# Patient Record
Sex: Male | Born: 1955 | Race: White | Hispanic: No | Marital: Single | Smoking: Former smoker
Health system: Southern US, Community
[De-identification: ages and names within clinical notes are randomized; demographics above are authoritative.]

## PROBLEM LIST (undated history)

## (undated) DIAGNOSIS — C7931 Secondary malignant neoplasm of brain: Secondary | ICD-10-CM

## (undated) DIAGNOSIS — I739 Peripheral vascular disease, unspecified: Secondary | ICD-10-CM

## (undated) DIAGNOSIS — C801 Malignant (primary) neoplasm, unspecified: Secondary | ICD-10-CM

## (undated) DIAGNOSIS — R0600 Dyspnea, unspecified: Secondary | ICD-10-CM

## (undated) DIAGNOSIS — R569 Unspecified convulsions: Secondary | ICD-10-CM

## (undated) DIAGNOSIS — G4730 Sleep apnea, unspecified: Secondary | ICD-10-CM

## (undated) DIAGNOSIS — I10 Essential (primary) hypertension: Secondary | ICD-10-CM

## (undated) DIAGNOSIS — T7840XA Allergy, unspecified, initial encounter: Secondary | ICD-10-CM

## (undated) DIAGNOSIS — Z87442 Personal history of urinary calculi: Secondary | ICD-10-CM

## (undated) DIAGNOSIS — J449 Chronic obstructive pulmonary disease, unspecified: Secondary | ICD-10-CM

## (undated) DIAGNOSIS — R7303 Prediabetes: Secondary | ICD-10-CM

## (undated) DIAGNOSIS — E785 Hyperlipidemia, unspecified: Secondary | ICD-10-CM

## (undated) HISTORY — PX: CERVICAL FUSION: SHX112

## (undated) HISTORY — PX: OTHER SURGICAL HISTORY: SHX169

## (undated) HISTORY — DX: Unspecified convulsions: R56.9

## (undated) HISTORY — PX: CORONARY ANGIOPLASTY: SHX604

## (undated) HISTORY — PX: COLONOSCOPY: SHX174

## (undated) HISTORY — DX: Allergy, unspecified, initial encounter: T78.40XA

---
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
## 2008-02-24 ENCOUNTER — Ambulatory Visit: Payer: Self-pay | Admitting: Internal Medicine

## 2009-05-01 ENCOUNTER — Encounter: Admission: RE | Admit: 2009-05-01 | Discharge: 2009-05-01 | Payer: Self-pay | Admitting: Neurosurgery

## 2009-07-21 HISTORY — PX: BACK SURGERY: SHX140

## 2009-08-07 ENCOUNTER — Inpatient Hospital Stay (HOSPITAL_COMMUNITY): Admission: RE | Admit: 2009-08-07 | Discharge: 2009-08-09 | Payer: Self-pay | Admitting: Neurosurgery

## 2010-08-29 ENCOUNTER — Encounter: Payer: Self-pay | Admitting: Family Medicine

## 2010-08-29 LAB — HM COLONOSCOPY

## 2010-10-06 LAB — URINALYSIS, ROUTINE W REFLEX MICROSCOPIC
Bilirubin Urine: NEGATIVE
Hgb urine dipstick: NEGATIVE
Nitrite: NEGATIVE
Protein, ur: NEGATIVE mg/dL
Urobilinogen, UA: 0.2 mg/dL (ref 0.0–1.0)

## 2010-10-06 LAB — DIFFERENTIAL
Basophils Relative: 1 % (ref 0–1)
Eosinophils Absolute: 0.2 10*3/uL (ref 0.0–0.7)
Lymphs Abs: 2 10*3/uL (ref 0.7–4.0)
Monocytes Relative: 11 % (ref 3–12)
Neutro Abs: 3.3 10*3/uL (ref 1.7–7.7)
Neutrophils Relative %: 54 % (ref 43–77)

## 2010-10-06 LAB — CBC
HCT: 49.5 % (ref 39.0–52.0)
Hemoglobin: 16.6 g/dL (ref 13.0–17.0)
MCHC: 33.5 g/dL (ref 30.0–36.0)
RBC: 5.58 MIL/uL (ref 4.22–5.81)
RDW: 12.9 % (ref 11.5–15.5)
WBC: 6.2 10*3/uL (ref 4.0–10.5)

## 2010-10-06 LAB — PROTIME-INR
INR: 0.92 (ref 0.00–1.49)
Prothrombin Time: 12.3 seconds (ref 11.6–15.2)

## 2010-10-06 LAB — COMPREHENSIVE METABOLIC PANEL
ALT: 29 U/L (ref 0–53)
Alkaline Phosphatase: 85 U/L (ref 39–117)
BUN: 11 mg/dL (ref 6–23)
CO2: 28 mEq/L (ref 19–32)
Calcium: 9.6 mg/dL (ref 8.4–10.5)
Chloride: 104 mEq/L (ref 96–112)
GFR calc non Af Amer: >60 mL/min (ref >60)
Glucose, Bld: 81 mg/dL (ref 70–99)
Sodium: 140 mEq/L (ref 135–145)
Total Bilirubin: 0.5 mg/dL (ref 0.3–1.2)
Total Protein: 7 g/dL (ref 6.0–8.3)

## 2010-10-06 LAB — APTT: aPTT: 31 seconds (ref 24–37)

## 2014-07-03 ENCOUNTER — Ambulatory Visit: Payer: Self-pay | Admitting: Vascular Surgery

## 2014-07-03 LAB — BUN: BUN: 16 mg/dL (ref 7–18)

## 2014-07-03 LAB — CREATININE, SERUM
CREATININE: 1.34 mg/dL — AB (ref 0.60–1.30)
EGFR (Non-African Amer.): 59 — ABNORMAL LOW

## 2014-08-25 DIAGNOSIS — R7303 Prediabetes: Secondary | ICD-10-CM | POA: Insufficient documentation

## 2014-11-11 NOTE — Op Note (Signed)
PATIENT NAME:  Joshua [name], Joshua [name] MR#:  [id number] DATE OF BIRTH:  24-Oct-1955  PREOPERATIVE DIAGNOSES:  1. Peripheral arterial disease with claudication, right lower extremity.  2. Hypertension.   POSTOPERATIVE DIAGNOSES:  1. Peripheral arterial disease with claudication, right lower extremity.  2. Hypertension.   PROCEDURES:  1. Ultrasound guidance for vascular access, left femoral artery.  2. Catheter placement to right popliteal artery from left femoral approach.  3. Aortogram and selective right lower extremity angiogram.  4. Drug-coated angioplasty balloon and conventional angioplasty balloon with 6 mm diameter Lutonix and 7 mm diameter [place] to mid to distal right superficial femoral artery.  5. Self-expanding stent to the right mid to distal superficial femoral artery for greater than 50% residual stenosis and dissection after angioplasty using a 7 mm diameter self-expanding stent.  6. StarClose closure device, left femoral artery.   SURGEON: Algernon Huxley, MD   ANESTHESIA: Local with moderate conscious sedation.   ESTIMATED BLOOD LOSS: Minimal.   INDICATION FOR PROCEDURE: This is a 59 year old gentleman with lifestyle limiting claudication of the right lower extremity. He has an ABI of about 0.5 with a normal left ABI and no significant left lower extremity symptoms. Noninvasive study showed a right SFA occlusion. He desires intervention for his lifestyle limiting symptoms. The risks and benefits were discussed. Informed consent was obtained.   DESCRIPTION OF PROCEDURE: The patient was brought to the vascular suite. Groins were shaved and prepped and a sterile surgical field was created. The left femoral artery was visualized with ultrasound and found to be widely patent. It was then accessed under direct ultrasound guidance without difficulty with a Seldinger needle. The left femoral artery was accessed under direct ultrasound guidance without difficulty with a Seldinger needle and  a permanent image was recorded. A J-wire and 5 French sheath were placed. Pigtail catheter was placed in the aorta at the L1 level and AP aortogram was performed. This showed normal renal arteries. The iliac vessels were not entirely opacified due to spine hardware, but there was brisk flow through the iliac vessels, and previous noninvasive studies had shown no significant stenoses there. I then crossed the aortic bifurcation and advanced to the right femoral head without difficulty and selective right lower extremity angiogram was then performed. This demonstrated a normal common femoral artery, profunda femoris artery. The superficial femoral artery was normal proximally, however, in the mid to distal superficial femoral artery, the artery was occluded with a reconstitution at Hunter's canal distally. There was then 3 vessel runoff distally. I then placed a 6 [place] Ansell sheath over the [employer] wire, and gave the patient 4000 units of intravenous heparin. Using a Kumpe catheter and the Advantage wire, I was able to cross the occlusion without difficulty and confirm intraluminal flow in the popliteal artery below the occlusion. I then replaced the wire. A 6 mm diameter x 15 cm length Lutonix drug-coated angioplasty balloon was inflated across the lesion. When there was a suboptimal result with this, due to the large size of his vessels, I actually upsized to a 7 mm diameter angioplasty balloon. However, at the proximal entry point of the previous occlusion, there was still a dissection with a greater than 50% residual stenosis and flow that did not appear particularly brisk through there. For this reason, I elected to place a stent. A 7 mm diameter x 12 cm length LifeStent was placed in the mid to distal superficial femoral artery and post dilated with a 7 mm balloon with  an excellent angiographic completion result and no significant residual stenosis. At this point, the sheath was pulled back to the  ipsilateral external iliac artery and oblique arteriogram was performed with StarClose closure device deployed in the usual fashion with excellent hemostatic result. The patient tolerated the procedure well and was taken to the recovery room in stable condition.     ____________________________ Algernon Huxley, MD jsd:ap D: 07/03/2014 14:07:58 ET T: 07/03/2014 14:26:04 ET JOB#: [id number]  cc: Algernon Huxley, MD, <Dictator> Algernon Huxley MD ELECTRONICALLY SIGNED 07/20/2014 14:19

## 2015-01-02 ENCOUNTER — Encounter
Admission: AD | Disposition: A | Discharge status: Home / Self Care | Payer: Self-pay | Source: Ambulatory Visit | Attending: Vascular Surgery

## 2015-01-02 ENCOUNTER — Encounter: Payer: Self-pay | Admitting: *Deleted

## 2015-01-02 ENCOUNTER — Observation Stay
Admission: AD | Admit: 2015-01-02 | Discharge: 2015-01-03 | Disposition: A | Discharge status: Home / Self Care | Payer: 59 | Source: Ambulatory Visit | Attending: Vascular Surgery | Admitting: Vascular Surgery

## 2015-01-02 DIAGNOSIS — I998 Other disorder of circulatory system: Secondary | ICD-10-CM | POA: Diagnosis present

## 2015-01-02 DIAGNOSIS — L97419 Non-pressure chronic ulcer of right heel and midfoot with unspecified severity: Secondary | ICD-10-CM | POA: Insufficient documentation

## 2015-01-02 DIAGNOSIS — I70223 Atherosclerosis of native arteries of extremities with rest pain, bilateral legs: Secondary | ICD-10-CM | POA: Diagnosis not present

## 2015-01-02 DIAGNOSIS — I739 Peripheral vascular disease, unspecified: Secondary | ICD-10-CM | POA: Diagnosis present

## 2015-01-02 DIAGNOSIS — T82898A Other specified complication of vascular prosthetic devices, implants and grafts, initial encounter: Secondary | ICD-10-CM | POA: Diagnosis not present

## 2015-01-02 DIAGNOSIS — M79671 Pain in right foot: Secondary | ICD-10-CM | POA: Insufficient documentation

## 2015-01-02 DIAGNOSIS — I10 Essential (primary) hypertension: Secondary | ICD-10-CM | POA: Diagnosis not present

## 2015-01-02 DIAGNOSIS — I743 Embolism and thrombosis of arteries of the lower extremities: Secondary | ICD-10-CM | POA: Insufficient documentation

## 2015-01-02 DIAGNOSIS — X58XXXA Exposure to other specified factors, initial encounter: Secondary | ICD-10-CM | POA: Insufficient documentation

## 2015-01-02 HISTORY — DX: Essential (primary) hypertension: I10

## 2015-01-02 HISTORY — DX: Peripheral vascular disease, unspecified: I73.9

## 2015-01-02 HISTORY — PX: PERIPHERAL VASCULAR CATHETERIZATION: SHX172C

## 2015-01-02 LAB — CREATININE, SERUM
Creatinine, Ser: 1.25 mg/dL — ABNORMAL HIGH (ref 0.61–1.24)
GFR calc non Af Amer: >60 mL/min (ref >60)

## 2015-01-02 LAB — MRSA PCR SCREENING: MRSA BY PCR: NEGATIVE

## 2015-01-02 LAB — GLUCOSE, CAPILLARY: Glucose-Capillary: 88 mg/dL (ref 65–99)

## 2015-01-02 LAB — BUN: BUN: 19 mg/dL (ref 6–20)

## 2015-01-02 SURGERY — LOWER EXTREMITY ANGIOGRAPHY
Anesthesia: Moderate Sedation | Laterality: Right

## 2015-01-02 MED ORDER — MIDAZOLAM HCL 2 MG/2ML IJ SOLN
INTRAMUSCULAR | Status: DC | PRN
Start: 2015-01-02 — End: 2015-01-02
  Administered 2015-01-02: 2 mg via INTRAVENOUS
  Administered 2015-01-02: 1 mg via INTRAVENOUS
  Administered 2015-01-02: 2 mg via INTRAVENOUS
  Administered 2015-01-02 (×2): 1 mg via INTRAVENOUS

## 2015-01-02 MED ORDER — HEPARIN SODIUM (PORCINE) 1000 UNIT/ML IJ SOLN
INTRAMUSCULAR | Status: DC | PRN
  Administered 2015-01-02: 5000 [IU] via INTRAVENOUS

## 2015-01-02 MED ORDER — HEPARIN SODIUM (PORCINE) 1000 UNIT/ML IJ SOLN
INTRAMUSCULAR | Status: AC
  Filled 2015-01-02: qty 1

## 2015-01-02 MED ORDER — DOCUSATE SODIUM 100 MG PO CAPS
100.0000 mg | ORAL_CAPSULE | Freq: Every day | ORAL | Status: DC
  Administered 2015-01-03: 100 mg via ORAL
  Filled 2015-01-02: qty 1

## 2015-01-02 MED ORDER — TIROFIBAN HCL IV 5 MG/100ML
0.0750 ug/kg/min | INTRAVENOUS | Status: DC
  Administered 2015-01-02: 13:00:00 via INTRAVENOUS
  Administered 2015-01-02: 0.075 ug/kg/min via INTRAVENOUS

## 2015-01-02 MED ORDER — POLYETHYLENE GLYCOL 3350 17 G PO PACK: 17.0000 g | PACK | Freq: Every day | ORAL | Status: DC | PRN

## 2015-01-02 MED ORDER — CEFAZOLIN SODIUM 1-5 GM-% IV SOLN
INTRAVENOUS | Status: AC
  Filled 2015-01-02: qty 50

## 2015-01-02 MED ORDER — PANTOPRAZOLE SODIUM 40 MG PO TBEC
40.0000 mg | DELAYED_RELEASE_TABLET | Freq: Every day | ORAL | Status: DC
  Administered 2015-01-02 – 2015-01-03 (×2): 40 mg via ORAL
  Filled 2015-01-02 (×2): qty 1

## 2015-01-02 MED ORDER — PHENOL 1.4 % MT LIQD: 1.0000 | OROMUCOSAL | Status: DC | PRN

## 2015-01-02 MED ORDER — SODIUM CHLORIDE 0.9 % IV SOLN: 500.0000 mL | Freq: Once | INTRAVENOUS | Status: AC | PRN

## 2015-01-02 MED ORDER — HYDROMORPHONE HCL 1 MG/ML IJ SOLN: 1.0000 mg | INTRAMUSCULAR | Status: DC | PRN

## 2015-01-02 MED ORDER — LIDOCAINE HCL (PF) 1 % IJ SOLN
INTRAMUSCULAR | Status: AC
  Filled 2015-01-02: qty 10

## 2015-01-02 MED ORDER — ONDANSETRON HCL 4 MG/2ML IJ SOLN: 4.0000 mg | Freq: Four times a day (QID) | INTRAMUSCULAR | Status: DC | PRN

## 2015-01-02 MED ORDER — HYDROMORPHONE HCL 1 MG/ML IJ SOLN: 0.5000 mg | INTRAMUSCULAR | Status: DC | PRN

## 2015-01-02 MED ORDER — ACETAMINOPHEN 325 MG RE SUPP: 325.0000 mg | RECTAL | Status: DC | PRN

## 2015-01-02 MED ORDER — CLOPIDOGREL BISULFATE 75 MG PO TABS
ORAL_TABLET | ORAL | Status: AC
  Administered 2015-01-02: 75 mg via ORAL
  Filled 2015-01-02: qty 1

## 2015-01-02 MED ORDER — ALUM & MAG HYDROXIDE-SIMETH 200-200-20 MG/5ML PO SUSP: 15.0000 mL | ORAL | Status: DC | PRN

## 2015-01-02 MED ORDER — CLOPIDOGREL BISULFATE 75 MG PO TABS
75.0000 mg | ORAL_TABLET | Freq: Every day | ORAL | Status: DC
  Administered 2015-01-02: 75 mg via ORAL

## 2015-01-02 MED ORDER — TIROFIBAN HCL IV 12.5 MG/250 ML
INTRAVENOUS | Status: AC
  Filled 2015-01-02: qty 250

## 2015-01-02 MED ORDER — LISINOPRIL 10 MG PO TABS
ORAL_TABLET | ORAL | Status: AC
Start: 2015-01-02 — End: 2015-01-02
  Administered 2015-01-02: 10 mg via ORAL
  Filled 2015-01-02: qty 1

## 2015-01-02 MED ORDER — MIDAZOLAM HCL 5 MG/5ML IJ SOLN
INTRAMUSCULAR | Status: AC
  Filled 2015-01-02: qty 5

## 2015-01-02 MED ORDER — LISINOPRIL 10 MG PO TABS
10.0000 mg | ORAL_TABLET | Freq: Every day | ORAL | Status: DC
  Administered 2015-01-02 – 2015-01-03 (×2): 10 mg via ORAL
  Filled 2015-01-02: qty 1

## 2015-01-02 MED ORDER — LIDOCAINE HCL (PF) 1 % IJ SOLN
INTRAMUSCULAR | Status: DC | PRN
  Administered 2015-01-02: 10 mL via SUBCUTANEOUS

## 2015-01-02 MED ORDER — HYDRALAZINE HCL 20 MG/ML IJ SOLN: 5.0000 mg | INTRAMUSCULAR | Status: DC | PRN

## 2015-01-02 MED ORDER — FENTANYL CITRATE (PF) 100 MCG/2ML IJ SOLN
INTRAMUSCULAR | Status: AC
  Filled 2015-01-02: qty 2

## 2015-01-02 MED ORDER — ACETAMINOPHEN 325 MG PO TABS
325.0000 mg | ORAL_TABLET | ORAL | Status: DC | PRN
  Administered 2015-01-02 (×2): 650 mg via ORAL
  Filled 2015-01-02 (×2): qty 2

## 2015-01-02 MED ORDER — GUAIFENESIN-DM 100-10 MG/5ML PO SYRP: 15.0000 mL | ORAL_SOLUTION | ORAL | Status: DC | PRN

## 2015-01-02 MED ORDER — ONDANSETRON HCL 4 MG/2ML IJ SOLN: 4.0000 mg | INTRAMUSCULAR | Status: DC | PRN

## 2015-01-02 MED ORDER — MIDAZOLAM HCL 2 MG/2ML IJ SOLN
INTRAMUSCULAR | Status: AC
  Filled 2015-01-02: qty 2

## 2015-01-02 MED ORDER — MAGNESIUM SULFATE 2 GM/50ML IV SOLN
2.0000 g | Freq: Every day | INTRAVENOUS | Status: DC | PRN
  Filled 2015-01-02: qty 50

## 2015-01-02 MED ORDER — ZOLPIDEM TARTRATE 5 MG PO TABS: 5.0000 mg | ORAL_TABLET | Freq: Every evening | ORAL | Status: DC | PRN

## 2015-01-02 MED ORDER — BISACODYL 5 MG PO TBEC: 5.0000 mg | DELAYED_RELEASE_TABLET | Freq: Every day | ORAL | Status: DC | PRN

## 2015-01-02 MED ORDER — SODIUM CHLORIDE 0.9 % IV SOLN
INTRAVENOUS | Status: DC
  Administered 2015-01-02: 09:00:00 via INTRAVENOUS

## 2015-01-02 MED ORDER — ATROPINE SULFATE 0.1 MG/ML IJ SOLN: 0.5000 mg | INTRAMUSCULAR | Status: DC | PRN

## 2015-01-02 MED ORDER — OXYCODONE HCL 5 MG PO TABS: 5.0000 mg | ORAL_TABLET | ORAL | Status: DC | PRN

## 2015-01-02 MED ORDER — IOHEXOL 350 MG/ML SOLN
INTRAVENOUS | Status: DC | PRN
  Administered 2015-01-02: 100 mL via INTRA_ARTERIAL

## 2015-01-02 MED ORDER — HYDROCHLOROTHIAZIDE 12.5 MG PO CAPS
12.5000 mg | ORAL_CAPSULE | Freq: Every day | ORAL | Status: DC
  Administered 2015-01-02 – 2015-01-03 (×2): 12.5 mg via ORAL
  Filled 2015-01-02 (×2): qty 1

## 2015-01-02 MED ORDER — LABETALOL HCL 5 MG/ML IV SOLN: 10.0000 mg | INTRAVENOUS | Status: DC | PRN

## 2015-01-02 MED ORDER — METOPROLOL TARTRATE 1 MG/ML IV SOLN: 2.0000 mg | INTRAVENOUS | Status: DC | PRN

## 2015-01-02 MED ORDER — POTASSIUM CHLORIDE CRYS ER 20 MEQ PO TBCR: 20.0000 meq | EXTENDED_RELEASE_TABLET | Freq: Every day | ORAL | Status: DC | PRN

## 2015-01-02 MED ORDER — FENTANYL CITRATE (PF) 100 MCG/2ML IJ SOLN
INTRAMUSCULAR | Status: DC | PRN
  Administered 2015-01-02 (×6): 50 ug via INTRAVENOUS

## 2015-01-02 MED ORDER — HEPARIN (PORCINE) IN NACL 2-0.9 UNIT/ML-% IJ SOLN
INTRAMUSCULAR | Status: AC
  Filled 2015-01-02: qty 1000

## 2015-01-02 MED ORDER — CEFAZOLIN SODIUM 1-5 GM-% IV SOLN
1.0000 g | Freq: Once | INTRAVENOUS | Status: AC
  Administered 2015-01-02: 1 g via INTRAVENOUS

## 2015-01-02 SURGICAL SUPPLY — 28 items
BALLN ULTRVRSE 130X300X6 (BALLOONS) ×1
BALLN ULTRVRSE 3X300X150 (BALLOONS) ×2
BALLN ULTRVRSE 6X300X130 (BALLOONS) ×1
BALLN ULTRVRSE 7X200X130 (BALLOONS) ×2
BALLOON LUTONIX DCB 6X60X75 (BALLOONS) ×4 IMPLANT
BALLOON ULTRVRSE 130X300X6 (BALLOONS) ×1 IMPLANT
BALLOON ULTRVRSE 3X300X150 (BALLOONS) ×1 IMPLANT
BALLOON ULTRVRSE 7X200X130 (BALLOONS) ×1 IMPLANT
CATH 5FX125 BOWTIP (CATHETERS) ×2 IMPLANT
CATH CROSS S6 106CM (CATHETERS) ×2 IMPLANT
CATH ROYAL FLUSH PIG 5F 70CM (CATHETERS) ×2 IMPLANT
CATH SKICK ANG 70CM (CATHETERS) ×2 IMPLANT
DEVICE PRESTO INFLATION (MISCELLANEOUS) ×2 IMPLANT
DEVICE SOLENT OMNI 120CM (CATHETERS) ×2 IMPLANT
DEVICE STARCLOSE SE CLOSURE (Vascular Products) ×2 IMPLANT
GLIDEWIRE ANGLED SS 035X260CM (WIRE) ×2 IMPLANT
PACK ANGIOGRAPHY (CUSTOM PROCEDURE TRAY) ×2 IMPLANT
SET INTRO CAPELLA COAXIAL (SET/KITS/TRAYS/PACK) ×2 IMPLANT
SHEATH BRITE TIP 5FRX11 (SHEATH) ×2 IMPLANT
SHEATH RAABE 6FR (SHEATH) ×2 IMPLANT
SHEATH RAABE 7FR (SHEATH) ×2 IMPLANT
STENT VIABAHN 7X25X120 (Permanent Stent) ×2 IMPLANT
SYR MEDRAD MARK V 150ML (SYRINGE) ×2 IMPLANT
TUBING CONTRAST HIGH PRESS 72 (TUBING) ×2 IMPLANT
VALVE HEMO TOUHY BORST Y (VALVE) ×2 IMPLANT
WIRE HI TORQ VERSACORE 300 (WIRE) ×2 IMPLANT
WIRE J 3MM .035X145CM (WIRE) ×2 IMPLANT
WIRE SPARTACORE .014X300CM (WIRE) ×2 IMPLANT

## 2015-01-02 NOTE — Progress Notes (Signed)
Pt alert and oriented, complaint of leg pain relieved with tylenol, pt currently resting quietly and asleep.  NSR/SB, lungs clear on room air.  Tolerating clear liquids, voids in urinal.  VSS, afebrile.  Incision site to left groin dry and intact, PAD remains in place with 40 ml air, PAD to remain in place overnight until tomorrow morning.  Pt has aggrastat drip infusing, to remain infusing until tomorrow morning.  Able to palpate left pedal pulses, doppler right pedal pulses.

## 2015-01-02 NOTE — Plan of Care (Signed)
Problem: Consults Goal: Skin Care Protocol Initiated - if Braden Score 18 or less If consults are not indicated, leave blank or document N/A Outcome: Completed/Met Date Met:  01/02/15 Braden scale 19

## 2015-01-02 NOTE — Progress Notes (Signed)
Night of surgery note  Patient is comfortable in the ICU intravenous infusion of Aggrastat is ongoing without problems. Vital signs are stable. By examination his foot is pink and hyperemic there are weakly palpable dorsalis pedis and posterior tibial pulses noted.  Status post revascularization right lower extremity successful.  Plan will be to complete the infusion of Aggrastat and discharged tomorrow on Plavix and aspirin

## 2015-01-03 DIAGNOSIS — I743 Embolism and thrombosis of arteries of the lower extremities: Secondary | ICD-10-CM | POA: Diagnosis present

## 2015-01-03 DIAGNOSIS — I70223 Atherosclerosis of native arteries of extremities with rest pain, bilateral legs: Secondary | ICD-10-CM | POA: Diagnosis not present

## 2015-01-03 MED ORDER — CLOPIDOGREL BISULFATE 75 MG PO TABS
300.0000 mg | ORAL_TABLET | ORAL | Status: AC
  Administered 2015-01-03: 300 mg via ORAL
  Filled 2015-01-03: qty 4

## 2015-01-03 NOTE — Discharge Summary (Signed)
[facility]    Discharge Summary    Patient ID:  Joshua [name] MRN: [id number] DOB/AGE: 10/17/1955 59 y.o.  Admit date: 01/02/2015 Discharge date: 01/03/2015 Date of Surgery: 01/02/2015 Surgeon: Surgeon(s): Katha Cabal, MD  Admission Diagnosis: RT lower extrem    ASO w claudication and rest pain right leg  Discharge Diagnoses:  RT lower extrem    ASO w claudication and rest pain right leg  Secondary Diagnoses: Past Medical History  Diagnosis Date  . Hypertension   . Peripheral vascular disease     Procedure(s): Right Lower Extremity Angiography with Crosser atherectomy and percutaneous transluminal angioplasty and stent placement right SFA associated with mechanical thromboembolectomy of the distal popliteal tibioperoneal trunk and peroneal and anterior tibial  Discharged Condition: good  HPI:  Patient presents with claudication rest pain of his right lower extremity. On evaluation in the office he was noted to have loss of palpable pulses in his right foot noninvasive studies demonstrated occlusion of the previously placed stent. Given his increasing pain and inability to do his job he presents for revascularization. The risks and benefits were reviewed all questions were answered alternative therapies and treatments were reviewed patient wishes to proceed with angiography and intervention.  Hospital Course:  Joshua [name] is a 59 y.o. male is S/P Right Procedure(s): Right Lower Extremity Angiography with Crosser atherectomy and percutaneous transluminal angioplasty and stent placement right SFA associated with mechanical thromboembolectomy of the distal popliteal tibioperoneal trunk and peroneal and anterior tibial Extubated: POD # 0 Physical exam: Patient now has a pink warm right foot with a 2+ posterior tibial pulse Post-op wounds clean, dry, intact or healing well Pt. Ambulating, voiding and taking PO diet without  difficulty. Pt pain controlled with PO pain meds. Labs as below Complications: During the procedure the patient embolized thrombus from the SFA distally this was treated effectively with mechanical thrombectomy using the AngioJet. He was subsequently maintained on Aggrastat overnight and is discharged home on Plavix and aspirin the day after the procedure  Consults: None     Significant Diagnostic Studies: CBC Lab Results  Component Value Date   WBC 6.2 08/02/2009   HGB 16.6 08/02/2009   HCT 49.5 08/02/2009   MCV 88.8 08/02/2009   PLT 170 08/02/2009    BMET    Component Value Date/Time   NA 140 08/02/2009 1129   K 4.3 08/02/2009 1129   CL 104 08/02/2009 1129   CO2 28 08/02/2009 1129   GLUCOSE 81 08/02/2009 1129   BUN 19 01/02/2015 0908   BUN 16 07/03/2014 1205   CREATININE 1.25* 01/02/2015 0908   CREATININE 1.34* 07/03/2014 1205   CALCIUM 9.6 08/02/2009 1129   GFRNONAA >60 01/02/2015 0908   GFRAA >60 01/02/2015 0908   COAG Lab Results  Component Value Date   INR 0.92 08/02/2009     Disposition:  Discharge to :Home    Medication List    TAKE these medications        clopidogrel 75 MG tablet  Commonly known as:  PLAVIX  Take 75 mg by mouth daily.     hydrochlorothiazide 12.5 MG capsule  Commonly known as:  MICROZIDE  Take 12.5 mg by mouth daily.     lisinopril 10 MG tablet  Commonly known as:  PRINIVIL,ZESTRIL  Take 10 mg by mouth daily.       Verbal and written Discharge instructions given to the patient. Wound care per Discharge AVS     Follow-up  Information    Follow up with Shannia Jacuinde, Dolores Lory, MD In 3 weeks.   Specialties:  Vascular Surgery, Cardiology, Radiology, Vascular Surgery   Contact information:   [address] [place] [address] [phone]       Signed: Katha Cabal, MD  01/03/2015, 10:46 AM

## 2015-01-03 NOTE — Discharge Instructions (Signed)
1. Keep the site clean and dry with a band aide daily until it has healed. 2. Gradually increase your activity as tolerated. 3. No strenuous exercise or lifting over 10 pounds for one week 4. No driving for 24 hours 5. Avoid tub baths, hot tubs or swimming for one week. Showers are okay. 6. A bruise or small lump at the site is common. It should disappear in a few weeks. 7. Some mild discomfort at the site is common during the healing process. 8. If you bleed at the site hold firm pressure and if it does not stop call physicians office or call 911. 9. If you have severe pain, develop fever, numbness in either leg or of it becomes cold or bluish in color, call the physicians office.   Dr. Nino Parsley office number is [phone]

## 2015-01-03 NOTE — Progress Notes (Signed)
Reviewed all discharge instructions with patient and his mother at bedside. Reviewed prescriptions, follow-up appointment, and groin care instructions. Patient and mother verbalized understanding of all instructions and provided him with educational materials. IV removed, catheter intact and dressing applied, dressing to left groin also changed. No active bleeding or hematoma noted to site. Calling for wheelchair to front lobby for discharge home.

## 2015-01-03 NOTE — Plan of Care (Signed)
Problem: Consults Goal: Vascular Cath Int Patient Education (See Patient Education module for education specifics.)  Outcome: Progressing Educated patient on groin care when he goes home. No tub baths, pools, or hot tubs for a few weeks until the site has healed. He verbalized understanding and will review this information with him when he is discharged home.   Problem: Phase I Progression Outcomes Goal: Vascular site scale level 0 - I Vascular Site Scale Level 0: No bruising/bleeding/hematoma Level I (Mild): Bruising/Ecchymosis, minimal bleeding/ooozing, palpable hematoma < 3 cm Level II (Moderate): Bleeding not affecting hemodynamic parameters, pseudoaneurysm, palpable hematoma > 3 cm Level III (Severe) Bleeding which affects hemodynamic parameters or retroperitoneal hemorrhage  Outcome: Progressing Site is clean, dry, and intact with no noted hematoma or bleeding. Will continue to monitor.  Goal: Initial discharge plan identified Outcome: Progressing Discussed plans for discharge and the importance of taking his prescribed medications.

## 2015-01-03 NOTE — H&P (Signed)
[facility] History & Physical Update  The patient was interviewed and re-examined.  The patient's previous History and Physical has been reviewed and is unchanged.  There is no change in the plan of care.  Schnier, Dolores Lory, MD  01/03/2015, 11:27 AM

## 2015-01-05 ENCOUNTER — Encounter: Payer: Self-pay | Admitting: Vascular Surgery

## 2015-06-13 ENCOUNTER — Other Ambulatory Visit: Payer: Self-pay | Admitting: Vascular Surgery

## 2015-06-18 ENCOUNTER — Encounter: Payer: Self-pay | Admitting: *Deleted

## 2015-06-18 ENCOUNTER — Ambulatory Visit
Admission: RE | Admit: 2015-06-18 | Discharge: 2015-06-18 | Disposition: A | Discharge status: Home / Self Care | Payer: 59 | Source: Ambulatory Visit | Attending: Vascular Surgery | Admitting: Vascular Surgery

## 2015-06-18 ENCOUNTER — Encounter
Admission: RE | Disposition: A | Discharge status: Home / Self Care | Payer: Self-pay | Source: Ambulatory Visit | Attending: Vascular Surgery

## 2015-06-18 DIAGNOSIS — I10 Essential (primary) hypertension: Secondary | ICD-10-CM | POA: Insufficient documentation

## 2015-06-18 DIAGNOSIS — Z79899 Other long term (current) drug therapy: Secondary | ICD-10-CM | POA: Diagnosis not present

## 2015-06-18 DIAGNOSIS — Z7982 Long term (current) use of aspirin: Secondary | ICD-10-CM | POA: Diagnosis not present

## 2015-06-18 DIAGNOSIS — I70211 Atherosclerosis of native arteries of extremities with intermittent claudication, right leg: Secondary | ICD-10-CM | POA: Insufficient documentation

## 2015-06-18 DIAGNOSIS — F17200 Nicotine dependence, unspecified, uncomplicated: Secondary | ICD-10-CM | POA: Diagnosis not present

## 2015-06-18 HISTORY — PX: PERIPHERAL VASCULAR CATHETERIZATION: SHX172C

## 2015-06-18 LAB — BUN: BUN: 17 mg/dL (ref 6–20)

## 2015-06-18 LAB — CREATININE, SERUM
Creatinine, Ser: 1.14 mg/dL (ref 0.61–1.24)
GFR calc Af Amer: >60 mL/min (ref >60)

## 2015-06-18 SURGERY — LOWER EXTREMITY ANGIOGRAPHY
Anesthesia: Moderate Sedation | Laterality: Right

## 2015-06-18 MED ORDER — FENTANYL CITRATE (PF) 100 MCG/2ML IJ SOLN
INTRAMUSCULAR | Status: AC
  Filled 2015-06-18: qty 2

## 2015-06-18 MED ORDER — ONDANSETRON 4 MG PO TBDP
ORAL_TABLET | ORAL | Status: AC
  Filled 2015-06-18: qty 1

## 2015-06-18 MED ORDER — SODIUM CHLORIDE 0.9 % IV SOLN
INTRAVENOUS | Status: DC
  Administered 2015-06-18: 12:00:00 via INTRAVENOUS

## 2015-06-18 MED ORDER — LIDOCAINE HCL (PF) 1 % IJ SOLN
INTRAMUSCULAR | Status: AC
  Filled 2015-06-18: qty 30

## 2015-06-18 MED ORDER — FENTANYL CITRATE (PF) 100 MCG/2ML IJ SOLN
INTRAMUSCULAR | Status: DC | PRN
  Administered 2015-06-18: 50 ug via INTRAVENOUS
  Administered 2015-06-18: 25 ug via INTRAVENOUS
  Administered 2015-06-18 (×2): 50 ug via INTRAVENOUS

## 2015-06-18 MED ORDER — LIDOCAINE-EPINEPHRINE (PF) 1 %-1:200000 IJ SOLN
INTRAMUSCULAR | Status: DC | PRN
  Administered 2015-06-18: 10 mL via INTRADERMAL

## 2015-06-18 MED ORDER — MIDAZOLAM HCL 5 MG/5ML IJ SOLN
INTRAMUSCULAR | Status: AC
  Filled 2015-06-18: qty 5

## 2015-06-18 MED ORDER — HYDROCODONE-ACETAMINOPHEN 5-325 MG PO TABS: 1.0000 | ORAL_TABLET | Freq: Four times a day (QID) | ORAL | Status: DC | PRN

## 2015-06-18 MED ORDER — MIDAZOLAM HCL 2 MG/2ML IJ SOLN
INTRAMUSCULAR | Status: DC | PRN
  Administered 2015-06-18: 2 mg via INTRAVENOUS
  Administered 2015-06-18 (×2): 1 mg via INTRAVENOUS
  Administered 2015-06-18: 2 mg via INTRAVENOUS

## 2015-06-18 MED ORDER — ONDANSETRON HCL 4 MG PO TABS
4.0000 mg | ORAL_TABLET | Freq: Once | ORAL | Status: AC
  Administered 2015-06-18: 4 mg via ORAL

## 2015-06-18 MED ORDER — HEPARIN SODIUM (PORCINE) 1000 UNIT/ML IJ SOLN
INTRAMUSCULAR | Status: DC | PRN
  Administered 2015-06-18: 5000 [IU] via INTRAVENOUS

## 2015-06-18 MED ORDER — DEXTROSE 5 % IV SOLN
1.5000 g | INTRAVENOUS | Status: AC
  Administered 2015-06-18: 1.5 g via INTRAVENOUS
  Filled 2015-06-18: qty 1.5

## 2015-06-18 MED ORDER — IOHEXOL 300 MG/ML  SOLN
INTRAMUSCULAR | Status: DC | PRN
  Administered 2015-06-18: 85 mL via INTRA_ARTERIAL

## 2015-06-18 MED ORDER — APIXABAN 2.5 MG PO TABS
2.5000 mg | ORAL_TABLET | Freq: Two times a day (BID) | ORAL | Status: DC
  Administered 2015-06-18: 2.5 mg via ORAL
  Filled 2015-06-18 (×5): qty 1

## 2015-06-18 MED ORDER — SIMVASTATIN 20 MG PO TABS: 20.0000 mg | ORAL_TABLET | Freq: Every day | ORAL | Status: DC

## 2015-06-18 MED ORDER — HEPARIN SODIUM (PORCINE) 1000 UNIT/ML IJ SOLN
INTRAMUSCULAR | Status: AC
  Filled 2015-06-18: qty 1

## 2015-06-18 MED ORDER — HEPARIN (PORCINE) IN NACL 2-0.9 UNIT/ML-% IJ SOLN
INTRAMUSCULAR | Status: AC
  Filled 2015-06-18: qty 1000

## 2015-06-18 SURGICAL SUPPLY — 22 items
BALLN LUTONIX 5X150X130 (BALLOONS) ×3
BALLN ULTRVRSE 130X300X6 (BALLOONS) ×2
BALLN ULTRVRSE 3.5X100X150 (BALLOONS) ×3
BALLN ULTRVRSE 6X300X130 (BALLOONS) ×1
BALLOON LUTONIX 5X150X130 (BALLOONS) ×2 IMPLANT
BALLOON ULTRVRSE 130X300X6 (BALLOONS) ×2 IMPLANT
BALLOON ULTRVRSE 3.5X100X150 (BALLOONS) ×2 IMPLANT
CATH PIG 70CM (CATHETERS) ×3 IMPLANT
CATH VERT 100CM (CATHETERS) ×3 IMPLANT
DEVICE PRESTO INFLATION (MISCELLANEOUS) ×3 IMPLANT
DEVICE SOLENT OMNI 120CM (CATHETERS) ×3 IMPLANT
DEVICE STARCLOSE SE CLOSURE (Vascular Products) ×3 IMPLANT
GLIDEWIRE ADV .035X260CM (WIRE) ×3 IMPLANT
PACK ANGIOGRAPHY (CUSTOM PROCEDURE TRAY) ×3 IMPLANT
SHEATH ANL2 6FRX45 HC (SHEATH) ×3 IMPLANT
SHEATH BRITE TIP 5FRX11 (SHEATH) ×3 IMPLANT
STENT VIABAHN 6X100X120 (Permanent Stent) ×3 IMPLANT
STENT VIABAHN 6X250X120 (Permanent Stent) ×3 IMPLANT
SYR MEDRAD MARK V 150ML (SYRINGE) ×3 IMPLANT
TUBING CONTRAST HIGH PRESS 72 (TUBING) ×3 IMPLANT
WIRE G V18X300CM (WIRE) ×3 IMPLANT
WIRE J 3MM .035X145CM (WIRE) ×3 IMPLANT

## 2015-06-18 NOTE — H&P (Signed)
  [facility] History & Physical Update  The patient was interviewed and re-examined.  The patient's previous History and Physical has been reviewed and is unchanged.  There is no change in the plan of care. We plan to proceed with the scheduled procedure.  Vitoria Conyer, MD  06/18/2015, 12:20 PM

## 2015-06-18 NOTE — Op Note (Signed)
Joshua [name] Percutaneous Study/Intervention Procedural Note   Date of Surgery: 06/18/2015  Surgeon(s):Kanylah Muench   Assistants:none  Pre-operative Diagnosis: PAD with short distance claudication and acute on chronic lower extremity ischemia right lower extremity  Post-operative diagnosis: Same  Procedure(s) Performed: 1. Ultrasound guidance for vascular access left femoral artery 2. Catheter placement into right peroneal artery from left femoral approach 3. Aortogram and selective right lower extremity angiogram  4.  Catheter directed thrombolyzes with 4 mg of TPA delivered with the AngioJet Omni catheter and a power pulse spray fashion to the right SFA and popliteal arteries  5.  Mechanical rheolytic thrombectomy with the AngioJet Omni catheter to the right SFA and popliteal arteries 6. Percutaneous transluminal angioplasty of the right SFA with 6 mm diameter angioplasty balloon in the right popliteal artery with 5 mm diameter by 15 cm length Lutonix drug-coated angioplasty balloon 7. Viabahn covered stent placement 2 to the right SFA and popliteal artery for thrombus and high-grade stenosis after angioplasty using a 6 mm diameter by 25 cm length stent and a 6 mm diameter by 10 cm length stent  8.  Percutaneous transluminal angioplasty of right tibioperoneal trunk and proximal peroneal artery with 3.5 mm diameter by 10 cm length angioplasty balloon 9. StarClose closure device left femoral artery  EBL: 25 cc  Indications: Patient is a 59 year old male with recurrent right leg pain and acute on chronic right lower extremity ischemia. The patient has noninvasive study showing reocclusion of his right SFA and popliteal artery interventions. The patient is brought in for angiography for further evaluation and potential treatment. Risks and benefits are discussed and informed  consent is obtained  Procedure: The patient was identified and appropriate procedural time out was performed. The patient was then placed supine on the table and prepped and draped in the usual sterile fashion. Ultrasound was used to evaluate the left common femoral artery. It was patent . A digital ultrasound image was acquired. A Seldinger needle was used to access the left common femoral artery under direct ultrasound guidance and a permanent image was performed. A 0.035 J wire was advanced without resistance and a 5Fr sheath was placed. Pigtail catheter was placed into the aorta and an AP aortogram was performed. This demonstrated normal renal arteries and normal aorta and iliac segments without significant stenosis. I then crossed the aortic bifurcation and advanced to the right femoral head and then into the proximal superficial femoral artery. Selective right lower extremity angiogram was then performed. This demonstrated occlusion of the SFA about 6-7 cm beyond its origin with reconstitution of the popliteal artery just above the knee from acute thrombosis. The patient was systemically heparinized and a 6 [place] Ansell sheath was then placed over the [employer] wire. I then used a Kumpe catheter and the advantage wire to navigate through the occlusion with no difficulty at all. Intraluminal flow was confirmed in the popliteal artery. I then replaced a 0.035 advantage wire and used the AngioJet Omni catheter to deliver 4 mg of TPA and a power pulse spray fashion to the SFA and popliteal artery. This was allowed to dwell for approximately 15 minutes. Angioplasty of the popliteal artery was performed with a 5 mm diameter by 15 cm length Lutonix drug-coated angioplasty balloon inflated to 10 atm for 1 minute. The SFA was treated with a 6 mm diameter by 30 cm length conventional angioplasty balloon inflated to 8 atm for 1 minute. Significant residual thrombus remained particularly at the proximal  and distal aspects of the previous stents and the areas just above and below the previous stents. Mechanical rheolytic thrombectomy was then performed throughout the SFA and popliteal arteries for approximately 191 cc of effluent returned with the AngioJet Omni catheter. Nonetheless, residual thrombus and stenosis remained. In addition, there is now thrombus at the tibioperoneal trunk. I treated the tibioperoneal trunk thrombus with a 3.5 mm diameter by 10 cm length angioplasty balloon inflated in the proximal peroneal artery and tibioperoneal trunk. This was after exchanging for a 0.018 wire. This resulted in resolution of the thrombus with markedly improved flow and two-vessel runoff distally. The SFA and popliteal artery residual disease was then treated with 2 Viabahn covered stents. The first was a 6 mm diameter by 25 7 m length stent and the second was a 6 mm diameter by 10 cm length stent and this band the SFA from approximately 3-4 cm beyond its origin down to the above-knee popliteal artery just above the knee. These were postdilated with 6 mm balloon with an excellent angiographic completion result and no significant residual stenosis. I elected to terminate the procedure. The sheath was removed and StarClose closure device was deployed in the left femoral artery with excellent hemostatic result. The patient was taken to the recovery room in stable condition having tolerated the procedure well.  Findings:  Aortogram:Normal aorta and iliac arteries and no significant renal artery stenosis. right Lower Extremity: Long SFA and popliteal occlusion with thrombus to the tibial vessels as well. Markedly improved after intervention.   Disposition: Patient was taken to the recovery room in stable condition having tolerated the procedure well.  Complications: None  Joshua [name] 06/18/2015 3:00 PM

## 2015-06-18 NOTE — Discharge Instructions (Signed)

## 2015-06-19 ENCOUNTER — Encounter: Payer: Self-pay | Admitting: Vascular Surgery

## 2015-10-08 ENCOUNTER — Other Ambulatory Visit
Admission: RE | Admit: 2015-10-08 | Discharge: 2015-10-08 | Disposition: A | Discharge status: Home / Self Care | Payer: BLUE CROSS/BLUE SHIELD | Source: Ambulatory Visit | Attending: Vascular Surgery | Admitting: Vascular Surgery

## 2015-10-08 ENCOUNTER — Other Ambulatory Visit: Payer: Self-pay | Admitting: Vascular Surgery

## 2015-10-08 DIAGNOSIS — I739 Peripheral vascular disease, unspecified: Secondary | ICD-10-CM | POA: Insufficient documentation

## 2015-10-08 LAB — CREATININE, SERUM
CREATININE: 1.23 mg/dL (ref 0.61–1.24)
GFR calc non Af Amer: >60 mL/min (ref >60)

## 2015-10-08 LAB — BUN: BUN: 19 mg/dL (ref 6–20)

## 2015-10-09 ENCOUNTER — Encounter: Payer: Self-pay | Admitting: Anesthesiology

## 2015-10-09 ENCOUNTER — Inpatient Hospital Stay
Admission: AD | Admit: 2015-10-09 | Discharge: 2015-10-12 | DRG: 271 | Disposition: A | Discharge status: Home / Self Care | Payer: BLUE CROSS/BLUE SHIELD | Source: Ambulatory Visit | Attending: Vascular Surgery | Admitting: Vascular Surgery

## 2015-10-09 ENCOUNTER — Encounter
Admission: AD | Disposition: A | Discharge status: Home / Self Care | Payer: Self-pay | Source: Ambulatory Visit | Attending: Vascular Surgery

## 2015-10-09 ENCOUNTER — Encounter: Payer: Self-pay | Admitting: *Deleted

## 2015-10-09 DIAGNOSIS — F17200 Nicotine dependence, unspecified, uncomplicated: Secondary | ICD-10-CM | POA: Diagnosis present

## 2015-10-09 DIAGNOSIS — I743 Embolism and thrombosis of arteries of the lower extremities: Secondary | ICD-10-CM | POA: Diagnosis present

## 2015-10-09 DIAGNOSIS — Z7982 Long term (current) use of aspirin: Secondary | ICD-10-CM

## 2015-10-09 DIAGNOSIS — I70221 Atherosclerosis of native arteries of extremities with rest pain, right leg: Principal | ICD-10-CM | POA: Diagnosis present

## 2015-10-09 DIAGNOSIS — I998 Other disorder of circulatory system: Secondary | ICD-10-CM | POA: Diagnosis present

## 2015-10-09 DIAGNOSIS — Z79899 Other long term (current) drug therapy: Secondary | ICD-10-CM | POA: Diagnosis not present

## 2015-10-09 DIAGNOSIS — Z8249 Family history of ischemic heart disease and other diseases of the circulatory system: Secondary | ICD-10-CM

## 2015-10-09 DIAGNOSIS — Z833 Family history of diabetes mellitus: Secondary | ICD-10-CM

## 2015-10-09 DIAGNOSIS — I10 Essential (primary) hypertension: Secondary | ICD-10-CM | POA: Diagnosis present

## 2015-10-09 DIAGNOSIS — Z7901 Long term (current) use of anticoagulants: Secondary | ICD-10-CM | POA: Diagnosis not present

## 2015-10-09 HISTORY — PX: PERIPHERAL VASCULAR CATHETERIZATION: SHX172C

## 2015-10-09 LAB — CBC
HCT: 39.8 % — ABNORMAL LOW (ref 40.0–52.0)
HEMATOCRIT: 39.2 % — AB (ref 40.0–52.0)
HEMOGLOBIN: 13.2 g/dL (ref 13.0–18.0)
Hemoglobin: 13 g/dL (ref 13.0–18.0)
MCH: 28.5 pg (ref 26.0–34.0)
MCH: 28.6 pg (ref 26.0–34.0)
MCHC: 33.1 g/dL (ref 32.0–36.0)
MCHC: 33.1 g/dL (ref 32.0–36.0)
MCV: 85.9 fL (ref 80.0–100.0)
MCV: 86.5 fL (ref 80.0–100.0)
PLATELETS: 144 10*3/uL — AB (ref 150–440)
Platelets: 140 10*3/uL — ABNORMAL LOW (ref 150–440)
RBC: 4.63 MIL/uL (ref 4.40–5.90)
RBC: 4.53 MIL/uL (ref 4.40–5.90)
RDW: 13.4 % (ref 11.5–14.5)
RDW: 13 % (ref 11.5–14.5)
WBC: 6.8 10*3/uL (ref 3.8–10.6)
WBC: 8.1 10*3/uL (ref 3.8–10.6)

## 2015-10-09 LAB — FIBRINOGEN
Fibrinogen: 255 mg/dL (ref 210–470)
Fibrinogen: 229 mg/dL (ref 210–470)

## 2015-10-09 LAB — PROTIME-INR
INR: 1.15
PROTHROMBIN TIME: 14.9 s (ref 11.4–15.0)

## 2015-10-09 LAB — HEPARIN LEVEL (UNFRACTIONATED)

## 2015-10-09 LAB — APTT: aPTT: 40 seconds — ABNORMAL HIGH (ref 24–36)

## 2015-10-09 LAB — MRSA PCR SCREENING: MRSA by PCR: NEGATIVE

## 2015-10-09 SURGERY — ABDOMINAL AORTOGRAM W/LOWER EXTREMITY
Laterality: Right | Wound class: Clean

## 2015-10-09 MED ORDER — ONDANSETRON HCL 4 MG/2ML IJ SOLN: 4.0000 mg | Freq: Four times a day (QID) | INTRAMUSCULAR | Status: DC | PRN

## 2015-10-09 MED ORDER — ALTEPLASE 2 MG IJ SOLR
INTRAMUSCULAR | Status: DC | PRN
  Administered 2015-10-09: 8 mg

## 2015-10-09 MED ORDER — SIMVASTATIN 20 MG PO TABS
20.0000 mg | ORAL_TABLET | Freq: Every day | ORAL | Status: DC
Start: 2015-10-10 — End: 2015-10-12
  Administered 2015-10-10 – 2015-10-11 (×2): 20 mg via ORAL
  Filled 2015-10-09 (×2): qty 1

## 2015-10-09 MED ORDER — MIDAZOLAM HCL 5 MG/ML IJ SOLN
1.0000 mg | Freq: Once | INTRAMUSCULAR | Status: AC
  Administered 2015-10-09: 1 mg via INTRAVENOUS

## 2015-10-09 MED ORDER — SODIUM CHLORIDE 0.9% FLUSH: 3.0000 mL | INTRAVENOUS | Status: DC | PRN

## 2015-10-09 MED ORDER — ALTEPLASE 2 MG IJ SOLR
INTRAMUSCULAR | Status: AC
  Filled 2015-10-09: qty 8

## 2015-10-09 MED ORDER — DEXTROSE 5 % IV SOLN
1.5000 g | INTRAVENOUS | Status: AC
  Administered 2015-10-09: 1.5 g via INTRAVENOUS

## 2015-10-09 MED ORDER — HEPARIN SODIUM (PORCINE) 1000 UNIT/ML IJ SOLN
INTRAMUSCULAR | Status: AC
  Filled 2015-10-09: qty 1

## 2015-10-09 MED ORDER — MORPHINE SULFATE (PF) 2 MG/ML IV SOLN
INTRAVENOUS | Status: AC
  Administered 2015-10-09: 2 mg via INTRAVENOUS
  Filled 2015-10-09: qty 1

## 2015-10-09 MED ORDER — HYDROMORPHONE HCL 1 MG/ML IJ SOLN: 1.0000 mg | Freq: Once | INTRAMUSCULAR | Status: DC

## 2015-10-09 MED ORDER — HEPARIN SODIUM (PORCINE) 1000 UNIT/ML IJ SOLN
INTRAMUSCULAR | Status: DC | PRN
  Administered 2015-10-09: 2000 [IU] via INTRAVENOUS
  Administered 2015-10-09: 5000 [IU] via INTRAVENOUS

## 2015-10-09 MED ORDER — MIDAZOLAM HCL 2 MG/2ML IJ SOLN
INTRAMUSCULAR | Status: AC
  Filled 2015-10-09: qty 2

## 2015-10-09 MED ORDER — CEFUROXIME SODIUM 1.5 G IJ SOLR
INTRAMUSCULAR | Status: AC
  Filled 2015-10-09 (×33): qty 1.5

## 2015-10-09 MED ORDER — HEPARIN (PORCINE) IN NACL 2-0.9 UNIT/ML-% IJ SOLN
INTRAMUSCULAR | Status: AC
  Filled 2015-10-09: qty 1000

## 2015-10-09 MED ORDER — MIDAZOLAM HCL 5 MG/5ML IJ SOLN
INTRAMUSCULAR | Status: AC
  Filled 2015-10-09: qty 5

## 2015-10-09 MED ORDER — FENTANYL CITRATE (PF) 100 MCG/2ML IJ SOLN
INTRAMUSCULAR | Status: AC
  Filled 2015-10-09: qty 2

## 2015-10-09 MED ORDER — FENTANYL CITRATE (PF) 100 MCG/2ML IJ SOLN
INTRAMUSCULAR | Status: AC
Start: 2015-10-09 — End: 2015-10-09
  Filled 2015-10-09: qty 2

## 2015-10-09 MED ORDER — SODIUM CHLORIDE 0.9% FLUSH
10.0000 mL | Freq: Two times a day (BID) | INTRAVENOUS | Status: DC
  Administered 2015-10-09 – 2015-10-10 (×2): 10 mL
  Administered 2015-10-11: 20 mL
  Administered 2015-10-11 – 2015-10-12 (×2): 10 mL

## 2015-10-09 MED ORDER — SODIUM CHLORIDE 0.9 % IV SOLN: 250.0000 mL | INTRAVENOUS | Status: DC | PRN

## 2015-10-09 MED ORDER — SODIUM CHLORIDE 0.9 % IV SOLN
INTRAVENOUS | Status: DC
  Administered 2015-10-10: 08:00:00 via INTRAVENOUS

## 2015-10-09 MED ORDER — MIDAZOLAM HCL 2 MG/2ML IJ SOLN
INTRAMUSCULAR | Status: DC | PRN
  Administered 2015-10-09 (×4): 1 mg via INTRAVENOUS
  Administered 2015-10-09: 2 mg via INTRAVENOUS
  Administered 2015-10-09 (×3): 1 mg via INTRAVENOUS

## 2015-10-09 MED ORDER — SODIUM CHLORIDE 0.9% FLUSH
3.0000 mL | Freq: Two times a day (BID) | INTRAVENOUS | Status: DC
  Administered 2015-10-09 – 2015-10-11 (×3): 3 mL via INTRAVENOUS

## 2015-10-09 MED ORDER — SODIUM CHLORIDE FLUSH 0.9 % IV SOLN
INTRAVENOUS | Status: AC
  Filled 2015-10-09: qty 50

## 2015-10-09 MED ORDER — HEPARIN (PORCINE) IN NACL 100-0.45 UNIT/ML-% IJ SOLN: 1350.0000 [IU]/h | INTRAMUSCULAR | Status: DC

## 2015-10-09 MED ORDER — SODIUM CHLORIDE 0.9 % IV SOLN
5.0000 mg | Freq: Once | INTRAVENOUS | Status: DC
  Filled 2015-10-09: qty 5

## 2015-10-09 MED ORDER — LISINOPRIL 10 MG PO TABS
10.0000 mg | ORAL_TABLET | Freq: Every day | ORAL | Status: DC
  Administered 2015-10-11 – 2015-10-12 (×2): 10 mg via ORAL
  Filled 2015-10-09 (×2): qty 1

## 2015-10-09 MED ORDER — FAMOTIDINE 20 MG PO TABS: 40.0000 mg | ORAL_TABLET | ORAL | Status: DC | PRN

## 2015-10-09 MED ORDER — FENTANYL CITRATE (PF) 100 MCG/2ML IJ SOLN
INTRAMUSCULAR | Status: DC | PRN
  Administered 2015-10-09 (×8): 50 ug via INTRAVENOUS

## 2015-10-09 MED ORDER — METHYLPREDNISOLONE SODIUM SUCC 125 MG IJ SOLR: 125.0000 mg | INTRAMUSCULAR | Status: DC | PRN

## 2015-10-09 MED ORDER — MIDAZOLAM HCL 2 MG/2ML IJ SOLN: 1.0000 mg | INTRAMUSCULAR | Status: DC | PRN

## 2015-10-09 MED ORDER — SODIUM CHLORIDE 0.9 % IV SOLN
5.0000 mg | INTRAVENOUS | Status: DC
  Administered 2015-10-09 – 2015-10-10 (×3): 5 mg via INTRAVENOUS
  Filled 2015-10-09 (×4): qty 5

## 2015-10-09 MED ORDER — HYDROMORPHONE HCL 1 MG/ML IJ SOLN
1.0000 mg | INTRAMUSCULAR | Status: DC | PRN
  Administered 2015-10-09 – 2015-10-10 (×2): 1 mg via INTRAVENOUS
  Filled 2015-10-09 (×2): qty 1

## 2015-10-09 MED ORDER — CEFAZOLIN SODIUM 1-5 GM-% IV SOLN
1.0000 g | INTRAVENOUS | Status: DC
  Filled 2015-10-09: qty 50

## 2015-10-09 MED ORDER — LIDOCAINE HCL (PF) 1 % IJ SOLN
INTRAMUSCULAR | Status: DC | PRN
  Administered 2015-10-09: 5 mL via INTRADERMAL

## 2015-10-09 MED ORDER — MORPHINE SULFATE (PF) 4 MG/ML IV SOLN: 5.0000 mg | INTRAVENOUS | Status: DC | PRN

## 2015-10-09 MED ORDER — ASPIRIN 81 MG PO CHEW
81.0000 mg | CHEWABLE_TABLET | Freq: Every day | ORAL | Status: DC
  Administered 2015-10-11 – 2015-10-12 (×2): 81 mg via ORAL
  Filled 2015-10-09 (×2): qty 1

## 2015-10-09 MED ORDER — SODIUM CHLORIDE 0.9 % IV SOLN
INTRAVENOUS | Status: DC | PRN
  Administered 2015-10-09: 250 mL via INTRAVENOUS

## 2015-10-09 MED ORDER — SODIUM CHLORIDE 0.9 % IV BOLUS (SEPSIS)
1000.0000 mL | Freq: Once | INTRAVENOUS | Status: AC
  Administered 2015-10-09: 1000 mL via INTRAVENOUS

## 2015-10-09 MED ORDER — MIDAZOLAM HCL 2 MG/2ML IJ SOLN
INTRAMUSCULAR | Status: AC
Start: 2015-10-09 — End: 2015-10-10
  Filled 2015-10-09: qty 2

## 2015-10-09 MED ORDER — HYDROCHLOROTHIAZIDE 12.5 MG PO CAPS
12.5000 mg | ORAL_CAPSULE | Freq: Every day | ORAL | Status: DC
  Administered 2015-10-11 – 2015-10-12 (×2): 12.5 mg via ORAL
  Filled 2015-10-09 (×2): qty 1

## 2015-10-09 MED ORDER — LIDOCAINE HCL (PF) 1 % IJ SOLN
INTRAMUSCULAR | Status: AC
  Filled 2015-10-09: qty 30

## 2015-10-09 MED ORDER — HEPARIN (PORCINE) IN NACL 100-0.45 UNIT/ML-% IJ SOLN
400.0000 [IU]/h | INTRAMUSCULAR | Status: DC
  Administered 2015-10-09: 400 [IU]/h via INTRAVENOUS
  Filled 2015-10-09 (×2): qty 250

## 2015-10-09 MED ORDER — SODIUM CHLORIDE 0.9% FLUSH: 10.0000 mL | INTRAVENOUS | Status: DC | PRN

## 2015-10-09 SURGICAL SUPPLY — 26 items
BALLN DORADO 5X200X135 (BALLOONS) ×4
BALLN LUTONIX 6X150X130 (BALLOONS) ×12
BALLOON DORADO 5X200X135 (BALLOONS) ×3 IMPLANT
BALLOON LUTONIX 6X150X130 (BALLOONS) ×9 IMPLANT
CATH CROSSER 14S OTW 146CM (CATHETERS) ×4 IMPLANT
CATH INFUS 135CMX50CM (CATHETERS) ×4 IMPLANT
CATH PIG 70CM (CATHETERS) ×4 IMPLANT
CATH SIDEKICK XL ST 110CM (SHEATH) ×4 IMPLANT
CATH STS 5FR 125CM (CATHETERS) ×4 IMPLANT
DEVICE PRESTO INFLATION (MISCELLANEOUS) ×4 IMPLANT
DEVICE SOLENT OMNI 120CM (CATHETERS) ×4 IMPLANT
DEVICE TORQUE (MISCELLANEOUS) ×4 IMPLANT
GLIDEWIRE ANGLED SS 035X260CM (WIRE) ×4 IMPLANT
KIT FLOWMATE PROCEDURAL (MISCELLANEOUS) ×4 IMPLANT
PACK ANGIOGRAPHY (CUSTOM PROCEDURE TRAY) ×4 IMPLANT
SET INTRO CAPELLA COAXIAL (SET/KITS/TRAYS/PACK) ×4 IMPLANT
SHEATH ANL2 6FRX45 HC (SHEATH) ×4 IMPLANT
SHEATH BRITE TIP 5FRX11 (SHEATH) ×4 IMPLANT
SHEATH FLEXOR ANSEL2 7FRX45 (SHEATH) ×4 IMPLANT
SUT SILK 0 FSL (SUTURE) ×4 IMPLANT
SYR MEDRAD MARK V 150ML (SYRINGE) ×4 IMPLANT
TUBING CONTRAST HIGH PRESS 72 (TUBING) ×4 IMPLANT
VALVE HEMO TOUHY BORST Y (VALVE) ×4 IMPLANT
WIRE G V18X300CM (WIRE) ×4 IMPLANT
WIRE HI TORQ VERSACORE 300 (WIRE) ×4 IMPLANT
WIRE J 3MM .035X145CM (WIRE) ×4 IMPLANT

## 2015-10-09 NOTE — Progress Notes (Signed)
Dr. Delana Meyer updated on Pt's condition. Pt's SBP now in 90's and continues to have moderate bleeding around catheter. Denies any discomfort. New orders to give 1L NS bolus, change dressing, and to call back if Pt's hemoglobin has dropped 2 or more with next scheduled recheck. Will continue to monitor closely.

## 2015-10-09 NOTE — Op Note (Signed)
OPERATIVE NOTE   PROCEDURE: 1. Insertion of triple-lumen central venous catheter right IJ approach.  PRE-OPERATIVE DIAGNOSIS: Atherosclerotic occlusive disease right lower extremity with rest pain; ischemic right lower extremity  POST-OPERATIVE DIAGNOSIS: Same  SURGEON: Katha Cabal M.D.  ANESTHESIA: 1% lidocaine local infiltration  ESTIMATED BLOOD LOSS: Minimal cc  INDICATIONS:   Joshua [name] is a 60 y.o. male who presents with ischemia right lower extremity. He is undergoing thrombolysis and therefore requires appropriate IV access.  DESCRIPTION: After obtaining full informed written consent, the patient was positioned supine. The right neck was prepped and draped in a sterile fashion. Ultrasound was placed in a sterile sleeve. Ultrasound was utilized to identify the right internal jugular vein which is noted to be echolucent and compressible indicating patency. Images recorded for the permanent record. Under real-time visualization a Seldinger needle is inserted into the vein and the guidewires advanced without difficulty. Small counterincision was made at the wire insertion site. Dilators passed over the wire and the triple-lumen catheter is fed without difficulty.  All 3 lm aspirate and flush easily and are packed with heparin saline. Catheter secured to the skin of the right neck with 2-0 silk. A sterile dressing is applied with Biopatch.  COMPLICATIONS: None  CONDITION: Unchanged  Katha Cabal, M.D. [facility] Office:  [phone]   10/09/2015, 2:23 PM

## 2015-10-09 NOTE — Progress Notes (Signed)
Paged Dr Delana Meyer regarding patient fibrogin level and APTT. At this point no additional orders given.

## 2015-10-09 NOTE — Progress Notes (Signed)
ANTICOAGULATION CONSULT NOTE - Initial Consult  Pharmacy Consult for Heparin Indication: maintain sheath patency  No Known Allergies  Patient Measurements: Height: 6' (182.9 cm) Weight: 213 lb (96.616 kg) IBW/kg (Calculated) : 77.6   Vital Signs: Temp: 98.5 F (36.9 C) (03/21 0722) BP: 122/74 mmHg (03/21 1313) Pulse Rate: 58 (03/21 1313)  Labs:  Recent Labs  10/08/15 1118  CREATININE 1.23    Estimated Creatinine Clearance: 79.9 mL/min (by C-G formula based on Cr of 1.23).   Medical History: Past Medical History  Diagnosis Date  . Hypertension   . Peripheral vascular disease (HCC)     Medications:  Scheduled:  . aspirin  81 mg Oral Daily  . hydrochlorothiazide  12.5 mg Oral Daily  . lisinopril  10 mg Oral Daily  . simvastatin  20 mg Oral Daily  . sodium chloride flush  3 mL Intravenous Q12H   Infusions:  . alteplase (TPA-ACTIVASE) *DIALYSIS CATH* 5 mg infusion 5 mg (10/09/15 1148)  . heparin 400 Units/hr (10/09/15 1229)    Assessment: 60 y/o M s/p vascular procedure with heparin running to keep the sheath open.   Goal of Therapy:  DO NOT TITRATE  Monitor platelets by anticoagulation protocol: Yes   Plan:  Will continue to follow platelets and to make sure that heparin drip is discontinued once alteplase d/c.   Ulice Dash D 10/09/2015,1:46 PM

## 2015-10-09 NOTE — Progress Notes (Signed)
Patient alert and oriented. Reporting calf cramping to right leg, MD aware and versed administered per order. Pulses absent to right foot, leg cool, mottled, MD aware. Report called to Hinton Dyer, will transfer shortly.

## 2015-10-09 NOTE — Progress Notes (Signed)
S: Patient states foot is feeling better although very sore calf is no longer cramping  O: Heart rate 60 blood pressure 120/30 O2 sats 96      Right foot is hyperemic excellent Doppler signal in the posterior tibial distribution however the posterior tibials not palpable. There is some bleeding from around the catheter  A:  Thrombolysis secondary to ischemic right foot  P:  Continue thrombolysis as the patient is clearly markedly improved over this morning and thrombolysis is clearly working. We'll try to heal with the left groin ,dressing will be reinforced and this appears manageable

## 2015-10-09 NOTE — Op Note (Signed)
[facility] Percutaneous Study/Intervention Procedural Note   Date of Surgery: 10/09/2015  Surgeon:  Katha Cabal, MD.  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 Introduction catheter into right lower extremity 3rd order catheter placement  2. Contrast injection right lower extremity for distal runoff   3. Crosser atherectomy of the right SFA and popliteal arteries 4.  Percutaneous transluminal angioplasty right superficial femoral artery and popliteal             5.   Infusion of TPA for thrombolysis             6.   Angiojet mechanical thrombectomy with the Omni device              Anesthesia: Conscious sedation was administered under my direct supervision. IV Versed plus fentanyl were utilized. Continuous ECG, pulse oximetry and blood pressure was monitored throughout the entire procedure. Versed and fentanyl were utilized.  Conscious sedation was for a total of 3 hours.  Sheath: 7 [place] Ansell left common femoral  Contrast: 55 cc  Fluoroscopy Time: 15.7 minutes  Indications: Joshua [name] presents with increasing pain in his right lower extremity associated with lifestyle limiting claudication. The risks and benefits are reviewed all questions answered patient agrees to proceed.  Procedure: Joshua [name] is a 60 y.o. y.o. male who was identified and appropriate procedural time out was performed. The patient was then placed supine on the table and prepped and draped in the usual sterile fashion.   Ultrasound was placed in the sterile sleeve and the left groin was evaluated the left common femoral artery was echolucent and pulsatile indicating patency.  Image was recorded for the permanent record and under real-time visualization a  microneedle was inserted into the common femoral artery microwire followed by a micro-sheath.  A J-wire was then advanced through the micro-sheath and a  5 [place] sheath was then inserted over a J-wire. J-wire was then advanced and a 5 French pigtail catheter was positioned to just above the bifurcation and a LAO view of the pelvis was obtained.  Subsequently a pigtail catheter with the stiff angle Glidewire was used to cross the aortic bifurcation the catheter wire were advanced down into the right distal external iliac artery. Oblique view of the femoral bifurcation was then obtained and subsequently the wire was reintroduced and the pigtail catheter negotiated into the SFA representing third order catheter placement. Distal runoff was then performed.  5000 units of heparin was then given and allowed to circulate and a 6 [place] Ansell sheath was advanced up and over the bifurcation and positioned in the femoral artery  The 14 S Crosser catheter was then prepped on the field and a straight micro- catheter was advanced into the cul-de-sac of the SFA under magnified imaging in the LAO projection. Using the Crosser catheter the occlusion was negotiated and hand injection of contrast was used to verify intraluminal placement distally in the popliteal at the level of the femoral condyles.   A versa core wire was then introduced and initially a 5 x 20 Dor[place] balloon was used to serially angioplasty the popliteal and SFA. 3 inflations were required each inflation was to 16 atm for 2 minutes. Follow-up imaging demonstrated no flow within the SFA no different from prior to the intervention. Therefore a 6 x 15 Lutonix balloon was advanced across the distal aspect of the  lesion subsequent to more 6 x 15 Lutonix balloons were used in a serial fashion all inflations were to 14 atm for 2 minutes. Follow-up imaging did not demonstrate any forward flow in the SFA.   TPA was then reconstituted and a Omni AngioJet was used  to lace the entire length of the occlusion with 8 milligrams of TPA. TPA was then allowed to dwell for 30 minutes. Follow-up imaging demonstrated incomplete thrombolysis with minimal improvement and therefore the AngioJet catheter was used to achieve a more thorough thrombectomy. Multiple passes were made after which follow-up imaging was obtained. At this point image did demonstrate forward flow but there is a large burden of thrombus present within the SFA and popliteal as well as thrombus now near the trifurcation.  Based on this finding I elected to initiate continuous TPA infusion overnight. A 50 cm infusion length catheter was then positioned in the tibioperoneal trunk extending back into the common femoral. Pharmacy was contacted and a TPA infusion of 0.5 mg/h has been initiated. The patient will be rechecked tomorrow morning.    Findings: The distal aorta itself has diffuse disease and no hemodynamically significant lesions are noted in the common and external iliac arteries which are widely patent bilaterally.  The right common femoral is widely patent as is the profunda femoris.  The SFA does indeed have an occlusion beginning several millimeters from its origin which extends down to the mid popliteal at the level of the femoral condyles. The previously placed stents are involved within this thrombosis.  The distal popliteal and tibioperoneal trunk appears patent trifurcation is patent however anterior tibial is somewhat small and seems to be disease throughout its entire course. Tibioperoneal trunk and posterior tibial are widely patent and dominant runoff to the foot. Peroneal is small but patent down to the ankle.  Following angioplasty there is no improvement in the forward flow. Following initial TPA and then AngioJet there is now forward flow but there remains a heavy burden of thrombus noted throughout the SFA and distal popliteal. As noted above patient will be undergoing thrombolysis  overnight.   Disposition: Patient was taken to the ICU in stable condition having tolerated the procedure well.  Joshua [name], Joshua [name] 10/09/2015,2:12 PM

## 2015-10-09 NOTE — Progress Notes (Signed)
Dr Delana Meyer evaluated patient at change of shift. Moderate blood drainage. Orders to place foam surgical tape and reinforce.

## 2015-10-10 ENCOUNTER — Encounter: Payer: Self-pay | Admitting: Vascular Surgery

## 2015-10-10 ENCOUNTER — Encounter
Admission: AD | Disposition: A | Discharge status: Home / Self Care | Payer: Self-pay | Source: Ambulatory Visit | Attending: Vascular Surgery

## 2015-10-10 HISTORY — PX: PERIPHERAL VASCULAR CATHETERIZATION: SHX172C

## 2015-10-10 LAB — CBC
HCT: 35.4 % — ABNORMAL LOW (ref 40.0–52.0)
HCT: 35.4 % — ABNORMAL LOW (ref 40.0–52.0)
Hemoglobin: 11.8 g/dL — ABNORMAL LOW (ref 13.0–18.0)
Hemoglobin: 11.9 g/dL — ABNORMAL LOW (ref 13.0–18.0)
MCH: 28.3 pg (ref 26.0–34.0)
MCH: 28.7 pg (ref 26.0–34.0)
MCHC: 33.2 g/dL (ref 32.0–36.0)
MCHC: 33.6 g/dL (ref 32.0–36.0)
MCV: 85.6 fL (ref 80.0–100.0)
MCV: 85.4 fL (ref 80.0–100.0)
PLATELETS: 136 10*3/uL — AB (ref 150–440)
Platelets: 133 10*3/uL — ABNORMAL LOW (ref 150–440)
RBC: 4.14 MIL/uL — AB (ref 4.40–5.90)
RBC: 4.15 MIL/uL — ABNORMAL LOW (ref 4.40–5.90)
RDW: 12.9 % (ref 11.5–14.5)
RDW: 13.4 % (ref 11.5–14.5)
WBC: 7.3 10*3/uL (ref 3.8–10.6)
WBC: 7.9 10*3/uL (ref 3.8–10.6)

## 2015-10-10 LAB — BASIC METABOLIC PANEL
Anion gap: 10 (ref 5–15)
BUN: 25 mg/dL — AB (ref 6–20)
CALCIUM: 8.3 mg/dL — AB (ref 8.9–10.3)
CO2: 21 mmol/L — ABNORMAL LOW (ref 22–32)
Chloride: 102 mmol/L (ref 101–111)
Creatinine, Ser: 1.18 mg/dL (ref 0.61–1.24)
GFR calc Af Amer: >60 mL/min (ref >60)
GFR calc non Af Amer: >60 mL/min (ref >60)
GLUCOSE: 95 mg/dL (ref 65–99)
Potassium: 4.4 mmol/L (ref 3.5–5.1)
Sodium: 133 mmol/L — ABNORMAL LOW (ref 135–145)

## 2015-10-10 LAB — FIBRINOGEN
Fibrinogen: 169 mg/dL — ABNORMAL LOW (ref 210–470)
Fibrinogen: 134 mg/dL — ABNORMAL LOW (ref 210–470)

## 2015-10-10 LAB — HEPARIN LEVEL (UNFRACTIONATED): Heparin Unfractionated: <0.1 IU/mL — ABNORMAL LOW (ref 0.30–0.70)

## 2015-10-10 SURGERY — LOWER EXTREMITY ANGIOGRAPHY
Anesthesia: Moderate Sedation | Laterality: Right

## 2015-10-10 MED ORDER — APIXABAN 5 MG PO TABS
5.0000 mg | ORAL_TABLET | Freq: Two times a day (BID) | ORAL | Status: DC
  Administered 2015-10-11: 5 mg via ORAL
  Filled 2015-10-10: qty 1

## 2015-10-10 MED ORDER — HEPARIN SODIUM (PORCINE) 1000 UNIT/ML IJ SOLN
INTRAMUSCULAR | Status: AC
  Filled 2015-10-10: qty 1

## 2015-10-10 MED ORDER — TIROFIBAN HCL IN NACL 5-0.9 MG/100ML-% IV SOLN
0.1500 ug/kg/min | INTRAVENOUS | Status: DC
  Administered 2015-10-10 – 2015-10-11 (×4): 0.15 ug/kg/min via INTRAVENOUS
  Filled 2015-10-10 (×4): qty 100

## 2015-10-10 MED ORDER — ACETAMINOPHEN 325 MG PO TABS: 650.0000 mg | ORAL_TABLET | ORAL | Status: DC | PRN

## 2015-10-10 MED ORDER — LIDOCAINE-EPINEPHRINE (PF) 1 %-1:200000 IJ SOLN
INTRAMUSCULAR | Status: AC
  Filled 2015-10-10: qty 30

## 2015-10-10 MED ORDER — DEXTROSE 5 % IV SOLN
INTRAVENOUS | Status: AC
  Filled 2015-10-10 (×28): qty 1.5

## 2015-10-10 MED ORDER — MIDAZOLAM HCL 5 MG/5ML IJ SOLN
INTRAMUSCULAR | Status: AC
  Filled 2015-10-10: qty 5

## 2015-10-10 MED ORDER — HEPARIN SODIUM (PORCINE) 1000 UNIT/ML IJ SOLN
INTRAMUSCULAR | Status: DC | PRN
  Administered 2015-10-10: 3000 [IU] via INTRAVENOUS

## 2015-10-10 MED ORDER — FENTANYL CITRATE (PF) 100 MCG/2ML IJ SOLN
INTRAMUSCULAR | Status: DC | PRN
  Administered 2015-10-10 (×3): 50 ug via INTRAVENOUS

## 2015-10-10 MED ORDER — FENTANYL CITRATE (PF) 100 MCG/2ML IJ SOLN
INTRAMUSCULAR | Status: AC
  Filled 2015-10-10: qty 2

## 2015-10-10 MED ORDER — TIROFIBAN HCL IV 12.5 MG/250 ML: INTRAVENOUS | Status: DC | PRN

## 2015-10-10 MED ORDER — TIROFIBAN HCL IV 12.5 MG/250 ML: 0.1500 ug/kg/min | INTRAVENOUS | Status: DC

## 2015-10-10 MED ORDER — IOHEXOL 300 MG/ML  SOLN
INTRAMUSCULAR | Status: DC | PRN
  Administered 2015-10-10: 55 mL via INTRA_ARTERIAL

## 2015-10-10 MED ORDER — SODIUM CHLORIDE 0.9 % IV SOLN
INTRAVENOUS | Status: DC
  Administered 2015-10-10 – 2015-10-11 (×4): via INTRAVENOUS

## 2015-10-10 MED ORDER — HEPARIN (PORCINE) IN NACL 2-0.9 UNIT/ML-% IJ SOLN
INTRAMUSCULAR | Status: AC
  Filled 2015-10-10: qty 1000

## 2015-10-10 MED ORDER — MIDAZOLAM HCL 2 MG/2ML IJ SOLN
INTRAMUSCULAR | Status: DC | PRN
  Administered 2015-10-10: 2 mg via INTRAVENOUS
  Administered 2015-10-10 (×2): 1 mg via INTRAVENOUS

## 2015-10-10 MED ORDER — TIROFIBAN (AGGRASTAT) BOLUS VIA INFUSION
25.0000 ug/kg | Freq: Once | INTRAVENOUS | Status: AC
  Administered 2015-10-10: 2415 ug via INTRAVENOUS
  Filled 2015-10-10: qty 49

## 2015-10-10 SURGICAL SUPPLY — 7 items
BALLN ULTRVRSE 3X300X130 (BALLOONS) ×2
BALLOON ULTRVRSE 3X300X130 (BALLOONS) ×1 IMPLANT
DEVICE PRESTO INFLATION (MISCELLANEOUS) ×2 IMPLANT
DEVICE SOLENT OMNI 120CM (CATHETERS) ×2 IMPLANT
DEVICE STARCLOSE SE CLOSURE (Vascular Products) ×2 IMPLANT
GLIDEWIRE ADV .035X260CM (WIRE) ×2 IMPLANT
PACK ANGIOGRAPHY (CUSTOM PROCEDURE TRAY) ×2 IMPLANT

## 2015-10-10 NOTE — Progress Notes (Signed)
Report given to Bradford, Therapist, sports.  A&Ox4.  Denies pain. NSR per cardiac monitor. No respiratory distress.  Tolerating diet. Urine has mostly cleared and is yellow/pink tinged at this time. Left groin site is free of complications with PAD intact.  +1 palpable right pedal pulse and +2 palpable left pedal pulse.

## 2015-10-10 NOTE — Progress Notes (Signed)
RN notified Dr. Delana Meyer that patient's urine is burgundy and that the fibrinogen this morning at 0530 was 134. Dr. Delana Meyer acknowledged stating he is aware and gave no new orders.

## 2015-10-10 NOTE — H&P (Signed)
  [facility] History & Physical Update  The patient was interviewed and re-examined.  The patient's previous History and Physical has been reviewed and is unchanged.  There is no change in the plan of care. We plan to proceed with the scheduled procedure.  DEW,JASON, MD  10/10/2015, 7:52 AM

## 2015-10-10 NOTE — Progress Notes (Signed)
Patient sitting up in bed. Left groin site free of complications. +1 palpable pulse to right pedis. +2 palpable pulse to left pedis. Patient ordered lunch and waiting for it to arrive.

## 2015-10-10 NOTE — Op Note (Signed)
[facility] Percutaneous Study/Intervention Procedural Note   Date of Surgery: 10/10/2015  Surgeon(s):Angelamarie Avakian   Assistants:none  Pre-operative Diagnosis: PAD with rest pain right lower extremity, status post catheter placement for overnight thrombolysis  Post-operative diagnosis: Same  Procedure(s) Performed: 1. Angiogram of right lower extremity 2. Catheter placement into right posterior tibial artery from left femoral approach 3. Mechanical rheolytic thrombectomy the AngioJet on the catheter to the right common femoral artery, superficial femoral artery, tibioperoneal trunk, and posterior tibial artery for approximately 150 cc of effluent returned 4. Percutaneous transluminal angioplasty of right posterior tibial artery with 3 mm diameter by 30 cm length angioplasty balloon 5. StarClose closure device left femoral artery  EBL: Minimal  Contrast: 55 cc  Fluro Time: 3.6 minutes  Moderate Conscious Sedation Time: approximately 45 minutes using 4 mg of Versed and 150 mcg of Fentanyl  Indications: Patient is a 60 y.o.male with rest pain of the right foot with thrombosis of previous interventions. He had an angiogram yesterday and had a catheter placement for overnight lysis. He is now back for second look angiography and further attempts at revascularization. The patient is brought in for angiography for further evaluation and potential treatment. Risks and benefits are discussed and informed consent is obtained  Procedure: The patient was identified and appropriate procedural time out was performed. The patient was then placed supine on the table and prepped and draped in the usual sterile fashion.Moderate conscious sedation was administered during a face to face encounter with the patient throughout the procedure with my supervision of the RN administering medicines and  monitoring the patient's vital signs, pulse oximetry, telemetry and mental status throughout from the start of the procedure until the patient was taken to the recovery room. The existing lysis catheter was removed and an advantage wire was placed into the posterior tibial artery. Selective right lower extremity angiogram was then performed. This demonstrated thrombus in the common femoral artery and femoral bifurcation proximally with mild thrombosis within the right SFA stent. Stenosis which appeared near occlusive in the mid segment and thrombus at 2 areas in the right posterior tibial artery which was the dominant runoff distally. The flow was still much better than his initial starting point yesterday. The patient was systemically heparinized.. I then used the AngioJet Omni catheter to treat the thrombus within the common femoral artery, superficial femoral artery, and advanced all the way down into the mid to distal posterior tibial artery. Approximately 150 cc of effluent returned. This resulted in minimal residual thrombus in the proximal SFA and profunda femoris artery with brisk flow but now the areas of stenosis within the posterior tibial artery caused very little flow to be seen in the posterior tibial artery. The peroneal artery did have some flow distally in the anterior tibial artery was occluded but reconstituted in the foot. I then took the wire into the foot through the posterior tibial artery and used 2 inflations to 12 atm for 1 minute with a 3 mm diameter by 30 cm length angioplasty balloon to treat the entire posterior tibial artery into the foot. Following these angioplasties, there was brisk flow into the foot although significant spasm was seen in the posterior tibial artery. At this point, I felt we had performed successful revascularization and we will continue him on anticoagulants to keep our intervention open. I elected to terminate the procedure. The sheath was removed and StarClose  closure device was deployed in the left femoral artery with excellent hemostatic result.  The patient was taken to the recovery room in stable condition having tolerated the procedure well.  Findings:   Right Lower Extremity: Thrombus in the common femoral artery and femoral bifurcation proximally with mild thrombosis within the right SFA stent. Stenosis which appeared near occlusive in the mid segment and thrombus at 2 areas in the right posterior tibial artery which was the dominant runoff distally.   Disposition: Patient was taken to the recovery room in stable condition having tolerated the procedure well.  Complications: None  Joshua [name] 10/10/2015 9:01 AM

## 2015-10-10 NOTE — Progress Notes (Signed)
RN paged Dr. Lucky Cowboy and spoke with OR nurse Lattie Haw and Dr. Lucky Cowboy over phone and made MD aware that patient came from special procedures without aggrostat drip infusing and that order had not been released until this RN released it in ICU.  RN asked should it be running?  Dr. Lucky Cowboy stated "yes it should have already been running and did the patient get the bolus?"  This RN not aware if patient got bolus during procedure and will call Jordanna, RN in specials and ask and then call Dr. Lucky Cowboy back.

## 2015-10-10 NOTE — Progress Notes (Addendum)
This RN spoke with OR nurse Lattie Haw and Dr. Lucky Cowboy over phone and made MD aware that this RN spoke with Gwenlyn Found, RN in special procedures and that Aggrostat bolus was not given intra procedure in lab therefore patient has not had any aggrostat because drip was not released nor started prior to coming to ICU from specials.  Dr. Lucky Cowboy gave order to start aggrostat drip and give aggrostat bolus now and to start NS @ 75 cc/H.

## 2015-10-11 ENCOUNTER — Encounter: Payer: Self-pay | Admitting: Vascular Surgery

## 2015-10-11 LAB — CBC
HCT: 27.7 % — ABNORMAL LOW (ref 40.0–52.0)
HEMOGLOBIN: 9.4 g/dL — AB (ref 13.0–18.0)
MCH: 28.8 pg (ref 26.0–34.0)
MCHC: 34 g/dL (ref 32.0–36.0)
MCV: 84.6 fL (ref 80.0–100.0)
Platelets: 113 10*3/uL — ABNORMAL LOW (ref 150–440)
RBC: 3.27 MIL/uL — AB (ref 4.40–5.90)
RDW: 12.8 % (ref 11.5–14.5)
WBC: 6.6 10*3/uL (ref 3.8–10.6)

## 2015-10-11 LAB — BASIC METABOLIC PANEL
Anion gap: 5 (ref 5–15)
BUN: 20 mg/dL (ref 6–20)
CHLORIDE: 99 mmol/L — AB (ref 101–111)
CO2: 26 mmol/L (ref 22–32)
Calcium: 8.1 mg/dL — ABNORMAL LOW (ref 8.9–10.3)
Creatinine, Ser: 0.87 mg/dL (ref 0.61–1.24)
GFR calc Af Amer: >60 mL/min (ref >60)
Glucose, Bld: 167 mg/dL — ABNORMAL HIGH (ref 65–99)
POTASSIUM: 3.9 mmol/L (ref 3.5–5.1)
SODIUM: 130 mmol/L — AB (ref 135–145)

## 2015-10-11 MED ORDER — WARFARIN SODIUM 1 MG PO TABS: 7.5000 mg | ORAL_TABLET | Freq: Once | ORAL | Status: DC

## 2015-10-11 MED ORDER — WARFARIN - PHYSICIAN DOSING INPATIENT: Freq: Every day | Status: DC

## 2015-10-11 MED ORDER — HEPARIN (PORCINE) IN NACL 100-0.45 UNIT/ML-% IJ SOLN
1350.0000 [IU]/h | INTRAMUSCULAR | Status: DC
  Filled 2015-10-11: qty 250

## 2015-10-11 MED ORDER — WARFARIN SODIUM 1 MG PO TABS: 5.0000 mg | ORAL_TABLET | Freq: Every day | ORAL | Status: DC

## 2015-10-11 MED ORDER — WARFARIN SODIUM 1 MG PO TABS: 7.5000 mg | ORAL_TABLET | Freq: Every day | ORAL | Status: DC

## 2015-10-11 MED ORDER — APIXABAN 5 MG PO TABS
5.0000 mg | ORAL_TABLET | Freq: Two times a day (BID) | ORAL | Status: DC
  Administered 2015-10-11 – 2015-10-12 (×2): 5 mg via ORAL
  Filled 2015-10-11 (×2): qty 1

## 2015-10-11 NOTE — Progress Notes (Signed)
ANTICOAGULATION CONSULT NOTE - Initial Consult  Pharmacy Consult for Heparin Drip  No Known Allergies  Patient Measurements: Height: 6' (182.9 cm) Weight: 212 lb 15.4 oz (96.6 kg) IBW/kg (Calculated) : 77.6  Vital Signs: Temp: 98.6 F (37 C) (03/23 0745) Temp Source: Oral (03/23 0745) BP: 112/59 mmHg (03/23 0700) Pulse Rate: 70 (03/23 0700)  Labs:  Recent Labs  10/09/15 1442 10/09/15 1837 10/10/15 0033 10/10/15 0531 10/11/15 0903  HGB 13.2 13.0 11.8* 11.9* 9.4*  HCT 39.8* 39.2* 35.4* 35.4* 27.7*  PLT 140* 144* 133* 136* 113*  APTT 40*  --   --   --   --   LABPROT 14.9  --   --   --   --   INR 1.15  --   --   --   --   HEPARINUNFRC <0.10* <0.10* <0.10* <0.10*  --   CREATININE  --   --   --  1.18 0.87    Estimated Creatinine Clearance: 112.9 mL/min (by C-G formula based on Cr of 0.87).   Medical History: Past Medical History  Diagnosis Date  . Hypertension   . Peripheral vascular disease (HCC)     Medications:  Scheduled:  . aspirin  81 mg Oral Daily  . hydrochlorothiazide  12.5 mg Oral Daily  . lisinopril  10 mg Oral Daily  . simvastatin  20 mg Oral QPC supper  . sodium chloride flush  10-40 mL Intracatheter Q12H  . [START ON 10/13/2015] warfarin  5 mg Oral q1800  . warfarin  7.5 mg Oral ONCE-1800  . [START ON 10/12/2015] warfarin  7.5 mg Oral q1800  . Warfarin - Physician Dosing Inpatient   Does not apply q1800   Infusions:  . sodium chloride 75 mL/hr at 10/11/15 0908  . heparin      Assessment: Pharmacy consulted for heparin drip management s/p angioplasty. Patient initially started on Eliquis 3/23 and received a single am dose. Patient is also being initiated on warfarin, managed by MD.   Goal of Therapy:  Heparin level 0.3-0.7 units/ml Monitor platelets by anticoagulation protocol: Yes  aPTT 66-102s   Plan:  Will initiate patient on heparin drip at 1350 units/hr starting at 2100. Will obtain aPTT & anti-Xa at 0300 on 3/24.    Patient to  start warfarin at 1800. Will obtain INR with am labs.   Recommend bridging for minimum of 5 days and 2 consecutive INRs in range.   Pharmacy will continue to monitor and adjust per consult.    Simpson,Michael L 10/11/2015,12:32 PM

## 2015-10-11 NOTE — Care Management (Signed)
Patient was placed on Eliquis November 2016 and did not have any copay.  Says when his insurance changed in January 2017- my copay was too high and "I ain't paying it."   Pharmacy has recommeding '5mg'$  bid.  Does not recall ever using coupons for eliquis- either 30 day free trial or continued copay assist.  Called prescription to [facility] in [place].  Activated an Eliquis coupon and patient's copay will be 10 dollars a month.  Provided patient with the coupon information.

## 2015-10-11 NOTE — Progress Notes (Signed)
Telephone report called to Ovid, Rn on 1C.

## 2015-10-11 NOTE — Progress Notes (Signed)
[facility]  Daily Progress Note   Subjective  - 1,2 Day Post-Op  Patient states he feels much better, "my toes don't hurt anymore.  Last time my toes still hurt after the procedure"  Objective Filed Vitals:   10/11/15 0800 10/11/15 0900 10/11/15 1240 10/11/15 1617  BP: 118/63 109/58 117/70 127/52  Pulse: 74 78  78  Temp:   98.2 F (36.8 C) 98.7 F (37.1 C)  TempSrc:   Oral Oral  Resp: '11 16 14 16  '$ Height:      Weight:      SpO2: 100% 99% 100% 100%    Intake/Output Summary (Last 24 hours) at 10/11/15 1759 Last data filed at 10/11/15 1752  Gross per 24 hour  Intake 2318.44 ml  Output   2675 ml  Net -356.56 ml    PULM  Normal effort , no use of accessory muscles CV  No JVD, RRR Abd      No distended, nontender VASC  2+ right PT pulse left groin small hematoma right leg with 2+ edema and mild erythema consistent with hyperemic reperfusion  Laboratory CBC    Component Value Date/Time   WBC 6.6 10/11/2015 0903   HGB 9.4* 10/11/2015 0903   HCT 27.7* 10/11/2015 0903   PLT 113* 10/11/2015 0903    BMET    Component Value Date/Time   NA 130* 10/11/2015 0903   K 3.9 10/11/2015 0903   CL 99* 10/11/2015 0903   CO2 26 10/11/2015 0903   GLUCOSE 167* 10/11/2015 0903   BUN 20 10/11/2015 0903   BUN 16 07/03/2014 1205   CREATININE 0.87 10/11/2015 0903   CREATININE 1.34* 07/03/2014 1205   CALCIUM 8.1* 10/11/2015 0903   GFRNONAA >60 10/11/2015 0903   GFRNONAA 59* 07/03/2014 1205   GFRAA >60 10/11/2015 0903   GFRAA >60 07/03/2014 1205    Assessment/Planning: POD #1,2 s/p recanalization of the right SFA with thrombolysis  OK to go to floor  Up as tolerated no restrictions  I was planning on Coumadin long term but we have bee able to get Eliquis for 2 years a $10 a month which he can afford  Given the Eliquis therapy is doable plan for DC tomorrow    Katha Cabal  10/11/2015, 5:59 PM

## 2015-10-12 MED ORDER — HYDROCODONE-ACETAMINOPHEN 5-325 MG PO TABS: 1.0000 | ORAL_TABLET | Freq: Four times a day (QID) | ORAL | Status: DC | PRN

## 2015-10-12 MED ORDER — APIXABAN 5 MG PO TABS: 5.0000 mg | ORAL_TABLET | Freq: Two times a day (BID) | ORAL | Status: DC

## 2015-10-12 NOTE — Discharge Instructions (Signed)
Information on my medicine - ELIQUIS (apixaban)  This medication education was reviewed with me or my healthcare representative as part of my discharge preparation.  The pharmacist that spoke with me during my hospital stay was:  [facility]  Why was Eliquis prescribed for you? Eliquis was prescribed for you to reduce the risk of forming blood clots that can cause a stroke if you have a medical condition called atrial fibrillation (a type of irregular heartbeat) OR to reduce the risk of a blood clots forming after orthopedic surgery.  What do You need to know about Eliquis ? Take your Eliquis TWICE DAILY - one tablet in the morning and one tablet in the evening with or without food.  It would be best to take the doses about the same time each day.  If you have difficulty swallowing the tablet whole please discuss with your pharmacist how to take the medication safely.  Take Eliquis exactly as prescribed by your doctor and DO NOT stop taking Eliquis without talking to the doctor who prescribed the medication.  Stopping may increase your risk of developing a new clot or stroke.  Refill your prescription before you run out.  After discharge, you should have regular check-up appointments with your healthcare provider that is prescribing your Eliquis.  In the future your dose may need to be changed if your kidney function or weight changes by a significant amount or as you get older.  What do you do if you miss a dose? If you miss a dose, take it as soon as you remember on the same day and resume taking twice daily.  Do not take more than one dose of ELIQUIS at the same time.  Important Safety Information A possible side effect of Eliquis is bleeding. You should call your healthcare provider right away if you experience any of the following: ? Bleeding from an injury or your nose that does not stop. ? Unusual colored urine (red or dark brown) or unusual colored stools (red or  black). ? Unusual bruising for unknown reasons. ? A serious fall or if you hit your head (even if there is no bleeding).  Some medicines may interact with Eliquis and might increase your risk of bleeding or clotting while on Eliquis. To help avoid this, consult your healthcare provider or pharmacist prior to using any new prescription or non-prescription medications, including herbals, vitamins, non-steroidal anti-inflammatory drugs (NSAIDs) and supplements.  This website has more information on Eliquis (apixaban): [email or web address].

## 2015-10-12 NOTE — Plan of Care (Signed)
Problem: Safety: Goal: Ability to remain free from injury will improve Outcome: Progressing Pt free from falls, bed alarm set.     Problem: Health Behavior/Discharge Planning: Goal: Ability to manage health-related needs will improve Outcome: Progressing 1st dose of Eliquis given per MD order.   Problem: Pain Managment: Goal: General experience of comfort will improve Outcome: Progressing Pt denies pain.  Problem: Physical Regulation: Goal: Ability to maintain clinical measurements within normal limits will improve Outcome: Progressing Up with assistance.  Goal: Will remain free from infection Outcome: Progressing Pt afebrile.  Problem: Skin Integrity: Goal: Risk for impaired skin integrity will decrease Outcome: Progressing Safe guard still in place, balloon deflated.   Problem: Tissue Perfusion: Goal: Risk factors for ineffective tissue perfusion will decrease Outcome: Progressing See assessment for pulses.  Problem: Fluid Volume: Goal: Ability to maintain a balanced intake and output will improve Outcome: Progressing Pt enc to increase PO intake, IVF per MD order.   Problem: Bowel/Gastric: Goal: Will not experience complications related to bowel motility Outcome: Progressing Pt stated + BM in ICU this morning.

## 2015-10-12 NOTE — Progress Notes (Signed)
MD making rounds. Discharge orders received. IV discontinued. Prescriptions given to patient. Provided with Education Handouts. Discharge paperwork provided, explained, signed and witnessed. No unanswered questions. Discharged via wheelchair by [employer].

## 2015-10-16 NOTE — Discharge Summary (Signed)
[facility]    Discharge Summary    Patient ID:  Joshua [name] MRN: [id number] DOB/AGE: June 09, 1956 60 y.o.  Admit date: 10/09/2015 Discharge date: 10/12/2015 Date of Surgery: 10/09/2015 - 10/10/2015 Surgeon: Katha Cabal, M.D.; Algernon Huxley, M.D.  Admission Diagnosis: Rt Lower Extr ASO with Claudication  Possible Intervention Claudication and rest pain  Discharge Diagnoses:  Rt Lower Extr ASO with Claudication  Possible Intervention Claudication and rest pain  Secondary Diagnoses: Past Medical History  Diagnosis Date  . Hypertension   . Peripheral vascular disease ([place])     Procedure(s): Lower Extremity Angiography Lower Extremity Intervention  Discharged Condition: good  HPI:  Patient was admitted after angiography showed thrombus within the distal arterial system. He was subsequently initiated on thrombolysis overnight. He was returned to special procedures the next day by Dr. Lucky Cowboy where final intervention was performed. Postoperatively he did well without complication was noted to have a palpable posterior tibial pulse. And on postoperative day 3 he was felt fit for discharge.  Hospital Course:  Joshua [name] is a 60 y.o. male is S/P Right Procedure(s): Lower Extremity Angiography Lower Extremity Intervention Extubated: POD # 0 Physical exam: Palpable posterior tibial pulse right foot Post-op wounds clean, dry, intact or healing well Pt. Ambulating, voiding and taking PO diet without difficulty. Pt pain controlled with PO pain meds. Labs as below Complications:none  Consults:     Significant Diagnostic Studies: CBC Lab Results  Component Value Date   WBC 6.6 10/11/2015   HGB 9.4* 10/11/2015   HCT 27.7* 10/11/2015   MCV 84.6 10/11/2015   PLT 113* 10/11/2015    BMET    Component Value Date/Time   NA 130* 10/11/2015 0903   K 3.9 10/11/2015 0903   CL 99* 10/11/2015 0903   CO2 26 10/11/2015 0903   GLUCOSE 167*  10/11/2015 0903   BUN 20 10/11/2015 0903   BUN 16 07/03/2014 1205   CREATININE 0.87 10/11/2015 0903   CREATININE 1.34* 07/03/2014 1205   CALCIUM 8.1* 10/11/2015 0903   GFRNONAA >60 10/11/2015 0903   GFRNONAA 59* 07/03/2014 1205   GFRAA >60 10/11/2015 0903   GFRAA >60 07/03/2014 1205   COAG Lab Results  Component Value Date   INR 1.15 10/09/2015   INR 0.92 08/02/2009     Disposition:  Discharge to :Home Discharge Instructions    Call MD for:  redness, tenderness, or signs of infection (pain, swelling, bleeding, redness, odor or green/yellow discharge around incision site)    Complete by:  As directed      Call MD for:  severe or increased pain, loss or decreased feeling  in affected limb(s)    Complete by:  As directed      Call MD for:  temperature >100.5    Complete by:  As directed      Discharge instructions    Complete by:  As directed   Ok to shower     Driving Restrictions    Complete by:  As directed   No driving for 2 days     Lifting restrictions    Complete by:  As directed   No lifting for 1 week     Resume previous diet    Complete by:  As directed             Medication List    STOP taking these medications        clopidogrel 75 MG tablet  Commonly known as:  PLAVIX      TAKE these medications        apixaban 5 MG Tabs tablet  Commonly known as:  ELIQUIS  Take 1 tablet (5 mg total) by mouth 2 (two) times daily.     aspirin 81 MG tablet  Take 81 mg by mouth daily.     hydrochlorothiazide 12.5 MG capsule  Commonly known as:  MICROZIDE  Take 12.5 mg by mouth daily.     HYDROcodone-acetaminophen 5-325 MG tablet  Commonly known as:  NORCO  Take 1 tablet by mouth every 6 (six) hours as needed for moderate pain.     lisinopril 10 MG tablet  Commonly known as:  PRINIVIL,ZESTRIL  Take 10 mg by mouth daily.     simvastatin 20 MG tablet  Commonly known as:  ZOCOR  Take 20 mg by mouth daily.       Verbal and written Discharge instructions  given to the patient. Wound care per Discharge AVS     Follow-up Information    Follow up with Careli Luzader, Dolores Lory, MD On 10/22/2015.   Specialties:  Vascular Surgery, Cardiology, Radiology, Vascular Surgery   Why:  at 9:30 a.m.  with Riven Mabile with [facility]   Contact information:   [place] [address] [phone]       Signed: Katha Cabal, MD  10/16/2015, 1:19 PM

## 2015-11-21 ENCOUNTER — Other Ambulatory Visit: Payer: Self-pay | Admitting: Vascular Surgery

## 2015-11-29 ENCOUNTER — Other Ambulatory Visit
Admission: RE | Admit: 2015-11-29 | Discharge: 2015-11-29 | Disposition: A | Discharge status: Home / Self Care | Payer: BLUE CROSS/BLUE SHIELD | Source: Ambulatory Visit | Attending: Vascular Surgery | Admitting: Vascular Surgery

## 2015-11-29 DIAGNOSIS — I739 Peripheral vascular disease, unspecified: Secondary | ICD-10-CM | POA: Insufficient documentation

## 2015-11-29 LAB — CREATININE, SERUM
Creatinine, Ser: 1.16 mg/dL (ref 0.61–1.24)
GFR calc non Af Amer: >60 mL/min (ref >60)

## 2015-11-29 LAB — BUN: BUN: 17 mg/dL (ref 6–20)

## 2015-12-03 ENCOUNTER — Encounter
Admission: RE | Disposition: A | Discharge status: Home / Self Care | Payer: Self-pay | Source: Ambulatory Visit | Attending: Vascular Surgery

## 2015-12-03 ENCOUNTER — Inpatient Hospital Stay
Admission: RE | Admit: 2015-12-03 | Discharge: 2015-12-05 | DRG: 272 | Disposition: A | Discharge status: Home / Self Care | Payer: BLUE CROSS/BLUE SHIELD | Source: Ambulatory Visit | Attending: Vascular Surgery | Admitting: Vascular Surgery

## 2015-12-03 DIAGNOSIS — Z833 Family history of diabetes mellitus: Secondary | ICD-10-CM

## 2015-12-03 DIAGNOSIS — T82868A Thrombosis of vascular prosthetic devices, implants and grafts, initial encounter: Principal | ICD-10-CM | POA: Diagnosis present

## 2015-12-03 DIAGNOSIS — Z7982 Long term (current) use of aspirin: Secondary | ICD-10-CM

## 2015-12-03 DIAGNOSIS — I10 Essential (primary) hypertension: Secondary | ICD-10-CM | POA: Diagnosis present

## 2015-12-03 DIAGNOSIS — I998 Other disorder of circulatory system: Secondary | ICD-10-CM | POA: Diagnosis present

## 2015-12-03 DIAGNOSIS — Z8249 Family history of ischemic heart disease and other diseases of the circulatory system: Secondary | ICD-10-CM

## 2015-12-03 DIAGNOSIS — Z79899 Other long term (current) drug therapy: Secondary | ICD-10-CM | POA: Diagnosis not present

## 2015-12-03 DIAGNOSIS — Y838 Other surgical procedures as the cause of abnormal reaction of the patient, or of later complication, without mention of misadventure at the time of the procedure: Secondary | ICD-10-CM | POA: Diagnosis present

## 2015-12-03 DIAGNOSIS — E785 Hyperlipidemia, unspecified: Secondary | ICD-10-CM | POA: Diagnosis present

## 2015-12-03 DIAGNOSIS — I70211 Atherosclerosis of native arteries of extremities with intermittent claudication, right leg: Secondary | ICD-10-CM | POA: Diagnosis present

## 2015-12-03 DIAGNOSIS — Z7901 Long term (current) use of anticoagulants: Secondary | ICD-10-CM | POA: Diagnosis not present

## 2015-12-03 DIAGNOSIS — R58 Hemorrhage, not elsewhere classified: Secondary | ICD-10-CM

## 2015-12-03 HISTORY — PX: PERIPHERAL VASCULAR CATHETERIZATION: SHX172C

## 2015-12-03 LAB — CBC
HCT: 35.2 % — ABNORMAL LOW (ref 40.0–52.0)
HCT: 31.4 % — ABNORMAL LOW (ref 40.0–52.0)
Hemoglobin: 11.6 g/dL — ABNORMAL LOW (ref 13.0–18.0)
Hemoglobin: 10.5 g/dL — ABNORMAL LOW (ref 13.0–18.0)
MCH: 28.5 pg (ref 26.0–34.0)
MCH: 28.4 pg (ref 26.0–34.0)
MCHC: 33.1 g/dL (ref 32.0–36.0)
MCHC: 33.3 g/dL (ref 32.0–36.0)
MCV: 86 fL (ref 80.0–100.0)
MCV: 85.4 fL (ref 80.0–100.0)
Platelets: 144 10*3/uL — ABNORMAL LOW (ref 150–440)
Platelets: 130 10*3/uL — ABNORMAL LOW (ref 150–440)
RBC: 4.09 MIL/uL — ABNORMAL LOW (ref 4.40–5.90)
RBC: 3.68 MIL/uL — ABNORMAL LOW (ref 4.40–5.90)
RDW: 13.2 % (ref 11.5–14.5)
RDW: 13.1 % (ref 11.5–14.5)
WBC: 7.3 10*3/uL (ref 3.8–10.6)
WBC: 5.3 10*3/uL (ref 3.8–10.6)

## 2015-12-03 LAB — GLUCOSE, CAPILLARY
Glucose-Capillary: 89 mg/dL (ref 65–99)
Glucose-Capillary: 111 mg/dL — ABNORMAL HIGH (ref 65–99)

## 2015-12-03 LAB — FIBRINOGEN
FIBRINOGEN: 332 mg/dL (ref 210–470)
Fibrinogen: 194 mg/dL — ABNORMAL LOW (ref 210–470)

## 2015-12-03 LAB — MRSA PCR SCREENING: MRSA by PCR: NEGATIVE

## 2015-12-03 SURGERY — LOWER EXTREMITY ANGIOGRAPHY
Anesthesia: Moderate Sedation | Site: Leg Lower | Laterality: Right

## 2015-12-03 MED ORDER — DEXTROSE 5 % IV SOLN
1.5000 g | INTRAVENOUS | Status: AC
  Administered 2015-12-03: 1.5 g via INTRAVENOUS

## 2015-12-03 MED ORDER — HYDROCODONE-ACETAMINOPHEN 5-325 MG PO TABS
1.0000 | ORAL_TABLET | ORAL | Status: DC | PRN
  Administered 2015-12-03 – 2015-12-04 (×2): 2 via ORAL
  Filled 2015-12-03 (×2): qty 2

## 2015-12-03 MED ORDER — HEPARIN (PORCINE) IN NACL 100-0.45 UNIT/ML-% IJ SOLN
INTRAMUSCULAR | Status: AC
  Filled 2015-12-03: qty 250

## 2015-12-03 MED ORDER — ALTEPLASE 2 MG IJ SOLR
INTRAMUSCULAR | Status: AC
  Filled 2015-12-03: qty 8

## 2015-12-03 MED ORDER — MAGNESIUM HYDROXIDE 400 MG/5ML PO SUSP: 30.0000 mL | Freq: Every day | ORAL | Status: DC | PRN

## 2015-12-03 MED ORDER — METHYLPREDNISOLONE SODIUM SUCC 125 MG IJ SOLR
125.0000 mg | INTRAMUSCULAR | Status: DC | PRN
Start: 2015-12-03 — End: 2015-12-03

## 2015-12-03 MED ORDER — DEXTROSE-NACL 5-0.9 % IV SOLN
INTRAVENOUS | Status: DC
  Administered 2015-12-03 – 2015-12-04 (×3): via INTRAVENOUS

## 2015-12-03 MED ORDER — ONDANSETRON HCL 4 MG/2ML IJ SOLN: 4.0000 mg | Freq: Four times a day (QID) | INTRAMUSCULAR | Status: DC | PRN

## 2015-12-03 MED ORDER — HEPARIN (PORCINE) IN NACL 100-0.45 UNIT/ML-% IJ SOLN
600.0000 [IU]/h | INTRAMUSCULAR | Status: DC
  Filled 2015-12-03: qty 250

## 2015-12-03 MED ORDER — ACETAMINOPHEN 650 MG RE SUPP: 650.0000 mg | Freq: Four times a day (QID) | RECTAL | Status: DC | PRN

## 2015-12-03 MED ORDER — HEPARIN (PORCINE) IN NACL 2-0.9 UNIT/ML-% IJ SOLN
INTRAMUSCULAR | Status: AC
  Filled 2015-12-03: qty 1000

## 2015-12-03 MED ORDER — ALTEPLASE 30 MG/30 ML FOR INTERV. RAD: 1.0000 mg | INTRA_ARTERIAL | Status: DC | PRN

## 2015-12-03 MED ORDER — SODIUM CHLORIDE 0.9 % IV SOLN: 1.0000 mg/h | INTRAVENOUS | Status: DC

## 2015-12-03 MED ORDER — SODIUM CHLORIDE 0.9% FLUSH
3.0000 mL | Freq: Two times a day (BID) | INTRAVENOUS | Status: DC
  Administered 2015-12-04: 3 mL via INTRAVENOUS

## 2015-12-03 MED ORDER — LISINOPRIL 10 MG PO TABS
10.0000 mg | ORAL_TABLET | Freq: Every day | ORAL | Status: DC
  Administered 2015-12-04 – 2015-12-05 (×2): 10 mg via ORAL
  Filled 2015-12-03 (×2): qty 1

## 2015-12-03 MED ORDER — SODIUM CHLORIDE FLUSH 0.9 % IV SOLN
INTRAVENOUS | Status: AC
  Filled 2015-12-03: qty 100

## 2015-12-03 MED ORDER — SODIUM CHLORIDE 0.9 % IV SOLN
1.0000 mg/h | INTRAVENOUS | Status: DC
Start: 2015-12-03 — End: 2015-12-05
  Administered 2015-12-03: 1 mg/h
  Filled 2015-12-03: qty 10

## 2015-12-03 MED ORDER — MORPHINE SULFATE (PF) 4 MG/ML IV SOLN: 2.0000 mg | INTRAVENOUS | Status: DC | PRN

## 2015-12-03 MED ORDER — FAMOTIDINE 20 MG PO TABS: 40.0000 mg | ORAL_TABLET | ORAL | Status: DC | PRN

## 2015-12-03 MED ORDER — ACETAMINOPHEN 325 MG PO TABS: 650.0000 mg | ORAL_TABLET | Freq: Four times a day (QID) | ORAL | Status: DC | PRN

## 2015-12-03 MED ORDER — SODIUM CHLORIDE 0.9 % IV SOLN: 0.5000 mg/h | INTRAVENOUS | Status: DC

## 2015-12-03 MED ORDER — FENTANYL CITRATE (PF) 100 MCG/2ML IJ SOLN
INTRAMUSCULAR | Status: DC | PRN
  Administered 2015-12-03 (×2): 50 ug via INTRAVENOUS

## 2015-12-03 MED ORDER — SODIUM CHLORIDE 0.9 % IV SOLN
0.5000 mg/h | INTRAVENOUS | Status: DC
Start: 2015-12-03 — End: 2015-12-05
  Administered 2015-12-04: 0.5 mg/h
  Filled 2015-12-03 (×2): qty 10

## 2015-12-03 MED ORDER — FENTANYL CITRATE (PF) 100 MCG/2ML IJ SOLN
INTRAMUSCULAR | Status: AC
  Filled 2015-12-03: qty 2

## 2015-12-03 MED ORDER — CHLORHEXIDINE GLUCONATE CLOTH 2 % EX PADS
6.0000 | MEDICATED_PAD | Freq: Once | CUTANEOUS | Status: DC
  Administered 2015-12-03: 6 via TOPICAL

## 2015-12-03 MED ORDER — HEPARIN SODIUM (PORCINE) 1000 UNIT/ML IJ SOLN
INTRAMUSCULAR | Status: DC | PRN
  Administered 2015-12-03: 5000 [IU] via INTRAVENOUS

## 2015-12-03 MED ORDER — SODIUM CHLORIDE 0.9 % IV SOLN
INTRAVENOUS | Status: DC
Start: 2015-12-03 — End: 2015-12-03

## 2015-12-03 MED ORDER — ALTEPLASE 2 MG IJ SOLR
INTRAMUSCULAR | Status: DC | PRN
  Administered 2015-12-03: 8 mg

## 2015-12-03 MED ORDER — SODIUM CHLORIDE 0.9 % IV SOLN
INTRAVENOUS | Status: DC
Start: 2015-12-03 — End: 2015-12-03
  Administered 2015-12-03: 12:00:00 via INTRAVENOUS

## 2015-12-03 MED ORDER — ONDANSETRON HCL 4 MG/2ML IJ SOLN
4.0000 mg | Freq: Four times a day (QID) | INTRAMUSCULAR | Status: DC | PRN
  Administered 2015-12-04: 4 mg via INTRAVENOUS
  Filled 2015-12-03: qty 2

## 2015-12-03 MED ORDER — FLEET ENEMA 7-19 GM/118ML RE ENEM: 1.0000 | ENEMA | Freq: Once | RECTAL | Status: DC | PRN

## 2015-12-03 MED ORDER — MIDAZOLAM HCL 2 MG/2ML IJ SOLN
INTRAMUSCULAR | Status: DC | PRN
  Administered 2015-12-03 (×2): 2 mg via INTRAVENOUS

## 2015-12-03 MED ORDER — SIMVASTATIN 20 MG PO TABS
20.0000 mg | ORAL_TABLET | Freq: Every day | ORAL | Status: DC
  Administered 2015-12-04 – 2015-12-05 (×2): 20 mg via ORAL
  Filled 2015-12-03 (×2): qty 1

## 2015-12-03 MED ORDER — HYDROCHLOROTHIAZIDE 12.5 MG PO CAPS
12.5000 mg | ORAL_CAPSULE | Freq: Every day | ORAL | Status: DC
  Administered 2015-12-04 – 2015-12-05 (×2): 12.5 mg via ORAL
  Filled 2015-12-03 (×2): qty 1

## 2015-12-03 MED ORDER — LIDOCAINE-EPINEPHRINE (PF) 1 %-1:200000 IJ SOLN
INTRAMUSCULAR | Status: DC | PRN
  Administered 2015-12-03: 10 mL via INTRADERMAL

## 2015-12-03 MED ORDER — ONDANSETRON HCL 4 MG PO TABS: 4.0000 mg | ORAL_TABLET | Freq: Four times a day (QID) | ORAL | Status: DC | PRN

## 2015-12-03 MED ORDER — MIDAZOLAM HCL 5 MG/5ML IJ SOLN
INTRAMUSCULAR | Status: AC
  Filled 2015-12-03: qty 5

## 2015-12-03 MED ORDER — ASPIRIN EC 81 MG PO TBEC
81.0000 mg | DELAYED_RELEASE_TABLET | Freq: Every day | ORAL | Status: DC
Start: 2015-12-03 — End: 2015-12-05
  Administered 2015-12-04 – 2015-12-05 (×2): 81 mg via ORAL
  Filled 2015-12-03 (×2): qty 1

## 2015-12-03 MED ORDER — SORBITOL 70 % SOLN
30.0000 mL | Freq: Every day | Status: DC | PRN
  Filled 2015-12-03: qty 30

## 2015-12-03 MED ORDER — DOCUSATE SODIUM 100 MG PO CAPS
100.0000 mg | ORAL_CAPSULE | Freq: Two times a day (BID) | ORAL | Status: DC
  Administered 2015-12-03 – 2015-12-05 (×4): 100 mg via ORAL
  Filled 2015-12-03 (×4): qty 1

## 2015-12-03 MED ORDER — LIDOCAINE-EPINEPHRINE (PF) 1 %-1:200000 IJ SOLN
INTRAMUSCULAR | Status: AC
  Filled 2015-12-03: qty 30

## 2015-12-03 MED ORDER — HYDROCODONE-ACETAMINOPHEN 5-325 MG PO TABS: 1.0000 | ORAL_TABLET | Freq: Four times a day (QID) | ORAL | Status: DC | PRN

## 2015-12-03 MED ORDER — HYDROMORPHONE HCL 1 MG/ML IJ SOLN: 1.0000 mg | Freq: Once | INTRAMUSCULAR | Status: DC

## 2015-12-03 SURGICAL SUPPLY — 12 items
CATH INFUS 135CMX50CM (CATHETERS) ×2 IMPLANT
CATH PIG 70CM (CATHETERS) ×2 IMPLANT
DEVICE PRESTO INFLATION (MISCELLANEOUS) ×2 IMPLANT
DEVICE SOLENT OMNI 120CM (CATHETERS) ×2 IMPLANT
GLIDEWIRE ADV .035X260CM (WIRE) ×2 IMPLANT
KIT CATH CVC 3 LUMEN 7FR 8IN (MISCELLANEOUS) ×2 IMPLANT
PACK ANGIOGRAPHY (CUSTOM PROCEDURE TRAY) ×2 IMPLANT
SHEATH BRITE TIP 5FRX11 (SHEATH) ×2 IMPLANT
SHEATH HIGHFLEX ANSEL 6FRX55 (SHEATH) ×2 IMPLANT
SYR MEDRAD MARK V 150ML (SYRINGE) ×2 IMPLANT
TUBING CONTRAST HIGH PRESS 72 (TUBING) ×2 IMPLANT
WIRE J 3MM .035X145CM (WIRE) ×2 IMPLANT

## 2015-12-03 NOTE — Op Note (Signed)
[facility]   PROCEDURE NOTE  PROCEDURE: 1. Right IJ central venous catheter placement 2. Right IJ cannulation under ultrasound guidance 3. Fluoroscopic guidance for placement of catheter.  PRE-OPERATIVE DIAGNOSIS: ischemic leg  POST-OPERATIVE DIAGNOSIS: same as above  SURGEON: Teresea Donley, MD  ANESTHESIA:  None  ESTIMATED BLOOD LOSS: minimal  FINDING(S): none  SPECIMEN(S):  none  INDICATIONS:   Joshua [name] is a 60 y.o. male who presents with need for venous access.  The patient presents for central venous catheter placement.  The patient is aware the risks of central venous catheter placement include but are not limited to: bleeding, infection, central venous injury, pneumothorax, possible venous stenosis, possible malpositioning in the venous system, and possible infections related to long-term catheter presence. The patient was aware of these risks and agreed to proceed.  DESCRIPTION: After written informed consent was obtained from the patient and/or family, the patient was placed supine in the hospital bed.  The patient was prepped with chloraprep and draped in the standard fashion for a chest or neck central venous catheter placement.  I anesthesized the neck cannulation site with 1% lidocaine then under ultrasound guidance, the right internal jugular  vein was cannulated with the 18 gauge needle.  A J wire was then placed down in the superior vena cava.  After a skin nick and dilatation, the triple lumen central venous catheter was placed over the wire and the wire was removed.  Each port was aspirated and flushed with sterile normal saline.  The catheter was secured in placed with three interrupted stitches of 3-0 Silk tied to the catheter.  The catheter was dressed with sterile dressing.  Fluoroscopy was used to park the catheter tip at the cavoatrial junction.  COMPLICATIONS: none apparent  CONDITION: stable  Kimmi Acocella 12/03/2015, 2:51  PM

## 2015-12-03 NOTE — Op Note (Signed)
[facility] Percutaneous Study/Intervention Procedural Note   Date of Surgery: 12/03/2015  Surgeon(s):Ermin Parisien   Assistants:none  Pre-operative Diagnosis: PAD with rest pain right lower extremity, thrombosis of recent intervention.  Post-operative diagnosis: Same  Procedure(s) Performed: 1. Ultrasound guidance for vascular access left femoral artery 2. Catheter placement into right popliteal artery from left femoral approach 3. Aortogram and selective right lower extremity angiogram 4. Catheter directed thrombolysis with 8 mg of TPA instilled throughout the SFA and above-knee popliteal artery with the AngioJet Omni catheter and a power pulse spray fashion 5. Placement of a 130 cm total length 50 cm working length lytic catheter for continuous infusion of TPA overnight in the right SFA and popliteal artery    EBL: 25 cc  Contrast: 35 cc  Fluoro Time: 2.6 minutes  Moderate Conscious Sedation Time: approximately 40 minutes using 4 mg of Versed and 100 mcg of Fentanyl  Indications: Patient is a 60 y.o.male with occlusion of a recent intervention on the right lower extremity with recurrent rest pain. The patient has noninvasive study showing thrombosis of the right leg intervention. The patient is brought in for angiography for further evaluation and potential treatment. Risks and benefits are discussed and informed consent is obtained  Procedure: The patient was identified and appropriate procedural time out was performed. The patient was then placed supine on the table and prepped and draped in the usual sterile fashion.Moderate conscious sedation was administered during a face to face encounter with the patient throughout the procedure with my supervision of the RN administering medicines and monitoring the patient's vital signs, pulse oximetry, telemetry and mental status  throughout from the start of the procedure until the patient was taken to the recovery room. Ultrasound was used to evaluate the left common femoral artery. It was patent . A digital ultrasound image was acquired. A Seldinger needle was used to access the left common femoral artery under direct ultrasound guidance and a permanent image was performed. A 0.035 J wire was advanced without resistance and a 5Fr sheath was placed. Pigtail catheter was placed into the aorta and an AP aortogram was performed. This demonstrated normal renal arteries and normal aorta and iliac segments without significant stenosis. I then crossed the aortic bifurcation and advanced to the right femoral head. Selective right lower extremity angiogram was then performed. This demonstrated thrombosis of the SFA and popliteal artery just below the SFA origin with reconstitution below the previously placed stents popliteal artery just above the knee. What appeared to be one good vessel runoff distally although the peroneal artery may have also been patent.. The patient was systemically heparinized and a 6 [place] Ansell sheath was then placed over the [employer] wire. The advantage wire went through the occlusion with no resistance whatsoever confirming the relatively fresh thrombus. I elected to instill TPA and a power pulse spray fashion with the AngioJet Omni catheter. 8 mg of TPA were instilled throughout the right superficial femoral artery and above-knee popliteal artery in the power pulse spray fashion. I then elected to place a lytic catheter for continued lysis overnight to help break up the thrombosis. A 130 cm total length 50 cm working length catheter was then parked from the popliteal artery up to the common femoral artery encompassing the entire SFA with the sheath sitting in the common femoral artery on the right at its tip. The sheath and the lytic catheter were then secured in place. I elected to terminate the procedure.  A central  line was placed which will be dictated separately. The patient was taken to the recovery room in stable condition having tolerated the procedure well.  Findings:  Aortogram: normal renal arteries, normal aorta and iliac segments Right Lower Extremity: Thrombosis of the SFA and popliteal artery just below the SFA origin with reconstitution below the previously placed stents popliteal artery just above the knee. What appeared to be one good vessel runoff distally although the peroneal artery may have also been patent.   Disposition: Patient was taken to the recovery room in stable condition having tolerated the procedure well.  Complications: None  Zymere Patlan 12/03/2015 2:43 PM

## 2015-12-03 NOTE — H&P (Signed)
  [facility] History & Physical Update  The patient was interviewed and re-examined.  The patient's previous History and Physical has been reviewed and is unchanged.  There is no change in the plan of care. We plan to proceed with the scheduled procedure.  DEW,JASON, MD  12/03/2015, 1:43 PM

## 2015-12-04 ENCOUNTER — Encounter: Payer: Self-pay | Admitting: Registered Nurse

## 2015-12-04 ENCOUNTER — Encounter
Admission: RE | Disposition: A | Discharge status: Home / Self Care | Payer: Self-pay | Source: Ambulatory Visit | Attending: Vascular Surgery

## 2015-12-04 ENCOUNTER — Inpatient Hospital Stay: Payer: BLUE CROSS/BLUE SHIELD

## 2015-12-04 ENCOUNTER — Encounter: Payer: Self-pay | Admitting: *Deleted

## 2015-12-04 HISTORY — PX: PERIPHERAL VASCULAR CATHETERIZATION: SHX172C

## 2015-12-04 LAB — BASIC METABOLIC PANEL
Anion gap: 4 — ABNORMAL LOW (ref 5–15)
BUN: 15 mg/dL (ref 6–20)
CO2: 29 mmol/L (ref 22–32)
Calcium: 8.7 mg/dL — ABNORMAL LOW (ref 8.9–10.3)
Chloride: 108 mmol/L (ref 101–111)
Creatinine, Ser: 1.06 mg/dL (ref 0.61–1.24)
Glucose, Bld: 102 mg/dL — ABNORMAL HIGH (ref 65–99)
Potassium: 4.1 mmol/L (ref 3.5–5.1)
Sodium: 141 mmol/L (ref 135–145)

## 2015-12-04 LAB — CBC
HCT: 32 % — ABNORMAL LOW (ref 40.0–52.0)
HCT: 32.8 % — ABNORMAL LOW (ref 40.0–52.0)
HEMOGLOBIN: 11 g/dL — AB (ref 13.0–18.0)
Hemoglobin: 10.6 g/dL — ABNORMAL LOW (ref 13.0–18.0)
MCH: 28.3 pg (ref 26.0–34.0)
MCH: 28.7 pg (ref 26.0–34.0)
MCHC: 33.1 g/dL (ref 32.0–36.0)
MCHC: 33.7 g/dL (ref 32.0–36.0)
MCV: 85.6 fL (ref 80.0–100.0)
MCV: 85.1 fL (ref 80.0–100.0)
PLATELETS: 119 10*3/uL — AB (ref 150–440)
Platelets: 116 10*3/uL — ABNORMAL LOW (ref 150–440)
RBC: 3.74 MIL/uL — ABNORMAL LOW (ref 4.40–5.90)
RBC: 3.85 MIL/uL — ABNORMAL LOW (ref 4.40–5.90)
RDW: 13.1 % (ref 11.5–14.5)
RDW: 13 % (ref 11.5–14.5)
WBC: 4.1 10*3/uL (ref 3.8–10.6)
WBC: 5.2 10*3/uL (ref 3.8–10.6)

## 2015-12-04 LAB — GLUCOSE, CAPILLARY
GLUCOSE-CAPILLARY: 98 mg/dL (ref 65–99)
Glucose-Capillary: 76 mg/dL (ref 65–99)
Glucose-Capillary: 103 mg/dL — ABNORMAL HIGH (ref 65–99)

## 2015-12-04 LAB — PROTIME-INR
INR: 1.3
Prothrombin Time: 16.3 seconds — ABNORMAL HIGH (ref 11.4–15.0)

## 2015-12-04 LAB — FIBRINOGEN
Fibrinogen: 160 mg/dL — ABNORMAL LOW (ref 210–470)
Fibrinogen: 136 mg/dL — ABNORMAL LOW (ref 210–470)

## 2015-12-04 SURGERY — LOWER EXTREMITY ANGIOGRAPHY
Anesthesia: Moderate Sedation | Laterality: Right

## 2015-12-04 MED ORDER — SODIUM CHLORIDE 0.9% FLUSH
10.0000 mL | Freq: Two times a day (BID) | INTRAVENOUS | Status: DC
  Administered 2015-12-04 – 2015-12-05 (×3): 30 mL

## 2015-12-04 MED ORDER — SODIUM CHLORIDE 0.9% FLUSH: 10.0000 mL | INTRAVENOUS | Status: DC | PRN

## 2015-12-04 MED ORDER — MIDAZOLAM HCL 2 MG/2ML IJ SOLN
INTRAMUSCULAR | Status: DC | PRN
  Administered 2015-12-04 (×2): 2 mg via INTRAVENOUS

## 2015-12-04 MED ORDER — TIROFIBAN HCL IN NACL 5-0.9 MG/100ML-% IV SOLN
INTRAVENOUS | Status: AC
  Filled 2015-12-04: qty 100

## 2015-12-04 MED ORDER — FENTANYL CITRATE (PF) 100 MCG/2ML IJ SOLN
INTRAMUSCULAR | Status: AC
  Filled 2015-12-04: qty 2

## 2015-12-04 MED ORDER — HEPARIN SODIUM (PORCINE) 1000 UNIT/ML IJ SOLN
INTRAMUSCULAR | Status: DC | PRN
  Administered 2015-12-04: 3000 [IU] via INTRAVENOUS

## 2015-12-04 MED ORDER — WARFARIN - PHYSICIAN DOSING INPATIENT: Freq: Every day | Status: DC

## 2015-12-04 MED ORDER — TIROFIBAN HCL IN NACL 5-0.9 MG/100ML-% IV SOLN
0.1500 ug/kg/min | INTRAVENOUS | Status: DC
  Administered 2015-12-04 – 2015-12-05 (×3): 0.15 ug/kg/min via INTRAVENOUS
  Filled 2015-12-04: qty 100

## 2015-12-04 MED ORDER — FENTANYL CITRATE (PF) 100 MCG/2ML IJ SOLN
INTRAMUSCULAR | Status: DC | PRN
  Administered 2015-12-04 (×2): 50 ug via INTRAVENOUS

## 2015-12-04 MED ORDER — MIDAZOLAM HCL 5 MG/5ML IJ SOLN
INTRAMUSCULAR | Status: AC
  Filled 2015-12-04: qty 5

## 2015-12-04 MED ORDER — TIROFIBAN HCL IV 12.5 MG/250 ML
INTRAVENOUS | Status: AC
  Filled 2015-12-04: qty 250

## 2015-12-04 MED ORDER — LIDOCAINE-EPINEPHRINE (PF) 1 %-1:200000 IJ SOLN
INTRAMUSCULAR | Status: AC
  Filled 2015-12-04: qty 30

## 2015-12-04 MED ORDER — HEPARIN (PORCINE) IN NACL 2-0.9 UNIT/ML-% IJ SOLN
INTRAMUSCULAR | Status: AC
  Filled 2015-12-04: qty 1000

## 2015-12-04 MED ORDER — WARFARIN SODIUM 5 MG PO TABS
5.0000 mg | ORAL_TABLET | Freq: Every day | ORAL | Status: DC
  Administered 2015-12-04: 5 mg via ORAL
  Filled 2015-12-04 (×2): qty 1

## 2015-12-04 MED ORDER — IOPAMIDOL (ISOVUE-300) INJECTION 61%
INTRAVENOUS | Status: DC | PRN
  Administered 2015-12-04: 35 mL via INTRA_ARTERIAL

## 2015-12-04 SURGICAL SUPPLY — 10 items
BALLN LUTONIX DCB 6X100X130 (BALLOONS) ×3
BALLOON LUTONIX DCB 6X100X130 (BALLOONS) ×2 IMPLANT
DEVICE PRESTO INFLATION (MISCELLANEOUS) ×3 IMPLANT
DEVICE SOLENT OMNI 120CM (CATHETERS) ×3 IMPLANT
DEVICE STARCLOSE SE CLOSURE (Vascular Products) ×3 IMPLANT
LIFESTENT 7X60X130 (Permanent Stent) ×3 IMPLANT
PACK ANGIOGRAPHY (CUSTOM PROCEDURE TRAY) ×3 IMPLANT
PREP CHG 10.5 TEAL (MISCELLANEOUS) ×3 IMPLANT
SHEATH PINNACLE ST 6F 45CM (SHEATH) ×3 IMPLANT
WIRE MAGIC TORQUE 260C (WIRE) ×3 IMPLANT

## 2015-12-04 NOTE — Op Note (Signed)
[facility] Percutaneous Study/Intervention Procedural Note   Date of Surgery:  12/04/2015  Surgeon(s):Zaray Gatchel   Assistants:none  Pre-operative Diagnosis: PAD with rest pain RLE, s/p overnight thrombolysis  Post-operative diagnosis: Same  Procedure(s) Performed:  1. Catheter placement into right popliteal artery from left femoral approach 2. Selective right lower extremity angiogram 3. Percutaneous transluminal angioplasty of proximal right SFA with 6 mm x 10 cm Lutonix drug coated angioplasty balloon 4. Mechanical rheolytic thrombectomy of the right SFA and popliteal arteries with the Angiojet Proxy catheter  5.  Lifestent placement to the proximal SFA for residual stenosis after angioplasty using a 7 mm x 6 cm stent. 6. StarClose closure device left femoral artery  EBL: minimal  Contrast: 35 cc  Fluoro Time: 3 minutes  Moderate Conscious Sedation Time: approximately 35 minutes using 4 mg of Versed and 100 mcg of Fentanyl  Indications: Patient is a 60 y.o.male with rest pain of the right lower extremity with recurrent thrombosis of previous interventions. The patient has been on TPA overnight. The patient is brought in for angiography for further evaluation and potential treatment. Risks and benefits are discussed and informed consent is obtained  Procedure: The patient was identified and appropriate procedural time out was performed. The patient was then placed supine on the table and prepped and draped in the usual sterile fashion.Moderate conscious sedation was administered during a face to face encounter with the patient throughout the procedure with my supervision of the RN administering medicines and monitoring the patient's vital signs, pulse oximetry, telemetry and mental status throughout from the start of the procedure until the patient was taken to the  recovery room. A Magic torque wire was placed and the existing lysis catheter and sheath were removed. A new sheath it was more appropriately sized was then placed over the wire and parked in the proximal superficial femoral artery. Selective right lower extremity angiogram was then performed. This demonstrated mild residual thrombus in the SFA and proximal popliteal artery within previously placed stents. There was a high-grade stenosis of the SFA proximal to the previously placed stent about 3-4 cm beyond the origin of the SFA. The patient was systemically heparinized. I then used a AngioJet proxy catheter to treat the SFA and popliteal thrombus with mechanical rheolytic thrombectomy. Approximately 115 cc of effluent was returned. The proximal SFA stenosis was then treated with a 6 mm diameter by 10 cm length Lutonix drug-coated angioplasty balloon. This was inflated to 8 atm for 1 minute. Angiogram following these interventions showed greater than 50% residual stenosis in the proximal SFA and resolution of the remainder of the SFA and popliteal thrombus. I then treated the proximal SFA with a 7 mm diameter by 6 cm length life stent postdilated with a 6 mm balloon which started about 1-2 cm beyond the origin of the SFA and encompass the lesion. The distal end of the stent went just into the previously placed stents. Completion and gram now showed brisk flow with the posterior tibial artery being the dominant runoff to the foot with some flow in the peroneal artery as well and no significant residual stenosis or thrombosis. I elected to terminate the procedure. The sheath was removed and StarClose closure device was deployed in the left femoral artery with excellent hemostatic result. The patient was taken to the recovery room in stable condition having tolerated the procedure well.  Findings:   Right Lower Extremity: mild residual thrombosis in the SFA, high grade stenosis in the  proximal SFA just above the previous stent   Disposition: Patient was taken to the recovery room in stable condition having tolerated the procedure well.  Complications: None  Cedric Denison 12/04/2015 3:29 PM

## 2015-12-04 NOTE — H&P (Signed)
   [facility] History & Physical Update  The patient was interviewed and re-examined.  The patient's previous History and Physical has been reviewed and is unchanged.  There is no change in the plan of care. We plan to proceed with the scheduled procedure.  DEW,JASON, MD  12/04/2015, 1:55 PM

## 2015-12-04 NOTE — Progress Notes (Signed)
Pt NPO for angiogram this AM. Continue on heparin and TPA through left groin sheath. No pain reported this shift. Was SR/SB on the monitor.

## 2015-12-05 ENCOUNTER — Encounter: Payer: Self-pay | Admitting: Vascular Surgery

## 2015-12-05 LAB — BASIC METABOLIC PANEL
Anion gap: 4 — ABNORMAL LOW (ref 5–15)
BUN: 13 mg/dL (ref 6–20)
CHLORIDE: 106 mmol/L (ref 101–111)
CO2: 30 mmol/L (ref 22–32)
CREATININE: 1.1 mg/dL (ref 0.61–1.24)
Calcium: 8.5 mg/dL — ABNORMAL LOW (ref 8.9–10.3)
GFR calc Af Amer: >60 mL/min (ref >60)
GFR calc non Af Amer: >60 mL/min (ref >60)
Glucose, Bld: 108 mg/dL — ABNORMAL HIGH (ref 65–99)
Potassium: 3.8 mmol/L (ref 3.5–5.1)
Sodium: 140 mmol/L (ref 135–145)

## 2015-12-05 LAB — PROTIME-INR
INR: 1.2
PROTHROMBIN TIME: 15.4 s — AB (ref 11.4–15.0)

## 2015-12-05 LAB — CBC
HCT: 31 % — ABNORMAL LOW (ref 40.0–52.0)
HEMOGLOBIN: 10.4 g/dL — AB (ref 13.0–18.0)
MCH: 28.7 pg (ref 26.0–34.0)
MCHC: 33.5 g/dL (ref 32.0–36.0)
MCV: 85.6 fL (ref 80.0–100.0)
Platelets: 108 10*3/uL — ABNORMAL LOW (ref 150–440)
RBC: 3.62 MIL/uL — ABNORMAL LOW (ref 4.40–5.90)
RDW: 13.3 % (ref 11.5–14.5)
WBC: 5.4 10*3/uL (ref 3.8–10.6)

## 2015-12-05 LAB — GLUCOSE, CAPILLARY: Glucose-Capillary: 99 mg/dL (ref 65–99)

## 2015-12-05 MED ORDER — HYDROCODONE-ACETAMINOPHEN 5-325 MG PO TABS: 1.0000 | ORAL_TABLET | ORAL | Status: DC | PRN

## 2015-12-05 MED ORDER — TIROFIBAN HCL IN NACL 5-0.9 MG/100ML-% IV SOLN
INTRAVENOUS | Status: AC
  Filled 2015-12-05: qty 100

## 2015-12-05 MED ORDER — ENOXAPARIN SODIUM 150 MG/ML ~~LOC~~ SOLN: 1.0000 mg/kg | Freq: Two times a day (BID) | SUBCUTANEOUS | Status: DC

## 2015-12-05 MED ORDER — ENOXAPARIN SODIUM 100 MG/ML ~~LOC~~ SOLN
1.0000 mg/kg | Freq: Two times a day (BID) | SUBCUTANEOUS | Status: DC
  Administered 2015-12-05: 100 mg via SUBCUTANEOUS
  Filled 2015-12-05: qty 1

## 2015-12-05 MED ORDER — TIROFIBAN HCL IV 12.5 MG/250 ML
INTRAVENOUS | Status: AC
  Filled 2015-12-05: qty 250

## 2015-12-05 MED ORDER — WARFARIN SODIUM 5 MG PO TABS: 5.0000 mg | ORAL_TABLET | Freq: Every day | ORAL | Status: DC

## 2015-12-05 NOTE — Progress Notes (Signed)
Dr Lucky Cowboy has ordered for the patient to be discharged once lovenox injection teaching has been performed and patient feels comfortable.  Education given with teachback, along with handouts on lovenox injections.  Patient feels competent to perform this on himself, plus his mother whom he lives with is a Marine scientist and can help him if he needs it.

## 2015-12-05 NOTE — Progress Notes (Signed)
[facility]  Daily Progress Note   Subjective  - 1 Day Post-Op  Feels fine Legs not hurting Tolerating diet  Objective Filed Vitals:   12/05/15 0500 12/05/15 0559 12/05/15 0600 12/05/15 0700  BP: 106/67  109/55 97/61  Pulse: 59  55 61  Temp:      TempSrc:      Resp: '9  10 11  '$ Height:      Weight:  98.1 kg (216 lb 4.3 oz)    SpO2: 95%  94% 97%    Intake/Output Summary (Last 24 hours) at 12/05/15 1003 Last data filed at 12/05/15 0815  Gross per 24 hour  Intake 2232.99 ml  Output   2800 ml  Net -567.01 ml    PULM  CTAB CV  RRR VASC  2+ right PT pulse, access site C/D/I  Laboratory CBC    Component Value Date/Time   WBC 5.4 12/05/2015 0450   HGB 10.4* 12/05/2015 0450   HCT 31.0* 12/05/2015 0450   PLT 108* 12/05/2015 0450    BMET    Component Value Date/Time   NA 140 12/05/2015 0450   K 3.8 12/05/2015 0450   CL 106 12/05/2015 0450   CO2 30 12/05/2015 0450   GLUCOSE 108* 12/05/2015 0450   BUN 13 12/05/2015 0450   BUN 16 07/03/2014 1205   CREATININE 1.10 12/05/2015 0450   CREATININE 1.34* 07/03/2014 1205   CALCIUM 8.5* 12/05/2015 0450   GFRNONAA >60 12/05/2015 0450   GFRNONAA 59* 07/03/2014 1205   GFRAA >60 12/05/2015 0450   GFRAA >60 07/03/2014 1205    Assessment/Planning: POD #1/2 s/p RLE revascularization   Doing well  No signs of bleeding  Perfusion intact  Will stop Aggrastat and switch over to Lovenox 100 mg bid to bridge coumadin which was started last night  Can  Likely go home later today if home Lovenox can be arranged.  Follow up in my office Friday for INR check    Joshua [name]  12/05/2015, 10:03 AM

## 2015-12-05 NOTE — Care Management (Signed)
Chart reviewed and met with patient. He uses [facility]. Called Lovenox RX to [facility]. Patient has no copay. He has been instructed by staff and feels comfortable with injection. His mom is a nurse also and is going to help.  

## 2015-12-10 NOTE — Discharge Summary (Signed)
[facility]    Discharge Summary    Patient ID:  Joshua [name] MRN: [id number] DOB/AGE: [birth year] 60 y.o.  Admit date: 12/03/2015 Discharge date: 12/10/2015 Date of Surgery: 12/03/2015 - 12/04/2015 Surgeon: Juliann Mule): Algernon Huxley, MD  Admission Diagnosis: Rt Lower Extr ASO with Rest Pain  ischemic leg  Discharge Diagnoses:  Rt Lower Extr ASO with Rest Pain  ischemic leg  Secondary Diagnoses: Past Medical History  Diagnosis Date  . Hypertension   . Peripheral vascular disease ([place])     Procedure(s): Lower Extremity Angiography Lower Extremity Intervention  Discharged Condition: good  HPI:  Patient brought in for recurrent rest pain in the right leg with failure of previous interventions  Hospital Course:  Joshua [name] is a 60 y.o. male is S/P right Procedure(s): Lower Extremity Angiography Lower Extremity Intervention Extubated: NA Physical exam: 2+ pedal pulses Post-op wounds healing well Pt. Ambulating, voiding and taking PO diet without difficulty. Pt pain controlled with PO pain meds. Labs as below Complications:none  Consults:     Significant Diagnostic Studies: CBC Lab Results  Component Value Date   WBC 5.4 12/05/2015   HGB 10.4* 12/05/2015   HCT 31.0* 12/05/2015   MCV 85.6 12/05/2015   PLT 108* 12/05/2015    BMET    Component Value Date/Time   NA 140 12/05/2015 0450   K 3.8 12/05/2015 0450   CL 106 12/05/2015 0450   CO2 30 12/05/2015 0450   GLUCOSE 108* 12/05/2015 0450   BUN 13 12/05/2015 0450   BUN 16 07/03/2014 1205   CREATININE 1.10 12/05/2015 0450   CREATININE 1.34* 07/03/2014 1205   CALCIUM 8.5* 12/05/2015 0450   GFRNONAA >60 12/05/2015 0450   GFRNONAA 59* 07/03/2014 1205   GFRAA >60 12/05/2015 0450   GFRAA >60 07/03/2014 1205   COAG Lab Results  Component Value Date   INR 1.20 12/05/2015   INR 1.30 12/04/2015   INR 1.15 10/09/2015     Disposition:  Discharge to :home     Medication List    STOP taking these medications        apixaban 5 MG Tabs tablet  Commonly known as:  ELIQUIS      TAKE these medications        aspirin 81 MG tablet  Take 81 mg by mouth daily.     enoxaparin 150 MG/ML injection  Commonly known as:  LOVENOX  Inject 0.65 mLs (100 mg total) into the skin every 12 (twelve) hours.     hydrochlorothiazide 12.5 MG capsule  Commonly known as:  MICROZIDE  Take 12.5 mg by mouth daily.     HYDROcodone-acetaminophen 5-325 MG tablet  Commonly known as:  NORCO/VICODIN  Take 1-2 tablets by mouth every 4 (four) hours as needed for moderate pain.     lisinopril 10 MG tablet  Commonly known as:  PRINIVIL,ZESTRIL  Take 10 mg by mouth daily.     simvastatin 20 MG tablet  Commonly known as:  ZOCOR  Take 20 mg by mouth daily.     warfarin 5 MG tablet  Commonly known as:  COUMADIN  Take 1 tablet (5 mg total) by mouth daily at 6 PM.       Verbal and written Discharge instructions given to the patient. Wound care per Discharge AVS     Follow-up Information    Follow up with Karrine Kluttz, MD In 2 days.   Specialties:  Vascular Surgery, Radiology, Interventional Cardiology   Why:  for  INR check this Friday, 5/19 at 9:00 am   Contact information:   [place] [address] [phone]       Signed: Leotis Pain, MD  12/10/2015, 9:33 AM

## 2016-04-07 DIAGNOSIS — Y999 Unspecified external cause status: Secondary | ICD-10-CM | POA: Diagnosis not present

## 2016-04-07 DIAGNOSIS — S39012A Strain of muscle, fascia and tendon of lower back, initial encounter: Secondary | ICD-10-CM | POA: Diagnosis not present

## 2016-04-07 DIAGNOSIS — Z7901 Long term (current) use of anticoagulants: Secondary | ICD-10-CM | POA: Insufficient documentation

## 2016-04-07 DIAGNOSIS — Z7982 Long term (current) use of aspirin: Secondary | ICD-10-CM | POA: Diagnosis not present

## 2016-04-07 DIAGNOSIS — Y929 Unspecified place or not applicable: Secondary | ICD-10-CM | POA: Insufficient documentation

## 2016-04-07 DIAGNOSIS — I10 Essential (primary) hypertension: Secondary | ICD-10-CM | POA: Diagnosis not present

## 2016-04-07 DIAGNOSIS — Y9389 Activity, other specified: Secondary | ICD-10-CM | POA: Insufficient documentation

## 2016-04-07 DIAGNOSIS — Z87891 Personal history of nicotine dependence: Secondary | ICD-10-CM | POA: Diagnosis not present

## 2016-04-07 DIAGNOSIS — X500XXA Overexertion from strenuous movement or load, initial encounter: Secondary | ICD-10-CM | POA: Insufficient documentation

## 2016-04-07 DIAGNOSIS — S3992XA Unspecified injury of lower back, initial encounter: Secondary | ICD-10-CM | POA: Diagnosis present

## 2016-04-07 DIAGNOSIS — Z79899 Other long term (current) drug therapy: Secondary | ICD-10-CM | POA: Insufficient documentation

## 2016-04-08 ENCOUNTER — Emergency Department
Admission: EM | Admit: 2016-04-08 | Discharge: 2016-04-08 | Disposition: A | Discharge status: Home / Self Care | Payer: BLUE CROSS/BLUE SHIELD | Attending: Emergency Medicine | Admitting: Emergency Medicine

## 2016-04-08 ENCOUNTER — Encounter: Payer: Self-pay | Admitting: Emergency Medicine

## 2016-04-08 DIAGNOSIS — M62830 Muscle spasm of back: Secondary | ICD-10-CM

## 2016-04-08 DIAGNOSIS — S39012A Strain of muscle, fascia and tendon of lower back, initial encounter: Secondary | ICD-10-CM

## 2016-04-08 MED ORDER — DIAZEPAM 2 MG PO TABS
2.0000 mg | ORAL_TABLET | Freq: Once | ORAL | Status: AC
  Administered 2016-04-08: 2 mg via ORAL
  Filled 2016-04-08: qty 1

## 2016-04-08 MED ORDER — IBUPROFEN 800 MG PO TABS: 800.0000 mg | ORAL_TABLET | Freq: Three times a day (TID) | ORAL | 0 refills | Status: DC | PRN

## 2016-04-08 MED ORDER — HYDROCODONE-ACETAMINOPHEN 5-325 MG PO TABS
1.0000 | ORAL_TABLET | Freq: Once | ORAL | Status: AC
  Administered 2016-04-08: 1 via ORAL
  Filled 2016-04-08: qty 1

## 2016-04-08 MED ORDER — DIAZEPAM 2 MG PO TABS: 2.0000 mg | ORAL_TABLET | Freq: Three times a day (TID) | ORAL | 0 refills | Status: DC | PRN

## 2016-04-08 MED ORDER — HYDROCODONE-ACETAMINOPHEN 5-325 MG PO TABS: 1.0000 | ORAL_TABLET | Freq: Four times a day (QID) | ORAL | 0 refills | Status: DC | PRN

## 2016-04-08 MED ORDER — KETOROLAC TROMETHAMINE 30 MG/ML IJ SOLN
60.0000 mg | Freq: Once | INTRAMUSCULAR | Status: AC
  Administered 2016-04-08: 60 mg via INTRAMUSCULAR
  Filled 2016-04-08: qty 2

## 2016-04-08 NOTE — ED Notes (Signed)
Pt states that he injured his back approximately a month ago and thinks that he pulled a muscle in his back.  Pt states that he has a hard time going from a sitting to a standing position.  Pt appears in no acute distress at this time.  Pt has a history of back surgery which is states "I think they fused my L4 and L5".  Pt also states that he has been taking tylenol and left over hydrocodone 5/325 from a previous leg surgery.  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

## 2016-04-08 NOTE — ED Provider Notes (Signed)
[facility] Emergency Department Provider Note   ____________________________________________   First MD Initiated Contact with Patient 04/08/16 [phone]     (approximate)  I have reviewed the triage vital signs and the nursing notes.   HISTORY  Chief Complaint Back Pain    HPI Joshua [name] is a 60 y.o. male who presents to the ED from home via EMS with a chief complaint of low back pain.Patient has a history of back surgery who injured his back approximately one month ago lifting a heavy bucket filled with water. Reports pain was worse last evening. Denies radiation to legs. Denies numbness/tingling, extremity weakness, bowel or bladder incontinence. Denies recent fever, chills, chest pain, shortness of breath, abdominal pain, nausea, vomiting, diarrhea. Denies recent trauma. Nothing makes his pain better. Movement makes his pain worse.   Past Medical History:  Diagnosis Date  . Hypertension   . Peripheral vascular disease [facility])     Patient Active Problem List   Diagnosis Date Noted  . 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 Ischemic leg 01/02/2015    Past Surgical History:  Procedure Laterality Date  . BACK SURGERY    . PERIPHERAL VASCULAR CATHETERIZATION Right 01/02/2015   Procedure: Lower Extremity Angiography;  Surgeon: Katha Cabal, MD;  Location: [place] [facility];  Service: Cardiovascular;  Laterality: Right;  . PERIPHERAL VASCULAR CATHETERIZATION Right 06/18/2015   Procedure: Lower Extremity Angiography;  Surgeon: Algernon Huxley, MD;  Location: [place] [facility];  Service: Cardiovascular;  Laterality: Right;  . PERIPHERAL VASCULAR CATHETERIZATION  06/18/2015   Procedure: Lower Extremity Intervention;  Surgeon: Algernon Huxley, MD;  Location: [place] [facility]  [facility];  Service: Cardiovascular;;  . PERIPHERAL VASCULAR CATHETERIZATION N/A 10/09/2015   Procedure: Abdominal Aortogram w/Lower Extremity;  Surgeon: Katha Cabal, MD;  Location: [place] CV LAB;  Service: Cardiovascular;  Laterality: N/A;  . PERIPHERAL VASCULAR CATHETERIZATION  10/09/2015   Procedure: Lower Extremity Intervention;  Surgeon: Katha Cabal, MD;  Location: [place] CV LAB;  Service: Cardiovascular;;  . PERIPHERAL VASCULAR CATHETERIZATION Right 10/10/2015   Procedure: Lower Extremity Angiography;  Surgeon: Algernon Huxley, MD;  Location: [place] CV LAB;  Service: Cardiovascular;  Laterality: Right;  . PERIPHERAL VASCULAR CATHETERIZATION Right 10/10/2015   Procedure: Lower Extremity Intervention;  Surgeon: Algernon Huxley, MD;  Location: [place] CV LAB;  Service: Cardiovascular;  Laterality: Right;  . PERIPHERAL VASCULAR CATHETERIZATION Right 12/03/2015   Procedure: Lower Extremity Angiography;  Surgeon: Algernon Huxley, MD;  Location: [place] CV LAB;  Service: Cardiovascular;  Laterality: Right;  . PERIPHERAL VASCULAR CATHETERIZATION Right 12/04/2015   Procedure: Lower Extremity Angiography;  Surgeon: Algernon Huxley, MD;  Location: [place] CV LAB;  Service: Cardiovascular;  Laterality: Right;  . PERIPHERAL VASCULAR CATHETERIZATION  12/04/2015   Procedure: Lower Extremity Intervention;  Surgeon: Algernon Huxley, MD;  Location: [place] CV LAB;  Service: Cardiovascular;;  . stent placement in right leg Right     Prior to Admission medications   Medication Sig Start Date End Date Taking? Authorizing Provider  aspirin 81 MG tablet Take 81 mg by mouth daily.    Historical Provider, MD  diazepam (VALIUM) 2 MG tablet Take 1 tablet (2 mg total) by mouth every 8 (eight) hours as needed for muscle spasms. 04/08/16  Paulette Blanch, MD  enoxaparin (LOVENOX) 150 MG/ML injection Inject 0.65 mLs (100 mg total) into the skin every 12 (twelve) hours. 12/05/15   Algernon Huxley, MD    hydrochlorothiazide (MICROZIDE) 12.5 MG capsule Take 12.5 mg by mouth daily.    Historical Provider, MD  HYDROcodone-acetaminophen (NORCO) 5-325 MG tablet Take 1 tablet by mouth every 6 (six) hours as needed for moderate pain. 04/08/16   Paulette Blanch, MD  ibuprofen (ADVIL,MOTRIN) 800 MG tablet Take 1 tablet (800 mg total) by mouth every 8 (eight) hours as needed for moderate pain. 04/08/16   Paulette Blanch, MD  lisinopril (PRINIVIL,ZESTRIL) 10 MG tablet Take 10 mg by mouth daily.    Historical Provider, MD  simvastatin (ZOCOR) 20 MG tablet Take 20 mg by mouth daily.    Historical Provider, MD  warfarin (COUMADIN) 5 MG tablet Take 1 tablet (5 mg total) by mouth daily at 6 PM. 12/05/15   Algernon Huxley, MD    Allergies Review of patient's allergies indicates no known allergies.  No family history on file.  Social History Social History  Substance Use Topics  . Smoking status: Former Smoker    Packs/day: 1.50    Types: Cigarettes    Quit date: 12/20/2014  . Smokeless tobacco: Former Systems developer  . Alcohol use No    Review of Systems  Constitutional: No fever/chills. Eyes: No visual changes. ENT: No sore throat. Cardiovascular: Denies chest pain. Respiratory: Denies shortness of breath. Gastrointestinal: No abdominal pain.  No nausea, no vomiting.  No diarrhea.  No constipation. Genitourinary: Negative for dysuria. Musculoskeletal: Positive for back pain. Skin: Negative for rash. Neurological: Negative for headaches, focal weakness or numbness.  10-point ROS otherwise negative.  ____________________________________________   PHYSICAL EXAM:  VITAL SIGNS: ED Triage Vitals  Enc Vitals Group     BP 04/08/16 0013 100/71     Pulse Rate 04/08/16 0013 68     Resp 04/08/16 0013 20     Temp 04/08/16 0013 98 F (36.7 C)     Temp Source 04/08/16 0013 Oral     SpO2 04/08/16 0013 100 %     Weight 04/08/16 0012 210 lb (95.3 kg)     Height 04/08/16 0012 6' (1.829 m)     Head Circumference --       Peak Flow --      Pain Score 04/08/16 0012 8     Pain Loc --      Pain Edu? --      Excl. in [place]? --     Constitutional: Alert and oriented. Well appearing and in no acute distress. Eyes: Conjunctivae are normal. PERRL. EOMI. Head: Atraumatic. Nose: No congestion/rhinnorhea. Mouth/Throat: Mucous membranes are moist.  Oropharynx non-erythematous. Neck: No stridor.   Cardiovascular: Normal rate, regular rhythm. Grossly normal heart sounds.  Good peripheral circulation. Respiratory: Normal respiratory effort.  No retractions. Lungs CTAB. Gastrointestinal: Soft and nontender. No distention. No abdominal bruits. No CVA tenderness. Musculoskeletal: No midline spinal tenderness. Paraspinal lumbar spasms. Limited range of motion on movement secondary to discomfort. Negative straight leg raise bilaterally. No lower extremity tenderness nor edema.  No joint effusions. Neurologic:  Normal speech and language. No gross focal neurologic deficits are appreciated.  Skin:  Skin is warm, dry and intact. No rash noted. Psychiatric: Mood and affect are normal. Speech and behavior are normal.  ____________________________________________   LABS (all labs ordered are listed, but only abnormal results are displayed)  Labs Reviewed - No data  to display ____________________________________________  EKG  None ____________________________________________  RADIOLOGY  None ____________________________________________   PROCEDURES  Procedure(s) performed: None  Procedures  Critical Care performed: No  ____________________________________________   INITIAL IMPRESSION / ASSESSMENT AND PLAN / ED COURSE  Pertinent labs & imaging results that were available during my care of the patient were reviewed by me and considered in my medical decision making (see chart for details).  61 year old male who presents with lumbar strain with muscle spasms. Will treat with NSAID, analgesia, muscle relaxer and  follow-up with orthopedics. Strict return precautions given. Patient verbalizes understanding and agrees with plan of care.  Clinical Course  Comment By Time  Pain improved. Strict return precautions given. Patient verbalizes understanding and agrees with plan of care. Paulette Blanch, MD 09/19 [phone]     ____________________________________________   FINAL CLINICAL IMPRESSION(S) / ED DIAGNOSES  Final diagnoses:  Low back strain, initial encounter  Muscle spasm of back      NEW MEDICATIONS STARTED DURING THIS VISIT:  New Prescriptions   DIAZEPAM (VALIUM) 2 MG TABLET    Take 1 tablet (2 mg total) by mouth every 8 (eight) hours as needed for muscle spasms.   HYDROCODONE-ACETAMINOPHEN (NORCO) 5-325 MG TABLET    Take 1 tablet by mouth every 6 (six) hours as needed for moderate pain.   IBUPROFEN (ADVIL,MOTRIN) 800 MG TABLET    Take 1 tablet (800 mg total) by mouth every 8 (eight) hours as needed for moderate pain.     Note:  This document was prepared using Dragon voice recognition software and may include unintentional dictation errors.    Paulette Blanch, MD 04/08/16 [phone]

## 2016-04-08 NOTE — Discharge Instructions (Signed)
1. You may take medicines as needed for pain and muscle spasms (Motrin/Norco/Valium). 2. Apply moist heat to affected area several times daily. 3. Return to the ER for worsening symptoms, persistent vomiting, losing control of your bowels or bladder, or other concerns.

## 2016-04-08 NOTE — ED Triage Notes (Signed)
Pt to triage via w/c with no distres noted, brought in by EMS; pt reports lower back pain after lifting heavy tub of water month ago; st hx back surgery

## 2016-04-08 NOTE — ED Notes (Signed)
Pt resting comfortably at this time with lights turned down and warm blanket provided.  Pt reports a decrease in pain.  No other needs at this time.

## 2016-04-25 ENCOUNTER — Other Ambulatory Visit: Payer: Self-pay | Admitting: Neurological Surgery

## 2016-04-25 DIAGNOSIS — M5416 Radiculopathy, lumbar region: Secondary | ICD-10-CM

## 2016-05-08 ENCOUNTER — Ambulatory Visit
Admission: RE | Admit: 2016-05-08 | Discharge: 2016-05-08 | Disposition: A | Discharge status: Home / Self Care | Payer: BLUE CROSS/BLUE SHIELD | Source: Ambulatory Visit | Attending: Neurological Surgery | Admitting: Neurological Surgery

## 2016-05-08 ENCOUNTER — Telehealth (INDEPENDENT_AMBULATORY_CARE_PROVIDER_SITE_OTHER): Payer: Self-pay | Admitting: Vascular Surgery

## 2016-05-08 ENCOUNTER — Ambulatory Visit (INDEPENDENT_AMBULATORY_CARE_PROVIDER_SITE_OTHER): Payer: BLUE CROSS/BLUE SHIELD

## 2016-05-08 ENCOUNTER — Other Ambulatory Visit (INDEPENDENT_AMBULATORY_CARE_PROVIDER_SITE_OTHER): Payer: Self-pay | Admitting: Vascular Surgery

## 2016-05-08 DIAGNOSIS — M5416 Radiculopathy, lumbar region: Secondary | ICD-10-CM

## 2016-05-08 DIAGNOSIS — M4807 Spinal stenosis, lumbosacral region: Secondary | ICD-10-CM | POA: Insufficient documentation

## 2016-05-08 DIAGNOSIS — M48061 Spinal stenosis, lumbar region without neurogenic claudication: Secondary | ICD-10-CM | POA: Insufficient documentation

## 2016-05-08 DIAGNOSIS — I739 Peripheral vascular disease, unspecified: Secondary | ICD-10-CM | POA: Insufficient documentation

## 2016-05-08 DIAGNOSIS — M5116 Intervertebral disc disorders with radiculopathy, lumbar region: Secondary | ICD-10-CM | POA: Insufficient documentation

## 2016-05-08 NOTE — Telephone Encounter (Signed)
Called patient to cancel INR and informed him he would be getting it done at the medical mall at [facility]. We need to get an order sent over.

## 2016-05-12 ENCOUNTER — Other Ambulatory Visit
Admission: RE | Admit: 2016-05-12 | Discharge: 2016-05-12 | Disposition: A | Discharge status: Home / Self Care | Payer: BLUE CROSS/BLUE SHIELD | Source: Ambulatory Visit | Attending: Vascular Surgery | Admitting: Vascular Surgery

## 2016-05-12 DIAGNOSIS — I739 Peripheral vascular disease, unspecified: Secondary | ICD-10-CM

## 2016-05-12 LAB — PROTIME-INR
INR: 1.9
Prothrombin Time: 22.1 seconds — ABNORMAL HIGH (ref 11.4–15.2)

## 2016-05-12 NOTE — Telephone Encounter (Signed)
A message for left for the patient to let him know that an order was in place for him to have an INR at the hospital. He was instructed to call back if he had questions. See previous notes.

## 2016-05-12 NOTE — Telephone Encounter (Signed)
An order is in the system for the patient and he will be called.

## 2016-05-15 ENCOUNTER — Telehealth (INDEPENDENT_AMBULATORY_CARE_PROVIDER_SITE_OTHER): Payer: Self-pay

## 2016-05-15 NOTE — Telephone Encounter (Signed)
Called the patient and let him know that his INR was viewed by Hezzie Bump and she wants him to alternate '7mg'$  and '8mg'$  then be rechecked in two weeks.

## 2016-05-16 ENCOUNTER — Other Ambulatory Visit (INDEPENDENT_AMBULATORY_CARE_PROVIDER_SITE_OTHER): Payer: Self-pay | Admitting: Vascular Surgery

## 2016-05-16 DIAGNOSIS — I739 Peripheral vascular disease, unspecified: Secondary | ICD-10-CM

## 2016-05-22 ENCOUNTER — Other Ambulatory Visit (INDEPENDENT_AMBULATORY_CARE_PROVIDER_SITE_OTHER): Payer: Self-pay | Admitting: Vascular Surgery

## 2016-06-05 ENCOUNTER — Encounter: Payer: Self-pay | Admitting: *Deleted

## 2016-06-10 DIAGNOSIS — M5416 Radiculopathy, lumbar region: Secondary | ICD-10-CM | POA: Insufficient documentation

## 2016-06-17 ENCOUNTER — Encounter: Payer: Self-pay | Admitting: General Surgery

## 2016-06-17 ENCOUNTER — Ambulatory Visit (INDEPENDENT_AMBULATORY_CARE_PROVIDER_SITE_OTHER): Payer: BLUE CROSS/BLUE SHIELD | Admitting: General Surgery

## 2016-06-17 VITALS — BP 122/58 | HR 74 | Resp 14 | Ht 72.0 in | Wt 212.0 lb

## 2016-06-17 DIAGNOSIS — I739 Peripheral vascular disease, unspecified: Secondary | ICD-10-CM

## 2016-06-17 NOTE — Progress Notes (Signed)
Patient ID: Joshua [name], male   DOB: Oct 06, 1955, 60 y.o.   MRN: [id number]  Chief Complaint  Patient presents with  . Other    right leg pain    HPI Joshua [name] is a 60 y.o. male here today for right leg pain. He wants a second opinion regarding his right leg pain/cramping which occurs around the calf area after walking. He states the pain lasts a few minutes and he sits down to relieve pain. Denies pain while laying down. Currently taking coumadin. He states he sees Dr Lucky Cowboy and Dr Delana Meyer for his PAD and has had multiple stents placed in various arteries. I have reviewed the history of present illness with the patient.   HPI  Past Medical History:  Diagnosis Date  . Hypertension   . Peripheral vascular disease [facility])     Past Surgical History:  Procedure Laterality Date  . BACK SURGERY  2011  . COLONOSCOPY  2013 ?  Marland Kitchen PERIPHERAL VASCULAR CATHETERIZATION Right 01/02/2015   Procedure: Lower Extremity Angiography;  Surgeon: Katha Cabal, MD;  Location: [place] [facility];  Service: Cardiovascular;  Laterality: Right;  . PERIPHERAL VASCULAR CATHETERIZATION Right 06/18/2015   Procedure: Lower Extremity Angiography;  Surgeon: Algernon Huxley, MD;  Location: [place] [facility];  Service: Cardiovascular;  Laterality: Right;  . PERIPHERAL VASCULAR CATHETERIZATION  06/18/2015   Procedure: Lower Extremity Intervention;  Surgeon: Algernon Huxley, MD;  Location: [place] [facility];  Service: Cardiovascular;;  . PERIPHERAL VASCULAR CATHETERIZATION N/A 10/09/2015   Procedure: Abdominal Aortogram w/Lower Extremity;  Surgeon: Katha Cabal, MD;  Location: [place] [facility];  Service: Cardiovascular;  Laterality: N/A;  . PERIPHERAL VASCULAR CATHETERIZATION  10/09/2015   Procedure: Lower Extremity Intervention;  Surgeon: Katha Cabal, MD;  Location: [place] [facility];  Service: Cardiovascular;;  . PERIPHERAL VASCULAR CATHETERIZATION Right 10/10/2015   Procedure: Lower  Extremity Angiography;  Surgeon: Algernon Huxley, MD;  Location: [place] [facility];  Service: Cardiovascular;  Laterality: Right;  . PERIPHERAL VASCULAR CATHETERIZATION Right 10/10/2015   Procedure: Lower Extremity Intervention;  Surgeon: Algernon Huxley, MD;  Location: [place] [facility] LAB;  Service: Cardiovascular;  Laterality: Right;  . PERIPHERAL VASCULAR CATHETERIZATION Right 12/03/2015   Procedure: Lower Extremity Angiography;  Surgeon: Algernon Huxley, MD;  Location: [place] [facility] LAB;  Service: Cardiovascular;  Laterality: Right;  . PERIPHERAL VASCULAR CATHETERIZATION Right 12/04/2015   Procedure: Lower Extremity Angiography;  Surgeon: Algernon Huxley, MD;  Location: [place] CV LAB;  Service: Cardiovascular;  Laterality: Right;  . PERIPHERAL VASCULAR CATHETERIZATION  12/04/2015   Procedure: Lower Extremity Intervention;  Surgeon: Algernon Huxley, MD;  Location: [place] CV LAB;  Service: Cardiovascular;;  . stent placement in right leg Right     No family history on file.  Social History Social History  Substance Use Topics  . Smoking status: Former Smoker    Packs/day: 1.50    Types: Cigarettes    Quit date: 12/20/2014  . Smokeless tobacco: Former Systems developer  . Alcohol use No    No Known Allergies  Current Outpatient Prescriptions  Medication Sig Dispense Refill  . aspirin 81 MG tablet Take 81 mg by mouth daily.    . hydrochlorothiazide (MICROZIDE) 12.5 MG capsule Take 12.5 mg by mouth daily.    Marland Kitchen ibuprofen (ADVIL,MOTRIN) 800 MG tablet Take 1 tablet (800 mg total) by mouth every 8 (eight) hours as needed for moderate pain. 15 tablet 0  .  lisinopril (PRINIVIL,ZESTRIL) 10 MG tablet Take 10 mg by mouth daily.    . simvastatin (ZOCOR) 20 MG tablet Take 20 mg by mouth daily.    Marland Kitchen warfarin (COUMADIN) 5 MG tablet Take 1 tablet (5 mg total) by mouth daily at 6 PM. (Patient taking differently: Take 5 mg by mouth daily at 6 PM. 7 mg every other day and then 8 mg every other day) 30 tablet 3   No  current facility-administered medications for this visit.     Review of Systems Review of Systems  Constitutional: Negative.   Respiratory: Negative.   Cardiovascular: Negative.     Blood pressure (!) 122/58, pulse 74, resp. rate 14, height 6' (1.829 m), weight 212 lb (96.2 kg).  Physical Exam Physical Exam  Constitutional: He appears well-developed and well-nourished.  Eyes: Conjunctivae are normal. No scleral icterus.  Neck: Neck supple.  Cardiovascular: Normal rate, regular rhythm and normal heart sounds.   Pulses:      Carotid pulses are 2+ on the right side, and 2+ on the left side.      Femoral pulses are 2+ on the right side with bruit, and 2+ on the left side.      Dorsalis pedis pulses are 0 on the right side, and 2+ on the left side.       Posterior tibial pulses are 0 on the right side, and 2+ on the left side.  Feet are warm, cap refill is brisk. Mild dependent rubor noted in right foot.  Pulmonary/Chest: Effort normal and breath sounds normal.  Lymphadenopathy:    He has no cervical adenopathy.    Data Reviewed Progress notes  Assessment    Peripheral arterial disease, symptomatic     Plan It appears that the patient's arterial occlusive disease is severe. Had a long discussion with pt. Advised patient that new arterial thrombosis or worsening stenosis may be expected with this disease. Encouraged close follow-up with Vascular surgery. Explained that bypass may be the next step if his current stents are failing. Some leg symptoms may be originating from his back, but this is difficult to differentiate at times    This information has been scribed by Gaspar Cola CMA.    Cathlene Gardella G 06/17/2016, 2:20 PM

## 2016-06-17 NOTE — Patient Instructions (Signed)
The patient is aware to call back for any questions or concerns.  

## 2016-06-18 ENCOUNTER — Encounter (INDEPENDENT_AMBULATORY_CARE_PROVIDER_SITE_OTHER): Payer: Self-pay

## 2016-06-18 ENCOUNTER — Ambulatory Visit (INDEPENDENT_AMBULATORY_CARE_PROVIDER_SITE_OTHER): Payer: BLUE CROSS/BLUE SHIELD | Admitting: Vascular Surgery

## 2016-06-18 ENCOUNTER — Other Ambulatory Visit (INDEPENDENT_AMBULATORY_CARE_PROVIDER_SITE_OTHER): Payer: Self-pay | Admitting: Vascular Surgery

## 2016-06-18 ENCOUNTER — Encounter (INDEPENDENT_AMBULATORY_CARE_PROVIDER_SITE_OTHER): Payer: Self-pay | Admitting: Vascular Surgery

## 2016-06-18 VITALS — BP 109/65 | HR 62 | Resp 17 | Ht 72.0 in | Wt 211.0 lb

## 2016-06-18 DIAGNOSIS — E785 Hyperlipidemia, unspecified: Secondary | ICD-10-CM | POA: Insufficient documentation

## 2016-06-18 DIAGNOSIS — I10 Essential (primary) hypertension: Secondary | ICD-10-CM | POA: Diagnosis not present

## 2016-06-18 DIAGNOSIS — I70221 Atherosclerosis of native arteries of extremities with rest pain, right leg: Secondary | ICD-10-CM

## 2016-06-18 NOTE — Progress Notes (Signed)
Subjective:    Patient ID: Joshua [name], male    DOB: 17-May-1956, 60 y.o.   MRN: [id number] Chief Complaint  Patient presents with  . Re-evaluation    Right leg crampping   Patient presents with a month of worsening right lower extremity claudication. Primarily located in the right calf. He does have a history of right lower extremity atherosclerotic / thrombotic disease with multiple endovascular interventions. He presents today with the same "feeling" he has felt in the past when his leg was "clogged". His discomfort is worsening now interfering with his ability to function on a daily basis. 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 He is also starting to experience pain at night. Denies any ulcer formation.     Review of Systems  Constitutional: Negative.   HENT: Negative.   Eyes: Negative.   Respiratory: Negative.   Cardiovascular:       Right Lower Extremity Pain  Gastrointestinal: Negative.   Endocrine: Negative.   Genitourinary: Negative.   Musculoskeletal: Negative.   Skin: Negative.   Allergic/Immunologic: Negative.   Neurological: Negative.   Hematological: Negative.   Psychiatric/Behavioral: Negative.        Objective:   Physical Exam  Constitutional: He is oriented to person, place, and time. He appears well-developed and well-nourished.  HENT:  Head: Normocephalic and atraumatic.  Right Ear: External ear normal.  Eyes: Conjunctivae and EOM are normal. Pupils are equal, round, and reactive to light.  Neck: Normal range of motion.  Cardiovascular: Normal rate, regular rhythm, normal heart sounds and intact distal pulses.   Pulses:      Radial pulses are 2+ on the right side, and 2+ on the left side.       Dorsalis pedis pulses are 2+ on the left side.       Posterior tibial pulses are 2+ on the left side.  Right pedal pulses are very faint  Pulmonary/Chest: Effort normal and breath sounds normal.  Abdominal: Soft. Bowel  sounds are normal.  Musculoskeletal: Normal range of motion. He exhibits no edema.  Neurological: He is alert and oriented to person, place, and time.  Skin: Skin is warm and dry.  Psychiatric: He has a normal mood and affect. His behavior is normal. Judgment and thought content normal.   BP 109/65 (BP Location: Right Arm)   Pulse 62   Resp 17   Ht 6' (1.829 m)   Wt 211 lb (95.7 kg)   BMI 28.62 kg/m   Past Medical History:  Diagnosis Date  . Hypertension   . Peripheral vascular disease [facility])    Social History   Social History  . Marital status: Single    Spouse name: N/A  . Number of children: N/A  . Years of education: N/A   Occupational History  . Not on file.   Social History Main Topics  . Smoking status: Former Smoker    Packs/day: 1.50    Types: Cigarettes    Quit date: 12/20/2014  . Smokeless tobacco: Former Systems developer  . Alcohol use No  . Drug use: No  . Sexual activity: Not on file   Other Topics Concern  . Not on file   Social History Narrative  . No narrative on file   Past Surgical History:  Procedure Laterality Date  . BACK SURGERY  2011  . COLONOSCOPY  2013 ?  Marland Kitchen PERIPHERAL VASCULAR CATHETERIZATION Right 01/02/2015   Procedure: Lower Extremity Angiography;  Surgeon: Dolores Lory  Schnier, MD;  Location: [place] [facility];  Service: Cardiovascular;  Laterality: Right;  . PERIPHERAL VASCULAR CATHETERIZATION Right 06/18/2015   Procedure: Lower Extremity Angiography;  Surgeon: Algernon Huxley, MD;  Location: [place] [facility];  Service: Cardiovascular;  Laterality: Right;  . PERIPHERAL VASCULAR CATHETERIZATION  06/18/2015   Procedure: Lower Extremity Intervention;  Surgeon: Algernon Huxley, MD;  Location: [place] [facility] LAB;  Service: Cardiovascular;;  . PERIPHERAL VASCULAR CATHETERIZATION N/A 10/09/2015   Procedure: Abdominal Aortogram w/Lower Extremity;  Surgeon: Katha Cabal, MD;  Location: [place] [facility];  Service: Cardiovascular;  Laterality: N/A;    . PERIPHERAL VASCULAR CATHETERIZATION  10/09/2015   Procedure: Lower Extremity Intervention;  Surgeon: Katha Cabal, MD;  Location: [place] [facility];  Service: Cardiovascular;;  . PERIPHERAL VASCULAR CATHETERIZATION Right 10/10/2015   Procedure: Lower Extremity Angiography;  Surgeon: Algernon Huxley, MD;  Location: [place] [facility];  Service: Cardiovascular;  Laterality: Right;  . PERIPHERAL VASCULAR CATHETERIZATION Right 10/10/2015   Procedure: Lower Extremity Intervention;  Surgeon: Algernon Huxley, MD;  Location: [place] [facility] LAB;  Service: Cardiovascular;  Laterality: Right;  . PERIPHERAL VASCULAR CATHETERIZATION Right 12/03/2015   Procedure: Lower Extremity Angiography;  Surgeon: Algernon Huxley, MD;  Location: [place] [facility] LAB;  Service: Cardiovascular;  Laterality: Right;  . PERIPHERAL VASCULAR CATHETERIZATION Right 12/04/2015   Procedure: Lower Extremity Angiography;  Surgeon: Algernon Huxley, MD;  Location: [place] [facility] LAB;  Service: Cardiovascular;  Laterality: Right;  . PERIPHERAL VASCULAR CATHETERIZATION  12/04/2015   Procedure: Lower Extremity Intervention;  Surgeon: Algernon Huxley, MD;  Location: [place] [facility];  Service: Cardiovascular;;  . stent placement in right leg Right    No family history on file.  No Known Allergies     Assessment & Plan:  Patient presents with a month of worsening right lower extremity claudication. Primarily located in the right calf. He does have a history of right lower extremity atherosclerotic / thrombotic disease with multiple endovascular interventions. He presents today with the same "feeling" he has felt in the past when his leg was "clogged". His discomfort is worsening now interfering with his ability to function on a daily basis. 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 He is also starting to experience pain at night. Denies any ulcer formation.   1. Atherosclerosis of native artery of right lower  extremity with rest pain ([place]) - Worsening Patient wit lifestyle limiting RLE claudication symptoms.  Faint RLE pedal pulses. Hx of right lower extremity atherosclerotic / thrombotic disease with multiple endovascular interventions. Recommend RLE angiogram with intervention to revascularize the extremity. Procedure, risks and benefits. Explained to patient. All questions answered. Patient wishes to proceed.   2. Hyperlipidemia, unspecified hyperlipidemia type - Stable On statin for medical optimization. Encouraged good control as its slows the progression of atherosclerotic disease  3. Essential hypertension - Stable Encouraged good control as its slows the progression of atherosclerotic disease  Current Outpatient Prescriptions on File Prior to Visit  Medication Sig Dispense Refill  . aspirin 81 MG tablet Take 81 mg by mouth daily.    . hydrochlorothiazide (MICROZIDE) 12.5 MG capsule Take 12.5 mg by mouth daily.    Marland Kitchen ibuprofen (ADVIL,MOTRIN) 800 MG tablet Take 1 tablet (800 mg total) by mouth every 8 (eight) hours as needed for moderate pain. 15 tablet 0  . lisinopril (PRINIVIL,ZESTRIL) 10 MG tablet Take 10 mg by mouth daily.    Marland Kitchen  simvastatin (ZOCOR) 20 MG tablet Take 20 mg by mouth daily.    Marland Kitchen warfarin (COUMADIN) 5 MG tablet Take 1 tablet (5 mg total) by mouth daily at 6 PM. (Patient taking differently: Take 5 mg by mouth daily at 6 PM. 7 mg every other day and then 8 mg every other day) 30 tablet 3   No current facility-administered medications on file prior to visit.     There are no Patient Instructions on file for this visit. No Follow-up on file.   Tyvion Edmondson A Hanaan Gancarz, PA-C

## 2016-06-23 ENCOUNTER — Encounter
Admission: RE | Admit: 2016-06-23 | Discharge: 2016-06-23 | Disposition: A | Discharge status: Home / Self Care | Payer: BLUE CROSS/BLUE SHIELD | Source: Ambulatory Visit | Attending: Vascular Surgery | Admitting: Vascular Surgery

## 2016-06-23 DIAGNOSIS — Z7982 Long term (current) use of aspirin: Secondary | ICD-10-CM | POA: Diagnosis not present

## 2016-06-23 DIAGNOSIS — Z7902 Long term (current) use of antithrombotics/antiplatelets: Secondary | ICD-10-CM | POA: Diagnosis not present

## 2016-06-23 DIAGNOSIS — Z87891 Personal history of nicotine dependence: Secondary | ICD-10-CM | POA: Diagnosis not present

## 2016-06-23 DIAGNOSIS — I10 Essential (primary) hypertension: Secondary | ICD-10-CM | POA: Diagnosis not present

## 2016-06-23 DIAGNOSIS — I70223 Atherosclerosis of native arteries of extremities with rest pain, bilateral legs: Secondary | ICD-10-CM | POA: Diagnosis not present

## 2016-06-23 DIAGNOSIS — E785 Hyperlipidemia, unspecified: Secondary | ICD-10-CM | POA: Diagnosis not present

## 2016-06-23 HISTORY — DX: Prediabetes: R73.03

## 2016-06-23 LAB — CREATININE, SERUM
CREATININE: 1.05 mg/dL (ref 0.61–1.24)
GFR calc Af Amer: >60 mL/min (ref >60)

## 2016-06-23 LAB — BUN: BUN: 12 mg/dL (ref 6–20)

## 2016-06-23 NOTE — Patient Instructions (Signed)
[facility]  Your procedure is scheduled with Dr   Delana Meyer                            On:  Date:  06/25/16     WED              Time: 12:00     On arrival [facility] on first floor of the [facility]. Your arrival time will be approximately 1-1.5 hours prior to you procedure start time. This allows time to check lab results and prep you for the procedure.  If your procedure time change you will be notified by the doctor's office.    _x____Do not eat or drink 8 hours prior to your procedure.    __x___Take all your morning medications with sips of water.  _____Continue Plavix and/or Aspirin.  _____Take half of your bedtime Insulin.  _____Take half of  your morning Insulin.  _____Bring your current medications in their bottle with you.  _____Do not take Metformin 24 hours before procedure or 48 hours after you procedure.  _____Stop Pradaxa, Xarelto, Coumadin, Effient, or Eliquis for 3 days prior to your                      procedure.  Your recovery time will be 1-2 hours (1 hour for a  vein stick, 2 hours for an artery stick )  Please call Dr Lucky Cowboy and Dr Nino Parsley office with any questions or concerns: [phone].  You will need to have someone drive you home and stay with you the night of the procedure.

## 2016-06-25 ENCOUNTER — Encounter
Admission: RE | Disposition: A | Discharge status: Home / Self Care | Payer: Self-pay | Source: Ambulatory Visit | Attending: Vascular Surgery

## 2016-06-25 ENCOUNTER — Ambulatory Visit
Admission: RE | Admit: 2016-06-25 | Discharge: 2016-06-25 | Disposition: A | Discharge status: Home / Self Care | Payer: BLUE CROSS/BLUE SHIELD | Source: Ambulatory Visit | Attending: Vascular Surgery | Admitting: Vascular Surgery

## 2016-06-25 ENCOUNTER — Encounter: Payer: Self-pay | Admitting: *Deleted

## 2016-06-25 DIAGNOSIS — I70223 Atherosclerosis of native arteries of extremities with rest pain, bilateral legs: Secondary | ICD-10-CM | POA: Insufficient documentation

## 2016-06-25 DIAGNOSIS — E785 Hyperlipidemia, unspecified: Secondary | ICD-10-CM | POA: Insufficient documentation

## 2016-06-25 DIAGNOSIS — Z7982 Long term (current) use of aspirin: Secondary | ICD-10-CM | POA: Insufficient documentation

## 2016-06-25 DIAGNOSIS — I10 Essential (primary) hypertension: Secondary | ICD-10-CM | POA: Insufficient documentation

## 2016-06-25 DIAGNOSIS — I70211 Atherosclerosis of native arteries of extremities with intermittent claudication, right leg: Secondary | ICD-10-CM | POA: Diagnosis not present

## 2016-06-25 DIAGNOSIS — Z7902 Long term (current) use of antithrombotics/antiplatelets: Secondary | ICD-10-CM | POA: Insufficient documentation

## 2016-06-25 DIAGNOSIS — Z87891 Personal history of nicotine dependence: Secondary | ICD-10-CM | POA: Insufficient documentation

## 2016-06-25 HISTORY — PX: PERIPHERAL VASCULAR CATHETERIZATION: SHX172C

## 2016-06-25 LAB — PROTIME-INR
INR: 1.11
PROTHROMBIN TIME: 14.3 s (ref 11.4–15.2)

## 2016-06-25 SURGERY — LOWER EXTREMITY ANGIOGRAPHY
Anesthesia: Moderate Sedation | Site: Leg Lower | Laterality: Right

## 2016-06-25 MED ORDER — APIXABAN 5 MG PO TABS
5.0000 mg | ORAL_TABLET | Freq: Two times a day (BID) | ORAL | Status: DC
  Filled 2016-06-25 (×3): qty 1

## 2016-06-25 MED ORDER — OXYCODONE HCL 5 MG PO TABS
5.0000 mg | ORAL_TABLET | ORAL | Status: DC | PRN
  Filled 2016-06-25: qty 2

## 2016-06-25 MED ORDER — ACETAMINOPHEN 325 MG PO TABS: 325.0000 mg | ORAL_TABLET | ORAL | Status: DC | PRN

## 2016-06-25 MED ORDER — ALUM & MAG HYDROXIDE-SIMETH 200-200-20 MG/5ML PO SUSP: 15.0000 mL | ORAL | Status: DC | PRN

## 2016-06-25 MED ORDER — LABETALOL HCL 5 MG/ML IV SOLN: 10.0000 mg | INTRAVENOUS | Status: DC | PRN

## 2016-06-25 MED ORDER — PANTOPRAZOLE SODIUM 40 MG PO TBEC: 40.0000 mg | DELAYED_RELEASE_TABLET | Freq: Every day | ORAL | Status: DC

## 2016-06-25 MED ORDER — HEPARIN SODIUM (PORCINE) 1000 UNIT/ML IJ SOLN
INTRAMUSCULAR | Status: DC | PRN
  Administered 2016-06-25: 2000 [IU] via INTRAVENOUS
  Administered 2016-06-25: 5000 [IU] via INTRAVENOUS

## 2016-06-25 MED ORDER — APIXABAN 5 MG PO TABS
10.0000 mg | ORAL_TABLET | Freq: Once | ORAL | Status: AC
  Administered 2016-06-25: 5 mg via ORAL
  Filled 2016-06-25: qty 2

## 2016-06-25 MED ORDER — METOPROLOL TARTRATE 5 MG/5ML IV SOLN: 5.0000 mg | Freq: Four times a day (QID) | INTRAVENOUS | Status: DC

## 2016-06-25 MED ORDER — HYDRALAZINE HCL 20 MG/ML IJ SOLN: 5.0000 mg | INTRAMUSCULAR | Status: DC | PRN

## 2016-06-25 MED ORDER — DEXTROSE 5 % IV SOLN: 1.5000 g | INTRAVENOUS | Status: DC

## 2016-06-25 MED ORDER — MORPHINE SULFATE (PF) 4 MG/ML IV SOLN: 2.0000 mg | INTRAVENOUS | Status: DC | PRN

## 2016-06-25 MED ORDER — HEPARIN SODIUM (PORCINE) 1000 UNIT/ML IJ SOLN
INTRAMUSCULAR | Status: AC
  Filled 2016-06-25: qty 1

## 2016-06-25 MED ORDER — MIDAZOLAM HCL 5 MG/5ML IJ SOLN
INTRAMUSCULAR | Status: AC
  Filled 2016-06-25: qty 5

## 2016-06-25 MED ORDER — DOCUSATE SODIUM 100 MG PO CAPS: 100.0000 mg | ORAL_CAPSULE | Freq: Every day | ORAL | Status: DC

## 2016-06-25 MED ORDER — ALTEPLASE 2 MG IJ SOLR
INTRAMUSCULAR | Status: AC
Start: 2016-06-25 — End: 2016-06-25
  Filled 2016-06-25: qty 8

## 2016-06-25 MED ORDER — MIDAZOLAM HCL 2 MG/2ML IJ SOLN
INTRAMUSCULAR | Status: AC
  Filled 2016-06-25: qty 2

## 2016-06-25 MED ORDER — HEPARIN (PORCINE) IN NACL 2-0.9 UNIT/ML-% IJ SOLN
INTRAMUSCULAR | Status: AC
  Filled 2016-06-25: qty 1000

## 2016-06-25 MED ORDER — HYDROMORPHONE HCL 1 MG/ML IJ SOLN: 1.0000 mg | Freq: Once | INTRAMUSCULAR | Status: DC

## 2016-06-25 MED ORDER — FENTANYL CITRATE (PF) 100 MCG/2ML IJ SOLN
INTRAMUSCULAR | Status: DC | PRN
  Administered 2016-06-25 (×4): 50 ug via INTRAVENOUS

## 2016-06-25 MED ORDER — LIDOCAINE HCL (PF) 1 % IJ SOLN
INTRAMUSCULAR | Status: AC
  Filled 2016-06-25: qty 30

## 2016-06-25 MED ORDER — FENTANYL CITRATE (PF) 100 MCG/2ML IJ SOLN
INTRAMUSCULAR | Status: AC
  Filled 2016-06-25: qty 2

## 2016-06-25 MED ORDER — FAMOTIDINE 20 MG PO TABS: 40.0000 mg | ORAL_TABLET | ORAL | Status: DC | PRN

## 2016-06-25 MED ORDER — ALTEPLASE 2 MG IJ SOLR
INTRAMUSCULAR | Status: DC | PRN
  Administered 2016-06-25: 8 mg

## 2016-06-25 MED ORDER — METHYLPREDNISOLONE SODIUM SUCC 125 MG IJ SOLR: 125.0000 mg | INTRAMUSCULAR | Status: DC | PRN

## 2016-06-25 MED ORDER — ACETAMINOPHEN 325 MG RE SUPP
325.0000 mg | RECTAL | Status: DC | PRN
  Filled 2016-06-25: qty 2

## 2016-06-25 MED ORDER — ONDANSETRON HCL 4 MG/2ML IJ SOLN: 4.0000 mg | Freq: Four times a day (QID) | INTRAMUSCULAR | Status: DC | PRN

## 2016-06-25 MED ORDER — SODIUM CHLORIDE 0.9 % IV SOLN: INTRAVENOUS | Status: DC

## 2016-06-25 MED ORDER — ONDANSETRON HCL 4 MG/2ML IJ SOLN
4.0000 mg | Freq: Four times a day (QID) | INTRAMUSCULAR | Status: DC | PRN
Start: 2016-06-25 — End: 2016-06-25

## 2016-06-25 MED ORDER — MIDAZOLAM HCL 2 MG/2ML IJ SOLN
INTRAMUSCULAR | Status: DC | PRN
  Administered 2016-06-25 (×2): 1 mg via INTRAVENOUS
  Administered 2016-06-25 (×2): 2 mg via INTRAVENOUS

## 2016-06-25 SURGICAL SUPPLY — 30 items
BALLN DORADO 6X200X135 (BALLOONS) ×2
BALLN LUTONIX DCB 6X60X130 (BALLOONS) ×2
BALLN LUTONIX DCB 7X40X130 (BALLOONS) ×2
BALLN ULTRVRSE 3X40X130C (BALLOONS) ×2
BALLOON DORADO 6X200X135 (BALLOONS) ×1 IMPLANT
BALLOON LUTONIX DCB 6X60X130 (BALLOONS) ×1 IMPLANT
BALLOON LUTONIX DCB 7X40X130 (BALLOONS) ×1 IMPLANT
BALLOON ULTRVRSE 3X40X130C (BALLOONS) ×1 IMPLANT
CATH BEACON 5.038 65CM KMP-01 (CATHETERS) ×2 IMPLANT
CATH PIG 70CM (CATHETERS) ×2 IMPLANT
DEVICE PRESTO INFLATION (MISCELLANEOUS) ×2 IMPLANT
DEVICE SOLENT OMNI 120CM (CATHETERS) ×2 IMPLANT
DEVICE STARCLOSE SE CLOSURE (Vascular Products) ×2 IMPLANT
DEVICE TORQUE (MISCELLANEOUS) ×2 IMPLANT
GLIDECATH ANGLED 4FR 120CM (CATHETERS) ×2 IMPLANT
GLIDEWIRE ANGLED SS 035X260CM (WIRE) ×2 IMPLANT
LIFESTENT 6X40X130 (Permanent Stent) ×2 IMPLANT
PACK ANGIOGRAPHY (CUSTOM PROCEDURE TRAY) ×2 IMPLANT
SET INTRO CAPELLA COAXIAL (SET/KITS/TRAYS/PACK) ×2 IMPLANT
SHEATH ANL2 6FRX45 HC (SHEATH) ×2 IMPLANT
SHEATH BRITE TIP 5FRX11 (SHEATH) ×2 IMPLANT
SHEATH RAABE 7FR (SHEATH) ×2 IMPLANT
SHIELD RADPAD SCOOP 12X17 (MISCELLANEOUS) ×2 IMPLANT
STENT VIABAHN 7X25X120 (Permanent Stent) ×1 IMPLANT
STENT VIABAHN2.5X120X7X (Permanent Stent) ×1 IMPLANT
SYR MEDRAD MARK V 150ML (SYRINGE) ×2 IMPLANT
TUBING CONTRAST HIGH PRESS 72 (TUBING) ×2 IMPLANT
WIRE G V18X300CM (WIRE) ×2 IMPLANT
WIRE HI TORQ VERSACORE 300 (WIRE) ×2 IMPLANT
WIRE J 3MM .035X145CM (WIRE) ×2 IMPLANT

## 2016-06-25 NOTE — Op Note (Signed)
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 Introduction catheter into right lower extremity 3rd order catheter placement  2. Contrast injection right lower extremity for distal runoff  3. Percutaneous transluminal angioplasty and stent placement right superficial femoral and popliteal arteries 4. Percutaneous transluminal angioplasty right peroneal  5. Infusion of 8 mg of TPA right SFA and popliteal             6.  Mechanical thrombectomy right SFA and popliteal using the Omni AngioJet             7.  Star close closure left common femoral arteriotomy  Anesthesia: Conscious sedation was administered under my direct supervision. IV Versed plus fentanyl were utilized. Continuous ECG, pulse oximetry and blood pressure was monitored throughout the entire procedure. Conscious sedation was  for a total of 2 hours 40 minutes.  Sheath: 7 [place] Rabi retrograde left common femoral  Contrast: 135 cc  Fluoroscopy Time: 13.9 minutes  Indications: Joshua [name] presents with increasing pain of the right lower extremity. He is currently working and is no longer able to carry on the activities at work and is concerned that he will lose his job. This suggests the patient is having lifestyle limiting vascular issues. The risks and benefits are reviewed all questions answered patient agrees to proceed.  Procedure:Joshua [name] is a 60 y.o. y.o. male who was identified and appropriate procedural time out was performed. The patient was then placed supine on the table and prepped and draped in the usual sterile fashion.   Ultrasound was placed in the  sterile sleeve and the left groin was evaluated the left common femoral artery was echolucent and pulsatile indicating patency. Image was recorded for the permanent record and under real-time visualization a microneedle was inserted into the common femoral artery microwire followed by a micro-sheath. A J-wire was then advanced through the micro-sheath and a 5 [place] sheath was then inserted over a J-wire. J-wire was then advanced and a 5 French pigtail catheter was positioned at the level of T12.  AP projection of the aorta was then obtained. Pigtail catheter was repositioned to above the bifurcation and a LAO view of the pelvis was obtained. Subsequently a pigtail catheter with the stiff angle Glidewire was used to cross the aortic bifurcation the catheter wire were advanced down into the right distal external iliac artery. Oblique view of the femoral bifurcation was then obtained and subsequently the wire was reintroduced and the pigtail catheter negotiated into the SFA representing third order catheter placement. Distal runoff was then performed.  5000 units of heparin was then given and allowed to circulate and a 6 [place] Ansell sheath was advanced up and over the bifurcation and positioned in the proximal common femoral artery  Straight catheter and stiff angle Glidewire were then negotiated down into the distal popliteal. Catheter was then advanced. Hand injection contrast demonstrated intraluminal placement and the tibial anatomy in detail.  At this point the AngioJet was opened onto the field and prepped 8 mg of TPA was reconstituted in 50 cc and the TPA was then laced from the distal edge of the stent in the popliteal all the way back to the leading edge of the stent in the proximal SFA. The TPA was then allowed to dwell for 40 minutes. Hand  injection contrast after the dwell time continued to show significant obstruction and the Omni catheter was then used to perform mechanical thrombectomy  up-and-down the SFA and popliteal twice. Follow-up imaging now demonstrated flow was quite sluggish throughout.  A 6 x 20 [place] balloon was then used to angioplasty the entire length of the previously stented segment beginning in the mid popliteal and angioplasty back through the origin of the SFA. Inflations were for 1 minute to 20-30 atmospheres. Follow-up imaging now demonstrated improved flow with what appears to be a limiting lesion at the origin of the SFA.  Magnified steep RAO oblique was then obtained and a 7 x 40 Lutonix balloon was used to angioplasty the origin the SFA. Each inflation was for 2 minutes at 12 atm. Follow-up imaging demonstrated poor patency.  I therefore elected to place a stent extending slightly into the common femoral by 1-2 mm. I selected a 7 x 2.5 Viabahn. Therefore, the sheath was upsized to a 7 [place] Rabi over the 035 wire straight catheter was then advanced over the wire down into the tibials and the 035 wire exchanged for V 18. The Viabahn stent was then positioned across the lesion and deployed and postdilated with the 7 mm Lutonix balloon. This was the previously used Lutonix balloon. Stent profile quite nicely. The detector was repositioned and distal runoff was performed which now demonstrated wide patency and the proximal portion and throughout the stented segment until the distal margin where in the mid popliteal there was a persistent flow limiting lesion. This was initially angioplasty with a 6 mm Lutonix balloon however this did not adequately treat this area and therefore a 6 x 40 life stent was deployed across this and postdilated with a 6 mm balloon. This provided an excellent result with near 0 residual stenosis. Distal runoff was then reassessed  The distal tibioperoneal trunk and proximal peroneal demonstrated a near occlusive lesion and this was treated with a 3 mm balloon inflated to 20 atm for 2 minutes. Follow-up imaging demonstrated wide patency with  filling of both peroneal and posterior tibial Distal runoff was then reassessed and noted to be widely patent.   After review of these images the sheath is pulled into the left external iliac oblique of the common femoral is obtained and a Star close device deployed. There no immediate Complications.  Findings: The abdominal aorta is opacified with a bolus injection contrast. Renal arteries are widely patent. The aorta itself has diffuse disease but no hemodynamically significant lesions. The common and external iliac arteries are widely patent bilaterally.  The right common femoral is widely patent as is the profunda femoris.  The SFA does indeed have an occlusion throughout the entire length into the proximal half of the popliteal. This essentially is the entire previously stented segment.  The distal popliteal demonstrates mild disease and the trifurcation is  diseased with occlusion of the anterior tibial and a total occlusion of the distal tibioperoneal trunk and proximal peroneal peroneal.  The posterior tibial is patent from its origin down to the foot.  Once the lesion was crossed TPA was infused allowed to dwell subsequent mechanical thrombectomy was performed and then angioplasty of the entire SFA and popliteal. 2 relatively focal areas of residual high-grade stenosis are noted 1 proximally 1 distally and these are treated with angioplasty and then subsequent stent placement. The lesion and the tibioperoneal trunk and proximal peroneal is treated with a single balloon inflation to 3 mm.    Summary:  Successful recanalization right lower extremity for limb salvage and improved lifestyle   Disposition: Patient was taken to the recovery room in stable condition having tolerated the procedure well.  Joshua [name] 06/25/2016,2:32 PM

## 2016-06-25 NOTE — H&P (Signed)
[facility] History & Physical Update  The patient was interviewed and re-examined.  The patient's previous History and Physical has been reviewed and is unchanged.  There is no change in the plan of care. We plan to proceed with the scheduled procedure.  [name], MD  06/25/2016, 9:25 AM

## 2016-06-25 NOTE — Discharge Instructions (Signed)
Angiogram, Care After °These instructions give you information about caring for yourself after your procedure. Your doctor may also give you more specific instructions. Call your doctor if you have any problems or questions after your procedure. °Follow these instructions at home: °· Take medicines only as told by your doctor. °· Follow your doctor's instructions about: °¨ Care of the area where the tube was inserted. °¨ Bandage (dressing) changes and removal. °· You may shower 24-48 hours after the procedure or as told by your doctor. °· Do not take baths, swim, or use a hot tub until your doctor approves. °· Every day, check the area where the tube was inserted. Watch for: °¨ Redness, swelling, or pain. °¨ Fluid, blood, or pus. °· Do not apply powder or lotion to the site. °· Do not lift anything that is heavier than 10 lb (4.5 kg) for 5 days or as told by your doctor. °· Ask your doctor when you can: °¨ Return to work or school. °¨ Do physical activities or play sports. °¨ Have sex. °· Do not drive or operate heavy machinery for 24 hours or as told by your doctor. °· Have someone with you for the first 24 hours after the procedure. °· Keep all follow-up visits as told by your doctor. This is important. °Contact a health care provider if: °· You have a fever. °· You have chills. °· You have more bleeding from the area where the tube was inserted. Hold pressure on the area. °· You have redness, swelling, or pain in the area where the tube was inserted. °· You have fluid or pus coming from the area. °Get help right away if: °· You have a lot of pain in the area where the tube was inserted. °· The area where the tube was inserted is bleeding, and the bleeding does not stop after 30 minutes of holding steady pressure on the area. °· The area near or just beyond the insertion site becomes pale, cool, tingly, or numb. °This information is not intended to replace advice given to you by your health care provider. Make  sure you discuss any questions you have with your health care provider. °Document Released: 10/03/2008 Document Revised: 12/13/2015 Document Reviewed: 12/08/2012 °Elsevier Interactive Patient Education © 2017 [employer] ° °

## 2016-06-26 ENCOUNTER — Telehealth (INDEPENDENT_AMBULATORY_CARE_PROVIDER_SITE_OTHER): Payer: Self-pay

## 2016-06-26 ENCOUNTER — Other Ambulatory Visit (INDEPENDENT_AMBULATORY_CARE_PROVIDER_SITE_OTHER): Payer: Self-pay | Admitting: Vascular Surgery

## 2016-06-26 DIAGNOSIS — I70201 Unspecified atherosclerosis of native arteries of extremities, right leg: Secondary | ICD-10-CM

## 2016-06-26 DIAGNOSIS — M79604 Pain in right leg: Secondary | ICD-10-CM

## 2016-06-26 NOTE — Telephone Encounter (Signed)
Patient called about having a cold foot(right) with numbness.Patient had an procedure done yesterday,we will be bringing patient in tomorrow for an ultrasound and see JD.

## 2016-06-27 ENCOUNTER — Ambulatory Visit (INDEPENDENT_AMBULATORY_CARE_PROVIDER_SITE_OTHER): Payer: BLUE CROSS/BLUE SHIELD | Admitting: Vascular Surgery

## 2016-06-27 ENCOUNTER — Encounter (INDEPENDENT_AMBULATORY_CARE_PROVIDER_SITE_OTHER): Payer: Self-pay

## 2016-06-27 ENCOUNTER — Ambulatory Visit (INDEPENDENT_AMBULATORY_CARE_PROVIDER_SITE_OTHER): Payer: BLUE CROSS/BLUE SHIELD

## 2016-06-27 ENCOUNTER — Encounter (INDEPENDENT_AMBULATORY_CARE_PROVIDER_SITE_OTHER): Payer: Self-pay | Admitting: Vascular Surgery

## 2016-06-27 ENCOUNTER — Other Ambulatory Visit
Admission: RE | Admit: 2016-06-27 | Discharge: 2016-06-27 | Disposition: A | Discharge status: Home / Self Care | Payer: BLUE CROSS/BLUE SHIELD | Source: Ambulatory Visit | Attending: Vascular Surgery | Admitting: Vascular Surgery

## 2016-06-27 VITALS — BP 109/66 | HR 76 | Resp 16 | Ht 72.0 in

## 2016-06-27 DIAGNOSIS — M79604 Pain in right leg: Secondary | ICD-10-CM

## 2016-06-27 DIAGNOSIS — I70201 Unspecified atherosclerosis of native arteries of extremities, right leg: Secondary | ICD-10-CM

## 2016-06-27 DIAGNOSIS — I70221 Atherosclerosis of native arteries of extremities with rest pain, right leg: Secondary | ICD-10-CM | POA: Diagnosis not present

## 2016-06-27 DIAGNOSIS — I743 Embolism and thrombosis of arteries of the lower extremities: Secondary | ICD-10-CM

## 2016-06-27 DIAGNOSIS — I10 Essential (primary) hypertension: Secondary | ICD-10-CM

## 2016-06-27 DIAGNOSIS — E785 Hyperlipidemia, unspecified: Secondary | ICD-10-CM

## 2016-06-27 NOTE — Patient Instructions (Signed)
Peripheral Vascular Disease Peripheral vascular disease (PVD) is a disease of the blood vessels that are not part of your heart and brain. A simple term for PVD is poor circulation. In most cases, PVD narrows the blood vessels that carry blood from your heart to the rest of your body. This can result in a decreased supply of blood to your arms, legs, and internal organs, like your stomach or kidneys. However, it most often affects a person's lower legs and feet. There are two types of PVD.  Organic PVD. This is the more common type. It is caused by damage to the structure of blood vessels.  Functional PVD. This is caused by conditions that make blood vessels contract and tighten (spasm).  Without treatment, PVD tends to get worse over time. PVD can also lead to acute ischemic limb. This is when an arm or limb suddenly has trouble getting enough blood. This is a medical emergency. What are the causes? Each type of PVD has many different causes. The most common cause of PVD is buildup of a fatty material (plaque) inside of your arteries (atherosclerosis). Small amounts of plaque can break off from the walls of the blood vessels and become lodged in a smaller artery. This blocks blood flow and can cause acute ischemic limb. Other common causes of PVD include:  Blood clots that form inside of blood vessels.  Injuries to blood vessels.  Diseases that cause inflammation of blood vessels or cause blood vessel spasms.  Health behaviors and health history that increase your risk of developing PVD.  What increases the risk? You may have a greater risk of PVD if you:  Have a family history of PVD.  Have certain medical conditions, including: ? High cholesterol. ? Diabetes. ? High blood pressure (hypertension). ? Coronary heart disease. ? Past problems with blood clots. ? Past injury, such as burns or a broken bone. These may have damaged blood vessels in your limbs. ? Buerger disease. This is  caused by inflamed blood vessels in your hands and feet. ? Some forms of arthritis. ? Rare birth defects that affect the arteries in your legs.  Use tobacco.  Do not get enough exercise.  Are obese.  Are age 50 or older.  What are the signs or symptoms? PVD may cause many different symptoms. Your symptoms depend on what part of your body is not getting enough blood. Some common signs and symptoms include:  Cramps in your lower legs. This may be a symptom of poor leg circulation (claudication).  Pain and weakness in your legs while you are physically active that goes away when you rest (intermittent claudication).  Leg pain when at rest.  Leg numbness, tingling, or weakness.  Coldness in a leg or foot, especially when compared with the other leg.  Skin or hair changes. These can include: ? Hair loss. ? Shiny skin. ? Pale or bluish skin. ? Thick toenails.  Inability to get or maintain an erection (erectile dysfunction).  People with PVD are more prone to developing ulcers and sores on their toes, feet, or legs. These may take longer than normal to heal. How is this diagnosed? Your health care provider may diagnose PVD from your signs and symptoms. The health care provider will also do a physical exam. You may have tests to find out what is causing your PVD and determine its severity. Tests may include:  Blood pressure recordings from your arms and legs and measurements of the strength of your pulses (  pulse volume recordings).  Imaging studies using sound waves to take pictures of the blood flow through your blood vessels (Doppler ultrasound).  Injecting a dye into your blood vessels before having imaging studies using: ? X-rays (angiogram or arteriogram). ? Computer-generated X-rays (CT angiogram). ? A powerful electromagnetic field and a computer (magnetic resonance angiogram or MRA).  How is this treated? Treatment for PVD depends on the cause of your condition and the  severity of your symptoms. It also depends on your age. Underlying causes need to be treated and controlled. These include long-lasting (chronic) conditions, such as diabetes, high cholesterol, and high blood pressure. You may need to first try making lifestyle changes and taking medicines. Surgery may be needed if these do not work. Lifestyle changes may include:  Quitting smoking.  Exercising regularly.  Following a low-fat, low-cholesterol diet.  Medicines may include:  Blood thinners to prevent blood clots.  Medicines to improve blood flow.  Medicines to improve your blood cholesterol levels.  Surgical procedures may include:  A procedure that uses an inflated balloon to open a blocked artery and improve blood flow (angioplasty).  A procedure to put in a tube (stent) to keep a blocked artery open (stent implant).  Surgery to reroute blood flow around a blocked artery (peripheral bypass surgery).  Surgery to remove dead tissue from an infected wound on the affected limb.  Amputation. This is surgical removal of the affected limb. This may be necessary in cases of acute ischemic limb that are not improved through medical or surgical treatments.  Follow these instructions at home:  Take medicines only as directed by your health care provider.  Do not use any tobacco products, including cigarettes, chewing tobacco, or electronic cigarettes. If you need help quitting, ask your health care provider.  Lose weight if you are overweight, and maintain a healthy weight as directed by your health care provider.  Eat a diet that is low in fat and cholesterol. If you need help, ask your health care provider.  Exercise regularly. Ask your health care provider to suggest some good activities for you.  Use compression stockings or other mechanical devices as directed by your health care provider.  Take good care of your feet. ? Wear comfortable shoes that fit well. ? Check your feet  often for any cuts or sores. Contact a health care provider if:  You have cramps in your legs while walking.  You have leg pain when you are at rest.  You have coldness in a leg or foot.  Your skin changes.  You have erectile dysfunction.  You have cuts or sores on your feet that are not healing. Get help right away if:  Your arm or leg turns cold and blue.  Your arms or legs become red, warm, swollen, painful, or numb.  You have chest pain or trouble breathing.  You suddenly have weakness in your face, arm, or leg.  You become very confused or lose the ability to speak.  You suddenly have a very bad headache or lose your vision. This information is not intended to replace advice given to you by your health care provider. Make sure you discuss any questions you have with your health care provider. Document Released: 08/14/2004 Document Revised: 12/13/2015 Document Reviewed: 12/15/2013 Elsevier Interactive Patient Education  2017 [employer] Inc.  

## 2016-06-27 NOTE — Assessment & Plan Note (Signed)
blood pressure control important in reducing the progression of atherosclerotic disease. On appropriate oral medications.  

## 2016-06-27 NOTE — Progress Notes (Signed)
MRN : [id number]  Joshua [name] is a 60 y.o. (June 05, 1956) male who presents with chief complaint of  Chief Complaint  Patient presents with  . Follow-up  .  History of Present Illness: Patient returns today in follow up of His peripheral arterial disease. He has had very difficult to maintain right lower extremity perfusion. He had an angiogram with extensive intervention earlier this week, and presents with a 24-hour history of numbness and pain in his right foot. His right foot is somewhat pale but not mottled. He is neurologically intact in terms of normal motor function and gross sensation, but his pinpoint sensation may be reduced a little bit. This is stable from last night. He has started his anticoagulation appropriately. 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 His profunda femoris artery appears to be patent. His runoff appears to be through the tibioperoneal trunk vessels. He does not have ulceration or infection. He has no fever or chills.  Current Outpatient Prescriptions  Medication Sig Dispense Refill  . aspirin 81 MG tablet Take 81 mg by mouth daily.    Marland Kitchen ELIQUIS 5 MG TABS tablet TK 1 T PO BID  6  . hydrochlorothiazide (MICROZIDE) 12.5 MG capsule Take 12.5 mg by mouth daily.    Marland Kitchen lisinopril (PRINIVIL,ZESTRIL) 10 MG tablet Take 10 mg by mouth daily.    . simvastatin (ZOCOR) 20 MG tablet Take 20 mg by mouth daily at 6 PM.      No current facility-administered medications for this visit.     Past Medical History:  Diagnosis Date  . Hypertension   . Peripheral vascular disease ([place])   . Pre-diabetes     Past Surgical History:  Procedure Laterality Date  . BACK SURGERY  2011  . COLONOSCOPY  2013 ?  Marland Kitchen PERIPHERAL VASCULAR CATHETERIZATION Right 01/02/2015   Procedure: Lower Extremity Angiography;  Surgeon: Katha Cabal, MD;  Location: [place] [facility];  Service: Cardiovascular;   Laterality: Right;  . PERIPHERAL VASCULAR CATHETERIZATION Right 06/18/2015   Procedure: Lower Extremity Angiography;  Surgeon: Algernon Huxley, MD;  Location: [place] [facility];  Service: Cardiovascular;  Laterality: Right;  . PERIPHERAL VASCULAR CATHETERIZATION  06/18/2015   Procedure: Lower Extremity Intervention;  Surgeon: Algernon Huxley, MD;  Location: [place] [facility];  Service: Cardiovascular;;  . PERIPHERAL VASCULAR CATHETERIZATION N/A 10/09/2015   Procedure: Abdominal Aortogram w/Lower Extremity;  Surgeon: Katha Cabal, MD;  Location: [place] [facility];  Service: Cardiovascular;  Laterality: N/A;  . PERIPHERAL VASCULAR CATHETERIZATION  10/09/2015   Procedure: Lower Extremity Intervention;  Surgeon: Katha Cabal, MD;  Location: [place] [facility];  Service: Cardiovascular;;  . PERIPHERAL VASCULAR CATHETERIZATION Right 10/10/2015   Procedure: Lower Extremity Angiography;  Surgeon: Algernon Huxley, MD;  Location: [place] [facility];  Service: Cardiovascular;  Laterality: Right;  . PERIPHERAL VASCULAR CATHETERIZATION Right 10/10/2015   Procedure: Lower Extremity Intervention;  Surgeon: Algernon Huxley, MD;  Location: [place] [facility];  Service: Cardiovascular;  Laterality: Right;  . PERIPHERAL VASCULAR CATHETERIZATION Right 12/03/2015   Procedure: Lower Extremity Angiography;  Surgeon: Algernon Huxley, MD;  Location: [place] [facility];  Service: Cardiovascular;  Laterality: Right;  . PERIPHERAL VASCULAR CATHETERIZATION Right 12/04/2015   Procedure: Lower Extremity Angiography;  Surgeon: Algernon Huxley, MD;  Location: [place] [facility];  Service: Cardiovascular;  Laterality: Right;  . PERIPHERAL VASCULAR CATHETERIZATION  12/04/2015  Procedure: Lower Extremity Intervention;  Surgeon: Algernon Huxley, MD;  Location: [place] [facility];  Service: Cardiovascular;;  . stent placement in right leg Right     Social History Social History  Substance Use Topics  . Smoking status: Former Smoker     Packs/day: 1.50    Types: Cigarettes    Quit date: 12/20/2014  . Smokeless tobacco: Former Systems developer  . Alcohol use No  No IV drug use.  Family History No known history of bleeding disorders, clotting disorders, autoimmune diseases, or aneurysms  No Known Allergies   REVIEW OF SYSTEMS (Negative unless checked)  Constitutional: '[]'$ Weight loss  '[]'$ Fever  '[]'$ Chills Cardiac: '[]'$ Chest pain   '[]'$ Chest pressure   '[]'$ Palpitations   '[]'$ Shortness of breath when laying flat   '[]'$ Shortness of breath at rest   '[]'$ Shortness of breath with exertion. Vascular:  '[]'$ Pain in legs with walking   '[]'$ Pain in legs at rest   '[]'$ Pain in legs when laying flat   '[x]'$ Claudication   '[]'$ Pain in feet when walking  '[x]'$ Pain in feet at rest  '[]'$ Pain in feet when laying flat   '[]'$ History of DVT   '[]'$ Phlebitis   '[]'$ Swelling in legs   '[]'$ Varicose veins   '[]'$ Non-healing ulcers Pulmonary:   '[]'$ Uses home oxygen   '[]'$ Productive cough   '[]'$ Hemoptysis   '[]'$ Wheeze  '[]'$ COPD   '[]'$ Asthma Neurologic:  '[]'$ Dizziness  '[]'$ Blackouts   '[]'$ Seizures   '[]'$ History of stroke   '[]'$ History of TIA  '[]'$ Aphasia   '[]'$ Temporary blindness   '[]'$ Dysphagia   '[]'$ Weakness or numbness in arms   '[x]'$ Weakness or numbness in legs Musculoskeletal:  '[]'$ Arthritis   '[]'$ Joint swelling   '[]'$ Joint pain   '[]'$ Low back pain Hematologic:  '[]'$ Easy bruising  '[]'$ Easy bleeding   '[]'$ Hypercoagulable state   '[]'$ Anemic   Gastrointestinal:  '[]'$ Blood in stool   '[]'$ Vomiting blood  '[]'$ Gastroesophageal reflux/heartburn   '[]'$ Abdominal pain Genitourinary:  '[]'$ Chronic kidney disease   '[]'$ Difficult urination  '[]'$ Frequent urination  '[]'$ Burning with urination   '[]'$ Hematuria Skin:  '[]'$ Rashes   '[]'$ Ulcers   '[]'$ Wounds Psychological:  '[]'$ History of anxiety   '[]'$  History of major depression.  Physical Examination  BP 109/66   Pulse 76   Resp 16   Ht 6' (1.829 m)  Gen:  WD/WN, NAD Head: [place]/AT, No temporalis wasting. Ear/Nose/Throat: Hearing grossly intact, nares w/o erythema or drainage, trachea midline Eyes: Conjunctiva clear. Sclera  non-icteric Neck: Supple.  No JVD.  Pulmonary:  Good air movement, no use of accessory muscles.  Cardiac: RRR, normal S1, S2 Vascular:  Vessel Right Left  Radial Palpable Palpable  Ulnar Palpable Palpable  Brachial Palpable Palpable  Carotid Palpable, without bruit Palpable, without bruit  Aorta Not palpable N/A  Femoral Palpable Palpable  Popliteal Not Palpable Palpable  PT Not Palpable 1+ Palpable  DP Not Palpable Palpable   Gastrointestinal: soft, non-tender/non-distended. No guarding/reflex.  Musculoskeletal: M/S 5/5 throughout.  No deformity or atrophy.  Neurologic:   Symmetrical.  Speech is fluent.  Psychiatric: Judgment intact, Mood & affect appropriate for pt's clinical situation. Dermatologic: No rashes or ulcers noted.  No cellulitis or open wounds. Lymph : No Cervical, Axillary, or Inguinal lymphadenopathy.      Labs Recent Results (from the past 2160 hour(s))  Protime-INR     Status: Abnormal   Collection Time: 05/12/16  1:47 PM  Result Value Ref Range   Prothrombin Time 22.1 (H) 11.4 - 15.2 seconds   INR 1.90   BUN     Status: None   Collection Time: 06/23/16  10:44 AM  Result Value Ref Range   BUN 12 6 - 20 mg/dL  Creatinine, serum     Status: None   Collection Time: 06/23/16 10:44 AM  Result Value Ref Range   Creatinine, Ser 1.05 0.61 - 1.24 mg/dL   GFR calc non Af Amer >60 >60 mL/min   GFR calc Af Amer >60 >60 mL/min    Comment: (NOTE) The eGFR has been calculated using the CKD EPI equation. This calculation has not been validated in all clinical situations. eGFR's persistently <60 mL/min signify possible Chronic Kidney Disease.   Protime-INR     Status: None   Collection Time: 06/25/16  8:00 AM  Result Value Ref Range   Prothrombin Time 14.3 11.4 - 15.2 seconds   INR 1.11     Radiology No results found.    Assessment/Plan  Hyperlipidemia lipid control important in reducing the progression of atherosclerotic disease. Continue statin  therapy   Essential hypertension blood pressure control important in reducing the progression of atherosclerotic disease. On appropriate oral medications.   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 This is only days after his previous intervention, and this is a recurrent problem now with 6 previous interventions. This is a very complex and difficult problem. This certainly raises the question of a heparin antibody as he does have heparin coated stents and receives heparin for the procedure. Before we perform any new intervention, I believe a heparin antibody assay should be ordered and we are going to try to get that done today. We will schedule him for an angiogram next week. I told him to expect to stay overnight on continuous thrombolytic therapy. He may ultimately require a bypass surgery but given his high risk of thrombosis, this could certainly worsen the situation. This is definitely a complex situation which has been difficult to improved to this point.  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 This is only days after his previous intervention, and this is a recurrent problem now with 6 previous interventions. This is a very complex and difficult problem. This certainly raises the question of a heparin antibody as he does have heparin coated stents and receives heparin for the procedure. Before we perform any new intervention, I believe a heparin antibody assay should be ordered and we are going to try to get that done today. We will schedule him for an angiogram next week. I told him to expect to stay overnight on continuous thrombolytic therapy. He may ultimately require a bypass surgery but given his high risk of thrombosis, this could certainly worsen the situation. This is definitely a complex  situation which has been difficult to improved to this point.    Leotis Pain, MD  06/27/2016 10:33 AM    This note was created with Dragon medical transcription system.  Any errors from dictation are purely unintentional

## 2016-06-27 NOTE — Assessment & Plan Note (Signed)
lipid control important in reducing the progression of atherosclerotic disease. Continue statin therapy  

## 2016-06-27 NOTE — Assessment & Plan Note (Signed)
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 This is only days after his previous intervention, and this is a recurrent problem now with 6 previous interventions. This is a very complex and difficult problem. This certainly raises the question of a heparin antibody as he does have heparin coated stents and receives heparin for the procedure. Before we perform any new intervention, I believe a heparin antibody assay should be ordered and we are going to try to get that done today. We will schedule him for an angiogram next week. I told him to expect to stay overnight on continuous thrombolytic therapy. He may ultimately require a bypass surgery but given his high risk of thrombosis, this could certainly worsen the situation. This is definitely a complex situation which has been difficult to improved to this point.

## 2016-06-28 LAB — HEPARIN INDUCED PLATELET AB (HIT ANTIBODY): Heparin Induced Plt Ab: 0.184 OD (ref 0.000–0.400)

## 2016-06-30 ENCOUNTER — Inpatient Hospital Stay
Admission: AD | Admit: 2016-06-30 | Discharge: 2016-07-02 | DRG: 272 | Disposition: A | Discharge status: Home / Self Care | Payer: BLUE CROSS/BLUE SHIELD | Source: Ambulatory Visit | Attending: Vascular Surgery | Admitting: Vascular Surgery

## 2016-06-30 ENCOUNTER — Encounter: Payer: Self-pay | Admitting: General Practice

## 2016-06-30 ENCOUNTER — Encounter
Admission: AD | Disposition: A | Discharge status: Home / Self Care | Payer: Self-pay | Source: Ambulatory Visit | Attending: Vascular Surgery

## 2016-06-30 DIAGNOSIS — I998 Other disorder of circulatory system: Secondary | ICD-10-CM | POA: Diagnosis present

## 2016-06-30 DIAGNOSIS — T82868A Thrombosis of vascular prosthetic devices, implants and grafts, initial encounter: Principal | ICD-10-CM | POA: Diagnosis present

## 2016-06-30 DIAGNOSIS — R7303 Prediabetes: Secondary | ICD-10-CM | POA: Diagnosis present

## 2016-06-30 DIAGNOSIS — I70221 Atherosclerosis of native arteries of extremities with rest pain, right leg: Secondary | ICD-10-CM | POA: Diagnosis present

## 2016-06-30 DIAGNOSIS — I70211 Atherosclerosis of native arteries of extremities with intermittent claudication, right leg: Secondary | ICD-10-CM

## 2016-06-30 DIAGNOSIS — I10 Essential (primary) hypertension: Secondary | ICD-10-CM | POA: Diagnosis present

## 2016-06-30 DIAGNOSIS — M79604 Pain in right leg: Secondary | ICD-10-CM | POA: Diagnosis present

## 2016-06-30 HISTORY — PX: PERIPHERAL VASCULAR CATHETERIZATION: SHX172C

## 2016-06-30 LAB — CBC
HCT: 37.9 % — ABNORMAL LOW (ref 40.0–52.0)
HCT: 36.1 % — ABNORMAL LOW (ref 40.0–52.0)
HCT: 34.2 % — ABNORMAL LOW (ref 40.0–52.0)
HEMOGLOBIN: 12.6 g/dL — AB (ref 13.0–18.0)
HEMOGLOBIN: 11.9 g/dL — AB (ref 13.0–18.0)
Hemoglobin: 11.6 g/dL — ABNORMAL LOW (ref 13.0–18.0)
MCH: 28.1 pg (ref 26.0–34.0)
MCH: 28.2 pg (ref 26.0–34.0)
MCH: 28.4 pg (ref 26.0–34.0)
MCHC: 33.3 g/dL (ref 32.0–36.0)
MCHC: 33.1 g/dL (ref 32.0–36.0)
MCHC: 34 g/dL (ref 32.0–36.0)
MCV: 84.5 fL (ref 80.0–100.0)
MCV: 85.2 fL (ref 80.0–100.0)
MCV: 83.7 fL (ref 80.0–100.0)
Platelets: 161 10*3/uL (ref 150–440)
Platelets: 162 10*3/uL (ref 150–440)
Platelets: 147 10*3/uL — ABNORMAL LOW (ref 150–440)
RBC: 4.49 MIL/uL (ref 4.40–5.90)
RBC: 4.23 MIL/uL — ABNORMAL LOW (ref 4.40–5.90)
RBC: 4.08 MIL/uL — ABNORMAL LOW (ref 4.40–5.90)
RDW: 14 % (ref 11.5–14.5)
RDW: 13.7 % (ref 11.5–14.5)
RDW: 13.8 % (ref 11.5–14.5)
WBC: 10.3 10*3/uL (ref 3.8–10.6)
WBC: 7.7 10*3/uL (ref 3.8–10.6)
WBC: 6.2 10*3/uL (ref 3.8–10.6)

## 2016-06-30 LAB — GLUCOSE, CAPILLARY: Glucose-Capillary: 116 mg/dL — ABNORMAL HIGH (ref 65–99)

## 2016-06-30 LAB — FIBRINOGEN
Fibrinogen: 443 mg/dL (ref 210–475)
Fibrinogen: 322 mg/dL (ref 210–475)
Fibrinogen: 218 mg/dL (ref 210–475)

## 2016-06-30 LAB — MRSA PCR SCREENING: MRSA by PCR: NEGATIVE

## 2016-06-30 SURGERY — LOWER EXTREMITY ANGIOGRAPHY
Anesthesia: Moderate Sedation | Site: Leg Lower | Laterality: Right

## 2016-06-30 MED ORDER — ACETAMINOPHEN 325 MG PO TABS: 650.0000 mg | ORAL_TABLET | Freq: Four times a day (QID) | ORAL | Status: DC | PRN

## 2016-06-30 MED ORDER — HEPARIN SODIUM (PORCINE) 1000 UNIT/ML IJ SOLN
INTRAMUSCULAR | Status: DC | PRN
  Administered 2016-06-30: 4000 [IU] via INTRAVENOUS

## 2016-06-30 MED ORDER — LIDOCAINE-EPINEPHRINE 1 %-1:100000 IJ SOLN
INTRAMUSCULAR | Status: AC
  Filled 2016-06-30: qty 1

## 2016-06-30 MED ORDER — IOPAMIDOL (ISOVUE-300) INJECTION 61%
INTRAVENOUS | Status: DC | PRN
  Administered 2016-06-30: 25 mL via INTRAVENOUS

## 2016-06-30 MED ORDER — HEPARIN (PORCINE) IN NACL 100-0.45 UNIT/ML-% IJ SOLN
INTRAMUSCULAR | Status: AC
  Filled 2016-06-30: qty 250

## 2016-06-30 MED ORDER — ASPIRIN EC 81 MG PO TBEC
81.0000 mg | DELAYED_RELEASE_TABLET | Freq: Every day | ORAL | Status: DC
  Administered 2016-06-30 – 2016-07-02 (×2): 81 mg via ORAL
  Filled 2016-06-30 (×2): qty 1

## 2016-06-30 MED ORDER — MIDAZOLAM HCL 2 MG/2ML IJ SOLN
INTRAMUSCULAR | Status: AC
  Filled 2016-06-30: qty 2

## 2016-06-30 MED ORDER — SODIUM CHLORIDE 0.9% FLUSH
3.0000 mL | Freq: Two times a day (BID) | INTRAVENOUS | Status: DC
  Administered 2016-06-30 – 2016-07-02 (×4): 3 mL via INTRAVENOUS

## 2016-06-30 MED ORDER — ALTEPLASE 2 MG IJ SOLR
1.0000 mg/h | INTRAMUSCULAR | Status: AC
  Administered 2016-06-30: 1 mg/h
  Filled 2016-06-30: qty 10

## 2016-06-30 MED ORDER — SODIUM CHLORIDE 0.9% FLUSH
3.0000 mL | Freq: Two times a day (BID) | INTRAVENOUS | Status: DC
  Administered 2016-06-30: 3 mL via INTRAVENOUS

## 2016-06-30 MED ORDER — HEPARIN (PORCINE) IN NACL 2-0.9 UNIT/ML-% IJ SOLN
INTRAMUSCULAR | Status: AC
  Filled 2016-06-30: qty 1000

## 2016-06-30 MED ORDER — DOCUSATE SODIUM 100 MG PO CAPS
100.0000 mg | ORAL_CAPSULE | Freq: Two times a day (BID) | ORAL | Status: DC
  Administered 2016-06-30 – 2016-07-02 (×3): 100 mg via ORAL
  Filled 2016-06-30 (×3): qty 1

## 2016-06-30 MED ORDER — MIDAZOLAM HCL 5 MG/5ML IJ SOLN
INTRAMUSCULAR | Status: AC
  Filled 2016-06-30: qty 5

## 2016-06-30 MED ORDER — SODIUM CHLORIDE 0.9 % IV SOLN: 250.0000 mL | INTRAVENOUS | Status: DC | PRN

## 2016-06-30 MED ORDER — ACETAMINOPHEN 650 MG RE SUPP: 650.0000 mg | Freq: Four times a day (QID) | RECTAL | Status: DC | PRN

## 2016-06-30 MED ORDER — SODIUM CHLORIDE 0.9 % IV SOLN: INTRAVENOUS | Status: DC

## 2016-06-30 MED ORDER — MORPHINE SULFATE (PF) 10 MG/ML IV SOLN
INTRAVENOUS | Status: AC
  Administered 2016-06-30: 5 mg
  Filled 2016-06-30: qty 1

## 2016-06-30 MED ORDER — FENTANYL CITRATE (PF) 100 MCG/2ML IJ SOLN
INTRAMUSCULAR | Status: DC | PRN
  Administered 2016-06-30 (×2): 50 ug via INTRAVENOUS

## 2016-06-30 MED ORDER — ONDANSETRON HCL 4 MG PO TABS
4.0000 mg | ORAL_TABLET | Freq: Four times a day (QID) | ORAL | Status: DC | PRN
  Filled 2016-06-30: qty 1

## 2016-06-30 MED ORDER — LISINOPRIL 20 MG PO TABS
20.0000 mg | ORAL_TABLET | Freq: Every day | ORAL | Status: DC
  Administered 2016-07-02: 20 mg via ORAL
  Filled 2016-06-30: qty 1

## 2016-06-30 MED ORDER — DEXTROSE-NACL 5-0.9 % IV SOLN
INTRAVENOUS | Status: DC
  Administered 2016-06-30 – 2016-07-02 (×4): via INTRAVENOUS

## 2016-06-30 MED ORDER — HEPARIN SODIUM (PORCINE) 1000 UNIT/ML IJ SOLN
INTRAMUSCULAR | Status: AC
  Filled 2016-06-30: qty 1

## 2016-06-30 MED ORDER — SODIUM CHLORIDE 0.9% FLUSH: 3.0000 mL | INTRAVENOUS | Status: DC | PRN

## 2016-06-30 MED ORDER — ONDANSETRON HCL 4 MG/2ML IJ SOLN
4.0000 mg | Freq: Four times a day (QID) | INTRAMUSCULAR | Status: DC | PRN
Start: 2016-06-30 — End: 2016-06-30

## 2016-06-30 MED ORDER — HEPARIN (PORCINE) IN NACL 100-0.45 UNIT/ML-% IJ SOLN
600.0000 [IU]/h | INTRAMUSCULAR | Status: DC
  Administered 2016-06-30: 600 [IU]/h via INTRAVENOUS

## 2016-06-30 MED ORDER — SODIUM CHLORIDE 0.9 % IV SOLN
0.5000 mg/h | INTRAVENOUS | Status: DC
  Administered 2016-06-30 – 2016-07-01 (×2): 0.5 mg/h
  Filled 2016-06-30 (×2): qty 10

## 2016-06-30 MED ORDER — OXYCODONE-ACETAMINOPHEN 5-325 MG PO TABS
1.0000 | ORAL_TABLET | ORAL | Status: DC | PRN
  Administered 2016-06-30: 2 via ORAL
  Filled 2016-06-30: qty 2

## 2016-06-30 MED ORDER — HYDROCHLOROTHIAZIDE 25 MG PO TABS
12.5000 mg | ORAL_TABLET | Freq: Every day | ORAL | Status: DC
  Administered 2016-06-30 – 2016-07-02 (×2): 12.5 mg via ORAL
  Filled 2016-06-30 (×2): qty 1

## 2016-06-30 MED ORDER — FLEET ENEMA 7-19 GM/118ML RE ENEM: 1.0000 | ENEMA | Freq: Once | RECTAL | Status: DC | PRN

## 2016-06-30 MED ORDER — ALTEPLASE 2 MG IJ SOLR
INTRAMUSCULAR | Status: AC
  Filled 2016-06-30: qty 10

## 2016-06-30 MED ORDER — FENTANYL CITRATE (PF) 100 MCG/2ML IJ SOLN
INTRAMUSCULAR | Status: AC
  Filled 2016-06-30: qty 2

## 2016-06-30 MED ORDER — ALTEPLASE 2 MG IJ SOLR
INTRAMUSCULAR | Status: DC | PRN
  Administered 2016-06-30: 10 mg

## 2016-06-30 MED ORDER — MIDAZOLAM HCL 2 MG/2ML IJ SOLN: 1.0000 mg | INTRAMUSCULAR | Status: DC | PRN

## 2016-06-30 MED ORDER — ACETAMINOPHEN 500 MG PO TABS: 1000.0000 mg | ORAL_TABLET | Freq: Four times a day (QID) | ORAL | Status: DC | PRN

## 2016-06-30 MED ORDER — MORPHINE SULFATE (PF) 4 MG/ML IV SOLN: 2.0000 mg | INTRAVENOUS | Status: DC | PRN

## 2016-06-30 MED ORDER — CEFAZOLIN IN D5W 1 GM/50ML IV SOLN
1.0000 g | Freq: Four times a day (QID) | INTRAVENOUS | Status: AC
  Administered 2016-06-30 – 2016-07-01 (×4): 1 g via INTRAVENOUS
  Filled 2016-06-30 (×4): qty 50

## 2016-06-30 MED ORDER — MIDAZOLAM HCL 2 MG/2ML IJ SOLN
INTRAMUSCULAR | Status: DC | PRN
  Administered 2016-06-30 (×2): 2 mg via INTRAVENOUS

## 2016-06-30 MED ORDER — SORBITOL 70 % SOLN
30.0000 mL | Freq: Every day | Status: DC | PRN
  Filled 2016-06-30: qty 30

## 2016-06-30 MED ORDER — MAGNESIUM HYDROXIDE 400 MG/5ML PO SUSP: 30.0000 mL | Freq: Every day | ORAL | Status: DC | PRN

## 2016-06-30 MED ORDER — HYDROCODONE-ACETAMINOPHEN 5-325 MG PO TABS: 1.0000 | ORAL_TABLET | ORAL | Status: DC | PRN

## 2016-06-30 MED ORDER — SIMVASTATIN 20 MG PO TABS
20.0000 mg | ORAL_TABLET | Freq: Every day | ORAL | Status: DC
  Administered 2016-06-30 – 2016-07-01 (×2): 20 mg via ORAL
  Filled 2016-06-30 (×2): qty 1

## 2016-06-30 MED ORDER — MORPHINE SULFATE (PF) 4 MG/ML IV SOLN: 5.0000 mg | INTRAVENOUS | Status: DC | PRN

## 2016-06-30 MED ORDER — ONDANSETRON HCL 4 MG/2ML IJ SOLN: 4.0000 mg | Freq: Four times a day (QID) | INTRAMUSCULAR | Status: DC | PRN

## 2016-06-30 SURGICAL SUPPLY — 7 items
CATH INFUS 135CMX50CM (CATHETERS) ×2 IMPLANT
CATH RIM 65CM (CATHETERS) ×2 IMPLANT
CATH VERT 100CM (CATHETERS) ×2 IMPLANT
GLIDEWIRE ADV .035X260CM (WIRE) ×2 IMPLANT
KIT CATH CVC 3 LUMEN 7FR 8IN (MISCELLANEOUS) ×2 IMPLANT
PACK ANGIOGRAPHY (CUSTOM PROCEDURE TRAY) ×2 IMPLANT
SHEATH ANL2 6FRX45 HC (SHEATH) ×2 IMPLANT

## 2016-06-30 NOTE — Progress Notes (Signed)
Pt remains clinically stable post lower extremity angiogram via left groin approach with lysis catheter placement. tpa infusion as per orders at '1mg'$  iv x 4 hours then 0.'5mg'$  iv thereafter. Heparin inufusion per sheath per orders. No bleeding nor hematoma visible at left groin,family at bedside. Dr Lucky Cowboy out to speak with patient with questions answered, to transfer to ccu overnight with tpa infusion and return for procedure in am,sr per monitor, given morphine recent hour for leg discomfort with pain easing off after. Will cont. To monitor.

## 2016-06-30 NOTE — H&P (Signed)
[facility] History & Physical Update  The patient was interviewed and re-examined.  The patient's previous History and Physical has been reviewed and his right leg has ischemia again.  He is numb and the foot is painful.  He will need another angiogram and we will plan thrombolytic therapy to address his ischemia. We plan to proceed with the scheduled procedure.  Leotis Pain, MD  06/30/2016, 9:26 AM

## 2016-06-30 NOTE — Op Note (Signed)
[facility]   PROCEDURE NOTE  PROCEDURE: 1. Right internal jugular triple lumen central venous catheter placement 2. Right internal jugular cannulation under ultrasound guidance 3. Fluoroscopic guidance for placement of the catheter  PRE-OPERATIVE DIAGNOSIS: Ischemic right lower extremity  POST-OPERATIVE DIAGNOSIS: same as above  SURGEON: Leotis Pain, MD  ANESTHESIA:  None  ESTIMATED BLOOD LOSS: minimal  FINDING(S): none  SPECIMEN(S):  none  INDICATIONS:   Joshua [name] is a 60 y.o. male who presents with need for venous access.  The patient presents for central venous catheter placement at the conclusion of his angiogram with thrombolytic therapy that will be continued overnight requiring multiple blood draws and a high risk of bleeding.  The patient is aware the risks of central venous catheter placement include but are not limited to: bleeding, infection, central venous injury, pneumothorax, possible venous stenosis, possible malpositioning in the venous system, and possible infections related to long-term catheter presence. The patient was aware of these risks and agreed to proceed.  DESCRIPTION: After written informed consent was obtained from the patient and/or family, the patient was placed supine in the hospital bed.  The patient was prepped with chloraprep and draped in the standard fashion for a chest or neck central venous catheter placement.  I anesthesized the neck cannulation site with 1% lidocaine then under ultrasound guidance, the right internal jugular vein was cannulated with the 18 gauge needle and a permanent image was recorded.  A J wire was then placed down in the superior vena cava.  After a skin nick and dilatation, the triple lumen central venous catheter was placed over the wire and the wire was removed. As we were in the special procedures area and just perform his leg angiogram, fluoroscopy was readily available and was used to  parked the catheter at the cavoatrial junction which was at about 18 cm at the skin. Each port was aspirated and flushed with sterile normal saline.  The catheter was secured in placed with three interrupted stitches of 3-0 Silk tied to the catheter.  The catheter was dressed with sterile dressing.    COMPLICATIONS: none apparent  CONDITION: stable  Leotis Pain 06/30/2016, 11:40 AM     This note was created with Dragon Medical transcription system. Any errors in dictation are purely unintentional.

## 2016-06-30 NOTE — Progress Notes (Signed)
Pt remains clinically stable, vitals stable, pt denies complaints at this time. sr per monitor. Report called to Buck Mam RN with plan reviewed.

## 2016-06-30 NOTE — Op Note (Signed)
[facility] Percutaneous Study/Intervention Procedural Note   Date of Surgery: 06/30/2016  Surgeon(s):Chyann Ambrocio   Assistants:none  Pre-operative Diagnosis: PAD with rest pain right lower extremity, early thrombosis of a recent intervention  Post-operative diagnosis: Same  Procedure(s) Performed: 1. Ultrasound guidance for vascular access left femoral artery 2. Catheter placement into right popliteal artery from left femoral approach 3. Selective right lower extremity angiogram 4. Placement of a 120 cm total length 50 cm working length infusion catheter for continued thrombolytic therapy overnight as well as the installation of 10 mg of TPA to the right SFA and popliteal arteries through the catheter today.  EBL: Minimal  Contrast: 25 cc  Fluoro Time: 3.7 minutes  Moderate Conscious Sedation Time: approximately 25 minutes using 4 mg of Versed and 100 mcg of Fentanyl  Indications: Patient is a 60 y.o.male with an early rethrombosis of a right lower extremity intervention recurrent ischemia of the right lower extremity. The patient has noninvasive study showing thrombosis of the SFA and popliteal stents. The patient is brought in for angiography for further evaluation and potential treatment. Risks and benefits are discussed and informed consent is obtained  Procedure: The patient was identified and appropriate procedural time out was performed. The patient was then placed supine on the table and prepped and draped in the usual sterile fashion.Moderate conscious sedation was administered during a face to face encounter with the patient throughout the procedure with my supervision of the RN administering medicines and monitoring the patient's vital signs, pulse oximetry, telemetry and mental status throughout from the start of the procedure until the patient was taken to the recovery room.  Ultrasound was used to evaluate the left common femoral artery. It was patent . A digital ultrasound image was acquired. A Seldinger needle was used to access the left common femoral artery under direct ultrasound guidance and a permanent image was performed. A 0.035 J wire was advanced without resistance and a 5Fr sheath was placed. An aortogram was not performed as one was performed recently and was normal. I then crossed the aortic bifurcation and advanced to the right femoral head with a rim catheter. Selective right lower extremity angiogram was then performed. This demonstrated recurrent occlusion of the SFA at its origin with occlusion of all the previously placed SFA and popliteal stents. Reconstitution of the popliteal artery just below the knee with two-vessel runoff distally. The patient was systemically heparinized and a 6 [place] Ansell sheath was then placed over the [employer] wire. I then used a Kumpe catheter and the advantage wire to navigate through the occlusion with no difficulty. Intraluminal flow was confirmed in the below-knee popliteal artery which confirmed the two-vessel runoff distally. Given the recent intervention and the fresh nature of the thrombosis, I elected to perform catheter directed thrombolytic therapy. I selected a 120 cm total length 50 cm working length infusion catheter. This was placed to encompass the entire SFA and popliteal artery to just above the tibioperoneal trunk. 10 mg of TPA were then instilled through the catheter for catheter directed thrombolytic therapy initially, and the catheter was left in place for continued infusion of TPA overnight. It was secured to the skin with a Prolene suture as was the 6 [place] sheath. I elected to terminate the procedure. A central line was placed which will be dictated separately. The patient was taken to the recovery room in stable condition having tolerated the procedure well.  Findings:    Right Lower Extremity: recurrent occlusion of  the SFA at its origin with occlusion of all the previously placed SFA and popliteal stents. Reconstitution of the popliteal artery just below the knee with two-vessel runoff distally.   Disposition: Patient was taken to the recovery room in stable condition having tolerated the procedure well.  Complications: None  Leotis Pain 06/30/2016 10:57 AM   This note was created with Dragon Medical transcription system. Any errors in dictation are purely unintentional.

## 2016-07-01 ENCOUNTER — Encounter
Admission: AD | Disposition: A | Discharge status: Home / Self Care | Payer: Self-pay | Source: Ambulatory Visit | Attending: Vascular Surgery

## 2016-07-01 ENCOUNTER — Encounter: Payer: Self-pay | Admitting: Vascular Surgery

## 2016-07-01 ENCOUNTER — Ambulatory Visit
Admission: RE | Admit: 2016-07-01 | Payer: BLUE CROSS/BLUE SHIELD | Source: Ambulatory Visit | Admitting: Vascular Surgery

## 2016-07-01 DIAGNOSIS — I70211 Atherosclerosis of native arteries of extremities with intermittent claudication, right leg: Secondary | ICD-10-CM

## 2016-07-01 HISTORY — PX: PERIPHERAL VASCULAR CATHETERIZATION: SHX172C

## 2016-07-01 LAB — BASIC METABOLIC PANEL
ANION GAP: 4 — AB (ref 5–15)
BUN: 15 mg/dL (ref 6–20)
CALCIUM: 8.4 mg/dL — AB (ref 8.9–10.3)
CHLORIDE: 105 mmol/L (ref 101–111)
CO2: 28 mmol/L (ref 22–32)
CREATININE: 0.97 mg/dL (ref 0.61–1.24)
GFR calc non Af Amer: >60 mL/min (ref >60)
Glucose, Bld: 156 mg/dL — ABNORMAL HIGH (ref 65–99)
Potassium: 3.7 mmol/L (ref 3.5–5.1)
Sodium: 137 mmol/L (ref 135–145)

## 2016-07-01 LAB — FIBRINOGEN: Fibrinogen: 167 mg/dL — ABNORMAL LOW (ref 210–475)

## 2016-07-01 LAB — CBC
HEMATOCRIT: 35.5 % — AB (ref 40.0–52.0)
Hemoglobin: 12.1 g/dL — ABNORMAL LOW (ref 13.0–18.0)
MCH: 28.9 pg (ref 26.0–34.0)
MCHC: 34.1 g/dL (ref 32.0–36.0)
MCV: 84.7 fL (ref 80.0–100.0)
PLATELETS: 136 10*3/uL — AB (ref 150–440)
RBC: 4.2 MIL/uL — AB (ref 4.40–5.90)
RDW: 14.1 % (ref 11.5–14.5)
WBC: 5.5 10*3/uL (ref 3.8–10.6)

## 2016-07-01 SURGERY — LOWER EXTREMITY ANGIOGRAPHY
Anesthesia: Moderate Sedation | Site: Leg Lower | Laterality: Right

## 2016-07-01 SURGERY — LOWER EXTREMITY ANGIOGRAPHY
Anesthesia: Moderate Sedation | Laterality: Right

## 2016-07-01 MED ORDER — MIDAZOLAM HCL 2 MG/2ML IJ SOLN
INTRAMUSCULAR | Status: DC | PRN
Start: 2016-07-01 — End: 2016-07-01
  Administered 2016-07-01: 0.5 mg via INTRAVENOUS
  Administered 2016-07-01: 2 mg via INTRAVENOUS
  Administered 2016-07-01 (×2): 0.5 mg via INTRAVENOUS
  Administered 2016-07-01: 1 mg via INTRAVENOUS

## 2016-07-01 MED ORDER — TIROFIBAN HCL IN NACL 5-0.9 MG/100ML-% IV SOLN
0.1500 ug/kg/min | INTRAVENOUS | Status: AC
  Administered 2016-07-01 – 2016-07-02 (×3): 0.15 ug/kg/min via INTRAVENOUS
  Filled 2016-07-01 (×2): qty 100

## 2016-07-01 MED ORDER — TIROFIBAN HCL IV 12.5 MG/250 ML
INTRAVENOUS | Status: AC
  Administered 2016-07-01: 2357.5 ug via INTRAVENOUS
  Filled 2016-07-01: qty 250

## 2016-07-01 MED ORDER — LIDOCAINE-EPINEPHRINE 1 %-1:100000 IJ SOLN
INTRAMUSCULAR | Status: AC
  Filled 2016-07-01: qty 1

## 2016-07-01 MED ORDER — APIXABAN 5 MG PO TABS: 5.0000 mg | ORAL_TABLET | Freq: Two times a day (BID) | ORAL | Status: DC

## 2016-07-01 MED ORDER — TIROFIBAN (AGGRASTAT) BOLUS VIA INFUSION
25.0000 ug/kg | Freq: Once | INTRAVENOUS | Status: AC
  Administered 2016-07-01: 2357.5 ug via INTRAVENOUS

## 2016-07-01 MED ORDER — HEPARIN (PORCINE) IN NACL 2-0.9 UNIT/ML-% IJ SOLN
INTRAMUSCULAR | Status: AC
  Filled 2016-07-01: qty 1000

## 2016-07-01 MED ORDER — FENTANYL CITRATE (PF) 100 MCG/2ML IJ SOLN
INTRAMUSCULAR | Status: AC
  Filled 2016-07-01: qty 2

## 2016-07-01 MED ORDER — LIDOCAINE HCL (PF) 1 % IJ SOLN
INTRAMUSCULAR | Status: AC
  Filled 2016-07-01: qty 30

## 2016-07-01 MED ORDER — APIXABAN 5 MG PO TABS
5.0000 mg | ORAL_TABLET | Freq: Two times a day (BID) | ORAL | Status: DC
  Administered 2016-07-02: 5 mg via ORAL
  Filled 2016-07-01: qty 1

## 2016-07-01 MED ORDER — HEPARIN SODIUM (PORCINE) 1000 UNIT/ML IJ SOLN
INTRAMUSCULAR | Status: DC | PRN
  Administered 2016-07-01: 4000 [IU] via INTRAVENOUS

## 2016-07-01 MED ORDER — IOPAMIDOL (ISOVUE-300) INJECTION 61%
INTRAVENOUS | Status: DC | PRN
  Administered 2016-07-01: 60 mL via INTRA_ARTERIAL

## 2016-07-01 MED ORDER — FENTANYL CITRATE (PF) 100 MCG/2ML IJ SOLN
INTRAMUSCULAR | Status: DC | PRN
  Administered 2016-07-01: 50 ug via INTRAVENOUS
  Administered 2016-07-01: 25 ug via INTRAVENOUS
  Administered 2016-07-01: 50 ug via INTRAVENOUS
  Administered 2016-07-01 (×2): 25 ug via INTRAVENOUS

## 2016-07-01 SURGICAL SUPPLY — 18 items
BALLN ARMADA 7X20X80 (BALLOONS) ×3
BALLN ULTRV 018 7X40X75 (BALLOONS) ×3
BALLOON ARMADA 7X20X80 (BALLOONS) ×2 IMPLANT
BALLOON ULTRV 018 7X40X75 (BALLOONS) ×2 IMPLANT
CATH BERNSTEIN 5FR 130CM (CATHETERS) ×3 IMPLANT
CATH INDIGO 6 ST-TIP 135CM (CATHETERS) ×3 IMPLANT
CATH INDIGO SEP 6 (CATHETERS) ×3 IMPLANT
DEVICE PRESTO INFLATION (MISCELLANEOUS) ×3 IMPLANT
DEVICE STARCLOSE SE CLOSURE (Vascular Products) ×3 IMPLANT
PACK ANGIOGRAPHY (CUSTOM PROCEDURE TRAY) ×3 IMPLANT
PREP CHG 10.5 TEAL (MISCELLANEOUS) ×3 IMPLANT
SHEATH RAABE 7FR (SHEATH) ×3 IMPLANT
STENT VIABAHN 7X25X120 (Permanent Stent) ×1 IMPLANT
STENT VIABAHN2.5X120X7X (Permanent Stent) ×2 IMPLANT
TUBING ASPIRATION INDIGO (MISCELLANEOUS) ×3 IMPLANT
WIRE G V18X300CM (WIRE) ×3 IMPLANT
WIRE HI TORQ VERSACORE 300 (WIRE) ×3 IMPLANT
WIRE J 3MM .035X145CM (WIRE) ×3 IMPLANT

## 2016-07-01 NOTE — Progress Notes (Signed)
Dr Delana Meyer at bedside speaking with pt post procedure before transfer back to CCU . Pt verbalizes understanding of plan of care.

## 2016-07-01 NOTE — Plan of Care (Signed)
BP 124/65   Pulse 68   Temp 98.4 F (36.9 C) (Axillary)   Resp 17   Ht 6' (1.829 m)   Wt 94.3 kg (208 lb)   SpO2 99%   BMI 28.21 kg/m    Intake/Output Summary (Last 24 hours) at 07/01/16 1117 Last data filed at 07/01/16 1100  Gross per 24 hour  Intake          1846.25 ml  Output             2175 ml  Net          -328.75 ml    POC reviewed with patient, cont on q2 turns, vital signs closely monitored and any concerns addressed at this time. Will continue to monitor.  Pt has palpable weak pulses to BLE , skin warm to touch, pt can wiggle toes and feel sensation upon touch    Arrival Method: Bed accompanied by 2 RNs  Mental Orientation: A&O x 4 Telemetry: Pt placed on monitor. Central tele and Elink aware of pt's transfer Assessment: Completed Skin: wnl  IV: flushes easily, no pain, no blood return Pain: no pain  Environmental changes completed to facilitate rest and relaxation.  Safety Measures: Bed alarm obn, 2/4 bed rails up.  Unit Orientation: Pt and family oriented to room, has received patient guide, and taught how to use call bell system.

## 2016-07-01 NOTE — Op Note (Signed)
[facility] Percutaneous Study/Intervention Procedural Note   Date of Surgery: 07/01/2016  Surgeon:  Katha Cabal, MD.  Pre-operative Diagnosis: Recurrent ischemia right lower extremity status post recent intervention   Post-operative diagnosis: Same  Procedure(s) Performed: 1. Introduction catheter into right lower extremity 3rd order catheter placement  2. Follow-up contrast injection right lower extremity for distal runoff status post thrombolysis   3. Percutaneous transluminal angioplasty and stent placement right proximal superficial femoral artery  4. Mechanical thrombectomy with the Penumbra catheter                  5.  Star close closure left common femoral arteriotomy  Anesthesia: Conscious sedation was administered under my direct supervision. IV Versed plus fentanyl were utilized. Continuous ECG, pulse oximetry and blood pressure was monitored throughout the entire procedure.  Conscious sedation was for a total of 70 minutes.  Sheath: 7 French Rabi left common femoral  Contrast: 60 cc  Fluoroscopy Time: 5.7 minutes  Indications: [name] presents with ischemia of the right lower extremity. Last week he underwent recanalization of an occluded superficial femoral artery. Yesterday thrombolytic therapy was initiated and he is now being returned to the lab for follow-up angiography with intervention. The risks and benefits are reviewed all questions answered patient agrees to proceed.  Procedure: RANDOL ZUMSTEIN is a 60 y.o. y.o. male who was identified and appropriate procedural time out was performed. The patient was then placed supine on the table and prepped and draped in the usual sterile fashion.   The existing thrombolytic catheter and sheath are prepped and draped in a sterile fashion. The thrombolytic catheter is then exchanged over a versa core wire and a 135 Kumpe  catheter is advanced down to the distal popliteal. Distal runoff is then performed by hand injection. Proximal imaging is performed by hand injection through the sheath. The first a core wires then reintroduced.   4000 units of heparin was then given and allowed to circulate. The 6 French penumbra catheter is then advanced into the proximal SFA within the previously stented segment. There is a focal area of residual thrombus approximately 15 mm in length. This is then treated with multiple passes of the penumbra. Follow-up imaging demonstrates there is still a significant residual piece of thrombus there.  The 6 French sheath is then exchanged for a 7 [place] rabies and using the KMP catheter the versa core wire is exchanged for the VAT wire. Subsequently a 7 x 2.5 Viabahn stent is advanced across this area and deployed without difficulty. It is postdilated with a 7 mm ultra versed balloon.  Distal runoff was then reassessed. Imaging in the AP as well as the oblique of the proximal SFA demonstrates a fully expanded stent no abnormalities identified. The remaining portion of the SFA popliteal is widely patent trifurcation is patent there is preservation of the two-vessel runoff via the posterior tibial and peroneal. Anterior tibial remains occluded as was noted on previous angiograms.  After review of these images the sheath is pulled into the left external iliac oblique of the common femoral is obtained and a Star close device deployed. There no immediate complications.   Findings:   The right common femoral is widely patent as is the profunda femoris.  The SFA does indeed have a significant stenosis in its proximal portion which is improved but persists after mechanical thrombectomy. The remaining portion of the SFA popliteal is widely patent trifurcation is patent there is preservation  of the two-vessel runoff via the posterior tibial and peroneal. Anterior tibial remains occluded as was noted on  previous angiograms.  Following and plasty and stent placement there is complete resolution of this proximal lesion. Distal runoff is preserved as noted above.   Summary: Successful thrombolysis with recanalization right lower extremity for limb salvage    Disposition: Patient was taken to the recovery room in stable condition having tolerated the procedure well.  Magalene Mclear, Dolores Lory 07/01/2016,9:28 AM

## 2016-07-02 DIAGNOSIS — I70211 Atherosclerosis of native arteries of extremities with intermittent claudication, right leg: Secondary | ICD-10-CM

## 2016-07-02 NOTE — Progress Notes (Signed)
Pt A&Ox4; VSS.  A very small amount of blood at left groin site noticed under PAD at beginning of shift.  Area monitored closely throughout shift and has not appeared to bleed any more.  No hematoma noted no complaints of pain or swelling from patient.  40cc air remains in PAD at this time.  Pt remains on aggrastat drip at this time.  Report to be given to [place], Therapist, sports.

## 2016-07-02 NOTE — Progress Notes (Signed)
Small serous filled blister noted at left groin site when PAD was removed.  New gauze dressing with tegaderm applied.  No bleeding or hematoma noted at left groin site.

## 2016-07-02 NOTE — Discharge Instructions (Signed)
Remove dressing and may shower as of tomorrow.   Dr. Bunnie Domino office will call you with appointment.  You are on a wait list at this time.  If you have not heard from Dr. Bunnie Domino office within 2 weeks please call them.  [phone].

## 2016-07-02 NOTE — Progress Notes (Signed)
Discharge instructions given to and reviewed with patient.  Patient verbalized understanding of all discharge instructions including that Dr. Bunnie Domino office will call patient with an appointment date and time because there is a wait list at this time.  Patient dressing at this time and waiting for his ride home.

## 2016-07-02 NOTE — Progress Notes (Signed)
Dr. Delana Meyer present and gave order to stop IVF.  RN discussed with MD that air was not removed from PAD overnight and that this RN started deflating it just recently.  MD acknowledged and stated that once all air was removed patient could ambulate if no bleeding occured.

## 2016-07-02 NOTE — Discharge Summary (Signed)
[facility]    Discharge Summary    Patient ID:  Joshua [name] MRN: [id number] DOB/AGE: 1955/08/20 60 y.o.  Admit date: 06/30/2016 Discharge date: 07/02/2016 Date of Surgery: 07/01/2016 Surgeon: Surgeon(s): Katha Cabal, MD  Admission Diagnosis: Pt to be admitted Mon 12 11    RT leg angio w TPA infusion     ASO w claudication ischemic leg  Discharge Diagnoses:  Pt to be admitted Mon 12 11    RT leg angio w TPA infusion     ASO w claudication ischemic leg  Secondary Diagnoses: Past Medical History:  Diagnosis Date  . Hypertension   . Peripheral vascular disease ([place])   . Pre-diabetes     Procedure(s): Lower Extremity Angiography Lower Extremity Intervention  Discharged Condition: good  HPI:  Patient is a 60 y.o.male with an early rethrombosis of a right lower extremity intervention recurrent ischemia of the right lower extremity. The patient has noninvasive study showing thrombosis of the SFA and popliteal stents. On 06/30/16, the patient underwent ultrasound guidance for vascular access left femoral artery, catheter placement into right popliteal artery from left femoral approach, selective right lower extremity angiogram, placement of a 120 cm total length 50 cm working length infusion catheter for continued thrombolytic therapy overnight as well as the installation of 10 mg of TPA to the right SFA and popliteal arteries through the catheter today. He tolerated the procedure well and was transferred to the ICU overnight for heparin gtt and Aggrastat infusion. His night of procedure was unremarkable. The next morning the patient was taken back to angiography and underwent introduction catheter into right lower extremity 3rd order catheter placement, follow-up contrast injection right lower extremity for distal runoff status post thrombolysis, percutaneous transluminal angioplasty and stent placement right proximal superficial femoral artery,  mechanical thrombectomy with the Penumbra catheter and star close closure left common femoral arteriotomy. He tolerated this procedure well and was transferred to the ICU for Aggrastat infusion overnight. The next day, Aggrastat was finished and his Eliquis was started. Successful revascularization of the extremity was obtained.   Hospital Course:  Joshua [name] is a 60 y.o. male is S/P Right  Procedure(s): As Above.  Physical exam:  A&Ox3, NAD Neck: Central Line Intact. No bleeding or swelling noted.  CV: RRR Pulm: CTA Bilaterally Abdomen: Soft, Non-tender, Non-Distended Left Groin: No swelling or drainage noted Vascular: Right Lower Extremity - warm, +DP  Post-op wounds clean, dry, intact or healing well  Pt. Ambulating, voiding and taking PO diet without difficulty.  Pt pain controlled with PO pain med's.  Labs as below  Complications:none  Consults:  None  Significant Diagnostic Studies: CBC Lab Results  Component Value Date   WBC 5.5 07/01/2016   HGB 12.1 (L) 07/01/2016   HCT 35.5 (L) 07/01/2016   MCV 84.7 07/01/2016   PLT 136 (L) 07/01/2016   BMET    Component Value Date/Time   NA 137 07/01/2016 0418   K 3.7 07/01/2016 0418   CL 105 07/01/2016 0418   CO2 28 07/01/2016 0418   GLUCOSE 156 (H) 07/01/2016 0418   BUN 15 07/01/2016 0418   BUN 16 07/03/2014 1205   CREATININE 0.97 07/01/2016 0418   CREATININE 1.34 (H) 07/03/2014 1205   CALCIUM 8.4 (L) 07/01/2016 0418   GFRNONAA >60 07/01/2016 0418   GFRNONAA 59 (L) 07/03/2014 1205   GFRAA >60 07/01/2016 0418   GFRAA >60 07/03/2014 1205   COAG Lab Results  Component Value Date  INR 1.11 06/25/2016   INR 1.90 05/12/2016   INR 1.20 12/05/2015   Disposition:  Discharge to :Home    Medication List    TAKE these medications   acetaminophen 500 MG tablet Commonly known as:  TYLENOL Take 1,000 mg by mouth every 6 (six) hours as needed (for pain.).   aspirin EC 81 MG tablet Take 81 mg by mouth  daily.   ELIQUIS 5 MG Tabs tablet Generic drug:  apixaban TAKE 1 TABLET (5 MG) BY MOUTH TWICE DAILY   hydrochlorothiazide 12.5 MG tablet Commonly known as:  HYDRODIURIL Take 12.5 mg by mouth daily.   lisinopril 20 MG tablet Commonly known as:  PRINIVIL,ZESTRIL Take 20 mg by mouth daily.   simvastatin 20 MG tablet Commonly known as:  ZOCOR Take 20 mg by mouth daily at 6 PM.      Verbal and written Discharge instructions given to the patient. Wound care per Discharge AVS Follow-up Information    Leotis Pain, MD Follow up in 1 month(s).   Specialties:  Vascular Surgery, Radiology, Interventional Cardiology Why:  ABI and Right Arterial Duplex Contact information: [place] [address] [phone]          Signed: Sela Hua, PA-C  07/02/2016, 11:41 AM

## 2016-07-02 NOTE — Progress Notes (Signed)
Patient left ICU by wheelchair with volunteer, alert with no distress noted.  Patient discharged home with his Aunt driving.

## 2016-07-23 ENCOUNTER — Other Ambulatory Visit (INDEPENDENT_AMBULATORY_CARE_PROVIDER_SITE_OTHER): Payer: Self-pay | Admitting: Vascular Surgery

## 2016-07-23 DIAGNOSIS — I739 Peripheral vascular disease, unspecified: Secondary | ICD-10-CM

## 2016-07-24 ENCOUNTER — Ambulatory Visit (INDEPENDENT_AMBULATORY_CARE_PROVIDER_SITE_OTHER): Payer: BLUE CROSS/BLUE SHIELD

## 2016-07-24 ENCOUNTER — Other Ambulatory Visit (INDEPENDENT_AMBULATORY_CARE_PROVIDER_SITE_OTHER): Payer: Self-pay | Admitting: Vascular Surgery

## 2016-07-24 ENCOUNTER — Ambulatory Visit (INDEPENDENT_AMBULATORY_CARE_PROVIDER_SITE_OTHER): Payer: BLUE CROSS/BLUE SHIELD | Admitting: Vascular Surgery

## 2016-07-24 ENCOUNTER — Encounter (INDEPENDENT_AMBULATORY_CARE_PROVIDER_SITE_OTHER): Payer: Self-pay

## 2016-07-24 ENCOUNTER — Encounter (INDEPENDENT_AMBULATORY_CARE_PROVIDER_SITE_OTHER): Payer: Self-pay | Admitting: Vascular Surgery

## 2016-07-24 VITALS — BP 115/65 | HR 59 | Resp 16 | Ht 72.0 in | Wt 210.0 lb

## 2016-07-24 DIAGNOSIS — E785 Hyperlipidemia, unspecified: Secondary | ICD-10-CM

## 2016-07-24 DIAGNOSIS — I743 Embolism and thrombosis of arteries of the lower extremities: Secondary | ICD-10-CM

## 2016-07-24 DIAGNOSIS — I10 Essential (primary) hypertension: Secondary | ICD-10-CM | POA: Diagnosis not present

## 2016-07-24 DIAGNOSIS — Z01810 Encounter for preprocedural cardiovascular examination: Secondary | ICD-10-CM

## 2016-07-24 DIAGNOSIS — I739 Peripheral vascular disease, unspecified: Secondary | ICD-10-CM

## 2016-07-31 ENCOUNTER — Encounter
Admission: RE | Admit: 2016-07-31 | Discharge: 2016-07-31 | Disposition: A | Discharge status: Home / Self Care | Payer: BLUE CROSS/BLUE SHIELD | Source: Ambulatory Visit | Attending: Vascular Surgery | Admitting: Vascular Surgery

## 2016-07-31 ENCOUNTER — Other Ambulatory Visit (INDEPENDENT_AMBULATORY_CARE_PROVIDER_SITE_OTHER): Payer: Self-pay | Admitting: Vascular Surgery

## 2016-07-31 HISTORY — DX: Dyspnea, unspecified: R06.00

## 2016-07-31 NOTE — Progress Notes (Signed)
Subjective:    Patient ID: [name], male    DOB: 11-18-1955, 61 y.o.   MRN: [id number] Chief Complaint  Patient presents with  . Re-evaluation    Ultrasound follow up   Patient presents for his first post-procedure follow up. 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 His most recent was on 06/30/16-07/01/16. He continues to experiences RLE claudication. He presents today to review vascular studies. He underwent an ABI which was notable for Right ABI: 0.52 and Left 1.26 (on 11/21/15, Right ABI: 0.44 and Left 1.16). A bilateral lower extremity vein mapping was notable for Right: SSV: Occluded GSV: 0.2-0.7 / Left: SSV: 0.2 and GSV: 0.1-0.6. Denies any fever, nausea or vomiting. Denies any ulcer development to the RLE.    Review of Systems  Constitutional: Negative.   HENT: Negative.   Eyes: Negative.   Respiratory: Negative.   Cardiovascular:       RLE Claudication  Gastrointestinal: Negative.   Endocrine: Negative.   Genitourinary: Negative.   Musculoskeletal: Negative.   Skin: Negative.   Allergic/Immunologic: Negative.   Neurological: Negative.   Hematological: Negative.   Psychiatric/Behavioral: Negative.       Objective:   Physical Exam  Constitutional: He is oriented to person, place, and time. He appears well-developed and well-nourished.  HENT:  Head: Normocephalic and atraumatic.  Right Ear: External ear normal.  Left Ear: External ear normal.  Eyes: Conjunctivae are normal. Pupils are equal, round, and reactive to light.  Neck: Normal range of motion.  Cardiovascular: Normal rate, regular rhythm, normal heart sounds and intact distal pulses.   Pulses:      Radial pulses are 2+ on the right side, and 2+ on the left side.       Dorsalis pedis pulses are 0 on the right side, and 2+ on the left side.       Posterior tibial pulses are 0 on the right side, and 2+ on the left side.    Pulmonary/Chest: Effort normal and breath sounds normal.  Abdominal: Soft. Bowel sounds are normal.  Musculoskeletal: Normal range of motion.  Neurological: He is alert and oriented to person, place, and time.  Skin: Skin is warm and dry.  Psychiatric: He has a normal mood and affect. His behavior is normal. Judgment and thought content normal.   BP 115/65 (BP Location: Right Arm)   Pulse (!) 59   Resp 16   Ht 6' (1.829 m)   Wt 210 lb (95.3 kg)   BMI 28.48 kg/m   Past Medical History:  Diagnosis Date  . Dyspnea   . Hypertension   . Peripheral vascular disease ([place])   . Pre-diabetes     Social History   Social History  . Marital status: Single    Spouse name: N/A  . Number of children: N/A  . Years of education: N/A   Occupational History  . Not on file.   Social History Main Topics  . Smoking status: Former Smoker    Packs/day: 1.50    Types: Cigarettes    Quit date: 12/20/2014  . Smokeless tobacco: Former Systems developer  . Alcohol use No  . Drug use: No  . Sexual activity: Not on file   Other Topics Concern  . Not on file   Social History Narrative  . No narrative on file    Past Surgical History:  Procedure Laterality Date  . BACK SURGERY  2011  . COLONOSCOPY  2013 ?  Marland Kitchen  PERIPHERAL VASCULAR CATHETERIZATION Right 01/02/2015   Procedure: Lower Extremity Angiography;  Surgeon: Katha Cabal, MD;  Location: [facility];  Service: Cardiovascular;  Laterality: Right;  . PERIPHERAL VASCULAR CATHETERIZATION Right 06/18/2015   Procedure: Lower Extremity Angiography;  Surgeon: Algernon Huxley, MD;  Location: [facility];  Service: Cardiovascular;  Laterality: Right;  . PERIPHERAL VASCULAR CATHETERIZATION  06/18/2015   Procedure: Lower Extremity Intervention;  Surgeon: Algernon Huxley, MD;  Location: [facility];  Service: Cardiovascular;;  . PERIPHERAL VASCULAR CATHETERIZATION N/A 10/09/2015   Procedure: Abdominal Aortogram w/Lower Extremity;  Surgeon:  Katha Cabal, MD;  Location: [facility];  Service: Cardiovascular;  Laterality: N/A;  . PERIPHERAL VASCULAR CATHETERIZATION  10/09/2015   Procedure: Lower Extremity Intervention;  Surgeon: Katha Cabal, MD;  Location: [facility];  Service: Cardiovascular;;  . PERIPHERAL VASCULAR CATHETERIZATION Right 10/10/2015   Procedure: Lower Extremity Angiography;  Surgeon: Algernon Huxley, MD;  Location: [facility];  Service: Cardiovascular;  Laterality: Right;  . PERIPHERAL VASCULAR CATHETERIZATION Right 10/10/2015   Procedure: Lower Extremity Intervention;  Surgeon: Algernon Huxley, MD;  Location: [facility];  Service: Cardiovascular;  Laterality: Right;  . PERIPHERAL VASCULAR CATHETERIZATION Right 12/03/2015   Procedure: Lower Extremity Angiography;  Surgeon: Algernon Huxley, MD;  Location: [facility];  Service: Cardiovascular;  Laterality: Right;  . PERIPHERAL VASCULAR CATHETERIZATION Right 12/04/2015   Procedure: Lower Extremity Angiography;  Surgeon: Algernon Huxley, MD;  Location: [facility];  Service: Cardiovascular;  Laterality: Right;  . PERIPHERAL VASCULAR CATHETERIZATION  12/04/2015   Procedure: Lower Extremity Intervention;  Surgeon: Algernon Huxley, MD;  Location: [facility];  Service: Cardiovascular;;  . PERIPHERAL VASCULAR CATHETERIZATION Right 06/30/2016   Procedure: Lower Extremity Angiography;  Surgeon: Algernon Huxley, MD;  Location: [facility];  Service: Cardiovascular;  Laterality: Right;  . PERIPHERAL VASCULAR CATHETERIZATION Right 06/25/2016   Procedure: Lower Extremity Angiography;  Surgeon: Katha Cabal, MD;  Location: [facility];  Service: Cardiovascular;  Laterality: Right;  . PERIPHERAL VASCULAR CATHETERIZATION Right 07/01/2016   Procedure: Lower Extremity Angiography;  Surgeon: Katha Cabal, MD;  Location: [facility];  Service: Cardiovascular;  Laterality: Right;  . PERIPHERAL VASCULAR  CATHETERIZATION  07/01/2016   Procedure: Lower Extremity Intervention;  Surgeon: Katha Cabal, MD;  Location: [facility];  Service: Cardiovascular;;  . stent placement in right leg Right     No family history on file.  No Known Allergies     Assessment & Plan:  Patient presents for his first post-procedure follow up. 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 His most recent was on 06/30/16-07/01/16. He continues to experiences RLE claudication. He presents today to review vascular studies. He underwent an ABI which was notable for Right ABI: 0.52 and Left 1.26 (on 11/21/15, Right ABI: 0.44 and Left 1.16). A bilateral lower extremity vein mapping was notable for Right: SSV: Occluded GSV: 0.2-0.7 / Left: SSV: 0.2 and GSV: 0.1-0.6. Denies any fever, nausea or vomiting. Denies any ulcer development to the RLE.   1. 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 In preparation for a possible bypass will bring patient back for a right lower extremity angiogram to restore blood flow to the patients RLE.  Procedure, risks and benefits explained to the patient.  All questions answered. Patient wishes to proceed.   2. Essential hypertension - Stable Encouraged good control as its slows the progression of atherosclerotic disease  3. Hyperlipidemia, unspecified hyperlipidemia type - Stable Encouraged good control as its slows the progression of atherosclerotic disease  Current Outpatient Prescriptions on File Prior to Visit  Medication Sig Dispense Refill  . acetaminophen (TYLENOL) 500 MG tablet Take 1,000 mg by mouth every 6 (six) hours as needed for mild pain (for pain.).     Marland Kitchen aspirin EC 81 MG tablet Take 81 mg by mouth daily.    Marland Kitchen ELIQUIS 5 MG TABS tablet TAKE 1 TABLET (5 MG) BY MOUTH TWICE DAILY  6  .  lisinopril (PRINIVIL,ZESTRIL) 20 MG tablet Take 20 mg by mouth daily.    . simvastatin (ZOCOR) 20 MG tablet Take 20 mg by mouth daily at 6 PM.      No current facility-administered medications on file prior to visit.     There are no Patient Instructions on file for this visit. No Follow-up on file.   Cedrica Brune A Mable Lashley, PA-C

## 2016-08-01 ENCOUNTER — Encounter
Admission: RE | Disposition: A | Discharge status: Home / Self Care | Payer: Self-pay | Source: Ambulatory Visit | Attending: Vascular Surgery

## 2016-08-01 ENCOUNTER — Ambulatory Visit
Admission: RE | Admit: 2016-08-01 | Discharge: 2016-08-01 | Disposition: A | Discharge status: Home / Self Care | Payer: BLUE CROSS/BLUE SHIELD | Source: Ambulatory Visit | Attending: Vascular Surgery | Admitting: Vascular Surgery

## 2016-08-01 DIAGNOSIS — R7303 Prediabetes: Secondary | ICD-10-CM | POA: Diagnosis not present

## 2016-08-01 DIAGNOSIS — I70228 Atherosclerosis of native arteries of extremities with rest pain, other extremity: Secondary | ICD-10-CM | POA: Diagnosis present

## 2016-08-01 DIAGNOSIS — Z87891 Personal history of nicotine dependence: Secondary | ICD-10-CM | POA: Diagnosis not present

## 2016-08-01 DIAGNOSIS — E785 Hyperlipidemia, unspecified: Secondary | ICD-10-CM | POA: Insufficient documentation

## 2016-08-01 DIAGNOSIS — I10 Essential (primary) hypertension: Secondary | ICD-10-CM | POA: Diagnosis not present

## 2016-08-01 DIAGNOSIS — Z7902 Long term (current) use of antithrombotics/antiplatelets: Secondary | ICD-10-CM | POA: Diagnosis not present

## 2016-08-01 DIAGNOSIS — Z9889 Other specified postprocedural states: Secondary | ICD-10-CM | POA: Insufficient documentation

## 2016-08-01 DIAGNOSIS — Z7982 Long term (current) use of aspirin: Secondary | ICD-10-CM | POA: Diagnosis not present

## 2016-08-01 DIAGNOSIS — I70223 Atherosclerosis of native arteries of extremities with rest pain, bilateral legs: Secondary | ICD-10-CM | POA: Diagnosis not present

## 2016-08-01 HISTORY — PX: PERIPHERAL VASCULAR CATHETERIZATION: SHX172C

## 2016-08-01 SURGERY — LOWER EXTREMITY ANGIOGRAPHY
Anesthesia: Moderate Sedation | Laterality: Right

## 2016-08-01 MED ORDER — SODIUM CHLORIDE 0.9 % IV SOLN: INTRAVENOUS | Status: DC

## 2016-08-01 MED ORDER — LIDOCAINE HCL (PF) 1 % IJ SOLN
INTRAMUSCULAR | Status: AC
  Filled 2016-08-01: qty 30

## 2016-08-01 MED ORDER — FAMOTIDINE 20 MG PO TABS: 40.0000 mg | ORAL_TABLET | ORAL | Status: DC | PRN

## 2016-08-01 MED ORDER — HEPARIN SODIUM (PORCINE) 1000 UNIT/ML IJ SOLN
INTRAMUSCULAR | Status: AC
  Filled 2016-08-01: qty 1

## 2016-08-01 MED ORDER — ONDANSETRON HCL 4 MG/2ML IJ SOLN: 4.0000 mg | Freq: Four times a day (QID) | INTRAMUSCULAR | Status: DC | PRN

## 2016-08-01 MED ORDER — ACETAMINOPHEN 325 MG PO TABS: 325.0000 mg | ORAL_TABLET | ORAL | Status: DC | PRN

## 2016-08-01 MED ORDER — HYDRALAZINE HCL 20 MG/ML IJ SOLN: 5.0000 mg | INTRAMUSCULAR | Status: DC | PRN

## 2016-08-01 MED ORDER — ALUM & MAG HYDROXIDE-SIMETH 200-200-20 MG/5ML PO SUSP: 15.0000 mL | ORAL | Status: DC | PRN

## 2016-08-01 MED ORDER — MIDAZOLAM HCL 5 MG/5ML IJ SOLN
INTRAMUSCULAR | Status: AC
  Filled 2016-08-01: qty 5

## 2016-08-01 MED ORDER — DOCUSATE SODIUM 100 MG PO CAPS: 100.0000 mg | ORAL_CAPSULE | Freq: Every day | ORAL | Status: DC

## 2016-08-01 MED ORDER — MORPHINE SULFATE (PF) 4 MG/ML IV SOLN: 2.0000 mg | INTRAVENOUS | Status: DC | PRN

## 2016-08-01 MED ORDER — PANTOPRAZOLE SODIUM 40 MG PO TBEC: 40.0000 mg | DELAYED_RELEASE_TABLET | Freq: Every day | ORAL | Status: DC

## 2016-08-01 MED ORDER — FENTANYL CITRATE (PF) 100 MCG/2ML IJ SOLN
INTRAMUSCULAR | Status: DC | PRN
Start: 2016-08-01 — End: 2016-08-01
  Administered 2016-08-01: 25 ug via INTRAVENOUS
  Administered 2016-08-01: 100 ug via INTRAVENOUS
  Administered 2016-08-01: 50 ug via INTRAVENOUS
  Administered 2016-08-01: 25 ug via INTRAVENOUS

## 2016-08-01 MED ORDER — LABETALOL HCL 5 MG/ML IV SOLN: 10.0000 mg | INTRAVENOUS | Status: DC | PRN

## 2016-08-01 MED ORDER — METOPROLOL TARTRATE 5 MG/5ML IV SOLN: 5.0000 mg | Freq: Four times a day (QID) | INTRAVENOUS | Status: DC

## 2016-08-01 MED ORDER — HYDROMORPHONE HCL 1 MG/ML IJ SOLN: 1.0000 mg | Freq: Once | INTRAMUSCULAR | Status: DC

## 2016-08-01 MED ORDER — CEFAZOLIN IN D5W 1 GM/50ML IV SOLN
1.0000 g | INTRAVENOUS | Status: AC
  Administered 2016-08-01: 1 g via INTRAVENOUS

## 2016-08-01 MED ORDER — HEPARIN (PORCINE) IN NACL 2-0.9 UNIT/ML-% IJ SOLN
INTRAMUSCULAR | Status: AC
  Filled 2016-08-01: qty 1000

## 2016-08-01 MED ORDER — MIDAZOLAM HCL 2 MG/2ML IJ SOLN
INTRAMUSCULAR | Status: DC | PRN
  Administered 2016-08-01: 2 mg via INTRAVENOUS
  Administered 2016-08-01: 1 mg via INTRAVENOUS

## 2016-08-01 MED ORDER — OXYCODONE HCL 5 MG PO TABS
5.0000 mg | ORAL_TABLET | ORAL | Status: DC | PRN
Start: 2016-08-01 — End: 2016-08-01
  Filled 2016-08-01: qty 2

## 2016-08-01 MED ORDER — IOPAMIDOL (ISOVUE-300) INJECTION 61%
INTRAVENOUS | Status: DC | PRN
  Administered 2016-08-01: 55 mL via INTRA_ARTERIAL

## 2016-08-01 MED ORDER — FENTANYL CITRATE (PF) 100 MCG/2ML IJ SOLN
INTRAMUSCULAR | Status: AC
  Filled 2016-08-01: qty 2

## 2016-08-01 MED ORDER — ACETAMINOPHEN 325 MG RE SUPP
325.0000 mg | RECTAL | Status: DC | PRN
  Filled 2016-08-01: qty 2

## 2016-08-01 MED ORDER — METHYLPREDNISOLONE SODIUM SUCC 125 MG IJ SOLR: 125.0000 mg | INTRAMUSCULAR | Status: DC | PRN

## 2016-08-01 SURGICAL SUPPLY — 12 items
CATH BERNSTEIN 5FR 130CM (CATHETERS) ×2 IMPLANT
CATH PIG 70CM (CATHETERS) ×2 IMPLANT
DEVICE CLOSURE MYNXGRIP 5F (Vascular Products) ×2 IMPLANT
DEVICE TORQUE (MISCELLANEOUS) ×4 IMPLANT
GLIDEWIRE ANGLED SS 035X260CM (WIRE) ×4 IMPLANT
NEEDLE ENTRY 21GA 7CM ECHOTIP (NEEDLE) ×2 IMPLANT
PACK ANGIOGRAPHY (CUSTOM PROCEDURE TRAY) ×2 IMPLANT
SET INTRO CAPELLA COAXIAL (SET/KITS/TRAYS/PACK) ×2 IMPLANT
SHEATH BRITE TIP 5FRX11 (SHEATH) ×2 IMPLANT
SYR MEDRAD MARK V 150ML (SYRINGE) ×2 IMPLANT
TUBING CONTRAST HIGH PRESS 72 (TUBING) ×2 IMPLANT
WIRE J 3MM .035X145CM (WIRE) ×2 IMPLANT

## 2016-08-01 NOTE — Progress Notes (Signed)
Procedure done, vitals remained stable throughout. No bleeding nor hematoma at left groin site. Will transfer to recovery for further monitoring prior to discharge.

## 2016-08-01 NOTE — Discharge Instructions (Signed)

## 2016-08-01 NOTE — Progress Notes (Signed)
Appointment made with [facility] Dr. Clayborn Bigness  08/07/2016 for pre-op clearance.

## 2016-08-01 NOTE — Progress Notes (Signed)
Patient here today with right lower extremity angiogram, vitals stable, alert and oriented. Placed on monitor in vascular lab, will continue to monitor vitals as well as etco2 during and throughout entire procedure.

## 2016-08-01 NOTE — Procedures (Signed)
[facility] History & Physical Update  The patient was interviewed and re-examined.  The patient's previous History and Physical has been reviewed and is unchanged.  There is no change in the plan of care. We plan to proceed with the scheduled procedure.  [name], MD  08/01/2016, 7:31 AM

## 2016-08-01 NOTE — Op Note (Signed)
[facility]  Percutaneous Study/Intervention Procedural Note   Date of Surgery: 08/01/2016,8:59 AM  Surgeon:Schnier, Dolores Lory   Pre-operative Diagnosis: Atherosclerotic occlusive disease bilateral lower extremities with rest pain and lifestyle limiting claudication of the right lower extremity; complication vascular device with thrombosis of SFA stent  Post-operative diagnosis:  Same  Procedure(s) Performed:  1.  Right lower extremity arteriography with distal runoff third order catheter placement  2.  Additional third order catheter placement  3.  Minx closure left common femoral    Anesthesia: Conscious sedation was administered under my direct supervision. IV Versed plus fentanyl were utilized. Continuous ECG, pulse oximetry and blood pressure was monitored throughout the entire procedure.  Conscious sedation was administered for a total of 40 minutes.  Sheath: 5 [place]  Contrast: 55 cc   Fluoroscopy Time: 6.0 minutes  Indications:  Patient has recently undergone several attempts at revascularization of the right leg all are successful initially put thrombosis in and fairly short order. This is in spite of the patient being anticoagulated. Therefore have recommended angiography for planning a bypass. The risks and benefits are reviewed all questions answered patient agrees to proceed with angiography  Procedure:  Jasier Calabretta Blackwellis a 61 y.o. male who was identified and appropriate procedural time out was performed.  The patient was then placed supine on the table and prepped and draped in the usual sterile fashion.  Ultrasound was used to evaluate the left common femoral artery.  It was patent .  A digital ultrasound image was acquired.  Amicropuncture needle was used to access the left common femoral artery under direct ultrasound guidance and a permanent image wassaved for the record.microwire was then advanced under fluoroscopic guidance followed by  micro-sheath.  A 0.035 J wire was advanced without resistance and a 5Fr sheath was placed.    Pigtail catheter was then advanced to the level of L5 and AP projection of the aorta was obtained. Pigtail catheter was then repositioned to above the bifurcation and LAO view of the pelvis was obtained. Stiff angled Glidewire and pigtail catheter was then used across the bifurcation and the catheter was positioned in the distal external iliac artery.  RAO of the right groin was then obtained. The Glidewire was reintroduced and negotiated into the SFA and the catheter was advanced into the mid popliteal. Distal runoff was then performed. This was inadequate distally and therefore the wire was reintroduced and a Kumpe catheter advanced initially the posterior tibial artery was selected wire and catheter were advanced into the posterior tibial and hand injection contrast was used to complete distal imaging all the way to the pedal arch. The Kumpe catheter and wire were then pulled back into the tibioperoneal trunk and the catheter and wire then renegotiated into the peroneal where distal runoff was obtained all the way down to the pedal arch.  After review of the images the catheter was removed over wire and an LAO view of the groin was obtained. Minx device was deployed without difficulty.  Findings:   Aortogram:  Distal aorta is free of hemodynamically significant disease  Right Lower Extremity:  The right common femoral and profunda femoris are widely patent. There are moderate collaterals from the profunda reconstituting the popliteal at the level of the femoral condyles. The previously placed SFA stents are occluded although there is approximately 1-1/2 cm at the origin which is patent and appears to be fully expanded without obstruction also reconstitution of the popliteal at the level of  femoral condyles is actually through the distal most stent. This area of approximately 4 cm of stented popliteal is widely  patent with good flow. The native below-knee popliteal are free of hemodynamically significant disease. Trifurcation is patent although the anterior tibial occludes several centimeters after it's origin. The tibioperoneal trunk is widely patent the peroneal is widely patent throughout its proximal two thirds it occludes down by the ankle and there are no significant collaterals contribute to the foot. The posterior tibial is the dominant runoff to the foot is patent through its entirety although at the level of the ankle there is moderate disease. The plantar vessels fill the pedal arch fills for the first second and third toes but there is nonfilling of the fourth and fifth rays.  Left Lower Extremity:  Left iliac system as well as the common femoral is patent   Disposition: Patient was taken to the recovery room in stable condition having tolerated the procedure well.  Belenda Cruise Schnier 08/01/2016,8:59 AM

## 2016-08-04 ENCOUNTER — Encounter: Payer: Self-pay | Admitting: Vascular Surgery

## 2016-08-06 ENCOUNTER — Encounter (INDEPENDENT_AMBULATORY_CARE_PROVIDER_SITE_OTHER): Payer: BLUE CROSS/BLUE SHIELD

## 2016-08-06 ENCOUNTER — Ambulatory Visit (INDEPENDENT_AMBULATORY_CARE_PROVIDER_SITE_OTHER): Payer: BLUE CROSS/BLUE SHIELD | Admitting: Vascular Surgery

## 2016-08-14 ENCOUNTER — Encounter (INDEPENDENT_AMBULATORY_CARE_PROVIDER_SITE_OTHER): Payer: Self-pay | Admitting: Vascular Surgery

## 2016-08-14 ENCOUNTER — Ambulatory Visit (INDEPENDENT_AMBULATORY_CARE_PROVIDER_SITE_OTHER): Payer: BLUE CROSS/BLUE SHIELD | Admitting: Vascular Surgery

## 2016-08-14 VITALS — BP 123/63 | HR 71 | Resp 16 | Ht 72.0 in | Wt 210.0 lb

## 2016-08-14 DIAGNOSIS — M79604 Pain in right leg: Secondary | ICD-10-CM | POA: Diagnosis not present

## 2016-08-14 DIAGNOSIS — I70221 Atherosclerosis of native arteries of extremities with rest pain, right leg: Secondary | ICD-10-CM

## 2016-08-14 DIAGNOSIS — E782 Mixed hyperlipidemia: Secondary | ICD-10-CM | POA: Diagnosis not present

## 2016-08-14 DIAGNOSIS — I10 Essential (primary) hypertension: Secondary | ICD-10-CM | POA: Diagnosis not present

## 2016-08-14 DIAGNOSIS — M79609 Pain in unspecified limb: Secondary | ICD-10-CM | POA: Insufficient documentation

## 2016-08-14 NOTE — Progress Notes (Signed)
MRN : [id number]  Joshua [name] is a 61 y.o. (06-12-1956) male who presents with chief complaint of  Chief Complaint  Patient presents with  . Routine Post Op    2 week post angio Follow up  .  History of Present Illness: The patient returns to the office for followup and review of the noninvasive studies. There have been no interval changes in lower extremity symptoms. He continues to have rest pain symptoms of the right leg.  He notes he tried to go t work twice but had to leave because he was unable to perform his duties. No new ulcers or wounds have occurred since the last visit.  There have been no significant changes to the patient's overall health care.  The patient denies amaurosis fugax or recent TIA symptoms. There are no recent neurological changes noted. The patient denies history of DVT, PE or superficial thrombophlebitis. The patient denies recent episodes of angina or shortness of breath.   Duplex ultrasound of the saphenous veins bilaterally shows the veins are borderline with diameters of 2.6 mm or greater.  Current Meds  Medication Sig  . acetaminophen (TYLENOL) 500 MG tablet Take 1,000 mg by mouth every 6 (six) hours as needed for mild pain (for pain.).   Marland Kitchen aspirin EC 81 MG tablet Take 81 mg by mouth daily.  Marland Kitchen ELIQUIS 5 MG TABS tablet TAKE 1 TABLET (5 MG) BY MOUTH TWICE DAILY  . hydrochlorothiazide (HYDRODIURIL) 12.5 MG tablet TAKE 1 TABLET(12.5 MG) BY MOUTH EVERY DAY  . lisinopril (PRINIVIL,ZESTRIL) 20 MG tablet Take 20 mg by mouth daily.  . simvastatin (ZOCOR) 20 MG tablet Take 20 mg by mouth daily at 6 PM.   . [DISCONTINUED] apixaban (ELIQUIS) 5 MG TABS tablet Take by mouth.    Past Medical History:  Diagnosis Date  . Dyspnea   . Hypertension   . Peripheral vascular disease ([place])   . Pre-diabetes     Past Surgical History:  Procedure Laterality Date  . BACK SURGERY  2011  . COLONOSCOPY  2013 ?  Marland Kitchen PERIPHERAL VASCULAR CATHETERIZATION Right  01/02/2015   Procedure: Lower Extremity Angiography;  Surgeon: Katha Cabal, MD;  Location: [facility];  Service: Cardiovascular;  Laterality: Right;  . PERIPHERAL VASCULAR CATHETERIZATION Right 06/18/2015   Procedure: Lower Extremity Angiography;  Surgeon: Algernon Huxley, MD;  Location: [facility];  Service: Cardiovascular;  Laterality: Right;  . PERIPHERAL VASCULAR CATHETERIZATION  06/18/2015   Procedure: Lower Extremity Intervention;  Surgeon: Algernon Huxley, MD;  Location: [facility];  Service: Cardiovascular;;  . PERIPHERAL VASCULAR CATHETERIZATION N/A 10/09/2015   Procedure: Abdominal Aortogram w/Lower Extremity;  Surgeon: Katha Cabal, MD;  Location: [facility];  Service: Cardiovascular;  Laterality: N/A;  . PERIPHERAL VASCULAR CATHETERIZATION  10/09/2015   Procedure: Lower Extremity Intervention;  Surgeon: Katha Cabal, MD;  Location: [facility];  Service: Cardiovascular;;  . PERIPHERAL VASCULAR CATHETERIZATION Right 10/10/2015   Procedure: Lower Extremity Angiography;  Surgeon: Algernon Huxley, MD;  Location: [facility];  Service: Cardiovascular;  Laterality: Right;  . PERIPHERAL VASCULAR CATHETERIZATION Right 10/10/2015   Procedure: Lower Extremity Intervention;  Surgeon: Algernon Huxley, MD;  Location: [facility];  Service: Cardiovascular;  Laterality: Right;  . PERIPHERAL VASCULAR CATHETERIZATION Right 12/03/2015   Procedure: Lower Extremity Angiography;  Surgeon: Algernon Huxley, MD;  Location: [facility];  Service: Cardiovascular;  Laterality: Right;  .  PERIPHERAL VASCULAR CATHETERIZATION Right 12/04/2015   Procedure: Lower Extremity Angiography;  Surgeon: Algernon Huxley, MD;  Location: [facility];  Service: Cardiovascular;  Laterality: Right;  . PERIPHERAL VASCULAR CATHETERIZATION  12/04/2015   Procedure: Lower Extremity Intervention;  Surgeon: Algernon Huxley, MD;  Location: [facility];  Service:  Cardiovascular;;  . PERIPHERAL VASCULAR CATHETERIZATION Right 06/30/2016   Procedure: Lower Extremity Angiography;  Surgeon: Algernon Huxley, MD;  Location: [facility];  Service: Cardiovascular;  Laterality: Right;  . PERIPHERAL VASCULAR CATHETERIZATION Right 06/25/2016   Procedure: Lower Extremity Angiography;  Surgeon: Katha Cabal, MD;  Location: [facility];  Service: Cardiovascular;  Laterality: Right;  . PERIPHERAL VASCULAR CATHETERIZATION Right 07/01/2016   Procedure: Lower Extremity Angiography;  Surgeon: Katha Cabal, MD;  Location: [facility];  Service: Cardiovascular;  Laterality: Right;  . PERIPHERAL VASCULAR CATHETERIZATION  07/01/2016   Procedure: Lower Extremity Intervention;  Surgeon: Katha Cabal, MD;  Location: [facility];  Service: Cardiovascular;;  . PERIPHERAL VASCULAR CATHETERIZATION Right 08/01/2016   Procedure: Lower Extremity Angiography;  Surgeon: Katha Cabal, MD;  Location: [facility];  Service: Cardiovascular;  Laterality: Right;  . stent placement in right leg Right     Social History Social History  Substance Use Topics  . Smoking status: Former Smoker    Packs/day: 1.50    Types: Cigarettes    Quit date: 12/20/2014  . Smokeless tobacco: Former Systems developer  . Alcohol use No    Family History No family history of bleeding/clotting disorders, porphyria or autoimmune disease   No Known Allergies   REVIEW OF SYSTEMS (Negative unless checked)  Constitutional: '[]'$ Weight loss  '[]'$ Fever  '[]'$ Chills Cardiac: '[]'$ Chest pain   '[]'$ Chest pressure   '[]'$ Palpitations   '[]'$ Shortness of breath when laying flat   '[]'$ Shortness of breath with exertion. Vascular:  '[]'$ Pain in legs with walking   '[]'$ Pain in legs at rest  '[]'$ History of DVT   '[]'$ Phlebitis   '[]'$ Swelling in legs   '[]'$ Varicose veins   '[]'$ Non-healing ulcers Pulmonary:   '[]'$ Uses home oxygen   '[]'$ Productive cough   '[]'$ Hemoptysis   '[]'$ Wheeze  '[]'$ COPD   '[]'$ Asthma Neurologic:   '[]'$ Dizziness   '[]'$ Seizures   '[]'$ History of stroke   '[]'$ History of TIA  '[]'$ Aphasia   '[]'$ Vissual changes   '[]'$ Weakness or numbness in arm   '[]'$ Weakness or numbness in leg Musculoskeletal:   '[]'$ Joint swelling   '[]'$ Joint pain   '[]'$ Low back pain Hematologic:  '[]'$ Easy bruising  '[]'$ Easy bleeding   '[]'$ Hypercoagulable state   '[]'$ Anemic Gastrointestinal:  '[]'$ Diarrhea   '[]'$ Vomiting  '[]'$ Gastroesophageal reflux/heartburn   '[]'$ Difficulty swallowing. Genitourinary:  '[]'$ Chronic kidney disease   '[]'$ Difficult urination  '[]'$ Frequent urination   '[]'$ Blood in urine Skin:  '[]'$ Rashes   '[]'$ Ulcers  Psychological:  '[]'$ History of anxiety   '[]'$  History of major depression.  Physical Examination  Vitals:   08/14/16 1007  BP: 123/63  Pulse: 71  Resp: 16  Weight: 210 lb (95.3 kg)  Height: 6' (1.829 m)   Body mass index is 28.48 kg/m. Gen: WD/WN, NAD Head: [place]/AT, No temporalis wasting.  Ear/Nose/Throat: Hearing grossly intact, nares w/o erythema or drainage, poor dentition Eyes: PER, EOMI, sclera nonicteric.  Neck: Supple, no masses.  No bruit or JVD.  Pulmonary:  Good air movement, clear to auscultation bilaterally, no use of accessory muscles.  Cardiac: RRR, normal S1, S2, no Murmurs. Vascular:  Right foot with sluggish cap refill and moderate trophic  Vessel Right Left  Radial Palpable Palpable  Ulnar Palpable Palpable  Brachial Palpable Palpable  Carotid Palpable Palpable  Femoral Palpable Palpable  Popliteal Not Palpable Palpable  PT Not Palpable Palpable  DP Not Palpable Palpable   Gastrointestinal: soft, non-distended. No guarding/no peritoneal signs.  Musculoskeletal: M/S 5/5 throughout.  No deformity or atrophy.  Neurologic: CN 2-12 intact. Pain and light touch intact in extremities.  Symmetrical.  Speech is fluent. Motor exam as listed above. Psychiatric: Judgment intact, Mood & affect appropriate for pt's clinical situation. Dermatologic: No rashes or ulcers noted.  No changes consistent with cellulitis. Lymph : No  Cervical lymphadenopathy, no lichenification or skin changes of chronic lymphedema.  CBC Lab Results  Component Value Date   WBC 5.5 07/01/2016   HGB 12.1 (L) 07/01/2016   HCT 35.5 (L) 07/01/2016   MCV 84.7 07/01/2016   PLT 136 (L) 07/01/2016    BMET    Component Value Date/Time   NA 137 07/01/2016 0418   K 3.7 07/01/2016 0418   CL 105 07/01/2016 0418   CO2 28 07/01/2016 0418   GLUCOSE 156 (H) 07/01/2016 0418   BUN 15 07/01/2016 0418   BUN 16 07/03/2014 1205   CREATININE 0.97 07/01/2016 0418   CREATININE 1.34 (H) 07/03/2014 1205   CALCIUM 8.4 (L) 07/01/2016 0418   GFRNONAA >60 07/01/2016 0418   GFRNONAA 59 (L) 07/03/2014 1205   GFRAA >60 07/01/2016 0418   GFRAA >60 07/03/2014 1205   CrCl cannot be calculated (Patient's most recent lab result is older than the maximum 21 days allowed.).  COAG Lab Results  Component Value Date   INR 1.11 06/25/2016   INR 1.90 05/12/2016   INR 1.20 12/05/2015    Radiology No results found.  Assessment/Plan 1. Atherosclerosis of native artery of right lower extremity with rest pain ([place])  Recommend:  The patient has evidence of severe atherosclerotic changes of both lower extremities associated with ulceration and tissue loss of the foot.  This represents a limb threatening ischemia and places the patient at the risk for limb loss.  Angiography has been performed and the situation is not amenable for intervention.  Given this finding open surgical repair is recommended.   Patient should undergo arterial reconstruction of the lower extremity with the hope for limb salvage.  Femoral to below knee popliteal bypass will be done.  The risks and benefits as well as the alternative therapies was discussed in detail with the patient.  All questions were answered.  Patient agrees to proceed with bypass surgery.  The patient will follow up with me in the office after the procedure.   2. Pain of right lower extremity See #1  3. Essential  hypertension Continue antihypertensive medications as already ordered, these medications have been reviewed and there are no changes at this time.  4. Mixed hyperlipidemia Continue statin as ordered and reviewed, no changes at this time   [name], MD  08/14/2016 11:37 AM

## 2016-08-18 ENCOUNTER — Telehealth (INDEPENDENT_AMBULATORY_CARE_PROVIDER_SITE_OTHER): Payer: Self-pay | Admitting: Vascular Surgery

## 2016-08-18 NOTE — Telephone Encounter (Signed)
Pt lvm and stated if Dr Clayborn Bigness had called [place] about his Stress Test and Echo he had done Friday. # [phone]

## 2016-08-20 ENCOUNTER — Encounter (INDEPENDENT_AMBULATORY_CARE_PROVIDER_SITE_OTHER): Payer: Self-pay

## 2016-08-27 ENCOUNTER — Other Ambulatory Visit (INDEPENDENT_AMBULATORY_CARE_PROVIDER_SITE_OTHER): Payer: Self-pay

## 2016-09-02 ENCOUNTER — Encounter
Admission: RE | Admit: 2016-09-02 | Discharge: 2016-09-02 | Disposition: A | Discharge status: Home / Self Care | Payer: BLUE CROSS/BLUE SHIELD | Source: Ambulatory Visit | Attending: Vascular Surgery | Admitting: Vascular Surgery

## 2016-09-02 DIAGNOSIS — I7090 Unspecified atherosclerosis: Secondary | ICD-10-CM | POA: Diagnosis not present

## 2016-09-02 DIAGNOSIS — Z0183 Encounter for blood typing: Secondary | ICD-10-CM | POA: Insufficient documentation

## 2016-09-02 DIAGNOSIS — Z01812 Encounter for preprocedural laboratory examination: Secondary | ICD-10-CM | POA: Diagnosis present

## 2016-09-02 LAB — DIFFERENTIAL
BASOS ABS: 0.1 10*3/uL (ref 0–0.1)
Basophils Relative: 1 %
Eosinophils Absolute: 0.1 10*3/uL (ref 0–0.7)
Eosinophils Relative: 2 %
Lymphocytes Relative: 24 %
Lymphs Abs: 1.5 10*3/uL (ref 1.0–3.6)
Monocytes Absolute: 0.6 10*3/uL (ref 0.2–1.0)
Monocytes Relative: 10 %
NEUTROS ABS: 3.8 10*3/uL (ref 1.4–6.5)
NEUTROS PCT: 63 %

## 2016-09-02 LAB — CBC
HCT: 40.5 % (ref 40.0–52.0)
Hemoglobin: 13.8 g/dL (ref 13.0–18.0)
MCH: 28.7 pg (ref 26.0–34.0)
MCHC: 34 g/dL (ref 32.0–36.0)
MCV: 84.3 fL (ref 80.0–100.0)
PLATELETS: 162 10*3/uL (ref 150–440)
RBC: 4.81 MIL/uL (ref 4.40–5.90)
RDW: 13.7 % (ref 11.5–14.5)
WBC: 6.2 10*3/uL (ref 3.8–10.6)

## 2016-09-02 LAB — SURGICAL PCR SCREEN
MRSA, PCR: NEGATIVE
Staphylococcus aureus: NEGATIVE

## 2016-09-02 LAB — BASIC METABOLIC PANEL
ANION GAP: 7 (ref 5–15)
BUN: 17 mg/dL (ref 6–20)
CO2: 28 mmol/L (ref 22–32)
Calcium: 9.2 mg/dL (ref 8.9–10.3)
Chloride: 103 mmol/L (ref 101–111)
Creatinine, Ser: 1.12 mg/dL (ref 0.61–1.24)
Glucose, Bld: 84 mg/dL (ref 65–99)
POTASSIUM: 4.3 mmol/L (ref 3.5–5.1)
SODIUM: 138 mmol/L (ref 135–145)

## 2016-09-02 NOTE — Patient Instructions (Signed)
  Your procedure is scheduled on: Wednesday Feb. 21 , 2018. Report to Same Day Surgery. To find out your arrival time please call [phone] between 1PM - 3PM on Tuesday Feb. 20, 2018.  Remember: Instructions that are not followed completely may result in serious medical risk, up to and including death, or upon the discretion of your surgeon and anesthesiologist your surgery may need to be rescheduled.    _x___ 1. Do not eat food or drink liquids after midnight. No gum chewing or hard candies.     ____ 2. No Alcohol for 24 hours before or after surgery.   ____ 3. Bring all medications with you on the day of surgery if instructed.    __x__ 4. Notify your doctor if there is any change in your medical condition     (cold, fever, infections).    _____ 5. No smoking 24 hours prior to surgery.     Do not wear jewelry, make-up, hairpins, clips or nail polish.  Do not wear lotions, powders, or perfumes.   Do not shave 48 hours prior to surgery. Men may shave face and neck.  Do not bring valuables to the hospital.    [facility] is not responsible for any belongings or valuables.               Contacts, dentures or bridgework may not be worn into surgery.  Leave your suitcase in the car. After surgery it may be brought to your room.  For patients admitted to the hospital, discharge time is determined by your treatment team.   Patients discharged the day of surgery will not be allowed to drive home.    Please read over the following fact sheets that you were given:   [facility] Preparing for Surgery  _x___ Take these medicines the morning of surgery with A SIP OF WATER:    1. lisinopril (PRINIVIL,ZESTRIL)   ____ Fleet Enema (as directed)   _x___ Use CHG Soap as directed on instruction sheet  ____ Use inhalers on the day of surgery and bring to hospital day of surgery  ____ Stop metformin 2 days prior to surgery    ____ Take 1/2 of usual insulin dose the night before surgery and  none on the morning of surgery.   __x__ Stop Eliquis as instructed by Dr. Delana Meyer.  __x__ Stop Anti-inflammatories such as Advil, Aleve, Ibuprofen, Motrin, Naproxen, Naprosyn, Goodies powders or aspirin products. OK to take Tylenol.   ____ Stop supplements until after surgery.    ____ Bring C-Pap to the hospital.

## 2016-09-10 ENCOUNTER — Encounter: Payer: Self-pay | Admitting: *Deleted

## 2016-09-10 ENCOUNTER — Inpatient Hospital Stay
Admission: RE | Admit: 2016-09-10 | Discharge: 2016-09-14 | DRG: 254 | Disposition: A | Discharge status: Home Health | Payer: BLUE CROSS/BLUE SHIELD | Source: Ambulatory Visit | Attending: Vascular Surgery | Admitting: Vascular Surgery

## 2016-09-10 ENCOUNTER — Encounter
Admission: RE | Disposition: A | Discharge status: Home Health | Payer: Self-pay | Source: Ambulatory Visit | Attending: Vascular Surgery

## 2016-09-10 ENCOUNTER — Inpatient Hospital Stay: Payer: BLUE CROSS/BLUE SHIELD | Admitting: Anesthesiology

## 2016-09-10 DIAGNOSIS — Z87891 Personal history of nicotine dependence: Secondary | ICD-10-CM

## 2016-09-10 DIAGNOSIS — I70229 Atherosclerosis of native arteries of extremities with rest pain, unspecified extremity: Secondary | ICD-10-CM | POA: Diagnosis present

## 2016-09-10 DIAGNOSIS — E782 Mixed hyperlipidemia: Secondary | ICD-10-CM | POA: Diagnosis present

## 2016-09-10 DIAGNOSIS — I70221 Atherosclerosis of native arteries of extremities with rest pain, right leg: Secondary | ICD-10-CM | POA: Diagnosis present

## 2016-09-10 DIAGNOSIS — Z23 Encounter for immunization: Secondary | ICD-10-CM

## 2016-09-10 DIAGNOSIS — I10 Essential (primary) hypertension: Secondary | ICD-10-CM | POA: Diagnosis present

## 2016-09-10 DIAGNOSIS — M79604 Pain in right leg: Secondary | ICD-10-CM | POA: Diagnosis present

## 2016-09-10 HISTORY — PX: CENTRAL LINE INSERTION: CATH118232

## 2016-09-10 HISTORY — PX: FEMORAL-POPLITEAL BYPASS GRAFT: SHX937

## 2016-09-10 LAB — MRSA PCR SCREENING: MRSA by PCR: NEGATIVE

## 2016-09-10 LAB — ABO/RH: ABO/RH(D): A POS

## 2016-09-10 LAB — GLUCOSE, CAPILLARY: GLUCOSE-CAPILLARY: 124 mg/dL — AB (ref 65–99)

## 2016-09-10 SURGERY — BYPASS GRAFT FEMORAL-POPLITEAL ARTERY
Anesthesia: General | Laterality: Right | Wound class: Clean

## 2016-09-10 MED ORDER — ALUM & MAG HYDROXIDE-SIMETH 200-200-20 MG/5ML PO SUSP: 15.0000 mL | ORAL | Status: DC | PRN

## 2016-09-10 MED ORDER — HYDROCHLOROTHIAZIDE 25 MG PO TABS
12.5000 mg | ORAL_TABLET | Freq: Every day | ORAL | Status: DC
  Administered 2016-09-10 – 2016-09-13 (×3): 12.5 mg via ORAL
  Filled 2016-09-10 (×4): qty 1

## 2016-09-10 MED ORDER — PROPOFOL 10 MG/ML IV BOLUS
INTRAVENOUS | Status: AC
  Filled 2016-09-10: qty 20

## 2016-09-10 MED ORDER — SORBITOL 70 % SOLN
30.0000 mL | Freq: Every day | Status: DC | PRN
  Administered 2016-09-12: 30 mL via ORAL
  Filled 2016-09-10 (×2): qty 30

## 2016-09-10 MED ORDER — SODIUM CHLORIDE 0.9 % IV SOLN: 500.0000 mL | Freq: Once | INTRAVENOUS | Status: DC | PRN

## 2016-09-10 MED ORDER — FENTANYL CITRATE (PF) 100 MCG/2ML IJ SOLN
INTRAMUSCULAR | Status: DC | PRN
  Administered 2016-09-10: 100 ug via INTRAVENOUS
  Administered 2016-09-10: 50 ug via INTRAVENOUS
  Administered 2016-09-10: 100 ug via INTRAVENOUS

## 2016-09-10 MED ORDER — NITROGLYCERIN IN D5W 200-5 MCG/ML-% IV SOLN: 5.0000 ug/min | INTRAVENOUS | Status: DC

## 2016-09-10 MED ORDER — ROCURONIUM BROMIDE 50 MG/5ML IV SOLN
INTRAVENOUS | Status: AC
  Filled 2016-09-10: qty 1

## 2016-09-10 MED ORDER — EPHEDRINE SULFATE 50 MG/ML IJ SOLN
INTRAMUSCULAR | Status: AC
  Filled 2016-09-10: qty 1

## 2016-09-10 MED ORDER — DOPAMINE-DEXTROSE 3.2-5 MG/ML-% IV SOLN: 3.0000 ug/kg/min | INTRAVENOUS | Status: DC

## 2016-09-10 MED ORDER — ONDANSETRON HCL 4 MG/2ML IJ SOLN: 4.0000 mg | Freq: Four times a day (QID) | INTRAMUSCULAR | Status: DC | PRN

## 2016-09-10 MED ORDER — MIDAZOLAM HCL 2 MG/2ML IJ SOLN
INTRAMUSCULAR | Status: AC
  Filled 2016-09-10: qty 2

## 2016-09-10 MED ORDER — LABETALOL HCL 5 MG/ML IV SOLN
10.0000 mg | INTRAVENOUS | Status: DC | PRN
  Filled 2016-09-10: qty 4

## 2016-09-10 MED ORDER — ASPIRIN EC 81 MG PO TBEC
81.0000 mg | DELAYED_RELEASE_TABLET | Freq: Every day | ORAL | Status: DC
  Administered 2016-09-11 – 2016-09-13 (×3): 81 mg via ORAL
  Filled 2016-09-10 (×3): qty 1

## 2016-09-10 MED ORDER — METOPROLOL TARTRATE 5 MG/5ML IV SOLN
2.0000 mg | INTRAVENOUS | Status: DC | PRN
Start: 2016-09-10 — End: 2016-09-14
  Filled 2016-09-10: qty 5

## 2016-09-10 MED ORDER — SENNOSIDES-DOCUSATE SODIUM 8.6-50 MG PO TABS: 1.0000 | ORAL_TABLET | Freq: Every evening | ORAL | Status: DC | PRN

## 2016-09-10 MED ORDER — CEFAZOLIN IN D5W 1 GM/50ML IV SOLN
INTRAVENOUS | Status: AC
  Filled 2016-09-10: qty 50

## 2016-09-10 MED ORDER — FAMOTIDINE 20 MG PO TABS
ORAL_TABLET | ORAL | Status: AC
  Administered 2016-09-10: 20 mg via ORAL
  Filled 2016-09-10: qty 1

## 2016-09-10 MED ORDER — SUGAMMADEX SODIUM 200 MG/2ML IV SOLN
INTRAVENOUS | Status: AC
  Filled 2016-09-10: qty 2

## 2016-09-10 MED ORDER — SIMVASTATIN 20 MG PO TABS
20.0000 mg | ORAL_TABLET | Freq: Every day | ORAL | Status: DC
  Administered 2016-09-11 – 2016-09-13 (×3): 20 mg via ORAL
  Filled 2016-09-10 (×3): qty 1

## 2016-09-10 MED ORDER — MAGNESIUM SULFATE 2 GM/50ML IV SOLN
2.0000 g | Freq: Every day | INTRAVENOUS | Status: DC | PRN
  Filled 2016-09-10: qty 50

## 2016-09-10 MED ORDER — HYDRALAZINE HCL 20 MG/ML IJ SOLN: 5.0000 mg | INTRAMUSCULAR | Status: DC | PRN

## 2016-09-10 MED ORDER — SUCCINYLCHOLINE CHLORIDE 20 MG/ML IJ SOLN
INTRAMUSCULAR | Status: AC
  Filled 2016-09-10: qty 1

## 2016-09-10 MED ORDER — SODIUM CHLORIDE 0.9 % IV SOLN
INTRAVENOUS | Status: DC
  Administered 2016-09-11: 11:00:00 via INTRAVENOUS

## 2016-09-10 MED ORDER — ONDANSETRON HCL 4 MG/2ML IJ SOLN
INTRAMUSCULAR | Status: AC
  Filled 2016-09-10: qty 2

## 2016-09-10 MED ORDER — HEPARIN SODIUM (PORCINE) 1000 UNIT/ML IJ SOLN
INTRAMUSCULAR | Status: DC | PRN
  Administered 2016-09-10: 5 mL via INTRAVENOUS

## 2016-09-10 MED ORDER — ACETAMINOPHEN 650 MG RE SUPP: 325.0000 mg | RECTAL | Status: DC | PRN

## 2016-09-10 MED ORDER — MIDAZOLAM HCL 2 MG/2ML IJ SOLN
INTRAMUSCULAR | Status: DC | PRN
  Administered 2016-09-10: 2 mg via INTRAVENOUS

## 2016-09-10 MED ORDER — DOCUSATE SODIUM 100 MG PO CAPS
100.0000 mg | ORAL_CAPSULE | Freq: Every day | ORAL | Status: DC
  Administered 2016-09-11 – 2016-09-13 (×3): 100 mg via ORAL
  Filled 2016-09-10 (×3): qty 1

## 2016-09-10 MED ORDER — FENTANYL CITRATE (PF) 100 MCG/2ML IJ SOLN
25.0000 ug | INTRAMUSCULAR | Status: DC | PRN
  Administered 2016-09-10 (×4): 25 ug via INTRAVENOUS

## 2016-09-10 MED ORDER — GUAIFENESIN-DM 100-10 MG/5ML PO SYRP: 15.0000 mL | ORAL_SOLUTION | ORAL | Status: DC | PRN

## 2016-09-10 MED ORDER — ROCURONIUM BROMIDE 100 MG/10ML IV SOLN
INTRAVENOUS | Status: DC | PRN
  Administered 2016-09-10: 20 mg via INTRAVENOUS
  Administered 2016-09-10: 40 mg via INTRAVENOUS
  Administered 2016-09-10 (×3): 10 mg via INTRAVENOUS

## 2016-09-10 MED ORDER — SODIUM CHLORIDE 0.9 % IV SOLN
INTRAVENOUS | Status: DC
  Administered 2016-09-10 (×2): via INTRAVENOUS

## 2016-09-10 MED ORDER — CEFAZOLIN SODIUM 1 G IJ SOLR
INTRAMUSCULAR | Status: AC
  Filled 2016-09-10: qty 10

## 2016-09-10 MED ORDER — HEPARIN SOD (PORK) LOCK FLUSH 10 UNIT/ML IV SOLN
INTRAVENOUS | Status: DC | PRN
  Administered 2016-09-10: 10 [IU]

## 2016-09-10 MED ORDER — PHENOL 1.4 % MT LIQD
1.0000 | OROMUCOSAL | Status: DC | PRN
  Filled 2016-09-10: qty 177

## 2016-09-10 MED ORDER — ONDANSETRON HCL 4 MG/2ML IJ SOLN: 4.0000 mg | Freq: Once | INTRAMUSCULAR | Status: DC | PRN

## 2016-09-10 MED ORDER — MORPHINE SULFATE (PF) 4 MG/ML IV SOLN: 2.0000 mg | INTRAVENOUS | Status: DC | PRN

## 2016-09-10 MED ORDER — DEXAMETHASONE SODIUM PHOSPHATE 10 MG/ML IJ SOLN
INTRAMUSCULAR | Status: AC
  Filled 2016-09-10: qty 1

## 2016-09-10 MED ORDER — FENTANYL CITRATE (PF) 250 MCG/5ML IJ SOLN
INTRAMUSCULAR | Status: AC
  Filled 2016-09-10: qty 5

## 2016-09-10 MED ORDER — SODIUM CHLORIDE FLUSH 0.9 % IV SOLN
INTRAVENOUS | Status: AC
  Filled 2016-09-10: qty 10

## 2016-09-10 MED ORDER — SUCCINYLCHOLINE CHLORIDE 20 MG/ML IJ SOLN
INTRAMUSCULAR | Status: DC | PRN
  Administered 2016-09-10: 120 mg via INTRAVENOUS

## 2016-09-10 MED ORDER — FAMOTIDINE 20 MG PO TABS
20.0000 mg | ORAL_TABLET | Freq: Once | ORAL | Status: AC
  Administered 2016-09-10: 20 mg via ORAL

## 2016-09-10 MED ORDER — OXYCODONE-ACETAMINOPHEN 5-325 MG PO TABS
1.0000 | ORAL_TABLET | ORAL | Status: DC | PRN
  Administered 2016-09-10: 2 via ORAL
  Filled 2016-09-10: qty 2

## 2016-09-10 MED ORDER — EPHEDRINE SULFATE 50 MG/ML IJ SOLN
INTRAMUSCULAR | Status: DC | PRN
  Administered 2016-09-10: 10 mg via INTRAVENOUS

## 2016-09-10 MED ORDER — EVICEL 5 ML EX KIT
PACK | CUTANEOUS | Status: DC | PRN
Start: 2016-09-10 — End: 2016-09-10
  Administered 2016-09-10 (×2): 5 mL

## 2016-09-10 MED ORDER — POTASSIUM CHLORIDE CRYS ER 20 MEQ PO TBCR: 20.0000 meq | EXTENDED_RELEASE_TABLET | Freq: Every day | ORAL | Status: DC | PRN

## 2016-09-10 MED ORDER — ACETAMINOPHEN 325 MG PO TABS: 325.0000 mg | ORAL_TABLET | ORAL | Status: DC | PRN

## 2016-09-10 MED ORDER — HEPARIN SODIUM (PORCINE) 1000 UNIT/ML IJ SOLN
INTRAMUSCULAR | Status: AC
  Filled 2016-09-10: qty 1

## 2016-09-10 MED ORDER — CEFAZOLIN IN D5W 1 GM/50ML IV SOLN
1.0000 g | Freq: Once | INTRAVENOUS | Status: AC
  Administered 2016-09-10 (×2): 1 g via INTRAVENOUS

## 2016-09-10 MED ORDER — SODIUM CHLORIDE 0.9 % IV SOLN
INTRAVENOUS | Status: DC | PRN
  Administered 2016-09-10: 40 ug/min via INTRAVENOUS

## 2016-09-10 MED ORDER — LIDOCAINE HCL (PF) 2 % IJ SOLN
INTRAMUSCULAR | Status: AC
Start: 2016-09-10 — End: 2016-09-10
  Filled 2016-09-10: qty 2

## 2016-09-10 MED ORDER — CEFAZOLIN IN D5W 1 GM/50ML IV SOLN
1.0000 g | Freq: Three times a day (TID) | INTRAVENOUS | Status: AC
  Administered 2016-09-10 – 2016-09-11 (×3): 1 g via INTRAVENOUS
  Filled 2016-09-10 (×3): qty 50

## 2016-09-10 MED ORDER — APIXABAN 5 MG PO TABS
5.0000 mg | ORAL_TABLET | Freq: Two times a day (BID) | ORAL | Status: DC
  Administered 2016-09-11 – 2016-09-13 (×6): 5 mg via ORAL
  Filled 2016-09-10 (×6): qty 1

## 2016-09-10 MED ORDER — LIDOCAINE HCL (CARDIAC) 20 MG/ML IV SOLN
INTRAVENOUS | Status: DC | PRN
  Administered 2016-09-10: 100 mg via INTRAVENOUS

## 2016-09-10 MED ORDER — DEXAMETHASONE SODIUM PHOSPHATE 10 MG/ML IJ SOLN
INTRAMUSCULAR | Status: DC | PRN
  Administered 2016-09-10: 10 mg via INTRAVENOUS

## 2016-09-10 MED ORDER — FENTANYL CITRATE (PF) 100 MCG/2ML IJ SOLN
INTRAMUSCULAR | Status: AC
  Administered 2016-09-10: 25 ug via INTRAVENOUS
  Filled 2016-09-10: qty 2

## 2016-09-10 MED ORDER — HEPARIN SODIUM (PORCINE) 5000 UNIT/ML IJ SOLN
INTRAMUSCULAR | Status: AC
  Filled 2016-09-10: qty 1

## 2016-09-10 MED ORDER — FAMOTIDINE IN NACL 20-0.9 MG/50ML-% IV SOLN
20.0000 mg | Freq: Two times a day (BID) | INTRAVENOUS | Status: DC
  Administered 2016-09-10 – 2016-09-13 (×7): 20 mg via INTRAVENOUS
  Filled 2016-09-10 (×10): qty 50

## 2016-09-10 MED ORDER — ONDANSETRON HCL 4 MG/2ML IJ SOLN
INTRAMUSCULAR | Status: DC | PRN
  Administered 2016-09-10: 4 mg via INTRAVENOUS

## 2016-09-10 MED ORDER — PROPOFOL 10 MG/ML IV BOLUS
INTRAVENOUS | Status: DC | PRN
  Administered 2016-09-10: 200 mg via INTRAVENOUS

## 2016-09-10 MED ORDER — LISINOPRIL 20 MG PO TABS
20.0000 mg | ORAL_TABLET | Freq: Every day | ORAL | Status: DC
  Administered 2016-09-12 – 2016-09-13 (×2): 20 mg via ORAL
  Filled 2016-09-10 (×3): qty 1

## 2016-09-10 MED ORDER — HEMOSTATIC AGENTS (NO CHARGE) OPTIME
TOPICAL | Status: DC | PRN
  Administered 2016-09-10: 1 via TOPICAL

## 2016-09-10 MED ORDER — SUGAMMADEX SODIUM 200 MG/2ML IV SOLN
INTRAVENOUS | Status: DC | PRN
  Administered 2016-09-10: 200 mg via INTRAVENOUS

## 2016-09-10 MED ORDER — PHENYLEPHRINE HCL 10 MG/ML IJ SOLN
INTRAMUSCULAR | Status: AC
  Filled 2016-09-10: qty 1

## 2016-09-10 SURGICAL SUPPLY — 88 items
APPLIER CLIP 11 MED OPEN (CLIP) ×2
APPLIER CLIP 9.375 SM OPEN (CLIP)
BAG COUNTER SPONGE EZ (MISCELLANEOUS) ×2 IMPLANT
BAG DECANTER FOR FLEXI CONT (MISCELLANEOUS) ×2 IMPLANT
BAG ISOLATATION DRAPE 20X20 ST (DRAPES) ×1 IMPLANT
BLADE SURG 15 STRL LF DISP TIS (BLADE) ×1 IMPLANT
BLADE SURG 15 STRL SS (BLADE) ×1
BLADE SURG SZ11 CARB STEEL (BLADE) ×2 IMPLANT
BOOT SUTURE AID YELLOW STND (SUTURE) ×2 IMPLANT
BRUSH SCRUB 4% CHG (MISCELLANEOUS) ×2 IMPLANT
CANISTER SUCT 1200ML W/VALVE (MISCELLANEOUS) ×2 IMPLANT
CATH TRAY 16F METER LATEX (MISCELLANEOUS) ×2 IMPLANT
CLIP APPLIE 11 MED OPEN (CLIP) ×1 IMPLANT
CLIP APPLIE 9.375 SM OPEN (CLIP) IMPLANT
CLIP SPRNG 6MM S-JAW DBL (CLIP) ×2
DERMABOND ADVANCED (GAUZE/BANDAGES/DRESSINGS) ×2
DERMABOND ADVANCED .7 DNX12 (GAUZE/BANDAGES/DRESSINGS) ×2 IMPLANT
DRAPE INCISE IOBAN 66X45 STRL (DRAPES) ×4 IMPLANT
DRAPE ISOLATE BAG 20X20 STRL (DRAPES) ×1
DRAPE SHEET LG 3/4 BI-LAMINATE (DRAPES) ×2 IMPLANT
DRESSING SURGICEL FIBRLLR 1X2 (HEMOSTASIS) ×2 IMPLANT
DRSG OPSITE POSTOP 4X10 (GAUZE/BANDAGES/DRESSINGS) ×2 IMPLANT
DRSG OPSITE POSTOP 4X14 (GAUZE/BANDAGES/DRESSINGS) ×2 IMPLANT
DRSG OPSITE POSTOP 4X8 (GAUZE/BANDAGES/DRESSINGS) ×2 IMPLANT
DRSG SURGICEL FIBRILLAR 1X2 (HEMOSTASIS) ×4
DRSG TEGADERM 2-3/8X2-3/4 SM (GAUZE/BANDAGES/DRESSINGS) ×2 IMPLANT
DRSG TEGADERM 4X10 (GAUZE/BANDAGES/DRESSINGS) IMPLANT
DRSG TEGADERM 4X4.75 (GAUZE/BANDAGES/DRESSINGS) IMPLANT
DRSG TELFA 3X8 NADH (GAUZE/BANDAGES/DRESSINGS) ×4 IMPLANT
DURAPREP 26ML APPLICATOR (WOUND CARE) ×4 IMPLANT
ELECT CAUTERY BLADE 6.4 (BLADE) ×2 IMPLANT
ELECT REM PT RETURN 9FT ADLT (ELECTROSURGICAL) ×2
ELECTRODE REM PT RTRN 9FT ADLT (ELECTROSURGICAL) ×1 IMPLANT
GEL ULTRASOUND 20GR AQUASONIC (MISCELLANEOUS) IMPLANT
GLOVE BIO SURGEON STRL SZ7 (GLOVE) ×6 IMPLANT
GLOVE INDICATOR 7.5 STRL GRN (GLOVE) ×6 IMPLANT
GLOVE SURG SYN 8.0 (GLOVE) ×4 IMPLANT
GOWN STRL REUS W/ TWL LRG LVL3 (GOWN DISPOSABLE) ×2 IMPLANT
GOWN STRL REUS W/ TWL XL LVL3 (GOWN DISPOSABLE) ×2 IMPLANT
GOWN STRL REUS W/TWL LRG LVL3 (GOWN DISPOSABLE) ×2
GOWN STRL REUS W/TWL XL LVL3 (GOWN DISPOSABLE) ×2
HEAD CUTTING 'VALVULOTOME URSL (MISCELLANEOUS) ×2 IMPLANT
IV NS 500ML (IV SOLUTION) ×1
IV NS 500ML BAXH (IV SOLUTION) ×1 IMPLANT
KIT CATH CVC 3 LUMEN 7FR 8IN (MISCELLANEOUS) ×2 IMPLANT
KIT RM TURNOVER STRD PROC AR (KITS) ×2 IMPLANT
LABEL OR SOLS (LABEL) ×2 IMPLANT
LOOP RED MAXI  1X406MM (MISCELLANEOUS) ×3
LOOP VESSEL MAXI 1X406 RED (MISCELLANEOUS) ×3 IMPLANT
LOOP VESSEL MINI 0.8X406 BLUE (MISCELLANEOUS) ×2 IMPLANT
LOOPS BLUE MINI 0.8X406MM (MISCELLANEOUS) ×2
NEEDLE FILTER BLUNT 18X 1/2SAF (NEEDLE) ×1
NEEDLE FILTER BLUNT 18X1 1/2 (NEEDLE) ×1 IMPLANT
NS IRRIG 1000ML POUR BTL (IV SOLUTION) ×2 IMPLANT
PACK BASIN MAJOR ARMC (MISCELLANEOUS) ×2 IMPLANT
PACK UNIVERSAL (MISCELLANEOUS) ×2 IMPLANT
PAD PREP 24X41 OB/GYN DISP (PERSONAL CARE ITEMS) IMPLANT
PENCIL ELECTRO HAND CTR (MISCELLANEOUS) ×2 IMPLANT
SOLUTION CELL SAVER (CLIP) ×1 IMPLANT
SPONGE LAP 18X18 5 PK (GAUZE/BANDAGES/DRESSINGS) ×2 IMPLANT
SPONGE XRAY 4X4 16PLY STRL (MISCELLANEOUS) IMPLANT
STAPLER SKIN PROX 35W (STAPLE) ×4 IMPLANT
SUT ETHIBOND CT1 BRD #0 30IN (SUTURE) IMPLANT
SUT MNCRL+ 5-0 UNDYED PC-3 (SUTURE) IMPLANT
SUT MONOCRYL 5-0 (SUTURE)
SUT PROLENE 2 0 FS (SUTURE) ×2 IMPLANT
SUT PROLENE 3 0 SH DA (SUTURE) IMPLANT
SUT PROLENE 5 0 RB 1 DA (SUTURE) IMPLANT
SUT PROLENE 6 0 BV (SUTURE) ×22 IMPLANT
SUT PROLENE 7 0 BV 1 (SUTURE) IMPLANT
SUT SILK 2 0 (SUTURE) ×1
SUT SILK 2 0 SH (SUTURE) IMPLANT
SUT SILK 2-0 18XBRD TIE 12 (SUTURE) ×1 IMPLANT
SUT SILK 3 0 (SUTURE) ×3
SUT SILK 3-0 18XBRD TIE 12 (SUTURE) ×3 IMPLANT
SUT SILK 4 0 (SUTURE) ×1
SUT SILK 4-0 18XBRD TIE 12 (SUTURE) ×1 IMPLANT
SUT VIC AB 2-0 CT1 (SUTURE) ×6 IMPLANT
SUT VIC AB 2-0 SH 27 (SUTURE)
SUT VIC AB 2-0 SH 27XBRD (SUTURE) IMPLANT
SUT VIC AB 3-0 SH 27 (SUTURE) ×1
SUT VIC AB 3-0 SH 27X BRD (SUTURE) ×1 IMPLANT
SUT VICRYL+ 3-0 36IN CT-1 (SUTURE) ×2 IMPLANT
SYR 20CC LL (SYRINGE) ×2 IMPLANT
SYR 3ML LL SCALE MARK (SYRINGE) ×2 IMPLANT
TAPE UMBIL 1/8X18 RADIOPA (MISCELLANEOUS) IMPLANT
TOWEL OR 17X26 4PK STRL BLUE (TOWEL DISPOSABLE) ×2 IMPLANT
VALVULOTOME URESIL (MISCELLANEOUS) ×4

## 2016-09-10 NOTE — Transfer of Care (Signed)
Immediate Anesthesia Transfer of Care Note  Patient: Joshua [name]  Procedure(s) Performed: Procedure(s): BYPASS GRAFT FEMORAL-POPLITEAL ARTERY ( BELOW KNEE ) (Right) CENTRAL LINE INSERTION (Right)  Patient Location: PACU  Anesthesia Type:General  Level of Consciousness: awake, alert  and oriented  Airway & Oxygen Therapy: Patient Spontanous Breathing and Patient connected to face mask oxygen  Post-op Assessment: Report given to RN and Post -op Vital signs reviewed and stable  Post vital signs: Reviewed and stable  Last Vitals:  Vitals:   09/10/16 0952 09/10/16 1759  BP: (!) 111/58 128/64  Pulse: (!) 59 93  Resp: 16 12  Temp: 36.8 C 36.8 C    Last Pain:  Vitals:   09/10/16 0952  TempSrc: Oral  PainSc: 5       Patients Stated Pain Goal: 2 (14/97/02 6378)  Complications: No apparent anesthesia complications

## 2016-09-10 NOTE — Op Note (Signed)
OPERATIVE NOTE   PROCEDURE: 1. Insertion of triple-lumen central venous catheter right IJ approach.  PRE-OPERATIVE DIAGNOSIS: atherosclerosis with rest pain right leg  POST-OPERATIVE DIAGNOSIS: same  SURGEON: Katha Cabal M.D.  ANESTHESIA: 1% lidocaine local infiltration  ESTIMATED BLOOD LOSS: Minimal cc  INDICATIONS:   Joshua [name] is a 61 y.o. male who presents with atherosclerotic occlusive disease with rest pain of the right lower extremity. He is status post multiple interventions which have failed. He is therefore undergoing surgical bypass. The risks and benefits of been reviewed all questions were answered alternative therapies have been discussed and he wishes to proceed with surgery for bypass.  DESCRIPTION: After obtaining full informed written consent, the patient was positioned supine. The right neck was prepped and draped in a sterile fashion. Ultrasound was placed in a sterile sleeve. Ultrasound was utilized to identify the right internal jugular vein which is noted to be echolucent and compressible indicating patency. Images recorded for the permanent record. Under real-time visualization a Seldinger needle is inserted into the vein and the guidewires advanced without difficulty. Small counterincision was made at the wire insertion site. Dilators passed over the wire and the triple-lumen catheter is fed without difficulty.  All 3 lm aspirate and flush easily and are packed with heparin saline. Catheter secured to the skin of the right neck with 2-0 silk. A sterile dressing is applied with Biopatch.  COMPLICATIONS: None  CONDITION: Good  Katha Cabal, M.D. [facility]. Office:  [phone]   09/10/2016, 5:49 PM

## 2016-09-10 NOTE — Anesthesia Post-op Follow-up Note (Cosign Needed)
Anesthesia QCDR form completed.        

## 2016-09-10 NOTE — Anesthesia Procedure Notes (Signed)
Procedure Name: Intubation Date/Time: 09/10/2016 2:19 PM Performed by: Nelda Marseille Pre-anesthesia Checklist: Patient identified, Patient being monitored, Timeout performed, Emergency Drugs available and Suction available Patient Re-evaluated:Patient Re-evaluated prior to inductionOxygen Delivery Method: Circle system utilized Preoxygenation: Pre-oxygenation with 100% oxygen Intubation Type: IV induction Ventilation: Mask ventilation without difficulty Laryngoscope Size: Mac and 3 Grade View: Grade I Tube type: Oral Tube size: 7.5 mm Number of attempts: 1 Airway Equipment and Method: Stylet Placement Confirmation: ETT inserted through vocal cords under direct vision,  positive ETCO2 and breath sounds checked- equal and bilateral Secured at: 22 cm Tube secured with: Tape Dental Injury: Teeth and Oropharynx as per pre-operative assessment

## 2016-09-10 NOTE — Op Note (Signed)
[facility]    OPERATIVE NOTE   PROCEDURE: 1. Right common femoral artery to below knee popliteal artery bypass with in situ great saphenous vein 2. Endarterectomy of the common femoral, profunda femoris and superficial femoral arteries 3. Removal of a Viabahn stent right SFA  PRE-OPERATIVE DIAGNOSIS: Atherosclerotic Occlusive Disease with rest pain  POST-OPERATIVE DIAGNOSIS:  Atherosclerotic Occlusive Disease with rest pain  Co-SURGEONS: Katha Cabal, M.D. And Algernon Huxley, M.D.  ASSISTANT(S): Mr Nuala Alpha  ANESTHESIA: General by Endotracheal Intubation  ESTIMATED BLOOD LOSS: 100 cc  FINDING(S): None   SPECIMEN(S):  Plaque from the right common femoral, superficial femoral and profunda femoris arteries in association with a Viabahn stent removed from the superficial femoral artery  INDICATIONS:   Joshua [name] is a 61 y.o. male who presents with rest pain of the right lower extremity. The risk, benefits, and alternative for bypass operations were discussed with the patient.  The patient is aware the risks include but are not limited to: bleeding, infection, myocardial infarction, stroke, limb loss, nerve damage, need for additional procedures in the future, wound complications, and inability to complete the bypass.  The patient is voices understanding of these risks and agreed to proceed.  DESCRIPTION: After informed consent was obtained, the patient was brought back to the operating room and placed in the supine position.  Prior to induction, the patient was given intravenous antibiotics.  After general anesthesia is induced, the patient was prepped and draped in the standard fashion for a femoral to popliteal bypass operation.  Appropriate timeout is called.    With Dr. Lucky Cowboy working on the patient's left side and myself working on the patient's right side attention was turned to the right groin.  A longitudinal incision was made over the right common  femoral artery.  Using blunt dissection and electrocautery, the artery was dissected circumferentially from the inguinal ligament down to the femoral bifurcation.  The superficial femoral artery, profunda femoral artery, and distal external iliac artery are looped with Silastic vessel loops.  Circumflex branches were also dissected and controlled with vessel loops as needed. Attention was then turned to the saphenofemoral junction which was skeletonized ligating tributaries between 30 and 2-0 silk ties. He was then dissected circumferentially for a distance of approximately 10 cm    Medial incision was then made in the calf and the below-knee popliteal artery was identified. It was dissected circumferentially and looped with Silastic vessel loops. The saphenous vein from the level of the femoral condyles down to the inferior aspect of this medial calf incision was then dissected circumferentially ligating and dividing tributaries encountered with 3-0 silk ties and 6-0 Prolene as needed  The patient was given 5000 units of Heparin intravenously, and this is allowed to circulate for proximally 5 minutes.  The external iliac artery, superficial femoral artery, and profunda femoral artery were then clamped along with any circumflex branches.  Arteriotomy is then made in the common femoral artery with an 11 blade and extended with Potts scissors. Arteriotomy is extended into the SFA. The common femoral, superficial femoral and profunda femoris arteries were found to be severely atherosclerotic with a large amount of plaque present.    Endarterectomy was then performed under direct visualization beginning in the common femoral artery and extending down to the profunda femoris. A smooth feathered edge was obtained. Attention was then turned to the SFA where the Viabahn stent was encountered and this was dissected circumferentially and then removed intact and  the existing plaque at the origin of the SFA was then  treated with direct endarterectomy. Again a feathered edge was obtained at this level as well no tacking sutures were necessary.  Satinsky clamp was then placed across the saphenofemoral junction saphenofemoral junction was transected and the venotomy oversewn with 5-0 Prolene. The box minimal most valve of the saphenous vein was ligated under direct visualization the vein was then spatulated slightly and an end vein to side common femoral and SFA artery anastomosis was fashioned using running 6-0 Prolene. Flow was then reestablished first to the profunda femoris and then the SFA and the bypass.  The proximal several centimeters of the saphenous vein distended quite nicely and had an excellent pulse down to the level of the first intact valve.  The distal great saphenous vein was then ligated and divided between 2-0 silk ties. A Urcel valvulotome was then opened onto the field a total of 3 passes were made beginning with a small cutting head and then making 2 passes with the 3 mm cutting head. Pulsatile blood flow was noted following passage of the valvulotome.  The proximal extent of the bypass vein was trimmed to the appropriate shape. The popliteal artery was then clamped proximally and distally and arteriotomy was made with 11 blade and extended with Potts scissors the vein was approximated to the leg and the straightened position this was marked the vein was then transected and spatulated to match the arteriotomy. 6-0 Prolene stay suture was placed in the arteriotomy. End vein to side brachial artery anastomosis was then fashioned using running 6-0 Prolene. Flushing maneuvers were performed and flow was established through the bypass. Soft pulse was noted distally.  Previous we marked tributaries (these were marked using ultrasound prior to beginning the port seizure) were then addressed through small linear incisions. Both branches were readily identified and clamped with surgical  clips.  Interrogation of the vein bypass with the Doppler demonstrated a transition site in the mid thigh. This was located right between the 2 branches which is ligated. Therefore we extended the incision and identified a transition site in the vein. Vein was then clamped proximally and distally and venotomy was made no abnormality was clearly identified and retained valve was not present. However having open the vein we used a small piece from the distal end to patch it using running 6-0 Prolene. Following completion of this patch there was a significantly improved pulse noted in the distal bypass graft. A strong pulse was now noted in the distal popliteal and tibioperoneal trunk.  The wounds were then irrigated with sterile saline.  The wounds were then inspected for hemostasis. Bleeding points were controlled with electrocautery, and suture repair of active bleeding points.  Fibrillar and AdvaSeal were then placed in the bed of the wounds. Once hemostasis was achieved.  The groin incision was then closed in multiple layers using both 2-0 and 3-0 Vicryl in interrupted and running fashion. Skin was closed with a staples. The medial calf and medial thigh wound were then closed layers using  3-0 Vicryl, and staples.   A honeycomb dressing was applied to each incision.   COMPLICATIONS: None  CONDITION: Good  Katha Cabal, M.D. [facility] Office: [phone]  09/10/2016, 5:52 PM

## 2016-09-10 NOTE — H&P (Signed)
[facility] History & Physical Update  The patient was interviewed and re-examined.  The patient's previous History and Physical has been reviewed and is unchanged.  There is no change in the plan of care. We plan to proceed with the scheduled procedure.  [name], MD  09/10/2016, 1:31 PM

## 2016-09-10 NOTE — Anesthesia Preprocedure Evaluation (Signed)
Anesthesia Evaluation  Patient identified by MRN, date of birth, ID band Patient awake    Reviewed: Allergy & Precautions, NPO status   Airway Mallampati: II       Dental  (+) Teeth Intact   Pulmonary shortness of breath, former smoker,    breath sounds clear to auscultation       Cardiovascular Exercise Tolerance: Good hypertension, Pt. on medications  Rhythm:Regular     Neuro/Psych negative neurological ROS  negative psych ROS   GI/Hepatic negative GI ROS, Neg liver ROS,   Endo/Other  negative endocrine ROS  Renal/GU negative Renal ROS     Musculoskeletal negative musculoskeletal ROS (+)   Abdominal Normal abdominal exam  (+)   Peds  Hematology negative hematology ROS (+)   Anesthesia Other Findings   Reproductive/Obstetrics                             Anesthesia Physical Anesthesia Plan  ASA: II  Anesthesia Plan: General   Post-op Pain Management:    Induction: Intravenous  Airway Management Planned: Oral ETT  Additional Equipment:   Intra-op Plan:   Post-operative Plan: Extubation in OR  Informed Consent: I have reviewed the patients History and Physical, chart, labs and discussed the procedure including the risks, benefits and alternatives for the proposed anesthesia with the patient or authorized representative who has indicated his/her understanding and acceptance.     Plan Discussed with: CRNA  Anesthesia Plan Comments:         Anesthesia Quick Evaluation

## 2016-09-10 NOTE — Op Note (Signed)
[facility]   OPERATIVE NOTE     PRE-OPERATIVE DIAGNOSIS: Atherosclerosis with rest pain of the right lower extremity  POST-OPERATIVE DIAGNOSIS: Same as above  PROCEDURE: Right femoral artery to below-the-knee popliteal artery bypass with in-situ saphenous vein graft Right common femoral, profunda femoris, and superficial femoral endarterectomy as well as removal of proximal right SFA stent  CO-SURGEONS: Leotis Pain, MD and [name], MD  ANESTHESIA: general  ESTIMATED BLOOD LOSS: 100 cc  FINDING(S): none  SPECIMEN(S):  Right common femoral, profunda femoris, and superficial femoral artery plaque. Viabahn stent removed from the proximal right superficial femoral artery.  INDICATIONS:   Joshua [name] is a 60 y.o. male who presents with worsening pain in the right lower extremity with severe atherosclerotic disease. He now is progressed to rest pain. He has had multiple previous interventions with no durability.  The patient required surgical bypass for revascularization.  Risks and benefits were discussed including but not limited to bleeding, infection, thrombosis, limb loss, injury to nearby structures, cardiopulmonary complications, and death.  DESCRIPTION: After full informed written consent was obtained, the patient was brought back to the operating room and placed supine upon the operating table.  Prior to induction, the patient was given intravenous antibiotics.  After obtaining adequate anesthesia, the patient was prepped and draped in the standard fashion for a femoral to popliteal bypass operation.  Attention was turned to the right groin.  A longitudinal incision was made over the right common femoral artery.  Using blunt dissection and electrocautery, the artery was dissected out from the inguinal ligament down to the femoral bifurcation.  The superficial femoral artery, profunda femoral artery, and external iliac artery were dissected out  and vessel loops applied.  Circumflex branches were also dissected and controlled with vessel loops.  This common femoral artery was found on exam to be thickened and significant plaque was present.    At this point, attention was turned to the calf.  An longitudinal incision was made one finger-width posterior to the tibia.  Using blunt dissection and electrocautery, a plane was developed through the subcutaneous tissue and fascia down to the popliteal space.  The popliteal vein was dissected out and retracted medially and posteriorly.  The below-the-knee popliteal artery was dissected away from the popliteal vein.  This popliteal artery was found to be a suitable target for the distal bypass anastomosis.      At this point, the patient's right greater saphenous vein was dissected out for harvest for conduit.  Skip incisions was made over the greater saphenous vein from the saphenofemoral junction down to the mid calf to allow adequate vein for bypass.  The vein conduit was found to be adequate for bypass conduit.  Side branches of greater saphenous vein were tied off with 3-0 silk ties. We dissected down beyond the site of planned distal anastomosis to allow it to swing over and perform an anastomosis without tension.  At the end of this process, I felt the conduit to be adequate in quality and size for use.  We then passed the valvulotome to remove all valves and allow for flow of blood through the vein graft. 3 passes were made with the valvulotome.    At this point, the patient was given 5000 units of Heparin intravenously.  After waiting three minutes, the external iliac artery, superficial femoral artery and profunda femoral artery were clamped.  An incision was made in the common femoral artery and extended proximally and distally  with a Engineer, drilling.  There was found to be reasonably thick plaque as well as intimal hyperplasia in the common femoral artery and down into the proximal superficial  femoral artery which is where the arteriotomy was extended to. We also encountered the previous Viabahn stent that was up to the proximal superficial femoral artery origin. An endarterectomy was then performed. The [employer] was used to create a nice plane in the common femoral artery. A nice proximal endpoint was created with gentle traction and the proximal common femoral artery. This was carried down and allowed the 6 mm diameter by 2.5 cm length Viabahn stent in the proximal SFA to be removed in its entirety. The endarterectomy was then carried down to the Stent 2-3 cm into the superficial femoral artery for a nice endpoint that was created with Potts scissors. The endarterectomy was continued down and an eversion endarterectomy was performed on the profunda femoris artery with the elevator. A nice feathered distal endpoint was created 2-3 cm beyond the origin of the profunda femoris artery. Good back bleeding was identified from the profunda femoris artery.  The vein was taken off of the saphenofemoral junction after clamping the junction.  The junction was closed with two layers of 5-0 Prolene suture.  The proximal conduit was cut and bevelled to match the arteriotomy.  The conduit was sewn to the common femoral artery and proximal superficial femoral artery with a running stitch of 5-0 Prolene.  Prior to completing this anastomosis, all vessels were backbled.  No thrombus was noted from any vessels and backbleeding was good.  The anastomosis was completed in the usual fashion.  Attention was then turned to the popliteal exposure.   I reset the exposure of the popliteal space.  I verified the popliteal artery was appropriately marked.  I determine the target segment for the anastomosis.  I applied tension to control the artery proximally and distally with vessel loops.  I made an incision with a 11-blade in the artery and extended it proximally and distally with a Potts scissor. I ligated and  divided the vein distally in the calf.  The distal vein was ligated with a 2-0 silk tie. I pulled the conduit to appropriate tension and length, taking into account straightening out the leg.  I adjusted the length of the conduit sharply.  I spatulated this conduit to meet the dimensions of the arteriotomy.  The conduit was sewn to the popliteal artery with a running stitch of 6-0 Prolene.  Prior to completing this anastomosis, all vessels were backbled. No thrombus was noted from any vessels and backbleeding was good.  The bypass conduit was allowed to bleed in an antegrade fashion with excellent pulsatile flow.  The anastomosis was completed in the usual fashion.  At this point, the previously located vein branches in the mid thigh were identified. Longitudinal incision was created in the mid thigh overlying the saphenous vein. Several vein branches were ligated with 3-0 silk ties or a clip applier. Nonetheless, there appeared to be a transition from a good pulse in the mid to distal thigh to a poor pulse in the distal thigh and a short segment of the vein bypass appeared to be the problem. It was not clear if this was a retained valve or a thickened area of the vein. The vein was clamped proximally and distally and a venotomy was created with an 11 blade and extended with Potts scissors. The vein was somewhat thickened in this location, but no  thrombus was present. There was no vein valve in this location. A small piece of the saphenous vein that had been harvested was used as a patch angioplasty to close the vein in this location to help widen it. This venotomy was closed with a 6-0 Prolene suture in a running fashion. On release of control, there was a brisk pulse was palpable all the way down into the popliteal artery below the bypass. Continuous-wave Doppler was also used to confirm excellent flow.  At this point, all incisions were washed out and fibrillar and Evicel were placed into both incisions.     At this point, bleeding in both incisions were controlled with electrocautery and suture ligature.  The calf incision was closed with interrupted deep sutures to reapproximate the deep muscles and a running stitch of 3-0 Vicryl in the subcutaneous tissue.  The skin was reapproximated with staples. .  Attention was turned to the groin.  The groin was repaired with a double layer of 2-0 Vicryl immediately superficial to the bypass conduit.  The superficial subcutaneous tissue was reapproximated with two layers of 3-0 Vicryl.  The skin was reapproximated with staples.  The skin was cleaned, dried, and a sterile dressing was placed.  The vein harvest incisions were closed with a layer of 3-0 Vicryl in the subcutaneous tissue.  The skin was then reapproximated with staples.  The skin was cleaned, dried, and sterile bandages applied.   The patient was then awakened from anesthesia and taken to the recovery room in stable condition having tolerated the procedure well.     COMPLICATIONS: none  CONDITION: stable   Leotis Pain 09/10/2016 5:44 PM   This note was created with Dragon Medical transcription system. Any errors in dictation are purely unintentional.

## 2016-09-11 ENCOUNTER — Encounter: Payer: Self-pay | Admitting: Vascular Surgery

## 2016-09-11 LAB — CBC
HCT: 36.4 % — ABNORMAL LOW (ref 40.0–52.0)
Hemoglobin: 12.1 g/dL — ABNORMAL LOW (ref 13.0–18.0)
MCH: 28.1 pg (ref 26.0–34.0)
MCHC: 33.1 g/dL (ref 32.0–36.0)
MCV: 84.8 fL (ref 80.0–100.0)
Platelets: 172 10*3/uL (ref 150–440)
RBC: 4.29 MIL/uL — ABNORMAL LOW (ref 4.40–5.90)
RDW: 13.5 % (ref 11.5–14.5)
WBC: 10.7 10*3/uL — ABNORMAL HIGH (ref 3.8–10.6)

## 2016-09-11 LAB — BASIC METABOLIC PANEL
Anion gap: 8 (ref 5–15)
BUN: 22 mg/dL — ABNORMAL HIGH (ref 6–20)
CHLORIDE: 103 mmol/L (ref 101–111)
CO2: 24 mmol/L (ref 22–32)
CREATININE: 1.2 mg/dL (ref 0.61–1.24)
Calcium: 8.2 mg/dL — ABNORMAL LOW (ref 8.9–10.3)
GFR calc Af Amer: >60 mL/min (ref >60)
GFR calc non Af Amer: >60 mL/min (ref >60)
GLUCOSE: 150 mg/dL — AB (ref 65–99)
Potassium: 4.3 mmol/L (ref 3.5–5.1)
SODIUM: 135 mmol/L (ref 135–145)

## 2016-09-11 NOTE — Care Management Note (Signed)
Case Management Note  Patient Details  Name: Joshua [name] MRN: [id number] Date of Birth: 1955/12/18  Subjective/Objective:                   Met with patient as PT was finishing up. He states he lives with his mother. He states he "has multiple walkers and said he has front-wheeled walker". He states his PCP is [facility]. He agrees to home health services but does not know which agency he would like.   Action/Plan:  Home health list provided. RNCM to follow up with patient.  Expected Discharge Date:                  Expected Discharge Plan:     In-House Referral:     Discharge planning Services  CM Consult  Post Acute Care Choice:  Home Health Choice offered to:  Patient  DME Arranged:    DME Agency:     HH Arranged:  PT HH Agency:     Status of Service:  In process, will continue to follow  If discussed at Long Length of Stay Meetings, dates discussed:    Additional Comments:  Marshell Garfinkel, RN 09/11/2016, 10:49 AM

## 2016-09-11 NOTE — Progress Notes (Signed)
Patient has been alert and oriented. No complaints of pain or shortness of breath. Tolerating diet and using urinal. Right leg pulses doppler/ weak/ cool. Honey comb dressings intact with scant old blood drainage. Pt worked with patient and he got up to chair with assistance. Family has been at bedside throughout the day.

## 2016-09-11 NOTE — Progress Notes (Signed)
[facility]  Daily Progress Note   Subjective  - 1 Day Post-Op  Pretty sore but doing ok.  No major events overnight  Objective Vitals:   09/11/16 1000 09/11/16 1100 09/11/16 1200 09/11/16 1300  BP: 109/62 (!) 101/52 (!) 102/56 (!) 102/49  Pulse:  69 69 72  Resp: '16 12 14 12  '$ Temp:      TempSrc:      SpO2:  98% 98% 99%  Weight:      Height:        Intake/Output Summary (Last 24 hours) at 09/11/16 1428 Last data filed at 09/11/16 1409  Gross per 24 hour  Intake             1760 ml  Output             2900 ml  Net            -1140 ml    PULM  CTAB CV  RRR VASC  2+ right PT pulse.  Mild bloody drainage on the bandages.  No swelling yet  Laboratory CBC    Component Value Date/Time   WBC 10.7 (H) 09/11/2016 0036   HGB 12.1 (L) 09/11/2016 0036   HCT 36.4 (L) 09/11/2016 0036   PLT 172 09/11/2016 0036    BMET    Component Value Date/Time   NA 135 09/11/2016 0036   K 4.3 09/11/2016 0036   CL 103 09/11/2016 0036   CO2 24 09/11/2016 0036   GLUCOSE 150 (H) 09/11/2016 0036   BUN 22 (H) 09/11/2016 0036   BUN 16 07/03/2014 1205   CREATININE 1.20 09/11/2016 0036   CREATININE 1.34 (H) 07/03/2014 1205   CALCIUM 8.2 (L) 09/11/2016 0036   GFRNONAA >60 09/11/2016 0036   GFRNONAA 59 (L) 07/03/2014 1205   GFRAA >60 09/11/2016 0036   GFRAA >60 07/03/2014 1205    Assessment/Planning: POD #1 s/p right femoral-popliteal bypass with femoral endarterectomy   OK to go to the floor.    Regular diet  Foley out.    Increase activity.    Likely discharge in one to two more days and would likely benefit from home PT.    Leotis Pain  09/11/2016, 2:28 PM

## 2016-09-11 NOTE — Anesthesia Postprocedure Evaluation (Signed)
Anesthesia Post Note  Patient: Joshua [name]  Procedure(s) Performed: Procedure(s) (LRB): BYPASS GRAFT FEMORAL-POPLITEAL ARTERY ( BELOW KNEE ) (Right) CENTRAL LINE INSERTION (Right)  Patient location during evaluation: ICU Anesthesia Type: General Level of consciousness: awake, awake and alert and oriented Pain management: pain level controlled Vital Signs Assessment: post-procedure vital signs reviewed and stable Respiratory status: spontaneous breathing, nonlabored ventilation and respiratory function stable Cardiovascular status: stable Anesthetic complications: no     Last Vitals:  Vitals:   09/11/16 0600 09/11/16 0700  BP: (!) 96/58 (!) 92/58  Pulse: 74   Resp: 10 11  Temp:      Last Pain:  Vitals:   09/10/16 2258  TempSrc:   PainSc: 3                  Annali Lybrand,  Torryn Hudspeth R

## 2016-09-11 NOTE — Evaluation (Signed)
Physical Therapy Evaluation Patient Details Name: Joshua [name] MRN: [id number] DOB: 12/07/1955 Today's Date: 09/11/2016   History of Present Illness  Pt is a pleasant 61 yo male s/p (09/10/16) vascular surgery procedure for; 1. Right common femoral artery to below knee popliteal artery bypass with in situ great saphenous vein, 2. Endarterectomy of the common femoral, profunda femoris and superficial femoral arteries, 3. Removal of a Viabahn stent right SFA.     Clinical Impression  Pt awake, alert and willing to participate in PT eval, demonstrated good safety awareness throughout session. Pt displayed decrease AROM and strength of the R LE secondary to pain and stiffness. Pt requires increased time for bed mobility and min assist to transfer to standing w/ RW. He ambulated w/ RW under PT supervision and displayed an antalgic gait pattern w/ decreased R knee flexion throughout the gait cycle. He requires use of a RW during stance to improve overall standing balance. Pt currently displays decreased R LE strength/ ROM and increased pain that limit functional mobility. He will benefit from skilled PT to correct deficits. Recommend HHPT following acute hospital stay.      Follow Up Recommendations Home health PT    Equipment Recommendations       Recommendations for Other Services       Precautions / Restrictions Precautions Precautions: Fall Restrictions Weight Bearing Restrictions: No      Mobility  Bed Mobility Overal bed mobility: Modified Independent             General bed mobility comments: increased time required for moving to EOB w/ HOB elevated, increase in pain noticed w/ movement of R LE  Transfers Overall transfer level: Needs assistance Equipment used: Rolling walker (2 wheeled) Transfers: Sit to/from Stand Sit to Stand: Min assist         General transfer comment: forwardly flexed and requires incresed time and min assist to move to upright posture in  standing, leans slightly to L and compensates w/ increased use of L LE and B UE  Ambulation/Gait Ambulation/Gait assistance: Supervision;+2 safety/equipment Ambulation Distance (Feet): 80 Feet Assistive device: Rolling walker (2 wheeled) Gait Pattern/deviations: Step-through pattern;Decreased stance time - right;Trunk flexed;Antalgic   Gait velocity interpretation: Below normal speed for age/gender General Gait Details: R knee remained slightly flexed throughout gait cycle and in stance, some circumduction gait of the L LE in swing phase,   Stairs            Wheelchair Mobility    Modified Rankin (Stroke Patients Only)       Balance Overall balance assessment: Needs assistance Sitting-balance support: Feet supported;No upper extremity supported Sitting balance-Leahy Scale: Good Sitting balance - Comments: good sitting posture at EOB   Standing balance support: Bilateral upper extremity supported Standing balance-Leahy Scale: Fair Standing balance comment: slightly flexed R knee, trunk forwardly flexed, requires use of UE to offload R LE and improve staiblity                              Pertinent Vitals/Pain Pain Assessment: Faces Faces Pain Scale: Hurts a little bit Pain Location: R LE Pain Descriptors / Indicators: Tightness Pain Intervention(s): Monitored during session;Limited activity within patient's tolerance    Home Living Family/patient expects to be discharged to:: Private residence Living Arrangements: Parent Available Help at Discharge: Family Type of Home: House Home Access: Ramped entrance     Home Layout: One level Home Equipment: Environmental consultant - 2  wheels;Cane - single point      Prior Function Level of Independence: Independent with assistive device(s)         Comments: able to ambulate w/ intermittent use of SPC, independent in ADLs and IADLs prior to admission     Hand Dominance        Extremity/Trunk Assessment   Upper  Extremity Assessment Upper Extremity Assessment: Overall WFL for tasks assessed    Lower Extremity Assessment Lower Extremity Assessment: RLE deficits/detail RLE Deficits / Details: decreased gross strengh at least (3/5) and limited gross AROM secondary to pain/tightness RLE: Unable to fully assess due to pain       Communication   Communication: No difficulties  Cognition Arousal/Alertness: Awake/alert Behavior During Therapy: WFL for tasks assessed/performed Overall Cognitive Status: Within Functional Limits for tasks assessed                      General Comments      Exercises General Exercises - Lower Extremity Ankle Circles/Pumps: AROM;Strengthening;Both;10 reps;Supine Heel Slides: Strengthening;Right;10 reps;AROM;Supine   Assessment/Plan    PT Assessment Patient needs continued PT services  PT Problem List Decreased strength;Decreased range of motion;Decreased activity tolerance;Decreased balance;Decreased mobility;Decreased knowledge of use of DME       PT Treatment Interventions DME instruction;Gait training;Functional mobility training;Balance training;Therapeutic exercise;Therapeutic activities;Patient/family education    PT Goals (Current goals can be found in the Care Plan section)  Acute Rehab PT Goals Patient Stated Goal: Return home  PT Goal Formulation: With patient Time For Goal Achievement: 09/25/16 Potential to Achieve Goals: Good    Frequency Min 2X/week   Barriers to discharge        Co-evaluation               End of Session Equipment Utilized During Treatment: Gait belt Activity Tolerance: Patient limited by pain Patient left: in chair;with call bell/phone within reach Nurse Communication: Mobility status PT Visit Diagnosis: Unsteadiness on feet (R26.81);Muscle weakness (generalized) (M62.81)         Time: 9326-7124 PT Time Calculation (min) (ACUTE ONLY): 24 min   Charges:         PT G Codes:         Emelda Kohlbeck Student PT 09/11/2016, 10:44 AM

## 2016-09-11 NOTE — Care Management (Addendum)
Followed up with patient and his mother about home health agencies. He would like to use [facility] for [facility] health services. I have notified Corene Cornea with [facility]. Patient uses [facility] for medications and is already on Eliquis without problems per patient.

## 2016-09-12 LAB — PREPARE RBC (CROSSMATCH)

## 2016-09-12 LAB — TYPE AND SCREEN
ABO/RH(D): A POS
Antibody Screen: NEGATIVE
Unit division: 0
Unit division: 0

## 2016-09-12 LAB — SURGICAL PATHOLOGY

## 2016-09-12 NOTE — Progress Notes (Signed)
[facility]  Daily Progress Note   Subjective  - 2 Day Post-Op  Pretty sore but doing ok.  No major events overnight he has walked 3 times today twice all the way around the nurse's station  Objective Vitals:   09/11/16 1600 09/11/16 1724 09/11/16 2042 09/12/16 0425  BP: 116/63 127/60 (!) 111/45 (!) 114/56  Pulse: 80 81 72 69  Resp: '14 18 20 20  '$ Temp:  97.9 F (36.6 C) 97.6 F (36.4 C) 98.7 F (37.1 C)  TempSrc:  Oral Oral Oral  SpO2: 99% 100% 99% 100%  Weight:  95.3 kg (210 lb)    Height:  6' (1.829 m)      Intake/Output Summary (Last 24 hours) at 09/12/16 1837 Last data filed at 09/12/16 1339  Gross per 24 hour  Intake              480 ml  Output             1650 ml  Net            -1170 ml    PULM  CTAB CV  RRR VASC  trace right PT pulse.  Mild bloody drainage on the bandages.  No swelling yet  Laboratory CBC    Component Value Date/Time   WBC 10.7 (H) 09/11/2016 0036   HGB 12.1 (L) 09/11/2016 0036   HCT 36.4 (L) 09/11/2016 0036   PLT 172 09/11/2016 0036    BMET    Component Value Date/Time   NA 135 09/11/2016 0036   K 4.3 09/11/2016 0036   CL 103 09/11/2016 0036   CO2 24 09/11/2016 0036   GLUCOSE 150 (H) 09/11/2016 0036   BUN 22 (H) 09/11/2016 0036   BUN 16 07/03/2014 1205   CREATININE 1.20 09/11/2016 0036   CREATININE 1.34 (H) 07/03/2014 1205   CALCIUM 8.2 (L) 09/11/2016 0036   GFRNONAA >60 09/11/2016 0036   GFRNONAA 59 (L) 07/03/2014 1205   GFRAA >60 09/11/2016 0036   GFRAA >60 07/03/2014 1205    Assessment/Planning: POD #2 s/p right femoral-popliteal bypass with femoral endarterectomy   Possibly home Sunday or Monday    Regular diet  Increase activity.    Likely discharge in one to two more days and would likely benefit from home PT.    [name]  09/12/2016, 6:37 PM

## 2016-09-12 NOTE — Progress Notes (Signed)
Physical Therapy Treatment Patient Details Name: Joshua [name] MRN: [id number] DOB: 04-Apr-1956 Today's Date: 09/12/2016    History of Present Illness Pt is a pleasant 61 yo male s/p (09/10/16) vascular surgery procedure for; 1. Right common femoral artery to below knee popliteal artery bypass with in situ great saphenous vein, 2. Endarterectomy of the common femoral, profunda femoris and superficial femoral arteries, 3. Removal of a Viabahn stent right SFA.     PT Comments    Pt stated he was feeling better w/ complaints of R LE stiffness. Pt displayed increased activity tolerance and able to ambulate around nursing station (200') w/ RW and PT supervision. He is unsteady on his feet and requires use of RW for improved stability in stance. During ambulation he required frequent cuing to straighten R knee in stance and to improve upright posture. He displays decreased strength and ROM of R LE and states it feels stiff; R LE stiffness and AROM improved w/ therex but is still limited. Pt will continue to benefit from skilled PT to correct strength, ROM and balance deficits to improve overall mobility, recommendation for HHPT remains the same.    Follow Up Recommendations  Home health PT     Equipment Recommendations  Rolling walker with 5" wheels    Recommendations for Other Services       Precautions / Restrictions Precautions Precautions: Fall Restrictions Weight Bearing Restrictions: No    Mobility  Bed Mobility Overal bed mobility: Modified Independent             General bed mobility comments: increased time required for moving to EOB w/ HOB elevated, increase in pain noticed w/ movement of R LE  Transfers Overall transfer level: Needs assistance Equipment used: Rolling walker (2 wheeled) Transfers: Sit to/from Stand Sit to Stand: Min guard         General transfer comment: forwardly flexed and requires incresed time and min assist to move to upright posture  in standing, leans slightly to L and compensates w/ increased use of L LE and B UE, cuing for straightening R knee and standing up tall  Ambulation/Gait Ambulation/Gait assistance: Supervision Ambulation Distance (Feet): 200 Feet Assistive device: Rolling walker (2 wheeled) Gait Pattern/deviations: Step-through pattern;Decreased step length - left;Decreased stance time - right;Antalgic;Trunk flexed   Gait velocity interpretation: Below normal speed for age/gender General Gait Details: cuing to improve upright posture and straighten R knee during stance phase, presents w/ forefoot contact on R LE and decreased gait speed, educated on safe use of RW   Stairs            Wheelchair Mobility    Modified Rankin (Stroke Patients Only)       Balance Overall balance assessment: Needs assistance Sitting-balance support: Feet supported;No upper extremity supported Sitting balance-Leahy Scale: Good Sitting balance - Comments: good sitting posture at EOB   Standing balance support: Bilateral upper extremity supported Standing balance-Leahy Scale: Fair Standing balance comment: slightly flexed R knee, trunk forwardly flexed and cuing for upright posture, requires use of UE to offload R LE and improve staiblity,                     Cognition Arousal/Alertness: Awake/alert Behavior During Therapy: WFL for tasks assessed/performed Overall Cognitive Status: Within Functional Limits for tasks assessed                      Exercises General Exercises - Lower Extremity Ankle Circles/Pumps: AROM;Strengthening;Both;Supine;Other reps (comment) (  12) Quad Sets: AROM;Strengthening;Both;Other reps (comment);Supine (12) Short Arc Quad: AROM;Strengthening;Right;15 reps;Supine Heel Slides: AROM;Strengthening;Right;15 reps;Supine Hip ABduction/ADduction: AROM;Strengthening;Right;15 reps;Supine Straight Leg Raises: AROM;Strengthening;Right;15 reps;Supine Hip Flexion/Marching:  AROM;Strengthening;Both;20 reps;Seated    General Comments        Pertinent Vitals/Pain Faces Pain Scale: Hurts a little bit Pain Location: R LE Pain Descriptors / Indicators: Tightness Pain Intervention(s): Monitored during session;Limited activity within patient's tolerance    Home Living                      Prior Function            PT Goals (current goals can now be found in the care plan section) Acute Rehab PT Goals Patient Stated Goal: Return home  PT Goal Formulation: With patient Time For Goal Achievement: 09/25/16 Potential to Achieve Goals: Good Progress towards PT goals: Progressing toward goals    Frequency    Min 2X/week      PT Plan Current plan remains appropriate    Co-evaluation             End of Session Equipment Utilized During Treatment: Gait belt Activity Tolerance: Patient tolerated treatment well Patient left: in chair;with call bell/phone within reach Nurse Communication: Mobility status PT Visit Diagnosis: Unsteadiness on feet (R26.81);Muscle weakness (generalized) (M62.81)     Time: 6803-2122 PT Time Calculation (min) (ACUTE ONLY): 24 min  Charges:                       G Codes:       [employer] Student PT 09/12/2016, 11:02 AM

## 2016-09-13 MED ORDER — BISACODYL 5 MG PO TBEC: 5.0000 mg | DELAYED_RELEASE_TABLET | Freq: Every day | ORAL | Status: DC | PRN

## 2016-09-13 NOTE — Progress Notes (Signed)
3 Days Post-Op  Subjective: Doing OK. Notes incisional pain and some intermittent toe numbness. Pain well controlled with current regimen. Ambulating.  Objective: Vital signs in last 24 hours: Temp:  [98.3 F (36.8 C)-98.5 F (36.9 C)] 98.3 F (36.8 C) (02/24 0427) Pulse Rate:  [69-75] 69 (02/24 0430) Resp:  [20] 20 (02/24 0427) BP: (96-112)/(48-54) 96/54 (02/24 0430) SpO2:  [97 %-98 %] 97 % (02/24 0430) Last BM Date: 09/09/16  Intake/Output from previous day: 02/23 0701 - 02/24 0700 In: 720 [P.O.:720] Out: 1500 [Urine:1500] Intake/Output this shift: Total I/O In: -  Out: 600 [Urine:600]  General appearance: alert and no distress Extremities: warm, no edema Pulses: 2+ and symmetric Right : trace PT Incision/Wound:intact, scant drainage  Lab Results:   Recent Labs  09/11/16 0036  WBC 10.7*  HGB 12.1*  HCT 36.4*  PLT 172   BMET  Recent Labs  09/11/16 0036  NA 135  K 4.3  CL 103  CO2 24  GLUCOSE 150*  BUN 22*  CREATININE 1.20  CALCIUM 8.2*   PT/INR No results for input(s): LABPROT, INR in the last 72 hours. ABG No results for input(s): PHART, HCO3 in the last 72 hours.  Invalid input(s): PCO2, PO2  Studies/Results: No results found.  Anti-infectives: Anti-infectives    Start     Dose/Rate Route Frequency Ordered Stop   09/10/16 2200  ceFAZolin (ANCEF) IVPB 1 g/50 mL premix     1 g 100 mL/hr over 30 Minutes Intravenous Every 8 hours 09/10/16 1749 09/11/16 1352   09/10/16 0837  ceFAZolin (ANCEF) 1 GM/50ML IVPB    Comments:  register, karen: cabinet override      09/10/16 0837 09/10/16 1441   09/10/16 0030  ceFAZolin (ANCEF) IVPB 1 g/50 mL premix     1 g 100 mL/hr over 30 Minutes Intravenous  Once 09/10/16 0020 09/10/16 1459      Assessment/Plan: s/p Procedure(s): BYPASS GRAFT FEMORAL-POPLITEAL ARTERY ( BELOW KNEE ) (Right) CENTRAL LINE INSERTION (Right) Plan for discharge tomorrow  OOB Ambulate today  LOS: 3 days    Jamesetta So  A 09/13/2016

## 2016-09-14 MED ORDER — INFLUENZA VAC SPLIT QUAD 0.5 ML IM SUSY: 0.5000 mL | PREFILLED_SYRINGE | INTRAMUSCULAR | 0 refills | Status: AC

## 2016-09-14 MED ORDER — INFLUENZA VAC SPLIT QUAD 0.5 ML IM SUSY: 0.5000 mL | PREFILLED_SYRINGE | INTRAMUSCULAR | Status: DC

## 2016-09-14 MED ORDER — INFLUENZA VAC SPLIT QUAD 0.5 ML IM SUSY
0.5000 mL | PREFILLED_SYRINGE | Freq: Once | INTRAMUSCULAR | Status: AC
  Administered 2016-09-14: 09:00:00 0.5 mL via INTRAMUSCULAR
  Filled 2016-09-14: qty 0.5

## 2016-09-14 MED ORDER — BISACODYL 5 MG PO TBEC: 5.0000 mg | DELAYED_RELEASE_TABLET | Freq: Every day | ORAL | 0 refills | Status: DC | PRN

## 2016-09-14 NOTE — Progress Notes (Signed)
MD order received to discharge pt home today; verbally reviewed AVS with pt, no questions voiced at this time; pt's discharge pending arrival of his ride home

## 2016-09-14 NOTE — Care Management Note (Signed)
Case Management Note  Patient Details  Name: Joshua [name] MRN: [id number] Date of Birth: [birth year]  Subjective/Objective:  Referral for HH-PT called to [facility], hospital liaison for [place].                   Action/Plan:   Expected Discharge Date:  09/14/16               Expected Discharge Plan:     In-House Referral:     Discharge planning Services  CM Consult  Post Acute Care Choice:  Home Health Choice offered to:  Patient  DME Arranged:    DME Agency:     HH Arranged:  PT [place]:  [place]  Status of Service:  In process, will continue to follow  If discussed at Long Length of Stay Meetings, dates discussed:    Additional Comments:  Kye Silverstein A, RN 09/14/2016, 1:59 PM

## 2016-09-14 NOTE — Progress Notes (Signed)
Pt's ride present for discharge; pt discharged via wheelchair by nursing to the visitor's entrance 

## 2016-09-14 NOTE — Plan of Care (Signed)
Pt d/ced home.  States he hasn't had pain since the operation.  Dressed upper incision that is weeping slightly serosanguinous fluid past staples.  Instructed him how to clean with NS and dress with gauze.  Removed the R IJ. Also gave him dressing supplies for this.  Stressed that he's not to drive for 4 weeks and call the dr asap if he startes to run a fever. Robyn, charge nurse is going to complete the discharge.

## 2016-09-14 NOTE — Discharge Summary (Signed)
Physician Discharge Summary  Patient ID: SENECA HOBACK MRN: [id number] DOB/AGE: [birth year] 61 y.o.  Admit date: 09/10/2016 Discharge date: 09/14/2016  Admission Diagnoses: Right Lower extremity rest pain  Discharge Diagnoses:  Active Problems:   Atherosclerosis of artery of extremity with rest pain [facility])   Discharged Condition: good  Hospital Course: Patient admitted for elective femoral to below knee popliteal bypass. Patient did well perioperatively. Pain was well controlled and ambulating with walker assistance.  Consults: None  Significant Diagnostic Studies: none  Treatments: IV hydration, anticoagulation: ASA and Eliquis and surgery:   Discharge Exam: Blood pressure (!) 102/47, pulse 68, temperature 99.5 F (37.5 C), temperature source Oral, resp. rate 20, height 6' (1.829 m), weight 95.3 kg (210 lb), SpO2 97 %. General appearance: alert, cooperative and no distress Resp: clear to auscultation bilaterally Cardio: regular rate and rhythm, S1, S2 normal, no murmur, click, rub or gallop Extremities: no edema Pulses: Faint PT pulse, foot warm, good capilaary refill Incision/Wound:Clean, dry, intact  Disposition: 01-Home or Self Care  Discharge Instructions    Call MD for:    Complete by:  As directed    Call MD for:  redness, tenderness, or signs of infection (pain, swelling, bleeding, redness, odor or green/yellow discharge around incision site)    Complete by:  As directed    Call MD for:  severe or increased pain, loss or decreased feeling  in affected limb(s)    Complete by:  As directed    Call MD for:  temperature >100.5    Complete by:  As directed    Change dressing (specify)    Complete by:  As directed    Dressing change: 1 times per day using dry 4 x4. For additional 72 hours. Then leave open to air   Discharge instructions    Complete by:  As directed    Please call/return for increased pain, redness or drainage from incisions. Increased pain or  numbness in leg. Fever or chills   Driving Restrictions    Complete by:  As directed    No driving for 4 weeks   Increase activity slowly    Complete by:  As directed    Walk with assistance use walker or cane as needed   Lifting restrictions    Complete by:  As directed    No heavy lifting or strenuous activity or lifting for 4 weeks   May shower     Complete by:  As directed    Resume previous diet    Complete by:  As directed    Walker     Complete by:  As directed    may wash over wound with mild soap and water    Complete by:  As directed      Allergies as of 09/14/2016   No Known Allergies     Medication List    TAKE these medications   acetaminophen 500 MG tablet Commonly known as:  TYLENOL Take 1,000 mg by mouth every 6 (six) hours as needed for mild pain (for pain).   aspirin EC 81 MG tablet Take 81 mg by mouth daily.   bisacodyl 5 MG EC tablet Commonly known as:  DULCOLAX Take 1 tablet (5 mg total) by mouth daily as needed for moderate constipation.   ELIQUIS 5 MG Tabs tablet Generic drug:  apixaban TAKE 1 TABLET (5 MG) BY MOUTH TWICE DAILY   hydrochlorothiazide 12.5 MG tablet Commonly known as:  HYDRODIURIL TAKE 1 TABLET(12.5 MG) BY MOUTH  EVERY DAY   lisinopril 20 MG tablet Commonly known as:  PRINIVIL,ZESTRIL Take 20 mg by mouth daily.   simvastatin 20 MG tablet Commonly known as:  ZOCOR Take 20 mg by mouth daily at 6 PM.      Follow-up Information    [name], MD Follow up in 2 week(s).   Specialties:  Vascular Surgery, Cardiology, Radiology, Vascular Surgery Contact information: [place] [address] [phone]           Signed: Evaristo Bury 09/14/2016, 6:44 AM

## 2016-09-14 NOTE — Care Management Note (Addendum)
Case Management Note  Patient Details  Name: Joshua [name] MRN: [id number] Date of Birth: 11-30-1955  Subjective/Objective:       Called and left message for Dr Lorenso Courier per no order for HH-PT as recommended by [facility]-PT dept.  Dr Lorenso Courier called back and this writer requested an order and a Medical Necessity for HH-PT for Joshua [name] as recommended by [facility]-PT. Dr Lorenso Courier stated that she would put the order in around 1pm today per she is not currently at [facility]. Called [facility] and spoke with Gerald Stabs to request that chart not be closed in [facility] because [facility] would not allow new orders to be placed a couple of hours after chart is closed.             Action/Plan:   Expected Discharge Date:  09/14/16               Expected Discharge Plan:     In-House Referral:     Discharge planning Services  CM Consult  Post Acute Care Choice:  Home Health Choice offered to:  Patient  DME Arranged:    DME Agency:     HH Arranged:  PT [place]:  [place]  Status of Service:  In process, will continue to follow  If discussed at Long Length of Stay Meetings, dates discussed:    Additional Comments:  Kim Lauver A, RN 09/14/2016, 8:54 AM

## 2016-09-25 ENCOUNTER — Ambulatory Visit (INDEPENDENT_AMBULATORY_CARE_PROVIDER_SITE_OTHER): Payer: BLUE CROSS/BLUE SHIELD | Admitting: Vascular Surgery

## 2016-09-25 ENCOUNTER — Encounter (INDEPENDENT_AMBULATORY_CARE_PROVIDER_SITE_OTHER): Payer: Self-pay | Admitting: Vascular Surgery

## 2016-09-25 VITALS — BP 103/57 | HR 64 | Resp 16 | Ht 72.0 in | Wt 203.0 lb

## 2016-09-25 DIAGNOSIS — M79604 Pain in right leg: Secondary | ICD-10-CM

## 2016-09-25 DIAGNOSIS — E782 Mixed hyperlipidemia: Secondary | ICD-10-CM

## 2016-09-25 DIAGNOSIS — I70229 Atherosclerosis of native arteries of extremities with rest pain, unspecified extremity: Secondary | ICD-10-CM

## 2016-09-25 DIAGNOSIS — I10 Essential (primary) hypertension: Secondary | ICD-10-CM

## 2016-09-25 NOTE — Progress Notes (Signed)
MRN : [id number]  Joshua [name] is a 61 y.o. (1956-02-27) male who presents with chief complaint of  Chief Complaint  Patient presents with  . Re-evaluation    Staple removal  .  History of Present Illness: The patient returns to the office for followup and review status post right fem below knee popliteal in situ bypass. The patient notes improvement in the lower extremity symptoms. No interval shortening of the patient's claudication distance or rest pain symptoms. Previous wounds have now healed.  No new ulcers or wounds have occurred since the last visit.  There have been no significant changes to the patient's overall health care.  The patient denies amaurosis fugax or recent TIA symptoms. There are no recent neurological changes noted. The patient denies history of DVT, PE or superficial thrombophlebitis. The patient denies recent episodes of angina or shortness of breath.    Current Meds  Medication Sig  . acetaminophen (TYLENOL) 500 MG tablet Take 1,000 mg by mouth every 6 (six) hours as needed for mild pain (for pain).   Marland Kitchen aspirin EC 81 MG tablet Take 81 mg by mouth daily.  . bisacodyl (DULCOLAX) 5 MG EC tablet Take 1 tablet (5 mg total) by mouth daily as needed for moderate constipation.  Marland Kitchen ELIQUIS 5 MG TABS tablet TAKE 1 TABLET (5 MG) BY MOUTH TWICE DAILY  . hydrochlorothiazide (HYDRODIURIL) 12.5 MG tablet TAKE 1 TABLET(12.5 MG) BY MOUTH EVERY DAY  . lisinopril (PRINIVIL,ZESTRIL) 20 MG tablet Take 20 mg by mouth daily.  . simvastatin (ZOCOR) 20 MG tablet Take 20 mg by mouth daily at 6 PM.     Past Medical History:  Diagnosis Date  . Dyspnea   . Hypertension   . Peripheral vascular disease ([place])   . Pre-diabetes     Past Surgical History:  Procedure Laterality Date  . BACK SURGERY  2011  . CENTRAL LINE INSERTION Right 09/10/2016   Procedure: CENTRAL LINE INSERTION;  Surgeon: Katha Cabal, MD;  Location: [facility];  Service: Vascular;  Laterality:  Right;  . COLONOSCOPY  2013 ?  . FEMORAL-POPLITEAL BYPASS GRAFT Right 09/10/2016   Procedure: BYPASS GRAFT FEMORAL-POPLITEAL ARTERY ( BELOW KNEE );  Surgeon: Katha Cabal, MD;  Location: [facility];  Service: Vascular;  Laterality: Right;  . PERIPHERAL VASCULAR CATHETERIZATION Right 01/02/2015   Procedure: Lower Extremity Angiography;  Surgeon: Katha Cabal, MD;  Location: [place] [facility];  Service: Cardiovascular;  Laterality: Right;  . PERIPHERAL VASCULAR CATHETERIZATION Right 06/18/2015   Procedure: Lower Extremity Angiography;  Surgeon: Algernon Huxley, MD;  Location: [place] [facility];  Service: Cardiovascular;  Laterality: Right;  . PERIPHERAL VASCULAR CATHETERIZATION  06/18/2015   Procedure: Lower Extremity Intervention;  Surgeon: Algernon Huxley, MD;  Location: [place] [facility];  Service: Cardiovascular;;  . PERIPHERAL VASCULAR CATHETERIZATION N/A 10/09/2015   Procedure: Abdominal Aortogram w/Lower Extremity;  Surgeon: Katha Cabal, MD;  Location: [place] [facility];  Service: Cardiovascular;  Laterality: N/A;  . PERIPHERAL VASCULAR CATHETERIZATION  10/09/2015   Procedure: Lower Extremity Intervention;  Surgeon: Katha Cabal, MD;  Location: [place] [facility];  Service: Cardiovascular;;  . PERIPHERAL VASCULAR CATHETERIZATION Right 10/10/2015   Procedure: Lower Extremity Angiography;  Surgeon: Algernon Huxley, MD;  Location: [place] [facility];  Service: Cardiovascular;  Laterality: Right;  . PERIPHERAL VASCULAR CATHETERIZATION Right 10/10/2015   Procedure: Lower Extremity Intervention;  Surgeon: Algernon Huxley, MD;  Location: [place] [facility];  Service: Cardiovascular;  Laterality: Right;  . PERIPHERAL VASCULAR CATHETERIZATION Right 12/03/2015   Procedure: Lower Extremity Angiography;  Surgeon: Algernon Huxley, MD;  Location: [place] [facility];  Service: Cardiovascular;  Laterality: Right;  . PERIPHERAL VASCULAR CATHETERIZATION Right 12/04/2015   Procedure: Lower  Extremity Angiography;  Surgeon: Algernon Huxley, MD;  Location: [place] [facility];  Service: Cardiovascular;  Laterality: Right;  . PERIPHERAL VASCULAR CATHETERIZATION  12/04/2015   Procedure: Lower Extremity Intervention;  Surgeon: Algernon Huxley, MD;  Location: [place] [facility];  Service: Cardiovascular;;  . PERIPHERAL VASCULAR CATHETERIZATION Right 06/30/2016   Procedure: Lower Extremity Angiography;  Surgeon: Algernon Huxley, MD;  Location: [place] [facility];  Service: Cardiovascular;  Laterality: Right;  . PERIPHERAL VASCULAR CATHETERIZATION Right 06/25/2016   Procedure: Lower Extremity Angiography;  Surgeon: Katha Cabal, MD;  Location: [place] [facility];  Service: Cardiovascular;  Laterality: Right;  . PERIPHERAL VASCULAR CATHETERIZATION Right 07/01/2016   Procedure: Lower Extremity Angiography;  Surgeon: Katha Cabal, MD;  Location: [place] [facility];  Service: Cardiovascular;  Laterality: Right;  . PERIPHERAL VASCULAR CATHETERIZATION  07/01/2016   Procedure: Lower Extremity Intervention;  Surgeon: Katha Cabal, MD;  Location: [place] [facility];  Service: Cardiovascular;;  . PERIPHERAL VASCULAR CATHETERIZATION Right 08/01/2016   Procedure: Lower Extremity Angiography;  Surgeon: Katha Cabal, MD;  Location: [place] [facility];  Service: Cardiovascular;  Laterality: Right;  . stent placement in right leg Right     Social History Social History  Substance Use Topics  . Smoking status: Former Smoker    Packs/day: 1.50    Types: Cigarettes    Quit date: 12/20/2014  . Smokeless tobacco: Former Systems developer  . Alcohol use No    Family History No family history on file. No family history of bleeding/clotting disorders, porphyria or autoimmune disease   No Known Allergies   REVIEW OF SYSTEMS (Negative unless checked)  Constitutional: '[]'$ Weight loss  '[]'$ Fever  '[]'$ Chills Cardiac: '[]'$ Chest pain   '[]'$ Chest pressure   '[]'$ Palpitations   '[]'$ Shortness of breath when laying flat    '[]'$ Shortness of breath with exertion. Vascular:  '[]'$ Pain in legs with walking   '[]'$ Pain in legs at rest  '[]'$ History of DVT   '[]'$ Phlebitis   '[]'$ Swelling in legs   '[]'$ Varicose veins   '[]'$ Non-healing ulcers Pulmonary:   '[]'$ Uses home oxygen   '[]'$ Productive cough   '[]'$ Hemoptysis   '[]'$ Wheeze  '[]'$ COPD   '[]'$ Asthma Neurologic:  '[]'$ Dizziness   '[]'$ Seizures   '[]'$ History of stroke   '[]'$ History of TIA  '[]'$ Aphasia   '[]'$ Vissual changes   '[]'$ Weakness or numbness in arm   '[]'$ Weakness or numbness in leg Musculoskeletal:   '[]'$ Joint swelling   '[]'$ Joint pain   '[]'$ Low back pain Hematologic:  '[]'$ Easy bruising  '[]'$ Easy bleeding   '[]'$ Hypercoagulable state   '[]'$ Anemic Gastrointestinal:  '[]'$ Diarrhea   '[]'$ Vomiting  '[]'$ Gastroesophageal reflux/heartburn   '[]'$ Difficulty swallowing. Genitourinary:  '[]'$ Chronic kidney disease   '[]'$ Difficult urination  '[]'$ Frequent urination   '[]'$ Blood in urine Skin:  '[]'$ Rashes   '[]'$ Ulcers  Psychological:  '[]'$ History of anxiety   '[]'$  History of major depression.  Physical Examination  Vitals:   09/25/16 1441  BP: (!) 103/57  Pulse: 64  Resp: 16  Weight: 203 lb (92.1 kg)  Height: 6' (1.829 m)   Body mass index is 27.53 kg/m. Gen: WD/WN, NAD Head: [place]/AT, No temporalis wasting.  Ear/Nose/Throat: Hearing grossly intact, nares w/o erythema or drainage, poor dentition Eyes: PER, EOMI, sclera nonicteric.  Neck: Supple, no masses.  No bruit or JVD.  Pulmonary:  Good air movement, clear to auscultation bilaterally, no use of accessory muscles.  Cardiac: RRR, normal S1, S2, no Murmurs. Vascular: incisions with staples intact Vessel Right Left  Radial Palpable Palpable  Ulnar Palpable Palpable  Brachial Palpable Palpable  Carotid Palpable Palpable  Femoral Palpable Palpable  Popliteal Not Palpable Not Palpable  PT Not Palpable Not Palpable  DP Not Palpable Not Palpable   Gastrointestinal: soft, non-distended. No guarding/no peritoneal signs.  Musculoskeletal: M/S 5/5 throughout.  No deformity or atrophy.  Neurologic: CN 2-12  intact. Pain and light touch intact in extremities.  Symmetrical.  Speech is fluent. Motor exam as listed above. Psychiatric: Judgment intact, Mood & affect appropriate for pt's clinical situation. Dermatologic: No rashes or ulcers noted.  No changes consistent with cellulitis. Lymph : No Cervical lymphadenopathy, no lichenification or skin changes of chronic lymphedema.  CBC Lab Results  Component Value Date   WBC 10.7 (H) 09/11/2016   HGB 12.1 (L) 09/11/2016   HCT 36.4 (L) 09/11/2016   MCV 84.8 09/11/2016   PLT 172 09/11/2016    BMET    Component Value Date/Time   NA 135 09/11/2016 0036   K 4.3 09/11/2016 0036   CL 103 09/11/2016 0036   CO2 24 09/11/2016 0036   GLUCOSE 150 (H) 09/11/2016 0036   BUN 22 (H) 09/11/2016 0036   BUN 16 07/03/2014 1205   CREATININE 1.20 09/11/2016 0036   CREATININE 1.34 (H) 07/03/2014 1205   CALCIUM 8.2 (L) 09/11/2016 0036   GFRNONAA >60 09/11/2016 0036   GFRNONAA 59 (L) 07/03/2014 1205   GFRAA >60 09/11/2016 0036   GFRAA >60 07/03/2014 1205   Estimated Creatinine Clearance: 73.6 mL/min (by C-G formula based on SCr of 1.2 mg/dL).  COAG Lab Results  Component Value Date   INR 1.11 06/25/2016   INR 1.90 05/12/2016   INR 1.20 12/05/2015    Radiology No results found.  Assessment/Plan 1. Atherosclerosis of artery of extremity with rest pain ([place]) Recommend:  The patient is status post successful bypass on the right.  The patient reports that the claudication symptoms and leg pain is essentially gone.   The patient denies lifestyle limiting changes at this point in time.  No further invasive studies, angiography or surgery at this time The patient should continue walking and begin a more formal exercise program.  The patient should continue antiplatelet therapy and aggressive treatment of the lipid abnormalities  Smoking cessation was again discussed  The patient should continue wearing graduated compression socks 10-15 mmHg strength  to control the mild edema.  Patient should undergo noninvasive studies as ordered. The patient will follow up with me after the studies.   - VAS [place] ABI WITH/WO TBI; Future - VAS [place] LOWER EXTREMITY ARTERIAL DUPLEX; Future  2. Essential hypertension Continue antihypertensive medications as already ordered, these medications have been reviewed and there are no changes at this time.  3. Pain of right lower extremity See #1  4. Mixed hyperlipidemia Continue statin as ordered and reviewed, no changes at this time   [name], MD  09/25/2016 5:21 PM

## 2016-10-12 ENCOUNTER — Other Ambulatory Visit (INDEPENDENT_AMBULATORY_CARE_PROVIDER_SITE_OTHER): Payer: Self-pay | Admitting: Vascular Surgery

## 2016-10-31 ENCOUNTER — Other Ambulatory Visit (INDEPENDENT_AMBULATORY_CARE_PROVIDER_SITE_OTHER): Payer: Self-pay | Admitting: Vascular Surgery

## 2016-11-13 ENCOUNTER — Ambulatory Visit (INDEPENDENT_AMBULATORY_CARE_PROVIDER_SITE_OTHER): Payer: BLUE CROSS/BLUE SHIELD

## 2016-11-13 ENCOUNTER — Encounter (INDEPENDENT_AMBULATORY_CARE_PROVIDER_SITE_OTHER): Payer: Self-pay | Admitting: Vascular Surgery

## 2016-11-13 ENCOUNTER — Ambulatory Visit (INDEPENDENT_AMBULATORY_CARE_PROVIDER_SITE_OTHER): Payer: BLUE CROSS/BLUE SHIELD | Admitting: Vascular Surgery

## 2016-11-13 VITALS — BP 135/68 | HR 72 | Resp 17 | Wt 211.0 lb

## 2016-11-13 DIAGNOSIS — I70229 Atherosclerosis of native arteries of extremities with rest pain, unspecified extremity: Secondary | ICD-10-CM

## 2016-11-13 DIAGNOSIS — M79604 Pain in right leg: Secondary | ICD-10-CM

## 2016-11-13 DIAGNOSIS — I70221 Atherosclerosis of native arteries of extremities with rest pain, right leg: Secondary | ICD-10-CM

## 2016-11-13 DIAGNOSIS — E782 Mixed hyperlipidemia: Secondary | ICD-10-CM

## 2016-11-13 DIAGNOSIS — T829XXA Unspecified complication of cardiac and vascular prosthetic device, implant and graft, initial encounter: Secondary | ICD-10-CM | POA: Insufficient documentation

## 2016-11-13 DIAGNOSIS — I10 Essential (primary) hypertension: Secondary | ICD-10-CM

## 2016-11-13 DIAGNOSIS — T82858A Stenosis of vascular prosthetic devices, implants and grafts, initial encounter: Secondary | ICD-10-CM

## 2016-11-13 NOTE — Progress Notes (Signed)
Patient ID: Joshua [name], male   DOB: 07/04/1956, 61 y.o.   MRN: [id number]  Chief Complaint  Patient presents with  . Follow-up    HPI Joshua [name] is a 61 y.o. male.  The patient returns to the office for followup and review of the noninvasive studies. There have been no interval changes in lower extremity symptoms. No interval shortening of the patient's claudication distance or development of rest pain symptoms. No new ulcers or wounds have occurred since the last visit.  There have been no significant changes to the patient's overall health care.  The patient denies amaurosis fugax or recent TIA symptoms. There are no recent neurological changes noted. The patient denies history of DVT, PE or superficial thrombophlebitis. The patient denies recent episodes of angina or shortness of breath.   ABI Rt=0.42 and Lt=1.11  Duplex ultrasound of the right leg shows the bypass is patent but there is a high grade focal stenosis in the mid graft   Past Medical History:  Diagnosis Date  . Dyspnea   . Hypertension   . Peripheral vascular disease ([place])   . Pre-diabetes     Past Surgical History:  Procedure Laterality Date  . BACK SURGERY  2011  . CENTRAL LINE INSERTION Right 09/10/2016   Procedure: CENTRAL LINE INSERTION;  Surgeon: Katha Cabal, MD;  Location: [facility];  Service: Vascular;  Laterality: Right;  . COLONOSCOPY  2013 ?  . FEMORAL-POPLITEAL BYPASS GRAFT Right 09/10/2016   Procedure: BYPASS GRAFT FEMORAL-POPLITEAL ARTERY ( BELOW KNEE );  Surgeon: Katha Cabal, MD;  Location: [facility];  Service: Vascular;  Laterality: Right;  . PERIPHERAL VASCULAR CATHETERIZATION Right 01/02/2015   Procedure: Lower Extremity Angiography;  Surgeon: Katha Cabal, MD;  Location: [place] [facility];  Service: Cardiovascular;  Laterality: Right;  . PERIPHERAL VASCULAR CATHETERIZATION Right 06/18/2015   Procedure: Lower Extremity Angiography;  Surgeon: Algernon Huxley,  MD;  Location: [place] [facility];  Service: Cardiovascular;  Laterality: Right;  . PERIPHERAL VASCULAR CATHETERIZATION  06/18/2015   Procedure: Lower Extremity Intervention;  Surgeon: Algernon Huxley, MD;  Location: [place] [facility];  Service: Cardiovascular;;  . PERIPHERAL VASCULAR CATHETERIZATION N/A 10/09/2015   Procedure: Abdominal Aortogram w/Lower Extremity;  Surgeon: Katha Cabal, MD;  Location: [place] [facility];  Service: Cardiovascular;  Laterality: N/A;  . PERIPHERAL VASCULAR CATHETERIZATION  10/09/2015   Procedure: Lower Extremity Intervention;  Surgeon: Katha Cabal, MD;  Location: [place] [facility];  Service: Cardiovascular;;  . PERIPHERAL VASCULAR CATHETERIZATION Right 10/10/2015   Procedure: Lower Extremity Angiography;  Surgeon: Algernon Huxley, MD;  Location: [place] [facility];  Service: Cardiovascular;  Laterality: Right;  . PERIPHERAL VASCULAR CATHETERIZATION Right 10/10/2015   Procedure: Lower Extremity Intervention;  Surgeon: Algernon Huxley, MD;  Location: [place] [facility];  Service: Cardiovascular;  Laterality: Right;  . PERIPHERAL VASCULAR CATHETERIZATION Right 12/03/2015   Procedure: Lower Extremity Angiography;  Surgeon: Algernon Huxley, MD;  Location: [place] [facility];  Service: Cardiovascular;  Laterality: Right;  . PERIPHERAL VASCULAR CATHETERIZATION Right 12/04/2015   Procedure: Lower Extremity Angiography;  Surgeon: Algernon Huxley, MD;  Location: [place] [facility];  Service: Cardiovascular;  Laterality: Right;  . PERIPHERAL VASCULAR CATHETERIZATION  12/04/2015   Procedure: Lower Extremity Intervention;  Surgeon: Algernon Huxley, MD;  Location: [place] [facility];  Service: Cardiovascular;;  . PERIPHERAL VASCULAR CATHETERIZATION Right 06/30/2016   Procedure: Lower Extremity Angiography;  Surgeon: Algernon Huxley, MD;  Location: [place] [facility];  Service: Cardiovascular;  Laterality: Right;  . PERIPHERAL VASCULAR CATHETERIZATION Right 06/25/2016   Procedure:  Lower Extremity Angiography;  Surgeon: Katha Cabal, MD;  Location: [place] [facility];  Service: Cardiovascular;  Laterality: Right;  . PERIPHERAL VASCULAR CATHETERIZATION Right 07/01/2016   Procedure: Lower Extremity Angiography;  Surgeon: Katha Cabal, MD;  Location: [place] [facility];  Service: Cardiovascular;  Laterality: Right;  . PERIPHERAL VASCULAR CATHETERIZATION  07/01/2016   Procedure: Lower Extremity Intervention;  Surgeon: Katha Cabal, MD;  Location: [place] [facility];  Service: Cardiovascular;;  . PERIPHERAL VASCULAR CATHETERIZATION Right 08/01/2016   Procedure: Lower Extremity Angiography;  Surgeon: Katha Cabal, MD;  Location: [place] [facility];  Service: Cardiovascular;  Laterality: Right;  . stent placement in right leg Right       No Known Allergies  Current Outpatient Prescriptions  Medication Sig Dispense Refill  . acetaminophen (TYLENOL) 500 MG tablet Take 1,000 mg by mouth every 6 (six) hours as needed for mild pain (for pain).     Marland Kitchen aspirin EC 81 MG tablet Take 81 mg by mouth daily.    . bisacodyl (DULCOLAX) 5 MG EC tablet Take 1 tablet (5 mg total) by mouth daily as needed for moderate constipation. 30 tablet 0  . ELIQUIS 5 MG TABS tablet TAKE 1 TABLET (5 MG) BY MOUTH TWICE DAILY  6  . hydrochlorothiazide (HYDRODIURIL) 12.5 MG tablet TAKE 1 TABLET(12.5 MG) BY MOUTH EVERY DAY    . lisinopril (PRINIVIL,ZESTRIL) 20 MG tablet Take 20 mg by mouth daily.    . simvastatin (ZOCOR) 20 MG tablet TAKE 1 TABLET BY MOUTH DAILY 90 tablet 0   No current facility-administered medications for this visit.         Physical Exam BP 135/68   Pulse 72   Resp 17   Wt 95.7 kg (211 lb)   BMI 28.62 kg/m  Gen:  WD/WN, NAD Skin: incision C/D/I Right pedal pulses not palpable     Assessment/Plan:    Recommend:  The patient has been s/p successful bypass but there is a significant stenosis in the mid portion of the vein and he is at risk  for loss of his bypass.  Given the severity of the patient's lower extremity symptoms the patient should undergo right leg angiography and intervention.  Risk and benefits were reviewed the patient.  Indications for the procedure were reviewed.  All questions were answered, the patient agrees to proceed.   The patient should continue walking and begin a more formal exercise program.  The patient should continue antiplatelet therapy and aggressive treatment of the lipid abnormalities  The patient will follow up with me after the angiogram.    [name] 11/13/2016, 5:10 PM   This note was created with Dragon medical transcription system.  Any errors from dictation are unintentional.

## 2016-11-13 NOTE — Progress Notes (Signed)
fol

## 2016-11-17 ENCOUNTER — Other Ambulatory Visit (INDEPENDENT_AMBULATORY_CARE_PROVIDER_SITE_OTHER): Payer: Self-pay | Admitting: Vascular Surgery

## 2016-11-17 ENCOUNTER — Encounter
Admission: RE | Admit: 2016-11-17 | Discharge: 2016-11-17 | Disposition: A | Discharge status: Home / Self Care | Payer: BLUE CROSS/BLUE SHIELD | Source: Ambulatory Visit | Attending: Vascular Surgery | Admitting: Vascular Surgery

## 2016-11-17 HISTORY — DX: Hyperlipidemia, unspecified: E78.5

## 2016-11-17 LAB — BUN: BUN: 23 mg/dL — ABNORMAL HIGH (ref 6–20)

## 2016-11-17 LAB — CREATININE, SERUM: CREATININE: 1.05 mg/dL (ref 0.61–1.24)

## 2016-11-17 MED ORDER — CEFAZOLIN SODIUM-DEXTROSE 1-4 GM/50ML-% IV SOLN: 1.0000 g | Freq: Once | INTRAVENOUS | Status: DC

## 2016-11-18 ENCOUNTER — Inpatient Hospital Stay
Admission: AD | Admit: 2016-11-18 | Discharge: 2016-11-19 | DRG: 254 | Disposition: A | Discharge status: Home / Self Care | Payer: BLUE CROSS/BLUE SHIELD | Source: Ambulatory Visit | Attending: Vascular Surgery | Admitting: Vascular Surgery

## 2016-11-18 ENCOUNTER — Encounter
Admission: AD | Disposition: A | Discharge status: Home / Self Care | Payer: Self-pay | Source: Ambulatory Visit | Attending: Vascular Surgery

## 2016-11-18 DIAGNOSIS — M79604 Pain in right leg: Secondary | ICD-10-CM | POA: Diagnosis present

## 2016-11-18 DIAGNOSIS — I70219 Atherosclerosis of native arteries of extremities with intermittent claudication, unspecified extremity: Secondary | ICD-10-CM | POA: Diagnosis present

## 2016-11-18 DIAGNOSIS — I70421 Atherosclerosis of autologous vein bypass graft(s) of the extremities with rest pain, right leg: Principal | ICD-10-CM | POA: Diagnosis present

## 2016-11-18 DIAGNOSIS — R7303 Prediabetes: Secondary | ICD-10-CM | POA: Diagnosis present

## 2016-11-18 DIAGNOSIS — I10 Essential (primary) hypertension: Secondary | ICD-10-CM | POA: Diagnosis present

## 2016-11-18 DIAGNOSIS — I70411 Atherosclerosis of autologous vein bypass graft(s) of the extremities with intermittent claudication, right leg: Secondary | ICD-10-CM

## 2016-11-18 HISTORY — PX: LOWER EXTREMITY ANGIOGRAPHY: CATH118251

## 2016-11-18 HISTORY — PX: LOWER EXTREMITY INTERVENTION: CATH118252

## 2016-11-18 SURGERY — LOWER EXTREMITY ANGIOGRAPHY
Anesthesia: Moderate Sedation | Laterality: Right

## 2016-11-18 MED ORDER — MORPHINE SULFATE (PF) 4 MG/ML IV SOLN: 2.0000 mg | INTRAVENOUS | Status: DC | PRN

## 2016-11-18 MED ORDER — HEPARIN SODIUM (PORCINE) 1000 UNIT/ML IJ SOLN
INTRAMUSCULAR | Status: DC | PRN
  Administered 2016-11-18: 5000 [IU] via INTRAVENOUS

## 2016-11-18 MED ORDER — FAMOTIDINE 20 MG PO TABS: 40.0000 mg | ORAL_TABLET | ORAL | Status: DC | PRN

## 2016-11-18 MED ORDER — MIDAZOLAM HCL 5 MG/5ML IJ SOLN
INTRAMUSCULAR | Status: AC
  Filled 2016-11-18: qty 5

## 2016-11-18 MED ORDER — ACETAMINOPHEN 500 MG PO TABS: 1000.0000 mg | ORAL_TABLET | Freq: Four times a day (QID) | ORAL | Status: DC | PRN

## 2016-11-18 MED ORDER — SODIUM CHLORIDE 0.9 % IV SOLN: INTRAVENOUS | Status: DC

## 2016-11-18 MED ORDER — ONDANSETRON HCL 4 MG/2ML IJ SOLN: 4.0000 mg | Freq: Four times a day (QID) | INTRAMUSCULAR | Status: DC | PRN

## 2016-11-18 MED ORDER — PANTOPRAZOLE SODIUM 40 MG PO TBEC
40.0000 mg | DELAYED_RELEASE_TABLET | Freq: Every day | ORAL | Status: DC
  Administered 2016-11-18 – 2016-11-19 (×2): 40 mg via ORAL
  Filled 2016-11-18 (×2): qty 1

## 2016-11-18 MED ORDER — SODIUM CHLORIDE 0.9 % IV SOLN: 500.0000 mL | Freq: Once | INTRAVENOUS | Status: DC | PRN

## 2016-11-18 MED ORDER — MAGNESIUM CITRATE PO SOLN
1.0000 | Freq: Once | ORAL | Status: DC | PRN
  Filled 2016-11-18: qty 296

## 2016-11-18 MED ORDER — SORBITOL 70 % SOLN: 30.0000 mL | Freq: Every day | Status: DC | PRN

## 2016-11-18 MED ORDER — CLOPIDOGREL BISULFATE 75 MG PO TABS
ORAL_TABLET | ORAL | Status: AC
  Administered 2016-11-18: 300 mg via ORAL
  Filled 2016-11-18: qty 4

## 2016-11-18 MED ORDER — CLOPIDOGREL BISULFATE 300 MG PO TABS
300.0000 mg | ORAL_TABLET | Freq: Once | ORAL | Status: AC
Start: 2016-11-18 — End: 2016-11-18
  Administered 2016-11-18: 300 mg via ORAL

## 2016-11-18 MED ORDER — IOPAMIDOL (ISOVUE-300) INJECTION 61%
INTRAVENOUS | Status: DC | PRN
  Administered 2016-11-18: 80 mL via INTRA_ARTERIAL

## 2016-11-18 MED ORDER — HEPARIN (PORCINE) IN NACL 2-0.9 UNIT/ML-% IJ SOLN
INTRAMUSCULAR | Status: AC
  Filled 2016-11-18: qty 1000

## 2016-11-18 MED ORDER — SIMVASTATIN 20 MG PO TABS
20.0000 mg | ORAL_TABLET | Freq: Every day | ORAL | Status: DC
  Administered 2016-11-18: 20 mg via ORAL
  Filled 2016-11-18: qty 1

## 2016-11-18 MED ORDER — HYDROCHLOROTHIAZIDE 12.5 MG PO CAPS
12.5000 mg | ORAL_CAPSULE | Freq: Every day | ORAL | Status: DC
  Administered 2016-11-18 – 2016-11-19 (×2): 12.5 mg via ORAL
  Filled 2016-11-18 (×2): qty 1

## 2016-11-18 MED ORDER — METOPROLOL TARTRATE 5 MG/5ML IV SOLN
5.0000 mg | Freq: Four times a day (QID) | INTRAVENOUS | Status: DC
  Filled 2016-11-18: qty 5

## 2016-11-18 MED ORDER — ALUM & MAG HYDROXIDE-SIMETH 200-200-20 MG/5ML PO SUSP: 15.0000 mL | ORAL | Status: DC | PRN

## 2016-11-18 MED ORDER — LIDOCAINE HCL (PF) 1 % IJ SOLN
INTRAMUSCULAR | Status: AC
  Filled 2016-11-18: qty 10

## 2016-11-18 MED ORDER — TIROFIBAN (AGGRASTAT) BOLUS VIA INFUSION
25.0000 ug/kg | Freq: Once | INTRAVENOUS | Status: AC
  Administered 2016-11-18: 2400 ug via INTRAVENOUS
  Filled 2016-11-18: qty 48

## 2016-11-18 MED ORDER — CEFAZOLIN SODIUM-DEXTROSE 2-4 GM/100ML-% IV SOLN
INTRAVENOUS | Status: AC
  Administered 2016-11-18: 2 g
  Filled 2016-11-18: qty 100

## 2016-11-18 MED ORDER — DOCUSATE SODIUM 100 MG PO CAPS
100.0000 mg | ORAL_CAPSULE | Freq: Every day | ORAL | Status: DC
  Administered 2016-11-19: 100 mg via ORAL
  Filled 2016-11-18: qty 1

## 2016-11-18 MED ORDER — POLYETHYLENE GLYCOL 3350 17 G PO PACK: 17.0000 g | PACK | Freq: Every day | ORAL | Status: DC | PRN

## 2016-11-18 MED ORDER — CLOPIDOGREL BISULFATE 75 MG PO TABS
75.0000 mg | ORAL_TABLET | Freq: Every day | ORAL | Status: DC
  Administered 2016-11-19: 75 mg via ORAL
  Filled 2016-11-18: qty 1

## 2016-11-18 MED ORDER — MIDAZOLAM HCL 2 MG/2ML IJ SOLN
INTRAMUSCULAR | Status: DC | PRN
  Administered 2016-11-18: 1 mg via INTRAVENOUS
  Administered 2016-11-18: 2 mg via INTRAVENOUS
  Administered 2016-11-18: 1 mg via INTRAVENOUS
  Administered 2016-11-18: 0.5 mg via INTRAVENOUS

## 2016-11-18 MED ORDER — FENTANYL CITRATE (PF) 100 MCG/2ML IJ SOLN
INTRAMUSCULAR | Status: DC | PRN
  Administered 2016-11-18 (×2): 50 ug via INTRAVENOUS
  Administered 2016-11-18: 25 ug via INTRAVENOUS
  Administered 2016-11-18: 50 ug via INTRAVENOUS

## 2016-11-18 MED ORDER — FENTANYL CITRATE (PF) 100 MCG/2ML IJ SOLN
INTRAMUSCULAR | Status: AC
  Filled 2016-11-18: qty 2

## 2016-11-18 MED ORDER — HEPARIN SODIUM (PORCINE) 1000 UNIT/ML IJ SOLN
INTRAMUSCULAR | Status: AC
  Filled 2016-11-18: qty 1

## 2016-11-18 MED ORDER — HYDROMORPHONE HCL 1 MG/ML IJ SOLN: 1.0000 mg | Freq: Once | INTRAMUSCULAR | Status: DC | PRN

## 2016-11-18 MED ORDER — ACETAMINOPHEN 325 MG PO TABS: 325.0000 mg | ORAL_TABLET | ORAL | Status: DC | PRN

## 2016-11-18 MED ORDER — CLONIDINE HCL 0.1 MG PO TABS: 0.2000 mg | ORAL_TABLET | ORAL | Status: DC | PRN

## 2016-11-18 MED ORDER — ASPIRIN EC 81 MG PO TBEC
81.0000 mg | DELAYED_RELEASE_TABLET | Freq: Every day | ORAL | Status: DC
  Administered 2016-11-19: 81 mg via ORAL
  Filled 2016-11-18 (×3): qty 1

## 2016-11-18 MED ORDER — TIROFIBAN HCL IN NACL 5-0.9 MG/100ML-% IV SOLN
0.0900 ug/kg/min | INTRAVENOUS | Status: AC
  Filled 2016-11-18 (×3): qty 100

## 2016-11-18 MED ORDER — TIROFIBAN HCL IV 12.5 MG/250 ML
INTRAVENOUS | Status: AC
  Filled 2016-11-18: qty 250

## 2016-11-18 MED ORDER — ACETAMINOPHEN 325 MG RE SUPP
325.0000 mg | RECTAL | Status: DC | PRN
  Filled 2016-11-18: qty 2

## 2016-11-18 MED ORDER — METHYLPREDNISOLONE SODIUM SUCC 125 MG IJ SOLR: 125.0000 mg | INTRAMUSCULAR | Status: DC | PRN

## 2016-11-18 MED ORDER — OXYCODONE HCL 5 MG PO TABS: 5.0000 mg | ORAL_TABLET | ORAL | Status: DC | PRN

## 2016-11-18 MED ORDER — SODIUM CHLORIDE 0.9 % IV SOLN
INTRAVENOUS | Status: DC | PRN
  Administered 2016-11-18: 250 mL via INTRAVENOUS

## 2016-11-18 MED ORDER — SODIUM CHLORIDE 0.9 % IV SOLN
INTRAVENOUS | Status: DC
  Administered 2016-11-18: 07:00:00 via INTRAVENOUS

## 2016-11-18 MED ORDER — LISINOPRIL 10 MG PO TABS
20.0000 mg | ORAL_TABLET | Freq: Every day | ORAL | Status: DC
  Administered 2016-11-19: 20 mg via ORAL
  Filled 2016-11-18: qty 2

## 2016-11-18 SURGICAL SUPPLY — 28 items
BALLN DORADO 4X200X135 (BALLOONS) ×3
BALLN DORADO 5X200X135 (BALLOONS) ×3
BALLN LUTONIX 4X150X130 (BALLOONS) ×9
BALLN ULTRVRSE 3X300X150 (BALLOONS) ×1
BALLN ULTRVRSE 3X300X150 OTW (BALLOONS) ×2
BALLOON DORADO 4X200X135 (BALLOONS) ×2 IMPLANT
BALLOON DORADO 5X200X135 (BALLOONS) ×2 IMPLANT
BALLOON LUTONIX 4X150X130 (BALLOONS) ×6 IMPLANT
BALLOON ULTRVRSE 3X300X150 OTW (BALLOONS) ×2 IMPLANT
CATH CXI SUPP ST 4FR 135CM (MICROCATHETER) ×3 IMPLANT
CATH RIM 65CM (CATHETERS) ×3 IMPLANT
COIL 400 COMPLEX SOFT 8X35CM (Vascular Products) ×3 IMPLANT
COIL 400 COMPLEX STD 12X40CM (Vascular Products) ×3 IMPLANT
COIL 400 COMPLEX STD 8X25CM (Vascular Products) ×3 IMPLANT
DEVICE PRESTO INFLATION (MISCELLANEOUS) ×3 IMPLANT
DEVICE STARCLOSE SE CLOSURE (Vascular Products) ×3 IMPLANT
DEVICE TORQUE (MISCELLANEOUS) ×3 IMPLANT
GLIDECATH F4/38/100ST (CATHETERS) ×3 IMPLANT
GLIDEWIRE ADV .035X260CM (WIRE) ×3 IMPLANT
GLIDEWIRE ANGLED SS 035X260CM (WIRE) ×3 IMPLANT
HANDLE DETACHMENT COIL (MISCELLANEOUS) ×3 IMPLANT
NEEDLE ENTRY 21GA 7CM ECHOTIP (NEEDLE) ×3 IMPLANT
PACK ANGIOGRAPHY (CUSTOM PROCEDURE TRAY) ×3 IMPLANT
SET INTRO CAPELLA COAXIAL (SET/KITS/TRAYS/PACK) ×3 IMPLANT
SHEATH ANL2 6FRX45 HC (SHEATH) ×3 IMPLANT
SHEATH BRITE TIP 5FRX11 (SHEATH) ×3 IMPLANT
WIRE G V18X300CM (WIRE) ×3 IMPLANT
WIRE J 3MM .035X145CM (WIRE) ×3 IMPLANT

## 2016-11-18 NOTE — Progress Notes (Signed)
Left groin dressing saturated with bright red blood on handoff report.  Previous RN notified Dr Lucky Cowboy.  Orders given to hold Aggrastat for 1 hour then reduce rate by one half.  Orders entered and carried out.  Oozing to left groin stopped after pressure held for 5 minutes.

## 2016-11-18 NOTE — H&P (Signed)
[facility] History & Physical Update  The patient was interviewed and re-examined.  The patient's previous History and Physical has been reviewed and is unchanged.  There is no change in the plan of care. We plan to proceed with the scheduled procedure.  [name], MD  11/18/2016, 7:57 AM

## 2016-11-18 NOTE — Op Note (Signed)
[facility] Percutaneous Study/Intervention Procedural Note   Date of Surgery: 11/18/2016  Surgeon:  Katha Cabal, MD.  Pre-operative Diagnosis: Atherosclerotic occlusive disease bilateral lower extremities with lifestyle limiting claudication of the right lower extremity; stenosis of femoral to popliteal in situ vein graft threatening graft patency  Post-operative diagnosis: Atherosclerotic occlusive disease bilateral lower extremities with lifestyle limiting claudication of the right lower extremity; occlusion of femoral to popliteal in situ vein graft with recurrence of his claudication symptoms  Procedure(s) Performed: 1. Introduction catheter into right lower extremity 3rd order catheter placement  2. Contrast injection right lower extremity for distal runoff   3. Percutaneous transluminal angioplasty  right femoral to popliteal bypass in situ vein graft and the mid popliteal artery with a Lutonix drug-eluting balloon 4. Coil embolization with the coils large branch of the in situ bypass graft              5.  Star close closure left common femoral arteriotomy  Anesthesia: Conscious sedation was administered under my direct supervision by the interventional radiology RN. IV Versed plus fentanyl were utilized. Continuous ECG, pulse oximetry and blood pressure was monitored throughout the entire procedure.  Conscious sedation was for a total of 2 hours and 12 minutes.  Sheath: 6 [place] Ansell left common femoral artery  Contrast: 80 cc  Fluoroscopy Time: 19.6 minutes  Indications: Joshua [name] presents with recurrence of his leg pain status post bypass grafting. Approximately 2 months ago he underwent a right femoral to below-knee popliteal bypass graft using in situ saphenous vein. He presented back to the office noting worsening pain with ambulation. Noninvasive studies including ABIs and  proximal ultrasound demonstrated a high-grade stricture in the midportion of the graft area he is therefore undergoing angiography with intervention for salvage of his graft to prevent limb loss in the future. The risks and benefits are reviewed all questions answered patient agrees to proceed.  Procedure: Joshua [name] is a 61 y.o. y.o. male who was identified and appropriate procedural time out was performed. The patient was then placed supine on the table and prepped and draped in the usual sterile fashion.   Ultrasound was placed in the sterile sleeve and the left groin was evaluated the left common femoral artery was echolucent and pulsatile indicating patency.  Image was recorded for the permanent record and under real-time visualization a microneedle was inserted into the common femoral artery microwire followed by a micro-sheath.  A J-wire was then advanced through the micro-sheath and a  5 [place] sheath was then inserted over a J-wire. J-wire was then advanced and a 5 French rim catheter was positioned at the level of the aortic bifurcation. AP projection of the aorta was then obtained. Rim catheter was repositioned and a LAO view of the pelvis was obtained.  Subsequently a rim catheter with the stiff angle Glidewire was used to cross the aortic bifurcation the catheter wire were advanced down into the right distal external iliac artery. Oblique view of the femoral bifurcation was then obtained and subsequently the wire was reintroduced and the pigtail catheter negotiated into the SFA representing third order catheter placement. Distal runoff was then performed.  5000 units of heparin was then given and allowed to circulate and a 6 [place] Ansell sheath was advanced up and over the bifurcation and positioned in the femoral artery  4 French straight glide  catheter and stiff angle Glidewire were then negotiated into the vein bypass which was patent  for its first 20 cm.  At this point a very  large branch takes off filling the deep venous system. Distally there is reconstitution of the below-knee popliteal at the level of the tibial plateau with 2 vessel runoff to the foot via peroneal and posterior tibial.  Using a combination of the angle Glidewire and an advantage wire in conjunction with a CXI catheter the entire length of the occluded vein bypass was crossed and hand injection of contrast was performed in the mid popliteal verifying intraluminal positioning.  A V-18 wire was then reintroduced and a 3 mm x 30 cm all traverse balloon was used to angioplasty the vein bypass and popliteal artery. Inflations were to 14 atmospheres for 2 minutes. Follow-up imaging demonstrated patency with more than 50% residual stenosis. Therefore, a 4 mm x 20 cm Dorado balloon was advanced and beginning in the mid popliteal serial inflations were performed along the course of the vein. Following this there was again marked improvement and I elected to perform angioplasty with the 4 mm Lutonix balloons three 4 mm x 15 cm Lutonix balloons were required antroplasty was to 14 atm for 2 minutes each. Follow-up imaging demonstrated there were still several areas of residual stenosis that was greater than 50% and therefore a 5 mm x 20 cm Dorado balloon was used to angioplasty the length of the vein graft. This included the mid portion of the popliteal over approximately 2 cm of distance. Inflation was to 810 atm for 2 minutes. Follow-up imaging now demonstrated rapid flow of contrast through the vein bypass there is one area distally that appears to have a small amount of thrombus but this is appears to be less than 30% there is rapid flow I elected to place the patient on Aggrastat rather than place a stent in this area as there does not appear to be any flow limitation. Distal runoff was then reassessed and found to be unchanged.  Next, the straight glide catheter was reintroduced and the angle Glidewire was then utilized  to access the large branch that was notable proximally a total of 3 coils were then placed and this branch all 3 were be coils the first to 8 mm in diameter the third was a 12 mm x 40 will be coils. Follow-up imaging demonstrated cessation of flow through this branch.  After review of these images the sheath is pulled into the left external iliac oblique of the common femoral is obtained and a Star close device deployed. There no immediate complications.   Findings: The common and external iliac arteries are widely patent bilaterally.  The right common femoral is widely patent as is the profunda femoris.  The SFA is occluded as previously documented. The vein bypass is patent in its proximal portion for approximately 20 cm but does indeed have a significant stenosis , actually and occlusion of its distal two thirds.  The distal popliteal demonstrates patency with right patency of the tibioperoneal trunk and peroneal and posterior tibial. The anterior tibial is occluded throughout its course. This is unchanged from previous studies.   Once the vein bypass was successfully negotiated angioplasty up to 5 mm in diameter including angioplasty with Lutonix drug-eluting balloons was performed with an excellent result. As noted above a small area of thrombus which appears to be nonflow limiting is noted and I will maintain him on Aggrastat overnight there is less than 30% residual stenosis at long the entire length of the vein graft and less than 5%  residual stenosis with the exception of this one area noted.  Following coil embolization there is successful elimination of this branch from the vein bypass therefore increasing distal perfusion through the vein bypass.   Summary: Successful recanalization right lower extremity for limb salvage    Disposition: Patient was taken to the recovery room in stable condition having tolerated the procedure well.  Schnier, Dolores Lory 11/18/2016,10:37 AM

## 2016-11-18 NOTE — Plan of Care (Signed)
Problem: Cardiovascular: Goal: Vascular access site(s) Level 0-1 will be maintained Outcome: Progressing Star closure device used to LEFT groin/ site clean and dry/ pt informed to let nurse know of any bleeding or pain at site  Problem: Education: Goal: Understanding of CV disease, CV risk reduction, and recovery process will improve Outcome: Progressing Pt informed not to sit up until 1:15pm/ verbalized an understanding

## 2016-11-19 ENCOUNTER — Encounter: Payer: Self-pay | Admitting: Vascular Surgery

## 2016-11-19 LAB — BASIC METABOLIC PANEL
Anion gap: 4 — ABNORMAL LOW (ref 5–15)
BUN: 17 mg/dL (ref 6–20)
CALCIUM: 9 mg/dL (ref 8.9–10.3)
CO2: 31 mmol/L (ref 22–32)
Chloride: 107 mmol/L (ref 101–111)
Creatinine, Ser: 1.05 mg/dL (ref 0.61–1.24)
GFR calc non Af Amer: >60 mL/min (ref >60)
GLUCOSE: 102 mg/dL — AB (ref 65–99)
Potassium: 4.2 mmol/L (ref 3.5–5.1)
Sodium: 142 mmol/L (ref 135–145)

## 2016-11-19 LAB — CBC
HCT: 39.2 % — ABNORMAL LOW (ref 40.0–52.0)
Hemoglobin: 12.8 g/dL — ABNORMAL LOW (ref 13.0–18.0)
MCH: 27.7 pg (ref 26.0–34.0)
MCHC: 32.7 g/dL (ref 32.0–36.0)
MCV: 84.7 fL (ref 80.0–100.0)
PLATELETS: 154 10*3/uL (ref 150–440)
RBC: 4.63 MIL/uL (ref 4.40–5.90)
RDW: 14.1 % (ref 11.5–14.5)
WBC: 5.1 10*3/uL (ref 3.8–10.6)

## 2016-11-19 MED ORDER — APIXABAN 5 MG PO TABS
5.0000 mg | ORAL_TABLET | Freq: Two times a day (BID) | ORAL | Status: DC
  Administered 2016-11-19: 5 mg via ORAL
  Filled 2016-11-19: qty 1

## 2016-11-19 NOTE — Progress Notes (Signed)
Discharge instructions explained to pt / verbalized an understanding/ iv and tele removed/ will transport off unit via wheelchair when ride arrives.  

## 2016-11-19 NOTE — Plan of Care (Signed)
Problem: Tissue Perfusion: Goal: Risk factors for ineffective tissue perfusion will decrease Outcome: Progressing Bilateral lower extremities warm to touch with sensation noted.  Pedal pulses without change from initial shift assessment.  No further bleed noted from left groin site.

## 2016-11-19 NOTE — Plan of Care (Signed)
Problem: Activity: Goal: Ability to return to baseline activity level will improve Outcome: Completed/Met Date Met: 11/19/16 No reports of pain in leg at rest/ pt able to stand independently   Problem: Cardiovascular: Goal: Vascular access site(s) Level 0-1 will be maintained Outcome: Completed/Met Date Met: 11/19/16 No active bleeding noted to site  Problem: Education: Goal: Understanding of CV disease, CV risk reduction, and recovery process will improve Outcome: Completed/Met Date Met: 11/19/16 Pt informed to take medications as prescribed    

## 2016-11-19 NOTE — Progress Notes (Signed)
Patient told Chaplain that he has been in hospital for two weeks. Patient said that blood-thiner is taking is helping him and he hopes to go home soon.   11/19/16 1000  Clinical Encounter Type  Visited With Patient  Visit Type Initial  Referral From Other (Comment)  Spiritual Encounters  Spiritual Needs Prayer

## 2016-11-19 NOTE — Discharge Summary (Signed)
[facility]    Discharge Summary    Patient ID:  Joshua [name] MRN: [id number] DOB/AGE: 01/09/1956 61 y.o.  Admit date: 11/18/2016 Discharge date: 11/19/2016 Date of Surgery: 11/18/2016 Surgeon: Surgeon(s): Katha Cabal, MD  Admission Diagnosis: Atherosclerosis of lower extremity with claudication [facility]) [I70.219]  Discharge Diagnoses:  Atherosclerosis of lower extremity with claudication ([place]) [I70.219]  Secondary Diagnoses: Past Medical History:  Diagnosis Date  . Dyspnea   . Hyperlipidemia   . Hypertension   . Peripheral vascular disease ([place])   . Pre-diabetes     Procedure(s): Lower Extremity Angiography Lower Extremity Intervention  Discharged Condition: good  HPI:  The patient presented yesterday for revascularization of his right lower extremity. He was found to have occlusion of the distal two thirds of his in situ bypass graft. This was re-cannulated successfully. He was maintained on Aggrastat overnight. Today he has a palpable pulse in his graft at the level of the knee. He will be started back on his Eliquis. He is discharged in improved condition  Hospital Course:  Joshua [name] is a 61 y.o. male is S/P Right Procedure(s): Lower Extremity Angiography Lower Extremity Intervention Extubated: POD # 0 Physical exam: Palpable pulse at the level of the knee in the in situ bypass graft Post-op wounds clean, dry, intact or healing well Pt. Ambulating, voiding and taking PO diet without difficulty. Pt pain controlled with PO pain meds. Labs as below Complications:none  Consults:    Significant Diagnostic Studies: CBC Lab Results  Component Value Date   WBC 5.1 11/19/2016   HGB 12.8 (L) 11/19/2016   HCT 39.2 (L) 11/19/2016   MCV 84.7 11/19/2016   PLT 154 11/19/2016    BMET    Component Value Date/Time   NA 142 11/19/2016 0513   K 4.2 11/19/2016 0513   CL 107 11/19/2016 0513   CO2 31 11/19/2016 0513   GLUCOSE 102 (H) 11/19/2016 0513   BUN 17 11/19/2016 0513   BUN 16 07/03/2014 1205   CREATININE 1.05 11/19/2016 0513   CREATININE 1.34 (H) 07/03/2014 1205   CALCIUM 9.0 11/19/2016 0513   GFRNONAA >60 11/19/2016 0513   GFRNONAA 59 (L) 07/03/2014 1205   GFRAA >60 11/19/2016 0513   GFRAA >60 07/03/2014 1205   COAG Lab Results  Component Value Date   INR 1.11 06/25/2016   INR 1.90 05/12/2016   INR 1.20 12/05/2015     Disposition:  Discharge to :Home Discharge Instructions    Call MD for:  redness, tenderness, or signs of infection (pain, swelling, bleeding, redness, odor or green/yellow discharge around incision site)    Complete by:  As directed    Call MD for:  severe or increased pain, loss or decreased feeling  in affected limb(s)    Complete by:  As directed    Call MD for:  temperature >100.5    Complete by:  As directed    Discharge instructions    Complete by:  As directed    OK to shower   Driving Restrictions    Complete by:  As directed    OK for driving   Resume previous diet    Complete by:  As directed      Allergies as of 11/19/2016   No Known Allergies     Medication List    TAKE these medications   acetaminophen 500 MG tablet Commonly known as:  TYLENOL Take 1,000 mg by mouth every 6 (six) hours as needed for  mild pain (for pain).   aspirin EC 81 MG tablet Take 81 mg by mouth daily.   ELIQUIS 5 MG Tabs tablet Generic drug:  apixaban TAKE 1 TABLET (5 MG) BY MOUTH TWICE DAILY   hydrochlorothiazide 12.5 MG tablet Commonly known as:  HYDRODIURIL TAKE 1 TABLET(12.5 MG) BY MOUTH EVERY DAY   lisinopril 20 MG tablet Commonly known as:  PRINIVIL,ZESTRIL Take 20 mg by mouth daily.   simvastatin 20 MG tablet Commonly known as:  ZOCOR TAKE 1 TABLET BY MOUTH DAILY      Verbal and written Discharge instructions given to the patient. Wound care per Discharge AVS Follow-up Information    [name], MD In 1 week.   Specialties:  Vascular  Surgery, Cardiology, Radiology, Vascular Surgery Why:  with right leg arterial ultrasound. Please follow up with Dr. [name] on Thursday May 10th at 4:30pm! jb Contact information: [place] [address] [phone]           Signed: [name], MD  11/19/2016, 5:33 PM

## 2016-11-20 ENCOUNTER — Telehealth (INDEPENDENT_AMBULATORY_CARE_PROVIDER_SITE_OTHER): Payer: Self-pay | Admitting: Vascular Surgery

## 2016-11-20 NOTE — Telephone Encounter (Signed)
LMVM FOR PATIENT TO CALLBACK AND SCHEDULE ASAP RLE ART DUP SEE GS.

## 2016-11-24 ENCOUNTER — Other Ambulatory Visit (INDEPENDENT_AMBULATORY_CARE_PROVIDER_SITE_OTHER): Payer: Self-pay | Admitting: Vascular Surgery

## 2016-11-24 ENCOUNTER — Other Ambulatory Visit (INDEPENDENT_AMBULATORY_CARE_PROVIDER_SITE_OTHER): Payer: BLUE CROSS/BLUE SHIELD

## 2016-11-24 DIAGNOSIS — I70311 Atherosclerosis of unspecified type of bypass graft(s) of the extremities with intermittent claudication, right leg: Secondary | ICD-10-CM

## 2016-11-27 ENCOUNTER — Encounter (INDEPENDENT_AMBULATORY_CARE_PROVIDER_SITE_OTHER): Payer: Self-pay | Admitting: Vascular Surgery

## 2016-11-27 ENCOUNTER — Ambulatory Visit (INDEPENDENT_AMBULATORY_CARE_PROVIDER_SITE_OTHER): Payer: BLUE CROSS/BLUE SHIELD | Admitting: Vascular Surgery

## 2016-11-27 VITALS — BP 117/67 | HR 72 | Resp 16 | Wt 210.0 lb

## 2016-11-27 DIAGNOSIS — M79604 Pain in right leg: Secondary | ICD-10-CM

## 2016-11-27 DIAGNOSIS — I70219 Atherosclerosis of native arteries of extremities with intermittent claudication, unspecified extremity: Secondary | ICD-10-CM

## 2016-11-27 DIAGNOSIS — I743 Embolism and thrombosis of arteries of the lower extremities: Secondary | ICD-10-CM

## 2016-11-27 NOTE — Progress Notes (Signed)
MRN : [id number]  Joshua [name] is a 61 y.o. (May 30, 1956) male who presents with chief complaint of  Chief Complaint  Patient presents with  . Follow-up  .  History of Present Illness: The patient returns to the office for followup and review of the noninvasive studies.  No new ulcers or wounds have occurred since the last visit.  He continues to have short distance claudication and pain in his right leg.  There have been no significant changes to the patient's overall health care.  The patient denies amaurosis fugax or recent TIA symptoms. There are no recent neurological changes noted. The patient denies history of DVT, PE or superficial thrombophlebitis. The patient denies recent episodes of angina or shortness of breath.   Duplex US shows a >50% narrowing in the distal bypass with marked increase in velocities.   Current Meds  Medication Sig  . acetaminophen (TYLENOL) 500 MG tablet Take 1,000 mg by mouth every 6 (six) hours as needed for mild pain (for pain).   Marland Kitchen aspirin EC 81 MG tablet Take 81 mg by mouth daily.  Marland Kitchen ELIQUIS 5 MG TABS tablet TAKE 1 TABLET (5 MG) BY MOUTH TWICE DAILY  . hydrochlorothiazide (HYDRODIURIL) 12.5 MG tablet TAKE 1 TABLET(12.5 MG) BY MOUTH EVERY DAY  . lisinopril (PRINIVIL,ZESTRIL) 20 MG tablet Take 20 mg by mouth daily.  . simvastatin (ZOCOR) 20 MG tablet TAKE 1 TABLET BY MOUTH DAILY    Past Medical History:  Diagnosis Date  . Dyspnea   . Hyperlipidemia   . Hypertension   . Peripheral vascular disease (Joshua [name])   . Pre-diabetes     Past Surgical History:  Procedure Laterality Date  . BACK SURGERY  2011  . CENTRAL LINE INSERTION Right 09/10/2016   Procedure: CENTRAL LINE INSERTION;  Surgeon: Katha Cabal, MD;  Location: [facility];  Service: Vascular;  Laterality: Right;  . COLONOSCOPY  2013 ?  . FEMORAL-POPLITEAL BYPASS GRAFT Right 09/10/2016   Procedure: BYPASS GRAFT FEMORAL-POPLITEAL ARTERY ( BELOW KNEE );  Surgeon: Katha Cabal, MD;  Location: [facility];  Service: Vascular;  Laterality: Right;  . LOWER EXTREMITY ANGIOGRAPHY Right 11/18/2016   Procedure: Lower Extremity Angiography;  Surgeon: Katha Cabal, MD;  Location: [place] [facility];  Service: Cardiovascular;  Laterality: Right;  . LOWER EXTREMITY INTERVENTION  11/18/2016   Procedure: Lower Extremity Intervention;  Surgeon: Katha Cabal, MD;  Location: [place] [facility];  Service: Cardiovascular;;  . PERIPHERAL VASCULAR CATHETERIZATION Right 01/02/2015   Procedure: Lower Extremity Angiography;  Surgeon: Katha Cabal, MD;  Location: [place] [facility];  Service: Cardiovascular;  Laterality: Right;  . PERIPHERAL VASCULAR CATHETERIZATION Right 06/18/2015   Procedure: Lower Extremity Angiography;  Surgeon: Algernon Huxley, MD;  Location: [place] [facility];  Service: Cardiovascular;  Laterality: Right;  . PERIPHERAL VASCULAR CATHETERIZATION  06/18/2015   Procedure: Lower Extremity Intervention;  Surgeon: Algernon Huxley, MD;  Location: [place] [facility];  Service: Cardiovascular;;  . PERIPHERAL VASCULAR CATHETERIZATION N/A 10/09/2015   Procedure: Abdominal Aortogram w/Lower Extremity;  Surgeon: Katha Cabal, MD;  Location: [place] [facility];  Service: Cardiovascular;  Laterality: N/A;  . PERIPHERAL VASCULAR CATHETERIZATION  10/09/2015   Procedure: Lower Extremity Intervention;  Surgeon: Katha Cabal, MD;  Location: [place] [facility];  Service: Cardiovascular;;  . PERIPHERAL VASCULAR CATHETERIZATION Right 10/10/2015   Procedure: Lower Extremity Angiography;  Surgeon: Algernon Huxley, MD;  Location: [place] [facility];  Service: Cardiovascular;  Laterality: Right;  . PERIPHERAL VASCULAR CATHETERIZATION Right 10/10/2015   Procedure: Lower Extremity Intervention;  Surgeon: Algernon Huxley, MD;  Location: [place] [facility];  Service: Cardiovascular;  Laterality: Right;  . PERIPHERAL VASCULAR CATHETERIZATION Right 12/03/2015   Procedure: Lower  Extremity Angiography;  Surgeon: Algernon Huxley, MD;  Location: [place] [facility];  Service: Cardiovascular;  Laterality: Right;  . PERIPHERAL VASCULAR CATHETERIZATION Right 12/04/2015   Procedure: Lower Extremity Angiography;  Surgeon: Algernon Huxley, MD;  Location: [place] [facility];  Service: Cardiovascular;  Laterality: Right;  . PERIPHERAL VASCULAR CATHETERIZATION  12/04/2015   Procedure: Lower Extremity Intervention;  Surgeon: Algernon Huxley, MD;  Location: [place] [facility];  Service: Cardiovascular;;  . PERIPHERAL VASCULAR CATHETERIZATION Right 06/30/2016   Procedure: Lower Extremity Angiography;  Surgeon: Algernon Huxley, MD;  Location: [place] [facility];  Service: Cardiovascular;  Laterality: Right;  . PERIPHERAL VASCULAR CATHETERIZATION Right 06/25/2016   Procedure: Lower Extremity Angiography;  Surgeon: Katha Cabal, MD;  Location: [place] [facility];  Service: Cardiovascular;  Laterality: Right;  . PERIPHERAL VASCULAR CATHETERIZATION Right 07/01/2016   Procedure: Lower Extremity Angiography;  Surgeon: Katha Cabal, MD;  Location: [place] [facility];  Service: Cardiovascular;  Laterality: Right;  . PERIPHERAL VASCULAR CATHETERIZATION  07/01/2016   Procedure: Lower Extremity Intervention;  Surgeon: Katha Cabal, MD;  Location: [place] [facility];  Service: Cardiovascular;;  . PERIPHERAL VASCULAR CATHETERIZATION Right 08/01/2016   Procedure: Lower Extremity Angiography;  Surgeon: Katha Cabal, MD;  Location: [place] [facility];  Service: Cardiovascular;  Laterality: Right;  . stent placement in right leg Right     Social History Social History  Substance Use Topics  . Smoking status: Former Smoker    Packs/day: 1.50    Types: Cigarettes    Quit date: 12/20/2014  . Smokeless tobacco: Former Systems developer  . Alcohol use No    Family History No family history on file.  No Known Allergies   REVIEW OF SYSTEMS (Negative unless checked)  Constitutional: '[]'$ Weight  loss  '[]'$ Fever  '[]'$ Chills Cardiac: '[]'$ Chest pain   '[]'$ Chest pressure   '[]'$ Palpitations   '[]'$ Shortness of breath when laying flat   '[]'$ Shortness of breath with exertion. Vascular:  '[x]'$ Pain in legs with walking   '[x]'$ Pain in legs at rest  '[]'$ History of DVT   '[]'$ Phlebitis   '[]'$ Swelling in legs   '[]'$ Varicose veins   '[]'$ Non-healing ulcers Pulmonary:   '[]'$ Uses home oxygen   '[]'$ Productive cough   '[]'$ Hemoptysis   '[]'$ Wheeze  '[]'$ COPD   '[]'$ Asthma Neurologic:  '[]'$ Dizziness   '[]'$ Seizures   '[]'$ History of stroke   '[]'$ History of TIA  '[]'$ Aphasia   '[]'$ Vissual changes   '[]'$ Weakness or numbness in arm   '[]'$ Weakness or numbness in leg Musculoskeletal:   '[]'$ Joint swelling   '[]'$ Joint pain   '[]'$ Low back pain Hematologic:  '[]'$ Easy bruising  '[]'$ Easy bleeding   '[]'$ Hypercoagulable state   '[]'$ Anemic Gastrointestinal:  '[]'$ Diarrhea   '[]'$ Vomiting  '[]'$ Gastroesophageal reflux/heartburn   '[]'$ Difficulty swallowing. Genitourinary:  '[]'$ Chronic kidney disease   '[]'$ Difficult urination  '[]'$ Frequent urination   '[]'$ Blood in urine Skin:  '[]'$ Rashes   '[]'$ Ulcers  Psychological:  '[]'$ History of anxiety   '[]'$  History of major depression.  Physical Examination  Vitals:   11/27/16 1635  BP: 117/67  Pulse: 72  Resp: 16  Weight: 95.3 kg (210 lb)   Body mass index is 28.48 kg/m. Gen: WD/WN, NAD Head: [place]/AT, No temporalis wasting.  Ear/Nose/Throat: Hearing grossly  intact, nares w/o erythema or drainage Eyes: PER, EOMI, sclera nonicteric.  Neck: Supple, no large masses.   Pulmonary:  Good air movement, no audible wheezing bilaterally, no use of accessory muscles.  Cardiac: RRR, no JVD Vascular:  2+ pulse in the bypass at the knee Vessel Right Left  Radial Palpable Palpable  Ulnar Palpable Palpable  Brachial Palpable Palpable  Carotid Palpable Palpable  Femoral Palpable Palpable  Popliteal Not Palpable Palpable  PT Trace Palpable Palpable  DP Not Palpable Palpable  Gastrointestinal: Non-distended. No guarding/no peritoneal signs.  Musculoskeletal: M/S 5/5 throughout.  No  deformity or atrophy.  Neurologic: CN 2-12 intact. Symmetrical.  Speech is fluent. Motor exam as listed above. Psychiatric: Judgment intact, Mood & affect appropriate for pt's clinical situation. Dermatologic: No rashes or ulcers noted.  No changes consistent with cellulitis. Lymph : No lichenification or skin changes of chronic lymphedema.  CBC Lab Results  Component Value Date   WBC 5.1 11/19/2016   HGB 12.8 (L) 11/19/2016   HCT 39.2 (L) 11/19/2016   MCV 84.7 11/19/2016   PLT 154 11/19/2016    BMET    Component Value Date/Time   NA 142 11/19/2016 0513   K 4.2 11/19/2016 0513   CL 107 11/19/2016 0513   CO2 31 11/19/2016 0513   GLUCOSE 102 (H) 11/19/2016 0513   BUN 17 11/19/2016 0513   BUN 16 07/03/2014 1205   CREATININE 1.05 11/19/2016 0513   CREATININE 1.34 (H) 07/03/2014 1205   CALCIUM 9.0 11/19/2016 0513   GFRNONAA >60 11/19/2016 0513   GFRNONAA 59 (L) 07/03/2014 1205   GFRAA >60 11/19/2016 0513   GFRAA >60 07/03/2014 1205   Estimated Creatinine Clearance: 91.9 mL/min (by C-G formula based on SCr of 1.05 mg/dL).  COAG Lab Results  Component Value Date   INR 1.11 06/25/2016   INR 1.90 05/12/2016   INR 1.20 12/05/2015    Radiology No results found.  Assessment/Plan 1. 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  Noninvasive studies suggest a lesion that threaten the bypass' patency  Given the severity of the patient's lower extremity symptoms the patient should undergo angiography and intervention.  Risk and benefits were reviewed the patient.  Indications for the procedure were reviewed.  All questions were answered, the patient agrees to proceed.   The patient should continue walking and begin a more formal exercise program.  The patient should continue antiplatelet therapy and aggressive treatment of the  lipid abnormalities  The patient will follow up with me after the angiogram.   2. Atherosclerosis of lower extremity with claudication ([place]) See #1  3. Pain of right lower extremity Will proceed with angiography    [name], MD  11/27/2016 8:29 PM

## 2016-11-28 ENCOUNTER — Encounter (INDEPENDENT_AMBULATORY_CARE_PROVIDER_SITE_OTHER): Payer: Self-pay

## 2016-11-30 ENCOUNTER — Other Ambulatory Visit (INDEPENDENT_AMBULATORY_CARE_PROVIDER_SITE_OTHER): Payer: Self-pay | Admitting: Vascular Surgery

## 2016-12-08 ENCOUNTER — Encounter
Admission: RE | Admit: 2016-12-08 | Discharge: 2016-12-08 | Disposition: A | Discharge status: Home / Self Care | Payer: BLUE CROSS/BLUE SHIELD | Source: Ambulatory Visit | Attending: Vascular Surgery | Admitting: Vascular Surgery

## 2016-12-08 DIAGNOSIS — R7303 Prediabetes: Secondary | ICD-10-CM | POA: Diagnosis not present

## 2016-12-08 DIAGNOSIS — Z951 Presence of aortocoronary bypass graft: Secondary | ICD-10-CM | POA: Diagnosis not present

## 2016-12-08 DIAGNOSIS — I70213 Atherosclerosis of native arteries of extremities with intermittent claudication, bilateral legs: Secondary | ICD-10-CM | POA: Diagnosis present

## 2016-12-08 DIAGNOSIS — Z87891 Personal history of nicotine dependence: Secondary | ICD-10-CM | POA: Diagnosis not present

## 2016-12-08 DIAGNOSIS — I10 Essential (primary) hypertension: Secondary | ICD-10-CM | POA: Diagnosis not present

## 2016-12-08 DIAGNOSIS — Z9889 Other specified postprocedural states: Secondary | ICD-10-CM | POA: Diagnosis not present

## 2016-12-08 DIAGNOSIS — E785 Hyperlipidemia, unspecified: Secondary | ICD-10-CM | POA: Diagnosis not present

## 2016-12-08 DIAGNOSIS — I743 Embolism and thrombosis of arteries of the lower extremities: Secondary | ICD-10-CM | POA: Diagnosis not present

## 2016-12-08 DIAGNOSIS — Z955 Presence of coronary angioplasty implant and graft: Secondary | ICD-10-CM | POA: Diagnosis not present

## 2016-12-08 LAB — CREATININE, SERUM
CREATININE: 1.29 mg/dL — AB (ref 0.61–1.24)
GFR calc Af Amer: >60 mL/min (ref >60)
GFR calc non Af Amer: 60 mL/min — ABNORMAL LOW (ref >60)

## 2016-12-08 LAB — BUN: BUN: 22 mg/dL — ABNORMAL HIGH (ref 6–20)

## 2016-12-08 MED ORDER — CEFAZOLIN SODIUM-DEXTROSE 1-4 GM/50ML-% IV SOLN: 1.0000 g | Freq: Once | INTRAVENOUS | Status: DC

## 2016-12-09 ENCOUNTER — Ambulatory Visit
Admission: RE | Admit: 2016-12-09 | Discharge: 2016-12-09 | Disposition: A | Discharge status: Home / Self Care | Payer: BLUE CROSS/BLUE SHIELD | Source: Ambulatory Visit | Attending: Vascular Surgery | Admitting: Vascular Surgery

## 2016-12-09 ENCOUNTER — Encounter
Admission: RE | Disposition: A | Discharge status: Home / Self Care | Payer: Self-pay | Source: Ambulatory Visit | Attending: Vascular Surgery

## 2016-12-09 DIAGNOSIS — I743 Embolism and thrombosis of arteries of the lower extremities: Secondary | ICD-10-CM | POA: Insufficient documentation

## 2016-12-09 DIAGNOSIS — Z87891 Personal history of nicotine dependence: Secondary | ICD-10-CM | POA: Insufficient documentation

## 2016-12-09 DIAGNOSIS — Z955 Presence of coronary angioplasty implant and graft: Secondary | ICD-10-CM | POA: Insufficient documentation

## 2016-12-09 DIAGNOSIS — Z951 Presence of aortocoronary bypass graft: Secondary | ICD-10-CM | POA: Insufficient documentation

## 2016-12-09 DIAGNOSIS — E785 Hyperlipidemia, unspecified: Secondary | ICD-10-CM | POA: Insufficient documentation

## 2016-12-09 DIAGNOSIS — I70211 Atherosclerosis of native arteries of extremities with intermittent claudication, right leg: Secondary | ICD-10-CM | POA: Diagnosis not present

## 2016-12-09 DIAGNOSIS — I70213 Atherosclerosis of native arteries of extremities with intermittent claudication, bilateral legs: Secondary | ICD-10-CM | POA: Diagnosis not present

## 2016-12-09 DIAGNOSIS — Z9889 Other specified postprocedural states: Secondary | ICD-10-CM | POA: Insufficient documentation

## 2016-12-09 DIAGNOSIS — R7303 Prediabetes: Secondary | ICD-10-CM | POA: Insufficient documentation

## 2016-12-09 DIAGNOSIS — I10 Essential (primary) hypertension: Secondary | ICD-10-CM | POA: Insufficient documentation

## 2016-12-09 HISTORY — PX: LOWER EXTREMITY ANGIOGRAPHY: CATH118251

## 2016-12-09 LAB — GLUCOSE, CAPILLARY: Glucose-Capillary: 100 mg/dL — ABNORMAL HIGH (ref 65–99)

## 2016-12-09 SURGERY — LOWER EXTREMITY ANGIOGRAPHY
Anesthesia: Moderate Sedation | Laterality: Right

## 2016-12-09 MED ORDER — ALUM & MAG HYDROXIDE-SIMETH 200-200-20 MG/5ML PO SUSP: 15.0000 mL | ORAL | Status: DC | PRN

## 2016-12-09 MED ORDER — ACETAMINOPHEN 325 MG PO TABS: 325.0000 mg | ORAL_TABLET | ORAL | Status: DC | PRN

## 2016-12-09 MED ORDER — NITROGLYCERIN 1 MG/10 ML FOR IR/CATH LAB
INTRA_ARTERIAL | Status: DC | PRN
  Administered 2016-12-09: 500 ug

## 2016-12-09 MED ORDER — MIDAZOLAM HCL 2 MG/2ML IJ SOLN
INTRAMUSCULAR | Status: DC | PRN
  Administered 2016-12-09: 2 mg via INTRAVENOUS
  Administered 2016-12-09: 1 mg via INTRAVENOUS

## 2016-12-09 MED ORDER — SODIUM CHLORIDE 0.9 % IV SOLN: 500.0000 mL | Freq: Once | INTRAVENOUS | Status: DC | PRN

## 2016-12-09 MED ORDER — CEFAZOLIN SODIUM-DEXTROSE 2-4 GM/100ML-% IV SOLN
2.0000 g | Freq: Once | INTRAVENOUS | Status: AC
  Administered 2016-12-09: 2 g via INTRAVENOUS

## 2016-12-09 MED ORDER — SODIUM CHLORIDE 0.9 % IJ SOLN
INTRAMUSCULAR | Status: AC
  Filled 2016-12-09: qty 50

## 2016-12-09 MED ORDER — LABETALOL HCL 5 MG/ML IV SOLN: 10.0000 mg | INTRAVENOUS | Status: DC | PRN

## 2016-12-09 MED ORDER — ACETAMINOPHEN 325 MG RE SUPP
325.0000 mg | RECTAL | Status: DC | PRN
  Filled 2016-12-09: qty 2

## 2016-12-09 MED ORDER — HEPARIN SODIUM (PORCINE) 1000 UNIT/ML IJ SOLN
INTRAMUSCULAR | Status: AC
  Filled 2016-12-09: qty 1

## 2016-12-09 MED ORDER — HYDRALAZINE HCL 20 MG/ML IJ SOLN: 5.0000 mg | INTRAMUSCULAR | Status: DC | PRN

## 2016-12-09 MED ORDER — ONDANSETRON HCL 4 MG/2ML IJ SOLN: 4.0000 mg | Freq: Four times a day (QID) | INTRAMUSCULAR | Status: DC | PRN

## 2016-12-09 MED ORDER — SODIUM CHLORIDE 0.9 % IV SOLN
INTRAVENOUS | Status: DC
  Administered 2016-12-09: 09:00:00 via INTRAVENOUS

## 2016-12-09 MED ORDER — FAMOTIDINE 20 MG PO TABS: 40.0000 mg | ORAL_TABLET | ORAL | Status: DC | PRN

## 2016-12-09 MED ORDER — NITROGLYCERIN 5 MG/ML IV SOLN
INTRAVENOUS | Status: AC
  Filled 2016-12-09: qty 10

## 2016-12-09 MED ORDER — HEPARIN SODIUM (PORCINE) 1000 UNIT/ML IJ SOLN
INTRAMUSCULAR | Status: DC | PRN
  Administered 2016-12-09: 5000 [IU] via INTRAVENOUS

## 2016-12-09 MED ORDER — PHENOL 1.4 % MT LIQD: 1.0000 | OROMUCOSAL | Status: DC | PRN

## 2016-12-09 MED ORDER — GUAIFENESIN-DM 100-10 MG/5ML PO SYRP: 15.0000 mL | ORAL_SOLUTION | ORAL | Status: DC | PRN

## 2016-12-09 MED ORDER — HYDROMORPHONE HCL 1 MG/ML IJ SOLN: 1.0000 mg | Freq: Once | INTRAMUSCULAR | Status: DC | PRN

## 2016-12-09 MED ORDER — SODIUM CHLORIDE 0.9 % IV SOLN: 1.0000 mL/kg/h | INTRAVENOUS | Status: DC

## 2016-12-09 MED ORDER — HEPARIN (PORCINE) IN NACL 2-0.9 UNIT/ML-% IJ SOLN
INTRAMUSCULAR | Status: AC
  Filled 2016-12-09: qty 1000

## 2016-12-09 MED ORDER — FENTANYL CITRATE (PF) 100 MCG/2ML IJ SOLN
INTRAMUSCULAR | Status: AC
Start: 2016-12-09 — End: 2016-12-09
  Filled 2016-12-09: qty 2

## 2016-12-09 MED ORDER — METHYLPREDNISOLONE SODIUM SUCC 125 MG IJ SOLR: 125.0000 mg | INTRAMUSCULAR | Status: DC | PRN

## 2016-12-09 MED ORDER — SODIUM CHLORIDE 0.9 % IV SOLN
INTRAVENOUS | Status: AC | PRN
  Administered 2016-12-09: 500 mL via INTRAVENOUS

## 2016-12-09 MED ORDER — FENTANYL CITRATE (PF) 100 MCG/2ML IJ SOLN
INTRAMUSCULAR | Status: AC
  Filled 2016-12-09: qty 2

## 2016-12-09 MED ORDER — HYDROMORPHONE HCL 1 MG/ML IJ SOLN: 0.5000 mg | INTRAMUSCULAR | Status: DC | PRN

## 2016-12-09 MED ORDER — METOPROLOL TARTRATE 5 MG/5ML IV SOLN: 2.0000 mg | INTRAVENOUS | Status: DC | PRN

## 2016-12-09 MED ORDER — LIDOCAINE HCL (PF) 1 % IJ SOLN
INTRAMUSCULAR | Status: AC
  Filled 2016-12-09: qty 30

## 2016-12-09 MED ORDER — CEFAZOLIN SODIUM-DEXTROSE 1-4 GM/50ML-% IV SOLN
INTRAVENOUS | Status: AC
  Filled 2016-12-09: qty 100

## 2016-12-09 MED ORDER — MIDAZOLAM HCL 5 MG/5ML IJ SOLN
INTRAMUSCULAR | Status: AC
  Filled 2016-12-09: qty 5

## 2016-12-09 MED ORDER — IOPAMIDOL (ISOVUE-300) INJECTION 61%
INTRAVENOUS | Status: DC | PRN
  Administered 2016-12-09: 40 mL via INTRA_ARTERIAL

## 2016-12-09 MED ORDER — FENTANYL CITRATE (PF) 100 MCG/2ML IJ SOLN
INTRAMUSCULAR | Status: DC | PRN
  Administered 2016-12-09: 50 ug via INTRAVENOUS
  Administered 2016-12-09: 100 ug via INTRAVENOUS

## 2016-12-09 MED ORDER — OXYCODONE HCL 5 MG PO TABS: 5.0000 mg | ORAL_TABLET | ORAL | Status: DC | PRN

## 2016-12-09 SURGICAL SUPPLY — 25 items
BALLN LUTONIX 5X150X130 (BALLOONS) ×2
BALLN LUTONIX DCB 4X60X130 (BALLOONS) ×2
BALLN ULTRVRSE 3X80X150 (BALLOONS) ×1
BALLN ULTRVRSE 3X80X150 OTW (BALLOONS) ×1
BALLOON LUTONIX 5X150X130 (BALLOONS) ×1 IMPLANT
BALLOON LUTONIX DCB 4X60X130 (BALLOONS) ×1 IMPLANT
BALLOON ULTRVRSE 3X80X150 OTW (BALLOONS) ×1 IMPLANT
CATH G STR 4FX120.038 (CATHETERS) ×2 IMPLANT
CATH PIG 70CM (CATHETERS) ×2 IMPLANT
COVER PROBE U/S 5X48 (MISCELLANEOUS) ×2 IMPLANT
DEVICE CLOSURE MYNXGRIP 6/7F (Vascular Products) ×2 IMPLANT
DEVICE PRESTO INFLATION (MISCELLANEOUS) ×2 IMPLANT
DEVICE TORQUE (MISCELLANEOUS) ×2 IMPLANT
GLIDEWIRE ANGLED SS 035X260CM (WIRE) ×2 IMPLANT
PACK ANGIOGRAPHY (CUSTOM PROCEDURE TRAY) ×2 IMPLANT
SET INTRO CAPELLA COAXIAL (SET/KITS/TRAYS/PACK) ×2 IMPLANT
SHEATH ANL2 6FRX45 HC (SHEATH) ×2 IMPLANT
SHEATH BRITE TIP 5FRX11 (SHEATH) ×2 IMPLANT
SHEATH BRITE TIP 6FRX11 (SHEATH) ×2 IMPLANT
SHIELD RADPAD SCOOP 12X17 (MISCELLANEOUS) ×2 IMPLANT
STENT VIABAHN 5X50X120 (Permanent Stent) ×2 IMPLANT
TOWEL OR 17X26 4PK STRL BLUE (TOWEL DISPOSABLE) ×2 IMPLANT
TUBING CONTRAST HIGH PRESS 72 (TUBING) ×2 IMPLANT
WIRE G.018X260 SHT (WIRE) ×2 IMPLANT
WIRE J 3MM .035X145CM (WIRE) ×2 IMPLANT

## 2016-12-09 NOTE — H&P (Signed)
[facility] History & Physical Update  The patient was interviewed and re-examined.  The patient's previous History and Physical has been reviewed and is unchanged.  There is no change in the plan of care. We plan to proceed with the scheduled procedure.  [name], MD  12/09/2016, 9:22 AM

## 2016-12-09 NOTE — Op Note (Signed)
[facility] Percutaneous Study/Intervention Procedural Note   Date of Surgery: 12/09/2016  Surgeon: [name]  Pre-operative Diagnosis: Atherosclerotic occlusive disease bilateral lower extremities with right lower extremity claudication and flow limiting stricture of the distal femoral below-knee popliteal bypass graft  Post-operative diagnosis: Same  Procedure(s) Performed: 1. Introduction catheter into right lower extremity 3rd order catheter placement  2. Contrast injection right lower extremity for distal runoff  3. Percutaneous transluminal angioplasty and stent placement with a Viabahn stent distal right femoral to below-knee popliteal vein bypass 4. Percutaneous transluminal angioplasty right posterior tibial artery  5. Mynx closure left common femoral arteriotomy  Anesthesia: Conscious sedation was administered under my direct supervision by the interventional radiology RN. IV Versed plus fentanyl were utilized. Continuous ECG, pulse oximetry and blood pressure was monitored throughout the entire procedure.  Conscious sedation was for a total of 1 hour 17 minutes.  Sheath: 6 [place] Ansell left common femoral artery  Contrast: 40 cc  Fluoroscopy Time: 6.4 minutes  Indications: Joshua [name] presents with increasing pain of the right lower extremity. Ultrasound demonstrates the distal vein bypasses a flow limiting stenosis and therefore the bypass itself is at risk for thrombosis. This suggests the patient is having limb threatening ischemia. The risks and benefits are reviewed all questions answered patient agrees to proceed.  Procedure:Joshua [name] is a 61 y.o. y.o. male who was identified and appropriate procedural time out was performed. The patient was then placed supine on the table and prepped and draped in the usual sterile fashion.   Ultrasound was placed  in the sterile sleeve and the left groin was evaluated the left common femoral artery was echolucent and pulsatile indicating patency. Image was recorded for the permanent record and under real-time visualization a microneedle was inserted into the common femoral artery followed by the microwire and then the micro-sheath. A J-wire was then advanced through the micro-sheath and a 5 [place] sheath was then inserted over a J-wire. J-wire was then advanced and a 5 French pigtail catheter was positioned at the level of L4.  This placed the pigtail just above the bifurcation and an LAO view of the pelvis was obtained. Subsequently a pigtail catheter with the stiff angle Glidewire was used to cross the aortic bifurcation the catheter wire were advanced down into the right distal external iliac artery. Oblique view of the femoral bifurcation was then obtained and subsequently the wire was reintroduced and the pigtail catheter and stiff angle Glidewire was negotiated into the vein bypass representing third order catheter placement. Distal runoff was then performed.  5000 units of heparin was then given and allowed to circulate and a 6 [place] Ansell sheath was advanced up and over the bifurcation and positioned in the femoral artery  Straight catheter and stiff angle Glidewire were then negotiated down into the distal popliteal. Catheter was then advanced. Hand injection contrast demonstrated the tibial anatomy in detail.  A 5 mm x 50 mm Viabahn stent was then deployed across the lesion in the distal vein bypass. A 4 mm x 60 mm Lutonix balloon was then used to angioplasty the distal anastomosis and distal edge of the stent. A 5 mm x 1 50 mm Lutonix balloon was then used angioplasty the distal bypass across the entire length of the stent.  Each inflation was for 2 minutes at 8 atm. Follow-up imaging demonstrated excellent patency with complete resolution of the lesion in the distal vein bypass.  The detector was then  repositioned and the trifurcation and tibial runoff was reimaged greater than 70% stenosis is noted in posterior tibial. A 3 mm x 8 cm ultra versed balloon was then advanced across the lesion Inflation was to 8 atmospheres for 2 minutes. Follow-up imaging demonstrated patency with excellent result. Distal runoff was then reassessed and noted to be widely patent.   After review of these images the sheath is pulled into the left external iliac oblique of the common femoral is obtained and a Mynx closure device deployed. There no immediate Complications.  Findings: The common and external iliac arteries are widely patent bilaterally.  The right common femoral is widely patent as is the profunda femoris.  The SFA is occluded the vein bypass is patent and does indeed have a significant stenosis distally just below the knee several centimeters proximal to the anastomosis.    Following angioplasty to 3 mm the posterior  tibial now is in-line flow and looks quite nice. Angioplasty and stenting of the distal vein bypass yields an excellent result with less than 5 % residual stenosis.  Summary: Successful recanalization right lower extremity for limb salvage and prevention of graft thrombosis   Disposition: Patient was taken to the recovery room in stable condition having tolerated the procedure well.  Belenda Cruise Schnier 12/09/2016,10:52 AM

## 2016-12-19 ENCOUNTER — Encounter (INDEPENDENT_AMBULATORY_CARE_PROVIDER_SITE_OTHER): Payer: BLUE CROSS/BLUE SHIELD

## 2017-01-02 ENCOUNTER — Other Ambulatory Visit (INDEPENDENT_AMBULATORY_CARE_PROVIDER_SITE_OTHER): Payer: Self-pay | Admitting: Vascular Surgery

## 2017-01-02 DIAGNOSIS — I70311 Atherosclerosis of unspecified type of bypass graft(s) of the extremities with intermittent claudication, right leg: Secondary | ICD-10-CM

## 2017-01-07 NOTE — Progress Notes (Signed)
MRN : [id number]  Joshua [name] is a 61 y.o. (01-Aug-1955) male who presents with chief complaint of No chief complaint on file. Marland Kitchen  History of Present Illness: The patient returns to the office for followup and review status post angiogram with intervention. The patient notes improvement in the lower extremity symptoms. No interval shortening of the patient's claudication distance or rest pain symptoms. Previous wounds have now healed.  No new ulcers or wounds have occurred since the last visit.  There have been no significant changes to the patient's overall health care.  The patient denies amaurosis fugax or recent TIA symptoms. There are no recent neurological changes noted. The patient denies history of DVT, PE or superficial thrombophlebitis. The patient denies recent episodes of angina or shortness of breath.   (previous ABI's Rt=0.42 and Lt=1.11) Duplex US of the right lower extremity arterial system shows widely patent bypass with triphasic signals at the ankle.  Previous stricture is resolved s/p intervention   No outpatient prescriptions have been marked as taking for the 01/08/17 encounter (Appointment) with Delana Meyer, Dolores Lory, MD.    Past Medical History:  Diagnosis Date  . Dyspnea    former smoker. only doe  . Hyperlipidemia   . Hypertension   . Peripheral vascular disease ([place])   . Pre-diabetes     Past Surgical History:  Procedure Laterality Date  . BACK SURGERY  2011  . CENTRAL LINE INSERTION Right 09/10/2016   Procedure: CENTRAL LINE INSERTION;  Surgeon: Katha Cabal, MD;  Location: [facility];  Service: Vascular;  Laterality: Right;  . COLONOSCOPY  2013 ?  . FEMORAL-POPLITEAL BYPASS GRAFT Right 09/10/2016   Procedure: BYPASS GRAFT FEMORAL-POPLITEAL ARTERY ( BELOW KNEE );  Surgeon: Katha Cabal, MD;  Location: [facility];  Service: Vascular;  Laterality: Right;  . LOWER EXTREMITY ANGIOGRAPHY Right 11/18/2016   Procedure: Lower Extremity Angiography;   Surgeon: Katha Cabal, MD;  Location: [facility];  Service: Cardiovascular;  Laterality: Right;  . LOWER EXTREMITY ANGIOGRAPHY Right 12/09/2016   Procedure: Lower Extremity Angiography;  Surgeon: Katha Cabal, MD;  Location: [facility];  Service: Cardiovascular;  Laterality: Right;  . LOWER EXTREMITY INTERVENTION  11/18/2016   Procedure: Lower Extremity Intervention;  Surgeon: Katha Cabal, MD;  Location: [place] [facility];  Service: Cardiovascular;;  . PERIPHERAL VASCULAR CATHETERIZATION Right 01/02/2015   Procedure: Lower Extremity Angiography;  Surgeon: Katha Cabal, MD;  Location: [place];  Service: Cardiovascular;  Laterality: Right;  . PERIPHERAL VASCULAR CATHETERIZATION Right 06/18/2015   Procedure: Lower Extremity Angiography;  Surgeon: Algernon Huxley, MD;  Location: [place] [facility];  Service: Cardiovascular;  Laterality: Right;  . PERIPHERAL VASCULAR CATHETERIZATION  06/18/2015   Procedure: Lower Extremity Intervention;  Surgeon: Algernon Huxley, MD;  Location: [place] [facility];  Service: Cardiovascular;;  . PERIPHERAL VASCULAR CATHETERIZATION N/A 10/09/2015   Procedure: Abdominal Aortogram w/Lower Extremity;  Surgeon: Katha Cabal, MD;  Location: [place] [facility];  Service: Cardiovascular;  Laterality: N/A;  . PERIPHERAL VASCULAR CATHETERIZATION  10/09/2015   Procedure: Lower Extremity Intervention;  Surgeon: Katha Cabal, MD;  Location: [place] [facility];  Service: Cardiovascular;;  . PERIPHERAL VASCULAR CATHETERIZATION Right 10/10/2015   Procedure: Lower Extremity Angiography;  Surgeon: Algernon Huxley, MD;  Location: [place] [facility];  Service: Cardiovascular;  Laterality: Right;  . PERIPHERAL VASCULAR CATHETERIZATION Right 10/10/2015   Procedure: Lower Extremity Intervention;  Surgeon: Corene Cornea  Bunnie Domino, MD;  Location: [place] [facility];  Service: Cardiovascular;  Laterality: Right;  . PERIPHERAL VASCULAR  CATHETERIZATION Right 12/03/2015   Procedure: Lower Extremity Angiography;  Surgeon: Algernon Huxley, MD;  Location: [place] [facility];  Service: Cardiovascular;  Laterality: Right;  . PERIPHERAL VASCULAR CATHETERIZATION Right 12/04/2015   Procedure: Lower Extremity Angiography;  Surgeon: Algernon Huxley, MD;  Location: [place] [facility];  Service: Cardiovascular;  Laterality: Right;  . PERIPHERAL VASCULAR CATHETERIZATION  12/04/2015   Procedure: Lower Extremity Intervention;  Surgeon: Algernon Huxley, MD;  Location: [place] [facility];  Service: Cardiovascular;;  . PERIPHERAL VASCULAR CATHETERIZATION Right 06/30/2016   Procedure: Lower Extremity Angiography;  Surgeon: Algernon Huxley, MD;  Location: [place] [facility];  Service: Cardiovascular;  Laterality: Right;  . PERIPHERAL VASCULAR CATHETERIZATION Right 06/25/2016   Procedure: Lower Extremity Angiography;  Surgeon: Katha Cabal, MD;  Location: [place] [facility];  Service: Cardiovascular;  Laterality: Right;  . PERIPHERAL VASCULAR CATHETERIZATION Right 07/01/2016   Procedure: Lower Extremity Angiography;  Surgeon: Katha Cabal, MD;  Location: [place] [facility];  Service: Cardiovascular;  Laterality: Right;  . PERIPHERAL VASCULAR CATHETERIZATION  07/01/2016   Procedure: Lower Extremity Intervention;  Surgeon: Katha Cabal, MD;  Location: [facility];  Service: Cardiovascular;;  . PERIPHERAL VASCULAR CATHETERIZATION Right 08/01/2016   Procedure: Lower Extremity Angiography;  Surgeon: Katha Cabal, MD;  Location: [place] [facility];  Service: Cardiovascular;  Laterality: Right;  . stent placement in right leg Right     Social History Social History  Substance Use Topics  . Smoking status: Former Smoker    Packs/day: 1.50    Types: Cigarettes    Quit date: 12/20/2014  . Smokeless tobacco: Former Systems developer  . Alcohol use No    Family History No family history on file.  No Known Allergies   REVIEW OF SYSTEMS  (Negative unless checked)  Constitutional: [] Weight loss  [] Fever  [] Chills Cardiac: [] Chest pain   [] Chest pressure   [] Palpitations   [] Shortness of breath when laying flat   [] Shortness of breath with exertion. Vascular:  [x] Pain in legs with walking   [] Pain in legs at rest  [] History of DVT   [] Phlebitis   [x] Swelling in legs   [] Varicose veins   [] Non-healing ulcers Pulmonary:   [] Uses home oxygen   [] Productive cough   [] Hemoptysis   [] Wheeze  [] COPD   [] Asthma Neurologic:  [] Dizziness   [] Seizures   [] History of stroke   [] History of TIA  [] Aphasia   [] Vissual changes   [] Weakness or numbness in arm   [] Weakness or numbness in leg Musculoskeletal:   [] Joint swelling   [x] Joint pain   [] Low back pain Hematologic:  [] Easy bruising  [] Easy bleeding   [] Hypercoagulable state   [] Anemic Gastrointestinal:  [] Diarrhea   [] Vomiting  [] Gastroesophageal reflux/heartburn   [] Difficulty swallowing. Genitourinary:  [] Chronic kidney disease   [] Difficult urination  [] Frequent urination   [] Blood in urine Skin:  [] Rashes   [] Ulcers  Psychological:  [] History of anxiety   []  History of major depression.  Physical Examination  There were no vitals filed for this visit. There is no height or weight on file to calculate BMI. Gen: WD/WN, NAD Head: [place]/AT, No temporalis wasting.  Ear/Nose/Throat: Hearing grossly intact, nares w/o erythema or drainage Eyes: PER, EOMI, sclera nonicteric.  Neck: Supple, no large masses.   Pulmonary:  Good air movement, no audible wheezing bilaterally, no use  of accessory muscles.  Cardiac: RRR, no JVD Vascular: right foot 2+ edema toes pink and warm with brisk cap refill no ulcers Vessel Right Left  Radial Palpable Palpable  Ulnar Palpable Palpable  Brachial Palpable Palpable  Carotid Palpable Palpable  Femoral Palpable Palpable  Popliteal Not Palpable Palpable  PT 2+ Palpable Palpable  DP Not Palpable Palpable  Gastrointestinal: Non-distended. No guarding/no  peritoneal signs.  Musculoskeletal: M/S 5/5 throughout.  No deformity or atrophy.  Neurologic: CN 2-12 intact. Symmetrical.  Speech is fluent. Motor exam as listed above. Psychiatric: Judgment intact, Mood & affect appropriate for pt's clinical situation. Dermatologic: No rashes or ulcers noted.  No changes consistent with cellulitis. Lymph : No lichenification or skin changes of chronic lymphedema.  CBC Lab Results  Component Value Date   WBC 5.1 11/19/2016   HGB 12.8 (L) 11/19/2016   HCT 39.2 (L) 11/19/2016   MCV 84.7 11/19/2016   PLT 154 11/19/2016    BMET    Component Value Date/Time   NA 142 11/19/2016 0513   K 4.2 11/19/2016 0513   CL 107 11/19/2016 0513   CO2 31 11/19/2016 0513   GLUCOSE 102 (H) 11/19/2016 0513   BUN 22 (H) 12/08/2016 1001   BUN 16 07/03/2014 1205   CREATININE 1.29 (H) 12/08/2016 1001   CREATININE 1.34 (H) 07/03/2014 1205   CALCIUM 9.0 11/19/2016 0513   GFRNONAA 60 (L) 12/08/2016 1001   GFRNONAA 59 (L) 07/03/2014 1205   GFRAA >60 12/08/2016 1001   GFRAA >60 07/03/2014 1205   CrCl cannot be calculated (Patient's most recent lab result is older than the maximum 21 days allowed.).  COAG Lab Results  Component Value Date   INR 1.11 06/25/2016   INR 1.90 05/12/2016   INR 1.20 12/05/2015    Radiology No results found.  Assessment/Plan 1. Atherosclerosis of artery of extremity with rest pain ([place])  Recommend:  The patient has evidence of atherosclerosis of the lower extremities with claudication.  The patient does not voice lifestyle limiting changes at this point in time.  Noninvasive studies do not suggest clinically significant change.  No invasive studies, angiography or surgery at this time The patient should continue walking and begin a more formal exercise program.  The patient should continue antiplatelet therapy and aggressive treatment of the lipid abnormalities  No changes in the patient's medications at this time  The patient  should continue wearing graduated compression socks 10-15 mmHg strength to control the mild edema.   2. Essential hypertension Continue antihypertensive medications as already ordered, these medications have been reviewed and there are no changes at this time.   3. Mixed hyperlipidemia Continue statin as ordered and reviewed, no changes at this time   4. Pain of right lower extremity See #1    [name], MD  01/07/2017 9:15 PM

## 2017-01-08 ENCOUNTER — Ambulatory Visit (INDEPENDENT_AMBULATORY_CARE_PROVIDER_SITE_OTHER): Payer: BLUE CROSS/BLUE SHIELD | Admitting: Vascular Surgery

## 2017-01-08 ENCOUNTER — Ambulatory Visit (INDEPENDENT_AMBULATORY_CARE_PROVIDER_SITE_OTHER): Payer: BLUE CROSS/BLUE SHIELD

## 2017-01-08 ENCOUNTER — Encounter (INDEPENDENT_AMBULATORY_CARE_PROVIDER_SITE_OTHER): Payer: Self-pay | Admitting: Vascular Surgery

## 2017-01-08 VITALS — BP 105/60 | HR 76 | Resp 14 | Ht 70.0 in | Wt 214.0 lb

## 2017-01-08 DIAGNOSIS — E782 Mixed hyperlipidemia: Secondary | ICD-10-CM | POA: Diagnosis not present

## 2017-01-08 DIAGNOSIS — I70311 Atherosclerosis of unspecified type of bypass graft(s) of the extremities with intermittent claudication, right leg: Secondary | ICD-10-CM

## 2017-01-08 DIAGNOSIS — M79604 Pain in right leg: Secondary | ICD-10-CM

## 2017-01-08 DIAGNOSIS — I10 Essential (primary) hypertension: Secondary | ICD-10-CM

## 2017-01-08 DIAGNOSIS — I70229 Atherosclerosis of native arteries of extremities with rest pain, unspecified extremity: Secondary | ICD-10-CM | POA: Diagnosis not present

## 2017-01-09 ENCOUNTER — Encounter: Payer: Self-pay | Admitting: Family Medicine

## 2017-01-27 ENCOUNTER — Other Ambulatory Visit (INDEPENDENT_AMBULATORY_CARE_PROVIDER_SITE_OTHER): Payer: Self-pay | Admitting: Vascular Surgery

## 2017-01-30 ENCOUNTER — Other Ambulatory Visit (INDEPENDENT_AMBULATORY_CARE_PROVIDER_SITE_OTHER): Payer: Self-pay | Admitting: Vascular Surgery

## 2017-02-02 ENCOUNTER — Other Ambulatory Visit (INDEPENDENT_AMBULATORY_CARE_PROVIDER_SITE_OTHER): Payer: Self-pay

## 2017-02-02 MED ORDER — CLOPIDOGREL BISULFATE 75 MG PO TABS: 75.0000 mg | ORAL_TABLET | Freq: Every day | ORAL | 6 refills | Status: DC

## 2017-03-17 ENCOUNTER — Encounter: Payer: Self-pay | Admitting: Vascular Surgery

## 2017-03-25 ENCOUNTER — Telehealth (INDEPENDENT_AMBULATORY_CARE_PROVIDER_SITE_OTHER): Payer: Self-pay | Admitting: Vascular Surgery

## 2017-03-25 NOTE — Telephone Encounter (Signed)
Patient states the state sent him to have MRI at [facility] as part of his disability approval process and he wants to know if Dr. Delana Meyer could look at that and get what he needs and he not come in for his ultrasound appt on 9/20. Please call back and advise. I informed patient that it make take a day or two for response as the doctor is in procedures today. He was fine with that.

## 2017-03-26 NOTE — Telephone Encounter (Signed)
Called the patient back to let him know that Dr. Delana Meyer said that he would review hi MRI done at [facility] and let him know if he still needs to keep hi 9/20 appointment.

## 2017-03-30 ENCOUNTER — Other Ambulatory Visit (INDEPENDENT_AMBULATORY_CARE_PROVIDER_SITE_OTHER): Payer: Self-pay | Admitting: Vascular Surgery

## 2017-04-09 ENCOUNTER — Encounter (INDEPENDENT_AMBULATORY_CARE_PROVIDER_SITE_OTHER): Payer: Self-pay | Admitting: Vascular Surgery

## 2017-04-09 ENCOUNTER — Ambulatory Visit (INDEPENDENT_AMBULATORY_CARE_PROVIDER_SITE_OTHER): Payer: Self-pay

## 2017-04-09 ENCOUNTER — Ambulatory Visit (INDEPENDENT_AMBULATORY_CARE_PROVIDER_SITE_OTHER): Payer: Self-pay | Admitting: Vascular Surgery

## 2017-04-09 VITALS — BP 120/67 | HR 78 | Resp 17 | Wt 217.0 lb

## 2017-04-09 DIAGNOSIS — E782 Mixed hyperlipidemia: Secondary | ICD-10-CM

## 2017-04-09 DIAGNOSIS — I70229 Atherosclerosis of native arteries of extremities with rest pain, unspecified extremity: Secondary | ICD-10-CM

## 2017-04-09 DIAGNOSIS — I10 Essential (primary) hypertension: Secondary | ICD-10-CM

## 2017-04-09 DIAGNOSIS — I70219 Atherosclerosis of native arteries of extremities with intermittent claudication, unspecified extremity: Secondary | ICD-10-CM

## 2017-04-11 NOTE — Progress Notes (Signed)
MRN : [id number]  Joshua [name] is a 61 y.o. (11/08/1955) male who presents with chief complaint of  Chief Complaint  Patient presents with  . Follow-up    60mo abi,rle art  .  History of Present Illness: The patient returns to the office for followup and review of the noninvasive studies. There have been no interval changes in lower extremity symptoms. No interval shortening of the patient's claudication distance or development of rest pain symptoms. No new ulcers or wounds have occurred since the last visit.  There have been no significant changes to the patient's overall health care.  The patient denies amaurosis fugax or recent TIA symptoms. There are no recent neurological changes noted. The patient denies history of DVT, PE or superficial thrombophlebitis. The patient denies recent episodes of angina or shortness of breath.   ABI Rt=1.15 and Lt=1.14   Duplex ultrasound of the right bypass graft shows a widely patent bypass  Current Meds  Medication Sig  . acetaminophen (TYLENOL) 650 MG CR tablet Take 650 mg by mouth daily as needed for pain.  Marland Kitchen aspirin EC 81 MG tablet Take 81 mg by mouth daily.  . clopidogrel (PLAVIX) 75 MG tablet Take 1 tablet (75 mg total) by mouth daily.  Marland Kitchen lisinopril (PRINIVIL,ZESTRIL) 20 MG tablet Take 20 mg by mouth daily.  . simvastatin (ZOCOR) 20 MG tablet TAKE 1 TABLET BY MOUTH DAILY    Past Medical History:  Diagnosis Date  . Dyspnea    former smoker. only doe  . Hyperlipidemia   . Hypertension   . Peripheral vascular disease ([place])   . Pre-diabetes     Past Surgical History:  Procedure Laterality Date  . BACK SURGERY  2011  . CENTRAL LINE INSERTION Right 09/10/2016   Procedure: CENTRAL LINE INSERTION;  Surgeon: Katha Cabal, MD;  Location: [facility];  Service: Vascular;  Laterality: Right;  . COLONOSCOPY  2013 ?  . FEMORAL-POPLITEAL BYPASS GRAFT Right 09/10/2016   Procedure: BYPASS GRAFT FEMORAL-POPLITEAL ARTERY ( BELOW KNEE  );  Surgeon: Katha Cabal, MD;  Location: [facility];  Service: Vascular;  Laterality: Right;  . LOWER EXTREMITY ANGIOGRAPHY Right 11/18/2016   Procedure: Lower Extremity Angiography;  Surgeon: Katha Cabal, MD;  Location: [place] [facility];  Service: Cardiovascular;  Laterality: Right;  . LOWER EXTREMITY ANGIOGRAPHY Right 12/09/2016   Procedure: Lower Extremity Angiography;  Surgeon: Katha Cabal, MD;  Location: [place] [facility];  Service: Cardiovascular;  Laterality: Right;  . LOWER EXTREMITY INTERVENTION  11/18/2016   Procedure: Lower Extremity Intervention;  Surgeon: Katha Cabal, MD;  Location: [place] [facility];  Service: Cardiovascular;;  . PERIPHERAL VASCULAR CATHETERIZATION Right 01/02/2015   Procedure: Lower Extremity Angiography;  Surgeon: Katha Cabal, MD;  Location: [place] [facility];  Service: Cardiovascular;  Laterality: Right;  . PERIPHERAL VASCULAR CATHETERIZATION Right 06/18/2015   Procedure: Lower Extremity Angiography;  Surgeon: Algernon Huxley, MD;  Location: [place] [facility];  Service: Cardiovascular;  Laterality: Right;  . PERIPHERAL VASCULAR CATHETERIZATION  06/18/2015   Procedure: Lower Extremity Intervention;  Surgeon: Algernon Huxley, MD;  Location: [place] [facility];  Service: Cardiovascular;;  . PERIPHERAL VASCULAR CATHETERIZATION N/A 10/09/2015   Procedure: Abdominal Aortogram w/Lower Extremity;  Surgeon: Katha Cabal, MD;  Location: [place] [facility];  Service: Cardiovascular;  Laterality: N/A;  . PERIPHERAL VASCULAR CATHETERIZATION  10/09/2015   Procedure: Lower Extremity Intervention;  Surgeon: Katha Cabal, MD;  Location:  [place] [facility];  Service: Cardiovascular;;  . PERIPHERAL VASCULAR CATHETERIZATION Right 10/10/2015   Procedure: Lower Extremity Angiography;  Surgeon: Algernon Huxley, MD;  Location: [place] [facility];  Service: Cardiovascular;  Laterality: Right;  . PERIPHERAL VASCULAR CATHETERIZATION Right 10/10/2015    Procedure: Lower Extremity Intervention;  Surgeon: Algernon Huxley, MD;  Location: [place] [facility];  Service: Cardiovascular;  Laterality: Right;  . PERIPHERAL VASCULAR CATHETERIZATION Right 12/03/2015   Procedure: Lower Extremity Angiography;  Surgeon: Algernon Huxley, MD;  Location: [place] [facility];  Service: Cardiovascular;  Laterality: Right;  . PERIPHERAL VASCULAR CATHETERIZATION Right 12/04/2015   Procedure: Lower Extremity Angiography;  Surgeon: Algernon Huxley, MD;  Location: [place] [facility];  Service: Cardiovascular;  Laterality: Right;  . PERIPHERAL VASCULAR CATHETERIZATION  12/04/2015   Procedure: Lower Extremity Intervention;  Surgeon: Algernon Huxley, MD;  Location: [place] [facility];  Service: Cardiovascular;;  . PERIPHERAL VASCULAR CATHETERIZATION Right 06/30/2016   Procedure: Lower Extremity Angiography;  Surgeon: Algernon Huxley, MD;  Location: [place] [facility];  Service: Cardiovascular;  Laterality: Right;  . PERIPHERAL VASCULAR CATHETERIZATION Right 06/25/2016   Procedure: Lower Extremity Angiography;  Surgeon: Katha Cabal, MD;  Location: [place] [facility];  Service: Cardiovascular;  Laterality: Right;  . PERIPHERAL VASCULAR CATHETERIZATION Right 07/01/2016   Procedure: Lower Extremity Angiography;  Surgeon: Katha Cabal, MD;  Location: [place] [facility];  Service: Cardiovascular;  Laterality: Right;  . PERIPHERAL VASCULAR CATHETERIZATION  07/01/2016   Procedure: Lower Extremity Intervention;  Surgeon: Katha Cabal, MD;  Location: [place] [facility];  Service: Cardiovascular;;  . PERIPHERAL VASCULAR CATHETERIZATION Right 08/01/2016   Procedure: Lower Extremity Angiography;  Surgeon: Katha Cabal, MD;  Location: [place] [facility];  Service: Cardiovascular;  Laterality: Right;  . stent placement in right leg Right     Social History Social History  Substance Use Topics  . Smoking status: Former Smoker    Packs/day: 1.50    Types: Cigarettes     Quit date: 12/20/2014  . Smokeless tobacco: Former Systems developer  . Alcohol use No    Family History No family history on file.  No Known Allergies   REVIEW OF SYSTEMS (Negative unless checked)  Constitutional: [] Weight loss  [] Fever  [] Chills Cardiac: [] Chest pain   [] Chest pressure   [] Palpitations   [] Shortness of breath when laying flat   [] Shortness of breath with exertion. Vascular:  [x] Pain in legs with walking   [x] Pain in legs with standing  [] History of DVT   [] Phlebitis   [] Swelling in legs   [] Varicose veins   [] Non-healing ulcers Pulmonary:   [] Uses home oxygen   [] Productive cough   [] Hemoptysis   [] Wheeze  [] COPD   [] Asthma Neurologic:  [] Dizziness   [] Seizures   [] History of stroke   [] History of TIA  [] Aphasia   [] Vissual changes   [] Weakness or numbness in arm   [] Weakness or numbness in leg Musculoskeletal:   [] Joint swelling   [] Joint pain   [] Low back pain Hematologic:  [] Easy bruising  [] Easy bleeding   [] Hypercoagulable state   [] Anemic Gastrointestinal:  [] Diarrhea   [] Vomiting  [] Gastroesophageal reflux/heartburn   [] Difficulty swallowing. Genitourinary:  [] Chronic kidney disease   [] Difficult urination  [] Frequent urination   [] Blood in urine Skin:  [] Rashes   [] Ulcers  Psychological:  [] History of anxiety   []  History of major depression.  Physical Examination  Vitals:   04/09/17 1448  BP:  120/67  Pulse: 78  Resp: 17  Weight: 98.4 kg (217 lb)   Body mass index is 31.14 kg/m. Gen: WD/WN, NAD Head: [place]/AT, No temporalis wasting.  Ear/Nose/Throat: Hearing grossly intact, nares w/o erythema or drainage Eyes: PER, EOMI, sclera nonicteric.  Neck: Supple, no large masses.   Pulmonary:  Good air movement, no audible wheezing bilaterally, no use of accessory muscles.  Cardiac: RRR, no JVD Vascular:  Vessel Right Left  Radial Palpable Palpable  Ulnar Palpable Palpable  Brachial Palpable Palpable  Carotid Palpable Palpable  Femoral Palpable Palpable  Popliteal  Not Palpable Palpable  PT 1+ Palpable Palpable  DP Not Palpable Palpable  Gastrointestinal: Non-distended. No guarding/no peritoneal signs.  Musculoskeletal: M/S 5/5 throughout.  No deformity or atrophy.  Neurologic: CN 2-12 intact. Symmetrical.  Speech is fluent. Motor exam as listed above. Psychiatric: Judgment intact, Mood & affect appropriate for pt's clinical situation. Dermatologic: No rashes or ulcers noted.  No changes consistent with cellulitis. Lymph : No lichenification or skin changes of chronic lymphedema.  CBC Lab Results  Component Value Date   WBC 5.1 11/19/2016   HGB 12.8 (L) 11/19/2016   HCT 39.2 (L) 11/19/2016   MCV 84.7 11/19/2016   PLT 154 11/19/2016    BMET    Component Value Date/Time   NA 142 11/19/2016 0513   K 4.2 11/19/2016 0513   CL 107 11/19/2016 0513   CO2 31 11/19/2016 0513   GLUCOSE 102 (H) 11/19/2016 0513   BUN 22 (H) 12/08/2016 1001   BUN 16 07/03/2014 1205   CREATININE 1.29 (H) 12/08/2016 1001   CREATININE 1.34 (H) 07/03/2014 1205   CALCIUM 9.0 11/19/2016 0513   GFRNONAA 60 (L) 12/08/2016 1001   GFRNONAA 59 (L) 07/03/2014 1205   GFRAA >60 12/08/2016 1001   GFRAA >60 07/03/2014 1205   CrCl cannot be calculated (Patient's most recent lab result is older than the maximum 21 days allowed.).  COAG Lab Results  Component Value Date   INR 1.11 06/25/2016   INR 1.90 05/12/2016   INR 1.20 12/05/2015    Radiology No results found.  Assessment/Plan 1. Atherosclerosis of lower extremity with claudication ([place]) Recommend:  The patient is status post successful angiogram with intervention.  The patient reports that the claudication symptoms and leg pain is essentially gone.   The patient denies lifestyle limiting changes at this point in time.  No further invasive studies, angiography or surgery at this time The patient should continue walking and begin a more formal exercise program.  The patient should continue antiplatelet therapy and  aggressive treatment of the lipid abnormalities  Smoking cessation was again discussed  The patient should continue wearing graduated compression socks 10-15 mmHg strength to control the mild edema.  Patient should undergo noninvasive studies as ordered. The patient will follow up with me after the studies.   - VAS [place] ABI WITH/WO TBI; Future - VAS [place] LOWER EXTREMITY ARTERIAL DUPLEX; Future  2. Essential hypertension Continue antihypertensive medications as already ordered, these medications have been reviewed and there are no changes at this time.   3. Mixed hyperlipidemia Continue statin as ordered and reviewed, no changes at this time     [name], MD  04/11/2017 4:04 PM

## 2017-06-30 ENCOUNTER — Other Ambulatory Visit (INDEPENDENT_AMBULATORY_CARE_PROVIDER_SITE_OTHER): Payer: Self-pay

## 2017-06-30 MED ORDER — SIMVASTATIN 20 MG PO TABS: 20.0000 mg | ORAL_TABLET | Freq: Every day | ORAL | 0 refills | Status: DC

## 2017-08-27 ENCOUNTER — Other Ambulatory Visit (INDEPENDENT_AMBULATORY_CARE_PROVIDER_SITE_OTHER): Payer: Self-pay | Admitting: Vascular Surgery

## 2017-09-27 ENCOUNTER — Other Ambulatory Visit (INDEPENDENT_AMBULATORY_CARE_PROVIDER_SITE_OTHER): Payer: Self-pay | Admitting: Vascular Surgery

## 2017-10-08 ENCOUNTER — Ambulatory Visit (INDEPENDENT_AMBULATORY_CARE_PROVIDER_SITE_OTHER): Payer: Self-pay

## 2017-10-08 ENCOUNTER — Ambulatory Visit (INDEPENDENT_AMBULATORY_CARE_PROVIDER_SITE_OTHER): Payer: Self-pay | Admitting: Vascular Surgery

## 2017-10-08 ENCOUNTER — Encounter (INDEPENDENT_AMBULATORY_CARE_PROVIDER_SITE_OTHER): Payer: Self-pay | Admitting: Vascular Surgery

## 2017-10-08 VITALS — BP 121/71 | HR 67 | Resp 16 | Wt 217.0 lb

## 2017-10-08 DIAGNOSIS — I70219 Atherosclerosis of native arteries of extremities with intermittent claudication, unspecified extremity: Secondary | ICD-10-CM

## 2017-10-08 DIAGNOSIS — E782 Mixed hyperlipidemia: Secondary | ICD-10-CM

## 2017-10-08 DIAGNOSIS — I10 Essential (primary) hypertension: Secondary | ICD-10-CM

## 2017-10-11 ENCOUNTER — Encounter (INDEPENDENT_AMBULATORY_CARE_PROVIDER_SITE_OTHER): Payer: Self-pay | Admitting: Vascular Surgery

## 2017-10-11 NOTE — Progress Notes (Signed)
MRN : [id number]  Joshua [name] is a 62 y.o. (1956-02-22) male who presents with chief complaint of  Chief Complaint  Patient presents with  . Follow-up    82mo abi,lle arterial  .  History of Present Illness: The patient returns to the office for followup and review of the noninvasive studies. There have been no interval changes in lower extremity symptoms. No interval shortening of the patient's claudication distance or development of rest pain symptoms. No new ulcers or wounds have occurred since the last visit.  There have been no significant changes to the patient's overall health care.  The patient denies amaurosis fugax or recent TIA symptoms. There are no recent neurological changes noted. The patient denies history of DVT, PE or superficial thrombophlebitis. The patient denies recent episodes of angina or shortness of breath.   ABI Rt=0.84 and Lt=1.12   Duplex ultrasound of the bypass remains patent with a stable mid graft stenosis Current Meds  Medication Sig  . acetaminophen (TYLENOL) 650 MG CR tablet Take 650 mg by mouth daily as needed for pain.  Marland Kitchen aspirin EC 81 MG tablet Take 81 mg by mouth daily.  . clopidogrel (PLAVIX) 75 MG tablet TAKE 1 TABLET BY MOUTH DAILY  . hydrochlorothiazide (HYDRODIURIL) 12.5 MG tablet TAKE 1 TABLET(12.5 MG) BY MOUTH EVERY DAY  . lisinopril (PRINIVIL,ZESTRIL) 20 MG tablet Take 20 mg by mouth daily.  . simvastatin (ZOCOR) 20 MG tablet TAKE 1 TABLET BY MOUTH DAILY    Past Medical History:  Diagnosis Date  . Dyspnea    former smoker. only doe  . Hyperlipidemia   . Hypertension   . Peripheral vascular disease ([place])   . Pre-diabetes     Past Surgical History:  Procedure Laterality Date  . BACK SURGERY  2011  . CENTRAL LINE INSERTION Right 09/10/2016   Procedure: CENTRAL LINE INSERTION;  Surgeon: Katha Cabal, MD;  Location: [facility];  Service: Vascular;  Laterality: Right;  . COLONOSCOPY  2013 ?  . FEMORAL-POPLITEAL  BYPASS GRAFT Right 09/10/2016   Procedure: BYPASS GRAFT FEMORAL-POPLITEAL ARTERY ( BELOW KNEE );  Surgeon: Katha Cabal, MD;  Location: [facility];  Service: Vascular;  Laterality: Right;  . LOWER EXTREMITY ANGIOGRAPHY Right 11/18/2016   Procedure: Lower Extremity Angiography;  Surgeon: Katha Cabal, MD;  Location: [place] [facility];  Service: Cardiovascular;  Laterality: Right;  . LOWER EXTREMITY ANGIOGRAPHY Right 12/09/2016   Procedure: Lower Extremity Angiography;  Surgeon: Katha Cabal, MD;  Location: [place] [facility];  Service: Cardiovascular;  Laterality: Right;  . LOWER EXTREMITY INTERVENTION  11/18/2016   Procedure: Lower Extremity Intervention;  Surgeon: Katha Cabal, MD;  Location: [place] [facility];  Service: Cardiovascular;;  . PERIPHERAL VASCULAR CATHETERIZATION Right 01/02/2015   Procedure: Lower Extremity Angiography;  Surgeon: Katha Cabal, MD;  Location: [place] [facility];  Service: Cardiovascular;  Laterality: Right;  . PERIPHERAL VASCULAR CATHETERIZATION Right 06/18/2015   Procedure: Lower Extremity Angiography;  Surgeon: Algernon Huxley, MD;  Location: [place] [facility];  Service: Cardiovascular;  Laterality: Right;  . PERIPHERAL VASCULAR CATHETERIZATION  06/18/2015   Procedure: Lower Extremity Intervention;  Surgeon: Algernon Huxley, MD;  Location: [place] [facility];  Service: Cardiovascular;;  . PERIPHERAL VASCULAR CATHETERIZATION N/A 10/09/2015   Procedure: Abdominal Aortogram w/Lower Extremity;  Surgeon: Katha Cabal, MD;  Location: [place] [facility];  Service: Cardiovascular;  Laterality: N/A;  . PERIPHERAL VASCULAR CATHETERIZATION  10/09/2015  Procedure: Lower Extremity Intervention;  Surgeon: Katha Cabal, MD;  Location: [place] [facility];  Service: Cardiovascular;;  . PERIPHERAL VASCULAR CATHETERIZATION Right 10/10/2015   Procedure: Lower Extremity Angiography;  Surgeon: Algernon Huxley, MD;  Location: [place] [facility];   Service: Cardiovascular;  Laterality: Right;  . PERIPHERAL VASCULAR CATHETERIZATION Right 10/10/2015   Procedure: Lower Extremity Intervention;  Surgeon: Algernon Huxley, MD;  Location: [place] [facility];  Service: Cardiovascular;  Laterality: Right;  . PERIPHERAL VASCULAR CATHETERIZATION Right 12/03/2015   Procedure: Lower Extremity Angiography;  Surgeon: Algernon Huxley, MD;  Location: [place] [facility];  Service: Cardiovascular;  Laterality: Right;  . PERIPHERAL VASCULAR CATHETERIZATION Right 12/04/2015   Procedure: Lower Extremity Angiography;  Surgeon: Algernon Huxley, MD;  Location: [place] [facility];  Service: Cardiovascular;  Laterality: Right;  . PERIPHERAL VASCULAR CATHETERIZATION  12/04/2015   Procedure: Lower Extremity Intervention;  Surgeon: Algernon Huxley, MD;  Location: [place] [facility];  Service: Cardiovascular;;  . PERIPHERAL VASCULAR CATHETERIZATION Right 06/30/2016   Procedure: Lower Extremity Angiography;  Surgeon: Algernon Huxley, MD;  Location: [place] [facility];  Service: Cardiovascular;  Laterality: Right;  . PERIPHERAL VASCULAR CATHETERIZATION Right 06/25/2016   Procedure: Lower Extremity Angiography;  Surgeon: Katha Cabal, MD;  Location: [place] [facility];  Service: Cardiovascular;  Laterality: Right;  . PERIPHERAL VASCULAR CATHETERIZATION Right 07/01/2016   Procedure: Lower Extremity Angiography;  Surgeon: Katha Cabal, MD;  Location: [place] [facility];  Service: Cardiovascular;  Laterality: Right;  . PERIPHERAL VASCULAR CATHETERIZATION  07/01/2016   Procedure: Lower Extremity Intervention;  Surgeon: Katha Cabal, MD;  Location: [place] [facility];  Service: Cardiovascular;;  . PERIPHERAL VASCULAR CATHETERIZATION Right 08/01/2016   Procedure: Lower Extremity Angiography;  Surgeon: Katha Cabal, MD;  Location: [place] [facility];  Service: Cardiovascular;  Laterality: Right;  . stent placement in right leg Right     Social History Social History     Tobacco Use  . Smoking status: Former Smoker    Packs/day: 1.50    Types: Cigarettes    Last attempt to quit: 12/20/2014    Years since quitting: 2.8  . Smokeless tobacco: Former Network engineer Use Topics  . Alcohol use: No  . Drug use: No    Family History History reviewed. No pertinent family history.  No Known Allergies   REVIEW OF SYSTEMS (Negative unless checked)  Constitutional: [] Weight loss  [] Fever  [] Chills Cardiac: [] Chest pain   [] Chest pressure   [] Palpitations   [] Shortness of breath when laying flat   [] Shortness of breath with exertion. Vascular:  [x] Pain in legs with walking   [] Pain in legs at rest  [] History of DVT   [] Phlebitis   [] Swelling in legs   [] Varicose veins   [] Non-healing ulcers Pulmonary:   [] Uses home oxygen   [] Productive cough   [] Hemoptysis   [] Wheeze  [] COPD   [] Asthma Neurologic:  [] Dizziness   [] Seizures   [] History of stroke   [] History of TIA  [] Aphasia   [] Vissual changes   [] Weakness or numbness in arm   [] Weakness or numbness in leg Musculoskeletal:   [] Joint swelling   [] Joint pain   [] Low back pain Hematologic:  [] Easy bruising  [] Easy bleeding   [] Hypercoagulable state   [] Anemic Gastrointestinal:  [] Diarrhea   [] Vomiting  [] Gastroesophageal reflux/heartburn   [] Difficulty swallowing. Genitourinary:  [] Chronic kidney disease   [] Difficult urination  [] Frequent urination   [] Blood  in urine Skin:  [] Rashes   [] Ulcers  Psychological:  [] History of anxiety   []  History of major depression.  Physical Examination  Vitals:   10/08/17 1528  BP: 121/71  Pulse: 67  Resp: 16  Weight: 217 lb (98.4 kg)   Body mass index is 31.14 kg/m. Gen: WD/WN, NAD Head: [place]/AT, No temporalis wasting.  Ear/Nose/Throat: Hearing grossly intact, nares w/o erythema or drainage Eyes: PER, EOMI, sclera nonicteric.  Neck: Supple, no large masses.   Pulmonary:  Good air movement, no audible wheezing bilaterally, no use of accessory muscles.  Cardiac: RRR,  no JVD Vascular:  Vessel Right Left  Radial Palpable Palpable  Ulnar Palpable Palpable  Brachial Palpable Palpable  Carotid Palpable Palpable  Femoral Palpable Palpable  Popliteal Palpable Palpable  PT 1+ Palpable Palpable  DP Not Palpable Palpable  Gastrointestinal: Non-distended. No guarding/no peritoneal signs.  Musculoskeletal: M/S 5/5 throughout.  No deformity or atrophy.  Neurologic: CN 2-12 intact. Symmetrical.  Speech is fluent. Motor exam as listed above. Psychiatric: Judgment intact, Mood & affect appropriate for pt's clinical situation. Dermatologic: No rashes or ulcers noted.  No changes consistent with cellulitis. Lymph : No lichenification or skin changes of chronic lymphedema.  CBC Lab Results  Component Value Date   WBC 5.1 11/19/2016   HGB 12.8 (L) 11/19/2016   HCT 39.2 (L) 11/19/2016   MCV 84.7 11/19/2016   PLT 154 11/19/2016    BMET    Component Value Date/Time   NA 142 11/19/2016 0513   K 4.2 11/19/2016 0513   CL 107 11/19/2016 0513   CO2 31 11/19/2016 0513   GLUCOSE 102 (H) 11/19/2016 0513   BUN 22 (H) 12/08/2016 1001   BUN 16 07/03/2014 1205   CREATININE 1.29 (H) 12/08/2016 1001   CREATININE 1.34 (H) 07/03/2014 1205   CALCIUM 9.0 11/19/2016 0513   GFRNONAA 60 (L) 12/08/2016 1001   GFRNONAA 59 (L) 07/03/2014 1205   GFRAA >60 12/08/2016 1001   GFRAA >60 07/03/2014 1205   CrCl cannot be calculated (Patient's most recent lab result is older than the maximum 21 days allowed.).  COAG Lab Results  Component Value Date   INR 1.11 06/25/2016   INR 1.90 05/12/2016   INR 1.20 12/05/2015    Radiology No results found.    Assessment/Plan 1. Atherosclerosis of lower extremity with claudication ([place])  Recommend:  The patient has evidence of atherosclerosis of the lower extremities with claudication.  The patient does not voice lifestyle limiting changes at this point in time.  Noninvasive studies do not suggest clinically significant  change.  No invasive studies, angiography or surgery at this time The patient should continue walking and begin a more formal exercise program.  The patient should continue antiplatelet therapy and aggressive treatment of the lipid abnormalities  No changes in the patient's medications at this time  The patient should continue wearing graduated compression socks 10-15 mmHg strength to control the mild edema.   - VAS [place] ABI WITH/WO TBI; Future - VAS [place] LOWER EXTREMITY ARTERIAL DUPLEX; Future  2. Essential hypertension Continue antihypertensive medications as already ordered, these medications have been reviewed and there are no changes at this time.   3. Mixed hyperlipidemia Continue statin as ordered and reviewed, no changes at this time     [name], MD  10/11/2017 1:57 PM

## 2018-03-16 ENCOUNTER — Other Ambulatory Visit (INDEPENDENT_AMBULATORY_CARE_PROVIDER_SITE_OTHER): Payer: Self-pay | Admitting: Vascular Surgery

## 2018-04-15 ENCOUNTER — Ambulatory Visit (INDEPENDENT_AMBULATORY_CARE_PROVIDER_SITE_OTHER): Payer: Self-pay

## 2018-04-15 ENCOUNTER — Ambulatory Visit (INDEPENDENT_AMBULATORY_CARE_PROVIDER_SITE_OTHER): Payer: Self-pay | Admitting: Vascular Surgery

## 2018-04-15 ENCOUNTER — Encounter (INDEPENDENT_AMBULATORY_CARE_PROVIDER_SITE_OTHER): Payer: Self-pay | Admitting: Vascular Surgery

## 2018-04-15 VITALS — BP 141/77 | HR 71 | Resp 16 | Ht 72.0 in | Wt 221.0 lb

## 2018-04-15 DIAGNOSIS — I70219 Atherosclerosis of native arteries of extremities with intermittent claudication, unspecified extremity: Secondary | ICD-10-CM

## 2018-04-15 DIAGNOSIS — I10 Essential (primary) hypertension: Secondary | ICD-10-CM

## 2018-04-15 DIAGNOSIS — E782 Mixed hyperlipidemia: Secondary | ICD-10-CM

## 2018-04-15 DIAGNOSIS — Z87891 Personal history of nicotine dependence: Secondary | ICD-10-CM

## 2018-04-15 NOTE — Progress Notes (Signed)
MRN : [id number]  Joshua [name] is a 62 y.o. (1955-11-24) male who presents with chief complaint of  Chief Complaint  Patient presents with  . Follow-up    6 month abi and arterial follow up  .  History of Present Illness:   The patient returns to the office for followup and review of the noninvasive studies. There have been no interval changes in lower extremity symptoms. No interval shortening of the patient's claudication distance or development of rest pain symptoms. No new ulcers or wounds have occurred since the last visit.  There have been no significant changes to the patient's overall health care.  The patient denies amaurosis fugax or recent TIA symptoms. There are no recent neurological changes noted. The patient denies history of DVT, PE or superficial thrombophlebitis. The patient denies recent episodes of angina or shortness of breath.   ABI Rt=0.89 and Lt=1.09  (previous ABI Rt=0.84 and Lt=1.12  ) Duplex ultrasound of the bypass remains patent with a stable mild mid graft stenosis  Current Meds  Medication Sig  . acetaminophen (TYLENOL) 650 MG CR tablet Take 650 mg by mouth daily as needed for pain.  Marland Kitchen aspirin EC 81 MG tablet Take 81 mg by mouth daily.  . clopidogrel (PLAVIX) 75 MG tablet TAKE 1 TABLET BY MOUTH DAILY  . hydrochlorothiazide (HYDRODIURIL) 12.5 MG tablet TAKE 1 TABLET(12.5 MG) BY MOUTH EVERY DAY  . lisinopril (PRINIVIL,ZESTRIL) 20 MG tablet Take 20 mg by mouth daily.  . simvastatin (ZOCOR) 20 MG tablet TAKE 1 TABLET BY MOUTH DAILY    Past Medical History:  Diagnosis Date  . Dyspnea    former smoker. only doe  . Hyperlipidemia   . Hypertension   . Peripheral vascular disease ([place])   . Pre-diabetes     Past Surgical History:  Procedure Laterality Date  . BACK SURGERY  2011  . CENTRAL LINE INSERTION Right 09/10/2016   Procedure: CENTRAL LINE INSERTION;  Surgeon: Katha Cabal, MD;  Location: [facility];  Service: Vascular;   Laterality: Right;  . COLONOSCOPY  2013 ?  . FEMORAL-POPLITEAL BYPASS GRAFT Right 09/10/2016   Procedure: BYPASS GRAFT FEMORAL-POPLITEAL ARTERY ( BELOW KNEE );  Surgeon: Katha Cabal, MD;  Location: [facility];  Service: Vascular;  Laterality: Right;  . LOWER EXTREMITY ANGIOGRAPHY Right 11/18/2016   Procedure: Lower Extremity Angiography;  Surgeon: Katha Cabal, MD;  Location: [place] [facility];  Service: Cardiovascular;  Laterality: Right;  . LOWER EXTREMITY ANGIOGRAPHY Right 12/09/2016   Procedure: Lower Extremity Angiography;  Surgeon: Katha Cabal, MD;  Location: [place] [facility];  Service: Cardiovascular;  Laterality: Right;  . LOWER EXTREMITY INTERVENTION  11/18/2016   Procedure: Lower Extremity Intervention;  Surgeon: Katha Cabal, MD;  Location: [place] [facility];  Service: Cardiovascular;;  . PERIPHERAL VASCULAR CATHETERIZATION Right 01/02/2015   Procedure: Lower Extremity Angiography;  Surgeon: Katha Cabal, MD;  Location: [place] [facility];  Service: Cardiovascular;  Laterality: Right;  . PERIPHERAL VASCULAR CATHETERIZATION Right 06/18/2015   Procedure: Lower Extremity Angiography;  Surgeon: Algernon Huxley, MD;  Location: [place] [facility];  Service: Cardiovascular;  Laterality: Right;  . PERIPHERAL VASCULAR CATHETERIZATION  06/18/2015   Procedure: Lower Extremity Intervention;  Surgeon: Algernon Huxley, MD;  Location: [place] [facility];  Service: Cardiovascular;;  . PERIPHERAL VASCULAR CATHETERIZATION N/A 10/09/2015   Procedure: Abdominal Aortogram w/Lower Extremity;  Surgeon: Katha Cabal, MD;  Location: [place] [facility];  Service: Cardiovascular;  Laterality: N/A;  . PERIPHERAL VASCULAR CATHETERIZATION  10/09/2015   Procedure: Lower Extremity Intervention;  Surgeon: Katha Cabal, MD;  Location: [place] [facility];  Service: Cardiovascular;;  . PERIPHERAL VASCULAR CATHETERIZATION Right 10/10/2015   Procedure: Lower Extremity Angiography;   Surgeon: Algernon Huxley, MD;  Location: [place] [facility];  Service: Cardiovascular;  Laterality: Right;  . PERIPHERAL VASCULAR CATHETERIZATION Right 10/10/2015   Procedure: Lower Extremity Intervention;  Surgeon: Algernon Huxley, MD;  Location: [place] [facility];  Service: Cardiovascular;  Laterality: Right;  . PERIPHERAL VASCULAR CATHETERIZATION Right 12/03/2015   Procedure: Lower Extremity Angiography;  Surgeon: Algernon Huxley, MD;  Location: [place] [facility];  Service: Cardiovascular;  Laterality: Right;  . PERIPHERAL VASCULAR CATHETERIZATION Right 12/04/2015   Procedure: Lower Extremity Angiography;  Surgeon: Algernon Huxley, MD;  Location: [place] [facility];  Service: Cardiovascular;  Laterality: Right;  . PERIPHERAL VASCULAR CATHETERIZATION  12/04/2015   Procedure: Lower Extremity Intervention;  Surgeon: Algernon Huxley, MD;  Location: [place] [facility];  Service: Cardiovascular;;  . PERIPHERAL VASCULAR CATHETERIZATION Right 06/30/2016   Procedure: Lower Extremity Angiography;  Surgeon: Algernon Huxley, MD;  Location: [place] [facility];  Service: Cardiovascular;  Laterality: Right;  . PERIPHERAL VASCULAR CATHETERIZATION Right 06/25/2016   Procedure: Lower Extremity Angiography;  Surgeon: Katha Cabal, MD;  Location: [place] [facility];  Service: Cardiovascular;  Laterality: Right;  . PERIPHERAL VASCULAR CATHETERIZATION Right 07/01/2016   Procedure: Lower Extremity Angiography;  Surgeon: Katha Cabal, MD;  Location: [place] [facility];  Service: Cardiovascular;  Laterality: Right;  . PERIPHERAL VASCULAR CATHETERIZATION  07/01/2016   Procedure: Lower Extremity Intervention;  Surgeon: Katha Cabal, MD;  Location: [place] [facility];  Service: Cardiovascular;;  . PERIPHERAL VASCULAR CATHETERIZATION Right 08/01/2016   Procedure: Lower Extremity Angiography;  Surgeon: Katha Cabal, MD;  Location: [place] [facility];  Service: Cardiovascular;  Laterality: Right;  . stent  placement in right leg Right     Social History Social History   Tobacco Use  . Smoking status: Former Smoker    Packs/day: 1.50    Types: Cigarettes    Last attempt to quit: 12/20/2014    Years since quitting: 3.3  . Smokeless tobacco: Former Network engineer Use Topics  . Alcohol use: No  . Drug use: No    Family History History reviewed. No pertinent family history.  No Known Allergies   REVIEW OF SYSTEMS (Negative unless checked)  Constitutional: [] Weight loss  [] Fever  [] Chills Cardiac: [] Chest pain   [] Chest pressure   [] Palpitations   [] Shortness of breath when laying flat   [] Shortness of breath with exertion. Vascular:  [x] Pain in legs with walking   [] Pain in legs at rest  [] History of DVT   [] Phlebitis   [] Swelling in legs   [] Varicose veins   [] Non-healing ulcers Pulmonary:   [] Uses home oxygen   [] Productive cough   [] Hemoptysis   [] Wheeze  [] COPD   [] Asthma Neurologic:  [] Dizziness   [] Seizures   [] History of stroke   [] History of TIA  [] Aphasia   [] Vissual changes   [] Weakness or numbness in arm   [x] Numbness in leg Musculoskeletal:   [] Joint swelling   [x] Joint pain   [x] Low back pain Hematologic:  [] Easy bruising  [] Easy bleeding   [] Hypercoagulable state   [] Anemic Gastrointestinal:  [] Diarrhea   [] Vomiting  [] Gastroesophageal reflux/heartburn   [] Difficulty swallowing. Genitourinary:  [] Chronic kidney  disease   [] Difficult urination  [] Frequent urination   [] Blood in urine Skin:  [] Rashes   [] Ulcers  Psychological:  [] History of anxiety   []  History of major depression.  Physical Examination  Vitals:   04/15/18 1425  BP: (!) 141/77  Pulse: 71  Resp: 16  Weight: 221 lb (100.2 kg)  Height: 6' (1.829 m)   Body mass index is 29.97 kg/m. Gen: WD/WN, NAD Head: [place]/AT, No temporalis wasting.  Ear/Nose/Throat: Hearing grossly intact, nares w/o erythema or drainage Eyes: PER, EOMI, sclera nonicteric.  Neck: Supple, no large masses.   Pulmonary:  Good air  movement, no audible wheezing bilaterally, no use of accessory muscles.  Cardiac: RRR, no JVD Vascular: 1+ edema right ankle Vessel Right Left  Radial Palpable Palpable  PT Palpable Palpable  DP Not Palpable Palpable  Gastrointestinal: Non-distended. No guarding/no peritoneal signs.  Musculoskeletal: M/S 5/5 throughout.  No deformity or atrophy.  Neurologic: CN 2-12 intact. Symmetrical.  Speech is fluent. Motor exam as listed above. Psychiatric: Judgment intact, Mood & affect appropriate for pt's clinical situation. Dermatologic: No rashes or ulcers noted.  No changes consistent with cellulitis. Lymph : No lichenification or skin changes of chronic lymphedema.  CBC Lab Results  Component Value Date   WBC 5.1 11/19/2016   HGB 12.8 (L) 11/19/2016   HCT 39.2 (L) 11/19/2016   MCV 84.7 11/19/2016   PLT 154 11/19/2016    BMET    Component Value Date/Time   NA 142 11/19/2016 0513   K 4.2 11/19/2016 0513   CL 107 11/19/2016 0513   CO2 31 11/19/2016 0513   GLUCOSE 102 (H) 11/19/2016 0513   BUN 22 (H) 12/08/2016 1001   BUN 16 07/03/2014 1205   CREATININE 1.29 (H) 12/08/2016 1001   CREATININE 1.34 (H) 07/03/2014 1205   CALCIUM 9.0 11/19/2016 0513   GFRNONAA 60 (L) 12/08/2016 1001   GFRNONAA 59 (L) 07/03/2014 1205   GFRAA >60 12/08/2016 1001   GFRAA >60 07/03/2014 1205   CrCl cannot be calculated (Patient's most recent lab result is older than the maximum 21 days allowed.).  COAG Lab Results  Component Value Date   INR 1.11 06/25/2016   INR 1.90 05/12/2016   INR 1.20 12/05/2015    Radiology No results found.   Assessment/Plan 1. Atherosclerosis of lower extremity with claudication ([place])  Recommend:  The patient has evidence of atherosclerosis of the lower extremities with claudication.  The patient does not voice lifestyle limiting changes at this point in time.  Noninvasive studies do not suggest clinically significant change.  No invasive studies, angiography or  surgery at this time The patient should continue walking and begin a more formal exercise program.  The patient should continue antiplatelet therapy and aggressive treatment of the lipid abnormalities  No changes in the patient's medications at this time  The patient should continue wearing graduated compression socks 10-15 mmHg strength to control the mild edema.   - VAS [place] ABI WITH/WO TBI; Future  2. Mixed hyperlipidemia Continue statin as ordered and reviewed, no changes at this time   3. Essential hypertension Continue antihypertensive medications as already ordered, these medications have been reviewed and there are no changes at this time.     [name], MD  04/15/2018 2:49 PM

## 2018-08-02 ENCOUNTER — Telehealth (INDEPENDENT_AMBULATORY_CARE_PROVIDER_SITE_OTHER): Payer: Self-pay | Admitting: Vascular Surgery

## 2018-08-02 ENCOUNTER — Telehealth (INDEPENDENT_AMBULATORY_CARE_PROVIDER_SITE_OTHER): Payer: Self-pay

## 2018-08-02 NOTE — Telephone Encounter (Signed)
Please advise on below  

## 2018-08-02 NOTE — Telephone Encounter (Signed)
See below

## 2018-08-02 NOTE — Telephone Encounter (Signed)
error 

## 2018-08-02 NOTE — Telephone Encounter (Signed)
Have him come in for a DVT study of the left leg.

## 2018-08-03 NOTE — Telephone Encounter (Signed)
I have tried calling the patient to schedule, he has no voice mail.

## 2018-08-03 NOTE — Telephone Encounter (Signed)
I have made multiple call this morning to both numbers in system and I am not able to get in contact with the patient.

## 2018-08-04 ENCOUNTER — Other Ambulatory Visit (INDEPENDENT_AMBULATORY_CARE_PROVIDER_SITE_OTHER): Payer: Self-pay | Admitting: Nurse Practitioner

## 2018-08-04 ENCOUNTER — Other Ambulatory Visit (INDEPENDENT_AMBULATORY_CARE_PROVIDER_SITE_OTHER): Payer: Self-pay | Admitting: Vascular Surgery

## 2018-08-04 DIAGNOSIS — M79605 Pain in left leg: Secondary | ICD-10-CM

## 2018-08-05 ENCOUNTER — Ambulatory Visit (INDEPENDENT_AMBULATORY_CARE_PROVIDER_SITE_OTHER): Payer: Self-pay

## 2018-08-05 ENCOUNTER — Encounter (INDEPENDENT_AMBULATORY_CARE_PROVIDER_SITE_OTHER): Payer: Self-pay | Admitting: Nurse Practitioner

## 2018-08-05 ENCOUNTER — Ambulatory Visit (INDEPENDENT_AMBULATORY_CARE_PROVIDER_SITE_OTHER): Payer: Medicaid Other | Admitting: Nurse Practitioner

## 2018-08-05 VITALS — BP 144/85 | HR 68 | Resp 16 | Ht 72.0 in | Wt 213.4 lb

## 2018-08-05 DIAGNOSIS — I70219 Atherosclerosis of native arteries of extremities with intermittent claudication, unspecified extremity: Secondary | ICD-10-CM

## 2018-08-05 DIAGNOSIS — M79604 Pain in right leg: Secondary | ICD-10-CM

## 2018-08-05 DIAGNOSIS — M79605 Pain in left leg: Secondary | ICD-10-CM

## 2018-08-05 DIAGNOSIS — E782 Mixed hyperlipidemia: Secondary | ICD-10-CM

## 2018-08-05 MED ORDER — CLOPIDOGREL BISULFATE 75 MG PO TABS: 75.0000 mg | ORAL_TABLET | Freq: Every day | ORAL | 3 refills | Status: DC

## 2018-08-09 ENCOUNTER — Encounter (INDEPENDENT_AMBULATORY_CARE_PROVIDER_SITE_OTHER): Payer: Self-pay | Admitting: Nurse Practitioner

## 2018-08-09 NOTE — Progress Notes (Signed)
Subjective:    Patient ID: Joshua [name], male    DOB: Sep 05, 1955, 63 y.o.   MRN: [id number] Chief Complaint  Patient presents with  . Follow-up  . Medication Refill    HPI  Joshua [name] is a 63 y.o. male that presents today due to pain after moving a couch over the weekend.  He states that he feels his leg is swollen and painful after this.  He has had a previous history of DVTs and is concerned that he may have another one at this time.  He denies any chest pain or shortness of breath.  He denies any prolonged periods of immobility.  He denies any trauma to his lower extremities.  He denies any recent episodes of dehydration.  He denies any fever, chills, nausea, vomiting or diarrhea.  He denies any TIA-like symptoms or amaurosis fugax.   He has had numerous peripheral vascular interventions.  He is interventions have been primarily on his right lower extremity. He underwent a left lower extremity DVT study which revealed no evidence of DVT.  Past Medical History:  Diagnosis Date  . Dyspnea    former smoker. only doe  . Hyperlipidemia   . Hypertension   . Peripheral vascular disease ([place])   . Pre-diabetes     Past Surgical History:  Procedure Laterality Date  . BACK SURGERY  2011  . CENTRAL LINE INSERTION Right 09/10/2016   Procedure: CENTRAL LINE INSERTION;  Surgeon: Katha Cabal, MD;  Location: [facility];  Service: Vascular;  Laterality: Right;  . COLONOSCOPY  2013 ?  . FEMORAL-POPLITEAL BYPASS GRAFT Right 09/10/2016   Procedure: BYPASS GRAFT FEMORAL-POPLITEAL ARTERY ( BELOW KNEE );  Surgeon: Katha Cabal, MD;  Location: [facility];  Service: Vascular;  Laterality: Right;  . LOWER EXTREMITY ANGIOGRAPHY Right 11/18/2016   Procedure: Lower Extremity Angiography;  Surgeon: Katha Cabal, MD;  Location: [place] [facility];  Service: Cardiovascular;  Laterality: Right;  . LOWER EXTREMITY ANGIOGRAPHY Right 12/09/2016   Procedure: Lower Extremity  Angiography;  Surgeon: Katha Cabal, MD;  Location: [place] [facility];  Service: Cardiovascular;  Laterality: Right;  . LOWER EXTREMITY INTERVENTION  11/18/2016   Procedure: Lower Extremity Intervention;  Surgeon: Katha Cabal, MD;  Location: [place] [facility];  Service: Cardiovascular;;  . PERIPHERAL VASCULAR CATHETERIZATION Right 01/02/2015   Procedure: Lower Extremity Angiography;  Surgeon: Katha Cabal, MD;  Location: [place] [facility];  Service: Cardiovascular;  Laterality: Right;  . PERIPHERAL VASCULAR CATHETERIZATION Right 06/18/2015   Procedure: Lower Extremity Angiography;  Surgeon: Algernon Huxley, MD;  Location: [place] [facility];  Service: Cardiovascular;  Laterality: Right;  . PERIPHERAL VASCULAR CATHETERIZATION  06/18/2015   Procedure: Lower Extremity Intervention;  Surgeon: Algernon Huxley, MD;  Location: [place] [facility];  Service: Cardiovascular;;  . PERIPHERAL VASCULAR CATHETERIZATION N/A 10/09/2015   Procedure: Abdominal Aortogram w/Lower Extremity;  Surgeon: Katha Cabal, MD;  Location: [place] [facility];  Service: Cardiovascular;  Laterality: N/A;  . PERIPHERAL VASCULAR CATHETERIZATION  10/09/2015   Procedure: Lower Extremity Intervention;  Surgeon: Katha Cabal, MD;  Location: [place] [facility];  Service: Cardiovascular;;  . PERIPHERAL VASCULAR CATHETERIZATION Right 10/10/2015   Procedure: Lower Extremity Angiography;  Surgeon: Algernon Huxley, MD;  Location: [place] [facility];  Service: Cardiovascular;  Laterality: Right;  . PERIPHERAL VASCULAR CATHETERIZATION Right 10/10/2015   Procedure: Lower Extremity Intervention;  Surgeon: Algernon Huxley, MD;  Location: [facility]  IN[facility];  Service: Cardiovascular;  Laterality: Right;  . PERIPHERAL VASCULAR CATHETERIZATION Right 12/03/2015   Procedure: Lower Extremity Angiography;  Surgeon: Algernon Huxley, MD;  Location: [place] [facility];  Service: Cardiovascular;  Laterality: Right;  . PERIPHERAL  VASCULAR CATHETERIZATION Right 12/04/2015   Procedure: Lower Extremity Angiography;  Surgeon: Algernon Huxley, MD;  Location: [place] [facility];  Service: Cardiovascular;  Laterality: Right;  . PERIPHERAL VASCULAR CATHETERIZATION  12/04/2015   Procedure: Lower Extremity Intervention;  Surgeon: Algernon Huxley, MD;  Location: [place] [facility];  Service: Cardiovascular;;  . PERIPHERAL VASCULAR CATHETERIZATION Right 06/30/2016   Procedure: Lower Extremity Angiography;  Surgeon: Algernon Huxley, MD;  Location: [place] [facility];  Service: Cardiovascular;  Laterality: Right;  . PERIPHERAL VASCULAR CATHETERIZATION Right 06/25/2016   Procedure: Lower Extremity Angiography;  Surgeon: Katha Cabal, MD;  Location: [place] [facility];  Service: Cardiovascular;  Laterality: Right;  . PERIPHERAL VASCULAR CATHETERIZATION Right 07/01/2016   Procedure: Lower Extremity Angiography;  Surgeon: Katha Cabal, MD;  Location: [place] [facility];  Service: Cardiovascular;  Laterality: Right;  . PERIPHERAL VASCULAR CATHETERIZATION  07/01/2016   Procedure: Lower Extremity Intervention;  Surgeon: Katha Cabal, MD;  Location: [place] [facility];  Service: Cardiovascular;;  . PERIPHERAL VASCULAR CATHETERIZATION Right 08/01/2016   Procedure: Lower Extremity Angiography;  Surgeon: Katha Cabal, MD;  Location: [place] [facility];  Service: Cardiovascular;  Laterality: Right;  . stent placement in right leg Right     Social History   Socioeconomic History  . Marital status: Single    Spouse name: Not on file  . Number of children: Not on file  . Years of education: Not on file  . Highest education level: Not on file  Occupational History  . Not on file  Social Needs  . Financial resource strain: Not on file  . Food insecurity:    Worry: Not on file    Inability: Not on file  . Transportation needs:    Medical: Not on file    Non-medical: Not on file  Tobacco Use  . Smoking status: Former  Smoker    Packs/day: 1.50    Types: Cigarettes    Last attempt to quit: 12/20/2014    Years since quitting: 3.6  . Smokeless tobacco: Former Network engineer and Sexual Activity  . Alcohol use: No  . Drug use: No  . Sexual activity: Yes  Lifestyle  . Physical activity:    Days per week: Not on file    Minutes per session: Not on file  . Stress: Not on file  Relationships  . Social connections:    Talks on phone: Not on file    Gets together: Not on file    Attends religious service: Not on file    Active member of club or organization: Not on file    Attends meetings of clubs or organizations: Not on file    Relationship status: Not on file  . Intimate partner violence:    Fear of current or ex partner: Not on file    Emotionally abused: Not on file    Physically abused: Not on file    Forced sexual activity: Not on file  Other Topics Concern  . Not on file  Social History Narrative  . Not on file    No family history on file.  No Known Allergies   Review of Systems   Review of Systems: Negative  Unless Checked Constitutional: [] Weight loss  [] Fever  [] Chills Cardiac: [] Chest pain   []  Atrial Fibrillation  [] Palpitations   [] Shortness of breath when laying flat   [] Shortness of breath with exertion. [] Shortness of breath at rest Vascular:  [x] Pain in legs with walking   [x] Pain in legs with standing [] Pain in legs when laying flat   [x] Claudication    [] Pain in feet when laying flat    [x] History of DVT   [] Phlebitis   [] Swelling in legs   [] Varicose veins   [] Non-healing ulcers Pulmonary:   [] Uses home oxygen   [] Productive cough   [] Hemoptysis   [] Wheeze  [] COPD   [] Asthma Neurologic:  [] Dizziness   [] Seizures  [] Blackouts [] History of stroke   [] History of TIA  [] Aphasia   [] Temporary Blindness   [] Weakness or numbness in arm   [] Weakness or numbness in leg Musculoskeletal:   [] Joint swelling   [] Joint pain   [] Low back pain  []  History of Knee Replacement [] Arthritis  [] back Surgeries  []  Spinal Stenosis    Hematologic:  [] Easy bruising  [] Easy bleeding   [] Hypercoagulable state   [] Anemic Gastrointestinal:  [] Diarrhea   [] Vomiting  [] Gastroesophageal reflux/heartburn   [] Difficulty swallowing. [] Abdominal pain Genitourinary:  [] Chronic kidney disease   [] Difficult urination  [] Anuric   [] Blood in urine [] Frequent urination  [] Burning with urination   [] Hematuria Skin:  [] Rashes   [] Ulcers [] Wounds Psychological:  [] History of anxiety   []  History of major depression  []  Memory Difficulties     Objective:   Physical Exam  BP (!) 144/85 (BP Location: Left Arm, Patient Position: Sitting, Cuff Size: Normal)   Pulse 68   Resp 16   Ht 6' (1.829 m)   Wt 213 lb 6.4 oz (96.8 kg)   BMI 28.94 kg/m   Gen: WD/WN, NAD Head: [place]/AT, No temporalis wasting.  Ear/Nose/Throat: Hearing grossly intact, nares w/o erythema or drainage Eyes: PER, EOMI, sclera nonicteric.  Neck: Supple, no masses.  No JVD.  Pulmonary:  Good air movement, no use of accessory muscles.  Cardiac: RRR Vascular:  Vessel Right Left  Radial Palpable Palpable   Gastrointestinal: soft, non-distended. No guarding/no peritoneal signs.  Musculoskeletal: M/S 5/5 throughout.  Using a cane walking with a limp Neurologic: Pain and light touch intact in extremities.  Symmetrical.  Speech is fluent. Motor exam as listed above. Psychiatric: Judgment intact, Mood & affect appropriate for pt's clinical situation. Dermatologic: No Venous rashes. No Ulcers Noted.  No changes consistent with cellulitis. Lymph : No Cervical lymphadenopathy, no lichenification or skin changes of chronic lymphedema.      Assessment & Plan:   1. Atherosclerosis of lower extremity with claudication [facility]) Patient continues to deny claudication-like symptoms at this time.  Patient has requested a refill of his Plavix, with 90-day supply this time.  He will continue on his regularly scheduled follow-up for his peripheral  vascular disease. - clopidogrel (PLAVIX) 75 MG tablet; Take 1 tablet (75 mg total) by mouth daily.  Dispense: 90 tablet; Refill: 3  2. Pain of right lower extremity He is negative for DVT.  This is likely some sort of muscle strain or back strain.  I have recommended that he follow-up with his primary care provider for further work-up and assessment.  In the meantime, I recommend that he used to rest, as well as alternating ice and heat packs on the sore areas.  I also recommended taking ibuprofen every 6-8 hours.  3. Mixed hyperlipidemia Continue statin as  ordered and reviewed, no changes at this time    Current Outpatient Medications on File Prior to Visit  Medication Sig Dispense Refill  . aspirin EC 81 MG tablet Take 81 mg by mouth daily.    . simvastatin (ZOCOR) 20 MG tablet TAKE 1 TABLET BY MOUTH DAILY 90 tablet 3  . acetaminophen (TYLENOL) 650 MG CR tablet Take 650 mg by mouth daily as needed for pain.    Marland Kitchen ELIQUIS 5 MG TABS tablet TAKE 1 TABLET BY MOUTH TWICE DAILY (Patient not taking: Reported on 04/09/2017) 60 tablet 0  . hydrochlorothiazide (HYDRODIURIL) 12.5 MG tablet TAKE 1 TABLET(12.5 MG) BY MOUTH EVERY DAY    . lisinopril (PRINIVIL,ZESTRIL) 20 MG tablet Take 20 mg by mouth daily.     No current facility-administered medications on file prior to visit.     There are no Patient Instructions on file for this visit. No follow-ups on file.   Kris Hartmann, NP  This note was completed with Sales executive.  Any errors are purely unintentional.

## 2018-10-18 ENCOUNTER — Encounter (INDEPENDENT_AMBULATORY_CARE_PROVIDER_SITE_OTHER): Payer: Self-pay

## 2018-10-18 ENCOUNTER — Ambulatory Visit (INDEPENDENT_AMBULATORY_CARE_PROVIDER_SITE_OTHER): Payer: Medicaid Other | Admitting: Vascular Surgery

## 2019-02-23 DIAGNOSIS — H2513 Age-related nuclear cataract, bilateral: Secondary | ICD-10-CM | POA: Diagnosis not present

## 2019-03-09 ENCOUNTER — Other Ambulatory Visit (INDEPENDENT_AMBULATORY_CARE_PROVIDER_SITE_OTHER): Payer: Self-pay | Admitting: Nurse Practitioner

## 2019-03-09 ENCOUNTER — Other Ambulatory Visit (INDEPENDENT_AMBULATORY_CARE_PROVIDER_SITE_OTHER): Payer: Self-pay | Admitting: Vascular Surgery

## 2019-03-09 DIAGNOSIS — I70219 Atherosclerosis of native arteries of extremities with intermittent claudication, unspecified extremity: Secondary | ICD-10-CM

## 2019-03-09 MED ORDER — CLOPIDOGREL BISULFATE 75 MG PO TABS: 75.0000 mg | ORAL_TABLET | Freq: Every day | ORAL | 6 refills | Status: DC

## 2019-04-07 ENCOUNTER — Ambulatory Visit (INDEPENDENT_AMBULATORY_CARE_PROVIDER_SITE_OTHER): Payer: Medicare Other | Admitting: Vascular Surgery

## 2019-04-07 ENCOUNTER — Encounter (INDEPENDENT_AMBULATORY_CARE_PROVIDER_SITE_OTHER): Payer: Self-pay

## 2019-04-07 ENCOUNTER — Ambulatory Visit (INDEPENDENT_AMBULATORY_CARE_PROVIDER_SITE_OTHER): Payer: Medicare Other

## 2019-04-07 ENCOUNTER — Other Ambulatory Visit: Payer: Self-pay

## 2019-04-07 ENCOUNTER — Encounter (INDEPENDENT_AMBULATORY_CARE_PROVIDER_SITE_OTHER): Payer: Self-pay | Admitting: Vascular Surgery

## 2019-04-07 VITALS — BP 144/79 | HR 74 | Resp 12 | Ht 72.0 in | Wt 211.0 lb

## 2019-04-07 DIAGNOSIS — E782 Mixed hyperlipidemia: Secondary | ICD-10-CM | POA: Diagnosis not present

## 2019-04-07 DIAGNOSIS — I10 Essential (primary) hypertension: Secondary | ICD-10-CM

## 2019-04-07 DIAGNOSIS — I70219 Atherosclerosis of native arteries of extremities with intermittent claudication, unspecified extremity: Secondary | ICD-10-CM | POA: Diagnosis not present

## 2019-04-07 DIAGNOSIS — I743 Embolism and thrombosis of arteries of the lower extremities: Secondary | ICD-10-CM

## 2019-04-07 NOTE — Progress Notes (Signed)
MRN : [id number]  Joshua [name] is a 63 y.o. ([birth year]) male who presents with chief complaint of No chief complaint on file. Marland Kitchen  History of Present Illness:   The patient returns to the office for followup and review of the noninvasive studies.   On 09/10/2016 he underwent Right common femoral artery to below knee popliteal artery bypass with in situ great saphenous vein.  On 12/09/2016 a Viabahn stent is placed in the bypass.  There have been no interval changes in lower extremity symptoms. No interval shortening of the patient's claudication distance or development of rest pain symptoms. No new ulcers or wounds have occurred since the last visit.  There have been no significant changes to the patient's overall health care.  The patient denies amaurosis fugax or recent TIA symptoms. There are no recent neurological changes noted. The patient denies history of DVT, PE or superficial thrombophlebitis. The patient denies recent episodes of angina or shortness of breath.   ABI Rt=0.93and Lt=1.07(previous ABI Rt=0.89and Lt=1.09)  Previous duplex ultrasound of thebypass remains patent with a stable mild mid graft stenosis  No outpatient medications have been marked as taking for the 04/07/19 encounter (Appointment) with Delana Meyer, Dolores Lory, MD.    Past Medical History:  Diagnosis Date  . Dyspnea    former smoker. only doe  . Hyperlipidemia   . Hypertension   . Peripheral vascular disease ([place])   . Pre-diabetes     Past Surgical History:  Procedure Laterality Date  . BACK SURGERY  2011  . CENTRAL LINE INSERTION Right 09/10/2016   Procedure: CENTRAL LINE INSERTION;  Surgeon: Katha Cabal, MD;  Location: [facility];  Service: Vascular;  Laterality: Right;  . COLONOSCOPY  2013 ?  . FEMORAL-POPLITEAL BYPASS GRAFT Right 09/10/2016   Procedure: BYPASS GRAFT FEMORAL-POPLITEAL ARTERY ( BELOW KNEE );  Surgeon: Katha Cabal, MD;  Location: [facility];  Service:  Vascular;  Laterality: Right;  . LOWER EXTREMITY ANGIOGRAPHY Right 11/18/2016   Procedure: Lower Extremity Angiography;  Surgeon: Katha Cabal, MD;  Location: [place] [facility];  Service: Cardiovascular;  Laterality: Right;  . LOWER EXTREMITY ANGIOGRAPHY Right 12/09/2016   Procedure: Lower Extremity Angiography;  Surgeon: Katha Cabal, MD;  Location: [place] [facility];  Service: Cardiovascular;  Laterality: Right;  . LOWER EXTREMITY INTERVENTION  11/18/2016   Procedure: Lower Extremity Intervention;  Surgeon: Katha Cabal, MD;  Location: [place] [facility];  Service: Cardiovascular;;  . PERIPHERAL VASCULAR CATHETERIZATION Right 01/02/2015   Procedure: Lower Extremity Angiography;  Surgeon: Katha Cabal, MD;  Location: [place] [facility];  Service: Cardiovascular;  Laterality: Right;  . PERIPHERAL VASCULAR CATHETERIZATION Right 06/18/2015   Procedure: Lower Extremity Angiography;  Surgeon: Algernon Huxley, MD;  Location: [place] [facility];  Service: Cardiovascular;  Laterality: Right;  . PERIPHERAL VASCULAR CATHETERIZATION  06/18/2015   Procedure: Lower Extremity Intervention;  Surgeon: Algernon Huxley, MD;  Location: [place] [facility];  Service: Cardiovascular;;  . PERIPHERAL VASCULAR CATHETERIZATION N/A 10/09/2015   Procedure: Abdominal Aortogram w/Lower Extremity;  Surgeon: Katha Cabal, MD;  Location: [place] [facility];  Service: Cardiovascular;  Laterality: N/A;  . PERIPHERAL VASCULAR CATHETERIZATION  10/09/2015   Procedure: Lower Extremity Intervention;  Surgeon: Katha Cabal, MD;  Location: [place] [facility];  Service: Cardiovascular;;  . PERIPHERAL VASCULAR CATHETERIZATION Right 10/10/2015   Procedure: Lower Extremity Angiography;  Surgeon: Algernon Huxley, MD;  Location: [place] [facility];  Service: Cardiovascular;  Laterality: Right;  . PERIPHERAL VASCULAR CATHETERIZATION Right 10/10/2015   Procedure: Lower Extremity Intervention;  Surgeon: Algernon Huxley,  MD;  Location: [place] [facility];  Service: Cardiovascular;  Laterality: Right;  . PERIPHERAL VASCULAR CATHETERIZATION Right 12/03/2015   Procedure: Lower Extremity Angiography;  Surgeon: Algernon Huxley, MD;  Location: [place] [facility];  Service: Cardiovascular;  Laterality: Right;  . PERIPHERAL VASCULAR CATHETERIZATION Right 12/04/2015   Procedure: Lower Extremity Angiography;  Surgeon: Algernon Huxley, MD;  Location: [place] [facility];  Service: Cardiovascular;  Laterality: Right;  . PERIPHERAL VASCULAR CATHETERIZATION  12/04/2015   Procedure: Lower Extremity Intervention;  Surgeon: Algernon Huxley, MD;  Location: [place] [facility];  Service: Cardiovascular;;  . PERIPHERAL VASCULAR CATHETERIZATION Right 06/30/2016   Procedure: Lower Extremity Angiography;  Surgeon: Algernon Huxley, MD;  Location: [place] [facility];  Service: Cardiovascular;  Laterality: Right;  . PERIPHERAL VASCULAR CATHETERIZATION Right 06/25/2016   Procedure: Lower Extremity Angiography;  Surgeon: Katha Cabal, MD;  Location: [place] [facility];  Service: Cardiovascular;  Laterality: Right;  . PERIPHERAL VASCULAR CATHETERIZATION Right 07/01/2016   Procedure: Lower Extremity Angiography;  Surgeon: Katha Cabal, MD;  Location: [place] [facility];  Service: Cardiovascular;  Laterality: Right;  . PERIPHERAL VASCULAR CATHETERIZATION  07/01/2016   Procedure: Lower Extremity Intervention;  Surgeon: Katha Cabal, MD;  Location: [place] [facility];  Service: Cardiovascular;;  . PERIPHERAL VASCULAR CATHETERIZATION Right 08/01/2016   Procedure: Lower Extremity Angiography;  Surgeon: Katha Cabal, MD;  Location: [name] [facility];  Service: Cardiovascular;  Laterality: Right;  . stent placement in right leg Right     Social History Social History   Tobacco Use  . Smoking status: Former Smoker    Packs/day: 1.50    Types: Cigarettes    Quit date: 12/20/2014    Years since quitting: 4.2  . Smokeless tobacco:  Former Network engineer Use Topics  . Alcohol use: No  . Drug use: No    Family History No family history on file.  No Known Allergies   REVIEW OF SYSTEMS (Negative unless checked)  Constitutional: [] Weight loss  [] Fever  [] Chills Cardiac: [] Chest pain   [] Chest pressure   [] Palpitations   [] Shortness of breath when laying flat   [] Shortness of breath with exertion. Vascular:  [] Pain in legs with walking   [x] Pain in legs at rest  [] History of DVT   [] Phlebitis   [] Swelling in legs   [] Varicose veins   [] Non-healing ulcers Pulmonary:   [] Uses home oxygen   [] Productive cough   [] Hemoptysis   [] Wheeze  [] COPD   [] Asthma Neurologic:  [] Dizziness   [] Seizures   [] History of stroke   [] History of TIA  [] Aphasia   [] Vissual changes   [] Weakness or numbness in arm   [] Weakness or numbness in leg Musculoskeletal:   [] Joint swelling   [x] Joint pain   [x] Low back pain Hematologic:  [] Easy bruising  [] Easy bleeding   [] Hypercoagulable state   [] Anemic Gastrointestinal:  [] Diarrhea   [] Vomiting  [] Gastroesophageal reflux/heartburn   [] Difficulty swallowing. Genitourinary:  [] Chronic kidney disease   [] Difficult urination  [] Frequent urination   [] Blood in urine Skin:  [] Rashes   [] Ulcers  Psychological:  [] History of anxiety   []  History of major depression.  Physical Examination  There were no vitals filed for this visit. There is no height or weight on file to calculate BMI. Gen: WD/WN, NAD walks with  a cane Head: [place]/AT, No temporalis wasting.  Ear/Nose/Throat: Hearing grossly intact, nares w/o erythema or drainage Eyes: PER, EOMI, sclera nonicteric.  Neck: Supple, no large masses.   Pulmonary:  Good air movement, no audible wheezing bilaterally, no use of accessory muscles.  Cardiac: RRR, no JVD Vascular:  Vessel Right Left  PT Palpable Palpable  DP Not Palpable Palpable  Gastrointestinal: Non-distended. No guarding/no peritoneal signs.  Musculoskeletal: M/S 5/5 throughout.  No  deformity or atrophy.  Neurologic: CN 2-12 intact. Symmetrical.  Speech is fluent. Motor exam as listed above. Psychiatric: Judgment intact, Mood & affect appropriate for pt's clinical situation. Dermatologic: No rashes or ulcers noted.  No changes consistent with cellulitis. Lymph : No lichenification or skin changes of chronic lymphedema.  CBC Lab Results  Component Value Date   WBC 5.1 11/19/2016   HGB 12.8 (L) 11/19/2016   HCT 39.2 (L) 11/19/2016   MCV 84.7 11/19/2016   PLT 154 11/19/2016    BMET    Component Value Date/Time   NA 142 11/19/2016 0513   K 4.2 11/19/2016 0513   CL 107 11/19/2016 0513   CO2 31 11/19/2016 0513   GLUCOSE 102 (H) 11/19/2016 0513   BUN 22 (H) 12/08/2016 1001   BUN 16 07/03/2014 1205   CREATININE 1.29 (H) 12/08/2016 1001   CREATININE 1.34 (H) 07/03/2014 1205   CALCIUM 9.0 11/19/2016 0513   GFRNONAA 60 (L) 12/08/2016 1001   GFRNONAA 59 (L) 07/03/2014 1205   GFRAA >60 12/08/2016 1001   GFRAA >60 07/03/2014 1205   CrCl cannot be calculated (Patient's most recent lab result is older than the maximum 21 days allowed.).  COAG Lab Results  Component Value Date   INR 1.11 06/25/2016   INR 1.90 05/12/2016   INR 1.20 12/05/2015    Radiology No results found.    Assessment/Plan 1. Atherosclerosis of lower extremity with claudication ([place])  Recommend:  The patient has evidence of atherosclerosis of the lower extremities with claudication.  The patient does not voice lifestyle limiting changes at this point in time.  Noninvasive studies do not suggest clinically significant change.  No invasive studies, angiography or surgery at this time The patient should continue walking and begin a more formal exercise program.  The patient should continue antiplatelet therapy and aggressive treatment of the lipid abnormalities  No changes in the patient's medications at this time  The patient should continue wearing graduated compression socks 10-15  mmHg strength to control the mild edema.   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  The patient does not voice lifestyle limiting changes at this point in time.  Noninvasive studies do not suggest clinically significant change.  No invasive studies, angiography or surgery at this time The patient should continue walking and begin a more formal exercise program.  The patient should continue antiplatelet therapy and aggressive treatment of the lipid abnormalities  No changes in the patient's medications at this time  The patient should continue wearing graduated compression socks 10-15 mmHg strength to control the mild edema.    3. Essential hypertension Continue antihypertensive medications as already ordered, these medications have been reviewed and there are no changes at this time.   4. Mixed hyperlipidemia Continue statin as ordered and reviewed, no changes at this time  [name], MD  04/07/2019 7:51 AM

## 2019-04-13 ENCOUNTER — Encounter: Payer: Self-pay | Admitting: Family Medicine

## 2019-04-13 ENCOUNTER — Ambulatory Visit
Admission: RE | Admit: 2019-04-13 | Discharge: 2019-04-13 | Disposition: A | Discharge status: Home / Self Care | Payer: Medicare Other | Source: Ambulatory Visit | Attending: Family Medicine | Admitting: Family Medicine

## 2019-04-13 ENCOUNTER — Ambulatory Visit (INDEPENDENT_AMBULATORY_CARE_PROVIDER_SITE_OTHER): Payer: Medicare Other | Admitting: Family Medicine

## 2019-04-13 ENCOUNTER — Other Ambulatory Visit: Payer: Self-pay

## 2019-04-13 VITALS — BP 155/82 | HR 82 | Temp 96.8°F | Resp 16 | Ht 71.5 in | Wt 213.0 lb

## 2019-04-13 DIAGNOSIS — Z9889 Other specified postprocedural states: Secondary | ICD-10-CM | POA: Diagnosis not present

## 2019-04-13 DIAGNOSIS — Z23 Encounter for immunization: Secondary | ICD-10-CM

## 2019-04-13 DIAGNOSIS — R7303 Prediabetes: Secondary | ICD-10-CM

## 2019-04-13 DIAGNOSIS — G8929 Other chronic pain: Secondary | ICD-10-CM | POA: Diagnosis not present

## 2019-04-13 DIAGNOSIS — M545 Low back pain, unspecified: Secondary | ICD-10-CM

## 2019-04-13 DIAGNOSIS — Z1159 Encounter for screening for other viral diseases: Secondary | ICD-10-CM | POA: Diagnosis not present

## 2019-04-13 DIAGNOSIS — E782 Mixed hyperlipidemia: Secondary | ICD-10-CM | POA: Diagnosis not present

## 2019-04-13 DIAGNOSIS — E663 Overweight: Secondary | ICD-10-CM | POA: Insufficient documentation

## 2019-04-13 DIAGNOSIS — I10 Essential (primary) hypertension: Secondary | ICD-10-CM

## 2019-04-13 DIAGNOSIS — M47816 Spondylosis without myelopathy or radiculopathy, lumbar region: Secondary | ICD-10-CM | POA: Diagnosis not present

## 2019-04-13 DIAGNOSIS — Z79899 Other long term (current) drug therapy: Secondary | ICD-10-CM | POA: Diagnosis not present

## 2019-04-13 DIAGNOSIS — I739 Peripheral vascular disease, unspecified: Secondary | ICD-10-CM

## 2019-04-13 DIAGNOSIS — Z114 Encounter for screening for human immunodeficiency virus [HIV]: Secondary | ICD-10-CM

## 2019-04-13 MED ORDER — LISINOPRIL 10 MG PO TABS: 10.0000 mg | ORAL_TABLET | Freq: Every day | ORAL | 1 refills | Status: DC

## 2019-04-13 MED ORDER — SIMVASTATIN 20 MG PO TABS: 20.0000 mg | ORAL_TABLET | Freq: Every day | ORAL | 1 refills | Status: DC

## 2019-04-13 NOTE — Assessment & Plan Note (Addendum)
Followed by vascular surgery Status post femoropopliteal bypass in right leg Continue dual antiplatelet therapy Discussed importance of good blood pressure control and statin therapy

## 2019-04-13 NOTE — Assessment & Plan Note (Signed)
Discussed importance of healthy weight management Discussed diet and exercise  

## 2019-04-13 NOTE — Assessment & Plan Note (Signed)
History of spinal surgery now with back pain Check lumbar spine x-ray to ensure hardware is intact No radicular symptoms or red flag symptoms

## 2019-04-13 NOTE — Assessment & Plan Note (Signed)
Worsening back pain over the last 6 months with history of back surgery Check lumbar x-ray

## 2019-04-13 NOTE — Assessment & Plan Note (Signed)
Discussed importance of lipid control in reducing the progression of atherosclerotic disease Resume statin therapy Check fasting lipid panel and CMP Goal LDL less than 70

## 2019-04-13 NOTE — Assessment & Plan Note (Signed)
Uncontrolled today and recently at vascular surgery office Discussed importance of blood pressure control in reducing the progression of atherosclerotic disease Resume lisinopril at 10 mg daily Recheck metabolic panel Follow-up in 1 month and consider dose titration

## 2019-04-13 NOTE — Patient Instructions (Signed)

## 2019-04-13 NOTE — Assessment & Plan Note (Signed)
Discussed low-carb diet Recheck A1c

## 2019-04-13 NOTE — Progress Notes (Signed)
Patient: Joshua [name], Male    DOB: [birth year], 63 y.o.   MRN: [id number] Visit Date: 04/13/2019  Today's Provider: Lavon Paganini, MD   Chief Complaint  Patient presents with  . New Patient (Initial Visit)   Subjective:    New Patient  Joshua [name] is a 63 y.o. male who presents today for health maintenance and establish patient care, patient was a former patient at [facility]. He feels fairly well, patient reports that he has been more fatigued than usual. He reports he is not actively exercising . He reports he is sleeping fairly well.  History of back surgery in 2011 (L5).  In December of 2019 was moving a couch and felt pull in back.  Left leg became painful.  Dr Ronalee Belts reassured that not PAD.  Intermittent pain after sitting for long periods of time. No numbness/weakness  PAD, s/p Fem-pop bypass in R leg.  Followed regularly by Dr Delana Meyer.  Taking ASA and Plavix  Colonoscopy in 2013 - he thinks it was [facility]  HTN: - Medications: previously on lisinopril 20mg  daily - Compliance: self-discontinued many months ago whne noted home BP 90s/70s (unsure how accurate his cuff is) - Checking BP at home: occasionally - Denies any SOB, CP, vision changes, LE edema, medication SEs, or symptoms of hypotension - Diet: low sodium  HLD - medications: previously on simvastatin - compliance: self d/c'd - medication SEs: none  -----------------------------------------------------------------   Review of Systems  Constitutional: Positive for activity change and fatigue.  Respiratory: Positive for chest tightness and shortness of breath.   Cardiovascular: Positive for leg swelling.  Genitourinary: Positive for difficulty urinating.  Musculoskeletal: Positive for arthralgias and back pain.  Allergic/Immunologic: Positive for environmental allergies.  Neurological: Positive for light-headedness and numbness.  Hematological: Bruises/bleeds  easily.  All other systems reviewed and are negative.   Social History He  reports that he quit smoking about 4 years ago. His smoking use included cigarettes. He has a 57.00 pack-year smoking history. He has quit using smokeless tobacco. He reports previous alcohol use. He reports that he does not use drugs. Social History   Socioeconomic History  . Marital status: Single    Spouse name: Not on file  . Number of children: Not on file  . Years of education: Not on file  . Highest education level: Not on file  Occupational History  . Occupation: unemployed  Social Needs  . Financial resource strain: Not on file  . Food insecurity    Worry: Not on file    Inability: Not on file  . Transportation needs    Medical: Not on file    Non-medical: Not on file  Tobacco Use  . Smoking status: Former Smoker    Packs/day: 1.50    Years: 38.00    Pack years: 57.00    Types: Cigarettes    Quit date: 12/20/2014    Years since quitting: 4.3  . Smokeless tobacco: Former Network engineer and Sexual Activity  . Alcohol use: Not Currently  . Drug use: No  . Sexual activity: Yes  Lifestyle  . Physical activity    Days per week: Not on file    Minutes per session: Not on file  . Stress: Not on file  Relationships  . Social Herbalist on phone: Not on file    Gets together: Not on file    Attends religious service: Not on file  Active member of club or organization: Not on file    Attends meetings of clubs or organizations: Not on file    Relationship status: Not on file  Other Topics Concern  . Not on file  Social History Narrative  . Not on file    Patient Active Problem List   Diagnosis Date Noted  . Atherosclerosis of lower extremity with claudication ([place]) 11/18/2016  . Complication associated with vascular device 11/13/2016  . Pain in limb 08/14/2016  . Hyperlipidemia 06/18/2016  . Hypertension 06/18/2016  . Radiculopathy of lumbar region 06/10/2016  . PVD  (peripheral vascular disease) ([place]) 05/08/2016  . 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 Prediabetes 08/25/2014    Past Surgical History:  Procedure Laterality Date  . BACK SURGERY  2011  . CENTRAL LINE INSERTION Right 09/10/2016   Procedure: CENTRAL LINE INSERTION;  Surgeon: Katha Cabal, MD;  Location: [facility];  Service: Vascular;  Laterality: Right;  . COLONOSCOPY  2013 ?  . FEMORAL-POPLITEAL BYPASS GRAFT Right 09/10/2016   Procedure: BYPASS GRAFT FEMORAL-POPLITEAL ARTERY ( BELOW KNEE );  Surgeon: Katha Cabal, MD;  Location: [facility];  Service: Vascular;  Laterality: Right;  . LOWER EXTREMITY ANGIOGRAPHY Right 11/18/2016   Procedure: Lower Extremity Angiography;  Surgeon: Katha Cabal, MD;  Location: [place] [facility];  Service: Cardiovascular;  Laterality: Right;  . LOWER EXTREMITY ANGIOGRAPHY Right 12/09/2016   Procedure: Lower Extremity Angiography;  Surgeon: Katha Cabal, MD;  Location: [place] [facility];  Service: Cardiovascular;  Laterality: Right;  . LOWER EXTREMITY INTERVENTION  11/18/2016   Procedure: Lower Extremity Intervention;  Surgeon: Katha Cabal, MD;  Location: [place] [facility];  Service: Cardiovascular;;  . PERIPHERAL VASCULAR CATHETERIZATION Right 01/02/2015   Procedure: Lower Extremity Angiography;  Surgeon: Katha Cabal, MD;  Location: [place] [facility];  Service: Cardiovascular;  Laterality: Right;  . PERIPHERAL VASCULAR CATHETERIZATION Right 06/18/2015   Procedure: Lower Extremity Angiography;  Surgeon: Algernon Huxley, MD;  Location: [place] [facility];  Service: Cardiovascular;  Laterality: Right;  . PERIPHERAL VASCULAR CATHETERIZATION  06/18/2015   Procedure: Lower Extremity Intervention;  Surgeon: Algernon Huxley, MD;  Location: [place] [facility];  Service: Cardiovascular;;  . PERIPHERAL VASCULAR CATHETERIZATION N/A 10/09/2015    Procedure: Abdominal Aortogram w/Lower Extremity;  Surgeon: Katha Cabal, MD;  Location: [place] [facility];  Service: Cardiovascular;  Laterality: N/A;  . PERIPHERAL VASCULAR CATHETERIZATION  10/09/2015   Procedure: Lower Extremity Intervention;  Surgeon: Katha Cabal, MD;  Location: [place] [facility];  Service: Cardiovascular;;  . PERIPHERAL VASCULAR CATHETERIZATION Right 10/10/2015   Procedure: Lower Extremity Angiography;  Surgeon: Algernon Huxley, MD;  Location: [place] [facility];  Service: Cardiovascular;  Laterality: Right;  . PERIPHERAL VASCULAR CATHETERIZATION Right 10/10/2015   Procedure: Lower Extremity Intervention;  Surgeon: Algernon Huxley, MD;  Location: [place] [facility];  Service: Cardiovascular;  Laterality: Right;  . PERIPHERAL VASCULAR CATHETERIZATION Right 12/03/2015   Procedure: Lower Extremity Angiography;  Surgeon: Algernon Huxley, MD;  Location: [place] [facility];  Service: Cardiovascular;  Laterality: Right;  . PERIPHERAL VASCULAR CATHETERIZATION Right 12/04/2015   Procedure: Lower Extremity Angiography;  Surgeon: Algernon Huxley, MD;  Location: [place] [facility];  Service: Cardiovascular;  Laterality: Right;  . PERIPHERAL VASCULAR CATHETERIZATION  12/04/2015   Procedure: Lower Extremity Intervention;  Surgeon: Corene Cornea  Bunnie Domino, MD;  Location: [place] [facility];  Service: Cardiovascular;;  . PERIPHERAL VASCULAR CATHETERIZATION Right 06/30/2016   Procedure: Lower Extremity Angiography;  Surgeon: Algernon Huxley, MD;  Location: [place] [facility];  Service: Cardiovascular;  Laterality: Right;  . PERIPHERAL VASCULAR CATHETERIZATION Right 06/25/2016   Procedure: Lower Extremity Angiography;  Surgeon: Katha Cabal, MD;  Location: [place] [facility];  Service: Cardiovascular;  Laterality: Right;  . PERIPHERAL VASCULAR CATHETERIZATION Right 07/01/2016   Procedure: Lower Extremity Angiography;  Surgeon: Katha Cabal, MD;  Location: [place] [facility];  Service:  Cardiovascular;  Laterality: Right;  . PERIPHERAL VASCULAR CATHETERIZATION  07/01/2016   Procedure: Lower Extremity Intervention;  Surgeon: Katha Cabal, MD;  Location: [place] [facility];  Service: Cardiovascular;;  . PERIPHERAL VASCULAR CATHETERIZATION Right 08/01/2016   Procedure: Lower Extremity Angiography;  Surgeon: Katha Cabal, MD;  Location: [place] [facility];  Service: Cardiovascular;  Laterality: Right;  . stent placement in right leg Right     Family History  Family Status  Relation Name Status  . Mother  Alive  . Father  Deceased  . Neg Hx  (Not Specified)   His family history includes Diabetes in his mother; Heart murmur in his mother; Hypertension in his mother.     No Known Allergies  Previous Medications   ACETAMINOPHEN (TYLENOL) 650 MG CR TABLET    Take 650 mg by mouth daily as needed for pain.   ASPIRIN EC 81 MG TABLET    Take 81 mg by mouth daily.   CLOPIDOGREL (PLAVIX) 75 MG TABLET    Take 1 tablet (75 mg total) by mouth daily.   LISINOPRIL (ZESTRIL) 20 MG TABLET    Take 20 mg by mouth daily.   SIMVASTATIN (ZOCOR) 20 MG TABLET    Take 20 mg by mouth daily.    Patient Care Team: Virginia Crews, MD as PCP - General (Family Medicine) [name], MD as Referring Physician (Physician Assistant) Robert Bellow, MD (General Surgery)      Objective:   Vitals: BP (!) 153/82   Pulse 82   Temp (!) 96.8 F (36 C) (Oral)   Resp 16   Ht 5' 11.5" (1.816 m)   Wt 213 lb (96.6 kg)   BMI 29.29 kg/m    Physical Exam Vitals signs reviewed.  Constitutional:      General: He is not in acute distress.    Appearance: Normal appearance. He is well-developed. He is not diaphoretic.  HENT:     Head: Normocephalic and atraumatic.     Right Ear: Tympanic membrane, ear canal and external ear normal.     Left Ear: Tympanic membrane, ear canal and external ear normal.  Eyes:     General: No scleral icterus.    Conjunctiva/sclera:  Conjunctivae normal.     Pupils: Pupils are equal, round, and reactive to light.  Neck:     Musculoskeletal: Neck supple.     Thyroid: No thyromegaly.  Cardiovascular:     Rate and Rhythm: Normal rate and regular rhythm.     Pulses: Normal pulses.     Heart sounds: Normal heart sounds. No murmur.  Pulmonary:     Effort: Pulmonary effort is normal. No respiratory distress.     Breath sounds: Normal breath sounds. No wheezing or rales.  Abdominal:     General: There is no distension.     Palpations: Abdomen is soft.  Tenderness: There is no abdominal tenderness.  Musculoskeletal:        General: No deformity.     Right lower leg: No edema.     Left lower leg: No edema.     Comments: Back: No midline tenderness to palpation.  Negative straight leg raise bilaterally.  Antalgic gait.  Strength and sensation in lower extremities intact  Lymphadenopathy:     Cervical: No cervical adenopathy.  Skin:    General: Skin is warm and dry.     Capillary Refill: Capillary refill takes less than 2 seconds.     Findings: No rash.  Neurological:     Mental Status: He is alert and oriented to person, place, and time. Mental status is at baseline.  Psychiatric:        Mood and Affect: Mood normal.        Behavior: Behavior normal.        Thought Content: Thought content normal.      Depression Screen PHQ 2/9 Scores 04/13/2019  PHQ - 2 Score 0  PHQ- 9 Score 6    Assessment & Plan:     Establish Care  Exercise Activities and Dietary recommendations Goals   None     Immunization History  Administered Date(s) Administered  . Influenza,inj,Quad PF,6+ Mos 09/14/2016    Health Maintenance  Topic Date Due  . Hepatitis C Screening  11-27-1955  . HIV Screening  06/13/1973  . TETANUS/TDAP  06/13/1977  . COLONOSCOPY  06/13/2008  . INFLUENZA VACCINE  02/19/2019     Discussed health benefits of physical activity, and encouraged him to engage in regular exercise appropriate for his  age and condition.    --------------------------------------------------------------------  Problem List Items Addressed This Visit      Cardiovascular and Mediastinum   PVD (peripheral vascular disease) ([place])    Followed by vascular surgery Status post femoropopliteal bypass in right leg Continue dual antiplatelet therapy Discussed importance of good blood pressure control and statin therapy      Relevant Medications   simvastatin (ZOCOR) 20 MG tablet   lisinopril (ZESTRIL) 10 MG tablet   Other Relevant Orders   Lipid panel   Hypertension    Uncontrolled today and recently at vascular surgery office Discussed importance of blood pressure control in reducing the progression of atherosclerotic disease Resume lisinopril at 10 mg daily Recheck metabolic panel Follow-up in 1 month and consider dose titration      Relevant Medications   simvastatin (ZOCOR) 20 MG tablet   lisinopril (ZESTRIL) 10 MG tablet   Other Relevant Orders   Comprehensive metabolic panel     Other   Hyperlipidemia    Discussed importance of lipid control in reducing the progression of atherosclerotic disease Resume statin therapy Check fasting lipid panel and CMP Goal LDL less than 70      Relevant Medications   simvastatin (ZOCOR) 20 MG tablet   lisinopril (ZESTRIL) 10 MG tablet   Other Relevant Orders   Lipid panel   Comprehensive metabolic panel   Prediabetes - Primary    Discussed low-carb diet Recheck A1c      Relevant Orders   Hemoglobin A1c   Chronic left-sided low back pain without sciatica    Worsening back pain over the last 6 months with history of back surgery Check lumbar x-ray      Relevant Orders   DG Lumbar Spine Complete   History of back surgery    History of spinal surgery now with back pain  Check lumbar spine x-ray to ensure hardware is intact No radicular symptoms or red flag symptoms      Relevant Orders   DG Lumbar Spine Complete   Overweight    Discussed  importance of healthy weight management Discussed diet and exercise       Other Visit Diagnoses    Need for influenza vaccination       Relevant Orders   Flu Vaccine QUAD 36+ mos IM (Completed)   Screening for HIV (human immunodeficiency virus)       Relevant Orders   HIV antibody (with reflex)   Need for hepatitis C screening test       Relevant Orders   Hepatitis C Antibody   Long-term use of high-risk medication       Relevant Orders   CBC w/Diff/Platelet       Return in about 4 weeks (around 05/11/2019) for BP f/u.   The entirety of the information documented in the History of Present Illness, Review of Systems and Physical Exam were personally obtained by me. Portions of this information were initially documented by Hebert Soho, CMA and reviewed by me for thoroughness and accuracy.    , Dionne Bucy, MD MPH [facility]

## 2019-04-14 LAB — HEPATITIS C ANTIBODY: Hep C Virus Ab: 0.5 s/co ratio (ref 0.0–0.9)

## 2019-04-14 LAB — HEMOGLOBIN A1C
Est. average glucose Bld gHb Est-mCnc: 117 mg/dL
Hgb A1c MFr Bld: 5.7 % — ABNORMAL HIGH (ref 4.8–5.6)

## 2019-04-14 LAB — COMPREHENSIVE METABOLIC PANEL
ALT: 21 IU/L (ref 0–44)
AST: 17 IU/L (ref 0–40)
Albumin/Globulin Ratio: 1.3 (ref 1.2–2.2)
Albumin: 4.1 g/dL (ref 3.8–4.9)
Alkaline Phosphatase: 117 IU/L (ref 39–117)
BUN/Creatinine Ratio: 13 (ref 10–24)
BUN: 15 mg/dL (ref 8–27)
Bilirubin Total: <0.2 mg/dL (ref 0.0–1.2)
CO2: 26 mmol/L (ref 20–29)
Calcium: 9.8 mg/dL (ref 8.6–10.2)
Chloride: 101 mmol/L (ref 96–106)
Creatinine, Ser: 1.15 mg/dL (ref 0.76–1.27)
GFR calc Af Amer: 80 mL/min/{1.73_m2} (ref >59)
GFR calc non Af Amer: 69 mL/min/{1.73_m2} (ref >59)
Globulin, Total: 3.2 g/dL (ref 1.5–4.5)
Glucose: 123 mg/dL — ABNORMAL HIGH (ref 65–99)
Potassium: 4.4 mmol/L (ref 3.5–5.2)
Sodium: 141 mmol/L (ref 134–144)
Total Protein: 7.3 g/dL (ref 6.0–8.5)

## 2019-04-14 LAB — LIPID PANEL
Chol/HDL Ratio: 3.3 ratio (ref 0.0–5.0)
Cholesterol, Total: 153 mg/dL (ref 100–199)
HDL: 46 mg/dL (ref >39)
LDL Chol Calc (NIH): 94 mg/dL (ref 0–99)
Triglycerides: 67 mg/dL (ref 0–149)
VLDL Cholesterol Cal: 13 mg/dL (ref 5–40)

## 2019-04-14 LAB — HIV ANTIBODY (ROUTINE TESTING W REFLEX): HIV Screen 4th Generation wRfx: NONREACTIVE

## 2019-04-14 LAB — CBC WITH DIFFERENTIAL/PLATELET
Basophils Absolute: 0.1 10*3/uL (ref 0.0–0.2)
Basos: 1 %
EOS (ABSOLUTE): 0.2 10*3/uL (ref 0.0–0.4)
Eos: 2 %
Hematocrit: 39.8 % (ref 37.5–51.0)
Hemoglobin: 13.3 g/dL (ref 13.0–17.7)
Immature Grans (Abs): 0 10*3/uL (ref 0.0–0.1)
Immature Granulocytes: 0 %
Lymphocytes Absolute: 1.3 10*3/uL (ref 0.7–3.1)
Lymphs: 20 %
MCH: 27.2 pg (ref 26.6–33.0)
MCHC: 33.4 g/dL (ref 31.5–35.7)
MCV: 81 fL (ref 79–97)
Monocytes Absolute: 0.7 10*3/uL (ref 0.1–0.9)
Monocytes: 10 %
Neutrophils Absolute: 4.5 10*3/uL (ref 1.4–7.0)
Neutrophils: 67 %
Platelets: 346 10*3/uL (ref 150–450)
RBC: 4.89 x10E6/uL (ref 4.14–5.80)
RDW: 12.6 % (ref 11.6–15.4)
WBC: 6.7 10*3/uL (ref 3.4–10.8)

## 2019-05-07 ENCOUNTER — Other Ambulatory Visit: Payer: Self-pay | Admitting: Family Medicine

## 2019-05-10 ENCOUNTER — Other Ambulatory Visit: Payer: Self-pay | Admitting: Family Medicine

## 2019-05-13 ENCOUNTER — Ambulatory Visit: Payer: Self-pay | Admitting: Family Medicine

## 2019-05-16 ENCOUNTER — Other Ambulatory Visit: Payer: Self-pay

## 2019-05-16 ENCOUNTER — Ambulatory Visit (INDEPENDENT_AMBULATORY_CARE_PROVIDER_SITE_OTHER): Payer: Medicare Other | Admitting: Family Medicine

## 2019-05-16 ENCOUNTER — Encounter: Payer: Self-pay | Admitting: Family Medicine

## 2019-05-16 VITALS — BP 136/75 | HR 77 | Temp 97.3°F | Resp 18 | Ht 71.0 in | Wt 210.0 lb

## 2019-05-16 DIAGNOSIS — I10 Essential (primary) hypertension: Secondary | ICD-10-CM

## 2019-05-16 MED ORDER — SIMVASTATIN 20 MG PO TABS: 20.0000 mg | ORAL_TABLET | Freq: Every day | ORAL | 1 refills | Status: DC

## 2019-05-16 MED ORDER — LISINOPRIL 10 MG PO TABS: 10.0000 mg | ORAL_TABLET | Freq: Every day | ORAL | 1 refills | Status: DC

## 2019-05-16 NOTE — Assessment & Plan Note (Signed)
Well controlled Continue current medications Recheck metabolic panel as recently started Lisinopril F/u in 6 months

## 2019-05-16 NOTE — Patient Instructions (Signed)

## 2019-05-16 NOTE — Progress Notes (Signed)
Subjective:    Patient ID: Joshua [name], male    DOB: 06-19-1956, 63 y.o.   MRN: [id number]  Chief Complaint  Patient presents with  . Hypertension    Hypertension This is a chronic problem. The problem is controlled (with medication. Has been running 120's/70's at home.). There are no compliance problems.    Patient is in today for 1 Month Follow up for HTN.  HTN: - Medications: lisinopril 10mg  daily - resumed 1 month ago - Compliance: good - Checking BP at home: yes, 110s-120/70s - Denies any SOB, CP, vision changes, LE edema, medication SEs, or symptoms of hypotension - Diet: Low-sodium - Exercise: None regularly  Past Medical History:  Diagnosis Date  . Allergy   . Dyspnea    former smoker. only doe  . Hyperlipidemia   . Hypertension   . PAD (peripheral artery disease) ([place])   . Peripheral vascular disease ([place])   . Pre-diabetes     Past Surgical History:  Procedure Laterality Date  . BACK SURGERY  2011  . CENTRAL LINE INSERTION Right 09/10/2016   Procedure: CENTRAL LINE INSERTION;  Surgeon: Katha Cabal, MD;  Location: [facility];  Service: Vascular;  Laterality: Right;  . COLONOSCOPY  2013 ?  . FEMORAL-POPLITEAL BYPASS GRAFT Right 09/10/2016   Procedure: BYPASS GRAFT FEMORAL-POPLITEAL ARTERY ( BELOW KNEE );  Surgeon: Katha Cabal, MD;  Location: [facility];  Service: Vascular;  Laterality: Right;  . LOWER EXTREMITY ANGIOGRAPHY Right 11/18/2016   Procedure: Lower Extremity Angiography;  Surgeon: Katha Cabal, MD;  Location: [place] [facility];  Service: Cardiovascular;  Laterality: Right;  . LOWER EXTREMITY ANGIOGRAPHY Right 12/09/2016   Procedure: Lower Extremity Angiography;  Surgeon: Katha Cabal, MD;  Location: [place] [facility];  Service: Cardiovascular;  Laterality: Right;  . LOWER EXTREMITY INTERVENTION  11/18/2016   Procedure: Lower Extremity Intervention;  Surgeon: Katha Cabal, MD;  Location: [place] [facility];   Service: Cardiovascular;;  . PERIPHERAL VASCULAR CATHETERIZATION Right 01/02/2015   Procedure: Lower Extremity Angiography;  Surgeon: Katha Cabal, MD;  Location: [place] [facility];  Service: Cardiovascular;  Laterality: Right;  . PERIPHERAL VASCULAR CATHETERIZATION Right 06/18/2015   Procedure: Lower Extremity Angiography;  Surgeon: Algernon Huxley, MD;  Location: [place] [facility];  Service: Cardiovascular;  Laterality: Right;  . PERIPHERAL VASCULAR CATHETERIZATION  06/18/2015   Procedure: Lower Extremity Intervention;  Surgeon: Algernon Huxley, MD;  Location: [place] [facility];  Service: Cardiovascular;;  . PERIPHERAL VASCULAR CATHETERIZATION N/A 10/09/2015   Procedure: Abdominal Aortogram w/Lower Extremity;  Surgeon: Katha Cabal, MD;  Location: [place] [facility];  Service: Cardiovascular;  Laterality: N/A;  . PERIPHERAL VASCULAR CATHETERIZATION  10/09/2015   Procedure: Lower Extremity Intervention;  Surgeon: Katha Cabal, MD;  Location: [place] [facility];  Service: Cardiovascular;;  . PERIPHERAL VASCULAR CATHETERIZATION Right 10/10/2015   Procedure: Lower Extremity Angiography;  Surgeon: Algernon Huxley, MD;  Location: [place] [facility];  Service: Cardiovascular;  Laterality: Right;  . PERIPHERAL VASCULAR CATHETERIZATION Right 10/10/2015   Procedure: Lower Extremity Intervention;  Surgeon: Algernon Huxley, MD;  Location: [place] [facility];  Service: Cardiovascular;  Laterality: Right;  . PERIPHERAL VASCULAR CATHETERIZATION Right 12/03/2015   Procedure: Lower Extremity Angiography;  Surgeon: Algernon Huxley, MD;  Location: [place] [facility];  Service: Cardiovascular;  Laterality: Right;  . PERIPHERAL VASCULAR CATHETERIZATION Right 12/04/2015   Procedure: Lower Extremity Angiography;  Surgeon: Corene Cornea  Bunnie Domino, MD;  Location: [place] [facility];  Service: Cardiovascular;  Laterality: Right;  . PERIPHERAL VASCULAR CATHETERIZATION  12/04/2015   Procedure: Lower Extremity Intervention;   Surgeon: Algernon Huxley, MD;  Location: [place] [facility];  Service: Cardiovascular;;  . PERIPHERAL VASCULAR CATHETERIZATION Right 06/30/2016   Procedure: Lower Extremity Angiography;  Surgeon: Algernon Huxley, MD;  Location: [place] [facility];  Service: Cardiovascular;  Laterality: Right;  . PERIPHERAL VASCULAR CATHETERIZATION Right 06/25/2016   Procedure: Lower Extremity Angiography;  Surgeon: Katha Cabal, MD;  Location: [place] [facility];  Service: Cardiovascular;  Laterality: Right;  . PERIPHERAL VASCULAR CATHETERIZATION Right 07/01/2016   Procedure: Lower Extremity Angiography;  Surgeon: Katha Cabal, MD;  Location: [place] [facility];  Service: Cardiovascular;  Laterality: Right;  . PERIPHERAL VASCULAR CATHETERIZATION  07/01/2016   Procedure: Lower Extremity Intervention;  Surgeon: Katha Cabal, MD;  Location: [place] [facility];  Service: Cardiovascular;;  . PERIPHERAL VASCULAR CATHETERIZATION Right 08/01/2016   Procedure: Lower Extremity Angiography;  Surgeon: Katha Cabal, MD;  Location: [place] [facility];  Service: Cardiovascular;  Laterality: Right;  . stent placement in right leg Right     Family History  Problem Relation Age of Onset  . Diabetes Mother   . Hypertension Mother   . Heart murmur Mother   . Colon cancer Neg Hx   . Breast cancer Neg Hx     Social History   Socioeconomic History  . Marital status: Single    Spouse name: Not on file  . Number of children: Not on file  . Years of education: Not on file  . Highest education level: Not on file  Occupational History  . Occupation: unemployed  Social Needs  . Financial resource strain: Not on file  . Food insecurity    Worry: Not on file    Inability: Not on file  . Transportation needs    Medical: Not on file    Non-medical: Not on file  Tobacco Use  . Smoking status: Former Smoker    Packs/day: 1.50    Years: 38.00    Pack years: 57.00    Types: Cigarettes    Quit date:  12/20/2014    Years since quitting: 4.4  . Smokeless tobacco: Former Network engineer and Sexual Activity  . Alcohol use: Not Currently  . Drug use: No  . Sexual activity: Yes  Lifestyle  . Physical activity    Days per week: Not on file    Minutes per session: Not on file  . Stress: Not on file  Relationships  . Social Herbalist on phone: Not on file    Gets together: Not on file    Attends religious service: Not on file    Active member of club or organization: Not on file    Attends meetings of clubs or organizations: Not on file    Relationship status: Not on file  . Intimate partner violence    Fear of current or ex partner: Not on file    Emotionally abused: Not on file    Physically abused: Not on file    Forced sexual activity: Not on file  Other Topics Concern  . Not on file  Social History Narrative  . Not on file    Outpatient Medications Prior to Visit  Medication Sig Dispense Refill  . acetaminophen (TYLENOL) 650 MG CR tablet Take 650 mg by mouth daily as needed  for pain.    Marland Kitchen aspirin EC 81 MG tablet Take 81 mg by mouth daily.    . clopidogrel (PLAVIX) 75 MG tablet Take 1 tablet (75 mg total) by mouth daily. 30 tablet 6  . lisinopril (ZESTRIL) 10 MG tablet TAKE 1 TABLET BY MOUTH ONCE DAILY 30 tablet 1  . simvastatin (ZOCOR) 20 MG tablet TAKE 1 TABLET BY MOUTH ONCE EVERY EVENING 30 tablet 1   No facility-administered medications prior to visit.     No Known Allergies  Review of Systems  Constitutional: Negative.   Respiratory: Negative.   Cardiovascular: Negative.   Gastrointestinal: Negative.   Psychiatric/Behavioral: Negative.        Objective:    Physical Exam  Constitutional: He is oriented to person, place, and time. He appears well-developed and well-nourished. No distress.  HENT:  Head: Normocephalic and atraumatic.  Eyes: Pupils are equal, round, and reactive to light. Conjunctivae are normal. No scleral icterus.  Neck: Neck  supple.  Cardiovascular: Normal rate and regular rhythm.  No murmur heard. Pulmonary/Chest: Effort normal and breath sounds normal. No respiratory distress. He has no wheezes.  Musculoskeletal:        General: No edema.  Lymphadenopathy:    He has no cervical adenopathy.  Neurological: He is alert and oriented to person, place, and time.  Skin: Skin is warm and dry. No rash noted.  Psychiatric: He has a normal mood and affect. His behavior is normal.  Vitals reviewed.   BP 136/75   Pulse 77   Temp (!) 97.3 F (36.3 C) (Temporal)   Resp 18   Ht 5\' 11"  (1.803 m)   Wt 210 lb (95.3 kg)   SpO2 99%   BMI 29.29 kg/m  Wt Readings from Last 3 Encounters:  05/16/19 210 lb (95.3 kg)  04/13/19 213 lb (96.6 kg)  04/07/19 211 lb (95.7 kg)        Assessment & Plan:   Problem List Items Addressed This Visit      Cardiovascular and Mediastinum   Hypertension - Primary    Well controlled Continue current medications Recheck metabolic panel as recently started Lisinopril F/u in 6 months       Relevant Medications   lisinopril (ZESTRIL) 10 MG tablet   simvastatin (ZOCOR) 20 MG tablet   Other Relevant Orders   Renal Function Panel       Return in about 5 months (around 10/14/2019) for [place].   The entirety of the information documented in the History of Present Illness, Review of Systems and Physical Exam were personally obtained by me. Portions of this information were initially documented by [facility], CMA and reviewed by me for thoroughness and accuracy.    , Dionne Bucy, MD MPH [facility]

## 2019-05-17 ENCOUNTER — Telehealth: Payer: Self-pay

## 2019-05-17 LAB — RENAL FUNCTION PANEL
Albumin: 4.2 g/dL (ref 3.8–4.9)
BUN/Creatinine Ratio: 15 (ref 10–24)
BUN: 17 mg/dL (ref 8–27)
CO2: 21 mmol/L (ref 20–29)
Calcium: 9.7 mg/dL (ref 8.6–10.2)
Chloride: 100 mmol/L (ref 96–106)
Creatinine, Ser: 1.11 mg/dL (ref 0.76–1.27)
GFR calc Af Amer: 83 mL/min/{1.73_m2} (ref >59)
GFR calc non Af Amer: 72 mL/min/{1.73_m2} (ref >59)
Glucose: 107 mg/dL — ABNORMAL HIGH (ref 65–99)
Phosphorus: 2.6 mg/dL — ABNORMAL LOW (ref 2.8–4.1)
Potassium: 4.3 mmol/L (ref 3.5–5.2)
Sodium: 137 mmol/L (ref 134–144)

## 2019-05-17 NOTE — Telephone Encounter (Signed)
-----   Message from Virginia Crews, MD sent at 05/17/2019  8:33 AM EDT ----- Normal labs. Phosphorus is incidentally found to be slightly low.  Phosphorus is found in protein containing foods, so can increase those in diet.

## 2019-05-17 NOTE — Telephone Encounter (Signed)
Pt advised.   Thanks,   -Shatha Hooser  

## 2019-07-01 ENCOUNTER — Other Ambulatory Visit (INDEPENDENT_AMBULATORY_CARE_PROVIDER_SITE_OTHER): Payer: Self-pay | Admitting: Nurse Practitioner

## 2019-07-01 DIAGNOSIS — I70219 Atherosclerosis of native arteries of extremities with intermittent claudication, unspecified extremity: Secondary | ICD-10-CM

## 2019-09-27 ENCOUNTER — Other Ambulatory Visit: Payer: Self-pay | Admitting: Family Medicine

## 2019-09-27 MED ORDER — LISINOPRIL 10 MG PO TABS: 10.0000 mg | ORAL_TABLET | Freq: Every day | ORAL | 1 refills | Status: DC

## 2019-09-27 MED ORDER — SIMVASTATIN 20 MG PO TABS: 20.0000 mg | ORAL_TABLET | Freq: Every day | ORAL | 1 refills | Status: DC

## 2019-10-06 ENCOUNTER — Other Ambulatory Visit: Payer: Self-pay

## 2019-10-06 ENCOUNTER — Encounter (INDEPENDENT_AMBULATORY_CARE_PROVIDER_SITE_OTHER): Payer: Self-pay | Admitting: Vascular Surgery

## 2019-10-06 ENCOUNTER — Ambulatory Visit (INDEPENDENT_AMBULATORY_CARE_PROVIDER_SITE_OTHER): Payer: Medicare Other | Admitting: Vascular Surgery

## 2019-10-06 ENCOUNTER — Ambulatory Visit (INDEPENDENT_AMBULATORY_CARE_PROVIDER_SITE_OTHER): Payer: Medicare Other

## 2019-10-06 VITALS — BP 111/67 | HR 67 | Resp 16 | Wt 205.8 lb

## 2019-10-06 DIAGNOSIS — I10 Essential (primary) hypertension: Secondary | ICD-10-CM | POA: Diagnosis not present

## 2019-10-06 DIAGNOSIS — E782 Mixed hyperlipidemia: Secondary | ICD-10-CM | POA: Diagnosis not present

## 2019-10-06 DIAGNOSIS — I70219 Atherosclerosis of native arteries of extremities with intermittent claudication, unspecified extremity: Secondary | ICD-10-CM

## 2019-10-12 ENCOUNTER — Encounter (INDEPENDENT_AMBULATORY_CARE_PROVIDER_SITE_OTHER): Payer: Self-pay | Admitting: Vascular Surgery

## 2019-10-12 NOTE — Progress Notes (Signed)
MRN : [id number]  Joshua [name] is a 64 y.o. (Nov 24, 1955) male who presents with chief complaint of  Chief Complaint  Patient presents with  . Follow-up    ultrasound follow up  .  History of Present Illness:   The patient returns to the office for followup and review of the noninvasive studies. There have been no interval changes in lower extremity symptoms. No interval shortening of the patient's claudication distance or development of rest pain symptoms. No new ulcers or wounds have occurred since the last visit.  There have been no significant changes to the patient's overall health care.  The patient denies amaurosis fugax or recent TIA symptoms. There are no recent neurological changes noted. The patient denies history of DVT, PE or superficial thrombophlebitis. The patient denies recent episodes of angina or shortness of breath.   ABI Rt=1.07and Lt=1.24(previous ABI Rt=0.93and Lt=1.12) Duplex ultrasound of the right leg shows thebypass remains patent with a stable mild mid graft stenosis  Current Meds  Medication Sig  . acetaminophen (TYLENOL) 650 MG CR tablet Take 650 mg by mouth daily as needed for pain.  Marland Kitchen aspirin EC 81 MG tablet Take 81 mg by mouth daily.  . clopidogrel (PLAVIX) 75 MG tablet TAKE 1 TABLET BY MOUTH  DAILY  . lisinopril (ZESTRIL) 10 MG tablet Take 1 tablet (10 mg total) by mouth daily.  . simvastatin (ZOCOR) 20 MG tablet Take 1 tablet (20 mg total) by mouth daily at 6 PM.    Past Medical History:  Diagnosis Date  . Allergy   . Dyspnea    former smoker. only doe  . Hyperlipidemia   . Hypertension   . PAD (peripheral artery disease) ([place])   . Peripheral vascular disease ([place])   . Pre-diabetes     Past Surgical History:  Procedure Laterality Date  . BACK SURGERY  2011  . CENTRAL LINE INSERTION Right 09/10/2016   Procedure: CENTRAL LINE INSERTION;  Surgeon: Katha Cabal, MD;  Location: [facility];  Service: Vascular;   Laterality: Right;  . COLONOSCOPY  2013 ?  . FEMORAL-POPLITEAL BYPASS GRAFT Right 09/10/2016   Procedure: BYPASS GRAFT FEMORAL-POPLITEAL ARTERY ( BELOW KNEE );  Surgeon: Katha Cabal, MD;  Location: [facility];  Service: Vascular;  Laterality: Right;  . LOWER EXTREMITY ANGIOGRAPHY Right 11/18/2016   Procedure: Lower Extremity Angiography;  Surgeon: Katha Cabal, MD;  Location: [place] [facility];  Service: Cardiovascular;  Laterality: Right;  . LOWER EXTREMITY ANGIOGRAPHY Right 12/09/2016   Procedure: Lower Extremity Angiography;  Surgeon: Katha Cabal, MD;  Location: [place] [facility];  Service: Cardiovascular;  Laterality: Right;  . LOWER EXTREMITY INTERVENTION  11/18/2016   Procedure: Lower Extremity Intervention;  Surgeon: Katha Cabal, MD;  Location: [place] [facility];  Service: Cardiovascular;;  . PERIPHERAL VASCULAR CATHETERIZATION Right 01/02/2015   Procedure: Lower Extremity Angiography;  Surgeon: Katha Cabal, MD;  Location: [place] [facility];  Service: Cardiovascular;  Laterality: Right;  . PERIPHERAL VASCULAR CATHETERIZATION Right 06/18/2015   Procedure: Lower Extremity Angiography;  Surgeon: Algernon Huxley, MD;  Location: [place] [facility];  Service: Cardiovascular;  Laterality: Right;  . PERIPHERAL VASCULAR CATHETERIZATION  06/18/2015   Procedure: Lower Extremity Intervention;  Surgeon: Algernon Huxley, MD;  Location: [place] [facility];  Service: Cardiovascular;;  . PERIPHERAL VASCULAR CATHETERIZATION N/A 10/09/2015   Procedure: Abdominal Aortogram w/Lower Extremity;  Surgeon: Katha Cabal, MD;  Location: [place] [facility];  Service: Cardiovascular;  Laterality: N/A;  . PERIPHERAL VASCULAR CATHETERIZATION  10/09/2015   Procedure: Lower Extremity Intervention;  Surgeon: Katha Cabal, MD;  Location: [place] [facility];  Service: Cardiovascular;;  . PERIPHERAL VASCULAR CATHETERIZATION Right 10/10/2015   Procedure: Lower Extremity Angiography;   Surgeon: Algernon Huxley, MD;  Location: [place] [facility];  Service: Cardiovascular;  Laterality: Right;  . PERIPHERAL VASCULAR CATHETERIZATION Right 10/10/2015   Procedure: Lower Extremity Intervention;  Surgeon: Algernon Huxley, MD;  Location: [place] [facility];  Service: Cardiovascular;  Laterality: Right;  . PERIPHERAL VASCULAR CATHETERIZATION Right 12/03/2015   Procedure: Lower Extremity Angiography;  Surgeon: Algernon Huxley, MD;  Location: [place] [facility];  Service: Cardiovascular;  Laterality: Right;  . PERIPHERAL VASCULAR CATHETERIZATION Right 12/04/2015   Procedure: Lower Extremity Angiography;  Surgeon: Algernon Huxley, MD;  Location: [place] [facility];  Service: Cardiovascular;  Laterality: Right;  . PERIPHERAL VASCULAR CATHETERIZATION  12/04/2015   Procedure: Lower Extremity Intervention;  Surgeon: Algernon Huxley, MD;  Location: [place] [facility];  Service: Cardiovascular;;  . PERIPHERAL VASCULAR CATHETERIZATION Right 06/30/2016   Procedure: Lower Extremity Angiography;  Surgeon: Algernon Huxley, MD;  Location: [place] [facility];  Service: Cardiovascular;  Laterality: Right;  . PERIPHERAL VASCULAR CATHETERIZATION Right 06/25/2016   Procedure: Lower Extremity Angiography;  Surgeon: Katha Cabal, MD;  Location: [place] [facility];  Service: Cardiovascular;  Laterality: Right;  . PERIPHERAL VASCULAR CATHETERIZATION Right 07/01/2016   Procedure: Lower Extremity Angiography;  Surgeon: Katha Cabal, MD;  Location: [place] [facility];  Service: Cardiovascular;  Laterality: Right;  . PERIPHERAL VASCULAR CATHETERIZATION  07/01/2016   Procedure: Lower Extremity Intervention;  Surgeon: Katha Cabal, MD;  Location: [place] [facility];  Service: Cardiovascular;;  . PERIPHERAL VASCULAR CATHETERIZATION Right 08/01/2016   Procedure: Lower Extremity Angiography;  Surgeon: Katha Cabal, MD;  Location: [place] [facility];  Service: Cardiovascular;  Laterality: Right;  . stent  placement in right leg Right     Social History Social History   Tobacco Use  . Smoking status: Former Smoker    Packs/day: 1.50    Years: 38.00    Pack years: 57.00    Types: Cigarettes    Quit date: 12/20/2014    Years since quitting: 4.8  . Smokeless tobacco: Former Network engineer Use Topics  . Alcohol use: Not Currently  . Drug use: No    Family History Family History  Problem Relation Age of Onset  . Diabetes Mother   . Hypertension Mother   . Heart murmur Mother   . Colon cancer Neg Hx   . Breast cancer Neg Hx     No Known Allergies   REVIEW OF SYSTEMS (Negative unless checked)  Constitutional: [] Weight loss  [] Fever  [] Chills Cardiac: [] Chest pain   [] Chest pressure   [] Palpitations   [] Shortness of breath when laying flat   [] Shortness of breath with exertion. Vascular:  [x] Pain in legs with walking   [] Pain in legs at rest  [] History of DVT   [] Phlebitis   [] Swelling in legs   [] Varicose veins   [] Non-healing ulcers Pulmonary:   [] Uses home oxygen   [] Productive cough   [] Hemoptysis   [] Wheeze  [] COPD   [] Asthma Neurologic:  [] Dizziness   [] Seizures   [] History of stroke   [] History of TIA  [] Aphasia   [] Vissual changes   [] Weakness or numbness in arm   [x] Weakness or numbness in  leg Musculoskeletal:   [] Joint swelling   [x] Joint pain   [] Low back pain Hematologic:  [] Easy bruising  [] Easy bleeding   [] Hypercoagulable state   [] Anemic Gastrointestinal:  [] Diarrhea   [] Vomiting  [] Gastroesophageal reflux/heartburn   [] Difficulty swallowing. Genitourinary:  [] Chronic kidney disease   [] Difficult urination  [] Frequent urination   [] Blood in urine Skin:  [] Rashes   [] Ulcers  Psychological:  [] History of anxiety   []  History of major depression.  Physical Examination  Vitals:   10/06/19 1442  BP: 111/67  Pulse: 67  Resp: 16  Weight: 205 lb 12.8 oz (93.4 kg)   Body mass index is 28.7 kg/m. Gen: WD/WN, NAD Head: [place]/AT, No temporalis wasting.    Ear/Nose/Throat: Hearing grossly intact, nares w/o erythema or drainage Eyes: PER, EOMI, sclera nonicteric.  Neck: Supple, no large masses.   Pulmonary:  Good air movement, no audible wheezing bilaterally, no use of accessory muscles.  Cardiac: RRR, no JVD Vascular:  Vessel Right Left  Radial Palpable Palpable  PT Trace Palpable Palpable  DP Trace Palpable Palpable  Gastrointestinal: Non-distended. No guarding/no peritoneal signs.  Musculoskeletal: M/S 5/5 throughout.  No deformity or atrophy.  Neurologic: CN 2-12 intact. Symmetrical.  Speech is fluent. Motor exam as listed above. Psychiatric: Judgment intact, Mood & affect appropriate for pt's clinical situation. Dermatologic: No rashes or ulcers noted.  No changes consistent with cellulitis.  CBC Lab Results  Component Value Date   WBC 6.7 04/13/2019   HGB 13.3 04/13/2019   HCT 39.8 04/13/2019   MCV 81 04/13/2019   PLT 346 04/13/2019    BMET    Component Value Date/Time   NA 137 05/16/2019 1159   K 4.3 05/16/2019 1159   CL 100 05/16/2019 1159   CO2 21 05/16/2019 1159   GLUCOSE 107 (H) 05/16/2019 1159   GLUCOSE 102 (H) 11/19/2016 0513   BUN 17 05/16/2019 1159   BUN 16 07/03/2014 1205   CREATININE 1.11 05/16/2019 1159   CREATININE 1.34 (H) 07/03/2014 1205   CALCIUM 9.7 05/16/2019 1159   GFRNONAA 72 05/16/2019 1159   GFRNONAA 59 (L) 07/03/2014 1205   GFRAA 83 05/16/2019 1159   GFRAA >60 07/03/2014 1205   CrCl cannot be calculated (Patient's most recent lab result is older than the maximum 21 days allowed.).  COAG Lab Results  Component Value Date   INR 1.11 06/25/2016   INR 1.90 05/12/2016   INR 1.20 12/05/2015    Radiology VAS [place] ABI WITH/WO TBI  Result Date: 10/10/2019 LOWER EXTREMITY DOPPLER STUDY Indications: Routine f/u. Other Factors: ASO s/p multiple interventions and bypass.  Vascular Interventions: Hx of multiple right lower extremity PTA's/stents.                         09/10/2016 Right femoral to  below the knee popliteal                         artery bypass graft placement. 5/01 Right bypass graft &                         mid popliteal artery PTA & coil embolectomy of large                         branch of bypass graft. 12/09/16 Right distal bypass  graft PTA/stent and right posterior tibial artery PTA. Comparison Study: 04/07/2019 Performing Technologist: Charlane Ferretti RT (R)(VS)  Examination Guidelines: A complete evaluation includes at minimum, Doppler waveform signals and systolic blood pressure reading at the level of bilateral brachial, anterior tibial, and posterior tibial arteries, when vessel segments are accessible. Bilateral testing is considered an integral part of a complete examination. Photoelectric Plethysmograph (PPG) waveforms and toe systolic pressure readings are included as required and additional duplex testing as needed. Limited examinations for reoccurring indications may be performed as noted.  ABI Findings: +---------+------------------+-----+---------+--------+ Right    Rt Pressure (mmHg)IndexWaveform Comment  +---------+------------------+-----+---------+--------+ Brachial 120                                      +---------+------------------+-----+---------+--------+ ATA      92                0.75 biphasic          +---------+------------------+-----+---------+--------+ PTA      131               1.07 triphasic         +---------+------------------+-----+---------+--------+ Great Toe67                0.54 Abnormal          +---------+------------------+-----+---------+--------+ +---------+------------------+-----+---------+-------+ Left     Lt Pressure (mmHg)IndexWaveform Comment +---------+------------------+-----+---------+-------+ Brachial 123                                     +---------+------------------+-----+---------+-------+ ATA      133               1.08 triphasic         +---------+------------------+-----+---------+-------+ PTA      152               1.24 triphasic        +---------+------------------+-----+---------+-------+ Great Toe101               0.82 Normal           +---------+------------------+-----+---------+-------+ +-------+-----------+-----------+------------+------------+ ABI/TBIToday's ABIToday's TBIPrevious ABIPrevious TBI +-------+-----------+-----------+------------+------------+ Right  1.07       .54        .93         .70          +-------+-----------+-----------+------------+------------+ Left   1.24       .82        1.12        1.12         +-------+-----------+-----------+------------+------------+ Bilateral ABIs appear essentially unchanged compared to prior study on 04/07/2019. Summary: Right: Resting right ankle-brachial index is within normal range. No evidence of significant right lower extremity arterial disease. The right toe-brachial index is abnormal. Right TBU appears decreased from the previous exam on 04/07/2019. Left: Resting left ankle-brachial index is within normal range. No evidence of significant left lower extremity arterial disease. The left toe-brachial index is normal. Left TBI appears essentially unchanged from the previous exam on 04/07/2019.  *See table(s) above for measurements and observations.  Electronically signed by [name] MD on 10/10/2019 at 11:54:43 AM.    Final    VAS [place] LOWER EXTREMITY ARTERIAL DUPLEX  Result Date: 10/10/2019 LOWER EXTREMITY ARTERIAL DUPLEX STUDY  Current ABI: Right = 1/07 Left = 1.24 Comparison Study: 04/15/2018 Performing Technologist: Lowella Bandy  Bobbye Charleston RT (R)(VS)  Examination Guidelines: A complete evaluation includes B-mode imaging, spectral Doppler, color Doppler, and power Doppler as needed of all accessible portions of each vessel. Bilateral testing is considered an integral part of a complete examination. Limited examinations for reoccurring indications may be  performed as noted.  +----------+--------+-----+--------+---------+--------+ RIGHT     PSV cm/sRatioStenosisWaveform Comments +----------+--------+-----+--------+---------+--------+ CFA Distal55                   triphasic         +----------+--------+-----+--------+---------+--------+ POP Prox  204                  triphasic         +----------+--------+-----+--------+---------+--------+ POP Distal42                   triphasic         +----------+--------+-----+--------+---------+--------+ ATA Distal14                   triphasic         +----------+--------+-----+--------+---------+--------+ PTA Distal50                   triphasic         +----------+--------+-----+--------+---------+--------+  Right Graft #1: +------------------+--------+--------+--------+--------------+                   PSV cm/sStenosisWaveformComments       +------------------+--------+--------+--------+--------------+ Inflow            34                                     +------------------+--------+--------+--------+--------------+ Prox Anastomosis  55                                     +------------------+--------+--------+--------+--------------+ Proximal Graft    175                                    +------------------+--------+--------+--------+--------------+ Mid Graft         128                                    +------------------+--------+--------+--------+--------------+ Distal Graft      67                                     +------------------+--------+--------+--------+--------------+ Distal Anastomosis149                                    +------------------+--------+--------+--------+--------------+ Outflow                                   Not visualized +------------------+--------+--------+--------+--------------+  Summary: Right: Patent stent with no evidence of stenosis in the Right patent stent with no evidence of  restenosis in the distal fem/pop graft artery. No significant change as compared to the previous exam on 04/15/2018. Patent right fem/pop BPG with mild mid graft elevated velocities as on previous exam. Popliteal artery demonstrates  retrograde flow as on previous exam.  See table(s) above for measurements and observations. Electronically signed by [name] MD on 10/10/2019 at 11:54:46 AM.    Final      Assessment/Plan 1. Atherosclerosis of lower extremity with claudication ([place])  Recommend:  The patient has evidence of atherosclerosis of the lower extremities with claudication.  The patient does not voice lifestyle limiting changes at this point in time.  Noninvasive studies do not suggest clinically significant change.  No invasive studies, angiography or surgery at this time The patient should continue walking and begin a more formal exercise program.  The patient should continue antiplatelet therapy and aggressive treatment of the lipid abnormalities  No changes in the patient's medications at this time  - VAS [place] LOWER EXTREMITY ARTERIAL DUPLEX; Future - VAS [place] ABI WITH/WO TBI; Future  2. Essential hypertension Continue antihypertensive medications as already ordered, these medications have been reviewed and there are no changes at this time.   3. Mixed hyperlipidemia Continue statin as ordered and reviewed, no changes at this time     [name], MD  10/12/2019 10:31 AM

## 2019-10-14 ENCOUNTER — Other Ambulatory Visit: Payer: Self-pay | Admitting: Family Medicine

## 2019-10-14 ENCOUNTER — Ambulatory Visit (INDEPENDENT_AMBULATORY_CARE_PROVIDER_SITE_OTHER): Payer: Medicare Other | Admitting: Family Medicine

## 2019-10-14 ENCOUNTER — Encounter: Payer: Self-pay | Admitting: Family Medicine

## 2019-10-14 ENCOUNTER — Other Ambulatory Visit: Payer: Self-pay

## 2019-10-14 VITALS — BP 132/74 | HR 73 | Temp 96.9°F | Resp 16 | Ht 72.0 in | Wt 208.0 lb

## 2019-10-14 DIAGNOSIS — R7303 Prediabetes: Secondary | ICD-10-CM

## 2019-10-14 DIAGNOSIS — F4321 Adjustment disorder with depressed mood: Secondary | ICD-10-CM | POA: Insufficient documentation

## 2019-10-14 DIAGNOSIS — I10 Essential (primary) hypertension: Secondary | ICD-10-CM

## 2019-10-14 DIAGNOSIS — G629 Polyneuropathy, unspecified: Secondary | ICD-10-CM | POA: Insufficient documentation

## 2019-10-14 DIAGNOSIS — Z0000 Encounter for general adult medical examination without abnormal findings: Secondary | ICD-10-CM

## 2019-10-14 DIAGNOSIS — G588 Other specified mononeuropathies: Secondary | ICD-10-CM

## 2019-10-14 DIAGNOSIS — E663 Overweight: Secondary | ICD-10-CM | POA: Diagnosis not present

## 2019-10-14 DIAGNOSIS — I743 Embolism and thrombosis of arteries of the lower extremities: Secondary | ICD-10-CM | POA: Diagnosis not present

## 2019-10-14 DIAGNOSIS — E782 Mixed hyperlipidemia: Secondary | ICD-10-CM | POA: Diagnosis not present

## 2019-10-14 DIAGNOSIS — I739 Peripheral vascular disease, unspecified: Secondary | ICD-10-CM | POA: Diagnosis not present

## 2019-10-14 MED ORDER — GABAPENTIN 300 MG PO CAPS: 300.0000 mg | ORAL_CAPSULE | Freq: Every day | ORAL | 3 refills | Status: DC

## 2019-10-14 NOTE — Assessment & Plan Note (Signed)
Well controlled Continue current medications Recheck metabolic panel F/u in 6 months  

## 2019-10-14 NOTE — Progress Notes (Signed)
Patient: Joshua [name], Male    DOB: 1956-01-01, 65 y.o.   MRN: [id number] Visit Date: 10/14/2019  Today's Provider: Lavon Paganini, MD   Chief Complaint  Patient presents with  . Annual Exam   Subjective:     Annual wellness visit Joshua [name] is a 64 y.o. male. He feels well. He reports exercising no. He reports he is sleeping fairly well.  -----------------------------------------------------------   Review of Systems  Constitutional: Negative.   HENT: Negative.   Eyes: Negative.   Respiratory: Positive for shortness of breath.   Cardiovascular: Negative.   Gastrointestinal: Negative.   Endocrine: Negative.   Genitourinary: Positive for difficulty urinating.  Musculoskeletal: Positive for back pain.  Skin: Negative.   Allergic/Immunologic: Positive for environmental allergies.  Neurological: Positive for numbness.  Hematological: Bruises/bleeds easily.  Psychiatric/Behavioral: Negative.     Social History   Socioeconomic History  . Marital status: Single    Spouse name: Not on file  . Number of children: Not on file  . Years of education: Not on file  . Highest education level: Not on file  Occupational History  . Occupation: unemployed  Tobacco Use  . Smoking status: Former Smoker    Packs/day: 1.50    Years: 38.00    Pack years: 57.00    Types: Cigarettes    Quit date: 12/20/2014    Years since quitting: 4.8  . Smokeless tobacco: Former Network engineer and Sexual Activity  . Alcohol use: Not Currently  . Drug use: No  . Sexual activity: Yes  Other Topics Concern  . Not on file  Social History Narrative  . Not on file   Social Determinants of Health   Financial Resource Strain:   . Difficulty of Paying Living Expenses:   Food Insecurity:   . Worried About Charity fundraiser in the Last Year:   . Arboriculturist in the Last Year:   Transportation Needs:   . Film/video editor (Medical):   Marland Kitchen Lack of Transportation  (Non-Medical):   Physical Activity:   . Days of Exercise per Week:   . Minutes of Exercise per Session:   Stress:   . Feeling of Stress :   Social Connections:   . Frequency of Communication with Friends and Family:   . Frequency of Social Gatherings with Friends and Family:   . Attends Religious Services:   . Active Member of Clubs or Organizations:   . Attends Archivist Meetings:   Marland Kitchen Marital Status:   Intimate Partner Violence:   . Fear of Current or Ex-Partner:   . Emotionally Abused:   Marland Kitchen Physically Abused:   . Sexually Abused:     Past Medical History:  Diagnosis Date  . Allergy   . Dyspnea    former smoker. only doe  . Hyperlipidemia   . Hypertension   . PAD (peripheral artery disease) ([place])   . Peripheral vascular disease ([place])   . Pre-diabetes      Patient Active Problem List   Diagnosis Date Noted  . Chronic left-sided low back pain without sciatica 04/13/2019  . History of back surgery 04/13/2019  . Overweight 04/13/2019  . Atherosclerosis of lower extremity with claudication ([place]) 11/18/2016  . Complication associated with vascular device 11/13/2016  . Pain in limb 08/14/2016  . Hyperlipidemia 06/18/2016  . Hypertension 06/18/2016  . Radiculopathy of lumbar region 06/10/2016  . PVD (peripheral vascular disease) ([place]) 05/08/2016  . 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 Prediabetes 08/25/2014    Past Surgical History:  Procedure Laterality Date  . BACK SURGERY  2011  . CENTRAL LINE INSERTION Right 09/10/2016   Procedure: CENTRAL LINE INSERTION;  Surgeon: Katha Cabal, MD;  Location: [facility];  Service: Vascular;  Laterality: Right;  . COLONOSCOPY  2013 ?  . FEMORAL-POPLITEAL BYPASS GRAFT Right 09/10/2016   Procedure: BYPASS GRAFT FEMORAL-POPLITEAL ARTERY ( BELOW KNEE );  Surgeon: Katha Cabal, MD;  Location: [facility];  Service:  Vascular;  Laterality: Right;  . LOWER EXTREMITY ANGIOGRAPHY Right 11/18/2016   Procedure: Lower Extremity Angiography;  Surgeon: Katha Cabal, MD;  Location: [place] [facility];  Service: Cardiovascular;  Laterality: Right;  . LOWER EXTREMITY ANGIOGRAPHY Right 12/09/2016   Procedure: Lower Extremity Angiography;  Surgeon: Katha Cabal, MD;  Location: [facility] [facility];  Service: Cardiovascular;  Laterality: Right;  . LOWER EXTREMITY INTERVENTION  11/18/2016   Procedure: Lower Extremity Intervention;  Surgeon: Katha Cabal, MD;  Location: [place] [facility];  Service: Cardiovascular;;  . PERIPHERAL VASCULAR CATHETERIZATION Right 01/02/2015   Procedure: Lower Extremity Angiography;  Surgeon: Katha Cabal, MD;  Location: [place] [facility];  Service: Cardiovascular;  Laterality: Right;  . PERIPHERAL VASCULAR CATHETERIZATION Right 06/18/2015   Procedure: Lower Extremity Angiography;  Surgeon: Algernon Huxley, MD;  Location: [place] [facility];  Service: Cardiovascular;  Laterality: Right;  . PERIPHERAL VASCULAR CATHETERIZATION  06/18/2015   Procedure: Lower Extremity Intervention;  Surgeon: Algernon Huxley, MD;  Location: [place];  Service: Cardiovascular;;  . PERIPHERAL VASCULAR CATHETERIZATION N/A 10/09/2015   Procedure: Abdominal Aortogram w/Lower Extremity;  Surgeon: Katha Cabal, MD;  Location: [facility];  Service: Cardiovascular;  Laterality: N/A;  . PERIPHERAL VASCULAR CATHETERIZATION  10/09/2015   Procedure: Lower Extremity Intervention;  Surgeon: Katha Cabal, MD;  Location: [facility];  Service: Cardiovascular;;  . PERIPHERAL VASCULAR CATHETERIZATION Right 10/10/2015   Procedure: Lower Extremity Angiography;  Surgeon: Algernon Huxley, MD;  Location: [facility];  Service: Cardiovascular;  Laterality: Right;  . PERIPHERAL VASCULAR CATHETERIZATION Right 10/10/2015   Procedure: Lower Extremity Intervention;  Surgeon: Algernon Huxley,  MD;  Location: [facility] [facility];  Service: Cardiovascular;  Laterality: Right;  . PERIPHERAL VASCULAR CATHETERIZATION Right 12/03/2015   Procedure: Lower Extremity Angiography;  Surgeon: Algernon Huxley, MD;  Location: [place] [facility];  Service: Cardiovascular;  Laterality: Right;  . PERIPHERAL VASCULAR CATHETERIZATION Right 12/04/2015   Procedure: Lower Extremity Angiography;  Surgeon: Algernon Huxley, MD;  Location: [facility];  Service: Cardiovascular;  Laterality: Right;  . PERIPHERAL VASCULAR CATHETERIZATION  12/04/2015   Procedure: Lower Extremity Intervention;  Surgeon: Algernon Huxley, MD;  Location: [place];  Service: Cardiovascular;;  . PERIPHERAL VASCULAR CATHETERIZATION Right 06/30/2016   Procedure: Lower Extremity Angiography;  Surgeon: Algernon Huxley, MD;  Location: [place] [facility];  Service: Cardiovascular;  Laterality: Right;  . PERIPHERAL VASCULAR CATHETERIZATION Right 06/25/2016   Procedure: Lower Extremity Angiography;  Surgeon: Katha Cabal, MD;  Location: [facility];  Service: Cardiovascular;  Laterality: Right;  . PERIPHERAL VASCULAR CATHETERIZATION Right 07/01/2016   Procedure: Lower Extremity Angiography;  Surgeon: Katha Cabal, MD;  Location: [place] [facility];  Service: Cardiovascular;  Laterality: Right;  . PERIPHERAL VASCULAR CATHETERIZATION  07/01/2016   Procedure: Lower Extremity Intervention;  Surgeon: Katha Cabal, MD;  Location: [facility] [facility];  Service: Cardiovascular;;  . PERIPHERAL VASCULAR CATHETERIZATION Right 08/01/2016   Procedure: Lower Extremity Angiography;  Surgeon: Katha Cabal, MD;  Location: [facility] [facility];  Service: Cardiovascular;  Laterality: Right;  . stent placement in right leg Right     His family history includes Diabetes in his mother; Heart murmur in his mother; Hypertension in his mother. There is no history of Colon cancer or Breast cancer.   Current Outpatient Medications:    .  acetaminophen (TYLENOL) 650 MG CR tablet, Take 650 mg by mouth daily as needed for pain., Disp: , Rfl:  .  aspirin EC 81 MG tablet, Take 81 mg by mouth daily., Disp: , Rfl:  .  clopidogrel (PLAVIX) 75 MG tablet, TAKE 1 TABLET BY MOUTH  DAILY, Disp: 90 tablet, Rfl: 3 .  lisinopril (ZESTRIL) 10 MG tablet, Take 1 tablet (10 mg total) by mouth daily., Disp: 90 tablet, Rfl: 1 .  simvastatin (ZOCOR) 20 MG tablet, Take 1 tablet (20 mg total) by mouth daily at 6 PM., Disp: 90 tablet, Rfl: 1  Patient Care Team: Virginia Crews, MD as PCP - General (Family Medicine) [name], MD as Referring Physician (Physician Assistant) Robert Bellow, MD (General Surgery)    Objective:    Vitals: BP 132/74 (BP Location: Left Arm, Patient Position: Sitting, Cuff Size: Large)   Pulse 73   Temp (!) 96.9 F (36.1 C) (Temporal)   Resp 16   Ht 6' (1.829 m)   Wt 208 lb (94.3 kg)   BMI 28.21 kg/m   Physical Exam  Activities of Daily Living In your present state of health, do you have any difficulty performing the following activities: 10/14/2019 04/13/2019  Hearing? N N  Vision? N N  Difficulty concentrating or making decisions? Tempie Donning  Walking or climbing stairs? Y Y  Dressing or bathing? N N  Doing errands, shopping? N N  Some recent data might be hidden    Fall Risk Assessment Fall Risk  10/14/2019  Falls in the past year? 1  Number falls in past yr: 0  Injury with Fall? 0  Risk for fall due to : No Fall Risks  Follow up Falls evaluation completed     Depression Screen PHQ 2/9 Scores 10/14/2019 04/13/2019  PHQ - 2 Score 2 0  PHQ- 9 Score 9 6    No flowsheet data found.    Assessment & Plan:     Annual Wellness Visit  Reviewed patient's Family Medical History Reviewed and updated list of patient's medical providers Assessment of cognitive impairment was done Assessed patient's functional ability Established a written schedule for health screening [place] Completed and Reviewed  Exercise Activities and Dietary recommendations Goals   None     Immunization History  Administered Date(s) Administered  . Influenza,inj,Quad PF,6+ Mos 09/14/2016, 04/13/2019    Health Maintenance  Topic Date Due  . TETANUS/TDAP  Never done  . COLONOSCOPY  Never done  . INFLUENZA VACCINE  Completed  . Hepatitis C Screening  Completed  . HIV Screening  Completed     Discussed health benefits of physical activity, and encouraged him to engage in regular exercise appropriate for his age and condition.     Planning for COVID 19 vaccination soon after discussion of safety and efficacy Plan for TDAP and shingrix at next visit - will  not interfere with COVID 19 vaccination ------------------------------------------------------------------------------------------------------------  Problem List Items Addressed This Visit      Cardiovascular and Mediastinum   PVD (peripheral vascular disease) ([place])    Follow-up vascular surgery Status post femoropopliteal bypass and right leg No ongoing claudication symptoms Continue dual antiplatelet therapy Discussed importance of good blood pressure control and statin therapy      Hypertension    Well controlled Continue current medications Recheck metabolic panel F/u in 6 months       Relevant Orders   Basic Metabolic Panel (BMET)     Nervous and Auditory   Peripheral neuropathy    Recurrent problem Ongoing tingling in right foot Has seen neurology previously May be related to his surgeries for his PAD Trial of gabapentin 300 mg nightly Discussed possible side effects Discussed return precautions Follow-up in 6 months or sooner as needed      Relevant Medications   gabapentin (NEURONTIN) 300 MG capsule   Other Relevant Orders   B12     Other   Hyperlipidemia    Discussed importance of lipid control and reducing the progression of atherosclerotic disease Continue statin Goal  LDL less than 70 Repeat FLP and CMP at next visit      Prediabetes    Encourage low carb diet Recheck A1c      Relevant Orders   Hemoglobin A1c   Overweight    Discussed importance of healthy weight management Discussed diet and exercise       Grief    Recent death of his mother in 30-Sep-2019 He denies any depression, and feels as though his PHQ-9 score was elevated just due to normal grieving He feels overall he is coping well Discussed return precautions       Other Visit Diagnoses    Encounter for annual wellness visit (AWV) in Medicare patient    -  Primary   Encounter for annual physical exam       Relevant Orders   Basic Metabolic Panel (BMET)   Hemoglobin A1c       Return in about 6 months (around 04/15/2020) for chronic disease f/u.   The entirety of the information documented in the History of Present Illness, Review of Systems and Physical Exam were personally obtained by me. Portions of this information were initially documented by Lynford Humphrey, CMA and reviewed by me for thoroughness and accuracy.    Eddy Liszewski, Dionne Bucy, MD MPH [facility]

## 2019-10-14 NOTE — Assessment & Plan Note (Signed)
Discussed importance of healthy weight management Discussed diet and exercise  

## 2019-10-14 NOTE — Patient Instructions (Addendum)
The CDC recommends two doses of Shingrix (the shingles vaccine) separated by 2 to 6 months for adults age 62 years and older. I recommend checking with your insurance plan regarding coverage for this vaccine.   Will do tetanus shot at next visit  Preventive Care 15-13 Years Old, Male Preventive care refers to lifestyle choices and visits with your health care provider that can promote health and wellness. This includes:  A yearly physical exam. This is also called an annual well check.  Regular dental and eye exams.  Immunizations.  Screening for certain conditions.  Healthy lifestyle choices, such as eating a healthy diet, getting regular exercise, not using drugs or products that contain nicotine and tobacco, and limiting alcohol use. What can I expect for my preventive care visit? Physical exam Your health care provider will check:  Height and weight. These may be used to calculate body mass index (BMI), which is a measurement that tells if you are at a healthy weight.  Heart rate and blood pressure.  Your skin for abnormal spots. Counseling Your health care provider may ask you questions about:  Alcohol, tobacco, and drug use.  Emotional well-being.  Home and relationship well-being.  Sexual activity.  Eating habits.  Work and work Statistician. What immunizations do I need?  Influenza (flu) vaccine  This is recommended every year. Tetanus, diphtheria, and pertussis (Tdap) vaccine  You may need a Td booster every 10 years. Varicella (chickenpox) vaccine  You may need this vaccine if you have not already been vaccinated. Zoster (shingles) vaccine  You may need this after age 23. Measles, mumps, and rubella (MMR) vaccine  You may need at least one dose of MMR if you were born in 1957 or later. You may also need a second dose. Pneumococcal conjugate (PCV13) vaccine  You may need this if you have certain conditions and were not previously  vaccinated. Pneumococcal polysaccharide (PPSV23) vaccine  You may need one or two doses if you smoke cigarettes or if you have certain conditions. Meningococcal conjugate (MenACWY) vaccine  You may need this if you have certain conditions. Hepatitis A vaccine  You may need this if you have certain conditions or if you travel or work in places where you may be exposed to hepatitis A. Hepatitis B vaccine  You may need this if you have certain conditions or if you travel or work in places where you may be exposed to hepatitis B. Haemophilus influenzae type b (Hib) vaccine  You may need this if you have certain risk factors. Human papillomavirus (HPV) vaccine  If recommended by your health care provider, you may need three doses over 6 months. You may receive vaccines as individual doses or as more than one vaccine together in one shot (combination vaccines). Talk with your health care provider about the risks and benefits of combination vaccines. What tests do I need? Blood tests  Lipid and cholesterol levels. These may be checked every 5 years, or more frequently if you are over 22 years old.  Hepatitis C test.  Hepatitis B test. Screening  Lung cancer screening. You may have this screening every year starting at age 39 if you have a 30-pack-year history of smoking and currently smoke or have quit within the past 15 years.  Prostate cancer screening. Recommendations will vary depending on your family history and other risks.  Colorectal cancer screening. All adults should have this screening starting at age 27 and continuing until age 33. Your health care provider  may recommend screening at age 48 if you are at increased risk. You will have tests every 1-10 years, depending on your results and the type of screening test.  Diabetes screening. This is done by checking your blood sugar (glucose) after you have not eaten for a while (fasting). You may have this done every 1-3  years.  Sexually transmitted disease (STD) testing. Follow these instructions at home: Eating and drinking  Eat a diet that includes fresh fruits and vegetables, whole grains, lean protein, and low-fat dairy products.  Take vitamin and mineral supplements as recommended by your health care provider.  Do not drink alcohol if your health care provider tells you not to drink.  If you drink alcohol: ? Limit how much you have to 0-2 drinks a day. ? Be aware of how much alcohol is in your drink. In the [place] one drink equals one 12 oz bottle of beer (355 mL), one 5 oz glass of wine (148 mL), or one 1 oz glass of hard liquor (44 mL). Lifestyle  Take daily care of your teeth and gums.  Stay active. Exercise for at least 30 minutes on 5 or more days each week.  Do not use any products that contain nicotine or tobacco, such as cigarettes, e-cigarettes, and chewing tobacco. If you need help quitting, ask your health care provider.  If you are sexually active, practice safe sex. Use a condom or other form of protection to prevent STIs (sexually transmitted infections).  Talk with your health care provider about taking a low-dose aspirin every day starting at age 32. What's next?  Go to your health care provider once a year for a well check visit.  Ask your health care provider how often you should have your eyes and teeth checked.  Stay up to date on all vaccines. This information is not intended to replace advice given to you by your health care provider. Make sure you discuss any questions you have with your health care provider. Document Revised: 07/01/2018 Document Reviewed: 07/01/2018 Elsevier Patient Education  2020 [employer].

## 2019-10-14 NOTE — Assessment & Plan Note (Signed)
Encourage low carb diet Recheck A1c

## 2019-10-14 NOTE — Assessment & Plan Note (Signed)
Recent death of his mother in 09/20/19 He denies any depression, and feels as though his PHQ-9 score was elevated just due to normal grieving He feels overall he is coping well Discussed return precautions

## 2019-10-14 NOTE — Assessment & Plan Note (Signed)
Recurrent problem Ongoing tingling in right foot Has seen neurology previously May be related to his surgeries for his PAD Trial of gabapentin 300 mg nightly Discussed possible side effects Discussed return precautions Follow-up in 6 months or sooner as needed

## 2019-10-14 NOTE — Assessment & Plan Note (Signed)
Follow-up vascular surgery Status post femoropopliteal bypass and right leg No ongoing claudication symptoms Continue dual antiplatelet therapy Discussed importance of good blood pressure control and statin therapy

## 2019-10-14 NOTE — Assessment & Plan Note (Signed)
Discussed importance of lipid control and reducing the progression of atherosclerotic disease Continue statin Goal LDL less than 70 Repeat FLP and CMP at next visit

## 2019-10-15 LAB — BASIC METABOLIC PANEL
BUN/Creatinine Ratio: 13 (ref 10–24)
BUN: 15 mg/dL (ref 8–27)
CO2: 24 mmol/L (ref 20–29)
Calcium: 9.4 mg/dL (ref 8.6–10.2)
Chloride: 103 mmol/L (ref 96–106)
Creatinine, Ser: 1.12 mg/dL (ref 0.76–1.27)
GFR calc Af Amer: 82 mL/min/{1.73_m2} (ref >59)
GFR calc non Af Amer: 71 mL/min/{1.73_m2} (ref >59)
Glucose: 87 mg/dL (ref 65–99)
Potassium: 4.3 mmol/L (ref 3.5–5.2)
Sodium: 141 mmol/L (ref 134–144)

## 2019-10-15 LAB — HEMOGLOBIN A1C
Est. average glucose Bld gHb Est-mCnc: 117 mg/dL
Hgb A1c MFr Bld: 5.7 % — ABNORMAL HIGH (ref 4.8–5.6)

## 2019-10-15 LAB — VITAMIN B12: Vitamin B-12: 370 pg/mL (ref 232–1245)

## 2019-10-17 ENCOUNTER — Telehealth: Payer: Self-pay | Admitting: *Deleted

## 2019-10-17 ENCOUNTER — Telehealth: Payer: Self-pay

## 2019-10-17 NOTE — Telephone Encounter (Signed)
Reviewed lab results and physician's note with the patient. No further questions.

## 2019-10-17 NOTE — Telephone Encounter (Signed)
Results given in 2nd telephone encounter on 10/17/19.

## 2019-10-17 NOTE — Telephone Encounter (Signed)
LMTCB 10/17/2019.  PEC please advise pt of lab results.      Thanks,   -Mickel Baas

## 2019-10-17 NOTE — Telephone Encounter (Signed)
-----   Message from Virginia Crews, MD sent at 10/17/2019  8:46 AM EDT ----- Normal/stable labs

## 2019-10-18 ENCOUNTER — Encounter: Payer: Self-pay | Admitting: Family Medicine

## 2019-10-20 ENCOUNTER — Other Ambulatory Visit: Payer: Self-pay

## 2019-10-20 ENCOUNTER — Ambulatory Visit: Payer: Medicare Other | Attending: Internal Medicine

## 2019-10-20 DIAGNOSIS — Z23 Encounter for immunization: Secondary | ICD-10-CM

## 2019-10-20 NOTE — Progress Notes (Signed)
   Covid-19 Vaccination Clinic  Name:  [name]    MRN: [id number] DOB: Nov 20, 1955  10/20/2019  [name] was observed post Covid-19 immunization for 15 minutes without incident. He was provided with Vaccine Information Sheet and instruction to access the V-Safe system.   [name] was instructed to call 911 with any severe reactions post vaccine: Marland Kitchen Difficulty breathing  . Swelling of face and throat  . A fast heartbeat  . A bad rash all over body  . Dizziness and weakness   Immunizations Administered    Name Date Dose VIS Date Route   Pfizer COVID-19 Vaccine 10/20/2019 10:41 AM 0.3 mL 07/01/2019 Intramuscular   Manufacturer: [place]   Lot: [id number]   [place]: [id number]

## 2019-10-31 ENCOUNTER — Ambulatory Visit (INDEPENDENT_AMBULATORY_CARE_PROVIDER_SITE_OTHER): Payer: Medicare Other | Admitting: Family Medicine

## 2019-10-31 ENCOUNTER — Ambulatory Visit: Payer: Self-pay | Admitting: *Deleted

## 2019-10-31 ENCOUNTER — Telehealth (INDEPENDENT_AMBULATORY_CARE_PROVIDER_SITE_OTHER): Payer: Self-pay | Admitting: Vascular Surgery

## 2019-10-31 ENCOUNTER — Encounter: Payer: Self-pay | Admitting: Family Medicine

## 2019-10-31 ENCOUNTER — Other Ambulatory Visit: Payer: Self-pay

## 2019-10-31 VITALS — BP 123/71 | HR 86 | Temp 97.3°F | Wt 209.6 lb

## 2019-10-31 DIAGNOSIS — L03115 Cellulitis of right lower limb: Secondary | ICD-10-CM

## 2019-10-31 MED ORDER — AMOXICILLIN-POT CLAVULANATE 875-125 MG PO TABS: 1.0000 | ORAL_TABLET | Freq: Two times a day (BID) | ORAL | 0 refills | Status: DC

## 2019-10-31 NOTE — Telephone Encounter (Signed)
This is from pt's last note per Dr. Carroll Kinds. Please advise.     The patient has evidence of atherosclerosis of the lower extremities with claudication.  The patient does not voice lifestyle limiting changes at this point in time.   Noninvasive studies do not suggest clinically significant change.   No invasive studies, angiography or surgery at this time The patient should continue walking and begin a more formal exercise program.  The patient should continue antiplatelet therapy and aggressive treatment of the lipid abnormalities   No changes in the patient's medications at this time   - VAS [place] LOWER EXTREMITY ARTERIAL DUPLEX; Future - VAS [place] ABI WITH/WO TBI; Future   2. Essential hypertension Continue antihypertensive medications as already ordered, these medications have been reviewed and there are no changes at this time.

## 2019-10-31 NOTE — Telephone Encounter (Signed)
The patient can come in with ABIs

## 2019-10-31 NOTE — Telephone Encounter (Signed)
How soon do you want the pt to come in with  ABIs?

## 2019-10-31 NOTE — Telephone Encounter (Signed)
Called stating he is having a stinging sensation and cramps of the right leg since thurs/fri. Wanted to come in to be seen. Last seen 10/04/19.

## 2019-10-31 NOTE — Telephone Encounter (Signed)
Patient calls with pain/tightness in the right upper leg at the scar where he had surgery in 2018. Hurts with movement only.This began 3 days ago. Stated he received his 1st Covid vaccine 10/20/19. Experienced cramps in that calf on/off since Friday. No swelling/redness/warmth in the area. Reports mild rt ankle edema on Friday that resolved same day. Denies SOB/CP/rash/fever/weakness. History of DVT. Appointment made for 3:40p today with Dr.Fisher.   Reason for Disposition . History of prior "blood clot" in leg or lungs (i.e., deep vein thrombosis, pulmonary embolism)  Answer Assessment - Initial Assessment Questions 1. ONSET: "When did the pain start?"    April 9th. 2. LOCATION: "Where is the pain located?"      Right upper inner leg 3. PAIN: "How bad is the pain?"    (Scale 1-10; or mild, moderate, severe)   -  MILD (1-3): doesn't interfere with normal activities    -  MODERATE (4-7): interferes with normal activities (e.g., work or school) or awakens from sleep, limping    -  SEVERE (8-10): excruciating pain, unable to do any normal activities, unable to walk      Takes a while to get going on that leg. 4. WORK OR EXERCISE: "Has there been any recent work or exercise that involved this part of the body?"      no 5. CAUSE: "What do you think is causing the leg pain?"     Thinks maybe a blood clot. Takes plavix daily 6. OTHER SYMPTOMS: "Do you have any other symptoms?" (e.g., chest pain, back pain, breathing difficulty, swelling, rash, fever, numbness, weakness)      Only Rt ankle mild edema on Friday that resolved that day. 7. PREGNANCY: "Is there any chance you are pregnant?" "When was your last menstrual period?"     na  Protocols used: LEG PAIN-A-AH

## 2019-10-31 NOTE — Telephone Encounter (Signed)
Please schedule the pt per the NP's request.

## 2019-10-31 NOTE — Telephone Encounter (Signed)
Called patient to schedule appt. He wants to wait until he sees his PCP tomorrow and will go from there.

## 2019-10-31 NOTE — Progress Notes (Signed)
Established patient visit      Patient: Joshua [name]   DOB: 1956/05/28   64 y.o. Male  MRN: [id number] Visit Date: 10/31/2019  Today's healthcare provider: Lelon Huh, MD  Subjective:    Chief Complaint  Patient presents with  . Leg Pain   Leg Pain  Incident onset: Thursday. There was no injury mechanism. The pain is present in the right leg. The quality of the pain is described as burning, cramping and stabbing. The pain has been worsening since onset. He reports no foreign bodies present. He has tried nothing for the symptoms.  Patient has a PMH of PAD.  Pain is along old incision site in lower leg.   Past Medical History:  Diagnosis Date  . Allergy   . Dyspnea    former smoker. only doe  . Hyperlipidemia   . Hypertension   . PAD (peripheral artery disease) ([place])   . Peripheral vascular disease ([place])   . Pre-diabetes    Past Surgical History:  Procedure Laterality Date  . BACK SURGERY  2011  . CENTRAL LINE INSERTION Right 09/10/2016   Procedure: CENTRAL LINE INSERTION;  Surgeon: Katha Cabal, MD;  Location: [facility];  Service: Vascular;  Laterality: Right;  . COLONOSCOPY  2013 ?  . FEMORAL-POPLITEAL BYPASS GRAFT Right 09/10/2016   Procedure: BYPASS GRAFT FEMORAL-POPLITEAL ARTERY ( BELOW KNEE );  Surgeon: Katha Cabal, MD;  Location: [facility];  Service: Vascular;  Laterality: Right;  . LOWER EXTREMITY ANGIOGRAPHY Right 11/18/2016   Procedure: Lower Extremity Angiography;  Surgeon: Katha Cabal, MD;  Location: [place] [facility];  Service: Cardiovascular;  Laterality: Right;  . LOWER EXTREMITY ANGIOGRAPHY Right 12/09/2016   Procedure: Lower Extremity Angiography;  Surgeon: Katha Cabal, MD;  Location: [place] [facility];  Service: Cardiovascular;  Laterality: Right;  . LOWER EXTREMITY INTERVENTION  11/18/2016   Procedure: Lower Extremity Intervention;  Surgeon: Katha Cabal, MD;  Location: [place] [facility];  Service:  Cardiovascular;;  . PERIPHERAL VASCULAR CATHETERIZATION Right 01/02/2015   Procedure: Lower Extremity Angiography;  Surgeon: Katha Cabal, MD;  Location: [place] [facility];  Service: Cardiovascular;  Laterality: Right;  . PERIPHERAL VASCULAR CATHETERIZATION Right 06/18/2015   Procedure: Lower Extremity Angiography;  Surgeon: Algernon Huxley, MD;  Location: [place] [facility];  Service: Cardiovascular;  Laterality: Right;  . PERIPHERAL VASCULAR CATHETERIZATION  06/18/2015   Procedure: Lower Extremity Intervention;  Surgeon: Algernon Huxley, MD;  Location: [place] [facility];  Service: Cardiovascular;;  . PERIPHERAL VASCULAR CATHETERIZATION N/A 10/09/2015   Procedure: Abdominal Aortogram w/Lower Extremity;  Surgeon: Katha Cabal, MD;  Location: [place] [facility];  Service: Cardiovascular;  Laterality: N/A;  . PERIPHERAL VASCULAR CATHETERIZATION  10/09/2015   Procedure: Lower Extremity Intervention;  Surgeon: Katha Cabal, MD;  Location: [place] [facility];  Service: Cardiovascular;;  . PERIPHERAL VASCULAR CATHETERIZATION Right 10/10/2015   Procedure: Lower Extremity Angiography;  Surgeon: Algernon Huxley, MD;  Location: [place] [facility];  Service: Cardiovascular;  Laterality: Right;  . PERIPHERAL VASCULAR CATHETERIZATION Right 10/10/2015   Procedure: Lower Extremity Intervention;  Surgeon: Algernon Huxley, MD;  Location: [place] [facility];  Service: Cardiovascular;  Laterality: Right;  . PERIPHERAL VASCULAR CATHETERIZATION Right 12/03/2015   Procedure: Lower Extremity Angiography;  Surgeon: Algernon Huxley, MD;  Location: [place] [facility];  Service: Cardiovascular;  Laterality: Right;  . PERIPHERAL VASCULAR CATHETERIZATION Right 12/04/2015   Procedure: Lower Extremity  Angiography;  Surgeon: Algernon Huxley, MD;  Location: [place] [facility];  Service: Cardiovascular;  Laterality: Right;  . PERIPHERAL VASCULAR CATHETERIZATION  12/04/2015   Procedure: Lower Extremity Intervention;  Surgeon:  Algernon Huxley, MD;  Location: [place] [facility];  Service: Cardiovascular;;  . PERIPHERAL VASCULAR CATHETERIZATION Right 06/30/2016   Procedure: Lower Extremity Angiography;  Surgeon: Algernon Huxley, MD;  Location: [place] [facility];  Service: Cardiovascular;  Laterality: Right;  . PERIPHERAL VASCULAR CATHETERIZATION Right 06/25/2016   Procedure: Lower Extremity Angiography;  Surgeon: Katha Cabal, MD;  Location: [place] [facility];  Service: Cardiovascular;  Laterality: Right;  . PERIPHERAL VASCULAR CATHETERIZATION Right 07/01/2016   Procedure: Lower Extremity Angiography;  Surgeon: Katha Cabal, MD;  Location: [place] [facility];  Service: Cardiovascular;  Laterality: Right;  . PERIPHERAL VASCULAR CATHETERIZATION  07/01/2016   Procedure: Lower Extremity Intervention;  Surgeon: Katha Cabal, MD;  Location: [place] [facility];  Service: Cardiovascular;;  . PERIPHERAL VASCULAR CATHETERIZATION Right 08/01/2016   Procedure: Lower Extremity Angiography;  Surgeon: Katha Cabal, MD;  Location: [place] [facility];  Service: Cardiovascular;  Laterality: Right;  . stent placement in right leg Right        Medications: Outpatient Medications Prior to Visit  Medication Sig  . acetaminophen (TYLENOL) 650 MG CR tablet Take 650 mg by mouth daily as needed for pain.  Marland Kitchen aspirin EC 81 MG tablet Take 81 mg by mouth daily.  . clopidogrel (PLAVIX) 75 MG tablet TAKE 1 TABLET BY MOUTH  DAILY  . gabapentin (NEURONTIN) 300 MG capsule Take 1 capsule (300 mg total) by mouth at bedtime.  Marland Kitchen lisinopril (ZESTRIL) 10 MG tablet Take 1 tablet (10 mg total) by mouth daily.  . simvastatin (ZOCOR) 20 MG tablet Take 1 tablet (20 mg total) by mouth daily at 6 PM.   No facility-administered medications prior to visit.    Review of Systems  Constitutional: Negative.   Respiratory: Negative.   Cardiovascular: Negative.   Musculoskeletal:       Right leg pain        Objective:    BP 123/71  (BP Location: Right Arm, Patient Position: Sitting, Cuff Size: Normal)   Pulse 86   Temp (!) 97.3 F (36.3 C) (Temporal)   Wt 209 lb 9.6 oz (95.1 kg)   BMI 28.43 kg/m     Physical Exam  General appearance:  Overweight male, cooperative and in no acute distress Head: Normocephalic, without obvious abnormality, atraumatic Respiratory: Respirations even and unlabored, normal respiratory rate Extremities: Long bypass incision right inner upper leg very tender and slightly erythematous with some underlying induration.   No results found for any visits on 10/31/19.    Assessment & Plan:    1. Cellulitis of right lower extremity Along surgical incision site from prior femoral bypass surgery.  - amoxicillin-clavulanate (AUGMENTIN) 875-125 MG tablet; Take 1 tablet by mouth 2 (two) times daily for 7 days.  Dispense: 14 tablet; Refill: 0  Call if symptoms change or if not rapidly improving.  Otherwise will call patient in week to determine if additional antibiotic needed.      Lelon Huh, MD  [facility] [phone] (phone) [phone] (fax)  [place]

## 2019-10-31 NOTE — Telephone Encounter (Signed)
Sometime this week

## 2019-11-01 NOTE — Patient Instructions (Signed)
.   Please review the attached list of medications and notify my office if there are any errors.   . Please bring all of your medications to every appointment so we can make sure that our medication list is the same as yours.   

## 2019-11-06 ENCOUNTER — Telehealth: Payer: Self-pay | Admitting: Family Medicine

## 2019-11-06 DIAGNOSIS — L03115 Cellulitis of right lower limb: Secondary | ICD-10-CM

## 2019-11-06 NOTE — Telephone Encounter (Signed)
Pt was prescribed antibiotic for infection on leg. Please check and make sure he is doing better. If not completely resolved we can send in a weeks refill of antibiotic.

## 2019-11-07 ENCOUNTER — Other Ambulatory Visit (INDEPENDENT_AMBULATORY_CARE_PROVIDER_SITE_OTHER): Payer: Self-pay | Admitting: Nurse Practitioner

## 2019-11-07 ENCOUNTER — Encounter (INDEPENDENT_AMBULATORY_CARE_PROVIDER_SITE_OTHER): Payer: Medicare Other

## 2019-11-07 DIAGNOSIS — R208 Other disturbances of skin sensation: Secondary | ICD-10-CM

## 2019-11-07 DIAGNOSIS — M79604 Pain in right leg: Secondary | ICD-10-CM

## 2019-11-07 MED ORDER — AMOXICILLIN-POT CLAVULANATE 875-125 MG PO TABS: 1.0000 | ORAL_TABLET | Freq: Two times a day (BID) | ORAL | 0 refills | Status: AC

## 2019-11-07 NOTE — Telephone Encounter (Signed)
LMTCB, okay for PEC to advise patient.  

## 2019-11-07 NOTE — Telephone Encounter (Signed)
Patient states that it is still sore.  No redness or drainage just sore.  He feels like it may be a little better if not the same.

## 2019-11-07 NOTE — Telephone Encounter (Signed)
Need to stay on antibiotic an additional week. Call if not better when finished.

## 2019-11-08 ENCOUNTER — Ambulatory Visit (INDEPENDENT_AMBULATORY_CARE_PROVIDER_SITE_OTHER): Payer: Medicare Other

## 2019-11-08 ENCOUNTER — Ambulatory Visit (INDEPENDENT_AMBULATORY_CARE_PROVIDER_SITE_OTHER): Payer: Medicare Other | Admitting: Nurse Practitioner

## 2019-11-08 ENCOUNTER — Encounter (INDEPENDENT_AMBULATORY_CARE_PROVIDER_SITE_OTHER): Payer: Self-pay | Admitting: Nurse Practitioner

## 2019-11-08 ENCOUNTER — Other Ambulatory Visit: Payer: Self-pay

## 2019-11-08 VITALS — BP 132/74 | HR 60 | Ht 72.0 in | Wt 211.0 lb

## 2019-11-08 DIAGNOSIS — I10 Essential (primary) hypertension: Secondary | ICD-10-CM

## 2019-11-08 DIAGNOSIS — M79604 Pain in right leg: Secondary | ICD-10-CM | POA: Diagnosis not present

## 2019-11-08 DIAGNOSIS — R208 Other disturbances of skin sensation: Secondary | ICD-10-CM | POA: Diagnosis not present

## 2019-11-08 DIAGNOSIS — I743 Embolism and thrombosis of arteries of the lower extremities: Secondary | ICD-10-CM | POA: Diagnosis not present

## 2019-11-08 DIAGNOSIS — E782 Mixed hyperlipidemia: Secondary | ICD-10-CM | POA: Diagnosis not present

## 2019-11-08 NOTE — Progress Notes (Signed)
Subjective:    Patient ID: Joshua [name], male    DOB: 1956-02-17, 64 y.o.   MRN: [id number] Chief Complaint  Patient presents with  . Follow-up    U/s Follow up    The patient returns to the office for followup and review of the noninvasive studies.  The patient was recently seen for routine follow-up on 10/06/2019, but there has been a significant deterioration in the lower extremity symptoms.  The patient notes that the symptoms started about the first week in April.  The patient notes interval shortening of their claudication distance and development of mild rest pain symptoms. No new ulcers or wounds have occurred since the last visit.  The patient states that suddenly there was a tightness and cramping feeling in the right medial thigh and suddenly he began to have claudication symptoms that he has had before.  The discomfort is near the incision somewhat.  There have been no significant changes to the patient's overall health care.  The patient denies amaurosis fugax or recent TIA symptoms. There are no recent neurological changes noted. The patient denies history of DVT, PE or superficial thrombophlebitis. The patient denies recent episodes of angina or shortness of breath.   ABI's Rt=0.63 and Lt=1.21 (previous ABI's Rt=1.07 and Lt=1.24) Duplex US of the lower extremity arterial system shows monophasic waveforms in the right tibial arteries with severely dampened toe waveform that is nearly flat.  Patient has triphasic tibial artery waveforms on the left with good toe waveforms.   Review of Systems  Cardiovascular:       Claudication rest pain  Musculoskeletal: Positive for joint swelling.  Skin: Positive for pallor.  All other systems reviewed and are negative.      Objective:   Physical Exam Vitals reviewed.  Cardiovascular:     Rate and Rhythm: Normal rate and regular rhythm.     Pulses:          Dorsalis pedis pulses are detected w/ Doppler on the right side  and 1+ on the left side.       Posterior tibial pulses are 0 on the right side and 1+ on the left side.     Heart sounds: Normal heart sounds.  Musculoskeletal:     Right lower leg: No edema.     Left lower leg: No edema.  Neurological:     Mental Status: He is alert and oriented to person, place, and time.  Psychiatric:        Mood and Affect: Mood normal.        Behavior: Behavior normal.        Thought Content: Thought content normal.        Judgment: Judgment normal.     BP 132/74   Pulse 60   Ht 6' (1.829 m)   Wt 211 lb (95.7 kg)   BMI 28.62 kg/m   Past Medical History:  Diagnosis Date  . Allergy   . Dyspnea    former smoker. only doe  . Peripheral vascular disease ([place])     Social History   Socioeconomic History  . Marital status: Single    Spouse name: Not on file  . Number of children: Not on file  . Years of education: Not on file  . Highest education level: Not on file  Occupational History  . Occupation: unemployed  Tobacco Use  . Smoking status: Former Smoker    Packs/day: 1.50    Years: 38.00    Pack years:  57.00    Types: Cigarettes    Quit date: 12/20/2014    Years since quitting: 4.8  . Smokeless tobacco: Former Network engineer and Sexual Activity  . Alcohol use: Not Currently  . Drug use: No  . Sexual activity: Yes  Other Topics Concern  . Not on file  Social History Narrative  . Not on file   Social Determinants of Health   Financial Resource Strain:   . Difficulty of Paying Living Expenses:   Food Insecurity:   . Worried About Charity fundraiser in the Last Year:   . Arboriculturist in the Last Year:   Transportation Needs:   . Film/video editor (Medical):   Marland Kitchen Lack of Transportation (Non-Medical):   Physical Activity:   . Days of Exercise per Week:   . Minutes of Exercise per Session:   Stress:   . Feeling of Stress :   Social Connections:   . Frequency of Communication with Friends and Family:   . Frequency of Social  Gatherings with Friends and Family:   . Attends Religious Services:   . Active Member of Clubs or Organizations:   . Attends Archivist Meetings:   Marland Kitchen Marital Status:   Intimate Partner Violence:   . Fear of Current or Ex-Partner:   . Emotionally Abused:   Marland Kitchen Physically Abused:   . Sexually Abused:     Past Surgical History:  Procedure Laterality Date  . BACK SURGERY  2011  . CENTRAL LINE INSERTION Right 09/10/2016   Procedure: CENTRAL LINE INSERTION;  Surgeon: Katha Cabal, MD;  Location: [facility];  Service: Vascular;  Laterality: Right;  . COLONOSCOPY  2013 ?  . FEMORAL-POPLITEAL BYPASS GRAFT Right 09/10/2016   Procedure: BYPASS GRAFT FEMORAL-POPLITEAL ARTERY ( BELOW KNEE );  Surgeon: Katha Cabal, MD;  Location: [facility];  Service: Vascular;  Laterality: Right;  . LOWER EXTREMITY ANGIOGRAPHY Right 11/18/2016   Procedure: Lower Extremity Angiography;  Surgeon: Katha Cabal, MD;  Location: [place] [facility];  Service: Cardiovascular;  Laterality: Right;  . LOWER EXTREMITY ANGIOGRAPHY Right 12/09/2016   Procedure: Lower Extremity Angiography;  Surgeon: Katha Cabal, MD;  Location: [place] [facility];  Service: Cardiovascular;  Laterality: Right;  . LOWER EXTREMITY INTERVENTION  11/18/2016   Procedure: Lower Extremity Intervention;  Surgeon: Katha Cabal, MD;  Location: [place] [facility];  Service: Cardiovascular;;  . PERIPHERAL VASCULAR CATHETERIZATION Right 01/02/2015   Procedure: Lower Extremity Angiography;  Surgeon: Katha Cabal, MD;  Location: [place] [facility];  Service: Cardiovascular;  Laterality: Right;  . PERIPHERAL VASCULAR CATHETERIZATION Right 06/18/2015   Procedure: Lower Extremity Angiography;  Surgeon: Algernon Huxley, MD;  Location: [place] [facility];  Service: Cardiovascular;  Laterality: Right;  . PERIPHERAL VASCULAR CATHETERIZATION  06/18/2015   Procedure: Lower Extremity Intervention;  Surgeon: Algernon Huxley, MD;  Location:  [place] [facility];  Service: Cardiovascular;;  . PERIPHERAL VASCULAR CATHETERIZATION N/A 10/09/2015   Procedure: Abdominal Aortogram w/Lower Extremity;  Surgeon: Katha Cabal, MD;  Location: [place] [facility];  Service: Cardiovascular;  Laterality: N/A;  . PERIPHERAL VASCULAR CATHETERIZATION  10/09/2015   Procedure: Lower Extremity Intervention;  Surgeon: Katha Cabal, MD;  Location: [place] [facility];  Service: Cardiovascular;;  . PERIPHERAL VASCULAR CATHETERIZATION Right 10/10/2015   Procedure: Lower Extremity Angiography;  Surgeon: Algernon Huxley, MD;  Location: [place] [facility];  Service: Cardiovascular;  Laterality: Right;  .  PERIPHERAL VASCULAR CATHETERIZATION Right 10/10/2015   Procedure: Lower Extremity Intervention;  Surgeon: Algernon Huxley, MD;  Location: [place] [facility];  Service: Cardiovascular;  Laterality: Right;  . PERIPHERAL VASCULAR CATHETERIZATION Right 12/03/2015   Procedure: Lower Extremity Angiography;  Surgeon: Algernon Huxley, MD;  Location: [place] [facility];  Service: Cardiovascular;  Laterality: Right;  . PERIPHERAL VASCULAR CATHETERIZATION Right 12/04/2015   Procedure: Lower Extremity Angiography;  Surgeon: Algernon Huxley, MD;  Location: [place] [facility];  Service: Cardiovascular;  Laterality: Right;  . PERIPHERAL VASCULAR CATHETERIZATION  12/04/2015   Procedure: Lower Extremity Intervention;  Surgeon: Algernon Huxley, MD;  Location: [place] [facility];  Service: Cardiovascular;;  . PERIPHERAL VASCULAR CATHETERIZATION Right 06/30/2016   Procedure: Lower Extremity Angiography;  Surgeon: Algernon Huxley, MD;  Location: [place] [facility];  Service: Cardiovascular;  Laterality: Right;  . PERIPHERAL VASCULAR CATHETERIZATION Right 06/25/2016   Procedure: Lower Extremity Angiography;  Surgeon: Katha Cabal, MD;  Location: [place] [facility];  Service: Cardiovascular;  Laterality: Right;  . PERIPHERAL VASCULAR CATHETERIZATION Right 07/01/2016   Procedure:  Lower Extremity Angiography;  Surgeon: Katha Cabal, MD;  Location: [place] [facility];  Service: Cardiovascular;  Laterality: Right;  . PERIPHERAL VASCULAR CATHETERIZATION  07/01/2016   Procedure: Lower Extremity Intervention;  Surgeon: Katha Cabal, MD;  Location: [place] [facility];  Service: Cardiovascular;;  . PERIPHERAL VASCULAR CATHETERIZATION Right 08/01/2016   Procedure: Lower Extremity Angiography;  Surgeon: Katha Cabal, MD;  Location: [place] [facility];  Service: Cardiovascular;  Laterality: Right;  . stent placement in right leg Right     Family History  Problem Relation Age of Onset  . Diabetes Mother   . Hypertension Mother   . Heart murmur Mother   . Colon cancer Neg Hx   . 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  This represents a limb threatening ischemia and places the patient at the risk for limb loss.  Patient should undergo angiography of  the right lower extremities with the hope for intervention for limb salvage.  The risks and benefits as well as the alternative therapies was discussed in detail with the patient.  All questions were answered.  Patient agrees to proceed with angiography.  The patient will follow up with me in the office after the procedure.       2. Essential hypertension Continue antihypertensive medications as already ordered, these medications have been reviewed and there are no changes at this time.   3. Mixed hyperlipidemia Continue statin as ordered and reviewed, no changes at this time    Current Outpatient Medications on File Prior to Visit  Medication Sig Dispense Refill  . acetaminophen (TYLENOL) 650 MG CR tablet  Take 650 mg by mouth daily as needed for pain.    Marland Kitchen amoxicillin-clavulanate (AUGMENTIN) 875-125 MG tablet Take 1 tablet by mouth 2 (two) times daily for 7 days. 14 tablet 0  . aspirin EC 81 MG tablet Take 81 mg by mouth daily.    . clopidogrel (PLAVIX) 75 MG tablet TAKE 1 TABLET BY MOUTH  DAILY 90 tablet 3  . gabapentin (NEURONTIN) 300 MG capsule Take  1 capsule (300 mg total) by mouth at bedtime. 90 capsule 3  . lisinopril (ZESTRIL) 10 MG tablet Take 1 tablet (10 mg total) by mouth daily. 90 tablet 1  . simvastatin (ZOCOR) 20 MG tablet Take 1 tablet (20 mg total) by mouth daily at 6 PM. 90 tablet 1   No current facility-administered medications on file prior to visit.    There are no Patient Instructions on file for this visit. No follow-ups on file.   Kris Hartmann, NP

## 2019-11-08 NOTE — Telephone Encounter (Signed)
Pt returned call, advised per note below from provider Fisher, pt expressed understanding.

## 2019-11-08 NOTE — Telephone Encounter (Signed)
Attempted to call pt.  Left vm to return call to office for further recommendations by Dr. Caryn Section.

## 2019-11-09 ENCOUNTER — Telehealth (INDEPENDENT_AMBULATORY_CARE_PROVIDER_SITE_OTHER): Payer: Self-pay

## 2019-11-09 NOTE — Telephone Encounter (Signed)
Spoke with the patient and he is now scheduled with Dr. Delana Meyer for a RLE angio on 11/15/19 with a 11:00 am arrival time to the [facility]. Patient will do covid testing on 11/11/19 between 8-1 pm at the [facility]. Pre-procedure instructions were discussed and will be mailed to the patient.

## 2019-11-11 ENCOUNTER — Other Ambulatory Visit: Payer: Self-pay

## 2019-11-11 ENCOUNTER — Other Ambulatory Visit
Admission: RE | Admit: 2019-11-11 | Discharge: 2019-11-11 | Disposition: A | Discharge status: Home / Self Care | Payer: Medicare Other | Source: Ambulatory Visit | Attending: Vascular Surgery | Admitting: Vascular Surgery

## 2019-11-11 DIAGNOSIS — Z20822 Contact with and (suspected) exposure to covid-19: Secondary | ICD-10-CM | POA: Diagnosis not present

## 2019-11-11 DIAGNOSIS — Z01812 Encounter for preprocedural laboratory examination: Secondary | ICD-10-CM | POA: Insufficient documentation

## 2019-11-11 LAB — SARS CORONAVIRUS 2 (TAT 6-24 HRS): SARS Coronavirus 2: NEGATIVE

## 2019-11-14 ENCOUNTER — Other Ambulatory Visit (INDEPENDENT_AMBULATORY_CARE_PROVIDER_SITE_OTHER): Payer: Self-pay | Admitting: Nurse Practitioner

## 2019-11-14 MED ORDER — CEFAZOLIN SODIUM-DEXTROSE 2-4 GM/100ML-% IV SOLN
2.0000 g | Freq: Once | INTRAVENOUS | Status: AC
  Administered 2019-11-15: 2 g via INTRAVENOUS

## 2019-11-15 ENCOUNTER — Encounter
Admission: RE | Disposition: A | Discharge status: Home / Self Care | Payer: Self-pay | Source: Home / Self Care | Attending: Vascular Surgery

## 2019-11-15 ENCOUNTER — Ambulatory Visit: Payer: Medicare Other

## 2019-11-15 ENCOUNTER — Other Ambulatory Visit: Payer: Self-pay

## 2019-11-15 ENCOUNTER — Inpatient Hospital Stay
Admission: RE | Admit: 2019-11-15 | Discharge: 2019-11-16 | DRG: 271 | Disposition: A | Discharge status: Home / Self Care | Payer: Medicare Other | Attending: Vascular Surgery | Admitting: Vascular Surgery

## 2019-11-15 DIAGNOSIS — L97419 Non-pressure chronic ulcer of right heel and midfoot with unspecified severity: Secondary | ICD-10-CM | POA: Diagnosis not present

## 2019-11-15 DIAGNOSIS — I70291 Other atherosclerosis of native arteries of extremities, right leg: Principal | ICD-10-CM | POA: Diagnosis present

## 2019-11-15 DIAGNOSIS — Z79899 Other long term (current) drug therapy: Secondary | ICD-10-CM

## 2019-11-15 DIAGNOSIS — M79604 Pain in right leg: Secondary | ICD-10-CM | POA: Diagnosis not present

## 2019-11-15 DIAGNOSIS — I998 Other disorder of circulatory system: Secondary | ICD-10-CM | POA: Diagnosis present

## 2019-11-15 DIAGNOSIS — Z7982 Long term (current) use of aspirin: Secondary | ICD-10-CM

## 2019-11-15 DIAGNOSIS — I70221 Atherosclerosis of native arteries of extremities with rest pain, right leg: Secondary | ICD-10-CM | POA: Diagnosis not present

## 2019-11-15 DIAGNOSIS — I70229 Atherosclerosis of native arteries of extremities with rest pain, unspecified extremity: Secondary | ICD-10-CM | POA: Diagnosis not present

## 2019-11-15 HISTORY — PX: LOWER EXTREMITY ANGIOGRAPHY: CATH118251

## 2019-11-15 LAB — BUN: BUN: 18 mg/dL (ref 8–23)

## 2019-11-15 LAB — CREATININE, SERUM
Creatinine, Ser: 1.09 mg/dL (ref 0.61–1.24)
GFR calc Af Amer: >60 mL/min (ref >60)
GFR calc non Af Amer: >60 mL/min (ref >60)

## 2019-11-15 SURGERY — LOWER EXTREMITY ANGIOGRAPHY
Anesthesia: Moderate Sedation | Site: Leg Lower | Laterality: Right

## 2019-11-15 MED ORDER — IODIXANOL 320 MG/ML IV SOLN
INTRAVENOUS | Status: DC | PRN
  Administered 2019-11-15: 85 mL via INTRA_ARTERIAL

## 2019-11-15 MED ORDER — ALTEPLASE 2 MG IJ SOLR
INTRAMUSCULAR | Status: AC
  Filled 2019-11-15: qty 8

## 2019-11-15 MED ORDER — SODIUM CHLORIDE 0.9 % IV SOLN: INTRAVENOUS | Status: DC

## 2019-11-15 MED ORDER — TIROFIBAN (AGGRASTAT) BOLUS VIA INFUSION
INTRAVENOUS | Status: DC | PRN
  Administered 2019-11-15: 2357.5 ug via INTRAVENOUS

## 2019-11-15 MED ORDER — SIMVASTATIN 20 MG PO TABS: 20.0000 mg | ORAL_TABLET | Freq: Every day | ORAL | Status: DC

## 2019-11-15 MED ORDER — MAGNESIUM SULFATE 2 GM/50ML IV SOLN
2.0000 g | Freq: Once | INTRAVENOUS | Status: AC
  Administered 2019-11-15: 2 g via INTRAVENOUS
  Filled 2019-11-15: qty 50

## 2019-11-15 MED ORDER — ACETAMINOPHEN 500 MG PO TABS
ORAL_TABLET | ORAL | Status: AC
  Filled 2019-11-15: qty 2

## 2019-11-15 MED ORDER — HEPARIN SODIUM (PORCINE) 1000 UNIT/ML IJ SOLN
INTRAMUSCULAR | Status: AC
  Filled 2019-11-15: qty 1

## 2019-11-15 MED ORDER — TIROFIBAN (AGGRASTAT) BOLUS VIA INFUSION
25.0000 ug/kg | Freq: Once | INTRAVENOUS | Status: DC
  Filled 2019-11-15: qty 48

## 2019-11-15 MED ORDER — MIDAZOLAM HCL 2 MG/2ML IJ SOLN
INTRAMUSCULAR | Status: DC | PRN
  Administered 2019-11-15: 2 mg via INTRAVENOUS
  Administered 2019-11-15 (×2): 1 mg via INTRAVENOUS
  Administered 2019-11-15: 2 mg via INTRAVENOUS
  Administered 2019-11-15: 1 mg via INTRAVENOUS

## 2019-11-15 MED ORDER — HEPARIN SODIUM (PORCINE) 1000 UNIT/ML IJ SOLN
INTRAMUSCULAR | Status: DC | PRN
  Administered 2019-11-15: 2000 [IU] via INTRAVENOUS
  Administered 2019-11-15: 5000 [IU] via INTRAVENOUS

## 2019-11-15 MED ORDER — MIDAZOLAM HCL 5 MG/5ML IJ SOLN
INTRAMUSCULAR | Status: AC
  Filled 2019-11-15: qty 5

## 2019-11-15 MED ORDER — ONDANSETRON HCL 4 MG/2ML IJ SOLN: 4.0000 mg | Freq: Four times a day (QID) | INTRAMUSCULAR | Status: DC | PRN

## 2019-11-15 MED ORDER — TIROFIBAN HCL IN NACL 5-0.9 MG/100ML-% IV SOLN
0.1500 ug/kg/min | INTRAVENOUS | Status: DC
  Administered 2019-11-15 – 2019-11-16 (×2): 0.15 ug/kg/min via INTRAVENOUS
  Filled 2019-11-15 (×4): qty 100

## 2019-11-15 MED ORDER — LISINOPRIL 10 MG PO TABS
10.0000 mg | ORAL_TABLET | Freq: Every day | ORAL | Status: DC
  Administered 2019-11-16: 10 mg via ORAL
  Filled 2019-11-15: qty 1

## 2019-11-15 MED ORDER — DIPHENHYDRAMINE HCL 50 MG/ML IJ SOLN: 50.0000 mg | Freq: Once | INTRAMUSCULAR | Status: DC | PRN

## 2019-11-15 MED ORDER — ACETAMINOPHEN 500 MG PO TABS
1000.0000 mg | ORAL_TABLET | Freq: Four times a day (QID) | ORAL | Status: DC | PRN
  Administered 2019-11-15 – 2019-11-16 (×2): 1000 mg via ORAL
  Filled 2019-11-15: qty 2

## 2019-11-15 MED ORDER — FAMOTIDINE 20 MG PO TABS: 40.0000 mg | ORAL_TABLET | Freq: Once | ORAL | Status: DC | PRN

## 2019-11-15 MED ORDER — HYDROMORPHONE HCL 1 MG/ML IJ SOLN: 1.0000 mg | Freq: Once | INTRAMUSCULAR | Status: DC | PRN

## 2019-11-15 MED ORDER — GABAPENTIN 300 MG PO CAPS
300.0000 mg | ORAL_CAPSULE | Freq: Every day | ORAL | Status: DC
  Administered 2019-11-15: 300 mg via ORAL
  Filled 2019-11-15: qty 1

## 2019-11-15 MED ORDER — FENTANYL CITRATE (PF) 100 MCG/2ML IJ SOLN
INTRAMUSCULAR | Status: AC
  Filled 2019-11-15: qty 2

## 2019-11-15 MED ORDER — TIROFIBAN HCL IN NACL 5-0.9 MG/100ML-% IV SOLN
INTRAVENOUS | Status: AC | PRN
  Administered 2019-11-15: 0.15 ug/kg/min via INTRAVENOUS

## 2019-11-15 MED ORDER — METHYLPREDNISOLONE SODIUM SUCC 125 MG IJ SOLR: 125.0000 mg | Freq: Once | INTRAMUSCULAR | Status: DC | PRN

## 2019-11-15 MED ORDER — FENTANYL CITRATE (PF) 100 MCG/2ML IJ SOLN
INTRAMUSCULAR | Status: DC | PRN
  Administered 2019-11-15: 25 ug via INTRAVENOUS
  Administered 2019-11-15: 50 ug via INTRAVENOUS
  Administered 2019-11-15: 25 ug via INTRAVENOUS
  Administered 2019-11-15 (×2): 50 ug via INTRAVENOUS

## 2019-11-15 MED ORDER — SODIUM CHLORIDE 0.9 % IV SOLN
INTRAVENOUS | Status: AC | PRN
  Administered 2019-11-15 (×2): 250 mL via INTRAVENOUS

## 2019-11-15 MED ORDER — MIDAZOLAM HCL 2 MG/ML PO SYRP: 8.0000 mg | ORAL_SOLUTION | Freq: Once | ORAL | Status: DC | PRN

## 2019-11-15 MED ORDER — ALTEPLASE 2 MG IJ SOLR
INTRAMUSCULAR | Status: DC | PRN
  Administered 2019-11-15: 8 mg

## 2019-11-15 SURGICAL SUPPLY — 35 items
BALLN DORADO 4X100X135 (BALLOONS) ×2
BALLN LUTONIX  018 4X60X130 (BALLOONS) ×1
BALLN LUTONIX 018 4X60X130 (BALLOONS) ×1
BALLN ULTRVRSE 5X300X150 (BALLOONS) ×2
BALLON DORADO 5X100X135 (BALLOONS) ×2
BALLOON DORADO 4X100X135 (BALLOONS) ×1 IMPLANT
BALLOON DORADO 5X100X135 (BALLOONS) ×1 IMPLANT
BALLOON LUTONIX 018 4X60X130 (BALLOONS) ×1 IMPLANT
BALLOON ULTRVRSE 5X300X150 (BALLOONS) ×1 IMPLANT
CANISTER PENUMBRA ENGINE (MISCELLANEOUS) ×2 IMPLANT
CATH BEACON 5 .035 65 KMP TIP (CATHETERS) ×2 IMPLANT
CATH INFUS 135CMX50CM (CATHETERS) ×2 IMPLANT
CATH LIGHTNING 7 XTORQ 130 (CATHETERS) ×2 IMPLANT
CATH PIG 70CM (CATHETERS) ×2 IMPLANT
CATH ROTAREX 135 6FR (CATHETERS) ×2 IMPLANT
CATH SEEKER .035X135CM (CATHETERS) ×2 IMPLANT
CATH VERT 5FR 125CM (CATHETERS) ×2 IMPLANT
CATH VISTA GUIDE 6FR MPA1 (CATHETERS) ×2 IMPLANT
DEVICE PRESTO INFLATION (MISCELLANEOUS) ×2 IMPLANT
DEVICE SAFEGUARD 24CM (GAUZE/BANDAGES/DRESSINGS) ×2 IMPLANT
DEVICE STARCLOSE SE CLOSURE (Vascular Products) ×2 IMPLANT
DEVICE TORQUE .025-.038 (MISCELLANEOUS) ×2 IMPLANT
GLIDEWIRE ADV .035X260CM (WIRE) ×2 IMPLANT
GLIDEWIRE ANGLED SS 035X260CM (WIRE) ×2 IMPLANT
NEEDLE ENTRY 21GA 7CM ECHOTIP (NEEDLE) ×2 IMPLANT
PACK ANGIOGRAPHY (CUSTOM PROCEDURE TRAY) ×2 IMPLANT
SET INTRO CAPELLA COAXIAL (SET/KITS/TRAYS/PACK) ×4 IMPLANT
SHEATH BRITE TIP 5FRX11 (SHEATH) ×2 IMPLANT
SHEATH FLEXOR ANSEL2 7FRX45 (SHEATH) ×2 IMPLANT
STENT VIABAHN 6X250X120 (Permanent Stent) ×4 IMPLANT
STENT VIABAHN 6X50X120 (Permanent Stent) ×2 IMPLANT
SYR MEDRAD MARK 7 150ML (SYRINGE) ×2 IMPLANT
TUBING CONTRAST HIGH PRESS 72 (TUBING) ×2 IMPLANT
WIRE G V18X300CM (WIRE) ×2 IMPLANT
WIRE J 3MM .035X145CM (WIRE) ×2 IMPLANT

## 2019-11-15 NOTE — Progress Notes (Signed)
Brief Pharmacy Note  Pharmacy consulted for tirofiban for peripheral intervention. Based on CrCl and weight, will order 25 mcg/kg bolus followed by infusion at 0.15 mcg/kg/min x 18 hours. Will check CBC with morning labs.  Dorena Bodo, PharmD

## 2019-11-15 NOTE — H&P (Signed)
[facility] History & Physical Update  The patient was interviewed and re-examined.  The patient's previous History and Physical has been reviewed and is unchanged.  There is no change in the plan of care. We plan to proceed with the scheduled procedure.  [name], MD  11/15/2019, 11:25 AM

## 2019-11-15 NOTE — Progress Notes (Signed)
Patient has small amount of blood noted from cath site. Denies pain or pressure at site or BLE. NP B. Randol Kern notified and is currently bedside to assess patient.

## 2019-11-15 NOTE — Op Note (Signed)
OPERATIVE NOTE   PROCEDURE: 1. Right lower extremity angiography third order catheter placement 2. Ultrasound-guided access left common femoral artery for sheath placement 3. Mechanical thrombectomy of the femoral to popliteal vein bypass with both the Rota Rex device as well as the CAT 7. 4. Infusion of 8 mg of TPA with a 30-minute dwell time. 5. Percutaneous transluminal angioplasty and stent placement the entire length of the right femoral to to below-knee popliteal bypass graft. 6. Percutaneous transluminal angioplasty of the right peroneal artery to 4 mm with a Lutonix drug-eluting balloon  PRE-OPERATIVE DIAGNOSIS: Atherosclerotic occlusive disease bilateral lower extremities with rest pain of the right lower extremity.  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  ANESTHESIA: Conscious sedation was administered under my direct supervision by the interventional radiology RN. IV Versed plus fentanyl were utilized. Continuous ECG, pulse oximetry and blood pressure was monitored throughout the entire procedure.  Conscious sedation was for a total of 180 minutes.  ESTIMATED BLOOD LOSS: Minimal cc  FINDING(S): 1.  Diffuse atherosclerotic changes of the right lower extremity, thrombosis of the entire vein bypass associated with stricture stenosis of the peroneal artery  SPECIMEN(S):  None  INDICATIONS:   DARSH VANDEVOORT is a 64 y.o. y.o. male who presents with signs of sepsis secondary to infection of the right heel ulcer. He has nonpalpable pulses in association with the heel ulcer. The heel ulcer has not been healing in spite of appropriate wound care.  DESCRIPTION: After obtaining full informed written consent, the patient was brought back to the operating room and placed supine upon the operating table.  The patient received IV antibiotics prior to induction.  After obtaining adequate  sedation, the patient was prepped and draped in the standard fashion and appropriate time out is called.    Ultrasound is placed in a sterile sleeve ultrasound as utilized secondary to lack of appropriate landmarks and to avoid vascular injury. Under real-time visualization the left common femoral artery is identified is echolucent and pulsatile indicating patency. Image is recorded for the permanent record. 1% lidocaine is then infiltrated into the soft tissues with ultrasound visualization. Microneedle is then inserted into the anterior wall of the common femoral artery under direct visualization with ultrasound. Microwire followed by micro-sheath.   J-wire followed by 6 French sheath is then inserted without difficulty.  J-wire is then advanced with pigtail catheter up to the level of T12 and AP projection of the aorta is obtained. Pigtail catheter was repositioned to above the bifurcation and oblique view of the pelvis is obtained.  Using a Stiff angled Glide Wire wire and the pigtail catheter the aortic bifurcation was crossed and the catheter is advanced down to the external iliac artery. Oblique view of the femoral bifurcation is then obtained by hand injection.   Diagnostic interpretation: The abdominal aorta is opacified with a bolus injection contrast.  Is widely patent.  The aortic bifurcation bilateral common iliac arteries are widely patent there are no hemodynamically significant lesions noted.  The internal and external iliac arteries are patent bilaterally.  The right common femoral is widely patent with minimal atherosclerotic changes.  Profunda femoris is widely patent with minimal atherosclerotic changes.  SFA is occluded previously placed stents are noted.  The vein bypass is occluded at its origin.  There is reconstitution of the at knee popliteal by profunda collaterals the distal popliteal demonstrates a greater than 60% stenosis.  There is  a greater than 70% stenosis within the  tibioperoneal trunk and peroneal.  The posterior tibial appears to be widely patent from its origin down to the foot.  Based on this information I elected to move forward with treatment.  Patient was given 5000 units of heparin and the 5 French sheath was exchanged for a 7 [place] which was positioned up and over the aortic bifurcation with its tip in the right common femoral.  Using a combination of a advantage wire and the Kumpe catheter I was able to cross the occluded bypass and the Kumpe catheter was negotiated down into the distal popliteal where magnified imaging of the posterior tibial and peroneal were obtained.  A 50 cm length infusion catheter was then advanced over the wire and the obturator wire replaced the advantage wire 8 mg of TPA was then infused and allowed to dwell for approximately 30 minutes.  Following this the [place] Rex device was prepped on the field and 0.014 wire was advanced through the infusion catheter and the [place] Rex device was used to perform mechanical thrombectomy beginning at the origin of the bypass graft and extending distally.  Unfortunately because of the long length of occlusion the [place] Rex device became bound with significant amount of thrombus and I was forced to switch to a penumbra CAT 7 device.  Multiple passes again were made with a CAT 7 from the origin of the bypass down to the distal anastomosis.  This did reestablish flow but clearly there was a greater than 90% stenosis at the level of the distal anastomosis there were several areas within the bypass graft as well that it appeared to be greater than 60% stenotic at multiple levels.  However, thrombectomy did reestablish flow through the bypass graft.  I then proceeded to use a 4 mm and subsequently a 5 mm [place] balloon to treat the distal anastomosis.  This segment did not improve after stand-alone angioplasty and given the multiple other high-grade stenoses within the vein graft itself I elected to  stent the graft essentially from its origin down to the native popliteal outflow.  To accomplish this I deployed 3 6 mm Viabahn stents in sequence.  Beginning distally with a 6 mm x 25 cm Viabahn and then another 6 mm x 25 cm and then a 6 mm x 5 cm Viabahn stent.  They were all postdilated with a 5 mm Ultraverse balloon inflated to 10 to 12 atm.  Follow-up imaging demonstrated patency of the reconstruction with less than 10% residual stenosis now.  The only residual stenosis was noted within the tibioperoneal trunk and peroneal.  To treat this I advanced a 4 mm x 60 mm Lutonix drug-eluting balloon across this lesion inflation was to 4 to 6 atm for 1 minute.  Follow-up imaging demonstrated patency of the tibioperoneal trunk peroneal and posterior tibial down to the ankle with less than 5% residual stenosis.  After review of the images the catheter is removed sheath is pulled back into the left iliac system J-wire is then advanced and socially and LAO projection of the groin is obtained after review of this image a Star Cose device is deployed without difficulty.  INTERPRETATION: The abdominal aorta is opacified with a bolus injection contrast.  Is widely patent.  The aortic bifurcation bilateral common iliac arteries are widely patent there are no hemodynamically significant lesions noted.  The internal and external iliac arteries are patent bilaterally.  The right common femoral is widely patent with  minimal atherosclerotic changes.  Profunda femoris is widely patent with minimal atherosclerotic changes.  SFA is occluded previously placed stents are noted.  The vein bypass is occluded at its origin.  There is reconstitution of the at knee popliteal by profunda collaterals the distal popliteal demonstrates a greater than 60% stenosis.  There is a greater than 70% stenosis within the tibioperoneal trunk and peroneal.  The posterior tibial appears to be widely patent from its origin down to the  foot.  Following thrombectomy and associated shin with TPA there is reestablishment of flow uncovering multiple lesions within the vein bypass and the most severe of which was in the distal anastomosis.  Following angioplasty there was minimal improvement with greater than 60 to 70% residual stenosis and therefore I elected to stent the vein bypass beginning in the native popliteal extending proximally encompassing multiple other high-grade lesions.  Following angioplasty of the Viabahn stents to 5 mm there is now less than 10% residual stenosis with good flow through the bypass.  Following angioplasty of the tibioperoneal trunk peroneal 2 4 mm there is now excellent flow with less than 5% residual stenosis.  There is two-vessel runoff to the foot.  COMPLICATIONS: There were no immediate consultations.  CONDITION: Margaretmary Dys, M.D. [facility] Office: [phone]   11/15/2019,4:43 PM

## 2019-11-16 ENCOUNTER — Encounter: Payer: Self-pay | Admitting: Vascular Surgery

## 2019-11-16 ENCOUNTER — Ambulatory Visit: Payer: Medicare Other

## 2019-11-16 DIAGNOSIS — I70229 Atherosclerosis of native arteries of extremities with rest pain, unspecified extremity: Secondary | ICD-10-CM

## 2019-11-16 LAB — BASIC METABOLIC PANEL
Anion gap: 7 (ref 5–15)
BUN: 16 mg/dL (ref 8–23)
CO2: 27 mmol/L (ref 22–32)
Calcium: 8.5 mg/dL — ABNORMAL LOW (ref 8.9–10.3)
Chloride: 106 mmol/L (ref 98–111)
Creatinine, Ser: 0.86 mg/dL (ref 0.61–1.24)
GFR calc Af Amer: >60 mL/min (ref >60)
GFR calc non Af Amer: >60 mL/min (ref >60)
Glucose, Bld: 113 mg/dL — ABNORMAL HIGH (ref 70–99)
Potassium: 4 mmol/L (ref 3.5–5.1)
Sodium: 140 mmol/L (ref 135–145)

## 2019-11-16 LAB — CBC
HCT: 34.9 % — ABNORMAL LOW (ref 39.0–52.0)
Hemoglobin: 11.3 g/dL — ABNORMAL LOW (ref 13.0–17.0)
MCH: 26.5 pg (ref 26.0–34.0)
MCHC: 32.4 g/dL (ref 30.0–36.0)
MCV: 81.9 fL (ref 80.0–100.0)
Platelets: 217 10*3/uL (ref 150–400)
RBC: 4.26 MIL/uL (ref 4.22–5.81)
RDW: 14.7 % (ref 11.5–15.5)
WBC: 5 10*3/uL (ref 4.0–10.5)
nRBC: 0 % (ref 0.0–0.2)

## 2019-11-16 LAB — MAGNESIUM: Magnesium: 2.7 mg/dL — ABNORMAL HIGH (ref 1.7–2.4)

## 2019-11-16 MED ORDER — APIXABAN 5 MG PO TABS
5.0000 mg | ORAL_TABLET | Freq: Two times a day (BID) | ORAL | Status: DC
  Administered 2019-11-16: 5 mg via ORAL
  Filled 2019-11-16: qty 1

## 2019-11-16 MED ORDER — APIXABAN 5 MG PO TABS: 5.0000 mg | ORAL_TABLET | Freq: Two times a day (BID) | ORAL | 5 refills | Status: DC

## 2019-11-16 MED ORDER — ALUM & MAG HYDROXIDE-SIMETH 200-200-20 MG/5ML PO SUSP
30.0000 mL | ORAL | Status: DC | PRN
  Administered 2019-11-16: 30 mL via ORAL

## 2019-11-16 MED ORDER — ALUM & MAG HYDROXIDE-SIMETH 200-200-20 MG/5ML PO SUSP
15.0000 mL | ORAL | Status: DC | PRN
  Filled 2019-11-16: qty 30

## 2019-11-16 NOTE — TOC Benefit Eligibility Note (Signed)
Transition of Care [facility]) Benefit Eligibility Note    Patient Details  Name: BENGIE KAUCHER MRN: [id number] Date of Birth: Jul 24, 1955   Medication/Dose: Eliquis 85m BID  Covered?: Yes  Prescription Coverage [place] [place] Person/Company/Phone Number:: RJoneen Carawaywith [facility] at [phone] Co-Pay: $142.00 estimated cost for 30 day supply retail, $226.00 estimated cost for 90 day mail order  Prior Approval: No  Deductible: Met($95 deductible met as of time of call.)   HDannette BarbaraPhone Number: [phone] [phone]/28/2021, 11:38 AM

## 2019-11-16 NOTE — Progress Notes (Signed)
All medications administered by Judson Roch May Student Nurse were supervised by this RN Belenda Alviar A Raymone Pembroke

## 2019-11-16 NOTE — Progress Notes (Signed)
Patient alert and oriented, vss, no complaints of pain.  D/C telemetry and PIV.  F/u with Dr. Delana Meyer in one week.  Given vascular discharge instructions.  Patient will be escorted out of hospital via wheelchair by volunteers.

## 2019-11-16 NOTE — TOC Progression Note (Signed)
Transition of Care [facility]) - Progression Note    Patient Details  Name: Joshua [name] MRN: [id number] Date of Birth: 02-20-1956  [facility] CM/SW Wallace Ridge, RN Phone Number: 11/16/2019, 1:49 PM  Clinical Narrative:     Met with patient in room, shared with him information regarding his cost for Eliquis.  He reports that is to expensive and he cannot afford that.  He states he was previously on the $10 program.  I gave patient the information for program.  He states he is going to call and see if he can get re-enrolled.  This information was given to attending and bedside RN.       Expected Discharge Plan and Services                                                 Social Determinants of Health (SDOH) Interventions    Readmission Risk Interventions No flowsheet data found.

## 2019-11-16 NOTE — TOC Benefit Eligibility Note (Signed)
Transition of Care [facility]) Benefit Eligibility Note    Patient Details  Name: Joshua [name] MRN: [id number] Date of Birth: [birth year]   Medication/Dose: Eliquis 66m BID  Covered?: Yes     Prescription Coverage [facility] [place] Person/Company/Phone Number:: RJoneen Carawaywith [facility] at [phone] Co-Pay: $142.00 estimated cost for 30 day supply retail, $226.00 estimated cost for 90 day mail order  Prior Approval: No  Deductible: Met($95 deductible met as of time of call.)       VVictorino Dike RN Phone Number: 11/16/2019, 1:47 PM

## 2019-11-16 NOTE — Discharge Summary (Signed)
[facility]    Discharge Summary  Patient ID:  Joshua [name] MRN: [id number] DOB/AGE: 03-12-1956 64 y.o.  Admit date: 11/15/2019 Discharge date: 11/16/2019 Date of Surgery: 11/15/2019 Surgeon: Surgeon(s): Schnier, Dolores Lory, MD  Admission Diagnosis: Ischemia of right lower extremity [I99.8]  Discharge Diagnoses:  Ischemia of right lower extremity [I99.8]  Secondary Diagnoses: Past Medical History:  Diagnosis Date  . Allergy   . Dyspnea    former smoker. only doe  . Peripheral vascular disease ([place])    Procedure(s): LOWER EXTREMITY ANGIOGRAPHY (Right)  Discharged Condition: Good  HPI / Hospital Course:  The patient is a 64 year old male with known history of atherosclerotic disease to the lower extremity specifically the right lower extremity. The patient was recently seen for routine follow-up on 10/06/2019, but there has been a significant deterioration in the lower extremity symptoms.  The patient notes that the symptoms started about the first week in April.  The patient notes interval shortening of their claudication distance and development of mild rest pain symptoms. No new ulcers or wounds have occurred since the last visit.  The patient states that suddenly there was a tightness and cramping feeling in the right medial thigh and suddenly he began to have claudication symptoms that he has had before. On 11/15/19, the patient underwent:  1. Right lower extremity angiography third order catheter placement 2. Ultrasound-guided access left common femoral artery for sheath placement 3. Mechanical thrombectomy of the femoral to popliteal vein bypass with both the Rota Rex device as well as the CAT 7. 4. Infusion of 8 mg of TPA with a 30-minute dwell time. 5. Percutaneous transluminal angioplasty and stent placement the entire length of the right femoral to to below-knee popliteal bypass graft. 6. Percutaneous transluminal angioplasty of the right  peroneal artery to 4 mm with a Lutonix drug-eluting balloon  The patient tolerated the procedure well and was transferred from the vascular suite to the surgical floor without issue.  The patient was treated with Aggrastat overnight.  Aggrastat stopped and Eliquis started postop day #1.  During the patient's brief stay, his diet was advanced he was urinating his pain was controlled with use of p.o. pain medication he was ambulating at baseline.  Upon discharge, the patient was afebrile with stable vital signs and improved physical exam.  Physical exam: Alert and oriented x3, no acute distress Cardiovascular: Regular rate and rhythm Pulmonary: Clear to auscultation bilaterally Abdomen: Soft, nontender, nondistended positive bowel sounds Left groin:  Access site: Clean dry intact. Right lower extremity  Thigh soft.  Calf soft.  Extremities warm distally in toes.  There is no acute vascular compromise noted at this time.  Motor/sensory is intact.  Labs: As below  Complications: None  Consults: None  Significant Diagnostic Studies: CBC Lab Results  Component Value Date   WBC 5.0 11/16/2019   HGB 11.3 (L) 11/16/2019   HCT 34.9 (L) 11/16/2019   MCV 81.9 11/16/2019   PLT 217 11/16/2019   BMET    Component Value Date/Time   NA 140 11/16/2019 0521   NA 141 10/14/2019 1101   K 4.0 11/16/2019 0521   CL 106 11/16/2019 0521   CO2 27 11/16/2019 0521   GLUCOSE 113 (H) 11/16/2019 0521   BUN 16 11/16/2019 0521   BUN 15 10/14/2019 1101   BUN 16 07/03/2014 1205   CREATININE 0.86 11/16/2019 0521   CREATININE 1.34 (H) 07/03/2014 1205   CALCIUM 8.5 (L) 11/16/2019 0521   GFRNONAA >60  11/16/2019 0521   GFRNONAA 59 (L) 07/03/2014 1205   GFRAA >60 11/16/2019 0521   GFRAA >60 07/03/2014 1205   COAG Lab Results  Component Value Date   INR 1.11 06/25/2016   INR 1.90 05/12/2016   INR 1.20 12/05/2015   Disposition:  Discharge to :Home  Allergies as of 11/16/2019   No Known Allergies      Medication List    STOP taking these medications   aspirin EC 81 MG tablet   clopidogrel 75 MG tablet Commonly known as: PLAVIX     TAKE these medications   acetaminophen 650 MG CR tablet Commonly known as: TYLENOL Take 650 mg by mouth daily as needed (headache/pain).   apixaban 5 MG Tabs tablet Commonly known as: ELIQUIS Take 1 tablet (5 mg total) by mouth 2 (two) times daily.   gabapentin 300 MG capsule Commonly known as: NEURONTIN Take 1 capsule (300 mg total) by mouth at bedtime.   lisinopril 10 MG tablet Commonly known as: ZESTRIL Take 1 tablet (10 mg total) by mouth daily.   simvastatin 20 MG tablet Commonly known as: ZOCOR Take 1 tablet (20 mg total) by mouth daily at 6 PM.      Verbal and written Discharge instructions given to the patient. Wound care per Discharge AVS Follow-up Information    Schnier, Dolores Lory, MD Follow up in 1 month(s).   Specialties: Vascular Surgery, Cardiology, Radiology, Vascular Surgery Why: Can see Schnier or Arna Medici. Will need ABI with visit.  Contact information: [place] [address] [phone]          Signed: Sela Hua, PA-C  11/16/2019, 3:08 PM

## 2019-11-24 ENCOUNTER — Other Ambulatory Visit: Payer: Self-pay | Admitting: Family Medicine

## 2019-11-24 MED ORDER — GABAPENTIN 300 MG PO CAPS: 300.0000 mg | ORAL_CAPSULE | Freq: Every day | ORAL | 1 refills | Status: DC

## 2019-11-24 MED ORDER — SIMVASTATIN 20 MG PO TABS: 20.0000 mg | ORAL_TABLET | Freq: Every day | ORAL | 1 refills | Status: DC

## 2019-11-24 MED ORDER — LISINOPRIL 10 MG PO TABS: 10.0000 mg | ORAL_TABLET | Freq: Every day | ORAL | 1 refills | Status: DC

## 2019-11-24 NOTE — Telephone Encounter (Signed)
[facility] faxed refill request for the following medications:  [facility] [phone] - phone [phone] - fax  lisinopril (ZESTRIL) 10 MG tablet simvastatin (ZOCOR) 20 MG tablet gabapentin (NEURONTIN) 300 MG capsule  Please advise.  Thanks, [employer]

## 2019-11-25 ENCOUNTER — Telehealth (INDEPENDENT_AMBULATORY_CARE_PROVIDER_SITE_OTHER): Payer: Self-pay | Admitting: Vascular Surgery

## 2019-11-25 NOTE — Telephone Encounter (Addendum)
Called wanting to know if it was possible to send a 90day RX of Eliquis sent to his pharmacy on file.

## 2019-11-25 NOTE — Telephone Encounter (Signed)
Rx has been faxed [facility] per patient request [phone]

## 2019-11-28 ENCOUNTER — Telehealth (INDEPENDENT_AMBULATORY_CARE_PROVIDER_SITE_OTHER): Payer: Self-pay | Admitting: Vascular Surgery

## 2019-11-28 NOTE — Telephone Encounter (Signed)
Called stating that he is still having issues with his right leg/calf. I talked to [facility] and we are going to move his appt up for the 27th of this month w/ ABI's. This phone note is for documentation purposes only. Thank you

## 2019-11-28 NOTE — Telephone Encounter (Signed)
Refax medication refill request to [facility]@800 -[phone] due needing to change fax number

## 2019-11-29 ENCOUNTER — Ambulatory Visit: Payer: Medicare Other | Attending: Internal Medicine

## 2019-11-29 ENCOUNTER — Other Ambulatory Visit (INDEPENDENT_AMBULATORY_CARE_PROVIDER_SITE_OTHER): Payer: Self-pay | Admitting: Vascular Surgery

## 2019-11-29 DIAGNOSIS — T82898D Other specified complication of vascular prosthetic devices, implants and grafts, subsequent encounter: Secondary | ICD-10-CM

## 2019-11-29 DIAGNOSIS — Z23 Encounter for immunization: Secondary | ICD-10-CM

## 2019-11-29 DIAGNOSIS — Z95820 Peripheral vascular angioplasty status with implants and grafts: Secondary | ICD-10-CM

## 2019-11-29 NOTE — Progress Notes (Signed)
   Covid-19 Vaccination Clinic  Name:  Joshua [name]    MRN: [id number] DOB: 1956-04-20  11/29/2019  [name] was observed post Covid-19 immunization for 15 minutes without incident. He was provided with Vaccine Information Sheet and instruction to access the V-Safe system.   [name] was instructed to call 911 with any severe reactions post vaccine: Marland Kitchen Difficulty breathing  . Swelling of face and throat  . A fast heartbeat  . A bad rash all over body  . Dizziness and weakness   Immunizations Administered    Name Date Dose VIS Date Route   Pfizer COVID-19 Vaccine 11/29/2019  8:24 AM 0.3 mL 09/14/2018 Intramuscular   Manufacturer: [place]   Lot: [id number]   [place]: [id number]

## 2019-11-30 ENCOUNTER — Ambulatory Visit (INDEPENDENT_AMBULATORY_CARE_PROVIDER_SITE_OTHER): Payer: Medicare Other

## 2019-11-30 ENCOUNTER — Telehealth (INDEPENDENT_AMBULATORY_CARE_PROVIDER_SITE_OTHER): Payer: Self-pay

## 2019-11-30 ENCOUNTER — Other Ambulatory Visit: Payer: Self-pay

## 2019-11-30 ENCOUNTER — Other Ambulatory Visit (INDEPENDENT_AMBULATORY_CARE_PROVIDER_SITE_OTHER): Payer: Self-pay | Admitting: Vascular Surgery

## 2019-11-30 ENCOUNTER — Encounter (INDEPENDENT_AMBULATORY_CARE_PROVIDER_SITE_OTHER): Payer: Self-pay | Admitting: Nurse Practitioner

## 2019-11-30 ENCOUNTER — Ambulatory Visit (INDEPENDENT_AMBULATORY_CARE_PROVIDER_SITE_OTHER): Payer: Medicare Other | Admitting: Nurse Practitioner

## 2019-11-30 ENCOUNTER — Encounter (INDEPENDENT_AMBULATORY_CARE_PROVIDER_SITE_OTHER): Payer: Self-pay

## 2019-11-30 VITALS — BP 139/73 | HR 78 | Resp 16 | Wt 208.6 lb

## 2019-11-30 DIAGNOSIS — Z95820 Peripheral vascular angioplasty status with implants and grafts: Secondary | ICD-10-CM | POA: Diagnosis not present

## 2019-11-30 DIAGNOSIS — I10 Essential (primary) hypertension: Secondary | ICD-10-CM | POA: Diagnosis not present

## 2019-11-30 DIAGNOSIS — T82898D Other specified complication of vascular prosthetic devices, implants and grafts, subsequent encounter: Secondary | ICD-10-CM | POA: Diagnosis not present

## 2019-11-30 DIAGNOSIS — E782 Mixed hyperlipidemia: Secondary | ICD-10-CM | POA: Diagnosis not present

## 2019-11-30 NOTE — Telephone Encounter (Signed)
Patient was seen in our office and scheduled for angio with Dr. Delana Meyer on 12/02/19 with a 11:15 am arrival time to the [facility]. Patent will do covid testing on 12/01/19 between 8-1 pm at the [facility]. Pre-procedure instructions were discussed and given to the patient.

## 2019-12-01 ENCOUNTER — Inpatient Hospital Stay
Admission: AD | Admit: 2019-12-01 | Discharge: 2019-12-05 | DRG: 272 | Disposition: A | Discharge status: Home / Self Care | Payer: Medicare Other | Source: Ambulatory Visit | Attending: Vascular Surgery | Admitting: Vascular Surgery

## 2019-12-01 ENCOUNTER — Encounter (INDEPENDENT_AMBULATORY_CARE_PROVIDER_SITE_OTHER): Payer: Self-pay | Admitting: Nurse Practitioner

## 2019-12-01 ENCOUNTER — Other Ambulatory Visit
Admission: RE | Admit: 2019-12-01 | Discharge: 2019-12-01 | Disposition: A | Discharge status: Home / Self Care | Payer: Medicare Other | Source: Ambulatory Visit | Attending: Vascular Surgery | Admitting: Vascular Surgery

## 2019-12-01 ENCOUNTER — Encounter
Admission: AD | Disposition: A | Discharge status: Home / Self Care | Payer: Self-pay | Source: Ambulatory Visit | Attending: Vascular Surgery

## 2019-12-01 DIAGNOSIS — I998 Other disorder of circulatory system: Secondary | ICD-10-CM | POA: Diagnosis not present

## 2019-12-01 DIAGNOSIS — I10 Essential (primary) hypertension: Secondary | ICD-10-CM | POA: Diagnosis not present

## 2019-12-01 DIAGNOSIS — Z20822 Contact with and (suspected) exposure to covid-19: Secondary | ICD-10-CM | POA: Diagnosis not present

## 2019-12-01 DIAGNOSIS — Y838 Other surgical procedures as the cause of abnormal reaction of the patient, or of later complication, without mention of misadventure at the time of the procedure: Secondary | ICD-10-CM | POA: Diagnosis present

## 2019-12-01 DIAGNOSIS — T82868D Thrombosis of vascular prosthetic devices, implants and grafts, subsequent encounter: Secondary | ICD-10-CM

## 2019-12-01 DIAGNOSIS — Z87891 Personal history of nicotine dependence: Secondary | ICD-10-CM

## 2019-12-01 DIAGNOSIS — I743 Embolism and thrombosis of arteries of the lower extremities: Secondary | ICD-10-CM | POA: Diagnosis present

## 2019-12-01 DIAGNOSIS — I70221 Atherosclerosis of native arteries of extremities with rest pain, right leg: Principal | ICD-10-CM | POA: Diagnosis present

## 2019-12-01 DIAGNOSIS — R609 Edema, unspecified: Secondary | ICD-10-CM | POA: Diagnosis present

## 2019-12-01 DIAGNOSIS — T82868A Thrombosis of vascular prosthetic devices, implants and grafts, initial encounter: Secondary | ICD-10-CM | POA: Diagnosis not present

## 2019-12-01 DIAGNOSIS — E782 Mixed hyperlipidemia: Secondary | ICD-10-CM | POA: Diagnosis not present

## 2019-12-01 HISTORY — PX: LOWER EXTREMITY ANGIOGRAPHY: CATH118251

## 2019-12-01 LAB — CBC
HCT: 36.4 % — ABNORMAL LOW (ref 39.0–52.0)
Hemoglobin: 11.6 g/dL — ABNORMAL LOW (ref 13.0–17.0)
MCH: 26.6 pg (ref 26.0–34.0)
MCHC: 31.9 g/dL (ref 30.0–36.0)
MCV: 83.5 fL (ref 80.0–100.0)
Platelets: 241 10*3/uL (ref 150–400)
RBC: 4.36 MIL/uL (ref 4.22–5.81)
RDW: 14.6 % (ref 11.5–15.5)
WBC: 7.4 10*3/uL (ref 4.0–10.5)
nRBC: 0 % (ref 0.0–0.2)

## 2019-12-01 LAB — HEPARIN LEVEL (UNFRACTIONATED): Heparin Unfractionated: 3.4 IU/mL — ABNORMAL HIGH (ref 0.30–0.70)

## 2019-12-01 LAB — MRSA PCR SCREENING: MRSA by PCR: NEGATIVE

## 2019-12-01 LAB — CREATININE, SERUM
Creatinine, Ser: 1.13 mg/dL (ref 0.61–1.24)
GFR calc Af Amer: >60 mL/min (ref >60)
GFR calc non Af Amer: >60 mL/min (ref >60)

## 2019-12-01 LAB — SARS CORONAVIRUS 2 BY RT PCR (HOSPITAL ORDER, PERFORMED IN ~~LOC~~ HOSPITAL LAB): SARS Coronavirus 2: NEGATIVE

## 2019-12-01 LAB — FIBRINOGEN: Fibrinogen: 499 mg/dL — ABNORMAL HIGH (ref 210–475)

## 2019-12-01 LAB — BUN: BUN: 16 mg/dL (ref 8–23)

## 2019-12-01 LAB — GLUCOSE, CAPILLARY
Glucose-Capillary: 63 mg/dL — ABNORMAL LOW (ref 70–99)
Glucose-Capillary: 76 mg/dL (ref 70–99)

## 2019-12-01 SURGERY — LOWER EXTREMITY ANGIOGRAPHY
Anesthesia: Moderate Sedation | Laterality: Right

## 2019-12-01 MED ORDER — SODIUM CHLORIDE 0.9 % IV SOLN
0.5000 mg/h | INTRAVENOUS | Status: DC
  Administered 2019-12-01: 0.5 mg/h
  Filled 2019-12-01: qty 10

## 2019-12-01 MED ORDER — SODIUM CHLORIDE 0.9 % IV SOLN: 250.0000 mL | INTRAVENOUS | Status: DC | PRN

## 2019-12-01 MED ORDER — HEPARIN SODIUM (PORCINE) 1000 UNIT/ML IJ SOLN
INTRAMUSCULAR | Status: DC | PRN
  Administered 2019-12-01: 3000 [IU] via INTRAVENOUS

## 2019-12-01 MED ORDER — FENTANYL CITRATE (PF) 100 MCG/2ML IJ SOLN
INTRAMUSCULAR | Status: DC | PRN
  Administered 2019-12-01: 50 ug via INTRAVENOUS
  Administered 2019-12-01 (×2): 25 ug via INTRAVENOUS

## 2019-12-01 MED ORDER — KETOROLAC TROMETHAMINE 30 MG/ML IJ SOLN
30.0000 mg | Freq: Four times a day (QID) | INTRAMUSCULAR | Status: AC
  Administered 2019-12-01 – 2019-12-02 (×2): 30 mg via INTRAVENOUS
  Filled 2019-12-01 (×2): qty 1

## 2019-12-01 MED ORDER — HYDROMORPHONE HCL 1 MG/ML IJ SOLN
1.0000 mg | INTRAMUSCULAR | Status: AC
  Administered 2019-12-01: 1 mg via INTRAVENOUS
  Filled 2019-12-01: qty 1

## 2019-12-01 MED ORDER — MIDAZOLAM HCL 2 MG/2ML IJ SOLN
INTRAMUSCULAR | Status: AC
  Filled 2019-12-01: qty 4

## 2019-12-01 MED ORDER — GABAPENTIN 300 MG PO CAPS
300.0000 mg | ORAL_CAPSULE | Freq: Every day | ORAL | Status: DC
  Administered 2019-12-01 – 2019-12-04 (×4): 300 mg via ORAL
  Filled 2019-12-01 (×4): qty 1

## 2019-12-01 MED ORDER — HEPARIN (PORCINE) 25000 UT/250ML-% IV SOLN
INTRAVENOUS | Status: AC
  Administered 2019-12-01: 600 [IU]/h via INTRAVENOUS
  Filled 2019-12-01: qty 250

## 2019-12-01 MED ORDER — MORPHINE SULFATE (PF) 4 MG/ML IV SOLN
INTRAVENOUS | Status: AC
  Filled 2019-12-01: qty 1

## 2019-12-01 MED ORDER — LISINOPRIL 10 MG PO TABS: 10.0000 mg | ORAL_TABLET | Freq: Every day | ORAL | Status: DC

## 2019-12-01 MED ORDER — SODIUM CHLORIDE 0.9% FLUSH: 3.0000 mL | INTRAVENOUS | Status: DC | PRN

## 2019-12-01 MED ORDER — HEPARIN SODIUM (PORCINE) 1000 UNIT/ML IJ SOLN
INTRAMUSCULAR | Status: AC
  Filled 2019-12-01: qty 1

## 2019-12-01 MED ORDER — OXYCODONE HCL 5 MG PO TABS
5.0000 mg | ORAL_TABLET | ORAL | Status: DC | PRN
  Administered 2019-12-02: 5 mg via ORAL
  Administered 2019-12-02 – 2019-12-04 (×3): 10 mg via ORAL
  Filled 2019-12-01: qty 1
  Filled 2019-12-01 (×3): qty 2

## 2019-12-01 MED ORDER — SODIUM CHLORIDE 0.9 % IV SOLN: 250.0000 mL | INTRAVENOUS | Status: DC

## 2019-12-01 MED ORDER — SODIUM CHLORIDE 0.9 % IV SOLN: INTRAVENOUS | Status: DC

## 2019-12-01 MED ORDER — ONDANSETRON HCL 4 MG/2ML IJ SOLN: 4.0000 mg | Freq: Four times a day (QID) | INTRAMUSCULAR | Status: DC | PRN

## 2019-12-01 MED ORDER — MORPHINE SULFATE (PF) 2 MG/ML IV SOLN
2.0000 mg | INTRAVENOUS | Status: DC | PRN
  Administered 2019-12-01: 2 mg via INTRAVENOUS
  Filled 2019-12-01: qty 1

## 2019-12-01 MED ORDER — SODIUM CHLORIDE 0.9 % IV SOLN
1.0000 mg/h | INTRAVENOUS | Status: DC
  Administered 2019-12-01: 1 mg/h
  Filled 2019-12-01: qty 10

## 2019-12-01 MED ORDER — MIDAZOLAM HCL 2 MG/ML PO SYRP: 8.0000 mg | ORAL_SOLUTION | Freq: Once | ORAL | Status: DC | PRN

## 2019-12-01 MED ORDER — SIMVASTATIN 20 MG PO TABS
20.0000 mg | ORAL_TABLET | Freq: Every day | ORAL | Status: DC
  Administered 2019-12-01 – 2019-12-04 (×4): 20 mg via ORAL
  Filled 2019-12-01 (×5): qty 1

## 2019-12-01 MED ORDER — MORPHINE SULFATE (PF) 4 MG/ML IV SOLN
5.0000 mg | INTRAVENOUS | Status: DC | PRN
  Administered 2019-12-01: 5 mg via INTRAVENOUS

## 2019-12-01 MED ORDER — MIDAZOLAM HCL 2 MG/2ML IJ SOLN: 1.0000 mg | INTRAMUSCULAR | Status: DC | PRN

## 2019-12-01 MED ORDER — FENTANYL CITRATE (PF) 100 MCG/2ML IJ SOLN
INTRAMUSCULAR | Status: AC
  Filled 2019-12-01: qty 2

## 2019-12-01 MED ORDER — MIDAZOLAM HCL 2 MG/2ML IJ SOLN
INTRAMUSCULAR | Status: DC | PRN
  Administered 2019-12-01: 1 mg via INTRAVENOUS
  Administered 2019-12-01: 2 mg via INTRAVENOUS
  Administered 2019-12-01: 1 mg via INTRAVENOUS

## 2019-12-01 MED ORDER — DIPHENHYDRAMINE HCL 50 MG/ML IJ SOLN: 50.0000 mg | Freq: Once | INTRAMUSCULAR | Status: DC | PRN

## 2019-12-01 MED ORDER — METHYLPREDNISOLONE SODIUM SUCC 125 MG IJ SOLR: 125.0000 mg | Freq: Once | INTRAMUSCULAR | Status: DC | PRN

## 2019-12-01 MED ORDER — IODIXANOL 320 MG/ML IV SOLN
INTRAVENOUS | Status: DC | PRN
  Administered 2019-12-01: 30 mL via INTRA_ARTERIAL

## 2019-12-01 MED ORDER — CEFAZOLIN SODIUM-DEXTROSE 2-4 GM/100ML-% IV SOLN
2.0000 g | Freq: Once | INTRAVENOUS | Status: AC
  Administered 2019-12-01: 2 g via INTRAVENOUS

## 2019-12-01 MED ORDER — ACETAMINOPHEN 500 MG PO TABS
1000.0000 mg | ORAL_TABLET | Freq: Four times a day (QID) | ORAL | Status: DC
  Administered 2019-12-01: 1000 mg via ORAL
  Filled 2019-12-01: qty 2

## 2019-12-01 MED ORDER — FAMOTIDINE 20 MG PO TABS: 40.0000 mg | ORAL_TABLET | Freq: Once | ORAL | Status: DC | PRN

## 2019-12-01 MED ORDER — LABETALOL HCL 5 MG/ML IV SOLN: 10.0000 mg | INTRAVENOUS | Status: DC | PRN

## 2019-12-01 MED ORDER — CHLORHEXIDINE GLUCONATE CLOTH 2 % EX PADS
6.0000 | MEDICATED_PAD | Freq: Every day | CUTANEOUS | Status: DC
  Administered 2019-12-01: 6 via TOPICAL

## 2019-12-01 MED ORDER — HYDROMORPHONE HCL 1 MG/ML IJ SOLN: 1.0000 mg | Freq: Once | INTRAMUSCULAR | Status: DC | PRN

## 2019-12-01 MED ORDER — ALTEPLASE 2 MG IJ SOLR
INTRAMUSCULAR | Status: DC | PRN
  Administered 2019-12-01: 6 mg

## 2019-12-01 MED ORDER — METOPROLOL TARTRATE 5 MG/5ML IV SOLN: 5.0000 mg | Freq: Four times a day (QID) | INTRAVENOUS | Status: DC | PRN

## 2019-12-01 MED ORDER — HEPARIN (PORCINE) 25000 UT/250ML-% IV SOLN: 650.0000 [IU]/h | INTRAVENOUS | Status: DC

## 2019-12-01 MED ORDER — HYDRALAZINE HCL 20 MG/ML IJ SOLN: 5.0000 mg | INTRAMUSCULAR | Status: DC | PRN

## 2019-12-01 MED ORDER — SODIUM CHLORIDE 0.9% FLUSH
3.0000 mL | Freq: Two times a day (BID) | INTRAVENOUS | Status: DC
  Administered 2019-12-01 – 2019-12-05 (×7): 3 mL via INTRAVENOUS

## 2019-12-01 MED ORDER — METOPROLOL TARTRATE 5 MG/5ML IV SOLN: 2.0000 mg | INTRAVENOUS | Status: DC | PRN

## 2019-12-01 SURGICAL SUPPLY — 20 items
CATH BEACON 5 .035 65 KMP TIP (CATHETERS) ×1 IMPLANT
CATH CXI 4F 90 DAV (CATHETERS) ×1 IMPLANT
CATH CXI SUPP ANG 4FR 135 (CATHETERS) IMPLANT
CATH CXI SUPP ANG 4FR 135CM (CATHETERS) ×2
CATH INFUS 135CMX50CM (CATHETERS) ×1 IMPLANT
CATH PIG 70CM (CATHETERS) ×1 IMPLANT
DEVICE TORQUE .014-.018 (MISCELLANEOUS) IMPLANT
DEVICE TORQUE .025-.038 (MISCELLANEOUS) ×1 IMPLANT
GLIDEWIRE ADV .035X180CM (WIRE) ×1 IMPLANT
GUIDEWIRE ADV .018X180CM (WIRE) ×1 IMPLANT
KIT CV MULTILUMEN 7FR 20 (SET/KITS/TRAYS/PACK) ×2
KIT CV MULTILUMEN 7FR 20 SUB (SET/KITS/TRAYS/PACK) IMPLANT
PACK ANGIOGRAPHY (CUSTOM PROCEDURE TRAY) ×1 IMPLANT
SHEATH BRITE TIP 5FRX11 (SHEATH) ×1 IMPLANT
SHEATH FLEXOR ANSEL2 7FRX45 (SHEATH) ×1 IMPLANT
SYR MEDRAD MARK 7 150ML (SYRINGE) ×1 IMPLANT
TORQUE DEVICE .014-.018 (MISCELLANEOUS) ×2
TUBING CONTRAST HIGH PRESS 72 (TUBING) ×1 IMPLANT
WIRE J 3MM .035X145CM (WIRE) ×1 IMPLANT
WIRE MAGIC TORQUE 260C (WIRE) ×1 IMPLANT

## 2019-12-01 NOTE — H&P (Addendum)
[facility] Admission History & Physical  MRN : [id number]  Joshua [name] is a 64 y.o. (06-15-1956) male who presents with chief complaint of scheduled right lower extremity angiogram.  History of Present Illness:  The patient returns to the office for followup and review of the noninvasive studies. There has been a significant deterioration in the lower extremity symptoms.  The patient notes interval shortening of their claudication distance and development of mild rest pain symptoms. No new ulcers or wounds have occurred since the last visit.  The patient underwent intervention to his right lower extremity on 11/15/2019.  He had extensive intervention including:  PROCEDURE: 1.         Right lower extremity angiography third order catheter placement 2.         Ultrasound-guided access left common femoral artery for sheath placement 3.         Mechanical thrombectomy of the femoral to popliteal vein bypass with both the Rota Rex device as well as the CAT 7. 4.         Infusion of 8 mg of TPA with a 30-minute dwell time. 5.         Percutaneous transluminal angioplasty and stent placement the entire length of the right femoral to to below-knee popliteal bypass graft. 6.         Percutaneous transluminal angioplasty of the right peroneal artery to 4 mm with a Lutonix drug-eluting balloon  The patient states that his leg felt much better following the procedure however approximately 4 to 5 days after until he suddenly had pain again.  The patient also notes at this time his toes ache and throb as well.  He denies any new ulceration formation or discoloration of his toes.  There have been no significant changes to the patient's overall health care.  The patient denies amaurosis fugax or recent TIA symptoms. There are no recent neurological changes noted. The patient denies history of DVT, PE or superficial thrombophlebitis. The patient denies recent episodes of  angina or shortness of breath.   ABI's Rt=0.50 and Lt=1.24 (previous ABI's Rt=0.63 and Lt=1.21) Duplex US of the lower extremity arterial system shows triphasic waveforms in the left tibial arteries.  A limited duplex was done to the right lower extremity and the right femoropopliteal bypass graft with new stents is totally occluded.  There is absent flow in the toes.  Current Facility-Administered Medications  Medication Dose Route Frequency Provider Last Rate Last Admin  . 0.9 %  sodium chloride infusion   Intravenous Continuous Danzell Birky A, PA-C 75 mL/hr at 12/01/19 1650 New Bag at 12/01/19 1650  . 0.9 %  sodium chloride infusion  250 mL Intravenous PRN Xaiden Fleig A, PA-C      . alteplase (LIMB ISCHEMIA) 10 mg in normal saline (0.02 mg/mL) infusion  1 mg/hr Intracatheter Continuous Frank Pilger A, PA-C      . alteplase (LIMB ISCHEMIA) 10 mg in normal saline (0.02 mg/mL) infusion  0.5 mg/hr Intracatheter Continuous Coral Timme A, PA-C      . ceFAZolin (ANCEF) IVPB 2g/100 mL premix  2 g Intravenous Once Ramsha Lonigro A, PA-C      . diphenhydrAMINE (BENADRYL) injection 50 mg  50 mg Intravenous Once PRN Elfriede Bonini A, PA-C      . famotidine (PEPCID) tablet 40 mg  40 mg Oral Once PRN Telecia Larocque A, PA-C      . gabapentin (NEURONTIN) capsule 300 mg  300 mg  Oral QHS Emiley Digiacomo A, PA-C      . heparin ADULT infusion 100 units/mL (25000 units/257mL sodium chloride 0.45%)  600 Units/hr Intravenous Continuous Natarsha Hurwitz A, PA-C      . hydrALAZINE (APRESOLINE) injection 5 mg  5 mg Intravenous Q20 Min PRN Danzell Birky A, PA-C      . labetalol (NORMODYNE) injection 10 mg  10 mg Intravenous Q10 min PRN Kinsey Cowsert A, PA-C      . lisinopril (ZESTRIL) tablet 10 mg  10 mg Oral Daily Alessander Sikorski A, PA-C      . methylPREDNISolone sodium succinate (SOLU-MEDROL) 125 mg/2 mL injection 125 mg  125 mg Intravenous Once PRN  Zaniyah Wernette A, PA-C      . metoprolol tartrate (LOPRESSOR) injection 2-5 mg  2-5 mg Intravenous Q2H PRN Trevian Hayashida A, PA-C      . metoprolol tartrate (LOPRESSOR) injection 5 mg  5 mg Intravenous Q6H Lauria Depoy A, PA-C      . midazolam (VERSED) 2 MG/ML syrup 8 mg  8 mg Oral Once PRN Atavia Poppe A, PA-C      . midazolam (VERSED) injection 1 mg  1 mg Intravenous Q1H PRN Monty Spicher A, PA-C      . morphine 4 MG/ML injection 5 mg  5 mg Intravenous Q1H PRN Maggi Hershkowitz A, PA-C      . ondansetron (ZOFRAN) injection 4 mg  4 mg Intravenous Q6H PRN Esty Ahuja A, PA-C      . ondansetron (ZOFRAN) injection 4 mg  4 mg Intravenous Q6H PRN Stryker Veasey A, PA-C      . simvastatin (ZOCOR) tablet 20 mg  20 mg Oral q1800 Alissandra Geoffroy A, PA-C      . sodium chloride flush (NS) 0.9 % injection 3 mL  3 mL Intravenous Q12H Ahni Bradwell A, PA-C      . sodium chloride flush (NS) 0.9 % injection 3 mL  3 mL Intravenous PRN Shylie Polo, Janalyn Harder, PA-C       Past Medical History:  Diagnosis Date  . Allergy   . Dyspnea    former smoker. only doe  . Peripheral vascular disease [facility])    Past Surgical History:  Procedure Laterality Date  . BACK SURGERY  2011  . CENTRAL LINE INSERTION Right 09/10/2016   Procedure: CENTRAL LINE INSERTION;  Surgeon: Katha Cabal, MD;  Location: [facility] ORS;  Service: Vascular;  Laterality: Right;  . COLONOSCOPY  2013 ?  . FEMORAL-POPLITEAL BYPASS GRAFT Right 09/10/2016   Procedure: BYPASS GRAFT FEMORAL-POPLITEAL ARTERY ( BELOW KNEE );  Surgeon: Katha Cabal, MD;  Location: [facility];  Service: Vascular;  Laterality: Right;  . LOWER EXTREMITY ANGIOGRAPHY Right 11/18/2016   Procedure: Lower Extremity Angiography;  Surgeon: Katha Cabal, MD;  Location: [place] [facility];  Service: Cardiovascular;  Laterality: Right;  . LOWER EXTREMITY ANGIOGRAPHY Right 12/09/2016   Procedure: Lower Extremity Angiography;   Surgeon: Katha Cabal, MD;  Location: [place] [facility];  Service: Cardiovascular;  Laterality: Right;  . LOWER EXTREMITY ANGIOGRAPHY Right 11/15/2019   Procedure: LOWER EXTREMITY ANGIOGRAPHY;  Surgeon: Katha Cabal, MD;  Location: [place] [facility];  Service: Cardiovascular;  Laterality: Right;  . LOWER EXTREMITY INTERVENTION  11/18/2016   Procedure: Lower Extremity Intervention;  Surgeon: Katha Cabal, MD;  Location: [place] [facility];  Service: Cardiovascular;;  . PERIPHERAL VASCULAR CATHETERIZATION Right 01/02/2015   Procedure: Lower Extremity Angiography;  Surgeon: Katha Cabal, MD;  Location: [place] [facility];  Service: Cardiovascular;  Laterality: Right;  . PERIPHERAL VASCULAR CATHETERIZATION Right 06/18/2015   Procedure: Lower Extremity Angiography;  Surgeon: Algernon Huxley, MD;  Location: [place] [facility];  Service: Cardiovascular;  Laterality: Right;  . PERIPHERAL VASCULAR CATHETERIZATION  06/18/2015   Procedure: Lower Extremity Intervention;  Surgeon: Algernon Huxley, MD;  Location: [place] [facility];  Service: Cardiovascular;;  . PERIPHERAL VASCULAR CATHETERIZATION N/A 10/09/2015   Procedure: Abdominal Aortogram w/Lower Extremity;  Surgeon: Katha Cabal, MD;  Location: [place] [facility];  Service: Cardiovascular;  Laterality: N/A;  . PERIPHERAL VASCULAR CATHETERIZATION  10/09/2015   Procedure: Lower Extremity Intervention;  Surgeon: Katha Cabal, MD;  Location: [place] [facility];  Service: Cardiovascular;;  . PERIPHERAL VASCULAR CATHETERIZATION Right 10/10/2015   Procedure: Lower Extremity Angiography;  Surgeon: Algernon Huxley, MD;  Location: [place] [facility];  Service: Cardiovascular;  Laterality: Right;  . PERIPHERAL VASCULAR CATHETERIZATION Right 10/10/2015   Procedure: Lower Extremity Intervention;  Surgeon: Algernon Huxley, MD;  Location: [place] [facility];  Service: Cardiovascular;  Laterality: Right;  . PERIPHERAL VASCULAR  CATHETERIZATION Right 12/03/2015   Procedure: Lower Extremity Angiography;  Surgeon: Algernon Huxley, MD;  Location: [place] [facility];  Service: Cardiovascular;  Laterality: Right;  . PERIPHERAL VASCULAR CATHETERIZATION Right 12/04/2015   Procedure: Lower Extremity Angiography;  Surgeon: Algernon Huxley, MD;  Location: [place] [facility];  Service: Cardiovascular;  Laterality: Right;  . PERIPHERAL VASCULAR CATHETERIZATION  12/04/2015   Procedure: Lower Extremity Intervention;  Surgeon: Algernon Huxley, MD;  Location: [place] [facility];  Service: Cardiovascular;;  . PERIPHERAL VASCULAR CATHETERIZATION Right 06/30/2016   Procedure: Lower Extremity Angiography;  Surgeon: Algernon Huxley, MD;  Location: [place] [facility];  Service: Cardiovascular;  Laterality: Right;  . PERIPHERAL VASCULAR CATHETERIZATION Right 06/25/2016   Procedure: Lower Extremity Angiography;  Surgeon: Katha Cabal, MD;  Location: [place] [facility];  Service: Cardiovascular;  Laterality: Right;  . PERIPHERAL VASCULAR CATHETERIZATION Right 07/01/2016   Procedure: Lower Extremity Angiography;  Surgeon: Katha Cabal, MD;  Location: [place] [facility];  Service: Cardiovascular;  Laterality: Right;  . PERIPHERAL VASCULAR CATHETERIZATION  07/01/2016   Procedure: Lower Extremity Intervention;  Surgeon: Katha Cabal, MD;  Location: [place] [facility];  Service: Cardiovascular;;  . PERIPHERAL VASCULAR CATHETERIZATION Right 08/01/2016   Procedure: Lower Extremity Angiography;  Surgeon: Katha Cabal, MD;  Location: [place] [facility];  Service: Cardiovascular;  Laterality: Right;  . stent placement in right leg Right    Social History   Tobacco Use  . Smoking status: Former Smoker    Packs/day: 1.50    Years: 38.00    Pack years: 57.00    Types: Cigarettes    Quit date: 12/20/2014    Years since quitting: 4.9  . Smokeless tobacco: Former Network engineer Use Topics  . Alcohol use: Not Currently  . Drug use: No    Family History  Problem Relation Age of Onset  . Diabetes Mother   . Hypertension Mother   . Heart murmur Mother   . Colon cancer Neg Hx   . Breast cancer Neg Hx   Denies family history of peripheral artery disease, venous disease or renal disease.  No Known Allergies  REVIEW OF SYSTEMS (Negative unless checked)  Constitutional: [] Weight loss  [] Fever  [] Chills Cardiac: [] Chest pain   [] Chest pressure   [] Palpitations   [] Shortness of  breath when laying flat   [] Shortness of breath at rest   [] Shortness of breath with exertion. Vascular:  [x] Pain in legs with walking   [x] Pain in legs at rest   [x] Pain in legs when laying flat   [x] Claudication   [] Pain in feet when walking  [] Pain in feet at rest  [] Pain in feet when laying flat   [] History of DVT   [] Phlebitis   [] Swelling in legs   [] Varicose veins   [] Non-healing ulcers Pulmonary:   [] Uses home oxygen   [] Productive cough   [] Hemoptysis   [] Wheeze  [] COPD   [] Asthma Neurologic:  [] Dizziness  [] Blackouts   [] Seizures   [] History of stroke   [] History of TIA  [] Aphasia   [] Temporary blindness   [] Dysphagia   [] Weakness or numbness in arms   [] Weakness or numbness in legs Musculoskeletal:  [] Arthritis   [] Joint swelling   [] Joint pain   [] Low back pain Hematologic:  [] Easy bruising  [] Easy bleeding   [] Hypercoagulable state   [] Anemic  [] Hepatitis Gastrointestinal:  [] Blood in stool   [] Vomiting blood  [] Gastroesophageal reflux/heartburn   [] Difficulty swallowing. Genitourinary:  [] Chronic kidney disease   [] Difficult urination  [] Frequent urination  [] Burning with urination   [] Blood in urine Skin:  [] Rashes   [] Ulcers   [] Wounds Psychological:  [] History of anxiety   []  History of major depression.  Physical Examination  Vitals:   12/01/19 1638  BP: (!) 169/83  Pulse: 79  Resp: 20  Temp: 98.4 F (36.9 C)  TempSrc: Oral  SpO2: 98%  Weight: 94.3 kg  Height: 6' (1.829 m)   Body mass index is 28.21 kg/m. Gen: WD/WN,  NAD Head: [place]/AT, No temporalis wasting. Prominent temp pulse not noted. Ear/Nose/Throat: Hearing grossly intact, nares w/o erythema or drainage, oropharynx w/o Erythema/Exudate,  Eyes: Conjunctiva clear, sclera non-icteric Neck: Trachea midline.  No JVD.  Pulmonary:  Good air movement, respirations not labored, no use of accessory muscles.  Cardiac: RRR, normal S1, S2. Vascular:  Vessel Right Left  Radial Palpable Palpable                          PT Non-Palpable Palpable  DP Non-Palpable Palpable   Gastrointestinal: soft, non-tender/non-distended. No guarding/reflex.  Musculoskeletal: M/S 5/5 throughout.  Extremities without ischemic changes.  No deformity or atrophy.  Neurologic: Sensation grossly intact in extremities.  Symmetrical.  Speech is fluent. Motor exam as listed above. Psychiatric: Judgment intact, Mood & affect appropriate for pt's clinical situation. Dermatologic: No rashes or ulcers noted.  No cellulitis or open wounds. Lymph : No Cervical, Axillary, or Inguinal lymphadenopathy.  CBC Lab Results  Component Value Date   WBC 5.0 11/16/2019   HGB 11.3 (L) 11/16/2019   HCT 34.9 (L) 11/16/2019   MCV 81.9 11/16/2019   PLT 217 11/16/2019   BMET    Component Value Date/Time   NA 140 11/16/2019 0521   NA 141 10/14/2019 1101   K 4.0 11/16/2019 0521   CL 106 11/16/2019 0521   CO2 27 11/16/2019 0521   GLUCOSE 113 (H) 11/16/2019 0521   BUN 16 11/16/2019 0521   BUN 15 10/14/2019 1101   BUN 16 07/03/2014 1205   CREATININE 0.86 11/16/2019 0521   CREATININE 1.34 (H) 07/03/2014 1205   CALCIUM 8.5 (L) 11/16/2019 0521   GFRNONAA >60 11/16/2019 0521   GFRNONAA 59 (L) 07/03/2014 1205   GFRAA >60 11/16/2019 0521   GFRAA >60 07/03/2014 1205  Estimated Creatinine Clearance: 107.6 mL/min (by C-G formula based on SCr of 0.86 mg/dL).  COAG Lab Results  Component Value Date   INR 1.11 06/25/2016   INR 1.90 05/12/2016   INR 1.20 12/05/2015   Radiology PERIPHERAL  VASCULAR CATHETERIZATION  Result Date: 11/15/2019 See Op Note  VAS [place] ABI WITH/WO TBI  Result Date: 12/01/2019 LOWER EXTREMITY DOPPLER STUDY Indications: Routine f/u. Other Factors: ASO s/p multiple interventions and bypass.                11/15/2019 Right thrombectomy of Fempop insitu graft with added                stent throughout.  Vascular Interventions: Hx of multiple right lower extremity PTA's/stents.                         09/10/2016 Right femoral to below the knee popliteal                         artery bypass graft placement. 5/01 Right bypass graft &                         mid popliteal artery PTA & coil embolectomy of large                         branch of bypass graft. 12/09/16 Right distal bypass                         graft PTA/stent and right posterior tibial artery PTA. Comparison Study: 11/08/2019 Performing Technologist: Concha Norway RVT  Examination Guidelines: A complete evaluation includes at minimum, Doppler waveform signals and systolic blood pressure reading at the level of bilateral brachial, anterior tibial, and posterior tibial arteries, when vessel segments are accessible. Bilateral testing is considered an integral part of a complete examination. Photoelectric Plethysmograph (PPG) waveforms and toe systolic pressure readings are included as required and additional duplex testing as needed. Limited examinations for reoccurring indications may be performed as noted.  ABI Findings: +---------+------------------+-----+--------+--------+ Right    Rt Pressure (mmHg)IndexWaveformComment  +---------+------------------+-----+--------+--------+ Brachial 138                                     +---------+------------------+-----+--------+--------+ ATA      69                0.50                  +---------+------------------+-----+--------+--------+ PTA      65                0.47                  +---------+------------------+-----+--------+--------+ Great Toe                        Absent           +---------+------------------+-----+--------+--------+ +---------+------------------+-----+--------+-------+ Left     Lt Pressure (mmHg)IndexWaveformComment +---------+------------------+-----+--------+-------+ Brachial 138                                    +---------+------------------+-----+--------+-------+ ATA      171  1.24                 +---------+------------------+-----+--------+-------+ PTA      156               1.13                 +---------+------------------+-----+--------+-------+ Great Toe110               0.80                 +---------+------------------+-----+--------+-------+ +-------+-----------+-----------+------------+------------+ ABI/TBIToday's ABIToday's TBIPrevious ABIPrevious TBI +-------+-----------+-----------+------------+------------+ Right  .50        absent     .63         .35          +-------+-----------+-----------+------------+------------+ Left   1.24       .80        1.21        .95          +-------+-----------+-----------+------------+------------+ Right ABIs appear decreased compared to prior study on 11/08/2019.  Summary: Right: Resting right ankle-brachial index indicates moderate right lower extremity arterial disease. The right toe-brachial index is abnormal. Duplex images added due to findings. The right FemPop BPG with new stents is totally occluded s/p intervention. Left: Resting left ankle-brachial index is within normal range. No evidence of significant left lower extremity arterial disease. The left toe-brachial index is normal.  *See table(s) above for measurements and observations.  Electronically signed by [name] MD on 12/01/2019 at 9:10:45 AM.    Final    VAS [place] ABI WITH/WO TBI  Result Date: 11/10/2019 LOWER EXTREMITY DOPPLER STUDY Indications: Routine f/u. Other Factors: ASO s/p multiple interventions and bypass.  Vascular Interventions: Hx of  multiple right lower extremity PTA's/stents.                         09/10/2016 Right femoral to below the knee popliteal                         artery bypass graft placement. 5/01 Right bypass graft &                         mid popliteal artery PTA & coil embolectomy of large                         branch of bypass graft. 12/09/16 Right distal bypass                         graft PTA/stent and right posterior tibial artery PTA. Comparison Study: 10/06/2018 Performing Technologist: Charlane Ferretti RT (R)(VS)  Examination Guidelines: A complete evaluation includes at minimum, Doppler waveform signals and systolic blood pressure reading at the level of bilateral brachial, anterior tibial, and posterior tibial arteries, when vessel segments are accessible. Bilateral testing is considered an integral part of a complete examination. Photoelectric Plethysmograph (PPG) waveforms and toe systolic pressure readings are included as required and additional duplex testing as needed. Limited examinations for reoccurring indications may be performed as noted.  ABI Findings: +---------+------------------+-----+----------+--------+ Right    Rt Pressure (mmHg)IndexWaveform  Comment  +---------+------------------+-----+----------+--------+ Brachial 143                                       +---------+------------------+-----+----------+--------+  ATA      78                0.54 monophasic         +---------+------------------+-----+----------+--------+ PTA      91                0.63 monophasic         +---------+------------------+-----+----------+--------+ Great Toe50                0.35 Abnormal           +---------+------------------+-----+----------+--------+ +---------+------------------+-----+---------+-------+ Left     Lt Pressure (mmHg)IndexWaveform Comment +---------+------------------+-----+---------+-------+ Brachial 144                                      +---------+------------------+-----+---------+-------+ ATA      174               1.21 triphasic        +---------+------------------+-----+---------+-------+ PTA      164               1.14 triphasic        +---------+------------------+-----+---------+-------+ [place]               0.95 Normal           +---------+------------------+-----+---------+-------+ +-------+-----------+-----------+------------+------------+ ABI/TBIToday's ABIToday's TBIPrevious ABIPrevious TBI +-------+-----------+-----------+------------+------------+ Right  .63        .35        1.07        .54          +-------+-----------+-----------+------------+------------+ Left   1.21       .95        1.24        .82          +-------+-----------+-----------+------------+------------+ Right ABIs appear decreased compared to prior study on 10/06/2019. Left ABIs appear essentially unchanged compared to prior study on 10/06/2019. Bilateral TBI's appear essentially unchanged from the previous exam on 10/06/2019.  Summary: Right: Resting right ankle-brachial index indicates moderate right lower extremity arterial disease. The right toe-brachial index is abnormal. Left: Resting left ankle-brachial index is within normal range. No evidence of significant left lower extremity arterial disease. The left toe-brachial index is normal. *See table(s) above for measurements and observations.  Electronically signed by [name] MD on 11/10/2019 at 9:07:11 AM.    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  Patient has undergone multiple endovascular and open interventions to the right lower extremity in the setting of ischemia.  Patient presents with progressively worsening claudication transitioning to rest pain.  He presents today for scheduled right lower extremity angiogram.  1. Occlusion of right femoropopliteal bypass  graft, subsequent encounter:  The patient has evidence of severe atherosclerotic changes of the right lower extremities with rest pain that is associated with preulcerative changes and impending tissue loss of the foot.  This represents a limb threatening ischemia and places the patient at the risk for limb loss.  Patient should undergo angiography of the lower extremities with the hope for intervention for limb salvage.  The risks and benefits as well as the alternative therapies was discussed in detail with the patient.  All questions were answered.  Patient agrees to proceed with angiography.  2. Status post angioplasty with stent Post angioplasty if the patient stent and bypass graft  have reoccluded.  Patient will proceed with angiogram as outlined above.  Patient will continue with anticoagulation.  3. Mixed hyperlipidemia Continue statin as ordered and reviewed, no changes at this time  4. Essential hypertension Continue antihypertensive medications as already ordered, these medications have been reviewed and there are no changes at this time.  Discussed with Dew / Schnier  Sela Hua, PA-C  12/01/2019 4:52 PM

## 2019-12-01 NOTE — Consult Note (Addendum)
ANTICOAGULATION CONSULT NOTE - Follow Up Consult  Pharmacy Consult for Heparin infusion Indication:  Limb ischemia / VTE treatment  No Known Allergies  Patient Measurements: Height: 6' (182.9 cm) Weight: 94.3 kg (208 lb) IBW/kg (Calculated) : 77.6 Heparin Dosing Weight: 94.3 kg  Vital Signs: Temp: 98.4 F (36.9 C) (05/13 1638) Temp Source: Oral (05/13 1638) BP: 97/46 (05/13 1850) Pulse Rate: 72 (05/13 1850)  Labs: Recent Labs    12/01/19 1645  CREATININE 1.13    Estimated Creatinine Clearance: 81.9 mL/min (by C-G formula based on SCr of 1.13 mg/dL).   Medications:  Apixaban 5 mg BID (last dose not documented in chart)  Assessment: Patient is a 64 y/o M with medical history significant for PVD s/p multiple endovascular and open interventions to the right lower extremity who is admitted for scheduled procedure. Patient with occlusion of recent right femoral to popliteal bypass stents and received catheter directed thrombolysis with 6 mg TPA and further placement of lysis catheter for continuous thrombolytic therapy overnight. Pharmacy has been consulted to help manage heparin infusion.  Unable to obtain baseline labs as patient was in procedure.  Will follow aPTT in view of likely interference of apixaban on heparin level.  Goal of Therapy:  aPTT 50-75 seconds or roughly 1.5 - 2x control Monitor platelets by anticoagulation protocol: Yes   Plan:  -Initiate heparin at 600 units/hr, no bolus -aPTT on 5/14 @ 0100 -Daily CBC per protocol (currently ordered q6h x 4 following procedure)  [place] Resident 12/01/2019,7:12 PM

## 2019-12-01 NOTE — Progress Notes (Signed)
eLink Physician-Brief Progress Note Patient Name: Joshua [name] DOB: Jan 24, 1956 MRN: [id number]   Date of Service  12/01/2019  HPI/Events of Note  65 M PAOD s/p femp-pop bypass, s/p thrombectomy, angioplasty and stent placement (4/27) presetns with acute on chronic limb ischemia with re-occlusion now on continuous thrombolytic therapy with alteplase and heparin  Patient seen comfortable BP 102/61  HR 73  O2 96%  eICU Interventions  Monitor for bleed BP control     Intervention Category Major Interventions: Other: Evaluation Type: New Patient Evaluation  Judd Lien 12/01/2019, 9:02 PM

## 2019-12-01 NOTE — Progress Notes (Signed)
Subjective:    Patient ID: Joshua [name], male    DOB: 08-18-1955, 64 y.o.   MRN: [id number] Chief Complaint  Patient presents with  . Follow-up    [facility] 54month post angio    The patient returns to the office for followup and review of the noninvasive studies. There has been a significant deterioration in the lower extremity symptoms.  The patient notes interval shortening of their claudication distance and development of mild rest pain symptoms. No new ulcers or wounds have occurred since the last visit.  The patient underwent intervention to his right lower extremity on 11/15/2019.  He had extensive intervention including:  PROCEDURE: 1. Right lower extremity angiography third order catheter placement 2. Ultrasound-guided access left common femoral artery for sheath placement 3. Mechanical thrombectomy of the femoral to popliteal vein bypass with both the Rota Rex device as well as the CAT 7. 4. Infusion of 8 mg of TPA with a 30-minute dwell time. 5. Percutaneous transluminal angioplasty and stent placement the entire length of the right femoral to to below-knee popliteal bypass graft. 6. Percutaneous transluminal angioplasty of the right peroneal artery to 4 mm with a Lutonix drug-eluting balloon  The patient states that his leg felt much better following the procedure however approximately 4 to 5 days after until he suddenly had pain again.  The patient also notes at this time his toes ache and throb as well.  He denies any new ulceration formation or discoloration of his toes.  There have been no significant changes to the patient's overall health care.  The patient denies amaurosis fugax or recent TIA symptoms. There are no recent neurological changes noted. The patient denies history of DVT, PE or superficial thrombophlebitis. The patient denies recent episodes of angina or shortness of breath.   ABI's Rt=0.50 and Lt=1.24 (previous ABI's Rt=0.63 and Lt=1.21) Duplex US of the  lower extremity arterial system shows triphasic waveforms in the left tibial arteries.  A limited duplex was done to the right lower extremity and the right femoropopliteal bypass graft with new stents is totally occluded.  There is absent flow in the toes.   Review of Systems  Musculoskeletal:       Throbbing pain in feet  Neurological: Positive for weakness.  All other systems reviewed and are negative.      Objective:   Physical Exam Vitals reviewed.  Constitutional:      Appearance: Normal appearance.  Cardiovascular:     Pulses:          Dorsalis pedis pulses are 0 on the right side and 1+ on the left side.       Posterior tibial pulses are 0 on the right side and 1+ on the left side.  Pulmonary:     Effort: Pulmonary effort is normal.     Breath sounds: Normal breath sounds.  Skin:    Comments: Right toes cool with pallor  Neurological:     Mental Status: He is alert and oriented to person, place, and time.  Psychiatric:        Mood and Affect: Mood normal.        Behavior: Behavior normal.        Thought Content: Thought content normal.        Judgment: Judgment normal.     BP 139/73 (BP Location: Right Arm)   Pulse 78   Resp 16   Wt 208 lb 9.6 oz (94.6 kg)   BMI 28.29 kg/m  Past Medical History:  Diagnosis Date  . Allergy   . Dyspnea    former smoker. only doe  . Peripheral vascular disease ([place])     Social History   Socioeconomic History  . Marital status: Single    Spouse name: Not on file  . Number of children: Not on file  . Years of education: Not on file  . Highest education level: Not on file  Occupational History  . Occupation: unemployed  Tobacco Use  . Smoking status: Former Smoker    Packs/day: 1.50    Years: 38.00    Pack years: 57.00    Types: Cigarettes    Quit date: 12/20/2014    Years since quitting: 4.9  . Smokeless tobacco: Former Network engineer and Sexual Activity  . Alcohol use: Not Currently  . Drug use: No  . Sexual  activity: Yes  Other Topics Concern  . Not on file  Social History Narrative  . Not on file   Social Determinants of Health   Financial Resource Strain:   . Difficulty of Paying Living Expenses:   Food Insecurity:   . Worried About Charity fundraiser in the Last Year:   . Arboriculturist in the Last Year:   Transportation Needs:   . Film/video editor (Medical):   Marland Kitchen Lack of Transportation (Non-Medical):   Physical Activity:   . Days of Exercise per Week:   . Minutes of Exercise per Session:   Stress:   . Feeling of Stress :   Social Connections:   . Frequency of Communication with Friends and Family:   . Frequency of Social Gatherings with Friends and Family:   . Attends Religious Services:   . Active Member of Clubs or Organizations:   . Attends Archivist Meetings:   Marland Kitchen Marital Status:   Intimate Partner Violence:   . Fear of Current or Ex-Partner:   . Emotionally Abused:   Marland Kitchen Physically Abused:   . Sexually Abused:     Past Surgical History:  Procedure Laterality Date  . BACK SURGERY  2011  . CENTRAL LINE INSERTION Right 09/10/2016   Procedure: CENTRAL LINE INSERTION;  Surgeon: Katha Cabal, MD;  Location: [facility];  Service: Vascular;  Laterality: Right;  . COLONOSCOPY  2013 ?  . FEMORAL-POPLITEAL BYPASS GRAFT Right 09/10/2016   Procedure: BYPASS GRAFT FEMORAL-POPLITEAL ARTERY ( BELOW KNEE );  Surgeon: Katha Cabal, MD;  Location: [facility];  Service: Vascular;  Laterality: Right;  . LOWER EXTREMITY ANGIOGRAPHY Right 11/18/2016   Procedure: Lower Extremity Angiography;  Surgeon: Katha Cabal, MD;  Location: [place] [facility];  Service: Cardiovascular;  Laterality: Right;  . LOWER EXTREMITY ANGIOGRAPHY Right 12/09/2016   Procedure: Lower Extremity Angiography;  Surgeon: Katha Cabal, MD;  Location: [place] [facility];  Service: Cardiovascular;  Laterality: Right;  . LOWER EXTREMITY ANGIOGRAPHY Right 11/15/2019   Procedure: LOWER  EXTREMITY ANGIOGRAPHY;  Surgeon: Katha Cabal, MD;  Location: [place] [facility];  Service: Cardiovascular;  Laterality: Right;  . LOWER EXTREMITY INTERVENTION  11/18/2016   Procedure: Lower Extremity Intervention;  Surgeon: Katha Cabal, MD;  Location: [place] [facility];  Service: Cardiovascular;;  . PERIPHERAL VASCULAR CATHETERIZATION Right 01/02/2015   Procedure: Lower Extremity Angiography;  Surgeon: Katha Cabal, MD;  Location: [place] [facility];  Service: Cardiovascular;  Laterality: Right;  . PERIPHERAL VASCULAR CATHETERIZATION Right 06/18/2015   Procedure: Lower Extremity Angiography;  Surgeon: Corene Cornea  Bunnie Domino, MD;  Location: [place] [facility];  Service: Cardiovascular;  Laterality: Right;  . PERIPHERAL VASCULAR CATHETERIZATION  06/18/2015   Procedure: Lower Extremity Intervention;  Surgeon: Algernon Huxley, MD;  Location: [place] [facility];  Service: Cardiovascular;;  . PERIPHERAL VASCULAR CATHETERIZATION N/A 10/09/2015   Procedure: Abdominal Aortogram w/Lower Extremity;  Surgeon: Katha Cabal, MD;  Location: [place] [facility];  Service: Cardiovascular;  Laterality: N/A;  . PERIPHERAL VASCULAR CATHETERIZATION  10/09/2015   Procedure: Lower Extremity Intervention;  Surgeon: Katha Cabal, MD;  Location: [place] [facility];  Service: Cardiovascular;;  . PERIPHERAL VASCULAR CATHETERIZATION Right 10/10/2015   Procedure: Lower Extremity Angiography;  Surgeon: Algernon Huxley, MD;  Location: [place] [facility];  Service: Cardiovascular;  Laterality: Right;  . PERIPHERAL VASCULAR CATHETERIZATION Right 10/10/2015   Procedure: Lower Extremity Intervention;  Surgeon: Algernon Huxley, MD;  Location: [place] [facility];  Service: Cardiovascular;  Laterality: Right;  . PERIPHERAL VASCULAR CATHETERIZATION Right 12/03/2015   Procedure: Lower Extremity Angiography;  Surgeon: Algernon Huxley, MD;  Location: [place] [facility];  Service: Cardiovascular;  Laterality: Right;  .  PERIPHERAL VASCULAR CATHETERIZATION Right 12/04/2015   Procedure: Lower Extremity Angiography;  Surgeon: Algernon Huxley, MD;  Location: [place] [facility];  Service: Cardiovascular;  Laterality: Right;  . PERIPHERAL VASCULAR CATHETERIZATION  12/04/2015   Procedure: Lower Extremity Intervention;  Surgeon: Algernon Huxley, MD;  Location: [place] [facility];  Service: Cardiovascular;;  . PERIPHERAL VASCULAR CATHETERIZATION Right 06/30/2016   Procedure: Lower Extremity Angiography;  Surgeon: Algernon Huxley, MD;  Location: [place] [facility];  Service: Cardiovascular;  Laterality: Right;  . PERIPHERAL VASCULAR CATHETERIZATION Right 06/25/2016   Procedure: Lower Extremity Angiography;  Surgeon: Katha Cabal, MD;  Location: [place] [facility];  Service: Cardiovascular;  Laterality: Right;  . PERIPHERAL VASCULAR CATHETERIZATION Right 07/01/2016   Procedure: Lower Extremity Angiography;  Surgeon: Katha Cabal, MD;  Location: [place] [facility];  Service: Cardiovascular;  Laterality: Right;  . PERIPHERAL VASCULAR CATHETERIZATION  07/01/2016   Procedure: Lower Extremity Intervention;  Surgeon: Katha Cabal, MD;  Location: [place] [facility];  Service: Cardiovascular;;  . PERIPHERAL VASCULAR CATHETERIZATION Right 08/01/2016   Procedure: Lower Extremity Angiography;  Surgeon: Katha Cabal, MD;  Location: [place] [facility];  Service: Cardiovascular;  Laterality: Right;  . stent placement in right leg Right     Family History  Problem Relation Age of Onset  . Diabetes Mother   . Hypertension Mother   . Heart murmur Mother   . Colon cancer Neg Hx   . Breast cancer Neg Hx     No Known Allergies     Assessment & Plan:   1. Occlusion of right femoropopliteal bypass graft, subsequent encounter Recommend:  The patient has evidence of severe atherosclerotic changes of the right lower extremities with rest pain that is associated with preulcerative changes and impending tissue loss of  the foot.  This represents a limb threatening ischemia and places the patient at the risk for limb loss.  Patient should undergo angiography of the lower extremities with the hope for intervention for limb salvage.  The risks and benefits as well as the alternative therapies was discussed in detail with the patient.  All questions were answered.  Patient agrees to proceed with angiography.  The patient will follow up with me in the office after the procedure.    - VAS [place] ABI WITH/WO  TBI  2. Status post angioplasty with stent Post angioplasty if the patient stent and bypass graft have reoccluded.  Patient will proceed with angiogram as outlined above.  Patient will continue with anticoagulation. - VAS [place] ABI WITH/WO TBI  3. Mixed hyperlipidemia Continue statin as ordered and reviewed, no changes at this time   4. Essential hypertension Continue antihypertensive medications as already ordered, these medications have been reviewed and there are no changes at this time.    Current Outpatient Medications on File Prior to Visit  Medication Sig Dispense Refill  . acetaminophen (TYLENOL) 650 MG CR tablet Take 1,300 mg by mouth daily as needed (headache/pain).     Marland Kitchen apixaban (ELIQUIS) 5 MG TABS tablet Take 1 tablet (5 mg total) by mouth 2 (two) times daily. 60 tablet 5  . gabapentin (NEURONTIN) 300 MG capsule Take 1 capsule (300 mg total) by mouth at bedtime. 90 capsule 1  . lisinopril (ZESTRIL) 10 MG tablet Take 1 tablet (10 mg total) by mouth daily. 90 tablet 1  . simvastatin (ZOCOR) 20 MG tablet Take 1 tablet (20 mg total) by mouth daily at 6 PM. 90 tablet 1   No current facility-administered medications on file prior to visit.    There are no Patient Instructions on file for this visit. No follow-ups on file.   Kris Hartmann, NP

## 2019-12-01 NOTE — Progress Notes (Signed)
Patient clinically stable post lysis catheter placement per Dr Lucky Cowboy. No bleeding nor hematoma at left groin site, TPA started to arterial sheath per orders as well as heparin gtt per orders through side port, denies complaints at this time. Vitals stable. Report called to [facility] RN/ccu with plan reviewed, sinus rhythm per monitor.

## 2019-12-01 NOTE — H&P (Signed)
 [facility] History & Physical Update  The patient was interviewed and re-examined.  The patient's previous History and Physical has been reviewed and is unchanged.  There is no change in the plan of care. We plan to proceed with the scheduled procedure.  Leotis Pain, MD  12/01/2019, 4:39 PM

## 2019-12-01 NOTE — Op Note (Signed)
[place] [facility]  Percutaneous Study/Intervention Procedural Note   Date of Surgery: 12/01/2019  Surgeon(s):Symphani Eckstrom    Assistants:none  Pre-operative Diagnosis: PAD with rest pain right lower extremity, acute on chronic ischemia  Post-operative diagnosis:  Same  Procedure(s) Performed:             1.  Ultrasound guidance for vascular access left femoral artery             2.  Catheter placement into right anterior tibial artery from left femoral approach             3.  Aortogram and selective right lower extremity angiogram             4.   6 mg of TPA delivered in a lysis catheter from the origin of the right femoral-popliteal bypass down the proximal anterior tibial artery with placement of the lysis catheter for continuous thrombolytic therapy overnight             5.   Ultrasound guidance for vascular access left femoral vein  6.  Triple-lumen catheter placement left femoral vein         EBL: 10 cc  Contrast: 35 cc  Fluoro Time: 19.3 minutes  Moderate Conscious Sedation Time: approximately 60 minutes using 4 mg of Versed and 100 mcg of Fentanyl              Indications:  Patient is a 64 y.o.male with ischemic rest pain in the right lower extremity with acute on chronic ischemia and occlusion of his recent intervention which was on his right femoral to popliteal bypass. The patient has noninvasive study showing occlusion of the bypass with very poor flow distally. The patient is brought in for angiography for further evaluation and potential treatment.  Due to the limb threatening nature of the situation, angiogram was performed for attempted limb salvage. The patient is aware that if the procedure fails, amputation would be expected.  The patient also understands that even with successful revascularization, amputation may still be required due to the severity of the situation.  Risks and benefits are discussed and informed consent is obtained.   Procedure:   The patient was identified and appropriate procedural time out was performed.  The patient was then placed supine on the table and prepped and draped in the usual sterile fashion. Moderate conscious sedation was administered during a face to face encounter with the patient throughout the procedure with my supervision of the RN administering medicines and monitoring the patient's vital signs, pulse oximetry, telemetry and mental status throughout from the start of the procedure until the patient was taken to the recovery room. Ultrasound was used to evaluate the left common femoral artery.  It was patent .  A digital ultrasound image was acquired.  A Seldinger needle was used to access the left common femoral artery under direct ultrasound guidance and a permanent image was performed.  A 0.035 J wire was advanced without resistance and a 5Fr sheath was placed.  Pigtail catheter was placed into the aorta and an AP aortogram was performed. This demonstrated normal renal arteries and normal aorta and iliac segments without significant stenosis. I then crossed the aortic bifurcation and advanced to the right femoral head. Selective right lower extremity angiogram was then performed. 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  The profunda femoris artery was patent.  There was occlusion of the SFA and the previous surgical bypass as well as the stents in the bypass at its origin.  Distal reconstitution was very poor.  After crossing the occlusion getting a catheter down into the anterior tibial artery, selective imaging showed a heavily diseased anterior tibial artery which was not continuous to the foot. It was felt that it was in the patient's best interest to proceed with intervention after these images to avoid a second procedure and a larger amount of contrast and fluoroscopy based off of the findings from the initial  angiogram. The patient was systemically heparinized and a 7 [place] Ansell sheath was then placed over the [employer] wire. 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  Ultimately, we exchanged for a CXI catheter and a 0.018 advantage wire and was able to get into the bypass graft and advanced distally.  The catheter was advanced into the mid bypass graft where imaging was performed showing complete thrombosis with no flow distally.  Catheter was then advanced over a Magic torque wire down into the tibial vessels.  The anterior tibial artery was cannulated and imaging showed a very diseased anterior tibial artery was not continuous to the foot.  It was clear that the only option for salvage would be a continuous infusion of thrombolytic therapy.  A 130 cm total length 50 cm working length thrombolytic catheter was then placed over the Magic torque wire with the distal tip parked at the origin of the anterior tibial artery in the proximal portion at the origin of the femoral to popliteal bypass.  6 mg of TPA was then instilled through the catheter and a continuous infusion of TPA will be administered overnight.  To ensure durable venous access in this limb threatening situation, a central line was placed.  The left femoral vein was visualized with ultrasound and found to be widely patent.  Was then accessed under direct ultrasound guidance without difficulty with a Seldinger needle and a permanent image was recorded.  A J-wire was placed.  After skin nick and dilatation a triple-lumen catheter was placed in the left femoral vein and the wire was removed.  It was secured with 3 silk sutures at the skin at 20 cm.  All 3 lm withdrew dark red nonpulsatile blood and flushed easily with sterile saline.  The sheath and the thrombolytic catheter were then  sutured into place using a Prolene suture.  The patient was taken to the recovery room in stable condition having tolerated the procedure well.  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  The profunda femoris artery was patent.  There was occlusion of the SFA and the previous surgical bypass as well as the stents in the bypass at its origin.  Distal reconstitution was very poor.  After crossing the occlusion getting a catheter down into the anterior tibial artery, selective imaging showed a heavily diseased anterior tibial artery which was not continuous to the foot.   Disposition: Patient was taken to  the recovery room in stable condition having tolerated the procedure well.  Complications: None  Leotis Pain 12/01/2019 6:56 PM   This note was created with Dragon Medical transcription system. Any errors in dictation are purely unintentional.

## 2019-12-02 ENCOUNTER — Encounter: Payer: Self-pay | Admitting: Cardiology

## 2019-12-02 ENCOUNTER — Ambulatory Visit: Admission: RE | Admit: 2019-12-02 | Payer: Medicare Other | Source: Home / Self Care | Admitting: Vascular Surgery

## 2019-12-02 ENCOUNTER — Encounter: Payer: Self-pay | Admitting: Certified Registered"

## 2019-12-02 ENCOUNTER — Encounter
Admission: AD | Disposition: A | Discharge status: Home / Self Care | Payer: Self-pay | Source: Ambulatory Visit | Attending: Vascular Surgery

## 2019-12-02 DIAGNOSIS — I998 Other disorder of circulatory system: Secondary | ICD-10-CM

## 2019-12-02 DIAGNOSIS — T82868A Thrombosis of vascular prosthetic devices, implants and grafts, initial encounter: Secondary | ICD-10-CM

## 2019-12-02 HISTORY — PX: LOWER EXTREMITY ANGIOGRAPHY: CATH118251

## 2019-12-02 LAB — CBC
HCT: 35.2 % — ABNORMAL LOW (ref 39.0–52.0)
Hemoglobin: 11.3 g/dL — ABNORMAL LOW (ref 13.0–17.0)
MCH: 26.5 pg (ref 26.0–34.0)
MCHC: 32.1 g/dL (ref 30.0–36.0)
MCV: 82.4 fL (ref 80.0–100.0)
Platelets: 215 10*3/uL (ref 150–400)
RBC: 4.27 MIL/uL (ref 4.22–5.81)
RDW: 14.7 % (ref 11.5–15.5)
WBC: 6.4 10*3/uL (ref 4.0–10.5)
nRBC: 0 % (ref 0.0–0.2)

## 2019-12-02 LAB — TYPE AND SCREEN
ABO/RH(D): A POS
Antibody Screen: NEGATIVE

## 2019-12-02 LAB — APTT: aPTT: 47 seconds — ABNORMAL HIGH (ref 24–36)

## 2019-12-02 LAB — FIBRINOGEN: Fibrinogen: 393 mg/dL (ref 210–475)

## 2019-12-02 LAB — HEPARIN LEVEL (UNFRACTIONATED): Heparin Unfractionated: 2.02 IU/mL — ABNORMAL HIGH (ref 0.30–0.70)

## 2019-12-02 SURGERY — LOWER EXTREMITY ANGIOGRAPHY
Anesthesia: Moderate Sedation | Laterality: Right

## 2019-12-02 MED ORDER — APIXABAN 5 MG PO TABS
5.0000 mg | ORAL_TABLET | Freq: Two times a day (BID) | ORAL | Status: DC
  Administered 2019-12-03 – 2019-12-05 (×5): 5 mg via ORAL
  Filled 2019-12-02 (×5): qty 1

## 2019-12-02 MED ORDER — ASPIRIN EC 81 MG PO TBEC: 81.0000 mg | DELAYED_RELEASE_TABLET | Freq: Every day | ORAL | Status: DC

## 2019-12-02 MED ORDER — TIROFIBAN HCL IV 12.5 MG/250 ML
INTRAVENOUS | Status: AC
  Filled 2019-12-02: qty 250

## 2019-12-02 MED ORDER — HEPARIN SODIUM (PORCINE) 1000 UNIT/ML IJ SOLN
INTRAMUSCULAR | Status: AC
  Filled 2019-12-02: qty 1

## 2019-12-02 MED ORDER — APIXABAN 5 MG PO TABS: 5.0000 mg | ORAL_TABLET | Freq: Two times a day (BID) | ORAL | Status: DC

## 2019-12-02 MED ORDER — TIROFIBAN (AGGRASTAT) BOLUS VIA INFUSION
25.0000 ug/kg | Freq: Once | INTRAVENOUS | Status: DC
  Filled 2019-12-02: qty 48

## 2019-12-02 MED ORDER — TIROFIBAN (AGGRASTAT) BOLUS VIA INFUSION
INTRAVENOUS | Status: DC | PRN
  Administered 2019-12-02: 2367.5 ug via INTRAVENOUS

## 2019-12-02 MED ORDER — TIROFIBAN HCL IV 12.5 MG/250 ML
0.1500 ug/kg/min | INTRAVENOUS | Status: AC
  Administered 2019-12-02 (×2): 0.15 ug/kg/min via INTRAVENOUS
  Filled 2019-12-02 (×3): qty 100
  Filled 2019-12-02: qty 250

## 2019-12-02 MED ORDER — MIDAZOLAM HCL 2 MG/2ML IJ SOLN
INTRAMUSCULAR | Status: DC | PRN
  Administered 2019-12-02: 1 mg via INTRAVENOUS
  Administered 2019-12-02: 2 mg via INTRAVENOUS

## 2019-12-02 MED ORDER — NITROGLYCERIN 1 MG/10 ML FOR IR/CATH LAB
INTRA_ARTERIAL | Status: AC
  Filled 2019-12-02: qty 10

## 2019-12-02 MED ORDER — CEFAZOLIN SODIUM-DEXTROSE 2-4 GM/100ML-% IV SOLN
2.0000 g | Freq: Once | INTRAVENOUS | Status: AC
  Administered 2019-12-02: 2 g via INTRAVENOUS

## 2019-12-02 MED ORDER — SODIUM CHLORIDE 0.9 % IV BOLUS
INTRAVENOUS | Status: DC | PRN
  Administered 2019-12-02: 500 mL via INTRAVENOUS

## 2019-12-02 MED ORDER — IODIXANOL 320 MG/ML IV SOLN
INTRAVENOUS | Status: DC | PRN
  Administered 2019-12-02: 65 mL via INTRA_ARTERIAL

## 2019-12-02 MED ORDER — HEPARIN SODIUM (PORCINE) 1000 UNIT/ML IJ SOLN
INTRAMUSCULAR | Status: DC | PRN
  Administered 2019-12-02: 1000 [IU] via INTRAVENOUS
  Administered 2019-12-02: 4000 [IU] via INTRAVENOUS

## 2019-12-02 MED ORDER — SODIUM CHLORIDE 0.9 % IV SOLN
INTRAVENOUS | Status: AC
  Administered 2019-12-02 (×2): 75 mL/h via INTRAVENOUS

## 2019-12-02 MED ORDER — NITROGLYCERIN 1 MG/10 ML FOR IR/CATH LAB
INTRA_ARTERIAL | Status: DC | PRN
  Administered 2019-12-02 (×2): 300 ug

## 2019-12-02 MED ORDER — MIDAZOLAM HCL 5 MG/5ML IJ SOLN
INTRAMUSCULAR | Status: AC
  Filled 2019-12-02: qty 5

## 2019-12-02 MED ORDER — ACETAMINOPHEN 325 MG PO TABS
650.0000 mg | ORAL_TABLET | Freq: Four times a day (QID) | ORAL | Status: DC | PRN
  Administered 2019-12-03 – 2019-12-05 (×2): 650 mg via ORAL
  Filled 2019-12-02 (×2): qty 2

## 2019-12-02 MED ORDER — FENTANYL CITRATE (PF) 100 MCG/2ML IJ SOLN
INTRAMUSCULAR | Status: AC
  Filled 2019-12-02: qty 2

## 2019-12-02 MED ORDER — ASPIRIN EC 81 MG PO TBEC
81.0000 mg | DELAYED_RELEASE_TABLET | Freq: Every day | ORAL | Status: DC
  Administered 2019-12-03 – 2019-12-04 (×2): 81 mg via ORAL
  Filled 2019-12-02 (×2): qty 1

## 2019-12-02 MED ORDER — TIROFIBAN HCL IN NACL 5-0.9 MG/100ML-% IV SOLN
INTRAVENOUS | Status: AC | PRN
  Administered 2019-12-02: 0.15 ug/kg/min via INTRAVENOUS

## 2019-12-02 MED ORDER — FENTANYL CITRATE (PF) 100 MCG/2ML IJ SOLN
INTRAMUSCULAR | Status: DC | PRN
  Administered 2019-12-02 (×2): 50 ug via INTRAVENOUS

## 2019-12-02 SURGICAL SUPPLY — 24 items
BALLN DORADO 6X200X135 (BALLOONS) ×2
BALLN DORADO7X100X80 (BALLOONS) ×2
BALLN ULTRASCORE 014 3X100X150 (BALLOONS) ×2
BALLN ULTRVRSE 3X100X150 (BALLOONS) ×2
BALLOON DORADO 6X200X135 (BALLOONS) ×1 IMPLANT
BALLOON DORADO7X100X80 (BALLOONS) ×1 IMPLANT
BALLOON ULTRSCRE 014 3X100X150 (BALLOONS) ×1 IMPLANT
BALLOON ULTRVRSE 3X100X150 (BALLOONS) ×1 IMPLANT
CANISTER PENUMBRA ENGINE (MISCELLANEOUS) ×2 IMPLANT
CATH INDIGO CAT6 KIT (CATHETERS) ×4 IMPLANT
CATH INDIGO SEP 6 (CATHETERS) ×2 IMPLANT
CATH INDIGO SEP 7 (CATHETERS) ×2 IMPLANT
CATH LIGHTNING 7 XTORQ 130 (CATHETERS) ×2 IMPLANT
CATH VERT 5FR 125CM (CATHETERS) ×2 IMPLANT
DEVICE PRESTO INFLATION (MISCELLANEOUS) ×2 IMPLANT
DEVICE STARCLOSE SE CLOSURE (Vascular Products) ×2 IMPLANT
GLIDEWIRE ADV .035X260CM (WIRE) ×2 IMPLANT
PACK ANGIOGRAPHY (CUSTOM PROCEDURE TRAY) ×2 IMPLANT
SHEATH PINNACLE MP 7F 45CM (SHEATH) ×2 IMPLANT
STENT VIABAHN 6X250X120 (Permanent Stent) ×2 IMPLANT
STENT VIABAHN 7X100X120 (Permanent Stent) ×2 IMPLANT
VALVE HEMO TOUHY BORST Y (ADAPTER) ×2 IMPLANT
WIRE MAGIC TORQUE 315CM (WIRE) ×2 IMPLANT
WIRE RUNTHROUGH .014X300CM (WIRE) ×2 IMPLANT

## 2019-12-02 NOTE — Progress Notes (Signed)
Patient seen in the ICU this evening.  He states his foot is aching somewhat but it is much better than when he entered the hospital.  Examination shows that his pulses indeed are palpable with a 2+ posterior tibial on the right.  His foot is hyperemic and edematous consistent with reperfusion status post ischemia.  Successful limb salvage with recanalization of the right lower extremity.  The patient is currently on Aggrastat he will transition to Eliquis and Plavix together tomorrow.  He can be evaluated tomorrow for discharge either later Saturday or Sunday.  He will follow-up in the office in approximately 2 weeks.

## 2019-12-02 NOTE — Consult Note (Signed)
ANTICOAGULATION CONSULT NOTE - Follow Up Consult  Pharmacy Consult for Heparin infusion Indication:  Limb ischemia / VTE treatment  No Known Allergies  Patient Measurements: Height: 6' (182.9 cm) Weight: 94.7 kg (208 lb 12.4 oz) IBW/kg (Calculated) : 77.6 Heparin Dosing Weight: 94.3 kg  Vital Signs: Temp: 98.3 F (36.8 C) (05/14 0000) Temp Source: Oral (05/14 0000) BP: 97/60 (05/14 0500) Pulse Rate: 68 (05/14 0500)  Labs: Recent Labs    12/01/19 1645 12/01/19 2101 12/02/19 0323 12/02/19 0345  HGB  --  11.6* 11.3*  --   HCT  --  36.4* 35.2*  --   PLT  --  241 215  --   APTT  --   --   --  47*  HEPARINUNFRC  --  3.40*  --   --   CREATININE 1.13  --   --   --    Estimated Creatinine Clearance: 82 mL/min (by C-G formula based on SCr of 1.13 mg/dL).  Medications:  Apixaban 5 mg BID (last dose not documented in chart)  Assessment: Patient is a 64 y/o M with medical history significant for PVD s/p multiple endovascular and open interventions to the right lower extremity who is admitted for scheduled procedure. Patient with occlusion of recent right femoral to popliteal bypass stents and received catheter directed thrombolysis with 6 mg TPA and further placement of lysis catheter for continuous thrombolytic therapy overnight. Pharmacy has been consulted to help manage heparin infusion.  Unable to obtain baseline labs as patient was in procedure.  Will follow aPTT in view of likely interference of apixaban on heparin level.  Goal of Therapy:  aPTT 50-75 seconds or roughly 1.5 - 2x control Monitor platelets by anticoagulation protocol: Yes   Plan:  - [id number] aPTT 47, slightly below desired goal, subtherapeutic.  CBC stable.   -Increase heparin to 650 units/hr and recheck aPTT in 6 hours -Daily CBC per protocol (currently ordered q6h x 4 following procedure)  [place] Crane, Brittan Mapel A  12/02/2019,5:35 AM

## 2019-12-02 NOTE — Progress Notes (Signed)
End of Shift Summary:     Patient returned from his procedure at approximately 1130.  Right foot pulses audible via doppler.  Left foot pulses +2 palpable.  Left femoral PAD intact with 40 cc of air and remained at a level 0 throughout the shift. Patient medicated for pain once with oxycodone and it was effective.  Aggrastat running until 0500 on 12-03-19.  Report given to oncoming shift.

## 2019-12-02 NOTE — Progress Notes (Signed)
Patient transported down to specials at this time. Report called by nightshift RN.

## 2019-12-02 NOTE — Op Note (Signed)
[facility] Percutaneous Study/Intervention Procedural Note   Date of Surgery: 12/02/2019  Surgeon: [name]  Pre-operative Diagnosis: Atherosclerotic occlusive disease bilateral lower extremities with acute ischemia of the right lower extremity; complication vascular device with thrombosis  Post-operative diagnosis: Same  Procedure(s) Performed: 1. Introduction catheter into right lower extremity 3rd order catheter placement with 2 additional third order placements. 2. Contrast injection right lower extremity for distal runoff  3. Percutaneous transluminal angioplasty and stent placement right femoral to below-knee popliteal bypass graft  4. Percutaneous transluminal angioplasty to 3 mm of the right posterior tibial artery  5. Percutaneous transluminal angioplasty to 3 mm of the right peroneal artery.              6.  Mechanical thrombectomy of the right femoral-popliteal bypass graft using a penumbra CAT 7 device.              7.  Mechanical thrombectomy of the right tibioperoneal trunk and posterior tibial artery with the penumbra CAT 6 and the penumbra CAT 7 device.              8.  Star close closure left common femoral arteriotomy  Anesthesia: Conscious sedation was administered under my direct supervision by the interventional radiology RN. IV Versed plus fentanyl were utilized. Continuous ECG, pulse oximetry and blood pressure was monitored throughout the entire procedure.  Conscious sedation was for a total of 150 minutes.  Sheath: 7 [place] destination sheath left common femoral retrograde exchanged for the existing sheath  Contrast: 65 cc  Fluoroscopy Time: 21.1 minutes  Indications: [name] presents with increasing pain of the right lower extremity.  Evaluation demonstrated the patient has acute ischemia and he is thrombosed his femoral-popliteal bypass as well as  his tibial outflow.  Yesterday he was initiated on thrombolysis.  He is being returned to the angios suite this morning for follow-up evaluation and treatment.  This suggests the patient is having limb threatening ischemia. The risks and benefits are reviewed all questions answered patient agrees to proceed.  Procedure:Mearle A Godley is a 64 y.o. y.o. male who was identified and appropriate procedural time out was performed. The patient was then placed supine on the table and prepped and draped in the usual sterile fashion.   The existing lysis catheter is used to perform an angiogram which shows there is still a huge amount of thrombus throughout the femoral-popliteal bypass as well as occlusion of the trifurcation with reconstitution of the peroneal and the posterior tibial and there midlevel.   5000 units of heparin was then given and allowed to circulate and a 7 [place] destination sheath was advanced up and over the bifurcation and positioned in the femoral artery and exchanged for the existing [place] sheath.  A penumbra CAT 7 was then opened on the field and mechanical thrombectomy was performed beginning at the level of the common femoral and extending down to the distal outflow.  Tibioperoneal trunk was treated with a CAT 7 at this time.  Follow-up imaging demonstrated persistent thrombus within the femoral-popliteal bypass but there was now better visualization of the tibioperoneal trunk as well as the peroneal and the posterior tibial.  Tibial peroneal trunk was free of stricture and thrombus the posterior tibial demonstrated multiple greater than 80% stenoses in the first 8 to 10 cm.  The tibioperoneal trunk demonstrated greater than 80% stenosis from its origin extending distally 2 to 3 cm.  Next the wire and catheter were negotiated  first of the posterior tibial and a 3 mm x 10 cm ultra versed balloon was used angioplasty was to 10 atm for 1 minute.  The wire and Kumpe catheter was then  negotiated to the peroneal and then the Kumpe catheter was exchanged for the same 3 mm Ultraverse balloon and again a 10 atm angioplasty was performed.  Follow-up imaging demonstrated greater than 60% stenoses in both arteries however I was concerned this could be spasm and 300 mcg nitroglycerin were given.  There was only modest improvement after the nitroglycerin and I elected to negotiate the wire back into the posterior tibial and a 3 mm x 10 cm ultra score balloon was used to treat the posterior tibial again inflation was to 10 atm for 1 minute.  Follow-up imaging demonstrated less than 15% residual stenosis throughout both the peroneal and the posterior tibial arteries at this time.  The wire was then negotiated into the anterior tibial and a CAT 6 device was used to try to perform thrombectomy of the anterior tibial.  This was not successful.  Follow-up imaging now demonstrated a string sign with occlusion of the peroneal and the wire was again negotiated into the peroneal and the 3 mm balloon Ultraverse balloon was used to treat the peroneal.  Follow-up imaging now demonstrated less than 50% stenoses in both arteries.  Attention was then turned to the proximal bypass graft where greater than 60% residual stenosis was noted at the origin and extending into the graft.  I therefore elected to advance a 7 mm x 100 mm Viabahn stent which was then postdilated to 20 atm with a 7 mm x 100 mm Dor[place] balloon.  Follow-up imaging now demonstrated less than 5% residual stenosis peer as I imaged the graft sequentially moving more distally for more lesions were uncovered of greater than 60%.  A 6 mm x 250 mm Viabahn stent was then deployed beginning just below the 7 mm Viabahn and extending down to the level of Hunter's canal.  This was postdilated with a 6 mm x 200 [place] balloon inflations with 20 atm 2 separate inflations were utilized.  Follow-up imaging now demonstrated less than 5% residual stenosis throughout the  entire femoral-popliteal bypass graft.   Unfortunately a piece of thrombus was now noted at the tibioperoneal trunk and a second piece of thrombus noted in the posterior tibial at the origin.  The wire was then negotiated into the posterior tibial and the CAT 7 reintroduced.  Mechanical thrombectomy was then performed of the TP trunk and the proximal posterior tibial.  Follow-up imaging now demonstrated a less than 10% residual stenosis of the tibioperoneal trunk peroneal and posterior tibial.  Distal runoff was then assessed.  He does have a small amount of thrombus distally at the level of the ankle.  Otherwise he now has two-vessel runoff to the foot.   Findings:  Initial imaging demonstrates the thrombus from the origin of the graft all the way down through the entire graft including the tibioperoneal trunk as well as all 3 tibial vessels.  There is reconstitution of the mid peroneal and posterior tibial.  Following mechanical thrombectomy of the femoral-popliteal bypass graft multiple greater than 60% residual stenoses are noted including the origin in the midportion and areas at Hunter's canal.  These are treated with a 7 mm Viabahn proximally postdilated to 7 mm and 6 mm Viabahn in the mid and distal portions postdilated to 6 mm.  With respect to the tibioperoneal trunk thrombectomy  is successful and there is no hemodynamically significant stenosis noted.  With respect to the peroneal and the posterior tibial both of these are treated with multiple 3 mm balloon inflations successfully with less than 15% residual stenosis.  Thrombectomy was also performed of the tibioperoneal trunk as well as the posterior tibial.  With respect to the anterior tibial attempts at thrombectomy are not successful and this vessel remains occluded in its proximal half.  Summary: Successful recanalization right lower extremity for limb salvage with two-vessel runoff   Disposition: Patient  was taken to the recovery room in stable condition having tolerated the procedure well.  Belenda Cruise Rubylee Zamarripa 12/02/2019,9:42 AM

## 2019-12-03 LAB — BASIC METABOLIC PANEL
Anion gap: 8 (ref 5–15)
BUN: 17 mg/dL (ref 8–23)
CO2: 24 mmol/L (ref 22–32)
Calcium: 8.1 mg/dL — ABNORMAL LOW (ref 8.9–10.3)
Chloride: 105 mmol/L (ref 98–111)
Creatinine, Ser: 0.74 mg/dL (ref 0.61–1.24)
GFR calc Af Amer: >60 mL/min (ref >60)
GFR calc non Af Amer: >60 mL/min (ref >60)
Glucose, Bld: 92 mg/dL (ref 70–99)
Potassium: 4.3 mmol/L (ref 3.5–5.1)
Sodium: 137 mmol/L (ref 135–145)

## 2019-12-03 LAB — CBC
HCT: 30.1 % — ABNORMAL LOW (ref 39.0–52.0)
Hemoglobin: 9.7 g/dL — ABNORMAL LOW (ref 13.0–17.0)
MCH: 27.2 pg (ref 26.0–34.0)
MCHC: 32.2 g/dL (ref 30.0–36.0)
MCV: 84.3 fL (ref 80.0–100.0)
Platelets: 211 10*3/uL (ref 150–400)
RBC: 3.57 MIL/uL — ABNORMAL LOW (ref 4.22–5.81)
RDW: 14.6 % (ref 11.5–15.5)
WBC: 6 10*3/uL (ref 4.0–10.5)
nRBC: 0 % (ref 0.0–0.2)

## 2019-12-03 LAB — MAGNESIUM: Magnesium: 2.2 mg/dL (ref 1.7–2.4)

## 2019-12-03 NOTE — Progress Notes (Signed)
1 Day Post-Op   Subjective/Chief Complaint: Doing Well. Notes some aching in right leg and foot, well controlled with current regimen   Objective: Vital signs in last 24 hours: Temp:  [97.8 F (36.6 C)-98.2 F (36.8 C)] 97.8 F (36.6 C) (05/15 1400) Pulse Rate:  [66-80] 71 (05/15 1700) Resp:  [4-17] 15 (05/15 1700) BP: (96-139)/(52-68) 112/62 (05/15 1700) SpO2:  [93 %-100 %] 100 % (05/15 1700) Last BM Date: (PTA)  Intake/Output from previous day: 05/14 0701 - 05/15 0700 In: 1606.1 [I.V.:1606.1] Out: 1825 [Urine:1825] Intake/Output this shift: Total I/O In: 458.9 [I.V.:458.9] Out: 1275 [Urine:1275]  General appearance: alert and no distress Resp: clear to auscultation bilaterally Cardio: regular rate and rhythm Extremities: LEFT groin- soft, no hematoma. Right- warm, mild edema, +PT  Lab Results:  Recent Labs    12/02/19 0323 12/03/19 0757  WBC 6.4 6.0  HGB 11.3* 9.7*  HCT 35.2* 30.1*  PLT 215 211   BMET Recent Labs    12/01/19 1645 12/03/19 0605  NA  --  137  K  --  4.3  CL  --  105  CO2  --  24  GLUCOSE  --  92  BUN 16 17  CREATININE 1.13 0.74  CALCIUM  --  8.1*   PT/INR No results for input(s): LABPROT, INR in the last 72 hours. ABG No results for input(s): PHART, HCO3 in the last 72 hours.  Invalid input(s): PCO2, PO2  Studies/Results: PERIPHERAL VASCULAR CATHETERIZATION  Result Date: 12/02/2019 See op note  PERIPHERAL VASCULAR CATHETERIZATION  Result Date: 12/01/2019 See op note   Anti-infectives: Anti-infectives (From admission, onward)   Start     Dose/Rate Route Frequency Ordered Stop   12/02/19 0800  ceFAZolin (ANCEF) IVPB 2g/100 mL premix     2 g 200 mL/hr over 30 Minutes Intravenous  Once 12/02/19 0748 12/02/19 0819   12/01/19 1645  ceFAZolin (ANCEF) IVPB 2g/100 mL premix    Note to Pharmacy: To be given in specials   2 g 200 mL/hr over 30 Minutes Intravenous  Once 12/01/19 1635 12/01/19 1947      Assessment/Plan: s/p  Procedure(s): LOWER EXTREMITY ANGIOGRAPHY (Right) Eliquis and Plavix  OOB- ambulate as tolerated. Will Plan for D/C home tomorrow.   LOS: 2 days    Joshua [name] 12/03/2019

## 2019-12-03 NOTE — Progress Notes (Signed)
Patients vitals have remained stable throughout shift. Did complain of pain one time, gave prn tylenol per pts request with relief. PAD was removed per vascular order, Femoral line pulled, NS stopped. Pt up to chair and to toilet without any complications. Pt currently resting comfortably in bed.

## 2019-12-03 NOTE — Plan of Care (Signed)

## 2019-12-04 ENCOUNTER — Encounter: Payer: Self-pay | Admitting: Vascular Surgery

## 2019-12-04 ENCOUNTER — Other Ambulatory Visit: Payer: Self-pay

## 2019-12-04 MED ORDER — CLOPIDOGREL BISULFATE 75 MG PO TABS
75.0000 mg | ORAL_TABLET | Freq: Every day | ORAL | Status: DC
  Administered 2019-12-04 – 2019-12-05 (×2): 75 mg via ORAL
  Filled 2019-12-04 (×2): qty 1

## 2019-12-04 MED ORDER — BISACODYL 5 MG PO TBEC
5.0000 mg | DELAYED_RELEASE_TABLET | Freq: Every day | ORAL | Status: DC | PRN
  Administered 2019-12-04: 5 mg via ORAL
  Filled 2019-12-04: qty 1

## 2019-12-04 MED ORDER — BISACODYL 5 MG PO TBEC
10.0000 mg | DELAYED_RELEASE_TABLET | Freq: Every day | ORAL | Status: DC | PRN
  Administered 2019-12-04: 5 mg via ORAL
  Filled 2019-12-04: qty 2

## 2019-12-04 MED ORDER — SODIUM CHLORIDE 0.9 % IV BOLUS
500.0000 mL | Freq: Once | INTRAVENOUS | Status: AC
  Administered 2019-12-04: 500 mL via INTRAVENOUS

## 2019-12-04 NOTE — Progress Notes (Addendum)
Physical Therapy Evaluation Patient Details Name: Joshua [name] MRN: [id number] DOB: Feb 11, 1956 Today's Date: 12/04/2019   History of Present Illness  Per MD note:s/p Procedure(s): LOWER EXTREMITY ANGIOGRAPHY  Clinical Impression  Patient reports minimal pain RLE and agrees to PT evaluation. He is independent with bed mobility and transfers sit to stand without AD. He ambulates 300 feet with RW with step-through gait pattern. He has good seated and standing static and dynamic balance. He has WFL strength BLE. He would benefit from skilled PT to progress gait to [facility] or no assistive device. He lives alone and has steps and a ramp into home. He will continue to benefit from skilled PT to improve mobility.     Follow Up Recommendations No PT follow up    Equipment Recommendations  None recommended by PT    Recommendations for Other Services       Precautions / Restrictions Restrictions Weight Bearing Restrictions: No      Mobility  Bed Mobility Overal bed mobility: Independent                Transfers Overall transfer level: Independent                  Ambulation/Gait Ambulation/Gait assistance: Modified independent (Device/Increase time) Gait Distance (Feet): 300 Feet Assistive device: Rolling walker (2 wheeled) Gait Pattern/deviations: Step-through pattern        Stairs            Wheelchair Mobility    Modified Rankin (Stroke Patients Only)       Balance Overall balance assessment: Modified Independent                                           Pertinent Vitals/Pain Pain Assessment: 0-10 Pain Score: 1  Pain Location: RLE Pain Descriptors / Indicators: Discomfort    Home Living Family/patient expects to be discharged to:: Private residence Living Arrangements: Alone Available Help at Discharge: Friend(s)   Home Access: Ramped entrance       Home Equipment: Environmental consultant - 2 wheels;Cane - single point;Wheelchair -  manual      Prior Function Level of Independence: Independent               Hand Dominance        Extremity/Trunk Assessment   Upper Extremity Assessment Upper Extremity Assessment: Overall WFL for tasks assessed    Lower Extremity Assessment Lower Extremity Assessment: Overall WFL for tasks assessed       Communication   Communication: No difficulties  Cognition Arousal/Alertness: Awake/alert Behavior During Therapy: WFL for tasks assessed/performed Overall Cognitive Status: Within Functional Limits for tasks assessed                                        General Comments      Exercises     Assessment/Plan    PT Assessment Patient needs continued PT services  PT Problem List Decreased mobility       PT Treatment Interventions Gait training    PT Goals (Current goals can be found in the Care Plan section)  Acute Rehab PT Goals Patient Stated Goal: to walk without AD PT Goal Formulation: With patient Time For Goal Achievement: 12/18/19 Potential to Achieve Goals: Good    Frequency Min 2X/week  Barriers to discharge        Co-evaluation               AM-PAC PT "6 Clicks" Mobility  Outcome Measure Help needed turning from your back to your side while in a flat bed without using bedrails?: None Help needed moving from lying on your back to sitting on the side of a flat bed without using bedrails?: None Help needed moving to and from a bed to a chair (including a wheelchair)?: None Help needed standing up from a chair using your arms (e.g., wheelchair or bedside chair)?: None Help needed to walk in hospital room?: A Little Help needed climbing 3-5 steps with a railing? : A Little 6 Click Score: 22    End of Session Equipment Utilized During Treatment: Gait belt Activity Tolerance: Patient tolerated treatment well Patient left: in bed;with bed alarm set Nurse Communication: Mobility status PT Visit Diagnosis: Difficulty  in walking, not elsewhere classified (R26.2)    Time: 0511-0211 PT Time Calculation (min) (ACUTE ONLY): 20 min   Charges:   PT Evaluation $PT Eval Low Complexity: 1 Low PT Treatments $Gait Training: 8-22 mins          Alanson Puls, PT DPT 12/04/2019, 3:26 PM

## 2019-12-04 NOTE — Progress Notes (Signed)
Patient has not had a BM since 11/30/19. Order received from Dr Esco for dulcolax 

## 2019-12-04 NOTE — Progress Notes (Signed)
2 Days Post-Op   Subjective/Chief Complaint: Notes aching in right leg/foot. Improved from yesterday. He did not ambulate much yesterday secondary to discomfort.   Objective: Vital signs in last 24 hours: Temp:  [97.7 F (36.5 C)-98 F (36.7 C)] 98 F (36.7 C) (05/16 0805) Pulse Rate:  [67-72] 68 (05/16 0805) Resp:  [8-16] 12 (05/16 0805) BP: (110-167)/(52-82) 128/66 (05/16 0805) SpO2:  [95 %-100 %] 98 % (05/16 0805) Weight:  [94.3 kg] 94.3 kg (05/16 0355) Last BM Date: 11/30/19  Intake/Output from previous day: 05/15 0701 - 05/16 0700 In: 458.9 [I.V.:458.9] Out: 2325 [Urine:2325] Intake/Output this shift: Total I/O In: -  Out: 500 [Urine:500]  General appearance: alert and no distress Resp: clear to auscultation bilaterally Cardio: regular rate and rhythm Extremities: RIGHT- warm, calf, thigh-soft, +PT, Left groin- soft at access site.  Lab Results:  Recent Labs    12/02/19 0323 12/03/19 0757  WBC 6.4 6.0  HGB 11.3* 9.7*  HCT 35.2* 30.1*  PLT 215 211   BMET Recent Labs    12/01/19 1645 12/03/19 0605  NA  --  137  K  --  4.3  CL  --  105  CO2  --  24  GLUCOSE  --  92  BUN 16 17  CREATININE 1.13 0.74  CALCIUM  --  8.1*   PT/INR No results for input(s): LABPROT, INR in the last 72 hours. ABG No results for input(s): PHART, HCO3 in the last 72 hours.  Invalid input(s): PCO2, PO2  Studies/Results: No results found.  Anti-infectives: Anti-infectives (From admission, onward)   Start     Dose/Rate Route Frequency Ordered Stop   12/02/19 0800  ceFAZolin (ANCEF) IVPB 2g/100 mL premix     2 g 200 mL/hr over 30 Minutes Intravenous  Once 12/02/19 0748 12/02/19 0819   12/01/19 1645  ceFAZolin (ANCEF) IVPB 2g/100 mL premix    Note to Pharmacy: To be given in specials   2 g 200 mL/hr over 30 Minutes Intravenous  Once 12/01/19 1635 12/01/19 1947      Assessment/Plan: s/p Procedure(s): LOWER EXTREMITY ANGIOGRAPHY (Right)  Eliquis and Plavix Patient  states he lives alone and does not feel strong enough to ambulate at home alone. Will have PT assess patient today.  LOS: 3 days    Evaristo Bury 12/04/2019

## 2019-12-05 ENCOUNTER — Encounter: Payer: Self-pay | Admitting: Cardiology

## 2019-12-05 DIAGNOSIS — I998 Other disorder of circulatory system: Secondary | ICD-10-CM

## 2019-12-05 MED ORDER — ASPIRIN EC 81 MG PO TBEC
81.0000 mg | DELAYED_RELEASE_TABLET | Freq: Every day | ORAL | Status: DC
Start: 2019-12-05 — End: 2020-03-15

## 2019-12-05 NOTE — Discharge Summary (Signed)
[name]    Discharge Summary  Patient ID:  Joshua [name] SURGEON MRN: [id number] DOB/AGE: 1955-08-23 64 y.o.  Admit date: 12/01/2019 Discharge date: 12/05/2019 Date of Surgery: 12/02/2019 Surgeon: Surgeon(s): Schnier, Dolores Lory, MD  Admission Diagnosis: Ischemia of right lower extremity [I99.8]  Discharge Diagnoses:  Ischemia of right lower extremity [I99.8]  Secondary Diagnoses: Past Medical History:  Diagnosis Date  . Allergy   . Dyspnea    former smoker. only doe  . Peripheral vascular disease ([place])    Procedure(s): 12/02/19: 1. Introduction catheter into right lower extremity 3rd order catheter placement with 2 additional third order placements. 2. Contrast injection right lower extremity for distal runoff  3. Percutaneous transluminal angioplasty and stent placement right femoral to below-knee popliteal bypass graft  4. Percutaneous transluminal angioplasty to 3 mm of the right posterior tibial artery  5. Percutaneous transluminal angioplasty to 3 mm of the right peroneal artery.              6.  Mechanical thrombectomy of the right femoral-popliteal bypass graft using a penumbra CAT 7 device.              7.  Mechanical thrombectomy of the right tibioperoneal trunk and posterior tibial artery with the penumbra CAT 6 and the penumbra CAT 7 device.              8.  Star close closure left common femoral arteriotomy  12/01/19: 1. Ultrasound guidance for vascular access left femoral artery 2. Catheter placement into right anterior tibial artery from left femoral approach 3. Aortogram and selective right lower extremity angiogram 4.  6 mg of TPA delivered in a lysis catheter from the origin of the right femoral-popliteal bypass down the proximal anterior tibial artery with placement of the lysis catheter for continuous thrombolytic  therapy overnight 5.  Ultrasound guidance for vascular access left femoral vein             6.  Triple-lumen catheter placement left femoral vein  Discharged Condition: Good  HPI / Hospital Course:  Patient is a 64 y.o.male with ischemic rest pain in the right lower extremity with acute on chronic ischemia and occlusion of his recent intervention which was on his right femoral to popliteal bypass. The patient has noninvasive study showing occlusion of the bypass with very poor flow distally. The patient is brought in for angiography for further evaluation and potential treatment.  Due to the limb threatening nature of the situation, angiogram was performed for attempted limb salvage. The patient is aware that if the procedure fails, amputation would be expected.  The patient also understands that even with successful revascularization, amputation may still be required due to the severity of the situation.  Risks and benefits are discussed and informed consent is obtained.  On 12/01/19, the patient underwent:  1. Ultrasound guidance for vascular access left femoral artery 2. Catheter placement into right anterior tibial artery from left femoral approach 3. Aortogram and selective right lower extremity angiogram 4.  6 mg of TPA delivered in a lysis catheter from the origin of the right femoral-popliteal bypass down the proximal anterior tibial artery with placement of the lysis catheter for continuous thrombolytic therapy overnight 5.  Ultrasound guidance for vascular access left femoral vein             6.  Triple-lumen catheter placement left femoral vein   He tolerated the procedure well was transferred from the angiography suite to the ICU.  Patient was treated with TPA and heparin overnight and taken back to the angiography suite the next morning.  12/02/19 the patient underwent:  1. Introduction catheter  into right lower extremity 3rd order catheter placement with 2 additional third order placements. 2. Contrast injection right lower extremity for distal runoff  3. Percutaneous transluminal angioplasty and stent placement right femoral to below-knee popliteal bypass graft  4. Percutaneous transluminal angioplasty to 3 mm of the right posterior tibial artery  5. Percutaneous transluminal angioplasty to 3 mm of the right peroneal artery.              6.  Mechanical thrombectomy of the right femoral-popliteal bypass graft using a penumbra CAT 7 device.              7.  Mechanical thrombectomy of the right tibioperoneal trunk and posterior tibial artery with the penumbra CAT 6 and the penumbra CAT 7 device.              8.  Star close closure left common femoral arteriotomy  The patient tolerated procedure well was transferred back to the ICU and underwent Aggrastat infusion overnight.  Postop day #3 the patient was transitioned to aspirin and Eliquis.  Patient was seen by physical therapy who deemed it safe for him to be discharged home.  During the patient's inpatient stay, his diet was advanced, his Foley was removed, his pain was controlled to the use of p.o. pain medication and he was ambulating at baseline.  Upon discharge, the patient was afebrile with stable vital signs and improved physical exam.  Physical exam: Alert and oriented x3, NAD Cardiovascular: Regular rate and rhythm Pulmonary: Clear to auscultation bilaterally Abdomen: Soft, nontender, nondistended, (+) bowel sounds Access site: Clean dry intact.  No swelling or ecchymosis noted. Right lower extremity:  Thigh soft.  Calf soft.  Extremities warm distally to toes.  Hard to palpate pedal pulses however good capillary refill.  Motor/sensory is intact.  Labs: As below  Complications: None  Consults: None  Significant Diagnostic Studies: CBC Lab Results  Component Value Date    WBC 6.0 12/03/2019   HGB 9.7 (L) 12/03/2019   HCT 30.1 (L) 12/03/2019   MCV 84.3 12/03/2019   PLT 211 12/03/2019   BMET    Component Value Date/Time   NA 137 12/03/2019 0605   NA 141 10/14/2019 1101   K 4.3 12/03/2019 0605   CL 105 12/03/2019 0605   CO2 24 12/03/2019 0605   GLUCOSE 92 12/03/2019 0605   BUN 17 12/03/2019 0605   BUN 15 10/14/2019 1101   BUN 16 07/03/2014 1205   CREATININE 0.74 12/03/2019 0605   CREATININE 1.34 (H) 07/03/2014 1205   CALCIUM 8.1 (L) 12/03/2019 0605   GFRNONAA >60 12/03/2019 0605   GFRNONAA 59 (L) 07/03/2014 1205   GFRAA >60 12/03/2019 0605   GFRAA >60 07/03/2014 1205   COAG Lab Results  Component Value Date   INR 1.11 06/25/2016   INR 1.90 05/12/2016   INR 1.20 12/05/2015   Disposition:  Discharge to :Home  Allergies as of 12/05/2019   No Known Allergies     Medication List    TAKE these medications   acetaminophen 650 MG CR tablet Commonly known as: TYLENOL Take 1,300 mg by mouth daily as needed (headache/pain).   apixaban 5 MG Tabs tablet Commonly known as: ELIQUIS Take 1 tablet (5 mg total) by mouth 2 (two) times daily.   aspirin EC 81 MG tablet Take 1  tablet (81 mg total) by mouth daily.   gabapentin 300 MG capsule Commonly known as: NEURONTIN Take 1 capsule (300 mg total) by mouth at bedtime.   lisinopril 10 MG tablet Commonly known as: ZESTRIL Take 1 tablet (10 mg total) by mouth daily.   simvastatin 20 MG tablet Commonly known as: ZOCOR Take 1 tablet (20 mg total) by mouth daily at 6 PM.      Verbal and written Discharge instructions given to the patient. Wound care per Discharge AVS Follow-up Information    Schnier, Dolores Lory, MD Follow up in 1 month(s).   Specialties: Vascular Surgery, Cardiology, Radiology, Vascular Surgery Why: Can see Schnier or Arna Medici. Will need ABI with visit.  Contact information: [place] [address] [phone]          Signed: Sela Hua,  PA-C  12/05/2019, 11:50 AM

## 2019-12-05 NOTE — Progress Notes (Signed)
Joshua [name] to be D/C'd home per MD order.  Discussed prescriptions and follow up appointments with the patient. Prescriptions given to patient, medication list explained in detail. Pt verbalized understanding.  Allergies as of 12/05/2019   No Known Allergies      Medication List     TAKE these medications    acetaminophen 650 MG CR tablet Commonly known as: TYLENOL Take 1,300 mg by mouth daily as needed (headache/pain).   apixaban 5 MG Tabs tablet Commonly known as: ELIQUIS Take 1 tablet (5 mg total) by mouth 2 (two) times daily.   aspirin EC 81 MG tablet Take 1 tablet (81 mg total) by mouth daily.   gabapentin 300 MG capsule Commonly known as: NEURONTIN Take 1 capsule (300 mg total) by mouth at bedtime.   lisinopril 10 MG tablet Commonly known as: ZESTRIL Take 1 tablet (10 mg total) by mouth daily.   simvastatin 20 MG tablet Commonly known as: ZOCOR Take 1 tablet (20 mg total) by mouth daily at 6 PM.        Vitals:   12/05/19 0444 12/05/19 1220  BP: 129/71 124/67  Pulse: 74 77  Resp: 20 20  Temp: 98.2 F (36.8 C) 97.7 F (36.5 C)  SpO2: 98% 99%    Skin clean, dry and intact without evidence of skin break down, no evidence of skin tears noted. IV catheter discontinued intact. Site without signs and symptoms of complications. Dressing and pressure applied. Pt denies pain at this time. No complaints noted.  An After Visit Summary was printed and given to the patient. Patient escorted via [place], and D/C home via private auto.  Joshua [name]

## 2019-12-05 NOTE — Care Management Important Message (Signed)
Important Message  Patient Details  Name: STCLAIR SZYMBORSKI MRN: [id number] Date of Birth: 05/02/1956   Medicare Important Message Given:  Yes     [name] 12/05/2019, 11:23 AM

## 2019-12-06 ENCOUNTER — Telehealth (INDEPENDENT_AMBULATORY_CARE_PROVIDER_SITE_OTHER): Payer: Self-pay

## 2019-12-06 ENCOUNTER — Telehealth: Payer: Self-pay | Admitting: Family Medicine

## 2019-12-06 NOTE — Telephone Encounter (Signed)
Pt would like Dr B to look at the last 2 days he was in the hospital and see which meds they gave him. Pt thinks it was plavix and ELIQUIS.  Pt would like to know what meds he was on last 2 days he was there

## 2019-12-06 NOTE — Telephone Encounter (Signed)
Patient was made aware with medical advice but he will like for me to speak with Dr Delana Meyer and ask why he was giving Clopidogrel and Eliquis at the hospital. I informed the patient that I will speak with Dr Delana Meyer when he comes in the office.

## 2019-12-06 NOTE — Telephone Encounter (Signed)
He should take the ASA and eliquis together.  He shouldn't be taking plavix

## 2019-12-07 ENCOUNTER — Encounter (INDEPENDENT_AMBULATORY_CARE_PROVIDER_SITE_OTHER): Payer: Medicare Other

## 2019-12-07 ENCOUNTER — Ambulatory Visit (INDEPENDENT_AMBULATORY_CARE_PROVIDER_SITE_OTHER): Payer: Medicare Other | Admitting: Nurse Practitioner

## 2019-12-07 NOTE — Telephone Encounter (Signed)
From what I can tell, he got Aspirin and eliquis after the procedure, not plavix.  Even if he had gotten plavix, that could have been appropriate with the Eliquis.  Is there  concern that he has?

## 2019-12-08 NOTE — Telephone Encounter (Signed)
Patient advised as below. Patient does not have any other questions or concerns.

## 2019-12-13 NOTE — Telephone Encounter (Signed)
I spoke with Dr Delana Meyer he recommended for the patient to take Clopidogrel and Eliquis together at the hospital and continue everyday. I left a detail message on patient voicemail with medical advice.

## 2019-12-15 ENCOUNTER — Encounter (INDEPENDENT_AMBULATORY_CARE_PROVIDER_SITE_OTHER): Payer: Medicare Other

## 2019-12-15 ENCOUNTER — Ambulatory Visit (INDEPENDENT_AMBULATORY_CARE_PROVIDER_SITE_OTHER): Payer: Medicare Other | Admitting: Vascular Surgery

## 2020-01-03 ENCOUNTER — Other Ambulatory Visit (INDEPENDENT_AMBULATORY_CARE_PROVIDER_SITE_OTHER): Payer: Self-pay | Admitting: Vascular Surgery

## 2020-01-03 DIAGNOSIS — Z95820 Peripheral vascular angioplasty status with implants and grafts: Secondary | ICD-10-CM

## 2020-01-03 DIAGNOSIS — I998 Other disorder of circulatory system: Secondary | ICD-10-CM

## 2020-01-05 ENCOUNTER — Other Ambulatory Visit: Payer: Self-pay

## 2020-01-05 ENCOUNTER — Ambulatory Visit (INDEPENDENT_AMBULATORY_CARE_PROVIDER_SITE_OTHER): Payer: Medicare Other

## 2020-01-05 ENCOUNTER — Ambulatory Visit (INDEPENDENT_AMBULATORY_CARE_PROVIDER_SITE_OTHER): Payer: Medicare Other | Admitting: Vascular Surgery

## 2020-01-05 ENCOUNTER — Encounter (INDEPENDENT_AMBULATORY_CARE_PROVIDER_SITE_OTHER): Payer: Self-pay | Admitting: Vascular Surgery

## 2020-01-05 VITALS — BP 121/66 | HR 76 | Resp 16 | Wt 213.0 lb

## 2020-01-05 DIAGNOSIS — I10 Essential (primary) hypertension: Secondary | ICD-10-CM

## 2020-01-05 DIAGNOSIS — E782 Mixed hyperlipidemia: Secondary | ICD-10-CM | POA: Diagnosis not present

## 2020-01-05 DIAGNOSIS — I998 Other disorder of circulatory system: Secondary | ICD-10-CM

## 2020-01-05 DIAGNOSIS — Z95820 Peripheral vascular angioplasty status with implants and grafts: Secondary | ICD-10-CM

## 2020-01-05 DIAGNOSIS — I70219 Atherosclerosis of native arteries of extremities with intermittent claudication, unspecified extremity: Secondary | ICD-10-CM

## 2020-01-05 NOTE — Progress Notes (Signed)
MRN : [id number]  Joshua [name] is a 65 y.o. (04-06-1956) male who presents with chief complaint of  Chief Complaint  Patient presents with  . Follow-up    [facility] 58month post angio  .  History of Present Illness:   The patient returns to the office for followup and review status post angiogram with intervention on 12/02/2019.   1.  Percutaneous transluminal angioplasty and stent placement right femoral to below-knee popliteal bypass graft with percutaneous transluminal angioplasty to 3 mm of the right posterior tibial artery and percutaneous transluminal angioplasty to 3 mm of the right peroneal artery.  2.  Mechanical thrombectomy of the right femoral-popliteal bypass graft using a penumbra CAT 7 device with mechanical thrombectomy of the right tibioperoneal trunk and posterior tibial artery with the penumbra CAT 6 and the penumbra CAT 7 device.  The patient notes improvement in the lower extremity symptoms. No interval shortening of the patient's claudication distance or rest pain symptoms. Previous wounds have now healed.  No new ulcers or wounds have occurred since the last visit.  There have been no significant changes to the patient's overall health care.  The patient denies amaurosis fugax or recent TIA symptoms. There are no recent neurological changes noted. The patient denies history of DVT, PE or superficial thrombophlebitis. The patient denies recent episodes of angina or shortness of breath.   ABI's Rt=1.12 and Lt=1.08  (previous ABI's Rt=0.50 and Lt=1.24)   Current Meds  Medication Sig  . acetaminophen (TYLENOL) 650 MG CR tablet Take 1,300 mg by mouth daily as needed (headache/pain).   Marland Kitchen apixaban (ELIQUIS) 5 MG TABS tablet Take 1 tablet (5 mg total) by mouth 2 (two) times daily.  . clopidogrel (PLAVIX) 75 MG tablet Take 75 mg by mouth daily.  Marland Kitchen gabapentin (NEURONTIN) 300 MG capsule Take 1 capsule (300 mg total) by mouth at bedtime.  Marland Kitchen lisinopril (ZESTRIL) 10 MG  tablet Take 1 tablet (10 mg total) by mouth daily.  . simvastatin (ZOCOR) 20 MG tablet Take 1 tablet (20 mg total) by mouth daily at 6 PM.    Past Medical History:  Diagnosis Date  . Allergy   . Dyspnea    former smoker. only doe  . Peripheral vascular disease [facility])     Past Surgical History:  Procedure Laterality Date  . BACK SURGERY  2011  . CENTRAL LINE INSERTION Right 09/10/2016   Procedure: CENTRAL LINE INSERTION;  Surgeon: Katha Cabal, MD;  Location: [facility] ORS;  Service: Vascular;  Laterality: Right;  . COLONOSCOPY  2013 ?  . FEMORAL-POPLITEAL BYPASS GRAFT Right 09/10/2016   Procedure: BYPASS GRAFT FEMORAL-POPLITEAL ARTERY ( BELOW KNEE );  Surgeon: Katha Cabal, MD;  Location: [facility] ORS;  Service: Vascular;  Laterality: Right;  . LOWER EXTREMITY ANGIOGRAPHY Right 11/18/2016   Procedure: Lower Extremity Angiography;  Surgeon: Katha Cabal, MD;  Location: [place] [facility];  Service: Cardiovascular;  Laterality: Right;  . LOWER EXTREMITY ANGIOGRAPHY Right 12/09/2016   Procedure: Lower Extremity Angiography;  Surgeon: Katha Cabal, MD;  Location: [place] [facility];  Service: Cardiovascular;  Laterality: Right;  . LOWER EXTREMITY ANGIOGRAPHY Right 11/15/2019   Procedure: LOWER EXTREMITY ANGIOGRAPHY;  Surgeon: Katha Cabal, MD;  Location: [place] [facility];  Service: Cardiovascular;  Laterality: Right;  . LOWER EXTREMITY ANGIOGRAPHY Right 12/01/2019   Procedure: Lower Extremity Angiography;  Surgeon: Algernon Huxley, MD;  Location: [place] [facility];  Service: Cardiovascular;  Laterality: Right;  .  LOWER EXTREMITY ANGIOGRAPHY Right 12/02/2019   Procedure: LOWER EXTREMITY ANGIOGRAPHY;  Surgeon: Katha Cabal, MD;  Location: [place] [facility];  Service: Cardiovascular;  Laterality: Right;  . LOWER EXTREMITY INTERVENTION  11/18/2016   Procedure: Lower Extremity Intervention;  Surgeon: Katha Cabal, MD;  Location: [place] [facility];  Service:  Cardiovascular;;  . PERIPHERAL VASCULAR CATHETERIZATION Right 01/02/2015   Procedure: Lower Extremity Angiography;  Surgeon: Katha Cabal, MD;  Location: [place] [facility];  Service: Cardiovascular;  Laterality: Right;  . PERIPHERAL VASCULAR CATHETERIZATION Right 06/18/2015   Procedure: Lower Extremity Angiography;  Surgeon: Algernon Huxley, MD;  Location: [place] [facility];  Service: Cardiovascular;  Laterality: Right;  . PERIPHERAL VASCULAR CATHETERIZATION  06/18/2015   Procedure: Lower Extremity Intervention;  Surgeon: Algernon Huxley, MD;  Location: [place] [facility];  Service: Cardiovascular;;  . PERIPHERAL VASCULAR CATHETERIZATION N/A 10/09/2015   Procedure: Abdominal Aortogram w/Lower Extremity;  Surgeon: Katha Cabal, MD;  Location: [place] [facility];  Service: Cardiovascular;  Laterality: N/A;  . PERIPHERAL VASCULAR CATHETERIZATION  10/09/2015   Procedure: Lower Extremity Intervention;  Surgeon: Katha Cabal, MD;  Location: [place] [facility];  Service: Cardiovascular;;  . PERIPHERAL VASCULAR CATHETERIZATION Right 10/10/2015   Procedure: Lower Extremity Angiography;  Surgeon: Algernon Huxley, MD;  Location: [place] [facility];  Service: Cardiovascular;  Laterality: Right;  . PERIPHERAL VASCULAR CATHETERIZATION Right 10/10/2015   Procedure: Lower Extremity Intervention;  Surgeon: Algernon Huxley, MD;  Location: [place] [facility];  Service: Cardiovascular;  Laterality: Right;  . PERIPHERAL VASCULAR CATHETERIZATION Right 12/03/2015   Procedure: Lower Extremity Angiography;  Surgeon: Algernon Huxley, MD;  Location: [place] [facility];  Service: Cardiovascular;  Laterality: Right;  . PERIPHERAL VASCULAR CATHETERIZATION Right 12/04/2015   Procedure: Lower Extremity Angiography;  Surgeon: Algernon Huxley, MD;  Location: [place] [facility];  Service: Cardiovascular;  Laterality: Right;  . PERIPHERAL VASCULAR CATHETERIZATION  12/04/2015   Procedure: Lower Extremity Intervention;  Surgeon:  Algernon Huxley, MD;  Location: [place] [facility];  Service: Cardiovascular;;  . PERIPHERAL VASCULAR CATHETERIZATION Right 06/30/2016   Procedure: Lower Extremity Angiography;  Surgeon: Algernon Huxley, MD;  Location: [place] [facility];  Service: Cardiovascular;  Laterality: Right;  . PERIPHERAL VASCULAR CATHETERIZATION Right 06/25/2016   Procedure: Lower Extremity Angiography;  Surgeon: Katha Cabal, MD;  Location: [place] [facility];  Service: Cardiovascular;  Laterality: Right;  . PERIPHERAL VASCULAR CATHETERIZATION Right 07/01/2016   Procedure: Lower Extremity Angiography;  Surgeon: Katha Cabal, MD;  Location: [place] [facility];  Service: Cardiovascular;  Laterality: Right;  . PERIPHERAL VASCULAR CATHETERIZATION  07/01/2016   Procedure: Lower Extremity Intervention;  Surgeon: Katha Cabal, MD;  Location: [place] [facility];  Service: Cardiovascular;;  . PERIPHERAL VASCULAR CATHETERIZATION Right 08/01/2016   Procedure: Lower Extremity Angiography;  Surgeon: Katha Cabal, MD;  Location: [place] [facility];  Service: Cardiovascular;  Laterality: Right;  . stent placement in right leg Right     Social History Social History   Tobacco Use  . Smoking status: Former Smoker    Packs/day: 1.50    Years: 38.00    Pack years: 57.00    Types: Cigarettes    Quit date: 12/20/2014    Years since quitting: 5.0  . Smokeless tobacco: Former Network engineer  . Vaping Use: Never used  Substance Use Topics  . Alcohol use: Not Currently  .  Drug use: No    Family History Family History  Problem Relation Age of Onset  . Diabetes Mother   . Hypertension Mother   . Heart murmur Mother   . Colon cancer Neg Hx   . Breast cancer Neg Hx     No Known Allergies   REVIEW OF SYSTEMS (Negative unless checked)  Constitutional: [] Weight loss  [] Fever  [] Chills Cardiac: [] Chest pain   [] Chest pressure   [] Palpitations   [] Shortness of breath when laying flat   [] Shortness of  breath with exertion. Vascular:  [x] Pain in legs with walking   [] Pain in legs at rest  [] History of DVT   [] Phlebitis   [] Swelling in legs   [] Varicose veins   [] Non-healing ulcers Pulmonary:   [] Uses home oxygen   [] Productive cough   [] Hemoptysis   [] Wheeze  [] COPD   [] Asthma Neurologic:  [] Dizziness   [] Seizures   [] History of stroke   [] History of TIA  [] Aphasia   [] Vissual changes   [] Weakness or numbness in arm   [] Weakness or numbness in leg Musculoskeletal:   [] Joint swelling   [x] Joint pain   [x] Low back pain Hematologic:  [] Easy bruising  [] Easy bleeding   [] Hypercoagulable state   [] Anemic Gastrointestinal:  [] Diarrhea   [] Vomiting  [] Gastroesophageal reflux/heartburn   [] Difficulty swallowing. Genitourinary:  [] Chronic kidney disease   [] Difficult urination  [] Frequent urination   [] Blood in urine Skin:  [] Rashes   [] Ulcers  Psychological:  [] History of anxiety   []  History of major depression.  Physical Examination  Vitals:   01/05/20 1125  BP: 121/66  Pulse: 76  Resp: 16  Weight: 213 lb (96.6 kg)   Body mass index is 28.89 kg/m. Gen: WD/WN, NAD Head: [place]/AT, No temporalis wasting.  Ear/Nose/Throat: Hearing grossly intact, nares w/o erythema or drainage Eyes: PER, EOMI, sclera nonicteric.  Neck: Supple, no large masses.   Pulmonary:  Good air movement, no audible wheezing bilaterally, no use of accessory muscles.  Cardiac: RRR, no JVD Vascular:  Vessel Right Left  Radial Palpable Palpable  PT 2+ Palpable Palpable  DP Not Palpable Palpable  Gastrointestinal: Non-distended. No guarding/no peritoneal signs.  Musculoskeletal: M/S 5/5 throughout.  No deformity or atrophy.  Neurologic: CN 2-12 intact. Symmetrical.  Speech is fluent. Motor exam as listed above. Psychiatric: Judgment intact, Mood & affect appropriate for pt's clinical situation. Dermatologic: No rashes or ulcers noted.  No changes consistent with cellulitis.   CBC Lab Results  Component Value Date    WBC 6.0 12/03/2019   HGB 9.7 (L) 12/03/2019   HCT 30.1 (L) 12/03/2019   MCV 84.3 12/03/2019   PLT 211 12/03/2019    BMET    Component Value Date/Time   NA 137 12/03/2019 0605   NA 141 10/14/2019 1101   K 4.3 12/03/2019 0605   CL 105 12/03/2019 0605   CO2 24 12/03/2019 0605   GLUCOSE 92 12/03/2019 0605   BUN 17 12/03/2019 0605   BUN 15 10/14/2019 1101   BUN 16 07/03/2014 1205   CREATININE 0.74 12/03/2019 0605   CREATININE 1.34 (H) 07/03/2014 1205   CALCIUM 8.1 (L) 12/03/2019 0605   GFRNONAA >60 12/03/2019 0605   GFRNONAA 59 (L) 07/03/2014 1205   GFRAA >60 12/03/2019 0605   GFRAA >60 07/03/2014 1205   CrCl cannot be calculated (Patient's most recent lab result is older than the maximum 21 days allowed.).  COAG Lab Results  Component Value Date   INR 1.11 06/25/2016   INR 1.90  05/12/2016   INR 1.20 12/05/2015    Radiology No results found.   Assessment/Plan 1. Atherosclerosis of lower extremity with claudication ([place]) Recommend:  The patient is status post successful angiogram with intervention.  The patient reports that the claudication symptoms and leg pain is essentially gone.   The patient denies lifestyle limiting changes at this point in time.  No further invasive studies, angiography or surgery at this time The patient should continue walking and begin a more formal exercise program.  The patient should continue antiplatelet therapy and aggressive treatment of the lipid abnormalities  The patient should continue wearing graduated compression socks 10-15 mmHg strength to control the mild edema.  Patient should undergo noninvasive studies as ordered. The patient will follow up with me after the studies.   - VAS [place] LOWER EXTREMITY ARTERIAL DUPLEX; Future - VAS [place] ABI WITH/WO TBI; Future  2. Essential hypertension Continue antihypertensive medications as already ordered, these medications have been reviewed and there are no changes at this time.   3.  Mixed hyperlipidemia Continue statin as ordered and reviewed, no changes at this time    [name], MD  01/05/2020 11:45 AM

## 2020-01-19 DIAGNOSIS — M5441 Lumbago with sciatica, right side: Secondary | ICD-10-CM | POA: Diagnosis not present

## 2020-01-19 DIAGNOSIS — M47812 Spondylosis without myelopathy or radiculopathy, cervical region: Secondary | ICD-10-CM | POA: Diagnosis not present

## 2020-01-19 DIAGNOSIS — M4319 Spondylolisthesis, multiple sites in spine: Secondary | ICD-10-CM | POA: Diagnosis not present

## 2020-01-19 DIAGNOSIS — I700 Atherosclerosis of aorta: Secondary | ICD-10-CM | POA: Diagnosis not present

## 2020-01-19 DIAGNOSIS — M5442 Lumbago with sciatica, left side: Secondary | ICD-10-CM | POA: Diagnosis not present

## 2020-01-19 DIAGNOSIS — R200 Anesthesia of skin: Secondary | ICD-10-CM | POA: Diagnosis not present

## 2020-01-19 DIAGNOSIS — M4326 Fusion of spine, lumbar region: Secondary | ICD-10-CM | POA: Diagnosis not present

## 2020-01-19 DIAGNOSIS — M47816 Spondylosis without myelopathy or radiculopathy, lumbar region: Secondary | ICD-10-CM | POA: Diagnosis not present

## 2020-01-19 DIAGNOSIS — M79602 Pain in left arm: Secondary | ICD-10-CM | POA: Diagnosis not present

## 2020-01-19 DIAGNOSIS — G8929 Other chronic pain: Secondary | ICD-10-CM | POA: Diagnosis not present

## 2020-01-20 ENCOUNTER — Other Ambulatory Visit: Payer: Self-pay | Admitting: Nurse Practitioner

## 2020-01-20 ENCOUNTER — Other Ambulatory Visit (HOSPITAL_COMMUNITY): Payer: Self-pay | Admitting: Nurse Practitioner

## 2020-01-20 DIAGNOSIS — M79602 Pain in left arm: Secondary | ICD-10-CM

## 2020-01-20 DIAGNOSIS — M5442 Lumbago with sciatica, left side: Secondary | ICD-10-CM

## 2020-01-20 DIAGNOSIS — G8929 Other chronic pain: Secondary | ICD-10-CM

## 2020-02-06 ENCOUNTER — Ambulatory Visit
Admission: RE | Admit: 2020-02-06 | Discharge: 2020-02-06 | Disposition: A | Discharge status: Home / Self Care | Payer: Medicare Other | Source: Ambulatory Visit | Attending: Nurse Practitioner | Admitting: Nurse Practitioner

## 2020-02-06 ENCOUNTER — Other Ambulatory Visit: Payer: Self-pay

## 2020-02-06 DIAGNOSIS — G8929 Other chronic pain: Secondary | ICD-10-CM

## 2020-02-06 DIAGNOSIS — M79602 Pain in left arm: Secondary | ICD-10-CM | POA: Diagnosis not present

## 2020-02-06 DIAGNOSIS — M5011 Cervical disc disorder with radiculopathy,  high cervical region: Secondary | ICD-10-CM | POA: Diagnosis not present

## 2020-02-06 DIAGNOSIS — M5442 Lumbago with sciatica, left side: Secondary | ICD-10-CM | POA: Insufficient documentation

## 2020-02-06 DIAGNOSIS — M4802 Spinal stenosis, cervical region: Secondary | ICD-10-CM | POA: Diagnosis not present

## 2020-02-06 DIAGNOSIS — M5126 Other intervertebral disc displacement, lumbar region: Secondary | ICD-10-CM | POA: Diagnosis not present

## 2020-02-06 DIAGNOSIS — M5441 Lumbago with sciatica, right side: Secondary | ICD-10-CM | POA: Diagnosis not present

## 2020-02-06 DIAGNOSIS — G9589 Other specified diseases of spinal cord: Secondary | ICD-10-CM | POA: Diagnosis not present

## 2020-02-06 DIAGNOSIS — M50121 Cervical disc disorder at C4-C5 level with radiculopathy: Secondary | ICD-10-CM | POA: Diagnosis not present

## 2020-02-08 DIAGNOSIS — H903 Sensorineural hearing loss, bilateral: Secondary | ICD-10-CM | POA: Diagnosis not present

## 2020-02-08 DIAGNOSIS — H9313 Tinnitus, bilateral: Secondary | ICD-10-CM | POA: Diagnosis not present

## 2020-02-16 DIAGNOSIS — M21371 Foot drop, right foot: Secondary | ICD-10-CM | POA: Diagnosis not present

## 2020-02-16 DIAGNOSIS — G959 Disease of spinal cord, unspecified: Secondary | ICD-10-CM | POA: Diagnosis not present

## 2020-02-22 ENCOUNTER — Other Ambulatory Visit: Payer: Self-pay | Admitting: Neurosurgery

## 2020-02-24 DIAGNOSIS — M5000 Cervical disc disorder with myelopathy, unspecified cervical region: Secondary | ICD-10-CM | POA: Diagnosis not present

## 2020-03-05 ENCOUNTER — Other Ambulatory Visit: Payer: Self-pay

## 2020-03-05 ENCOUNTER — Encounter
Admission: RE | Admit: 2020-03-05 | Discharge: 2020-03-05 | Disposition: A | Discharge status: Home / Self Care | Payer: Medicare Other | Source: Ambulatory Visit | Attending: Neurosurgery | Admitting: Neurosurgery

## 2020-03-05 DIAGNOSIS — Z20822 Contact with and (suspected) exposure to covid-19: Secondary | ICD-10-CM | POA: Diagnosis not present

## 2020-03-05 DIAGNOSIS — Z01818 Encounter for other preprocedural examination: Secondary | ICD-10-CM | POA: Diagnosis not present

## 2020-03-05 DIAGNOSIS — I10 Essential (primary) hypertension: Secondary | ICD-10-CM | POA: Diagnosis not present

## 2020-03-05 HISTORY — DX: Personal history of urinary calculi: Z87.442

## 2020-03-05 LAB — URINALYSIS, ROUTINE W REFLEX MICROSCOPIC
Bilirubin Urine: NEGATIVE
Glucose, UA: NEGATIVE mg/dL
Hgb urine dipstick: NEGATIVE
Ketones, ur: NEGATIVE mg/dL
Leukocytes,Ua: NEGATIVE
Nitrite: NEGATIVE
Protein, ur: NEGATIVE mg/dL
Specific Gravity, Urine: 1.016 (ref 1.005–1.030)
pH: 6 (ref 5.0–8.0)

## 2020-03-05 LAB — SURGICAL PCR SCREEN
MRSA, PCR: NEGATIVE
Staphylococcus aureus: NEGATIVE

## 2020-03-05 LAB — BASIC METABOLIC PANEL
Anion gap: 8 (ref 5–15)
BUN: 19 mg/dL (ref 8–23)
CO2: 28 mmol/L (ref 22–32)
Calcium: 9.2 mg/dL (ref 8.9–10.3)
Chloride: 104 mmol/L (ref 98–111)
Creatinine, Ser: 1.16 mg/dL (ref 0.61–1.24)
GFR calc Af Amer: >60 mL/min (ref >60)
GFR calc non Af Amer: >60 mL/min (ref >60)
Glucose, Bld: 93 mg/dL (ref 70–99)
Potassium: 4.3 mmol/L (ref 3.5–5.1)
Sodium: 140 mmol/L (ref 135–145)

## 2020-03-05 LAB — CBC
HCT: 39.7 % (ref 39.0–52.0)
Hemoglobin: 12.4 g/dL — ABNORMAL LOW (ref 13.0–17.0)
MCH: 25.7 pg — ABNORMAL LOW (ref 26.0–34.0)
MCHC: 31.2 g/dL (ref 30.0–36.0)
MCV: 82.4 fL (ref 80.0–100.0)
Platelets: 250 10*3/uL (ref 150–400)
RBC: 4.82 MIL/uL (ref 4.22–5.81)
RDW: 14.5 % (ref 11.5–15.5)
WBC: 5.9 10*3/uL (ref 4.0–10.5)
nRBC: 0 % (ref 0.0–0.2)

## 2020-03-05 LAB — PROTIME-INR
INR: 1 (ref 0.8–1.2)
Prothrombin Time: 12.5 seconds (ref 11.4–15.2)

## 2020-03-05 LAB — APTT: aPTT: 36 seconds (ref 24–36)

## 2020-03-05 LAB — TYPE AND SCREEN
ABO/RH(D): A POS
Antibody Screen: NEGATIVE

## 2020-03-05 NOTE — Patient Instructions (Signed)
Your procedure is scheduled on: Wednesday 03/14/20.  Report to DAY SURGERY DEPARTMENT LOCATED ON 2ND FLOOR MEDICAL MALL ENTRANCE. To find out your arrival time please call [phone] between 1PM - 3PM on Tuesday 03/13/20.   Remember: Instructions that are not followed completely may result in serious medical risk, up to and including death, or upon the discretion of your surgeon and anesthesiologist your surgery may need to be rescheduled.     __X__ 1. Do not eat food after midnight the night before your procedure.                 No gum chewing or hard candies. You may drink clear liquids up to 2 hours                 before you are scheduled to arrive for your surgery- DO NOT drink clear                 liquids within 2 hours of the start of your surgery.                 Clear Liquids include:  water, apple juice without pulp, clear carbohydrate                 drink such as Clearfast or Gatorade, Black Coffee or Tea (Do not add                 milk or creamer to coffee or tea).  __X__2.  On the morning of surgery brush your teeth with toothpaste and water, you may rinse your mouth with mouthwash if you wish.  Do not swallow any toothpaste or mouthwash.    __X__ 3.  No Alcohol for 24 hours before or after surgery.  __X__ 4.  Do Not Smoke or use e-cigarettes For 24 Hours Prior to Your Surgery.                 Do not use any chewable tobacco products for at least 6 hours prior to                 surgery.  __X__5.  Notify your doctor if there is any change in your medical condition      (cold, fever, infections).      Do NOT wear jewelry, make-up, hairpins, clips or nail polish. Do NOT wear lotions, powders, or perfumes.  Do NOT shave 48 hours prior to surgery. Men may shave face and neck. Do NOT bring valuables to the hospital.     [facility] is not responsible for any belongings or valuables.   Contacts, dentures/partials or body piercings may not be worn into surgery. Bring  a case for your contacts, glasses or hearing aids, a denture cup will be supplied. Leave your suitcase in the car. After surgery it may be brought to your room.   For patients admitted to the hospital, discharge time is determined by your treatment team.    Patients discharged the day of surgery will not be allowed to drive home.     __X__ Take these medicines the morning of surgery with A SIP OF WATER:     1. acetaminophen (TYLENOL) 650 MG CR tablet if needed       __X__ Use CHG Soap as directed  __X__ Stop Blood Thinners: Eliquis 72 hours prior to your surgery. Plavix 7 days prior to surgery.  __X__ Stop Anti-inflammatories 7 days before surgery such as Advil, Ibuprofen, Motrin, BC or  Goodies Powder, Naprosyn, Naproxen, Aleve, Aspirin, Meloxicam. May take Tylenol if needed for pain or discomfort.   __X__Do not start taking any new herbal supplements or vitamins prior to your procedure.    Wear comfortable clothing (specific to your surgery type) to the hospital.  Plan for stool softeners for home use; pain medications have a tendency to cause constipation. You can also help prevent constipation by eating foods high in fiber such as fruits and vegetables and drinking plenty of fluids as your diet allows.  After surgery, you can prevent lung complications by doing breathing exercises.Take deep breaths and cough every 1-2 hours. Your doctor may order a device called an Incentive Spirometer to help you take deep breaths.  Please call the [place] Department at [phone] if you have any questions about these instructions

## 2020-03-12 ENCOUNTER — Other Ambulatory Visit: Payer: Self-pay

## 2020-03-12 ENCOUNTER — Other Ambulatory Visit
Admission: RE | Admit: 2020-03-12 | Discharge: 2020-03-12 | Disposition: A | Discharge status: Home / Self Care | Payer: Medicare Other | Source: Ambulatory Visit | Attending: Neurosurgery | Admitting: Neurosurgery

## 2020-03-12 DIAGNOSIS — Z01812 Encounter for preprocedural laboratory examination: Secondary | ICD-10-CM | POA: Diagnosis not present

## 2020-03-12 DIAGNOSIS — Z20822 Contact with and (suspected) exposure to covid-19: Secondary | ICD-10-CM | POA: Insufficient documentation

## 2020-03-13 LAB — SARS CORONAVIRUS 2 (TAT 6-24 HRS): SARS Coronavirus 2: NEGATIVE

## 2020-03-14 ENCOUNTER — Ambulatory Visit: Payer: Medicare Other | Admitting: Certified Registered Nurse Anesthetist

## 2020-03-14 ENCOUNTER — Ambulatory Visit: Payer: Medicare Other | Admitting: Urgent Care

## 2020-03-14 ENCOUNTER — Encounter: Payer: Self-pay | Admitting: Neurosurgery

## 2020-03-14 ENCOUNTER — Observation Stay
Admission: RE | Admit: 2020-03-14 | Discharge: 2020-03-15 | Disposition: A | Discharge status: Home / Self Care | Payer: Medicare Other | Attending: Neurosurgery | Admitting: Neurosurgery

## 2020-03-14 ENCOUNTER — Ambulatory Visit: Payer: Medicare Other

## 2020-03-14 ENCOUNTER — Encounter
Admission: RE | Disposition: A | Discharge status: Home / Self Care | Payer: Self-pay | Source: Home / Self Care | Attending: Neurosurgery

## 2020-03-14 ENCOUNTER — Other Ambulatory Visit: Payer: Self-pay

## 2020-03-14 DIAGNOSIS — M50023 Cervical disc disorder at C6-C7 level with myelopathy: Secondary | ICD-10-CM | POA: Diagnosis not present

## 2020-03-14 DIAGNOSIS — Z79899 Other long term (current) drug therapy: Secondary | ICD-10-CM | POA: Diagnosis not present

## 2020-03-14 DIAGNOSIS — M4322 Fusion of spine, cervical region: Secondary | ICD-10-CM | POA: Diagnosis not present

## 2020-03-14 DIAGNOSIS — G959 Disease of spinal cord, unspecified: Principal | ICD-10-CM | POA: Diagnosis present

## 2020-03-14 DIAGNOSIS — Z87891 Personal history of nicotine dependence: Secondary | ICD-10-CM | POA: Diagnosis not present

## 2020-03-14 DIAGNOSIS — M50021 Cervical disc disorder at C4-C5 level with myelopathy: Secondary | ICD-10-CM | POA: Diagnosis not present

## 2020-03-14 DIAGNOSIS — Z981 Arthrodesis status: Secondary | ICD-10-CM | POA: Diagnosis not present

## 2020-03-14 DIAGNOSIS — Z419 Encounter for procedure for purposes other than remedying health state, unspecified: Secondary | ICD-10-CM

## 2020-03-14 DIAGNOSIS — I10 Essential (primary) hypertension: Secondary | ICD-10-CM | POA: Insufficient documentation

## 2020-03-14 HISTORY — PX: ANTERIOR CERVICAL DECOMP/DISCECTOMY FUSION: SHX1161

## 2020-03-14 SURGERY — ANTERIOR CERVICAL DECOMPRESSION/DISCECTOMY FUSION 3 LEVELS
Anesthesia: General

## 2020-03-14 MED ORDER — SODIUM CHLORIDE 0.9 % IR SOLN
Status: DC | PRN
  Administered 2020-03-14: 1000 mL

## 2020-03-14 MED ORDER — SODIUM CHLORIDE 0.9 % IV SOLN
INTRAVENOUS | Status: DC | PRN
  Administered 2020-03-14: .1 ug/kg/min via INTRAVENOUS

## 2020-03-14 MED ORDER — SODIUM CHLORIDE 0.9% FLUSH: 3.0000 mL | INTRAVENOUS | Status: DC | PRN

## 2020-03-14 MED ORDER — ACETAMINOPHEN 500 MG PO TABS
1000.0000 mg | ORAL_TABLET | Freq: Four times a day (QID) | ORAL | Status: AC
  Administered 2020-03-14 – 2020-03-15 (×4): 1000 mg via ORAL
  Filled 2020-03-14 (×4): qty 2

## 2020-03-14 MED ORDER — METHOCARBAMOL 1000 MG/10ML IJ SOLN
500.0000 mg | Freq: Four times a day (QID) | INTRAVENOUS | Status: DC | PRN
  Administered 2020-03-14: 500 mg via INTRAVENOUS
  Filled 2020-03-14 (×2): qty 5

## 2020-03-14 MED ORDER — DEXAMETHASONE SODIUM PHOSPHATE 10 MG/ML IJ SOLN
INTRAMUSCULAR | Status: AC
  Filled 2020-03-14: qty 1

## 2020-03-14 MED ORDER — FENTANYL CITRATE (PF) 100 MCG/2ML IJ SOLN
25.0000 ug | INTRAMUSCULAR | Status: DC | PRN
  Administered 2020-03-14 (×3): 50 ug via INTRAVENOUS

## 2020-03-14 MED ORDER — FAMOTIDINE 20 MG PO TABS: 20.0000 mg | ORAL_TABLET | Freq: Once | ORAL | Status: AC

## 2020-03-14 MED ORDER — ACETAMINOPHEN 10 MG/ML IV SOLN
INTRAVENOUS | Status: DC | PRN
  Administered 2020-03-14: 1000 mg via INTRAVENOUS

## 2020-03-14 MED ORDER — MIDAZOLAM HCL 2 MG/2ML IJ SOLN
INTRAMUSCULAR | Status: AC
  Filled 2020-03-14: qty 2

## 2020-03-14 MED ORDER — LIDOCAINE HCL 4 % MT SOLN
OROMUCOSAL | Status: DC | PRN
  Administered 2020-03-14: 4 mL via TOPICAL

## 2020-03-14 MED ORDER — ORAL CARE MOUTH RINSE: 15.0000 mL | Freq: Once | OROMUCOSAL | Status: AC

## 2020-03-14 MED ORDER — SUCCINYLCHOLINE CHLORIDE 200 MG/10ML IV SOSY
PREFILLED_SYRINGE | INTRAVENOUS | Status: AC
  Filled 2020-03-14: qty 10

## 2020-03-14 MED ORDER — MENTHOL 3 MG MT LOZG
1.0000 | LOZENGE | OROMUCOSAL | Status: DC | PRN
  Filled 2020-03-14: qty 9

## 2020-03-14 MED ORDER — PHENOL 1.4 % MT LIQD
1.0000 | OROMUCOSAL | Status: DC | PRN
  Filled 2020-03-14: qty 177

## 2020-03-14 MED ORDER — POTASSIUM CHLORIDE IN NACL 20-0.9 MEQ/L-% IV SOLN
INTRAVENOUS | Status: DC
  Filled 2020-03-14 (×5): qty 1000

## 2020-03-14 MED ORDER — KETAMINE HCL 50 MG/ML IJ SOLN
INTRAMUSCULAR | Status: DC | PRN
  Administered 2020-03-14: 50 mg via INTRAMUSCULAR

## 2020-03-14 MED ORDER — LIDOCAINE HCL (CARDIAC) PF 100 MG/5ML IV SOSY
PREFILLED_SYRINGE | INTRAVENOUS | Status: DC | PRN
  Administered 2020-03-14: 100 mg via INTRAVENOUS

## 2020-03-14 MED ORDER — SODIUM CHLORIDE 0.9% FLUSH: 3.0000 mL | Freq: Two times a day (BID) | INTRAVENOUS | Status: DC

## 2020-03-14 MED ORDER — PROPOFOL 10 MG/ML IV BOLUS
INTRAVENOUS | Status: AC
  Filled 2020-03-14: qty 20

## 2020-03-14 MED ORDER — FAMOTIDINE 20 MG PO TABS
ORAL_TABLET | ORAL | Status: AC
  Administered 2020-03-14: 20 mg via ORAL
  Filled 2020-03-14: qty 1

## 2020-03-14 MED ORDER — ACETAMINOPHEN 325 MG PO TABS
650.0000 mg | ORAL_TABLET | ORAL | Status: DC | PRN
  Filled 2020-03-14: qty 2

## 2020-03-14 MED ORDER — ACETAMINOPHEN 650 MG RE SUPP
650.0000 mg | RECTAL | Status: DC | PRN
  Filled 2020-03-14: qty 1

## 2020-03-14 MED ORDER — ONDANSETRON HCL 4 MG/2ML IJ SOLN
INTRAMUSCULAR | Status: DC | PRN
  Administered 2020-03-14: 4 mg via INTRAVENOUS

## 2020-03-14 MED ORDER — MIDAZOLAM HCL 2 MG/2ML IJ SOLN
INTRAMUSCULAR | Status: DC | PRN
  Administered 2020-03-14: 2 mg via INTRAVENOUS

## 2020-03-14 MED ORDER — ROCURONIUM BROMIDE 100 MG/10ML IV SOLN
INTRAVENOUS | Status: DC | PRN
  Administered 2020-03-14: 10 mg via INTRAVENOUS

## 2020-03-14 MED ORDER — SODIUM CHLORIDE 0.9 % IV SOLN: 250.0000 mL | INTRAVENOUS | Status: DC

## 2020-03-14 MED ORDER — FENTANYL CITRATE (PF) 100 MCG/2ML IJ SOLN
INTRAMUSCULAR | Status: AC
  Filled 2020-03-14: qty 2

## 2020-03-14 MED ORDER — HYDROMORPHONE HCL 1 MG/ML IJ SOLN: 0.5000 mg | INTRAMUSCULAR | Status: DC | PRN

## 2020-03-14 MED ORDER — SENNOSIDES-DOCUSATE SODIUM 8.6-50 MG PO TABS
1.0000 | ORAL_TABLET | Freq: Every evening | ORAL | Status: DC | PRN
  Filled 2020-03-14: qty 1

## 2020-03-14 MED ORDER — FENTANYL CITRATE (PF) 100 MCG/2ML IJ SOLN
INTRAMUSCULAR | Status: AC
Start: 2020-03-14 — End: 2020-03-15
  Filled 2020-03-14: qty 2

## 2020-03-14 MED ORDER — OXYCODONE HCL 5 MG PO TABS
5.0000 mg | ORAL_TABLET | ORAL | Status: DC | PRN
  Filled 2020-03-14: qty 1

## 2020-03-14 MED ORDER — LIDOCAINE HCL (PF) 2 % IJ SOLN
INTRAMUSCULAR | Status: AC
  Filled 2020-03-14: qty 5

## 2020-03-14 MED ORDER — SUCCINYLCHOLINE CHLORIDE 20 MG/ML IJ SOLN
INTRAMUSCULAR | Status: DC | PRN
  Administered 2020-03-14: 120 mg via INTRAVENOUS

## 2020-03-14 MED ORDER — DEXAMETHASONE SODIUM PHOSPHATE 10 MG/ML IJ SOLN
INTRAMUSCULAR | Status: DC | PRN
  Administered 2020-03-14: 10 mg via INTRAVENOUS

## 2020-03-14 MED ORDER — PROMETHAZINE HCL 25 MG/ML IJ SOLN: 6.2500 mg | INTRAMUSCULAR | Status: DC | PRN

## 2020-03-14 MED ORDER — PROPOFOL 500 MG/50ML IV EMUL
INTRAVENOUS | Status: AC
  Filled 2020-03-14: qty 100

## 2020-03-14 MED ORDER — PROPOFOL 10 MG/ML IV BOLUS
INTRAVENOUS | Status: DC | PRN
  Administered 2020-03-14: 170 mg via INTRAVENOUS

## 2020-03-14 MED ORDER — METHOCARBAMOL 500 MG PO TABS: 500.0000 mg | ORAL_TABLET | Freq: Four times a day (QID) | ORAL | Status: DC | PRN

## 2020-03-14 MED ORDER — CHLORHEXIDINE GLUCONATE 0.12 % MT SOLN: 15.0000 mL | Freq: Once | OROMUCOSAL | Status: AC

## 2020-03-14 MED ORDER — REMIFENTANIL HCL 1 MG IV SOLR
INTRAVENOUS | Status: AC
  Filled 2020-03-14: qty 2000

## 2020-03-14 MED ORDER — CHLORHEXIDINE GLUCONATE 0.12 % MT SOLN
OROMUCOSAL | Status: AC
  Administered 2020-03-14: 15 mL via OROMUCOSAL
  Filled 2020-03-14: qty 15

## 2020-03-14 MED ORDER — FENTANYL CITRATE (PF) 250 MCG/5ML IJ SOLN
INTRAMUSCULAR | Status: DC | PRN
Start: 2020-03-14 — End: 2020-03-14
  Administered 2020-03-14: 50 ug via INTRAVENOUS

## 2020-03-14 MED ORDER — SODIUM CHLORIDE 0.9 % IV SOLN
INTRAVENOUS | Status: DC | PRN
  Administered 2020-03-14: 40 ug/min via INTRAVENOUS

## 2020-03-14 MED ORDER — LISINOPRIL 10 MG PO TABS
10.0000 mg | ORAL_TABLET | Freq: Every day | ORAL | Status: DC
  Administered 2020-03-14 – 2020-03-15 (×2): 10 mg via ORAL
  Filled 2020-03-14 (×2): qty 1

## 2020-03-14 MED ORDER — CEFAZOLIN SODIUM-DEXTROSE 2-4 GM/100ML-% IV SOLN
INTRAVENOUS | Status: AC
  Filled 2020-03-14: qty 100

## 2020-03-14 MED ORDER — BUPIVACAINE-EPINEPHRINE 0.5% -1:200000 IJ SOLN
INTRAMUSCULAR | Status: DC | PRN
  Administered 2020-03-14: 7 mL

## 2020-03-14 MED ORDER — ONDANSETRON HCL 4 MG/2ML IJ SOLN: 4.0000 mg | Freq: Four times a day (QID) | INTRAMUSCULAR | Status: DC | PRN

## 2020-03-14 MED ORDER — LACTATED RINGERS IV SOLN: INTRAVENOUS | Status: DC

## 2020-03-14 MED ORDER — ONDANSETRON HCL 4 MG PO TABS: 4.0000 mg | ORAL_TABLET | Freq: Four times a day (QID) | ORAL | Status: DC | PRN

## 2020-03-14 MED ORDER — ACETAMINOPHEN 10 MG/ML IV SOLN
INTRAVENOUS | Status: AC
  Filled 2020-03-14: qty 100

## 2020-03-14 MED ORDER — ROCURONIUM BROMIDE 10 MG/ML (PF) SYRINGE
PREFILLED_SYRINGE | INTRAVENOUS | Status: AC
  Filled 2020-03-14: qty 10

## 2020-03-14 MED ORDER — SIMVASTATIN 20 MG PO TABS
20.0000 mg | ORAL_TABLET | Freq: Every day | ORAL | Status: DC
  Administered 2020-03-14: 20 mg via ORAL
  Filled 2020-03-14: qty 1

## 2020-03-14 MED ORDER — GABAPENTIN 300 MG PO CAPS
300.0000 mg | ORAL_CAPSULE | Freq: Every day | ORAL | Status: DC
  Administered 2020-03-14: 300 mg via ORAL
  Filled 2020-03-14: qty 1

## 2020-03-14 MED ORDER — PROPOFOL 500 MG/50ML IV EMUL
INTRAVENOUS | Status: AC
  Filled 2020-03-14: qty 50

## 2020-03-14 MED ORDER — CEFAZOLIN SODIUM-DEXTROSE 2-4 GM/100ML-% IV SOLN
2.0000 g | Freq: Once | INTRAVENOUS | Status: AC
  Administered 2020-03-14: 2 g via INTRAVENOUS

## 2020-03-14 MED ORDER — PHENYLEPHRINE HCL (PRESSORS) 10 MG/ML IV SOLN
INTRAVENOUS | Status: DC | PRN
  Administered 2020-03-14 (×3): 100 ug via INTRAVENOUS

## 2020-03-14 MED ORDER — THROMBIN 5000 UNITS EX SOLR
CUTANEOUS | Status: DC | PRN
  Administered 2020-03-14: 5000 [IU] via TOPICAL

## 2020-03-14 MED ORDER — PROPOFOL 500 MG/50ML IV EMUL
INTRAVENOUS | Status: DC | PRN
  Administered 2020-03-14: 150 ug/kg/min via INTRAVENOUS

## 2020-03-14 MED ORDER — ONDANSETRON HCL 4 MG/2ML IJ SOLN
INTRAMUSCULAR | Status: AC
  Filled 2020-03-14: qty 2

## 2020-03-14 SURGICAL SUPPLY — 58 items
BULB RESERV EVAC DRAIN JP 100C (MISCELLANEOUS) IMPLANT
BUR NEURO DRILL SOFT 3.0X3.8M (BURR) ×2 IMPLANT
CANISTER SUCT 1200ML W/VALVE (MISCELLANEOUS) ×4 IMPLANT
CHLORAPREP W/TINT 26 (MISCELLANEOUS) ×2 IMPLANT
COUNTER NEEDLE 20/40 LG (NEEDLE) ×2 IMPLANT
COVER LIGHT HANDLE STERIS (MISCELLANEOUS) ×4 IMPLANT
COVER WAND RF STERILE (DRAPES) ×2 IMPLANT
DERMABOND ADVANCED (GAUZE/BANDAGES/DRESSINGS) ×1
DERMABOND ADVANCED .7 DNX12 (GAUZE/BANDAGES/DRESSINGS) ×1 IMPLANT
DRAIN CHANNEL JP 10F RND 20C F (MISCELLANEOUS) IMPLANT
DRAPE C-ARM 42X72 X-RAY (DRAPES) ×4 IMPLANT
DRAPE LAPAROTOMY 77X122 PED (DRAPES) ×2 IMPLANT
DRAPE MICROSCOPE SPINE 48X150 (DRAPES) ×2 IMPLANT
DRAPE SURG 17X11 SM STRL (DRAPES) ×8 IMPLANT
ELECT CAUTERY BLADE TIP 2.5 (TIP) ×2
ELECTRODE CAUTERY BLDE TIP 2.5 (TIP) ×1 IMPLANT
FEE INTRAOP MONITOR IMPULS NCS (MISCELLANEOUS) ×1 IMPLANT
FRAME EYE SHIELD (PROTECTIVE WEAR) ×2 IMPLANT
GAUZE SPONGE 4X4 12PLY STRL (GAUZE/BANDAGES/DRESSINGS) ×2 IMPLANT
GLOVE SURG SYN 7.0 (GLOVE) ×4 IMPLANT
GLOVE SURG SYN 8.5  E (GLOVE) ×3
GLOVE SURG SYN 8.5 E (GLOVE) ×3 IMPLANT
GOWN SRG XL LVL 3 NONREINFORCE (GOWNS) ×1 IMPLANT
GOWN STRL NON-REIN TWL XL LVL3 (GOWNS) ×1
GOWN STRL REUS W/ TWL LRG LVL3 (GOWN DISPOSABLE) ×1 IMPLANT
GOWN STRL REUS W/ TWL XL LVL3 (GOWN DISPOSABLE) ×1 IMPLANT
GOWN STRL REUS W/TWL LRG LVL3 (GOWN DISPOSABLE) ×1
GOWN STRL REUS W/TWL XL LVL3 (GOWN DISPOSABLE) ×1
GRADUATE 1200CC STRL 31836 (MISCELLANEOUS) ×2 IMPLANT
INTRAOP MONITOR FEE IMPULS NCS (MISCELLANEOUS) ×1
INTRAOP MONITOR FEE IMPULSE (MISCELLANEOUS) ×1
KIT TURNOVER KIT A (KITS) ×2 IMPLANT
MARKER SKIN DUAL TIP RULER LAB (MISCELLANEOUS) ×4 IMPLANT
NEEDLE HYPO 22GX1.5 SAFETY (NEEDLE) ×2 IMPLANT
NS IRRIG 1000ML POUR BTL (IV SOLUTION) ×2 IMPLANT
PACK LAMINECTOMY NEURO (CUSTOM PROCEDURE TRAY) ×2 IMPLANT
PAD ARMBOARD 7.5X6 YLW CONV (MISCELLANEOUS) ×2 IMPLANT
PIN CASPAR 14 (PIN) ×1 IMPLANT
PIN CASPAR 14MM (PIN) ×2
PLATE ANT CERV XTEND 3 LV 51 (Plate) ×2 IMPLANT
PUTTY DBX 1CC (Putty) ×4 IMPLANT
PUTTY DBX 1CC DEPUY (Putty) ×2 IMPLANT
SCREW VAR 4.2 XD SELF DRILL 16 (Screw) ×12 IMPLANT
SCREW XTEND SELF DRILL 4.6X16 (Screw) ×4 IMPLANT
SPACER C HEDRON 12X14 6 7D (Spacer) ×2 IMPLANT
SPACER C HEDRON 12X14 7M 7D (Spacer) ×4 IMPLANT
SPOGE SURGIFLO 8M (HEMOSTASIS) ×1
SPONGE KITTNER 5P (MISCELLANEOUS) ×2 IMPLANT
SPONGE SURGIFLO 8M (HEMOSTASIS) ×1 IMPLANT
STAPLER SKIN PROX 35W (STAPLE) IMPLANT
STRIP CLOSURE SKIN 1/2X4 (GAUZE/BANDAGES/DRESSINGS) IMPLANT
SUT V-LOC 90 ABS DVC 3-0 CL (SUTURE) ×2 IMPLANT
SUT VIC AB 3-0 SH 8-18 (SUTURE) ×2 IMPLANT
SYR 30ML LL (SYRINGE) ×2 IMPLANT
TAPE CLOTH 3X10 WHT NS LF (GAUZE/BANDAGES/DRESSINGS) ×2 IMPLANT
TOWEL OR 17X26 4PK STRL BLUE (TOWEL DISPOSABLE) ×8 IMPLANT
TRAY FOLEY MTR SLVR 16FR STAT (SET/KITS/TRAYS/PACK) IMPLANT
TUBING CONNECTING 10 (TUBING) ×2 IMPLANT

## 2020-03-14 NOTE — H&P (Signed)
I have reviewed and confirmed my history and physical from 02/16/2020 with no additions or changes. Plan for C4-7 ACDF.  Risks and benefits reviewed.  Heart sounds normal no MRG. Chest Clear to Auscultation Bilaterally.

## 2020-03-14 NOTE — Transfer of Care (Signed)
Immediate Anesthesia Transfer of Care Note  Patient: Joshua [name]  Procedure(s) Performed: ANTERIOR CERVICAL DECOMPRESSION/DISCECTOMY FUSION 3 LEVELS C4-7 (N/A )  Patient Location: PACU  Anesthesia Type:General  Level of Consciousness: drowsy  Airway & Oxygen Therapy: Patient Spontanous Breathing and Patient connected to face mask oxygen  Post-op Assessment: Report given to RN and Post -op Vital signs reviewed and stable  Post vital signs: Reviewed and stable  Last Vitals:  Vitals Value Taken Time  BP 108/58 03/14/20 1310  Temp    Pulse 75 03/14/20 1312  Resp 9 03/14/20 1312  SpO2 99 % 03/14/20 1312  Vitals shown include unvalidated device data.  Last Pain:  Vitals:   03/14/20 0845  TempSrc: Temporal  PainSc: 0-No pain         Complications: No complications documented.

## 2020-03-14 NOTE — Anesthesia Procedure Notes (Signed)
Procedure Name: Intubation Date/Time: 03/14/2020 10:07 AM Performed by: Eben Burow, CRNA Pre-anesthesia Checklist: Patient identified, Emergency Drugs available, Suction available and Patient being monitored Patient Re-evaluated:Patient Re-evaluated prior to induction Oxygen Delivery Method: Circle system utilized Preoxygenation: Pre-oxygenation with 100% oxygen Induction Type: IV induction Ventilation: Mask ventilation without difficulty Laryngoscope Size: McGraph and 4 Grade View: Grade I Tube type: Oral Tube size: 7.5 mm Number of attempts: 1 Airway Equipment and Method: Stylet,  Video-laryngoscopy and LTA kit utilized Placement Confirmation: ETT inserted through vocal cords under direct vision,  positive ETCO2 and breath sounds checked- equal and bilateral Secured at: 23 cm Tube secured with: Tape Dental Injury: Teeth and Oropharynx as per pre-operative assessment  Comments: Soft bite blocks placed bilateral sides of mouth between upper and lower teeth

## 2020-03-14 NOTE — Op Note (Signed)
Indications: Mr. Cortopassi is a 64 yo male who presented with cervical myelopathy.  Due to worsening symptoms, surgery was recommended.  Findings: cervical stenosis  Preoperative Diagnosis: Cervical myelopathy G95.9 Postoperative Diagnosis: same   EBL: 50 ml IVF: 500 ml Drains: none Disposition: Extubated and Stable to PACU Complications: none  No foley catheter was placed.   Preoperative Note:   Risks of surgery discussed include: infection, bleeding, stroke, coma, death, paralysis, CSF leak, nerve/spinal cord injury, numbness, tingling, weakness, complex regional pain syndrome, recurrent stenosis and/or disc herniation, vascular injury, development of instability, neck/back pain, need for further surgery, persistent symptoms, development of deformity, and the risks of anesthesia. The patient understood these risks and agreed to proceed.  Operative Note:   Operative Procedure: 1. Anterior Cervical Discectomy C4-5 including bilateral foraminotomies and end plate preparation  2. Anterior Cervical Discectomy C5-6 including bilateral foraminotomies and end plate preparation  3. Anterior Cervical Discectomy C6-7 including bilateral foraminotomies and end plate preparation 4. Anterior Spinal Instrumentation C4 to 7 using Globus Xtend 5. Anterior arthrodesis from C4 to C7 with placement of biomechanical devices at C4-5, C5-6, and C6-7  6. Use of the operative microscope 7. Use of intraoperative flouroscopy  PROCEDURE IN DETAIL: After obtaining informed consent, the patient taken to the operating room, placed in supine position, general anesthesia induced.  The patient had a small shoulder roll placed behind their shoulders.  The patient received preop antibiotics and IV Decadron.  The patient had a neck incision outlined, was prepped and draped in usual sterile fashion. The incision was injected with local anesthetic.   An incision was opened, dissection taken down medial to the carotid  artery and jugular vein, lateral to the trachea and esophagus.  The prevertebral fascia identified and a localizing x-ray demonstrated the correct level.  The longus colli were dissected laterally, and self-retaining retractors placed to open the operative field. The microscope was then brought into the field.  With this complete, distractor pins were placed in the vertebral bodies of C4, C6, and C7. The distractor was placed from C4-6, and the anuli at C4-5 and C5-6 were opened using a bovie.  Curettes and pituitary rongeurs used to remove the majority of disk, then the drill was used to remove the posterior osteophyte and begin the foraminotomies. The nerve hook was used to elevate the posterior longitudinal ligament, which was then removed with Kerrison rongeurs. The microblunt nerve hook could be passed out the foramen bilateral at each level.   Meticulous hemostasis obtained. A biomechanical device (Globus Hedron 7 mm height x 14 mm width by 12 mm depth) was placed at C4/5. A second biomechanical device (Globus Hedron 6 mm height x 14 mm width by 12 mm depth)  was placed at C5/6. Each device had been filled with allograft for aid in arthrodesis.  The caspar distractor was removed and placed at C6-7. The pin at C4 was removed and bone wax used for hemostasis. The C6-7 disc was opened with the bovie. Curettes and pituitary rongeurs used to remove the majority of disk, then the drill was used to remove the posterior osteophyte and begin the foraminotomies. The nerve hook was used to elevate the posterior longitudinal ligament, which was then removed with Kerrison rongeurs. The microblunt nerve hook could be passed out the foramen bilateral at each level.   Meticulous hemostasis obtained.  A biomechanical device (Globus Hedron 7 mm height x 14 mm width by 12 mm depth) was placed at C6/7.  A four segment, three level plate (51 mm Globus Xtend) was chosen.  Two screws placed in the vertebral bodies of all  four segments, respectively making sure the screws were behind the locking mechanism.  Final AP and lateral radiographs were taken.   With everything in good position, the wound was irrigated copiously with bacitracin-containing solution and meticulous hemostasis obtained.  Wound was closed in 2 layers using interrupted inverted 3-0 Vicryl sutures.  The wound was dressed with dermabond, the head of bed at 30 degrees, taken to recovery room in stable condition.  No new postop neurological deficits were identified..  All counts were correct at the end of the case.  I performed the entire procedure with the assistance of Liliane Bade PA as an Pensions consultant.  Meade Maw MD

## 2020-03-14 NOTE — Anesthesia Preprocedure Evaluation (Signed)
Anesthesia Evaluation  Patient identified by MRN, date of birth, ID band Patient awake    Reviewed: Allergy & Precautions, NPO status   History of Anesthesia Complications Negative for: history of anesthetic complications  Airway Mallampati: I       Dental  (+) Teeth Intact, Dental Advidsory Given, Caps Permanent bridge on bottom right:   Pulmonary shortness of breath and with exertion, sleep apnea , neg COPD, neg recent URI, former smoker,    breath sounds clear to auscultation       Cardiovascular Exercise Tolerance: Good hypertension, Pt. on medications (-) angina+ Peripheral Vascular Disease  (-) Past MI and (-) Cardiac Stents (-) dysrhythmias (-) Valvular Problems/Murmurs Rhythm:Regular     Neuro/Psych neg Seizures  Neuromuscular disease negative psych ROS   GI/Hepatic negative GI ROS, Neg liver ROS,   Endo/Other  negative endocrine ROS  Renal/GU Renal disease (kidney stones)     Musculoskeletal negative musculoskeletal ROS (+)   Abdominal Normal abdominal exam  (+)   Peds  Hematology negative hematology ROS (+)   Anesthesia Other Findings Past Medical History: No date: Allergy No date: Dyspnea     Comment:  former smoker. only doe No date: History of kidney stones No date: Peripheral vascular disease (HCC)   Reproductive/Obstetrics                             Anesthesia Physical  Anesthesia Plan  ASA: II  Anesthesia Plan: General   Post-op Pain Management:    Induction: Intravenous  PONV Risk Score and Plan: 2 and Ondansetron, Dexamethasone, Promethazine, Midazolam and Treatment may vary due to age or medical condition  Airway Management Planned: Oral ETT  Additional Equipment:   Intra-op Plan:   Post-operative Plan: Extubation in OR  Informed Consent: I have reviewed the patients History and Physical, chart, labs and discussed the procedure including the risks,  benefits and alternatives for the proposed anesthesia with the patient or authorized representative who has indicated his/her understanding and acceptance.       Plan Discussed with: CRNA  Anesthesia Plan Comments:         Anesthesia Quick Evaluation

## 2020-03-15 ENCOUNTER — Encounter: Payer: Self-pay | Admitting: Neurosurgery

## 2020-03-15 ENCOUNTER — Observation Stay: Payer: Medicare Other

## 2020-03-15 DIAGNOSIS — G959 Disease of spinal cord, unspecified: Secondary | ICD-10-CM | POA: Diagnosis not present

## 2020-03-15 DIAGNOSIS — Z79899 Other long term (current) drug therapy: Secondary | ICD-10-CM | POA: Diagnosis not present

## 2020-03-15 DIAGNOSIS — I10 Essential (primary) hypertension: Secondary | ICD-10-CM | POA: Diagnosis not present

## 2020-03-15 DIAGNOSIS — Z981 Arthrodesis status: Secondary | ICD-10-CM | POA: Diagnosis not present

## 2020-03-15 DIAGNOSIS — Z87891 Personal history of nicotine dependence: Secondary | ICD-10-CM | POA: Diagnosis not present

## 2020-03-15 DIAGNOSIS — M4322 Fusion of spine, cervical region: Secondary | ICD-10-CM | POA: Diagnosis not present

## 2020-03-15 MED ORDER — METHOCARBAMOL 500 MG PO TABS: 500.0000 mg | ORAL_TABLET | Freq: Four times a day (QID) | ORAL | 0 refills | Status: DC | PRN

## 2020-03-15 MED ORDER — OXYCODONE HCL 5 MG PO TABS
5.0000 mg | ORAL_TABLET | ORAL | 0 refills | Status: AC | PRN
Start: 2020-03-15 — End: 2020-03-22

## 2020-03-15 MED ORDER — ASPIRIN EC 81 MG PO TBEC: 81.0000 mg | DELAYED_RELEASE_TABLET | Freq: Every day | ORAL | 11 refills | Status: DC

## 2020-03-15 MED ORDER — SENNOSIDES-DOCUSATE SODIUM 8.6-50 MG PO TABS: 1.0000 | ORAL_TABLET | Freq: Every evening | ORAL | 0 refills | Status: DC | PRN

## 2020-03-15 NOTE — Progress Notes (Signed)
Per attending MD Izora Ribas, no potassium level is needed this AM. Will continue to monitor.

## 2020-03-15 NOTE — Progress Notes (Signed)
    Attending Progress Note  History: Joshua [name] is here for cervical myelopathy.  He is doing well postop.  No new complaints.  Physical Exam: Vitals:   03/14/20 2320 03/15/20 0427  BP: (!) 141/68 (!) 117/54  Pulse: 79 69  Resp: 20 20  Temp: 98.3 F (36.8 C) 97.7 F (36.5 C)  SpO2: 98% 97%    AA Ox3 CNI  Strength:5/5 throughout BUE and BLE  Numbness in L arm improved.  Data:  No results for input(s): NA, K, CL, CO2, BUN, CREATININE, LABGLOM, GLUCOSE, CALCIUM in the last 168 hours. No results for input(s): AST, ALT, ALKPHOS in the last 168 hours.  Invalid input(s): TBILI   No results for input(s): WBC, HGB, HCT, PLT in the last 168 hours. No results for input(s): APTT, INR in the last 168 hours.       Other tests/results: none  Assessment/Plan:  [name] is doing well on POD1 from ACDF C4-7.  - mobilize - pain control - DVT prophylaxis - check xrays today  Meade Maw MD, [facility] Department of Neurosurgery

## 2020-03-15 NOTE — Discharge Summary (Signed)
Physician Discharge Summary  Patient ID: Joshua [name] MRN: [id number] DOB/AGE: 1956-05-05 64 y.o.  Admit date: 03/14/2020 Discharge date: 03/15/2020  Admission Diagnoses: cervical myelopathy  Discharge Diagnoses:  Active Problems:   Cervical myelopathy [facility])   Discharged Condition: good  Hospital Course: Joshua [name] was admitted for management of cervical myelopathy.  Surgery went well and he was instable neurologic condition on POD1.  He ambulated and was felt to be stable for discharge.  Consults: None  Significant Diagnostic Studies: radiology: cervical spine xrays show good placement of plate  Treatments: surgery: C4-7 ACDF  Discharge Exam: Blood pressure (!) 141/70, pulse 67, temperature 97.9 F (36.6 C), temperature source Oral, resp. rate 16, height 6' (1.829 m), weight 97 kg, SpO2 99 %. General appearance: alert and cooperative   He is doing well.  5/5 throughout BUE and BLE  Incision c/d./i with trachea midline  Disposition: Discharge disposition: 01-Home or Self Care       Discharge Instructions    Incentive spirometry RT   Complete by: As directed      Allergies as of 03/15/2020   No Known Allergies     Medication List    STOP taking these medications   apixaban 5 MG Tabs tablet Commonly known as: ELIQUIS   clopidogrel 75 MG tablet Commonly known as: PLAVIX     TAKE these medications   acetaminophen 650 MG CR tablet Commonly known as: TYLENOL Take 1,300 mg by mouth every 8 (eight) hours as needed for pain.   aspirin EC 81 MG tablet Take 1 tablet (81 mg total) by mouth daily. Restart 5 days after surgery What changed: additional instructions   gabapentin 300 MG capsule Commonly known as: NEURONTIN Take 1 capsule (300 mg total) by mouth at bedtime.   lisinopril 10 MG tablet Commonly known as: ZESTRIL Take 1 tablet (10 mg total) by mouth daily.   methocarbamol 500 MG tablet Commonly known as: ROBAXIN Take 1 tablet (500 mg  total) by mouth every 6 (six) hours as needed for muscle spasms.   oxyCODONE 5 MG immediate release tablet Commonly known as: Oxy IR/ROXICODONE Take 1 tablet (5 mg total) by mouth every 3 (three) hours as needed for up to 7 days for moderate pain ((score 4 to 6)).   senna-docusate 8.6-50 MG tablet Commonly known as: Senokot-S Take 1 tablet by mouth at bedtime as needed for mild constipation.   simvastatin 20 MG tablet Commonly known as: ZOCOR Take 1 tablet (20 mg total) by mouth daily at 6 PM.      He can restart his apixiban (eliquis) and clopidogrel (plavix) 14 days after surgery.     Follow-up Information    Joshua Face, NP Follow up in 2 week(s).   Specialty: Neurosurgery Why: already scheduled Contact information: [place] [address] [phone]               Signed: Meade [name] 03/15/2020, 12:10 PM

## 2020-03-15 NOTE — Progress Notes (Signed)
Patient discharged home , picked up via w/c with his girlfriend whom drove to pick him up , IV removed from LFA. Discharge packet and instructions went over with patient

## 2020-03-15 NOTE — Discharge Instructions (Signed)
Your surgeon has performed an operation on your cervical spine (neck) to relieve pressure on the spinal cord and/or nerves. This involved making an incision in the front of your neck and removing one or more of the discs that support your spine. Next, a small piece of bone, a titanium plate, and screws were used to fuse two or more of the vertebrae (bones) together.  The following are instructions to help in your recovery once you have been discharged from the hospital. Even if you feel well, it is important that you follow these activity guidelines. If you do not let your neck heal properly from the surgery, you can increase the chance of return of your symptoms and other complications.  * Do not take anti-inflammatory medications for 3 months after surgery (naproxen [Aleve], ibuprofen [Advil, Motrin], celecoxib [Celebrex], etc.). These medications can prevent your bones from healing properly.  Activity    No bending, lifting, or twisting ("BLT"). Avoid lifting objects heavier than 10 pounds (gallon milk jug).  Where possible, avoid household activities that involve lifting, bending, reaching, pushing, or pulling such as laundry, vacuuming, grocery shopping, and childcare. Try to arrange for help from friends and family for these activities while your back heals.  Increase physical activity slowly as tolerated.  Taking short walks is encouraged, but avoid strenuous exercise. Do not jog, run, bicycle, lift weights, or participate in any other exercises unless specifically allowed by your doctor.  Talk to your doctor before resuming sexual activity.  You should not drive until cleared by your doctor.  Until released by your doctor, you should not return to work or school.  You should rest at home and let your body heal.   You may shower one day after your surgery.  After showering, lightly dab your incision dry. Do not take a tub bath or go swimming until approved by your doctor at your follow-up  appointment.  If your doctor ordered a cervical collar (neck brace) for you, you should wear it whenever you are out of bed. You may remove it when lying down or sleeping, but you should wear it at all other times. Not all neck surgeries require a cervical collar.  If you smoke, we strongly recommend that you quit.  Smoking has been proven to interfere with normal bone healing and will dramatically reduce the success rate of your surgery. Please contact [facility] (800-QUIT-NOW) and use the resources at www.[facility].com for assistance in stopping smoking.  Surgical Incision   If you have a dressing on your incision, you may remove it two days after your surgery. Keep your incision area clean and dry.  If you have staples or stitches on your incision, you should have a follow up scheduled for removal. If you do not have staples or stitches, you will have steri-strips (small pieces of surgical tape) or Dermabond glue. The steri-strips/glue should begin to peel away within about a week (it is fine if the steri-strips fall off before then). If the strips are still in place one week after your surgery, you may gently remove them.  Diet           You may return to your usual diet. However, you may experience discomfort when swallowing in the first month after your surgery. This is normal. You may find that softer foods are more comfortable for you to swallow. Be sure to stay hydrated.  When to Contact us  You may experience pain in your neck and/or pain between your shoulder  blades. This is normal and should improve in the next few weeks with the help of pain medication, muscle relaxers, and rest. Some patients report that a warm compress on the back of the neck or between the shoulder blades helps.  However, should you experience any of the following, contact us immediately: . New numbness or weakness . Pain that is progressively getting worse, and is not relieved by your pain medication, muscle  relaxers, rest, and warm compresses . Bleeding, redness, swelling, pain, or drainage from surgical incision . Chills or flu-like symptoms . Fever greater than 101.0 F (38.3 C) . Inability to eat, drink fluids, or take medications . Problems with bowel or bladder functions . Difficulty breathing or shortness of breath . Warmth, tenderness, or swelling in your calf Contact Information . During office hours (Monday-Friday 9 am to 5 pm), please call your physician at [phone] and ask for Berdine Addison . After hours and weekends, please call the answering service at [phone] . For a life-threatening emergency, call 911

## 2020-03-15 NOTE — Anesthesia Postprocedure Evaluation (Signed)
Anesthesia Post Note  Patient: Joshua [name]  Procedure(s) Performed: ANTERIOR CERVICAL DECOMPRESSION/DISCECTOMY FUSION 3 LEVELS C4-7 (N/A )  Patient location during evaluation: PACU Anesthesia Type: General Level of consciousness: awake and alert Pain management: pain level controlled Vital Signs Assessment: post-procedure vital signs reviewed and stable Respiratory status: spontaneous breathing, nonlabored ventilation, respiratory function stable and patient connected to nasal cannula oxygen Cardiovascular status: blood pressure returned to baseline and stable Postop Assessment: no apparent nausea or vomiting Anesthetic complications: no   No complications documented.   Last Vitals:  Vitals:   03/14/20 2320 03/15/20 0427  BP: (!) 141/68 (!) 117/54  Pulse: 79 69  Resp: 20 20  Temp: 36.8 C 36.5 C  SpO2: 98% 97%    Last Pain:  Vitals:   03/15/20 0427  TempSrc: Oral  PainSc:                  Martha Clan

## 2020-03-15 NOTE — Evaluation (Addendum)
Occupational Therapy Evaluation Patient Details Name: Joshua [name] MRN: [id number] DOB: 1956/07/05 Today's Date: 03/15/2020    History of Present Illness Pt is 64 y/o M with h/o cervical myelopathy now POD1 from ACDF C4-7   Clinical Impression   Pt was seen for OT evaluation this date POD #1 from above named sx. Prior to hospital admission, pt was Indep with ADLs and MOD I with fxl mobility with intermittent use of SPC. Pt lives in [facility] with ramped entrance (installed for his mother who passed earlier this year). OT first educates pt re: Aspen collar wear and donning as well as general movement precautions. Pt with moderate understanding. Pt received in bed very motivated to get moving and hoping to go home soon. OT first assists pt with log roll technique to come to EOB sitting for which pt performs with MOD I. Pt demos G sitting balance and OT educates re: perform cross leg technique in sitting for LB dressing to adhere to precautions (R LE requiring increased time versus L, pt reports h/o "dragging" R LE at baseline). Pt ultimately performs with MOD I. Pt performs fxl mobility with no AD for HH distance with SBA, somewhat odd pattern noted that pt reports is actually improved versus baseline. Ultimately pt requires cuing for adjusting ADL routines to adhere to precautions, but no physical assist. Do not anticipate need for continued OT in acute setting, but recommend f/u in home setting to ensure that pt is safely adhering to precautions with ADLs in the natural environment.     Follow Up Recommendations  Home health OT    Equipment Recommendations  None recommended by OT    Recommendations for Other Services       Precautions / Restrictions Precautions Precautions: Cervical;Fall Restrictions Weight Bearing Restrictions: No Other Position/Activity Restrictions: No bending, lifting >10#, twisting, arching cervical region      Mobility Bed Mobility Overal bed mobility: Modified  Independent             General bed mobility comments: not performed as pt seated in chair upon arrival  Transfers Overall transfer level: Modified independent Equipment used: None             General transfer comment: required no physical assistance and noted to use BUE on armrests to perform    Balance Overall balance assessment: Mild deficits observed, not formally tested                                         ADL either performed or assessed with clinical judgement   ADL Overall ADL's : Modified independent                                       General ADL Comments: increased time for LB ADLs, but overall able to perform w/o assist, but with cues to attend to precautions/restrictions. OT educates re: restrictions and performing LB dsg using cross-leg technique as preferred method, if pt feels he needs to bed at waist to perform, he is advised to keep aspen collar on. Could benefit from f/u with [place] in the natural environment     Vision Patient Visual Report: No change from baseline       Perception     Praxis      Pertinent Vitals/Pain Pain Assessment:  No/denies pain     Hand Dominance Right   Extremity/Trunk Assessment Upper Extremity Assessment Upper Extremity Assessment: Overall WFL for tasks assessed (grossly 4- to 4/5 bilaterally)   Lower Extremity Assessment Lower Extremity Assessment: Overall WFL for tasks assessed (grossly 4/5 bilaterally)       Communication Communication Communication: No difficulties   Cognition Arousal/Alertness: Awake/alert Behavior During Therapy: WFL for tasks assessed/performed Overall Cognitive Status: Within Functional Limits for tasks assessed                                     General Comments  noted equal coordination of UE and LE; pt reports slightly better sensation on L side    Exercises Exercises: Other exercises Other Exercises Other Exercises: OT  facilitates ed re: donning Aspen collar, when to wear and precautions post op'ly. Pt with moderate understanding, could benefit from f/u in the natural environment from [facility] therapy. Other Exercises: pt educated on usage of aspen collar and pt able to don/doff independently   Shoulder Instructions      Home Living Family/patient expects to be discharged to:: Private residence Living Arrangements: Alone Available Help at Discharge: Family;Friend(s);Available PRN/intermittently Type of Home: House Home Access: Ramped entrance     Home Layout: One level     Bathroom Shower/Tub: Tub/shower unit         Home Equipment: Environmental consultant - 2 wheels;Cane - single point;Wheelchair - [employer];Shower seat;Walker - 4 wheels   Additional Comments: reports much of the equipment was his mom's as he was caring for her and she passed in Feb of this year (2021)      Prior Functioning/Environment Level of Independence: Independent with assistive device(s)        Comments: Reports occasional/intermittent use of SPC for mobility. Endorses 1 fall in 2021 resulting in black eye and "road rash" on the R side of his face.        OT Problem List: Impaired balance (sitting and/or standing);Decreased knowledge of precautions      OT Treatment/Interventions: Self-care/ADL training;Therapeutic activities    OT Goals(Current goals can be found in the care plan section) Acute Rehab OT Goals Patient Stated Goal: to go home OT Goal Formulation: All assessment and education complete, DC therapy  OT Frequency:     Barriers to D/C:            Co-evaluation              AM-PAC OT "6 Clicks" Daily Activity     Outcome Measure Help from another person eating meals?: None Help from another person taking care of personal grooming?: None Help from another person toileting, which includes using toliet, bedpan, or urinal?: None Help from another person bathing (including washing, rinsing,  drying)?: A Little Help from another person to put on and taking off regular upper body clothing?: None Help from another person to put on and taking off regular lower body clothing?: A Little 6 Click Score: 22   End of Session Equipment Utilized During Treatment: Gait belt Nurse Communication: Mobility status  Activity Tolerance: Patient tolerated treatment well Patient left: in chair;with call bell/phone within reach  OT Visit Diagnosis: Unsteadiness on feet (R26.81)                Time: 9381-0175 OT Time Calculation (min): 32 min Charges:  OT General Charges $OT Visit: 1 Visit OT Evaluation $OT Eval Low Complexity:  1 Low OT Treatments $Self Care/Home Management : 8-22 mins  Gerrianne Scale, MS, OTR/L ascom [phone] 03/15/20, 1:54 PM

## 2020-03-15 NOTE — TOC Progression Note (Signed)
Transition of Care [facility]) - Progression Note    Patient Details  Name: Joshua [name] MRN: [id number] Date of Birth: 07-10-1956  Transition of Care [facility]) CM/SW [place], RN Phone Number: 03/15/2020, 1:20 PM  Clinical Narrative:   Met with the patient in the room to discuss DC plan and needs He has DME at home and does not need more, he said his family will be helping him, He wants to use [facility] and no one else, he wants to use [facility] the PT, I called Corene Cornea with [facility] and requested them to accept the patient Awaiting a responce    Expected Discharge Plan: [place] Barriers to Discharge: Barriers Resolved  Expected Discharge Plan and Services Expected Discharge Plan: [name]   Discharge Planning Services: CM Consult   Living arrangements for the past 2 months: Single Family Home Expected Discharge Date: 03/15/20               DME Arranged: N/A         HH Arranged: Patient Refused [place] Agency: [place] ([place] Date HH Agency Contacted: 03/15/20 Time [place]: 1320 Representative spoke with at [place]: [place] ([place]) Interventions    Readmission Risk Interventions No flowsheet data found.

## 2020-03-15 NOTE — Evaluation (Signed)
Physical Therapy Evaluation Patient Details Name: Joshua [name] MRN: [id number] DOB: 1955/11/05 Today's Date: 03/15/2020   History of Present Illness  Pt is 64 y/o M with h/o cervical myelopathy now POD1 from ACDF C4-7  Clinical Impression  Pt received in bed and agreeable to PT evaluation. Pt donned aspen collar throughout session. Pt with no reports of sensational changes from L to R and coordination appears equal from L to R (alternating supination/pronation, heel to shin, finger to thumb). Pt performed sit <> stand with mod I with use of BUE on arms of chair but required no external physical assistance. Pt ambulated in hallway with CGA performing speed changes and quick stops with no noted LOB. Pt with gait deficits noted however no overt LOB noted. Pt presents today with decreased strength, balance, ROM, and overall mobility. Recommend skilled PT to address aforementioned deficits during acute stay and follow surgeon's recommendations for follow up PT to optimize return to PLOF and maximize functional mobility.   This entire session was guided, instructed, and directly supervised by Greggory Stallion, DPT.     Follow Up Recommendations Follow surgeon's recommendation for DC plan and follow-up therapies    Equipment Recommendations  None recommended by PT    Recommendations for Other Services       Precautions / Restrictions Precautions Precautions: Cervical;Fall Restrictions Weight Bearing Restrictions: No Other Position/Activity Restrictions: No bending, lifting >10#, twisting, arching cervical region      Mobility  Bed Mobility Overal bed mobility: Modified Independent             General bed mobility comments: not performed as pt seated in chair upon arrival  Transfers Overall transfer level: Modified independent Equipment used: None             General transfer comment: required no physical assistance and noted to use BUE on armrests to  perform  Ambulation/Gait Ambulation/Gait assistance: Min guard Gait Distance (Feet): 400 Feet Assistive device: None Gait Pattern/deviations: Trunk flexed Gait velocity: average   General Gait Details: Pt noted to ambulate with increased sway from side to side, varies from "dragging" R foot to decreased clearance of R foot during swing phase of gait, remains slightly flexed at hips and appears to ambulate with anterior weight shift to BOS. CGA provided due to task requests of changing gait speeds and stopping on commands.  Stairs            Wheelchair Mobility    Modified Rankin (Stroke Patients Only)       Balance Overall balance assessment: Mild deficits observed, not formally tested                                           Pertinent Vitals/Pain Pain Assessment: No/denies pain    Home Living Family/patient expects to be discharged to:: Private residence Living Arrangements: Alone Available Help at Discharge: Family;Friend(s);Available PRN/intermittently Type of Home: House Home Access: Ramped entrance     Home Layout: One level Home Equipment: Walker - 2 wheels;Cane - single point;Wheelchair - [employer];Shower seat;Walker - 4 wheels Additional Comments: reports much of the equipment was his mom's as he was caring for her and she passed in Feb of this year (2021)    Prior Function Level of Independence: Independent with assistive device(s)         Comments: Reports occasional/intermittent use of SPC for mobility. Endorses  1 fall in 2021 resulting in black eye and "road rash" on the R side of his face.     Hand Dominance   Dominant Hand: Right    Extremity/Trunk Assessment   Upper Extremity Assessment Upper Extremity Assessment: Overall WFL for tasks assessed (grossly 4- to 4/5 bilaterally)    Lower Extremity Assessment Lower Extremity Assessment: Overall WFL for tasks assessed (grossly 4/5 bilaterally) RLE Deficits /  Details: slightly decreased ROM and response time in R versus L, pt reports h/o dragging R LE which he indicates appears to actually be improved this date post op'ly    Cervical / Trunk Assessment Cervical / Trunk Assessment: Normal (aspen collar placed for cervical stability, alignment appears to be [facility])  Communication   Communication: No difficulties  Cognition Arousal/Alertness: Awake/alert Behavior During Therapy: WFL for tasks assessed/performed Overall Cognitive Status: Within Functional Limits for tasks assessed                                        General Comments General comments (skin integrity, edema, etc.): noted equal coordination of UE and LE; pt reports slightly better sensation on L side    Exercises Other Exercises Other Exercises: OT facilitates ed re: donning Aspen collar, when to wear and precautions post op'ly. Pt with moderate understanding, could benefit from f/u in the natural environment from [facility] therapy. Other Exercises: pt educated on usage of aspen collar and pt able to don/doff independently   Assessment/Plan    PT Assessment Patient needs continued PT services  PT Problem List Decreased strength;Decreased range of motion;Decreased activity tolerance;Decreased balance;Decreased mobility       PT Treatment Interventions Gait training;Stair training;Functional mobility training;Therapeutic activities;Therapeutic exercise;Balance training;Patient/family education    PT Goals (Current goals can be found in the Care Plan section)  Acute Rehab PT Goals Patient Stated Goal: to go home PT Goal Formulation: With patient Time For Goal Achievement: 03/29/20 Potential to Achieve Goals: Good    Frequency Min 2X/week   Barriers to discharge        Co-evaluation               AM-PAC PT "6 Clicks" Mobility  Outcome Measure Help needed turning from your back to your side while in a flat bed without using bedrails?: A Little Help  needed moving from lying on your back to sitting on the side of a flat bed without using bedrails?: A Little Help needed moving to and from a bed to a chair (including a wheelchair)?: A Little Help needed standing up from a chair using your arms (e.g., wheelchair or bedside chair)?: A Little Help needed to walk in hospital room?: A Little Help needed climbing 3-5 steps with a railing? : A Little 6 Click Score: 18    End of Session Equipment Utilized During Treatment: Gait belt Activity Tolerance: Patient tolerated treatment well Patient left: in chair;with call bell/phone within reach Nurse Communication: Mobility status PT Visit Diagnosis: Other abnormalities of gait and mobility (R26.89);Muscle weakness (generalized) (M62.81);History of falling (Z91.81)    Time: 9629-5284 PT Time Calculation (min) (ACUTE ONLY): 20 min   Charges:              [place], SPT  Darryl Blumenstein 03/15/2020, 1:21 PM

## 2020-03-17 DIAGNOSIS — Z7902 Long term (current) use of antithrombotics/antiplatelets: Secondary | ICD-10-CM | POA: Diagnosis not present

## 2020-03-17 DIAGNOSIS — I10 Essential (primary) hypertension: Secondary | ICD-10-CM | POA: Diagnosis not present

## 2020-03-17 DIAGNOSIS — M4802 Spinal stenosis, cervical region: Secondary | ICD-10-CM | POA: Diagnosis not present

## 2020-03-17 DIAGNOSIS — I739 Peripheral vascular disease, unspecified: Secondary | ICD-10-CM | POA: Diagnosis not present

## 2020-03-17 DIAGNOSIS — Z4789 Encounter for other orthopedic aftercare: Secondary | ICD-10-CM | POA: Diagnosis not present

## 2020-03-17 DIAGNOSIS — Z95820 Peripheral vascular angioplasty status with implants and grafts: Secondary | ICD-10-CM | POA: Diagnosis not present

## 2020-03-17 DIAGNOSIS — Z9181 History of falling: Secondary | ICD-10-CM | POA: Diagnosis not present

## 2020-03-17 DIAGNOSIS — Z981 Arthrodesis status: Secondary | ICD-10-CM | POA: Diagnosis not present

## 2020-03-17 DIAGNOSIS — G959 Disease of spinal cord, unspecified: Secondary | ICD-10-CM | POA: Diagnosis not present

## 2020-03-17 DIAGNOSIS — Z79891 Long term (current) use of opiate analgesic: Secondary | ICD-10-CM | POA: Diagnosis not present

## 2020-03-21 ENCOUNTER — Telehealth: Payer: Self-pay | Admitting: Nurse Practitioner

## 2020-03-21 DIAGNOSIS — Z981 Arthrodesis status: Secondary | ICD-10-CM | POA: Diagnosis not present

## 2020-03-21 DIAGNOSIS — Z4789 Encounter for other orthopedic aftercare: Secondary | ICD-10-CM | POA: Diagnosis not present

## 2020-03-21 DIAGNOSIS — Z9181 History of falling: Secondary | ICD-10-CM | POA: Diagnosis not present

## 2020-03-21 DIAGNOSIS — Z95820 Peripheral vascular angioplasty status with implants and grafts: Secondary | ICD-10-CM | POA: Diagnosis not present

## 2020-03-21 DIAGNOSIS — I10 Essential (primary) hypertension: Secondary | ICD-10-CM | POA: Diagnosis not present

## 2020-03-21 DIAGNOSIS — M4802 Spinal stenosis, cervical region: Secondary | ICD-10-CM | POA: Diagnosis not present

## 2020-03-21 DIAGNOSIS — Z79891 Long term (current) use of opiate analgesic: Secondary | ICD-10-CM | POA: Diagnosis not present

## 2020-03-21 DIAGNOSIS — Z7902 Long term (current) use of antithrombotics/antiplatelets: Secondary | ICD-10-CM | POA: Diagnosis not present

## 2020-03-21 DIAGNOSIS — I739 Peripheral vascular disease, unspecified: Secondary | ICD-10-CM | POA: Diagnosis not present

## 2020-03-21 DIAGNOSIS — G959 Disease of spinal cord, unspecified: Secondary | ICD-10-CM | POA: Diagnosis not present

## 2020-03-21 NOTE — Telephone Encounter (Signed)
Received call from Joshua Haggart. He reports that he thinks he has "a bloodclot".  He reports that he noted a sharp pain behind his R knee earlier today that is reminiscent of his prior occurrences for which he has seen [facility] and Dr. Ronalee Belts. Per chart review, he was noted to have limb threatening ischemia and underwent a RT Leg Angiography for Ischemic RT leg on 12/02/2019.   He is s/p ACDF on 03/14/2020, and has been off his Eliquis and Plavix for one week prior to surgery and was instructed to hold these for a total of two weeks post operatively. He was instructed to hold his ASA for 5 days postoperatively.   He denies any RLE swelling, or redness. Denies any calf pain or SOB. He does say that he is unable to feel a pulse to his lower R leg but is unsure that he had ever been able to.   I attempted to call [facility]'s office, left a message. I then called [facility] and asked for the on call [facility] and [facility] MD to contact Joshua [name].  Given his symptoms, and that he has been off of his [facility] and is s/p spinal surgery, I have advised him to seek evaluation at the ED for his concerns.   He verbalized understanding.

## 2020-03-22 ENCOUNTER — Other Ambulatory Visit: Payer: Self-pay

## 2020-03-22 ENCOUNTER — Other Ambulatory Visit (INDEPENDENT_AMBULATORY_CARE_PROVIDER_SITE_OTHER): Payer: Self-pay | Admitting: Vascular Surgery

## 2020-03-22 ENCOUNTER — Ambulatory Visit (INDEPENDENT_AMBULATORY_CARE_PROVIDER_SITE_OTHER): Payer: Medicare Other | Admitting: Vascular Surgery

## 2020-03-22 ENCOUNTER — Telehealth (INDEPENDENT_AMBULATORY_CARE_PROVIDER_SITE_OTHER): Payer: Self-pay | Admitting: Vascular Surgery

## 2020-03-22 ENCOUNTER — Ambulatory Visit (INDEPENDENT_AMBULATORY_CARE_PROVIDER_SITE_OTHER): Payer: Medicare Other

## 2020-03-22 ENCOUNTER — Other Ambulatory Visit (INDEPENDENT_AMBULATORY_CARE_PROVIDER_SITE_OTHER): Payer: Self-pay | Admitting: Nurse Practitioner

## 2020-03-22 ENCOUNTER — Encounter (INDEPENDENT_AMBULATORY_CARE_PROVIDER_SITE_OTHER): Payer: Self-pay | Admitting: Vascular Surgery

## 2020-03-22 VITALS — BP 131/75 | HR 86 | Resp 16 | Wt 207.0 lb

## 2020-03-22 DIAGNOSIS — E782 Mixed hyperlipidemia: Secondary | ICD-10-CM

## 2020-03-22 DIAGNOSIS — I70219 Atherosclerosis of native arteries of extremities with intermittent claudication, unspecified extremity: Secondary | ICD-10-CM

## 2020-03-22 DIAGNOSIS — I10 Essential (primary) hypertension: Secondary | ICD-10-CM | POA: Diagnosis not present

## 2020-03-22 DIAGNOSIS — I998 Other disorder of circulatory system: Secondary | ICD-10-CM | POA: Diagnosis not present

## 2020-03-22 DIAGNOSIS — Z9889 Other specified postprocedural states: Secondary | ICD-10-CM

## 2020-03-22 NOTE — H&P (View-Only) (Signed)
MRN : [id number]  Joshua GLANDER is a 64 y.o. (1955-12-02) male who presents with chief complaint of  Chief Complaint  Patient presents with  . Follow-up    add on ultrasound follow up  .  History of Present Illness:   The patient returns to the office for followup and review of the noninvasive studies. There has been a significant deterioration in the lower extremity symptoms.  The patient notes interval shortening of their claudication distance and development of mild rest pain symptoms that occurred 36 hours ago. No new ulcers or wounds have occurred since the last visit.  He has been off his anticoagulation since his neck surgery.  There have been no significant changes to the patient's overall health care.  The patient denies amaurosis fugax or recent TIA symptoms. There are no recent neurological changes noted. The patient denies history of DVT, PE or superficial thrombophlebitis. The patient denies recent episodes of angina or shortness of breath.   Duplex US of the lower extremity arterial system shows occlusion of the right fem distal bypass reconstruction  Current Meds  Medication Sig  . acetaminophen (TYLENOL) 650 MG CR tablet Take 1,300 mg by mouth every 8 (eight) hours as needed for pain.   Marland Kitchen aspirin EC 81 MG tablet Take 1 tablet (81 mg total) by mouth daily. Restart 5 days after surgery  . gabapentin (NEURONTIN) 300 MG capsule Take 1 capsule (300 mg total) by mouth at bedtime.  Marland Kitchen lisinopril (ZESTRIL) 10 MG tablet Take 1 tablet (10 mg total) by mouth daily.  . methocarbamol (ROBAXIN) 500 MG tablet Take 1 tablet (500 mg total) by mouth every 6 (six) hours as needed for muscle spasms.  Marland Kitchen oxyCODONE (OXY IR/ROXICODONE) 5 MG immediate release tablet Take 1 tablet (5 mg total) by mouth every 3 (three) hours as needed for up to 7 days for moderate pain ((score 4 to 6)).  Marland Kitchen senna-docusate (SENOKOT-S) 8.6-50 MG tablet Take 1 tablet by mouth at bedtime as needed for mild  constipation.  . simvastatin (ZOCOR) 20 MG tablet Take 1 tablet (20 mg total) by mouth daily at 6 PM.    Past Medical History:  Diagnosis Date  . Allergy   . Dyspnea    former smoker. only doe  . History of kidney stones   . Peripheral vascular disease [facility])     Past Surgical History:  Procedure Laterality Date  . ANTERIOR CERVICAL DECOMP/DISCECTOMY FUSION N/A 03/14/2020   Procedure: ANTERIOR CERVICAL DECOMPRESSION/DISCECTOMY FUSION 3 LEVELS C4-7;  Surgeon: Meade Maw, MD;  Location: [facility] ORS;  Service: Neurosurgery;  Laterality: N/A;  . BACK SURGERY  2011  . CENTRAL LINE INSERTION Right 09/10/2016   Procedure: CENTRAL LINE INSERTION;  Surgeon: Katha Cabal, MD;  Location: [facility] ORS;  Service: Vascular;  Laterality: Right;  . COLONOSCOPY  2013 ?  . FEMORAL-POPLITEAL BYPASS GRAFT Right 09/10/2016   Procedure: BYPASS GRAFT FEMORAL-POPLITEAL ARTERY ( BELOW KNEE );  Surgeon: Katha Cabal, MD;  Location: [facility] ORS;  Service: Vascular;  Laterality: Right;  . LOWER EXTREMITY ANGIOGRAPHY Right 11/18/2016   Procedure: Lower Extremity Angiography;  Surgeon: Katha Cabal, MD;  Location: [place] CV LAB;  Service: Cardiovascular;  Laterality: Right;  . LOWER EXTREMITY ANGIOGRAPHY Right 12/09/2016   Procedure: Lower Extremity Angiography;  Surgeon: Katha Cabal, MD;  Location: [place] [facility];  Service: Cardiovascular;  Laterality: Right;  . LOWER EXTREMITY ANGIOGRAPHY Right 11/15/2019   Procedure: LOWER EXTREMITY ANGIOGRAPHY;  Surgeon:  Shameer Molstad, Dolores Lory, MD;  Location: [place] CV [facility];  Service: Cardiovascular;  Laterality: Right;  . LOWER EXTREMITY ANGIOGRAPHY Right 12/01/2019   Procedure: Lower Extremity Angiography;  Surgeon: Algernon Huxley, MD;  Location: [place] CV LAB;  Service: Cardiovascular;  Laterality: Right;  . LOWER EXTREMITY ANGIOGRAPHY Right 12/02/2019   Procedure: LOWER EXTREMITY ANGIOGRAPHY;  Surgeon: Katha Cabal, MD;  Location: [place] CV LAB;  Service: Cardiovascular;  Laterality: Right;  . LOWER EXTREMITY INTERVENTION  11/18/2016   Procedure: Lower Extremity Intervention;  Surgeon: Katha Cabal, MD;  Location: [place] CV LAB;  Service: Cardiovascular;;  . PERIPHERAL VASCULAR CATHETERIZATION Right 01/02/2015   Procedure: Lower Extremity Angiography;  Surgeon: Katha Cabal, MD;  Location: [place] CV LAB;  Service: Cardiovascular;  Laterality: Right;  . PERIPHERAL VASCULAR CATHETERIZATION Right 06/18/2015   Procedure: Lower Extremity Angiography;  Surgeon: Algernon Huxley, MD;  Location: [place] CV LAB;  Service: Cardiovascular;  Laterality: Right;  . PERIPHERAL VASCULAR CATHETERIZATION  06/18/2015   Procedure: Lower Extremity Intervention;  Surgeon: Algernon Huxley, MD;  Location: [place] CV LAB;  Service: Cardiovascular;;  . PERIPHERAL VASCULAR CATHETERIZATION N/A 10/09/2015   Procedure: Abdominal Aortogram w/Lower Extremity;  Surgeon: Katha Cabal, MD;  Location: [place] CV LAB;  Service: Cardiovascular;  Laterality: N/A;  . PERIPHERAL VASCULAR CATHETERIZATION  10/09/2015   Procedure: Lower Extremity Intervention;  Surgeon: Katha Cabal, MD;  Location: [place] CV LAB;  Service: Cardiovascular;;  . PERIPHERAL VASCULAR CATHETERIZATION Right 10/10/2015   Procedure: Lower Extremity Angiography;  Surgeon: Algernon Huxley, MD;  Location: [place] CV LAB;  Service: Cardiovascular;  Laterality: Right;  . PERIPHERAL VASCULAR CATHETERIZATION Right 10/10/2015   Procedure: Lower Extremity Intervention;  Surgeon: Algernon Huxley, MD;  Location: [place] CV LAB;  Service: Cardiovascular;  Laterality: Right;  . PERIPHERAL VASCULAR CATHETERIZATION Right 12/03/2015   Procedure: Lower Extremity Angiography;  Surgeon: Algernon Huxley, MD;  Location: [place] CV LAB;  Service: Cardiovascular;  Laterality: Right;  . PERIPHERAL VASCULAR CATHETERIZATION Right 12/04/2015   Procedure: Lower Extremity  Angiography;  Surgeon: Algernon Huxley, MD;  Location: [place] CV LAB;  Service: Cardiovascular;  Laterality: Right;  . PERIPHERAL VASCULAR CATHETERIZATION  12/04/2015   Procedure: Lower Extremity Intervention;  Surgeon: Algernon Huxley, MD;  Location: [place] CV LAB;  Service: Cardiovascular;;  . PERIPHERAL VASCULAR CATHETERIZATION Right 06/30/2016   Procedure: Lower Extremity Angiography;  Surgeon: Algernon Huxley, MD;  Location: [place] CV LAB;  Service: Cardiovascular;  Laterality: Right;  . PERIPHERAL VASCULAR CATHETERIZATION Right 06/25/2016   Procedure: Lower Extremity Angiography;  Surgeon: Katha Cabal, MD;  Location: [place] [facility];  Service: Cardiovascular;  Laterality: Right;  . PERIPHERAL VASCULAR CATHETERIZATION Right 07/01/2016   Procedure: Lower Extremity Angiography;  Surgeon: Katha Cabal, MD;  Location: [place] CV LAB;  Service: Cardiovascular;  Laterality: Right;  . PERIPHERAL VASCULAR CATHETERIZATION  07/01/2016   Procedure: Lower Extremity Intervention;  Surgeon: Katha Cabal, MD;  Location: [place] CV LAB;  Service: Cardiovascular;;  . PERIPHERAL VASCULAR CATHETERIZATION Right 08/01/2016   Procedure: Lower Extremity Angiography;  Surgeon: Katha Cabal, MD;  Location: [place] [facility];  Service: Cardiovascular;  Laterality: Right;  . stent placement in right leg Right     Social History Social History   Tobacco Use  . Smoking status: Former Smoker    Packs/day: 1.50    Years: 38.00  Pack years: 57.00    Types: Cigarettes    Quit date: 12/20/2014    Years since quitting: 5.2  . Smokeless tobacco: Former Network engineer  . Vaping Use: Never used  Substance Use Topics  . Alcohol use: Not Currently  . Drug use: No    Family History Family History  Problem Relation Age of Onset  . Diabetes Mother   . Hypertension Mother   . Heart murmur Mother   . Colon cancer Neg Hx   . Breast cancer Neg Hx     No Known  Allergies   REVIEW OF SYSTEMS (Negative unless checked)  Constitutional: [] Weight loss  [] Fever  [] Chills Cardiac: [] Chest pain   [] Chest pressure   [] Palpitations   [] Shortness of breath when laying flat   [] Shortness of breath with exertion. Vascular:  [x] Pain in legs with walking   [x] Pain in legs at rest  [] History of DVT   [] Phlebitis   [] Swelling in legs   [] Varicose veins   [] Non-healing ulcers Pulmonary:   [] Uses home oxygen   [] Productive cough   [] Hemoptysis   [] Wheeze  [] COPD   [] Asthma Neurologic:  [] Dizziness   [] Seizures   [] History of stroke   [] History of TIA  [] Aphasia   [] Vissual changes   [] Weakness or numbness in arm   [] Weakness or numbness in leg Musculoskeletal:   [] Joint swelling   [x] Joint pain   [] Low back pain Hematologic:  [] Easy bruising  [] Easy bleeding   [] Hypercoagulable state   [] Anemic Gastrointestinal:  [] Diarrhea   [] Vomiting  [] Gastroesophageal reflux/heartburn   [] Difficulty swallowing. Genitourinary:  [] Chronic kidney disease   [] Difficult urination  [] Frequent urination   [] Blood in urine Skin:  [] Rashes   [] Ulcers  Psychological:  [] History of anxiety   []  History of major depression.  Physical Examination  Vitals:   03/22/20 1619  BP: 131/75  Pulse: 86  Resp: 16  Weight: 207 lb (93.9 kg)   Body mass index is 28.07 kg/m. Gen: WD/WN, NAD Head: [place]/AT, No temporalis wasting.  Ear/Nose/Throat: Hearing grossly intact, nares w/o erythema or drainage Eyes: PER, EOMI, sclera nonicteric.  Neck: Supple, no large masses.   Pulmonary:  Good air movement, no audible wheezing bilaterally, no use of accessory muscles.  Cardiac: RRR, no JVD Vascular:  Right foot is cool to touch and has sluggish cappillary refill Vessel Right Left  Radial Palpable Palpable  PT Not Palpable Palpable  DP Not Palpable Palpable  Gastrointestinal: Non-distended. No guarding/no peritoneal signs.  Musculoskeletal: M/S 5/5 throughout.  No deformity or atrophy.  Neurologic:  CN 2-12 intact. Symmetrical.  Speech is fluent. Motor exam as listed above. Psychiatric: Judgment intact, Mood & affect appropriate for pt's clinical situation. Dermatologic: No rashes or ulcers noted.  No changes consistent with cellulitis.   CBC Lab Results  Component Value Date   WBC 5.9 03/05/2020   HGB 12.4 (L) 03/05/2020   HCT 39.7 03/05/2020   MCV 82.4 03/05/2020   PLT 250 03/05/2020    BMET    Component Value Date/Time   NA 140 03/05/2020 1044   NA 141 10/14/2019 1101   K 4.3 03/05/2020 1044   CL 104 03/05/2020 1044   CO2 28 03/05/2020 1044   GLUCOSE 93 03/05/2020 1044   BUN 19 03/05/2020 1044   BUN 15 10/14/2019 1101   BUN 16 07/03/2014 1205   CREATININE 1.16 03/05/2020 1044   CREATININE 1.34 (H) 07/03/2014 1205   CALCIUM 9.2 03/05/2020 1044   GFRNONAA >60  03/05/2020 1044   GFRNONAA 59 (L) 07/03/2014 1205   GFRAA >60 03/05/2020 1044   GFRAA >60 07/03/2014 1205   Estimated Creatinine Clearance: 79.5 mL/min (by C-G formula based on SCr of 1.16 mg/dL).  COAG Lab Results  Component Value Date   INR 1.0 03/05/2020   INR 1.11 06/25/2016   INR 1.90 05/12/2016    Radiology DG Cervical Spine 2 or 3 views  Result Date: 03/15/2020 CLINICAL DATA:  Status post fusion EXAM: CERVICAL SPINE - 2-3 VIEW COMPARISON:  Intraoperative images March 14, 2020; cervical MRI February 06, 2020 FINDINGS: Frontal and lateral views were obtained. There is anterior screw and plate fixation from Z6-X0 with disc spacers at C4-5, C5-6, and C6-7. Support hardware is intact. No fracture or spondylolisthesis. There is moderate disc space narrowing at C3-4. Other disc spaces appear unremarkable. Mild prevertebral soft tissue swelling is likely due to recent surgery. Lung apices are clear. IMPRESSION: Anterior fusion from C4-C7 with disc spacers at C4-5, C5-6, and C6-7. Support hardware intact. Moderate disc space narrowing noted at C3-4. Other discs appear unremarkable. No fracture or  spondylolisthesis. Electronically Signed   By: Lowella Grip III M.D.   On: 03/15/2020 08:21   DG Cervical Spine 2 or 3 views  Result Date: 03/14/2020 CLINICAL DATA:  Cervical fusion EXAM: CERVICAL SPINE - 2-3 VIEW; DG C-ARM 1-60 MIN-NO REPORT; DG C-ARM 1-60 MIN COMPARISON:  MRI cervical spine 02/06/2020 FINDINGS: AP and lateral images of the cervical spine were obtained with C-arm. The patient is intubated. ACDF C4 through C7. Anterior plate and screws in good position. Interbody spacers in good position. Normal alignment. IMPRESSION: Satisfactory ACDF C4 through C7. Electronically Signed   By: Franchot Gallo M.D.   On: 03/14/2020 12:49   DG C-Arm 1-60 Min  Result Date: 03/14/2020 CLINICAL DATA:  Cervical fusion EXAM: CERVICAL SPINE - 2-3 VIEW; DG C-ARM 1-60 MIN-NO REPORT; DG C-ARM 1-60 MIN COMPARISON:  MRI cervical spine 02/06/2020 FINDINGS: AP and lateral images of the cervical spine were obtained with C-arm. The patient is intubated. ACDF C4 through C7. Anterior plate and screws in good position. Interbody spacers in good position. Normal alignment. IMPRESSION: Satisfactory ACDF C4 through C7. Electronically Signed   By: Franchot Gallo M.D.   On: 03/14/2020 12:49   DG C-Arm 1-60 Min-No Report  Result Date: 03/14/2020 CLINICAL DATA:  Cervical fusion EXAM: CERVICAL SPINE - 2-3 VIEW; DG C-ARM 1-60 MIN-NO REPORT; DG C-ARM 1-60 MIN COMPARISON:  MRI cervical spine 02/06/2020 FINDINGS: AP and lateral images of the cervical spine were obtained with C-arm. The patient is intubated. ACDF C4 through C7. Anterior plate and screws in good position. Interbody spacers in good position. Normal alignment. IMPRESSION: Satisfactory ACDF C4 through C7. Electronically Signed   By: Franchot Gallo M.D.   On: 03/14/2020 12:49      Assessment/Plan 1. Ischemia of right lower extremity Recommend:  The patient has evidence of severe atherosclerotic changes of both lower extremities with rest pain that is associated  with preulcerative changes and impending tissue loss of the right foot.  This represents a limb threatening ischemia and places the patient at the risk for right limb loss.  Patient should undergo angiography of the right lower extremities with the hope for intervention for limb salvage.  The risks and benefits as well as the alternative therapies was discussed in detail with the patient.  All questions were answered.  Patient agrees to proceed with right leg angiography.  The patient will follow  up with me in the office after the procedure.   2. Essential hypertension Continue antihypertensive medications as already ordered, these medications have been reviewed and there are no changes at this time.   3. Mixed hyperlipidemia Continue statin as ordered and reviewed, no changes at this time   4. History of back surgery I will not be able to use Tpa because of his recent surgery   [name], MD  03/22/2020 5:07 PM

## 2020-03-22 NOTE — Progress Notes (Signed)
MRN : [id number]  Joshua [name] is a 64 y.o. (Jan 08, 1956) male who presents with chief complaint of  Chief Complaint  Patient presents with  . Follow-up    add on ultrasound follow up  .  History of Present Illness:   The patient returns to the office for followup and review of the noninvasive studies. There has been a significant deterioration in the lower extremity symptoms.  The patient notes interval shortening of their claudication distance and development of mild rest pain symptoms that occurred 36 hours ago. No new ulcers or wounds have occurred since the last visit.  He has been off his anticoagulation since his neck surgery.  There have been no significant changes to the patient's overall health care.  The patient denies amaurosis fugax or recent TIA symptoms. There are no recent neurological changes noted. The patient denies history of DVT, PE or superficial thrombophlebitis. The patient denies recent episodes of angina or shortness of breath.   Duplex US of the lower extremity arterial system shows occlusion of the right fem distal bypass reconstruction  Current Meds  Medication Sig  . acetaminophen (TYLENOL) 650 MG CR tablet Take 1,300 mg by mouth every 8 (eight) hours as needed for pain.   Marland Kitchen aspirin EC 81 MG tablet Take 1 tablet (81 mg total) by mouth daily. Restart 5 days after surgery  . gabapentin (NEURONTIN) 300 MG capsule Take 1 capsule (300 mg total) by mouth at bedtime.  Marland Kitchen lisinopril (ZESTRIL) 10 MG tablet Take 1 tablet (10 mg total) by mouth daily.  . methocarbamol (ROBAXIN) 500 MG tablet Take 1 tablet (500 mg total) by mouth every 6 (six) hours as needed for muscle spasms.  Marland Kitchen oxyCODONE (OXY IR/ROXICODONE) 5 MG immediate release tablet Take 1 tablet (5 mg total) by mouth every 3 (three) hours as needed for up to 7 days for moderate pain ((score 4 to 6)).  Marland Kitchen senna-docusate (SENOKOT-S) 8.6-50 MG tablet Take 1 tablet by mouth at bedtime as needed for mild  constipation.  . simvastatin (ZOCOR) 20 MG tablet Take 1 tablet (20 mg total) by mouth daily at 6 PM.    Past Medical History:  Diagnosis Date  . Allergy   . Dyspnea    former smoker. only doe  . History of kidney stones   . Peripheral vascular disease [facility])     Past Surgical History:  Procedure Laterality Date  . ANTERIOR CERVICAL DECOMP/DISCECTOMY FUSION N/A 03/14/2020   Procedure: ANTERIOR CERVICAL DECOMPRESSION/DISCECTOMY FUSION 3 LEVELS C4-7;  Surgeon: Meade Maw, MD;  Location: [facility] ORS;  Service: Neurosurgery;  Laterality: N/A;  . BACK SURGERY  2011  . CENTRAL LINE INSERTION Right 09/10/2016   Procedure: CENTRAL LINE INSERTION;  Surgeon: Katha Cabal, MD;  Location: [facility] ORS;  Service: Vascular;  Laterality: Right;  . COLONOSCOPY  2013 ?  . FEMORAL-POPLITEAL BYPASS GRAFT Right 09/10/2016   Procedure: BYPASS GRAFT FEMORAL-POPLITEAL ARTERY ( BELOW KNEE );  Surgeon: Katha Cabal, MD;  Location: [facility] ORS;  Service: Vascular;  Laterality: Right;  . LOWER EXTREMITY ANGIOGRAPHY Right 11/18/2016   Procedure: Lower Extremity Angiography;  Surgeon: Katha Cabal, MD;  Location: [place] [facility];  Service: Cardiovascular;  Laterality: Right;  . LOWER EXTREMITY ANGIOGRAPHY Right 12/09/2016   Procedure: Lower Extremity Angiography;  Surgeon: Katha Cabal, MD;  Location: [place] [facility];  Service: Cardiovascular;  Laterality: Right;  . LOWER EXTREMITY ANGIOGRAPHY Right 11/15/2019   Procedure: LOWER EXTREMITY ANGIOGRAPHY;  Surgeon:  Jarmon Javid, Dolores Lory, MD;  Location: [place] [facility];  Service: Cardiovascular;  Laterality: Right;  . LOWER EXTREMITY ANGIOGRAPHY Right 12/01/2019   Procedure: Lower Extremity Angiography;  Surgeon: Algernon Huxley, MD;  Location: [place] [facility];  Service: Cardiovascular;  Laterality: Right;  . LOWER EXTREMITY ANGIOGRAPHY Right 12/02/2019   Procedure: LOWER EXTREMITY ANGIOGRAPHY;  Surgeon: Katha Cabal, MD;  Location: [place] [facility];  Service: Cardiovascular;  Laterality: Right;  . LOWER EXTREMITY INTERVENTION  11/18/2016   Procedure: Lower Extremity Intervention;  Surgeon: Katha Cabal, MD;  Location: [place] [facility];  Service: Cardiovascular;;  . PERIPHERAL VASCULAR CATHETERIZATION Right 01/02/2015   Procedure: Lower Extremity Angiography;  Surgeon: Katha Cabal, MD;  Location: [place] [facility];  Service: Cardiovascular;  Laterality: Right;  . PERIPHERAL VASCULAR CATHETERIZATION Right 06/18/2015   Procedure: Lower Extremity Angiography;  Surgeon: Algernon Huxley, MD;  Location: [place] [facility];  Service: Cardiovascular;  Laterality: Right;  . PERIPHERAL VASCULAR CATHETERIZATION  06/18/2015   Procedure: Lower Extremity Intervention;  Surgeon: Algernon Huxley, MD;  Location: [place] [facility];  Service: Cardiovascular;;  . PERIPHERAL VASCULAR CATHETERIZATION N/A 10/09/2015   Procedure: Abdominal Aortogram w/Lower Extremity;  Surgeon: Katha Cabal, MD;  Location: [place] [facility];  Service: Cardiovascular;  Laterality: N/A;  . PERIPHERAL VASCULAR CATHETERIZATION  10/09/2015   Procedure: Lower Extremity Intervention;  Surgeon: Katha Cabal, MD;  Location: [place] [facility];  Service: Cardiovascular;;  . PERIPHERAL VASCULAR CATHETERIZATION Right 10/10/2015   Procedure: Lower Extremity Angiography;  Surgeon: Algernon Huxley, MD;  Location: [place] [facility];  Service: Cardiovascular;  Laterality: Right;  . PERIPHERAL VASCULAR CATHETERIZATION Right 10/10/2015   Procedure: Lower Extremity Intervention;  Surgeon: Algernon Huxley, MD;  Location: [place] [facility];  Service: Cardiovascular;  Laterality: Right;  . PERIPHERAL VASCULAR CATHETERIZATION Right 12/03/2015   Procedure: Lower Extremity Angiography;  Surgeon: Algernon Huxley, MD;  Location: [place] [facility];  Service: Cardiovascular;  Laterality: Right;  . PERIPHERAL VASCULAR CATHETERIZATION Right 12/04/2015   Procedure: Lower Extremity  Angiography;  Surgeon: Algernon Huxley, MD;  Location: [place] [facility];  Service: Cardiovascular;  Laterality: Right;  . PERIPHERAL VASCULAR CATHETERIZATION  12/04/2015   Procedure: Lower Extremity Intervention;  Surgeon: Algernon Huxley, MD;  Location: [place] [facility];  Service: Cardiovascular;;  . PERIPHERAL VASCULAR CATHETERIZATION Right 06/30/2016   Procedure: Lower Extremity Angiography;  Surgeon: Algernon Huxley, MD;  Location: [place] [facility];  Service: Cardiovascular;  Laterality: Right;  . PERIPHERAL VASCULAR CATHETERIZATION Right 06/25/2016   Procedure: Lower Extremity Angiography;  Surgeon: Katha Cabal, MD;  Location: [place] [facility];  Service: Cardiovascular;  Laterality: Right;  . PERIPHERAL VASCULAR CATHETERIZATION Right 07/01/2016   Procedure: Lower Extremity Angiography;  Surgeon: Katha Cabal, MD;  Location: [place] [facility];  Service: Cardiovascular;  Laterality: Right;  . PERIPHERAL VASCULAR CATHETERIZATION  07/01/2016   Procedure: Lower Extremity Intervention;  Surgeon: Katha Cabal, MD;  Location: [place] [facility];  Service: Cardiovascular;;  . PERIPHERAL VASCULAR CATHETERIZATION Right 08/01/2016   Procedure: Lower Extremity Angiography;  Surgeon: Katha Cabal, MD;  Location: [place] [facility];  Service: Cardiovascular;  Laterality: Right;  . stent placement in right leg Right     Social History Social History   Tobacco Use  . Smoking status: Former Smoker    Packs/day: 1.50    Years: 38.00  Pack years: 57.00    Types: Cigarettes    Quit date: 12/20/2014    Years since quitting: 5.2  . Smokeless tobacco: Former Network engineer  . Vaping Use: Never used  Substance Use Topics  . Alcohol use: Not Currently  . Drug use: No    Family History Family History  Problem Relation Age of Onset  . Diabetes Mother   . Hypertension Mother   . Heart murmur Mother   . Colon cancer Neg Hx   . Breast cancer Neg Hx     No Known  Allergies   REVIEW OF SYSTEMS (Negative unless checked)  Constitutional: [] Weight loss  [] Fever  [] Chills Cardiac: [] Chest pain   [] Chest pressure   [] Palpitations   [] Shortness of breath when laying flat   [] Shortness of breath with exertion. Vascular:  [x] Pain in legs with walking   [x] Pain in legs at rest  [] History of DVT   [] Phlebitis   [] Swelling in legs   [] Varicose veins   [] Non-healing ulcers Pulmonary:   [] Uses home oxygen   [] Productive cough   [] Hemoptysis   [] Wheeze  [] COPD   [] Asthma Neurologic:  [] Dizziness   [] Seizures   [] History of stroke   [] History of TIA  [] Aphasia   [] Vissual changes   [] Weakness or numbness in arm   [] Weakness or numbness in leg Musculoskeletal:   [] Joint swelling   [x] Joint pain   [] Low back pain Hematologic:  [] Easy bruising  [] Easy bleeding   [] Hypercoagulable state   [] Anemic Gastrointestinal:  [] Diarrhea   [] Vomiting  [] Gastroesophageal reflux/heartburn   [] Difficulty swallowing. Genitourinary:  [] Chronic kidney disease   [] Difficult urination  [] Frequent urination   [] Blood in urine Skin:  [] Rashes   [] Ulcers  Psychological:  [] History of anxiety   []  History of major depression.  Physical Examination  Vitals:   03/22/20 1619  BP: 131/75  Pulse: 86  Resp: 16  Weight: 207 lb (93.9 kg)   Body mass index is 28.07 kg/m. Gen: WD/WN, NAD Head: [place]/AT, No temporalis wasting.  Ear/Nose/Throat: Hearing grossly intact, nares w/o erythema or drainage Eyes: PER, EOMI, sclera nonicteric.  Neck: Supple, no large masses.   Pulmonary:  Good air movement, no audible wheezing bilaterally, no use of accessory muscles.  Cardiac: RRR, no JVD Vascular:  Right foot is cool to touch and has sluggish cappillary refill Vessel Right Left  Radial Palpable Palpable  PT Not Palpable Palpable  DP Not Palpable Palpable  Gastrointestinal: Non-distended. No guarding/no peritoneal signs.  Musculoskeletal: M/S 5/5 throughout.  No deformity or atrophy.  Neurologic:  CN 2-12 intact. Symmetrical.  Speech is fluent. Motor exam as listed above. Psychiatric: Judgment intact, Mood & affect appropriate for pt's clinical situation. Dermatologic: No rashes or ulcers noted.  No changes consistent with cellulitis.   CBC Lab Results  Component Value Date   WBC 5.9 03/05/2020   HGB 12.4 (L) 03/05/2020   HCT 39.7 03/05/2020   MCV 82.4 03/05/2020   PLT 250 03/05/2020    BMET    Component Value Date/Time   NA 140 03/05/2020 1044   NA 141 10/14/2019 1101   K 4.3 03/05/2020 1044   CL 104 03/05/2020 1044   CO2 28 03/05/2020 1044   GLUCOSE 93 03/05/2020 1044   BUN 19 03/05/2020 1044   BUN 15 10/14/2019 1101   BUN 16 07/03/2014 1205   CREATININE 1.16 03/05/2020 1044   CREATININE 1.34 (H) 07/03/2014 1205   CALCIUM 9.2 03/05/2020 1044   GFRNONAA >60  03/05/2020 1044   GFRNONAA 59 (L) 07/03/2014 1205   GFRAA >60 03/05/2020 1044   GFRAA >60 07/03/2014 1205   Estimated Creatinine Clearance: 79.5 mL/min (by C-G formula based on SCr of 1.16 mg/dL).  COAG Lab Results  Component Value Date   INR 1.0 03/05/2020   INR 1.11 06/25/2016   INR 1.90 05/12/2016    Radiology DG Cervical Spine 2 or 3 views  Result Date: 03/15/2020 CLINICAL DATA:  Status post fusion EXAM: CERVICAL SPINE - 2-3 VIEW COMPARISON:  Intraoperative images March 14, 2020; cervical MRI February 06, 2020 FINDINGS: Frontal and lateral views were obtained. There is anterior screw and plate fixation from M3-N3 with disc spacers at C4-5, C5-6, and C6-7. Support hardware is intact. No fracture or spondylolisthesis. There is moderate disc space narrowing at C3-4. Other disc spaces appear unremarkable. Mild prevertebral soft tissue swelling is likely due to recent surgery. Lung apices are clear. IMPRESSION: Anterior fusion from C4-C7 with disc spacers at C4-5, C5-6, and C6-7. Support hardware intact. Moderate disc space narrowing noted at C3-4. Other discs appear unremarkable. No fracture or  spondylolisthesis. Electronically Signed   By: Lowella Grip III M.D.   On: 03/15/2020 08:21   DG Cervical Spine 2 or 3 views  Result Date: 03/14/2020 CLINICAL DATA:  Cervical fusion EXAM: CERVICAL SPINE - 2-3 VIEW; DG C-ARM 1-60 MIN-NO REPORT; DG C-ARM 1-60 MIN COMPARISON:  MRI cervical spine 02/06/2020 FINDINGS: AP and lateral images of the cervical spine were obtained with C-arm. The patient is intubated. ACDF C4 through C7. Anterior plate and screws in good position. Interbody spacers in good position. Normal alignment. IMPRESSION: Satisfactory ACDF C4 through C7. Electronically Signed   By: Franchot Gallo M.D.   On: 03/14/2020 12:49   DG C-Arm 1-60 Min  Result Date: 03/14/2020 CLINICAL DATA:  Cervical fusion EXAM: CERVICAL SPINE - 2-3 VIEW; DG C-ARM 1-60 MIN-NO REPORT; DG C-ARM 1-60 MIN COMPARISON:  MRI cervical spine 02/06/2020 FINDINGS: AP and lateral images of the cervical spine were obtained with C-arm. The patient is intubated. ACDF C4 through C7. Anterior plate and screws in good position. Interbody spacers in good position. Normal alignment. IMPRESSION: Satisfactory ACDF C4 through C7. Electronically Signed   By: Franchot Gallo M.D.   On: 03/14/2020 12:49   DG C-Arm 1-60 Min-No Report  Result Date: 03/14/2020 CLINICAL DATA:  Cervical fusion EXAM: CERVICAL SPINE - 2-3 VIEW; DG C-ARM 1-60 MIN-NO REPORT; DG C-ARM 1-60 MIN COMPARISON:  MRI cervical spine 02/06/2020 FINDINGS: AP and lateral images of the cervical spine were obtained with C-arm. The patient is intubated. ACDF C4 through C7. Anterior plate and screws in good position. Interbody spacers in good position. Normal alignment. IMPRESSION: Satisfactory ACDF C4 through C7. Electronically Signed   By: Franchot Gallo M.D.   On: 03/14/2020 12:49      Assessment/Plan 1. Ischemia of right lower extremity Recommend:  The patient has evidence of severe atherosclerotic changes of both lower extremities with rest pain that is associated  with preulcerative changes and impending tissue loss of the right foot.  This represents a limb threatening ischemia and places the patient at the risk for right limb loss.  Patient should undergo angiography of the right lower extremities with the hope for intervention for limb salvage.  The risks and benefits as well as the alternative therapies was discussed in detail with the patient.  All questions were answered.  Patient agrees to proceed with right leg angiography.  The patient will follow  up with me in the office after the procedure.   2. Essential hypertension Continue antihypertensive medications as already ordered, these medications have been reviewed and there are no changes at this time.   3. Mixed hyperlipidemia Continue statin as ordered and reviewed, no changes at this time   4. History of back surgery I will not be able to use Tpa because of his recent surgery   [name], MD  03/22/2020 5:07 PM

## 2020-03-22 NOTE — Telephone Encounter (Signed)
Pt left a message on the nurses line  Saying he believes he has a blood clot he was also seen in neurosurgery by Dr. Cristopher Estimable who called and left a voice mail saying the patient thinks he has a blood clot as well . I returned the pts call and he made me aware that Dr. Delana Meyer has already got him scheduled to come in today at 3:00PM to be seen.

## 2020-03-22 NOTE — Telephone Encounter (Signed)
Left a voicemail stating that he believes he has another blood clot and would like to come in to be seen. Patient was last seen with ABI studies (first post op le angio) 2020/01/08 with GS. Please advise.

## 2020-03-23 ENCOUNTER — Encounter
Admission: RE | Disposition: A | Discharge status: Home / Self Care | Payer: Self-pay | Source: Home / Self Care | Attending: Vascular Surgery

## 2020-03-23 ENCOUNTER — Ambulatory Visit: Payer: Medicare Other | Admitting: Certified Registered"

## 2020-03-23 ENCOUNTER — Other Ambulatory Visit
Admission: RE | Admit: 2020-03-23 | Discharge: 2020-03-23 | Disposition: A | Discharge status: Home / Self Care | Payer: Medicare Other | Source: Ambulatory Visit | Attending: Vascular Surgery | Admitting: Vascular Surgery

## 2020-03-23 ENCOUNTER — Encounter: Payer: Self-pay | Admitting: Vascular Surgery

## 2020-03-23 ENCOUNTER — Other Ambulatory Visit (INDEPENDENT_AMBULATORY_CARE_PROVIDER_SITE_OTHER): Payer: Self-pay | Admitting: Vascular Surgery

## 2020-03-23 ENCOUNTER — Other Ambulatory Visit: Payer: Self-pay

## 2020-03-23 ENCOUNTER — Inpatient Hospital Stay
Admission: RE | Admit: 2020-03-23 | Discharge: 2020-03-31 | DRG: 253 | Disposition: A | Discharge status: Home / Self Care | Payer: Medicare Other | Attending: Vascular Surgery | Admitting: Vascular Surgery

## 2020-03-23 DIAGNOSIS — Z981 Arthrodesis status: Secondary | ICD-10-CM | POA: Diagnosis not present

## 2020-03-23 DIAGNOSIS — E782 Mixed hyperlipidemia: Secondary | ICD-10-CM | POA: Diagnosis present

## 2020-03-23 DIAGNOSIS — I998 Other disorder of circulatory system: Secondary | ICD-10-CM | POA: Diagnosis not present

## 2020-03-23 DIAGNOSIS — Z87891 Personal history of nicotine dependence: Secondary | ICD-10-CM | POA: Diagnosis not present

## 2020-03-23 DIAGNOSIS — Z87442 Personal history of urinary calculi: Secondary | ICD-10-CM

## 2020-03-23 DIAGNOSIS — K5900 Constipation, unspecified: Secondary | ICD-10-CM | POA: Diagnosis not present

## 2020-03-23 DIAGNOSIS — Z538 Procedure and treatment not carried out for other reasons: Secondary | ICD-10-CM | POA: Diagnosis not present

## 2020-03-23 DIAGNOSIS — Z79899 Other long term (current) drug therapy: Secondary | ICD-10-CM | POA: Diagnosis not present

## 2020-03-23 DIAGNOSIS — I70201 Unspecified atherosclerosis of native arteries of extremities, right leg: Principal | ICD-10-CM | POA: Diagnosis present

## 2020-03-23 DIAGNOSIS — Z20822 Contact with and (suspected) exposure to covid-19: Secondary | ICD-10-CM | POA: Diagnosis present

## 2020-03-23 DIAGNOSIS — E785 Hyperlipidemia, unspecified: Secondary | ICD-10-CM | POA: Diagnosis not present

## 2020-03-23 DIAGNOSIS — G959 Disease of spinal cord, unspecified: Secondary | ICD-10-CM | POA: Diagnosis not present

## 2020-03-23 DIAGNOSIS — Z7982 Long term (current) use of aspirin: Secondary | ICD-10-CM | POA: Diagnosis not present

## 2020-03-23 DIAGNOSIS — J449 Chronic obstructive pulmonary disease, unspecified: Secondary | ICD-10-CM | POA: Diagnosis not present

## 2020-03-23 DIAGNOSIS — T82868A Thrombosis of vascular prosthetic devices, implants and grafts, initial encounter: Secondary | ICD-10-CM | POA: Diagnosis not present

## 2020-03-23 DIAGNOSIS — I70211 Atherosclerosis of native arteries of extremities with intermittent claudication, right leg: Secondary | ICD-10-CM | POA: Diagnosis not present

## 2020-03-23 DIAGNOSIS — I10 Essential (primary) hypertension: Secondary | ICD-10-CM | POA: Diagnosis not present

## 2020-03-23 HISTORY — PX: FEMORAL-TIBIAL BYPASS GRAFT: SHX938

## 2020-03-23 HISTORY — PX: LOWER EXTREMITY ANGIOGRAPHY: CATH118251

## 2020-03-23 LAB — CBC
HCT: 37.1 % — ABNORMAL LOW (ref 39.0–52.0)
HCT: 36.6 % — ABNORMAL LOW (ref 39.0–52.0)
Hemoglobin: 11.6 g/dL — ABNORMAL LOW (ref 13.0–17.0)
Hemoglobin: 12.2 g/dL — ABNORMAL LOW (ref 13.0–17.0)
MCH: 25.4 pg — ABNORMAL LOW (ref 26.0–34.0)
MCH: 26.5 pg (ref 26.0–34.0)
MCHC: 31.3 g/dL (ref 30.0–36.0)
MCHC: 33.3 g/dL (ref 30.0–36.0)
MCV: 81.2 fL (ref 80.0–100.0)
MCV: 79.6 fL — ABNORMAL LOW (ref 80.0–100.0)
Platelets: 262 10*3/uL (ref 150–400)
Platelets: 233 10*3/uL (ref 150–400)
RBC: 4.57 MIL/uL (ref 4.22–5.81)
RBC: 4.6 MIL/uL (ref 4.22–5.81)
RDW: 14.6 % (ref 11.5–15.5)
RDW: 14.6 % (ref 11.5–15.5)
WBC: 8 10*3/uL (ref 4.0–10.5)
WBC: 12.1 10*3/uL — ABNORMAL HIGH (ref 4.0–10.5)
nRBC: 0 % (ref 0.0–0.2)
nRBC: 0 % (ref 0.0–0.2)

## 2020-03-23 LAB — BASIC METABOLIC PANEL
Anion gap: 11 (ref 5–15)
BUN: 18 mg/dL (ref 8–23)
CO2: 27 mmol/L (ref 22–32)
Calcium: 8.6 mg/dL — ABNORMAL LOW (ref 8.9–10.3)
Chloride: 100 mmol/L (ref 98–111)
Creatinine, Ser: 0.87 mg/dL (ref 0.61–1.24)
GFR calc Af Amer: >60 mL/min (ref >60)
GFR calc non Af Amer: >60 mL/min (ref >60)
Glucose, Bld: 92 mg/dL (ref 70–99)
Potassium: 4.3 mmol/L (ref 3.5–5.1)
Sodium: 138 mmol/L (ref 135–145)

## 2020-03-23 LAB — BUN: BUN: 19 mg/dL (ref 8–23)

## 2020-03-23 LAB — CREATININE, SERUM
Creatinine, Ser: 0.91 mg/dL (ref 0.61–1.24)
GFR calc Af Amer: >60 mL/min (ref >60)
GFR calc non Af Amer: >60 mL/min (ref >60)

## 2020-03-23 LAB — SARS CORONAVIRUS 2 BY RT PCR (HOSPITAL ORDER, PERFORMED IN ~~LOC~~ HOSPITAL LAB): SARS Coronavirus 2: NEGATIVE

## 2020-03-23 SURGERY — CREATION, BYPASS, ARTERIAL, FEMORAL TO TIBIAL, USING GRAFT
Anesthesia: General | Laterality: Right

## 2020-03-23 SURGERY — LOWER EXTREMITY ANGIOGRAPHY
Anesthesia: Moderate Sedation | Laterality: Right

## 2020-03-23 MED ORDER — PROPOFOL 10 MG/ML IV BOLUS
INTRAVENOUS | Status: DC | PRN
  Administered 2020-03-23: 150 mg via INTRAVENOUS

## 2020-03-23 MED ORDER — CHLORHEXIDINE GLUCONATE CLOTH 2 % EX PADS: 6.0000 | MEDICATED_PAD | Freq: Once | CUTANEOUS | Status: AC

## 2020-03-23 MED ORDER — VISTASEAL 10 ML SINGLE DOSE KIT
PACK | CUTANEOUS | Status: DC | PRN
  Administered 2020-03-23: 10 mL via TOPICAL

## 2020-03-23 MED ORDER — GLYCOPYRROLATE 0.2 MG/ML IJ SOLN
INTRAMUSCULAR | Status: DC | PRN
  Administered 2020-03-23 (×2): .2 mg via INTRAVENOUS

## 2020-03-23 MED ORDER — MIDAZOLAM HCL 2 MG/2ML IJ SOLN
INTRAMUSCULAR | Status: DC | PRN
  Administered 2020-03-23 (×2): 2 mg via INTRAVENOUS
  Administered 2020-03-23 (×2): 1 mg via INTRAVENOUS

## 2020-03-23 MED ORDER — ROCURONIUM BROMIDE 100 MG/10ML IV SOLN
INTRAVENOUS | Status: DC | PRN
  Administered 2020-03-23 (×2): 30 mg via INTRAVENOUS

## 2020-03-23 MED ORDER — MIDAZOLAM HCL 2 MG/ML PO SYRP: 8.0000 mg | ORAL_SOLUTION | Freq: Once | ORAL | Status: DC | PRN

## 2020-03-23 MED ORDER — LACTATED RINGERS IV SOLN: INTRAVENOUS | Status: DC | PRN

## 2020-03-23 MED ORDER — OXYCODONE HCL 5 MG/5ML PO SOLN: 5.0000 mg | Freq: Once | ORAL | Status: DC | PRN

## 2020-03-23 MED ORDER — FENTANYL CITRATE (PF) 100 MCG/2ML IJ SOLN
INTRAMUSCULAR | Status: AC
  Filled 2020-03-23: qty 4

## 2020-03-23 MED ORDER — ONDANSETRON HCL 4 MG/2ML IJ SOLN: 4.0000 mg | Freq: Four times a day (QID) | INTRAMUSCULAR | Status: DC | PRN

## 2020-03-23 MED ORDER — METOPROLOL TARTRATE 5 MG/5ML IV SOLN: 2.0000 mg | INTRAVENOUS | Status: DC | PRN

## 2020-03-23 MED ORDER — GUAIFENESIN-DM 100-10 MG/5ML PO SYRP: 15.0000 mL | ORAL_SOLUTION | ORAL | Status: DC | PRN

## 2020-03-23 MED ORDER — PHENOL 1.4 % MT LIQD
1.0000 | OROMUCOSAL | Status: DC | PRN
  Filled 2020-03-23: qty 177

## 2020-03-23 MED ORDER — SORBITOL 70 % SOLN
30.0000 mL | Freq: Every day | Status: DC | PRN
  Filled 2020-03-23: qty 30

## 2020-03-23 MED ORDER — VASOPRESSIN 20 UNIT/ML IV SOLN
INTRAVENOUS | Status: DC | PRN
  Administered 2020-03-23 (×4): 2 [IU] via INTRAVENOUS

## 2020-03-23 MED ORDER — SODIUM CHLORIDE 0.9 % IV SOLN: INTRAVENOUS | Status: AC

## 2020-03-23 MED ORDER — SODIUM CHLORIDE 0.9 % IV SOLN
INTRAVENOUS | Status: DC | PRN
  Administered 2020-03-23: 50 ug/min via INTRAVENOUS

## 2020-03-23 MED ORDER — CEFAZOLIN SODIUM-DEXTROSE 2-3 GM-%(50ML) IV SOLR
INTRAVENOUS | Status: DC | PRN
  Administered 2020-03-23: 2 g via INTRAVENOUS

## 2020-03-23 MED ORDER — CEFAZOLIN SODIUM-DEXTROSE 1-4 GM/50ML-% IV SOLN
1.0000 g | Freq: Three times a day (TID) | INTRAVENOUS | Status: AC
  Administered 2020-03-24: 1 g via INTRAVENOUS
  Filled 2020-03-23 (×2): qty 50

## 2020-03-23 MED ORDER — LABETALOL HCL 5 MG/ML IV SOLN: 10.0000 mg | INTRAVENOUS | Status: DC | PRN

## 2020-03-23 MED ORDER — GABAPENTIN 300 MG PO CAPS
300.0000 mg | ORAL_CAPSULE | Freq: Every day | ORAL | Status: DC
  Administered 2020-03-23 – 2020-03-30 (×8): 300 mg via ORAL
  Filled 2020-03-23 (×7): qty 1

## 2020-03-23 MED ORDER — HYDRALAZINE HCL 20 MG/ML IJ SOLN: 5.0000 mg | INTRAMUSCULAR | Status: DC | PRN

## 2020-03-23 MED ORDER — HEPARIN SODIUM (PORCINE) 1000 UNIT/ML IJ SOLN
INTRAMUSCULAR | Status: DC | PRN
  Administered 2020-03-23: 5000 [IU] via INTRAVENOUS

## 2020-03-23 MED ORDER — METHYLPREDNISOLONE SODIUM SUCC 125 MG IJ SOLR: 125.0000 mg | Freq: Once | INTRAMUSCULAR | Status: DC | PRN

## 2020-03-23 MED ORDER — ALBUMIN HUMAN 5 % IV SOLN
INTRAVENOUS | Status: AC
  Filled 2020-03-23: qty 250

## 2020-03-23 MED ORDER — IODIXANOL 320 MG/ML IV SOLN
INTRAVENOUS | Status: DC | PRN
  Administered 2020-03-23: 40 mL

## 2020-03-23 MED ORDER — FENTANYL CITRATE (PF) 100 MCG/2ML IJ SOLN: 25.0000 ug | INTRAMUSCULAR | Status: DC | PRN

## 2020-03-23 MED ORDER — FAMOTIDINE 20 MG PO TABS: 40.0000 mg | ORAL_TABLET | Freq: Once | ORAL | Status: DC | PRN

## 2020-03-23 MED ORDER — SENNOSIDES-DOCUSATE SODIUM 8.6-50 MG PO TABS: 1.0000 | ORAL_TABLET | Freq: Every evening | ORAL | Status: DC | PRN

## 2020-03-23 MED ORDER — CEFAZOLIN SODIUM-DEXTROSE 2-4 GM/100ML-% IV SOLN: 2.0000 g | INTRAVENOUS | Status: DC

## 2020-03-23 MED ORDER — FENTANYL CITRATE (PF) 100 MCG/2ML IJ SOLN
INTRAMUSCULAR | Status: DC | PRN
Start: 2020-03-23 — End: 2020-03-23
  Administered 2020-03-23 (×2): 50 ug via INTRAVENOUS

## 2020-03-23 MED ORDER — POTASSIUM CHLORIDE CRYS ER 20 MEQ PO TBCR: 20.0000 meq | EXTENDED_RELEASE_TABLET | Freq: Every day | ORAL | Status: DC | PRN

## 2020-03-23 MED ORDER — ACETAMINOPHEN 325 MG RE SUPP
325.0000 mg | RECTAL | Status: DC | PRN
  Filled 2020-03-23: qty 2

## 2020-03-23 MED ORDER — SODIUM CHLORIDE 0.9 % IV SOLN
INTRAVENOUS | Status: DC | PRN
  Administered 2020-03-23: 400 mL via INTRAMUSCULAR

## 2020-03-23 MED ORDER — FENTANYL CITRATE (PF) 100 MCG/2ML IJ SOLN
INTRAMUSCULAR | Status: DC | PRN
Start: 2020-03-23 — End: 2020-03-23
  Administered 2020-03-23: 100 ug via INTRAVENOUS
  Administered 2020-03-23 (×2): 50 ug via INTRAVENOUS

## 2020-03-23 MED ORDER — DEXAMETHASONE SODIUM PHOSPHATE 10 MG/ML IJ SOLN
INTRAMUSCULAR | Status: AC
  Filled 2020-03-23: qty 1

## 2020-03-23 MED ORDER — ASPIRIN EC 81 MG PO TBEC
81.0000 mg | DELAYED_RELEASE_TABLET | Freq: Every day | ORAL | Status: DC
  Administered 2020-03-23 – 2020-03-31 (×8): 81 mg via ORAL
  Filled 2020-03-23 (×8): qty 1

## 2020-03-23 MED ORDER — SODIUM CHLORIDE 0.9 % IV SOLN: 500.0000 mL | Freq: Once | INTRAVENOUS | Status: DC | PRN

## 2020-03-23 MED ORDER — DIPHENHYDRAMINE HCL 50 MG/ML IJ SOLN: 50.0000 mg | Freq: Once | INTRAMUSCULAR | Status: DC | PRN

## 2020-03-23 MED ORDER — VISTASEAL 10 ML SINGLE DOSE KIT
PACK | CUTANEOUS | Status: AC
  Filled 2020-03-23: qty 10

## 2020-03-23 MED ORDER — MAGNESIUM SULFATE 2 GM/50ML IV SOLN: 2.0000 g | Freq: Every day | INTRAVENOUS | Status: DC | PRN

## 2020-03-23 MED ORDER — MIDAZOLAM HCL 2 MG/2ML IJ SOLN
INTRAMUSCULAR | Status: AC
  Filled 2020-03-23: qty 2

## 2020-03-23 MED ORDER — HEPARIN (PORCINE) 25000 UT/250ML-% IV SOLN
500.0000 [IU]/h | INTRAVENOUS | Status: DC
  Administered 2020-03-23: 500 [IU]/h via INTRAVENOUS
  Filled 2020-03-23 (×2): qty 250

## 2020-03-23 MED ORDER — ACETAMINOPHEN 325 MG PO TABS
325.0000 mg | ORAL_TABLET | ORAL | Status: DC | PRN
  Administered 2020-03-24 – 2020-03-30 (×10): 650 mg via ORAL
  Filled 2020-03-23 (×11): qty 2

## 2020-03-23 MED ORDER — HEPARIN SODIUM (PORCINE) 1000 UNIT/ML IJ SOLN
INTRAMUSCULAR | Status: AC
  Filled 2020-03-23: qty 1

## 2020-03-23 MED ORDER — DEXAMETHASONE SODIUM PHOSPHATE 10 MG/ML IJ SOLN
INTRAMUSCULAR | Status: DC | PRN
  Administered 2020-03-23: 5 mg via INTRAVENOUS

## 2020-03-23 MED ORDER — METHOCARBAMOL 500 MG PO TABS
500.0000 mg | ORAL_TABLET | Freq: Four times a day (QID) | ORAL | Status: DC | PRN
  Filled 2020-03-23: qty 1

## 2020-03-23 MED ORDER — SIMVASTATIN 20 MG PO TABS
20.0000 mg | ORAL_TABLET | Freq: Every day | ORAL | Status: DC
  Administered 2020-03-24 – 2020-03-30 (×7): 20 mg via ORAL
  Filled 2020-03-23 (×8): qty 1

## 2020-03-23 MED ORDER — MIDAZOLAM HCL 5 MG/5ML IJ SOLN
INTRAMUSCULAR | Status: AC
  Filled 2020-03-23: qty 5

## 2020-03-23 MED ORDER — PROPOFOL 10 MG/ML IV BOLUS
INTRAVENOUS | Status: AC
  Filled 2020-03-23: qty 20

## 2020-03-23 MED ORDER — ALUM & MAG HYDROXIDE-SIMETH 200-200-20 MG/5ML PO SUSP: 15.0000 mL | ORAL | Status: DC | PRN

## 2020-03-23 MED ORDER — MORPHINE SULFATE (PF) 2 MG/ML IV SOLN
2.0000 mg | INTRAVENOUS | Status: DC | PRN
  Administered 2020-03-26: 2 mg via INTRAVENOUS
  Filled 2020-03-23: qty 1

## 2020-03-23 MED ORDER — SUCCINYLCHOLINE CHLORIDE 20 MG/ML IJ SOLN
INTRAMUSCULAR | Status: DC | PRN
  Administered 2020-03-23: 100 mg via INTRAVENOUS

## 2020-03-23 MED ORDER — MIDAZOLAM HCL 2 MG/2ML IJ SOLN
INTRAMUSCULAR | Status: DC | PRN
  Administered 2020-03-23: 2 mg via INTRAVENOUS

## 2020-03-23 MED ORDER — DOCUSATE SODIUM 100 MG PO CAPS
100.0000 mg | ORAL_CAPSULE | Freq: Every day | ORAL | Status: DC
  Administered 2020-03-25 – 2020-03-31 (×5): 100 mg via ORAL
  Filled 2020-03-23 (×7): qty 1

## 2020-03-23 MED ORDER — SODIUM CHLORIDE 0.9 % IV SOLN: INTRAVENOUS | Status: DC

## 2020-03-23 MED ORDER — HEPARIN SODIUM (PORCINE) 1000 UNIT/ML IJ SOLN
INTRAMUSCULAR | Status: DC | PRN
  Administered 2020-03-23: 5000 [IU] via INTRAVENOUS
  Administered 2020-03-23: 2000 [IU] via INTRAVENOUS

## 2020-03-23 MED ORDER — FENTANYL CITRATE (PF) 100 MCG/2ML IJ SOLN
INTRAMUSCULAR | Status: AC
  Filled 2020-03-23: qty 2

## 2020-03-23 MED ORDER — ENOXAPARIN SODIUM 40 MG/0.4ML ~~LOC~~ SOLN: 40.0000 mg | SUBCUTANEOUS | Status: DC

## 2020-03-23 MED ORDER — ALBUMIN HUMAN 5 % IV SOLN: INTRAVENOUS | Status: DC | PRN

## 2020-03-23 MED ORDER — OXYCODONE HCL 5 MG PO TABS: 5.0000 mg | ORAL_TABLET | Freq: Once | ORAL | Status: DC | PRN

## 2020-03-23 MED ORDER — DOPAMINE-DEXTROSE 3.2-5 MG/ML-% IV SOLN: 3.0000 ug/kg/min | INTRAVENOUS | Status: DC

## 2020-03-23 MED ORDER — HYDROMORPHONE HCL 1 MG/ML IJ SOLN: 1.0000 mg | Freq: Once | INTRAMUSCULAR | Status: DC | PRN

## 2020-03-23 MED ORDER — OXYCODONE-ACETAMINOPHEN 5-325 MG PO TABS
1.0000 | ORAL_TABLET | ORAL | Status: DC | PRN
  Administered 2020-03-24 – 2020-03-27 (×6): 2 via ORAL
  Administered 2020-03-30 (×2): 1 via ORAL
  Filled 2020-03-23 (×2): qty 1
  Filled 2020-03-23 (×6): qty 2

## 2020-03-23 MED ORDER — GABAPENTIN 300 MG PO CAPS
ORAL_CAPSULE | ORAL | Status: AC
  Filled 2020-03-23: qty 1

## 2020-03-23 MED ORDER — FAMOTIDINE IN NACL 20-0.9 MG/50ML-% IV SOLN
20.0000 mg | Freq: Two times a day (BID) | INTRAVENOUS | Status: DC
  Administered 2020-03-23 – 2020-03-27 (×8): 20 mg via INTRAVENOUS
  Filled 2020-03-23 (×8): qty 50

## 2020-03-23 MED ORDER — CEFAZOLIN SODIUM-DEXTROSE 2-4 GM/100ML-% IV SOLN
2.0000 g | Freq: Once | INTRAVENOUS | Status: AC
  Administered 2020-03-23: 2 g via INTRAVENOUS

## 2020-03-23 MED ORDER — LIDOCAINE HCL (CARDIAC) PF 100 MG/5ML IV SOSY
PREFILLED_SYRINGE | INTRAVENOUS | Status: DC | PRN
  Administered 2020-03-23: 80 mg via INTRAVENOUS

## 2020-03-23 MED ORDER — SUGAMMADEX SODIUM 200 MG/2ML IV SOLN
INTRAVENOUS | Status: DC | PRN
  Administered 2020-03-23: 200 mg via INTRAVENOUS

## 2020-03-23 MED ORDER — NITROGLYCERIN IN D5W 200-5 MCG/ML-% IV SOLN: 5.0000 ug/min | INTRAVENOUS | Status: DC

## 2020-03-23 SURGICAL SUPPLY — 90 items
APPLIER CLIP 11 MED OPEN (CLIP)
APPLIER CLIP 9.375 SM OPEN (CLIP)
BAG DECANTER FOR FLEXI CONT (MISCELLANEOUS) ×2 IMPLANT
BAG ISOLATATION DRAPE 20X20 ST (DRAPES) ×1 IMPLANT
BLADE SURG 15 STRL LF DISP TIS (BLADE) ×1 IMPLANT
BLADE SURG 15 STRL SS (BLADE) ×1
BLADE SURG SZ11 CARB STEEL (BLADE) ×2 IMPLANT
BOOT SUTURE AID YELLOW STND (SUTURE) ×2 IMPLANT
BRUSH SCRUB EZ  4% CHG (MISCELLANEOUS) ×1
BRUSH SCRUB EZ 4% CHG (MISCELLANEOUS) ×1 IMPLANT
CANISTER SUCT 1200ML W/VALVE (MISCELLANEOUS) ×2 IMPLANT
CANISTER WOUND CARE 500ML ATS (WOUND CARE) IMPLANT
CAP TUBING WOUND VAC TRAC (MISCELLANEOUS) IMPLANT
CATH EMBOLECTOMY 2X60 (CATHETERS) ×2 IMPLANT
CHLORAPREP W/TINT 26 (MISCELLANEOUS) IMPLANT
CLIP APPLIE 11 MED OPEN (CLIP) IMPLANT
CLIP APPLIE 9.375 SM OPEN (CLIP) IMPLANT
CONNECTOR Y WND VAC (MISCELLANEOUS) IMPLANT
COVER WAND RF STERILE (DRAPES) ×2 IMPLANT
DERMABOND ADVANCED (GAUZE/BANDAGES/DRESSINGS)
DERMABOND ADVANCED .7 DNX12 (GAUZE/BANDAGES/DRESSINGS) IMPLANT
DRAPE 3/4 80X56 (DRAPES) ×2 IMPLANT
DRAPE INCISE IOBAN 66X45 STRL (DRAPES) ×4 IMPLANT
DRAPE ISOLATE BAG 20X20 STRL (DRAPES) ×1
DRESSING SURGICEL FIBRLLR 1X2 (HEMOSTASIS) ×3 IMPLANT
DRSG OPSITE POSTOP 4X10 (GAUZE/BANDAGES/DRESSINGS) ×2 IMPLANT
DRSG OPSITE POSTOP 4X6 (GAUZE/BANDAGES/DRESSINGS) ×2 IMPLANT
DRSG OPSITE POSTOP 4X8 (GAUZE/BANDAGES/DRESSINGS) ×2 IMPLANT
DRSG SURGICEL FIBRILLAR 1X2 (HEMOSTASIS) ×6
DRSG TEGADERM 2-3/8X2-3/4 SM (GAUZE/BANDAGES/DRESSINGS) ×2 IMPLANT
DRSG VAC ATS LRG SENSATRAC (GAUZE/BANDAGES/DRESSINGS) IMPLANT
ELECT CAUTERY BLADE 6.4 (BLADE) ×2 IMPLANT
ELECT REM PT RETURN 9FT ADLT (ELECTROSURGICAL) ×2
ELECTRODE REM PT RTRN 9FT ADLT (ELECTROSURGICAL) ×1 IMPLANT
GAUZE 4X4 16PLY RFD (DISPOSABLE) ×2 IMPLANT
GEL ULTRASOUND 20GR AQUASONIC (MISCELLANEOUS) IMPLANT
GLOVE BIO SURGEON STRL SZ7 (GLOVE) ×2 IMPLANT
GLOVE INDICATOR 7.5 STRL GRN (GLOVE) ×2 IMPLANT
GLOVE SURG SYN 8.0 (GLOVE) ×2 IMPLANT
GOWN STRL REUS W/ TWL LRG LVL3 (GOWN DISPOSABLE) ×2 IMPLANT
GOWN STRL REUS W/ TWL XL LVL3 (GOWN DISPOSABLE) ×2 IMPLANT
GOWN STRL REUS W/TWL LRG LVL3 (GOWN DISPOSABLE) ×2
GOWN STRL REUS W/TWL XL LVL3 (GOWN DISPOSABLE) ×2
GRAFT VASC DISTAFLO 6X90 (Graft) ×2 IMPLANT
HEAD CUTTING 'VALVULOTOME URSL (MISCELLANEOUS) IMPLANT
IV NS 500ML (IV SOLUTION) ×1
IV NS 500ML BAXH (IV SOLUTION) ×1 IMPLANT
KIT TURNOVER KIT A (KITS) ×2 IMPLANT
LABEL OR SOLS (LABEL) ×2 IMPLANT
LOOP RED MAXI  1X406MM (MISCELLANEOUS) ×1
LOOP VESSEL MAXI 1X406 RED (MISCELLANEOUS) ×1 IMPLANT
LOOP VESSEL MINI 0.8X406 BLUE (MISCELLANEOUS) ×1 IMPLANT
LOOPS BLUE MINI 0.8X406MM (MISCELLANEOUS) ×1
NEEDLE FILTER BLUNT 18X 1/2SAF (NEEDLE) ×1
NEEDLE FILTER BLUNT 18X1 1/2 (NEEDLE) ×1 IMPLANT
NS IRRIG 1000ML POUR BTL (IV SOLUTION) ×2 IMPLANT
PACK BASIN MAJOR ARMC (MISCELLANEOUS) ×2 IMPLANT
PACK UNIVERSAL (MISCELLANEOUS) ×2 IMPLANT
PAD PREP 24X41 OB/GYN DISP (PERSONAL CARE ITEMS) ×2 IMPLANT
PENCIL ELECTRO HAND CTR (MISCELLANEOUS) ×2 IMPLANT
SPONGE LAP 18X18 RF (DISPOSABLE) ×2 IMPLANT
STAPLER SKIN PROX 35W (STAPLE) ×2 IMPLANT
SUT ETHIBOND CT1 BRD #0 30IN (SUTURE) IMPLANT
SUT GORETEX 6.0 TT9 (SUTURE) ×2 IMPLANT
SUT GTX CV-7 30 3/8 TAPER (SUTURE) ×2 IMPLANT
SUT MNCRL+ 5-0 UNDYED PC-3 (SUTURE) IMPLANT
SUT MONOCRYL 5-0 (SUTURE)
SUT PROLENE 3 0 SH DA (SUTURE) ×2 IMPLANT
SUT PROLENE 5 0 RB 1 DA (SUTURE) ×2 IMPLANT
SUT PROLENE 6 0 BV (SUTURE) ×8 IMPLANT
SUT PROLENE 7 0 BV 1 (SUTURE) IMPLANT
SUT SILK 2 0 (SUTURE) ×1
SUT SILK 2 0 SH (SUTURE) ×2 IMPLANT
SUT SILK 2-0 18XBRD TIE 12 (SUTURE) ×1 IMPLANT
SUT SILK 3 0 (SUTURE) ×2
SUT SILK 3-0 18XBRD TIE 12 (SUTURE) ×2 IMPLANT
SUT SILK 4 0 (SUTURE) ×1
SUT SILK 4-0 18XBRD TIE 12 (SUTURE) ×1 IMPLANT
SUT VIC AB 2-0 CT1 (SUTURE) ×4 IMPLANT
SUT VIC AB 3-0 SH 27 (SUTURE) ×1
SUT VIC AB 3-0 SH 27X BRD (SUTURE) ×1 IMPLANT
SUT VICRYL+ 3-0 36IN CT-1 (SUTURE) ×4 IMPLANT
SYR 20ML LL LF (SYRINGE) ×2 IMPLANT
SYR 3ML LL SCALE MARK (SYRINGE) ×2 IMPLANT
TAPE UMBIL 1/8X18 RADIOPA (MISCELLANEOUS) IMPLANT
TOWEL OR 17X26 4PK STRL BLUE (TOWEL DISPOSABLE) ×2 IMPLANT
TRAY FOLEY MTR SLVR 16FR STAT (SET/KITS/TRAYS/PACK) ×2 IMPLANT
VALVULOTOME URESIL (MISCELLANEOUS)
WND VAC CAP TUBING TRAC (MISCELLANEOUS)
WND VAC CONN Y (MISCELLANEOUS)

## 2020-03-23 SURGICAL SUPPLY — 25 items
BALLN ULTRVRSE 130X300X6 (BALLOONS) ×1
BALLN ULTRVRSE 6X300X130 (BALLOONS) ×1
BALLOON ULTRVRSE 130X300X6 (BALLOONS) ×1 IMPLANT
CANISTER PENUMBRA ENGINE (MISCELLANEOUS) ×4 IMPLANT
CATH ANGIO 5F PIGTAIL 65CM (CATHETERS) ×2 IMPLANT
CATH BEACON 5 .035 65 KMP TIP (CATHETERS) ×2 IMPLANT
CATH INDIGO SEP 7 (CATHETERS) ×2 IMPLANT
CATH LIGHTNING 7 XTORQ 130 (CATHETERS) ×2 IMPLANT
COVER PROBE U/S 5X48 (MISCELLANEOUS) ×2 IMPLANT
DEVICE PRESTO INFLATION (MISCELLANEOUS) ×2 IMPLANT
DEVICE STARCLOSE SE CLOSURE (Vascular Products) ×2 IMPLANT
DEVICE TORQUE .025-.038 (MISCELLANEOUS) ×2 IMPLANT
GLIDECATH 4FR STR (CATHETERS) ×2 IMPLANT
GLIDEWIRE ADV .035X260CM (WIRE) ×2 IMPLANT
NEEDLE ENTRY 21GA 7CM ECHOTIP (NEEDLE) ×2 IMPLANT
PACK ANGIOGRAPHY (CUSTOM PROCEDURE TRAY) ×2 IMPLANT
SET INTRO CAPELLA COAXIAL (SET/KITS/TRAYS/PACK) ×2 IMPLANT
SHEATH BRITE TIP 5FRX11 (SHEATH) ×2 IMPLANT
SHEATH FLEXOR ANSEL2 7FRX45 (SHEATH) ×2 IMPLANT
SHEATH PINNACLE ST 7F 65CM (SHEATH) ×2 IMPLANT
SYR MEDRAD MARK 7 150ML (SYRINGE) ×2 IMPLANT
TUBING CONTRAST HIGH PRESS 72 (TUBING) ×2 IMPLANT
WIRE G V18X300CM (WIRE) ×2 IMPLANT
WIRE J 3MM .035X145CM (WIRE) ×2 IMPLANT
WIRE ROSEN-J .035X260CM (WIRE) ×2 IMPLANT

## 2020-03-23 NOTE — Progress Notes (Signed)
No longer able to doppler right posterior tibial and dorsalis pedis pulses. Dr. Trula Slade notified. Order received to administer heparin and notify MD if pt c/o pain or numbness to right foot or if right foot becomes mottled.

## 2020-03-23 NOTE — Op Note (Signed)
[facility]    OPERATIVE NOTE   PROCEDURE: 1. right profunda femoral artery to right posterior tibial artery bypass with 6 mm distal flow graft 2. 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   ASSISTANT(S): Ms. Hezzie Bump  ANESTHESIA: general  ESTIMATED BLOOD LOSS: 40 cc  FINDING(S): Thrombus retrieved from within the posterior tibial artery   SPECIMEN(S): Thrombus  INDICATIONS:   Joshua [name] is a 64 y.o. male who presents with occlusion of his previously placed femoral to tibial bypass.  He has undergone multiple interventions in the past.  Today he presented with occlusion and subsequently minimally invasive/interventional techniques were not successful and his foot remained ischemic.  I therefore brought him to the operating room for formal femoral distal bypass. The risk, benefits, and alternative for bypass operations were discussed with the patient.  The patient is aware the risks include but are not limited to: bleeding, infection, myocardial infarction, stroke, limb loss, nerve damage, need for additional procedures in the future, wound complications, and inability to complete the bypass.  The patient is voices understanding of these risks and agreed to proceed.  DESCRIPTION: After informed consent was obtained, the patient was brought back to the operating room and placed in the supine position.  Prior to induction, the patient was given intravenous antibiotics.  After general anesthesia is induced, the patient was prepped and draped in the standard fashion for a femoral to popliteal bypass operation.  Appropriate timeout is called.     Attention was turned to the right groin.  A longitudinal incision was made encompassing the distal half of the previous scar and extending it more distally over femoral artery.  Using blunt dissection and electrocautery, the profunda femoris was identified and the artery was dissected circumferentially looping the main trunk proximally and large branches distally.  Enough of the artery was dissected to allow for a anastomosis with the PTFE graft.   Attention was then turned to the distal medial thigh.  An longitudinal incision was made over Hunter's canal.  Using blunt dissection and electrocautery, a plane was developed through the subcutaneous tissue and fascia down to Hunter's canal.  The above-the-knee popliteal artery was was identified. Femoral condyles were noted to allow for tunneling.   Medial incision was then made in the calf and the posterior tibial artery was identified. It was dissected circumferentially and looped with Silastic vessel loops. A tunneler was then passed and a Bard distal flow graft was then pulled through the tunnel. The distal end was approximated to the tibial artery.  The patient was given 5000 units of Heparin intravenously, and this is allowed to circulate for proximally 5 minutes.  Attention was then turned to the tibial target site.   Silastic Vesseloops were used for control.  Arteriotomy was made with a 11-blade and extended with Potts scissors.  #2 Fogarty was then delivered onto the field.  Fogarty balloon catheter was then passed distally 2 passes were made.  It was then passed proximally a total of 4 passes were made.  All of the thrombus that was retrieved was placed in a specimen cup and passed off the field.  Having successfully reestablished patency of the posterior tibial artery move forward with  creating the distal anastomosis.  The distal end of the conduit was approximated to the arteriotomy and then an end graft to side posterior tibial artery  anastomosis was fashioned using running CV 7 suture.  A gap was left in the medial suture line to allow for flushing once the proximal anastomosis was completed.  The Silastic Vesseloops were then placed under tension palpation of the profunda femoris revealed that the pulse was gone indicating adequate control.  Arteriotomy is then made in the profunda femoral artery with an 11 blade and extended with Potts scissors.   The proximal and of the bypass vein was trimmed to the appropriate shape.  The PTFE graft was sewn to the profunda femoral artery in an end-to-side configuration with a running stitch of Gore CV 6 suture.  Prior to completing the suture line the anastomosis is flushed and subsequently the suture line is completed. Flow was then reestablished to the profunda femoris artery.  The anastomosis is checked for leaks and the distal flow graft is clamped proximally.  Subsequently, Surgicel was placed around the suture line. Attention was then turned to the distal end of the graft so the anastomosis could be completed.  I released the clamp and pulsatile bleeding from the graft was evident.  The distal anastomosis was then irrigated with saline and the CV 7 suture line completed.  Flow was reestablished through the graft first distally in the posterior tibial and then proximally down to the foot.  Palpation of the posterior tibial artery several centimeters below the anastomosis revealed an excellent pulse.  The wounds were then irrigated with sterile saline.  The wounds were then inspected for hemostasis. Bleeding points were controlled with electrocautery, and suture repair of active bleeding points.  Fibrillar and AdvaSeal were then placed in the bed of the wounds. Once hemostasis was achieved.  The groin incision was then closed in multiple layers using both 2-0 and 3-0 Vicryl in interrupted and running fashion. Skin was closed with a staples. The medial calf and medial thigh wound were then  closed layers using  3-0 Vicryl, and staples.   A honeycomb dressing was applied to each incision.   COMPLICATIONS: None  CONDITION: Stable  Katha Cabal, M.D. [facility] Office: [phone]  03/23/2020, 7:53 PM

## 2020-03-23 NOTE — Transfer of Care (Signed)
Immediate Anesthesia Transfer of Care Note  Patient: Joshua [name]  Procedure(s) Performed: BYPASS GRAFT RIGHT FEMORAL- DISTAL TIBIAL ARTERY WITH DISTAL FLOW GRAFT (Right )  Patient Location: PACU  Anesthesia Type:General  Level of Consciousness: awake, alert  and oriented  Airway & Oxygen Therapy: Patient Spontanous Breathing  Post-op Assessment: Report given to RN and Post -op Vital signs reviewed and stable  Post vital signs: Reviewed and stable  Last Vitals:  Vitals Value Taken Time  BP    Temp    Pulse    Resp    SpO2      Last Pain:  Vitals:   03/23/20 1530  TempSrc:   PainSc: 0-No pain         Complications: No complications documented.

## 2020-03-23 NOTE — Op Note (Signed)
Joshua [name] Percutaneous Study/Intervention Procedural Note   Date of Surgery: 03/23/2020  Surgeon:  Katha Cabal, MD.  Pre-operative Diagnosis: Ischemic right leg; thrombosis of previously placed vascular stents  Post-operative diagnosis: Same  Procedure(s) Performed: 1. Introduction catheter into right lower extremity 3rd order catheter placement  2. Contrast injection right lower extremity for distal runoff   3. Percutaneous transluminal angioplasty right femoral-tibial bypass to 6 mm  4. Mechanical thrombectomy with the penumbra CAT 7 device right femoral to tibial bypass             5.  Star close closure left common femoral arteriotomy  Anesthesia: Conscious sedation was administered under my direct supervision by the interventional radiology RN. IV Versed plus fentanyl were utilized. Continuous ECG, pulse oximetry and blood pressure was monitored throughout the entire procedure.  Conscious sedation was for a total of 117 minutes.  Sheath: 6 French destination left common femoral retrograde  Contrast: 40 cc  Fluoroscopy Time: 30.4 minutes  Indications: Joshua [name] presents with ischemia of the right foot secondary to thrombosis of his previous femoral-tibial bypass graft and stents.  This puts him at a high risk for limb loss.  Angiography with hope for intervention and thrombectomy has been recommended.  The risks and benefits are reviewed all questions answered patient agrees to proceed.  Procedure: Joshua [name] is a 64 y.o. y.o. male who was identified and appropriate procedural time out was performed. The patient was then placed supine on the table and prepped and draped in the usual sterile fashion.   Ultrasound was placed in the sterile sleeve and the left groin was evaluated the left common femoral artery was echolucent and pulsatile indicating patency.  Image was recorded  for the permanent record and under real-time visualization a microneedle was inserted into the common femoral artery microwire followed by a micro-sheath.  A J-wire was then advanced through the micro-sheath and a  5 [place] sheath was then inserted over a J-wire. J-wire was then advanced and a 5 French pigtail catheter was positioned at the level of T12. AP projection of the aorta was then obtained. Pigtail catheter was repositioned to above the bifurcation and a LAO view of the pelvis was obtained.  Subsequently a pigtail catheter with the stiff angle Glidewire was used to cross the aortic bifurcation the catheter wire were advanced down into the right distal external iliac artery. Oblique view of the femoral bifurcation was then obtained and subsequently the wire was reintroduced and the pigtail catheter negotiated into the SFA representing third order catheter placement. Distal runoff was then performed.  Diagnostic interpretation: You can go ahead and load  After cannulating the bypass and advancing a Kumpe to the distal outflow hand-injection contrast demonstrated the at the posterior tibial is patent the peroneal is patent in its proximal two thirds but it appears to occlude distally.  This is similar to the distal runoff previously identified.  5000 units of heparin was then given and allowed to circulate and a 6 [place] sheath was advanced up and over the bifurcation and positioned in the femoral artery  Using several different wires and catheters ultimately a Kumpe catheter and a Rosen wire is able to engage the stents and the catheter is advanced distally as noted above hand-injection contrast verified intraluminal positioning.  A V 18 wire was then introduced and the penumbra lightening cath 7 device was used 8-10 passes were made using a lightening.  Ultimately I elected to  advance a 6 mm x 30 mm balloon and angioplasty the bypass.  However I was not able to reestablish flow.  In fact there does  not appear to be any significant improvement.  Therefore I do not believe that this technique will provide a successful result.  Patient is only 9 days from cervical spine surgery and therefore TPA and to be 3A inhibitors are contraindicated.  I did advance the Kumpe catheter down to the distal anastomosis and performed a follow-up shock which demonstrated thrombus in the very proximal posterior tibial extending approximately 3 cm to 4 cm in length.  Below this the posterior tibial is preserved.  At this point I elected to terminate the procedure I will reassess the status of his limb but see the only option as redo redo femoral to posterior tibial bypass grafting.  The patient's already had his veins used for previous bicep past and therefore this will need to be prosthetic.  After review of these images the sheath is pulled into the left external iliac oblique of the common femoral is obtained and a Star close device deployed. There no immediate complications.   Findings:  The abdominal aorta is opacified with a bolus injection contrast.  There are no hemodynamically significant stenoses.  Bilateral common internal and external iliac arteries are widely patent.  The right common femoral is patent as is the profunda femoris.  Postoperative changes are noted.  Previously placed stents in both the native SFA as well as the femoral to tibial bypass graft are identified both sets of stents are thrombosed at their origin.  As noted above thrombectomy was unsuccessful.     Disposition: Patient was taken to the recovery room in stable condition having tolerated the procedure well.  Aydia Maj, Dolores Lory 03/23/2020,1:46 PM

## 2020-03-23 NOTE — Telephone Encounter (Signed)
I spoke with Joshua [name] by phone. I informed her that we were unable to successfully remove the thrombus from his bypass. Given the images that we obtained which demonstrates a widely patent common femoral and profunda femoris as well as a patent posterior tibial I have recommended to both the patient as well as Joshua [name] that we move forward with a redo femoral to posterior tibial bypass graft. We have already used the saphenous vein and therefore a prosthetic graft will be utilized. I have discussed alternatives including a period of observation with anticoagulation however at this time his foot has diminished sensation as well as diminished motor I believe that moving forward with bypass today is the most appropriate course of action. I have answered all Joshua [name] questions as well as all of Joshua [name] questions both are in agreement with moving forward.

## 2020-03-23 NOTE — Anesthesia Procedure Notes (Signed)
Procedure Name: Intubation Date/Time: 03/23/2020 4:52 PM Performed by: Jerrye Noble, CRNA Pre-anesthesia Checklist: Patient identified, Emergency Drugs available, Suction available and Patient being monitored Patient Re-evaluated:Patient Re-evaluated prior to induction Oxygen Delivery Method: Circle system utilized Preoxygenation: Pre-oxygenation with 100% oxygen Induction Type: IV induction and Rapid sequence Ventilation: Mask ventilation without difficulty Laryngoscope Size: McGraph and 4 Grade View: Grade I Tube type: Oral Tube size: 7.5 mm Number of attempts: 1 Airway Equipment and Method: Stylet and Video-laryngoscopy Placement Confirmation: ETT inserted through vocal cords under direct vision,  positive ETCO2 and breath sounds checked- equal and bilateral Secured at: 23 cm Tube secured with: Tape Dental Injury: Teeth and Oropharynx as per pre-operative assessment

## 2020-03-23 NOTE — Anesthesia Preprocedure Evaluation (Addendum)
Anesthesia Evaluation  Patient identified by MRN, date of birth, ID band Patient awake    Reviewed: Allergy & Precautions, H&P , NPO status , Patient's Chart, lab work & pertinent test results  History of Anesthesia Complications Negative for: history of anesthetic complications  Airway Mallampati: III  TM Distance: >3 FB Neck ROM: limited    Dental  (+) Chipped, Poor Dentition   Pulmonary shortness of breath and with exertion, COPD, former smoker,    Pulmonary exam normal        Cardiovascular hypertension, (-) angina+ Peripheral Vascular Disease  (-) Past MI and (-) DOE Normal cardiovascular exam     Neuro/Psych  Neuromuscular disease negative psych ROS   GI/Hepatic negative GI ROS, Neg liver ROS,   Endo/Other  negative endocrine ROS  Renal/GU      Musculoskeletal   Abdominal   Peds  Hematology negative hematology ROS (+)   Anesthesia Other Findings Right Lower Extremity Ischemia  Past Medical History: No date: Allergy No date: Dyspnea     Comment:  former smoker. only doe No date: History of kidney stones No date: Peripheral vascular disease [facility])  Past Surgical History: 03/14/2020: ANTERIOR CERVICAL DECOMP/DISCECTOMY FUSION; N/A     Comment:  Procedure: ANTERIOR CERVICAL DECOMPRESSION/DISCECTOMY               FUSION 3 LEVELS C4-7;  Surgeon: Meade Maw, MD;                Location: [facility] ORS;  Service: Neurosurgery;  Laterality:               N/A; 2011: BACK SURGERY 09/10/2016: CENTRAL LINE INSERTION; Right     Comment:  Procedure: CENTRAL LINE INSERTION;  Surgeon: Katha Cabal, MD;  Location: [facility] ORS;  Service: Vascular;                Laterality: Right; No date: CERVICAL FUSION     Comment:  C4-c7 on Mar 14, 2020 2013 ?: COLONOSCOPY 09/10/2016: FEMORAL-POPLITEAL BYPASS GRAFT; Right     Comment:  Procedure: BYPASS GRAFT FEMORAL-POPLITEAL ARTERY ( BELOW              KNEE );   Surgeon: Katha Cabal, MD;  Location: [facility]              ORS;  Service: Vascular;  Laterality: Right; 11/18/2016: LOWER EXTREMITY ANGIOGRAPHY; Right     Comment:  Procedure: Lower Extremity Angiography;  Surgeon:               Katha Cabal, MD;  Location: [place] [facility];                Service: Cardiovascular;  Laterality: Right; 12/09/2016: LOWER EXTREMITY ANGIOGRAPHY; Right     Comment:  Procedure: Lower Extremity Angiography;  Surgeon:               Katha Cabal, MD;  Location: [place] [facility];               Service: Cardiovascular;  Laterality: Right; 11/15/2019: LOWER EXTREMITY ANGIOGRAPHY; Right     Comment:  Procedure: LOWER EXTREMITY ANGIOGRAPHY;  Surgeon:               Katha Cabal, MD;  Location: [place] [facility];               Service: Cardiovascular;  Laterality:  Right; 12/01/2019: LOWER EXTREMITY ANGIOGRAPHY; Right     Comment:  Procedure: Lower Extremity Angiography;  Surgeon: Algernon Huxley, MD;  Location: [place] [facility];  Service:               Cardiovascular;  Laterality: Right; 12/02/2019: LOWER EXTREMITY ANGIOGRAPHY; Right     Comment:  Procedure: LOWER EXTREMITY ANGIOGRAPHY;  Surgeon:               Katha Cabal, MD;  Location: [place] [facility];               Service: Cardiovascular;  Laterality: Right; 11/18/2016: LOWER EXTREMITY INTERVENTION     Comment:  Procedure: Lower Extremity Intervention;  Surgeon:               Katha Cabal, MD;  Location: [place] [facility];                Service: Cardiovascular;; 01/02/2015: PERIPHERAL VASCULAR CATHETERIZATION; Right     Comment:  Procedure: Lower Extremity Angiography;  Surgeon:               Katha Cabal, MD;  Location: [place] [facility];                Service: Cardiovascular;  Laterality: Right; 06/18/2015: PERIPHERAL VASCULAR CATHETERIZATION; Right     Comment:  Procedure: Lower Extremity Angiography;  Surgeon: Algernon Huxley, MD;   Location: [place] [facility];  Service:               Cardiovascular;  Laterality: Right; 06/18/2015: PERIPHERAL VASCULAR CATHETERIZATION     Comment:  Procedure: Lower Extremity Intervention;  Surgeon: Algernon Huxley, MD;  Location: [place] [facility];  Service:               Cardiovascular;; 10/09/2015: PERIPHERAL VASCULAR CATHETERIZATION; N/A     Comment:  Procedure: Abdominal Aortogram w/Lower Extremity;                Surgeon: Katha Cabal, MD;  Location: [place]               CV LAB;  Service: Cardiovascular;  Laterality: N/A; 10/09/2015: PERIPHERAL VASCULAR CATHETERIZATION     Comment:  Procedure: Lower Extremity Intervention;  Surgeon:               Katha Cabal, MD;  Location: [place] [facility];                Service: Cardiovascular;; 10/10/2015: PERIPHERAL VASCULAR CATHETERIZATION; Right     Comment:  Procedure: Lower Extremity Angiography;  Surgeon: Algernon Huxley, MD;  Location: [place] [facility];  Service:               Cardiovascular;  Laterality: Right; 10/10/2015: PERIPHERAL VASCULAR CATHETERIZATION; Right     Comment:  Procedure: Lower Extremity Intervention;  Surgeon: Algernon Huxley, MD;  Location: [place] [facility];  Service:               Cardiovascular;  Laterality: Right; 12/03/2015: PERIPHERAL  VASCULAR CATHETERIZATION; Right     Comment:  Procedure: Lower Extremity Angiography;  Surgeon: Algernon Huxley, MD;  Location: [place] [facility];  Service:               Cardiovascular;  Laterality: Right; 12/04/2015: PERIPHERAL VASCULAR CATHETERIZATION; Right     Comment:  Procedure: Lower Extremity Angiography;  Surgeon: Algernon Huxley, MD;  Location: [place] [facility];  Service:               Cardiovascular;  Laterality: Right; 12/04/2015: PERIPHERAL VASCULAR CATHETERIZATION     Comment:  Procedure: Lower Extremity Intervention;  Surgeon: Algernon Huxley, MD;  Location: [place] [facility];  Service:               Cardiovascular;; 06/30/2016: PERIPHERAL VASCULAR CATHETERIZATION; Right     Comment:  Procedure: Lower Extremity Angiography;  Surgeon: Algernon Huxley, MD;  Location: [place] [facility];  Service:               Cardiovascular;  Laterality: Right; 06/25/2016: PERIPHERAL VASCULAR CATHETERIZATION; Right     Comment:  Procedure: Lower Extremity Angiography;  Surgeon:               Katha Cabal, MD;  Location: [place] [facility];                Service: Cardiovascular;  Laterality: Right; 07/01/2016: PERIPHERAL VASCULAR CATHETERIZATION; Right     Comment:  Procedure: Lower Extremity Angiography;  Surgeon:               Katha Cabal, MD;  Location: [place] [facility];                Service: Cardiovascular;  Laterality: Right; 07/01/2016: PERIPHERAL VASCULAR CATHETERIZATION     Comment:  Procedure: Lower Extremity Intervention;  Surgeon:               Katha Cabal, MD;  Location: [place] [facility];                Service: Cardiovascular;; 08/01/2016: PERIPHERAL VASCULAR CATHETERIZATION; Right     Comment:  Procedure: Lower Extremity Angiography;  Surgeon:               Katha Cabal, MD;  Location: [place] [facility];                Service: Cardiovascular;  Laterality: Right; No date: stent placement in right leg; Right  BMI    Body Mass Index: 28.07 kg/m      Reproductive/Obstetrics negative OB ROS                            Anesthesia Physical Anesthesia Plan  ASA: V and emergent  Anesthesia Plan: General ETT   Post-op Pain Management:    Induction: Intravenous  PONV Risk Score and Plan: Ondansetron, Dexamethasone, Midazolam and Treatment may vary due to age or medical condition  Airway Management Planned: Oral ETT and Video Laryngoscope Planned  Additional Equipment:   Intra-op Plan:   Post-operative Plan: Extubation in OR  Informed Consent: I have reviewed the  patients History and Physical, chart, labs and discussed the procedure including the risks, benefits and alternatives for the proposed anesthesia with the patient or authorized representative who has indicated his/her understanding and acceptance.     Dental Advisory Given  Plan Discussed with: Anesthesiologist, CRNA and Surgeon  Anesthesia Plan Comments: (Patient consented for risks of anesthesia including but not limited to:  - adverse reactions to medications - damage to eyes, teeth, lips or other oral mucosa - nerve damage due to positioning  - sore throat or hoarseness - Damage to heart, brain, nerves, lungs, other parts of body or loss of life  Patient voiced understanding.)        Anesthesia Quick Evaluation

## 2020-03-23 NOTE — Interval H&P Note (Signed)
History and Physical Interval Note:  03/23/2020 11:18 AM  Joshua [name]  has presented today for surgery, with the diagnosis of RT Lower Extremity Angiography   Ischemic leg   Pt to have Covid test today   BARD Rep.  The various methods of treatment have been discussed with the patient and family. After consideration of risks, benefits and other options for treatment, the patient has consented to  Procedure(s): Lower Extremity Angiography (Right) as a surgical intervention.  The patient's history has been reviewed, patient examined, no change in status, stable for surgery.  I have reviewed the patient's chart and labs.  Questions were answered to the patient's satisfaction.     [name]

## 2020-03-23 NOTE — Anesthesia Postprocedure Evaluation (Signed)
Anesthesia Post Note  Patient: Joshua [name]  Procedure(s) Performed: BYPASS GRAFT RIGHT FEMORAL- DISTAL TIBIAL ARTERY WITH DISTAL FLOW GRAFT (Right )  Patient location during evaluation: PACU Anesthesia Type: General Level of consciousness: awake and alert Pain management: pain level controlled Vital Signs Assessment: post-procedure vital signs reviewed and stable Respiratory status: spontaneous breathing, nonlabored ventilation, respiratory function stable and patient connected to nasal cannula oxygen Cardiovascular status: blood pressure returned to baseline and stable Postop Assessment: no apparent nausea or vomiting Anesthetic complications: no   No complications documented.   Last Vitals:  Vitals:   03/23/20 2112 03/23/20 2127  BP: 139/70 128/66  Pulse: 86 90  Resp: 16 15  Temp:    SpO2: 100% 100%    Last Pain:  Vitals:   03/23/20 2127  TempSrc:   PainSc: 0-No pain                 Precious Haws Wendee Hata

## 2020-03-23 NOTE — Care Management (Signed)
I did speak with Ms. Joshua [name] his guardian by phone and related that his surgery has gone well and that he is in the recovery room and we will continue to keep a close eye on his leg that these distal bypasses are always tenuous but I am hopeful that this will save his leg.

## 2020-03-24 ENCOUNTER — Inpatient Hospital Stay: Payer: Medicare Other | Admitting: Anesthesiology

## 2020-03-24 ENCOUNTER — Encounter
Admission: RE | Disposition: A | Discharge status: Home / Self Care | Payer: Self-pay | Source: Home / Self Care | Attending: Vascular Surgery

## 2020-03-24 ENCOUNTER — Encounter: Payer: Self-pay | Admitting: Vascular Surgery

## 2020-03-24 LAB — BASIC METABOLIC PANEL
Anion gap: 9 (ref 5–15)
BUN: 17 mg/dL (ref 8–23)
CO2: 22 mmol/L (ref 22–32)
Calcium: 7.9 mg/dL — ABNORMAL LOW (ref 8.9–10.3)
Chloride: 106 mmol/L (ref 98–111)
Creatinine, Ser: 0.86 mg/dL (ref 0.61–1.24)
GFR calc Af Amer: >60 mL/min (ref >60)
GFR calc non Af Amer: >60 mL/min (ref >60)
Glucose, Bld: 121 mg/dL — ABNORMAL HIGH (ref 70–99)
Potassium: 4.1 mmol/L (ref 3.5–5.1)
Sodium: 137 mmol/L (ref 135–145)

## 2020-03-24 LAB — PREPARE RBC (CROSSMATCH)

## 2020-03-24 LAB — CBC
HCT: 34.3 % — ABNORMAL LOW (ref 39.0–52.0)
Hemoglobin: 10.9 g/dL — ABNORMAL LOW (ref 13.0–17.0)
MCH: 26 pg (ref 26.0–34.0)
MCHC: 31.8 g/dL (ref 30.0–36.0)
MCV: 81.7 fL (ref 80.0–100.0)
Platelets: 208 10*3/uL (ref 150–400)
RBC: 4.2 MIL/uL — ABNORMAL LOW (ref 4.22–5.81)
RDW: 14.7 % (ref 11.5–15.5)
WBC: 12.3 10*3/uL — ABNORMAL HIGH (ref 4.0–10.5)
nRBC: 0 % (ref 0.0–0.2)

## 2020-03-24 LAB — MAGNESIUM: Magnesium: 2.2 mg/dL (ref 1.7–2.4)

## 2020-03-24 LAB — PHOSPHORUS: Phosphorus: 2.6 mg/dL (ref 2.5–4.6)

## 2020-03-24 SURGERY — THROMBECTOMY OF GREAT VESSEL
Anesthesia: General | Laterality: Right

## 2020-03-24 MED ORDER — EPHEDRINE SULFATE 50 MG/ML IJ SOLN
INTRAMUSCULAR | Status: DC | PRN
  Administered 2020-03-24: 10 mg via INTRAVENOUS
  Administered 2020-03-24: 5 mg via INTRAVENOUS

## 2020-03-24 MED ORDER — HEPARIN SODIUM (PORCINE) 1000 UNIT/ML IJ SOLN
INTRAMUSCULAR | Status: AC
  Filled 2020-03-24: qty 1

## 2020-03-24 MED ORDER — SODIUM CHLORIDE 0.9 % IV SOLN: INTRAVENOUS | Status: DC | PRN

## 2020-03-24 MED ORDER — PHENOL 1.4 % MT LIQD
1.0000 | OROMUCOSAL | Status: DC | PRN
  Filled 2020-03-24: qty 177

## 2020-03-24 MED ORDER — PROPOFOL 500 MG/50ML IV EMUL
INTRAVENOUS | Status: AC
  Filled 2020-03-24: qty 50

## 2020-03-24 MED ORDER — PANTOPRAZOLE SODIUM 40 MG PO TBEC
40.0000 mg | DELAYED_RELEASE_TABLET | Freq: Every day | ORAL | Status: DC
  Administered 2020-03-24 – 2020-03-31 (×8): 40 mg via ORAL
  Filled 2020-03-24 (×8): qty 1

## 2020-03-24 MED ORDER — PROPOFOL 10 MG/ML IV BOLUS
INTRAVENOUS | Status: AC
  Filled 2020-03-24: qty 20

## 2020-03-24 MED ORDER — SODIUM CHLORIDE 0.9 % IV SOLN
INTRAVENOUS | Status: DC | PRN
  Administered 2020-03-24: 50 mL via INTRAMUSCULAR

## 2020-03-24 MED ORDER — ONDANSETRON HCL 4 MG/2ML IJ SOLN
INTRAMUSCULAR | Status: DC | PRN
  Administered 2020-03-24: 4 mg via INTRAVENOUS

## 2020-03-24 MED ORDER — LIDOCAINE HCL (CARDIAC) PF 100 MG/5ML IV SOSY
PREFILLED_SYRINGE | INTRAVENOUS | Status: DC | PRN
  Administered 2020-03-24: 100 mg via INTRAVENOUS

## 2020-03-24 MED ORDER — MAGNESIUM SULFATE 2 GM/50ML IV SOLN: 2.0000 g | Freq: Every day | INTRAVENOUS | Status: DC | PRN

## 2020-03-24 MED ORDER — ALUM & MAG HYDROXIDE-SIMETH 200-200-20 MG/5ML PO SUSP: 15.0000 mL | ORAL | Status: DC | PRN

## 2020-03-24 MED ORDER — DEXMEDETOMIDINE HCL IN NACL 200 MCG/50ML IV SOLN
INTRAVENOUS | Status: DC | PRN
  Administered 2020-03-24: 8 ug via INTRAVENOUS
  Administered 2020-03-24: 4 ug via INTRAVENOUS

## 2020-03-24 MED ORDER — MIDAZOLAM HCL 2 MG/2ML IJ SOLN
INTRAMUSCULAR | Status: DC | PRN
  Administered 2020-03-24: 2 mg via INTRAVENOUS

## 2020-03-24 MED ORDER — POTASSIUM CHLORIDE CRYS ER 20 MEQ PO TBCR: 20.0000 meq | EXTENDED_RELEASE_TABLET | Freq: Every day | ORAL | Status: DC | PRN

## 2020-03-24 MED ORDER — HEPARIN SODIUM (PORCINE) 1000 UNIT/ML IJ SOLN
INTRAMUSCULAR | Status: DC | PRN
  Administered 2020-03-24: 2000 [IU] via INTRAVENOUS
  Administered 2020-03-24: 8000 [IU] via INTRAVENOUS

## 2020-03-24 MED ORDER — ACETAMINOPHEN 325 MG PO TABS
325.0000 mg | ORAL_TABLET | ORAL | Status: DC | PRN
  Administered 2020-03-25 (×2): 650 mg via ORAL
  Filled 2020-03-24: qty 2

## 2020-03-24 MED ORDER — METOPROLOL TARTRATE 5 MG/5ML IV SOLN: 2.0000 mg | INTRAVENOUS | Status: DC | PRN

## 2020-03-24 MED ORDER — LIDOCAINE HCL (PF) 1 % IJ SOLN
INTRAMUSCULAR | Status: AC
  Filled 2020-03-24: qty 30

## 2020-03-24 MED ORDER — SODIUM CHLORIDE 0.9 % IV SOLN: 500.0000 mL | Freq: Once | INTRAVENOUS | Status: DC | PRN

## 2020-03-24 MED ORDER — PHENYLEPHRINE HCL (PRESSORS) 10 MG/ML IV SOLN
INTRAVENOUS | Status: DC | PRN
  Administered 2020-03-24: 200 ug via INTRAVENOUS
  Administered 2020-03-24: 100 ug via INTRAVENOUS

## 2020-03-24 MED ORDER — MIDAZOLAM HCL 2 MG/2ML IJ SOLN
INTRAMUSCULAR | Status: AC
  Filled 2020-03-24: qty 2

## 2020-03-24 MED ORDER — DIPHENHYDRAMINE HCL 50 MG/ML IJ SOLN
INTRAMUSCULAR | Status: AC
  Filled 2020-03-24: qty 1

## 2020-03-24 MED ORDER — GUAIFENESIN-DM 100-10 MG/5ML PO SYRP: 15.0000 mL | ORAL_SOLUTION | ORAL | Status: DC | PRN

## 2020-03-24 MED ORDER — ROCURONIUM BROMIDE 100 MG/10ML IV SOLN
INTRAVENOUS | Status: DC | PRN
  Administered 2020-03-24: 20 mg via INTRAVENOUS
  Administered 2020-03-24: 10 mg via INTRAVENOUS
  Administered 2020-03-24: 30 mg via INTRAVENOUS

## 2020-03-24 MED ORDER — SODIUM CHLORIDE 0.9 % IV SOLN
INTRAVENOUS | Status: DC | PRN
  Administered 2020-03-24: 25 ug/min via INTRAVENOUS

## 2020-03-24 MED ORDER — SUCCINYLCHOLINE CHLORIDE 20 MG/ML IJ SOLN
INTRAMUSCULAR | Status: DC | PRN
  Administered 2020-03-24: 120 mg via INTRAVENOUS

## 2020-03-24 MED ORDER — HYDRALAZINE HCL 20 MG/ML IJ SOLN: 5.0000 mg | INTRAMUSCULAR | Status: DC | PRN

## 2020-03-24 MED ORDER — SUGAMMADEX SODIUM 500 MG/5ML IV SOLN
INTRAVENOUS | Status: DC | PRN
  Administered 2020-03-24: 400 mg via INTRAVENOUS

## 2020-03-24 MED ORDER — GLYCOPYRROLATE 0.2 MG/ML IJ SOLN
INTRAMUSCULAR | Status: DC | PRN
  Administered 2020-03-24: .2 mg via INTRAVENOUS

## 2020-03-24 MED ORDER — CEFAZOLIN SODIUM-DEXTROSE 2-4 GM/100ML-% IV SOLN
2.0000 g | Freq: Three times a day (TID) | INTRAVENOUS | Status: AC
  Administered 2020-03-24 – 2020-03-25 (×2): 2 g via INTRAVENOUS
  Filled 2020-03-24 (×2): qty 100

## 2020-03-24 MED ORDER — HEPARIN SODIUM (PORCINE) 5000 UNIT/ML IJ SOLN
INTRAMUSCULAR | Status: AC
  Filled 2020-03-24: qty 1

## 2020-03-24 MED ORDER — SODIUM CHLORIDE 0.9 % IV SOLN: INTRAVENOUS | Status: DC

## 2020-03-24 MED ORDER — ROCURONIUM BROMIDE 10 MG/ML (PF) SYRINGE
PREFILLED_SYRINGE | INTRAVENOUS | Status: AC
  Filled 2020-03-24: qty 10

## 2020-03-24 MED ORDER — HEPARIN (PORCINE) 25000 UT/250ML-% IV SOLN
1400.0000 [IU]/h | INTRAVENOUS | Status: DC
  Administered 2020-03-24 – 2020-03-27 (×5): 1300 [IU]/h via INTRAVENOUS
  Filled 2020-03-24 (×3): qty 250

## 2020-03-24 MED ORDER — CLOPIDOGREL BISULFATE 75 MG PO TABS
75.0000 mg | ORAL_TABLET | Freq: Every day | ORAL | Status: DC
  Administered 2020-03-25 – 2020-03-31 (×7): 75 mg via ORAL
  Filled 2020-03-24 (×7): qty 1

## 2020-03-24 MED ORDER — DEXAMETHASONE SODIUM PHOSPHATE 10 MG/ML IJ SOLN
INTRAMUSCULAR | Status: DC | PRN
  Administered 2020-03-24: 10 mg via INTRAVENOUS

## 2020-03-24 MED ORDER — ONDANSETRON HCL 4 MG/2ML IJ SOLN: 4.0000 mg | Freq: Four times a day (QID) | INTRAMUSCULAR | Status: DC | PRN

## 2020-03-24 MED ORDER — FENTANYL CITRATE (PF) 100 MCG/2ML IJ SOLN
INTRAMUSCULAR | Status: DC | PRN
Start: 2020-03-24 — End: 2020-03-24
  Administered 2020-03-24 (×3): 50 ug via INTRAVENOUS

## 2020-03-24 MED ORDER — CEFAZOLIN SODIUM-DEXTROSE 1-4 GM/50ML-% IV SOLN
INTRAVENOUS | Status: DC | PRN
  Administered 2020-03-24: 2 g via INTRAVENOUS

## 2020-03-24 MED ORDER — CLOPIDOGREL BISULFATE 75 MG PO TABS
75.0000 mg | ORAL_TABLET | Freq: Once | ORAL | Status: AC
  Administered 2020-03-24: 75 mg via ORAL
  Filled 2020-03-24: qty 1

## 2020-03-24 MED ORDER — ACETAMINOPHEN 650 MG RE SUPP: 325.0000 mg | RECTAL | Status: DC | PRN

## 2020-03-24 MED ORDER — PROPOFOL 10 MG/ML IV BOLUS
INTRAVENOUS | Status: DC | PRN
  Administered 2020-03-24: 160 mg via INTRAVENOUS

## 2020-03-24 MED ORDER — LABETALOL HCL 5 MG/ML IV SOLN: 10.0000 mg | INTRAVENOUS | Status: DC | PRN

## 2020-03-24 MED ORDER — CHLORHEXIDINE GLUCONATE CLOTH 2 % EX PADS
6.0000 | MEDICATED_PAD | Freq: Every day | CUTANEOUS | Status: DC
  Administered 2020-03-24 – 2020-03-31 (×6): 6 via TOPICAL

## 2020-03-24 MED ORDER — DOCUSATE SODIUM 100 MG PO CAPS
100.0000 mg | ORAL_CAPSULE | Freq: Every day | ORAL | Status: DC
  Administered 2020-03-26 – 2020-03-27 (×2): 100 mg via ORAL
  Filled 2020-03-24: qty 1

## 2020-03-24 MED ORDER — ASPIRIN EC 81 MG PO TBEC: 81.0000 mg | DELAYED_RELEASE_TABLET | Freq: Every day | ORAL | Status: DC

## 2020-03-24 SURGICAL SUPPLY — 72 items
APPLIER CLIP 13 LRG OPEN (CLIP)
APPLIER CLIP 9.375 SM OPEN (CLIP)
BAG DECANTER FOR FLEXI CONT (MISCELLANEOUS) ×2 IMPLANT
BAG DECANTER STRL (MISCELLANEOUS) ×2 IMPLANT
BAG ISOLATATION DRAPE 20X20 ST (DRAPES) ×1 IMPLANT
BLADE SURG 15 STRL LF DISP TIS (BLADE) ×1 IMPLANT
BLADE SURG 15 STRL SS (BLADE) ×1
BLADE SURG SZ11 CARB STEEL (BLADE) ×2 IMPLANT
BOOT SUTURE AID YELLOW STND (SUTURE) ×2 IMPLANT
BRUSH SCRUB EZ  4% CHG (MISCELLANEOUS) ×1
BRUSH SCRUB EZ 4% CHG (MISCELLANEOUS) ×1 IMPLANT
CANISTER SUCT 1200ML W/VALVE (MISCELLANEOUS) ×2 IMPLANT
CANISTER SUCT 3000ML PPV (MISCELLANEOUS) ×2 IMPLANT
CATH EMBOLECTOMY 3X80 (CATHETERS) ×2 IMPLANT
CATH FOGERTY 4X80 WAS (CATHETERS) ×2 IMPLANT
CHLORAPREP W/TINT 26 (MISCELLANEOUS) ×2 IMPLANT
CLIP APPLIE 13 LRG OPEN (CLIP) IMPLANT
CLIP APPLIE 9.375 SM OPEN (CLIP) IMPLANT
COVER BACK TABLE 60X90IN (DRAPES) IMPLANT
COVER WAND RF STERILE (DRAPES) ×2 IMPLANT
DERMABOND ADVANCED (GAUZE/BANDAGES/DRESSINGS) ×1
DERMABOND ADVANCED .7 DNX12 (GAUZE/BANDAGES/DRESSINGS) ×1 IMPLANT
DRAPE FEMORAL ANGIO 80X135IN (DRAPES) IMPLANT
DRAPE INCISE IOBAN 66X45 STRL (DRAPES) ×2 IMPLANT
DRAPE ISOLATE BAG 20X20 STRL (DRAPES) ×1
DRSG OPSITE POSTOP 4X10 (GAUZE/BANDAGES/DRESSINGS) ×2 IMPLANT
DRSG OPSITE POSTOP 4X6 (GAUZE/BANDAGES/DRESSINGS) ×2 IMPLANT
DRSG OPSITE POSTOP 4X8 (GAUZE/BANDAGES/DRESSINGS) ×2 IMPLANT
DURAPREP 26ML APPLICATOR (WOUND CARE) ×4 IMPLANT
ELECT REM PT RETURN 9FT ADLT (ELECTROSURGICAL) ×2
ELECTRODE REM PT RTRN 9FT ADLT (ELECTROSURGICAL) ×1 IMPLANT
GAUZE 4X4 16PLY RFD (DISPOSABLE) ×2 IMPLANT
GEL ULTRASOUND 20GR AQUASONIC (MISCELLANEOUS) ×2 IMPLANT
GLOVE BIO SURGEON STRL SZ7 (GLOVE) ×4 IMPLANT
GLOVE BIOGEL PI IND STRL 7.5 (GLOVE) ×1 IMPLANT
GLOVE BIOGEL PI INDICATOR 7.5 (GLOVE) ×1
GLOVE INDICATOR 7.5 STRL GRN (GLOVE) ×2 IMPLANT
GLOVE SURG SYN 7.5  E (GLOVE) ×1
GLOVE SURG SYN 7.5 E (GLOVE) ×1 IMPLANT
GOWN STRL REUS W/ TWL LRG LVL3 (GOWN DISPOSABLE) ×4 IMPLANT
GOWN STRL REUS W/ TWL XL LVL3 (GOWN DISPOSABLE) ×2 IMPLANT
GOWN STRL REUS W/TWL LRG LVL3 (GOWN DISPOSABLE) ×4
GOWN STRL REUS W/TWL XL LVL3 (GOWN DISPOSABLE) ×2
HEMOSTAT ARISTA ABSORB 3G PWDR (HEMOSTASIS) ×2 IMPLANT
IV NS 500ML (IV SOLUTION) ×1
IV NS 500ML BAXH (IV SOLUTION) ×1 IMPLANT
KIT BASIN OR (CUSTOM PROCEDURE TRAY) ×2 IMPLANT
KIT TURNOVER KIT A (KITS) ×2 IMPLANT
LABEL OR SOLS (LABEL) ×2 IMPLANT
LOOP RED MAXI  1X406MM (MISCELLANEOUS) ×1
LOOP VESSEL MAXI 1X406 RED (MISCELLANEOUS) ×1 IMPLANT
LOOP VESSEL MINI 0.8X406 BLUE (MISCELLANEOUS) ×2 IMPLANT
LOOPS BLUE MINI 0.8X406MM (MISCELLANEOUS) ×2
NEEDLE HYPO 25GX1X1/2 BEV (NEEDLE) IMPLANT
NS IRRIG 1000ML POUR BTL (IV SOLUTION) IMPLANT
NS IRRIG 500ML POUR BTL (IV SOLUTION) ×2 IMPLANT
PACK BASIN MAJOR ARMC (MISCELLANEOUS) ×2 IMPLANT
PACK EXTREMITY (MISCELLANEOUS) ×2 IMPLANT
PACK UNIVERSAL (MISCELLANEOUS) ×2 IMPLANT
PAD ARMBOARD 7.5X6 YLW CONV (MISCELLANEOUS) ×4 IMPLANT
SPONGE LAP 18X18 RF (DISPOSABLE) ×2 IMPLANT
STAPLER SKIN PROX 35W (STAPLE) ×2 IMPLANT
SUT PROLENE 6 0 BV (SUTURE) ×2 IMPLANT
SUT VIC AB 2-0 CT1 36 (SUTURE) ×2 IMPLANT
SYR 10ML LL (SYRINGE) ×8 IMPLANT
SYR 20ML LL LF (SYRINGE) ×2 IMPLANT
SYR 30ML LL (SYRINGE) ×2 IMPLANT
SYR 3ML LL SCALE MARK (SYRINGE) ×4 IMPLANT
SYR 5ML LL (SYRINGE) ×2 IMPLANT
SYR CONTROL 10ML LL (SYRINGE) ×2 IMPLANT
TOWEL OR 17X26 4PK STRL BLUE (TOWEL DISPOSABLE) ×8 IMPLANT
TRAY FOLEY MTR SLVR 16FR STAT (SET/KITS/TRAYS/PACK) ×2 IMPLANT

## 2020-03-24 NOTE — Transfer of Care (Signed)
Immediate Anesthesia Transfer of Care Note  Patient: [name]  Procedure(s) Performed: THROMBECTOMY OF GREAT VESSEL (Right )  Patient Location: ICU  Anesthesia Type:General  Level of Consciousness: awake, alert , oriented and patient cooperative  Airway & Oxygen Therapy: Patient Spontanous Breathing and Patient connected to face mask oxygen  Post-op Assessment: Report given to RN, Post -op Vital signs reviewed and stable and Patient moving all extremities X 4  Post vital signs: Reviewed and stable  Last Vitals:  Vitals Value Taken Time  BP    Temp    Pulse 92 03/24/20 1139  Resp 13 03/24/20 1139  SpO2 99 % 03/24/20 1139  Vitals shown include unvalidated device data.  Last Pain:  Vitals:   03/24/20 0800  TempSrc: Oral  PainSc:       Patients Stated Pain Goal: 2 (68/11/57 2620)  Complications: No complications documented.

## 2020-03-24 NOTE — Progress Notes (Signed)
    Subjective  - POD #1, s/p right fem-PT bypass  Lost signal in PT last night but leg was warm.  The color in his leg  changed overnight and it was cool to the touch.  He says his leg feels heavy but does not hurt  Started on heparin last night  Physical Exam:  No PT doppler signal       Assessment/Plan:  POD #1  Occluded BPG.  I discussed going back to the OR for thrombectomy as one last attempt at limb salvage.  He wishes to proceed  Wells Marie Chow 03/24/2020 9:07 AM --  Vitals:   03/24/20 0700 03/24/20 0800  BP: 129/63 116/62  Pulse: 67 64  Resp: (!) 9 (!) 0  Temp:  97.7 F (36.5 C)  SpO2: 97% 97%    Intake/Output Summary (Last 24 hours) at 03/24/2020 0907 Last data filed at 03/24/2020 0800 Gross per 24 hour  Intake 2638.76 ml  Output 1320 ml  Net 1318.76 ml     Laboratory CBC    Component Value Date/Time   WBC 12.1 (H) 03/23/2020 2017   HGB 12.2 (L) 03/23/2020 2017   HGB 13.3 04/13/2019 1002   HCT 36.6 (L) 03/23/2020 2017   HCT 39.8 04/13/2019 1002   PLT 233 03/23/2020 2017   PLT 346 04/13/2019 1002    BMET    Component Value Date/Time   NA 138 03/23/2020 1522   NA 141 10/14/2019 1101   K 4.3 03/23/2020 1522   CL 100 03/23/2020 1522   CO2 27 03/23/2020 1522   GLUCOSE 92 03/23/2020 1522   BUN 18 03/23/2020 1522   BUN 15 10/14/2019 1101   BUN 16 07/03/2014 1205   CREATININE 0.87 03/23/2020 1522   CREATININE 1.34 (H) 07/03/2014 1205   CALCIUM 8.6 (L) 03/23/2020 1522   GFRNONAA >60 03/23/2020 1522   GFRNONAA 59 (L) 07/03/2014 1205   GFRAA >60 03/23/2020 1522   GFRAA >60 07/03/2014 1205    COAG Lab Results  Component Value Date   INR 1.0 03/05/2020   INR 1.11 06/25/2016   INR 1.90 05/12/2016   No results found for: PTT  Antibiotics Anti-infectives (From admission, onward)   Start     Dose/Rate Route Frequency Ordered Stop   03/24/20 0600  ceFAZolin (ANCEF) IVPB 2g/100 mL premix  Status:  Discontinued        2 g 200 mL/hr over 30  Minutes Intravenous On call to O.R. 03/23/20 2042 03/24/20 0018   03/24/20 0100  ceFAZolin (ANCEF) IVPB 1 g/50 mL premix        1 g 100 mL/hr over 30 Minutes Intravenous Every 8 hours 03/23/20 2119 03/24/20 1659   03/23/20 1015  ceFAZolin (ANCEF) IVPB 2g/100 mL premix        2 g 200 mL/hr over 30 Minutes Intravenous  Once 03/23/20 1002 03/23/20 1144       V. Leia Alf, M.D., [facility] Office: [phone] Pager:  [phone]

## 2020-03-24 NOTE — Progress Notes (Signed)
OT Cancellation Note  Patient Details Name: Joshua [name] MRN: [id number] DOB: Jan 31, 1956   Cancelled Treatment:    Reason Eval/Treat Not Completed: Patient not medically ready. OT consult received and chart reviewed.  Per [facility], pt scheduled for return to OR this AM due to continued vascular concerns.  Will re-attempt at later time/date as medically appropriate and available.  Dessie Coma, M.S. OTR/L  03/24/20, 8:54 AM  ascom [phone]

## 2020-03-24 NOTE — Progress Notes (Signed)
PT Cancellation Note  Patient Details Name: Joshua [name] MRN: [id number] DOB: March 17, 1956   Cancelled Treatment:    Reason Eval/Treat Not Completed: Medical issues which prohibited therapy (Consult received and chart reviewed.  Per notes, patient scheduled for return to OR this AM due to continued vascular concerns.  Will re-attempt at later time/date as medically appropriate and available.)   Giulia Hickey H. Owens Shark, PT, DPT, NCS 03/24/20, 8:43 AM [phone]

## 2020-03-24 NOTE — Op Note (Signed)
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  I was able to pass a #3 Fogarty catheter all the way across the ankle and remove fresh thrombus and establish adequate backbleeding as well as excellent inflow from the bypass graft.  There was no obvious reason for bypass graft occlusion.  Indications: The patient has undergone multiple previous surgical and percutaneous revascularization attempts on his right leg.  Most recently, last night he underwent a right femoral to posterior tibial artery bypass graft with distal flow graft, his bypass graft occluded and he had ischemic changes to his leg this morning and so I discussed one final attempt at revascularization of the right leg.  He discussed this with his aunt and wished to proceed.  Procedure:  The patient was identified in the holding area and taken to [place] [facility]  The patient was then placed supine on the table. general anesthesia was administered.  The patient was prepped and draped in the usual sterile fashion.  A time out was called and antibiotics were administered.  The patient's previous below-knee incision was opened by removing the staples and previous suture closure.  The graft was easily dissected free and encircled with a vessel loop as was the posterior tibial artery proximal and distal to the anastomosis.  The patient was fully heparinized.  I used a 11 blade to make a longitudinal graftotomy in the hood of the graft.  Fresh thrombus was encountered.  I 1st passed a #4 Fogarty catheter  proximally which easily went across the proximal anastomosis.  Thrombectomy was performed evacuating acute clot.  There was excellent inflow.  I repeated the thrombectomy and no further thrombus was evacuated.  The graft was flushed with heparin saline and reoccluded.  I then selected a #3 Fogarty and passed this proximally in the posterior tibial artery.  It would only go about 5 cm.  Fresh thrombus was evacuated.  Multiple passes were performed.  I then passed the #3 Fogarty catheter distally down across the ankle and evacuated acute clot and established good backbleeding.  A 2nd negative pass was performed.  The artery was then flushed with heparin saline.  I closed the graftotomy primarily with a running 6-0 Prolene.  Blood flow was then reestablished to the foot.  There was a triphasic posterior tibial Doppler signal.  The wound was then irrigated.  Hemostasis was achieved.  Arista was used to assist with hemostasis.  The tissues were then reapproximated with 2-0 Vicryl and the skin was closed with staples.  There were no immediate complications.   Disposition: To PACU stable.   Theotis Burrow, M.D., [facility] [place] Office: [phone] Pager:  [phone]

## 2020-03-24 NOTE — Progress Notes (Signed)
CHG bath completed. Pt kept NPO.

## 2020-03-24 NOTE — Progress Notes (Signed)
Assessed pt at 0745. Unable to doppler pulses. Foot cold to touch. Skin mottled in appearance. Paged Dr. Trula Slade at [phone]. Called back immediately. Notified Dr. Trula Slade of RLE condition. Plan is to go back to OR.

## 2020-03-24 NOTE — Anesthesia Preprocedure Evaluation (Signed)
Anesthesia Evaluation  Patient identified by MRN, date of birth, ID band Patient awake    Reviewed: Allergy & Precautions, H&P , NPO status , Patient's Chart, lab work & pertinent test results  History of Anesthesia Complications Negative for: history of anesthetic complications  Airway Mallampati: III  TM Distance: >3 FB Neck ROM: limited    Dental  (+) Chipped, Poor Dentition   Pulmonary shortness of breath and with exertion, COPD, former smoker,    Pulmonary exam normal        Cardiovascular hypertension, (-) angina+ Peripheral Vascular Disease  (-) Past MI and (-) DOE Normal cardiovascular exam     Neuro/Psych  Neuromuscular disease negative psych ROS   GI/Hepatic negative GI ROS, Neg liver ROS,   Endo/Other  negative endocrine ROS  Renal/GU      Musculoskeletal   Abdominal   Peds  Hematology negative hematology ROS (+)   Anesthesia Other Findings Right Lower Extremity Ischemia  Past Medical History: No date: Allergy No date: Dyspnea     Comment:  former smoker. only doe No date: History of kidney stones No date: Peripheral vascular disease [facility])  Past Surgical History: 03/14/2020: ANTERIOR CERVICAL DECOMP/DISCECTOMY FUSION; N/A     Comment:  Procedure: ANTERIOR CERVICAL DECOMPRESSION/DISCECTOMY               FUSION 3 LEVELS C4-7;  Surgeon: Meade Maw, MD;                Location: [facility] ORS;  Service: Neurosurgery;  Laterality:               N/A; 2011: BACK SURGERY 09/10/2016: CENTRAL LINE INSERTION; Right     Comment:  Procedure: CENTRAL LINE INSERTION;  Surgeon: Katha Cabal, MD;  Location: [facility] ORS;  Service: Vascular;                Laterality: Right; No date: CERVICAL FUSION     Comment:  C4-c7 on Mar 14, 2020 2013 ?: COLONOSCOPY 09/10/2016: FEMORAL-POPLITEAL BYPASS GRAFT; Right     Comment:  Procedure: BYPASS GRAFT FEMORAL-POPLITEAL ARTERY ( BELOW              KNEE );   Surgeon: Katha Cabal, MD;  Location: [facility]              ORS;  Service: Vascular;  Laterality: Right; 11/18/2016: LOWER EXTREMITY ANGIOGRAPHY; Right     Comment:  Procedure: Lower Extremity Angiography;  Surgeon:               Katha Cabal, MD;  Location: [place] CV LAB;                Service: Cardiovascular;  Laterality: Right; 12/09/2016: LOWER EXTREMITY ANGIOGRAPHY; Right     Comment:  Procedure: Lower Extremity Angiography;  Surgeon:               Katha Cabal, MD;  Location: [place] CV LAB;               Service: Cardiovascular;  Laterality: Right; 11/15/2019: LOWER EXTREMITY ANGIOGRAPHY; Right     Comment:  Procedure: LOWER EXTREMITY ANGIOGRAPHY;  Surgeon:               Katha Cabal, MD;  Location: [place] CV LAB;               Service: Cardiovascular;  Laterality:  Right; 12/01/2019: LOWER EXTREMITY ANGIOGRAPHY; Right     Comment:  Procedure: Lower Extremity Angiography;  Surgeon: Algernon Huxley, MD;  Location: [place] CV LAB;  Service:               Cardiovascular;  Laterality: Right; 12/02/2019: LOWER EXTREMITY ANGIOGRAPHY; Right     Comment:  Procedure: LOWER EXTREMITY ANGIOGRAPHY;  Surgeon:               Katha Cabal, MD;  Location: [place] CV LAB;               Service: Cardiovascular;  Laterality: Right; 11/18/2016: LOWER EXTREMITY INTERVENTION     Comment:  Procedure: Lower Extremity Intervention;  Surgeon:               Katha Cabal, MD;  Location: [place] CV LAB;                Service: Cardiovascular;; 01/02/2015: PERIPHERAL VASCULAR CATHETERIZATION; Right     Comment:  Procedure: Lower Extremity Angiography;  Surgeon:               Katha Cabal, MD;  Location: [place] CV LAB;                Service: Cardiovascular;  Laterality: Right; 06/18/2015: PERIPHERAL VASCULAR CATHETERIZATION; Right     Comment:  Procedure: Lower Extremity Angiography;  Surgeon: Algernon Huxley, MD;   Location: [place] CV LAB;  Service:               Cardiovascular;  Laterality: Right; 06/18/2015: PERIPHERAL VASCULAR CATHETERIZATION     Comment:  Procedure: Lower Extremity Intervention;  Surgeon: Algernon Huxley, MD;  Location: [place] CV LAB;  Service:               Cardiovascular;; 10/09/2015: PERIPHERAL VASCULAR CATHETERIZATION; N/A     Comment:  Procedure: Abdominal Aortogram w/Lower Extremity;                Surgeon: Katha Cabal, MD;  Location: [place]               CV LAB;  Service: Cardiovascular;  Laterality: N/A; 10/09/2015: PERIPHERAL VASCULAR CATHETERIZATION     Comment:  Procedure: Lower Extremity Intervention;  Surgeon:               Katha Cabal, MD;  Location: [place] CV LAB;                Service: Cardiovascular;; 10/10/2015: PERIPHERAL VASCULAR CATHETERIZATION; Right     Comment:  Procedure: Lower Extremity Angiography;  Surgeon: Algernon Huxley, MD;  Location: [place] CV LAB;  Service:               Cardiovascular;  Laterality: Right; 10/10/2015: PERIPHERAL VASCULAR CATHETERIZATION; Right     Comment:  Procedure: Lower Extremity Intervention;  Surgeon: Algernon Huxley, MD;  Location: [place] CV LAB;  Service:               Cardiovascular;  Laterality: Right; 12/03/2015: PERIPHERAL  VASCULAR CATHETERIZATION; Right     Comment:  Procedure: Lower Extremity Angiography;  Surgeon: Algernon Huxley, MD;  Location: [place] CV LAB;  Service:               Cardiovascular;  Laterality: Right; 12/04/2015: PERIPHERAL VASCULAR CATHETERIZATION; Right     Comment:  Procedure: Lower Extremity Angiography;  Surgeon: Algernon Huxley, MD;  Location: [place] CV LAB;  Service:               Cardiovascular;  Laterality: Right; 12/04/2015: PERIPHERAL VASCULAR CATHETERIZATION     Comment:  Procedure: Lower Extremity Intervention;  Surgeon: Algernon Huxley, MD;  Location: [place] CV LAB;  Service:               Cardiovascular;; 06/30/2016: PERIPHERAL VASCULAR CATHETERIZATION; Right     Comment:  Procedure: Lower Extremity Angiography;  Surgeon: Algernon Huxley, MD;  Location: [place] CV LAB;  Service:               Cardiovascular;  Laterality: Right; 06/25/2016: PERIPHERAL VASCULAR CATHETERIZATION; Right     Comment:  Procedure: Lower Extremity Angiography;  Surgeon:               Katha Cabal, MD;  Location: [place] CV LAB;                Service: Cardiovascular;  Laterality: Right; 07/01/2016: PERIPHERAL VASCULAR CATHETERIZATION; Right     Comment:  Procedure: Lower Extremity Angiography;  Surgeon:               Katha Cabal, MD;  Location: [place] CV LAB;                Service: Cardiovascular;  Laterality: Right; 07/01/2016: PERIPHERAL VASCULAR CATHETERIZATION     Comment:  Procedure: Lower Extremity Intervention;  Surgeon:               Katha Cabal, MD;  Location: [place] CV LAB;                Service: Cardiovascular;; 08/01/2016: PERIPHERAL VASCULAR CATHETERIZATION; Right     Comment:  Procedure: Lower Extremity Angiography;  Surgeon:               Katha Cabal, MD;  Location: [place] CV LAB;                Service: Cardiovascular;  Laterality: Right; No date: stent placement in right leg; Right  BMI    Body Mass Index: 28.07 kg/m      Reproductive/Obstetrics negative OB ROS                             Anesthesia Physical  Anesthesia Plan  ASA: IV and emergent  Anesthesia Plan: General ETT   Post-op Pain Management:    Induction: Intravenous  PONV Risk Score and Plan: Ondansetron, Dexamethasone, Midazolam and Treatment may vary due to age or medical condition  Airway Management Planned: Oral ETT and Video Laryngoscope Planned  Additional Equipment:   Intra-op Plan:   Post-operative Plan: Extubation in  OR  Informed Consent: I have reviewed  the patients History and Physical, chart, labs and discussed the procedure including the risks, benefits and alternatives for the proposed anesthesia with the patient or authorized representative who has indicated his/her understanding and acceptance.     Dental Advisory Given  Plan Discussed with: Anesthesiologist, CRNA and Surgeon  Anesthesia Plan Comments: (Patient consented for risks of anesthesia including but not limited to:  - adverse reactions to medications - damage to eyes, teeth, lips or other oral mucosa - nerve damage due to positioning  - sore throat or hoarseness - Damage to heart, brain, nerves, lungs, other parts of body or loss of life  Patient voiced understanding.)        Anesthesia Quick Evaluation

## 2020-03-24 NOTE — Anesthesia Procedure Notes (Addendum)
Procedure Name: Intubation Performed by: Kelton Pillar, CRNA Pre-anesthesia Checklist: Patient identified, Emergency Drugs available, Suction available and Patient being monitored Patient Re-evaluated:Patient Re-evaluated prior to induction Oxygen Delivery Method: Circle system utilized Preoxygenation: Pre-oxygenation with 100% oxygen Induction Type: IV induction Laryngoscope Size: McGraph and 4 Grade View: Grade II Tube type: Oral Tube size: 7.0 mm Number of attempts: 1 Airway Equipment and Method: Stylet and Oral airway Placement Confirmation: ETT inserted through vocal cords under direct vision,  positive ETCO2,  breath sounds checked- equal and bilateral and CO2 detector Secured at: 21 cm Tube secured with: Tape Dental Injury: Teeth and Oropharynx as per pre-operative assessment  Difficulty Due To: Difficult Airway- due to cervical collar and Difficulty was anticipated Future Recommendations: Recommend- induction with short-acting agent, and alternative techniques readily available Comments: Atraumatic, strict neutral position/cervical precautions  maintained throughout -[facility]

## 2020-03-24 NOTE — Progress Notes (Signed)
ANTICOAGULATION CONSULT NOTE - Initial Consult  Pharmacy Consult for Heparin Indication: occluded right femoral posterior tibial bypass graft, s/p thrombectomy  No Known Allergies  Patient Measurements: Height: 6' (182.9 cm) Weight: 93.9 kg (207 lb) IBW/kg (Calculated) : 77.6 HEPARIN DW (KG): 93.9  Vital Signs: Temp: 98.1 F (36.7 C) (09/04 1600) Temp Source: Oral (09/04 1600) BP: 114/59 (09/04 1900) Pulse Rate: 81 (09/04 1900)  Labs: Recent Labs    03/23/20 1022 03/23/20 1522 03/23/20 1522 03/23/20 2017 03/24/20 1232  HGB  --  11.6*   < > 12.2* 10.9*  HCT  --  37.1*  --  36.6* 34.3*  PLT  --  262  --  233 208  CREATININE 0.91 0.87  --   --  0.86   < > = values in this interval not displayed.    Estimated Creatinine Clearance: 107.3 mL/min (by C-G formula based on SCr of 0.86 mg/dL).   Medical History: Past Medical History:  Diagnosis Date  . Allergy   . Dyspnea    former smoker. only doe  . History of kidney stones   . Peripheral vascular disease (HCC)     Medications:  Scheduled:  . aspirin EC  81 mg Oral Daily  . [START ON 03/25/2020] aspirin EC  81 mg Oral Daily  . Chlorhexidine Gluconate Cloth  6 each Topical Daily  . [START ON 03/25/2020] clopidogrel  75 mg Oral Q0600  . diphenhydrAMINE      . docusate sodium  100 mg Oral Daily  . [START ON 03/25/2020] docusate sodium  100 mg Oral Daily  . gabapentin  300 mg Oral QHS  . pantoprazole  40 mg Oral Daily  . simvastatin  20 mg Oral q1800   Assessment: Pt has been on 500 units/hr of heparin with orders to NOT TITRATE.  Now asked to increase Heparin for full anticoagulation and adjust per protocol.  Goal of Therapy:  Heparin level 0.3-0.7 units/ml Monitor platelets by anticoagulation protocol: Yes   Plan:  Increase Heparin to 1300 units/hr (no bolus per MD orders) Check HL ~ 8 hours after heparin increased Monitor for s/sx bleeding complications  Hart Robinsons A 03/24/2020,10:02 PM

## 2020-03-25 LAB — TYPE AND SCREEN
ABO/RH(D): A POS
Antibody Screen: NEGATIVE
Unit division: 0
Unit division: 0

## 2020-03-25 LAB — BPAM RBC
Blood Product Expiration Date: 202110052359
Blood Product Expiration Date: 202110062359
ISSUE DATE / TIME: 202109031729
ISSUE DATE / TIME: 202109031729
Unit Type and Rh: 6200
Unit Type and Rh: 6200

## 2020-03-25 LAB — CBC
HCT: 31.5 % — ABNORMAL LOW (ref 39.0–52.0)
Hemoglobin: 10.1 g/dL — ABNORMAL LOW (ref 13.0–17.0)
MCH: 26.2 pg (ref 26.0–34.0)
MCHC: 32.1 g/dL (ref 30.0–36.0)
MCV: 81.6 fL (ref 80.0–100.0)
Platelets: 216 10*3/uL (ref 150–400)
RBC: 3.86 MIL/uL — ABNORMAL LOW (ref 4.22–5.81)
RDW: 15 % (ref 11.5–15.5)
WBC: 15.2 10*3/uL — ABNORMAL HIGH (ref 4.0–10.5)
nRBC: 0 % (ref 0.0–0.2)

## 2020-03-25 LAB — HEPARIN LEVEL (UNFRACTIONATED)
Heparin Unfractionated: 0.56 IU/mL (ref 0.30–0.70)
Heparin Unfractionated: 0.37 IU/mL (ref 0.30–0.70)

## 2020-03-25 NOTE — Progress Notes (Signed)
Physical therapy got pt up to chair and came and alerted me that pts L Leg anterior sight had new blood on honeycomb dressing. I went and assessed pt he is asymptomatic I am able to feel pulses +2 in his pedal pulses and his Legs are warm to the touch. I messaged MD who advised me to reinforce dressing with an ace wrap. I completed order and will continue to monitor.

## 2020-03-25 NOTE — Consult Note (Signed)
Referring Physician:  No referring provider defined for this encounter.  Primary Physician:  Virginia Crews, MD  Chief Complaint:  Acute limb ischemia, history of ACDF  History of Present Illness: 03/25/2020 Joshua [name] is a 64 y.o. male who presents with the chief complaint of acute limb ischemia with arterial blockage managed by vascular surgery.  He is currently doing much better after revascularization.  He has no leg pain.  He reports improvement in many of his symptoms since his neck surgery.  He currently has no complaints from that surgery.  He is swallowing well.  He has no new neck swelling.   Review of Systems:  A 10 point review of systems is negative, except for the pertinent positives and negatives detailed in the HPI.  Past Medical History: Past Medical History:  Diagnosis Date  . Allergy   . Dyspnea    former smoker. only doe  . History of kidney stones   . Peripheral vascular disease [facility])     Past Surgical History: Past Surgical History:  Procedure Laterality Date  . ANTERIOR CERVICAL DECOMP/DISCECTOMY FUSION N/A 03/14/2020   Procedure: ANTERIOR CERVICAL DECOMPRESSION/DISCECTOMY FUSION 3 LEVELS C4-7;  Surgeon: Meade Maw, MD;  Location: [facility] ORS;  Service: Neurosurgery;  Laterality: N/A;  . BACK SURGERY  2011  . CENTRAL LINE INSERTION Right 09/10/2016   Procedure: CENTRAL LINE INSERTION;  Surgeon: Katha Cabal, MD;  Location: [facility] ORS;  Service: Vascular;  Laterality: Right;  . CERVICAL FUSION     C4-c7 on Mar 14, 2020  . COLONOSCOPY  2013 ?  . FEMORAL-POPLITEAL BYPASS GRAFT Right 09/10/2016   Procedure: BYPASS GRAFT FEMORAL-POPLITEAL ARTERY ( BELOW KNEE );  Surgeon: Katha Cabal, MD;  Location: [facility] ORS;  Service: Vascular;  Laterality: Right;  . FEMORAL-TIBIAL BYPASS GRAFT Right 03/23/2020   Procedure: BYPASS GRAFT RIGHT FEMORAL- DISTAL TIBIAL ARTERY WITH DISTAL FLOW GRAFT;  Surgeon: Katha Cabal, MD;  Location: [facility]  ORS;  Service: Vascular;  Laterality: Right;  . LOWER EXTREMITY ANGIOGRAPHY Right 11/18/2016   Procedure: Lower Extremity Angiography;  Surgeon: Katha Cabal, MD;  Location: [place] [facility];  Service: Cardiovascular;  Laterality: Right;  . LOWER EXTREMITY ANGIOGRAPHY Right 12/09/2016   Procedure: Lower Extremity Angiography;  Surgeon: Katha Cabal, MD;  Location: [place] [facility];  Service: Cardiovascular;  Laterality: Right;  . LOWER EXTREMITY ANGIOGRAPHY Right 11/15/2019   Procedure: LOWER EXTREMITY ANGIOGRAPHY;  Surgeon: Katha Cabal, MD;  Location: [place] [facility];  Service: Cardiovascular;  Laterality: Right;  . LOWER EXTREMITY ANGIOGRAPHY Right 12/01/2019   Procedure: Lower Extremity Angiography;  Surgeon: Algernon Huxley, MD;  Location: [place] [facility];  Service: Cardiovascular;  Laterality: Right;  . LOWER EXTREMITY ANGIOGRAPHY Right 12/02/2019   Procedure: LOWER EXTREMITY ANGIOGRAPHY;  Surgeon: Katha Cabal, MD;  Location: [place] [facility];  Service: Cardiovascular;  Laterality: Right;  . LOWER EXTREMITY INTERVENTION  11/18/2016   Procedure: Lower Extremity Intervention;  Surgeon: Katha Cabal, MD;  Location: [place] [facility];  Service: Cardiovascular;;  . PERIPHERAL VASCULAR CATHETERIZATION Right 01/02/2015   Procedure: Lower Extremity Angiography;  Surgeon: Katha Cabal, MD;  Location: [place] [facility];  Service: Cardiovascular;  Laterality: Right;  . PERIPHERAL VASCULAR CATHETERIZATION Right 06/18/2015   Procedure: Lower Extremity Angiography;  Surgeon: Algernon Huxley, MD;  Location: [place] [facility];  Service: Cardiovascular;  Laterality: Right;  . PERIPHERAL VASCULAR CATHETERIZATION  06/18/2015   Procedure: Lower  Extremity Intervention;  Surgeon: Algernon Huxley, MD;  Location: [place] [facility];  Service: Cardiovascular;;  . PERIPHERAL VASCULAR CATHETERIZATION N/A 10/09/2015   Procedure: Abdominal Aortogram w/Lower Extremity;   Surgeon: Katha Cabal, MD;  Location: [place] [facility];  Service: Cardiovascular;  Laterality: N/A;  . PERIPHERAL VASCULAR CATHETERIZATION  10/09/2015   Procedure: Lower Extremity Intervention;  Surgeon: Katha Cabal, MD;  Location: [place] [facility];  Service: Cardiovascular;;  . PERIPHERAL VASCULAR CATHETERIZATION Right 10/10/2015   Procedure: Lower Extremity Angiography;  Surgeon: Algernon Huxley, MD;  Location: [place] [facility];  Service: Cardiovascular;  Laterality: Right;  . PERIPHERAL VASCULAR CATHETERIZATION Right 10/10/2015   Procedure: Lower Extremity Intervention;  Surgeon: Algernon Huxley, MD;  Location: [place] [facility];  Service: Cardiovascular;  Laterality: Right;  . PERIPHERAL VASCULAR CATHETERIZATION Right 12/03/2015   Procedure: Lower Extremity Angiography;  Surgeon: Algernon Huxley, MD;  Location: [place] [facility];  Service: Cardiovascular;  Laterality: Right;  . PERIPHERAL VASCULAR CATHETERIZATION Right 12/04/2015   Procedure: Lower Extremity Angiography;  Surgeon: Algernon Huxley, MD;  Location: [place] [facility];  Service: Cardiovascular;  Laterality: Right;  . PERIPHERAL VASCULAR CATHETERIZATION  12/04/2015   Procedure: Lower Extremity Intervention;  Surgeon: Algernon Huxley, MD;  Location: [place] [facility];  Service: Cardiovascular;;  . PERIPHERAL VASCULAR CATHETERIZATION Right 06/30/2016   Procedure: Lower Extremity Angiography;  Surgeon: Algernon Huxley, MD;  Location: [place] [facility];  Service: Cardiovascular;  Laterality: Right;  . PERIPHERAL VASCULAR CATHETERIZATION Right 06/25/2016   Procedure: Lower Extremity Angiography;  Surgeon: Katha Cabal, MD;  Location: [place] [facility];  Service: Cardiovascular;  Laterality: Right;  . PERIPHERAL VASCULAR CATHETERIZATION Right 07/01/2016   Procedure: Lower Extremity Angiography;  Surgeon: Katha Cabal, MD;  Location: [place] [facility];  Service: Cardiovascular;  Laterality: Right;  . PERIPHERAL  VASCULAR CATHETERIZATION  07/01/2016   Procedure: Lower Extremity Intervention;  Surgeon: Katha Cabal, MD;  Location: [place] [facility];  Service: Cardiovascular;;  . PERIPHERAL VASCULAR CATHETERIZATION Right 08/01/2016   Procedure: Lower Extremity Angiography;  Surgeon: Katha Cabal, MD;  Location: [place] [facility];  Service: Cardiovascular;  Laterality: Right;  . stent placement in right leg Right     Allergies: Allergies as of 03/22/2020  . (No Known Allergies)    Medications:  Current Facility-Administered Medications:  .  0.9 %  sodium chloride infusion, 500 mL, Intravenous, Once PRN, Schnier, Dolores Lory, MD .  0.9 %  sodium chloride infusion, 500 mL, Intravenous, Once PRN, Serafina Mitchell, MD .  0.9 %  sodium chloride infusion, , Intravenous, Continuous, Trula Slade, Butch Penny, MD, Last Rate: 75 mL/hr at 03/25/20 1200, Rate Verify at 03/25/20 1200 .  acetaminophen (TYLENOL) tablet 325-650 mg, 325-650 mg, Oral, Q4H PRN, 650 mg at 03/24/20 1258 **OR** acetaminophen (TYLENOL) suppository 325-650 mg, 325-650 mg, Rectal, Q4H PRN, Schnier, Dolores Lory, MD .  acetaminophen (TYLENOL) tablet 325-650 mg, 325-650 mg, Oral, Q4H PRN **OR** acetaminophen (TYLENOL) suppository 325-650 mg, 325-650 mg, Rectal, Q4H PRN, Serafina Mitchell, MD .  alum & mag hydroxide-simeth (MAALOX/MYLANTA) 200-200-20 MG/5ML suspension 15-30 mL, 15-30 mL, Oral, Q2H PRN, Schnier, Dolores Lory, MD .  alum & mag hydroxide-simeth (MAALOX/MYLANTA) 200-200-20 MG/5ML suspension 15-30 mL, 15-30 mL, Oral, Q2H PRN, Serafina Mitchell, MD .  aspirin EC tablet 81 mg, 81 mg, Oral, Daily, Schnier, Dolores Lory, MD, 81 mg at 03/25/20 0930 .  Chlorhexidine Gluconate  Cloth 2 % PADS 6 each, 6 each, Topical, Daily, Schnier, Dolores Lory, MD, 6 each at 03/24/20 0900 .  clopidogrel (PLAVIX) tablet 75 mg, 75 mg, Oral, Q0600, Serafina Mitchell, MD .  docusate sodium (COLACE) capsule 100 mg, 100 mg, Oral, Daily, Schnier, Dolores Lory, MD, 100 mg at  03/25/20 0930 .  docusate sodium (COLACE) capsule 100 mg, 100 mg, Oral, Daily, Brabham, Butch Penny, MD .  DOPamine (INTROPIN) 800 mg in dextrose 5 % 250 mL (3.2 mg/mL) infusion, 3-5 mcg/kg/min, Intravenous, Continuous, Schnier, Dolores Lory, MD, Held at 03/24/20 0010 .  famotidine (PEPCID) IVPB 20 mg premix, 20 mg, Intravenous, Q12H, Schnier, Dolores Lory, MD, Stopped at 03/25/20 1002 .  gabapentin (NEURONTIN) capsule 300 mg, 300 mg, Oral, QHS, Schnier, Dolores Lory, MD, 300 mg at 03/24/20 2210 .  guaiFENesin-dextromethorphan (ROBITUSSIN DM) 100-10 MG/5ML syrup 15 mL, 15 mL, Oral, Q4H PRN, Schnier, Dolores Lory, MD .  guaiFENesin-dextromethorphan (ROBITUSSIN DM) 100-10 MG/5ML syrup 15 mL, 15 mL, Oral, Q4H PRN, Serafina Mitchell, MD .  heparin ADULT infusion 100 units/mL (25000 units/241mL sodium chloride 0.45%), 1,300 Units/hr, Intravenous, Continuous, Hall, Scott A, RPH, Last Rate: 13 mL/hr at 03/25/20 1200, 1,300 Units/hr at 03/25/20 1200 .  hydrALAZINE (APRESOLINE) injection 5 mg, 5 mg, Intravenous, Q20 Min PRN, Schnier, Dolores Lory, MD .  hydrALAZINE (APRESOLINE) injection 5 mg, 5 mg, Intravenous, Q20 Min PRN, Serafina Mitchell, MD .  labetalol (NORMODYNE) injection 10 mg, 10 mg, Intravenous, Q10 min PRN, Schnier, Dolores Lory, MD .  labetalol (NORMODYNE) injection 10 mg, 10 mg, Intravenous, Q10 min PRN, Serafina Mitchell, MD .  magnesium sulfate IVPB 2 g 50 mL, 2 g, Intravenous, Daily PRN, Schnier, Dolores Lory, MD .  magnesium sulfate IVPB 2 g 50 mL, 2 g, Intravenous, Daily PRN, Serafina Mitchell, MD .  methocarbamol (ROBAXIN) tablet 500 mg, 500 mg, Oral, Q6H PRN, Schnier, Dolores Lory, MD .  metoprolol tartrate (LOPRESSOR) injection 2-5 mg, 2-5 mg, Intravenous, Q2H PRN, Schnier, Dolores Lory, MD .  metoprolol tartrate (LOPRESSOR) injection 2-5 mg, 2-5 mg, Intravenous, Q2H PRN, Serafina Mitchell, MD .  morphine 2 MG/ML injection 2-5 mg, 2-5 mg, Intravenous, Q1H PRN, Schnier, Dolores Lory, MD .  nitroGLYCERIN 50 mg in dextrose 5  % 250 mL (0.2 mg/mL) infusion, 5-250 mcg/min, Intravenous, Titrated, Schnier, Dolores Lory, MD, Held at 03/24/20 0011 .  ondansetron (ZOFRAN) injection 4 mg, 4 mg, Intravenous, Q6H PRN, Schnier, Dolores Lory, MD .  ondansetron [facility]) injection 4 mg, 4 mg, Intravenous, Q6H PRN, Serafina Mitchell, MD .  oxyCODONE-acetaminophen (PERCOCET/ROXICET) 5-325 MG per tablet 1-2 tablet, 1-2 tablet, Oral, Q4H PRN, Schnier, Dolores Lory, MD, 2 tablet at 03/24/20 0420 .  pantoprazole (PROTONIX) EC tablet 40 mg, 40 mg, Oral, Daily, Serafina Mitchell, MD, 40 mg at 03/25/20 0930 .  phenol (CHLORASEPTIC) mouth spray 1 spray, 1 spray, Mouth/Throat, PRN, Schnier, Dolores Lory, MD .  phenol (CHLORASEPTIC) mouth spray 1 spray, 1 spray, Mouth/Throat, PRN, Serafina Mitchell, MD .  potassium chloride SA (KLOR-CON) CR tablet 20-40 mEq, 20-40 mEq, Oral, Daily PRN, Schnier, Dolores Lory, MD .  potassium chloride SA (KLOR-CON) CR tablet 20-40 mEq, 20-40 mEq, Oral, Daily PRN, Serafina Mitchell, MD .  senna-docusate (Senokot-S) tablet 1 tablet, 1 tablet, Oral, QHS PRN, Schnier, Dolores Lory, MD .  simvastatin (ZOCOR) tablet 20 mg, 20 mg, Oral, q1800, Schnier, Dolores Lory, MD, 20 mg at 03/24/20 1746 .  sorbitol 70 % solution 30 mL, 30 mL,  Oral, Daily PRN, Schnier, Dolores Lory, MD   Social History: Social History   Tobacco Use  . Smoking status: Former Smoker    Packs/day: 1.50    Years: 38.00    Pack years: 57.00    Types: Cigarettes    Quit date: 12/20/2014    Years since quitting: 5.2  . Smokeless tobacco: Former Network engineer  . Vaping Use: Never used  Substance Use Topics  . Alcohol use: Not Currently  . Drug use: No    Family Medical History: Family History  Problem Relation Age of Onset  . Diabetes Mother   . Hypertension Mother   . Heart murmur Mother   . Colon cancer Neg Hx   . Breast cancer Neg Hx     Physical Examination: Vitals:   03/25/20 1100 03/25/20 1200  BP: 111/61 (!) 126/58  Pulse: 75 78  Resp: 12 15   Temp:    SpO2: 97% 99%     General: Patient is well developed, well nourished, calm, collected, and in no apparent distress.  Psychiatric: Patient is non-anxious.  Head:  Pupils equal, round, and reactive to light.  ENT:  Oral mucosa appears well hydrated.  Neck:   Supple.  Full range of motion.  Respiratory: Patient is breathing without any difficulty.  Extremities: No edema.  Vascular: Palpable pulses in dorsal pedal vessels.  Skin:   On exposed skin, there are no abnormal skin lesions.  NEUROLOGICAL:  General: In no acute distress.   Awake, alert, oriented to person, place, and time.  Pupils equal round and reactive to light.  Facial tone is symmetric.  Tongue protrusion is midline.  There is no pronator drift.   Strength: Side Biceps Triceps Deltoid Interossei Grip Wrist Ext. Wrist Flex.  R 5 5 5 5 5 5 5   L 5 5 5 5 5 5 5    Side Iliopsoas Quads Hamstring PF DF EHL  R 5 - - 5 5 5   L 5 5 5 5 5 5    Reflexes are 1+ and symmetric at the biceps, triceps, brachioradialis, patella and achilles.   Bilateral upper and lower extremity sensation is intact to light touch and pin prick.  Gait is untested.   Imaging: No new imaging to review  I have personally reviewed the images and agree with the above interpretation.  Labs: CBC Latest Ref Rng & Units 03/25/2020 03/24/2020 03/23/2020  WBC 4.0 - 10.5 K/uL 15.2(H) 12.3(H) 12.1(H)  Hemoglobin 13.0 - 17.0 g/dL 10.1(L) 10.9(L) 12.2(L)  Hematocrit 39 - 52 % 31.5(L) 34.3(L) 36.6(L)  Platelets 150 - 400 K/uL 216 208 233       Assessment and Plan: Mr. Carlberg is a pleasant 64 y.o. male with cervical myelopathy s/p ACDF.  He is now POD11 and doing well.  OK to remove collar when not ambulating.  Other care per vascular surgery.  I agree with use of whatever anticoagulation the vascular surgeons feel is indicated, given the significant risk to his leg if he were to be off anticoagulation after the identification of a new  thrombus.  I discussed with the patient the risk of a hematoma in his neck, which diminishes over time but is still a significant risk this close to surgery.  He expressed understanding.  Please call if there is any concern of a hematoma in the neck, or any change in neurologic function.  Fabianna Keats K. Izora Ribas MD, [place] [facility] of Neuro[facility]

## 2020-03-25 NOTE — Progress Notes (Signed)
    Subjective  - POD #1 and 2, s/p right fem-PT bypass and take back for thrombecotmy  Was able to get out of bed this am, did have some drainage from distal incision   Physical Exam:  Incisions clean and dry Palpable right PT pulse       Assessment/Plan:  POD #1 and 2  Patient is now on full dose heparin and Plavix.  No obvious reason for bypass occlusion.  Fortunately it remains patent.  I will continue IV heparin until discharge at which toime he will go back on Eliquis.  Encourage ambulation today  Likely transfer to floor tomorrow.  Advance to regular diet  D/c foley in am  Joshua [name] 03/25/2020 9:00 PM --  Vitals:   03/25/20 1700 03/25/20 1900  BP: 139/68 126/69  Pulse: 74 67  Resp: 17 11  Temp:  97.8 F (36.6 C)  SpO2: 95% 99%    Intake/Output Summary (Last 24 hours) at 03/25/2020 2100 Last data filed at 03/25/2020 1900 Gross per 24 hour  Intake 2173.16 ml  Output 2725 ml  Net -551.84 ml     Laboratory CBC    Component Value Date/Time   WBC 15.2 (H) 03/25/2020 0125   HGB 10.1 (L) 03/25/2020 0125   HGB 13.3 04/13/2019 1002   HCT 31.5 (L) 03/25/2020 0125   HCT 39.8 04/13/2019 1002   PLT 216 03/25/2020 0125   PLT 346 04/13/2019 1002    BMET    Component Value Date/Time   NA 137 03/24/2020 1232   NA 141 10/14/2019 1101   K 4.1 03/24/2020 1232   CL 106 03/24/2020 1232   CO2 22 03/24/2020 1232   GLUCOSE 121 (H) 03/24/2020 1232   BUN 17 03/24/2020 1232   BUN 15 10/14/2019 1101   BUN 16 07/03/2014 1205   CREATININE 0.86 03/24/2020 1232   CREATININE 1.34 (H) 07/03/2014 1205   CALCIUM 7.9 (L) 03/24/2020 1232   GFRNONAA >60 03/24/2020 1232   GFRNONAA 59 (L) 07/03/2014 1205   GFRAA >60 03/24/2020 1232   GFRAA >60 07/03/2014 1205    COAG Lab Results  Component Value Date   INR 1.0 03/05/2020   INR 1.11 06/25/2016   INR 1.90 05/12/2016   No results found for: PTT  Antibiotics Anti-infectives (From admission, onward)   Start      Dose/Rate Route Frequency Ordered Stop   03/24/20 1800  ceFAZolin (ANCEF) IVPB 2g/100 mL premix        2 g 200 mL/hr over 30 Minutes Intravenous Every 8 hours 03/24/20 1137 03/25/20 0538   03/24/20 0600  ceFAZolin (ANCEF) IVPB 2g/100 mL premix  Status:  Discontinued        2 g 200 mL/hr over 30 Minutes Intravenous On call to O.R. 03/23/20 2042 03/24/20 0018   03/24/20 0100  ceFAZolin (ANCEF) IVPB 1 g/50 mL premix        1 g 100 mL/hr over 30 Minutes Intravenous Every 8 hours 03/23/20 2119 03/24/20 1659   03/23/20 1015  ceFAZolin (ANCEF) IVPB 2g/100 mL premix        2 g 200 mL/hr over 30 Minutes Intravenous  Once 03/23/20 1002 03/23/20 1144       V. Leia Alf, M.D., [facility] [place] Office: [phone] Pager:  [phone]

## 2020-03-25 NOTE — Progress Notes (Addendum)
ANTICOAGULATION CONSULT NOTE  Pharmacy Consult for Heparin Indication: occluded right femoral posterior tibial bypass graft, s/p thrombectomy  No Known Allergies  Patient Measurements: Height: 6' (182.9 cm) Weight: 93.9 kg (207 lb) IBW/kg (Calculated) : 77.6 HEPARIN DW (KG): 93.9  Vital Signs: Temp: 98.4 F (36.9 C) (09/05 0800) Temp Source: Axillary (09/05 0800) BP: 129/61 (09/05 0700) Pulse Rate: 63 (09/05 0700)  Labs: Recent Labs    03/23/20 1022 03/23/20 1522 03/23/20 1522 03/23/20 2017 03/23/20 2017 03/24/20 1232 03/25/20 0125 03/25/20 0728  HGB  --  11.6*   < > 12.2*   < > 10.9* 10.1*  --   HCT  --  37.1*   < > 36.6*  --  34.3* 31.5*  --   PLT  --  262   < > 233  --  208 216  --   HEPARINUNFRC  --   --   --   --   --   --   --  0.56  CREATININE 0.91 0.87  --   --   --  0.86  --   --    < > = values in this interval not displayed.    Estimated Creatinine Clearance: 107.3 mL/min (by C-G formula based on SCr of 0.86 mg/dL).   Medical History: Past Medical History:  Diagnosis Date  . Allergy   . Dyspnea    former smoker. only doe  . History of kidney stones   . Peripheral vascular disease (HCC)     Medications:  Scheduled:  . aspirin EC  81 mg Oral Daily  . aspirin EC  81 mg Oral Daily  . Chlorhexidine Gluconate Cloth  6 each Topical Daily  . clopidogrel  75 mg Oral Q0600  . docusate sodium  100 mg Oral Daily  . docusate sodium  100 mg Oral Daily  . gabapentin  300 mg Oral QHS  . pantoprazole  40 mg Oral Daily  . simvastatin  20 mg Oral q1800   Assessment: 64 y.o. male presenting with occluded R femoral posterior tibial bypass graft now s/p thrombectomy. Pharmacy has been consulted for IV heparin dosing for full anticoagulation with NO BOLUS.  Today, initial heparin level is in therapeutic range at 0.56 after starting full anticoagulation last night when cleared by vascular surgery (previously on heparin 500 units/hr with no titration). Hgb down  somewhat s/p procedure and platelets WNL. Last SCr stable from previous. No issues with the infusion or overt bleeding noted, confirmed with pt's RN.  Goal of Therapy:  Heparin level 0.3-0.7 units/ml Monitor platelets by anticoagulation protocol: Yes  Plan:  Continue heparin infusion at 1300 units/hr Check confirmatory heparin level in 6 hours Monitor CBC, daily heparin level  Continue to monitor for signs/symptoms of bleeding   Brendolyn Patty, PharmD Clinical Pharmacist  03/25/2020   8:37 AM

## 2020-03-25 NOTE — Progress Notes (Signed)
ANTICOAGULATION CONSULT NOTE  Pharmacy Consult for Heparin Indication: occluded right femoral posterior tibial bypass graft, s/p thrombectomy  No Known Allergies  Patient Measurements: Height: 6' (182.9 cm) Weight: 93.9 kg (207 lb) IBW/kg (Calculated) : 77.6 HEPARIN DW (KG): 93.9  Vital Signs: Temp: 98.4 F (36.9 C) (09/05 0800) Temp Source: Axillary (09/05 0800) BP: 135/61 (09/05 1300) Pulse Rate: 79 (09/05 1400)  Labs: Recent Labs    03/23/20 1022 03/23/20 1522 03/23/20 1522 03/23/20 2017 03/23/20 2017 03/24/20 1232 03/25/20 0125 03/25/20 0728 03/25/20 1353  HGB  --  11.6*   < > 12.2*   < > 10.9* 10.1*  --   --   HCT  --  37.1*   < > 36.6*  --  34.3* 31.5*  --   --   PLT  --  262   < > 233  --  208 216  --   --   HEPARINUNFRC  --   --   --   --   --   --   --  0.56 0.37  CREATININE 0.91 0.87  --   --   --  0.86  --   --   --    < > = values in this interval not displayed.    Estimated Creatinine Clearance: 107.3 mL/min (by C-G formula based on SCr of 0.86 mg/dL).   Medical History: Past Medical History:  Diagnosis Date  . Allergy   . Dyspnea    former smoker. only doe  . History of kidney stones   . Peripheral vascular disease (HCC)     Medications:  Scheduled:  . aspirin EC  81 mg Oral Daily  . Chlorhexidine Gluconate Cloth  6 each Topical Daily  . clopidogrel  75 mg Oral Q0600  . docusate sodium  100 mg Oral Daily  . docusate sodium  100 mg Oral Daily  . gabapentin  300 mg Oral QHS  . pantoprazole  40 mg Oral Daily  . simvastatin  20 mg Oral q1800   Assessment: 64 y.o. male presenting with occluded R femoral posterior tibial bypass graft now s/p thrombectomy. Pharmacy has been consulted for IV heparin dosing for full anticoagulation with NO BOLUS.  Today, initial heparin level is in therapeutic range at 0.56 after starting full anticoagulation last night when cleared by vascular surgery (previously on heparin 500 units/hr with no titration). Hgb  down somewhat s/p procedure and platelets WNL. Last SCr stable from previous. No issues with the infusion or overt bleeding noted, confirmed with pt's RN.  Goal of Therapy:  Heparin level 0.3-0.7 units/ml Monitor platelets by anticoagulation protocol: Yes  Plan:   Anti-Xa level therapeutic: continue heparin infusion at 1300 units/hr  Check next heparin level with am labs 09/06  daily CBC while on heparin  Continue to monitor for signs/symptoms of bleeding  Vallery Sa, PharmD Clinical Pharmacist  03/25/2020   3:04 PM

## 2020-03-25 NOTE — Evaluation (Signed)
Physical Therapy Evaluation Patient Details Name: ERHARD SENSKE MRN: [id number] DOB: 31-Oct-1955 Today's Date: 03/25/2020   History of Present Illness  admitted for acute hospitalization s/p R LE angioplasty, mechanical thrombectomy (9/3), unable to resolve clot; s/p R profunda femoral artery to R tibial artery bypass and thrombectomy (9/3); return to OR for additional thrombectomy to right femoral posterior tibial bypass graft (9/4).  Recent PMH significant for ADCF (8/25), aspen collar to be worn when OOB.  Clinical Impression  Upon evaluation, patient alert and oriented; follows commands and agreeable to OOB activities.  Reports marked improvement in R foot sensory awareness since cervical surgery and R foot pain since vascular surgeries.  Demonstrates fair post-op strength and ROM to R LE upon assessment, limited by post-op pain/"tightness" and edema (R knee ROM approx 10-60 degrees).  Able to complete bed mobility with close sup; sit/stand, basic transfers and gait (5') from bed/chair with RW, cga/min assist.  Demonstrates 3-point, step to gait pattern, TDWB to PWB to R LE throughout (allowed patient to guide, limited by pain/"tightness"); mod WBing bilat UEs to offset R LE as able. Moderate sanguinous drainage noted after bed/chair transfer; RN informed/aware and at bedside to assess.  Additional activity deferred as result.  Will continue to assess/progress in subsequent sessions as medically appropriate. Would benefit from skilled PT to address above deficits and promote optimal return to PLOF.; Recommend transition to HHPT upon discharge from acute hospitalization.     Follow Up Recommendations Home health PT    Equipment Recommendations  Rolling walker with 5" wheels    Recommendations for Other Services       Precautions / Restrictions Precautions Precautions: Cervical;Fall Required Braces or Orthoses: Cervical Brace Cervical Brace:  (when OOB) Restrictions Weight Bearing  Restrictions: No Other Position/Activity Restrictions: No bending, lifting >10#, twisting, arching cervical region      Mobility  Bed Mobility Overal bed mobility: Needs Assistance Bed Mobility: Supine to Sit     Supine to sit: Min assist     General bed mobility comments: heavy use of bilat UEs to assist with truncal elevation  Transfers Overall transfer level: Needs assistance Equipment used: Rolling walker (2 wheeled) Transfers: Sit to/from Stand Sit to Stand: Min assist         General transfer comment: R LE anterior to BOS, using L LE as primary mechanism for lift-off  Ambulation/Gait Ambulation/Gait assistance: Min guard;Min assist Gait Distance (Feet): 5 Feet Assistive device: Rolling walker (2 wheeled)       General Gait Details: 3-point, step to gait pattern, TDWB to PWB to R LE throughout (allowed patient to guide, limited by pain/"tightness"); mod WBing bilat UEs to offset R LE as able. Moderate sanguinous drainage noted after bed/chair transfer; RN informed/aware and at bedside to assess.  Additional activity deferred as result.  Stairs            Wheelchair Mobility    Modified Rankin (Stroke Patients Only)       Balance Overall balance assessment: Needs assistance Sitting-balance support: No upper extremity supported;Feet supported Sitting balance-Leahy Scale: Good     Standing balance support: Bilateral upper extremity supported Standing balance-Leahy Scale: Fair                               Pertinent Vitals/Pain Pain Assessment: No/denies pain    Home Living Family/patient expects to be discharged to:: Private residence Living Arrangements: Alone Available Help at Discharge: Family;Friend(s);Available  PRN/intermittently Type of Home: House Home Access: Ramped entrance;Stairs to enter   Entrance Stairs-Number of Steps: 2 Home Layout: One level Home Equipment: Walker - 2 wheels;Cane - single point;Wheelchair -  [employer];Shower seat;Walker - 4 wheels      Prior Function Level of Independence: Independent with assistive device(s)         Comments: Mod indep with ADLs, household and community mobilization; intermittent use of SPC for mobility.     Hand Dominance        Extremity/Trunk Assessment   Upper Extremity Assessment Upper Extremity Assessment: Overall WFL for tasks assessed    Lower Extremity Assessment Lower Extremity Assessment: Generalized weakness (R LE generally limited in knee ROM due to post-op edema (ROM limited to patient tolerance-approx 10-60 degrees).  Denies sensory deficit bilat LEs)       Communication   Communication: No difficulties  Cognition Arousal/Alertness: Awake/alert Behavior During Therapy: WFL for tasks assessed/performed Overall Cognitive Status: Within Functional Limits for tasks assessed                                        General Comments      Exercises     Assessment/Plan    PT Assessment Patient needs continued PT services  PT Problem List Decreased strength;Decreased range of motion;Decreased activity tolerance;Decreased balance;Decreased mobility;Decreased skin integrity;Pain       PT Treatment Interventions Gait training;Stair training;Functional mobility training;Therapeutic activities;Therapeutic exercise;Balance training;Patient/family education;DME instruction    PT Goals (Current goals can be found in the Care Plan section)  Acute Rehab PT Goals Patient Stated Goal: to go home PT Goal Formulation: With patient Time For Goal Achievement: 04/08/20 Potential to Achieve Goals: Good    Frequency Min 2X/week   Barriers to discharge        Co-evaluation               AM-PAC PT "6 Clicks" Mobility  Outcome Measure Help needed turning from your back to your side while in a flat bed without using bedrails?: A Little Help needed moving from lying on your back to sitting on the side of  a flat bed without using bedrails?: A Little Help needed moving to and from a bed to a chair (including a wheelchair)?: A Little Help needed standing up from a chair using your arms (e.g., wheelchair or bedside chair)?: A Little Help needed to walk in hospital room?: A Little Help needed climbing 3-5 steps with a railing? : A Little 6 Click Score: 18    End of Session   Activity Tolerance: Patient tolerated treatment well Patient left: in chair;with call bell/phone within reach Nurse Communication: Mobility status PT Visit Diagnosis: Difficulty in walking, not elsewhere classified (R26.2);Muscle weakness (generalized) (M62.81);Pain Pain - Right/Left: Right Pain - part of body: Leg    Time: 6659-9357 PT Time Calculation (min) (ACUTE ONLY): 25 min   Charges:   PT Evaluation $PT Eval Moderate Complexity: 1 Mod PT Treatments $Therapeutic Activity: 8-22 mins        Jolaine Fryberger H. Owens Shark, PT, DPT, NCS 03/25/20, 10:20 AM [phone]

## 2020-03-25 NOTE — Anesthesia Postprocedure Evaluation (Signed)
Anesthesia Post Note  Patient: Joshua [name]  Procedure(s) Performed: THROMBECTOMY OF GREAT VESSEL (Right )  Patient location during evaluation: PACU Anesthesia Type: General Level of consciousness: awake and alert and oriented Pain management: pain level controlled Vital Signs Assessment: post-procedure vital signs reviewed and stable Respiratory status: spontaneous breathing Cardiovascular status: blood pressure returned to baseline Anesthetic complications: no   No complications documented.   Last Vitals:  Vitals:   03/25/20 1600 03/25/20 1700  BP: 118/69 139/68  Pulse: 64 74  Resp: 13 17  Temp:    SpO2: 100% 95%    Last Pain:  Vitals:   03/25/20 1330  TempSrc:   PainSc: 8                  [name]

## 2020-03-25 NOTE — Evaluation (Signed)
Occupational Therapy Evaluation Patient Details Name: Joshua [name] MRN: [id number] DOB: May 09, 1956 Today's Date: 03/25/2020    History of Present Illness Pt is a 64 y/o M who was adm for acute hospitalization s/p R LE angioplasty, mechanical thrombectomy (9/3), unable to resolve clot; s/p R profunda femoral artery to R tibial artery bypass and thrombectomy (9/3); return to OR for additional thrombectomy to right femoral posterior tibial bypass graft (9/4).  Recent PMH significant for ADCF (8/25), aspen collar to be worn when OOB.   Clinical Impression   Pt was seen for OT evaluation this date. Prior to hospital admission, pt was MOD I with ADLs/ADL mobility using aspen collar as he recently d/c'ed following ACDF. Pt lives [place] in [facility] with 2 STE. Currently pt demonstrates impairments as described below (See OT problem list) which functionally limit his ability to perform ADL/self-care tasks. Pt currently requires MOD A for LB ADLs in sitting and SETUP to MIN A with UB ADLs as well as MIN A for bed mobility (standing deferred at this time).  Pt would benefit from skilled OT to address noted impairments and functional limitations (see below for any additional details) in order to maximize safety and independence while minimizing falls risk and caregiver burden. Upon hospital discharge, recommend HHOT to maximize pt safety and return to functional independence during meaningful occupations of daily life.     Follow Up Recommendations  Home health OT    Equipment Recommendations  Tub/shower seat;Other (comment) (2WW, AE for LB ADLs.)    Recommendations for Other Services       Precautions / Restrictions Precautions Precautions: Cervical;Fall Required Braces or Orthoses: Cervical Brace Restrictions Weight Bearing Restrictions: No Other Position/Activity Restrictions: No bending, lifting >10#, twisting, arching cervical region      Mobility Bed Mobility Overal bed mobility: Needs  Assistance Bed Mobility: Supine to Sit;Sit to Supine     Supine to sit: Min assist;HOB elevated Sit to supine: Min guard   General bed mobility comments: use of UEs/bed rail  Transfers                 General transfer comment: NT as pt with some increased dressing saturation after mobilizing earlier this date and RN requests that session is light    Balance Overall balance assessment: Needs assistance Sitting-balance support: No upper extremity supported;Feet supported Sitting balance-Leahy Scale: Good         Standing balance comment: NT                           ADL either performed or assessed with clinical judgement   ADL Overall ADL's : Needs assistance/impaired                                       General ADL Comments: Setup to MIN A For seated UB ADLs, MOD A for seated LB ADLs.     Vision Patient Visual Report: No change from baseline       Perception     Praxis      Pertinent Vitals/Pain Pain Assessment: Faces Faces Pain Scale: Hurts little more Pain Location: R LE Pain Descriptors / Indicators: Tender Pain Intervention(s): Monitored during session;Premedicated before session (RN gave tylenol)     Hand Dominance Right   Extremity/Trunk Assessment Upper Extremity Assessment Upper Extremity Assessment: Overall WFL for tasks assessed   Lower  Extremity Assessment Lower Extremity Assessment: Defer to PT evaluation;RLE deficits/detail RLE Deficits / Details: noted decreased ROM during functional task assessment such as attmepting LBDsg       Communication Communication Communication: No difficulties   Cognition Arousal/Alertness: Awake/alert Behavior During Therapy: WFL for tasks assessed/performed Overall Cognitive Status: Within Functional Limits for tasks assessed                                     General Comments       Exercises Other Exercises Other Exercises: OT facilitates pt  participation in seated ADLs with pt noting decreased ROM tolerance in R LE, requiring MOD A. Introduction of potential need for AE use for LB ADLs to increase Indep/comfort with LB ADLs. Pt requires continued ed.   Shoulder Instructions      Home Living Family/patient expects to be discharged to:: Private residence Living Arrangements: Alone Available Help at Discharge: Family;Friend(s);Available PRN/intermittently Type of Home: House Home Access: Ramped entrance;Stairs to enter Entrance Stairs-Number of Steps: 2   Home Layout: One level     Bathroom Shower/Tub: Tub/shower unit     Bathroom Accessibility: Yes   Home Equipment: Environmental consultant - 2 wheels;Cane - single point;Wheelchair - [employer];Shower seat;Walker - 4 wheels   Additional Comments: reports much of the equipment was his mom's as he was caring for her and she passed in Feb of this year (2021)      Prior Functioning/Environment Level of Independence: Independent with assistive device(s)        Comments: Mod indep with ADLs, household and community mobilization; intermittent use of SPC for mobility.        OT Problem List: Decreased activity tolerance;Impaired balance (sitting and/or standing);Decreased knowledge of use of DME or AE;Decreased knowledge of precautions;Pain      OT Treatment/Interventions: Therapeutic activities;Self-care/ADL training;DME and/or AE instruction;Balance training;Therapeutic exercise;Patient/family education    OT Goals(Current goals can be found in the care plan section) Acute Rehab OT Goals Patient Stated Goal: to go home OT Goal Formulation: With patient Time For Goal Achievement: 04/08/20 Potential to Achieve Goals: Good ADL Goals Pt Will Perform Lower Body Dressing: with supervision;with adaptive equipment;sit to/from stand (with adherance to back precautions) Pt Will Transfer to Toilet: with supervision;ambulating (BSC over standard commode PRN with LRAD for amb to  restroom) Pt Will Perform Toileting - Clothing Manipulation and hygiene: with supervision;sit to/from stand  OT Frequency: Min 2X/week   Barriers to D/C:            Co-evaluation              AM-PAC OT "6 Clicks" Daily Activity     Outcome Measure Help from another person eating meals?: None Help from another person taking care of personal grooming?: A Little Help from another person toileting, which includes using toliet, bedpan, or urinal?: A Little Help from another person bathing (including washing, rinsing, drying)?: A Lot Help from another person to put on and taking off regular upper body clothing?: A Little Help from another person to put on and taking off regular lower body clothing?: A Lot 6 Click Score: 17   End of Session    Activity Tolerance: Patient tolerated treatment well Patient left: in bed;with call bell/phone within reach  OT Visit Diagnosis: Unsteadiness on feet (R26.81);Muscle weakness (generalized) (M62.81)                Time: 6378-5885  OT Time Calculation (min): 38 min Charges:  OT General Charges $OT Visit: 1 Visit OT Evaluation $OT Eval Moderate Complexity: 1 Mod OT Treatments $Self Care/Home Management : 8-22 mins $Therapeutic Activity: 8-22 mins  Gerrianne Scale, MS, OTR/L ascom [phone] 03/25/20, 4:42 PM

## 2020-03-26 ENCOUNTER — Other Ambulatory Visit: Payer: Self-pay | Admitting: Family Medicine

## 2020-03-26 LAB — CBC
HCT: 31 % — ABNORMAL LOW (ref 39.0–52.0)
Hemoglobin: 10.1 g/dL — ABNORMAL LOW (ref 13.0–17.0)
MCH: 26.4 pg (ref 26.0–34.0)
MCHC: 32.6 g/dL (ref 30.0–36.0)
MCV: 80.9 fL (ref 80.0–100.0)
Platelets: 195 10*3/uL (ref 150–400)
RBC: 3.83 MIL/uL — ABNORMAL LOW (ref 4.22–5.81)
RDW: 15.3 % (ref 11.5–15.5)
WBC: 10 10*3/uL (ref 4.0–10.5)
nRBC: 0 % (ref 0.0–0.2)

## 2020-03-26 LAB — HEPARIN LEVEL (UNFRACTIONATED): Heparin Unfractionated: 0.62 IU/mL (ref 0.30–0.70)

## 2020-03-26 NOTE — Progress Notes (Signed)
Pt Alert and oriented throughout shift. Removed foley per order, small amount of urine output. NSR, BP WNL, RA, Right leg +3 edema, MD made aware. Pulses felt throughout shift. No bleeding found, still on heparin gtt. Have given prn pain med as needed. Will continue to monitor.

## 2020-03-26 NOTE — Progress Notes (Signed)
ANTICOAGULATION CONSULT NOTE  Pharmacy Consult for Heparin Indication: occluded right femoral posterior tibial bypass graft, s/p thrombectomy  No Known Allergies  Patient Measurements: Height: 6' (182.9 cm) Weight: 93.9 kg (207 lb) IBW/kg (Calculated) : 77.6 HEPARIN DW (KG): 93.9  Vital Signs: Temp: 98.2 F (36.8 C) (09/06 0000) Temp Source: Oral (09/06 0000) BP: 111/53 (09/06 0200) Pulse Rate: 64 (09/06 0200)  Labs: Recent Labs    03/23/20 1022 03/23/20 1522 03/23/20 2017 03/24/20 1232 03/24/20 1232 03/25/20 0125 03/25/20 0728 03/25/20 1353 03/26/20 0415  HGB  --  11.6*   < > 10.9*   < > 10.1*  --   --  10.1*  HCT  --  37.1*   < > 34.3*  --  31.5*  --   --  31.0*  PLT  --  262   < > 208  --  216  --   --  195  HEPARINUNFRC  --   --   --   --   --   --  0.56 0.37 0.62  CREATININE 0.91 0.87  --  0.86  --   --   --   --   --    < > = values in this interval not displayed.    Estimated Creatinine Clearance: 107.3 mL/min (by C-G formula based on SCr of 0.86 mg/dL).   Medical History: Past Medical History:  Diagnosis Date  . Allergy   . Dyspnea    former smoker. only doe  . History of kidney stones   . Peripheral vascular disease (HCC)     Medications:  Scheduled:  . aspirin EC  81 mg Oral Daily  . Chlorhexidine Gluconate Cloth  6 each Topical Daily  . clopidogrel  75 mg Oral Q0600  . docusate sodium  100 mg Oral Daily  . docusate sodium  100 mg Oral Daily  . gabapentin  300 mg Oral QHS  . pantoprazole  40 mg Oral Daily  . simvastatin  20 mg Oral q1800   Assessment: 64 y.o. male presenting with occluded R femoral posterior tibial bypass graft now s/p thrombectomy. Pharmacy has been consulted for IV heparin dosing for full anticoagulation with NO BOLUS.  Today, initial heparin level is in therapeutic range at 0.56 after starting full anticoagulation last night when cleared by vascular surgery (previously on heparin 500 units/hr with no titration). Hgb down  somewhat s/p procedure and platelets WNL. Last SCr stable from previous. No issues with the infusion or overt bleeding noted, confirmed with pt's RN.  Goal of Therapy:  Heparin level 0.3-0.7 units/ml Monitor platelets by anticoagulation protocol: Yes  Plan:   Anti-Xa level therapeutic: CBC stable, continue heparin infusion at 1300 units/hr  Check next heparin level with am labs 09/07  daily CBC while on heparin  Continue to monitor for signs/symptoms of bleeding  Hart Robinsons, PharmD Clinical Pharmacist  03/26/2020   6:08 AM

## 2020-03-26 NOTE — Progress Notes (Signed)
    Subjective  - POD #2,3  Due to patient safety concerns, this visit was conducted virtually with the assistance of his nurse  He was bale to get out of bed today and walk  Foley was removed   Physical Exam:  Palpable right PT Pulse Mild edema Incisions without drainage       Assessment/Plan:  POD #2,3  Change to step down status with transfer to floor likely later today  PT/OT  Continue IV heparin and palvix  Joshua [name] 03/26/2020 5:54 PM --  Vitals:   03/26/20 1600 03/26/20 1700  BP: 136/78 125/68  Pulse: 77 77  Resp: 13 13  Temp: 98.4 F (36.9 C)   SpO2: 99% 100%    Intake/Output Summary (Last 24 hours) at 03/26/2020 1754 Last data filed at 03/26/2020 1200 Gross per 24 hour  Intake 1837.75 ml  Output 1275 ml  Net 562.75 ml     Laboratory CBC    Component Value Date/Time   WBC 10.0 03/26/2020 0415   HGB 10.1 (L) 03/26/2020 0415   HGB 13.3 04/13/2019 1002   HCT 31.0 (L) 03/26/2020 0415   HCT 39.8 04/13/2019 1002   PLT 195 03/26/2020 0415   PLT 346 04/13/2019 1002    BMET    Component Value Date/Time   NA 137 03/24/2020 1232   NA 141 10/14/2019 1101   K 4.1 03/24/2020 1232   CL 106 03/24/2020 1232   CO2 22 03/24/2020 1232   GLUCOSE 121 (H) 03/24/2020 1232   BUN 17 03/24/2020 1232   BUN 15 10/14/2019 1101   BUN 16 07/03/2014 1205   CREATININE 0.86 03/24/2020 1232   CREATININE 1.34 (H) 07/03/2014 1205   CALCIUM 7.9 (L) 03/24/2020 1232   GFRNONAA >60 03/24/2020 1232   GFRNONAA 59 (L) 07/03/2014 1205   GFRAA >60 03/24/2020 1232   GFRAA >60 07/03/2014 1205    COAG Lab Results  Component Value Date   INR 1.0 03/05/2020   INR 1.11 06/25/2016   INR 1.90 05/12/2016   No results found for: PTT  Antibiotics Anti-infectives (From admission, onward)   Start     Dose/Rate Route Frequency Ordered Stop   03/24/20 1800  ceFAZolin (ANCEF) IVPB 2g/100 mL premix        2 g 200 mL/hr over 30 Minutes Intravenous Every 8 hours 03/24/20  1137 03/25/20 0538   03/24/20 0600  ceFAZolin (ANCEF) IVPB 2g/100 mL premix  Status:  Discontinued        2 g 200 mL/hr over 30 Minutes Intravenous On call to O.R. 03/23/20 2042 03/24/20 0018   03/24/20 0100  ceFAZolin (ANCEF) IVPB 1 g/50 mL premix        1 g 100 mL/hr over 30 Minutes Intravenous Every 8 hours 03/23/20 2119 03/24/20 1659   03/23/20 1015  ceFAZolin (ANCEF) IVPB 2g/100 mL premix        2 g 200 mL/hr over 30 Minutes Intravenous  Once 03/23/20 1002 03/23/20 1144       V. Leia Alf, M.D., [facility] [place] Office: [phone] Pager:  [phone]

## 2020-03-27 ENCOUNTER — Encounter: Payer: Self-pay | Admitting: Vascular Surgery

## 2020-03-27 DIAGNOSIS — I70211 Atherosclerosis of native arteries of extremities with intermittent claudication, right leg: Secondary | ICD-10-CM

## 2020-03-27 LAB — CBC
HCT: 32.5 % — ABNORMAL LOW (ref 39.0–52.0)
Hemoglobin: 10.5 g/dL — ABNORMAL LOW (ref 13.0–17.0)
MCH: 26.2 pg (ref 26.0–34.0)
MCHC: 32.3 g/dL (ref 30.0–36.0)
MCV: 81 fL (ref 80.0–100.0)
Platelets: 222 10*3/uL (ref 150–400)
RBC: 4.01 MIL/uL — ABNORMAL LOW (ref 4.22–5.81)
RDW: 15.3 % (ref 11.5–15.5)
WBC: 12 10*3/uL — ABNORMAL HIGH (ref 4.0–10.5)
nRBC: 0 % (ref 0.0–0.2)

## 2020-03-27 LAB — HEPARIN LEVEL (UNFRACTIONATED): Heparin Unfractionated: 0.31 IU/mL (ref 0.30–0.70)

## 2020-03-27 NOTE — Care Management Important Message (Signed)
Important Message  Patient Details  Name: Joshua [name] MRN: [id number] Date of Birth: 09-05-1955   Medicare Important Message Given:  Yes     [name] 03/27/2020, 11:58 AM

## 2020-03-27 NOTE — Progress Notes (Signed)
Physical Therapy Treatment Patient Details Name: Joshua [name] MRN: [id number] DOB: [birth year] Today's Date: 03/27/2020    History of Present Illness Pt is a 64 y/o M who was adm for acute hospitalization s/p R LE angioplasty, mechanical thrombectomy (9/3), unable to resolve clot; s/p R profunda femoral artery to R tibial artery bypass and thrombectomy (9/3); return to OR for additional thrombectomy to right femoral posterior tibial bypass graft (9/4).  Recent PMH significant for ADCF (8/25), aspen collar to be worn when OOB.    PT Comments    Pt was long sitting in bed with two pillows under R knee to elevated + RN in room. He agrees to PT session with encouragement. " I was up with OT already." He was very limited by pain. Performed log roll to R to short sit. Cervical collar placed while pt was sitting EOB. He stood two times EOB. Min assist on first bout and CGA on second. Took ~ 3-4 steps(hops) to [facility] 2/2 to inability to tolerate wt bearing on RLE. Reports 10/10 pain in standing. Pt does not want/unwilling to go to rehab at [place]. Recommend HHPT to follow once pt is medically stable. Will continue current POC and progress as able per pt tolerance. He was in bed with call bell in reach, pillows to promote extension, and bed alarm set.   Follow Up Recommendations  Home health PT     Equipment Recommendations  Rolling walker with 5" wheels;Other (comment) (pt has W/C and ramp for home entry)    Recommendations for Other Services       Precautions / Restrictions Precautions Precautions: Cervical;Fall Required Braces or Orthoses: Cervical Brace Restrictions Weight Bearing Restrictions: No Other Position/Activity Restrictions: No bending, lifting >10#, twisting, arching cervical region    Mobility  Bed Mobility Overal bed mobility: Needs Assistance Bed Mobility: Supine to Sit;Sit to Supine     Supine to sit: Min assist;HOB elevated Sit to supine: Min assist   General bed  mobility comments: use of UEs/bed rail  Transfers Overall transfer level: Needs assistance Equipment used: None Transfers: Sit to/from Stand Sit to Stand: Mod assist         General transfer comment: mod lifting assistance to stand from edge of bed  Ambulation/Gait Ambulation/Gait assistance: Min guard;Min assist Gait Distance (Feet): 3 Feet Assistive device: Rolling walker (2 wheeled) Gait Pattern/deviations: Step-to pattern;Trunk flexed     General Gait Details: Pt required extensive vcs for improved wt bearing on RLE. pt only able to tolerate ~ TTWB/PWB. very minimal wt placed on RLE throughout standing. Pt reports 10/10 pain in standing.       Balance Overall balance assessment: Needs assistance Sitting-balance support: No upper extremity supported;Feet supported Sitting balance-Leahy Scale: Good     Standing balance support: Bilateral upper extremity supported Standing balance-Leahy Scale: Fair Standing balance comment: heavy reliance on RW for UE support to maintain standing balance.         Cognition Arousal/Alertness: Awake/alert Behavior During Therapy: WFL for tasks assessed/performed Overall Cognitive Status: Within Functional Limits for tasks assessed           General Comments: Pt is A and O x 4. needs encouragement to participate             Pertinent Vitals/Pain Pain Assessment: Faces Pain Score: 10-Worst pain ever (with wt bearing) Faces Pain Scale: Hurts even more Pain Location: R LE Pain Descriptors / Indicators: Discomfort;Guarding Pain Intervention(s): Limited activity within patient's tolerance;Monitored during session;Premedicated before session;Repositioned  PT Goals (current goals can now be found in the care plan section) Acute Rehab PT Goals Patient Stated Goal: to go home Progress towards PT goals: Progressing toward goals    Frequency    Min 2X/week      PT Plan Current plan remains appropriate        AM-PAC PT "6 Clicks" Mobility   Outcome Measure  Help needed turning from your back to your side while in a flat bed without using bedrails?: A Little Help needed moving from lying on your back to sitting on the side of a flat bed without using bedrails?: A Little Help needed moving to and from a bed to a chair (including a wheelchair)?: A Little Help needed standing up from a chair using your arms (e.g., wheelchair or bedside chair)?: A Little Help needed to walk in hospital room?: A Little Help needed climbing 3-5 steps with a railing? : A Lot 6 Click Score: 17    End of Session Equipment Utilized During Treatment: Gait belt Activity Tolerance: Patient limited by pain Patient left: in bed;with call bell/phone within reach;with bed alarm set (pt unwilling to sit in recliner) Nurse Communication: Mobility status PT Visit Diagnosis: Difficulty in walking, not elsewhere classified (R26.2);Muscle weakness (generalized) (M62.81);Pain Pain - Right/Left: Right Pain - part of body: Leg     Time: 3419-6222 PT Time Calculation (min) (ACUTE ONLY): 18 min  Charges:  $Therapeutic Activity: 8-22 mins                     Julaine Fusi PTA 03/27/20, 12:54 PM

## 2020-03-27 NOTE — Progress Notes (Signed)
ANTICOAGULATION CONSULT NOTE  Pharmacy Consult for Heparin Indication: occluded right femoral posterior tibial bypass graft, s/p thrombectomy  No Known Allergies  Patient Measurements: Height: 6' (182.9 cm) Weight: 93.9 kg (207 lb) IBW/kg (Calculated) : 77.6 HEPARIN DW (KG): 93.9  Vital Signs: Temp: 97.6 F (36.4 C) (09/07 0317) Temp Source: Oral (09/07 0317) BP: 127/65 (09/07 0317) Pulse Rate: 81 (09/07 0317)  Labs: Recent Labs    03/24/20 1232 03/24/20 1232 03/25/20 0125 03/25/20 0125 03/25/20 0728 03/25/20 1353 03/26/20 0415 03/27/20 0450  HGB 10.9*   < > 10.1*   < >  --   --  10.1* 10.5*  HCT 34.3*   < > 31.5*  --   --   --  31.0* 32.5*  PLT 208   < > 216  --   --   --  195 222  HEPARINUNFRC  --   --   --   --    < > 0.37 0.62 0.31  CREATININE 0.86  --   --   --   --   --   --   --    < > = values in this interval not displayed.    Estimated Creatinine Clearance: 107.3 mL/min (by C-G formula based on SCr of 0.86 mg/dL).   Medical History: Past Medical History:  Diagnosis Date  . Allergy   . Dyspnea    former smoker. only doe  . History of kidney stones   . Peripheral vascular disease (HCC)     Medications:  Scheduled:  . aspirin EC  81 mg Oral Daily  . Chlorhexidine Gluconate Cloth  6 each Topical Daily  . clopidogrel  75 mg Oral Q0600  . docusate sodium  100 mg Oral Daily  . docusate sodium  100 mg Oral Daily  . gabapentin  300 mg Oral QHS  . pantoprazole  40 mg Oral Daily  . simvastatin  20 mg Oral q1800   Assessment: 64 y.o. male presenting with occluded R femoral posterior tibial bypass graft now s/p thrombectomy. Pharmacy has been consulted for IV heparin dosing for full anticoagulation with NO BOLUS.  Today, initial heparin level is in therapeutic range at 0.56 after starting full anticoagulation last night when cleared by vascular surgery (previously on heparin 500 units/hr with no titration). Hgb down somewhat s/p procedure and platelets  WNL. Last SCr stable from previous. No issues with the infusion or overt bleeding noted, confirmed with pt's RN.  Goal of Therapy:  Heparin level 0.3-0.7 units/ml Monitor platelets by anticoagulation protocol: Yes  Plan:  09/07 @ 0500 HL 0.31 trending down but therapeutic. Will continue current rate and will recheck HL w/ am labs, CBC low stable will continue to monitor.  Tobie Lords, PharmD, BCPS Clinical Pharmacist  03/27/2020   6:23 AM

## 2020-03-27 NOTE — Progress Notes (Signed)
Occupational Therapy Treatment Patient Details Name: Joshua [name] MRN: [id number] DOB: 03-31-1956 Today's Date: 03/27/2020    History of present illness Pt is a 64 y/o M who was adm for acute hospitalization s/p R LE angioplasty, mechanical thrombectomy (9/3), unable to resolve clot; s/p R profunda femoral artery to R tibial artery bypass and thrombectomy (9/3); return to OR for additional thrombectomy to right femoral posterior tibial bypass graft (9/4).  Recent PMH significant for ADCF (8/25), aspen collar to be worn when OOB.   OT comments  Upon entering the room, pt supine in bed and reports, "I'm not getting out of bed". Pt is agreeable to education regarding use of AE for LB dressing. OT educating and demonstrating use of LH reacher and sock aide. Pt declines to return demonstrations but does report he has this AE at home from his mother. Pt is then agreeable to sit EOB with min assist for R LE and increased time. Pt sitting for several minutes as he reports feeling dizzy. Pt then standing with mod lifting assistance and taking 2 side steps towards R with pt putting less than 10% of weight through R LE. Pt sitting and then feeling very nauseated. Sit >supine with assist to get R LE into bed and repositioned. Pt in bed with all needs within reach. Pt refused to sit/transfer to recliner chair. Pt making progress towards OT goals but needing increased time and encouragement to participate. OT continues to recommend HHOT at discharge.  Follow Up Recommendations  Home health OT    Equipment Recommendations  Tub/shower seat       Precautions / Restrictions Precautions Precautions: Cervical;Fall Required Braces or Orthoses: Cervical Brace Restrictions Weight Bearing Restrictions: No Other Position/Activity Restrictions: No bending, lifting >10#, twisting, arching cervical region       Mobility Bed Mobility Overal bed mobility: Needs Assistance Bed Mobility: Supine to Sit;Sit to  Supine     Supine to sit: Min assist;HOB elevated Sit to supine: Min assist   General bed mobility comments: use of UEs/bed rail  Transfers Overall transfer level: Needs assistance Equipment used: None Transfers: Sit to/from Stand Sit to Stand: Mod assist         General transfer comment: mod lifting assistance to stand from edge of bed    Balance Overall balance assessment: Needs assistance Sitting-balance support: No upper extremity supported;Feet supported Sitting balance-Leahy Scale: Good     Standing balance support: Bilateral upper extremity supported Standing balance-Leahy Scale: Poor Standing balance comment: reliance on rail and assist from therapist        ADL either performed or assessed with clinical judgement        Vision Patient Visual Report: No change from baseline            Cognition Arousal/Alertness: Awake/alert Behavior During Therapy: WFL for tasks assessed/performed Overall Cognitive Status: Within Functional Limits for tasks assessed     General Comments: Pt is A and O x 4. needs encouragement to participate                   Pertinent Vitals/ Pain       Pain Assessment: Faces Pain Score: 10-Worst pain ever (with wt bearing) Faces Pain Scale: Hurts even more Pain Location: R LE Pain Descriptors / Indicators: Discomfort;Guarding Pain Intervention(s): Limited activity within patient's tolerance;Monitored during session;Premedicated before session;Repositioned         Frequency  Min 2X/week        Progress Toward Goals  OT  Goals(current goals can now be found in the care plan section)  Progress towards OT goals: Progressing toward goals  Acute Rehab OT Goals Patient Stated Goal: to go home OT Goal Formulation: With patient Time For Goal Achievement: 04/08/20 Potential to Achieve Goals: Good  Plan Discharge plan remains appropriate       AM-PAC OT "6 Clicks" Daily Activity     Outcome Measure   Help from  another person eating meals?: None Help from another person taking care of personal grooming?: A Little Help from another person toileting, which includes using toliet, bedpan, or urinal?: A Little Help from another person bathing (including washing, rinsing, drying)?: A Lot Help from another person to put on and taking off regular upper body clothing?: A Little Help from another person to put on and taking off regular lower body clothing?: A Lot 6 Click Score: 17    End of Session    OT Visit Diagnosis: Unsteadiness on feet (R26.81);Muscle weakness (generalized) (M62.81)   Activity Tolerance Patient tolerated treatment well   Patient Left in bed;with call bell/phone within reach   Nurse Communication Mobility status;Precautions        Time: 1014-1100 OT Time Calculation (min): 46 min  Charges: OT General Charges $OT Visit: 1 Visit OT Treatments $Self Care/Home Management : 8-22 mins $Therapeutic Activity: 23-37 mins  Darleen Crocker, MS, OTR/L , CBIS ascom [phone]  03/27/20, 12:53 PM

## 2020-03-27 NOTE — Progress Notes (Signed)
[facility] Daily Progress Note   Subjective: 03/24/20: Thrombectomy of right femoral to posterior tibial artery bypass graft  03/23/20: 1. right profunda femoral artery to right posterior tibial artery bypass with 6 mm distal flow graft 2. Thromboembolectomy right posterior tibial artery with a #2 Fogarty  03/23/20: 1. Introduction catheter into right lower extremity 3rd order catheter placement  2. Contrast injection right lower extremity for distal runoff   3. Percutaneous transluminal angioplasty right femoral-tibial bypass to 6 mm  4. Mechanical thrombectomy with the penumbra CAT 7 device right femoral to tibial bypass             5.  Star close closure left common femoral arteriotomy  Patient without complaint this afternoon.  No issues overnight.  Objective: Vitals:   03/26/20 2336 03/27/20 0317 03/27/20 0806 03/27/20 1022  BP: (!) 156/68 127/65 112/62 (!) 152/73  Pulse: 90 81 86 87  Resp: 18 17 16 16   Temp: 97.8 F (36.6 C) 97.6 F (36.4 C) 98.4 F (36.9 C) 98.1 F (36.7 C)  TempSrc: Oral Oral Oral Oral  SpO2: 100% 100% 100% 100%  Weight:      Height:        Intake/Output Summary (Last 24 hours) at 03/27/2020 1332 Last data filed at 03/27/2020 0600 Gross per 24 hour  Intake 1871.01 ml  Output 600 ml  Net 1271.01 ml   Physical Exam: A&Ox3, NAD CV: RRR Pulmonary: CTA Bilaterally Abdomen: Soft, Nontender, Nondistended Vascular:  Right Lower Extremity: Thigh soft.  Calf soft.  Extremities moderately minutes.  Warm distally to toes.  Motor / sensory intact.  Incisions: OR dressings removed.  Staples clean dry and intact.   Laboratory: CBC    Component Value Date/Time   WBC 12.0 (H) 03/27/2020 0450   HGB 10.5 (L) 03/27/2020 0450   HGB 13.3 04/13/2019 1002   HCT 32.5 (L) 03/27/2020 0450   HCT 39.8 04/13/2019 1002   PLT 222 03/27/2020 0450   PLT 346 04/13/2019 1002   BMET    Component  Value Date/Time   NA 137 03/24/2020 1232   NA 141 10/14/2019 1101   K 4.1 03/24/2020 1232   CL 106 03/24/2020 1232   CO2 22 03/24/2020 1232   GLUCOSE 121 (H) 03/24/2020 1232   BUN 17 03/24/2020 1232   BUN 15 10/14/2019 1101   BUN 16 07/03/2014 1205   CREATININE 0.86 03/24/2020 1232   CREATININE 1.34 (H) 07/03/2014 1205   CALCIUM 7.9 (L) 03/24/2020 1232   GFRNONAA >60 03/24/2020 5093   GFRNONAA 59 (L) 07/03/2014 1205   GFRAA >60 03/24/2020 1232   GFRAA >60 07/03/2014 1205   Assessment/Planning: The patient is a 64 year old male multiple medical issues including 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  Staples are clean dry and intact however the leg is edematous will paint with Betadine and apply gauze dressing 3 times a day  2) currently on heparin.  We will continue for another 24 hours then transition to aspirin and Eliquis.  3) ambulation, physical therapy and occupational Therapy  4) leukocytosis - CBC in a.m.  Seen and examined with Dr. Eber Hong Jerrik Housholder PA-C 03/27/2020 1:32 PM

## 2020-03-28 LAB — HEPARIN LEVEL (UNFRACTIONATED)
Heparin Unfractionated: 0.25 IU/mL — ABNORMAL LOW (ref 0.30–0.70)
Heparin Unfractionated: 0.31 IU/mL (ref 0.30–0.70)

## 2020-03-28 LAB — BASIC METABOLIC PANEL
Anion gap: 4 — ABNORMAL LOW (ref 5–15)
BUN: 11 mg/dL (ref 8–23)
CO2: 28 mmol/L (ref 22–32)
Calcium: 8.2 mg/dL — ABNORMAL LOW (ref 8.9–10.3)
Chloride: 101 mmol/L (ref 98–111)
Creatinine, Ser: 0.76 mg/dL (ref 0.61–1.24)
GFR calc Af Amer: >60 mL/min (ref >60)
GFR calc non Af Amer: >60 mL/min (ref >60)
Glucose, Bld: 116 mg/dL — ABNORMAL HIGH (ref 70–99)
Potassium: 4.1 mmol/L (ref 3.5–5.1)
Sodium: 133 mmol/L — ABNORMAL LOW (ref 135–145)

## 2020-03-28 LAB — CBC
HCT: 31.5 % — ABNORMAL LOW (ref 39.0–52.0)
Hemoglobin: 10.7 g/dL — ABNORMAL LOW (ref 13.0–17.0)
MCH: 26.4 pg (ref 26.0–34.0)
MCHC: 34 g/dL (ref 30.0–36.0)
MCV: 77.8 fL — ABNORMAL LOW (ref 80.0–100.0)
Platelets: 240 10*3/uL (ref 150–400)
RBC: 4.05 MIL/uL — ABNORMAL LOW (ref 4.22–5.81)
RDW: 15.3 % (ref 11.5–15.5)
WBC: 11.8 10*3/uL — ABNORMAL HIGH (ref 4.0–10.5)
nRBC: 0 % (ref 0.0–0.2)

## 2020-03-28 LAB — SURGICAL PATHOLOGY

## 2020-03-28 LAB — MAGNESIUM: Magnesium: 2.2 mg/dL (ref 1.7–2.4)

## 2020-03-28 MED ORDER — BISACODYL 5 MG PO TBEC
5.0000 mg | DELAYED_RELEASE_TABLET | Freq: Every day | ORAL | Status: DC | PRN
  Administered 2020-03-28: 5 mg via ORAL
  Filled 2020-03-28: qty 1

## 2020-03-28 MED ORDER — POLYETHYLENE GLYCOL 3350 17 G PO PACK
17.0000 g | PACK | Freq: Every day | ORAL | Status: DC | PRN
  Filled 2020-03-28: qty 1

## 2020-03-28 MED ORDER — APIXABAN 5 MG PO TABS
5.0000 mg | ORAL_TABLET | Freq: Two times a day (BID) | ORAL | Status: DC
  Administered 2020-03-28 – 2020-03-31 (×7): 5 mg via ORAL
  Filled 2020-03-28 (×7): qty 1

## 2020-03-28 MED ORDER — SODIUM CHLORIDE 1 G PO TABS
2.0000 g | ORAL_TABLET | Freq: Once | ORAL | Status: AC
  Administered 2020-03-28: 2 g via ORAL
  Filled 2020-03-28: qty 2

## 2020-03-28 NOTE — Progress Notes (Signed)
[facility] Daily Progress Note   Subjective: 03/24/20: Thrombectomy of right femoral to posterior tibial artery bypass graft  03/23/20: 1. rightprofundafemoral artery to right posteriortibial artery bypass with 6 mm distal flow graft 2. Thromboembolectomy right posterior tibial artery with a #2 Fogarty  03/23/20: 1. Introduction catheter intorightlower extremity 3rd order catheter placement  2.Contrast injectionrightlower extremity for distal runoff  3. Percutaneous transluminal angioplastyright femoral-tibial bypass to 6 mm 4. Mechanical thrombectomy with the penumbra CAT 7 device right femoral to tibial bypass 5.Starclose closure leftcommon femoral arteriotomy  Patient without new complaint. Still some right lower extremity discomfort. No issues overnight.   Objective: Vitals:   03/27/20 1022 03/27/20 2042 03/28/20 0335 03/28/20 1147  BP: (!) 152/73 (!) 121/58 130/64 126/67  Pulse: 87 87 88 81  Resp: 16 16 16 16   Temp: 98.1 F (36.7 C) 98.2 F (36.8 C) 97.9 F (36.6 C) 97.7 F (36.5 C)  TempSrc: Oral Oral Oral Oral  SpO2: 100% 100% 100% 98%  Weight:      Height:        Intake/Output Summary (Last 24 hours) at 03/28/2020 1235 Last data filed at 03/28/2020 1100 Gross per 24 hour  Intake 1386.03 ml  Output 2900 ml  Net -1513.97 ml   Physical Exam: A&Ox3, NAD CV: RRR Pulmonary: CTA Bilaterally Abdomen: Soft, Nontender, Nondistended Vascular:             Right Lower Extremity: Thigh soft.  Calf soft.  Extremities moderately minutes.  Warm distally to toes.  Motor / sensory intact.             Incisions: Staples clean dry and intact.   Laboratory: CBC    Component Value Date/Time   WBC 11.8 (H) 03/28/2020 0421   HGB 10.7 (L) 03/28/2020 0421   HGB 13.3 04/13/2019 1002   HCT 31.5 (L) 03/28/2020 0421   HCT 39.8 04/13/2019 1002   PLT 240 03/28/2020 0421   PLT 346  04/13/2019 1002   BMET    Component Value Date/Time   NA 133 (L) 03/28/2020 0421   NA 141 10/14/2019 1101   K 4.1 03/28/2020 0421   CL 101 03/28/2020 0421   CO2 28 03/28/2020 0421   GLUCOSE 116 (H) 03/28/2020 0421   BUN 11 03/28/2020 0421   BUN 15 10/14/2019 1101   BUN 16 07/03/2014 1205   CREATININE 0.76 03/28/2020 0421   CREATININE 1.34 (H) 07/03/2014 1205   CALCIUM 8.2 (L) 03/28/2020 0421   GFRNONAA >60 03/28/2020 0421   GFRNONAA 59 (L) 07/03/2014 1205   GFRAA >60 03/28/2020 0421   GFRAA >60 07/03/2014 1205   Assessment/Planning: The patient is a 64 year old male multiple medical issues including 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  Staples are clean dry and intact however the leg is edematous will paint with Betadine and apply gauze dressing 3 times a day  2) Transitioned from heparin gtt to Eliquis, ASA and Plavix.  3) Ambulation, physical therapy and occupational Therapy  4) Leukocytosis - CBC in a.m. WBC stable, encouraged OOB / IS   5) Constipation - will order bowel regiment  Discussed with Dr. Eber Hong Harla Mensch PA-C 03/28/2020 12:35 PM

## 2020-03-28 NOTE — Progress Notes (Signed)
Bell for Heparin Indication: occluded right femoral posterior tibial bypass graft, s/p thrombectomy  No Known Allergies  Patient Measurements: Height: 6' (182.9 cm) Weight: 93.9 kg (207 lb) IBW/kg (Calculated) : 77.6 HEPARIN DW (KG): 93.9  Vital Signs: Temp: 97.9 F (36.6 C) (09/08 0335) Temp Source: Oral (09/08 0335) BP: 130/64 (09/08 0335) Pulse Rate: 88 (09/08 0335)  Labs: Recent Labs    03/26/20 0415 03/26/20 0415 03/27/20 0450 03/28/20 0421  HGB 10.1*   < > 10.5* 10.7*  HCT 31.0*  --  32.5* 31.5*  PLT 195  --  222 240  HEPARINUNFRC 0.62  --  0.31 0.25*  CREATININE  --   --   --  0.76   < > = values in this interval not displayed.    Estimated Creatinine Clearance: 115.3 mL/min (by C-G formula based on SCr of 0.76 mg/dL).   Medical History: Past Medical History:  Diagnosis Date  . Allergy   . Dyspnea    former smoker. only doe  . History of kidney stones   . Peripheral vascular disease (HCC)     Medications:  Scheduled:  . aspirin EC  81 mg Oral Daily  . Chlorhexidine Gluconate Cloth  6 each Topical Daily  . clopidogrel  75 mg Oral Q0600  . docusate sodium  100 mg Oral Daily  . gabapentin  300 mg Oral QHS  . pantoprazole  40 mg Oral Daily  . simvastatin  20 mg Oral q1800   Assessment: 64 y.o. male presenting with occluded R femoral posterior tibial bypass graft now s/p thrombectomy. Pharmacy has been consulted for IV heparin dosing for full anticoagulation with NO BOLUS.  Today, initial heparin level is in therapeutic range at 0.56 after starting full anticoagulation last night when cleared by vascular surgery (previously on heparin 500 units/hr with no titration). Hgb down somewhat s/p procedure and platelets WNL. Last SCr stable from previous. No issues with the infusion or overt bleeding noted, confirmed with pt's RN.  Goal of Therapy:  Heparin level 0.3-0.7 units/ml Monitor platelets by anticoagulation  protocol: Yes  Plan:  09/08 @ 0500 HL 0.25 subtherapeutic. Will increase rate to 1400 units/hr and will recheck HL at 1100 and continue to monitor.  Tobie Lords, PharmD, BCPS Clinical Pharmacist  03/28/2020   5:22 AM

## 2020-03-28 NOTE — Progress Notes (Signed)
Physical Therapy Treatment Patient Details Name: Joshua [name] MRN: [id number] DOB: 1955-11-12 Today's Date: 03/28/2020    History of Present Illness Pt is a 64 y/o M who was adm for acute hospitalization s/p R LE angioplasty, mechanical thrombectomy (9/3), unable to resolve clot; s/p R profunda femoral artery to R tibial artery bypass and thrombectomy (9/3); return to OR for additional thrombectomy to right femoral posterior tibial bypass graft (9/4).  Recent PMH significant for ADCF (8/25), aspen collar to be worn when OOB.    PT Comments    Pt was long sitting in bed upon arriving. He agrees to PT session and is cooperative. He requested to attempt to have BM. Unsuccessful but did pass gas. Continues to be limited by pain however was able to ambulate in room with RW with very limited wt bearing on RLE. No LOB or unsteadiness throughout session. Recommend DC to home when medically stable. Acute PT will continue to follow per POC. He was in recliner with call bell in reach, chair alarm set, and RN aware of pt's abilities, post session.     Follow Up Recommendations  Home health PT     Equipment Recommendations  Rolling walker with 5" wheels    Recommendations for Other Services       Precautions / Restrictions Precautions Precautions: Cervical;Fall Required Braces or Orthoses: Cervical Brace (when ambulating) Restrictions Weight Bearing Restrictions: No    Mobility  Bed Mobility Overal bed mobility: Needs Assistance Bed Mobility: Supine to Sit;Sit to Supine     Supine to sit: Supervision;HOB elevated Sit to supine:  (pt was sitting in recliner post session)   General bed mobility comments: Pt required increased time and vcs for improved technique. did perform without physical assistance.  Transfers Overall transfer level: Needs assistance Equipment used: 4-wheeled walker Transfers: Sit to/from Stand Sit to Stand: Min guard         General transfer comment: CGA  for safety to STS from EOB, BSC and recliner. pt attempted to have BM but was unsuccessful. Vcs for handplacement and improved safety. pt has poor safety awareness however did not required lifting assistance to stand. CGA for safety  Ambulation/Gait Ambulation/Gait assistance: Min guard Gait Distance (Feet): 25 Feet Assistive device: Rolling walker (2 wheeled) Gait Pattern/deviations: Step-to pattern;Trunk flexed Gait velocity: decreased    General Gait Details: pt was able to tolerate minimal wt on RLE today. pain is improved but continues to limit. He was able to ambulated around room 25 ft without LOB or unsteadiness. will continue to promote increased wt bearing on RLE as pt able to tolerate          Cognition Arousal/Alertness: Awake/alert Behavior During Therapy: WFL for tasks assessed/performed Overall Cognitive Status: Within Functional Limits for tasks assessed            General Comments: Pt is A and O and agreeable to PT session              Pertinent Vitals/Pain Pain Assessment: 0-10 Pain Score: 8  Faces Pain Scale: Hurts whole lot Pain Location: R LE Pain Descriptors / Indicators: Discomfort;Guarding Pain Intervention(s): Limited activity within patient's tolerance;Monitored during session;Premedicated before session;Repositioned           PT Goals (current goals can now be found in the care plan section) Acute Rehab PT Goals Patient Stated Goal: to go home Progress towards PT goals: Progressing toward goals    Frequency    Min 2X/week  PT Plan Current plan remains appropriate    Co-evaluation              AM-PAC PT "6 Clicks" Mobility   Outcome Measure  Help needed turning from your back to your side while in a flat bed without using bedrails?: A Little Help needed moving from lying on your back to sitting on the side of a flat bed without using bedrails?: A Little Help needed moving to and from a bed to a chair (including a  wheelchair)?: A Little Help needed standing up from a chair using your arms (e.g., wheelchair or bedside chair)?: A Little Help needed to walk in hospital room?: A Little Help needed climbing 3-5 steps with a railing? : A Little 6 Click Score: 18    End of Session Equipment Utilized During Treatment: Gait belt Activity Tolerance: Patient limited by pain Patient left: in chair;with call bell/phone within reach;with chair alarm set Nurse Communication: Mobility status PT Visit Diagnosis: Difficulty in walking, not elsewhere classified (R26.2);Muscle weakness (generalized) (M62.81);Pain Pain - Right/Left: Right Pain - part of body: Leg     Time: 9622-2979 PT Time Calculation (min) (ACUTE ONLY): 28 min  Charges:  $Gait Training: 8-22 mins $Therapeutic Activity: 8-22 mins                     Julaine Fusi PTA 03/28/20, 4:52 PM

## 2020-03-29 LAB — BASIC METABOLIC PANEL
Anion gap: 8 (ref 5–15)
BUN: 12 mg/dL (ref 8–23)
CO2: 28 mmol/L (ref 22–32)
Calcium: 8.4 mg/dL — ABNORMAL LOW (ref 8.9–10.3)
Chloride: 103 mmol/L (ref 98–111)
Creatinine, Ser: 0.85 mg/dL (ref 0.61–1.24)
GFR calc Af Amer: >60 mL/min (ref >60)
GFR calc non Af Amer: >60 mL/min (ref >60)
Glucose, Bld: 101 mg/dL — ABNORMAL HIGH (ref 70–99)
Potassium: 4 mmol/L (ref 3.5–5.1)
Sodium: 139 mmol/L (ref 135–145)

## 2020-03-29 LAB — HEPARIN INDUCED PLATELET AB (HIT ANTIBODY): Heparin Induced Plt Ab: 0.099 OD (ref 0.000–0.400)

## 2020-03-29 LAB — CBC
HCT: 29 % — ABNORMAL LOW (ref 39.0–52.0)
Hemoglobin: 9.7 g/dL — ABNORMAL LOW (ref 13.0–17.0)
MCH: 26.9 pg (ref 26.0–34.0)
MCHC: 33.4 g/dL (ref 30.0–36.0)
MCV: 80.3 fL (ref 80.0–100.0)
Platelets: 243 10*3/uL (ref 150–400)
RBC: 3.61 MIL/uL — ABNORMAL LOW (ref 4.22–5.81)
RDW: 15.6 % — ABNORMAL HIGH (ref 11.5–15.5)
WBC: 8.9 10*3/uL (ref 4.0–10.5)
nRBC: 0 % (ref 0.0–0.2)

## 2020-03-29 LAB — MAGNESIUM: Magnesium: 2.3 mg/dL (ref 1.7–2.4)

## 2020-03-29 NOTE — Progress Notes (Addendum)
Physical Therapy Treatment Patient Details Name: Joshua [name] MRN: [id number] DOB: 1956-07-06 Today's Date: 03/29/2020    History of Present Illness Pt is a 64 y/o M who was adm for acute hospitalization s/p R LE angioplasty, mechanical thrombectomy (9/3), unable to resolve clot; s/p R profunda femoral artery to R tibial artery bypass and thrombectomy (9/3); return to OR for additional thrombectomy to right femoral posterior tibial bypass graft (9/4).  Recent PMH significant for ADCF (8/25), aspen collar to be worn when OOB.    PT Comments    OOB with no assist and stood with min guard.  Is able to complete 3 laps in room with supervision and no LOB today despite multiple turns.  Limited by general fatigue but overall progressing well.  Stated he has all equipment needed at home and voices no further mobility concerns for discharge home.  He does wear cervical collar during mobility. He has a ramp to access his home.   Follow Up Recommendations  Home health PT     Equipment Recommendations  Rolling walker with 5" wheels    Recommendations for Other Services       Precautions / Restrictions Precautions Precautions: Cervical;Fall Required Braces or Orthoses: Cervical Brace Restrictions Weight Bearing Restrictions: Yes Other Position/Activity Restrictions: No bending, lifting >10#, twisting, arching cervical region    Mobility  Bed Mobility Overal bed mobility: Modified Independent                Transfers Overall transfer level: Needs assistance Equipment used: Rolling walker (2 wheeled) Transfers: Sit to/from Stand Sit to Stand: Supervision            Ambulation/Gait Ambulation/Gait assistance: Min guard Gait Distance (Feet): 60 Feet Assistive device: Rolling walker (2 wheeled) Gait Pattern/deviations: Step-to pattern;Trunk flexed Gait velocity: decreased    General Gait Details: uses RW to offload RLE with gait   Stairs              Wheelchair Mobility    Modified Rankin (Stroke Patients Only)       Balance Overall balance assessment: Needs assistance Sitting-balance support: No upper extremity supported;Feet supported Sitting balance-Leahy Scale: Good     Standing balance support: Bilateral upper extremity supported Standing balance-Leahy Scale: Fair Standing balance comment: reliant on RW for support                            Cognition Arousal/Alertness: Awake/alert Behavior During Therapy: WFL for tasks assessed/performed Overall Cognitive Status: Within Functional Limits for tasks assessed                                 General Comments: Pt is A and O and agreeable to PT session       Exercises      General Comments        Pertinent Vitals/Pain Pain Assessment: Faces Faces Pain Scale: Hurts a little bit Pain Location: R LE Pain Descriptors / Indicators: Discomfort;Guarding Pain Intervention(s): Monitored during session;Limited activity within patient's tolerance    Home Living                      Prior Function            PT Goals (current goals can now be found in the care plan section) Progress towards PT goals: Progressing toward goals    Frequency  Min 2X/week      PT Plan Current plan remains appropriate    Co-evaluation              AM-PAC PT "6 Clicks" Mobility   Outcome Measure  Help needed turning from your back to your side while in a flat bed without using bedrails?: None Help needed moving from lying on your back to sitting on the side of a flat bed without using bedrails?: None Help needed moving to and from a bed to a chair (including a wheelchair)?: A Little Help needed standing up from a chair using your arms (e.g., wheelchair or bedside chair)?: A Little Help needed to walk in hospital room?: A Little Help needed climbing 3-5 steps with a railing? : A Little 6 Click Score: 20    End of Session  Equipment Utilized During Treatment: Gait belt Activity Tolerance: Patient limited by pain Patient left: in chair;with call bell/phone within reach;with chair alarm set Nurse Communication: Mobility status PT Visit Diagnosis: Difficulty in walking, not elsewhere classified (R26.2);Muscle weakness (generalized) (M62.81);Pain Pain - Right/Left: Right Pain - part of body: Leg     Time: 1221-1231 PT Time Calculation (min) (ACUTE ONLY): 10 min  Charges:  $Gait Training: 8-22 mins                    Chesley Noon, PTA 03/29/20, 12:41 PM

## 2020-03-29 NOTE — Progress Notes (Signed)
 Vein & Vascular Surgery Daily Progress Note  Subjective: 03/24/20: Thrombectomy of right femoral to posterior tibial artery bypass graft  03/23/20: 1. rightprofundafemoral artery to right posteriortibial artery bypass with 6 mm distal flow graft 2. Thromboembolectomy right posterior tibial artery with a #2 Fogarty  03/23/20: 1. Introduction catheter intorightlower extremity 3rd order catheter placement  2.Contrast injectionrightlower extremity for distal runoff  3. Percutaneous transluminal angioplastyright femoral-tibial bypass to 6 mm 4. Mechanical thrombectomy with the penumbra CAT 7 device right femoral to tibial bypass 5.Starclose closure leftcommon femoral arteriotomy  Patient without new complaint. Still some right lower extremity discomfort. No issues overnight. Continues to work with PT.  Objective: Vitals:   03/28/20 0335 03/28/20 1147 03/28/20 2019 03/29/20 0524  BP: 130/64 126/67 (!) 123/57 122/60  Pulse: 88 81 85 78  Resp: 16 16 20 16   Temp: 97.9 F (36.6 C) 97.7 F (36.5 C) 98 F (36.7 C) 98 F (36.7 C)  TempSrc: Oral Oral Oral Oral  SpO2: 100% 98% 99% 97%  Weight:      Height:        Intake/Output Summary (Last 24 hours) at 03/29/2020 1138 Last data filed at 03/29/2020 1029 Gross per 24 hour  Intake 840 ml  Output 2100 ml  Net -1260 ml   Physical Exam: A&Ox3, NAD CV: RRR Pulmonary: CTA Bilaterally Abdomen: Soft, Nontender, Nondistended Vascular: Right Lower Extremity:Thigh soft. Calf soft. Extremities moderately minutes. Warm distally to toes. Motor / sensoryintact. Incisions:Staples clean dry and intact.   Laboratory: CBC    Component Value Date/Time   WBC 8.9 03/29/2020 0444   HGB 9.7 (L) 03/29/2020 0444   HGB 13.3 04/13/2019 1002   HCT 29.0 (L) 03/29/2020 0444   HCT 39.8 04/13/2019 1002   PLT 243 03/29/2020 0444   PLT 346  04/13/2019 1002   BMET    Component Value Date/Time   NA 139 03/29/2020 0444   NA 141 10/14/2019 1101   K 4.0 03/29/2020 0444   CL 103 03/29/2020 0444   CO2 28 03/29/2020 0444   GLUCOSE 101 (H) 03/29/2020 0444   BUN 12 03/29/2020 0444   BUN 15 10/14/2019 1101   BUN 16 07/03/2014 1205   CREATININE 0.85 03/29/2020 0444   CREATININE 1.34 (H) 07/03/2014 1205   CALCIUM 8.4 (L) 03/29/2020 0444   GFRNONAA >60 03/29/2020 0444   GFRNONAA 59 (L) 07/03/2014 1205   GFRAA >60 03/29/2020 0444   GFRAA >60 07/03/2014 1205   Assessment/Planning: The patient is a 64 year old male multiple medical issues including 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  3)Ambulation, physical therapy andoccupational Therapy  4)Leukocytosis-Improved  Discussed with Dr. Eber Hong Raegyn Renda PA-C 03/29/2020 11:38 AM

## 2020-03-29 NOTE — Progress Notes (Signed)
Occupational Therapy Treatment Patient Details Name: Joshua [name] MRN: [id number] DOB: 12-Jul-1956 Today's Date: 03/29/2020    History of present illness Pt is a 64 y/o M who was adm for acute hospitalization s/p R LE angioplasty, mechanical thrombectomy (9/3), unable to resolve clot; s/p R profunda femoral artery to R tibial artery bypass and thrombectomy (9/3); return to OR for additional thrombectomy to right femoral posterior tibial bypass graft (9/4).  Recent PMH significant for ADCF (8/25), aspen collar to be worn when OOB.   OT comments  Pt making significant progress this session. Supervision for bed mobility and donning aspen collar EOB independently. Pt standing with supervision and ambulating with min guard progressing to supervision to sink and standing for 6 minutes for grooming tasks at supervision level. Pt returning to sit on EOB for rest break. OT discussed home set up in regards to energy conservation and safety as well as use of AD for ambulating in kitchen at home. OT discussed use of counters and kitchen island to safely move items around in kitchen when preparing food for safety with kitchen mobility. Pt verbalized understanding and reports feeling prepared for discharge home. OT continues to recommend HHOT at discharge to address functional deficits and safety.  Follow Up Recommendations  Home health OT    Equipment Recommendations  Tub/shower seat       Precautions / Restrictions Precautions Precautions: Cervical;Fall Required Braces or Orthoses: Cervical Brace Restrictions Weight Bearing Restrictions: Yes Other Position/Activity Restrictions: No bending, lifting >10#, twisting, arching cervical region       Mobility Bed Mobility Overal bed mobility: Needs Assistance Bed Mobility: Supine to Sit;Sit to Supine     Supine to sit: Supervision;HOB elevated Sit to supine: Supervision   General bed mobility comments: increased time and min cuing for  technique  Transfers Overall transfer level: Needs assistance Equipment used: Rolling walker (2 wheeled) Transfers: Sit to/from Stand Sit to Stand: Supervision              Balance Overall balance assessment: Needs assistance Sitting-balance support: No upper extremity supported;Feet supported Sitting balance-Leahy Scale: Good     Standing balance support: Bilateral upper extremity supported Standing balance-Leahy Scale: Fair Standing balance comment: reliant on RW for support          ADL either performed or assessed with clinical judgement   ADL Overall ADL's : Needs assistance/impaired     Grooming: Wash/dry hands;Wash/dry face;Oral care;Standing         General ADL Comments: min guard for functional ambulation within the room and close supervision for standing balance at sink for grooming tasks               Cognition Arousal/Alertness: Awake/alert Behavior During Therapy: WFL for tasks assessed/performed Overall Cognitive Status: Within Functional Limits for tasks assessed         General Comments: Pt is A and O and agreeable to PT session                    Pertinent Vitals/ Pain       Pain Assessment: Faces Faces Pain Scale: Hurts a little bit Pain Location: R LE Pain Descriptors / Indicators: Discomfort;Guarding Pain Intervention(s): Monitored during session;Repositioned         Frequency  Min 2X/week        Progress Toward Goals  OT Goals(current goals can now be found in the care plan section)  Progress towards OT goals: Progressing toward goals  Acute Rehab OT  Goals Patient Stated Goal: to go home OT Goal Formulation: With patient Time For Goal Achievement: 04/08/20 Potential to Achieve Goals: Good  Plan Discharge plan remains appropriate       AM-PAC OT "6 Clicks" Daily Activity     Outcome Measure   Help from another person eating meals?: None Help from another person taking care of personal grooming?: A  Little Help from another person toileting, which includes using toliet, bedpan, or urinal?: A Little Help from another person bathing (including washing, rinsing, drying)?: A Little Help from another person to put on and taking off regular upper body clothing?: A Little Help from another person to put on and taking off regular lower body clothing?: A Little 6 Click Score: 19    End of Session    OT Visit Diagnosis: Unsteadiness on feet (R26.81);Muscle weakness (generalized) (M62.81)   Activity Tolerance Patient tolerated treatment well   Patient Left in bed;with call bell/phone within reach   Nurse Communication          Time: 3014-9969 OT Time Calculation (min): 44 min  Charges: OT General Charges $OT Visit: 1 Visit OT Treatments $Self Care/Home Management : 38-52 mins  Darleen Crocker, MS, OTR/L , CBIS ascom [phone]  03/29/20, 4:16 PM

## 2020-03-30 LAB — BASIC METABOLIC PANEL
Anion gap: 10 (ref 5–15)
BUN: 12 mg/dL (ref 8–23)
CO2: 28 mmol/L (ref 22–32)
Calcium: 8.6 mg/dL — ABNORMAL LOW (ref 8.9–10.3)
Chloride: 102 mmol/L (ref 98–111)
Creatinine, Ser: 0.76 mg/dL (ref 0.61–1.24)
GFR calc Af Amer: >60 mL/min (ref >60)
GFR calc non Af Amer: >60 mL/min (ref >60)
Glucose, Bld: 99 mg/dL (ref 70–99)
Potassium: 4.1 mmol/L (ref 3.5–5.1)
Sodium: 140 mmol/L (ref 135–145)

## 2020-03-30 LAB — CBC
HCT: 28.2 % — ABNORMAL LOW (ref 39.0–52.0)
Hemoglobin: 9.8 g/dL — ABNORMAL LOW (ref 13.0–17.0)
MCH: 26.9 pg (ref 26.0–34.0)
MCHC: 34.8 g/dL (ref 30.0–36.0)
MCV: 77.5 fL — ABNORMAL LOW (ref 80.0–100.0)
Platelets: 295 10*3/uL (ref 150–400)
RBC: 3.64 MIL/uL — ABNORMAL LOW (ref 4.22–5.81)
RDW: 15.9 % — ABNORMAL HIGH (ref 11.5–15.5)
WBC: 9.3 10*3/uL (ref 4.0–10.5)
nRBC: 0 % (ref 0.0–0.2)

## 2020-03-30 LAB — MAGNESIUM: Magnesium: 2.4 mg/dL (ref 1.7–2.4)

## 2020-03-30 NOTE — Progress Notes (Signed)
Occupational Therapy Treatment Patient Details Name: Joshua [name] MRN: [id number] DOB: Nov 02, 1955 Today's Date: 03/30/2020    History of present illness Pt is a 64 y/o M who was adm for acute hospitalization s/p R LE angioplasty, mechanical thrombectomy (9/3), unable to resolve clot; s/p R profunda femoral artery to R tibial artery bypass and thrombectomy (9/3); return to OR for additional thrombectomy to right femoral posterior tibial bypass graft (9/4).  Recent PMH significant for ADCF (8/25), aspen collar to be worn when OOB.   OT comments  Pt seen for OT tx this am. Pt pleasant and agreeable. Pt performed bed mobility with supervision for safety. Applied Aspen cervical collar independently. Close supervision for STS transfers and ambulating to sink with RW. Pt tolerated standing at sink >10 min for standing grooming tasks and UB bathing. Cues for minimizing bending over sink to spit after brushing teeth by using 2 cups. Pt demo'd understanding with improved posture during tasks. Pt instructed in conservation strategies during session to support return home while minimizing risk of over exertion and maximizing safety/independence. Pt instructed in RLE positioning to improve resting knee extension and minimize "tightness" with knee flexion at rest. Pt verbalized understanding and reported improved comfort with 2 pillows placed under RLE to float heels (not placed under knee). Pt continues to benefit from skilled OT services; continue to recommend [facility].   Follow Up Recommendations  Home health OT    Equipment Recommendations  Tub/shower seat    Recommendations for Other Services      Precautions / Restrictions Precautions Precautions: Cervical;Fall Required Braces or Orthoses: Cervical Brace Cervical Brace:  (when OOB) Restrictions Weight Bearing Restrictions: No Other Position/Activity Restrictions: No bending, lifting >10#, twisting, arching cervical region       Mobility  Bed Mobility Overal bed mobility: Needs Assistance Bed Mobility: Supine to Sit;Sit to Supine     Supine to sit: Supervision;HOB elevated Sit to supine: Supervision      Transfers Overall transfer level: Needs assistance Equipment used: Rolling walker (2 wheeled) Transfers: Sit to/from Stand Sit to Stand: Supervision              Balance Overall balance assessment: Needs assistance Sitting-balance support: No upper extremity supported;Feet supported Sitting balance-Leahy Scale: Good     Standing balance support: Bilateral upper extremity supported Standing balance-Leahy Scale: Fair Standing balance comment: reliant on RW for support                           ADL either performed or assessed with clinical judgement   ADL Overall ADL's : Needs assistance/impaired     Grooming: Wash/dry hands;Wash/dry face;Oral care;Standing;Supervision/safety   Upper Body Bathing: Standing;Supervision/ safety                                   Vision Patient Visual Report: No change from baseline     Perception     Praxis      Cognition Arousal/Alertness: Awake/alert Behavior During Therapy: WFL for tasks assessed/performed Overall Cognitive Status: Within Functional Limits for tasks assessed                                          Exercises Other Exercises Other Exercises: Pt indep with aspen collar mgt Other Exercises: Pt performed standing  grooming tasks and UB washing with supervision for safety at sink; denied increased RLE pain and fatigue   Shoulder Instructions       General Comments      Pertinent Vitals/ Pain       Pain Assessment: 0-10 Pain Score: 4  Pain Location: R LE Pain Descriptors / Indicators: Discomfort;Guarding Pain Intervention(s): Limited activity within patient's tolerance;Monitored during session;Repositioned  Home Living                                          Prior  Functioning/Environment              Frequency  Min 2X/week        Progress Toward Goals  OT Goals(current goals can now be found in the care plan section)  Progress towards OT goals: Progressing toward goals  Acute Rehab OT Goals Patient Stated Goal: to go home OT Goal Formulation: With patient Time For Goal Achievement: 04/08/20 Potential to Achieve Goals: Good  Plan Discharge plan remains appropriate;Frequency remains appropriate    Co-evaluation                 AM-PAC OT "6 Clicks" Daily Activity     Outcome Measure   Help from another person eating meals?: None Help from another person taking care of personal grooming?: A Little Help from another person toileting, which includes using toliet, bedpan, or urinal?: A Little Help from another person bathing (including washing, rinsing, drying)?: A Little Help from another person to put on and taking off regular upper body clothing?: None Help from another person to put on and taking off regular lower body clothing?: A Little 6 Click Score: 20    End of Session Equipment Utilized During Treatment: Gait belt  OT Visit Diagnosis: Unsteadiness on feet (R26.81);Muscle weakness (generalized) (M62.81)   Activity Tolerance Patient tolerated treatment well   Patient Left in bed;with call bell/phone within reach;with bed alarm set;Other (comment) (RLE elevated)   Nurse Communication          Time: 1638-4536 OT Time Calculation (min): 24 min  Charges: OT General Charges $OT Visit: 1 Visit OT Treatments $Self Care/Home Management : 23-37 mins  Jeni Salles, MPH, MS, OTR/L ascom [phone] 03/30/20, 11:48 AM

## 2020-03-30 NOTE — Progress Notes (Signed)
[facility] Daily Progress Note   Subjective: 03/24/20: Thrombectomy of right femoral to posterior tibial artery bypass graft  03/23/20: 1. rightprofundafemoral artery to right posteriortibial artery bypass with 6 mm distal flow graft 2. Thromboembolectomy right posterior tibial artery with a #2 Fogarty  03/23/20: 1. Introduction catheter intorightlower extremity 3rd order catheter placement  2.Contrast injectionrightlower extremity for distal runoff  3. Percutaneous transluminal angioplastyright femoral-tibial bypass to 6 mm 4. Mechanical thrombectomy with the penumbra CAT 7 device right femoral to tibial bypass 5.Starclose closure leftcommon femoral arteriotomy  Patient without complaint, some continued right lower extremity discomfort.   Objective: Vitals:   03/29/20 1202 03/29/20 2006 03/30/20 0508 03/30/20 1145  BP: 140/66 (!) 142/63 127/65 134/67  Pulse: 95 90 76 82  Resp:  16 16   Temp: (!) 97.5 F (36.4 C) 98.4 F (36.9 C) 98.3 F (36.8 C) 97.8 F (36.6 C)  TempSrc: Oral Oral Oral Oral  SpO2: 97% 98% 98% 98%  Weight:      Height:        Intake/Output Summary (Last 24 hours) at 03/30/2020 1323 Last data filed at 03/30/2020 2951 Gross per 24 hour  Intake 720 ml  Output 2050 ml  Net -1330 ml   Physical Exam: A&Ox3, NAD CV: RRR Pulmonary: CTA Bilaterally Abdomen: Soft, Nontender, Nondistended Vascular: Right Lower Extremity:Thigh soft. Calf soft. Extremities moderately minutes. Warm distally to toes. Motor / sensoryintact. Incisions:Staples clean dry and intact.   Laboratory: CBC    Component Value Date/Time   WBC 9.3 03/30/2020 0536   HGB 9.8 (L) 03/30/2020 0536   HGB 13.3 04/13/2019 1002   HCT 28.2 (L) 03/30/2020 0536   HCT 39.8 04/13/2019 1002   PLT 295 03/30/2020 0536   PLT 346 04/13/2019 1002   BMET    Component Value  Date/Time   NA 140 03/30/2020 0536   NA 141 10/14/2019 1101   K 4.1 03/30/2020 0536   CL 102 03/30/2020 0536   CO2 28 03/30/2020 0536   GLUCOSE 99 03/30/2020 0536   BUN 12 03/30/2020 0536   BUN 15 10/14/2019 1101   BUN 16 07/03/2014 1205   CREATININE 0.76 03/30/2020 0536   CREATININE 1.34 (H) 07/03/2014 1205   CALCIUM 8.6 (L) 03/30/2020 0536   GFRNONAA >60 03/30/2020 0536   GFRNONAA 59 (L) 07/03/2014 1205   GFRAA >60 03/30/2020 0536   GFRAA >60 07/03/2014 1205   Assessment/Planning: The patient is a 64 year old male multiple medical issues including 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  3)Ambulation, physical therapy andoccupational Therapy  4)Plan for discharge home tomorrow. Will need home PT, OT, nursing. Face to face submitted today.  Discussedwith Dr. Eber Hong Aleathia Purdy PA-C 03/30/2020 1:23 PM

## 2020-03-30 NOTE — Discharge Instructions (Signed)
You may shower. Keep incisions clean and dry.  Clean incision with soap and water. Gently pat dry. Bedatine to incisions once a day. Cover with guaze. No heavy lifting greater than 10 pounds for at least three weeks

## 2020-03-30 NOTE — TOC Initial Note (Signed)
Transition of Care [facility]) - Initial/Assessment Note    Patient Details  Name: Joshua [name] MRN: [id number] Date of Birth: 1956-03-19  [facility] CM/SW Contact:    Beverly Sessions, RN Phone Number: 03/30/2020, 3:24 PM  Clinical Narrative:                 Patient from home alone States that his 65 lives locally and will be transporting to appointments at discharge  Patient states he has RW, cane,WC, BSC, shower seat, and rollator at home.  Patient open with [facility] for PT.  Will need RN and PT at discharge.  Corene Cornea with [facility] aware  Expected Discharge Plan: [place] Barriers to Discharge: Continued Medical Work up   Patient Goals and CMS Choice        Expected Discharge Plan and Services Expected Discharge Plan: [place]   Discharge Planning Services: CM Consult   Living arrangements for the past 2 months: Single Family Home                           HH Arranged: RN, PT [facility] Agency: [facility] ([facility]) Date HH Agency Contacted: 03/30/20   Representative spoke with at [facility]: Corene Cornea  Prior Living Arrangements/Services Living arrangements for the past 2 months: [place] Lives with:: Self   Do you feel safe going back to the place where you live?: Yes          Current home services: DME, Home PT    Activities of Daily Living Home Assistive Devices/Equipment: Cane (specify quad or straight) ADL Screening (condition at time of admission) Patient's cognitive ability adequate to safely complete daily activities?: Yes Is the patient deaf or have difficulty hearing?: No Does the patient have difficulty seeing, even when wearing glasses/contacts?: No Does the patient have difficulty concentrating, remembering, or making decisions?: No Patient able to express need for assistance with ADLs?: Yes Does the patient have difficulty dressing or bathing?:  No Independently performs ADLs?: Yes (appropriate for developmental age) Does the patient have difficulty walking or climbing stairs?: No Weakness of Legs: None Weakness of Arms/Hands: None  Permission Sought/Granted                  Emotional Assessment       Orientation: : Oriented to Place, Oriented to Self, Oriented to  Time, Oriented to Situation      Admission diagnosis:  Ischemia of extremity [I99.8] Patient Active Problem List   Diagnosis Date Noted  . Ischemia of extremity 03/23/2020  . Cervical myelopathy ([place]) 03/14/2020  . Ischemia of right lower extremity 11/15/2019  . Grief 10/14/2019  . Peripheral neuropathy 10/14/2019  . Chronic left-sided low back pain without sciatica 04/13/2019  . History of back surgery 04/13/2019  . Overweight 04/13/2019  . Atherosclerosis of lower extremity with claudication ([place]) 11/18/2016  . Complication associated with vascular device 11/13/2016  . Pain in limb 08/14/2016  . Hyperlipidemia 06/18/2016  . Hypertension 06/18/2016  . Radiculopathy of lumbar region 06/10/2016  . PVD (peripheral vascular disease) ([place]) 05/08/2016  . 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 Prediabetes 08/25/2014   PCP:  Virginia Crews, MD Pharmacy:   [facility] [address] [place]  [place] Phone: [phone] Fax: [phone]  [facility], [place], [place] [address] [place], [place] [address] Phone: [phone] Fax: [phone]     Social Determinants of Health (SDOH) Interventions    Readmission Risk Interventions No flowsheet data found.

## 2020-03-30 NOTE — Care Management Important Message (Signed)
Important Message  Patient Details  Name: Joshua [name] MRN: [id number] Date of Birth: [birth year]   Medicare Important Message Given:  Yes     [name] 03/30/2020, 1:41 PM

## 2020-03-30 NOTE — Progress Notes (Signed)
POSTOPERATIVE F/U NOTE  REFERRING PHYSICIAN:  Ricard Dillon, [place],  [place] [id number]  Procedure:  ACDF C4-7 Date of procedure: 03/14/2020 Diagnosis: cervical myelopathy  HISTORY OF PRESENT ILLNESS: [name] is approximately 2 weeks status post C4-7 ACDF. Overall, he is doing quite well in regard to his cervical spine surgery. He endorses that his left shoulder pain has improved and prior to this current hospitalization for RLE ischemia, he had been able to move his R leg with increased ease. He also endorses decreased pain in the toes of his feet bilaterally.  He is pleased with his neck surgery.   He is currently admitted for treatment of his known RLE vascular concerns and therefore I am conducting his postoperative visit in the hospital.    General: Patient is well-developed, well-nourished, and in no apparent distress  Neurological:  Awake, alert, oriented to person, place, and time.  Pupils equal round and reactive to light.  Facial tone is symmetric. No pronator drift.   Strength: Side Biceps Triceps Deltoid Interossei Grip  R 5 5 5 5 5   L 5 5 5 5 5    Sensory: intact and symmetric to light touch in upper extremities  Skin: Incision site is healing well. No bleeding, erythema, edema, drainage, warmth, tenderness, fluctuance. No evidence of hematoma noted.   IMAGING: No imaging reviewed Assessment / Plan: [name] is doing well approx 2 weeks s/p ACDF C4-7. He will return to clinic for a follow up with Dr Izora Ribas.  I have advised the patient to lift up to 10 pounds until 6 weeks after surgery, then increase up to 25 pounds until 12 weeks after surgery.  After 12 weeks post-op, the patient advised to increase activity as tolerated.  Advised to contact the office if any questions or concerns arise.  This note was partially dictated using voice recognition software, so please excuse any errors that were not  corrected.  Lonell Face, NP

## 2020-03-31 MED ORDER — CLOPIDOGREL BISULFATE 75 MG PO TABS: 75.0000 mg | ORAL_TABLET | Freq: Every day | ORAL | 1 refills | Status: DC

## 2020-03-31 MED ORDER — APIXABAN 5 MG PO TABS
5.0000 mg | ORAL_TABLET | Freq: Two times a day (BID) | ORAL | 1 refills | Status: DC
Start: 2020-03-31 — End: 2020-06-28

## 2020-03-31 NOTE — Discharge Summary (Signed)
Physician Discharge Summary  Patient ID: Joshua [name] MRN: [id number] DOB/AGE: 62-Aug-1957 62 y.o.  Admit date: 03/23/2020 Discharge date: 03/31/2020  Admission Diagnoses:  Discharge Diagnoses:  Active Problems:   Ischemia of extremity   Discharged Condition: good  Hospital Course: 03/24/20: Thrombectomy of right femoral to posterior tibial artery bypass graft  03/23/20: 1. rightprofundafemoral artery to right posteriortibial artery bypass with 6 mm distal flow graft 2. Thromboembolectomy right posterior tibial artery with a #2 Fogarty  03/23/20: 1. Introduction catheter intorightlower extremity 3rd order catheter placement  2.Contrast injectionrightlower extremity for distal runoff  3. Percutaneous transluminal angioplastyright femoral-tibial bypass to 6 mm 4. Mechanical thrombectomy with the penumbra CAT 7 device right femoral to tibial bypass 5.Starclose closure leftcommon femoral arteriotomy  Consults: None  Significant Diagnostic Studies:   Treatments: surgery: 03/24/20: Thrombectomy of right femoral to posterior tibial artery bypass graft  03/23/20: 3. rightprofundafemoral artery to right posteriortibial artery bypass with 6 mm distal flow graft 4. Thromboembolectomy right posterior tibial artery with a #2 Fogarty  03/23/20: 1. Introduction catheter intorightlower extremity 3rd order catheter placement  2.Contrast injectionrightlower extremity for distal runoff  3. Percutaneous transluminal angioplastyright femoral-tibial bypass to 6 mm 4. Mechanical thrombectomy with the penumbra CAT 7 device right femoral to tibial bypass 5.Starclose closure leftcommon femoral arteriotomy  Discharge Exam: Blood pressure 128/61, pulse 76, temperature 98.7 F (37.1 C), temperature source Oral, resp. rate 17, height 6' (1.829 m),  weight 93.9 kg, SpO2 98 %. General appearance: alert and no distress Resp: clear to auscultation bilaterally Cardio: regular rate and rhythm Extremities: RIGHT- warm, minimal edema, incisions/staples- C/D/I, soft, warm to toes  Disposition: Discharge disposition: 01-Home or Self Care       Discharge Instructions    Call MD for:  redness, tenderness, or signs of infection (pain, swelling, bleeding, redness, odor or green/yellow discharge around incision site)   Complete by: As directed    Call MD for:  severe or increased pain, loss or decreased feeling  in affected limb(s)   Complete by: As directed    Call MD for:  temperature >100.5   Complete by: As directed    Discharge instructions   Complete by: As directed    Please call/return for increased pain in leg, redness, drainage from incisions.   Driving Restrictions   Complete by: As directed    No driving until follow up   Lifting restrictions   Complete by: As directed    No lifting for 4 weeks   No dressing needed   Complete by: As directed    Replace only if drainage present   Resume previous diet   Complete by: As directed        Follow-up Information    Schnier, Dolores Lory, MD Follow up in 2 week(s).   Specialties: Vascular Surgery, Cardiology, Radiology, Vascular Surgery Contact information: [place] [address] [phone]               Signed: Evaristo Bury 03/31/2020, 10:51 AM

## 2020-04-01 DIAGNOSIS — Z7902 Long term (current) use of antithrombotics/antiplatelets: Secondary | ICD-10-CM | POA: Diagnosis not present

## 2020-04-01 DIAGNOSIS — Z4789 Encounter for other orthopedic aftercare: Secondary | ICD-10-CM | POA: Diagnosis not present

## 2020-04-01 DIAGNOSIS — Z79891 Long term (current) use of opiate analgesic: Secondary | ICD-10-CM | POA: Diagnosis not present

## 2020-04-01 DIAGNOSIS — Z981 Arthrodesis status: Secondary | ICD-10-CM | POA: Diagnosis not present

## 2020-04-01 DIAGNOSIS — G959 Disease of spinal cord, unspecified: Secondary | ICD-10-CM | POA: Diagnosis not present

## 2020-04-01 DIAGNOSIS — I10 Essential (primary) hypertension: Secondary | ICD-10-CM | POA: Diagnosis not present

## 2020-04-01 DIAGNOSIS — Z9181 History of falling: Secondary | ICD-10-CM | POA: Diagnosis not present

## 2020-04-01 DIAGNOSIS — Z95820 Peripheral vascular angioplasty status with implants and grafts: Secondary | ICD-10-CM | POA: Diagnosis not present

## 2020-04-01 DIAGNOSIS — M4802 Spinal stenosis, cervical region: Secondary | ICD-10-CM | POA: Diagnosis not present

## 2020-04-01 DIAGNOSIS — I739 Peripheral vascular disease, unspecified: Secondary | ICD-10-CM | POA: Diagnosis not present

## 2020-04-03 DIAGNOSIS — I739 Peripheral vascular disease, unspecified: Secondary | ICD-10-CM | POA: Diagnosis not present

## 2020-04-03 DIAGNOSIS — Z79891 Long term (current) use of opiate analgesic: Secondary | ICD-10-CM | POA: Diagnosis not present

## 2020-04-03 DIAGNOSIS — Z981 Arthrodesis status: Secondary | ICD-10-CM | POA: Diagnosis not present

## 2020-04-03 DIAGNOSIS — I10 Essential (primary) hypertension: Secondary | ICD-10-CM | POA: Diagnosis not present

## 2020-04-03 DIAGNOSIS — Z95820 Peripheral vascular angioplasty status with implants and grafts: Secondary | ICD-10-CM | POA: Diagnosis not present

## 2020-04-03 DIAGNOSIS — Z7902 Long term (current) use of antithrombotics/antiplatelets: Secondary | ICD-10-CM | POA: Diagnosis not present

## 2020-04-03 DIAGNOSIS — G959 Disease of spinal cord, unspecified: Secondary | ICD-10-CM | POA: Diagnosis not present

## 2020-04-03 DIAGNOSIS — Z4789 Encounter for other orthopedic aftercare: Secondary | ICD-10-CM | POA: Diagnosis not present

## 2020-04-03 DIAGNOSIS — M4802 Spinal stenosis, cervical region: Secondary | ICD-10-CM | POA: Diagnosis not present

## 2020-04-03 DIAGNOSIS — Z9181 History of falling: Secondary | ICD-10-CM | POA: Diagnosis not present

## 2020-04-04 DIAGNOSIS — Z981 Arthrodesis status: Secondary | ICD-10-CM | POA: Diagnosis not present

## 2020-04-04 DIAGNOSIS — Z9181 History of falling: Secondary | ICD-10-CM | POA: Diagnosis not present

## 2020-04-04 DIAGNOSIS — Z7902 Long term (current) use of antithrombotics/antiplatelets: Secondary | ICD-10-CM | POA: Diagnosis not present

## 2020-04-04 DIAGNOSIS — G959 Disease of spinal cord, unspecified: Secondary | ICD-10-CM | POA: Diagnosis not present

## 2020-04-04 DIAGNOSIS — I739 Peripheral vascular disease, unspecified: Secondary | ICD-10-CM | POA: Diagnosis not present

## 2020-04-04 DIAGNOSIS — Z79891 Long term (current) use of opiate analgesic: Secondary | ICD-10-CM | POA: Diagnosis not present

## 2020-04-04 DIAGNOSIS — Z95820 Peripheral vascular angioplasty status with implants and grafts: Secondary | ICD-10-CM | POA: Diagnosis not present

## 2020-04-04 DIAGNOSIS — M4802 Spinal stenosis, cervical region: Secondary | ICD-10-CM | POA: Diagnosis not present

## 2020-04-04 DIAGNOSIS — Z4789 Encounter for other orthopedic aftercare: Secondary | ICD-10-CM | POA: Diagnosis not present

## 2020-04-04 DIAGNOSIS — I10 Essential (primary) hypertension: Secondary | ICD-10-CM | POA: Diagnosis not present

## 2020-04-05 ENCOUNTER — Telehealth: Payer: Self-pay | Admitting: Family Medicine

## 2020-04-05 NOTE — Telephone Encounter (Signed)
OK for verbals 

## 2020-04-05 NOTE — Telephone Encounter (Signed)
Home Health Verbal Orders - Caller/Agency: [facility] Number: [phone] Requesting OT/PT/Skilled Nursing/Social Work/Speech Therapy: Skilled nursing at home  Frequency:  1w1 2w1 1w5  for wound care and assessment with 2 PRNs

## 2020-04-06 NOTE — Telephone Encounter (Signed)
Christine advised.   Thanks,   -Mickel Baas

## 2020-04-09 ENCOUNTER — Other Ambulatory Visit (INDEPENDENT_AMBULATORY_CARE_PROVIDER_SITE_OTHER): Payer: Self-pay | Admitting: Nurse Practitioner

## 2020-04-09 ENCOUNTER — Encounter (INDEPENDENT_AMBULATORY_CARE_PROVIDER_SITE_OTHER): Payer: Medicare Other

## 2020-04-09 ENCOUNTER — Telehealth (INDEPENDENT_AMBULATORY_CARE_PROVIDER_SITE_OTHER): Payer: Self-pay | Admitting: Vascular Surgery

## 2020-04-09 ENCOUNTER — Ambulatory Visit (INDEPENDENT_AMBULATORY_CARE_PROVIDER_SITE_OTHER): Payer: Medicare Other | Admitting: Vascular Surgery

## 2020-04-09 DIAGNOSIS — Z95820 Peripheral vascular angioplasty status with implants and grafts: Secondary | ICD-10-CM | POA: Diagnosis not present

## 2020-04-09 DIAGNOSIS — Z7902 Long term (current) use of antithrombotics/antiplatelets: Secondary | ICD-10-CM | POA: Diagnosis not present

## 2020-04-09 DIAGNOSIS — G959 Disease of spinal cord, unspecified: Secondary | ICD-10-CM | POA: Diagnosis not present

## 2020-04-09 DIAGNOSIS — M4802 Spinal stenosis, cervical region: Secondary | ICD-10-CM | POA: Diagnosis not present

## 2020-04-09 DIAGNOSIS — I10 Essential (primary) hypertension: Secondary | ICD-10-CM | POA: Diagnosis not present

## 2020-04-09 DIAGNOSIS — I739 Peripheral vascular disease, unspecified: Secondary | ICD-10-CM | POA: Diagnosis not present

## 2020-04-09 DIAGNOSIS — Z4789 Encounter for other orthopedic aftercare: Secondary | ICD-10-CM | POA: Diagnosis not present

## 2020-04-09 DIAGNOSIS — Z79891 Long term (current) use of opiate analgesic: Secondary | ICD-10-CM | POA: Diagnosis not present

## 2020-04-09 DIAGNOSIS — Z9181 History of falling: Secondary | ICD-10-CM | POA: Diagnosis not present

## 2020-04-09 DIAGNOSIS — Z981 Arthrodesis status: Secondary | ICD-10-CM | POA: Diagnosis not present

## 2020-04-09 MED ORDER — OXYCODONE HCL 5 MG PO TABS
5.0000 mg | ORAL_TABLET | Freq: Four times a day (QID) | ORAL | 0 refills | Status: DC | PRN
Start: 2020-04-09 — End: 2020-09-14

## 2020-04-09 NOTE — Telephone Encounter (Signed)
Called stating that he needs a refill of oxycodone and if you could send it to the Pharmacy at [facility] on [address].

## 2020-04-09 NOTE — Telephone Encounter (Signed)
sent 

## 2020-04-11 DIAGNOSIS — I10 Essential (primary) hypertension: Secondary | ICD-10-CM | POA: Diagnosis not present

## 2020-04-11 DIAGNOSIS — Z4789 Encounter for other orthopedic aftercare: Secondary | ICD-10-CM | POA: Diagnosis not present

## 2020-04-11 DIAGNOSIS — I739 Peripheral vascular disease, unspecified: Secondary | ICD-10-CM | POA: Diagnosis not present

## 2020-04-11 DIAGNOSIS — G959 Disease of spinal cord, unspecified: Secondary | ICD-10-CM | POA: Diagnosis not present

## 2020-04-11 DIAGNOSIS — Z7902 Long term (current) use of antithrombotics/antiplatelets: Secondary | ICD-10-CM | POA: Diagnosis not present

## 2020-04-11 DIAGNOSIS — Z9181 History of falling: Secondary | ICD-10-CM | POA: Diagnosis not present

## 2020-04-11 DIAGNOSIS — M4802 Spinal stenosis, cervical region: Secondary | ICD-10-CM | POA: Diagnosis not present

## 2020-04-11 DIAGNOSIS — Z981 Arthrodesis status: Secondary | ICD-10-CM | POA: Diagnosis not present

## 2020-04-11 DIAGNOSIS — Z95820 Peripheral vascular angioplasty status with implants and grafts: Secondary | ICD-10-CM | POA: Diagnosis not present

## 2020-04-11 DIAGNOSIS — Z79891 Long term (current) use of opiate analgesic: Secondary | ICD-10-CM | POA: Diagnosis not present

## 2020-04-12 ENCOUNTER — Other Ambulatory Visit (INDEPENDENT_AMBULATORY_CARE_PROVIDER_SITE_OTHER): Payer: Self-pay | Admitting: Vascular Surgery

## 2020-04-12 ENCOUNTER — Other Ambulatory Visit: Payer: Self-pay | Admitting: Family Medicine

## 2020-04-12 DIAGNOSIS — I739 Peripheral vascular disease, unspecified: Secondary | ICD-10-CM | POA: Diagnosis not present

## 2020-04-12 DIAGNOSIS — I998 Other disorder of circulatory system: Secondary | ICD-10-CM

## 2020-04-12 DIAGNOSIS — Z9889 Other specified postprocedural states: Secondary | ICD-10-CM

## 2020-04-12 DIAGNOSIS — I10 Essential (primary) hypertension: Secondary | ICD-10-CM | POA: Diagnosis not present

## 2020-04-12 DIAGNOSIS — Z7902 Long term (current) use of antithrombotics/antiplatelets: Secondary | ICD-10-CM | POA: Diagnosis not present

## 2020-04-12 DIAGNOSIS — G959 Disease of spinal cord, unspecified: Secondary | ICD-10-CM | POA: Diagnosis not present

## 2020-04-12 DIAGNOSIS — Z95820 Peripheral vascular angioplasty status with implants and grafts: Secondary | ICD-10-CM | POA: Diagnosis not present

## 2020-04-12 DIAGNOSIS — Z4789 Encounter for other orthopedic aftercare: Secondary | ICD-10-CM | POA: Diagnosis not present

## 2020-04-12 DIAGNOSIS — Z79891 Long term (current) use of opiate analgesic: Secondary | ICD-10-CM | POA: Diagnosis not present

## 2020-04-12 DIAGNOSIS — M4802 Spinal stenosis, cervical region: Secondary | ICD-10-CM | POA: Diagnosis not present

## 2020-04-12 DIAGNOSIS — Z981 Arthrodesis status: Secondary | ICD-10-CM | POA: Diagnosis not present

## 2020-04-12 DIAGNOSIS — Z9181 History of falling: Secondary | ICD-10-CM | POA: Diagnosis not present

## 2020-04-12 NOTE — Telephone Encounter (Signed)
   Notes to clinic Pharmacy note states that the pt stated he no longer takes this, please assess

## 2020-04-16 ENCOUNTER — Other Ambulatory Visit: Payer: Self-pay

## 2020-04-16 ENCOUNTER — Ambulatory Visit (INDEPENDENT_AMBULATORY_CARE_PROVIDER_SITE_OTHER): Payer: Medicare Other

## 2020-04-16 ENCOUNTER — Ambulatory Visit (INDEPENDENT_AMBULATORY_CARE_PROVIDER_SITE_OTHER): Payer: Medicare Other | Admitting: Family Medicine

## 2020-04-16 ENCOUNTER — Encounter (INDEPENDENT_AMBULATORY_CARE_PROVIDER_SITE_OTHER): Payer: Self-pay

## 2020-04-16 ENCOUNTER — Ambulatory Visit (INDEPENDENT_AMBULATORY_CARE_PROVIDER_SITE_OTHER): Payer: Medicare Other | Admitting: Vascular Surgery

## 2020-04-16 ENCOUNTER — Encounter: Payer: Self-pay | Admitting: Family Medicine

## 2020-04-16 VITALS — BP 124/79 | HR 92 | Temp 97.8°F | Wt 202.0 lb

## 2020-04-16 DIAGNOSIS — I10 Essential (primary) hypertension: Secondary | ICD-10-CM

## 2020-04-16 DIAGNOSIS — I998 Other disorder of circulatory system: Secondary | ICD-10-CM

## 2020-04-16 DIAGNOSIS — Z9889 Other specified postprocedural states: Secondary | ICD-10-CM | POA: Diagnosis not present

## 2020-04-16 DIAGNOSIS — Z23 Encounter for immunization: Secondary | ICD-10-CM

## 2020-04-16 DIAGNOSIS — D649 Anemia, unspecified: Secondary | ICD-10-CM | POA: Diagnosis not present

## 2020-04-16 DIAGNOSIS — I739 Peripheral vascular disease, unspecified: Secondary | ICD-10-CM | POA: Diagnosis not present

## 2020-04-16 DIAGNOSIS — R7303 Prediabetes: Secondary | ICD-10-CM

## 2020-04-16 DIAGNOSIS — E782 Mixed hyperlipidemia: Secondary | ICD-10-CM | POA: Diagnosis not present

## 2020-04-16 DIAGNOSIS — E663 Overweight: Secondary | ICD-10-CM

## 2020-04-16 DIAGNOSIS — I70219 Atherosclerosis of native arteries of extremities with intermittent claudication, unspecified extremity: Secondary | ICD-10-CM | POA: Diagnosis not present

## 2020-04-16 NOTE — Assessment & Plan Note (Signed)
Previously well controlled Continue statin Repeat FLP and CMP Goal LDL < 70 given PVD

## 2020-04-16 NOTE — Assessment & Plan Note (Signed)
Encourage low carb diet Recheck A1c

## 2020-04-16 NOTE — Assessment & Plan Note (Signed)
Recent R fem-tib bypass Following with vascular surgery Needs good risk factor management Continue statin

## 2020-04-16 NOTE — Assessment & Plan Note (Signed)
S/p R fem-tib bypass Followed by [facility] Continue statin and antihypertensives

## 2020-04-16 NOTE — Assessment & Plan Note (Signed)
Well controlled Continue current medications Recheck metabolic panel F/u in 6 months  

## 2020-04-16 NOTE — Assessment & Plan Note (Signed)
Discussed importance of healthy weight management Discussed diet and exercise  

## 2020-04-16 NOTE — Progress Notes (Signed)
Established patient visit   Patient: Joshua [name]   DOB: April 18, 1956   64 y.o. Male  MRN: [id number] Visit Date: 04/16/2020  Today's healthcare provider: Lavon Paganini, MD   Chief Complaint  Patient presents with  . Hyperlipidemia  . Hypertension   Subjective    HPI  Hypertension, follow-up  BP Readings from Last 3 Encounters:  04/16/20 124/79  03/31/20 121/63  03/22/20 131/75   Wt Readings from Last 3 Encounters:  04/16/20 202 lb (91.6 kg)  03/23/20 207 lb (93.9 kg)  03/22/20 207 lb (93.9 kg)     He was last seen for hypertension 6 months ago.   He reports excellent compliance with treatment. He is not having side effects.  He is following a Regular diet. He is exercising. He does not smoke.  Use of agents associated with hypertension: none.   Outside blood pressures are not being check. Symptoms: No chest pain No chest pressure  No palpitations No syncope  No dyspnea No orthopnea  No paroxysmal nocturnal dyspnea No lower extremity edema   Pertinent labs: Lab Results  Component Value Date   CHOL 153 04/13/2019   HDL 46 04/13/2019   LDLCALC 94 04/13/2019   TRIG 67 04/13/2019   CHOLHDL 3.3 04/13/2019   Lab Results  Component Value Date   NA 140 03/30/2020   K 4.1 03/30/2020   CREATININE 0.76 03/30/2020   GFRNONAA >60 03/30/2020   GFRAA >60 03/30/2020   GLUCOSE 99 03/30/2020     The 10-year ASCVD risk score Mikey Bussing DC Jr., et al., 2013) is: 8.7%   --------------------------------------------------------------------------------------------------- Lipid/Cholesterol, Follow-up  Last lipid panel Other pertinent labs  Lab Results  Component Value Date   CHOL 153 04/13/2019   HDL 46 04/13/2019   LDLCALC 94 04/13/2019   TRIG 67 04/13/2019   CHOLHDL 3.3 04/13/2019   Lab Results  Component Value Date   ALT 21 04/13/2019   AST 17 04/13/2019   PLT 295 03/30/2020     He was last seen for this 6 months ago.  Management since that  visit includes no changes.  He reports excellent compliance with treatment. He is not having side effects.   Symptoms: No chest pain No chest pressure/discomfort  No dyspnea No lower extremity edema  No numbness or tingling of extremity No orthopnea  No palpitations No paroxysmal nocturnal dyspnea  No speech difficulty No syncope    The 10-year ASCVD risk score Mikey Bussing DC Jr., et al., 2013) is: 8.7%  ---------------------------------------------------------------------------------------------------  Patient Active Problem List   Diagnosis Date Noted  . Ischemia of extremity 03/23/2020  . Cervical myelopathy ([place]) 03/14/2020  . Ischemia of right lower extremity 11/15/2019  . Grief 10/14/2019  . Peripheral neuropathy 10/14/2019  . Chronic left-sided low back pain without sciatica 04/13/2019  . History of back surgery 04/13/2019  . Overweight 04/13/2019  . Atherosclerosis of lower extremity with claudication ([place]) 11/18/2016  . Complication associated with vascular device 11/13/2016  . Hyperlipidemia 06/18/2016  . Hypertension 06/18/2016  . Radiculopathy of lumbar region 06/10/2016  . PVD (peripheral vascular disease) ([place]) 05/08/2016  . 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 Prediabetes 08/25/2014   Past Medical History:  Diagnosis Date  . Allergy   . Dyspnea    former smoker. only doe  . History of kidney stones   . Peripheral vascular disease [facility]    Social History  Tobacco Use  . Smoking status: Former Smoker    Packs/day: 1.50    Years: 38.00    Pack years: 57.00    Types: Cigarettes    Quit date: 12/20/2014    Years since quitting: 5.3  . Smokeless tobacco: Former Network engineer  . Vaping Use: Never used  Substance Use Topics  . Alcohol use: Not Currently  . Drug use: No   No Known Allergies   Medications: Outpatient Medications Prior to Visit    Medication Sig  . acetaminophen (TYLENOL) 650 MG CR tablet Take 1,300 mg by mouth every 8 (eight) hours as needed for pain.   Marland Kitchen apixaban (ELIQUIS) 5 MG TABS tablet Take 1 tablet (5 mg total) by mouth 2 (two) times daily.  Marland Kitchen aspirin EC 81 MG tablet Take 1 tablet (81 mg total) by mouth daily. Restart 5 days after surgery  . clopidogrel (PLAVIX) 75 MG tablet Take 1 tablet (75 mg total) by mouth daily at 6 (six) AM.  . gabapentin (NEURONTIN) 300 MG capsule TAKE 1 CAPSULE BY MOUTH AT  BEDTIME  . lisinopril (ZESTRIL) 10 MG tablet TAKE 1 TABLET BY MOUTH  DAILY  . methocarbamol (ROBAXIN) 500 MG tablet Take 1 tablet (500 mg total) by mouth every 6 (six) hours as needed for muscle spasms.  Marland Kitchen oxyCODONE (OXY IR/ROXICODONE) 5 MG immediate release tablet Take 1 tablet (5 mg total) by mouth every 6 (six) hours as needed for severe pain.  Marland Kitchen senna-docusate (SENOKOT-S) 8.6-50 MG tablet Take 1 tablet by mouth at bedtime as needed for mild constipation.  . simvastatin (ZOCOR) 20 MG tablet TAKE 1 TABLET BY MOUTH  DAILY AT 6 PM   No facility-administered medications prior to visit.    Review of Systems  Constitutional: Negative.   Respiratory: Negative.   Cardiovascular: Negative.   Gastrointestinal: Positive for constipation. Negative for abdominal distention, abdominal pain, anal bleeding, blood in stool, diarrhea, nausea, rectal pain and vomiting.  Neurological: Negative for dizziness, light-headedness and headaches.      Objective    BP 124/79 (BP Location: Left Arm, Patient Position: Sitting, Cuff Size: Large)   Pulse 92   Temp 97.8 F (36.6 C) (Oral)   Wt 202 lb (91.6 kg)   BMI 27.40 kg/m    Physical Exam Vitals reviewed.  Constitutional:      General: He is not in acute distress.    Appearance: Normal appearance. He is not diaphoretic.     Comments: C collar in place, walking with walker  HENT:     Head: Normocephalic and atraumatic.  Eyes:     General: No scleral icterus.     Conjunctiva/sclera: Conjunctivae normal.  Cardiovascular:     Rate and Rhythm: Normal rate and regular rhythm.     Pulses: Normal pulses.     Heart sounds: Normal heart sounds. No murmur heard.   Pulmonary:     Effort: Pulmonary effort is normal. No respiratory distress.     Breath sounds: Normal breath sounds. No wheezing or rhonchi.  Abdominal:     General: There is no distension.     Palpations: Abdomen is soft.     Tenderness: There is no abdominal tenderness.  Musculoskeletal:     Cervical back: Neck supple.     Left lower leg: No edema.     Comments: RLE dressings intact. Incisions c/d/i  Lymphadenopathy:     Cervical: No cervical adenopathy.  Skin:    General: Skin is warm and dry.  Capillary Refill: Capillary refill takes less than 2 seconds.     Findings: No rash.  Neurological:     Mental Status: He is alert and oriented to person, place, and time.     Cranial Nerves: No cranial nerve deficit.  Psychiatric:        Mood and Affect: Mood normal.        Behavior: Behavior normal.       No results found for any visits on 04/16/20.  Assessment & Plan     Problem List Items Addressed This Visit      Cardiovascular and Mediastinum   PVD (peripheral vascular disease) ([place])    S/p R fem-tib bypass Followed by [facility] Continue statin and antihypertensives      Hypertension - Primary    Well controlled Continue current medications Recheck metabolic panel F/u in 6 months       Atherosclerosis of lower extremity with claudication ([place])    Recent R fem-tib bypass Following with vascular surgery Needs good risk factor management Continue statin        Other   Hyperlipidemia    Previously well controlled Continue statin Repeat FLP and CMP Goal LDL < 70 given PVD      Relevant Orders   Hepatic function panel   Lipid panel   Prediabetes    Encourage low carb diet Recheck A1c      Relevant Orders   Hemoglobin A1c   Overweight    Discussed  importance of healthy weight management Discussed diet and exercise        Other Visit Diagnoses    Need for Tdap vaccination       Relevant Orders   Tdap vaccine greater than or equal to 7yo IM   Need for influenza vaccination       Relevant Orders   Flu Vaccine QUAD 6+ mos PF IM (Fluarix Quad PF)   Anemia, unspecified type       Relevant Orders   CBC w/Diff/Platelet       Patient informed that Medicare will likely not cover TDAP and he wishes to proceed anyways.  Return in about 6 months (around 10/14/2020) for CPE, AWV.      I, Lavon Paganini, MD, have reviewed all documentation for this visit. The documentation on 04/16/20 for the exam, diagnosis, procedures, and orders are all accurate and complete.   Lamoyne Palencia, Dionne Bucy, MD, MPH [facility]

## 2020-04-17 DIAGNOSIS — Z23 Encounter for immunization: Secondary | ICD-10-CM | POA: Diagnosis not present

## 2020-04-17 DIAGNOSIS — I739 Peripheral vascular disease, unspecified: Secondary | ICD-10-CM | POA: Diagnosis not present

## 2020-04-17 DIAGNOSIS — I10 Essential (primary) hypertension: Secondary | ICD-10-CM | POA: Diagnosis not present

## 2020-04-17 LAB — HEPATIC FUNCTION PANEL
ALT: 19 IU/L (ref 0–44)
AST: 17 IU/L (ref 0–40)
Albumin: 4.3 g/dL (ref 3.8–4.8)
Alkaline Phosphatase: 123 IU/L — ABNORMAL HIGH (ref 44–121)
Bilirubin Total: 0.4 mg/dL (ref 0.0–1.2)
Bilirubin, Direct: 0.1 mg/dL (ref 0.00–0.40)
Total Protein: 7 g/dL (ref 6.0–8.5)

## 2020-04-17 LAB — CBC WITH DIFFERENTIAL/PLATELET
Basophils Absolute: 0.1 10*3/uL (ref 0.0–0.2)
Basos: 1 %
EOS (ABSOLUTE): 0.3 10*3/uL (ref 0.0–0.4)
Eos: 5 %
Hematocrit: 34.2 % — ABNORMAL LOW (ref 37.5–51.0)
Hemoglobin: 10.6 g/dL — ABNORMAL LOW (ref 13.0–17.7)
Immature Grans (Abs): 0 10*3/uL (ref 0.0–0.1)
Immature Granulocytes: 0 %
Lymphocytes Absolute: 1.5 10*3/uL (ref 0.7–3.1)
Lymphs: 26 %
MCH: 25.3 pg — ABNORMAL LOW (ref 26.6–33.0)
MCHC: 31 g/dL — ABNORMAL LOW (ref 31.5–35.7)
MCV: 82 fL (ref 79–97)
Monocytes Absolute: 0.6 10*3/uL (ref 0.1–0.9)
Monocytes: 11 %
Neutrophils Absolute: 3.2 10*3/uL (ref 1.4–7.0)
Neutrophils: 57 %
Platelets: 317 10*3/uL (ref 150–450)
RBC: 4.19 x10E6/uL (ref 4.14–5.80)
RDW: 15.5 % — ABNORMAL HIGH (ref 11.6–15.4)
WBC: 5.6 10*3/uL (ref 3.4–10.8)

## 2020-04-17 LAB — LIPID PANEL
Chol/HDL Ratio: 3.4 ratio (ref 0.0–5.0)
Cholesterol, Total: 155 mg/dL (ref 100–199)
HDL: 46 mg/dL (ref >39)
LDL Chol Calc (NIH): 89 mg/dL (ref 0–99)
Triglycerides: 110 mg/dL (ref 0–149)
VLDL Cholesterol Cal: 20 mg/dL (ref 5–40)

## 2020-04-17 LAB — HEMOGLOBIN A1C
Est. average glucose Bld gHb Est-mCnc: 111 mg/dL
Hgb A1c MFr Bld: 5.5 % (ref 4.8–5.6)

## 2020-04-18 ENCOUNTER — Telehealth (INDEPENDENT_AMBULATORY_CARE_PROVIDER_SITE_OTHER): Payer: Self-pay

## 2020-04-18 DIAGNOSIS — Z981 Arthrodesis status: Secondary | ICD-10-CM | POA: Diagnosis not present

## 2020-04-18 DIAGNOSIS — Z4789 Encounter for other orthopedic aftercare: Secondary | ICD-10-CM | POA: Diagnosis not present

## 2020-04-18 DIAGNOSIS — M4802 Spinal stenosis, cervical region: Secondary | ICD-10-CM | POA: Diagnosis not present

## 2020-04-18 DIAGNOSIS — I739 Peripheral vascular disease, unspecified: Secondary | ICD-10-CM | POA: Diagnosis not present

## 2020-04-18 DIAGNOSIS — Z7902 Long term (current) use of antithrombotics/antiplatelets: Secondary | ICD-10-CM | POA: Diagnosis not present

## 2020-04-18 DIAGNOSIS — Z95820 Peripheral vascular angioplasty status with implants and grafts: Secondary | ICD-10-CM | POA: Diagnosis not present

## 2020-04-18 DIAGNOSIS — G959 Disease of spinal cord, unspecified: Secondary | ICD-10-CM | POA: Diagnosis not present

## 2020-04-18 DIAGNOSIS — Z79891 Long term (current) use of opiate analgesic: Secondary | ICD-10-CM | POA: Diagnosis not present

## 2020-04-18 DIAGNOSIS — Z9181 History of falling: Secondary | ICD-10-CM | POA: Diagnosis not present

## 2020-04-18 DIAGNOSIS — I10 Essential (primary) hypertension: Secondary | ICD-10-CM | POA: Diagnosis not present

## 2020-04-18 NOTE — Telephone Encounter (Signed)
The pt called and left a message on the nurses line wanting to know if at his appt on 9/30 is Dr. Delana Meyer going to remove the staples out of his leg the appt note references this but does not say that this is what the appt is for. Please advise.

## 2020-04-18 NOTE — Telephone Encounter (Signed)
Possibly depending on the level of wound healing, wich Dr. Delana Meyer will determine at the time of visit

## 2020-04-19 ENCOUNTER — Other Ambulatory Visit: Payer: Self-pay

## 2020-04-19 ENCOUNTER — Encounter (INDEPENDENT_AMBULATORY_CARE_PROVIDER_SITE_OTHER): Payer: Self-pay | Admitting: Vascular Surgery

## 2020-04-19 ENCOUNTER — Telehealth: Payer: Self-pay

## 2020-04-19 ENCOUNTER — Ambulatory Visit (INDEPENDENT_AMBULATORY_CARE_PROVIDER_SITE_OTHER): Payer: Medicare Other | Admitting: Vascular Surgery

## 2020-04-19 VITALS — BP 153/72 | HR 87 | Ht 72.0 in | Wt 203.0 lb

## 2020-04-19 DIAGNOSIS — M4322 Fusion of spine, cervical region: Secondary | ICD-10-CM | POA: Diagnosis not present

## 2020-04-19 DIAGNOSIS — I998 Other disorder of circulatory system: Secondary | ICD-10-CM

## 2020-04-19 DIAGNOSIS — Z981 Arthrodesis status: Secondary | ICD-10-CM | POA: Diagnosis not present

## 2020-04-19 MED ORDER — SIMVASTATIN 40 MG PO TABS: 40.0000 mg | ORAL_TABLET | Freq: Every day | ORAL | 1 refills | Status: DC

## 2020-04-19 NOTE — Telephone Encounter (Signed)
I called the pt ans made him aware of the NP instructions.

## 2020-04-19 NOTE — Progress Notes (Signed)
Patient ID: Joshua [name], male   DOB: 1956-02-18, 64 y.o.   MRN: [id number]  Chief Complaint  Patient presents with  . Follow-up    want to know if he can have staples removed    HPI Joshua [name] is a 64 y.o. male.    Patient reports his leg still hurts but it is getting better and his foot no longer feels the intense pain.  He denies fever chills.  He notes a small amount of drainage from the upper part of the calf incision.   Past Medical History:  Diagnosis Date  . Allergy   . Dyspnea    former smoker. only doe  . History of kidney stones   . Peripheral vascular disease [facility])     Past Surgical History:  Procedure Laterality Date  . ANTERIOR CERVICAL DECOMP/DISCECTOMY FUSION N/A 03/14/2020   Procedure: ANTERIOR CERVICAL DECOMPRESSION/DISCECTOMY FUSION 3 LEVELS C4-7;  Surgeon: Meade Maw, MD;  Location: [facility];  Service: Neurosurgery;  Laterality: N/A;  . BACK SURGERY  2011  . CENTRAL LINE INSERTION Right 09/10/2016   Procedure: CENTRAL LINE INSERTION;  Surgeon: Katha Cabal, MD;  Location: [facility];  Service: Vascular;  Laterality: Right;  . CERVICAL FUSION     C4-c7 on Mar 14, 2020  . COLONOSCOPY  2013 ?  . FEMORAL-POPLITEAL BYPASS GRAFT Right 09/10/2016   Procedure: BYPASS GRAFT FEMORAL-POPLITEAL ARTERY ( BELOW KNEE );  Surgeon: Katha Cabal, MD;  Location: [facility];  Service: Vascular;  Laterality: Right;  . FEMORAL-TIBIAL BYPASS GRAFT Right 03/23/2020   Procedure: BYPASS GRAFT RIGHT FEMORAL- DISTAL TIBIAL ARTERY WITH DISTAL FLOW GRAFT;  Surgeon: Katha Cabal, MD;  Location: [facility];  Service: Vascular;  Laterality: Right;  . LOWER EXTREMITY ANGIOGRAPHY Right 11/18/2016   Procedure: Lower Extremity Angiography;  Surgeon: Katha Cabal, MD;  Location: [place] [facility];  Service: Cardiovascular;  Laterality: Right;  . LOWER EXTREMITY ANGIOGRAPHY Right 12/09/2016   Procedure: Lower Extremity Angiography;  Surgeon: Katha Cabal, MD;  Location: [place] [facility];  Service: Cardiovascular;  Laterality: Right;  . LOWER EXTREMITY ANGIOGRAPHY Right 11/15/2019   Procedure: LOWER EXTREMITY ANGIOGRAPHY;  Surgeon: Katha Cabal, MD;  Location: [place] [facility];  Service: Cardiovascular;  Laterality: Right;  . LOWER EXTREMITY ANGIOGRAPHY Right 12/01/2019   Procedure: Lower Extremity Angiography;  Surgeon: Algernon Huxley, MD;  Location: [place] [facility];  Service: Cardiovascular;  Laterality: Right;  . LOWER EXTREMITY ANGIOGRAPHY Right 12/02/2019   Procedure: LOWER EXTREMITY ANGIOGRAPHY;  Surgeon: Katha Cabal, MD;  Location: [place] [facility];  Service: Cardiovascular;  Laterality: Right;  . LOWER EXTREMITY ANGIOGRAPHY Right 03/23/2020   Procedure: Lower Extremity Angiography;  Surgeon: Katha Cabal, MD;  Location: [place] [facility];  Service: Cardiovascular;  Laterality: Right;  . LOWER EXTREMITY INTERVENTION  11/18/2016   Procedure: Lower Extremity Intervention;  Surgeon: Katha Cabal, MD;  Location: [place] [facility];  Service: Cardiovascular;;  . PERIPHERAL VASCULAR CATHETERIZATION Right 01/02/2015   Procedure: Lower Extremity Angiography;  Surgeon: Katha Cabal, MD;  Location: [place] [facility];  Service: Cardiovascular;  Laterality: Right;  . PERIPHERAL VASCULAR CATHETERIZATION Right 06/18/2015   Procedure: Lower Extremity Angiography;  Surgeon: Algernon Huxley, MD;  Location: [place] [facility];  Service: Cardiovascular;  Laterality: Right;  . PERIPHERAL VASCULAR CATHETERIZATION  06/18/2015   Procedure: Lower Extremity Intervention;  Surgeon: Algernon Huxley, MD;  Location: [place]  [facility];  Service: Cardiovascular;;  . PERIPHERAL VASCULAR CATHETERIZATION N/A 10/09/2015   Procedure: Abdominal Aortogram w/Lower Extremity;  Surgeon: Katha Cabal, MD;  Location: [place] [facility];  Service: Cardiovascular;  Laterality: N/A;  . PERIPHERAL VASCULAR CATHETERIZATION   10/09/2015   Procedure: Lower Extremity Intervention;  Surgeon: Katha Cabal, MD;  Location: [place] [facility];  Service: Cardiovascular;;  . PERIPHERAL VASCULAR CATHETERIZATION Right 10/10/2015   Procedure: Lower Extremity Angiography;  Surgeon: Algernon Huxley, MD;  Location: [place] [facility];  Service: Cardiovascular;  Laterality: Right;  . PERIPHERAL VASCULAR CATHETERIZATION Right 10/10/2015   Procedure: Lower Extremity Intervention;  Surgeon: Algernon Huxley, MD;  Location: [place] [facility];  Service: Cardiovascular;  Laterality: Right;  . PERIPHERAL VASCULAR CATHETERIZATION Right 12/03/2015   Procedure: Lower Extremity Angiography;  Surgeon: Algernon Huxley, MD;  Location: [place] [facility];  Service: Cardiovascular;  Laterality: Right;  . PERIPHERAL VASCULAR CATHETERIZATION Right 12/04/2015   Procedure: Lower Extremity Angiography;  Surgeon: Algernon Huxley, MD;  Location: [place] [facility];  Service: Cardiovascular;  Laterality: Right;  . PERIPHERAL VASCULAR CATHETERIZATION  12/04/2015   Procedure: Lower Extremity Intervention;  Surgeon: Algernon Huxley, MD;  Location: [place] [facility];  Service: Cardiovascular;;  . PERIPHERAL VASCULAR CATHETERIZATION Right 06/30/2016   Procedure: Lower Extremity Angiography;  Surgeon: Algernon Huxley, MD;  Location: [place] [facility];  Service: Cardiovascular;  Laterality: Right;  . PERIPHERAL VASCULAR CATHETERIZATION Right 06/25/2016   Procedure: Lower Extremity Angiography;  Surgeon: Katha Cabal, MD;  Location: [place] [facility];  Service: Cardiovascular;  Laterality: Right;  . PERIPHERAL VASCULAR CATHETERIZATION Right 07/01/2016   Procedure: Lower Extremity Angiography;  Surgeon: Katha Cabal, MD;  Location: [place] [facility];  Service: Cardiovascular;  Laterality: Right;  . PERIPHERAL VASCULAR CATHETERIZATION  07/01/2016   Procedure: Lower Extremity Intervention;  Surgeon: Katha Cabal, MD;  Location: [place] [facility];   Service: Cardiovascular;;  . PERIPHERAL VASCULAR CATHETERIZATION Right 08/01/2016   Procedure: Lower Extremity Angiography;  Surgeon: Katha Cabal, MD;  Location: [place] [facility];  Service: Cardiovascular;  Laterality: Right;  . stent placement in right leg Right       No Known Allergies  Current Outpatient Medications  Medication Sig Dispense Refill  . acetaminophen (TYLENOL) 650 MG CR tablet Take 1,300 mg by mouth every 8 (eight) hours as needed for pain.     Marland Kitchen apixaban (ELIQUIS) 5 MG TABS tablet Take 1 tablet (5 mg total) by mouth 2 (two) times daily. 60 tablet 1  . aspirin EC 81 MG tablet Take 1 tablet (81 mg total) by mouth daily. Restart 5 days after surgery 30 tablet 11  . clopidogrel (PLAVIX) 75 MG tablet Take 1 tablet (75 mg total) by mouth daily at 6 (six) AM. 60 tablet 1  . gabapentin (NEURONTIN) 300 MG capsule TAKE 1 CAPSULE BY MOUTH AT  BEDTIME 90 capsule 1  . lisinopril (ZESTRIL) 10 MG tablet TAKE 1 TABLET BY MOUTH  DAILY 90 tablet 1  . methocarbamol (ROBAXIN) 500 MG tablet Take 1 tablet (500 mg total) by mouth every 6 (six) hours as needed for muscle spasms. 90 tablet 0  . oxyCODONE (OXY IR/ROXICODONE) 5 MG immediate release tablet Take 1 tablet (5 mg total) by mouth every 6 (six) hours as needed for severe pain. 40 tablet 0  . senna-docusate (SENOKOT-S) 8.6-50 MG tablet Take 1 tablet by mouth at bedtime as needed for  mild constipation. 20 tablet 0  . simvastatin (ZOCOR) 20 MG tablet TAKE 1 TABLET BY MOUTH  DAILY AT 6 PM 90 tablet 1   No current facility-administered medications for this visit.        Physical Exam BP (!) 153/72   Pulse 87   Ht 6' (1.829 m)   Wt 203 lb (92.1 kg)   BMI 27.53 kg/m  Gen:  WD/WN, NAD Skin: incisions in the groin and the distal thigh C/D/I; the medial calf incision demonstrates a small area proximally that appears to have some epidermal lysis.  There is minimal drainage.  There is no surrounding erythema edema.  There is no  purulence.  The posterior tibial pulse at the ankle is 2+ and easily palpable.  There is 2-3+ edema of the right lower extremity below the knee     Assessment/Plan: 1. Ischemia of right lower extremity Patient is doing well he maintains a palpable pulse.  His incisions appear to be well-healed in the groin and the medial thigh.  Distally this appears to be superficial and there is no evidence of infection.  He will place bacitracin ointment on this raw area 1-2 times per day and keep a dry gauze covering.  I have removed all his staples today.  He will follow-up in the office in approximately 1 month with a duplex - VAS [place] LOWER EXTREMITY ARTERIAL DUPLEX; Future      [name] 04/19/2020, 10:12 AM   This note was created with Dragon medical transcription system.  Any errors from dictation are unintentional.

## 2020-04-19 NOTE — Telephone Encounter (Signed)
Pt advised. He agreed to increase Simvastatin to  40mg  a day.  RX sent to [facility].  Pt wanted to know if he could increase gabapentin some to help with leg pain.  Currently he is taking 300mg  at bedtime.   Thanks,   -Mickel Baas

## 2020-04-19 NOTE — Telephone Encounter (Signed)
-----   Message from Virginia Crews, MD sent at 04/19/2020  9:31 AM EDT ----- Normal liver function, A1c.  Anemia is improving from surgery.  Cholesterol remains elevated.  Given vascular disease, need LDL <70. If he is taking simvastatin with good compliance, would recommend increasing dose to 40mg  daily.  Can send new Rx if patient agrees. Recheck lipids again in 3-6 months at next visit.

## 2020-04-20 ENCOUNTER — Encounter (INDEPENDENT_AMBULATORY_CARE_PROVIDER_SITE_OTHER): Payer: Self-pay | Admitting: Vascular Surgery

## 2020-04-20 DIAGNOSIS — Z9181 History of falling: Secondary | ICD-10-CM | POA: Diagnosis not present

## 2020-04-20 DIAGNOSIS — Z79891 Long term (current) use of opiate analgesic: Secondary | ICD-10-CM | POA: Diagnosis not present

## 2020-04-20 DIAGNOSIS — Z95820 Peripheral vascular angioplasty status with implants and grafts: Secondary | ICD-10-CM | POA: Diagnosis not present

## 2020-04-20 DIAGNOSIS — Z4789 Encounter for other orthopedic aftercare: Secondary | ICD-10-CM | POA: Diagnosis not present

## 2020-04-20 DIAGNOSIS — M4802 Spinal stenosis, cervical region: Secondary | ICD-10-CM | POA: Diagnosis not present

## 2020-04-20 DIAGNOSIS — I10 Essential (primary) hypertension: Secondary | ICD-10-CM | POA: Diagnosis not present

## 2020-04-20 DIAGNOSIS — G959 Disease of spinal cord, unspecified: Secondary | ICD-10-CM | POA: Diagnosis not present

## 2020-04-20 DIAGNOSIS — I739 Peripheral vascular disease, unspecified: Secondary | ICD-10-CM | POA: Diagnosis not present

## 2020-04-20 DIAGNOSIS — Z7902 Long term (current) use of antithrombotics/antiplatelets: Secondary | ICD-10-CM | POA: Diagnosis not present

## 2020-04-20 DIAGNOSIS — Z981 Arthrodesis status: Secondary | ICD-10-CM | POA: Diagnosis not present

## 2020-04-20 MED ORDER — GABAPENTIN 300 MG PO CAPS: 300.0000 mg | ORAL_CAPSULE | Freq: Three times a day (TID) | ORAL | 1 refills | Status: DC

## 2020-04-20 NOTE — Telephone Encounter (Signed)
Pt advised.  RX sent to [facility].  Thanks,   -Mickel Baas

## 2020-04-20 NOTE — Telephone Encounter (Signed)
Increase gabapentin to 300mg  TID. Ok to send new Rx

## 2020-04-23 DIAGNOSIS — I10 Essential (primary) hypertension: Secondary | ICD-10-CM | POA: Diagnosis not present

## 2020-04-23 DIAGNOSIS — Z79891 Long term (current) use of opiate analgesic: Secondary | ICD-10-CM | POA: Diagnosis not present

## 2020-04-23 DIAGNOSIS — Z4789 Encounter for other orthopedic aftercare: Secondary | ICD-10-CM | POA: Diagnosis not present

## 2020-04-23 DIAGNOSIS — Z9181 History of falling: Secondary | ICD-10-CM | POA: Diagnosis not present

## 2020-04-23 DIAGNOSIS — G959 Disease of spinal cord, unspecified: Secondary | ICD-10-CM | POA: Diagnosis not present

## 2020-04-23 DIAGNOSIS — M4802 Spinal stenosis, cervical region: Secondary | ICD-10-CM | POA: Diagnosis not present

## 2020-04-23 DIAGNOSIS — I739 Peripheral vascular disease, unspecified: Secondary | ICD-10-CM | POA: Diagnosis not present

## 2020-04-23 DIAGNOSIS — Z7902 Long term (current) use of antithrombotics/antiplatelets: Secondary | ICD-10-CM | POA: Diagnosis not present

## 2020-04-23 DIAGNOSIS — Z95820 Peripheral vascular angioplasty status with implants and grafts: Secondary | ICD-10-CM | POA: Diagnosis not present

## 2020-04-23 DIAGNOSIS — Z981 Arthrodesis status: Secondary | ICD-10-CM | POA: Diagnosis not present

## 2020-04-24 ENCOUNTER — Telehealth: Payer: Self-pay | Admitting: Family Medicine

## 2020-04-24 NOTE — Telephone Encounter (Signed)
Per initial encounter "Patient states right leg (bypass surgery leg) pain is not improving and gabapentin (NEURONTIN) 300 MG capsule and oxyCODONE (OXY IR/ROXICODONE) 5 MG immediate release tablet  is not working. Patient requesting an increase or alternate but would like a follow up call today." Contacted pt regarding his symptoms; he states his pain is 8-9 out of 10; the gabapentin and oxycodone have not worked; he took gabapentin before bed on 04/23/20; his last dose of oxycodone was 3-4 more days ago; he is out of the oxycodone, but hut he does not like to take it because of constipation; the pt describes his pain at shooting pain in his right leg; his pain is due to a bypass;  the pt would like to know if tramadol will work; he would like to speak to his PCP but he can not drive, and he has no other transport; the pt declined a virtual visit; the pt can be contacted at [phone]; he is seen by Dr Brita Romp, [facility]; will route to office for final disposition.

## 2020-04-24 NOTE — Telephone Encounter (Signed)
Please review. Thanks!  

## 2020-04-24 NOTE — Telephone Encounter (Signed)
Patient states right leg (bypass surgery leg) pain is not improving and gabapentin (NEURONTIN) 300 MG capsule and oxyCODONE (OXY IR/ROXICODONE) 5 MG immediate release tablet  is not working. Patient requesting an increase or alternate but would like a follow up call today.  [facility] [place], [address] [place] [id number]

## 2020-04-25 MED ORDER — HYDROCODONE-ACETAMINOPHEN 7.5-325 MG PO TABS: 1.0000 | ORAL_TABLET | Freq: Four times a day (QID) | ORAL | 0 refills | Status: AC | PRN

## 2020-04-25 NOTE — Telephone Encounter (Signed)
Patient advised and verbalized understanding 

## 2020-04-25 NOTE — Telephone Encounter (Signed)
Tramadol is much weaker than oxycodone. Recommend trying th vicodin, have sent prescription to [facility]. He should only take occasionally.

## 2020-04-26 DIAGNOSIS — Z7902 Long term (current) use of antithrombotics/antiplatelets: Secondary | ICD-10-CM | POA: Diagnosis not present

## 2020-04-26 DIAGNOSIS — Z4789 Encounter for other orthopedic aftercare: Secondary | ICD-10-CM | POA: Diagnosis not present

## 2020-04-26 DIAGNOSIS — M4802 Spinal stenosis, cervical region: Secondary | ICD-10-CM | POA: Diagnosis not present

## 2020-04-26 DIAGNOSIS — Z9181 History of falling: Secondary | ICD-10-CM | POA: Diagnosis not present

## 2020-04-26 DIAGNOSIS — Z981 Arthrodesis status: Secondary | ICD-10-CM | POA: Diagnosis not present

## 2020-04-26 DIAGNOSIS — I10 Essential (primary) hypertension: Secondary | ICD-10-CM | POA: Diagnosis not present

## 2020-04-26 DIAGNOSIS — I739 Peripheral vascular disease, unspecified: Secondary | ICD-10-CM | POA: Diagnosis not present

## 2020-04-26 DIAGNOSIS — G959 Disease of spinal cord, unspecified: Secondary | ICD-10-CM | POA: Diagnosis not present

## 2020-04-26 DIAGNOSIS — Z95820 Peripheral vascular angioplasty status with implants and grafts: Secondary | ICD-10-CM | POA: Diagnosis not present

## 2020-04-26 DIAGNOSIS — Z79891 Long term (current) use of opiate analgesic: Secondary | ICD-10-CM | POA: Diagnosis not present

## 2020-05-02 DIAGNOSIS — M4802 Spinal stenosis, cervical region: Secondary | ICD-10-CM | POA: Diagnosis not present

## 2020-05-02 DIAGNOSIS — I10 Essential (primary) hypertension: Secondary | ICD-10-CM | POA: Diagnosis not present

## 2020-05-02 DIAGNOSIS — Z9181 History of falling: Secondary | ICD-10-CM | POA: Diagnosis not present

## 2020-05-02 DIAGNOSIS — Z95820 Peripheral vascular angioplasty status with implants and grafts: Secondary | ICD-10-CM | POA: Diagnosis not present

## 2020-05-02 DIAGNOSIS — G959 Disease of spinal cord, unspecified: Secondary | ICD-10-CM | POA: Diagnosis not present

## 2020-05-02 DIAGNOSIS — Z4789 Encounter for other orthopedic aftercare: Secondary | ICD-10-CM | POA: Diagnosis not present

## 2020-05-02 DIAGNOSIS — Z7902 Long term (current) use of antithrombotics/antiplatelets: Secondary | ICD-10-CM | POA: Diagnosis not present

## 2020-05-02 DIAGNOSIS — Z79891 Long term (current) use of opiate analgesic: Secondary | ICD-10-CM | POA: Diagnosis not present

## 2020-05-02 DIAGNOSIS — Z981 Arthrodesis status: Secondary | ICD-10-CM | POA: Diagnosis not present

## 2020-05-02 DIAGNOSIS — I739 Peripheral vascular disease, unspecified: Secondary | ICD-10-CM | POA: Diagnosis not present

## 2020-05-07 DIAGNOSIS — Z95820 Peripheral vascular angioplasty status with implants and grafts: Secondary | ICD-10-CM | POA: Diagnosis not present

## 2020-05-07 DIAGNOSIS — M4802 Spinal stenosis, cervical region: Secondary | ICD-10-CM | POA: Diagnosis not present

## 2020-05-07 DIAGNOSIS — Z9181 History of falling: Secondary | ICD-10-CM | POA: Diagnosis not present

## 2020-05-07 DIAGNOSIS — G959 Disease of spinal cord, unspecified: Secondary | ICD-10-CM | POA: Diagnosis not present

## 2020-05-07 DIAGNOSIS — Z79891 Long term (current) use of opiate analgesic: Secondary | ICD-10-CM | POA: Diagnosis not present

## 2020-05-07 DIAGNOSIS — Z7902 Long term (current) use of antithrombotics/antiplatelets: Secondary | ICD-10-CM | POA: Diagnosis not present

## 2020-05-07 DIAGNOSIS — I10 Essential (primary) hypertension: Secondary | ICD-10-CM | POA: Diagnosis not present

## 2020-05-07 DIAGNOSIS — Z4789 Encounter for other orthopedic aftercare: Secondary | ICD-10-CM | POA: Diagnosis not present

## 2020-05-07 DIAGNOSIS — Z981 Arthrodesis status: Secondary | ICD-10-CM | POA: Diagnosis not present

## 2020-05-07 DIAGNOSIS — I739 Peripheral vascular disease, unspecified: Secondary | ICD-10-CM | POA: Diagnosis not present

## 2020-05-10 DIAGNOSIS — I739 Peripheral vascular disease, unspecified: Secondary | ICD-10-CM | POA: Diagnosis not present

## 2020-05-10 DIAGNOSIS — G959 Disease of spinal cord, unspecified: Secondary | ICD-10-CM | POA: Diagnosis not present

## 2020-05-10 DIAGNOSIS — Z7902 Long term (current) use of antithrombotics/antiplatelets: Secondary | ICD-10-CM | POA: Diagnosis not present

## 2020-05-10 DIAGNOSIS — Z4789 Encounter for other orthopedic aftercare: Secondary | ICD-10-CM | POA: Diagnosis not present

## 2020-05-10 DIAGNOSIS — Z9181 History of falling: Secondary | ICD-10-CM | POA: Diagnosis not present

## 2020-05-10 DIAGNOSIS — I10 Essential (primary) hypertension: Secondary | ICD-10-CM | POA: Diagnosis not present

## 2020-05-10 DIAGNOSIS — Z981 Arthrodesis status: Secondary | ICD-10-CM | POA: Diagnosis not present

## 2020-05-10 DIAGNOSIS — Z79891 Long term (current) use of opiate analgesic: Secondary | ICD-10-CM | POA: Diagnosis not present

## 2020-05-10 DIAGNOSIS — M4802 Spinal stenosis, cervical region: Secondary | ICD-10-CM | POA: Diagnosis not present

## 2020-05-10 DIAGNOSIS — Z95820 Peripheral vascular angioplasty status with implants and grafts: Secondary | ICD-10-CM | POA: Diagnosis not present

## 2020-05-11 ENCOUNTER — Telehealth: Payer: Self-pay

## 2020-05-11 NOTE — Telephone Encounter (Signed)
Copied from [place] [id number]. Topic: General - Inquiry >> May 11, 2020 11:46 AM Joshua [name] wrote: Reason for CRM: Patient stated that he has been taking 2 caps tid instead of 1 cap tid. He said that [place] at [facility] said she has patients on doses higher than the one he is on. So he decided on his own to up his dose to 2 capsules 3 times a day and it has been helping. He just started that yesterday and he slept the best he has in a long time. He said that he doesn't want to take hydrocodone. he just wants the pain to go away. So he thought increasing his dose would be better.He can be reached at [phone]. Please advise

## 2020-05-14 ENCOUNTER — Encounter (INDEPENDENT_AMBULATORY_CARE_PROVIDER_SITE_OTHER): Payer: Self-pay | Admitting: Vascular Surgery

## 2020-05-14 ENCOUNTER — Other Ambulatory Visit: Payer: Self-pay

## 2020-05-14 ENCOUNTER — Ambulatory Visit (INDEPENDENT_AMBULATORY_CARE_PROVIDER_SITE_OTHER): Payer: Medicare Other | Admitting: Vascular Surgery

## 2020-05-14 VITALS — BP 145/74 | HR 76 | Ht 72.0 in | Wt 206.0 lb

## 2020-05-14 DIAGNOSIS — I70219 Atherosclerosis of native arteries of extremities with intermittent claudication, unspecified extremity: Secondary | ICD-10-CM

## 2020-05-14 NOTE — Telephone Encounter (Signed)
PT calling to F/UP / yes gabapentin,  He states he up the doze him self / please advise

## 2020-05-14 NOTE — Telephone Encounter (Signed)
Dose of what? Gabapentin?

## 2020-05-14 NOTE — Progress Notes (Signed)
Patient ID: Joshua [name], male   DOB: 1955-08-31, 64 y.o.   MRN: [id number]  No chief complaint on file.   HPI Joshua [name] is a 64 y.o. male.    The patient is s/p a revision of his right leg bypass on 03/23/2020 with a redo right profunda femorus to anterior tibial bypass.  He required thrombectomy on 03/24/2020 but no abnormality was uncovered.  He has done well since that secondary issue.  Patient reports his leg still hurts but it is getting better and his foot no longer feels the intense pain.  He feels like it is more neuropathic.  He denies fever chills.  He notes a small amount of drainage from the upper part of the calf incision.   Past Medical History:  Diagnosis Date  . Allergy   . Dyspnea    former smoker. only doe  . History of kidney stones   . Peripheral vascular disease [facility])     Past Surgical History:  Procedure Laterality Date  . ANTERIOR CERVICAL DECOMP/DISCECTOMY FUSION N/A 03/14/2020   Procedure: ANTERIOR CERVICAL DECOMPRESSION/DISCECTOMY FUSION 3 LEVELS C4-7;  Surgeon: Meade Maw, MD;  Location: [facility];  Service: Neurosurgery;  Laterality: N/A;  . BACK SURGERY  2011  . CENTRAL LINE INSERTION Right 09/10/2016   Procedure: CENTRAL LINE INSERTION;  Surgeon: Katha Cabal, MD;  Location: [facility];  Service: Vascular;  Laterality: Right;  . CERVICAL FUSION     C4-c7 on Mar 14, 2020  . COLONOSCOPY  2013 ?  . FEMORAL-POPLITEAL BYPASS GRAFT Right 09/10/2016   Procedure: BYPASS GRAFT FEMORAL-POPLITEAL ARTERY ( BELOW KNEE );  Surgeon: Katha Cabal, MD;  Location: [facility];  Service: Vascular;  Laterality: Right;  . FEMORAL-TIBIAL BYPASS GRAFT Right 03/23/2020   Procedure: BYPASS GRAFT RIGHT FEMORAL- DISTAL TIBIAL ARTERY WITH DISTAL FLOW GRAFT;  Surgeon: Katha Cabal, MD;  Location: [facility];  Service: Vascular;  Laterality: Right;  . LOWER EXTREMITY ANGIOGRAPHY Right 11/18/2016   Procedure: Lower Extremity Angiography;  Surgeon:  Katha Cabal, MD;  Location: [place] [facility];  Service: Cardiovascular;  Laterality: Right;  . LOWER EXTREMITY ANGIOGRAPHY Right 12/09/2016   Procedure: Lower Extremity Angiography;  Surgeon: Katha Cabal, MD;  Location: [place] [facility];  Service: Cardiovascular;  Laterality: Right;  . LOWER EXTREMITY ANGIOGRAPHY Right 11/15/2019   Procedure: LOWER EXTREMITY ANGIOGRAPHY;  Surgeon: Katha Cabal, MD;  Location: [place] [facility];  Service: Cardiovascular;  Laterality: Right;  . LOWER EXTREMITY ANGIOGRAPHY Right 12/01/2019   Procedure: Lower Extremity Angiography;  Surgeon: Algernon Huxley, MD;  Location: [place] [facility];  Service: Cardiovascular;  Laterality: Right;  . LOWER EXTREMITY ANGIOGRAPHY Right 12/02/2019   Procedure: LOWER EXTREMITY ANGIOGRAPHY;  Surgeon: Katha Cabal, MD;  Location: [place] [facility];  Service: Cardiovascular;  Laterality: Right;  . LOWER EXTREMITY ANGIOGRAPHY Right 03/23/2020   Procedure: Lower Extremity Angiography;  Surgeon: Katha Cabal, MD;  Location: [place] [facility];  Service: Cardiovascular;  Laterality: Right;  . LOWER EXTREMITY INTERVENTION  11/18/2016   Procedure: Lower Extremity Intervention;  Surgeon: Katha Cabal, MD;  Location: [place] [facility];  Service: Cardiovascular;;  . PERIPHERAL VASCULAR CATHETERIZATION Right 01/02/2015   Procedure: Lower Extremity Angiography;  Surgeon: Katha Cabal, MD;  Location: [place] [facility];  Service: Cardiovascular;  Laterality: Right;  . PERIPHERAL VASCULAR CATHETERIZATION Right 06/18/2015   Procedure: Lower Extremity Angiography;  Surgeon: Algernon Huxley, MD;  Location: [place] [facility];  Service: Cardiovascular;  Laterality: Right;  . PERIPHERAL VASCULAR CATHETERIZATION  06/18/2015   Procedure: Lower Extremity Intervention;  Surgeon: Algernon Huxley, MD;  Location: [place] [facility];  Service: Cardiovascular;;  . PERIPHERAL VASCULAR CATHETERIZATION N/A 10/09/2015     Procedure: Abdominal Aortogram w/Lower Extremity;  Surgeon: Katha Cabal, MD;  Location: [place] [facility];  Service: Cardiovascular;  Laterality: N/A;  . PERIPHERAL VASCULAR CATHETERIZATION  10/09/2015   Procedure: Lower Extremity Intervention;  Surgeon: Katha Cabal, MD;  Location: [place] [facility];  Service: Cardiovascular;;  . PERIPHERAL VASCULAR CATHETERIZATION Right 10/10/2015   Procedure: Lower Extremity Angiography;  Surgeon: Algernon Huxley, MD;  Location: [place] [facility];  Service: Cardiovascular;  Laterality: Right;  . PERIPHERAL VASCULAR CATHETERIZATION Right 10/10/2015   Procedure: Lower Extremity Intervention;  Surgeon: Algernon Huxley, MD;  Location: [place] [facility];  Service: Cardiovascular;  Laterality: Right;  . PERIPHERAL VASCULAR CATHETERIZATION Right 12/03/2015   Procedure: Lower Extremity Angiography;  Surgeon: Algernon Huxley, MD;  Location: [place] [facility];  Service: Cardiovascular;  Laterality: Right;  . PERIPHERAL VASCULAR CATHETERIZATION Right 12/04/2015   Procedure: Lower Extremity Angiography;  Surgeon: Algernon Huxley, MD;  Location: [place] [facility];  Service: Cardiovascular;  Laterality: Right;  . PERIPHERAL VASCULAR CATHETERIZATION  12/04/2015   Procedure: Lower Extremity Intervention;  Surgeon: Algernon Huxley, MD;  Location: [place] [facility];  Service: Cardiovascular;;  . PERIPHERAL VASCULAR CATHETERIZATION Right 06/30/2016   Procedure: Lower Extremity Angiography;  Surgeon: Algernon Huxley, MD;  Location: [place] [facility];  Service: Cardiovascular;  Laterality: Right;  . PERIPHERAL VASCULAR CATHETERIZATION Right 06/25/2016   Procedure: Lower Extremity Angiography;  Surgeon: Katha Cabal, MD;  Location: [place] [facility];  Service: Cardiovascular;  Laterality: Right;  . PERIPHERAL VASCULAR CATHETERIZATION Right 07/01/2016   Procedure: Lower Extremity Angiography;  Surgeon: Katha Cabal, MD;  Location: [place] [facility];  Service:  Cardiovascular;  Laterality: Right;  . PERIPHERAL VASCULAR CATHETERIZATION  07/01/2016   Procedure: Lower Extremity Intervention;  Surgeon: Katha Cabal, MD;  Location: [place] [facility];  Service: Cardiovascular;;  . PERIPHERAL VASCULAR CATHETERIZATION Right 08/01/2016   Procedure: Lower Extremity Angiography;  Surgeon: Katha Cabal, MD;  Location: [place] [facility];  Service: Cardiovascular;  Laterality: Right;  . stent placement in right leg Right       No Known Allergies  Current Outpatient Medications  Medication Sig Dispense Refill  . acetaminophen (TYLENOL) 650 MG CR tablet Take 1,300 mg by mouth every 8 (eight) hours as needed for pain.     Marland Kitchen apixaban (ELIQUIS) 5 MG TABS tablet Take 1 tablet (5 mg total) by mouth 2 (two) times daily. 60 tablet 1  . aspirin EC 81 MG tablet Take 1 tablet (81 mg total) by mouth daily. Restart 5 days after surgery 30 tablet 11  . clopidogrel (PLAVIX) 75 MG tablet Take 1 tablet (75 mg total) by mouth daily at 6 (six) AM. 60 tablet 1  . gabapentin (NEURONTIN) 300 MG capsule Take 1 capsule (300 mg total) by mouth 3 (three) times daily. 270 capsule 1  . lisinopril (ZESTRIL) 10 MG tablet TAKE 1 TABLET BY MOUTH  DAILY 90 tablet 1  . methocarbamol (ROBAXIN) 500 MG tablet Take 1 tablet (500 mg total) by mouth every 6 (six) hours as needed for muscle spasms. 90 tablet 0  . oxyCODONE (OXY IR/ROXICODONE) 5 MG immediate  release tablet Take 1 tablet (5 mg total) by mouth every 6 (six) hours as needed for severe pain. 40 tablet 0  . senna-docusate (SENOKOT-S) 8.6-50 MG tablet Take 1 tablet by mouth at bedtime as needed for mild constipation. 20 tablet 0  . simvastatin (ZOCOR) 40 MG tablet Take 1 tablet (40 mg total) by mouth at bedtime. 90 tablet 1   No current facility-administered medications for this visit.        Physical Exam There were no vitals taken for this visit. Gen:  WD/WN, NAD Skin: incision C/D/I; 2+ palpable right PT  pulse     Assessment/Plan:  1. Atherosclerosis of lower extremity with claudication [facility]) Patient is doing well  Bypass is patent  Will follow his graft with ultrasound in 2 months.   - VAS [place] ABI WITH/WO TBI; Future - VAS [place] LOWER EXTREMITY ARTERIAL DUPLEX; Future      [name] 05/14/2020, 12:21 PM   This note was created with Dragon medical transcription system.  Any errors from dictation are unintentional.

## 2020-05-14 NOTE — Telephone Encounter (Signed)
Ok Gabapentin 600mg  TID is a good dose.  We can send a new Rx reflecting this dose change.  Also prefer to control pain with this if possible and avoid narcotics when able.

## 2020-05-15 MED ORDER — GABAPENTIN 300 MG PO CAPS
600.0000 mg | ORAL_CAPSULE | Freq: Three times a day (TID) | ORAL | 1 refills | Status: DC
Start: 2020-05-15 — End: 2021-09-12

## 2020-05-15 NOTE — Addendum Note (Signed)
Addended by: Ashley Royalty E on: 05/15/2020 09:28 AM   Modules accepted: Orders

## 2020-05-15 NOTE — Telephone Encounter (Signed)
Pt advised.  New Prescription was sent to [facility].  Thanks,   -Mickel Baas

## 2020-05-18 DIAGNOSIS — G629 Polyneuropathy, unspecified: Secondary | ICD-10-CM | POA: Diagnosis not present

## 2020-06-08 DIAGNOSIS — Z981 Arthrodesis status: Secondary | ICD-10-CM | POA: Diagnosis not present

## 2020-06-08 DIAGNOSIS — M4322 Fusion of spine, cervical region: Secondary | ICD-10-CM | POA: Diagnosis not present

## 2020-06-08 DIAGNOSIS — M47812 Spondylosis without myelopathy or radiculopathy, cervical region: Secondary | ICD-10-CM | POA: Diagnosis not present

## 2020-06-25 ENCOUNTER — Other Ambulatory Visit (INDEPENDENT_AMBULATORY_CARE_PROVIDER_SITE_OTHER): Payer: Self-pay | Admitting: Nurse Practitioner

## 2020-07-05 ENCOUNTER — Ambulatory Visit (INDEPENDENT_AMBULATORY_CARE_PROVIDER_SITE_OTHER): Payer: Medicare Other | Admitting: Vascular Surgery

## 2020-07-05 ENCOUNTER — Other Ambulatory Visit: Payer: Self-pay

## 2020-07-05 ENCOUNTER — Ambulatory Visit (INDEPENDENT_AMBULATORY_CARE_PROVIDER_SITE_OTHER): Payer: Medicare Other

## 2020-07-05 ENCOUNTER — Encounter (INDEPENDENT_AMBULATORY_CARE_PROVIDER_SITE_OTHER): Payer: Self-pay | Admitting: Vascular Surgery

## 2020-07-05 VITALS — BP 144/74 | HR 82 | Resp 16 | Wt 205.8 lb

## 2020-07-05 DIAGNOSIS — I70219 Atherosclerosis of native arteries of extremities with intermittent claudication, unspecified extremity: Secondary | ICD-10-CM

## 2020-07-05 DIAGNOSIS — I10 Essential (primary) hypertension: Secondary | ICD-10-CM

## 2020-07-05 DIAGNOSIS — E782 Mixed hyperlipidemia: Secondary | ICD-10-CM

## 2020-07-05 NOTE — Progress Notes (Signed)
MRN : [id number]  Joshua [name] is a 64 y.o. (12-Jan-1956) male who presents with chief complaint of No chief complaint on file. Marland Kitchen  History of Present Illness:   The patient returns to the office for followup and review of the noninvasive studies. There have been no interval changes in lower extremity symptoms. No interval shortening of the patient's claudication distance or development of rest pain symptoms. No new ulcers or wounds have occurred since the last visit.  The patient is s/p a revision of his right leg bypass on 03/23/2020 with a redo right profunda femorus to anterior tibial bypass.  He required thrombectomy on 03/24/2020 but no abnormality was uncovered.  He has done well since that secondary issue.  There have been no significant changes to the patient's overall health care.  The patient denies amaurosis fugax or recent TIA symptoms. There are no recent neurological changes noted. The patient denies history of DVT, PE or superficial thrombophlebitis. The patient denies recent episodes of angina or shortness of breath.   ABI Rt=1.03 and Lt=1.18  (previous ABI's Rt=1.19 and Lt=1.26) Duplex ultrasound of the right leg shows the bypass is patent with uniform flow  No outpatient medications have been marked as taking for the 07/05/20 encounter (Appointment) with Delana Meyer, Dolores Lory, MD.    Past Medical History:  Diagnosis Date  . Allergy   . Dyspnea    former smoker. only doe  . History of kidney stones   . Peripheral vascular disease [facility])     Past Surgical History:  Procedure Laterality Date  . ANTERIOR CERVICAL DECOMP/DISCECTOMY FUSION N/A 03/14/2020   Procedure: ANTERIOR CERVICAL DECOMPRESSION/DISCECTOMY FUSION 3 LEVELS C4-7;  Surgeon: Meade Maw, MD;  Location: [facility] ORS;  Service: Neurosurgery;  Laterality: N/A;  . BACK SURGERY  2011  . CENTRAL LINE INSERTION Right 09/10/2016   Procedure: CENTRAL LINE INSERTION;  Surgeon: Katha Cabal, MD;   Location: [facility];  Service: Vascular;  Laterality: Right;  . CERVICAL FUSION     C4-c7 on Mar 14, 2020  . COLONOSCOPY  2013 ?  . FEMORAL-POPLITEAL BYPASS GRAFT Right 09/10/2016   Procedure: BYPASS GRAFT FEMORAL-POPLITEAL ARTERY ( BELOW KNEE );  Surgeon: Katha Cabal, MD;  Location: [facility];  Service: Vascular;  Laterality: Right;  . FEMORAL-TIBIAL BYPASS GRAFT Right 03/23/2020   Procedure: BYPASS GRAFT RIGHT FEMORAL- DISTAL TIBIAL ARTERY WITH DISTAL FLOW GRAFT;  Surgeon: Katha Cabal, MD;  Location: [facility];  Service: Vascular;  Laterality: Right;  . LOWER EXTREMITY ANGIOGRAPHY Right 11/18/2016   Procedure: Lower Extremity Angiography;  Surgeon: Katha Cabal, MD;  Location: [place] [facility];  Service: Cardiovascular;  Laterality: Right;  . LOWER EXTREMITY ANGIOGRAPHY Right 12/09/2016   Procedure: Lower Extremity Angiography;  Surgeon: Katha Cabal, MD;  Location: [place] [facility];  Service: Cardiovascular;  Laterality: Right;  . LOWER EXTREMITY ANGIOGRAPHY Right 11/15/2019   Procedure: LOWER EXTREMITY ANGIOGRAPHY;  Surgeon: Katha Cabal, MD;  Location: [place] [facility];  Service: Cardiovascular;  Laterality: Right;  . LOWER EXTREMITY ANGIOGRAPHY Right 12/01/2019   Procedure: Lower Extremity Angiography;  Surgeon: Algernon Huxley, MD;  Location: [place] [facility];  Service: Cardiovascular;  Laterality: Right;  . LOWER EXTREMITY ANGIOGRAPHY Right 12/02/2019   Procedure: LOWER EXTREMITY ANGIOGRAPHY;  Surgeon: Katha Cabal, MD;  Location: [place] [facility];  Service: Cardiovascular;  Laterality: Right;  . LOWER EXTREMITY ANGIOGRAPHY Right 03/23/2020   Procedure: Lower Extremity Angiography;  Surgeon: Delana Meyer,  Dolores Lory, MD;  Location: [place] [facility];  Service: Cardiovascular;  Laterality: Right;  . LOWER EXTREMITY INTERVENTION  11/18/2016   Procedure: Lower Extremity Intervention;  Surgeon: Katha Cabal, MD;  Location: [place] [facility];   Service: Cardiovascular;;  . PERIPHERAL VASCULAR CATHETERIZATION Right 01/02/2015   Procedure: Lower Extremity Angiography;  Surgeon: Katha Cabal, MD;  Location: [place] [facility];  Service: Cardiovascular;  Laterality: Right;  . PERIPHERAL VASCULAR CATHETERIZATION Right 06/18/2015   Procedure: Lower Extremity Angiography;  Surgeon: Algernon Huxley, MD;  Location: [place] [facility];  Service: Cardiovascular;  Laterality: Right;  . PERIPHERAL VASCULAR CATHETERIZATION  06/18/2015   Procedure: Lower Extremity Intervention;  Surgeon: Algernon Huxley, MD;  Location: [place] [facility];  Service: Cardiovascular;;  . PERIPHERAL VASCULAR CATHETERIZATION N/A 10/09/2015   Procedure: Abdominal Aortogram w/Lower Extremity;  Surgeon: Katha Cabal, MD;  Location: [place] [facility];  Service: Cardiovascular;  Laterality: N/A;  . PERIPHERAL VASCULAR CATHETERIZATION  10/09/2015   Procedure: Lower Extremity Intervention;  Surgeon: Katha Cabal, MD;  Location: [place] [facility];  Service: Cardiovascular;;  . PERIPHERAL VASCULAR CATHETERIZATION Right 10/10/2015   Procedure: Lower Extremity Angiography;  Surgeon: Algernon Huxley, MD;  Location: [place] [facility];  Service: Cardiovascular;  Laterality: Right;  . PERIPHERAL VASCULAR CATHETERIZATION Right 10/10/2015   Procedure: Lower Extremity Intervention;  Surgeon: Algernon Huxley, MD;  Location: [place] [facility];  Service: Cardiovascular;  Laterality: Right;  . PERIPHERAL VASCULAR CATHETERIZATION Right 12/03/2015   Procedure: Lower Extremity Angiography;  Surgeon: Algernon Huxley, MD;  Location: [place] [facility];  Service: Cardiovascular;  Laterality: Right;  . PERIPHERAL VASCULAR CATHETERIZATION Right 12/04/2015   Procedure: Lower Extremity Angiography;  Surgeon: Algernon Huxley, MD;  Location: [place] [facility];  Service: Cardiovascular;  Laterality: Right;  . PERIPHERAL VASCULAR CATHETERIZATION  12/04/2015   Procedure: Lower Extremity Intervention;   Surgeon: Algernon Huxley, MD;  Location: [place] [facility];  Service: Cardiovascular;;  . PERIPHERAL VASCULAR CATHETERIZATION Right 06/30/2016   Procedure: Lower Extremity Angiography;  Surgeon: Algernon Huxley, MD;  Location: [place] [facility];  Service: Cardiovascular;  Laterality: Right;  . PERIPHERAL VASCULAR CATHETERIZATION Right 06/25/2016   Procedure: Lower Extremity Angiography;  Surgeon: Katha Cabal, MD;  Location: [place] [facility];  Service: Cardiovascular;  Laterality: Right;  . PERIPHERAL VASCULAR CATHETERIZATION Right 07/01/2016   Procedure: Lower Extremity Angiography;  Surgeon: Katha Cabal, MD;  Location: [place] [facility];  Service: Cardiovascular;  Laterality: Right;  . PERIPHERAL VASCULAR CATHETERIZATION  07/01/2016   Procedure: Lower Extremity Intervention;  Surgeon: Katha Cabal, MD;  Location: [place] [facility];  Service: Cardiovascular;;  . PERIPHERAL VASCULAR CATHETERIZATION Right 08/01/2016   Procedure: Lower Extremity Angiography;  Surgeon: Katha Cabal, MD;  Location: [place] [facility];  Service: Cardiovascular;  Laterality: Right;  . stent placement in right leg Right     Social History Social History   Tobacco Use  . Smoking status: Former Smoker    Packs/day: 1.50    Years: 38.00    Pack years: 57.00    Types: Cigarettes    Quit date: 12/20/2014    Years since quitting: 5.5  . Smokeless tobacco: Former Network engineer  . Vaping Use: Never used  Substance Use Topics  . Alcohol use: Not Currently  . Drug use: No    Family History Family History  Problem Relation Age  of Onset  . Diabetes Mother   . Hypertension Mother   . Heart murmur Mother   . Colon cancer Neg Hx   . Breast cancer Neg Hx     No Known Allergies   REVIEW OF SYSTEMS (Negative unless checked)  Constitutional: [] Weight loss  [] Fever  [] Chills Cardiac: [] Chest pain   [] Chest pressure   [] Palpitations   [] Shortness of breath when laying flat    [] Shortness of breath with exertion. Vascular:  [x] Pain in legs with walking   [] Pain in legs at rest  [] History of DVT   [] Phlebitis   [] Swelling in legs   [] Varicose veins   [] Non-healing ulcers Pulmonary:   [] Uses home oxygen   [] Productive cough   [] Hemoptysis   [] Wheeze  [] COPD   [] Asthma Neurologic:  [] Dizziness   [] Seizures   [] History of stroke   [] History of TIA  [] Aphasia   [] Vissual changes   [] Weakness or numbness in arm   [] Weakness or numbness in leg Musculoskeletal:   [] Joint swelling   [] Joint pain   [] Low back pain Hematologic:  [] Easy bruising  [] Easy bleeding   [] Hypercoagulable state   [] Anemic Gastrointestinal:  [] Diarrhea   [] Vomiting  [] Gastroesophageal reflux/heartburn   [] Difficulty swallowing. Genitourinary:  [] Chronic kidney disease   [] Difficult urination  [] Frequent urination   [] Blood in urine Skin:  [] Rashes   [] Ulcers  Psychological:  [] History of anxiety   []  History of major depression.  Physical Examination  There were no vitals filed for this visit. There is no height or weight on file to calculate BMI. Gen: WD/WN, NAD Head: [place]/AT, No temporalis wasting.  Ear/Nose/Throat: Hearing grossly intact, nares w/o erythema or drainage Eyes: PER, EOMI, sclera nonicteric.  Neck: Supple, no large masses.   Pulmonary:  Good air movement, no audible wheezing bilaterally, no use of accessory muscles.  Cardiac: RRR, no JVD Vascular: incisions CD&I Vessel Right Left  Radial Palpable Palpable  PT Palpable Palpable  DP Not Palpable Palpable  Gastrointestinal: Non-distended. No guarding/no peritoneal signs.  Musculoskeletal: M/S 5/5 throughout.  No deformity or atrophy.  Neurologic: CN 2-12 intact. Symmetrical.  Speech is fluent. Motor exam as listed above. Psychiatric: Judgment intact, Mood & affect appropriate for pt's clinical situation. Dermatologic: No rashes or ulcers noted.  No changes consistent with cellulitis. Lymph : No lichenification or skin changes of  chronic lymphedema.  CBC Lab Results  Component Value Date   WBC 5.6 04/16/2020   HGB 10.6 (L) 04/16/2020   HCT 34.2 (L) 04/16/2020   MCV 82 04/16/2020   PLT 317 04/16/2020    BMET    Component Value Date/Time   NA 140 03/30/2020 0536   NA 141 10/14/2019 1101   K 4.1 03/30/2020 0536   CL 102 03/30/2020 0536   CO2 28 03/30/2020 0536   GLUCOSE 99 03/30/2020 0536   BUN 12 03/30/2020 0536   BUN 15 10/14/2019 1101   BUN 16 07/03/2014 1205   CREATININE 0.76 03/30/2020 0536   CREATININE 1.34 (H) 07/03/2014 1205   CALCIUM 8.6 (L) 03/30/2020 0536   GFRNONAA >60 03/30/2020 0536   GFRNONAA 59 (L) 07/03/2014 1205   GFRAA >60 03/30/2020 0536   GFRAA >60 07/03/2014 1205   CrCl cannot be calculated (Patient's most recent lab result is older than the maximum 21 days allowed.).  COAG Lab Results  Component Value Date   INR 1.0 03/05/2020   INR 1.11 06/25/2016   INR 1.90 05/12/2016    Radiology No results found.  Assessment/Plan 1. Atherosclerosis of lower extremity with claudication ([place]) Recommend:  The patient is status post successful right leg bypass.  The patient reports that the claudication symptoms and leg pain is essentially gone.   The patient denies lifestyle limiting changes at this point in time.  No further invasive studies, angiography or surgery at this time The patient should continue walking and begin a more formal exercise program.  The patient should continue antiplatelet therapy and aggressive treatment of the lipid abnormalities  Smoking cessation was again discussed  The patient should continue wearing graduated compression socks 10-15 mmHg strength to control the mild edema.  Patient should undergo noninvasive studies as ordered. The patient will follow up with me after the studies.   - VAS [place] LOWER EXTREMITY ARTERIAL DUPLEX; Future - VAS [place] ABI WITH/WO TBI; Future  2. Primary hypertension Continue antihypertensive medications as already  ordered, these medications have been reviewed and there are no changes at this time.   3. Mixed hyperlipidemia Continue statin as ordered and reviewed, no changes at this time   [name], MD  07/05/2020 1:05 PM

## 2020-07-07 ENCOUNTER — Encounter (INDEPENDENT_AMBULATORY_CARE_PROVIDER_SITE_OTHER): Payer: Self-pay | Admitting: Vascular Surgery

## 2020-08-01 ENCOUNTER — Telehealth (INDEPENDENT_AMBULATORY_CARE_PROVIDER_SITE_OTHER): Payer: Self-pay

## 2020-08-01 NOTE — Telephone Encounter (Signed)
He should contact his insurance to see if xarelto will be cheaper.  Unfortunately, with this type of blood thinner all of the easily available options tend to be somewhat pricier.  If his insurance does not cover Xarelto, we can try Coumadin, however Coumadin does require frequent blood draws(weekly) as well as dietary restrictions.

## 2020-08-01 NOTE — Telephone Encounter (Signed)
The pt called and left a message on the nurses  VM saying that he would like his eliquis change to a cheaper option. Please advise.

## 2020-08-02 NOTE — Telephone Encounter (Signed)
I called the client and made him aware of the Np's instructions he would like to know can he be place back on  Plavix and a 81 Mg Asprin like previously before.

## 2020-08-02 NOTE — Telephone Encounter (Signed)
Being on plavix and aspirin is probably not in his best interest.  Joshua [name] has clotted his access off on multiple occasions and the best medications to prevent that would be eliquis or xarelto.  Coumadin also works, but again, that medication can be a bit more of headache with the lab draws and dietary restrictions.  If we place him back on plavix and aspirin, he needs to understand that that there is a good possibility that his bypass will clot again (I can't say when or if it will happen).  And while we'll do everything we can to get that bypass open, there is always the possibility that we will not be able to get it open and he could lose his leg.  With that information, if Joshua [name] chooses to return to plavix and aspirin, we will put him on a full aspirin instead of a baby aspirin.

## 2020-08-02 NOTE — Telephone Encounter (Signed)
I called the pt and gave him the information for the NP he says that he will continue with the eliquis.

## 2020-08-25 ENCOUNTER — Other Ambulatory Visit: Payer: Self-pay

## 2020-08-25 ENCOUNTER — Emergency Department: Payer: Medicare Other

## 2020-08-25 ENCOUNTER — Emergency Department
Admission: EM | Admit: 2020-08-25 | Discharge: 2020-08-25 | Disposition: A | Discharge status: Home / Self Care | Payer: Medicare Other | Attending: Emergency Medicine | Admitting: Emergency Medicine

## 2020-08-25 DIAGNOSIS — Z87891 Personal history of nicotine dependence: Secondary | ICD-10-CM | POA: Insufficient documentation

## 2020-08-25 DIAGNOSIS — Z7901 Long term (current) use of anticoagulants: Secondary | ICD-10-CM | POA: Insufficient documentation

## 2020-08-25 DIAGNOSIS — Z79899 Other long term (current) drug therapy: Secondary | ICD-10-CM | POA: Diagnosis not present

## 2020-08-25 DIAGNOSIS — Z20822 Contact with and (suspected) exposure to covid-19: Secondary | ICD-10-CM | POA: Insufficient documentation

## 2020-08-25 DIAGNOSIS — R918 Other nonspecific abnormal finding of lung field: Secondary | ICD-10-CM | POA: Diagnosis not present

## 2020-08-25 DIAGNOSIS — J40 Bronchitis, not specified as acute or chronic: Secondary | ICD-10-CM | POA: Diagnosis not present

## 2020-08-25 DIAGNOSIS — I10 Essential (primary) hypertension: Secondary | ICD-10-CM | POA: Insufficient documentation

## 2020-08-25 DIAGNOSIS — R042 Hemoptysis: Secondary | ICD-10-CM

## 2020-08-25 DIAGNOSIS — R059 Cough, unspecified: Secondary | ICD-10-CM | POA: Diagnosis not present

## 2020-08-25 LAB — COMPREHENSIVE METABOLIC PANEL
ALT: 18 U/L (ref 0–44)
AST: 24 U/L (ref 15–41)
Albumin: 4 g/dL (ref 3.5–5.0)
Alkaline Phosphatase: 99 U/L (ref 38–126)
Anion gap: 9 (ref 5–15)
BUN: 17 mg/dL (ref 8–23)
CO2: 25 mmol/L (ref 22–32)
Calcium: 9.2 mg/dL (ref 8.9–10.3)
Chloride: 105 mmol/L (ref 98–111)
Creatinine, Ser: 0.97 mg/dL (ref 0.61–1.24)
GFR, Estimated: >60 mL/min (ref >60)
Glucose, Bld: 117 mg/dL — ABNORMAL HIGH (ref 70–99)
Potassium: 3.8 mmol/L (ref 3.5–5.1)
Sodium: 139 mmol/L (ref 135–145)
Total Bilirubin: 0.6 mg/dL (ref 0.3–1.2)
Total Protein: 7.7 g/dL (ref 6.5–8.1)

## 2020-08-25 LAB — CBC WITH DIFFERENTIAL/PLATELET
Abs Immature Granulocytes: 0.02 10*3/uL (ref 0.00–0.07)
Basophils Absolute: 0.1 10*3/uL (ref 0.0–0.1)
Basophils Relative: 1 %
Eosinophils Absolute: 0.2 10*3/uL (ref 0.0–0.5)
Eosinophils Relative: 3 %
HCT: 39.7 % (ref 39.0–52.0)
Hemoglobin: 12.8 g/dL — ABNORMAL LOW (ref 13.0–17.0)
Immature Granulocytes: 0 %
Lymphocytes Relative: 19 %
Lymphs Abs: 1.3 10*3/uL (ref 0.7–4.0)
MCH: 25.3 pg — ABNORMAL LOW (ref 26.0–34.0)
MCHC: 32.2 g/dL (ref 30.0–36.0)
MCV: 78.6 fL — ABNORMAL LOW (ref 80.0–100.0)
Monocytes Absolute: 0.7 10*3/uL (ref 0.1–1.0)
Monocytes Relative: 10 %
Neutro Abs: 4.8 10*3/uL (ref 1.7–7.7)
Neutrophils Relative %: 67 %
Platelets: 252 10*3/uL (ref 150–400)
RBC: 5.05 MIL/uL (ref 4.22–5.81)
RDW: 15.4 % (ref 11.5–15.5)
WBC: 7.2 10*3/uL (ref 4.0–10.5)
nRBC: 0 % (ref 0.0–0.2)

## 2020-08-25 LAB — PROTIME-INR
INR: 1.2 (ref 0.8–1.2)
Prothrombin Time: 15.1 seconds (ref 11.4–15.2)

## 2020-08-25 LAB — SARS CORONAVIRUS 2 BY RT PCR (HOSPITAL ORDER, PERFORMED IN ~~LOC~~ HOSPITAL LAB): SARS Coronavirus 2: NEGATIVE

## 2020-08-25 LAB — TROPONIN I (HIGH SENSITIVITY)
Troponin I (High Sensitivity): 4 ng/L (ref <18)
Troponin I (High Sensitivity): 7 ng/L (ref <18)

## 2020-08-25 LAB — BRAIN NATRIURETIC PEPTIDE: B Natriuretic Peptide: 38.4 pg/mL (ref 0.0–100.0)

## 2020-08-25 MED ORDER — PREDNISONE 50 MG PO TABS: 50.0000 mg | ORAL_TABLET | Freq: Every day | ORAL | 0 refills | Status: DC

## 2020-08-25 MED ORDER — PSEUDOEPH-BROMPHEN-DM 30-2-10 MG/5ML PO SYRP: 10.0000 mL | ORAL_SOLUTION | Freq: Four times a day (QID) | ORAL | 0 refills | Status: DC | PRN

## 2020-08-25 MED ORDER — IOHEXOL 350 MG/ML SOLN
75.0000 mL | Freq: Once | INTRAVENOUS | Status: AC | PRN
  Administered 2020-08-25: 75 mL via INTRAVENOUS

## 2020-08-25 MED ORDER — ALBUTEROL SULFATE HFA 108 (90 BASE) MCG/ACT IN AERS: 2.0000 | INHALATION_SPRAY | RESPIRATORY_TRACT | 0 refills | Status: DC | PRN

## 2020-08-25 NOTE — ED Notes (Signed)
Pt with xray 

## 2020-08-25 NOTE — ED Notes (Addendum)
Pt c/o intermittant cough x1 week. Pt reports he "feels like there's something caught" in his lungs to cough up, and that he started coughing up blood today. Pt is AOX4, NAD noted. Pt speaking full sentences w/ out difficulty, airway is patent. Bloody sputum noted. Pt reports hx of smoking, denies being around anyone sick recently. Pt denies pain.

## 2020-08-25 NOTE — ED Provider Notes (Signed)
[facility] Emergency Department Provider Note  ____________________________________________  Time seen: Approximately 2:55 PM  I have reviewed the triage vital signs and the nursing notes.   HISTORY  Chief Complaint Cough (Coughing up blood) and Hemoptysis    HPI Joshua [name] is a 65 y.o. male who presents the emergency department for complaint of hemoptysis.  Patient states that he has been coughing and feeling like there is "something stuck in my chest" while coughing for the past 5 days.  Today patient started coughing and saw a little bit of blood on a paper towel and he presents for evaluation.  While here patient states that he coughed up "a large clot".  Patient does have a blood stain on the front of his shirt from this episode.  Patient denies any fevers or chills, nasal congestion, sore throat.  No chronic cough.  No history of COPD or emphysema but patient states that he does have a long history of smoking but did quit 6 to 7 years ago.  No intentional weight loss.  No peripheral edema.  No history of CHF.  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  He does take 2 blood thinners due to this history.  History of significant peripheral vascular disease, neuropathy.  No history of hemoptysis.  Patient denies any shortness of breath or difficulty breathing.  No chest pain.        Past Medical History:  Diagnosis Date  . Allergy   . Dyspnea    former smoker. only doe  . History of kidney stones   . Hypertension   . Peripheral vascular disease [facility])     Patient Active Problem List   Diagnosis Date Noted  . Ischemia of extremity 03/23/2020  . Cervical myelopathy ([place]) 03/14/2020  . Ischemia of right lower extremity 11/15/2019  . Grief 10/14/2019  . Peripheral neuropathy 10/14/2019  . Chronic left-sided low back pain without sciatica 04/13/2019  . History of back  surgery 04/13/2019  . Overweight 04/13/2019  . 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 Complication associated with vascular device 11/13/2016  . Hyperlipidemia 06/18/2016  . Hypertension 06/18/2016  . Radiculopathy of lumbar region 06/10/2016  . PVD (peripheral vascular disease) ([place]) 05/08/2016  . 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 Prediabetes 08/25/2014    Past Surgical History:  Procedure Laterality Date  . ANTERIOR CERVICAL DECOMP/DISCECTOMY FUSION N/A 03/14/2020   Procedure: ANTERIOR CERVICAL DECOMPRESSION/DISCECTOMY FUSION 3 LEVELS C4-7;  Surgeon: Meade Maw, MD;  Location: [facility] ORS;  Service: Neurosurgery;  Laterality: N/A;  . BACK SURGERY  2011  . CENTRAL LINE INSERTION Right 09/10/2016   Procedure: CENTRAL LINE INSERTION;  Surgeon: Katha Cabal, MD;  Location: [facility] ORS;  Service: Vascular;  Laterality: Right;  . CERVICAL FUSION     C4-c7 on Mar 14, 2020  . COLONOSCOPY  2013 ?  . FEMORAL-POPLITEAL BYPASS GRAFT Right 09/10/2016   Procedure: BYPASS GRAFT FEMORAL-POPLITEAL ARTERY ( BELOW KNEE );  Surgeon: Katha Cabal, MD;  Location: [facility] ORS;  Service: Vascular;  Laterality: Right;  . FEMORAL-TIBIAL BYPASS GRAFT Right 03/23/2020   Procedure: BYPASS GRAFT RIGHT FEMORAL- DISTAL TIBIAL ARTERY WITH DISTAL FLOW GRAFT;  Surgeon: Katha Cabal, MD;  Location: [facility] ORS;  Service: Vascular;  Laterality: Right;  . LOWER EXTREMITY ANGIOGRAPHY Right  11/18/2016   Procedure: Lower Extremity Angiography;  Surgeon: Katha Cabal, MD;  Location: [place] [facility];  Service: Cardiovascular;  Laterality: Right;  . LOWER EXTREMITY ANGIOGRAPHY Right 12/09/2016   Procedure: Lower Extremity Angiography;  Surgeon: Katha Cabal, MD;  Location: [place] [facility];  Service: Cardiovascular;  Laterality: Right;  . LOWER EXTREMITY  ANGIOGRAPHY Right 11/15/2019   Procedure: LOWER EXTREMITY ANGIOGRAPHY;  Surgeon: Katha Cabal, MD;  Location: [place] [facility];  Service: Cardiovascular;  Laterality: Right;  . LOWER EXTREMITY ANGIOGRAPHY Right 12/01/2019   Procedure: Lower Extremity Angiography;  Surgeon: Algernon Huxley, MD;  Location: [place] [facility];  Service: Cardiovascular;  Laterality: Right;  . LOWER EXTREMITY ANGIOGRAPHY Right 12/02/2019   Procedure: LOWER EXTREMITY ANGIOGRAPHY;  Surgeon: Katha Cabal, MD;  Location: [place] [facility];  Service: Cardiovascular;  Laterality: Right;  . LOWER EXTREMITY ANGIOGRAPHY Right 03/23/2020   Procedure: Lower Extremity Angiography;  Surgeon: Katha Cabal, MD;  Location: [place] [facility];  Service: Cardiovascular;  Laterality: Right;  . LOWER EXTREMITY INTERVENTION  11/18/2016   Procedure: Lower Extremity Intervention;  Surgeon: Katha Cabal, MD;  Location: [place] [facility];  Service: Cardiovascular;;  . PERIPHERAL VASCULAR CATHETERIZATION Right 01/02/2015   Procedure: Lower Extremity Angiography;  Surgeon: Katha Cabal, MD;  Location: [place] [facility];  Service: Cardiovascular;  Laterality: Right;  . PERIPHERAL VASCULAR CATHETERIZATION Right 06/18/2015   Procedure: Lower Extremity Angiography;  Surgeon: Algernon Huxley, MD;  Location: [place] [facility];  Service: Cardiovascular;  Laterality: Right;  . PERIPHERAL VASCULAR CATHETERIZATION  06/18/2015   Procedure: Lower Extremity Intervention;  Surgeon: Algernon Huxley, MD;  Location: [place] [facility];  Service: Cardiovascular;;  . PERIPHERAL VASCULAR CATHETERIZATION N/A 10/09/2015   Procedure: Abdominal Aortogram w/Lower Extremity;  Surgeon: Katha Cabal, MD;  Location: [place] [facility];  Service: Cardiovascular;  Laterality: N/A;  . PERIPHERAL VASCULAR CATHETERIZATION  10/09/2015   Procedure: Lower Extremity Intervention;  Surgeon: Katha Cabal, MD;  Location: [place] [facility];   Service: Cardiovascular;;  . PERIPHERAL VASCULAR CATHETERIZATION Right 10/10/2015   Procedure: Lower Extremity Angiography;  Surgeon: Algernon Huxley, MD;  Location: [place] [facility];  Service: Cardiovascular;  Laterality: Right;  . PERIPHERAL VASCULAR CATHETERIZATION Right 10/10/2015   Procedure: Lower Extremity Intervention;  Surgeon: Algernon Huxley, MD;  Location: [place] [facility];  Service: Cardiovascular;  Laterality: Right;  . PERIPHERAL VASCULAR CATHETERIZATION Right 12/03/2015   Procedure: Lower Extremity Angiography;  Surgeon: Algernon Huxley, MD;  Location: [place] [facility];  Service: Cardiovascular;  Laterality: Right;  . PERIPHERAL VASCULAR CATHETERIZATION Right 12/04/2015   Procedure: Lower Extremity Angiography;  Surgeon: Algernon Huxley, MD;  Location: [place] [facility];  Service: Cardiovascular;  Laterality: Right;  . PERIPHERAL VASCULAR CATHETERIZATION  12/04/2015   Procedure: Lower Extremity Intervention;  Surgeon: Algernon Huxley, MD;  Location: [place] [facility];  Service: Cardiovascular;;  . PERIPHERAL VASCULAR CATHETERIZATION Right 06/30/2016   Procedure: Lower Extremity Angiography;  Surgeon: Algernon Huxley, MD;  Location: [place] [facility];  Service: Cardiovascular;  Laterality: Right;  . PERIPHERAL VASCULAR CATHETERIZATION Right 06/25/2016   Procedure: Lower Extremity Angiography;  Surgeon: Katha Cabal, MD;  Location: [place] [facility];  Service: Cardiovascular;  Laterality: Right;  . PERIPHERAL VASCULAR CATHETERIZATION Right 07/01/2016   Procedure: Lower Extremity Angiography;  Surgeon: Katha Cabal, MD;  Location: [place] [facility];  Service: Cardiovascular;  Laterality: Right;  . PERIPHERAL VASCULAR CATHETERIZATION  07/01/2016   Procedure: Lower Extremity Intervention;  Surgeon: Katha Cabal, MD;  Location: [place] [facility];  Service: Cardiovascular;;  . PERIPHERAL VASCULAR CATHETERIZATION Right 08/01/2016   Procedure: Lower Extremity Angiography;   Surgeon: Katha Cabal, MD;  Location: [place] [facility];  Service: Cardiovascular;  Laterality: Right;  . stent placement in right leg Right     Prior to Admission medications   Medication Sig Start Date End Date Taking? Authorizing Provider  albuterol (VENTOLIN HFA) 108 (90 Base) MCG/ACT inhaler Inhale 2 puffs into the lungs every 4 (four) hours as needed for wheezing or shortness of breath. 08/25/20  Yes Mykael Batz, Charline Bills, PA-C  brompheniramine-pseudoephedrine-DM 30-2-10 MG/5ML syrup Take 10 mLs by mouth 4 (four) times daily as needed. 08/25/20  Yes Jiselle Sheu, Charline Bills, PA-C  predniSONE (DELTASONE) 50 MG tablet Take 1 tablet (50 mg total) by mouth daily with breakfast. 08/25/20  Yes Sharif Rendell, Charline Bills, PA-C  acetaminophen (TYLENOL) 650 MG CR tablet Take 1,300 mg by mouth every 8 (eight) hours as needed for pain.     [provider]  aspirin EC 81 MG tablet Take 1 tablet (81 mg total) by mouth daily. Restart 5 days after surgery Patient not taking: Reported on 07/05/2020 03/15/20   Meade Maw, MD  clopidogrel (PLAVIX) 75 MG tablet Take 1 tablet (75 mg total) by mouth daily at 6 (six) AM. 04/01/20   Esco, Mariann Barter, MD  ELIQUIS 5 MG TABS tablet TAKE 1 TABLET BY MOUTH  TWICE DAILY 06/28/20   Schnier, Dolores Lory, MD  gabapentin (NEURONTIN) 300 MG capsule Take 2 capsules (600 mg total) by mouth 3 (three) times daily. Patient not taking: Reported on 07/05/2020 05/15/20   Virginia Crews, MD  lisinopril (ZESTRIL) 10 MG tablet TAKE 1 TABLET BY MOUTH  DAILY 03/27/20   Bacigalupo, Dionne Bucy, MD  methocarbamol (ROBAXIN) 500 MG tablet Take 1 tablet (500 mg total) by mouth every 6 (six) hours as needed for muscle spasms. 03/15/20   Meade Maw, MD  oxyCODONE (OXY IR/ROXICODONE) 5 MG immediate release tablet Take 1 tablet (5 mg total) by mouth every 6 (six) hours as needed for severe pain. 04/09/20   Kris Hartmann, NP  senna-docusate (SENOKOT-S) 8.6-50 MG tablet Take 1 tablet  by mouth at bedtime as needed for mild constipation. Patient not taking: Reported on 07/05/2020 03/15/20   Meade Maw, MD  simvastatin (ZOCOR) 40 MG tablet Take 1 tablet (40 mg total) by mouth at bedtime. 04/19/20   Virginia Crews, MD    Allergies Patient has no known allergies.  Family History  Problem Relation Age of Onset  . Diabetes Mother   . Hypertension Mother   . Heart murmur Mother   . Colon cancer Neg Hx   . Breast cancer Neg Hx     Social History Social History   Tobacco Use  . Smoking status: Former Smoker    Packs/day: 1.50    Years: 38.00    Pack years: 57.00    Types: Cigarettes    Quit date: 12/20/2014    Years since quitting: 5.6  . Smokeless tobacco: Former Network engineer  . Vaping Use: Never used  Substance Use Topics  . Alcohol use: Not Currently  . Drug use: No     Review of Systems  Constitutional: No fever/chills Eyes: No visual changes. No discharge ENT: No upper respiratory complaints. Cardiovascular: no chest  pain. Respiratory: Positive cough x5 days, now with hemoptysis. No SOB. Gastrointestinal: No abdominal pain.  No nausea, no vomiting.  No diarrhea.  No constipation. Musculoskeletal: Negative for musculoskeletal pain. Skin: Negative for rash, abrasions, lacerations, ecchymosis. Neurological: Negative for headaches, focal weakness or numbness.  10 System ROS otherwise negative.  ____________________________________________   PHYSICAL EXAM:  VITAL SIGNS: ED Triage Vitals [08/25/20 1413]  Enc Vitals Group     BP (!) 153/87     Pulse Rate (!) 114     Resp 18     Temp (!) 97.5 F (36.4 C)     Temp Source Oral     SpO2 97 %     Weight 208 lb (94.3 kg)     Height 6' (1.829 m)     Head Circumference      Peak Flow      Pain Score 0     Pain Loc      Pain Edu?      Excl. in [place]?      Constitutional: Alert and oriented. Well appearing and in no acute distress. Eyes: Conjunctivae are normal. PERRL.  EOMI. Head: Atraumatic. ENT:      Ears:       Nose: No congestion/rhinnorhea.      Mouth/Throat: Mucous membranes are moist.  No oropharyngeal erythema or edema.  Uvula is midline. Neck: No stridor.  Neck is supple full range of motion Hematological/Lymphatic/Immunilogical: No cervical lymphadenopathy. Cardiovascular: Normal rate, regular rhythm. Normal S1 and S2.  Good peripheral circulation. Respiratory: Patient did have a coughing spell while in the room evaluating the patient.  Patient had trace hemoptysis during this coughing spell.  Normal respiratory effort without tachypnea or retractions. Lungs CTAB with no wheezing, rales or rhonchi.Kermit Balo air entry to the bases with no decreased or absent breath sounds. Gastrointestinal: Bowel sounds 4 quadrants. Soft and nontender to palpation. No guarding or rigidity. No palpable masses. No distention. No CVA tenderness. Musculoskeletal: Full range of motion to all extremities. No gross deformities appreciated. Neurologic:  Normal speech and language. No gross focal neurologic deficits are appreciated.  Skin:  Skin is warm, dry and intact. No rash noted. Psychiatric: Mood and affect are normal. Speech and behavior are normal. Patient exhibits appropriate insight and judgement.   ____________________________________________   LABS (all labs ordered are listed, but only abnormal results are displayed)  Labs Reviewed  COMPREHENSIVE METABOLIC PANEL - Abnormal; Notable for the following components:      Result Value   Glucose, Bld 117 (*)    All other components within normal limits  CBC WITH DIFFERENTIAL/PLATELET - Abnormal; Notable for the following components:   Hemoglobin 12.8 (*)    MCV 78.6 (*)    MCH 25.3 (*)    All other components within normal limits  SARS CORONAVIRUS 2 BY RT PCR (HOSPITAL ORDER, [place] [facility])  PROTIME-INR  BRAIN NATRIURETIC PEPTIDE  TROPONIN I (HIGH SENSITIVITY)  TROPONIN I (HIGH  SENSITIVITY)   ____________________________________________  EKG   ____________________________________________  RADIOLOGY I personally viewed and evaluated these images as part of my medical decision making, as well as reviewing the written report by the radiologist.  ED Provider Interpretation: Lung mass identified on chest x-ray concerning for lung cancer.  Otherwise no PE.  Some changes concerning for inflammation consistent with bronchitis also identified on CT scan  DG Chest 2 View  Result Date: 08/25/2020 CLINICAL DATA:  Cough EXAM: CHEST - 2 VIEW COMPARISON:  August 02, 2009 FINDINGS: The cardiomediastinal silhouette is unchanged in contour. No pleural effusion. No pneumothorax. No acute pleuroparenchymal abnormality. Visualized abdomen is unremarkable. Multilevel degenerative changes of the thoracic spine. Status post ACDF IMPRESSION: No acute cardiopulmonary abnormality. Electronically Signed   By: Valentino Saxon MD   On: 08/25/2020 15:32   CT Angio Chest PE W and/or Wo Contrast  Result Date: 08/25/2020 CLINICAL DATA:  Cough EXAM: CT ANGIOGRAPHY CHEST WITH CONTRAST TECHNIQUE: Multidetector CT imaging of the chest was performed using the standard protocol during bolus administration of intravenous contrast. Multiplanar CT image reconstructions and MIPs were obtained to evaluate the vascular anatomy. CONTRAST:  48mL OMNIPAQUE IOHEXOL 350 MG/ML SOLN COMPARISON:  None. FINDINGS: Cardiovascular: Satisfactory opacification of the pulmonary arteries to the segmental level. No evidence of pulmonary embolism. Normal heart size. No pericardial effusion. Mild LEFT coronary artery atherosclerotic calcifications. Aorta is normal in course and caliber. Mediastinum/Nodes: There is mediastinal adenopathy. RIGHT paratracheal lymph node measures 19 mm in the short axis (series 5, image 44). Additional ill-defined RIGHT paratracheal lymph node measures 16 mm in the short axis (series 5, image 65).  Lungs/Pleura: There is ill-defined soft tissue adjacent to the RIGHT upper lobe bronchus. There is narrowing of the RIGHT upper lobe bronchus secondary to encompass and by the soft tissue. Tissue measures approximately 19 x 26 mm (series 7, image 41). There is mild downstream bronchial wall thickening as well as scattered areas of ground-glass opacities in a centrilobular distribution. There are additional scattered centrilobular ground-glass opacities within the RIGHT lower lobe. Mild paraseptal emphysema. No pleural effusion. Upper Abdomen: No acute abnormality. Musculoskeletal: No acute osseous abnormality. No aggressive osseous lesion identified. Review of the MIP images confirms the above findings. IMPRESSION: 1. Ill-defined soft tissue adjacent to the RIGHT upper lobe bronchus resulting in narrowing of the RIGHT upper lobe bronchus. There is also RIGHT paratracheal mediastinal adenopathy. Findings are concerning for primary bronchogenic malignancy. Recommend further evaluation with tissue sampling. Further staging could be performed with dedicated PET/CT. 2. Scattered centrilobular ground-glass opacities within the RIGHT lower lobe and to a lesser extent within the RIGHT lower lobe, likely reflecting an infectious or inflammatory process. 3. No evidence of pulmonary embolism. These results were called by telephone at the time of interpretation on 08/25/2020 at 6:25 pm to provider [facility] , who verbally acknowledged these results. Electronically Signed   By: Valentino Saxon MD   On: 08/25/2020 18:27    ____________________________________________    PROCEDURES  Procedure(s) performed:    Procedures    Medications  iohexol (OMNIPAQUE) 350 MG/ML injection 75 mL (75 mLs Intravenous Contrast Given 08/25/20 1757)     ____________________________________________   INITIAL IMPRESSION / ASSESSMENT AND PLAN / ED COURSE  Pertinent labs & imaging results that were available during my  care of the patient were reviewed by me and considered in my medical decision making (see chart for details).  Review of the [place] CSRS was performed in accordance of the [place] prior to dispensing any controlled drugs.           Patient's diagnosis is consistent with lung mass, bronchitis, hemoptysis.  Patient presented to the emergency department with 5 days of nonproductive cough.  Patient started to have hemoptysis today and presented to the emergency department for evaluation.  Mild amount of hemoptysis is identified.  Differential included TB, bronchitis, pneumonia, lung cancer, flash pulmonary edema.  Initial work-up was reassuring but given patient's significant history of clots even though he is on 2  blood thinners I felt a CT scan was warranted.  Thankfully no PE was identified.  However there was a peribronchitic mass concerning for lung cancer identified with some regional lymphadenopathy.  Patient will be referred to oncology for further investigation into this finding.  Patient did have some findings concerning for bronchitis and will be placed on steroid, cough medication and albuterol for this complaint.  Strict return precautions discussed with the patient.  He should follow-up with oncology as soon as possible. Patient is given ED precautions to return to the ED for any worsening or new symptoms.     ____________________________________________  FINAL CLINICAL IMPRESSION(S) / ED DIAGNOSES  Final diagnoses:  Mass of upper lobe of right lung  Bronchitis  Hemoptysis      NEW MEDICATIONS STARTED DURING THIS VISIT:  ED Discharge Orders         Ordered    predniSONE (DELTASONE) 50 MG tablet  Daily with breakfast        08/25/20 1848    brompheniramine-pseudoephedrine-DM 30-2-10 MG/5ML syrup  4 times daily PRN        08/25/20 1848    albuterol (VENTOLIN HFA) 108 (90 Base) MCG/ACT inhaler  Every 4 hours PRN        08/25/20 1848              This chart was dictated  using voice recognition software/Dragon. Despite best efforts to proofread, errors can occur which can change the meaning. Any change was purely unintentional.    Darletta Moll, PA-C 08/25/20 2021    Lucrezia Starch, MD 08/26/20 [phone]

## 2020-08-25 NOTE — ED Triage Notes (Signed)
Pt comes pov with cough. Has been coughing for a few days then started coughing up some pink tinged sputum.

## 2020-08-25 NOTE — ED Notes (Signed)
Pt given ice chips

## 2020-08-30 ENCOUNTER — Encounter: Payer: Self-pay | Admitting: *Deleted

## 2020-08-30 ENCOUNTER — Inpatient Hospital Stay: Payer: Medicare Other

## 2020-08-30 ENCOUNTER — Encounter: Payer: Self-pay | Admitting: Oncology

## 2020-08-30 ENCOUNTER — Inpatient Hospital Stay: Payer: Medicare Other | Attending: Oncology | Admitting: Oncology

## 2020-08-30 VITALS — BP 145/69 | HR 64 | Temp 96.7°F | Resp 18 | Wt 205.1 lb

## 2020-08-30 DIAGNOSIS — D509 Iron deficiency anemia, unspecified: Secondary | ICD-10-CM | POA: Diagnosis not present

## 2020-08-30 DIAGNOSIS — I739 Peripheral vascular disease, unspecified: Secondary | ICD-10-CM | POA: Diagnosis not present

## 2020-08-30 DIAGNOSIS — R918 Other nonspecific abnormal finding of lung field: Secondary | ICD-10-CM | POA: Diagnosis not present

## 2020-08-30 DIAGNOSIS — Z7901 Long term (current) use of anticoagulants: Secondary | ICD-10-CM | POA: Diagnosis not present

## 2020-08-30 DIAGNOSIS — R590 Localized enlarged lymph nodes: Secondary | ICD-10-CM

## 2020-08-30 DIAGNOSIS — I743 Embolism and thrombosis of arteries of the lower extremities: Secondary | ICD-10-CM

## 2020-08-30 DIAGNOSIS — Z7189 Other specified counseling: Secondary | ICD-10-CM | POA: Insufficient documentation

## 2020-08-30 LAB — IRON AND TIBC
Iron: 48 ug/dL (ref 45–182)
Saturation Ratios: 12 % — ABNORMAL LOW (ref 17.9–39.5)
TIBC: 391 ug/dL (ref 250–450)
UIBC: 343 ug/dL

## 2020-08-30 LAB — FERRITIN: Ferritin: 25 ng/mL (ref 24–336)

## 2020-08-30 NOTE — Progress Notes (Signed)
Hematology/Oncology Consult note [facility] Telephone:([phone] [phone]   Patient Care Team: Virginia Crews, MD as PCP - General (Family Medicine) [name], MD as Referring Physician (Physician Assistant) Bary Castilla, [name], MD (General Surgery) Telford Nab, RN as Oncology Nurse Navigator  REFERRING PROVIDER: Lavonia Drafts, MD  CHIEF COMPLAINTS/REASON FOR VISIT:  Evaluation of lung mass  HISTORY OF PRESENTING ILLNESS:   Joshua [name] is a  65 y.o.  male with PMH listed below was seen in consultation at the request of  Lavonia Drafts, MD  for evaluation of lung mass  Patient presented to emergency room on 08/25/2020 for evaluation of hemoptysis, coughing.  He coughed up small amount of blood and a large clot.  No fever, chills, chest pain, abdominal pain.  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  Patient is on Plavix and Eliquis.  Since his ER visit, he has not coughed up more blood.  08/25/2020 CT angio chest PE with and without contrast showed ill-defined soft tissue adjacent to the right upper lobe bronchus resulting in narrowing of right upper lobe bronchus.  Also right paratracheal mediastinal adenopathy.  Scattered centrilobular groundglass opacities within the right lower lobe and to a lesser extent within the right lower lobe likely reflecting an infectious or inflammatory process.  Patient lives by himself.  He has a brother who lives close by.  Patient's mother passed away recently.  57-pack-year smoking history.  He quitted smoking in 2016.  Review of Systems  Constitutional: Positive for fatigue. Negative for appetite change, chills, fever and unexpected weight change.  HENT:   Negative for hearing loss and voice change.   Eyes: Negative for eye problems and icterus.  Respiratory:  Positive for cough and hemoptysis. Negative for chest tightness and shortness of breath.   Cardiovascular: Negative for chest pain and leg swelling.  Gastrointestinal: Negative for abdominal distention and abdominal pain.  Endocrine: Negative for hot flashes.  Genitourinary: Negative for difficulty urinating, dysuria and frequency.   Musculoskeletal: Negative for arthralgias.  Skin: Negative for itching and rash.  Neurological: Negative for light-headedness and numbness.  Hematological: Negative for adenopathy. Does not bruise/bleed easily.  Psychiatric/Behavioral: Negative for confusion.    MEDICAL HISTORY:  Past Medical History:  Diagnosis Date  . Allergy   . Dyspnea    former smoker. only doe  . History of kidney stones   . Hypertension   . Peripheral vascular disease ([place])     SURGICAL HISTORY: Past Surgical History:  Procedure Laterality Date  . ANTERIOR CERVICAL DECOMP/DISCECTOMY FUSION N/A 03/14/2020   Procedure: ANTERIOR CERVICAL DECOMPRESSION/DISCECTOMY FUSION 3 LEVELS C4-7;  Surgeon: Meade Maw, MD;  Location: [facility];  Service: Neurosurgery;  Laterality: N/A;  . BACK SURGERY  2011  . CENTRAL LINE INSERTION Right 09/10/2016   Procedure: CENTRAL LINE INSERTION;  Surgeon: Katha Cabal, MD;  Location: [facility];  Service: Vascular;  Laterality: Right;  . CERVICAL FUSION     C4-c7 on Mar 14, 2020  . COLONOSCOPY  2013 ?  . FEMORAL-POPLITEAL BYPASS GRAFT Right 09/10/2016   Procedure: BYPASS GRAFT FEMORAL-POPLITEAL ARTERY ( BELOW KNEE );  Surgeon: Katha Cabal, MD;  Location: [facility];  Service: Vascular;  Laterality: Right;  . FEMORAL-TIBIAL BYPASS GRAFT Right 03/23/2020   Procedure: BYPASS GRAFT RIGHT FEMORAL- DISTAL TIBIAL ARTERY WITH DISTAL FLOW GRAFT;  Surgeon: Katha Cabal, MD;  Location:  [facility];  Service: Vascular;  Laterality: Right;  . LOWER EXTREMITY ANGIOGRAPHY Right 11/18/2016   Procedure: Lower Extremity Angiography;  Surgeon: Katha Cabal, MD;  Location: [place] [facility];  Service: Cardiovascular;  Laterality: Right;  . LOWER EXTREMITY ANGIOGRAPHY Right 12/09/2016   Procedure: Lower Extremity Angiography;  Surgeon: Katha Cabal, MD;  Location: [place] [facility];  Service: Cardiovascular;  Laterality: Right;  . LOWER EXTREMITY ANGIOGRAPHY Right 11/15/2019   Procedure: LOWER EXTREMITY ANGIOGRAPHY;  Surgeon: Katha Cabal, MD;  Location: [place] [facility];  Service: Cardiovascular;  Laterality: Right;  . LOWER EXTREMITY ANGIOGRAPHY Right 12/01/2019   Procedure: Lower Extremity Angiography;  Surgeon: Algernon Huxley, MD;  Location: [place] [facility];  Service: Cardiovascular;  Laterality: Right;  . LOWER EXTREMITY ANGIOGRAPHY Right 12/02/2019   Procedure: LOWER EXTREMITY ANGIOGRAPHY;  Surgeon: Katha Cabal, MD;  Location: [place] [facility];  Service: Cardiovascular;  Laterality: Right;  . LOWER EXTREMITY ANGIOGRAPHY Right 03/23/2020   Procedure: Lower Extremity Angiography;  Surgeon: Katha Cabal, MD;  Location: [place] [facility];  Service: Cardiovascular;  Laterality: Right;  . LOWER EXTREMITY INTERVENTION  11/18/2016   Procedure: Lower Extremity Intervention;  Surgeon: Katha Cabal, MD;  Location: [place] [facility];  Service: Cardiovascular;;  . PERIPHERAL VASCULAR CATHETERIZATION Right 01/02/2015   Procedure: Lower Extremity Angiography;  Surgeon: Katha Cabal, MD;  Location: [place] [facility];  Service: Cardiovascular;  Laterality: Right;  . PERIPHERAL VASCULAR CATHETERIZATION Right 06/18/2015   Procedure: Lower Extremity Angiography;  Surgeon: Algernon Huxley, MD;  Location: [place] [facility];  Service: Cardiovascular;  Laterality: Right;  . PERIPHERAL VASCULAR CATHETERIZATION  06/18/2015   Procedure: Lower Extremity Intervention;  Surgeon: Algernon Huxley, MD;  Location: [place] [facility];  Service: Cardiovascular;;  . PERIPHERAL VASCULAR CATHETERIZATION N/A 10/09/2015    Procedure: Abdominal Aortogram w/Lower Extremity;  Surgeon: Katha Cabal, MD;  Location: [place] [facility];  Service: Cardiovascular;  Laterality: N/A;  . PERIPHERAL VASCULAR CATHETERIZATION  10/09/2015   Procedure: Lower Extremity Intervention;  Surgeon: Katha Cabal, MD;  Location: [place] [facility];  Service: Cardiovascular;;  . PERIPHERAL VASCULAR CATHETERIZATION Right 10/10/2015   Procedure: Lower Extremity Angiography;  Surgeon: Algernon Huxley, MD;  Location: [place] [facility];  Service: Cardiovascular;  Laterality: Right;  . PERIPHERAL VASCULAR CATHETERIZATION Right 10/10/2015   Procedure: Lower Extremity Intervention;  Surgeon: Algernon Huxley, MD;  Location: [place] [facility];  Service: Cardiovascular;  Laterality: Right;  . PERIPHERAL VASCULAR CATHETERIZATION Right 12/03/2015   Procedure: Lower Extremity Angiography;  Surgeon: Algernon Huxley, MD;  Location: [place] [facility];  Service: Cardiovascular;  Laterality: Right;  . PERIPHERAL VASCULAR CATHETERIZATION Right 12/04/2015   Procedure: Lower Extremity Angiography;  Surgeon: Algernon Huxley, MD;  Location: [place] [facility];  Service: Cardiovascular;  Laterality: Right;  . PERIPHERAL VASCULAR CATHETERIZATION  12/04/2015   Procedure: Lower Extremity Intervention;  Surgeon: Algernon Huxley, MD;  Location: [place] [facility];  Service: Cardiovascular;;  . PERIPHERAL VASCULAR CATHETERIZATION Right 06/30/2016   Procedure: Lower Extremity Angiography;  Surgeon: Algernon Huxley, MD;  Location: [place] [facility];  Service: Cardiovascular;  Laterality: Right;  . PERIPHERAL VASCULAR CATHETERIZATION Right 06/25/2016   Procedure: Lower Extremity Angiography;  Surgeon: Katha Cabal, MD;  Location: [place] [facility];  Service: Cardiovascular;  Laterality: Right;  . PERIPHERAL VASCULAR CATHETERIZATION Right 07/01/2016   Procedure: Lower Extremity  Angiography;  Surgeon: Katha Cabal, MD;  Location: [place] [facility];  Service:  Cardiovascular;  Laterality: Right;  . PERIPHERAL VASCULAR CATHETERIZATION  07/01/2016   Procedure: Lower Extremity Intervention;  Surgeon: Katha Cabal, MD;  Location: [place] [facility];  Service: Cardiovascular;;  . PERIPHERAL VASCULAR CATHETERIZATION Right 08/01/2016   Procedure: Lower Extremity Angiography;  Surgeon: Katha Cabal, MD;  Location: [place] [facility];  Service: Cardiovascular;  Laterality: Right;  . stent placement in right leg Right     SOCIAL HISTORY: Social History   Socioeconomic History  . Marital status: Single    Spouse name: Not on file  . Number of children: Not on file  . Years of education: Not on file  . Highest education level: Not on file  Occupational History  . Occupation: unemployed  Tobacco Use  . Smoking status: Former Smoker    Packs/day: 1.50    Years: 38.00    Pack years: 57.00    Types: Cigarettes    Quit date: 12/20/2014    Years since quitting: 5.6  . Smokeless tobacco: Former Network engineer  . Vaping Use: Never used  Substance and Sexual Activity  . Alcohol use: Not Currently  . Drug use: No  . Sexual activity: Yes  Other Topics Concern  . Not on file  Social History Narrative  . Not on file   Social Determinants of Health   Financial Resource Strain: Not on file  Food Insecurity: Not on file  Transportation Needs: Not on file  Physical Activity: Not on file  Stress: Not on file  Social Connections: Not on file  Intimate Partner Violence: Not on file    FAMILY HISTORY: Family History  Problem Relation Age of Onset  . Diabetes Mother   . Hypertension Mother   . Heart murmur Mother   . Leukemia Mother   . Throat cancer Maternal Grandmother   . Colon cancer Neg Hx   . Breast cancer Neg Hx     ALLERGIES:  has No Known Allergies.  MEDICATIONS:  Current Outpatient Medications  Medication Sig Dispense Refill  . acetaminophen (TYLENOL) 650 MG CR tablet Take 1,300 mg by mouth every 8 (eight) hours as  needed for pain.     . brompheniramine-pseudoephedrine-DM 30-2-10 MG/5ML syrup Take 10 mLs by mouth 4 (four) times daily as needed. 200 mL 0  . clopidogrel (PLAVIX) 75 MG tablet Take 1 tablet (75 mg total) by mouth daily at 6 (six) AM. 60 tablet 1  . ELIQUIS 5 MG TABS tablet TAKE 1 TABLET BY MOUTH  TWICE DAILY 180 tablet 3  . gabapentin (NEURONTIN) 300 MG capsule Take 2 capsules (600 mg total) by mouth 3 (three) times daily. 540 capsule 1  . lisinopril (ZESTRIL) 10 MG tablet TAKE 1 TABLET BY MOUTH  DAILY 90 tablet 1  . oxyCODONE (OXY IR/ROXICODONE) 5 MG immediate release tablet Take 1 tablet (5 mg total) by mouth every 6 (six) hours as needed for severe pain. 40 tablet 0  . simvastatin (ZOCOR) 40 MG tablet Take 1 tablet (40 mg total) by mouth at bedtime. 90 tablet 1   No current facility-administered medications for this visit.     PHYSICAL EXAMINATION: ECOG PERFORMANCE STATUS: 1 - Symptomatic but completely ambulatory Vitals:   08/30/20 0944  BP: (!) 145/69  Pulse: 64  Resp: 18  Temp: (!) 96.7 F (35.9 C)   Filed Weights   08/30/20 0944  Weight: 205 lb 1.6 oz (  93 kg)    Physical Exam Constitutional:      General: He is not in acute distress.    Comments: Walks with a cane.   HENT:     Head: Normocephalic and atraumatic.  Eyes:     General: No scleral icterus. Cardiovascular:     Rate and Rhythm: Normal rate and regular rhythm.     Heart sounds: Normal heart sounds.  Pulmonary:     Effort: Pulmonary effort is normal. No respiratory distress.     Breath sounds: No wheezing.  Abdominal:     General: Bowel sounds are normal. There is no distension.     Palpations: Abdomen is soft.  Musculoskeletal:        General: No deformity. Normal range of motion.     Cervical back: Normal range of motion and neck supple.  Skin:    General: Skin is warm and dry.     Findings: No erythema or rash.  Neurological:     Mental Status: He is alert and oriented to person, place, and  time. Mental status is at baseline.     Cranial Nerves: No cranial nerve deficit.     Coordination: Coordination normal.  Psychiatric:        Mood and Affect: Mood normal.     LABORATORY DATA:  I have reviewed the data as listed Lab Results  Component Value Date   WBC 7.2 08/25/2020   HGB 12.8 (L) 08/25/2020   HCT 39.7 08/25/2020   MCV 78.6 (L) 08/25/2020   PLT 252 08/25/2020   Recent Labs    03/28/20 0421 03/29/20 0444 03/30/20 0536 04/16/20 1525 08/25/20 1512  NA 133* 139 140  --  139  K 4.1 4.0 4.1  --  3.8  CL 101 103 102  --  105  CO2 28 28 28   --  25  GLUCOSE 116* 101* 99  --  117*  BUN 11 12 12   --  17  CREATININE 0.76 0.85 0.76  --  0.97  CALCIUM 8.2* 8.4* 8.6*  --  9.2  GFRNONAA >60 >60 >60  --  >60  GFRAA >60 >60 >60  --   --   PROT  --   --   --  7.0 7.7  ALBUMIN  --   --   --  4.3 4.0  AST  --   --   --  17 24  ALT  --   --   --  19 18  ALKPHOS  --   --   --  123* 99  BILITOT  --   --   --  0.4 0.6  BILIDIR  --   --   --  0.10  --    Iron/TIBC/Ferritin/ %Sat    Component Value Date/Time   IRON 48 08/30/2020 1052   TIBC 391 08/30/2020 1052   FERRITIN 25 08/30/2020 1052   IRONPCTSAT 12 (L) 08/30/2020 1052      RADIOGRAPHIC STUDIES: I have personally reviewed the radiological images as listed and agreed with the findings in the report. DG Chest 2 View  Result Date: 08/25/2020 CLINICAL DATA:  Cough EXAM: CHEST - 2 VIEW COMPARISON:  August 02, 2009 FINDINGS: The cardiomediastinal silhouette is unchanged in contour. No pleural effusion. No pneumothorax. No acute pleuroparenchymal abnormality. Visualized abdomen is unremarkable. Multilevel degenerative changes of the thoracic spine. Status post ACDF IMPRESSION: No acute cardiopulmonary abnormality. Electronically Signed   By: Valentino Saxon MD   On: 08/25/2020 15:32  CT Angio Chest PE W and/or Wo Contrast  Result Date: 08/25/2020 CLINICAL DATA:  Cough EXAM: CT ANGIOGRAPHY CHEST WITH CONTRAST  TECHNIQUE: Multidetector CT imaging of the chest was performed using the standard protocol during bolus administration of intravenous contrast. Multiplanar CT image reconstructions and MIPs were obtained to evaluate the vascular anatomy. CONTRAST:  6mL OMNIPAQUE IOHEXOL 350 MG/ML SOLN COMPARISON:  None. FINDINGS: Cardiovascular: Satisfactory opacification of the pulmonary arteries to the segmental level. No evidence of pulmonary embolism. Normal heart size. No pericardial effusion. Mild LEFT coronary artery atherosclerotic calcifications. Aorta is normal in course and caliber. Mediastinum/Nodes: There is mediastinal adenopathy. RIGHT paratracheal lymph node measures 19 mm in the short axis (series 5, image 44). Additional ill-defined RIGHT paratracheal lymph node measures 16 mm in the short axis (series 5, image 65). Lungs/Pleura: There is ill-defined soft tissue adjacent to the RIGHT upper lobe bronchus. There is narrowing of the RIGHT upper lobe bronchus secondary to encompass and by the soft tissue. Tissue measures approximately 19 x 26 mm (series 7, image 41). There is mild downstream bronchial wall thickening as well as scattered areas of ground-glass opacities in a centrilobular distribution. There are additional scattered centrilobular ground-glass opacities within the RIGHT lower lobe. Mild paraseptal emphysema. No pleural effusion. Upper Abdomen: No acute abnormality. Musculoskeletal: No acute osseous abnormality. No aggressive osseous lesion identified. Review of the MIP images confirms the above findings. IMPRESSION: 1. Ill-defined soft tissue adjacent to the RIGHT upper lobe bronchus resulting in narrowing of the RIGHT upper lobe bronchus. There is also RIGHT paratracheal mediastinal adenopathy. Findings are concerning for primary bronchogenic malignancy. Recommend further evaluation with tissue sampling. Further staging could be performed with dedicated PET/CT. 2. Scattered centrilobular ground-glass  opacities within the RIGHT lower lobe and to a lesser extent within the RIGHT lower lobe, likely reflecting an infectious or inflammatory process. 3. No evidence of pulmonary embolism. These results were called by telephone at the time of interpretation on 08/25/2020 at 6:25 pm to provider [facility] , who verbally acknowledged these results. Electronically Signed   By: Valentino Saxon MD   On: 08/25/2020 18:27      ASSESSMENT & PLAN:  1. 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 Iron deficiency anemia, unspecified iron deficiency anemia type    #Images were independently reviewed by me and discussed with patient.  Per patient's request, I showed patient CT scan images and explained to him in details.  The findings are highly suspicious for primary lung neoplasm. I recommend PET scan for further evaluation. Refer to pulmonology for evaluation of tissue diagnosis via bronchoscopy.  #Chronic anticoagulation with Eliquis and Plavix due to peripheral artery disease/embolism. #Anemia, microcytic.  Recent blood work done at emergency room was reviewed.  Microcytic anemia 78.6. Check iron panel today.  Orders Placed This Encounter  Procedures  . NM PET Image Initial (PI) Skull Base To Thigh    Standing Status:   Future    Standing Expiration Date:   08/30/2021    Order Specific Question:   If indicated for the ordered procedure, I authorize the administration of a radiopharmaceutical per Radiology protocol    Answer:   Yes    Order Specific Question:   Preferred imaging location?    Answer:   [facility]    Order Specific Question:   Radiology Contrast Protocol - do NOT remove file path    Answer:   [email or web address]  . Iron and  TIBC  . Ferritin  . Ambulatory referral to Pulmonology    Referral Priority:   Routine    Referral Type:   Consultation    Referral Reason:   Specialty Services Required     Referred to Provider:   Tyler Pita, MD    Requested Specialty:   Pulmonary Disease    Number of Visits Requested:   1    All questions were answered. The patient knows to call the clinic with any problems questions or concerns.   Lavonia Drafts, MD    Return of visit: To be determined Thank you for this kind referral and the opportunity to participate in the care of this patient. A copy of today's note is routed to referring provider    [name], MD, PhD Hematology Oncology [facility] Pager- [phone] 08/30/2020

## 2020-08-30 NOTE — Progress Notes (Signed)
  Oncology Nurse Navigator Documentation  Navigator Location: [facility]Med [facility]c (08/30/20 1100) Referral Date to RadOnc/MedOnc: 08/27/20 (08/30/20 1100) )Navigator Encounter Type: Initial MedOnc (08/30/20 1100)   Abnormal Finding Date: 08/25/20 (08/30/20 1100)                   Treatment Phase: Abnormal Scans (08/30/20 1100) Barriers/Navigation Needs: Coordination of Care (08/30/20 1100)   Interventions: Coordination of Care;Referrals (08/30/20 1100) Referrals: Pulmonary (08/30/20 1100) Coordination of Care: Appts;Broncoscopy;Radiology (08/30/20 1100)        Acuity: Level 2-Minimal Needs (1-2 Barriers Identified) (08/30/20 1100)  met with patient during initial visit with Dr. Tasia Catchings. Pt did not have any questions at this time. Reviewed upcoming appts. Informed pt that once PET scan has been approved through insurance then will work on getting it moved sooner than 3/1. Contact info given and instructed to call with any questions or needs. Pt verbalized understanding.       Time Spent with Patient: 45 (08/30/20 1100)

## 2020-08-30 NOTE — Progress Notes (Signed)
Patient here to establish care  

## 2020-09-04 ENCOUNTER — Telehealth: Payer: Self-pay | Admitting: *Deleted

## 2020-09-04 NOTE — Telephone Encounter (Signed)
Pt called to request results from lab work drawn during last visit and if he needed any iron infusions. Per Dr. Tasia Catchings, iron is mildly low and pt may start oral ferrous sulfate supplement OTC 325mg  BID with meals. Pt advised of MD recommendations and to take colace daily to prevent constipation from iron supplement. Informed pt that if unable to tolerate oral iron then MD will further discuss iron infusions. Pt verbalized understanding. Nothing further needed at this time.

## 2020-09-05 ENCOUNTER — Ambulatory Visit: Payer: Medicare Other | Admitting: Pulmonary Disease

## 2020-09-05 ENCOUNTER — Encounter: Payer: Self-pay | Admitting: Pulmonary Disease

## 2020-09-05 ENCOUNTER — Other Ambulatory Visit: Payer: Self-pay

## 2020-09-05 ENCOUNTER — Telehealth: Payer: Self-pay | Admitting: Pulmonary Disease

## 2020-09-05 VITALS — BP 136/78 | HR 99 | Temp 98.2°F | Ht 72.0 in | Wt 199.2 lb

## 2020-09-05 DIAGNOSIS — R590 Localized enlarged lymph nodes: Secondary | ICD-10-CM

## 2020-09-05 DIAGNOSIS — R918 Other nonspecific abnormal finding of lung field: Secondary | ICD-10-CM

## 2020-09-05 DIAGNOSIS — R042 Hemoptysis: Secondary | ICD-10-CM

## 2020-09-05 DIAGNOSIS — J449 Chronic obstructive pulmonary disease, unspecified: Secondary | ICD-10-CM | POA: Diagnosis not present

## 2020-09-05 DIAGNOSIS — Z7901 Long term (current) use of anticoagulants: Secondary | ICD-10-CM | POA: Diagnosis not present

## 2020-09-05 MED ORDER — BREZTRI AEROSPHERE 160-9-4.8 MCG/ACT IN AERO: 2.0000 | INHALATION_SPRAY | Freq: Two times a day (BID) | RESPIRATORY_TRACT | 0 refills | Status: DC

## 2020-09-05 NOTE — H&P (View-Only) (Signed)
Subjective:    Patient ID: Joshua [name], male    DOB: 11/03/1955, 65 y.o.   MRN: [id number]  HPI Patient is a 66 year old male former smoker (quit 2016) who presents for evaluation of a right lung mass and right hilar adenopathy.  He is kindly referred by Dr. Tasia Catchings.  His primary care provider is Lavon Paganini, MD.  The patient presented to the emergency room on 25 August 2020 with a complaint of hemoptysis.  He had been feeling for 5 to 6 days prior to that visit that he had something "stuck in the chest "that he could not cough out.  He then coughed up a large clot of blood which prompted his visit to the emergency room.  He is on both Plavix and Eliquis for peripheral vascular stents.  He has not had any fevers, chills or sweats.  No purulent sputum.  No weight loss or anorexia.  He has not had any dyspnea, orthopnea or PND.  He does note that when he is supine his symptoms of congestion worsen and has the urge to cough.  He has not had any chest pain but does have the aforementioned chest fullness.  A CT scan of the chest was performed which showed a peribronchitic mass on the right as well as right hilar adenopathy.  He was then referred to oncology for further evaluation oncology has referred to [place] for diagnostic procedure.  Patient voices no other complaint today.  His hemoptysis has subsided.  He was treated with steroids and antibiotics.  He still notes occasional wheezing particularly when supine.   Review of Systems A 10 point review of systems was performed and it is as noted above otherwise negative.  Past Medical History:  Diagnosis Date  . Allergy   . Dyspnea    former smoker. only doe  . History of kidney stones   . Hypertension   . Peripheral vascular disease [facility])    Past Surgical History:  Procedure Laterality Date  . ANTERIOR CERVICAL DECOMP/DISCECTOMY FUSION N/A 03/14/2020   Procedure: ANTERIOR CERVICAL DECOMPRESSION/DISCECTOMY FUSION 3 LEVELS C4-7;  Surgeon:  Meade Maw, MD;  Location: [facility] ORS;  Service: Neurosurgery;  Laterality: N/A;  . BACK SURGERY  2011  . CENTRAL LINE INSERTION Right 09/10/2016   Procedure: CENTRAL LINE INSERTION;  Surgeon: Katha Cabal, MD;  Location: [facility] ORS;  Service: Vascular;  Laterality: Right;  . CERVICAL FUSION     C4-c7 on Mar 14, 2020  . COLONOSCOPY  2013 ?  . FEMORAL-POPLITEAL BYPASS GRAFT Right 09/10/2016   Procedure: BYPASS GRAFT FEMORAL-POPLITEAL ARTERY ( BELOW KNEE );  Surgeon: Katha Cabal, MD;  Location: [facility] ORS;  Service: Vascular;  Laterality: Right;  . FEMORAL-TIBIAL BYPASS GRAFT Right 03/23/2020   Procedure: BYPASS GRAFT RIGHT FEMORAL- DISTAL TIBIAL ARTERY WITH DISTAL FLOW GRAFT;  Surgeon: Katha Cabal, MD;  Location: [facility] ORS;  Service: Vascular;  Laterality: Right;  . LOWER EXTREMITY ANGIOGRAPHY Right 11/18/2016   Procedure: Lower Extremity Angiography;  Surgeon: Katha Cabal, MD;  Location: [place] [facility];  Service: Cardiovascular;  Laterality: Right;  . LOWER EXTREMITY ANGIOGRAPHY Right 12/09/2016   Procedure: Lower Extremity Angiography;  Surgeon: Katha Cabal, MD;  Location: [place] [facility];  Service: Cardiovascular;  Laterality: Right;  . LOWER EXTREMITY ANGIOGRAPHY Right 11/15/2019   Procedure: LOWER EXTREMITY ANGIOGRAPHY;  Surgeon: Katha Cabal, MD;  Location: [place] [facility];  Service: Cardiovascular;  Laterality: Right;  . LOWER EXTREMITY ANGIOGRAPHY Right  12/01/2019   Procedure: Lower Extremity Angiography;  Surgeon: Algernon Huxley, MD;  Location: [place] [facility];  Service: Cardiovascular;  Laterality: Right;  . LOWER EXTREMITY ANGIOGRAPHY Right 12/02/2019   Procedure: LOWER EXTREMITY ANGIOGRAPHY;  Surgeon: Katha Cabal, MD;  Location: [place] [facility];  Service: Cardiovascular;  Laterality: Right;  . LOWER EXTREMITY ANGIOGRAPHY Right 03/23/2020   Procedure: Lower Extremity Angiography;  Surgeon: Katha Cabal, MD;  Location:  [place] [facility];  Service: Cardiovascular;  Laterality: Right;  . LOWER EXTREMITY INTERVENTION  11/18/2016   Procedure: Lower Extremity Intervention;  Surgeon: Katha Cabal, MD;  Location: [place] [facility];  Service: Cardiovascular;;  . PERIPHERAL VASCULAR CATHETERIZATION Right 01/02/2015   Procedure: Lower Extremity Angiography;  Surgeon: Katha Cabal, MD;  Location: [place] [facility];  Service: Cardiovascular;  Laterality: Right;  . PERIPHERAL VASCULAR CATHETERIZATION Right 06/18/2015   Procedure: Lower Extremity Angiography;  Surgeon: Algernon Huxley, MD;  Location: [place] [facility];  Service: Cardiovascular;  Laterality: Right;  . PERIPHERAL VASCULAR CATHETERIZATION  06/18/2015   Procedure: Lower Extremity Intervention;  Surgeon: Algernon Huxley, MD;  Location: [place] [facility];  Service: Cardiovascular;;  . PERIPHERAL VASCULAR CATHETERIZATION N/A 10/09/2015   Procedure: Abdominal Aortogram w/Lower Extremity;  Surgeon: Katha Cabal, MD;  Location: [place] [facility];  Service: Cardiovascular;  Laterality: N/A;  . PERIPHERAL VASCULAR CATHETERIZATION  10/09/2015   Procedure: Lower Extremity Intervention;  Surgeon: Katha Cabal, MD;  Location: [place] [facility];  Service: Cardiovascular;;  . PERIPHERAL VASCULAR CATHETERIZATION Right 10/10/2015   Procedure: Lower Extremity Angiography;  Surgeon: Algernon Huxley, MD;  Location: [place] [facility];  Service: Cardiovascular;  Laterality: Right;  . PERIPHERAL VASCULAR CATHETERIZATION Right 10/10/2015   Procedure: Lower Extremity Intervention;  Surgeon: Algernon Huxley, MD;  Location: [place] [facility];  Service: Cardiovascular;  Laterality: Right;  . PERIPHERAL VASCULAR CATHETERIZATION Right 12/03/2015   Procedure: Lower Extremity Angiography;  Surgeon: Algernon Huxley, MD;  Location: [place] [facility];  Service: Cardiovascular;  Laterality: Right;  . PERIPHERAL VASCULAR CATHETERIZATION Right 12/04/2015   Procedure: Lower  Extremity Angiography;  Surgeon: Algernon Huxley, MD;  Location: [place] [facility];  Service: Cardiovascular;  Laterality: Right;  . PERIPHERAL VASCULAR CATHETERIZATION  12/04/2015   Procedure: Lower Extremity Intervention;  Surgeon: Algernon Huxley, MD;  Location: [place] [facility];  Service: Cardiovascular;;  . PERIPHERAL VASCULAR CATHETERIZATION Right 06/30/2016   Procedure: Lower Extremity Angiography;  Surgeon: Algernon Huxley, MD;  Location: [place] [facility];  Service: Cardiovascular;  Laterality: Right;  . PERIPHERAL VASCULAR CATHETERIZATION Right 06/25/2016   Procedure: Lower Extremity Angiography;  Surgeon: Katha Cabal, MD;  Location: [place] [facility];  Service: Cardiovascular;  Laterality: Right;  . PERIPHERAL VASCULAR CATHETERIZATION Right 07/01/2016   Procedure: Lower Extremity Angiography;  Surgeon: Katha Cabal, MD;  Location: [place] [facility];  Service: Cardiovascular;  Laterality: Right;  . PERIPHERAL VASCULAR CATHETERIZATION  07/01/2016   Procedure: Lower Extremity Intervention;  Surgeon: Katha Cabal, MD;  Location: [place] [facility];  Service: Cardiovascular;;  . PERIPHERAL VASCULAR CATHETERIZATION Right 08/01/2016   Procedure: Lower Extremity Angiography;  Surgeon: Katha Cabal, MD;  Location: [place] [facility];  Service: Cardiovascular;  Laterality: Right;  . stent placement in right leg Right    Patient Active Problem List   Diagnosis Date Noted  . Goals of care, counseling/discussion 08/30/2020  .  Iron deficiency anemia 08/30/2020  . Lung mass 08/30/2020  . Ischemia of extremity 03/23/2020  . Cervical myelopathy ([place]) 03/14/2020  . Ischemia of right lower extremity 11/15/2019  . Grief 10/14/2019  . Peripheral neuropathy 10/14/2019  . Chronic left-sided low back pain without sciatica 04/13/2019  . History of back surgery 04/13/2019  . Overweight 04/13/2019  . Atherosclerosis of lower extremity with claudication ([place]) 11/18/2016  .  Complication associated with vascular device 11/13/2016  . Hyperlipidemia 06/18/2016  . Hypertension 06/18/2016  . Radiculopathy of lumbar region 06/10/2016  . PVD (peripheral vascular disease) ([place]) 05/08/2016  . 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 Prediabetes 08/25/2014   Family History  Problem Relation Age of Onset  . Diabetes Mother   . Hypertension Mother   . Heart murmur Mother   . Leukemia Mother   . Throat cancer Maternal Grandmother   . Colon cancer Neg Hx   . Breast cancer Neg Hx    Social History   Tobacco Use  . Smoking status: Former Smoker    Packs/day: 2.00    Years: 38.00    Pack years: 76.00    Types: Cigarettes    Quit date: 12/20/2014    Years since quitting: 5.7  . Smokeless tobacco: Former Network engineer Use Topics  . Alcohol use: Not Currently   No Known Allergies   Current Meds  Medication Sig  . acetaminophen (TYLENOL) 650 MG CR tablet Take 1,300 mg by mouth every 8 (eight) hours as needed for pain.   . Budeson-Glycopyrrol-Formoterol (BREZTRI AEROSPHERE) 160-9-4.8 MCG/ACT AERO Inhale 2 puffs into the lungs in the morning and at bedtime.  . clopidogrel (PLAVIX) 75 MG tablet Take 1 tablet (75 mg total) by mouth daily at 6 (six) AM.  . ELIQUIS 5 MG TABS tablet TAKE 1 TABLET BY MOUTH  TWICE DAILY  . lisinopril (ZESTRIL) 10 MG tablet TAKE 1 TABLET BY MOUTH  DAILY  . simvastatin (ZOCOR) 40 MG tablet Take 1 tablet (40 mg total) by mouth at bedtime.   Immunization History  Administered Date(s) Administered  . Influenza,inj,Quad PF,6+ Mos 09/14/2016, 04/13/2019, 04/16/2020  . PFIZER(Purple Top)SARS-COV-2 Vaccination 10/20/2019, 11/29/2019  . Tdap 04/16/2020       Objective:   Physical Exam BP 136/78 (BP Location: Left Arm, Cuff Size: Normal)   Pulse 99   Temp 98.2 F (36.8 C) (Temporal)   Ht 6' (1.829 m)   Wt 199 lb 3.2 oz (90.4 kg)   SpO2  98%   BMI 27.02 kg/m  GENERAL: Well-developed well-nourished gentleman, no acute distress.  Walks with assistance of a cane. HEAD: Normocephalic, atraumatic.  EYES: Pupils equal, round, reactive to light.  No scleral icterus.  MOUTH: Nose/mouth/throat not examined due to masking requirements for COVID 19. NECK: Supple.  Limited ROM due to prior spinal fusion.  No thyromegaly. Trachea midline. No JVD.  No adenopathy. PULMONARY: Good air entry bilaterally.  Fixed monophonic wheeze on the right midlung.  Mild scattered end expiratory wheezes. CARDIOVASCULAR: S1 and S2. Regular rate and rhythm.  No rubs, murmurs or gallops heard. ABDOMEN: Benign. MUSCULOSKELETAL: No joint deformity, no clubbing, no edema.  NEUROLOGIC: No overt focal deficit, walks with assistance of a cane, speech is fluent. SKIN: Intact,warm,dry. PSYCH: Mood and behavior normal.    Assessment & Plan:     ICD-10-CM   1. Lung mass  R91.8    Carcinoma until proven  otherwise Appears to arise from the right mainstem bronchus Will need bronchoscopy with EBUS  2. Mediastinal adenopathy  R59.0    Will need sampling with endobronchial ultrasound  3. Hemoptysis  R04.2    Plan for cryotherapy to prevent further hemoptysis Likely arising from right lung mass  4. COPD suggested by initial evaluation ([place])  J44.9    Trial of Breztri 2 puffs twice a day Will need PFTs in the future  5. On continuous oral anticoagulation  Z79.01    For peripheral vascular disease Okay per Dr. Delana Meyer to hold for procedure Hold Plavix 5 days before procedure Hold Eliquis 3 days before procedure   Ordered: Bronchoscopy with endobronchial ultrasound and spray cryotherapy and possible balloon dilation for diagnosis and management of right lung mass, mediastinal adenopathy and hemoptysis.  Procedure time and date 25 August 2020, 12:15 PM.  Meds ordered this encounter  Medications  . Budeson-Glycopyrrol-Formoterol (BREZTRI AEROSPHERE) 160-9-4.8  MCG/ACT AERO    Sig: Inhale 2 puffs into the lungs in the morning and at bedtime.    Dispense:  10.7 g    Refill:  0    Order Specific Question:   Lot Number?    Answer:   [id number]    Order Specific Question:   Expiration Date?    Answer:   02/18/2022    Order Specific Question:   Manufacturer?    Answer:   [employer] [[id number]]    Order Specific Question:   Quantity    Answer:   1   Discussion:  Patient has a right lung mass with right hilar adenopathy.  He has had an episode of hemoptysis.  There is a fixed monophonic wheeze on the right midlung field indicating that the patient may need balloon dilation due to early stenosis of the right mainstem bronchus.  Proposed procedure as above.  Benefits, limitations and potential complications of the procedure were discussed with the patient/family  including, but not limited to bleeding, hemoptysis, respiratory failure requiring intubation and/or prolongued mechanical ventilation, infection, pneumothorax (collapse of lung) requiring chest tube placement, stroke from nitrogen embolism or even death.  Patient agrees to proceed.  We will see the patient in follow-up in 3 to 4 weeks after the procedure.  Renold Don, MD [facility]   *This note was dictated using voice recognition software/Dragon.  Despite best efforts to proofread, errors can occur which can change the meaning.  Any change was purely unintentional.

## 2020-09-05 NOTE — Telephone Encounter (Addendum)
Patient has been scheduled for bronch with EBUS on 09/14/2020 at 12:00 BB:JXFF mass CPT:[id number]  Rodena Piety, please see bronch info

## 2020-09-05 NOTE — Patient Instructions (Signed)
You are scheduled for a procedure on 14 September 2020 this will be to diagnose the findings your lung.  This will include a bronchoscopy (a scope to look into your lungs).  You will be under general anesthesia for this.   To stop your Plavix 5 days before the procedure and Eliquis 3 days prior to the procedure.   We are going to give you a trial of Breztri 2 puffs twice a day.  Rinse your mouth well after you use it.   We will see you in follow-up and 3 to 4 weeks time after the procedure call sooner should any new problems arise.

## 2020-09-05 NOTE — Progress Notes (Signed)
Subjective:    Patient ID: Joshua [name], male    DOB: 02-23-1956, 65 y.o.   MRN: [id number]  HPI Patient is a 65 year old male former smoker (quit 2016) who presents for evaluation of a right lung mass and right hilar adenopathy.  He is kindly referred by Dr. Tasia Catchings.  His primary care provider is Lavon Paganini, MD.  The patient presented to the emergency room on 25 August 2020 with a complaint of hemoptysis.  He had been feeling for 5 to 6 days prior to that visit that he had something "stuck in the chest "that he could not cough out.  He then coughed up a large clot of blood which prompted his visit to the emergency room.  He is on both Plavix and Eliquis for peripheral vascular stents.  He has not had any fevers, chills or sweats.  No purulent sputum.  No weight loss or anorexia.  He has not had any dyspnea, orthopnea or PND.  He does note that when he is supine his symptoms of congestion worsen and has the urge to cough.  He has not had any chest pain but does have the aforementioned chest fullness.  A CT scan of the chest was performed which showed a peribronchitic mass on the right as well as right hilar adenopathy.  He was then referred to oncology for further evaluation oncology has referred to [place] for diagnostic procedure.  Patient voices no other complaint today.  His hemoptysis has subsided.  He was treated with steroids and antibiotics.  He still notes occasional wheezing particularly when supine.   Review of Systems A 10 point review of systems was performed and it is as noted above otherwise negative.  Past Medical History:  Diagnosis Date  . Allergy   . Dyspnea    former smoker. only doe  . History of kidney stones   . Hypertension   . Peripheral vascular disease [facility])    Past Surgical History:  Procedure Laterality Date  . ANTERIOR CERVICAL DECOMP/DISCECTOMY FUSION N/A 03/14/2020   Procedure: ANTERIOR CERVICAL DECOMPRESSION/DISCECTOMY FUSION 3 LEVELS C4-7;  Surgeon:  Meade Maw, MD;  Location: [facility] ORS;  Service: Neurosurgery;  Laterality: N/A;  . BACK SURGERY  2011  . CENTRAL LINE INSERTION Right 09/10/2016   Procedure: CENTRAL LINE INSERTION;  Surgeon: Katha Cabal, MD;  Location: [facility] ORS;  Service: Vascular;  Laterality: Right;  . CERVICAL FUSION     C4-c7 on Mar 14, 2020  . COLONOSCOPY  2013 ?  . FEMORAL-POPLITEAL BYPASS GRAFT Right 09/10/2016   Procedure: BYPASS GRAFT FEMORAL-POPLITEAL ARTERY ( BELOW KNEE );  Surgeon: Katha Cabal, MD;  Location: [facility] ORS;  Service: Vascular;  Laterality: Right;  . FEMORAL-TIBIAL BYPASS GRAFT Right 03/23/2020   Procedure: BYPASS GRAFT RIGHT FEMORAL- DISTAL TIBIAL ARTERY WITH DISTAL FLOW GRAFT;  Surgeon: Katha Cabal, MD;  Location: [facility] ORS;  Service: Vascular;  Laterality: Right;  . LOWER EXTREMITY ANGIOGRAPHY Right 11/18/2016   Procedure: Lower Extremity Angiography;  Surgeon: Katha Cabal, MD;  Location: [place] [facility];  Service: Cardiovascular;  Laterality: Right;  . LOWER EXTREMITY ANGIOGRAPHY Right 12/09/2016   Procedure: Lower Extremity Angiography;  Surgeon: Katha Cabal, MD;  Location: [place] [facility];  Service: Cardiovascular;  Laterality: Right;  . LOWER EXTREMITY ANGIOGRAPHY Right 11/15/2019   Procedure: LOWER EXTREMITY ANGIOGRAPHY;  Surgeon: Katha Cabal, MD;  Location: [place] [facility];  Service: Cardiovascular;  Laterality: Right;  . LOWER EXTREMITY ANGIOGRAPHY Right  12/01/2019   Procedure: Lower Extremity Angiography;  Surgeon: Algernon Huxley, MD;  Location: [place] [facility];  Service: Cardiovascular;  Laterality: Right;  . LOWER EXTREMITY ANGIOGRAPHY Right 12/02/2019   Procedure: LOWER EXTREMITY ANGIOGRAPHY;  Surgeon: Katha Cabal, MD;  Location: [place] [facility];  Service: Cardiovascular;  Laterality: Right;  . LOWER EXTREMITY ANGIOGRAPHY Right 03/23/2020   Procedure: Lower Extremity Angiography;  Surgeon: Katha Cabal, MD;  Location:  [place] [facility];  Service: Cardiovascular;  Laterality: Right;  . LOWER EXTREMITY INTERVENTION  11/18/2016   Procedure: Lower Extremity Intervention;  Surgeon: Katha Cabal, MD;  Location: [place] [facility];  Service: Cardiovascular;;  . PERIPHERAL VASCULAR CATHETERIZATION Right 01/02/2015   Procedure: Lower Extremity Angiography;  Surgeon: Katha Cabal, MD;  Location: [place] [facility];  Service: Cardiovascular;  Laterality: Right;  . PERIPHERAL VASCULAR CATHETERIZATION Right 06/18/2015   Procedure: Lower Extremity Angiography;  Surgeon: Algernon Huxley, MD;  Location: [place] [facility];  Service: Cardiovascular;  Laterality: Right;  . PERIPHERAL VASCULAR CATHETERIZATION  06/18/2015   Procedure: Lower Extremity Intervention;  Surgeon: Algernon Huxley, MD;  Location: [place] [facility];  Service: Cardiovascular;;  . PERIPHERAL VASCULAR CATHETERIZATION N/A 10/09/2015   Procedure: Abdominal Aortogram w/Lower Extremity;  Surgeon: Katha Cabal, MD;  Location: [place] [facility];  Service: Cardiovascular;  Laterality: N/A;  . PERIPHERAL VASCULAR CATHETERIZATION  10/09/2015   Procedure: Lower Extremity Intervention;  Surgeon: Katha Cabal, MD;  Location: [place] [facility];  Service: Cardiovascular;;  . PERIPHERAL VASCULAR CATHETERIZATION Right 10/10/2015   Procedure: Lower Extremity Angiography;  Surgeon: Algernon Huxley, MD;  Location: [place] [facility];  Service: Cardiovascular;  Laterality: Right;  . PERIPHERAL VASCULAR CATHETERIZATION Right 10/10/2015   Procedure: Lower Extremity Intervention;  Surgeon: Algernon Huxley, MD;  Location: [place] [facility];  Service: Cardiovascular;  Laterality: Right;  . PERIPHERAL VASCULAR CATHETERIZATION Right 12/03/2015   Procedure: Lower Extremity Angiography;  Surgeon: Algernon Huxley, MD;  Location: [place] [facility];  Service: Cardiovascular;  Laterality: Right;  . PERIPHERAL VASCULAR CATHETERIZATION Right 12/04/2015   Procedure: Lower  Extremity Angiography;  Surgeon: Algernon Huxley, MD;  Location: [place] [facility];  Service: Cardiovascular;  Laterality: Right;  . PERIPHERAL VASCULAR CATHETERIZATION  12/04/2015   Procedure: Lower Extremity Intervention;  Surgeon: Algernon Huxley, MD;  Location: [place] [facility];  Service: Cardiovascular;;  . PERIPHERAL VASCULAR CATHETERIZATION Right 06/30/2016   Procedure: Lower Extremity Angiography;  Surgeon: Algernon Huxley, MD;  Location: [place] [facility];  Service: Cardiovascular;  Laterality: Right;  . PERIPHERAL VASCULAR CATHETERIZATION Right 06/25/2016   Procedure: Lower Extremity Angiography;  Surgeon: Katha Cabal, MD;  Location: [place] [facility];  Service: Cardiovascular;  Laterality: Right;  . PERIPHERAL VASCULAR CATHETERIZATION Right 07/01/2016   Procedure: Lower Extremity Angiography;  Surgeon: Katha Cabal, MD;  Location: [place] [facility];  Service: Cardiovascular;  Laterality: Right;  . PERIPHERAL VASCULAR CATHETERIZATION  07/01/2016   Procedure: Lower Extremity Intervention;  Surgeon: Katha Cabal, MD;  Location: [place] [facility];  Service: Cardiovascular;;  . PERIPHERAL VASCULAR CATHETERIZATION Right 08/01/2016   Procedure: Lower Extremity Angiography;  Surgeon: Katha Cabal, MD;  Location: [place] [facility];  Service: Cardiovascular;  Laterality: Right;  . stent placement in right leg Right    Patient Active Problem List   Diagnosis Date Noted  . Goals of care, counseling/discussion 08/30/2020  .  Iron deficiency anemia 08/30/2020  . Lung mass 08/30/2020  . Ischemia of extremity 03/23/2020  . Cervical myelopathy ([place]) 03/14/2020  . Ischemia of right lower extremity 11/15/2019  . Grief 10/14/2019  . Peripheral neuropathy 10/14/2019  . Chronic left-sided low back pain without sciatica 04/13/2019  . History of back surgery 04/13/2019  . Overweight 04/13/2019  . Atherosclerosis of lower extremity with claudication ([place]) 11/18/2016  .  Complication associated with vascular device 11/13/2016  . Hyperlipidemia 06/18/2016  . Hypertension 06/18/2016  . Radiculopathy of lumbar region 06/10/2016  . PVD (peripheral vascular disease) ([place]) 05/08/2016  . 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 Prediabetes 08/25/2014   Family History  Problem Relation Age of Onset  . Diabetes Mother   . Hypertension Mother   . Heart murmur Mother   . Leukemia Mother   . Throat cancer Maternal Grandmother   . Colon cancer Neg Hx   . Breast cancer Neg Hx    Social History   Tobacco Use  . Smoking status: Former Smoker    Packs/day: 2.00    Years: 38.00    Pack years: 76.00    Types: Cigarettes    Quit date: 12/20/2014    Years since quitting: 5.7  . Smokeless tobacco: Former Network engineer Use Topics  . Alcohol use: Not Currently   No Known Allergies   Current Meds  Medication Sig  . acetaminophen (TYLENOL) 650 MG CR tablet Take 1,300 mg by mouth every 8 (eight) hours as needed for pain.   . Budeson-Glycopyrrol-Formoterol (BREZTRI AEROSPHERE) 160-9-4.8 MCG/ACT AERO Inhale 2 puffs into the lungs in the morning and at bedtime.  . clopidogrel (PLAVIX) 75 MG tablet Take 1 tablet (75 mg total) by mouth daily at 6 (six) AM.  . ELIQUIS 5 MG TABS tablet TAKE 1 TABLET BY MOUTH  TWICE DAILY  . lisinopril (ZESTRIL) 10 MG tablet TAKE 1 TABLET BY MOUTH  DAILY  . simvastatin (ZOCOR) 40 MG tablet Take 1 tablet (40 mg total) by mouth at bedtime.   Immunization History  Administered Date(s) Administered  . Influenza,inj,Quad PF,6+ Mos 09/14/2016, 04/13/2019, 04/16/2020  . PFIZER(Purple Top)SARS-COV-2 Vaccination 10/20/2019, 11/29/2019  . Tdap 04/16/2020       Objective:   Physical Exam BP 136/78 (BP Location: Left Arm, Cuff Size: Normal)   Pulse 99   Temp 98.2 F (36.8 C) (Temporal)   Ht 6' (1.829 m)   Wt 199 lb 3.2 oz (90.4 kg)   SpO2  98%   BMI 27.02 kg/m  GENERAL: Well-developed well-nourished gentleman, no acute distress.  Walks with assistance of a cane. HEAD: Normocephalic, atraumatic.  EYES: Pupils equal, round, reactive to light.  No scleral icterus.  MOUTH: Nose/mouth/throat not examined due to masking requirements for COVID 19. NECK: Supple.  Limited ROM due to prior spinal fusion.  No thyromegaly. Trachea midline. No JVD.  No adenopathy. PULMONARY: Good air entry bilaterally.  Fixed monophonic wheeze on the right midlung.  Mild scattered end expiratory wheezes. CARDIOVASCULAR: S1 and S2. Regular rate and rhythm.  No rubs, murmurs or gallops heard. ABDOMEN: Benign. MUSCULOSKELETAL: No joint deformity, no clubbing, no edema.  NEUROLOGIC: No overt focal deficit, walks with assistance of a cane, speech is fluent. SKIN: Intact,warm,dry. PSYCH: Mood and behavior normal.    Assessment & Plan:     ICD-10-CM   1. Lung mass  R91.8    Carcinoma until proven  otherwise Appears to arise from the right mainstem bronchus Will need bronchoscopy with EBUS  2. Mediastinal adenopathy  R59.0    Will need sampling with endobronchial ultrasound  3. Hemoptysis  R04.2    Plan for cryotherapy to prevent further hemoptysis Likely arising from right lung mass  4. COPD suggested by initial evaluation ([place])  J44.9    Trial of Breztri 2 puffs twice a day Will need PFTs in the future  5. On continuous oral anticoagulation  Z79.01    For peripheral vascular disease Okay per Dr. Delana Meyer to hold for procedure Hold Plavix 5 days before procedure Hold Eliquis 3 days before procedure   Ordered: Bronchoscopy with endobronchial ultrasound and spray cryotherapy and possible balloon dilation for diagnosis and management of right lung mass, mediastinal adenopathy and hemoptysis.  Procedure time and date 25 August 2020, 12:15 PM.  Meds ordered this encounter  Medications  . Budeson-Glycopyrrol-Formoterol (BREZTRI AEROSPHERE) 160-9-4.8  MCG/ACT AERO    Sig: Inhale 2 puffs into the lungs in the morning and at bedtime.    Dispense:  10.7 g    Refill:  0    Order Specific Question:   Lot Number?    Answer:   [id number]    Order Specific Question:   Expiration Date?    Answer:   02/18/2022    Order Specific Question:   Manufacturer?    Answer:   [employer] [[id number]]    Order Specific Question:   Quantity    Answer:   1   Discussion:  Patient has a right lung mass with right hilar adenopathy.  He has had an episode of hemoptysis.  There is a fixed monophonic wheeze on the right midlung field indicating that the patient may need balloon dilation due to early stenosis of the right mainstem bronchus.  Proposed procedure as above.  Benefits, limitations and potential complications of the procedure were discussed with the patient/family  including, but not limited to bleeding, hemoptysis, respiratory failure requiring intubation and/or prolongued mechanical ventilation, infection, pneumothorax (collapse of lung) requiring chest tube placement, stroke from nitrogen embolism or even death.  Patient agrees to proceed.  We will see the patient in follow-up in 3 to 4 weeks after the procedure.  Renold Don, MD [facility]   *This note was dictated using voice recognition software/Dragon.  Despite best efforts to proofread, errors can occur which can change the meaning.  Any change was purely unintentional.

## 2020-09-06 NOTE — Telephone Encounter (Signed)
Patient is aware of below dates/times. He voiced his understanding and had no further questions.  Nothing further needed.

## 2020-09-06 NOTE — Telephone Encounter (Signed)
I spoke with Tracie with [facility] Medicare and for the codes [phone], [phone], [phone], [phone] Prior Josem Kaufmann is Not required Refer # [id number]

## 2020-09-06 NOTE — Telephone Encounter (Signed)
Patient is returning phone call. Patient phone number is [phone].

## 2020-09-06 NOTE — Telephone Encounter (Signed)
Phone pre admit 09/11/20 between 1-5 and covid test 09/12/2020.  Lm for patient.

## 2020-09-07 ENCOUNTER — Encounter: Payer: Self-pay | Admitting: *Deleted

## 2020-09-10 ENCOUNTER — Other Ambulatory Visit: Payer: Self-pay | Admitting: Family Medicine

## 2020-09-11 ENCOUNTER — Other Ambulatory Visit: Payer: Self-pay

## 2020-09-11 ENCOUNTER — Other Ambulatory Visit
Admission: RE | Admit: 2020-09-11 | Discharge: 2020-09-11 | Disposition: A | Discharge status: Home / Self Care | Payer: Medicare Other | Source: Ambulatory Visit | Attending: Pulmonary Disease | Admitting: Pulmonary Disease

## 2020-09-11 DIAGNOSIS — Z01812 Encounter for preprocedural laboratory examination: Secondary | ICD-10-CM | POA: Insufficient documentation

## 2020-09-11 DIAGNOSIS — Z20822 Contact with and (suspected) exposure to covid-19: Secondary | ICD-10-CM | POA: Insufficient documentation

## 2020-09-11 HISTORY — DX: Sleep apnea, unspecified: G47.30

## 2020-09-11 NOTE — Patient Instructions (Signed)
Your procedure is scheduled on: Friday September 14, 2020. Report to Day Surgery inside [facility] 2nd floor (stop by Admissions Desk first before getting on Elevator). To find out your arrival time please call [phone] between 1PM - 3PM on Thursday September 13, 2020.  Remember: Instructions that are not followed completely may result in serious medical risk,  up to and including death, or upon the discretion of your surgeon and anesthesiologist your  surgery may need to be rescheduled.     _X__ 1. Do not eat food after midnight the night before your procedure.                 No chewing gum or hard candies. You may drink clear liquids up to 2 hours                 before you are scheduled to arrive for your surgery- DO not drink clear                 liquids within 2 hours of the start of your surgery.                 Clear Liquids include:  water, apple juice without pulp, clear Gatorade, G2 or                  Gatorade Zero (avoid Red/Purple/Blue), Black Coffee or Tea (Do not add                 anything to coffee or tea).  __X__2.  On the morning of surgery brush your teeth with toothpaste and water, you                may rinse your mouth with mouthwash if you wish.  Do not swallow any toothpaste of mouthwash.     _X__ 3.  No Alcohol for 24 hours before or after surgery.   _X__ 4.  Do Not Smoke or use e-cigarettes For 24 Hours Prior to Your Surgery.                 Do not use any chewable tobacco products for at least 6 hours prior to                 Surgery.  _X__  5.  Do not use any recreational drugs (marijuana, cocaine, heroin, ecstasy, MDMA or other)                For at least one week prior to your surgery.  Combination of these drugs with anesthesia                May have life threatening results.  __X__ 6.  Notify your doctor if there is any change in your medical condition      (cold, fever, infections).     Do not wear jewelry, make-up,  hairpins, clips or nail polish. Do not wear lotions, powders, or perfumes. You may wear deodorant. Do not shave 48 hours prior to surgery. Men may shave face and neck. Do not bring valuables to the hospital.    [facility] is not responsible for any belongings or valuables.  Contacts, dentures or bridgework may not be worn into surgery. Leave your suitcase in the car. After surgery it may be brought to your room. For patients admitted to the hospital, discharge time is determined by your treatment team.   Patients discharged the day of surgery will not be allowed to drive  home.   Make arrangements for someone to be with you for the first 24 hours of your Same Day Discharge.   __X__ Take these medicines the morning of surgery with A SIP OF WATER:    1. None   ____ Fleet Enema (as directed)   ____ Use CHG Soap (or wipes) as directed  ____ Use Benzoyl Peroxide Gel as instructed  ____ Use inhalers on the day of surgery  ____ Stop metformin 2 days prior to surgery    ____ Take 1/2 of usual insulin dose the night before surgery. No insulin the morning          of surgery.   __X__ Stop clopidogrel (PLAVIX) 75 MG and ELIQUIS 5 MG as instructed by your Doctor.   __X__ Stop Anti-inflammatories such as Ibuprofen, Aleve, Advil, naproxen and or BC powders.   __X__ Stop supplements until after surgery.    __X__ Do not start any herbal supplements before your procedure.   If you have any questions regarding your pre-procedure instructions,  Please call Pre-admit Testing at [phone].

## 2020-09-12 ENCOUNTER — Other Ambulatory Visit
Admission: RE | Admit: 2020-09-12 | Discharge: 2020-09-12 | Disposition: A | Discharge status: Home / Self Care | Payer: Medicare Other | Source: Ambulatory Visit | Attending: Pulmonary Disease | Admitting: Pulmonary Disease

## 2020-09-12 DIAGNOSIS — Z20822 Contact with and (suspected) exposure to covid-19: Secondary | ICD-10-CM | POA: Diagnosis not present

## 2020-09-12 DIAGNOSIS — Z01812 Encounter for preprocedural laboratory examination: Secondary | ICD-10-CM | POA: Diagnosis not present

## 2020-09-12 LAB — SARS CORONAVIRUS 2 (TAT 6-24 HRS): SARS Coronavirus 2: NEGATIVE

## 2020-09-14 ENCOUNTER — Ambulatory Visit
Admission: RE | Admit: 2020-09-14 | Discharge: 2020-09-14 | Disposition: A | Discharge status: Home / Self Care | Payer: Medicare Other | Attending: Pulmonary Disease | Admitting: Pulmonary Disease

## 2020-09-14 ENCOUNTER — Ambulatory Visit: Payer: Medicare Other | Admitting: Urgent Care

## 2020-09-14 ENCOUNTER — Encounter
Admission: RE | Disposition: A | Discharge status: Home / Self Care | Payer: Self-pay | Source: Home / Self Care | Attending: Pulmonary Disease

## 2020-09-14 ENCOUNTER — Ambulatory Visit: Payer: Medicare Other

## 2020-09-14 ENCOUNTER — Other Ambulatory Visit: Payer: Self-pay

## 2020-09-14 ENCOUNTER — Encounter: Payer: Self-pay | Admitting: Pulmonary Disease

## 2020-09-14 DIAGNOSIS — Z806 Family history of leukemia: Secondary | ICD-10-CM | POA: Diagnosis not present

## 2020-09-14 DIAGNOSIS — R7303 Prediabetes: Secondary | ICD-10-CM | POA: Insufficient documentation

## 2020-09-14 DIAGNOSIS — I10 Essential (primary) hypertension: Secondary | ICD-10-CM | POA: Diagnosis not present

## 2020-09-14 DIAGNOSIS — R590 Localized enlarged lymph nodes: Secondary | ICD-10-CM | POA: Diagnosis not present

## 2020-09-14 DIAGNOSIS — Z79899 Other long term (current) drug therapy: Secondary | ICD-10-CM | POA: Diagnosis not present

## 2020-09-14 DIAGNOSIS — Z7902 Long term (current) use of antithrombotics/antiplatelets: Secondary | ICD-10-CM | POA: Diagnosis not present

## 2020-09-14 DIAGNOSIS — E785 Hyperlipidemia, unspecified: Secondary | ICD-10-CM | POA: Diagnosis not present

## 2020-09-14 DIAGNOSIS — R042 Hemoptysis: Secondary | ICD-10-CM | POA: Insufficient documentation

## 2020-09-14 DIAGNOSIS — I70219 Atherosclerosis of native arteries of extremities with intermittent claudication, unspecified extremity: Secondary | ICD-10-CM | POA: Diagnosis not present

## 2020-09-14 DIAGNOSIS — Z87891 Personal history of nicotine dependence: Secondary | ICD-10-CM | POA: Diagnosis not present

## 2020-09-14 DIAGNOSIS — G629 Polyneuropathy, unspecified: Secondary | ICD-10-CM | POA: Diagnosis not present

## 2020-09-14 DIAGNOSIS — J9809 Other diseases of bronchus, not elsewhere classified: Secondary | ICD-10-CM

## 2020-09-14 DIAGNOSIS — Z7689 Persons encountering health services in other specified circumstances: Secondary | ICD-10-CM | POA: Diagnosis not present

## 2020-09-14 DIAGNOSIS — Z86718 Personal history of other venous thrombosis and embolism: Secondary | ICD-10-CM | POA: Diagnosis not present

## 2020-09-14 DIAGNOSIS — D509 Iron deficiency anemia, unspecified: Secondary | ICD-10-CM | POA: Diagnosis not present

## 2020-09-14 DIAGNOSIS — C3412 Malignant neoplasm of upper lobe, left bronchus or lung: Secondary | ICD-10-CM | POA: Insufficient documentation

## 2020-09-14 DIAGNOSIS — Z9889 Other specified postprocedural states: Secondary | ICD-10-CM

## 2020-09-14 DIAGNOSIS — R918 Other nonspecific abnormal finding of lung field: Secondary | ICD-10-CM | POA: Diagnosis not present

## 2020-09-14 DIAGNOSIS — Z800 Family history of malignant neoplasm of digestive organs: Secondary | ICD-10-CM | POA: Insufficient documentation

## 2020-09-14 DIAGNOSIS — Z8249 Family history of ischemic heart disease and other diseases of the circulatory system: Secondary | ICD-10-CM | POA: Insufficient documentation

## 2020-09-14 DIAGNOSIS — C3491 Malignant neoplasm of unspecified part of right bronchus or lung: Secondary | ICD-10-CM | POA: Diagnosis not present

## 2020-09-14 DIAGNOSIS — C3411 Malignant neoplasm of upper lobe, right bronchus or lung: Secondary | ICD-10-CM | POA: Diagnosis not present

## 2020-09-14 DIAGNOSIS — Z7901 Long term (current) use of anticoagulants: Secondary | ICD-10-CM | POA: Insufficient documentation

## 2020-09-14 DIAGNOSIS — Z833 Family history of diabetes mellitus: Secondary | ICD-10-CM | POA: Insufficient documentation

## 2020-09-14 DIAGNOSIS — I739 Peripheral vascular disease, unspecified: Secondary | ICD-10-CM | POA: Diagnosis not present

## 2020-09-14 HISTORY — PX: VIDEO BRONCHOSCOPY WITH ENDOBRONCHIAL ULTRASOUND: SHX6177

## 2020-09-14 HISTORY — PX: VIDEO BRONCHOSCOPY: SHX5072

## 2020-09-14 SURGERY — BRONCHOSCOPY, WITH FLUOROSCOPY
Anesthesia: General

## 2020-09-14 MED ORDER — PHENYLEPHRINE HCL (PRESSORS) 10 MG/ML IV SOLN
INTRAVENOUS | Status: DC | PRN
  Administered 2020-09-14: 100 ug via INTRAVENOUS
  Administered 2020-09-14 (×2): 150 ug via INTRAVENOUS

## 2020-09-14 MED ORDER — DEXMEDETOMIDINE (PRECEDEX) IN NS 20 MCG/5ML (4 MCG/ML) IV SYRINGE
PREFILLED_SYRINGE | INTRAVENOUS | Status: AC
  Filled 2020-09-14: qty 5

## 2020-09-14 MED ORDER — DEXAMETHASONE SODIUM PHOSPHATE 10 MG/ML IJ SOLN
INTRAMUSCULAR | Status: DC | PRN
  Administered 2020-09-14: 8 mg via INTRAVENOUS

## 2020-09-14 MED ORDER — LIDOCAINE HCL (PF) 2 % IJ SOLN
INTRAMUSCULAR | Status: AC
  Filled 2020-09-14: qty 5

## 2020-09-14 MED ORDER — SODIUM CHLORIDE 0.9 % IV SOLN: Freq: Once | INTRAVENOUS | Status: AC

## 2020-09-14 MED ORDER — EPHEDRINE 5 MG/ML INJ
INTRAVENOUS | Status: AC
  Filled 2020-09-14: qty 10

## 2020-09-14 MED ORDER — MIDAZOLAM HCL 2 MG/2ML IJ SOLN
INTRAMUSCULAR | Status: AC
  Filled 2020-09-14: qty 2

## 2020-09-14 MED ORDER — ONDANSETRON HCL 4 MG/2ML IJ SOLN: 4.0000 mg | Freq: Once | INTRAMUSCULAR | Status: DC | PRN

## 2020-09-14 MED ORDER — ROCURONIUM BROMIDE 100 MG/10ML IV SOLN
INTRAVENOUS | Status: DC | PRN
  Administered 2020-09-14: 60 mg via INTRAVENOUS
  Administered 2020-09-14 (×2): 10 mg via INTRAVENOUS

## 2020-09-14 MED ORDER — MIDAZOLAM HCL 2 MG/2ML IJ SOLN
INTRAMUSCULAR | Status: DC | PRN
  Administered 2020-09-14: 2 mg via INTRAVENOUS

## 2020-09-14 MED ORDER — FAMOTIDINE 20 MG PO TABS
ORAL_TABLET | ORAL | Status: AC
  Filled 2020-09-14: qty 1

## 2020-09-14 MED ORDER — FENTANYL CITRATE (PF) 100 MCG/2ML IJ SOLN: 25.0000 ug | INTRAMUSCULAR | Status: DC | PRN

## 2020-09-14 MED ORDER — ONDANSETRON HCL 4 MG/2ML IJ SOLN
INTRAMUSCULAR | Status: AC
  Filled 2020-09-14: qty 2

## 2020-09-14 MED ORDER — ONDANSETRON HCL 4 MG/2ML IJ SOLN
INTRAMUSCULAR | Status: DC | PRN
  Administered 2020-09-14: 4 mg via INTRAVENOUS

## 2020-09-14 MED ORDER — CHLORHEXIDINE GLUCONATE 0.12 % MT SOLN
OROMUCOSAL | Status: AC
  Filled 2020-09-14: qty 15

## 2020-09-14 MED ORDER — CHLORHEXIDINE GLUCONATE 0.12 % MT SOLN
15.0000 mL | Freq: Once | OROMUCOSAL | Status: AC
  Administered 2020-09-14: 15 mL via OROMUCOSAL

## 2020-09-14 MED ORDER — LACTATED RINGERS IV SOLN: INTRAVENOUS | Status: DC

## 2020-09-14 MED ORDER — SUGAMMADEX SODIUM 200 MG/2ML IV SOLN
INTRAVENOUS | Status: DC | PRN
  Administered 2020-09-14: 200 mg via INTRAVENOUS

## 2020-09-14 MED ORDER — FENTANYL CITRATE (PF) 100 MCG/2ML IJ SOLN
INTRAMUSCULAR | Status: AC
  Filled 2020-09-14: qty 2

## 2020-09-14 MED ORDER — ROCURONIUM BROMIDE 10 MG/ML (PF) SYRINGE
PREFILLED_SYRINGE | INTRAVENOUS | Status: AC
  Filled 2020-09-14: qty 10

## 2020-09-14 MED ORDER — EPHEDRINE SULFATE 50 MG/ML IJ SOLN
INTRAMUSCULAR | Status: DC | PRN
  Administered 2020-09-14: 10 mg via INTRAVENOUS
  Administered 2020-09-14 (×3): 5 mg via INTRAVENOUS

## 2020-09-14 MED ORDER — PROPOFOL 10 MG/ML IV BOLUS
INTRAVENOUS | Status: DC | PRN
  Administered 2020-09-14: 180 mg via INTRAVENOUS

## 2020-09-14 MED ORDER — PROPOFOL 10 MG/ML IV BOLUS
INTRAVENOUS | Status: AC
  Filled 2020-09-14: qty 20

## 2020-09-14 MED ORDER — FAMOTIDINE 20 MG PO TABS
20.0000 mg | ORAL_TABLET | Freq: Once | ORAL | Status: AC
  Administered 2020-09-14: 20 mg via ORAL

## 2020-09-14 MED ORDER — DEXMEDETOMIDINE HCL 200 MCG/2ML IV SOLN
INTRAVENOUS | Status: DC | PRN
  Administered 2020-09-14: 8 ug via INTRAVENOUS
  Administered 2020-09-14: 12 ug via INTRAVENOUS

## 2020-09-14 MED ORDER — KETAMINE HCL 50 MG/5ML IJ SOSY
PREFILLED_SYRINGE | INTRAMUSCULAR | Status: AC
  Filled 2020-09-14: qty 5

## 2020-09-14 MED ORDER — DEXAMETHASONE SODIUM PHOSPHATE 10 MG/ML IJ SOLN
INTRAMUSCULAR | Status: AC
  Filled 2020-09-14: qty 1

## 2020-09-14 MED ORDER — FENTANYL CITRATE (PF) 100 MCG/2ML IJ SOLN
INTRAMUSCULAR | Status: DC | PRN
  Administered 2020-09-14: 50 ug via INTRAVENOUS
  Administered 2020-09-14 (×2): 25 ug via INTRAVENOUS

## 2020-09-14 MED ORDER — LIDOCAINE HCL (CARDIAC) PF 100 MG/5ML IV SOSY
PREFILLED_SYRINGE | INTRAVENOUS | Status: DC | PRN
  Administered 2020-09-14: 20 mg via INTRAVENOUS
  Administered 2020-09-14: 80 mg via INTRAVENOUS

## 2020-09-14 MED ORDER — ORAL CARE MOUTH RINSE: 15.0000 mL | Freq: Once | OROMUCOSAL | Status: AC

## 2020-09-14 NOTE — Discharge Instructions (Signed)
AMBULATORY SURGERY  DISCHARGE INSTRUCTIONS   1) The drugs that you were given will stay in your system until tomorrow so for the next 24 hours you should not:  A) Drive an automobile B) Make any legal decisions C) Drink any alcoholic beverage   2) You may resume regular meals tomorrow.  Today it is better to start with liquids and gradually work up to solid foods.  You may eat anything you prefer, but it is better to start with liquids, then soup and crackers, and gradually work up to solid foods.   3) Please notify your doctor immediately if you have any unusual bleeding, trouble breathing, redness and pain at the surgery site, drainage, fever, or pain not relieved by medication. 4)   5) Your post-operative visit with Dr.                                     is: Date:                        Time:    Please call to schedule your post-operative visit.  6) Additional Instructions: You may start Eliquis and Plavix in the morning 09/15/2020.

## 2020-09-14 NOTE — Anesthesia Procedure Notes (Signed)
Procedure Name: Intubation Date/Time: 09/14/2020 12:24 PM Performed by: Lia Foyer, CRNA Pre-anesthesia Checklist: Patient identified, Emergency Drugs available, Suction available and Patient being monitored Patient Re-evaluated:Patient Re-evaluated prior to induction Oxygen Delivery Method: Circle system utilized Preoxygenation: Pre-oxygenation with 100% oxygen Induction Type: IV induction Ventilation: Mask ventilation without difficulty Laryngoscope Size: McGraph and 4 Grade View: Grade I Tube type: Oral Tube size: 8.5 mm Number of attempts: 1 Airway Equipment and Method: Stylet and Video-laryngoscopy Placement Confirmation: ETT inserted through vocal cords under direct vision,  positive ETCO2 and breath sounds checked- equal and bilateral Secured at: 22 cm Tube secured with: Tape Dental Injury: Teeth and Oropharynx as per pre-operative assessment

## 2020-09-14 NOTE — Op Note (Signed)
PROCEDURES:   BRONCHOSCOPY   TUMOR DESTRUCTION WITH SPRAY CRYOTHERAPY  BALLOON DILATION OF BRONCHUS  ENDOBRONCHIAL ULTRASOUND WITH TBNA  PROCEDURE DATE: 09/14/2020  TIME:  NAME:  Joshua [name] ROUTE  DOB:05/23/1956  MRN: [id number] LOC:  ARPO/None    HOSP DAY: _0 @ CODE STATUS:  FULL  Indications/Preliminary Diagnosis:   Consent: (Place X beside choice/s below)  The benefits, risks and possible complications of the procedure were        explained to:  _X__ patient  ___ patient's family  ___ other:___________  who verbalized understanding and gave:  ___ verbal  ___ written  _X__ verbal and written  ___ telephone  ___ other:________ consent.      Unable to obtain consent; procedure performed on emergent basis.     Other:    Benefits, limitations and potential complications of the procedure were discussed with the patient/family  including, but not limited to bleeding, hemoptysis, respiratory failure requiring intubation and/or prolongued mechanical ventilation, infection, pneumothorax (collapse of lung) requiring chest tube placement, stroke from air/nitrogen embolism or even death.  Patient agreed to proceed.   Anesthesia type: General endotracheal  Surgeon: Renold Don, MD Assistant/Scrub: Frederich Cha, RRT Circulator: Annia Belt, RRT Anesthesiologist/CRNA: Vashti Hey, MD/Michael Delice Bison, CRNA ROSE available?:  Yes, [facility] Vendor representative: Three Jones, [employer]/CSA    PROCEDURE DETAILS:  The patient was taken to Procedure Room 2 (Bronchoscopy Suite) in the OR area.  Appropriate timeout was performed and correct patient, name,ID and laterality confirmed.  The patient was inducted under general anesthesia by the anesthesiology team.  Patient was intubated with an 8.5 ET tube without difficulty.  A Portex adapter was placed on the endotracheal tube.  The Olympus video therapeutic bronchoscope was then advanced through the existing Portex adapter  once the patient was under adequate general anesthesia.  At this point anatomic survey with thorough airway exam was performed.  The visible trachea was normal, carina was sharp.  Examination of the left tracheobronchial tree was done first.  The left upper lobe, lingula and left lower lobe bronchi were all patent and without any endobronchial lesions.  Bronchoscope was then brought to the right mainstem bronchus and the origin of the right upper lobe bronchus there was a mass which appeared to be quite vascular.  The RIGHT upper lobe bronchus itself was narrowed significantly with only a pinpoint size orifice remaining.  The remainder of the airway was examined and no endobronchial lesions were noted.  At this point attention was given to the right upper lobe process.  The patient underwent spray cryotherapy/ablation with the TruFreeze (Steris/CSA) system.  Three cycles of 5-second liquid nitrogen freeze were performed.  During the cycles balloon was deflated, chest rise monitored, ETT disconnected and breath hold performed to prevent nitrogen gas embolism.  There was a 45-second pause between cycles.  Once the cycles were performed, brushings x3 were performed on the RIGHT upper lobe lesion.  On the first brush pass ROSE identified lesional cells.  Once 3 brush passes were performed the brush was cut into CytoLyt.  At this point the lesion on the RIGHT upper lobe was then biopsied with ConMed Precisor forceps.The area was blanched with 3 mL of 1-10,000 solution of epinephrine prior to biopsy to minimize bleeding.  Bleeding overall was minimal.  The area was then treated again with 2 more cycles of 5-second liquid nitrogen freeze with theTruFreeze device.  The same precautions for passive venting were performed as above.  After this second freeze cycle  the RIGHT upper lobe bronchus was dilated to 8 mm utilizing a [employer] 02-26-09 CRE Pulmonary Balloon dilator.  Three dilations were performed each held for  30 seconds at 3 atm of pressure.  After balloon dilation the right upper lobe bronchus left patent.  Once this portion of the procedure had been completed the therapeutic bronchoscope was removed and exchanged for an Olympus endobronchial ultrasound scope (EBUS).  The EBUS scope was advanced through the existing Portex adapter.  The pretracheal and paratracheal areas were examined.  There was a large lymph node noted on the RIGHT precarinal space.  The hilar adenopathy could not be well visualized.  Attention was then placed on the RIGHT precarinal node accessibility to this note was difficult due to the tracheal rings.  Only 2 passes were able to be performed with little yield of tissue.  At this point it was elected to terminate the procedure since diagnosis could be made with the available tissue.  The patient then received 10 mL of 1% lidocaine via bronchial lavage.  Hemostasis was noted to be excellent.  The scope was then removed and the patient was allowed to emerge from general anesthesia and was extubated in the procedure room.  He was taken to the PACU in satisfactory condition.  No immediate complications were noted.  Patient tolerated the procedure well.    Insertion Route (Place X beside choice below)   Nasal   Oral  X Endotracheal Tube   Tracheostomy   Medication Amt Dose  Medication Amt Dose  Lidocaine 1% 10 mL  Epinephrine 1:10,000 sol 6 mL  Xylocaine 4%  mL   Other     TECHNICAL PROCEDURES: (Place X beside choice below)   Procedures  Description    None     Electrocautery   X Spray cryotherapy   see report, RUL  X  Balloon Dilatation  CRE 02-26-09, RUL    Bronchography     Stent Placement     Therapeutic Aspiration     Laser/Argon Plasma    Brachytherapy Catheter Placement    Foreign Body Removal      SPECIMENS (Sites): (Place X beside choice below)  Specimens Description   No Specimens Obtained     Washings    Lavage   X Biopsies  x8, RUL  X TBNA  x2, R precarinal  node  X Brushings  x3, RUL   Sputum    FINDINGS:   RUL lesion, stenotic airway   RUL bronchus stenosis 95%   RUL mass pre cryo therapy   RUL mass post cryotherapy and prebiopsy   RUL mass post biopsy, RUL bronchus post dilation, more patent   Patent RUL bronchus post cryotherapy and balloon dilation   ESTIMATED BLOOD LOSS: Less than 3 mL  COMPLICATIONS/RESOLUTION: None.  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   Renold Don, MD [facility]   *This note was dictated using voice recognition software/Dragon.  Despite best efforts to proofread, errors can occur which can change the meaning.  Any change was purely unintentional.

## 2020-09-14 NOTE — Transfer of Care (Signed)
Immediate Anesthesia Transfer of Care Note  Patient: [name]  Procedure(s) Performed: VIDEO BRONCHOSCOPY WITH FLUORO (N/A ) VIDEO BRONCHOSCOPY WITH ENDOBRONCHIAL ULTRASOUND (N/A )  Patient Location: PACU  Anesthesia Type:General  Level of Consciousness: drowsy  Airway & Oxygen Therapy: Patient Spontanous Breathing and Patient connected to face mask oxygen  Post-op Assessment: Report given to RN and Post -op Vital signs reviewed and stable  Post vital signs: Reviewed and stable  Last Vitals:  Vitals Value Taken Time  BP 146/62 09/14/20 1342  Temp    Pulse 93 09/14/20 1344  Resp 23 09/14/20 1344  SpO2 100 % 09/14/20 1344  Vitals shown include unvalidated device data.  Last Pain:  Vitals:   09/14/20 1119  TempSrc: Temporal  PainSc: 0-No pain         Complications: No complications documented.

## 2020-09-14 NOTE — Anesthesia Preprocedure Evaluation (Signed)
Anesthesia Evaluation  Patient identified by MRN, date of birth, ID band Patient awake    Reviewed: Allergy & Precautions, H&P , NPO status , Patient's Chart, lab work & pertinent test results, reviewed documented beta blocker date and time   Airway Mallampati: II  TM Distance: >3 FB Neck ROM: full    Dental  (+) Teeth Intact   Pulmonary shortness of breath, sleep apnea , former smoker,    Pulmonary exam normal        Cardiovascular Exercise Tolerance: Good hypertension, On Medications + Peripheral Vascular Disease  Normal cardiovascular exam Rhythm:regular Rate:Normal     Neuro/Psych  Neuromuscular disease negative psych ROS   GI/Hepatic negative GI ROS, Neg liver ROS,   Endo/Other  negative endocrine ROS  Renal/GU negative Renal ROS  negative genitourinary   Musculoskeletal   Abdominal   Peds  Hematology  (+) Blood dyscrasia, anemia ,   Anesthesia Other Findings Past Medical History: No date: Allergy No date: Dyspnea     Comment:  former smoker. only doe No date: History of kidney stones No date: Hypertension No date: Peripheral vascular disease (HCC) No date: Pre-diabetes No date: Sleep apnea Past Surgical History: 03/14/2020: ANTERIOR CERVICAL DECOMP/DISCECTOMY FUSION; N/A     Comment:  Procedure: ANTERIOR CERVICAL DECOMPRESSION/DISCECTOMY               FUSION 3 LEVELS C4-7;  Surgeon: Meade Maw, MD;                Location: [facility] ORS;  Service: Neurosurgery;  Laterality:               N/A; 2011: BACK SURGERY 09/10/2016: CENTRAL LINE INSERTION; Right     Comment:  Procedure: CENTRAL LINE INSERTION;  Surgeon: Katha Cabal, MD;  Location: [facility] ORS;  Service: Vascular;                Laterality: Right; No date: CERVICAL FUSION     Comment:  C4-c7 on Mar 14, 2020 2013 ?: COLONOSCOPY No date: CORONARY ANGIOPLASTY 09/10/2016: FEMORAL-POPLITEAL BYPASS GRAFT; Right     Comment:   Procedure: BYPASS GRAFT FEMORAL-POPLITEAL ARTERY ( BELOW              KNEE );  Surgeon: Katha Cabal, MD;  Location: [facility]              ORS;  Service: Vascular;  Laterality: Right; 03/23/2020: FEMORAL-TIBIAL BYPASS GRAFT; Right     Comment:  Procedure: BYPASS GRAFT RIGHT FEMORAL- DISTAL TIBIAL               ARTERY WITH DISTAL FLOW GRAFT;  Surgeon: Katha Cabal, MD;  Location: [facility] ORS;  Service: Vascular;                Laterality: Right; 11/18/2016: LOWER EXTREMITY ANGIOGRAPHY; Right     Comment:  Procedure: Lower Extremity Angiography;  Surgeon:               Katha Cabal, MD;  Location: [place] [facility];                Service: Cardiovascular;  Laterality: Right; 12/09/2016: LOWER EXTREMITY ANGIOGRAPHY; Right     Comment:  Procedure: Lower Extremity Angiography;  Surgeon:  Schnier, Dolores Lory, MD;  Location: [place] [facility];               Service: Cardiovascular;  Laterality: Right; 11/15/2019: LOWER EXTREMITY ANGIOGRAPHY; Right     Comment:  Procedure: LOWER EXTREMITY ANGIOGRAPHY;  Surgeon:               Katha Cabal, MD;  Location: [place] [facility];               Service: Cardiovascular;  Laterality: Right; 12/01/2019: LOWER EXTREMITY ANGIOGRAPHY; Right     Comment:  Procedure: Lower Extremity Angiography;  Surgeon: Algernon Huxley, MD;  Location: [place] [facility];  Service:               Cardiovascular;  Laterality: Right; 12/02/2019: LOWER EXTREMITY ANGIOGRAPHY; Right     Comment:  Procedure: LOWER EXTREMITY ANGIOGRAPHY;  Surgeon:               Katha Cabal, MD;  Location: [place] [facility];               Service: Cardiovascular;  Laterality: Right; 03/23/2020: LOWER EXTREMITY ANGIOGRAPHY; Right     Comment:  Procedure: Lower Extremity Angiography;  Surgeon:               Katha Cabal, MD;  Location: [place] [facility];               Service: Cardiovascular;  Laterality: Right; 11/18/2016: LOWER  EXTREMITY INTERVENTION     Comment:  Procedure: Lower Extremity Intervention;  Surgeon:               Katha Cabal, MD;  Location: [place] [facility];                Service: Cardiovascular;; 01/02/2015: PERIPHERAL VASCULAR CATHETERIZATION; Right     Comment:  Procedure: Lower Extremity Angiography;  Surgeon:               Katha Cabal, MD;  Location: [place] [facility];                Service: Cardiovascular;  Laterality: Right; 06/18/2015: PERIPHERAL VASCULAR CATHETERIZATION; Right     Comment:  Procedure: Lower Extremity Angiography;  Surgeon: Algernon Huxley, MD;  Location: [place] [facility];  Service:               Cardiovascular;  Laterality: Right; 06/18/2015: PERIPHERAL VASCULAR CATHETERIZATION     Comment:  Procedure: Lower Extremity Intervention;  Surgeon: Algernon Huxley, MD;  Location: [place] [facility];  Service:               Cardiovascular;; 10/09/2015: PERIPHERAL VASCULAR CATHETERIZATION; N/A     Comment:  Procedure: Abdominal Aortogram w/Lower Extremity;                Surgeon: Katha Cabal, MD;  Location: [place]               CV LAB;  Service: Cardiovascular;  Laterality: N/A; 10/09/2015: PERIPHERAL VASCULAR CATHETERIZATION     Comment:  Procedure: Lower Extremity Intervention;  Surgeon:               Dolores Lory  Schnier, MD;  Location: [place] [facility];                Service: Cardiovascular;; 10/10/2015: PERIPHERAL VASCULAR CATHETERIZATION; Right     Comment:  Procedure: Lower Extremity Angiography;  Surgeon: Algernon Huxley, MD;  Location: [place] [facility];  Service:               Cardiovascular;  Laterality: Right; 10/10/2015: PERIPHERAL VASCULAR CATHETERIZATION; Right     Comment:  Procedure: Lower Extremity Intervention;  Surgeon: Algernon Huxley, MD;  Location: [place] [facility];  Service:               Cardiovascular;  Laterality: Right; 12/03/2015: PERIPHERAL VASCULAR CATHETERIZATION;  Right     Comment:  Procedure: Lower Extremity Angiography;  Surgeon: Algernon Huxley, MD;  Location: [place] [facility];  Service:               Cardiovascular;  Laterality: Right; 12/04/2015: PERIPHERAL VASCULAR CATHETERIZATION; Right     Comment:  Procedure: Lower Extremity Angiography;  Surgeon: Algernon Huxley, MD;  Location: [place] [facility];  Service:               Cardiovascular;  Laterality: Right; 12/04/2015: PERIPHERAL VASCULAR CATHETERIZATION     Comment:  Procedure: Lower Extremity Intervention;  Surgeon: Algernon Huxley, MD;  Location: [place] [facility];  Service:               Cardiovascular;; 06/30/2016: PERIPHERAL VASCULAR CATHETERIZATION; Right     Comment:  Procedure: Lower Extremity Angiography;  Surgeon: Algernon Huxley, MD;  Location: [place] [facility];  Service:               Cardiovascular;  Laterality: Right; 06/25/2016: PERIPHERAL VASCULAR CATHETERIZATION; Right     Comment:  Procedure: Lower Extremity Angiography;  Surgeon:               Katha Cabal, MD;  Location: [place] [facility];                Service: Cardiovascular;  Laterality: Right; 07/01/2016: PERIPHERAL VASCULAR CATHETERIZATION; Right     Comment:  Procedure: Lower Extremity Angiography;  Surgeon:               Katha Cabal, MD;  Location: [place] [facility];                Service: Cardiovascular;  Laterality: Right; 07/01/2016: PERIPHERAL VASCULAR CATHETERIZATION     Comment:  Procedure: Lower Extremity Intervention;  Surgeon:               Katha Cabal, MD;  Location: [place] [facility];                Service: Cardiovascular;; 08/01/2016: PERIPHERAL VASCULAR CATHETERIZATION; Right     Comment:  Procedure: Lower Extremity Angiography;  Surgeon:               Katha Cabal, MD;  Location: [place] [facility];                Service: Cardiovascular;  Laterality: Right; No date: stent placement in right leg; Right BMI     Body Mass Index: 26.99 kg/m     Reproductive/Obstetrics negative OB ROS                             Anesthesia Physical Anesthesia Plan  ASA: III  Anesthesia Plan: General ETT   Post-op Pain Management:    Induction:   PONV Risk Score and Plan: 3  Airway Management Planned:   Additional Equipment:   Intra-op Plan:   Post-operative Plan:   Informed Consent: I have reviewed the patients History and Physical, chart, labs and discussed the procedure including the risks, benefits and alternatives for the proposed anesthesia with the patient or authorized representative who has indicated his/her understanding and acceptance.     Dental Advisory Given  Plan Discussed with: CRNA  Anesthesia Plan Comments:         Anesthesia Quick Evaluation

## 2020-09-14 NOTE — Interval H&P Note (Signed)
History and Physical Interval Note:  09/14/2020 11:52 AM  Joshua [name]  has presented today for surgery, with the diagnosis of LUNG MASS.  The various methods of treatment have been discussed with the patient and family. After consideration of risks, benefits and other options for treatment, the patient has consented to  Procedure(s): VIDEO BRONCHOSCOPY WITH FLUORO (N/A) VIDEO BRONCHOSCOPY WITH ENDOBRONCHIAL ULTRASOUND (N/A) as a surgical intervention.  The patient's history has been reviewed, patient examined, no change in status, stable for surgery.  I have reviewed the patient's chart and labs.  Questions were answered to the patient's satisfaction.     Renold Don, MD [facility]

## 2020-09-17 ENCOUNTER — Encounter: Payer: Self-pay | Admitting: Pulmonary Disease

## 2020-09-17 LAB — CYTOLOGY - NON PAP

## 2020-09-17 LAB — SURGICAL PATHOLOGY

## 2020-09-18 ENCOUNTER — Other Ambulatory Visit: Payer: Self-pay

## 2020-09-18 ENCOUNTER — Encounter
Admission: RE | Admit: 2020-09-18 | Discharge: 2020-09-18 | Disposition: A | Discharge status: Home / Self Care | Payer: Medicare Other | Source: Ambulatory Visit | Attending: Oncology | Admitting: Oncology

## 2020-09-18 DIAGNOSIS — R599 Enlarged lymph nodes, unspecified: Secondary | ICD-10-CM | POA: Insufficient documentation

## 2020-09-18 DIAGNOSIS — R918 Other nonspecific abnormal finding of lung field: Secondary | ICD-10-CM

## 2020-09-18 DIAGNOSIS — I2510 Atherosclerotic heart disease of native coronary artery without angina pectoris: Secondary | ICD-10-CM | POA: Diagnosis not present

## 2020-09-18 DIAGNOSIS — C3411 Malignant neoplasm of upper lobe, right bronchus or lung: Secondary | ICD-10-CM | POA: Diagnosis not present

## 2020-09-18 DIAGNOSIS — I700 Atherosclerosis of aorta: Secondary | ICD-10-CM | POA: Insufficient documentation

## 2020-09-18 LAB — GLUCOSE, CAPILLARY: Glucose-Capillary: 92 mg/dL (ref 70–99)

## 2020-09-18 MED ORDER — FLUDEOXYGLUCOSE F - 18 (FDG) INJECTION
10.3000 | Freq: Once | INTRAVENOUS | Status: AC | PRN
  Administered 2020-09-18: 10.57 via INTRAVENOUS

## 2020-09-19 NOTE — Anesthesia Postprocedure Evaluation (Signed)
Anesthesia Post Note  Patient: Joshua [name]  Procedure(s) Performed: VIDEO BRONCHOSCOPY WITH FLUORO (N/A ) VIDEO BRONCHOSCOPY WITH ENDOBRONCHIAL ULTRASOUND (N/A )  Patient location during evaluation: PACU Anesthesia Type: General Level of consciousness: awake and alert Pain management: pain level controlled Vital Signs Assessment: post-procedure vital signs reviewed and stable Respiratory status: spontaneous breathing, nonlabored ventilation, respiratory function stable and patient connected to nasal cannula oxygen Cardiovascular status: blood pressure returned to baseline and stable Postop Assessment: no apparent nausea or vomiting Anesthetic complications: no   No complications documented.   Last Vitals:  Vitals:   09/14/20 1416 09/14/20 1429  BP: (!) 144/75 (!) 146/60  Pulse: 79 76  Resp: 16 18  Temp: (!) 36.2 C (!) 36.1 C  SpO2: 93% 96%    Last Pain:  Vitals:   09/17/20 0845  TempSrc:   PainSc: [place] Nhia Heaphy

## 2020-09-20 ENCOUNTER — Inpatient Hospital Stay: Payer: Medicare Other | Attending: Oncology | Admitting: Oncology

## 2020-09-20 ENCOUNTER — Encounter: Payer: Self-pay | Admitting: *Deleted

## 2020-09-20 ENCOUNTER — Encounter: Payer: Self-pay | Admitting: Oncology

## 2020-09-20 VITALS — BP 132/85 | HR 87 | Temp 98.3°F | Resp 18 | Wt 199.4 lb

## 2020-09-20 DIAGNOSIS — Z7189 Other specified counseling: Secondary | ICD-10-CM | POA: Diagnosis not present

## 2020-09-20 DIAGNOSIS — Z7901 Long term (current) use of anticoagulants: Secondary | ICD-10-CM | POA: Diagnosis not present

## 2020-09-20 DIAGNOSIS — Z5111 Encounter for antineoplastic chemotherapy: Secondary | ICD-10-CM | POA: Diagnosis not present

## 2020-09-20 DIAGNOSIS — Z86718 Personal history of other venous thrombosis and embolism: Secondary | ICD-10-CM | POA: Diagnosis not present

## 2020-09-20 DIAGNOSIS — I739 Peripheral vascular disease, unspecified: Secondary | ICD-10-CM | POA: Diagnosis not present

## 2020-09-20 DIAGNOSIS — C3411 Malignant neoplasm of upper lobe, right bronchus or lung: Secondary | ICD-10-CM | POA: Insufficient documentation

## 2020-09-20 DIAGNOSIS — I743 Embolism and thrombosis of arteries of the lower extremities: Secondary | ICD-10-CM

## 2020-09-20 DIAGNOSIS — C3491 Malignant neoplasm of unspecified part of right bronchus or lung: Secondary | ICD-10-CM | POA: Diagnosis not present

## 2020-09-20 NOTE — Research (Signed)
Trial: [facility]  Patient Joshua [name] was identified by Jeral Fruit, RN, research nurse, as a potential candidate for the above listed study.  This Clinical Research Nurse met with Joshua [name], [id number], on 09/20/20 in a manner and location that ensures patient privacy to discuss participation in the above listed research study.  Patient is Unaccompanied.  A copy of the informed consent document and separate HIPAA Authorization was provided to the patient.  Patient reads, speaks, and understands [place].    Patient was provided with the business card of this Nurse and encouraged to contact the research team with any questions.  Patient was provided the option of taking informed consent documents home to review and was encouraged to review at their convenience with their support network, including other care providers. Patient is comfortable with making a decision regarding study participation today.  As outlined in the informed consent form, this Nurse and Joshua [name] discussed the purpose of the research study, the investigational nature of the study, study procedures and requirements for study participation, potential risks and benefits of study participation, as well as alternatives to participation. This study is not blinded or double-blinded. The patient understands participation is voluntary and they may withdraw from study participation at any time.  This study does not involve randomization.  This study does not involve an investigational drug or device. This study does not involve a placebo. Patient understands enrollment is pending full eligibility review.   Confidentiality and how the patient's information will be used as part of study participation were discussed.  Patient was informed there is reimbursement provided for their time and effort spent on trial participation.  The patient is encouraged to discuss research study participation  with their insurance provider to determine what costs they may incur as part of study participation, including research related injury.    All questions were answered to patient's satisfaction.  The informed consent and separate HIPAA Authorization was reviewed page by page.  The patient's mental and emotional status is appropriate to provide informed consent, and the patient verbalizes an understanding of study participation.  Patient has agreed to participate in the above listed research study and has voluntarily signed the informed consent version and separate HIPAA Authorization, version 5.0 on 09/20/20 at 1200 PM.  The patient was provided with a copy of the signed informed consent form and separate HIPAA Authorization for their reference.  No study specific procedures were obtained prior to the signing of the informed consent document.  Approximately 30 minutes were spent with the patient reviewing the informed consent documents.  After obtaining informed consent patient, voluntarily signed the optional Release of Information form for use throughout trial participation. The patient understands that he will have his research labs drawn at his next scheduled lab appointment here in the cancer center prior to any treatment being given. Jeral Fruit, RN 09/20/20 12:06 PM

## 2020-09-20 NOTE — H&P (View-Only) (Signed)
START ON PATHWAY REGIMEN - Non-Small Cell Lung     Administer weekly:     Paclitaxel      Carboplatin   **Always confirm dose/schedule in your pharmacy ordering system**  Patient Characteristics: Preoperative or Nonsurgical Candidate (Clinical Staging), Stage III - Nonsurgical Candidate (Nonsquamous and Squamous), PS = 0, 1 Therapeutic Status: Preoperative or Nonsurgical Candidate (Clinical Staging) AJCC T Category: cT1b AJCC N Category: cN2 AJCC M Category: cM0 AJCC 8 Stage Grouping: IIIA ECOG Performance Status: 1 Intent of Therapy: Curative Intent, Discussed with Patient

## 2020-09-20 NOTE — Progress Notes (Signed)
Hematology/Oncology Consult note [facility] Telephone:([phone] [phone]   Patient Care Team: Virginia Crews, MD as PCP - General (Family Medicine) [name], MD as Referring Physician (Physician Assistant) Bary Castilla, [name], MD (General Surgery) Telford Nab, RN as Oncology Nurse Navigator  REFERRING PROVIDER: Virginia Crews, MD  CHIEF COMPLAINTS/REASON FOR VISIT:  Follow up for lung cancer  HISTORY OF PRESENTING ILLNESS:   Joshua [name] is a  65 y.o.  male with PMH listed below was seen in consultation at the request of  Virginia Crews, MD  for evaluation of lung mass  Patient presented to emergency room on 08/25/2020 for evaluation of hemoptysis, coughing.  He coughed up small amount of blood and a large clot.  No fever, chills, chest pain, abdominal pain.  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  Patient is on Plavix and Eliquis.  Since his ER visit, he has not coughed up more blood.  08/25/2020 CT angio chest PE with and without contrast showed ill-defined soft tissue adjacent to the right upper lobe bronchus resulting in narrowing of right upper lobe bronchus.  Also right paratracheal mediastinal adenopathy.  Scattered centrilobular groundglass opacities within the right lower lobe and to a lesser extent within the right lower lobe likely reflecting an infectious or inflammatory process.  Patient lives by himself.  He has a brother who lives close by.  Patient's mother passed away recently.  57-pack-year smoking history.  He quitted smoking in 2016.  INTERVAL HISTORY Joshua [name] is a 65 y.o. male who has above history reviewed by me today presents for follow up visit for Stage III lung squamous cell carcinoma. Problems and complaints are listed below: During the interval  patient underwent biopsy via bronchoscopy. 09/14/2020, right upper lobe lung biopsy and bronchoscopy brushing are both positive for squamous cell carcinoma.Right precarinal space lymph node insufficient lymph node sampling for staging.  09/18/2020, PET scan showed right upper lobe nodule maximal SUV 11.5.  Hypermetabolic cluster right paratracheal adenopathy with maximum SUV up to 12.  No findings of metastatic disease to the neck/abdomen/pelvis or skeleton. Today patient presents to discuss results. No new complaints  Review of Systems  Constitutional: Positive for fatigue. Negative for appetite change, chills, fever and unexpected weight change.  HENT:   Negative for hearing loss and voice change.        Hoarseness   Eyes: Negative for eye problems and icterus.  Respiratory: Positive for cough and shortness of breath. Negative for chest tightness and hemoptysis.   Cardiovascular: Negative for chest pain and leg swelling.  Gastrointestinal: Negative for abdominal distention and abdominal pain.  Endocrine: Negative for hot flashes.  Genitourinary: Negative for difficulty urinating, dysuria and frequency.   Musculoskeletal: Negative for arthralgias.  Skin: Negative for itching and rash.  Neurological: Negative for light-headedness and numbness.  Hematological: Negative for adenopathy. Does not bruise/bleed easily.  Psychiatric/Behavioral: Negative for confusion.    MEDICAL HISTORY:  Past Medical History:  Diagnosis Date  . Allergy   . Dyspnea    former smoker. only doe  . History of kidney stones   . Hypertension   . Peripheral vascular disease ([place])   . Pre-diabetes   . Sleep apnea     SURGICAL HISTORY: Past Surgical History:  Procedure Laterality Date  . ANTERIOR CERVICAL DECOMP/DISCECTOMY FUSION N/A 03/14/2020   Procedure: ANTERIOR CERVICAL DECOMPRESSION/DISCECTOMY FUSION  3 LEVELS C4-7;  Surgeon: Meade Maw, MD;  Location: [facility];  Service: Neurosurgery;  Laterality:  N/A;  . BACK SURGERY  2011  . CENTRAL LINE INSERTION Right 09/10/2016   Procedure: CENTRAL LINE INSERTION;  Surgeon: Katha Cabal, MD;  Location: [facility];  Service: Vascular;  Laterality: Right;  . CERVICAL FUSION     C4-c7 on Mar 14, 2020  . COLONOSCOPY  2013 ?  . CORONARY ANGIOPLASTY    . FEMORAL-POPLITEAL BYPASS GRAFT Right 09/10/2016   Procedure: BYPASS GRAFT FEMORAL-POPLITEAL ARTERY ( BELOW KNEE );  Surgeon: Katha Cabal, MD;  Location: [facility];  Service: Vascular;  Laterality: Right;  . FEMORAL-TIBIAL BYPASS GRAFT Right 03/23/2020   Procedure: BYPASS GRAFT RIGHT FEMORAL- DISTAL TIBIAL ARTERY WITH DISTAL FLOW GRAFT;  Surgeon: Katha Cabal, MD;  Location: [facility];  Service: Vascular;  Laterality: Right;  . LOWER EXTREMITY ANGIOGRAPHY Right 11/18/2016   Procedure: Lower Extremity Angiography;  Surgeon: Katha Cabal, MD;  Location: [place] [facility];  Service: Cardiovascular;  Laterality: Right;  . LOWER EXTREMITY ANGIOGRAPHY Right 12/09/2016   Procedure: Lower Extremity Angiography;  Surgeon: Katha Cabal, MD;  Location: [place] [facility];  Service: Cardiovascular;  Laterality: Right;  . LOWER EXTREMITY ANGIOGRAPHY Right 11/15/2019   Procedure: LOWER EXTREMITY ANGIOGRAPHY;  Surgeon: Katha Cabal, MD;  Location: [place] [facility];  Service: Cardiovascular;  Laterality: Right;  . LOWER EXTREMITY ANGIOGRAPHY Right 12/01/2019   Procedure: Lower Extremity Angiography;  Surgeon: Algernon Huxley, MD;  Location: [place] [facility];  Service: Cardiovascular;  Laterality: Right;  . LOWER EXTREMITY ANGIOGRAPHY Right 12/02/2019   Procedure: LOWER EXTREMITY ANGIOGRAPHY;  Surgeon: Katha Cabal, MD;  Location: [place] [facility];  Service: Cardiovascular;  Laterality: Right;  . LOWER EXTREMITY ANGIOGRAPHY Right 03/23/2020   Procedure: Lower Extremity Angiography;  Surgeon: Katha Cabal, MD;  Location: [place] [facility];  Service: Cardiovascular;   Laterality: Right;  . LOWER EXTREMITY INTERVENTION  11/18/2016   Procedure: Lower Extremity Intervention;  Surgeon: Katha Cabal, MD;  Location: [place] [facility];  Service: Cardiovascular;;  . PERIPHERAL VASCULAR CATHETERIZATION Right 01/02/2015   Procedure: Lower Extremity Angiography;  Surgeon: Katha Cabal, MD;  Location: [place] [facility];  Service: Cardiovascular;  Laterality: Right;  . PERIPHERAL VASCULAR CATHETERIZATION Right 06/18/2015   Procedure: Lower Extremity Angiography;  Surgeon: Algernon Huxley, MD;  Location: [place] [facility];  Service: Cardiovascular;  Laterality: Right;  . PERIPHERAL VASCULAR CATHETERIZATION  06/18/2015   Procedure: Lower Extremity Intervention;  Surgeon: Algernon Huxley, MD;  Location: [place] [facility];  Service: Cardiovascular;;  . PERIPHERAL VASCULAR CATHETERIZATION N/A 10/09/2015   Procedure: Abdominal Aortogram w/Lower Extremity;  Surgeon: Katha Cabal, MD;  Location: [place] [facility];  Service: Cardiovascular;  Laterality: N/A;  . PERIPHERAL VASCULAR CATHETERIZATION  10/09/2015   Procedure: Lower Extremity Intervention;  Surgeon: Katha Cabal, MD;  Location: [place] [facility];  Service: Cardiovascular;;  . PERIPHERAL VASCULAR CATHETERIZATION Right 10/10/2015   Procedure: Lower Extremity Angiography;  Surgeon: Algernon Huxley, MD;  Location: [place] [facility];  Service: Cardiovascular;  Laterality: Right;  . PERIPHERAL VASCULAR CATHETERIZATION Right 10/10/2015   Procedure: Lower Extremity Intervention;  Surgeon: Algernon Huxley, MD;  Location: [place] [facility];  Service: Cardiovascular;  Laterality: Right;  . PERIPHERAL VASCULAR CATHETERIZATION Right 12/03/2015   Procedure: Lower Extremity Angiography;  Surgeon: Algernon Huxley, MD;  Location: [place]  [facility];  Service: Cardiovascular;  Laterality: Right;  . PERIPHERAL VASCULAR CATHETERIZATION Right 12/04/2015   Procedure: Lower Extremity Angiography;  Surgeon: Algernon Huxley, MD;   Location: [place] [facility];  Service: Cardiovascular;  Laterality: Right;  . PERIPHERAL VASCULAR CATHETERIZATION  12/04/2015   Procedure: Lower Extremity Intervention;  Surgeon: Algernon Huxley, MD;  Location: [place] [facility];  Service: Cardiovascular;;  . PERIPHERAL VASCULAR CATHETERIZATION Right 06/30/2016   Procedure: Lower Extremity Angiography;  Surgeon: Algernon Huxley, MD;  Location: [place] [facility];  Service: Cardiovascular;  Laterality: Right;  . PERIPHERAL VASCULAR CATHETERIZATION Right 06/25/2016   Procedure: Lower Extremity Angiography;  Surgeon: Katha Cabal, MD;  Location: [place] [facility];  Service: Cardiovascular;  Laterality: Right;  . PERIPHERAL VASCULAR CATHETERIZATION Right 07/01/2016   Procedure: Lower Extremity Angiography;  Surgeon: Katha Cabal, MD;  Location: [place] [facility];  Service: Cardiovascular;  Laterality: Right;  . PERIPHERAL VASCULAR CATHETERIZATION  07/01/2016   Procedure: Lower Extremity Intervention;  Surgeon: Katha Cabal, MD;  Location: [place] [facility];  Service: Cardiovascular;;  . PERIPHERAL VASCULAR CATHETERIZATION Right 08/01/2016   Procedure: Lower Extremity Angiography;  Surgeon: Katha Cabal, MD;  Location: [place] [facility];  Service: Cardiovascular;  Laterality: Right;  . stent placement in right leg Right   . VIDEO BRONCHOSCOPY N/A 09/14/2020   Procedure: VIDEO BRONCHOSCOPY WITH FLUORO;  Surgeon: Tyler Pita, MD;  Location: [facility];  Service: Cardiopulmonary;  Laterality: N/A;  . VIDEO BRONCHOSCOPY WITH ENDOBRONCHIAL ULTRASOUND N/A 09/14/2020   Procedure: VIDEO BRONCHOSCOPY WITH ENDOBRONCHIAL ULTRASOUND;  Surgeon: Tyler Pita, MD;  Location: [facility];  Service: Cardiopulmonary;  Laterality: N/A;    SOCIAL HISTORY: Social History   Socioeconomic History  . Marital status: Single    Spouse name: Not on file  . Number of children: Not on file  . Years of education: Not on file  . Highest  education level: Not on file  Occupational History  . Occupation: unemployed  Tobacco Use  . Smoking status: Former Smoker    Packs/day: 2.00    Years: 38.00    Pack years: 76.00    Types: Cigarettes    Quit date: 12/20/2014    Years since quitting: 5.7  . Smokeless tobacco: Former Network engineer  . Vaping Use: Never used  Substance and Sexual Activity  . Alcohol use: Not Currently  . Drug use: No  . Sexual activity: Yes  Other Topics Concern  . Not on file  Social History Narrative  . Not on file   Social Determinants of Health   Financial Resource Strain: Not on file  Food Insecurity: Not on file  Transportation Needs: Not on file  Physical Activity: Not on file  Stress: Not on file  Social Connections: Not on file  Intimate Partner Violence: Not on file    FAMILY HISTORY: Family History  Problem Relation Age of Onset  . Diabetes Mother   . Hypertension Mother   . Heart murmur Mother   . Leukemia Mother   . Throat cancer Maternal Grandmother   . Colon cancer Neg Hx   . Breast cancer Neg Hx     ALLERGIES:  is allergic to tape.  MEDICATIONS:  Current Outpatient Medications  Medication Sig Dispense Refill  . acetaminophen (TYLENOL) 325 MG tablet Take 650 mg by mouth every 6 (six) hours as needed for moderate pain.    . Budeson-Glycopyrrol-Formoterol (BREZTRI AEROSPHERE) 160-9-4.8 MCG/ACT AERO  Inhale 2 puffs into the lungs in the morning and at bedtime. 10.7 g 0  . clopidogrel (PLAVIX) 75 MG tablet Take 1 tablet (75 mg total) by mouth daily at 6 (six) AM. 60 tablet 1  . ELIQUIS 5 MG TABS tablet TAKE 1 TABLET BY MOUTH  TWICE DAILY 180 tablet 3  . gabapentin (NEURONTIN) 300 MG capsule Take 2 capsules (600 mg total) by mouth 3 (three) times daily. (Patient taking differently: Take 900 mg by mouth at bedtime as needed (pain).) 540 capsule 1  . lisinopril (ZESTRIL) 10 MG tablet TAKE 1 TABLET BY MOUTH  DAILY 90 tablet 0  . Multiple Vitamin (MULTIVITAMIN WITH MINERALS)  TABS tablet Take 2 tablets by mouth daily.    . simvastatin (ZOCOR) 40 MG tablet TAKE 1 TABLET BY MOUTH AT  BEDTIME 90 tablet 0   No current facility-administered medications for this visit.     PHYSICAL EXAMINATION: ECOG PERFORMANCE STATUS: 1 - Symptomatic but completely ambulatory Vitals:   09/20/20 1108  BP: 132/85  Pulse: 87  Resp: 18  Temp: 98.3 F (36.8 C)  SpO2: 100%   Filed Weights   09/20/20 1108  Weight: 199 lb 6.4 oz (90.4 kg)    Physical Exam Constitutional:      General: He is not in acute distress.    Comments: Walks with a cane.   HENT:     Head: Normocephalic and atraumatic.  Eyes:     General: No scleral icterus. Cardiovascular:     Rate and Rhythm: Normal rate and regular rhythm.     Heart sounds: Normal heart sounds.  Pulmonary:     Effort: Pulmonary effort is normal. No respiratory distress.     Breath sounds: No wheezing.  Abdominal:     General: Bowel sounds are normal. There is no distension.     Palpations: Abdomen is soft.  Musculoskeletal:        General: No deformity. Normal range of motion.     Cervical back: Normal range of motion and neck supple.  Skin:    General: Skin is warm and dry.     Findings: No erythema or rash.  Neurological:     Mental Status: He is alert and oriented to person, place, and time. Mental status is at baseline.     Cranial Nerves: No cranial nerve deficit.     Coordination: Coordination normal.  Psychiatric:        Mood and Affect: Mood normal.     LABORATORY DATA:  I have reviewed the data as listed Lab Results  Component Value Date   WBC 7.2 08/25/2020   HGB 12.8 (L) 08/25/2020   HCT 39.7 08/25/2020   MCV 78.6 (L) 08/25/2020   PLT 252 08/25/2020   Recent Labs    03/28/20 0421 03/29/20 0444 03/30/20 0536 04/16/20 1525 08/25/20 1512  NA 133* 139 140  --  139  K 4.1 4.0 4.1  --  3.8  CL 101 103 102  --  105  CO2 28 28 28   --  25  GLUCOSE 116* 101* 99  --  117*  BUN 11 12 12   --  17   CREATININE 0.76 0.85 0.76  --  0.97  CALCIUM 8.2* 8.4* 8.6*  --  9.2  GFRNONAA >60 >60 >60  --  >60  GFRAA >60 >60 >60  --   --   PROT  --   --   --  7.0 7.7  ALBUMIN  --   --   --  4.3 4.0  AST  --   --   --  17 24  ALT  --   --   --  19 18  ALKPHOS  --   --   --  123* 99  BILITOT  --   --   --  0.4 0.6  BILIDIR  --   --   --  0.10  --    Iron/TIBC/Ferritin/ %Sat    Component Value Date/Time   IRON 48 08/30/2020 1052   TIBC 391 08/30/2020 1052   FERRITIN 25 08/30/2020 1052   IRONPCTSAT 12 (L) 08/30/2020 1052      RADIOGRAPHIC STUDIES: I have personally reviewed the radiological images as listed and agreed with the findings in the report. DG Chest 2 View  Result Date: 08/25/2020 CLINICAL DATA:  Cough EXAM: CHEST - 2 VIEW COMPARISON:  August 02, 2009 FINDINGS: The cardiomediastinal silhouette is unchanged in contour. No pleural effusion. No pneumothorax. No acute pleuroparenchymal abnormality. Visualized abdomen is unremarkable. Multilevel degenerative changes of the thoracic spine. Status post ACDF IMPRESSION: No acute cardiopulmonary abnormality. Electronically Signed   By: Valentino Saxon MD   On: 08/25/2020 15:32   CT Angio Chest PE W and/or Wo Contrast  Result Date: 08/25/2020 CLINICAL DATA:  Cough EXAM: CT ANGIOGRAPHY CHEST WITH CONTRAST TECHNIQUE: Multidetector CT imaging of the chest was performed using the standard protocol during bolus administration of intravenous contrast. Multiplanar CT image reconstructions and MIPs were obtained to evaluate the vascular anatomy. CONTRAST:  4mL OMNIPAQUE IOHEXOL 350 MG/ML SOLN COMPARISON:  None. FINDINGS: Cardiovascular: Satisfactory opacification of the pulmonary arteries to the segmental level. No evidence of pulmonary embolism. Normal heart size. No pericardial effusion. Mild LEFT coronary artery atherosclerotic calcifications. Aorta is normal in course and caliber. Mediastinum/Nodes: There is mediastinal adenopathy. RIGHT  paratracheal lymph node measures 19 mm in the short axis (series 5, image 44). Additional ill-defined RIGHT paratracheal lymph node measures 16 mm in the short axis (series 5, image 65). Lungs/Pleura: There is ill-defined soft tissue adjacent to the RIGHT upper lobe bronchus. There is narrowing of the RIGHT upper lobe bronchus secondary to encompass and by the soft tissue. Tissue measures approximately 19 x 26 mm (series 7, image 41). There is mild downstream bronchial wall thickening as well as scattered areas of ground-glass opacities in a centrilobular distribution. There are additional scattered centrilobular ground-glass opacities within the RIGHT lower lobe. Mild paraseptal emphysema. No pleural effusion. Upper Abdomen: No acute abnormality. Musculoskeletal: No acute osseous abnormality. No aggressive osseous lesion identified. Review of the MIP images confirms the above findings. IMPRESSION: 1. Ill-defined soft tissue adjacent to the RIGHT upper lobe bronchus resulting in narrowing of the RIGHT upper lobe bronchus. There is also RIGHT paratracheal mediastinal adenopathy. Findings are concerning for primary bronchogenic malignancy. Recommend further evaluation with tissue sampling. Further staging could be performed with dedicated PET/CT. 2. Scattered centrilobular ground-glass opacities within the RIGHT lower lobe and to a lesser extent within the RIGHT lower lobe, likely reflecting an infectious or inflammatory process. 3. No evidence of pulmonary embolism. These results were called by telephone at the time of interpretation on 08/25/2020 at 6:25 pm to provider [facility] , who verbally acknowledged these results. Electronically Signed   By: Valentino Saxon MD   On: 08/25/2020 18:27   NM PET Image Initial (PI) Skull Base To Thigh  Result Date: 09/19/2020 CLINICAL DATA:  Initial treatment strategy for squamous cell carcinoma of the right upper lobe. EXAM: NUCLEAR MEDICINE PET SKULL  BASE TO THIGH  TECHNIQUE: 10.6 mCi F-18 FDG was injected intravenously. Full-ring PET imaging was performed from the skull base to thigh after the radiotracer. CT data was obtained and used for attenuation correction and anatomic localization. Fasting blood glucose: Ninety-two mg/dl COMPARISON:  CT chest 08/25/2020 FINDINGS: Mediastinal blood pool activity: SUV max 2.6 Liver activity: SUV max 3.7 NECK: Glottic activity is likely physiologic. No specific worrisome activity identified. Incidental CT findings: none CHEST: The central right upper lobe mass along the hilum has a similar morphology to the 08/25/2020 exam, and a maximum SUV of 11.5, compatible with malignancy. Hypermetabolic clustered right paratracheal lymph nodes are present measuring up to 1.9 cm in short axis (formerly the same) with maximum SUV 12.0. The small subcarinal lymph nodes are not appreciably hypermetabolic. Incidental CT findings: Mild left anterior descending coronary artery atherosclerosis. Minimal paraseptal emphysema along the right lung apex. ABDOMEN/PELVIS: No significant abnormal hypermetabolic activity in this region. Incidental CT findings: Aortoiliac atherosclerotic vascular disease. Vascular stents/grafts in the right upper thigh. SKELETON: No significant abnormal hypermetabolic activity in this region. Incidental CT findings: Postoperative findings in the lower lumbar spine. Cervical plate and screw fixator. IMPRESSION: 1. Hypermetabolic central right upper lobe nodule, maximum SUV 11.5. Hypermetabolic cluster right paratracheal adenopathy with maximum SUV up to 12.0. No findings of metastatic disease to the neck, abdomen/pelvis, or skeleton. 2. Other imaging findings of potential clinical significance: Aortic Atherosclerosis (ICD10-I70.0). Coronary atherosclerosis. Electronically Signed   By: Van Clines M.D.   On: 09/19/2020 12:05   DG Chest Port 1 View  Result Date: 09/14/2020 CLINICAL DATA:  Status post biopsy EXAM: PORTABLE  CHEST 1 VIEW COMPARISON:  Chest radiograph and chest CT August 26, 2019 FINDINGS: No pneumothorax. No appreciable edema or airspace opacity. The apparent mass narrowing the right upper lobe bronchus is not well appreciated by radiography. Heart size and pulmonary vascularity appear within normal limits. No adenopathy evident. There is postoperative change in the lower cervical region. IMPRESSION: No pneumothorax. No edema or airspace opacity. Lesions seen on recent CT narrowing the right upper lobe bronchus is not well appreciated by radiography. Heart size within normal limits. Electronically Signed   By: Lowella Grip III M.D.   On: 09/14/2020 15:35      ASSESSMENT & PLAN:  1. Squamous cell carcinoma of right lung ([place])   2. Goals of care, counseling/discussion   3. 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 Obtain MRI brain to complete staging.  The diagnosis of stage III non small cell lung cancer and care plan were discussed with patient in detail.  NCCN guidelines were reviewed and shared with patient.    I recommend concurrent chemoradiation with weekly carboplatin [AUC 2] and Taxol 45mg /m2 followed by systemic immunotherapy with durvalumab. Chemotherapy education was provided.  We had discussed the composition of chemotherapy regimen, length of chemo cycle, duration of treatment and the time to assess response to treatment.  I explained to the patient the risks and benefits of chemotherapy with carboplatin and Taxol  including all but not limited to hair loss, mouth sore, nausea, vomiting, low blood counts,  bleeding, infusion reactions  and risk of life threatening infection and even death, secondary malignancy etc.  Risk of neuropathy is associated with Taxol. .  # I discussed the mechanism of action and rationale of using immunotherapy Durvalumab as maintenance.   Discussed the potential side effects of immunotherapy including but not limited to pneumonitis, diarrhea; skin rash; respiratory failure, neurotoxicity, elevated LFTs/endocrine abnormalities etc.Supportive care measures are necessary for patient well-being and will be provided as necessary. Patient voices understanding and willing to proceed chemotherapy and immunotherapy.  Referred to establish care with radiation oncology. #  Chemotherapy education; refer to vascular surgeon for port placement. Antiemetics-Zofran and Compazine; EMLA cream sent to pharmacy  Send NGS #Chronic anticoagulation with Eliquis and Plavix due to peripheral artery disease/embolism.  Orders Placed This Encounter  Procedures  . MR Brain W Wo Contrast    Standing Status:   Future    Standing Expiration Date:   09/20/2021    Order Specific Question:   If indicated for the ordered procedure, I authorize the administration of contrast media per Radiology protocol    Answer:   Yes    Order Specific Question:   What is the patient's sedation requirement?    Answer:   No Sedation    Order Specific Question:   Does the patient have a pacemaker or implanted devices?    Answer:   No    Order Specific Question:   Use SRS Protocol?    Answer:   Yes    Order Specific Question:   Preferred imaging location?    Answer:   [facility] (table limit - 550lbs)  . Ambulatory referral to Radiation Oncology    Referral Priority:   Routine    Referral Type:   Consultation    Referral Reason:   Specialty Services Required    Requested Specialty:   Radiation Oncology    Number of Visits Requested:   1    All questions were answered. The patient knows to call the clinic with  any problems questions or concerns.  cc Brita Romp Dionne Bucy, MD    Return of visit: To be determined  [name], MD, PhD Hematology Oncology [facility] Pager- [phone] 09/20/2020

## 2020-09-20 NOTE — Progress Notes (Signed)
START ON PATHWAY REGIMEN - Non-Small Cell Lung     Administer weekly:     Paclitaxel      Carboplatin   **Always confirm dose/schedule in your pharmacy ordering system**  Patient Characteristics: Preoperative or Nonsurgical Candidate (Clinical Staging), Stage III - Nonsurgical Candidate (Nonsquamous and Squamous), PS = 0, 1 Therapeutic Status: Preoperative or Nonsurgical Candidate (Clinical Staging) AJCC T Category: cT1b AJCC N Category: cN2 AJCC M Category: cM0 AJCC 8 Stage Grouping: IIIA ECOG Performance Status: 1 Intent of Therapy: Curative Intent, Discussed with Patient

## 2020-09-20 NOTE — Progress Notes (Signed)
Patient here for follow up. No new concerns voiced.  °

## 2020-09-21 NOTE — Progress Notes (Signed)
  Oncology Nurse Navigator Documentation  Navigator Location: [facility] (09/21/20 0900)   )Navigator Encounter Type: Follow-up Appt (09/21/20 0900)     Confirmed Diagnosis Date: 09/17/20 (09/20/20 0920)               Patient Visit Type: MedOnc (09/21/20 0900) Treatment Phase: Pre-Tx/Tx Discussion (09/21/20 0900) Barriers/Navigation Needs: Coordination of Care;Education (09/21/20 0900) Education: Newly Diagnosed Cancer Education;Understanding Cancer/ Treatment Options (09/21/20 0900) Interventions: Coordination of Care (09/21/20 0900) Referrals: Radiation Oncology (09/21/20 0900) Coordination of Care: Appts;Pathology;Chemo;Radiology (09/21/20 0900)     met with patient during follow up visit with Dr. Tasia Catchings to discuss recent pathology results and treatment options. All questions answered during visit. Pt given resources regarding diagnosis and supportive services available. Reviewed upcoming appts. Nothing further needed at this time. Instructed to call with any further questions or needs. Pt verbalized understanding.             Time Spent with Patient: 60 (09/21/20 0900)

## 2020-09-24 NOTE — Patient Instructions (Signed)
Paclitaxel injection What is this medicine? PACLITAXEL (PAK li TAX el) is a chemotherapy drug. It targets fast dividing cells, like cancer cells, and causes these cells to die. This medicine is used to treat ovarian cancer, breast cancer, lung cancer, Kaposi's sarcoma, and other cancers. This medicine may be used for other purposes; ask your health care provider or pharmacist if you have questions. COMMON BRAND NAME(S): Onxol, Taxol What should I tell my health care provider before I take this medicine? They need to know if you have any of these conditions:  history of irregular heartbeat  liver disease  low blood counts, like low white cell, platelet, or red cell counts  lung or breathing disease, like asthma  tingling of the fingers or toes, or other nerve disorder  an unusual or allergic reaction to paclitaxel, alcohol, polyoxyethylated castor oil, other chemotherapy, other medicines, foods, dyes, or preservatives  pregnant or trying to get pregnant  breast-feeding How should I use this medicine? This drug is given as an infusion into a vein. It is administered in a hospital or clinic by a specially trained health care professional. Talk to your pediatrician regarding the use of this medicine in children. Special care may be needed. Overdosage: If you think you have taken too much of this medicine contact a poison control center or emergency room at once. NOTE: This medicine is only for you. Do not share this medicine with others. What if I miss a dose? It is important not to miss your dose. Call your doctor or health care professional if you are unable to keep an appointment. What may interact with this medicine? Do not take this medicine with any of the following medications:  live virus vaccines This medicine may also interact with the following medications:  antiviral medicines for hepatitis, HIV or AIDS  certain antibiotics like erythromycin and clarithromycin  certain  medicines for fungal infections like ketoconazole and itraconazole  certain medicines for seizures like carbamazepine, phenobarbital, phenytoin  gemfibrozil  nefazodone  rifampin  St. John's wort This list may not describe all possible interactions. Give your health care provider a list of all the medicines, herbs, non-prescription drugs, or dietary supplements you use. Also tell them if you smoke, drink alcohol, or use illegal drugs. Some items may interact with your medicine. What should I watch for while using this medicine? Your condition will be monitored carefully while you are receiving this medicine. You will need important blood work done while you are taking this medicine. This medicine can cause serious allergic reactions. To reduce your risk you will need to take other medicine(s) before treatment with this medicine. If you experience allergic reactions like skin rash, itching or hives, swelling of the face, lips, or tongue, tell your doctor or health care professional right away. In some cases, you may be given additional medicines to help with side effects. Follow all directions for their use. This drug may make you feel generally unwell. This is not uncommon, as chemotherapy can affect healthy cells as well as cancer cells. Report any side effects. Continue your course of treatment even though you feel ill unless your doctor tells you to stop. Call your doctor or health care professional for advice if you get a fever, chills or sore throat, or other symptoms of a cold or flu. Do not treat yourself. This drug decreases your body's ability to fight infections. Try to avoid being around people who are sick. This medicine may increase your risk to bruise  or bleed. Call your doctor or health care professional if you notice any unusual bleeding. Be careful brushing and flossing your teeth or using a toothpick because you may get an infection or bleed more easily. If you have any dental  work done, tell your dentist you are receiving this medicine. Avoid taking products that contain aspirin, acetaminophen, ibuprofen, naproxen, or ketoprofen unless instructed by your doctor. These medicines may hide a fever. Do not become pregnant while taking this medicine. Women should inform their doctor if they wish to become pregnant or think they might be pregnant. There is a potential for serious side effects to an unborn child. Talk to your health care professional or pharmacist for more information. Do not breast-feed an infant while taking this medicine. Men are advised not to father a child while receiving this medicine. This product may contain alcohol. Ask your pharmacist or healthcare provider if this medicine contains alcohol. Be sure to tell all healthcare providers you are taking this medicine. Certain medicines, like metronidazole and disulfiram, can cause an unpleasant reaction when taken with alcohol. The reaction includes flushing, headache, nausea, vomiting, sweating, and increased thirst. The reaction can last from 30 minutes to several hours. What side effects may I notice from receiving this medicine? Side effects that you should report to your doctor or health care professional as soon as possible:  allergic reactions like skin rash, itching or hives, swelling of the face, lips, or tongue  breathing problems  changes in vision  fast, irregular heartbeat  high or low blood pressure  mouth sores  pain, tingling, numbness in the hands or feet  signs of decreased platelets or bleeding - bruising, pinpoint red spots on the skin, black, tarry stools, blood in the urine  signs of decreased red blood cells - unusually weak or tired, feeling faint or lightheaded, falls  signs of infection - fever or chills, cough, sore throat, pain or difficulty passing urine  signs and symptoms of liver injury like dark yellow or brown urine; general ill feeling or flu-like symptoms;  light-colored stools; loss of appetite; nausea; right upper belly pain; unusually weak or tired; yellowing of the eyes or skin  swelling of the ankles, feet, hands  unusually slow heartbeat Side effects that usually do not require medical attention (report to your doctor or health care professional if they continue or are bothersome):  diarrhea  hair loss  loss of appetite  muscle or joint pain  nausea, vomiting  pain, redness, or irritation at site where injected  tiredness This list may not describe all possible side effects. Call your doctor for medical advice about side effects. You may report side effects to FDA at 1-800-FDA-1088. Where should I keep my medicine? This drug is given in a hospital or clinic and will not be stored at home. NOTE: This sheet is a summary. It may not cover all possible information. If you have questions about this medicine, talk to your doctor, pharmacist, or health care provider.  2021 [employer] Standard (2019-06-08 13:37:23) Carboplatin injection What is this medicine? CARBOPLATIN (KAR boe pla tin) is a chemotherapy drug. It targets fast dividing cells, like cancer cells, and causes these cells to die. This medicine is used to treat ovarian cancer and many other cancers. This medicine may be used for other purposes; ask your health care provider or pharmacist if you have questions. COMMON BRAND NAME(S): Paraplatin What should I tell my health care provider before I take this medicine? They need to  know if you have any of these conditions:  blood disorders  hearing problems  kidney disease  recent or ongoing radiation therapy  an unusual or allergic reaction to carboplatin, cisplatin, other chemotherapy, other medicines, foods, dyes, or preservatives  pregnant or trying to get pregnant  breast-feeding How should I use this medicine? This drug is usually given as an infusion into a vein. It is administered in a hospital or clinic by  a specially trained health care professional. Talk to your pediatrician regarding the use of this medicine in children. Special care may be needed. Overdosage: If you think you have taken too much of this medicine contact a poison control center or emergency room at once. NOTE: This medicine is only for you. Do not share this medicine with others. What if I miss a dose? It is important not to miss a dose. Call your doctor or health care professional if you are unable to keep an appointment. What may interact with this medicine?  medicines for seizures  medicines to increase blood counts like filgrastim, pegfilgrastim, sargramostim  some antibiotics like amikacin, gentamicin, neomycin, streptomycin, tobramycin  vaccines Talk to your doctor or health care professional before taking any of these medicines:  acetaminophen  aspirin  ibuprofen  ketoprofen  naproxen This list may not describe all possible interactions. Give your health care provider a list of all the medicines, herbs, non-prescription drugs, or dietary supplements you use. Also tell them if you smoke, drink alcohol, or use illegal drugs. Some items may interact with your medicine. What should I watch for while using this medicine? Your condition will be monitored carefully while you are receiving this medicine. You will need important blood work done while you are taking this medicine. This drug may make you feel generally unwell. This is not uncommon, as chemotherapy can affect healthy cells as well as cancer cells. Report any side effects. Continue your course of treatment even though you feel ill unless your doctor tells you to stop. In some cases, you may be given additional medicines to help with side effects. Follow all directions for their use. Call your doctor or health care professional for advice if you get a fever, chills or sore throat, or other symptoms of a cold or flu. Do not treat yourself. This drug decreases  your body's ability to fight infections. Try to avoid being around people who are sick. This medicine may increase your risk to bruise or bleed. Call your doctor or health care professional if you notice any unusual bleeding. Be careful brushing and flossing your teeth or using a toothpick because you may get an infection or bleed more easily. If you have any dental work done, tell your dentist you are receiving this medicine. Avoid taking products that contain aspirin, acetaminophen, ibuprofen, naproxen, or ketoprofen unless instructed by your doctor. These medicines may hide a fever. Do not become pregnant while taking this medicine. Women should inform their doctor if they wish to become pregnant or think they might be pregnant. There is a potential for serious side effects to an unborn child. Talk to your health care professional or pharmacist for more information. Do not breast-feed an infant while taking this medicine. What side effects may I notice from receiving this medicine? Side effects that you should report to your doctor or health care professional as soon as possible:  allergic reactions like skin rash, itching or hives, swelling of the face, lips, or tongue  signs of infection - fever or  chills, cough, sore throat, pain or difficulty passing urine  signs of decreased platelets or bleeding - bruising, pinpoint red spots on the skin, black, tarry stools, nosebleeds  signs of decreased red blood cells - unusually weak or tired, fainting spells, lightheadedness  breathing problems  changes in hearing  changes in vision  chest pain  high blood pressure  low blood counts - This drug may decrease the number of white blood cells, red blood cells and platelets. You may be at increased risk for infections and bleeding.  nausea and vomiting  pain, swelling, redness or irritation at the injection site  pain, tingling, numbness in the hands or feet  problems with balance,  talking, walking  trouble passing urine or change in the amount of urine Side effects that usually do not require medical attention (report to your doctor or health care professional if they continue or are bothersome):  hair loss  loss of appetite  metallic taste in the mouth or changes in taste This list may not describe all possible side effects. Call your doctor for medical advice about side effects. You may report side effects to FDA at 1-800-FDA-1088. Where should I keep my medicine? This drug is given in a hospital or clinic and will not be stored at home. NOTE: This sheet is a summary. It may not cover all possible information. If you have questions about this medicine, talk to your doctor, pharmacist, or health care provider.  2021 [employer]/Gold Standard (2007-10-12 14:38:05) Durvalumab injection What is this medicine? DURVALUMAB (dur VAL ue mab) is a monoclonal antibody. It is used to treat lung cancer. This medicine may be used for other purposes; ask your health care provider or pharmacist if you have questions. COMMON BRAND NAME(S): IMFINZI What should I tell my health care provider before I take this medicine? They need to know if you have any of these conditions:  autoimmune diseases like Crohn's disease, ulcerative colitis, or lupus  have had or planning to have an allogeneic stem cell transplant (uses someone else's stem cells)  history of organ transplant  history of radiation to the chest  nervous system problems like myasthenia gravis or Guillain-Barre syndrome  an unusual or allergic reaction to durvalumab, other medicines, foods, dyes, or preservatives  pregnant or trying to get pregnant  breast-feeding How should I use this medicine? This medicine is for infusion into a vein. It is given by a health care professional in a hospital or clinic setting. A special MedGuide will be given to you before each treatment. Be sure to read this information carefully  each time. Talk to your pediatrician regarding the use of this medicine in children. Special care may be needed. Overdosage: If you think you have taken too much of this medicine contact a poison control center or emergency room at once. NOTE: This medicine is only for you. Do not share this medicine with others. What if I miss a dose? It is important not to miss your dose. Call your doctor or health care professional if you are unable to keep an appointment. What may interact with this medicine? Interactions have not been studied. This list may not describe all possible interactions. Give your health care provider a list of all the medicines, herbs, non-prescription drugs, or dietary supplements you use. Also tell them if you smoke, drink alcohol, or use illegal drugs. Some items may interact with your medicine. What should I watch for while using this medicine? This drug may make you feel  generally unwell. Continue your course of treatment even though you feel ill unless your doctor tells you to stop. You may need blood work done while you are taking this medicine. Do not become pregnant while taking this medicine or for 3 months after stopping it. Women should inform their doctor if they wish to become pregnant or think they might be pregnant. There is a potential for serious side effects to an unborn child. Talk to your health care professional or pharmacist for more information. Do not breast-feed an infant while taking this medicine or for 3 months after stopping it. What side effects may I notice from receiving this medicine? Side effects that you should report to your doctor or health care professional as soon as possible:  allergic reactions like skin rash, itching or hives, swelling of the face, lips, or tongue  black, tarry stools  bloody or watery diarrhea  breathing problems  change in emotions or moods  change in sex drive  changes in vision  chest pain or chest  tightness  chills  confusion  cough  facial flushing  fever  headache  signs and symptoms of high blood sugar such as dizziness; dry mouth; dry skin; fruity breath; nausea; stomach pain; increased hunger or thirst; increased urination  signs and symptoms of liver injury like dark yellow or brown urine; general ill feeling or flu-like symptoms; light-colored stools; loss of appetite; nausea; right upper belly pain; unusually weak or tired; yellowing of the eyes or skin  stomach pain  trouble passing urine or change in the amount of urine  weight gain or weight loss Side effects that usually do not require medical attention (report these to your doctor or health care professional if they continue or are bothersome):  bone pain  constipation  loss of appetite  muscle pain  nausea  swelling of the ankles, feet, hands  tiredness This list may not describe all possible side effects. Call your doctor for medical advice about side effects. You may report side effects to FDA at 1-800-FDA-1088. Where should I keep my medicine? This drug is given in a hospital or clinic and will not be stored at home. NOTE: This sheet is a summary. It may not cover all possible information. If you have questions about this medicine, talk to your doctor, pharmacist, or health care provider.  2021 [employer] Standard (2019-09-15 13:01:29)

## 2020-09-25 ENCOUNTER — Inpatient Hospital Stay: Payer: Medicare Other

## 2020-09-26 ENCOUNTER — Other Ambulatory Visit: Payer: Self-pay

## 2020-09-26 ENCOUNTER — Encounter: Payer: Self-pay | Admitting: *Deleted

## 2020-09-26 ENCOUNTER — Ambulatory Visit
Admission: RE | Admit: 2020-09-26 | Discharge: 2020-09-26 | Disposition: A | Discharge status: Home / Self Care | Payer: Medicare Other | Source: Ambulatory Visit | Attending: Radiation Oncology | Admitting: Radiation Oncology

## 2020-09-26 ENCOUNTER — Encounter: Payer: Self-pay | Admitting: Radiation Oncology

## 2020-09-26 VITALS — BP 135/83 | HR 79 | Temp 97.5°F | Wt 202.0 lb

## 2020-09-26 DIAGNOSIS — Z79899 Other long term (current) drug therapy: Secondary | ICD-10-CM | POA: Diagnosis not present

## 2020-09-26 DIAGNOSIS — G4730 Sleep apnea, unspecified: Secondary | ICD-10-CM | POA: Diagnosis not present

## 2020-09-26 DIAGNOSIS — Z808 Family history of malignant neoplasm of other organs or systems: Secondary | ICD-10-CM | POA: Diagnosis not present

## 2020-09-26 DIAGNOSIS — Z7901 Long term (current) use of anticoagulants: Secondary | ICD-10-CM | POA: Diagnosis not present

## 2020-09-26 DIAGNOSIS — I10 Essential (primary) hypertension: Secondary | ICD-10-CM | POA: Diagnosis not present

## 2020-09-26 DIAGNOSIS — Z87442 Personal history of urinary calculi: Secondary | ICD-10-CM | POA: Insufficient documentation

## 2020-09-26 DIAGNOSIS — I739 Peripheral vascular disease, unspecified: Secondary | ICD-10-CM | POA: Insufficient documentation

## 2020-09-26 DIAGNOSIS — R059 Cough, unspecified: Secondary | ICD-10-CM | POA: Diagnosis not present

## 2020-09-26 DIAGNOSIS — C3411 Malignant neoplasm of upper lobe, right bronchus or lung: Secondary | ICD-10-CM | POA: Insufficient documentation

## 2020-09-26 DIAGNOSIS — C3491 Malignant neoplasm of unspecified part of right bronchus or lung: Secondary | ICD-10-CM

## 2020-09-26 DIAGNOSIS — Z87891 Personal history of nicotine dependence: Secondary | ICD-10-CM | POA: Diagnosis not present

## 2020-09-26 NOTE — Consult Note (Signed)
NEW PATIENT EVALUATION  Name: Joshua [name]  MRN: [id number]  Date:   09/26/2020     DOB: November 17, 1955   This 65 y.o. 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  REFERRING PHYSICIAN: Virginia Crews, MD  CHIEF COMPLAINT: No chief complaint on file.   DIAGNOSIS: The encounter diagnosis was Squamous cell carcinoma of right lung ([place]).   PREVIOUS INVESTIGATIONS:  PET/CT and CT scans reviewed Pathology report reviewed Clinical notes reviewed  HPI: Patient is a 65 year old male who presented in February 5 to the emergency room with hemoptysis.  Patient is on Plavix and Eliquis for peripheral artery disease lower extremity claudication.  CT scan at that time demonstrated an ill-defined soft tissue mass adjacent to the right upper lobe bronchus resulting in narrowing of the right upper lobe bronchus.  There is also paratracheal mediastinal adenopathy.  PET/CT scan can confirmed hypermetabolic central right upper lobe nodule as well as right paratracheal adenopathy.  No evidence of metastatic disease in abdomen pelvis or skeleton was identified.  Bronchoscopy was performed and positive for squamous cell carcinoma.  He has been seen by medical oncology with recommendation for concurrent chemoradiation.  He is seen today for radiation oncology opinion.  He is doing well still has some trace hemoptysis and cough.  He otherwise is without complaints.  PLANNED TREATMENT REGIMEN: Concurrent chemoradiation  PAST MEDICAL HISTORY:  has a past medical history of Allergy, Dyspnea, History of kidney stones, Hypertension, Peripheral vascular disease ([place]), Pre-diabetes, and Sleep apnea.    PAST SURGICAL HISTORY:  Past Surgical History:  Procedure Laterality Date  . ANTERIOR CERVICAL DECOMP/DISCECTOMY FUSION N/A 03/14/2020   Procedure: ANTERIOR CERVICAL DECOMPRESSION/DISCECTOMY FUSION 3 LEVELS C4-7;  Surgeon: Meade Maw,  MD;  Location: [facility];  Service: Neurosurgery;  Laterality: N/A;  . BACK SURGERY  2011  . CENTRAL LINE INSERTION Right 09/10/2016   Procedure: CENTRAL LINE INSERTION;  Surgeon: Katha Cabal, MD;  Location: [facility];  Service: Vascular;  Laterality: Right;  . CERVICAL FUSION     C4-c7 on Mar 14, 2020  . COLONOSCOPY  2013 ?  . CORONARY ANGIOPLASTY    . FEMORAL-POPLITEAL BYPASS GRAFT Right 09/10/2016   Procedure: BYPASS GRAFT FEMORAL-POPLITEAL ARTERY ( BELOW KNEE );  Surgeon: Katha Cabal, MD;  Location: [facility];  Service: Vascular;  Laterality: Right;  . FEMORAL-TIBIAL BYPASS GRAFT Right 03/23/2020   Procedure: BYPASS GRAFT RIGHT FEMORAL- DISTAL TIBIAL ARTERY WITH DISTAL FLOW GRAFT;  Surgeon: Katha Cabal, MD;  Location: [facility];  Service: Vascular;  Laterality: Right;  . LOWER EXTREMITY ANGIOGRAPHY Right 11/18/2016   Procedure: Lower Extremity Angiography;  Surgeon: Katha Cabal, MD;  Location: [place] [facility];  Service: Cardiovascular;  Laterality: Right;  . LOWER EXTREMITY ANGIOGRAPHY Right 12/09/2016   Procedure: Lower Extremity Angiography;  Surgeon: Katha Cabal, MD;  Location: [place] [facility];  Service: Cardiovascular;  Laterality: Right;  . LOWER EXTREMITY ANGIOGRAPHY Right 11/15/2019   Procedure: LOWER EXTREMITY ANGIOGRAPHY;  Surgeon: Katha Cabal, MD;  Location: [place] [facility];  Service: Cardiovascular;  Laterality: Right;  . LOWER EXTREMITY ANGIOGRAPHY Right 12/01/2019   Procedure: Lower Extremity Angiography;  Surgeon: Algernon Huxley, MD;  Location: [place] [facility];  Service: Cardiovascular;  Laterality: Right;  . LOWER EXTREMITY ANGIOGRAPHY Right 12/02/2019   Procedure: LOWER EXTREMITY ANGIOGRAPHY;  Surgeon: Katha Cabal, MD;  Location: [place] [facility];  Service: Cardiovascular;  Laterality: Right;  . LOWER EXTREMITY ANGIOGRAPHY Right 03/23/2020   Procedure: Lower Extremity Angiography;  Surgeon: Katha Cabal, MD;   Location: [place] [facility];  Service: Cardiovascular;  Laterality: Right;  . LOWER EXTREMITY INTERVENTION  11/18/2016   Procedure: Lower Extremity Intervention;  Surgeon: Katha Cabal, MD;  Location: [place] [facility];  Service: Cardiovascular;;  . PERIPHERAL VASCULAR CATHETERIZATION Right 01/02/2015   Procedure: Lower Extremity Angiography;  Surgeon: Katha Cabal, MD;  Location: [place] [facility];  Service: Cardiovascular;  Laterality: Right;  . PERIPHERAL VASCULAR CATHETERIZATION Right 06/18/2015   Procedure: Lower Extremity Angiography;  Surgeon: Algernon Huxley, MD;  Location: [place] [facility];  Service: Cardiovascular;  Laterality: Right;  . PERIPHERAL VASCULAR CATHETERIZATION  06/18/2015   Procedure: Lower Extremity Intervention;  Surgeon: Algernon Huxley, MD;  Location: [place] [facility];  Service: Cardiovascular;;  . PERIPHERAL VASCULAR CATHETERIZATION N/A 10/09/2015   Procedure: Abdominal Aortogram w/Lower Extremity;  Surgeon: Katha Cabal, MD;  Location: [place] [facility];  Service: Cardiovascular;  Laterality: N/A;  . PERIPHERAL VASCULAR CATHETERIZATION  10/09/2015   Procedure: Lower Extremity Intervention;  Surgeon: Katha Cabal, MD;  Location: [place] [facility];  Service: Cardiovascular;;  . PERIPHERAL VASCULAR CATHETERIZATION Right 10/10/2015   Procedure: Lower Extremity Angiography;  Surgeon: Algernon Huxley, MD;  Location: [place] [facility];  Service: Cardiovascular;  Laterality: Right;  . PERIPHERAL VASCULAR CATHETERIZATION Right 10/10/2015   Procedure: Lower Extremity Intervention;  Surgeon: Algernon Huxley, MD;  Location: [place] [facility];  Service: Cardiovascular;  Laterality: Right;  . PERIPHERAL VASCULAR CATHETERIZATION Right 12/03/2015   Procedure: Lower Extremity Angiography;  Surgeon: Algernon Huxley, MD;  Location: [place] [facility];  Service: Cardiovascular;  Laterality: Right;  . PERIPHERAL VASCULAR CATHETERIZATION Right 12/04/2015   Procedure:  Lower Extremity Angiography;  Surgeon: Algernon Huxley, MD;  Location: [place] [facility];  Service: Cardiovascular;  Laterality: Right;  . PERIPHERAL VASCULAR CATHETERIZATION  12/04/2015   Procedure: Lower Extremity Intervention;  Surgeon: Algernon Huxley, MD;  Location: [place] [facility];  Service: Cardiovascular;;  . PERIPHERAL VASCULAR CATHETERIZATION Right 06/30/2016   Procedure: Lower Extremity Angiography;  Surgeon: Algernon Huxley, MD;  Location: [place] [facility];  Service: Cardiovascular;  Laterality: Right;  . PERIPHERAL VASCULAR CATHETERIZATION Right 06/25/2016   Procedure: Lower Extremity Angiography;  Surgeon: Katha Cabal, MD;  Location: [place] [facility];  Service: Cardiovascular;  Laterality: Right;  . PERIPHERAL VASCULAR CATHETERIZATION Right 07/01/2016   Procedure: Lower Extremity Angiography;  Surgeon: Katha Cabal, MD;  Location: [place] [facility];  Service: Cardiovascular;  Laterality: Right;  . PERIPHERAL VASCULAR CATHETERIZATION  07/01/2016   Procedure: Lower Extremity Intervention;  Surgeon: Katha Cabal, MD;  Location: [place] [facility];  Service: Cardiovascular;;  . PERIPHERAL VASCULAR CATHETERIZATION Right 08/01/2016   Procedure: Lower Extremity Angiography;  Surgeon: Katha Cabal, MD;  Location: [place] [facility];  Service: Cardiovascular;  Laterality: Right;  . stent placement in right leg Right   . VIDEO BRONCHOSCOPY N/A 09/14/2020   Procedure: VIDEO BRONCHOSCOPY WITH FLUORO;  Surgeon: Tyler Pita, MD;  Location: [facility];  Service: Cardiopulmonary;  Laterality: N/A;  . VIDEO BRONCHOSCOPY WITH ENDOBRONCHIAL ULTRASOUND N/A 09/14/2020   Procedure: VIDEO BRONCHOSCOPY WITH ENDOBRONCHIAL ULTRASOUND;  Surgeon: Tyler Pita, MD;  Location: [facility];  Service: Cardiopulmonary;  Laterality: N/A;    FAMILY HISTORY: family history includes Diabetes in his mother;  Heart murmur in his mother; Hypertension in his mother; Leukemia in his mother;  Throat cancer in his maternal grandmother.  SOCIAL HISTORY:  reports that he quit smoking about 5 years ago. His smoking use included cigarettes. He has a 76.00 pack-year smoking history. He has quit using smokeless tobacco. He reports previous alcohol use. He reports that he does not use drugs.  ALLERGIES: Tape  MEDICATIONS:  Current Outpatient Medications  Medication Sig Dispense Refill  . acetaminophen (TYLENOL) 325 MG tablet Take 650 mg by mouth every 6 (six) hours as needed for moderate pain.    Marland Kitchen clopidogrel (PLAVIX) 75 MG tablet Take 1 tablet (75 mg total) by mouth daily at 6 (six) AM. 60 tablet 1  . ELIQUIS 5 MG TABS tablet TAKE 1 TABLET BY MOUTH  TWICE DAILY 180 tablet 3  . gabapentin (NEURONTIN) 300 MG capsule Take 2 capsules (600 mg total) by mouth 3 (three) times daily. (Patient taking differently: Take 900 mg by mouth at bedtime as needed (pain).) 540 capsule 1  . lisinopril (ZESTRIL) 10 MG tablet TAKE 1 TABLET BY MOUTH  DAILY 90 tablet 0  . Multiple Vitamin (MULTIVITAMIN WITH MINERALS) TABS tablet Take 2 tablets by mouth daily.    . simvastatin (ZOCOR) 40 MG tablet TAKE 1 TABLET BY MOUTH AT  BEDTIME 90 tablet 0  . Budeson-Glycopyrrol-Formoterol (BREZTRI AEROSPHERE) 160-9-4.8 MCG/ACT AERO Inhale 2 puffs into the lungs in the morning and at bedtime. (Patient not taking: Reported on 09/26/2020) 10.7 g 0   No current facility-administered medications for this encounter.    ECOG PERFORMANCE STATUS:  1 - Symptomatic but completely ambulatory  REVIEW OF SYSTEMS: Patient does have a history of claudication vascular disease status post tonsillectomy Patient denies any weight loss, fatigue, weakness, fever, chills or night sweats. Patient denies any loss of vision, blurred vision. Patient denies any ringing  of the ears or hearing loss. No irregular heartbeat. Patient denies heart murmur or history of fainting. Patient denies any chest pain or pain radiating to her upper extremities.  Patient denies any shortness of breath, difficulty breathing at night, cough or hemoptysis. Patient denies any swelling in the lower legs. Patient denies any nausea vomiting, vomiting of blood, or coffee ground material in the vomitus. Patient denies any stomach pain. Patient states has had normal bowel movements no significant constipation or diarrhea. Patient denies any dysuria, hematuria or significant nocturia. Patient denies any problems walking, swelling in the joints or loss of balance. Patient denies any skin changes, loss of hair or loss of weight. Patient denies any excessive worrying or anxiety or significant depression. Patient denies any problems with insomnia. Patient denies excessive thirst, polyuria, polydipsia. Patient denies any swollen glands, patient denies easy bruising or easy bleeding. Patient denies any recent infections, allergies or URI. Patient "s visual fields have not changed significantly in recent time.   PHYSICAL EXAM: BP 135/83   Pulse 79   Temp (!) 97.5 F (36.4 C) (Tympanic)   Wt 202 lb (91.6 kg)   SpO2 100% Comment: room air  BMI 27.40 kg/m  Well-developed well-nourished patient in NAD. HEENT reveals PERLA, EOMI, discs not visualized.  Oral cavity is clear. No oral mucosal lesions are identified. Neck is clear without evidence of cervical or supraclavicular adenopathy. Lungs are clear to A&P. Cardiac examination is essentially unremarkable with regular rate and rhythm without murmur rub or thrill. Abdomen is benign with no organomegaly or masses noted. Motor sensory and DTR levels are equal and symmetric in  the upper and lower extremities. Cranial nerves II through XII are grossly intact. Proprioception is intact. No peripheral adenopathy or edema is identified. No motor or sensory levels are noted. Crude visual fields are within normal range.  LABORATORY DATA: Pathology report reviewed    RADIOLOGY RESULTS: CT scans and PET CT scan reviewed compatible with  above-stated findings   IMPRESSION: Stage IIIa squamous cell carcinoma the right lung in 65 year old male for concurrent chemoradiation  PLAN: At this time elect to go ahead with IMRT radiation therapy to his right lung and mediastinal nodes that are involved by PET/CT criteria.  Would plan on delivering 70 Gray over 7 weeks with concurrent chemotherapy.  Risks and benefits of treatment including possible worsening of his cough possible radiation esophagitis fatigue alteration of blood count skin reaction all were reviewed in detail with the patient.  He seems to comprehend our treatment plan well.  I have personally set up and ordered CT simulation for early next week and will coordinate his chemotherapy with medical oncology.  There will be extra effort by both professional staff as well as technical staff to coordinate and manage concurrent chemoradiation and ensuing side effects during his treatments. Patient comprehends my recommendations well.  I would like to take this opportunity to thank you for allowing me to participate in the care of your patient.Noreene Filbert, MD

## 2020-09-26 NOTE — Progress Notes (Signed)
  Oncology Nurse Navigator Documentation  Navigator Location: [facility] (09/26/20 1500)   )Navigator Encounter Type: Appt/Treatment Plan Review (09/26/20 1500)                     Patient Visit Type: RadOnc (09/26/20 1500)   Barriers/Navigation Needs: No Barriers At This Time (09/26/20 1500)   Interventions: None Required (09/26/20 1500)      Appt review. Will follow up with patient at CT sim appt on 3/14.

## 2020-09-27 ENCOUNTER — Telehealth (INDEPENDENT_AMBULATORY_CARE_PROVIDER_SITE_OTHER): Payer: Self-pay

## 2020-09-27 NOTE — Telephone Encounter (Signed)
Spoke with the patient and he is scheduled with Dr. Delana Meyer for a port insertion on 10/05/20 with a 11:30 am arrival time to the [facility]. Covid testing on 10/03/20 between 8-2 pm at the [facility]. Pre-procedure instructions were discussed and will be mailed.

## 2020-10-01 ENCOUNTER — Other Ambulatory Visit: Payer: Self-pay | Admitting: Oncology

## 2020-10-01 ENCOUNTER — Other Ambulatory Visit: Payer: Medicare Other

## 2020-10-01 ENCOUNTER — Encounter: Payer: Self-pay | Admitting: *Deleted

## 2020-10-01 ENCOUNTER — Ambulatory Visit
Admission: RE | Admit: 2020-10-01 | Discharge: 2020-10-01 | Disposition: A | Discharge status: Home / Self Care | Payer: Medicare Other | Source: Ambulatory Visit | Attending: Radiation Oncology | Admitting: Radiation Oncology

## 2020-10-01 DIAGNOSIS — C3491 Malignant neoplasm of unspecified part of right bronchus or lung: Secondary | ICD-10-CM

## 2020-10-01 DIAGNOSIS — Z87891 Personal history of nicotine dependence: Secondary | ICD-10-CM | POA: Diagnosis not present

## 2020-10-01 DIAGNOSIS — Z510 Encounter for antineoplastic radiation therapy: Secondary | ICD-10-CM | POA: Diagnosis not present

## 2020-10-01 DIAGNOSIS — C3411 Malignant neoplasm of upper lobe, right bronchus or lung: Secondary | ICD-10-CM | POA: Diagnosis not present

## 2020-10-01 MED ORDER — PROCHLORPERAZINE MALEATE 10 MG PO TABS: 10.0000 mg | ORAL_TABLET | Freq: Four times a day (QID) | ORAL | 1 refills | Status: DC | PRN

## 2020-10-01 MED ORDER — ONDANSETRON HCL 8 MG PO TABS: 8.0000 mg | ORAL_TABLET | Freq: Two times a day (BID) | ORAL | 1 refills | Status: DC | PRN

## 2020-10-01 MED ORDER — LIDOCAINE-PRILOCAINE 2.5-2.5 % EX CREA: TOPICAL_CREAM | CUTANEOUS | 3 refills | Status: DC

## 2020-10-01 NOTE — Progress Notes (Signed)
  Oncology Nurse Navigator Documentation  Navigator Location: [facility] (10/01/20 1000)   )Navigator Encounter Type: Other: (10/01/20 1000)                     Patient Visit Type: AQLRJP (10/01/20 1000) Treatment Phase: CT SIM (10/01/20 1000) Barriers/Navigation Needs: Coordination of Care (10/01/20 1000)   Interventions: Coordination of Care (10/01/20 1000)   Coordination of Care: Appts;Chemo (10/01/20 1000)         met with patient during CT simulation to review upcoming appts. Pt given print out of upcoming appts. All questions answered during visit. Instructed pt to call with any further questions or needs. Pt verbalized understanding.         Time Spent with Patient: 30 (10/01/20 1000)

## 2020-10-03 ENCOUNTER — Other Ambulatory Visit
Admission: RE | Admit: 2020-10-03 | Discharge: 2020-10-03 | Disposition: A | Discharge status: Home / Self Care | Payer: Medicare Other | Source: Ambulatory Visit | Attending: Vascular Surgery | Admitting: Vascular Surgery

## 2020-10-03 ENCOUNTER — Ambulatory Visit
Admission: RE | Admit: 2020-10-03 | Discharge: 2020-10-03 | Disposition: A | Discharge status: Home / Self Care | Payer: Medicare Other | Source: Ambulatory Visit | Attending: Oncology | Admitting: Oncology

## 2020-10-03 ENCOUNTER — Other Ambulatory Visit: Payer: Self-pay

## 2020-10-03 DIAGNOSIS — C3491 Malignant neoplasm of unspecified part of right bronchus or lung: Secondary | ICD-10-CM | POA: Diagnosis not present

## 2020-10-03 DIAGNOSIS — C342 Malignant neoplasm of middle lobe, bronchus or lung: Secondary | ICD-10-CM | POA: Diagnosis not present

## 2020-10-03 DIAGNOSIS — Z20822 Contact with and (suspected) exposure to covid-19: Secondary | ICD-10-CM | POA: Insufficient documentation

## 2020-10-03 DIAGNOSIS — H748X2 Other specified disorders of left middle ear and mastoid: Secondary | ICD-10-CM | POA: Diagnosis not present

## 2020-10-03 LAB — SARS CORONAVIRUS 2 (TAT 6-24 HRS): SARS Coronavirus 2: NEGATIVE

## 2020-10-03 MED ORDER — GADOBUTROL 1 MMOL/ML IV SOLN
9.0000 mL | Freq: Once | INTRAVENOUS | Status: AC | PRN
  Administered 2020-10-03: 9 mL via INTRAVENOUS

## 2020-10-04 ENCOUNTER — Telehealth: Payer: Self-pay | Admitting: *Deleted

## 2020-10-04 ENCOUNTER — Other Ambulatory Visit (INDEPENDENT_AMBULATORY_CARE_PROVIDER_SITE_OTHER): Payer: Self-pay | Admitting: Nurse Practitioner

## 2020-10-04 DIAGNOSIS — Z87891 Personal history of nicotine dependence: Secondary | ICD-10-CM | POA: Diagnosis not present

## 2020-10-04 DIAGNOSIS — Z510 Encounter for antineoplastic radiation therapy: Secondary | ICD-10-CM | POA: Diagnosis not present

## 2020-10-04 DIAGNOSIS — C3411 Malignant neoplasm of upper lobe, right bronchus or lung: Secondary | ICD-10-CM | POA: Diagnosis not present

## 2020-10-04 NOTE — Telephone Encounter (Signed)
Pt saw MRI results on Mychart. Please advise.   IMPRESSION: 1. No evidence of intracranial metastatic disease. 2. Scattered foci of T2 hyperintensity within the white matter of the cerebral hemispheres, nonspecific, most likely related to chronic microangiopathic changes. 3. Well-defined round lesion within the left parotid tail measuring approximately 2.3 x 1.8 x 1.8 cm without evidence of contrast enhancement. 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

## 2020-10-05 ENCOUNTER — Encounter
Admission: RE | Disposition: A | Discharge status: Home / Self Care | Payer: Self-pay | Source: Ambulatory Visit | Attending: Vascular Surgery

## 2020-10-05 ENCOUNTER — Other Ambulatory Visit: Payer: Medicare Other

## 2020-10-05 ENCOUNTER — Ambulatory Visit: Payer: Medicare Other | Admitting: Oncology

## 2020-10-05 ENCOUNTER — Ambulatory Visit
Admission: RE | Admit: 2020-10-05 | Discharge: 2020-10-05 | Disposition: A | Discharge status: Home / Self Care | Payer: Medicare Other | Source: Ambulatory Visit | Attending: Vascular Surgery | Admitting: Vascular Surgery

## 2020-10-05 ENCOUNTER — Other Ambulatory Visit: Payer: Self-pay

## 2020-10-05 ENCOUNTER — Encounter: Payer: Self-pay | Admitting: Vascular Surgery

## 2020-10-05 DIAGNOSIS — C3490 Malignant neoplasm of unspecified part of unspecified bronchus or lung: Secondary | ICD-10-CM | POA: Diagnosis not present

## 2020-10-05 HISTORY — PX: PORTA CATH INSERTION: CATH118285

## 2020-10-05 SURGERY — PORTA CATH INSERTION
Anesthesia: Moderate Sedation

## 2020-10-05 MED ORDER — FENTANYL CITRATE (PF) 100 MCG/2ML IJ SOLN
INTRAMUSCULAR | Status: DC | PRN
  Administered 2020-10-05: 50 ug via INTRAVENOUS
  Administered 2020-10-05: 25 ug via INTRAVENOUS

## 2020-10-05 MED ORDER — CHLORHEXIDINE GLUCONATE CLOTH 2 % EX PADS
6.0000 | MEDICATED_PAD | Freq: Every day | CUTANEOUS | Status: DC
  Administered 2020-10-05: 6 via TOPICAL

## 2020-10-05 MED ORDER — SODIUM CHLORIDE 0.9 % IV SOLN: INTRAVENOUS | Status: DC

## 2020-10-05 MED ORDER — FENTANYL CITRATE (PF) 100 MCG/2ML IJ SOLN
INTRAMUSCULAR | Status: AC
  Administered 2020-10-05: 50 ug
  Filled 2020-10-05: qty 2

## 2020-10-05 MED ORDER — HYDROMORPHONE HCL 1 MG/ML IJ SOLN: 1.0000 mg | Freq: Once | INTRAMUSCULAR | Status: DC | PRN

## 2020-10-05 MED ORDER — METHYLPREDNISOLONE SODIUM SUCC 125 MG IJ SOLR: 125.0000 mg | Freq: Once | INTRAMUSCULAR | Status: DC | PRN

## 2020-10-05 MED ORDER — MIDAZOLAM HCL 5 MG/5ML IJ SOLN
INTRAMUSCULAR | Status: AC
  Administered 2020-10-05: 2 mg
  Filled 2020-10-05: qty 5

## 2020-10-05 MED ORDER — CEFAZOLIN SODIUM-DEXTROSE 2-4 GM/100ML-% IV SOLN: 2.0000 g | Freq: Once | INTRAVENOUS | Status: DC

## 2020-10-05 MED ORDER — MIDAZOLAM HCL 2 MG/ML PO SYRP: 8.0000 mg | ORAL_SOLUTION | Freq: Once | ORAL | Status: DC | PRN

## 2020-10-05 MED ORDER — MIDAZOLAM HCL 2 MG/2ML IJ SOLN
INTRAMUSCULAR | Status: DC | PRN
  Administered 2020-10-05 (×2): 1 mg via INTRAVENOUS

## 2020-10-05 MED ORDER — FAMOTIDINE 20 MG PO TABS: 40.0000 mg | ORAL_TABLET | Freq: Once | ORAL | Status: DC | PRN

## 2020-10-05 MED ORDER — CEFAZOLIN SODIUM-DEXTROSE 2-4 GM/100ML-% IV SOLN
INTRAVENOUS | Status: AC
  Administered 2020-10-05: 2 g
  Filled 2020-10-05: qty 100

## 2020-10-05 MED ORDER — ONDANSETRON HCL 4 MG/2ML IJ SOLN: 4.0000 mg | Freq: Four times a day (QID) | INTRAMUSCULAR | Status: DC | PRN

## 2020-10-05 MED ORDER — SODIUM CHLORIDE 0.9 % IV SOLN
Freq: Once | INTRAVENOUS | Status: DC
  Filled 2020-10-05: qty 2

## 2020-10-05 MED ORDER — DIPHENHYDRAMINE HCL 50 MG/ML IJ SOLN: 50.0000 mg | Freq: Once | INTRAMUSCULAR | Status: DC | PRN

## 2020-10-05 MED ORDER — FENTANYL CITRATE (PF) 100 MCG/2ML IJ SOLN
INTRAMUSCULAR | Status: AC
  Filled 2020-10-05: qty 2

## 2020-10-05 SURGICAL SUPPLY — 14 items
CANNULA 5F STIFF (CANNULA) ×2 IMPLANT
DERMABOND ADVANCED (GAUZE/BANDAGES/DRESSINGS) ×2
DERMABOND ADVANCED .7 DNX12 (GAUZE/BANDAGES/DRESSINGS) ×2 IMPLANT
DRAPE INCISE 23X17 IOBAN STRL (DRAPES) ×1
DRAPE INCISE IOBAN 23X17 STRL (DRAPES) ×1 IMPLANT
KIT PORT POWER 8FR ISP CVUE (Port) ×2 IMPLANT
PACK ANGIOGRAPHY (CUSTOM PROCEDURE TRAY) ×2 IMPLANT
SPONGE XRAY 4X4 16PLY STRL (MISCELLANEOUS) ×2 IMPLANT
SUT MNCRL 4-0 (SUTURE) ×1
SUT MNCRL 4-0 27XMFL (SUTURE) ×1
SUT VIC AB 3-0 CT1 27 (SUTURE) ×1
SUT VIC AB 3-0 CT1 TAPERPNT 27 (SUTURE) ×1 IMPLANT
SUTURE MNCRL 4-0 27XMF (SUTURE) ×1 IMPLANT
TOWEL OR 17X26 4PK STRL BLUE (TOWEL DISPOSABLE) ×2 IMPLANT

## 2020-10-05 NOTE — Op Note (Signed)
OPERATIVE NOTE   PROCEDURE: 1. Placement of a right IJ Infuse-a-Port  PRE-OPERATIVE DIAGNOSIS: Lung carcinoma  POST-OPERATIVE DIAGNOSIS: Same  SURGEON: Katha Cabal M.D.  ANESTHESIA: Conscious sedation was administered under my direct supervision by the interventional radiology RN. IV Versed plus fentanyl were utilized. Continuous ECG, pulse oximetry and blood pressure was monitored throughout the entire procedure. Conscious sedation was for a total of 31 minutes.  ESTIMATED BLOOD LOSS: Minimal   FINDING(S): 1.  Patent vein  SPECIMEN(S): None  INDICATIONS:   Joshua [name] is a 65 y.o. male who presents with lung carcinoma.  He now requires chemotherapy and therefore appropriate parenteral access.  The risks and benefits for port placement of been reviewed all questions answered patient agrees to proceed.  DESCRIPTION: After obtaining full informed written consent, the patient was brought back to the special procedure suite and placed in the supine position. The patient's right neck and chest wall are prepped and draped in sterile fashion. Appropriate timeout was called.  Ultrasound is placed in a sterile sleeve, ultrasound is utilized to avoid vascular injury as well as secondary to lack of appropriate landmarks. The right IJ vein is identified. It is echolucent and homogeneous as well as easily compressible indicating patency. An image is recorded for the permanent record.  Access to the vein with a micropuncture needle is done under direct ultrasound visualization.  1% lidocaine is infiltrated into the soft tissue at the base of the neck as well as on the chest wall.  Under direct ultrasound visualization a micro-needle is inserted into the vein followed by the micro-wire. Micro-sheath was then advanced and a J wire is inserted without difficulty under fluoroscopic guidance. A small counterincision was created at the wire insertion site. A transverse incision is created 2  fingerbreadths below the scapula and a pocket is fashioned using both blunt and sharp dissection. The pocket is tested for appropriate size with the hub of the Infuse-a-Port. The tunneling device is then used to pull the intravascular portion of the catheter from the pocket to the neck counterincision.  Dilator and peel-away sheath were then inserted over the wire and the wire is removed. Catheter is then advanced into the venous system without difficulty. Peel-away sheath was then removed.  Catheter is then positioned under fluoroscopic guidance at the atrial caval junction. It is then transected connected to the hub and the hope is slipped into the subcutaneous pocket on the chest wall. The hub was then accessed percutaneously and aspirates easily and flushes well and is flushed with 30 cc of heparinized saline. The pocket incision is then closed in layers using interrupted 3-0 Vicryl for the subcutaneous tissues and 4-0 Monocryl subcuticular for skin closure. Dermabond is applied. The neck counterincision was closed with 4-0 Monocryl subcuticular and Dermabond as well.  The patient tolerated the procedure well and there were no immediate complications.  COMPLICATIONS: None  CONDITION: Unchanged  Katha Cabal M.D. [facility] Office: [phone]   10/05/2020, 6:28 PM

## 2020-10-05 NOTE — Telephone Encounter (Signed)
Per Dr. Tasia Catchings, pt needs referral to ENT for possible biopsy of the left parotid gland. Referral faxed to [facility]. Left message with pt regarding MD recommendations. Instructed to call back with any further questions or needs.

## 2020-10-05 NOTE — Interval H&P Note (Signed)
History and Physical Interval Note:  10/05/2020 12:55 PM  Joshua [name]  has presented today for surgery, with the diagnosis of Porta Cath Placement   Lung Ca Covid March 16.  The various methods of treatment have been discussed with the patient and family. After consideration of risks, benefits and other options for treatment, the patient has consented to  Procedure(s): PORTA CATH INSERTION (N/A) as a surgical intervention.  The patient's history has been reviewed, patient examined, no change in status, stable for surgery.  I have reviewed the patient's chart and labs.  Questions were answered to the patient's satisfaction.     [name]

## 2020-10-08 ENCOUNTER — Ambulatory Visit (INDEPENDENT_AMBULATORY_CARE_PROVIDER_SITE_OTHER): Payer: Medicare Other

## 2020-10-08 ENCOUNTER — Other Ambulatory Visit: Payer: Self-pay

## 2020-10-08 ENCOUNTER — Encounter: Payer: Self-pay | Admitting: Vascular Surgery

## 2020-10-08 ENCOUNTER — Other Ambulatory Visit (INDEPENDENT_AMBULATORY_CARE_PROVIDER_SITE_OTHER): Payer: Self-pay | Admitting: Vascular Surgery

## 2020-10-08 ENCOUNTER — Ambulatory Visit (INDEPENDENT_AMBULATORY_CARE_PROVIDER_SITE_OTHER): Payer: Medicare Other | Admitting: Vascular Surgery

## 2020-10-08 VITALS — BP 124/72 | HR 98 | Ht 66.0 in | Wt 198.0 lb

## 2020-10-08 DIAGNOSIS — E782 Mixed hyperlipidemia: Secondary | ICD-10-CM

## 2020-10-08 DIAGNOSIS — I739 Peripheral vascular disease, unspecified: Secondary | ICD-10-CM

## 2020-10-08 DIAGNOSIS — C3491 Malignant neoplasm of unspecified part of right bronchus or lung: Secondary | ICD-10-CM

## 2020-10-08 DIAGNOSIS — I70219 Atherosclerosis of native arteries of extremities with intermittent claudication, unspecified extremity: Secondary | ICD-10-CM

## 2020-10-09 ENCOUNTER — Inpatient Hospital Stay: Payer: Medicare Other

## 2020-10-09 ENCOUNTER — Ambulatory Visit: Admission: RE | Admit: 2020-10-09 | Payer: Medicare Other | Source: Ambulatory Visit

## 2020-10-09 DIAGNOSIS — Z86718 Personal history of other venous thrombosis and embolism: Secondary | ICD-10-CM | POA: Diagnosis not present

## 2020-10-09 DIAGNOSIS — Z5111 Encounter for antineoplastic chemotherapy: Secondary | ICD-10-CM | POA: Diagnosis not present

## 2020-10-09 DIAGNOSIS — C3491 Malignant neoplasm of unspecified part of right bronchus or lung: Secondary | ICD-10-CM

## 2020-10-09 DIAGNOSIS — Z7901 Long term (current) use of anticoagulants: Secondary | ICD-10-CM | POA: Diagnosis not present

## 2020-10-09 DIAGNOSIS — I739 Peripheral vascular disease, unspecified: Secondary | ICD-10-CM | POA: Diagnosis not present

## 2020-10-09 DIAGNOSIS — C3411 Malignant neoplasm of upper lobe, right bronchus or lung: Secondary | ICD-10-CM | POA: Diagnosis not present

## 2020-10-09 LAB — CBC WITH DIFFERENTIAL/PLATELET
Abs Immature Granulocytes: 0.01 10*3/uL (ref 0.00–0.07)
Basophils Absolute: 0.1 10*3/uL (ref 0.0–0.1)
Basophils Relative: 1 %
Eosinophils Absolute: 0.2 10*3/uL (ref 0.0–0.5)
Eosinophils Relative: 3 %
HCT: 37.4 % — ABNORMAL LOW (ref 39.0–52.0)
Hemoglobin: 12 g/dL — ABNORMAL LOW (ref 13.0–17.0)
Immature Granulocytes: 0 %
Lymphocytes Relative: 26 %
Lymphs Abs: 1.6 10*3/uL (ref 0.7–4.0)
MCH: 26.1 pg (ref 26.0–34.0)
MCHC: 32.1 g/dL (ref 30.0–36.0)
MCV: 81.5 fL (ref 80.0–100.0)
Monocytes Absolute: 0.6 10*3/uL (ref 0.1–1.0)
Monocytes Relative: 10 %
Neutro Abs: 3.6 10*3/uL (ref 1.7–7.7)
Neutrophils Relative %: 60 %
Platelets: 287 10*3/uL (ref 150–400)
RBC: 4.59 MIL/uL (ref 4.22–5.81)
RDW: 16.1 % — ABNORMAL HIGH (ref 11.5–15.5)
WBC: 6.1 10*3/uL (ref 4.0–10.5)
nRBC: 0 % (ref 0.0–0.2)

## 2020-10-09 LAB — COMPREHENSIVE METABOLIC PANEL
ALT: 16 U/L (ref 0–44)
AST: 21 U/L (ref 15–41)
Albumin: 3.9 g/dL (ref 3.5–5.0)
Alkaline Phosphatase: 95 U/L (ref 38–126)
Anion gap: 10 (ref 5–15)
BUN: 13 mg/dL (ref 8–23)
CO2: 25 mmol/L (ref 22–32)
Calcium: 9 mg/dL (ref 8.9–10.3)
Chloride: 103 mmol/L (ref 98–111)
Creatinine, Ser: 0.87 mg/dL (ref 0.61–1.24)
GFR, Estimated: >60 mL/min (ref >60)
Glucose, Bld: 109 mg/dL — ABNORMAL HIGH (ref 70–99)
Potassium: 3.7 mmol/L (ref 3.5–5.1)
Sodium: 138 mmol/L (ref 135–145)
Total Bilirubin: 0.5 mg/dL (ref 0.3–1.2)
Total Protein: 7.6 g/dL (ref 6.5–8.1)

## 2020-10-10 ENCOUNTER — Inpatient Hospital Stay: Payer: Medicare Other

## 2020-10-10 ENCOUNTER — Encounter: Payer: Self-pay | Admitting: Oncology

## 2020-10-10 ENCOUNTER — Inpatient Hospital Stay (HOSPITAL_BASED_OUTPATIENT_CLINIC_OR_DEPARTMENT_OTHER): Payer: Medicare Other | Admitting: Oncology

## 2020-10-10 ENCOUNTER — Ambulatory Visit
Admission: RE | Admit: 2020-10-10 | Discharge: 2020-10-10 | Disposition: A | Discharge status: Home / Self Care | Payer: Medicare Other | Source: Ambulatory Visit | Attending: Radiation Oncology | Admitting: Radiation Oncology

## 2020-10-10 ENCOUNTER — Other Ambulatory Visit: Payer: Medicare Other

## 2020-10-10 ENCOUNTER — Encounter: Payer: Self-pay | Admitting: *Deleted

## 2020-10-10 VITALS — BP 123/69 | HR 86 | Temp 98.9°F | Resp 18 | Wt 198.9 lb

## 2020-10-10 VITALS — BP 120/69 | HR 70 | Temp 96.1°F | Resp 20

## 2020-10-10 DIAGNOSIS — Z510 Encounter for antineoplastic radiation therapy: Secondary | ICD-10-CM | POA: Diagnosis not present

## 2020-10-10 DIAGNOSIS — Z5111 Encounter for antineoplastic chemotherapy: Secondary | ICD-10-CM

## 2020-10-10 DIAGNOSIS — C3491 Malignant neoplasm of unspecified part of right bronchus or lung: Secondary | ICD-10-CM | POA: Diagnosis not present

## 2020-10-10 DIAGNOSIS — I739 Peripheral vascular disease, unspecified: Secondary | ICD-10-CM | POA: Diagnosis not present

## 2020-10-10 DIAGNOSIS — Z7901 Long term (current) use of anticoagulants: Secondary | ICD-10-CM | POA: Diagnosis not present

## 2020-10-10 DIAGNOSIS — Z87891 Personal history of nicotine dependence: Secondary | ICD-10-CM | POA: Diagnosis not present

## 2020-10-10 DIAGNOSIS — Z7189 Other specified counseling: Secondary | ICD-10-CM

## 2020-10-10 DIAGNOSIS — C3411 Malignant neoplasm of upper lobe, right bronchus or lung: Secondary | ICD-10-CM | POA: Diagnosis not present

## 2020-10-10 DIAGNOSIS — Z86718 Personal history of other venous thrombosis and embolism: Secondary | ICD-10-CM | POA: Diagnosis not present

## 2020-10-10 MED ORDER — SODIUM CHLORIDE 0.9 % IV SOLN
45.0000 mg/m2 | Freq: Once | INTRAVENOUS | Status: AC
  Administered 2020-10-10: 96 mg via INTRAVENOUS
  Filled 2020-10-10: qty 16

## 2020-10-10 MED ORDER — HEPARIN SOD (PORK) LOCK FLUSH 100 UNIT/ML IV SOLN
INTRAVENOUS | Status: AC
  Filled 2020-10-10: qty 5

## 2020-10-10 MED ORDER — SODIUM CHLORIDE 0.9 % IV SOLN
275.2000 mg | Freq: Once | INTRAVENOUS | Status: AC
  Administered 2020-10-10: 280 mg via INTRAVENOUS
  Filled 2020-10-10: qty 28

## 2020-10-10 MED ORDER — FAMOTIDINE 20 MG IN NS 100 ML IVPB
20.0000 mg | Freq: Once | INTRAVENOUS | Status: AC
  Administered 2020-10-10: 20 mg via INTRAVENOUS
  Filled 2020-10-10: qty 20

## 2020-10-10 MED ORDER — PALONOSETRON HCL INJECTION 0.25 MG/5ML
0.2500 mg | Freq: Once | INTRAVENOUS | Status: AC
  Administered 2020-10-10: 0.25 mg via INTRAVENOUS
  Filled 2020-10-10: qty 5

## 2020-10-10 MED ORDER — DIPHENHYDRAMINE HCL 50 MG/ML IJ SOLN
50.0000 mg | Freq: Once | INTRAMUSCULAR | Status: AC
  Administered 2020-10-10: 50 mg via INTRAVENOUS
  Filled 2020-10-10: qty 1

## 2020-10-10 MED ORDER — SODIUM CHLORIDE 0.9% FLUSH
10.0000 mL | INTRAVENOUS | Status: DC | PRN
  Administered 2020-10-10: 10 mL
  Filled 2020-10-10: qty 10

## 2020-10-10 MED ORDER — HEPARIN SOD (PORK) LOCK FLUSH 100 UNIT/ML IV SOLN
500.0000 [IU] | Freq: Once | INTRAVENOUS | Status: AC | PRN
  Administered 2020-10-10: 500 [IU]
  Filled 2020-10-10: qty 5

## 2020-10-10 MED ORDER — SODIUM CHLORIDE 0.9 % IV SOLN
20.0000 mg | Freq: Once | INTRAVENOUS | Status: AC
  Administered 2020-10-10: 20 mg via INTRAVENOUS
  Filled 2020-10-10: qty 20

## 2020-10-10 MED ORDER — SODIUM CHLORIDE 0.9 % IV SOLN
Freq: Once | INTRAVENOUS | Status: AC
  Filled 2020-10-10: qty 250

## 2020-10-10 NOTE — Progress Notes (Signed)
  Oncology Nurse Navigator Documentation  Navigator Location: [facility] (10/10/20 1000)   )Navigator Encounter Type: Follow-up Appt;Treatment (10/10/20 1000)                     Patient Visit Type: MedOnc;RadOnc (10/10/20 1000) Treatment Phase: First Chemo Tx;First Radiation Tx (10/10/20 1000) Barriers/Navigation Needs: No Barriers At This Time (10/10/20 1000)   Interventions: None Required (10/10/20 1000)           met with patient prior to start chemotherapy today. All questions answered during visit. Pt did not have any further needs during visit. Instructed pt to call with any further questions or needs.           Time Spent with Patient: 30 (10/10/20 1000)

## 2020-10-10 NOTE — Progress Notes (Signed)
Hematology/Oncology Consult note [facility] [phone] [phone]   Patient Care Team: Virginia Crews, MD as PCP - General (Family Medicine) [name], MD as Referring Physician (Physician Assistant) Bary Castilla, [name], MD (General Surgery) Telford Nab, RN as Oncology Nurse Navigator  REFERRING PROVIDER: Virginia Crews, MD  CHIEF COMPLAINTS/REASON FOR VISIT:  Follow up for lung cancer  HISTORY OF PRESENTING ILLNESS:   Joshua [name] is a  65 y.o.  male with PMH listed below was seen in consultation at the request of  Virginia Crews, MD  for evaluation of lung mass  Patient presented to emergency room on 08/25/2020 for evaluation of hemoptysis, coughing.  He coughed up small amount of blood and a large clot.  No fever, chills, chest pain, abdominal pain.  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  Patient is on Plavix and Eliquis.  Since his ER visit, he has not coughed up more blood.  08/25/2020 CT angio chest PE with and without contrast showed ill-defined soft tissue adjacent to the right upper lobe bronchus resulting in narrowing of right upper lobe bronchus.  Also right paratracheal mediastinal adenopathy.  Scattered centrilobular groundglass opacities within the right lower lobe and to a lesser extent within the right lower lobe likely reflecting an infectious or inflammatory process.  Patient lives by himself.  He has a brother who lives close by.  Patient's mother passed away recently.  57-pack-year smoking history.  He quitted smoking in 2016.  # 09/14/2020, right upper lobe lung biopsy and bronchoscopy brushing are both positive for squamous cell carcinoma.Right precarinal space lymph node insufficient lymph node sampling for staging. 09/18/2020, PET scan showed right upper lobe  nodule maximal SUV 11.5.  Hypermetabolic cluster right paratracheal adenopathy with maximum SUV up to 12.  No findings of metastatic disease to the neck/abdomen/pelvis or skeleton. Today patient presents to discuss results. No new complaints  INTERVAL HISTORY Joshua [name] is a 65 y.o. male who has above history reviewed by me today presents for follow up visit for Stage III lung squamous cell carcinoma. Problems and complaints are listed below: Patient has had Mediport placed.  No new complaints.  Breathing status at baseline. 10/03/2000, MRI brain with and without contrast showed no evidence of intracranial metastatic disease. Well-defined round lesion within the left parotid tail measuring approximately 2.3 x 1.8 x 1.8 cm  Review of Systems  Constitutional: Positive for fatigue. Negative for appetite change, chills, fever and unexpected weight change.  HENT:   Negative for hearing loss and voice change.        Hoarseness   Eyes: Negative for eye problems and icterus.  Respiratory: Positive for cough and shortness of breath. Negative for chest tightness and hemoptysis.   Cardiovascular: Negative for chest pain and leg swelling.  Gastrointestinal: Negative for abdominal distention and abdominal pain.  Endocrine: Negative for hot flashes.  Genitourinary: Negative for difficulty urinating, dysuria and frequency.   Musculoskeletal: Negative for arthralgias.  Skin: Negative for itching and rash.  Neurological: Negative for light-headedness and numbness.  Hematological: Negative for adenopathy. Does not bruise/bleed easily.  Psychiatric/Behavioral: Negative for confusion.    MEDICAL HISTORY:  Past Medical History:  Diagnosis Date  . Allergy   . Dyspnea    former smoker. only doe  . History of kidney stones   . Hypertension   . Peripheral vascular disease ([place])   .  Pre-diabetes   . Sleep apnea     SURGICAL HISTORY: Past Surgical History:  Procedure Laterality Date  .  ANTERIOR CERVICAL DECOMP/DISCECTOMY FUSION N/A 03/14/2020   Procedure: ANTERIOR CERVICAL DECOMPRESSION/DISCECTOMY FUSION 3 LEVELS C4-7;  Surgeon: Meade Maw, MD;  Location: [facility];  Service: Neurosurgery;  Laterality: N/A;  . BACK SURGERY  2011  . CENTRAL LINE INSERTION Right 09/10/2016   Procedure: CENTRAL LINE INSERTION;  Surgeon: Katha Cabal, MD;  Location: [facility];  Service: Vascular;  Laterality: Right;  . CERVICAL FUSION     C4-c7 on Mar 14, 2020  . COLONOSCOPY  2013 ?  . CORONARY ANGIOPLASTY    . FEMORAL-POPLITEAL BYPASS GRAFT Right 09/10/2016   Procedure: BYPASS GRAFT FEMORAL-POPLITEAL ARTERY ( BELOW KNEE );  Surgeon: Katha Cabal, MD;  Location: [facility];  Service: Vascular;  Laterality: Right;  . FEMORAL-TIBIAL BYPASS GRAFT Right 03/23/2020   Procedure: BYPASS GRAFT RIGHT FEMORAL- DISTAL TIBIAL ARTERY WITH DISTAL FLOW GRAFT;  Surgeon: Katha Cabal, MD;  Location: [facility];  Service: Vascular;  Laterality: Right;  . LOWER EXTREMITY ANGIOGRAPHY Right 11/18/2016   Procedure: Lower Extremity Angiography;  Surgeon: Katha Cabal, MD;  Location: [place] [facility];  Service: Cardiovascular;  Laterality: Right;  . LOWER EXTREMITY ANGIOGRAPHY Right 12/09/2016   Procedure: Lower Extremity Angiography;  Surgeon: Katha Cabal, MD;  Location: [place] [facility];  Service: Cardiovascular;  Laterality: Right;  . LOWER EXTREMITY ANGIOGRAPHY Right 11/15/2019   Procedure: LOWER EXTREMITY ANGIOGRAPHY;  Surgeon: Katha Cabal, MD;  Location: [place] [facility];  Service: Cardiovascular;  Laterality: Right;  . LOWER EXTREMITY ANGIOGRAPHY Right 12/01/2019   Procedure: Lower Extremity Angiography;  Surgeon: Algernon Huxley, MD;  Location: [place] [facility];  Service: Cardiovascular;  Laterality: Right;  . LOWER EXTREMITY ANGIOGRAPHY Right 12/02/2019   Procedure: LOWER EXTREMITY ANGIOGRAPHY;  Surgeon: Katha Cabal, MD;  Location: [place] [facility];  Service:  Cardiovascular;  Laterality: Right;  . LOWER EXTREMITY ANGIOGRAPHY Right 03/23/2020   Procedure: Lower Extremity Angiography;  Surgeon: Katha Cabal, MD;  Location: [place] [facility];  Service: Cardiovascular;  Laterality: Right;  . LOWER EXTREMITY INTERVENTION  11/18/2016   Procedure: Lower Extremity Intervention;  Surgeon: Katha Cabal, MD;  Location: [place] [facility];  Service: Cardiovascular;;  . PERIPHERAL VASCULAR CATHETERIZATION Right 01/02/2015   Procedure: Lower Extremity Angiography;  Surgeon: Katha Cabal, MD;  Location: [place] [facility];  Service: Cardiovascular;  Laterality: Right;  . PERIPHERAL VASCULAR CATHETERIZATION Right 06/18/2015   Procedure: Lower Extremity Angiography;  Surgeon: Algernon Huxley, MD;  Location: [place] [facility];  Service: Cardiovascular;  Laterality: Right;  . PERIPHERAL VASCULAR CATHETERIZATION  06/18/2015   Procedure: Lower Extremity Intervention;  Surgeon: Algernon Huxley, MD;  Location: [place] [facility];  Service: Cardiovascular;;  . PERIPHERAL VASCULAR CATHETERIZATION N/A 10/09/2015   Procedure: Abdominal Aortogram w/Lower Extremity;  Surgeon: Katha Cabal, MD;  Location: [place] [facility];  Service: Cardiovascular;  Laterality: N/A;  . PERIPHERAL VASCULAR CATHETERIZATION  10/09/2015   Procedure: Lower Extremity Intervention;  Surgeon: Katha Cabal, MD;  Location: [place] [facility];  Service: Cardiovascular;;  . PERIPHERAL VASCULAR CATHETERIZATION Right 10/10/2015   Procedure: Lower Extremity Angiography;  Surgeon: Algernon Huxley, MD;  Location: [place] [facility];  Service: Cardiovascular;  Laterality: Right;  . PERIPHERAL VASCULAR CATHETERIZATION Right 10/10/2015   Procedure: Lower Extremity Intervention;  Surgeon: Algernon Huxley, MD;  Location: [place] [facility];  Service: Cardiovascular;  Laterality: Right;  . PERIPHERAL VASCULAR CATHETERIZATION Right 12/03/2015   Procedure: Lower Extremity Angiography;  Surgeon: Algernon Huxley, MD;  Location: [place] [facility];  Service: Cardiovascular;  Laterality: Right;  . PERIPHERAL VASCULAR CATHETERIZATION Right 12/04/2015   Procedure: Lower Extremity Angiography;  Surgeon: Algernon Huxley, MD;  Location: [place] [facility];  Service: Cardiovascular;  Laterality: Right;  . PERIPHERAL VASCULAR CATHETERIZATION  12/04/2015   Procedure: Lower Extremity Intervention;  Surgeon: Algernon Huxley, MD;  Location: [place] [facility];  Service: Cardiovascular;;  . PERIPHERAL VASCULAR CATHETERIZATION Right 06/30/2016   Procedure: Lower Extremity Angiography;  Surgeon: Algernon Huxley, MD;  Location: [place] [facility];  Service: Cardiovascular;  Laterality: Right;  . PERIPHERAL VASCULAR CATHETERIZATION Right 06/25/2016   Procedure: Lower Extremity Angiography;  Surgeon: Katha Cabal, MD;  Location: [place] [facility];  Service: Cardiovascular;  Laterality: Right;  . PERIPHERAL VASCULAR CATHETERIZATION Right 07/01/2016   Procedure: Lower Extremity Angiography;  Surgeon: Katha Cabal, MD;  Location: [place] [facility];  Service: Cardiovascular;  Laterality: Right;  . PERIPHERAL VASCULAR CATHETERIZATION  07/01/2016   Procedure: Lower Extremity Intervention;  Surgeon: Katha Cabal, MD;  Location: [place] [facility];  Service: Cardiovascular;;  . PERIPHERAL VASCULAR CATHETERIZATION Right 08/01/2016   Procedure: Lower Extremity Angiography;  Surgeon: Katha Cabal, MD;  Location: [place] [facility];  Service: Cardiovascular;  Laterality: Right;  . PORTA CATH INSERTION N/A 10/05/2020   Procedure: PORTA CATH INSERTION;  Surgeon: Katha Cabal, MD;  Location: [place] [facility];  Service: Cardiovascular;  Laterality: N/A;  . stent placement in right leg Right   . VIDEO BRONCHOSCOPY N/A 09/14/2020   Procedure: VIDEO BRONCHOSCOPY WITH FLUORO;  Surgeon: Tyler Pita, MD;  Location: [facility];  Service: Cardiopulmonary;  Laterality: N/A;  . VIDEO BRONCHOSCOPY WITH  ENDOBRONCHIAL ULTRASOUND N/A 09/14/2020   Procedure: VIDEO BRONCHOSCOPY WITH ENDOBRONCHIAL ULTRASOUND;  Surgeon: Tyler Pita, MD;  Location: [facility];  Service: Cardiopulmonary;  Laterality: N/A;    SOCIAL HISTORY: Social History   Socioeconomic History  . Marital status: Single    Spouse name: Not on file  . Number of children: Not on file  . Years of education: Not on file  . Highest education level: Not on file  Occupational History  . Occupation: unemployed  Tobacco Use  . Smoking status: Former Smoker    Packs/day: 2.00    Years: 38.00    Pack years: 76.00    Types: Cigarettes    Quit date: 12/20/2014    Years since quitting: 5.8  . Smokeless tobacco: Former Network engineer  . Vaping Use: Never used  Substance and Sexual Activity  . Alcohol use: Not Currently  . Drug use: No  . Sexual activity: Yes  Other Topics Concern  . Not on file  Social History Narrative  . Not on file   Social Determinants of Health   Financial Resource Strain: Not on file  Food Insecurity: Not on file  Transportation Needs: Not on file  Physical Activity: Not on file  Stress: Not on file  Social Connections: Not on file  Intimate Partner Violence: Not on file    FAMILY HISTORY: Family History  Problem Relation Age of Onset  . Diabetes Mother   . Hypertension Mother   . Heart murmur Mother   . Leukemia Mother   . Throat cancer Maternal Grandmother   .  Colon cancer Neg Hx   . Breast cancer Neg Hx     ALLERGIES:  is allergic to tape.  MEDICATIONS:  Current Outpatient Medications  Medication Sig Dispense Refill  . acetaminophen (TYLENOL) 325 MG tablet Take 650 mg by mouth every 6 (six) hours as needed for moderate pain.    Marland Kitchen clopidogrel (PLAVIX) 75 MG tablet Take 1 tablet (75 mg total) by mouth daily at 6 (six) AM. 60 tablet 1  . ELIQUIS 5 MG TABS tablet TAKE 1 TABLET BY MOUTH  TWICE DAILY 180 tablet 3  . gabapentin (NEURONTIN) 300 MG capsule Take 2 capsules (600 mg  total) by mouth 3 (three) times daily. (Patient taking differently: Take 900 mg by mouth at bedtime as needed (pain).) 540 capsule 1  . lidocaine-prilocaine (EMLA) cream Apply to affected area once 30 g 3  . lisinopril (ZESTRIL) 10 MG tablet TAKE 1 TABLET BY MOUTH  DAILY 90 tablet 0  . ondansetron (ZOFRAN) 8 MG tablet Take 1 tablet (8 mg total) by mouth 2 (two) times daily as needed for refractory nausea / vomiting. Start on day 3 after chemo. 30 tablet 1  . prochlorperazine (COMPAZINE) 10 MG tablet Take 1 tablet (10 mg total) by mouth every 6 (six) hours as needed (Nausea or vomiting). 30 tablet 1  . simvastatin (ZOCOR) 40 MG tablet TAKE 1 TABLET BY MOUTH AT  BEDTIME 90 tablet 0   No current facility-administered medications for this visit.   Facility-Administered Medications Ordered in Other Visits  Medication Dose Route Frequency Provider Last Rate Last Admin  . heparin lock flush 100 unit/mL  500 Units Intracatheter Once PRN [name], MD      . sodium chloride flush (NS) 0.9 % injection 10 mL  10 mL Intracatheter PRN [name], MD   10 mL at 10/10/20 0951     PHYSICAL EXAMINATION: ECOG PERFORMANCE STATUS: 1 - Symptomatic but completely ambulatory Vitals:   10/10/20 0902  BP: 123/69  Pulse: 86  Resp: 18  Temp: 98.9 F (37.2 C)  SpO2: 100%   Filed Weights   10/10/20 0902  Weight: 198 lb 14.4 oz (90.2 kg)    Physical Exam Constitutional:      General: He is not in acute distress.    Comments: Walks with a cane.   HENT:     Head: Normocephalic and atraumatic.  Eyes:     General: No scleral icterus. Cardiovascular:     Rate and Rhythm: Normal rate and regular rhythm.     Heart sounds: Normal heart sounds.  Pulmonary:     Effort: Pulmonary effort is normal. No respiratory distress.     Breath sounds: No wheezing.  Abdominal:     General: Bowel sounds are normal. There is no distension.     Palpations: Abdomen is soft.  Musculoskeletal:        General: No deformity.  Normal range of motion.     Cervical back: Normal range of motion and neck supple.  Skin:    General: Skin is warm and dry.     Findings: No erythema or rash.  Neurological:     Mental Status: He is alert and oriented to person, place, and time. Mental status is at baseline.     Cranial Nerves: No cranial nerve deficit.     Coordination: Coordination normal.  Psychiatric:        Mood and Affect: Mood normal.     LABORATORY DATA:  I have reviewed the data as  listed Lab Results  Component Value Date   WBC 6.1 10/09/2020   HGB 12.0 (L) 10/09/2020   HCT 37.4 (L) 10/09/2020   MCV 81.5 10/09/2020   PLT 287 10/09/2020   Recent Labs    03/28/20 0421 03/29/20 0444 03/30/20 0536 04/16/20 1525 08/25/20 1512 10/09/20 1552  NA 133* 139 140  --  139 138  K 4.1 4.0 4.1  --  3.8 3.7  CL 101 103 102  --  105 103  CO2 28 28 28   --  25 25  GLUCOSE 116* 101* 99  --  117* 109*  BUN 11 12 12   --  17 13  CREATININE 0.76 0.85 0.76  --  0.97 0.87  CALCIUM 8.2* 8.4* 8.6*  --  9.2 9.0  GFRNONAA >60 >60 >60  --  >60 >60  GFRAA >60 >60 >60  --   --   --   PROT  --   --   --  7.0 7.7 7.6  ALBUMIN  --   --   --  4.3 4.0 3.9  AST  --   --   --  17 24 21   ALT  --   --   --  19 18 16   ALKPHOS  --   --   --  123* 99 95  BILITOT  --   --   --  0.4 0.6 0.5  BILIDIR  --   --   --  0.10  --   --    Iron/TIBC/Ferritin/ %Sat    Component Value Date/Time   IRON 48 08/30/2020 1052   TIBC 391 08/30/2020 1052   FERRITIN 25 08/30/2020 1052   IRONPCTSAT 12 (L) 08/30/2020 1052      RADIOGRAPHIC STUDIES: I have personally reviewed the radiological images as listed and agreed with the findings in the report. MR Brain W Wo Contrast  Result Date: 10/03/2020 CLINICAL DATA:  Squamous cell carcinoma of right lung. EXAM: MRI HEAD WITHOUT AND WITH CONTRAST TECHNIQUE: Multiplanar, multiecho pulse sequences of the brain and surrounding structures were obtained without and with intravenous contrast. CONTRAST:   20mL GADAVIST GADOBUTROL 1 MMOL/ML IV SOLN COMPARISON:  None. FINDINGS: Brain: No acute infarction, hemorrhage, hydrocephalus, extra-axial collection or mass lesion. Scattered foci of T2 hyperintensity are seen within the white matter of the cerebral hemispheres nonspecific, most likely related to chronic microangiopathic changes. No focus of abnormal contrast enhancement. Vascular: Normal flow voids. Skull and upper cervical spine: Normal marrow signal. Sinuses/Orbits: Negative. Other: Minimal left mastoid effusion. Well-defined round lesion within the left parotid tail measuring approximately 2.3 x 1.8 x 1.8 cm without evidence of contrast enhancement (series 2, image 29; series 14, image 126; series 15, image 28). IMPRESSION: 1. No evidence of intracranial metastatic disease. 2. Scattered foci of T2 hyperintensity within the white matter of the cerebral hemispheres, nonspecific, most likely related to chronic microangiopathic changes. 3. Well-defined round lesion within the left parotid tail measuring approximately 2.3 x 1.8 x 1.8 cm without evidence of contrast enhancement. 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 Electronically Signed   By: Pedro Earls M.D.   On: 10/03/2020 17:02   PERIPHERAL VASCULAR CATHETERIZATION  Result Date: 10/05/2020 See Op Note  NM PET Image Initial (PI) Skull Base To Thigh  Result Date: 09/19/2020 CLINICAL DATA:  Initial treatment strategy for squamous cell carcinoma of the right upper lobe. EXAM: NUCLEAR MEDICINE PET SKULL BASE TO THIGH TECHNIQUE: 10.6 mCi F-18 FDG was injected  intravenously. Full-ring PET imaging was performed from the skull base to thigh after the radiotracer. CT data was obtained and used for attenuation correction and anatomic localization. Fasting blood glucose: Ninety-two mg/dl COMPARISON:  CT chest 08/25/2020 FINDINGS: Mediastinal blood pool activity: SUV max 2.6 Liver activity: SUV max 3.7 NECK: Glottic activity  is likely physiologic. No specific worrisome activity identified. Incidental CT findings: none CHEST: The central right upper lobe mass along the hilum has a similar morphology to the 08/25/2020 exam, and a maximum SUV of 11.5, compatible with malignancy. Hypermetabolic clustered right paratracheal lymph nodes are present measuring up to 1.9 cm in short axis (formerly the same) with maximum SUV 12.0. The small subcarinal lymph nodes are not appreciably hypermetabolic. Incidental CT findings: Mild left anterior descending coronary artery atherosclerosis. Minimal paraseptal emphysema along the right lung apex. ABDOMEN/PELVIS: No significant abnormal hypermetabolic activity in this region. Incidental CT findings: Aortoiliac atherosclerotic vascular disease. Vascular stents/grafts in the right upper thigh. SKELETON: No significant abnormal hypermetabolic activity in this region. Incidental CT findings: Postoperative findings in the lower lumbar spine. Cervical plate and screw fixator. IMPRESSION: 1. Hypermetabolic central right upper lobe nodule, maximum SUV 11.5. Hypermetabolic cluster right paratracheal adenopathy with maximum SUV up to 12.0. No findings of metastatic disease to the neck, abdomen/pelvis, or skeleton. 2. Other imaging findings of potential clinical significance: Aortic Atherosclerosis (ICD10-I70.0). Coronary atherosclerosis. Electronically Signed   By: Van Clines M.D.   On: 09/19/2020 12:05   DG Chest Port 1 View  Result Date: 09/14/2020 CLINICAL DATA:  Status post biopsy EXAM: PORTABLE CHEST 1 VIEW COMPARISON:  Chest radiograph and chest CT August 26, 2019 FINDINGS: No pneumothorax. No appreciable edema or airspace opacity. The apparent mass narrowing the right upper lobe bronchus is not well appreciated by radiography. Heart size and pulmonary vascularity appear within normal limits. No adenopathy evident. There is postoperative change in the lower cervical region. IMPRESSION: No  pneumothorax. No edema or airspace opacity. Lesions seen on recent CT narrowing the right upper lobe bronchus is not well appreciated by radiography. Heart size within normal limits. Electronically Signed   By: Lowella Grip III M.D.   On: 09/14/2020 15:35   VAS [place] LOWER EXTREMITY ARTERIAL DUPLEX  Result Date: 10/08/2020 LOWER EXTREMITY ARTERIAL DUPLEX STUDY  Current ABI: Rt 1.05,Lt 1.19 Comparison Study: 07/05/2020 Performing Technologist: Almira Coaster RVS  Examination Guidelines: A complete evaluation includes B-mode imaging, spectral Doppler, color Doppler, and power Doppler as needed of all accessible portions of each vessel. Bilateral testing is considered an integral part of a complete examination. Limited examinations for reoccurring indications may be performed as noted.  +-----------+--------+-----+--------+--------+--------+ RIGHT      PSV cm/sRatioStenosisWaveformComments +-----------+--------+-----+--------+--------+--------+ PTA Distal 97                                    +-----------+--------+-----+--------+--------+--------+ PERO Distal15                                    +-----------+--------+-----+--------+--------+--------+  Right Graft #1: +------------------+--------+--------+--------+------------+                   PSV cm/sStenosisWaveformComments     +------------------+--------+--------+--------+------------+ Inflow            59              biphasicCFA          +------------------+--------+--------+--------+------------+  Prox Anastomosis  68              biphasicGroin area   +------------------+--------+--------+--------+------------+ Proximal Graft    26              biphasicProx Thigh   +------------------+--------+--------+--------+------------+ Mid Graft         36              biphasicMid Thigh    +------------------+--------+--------+--------+------------+ Distal Graft      40              biphasicDistal thigh  +------------------+--------+--------+--------+------------+ Distal Anastomosis36              biphasicProx calf    +------------------+--------+--------+--------+------------+ Outflow           112             biphasic             +------------------+--------+--------+--------+------------+ Right Leg BPG appears to be patent throughout.  Summary: Right: The Right Leg BPG appears to be patent throughout. Reversed flow seen in the Right ATA.  See table(s) above for measurements and observations. Electronically signed by [name] MD on 10/08/2020 at 5:14:49 PM.    Final       ASSESSMENT & PLAN:  1. Squamous cell carcinoma of right lung ([place])   2. Goals of care, counseling/discussion   3. Encounter for antineoplastic chemotherapy   Cancer Staging Squamous cell carcinoma of right lung [facility]) Staging form: Lung, AJCC 8th Edition - Clinical stage from 09/20/2020: Stage IIIA (cT1b, cN2, cM0) - Signed by [name], MD on 09/20/2020   #Squamous cell carcinoma of right lung, stage IIIA, Labs are reviewed and discussed with patient.  Proceed with cycle 1 carboplatin and Taxol.  He has started concurrent radiation. Send NGS  #Parotid tail mass, refer to ENT for evaluation. #Chronic anticoagulation with Eliquis and Plavix due to peripheral artery disease/embolism.  All questions were answered. The patient knows to call the clinic with any problems questions or concerns.  cc Brita Romp Dionne Bucy, MD    Return of visit: 1 week  [name], MD, PhD Hematology Oncology [facility] Pager- [phone] 10/10/2020

## 2020-10-10 NOTE — Progress Notes (Signed)
Patient here for follow up. No new concerns voiced.  °

## 2020-10-11 ENCOUNTER — Encounter (INDEPENDENT_AMBULATORY_CARE_PROVIDER_SITE_OTHER): Payer: Self-pay | Admitting: Vascular Surgery

## 2020-10-11 ENCOUNTER — Telehealth: Payer: Self-pay

## 2020-10-11 ENCOUNTER — Ambulatory Visit
Admission: RE | Admit: 2020-10-11 | Discharge: 2020-10-11 | Disposition: A | Discharge status: Home / Self Care | Payer: Medicare Other | Source: Ambulatory Visit | Attending: Radiation Oncology | Admitting: Radiation Oncology

## 2020-10-11 DIAGNOSIS — Z87891 Personal history of nicotine dependence: Secondary | ICD-10-CM | POA: Diagnosis not present

## 2020-10-11 DIAGNOSIS — Z510 Encounter for antineoplastic radiation therapy: Secondary | ICD-10-CM | POA: Diagnosis not present

## 2020-10-11 DIAGNOSIS — C3411 Malignant neoplasm of upper lobe, right bronchus or lung: Secondary | ICD-10-CM | POA: Diagnosis not present

## 2020-10-11 NOTE — Telephone Encounter (Signed)
Telephone call to patient for follow up after receiving first infusion.   No answer but left message stating we were calling to check on them.  Encouraged patient to call for any questions or concerns.   

## 2020-10-11 NOTE — Progress Notes (Signed)
MRN : [id number]  Joshua [name] is a 65 y.o. (Dec 16, 1955) male who presents with chief complaint of  Chief Complaint  Patient presents with  . Follow-up    3 Mo U/S   .  History of Present Illness:  The patient returns to the office for followup and review of the noninvasive studies. There have been no interval changes in lower extremity symptoms. No interval shortening of the patient's claudication distance or development of rest pain symptoms. No new ulcers or wounds have occurred since the last visit.  The patient is s/p a revision of his right leg bypass on 03/23/2020 with a redo right profunda femorus to anterior tibial bypass. He required thrombectomy on 03/24/2020 but no abnormality was uncovered. He has done well since that secondary issue.  There have been no significant changes to the patient's overall health care.  The patient denies amaurosis fugax or recent TIA symptoms. There are no recent neurological changes noted. The patient denies history of DVT, PE or superficial thrombophlebitis. The patient denies recent episodes of angina or shortness of breath.   ABI Rt=1.05 and Lt=1.19  (previous ABI's Rt=1.03 and Lt=1.18) Duplex ultrasound of the right leg shows the bypass is patent with uniform flow   Current Meds  Medication Sig  . acetaminophen (TYLENOL) 325 MG tablet Take 650 mg by mouth every 6 (six) hours as needed for moderate pain.  Marland Kitchen clopidogrel (PLAVIX) 75 MG tablet Take 1 tablet (75 mg total) by mouth daily at 6 (six) AM.  . ELIQUIS 5 MG TABS tablet TAKE 1 TABLET BY MOUTH  TWICE DAILY  . gabapentin (NEURONTIN) 300 MG capsule Take 2 capsules (600 mg total) by mouth 3 (three) times daily. (Patient taking differently: Take 900 mg by mouth at bedtime as needed (pain).)  . lidocaine-prilocaine (EMLA) cream Apply to affected area once  . lisinopril (ZESTRIL) 10 MG tablet TAKE 1 TABLET BY MOUTH  DAILY  . ondansetron (ZOFRAN) 8 MG tablet Take 1 tablet (8 mg  total) by mouth 2 (two) times daily as needed for refractory nausea / vomiting. Start on day 3 after chemo.  . prochlorperazine (COMPAZINE) 10 MG tablet Take 1 tablet (10 mg total) by mouth every 6 (six) hours as needed (Nausea or vomiting).  . simvastatin (ZOCOR) 40 MG tablet TAKE 1 TABLET BY MOUTH AT  BEDTIME    Past Medical History:  Diagnosis Date  . Allergy   . Dyspnea    former smoker. only doe  . History of kidney stones   . Hypertension   . Peripheral vascular disease ([place])   . Pre-diabetes   . Sleep apnea     Past Surgical History:  Procedure Laterality Date  . ANTERIOR CERVICAL DECOMP/DISCECTOMY FUSION N/A 03/14/2020   Procedure: ANTERIOR CERVICAL DECOMPRESSION/DISCECTOMY FUSION 3 LEVELS C4-7;  Surgeon: Meade Maw, MD;  Location: [facility];  Service: Neurosurgery;  Laterality: N/A;  . BACK SURGERY  2011  . CENTRAL LINE INSERTION Right 09/10/2016   Procedure: CENTRAL LINE INSERTION;  Surgeon: Katha Cabal, MD;  Location: [facility];  Service: Vascular;  Laterality: Right;  . CERVICAL FUSION     C4-c7 on Mar 14, 2020  . COLONOSCOPY  2013 ?  . CORONARY ANGIOPLASTY    . FEMORAL-POPLITEAL BYPASS GRAFT Right 09/10/2016   Procedure: BYPASS GRAFT FEMORAL-POPLITEAL ARTERY ( BELOW KNEE );  Surgeon: Katha Cabal, MD;  Location: [facility];  Service: Vascular;  Laterality: Right;  . FEMORAL-TIBIAL BYPASS GRAFT Right 03/23/2020  Procedure: BYPASS GRAFT RIGHT FEMORAL- DISTAL TIBIAL ARTERY WITH DISTAL FLOW GRAFT;  Surgeon: Katha Cabal, MD;  Location: [facility];  Service: Vascular;  Laterality: Right;  . LOWER EXTREMITY ANGIOGRAPHY Right 11/18/2016   Procedure: Lower Extremity Angiography;  Surgeon: Katha Cabal, MD;  Location: [place] [facility];  Service: Cardiovascular;  Laterality: Right;  . LOWER EXTREMITY ANGIOGRAPHY Right 12/09/2016   Procedure: Lower Extremity Angiography;  Surgeon: Katha Cabal, MD;  Location: [place] [facility];  Service:  Cardiovascular;  Laterality: Right;  . LOWER EXTREMITY ANGIOGRAPHY Right 11/15/2019   Procedure: LOWER EXTREMITY ANGIOGRAPHY;  Surgeon: Katha Cabal, MD;  Location: [place] [facility];  Service: Cardiovascular;  Laterality: Right;  . LOWER EXTREMITY ANGIOGRAPHY Right 12/01/2019   Procedure: Lower Extremity Angiography;  Surgeon: Algernon Huxley, MD;  Location: [place] [facility];  Service: Cardiovascular;  Laterality: Right;  . LOWER EXTREMITY ANGIOGRAPHY Right 12/02/2019   Procedure: LOWER EXTREMITY ANGIOGRAPHY;  Surgeon: Katha Cabal, MD;  Location: [place] [facility];  Service: Cardiovascular;  Laterality: Right;  . LOWER EXTREMITY ANGIOGRAPHY Right 03/23/2020   Procedure: Lower Extremity Angiography;  Surgeon: Katha Cabal, MD;  Location: [place] [facility];  Service: Cardiovascular;  Laterality: Right;  . LOWER EXTREMITY INTERVENTION  11/18/2016   Procedure: Lower Extremity Intervention;  Surgeon: Katha Cabal, MD;  Location: [place] [facility];  Service: Cardiovascular;;  . PERIPHERAL VASCULAR CATHETERIZATION Right 01/02/2015   Procedure: Lower Extremity Angiography;  Surgeon: Katha Cabal, MD;  Location: [place] [facility];  Service: Cardiovascular;  Laterality: Right;  . PERIPHERAL VASCULAR CATHETERIZATION Right 06/18/2015   Procedure: Lower Extremity Angiography;  Surgeon: Algernon Huxley, MD;  Location: [place] [facility];  Service: Cardiovascular;  Laterality: Right;  . PERIPHERAL VASCULAR CATHETERIZATION  06/18/2015   Procedure: Lower Extremity Intervention;  Surgeon: Algernon Huxley, MD;  Location: [place] [facility];  Service: Cardiovascular;;  . PERIPHERAL VASCULAR CATHETERIZATION N/A 10/09/2015   Procedure: Abdominal Aortogram w/Lower Extremity;  Surgeon: Katha Cabal, MD;  Location: [place] [facility];  Service: Cardiovascular;  Laterality: N/A;  . PERIPHERAL VASCULAR CATHETERIZATION  10/09/2015   Procedure: Lower Extremity Intervention;  Surgeon:  Katha Cabal, MD;  Location: [place] [facility];  Service: Cardiovascular;;  . PERIPHERAL VASCULAR CATHETERIZATION Right 10/10/2015   Procedure: Lower Extremity Angiography;  Surgeon: Algernon Huxley, MD;  Location: [place] [facility];  Service: Cardiovascular;  Laterality: Right;  . PERIPHERAL VASCULAR CATHETERIZATION Right 10/10/2015   Procedure: Lower Extremity Intervention;  Surgeon: Algernon Huxley, MD;  Location: [place] [facility];  Service: Cardiovascular;  Laterality: Right;  . PERIPHERAL VASCULAR CATHETERIZATION Right 12/03/2015   Procedure: Lower Extremity Angiography;  Surgeon: Algernon Huxley, MD;  Location: [place] [facility];  Service: Cardiovascular;  Laterality: Right;  . PERIPHERAL VASCULAR CATHETERIZATION Right 12/04/2015   Procedure: Lower Extremity Angiography;  Surgeon: Algernon Huxley, MD;  Location: [place] [facility];  Service: Cardiovascular;  Laterality: Right;  . PERIPHERAL VASCULAR CATHETERIZATION  12/04/2015   Procedure: Lower Extremity Intervention;  Surgeon: Algernon Huxley, MD;  Location: [place] [facility];  Service: Cardiovascular;;  . PERIPHERAL VASCULAR CATHETERIZATION Right 06/30/2016   Procedure: Lower Extremity Angiography;  Surgeon: Algernon Huxley, MD;  Location: [place] [facility];  Service: Cardiovascular;  Laterality: Right;  . PERIPHERAL VASCULAR CATHETERIZATION Right 06/25/2016   Procedure: Lower Extremity Angiography;  Surgeon: Katha Cabal, MD;  Location: [place]  [facility];  Service: Cardiovascular;  Laterality: Right;  . PERIPHERAL VASCULAR CATHETERIZATION Right 07/01/2016   Procedure: Lower Extremity Angiography;  Surgeon: Katha Cabal, MD;  Location: [place] [facility];  Service: Cardiovascular;  Laterality: Right;  . PERIPHERAL VASCULAR CATHETERIZATION  07/01/2016   Procedure: Lower Extremity Intervention;  Surgeon: Katha Cabal, MD;  Location: [place] [facility];  Service: Cardiovascular;;  . PERIPHERAL VASCULAR CATHETERIZATION Right  08/01/2016   Procedure: Lower Extremity Angiography;  Surgeon: Katha Cabal, MD;  Location: [place] [facility];  Service: Cardiovascular;  Laterality: Right;  . PORTA CATH INSERTION N/A 10/05/2020   Procedure: PORTA CATH INSERTION;  Surgeon: Katha Cabal, MD;  Location: [place] [facility];  Service: Cardiovascular;  Laterality: N/A;  . stent placement in right leg Right   . VIDEO BRONCHOSCOPY N/A 09/14/2020   Procedure: VIDEO BRONCHOSCOPY WITH FLUORO;  Surgeon: Tyler Pita, MD;  Location: [facility];  Service: Cardiopulmonary;  Laterality: N/A;  . VIDEO BRONCHOSCOPY WITH ENDOBRONCHIAL ULTRASOUND N/A 09/14/2020   Procedure: VIDEO BRONCHOSCOPY WITH ENDOBRONCHIAL ULTRASOUND;  Surgeon: Tyler Pita, MD;  Location: [facility];  Service: Cardiopulmonary;  Laterality: N/A;    Social History Social History   Tobacco Use  . Smoking status: Former Smoker    Packs/day: 2.00    Years: 38.00    Pack years: 76.00    Types: Cigarettes    Quit date: 12/20/2014    Years since quitting: 5.8  . Smokeless tobacco: Former Network engineer  . Vaping Use: Never used  Substance Use Topics  . Alcohol use: Not Currently  . Drug use: No    Family History Family History  Problem Relation Age of Onset  . Diabetes Mother   . Hypertension Mother   . Heart murmur Mother   . Leukemia Mother   . Throat cancer Maternal Grandmother   . Colon cancer Neg Hx   . Breast cancer Neg Hx     Allergies  Allergen Reactions  . Tape     Honeycomb Dressing tape- burns his skin      REVIEW OF SYSTEMS (Negative unless checked)  Constitutional: [] Weight loss  [] Fever  [] Chills Cardiac: [] Chest pain   [] Chest pressure   [] Palpitations   [] Shortness of breath when laying flat   [] Shortness of breath with exertion. Vascular:  [x] Pain in legs with walking   [] Pain in legs at rest  [] History of DVT   [] Phlebitis   [] Swelling in legs   [] Varicose veins   [] Non-healing ulcers Pulmonary:   [] Uses home  oxygen   [] Productive cough   [] Hemoptysis   [] Wheeze  [] COPD   [] Asthma Neurologic:  [] Dizziness   [] Seizures   [] History of stroke   [] History of TIA  [] Aphasia   [] Vissual changes   [] Weakness or numbness in arm   [] Weakness or numbness in leg Musculoskeletal:   [] Joint swelling   [] Joint pain   [] Low back pain Hematologic:  [] Easy bruising  [] Easy bleeding   [] Hypercoagulable state   [] Anemic Gastrointestinal:  [] Diarrhea   [] Vomiting  [] Gastroesophageal reflux/heartburn   [] Difficulty swallowing. Genitourinary:  [] Chronic kidney disease   [] Difficult urination  [] Frequent urination   [] Blood in urine Skin:  [] Rashes   [] Ulcers  Psychological:  [] History of anxiety   []  History of major depression.  Physical Examination  Vitals:   10/08/20 1420  BP: 124/72  Pulse: 98  Weight: 198 lb (89.8 kg)  Height: 5\' 6"  (1.676 m)   Body  mass index is 31.96 kg/m. Gen: WD/WN, NAD Head: [place]/AT, No temporalis wasting.  Ear/Nose/Throat: Hearing grossly intact, nares w/o erythema or drainage Eyes: PER, EOMI, sclera nonicteric.  Neck: Supple, no large masses.   Pulmonary:  Good air movement, no audible wheezing bilaterally, no use of accessory muscles.  Cardiac: RRR, no JVD Vascular:  Vessel Right Left  Radial Palpable Palpable  PT Palpable Palpable  DP Not Palpable Palpable  Gastrointestinal: Non-distended. No guarding/no peritoneal signs.  Musculoskeletal: M/S 5/5 throughout.  No deformity or atrophy.  Neurologic: CN 2-12 intact. Symmetrical.  Speech is fluent. Motor exam as listed above. Psychiatric: Judgment intact, Mood & affect appropriate for pt's clinical situation. Dermatologic: No rashes or ulcers noted.  No changes consistent with cellulitis.   CBC Lab Results  Component Value Date   WBC 6.1 10/09/2020   HGB 12.0 (L) 10/09/2020   HCT 37.4 (L) 10/09/2020   MCV 81.5 10/09/2020   PLT 287 10/09/2020    BMET    Component Value Date/Time   NA 138 10/09/2020 1552   NA 141  10/14/2019 1101   K 3.7 10/09/2020 1552   CL 103 10/09/2020 1552   CO2 25 10/09/2020 1552   GLUCOSE 109 (H) 10/09/2020 1552   BUN 13 10/09/2020 1552   BUN 15 10/14/2019 1101   BUN 16 07/03/2014 1205   CREATININE 0.87 10/09/2020 1552   CREATININE 1.34 (H) 07/03/2014 1205   CALCIUM 9.0 10/09/2020 1552   GFRNONAA >60 10/09/2020 1552   GFRNONAA 59 (L) 07/03/2014 1205   GFRAA >60 03/30/2020 0536   GFRAA >60 07/03/2014 1205   Estimated Creatinine Clearance: 92.4 mL/min (by C-G formula based on SCr of 0.87 mg/dL).  COAG Lab Results  Component Value Date   INR 1.2 08/25/2020   INR 1.0 03/05/2020   INR 1.11 06/25/2016    Radiology MR Brain W Wo Contrast  Result Date: 10/03/2020 CLINICAL DATA:  Squamous cell carcinoma of right lung. EXAM: MRI HEAD WITHOUT AND WITH CONTRAST TECHNIQUE: Multiplanar, multiecho pulse sequences of the brain and surrounding structures were obtained without and with intravenous contrast. CONTRAST:  58mL GADAVIST GADOBUTROL 1 MMOL/ML IV SOLN COMPARISON:  None. FINDINGS: Brain: No acute infarction, hemorrhage, hydrocephalus, extra-axial collection or mass lesion. Scattered foci of T2 hyperintensity are seen within the white matter of the cerebral hemispheres nonspecific, most likely related to chronic microangiopathic changes. No focus of abnormal contrast enhancement. Vascular: Normal flow voids. Skull and upper cervical spine: Normal marrow signal. Sinuses/Orbits: Negative. Other: Minimal left mastoid effusion. Well-defined round lesion within the left parotid tail measuring approximately 2.3 x 1.8 x 1.8 cm without evidence of contrast enhancement (series 2, image 29; series 14, image 126; series 15, image 28). IMPRESSION: 1. No evidence of intracranial metastatic disease. 2. Scattered foci of T2 hyperintensity within the white matter of the cerebral hemispheres, nonspecific, most likely related to chronic microangiopathic changes. 3. Well-defined round lesion within the  left parotid tail measuring approximately 2.3 x 1.8 x 1.8 cm without evidence of contrast enhancement. 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 Electronically Signed   By: Pedro Earls M.D.   On: 10/03/2020 17:02   PERIPHERAL VASCULAR CATHETERIZATION  Result Date: 10/05/2020 See Op Note  NM PET Image Initial (PI) Skull Base To Thigh  Result Date: 09/19/2020 CLINICAL DATA:  Initial treatment strategy for squamous cell carcinoma of the right upper lobe. EXAM: NUCLEAR MEDICINE PET SKULL BASE TO THIGH TECHNIQUE: 10.6 mCi F-18 FDG was injected intravenously.  Full-ring PET imaging was performed from the skull base to thigh after the radiotracer. CT data was obtained and used for attenuation correction and anatomic localization. Fasting blood glucose: Ninety-two mg/dl COMPARISON:  CT chest 08/25/2020 FINDINGS: Mediastinal blood pool activity: SUV max 2.6 Liver activity: SUV max 3.7 NECK: Glottic activity is likely physiologic. No specific worrisome activity identified. Incidental CT findings: none CHEST: The central right upper lobe mass along the hilum has a similar morphology to the 08/25/2020 exam, and a maximum SUV of 11.5, compatible with malignancy. Hypermetabolic clustered right paratracheal lymph nodes are present measuring up to 1.9 cm in short axis (formerly the same) with maximum SUV 12.0. The small subcarinal lymph nodes are not appreciably hypermetabolic. Incidental CT findings: Mild left anterior descending coronary artery atherosclerosis. Minimal paraseptal emphysema along the right lung apex. ABDOMEN/PELVIS: No significant abnormal hypermetabolic activity in this region. Incidental CT findings: Aortoiliac atherosclerotic vascular disease. Vascular stents/grafts in the right upper thigh. SKELETON: No significant abnormal hypermetabolic activity in this region. Incidental CT findings: Postoperative findings in the lower lumbar spine. Cervical plate and screw  fixator. IMPRESSION: 1. Hypermetabolic central right upper lobe nodule, maximum SUV 11.5. Hypermetabolic cluster right paratracheal adenopathy with maximum SUV up to 12.0. No findings of metastatic disease to the neck, abdomen/pelvis, or skeleton. 2. Other imaging findings of potential clinical significance: Aortic Atherosclerosis (ICD10-I70.0). Coronary atherosclerosis. Electronically Signed   By: Van Clines M.D.   On: 09/19/2020 12:05   DG Chest Port 1 View  Result Date: 09/14/2020 CLINICAL DATA:  Status post biopsy EXAM: PORTABLE CHEST 1 VIEW COMPARISON:  Chest radiograph and chest CT August 26, 2019 FINDINGS: No pneumothorax. No appreciable edema or airspace opacity. The apparent mass narrowing the right upper lobe bronchus is not well appreciated by radiography. Heart size and pulmonary vascularity appear within normal limits. No adenopathy evident. There is postoperative change in the lower cervical region. IMPRESSION: No pneumothorax. No edema or airspace opacity. Lesions seen on recent CT narrowing the right upper lobe bronchus is not well appreciated by radiography. Heart size within normal limits. Electronically Signed   By: Lowella Grip III M.D.   On: 09/14/2020 15:35   VAS [place] ABI WITH/WO TBI  Result Date: 10/10/2020 LOWER EXTREMITY DOPPLER STUDY Indications: Routine f/u. Other Factors: ASO s/p multiple interventions and bypass.                11/15/2019 Right thrombectomy of Fempop insitu graft with added                stent throughout.                 12/01/2019: Aortogram and Selctive Right Lower Extremity                Angiogram. 6 mg of TPA delivered in a lysis catheterfrom the                Origin of the Right Femoral -Popliteal bypass down the proximal                ATA with placement of the lysis.                 12/02/2019: PTA of the Right Posterior Tibial Artery. PTA of the                Right Peroneal Artery. Mechanical Thrombectomy of the Right                Femoral  -  Popliteal Bypass graft. Mechanical Thrombectomy of the                Right Tibioperoneal Trunk and Posterior Tibial Artery.  Vascular Interventions: Hx of multiple right lower extremity PTA's/stents.                         09/10/2016 Right femoral to below the knee popliteal                         artery bypass graft placement. 5/01 Right bypass graft &                         mid popliteal artery PTA & coil embolectomy of large                         branch of bypass graft. 12/09/16 Right distal bypass                         graft PTA/stent and right posterior tibial artery PTA.                         03/23/2020 Rt PTA thrombectomy. Rt Profunda fem artery to                         PTA bypass. Comparison Study: 07/05/2020 Performing Technologist: Almira Coaster RVS  Examination Guidelines: A complete evaluation includes at minimum, Doppler waveform signals and systolic blood pressure reading at the level of bilateral brachial, anterior tibial, and posterior tibial arteries, when vessel segments are accessible. Bilateral testing is considered an integral part of a complete examination. Photoelectric Plethysmograph (PPG) waveforms and toe systolic pressure readings are included as required and additional duplex testing as needed. Limited examinations for reoccurring indications may be performed as noted.  ABI Findings: +---------+------------------+-----+--------+--------+ Right    Rt Pressure (mmHg)IndexWaveformComment  +---------+------------------+-----+--------+--------+ Brachial 136                                     +---------+------------------+-----+--------+--------+ ATA      136               1.00 biphasic         +---------+------------------+-----+--------+--------+ PTA      143               1.05 biphasic         +---------+------------------+-----+--------+--------+ Great Toe99                0.73 Normal           +---------+------------------+-----+--------+--------+  +---------+------------------+-----+---------+-------+ Left     Lt Pressure (mmHg)IndexWaveform Comment +---------+------------------+-----+---------+-------+ Brachial 123                                     +---------+------------------+-----+---------+-------+ ATA      161               1.18 triphasic        +---------+------------------+-----+---------+-------+ PTA      162               1.19 triphasic        +---------+------------------+-----+---------+-------+  Great Toe144               1.06 Normal           +---------+------------------+-----+---------+-------+ +-------+-----------+-----------+------------+------------+ ABI/TBIToday's ABIToday's TBIPrevious ABIPrevious TBI +-------+-----------+-----------+------------+------------+ Right  1.05       .73        1.03        .87          +-------+-----------+-----------+------------+------------+ Left   1.19       1.06       1.18        .91          +-------+-----------+-----------+------------+------------+  Bilateral ABIs appear essentially unchanged compared to prior study on 07/05/2020. Right TBIs appear decreased compared to prior study on 07/05/2020.  Summary: Right: Resting right ankle-brachial index is within normal range. No evidence of significant right lower extremity arterial disease. The right toe-brachial index is normal. Left: Resting left ankle-brachial index is within normal range. No evidence of significant left lower extremity arterial disease. The left toe-brachial index is normal.  *See table(s) above for measurements and observations.  Electronically signed by Leotis Pain MD on 10/10/2020 at 3:00:50 PM.    Final    VAS [place] LOWER EXTREMITY ARTERIAL DUPLEX  Result Date: 10/08/2020 LOWER EXTREMITY ARTERIAL DUPLEX STUDY  Current ABI: Rt 1.05,Lt 1.19 Comparison Study: 07/05/2020 Performing Technologist: Almira Coaster RVS  Examination Guidelines: A complete evaluation includes B-mode imaging,  spectral Doppler, color Doppler, and power Doppler as needed of all accessible portions of each vessel. Bilateral testing is considered an integral part of a complete examination. Limited examinations for reoccurring indications may be performed as noted.  +-----------+--------+-----+--------+--------+--------+ RIGHT      PSV cm/sRatioStenosisWaveformComments +-----------+--------+-----+--------+--------+--------+ PTA Distal 97                                    +-----------+--------+-----+--------+--------+--------+ PERO Distal15                                    +-----------+--------+-----+--------+--------+--------+  Right Graft #1: +------------------+--------+--------+--------+------------+                   PSV cm/sStenosisWaveformComments     +------------------+--------+--------+--------+------------+ Inflow            59              biphasicCFA          +------------------+--------+--------+--------+------------+ Prox Anastomosis  68              biphasicGroin area   +------------------+--------+--------+--------+------------+ Proximal Graft    26              biphasicProx Thigh   +------------------+--------+--------+--------+------------+ Mid Graft         36              biphasicMid Thigh    +------------------+--------+--------+--------+------------+ Distal Graft      40              biphasicDistal thigh +------------------+--------+--------+--------+------------+ Distal Anastomosis36              biphasicProx calf    +------------------+--------+--------+--------+------------+ Outflow           112             biphasic             +------------------+--------+--------+--------+------------+ Right Leg BPG  appears to be patent throughout.  Summary: Right: The Right Leg BPG appears to be patent throughout. Reversed flow seen in the Right ATA.  See table(s) above for measurements and observations. Electronically signed by [name] MD on 10/08/2020 at 5:14:49 PM.    Final      Assessment/Plan 1. Atherosclerosis of lower extremity with claudication ([place]) Recommend:  The patient is status post successful right leg bypass.  The patient reports that the claudication symptoms and leg pain is essentially gone.   The patient denies lifestyle limiting changes at this point in time.  No further invasive studies, angiography or surgery at this time The patient should continue walking and begin a more formal exercise program.  The patient should continue antiplatelet therapy and aggressive treatment of the lipid abnormalities  Smoking cessation was again discussed  The patient should continue wearing graduated compression socks 10-15 mmHg strength to control the mild edema.  Patient should undergo noninvasive studies as ordered. The patient will follow up with me after the studies.  - VAS [place] ABI WITH/WO TBI; Future  2. Mixed hyperlipidemia Continue statin as ordered and reviewed, no changes at this time   3. Squamous cell carcinoma of right lung [facility]) [place] has been placed     [name], MD  10/11/2020 3:40 PM

## 2020-10-12 ENCOUNTER — Encounter: Payer: Self-pay | Admitting: Oncology

## 2020-10-12 ENCOUNTER — Ambulatory Visit
Admission: RE | Admit: 2020-10-12 | Discharge: 2020-10-12 | Disposition: A | Discharge status: Home / Self Care | Payer: Medicare Other | Source: Ambulatory Visit | Attending: Radiation Oncology | Admitting: Radiation Oncology

## 2020-10-12 DIAGNOSIS — Z510 Encounter for antineoplastic radiation therapy: Secondary | ICD-10-CM | POA: Diagnosis not present

## 2020-10-12 DIAGNOSIS — Z87891 Personal history of nicotine dependence: Secondary | ICD-10-CM | POA: Diagnosis not present

## 2020-10-12 DIAGNOSIS — C3411 Malignant neoplasm of upper lobe, right bronchus or lung: Secondary | ICD-10-CM | POA: Diagnosis not present

## 2020-10-15 ENCOUNTER — Ambulatory Visit
Admission: RE | Admit: 2020-10-15 | Discharge: 2020-10-15 | Disposition: A | Discharge status: Home / Self Care | Payer: Medicare Other | Source: Ambulatory Visit | Attending: Radiation Oncology | Admitting: Radiation Oncology

## 2020-10-15 DIAGNOSIS — Z510 Encounter for antineoplastic radiation therapy: Secondary | ICD-10-CM | POA: Diagnosis not present

## 2020-10-15 DIAGNOSIS — C3411 Malignant neoplasm of upper lobe, right bronchus or lung: Secondary | ICD-10-CM | POA: Diagnosis not present

## 2020-10-16 ENCOUNTER — Encounter: Payer: Self-pay | Admitting: Pulmonary Disease

## 2020-10-16 ENCOUNTER — Other Ambulatory Visit: Payer: Self-pay

## 2020-10-16 ENCOUNTER — Ambulatory Visit
Admission: RE | Admit: 2020-10-16 | Discharge: 2020-10-16 | Disposition: A | Discharge status: Home / Self Care | Payer: Medicare Other | Source: Ambulatory Visit | Attending: Radiation Oncology | Admitting: Radiation Oncology

## 2020-10-16 ENCOUNTER — Ambulatory Visit: Payer: Medicare Other | Admitting: Pulmonary Disease

## 2020-10-16 VITALS — BP 110/58 | HR 91 | Temp 97.3°F | Ht 70.0 in | Wt 198.6 lb

## 2020-10-16 DIAGNOSIS — C3491 Malignant neoplasm of unspecified part of right bronchus or lung: Secondary | ICD-10-CM | POA: Diagnosis not present

## 2020-10-16 DIAGNOSIS — Z87891 Personal history of nicotine dependence: Secondary | ICD-10-CM | POA: Diagnosis not present

## 2020-10-16 DIAGNOSIS — J449 Chronic obstructive pulmonary disease, unspecified: Secondary | ICD-10-CM | POA: Diagnosis not present

## 2020-10-16 DIAGNOSIS — Z510 Encounter for antineoplastic radiation therapy: Secondary | ICD-10-CM | POA: Diagnosis not present

## 2020-10-16 DIAGNOSIS — C3411 Malignant neoplasm of upper lobe, right bronchus or lung: Secondary | ICD-10-CM | POA: Diagnosis not present

## 2020-10-16 MED ORDER — BREZTRI AEROSPHERE 160-9-4.8 MCG/ACT IN AERO: 160.0000 ug | INHALATION_SPRAY | Freq: Two times a day (BID) | RESPIRATORY_TRACT | 0 refills | Status: DC

## 2020-10-16 MED ORDER — BREZTRI AEROSPHERE 160-9-4.8 MCG/ACT IN AERO: 2.0000 | INHALATION_SPRAY | Freq: Two times a day (BID) | RESPIRATORY_TRACT | 11 refills | Status: DC

## 2020-10-16 NOTE — Progress Notes (Signed)
Subjective:    Patient ID: Joshua [name], male    DOB: 05-08-1956, 65 y.o.   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  Is a scheduled visit.  He bronchoscopy on 14 September 2020.  For the details of the procedure please refer to the op note from that date.  The patient has been feeling less dyspneic since the procedure was performed.  He did undergo balloon dilation of the right upper lobe.  He has started treatment for his lung cancer he is followed by Dr. Tasia Catchings and Dr. Baruch Gouty for this.  Patient also has COPD he was started on Breztri 2 puffs twice a day.  He would like to continue the medication.  He has run out of the initial samples and would like some more samples and have a prescription sent in to his pharmacy.  He does not endorse any other complaint today.   Review of Systems A 10 point review of systems was performed and it is as noted above otherwise negative.  Patient Active Problem List   Diagnosis Date Noted  . Squamous cell carcinoma of right lung ([place]) 09/20/2020  . Goals of care, counseling/discussion 08/30/2020  . Iron deficiency anemia 08/30/2020  . Lung mass 08/30/2020  . Ischemia of extremity 03/23/2020  . Cervical myelopathy ([place]) 03/14/2020  . Ischemia of right lower extremity 11/15/2019  . Peripheral neuropathy 10/14/2019  . Chronic left-sided low back pain without sciatica 04/13/2019  . History of back surgery 04/13/2019  . Overweight 04/13/2019  . 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 Complication associated with vascular device 11/13/2016  . Hyperlipidemia 06/18/2016  . Hypertension 06/18/2016  . Radiculopathy of lumbar region 06/10/2016  . PVD (peripheral vascular disease) ([place]) 05/08/2016  . 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 Prediabetes 08/25/2014   Allergies  Allergen Reactions  . Tape     Honeycomb Dressing tape- burns his skin    Current Meds  Medication Sig  . acetaminophen (TYLENOL) 325 MG tablet Take 650 mg by mouth every 6 (six) hours as needed for moderate pain.  . Budeson-Glycopyrrol-Formoterol (BREZTRI AEROSPHERE) 160-9-4.8 MCG/ACT AERO Inhale 160 mcg into the lungs in the morning and at bedtime.  . clopidogrel (PLAVIX) 75 MG tablet Take 1 tablet (75 mg total) by mouth daily at 6 (six) AM.  . ELIQUIS 5 MG TABS tablet TAKE 1 TABLET BY MOUTH  TWICE DAILY  . gabapentin (NEURONTIN) 300 MG capsule Take 2 capsules (600 mg total) by mouth 3 (three) times daily. (Patient taking differently: Take 900 mg by mouth at bedtime as needed (pain).)  . lidocaine-prilocaine (EMLA) cream Apply to affected area once  . lisinopril (ZESTRIL) 10 MG tablet TAKE 1 TABLET BY MOUTH  DAILY  . prochlorperazine (COMPAZINE) 10 MG tablet Take 1 tablet (10 mg total) by mouth every 6 (six) hours as needed (Nausea or vomiting).  . simvastatin (ZOCOR) 40 MG tablet TAKE 1 TABLET BY MOUTH AT  BEDTIME   Immunization History  Administered Date(s) Administered  . Influenza,inj,Quad PF,6+ Mos 09/14/2016, 04/13/2019, 04/16/2020  . PFIZER(Purple Top)SARS-COV-2 Vaccination 10/20/2019, 11/29/2019  . Tdap 04/16/2020       Objective:   Physical Exam BP (!) 110/58 (BP Location: Left Arm, Patient Position:  Sitting, Cuff Size: Normal)   Pulse 91   Temp (!) 97.3 F (36.3 C) (Temporal)   Ht 5\' 10"  (1.778 m)   Wt 198 lb 9.6 oz (90.1 kg)   SpO2 98%   BMI 28.50 kg/m  GENERAL: Chronically ill-appearing well-developed well-nourished gentleman, no acute distress. Walks with assistance of a cane. HEAD: Normocephalic, atraumatic.  EYES: Pupils equal, round, reactive to light.  No scleral icterus.  MOUTH: Nose/mouth/throat not examined due to masking requirements for COVID  19. NECK: Supple.  Limited ROM due to prior spinal fusion.  No thyromegaly. Trachea midline. No JVD.  No adenopathy. PULMONARY: Good air entry bilaterally.  Few rhonchi, no wheezes. CARDIOVASCULAR: S1 and S2. Regular rate and rhythm.  No rubs, murmurs or gallops heard. ABDOMEN: Benign. MUSCULOSKELETAL: No joint deformity, no clubbing, no edema.  NEUROLOGIC: No overt focal deficit, walks with assistance of a cane, speech is fluent. SKIN: Intact,warm,dry. PSYCH: Mood and behavior normal.       Assessment & Plan:     ICD-10-CM   1. COPD suggested by initial evaluation ([place])  J44.9 Budeson-Glycopyrrol-Formoterol (BREZTRI AEROSPHERE) 160-9-4.8 MCG/ACT AERO   Continue Breztri 2 puffs twice a day Patient notes improvement on the medication  2. Squamous cell carcinoma of right lung ([facility])  C34.91    Continue therapy as per Dr. Tasia Catchings and Dr. Baruch Gouty   Meds ordered this encounter  Medications  . Budeson-Glycopyrrol-Formoterol (BREZTRI AEROSPHERE) 160-9-4.8 MCG/ACT AERO    Sig: Inhale 160 mcg into the lungs in the morning and at bedtime.    Dispense:  5.9 g    Refill:  0    Order Specific Question:   Lot Number?    Answer:   [id number]    Order Specific Question:   Expiration Date?    Answer:   03/20/2022    Order Specific Question:   Quantity    Answer:   2  . Budeson-Glycopyrrol-Formoterol (BREZTRI AEROSPHERE) 160-9-4.8 MCG/ACT AERO    Sig: Inhale 2 puffs into the lungs in the morning and at bedtime.    Dispense:  10.7 g    Refill:  11   Discussion:  Patient is doing well after bronchoscopy for diagnosis of right upper lobe squamous cell carcinoma of the lung with associated mediastinal adenopathy continue management as per oncology.  Patient is currently undergoing radiation therapy and chemotherapy.  He notes improvement on COPD symptoms with Judithann Sauger, continue the same.  We will see the patient in follow-up in 2 months time, he is to contact us prior to that time should any new  difficulties arise.  Renold Don, MD [facility]   *This note was dictated using voice recognition software/Dragon.  Despite best efforts to proofread, errors can occur which can change the meaning.  Any change was purely unintentional.

## 2020-10-16 NOTE — Patient Instructions (Signed)
We have given you samples of Breztri.  A prescription will be sent to your pharmacy that will be available in about 3 weeks.  Let us know if you have trouble getting it.  We will see you in follow-up in 2 months time, call sooner should any new problems arise.

## 2020-10-17 ENCOUNTER — Inpatient Hospital Stay: Payer: Medicare Other

## 2020-10-17 ENCOUNTER — Ambulatory Visit
Admission: RE | Admit: 2020-10-17 | Discharge: 2020-10-17 | Disposition: A | Discharge status: Home / Self Care | Payer: Medicare Other | Source: Ambulatory Visit | Attending: Radiation Oncology | Admitting: Radiation Oncology

## 2020-10-17 ENCOUNTER — Encounter: Payer: Self-pay | Admitting: Oncology

## 2020-10-17 ENCOUNTER — Encounter: Payer: Self-pay | Admitting: *Deleted

## 2020-10-17 ENCOUNTER — Inpatient Hospital Stay (HOSPITAL_BASED_OUTPATIENT_CLINIC_OR_DEPARTMENT_OTHER): Payer: Medicare Other | Admitting: Oncology

## 2020-10-17 VITALS — BP 126/74 | HR 89 | Temp 97.7°F | Resp 18 | Wt 201.5 lb

## 2020-10-17 DIAGNOSIS — C3491 Malignant neoplasm of unspecified part of right bronchus or lung: Secondary | ICD-10-CM | POA: Diagnosis not present

## 2020-10-17 DIAGNOSIS — T451X5A Adverse effect of antineoplastic and immunosuppressive drugs, initial encounter: Secondary | ICD-10-CM

## 2020-10-17 DIAGNOSIS — C3411 Malignant neoplasm of upper lobe, right bronchus or lung: Secondary | ICD-10-CM | POA: Diagnosis not present

## 2020-10-17 DIAGNOSIS — Z5111 Encounter for antineoplastic chemotherapy: Secondary | ICD-10-CM | POA: Diagnosis not present

## 2020-10-17 DIAGNOSIS — D6481 Anemia due to antineoplastic chemotherapy: Secondary | ICD-10-CM | POA: Diagnosis not present

## 2020-10-17 DIAGNOSIS — Z87891 Personal history of nicotine dependence: Secondary | ICD-10-CM | POA: Diagnosis not present

## 2020-10-17 DIAGNOSIS — I739 Peripheral vascular disease, unspecified: Secondary | ICD-10-CM | POA: Diagnosis not present

## 2020-10-17 DIAGNOSIS — Z86718 Personal history of other venous thrombosis and embolism: Secondary | ICD-10-CM | POA: Diagnosis not present

## 2020-10-17 DIAGNOSIS — Z7901 Long term (current) use of anticoagulants: Secondary | ICD-10-CM | POA: Diagnosis not present

## 2020-10-17 DIAGNOSIS — Z510 Encounter for antineoplastic radiation therapy: Secondary | ICD-10-CM | POA: Diagnosis not present

## 2020-10-17 DIAGNOSIS — I743 Embolism and thrombosis of arteries of the lower extremities: Secondary | ICD-10-CM

## 2020-10-17 LAB — CBC WITH DIFFERENTIAL/PLATELET
Abs Immature Granulocytes: 0.02 10*3/uL (ref 0.00–0.07)
Basophils Absolute: 0.1 10*3/uL (ref 0.0–0.1)
Basophils Relative: 1 %
Eosinophils Absolute: 0.2 10*3/uL (ref 0.0–0.5)
Eosinophils Relative: 4 %
HCT: 36.2 % — ABNORMAL LOW (ref 39.0–52.0)
Hemoglobin: 11.6 g/dL — ABNORMAL LOW (ref 13.0–17.0)
Immature Granulocytes: 0 %
Lymphocytes Relative: 18 %
Lymphs Abs: 0.9 10*3/uL (ref 0.7–4.0)
MCH: 26.1 pg (ref 26.0–34.0)
MCHC: 32 g/dL (ref 30.0–36.0)
MCV: 81.5 fL (ref 80.0–100.0)
Monocytes Absolute: 0.3 10*3/uL (ref 0.1–1.0)
Monocytes Relative: 5 %
Neutro Abs: 3.7 10*3/uL (ref 1.7–7.7)
Neutrophils Relative %: 72 %
Platelets: 258 10*3/uL (ref 150–400)
RBC: 4.44 MIL/uL (ref 4.22–5.81)
RDW: 15.7 % — ABNORMAL HIGH (ref 11.5–15.5)
WBC: 5.2 10*3/uL (ref 4.0–10.5)
nRBC: 0 % (ref 0.0–0.2)

## 2020-10-17 LAB — COMPREHENSIVE METABOLIC PANEL
ALT: 20 U/L (ref 0–44)
AST: 24 U/L (ref 15–41)
Albumin: 3.7 g/dL (ref 3.5–5.0)
Alkaline Phosphatase: 79 U/L (ref 38–126)
Anion gap: 9 (ref 5–15)
BUN: 16 mg/dL (ref 8–23)
CO2: 26 mmol/L (ref 22–32)
Calcium: 8.8 mg/dL — ABNORMAL LOW (ref 8.9–10.3)
Chloride: 103 mmol/L (ref 98–111)
Creatinine, Ser: 0.93 mg/dL (ref 0.61–1.24)
GFR, Estimated: >60 mL/min (ref >60)
Glucose, Bld: 137 mg/dL — ABNORMAL HIGH (ref 70–99)
Potassium: 4 mmol/L (ref 3.5–5.1)
Sodium: 138 mmol/L (ref 135–145)
Total Bilirubin: 0.5 mg/dL (ref 0.3–1.2)
Total Protein: 7.3 g/dL (ref 6.5–8.1)

## 2020-10-17 MED ORDER — PALONOSETRON HCL INJECTION 0.25 MG/5ML
0.2500 mg | Freq: Once | INTRAVENOUS | Status: AC
  Administered 2020-10-17: 0.25 mg via INTRAVENOUS
  Filled 2020-10-17: qty 5

## 2020-10-17 MED ORDER — DIPHENHYDRAMINE HCL 50 MG/ML IJ SOLN
25.0000 mg | Freq: Once | INTRAMUSCULAR | Status: AC
  Administered 2020-10-17: 25 mg via INTRAVENOUS
  Filled 2020-10-17: qty 1

## 2020-10-17 MED ORDER — SODIUM CHLORIDE 0.9 % IV SOLN
Freq: Once | INTRAVENOUS | Status: AC
  Filled 2020-10-17: qty 250

## 2020-10-17 MED ORDER — SODIUM CHLORIDE 0.9 % IV SOLN
45.0000 mg/m2 | Freq: Once | INTRAVENOUS | Status: AC
  Administered 2020-10-17: 96 mg via INTRAVENOUS
  Filled 2020-10-17: qty 16

## 2020-10-17 MED ORDER — HEPARIN SOD (PORK) LOCK FLUSH 100 UNIT/ML IV SOLN
500.0000 [IU] | Freq: Once | INTRAVENOUS | Status: DC | PRN
  Filled 2020-10-17: qty 5

## 2020-10-17 MED ORDER — SODIUM CHLORIDE 0.9% FLUSH
10.0000 mL | INTRAVENOUS | Status: DC | PRN
  Administered 2020-10-17: 10 mL via INTRAVENOUS
  Filled 2020-10-17: qty 10

## 2020-10-17 MED ORDER — HEPARIN SOD (PORK) LOCK FLUSH 100 UNIT/ML IV SOLN
500.0000 [IU] | Freq: Once | INTRAVENOUS | Status: AC
  Administered 2020-10-17: 500 [IU] via INTRAVENOUS
  Filled 2020-10-17: qty 5

## 2020-10-17 MED ORDER — SODIUM CHLORIDE 0.9 % IV SOLN
20.0000 mg | Freq: Once | INTRAVENOUS | Status: AC
  Administered 2020-10-17: 20 mg via INTRAVENOUS
  Filled 2020-10-17: qty 20

## 2020-10-17 MED ORDER — FAMOTIDINE 20 MG IN NS 100 ML IVPB
20.0000 mg | Freq: Once | INTRAVENOUS | Status: AC
  Administered 2020-10-17: 20 mg via INTRAVENOUS
  Filled 2020-10-17: qty 20

## 2020-10-17 MED ORDER — SODIUM CHLORIDE 0.9 % IV SOLN
260.6000 mg | Freq: Once | INTRAVENOUS | Status: AC
  Administered 2020-10-17: 260 mg via INTRAVENOUS
  Filled 2020-10-17: qty 26

## 2020-10-17 MED ORDER — HEPARIN SOD (PORK) LOCK FLUSH 100 UNIT/ML IV SOLN
INTRAVENOUS | Status: AC
  Filled 2020-10-17: qty 5

## 2020-10-17 NOTE — Progress Notes (Signed)
Hematology/Oncology Consult note [facility] Telephone:([phone] [phone]   Patient Care Team: Virginia Crews, MD as PCP - General (Family Medicine) [name], MD as Referring Physician (Physician Assistant) Bary Castilla, [name], MD (General Surgery) Telford Nab, RN as Oncology Nurse Navigator  REFERRING PROVIDER: Virginia Crews, MD  CHIEF COMPLAINTS/REASON FOR VISIT:  Follow up for lung cancer  HISTORY OF PRESENTING ILLNESS:   Joshua [name] is a  65 y.o.  male with PMH listed below was seen in consultation at the request of  Virginia Crews, MD  for evaluation of lung mass  Patient presented to emergency room on 08/25/2020 for evaluation of hemoptysis, coughing.  He coughed up small amount of blood and a large clot.  No fever, chills, chest pain, abdominal pain.  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  Patient is on Plavix and Eliquis.  Since his ER visit, he has not coughed up more blood.  08/25/2020 CT angio chest PE with and without contrast showed ill-defined soft tissue adjacent to the right upper lobe bronchus resulting in narrowing of right upper lobe bronchus.  Also right paratracheal mediastinal adenopathy.  Scattered centrilobular groundglass opacities within the right lower lobe and to a lesser extent within the right lower lobe likely reflecting an infectious or inflammatory process.  Patient lives by himself.  He has a brother who lives close by.  Patient's mother passed away recently.  57-pack-year smoking history.  He quitted smoking in 2016.  # 09/14/2020, right upper lobe lung biopsy and bronchoscopy brushing are both positive for squamous cell carcinoma.Right precarinal space lymph node insufficient lymph node sampling for staging. 09/18/2020, PET scan showed right upper lobe  nodule maximal SUV 11.5.  Hypermetabolic cluster right paratracheal adenopathy with maximum SUV up to 12.  No findings of metastatic disease to the neck/abdomen/pelvis or skeleton. Today patient presents to discuss results. No new complaints  # 10/03/2000, MRI brain with and without contrast showed no evidence of intracranial metastatic disease. 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   INTERVAL HISTORY Joshua [name] is a 65 y.o. male who has above history reviewed by me today presents for follow up visit for Stage III lung squamous cell carcinoma. Problems and complaints are listed below: He tolerates chemotherapy last week. No nausea vomiting diarrhea.    Review of Systems  Constitutional: Positive for fatigue. Negative for appetite change, chills, fever and unexpected weight change.  HENT:   Negative for hearing loss and voice change.   Eyes: Negative for eye problems and icterus.  Respiratory: Positive for cough. Negative for chest tightness, hemoptysis and shortness of breath.   Cardiovascular: Negative for chest pain and leg swelling.  Gastrointestinal: Negative for abdominal distention and abdominal pain.  Endocrine: Negative for hot flashes.  Genitourinary: Negative for difficulty urinating, dysuria and frequency.   Musculoskeletal: Negative for arthralgias.  Skin: Negative for itching and rash.  Neurological: Negative for light-headedness and numbness.  Hematological: Negative for adenopathy. Does not bruise/bleed easily.  Psychiatric/Behavioral: Negative for confusion.    MEDICAL HISTORY:  Past Medical History:  Diagnosis Date  . Allergy   . Dyspnea    former smoker. only doe  . History of kidney stones   . Hypertension   . Peripheral vascular disease ([place])   .  Pre-diabetes   . Sleep apnea     SURGICAL HISTORY: Past Surgical History:  Procedure Laterality Date   . ANTERIOR CERVICAL DECOMP/DISCECTOMY FUSION N/A 03/14/2020   Procedure: ANTERIOR CERVICAL DECOMPRESSION/DISCECTOMY FUSION 3 LEVELS C4-7;  Surgeon: Meade Maw, MD;  Location: [facility];  Service: Neurosurgery;  Laterality: N/A;  . BACK SURGERY  2011  . CENTRAL LINE INSERTION Right 09/10/2016   Procedure: CENTRAL LINE INSERTION;  Surgeon: Katha Cabal, MD;  Location: [facility];  Service: Vascular;  Laterality: Right;  . CERVICAL FUSION     C4-c7 on Mar 14, 2020  . COLONOSCOPY  2013 ?  . CORONARY ANGIOPLASTY    . FEMORAL-POPLITEAL BYPASS GRAFT Right 09/10/2016   Procedure: BYPASS GRAFT FEMORAL-POPLITEAL ARTERY ( BELOW KNEE );  Surgeon: Katha Cabal, MD;  Location: [facility];  Service: Vascular;  Laterality: Right;  . FEMORAL-TIBIAL BYPASS GRAFT Right 03/23/2020   Procedure: BYPASS GRAFT RIGHT FEMORAL- DISTAL TIBIAL ARTERY WITH DISTAL FLOW GRAFT;  Surgeon: Katha Cabal, MD;  Location: [facility];  Service: Vascular;  Laterality: Right;  . LOWER EXTREMITY ANGIOGRAPHY Right 11/18/2016   Procedure: Lower Extremity Angiography;  Surgeon: Katha Cabal, MD;  Location: [place] [facility];  Service: Cardiovascular;  Laterality: Right;  . LOWER EXTREMITY ANGIOGRAPHY Right 12/09/2016   Procedure: Lower Extremity Angiography;  Surgeon: Katha Cabal, MD;  Location: [place] [facility];  Service: Cardiovascular;  Laterality: Right;  . LOWER EXTREMITY ANGIOGRAPHY Right 11/15/2019   Procedure: LOWER EXTREMITY ANGIOGRAPHY;  Surgeon: Katha Cabal, MD;  Location: [place] [facility];  Service: Cardiovascular;  Laterality: Right;  . LOWER EXTREMITY ANGIOGRAPHY Right 12/01/2019   Procedure: Lower Extremity Angiography;  Surgeon: Algernon Huxley, MD;  Location: [place] [facility];  Service: Cardiovascular;  Laterality: Right;  . LOWER EXTREMITY ANGIOGRAPHY Right 12/02/2019   Procedure: LOWER EXTREMITY ANGIOGRAPHY;  Surgeon: Katha Cabal, MD;  Location: [place] [facility];   Service: Cardiovascular;  Laterality: Right;  . LOWER EXTREMITY ANGIOGRAPHY Right 03/23/2020   Procedure: Lower Extremity Angiography;  Surgeon: Katha Cabal, MD;  Location: [place] [facility];  Service: Cardiovascular;  Laterality: Right;  . LOWER EXTREMITY INTERVENTION  11/18/2016   Procedure: Lower Extremity Intervention;  Surgeon: Katha Cabal, MD;  Location: [place] [facility];  Service: Cardiovascular;;  . PERIPHERAL VASCULAR CATHETERIZATION Right 01/02/2015   Procedure: Lower Extremity Angiography;  Surgeon: Katha Cabal, MD;  Location: [place] [facility];  Service: Cardiovascular;  Laterality: Right;  . PERIPHERAL VASCULAR CATHETERIZATION Right 06/18/2015   Procedure: Lower Extremity Angiography;  Surgeon: Algernon Huxley, MD;  Location: [place] [facility];  Service: Cardiovascular;  Laterality: Right;  . PERIPHERAL VASCULAR CATHETERIZATION  06/18/2015   Procedure: Lower Extremity Intervention;  Surgeon: Algernon Huxley, MD;  Location: [place] [facility];  Service: Cardiovascular;;  . PERIPHERAL VASCULAR CATHETERIZATION N/A 10/09/2015   Procedure: Abdominal Aortogram w/Lower Extremity;  Surgeon: Katha Cabal, MD;  Location: [place] [facility];  Service: Cardiovascular;  Laterality: N/A;  . PERIPHERAL VASCULAR CATHETERIZATION  10/09/2015   Procedure: Lower Extremity Intervention;  Surgeon: Katha Cabal, MD;  Location: [place] [facility];  Service: Cardiovascular;;  . PERIPHERAL VASCULAR CATHETERIZATION Right 10/10/2015   Procedure: Lower Extremity Angiography;  Surgeon: Algernon Huxley, MD;  Location: [place] [facility];  Service: Cardiovascular;  Laterality: Right;  . PERIPHERAL VASCULAR CATHETERIZATION Right 10/10/2015   Procedure: Lower Extremity Intervention;  Surgeon: Algernon Huxley, MD;  Location: [place] [facility];  Service: Cardiovascular;  Laterality: Right;  . PERIPHERAL VASCULAR CATHETERIZATION Right 12/03/2015   Procedure: Lower Extremity Angiography;   Surgeon: Algernon Huxley, MD;  Location: [place] [facility];  Service: Cardiovascular;  Laterality: Right;  . PERIPHERAL VASCULAR CATHETERIZATION Right 12/04/2015   Procedure: Lower Extremity Angiography;  Surgeon: Algernon Huxley, MD;  Location: [place] [facility];  Service: Cardiovascular;  Laterality: Right;  . PERIPHERAL VASCULAR CATHETERIZATION  12/04/2015   Procedure: Lower Extremity Intervention;  Surgeon: Algernon Huxley, MD;  Location: [place] [facility];  Service: Cardiovascular;;  . PERIPHERAL VASCULAR CATHETERIZATION Right 06/30/2016   Procedure: Lower Extremity Angiography;  Surgeon: Algernon Huxley, MD;  Location: [place] [facility];  Service: Cardiovascular;  Laterality: Right;  . PERIPHERAL VASCULAR CATHETERIZATION Right 06/25/2016   Procedure: Lower Extremity Angiography;  Surgeon: Katha Cabal, MD;  Location: [place] [facility];  Service: Cardiovascular;  Laterality: Right;  . PERIPHERAL VASCULAR CATHETERIZATION Right 07/01/2016   Procedure: Lower Extremity Angiography;  Surgeon: Katha Cabal, MD;  Location: [place] [facility];  Service: Cardiovascular;  Laterality: Right;  . PERIPHERAL VASCULAR CATHETERIZATION  07/01/2016   Procedure: Lower Extremity Intervention;  Surgeon: Katha Cabal, MD;  Location: [place] [facility];  Service: Cardiovascular;;  . PERIPHERAL VASCULAR CATHETERIZATION Right 08/01/2016   Procedure: Lower Extremity Angiography;  Surgeon: Katha Cabal, MD;  Location: [place] [facility];  Service: Cardiovascular;  Laterality: Right;  . PORTA CATH INSERTION N/A 10/05/2020   Procedure: PORTA CATH INSERTION;  Surgeon: Katha Cabal, MD;  Location: [place] [facility];  Service: Cardiovascular;  Laterality: N/A;  . stent placement in right leg Right   . VIDEO BRONCHOSCOPY N/A 09/14/2020   Procedure: VIDEO BRONCHOSCOPY WITH FLUORO;  Surgeon: Tyler Pita, MD;  Location: [facility];  Service: Cardiopulmonary;  Laterality: N/A;  . VIDEO  BRONCHOSCOPY WITH ENDOBRONCHIAL ULTRASOUND N/A 09/14/2020   Procedure: VIDEO BRONCHOSCOPY WITH ENDOBRONCHIAL ULTRASOUND;  Surgeon: Tyler Pita, MD;  Location: [facility];  Service: Cardiopulmonary;  Laterality: N/A;    SOCIAL HISTORY: Social History   Socioeconomic History  . Marital status: Single    Spouse name: Not on file  . Number of children: Not on file  . Years of education: Not on file  . Highest education level: Not on file  Occupational History  . Occupation: unemployed  Tobacco Use  . Smoking status: Former Smoker    Packs/day: 2.00    Years: 38.00    Pack years: 76.00    Types: Cigarettes    Quit date: 12/20/2014    Years since quitting: 5.8  . Smokeless tobacco: Former Systems developer  . Tobacco comment: quit smoking in 2017 most smoked 2   Vaping Use  . Vaping Use: Never used  Substance and Sexual Activity  . Alcohol use: Not Currently  . Drug use: No  . Sexual activity: Yes  Other Topics Concern  . Not on file  Social History Narrative  . Not on file   Social Determinants of Health   Financial Resource Strain: Not on file  Food Insecurity: Not on file  Transportation Needs: Not on file  Physical Activity: Not on file  Stress: Not on file  Social Connections: Not on file  Intimate Partner Violence: Not on file    FAMILY HISTORY: Family History  Problem Relation Age of Onset  . Diabetes Mother   . Hypertension Mother   . Heart murmur Mother   .  Leukemia Mother   . Throat cancer Maternal Grandmother   . Colon cancer Neg Hx   . Breast cancer Neg Hx     ALLERGIES:  is allergic to tape.  MEDICATIONS:  Current Outpatient Medications  Medication Sig Dispense Refill  . acetaminophen (TYLENOL) 325 MG tablet Take 650 mg by mouth every 6 (six) hours as needed for moderate pain.    . Budeson-Glycopyrrol-Formoterol (BREZTRI AEROSPHERE) 160-9-4.8 MCG/ACT AERO Inhale 160 mcg into the lungs in the morning and at bedtime. 5.9 g 0  . [START ON 11/06/2020]  Budeson-Glycopyrrol-Formoterol (BREZTRI AEROSPHERE) 160-9-4.8 MCG/ACT AERO Inhale 2 puffs into the lungs in the morning and at bedtime. 10.7 g 11  . clopidogrel (PLAVIX) 75 MG tablet Take 1 tablet (75 mg total) by mouth daily at 6 (six) AM. 60 tablet 1  . ELIQUIS 5 MG TABS tablet TAKE 1 TABLET BY MOUTH  TWICE DAILY 180 tablet 3  . gabapentin (NEURONTIN) 300 MG capsule Take 2 capsules (600 mg total) by mouth 3 (three) times daily. (Patient taking differently: Take 900 mg by mouth at bedtime as needed (pain).) 540 capsule 1  . lidocaine-prilocaine (EMLA) cream Apply to affected area once 30 g 3  . lisinopril (ZESTRIL) 10 MG tablet TAKE 1 TABLET BY MOUTH  DAILY 90 tablet 0  . ondansetron (ZOFRAN) 8 MG tablet Take 1 tablet (8 mg total) by mouth 2 (two) times daily as needed for refractory nausea / vomiting. Start on day 3 after chemo. (Patient not taking: Reported on 10/16/2020) 30 tablet 1  . prochlorperazine (COMPAZINE) 10 MG tablet Take 1 tablet (10 mg total) by mouth every 6 (six) hours as needed (Nausea or vomiting). 30 tablet 1  . simvastatin (ZOCOR) 40 MG tablet TAKE 1 TABLET BY MOUTH AT  BEDTIME 90 tablet 0   No current facility-administered medications for this visit.   Facility-Administered Medications Ordered in Other Visits  Medication Dose Route Frequency Provider Last Rate Last Admin  . heparin lock flush 100 unit/mL  500 Units Intravenous Once [name], MD      . sodium chloride flush (NS) 0.9 % injection 10 mL  10 mL Intravenous PRN [name], MD   10 mL at 10/17/20 0828     PHYSICAL EXAMINATION: ECOG PERFORMANCE STATUS: 1 - Symptomatic but completely ambulatory Vitals:   10/17/20 0849  BP: 126/74  Pulse: 89  Resp: 18  Temp: 97.7 F (36.5 C)   Filed Weights   10/17/20 0849  Weight: 201 lb 8 oz (91.4 kg)    Physical Exam Constitutional:      General: He is not in acute distress.    Comments: Walks with a cane.   HENT:     Head: Normocephalic and atraumatic.  Eyes:      General: No scleral icterus. Cardiovascular:     Rate and Rhythm: Normal rate and regular rhythm.     Heart sounds: Normal heart sounds.  Pulmonary:     Effort: Pulmonary effort is normal. No respiratory distress.     Breath sounds: No wheezing.  Abdominal:     General: Bowel sounds are normal. There is no distension.     Palpations: Abdomen is soft.  Musculoskeletal:        General: No deformity. Normal range of motion.     Cervical back: Normal range of motion and neck supple.  Skin:    General: Skin is warm and dry.     Findings: No erythema or rash.  Neurological:  Mental Status: He is alert and oriented to person, place, and time. Mental status is at baseline.     Cranial Nerves: No cranial nerve deficit.     Coordination: Coordination normal.  Psychiatric:        Mood and Affect: Mood normal.     LABORATORY DATA:  I have reviewed the data as listed Lab Results  Component Value Date   WBC 6.1 10/09/2020   HGB 12.0 (L) 10/09/2020   HCT 37.4 (L) 10/09/2020   MCV 81.5 10/09/2020   PLT 287 10/09/2020   Recent Labs    03/28/20 0421 03/29/20 0444 03/30/20 0536 04/16/20 1525 08/25/20 1512 10/09/20 1552  NA 133* 139 140  --  139 138  K 4.1 4.0 4.1  --  3.8 3.7  CL 101 103 102  --  105 103  CO2 $Re'28 28 28  'NyV$ --  25 25  GLUCOSE 116* 101* 99  --  117* 109*  BUN $Re'11 12 12  'kGJ$ --  17 13  CREATININE 0.76 0.85 0.76  --  0.97 0.87  CALCIUM 8.2* 8.4* 8.6*  --  9.2 9.0  GFRNONAA >60 >60 >60  --  >60 >60  GFRAA >60 >60 >60  --   --   --   PROT  --   --   --  7.0 7.7 7.6  ALBUMIN  --   --   --  4.3 4.0 3.9  AST  --   --   --  $R'17 24 21  'sf$ ALT  --   --   --  $R'19 18 16  'QB$ ALKPHOS  --   --   --  123* 99 95  BILITOT  --   --   --  0.4 0.6 0.5  BILIDIR  --   --   --  0.10  --   --    Iron/TIBC/Ferritin/ %Sat    Component Value Date/Time   IRON 48 08/30/2020 1052   TIBC 391 08/30/2020 1052   FERRITIN 25 08/30/2020 1052   IRONPCTSAT 12 (L) 08/30/2020 1052      RADIOGRAPHIC  STUDIES: I have personally reviewed the radiological images as listed and agreed with the findings in the report. MR Brain W Wo Contrast  Result Date: 10/03/2020 CLINICAL DATA:  Squamous cell carcinoma of right lung. EXAM: MRI HEAD WITHOUT AND WITH CONTRAST TECHNIQUE: Multiplanar, multiecho pulse sequences of the brain and surrounding structures were obtained without and with intravenous contrast. CONTRAST:  37mL GADAVIST GADOBUTROL 1 MMOL/ML IV SOLN COMPARISON:  None. FINDINGS: Brain: No acute infarction, hemorrhage, hydrocephalus, extra-axial collection or mass lesion. Scattered foci of T2 hyperintensity are seen within the white matter of the cerebral hemispheres nonspecific, most likely related to chronic microangiopathic changes. No focus of abnormal contrast enhancement. Vascular: Normal flow voids. Skull and upper cervical spine: Normal marrow signal. Sinuses/Orbits: Negative. Other: Minimal left mastoid effusion. Well-defined round lesion within the left parotid tail measuring approximately 2.3 x 1.8 x 1.8 cm without evidence of contrast enhancement (series 2, image 29; series 14, image 126; series 15, image 28). IMPRESSION: 1. No evidence of intracranial metastatic disease. 2. Scattered foci of T2 hyperintensity within the white matter of the cerebral hemispheres, nonspecific, most likely related to chronic microangiopathic changes. 3. Well-defined round lesion within the left parotid tail measuring approximately 2.3 x 1.8 x 1.8 cm without evidence of contrast enhancement. 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 Electronically Signed   By: Sandre Kitty.D.  On: 10/03/2020 17:02   PERIPHERAL VASCULAR CATHETERIZATION  Result Date: 10/05/2020 See Op Note  NM PET Image Initial (PI) Skull Base To Thigh  Result Date: 09/19/2020 CLINICAL DATA:  Initial treatment strategy for squamous cell carcinoma of the right upper lobe. EXAM: NUCLEAR MEDICINE PET SKULL BASE  TO THIGH TECHNIQUE: 10.6 mCi F-18 FDG was injected intravenously. Full-ring PET imaging was performed from the skull base to thigh after the radiotracer. CT data was obtained and used for attenuation correction and anatomic localization. Fasting blood glucose: Ninety-two mg/dl COMPARISON:  CT chest 08/25/2020 FINDINGS: Mediastinal blood pool activity: SUV max 2.6 Liver activity: SUV max 3.7 NECK: Glottic activity is likely physiologic. No specific worrisome activity identified. Incidental CT findings: none CHEST: The central right upper lobe mass along the hilum has a similar morphology to the 08/25/2020 exam, and a maximum SUV of 11.5, compatible with malignancy. Hypermetabolic clustered right paratracheal lymph nodes are present measuring up to 1.9 cm in short axis (formerly the same) with maximum SUV 12.0. The small subcarinal lymph nodes are not appreciably hypermetabolic. Incidental CT findings: Mild left anterior descending coronary artery atherosclerosis. Minimal paraseptal emphysema along the right lung apex. ABDOMEN/PELVIS: No significant abnormal hypermetabolic activity in this region. Incidental CT findings: Aortoiliac atherosclerotic vascular disease. Vascular stents/grafts in the right upper thigh. SKELETON: No significant abnormal hypermetabolic activity in this region. Incidental CT findings: Postoperative findings in the lower lumbar spine. Cervical plate and screw fixator. IMPRESSION: 1. Hypermetabolic central right upper lobe nodule, maximum SUV 11.5. Hypermetabolic cluster right paratracheal adenopathy with maximum SUV up to 12.0. No findings of metastatic disease to the neck, abdomen/pelvis, or skeleton. 2. Other imaging findings of potential clinical significance: Aortic Atherosclerosis (ICD10-I70.0). Coronary atherosclerosis. Electronically Signed   By: Van Clines M.D.   On: 09/19/2020 12:05   VAS [place] ABI WITH/WO TBI  Result Date: 10/10/2020 LOWER EXTREMITY DOPPLER STUDY Indications:  Routine f/u. Other Factors: ASO s/p multiple interventions and bypass.                11/15/2019 Right thrombectomy of Fempop insitu graft with added                stent throughout.                 12/01/2019: Aortogram and Selctive Right Lower Extremity                Angiogram. 6 mg of TPA delivered in a lysis catheterfrom the                Origin of the Right Femoral -Popliteal bypass down the proximal                ATA with placement of the lysis.                 12/02/2019: PTA of the Right Posterior Tibial Artery. PTA of the                Right Peroneal Artery. Mechanical Thrombectomy of the Right                Femoral -Popliteal Bypass graft. Mechanical Thrombectomy of the                Right Tibioperoneal Trunk and Posterior Tibial Artery.  Vascular Interventions: Hx of multiple right lower extremity PTA's/stents.  09/10/2016 Right femoral to below the knee popliteal                         artery bypass graft placement. 5/01 Right bypass graft &                         mid popliteal artery PTA & coil embolectomy of large                         branch of bypass graft. 12/09/16 Right distal bypass                         graft PTA/stent and right posterior tibial artery PTA.                         03/23/2020 Rt PTA thrombectomy. Rt Profunda fem artery to                         PTA bypass. Comparison Study: 07/05/2020 Performing Technologist: Almira Coaster RVS  Examination Guidelines: A complete evaluation includes at minimum, Doppler waveform signals and systolic blood pressure reading at the level of bilateral brachial, anterior tibial, and posterior tibial arteries, when vessel segments are accessible. Bilateral testing is considered an integral part of a complete examination. Photoelectric Plethysmograph (PPG) waveforms and toe systolic pressure readings are included as required and additional duplex testing as needed. Limited examinations for reoccurring indications may be  performed as noted.  ABI Findings: +---------+------------------+-----+--------+--------+ Right    Rt Pressure (mmHg)IndexWaveformComment  +---------+------------------+-----+--------+--------+ Brachial 136                                     +---------+------------------+-----+--------+--------+ ATA      136               1.00 biphasic         +---------+------------------+-----+--------+--------+ PTA      143               1.05 biphasic         +---------+------------------+-----+--------+--------+ Great Toe99                0.73 Normal           +---------+------------------+-----+--------+--------+ +---------+------------------+-----+---------+-------+ Left     Lt Pressure (mmHg)IndexWaveform Comment +---------+------------------+-----+---------+-------+ Brachial 123                                     +---------+------------------+-----+---------+-------+ ATA      161               1.18 triphasic        +---------+------------------+-----+---------+-------+ PTA      162               1.19 triphasic        +---------+------------------+-----+---------+-------+ Great Toe144               1.06 Normal           +---------+------------------+-----+---------+-------+ +-------+-----------+-----------+------------+------------+ ABI/TBIToday's ABIToday's TBIPrevious ABIPrevious TBI +-------+-----------+-----------+------------+------------+ Right  1.05       .73        1.03        .87          +-------+-----------+-----------+------------+------------+  Left   1.19       1.06       1.18        .91          +-------+-----------+-----------+------------+------------+  Bilateral ABIs appear essentially unchanged compared to prior study on 07/05/2020. Right TBIs appear decreased compared to prior study on 07/05/2020.  Summary: Right: Resting right ankle-brachial index is within normal range. No evidence of significant right lower extremity arterial  disease. The right toe-brachial index is normal. Left: Resting left ankle-brachial index is within normal range. No evidence of significant left lower extremity arterial disease. The left toe-brachial index is normal.  *See table(s) above for measurements and observations.  Electronically signed by Leotis Pain MD on 10/10/2020 at 3:00:50 PM.    Final    VAS [place] LOWER EXTREMITY ARTERIAL DUPLEX  Result Date: 10/08/2020 LOWER EXTREMITY ARTERIAL DUPLEX STUDY  Current ABI: Rt 1.05,Lt 1.19 Comparison Study: 07/05/2020 Performing Technologist: Almira Coaster RVS  Examination Guidelines: A complete evaluation includes B-mode imaging, spectral Doppler, color Doppler, and power Doppler as needed of all accessible portions of each vessel. Bilateral testing is considered an integral part of a complete examination. Limited examinations for reoccurring indications may be performed as noted.  +-----------+--------+-----+--------+--------+--------+ RIGHT      PSV cm/sRatioStenosisWaveformComments +-----------+--------+-----+--------+--------+--------+ PTA Distal 97                                    +-----------+--------+-----+--------+--------+--------+ PERO Distal15                                    +-----------+--------+-----+--------+--------+--------+  Right Graft #1: +------------------+--------+--------+--------+------------+                   PSV cm/sStenosisWaveformComments     +------------------+--------+--------+--------+------------+ Inflow            59              biphasicCFA          +------------------+--------+--------+--------+------------+ Prox Anastomosis  68              biphasicGroin area   +------------------+--------+--------+--------+------------+ Proximal Graft    26              biphasicProx Thigh   +------------------+--------+--------+--------+------------+ Mid Graft         36              biphasicMid Thigh     +------------------+--------+--------+--------+------------+ Distal Graft      40              biphasicDistal thigh +------------------+--------+--------+--------+------------+ Distal Anastomosis36              biphasicProx calf    +------------------+--------+--------+--------+------------+ Outflow           112             biphasic             +------------------+--------+--------+--------+------------+ Right Leg BPG appears to be patent throughout.  Summary: Right: The Right Leg BPG appears to be patent throughout. Reversed flow seen in the Right ATA.  See table(s) above for measurements and observations. Electronically signed by [name] MD on 10/08/2020 at 5:14:49 PM.    Final       ASSESSMENT & PLAN:  1. Squamous cell carcinoma of right lung ([facility])   2. Encounter for antineoplastic  chemotherapy   3. 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 Antineoplastic chemotherapy induced anemia   Cancer Staging Squamous cell carcinoma of right lung [facility]) Staging form: Lung, AJCC 8th Edition - Clinical stage from 09/20/2020: Stage IIIA (cT1b, cN2, cM0) - Signed by [name], MD on 09/20/2020   #Squamous cell carcinoma of right lung, stage IIIA, Labs are reviewed and discussed with patient.  Proceed with cycle 1 carboplatin and Taxol.  He has started concurrent radiation. - NGS showed PD-L1 TPS 95%, LRP1B S936fs, PIK3CA E545K, RB1 c1422-2A>T, RIT1 T83_ A84del, STK11 P242fs, TPS53 R248W, TMB 19.54mut/mb, MS stable.  Labs are reviewed and discussed with patient. Proceed with weekly carboplatin and Taxol.   # Anemia, likely due to chemotherapy.  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 #Chronic anticoagulation with Eliquis and Plavix due to peripheral artery disease/embolism.  All questions were answered. The patient knows to call the clinic with any problems questions or concerns.  cc Brita Romp Dionne Bucy, MD    Return of visit: 1 week  [name], MD,  PhD Hematology Oncology [facility] Pager- [phone] 10/17/2020

## 2020-10-17 NOTE — Progress Notes (Signed)
  Oncology Nurse Navigator Documentation  Navigator Location: [facility]Med [facility]c (10/17/20 0900)   )Navigator Encounter Type: Follow-up Appt (10/17/20 0900)                     Patient Visit Type: MedOnc (10/17/20 0900) Treatment Phase: Treatment (10/17/20 0900) Barriers/Navigation Needs: No Barriers At This Time;No Questions;No Needs (10/17/20 0900)   Interventions: None Required (10/17/20 0900)         met with patient during follow up visit with Dr. Tasia Catchings. Pt tolerated first treatment well. Does not have any questions or needs today. Pt informed to call with any further questions or needs. Pt verbalized understanding.              Time Spent with Patient: 30 (10/17/20 0900)

## 2020-10-17 NOTE — Progress Notes (Signed)
Patient has occasional constipation and has added Senna 1 QD prn.

## 2020-10-17 NOTE — Progress Notes (Signed)
Per MD decrease benadryl dose to 25mg 

## 2020-10-18 ENCOUNTER — Ambulatory Visit
Admission: RE | Admit: 2020-10-18 | Discharge: 2020-10-18 | Disposition: A | Discharge status: Home / Self Care | Payer: Medicare Other | Source: Ambulatory Visit | Attending: Radiation Oncology | Admitting: Radiation Oncology

## 2020-10-18 DIAGNOSIS — Z510 Encounter for antineoplastic radiation therapy: Secondary | ICD-10-CM | POA: Diagnosis not present

## 2020-10-18 DIAGNOSIS — C3411 Malignant neoplasm of upper lobe, right bronchus or lung: Secondary | ICD-10-CM | POA: Diagnosis not present

## 2020-10-18 DIAGNOSIS — Z87891 Personal history of nicotine dependence: Secondary | ICD-10-CM | POA: Diagnosis not present

## 2020-10-19 ENCOUNTER — Ambulatory Visit
Admission: RE | Admit: 2020-10-19 | Discharge: 2020-10-19 | Disposition: A | Discharge status: Home / Self Care | Payer: Medicare Other | Source: Ambulatory Visit | Attending: Radiation Oncology | Admitting: Radiation Oncology

## 2020-10-19 DIAGNOSIS — Z510 Encounter for antineoplastic radiation therapy: Secondary | ICD-10-CM | POA: Diagnosis present

## 2020-10-19 DIAGNOSIS — C3411 Malignant neoplasm of upper lobe, right bronchus or lung: Secondary | ICD-10-CM | POA: Insufficient documentation

## 2020-10-22 ENCOUNTER — Ambulatory Visit
Admission: RE | Admit: 2020-10-22 | Discharge: 2020-10-22 | Disposition: A | Discharge status: Home / Self Care | Payer: Medicare Other | Source: Ambulatory Visit | Attending: Radiation Oncology | Admitting: Radiation Oncology

## 2020-10-22 DIAGNOSIS — Z510 Encounter for antineoplastic radiation therapy: Secondary | ICD-10-CM | POA: Diagnosis not present

## 2020-10-23 ENCOUNTER — Ambulatory Visit
Admission: RE | Admit: 2020-10-23 | Discharge: 2020-10-23 | Disposition: A | Discharge status: Home / Self Care | Payer: Medicare Other | Source: Ambulatory Visit | Attending: Radiation Oncology | Admitting: Radiation Oncology

## 2020-10-23 DIAGNOSIS — Z510 Encounter for antineoplastic radiation therapy: Secondary | ICD-10-CM | POA: Diagnosis not present

## 2020-10-24 ENCOUNTER — Inpatient Hospital Stay: Payer: Medicare Other

## 2020-10-24 ENCOUNTER — Inpatient Hospital Stay (HOSPITAL_BASED_OUTPATIENT_CLINIC_OR_DEPARTMENT_OTHER): Payer: Medicare Other | Admitting: Oncology

## 2020-10-24 ENCOUNTER — Inpatient Hospital Stay: Payer: Medicare Other | Attending: Oncology

## 2020-10-24 ENCOUNTER — Encounter: Payer: Self-pay | Admitting: Oncology

## 2020-10-24 ENCOUNTER — Ambulatory Visit
Admission: RE | Admit: 2020-10-24 | Discharge: 2020-10-24 | Disposition: A | Discharge status: Home / Self Care | Payer: Medicare Other | Source: Ambulatory Visit | Attending: Radiation Oncology | Admitting: Radiation Oncology

## 2020-10-24 ENCOUNTER — Other Ambulatory Visit: Payer: Self-pay

## 2020-10-24 VITALS — BP 118/76 | HR 90 | Temp 98.5°F | Resp 20 | Wt 198.4 lb

## 2020-10-24 DIAGNOSIS — T451X5A Adverse effect of antineoplastic and immunosuppressive drugs, initial encounter: Secondary | ICD-10-CM

## 2020-10-24 DIAGNOSIS — D696 Thrombocytopenia, unspecified: Secondary | ICD-10-CM | POA: Insufficient documentation

## 2020-10-24 DIAGNOSIS — Z5111 Encounter for antineoplastic chemotherapy: Secondary | ICD-10-CM | POA: Diagnosis not present

## 2020-10-24 DIAGNOSIS — C3411 Malignant neoplasm of upper lobe, right bronchus or lung: Secondary | ICD-10-CM | POA: Diagnosis present

## 2020-10-24 DIAGNOSIS — I959 Hypotension, unspecified: Secondary | ICD-10-CM | POA: Insufficient documentation

## 2020-10-24 DIAGNOSIS — C3491 Malignant neoplasm of unspecified part of right bronchus or lung: Secondary | ICD-10-CM

## 2020-10-24 DIAGNOSIS — D701 Agranulocytosis secondary to cancer chemotherapy: Secondary | ICD-10-CM | POA: Insufficient documentation

## 2020-10-24 DIAGNOSIS — Z7901 Long term (current) use of anticoagulants: Secondary | ICD-10-CM | POA: Diagnosis not present

## 2020-10-24 DIAGNOSIS — K208 Other esophagitis without bleeding: Secondary | ICD-10-CM | POA: Diagnosis not present

## 2020-10-24 DIAGNOSIS — Z510 Encounter for antineoplastic radiation therapy: Secondary | ICD-10-CM | POA: Diagnosis not present

## 2020-10-24 DIAGNOSIS — D6481 Anemia due to antineoplastic chemotherapy: Secondary | ICD-10-CM | POA: Diagnosis not present

## 2020-10-24 DIAGNOSIS — I743 Embolism and thrombosis of arteries of the lower extremities: Secondary | ICD-10-CM | POA: Insufficient documentation

## 2020-10-24 DIAGNOSIS — I739 Peripheral vascular disease, unspecified: Secondary | ICD-10-CM | POA: Insufficient documentation

## 2020-10-24 DIAGNOSIS — Z452 Encounter for adjustment and management of vascular access device: Secondary | ICD-10-CM | POA: Insufficient documentation

## 2020-10-24 LAB — CBC WITH DIFFERENTIAL/PLATELET
Abs Immature Granulocytes: 0.01 10*3/uL (ref 0.00–0.07)
Basophils Absolute: 0.1 10*3/uL (ref 0.0–0.1)
Basophils Relative: 2 %
Eosinophils Absolute: 0.1 10*3/uL (ref 0.0–0.5)
Eosinophils Relative: 3 %
HCT: 35.1 % — ABNORMAL LOW (ref 39.0–52.0)
Hemoglobin: 11.5 g/dL — ABNORMAL LOW (ref 13.0–17.0)
Immature Granulocytes: 0 %
Lymphocytes Relative: 19 %
Lymphs Abs: 0.6 10*3/uL — ABNORMAL LOW (ref 0.7–4.0)
MCH: 26.9 pg (ref 26.0–34.0)
MCHC: 32.8 g/dL (ref 30.0–36.0)
MCV: 82 fL (ref 80.0–100.0)
Monocytes Absolute: 0.3 10*3/uL (ref 0.1–1.0)
Monocytes Relative: 11 %
Neutro Abs: 2.1 10*3/uL (ref 1.7–7.7)
Neutrophils Relative %: 65 %
Platelets: 200 10*3/uL (ref 150–400)
RBC: 4.28 MIL/uL (ref 4.22–5.81)
RDW: 15.6 % — ABNORMAL HIGH (ref 11.5–15.5)
WBC: 3.2 10*3/uL — ABNORMAL LOW (ref 4.0–10.5)
nRBC: 0 % (ref 0.0–0.2)

## 2020-10-24 LAB — COMPREHENSIVE METABOLIC PANEL
ALT: 26 U/L (ref 0–44)
AST: 24 U/L (ref 15–41)
Albumin: 3.8 g/dL (ref 3.5–5.0)
Alkaline Phosphatase: 74 U/L (ref 38–126)
Anion gap: 9 (ref 5–15)
BUN: 21 mg/dL (ref 8–23)
CO2: 24 mmol/L (ref 22–32)
Calcium: 8.9 mg/dL (ref 8.9–10.3)
Chloride: 105 mmol/L (ref 98–111)
Creatinine, Ser: 1.03 mg/dL (ref 0.61–1.24)
GFR, Estimated: >60 mL/min (ref >60)
Glucose, Bld: 96 mg/dL (ref 70–99)
Potassium: 4 mmol/L (ref 3.5–5.1)
Sodium: 138 mmol/L (ref 135–145)
Total Bilirubin: 0.6 mg/dL (ref 0.3–1.2)
Total Protein: 7.2 g/dL (ref 6.5–8.1)

## 2020-10-24 MED ORDER — HEPARIN SOD (PORK) LOCK FLUSH 100 UNIT/ML IV SOLN
INTRAVENOUS | Status: AC
  Filled 2020-10-24: qty 5

## 2020-10-24 MED ORDER — PALONOSETRON HCL INJECTION 0.25 MG/5ML
0.2500 mg | Freq: Once | INTRAVENOUS | Status: AC
  Administered 2020-10-24: 0.25 mg via INTRAVENOUS
  Filled 2020-10-24: qty 5

## 2020-10-24 MED ORDER — SODIUM CHLORIDE 0.9 % IV SOLN
45.0000 mg/m2 | Freq: Once | INTRAVENOUS | Status: AC
  Administered 2020-10-24: 96 mg via INTRAVENOUS
  Filled 2020-10-24: qty 16

## 2020-10-24 MED ORDER — SODIUM CHLORIDE 0.9 % IV SOLN
Freq: Once | INTRAVENOUS | Status: AC
  Filled 2020-10-24: qty 250

## 2020-10-24 MED ORDER — SODIUM CHLORIDE 0.9 % IV SOLN
20.0000 mg | Freq: Once | INTRAVENOUS | Status: AC
  Administered 2020-10-24: 20 mg via INTRAVENOUS
  Filled 2020-10-24: qty 20

## 2020-10-24 MED ORDER — SODIUM CHLORIDE 0.9% FLUSH
10.0000 mL | INTRAVENOUS | Status: DC | PRN
  Filled 2020-10-24: qty 10

## 2020-10-24 MED ORDER — HEPARIN SOD (PORK) LOCK FLUSH 100 UNIT/ML IV SOLN
500.0000 [IU] | Freq: Once | INTRAVENOUS | Status: AC | PRN
  Administered 2020-10-24: 500 [IU]
  Filled 2020-10-24: qty 5

## 2020-10-24 MED ORDER — FAMOTIDINE 20 MG IN NS 100 ML IVPB
20.0000 mg | Freq: Once | INTRAVENOUS | Status: AC
  Administered 2020-10-24: 20 mg via INTRAVENOUS
  Filled 2020-10-24: qty 20

## 2020-10-24 MED ORDER — DIPHENHYDRAMINE HCL 50 MG/ML IJ SOLN
25.0000 mg | Freq: Once | INTRAMUSCULAR | Status: AC
  Administered 2020-10-24: 25 mg via INTRAVENOUS
  Filled 2020-10-24: qty 1

## 2020-10-24 MED ORDER — SODIUM CHLORIDE 0.9 % IV SOLN
240.0000 mg | Freq: Once | INTRAVENOUS | Status: AC
  Administered 2020-10-24: 240 mg via INTRAVENOUS
  Filled 2020-10-24: qty 24

## 2020-10-24 NOTE — Progress Notes (Signed)
Hematology/Oncology Consult note [facility] Telephone:([phone] [phone]   Patient Care Team: Virginia Crews, MD as PCP - General (Family Medicine) [name], MD as Referring Physician (Physician Assistant) Bary Castilla, [name], MD (General Surgery) Telford Nab, RN as Oncology Nurse Navigator  REFERRING PROVIDER: Virginia Crews, MD  CHIEF COMPLAINTS/REASON FOR VISIT:  Follow up for lung cancer  HISTORY OF PRESENTING ILLNESS:   Joshua [name] is a  65 y.o.  male with PMH listed below was seen in consultation at the request of  Virginia Crews, MD  for evaluation of lung mass  Patient presented to emergency room on 08/25/2020 for evaluation of hemoptysis, coughing.  He coughed up small amount of blood and a large clot.  No fever, chills, chest pain, abdominal pain.  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  Patient is on Plavix and Eliquis.  Since his ER visit, he has not coughed up more blood.  08/25/2020 CT angio chest PE with and without contrast showed ill-defined soft tissue adjacent to the right upper lobe bronchus resulting in narrowing of right upper lobe bronchus.  Also right paratracheal mediastinal adenopathy.  Scattered centrilobular groundglass opacities within the right lower lobe and to a lesser extent within the right lower lobe likely reflecting an infectious or inflammatory process.  Patient lives by himself.  He has a brother who lives close by.  Patient's mother passed away recently.  57-pack-year smoking history.  He quitted smoking in 2016.  # 09/14/2020, right upper lobe lung biopsy and bronchoscopy brushing are both positive for squamous cell carcinoma.Right precarinal space lymph node insufficient lymph node sampling for staging. 09/18/2020, PET scan showed right upper lobe  nodule maximal SUV 11.5.  Hypermetabolic cluster right paratracheal adenopathy with maximum SUV up to 12.  No findings of metastatic disease to the neck/abdomen/pelvis or skeleton. Today patient presents to discuss results. No new complaints  # 10/03/2000, MRI brain with and without contrast showed no evidence of intracranial metastatic disease. 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   INTERVAL HISTORY Joshua [name] is a 65 y.o. male who has above history reviewed by me today presents for follow up visit for Stage III lung squamous cell carcinoma. Problems and complaints are listed below: He tolerates chemotherapy last week.  Denies any nausea vomiting diarrhea, no shortness of breath or cough. Occasionally he has heartburn.  Review of Systems  Constitutional: Positive for fatigue. Negative for appetite change, chills, fever and unexpected weight change.  HENT:   Negative for hearing loss and voice change.   Eyes: Negative for eye problems and icterus.  Respiratory: Negative for chest tightness, cough, hemoptysis and shortness of breath.   Cardiovascular: Negative for chest pain and leg swelling.  Gastrointestinal: Negative for abdominal distention and abdominal pain.  Endocrine: Negative for hot flashes.  Genitourinary: Negative for difficulty urinating, dysuria and frequency.   Musculoskeletal: Negative for arthralgias.  Skin: Negative for itching and rash.  Neurological: Negative for light-headedness and numbness.  Hematological: Negative for adenopathy. Does not bruise/bleed easily.  Psychiatric/Behavioral: Negative for confusion.    MEDICAL HISTORY:  Past Medical History:  Diagnosis Date  . Allergy   . Dyspnea    former smoker. only doe  . History of kidney stones   . Hypertension   .  Peripheral vascular disease ([place])   . Pre-diabetes   . Sleep apnea     SURGICAL  HISTORY: Past Surgical History:  Procedure Laterality Date  . ANTERIOR CERVICAL DECOMP/DISCECTOMY FUSION N/A 03/14/2020   Procedure: ANTERIOR CERVICAL DECOMPRESSION/DISCECTOMY FUSION 3 LEVELS C4-7;  Surgeon: Meade Maw, MD;  Location: [facility];  Service: Neurosurgery;  Laterality: N/A;  . BACK SURGERY  2011  . CENTRAL LINE INSERTION Right 09/10/2016   Procedure: CENTRAL LINE INSERTION;  Surgeon: Katha Cabal, MD;  Location: [facility];  Service: Vascular;  Laterality: Right;  . CERVICAL FUSION     C4-c7 on Mar 14, 2020  . COLONOSCOPY  2013 ?  . CORONARY ANGIOPLASTY    . FEMORAL-POPLITEAL BYPASS GRAFT Right 09/10/2016   Procedure: BYPASS GRAFT FEMORAL-POPLITEAL ARTERY ( BELOW KNEE );  Surgeon: Katha Cabal, MD;  Location: [facility];  Service: Vascular;  Laterality: Right;  . FEMORAL-TIBIAL BYPASS GRAFT Right 03/23/2020   Procedure: BYPASS GRAFT RIGHT FEMORAL- DISTAL TIBIAL ARTERY WITH DISTAL FLOW GRAFT;  Surgeon: Katha Cabal, MD;  Location: [facility];  Service: Vascular;  Laterality: Right;  . LOWER EXTREMITY ANGIOGRAPHY Right 11/18/2016   Procedure: Lower Extremity Angiography;  Surgeon: Katha Cabal, MD;  Location: [place] [facility];  Service: Cardiovascular;  Laterality: Right;  . LOWER EXTREMITY ANGIOGRAPHY Right 12/09/2016   Procedure: Lower Extremity Angiography;  Surgeon: Katha Cabal, MD;  Location: [place] [facility];  Service: Cardiovascular;  Laterality: Right;  . LOWER EXTREMITY ANGIOGRAPHY Right 11/15/2019   Procedure: LOWER EXTREMITY ANGIOGRAPHY;  Surgeon: Katha Cabal, MD;  Location: [place] [facility];  Service: Cardiovascular;  Laterality: Right;  . LOWER EXTREMITY ANGIOGRAPHY Right 12/01/2019   Procedure: Lower Extremity Angiography;  Surgeon: Algernon Huxley, MD;  Location: [place] [facility];  Service: Cardiovascular;  Laterality: Right;  . LOWER EXTREMITY ANGIOGRAPHY Right 12/02/2019   Procedure: LOWER EXTREMITY ANGIOGRAPHY;  Surgeon:  Katha Cabal, MD;  Location: [place] [facility];  Service: Cardiovascular;  Laterality: Right;  . LOWER EXTREMITY ANGIOGRAPHY Right 03/23/2020   Procedure: Lower Extremity Angiography;  Surgeon: Katha Cabal, MD;  Location: [place] [facility];  Service: Cardiovascular;  Laterality: Right;  . LOWER EXTREMITY INTERVENTION  11/18/2016   Procedure: Lower Extremity Intervention;  Surgeon: Katha Cabal, MD;  Location: [place] [facility];  Service: Cardiovascular;;  . PERIPHERAL VASCULAR CATHETERIZATION Right 01/02/2015   Procedure: Lower Extremity Angiography;  Surgeon: Katha Cabal, MD;  Location: [place] [facility];  Service: Cardiovascular;  Laterality: Right;  . PERIPHERAL VASCULAR CATHETERIZATION Right 06/18/2015   Procedure: Lower Extremity Angiography;  Surgeon: Algernon Huxley, MD;  Location: [place] [facility];  Service: Cardiovascular;  Laterality: Right;  . PERIPHERAL VASCULAR CATHETERIZATION  06/18/2015   Procedure: Lower Extremity Intervention;  Surgeon: Algernon Huxley, MD;  Location: [place] [facility];  Service: Cardiovascular;;  . PERIPHERAL VASCULAR CATHETERIZATION N/A 10/09/2015   Procedure: Abdominal Aortogram w/Lower Extremity;  Surgeon: Katha Cabal, MD;  Location: [place] [facility];  Service: Cardiovascular;  Laterality: N/A;  . PERIPHERAL VASCULAR CATHETERIZATION  10/09/2015   Procedure: Lower Extremity Intervention;  Surgeon: Katha Cabal, MD;  Location: [place] [facility];  Service: Cardiovascular;;  . PERIPHERAL VASCULAR CATHETERIZATION Right 10/10/2015   Procedure: Lower Extremity Angiography;  Surgeon: Algernon Huxley, MD;  Location: [place] [facility];  Service: Cardiovascular;  Laterality: Right;  . PERIPHERAL VASCULAR CATHETERIZATION Right 10/10/2015   Procedure: Lower Extremity Intervention;  Surgeon: Algernon Huxley, MD;  Location: [place] [facility];  Service: Cardiovascular;  Laterality: Right;  . PERIPHERAL VASCULAR CATHETERIZATION Right  12/03/2015   Procedure: Lower Extremity Angiography;  Surgeon: Algernon Huxley, MD;  Location: [place] [facility];  Service: Cardiovascular;  Laterality: Right;  . PERIPHERAL VASCULAR CATHETERIZATION Right 12/04/2015   Procedure: Lower Extremity Angiography;  Surgeon: Algernon Huxley, MD;  Location: [place] [facility];  Service: Cardiovascular;  Laterality: Right;  . PERIPHERAL VASCULAR CATHETERIZATION  12/04/2015   Procedure: Lower Extremity Intervention;  Surgeon: Algernon Huxley, MD;  Location: [place] [facility];  Service: Cardiovascular;;  . PERIPHERAL VASCULAR CATHETERIZATION Right 06/30/2016   Procedure: Lower Extremity Angiography;  Surgeon: Algernon Huxley, MD;  Location: [place] [facility];  Service: Cardiovascular;  Laterality: Right;  . PERIPHERAL VASCULAR CATHETERIZATION Right 06/25/2016   Procedure: Lower Extremity Angiography;  Surgeon: Katha Cabal, MD;  Location: [place] [facility];  Service: Cardiovascular;  Laterality: Right;  . PERIPHERAL VASCULAR CATHETERIZATION Right 07/01/2016   Procedure: Lower Extremity Angiography;  Surgeon: Katha Cabal, MD;  Location: [place] [facility];  Service: Cardiovascular;  Laterality: Right;  . PERIPHERAL VASCULAR CATHETERIZATION  07/01/2016   Procedure: Lower Extremity Intervention;  Surgeon: Katha Cabal, MD;  Location: [place] [facility];  Service: Cardiovascular;;  . PERIPHERAL VASCULAR CATHETERIZATION Right 08/01/2016   Procedure: Lower Extremity Angiography;  Surgeon: Katha Cabal, MD;  Location: [place] [facility];  Service: Cardiovascular;  Laterality: Right;  . PORTA CATH INSERTION N/A 10/05/2020   Procedure: PORTA CATH INSERTION;  Surgeon: Katha Cabal, MD;  Location: [place] [facility];  Service: Cardiovascular;  Laterality: N/A;  . stent placement in right leg Right   . VIDEO BRONCHOSCOPY N/A 09/14/2020   Procedure: VIDEO BRONCHOSCOPY WITH FLUORO;  Surgeon: Tyler Pita, MD;  Location: [facility];  Service:  Cardiopulmonary;  Laterality: N/A;  . VIDEO BRONCHOSCOPY WITH ENDOBRONCHIAL ULTRASOUND N/A 09/14/2020   Procedure: VIDEO BRONCHOSCOPY WITH ENDOBRONCHIAL ULTRASOUND;  Surgeon: Tyler Pita, MD;  Location: [facility];  Service: Cardiopulmonary;  Laterality: N/A;    SOCIAL HISTORY: Social History   Socioeconomic History  . Marital status: Single    Spouse name: Not on file  . Number of children: Not on file  . Years of education: Not on file  . Highest education level: Not on file  Occupational History  . Occupation: unemployed  Tobacco Use  . Smoking status: Former Smoker    Packs/day: 2.00    Years: 38.00    Pack years: 76.00    Types: Cigarettes    Quit date: 12/20/2014    Years since quitting: 5.8  . Smokeless tobacco: Former Systems developer  . Tobacco comment: quit smoking in 2017 most smoked 2   Vaping Use  . Vaping Use: Never used  Substance and Sexual Activity  . Alcohol use: Not Currently  . Drug use: No  . Sexual activity: Yes  Other Topics Concern  . Not on file  Social History Narrative  . Not on file   Social Determinants of Health   Financial Resource Strain: Not on file  Food Insecurity: Not on file  Transportation Needs: Not on file  Physical Activity: Not on file  Stress: Not on file  Social Connections: Not on file  Intimate Partner Violence: Not on file    FAMILY HISTORY: Family History  Problem Relation Age of Onset  . Diabetes Mother   . Hypertension Mother   .  Heart murmur Mother   . Leukemia Mother   . Throat cancer Maternal Grandmother   . Colon cancer Neg Hx   . Breast cancer Neg Hx     ALLERGIES:  is allergic to tape.  MEDICATIONS:  Current Outpatient Medications  Medication Sig Dispense Refill  . acetaminophen (TYLENOL) 325 MG tablet Take 650 mg by mouth every 6 (six) hours as needed for moderate pain.    . Budeson-Glycopyrrol-Formoterol (BREZTRI AEROSPHERE) 160-9-4.8 MCG/ACT AERO Inhale 160 mcg into the lungs in the morning and at  bedtime. 5.9 g 0  . clopidogrel (PLAVIX) 75 MG tablet Take 1 tablet (75 mg total) by mouth daily at 6 (six) AM. 60 tablet 1  . ELIQUIS 5 MG TABS tablet TAKE 1 TABLET BY MOUTH  TWICE DAILY 180 tablet 3  . gabapentin (NEURONTIN) 300 MG capsule Take 2 capsules (600 mg total) by mouth 3 (three) times daily. (Patient taking differently: Take 900 mg by mouth at bedtime as needed (pain).) 540 capsule 1  . lidocaine-prilocaine (EMLA) cream Apply to affected area once 30 g 3  . lisinopril (ZESTRIL) 10 MG tablet TAKE 1 TABLET BY MOUTH  DAILY 90 tablet 0  . sennosides-docusate sodium (SENOKOT-S) 8.6-50 MG tablet Take 1 tablet by mouth daily as needed for constipation.    . simvastatin (ZOCOR) 40 MG tablet TAKE 1 TABLET BY MOUTH AT  BEDTIME 90 tablet 0  . ondansetron (ZOFRAN) 8 MG tablet Take 1 tablet (8 mg total) by mouth 2 (two) times daily as needed for refractory nausea / vomiting. Start on day 3 after chemo. (Patient not taking: No sig reported) 30 tablet 1  . prochlorperazine (COMPAZINE) 10 MG tablet Take 1 tablet (10 mg total) by mouth every 6 (six) hours as needed (Nausea or vomiting). (Patient not taking: No sig reported) 30 tablet 1   No current facility-administered medications for this visit.     PHYSICAL EXAMINATION: ECOG PERFORMANCE STATUS: 1 - Symptomatic but completely ambulatory Vitals:   10/24/20 0951  BP: 118/76  Pulse: 90  Resp: 20  Temp: 98.5 F (36.9 C)  SpO2: 100%   Filed Weights   10/24/20 0951  Weight: 198 lb 6.4 oz (90 kg)    Physical Exam Constitutional:      General: He is not in acute distress.    Comments: Walks with a cane.   HENT:     Head: Normocephalic and atraumatic.  Eyes:     General: No scleral icterus. Cardiovascular:     Rate and Rhythm: Normal rate and regular rhythm.     Heart sounds: Normal heart sounds.  Pulmonary:     Effort: Pulmonary effort is normal. No respiratory distress.     Breath sounds: No wheezing.  Abdominal:     General:  Bowel sounds are normal. There is no distension.     Palpations: Abdomen is soft.  Musculoskeletal:        General: No deformity. Normal range of motion.     Cervical back: Normal range of motion and neck supple.  Skin:    General: Skin is warm and dry.     Findings: No erythema or rash.  Neurological:     Mental Status: He is alert and oriented to person, place, and time. Mental status is at baseline.     Cranial Nerves: No cranial nerve deficit.     Coordination: Coordination normal.  Psychiatric:        Mood and Affect: Mood normal.  LABORATORY DATA:  I have reviewed the data as listed Lab Results  Component Value Date   WBC 3.2 (L) 10/24/2020   HGB 11.5 (L) 10/24/2020   HCT 35.1 (L) 10/24/2020   MCV 82.0 10/24/2020   PLT 200 10/24/2020   Recent Labs    03/28/20 0421 03/29/20 0444 03/30/20 0536 04/16/20 1525 08/25/20 1512 10/09/20 1552 10/17/20 0828 10/24/20 0844  NA 133* 139 140  --    < > 138 138 138  K 4.1 4.0 4.1  --    < > 3.7 4.0 4.0  CL 101 103 102  --    < > 103 103 105  CO2 $Re'28 28 28  'PSa$ --    < > $R'25 26 24  'Tj$ GLUCOSE 116* 101* 99  --    < > 109* 137* 96  BUN $Re'11 12 12  'qnG$ --    < > $R'13 16 21  'oL$ CREATININE 0.76 0.85 0.76  --    < > 0.87 0.93 1.03  CALCIUM 8.2* 8.4* 8.6*  --    < > 9.0 8.8* 8.9  GFRNONAA >60 >60 >60  --    < > >60 >60 >60  GFRAA >60 >60 >60  --   --   --   --   --   PROT  --   --   --  7.0   < > 7.6 7.3 7.2  ALBUMIN  --   --   --  4.3   < > 3.9 3.7 3.8  AST  --   --   --  17   < > $R'21 24 24  'lH$ ALT  --   --   --  19   < > $R'16 20 26  'kF$ ALKPHOS  --   --   --  123*   < > 95 79 74  BILITOT  --   --   --  0.4   < > 0.5 0.5 0.6  BILIDIR  --   --   --  0.10  --   --   --   --    < > = values in this interval not displayed.   Iron/TIBC/Ferritin/ %Sat    Component Value Date/Time   IRON 48 08/30/2020 1052   TIBC 391 08/30/2020 1052   FERRITIN 25 08/30/2020 1052   IRONPCTSAT 12 (L) 08/30/2020 1052      RADIOGRAPHIC STUDIES: I have personally reviewed  the radiological images as listed and agreed with the findings in the report. MR Brain W Wo Contrast  Result Date: 10/03/2020 CLINICAL DATA:  Squamous cell carcinoma of right lung. EXAM: MRI HEAD WITHOUT AND WITH CONTRAST TECHNIQUE: Multiplanar, multiecho pulse sequences of the brain and surrounding structures were obtained without and with intravenous contrast. CONTRAST:  74mL GADAVIST GADOBUTROL 1 MMOL/ML IV SOLN COMPARISON:  None. FINDINGS: Brain: No acute infarction, hemorrhage, hydrocephalus, extra-axial collection or mass lesion. Scattered foci of T2 hyperintensity are seen within the white matter of the cerebral hemispheres nonspecific, most likely related to chronic microangiopathic changes. No focus of abnormal contrast enhancement. Vascular: Normal flow voids. Skull and upper cervical spine: Normal marrow signal. Sinuses/Orbits: Negative. Other: Minimal left mastoid effusion. Well-defined round lesion within the left parotid tail measuring approximately 2.3 x 1.8 x 1.8 cm without evidence of contrast enhancement (series 2, image 29; series 14, image 126; series 15, image 28). IMPRESSION: 1. No evidence of intracranial metastatic disease. 2. Scattered foci of T2 hyperintensity within the white matter of the cerebral  hemispheres, nonspecific, most likely related to chronic microangiopathic changes. 3. Well-defined round lesion within the left parotid tail measuring approximately 2.3 x 1.8 x 1.8 cm without evidence of contrast enhancement. 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 Electronically Signed   By: Pedro Earls M.D.   On: 10/03/2020 17:02   PERIPHERAL VASCULAR CATHETERIZATION  Result Date: 10/05/2020 See Op Note  VAS [place] ABI WITH/WO TBI  Result Date: 10/10/2020 LOWER EXTREMITY DOPPLER STUDY Indications: Routine f/u. Other Factors: ASO s/p multiple interventions and bypass.                11/15/2019 Right thrombectomy of Fempop insitu graft with added                 stent throughout.                 12/01/2019: Aortogram and Selctive Right Lower Extremity                Angiogram. 6 mg of TPA delivered in a lysis catheterfrom the                Origin of the Right Femoral -Popliteal bypass down the proximal                ATA with placement of the lysis.                 12/02/2019: PTA of the Right Posterior Tibial Artery. PTA of the                Right Peroneal Artery. Mechanical Thrombectomy of the Right                Femoral -Popliteal Bypass graft. Mechanical Thrombectomy of the                Right Tibioperoneal Trunk and Posterior Tibial Artery.  Vascular Interventions: Hx of multiple right lower extremity PTA's/stents.                         09/10/2016 Right femoral to below the knee popliteal                         artery bypass graft placement. 5/01 Right bypass graft &                         mid popliteal artery PTA & coil embolectomy of large                         branch of bypass graft. 12/09/16 Right distal bypass                         graft PTA/stent and right posterior tibial artery PTA.                         03/23/2020 Rt PTA thrombectomy. Rt Profunda fem artery to                         PTA bypass. Comparison Study: 07/05/2020 Performing Technologist: Almira Coaster RVS  Examination Guidelines: A complete evaluation includes at minimum, Doppler waveform signals and systolic blood pressure reading at the level of bilateral brachial, anterior tibial, and posterior tibial arteries, when vessel segments are accessible. Bilateral testing  is considered an integral part of a complete examination. Photoelectric Plethysmograph (PPG) waveforms and toe systolic pressure readings are included as required and additional duplex testing as needed. Limited examinations for reoccurring indications may be performed as noted.  ABI Findings: +---------+------------------+-----+--------+--------+ Right    Rt Pressure (mmHg)IndexWaveformComment   +---------+------------------+-----+--------+--------+ Brachial 136                                     +---------+------------------+-----+--------+--------+ ATA      136               1.00 biphasic         +---------+------------------+-----+--------+--------+ PTA      143               1.05 biphasic         +---------+------------------+-----+--------+--------+ Great Toe99                0.73 Normal           +---------+------------------+-----+--------+--------+ +---------+------------------+-----+---------+-------+ Left     Lt Pressure (mmHg)IndexWaveform Comment +---------+------------------+-----+---------+-------+ Brachial 123                                     +---------+------------------+-----+---------+-------+ ATA      161               1.18 triphasic        +---------+------------------+-----+---------+-------+ PTA      162               1.19 triphasic        +---------+------------------+-----+---------+-------+ Great Toe144               1.06 Normal           +---------+------------------+-----+---------+-------+ +-------+-----------+-----------+------------+------------+ ABI/TBIToday's ABIToday's TBIPrevious ABIPrevious TBI +-------+-----------+-----------+------------+------------+ Right  1.05       .73        1.03        .87          +-------+-----------+-----------+------------+------------+ Left   1.19       1.06       1.18        .91          +-------+-----------+-----------+------------+------------+  Bilateral ABIs appear essentially unchanged compared to prior study on 07/05/2020. Right TBIs appear decreased compared to prior study on 07/05/2020.  Summary: Right: Resting right ankle-brachial index is within normal range. No evidence of significant right lower extremity arterial disease. The right toe-brachial index is normal. Left: Resting left ankle-brachial index is within normal range. No evidence of significant left  lower extremity arterial disease. The left toe-brachial index is normal.  *See table(s) above for measurements and observations.  Electronically signed by Leotis Pain MD on 10/10/2020 at 3:00:50 PM.    Final    VAS [place] LOWER EXTREMITY ARTERIAL DUPLEX  Result Date: 10/08/2020 LOWER EXTREMITY ARTERIAL DUPLEX STUDY  Current ABI: Rt 1.05,Lt 1.19 Comparison Study: 07/05/2020 Performing Technologist: Almira Coaster RVS  Examination Guidelines: A complete evaluation includes B-mode imaging, spectral Doppler, color Doppler, and power Doppler as needed of all accessible portions of each vessel. Bilateral testing is considered an integral part of a complete examination. Limited examinations for reoccurring indications may be performed as noted.  +-----------+--------+-----+--------+--------+--------+ RIGHT      PSV cm/sRatioStenosisWaveformComments +-----------+--------+-----+--------+--------+--------+ PTA Distal 97                                    +-----------+--------+-----+--------+--------+--------+  PERO Distal15                                    +-----------+--------+-----+--------+--------+--------+  Right Graft #1: +------------------+--------+--------+--------+------------+                   PSV cm/sStenosisWaveformComments     +------------------+--------+--------+--------+------------+ Inflow            59              biphasicCFA          +------------------+--------+--------+--------+------------+ Prox Anastomosis  68              biphasicGroin area   +------------------+--------+--------+--------+------------+ Proximal Graft    26              biphasicProx Thigh   +------------------+--------+--------+--------+------------+ Mid Graft         36              biphasicMid Thigh    +------------------+--------+--------+--------+------------+ Distal Graft      40              biphasicDistal thigh +------------------+--------+--------+--------+------------+  Distal Anastomosis36              biphasicProx calf    +------------------+--------+--------+--------+------------+ Outflow           112             biphasic             +------------------+--------+--------+--------+------------+ Right Leg BPG appears to be patent throughout.  Summary: Right: The Right Leg BPG appears to be patent throughout. Reversed flow seen in the Right ATA.  See table(s) above for measurements and observations. Electronically signed by [name] MD on 10/08/2020 at 5:14:49 PM.    Final       ASSESSMENT & PLAN:  1. Squamous cell carcinoma of right lung ([place])   2. Encounter for antineoplastic chemotherapy   3. Antineoplastic chemotherapy induced anemia   4. 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  Proceed with cycle 1 carboplatin and Taxol.  He has started concurrent radiation. - NGS showed PD-L1 TPS 95%, LRP1B S924fs, PIK3CA E545K, RB1 c1422-2A>T, RIT1 T83_ A84del, STK11 P273fs, TPS53 R248W, TMB 19.15mut/mb, MS stable.  Labs reviewed and discussed with patient.  Proceed with weekly carboplatin and Taxol today . We discussed about the future plan of CT scan after he finished the concurrent chemotherapy and radiation followed by 1 year of immunotherapy.  # Anemia, likely due to chemotherapy.  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 #Chronic anticoagulation with Eliquis and Plavix due to peripheral artery disease/embolism.  All questions were answered. The patient knows to call the clinic with any problems questions or concerns.  cc Brita Romp Dionne Bucy, MD    Return of visit: 1 week  [name], MD, PhD Hematology Oncology [facility] Pager- [phone] 10/24/2020

## 2020-10-25 ENCOUNTER — Ambulatory Visit
Admission: RE | Admit: 2020-10-25 | Discharge: 2020-10-25 | Disposition: A | Discharge status: Home / Self Care | Payer: Medicare Other | Source: Ambulatory Visit | Attending: Radiation Oncology | Admitting: Radiation Oncology

## 2020-10-25 DIAGNOSIS — Z510 Encounter for antineoplastic radiation therapy: Secondary | ICD-10-CM | POA: Diagnosis not present

## 2020-10-26 ENCOUNTER — Ambulatory Visit
Admission: RE | Admit: 2020-10-26 | Discharge: 2020-10-26 | Disposition: A | Discharge status: Home / Self Care | Payer: Medicare Other | Source: Ambulatory Visit | Attending: Radiation Oncology | Admitting: Radiation Oncology

## 2020-10-26 DIAGNOSIS — Z510 Encounter for antineoplastic radiation therapy: Secondary | ICD-10-CM | POA: Diagnosis not present

## 2020-10-29 ENCOUNTER — Ambulatory Visit
Admission: RE | Admit: 2020-10-29 | Discharge: 2020-10-29 | Disposition: A | Discharge status: Home / Self Care | Payer: Medicare Other | Source: Ambulatory Visit | Attending: Radiation Oncology | Admitting: Radiation Oncology

## 2020-10-29 ENCOUNTER — Other Ambulatory Visit: Payer: Self-pay | Admitting: Unknown Physician Specialty

## 2020-10-29 DIAGNOSIS — K118 Other diseases of salivary glands: Secondary | ICD-10-CM

## 2020-10-29 DIAGNOSIS — Z510 Encounter for antineoplastic radiation therapy: Secondary | ICD-10-CM | POA: Diagnosis not present

## 2020-10-30 ENCOUNTER — Ambulatory Visit
Admission: RE | Admit: 2020-10-30 | Discharge: 2020-10-30 | Disposition: A | Discharge status: Home / Self Care | Payer: Medicare Other | Source: Ambulatory Visit | Attending: Radiation Oncology | Admitting: Radiation Oncology

## 2020-10-30 DIAGNOSIS — Z510 Encounter for antineoplastic radiation therapy: Secondary | ICD-10-CM | POA: Diagnosis not present

## 2020-10-30 NOTE — Progress Notes (Incomplete)
Spoke with pt. Via telephone. Asked pt. To Stop Plavix 11/01/20 and to stop Eliquis 11/04/20. Pt. States "It's in My chart, so I pretty much got it. I've had so many of these tests, I think I got it down now." Asked pt. To write down dates to stop meds, and pt. Said "I got it."  Reminded pt. To report to medical mall entrance, and that he will go straight to Ultrasound for procedure. Pt. To be NPO after midnight on Tues. 4/19. Pt. Verbalized understanding of instructions. Offered [facility] phone # if pt. Had further questions re: meds. Or instructions: pt. Refused.

## 2020-10-31 ENCOUNTER — Encounter: Payer: Self-pay | Admitting: Oncology

## 2020-10-31 ENCOUNTER — Inpatient Hospital Stay: Payer: Medicare Other

## 2020-10-31 ENCOUNTER — Inpatient Hospital Stay (HOSPITAL_BASED_OUTPATIENT_CLINIC_OR_DEPARTMENT_OTHER): Payer: Medicare Other | Admitting: Oncology

## 2020-10-31 ENCOUNTER — Other Ambulatory Visit: Payer: Self-pay

## 2020-10-31 ENCOUNTER — Ambulatory Visit
Admission: RE | Admit: 2020-10-31 | Discharge: 2020-10-31 | Disposition: A | Discharge status: Home / Self Care | Payer: Medicare Other | Source: Ambulatory Visit | Attending: Radiation Oncology | Admitting: Radiation Oncology

## 2020-10-31 VITALS — BP 97/65 | HR 91 | Temp 97.7°F | Resp 18 | Wt 196.0 lb

## 2020-10-31 DIAGNOSIS — I743 Embolism and thrombosis of arteries of the lower extremities: Secondary | ICD-10-CM

## 2020-10-31 DIAGNOSIS — D6481 Anemia due to antineoplastic chemotherapy: Secondary | ICD-10-CM | POA: Diagnosis not present

## 2020-10-31 DIAGNOSIS — T451X5A Adverse effect of antineoplastic and immunosuppressive drugs, initial encounter: Secondary | ICD-10-CM

## 2020-10-31 DIAGNOSIS — D701 Agranulocytosis secondary to cancer chemotherapy: Secondary | ICD-10-CM

## 2020-10-31 DIAGNOSIS — E861 Hypovolemia: Secondary | ICD-10-CM

## 2020-10-31 DIAGNOSIS — C3491 Malignant neoplasm of unspecified part of right bronchus or lung: Secondary | ICD-10-CM | POA: Diagnosis not present

## 2020-10-31 DIAGNOSIS — Z5111 Encounter for antineoplastic chemotherapy: Secondary | ICD-10-CM | POA: Diagnosis not present

## 2020-10-31 DIAGNOSIS — T66XXXA Radiation sickness, unspecified, initial encounter: Secondary | ICD-10-CM

## 2020-10-31 DIAGNOSIS — K208 Other esophagitis without bleeding: Secondary | ICD-10-CM

## 2020-10-31 DIAGNOSIS — Z510 Encounter for antineoplastic radiation therapy: Secondary | ICD-10-CM | POA: Diagnosis not present

## 2020-10-31 DIAGNOSIS — I9589 Other hypotension: Secondary | ICD-10-CM

## 2020-10-31 LAB — COMPREHENSIVE METABOLIC PANEL
ALT: 25 U/L (ref 0–44)
AST: 24 U/L (ref 15–41)
Albumin: 4.3 g/dL (ref 3.5–5.0)
Alkaline Phosphatase: 72 U/L (ref 38–126)
Anion gap: 9 (ref 5–15)
BUN: 16 mg/dL (ref 8–23)
CO2: 26 mmol/L (ref 22–32)
Calcium: 8.8 mg/dL — ABNORMAL LOW (ref 8.9–10.3)
Chloride: 103 mmol/L (ref 98–111)
Creatinine, Ser: 1.07 mg/dL (ref 0.61–1.24)
GFR, Estimated: >60 mL/min (ref >60)
Glucose, Bld: 108 mg/dL — ABNORMAL HIGH (ref 70–99)
Potassium: 3.7 mmol/L (ref 3.5–5.1)
Sodium: 138 mmol/L (ref 135–145)
Total Bilirubin: 0.5 mg/dL (ref 0.3–1.2)
Total Protein: 7.3 g/dL (ref 6.5–8.1)

## 2020-10-31 LAB — CBC WITH DIFFERENTIAL/PLATELET
Abs Immature Granulocytes: 0.01 10*3/uL (ref 0.00–0.07)
Basophils Absolute: 0 10*3/uL (ref 0.0–0.1)
Basophils Relative: 2 %
Eosinophils Absolute: 0 10*3/uL (ref 0.0–0.5)
Eosinophils Relative: 1 %
HCT: 34.4 % — ABNORMAL LOW (ref 39.0–52.0)
Hemoglobin: 11.5 g/dL — ABNORMAL LOW (ref 13.0–17.0)
Immature Granulocytes: 0 %
Lymphocytes Relative: 19 %
Lymphs Abs: 0.5 10*3/uL — ABNORMAL LOW (ref 0.7–4.0)
MCH: 27 pg (ref 26.0–34.0)
MCHC: 33.4 g/dL (ref 30.0–36.0)
MCV: 80.8 fL (ref 80.0–100.0)
Monocytes Absolute: 0.2 10*3/uL (ref 0.1–1.0)
Monocytes Relative: 9 %
Neutro Abs: 1.6 10*3/uL — ABNORMAL LOW (ref 1.7–7.7)
Neutrophils Relative %: 69 %
Platelets: 165 10*3/uL (ref 150–400)
RBC: 4.26 MIL/uL (ref 4.22–5.81)
RDW: 15.4 % (ref 11.5–15.5)
WBC: 2.4 10*3/uL — ABNORMAL LOW (ref 4.0–10.5)
nRBC: 0 % (ref 0.0–0.2)

## 2020-10-31 MED ORDER — SODIUM CHLORIDE 0.9% FLUSH
10.0000 mL | Freq: Once | INTRAVENOUS | Status: AC
Start: 2020-10-31 — End: 2020-10-31
  Administered 2020-10-31: 10 mL via INTRAVENOUS
  Filled 2020-10-31: qty 10

## 2020-10-31 MED ORDER — HEPARIN SOD (PORK) LOCK FLUSH 100 UNIT/ML IV SOLN
500.0000 [IU] | Freq: Once | INTRAVENOUS | Status: AC | PRN
  Administered 2020-10-31: 500 [IU]
  Filled 2020-10-31: qty 5

## 2020-10-31 MED ORDER — HEPARIN SOD (PORK) LOCK FLUSH 100 UNIT/ML IV SOLN
INTRAVENOUS | Status: AC
  Filled 2020-10-31: qty 5

## 2020-10-31 MED ORDER — SODIUM CHLORIDE 0.9 % IV SOLN
240.0000 mg | Freq: Once | INTRAVENOUS | Status: AC
  Administered 2020-10-31: 240 mg via INTRAVENOUS
  Filled 2020-10-31: qty 24

## 2020-10-31 MED ORDER — DIPHENHYDRAMINE HCL 50 MG/ML IJ SOLN
25.0000 mg | Freq: Once | INTRAMUSCULAR | Status: AC
  Administered 2020-10-31: 25 mg via INTRAVENOUS
  Filled 2020-10-31: qty 1

## 2020-10-31 MED ORDER — PALONOSETRON HCL INJECTION 0.25 MG/5ML
0.2500 mg | Freq: Once | INTRAVENOUS | Status: AC
  Administered 2020-10-31: 0.25 mg via INTRAVENOUS
  Filled 2020-10-31: qty 5

## 2020-10-31 MED ORDER — SODIUM CHLORIDE 0.9 % IV SOLN
Freq: Once | INTRAVENOUS | Status: AC
Start: 2020-10-31 — End: 2020-10-31
  Filled 2020-10-31: qty 250

## 2020-10-31 MED ORDER — SODIUM CHLORIDE 0.9 % IV SOLN
45.0000 mg/m2 | Freq: Once | INTRAVENOUS | Status: AC
  Administered 2020-10-31: 96 mg via INTRAVENOUS
  Filled 2020-10-31: qty 16

## 2020-10-31 MED ORDER — SODIUM CHLORIDE 0.9 % IV SOLN
Freq: Once | INTRAVENOUS | Status: AC
  Filled 2020-10-31: qty 250

## 2020-10-31 MED ORDER — SUCRALFATE 1 G PO TABS: 1.0000 g | ORAL_TABLET | Freq: Three times a day (TID) | ORAL | 0 refills | Status: DC

## 2020-10-31 MED ORDER — FAMOTIDINE 20 MG IN NS 100 ML IVPB
20.0000 mg | Freq: Once | INTRAVENOUS | Status: AC
Start: 2020-10-31 — End: 2020-10-31
  Administered 2020-10-31: 20 mg via INTRAVENOUS
  Filled 2020-10-31: qty 20
  Filled 2020-10-31: qty 100

## 2020-10-31 MED ORDER — SODIUM CHLORIDE 0.9 % IV SOLN
20.0000 mg | Freq: Once | INTRAVENOUS | Status: AC
  Administered 2020-10-31: 20 mg via INTRAVENOUS
  Filled 2020-10-31: qty 20

## 2020-10-31 NOTE — Progress Notes (Signed)
Patient reports that his chronic neuropathy does seem to be worsening.  Has occasional trouble swallowing solids and/or liquids.  Abdominal cramping with bowel movements and he vomited a few days ago during bowel movement.

## 2020-10-31 NOTE — Progress Notes (Signed)
Hematology/Oncology Consult note [facility] Telephone:([phone] [phone]   Patient Care Team: Virginia Crews, MD as PCP - General (Family Medicine) [name], MD as Referring Physician (Physician Assistant) Bary Castilla, [name], MD (General Surgery) Telford Nab, RN as Oncology Nurse Navigator  REFERRING PROVIDER: Virginia Crews, MD  CHIEF COMPLAINTS/REASON FOR VISIT:  Follow up for lung cancer  HISTORY OF PRESENTING ILLNESS:   Joshua [name] is a  65 y.o.  male with PMH listed below was seen in consultation at the request of  Virginia Crews, MD  for evaluation of lung mass  Patient presented to emergency room on 08/25/2020 for evaluation of hemoptysis, coughing.  He coughed up small amount of blood and a large clot.  No fever, chills, chest pain, abdominal pain.  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  Patient is on Plavix and Eliquis.  Since his ER visit, he has not coughed up more blood.  08/25/2020 CT angio chest PE with and without contrast showed ill-defined soft tissue adjacent to the right upper lobe bronchus resulting in narrowing of right upper lobe bronchus.  Also right paratracheal mediastinal adenopathy.  Scattered centrilobular groundglass opacities within the right lower lobe and to a lesser extent within the right lower lobe likely reflecting an infectious or inflammatory process.  Patient lives by himself.  He has a brother who lives close by.  Patient's mother passed away recently.  57-pack-year smoking history.  He quitted smoking in 2016.  # 09/14/2020, right upper lobe lung biopsy and bronchoscopy brushing are both positive for squamous cell carcinoma.Right precarinal space lymph node insufficient lymph node sampling for staging. 09/18/2020, PET scan showed right upper lobe  nodule maximal SUV 11.5.  Hypermetabolic cluster right paratracheal adenopathy with maximum SUV up to 12.  No findings of metastatic disease to the neck/abdomen/pelvis or skeleton. Today patient presents to discuss results. No new complaints  # 10/03/2000, MRI brain with and without contrast showed no evidence of intracranial metastatic disease. 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   INTERVAL HISTORY Joshua [name] is a 65 y.o. male who has above history reviewed by me today presents for follow up visit for Stage III lung squamous cell carcinoma. Problems and complaints are listed below: Patient tolerates chemotherapy.  Blood pressure is borderline 97/65.  He took his blood pressure medication the morning. Admits not drinking too much of fluid. Mild dysphagia heartburn symptoms. He had one episode of abdominal cramping nausea during interval.  Currently no nausea vomiting diarrhea   Review of Systems  Constitutional: Positive for fatigue. Negative for appetite change, chills, fever and unexpected weight change.  HENT:   Negative for hearing loss and voice change.   Eyes: Negative for eye problems and icterus.  Respiratory: Negative for chest tightness, cough, hemoptysis and shortness of breath.   Cardiovascular: Negative for chest pain and leg swelling.  Gastrointestinal: Negative for abdominal distention and abdominal pain.  Endocrine: Negative for hot flashes.  Genitourinary: Negative for difficulty urinating, dysuria and frequency.   Musculoskeletal: Negative for arthralgias.  Skin: Negative for itching and rash.  Neurological: Negative for light-headedness and numbness.  Hematological: Negative for adenopathy. Does not bruise/bleed easily.  Psychiatric/Behavioral: Negative for confusion.    MEDICAL HISTORY:  Past Medical History:  Diagnosis Date  .  Allergy   . Dyspnea     former smoker. only doe  . History of kidney stones   . Hypertension   . Peripheral vascular disease ([place])   . Pre-diabetes   . Sleep apnea     SURGICAL HISTORY: Past Surgical History:  Procedure Laterality Date  . ANTERIOR CERVICAL DECOMP/DISCECTOMY FUSION N/A 03/14/2020   Procedure: ANTERIOR CERVICAL DECOMPRESSION/DISCECTOMY FUSION 3 LEVELS C4-7;  Surgeon: Meade Maw, MD;  Location: [facility];  Service: Neurosurgery;  Laterality: N/A;  . BACK SURGERY  2011  . CENTRAL LINE INSERTION Right 09/10/2016   Procedure: CENTRAL LINE INSERTION;  Surgeon: Katha Cabal, MD;  Location: [facility];  Service: Vascular;  Laterality: Right;  . CERVICAL FUSION     C4-c7 on Mar 14, 2020  . COLONOSCOPY  2013 ?  . CORONARY ANGIOPLASTY    . FEMORAL-POPLITEAL BYPASS GRAFT Right 09/10/2016   Procedure: BYPASS GRAFT FEMORAL-POPLITEAL ARTERY ( BELOW KNEE );  Surgeon: Katha Cabal, MD;  Location: [facility];  Service: Vascular;  Laterality: Right;  . FEMORAL-TIBIAL BYPASS GRAFT Right 03/23/2020   Procedure: BYPASS GRAFT RIGHT FEMORAL- DISTAL TIBIAL ARTERY WITH DISTAL FLOW GRAFT;  Surgeon: Katha Cabal, MD;  Location: [facility];  Service: Vascular;  Laterality: Right;  . LOWER EXTREMITY ANGIOGRAPHY Right 11/18/2016   Procedure: Lower Extremity Angiography;  Surgeon: Katha Cabal, MD;  Location: [place] [facility];  Service: Cardiovascular;  Laterality: Right;  . LOWER EXTREMITY ANGIOGRAPHY Right 12/09/2016   Procedure: Lower Extremity Angiography;  Surgeon: Katha Cabal, MD;  Location: [place] [facility];  Service: Cardiovascular;  Laterality: Right;  . LOWER EXTREMITY ANGIOGRAPHY Right 11/15/2019   Procedure: LOWER EXTREMITY ANGIOGRAPHY;  Surgeon: Katha Cabal, MD;  Location: [place] [facility];  Service: Cardiovascular;  Laterality: Right;  . LOWER EXTREMITY ANGIOGRAPHY Right 12/01/2019   Procedure: Lower Extremity Angiography;  Surgeon: Algernon Huxley, MD;  Location: [place] [facility];  Service: Cardiovascular;  Laterality: Right;  . LOWER EXTREMITY ANGIOGRAPHY Right 12/02/2019   Procedure: LOWER EXTREMITY ANGIOGRAPHY;  Surgeon: Katha Cabal, MD;  Location: [place] [facility];  Service: Cardiovascular;  Laterality: Right;  . LOWER EXTREMITY ANGIOGRAPHY Right 03/23/2020   Procedure: Lower Extremity Angiography;  Surgeon: Katha Cabal, MD;  Location: [place] [facility];  Service: Cardiovascular;  Laterality: Right;  . LOWER EXTREMITY INTERVENTION  11/18/2016   Procedure: Lower Extremity Intervention;  Surgeon: Katha Cabal, MD;  Location: [place] [facility];  Service: Cardiovascular;;  . PERIPHERAL VASCULAR CATHETERIZATION Right 01/02/2015   Procedure: Lower Extremity Angiography;  Surgeon: Katha Cabal, MD;  Location: [place] [facility];  Service: Cardiovascular;  Laterality: Right;  . PERIPHERAL VASCULAR CATHETERIZATION Right 06/18/2015   Procedure: Lower Extremity Angiography;  Surgeon: Algernon Huxley, MD;  Location: [place] [facility];  Service: Cardiovascular;  Laterality: Right;  . PERIPHERAL VASCULAR CATHETERIZATION  06/18/2015   Procedure: Lower Extremity Intervention;  Surgeon: Algernon Huxley, MD;  Location: [place] [facility];  Service: Cardiovascular;;  . PERIPHERAL VASCULAR CATHETERIZATION N/A 10/09/2015   Procedure: Abdominal Aortogram w/Lower Extremity;  Surgeon: Katha Cabal, MD;  Location: [place] [facility];  Service: Cardiovascular;  Laterality: N/A;  . PERIPHERAL VASCULAR CATHETERIZATION  10/09/2015   Procedure: Lower Extremity Intervention;  Surgeon: Katha Cabal, MD;  Location: [place] [facility];  Service: Cardiovascular;;  . PERIPHERAL VASCULAR CATHETERIZATION Right 10/10/2015   Procedure: Lower Extremity Angiography;  Surgeon: Algernon Huxley, MD;  Location: [place] [facility];  Service: Cardiovascular;  Laterality: Right;  . PERIPHERAL VASCULAR CATHETERIZATION Right 10/10/2015   Procedure: Lower Extremity  Intervention;  Surgeon: Algernon Huxley, MD;  Location: [place] [facility];  Service: Cardiovascular;  Laterality: Right;  . PERIPHERAL VASCULAR CATHETERIZATION Right 12/03/2015   Procedure: Lower Extremity Angiography;  Surgeon: Algernon Huxley, MD;  Location: [place] [facility];  Service: Cardiovascular;  Laterality: Right;  . PERIPHERAL VASCULAR CATHETERIZATION Right 12/04/2015   Procedure: Lower Extremity Angiography;  Surgeon: Algernon Huxley, MD;  Location: [place] [facility];  Service: Cardiovascular;  Laterality: Right;  . PERIPHERAL VASCULAR CATHETERIZATION  12/04/2015   Procedure: Lower Extremity Intervention;  Surgeon: Algernon Huxley, MD;  Location: [place] [facility];  Service: Cardiovascular;;  . PERIPHERAL VASCULAR CATHETERIZATION Right 06/30/2016   Procedure: Lower Extremity Angiography;  Surgeon: Algernon Huxley, MD;  Location: [place] [facility];  Service: Cardiovascular;  Laterality: Right;  . PERIPHERAL VASCULAR CATHETERIZATION Right 06/25/2016   Procedure: Lower Extremity Angiography;  Surgeon: Katha Cabal, MD;  Location: [place] [facility];  Service: Cardiovascular;  Laterality: Right;  . PERIPHERAL VASCULAR CATHETERIZATION Right 07/01/2016   Procedure: Lower Extremity Angiography;  Surgeon: Katha Cabal, MD;  Location: [place] [facility];  Service: Cardiovascular;  Laterality: Right;  . PERIPHERAL VASCULAR CATHETERIZATION  07/01/2016   Procedure: Lower Extremity Intervention;  Surgeon: Katha Cabal, MD;  Location: [place] [facility];  Service: Cardiovascular;;  . PERIPHERAL VASCULAR CATHETERIZATION Right 08/01/2016   Procedure: Lower Extremity Angiography;  Surgeon: Katha Cabal, MD;  Location: [place] [facility];  Service: Cardiovascular;  Laterality: Right;  . PORTA CATH INSERTION N/A 10/05/2020   Procedure: PORTA CATH INSERTION;  Surgeon: Katha Cabal, MD;  Location: [place] [facility];  Service: Cardiovascular;  Laterality: N/A;  . stent placement in  right leg Right   . VIDEO BRONCHOSCOPY N/A 09/14/2020   Procedure: VIDEO BRONCHOSCOPY WITH FLUORO;  Surgeon: Tyler Pita, MD;  Location: [facility];  Service: Cardiopulmonary;  Laterality: N/A;  . VIDEO BRONCHOSCOPY WITH ENDOBRONCHIAL ULTRASOUND N/A 09/14/2020   Procedure: VIDEO BRONCHOSCOPY WITH ENDOBRONCHIAL ULTRASOUND;  Surgeon: Tyler Pita, MD;  Location: [facility];  Service: Cardiopulmonary;  Laterality: N/A;    SOCIAL HISTORY: Social History   Socioeconomic History  . Marital status: Single    Spouse name: Not on file  . Number of children: Not on file  . Years of education: Not on file  . Highest education level: Not on file  Occupational History  . Occupation: unemployed  Tobacco Use  . Smoking status: Former Smoker    Packs/day: 2.00    Years: 38.00    Pack years: 76.00    Types: Cigarettes    Quit date: 12/20/2014    Years since quitting: 5.8  . Smokeless tobacco: Former Systems developer  . Tobacco comment: quit smoking in 2017 most smoked 2   Vaping Use  . Vaping Use: Never used  Substance and Sexual Activity  . Alcohol use: Not Currently  . Drug use: No  . Sexual activity: Yes  Other Topics Concern  . Not on file  Social History Narrative  . Not on file   Social Determinants of Health   Financial Resource Strain: Not on file  Food Insecurity: Not on file  Transportation Needs: Not on file  Physical Activity: Not on file  Stress: Not on file  Social Connections: Not on file  Intimate Partner Violence: Not  on file    FAMILY HISTORY: Family History  Problem Relation Age of Onset  . Diabetes Mother   . Hypertension Mother   . Heart murmur Mother   . Leukemia Mother   . Throat cancer Maternal Grandmother   . Colon cancer Neg Hx   . Breast cancer Neg Hx     ALLERGIES:  is allergic to tape.  MEDICATIONS:  Current Outpatient Medications  Medication Sig Dispense Refill  . acetaminophen (TYLENOL) 325 MG tablet Take 650 mg by mouth every 6 (six) hours as  needed for moderate pain.    . Budeson-Glycopyrrol-Formoterol (BREZTRI AEROSPHERE) 160-9-4.8 MCG/ACT AERO Inhale 160 mcg into the lungs in the morning and at bedtime. 5.9 g 0  . clopidogrel (PLAVIX) 75 MG tablet Take 1 tablet (75 mg total) by mouth daily at 6 (six) AM. 60 tablet 1  . ELIQUIS 5 MG TABS tablet TAKE 1 TABLET BY MOUTH  TWICE DAILY 180 tablet 3  . gabapentin (NEURONTIN) 300 MG capsule Take 2 capsules (600 mg total) by mouth 3 (three) times daily. (Patient taking differently: Take 900 mg by mouth at bedtime as needed (pain).) 540 capsule 1  . lidocaine-prilocaine (EMLA) cream Apply to affected area once 30 g 3  . lisinopril (ZESTRIL) 10 MG tablet TAKE 1 TABLET BY MOUTH  DAILY 90 tablet 0  . prochlorperazine (COMPAZINE) 10 MG tablet Take 1 tablet (10 mg total) by mouth every 6 (six) hours as needed (Nausea or vomiting). 30 tablet 1  . sennosides-docusate sodium (SENOKOT-S) 8.6-50 MG tablet Take 1 tablet by mouth daily as needed for constipation.    . simvastatin (ZOCOR) 40 MG tablet TAKE 1 TABLET BY MOUTH AT  BEDTIME 90 tablet 0  . sucralfate (CARAFATE) 1 g tablet Take 1 tablet (1 g total) by mouth 3 (three) times daily. 90 tablet 0  . ondansetron (ZOFRAN) 8 MG tablet Take 1 tablet (8 mg total) by mouth 2 (two) times daily as needed for refractory nausea / vomiting. Start on day 3 after chemo. (Patient not taking: No sig reported) 30 tablet 1   No current facility-administered medications for this visit.     PHYSICAL EXAMINATION: ECOG PERFORMANCE STATUS: 1 - Symptomatic but completely ambulatory Vitals:   10/31/20 0916  BP: 97/65  Pulse: 91  Resp: 18  Temp: 97.7 F (36.5 C)  SpO2: 100%   Filed Weights   10/31/20 0916  Weight: 196 lb (88.9 kg)    Physical Exam Constitutional:      General: He is not in acute distress.    Comments: Walks with a cane.   HENT:     Head: Normocephalic and atraumatic.  Eyes:     General: No scleral icterus. Cardiovascular:     Rate and  Rhythm: Normal rate and regular rhythm.     Heart sounds: Normal heart sounds.  Pulmonary:     Effort: Pulmonary effort is normal. No respiratory distress.     Breath sounds: No wheezing.  Abdominal:     General: Bowel sounds are normal. There is no distension.     Palpations: Abdomen is soft.  Musculoskeletal:        General: No deformity. Normal range of motion.     Cervical back: Normal range of motion and neck supple.  Skin:    General: Skin is warm and dry.     Findings: No erythema or rash.  Neurological:     Mental Status: He is alert and oriented to person, place, and  time. Mental status is at baseline.     Cranial Nerves: No cranial nerve deficit.     Coordination: Coordination normal.  Psychiatric:        Mood and Affect: Mood normal.     LABORATORY DATA:  I have reviewed the data as listed Lab Results  Component Value Date   WBC 2.4 (L) 10/31/2020   HGB 11.5 (L) 10/31/2020   HCT 34.4 (L) 10/31/2020   MCV 80.8 10/31/2020   PLT 165 10/31/2020   Recent Labs    03/28/20 0421 03/29/20 0444 03/30/20 0536 04/16/20 1525 08/25/20 1512 10/17/20 0828 10/24/20 0844 10/31/20 0906  NA 133* 139 140  --    < > 138 138 138  K 4.1 4.0 4.1  --    < > 4.0 4.0 3.7  CL 101 103 102  --    < > 103 105 103  CO2 $Re'28 28 28  'rKK$ --    < > $R'26 24 26  'Rr$ GLUCOSE 116* 101* 99  --    < > 137* 96 108*  BUN $Re'11 12 12  'HOA$ --    < > $R'16 21 16  'Bz$ CREATININE 0.76 0.85 0.76  --    < > 0.93 1.03 1.07  CALCIUM 8.2* 8.4* 8.6*  --    < > 8.8* 8.9 8.8*  GFRNONAA >60 >60 >60  --    < > >60 >60 >60  GFRAA >60 >60 >60  --   --   --   --   --   PROT  --   --   --  7.0   < > 7.3 7.2 7.3  ALBUMIN  --   --   --  4.3   < > 3.7 3.8 4.3  AST  --   --   --  17   < > $R'24 24 24  'Os$ ALT  --   --   --  19   < > $R'20 26 25  'PS$ ALKPHOS  --   --   --  123*   < > 79 74 72  BILITOT  --   --   --  0.4   < > 0.5 0.6 0.5  BILIDIR  --   --   --  0.10  --   --   --   --    < > = values in this interval not displayed.    Iron/TIBC/Ferritin/ %Sat    Component Value Date/Time   IRON 48 08/30/2020 1052   TIBC 391 08/30/2020 1052   FERRITIN 25 08/30/2020 1052   IRONPCTSAT 12 (L) 08/30/2020 1052      RADIOGRAPHIC STUDIES: I have personally reviewed the radiological images as listed and agreed with the findings in the report. MR Brain W Wo Contrast  Result Date: 10/03/2020 CLINICAL DATA:  Squamous cell carcinoma of right lung. EXAM: MRI HEAD WITHOUT AND WITH CONTRAST TECHNIQUE: Multiplanar, multiecho pulse sequences of the brain and surrounding structures were obtained without and with intravenous contrast. CONTRAST:  69mL GADAVIST GADOBUTROL 1 MMOL/ML IV SOLN COMPARISON:  None. FINDINGS: Brain: No acute infarction, hemorrhage, hydrocephalus, extra-axial collection or mass lesion. Scattered foci of T2 hyperintensity are seen within the white matter of the cerebral hemispheres nonspecific, most likely related to chronic microangiopathic changes. No focus of abnormal contrast enhancement. Vascular: Normal flow voids. Skull and upper cervical spine: Normal marrow signal. Sinuses/Orbits: Negative. Other: Minimal left mastoid effusion. Well-defined round lesion within the left parotid tail measuring approximately 2.3 x 1.8  x 1.8 cm without evidence of contrast enhancement (series 2, image 29; series 14, image 126; series 15, image 28). IMPRESSION: 1. No evidence of intracranial metastatic disease. 2. Scattered foci of T2 hyperintensity within the white matter of the cerebral hemispheres, nonspecific, most likely related to chronic microangiopathic changes. 3. Well-defined round lesion within the left parotid tail measuring approximately 2.3 x 1.8 x 1.8 cm without evidence of contrast enhancement. 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 Electronically Signed   By: Pedro Earls M.D.   On: 10/03/2020 17:02   PERIPHERAL VASCULAR CATHETERIZATION  Result Date: 10/05/2020 See Op  Note  VAS [place] ABI WITH/WO TBI  Result Date: 10/10/2020 LOWER EXTREMITY DOPPLER STUDY Indications: Routine f/u. Other Factors: ASO s/p multiple interventions and bypass.                11/15/2019 Right thrombectomy of Fempop insitu graft with added                stent throughout.                 12/01/2019: Aortogram and Selctive Right Lower Extremity                Angiogram. 6 mg of TPA delivered in a lysis catheterfrom the                Origin of the Right Femoral -Popliteal bypass down the proximal                ATA with placement of the lysis.                 12/02/2019: PTA of the Right Posterior Tibial Artery. PTA of the                Right Peroneal Artery. Mechanical Thrombectomy of the Right                Femoral -Popliteal Bypass graft. Mechanical Thrombectomy of the                Right Tibioperoneal Trunk and Posterior Tibial Artery.  Vascular Interventions: Hx of multiple right lower extremity PTA's/stents.                         09/10/2016 Right femoral to below the knee popliteal                         artery bypass graft placement. 5/01 Right bypass graft &                         mid popliteal artery PTA & coil embolectomy of large                         branch of bypass graft. 12/09/16 Right distal bypass                         graft PTA/stent and right posterior tibial artery PTA.                         03/23/2020 Rt PTA thrombectomy. Rt Profunda fem artery to                         PTA bypass. Comparison Study: 07/05/2020  Performing Technologist: Almira Coaster RVS  Examination Guidelines: A complete evaluation includes at minimum, Doppler waveform signals and systolic blood pressure reading at the level of bilateral brachial, anterior tibial, and posterior tibial arteries, when vessel segments are accessible. Bilateral testing is considered an integral part of a complete examination. Photoelectric Plethysmograph (PPG) waveforms and toe systolic pressure readings are included as required  and additional duplex testing as needed. Limited examinations for reoccurring indications may be performed as noted.  ABI Findings: +---------+------------------+-----+--------+--------+ Right    Rt Pressure (mmHg)IndexWaveformComment  +---------+------------------+-----+--------+--------+ Brachial 136                                     +---------+------------------+-----+--------+--------+ ATA      136               1.00 biphasic         +---------+------------------+-----+--------+--------+ PTA      143               1.05 biphasic         +---------+------------------+-----+--------+--------+ Great Toe99                0.73 Normal           +---------+------------------+-----+--------+--------+ +---------+------------------+-----+---------+-------+ Left     Lt Pressure (mmHg)IndexWaveform Comment +---------+------------------+-----+---------+-------+ Brachial 123                                     +---------+------------------+-----+---------+-------+ ATA      161               1.18 triphasic        +---------+------------------+-----+---------+-------+ PTA      162               1.19 triphasic        +---------+------------------+-----+---------+-------+ Great Toe144               1.06 Normal           +---------+------------------+-----+---------+-------+ +-------+-----------+-----------+------------+------------+ ABI/TBIToday's ABIToday's TBIPrevious ABIPrevious TBI +-------+-----------+-----------+------------+------------+ Right  1.05       .73        1.03        .87          +-------+-----------+-----------+------------+------------+ Left   1.19       1.06       1.18        .91          +-------+-----------+-----------+------------+------------+  Bilateral ABIs appear essentially unchanged compared to prior study on 07/05/2020. Right TBIs appear decreased compared to prior study on 07/05/2020.  Summary: Right: Resting right  ankle-brachial index is within normal range. No evidence of significant right lower extremity arterial disease. The right toe-brachial index is normal. Left: Resting left ankle-brachial index is within normal range. No evidence of significant left lower extremity arterial disease. The left toe-brachial index is normal.  *See table(s) above for measurements and observations.  Electronically signed by Leotis Pain MD on 10/10/2020 at 3:00:50 PM.    Final    VAS [place] LOWER EXTREMITY ARTERIAL DUPLEX  Result Date: 10/08/2020 LOWER EXTREMITY ARTERIAL DUPLEX STUDY  Current ABI: Rt 1.05,Lt 1.19 Comparison Study: 07/05/2020 Performing Technologist: Almira Coaster RVS  Examination Guidelines: A complete evaluation includes B-mode imaging, spectral Doppler, color Doppler, and power Doppler as needed of all accessible portions of each vessel.  Bilateral testing is considered an integral part of a complete examination. Limited examinations for reoccurring indications may be performed as noted.  +-----------+--------+-----+--------+--------+--------+ RIGHT      PSV cm/sRatioStenosisWaveformComments +-----------+--------+-----+--------+--------+--------+ PTA Distal 97                                    +-----------+--------+-----+--------+--------+--------+ PERO Distal15                                    +-----------+--------+-----+--------+--------+--------+  Right Graft #1: +------------------+--------+--------+--------+------------+                   PSV cm/sStenosisWaveformComments     +------------------+--------+--------+--------+------------+ Inflow            59              biphasicCFA          +------------------+--------+--------+--------+------------+ Prox Anastomosis  68              biphasicGroin area   +------------------+--------+--------+--------+------------+ Proximal Graft    26              biphasicProx Thigh    +------------------+--------+--------+--------+------------+ Mid Graft         36              biphasicMid Thigh    +------------------+--------+--------+--------+------------+ Distal Graft      40              biphasicDistal thigh +------------------+--------+--------+--------+------------+ Distal Anastomosis36              biphasicProx calf    +------------------+--------+--------+--------+------------+ Outflow           112             biphasic             +------------------+--------+--------+--------+------------+ Right Leg BPG appears to be patent throughout.  Summary: Right: The Right Leg BPG appears to be patent throughout. Reversed flow seen in the Right ATA.  See table(s) above for measurements and observations. Electronically signed by [name] MD on 10/08/2020 at 5:14:49 PM.    Final       ASSESSMENT & PLAN:  1. Encounter for antineoplastic chemotherapy   2. Squamous cell carcinoma of right lung ([place])   3. Antineoplastic chemotherapy induced anemia   4. 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 Hypotension due to hypovolemia   6. Chemotherapy-induced neutropenia (HCC)   7. Radiation esophagitis   Cancer Staging Squamous cell carcinoma of right lung ([place]) Staging form: Lung, AJCC 8th Edition - Clinical stage from 09/20/2020: Stage IIIA (cT1b, cN2, cM0) - Signed by [name], MD on 09/20/2020   #Squamous cell carcinoma of right lung, stage IIIA, Labs are reviewed and discussed with patient.  Proceed with cycle 1 carboplatin and Taxol.  He has started concurrent radiation. - NGS showed PD-L1 TPS 95%, LRP1B S970fs, PIK3CA E545K, RB1 c1422-2A>T, RIT1 T83_ A84del, STK11 P261fs, TPS53 R248W, TMB 19.24mut/mb, MS stable.  Discussed with patient.  Proceed with weekly carboplatin Taxol today. We discussed about the future plan of CT scan after he finished the concurrent chemotherapy and radiation followed by 1 year of immunotherapy. Esophagitis  due to radiation recommend Carafate for dysphagia/heartburn symptoms. #Borderline hypotension.  Advised patient to measure blood pressure at home before taking blood pressure medication. Likely due to  decreased p.o. intake.  Patient will receive 1 L of normal saline for hydration.  Encourage oral hydration #Chemotherapy-induced neutropenia, ANC 1.6.  Monitor. # Anemia, likely due to chemotherapy.  Hemoglobin is 11.5. 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 #Chronic anticoagulation with Eliquis and Plavix due to peripheral artery disease/embolism.  All questions were answered. The patient knows to call the clinic with any problems questions or concerns.  cc Brita Romp Dionne Bucy, MD    Return of visit: 1 week  [name], MD, PhD Hematology Oncology [facility] Pager- [phone] 10/31/2020

## 2020-11-01 ENCOUNTER — Ambulatory Visit
Admission: RE | Admit: 2020-11-01 | Discharge: 2020-11-01 | Disposition: A | Discharge status: Home / Self Care | Payer: Medicare Other | Source: Ambulatory Visit | Attending: Radiation Oncology | Admitting: Radiation Oncology

## 2020-11-01 DIAGNOSIS — Z510 Encounter for antineoplastic radiation therapy: Secondary | ICD-10-CM | POA: Diagnosis not present

## 2020-11-02 ENCOUNTER — Ambulatory Visit
Admission: RE | Admit: 2020-11-02 | Discharge: 2020-11-02 | Disposition: A | Discharge status: Home / Self Care | Payer: Medicare Other | Source: Ambulatory Visit | Attending: Radiation Oncology | Admitting: Radiation Oncology

## 2020-11-02 DIAGNOSIS — Z510 Encounter for antineoplastic radiation therapy: Secondary | ICD-10-CM | POA: Diagnosis not present

## 2020-11-05 ENCOUNTER — Ambulatory Visit
Admission: RE | Admit: 2020-11-05 | Discharge: 2020-11-05 | Disposition: A | Discharge status: Home / Self Care | Payer: Medicare Other | Source: Ambulatory Visit | Attending: Radiation Oncology | Admitting: Radiation Oncology

## 2020-11-05 DIAGNOSIS — Z510 Encounter for antineoplastic radiation therapy: Secondary | ICD-10-CM | POA: Diagnosis not present

## 2020-11-06 ENCOUNTER — Ambulatory Visit
Admission: RE | Admit: 2020-11-06 | Discharge: 2020-11-06 | Disposition: A | Discharge status: Home / Self Care | Payer: Medicare Other | Source: Ambulatory Visit | Attending: Radiation Oncology | Admitting: Radiation Oncology

## 2020-11-06 ENCOUNTER — Other Ambulatory Visit: Payer: Self-pay | Admitting: Radiology

## 2020-11-06 DIAGNOSIS — Z510 Encounter for antineoplastic radiation therapy: Secondary | ICD-10-CM | POA: Diagnosis not present

## 2020-11-07 ENCOUNTER — Ambulatory Visit
Admission: RE | Admit: 2020-11-07 | Discharge: 2020-11-07 | Disposition: A | Discharge status: Home / Self Care | Payer: Medicare Other | Source: Ambulatory Visit | Attending: Radiation Oncology | Admitting: Radiation Oncology

## 2020-11-07 ENCOUNTER — Inpatient Hospital Stay: Payer: Medicare Other

## 2020-11-07 ENCOUNTER — Ambulatory Visit
Admission: RE | Admit: 2020-11-07 | Discharge: 2020-11-07 | Disposition: A | Discharge status: Home / Self Care | Payer: Medicare Other | Source: Ambulatory Visit | Attending: Unknown Physician Specialty | Admitting: Unknown Physician Specialty

## 2020-11-07 ENCOUNTER — Encounter: Payer: Self-pay | Admitting: Oncology

## 2020-11-07 ENCOUNTER — Other Ambulatory Visit: Payer: Self-pay

## 2020-11-07 ENCOUNTER — Inpatient Hospital Stay (HOSPITAL_BASED_OUTPATIENT_CLINIC_OR_DEPARTMENT_OTHER): Payer: Medicare Other | Admitting: Oncology

## 2020-11-07 VITALS — BP 109/72 | HR 96 | Temp 97.4°F | Resp 18 | Wt 197.9 lb

## 2020-11-07 DIAGNOSIS — T66XXXA Radiation sickness, unspecified, initial encounter: Secondary | ICD-10-CM

## 2020-11-07 DIAGNOSIS — Z5111 Encounter for antineoplastic chemotherapy: Secondary | ICD-10-CM

## 2020-11-07 DIAGNOSIS — D701 Agranulocytosis secondary to cancer chemotherapy: Secondary | ICD-10-CM

## 2020-11-07 DIAGNOSIS — D6481 Anemia due to antineoplastic chemotherapy: Secondary | ICD-10-CM

## 2020-11-07 DIAGNOSIS — C3491 Malignant neoplasm of unspecified part of right bronchus or lung: Secondary | ICD-10-CM | POA: Diagnosis not present

## 2020-11-07 DIAGNOSIS — Z510 Encounter for antineoplastic radiation therapy: Secondary | ICD-10-CM | POA: Diagnosis not present

## 2020-11-07 DIAGNOSIS — K118 Other diseases of salivary glands: Secondary | ICD-10-CM | POA: Diagnosis present

## 2020-11-07 DIAGNOSIS — K208 Other esophagitis without bleeding: Secondary | ICD-10-CM

## 2020-11-07 DIAGNOSIS — T451X5A Adverse effect of antineoplastic and immunosuppressive drugs, initial encounter: Secondary | ICD-10-CM

## 2020-11-07 DIAGNOSIS — D696 Thrombocytopenia, unspecified: Secondary | ICD-10-CM

## 2020-11-07 DIAGNOSIS — I743 Embolism and thrombosis of arteries of the lower extremities: Secondary | ICD-10-CM

## 2020-11-07 LAB — CBC WITH DIFFERENTIAL/PLATELET
Abs Immature Granulocytes: 0.01 10*3/uL (ref 0.00–0.07)
Basophils Absolute: 0 10*3/uL (ref 0.0–0.1)
Basophils Relative: 1 %
Eosinophils Absolute: 0 10*3/uL (ref 0.0–0.5)
Eosinophils Relative: 1 %
HCT: 32.3 % — ABNORMAL LOW (ref 39.0–52.0)
Hemoglobin: 10.8 g/dL — ABNORMAL LOW (ref 13.0–17.0)
Immature Granulocytes: 1 %
Lymphocytes Relative: 23 %
Lymphs Abs: 0.5 10*3/uL — ABNORMAL LOW (ref 0.7–4.0)
MCH: 27.5 pg (ref 26.0–34.0)
MCHC: 33.4 g/dL (ref 30.0–36.0)
MCV: 82.2 fL (ref 80.0–100.0)
Monocytes Absolute: 0.1 10*3/uL (ref 0.1–1.0)
Monocytes Relative: 6 %
Neutro Abs: 1.5 10*3/uL — ABNORMAL LOW (ref 1.7–7.7)
Neutrophils Relative %: 68 %
Platelets: 63 10*3/uL — ABNORMAL LOW (ref 150–400)
RBC: 3.93 MIL/uL — ABNORMAL LOW (ref 4.22–5.81)
RDW: 15.2 % (ref 11.5–15.5)
Smear Review: NORMAL
WBC: 2.1 10*3/uL — ABNORMAL LOW (ref 4.0–10.5)
nRBC: 0 % (ref 0.0–0.2)

## 2020-11-07 LAB — COMPREHENSIVE METABOLIC PANEL
ALT: 23 U/L (ref 0–44)
AST: 20 U/L (ref 15–41)
Albumin: 3.8 g/dL (ref 3.5–5.0)
Alkaline Phosphatase: 73 U/L (ref 38–126)
Anion gap: 8 (ref 5–15)
BUN: 14 mg/dL (ref 8–23)
CO2: 26 mmol/L (ref 22–32)
Calcium: 8.9 mg/dL (ref 8.9–10.3)
Chloride: 104 mmol/L (ref 98–111)
Creatinine, Ser: 0.9 mg/dL (ref 0.61–1.24)
GFR, Estimated: >60 mL/min (ref >60)
Glucose, Bld: 105 mg/dL — ABNORMAL HIGH (ref 70–99)
Potassium: 4.2 mmol/L (ref 3.5–5.1)
Sodium: 138 mmol/L (ref 135–145)
Total Bilirubin: 0.9 mg/dL (ref 0.3–1.2)
Total Protein: 7 g/dL (ref 6.5–8.1)

## 2020-11-07 MED ORDER — SODIUM CHLORIDE 0.9% FLUSH
10.0000 mL | Freq: Once | INTRAVENOUS | Status: DC
  Filled 2020-11-07: qty 10

## 2020-11-07 MED ORDER — HEPARIN SOD (PORK) LOCK FLUSH 100 UNIT/ML IV SOLN
500.0000 [IU] | Freq: Once | INTRAVENOUS | Status: AC
  Administered 2020-11-07: 500 [IU] via INTRAVENOUS
  Filled 2020-11-07: qty 5

## 2020-11-07 NOTE — Progress Notes (Signed)
Hematology/Oncology Consult note [facility] Telephone:([phone] [phone]   Patient Care Team: Virginia Crews, MD as PCP - General (Family Medicine) [name], MD as Referring Physician (Physician Assistant) Bary Castilla, [name], MD (General Surgery) Telford Nab, RN as Oncology Nurse Navigator  REFERRING PROVIDER: Virginia Crews, MD  CHIEF COMPLAINTS/REASON FOR VISIT:  Follow up for lung cancer  HISTORY OF PRESENTING ILLNESS:   Joshua [name] is a  65 y.o.  male with PMH listed below was seen in consultation at the request of  Virginia Crews, MD  for evaluation of lung mass  Patient presented to emergency room on 08/25/2020 for evaluation of hemoptysis, coughing.  He coughed up small amount of blood and a large clot.  No fever, chills, chest pain, abdominal pain.  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  Patient is on Plavix and Eliquis.  Since his ER visit, he has not coughed up more blood.  08/25/2020 CT angio chest PE with and without contrast showed ill-defined soft tissue adjacent to the right upper lobe bronchus resulting in narrowing of right upper lobe bronchus.  Also right paratracheal mediastinal adenopathy.  Scattered centrilobular groundglass opacities within the right lower lobe and to a lesser extent within the right lower lobe likely reflecting an infectious or inflammatory process.  Patient lives by himself.  He has a brother who lives close by.  Patient's mother passed away recently.  57-pack-year smoking history.  He quitted smoking in 2016.  # 09/14/2020, right upper lobe lung biopsy and bronchoscopy brushing are both positive for squamous cell carcinoma.Right precarinal space lymph node insufficient lymph node sampling for staging. 09/18/2020, PET scan showed right upper lobe  nodule maximal SUV 11.5.  Hypermetabolic cluster right paratracheal adenopathy with maximum SUV up to 12.  No findings of metastatic disease to the neck/abdomen/pelvis or skeleton. Today patient presents to discuss results. No new complaints  # 10/03/2000, MRI brain with and without contrast showed no evidence of intracranial metastatic disease. 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   INTERVAL HISTORY Joshua [name] is a 65 y.o. male who has above history reviewed by me today presents for follow up visit for Stage III lung squamous cell carcinoma. Problems and complaints are listed below: Patient is currently off Eliquis and Plavix for biopsy by Dr. Tami Ribas for parotid tail mass.  Patient takes anticoagulation for peripheral vascular disease. Denies any active bleeding.  Overall he tolerates treatment.  No new complaints .  Denies any nausea vomiting diarrhea    Review of Systems  Constitutional: Positive for fatigue. Negative for appetite change, chills, fever and unexpected weight change.  HENT:   Negative for hearing loss and voice change.   Eyes: Negative for eye problems and icterus.  Respiratory: Negative for chest tightness, cough, hemoptysis and shortness of breath.   Cardiovascular: Negative for chest pain and leg swelling.  Gastrointestinal: Negative for abdominal distention and abdominal pain.  Endocrine: Negative for hot flashes.  Genitourinary: Negative for difficulty urinating, dysuria and frequency.   Musculoskeletal: Negative for arthralgias.  Skin: Negative for itching and rash.  Neurological: Negative for light-headedness and numbness.  Hematological: Negative for adenopathy. Does not bruise/bleed easily.  Psychiatric/Behavioral: Negative for confusion.    MEDICAL HISTORY:  Past Medical History:  Diagnosis Date  .  Allergy   . Dyspnea    former smoker. only  doe  . History of kidney stones   . Hypertension   . Peripheral vascular disease ([place])   . Pre-diabetes   . Sleep apnea     SURGICAL HISTORY: Past Surgical History:  Procedure Laterality Date  . ANTERIOR CERVICAL DECOMP/DISCECTOMY FUSION N/A 03/14/2020   Procedure: ANTERIOR CERVICAL DECOMPRESSION/DISCECTOMY FUSION 3 LEVELS C4-7;  Surgeon: Meade Maw, MD;  Location: [facility];  Service: Neurosurgery;  Laterality: N/A;  . BACK SURGERY  2011  . CENTRAL LINE INSERTION Right 09/10/2016   Procedure: CENTRAL LINE INSERTION;  Surgeon: Katha Cabal, MD;  Location: [facility];  Service: Vascular;  Laterality: Right;  . CERVICAL FUSION     C4-c7 on Mar 14, 2020  . COLONOSCOPY  2013 ?  . CORONARY ANGIOPLASTY    . FEMORAL-POPLITEAL BYPASS GRAFT Right 09/10/2016   Procedure: BYPASS GRAFT FEMORAL-POPLITEAL ARTERY ( BELOW KNEE );  Surgeon: Katha Cabal, MD;  Location: [facility];  Service: Vascular;  Laterality: Right;  . FEMORAL-TIBIAL BYPASS GRAFT Right 03/23/2020   Procedure: BYPASS GRAFT RIGHT FEMORAL- DISTAL TIBIAL ARTERY WITH DISTAL FLOW GRAFT;  Surgeon: Katha Cabal, MD;  Location: [facility];  Service: Vascular;  Laterality: Right;  . LOWER EXTREMITY ANGIOGRAPHY Right 11/18/2016   Procedure: Lower Extremity Angiography;  Surgeon: Katha Cabal, MD;  Location: [place] [facility];  Service: Cardiovascular;  Laterality: Right;  . LOWER EXTREMITY ANGIOGRAPHY Right 12/09/2016   Procedure: Lower Extremity Angiography;  Surgeon: Katha Cabal, MD;  Location: [place] [facility];  Service: Cardiovascular;  Laterality: Right;  . LOWER EXTREMITY ANGIOGRAPHY Right 11/15/2019   Procedure: LOWER EXTREMITY ANGIOGRAPHY;  Surgeon: Katha Cabal, MD;  Location: [place] [facility];  Service: Cardiovascular;  Laterality: Right;  . LOWER EXTREMITY ANGIOGRAPHY Right 12/01/2019   Procedure: Lower Extremity Angiography;  Surgeon: Algernon Huxley, MD;  Location: [place] [facility];   Service: Cardiovascular;  Laterality: Right;  . LOWER EXTREMITY ANGIOGRAPHY Right 12/02/2019   Procedure: LOWER EXTREMITY ANGIOGRAPHY;  Surgeon: Katha Cabal, MD;  Location: [place] [facility];  Service: Cardiovascular;  Laterality: Right;  . LOWER EXTREMITY ANGIOGRAPHY Right 03/23/2020   Procedure: Lower Extremity Angiography;  Surgeon: Katha Cabal, MD;  Location: [place] [facility];  Service: Cardiovascular;  Laterality: Right;  . LOWER EXTREMITY INTERVENTION  11/18/2016   Procedure: Lower Extremity Intervention;  Surgeon: Katha Cabal, MD;  Location: [place] [facility];  Service: Cardiovascular;;  . PERIPHERAL VASCULAR CATHETERIZATION Right 01/02/2015   Procedure: Lower Extremity Angiography;  Surgeon: Katha Cabal, MD;  Location: [place] [facility];  Service: Cardiovascular;  Laterality: Right;  . PERIPHERAL VASCULAR CATHETERIZATION Right 06/18/2015   Procedure: Lower Extremity Angiography;  Surgeon: Algernon Huxley, MD;  Location: [place] [facility];  Service: Cardiovascular;  Laterality: Right;  . PERIPHERAL VASCULAR CATHETERIZATION  06/18/2015   Procedure: Lower Extremity Intervention;  Surgeon: Algernon Huxley, MD;  Location: [place] [facility];  Service: Cardiovascular;;  . PERIPHERAL VASCULAR CATHETERIZATION N/A 10/09/2015   Procedure: Abdominal Aortogram w/Lower Extremity;  Surgeon: Katha Cabal, MD;  Location: [place] [facility];  Service: Cardiovascular;  Laterality: N/A;  . PERIPHERAL VASCULAR CATHETERIZATION  10/09/2015   Procedure: Lower Extremity Intervention;  Surgeon: Katha Cabal, MD;  Location: [place] [facility];  Service: Cardiovascular;;  . PERIPHERAL VASCULAR CATHETERIZATION Right 10/10/2015   Procedure: Lower Extremity Angiography;  Surgeon: Algernon Huxley, MD;  Location: [place] [facility];  Service: Cardiovascular;  Laterality: Right;  . PERIPHERAL VASCULAR CATHETERIZATION Right 10/10/2015   Procedure: Lower Extremity Intervention;   Surgeon: Algernon Huxley, MD;  Location: [place] [facility];  Service: Cardiovascular;  Laterality: Right;  . PERIPHERAL VASCULAR CATHETERIZATION Right 12/03/2015   Procedure: Lower Extremity Angiography;  Surgeon: Algernon Huxley, MD;  Location: [place] [facility];  Service: Cardiovascular;  Laterality: Right;  . PERIPHERAL VASCULAR CATHETERIZATION Right 12/04/2015   Procedure: Lower Extremity Angiography;  Surgeon: Algernon Huxley, MD;  Location: [place] [facility];  Service: Cardiovascular;  Laterality: Right;  . PERIPHERAL VASCULAR CATHETERIZATION  12/04/2015   Procedure: Lower Extremity Intervention;  Surgeon: Algernon Huxley, MD;  Location: [place] [facility];  Service: Cardiovascular;;  . PERIPHERAL VASCULAR CATHETERIZATION Right 06/30/2016   Procedure: Lower Extremity Angiography;  Surgeon: Algernon Huxley, MD;  Location: [place] [facility];  Service: Cardiovascular;  Laterality: Right;  . PERIPHERAL VASCULAR CATHETERIZATION Right 06/25/2016   Procedure: Lower Extremity Angiography;  Surgeon: Katha Cabal, MD;  Location: [place] [facility];  Service: Cardiovascular;  Laterality: Right;  . PERIPHERAL VASCULAR CATHETERIZATION Right 07/01/2016   Procedure: Lower Extremity Angiography;  Surgeon: Katha Cabal, MD;  Location: [place] [facility];  Service: Cardiovascular;  Laterality: Right;  . PERIPHERAL VASCULAR CATHETERIZATION  07/01/2016   Procedure: Lower Extremity Intervention;  Surgeon: Katha Cabal, MD;  Location: [place] [facility];  Service: Cardiovascular;;  . PERIPHERAL VASCULAR CATHETERIZATION Right 08/01/2016   Procedure: Lower Extremity Angiography;  Surgeon: Katha Cabal, MD;  Location: [place] [facility];  Service: Cardiovascular;  Laterality: Right;  . PORTA CATH INSERTION N/A 10/05/2020   Procedure: PORTA CATH INSERTION;  Surgeon: Katha Cabal, MD;  Location: [place] [facility];  Service: Cardiovascular;  Laterality: N/A;  . stent placement in right leg  Right   . VIDEO BRONCHOSCOPY N/A 09/14/2020   Procedure: VIDEO BRONCHOSCOPY WITH FLUORO;  Surgeon: Tyler Pita, MD;  Location: [facility];  Service: Cardiopulmonary;  Laterality: N/A;  . VIDEO BRONCHOSCOPY WITH ENDOBRONCHIAL ULTRASOUND N/A 09/14/2020   Procedure: VIDEO BRONCHOSCOPY WITH ENDOBRONCHIAL ULTRASOUND;  Surgeon: Tyler Pita, MD;  Location: [facility];  Service: Cardiopulmonary;  Laterality: N/A;    SOCIAL HISTORY: Social History   Socioeconomic History  . Marital status: Single    Spouse name: Not on file  . Number of children: Not on file  . Years of education: Not on file  . Highest education level: Not on file  Occupational History  . Occupation: unemployed  Tobacco Use  . Smoking status: Former Smoker    Packs/day: 2.00    Years: 38.00    Pack years: 76.00    Types: Cigarettes    Quit date: 12/20/2014    Years since quitting: 5.8  . Smokeless tobacco: Former Systems developer  . Tobacco comment: quit smoking in 2017 most smoked 2   Vaping Use  . Vaping Use: Never used  Substance and Sexual Activity  . Alcohol use: Not Currently  . Drug use: No  . Sexual activity: Yes  Other Topics Concern  . Not on file  Social History Narrative  . Not on file   Social Determinants of Health   Financial Resource Strain: Not on file  Food Insecurity: Not on file  Transportation Needs: Not on file  Physical Activity: Not on file  Stress: Not on file  Social Connections: Not on file  Intimate Partner Violence: Not  on file    FAMILY HISTORY: Family History  Problem Relation Age of Onset  . Diabetes Mother   . Hypertension Mother   . Heart murmur Mother   . Leukemia Mother   . Throat cancer Maternal Grandmother   . Colon cancer Neg Hx   . Breast cancer Neg Hx     ALLERGIES:  is allergic to tape.  MEDICATIONS:  Current Outpatient Medications  Medication Sig Dispense Refill  . acetaminophen (TYLENOL) 325 MG tablet Take 650 mg by mouth every 6 (six) hours as needed  for moderate pain.    . Budeson-Glycopyrrol-Formoterol (BREZTRI AEROSPHERE) 160-9-4.8 MCG/ACT AERO Inhale 160 mcg into the lungs in the morning and at bedtime. 5.9 g 0  . clopidogrel (PLAVIX) 75 MG tablet Take 1 tablet (75 mg total) by mouth daily at 6 (six) AM. 60 tablet 1  . ELIQUIS 5 MG TABS tablet TAKE 1 TABLET BY MOUTH  TWICE DAILY 180 tablet 3  . gabapentin (NEURONTIN) 300 MG capsule Take 2 capsules (600 mg total) by mouth 3 (three) times daily. (Patient taking differently: Take 900 mg by mouth at bedtime as needed (pain).) 540 capsule 1  . lidocaine-prilocaine (EMLA) cream Apply to affected area once 30 g 3  . lisinopril (ZESTRIL) 10 MG tablet TAKE 1 TABLET BY MOUTH  DAILY 90 tablet 0  . ondansetron (ZOFRAN) 8 MG tablet Take 1 tablet (8 mg total) by mouth 2 (two) times daily as needed for refractory nausea / vomiting. Start on day 3 after chemo. (Patient not taking: No sig reported) 30 tablet 1  . prochlorperazine (COMPAZINE) 10 MG tablet Take 1 tablet (10 mg total) by mouth every 6 (six) hours as needed (Nausea or vomiting). 30 tablet 1  . sennosides-docusate sodium (SENOKOT-S) 8.6-50 MG tablet Take 1 tablet by mouth daily as needed for constipation.    . simvastatin (ZOCOR) 40 MG tablet TAKE 1 TABLET BY MOUTH AT  BEDTIME 90 tablet 0  . sucralfate (CARAFATE) 1 g tablet Take 1 tablet (1 g total) by mouth 3 (three) times daily. 90 tablet 0   No current facility-administered medications for this visit.     PHYSICAL EXAMINATION: ECOG PERFORMANCE STATUS: 1 - Symptomatic but completely ambulatory Vitals:   11/07/20 0841  BP: 109/72  Pulse: 96  Resp: 18  Temp: (!) 97.4 F (36.3 C)  SpO2: 100%   Filed Weights   11/07/20 0841  Weight: 197 lb 14.4 oz (89.8 kg)    Physical Exam Constitutional:      General: He is not in acute distress.    Comments: Walks with a cane.   HENT:     Head: Normocephalic and atraumatic.  Eyes:     General: No scleral icterus. Cardiovascular:     Rate  and Rhythm: Normal rate and regular rhythm.     Heart sounds: Normal heart sounds.  Pulmonary:     Effort: Pulmonary effort is normal. No respiratory distress.     Breath sounds: No wheezing.  Abdominal:     General: Bowel sounds are normal. There is no distension.     Palpations: Abdomen is soft.  Musculoskeletal:        General: No deformity. Normal range of motion.     Cervical back: Normal range of motion and neck supple.  Skin:    General: Skin is warm and dry.     Findings: No erythema or rash.  Neurological:     Mental Status: He is alert and oriented to  person, place, and time. Mental status is at baseline.     Cranial Nerves: No cranial nerve deficit.     Coordination: Coordination normal.  Psychiatric:        Mood and Affect: Mood normal.     LABORATORY DATA:  I have reviewed the data as listed Lab Results  Component Value Date   WBC 2.4 (L) 10/31/2020   HGB 11.5 (L) 10/31/2020   HCT 34.4 (L) 10/31/2020   MCV 80.8 10/31/2020   PLT 165 10/31/2020   Recent Labs    03/28/20 0421 03/29/20 0444 03/30/20 0536 04/16/20 1525 08/25/20 1512 10/17/20 0828 10/24/20 0844 10/31/20 0906  NA 133* 139 140  --    < > 138 138 138  K 4.1 4.0 4.1  --    < > 4.0 4.0 3.7  CL 101 103 102  --    < > 103 105 103  CO2 $Re'28 28 28  'sby$ --    < > $R'26 24 26  'dV$ GLUCOSE 116* 101* 99  --    < > 137* 96 108*  BUN $Re'11 12 12  'BlZ$ --    < > $R'16 21 16  'tg$ CREATININE 0.76 0.85 0.76  --    < > 0.93 1.03 1.07  CALCIUM 8.2* 8.4* 8.6*  --    < > 8.8* 8.9 8.8*  GFRNONAA >60 >60 >60  --    < > >60 >60 >60  GFRAA >60 >60 >60  --   --   --   --   --   PROT  --   --   --  7.0   < > 7.3 7.2 7.3  ALBUMIN  --   --   --  4.3   < > 3.7 3.8 4.3  AST  --   --   --  17   < > $R'24 24 24  'jE$ ALT  --   --   --  19   < > $R'20 26 25  'YN$ ALKPHOS  --   --   --  123*   < > 79 74 72  BILITOT  --   --   --  0.4   < > 0.5 0.6 0.5  BILIDIR  --   --   --  0.10  --   --   --   --    < > = values in this interval not displayed.    Iron/TIBC/Ferritin/ %Sat    Component Value Date/Time   IRON 48 08/30/2020 1052   TIBC 391 08/30/2020 1052   FERRITIN 25 08/30/2020 1052   IRONPCTSAT 12 (L) 08/30/2020 1052      RADIOGRAPHIC STUDIES: I have personally reviewed the radiological images as listed and agreed with the findings in the report. VAS [place] ABI WITH/WO TBI  Result Date: 10/10/2020 LOWER EXTREMITY DOPPLER STUDY Indications: Routine f/u. Other Factors: ASO s/p multiple interventions and bypass.                11/15/2019 Right thrombectomy of Fempop insitu graft with added                stent throughout.                 12/01/2019: Aortogram and Selctive Right Lower Extremity                Angiogram. 6 mg of TPA delivered in a lysis catheterfrom the  Origin of the Right Femoral -Popliteal bypass down the proximal                ATA with placement of the lysis.                 12/02/2019: PTA of the Right Posterior Tibial Artery. PTA of the                Right Peroneal Artery. Mechanical Thrombectomy of the Right                Femoral -Popliteal Bypass graft. Mechanical Thrombectomy of the                Right Tibioperoneal Trunk and Posterior Tibial Artery.  Vascular Interventions: Hx of multiple right lower extremity PTA's/stents.                         09/10/2016 Right femoral to below the knee popliteal                         artery bypass graft placement. 5/01 Right bypass graft &                         mid popliteal artery PTA & coil embolectomy of large                         branch of bypass graft. 12/09/16 Right distal bypass                         graft PTA/stent and right posterior tibial artery PTA.                         03/23/2020 Rt PTA thrombectomy. Rt Profunda fem artery to                         PTA bypass. Comparison Study: 07/05/2020 Performing Technologist: Almira Coaster RVS  Examination Guidelines: A complete evaluation includes at minimum, Doppler waveform signals and systolic blood  pressure reading at the level of bilateral brachial, anterior tibial, and posterior tibial arteries, when vessel segments are accessible. Bilateral testing is considered an integral part of a complete examination. Photoelectric Plethysmograph (PPG) waveforms and toe systolic pressure readings are included as required and additional duplex testing as needed. Limited examinations for reoccurring indications may be performed as noted.  ABI Findings: +---------+------------------+-----+--------+--------+ Right    Rt Pressure (mmHg)IndexWaveformComment  +---------+------------------+-----+--------+--------+ Brachial 136                                     +---------+------------------+-----+--------+--------+ ATA      136               1.00 biphasic         +---------+------------------+-----+--------+--------+ PTA      143               1.05 biphasic         +---------+------------------+-----+--------+--------+ Great Toe99                0.73 Normal           +---------+------------------+-----+--------+--------+ +---------+------------------+-----+---------+-------+ Left     Lt Pressure (mmHg)IndexWaveform Comment +---------+------------------+-----+---------+-------+ Brachial 123                                     +---------+------------------+-----+---------+-------+  ATA      161               1.18 triphasic        +---------+------------------+-----+---------+-------+ PTA      162               1.19 triphasic        +---------+------------------+-----+---------+-------+ Great Toe144               1.06 Normal           +---------+------------------+-----+---------+-------+ +-------+-----------+-----------+------------+------------+ ABI/TBIToday's ABIToday's TBIPrevious ABIPrevious TBI +-------+-----------+-----------+------------+------------+ Right  1.05       .73        1.03        .87           +-------+-----------+-----------+------------+------------+ Left   1.19       1.06       1.18        .91          +-------+-----------+-----------+------------+------------+  Bilateral ABIs appear essentially unchanged compared to prior study on 07/05/2020. Right TBIs appear decreased compared to prior study on 07/05/2020.  Summary: Right: Resting right ankle-brachial index is within normal range. No evidence of significant right lower extremity arterial disease. The right toe-brachial index is normal. Left: Resting left ankle-brachial index is within normal range. No evidence of significant left lower extremity arterial disease. The left toe-brachial index is normal.  *See table(s) above for measurements and observations.  Electronically signed by Leotis Pain MD on 10/10/2020 at 3:00:50 PM.    Final    VAS [place] LOWER EXTREMITY ARTERIAL DUPLEX  Result Date: 10/08/2020 LOWER EXTREMITY ARTERIAL DUPLEX STUDY  Current ABI: Rt 1.05,Lt 1.19 Comparison Study: 07/05/2020 Performing Technologist: Almira Coaster RVS  Examination Guidelines: A complete evaluation includes B-mode imaging, spectral Doppler, color Doppler, and power Doppler as needed of all accessible portions of each vessel. Bilateral testing is considered an integral part of a complete examination. Limited examinations for reoccurring indications may be performed as noted.  +-----------+--------+-----+--------+--------+--------+ RIGHT      PSV cm/sRatioStenosisWaveformComments +-----------+--------+-----+--------+--------+--------+ PTA Distal 97                                    +-----------+--------+-----+--------+--------+--------+ PERO Distal15                                    +-----------+--------+-----+--------+--------+--------+  Right Graft #1: +------------------+--------+--------+--------+------------+                   PSV cm/sStenosisWaveformComments     +------------------+--------+--------+--------+------------+  Inflow            59              biphasicCFA          +------------------+--------+--------+--------+------------+ Prox Anastomosis  68              biphasicGroin area   +------------------+--------+--------+--------+------------+ Proximal Graft    26              biphasicProx Thigh   +------------------+--------+--------+--------+------------+ Mid Graft         36              biphasicMid Thigh    +------------------+--------+--------+--------+------------+ Distal Graft      40              biphasicDistal thigh +------------------+--------+--------+--------+------------+  Distal Anastomosis36              biphasicProx calf    +------------------+--------+--------+--------+------------+ Outflow           112             biphasic             +------------------+--------+--------+--------+------------+ Right Leg BPG appears to be patent throughout.  Summary: Right: The Right Leg BPG appears to be patent throughout. Reversed flow seen in the Right ATA.  See table(s) above for measurements and observations. Electronically signed by [name] MD on 10/08/2020 at 5:14:49 PM.    Final       ASSESSMENT & PLAN:  1. Squamous cell carcinoma of right lung ([place])   2. Encounter for antineoplastic chemotherapy   3. Antineoplastic chemotherapy induced anemia   4. Chemotherapy-induced neutropenia ([facility])   5. Radiation esophagitis   6. 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 Thrombocytopenia ([place])   Cancer Staging Squamous cell carcinoma of right lung [facility]) Staging form: Lung, AJCC 8th Edition - Clinical stage from 09/20/2020: Stage IIIA (cT1b, cN2, cM0) - Signed by [name], MD on 09/20/2020   #Squamous cell carcinoma of right lung, stage IIIA, Labs are reviewed and discussed with patient.  Proceed with cycle 1 carboplatin and Taxol.  He has started concurrent radiation. - NGS showed PD-L1 TPS 95%, LRP1B S929fs, PIK3CA E545K, RB1 c1422-2A>T,  RIT1 T83_ A84del, STK11 P240fs, TPS53 R248W, TMB 19.29mut/mb, MS stable.  Labs reviewed and discussed with patient.  Hold chemotherapy due to thrombocytopenia.  #Parotid mass, he has been off Eliquis and Plavix for biopsy today.  CBC result will be faxed to Dr. Ileene Hutchinson office. #Thrombocytopenia, I discussed with Dr. Delana Meyer and we agree upon holding Eliquis for now and resume Plavix after he finishes biopsy.  He will get weekly blood work monitored.    #Hypertension advised patient to measure blood pressure at home before taking blood pressure medication. BP has improved.  Encourage oral hydration. #Chemotherapy-induced neutropenia, ANC 1.5.  Monitor. # Anemia, likely due to chemotherapy and radiation.  Hemoglobin is 10.8,   All questions were answered. The patient knows to call the clinic with any problems questions or concerns.  cc Brita Romp Dionne Bucy, MD    Return of visit: 1 week  [name], MD, PhD Hematology Oncology [facility] Pager- [phone] 11/07/2020

## 2020-11-07 NOTE — Progress Notes (Signed)
Patient here for follow up. No new concerns voiced.  °

## 2020-11-07 NOTE — Procedures (Signed)
Interventional Radiology Procedure Note  Procedure: US Guided Biopsy of left parotid mass  Complications: None  Estimated Blood Loss: < 10 mL  Findings: 18 G core biopsy of left parotid mass performed under US guidance.  Four core samples obtained and sent to Pathology.  Venetia Night. Kathlene Cote, M.D Pager:  [phone]

## 2020-11-08 ENCOUNTER — Ambulatory Visit
Admission: RE | Admit: 2020-11-08 | Discharge: 2020-11-08 | Disposition: A | Discharge status: Home / Self Care | Payer: Medicare Other | Source: Ambulatory Visit | Attending: Radiation Oncology | Admitting: Radiation Oncology

## 2020-11-08 DIAGNOSIS — Z510 Encounter for antineoplastic radiation therapy: Secondary | ICD-10-CM | POA: Diagnosis not present

## 2020-11-08 LAB — SURGICAL PATHOLOGY

## 2020-11-09 ENCOUNTER — Ambulatory Visit
Admission: RE | Admit: 2020-11-09 | Discharge: 2020-11-09 | Disposition: A | Discharge status: Home / Self Care | Payer: Medicare Other | Source: Ambulatory Visit | Attending: Radiation Oncology | Admitting: Radiation Oncology

## 2020-11-09 DIAGNOSIS — Z510 Encounter for antineoplastic radiation therapy: Secondary | ICD-10-CM | POA: Diagnosis not present

## 2020-11-12 ENCOUNTER — Ambulatory Visit
Admission: RE | Admit: 2020-11-12 | Discharge: 2020-11-12 | Disposition: A | Discharge status: Home / Self Care | Payer: Medicare Other | Source: Ambulatory Visit | Attending: Radiation Oncology | Admitting: Radiation Oncology

## 2020-11-12 DIAGNOSIS — Z510 Encounter for antineoplastic radiation therapy: Secondary | ICD-10-CM | POA: Diagnosis not present

## 2020-11-13 ENCOUNTER — Ambulatory Visit
Admission: RE | Admit: 2020-11-13 | Discharge: 2020-11-13 | Disposition: A | Discharge status: Home / Self Care | Payer: Medicare Other | Source: Ambulatory Visit | Attending: Radiation Oncology | Admitting: Radiation Oncology

## 2020-11-13 DIAGNOSIS — Z510 Encounter for antineoplastic radiation therapy: Secondary | ICD-10-CM | POA: Diagnosis not present

## 2020-11-14 ENCOUNTER — Inpatient Hospital Stay: Payer: Medicare Other

## 2020-11-14 ENCOUNTER — Ambulatory Visit
Admission: RE | Admit: 2020-11-14 | Discharge: 2020-11-14 | Disposition: A | Discharge status: Home / Self Care | Payer: Medicare Other | Source: Ambulatory Visit | Attending: Radiation Oncology | Admitting: Radiation Oncology

## 2020-11-14 ENCOUNTER — Encounter: Payer: Self-pay | Admitting: Oncology

## 2020-11-14 ENCOUNTER — Other Ambulatory Visit: Payer: Self-pay

## 2020-11-14 ENCOUNTER — Inpatient Hospital Stay (HOSPITAL_BASED_OUTPATIENT_CLINIC_OR_DEPARTMENT_OTHER): Payer: Medicare Other | Admitting: Oncology

## 2020-11-14 VITALS — BP 111/74 | HR 93 | Temp 97.0°F | Resp 18 | Wt 200.9 lb

## 2020-11-14 DIAGNOSIS — D6481 Anemia due to antineoplastic chemotherapy: Secondary | ICD-10-CM | POA: Diagnosis not present

## 2020-11-14 DIAGNOSIS — T451X5A Adverse effect of antineoplastic and immunosuppressive drugs, initial encounter: Secondary | ICD-10-CM

## 2020-11-14 DIAGNOSIS — T66XXXA Radiation sickness, unspecified, initial encounter: Secondary | ICD-10-CM

## 2020-11-14 DIAGNOSIS — Z510 Encounter for antineoplastic radiation therapy: Secondary | ICD-10-CM | POA: Diagnosis not present

## 2020-11-14 DIAGNOSIS — C3491 Malignant neoplasm of unspecified part of right bronchus or lung: Secondary | ICD-10-CM | POA: Diagnosis not present

## 2020-11-14 DIAGNOSIS — K208 Other esophagitis without bleeding: Secondary | ICD-10-CM

## 2020-11-14 DIAGNOSIS — D701 Agranulocytosis secondary to cancer chemotherapy: Secondary | ICD-10-CM | POA: Diagnosis not present

## 2020-11-14 DIAGNOSIS — Z5111 Encounter for antineoplastic chemotherapy: Secondary | ICD-10-CM | POA: Diagnosis not present

## 2020-11-14 DIAGNOSIS — Z95828 Presence of other vascular implants and grafts: Secondary | ICD-10-CM

## 2020-11-14 DIAGNOSIS — I743 Embolism and thrombosis of arteries of the lower extremities: Secondary | ICD-10-CM

## 2020-11-14 LAB — CBC WITH DIFFERENTIAL/PLATELET
Abs Immature Granulocytes: 0 10*3/uL (ref 0.00–0.07)
Basophils Absolute: 0 10*3/uL (ref 0.0–0.1)
Basophils Relative: 1 %
Eosinophils Absolute: 0 10*3/uL (ref 0.0–0.5)
Eosinophils Relative: 3 %
HCT: 31.2 % — ABNORMAL LOW (ref 39.0–52.0)
Hemoglobin: 10.4 g/dL — ABNORMAL LOW (ref 13.0–17.0)
Immature Granulocytes: 0 %
Lymphocytes Relative: 40 %
Lymphs Abs: 0.4 10*3/uL — ABNORMAL LOW (ref 0.7–4.0)
MCH: 27.7 pg (ref 26.0–34.0)
MCHC: 33.3 g/dL (ref 30.0–36.0)
MCV: 83.2 fL (ref 80.0–100.0)
Monocytes Absolute: 0.1 10*3/uL (ref 0.1–1.0)
Monocytes Relative: 13 %
Neutro Abs: 0.5 10*3/uL — ABNORMAL LOW (ref 1.7–7.7)
Neutrophils Relative %: 43 %
Platelets: 86 10*3/uL — ABNORMAL LOW (ref 150–400)
RBC: 3.75 MIL/uL — ABNORMAL LOW (ref 4.22–5.81)
RDW: 15.9 % — ABNORMAL HIGH (ref 11.5–15.5)
Smear Review: NORMAL
WBC: 1.1 10*3/uL — CL (ref 4.0–10.5)
nRBC: 0 % (ref 0.0–0.2)

## 2020-11-14 LAB — COMPREHENSIVE METABOLIC PANEL
ALT: 23 U/L (ref 0–44)
AST: 25 U/L (ref 15–41)
Albumin: 3.5 g/dL (ref 3.5–5.0)
Alkaline Phosphatase: 69 U/L (ref 38–126)
Anion gap: 11 (ref 5–15)
BUN: 19 mg/dL (ref 8–23)
CO2: 23 mmol/L (ref 22–32)
Calcium: 8.8 mg/dL — ABNORMAL LOW (ref 8.9–10.3)
Chloride: 104 mmol/L (ref 98–111)
Creatinine, Ser: 1 mg/dL (ref 0.61–1.24)
GFR, Estimated: >60 mL/min (ref >60)
Glucose, Bld: 132 mg/dL — ABNORMAL HIGH (ref 70–99)
Potassium: 3.8 mmol/L (ref 3.5–5.1)
Sodium: 138 mmol/L (ref 135–145)
Total Bilirubin: 0.5 mg/dL (ref 0.3–1.2)
Total Protein: 6.7 g/dL (ref 6.5–8.1)

## 2020-11-14 MED ORDER — SODIUM CHLORIDE 0.9% FLUSH
10.0000 mL | Freq: Once | INTRAVENOUS | Status: AC
Start: 2020-11-14 — End: 2020-11-14
  Administered 2020-11-14: 10 mL via INTRAVENOUS
  Filled 2020-11-14: qty 10

## 2020-11-14 MED ORDER — HEPARIN SOD (PORK) LOCK FLUSH 100 UNIT/ML IV SOLN
500.0000 [IU] | Freq: Once | INTRAVENOUS | Status: AC
  Administered 2020-11-14: 500 [IU] via INTRAVENOUS
  Filled 2020-11-14: qty 5

## 2020-11-14 MED ORDER — CHLORHEXIDINE GLUCONATE 0.12 % MT SOLN: 15.0000 mL | Freq: Two times a day (BID) | OROMUCOSAL | 1 refills | Status: DC

## 2020-11-14 NOTE — Progress Notes (Signed)
Patient denies new problems/concerns today.   °

## 2020-11-14 NOTE — Progress Notes (Signed)
Hematology/Oncology Consult note [facility] [phone] [phone]   Patient Care Team: Virginia Crews, MD as PCP - General (Family Medicine) [name], MD as Referring Physician (Physician Assistant) Bary Castilla, [name], MD (General Surgery) Telford Nab, RN as Oncology Nurse Navigator  REFERRING PROVIDER: Virginia Crews, MD  CHIEF COMPLAINTS/REASON FOR VISIT:  Follow up for lung cancer  HISTORY OF PRESENTING ILLNESS:   Joshua [name] is a  65 y.o.  male with PMH listed below was seen in consultation at the request of  Virginia Crews, MD  for evaluation of lung mass  Patient presented to emergency room on 08/25/2020 for evaluation of hemoptysis, coughing.  He coughed up small amount of blood and a large clot.  No fever, chills, chest pain, abdominal pain.  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  Patient is on Plavix and Eliquis.  Since his ER visit, he has not coughed up more blood.  08/25/2020 CT angio chest PE with and without contrast showed ill-defined soft tissue adjacent to the right upper lobe bronchus resulting in narrowing of right upper lobe bronchus.  Also right paratracheal mediastinal adenopathy.  Scattered centrilobular groundglass opacities within the right lower lobe and to a lesser extent within the right lower lobe likely reflecting an infectious or inflammatory process.  Patient lives by himself.  He has a brother who lives close by.  Patient's mother passed away recently.  57-pack-year smoking history.  He quitted smoking in 2016.  # 09/14/2020, right upper lobe lung biopsy and bronchoscopy brushing are both positive for squamous cell carcinoma.Right precarinal space lymph node insufficient lymph node sampling for staging. 09/18/2020, PET scan showed right upper lobe  nodule maximal SUV 11.5.  Hypermetabolic cluster right paratracheal adenopathy with maximum SUV up to 12.  No findings of metastatic disease to the neck/abdomen/pelvis or skeleton. Today patient presents to discuss results. No new complaints  # 10/03/2000, MRI brain with and without contrast showed no evidence of intracranial metastatic disease. 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   INTERVAL HISTORY Joshua [name] is a 65 y.o. male who has above history reviewed by me today presents for follow up visit for Stage III lung squamous cell carcinoma. Problems and complaints are listed below: Eliquis is held due to thrombocytopenia..  Patient is on Plavix for peripheral vascular disease..  Patient denies any fever, chills, nausea vomiting.  No significant dysphagia more than his baseline level. No bleeding events.   Review of Systems  Constitutional: Positive for fatigue. Negative for appetite change, chills, fever and unexpected weight change.  HENT:   Negative for hearing loss and voice change.   Eyes: Negative for eye problems and icterus.  Respiratory: Negative for chest tightness, cough, hemoptysis and shortness of breath.   Cardiovascular: Negative for chest pain and leg swelling.  Gastrointestinal: Negative for abdominal distention and abdominal pain.  Endocrine: Negative for hot flashes.  Genitourinary: Negative for difficulty urinating, dysuria and frequency.   Musculoskeletal: Negative for arthralgias.  Skin: Negative for itching and rash.  Neurological: Negative for light-headedness and numbness.  Hematological: Negative for adenopathy. Does not bruise/bleed easily.  Psychiatric/Behavioral: Negative for confusion.    MEDICAL HISTORY:  Past Medical History:  Diagnosis Date  . Allergy   . Dyspnea    former  smoker. only doe  . History of kidney stones   . Hypertension   .  Peripheral vascular disease ([place])   . Pre-diabetes   . Sleep apnea     SURGICAL HISTORY: Past Surgical History:  Procedure Laterality Date  . ANTERIOR CERVICAL DECOMP/DISCECTOMY FUSION N/A 03/14/2020   Procedure: ANTERIOR CERVICAL DECOMPRESSION/DISCECTOMY FUSION 3 LEVELS C4-7;  Surgeon: Meade Maw, MD;  Location: [facility];  Service: Neurosurgery;  Laterality: N/A;  . BACK SURGERY  2011  . CENTRAL LINE INSERTION Right 09/10/2016   Procedure: CENTRAL LINE INSERTION;  Surgeon: Katha Cabal, MD;  Location: [facility];  Service: Vascular;  Laterality: Right;  . CERVICAL FUSION     C4-c7 on Mar 14, 2020  . COLONOSCOPY  2013 ?  . CORONARY ANGIOPLASTY    . FEMORAL-POPLITEAL BYPASS GRAFT Right 09/10/2016   Procedure: BYPASS GRAFT FEMORAL-POPLITEAL ARTERY ( BELOW KNEE );  Surgeon: Katha Cabal, MD;  Location: [facility];  Service: Vascular;  Laterality: Right;  . FEMORAL-TIBIAL BYPASS GRAFT Right 03/23/2020   Procedure: BYPASS GRAFT RIGHT FEMORAL- DISTAL TIBIAL ARTERY WITH DISTAL FLOW GRAFT;  Surgeon: Katha Cabal, MD;  Location: [facility];  Service: Vascular;  Laterality: Right;  . LOWER EXTREMITY ANGIOGRAPHY Right 11/18/2016   Procedure: Lower Extremity Angiography;  Surgeon: Katha Cabal, MD;  Location: [place] [facility];  Service: Cardiovascular;  Laterality: Right;  . LOWER EXTREMITY ANGIOGRAPHY Right 12/09/2016   Procedure: Lower Extremity Angiography;  Surgeon: Katha Cabal, MD;  Location: [place] [facility];  Service: Cardiovascular;  Laterality: Right;  . LOWER EXTREMITY ANGIOGRAPHY Right 11/15/2019   Procedure: LOWER EXTREMITY ANGIOGRAPHY;  Surgeon: Katha Cabal, MD;  Location: [place] [facility];  Service: Cardiovascular;  Laterality: Right;  . LOWER EXTREMITY ANGIOGRAPHY Right 12/01/2019   Procedure: Lower Extremity Angiography;  Surgeon: Algernon Huxley, MD;  Location: [place] [facility];  Service: Cardiovascular;  Laterality: Right;  . LOWER  EXTREMITY ANGIOGRAPHY Right 12/02/2019   Procedure: LOWER EXTREMITY ANGIOGRAPHY;  Surgeon: Katha Cabal, MD;  Location: [place] [facility];  Service: Cardiovascular;  Laterality: Right;  . LOWER EXTREMITY ANGIOGRAPHY Right 03/23/2020   Procedure: Lower Extremity Angiography;  Surgeon: Katha Cabal, MD;  Location: [place] [facility];  Service: Cardiovascular;  Laterality: Right;  . LOWER EXTREMITY INTERVENTION  11/18/2016   Procedure: Lower Extremity Intervention;  Surgeon: Katha Cabal, MD;  Location: [place] [facility];  Service: Cardiovascular;;  . PERIPHERAL VASCULAR CATHETERIZATION Right 01/02/2015   Procedure: Lower Extremity Angiography;  Surgeon: Katha Cabal, MD;  Location: [place] [facility];  Service: Cardiovascular;  Laterality: Right;  . PERIPHERAL VASCULAR CATHETERIZATION Right 06/18/2015   Procedure: Lower Extremity Angiography;  Surgeon: Algernon Huxley, MD;  Location: [place] [facility];  Service: Cardiovascular;  Laterality: Right;  . PERIPHERAL VASCULAR CATHETERIZATION  06/18/2015   Procedure: Lower Extremity Intervention;  Surgeon: Algernon Huxley, MD;  Location: [place] [facility];  Service: Cardiovascular;;  . PERIPHERAL VASCULAR CATHETERIZATION N/A 10/09/2015   Procedure: Abdominal Aortogram w/Lower Extremity;  Surgeon: Katha Cabal, MD;  Location: [place] [facility];  Service: Cardiovascular;  Laterality: N/A;  . PERIPHERAL VASCULAR CATHETERIZATION  10/09/2015   Procedure: Lower Extremity Intervention;  Surgeon: Katha Cabal, MD;  Location: [place] [facility];  Service: Cardiovascular;;  . PERIPHERAL VASCULAR CATHETERIZATION Right 10/10/2015   Procedure: Lower Extremity Angiography;  Surgeon: Algernon Huxley, MD;  Location: [place] [facility];  Service: Cardiovascular;  Laterality: Right;  . PERIPHERAL VASCULAR CATHETERIZATION Right 10/10/2015   Procedure: Lower Extremity Intervention;  Surgeon: Algernon Huxley, MD;  Location: [place] [facility];   Service: Cardiovascular;  Laterality: Right;  . PERIPHERAL VASCULAR CATHETERIZATION Right 12/03/2015   Procedure: Lower Extremity Angiography;  Surgeon: Algernon Huxley, MD;  Location: [place] [facility];  Service: Cardiovascular;  Laterality: Right;  . PERIPHERAL VASCULAR CATHETERIZATION Right 12/04/2015   Procedure: Lower Extremity Angiography;  Surgeon: Algernon Huxley, MD;  Location: [place] [facility];  Service: Cardiovascular;  Laterality: Right;  . PERIPHERAL VASCULAR CATHETERIZATION  12/04/2015   Procedure: Lower Extremity Intervention;  Surgeon: Algernon Huxley, MD;  Location: [place] [facility];  Service: Cardiovascular;;  . PERIPHERAL VASCULAR CATHETERIZATION Right 06/30/2016   Procedure: Lower Extremity Angiography;  Surgeon: Algernon Huxley, MD;  Location: [place] [facility];  Service: Cardiovascular;  Laterality: Right;  . PERIPHERAL VASCULAR CATHETERIZATION Right 06/25/2016   Procedure: Lower Extremity Angiography;  Surgeon: Katha Cabal, MD;  Location: [place] [facility];  Service: Cardiovascular;  Laterality: Right;  . PERIPHERAL VASCULAR CATHETERIZATION Right 07/01/2016   Procedure: Lower Extremity Angiography;  Surgeon: Katha Cabal, MD;  Location: [place] [facility];  Service: Cardiovascular;  Laterality: Right;  . PERIPHERAL VASCULAR CATHETERIZATION  07/01/2016   Procedure: Lower Extremity Intervention;  Surgeon: Katha Cabal, MD;  Location: [place] [facility];  Service: Cardiovascular;;  . PERIPHERAL VASCULAR CATHETERIZATION Right 08/01/2016   Procedure: Lower Extremity Angiography;  Surgeon: Katha Cabal, MD;  Location: [place] [facility];  Service: Cardiovascular;  Laterality: Right;  . PORTA CATH INSERTION N/A 10/05/2020   Procedure: PORTA CATH INSERTION;  Surgeon: Katha Cabal, MD;  Location: [place] [facility];  Service: Cardiovascular;  Laterality: N/A;  . stent placement in right leg Right   . VIDEO BRONCHOSCOPY N/A 09/14/2020   Procedure: VIDEO  BRONCHOSCOPY WITH FLUORO;  Surgeon: Tyler Pita, MD;  Location: [facility];  Service: Cardiopulmonary;  Laterality: N/A;  . VIDEO BRONCHOSCOPY WITH ENDOBRONCHIAL ULTRASOUND N/A 09/14/2020   Procedure: VIDEO BRONCHOSCOPY WITH ENDOBRONCHIAL ULTRASOUND;  Surgeon: Tyler Pita, MD;  Location: [facility];  Service: Cardiopulmonary;  Laterality: N/A;    SOCIAL HISTORY: Social History   Socioeconomic History  . Marital status: Single    Spouse name: Not on file  . Number of children: Not on file  . Years of education: Not on file  . Highest education level: Not on file  Occupational History  . Occupation: unemployed  Tobacco Use  . Smoking status: Former Smoker    Packs/day: 2.00    Years: 38.00    Pack years: 76.00    Types: Cigarettes    Quit date: 12/20/2014    Years since quitting: 5.9  . Smokeless tobacco: Former Systems developer  . Tobacco comment: quit smoking in 2017 most smoked 2   Vaping Use  . Vaping Use: Never used  Substance and Sexual Activity  . Alcohol use: Not Currently  . Drug use: No  . Sexual activity: Yes  Other Topics Concern  . Not on file  Social History Narrative  . Not on file   Social Determinants of Health   Financial Resource Strain: Not on file  Food Insecurity: Not on file  Transportation Needs: Not on file  Physical Activity: Not on file  Stress: Not on file  Social Connections: Not on file  Intimate Partner Violence: Not on file    FAMILY HISTORY: Family History  Problem Relation Age of Onset  . Diabetes Mother   . Hypertension Mother   . Heart murmur Mother   . Leukemia Mother   . Throat cancer Maternal Grandmother   . Colon cancer Neg Hx   . Breast cancer Neg Hx     ALLERGIES:  is allergic to tape.  MEDICATIONS:  Current Outpatient Medications  Medication Sig Dispense Refill  . acetaminophen (TYLENOL) 325 MG tablet Take 650 mg by mouth every 6 (six) hours as needed for moderate pain.    . Budeson-Glycopyrrol-Formoterol (BREZTRI  AEROSPHERE) 160-9-4.8 MCG/ACT AERO Inhale 160 mcg into the lungs in the morning and at bedtime. 5.9 g 0  . chlorhexidine (PERIDEX) 0.12 % solution Use as directed 15 mLs in the mouth or throat 2 (two) times daily. 473 mL 1  . clopidogrel (PLAVIX) 75 MG tablet Take 1 tablet (75 mg total) by mouth daily at 6 (six) AM. 60 tablet 1  . ELIQUIS 5 MG TABS tablet TAKE 1 TABLET BY MOUTH  TWICE DAILY 180 tablet 3  . gabapentin (NEURONTIN) 300 MG capsule Take 2 capsules (600 mg total) by mouth 3 (three) times daily. (Patient taking differently: Take 900 mg by mouth at bedtime as needed (pain).) 540 capsule 1  . lidocaine-prilocaine (EMLA) cream Apply to affected area once 30 g 3  . lisinopril (ZESTRIL) 10 MG tablet TAKE 1 TABLET BY MOUTH  DAILY 90 tablet 0  . sennosides-docusate sodium (SENOKOT-S) 8.6-50 MG tablet Take 1 tablet by mouth daily as needed for constipation.    . simvastatin (ZOCOR) 40 MG tablet TAKE 1 TABLET BY MOUTH AT  BEDTIME 90 tablet 0  . sucralfate (CARAFATE) 1 g tablet Take 1 tablet (1 g total) by mouth 3 (three) times daily. 90 tablet 0  . ondansetron (ZOFRAN) 8 MG tablet Take 1 tablet (8 mg total) by mouth 2 (two) times daily as needed for refractory nausea / vomiting. Start on day 3 after chemo. (Patient not taking: No sig reported) 30 tablet 1  . prochlorperazine (COMPAZINE) 10 MG tablet Take 1 tablet (10 mg total) by mouth every 6 (six) hours as needed (Nausea or vomiting). (Patient not taking: No sig reported) 30 tablet 1   No current facility-administered medications for this visit.     PHYSICAL EXAMINATION: ECOG PERFORMANCE STATUS: 1 - Symptomatic but completely ambulatory Vitals:   11/14/20 0830  BP: 111/74  Pulse: 93  Resp: 18  Temp: (!) 97 F (36.1 C)   Filed Weights   11/14/20 0830  Weight: 200 lb 14.4 oz (91.1 kg)    Physical Exam Constitutional:      General: He is not in acute distress.    Comments: Walks with a cane.   HENT:     Head: Normocephalic and  atraumatic.  Eyes:     General: No scleral icterus. Cardiovascular:     Rate and Rhythm: Normal rate and regular rhythm.     Heart sounds: Normal heart sounds.  Pulmonary:     Effort: Pulmonary effort is normal. No respiratory distress.     Breath sounds: No wheezing.  Abdominal:     General: Bowel sounds are normal. There is no distension.     Palpations: Abdomen is soft.  Musculoskeletal:        General: No deformity. Normal range of motion.     Cervical back: Normal range of motion and neck supple.  Skin:    General: Skin is warm and dry.     Findings: No  erythema or rash.  Neurological:     Mental Status: He is alert and oriented to person, place, and time. Mental status is at baseline.     Cranial Nerves: No cranial nerve deficit.     Coordination: Coordination normal.  Psychiatric:        Mood and Affect: Mood normal.     LABORATORY DATA:  I have reviewed the data as listed Lab Results  Component Value Date   WBC 1.1 (LL) 11/14/2020   HGB 10.4 (L) 11/14/2020   HCT 31.2 (L) 11/14/2020   MCV 83.2 11/14/2020   PLT 86 (L) 11/14/2020   Recent Labs    03/28/20 0421 03/29/20 0444 03/30/20 0536 04/16/20 1525 08/25/20 1512 10/31/20 0906 11/07/20 0808 11/14/20 0805  NA 133* 139 140  --    < > 138 138 138  K 4.1 4.0 4.1  --    < > 3.7 4.2 3.8  CL 101 103 102  --    < > 103 104 104  CO2 _0 --    < > _1 GLUCOSE 116* 101* 99  --    < > 108* 105* 132*  BUN _2 --    < > _3 CREATININE 0.76 0.85 0.76  --    < > 1.07 0.90 1.00  CALCIUM 8.2* 8.4* 8.6*  --    < > 8.8* 8.9 8.8*  GFRNONAA >60 >60 >60  --    < > >60 >60 >60  GFRAA >60 >60 >60  --   --   --   --   --   PROT  --   --   --  7.0   < > 7.3 7.0 6.7  ALBUMIN  --   --   --  4.3   < > 4.3 3.8 3.5  AST  --   --   --  17   < > _4 ALT  --   --   --  19   < > _5 ALKPHOS  --   --   --  123*   < > 72 73 69  BILITOT  --   --   --  0.4   < > 0.5 0.9 0.5  BILIDIR  --   --   --   0.10  --   --   --   --    < > = values in this interval not displayed.   Iron/TIBC/Ferritin/ %Sat    Component Value Date/Time   IRON 48 08/30/2020 1052   TIBC 391 08/30/2020 1052   FERRITIN 25 08/30/2020 1052   IRONPCTSAT 12 (L) 08/30/2020 1052      RADIOGRAPHIC STUDIES: I have personally reviewed the radiological images as listed and agreed with the findings in the report. [place] CORE BIOPSY (SALIVARY GLAND/PAROTID GLAND)  Result Date: 11/07/2020 CLINICAL DATA:  Left parotid gland mass. EXAM: ULTRASOUND GUIDED CORE BIOPSY OF LEFT PAROTID MASS MEDICATIONS: None PROCEDURE: The procedure, risks, benefits, and alternatives were explained to the patient. Questions regarding the procedure were encouraged and answered. The patient understands and consents to the procedure. A time out was performed prior to initiating the procedure. Ultrasound was performed to localize a left parotid mass. The left face was prepped with chlorhexidine in a sterile fashion, and a sterile drape was applied covering the operative field. A sterile gown and sterile gloves were used for the procedure.  Local anesthesia was provided with 1% Lidocaine. Under ultrasound guidance, an 18 gauge core biopsy device was utilized in obtaining 4 separate core biopsy samples of a left parotid mass. Core biopsy samples were submitted in formalin. COMPLICATIONS: None. FINDINGS: Smoothly marginated, oval shaped lesion of the tail of the left parotid gland measures approximately 2.5 x 1.4 x 1.7 cm by ultrasound. Solid tissue was obtained with core biopsy. IMPRESSION: Ultrasound-guided core biopsy performed of a left parotid gland mass measuring up to 2.5 cm in maximum diameter by ultrasound. Electronically Signed   By: Aletta Edouard M.D.   On: 11/07/2020 15:22      ASSESSMENT & PLAN:  1. Squamous cell carcinoma of right lung ([place])   2. Encounter for antineoplastic chemotherapy   3. Antineoplastic chemotherapy induced anemia   4.  Chemotherapy-induced neutropenia (HCC)   5. Radiation esophagitis   6. 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  Proceed with cycle 1 carboplatin and Taxol.  He has started concurrent radiation. - NGS showed PD-L1 TPS 95%, LRP1B S970f, PIK3CA E545K, RB1 c1422-2A>T, RIT1 T83_ A84del, STK11 P2826f TPS53 R248W, TMB 19.29m87mmb, MS stable.  Lab was reviewed and discussed with patient.  Hold chemotherapy due to neutropenia and thrombocytopenia.  #Parotid mass, status post biopsy.  Management per Dr. McQTami Ribashrombocytopenia, I discussed with Dr. SchDelana MeyerHold Eliquis and continue Plavix at this point.  We will resume him on Eliquis once his counts recovers.  #Chemotherapy/radiation induced neutropenia, hold chemo.  Defer to Dr. ChrBaruch Goutyr decision of continuation of radiation. ANC is 0.5.  Recommend patient to start using Peridex mouth rinse twice daily #Anemia, likely due to chemotherapy and radiation.  Hemoglobin is 10.4.   All questions were answered. The patient knows to call the clinic with any problems questions or concerns.  cc BacBrita RompgDionne BucyD    Return of visit: 1 week  ZhoEarlie ServerD, PhD Hematology Oncology [facility]- [id number]

## 2020-11-15 ENCOUNTER — Ambulatory Visit
Admission: RE | Admit: 2020-11-15 | Discharge: 2020-11-15 | Disposition: A | Discharge status: Home / Self Care | Payer: Medicare Other | Source: Ambulatory Visit | Attending: Radiation Oncology | Admitting: Radiation Oncology

## 2020-11-15 ENCOUNTER — Encounter: Payer: Medicare Other | Admitting: Family Medicine

## 2020-11-15 DIAGNOSIS — Z510 Encounter for antineoplastic radiation therapy: Secondary | ICD-10-CM | POA: Diagnosis not present

## 2020-11-16 ENCOUNTER — Ambulatory Visit
Admission: RE | Admit: 2020-11-16 | Discharge: 2020-11-16 | Disposition: A | Discharge status: Home / Self Care | Payer: Medicare Other | Source: Ambulatory Visit | Attending: Radiation Oncology | Admitting: Radiation Oncology

## 2020-11-16 DIAGNOSIS — Z510 Encounter for antineoplastic radiation therapy: Secondary | ICD-10-CM | POA: Diagnosis not present

## 2020-11-19 ENCOUNTER — Ambulatory Visit
Admission: RE | Admit: 2020-11-19 | Discharge: 2020-11-19 | Disposition: A | Discharge status: Home / Self Care | Payer: Medicare Other | Source: Ambulatory Visit | Attending: Radiation Oncology | Admitting: Radiation Oncology

## 2020-11-19 DIAGNOSIS — Z510 Encounter for antineoplastic radiation therapy: Secondary | ICD-10-CM | POA: Insufficient documentation

## 2020-11-19 DIAGNOSIS — C3411 Malignant neoplasm of upper lobe, right bronchus or lung: Secondary | ICD-10-CM | POA: Diagnosis present

## 2020-11-20 ENCOUNTER — Ambulatory Visit
Admission: RE | Admit: 2020-11-20 | Discharge: 2020-11-20 | Disposition: A | Discharge status: Home / Self Care | Payer: Medicare Other | Source: Ambulatory Visit | Attending: Radiation Oncology | Admitting: Radiation Oncology

## 2020-11-20 DIAGNOSIS — Z510 Encounter for antineoplastic radiation therapy: Secondary | ICD-10-CM | POA: Diagnosis not present

## 2020-11-21 ENCOUNTER — Inpatient Hospital Stay: Payer: Medicare Other

## 2020-11-21 ENCOUNTER — Ambulatory Visit
Admission: RE | Admit: 2020-11-21 | Discharge: 2020-11-21 | Disposition: A | Discharge status: Home / Self Care | Payer: Medicare Other | Source: Ambulatory Visit | Attending: Radiation Oncology | Admitting: Radiation Oncology

## 2020-11-21 ENCOUNTER — Inpatient Hospital Stay (HOSPITAL_BASED_OUTPATIENT_CLINIC_OR_DEPARTMENT_OTHER): Payer: Medicare Other | Admitting: Oncology

## 2020-11-21 ENCOUNTER — Inpatient Hospital Stay: Payer: Medicare Other | Attending: Oncology

## 2020-11-21 ENCOUNTER — Encounter: Payer: Self-pay | Admitting: Oncology

## 2020-11-21 VITALS — BP 130/74 | HR 92 | Temp 97.8°F | Resp 18 | Wt 197.1 lb

## 2020-11-21 DIAGNOSIS — I739 Peripheral vascular disease, unspecified: Secondary | ICD-10-CM | POA: Insufficient documentation

## 2020-11-21 DIAGNOSIS — E782 Mixed hyperlipidemia: Secondary | ICD-10-CM

## 2020-11-21 DIAGNOSIS — Z7901 Long term (current) use of anticoagulants: Secondary | ICD-10-CM | POA: Diagnosis not present

## 2020-11-21 DIAGNOSIS — Z7902 Long term (current) use of antithrombotics/antiplatelets: Secondary | ICD-10-CM | POA: Insufficient documentation

## 2020-11-21 DIAGNOSIS — D709 Neutropenia, unspecified: Secondary | ICD-10-CM | POA: Diagnosis not present

## 2020-11-21 DIAGNOSIS — R042 Hemoptysis: Secondary | ICD-10-CM | POA: Diagnosis not present

## 2020-11-21 DIAGNOSIS — Z86718 Personal history of other venous thrombosis and embolism: Secondary | ICD-10-CM | POA: Insufficient documentation

## 2020-11-21 DIAGNOSIS — R059 Cough, unspecified: Secondary | ICD-10-CM | POA: Diagnosis not present

## 2020-11-21 DIAGNOSIS — R590 Localized enlarged lymph nodes: Secondary | ICD-10-CM | POA: Insufficient documentation

## 2020-11-21 DIAGNOSIS — R7303 Prediabetes: Secondary | ICD-10-CM

## 2020-11-21 DIAGNOSIS — Z452 Encounter for adjustment and management of vascular access device: Secondary | ICD-10-CM | POA: Diagnosis not present

## 2020-11-21 DIAGNOSIS — D649 Anemia, unspecified: Secondary | ICD-10-CM | POA: Insufficient documentation

## 2020-11-21 DIAGNOSIS — Z510 Encounter for antineoplastic radiation therapy: Secondary | ICD-10-CM | POA: Diagnosis not present

## 2020-11-21 DIAGNOSIS — C3411 Malignant neoplasm of upper lobe, right bronchus or lung: Secondary | ICD-10-CM | POA: Diagnosis present

## 2020-11-21 DIAGNOSIS — C3491 Malignant neoplasm of unspecified part of right bronchus or lung: Secondary | ICD-10-CM | POA: Diagnosis not present

## 2020-11-21 DIAGNOSIS — Z87891 Personal history of nicotine dependence: Secondary | ICD-10-CM | POA: Diagnosis not present

## 2020-11-21 DIAGNOSIS — Z0000 Encounter for general adult medical examination without abnormal findings: Secondary | ICD-10-CM

## 2020-11-21 LAB — COMPREHENSIVE METABOLIC PANEL
ALT: 30 U/L (ref 0–44)
AST: 29 U/L (ref 15–41)
Albumin: 3.9 g/dL (ref 3.5–5.0)
Alkaline Phosphatase: 82 U/L (ref 38–126)
Anion gap: 10 (ref 5–15)
BUN: 13 mg/dL (ref 8–23)
CO2: 25 mmol/L (ref 22–32)
Calcium: 9.2 mg/dL (ref 8.9–10.3)
Chloride: 104 mmol/L (ref 98–111)
Creatinine, Ser: 0.88 mg/dL (ref 0.61–1.24)
GFR, Estimated: >60 mL/min (ref >60)
Glucose, Bld: 124 mg/dL — ABNORMAL HIGH (ref 70–99)
Potassium: 3.8 mmol/L (ref 3.5–5.1)
Sodium: 139 mmol/L (ref 135–145)
Total Bilirubin: 0.4 mg/dL (ref 0.3–1.2)
Total Protein: 7.5 g/dL (ref 6.5–8.1)

## 2020-11-21 LAB — URINALYSIS, COMPLETE (UACMP) WITH MICROSCOPIC
Bacteria, UA: NONE SEEN
Bilirubin Urine: NEGATIVE
Glucose, UA: NEGATIVE mg/dL
Hgb urine dipstick: NEGATIVE
Ketones, ur: NEGATIVE mg/dL
Leukocytes,Ua: NEGATIVE
Nitrite: NEGATIVE
Protein, ur: NEGATIVE mg/dL
Specific Gravity, Urine: 1.014 (ref 1.005–1.030)
Squamous Epithelial / HPF: NONE SEEN (ref 0–5)
pH: 5 (ref 5.0–8.0)

## 2020-11-21 LAB — CBC WITH DIFFERENTIAL/PLATELET
Abs Immature Granulocytes: 0 10*3/uL (ref 0.00–0.07)
Basophils Absolute: 0 10*3/uL (ref 0.0–0.1)
Basophils Relative: 1 %
Eosinophils Absolute: 0 10*3/uL (ref 0.0–0.5)
Eosinophils Relative: 2 %
HCT: 32.9 % — ABNORMAL LOW (ref 39.0–52.0)
Hemoglobin: 11 g/dL — ABNORMAL LOW (ref 13.0–17.0)
Immature Granulocytes: 0 %
Lymphocytes Relative: 35 %
Lymphs Abs: 0.6 10*3/uL — ABNORMAL LOW (ref 0.7–4.0)
MCH: 28.1 pg (ref 26.0–34.0)
MCHC: 33.4 g/dL (ref 30.0–36.0)
MCV: 83.9 fL (ref 80.0–100.0)
Monocytes Absolute: 0.2 10*3/uL (ref 0.1–1.0)
Monocytes Relative: 15 %
Neutro Abs: 0.8 10*3/uL — ABNORMAL LOW (ref 1.7–7.7)
Neutrophils Relative %: 47 %
Platelets: 247 10*3/uL (ref 150–400)
RBC: 3.92 MIL/uL — ABNORMAL LOW (ref 4.22–5.81)
RDW: 17.7 % — ABNORMAL HIGH (ref 11.5–15.5)
WBC: 1.7 10*3/uL — ABNORMAL LOW (ref 4.0–10.5)
nRBC: 0 % (ref 0.0–0.2)

## 2020-11-21 LAB — LIPID PANEL
Cholesterol: 130 mg/dL (ref 0–200)
HDL: 45 mg/dL (ref >40)
LDL Cholesterol: 64 mg/dL (ref 0–99)
Total CHOL/HDL Ratio: 2.9 RATIO
Triglycerides: 106 mg/dL (ref <150)
VLDL: 21 mg/dL (ref 0–40)

## 2020-11-21 LAB — HEMOGLOBIN A1C
Hgb A1c MFr Bld: 5.8 % — ABNORMAL HIGH (ref 4.8–5.6)
Mean Plasma Glucose: 119.76 mg/dL

## 2020-11-21 LAB — PSA: Prostatic Specific Antigen: 2.1 ng/mL (ref 0.00–4.00)

## 2020-11-21 MED ORDER — HEPARIN SOD (PORK) LOCK FLUSH 100 UNIT/ML IV SOLN
500.0000 [IU] | Freq: Once | INTRAVENOUS | Status: AC
  Administered 2020-11-21: 500 [IU] via INTRAVENOUS
  Filled 2020-11-21: qty 5

## 2020-11-21 MED ORDER — SODIUM CHLORIDE 0.9% FLUSH
10.0000 mL | Freq: Once | INTRAVENOUS | Status: AC
  Administered 2020-11-21: 10 mL via INTRAVENOUS
  Filled 2020-11-21: qty 10

## 2020-11-21 NOTE — Progress Notes (Signed)
DISCONTINUE ON PATHWAY REGIMEN - Non-Small Cell Lung     Administer weekly:     Paclitaxel      Carboplatin   **Always confirm dose/schedule in your pharmacy ordering system**  REASON: Continuation Of Treatment PRIOR TREATMENT: ZSW109: Carboplatin AUC=2 + Paclitaxel 45 mg/m2 Weekly During Radiation TREATMENT RESPONSE: Stable Disease (SD)  START ON PATHWAY REGIMEN - Non-Small Cell Lung     A cycle is every 14 days:     Durvalumab   **Always confirm dose/schedule in your pharmacy ordering system**  Patient Characteristics: Preoperative or Nonsurgical Candidate (Clinical Staging), Stage III - Nonsurgical Candidate (Nonsquamous and Squamous), PS = 0, 1 Therapeutic Status: Preoperative or Nonsurgical Candidate (Clinical Staging) AJCC T Category: cT1b AJCC N Category: cN2 AJCC M Category: cM0 AJCC 8 Stage Grouping: IIIA ECOG Performance Status: 1 Intent of Therapy: Curative Intent, Discussed with Patient

## 2020-11-21 NOTE — Progress Notes (Signed)
Pt here for follow up. No new concerns voiced.   

## 2020-11-21 NOTE — Progress Notes (Signed)
Hematology/Oncology Consult note [facility] [phone] [phone]   Patient Care Team: [name], MD as PCP - General (Physician Assistant) [name], MD as Referring Physician (Physician Assistant) Bary Castilla, [name], MD (General Surgery) Telford Nab, RN as Oncology Nurse Navigator  REFERRING PROVIDER: Virginia Crews, MD  CHIEF COMPLAINTS/REASON FOR VISIT:  Follow up for lung cancer  HISTORY OF PRESENTING ILLNESS:   Joshua [name] is a  65 y.o.  male with PMH listed below was seen in consultation at the request of  Virginia Crews, MD  for evaluation of lung mass  Patient presented to emergency room on 08/25/2020 for evaluation of hemoptysis, coughing.  He coughed up small amount of blood and a large clot.  No fever, chills, chest pain, abdominal pain.  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  Patient is on Plavix and Eliquis.  Since his ER visit, he has not coughed up more blood.  08/25/2020 CT angio chest PE with and without contrast showed ill-defined soft tissue adjacent to the right upper lobe bronchus resulting in narrowing of right upper lobe bronchus.  Also right paratracheal mediastinal adenopathy.  Scattered centrilobular groundglass opacities within the right lower lobe and to a lesser extent within the right lower lobe likely reflecting an infectious or inflammatory process. # 09/14/2020, right upper lobe lung biopsy and bronchoscopy brushing are both positive for squamous cell carcinoma.Right precarinal space lymph node insufficient lymph node sampling for staging. 09/18/2020, PET scan showed right upper lobe nodule maximal SUV 11.5.  Hypermetabolic cluster right paratracheal adenopathy with maximum SUV up to 12.  No findings of metastatic disease to the  neck/abdomen/pelvis or skeleton.  # 10/03/2000, MRI brain with and without contrast showed no evidence of intracranial metastatic disease. 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  # - NGS showed PD-L1 TPS 95%, LRP1B S94fs, PIK3CA E545K, RB1 c1422-2A>T, RIT1 T83_ A84del, STK11 P245fs, TPS53 R248W, TMB 19.44mut/mb, MS stable.   #10/10/2020 started concurrent chemoradiation.  Patient received 4 cycles of carboplatin AUC of 2 and Taxol 45 mg per metered square Additional chemotherapy was held due to thrombocytopenia, neutropenia INTERVAL HISTORY Joshua [name] is a 65 y.o. male who has above history reviewed by me today presents for follow up visit for Stage III lung squamous cell carcinoma. Problems and complaints are listed below: Platelet count was low at last visit.  Patient was instructed to discontinue Eliquis.  Discussed with vascular surgeon who agrees with the plan. Patient reports that he is concerned about recurrent thrombosis so he continued both Eliquis and Plavix.  Easy bruising.  No acute bleeding. Reports feeling well. "  Rattling in chest" have improved. Denies any fever, chills, nausea vomiting Review of Systems  Constitutional: Positive for fatigue. Negative for appetite change, chills, fever and unexpected weight change.  HENT:   Negative for hearing loss and voice change.   Eyes: Negative for eye problems and icterus.  Respiratory: Negative for chest tightness, cough, hemoptysis and shortness of breath.   Cardiovascular: Negative for chest pain and leg swelling.  Gastrointestinal: Negative for abdominal distention and abdominal pain.  Endocrine: Negative for hot flashes.  Genitourinary: Negative for difficulty urinating, dysuria and frequency.   Musculoskeletal: Negative for arthralgias.  Skin: Negative for itching and rash.  Neurological: Negative for  light-headedness and  numbness.  Hematological: Negative for adenopathy. Does not bruise/bleed easily.  Psychiatric/Behavioral: Negative for confusion.    MEDICAL HISTORY:  Past Medical History:  Diagnosis Date  . Allergy   . Dyspnea    former smoker. only doe  . History of kidney stones   . Hypertension   . Peripheral vascular disease ([place])   . Pre-diabetes   . Sleep apnea     SURGICAL HISTORY: Past Surgical History:  Procedure Laterality Date  . ANTERIOR CERVICAL DECOMP/DISCECTOMY FUSION N/A 03/14/2020   Procedure: ANTERIOR CERVICAL DECOMPRESSION/DISCECTOMY FUSION 3 LEVELS C4-7;  Surgeon: Meade Maw, MD;  Location: [facility];  Service: Neurosurgery;  Laterality: N/A;  . BACK SURGERY  2011  . CENTRAL LINE INSERTION Right 09/10/2016   Procedure: CENTRAL LINE INSERTION;  Surgeon: Katha Cabal, MD;  Location: [facility];  Service: Vascular;  Laterality: Right;  . CERVICAL FUSION     C4-c7 on Mar 14, 2020  . COLONOSCOPY  2013 ?  . CORONARY ANGIOPLASTY    . FEMORAL-POPLITEAL BYPASS GRAFT Right 09/10/2016   Procedure: BYPASS GRAFT FEMORAL-POPLITEAL ARTERY ( BELOW KNEE );  Surgeon: Katha Cabal, MD;  Location: [facility];  Service: Vascular;  Laterality: Right;  . FEMORAL-TIBIAL BYPASS GRAFT Right 03/23/2020   Procedure: BYPASS GRAFT RIGHT FEMORAL- DISTAL TIBIAL ARTERY WITH DISTAL FLOW GRAFT;  Surgeon: Katha Cabal, MD;  Location: [facility];  Service: Vascular;  Laterality: Right;  . LOWER EXTREMITY ANGIOGRAPHY Right 11/18/2016   Procedure: Lower Extremity Angiography;  Surgeon: Katha Cabal, MD;  Location: [place] [facility];  Service: Cardiovascular;  Laterality: Right;  . LOWER EXTREMITY ANGIOGRAPHY Right 12/09/2016   Procedure: Lower Extremity Angiography;  Surgeon: Katha Cabal, MD;  Location: [place] [facility];  Service: Cardiovascular;  Laterality: Right;  . LOWER EXTREMITY ANGIOGRAPHY Right 11/15/2019   Procedure: LOWER EXTREMITY ANGIOGRAPHY;  Surgeon: Katha Cabal, MD;  Location: [place] [facility];  Service: Cardiovascular;  Laterality: Right;  . LOWER EXTREMITY ANGIOGRAPHY Right 12/01/2019   Procedure: Lower Extremity Angiography;  Surgeon: Algernon Huxley, MD;  Location: [place] [facility];  Service: Cardiovascular;  Laterality: Right;  . LOWER EXTREMITY ANGIOGRAPHY Right 12/02/2019   Procedure: LOWER EXTREMITY ANGIOGRAPHY;  Surgeon: Katha Cabal, MD;  Location: [place] [facility];  Service: Cardiovascular;  Laterality: Right;  . LOWER EXTREMITY ANGIOGRAPHY Right 03/23/2020   Procedure: Lower Extremity Angiography;  Surgeon: Katha Cabal, MD;  Location: [place] [facility];  Service: Cardiovascular;  Laterality: Right;  . LOWER EXTREMITY INTERVENTION  11/18/2016   Procedure: Lower Extremity Intervention;  Surgeon: Katha Cabal, MD;  Location: [place] [facility];  Service: Cardiovascular;;  . PERIPHERAL VASCULAR CATHETERIZATION Right 01/02/2015   Procedure: Lower Extremity Angiography;  Surgeon: Katha Cabal, MD;  Location: [place] [facility];  Service: Cardiovascular;  Laterality: Right;  . PERIPHERAL VASCULAR CATHETERIZATION Right 06/18/2015   Procedure: Lower Extremity Angiography;  Surgeon: Algernon Huxley, MD;  Location: [place] [facility];  Service: Cardiovascular;  Laterality: Right;  . PERIPHERAL VASCULAR CATHETERIZATION  06/18/2015   Procedure: Lower Extremity Intervention;  Surgeon: Algernon Huxley, MD;  Location: [place] [facility];  Service: Cardiovascular;;  . PERIPHERAL VASCULAR CATHETERIZATION N/A 10/09/2015   Procedure: Abdominal Aortogram w/Lower Extremity;  Surgeon: Katha Cabal, MD;  Location: [place] [facility];  Service: Cardiovascular;  Laterality: N/A;  . PERIPHERAL VASCULAR CATHETERIZATION  10/09/2015   Procedure: Lower Extremity Intervention;  Surgeon: Belenda Cruise  Eloise Levels, MD;  Location: [place] [facility];  Service: Cardiovascular;;  . PERIPHERAL VASCULAR CATHETERIZATION Right 10/10/2015   Procedure:  Lower Extremity Angiography;  Surgeon: Algernon Huxley, MD;  Location: [place] [facility];  Service: Cardiovascular;  Laterality: Right;  . PERIPHERAL VASCULAR CATHETERIZATION Right 10/10/2015   Procedure: Lower Extremity Intervention;  Surgeon: Algernon Huxley, MD;  Location: [place] [facility];  Service: Cardiovascular;  Laterality: Right;  . PERIPHERAL VASCULAR CATHETERIZATION Right 12/03/2015   Procedure: Lower Extremity Angiography;  Surgeon: Algernon Huxley, MD;  Location: [place] [facility];  Service: Cardiovascular;  Laterality: Right;  . PERIPHERAL VASCULAR CATHETERIZATION Right 12/04/2015   Procedure: Lower Extremity Angiography;  Surgeon: Algernon Huxley, MD;  Location: [place] [facility];  Service: Cardiovascular;  Laterality: Right;  . PERIPHERAL VASCULAR CATHETERIZATION  12/04/2015   Procedure: Lower Extremity Intervention;  Surgeon: Algernon Huxley, MD;  Location: [place] [facility];  Service: Cardiovascular;;  . PERIPHERAL VASCULAR CATHETERIZATION Right 06/30/2016   Procedure: Lower Extremity Angiography;  Surgeon: Algernon Huxley, MD;  Location: [place] [facility];  Service: Cardiovascular;  Laterality: Right;  . PERIPHERAL VASCULAR CATHETERIZATION Right 06/25/2016   Procedure: Lower Extremity Angiography;  Surgeon: Katha Cabal, MD;  Location: [place] [facility];  Service: Cardiovascular;  Laterality: Right;  . PERIPHERAL VASCULAR CATHETERIZATION Right 07/01/2016   Procedure: Lower Extremity Angiography;  Surgeon: Katha Cabal, MD;  Location: [place] [facility];  Service: Cardiovascular;  Laterality: Right;  . PERIPHERAL VASCULAR CATHETERIZATION  07/01/2016   Procedure: Lower Extremity Intervention;  Surgeon: Katha Cabal, MD;  Location: [place] [facility];  Service: Cardiovascular;;  . PERIPHERAL VASCULAR CATHETERIZATION Right 08/01/2016   Procedure: Lower Extremity Angiography;  Surgeon: Katha Cabal, MD;  Location: [place] [facility];  Service: Cardiovascular;   Laterality: Right;  . PORTA CATH INSERTION N/A 10/05/2020   Procedure: PORTA CATH INSERTION;  Surgeon: Katha Cabal, MD;  Location: [place] [facility];  Service: Cardiovascular;  Laterality: N/A;  . stent placement in right leg Right   . VIDEO BRONCHOSCOPY N/A 09/14/2020   Procedure: VIDEO BRONCHOSCOPY WITH FLUORO;  Surgeon: Tyler Pita, MD;  Location: [facility];  Service: Cardiopulmonary;  Laterality: N/A;  . VIDEO BRONCHOSCOPY WITH ENDOBRONCHIAL ULTRASOUND N/A 09/14/2020   Procedure: VIDEO BRONCHOSCOPY WITH ENDOBRONCHIAL ULTRASOUND;  Surgeon: Tyler Pita, MD;  Location: [facility];  Service: Cardiopulmonary;  Laterality: N/A;    SOCIAL HISTORY: Social History   Socioeconomic History  . Marital status: Single    Spouse name: Not on file  . Number of children: Not on file  . Years of education: Not on file  . Highest education level: Not on file  Occupational History  . Occupation: unemployed  Tobacco Use  . Smoking status: Former Smoker    Packs/day: 2.00    Years: 38.00    Pack years: 76.00    Types: Cigarettes    Quit date: 12/20/2014    Years since quitting: 5.9  . Smokeless tobacco: Former Systems developer  . Tobacco comment: quit smoking in 2017 most smoked 2   Vaping Use  . Vaping Use: Never used  Substance and Sexual Activity  . Alcohol use: Not Currently  . Drug use: No  . Sexual activity: Yes  Other Topics Concern  . Not on file  Social History Narrative  . Not on file   Social Determinants of Health   Financial Resource Strain: Not on file  Food  Insecurity: Not on file  Transportation Needs: Not on file  Physical Activity: Not on file  Stress: Not on file  Social Connections: Not on file  Intimate Partner Violence: Not on file    FAMILY HISTORY: Family History  Problem Relation Age of Onset  . Diabetes Mother   . Hypertension Mother   . Heart murmur Mother   . Leukemia Mother   . Throat cancer Maternal Grandmother   . Colon cancer Neg Hx   .  Breast cancer Neg Hx     ALLERGIES:  is allergic to tape.  MEDICATIONS:  Current Outpatient Medications  Medication Sig Dispense Refill  . acetaminophen (TYLENOL) 325 MG tablet Take 650 mg by mouth every 6 (six) hours as needed for moderate pain.    . Budeson-Glycopyrrol-Formoterol (BREZTRI AEROSPHERE) 160-9-4.8 MCG/ACT AERO Inhale 160 mcg into the lungs in the morning and at bedtime. 5.9 g 0  . chlorhexidine (PERIDEX) 0.12 % solution Use as directed 15 mLs in the mouth or throat 2 (two) times daily. 473 mL 1  . clopidogrel (PLAVIX) 75 MG tablet Take 1 tablet (75 mg total) by mouth daily at 6 (six) AM. 60 tablet 1  . ELIQUIS 5 MG TABS tablet TAKE 1 TABLET BY MOUTH  TWICE DAILY 180 tablet 3  . gabapentin (NEURONTIN) 300 MG capsule Take 2 capsules (600 mg total) by mouth 3 (three) times daily. (Patient taking differently: Take 900 mg by mouth at bedtime as needed (pain).) 540 capsule 1  . lidocaine-prilocaine (EMLA) cream Apply to affected area once 30 g 3  . lisinopril (ZESTRIL) 10 MG tablet TAKE 1 TABLET BY MOUTH  DAILY 90 tablet 0  . ondansetron (ZOFRAN) 8 MG tablet Take 1 tablet (8 mg total) by mouth 2 (two) times daily as needed for refractory nausea / vomiting. Start on day 3 after chemo. 30 tablet 1  . prochlorperazine (COMPAZINE) 10 MG tablet Take 1 tablet (10 mg total) by mouth every 6 (six) hours as needed (Nausea or vomiting). 30 tablet 1  . sennosides-docusate sodium (SENOKOT-S) 8.6-50 MG tablet Take 1 tablet by mouth daily as needed for constipation.    . simvastatin (ZOCOR) 40 MG tablet TAKE 1 TABLET BY MOUTH AT  BEDTIME 90 tablet 0  . sucralfate (CARAFATE) 1 g tablet Take 1 tablet (1 g total) by mouth 3 (three) times daily. 90 tablet 0   No current facility-administered medications for this visit.     PHYSICAL EXAMINATION: ECOG PERFORMANCE STATUS: 1 - Symptomatic but completely ambulatory Vitals:   11/21/20 1101  BP: 130/74  Pulse: 92  Resp: 18  Temp: 97.8 F (36.6 C)   SpO2: 100%   Filed Weights   11/21/20 1101  Weight: 197 lb 1.6 oz (89.4 kg)    Physical Exam Constitutional:      General: He is not in acute distress.    Comments: Walks with a cane.   HENT:     Head: Normocephalic and atraumatic.  Eyes:     General: No scleral icterus. Cardiovascular:     Rate and Rhythm: Normal rate and regular rhythm.     Heart sounds: Normal heart sounds.  Pulmonary:     Effort: Pulmonary effort is normal. No respiratory distress.     Breath sounds: No wheezing.  Abdominal:     General: Bowel sounds are normal. There is no distension.     Palpations: Abdomen is soft.  Musculoskeletal:        General: No deformity. Normal range  of motion.     Cervical back: Normal range of motion and neck supple.  Skin:    General: Skin is warm and dry.     Findings: No erythema or rash.  Neurological:     Mental Status: He is alert and oriented to person, place, and time. Mental status is at baseline.     Cranial Nerves: No cranial nerve deficit.     Coordination: Coordination normal.  Psychiatric:        Mood and Affect: Mood normal.     LABORATORY DATA:  I have reviewed the data as listed Lab Results  Component Value Date   WBC 1.7 (L) 11/21/2020   HGB 11.0 (L) 11/21/2020   HCT 32.9 (L) 11/21/2020   MCV 83.9 11/21/2020   PLT 247 11/21/2020   Recent Labs    03/28/20 0421 03/29/20 0444 03/30/20 0536 04/16/20 1525 08/25/20 1512 11/07/20 0808 11/14/20 0805 11/21/20 1032  NA 133* 139 140  --    < > 138 138 139  K 4.1 4.0 4.1  --    < > 4.2 3.8 3.8  CL 101 103 102  --    < > 104 104 104  CO2 $Re'28 28 28  'pkj$ --    < > $R'26 23 25  'HO$ GLUCOSE 116* 101* 99  --    < > 105* 132* 124*  BUN $Re'11 12 12  'rGF$ --    < > $R'14 19 13  'Zf$ CREATININE 0.76 0.85 0.76  --    < > 0.90 1.00 0.88  CALCIUM 8.2* 8.4* 8.6*  --    < > 8.9 8.8* 9.2  GFRNONAA >60 >60 >60  --    < > >60 >60 >60  GFRAA >60 >60 >60  --   --   --   --   --   PROT  --   --   --  7.0   < > 7.0 6.7 7.5  ALBUMIN  --    --   --  4.3   < > 3.8 3.5 3.9  AST  --   --   --  17   < > $R'20 25 29  'Vq$ ALT  --   --   --  19   < > $R'23 23 30  'MR$ ALKPHOS  --   --   --  123*   < > 73 69 82  BILITOT  --   --   --  0.4   < > 0.9 0.5 0.4  BILIDIR  --   --   --  0.10  --   --   --   --    < > = values in this interval not displayed.   Iron/TIBC/Ferritin/ %Sat    Component Value Date/Time   IRON 48 08/30/2020 1052   TIBC 391 08/30/2020 1052   FERRITIN 25 08/30/2020 1052   IRONPCTSAT 12 (L) 08/30/2020 1052      RADIOGRAPHIC STUDIES: I have personally reviewed the radiological images as listed and agreed with the findings in the report. [place] CORE BIOPSY (SALIVARY GLAND/PAROTID GLAND)  Result Date: 11/07/2020 CLINICAL DATA:  Left parotid gland mass. EXAM: ULTRASOUND GUIDED CORE BIOPSY OF LEFT PAROTID MASS MEDICATIONS: None PROCEDURE: The procedure, risks, benefits, and alternatives were explained to the patient. Questions regarding the procedure were encouraged and answered. The patient understands and consents to the procedure. A time out was performed prior to initiating the procedure. Ultrasound was performed to localize a left parotid mass. The  left face was prepped with chlorhexidine in a sterile fashion, and a sterile drape was applied covering the operative field. A sterile gown and sterile gloves were used for the procedure. Local anesthesia was provided with 1% Lidocaine. Under ultrasound guidance, an 18 gauge core biopsy device was utilized in obtaining 4 separate core biopsy samples of a left parotid mass. Core biopsy samples were submitted in formalin. COMPLICATIONS: None. FINDINGS: Smoothly marginated, oval shaped lesion of the tail of the left parotid gland measures approximately 2.5 x 1.4 x 1.7 cm by ultrasound. Solid tissue was obtained with core biopsy. IMPRESSION: Ultrasound-guided core biopsy performed of a left parotid gland mass measuring up to 2.5 cm in maximum diameter by ultrasound. Electronically Signed   By: Aletta Edouard M.D.   On: 11/07/2020 15:22      ASSESSMENT & PLAN:  1. Squamous cell carcinoma of right lung ([place])   2. Annual physical exam   3. Prediabetes   4. Mixed hyperlipidemia   Cancer Staging Squamous cell carcinoma of right lung [facility]) Staging form: Lung, AJCC 8th Edition - Clinical stage from 09/20/2020: Stage IIIA (cT1b, cN2, cM0) - Signed by [name], MD on 09/20/2020   #Squamous cell carcinoma of right lung, stage IIIA, Labs reviewed and discussed with patient. Hold chemotherapy due to persistent neutropenia  Patient has 4 more radiation treatments.  We will forego additional chemotherapy at this point.  #Neutropenia, ANC 0.8.  Hold treatment. 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  Management per Dr. Tami Ribas #Thrombocytopenia, resolved.  Continue both Eliquis and Plavix. #Anemia, likely due to chemotherapy and radiation. Stable hemoglobin. Monitor.   Plan to obtain CT chest with contrast in 4 weeks after his last dose of radiation for evaluation of disease status. Discussed with patient about next phase of treatment with durvalumab maintenance every 2 weeks for up to 1 year.  He agrees. All questions were answered. The patient knows to call the clinic with any problems questions or concerns.  cc Virginia Crews, MD    Return of visit: 4-5 weeks to start developing maintenance. [name], MD, PhD Hematology Oncology [facility] Pager- [phone] 11/21/2020

## 2020-11-22 ENCOUNTER — Ambulatory Visit
Admission: RE | Admit: 2020-11-22 | Discharge: 2020-11-22 | Disposition: A | Discharge status: Home / Self Care | Payer: Medicare Other | Source: Ambulatory Visit | Attending: Radiation Oncology | Admitting: Radiation Oncology

## 2020-11-22 DIAGNOSIS — Z510 Encounter for antineoplastic radiation therapy: Secondary | ICD-10-CM | POA: Diagnosis not present

## 2020-11-23 ENCOUNTER — Ambulatory Visit
Admission: RE | Admit: 2020-11-23 | Discharge: 2020-11-23 | Disposition: A | Discharge status: Home / Self Care | Payer: Medicare Other | Source: Ambulatory Visit | Attending: Radiation Oncology | Admitting: Radiation Oncology

## 2020-11-23 DIAGNOSIS — Z510 Encounter for antineoplastic radiation therapy: Secondary | ICD-10-CM | POA: Diagnosis not present

## 2020-11-26 ENCOUNTER — Ambulatory Visit
Admission: RE | Admit: 2020-11-26 | Discharge: 2020-11-26 | Disposition: A | Discharge status: Home / Self Care | Payer: Medicare Other | Source: Ambulatory Visit | Attending: Radiation Oncology | Admitting: Radiation Oncology

## 2020-11-26 DIAGNOSIS — Z510 Encounter for antineoplastic radiation therapy: Secondary | ICD-10-CM | POA: Diagnosis not present

## 2020-11-27 ENCOUNTER — Ambulatory Visit
Admission: RE | Admit: 2020-11-27 | Discharge: 2020-11-27 | Disposition: A | Discharge status: Home / Self Care | Payer: Medicare Other | Source: Ambulatory Visit | Attending: Radiation Oncology | Admitting: Radiation Oncology

## 2020-11-27 DIAGNOSIS — Z510 Encounter for antineoplastic radiation therapy: Secondary | ICD-10-CM | POA: Diagnosis not present

## 2020-12-06 ENCOUNTER — Other Ambulatory Visit: Payer: Self-pay | Admitting: Family Medicine

## 2020-12-06 ENCOUNTER — Encounter: Payer: Self-pay | Admitting: *Deleted

## 2020-12-19 ENCOUNTER — Other Ambulatory Visit: Payer: Self-pay | Admitting: Family Medicine

## 2020-12-25 ENCOUNTER — Ambulatory Visit: Payer: Medicare Other | Admitting: Pulmonary Disease

## 2020-12-25 ENCOUNTER — Other Ambulatory Visit: Payer: Self-pay

## 2020-12-25 ENCOUNTER — Ambulatory Visit
Admission: RE | Admit: 2020-12-25 | Discharge: 2020-12-25 | Disposition: A | Discharge status: Home / Self Care | Payer: Medicare Other | Source: Ambulatory Visit | Attending: Oncology | Admitting: Oncology

## 2020-12-25 ENCOUNTER — Encounter: Payer: Self-pay | Admitting: Pulmonary Disease

## 2020-12-25 VITALS — BP 122/68 | HR 91 | Temp 97.5°F | Ht 70.0 in | Wt 199.2 lb

## 2020-12-25 DIAGNOSIS — C3491 Malignant neoplasm of unspecified part of right bronchus or lung: Secondary | ICD-10-CM

## 2020-12-25 DIAGNOSIS — J449 Chronic obstructive pulmonary disease, unspecified: Secondary | ICD-10-CM | POA: Diagnosis not present

## 2020-12-25 LAB — POCT I-STAT CREATININE: Creatinine, Ser: 1 mg/dL (ref 0.61–1.24)

## 2020-12-25 MED ORDER — IOHEXOL 300 MG/ML  SOLN
75.0000 mL | Freq: Once | INTRAMUSCULAR | Status: AC | PRN
  Administered 2020-12-25: 75 mL via INTRAVENOUS

## 2020-12-25 NOTE — Patient Instructions (Signed)
All in all you are doing very well.  I will review your CT scan of the chest once it is uploaded.  We will see him in follow-up in 6 months time.  Call sooner should any new problems arise.

## 2020-12-25 NOTE — Progress Notes (Signed)
Subjective:    Patient ID: Joshua [name], male    DOB: 08-06-1955, 65 y.o.   MRN: [id number] Chief Complaint  Patient presents with   Follow-up    Sob with exertion and non prod cough.     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  This is a scheduled visit. The patient has been feeling less dyspneic since bronchoscopy procedure was performed.  He did undergo balloon dilation of the right upper lobe.  He has started treatment for his lung cancer he is followed by Dr. Tasia Catchings and Dr. Baruch Gouty for this.  On a prior visit we had given him a trial of Breztri which he found beneficial however, he has now stopped this medication, he cannot explain why.  Overall he feels that his breathing is pretty much at baseline currently.  He has dyspnea class II.  He does not endorse any fevers, chills or sweats.  No cough or sputum production, no hemoptysis. He does not endorse any other complaint today.  Overall he feels well and looks well.   Review of Systems A 10 point review of systems was performed and it is as noted above otherwise negative.  Patient Active Problem List   Diagnosis Date Noted   Antineoplastic chemotherapy induced anemia 10/17/2020   Squamous cell carcinoma of right lung ([facility]) 09/20/2020   Goals of care, counseling/discussion 08/30/2020   Iron deficiency anemia 08/30/2020   Lung mass 08/30/2020   Ischemia of extremity 03/23/2020   Cervical myelopathy ([place]) 03/14/2020   Ischemia of right lower extremity 11/15/2019   Peripheral neuropathy 10/14/2019   Chronic left-sided low back pain without sciatica 04/13/2019   History of back surgery 04/13/2019   Overweight 04/13/2019   Atherosclerosis of lower extremity with claudication ([place]) 35/36/1443   Complication associated with vascular device  11/13/2016   Hyperlipidemia 06/18/2016   Hypertension 06/18/2016   Radiculopathy of lumbar region 06/10/2016   PVD (peripheral vascular disease) ([place]) 05/08/2016   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[place]) 01/02/2015   Prediabetes 08/25/2014   Social History   Tobacco Use   Smoking status: Former Smoker    Packs/day: 2.00    Years: 38.00    Pack years: 76.00    Types: Cigarettes    Quit date: 12/20/2014    Years since quitting: 6.0   Smokeless tobacco: Former Systems developer   Tobacco comment: quit smoking in 2017 most smoked 2   Substance Use Topics   Alcohol use: Not Currently   Allergies  Allergen Reactions   Tape     Honey[place] Dressing tape- burns his skin    Current Meds  Medication Sig   acetaminophen (TYLENOL) 325 MG tablet Take 650 mg by mouth every 6 (six) hours as needed for moderate pain.   clopidogrel (PLAVIX) 75 MG tablet Take 1 tablet (75 mg total) by mouth daily at 6 (six) AM.   ELIQUIS 5 MG TABS tablet TAKE 1 TABLET BY MOUTH  TWICE DAILY   gabapentin (NEURONTIN) 300 MG capsule Take 2 capsules (600 mg total) by mouth 3 (three) times daily. (Patient taking differently: Take 900 mg by mouth at bedtime as needed (pain).)   lidocaine-prilocaine (EMLA) cream Apply to affected area once  lisinopril (ZESTRIL) 10 MG tablet TAKE 1 TABLET BY MOUTH  DAILY   simvastatin (ZOCOR) 40 MG tablet TAKE 1 TABLET BY MOUTH AT  BEDTIME   Immunization History  Administered Date(s) Administered   Influenza,inj,Quad PF,6+ Mos 09/14/2016, 04/13/2019, 04/16/2020   PFIZER(Purple Top)SARS-COV-2 Vaccination 10/20/2019, 11/29/2019   Tdap 04/16/2020       Objective:   Physical Exam BP 122/68 (BP Location: Left Arm, Cuff Size: Normal)   Pulse 91   Temp (!) 97.5 F (36.4 C) (Temporal)   Ht 5\' 10"  (1.778 m)   Wt 199 lb 3.2 oz (90.4 kg)   SpO2 98%   BMI 28.58 kg/m  GENERAL: Chronically ill-appearing  well-developed well-nourished gentleman, no acute distress. Walks with assistance of a cane. HEAD: Normocephalic, atraumatic. EYES: Pupils equal, round, reactive to light.  No scleral icterus. MOUTH: Nose/mouth/throat not examined due to masking requirements for COVID 19. NECK: Supple.  Limited ROM due to prior spinal fusion.  No thyromegaly. Trachea midline. No JVD.  No adenopathy. PULMONARY: Good air entry bilaterally.  Few rhonchi, no wheezes. CARDIOVASCULAR: S1 and S2. Regular rate and rhythm.  No rubs, murmurs or gallops heard. ABDOMEN: Benign. MUSCULOSKELETAL: No joint deformity, no clubbing, no edema. NEUROLOGIC: No overt focal deficit, walks with assistance of a cane, speech is fluent. SKIN: Intact,warm,dry. PSYCH: Mood and behavior normal.      Assessment & Plan:     ICD-10-CM   1. COPD, severity to be determined ([facility])  J44.9    Discontinued Breztri on own States doing well Would like to defer PFTs for now Still undergoing cancer therapy    2. Squamous cell carcinoma of right lung ([facility])  C34.91    Concomitant chemo XRT per [place] To have CT chest today Will review CT when ready     Patient currently feels like he is doing well.  He discontinued Breztri on his own and states that he feels his breathing is okay.  He would like to defer PFTs for now as he still undergoing therapy for his cancer.  He is to have CT chest today.  We will review that when available.  Otherwise we will see him in follow-up in 6 months time he is to contact us prior to that time should any new difficulties arise.  Renold Don, MD Advanced Bronchoscopy P[facility]  [facility]    *This note was dictated using voice recognition software/Dragon.  Despite best efforts to proofread, errors can occur which can change the meaning.  Any change was purely unintentional.

## 2020-12-31 ENCOUNTER — Inpatient Hospital Stay (HOSPITAL_BASED_OUTPATIENT_CLINIC_OR_DEPARTMENT_OTHER): Payer: Medicare Other | Admitting: Oncology

## 2020-12-31 ENCOUNTER — Encounter: Payer: Self-pay | Admitting: Oncology

## 2020-12-31 ENCOUNTER — Inpatient Hospital Stay: Payer: Medicare Other | Attending: Oncology

## 2020-12-31 ENCOUNTER — Inpatient Hospital Stay: Payer: Medicare Other

## 2020-12-31 ENCOUNTER — Other Ambulatory Visit: Payer: Self-pay

## 2020-12-31 VITALS — BP 124/73 | HR 97 | Temp 98.7°F | Resp 18 | Wt 200.9 lb

## 2020-12-31 DIAGNOSIS — Z5112 Encounter for antineoplastic immunotherapy: Secondary | ICD-10-CM | POA: Diagnosis present

## 2020-12-31 DIAGNOSIS — C3411 Malignant neoplasm of upper lobe, right bronchus or lung: Secondary | ICD-10-CM | POA: Insufficient documentation

## 2020-12-31 DIAGNOSIS — Z95828 Presence of other vascular implants and grafts: Secondary | ICD-10-CM

## 2020-12-31 DIAGNOSIS — Z87891 Personal history of nicotine dependence: Secondary | ICD-10-CM | POA: Insufficient documentation

## 2020-12-31 DIAGNOSIS — Z7901 Long term (current) use of anticoagulants: Secondary | ICD-10-CM | POA: Insufficient documentation

## 2020-12-31 DIAGNOSIS — Z20822 Contact with and (suspected) exposure to covid-19: Secondary | ICD-10-CM | POA: Diagnosis not present

## 2020-12-31 DIAGNOSIS — Z452 Encounter for adjustment and management of vascular access device: Secondary | ICD-10-CM | POA: Insufficient documentation

## 2020-12-31 DIAGNOSIS — D6481 Anemia due to antineoplastic chemotherapy: Secondary | ICD-10-CM | POA: Insufficient documentation

## 2020-12-31 DIAGNOSIS — C3491 Malignant neoplasm of unspecified part of right bronchus or lung: Secondary | ICD-10-CM

## 2020-12-31 DIAGNOSIS — R000 Tachycardia, unspecified: Secondary | ICD-10-CM | POA: Insufficient documentation

## 2020-12-31 DIAGNOSIS — Z79899 Other long term (current) drug therapy: Secondary | ICD-10-CM | POA: Diagnosis not present

## 2020-12-31 DIAGNOSIS — J439 Emphysema, unspecified: Secondary | ICD-10-CM | POA: Insufficient documentation

## 2020-12-31 DIAGNOSIS — R079 Chest pain, unspecified: Secondary | ICD-10-CM | POA: Insufficient documentation

## 2020-12-31 DIAGNOSIS — T451X5A Adverse effect of antineoplastic and immunosuppressive drugs, initial encounter: Secondary | ICD-10-CM | POA: Diagnosis not present

## 2020-12-31 DIAGNOSIS — Z7902 Long term (current) use of antithrombotics/antiplatelets: Secondary | ICD-10-CM | POA: Insufficient documentation

## 2020-12-31 LAB — CBC WITH DIFFERENTIAL/PLATELET
Abs Immature Granulocytes: 0.01 10*3/uL (ref 0.00–0.07)
Basophils Absolute: 0.1 10*3/uL (ref 0.0–0.1)
Basophils Relative: 2 %
Eosinophils Absolute: 0.4 10*3/uL (ref 0.0–0.5)
Eosinophils Relative: 8 %
HCT: 35.6 % — ABNORMAL LOW (ref 39.0–52.0)
Hemoglobin: 11.5 g/dL — ABNORMAL LOW (ref 13.0–17.0)
Immature Granulocytes: 0 %
Lymphocytes Relative: 17 %
Lymphs Abs: 0.9 10*3/uL (ref 0.7–4.0)
MCH: 28.3 pg (ref 26.0–34.0)
MCHC: 32.3 g/dL (ref 30.0–36.0)
MCV: 87.7 fL (ref 80.0–100.0)
Monocytes Absolute: 0.7 10*3/uL (ref 0.1–1.0)
Monocytes Relative: 12 %
Neutro Abs: 3.4 10*3/uL (ref 1.7–7.7)
Neutrophils Relative %: 61 %
Platelets: 165 10*3/uL (ref 150–400)
RBC: 4.06 MIL/uL — ABNORMAL LOW (ref 4.22–5.81)
RDW: 18 % — ABNORMAL HIGH (ref 11.5–15.5)
WBC: 5.5 10*3/uL (ref 4.0–10.5)
nRBC: 0 % (ref 0.0–0.2)

## 2020-12-31 LAB — COMPREHENSIVE METABOLIC PANEL
ALT: 20 U/L (ref 0–44)
AST: 25 U/L (ref 15–41)
Albumin: 4 g/dL (ref 3.5–5.0)
Alkaline Phosphatase: 75 U/L (ref 38–126)
Anion gap: 10 (ref 5–15)
BUN: 18 mg/dL (ref 8–23)
CO2: 26 mmol/L (ref 22–32)
Calcium: 9 mg/dL (ref 8.9–10.3)
Chloride: 102 mmol/L (ref 98–111)
Creatinine, Ser: 1.05 mg/dL (ref 0.61–1.24)
GFR, Estimated: >60 mL/min (ref >60)
Glucose, Bld: 150 mg/dL — ABNORMAL HIGH (ref 70–99)
Potassium: 3.6 mmol/L (ref 3.5–5.1)
Sodium: 138 mmol/L (ref 135–145)
Total Bilirubin: 0.4 mg/dL (ref 0.3–1.2)
Total Protein: 7.1 g/dL (ref 6.5–8.1)

## 2020-12-31 LAB — TSH: TSH: 0.348 u[IU]/mL — ABNORMAL LOW (ref 0.350–4.500)

## 2020-12-31 MED ORDER — HEPARIN SOD (PORK) LOCK FLUSH 100 UNIT/ML IV SOLN
500.0000 [IU] | Freq: Once | INTRAVENOUS | Status: AC
  Administered 2020-12-31: 500 [IU] via INTRAVENOUS
  Filled 2020-12-31: qty 5

## 2020-12-31 MED ORDER — SODIUM CHLORIDE 0.9 % IV SOLN
10.0000 mg/kg | Freq: Once | INTRAVENOUS | Status: AC
  Administered 2020-12-31: 860 mg via INTRAVENOUS
  Filled 2020-12-31: qty 10

## 2020-12-31 MED ORDER — SODIUM CHLORIDE 0.9% FLUSH
10.0000 mL | Freq: Once | INTRAVENOUS | Status: AC
  Administered 2020-12-31: 10 mL via INTRAVENOUS
  Filled 2020-12-31: qty 10

## 2020-12-31 MED ORDER — HEPARIN SOD (PORK) LOCK FLUSH 100 UNIT/ML IV SOLN
INTRAVENOUS | Status: AC
  Filled 2020-12-31: qty 5

## 2020-12-31 MED ORDER — SODIUM CHLORIDE 0.9 % IV SOLN
Freq: Once | INTRAVENOUS | Status: AC
  Filled 2020-12-31: qty 250

## 2020-12-31 NOTE — Patient Instructions (Signed)
[facility]  Discharge Instructions: Thank you for choosing [facility] to provide your oncology and hematology care.  If you have a lab appointment with the [facility], please go directly to the [facility] and check in at the registration area.  Wear comfortable clothing and clothing appropriate for easy access to any Portacath or PICC line.   We strive to give you quality time with your provider. You may need to reschedule your appointment if you arrive late (15 or more minutes).  Arriving late affects you and other patients whose appointments are after yours.  Also, if you miss three or more appointments without notifying the office, you may be dismissed from the clinic at the provider's discretion.      For prescription refill requests, have your pharmacy contact our office and allow 72 hours for refills to be completed.    Today you received the following chemotherapy and/or immunotherapy agents Imfinzi       To help prevent nausea and vomiting after your treatment, we encourage you to take your nausea medication as directed.  BELOW ARE SYMPTOMS THAT SHOULD BE REPORTED IMMEDIATELY: *FEVER GREATER THAN 100.4 F (38 C) OR HIGHER *CHILLS OR SWEATING *NAUSEA AND VOMITING THAT IS NOT CONTROLLED WITH YOUR NAUSEA MEDICATION *UNUSUAL SHORTNESS OF BREATH *UNUSUAL BRUISING OR BLEEDING *URINARY PROBLEMS (pain or burning when urinating, or frequent urination) *BOWEL PROBLEMS (unusual diarrhea, constipation, pain near the anus) TENDERNESS IN MOUTH AND THROAT WITH OR WITHOUT PRESENCE OF ULCERS (sore throat, sores in mouth, or a toothache) UNUSUAL RASH, SWELLING OR PAIN  UNUSUAL VAGINAL DISCHARGE OR ITCHING   Items with * indicate a potential emergency and should be followed up as soon as possible or go to the Emergency Department if any problems should occur.  Please show the CHEMOTHERAPY ALERT CARD or IMMUNOTHERAPY ALERT CARD at check-in to  the Emergency Department and triage nurse.  Should you have questions after your visit or need to cancel or reschedule your appointment, please contact [facility]  [phone] and follow the prompts.  Office hours are 8:00 a.m. to 4:30 p.m. Monday - Friday. Please note that voicemails left after 4:00 p.m. may not be returned until the following business day.  We are closed weekends and major holidays. You have access to a nurse at all times for urgent questions. Please call the main number to the clinic [phone] and follow the prompts.  For any non-urgent questions, you may also contact your provider using MyChart. We now offer e-Visits for anyone 70 and older to request care online for non-urgent symptoms. For details visit [email or web address].   Also download the MyChart app! Go to the app store, search "MyChart", open the app, select [facility], and log in with your MyChart username and password.  Due to Covid, a mask is required upon entering the hospital/clinic. If you do not have a mask, one will be given to you upon arrival. For doctor visits, patients may have 1 support person aged 71 or older with them. For treatment visits, patients cannot have anyone with them due to current Covid guidelines and our immunocompromised population.

## 2020-12-31 NOTE — Progress Notes (Signed)
Patient here for follow up and new treatment of Durvalumab. No new concerns voiced.

## 2020-12-31 NOTE — Progress Notes (Signed)
Hematology/Oncology  note [facility] [phone] [phone]   Patient Care Team: [name], MD as PCP - General (Physician Assistant) [name], MD as Referring Physician (Physician Assistant) Bary Castilla, [name], MD (General Surgery) Telford Nab, RN as Oncology Nurse Navigator  REFERRING PROVIDER: [name], MD  CHIEF COMPLAINTS/REASON FOR VISIT:  Follow up for lung cancer  HISTORY OF PRESENTING ILLNESS:   Joshua [name] is a  65 y.o.  male with PMH listed below was seen in consultation at the request of  [name], MD  for evaluation of lung mass  Patient presented to emergency room on 08/25/2020 for evaluation of hemoptysis, coughing.  He coughed up small amount of blood and a large clot.  No fever, chills, chest pain, abdominal pain.  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  Patient is on Plavix and Eliquis.  Since his ER visit, he has not coughed up more blood.  08/25/2020 CT angio chest PE with and without contrast showed ill-defined soft tissue adjacent to the right upper lobe bronchus resulting in narrowing of right upper lobe bronchus.  Also right paratracheal mediastinal adenopathy.  Scattered centrilobular groundglass opacities within the right lower lobe and to a lesser extent within the right lower lobe likely reflecting an infectious or inflammatory process. # 09/14/2020, right upper lobe lung biopsy and bronchoscopy brushing are both positive for squamous cell carcinoma.Right precarinal space lymph node insufficient lymph node sampling for staging. 09/18/2020, PET scan showed right upper lobe nodule maximal SUV 11.5.  Hypermetabolic cluster right paratracheal adenopathy with maximum SUV up to 12.  No findings of metastatic disease to the neck/abdomen/pelvis or  skeleton.  # 10/03/2000, MRI brain with and without contrast showed no evidence of intracranial metastatic disease. 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  # - NGS showed PD-L1 TPS 95%, LRP1B S98f, PIK3CA E545K, RB1 c1422-2A>T, RIT1 T83_ A84del, STK11 P2872f TPS53 R248W, TMB 19.12m14mmb, MS stable.   #10/10/2020 started concurrent chemoradiation.  Patient received 4 cycles of carboplatin AUC of 2 and Taxol 45 mg per metered square  Additional chemotherapy was held due to thrombocytopenia, neutropenia Patient finished radiation on 11/27/2020 INTERVAL HISTORY Joshua [name] a 64 7o. male who has above history reviewed by me today presents for follow up visit for Stage III lung squamous cell carcinoma. Patient is status post concurrent chemoradiation.  Patient has had CT scan done during interval.  Present to discuss results and assessment prior to immunotherapy maintenance.  He has no new complaints. Review of Systems  Constitutional:  Negative for appetite change, chills, fatigue, fever and unexpected weight change.  HENT:   Negative for hearing loss and voice change.   Eyes:  Negative for eye problems and icterus.  Respiratory:  Negative for chest tightness, cough, hemoptysis and shortness of breath.   Cardiovascular:  Negative for chest pain and leg swelling.  Gastrointestinal:  Negative for abdominal distention and abdominal pain.  Endocrine: Negative for hot flashes.  Genitourinary:  Negative for difficulty urinating, dysuria and frequency.   Musculoskeletal:  Negative for arthralgias.  Skin:  Negative for itching and rash.  Neurological:  Negative for light-headedness and numbness.  Hematological:  Negative for adenopathy. Does not bruise/bleed easily.  Psychiatric/Behavioral:  Negative for confusion.    MEDICAL HISTORY:  Past Medical History:  Diagnosis Date   Allergy     Dyspnea    former smoker. only doe   History of kidney stones    Hypertension    Peripheral vascular disease ([place])    Pre-diabetes    Sleep apnea     SURGICAL HISTORY: Past Surgical History:  Procedure Laterality Date   ANTERIOR CERVICAL DECOMP/DISCECTOMY FUSION N/A 03/14/2020   Procedure: ANTERIOR CERVICAL DECOMPRESSION/DISCECTOMY FUSION 3 LEVELS C4-7;  Surgeon: Meade Maw, MD;  Location: [facility];  Service: Neurosurgery;  Laterality: N/A;   BACK SURGERY  2011   CENTRAL LINE INSERTION Right 09/10/2016   Procedure: CENTRAL LINE INSERTION;  Surgeon: Katha Cabal, MD;  Location: [facility];  Service: Vascular;  Laterality: Right;   CERVICAL FUSION     C4-c7 on Mar 14, 2020   COLONOSCOPY  2013 ?   CORONARY ANGIOPLASTY     FEMORAL-POPLITEAL BYPASS GRAFT Right 09/10/2016   Procedure: BYPASS GRAFT FEMORAL-POPLITEAL ARTERY ( BELOW KNEE );  Surgeon: Katha Cabal, MD;  Location: [facility];  Service: Vascular;  Laterality: Right;   FEMORAL-TIBIAL BYPASS GRAFT Right 03/23/2020   Procedure: BYPASS GRAFT RIGHT FEMORAL- DISTAL TIBIAL ARTERY WITH DISTAL FLOW GRAFT;  Surgeon: Katha Cabal, MD;  Location: [facility];  Service: Vascular;  Laterality: Right;   LOWER EXTREMITY ANGIOGRAPHY Right 11/18/2016   Procedure: Lower Extremity Angiography;  Surgeon: Katha Cabal, MD;  Location: [place] [facility];  Service: Cardiovascular;  Laterality: Right;   LOWER EXTREMITY ANGIOGRAPHY Right 12/09/2016   Procedure: Lower Extremity Angiography;  Surgeon: Katha Cabal, MD;  Location: [place] [facility];  Service: Cardiovascular;  Laterality: Right;   LOWER EXTREMITY ANGIOGRAPHY Right 11/15/2019   Procedure: LOWER EXTREMITY ANGIOGRAPHY;  Surgeon: Katha Cabal, MD;  Location: [place] [facility];  Service: Cardiovascular;  Laterality: Right;   LOWER EXTREMITY ANGIOGRAPHY Right 12/01/2019   Procedure: Lower Extremity Angiography;  Surgeon: Algernon Huxley, MD;  Location: [place]  [facility];  Service: Cardiovascular;  Laterality: Right;   LOWER EXTREMITY ANGIOGRAPHY Right 12/02/2019   Procedure: LOWER EXTREMITY ANGIOGRAPHY;  Surgeon: Katha Cabal, MD;  Location: [place] [facility];  Service: Cardiovascular;  Laterality: Right;   LOWER EXTREMITY ANGIOGRAPHY Right 03/23/2020   Procedure: Lower Extremity Angiography;  Surgeon: Katha Cabal, MD;  Location: [place] [facility];  Service: Cardiovascular;  Laterality: Right;   LOWER EXTREMITY INTERVENTION  11/18/2016   Procedure: Lower Extremity Intervention;  Surgeon: Katha Cabal, MD;  Location: [place] [facility];  Service: Cardiovascular;;   PERIPHERAL VASCULAR CATHETERIZATION Right 01/02/2015   Procedure: Lower Extremity Angiography;  Surgeon: Katha Cabal, MD;  Location: [place] [facility];  Service: Cardiovascular;  Laterality: Right;   PERIPHERAL VASCULAR CATHETERIZATION Right 06/18/2015   Procedure: Lower Extremity Angiography;  Surgeon: Algernon Huxley, MD;  Location: [place] [facility];  Service: Cardiovascular;  Laterality: Right;   PERIPHERAL VASCULAR CATHETERIZATION  06/18/2015   Procedure: Lower Extremity Intervention;  Surgeon: Algernon Huxley, MD;  Location: [place] [facility];  Service: Cardiovascular;;   PERIPHERAL VASCULAR CATHETERIZATION N/A 10/09/2015   Procedure: Abdominal Aortogram w/Lower Extremity;  Surgeon: Katha Cabal, MD;  Location: [place] [facility];  Service: Cardiovascular;  Laterality: N/A;   PERIPHERAL VASCULAR CATHETERIZATION  10/09/2015   Procedure: Lower Extremity Intervention;  Surgeon: Katha Cabal, MD;  Location: [place] [facility];  Service: Cardiovascular;;   PERIPHERAL VASCULAR CATHETERIZATION Right 10/10/2015   Procedure: Lower  Extremity Angiography;  Surgeon: Algernon Huxley, MD;  Location: [place] [facility];  Service: Cardiovascular;  Laterality: Right;   PERIPHERAL VASCULAR CATHETERIZATION Right 10/10/2015   Procedure: Lower Extremity Intervention;   Surgeon: Algernon Huxley, MD;  Location: [place] [facility];  Service: Cardiovascular;  Laterality: Right;   PERIPHERAL VASCULAR CATHETERIZATION Right 12/03/2015   Procedure: Lower Extremity Angiography;  Surgeon: Algernon Huxley, MD;  Location: [place] [facility];  Service: Cardiovascular;  Laterality: Right;   PERIPHERAL VASCULAR CATHETERIZATION Right 12/04/2015   Procedure: Lower Extremity Angiography;  Surgeon: Algernon Huxley, MD;  Location: [place] [facility];  Service: Cardiovascular;  Laterality: Right;   PERIPHERAL VASCULAR CATHETERIZATION  12/04/2015   Procedure: Lower Extremity Intervention;  Surgeon: Algernon Huxley, MD;  Location: [place] [facility];  Service: Cardiovascular;;   PERIPHERAL VASCULAR CATHETERIZATION Right 06/30/2016   Procedure: Lower Extremity Angiography;  Surgeon: Algernon Huxley, MD;  Location: [place] [facility];  Service: Cardiovascular;  Laterality: Right;   PERIPHERAL VASCULAR CATHETERIZATION Right 06/25/2016   Procedure: Lower Extremity Angiography;  Surgeon: Katha Cabal, MD;  Location: [place] [facility];  Service: Cardiovascular;  Laterality: Right;   PERIPHERAL VASCULAR CATHETERIZATION Right 07/01/2016   Procedure: Lower Extremity Angiography;  Surgeon: Katha Cabal, MD;  Location: [place] [facility];  Service: Cardiovascular;  Laterality: Right;   PERIPHERAL VASCULAR CATHETERIZATION  07/01/2016   Procedure: Lower Extremity Intervention;  Surgeon: Katha Cabal, MD;  Location: [place] [facility];  Service: Cardiovascular;;   PERIPHERAL VASCULAR CATHETERIZATION Right 08/01/2016   Procedure: Lower Extremity Angiography;  Surgeon: Katha Cabal, MD;  Location: [place] [facility];  Service: Cardiovascular;  Laterality: Right;   PORTA CATH INSERTION N/A 10/05/2020   Procedure: PORTA CATH INSERTION;  Surgeon: Katha Cabal, MD;  Location: [place] [facility];  Service: Cardiovascular;  Laterality: N/A;   stent placement in right leg Right     VIDEO BRONCHOSCOPY N/A 09/14/2020   Procedure: VIDEO BRONCHOSCOPY WITH FLUORO;  Surgeon: Tyler Pita, MD;  Location: [facility];  Service: Cardiopulmonary;  Laterality: N/A;   VIDEO BRONCHOSCOPY WITH ENDOBRONCHIAL ULTRASOUND N/A 09/14/2020   Procedure: VIDEO BRONCHOSCOPY WITH ENDOBRONCHIAL ULTRASOUND;  Surgeon: Tyler Pita, MD;  Location: [facility];  Service: Cardiopulmonary;  Laterality: N/A;    SOCIAL HISTORY: Social History   Socioeconomic History   Marital status: Single    Spouse name: Not on file   Number of children: Not on file   Years of education: Not on file   Highest education level: Not on file  Occupational History   Occupation: unemployed  Tobacco Use   Smoking status: Former    Packs/day: 2.00    Years: 38.00    Pack years: 76.00    Types: Cigarettes    Quit date: 12/20/2014    Years since quitting: 6.0   Smokeless tobacco: Former   Tobacco comments:    quit smoking in 2017 most smoked 2   Vaping Use   Vaping Use: Never used  Substance and Sexual Activity   Alcohol use: Not Currently   Drug use: No   Sexual activity: Yes  Other Topics Concern   Not on file  Social History Narrative   Not on file   Social Determinants of Health   Financial Resource Strain: Not on file  Food Insecurity: Not on file  Transportation Needs: Not on file  Physical Activity: Not on file  Stress: Not on file  Social Connections: Not on file  Intimate Partner Violence: Not on file    FAMILY HISTORY: Family History  Problem Relation Age of Onset   Diabetes Mother    Hypertension Mother    Heart murmur Mother    Leukemia Mother    Throat cancer Maternal Grandmother    Colon cancer Neg Hx    Breast cancer Neg Hx     ALLERGIES:  is allergic to tape.  MEDICATIONS:  Current Outpatient Medications  Medication Sig Dispense Refill   acetaminophen (TYLENOL) 325 MG tablet Take 650 mg by mouth every 6 (six) hours as needed for moderate pain.     clopidogrel  (PLAVIX) 75 MG tablet Take 1 tablet (75 mg total) by mouth daily at 6 (six) AM. 60 tablet 1   ELIQUIS 5 MG TABS tablet TAKE 1 TABLET BY MOUTH  TWICE DAILY 180 tablet 3   gabapentin (NEURONTIN) 300 MG capsule Take 2 capsules (600 mg total) by mouth 3 (three) times daily. (Patient taking differently: Take 900 mg by mouth at bedtime as needed (pain).) 540 capsule 1   lidocaine-prilocaine (EMLA) cream Apply to affected area once 30 g 3   lisinopril (ZESTRIL) 10 MG tablet TAKE 1 TABLET BY MOUTH  DAILY 90 tablet 0   Multiple Vitamin (MULTIVITAMIN) tablet Take 1 tablet by mouth daily.     simvastatin (ZOCOR) 40 MG tablet TAKE 1 TABLET BY MOUTH AT  BEDTIME 90 tablet 0   No current facility-administered medications for this visit.     PHYSICAL EXAMINATION: ECOG PERFORMANCE STATUS: 1 - Symptomatic but completely ambulatory Vitals:   12/31/20 0844  BP: 124/73  Pulse: 97  Resp: 18  Temp: 98.7 F (37.1 C)  SpO2: 98%   Filed Weights   12/31/20 0844  Weight: 200 lb 14.4 oz (91.1 kg)    Physical Exam Constitutional:      General: He is not in acute distress.    Comments: Walks with a cane.   HENT:     Head: Normocephalic and atraumatic.  Eyes:     General: No scleral icterus. Cardiovascular:     Rate and Rhythm: Normal rate and regular rhythm.     Heart sounds: Normal heart sounds.  Pulmonary:     Effort: Pulmonary effort is normal. No respiratory distress.     Breath sounds: No wheezing.  Abdominal:     General: Bowel sounds are normal. There is no distension.     Palpations: Abdomen is soft.  Musculoskeletal:        General: No deformity. Normal range of motion.     Cervical back: Normal range of motion and neck supple.  Skin:    General: Skin is warm and dry.     Findings: No erythema or rash.  Neurological:     Mental Status: He is alert and oriented to person, place, and time. Mental status is at baseline.     Cranial Nerves: No cranial nerve deficit.     Coordination:  Coordination normal.  Psychiatric:        Mood and Affect: Mood normal.    LABORATORY DATA:  I have reviewed the data as listed Lab Results  Component Value Date   WBC 5.5 12/31/2020   HGB 11.5 (L) 12/31/2020   HCT 35.6 (L) 12/31/2020   MCV 87.7 12/31/2020   PLT 165 12/31/2020   Recent Labs    03/28/20 0421 03/29/20 0444 03/30/20 0536 04/16/20 1525 08/25/20 1512 11/14/20 0805 11/21/20 1032 12/25/20 1240 12/31/20  0817  NA 133* 139 140  --    < > 138 139  --  138  K 4.1 4.0 4.1  --    < > 3.8 3.8  --  3.6  CL 101 103 102  --    < > 104 104  --  102  CO2 _0 --    < > 23 25  --  26  GLUCOSE 116* 101* 99  --    < > 132* 124*  --  150*  BUN _1 --    < > 19 13  --  18  CREATININE 0.76 0.85 0.76  --    < > 1.00 0.88 1.00 1.05  CALCIUM 8.2* 8.4* 8.6*  --    < > 8.8* 9.2  --  9.0  GFRNONAA >60 >60 >60  --    < > >60 >60  --  >60  GFRAA >60 >60 >60  --   --   --   --   --   --   PROT  --   --   --  7.0   < > 6.7 7.5  --  7.1  ALBUMIN  --   --   --  4.3   < > 3.5 3.9  --  4.0  AST  --   --   --  17   < > 25 29  --  25  ALT  --   --   --  19   < > 23 30  --  20  ALKPHOS  --   --   --  123*   < > 69 82  --  75  BILITOT  --   --   --  0.4   < > 0.5 0.4  --  0.4  BILIDIR  --   --   --  0.10  --   --   --   --   --    < > = values in this interval not displayed.    Iron/TIBC/Ferritin/ %Sat    Component Value Date/Time   IRON 48 08/30/2020 1052   TIBC 391 08/30/2020 1052   FERRITIN 25 08/30/2020 1052   IRONPCTSAT 12 (L) 08/30/2020 1052      RADIOGRAPHIC STUDIES: I have personally reviewed the radiological images as listed and agreed with the findings in the report. CT Chest W Contrast  Result Date: 12/26/2020 CLINICAL DATA:  Restaging lung cancer. EXAM: CT CHEST WITH CONTRAST TECHNIQUE: Multidetector CT imaging of the chest was performed during intravenous contrast administration. CONTRAST:  26m OMNIPAQUE IOHEXOL 300 MG/ML  SOLN COMPARISON:  PET-CT 09/18/2020  FINDINGS: Cardiovascular: The heart size appears within normal limits. No pericardial effusion. Coronary artery calcifications noted. Mediastinum/Nodes: Normal appearance of the thyroid gland. The trachea appears patent and is midline. Normal appearance of the esophagus. High right paratracheal lymph node measures 1 cm, image 38/2. Previously 1.6 cm. Low right paratracheal lymph node measures 1 cm, image 66/2. Unchanged from previous exam. Lungs/Pleura: No pleural effusion. No airspace consolidation, atelectasis, or pneumothorax. Paraseptal emphysema. Right hilar mass/adenopathy measures 2.1 x 1.3 cm, image 72/2. Previously 2.3 x 2.5 cm. No new pulmonary nodule or mass. Upper Abdomen: No acute abnormality. Musculoskeletal: No chest wall abnormality. No acute or significant osseous findings. IMPRESSION: 1. Interval response to therapy. The right hilar mass/adenopathy has decreased in size in the interval. 2. Stable to decreased size of right paratracheal lymph node. 3. Coronary  artery calcifications. 4. Emphysema and aortic atherosclerosis. Aortic Atherosclerosis (ICD10-I70.0) and Emphysema (ICD10-J43.9). Electronically Signed   By: Kerby Moors M.D.   On: 12/26/2020 09:21       ASSESSMENT & PLAN:  1. Squamous cell carcinoma of right lung ([place])   2. Encounter for antineoplastic immunotherapy   3. Antineoplastic chemotherapy induced anemia   Cancer Staging Squamous cell carcinoma of right lung (HCC) Staging form: Lung, AJCC 8th Edition - Clinical stage from 09/20/2020: Stage IIIA (cT1b, cN2, cM0) - Signed by [name], MD on 09/20/2020   #Squamous cell carcinoma of right lung, stage IIIA, Post concurrent chemotherapy radiation CT scan shows treatment response. Recommend 1 year maintenance of Durvalumab every 2 weeks. Rationale and potential side effects of immunotherapy maintenance were discussed with patient.  He agrees with the plan. Labs reviewed and discussed with patient.  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  Management per Dr. Tami Ribas #Thrombocytopenia, resolved.  Continue both Eliquis and Plavix. #Anemia, likely due to chemotherapy and radiation.  hemoglobin has improved.  All questions were answered. The patient knows to call the clinic with any problems questions or concerns.  cc [name], MD    Return of visit: 2 weeks [name], MD, PhD Hematology Oncology [facility] at [facility] Pager- [phone] 12/31/2020

## 2021-01-01 LAB — T4: T4, Total: 15.1 ug/dL — ABNORMAL HIGH (ref 4.5–12.0)

## 2021-01-04 ENCOUNTER — Ambulatory Visit: Payer: Medicare Other | Admitting: Radiation Oncology

## 2021-01-10 ENCOUNTER — Ambulatory Visit
Admission: RE | Admit: 2021-01-10 | Discharge: 2021-01-10 | Disposition: A | Discharge status: Home / Self Care | Payer: Medicare Other | Source: Ambulatory Visit | Attending: Radiation Oncology | Admitting: Radiation Oncology

## 2021-01-10 ENCOUNTER — Encounter: Payer: Self-pay | Admitting: Radiation Oncology

## 2021-01-10 VITALS — BP 139/75 | HR 108 | Temp 98.4°F | Resp 20 | Wt 195.0 lb

## 2021-01-10 DIAGNOSIS — C3411 Malignant neoplasm of upper lobe, right bronchus or lung: Secondary | ICD-10-CM | POA: Insufficient documentation

## 2021-01-10 DIAGNOSIS — Z87891 Personal history of nicotine dependence: Secondary | ICD-10-CM | POA: Insufficient documentation

## 2021-01-10 DIAGNOSIS — Z9221 Personal history of antineoplastic chemotherapy: Secondary | ICD-10-CM | POA: Insufficient documentation

## 2021-01-10 DIAGNOSIS — R000 Tachycardia, unspecified: Secondary | ICD-10-CM | POA: Diagnosis not present

## 2021-01-10 DIAGNOSIS — Z923 Personal history of irradiation: Secondary | ICD-10-CM | POA: Insufficient documentation

## 2021-01-10 DIAGNOSIS — C3491 Malignant neoplasm of unspecified part of right bronchus or lung: Secondary | ICD-10-CM

## 2021-01-10 NOTE — Progress Notes (Signed)
Radiation Oncology Follow up Note  Name: Joshua [name]   Date:   01/10/2021 MRN:  [id number] DOB: 04/20/1956    This 65 y.o. male presents to the clinic today for 1 month follow-up status post concurrent chemoradiation therapy for stage IIIa squamous cell carcinoma of the right lung.  REFERRING PROVIDER: [name], MD  HPI: Patient is a 65 year old male now at 1 month having completed concurrent chemoradiation for stage IIIa squamous cell carcinoma of the right lung.Marland Kitchen  He is currently under immunotherapy with Durvalumab every 2 weeks.  Has been tolerating that well although he is complaining this week of some tachycardia over 100 bpm.  I have asked him to alert Dr. Collie Siad team of that for possible work-up.  He specifically denies cough hemoptysis or chest tightness.  He is feeling quite fatigued.  He had a recent CT scan of his chest showing interval response to therapy with a right hilar mass and adenopathy decreased in size.  Paratracheal lymph nodes are also decreased in size.  COMPLICATIONS OF TREATMENT: none  FOLLOW UP COMPLIANCE: keeps appointments   PHYSICAL EXAM:  BP 139/75 (BP Location: Left Arm, Patient Position: Sitting)   Pulse (!) 108   Temp 98.4 F (36.9 C) (Tympanic)   Resp 20   Wt 195 lb (88.5 kg)   BMI 27.98 kg/m  Well-developed well-nourished patient in NAD. HEENT reveals PERLA, EOMI, discs not visualized.  Oral cavity is clear. No oral mucosal lesions are identified. Neck is clear without evidence of cervical or supraclavicular adenopathy. Lungs are clear to A&P. Cardiac examination is essentially unremarkable with regular rate and rhythm without murmur rub or thrill. Abdomen is benign with no organomegaly or masses noted. Motor sensory and DTR levels are equal and symmetric in the upper and lower extremities. Cranial nerves II through XII are grossly intact. Proprioception is intact. No peripheral adenopathy or edema is identified. No motor or sensory  levels are noted. Crude visual fields are within normal range.  RADIOLOGY RESULTS: CT scan reviewed compatible with above-stated findings  PLAN: Present time patient is doing well currently on immunotherapy under medical oncology's direction.  I have asked him to alert medical oncology about his tachycardia.  I have otherwise asked to see him out in 3 to 4 months for follow-up.  Patient knows to call with any concerns.  I would like to take this opportunity to thank you for allowing me to participate in the care of your patient.Noreene Filbert, MD

## 2021-01-11 ENCOUNTER — Telehealth: Payer: Self-pay

## 2021-01-11 NOTE — Telephone Encounter (Signed)
Called patient and he is feeling better today.  His resting HR on home checks staying around 90.  He thinks he was feeling nauseated while here yesterday due to eating a piece of pizza in a rush prior to appt.  Advised him to stay hydrated with plenty of rest this weekend and Dr. Tasia Catchings will f/u with him at his appt on Monday.

## 2021-01-11 NOTE — Telephone Encounter (Signed)
-----   Message from Vanice Sarah, [place] sent at 01/11/2021  2:43 PM EDT ----- Regarding: FW: nausea/tachycardia We do not have [facility] today and Sonia Baller is leaving early ----- Message ----- From: [name], MD Sent: 01/11/2021   2:06 PM EDT To: Evelina Dun, RN, Vanice Sarah, CMA, # Subject: RE: nausea/tachycardia                         Please offer him symptom management with NP today.   ----- Message ----- From: Daiva Huge, RN Sent: 01/10/2021  11:20 AM EDT To: Evelina Dun, RN, Vanice Sarah, CMA, # Subject: nausea/tachycardia                             Mr. Lazarz was here for a follow up today.  His heartrate was 108, I rechecked it before he left and it jumped up to 120.  He said it has been high at home as well.  He is also not feeling great and has been nauseated.   He was last seen on 6/13 when he received durvalumab.  He states he has been drinking well and doesn't feel that he is dehydrated.  I just wanted to make you aware.  I told him you would call if he needed to come in sooner then Monday or if you have any recommendations.     Thanks,   [facility]

## 2021-01-14 ENCOUNTER — Encounter: Payer: Self-pay | Admitting: Emergency Medicine

## 2021-01-14 ENCOUNTER — Other Ambulatory Visit: Payer: Medicare Other

## 2021-01-14 ENCOUNTER — Emergency Department: Payer: Medicare Other

## 2021-01-14 ENCOUNTER — Inpatient Hospital Stay (HOSPITAL_BASED_OUTPATIENT_CLINIC_OR_DEPARTMENT_OTHER): Payer: Medicare Other | Admitting: Oncology

## 2021-01-14 ENCOUNTER — Other Ambulatory Visit: Payer: Self-pay

## 2021-01-14 ENCOUNTER — Emergency Department
Admission: EM | Admit: 2021-01-14 | Discharge: 2021-01-14 | Disposition: A | Discharge status: Home / Self Care | Payer: Medicare Other | Source: Home / Self Care | Attending: Emergency Medicine | Admitting: Emergency Medicine

## 2021-01-14 ENCOUNTER — Encounter: Payer: Self-pay | Admitting: Oncology

## 2021-01-14 ENCOUNTER — Inpatient Hospital Stay: Payer: Medicare Other

## 2021-01-14 VITALS — BP 114/68 | HR 117 | Temp 96.2°F | Resp 18 | Wt 192.7 lb

## 2021-01-14 DIAGNOSIS — R7989 Other specified abnormal findings of blood chemistry: Secondary | ICD-10-CM

## 2021-01-14 DIAGNOSIS — Z5112 Encounter for antineoplastic immunotherapy: Secondary | ICD-10-CM

## 2021-01-14 DIAGNOSIS — Z7902 Long term (current) use of antithrombotics/antiplatelets: Secondary | ICD-10-CM | POA: Insufficient documentation

## 2021-01-14 DIAGNOSIS — R079 Chest pain, unspecified: Secondary | ICD-10-CM

## 2021-01-14 DIAGNOSIS — Z95828 Presence of other vascular implants and grafts: Secondary | ICD-10-CM | POA: Diagnosis not present

## 2021-01-14 DIAGNOSIS — Z87891 Personal history of nicotine dependence: Secondary | ICD-10-CM | POA: Insufficient documentation

## 2021-01-14 DIAGNOSIS — E860 Dehydration: Secondary | ICD-10-CM

## 2021-01-14 DIAGNOSIS — Z85118 Personal history of other malignant neoplasm of bronchus and lung: Secondary | ICD-10-CM | POA: Insufficient documentation

## 2021-01-14 DIAGNOSIS — C3491 Malignant neoplasm of unspecified part of right bronchus or lung: Secondary | ICD-10-CM

## 2021-01-14 DIAGNOSIS — R000 Tachycardia, unspecified: Secondary | ICD-10-CM

## 2021-01-14 DIAGNOSIS — Z79899 Other long term (current) drug therapy: Secondary | ICD-10-CM | POA: Insufficient documentation

## 2021-01-14 DIAGNOSIS — R0789 Other chest pain: Secondary | ICD-10-CM | POA: Insufficient documentation

## 2021-01-14 DIAGNOSIS — C801 Malignant (primary) neoplasm, unspecified: Secondary | ICD-10-CM | POA: Insufficient documentation

## 2021-01-14 DIAGNOSIS — I10 Essential (primary) hypertension: Secondary | ICD-10-CM | POA: Insufficient documentation

## 2021-01-14 DIAGNOSIS — Z955 Presence of coronary angioplasty implant and graft: Secondary | ICD-10-CM | POA: Insufficient documentation

## 2021-01-14 DIAGNOSIS — Z7901 Long term (current) use of anticoagulants: Secondary | ICD-10-CM | POA: Insufficient documentation

## 2021-01-14 DIAGNOSIS — Z20822 Contact with and (suspected) exposure to covid-19: Secondary | ICD-10-CM | POA: Insufficient documentation

## 2021-01-14 HISTORY — DX: Malignant (primary) neoplasm, unspecified: C80.1

## 2021-01-14 LAB — CBC WITH DIFFERENTIAL/PLATELET
Abs Immature Granulocytes: 0.01 10*3/uL (ref 0.00–0.07)
Basophils Absolute: 0.1 10*3/uL (ref 0.0–0.1)
Basophils Relative: 1 %
Eosinophils Absolute: 0.2 10*3/uL (ref 0.0–0.5)
Eosinophils Relative: 5 %
HCT: 36.1 % — ABNORMAL LOW (ref 39.0–52.0)
Hemoglobin: 11.8 g/dL — ABNORMAL LOW (ref 13.0–17.0)
Immature Granulocytes: 0 %
Lymphocytes Relative: 14 %
Lymphs Abs: 0.7 10*3/uL (ref 0.7–4.0)
MCH: 28.4 pg (ref 26.0–34.0)
MCHC: 32.7 g/dL (ref 30.0–36.0)
MCV: 86.8 fL (ref 80.0–100.0)
Monocytes Absolute: 0.7 10*3/uL (ref 0.1–1.0)
Monocytes Relative: 14 %
Neutro Abs: 3.4 10*3/uL (ref 1.7–7.7)
Neutrophils Relative %: 66 %
Platelets: 193 10*3/uL (ref 150–400)
RBC: 4.16 MIL/uL — ABNORMAL LOW (ref 4.22–5.81)
RDW: 15.4 % (ref 11.5–15.5)
WBC: 5.1 10*3/uL (ref 4.0–10.5)
nRBC: 0 % (ref 0.0–0.2)

## 2021-01-14 LAB — COMPREHENSIVE METABOLIC PANEL
ALT: 40 U/L (ref 0–44)
AST: 36 U/L (ref 15–41)
Albumin: 3.9 g/dL (ref 3.5–5.0)
Alkaline Phosphatase: 92 U/L (ref 38–126)
Anion gap: 11 (ref 5–15)
BUN: 20 mg/dL (ref 8–23)
CO2: 26 mmol/L (ref 22–32)
Calcium: 9.3 mg/dL (ref 8.9–10.3)
Chloride: 98 mmol/L (ref 98–111)
Creatinine, Ser: 1.04 mg/dL (ref 0.61–1.24)
GFR, Estimated: >60 mL/min (ref >60)
Glucose, Bld: 157 mg/dL — ABNORMAL HIGH (ref 70–99)
Potassium: 4 mmol/L (ref 3.5–5.1)
Sodium: 135 mmol/L (ref 135–145)
Total Bilirubin: 0.7 mg/dL (ref 0.3–1.2)
Total Protein: 7.2 g/dL (ref 6.5–8.1)

## 2021-01-14 LAB — RESP PANEL BY RT-PCR (FLU A&B, COVID) ARPGX2
Influenza A by PCR: NEGATIVE
Influenza B by PCR: NEGATIVE
SARS Coronavirus 2 by RT PCR: NEGATIVE

## 2021-01-14 MED ORDER — SODIUM CHLORIDE 0.9% FLUSH
10.0000 mL | Freq: Once | INTRAVENOUS | Status: AC
Start: 2021-01-14 — End: 2021-01-14
  Administered 2021-01-14: 10 mL via INTRAVENOUS
  Filled 2021-01-14: qty 10

## 2021-01-14 MED ORDER — LACTATED RINGERS IV BOLUS
1000.0000 mL | Freq: Once | INTRAVENOUS | Status: AC
  Administered 2021-01-14: 1000 mL via INTRAVENOUS

## 2021-01-14 MED ORDER — HEPARIN SOD (PORK) LOCK FLUSH 100 UNIT/ML IV SOLN
500.0000 [IU] | Freq: Once | INTRAVENOUS | Status: AC
  Administered 2021-01-14: 500 [IU] via INTRAVENOUS
  Filled 2021-01-14: qty 5

## 2021-01-14 MED ORDER — HEPARIN SOD (PORK) LOCK FLUSH 100 UNIT/ML IV SOLN
INTRAVENOUS | Status: AC
  Administered 2021-01-14: 500 [IU]
  Filled 2021-01-14: qty 5

## 2021-01-14 NOTE — ED Notes (Signed)
See triage note, pt to ED from cancer center, states he was there for treatment but they wouldn't see him d/t high HR. Pt denies CP, shob. Pt in NAD, alert and oriented. RR even and unlabored.

## 2021-01-14 NOTE — ED Triage Notes (Addendum)
Arrives from [facility] for evaluation of chest pain. Patient denies chest pain, stating that he was sent here for a high heart rate.  AAOx3.  Skin warm and dry. NAD  Patient had blood drawn in [place] this morning.  Results available.

## 2021-01-14 NOTE — ED Notes (Signed)
Discharge instructions reviewed with pt. Pt calm , collective, denied pain or sob  

## 2021-01-14 NOTE — ED Provider Notes (Signed)
[facility] Emergency Department Provider Note  ____________________________________________  Time seen: Approximately 1:47 PM  I have reviewed the triage vital signs and the nursing notes.   HISTORY  Chief Complaint Tachycardia    HPI Joshua [name] is a 65 y.o. male with a history of lung cancer status post radiation therapy, on chemotherapy plan per oncology who is brought to the ED due to chest pain.  Patient reports that he has been having intermittent episodes of pain over the past few days that are sharp, stabbing, lasts for just a second at a time and then disappear.  Nonradiating, no aggravating or alleviating factors.  No associated shortness of breath vomiting or diaphoresis.  Not exertional, not pleuritic.  Has been compliant with his medications, eating and drinking okay but does feel like he is not getting enough fluids especially with hot weather lately.    Past Medical History:  Diagnosis Date   Allergy    Cancer ([place])    lung   Dyspnea    former smoker. 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   Goals of care, counseling/discussion 08/30/2020   Iron deficiency anemia 08/30/2020   Lung mass 08/30/2020   Ischemia of extremity 03/23/2020   Cervical myelopathy ([place]) 03/14/2020   Ischemia of right lower extremity 11/15/2019   Peripheral neuropathy 10/14/2019   Chronic left-sided low back pain without sciatica 04/13/2019   History of back surgery 04/13/2019   Overweight 04/13/2019   Atherosclerosis of lower extremity with claudication ([place]) 26/33/3545   Complication associated with vascular device 11/13/2016   Hyperlipidemia 06/18/2016   Hypertension  06/18/2016   Radiculopathy of lumbar region 06/10/2016   PVD (peripheral vascular disease) ([place]) 05/08/2016   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   CORONARY ANGIOPLASTY     FEMORAL-POPLITEAL BYPASS GRAFT Right 09/10/2016   Procedure: BYPASS GRAFT FEMORAL-POPLITEAL ARTERY ( BELOW KNEE );  Surgeon: Katha Cabal, MD;  Location: [facility] ORS;  Service: Vascular;  Laterality: Right;   FEMORAL-TIBIAL BYPASS GRAFT Right 03/23/2020   Procedure: BYPASS GRAFT RIGHT FEMORAL- DISTAL TIBIAL ARTERY WITH DISTAL FLOW GRAFT;  Surgeon: Katha Cabal, MD;  Location: [facility];  Service: Vascular;  Laterality: Right;   LOWER EXTREMITY ANGIOGRAPHY Right 11/18/2016   Procedure: Lower Extremity Angiography;  Surgeon: Katha Cabal, MD;  Location: [place] [facility];  Service: Cardiovascular;  Laterality: Right;   LOWER EXTREMITY ANGIOGRAPHY Right 12/09/2016   Procedure: Lower Extremity Angiography;  Surgeon: [name]  G, MD;  Location: [place] [facility];  Service: Cardiovascular;  Laterality: Right;   LOWER EXTREMITY ANGIOGRAPHY Right 11/15/2019   Procedure: LOWER EXTREMITY ANGIOGRAPHY;  Surgeon: Katha Cabal, MD;  Location: [place] [facility];  Service: Cardiovascular;  Laterality: Right;   LOWER EXTREMITY ANGIOGRAPHY Right  12/01/2019   Procedure: Lower Extremity Angiography;  Surgeon: Algernon Huxley, MD;  Location: [place] [facility];  Service: Cardiovascular;  Laterality: Right;   LOWER EXTREMITY ANGIOGRAPHY Right 12/02/2019   Procedure: LOWER EXTREMITY ANGIOGRAPHY;  Surgeon: Katha Cabal, MD;  Location: [place] [facility];  Service: Cardiovascular;  Laterality: Right;   LOWER EXTREMITY ANGIOGRAPHY Right 03/23/2020   Procedure: Lower Extremity Angiography;  Surgeon: Katha Cabal, MD;  Location: [place] [facility];  Service: Cardiovascular;  Laterality: Right;   LOWER EXTREMITY INTERVENTION  11/18/2016   Procedure: Lower Extremity Intervention;  Surgeon: Katha Cabal, MD;  Location: [place] [facility];  Service: Cardiovascular;;   PERIPHERAL VASCULAR CATHETERIZATION Right 01/02/2015   Procedure: Lower Extremity Angiography;  Surgeon: Katha Cabal, MD;  Location: [place] [facility];  Service: Cardiovascular;  Laterality: Right;   PERIPHERAL VASCULAR CATHETERIZATION Right 06/18/2015   Procedure: Lower Extremity Angiography;  Surgeon: Algernon Huxley, MD;  Location: [place] [facility];  Service: Cardiovascular;  Laterality: Right;   PERIPHERAL VASCULAR CATHETERIZATION  06/18/2015   Procedure: Lower Extremity Intervention;  Surgeon: Algernon Huxley, MD;  Location: [place] [facility];  Service: Cardiovascular;;   PERIPHERAL VASCULAR CATHETERIZATION N/A 10/09/2015   Procedure: Abdominal Aortogram w/Lower Extremity;  Surgeon: Katha Cabal, MD;  Location: [place] [facility];  Service: Cardiovascular;  Laterality: N/A;   PERIPHERAL VASCULAR CATHETERIZATION  10/09/2015   Procedure: Lower Extremity Intervention;  Surgeon: Katha Cabal, MD;  Location: [place] [facility];  Service: Cardiovascular;;   PERIPHERAL VASCULAR CATHETERIZATION Right 10/10/2015   Procedure: Lower Extremity Angiography;  Surgeon: Algernon Huxley, MD;  Location: [place] [facility];  Service: Cardiovascular;  Laterality: Right;    PERIPHERAL VASCULAR CATHETERIZATION Right 10/10/2015   Procedure: Lower Extremity Intervention;  Surgeon: Algernon Huxley, MD;  Location: [place] [facility];  Service: Cardiovascular;  Laterality: Right;   PERIPHERAL VASCULAR CATHETERIZATION Right 12/03/2015   Procedure: Lower Extremity Angiography;  Surgeon: Algernon Huxley, MD;  Location: [place] [facility];  Service: Cardiovascular;  Laterality: Right;   PERIPHERAL VASCULAR CATHETERIZATION Right 12/04/2015   Procedure: Lower Extremity Angiography;  Surgeon: Algernon Huxley, MD;  Location: [place] [facility];  Service: Cardiovascular;  Laterality: Right;   PERIPHERAL VASCULAR CATHETERIZATION  12/04/2015   Procedure: Lower Extremity Intervention;  Surgeon: Algernon Huxley, MD;  Location: [place] [facility];  Service: Cardiovascular;;   PERIPHERAL VASCULAR CATHETERIZATION Right 06/30/2016   Procedure: Lower Extremity Angiography;  Surgeon: Algernon Huxley, MD;  Location: [place] [facility];  Service: Cardiovascular;  Laterality: Right;   PERIPHERAL VASCULAR CATHETERIZATION Right 06/25/2016   Procedure: Lower Extremity Angiography;  Surgeon: Katha Cabal, MD;  Location: [place] [facility];  Service: Cardiovascular;  Laterality: Right;   PERIPHERAL VASCULAR CATHETERIZATION Right 07/01/2016   Procedure: Lower Extremity Angiography;  Surgeon: Katha Cabal, MD;  Location: [place] [facility];  Service: Cardiovascular;  Laterality: Right;   PERIPHERAL VASCULAR CATHETERIZATION  07/01/2016   Procedure: Lower Extremity Intervention;  Surgeon: Katha Cabal, MD;  Location: [place] [facility];  Service: Cardiovascular;;   PERIPHERAL VASCULAR CATHETERIZATION Right 08/01/2016  Procedure: Lower Extremity Angiography;  Surgeon: Katha Cabal, MD;  Location: [place] [facility];  Service: Cardiovascular;  Laterality: Right;   PORTA CATH INSERTION N/A 10/05/2020   Procedure: PORTA CATH INSERTION;  Surgeon: Katha Cabal, MD;  Location: [place] [facility];  Service: Cardiovascular;  Laterality: N/A;   stent placement in right leg Right    VIDEO BRONCHOSCOPY N/A 09/14/2020   Procedure: VIDEO BRONCHOSCOPY WITH FLUORO;  Surgeon: Tyler Pita, MD;  Location: [facility];  Service: Cardiopulmonary;  Laterality: N/A;   VIDEO BRONCHOSCOPY WITH ENDOBRONCHIAL ULTRASOUND N/A 09/14/2020   Procedure: VIDEO BRONCHOSCOPY WITH ENDOBRONCHIAL ULTRASOUND;  Surgeon: Tyler Pita, MD;  Location: [facility];  Service: Cardiopulmonary;  Laterality: N/A;     Prior to Admission medications   Medication Sig Start Date End Date Taking? Authorizing Provider  acetaminophen (TYLENOL) 325 MG tablet Take 650 mg by mouth every 6 (six) hours as needed for moderate pain.    [provider]  clopidogrel (PLAVIX) 75 MG tablet Take 1 tablet (75 mg total) by mouth daily at 6 (six) AM. 04/01/20   Esco, Mariann Barter, MD  ELIQUIS 5 MG TABS tablet TAKE 1 TABLET BY MOUTH  TWICE DAILY 06/28/20   Schnier, Dolores Lory, MD  gabapentin (NEURONTIN) 300 MG capsule Take 2 capsules (600 mg total) by mouth 3 (three) times daily. Patient taking differently: Take 900 mg by mouth at bedtime as needed (pain). 05/15/20   Virginia Crews, MD  lidocaine-prilocaine (EMLA) cream Apply to affected area once 10/01/20   [name], MD  lisinopril (ZESTRIL) 10 MG tablet TAKE 1 TABLET BY MOUTH  DAILY 09/10/20   Virginia Crews, MD  Multiple Vitamin (MULTIVITAMIN) tablet Take 1 tablet by mouth daily.    [provider]  simvastatin (ZOCOR) 40 MG tablet TAKE 1 TABLET BY MOUTH AT  BEDTIME 09/10/20   Bacigalupo, Dionne Bucy, MD     Allergies Tape   Family History  Problem Relation Age of Onset   Diabetes Mother    Hypertension Mother    Heart murmur Mother    Leukemia Mother    Throat cancer Maternal Grandmother    Colon cancer Neg Hx    Breast cancer Neg Hx     Social History Social History   Tobacco Use   Smoking status: Former    Packs/day: 2.00    Years:  38.00    Pack years: 76.00    Types: Cigarettes    Quit date: 12/20/2014    Years since quitting: 6.0   Smokeless tobacco: Former   Tobacco comments:    quit smoking in 2017 most smoked 2   Vaping Use   Vaping Use: Never used  Substance Use Topics   Alcohol use: Not Currently   Drug use: No    Review of Systems  Constitutional:   No fever or chills.  ENT:   No sore throat. No rhinorrhea. Cardiovascular:   Positive atypical chest pain as above without syncope. Respiratory:   No dyspnea or cough. Gastrointestinal:   Negative for abdominal pain, vomiting and diarrhea.  Musculoskeletal:   Negative for focal pain or swelling All other systems reviewed and are negative except as documented above in ROS and HPI.  ____________________________________________   PHYSICAL EXAM:  VITAL SIGNS: ED Triage Vitals  Enc Vitals Group     BP 01/14/21 0954 129/81     Pulse Rate 01/14/21 0954 (!) 123     Resp 01/14/21 0954 16  Temp 01/14/21 0954 98 F (36.7 C)     Temp Source 01/14/21 0954 Oral     SpO2 01/14/21 0954 96 %     Weight 01/14/21 0955 192 lb 10.9 oz (87.4 kg)     Height 01/14/21 0955 5\' 10"  (1.778 m)     Head Circumference --      Peak Flow --      Pain Score 01/14/21 0954 0     Pain Loc --      Pain Edu? --      Excl. in [place]? --     Vital signs reviewed, nursing assessments reviewed.   Constitutional:   Alert and oriented. Non-toxic appearance. Eyes:   Conjunctivae are normal. EOMI. PERRL. ENT      Head:   Normocephalic and atraumatic.      Nose:   Normal      Mouth/Throat:   Dry mucous membranes.      Neck:   No meningismus. Full ROM. Hematological/Lymphatic/Immunilogical:   No cervical lymphadenopathy. Cardiovascular:   Tachycardia heart rate 105. Symmetric bilateral radial and DP pulses.  No murmurs. Cap refill less than 2 seconds. Respiratory:   Normal respiratory effort without tachypnea/retractions. Breath sounds are clear and equal bilaterally. No  wheezes/rales/rhonchi. Gastrointestinal:   Soft and nontender. Non distended. There is no CVA tenderness.  No rebound, rigidity, or guarding. Genitourinary:   deferred Musculoskeletal:   Normal range of motion in all extremities. No joint effusions.  No lower extremity tenderness.  No edema. Neurologic:   Normal speech and language.  Motor grossly intact. No acute focal neurologic deficits are appreciated.  Skin:    Skin is warm, dry and intact. No rash noted.  No petechiae, purpura, or bullae.  ____________________________________________    LABS (pertinent positives/negatives) (all labs ordered are listed, but only abnormal results are displayed) Labs Reviewed  RESP PANEL BY RT-PCR (FLU A&B, COVID) [id number]   ____________________________________________   EKG  Interpreted by me Sinus tachycardia rate 118.  Normal axis and intervals.  Normal QRS ST segments and T waves.  Poor R wave progression.  No evidence of right heart strain.  ____________________________________________    RADIOLOGY  DG Chest 2 View  Result Date: 01/14/2021 CLINICAL DATA:  Tachycardia.  History of lung cancer. EXAM: CHEST - 2 VIEW COMPARISON:  09/14/2020 and CT of the chest on 12/25/2020 FINDINGS: Patient has RIGHT-sided PowerPort, tip to the superior vena cava. There is no pneumothorax. Cardiac mediastinal silhouette is normal. There is minimal focal patchy opacity in the RIGHT UPPER lobe, consistent with atelectasis or early infiltrate. No frank consolidations. No evidence for pulmonary edema. No pleural effusions. Remote cervical spine fusion. IMPRESSION: 1. RIGHT-sided power port. 2. RIGHT UPPER lobe infiltrate or atelectasis. Electronically Signed   By: Nolon Nations M.D.   On: 01/14/2021 11:16    ____________________________________________   PROCEDURES Procedures  ____________________________________________  DIFFERENTIAL DIAGNOSIS   Dehydration, pneumonia, COVID/flu  CLINICAL IMPRESSION  / ASSESSMENT AND PLAN / ED COURSE  Medications ordered in the ED: Medications  lactated ringers bolus 1,000 mL (0 mLs Intravenous Stopped 01/14/21 1249)  heparin lock flush 100 UNIT/ML injection (500 Units  Given 01/14/21 1330)    Pertinent labs & imaging results that were available during my care of the patient were reviewed by me and considered in my medical decision making (see chart for details).  NOHLAN BURDIN was evaluated in Emergency Department on 01/14/2021 for the symptoms described in the history of present illness. He was  evaluated in the context of the global COVID-19 pandemic, which necessitated consideration that the patient might be at risk for infection with the SARS-CoV-2 virus that causes COVID-19. Institutional protocols and algorithms that pertain to the evaluation of patients at risk for COVID-19 are in a state of rapid change based on information released by regulatory bodies including the CDC and federal and state organizations. These policies and algorithms were followed during the patient's care in the ED.   Patient presents with vague atypical chest pain. Considering the patient's symptoms, medical history, and physical examination today, I have low suspicion for ACS, PE, TAD, pneumothorax, carditis, mediastinitis, pneumonia, CHF, or sepsis.  Feeling much better after IV fluids, heart rate normalized.  COVID and flu negative, chest x-ray unremarkable.  Stable for discharge.      ____________________________________________   FINAL CLINICAL IMPRESSION(S) / ED DIAGNOSES    Final diagnoses:  Tachycardia  Dehydration     ED Discharge Orders     None       Portions of this note were generated with dragon dictation software. Dictation errors may occur despite best attempts at proofreading.   Carrie Mew, MD 01/14/21 1352

## 2021-01-14 NOTE — Progress Notes (Signed)
Patient states his HR is a little high today. Patient has no concerns at the moment.

## 2021-01-14 NOTE — Progress Notes (Addendum)
Hematology/Oncology  note [facility] Telephone:([phone] [phone]   Patient Care Team: [name], MD as PCP - General (Physician Assistant) [name], MD as Referring Physician (Physician Assistant) Bary Castilla, [name], MD (General Surgery) Telford Nab, RN as Oncology Nurse Navigator  REFERRING PROVIDER: [name], MD  CHIEF COMPLAINTS/REASON FOR VISIT:  Follow up for lung cancer  HISTORY OF PRESENTING ILLNESS:   Joshua [name] is a  65 y.o.  male with PMH listed below was seen in consultation at the request of  [name], MD  for evaluation of lung mass  Patient presented to emergency room on 08/25/2020 for evaluation of hemoptysis, coughing.  He coughed up small amount of blood and a large clot.  No fever, chills, chest pain, abdominal pain.  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  Patient is on Plavix and Eliquis.  Since his ER visit, he has not coughed up more blood.  08/25/2020 CT angio chest PE with and without contrast showed ill-defined soft tissue adjacent to the right upper lobe bronchus resulting in narrowing of right upper lobe bronchus.  Also right paratracheal mediastinal adenopathy.  Scattered centrilobular groundglass opacities within the right lower lobe and to a lesser extent within the right lower lobe likely reflecting an infectious or inflammatory process. # 09/14/2020, right upper lobe lung biopsy and bronchoscopy brushing are both positive for squamous cell carcinoma.Right precarinal space lymph node insufficient lymph node sampling for staging. 09/18/2020, PET scan showed right upper lobe nodule maximal SUV 11.5.  Hypermetabolic cluster right paratracheal adenopathy with maximum SUV up to 12.  No findings of metastatic disease to the neck/abdomen/pelvis or  skeleton.  # 10/03/2000, MRI brain with and without contrast showed no evidence of intracranial metastatic disease. 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  # - NGS showed PD-L1 TPS 95%, LRP1B S930fs, PIK3CA E545K, RB1 c1422-2A>T, RIT1 T83_ A84del, STK11 P272fs, TPS53 R248W, TMB 19.21mut/mb, MS stable.   #10/10/2020 started concurrent chemoradiation.  Patient received 4 cycles of carboplatin AUC of 2 and Taxol 45 mg per metered square  Additional chemotherapy was held due to thrombocytopenia, neutropenia Patient finished radiation on 11/27/2020 INTERVAL HISTORY Joshua [name] is a 65 y.o. male who has above history reviewed by me today presents for follow up visit for Stage III lung squamous cell carcinoma. Patient reports not feeling well since last week.  Intermittent chest pain, chronic shortness of breath not worse.  he has also been tachycardic with HR 117 today.  Intermittent nausea, no vomiting.     Review of Systems  Constitutional:  Negative for appetite change, chills, fatigue, fever and unexpected weight change.  HENT:   Negative for hearing loss and voice change.   Eyes:  Negative for eye problems and icterus.  Respiratory:  Negative for chest tightness, cough, hemoptysis and shortness of breath.   Cardiovascular:  Negative for chest pain and leg swelling.  Gastrointestinal:  Negative for abdominal distention and abdominal pain.  Endocrine: Negative for hot flashes.  Genitourinary:  Negative for difficulty urinating, dysuria and frequency.   Musculoskeletal:  Negative for arthralgias.  Skin:  Negative for itching and rash.  Neurological:  Negative for light-headedness and numbness.  Hematological:  Negative for adenopathy. Does not bruise/bleed easily.  Psychiatric/Behavioral:  Negative for confusion.  MEDICAL HISTORY:  Past Medical History:  Diagnosis Date   Allergy     Dyspnea    former smoker. only doe   History of kidney stones    Hypertension    Peripheral vascular disease ([place])    Pre-diabetes    Sleep apnea     SURGICAL HISTORY: Past Surgical History:  Procedure Laterality Date   ANTERIOR CERVICAL DECOMP/DISCECTOMY FUSION N/A 03/14/2020   Procedure: ANTERIOR CERVICAL DECOMPRESSION/DISCECTOMY FUSION 3 LEVELS C4-7;  Surgeon: Meade Maw, MD;  Location: [facility];  Service: Neurosurgery;  Laterality: N/A;   BACK SURGERY  2011   CENTRAL LINE INSERTION Right 09/10/2016   Procedure: CENTRAL LINE INSERTION;  Surgeon: Katha Cabal, MD;  Location: [facility];  Service: Vascular;  Laterality: Right;   CERVICAL FUSION     C4-c7 on Mar 14, 2020   COLONOSCOPY  2013 ?   CORONARY ANGIOPLASTY     FEMORAL-POPLITEAL BYPASS GRAFT Right 09/10/2016   Procedure: BYPASS GRAFT FEMORAL-POPLITEAL ARTERY ( BELOW KNEE );  Surgeon: Katha Cabal, MD;  Location: [facility];  Service: Vascular;  Laterality: Right;   FEMORAL-TIBIAL BYPASS GRAFT Right 03/23/2020   Procedure: BYPASS GRAFT RIGHT FEMORAL- DISTAL TIBIAL ARTERY WITH DISTAL FLOW GRAFT;  Surgeon: Katha Cabal, MD;  Location: [facility];  Service: Vascular;  Laterality: Right;   LOWER EXTREMITY ANGIOGRAPHY Right 11/18/2016   Procedure: Lower Extremity Angiography;  Surgeon: Katha Cabal, MD;  Location: [place] [facility];  Service: Cardiovascular;  Laterality: Right;   LOWER EXTREMITY ANGIOGRAPHY Right 12/09/2016   Procedure: Lower Extremity Angiography;  Surgeon: Katha Cabal, MD;  Location: [place] [facility];  Service: Cardiovascular;  Laterality: Right;   LOWER EXTREMITY ANGIOGRAPHY Right 11/15/2019   Procedure: LOWER EXTREMITY ANGIOGRAPHY;  Surgeon: Katha Cabal, MD;  Location: [place] [facility];  Service: Cardiovascular;  Laterality: Right;   LOWER EXTREMITY ANGIOGRAPHY Right 12/01/2019   Procedure: Lower Extremity Angiography;  Surgeon: Algernon Huxley, MD;  Location: [place]  [facility];  Service: Cardiovascular;  Laterality: Right;   LOWER EXTREMITY ANGIOGRAPHY Right 12/02/2019   Procedure: LOWER EXTREMITY ANGIOGRAPHY;  Surgeon: Katha Cabal, MD;  Location: [place] [facility];  Service: Cardiovascular;  Laterality: Right;   LOWER EXTREMITY ANGIOGRAPHY Right 03/23/2020   Procedure: Lower Extremity Angiography;  Surgeon: Katha Cabal, MD;  Location: [place] [facility];  Service: Cardiovascular;  Laterality: Right;   LOWER EXTREMITY INTERVENTION  11/18/2016   Procedure: Lower Extremity Intervention;  Surgeon: Katha Cabal, MD;  Location: [place] [facility];  Service: Cardiovascular;;   PERIPHERAL VASCULAR CATHETERIZATION Right 01/02/2015   Procedure: Lower Extremity Angiography;  Surgeon: Katha Cabal, MD;  Location: [place] [facility];  Service: Cardiovascular;  Laterality: Right;   PERIPHERAL VASCULAR CATHETERIZATION Right 06/18/2015   Procedure: Lower Extremity Angiography;  Surgeon: Algernon Huxley, MD;  Location: [place] [facility];  Service: Cardiovascular;  Laterality: Right;   PERIPHERAL VASCULAR CATHETERIZATION  06/18/2015   Procedure: Lower Extremity Intervention;  Surgeon: Algernon Huxley, MD;  Location: [place] [facility];  Service: Cardiovascular;;   PERIPHERAL VASCULAR CATHETERIZATION N/A 10/09/2015   Procedure: Abdominal Aortogram w/Lower Extremity;  Surgeon: Katha Cabal, MD;  Location: [place] [facility];  Service: Cardiovascular;  Laterality: N/A;   PERIPHERAL VASCULAR CATHETERIZATION  10/09/2015   Procedure: Lower Extremity Intervention;  Surgeon: Katha Cabal, MD;  Location: [place] [facility];  Service: Cardiovascular;;   PERIPHERAL VASCULAR CATHETERIZATION Right 10/10/2015  Procedure: Lower Extremity Angiography;  Surgeon: Algernon Huxley, MD;  Location: [place] [facility];  Service: Cardiovascular;  Laterality: Right;   PERIPHERAL VASCULAR CATHETERIZATION Right 10/10/2015   Procedure: Lower Extremity Intervention;   Surgeon: Algernon Huxley, MD;  Location: [place] [facility];  Service: Cardiovascular;  Laterality: Right;   PERIPHERAL VASCULAR CATHETERIZATION Right 12/03/2015   Procedure: Lower Extremity Angiography;  Surgeon: Algernon Huxley, MD;  Location: [place] [facility];  Service: Cardiovascular;  Laterality: Right;   PERIPHERAL VASCULAR CATHETERIZATION Right 12/04/2015   Procedure: Lower Extremity Angiography;  Surgeon: Algernon Huxley, MD;  Location: [place] [facility];  Service: Cardiovascular;  Laterality: Right;   PERIPHERAL VASCULAR CATHETERIZATION  12/04/2015   Procedure: Lower Extremity Intervention;  Surgeon: Algernon Huxley, MD;  Location: [place] [facility];  Service: Cardiovascular;;   PERIPHERAL VASCULAR CATHETERIZATION Right 06/30/2016   Procedure: Lower Extremity Angiography;  Surgeon: Algernon Huxley, MD;  Location: [place] [facility];  Service: Cardiovascular;  Laterality: Right;   PERIPHERAL VASCULAR CATHETERIZATION Right 06/25/2016   Procedure: Lower Extremity Angiography;  Surgeon: Katha Cabal, MD;  Location: [place] [facility];  Service: Cardiovascular;  Laterality: Right;   PERIPHERAL VASCULAR CATHETERIZATION Right 07/01/2016   Procedure: Lower Extremity Angiography;  Surgeon: Katha Cabal, MD;  Location: [place] [facility];  Service: Cardiovascular;  Laterality: Right;   PERIPHERAL VASCULAR CATHETERIZATION  07/01/2016   Procedure: Lower Extremity Intervention;  Surgeon: Katha Cabal, MD;  Location: [place] [facility];  Service: Cardiovascular;;   PERIPHERAL VASCULAR CATHETERIZATION Right 08/01/2016   Procedure: Lower Extremity Angiography;  Surgeon: Katha Cabal, MD;  Location: [place] [facility];  Service: Cardiovascular;  Laterality: Right;   PORTA CATH INSERTION N/A 10/05/2020   Procedure: PORTA CATH INSERTION;  Surgeon: Katha Cabal, MD;  Location: [place] [facility];  Service: Cardiovascular;  Laterality: N/A;   stent placement in right leg Right     VIDEO BRONCHOSCOPY N/A 09/14/2020   Procedure: VIDEO BRONCHOSCOPY WITH FLUORO;  Surgeon: Tyler Pita, MD;  Location: [facility];  Service: Cardiopulmonary;  Laterality: N/A;   VIDEO BRONCHOSCOPY WITH ENDOBRONCHIAL ULTRASOUND N/A 09/14/2020   Procedure: VIDEO BRONCHOSCOPY WITH ENDOBRONCHIAL ULTRASOUND;  Surgeon: Tyler Pita, MD;  Location: [facility];  Service: Cardiopulmonary;  Laterality: N/A;    SOCIAL HISTORY: Social History   Socioeconomic History   Marital status: Single    Spouse name: Not on file   Number of children: Not on file   Years of education: Not on file   Highest education level: Not on file  Occupational History   Occupation: unemployed  Tobacco Use   Smoking status: Former    Packs/day: 2.00    Years: 38.00    Pack years: 76.00    Types: Cigarettes    Quit date: 12/20/2014    Years since quitting: 6.0   Smokeless tobacco: Former   Tobacco comments:    quit smoking in 2017 most smoked 2   Vaping Use   Vaping Use: Never used  Substance and Sexual Activity   Alcohol use: Not Currently   Drug use: No   Sexual activity: Yes  Other Topics Concern   Not on file  Social History Narrative   Not on file   Social Determinants of Health   Financial Resource Strain: Not on file  Food Insecurity: Not on file  Transportation Needs: Not on file  Physical Activity: Not on file  Stress: Not on  file  Social Connections: Not on file  Intimate Partner Violence: Not on file    FAMILY HISTORY: Family History  Problem Relation Age of Onset   Diabetes Mother    Hypertension Mother    Heart murmur Mother    Leukemia Mother    Throat cancer Maternal Grandmother    Colon cancer Neg Hx    Breast cancer Neg Hx     ALLERGIES:  is allergic to tape.  MEDICATIONS:  Current Outpatient Medications  Medication Sig Dispense Refill   acetaminophen (TYLENOL) 325 MG tablet Take 650 mg by mouth every 6 (six) hours as needed for moderate pain.     clopidogrel  (PLAVIX) 75 MG tablet Take 1 tablet (75 mg total) by mouth daily at 6 (six) AM. 60 tablet 1   ELIQUIS 5 MG TABS tablet TAKE 1 TABLET BY MOUTH  TWICE DAILY 180 tablet 3   gabapentin (NEURONTIN) 300 MG capsule Take 2 capsules (600 mg total) by mouth 3 (three) times daily. (Patient taking differently: Take 900 mg by mouth at bedtime as needed (pain).) 540 capsule 1   lidocaine-prilocaine (EMLA) cream Apply to affected area once 30 g 3   lisinopril (ZESTRIL) 10 MG tablet TAKE 1 TABLET BY MOUTH  DAILY 90 tablet 0   Multiple Vitamin (MULTIVITAMIN) tablet Take 1 tablet by mouth daily.     simvastatin (ZOCOR) 40 MG tablet TAKE 1 TABLET BY MOUTH AT  BEDTIME 90 tablet 0   No current facility-administered medications for this visit.     PHYSICAL EXAMINATION: ECOG PERFORMANCE STATUS: 1 - Symptomatic but completely ambulatory Vitals:   01/14/21 0905  BP: 114/68  Pulse: (!) 117  Resp: 18  Temp: (!) 96.2 F (35.7 C)  SpO2: 96%   Filed Weights   01/14/21 0905  Weight: 192 lb 11.2 oz (87.4 kg)    Physical Exam Constitutional:      General: He is not in acute distress.    Comments: Walks with a cane.   HENT:     Head: Normocephalic and atraumatic.  Eyes:     General: No scleral icterus. Cardiovascular:     Rate and Rhythm: Regular rhythm. Tachycardia present.     Heart sounds: Normal heart sounds.  Pulmonary:     Effort: Pulmonary effort is normal. No respiratory distress.     Breath sounds: No wheezing.  Abdominal:     General: Bowel sounds are normal. There is no distension.     Palpations: Abdomen is soft.  Musculoskeletal:        General: No deformity. Normal range of motion.     Cervical back: Normal range of motion and neck supple.  Skin:    General: Skin is warm and dry.     Findings: No erythema or rash.  Neurological:     Mental Status: He is alert and oriented to person, place, and time. Mental status is at baseline.     Cranial Nerves: No cranial nerve deficit.      Coordination: Coordination normal.  Psychiatric:        Mood and Affect: Mood normal.    LABORATORY DATA:  I have reviewed the data as listed Lab Results  Component Value Date   WBC 5.1 01/14/2021   HGB 11.8 (L) 01/14/2021   HCT 36.1 (L) 01/14/2021   MCV 86.8 01/14/2021   PLT 193 01/14/2021   Recent Labs    03/28/20 0421 03/29/20 0444 03/30/20 0536 04/16/20 1525 08/25/20 1512 11/21/20 1032 12/25/20 1240  12/31/20 0817 01/14/21 0851  NA 133* 139 140  --    < > 139  --  138 135  K 4.1 4.0 4.1  --    < > 3.8  --  3.6 4.0  CL 101 103 102  --    < > 104  --  102 98  CO2 $Re'28 28 28  'UQe$ --    < > 25  --  26 26  GLUCOSE 116* 101* 99  --    < > 124*  --  150* 157*  BUN $Re'11 12 12  'ROr$ --    < > 13  --  18 20  CREATININE 0.76 0.85 0.76  --    < > 0.88 1.00 1.05 1.04  CALCIUM 8.2* 8.4* 8.6*  --    < > 9.2  --  9.0 9.3  GFRNONAA >60 >60 >60  --    < > >60  --  >60 >60  GFRAA >60 >60 >60  --   --   --   --   --   --   PROT  --   --   --  7.0   < > 7.5  --  7.1 7.2  ALBUMIN  --   --   --  4.3   < > 3.9  --  4.0 3.9  AST  --   --   --  17   < > 29  --  25 36  ALT  --   --   --  19   < > 30  --  20 40  ALKPHOS  --   --   --  123*   < > 82  --  75 92  BILITOT  --   --   --  0.4   < > 0.4  --  0.4 0.7  BILIDIR  --   --   --  0.10  --   --   --   --   --    < > = values in this interval not displayed.    Iron/TIBC/Ferritin/ %Sat    Component Value Date/Time   IRON 48 08/30/2020 1052   TIBC 391 08/30/2020 1052   FERRITIN 25 08/30/2020 1052   IRONPCTSAT 12 (L) 08/30/2020 1052      RADIOGRAPHIC STUDIES: I have personally reviewed the radiological images as listed and agreed with the findings in the report. CT Chest W Contrast  Result Date: 12/26/2020 CLINICAL DATA:  Restaging lung cancer. EXAM: CT CHEST WITH CONTRAST TECHNIQUE: Multidetector CT imaging of the chest was performed during intravenous contrast administration. CONTRAST:  2mL OMNIPAQUE IOHEXOL 300 MG/ML  SOLN COMPARISON:  PET-CT  09/18/2020 FINDINGS: Cardiovascular: The heart size appears within normal limits. No pericardial effusion. Coronary artery calcifications noted. Mediastinum/Nodes: Normal appearance of the thyroid gland. The trachea appears patent and is midline. Normal appearance of the esophagus. High right paratracheal lymph node measures 1 cm, image 38/2. Previously 1.6 cm. Low right paratracheal lymph node measures 1 cm, image 66/2. Unchanged from previous exam. Lungs/Pleura: No pleural effusion. No airspace consolidation, atelectasis, or pneumothorax. Paraseptal emphysema. Right hilar mass/adenopathy measures 2.1 x 1.3 cm, image 72/2. Previously 2.3 x 2.5 cm. No new pulmonary nodule or mass. Upper Abdomen: No acute abnormality. Musculoskeletal: No chest wall abnormality. No acute or significant osseous findings. IMPRESSION: 1. Interval response to therapy. The right hilar mass/adenopathy has decreased in size in the interval. 2. Stable to decreased size of right paratracheal lymph  node. 3. Coronary artery calcifications. 4. Emphysema and aortic atherosclerosis. Aortic Atherosclerosis (ICD10-I70.0) and Emphysema (ICD10-J43.9). Electronically Signed   By: Kerby Moors M.D.   On: 12/26/2020 09:21       ASSESSMENT & PLAN:  1. Squamous cell carcinoma of right lung ([place])   2. Port-A-Cath in place   3. Encounter for antineoplastic immunotherapy   4. Chest pain, unspecified type   5. Elevated serum free T4 level   Cancer Staging Squamous cell carcinoma of right lung [facility]) Staging form: Lung, AJCC 8th Edition - Clinical stage from 09/20/2020: Stage IIIA (cT1b, cN2, cM0) - Signed by [name], MD on 09/20/2020   #Squamous cell carcinoma of right lung, stage IIIA, Post concurrent chemotherapy radiation CT scan shows treatment response. Labs reviewed and discussed. He can proceed with immunotherapy after his acute symptoms being worked up and resolves  #Tachycardia, nausea, intermittent chest pain.  He is a former  smoker, history of peripheral vascular disease. Recommend patient to go to emergency room for work-up of chest pain and tachycardia.  Patient is on Eliquis and Plavix.  I called ER and talked to triage nurse If no acute cardiovascular condition, he can receive immunotherapy later this week  #Elevated T4 and suppressed TSH prior to immunotherapy.  Likely hyperthyroidism.  repeat with the next visit.  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  Management per D. #Anemia, likely due to chemotherapy and radiation.  hemoglobin has improved.  All questions were answered. The patient knows to call the clinic with any problems questions or concerns.  cc [name], MD    Return of visit: 2 weeks [name], MD, PhD Hematology Oncology [facility] Pager- [phone] 01/14/2021

## 2021-01-14 NOTE — ED Notes (Signed)
Dr. Stafford at bedside.  

## 2021-01-16 ENCOUNTER — Other Ambulatory Visit (INDEPENDENT_AMBULATORY_CARE_PROVIDER_SITE_OTHER): Payer: Self-pay | Admitting: Vascular Surgery

## 2021-01-16 DIAGNOSIS — I739 Peripheral vascular disease, unspecified: Secondary | ICD-10-CM

## 2021-01-17 ENCOUNTER — Ambulatory Visit (INDEPENDENT_AMBULATORY_CARE_PROVIDER_SITE_OTHER): Payer: Medicare Other

## 2021-01-17 ENCOUNTER — Ambulatory Visit (INDEPENDENT_AMBULATORY_CARE_PROVIDER_SITE_OTHER): Payer: Medicare Other | Admitting: Nurse Practitioner

## 2021-01-17 ENCOUNTER — Encounter: Payer: Self-pay | Admitting: Oncology

## 2021-01-17 ENCOUNTER — Other Ambulatory Visit: Payer: Self-pay

## 2021-01-17 ENCOUNTER — Encounter (INDEPENDENT_AMBULATORY_CARE_PROVIDER_SITE_OTHER): Payer: Self-pay | Admitting: Nurse Practitioner

## 2021-01-17 VITALS — BP 112/66 | HR 114 | Resp 16 | Wt 193.0 lb

## 2021-01-17 DIAGNOSIS — E782 Mixed hyperlipidemia: Secondary | ICD-10-CM | POA: Diagnosis not present

## 2021-01-17 DIAGNOSIS — I70219 Atherosclerosis of native arteries of extremities with intermittent claudication, unspecified extremity: Secondary | ICD-10-CM | POA: Diagnosis not present

## 2021-01-17 DIAGNOSIS — I739 Peripheral vascular disease, unspecified: Secondary | ICD-10-CM

## 2021-01-17 DIAGNOSIS — I10 Essential (primary) hypertension: Secondary | ICD-10-CM

## 2021-01-17 NOTE — Addendum Note (Signed)
Addended by: [name] on: 01/17/2021 09:41 PM   Modules accepted: Orders

## 2021-01-24 ENCOUNTER — Inpatient Hospital Stay: Payer: Medicare Other | Admitting: Oncology

## 2021-01-24 ENCOUNTER — Inpatient Hospital Stay: Payer: Medicare Other | Attending: Oncology

## 2021-01-24 ENCOUNTER — Other Ambulatory Visit: Payer: Self-pay

## 2021-01-24 ENCOUNTER — Encounter: Payer: Self-pay | Admitting: Oncology

## 2021-01-24 ENCOUNTER — Inpatient Hospital Stay: Payer: Medicare Other

## 2021-01-24 VITALS — BP 106/67 | HR 90 | Temp 98.1°F | Resp 18 | Wt 196.7 lb

## 2021-01-24 DIAGNOSIS — Z79899 Other long term (current) drug therapy: Secondary | ICD-10-CM | POA: Insufficient documentation

## 2021-01-24 DIAGNOSIS — E861 Hypovolemia: Secondary | ICD-10-CM | POA: Insufficient documentation

## 2021-01-24 DIAGNOSIS — Z5112 Encounter for antineoplastic immunotherapy: Secondary | ICD-10-CM | POA: Insufficient documentation

## 2021-01-24 DIAGNOSIS — I9589 Other hypotension: Secondary | ICD-10-CM | POA: Diagnosis not present

## 2021-01-24 DIAGNOSIS — R000 Tachycardia, unspecified: Secondary | ICD-10-CM | POA: Insufficient documentation

## 2021-01-24 DIAGNOSIS — R0789 Other chest pain: Secondary | ICD-10-CM | POA: Diagnosis not present

## 2021-01-24 DIAGNOSIS — C3491 Malignant neoplasm of unspecified part of right bronchus or lung: Secondary | ICD-10-CM

## 2021-01-24 DIAGNOSIS — Z95828 Presence of other vascular implants and grafts: Secondary | ICD-10-CM

## 2021-01-24 DIAGNOSIS — R7989 Other specified abnormal findings of blood chemistry: Secondary | ICD-10-CM

## 2021-01-24 DIAGNOSIS — C3411 Malignant neoplasm of upper lobe, right bronchus or lung: Secondary | ICD-10-CM | POA: Diagnosis present

## 2021-01-24 DIAGNOSIS — R110 Nausea: Secondary | ICD-10-CM | POA: Insufficient documentation

## 2021-01-24 LAB — COMPREHENSIVE METABOLIC PANEL
ALT: 35 U/L (ref 0–44)
AST: 26 U/L (ref 15–41)
Albumin: 3.9 g/dL (ref 3.5–5.0)
Alkaline Phosphatase: 90 U/L (ref 38–126)
Anion gap: 8 (ref 5–15)
BUN: 11 mg/dL (ref 8–23)
CO2: 27 mmol/L (ref 22–32)
Calcium: 9.2 mg/dL (ref 8.9–10.3)
Chloride: 104 mmol/L (ref 98–111)
Creatinine, Ser: 0.95 mg/dL (ref 0.61–1.24)
GFR, Estimated: >60 mL/min (ref >60)
Glucose, Bld: 120 mg/dL — ABNORMAL HIGH (ref 70–99)
Potassium: 3.6 mmol/L (ref 3.5–5.1)
Sodium: 139 mmol/L (ref 135–145)
Total Bilirubin: 0.5 mg/dL (ref 0.3–1.2)
Total Protein: 7.3 g/dL (ref 6.5–8.1)

## 2021-01-24 LAB — CBC WITH DIFFERENTIAL/PLATELET
Abs Immature Granulocytes: 0.02 10*3/uL (ref 0.00–0.07)
Basophils Absolute: 0.1 10*3/uL (ref 0.0–0.1)
Basophils Relative: 1 %
Eosinophils Absolute: 0.3 10*3/uL (ref 0.0–0.5)
Eosinophils Relative: 4 %
HCT: 37 % — ABNORMAL LOW (ref 39.0–52.0)
Hemoglobin: 12.3 g/dL — ABNORMAL LOW (ref 13.0–17.0)
Immature Granulocytes: 0 %
Lymphocytes Relative: 17 %
Lymphs Abs: 1 10*3/uL (ref 0.7–4.0)
MCH: 28.1 pg (ref 26.0–34.0)
MCHC: 33.2 g/dL (ref 30.0–36.0)
MCV: 84.7 fL (ref 80.0–100.0)
Monocytes Absolute: 0.5 10*3/uL (ref 0.1–1.0)
Monocytes Relative: 9 %
Neutro Abs: 3.9 10*3/uL (ref 1.7–7.7)
Neutrophils Relative %: 69 %
Platelets: 229 10*3/uL (ref 150–400)
RBC: 4.37 MIL/uL (ref 4.22–5.81)
RDW: 14.4 % (ref 11.5–15.5)
WBC: 5.7 10*3/uL (ref 4.0–10.5)
nRBC: 0 % (ref 0.0–0.2)

## 2021-01-24 LAB — TSH: TSH: 3.618 u[IU]/mL (ref 0.350–4.500)

## 2021-01-24 MED ORDER — SODIUM CHLORIDE 0.9 % IV SOLN
Freq: Once | INTRAVENOUS | Status: AC
  Filled 2021-01-24: qty 250

## 2021-01-24 MED ORDER — HEPARIN SOD (PORK) LOCK FLUSH 100 UNIT/ML IV SOLN
500.0000 [IU] | Freq: Once | INTRAVENOUS | Status: AC | PRN
  Administered 2021-01-24: 500 [IU]
  Filled 2021-01-24: qty 5

## 2021-01-24 MED ORDER — SODIUM CHLORIDE 0.9 % IV SOLN
10.0000 mg/kg | Freq: Once | INTRAVENOUS | Status: AC
  Administered 2021-01-24: 860 mg via INTRAVENOUS
  Filled 2021-01-24: qty 10

## 2021-01-24 NOTE — Patient Instructions (Signed)
[facility] [address] [facility] [place]  Discharge Instructions: Thank you for choosing [facility] to provide your oncology and hematology care.  If you have a lab appointment with the Cancer Center, please go directly to the Cancer Center and check in at the registration area.  Wear comfortable clothing and clothing appropriate for easy access to any Portacath or PICC line.   We strive to give you quality time with your provider. You may need to reschedule your appointment if you arrive late (15 or more minutes).  Arriving late affects you and other patients whose appointments are after yours.  Also, if you miss three or more appointments without notifying the office, you may be dismissed from the clinic at the provider's discretion.      For prescription refill requests, have your pharmacy contact our office and allow 72 hours for refills to be completed.    Today you received the following chemotherapy and/or immunotherapy agents       To help prevent nausea and vomiting after your treatment, we encourage you to take your nausea medication as directed.  BELOW ARE SYMPTOMS THAT SHOULD BE REPORTED IMMEDIATELY: *FEVER GREATER THAN 100.4 F (38 C) OR HIGHER *CHILLS OR SWEATING *NAUSEA AND VOMITING THAT IS NOT CONTROLLED WITH YOUR NAUSEA MEDICATION *UNUSUAL SHORTNESS OF BREATH *UNUSUAL BRUISING OR BLEEDING *URINARY PROBLEMS (pain or burning when urinating, or frequent urination) *BOWEL PROBLEMS (unusual diarrhea, constipation, pain near the anus) TENDERNESS IN MOUTH AND THROAT WITH OR WITHOUT PRESENCE OF ULCERS (sore throat, sores in mouth, or a toothache) UNUSUAL RASH, SWELLING OR PAIN  UNUSUAL VAGINAL DISCHARGE OR ITCHING   Items with * indicate a potential emergency and should be followed up as soon as possible or go to the Emergency Department if any problems should occur.  Please show the CHEMOTHERAPY ALERT CARD or IMMUNOTHERAPY ALERT CARD at check-in to the Emergency  Department and triage nurse.  Should you have questions after your visit or need to cancel or reschedule your appointment, please contact [facility] [place]  [phone] and follow the prompts.  Office hours are 8:00 a.m. to 4:30 p.m. Monday - Friday. Please note that voicemails left after 4:00 p.m. may not be returned until the following business day.  We are closed weekends and major holidays. You have access to a nurse at all times for urgent questions. Please call the main number to the clinic [phone] and follow the prompts.  For any non-urgent questions, you may also contact your provider using MyChart. We now offer e-Visits for anyone 18 and older to request care online for non-urgent symptoms. For details visit [email or web address].   Also download the MyChart app! Go to the app store, search "MyChart", open the app, select [facility], and log in with your MyChart username and password.  Due to Covid, a mask is required upon entering the hospital/clinic. If you do not have a mask, one will be given to you upon arrival. For doctor visits, patients may have 1 support person aged 18 or older with them. For treatment visits, patients cannot have anyone with them due to current Covid guidelines and our immunocompromised population.  

## 2021-01-24 NOTE — Progress Notes (Addendum)
Hematology/Oncology  note [facility] Telephone:([phone] [phone]   Patient Care Team: Patrice Paradise, MD as PCP - General (Physician Assistant) Patrice Paradise, MD as Referring Physician (Physician Assistant) Lemar Livings, Merrily Pew, MD (General Surgery) Glory Buff, RN as Oncology Nurse Navigator  REFERRING PROVIDER: Patrice Paradise, MD  CHIEF COMPLAINTS/REASON FOR VISIT:  Follow up for lung cancer  HISTORY OF PRESENTING ILLNESS:   Joshua [name] is a  65 y.o.  male with PMH listed below was seen in consultation at the request of  Patrice Paradise, MD  for evaluation of lung mass  Patient presented to emergency room on 08/25/2020 for evaluation of hemoptysis, coughing.  He coughed up small amount of blood and a large clot.  No fever, chills, chest pain, abdominal pain.  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  Patient is on Plavix and Eliquis.  Since his ER visit, he has not coughed up more blood.  08/25/2020 CT angio chest PE with and without contrast showed ill-defined soft tissue adjacent to the right upper lobe bronchus resulting in narrowing of right upper lobe bronchus.  Also right paratracheal mediastinal adenopathy.  Scattered centrilobular groundglass opacities within the right lower lobe and to a lesser extent within the right lower lobe likely reflecting an infectious or inflammatory process. # 09/14/2020, right upper lobe lung biopsy and bronchoscopy brushing are both positive for squamous cell carcinoma.Right precarinal space lymph node insufficient lymph node sampling for staging. 09/18/2020, PET scan showed right upper lobe nodule maximal SUV 11.5.  Hypermetabolic cluster right paratracheal adenopathy with maximum SUV up to 12.  No findings of metastatic disease to the neck/abdomen/pelvis or  skeleton.  # 10/03/2000, MRI brain with and without contrast showed no evidence of intracranial metastatic disease. 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  # - NGS showed PD-L1 TPS 95%, LRP1B S948fs, PIK3CA E545K, RB1 c1422-2A>T, RIT1 T83_ A84del, STK11 P247fs, TPS53 R248W, TMB 19.70mut/mb, MS stable.   #10/10/2020 started concurrent chemoradiation.  Patient received 4 cycles of carboplatin AUC of 2 and Taxol 45 mg per metered square  Additional chemotherapy was held due to thrombocytopenia, neutropenia Patient finished radiation on 11/27/2020 INTERVAL HISTORY Joshua [name] is a 65 y.o. male who has above history reviewed by me today presents for follow up visit for Stage III lung squamous cell carcinoma.  He reports feeling well. Denies sob, nausea or chest pain.  He has appt with cardiology    Review of Systems  Constitutional:  Negative for appetite change, chills, fatigue, fever and unexpected weight change.  HENT:   Negative for hearing loss and voice change.   Eyes:  Negative for eye problems and icterus.  Respiratory:  Negative for chest tightness, cough, hemoptysis and shortness of breath.   Cardiovascular:  Negative for chest pain and leg swelling.  Gastrointestinal:  Negative for abdominal distention and abdominal pain.  Endocrine: Negative for hot flashes.  Genitourinary:  Negative for difficulty urinating, dysuria and frequency.   Musculoskeletal:  Negative for arthralgias.  Skin:  Negative for itching and rash.  Neurological:  Negative for light-headedness and numbness.  Hematological:  Negative for adenopathy. Does not bruise/bleed easily.  Psychiatric/Behavioral:  Negative for confusion.    MEDICAL HISTORY:  Past Medical History:  Diagnosis Date   Allergy  Cancer ([place])    lung   Dyspnea    former smoker. only doe   History of kidney stones    Hypertension     Peripheral vascular disease ([place])    Pre-diabetes    Sleep apnea     SURGICAL HISTORY: Past Surgical History:  Procedure Laterality Date   ANTERIOR CERVICAL DECOMP/DISCECTOMY FUSION N/A 03/14/2020   Procedure: ANTERIOR CERVICAL DECOMPRESSION/DISCECTOMY FUSION 3 LEVELS C4-7;  Surgeon: Meade Maw, MD;  Location: [facility];  Service: Neurosurgery;  Laterality: N/A;   BACK SURGERY  2011   CENTRAL LINE INSERTION Right 09/10/2016   Procedure: CENTRAL LINE INSERTION;  Surgeon: Katha Cabal, MD;  Location: [facility];  Service: Vascular;  Laterality: Right;   CERVICAL FUSION     C4-c7 on Mar 14, 2020   COLONOSCOPY  2013 ?   CORONARY ANGIOPLASTY     FEMORAL-POPLITEAL BYPASS GRAFT Right 09/10/2016   Procedure: BYPASS GRAFT FEMORAL-POPLITEAL ARTERY ( BELOW KNEE );  Surgeon: Katha Cabal, MD;  Location: [facility];  Service: Vascular;  Laterality: Right;   FEMORAL-TIBIAL BYPASS GRAFT Right 03/23/2020   Procedure: BYPASS GRAFT RIGHT FEMORAL- DISTAL TIBIAL ARTERY WITH DISTAL FLOW GRAFT;  Surgeon: Katha Cabal, MD;  Location: [facility];  Service: Vascular;  Laterality: Right;   LOWER EXTREMITY ANGIOGRAPHY Right 11/18/2016   Procedure: Lower Extremity Angiography;  Surgeon: Katha Cabal, MD;  Location: [place] [facility];  Service: Cardiovascular;  Laterality: Right;   LOWER EXTREMITY ANGIOGRAPHY Right 12/09/2016   Procedure: Lower Extremity Angiography;  Surgeon: Katha Cabal, MD;  Location: [place] [facility];  Service: Cardiovascular;  Laterality: Right;   LOWER EXTREMITY ANGIOGRAPHY Right 11/15/2019   Procedure: LOWER EXTREMITY ANGIOGRAPHY;  Surgeon: Katha Cabal, MD;  Location: [place] [facility];  Service: Cardiovascular;  Laterality: Right;   LOWER EXTREMITY ANGIOGRAPHY Right 12/01/2019   Procedure: Lower Extremity Angiography;  Surgeon: Algernon Huxley, MD;  Location: [place] [facility];  Service: Cardiovascular;  Laterality: Right;   LOWER EXTREMITY ANGIOGRAPHY  Right 12/02/2019   Procedure: LOWER EXTREMITY ANGIOGRAPHY;  Surgeon: Katha Cabal, MD;  Location: [place] [facility];  Service: Cardiovascular;  Laterality: Right;   LOWER EXTREMITY ANGIOGRAPHY Right 03/23/2020   Procedure: Lower Extremity Angiography;  Surgeon: Katha Cabal, MD;  Location: [place] [facility];  Service: Cardiovascular;  Laterality: Right;   LOWER EXTREMITY INTERVENTION  11/18/2016   Procedure: Lower Extremity Intervention;  Surgeon: Katha Cabal, MD;  Location: [place] [facility];  Service: Cardiovascular;;   PERIPHERAL VASCULAR CATHETERIZATION Right 01/02/2015   Procedure: Lower Extremity Angiography;  Surgeon: Katha Cabal, MD;  Location: [place] [facility];  Service: Cardiovascular;  Laterality: Right;   PERIPHERAL VASCULAR CATHETERIZATION Right 06/18/2015   Procedure: Lower Extremity Angiography;  Surgeon: Algernon Huxley, MD;  Location: [place] [facility];  Service: Cardiovascular;  Laterality: Right;   PERIPHERAL VASCULAR CATHETERIZATION  06/18/2015   Procedure: Lower Extremity Intervention;  Surgeon: Algernon Huxley, MD;  Location: [place] [facility];  Service: Cardiovascular;;   PERIPHERAL VASCULAR CATHETERIZATION N/A 10/09/2015   Procedure: Abdominal Aortogram w/Lower Extremity;  Surgeon: Katha Cabal, MD;  Location: [place] [facility];  Service: Cardiovascular;  Laterality: N/A;   PERIPHERAL VASCULAR CATHETERIZATION  10/09/2015   Procedure: Lower Extremity Intervention;  Surgeon: Katha Cabal, MD;  Location: [place] [facility];  Service: Cardiovascular;;   PERIPHERAL VASCULAR CATHETERIZATION Right 10/10/2015   Procedure: Lower Extremity Angiography;  Surgeon:  Algernon Huxley, MD;  Location: [place] [facility];  Service: Cardiovascular;  Laterality: Right;   PERIPHERAL VASCULAR CATHETERIZATION Right 10/10/2015   Procedure: Lower Extremity Intervention;  Surgeon: Algernon Huxley, MD;  Location: [place] [facility];  Service: Cardiovascular;   Laterality: Right;   PERIPHERAL VASCULAR CATHETERIZATION Right 12/03/2015   Procedure: Lower Extremity Angiography;  Surgeon: Algernon Huxley, MD;  Location: [place] [facility];  Service: Cardiovascular;  Laterality: Right;   PERIPHERAL VASCULAR CATHETERIZATION Right 12/04/2015   Procedure: Lower Extremity Angiography;  Surgeon: Algernon Huxley, MD;  Location: [place] [facility];  Service: Cardiovascular;  Laterality: Right;   PERIPHERAL VASCULAR CATHETERIZATION  12/04/2015   Procedure: Lower Extremity Intervention;  Surgeon: Algernon Huxley, MD;  Location: [place] [facility];  Service: Cardiovascular;;   PERIPHERAL VASCULAR CATHETERIZATION Right 06/30/2016   Procedure: Lower Extremity Angiography;  Surgeon: Algernon Huxley, MD;  Location: [place] [facility];  Service: Cardiovascular;  Laterality: Right;   PERIPHERAL VASCULAR CATHETERIZATION Right 06/25/2016   Procedure: Lower Extremity Angiography;  Surgeon: Katha Cabal, MD;  Location: [place] [facility];  Service: Cardiovascular;  Laterality: Right;   PERIPHERAL VASCULAR CATHETERIZATION Right 07/01/2016   Procedure: Lower Extremity Angiography;  Surgeon: Katha Cabal, MD;  Location: [place] [facility];  Service: Cardiovascular;  Laterality: Right;   PERIPHERAL VASCULAR CATHETERIZATION  07/01/2016   Procedure: Lower Extremity Intervention;  Surgeon: Katha Cabal, MD;  Location: [place] [facility];  Service: Cardiovascular;;   PERIPHERAL VASCULAR CATHETERIZATION Right 08/01/2016   Procedure: Lower Extremity Angiography;  Surgeon: Katha Cabal, MD;  Location: [place] [facility];  Service: Cardiovascular;  Laterality: Right;   PORTA CATH INSERTION N/A 10/05/2020   Procedure: PORTA CATH INSERTION;  Surgeon: Katha Cabal, MD;  Location: [place] [facility];  Service: Cardiovascular;  Laterality: N/A;   stent placement in right leg Right    VIDEO BRONCHOSCOPY N/A 09/14/2020   Procedure: VIDEO BRONCHOSCOPY WITH FLUORO;  Surgeon:  Tyler Pita, MD;  Location: [facility];  Service: Cardiopulmonary;  Laterality: N/A;   VIDEO BRONCHOSCOPY WITH ENDOBRONCHIAL ULTRASOUND N/A 09/14/2020   Procedure: VIDEO BRONCHOSCOPY WITH ENDOBRONCHIAL ULTRASOUND;  Surgeon: Tyler Pita, MD;  Location: [facility];  Service: Cardiopulmonary;  Laterality: N/A;    SOCIAL HISTORY: Social History   Socioeconomic History   Marital status: Single    Spouse name: Not on file   Number of children: Not on file   Years of education: Not on file   Highest education level: Not on file  Occupational History   Occupation: unemployed  Tobacco Use   Smoking status: Former    Packs/day: 2.00    Years: 38.00    Pack years: 76.00    Types: Cigarettes    Quit date: 12/20/2014    Years since quitting: 6.1   Smokeless tobacco: Former   Tobacco comments:    quit smoking in 2017 most smoked 2   Vaping Use   Vaping Use: Never used  Substance and Sexual Activity   Alcohol use: Not Currently   Drug use: No   Sexual activity: Yes  Other Topics Concern   Not on file  Social History Narrative   Not on file   Social Determinants of Health   Financial Resource Strain: Not on file  Food Insecurity: Not on file  Transportation Needs: Not on file  Physical Activity: Not on file  Stress: Not on file  Social Connections: Not on  file  Intimate Partner Violence: Not on file    FAMILY HISTORY: Family History  Problem Relation Age of Onset   Diabetes Mother    Hypertension Mother    Heart murmur Mother    Leukemia Mother    Throat cancer Maternal Grandmother    Colon cancer Neg Hx    Breast cancer Neg Hx     ALLERGIES:  is allergic to tape.  MEDICATIONS:  Current Outpatient Medications  Medication Sig Dispense Refill   acetaminophen (TYLENOL) 325 MG tablet Take 650 mg by mouth every 6 (six) hours as needed for moderate pain.     clopidogrel (PLAVIX) 75 MG tablet Take 1 tablet (75 mg total) by mouth daily at 6 (six) AM. 60 tablet 1    ELIQUIS 5 MG TABS tablet TAKE 1 TABLET BY MOUTH  TWICE DAILY 180 tablet 3   gabapentin (NEURONTIN) 300 MG capsule Take 2 capsules (600 mg total) by mouth 3 (three) times daily. (Patient taking differently: Take 900 mg by mouth at bedtime as needed (pain).) 540 capsule 1   lidocaine-prilocaine (EMLA) cream Apply to affected area once 30 g 3   lisinopril (ZESTRIL) 10 MG tablet TAKE 1 TABLET BY MOUTH  DAILY 90 tablet 0   Multiple Vitamin (MULTIVITAMIN) tablet Take 1 tablet by mouth daily.     simvastatin (ZOCOR) 40 MG tablet TAKE 1 TABLET BY MOUTH AT  BEDTIME 90 tablet 0   No current facility-administered medications for this visit.     PHYSICAL EXAMINATION: ECOG PERFORMANCE STATUS: 1 - Symptomatic but completely ambulatory Vitals:   01/24/21 1323  BP: 106/67  Pulse: 90  Resp: 18  Temp: 98.1 F (36.7 C)  SpO2: 99%   Filed Weights   01/24/21 1323  Weight: 196 lb 10.4 oz (89.2 kg)    Physical Exam Constitutional:      General: He is not in acute distress.    Comments: Walks with a cane.   HENT:     Head: Normocephalic and atraumatic.  Eyes:     General: No scleral icterus. Cardiovascular:     Rate and Rhythm: Normal rate and regular rhythm.     Heart sounds: Normal heart sounds.  Pulmonary:     Effort: Pulmonary effort is normal. No respiratory distress.     Breath sounds: No wheezing.  Abdominal:     General: Bowel sounds are normal. There is no distension.     Palpations: Abdomen is soft.  Musculoskeletal:        General: No deformity. Normal range of motion.     Cervical back: Normal range of motion and neck supple.  Skin:    General: Skin is warm and dry.     Findings: No erythema or rash.  Neurological:     Mental Status: He is alert and oriented to person, place, and time. Mental status is at baseline.     Cranial Nerves: No cranial nerve deficit.     Coordination: Coordination normal.  Psychiatric:        Mood and Affect: Mood normal.    LABORATORY DATA:   I have reviewed the data as listed Lab Results  Component Value Date   WBC 5.7 01/24/2021   HGB 12.3 (L) 01/24/2021   HCT 37.0 (L) 01/24/2021   MCV 84.7 01/24/2021   PLT 229 01/24/2021   Recent Labs    03/28/20 0421 03/29/20 0444 03/30/20 0536 04/16/20 1525 08/25/20 1512 12/31/20 0817 01/14/21 0851 01/24/21 1300  NA 133* 139 140  --    < >  138 135 139  K 4.1 4.0 4.1  --    < > 3.6 4.0 3.6  CL 101 103 102  --    < > 102 98 104  CO2 $Re'28 28 28  'zTh$ --    < > $R'26 26 27  'mB$ GLUCOSE 116* 101* 99  --    < > 150* 157* 120*  BUN $Re'11 12 12  'uox$ --    < > $R'18 20 11  'aP$ CREATININE 0.76 0.85 0.76  --    < > 1.05 1.04 0.95  CALCIUM 8.2* 8.4* 8.6*  --    < > 9.0 9.3 9.2  GFRNONAA >60 >60 >60  --    < > >60 >60 >60  GFRAA >60 >60 >60  --   --   --   --   --   PROT  --   --   --  7.0   < > 7.1 7.2 7.3  ALBUMIN  --   --   --  4.3   < > 4.0 3.9 3.9  AST  --   --   --  17   < > 25 36 26  ALT  --   --   --  19   < > 20 40 35  ALKPHOS  --   --   --  123*   < > 75 92 90  BILITOT  --   --   --  0.4   < > 0.4 0.7 0.5  BILIDIR  --   --   --  0.10  --   --   --   --    < > = values in this interval not displayed.    Iron/TIBC/Ferritin/ %Sat    Component Value Date/Time   IRON 48 08/30/2020 1052   TIBC 391 08/30/2020 1052   FERRITIN 25 08/30/2020 1052   IRONPCTSAT 12 (L) 08/30/2020 1052      RADIOGRAPHIC STUDIES: I have personally reviewed the radiological images as listed and agreed with the findings in the report. DG Chest 2 View  Result Date: 01/14/2021 CLINICAL DATA:  Tachycardia.  History of lung cancer. EXAM: CHEST - 2 VIEW COMPARISON:  09/14/2020 and CT of the chest on 12/25/2020 FINDINGS: Patient has RIGHT-sided PowerPort, tip to the superior vena cava. There is no pneumothorax. Cardiac mediastinal silhouette is normal. There is minimal focal patchy opacity in the RIGHT UPPER lobe, consistent with atelectasis or early infiltrate. No frank consolidations. No evidence for pulmonary edema. No pleural  effusions. Remote cervical spine fusion. IMPRESSION: 1. RIGHT-sided power port. 2. RIGHT UPPER lobe infiltrate or atelectasis. Electronically Signed   By: Nolon Nations M.D.   On: 01/14/2021 11:16       ASSESSMENT & PLAN:  1. Squamous cell carcinoma of right lung ([place])   2. Encounter for antineoplastic immunotherapy   3. Port-A-Cath in place   4. Elevated serum free T4 level   Cancer Staging Squamous cell carcinoma of right lung [facility]) Staging form: Lung, AJCC 8th Edition - Clinical stage from 09/20/2020: Stage IIIA (cT1b, cN2, cM0) - Signed by [name], MD on 09/20/2020   #Squamous cell carcinoma of right lung, stage IIIA, Post concurrent chemotherapy radiation CT scan shows treatment response. Labs are reviewed and discussed with patient. Proceed with Durvalumab treatment today.   #Tachycardia, nausea, intermittent chest pain.  He is a former smoker, history of peripheral vascular disease. Symptoms have resolved.  He is going to establish care with cardiology  #Elevated  T4 and suppressed TSH prior to immunotherapy.  Repeat thyroid function is pending.   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  Management per ENT  All questions were answered. The patient knows to call the clinic with any problems questions or concerns.  cc [name], MD    Return of visit: 2 weeks [name], MD, PhD Hematology Oncology [facility] Pager- [phone] 01/24/2021

## 2021-01-24 NOTE — Progress Notes (Signed)
Oncology follow up. New onset of tachycardia. Has appt with Dr Clayborn Bigness ,his heart doctor, on July 14th. No other concerns today.

## 2021-01-25 LAB — T4: T4, Total: 5.2 ug/dL (ref 4.5–12.0)

## 2021-01-30 ENCOUNTER — Encounter (INDEPENDENT_AMBULATORY_CARE_PROVIDER_SITE_OTHER): Payer: Self-pay | Admitting: Nurse Practitioner

## 2021-01-30 NOTE — Progress Notes (Signed)
Subjective:    Patient ID: Joshua [name], male    DOB: 07/08/1956, 65 y.o.   MRN: [id number] Chief Complaint  Patient presents with   Follow-up    3 month ultrasound follow up    The patient returns to the office for followup and review of the noninvasive studies. There have been no interval changes in lower extremity symptoms. No interval shortening of the patient's claudication distance or development of rest pain symptoms. No new ulcers or wounds have occurred since the last visit.  There have been no significant changes to the patient's overall health care.  The patient denies amaurosis fugax or recent TIA symptoms. There are no recent neurological changes noted. The patient denies history of DVT, PE or superficial thrombophlebitis. The patient denies recent episodes of angina or shortness of breath.   ABI Rt=1.09 and Lt=1.10  (previous ABI's Rt=1.05 and Lt=1.19) Duplex ultrasound of the bilateral tibial arteries show triphasic tibial artery waveforms with good toe waveforms bilaterally   Review of Systems  Musculoskeletal:  Positive for gait problem.  Neurological:  Positive for numbness.  All other systems reviewed and are negative.     Objective:   Physical Exam Vitals reviewed.  HENT:     Head: Normocephalic.  Cardiovascular:     Rate and Rhythm: Normal rate.     Pulses: Normal pulses.  Pulmonary:     Effort: Pulmonary effort is normal.  Neurological:     Mental Status: He is alert and oriented to person, place, and time.     Gait: Gait abnormal.  Psychiatric:        Mood and Affect: Mood normal.        Behavior: Behavior normal.        Thought Content: Thought content normal.        Judgment: Judgment normal.    BP 112/66 (BP Location: Right Arm)   Pulse (!) 114   Resp 16   Wt 193 lb (87.5 kg)   BMI 27.69 kg/m   Past Medical History:  Diagnosis Date   Allergy    Cancer ([place])    lung   Dyspnea    former smoker. only doe   History of kidney  stones    Hypertension    Peripheral vascular disease ([place])    Pre-diabetes    Sleep apnea     Social History   Socioeconomic History   Marital status: Single    Spouse name: Not on file   Number of children: Not on file   Years of education: Not on file   Highest education level: Not on file  Occupational History   Occupation: unemployed  Tobacco Use   Smoking status: Former    Packs/day: 2.00    Years: 38.00    Pack years: 76.00    Types: Cigarettes    Quit date: 12/20/2014    Years since quitting: 6.1   Smokeless tobacco: Former   Tobacco comments:    quit smoking in 2017 most smoked 2   Vaping Use   Vaping Use: Never used  Substance and Sexual Activity   Alcohol use: Not Currently   Drug use: No   Sexual activity: Yes  Other Topics Concern   Not on file  Social History Narrative   Not on file   Social Determinants of Health   Financial Resource Strain: Not on file  Food Insecurity: Not on file  Transportation Needs: Not on file  Physical Activity: Not on file  Stress: Not on file  Social Connections: Not on file  Intimate Partner Violence: Not on file    Past Surgical History:  Procedure Laterality Date   ANTERIOR CERVICAL DECOMP/DISCECTOMY FUSION N/A 03/14/2020   Procedure: ANTERIOR CERVICAL DECOMPRESSION/DISCECTOMY FUSION 3 LEVELS C4-7;  Surgeon: Meade Maw, MD;  Location: [facility];  Service: Neurosurgery;  Laterality: N/A;   BACK SURGERY  2011   CENTRAL LINE INSERTION Right 09/10/2016   Procedure: CENTRAL LINE INSERTION;  Surgeon: Katha Cabal, MD;  Location: [facility];  Service: Vascular;  Laterality: Right;   CERVICAL FUSION     C4-c7 on Mar 14, 2020   COLONOSCOPY  2013 ?   CORONARY ANGIOPLASTY     FEMORAL-POPLITEAL BYPASS GRAFT Right 09/10/2016   Procedure: BYPASS GRAFT FEMORAL-POPLITEAL ARTERY ( BELOW KNEE );  Surgeon: Katha Cabal, MD;  Location: [facility];  Service: Vascular;  Laterality: Right;   FEMORAL-TIBIAL BYPASS GRAFT  Right 03/23/2020   Procedure: BYPASS GRAFT RIGHT FEMORAL- DISTAL TIBIAL ARTERY WITH DISTAL FLOW GRAFT;  Surgeon: Katha Cabal, MD;  Location: [facility];  Service: Vascular;  Laterality: Right;   LOWER EXTREMITY ANGIOGRAPHY Right 11/18/2016   Procedure: Lower Extremity Angiography;  Surgeon: Katha Cabal, MD;  Location: [place] [facility];  Service: Cardiovascular;  Laterality: Right;   LOWER EXTREMITY ANGIOGRAPHY Right 12/09/2016   Procedure: Lower Extremity Angiography;  Surgeon: Katha Cabal, MD;  Location: [place] [facility];  Service: Cardiovascular;  Laterality: Right;   LOWER EXTREMITY ANGIOGRAPHY Right 11/15/2019   Procedure: LOWER EXTREMITY ANGIOGRAPHY;  Surgeon: Katha Cabal, MD;  Location: [place] [facility];  Service: Cardiovascular;  Laterality: Right;   LOWER EXTREMITY ANGIOGRAPHY Right 12/01/2019   Procedure: Lower Extremity Angiography;  Surgeon: Algernon Huxley, MD;  Location: [place] [facility];  Service: Cardiovascular;  Laterality: Right;   LOWER EXTREMITY ANGIOGRAPHY Right 12/02/2019   Procedure: LOWER EXTREMITY ANGIOGRAPHY;  Surgeon: Katha Cabal, MD;  Location: [place] [facility];  Service: Cardiovascular;  Laterality: Right;   LOWER EXTREMITY ANGIOGRAPHY Right 03/23/2020   Procedure: Lower Extremity Angiography;  Surgeon: Katha Cabal, MD;  Location: [place] [facility];  Service: Cardiovascular;  Laterality: Right;   LOWER EXTREMITY INTERVENTION  11/18/2016   Procedure: Lower Extremity Intervention;  Surgeon: Katha Cabal, MD;  Location: [place] [facility];  Service: Cardiovascular;;   PERIPHERAL VASCULAR CATHETERIZATION Right 01/02/2015   Procedure: Lower Extremity Angiography;  Surgeon: Katha Cabal, MD;  Location: [place] [facility];  Service: Cardiovascular;  Laterality: Right;   PERIPHERAL VASCULAR CATHETERIZATION Right 06/18/2015   Procedure: Lower Extremity Angiography;  Surgeon: Algernon Huxley, MD;  Location: [place] [facility];  Service: Cardiovascular;  Laterality: Right;   PERIPHERAL VASCULAR CATHETERIZATION  06/18/2015   Procedure: Lower Extremity Intervention;  Surgeon: Algernon Huxley, MD;  Location: [place] [facility];  Service: Cardiovascular;;   PERIPHERAL VASCULAR CATHETERIZATION N/A 10/09/2015   Procedure: Abdominal Aortogram w/Lower Extremity;  Surgeon: Katha Cabal, MD;  Location: [place] [facility];  Service: Cardiovascular;  Laterality: N/A;   PERIPHERAL VASCULAR CATHETERIZATION  10/09/2015   Procedure: Lower Extremity Intervention;  Surgeon: Katha Cabal, MD;  Location: [place] [facility];  Service: Cardiovascular;;   PERIPHERAL VASCULAR CATHETERIZATION Right 10/10/2015   Procedure: Lower Extremity Angiography;  Surgeon: Algernon Huxley, MD;  Location: [place] [facility];  Service: Cardiovascular;  Laterality: Right;   PERIPHERAL VASCULAR CATHETERIZATION Right 10/10/2015   Procedure: Lower Extremity  Intervention;  Surgeon: Algernon Huxley, MD;  Location: [place] [facility];  Service: Cardiovascular;  Laterality: Right;   PERIPHERAL VASCULAR CATHETERIZATION Right 12/03/2015   Procedure: Lower Extremity Angiography;  Surgeon: Algernon Huxley, MD;  Location: [place] [facility];  Service: Cardiovascular;  Laterality: Right;   PERIPHERAL VASCULAR CATHETERIZATION Right 12/04/2015   Procedure: Lower Extremity Angiography;  Surgeon: Algernon Huxley, MD;  Location: [place] [facility];  Service: Cardiovascular;  Laterality: Right;   PERIPHERAL VASCULAR CATHETERIZATION  12/04/2015   Procedure: Lower Extremity Intervention;  Surgeon: Algernon Huxley, MD;  Location: [place] [facility];  Service: Cardiovascular;;   PERIPHERAL VASCULAR CATHETERIZATION Right 06/30/2016   Procedure: Lower Extremity Angiography;  Surgeon: Algernon Huxley, MD;  Location: [place] [facility];  Service: Cardiovascular;  Laterality: Right;   PERIPHERAL VASCULAR CATHETERIZATION Right 06/25/2016   Procedure: Lower Extremity Angiography;  Surgeon:  Katha Cabal, MD;  Location: [place] [facility];  Service: Cardiovascular;  Laterality: Right;   PERIPHERAL VASCULAR CATHETERIZATION Right 07/01/2016   Procedure: Lower Extremity Angiography;  Surgeon: Katha Cabal, MD;  Location: [place] [facility];  Service: Cardiovascular;  Laterality: Right;   PERIPHERAL VASCULAR CATHETERIZATION  07/01/2016   Procedure: Lower Extremity Intervention;  Surgeon: Katha Cabal, MD;  Location: [place] [facility];  Service: Cardiovascular;;   PERIPHERAL VASCULAR CATHETERIZATION Right 08/01/2016   Procedure: Lower Extremity Angiography;  Surgeon: Katha Cabal, MD;  Location: [place] [facility];  Service: Cardiovascular;  Laterality: Right;   PORTA CATH INSERTION N/A 10/05/2020   Procedure: PORTA CATH INSERTION;  Surgeon: Katha Cabal, MD;  Location: [place] [facility];  Service: Cardiovascular;  Laterality: N/A;   stent placement in right leg Right    VIDEO BRONCHOSCOPY N/A 09/14/2020   Procedure: VIDEO BRONCHOSCOPY WITH FLUORO;  Surgeon: Tyler Pita, MD;  Location: [facility];  Service: Cardiopulmonary;  Laterality: N/A;   VIDEO BRONCHOSCOPY WITH ENDOBRONCHIAL ULTRASOUND N/A 09/14/2020   Procedure: VIDEO BRONCHOSCOPY WITH ENDOBRONCHIAL ULTRASOUND;  Surgeon: Tyler Pita, MD;  Location: [facility];  Service: Cardiopulmonary;  Laterality: N/A;    Family History  Problem Relation Age of Onset   Diabetes Mother    Hypertension Mother    Heart murmur Mother    Leukemia Mother    Throat cancer Maternal Grandmother    Colon cancer Neg Hx    Breast cancer Neg Hx     Allergies  Allergen Reactions   Tape     [place] Dressing tape- burns his skin     CBC Latest Ref Rng & Units 01/24/2021 01/14/2021 12/31/2020  WBC 4.0 - 10.5 K/uL 5.7 5.1 5.5  Hemoglobin 13.0 - 17.0 g/dL 12.3(L) 11.8(L) 11.5(L)  Hematocrit 39.0 - 52.0 % 37.0(L) 36.1(L) 35.6(L)  Platelets 150 - 400 K/uL 229 193 165      CMP     Component Value  Date/Time   NA 139 01/24/2021 1300   NA 141 10/14/2019 1101   K 3.6 01/24/2021 1300   CL 104 01/24/2021 1300   CO2 27 01/24/2021 1300   GLUCOSE 120 (H) 01/24/2021 1300   BUN 11 01/24/2021 1300   BUN 15 10/14/2019 1101   BUN 16 07/03/2014 1205   CREATININE 0.95 01/24/2021 1300   CREATININE 1.34 (H) 07/03/2014 1205   CALCIUM 9.2 01/24/2021 1300   PROT 7.3 01/24/2021 1300   PROT 7.0 04/16/2020 1525   ALBUMIN 3.9 01/24/2021 1300   ALBUMIN 4.3 04/16/2020 1525  AST 26 01/24/2021 1300   ALT 35 01/24/2021 1300   ALKPHOS 90 01/24/2021 1300   BILITOT 0.5 01/24/2021 1300   BILITOT 0.4 04/16/2020 1525   GFRNONAA >60 01/24/2021 1300   GFRNONAA 59 (L) 07/03/2014 1205   GFRAA >60 03/30/2020 0536   GFRAA >60 07/03/2014 1205     ABI WITH/WO TBI  Result Date: 01/28/2021  LOWER EXTREMITY DOPPLER STUDY Patient Name:  EUGEAN ARNOTT  Date of Exam:   01/17/2021 Medical Rec #: [id number]            Accession #:    [id number] Date of Birth: June 19, 1956           Patient Gender: M Patient Age:   64Y Exam Location:  [facility] Procedure:      VAS [place] ABI WITH/WO TBI Referring Phys: [id number] [place] --------------------------------------------------------------------------------  Indications: Routine f/u. Other Factors: ASO s/p multiple interventions and bypass.                11/15/2019 Right thrombectomy of Fempop insitu graft with added                stent throughout.                 12/01/2019: Aortogram and Selctive Right Lower Extremity                Angiogram. 6 mg of TPA delivered in a lysis catheterfrom the                Origin of the Right Femoral -Popliteal bypass down the proximal                ATA with placement of the lysis.                 12/02/2019: PTA of the Right Posterior Tibial Artery. PTA of the                Right Peroneal Artery. Mechanical Thrombectomy of the Right                Femoral -Popliteal Bypass graft. Mechanical Thrombectomy of the                Right  Tibioperoneal Trunk and Posterior Tibial Artery.  Vascular Interventions: Hx of multiple right lower extremity PTA's/stents.                         09/10/2016 Right femoral to below the knee popliteal                         artery bypass graft placement. 5/01 Right bypass graft &                         mid popliteal artery PTA & coil embolectomy of large                         branch of bypass graft. 12/09/16 Right distal bypass                         graft PTA/stent and right posterior tibial artery PTA.                         03/23/2020 Rt PTA thrombectomy. Rt Profunda fem artery to  PTA bypass. Comparison Study: 10/08/2020 Performing Technologist: Charlane Ferretti RT (R)(VS)  Examination Guidelines: A complete evaluation includes at minimum, Doppler waveform signals and systolic blood pressure reading at the level of bilateral brachial, anterior tibial, and posterior tibial arteries, when vessel segments are accessible. Bilateral testing is considered an integral part of a complete examination. Photoelectric Plethysmograph (PPG) waveforms and toe systolic pressure readings are included as required and additional duplex testing as needed. Limited examinations for reoccurring indications may be performed as noted.  ABI Findings: +---------+------------------+-----+---------+--------+ Right    Rt Pressure (mmHg)IndexWaveform Comment  +---------+------------------+-----+---------+--------+ Brachial 141                                      +---------+------------------+-----+---------+--------+ ATA      145               1.03 triphasic         +---------+------------------+-----+---------+--------+ PTA      153               1.09 triphasic         +---------+------------------+-----+---------+--------+ Great Toe100               0.71 Dampened          +---------+------------------+-----+---------+--------+ +---------+------------------+-----+---------+-------+ Left      Lt Pressure (mmHg)IndexWaveform Comment +---------+------------------+-----+---------+-------+ Brachial 135                                     +---------+------------------+-----+---------+-------+ ATA      155               1.10 triphasic        +---------+------------------+-----+---------+-------+ PTA      143               1.01 triphasic        +---------+------------------+-----+---------+-------+ Great Toe98                0.70 Normal           +---------+------------------+-----+---------+-------+ +-------+-----------+-----------+------------+------------+ ABI/TBIToday's ABIToday's TBIPrevious ABIPrevious TBI +-------+-----------+-----------+------------+------------+ Right  1.09       .71        1.05        .73          +-------+-----------+-----------+------------+------------+ Left   1.10       .70        1.19        1.06         +-------+-----------+-----------+------------+------------+ Bilateral ABIs appear essentially unchanged compared to prior study on 10/08/2020. Bilateral TBIs appear essentially unchanged compared to prior study on 10/08/2020.  Summary: Right: Resting right ankle-brachial index is within normal range. No evidence of significant right lower extremity arterial disease. The right toe-brachial index is normal. Left: Resting left ankle-brachial index is within normal range. No evidence of significant left lower extremity arterial disease. The left toe-brachial index is normal. *See table(s) above for measurements and observations.  Electronically signed by [name] MD on 01/28/2021 at 1:12:33 PM.    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  The patient does not voice lifestyle limiting changes at this point in time.  Noninvasive studies  do not suggest clinically  significant change.  No invasive studies, angiography or surgery at this time The patient should continue walking and begin a more formal exercise program.  The patient should continue antiplatelet therapy and aggressive treatment of the lipid abnormalities  No changes in the patient's medications at this time  The patient should continue wearing graduated compression socks 10-15 mmHg strength to control the mild edema.  Patient will follow-up in 6 months.  2. Mixed hyperlipidemia Continue statin as ordered and reviewed, no changes at this time   3. Essential hypertension Continue antihypertensive medications as already ordered, these medications have been reviewed and there are no changes at this time.    Current Outpatient Medications on File Prior to Visit  Medication Sig Dispense Refill   acetaminophen (TYLENOL) 325 MG tablet Take 650 mg by mouth every 6 (six) hours as needed for moderate pain.     clopidogrel (PLAVIX) 75 MG tablet Take 1 tablet (75 mg total) by mouth daily at 6 (six) AM. 60 tablet 1   ELIQUIS 5 MG TABS tablet TAKE 1 TABLET BY MOUTH  TWICE DAILY 180 tablet 3   gabapentin (NEURONTIN) 300 MG capsule Take 2 capsules (600 mg total) by mouth 3 (three) times daily. (Patient taking differently: Take 900 mg by mouth at bedtime as needed (pain).) 540 capsule 1   lidocaine-prilocaine (EMLA) cream Apply to affected area once 30 g 3   lisinopril (ZESTRIL) 10 MG tablet TAKE 1 TABLET BY MOUTH  DAILY 90 tablet 0   Multiple Vitamin (MULTIVITAMIN) tablet Take 1 tablet by mouth daily.     simvastatin (ZOCOR) 40 MG tablet TAKE 1 TABLET BY MOUTH AT  BEDTIME 90 tablet 0   No current facility-administered medications on file prior to visit.    There are no Patient Instructions on file for this visit. No follow-ups on file.   Kris Hartmann, NP

## 2021-02-04 ENCOUNTER — Ambulatory Visit: Payer: Medicare Other

## 2021-02-07 ENCOUNTER — Encounter: Payer: Self-pay | Admitting: Oncology

## 2021-02-07 ENCOUNTER — Other Ambulatory Visit: Payer: Self-pay

## 2021-02-07 ENCOUNTER — Inpatient Hospital Stay: Payer: Medicare Other

## 2021-02-07 ENCOUNTER — Inpatient Hospital Stay (HOSPITAL_BASED_OUTPATIENT_CLINIC_OR_DEPARTMENT_OTHER): Payer: Medicare Other | Admitting: Oncology

## 2021-02-07 VITALS — BP 99/62 | HR 76

## 2021-02-07 VITALS — BP 101/59 | HR 72 | Temp 97.0°F | Resp 18 | Wt 200.8 lb

## 2021-02-07 DIAGNOSIS — I9589 Other hypotension: Secondary | ICD-10-CM

## 2021-02-07 DIAGNOSIS — C3491 Malignant neoplasm of unspecified part of right bronchus or lung: Secondary | ICD-10-CM | POA: Diagnosis not present

## 2021-02-07 DIAGNOSIS — Z5112 Encounter for antineoplastic immunotherapy: Secondary | ICD-10-CM

## 2021-02-07 DIAGNOSIS — E861 Hypovolemia: Secondary | ICD-10-CM | POA: Diagnosis not present

## 2021-02-07 LAB — COMPREHENSIVE METABOLIC PANEL
ALT: 24 U/L (ref 0–44)
AST: 40 U/L (ref 15–41)
Albumin: 3.9 g/dL (ref 3.5–5.0)
Alkaline Phosphatase: 75 U/L (ref 38–126)
Anion gap: 8 (ref 5–15)
BUN: 18 mg/dL (ref 8–23)
CO2: 26 mmol/L (ref 22–32)
Calcium: 9.2 mg/dL (ref 8.9–10.3)
Chloride: 105 mmol/L (ref 98–111)
Creatinine, Ser: 1.23 mg/dL (ref 0.61–1.24)
GFR, Estimated: >60 mL/min (ref >60)
Glucose, Bld: 131 mg/dL — ABNORMAL HIGH (ref 70–99)
Potassium: 3.8 mmol/L (ref 3.5–5.1)
Sodium: 139 mmol/L (ref 135–145)
Total Bilirubin: 0.6 mg/dL (ref 0.3–1.2)
Total Protein: 6.8 g/dL (ref 6.5–8.1)

## 2021-02-07 LAB — CBC WITH DIFFERENTIAL/PLATELET
Abs Immature Granulocytes: 0 10*3/uL (ref 0.00–0.07)
Basophils Absolute: 0.1 10*3/uL (ref 0.0–0.1)
Basophils Relative: 1 %
Eosinophils Absolute: 0.3 10*3/uL (ref 0.0–0.5)
Eosinophils Relative: 7 %
HCT: 36.4 % — ABNORMAL LOW (ref 39.0–52.0)
Hemoglobin: 12.2 g/dL — ABNORMAL LOW (ref 13.0–17.0)
Immature Granulocytes: 0 %
Lymphocytes Relative: 18 %
Lymphs Abs: 0.9 10*3/uL (ref 0.7–4.0)
MCH: 29 pg (ref 26.0–34.0)
MCHC: 33.5 g/dL (ref 30.0–36.0)
MCV: 86.5 fL (ref 80.0–100.0)
Monocytes Absolute: 0.4 10*3/uL (ref 0.1–1.0)
Monocytes Relative: 8 %
Neutro Abs: 3.1 10*3/uL (ref 1.7–7.7)
Neutrophils Relative %: 66 %
Platelets: 176 10*3/uL (ref 150–400)
RBC: 4.21 MIL/uL — ABNORMAL LOW (ref 4.22–5.81)
RDW: 14.7 % (ref 11.5–15.5)
WBC: 4.7 10*3/uL (ref 4.0–10.5)
nRBC: 0 % (ref 0.0–0.2)

## 2021-02-07 LAB — TSH: TSH: 228 u[IU]/mL — ABNORMAL HIGH (ref 0.350–4.500)

## 2021-02-07 LAB — MAGNESIUM: Magnesium: 2.1 mg/dL (ref 1.7–2.4)

## 2021-02-07 MED ORDER — SODIUM CHLORIDE 0.9% FLUSH
10.0000 mL | INTRAVENOUS | Status: DC | PRN
  Administered 2021-02-07: 10 mL
  Filled 2021-02-07: qty 10

## 2021-02-07 MED ORDER — HEPARIN SOD (PORK) LOCK FLUSH 100 UNIT/ML IV SOLN
500.0000 [IU] | Freq: Once | INTRAVENOUS | Status: AC | PRN
  Administered 2021-02-07: 500 [IU]
  Filled 2021-02-07: qty 5

## 2021-02-07 MED ORDER — SODIUM CHLORIDE 0.9 % IV SOLN
Freq: Once | INTRAVENOUS | Status: AC
  Filled 2021-02-07: qty 250

## 2021-02-07 MED ORDER — SODIUM CHLORIDE 0.9 % IV SOLN
10.0000 mg/kg | Freq: Once | INTRAVENOUS | Status: AC
  Administered 2021-02-07: 860 mg via INTRAVENOUS
  Filled 2021-02-07: qty 10

## 2021-02-07 NOTE — Patient Instructions (Signed)
[facility] [place] [facility]  Discharge Instructions: Thank you for choosing [facility] to provide your oncology and hematology care.  If you have a lab appointment with the Cancer Center, please go directly to the Cancer Center and check in at the registration area.  Wear comfortable clothing and clothing appropriate for easy access to any Portacath or PICC line.   We strive to give you quality time with your provider. You may need to reschedule your appointment if you arrive late (15 or more minutes).  Arriving late affects you and other patients whose appointments are after yours.  Also, if you miss three or more appointments without notifying the office, you may be dismissed from the clinic at the provider's discretion.      For prescription refill requests, have your pharmacy contact our office and allow 72 hours for refills to be completed.    Today you received the following chemotherapy and/or immunotherapy agents       To help prevent nausea and vomiting after your treatment, we encourage you to take your nausea medication as directed.  BELOW ARE SYMPTOMS THAT SHOULD BE REPORTED IMMEDIATELY: *FEVER GREATER THAN 100.4 F (38 C) OR HIGHER *CHILLS OR SWEATING *NAUSEA AND VOMITING THAT IS NOT CONTROLLED WITH YOUR NAUSEA MEDICATION *UNUSUAL SHORTNESS OF BREATH *UNUSUAL BRUISING OR BLEEDING *URINARY PROBLEMS (pain or burning when urinating, or frequent urination) *BOWEL PROBLEMS (unusual diarrhea, constipation, pain near the anus) TENDERNESS IN MOUTH AND THROAT WITH OR WITHOUT PRESENCE OF ULCERS (sore throat, sores in mouth, or a toothache) UNUSUAL RASH, SWELLING OR PAIN  UNUSUAL VAGINAL DISCHARGE OR ITCHING   Items with * indicate a potential emergency and should be followed up as soon as possible or go to the Emergency Department if any problems should occur.  Please show the CHEMOTHERAPY ALERT CARD or IMMUNOTHERAPY ALERT CARD at check-in to the Emergency  Department and triage nurse.  Should you have questions after your visit or need to cancel or reschedule your appointment, please contact [facility] [place] [facility]  [phone] and follow the prompts.  Office hours are 8:00 a.m. to 4:30 p.m. Monday - Friday. Please note that voicemails left after 4:00 p.m. may not be returned until the following business day.  We are closed weekends and major holidays. You have access to a nurse at all times for urgent questions. Please call the main number to the clinic [phone] and follow the prompts.  For any non-urgent questions, you may also contact your provider using MyChart. We now offer e-Visits for anyone 18 and older to request care online for non-urgent symptoms. For details visit [email or web address].   Also download the MyChart app! Go to the app store, search "MyChart", open the app, select [facility], and log in with your MyChart username and password.  Due to Covid, a mask is required upon entering the hospital/clinic. If you do not have a mask, one will be given to you upon arrival. For doctor visits, patients may have 1 support person aged 18 or older with them. For treatment visits, patients cannot have anyone with them due to current Covid guidelines and our immunocompromised population.  

## 2021-02-07 NOTE — Progress Notes (Signed)
Patient here for follow up. No new concerns voiced.  °

## 2021-02-07 NOTE — Progress Notes (Signed)
Hematology/Oncology  note [facility] [phone] [phone]   Patient Care Team: [name], MD as PCP - General (Physician Assistant) [name], MD as Referring Physician (Physician Assistant) Bary Castilla, [name], MD (General Surgery) Telford Nab, RN as Oncology Nurse Navigator  REFERRING PROVIDER: [name], MD  CHIEF COMPLAINTS/REASON FOR VISIT:  Follow up for lung cancer  HISTORY OF PRESENTING ILLNESS:   Joshua [name] is a  65 y.o.  male with PMH listed below was seen in consultation at the request of  [name], MD  for evaluation of lung mass  Patient presented to emergency room on 08/25/2020 for evaluation of hemoptysis, coughing.  He coughed up small amount of blood and a large clot.  No fever, chills, chest pain, abdominal pain.  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  Patient is on Plavix and Eliquis.  Since his ER visit, he has not coughed up more blood.  08/25/2020 CT angio chest PE with and without contrast showed ill-defined soft tissue adjacent to the right upper lobe bronchus resulting in narrowing of right upper lobe bronchus.  Also right paratracheal mediastinal adenopathy.  Scattered centrilobular groundglass opacities within the right lower lobe and to a lesser extent within the right lower lobe likely reflecting an infectious or inflammatory process. # 09/14/2020, right upper lobe lung biopsy and bronchoscopy brushing are both positive for squamous cell carcinoma.Right precarinal space lymph node insufficient lymph node sampling for staging. 09/18/2020, PET scan showed right upper lobe nodule maximal SUV 11.5.  Hypermetabolic cluster right paratracheal adenopathy with maximum SUV up to 12.  No findings of metastatic disease to the neck/abdomen/pelvis or  skeleton.  # 10/03/2000, MRI brain with and without contrast showed no evidence of intracranial metastatic disease. 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  # - NGS showed PD-L1 TPS 95%, LRP1B S956f, PIK3CA E545K, RB1 c1422-2A>T, RIT1 T83_ A84del, STK11 P2823f TPS53 R248W, TMB 19.80m29mmb, MS stable.   #10/10/2020 started concurrent chemoradiation.  Patient received 4 cycles of carboplatin AUC of 2 and Taxol 45 mg per metered square  Additional chemotherapy was held due to thrombocytopenia, neutropenia Patient finished radiation on 11/27/2020 INTERVAL HISTORY Joshua [name] a 64 48o. male who has above history reviewed by me today presents for follow up visit for Stage III lung squamous cell carcinoma. Patient reports feeling well.  No nausea vomiting diarrhea.  No shortness of breath more than his baseline level.    Review of Systems  Constitutional:  Negative for appetite change, chills, fatigue, fever and unexpected weight change.  HENT:   Negative for hearing loss and voice change.   Eyes:  Negative for eye problems and icterus.  Respiratory:  Negative for chest tightness, cough, hemoptysis and shortness of breath.   Cardiovascular:  Negative for chest pain and leg swelling.  Gastrointestinal:  Negative for abdominal distention and abdominal pain.  Endocrine: Negative for hot flashes.  Genitourinary:  Negative for difficulty urinating, dysuria and frequency.   Musculoskeletal:  Negative for arthralgias.  Skin:  Negative for itching and rash.  Neurological:  Negative for light-headedness and numbness.  Hematological:  Negative for adenopathy. Does not bruise/bleed easily.  Psychiatric/Behavioral:  Negative for confusion.    MEDICAL HISTORY:  Past Medical History:  Diagnosis Date   Allergy  Cancer ([place])    lung   Dyspnea    former smoker. only doe   History of kidney  stones    Hypertension    Peripheral vascular disease ([place])    Pre-diabetes    Sleep apnea     SURGICAL HISTORY: Past Surgical History:  Procedure Laterality Date   ANTERIOR CERVICAL DECOMP/DISCECTOMY FUSION N/A 03/14/2020   Procedure: ANTERIOR CERVICAL DECOMPRESSION/DISCECTOMY FUSION 3 LEVELS C4-7;  Surgeon: Meade Maw, MD;  Location: [facility] OR[facility];  Service: Neurosurgery;  Laterality: N/A;   BACK SURGERY  2011   CENTRAL LINE INSERTION Right 09/10/2016   Procedure: CENTRAL LINE INSERTION;  Surgeon: Katha Cabal, MD;  Location: [facility];  Service: Vascular;  Laterality: Right;   CERVICAL FUSION     C4-c7 on Mar 14, 2020   COLONOSCOPY  2013 ?   CORONARY ANGIOPLASTY     FEMORAL-POPLITEAL BYPASS GRAFT Right 09/10/2016   Procedure: BYPASS GRAFT FEMORAL-POPLITEAL ARTERY ( BELOW KNEE );  Surgeon: Katha Cabal, MD;  Location: [facility];  Service: Vascular;  Laterality: Right;   FEMORAL-TIBIAL BYPASS GRAFT Right 03/23/2020   Procedure: BYPASS GRAFT RIGHT FEMORAL- DISTAL TIBIAL ARTERY WITH DISTAL FLOW GRAFT;  Surgeon: Katha Cabal, MD;  Location: [facility];  Service: Vascular;  Laterality: Right;   LOWER EXTREMITY ANGIOGRAPHY Right 11/18/2016   Procedure: Lower Extremity Angiography;  Surgeon: Katha Cabal, MD;  Location: [place] [facility];  Service: Cardiovascular;  Laterality: Right;   LOWER EXTREMITY ANGIOGRAPHY Right 12/09/2016   Procedure: Lower Extremity Angiography;  Surgeon: Katha Cabal, MD;  Location: [place] [facility];  Service: Cardiovascular;  Laterality: Right;   LOWER EXTREMITY ANGIOGRAPHY Right 11/15/2019   Procedure: LOWER EXTREMITY ANGIOGRAPHY;  Surgeon: Katha Cabal, MD;  Location: [place] [facility];  Service: Cardiovascular;  Laterality: Right;   LOWER EXTREMITY ANGIOGRAPHY Right 12/01/2019   Procedure: Lower Extremity Angiography;  Surgeon: Algernon Huxley, MD;  Location: [place] [facility];  Service: Cardiovascular;  Laterality: Right;    LOWER EXTREMITY ANGIOGRAPHY Right 12/02/2019   Procedure: LOWER EXTREMITY ANGIOGRAPHY;  Surgeon: Katha Cabal, MD;  Location: [place] [facility];  Service: Cardiovascular;  Laterality: Right;   LOWER EXTREMITY ANGIOGRAPHY Right 03/23/2020   Procedure: Lower Extremity Angiography;  Surgeon: Katha Cabal, MD;  Location: [place] [facility];  Service: Cardiovascular;  Laterality: Right;   LOWER EXTREMITY INTERVENTION  11/18/2016   Procedure: Lower Extremity Intervention;  Surgeon: Katha Cabal, MD;  Location: [place] [facility];  Service: Cardiovascular;;   PERIPHERAL VASCULAR CATHETERIZATION Right 01/02/2015   Procedure: Lower Extremity Angiography;  Surgeon: Katha Cabal, MD;  Location: [place] [facility];  Service: Cardiovascular;  Laterality: Right;   PERIPHERAL VASCULAR CATHETERIZATION Right 06/18/2015   Procedure: Lower Extremity Angiography;  Surgeon: Algernon Huxley, MD;  Location: [place] [facility];  Service: Cardiovascular;  Laterality: Right;   PERIPHERAL VASCULAR CATHETERIZATION  06/18/2015   Procedure: Lower Extremity Intervention;  Surgeon: Algernon Huxley, MD;  Location: [place] [facility];  Service: Cardiovascular;;   PERIPHERAL VASCULAR CATHETERIZATION N/A 10/09/2015   Procedure: Abdominal Aortogram w/Lower Extremity;  Surgeon: Katha Cabal, MD;  Location: [place] [facility];  Service: Cardiovascular;  Laterality: N/A;   PERIPHERAL VASCULAR CATHETERIZATION  10/09/2015   Procedure: Lower Extremity Intervention;  Surgeon: Katha Cabal, MD;  Location: [place] [facility];  Service: Cardiovascular;;   PERIPHERAL VASCULAR CATHETERIZATION Right 10/10/2015   Procedure: Lower Extremity Angiography;  Surgeon:  Algernon Huxley, MD;  Location: [place] [facility];  Service: Cardiovascular;  Laterality: Right;   PERIPHERAL VASCULAR CATHETERIZATION Right 10/10/2015   Procedure: Lower Extremity Intervention;  Surgeon: Algernon Huxley, MD;  Location: [place] [facility];   Service: Cardiovascular;  Laterality: Right;   PERIPHERAL VASCULAR CATHETERIZATION Right 12/03/2015   Procedure: Lower Extremity Angiography;  Surgeon: Algernon Huxley, MD;  Location: [place] [facility];  Service: Cardiovascular;  Laterality: Right;   PERIPHERAL VASCULAR CATHETERIZATION Right 12/04/2015   Procedure: Lower Extremity Angiography;  Surgeon: Algernon Huxley, MD;  Location: [place] [facility];  Service: Cardiovascular;  Laterality: Right;   PERIPHERAL VASCULAR CATHETERIZATION  12/04/2015   Procedure: Lower Extremity Intervention;  Surgeon: Algernon Huxley, MD;  Location: [place] [facility];  Service: Cardiovascular;;   PERIPHERAL VASCULAR CATHETERIZATION Right 06/30/2016   Procedure: Lower Extremity Angiography;  Surgeon: Algernon Huxley, MD;  Location: [place] [facility];  Service: Cardiovascular;  Laterality: Right;   PERIPHERAL VASCULAR CATHETERIZATION Right 06/25/2016   Procedure: Lower Extremity Angiography;  Surgeon: Katha Cabal, MD;  Location: [place] [facility];  Service: Cardiovascular;  Laterality: Right;   PERIPHERAL VASCULAR CATHETERIZATION Right 07/01/2016   Procedure: Lower Extremity Angiography;  Surgeon: Katha Cabal, MD;  Location: [place] [facility];  Service: Cardiovascular;  Laterality: Right;   PERIPHERAL VASCULAR CATHETERIZATION  07/01/2016   Procedure: Lower Extremity Intervention;  Surgeon: Katha Cabal, MD;  Location: [place] [facility];  Service: Cardiovascular;;   PERIPHERAL VASCULAR CATHETERIZATION Right 08/01/2016   Procedure: Lower Extremity Angiography;  Surgeon: Katha Cabal, MD;  Location: [place] [facility];  Service: Cardiovascular;  Laterality: Right;   PORTA CATH INSERTION N/A 10/05/2020   Procedure: PORTA CATH INSERTION;  Surgeon: Katha Cabal, MD;  Location: [place] [facility];  Service: Cardiovascular;  Laterality: N/A;   stent placement in right leg Right    VIDEO BRONCHOSCOPY N/A 09/14/2020   Procedure: VIDEO  BRONCHOSCOPY WITH FLUORO;  Surgeon: Tyler Pita, MD;  Location: [facility];  Service: Cardiopulmonary;  Laterality: N/A;   VIDEO BRONCHOSCOPY WITH ENDOBRONCHIAL ULTRASOUND N/A 09/14/2020   Procedure: VIDEO BRONCHOSCOPY WITH ENDOBRONCHIAL ULTRASOUND;  Surgeon: Tyler Pita, MD;  Location: [facility];  Service: Cardiopulmonary;  Laterality: N/A;    SOCIAL HISTORY: Social History   Socioeconomic History   Marital status: Single    Spouse name: Not on file   Number of children: Not on file   Years of education: Not on file   Highest education level: Not on file  Occupational History   Occupation: unemployed  Tobacco Use   Smoking status: Former    Packs/day: 2.00    Years: 38.00    Pack years: 76.00    Types: Cigarettes    Quit date: 12/20/2014    Years since quitting: 6.1   Smokeless tobacco: Former   Tobacco comments:    quit smoking in 2017 most smoked 2   Vaping Use   Vaping Use: Never used  Substance and Sexual Activity   Alcohol use: Not Currently   Drug use: No   Sexual activity: Yes  Other Topics Concern   Not on file  Social History Narrative   Not on file   Social Determinants of Health   Financial Resource Strain: Not on file  Food Insecurity: Not on file  Transportation Needs: Not on file  Physical Activity: Not on file  Stress: Not on file  Social Connections: Not on  file  Intimate Partner Violence: Not on file    FAMILY HISTORY: Family History  Problem Relation Age of Onset   Diabetes Mother    Hypertension Mother    Heart murmur Mother    Leukemia Mother    Throat cancer Maternal Grandmother    Colon cancer Neg Hx    Breast cancer Neg Hx     ALLERGIES:  is allergic to tape.  MEDICATIONS:  Current Outpatient Medications  Medication Sig Dispense Refill   acetaminophen (TYLENOL) 325 MG tablet Take 650 mg by mouth every 6 (six) hours as needed for moderate pain.     clopidogrel (PLAVIX) 75 MG tablet Take 1 tablet (75 mg total) by mouth  daily at 6 (six) AM. 60 tablet 1   ELIQUIS 5 MG TABS tablet TAKE 1 TABLET BY MOUTH  TWICE DAILY 180 tablet 3   gabapentin (NEURONTIN) 300 MG capsule Take 2 capsules (600 mg total) by mouth 3 (three) times daily. (Patient taking differently: Take 900 mg by mouth at bedtime as needed (pain).) 540 capsule 1   lidocaine-prilocaine (EMLA) cream Apply to affected area once 30 g 3   lisinopril (ZESTRIL) 10 MG tablet TAKE 1 TABLET BY MOUTH  DAILY 90 tablet 0   Multiple Vitamin (MULTIVITAMIN) tablet Take 1 tablet by mouth daily.     simvastatin (ZOCOR) 40 MG tablet TAKE 1 TABLET BY MOUTH AT  BEDTIME 90 tablet 0   No current facility-administered medications for this visit.     PHYSICAL EXAMINATION: ECOG PERFORMANCE STATUS: 1 - Symptomatic but completely ambulatory Vitals:   02/07/21 0851  BP: (!) 101/59  Pulse: 72  Resp: 18  Temp: (!) 97 F (36.1 C)  SpO2: 100%   Filed Weights   02/07/21 0851  Weight: 200 lb 13.4 oz (91.1 kg)    Physical Exam Constitutional:      General: He is not in acute distress.    Comments: Walks with a cane.   HENT:     Head: Normocephalic and atraumatic.  Eyes:     General: No scleral icterus. Cardiovascular:     Rate and Rhythm: Normal rate and regular rhythm.     Heart sounds: Normal heart sounds.  Pulmonary:     Effort: Pulmonary effort is normal. No respiratory distress.     Breath sounds: No wheezing.  Abdominal:     General: Bowel sounds are normal. There is no distension.     Palpations: Abdomen is soft.  Musculoskeletal:        General: No deformity. Normal range of motion.     Cervical back: Normal range of motion and neck supple.  Skin:    General: Skin is warm and dry.     Findings: No erythema or rash.  Neurological:     Mental Status: He is alert and oriented to person, place, and time. Mental status is at baseline.     Cranial Nerves: No cranial nerve deficit.     Coordination: Coordination normal.  Psychiatric:        Mood and  Affect: Mood normal.    LABORATORY DATA:  I have reviewed the data as listed Lab Results  Component Value Date   WBC 4.7 02/07/2021   HGB 12.2 (L) 02/07/2021   HCT 36.4 (L) 02/07/2021   MCV 86.5 02/07/2021   PLT 176 02/07/2021   Recent Labs    03/28/20 0421 03/29/20 0444 03/30/20 0536 04/16/20 1525 08/25/20 1512 12/31/20 0817 01/14/21 0851 01/24/21 1300  NA 133*  139 140  --    < > 138 135 139  K 4.1 4.0 4.1  --    < > 3.6 4.0 3.6  CL 101 103 102  --    < > 102 98 104  CO2 _0 --    < > _1 GLUCOSE 116* 101* 99  --    < > 150* 157* 120*  BUN _2 --    < > _3 CREATININE 0.76 0.85 0.76  --    < > 1.05 1.04 0.95  CALCIUM 8.2* 8.4* 8.6*  --    < > 9.0 9.3 9.2  GFRNONAA >60 >60 >60  --    < > >60 >60 >60  GFRAA >60 >60 >60  --   --   --   --   --   PROT  --   --   --  7.0   < > 7.1 7.2 7.3  ALBUMIN  --   --   --  4.3   < > 4.0 3.9 3.9  AST  --   --   --  17   < > 25 36 26  ALT  --   --   --  19   < > 20 40 35  ALKPHOS  --   --   --  123*   < > 75 92 90  BILITOT  --   --   --  0.4   < > 0.4 0.7 0.5  BILIDIR  --   --   --  0.10  --   --   --   --    < > = values in this interval not displayed.    Iron/TIBC/Ferritin/ %Sat    Component Value Date/Time   IRON 48 08/30/2020 1052   TIBC 391 08/30/2020 1052   FERRITIN 25 08/30/2020 1052   IRONPCTSAT 12 (L) 08/30/2020 1052      RADIOGRAPHIC STUDIES: I have personally reviewed the radiological images as listed and agreed with the findings in the report. DG Chest 2 View  Result Date: 01/14/2021 CLINICAL DATA:  Tachycardia.  History of lung cancer. EXAM: CHEST - 2 VIEW COMPARISON:  09/14/2020 and CT of the chest on 12/25/2020 FINDINGS: Patient has RIGHT-sided PowerPort, tip to the superior vena cava. There is no pneumothorax. Cardiac mediastinal silhouette is normal. There is minimal focal patchy opacity in the RIGHT UPPER lobe, consistent with atelectasis or early infiltrate. No frank consolidations.  No evidence for pulmonary edema. No pleural effusions. Remote cervical spine fusion. IMPRESSION: 1. RIGHT-sided power port. 2. RIGHT UPPER lobe infiltrate or atelectasis. Electronically Signed   By: Nolon Nations M.D.   On: 01/14/2021 11:16   ABI WITH/WO TBI  Result Date: 01/28/2021  LOWER EXTREMITY DOPPLER STUDY Patient Name:  Joshua [name]  Date of Exam:   01/17/2021 Medical Rec #: [id number]            Accession #:    [id number] Date of Birth: 07-Jan-1956           Patient Gender: M Patient Age:   64Y Exam Location:  [facility] Procedure:      VAS [place] ABI WITH/WO TBI Referring Phys: [id number] [place] --------------------------------------------------------------------------------  Indications: Routine f/u. Other Factors: ASO s/p multiple interventions and bypass.                11/15/2019 Right thrombectomy of Fempop insitu  graft with added                stent throughout.                 12/01/2019: Aortogram and Selctive Right Lower Extremity                Angiogram. 6 mg of TPA delivered in a lysis catheterfrom the                Origin of the Right Femoral -Popliteal bypass down the proximal                ATA with placement of the lysis.                 12/02/2019: PTA of the Right Posterior Tibial Artery. PTA of the                Right Peroneal Artery. Mechanical Thrombectomy of the Right                Femoral -Popliteal Bypass graft. Mechanical Thrombectomy of the                Right Tibioperoneal Trunk and Posterior Tibial Artery.  Vascular Interventions: Hx of multiple right lower extremity PTA's/stents.                         09/10/2016 Right femoral to below the knee popliteal                         artery bypass graft placement. 5/01 Right bypass graft &                         mid popliteal artery PTA & coil embolectomy of large                         branch of bypass graft. 12/09/16 Right distal bypass                         graft PTA/stent and right posterior tibial  artery PTA.                         03/23/2020 Rt PTA thrombectomy. Rt Profunda fem artery to                         PTA bypass. Comparison Study: 10/08/2020 Performing Technologist: Charlane Ferretti RT (R)(VS)  Examination Guidelines: A complete evaluation includes at minimum, Doppler waveform signals and systolic blood pressure reading at the level of bilateral brachial, anterior tibial, and posterior tibial arteries, when vessel segments are accessible. Bilateral testing is considered an integral part of a complete examination. Photoelectric Plethysmograph (PPG) waveforms and toe systolic pressure readings are included as required and additional duplex testing as needed. Limited examinations for reoccurring indications may be performed as noted.  ABI Findings: +---------+------------------+-----+---------+--------+ Right    Rt Pressure (mmHg)IndexWaveform Comment  +---------+------------------+-----+---------+--------+ Brachial 141                                      +---------+------------------+-----+---------+--------+ ATA      145  1.03 triphasic         +---------+------------------+-----+---------+--------+ PTA      153               1.09 triphasic         +---------+------------------+-----+---------+--------+ Great Toe100               0.71 Dampened          +---------+------------------+-----+---------+--------+ +---------+------------------+-----+---------+-------+ Left     Lt Pressure (mmHg)IndexWaveform Comment +---------+------------------+-----+---------+-------+ Brachial 135                                     +---------+------------------+-----+---------+-------+ ATA      155               1.10 triphasic        +---------+------------------+-----+---------+-------+ PTA      143               1.01 triphasic        +---------+------------------+-----+---------+-------+ Great Toe98                0.70 Normal            +---------+------------------+-----+---------+-------+ +-------+-----------+-----------+------------+------------+ ABI/TBIToday's ABIToday's TBIPrevious ABIPrevious TBI +-------+-----------+-----------+------------+------------+ Right  1.09       .71        1.05        .73          +-------+-----------+-----------+------------+------------+ Left   1.10       .70        1.19        1.06         +-------+-----------+-----------+------------+------------+ Bilateral ABIs appear essentially unchanged compared to prior study on 10/08/2020. Bilateral TBIs appear essentially unchanged compared to prior study on 10/08/2020.  Summary: Right: Resting right ankle-brachial index is within normal range. No evidence of significant right lower extremity arterial disease. The right toe-brachial index is normal. Left: Resting left ankle-brachial index is within normal range. No evidence of significant left lower extremity arterial disease. The left toe-brachial index is normal. *See table(s) above for measurements and observations.  Electronically signed by [name] MD on 01/28/2021 at 1:12:33 PM.    Final        ASSESSMENT & PLAN:  1. Squamous cell carcinoma of right lung ([place])   2. Encounter for antineoplastic immunotherapy   3. Hypotension due to hypovolemia   Cancer Staging Squamous cell carcinoma of right lung [facility]) Staging form: Lung, AJCC 8th Edition - Clinical stage from 09/20/2020: Stage IIIA (cT1b, cN2, cM0) - Signed by [name], MD on 09/20/2020   #Squamous cell carcinoma of right lung, stage IIIA, Labs are reviewed and discussed with patient Procedure was Durvalumab treatment today.   #Hypertension, patient is blood pressure has been borderline recently.  BP 101/59 today. Recommend patient to increase oral hydration.  Measure blood pressure prior to taking lisinopril.  If SBP is less than 110, he may hold lisinopril.    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  Management  per ENT  All questions were answered. The patient knows to call the clinic with any problems questions or concerns.  cc [name], MD   Return of visit: 2 weeks  [name], MD, PhD Hematology Oncology [facility] Pager- [phone] 02/07/2021

## 2021-02-08 LAB — T4: T4, Total: 1.8 ug/dL — ABNORMAL LOW (ref 4.5–12.0)

## 2021-02-11 ENCOUNTER — Other Ambulatory Visit: Payer: Self-pay

## 2021-02-11 DIAGNOSIS — C3491 Malignant neoplasm of unspecified part of right bronchus or lung: Secondary | ICD-10-CM

## 2021-02-21 ENCOUNTER — Other Ambulatory Visit: Payer: Self-pay

## 2021-02-21 ENCOUNTER — Inpatient Hospital Stay: Payer: Medicare Other

## 2021-02-21 ENCOUNTER — Inpatient Hospital Stay: Payer: Medicare Other | Attending: Oncology | Admitting: Oncology

## 2021-02-21 ENCOUNTER — Encounter: Payer: Self-pay | Admitting: Oncology

## 2021-02-21 VITALS — BP 134/73 | HR 76

## 2021-02-21 VITALS — BP 112/73 | HR 84 | Temp 98.1°F | Resp 18 | Wt 204.5 lb

## 2021-02-21 DIAGNOSIS — C3411 Malignant neoplasm of upper lobe, right bronchus or lung: Secondary | ICD-10-CM | POA: Insufficient documentation

## 2021-02-21 DIAGNOSIS — C3491 Malignant neoplasm of unspecified part of right bronchus or lung: Secondary | ICD-10-CM

## 2021-02-21 DIAGNOSIS — Z5112 Encounter for antineoplastic immunotherapy: Secondary | ICD-10-CM | POA: Insufficient documentation

## 2021-02-21 DIAGNOSIS — I10 Essential (primary) hypertension: Secondary | ICD-10-CM | POA: Insufficient documentation

## 2021-02-21 DIAGNOSIS — E032 Hypothyroidism due to medicaments and other exogenous substances: Secondary | ICD-10-CM | POA: Diagnosis not present

## 2021-02-21 DIAGNOSIS — I9589 Other hypotension: Secondary | ICD-10-CM

## 2021-02-21 DIAGNOSIS — E039 Hypothyroidism, unspecified: Secondary | ICD-10-CM | POA: Diagnosis not present

## 2021-02-21 DIAGNOSIS — E861 Hypovolemia: Secondary | ICD-10-CM | POA: Diagnosis not present

## 2021-02-21 LAB — COMPREHENSIVE METABOLIC PANEL
ALT: 42 U/L (ref 0–44)
AST: 78 U/L — ABNORMAL HIGH (ref 15–41)
Albumin: 3.9 g/dL (ref 3.5–5.0)
Alkaline Phosphatase: 67 U/L (ref 38–126)
Anion gap: 7 (ref 5–15)
BUN: 17 mg/dL (ref 8–23)
CO2: 28 mmol/L (ref 22–32)
Calcium: 9.4 mg/dL (ref 8.9–10.3)
Chloride: 104 mmol/L (ref 98–111)
Creatinine, Ser: 1.35 mg/dL — ABNORMAL HIGH (ref 0.61–1.24)
GFR, Estimated: 59 mL/min — ABNORMAL LOW (ref >60)
Glucose, Bld: 132 mg/dL — ABNORMAL HIGH (ref 70–99)
Potassium: 3.8 mmol/L (ref 3.5–5.1)
Sodium: 139 mmol/L (ref 135–145)
Total Bilirubin: 0.5 mg/dL (ref 0.3–1.2)
Total Protein: 7.1 g/dL (ref 6.5–8.1)

## 2021-02-21 LAB — CBC WITH DIFFERENTIAL/PLATELET
Abs Immature Granulocytes: 0.01 10*3/uL (ref 0.00–0.07)
Basophils Absolute: 0.1 10*3/uL (ref 0.0–0.1)
Basophils Relative: 1 %
Eosinophils Absolute: 0.3 10*3/uL (ref 0.0–0.5)
Eosinophils Relative: 4 %
HCT: 38.9 % — ABNORMAL LOW (ref 39.0–52.0)
Hemoglobin: 12.8 g/dL — ABNORMAL LOW (ref 13.0–17.0)
Immature Granulocytes: 0 %
Lymphocytes Relative: 15 %
Lymphs Abs: 0.8 10*3/uL (ref 0.7–4.0)
MCH: 28.7 pg (ref 26.0–34.0)
MCHC: 32.9 g/dL (ref 30.0–36.0)
MCV: 87.2 fL (ref 80.0–100.0)
Monocytes Absolute: 0.4 10*3/uL (ref 0.1–1.0)
Monocytes Relative: 7 %
Neutro Abs: 4.1 10*3/uL (ref 1.7–7.7)
Neutrophils Relative %: 73 %
Platelets: 186 10*3/uL (ref 150–400)
RBC: 4.46 MIL/uL (ref 4.22–5.81)
RDW: 15 % (ref 11.5–15.5)
WBC: 5.6 10*3/uL (ref 4.0–10.5)
nRBC: 0 % (ref 0.0–0.2)

## 2021-02-21 LAB — TSH: TSH: 261 u[IU]/mL — ABNORMAL HIGH (ref 0.350–4.500)

## 2021-02-21 LAB — T4, FREE: Free T4: <0.25 ng/dL — ABNORMAL LOW (ref 0.61–1.12)

## 2021-02-21 MED ORDER — SODIUM CHLORIDE 0.9 % IV SOLN
10.0000 mg/kg | Freq: Once | INTRAVENOUS | Status: AC
  Administered 2021-02-21: 860 mg via INTRAVENOUS
  Filled 2021-02-21: qty 10

## 2021-02-21 MED ORDER — SODIUM CHLORIDE 0.9 % IV SOLN
Freq: Once | INTRAVENOUS | Status: AC
  Filled 2021-02-21: qty 250

## 2021-02-21 MED ORDER — LEVOTHYROXINE SODIUM 25 MCG PO TABS: 25.0000 ug | ORAL_TABLET | Freq: Every day | ORAL | 0 refills | Status: DC

## 2021-02-21 MED ORDER — SODIUM CHLORIDE 0.9 % IV SOLN
Freq: Once | INTRAVENOUS | Status: DC | PRN
  Filled 2021-02-21: qty 250

## 2021-02-21 MED ORDER — HEPARIN SOD (PORK) LOCK FLUSH 100 UNIT/ML IV SOLN
500.0000 [IU] | Freq: Once | INTRAVENOUS | Status: AC
  Administered 2021-02-21: 500 [IU] via INTRAVENOUS
  Filled 2021-02-21: qty 5

## 2021-02-21 NOTE — Patient Instructions (Signed)
[facility]  Discharge Instructions: Thank you for choosing [facility] to provide your oncology and hematology care.  If you have a lab appointment with the Cancer Center, please go directly to the Cancer Center and check in at the registration area.  Wear comfortable clothing and clothing appropriate for easy access to any Portacath or PICC line.   We strive to give you quality time with your provider. You may need to reschedule your appointment if you arrive late (15 or more minutes).  Arriving late affects you and other patients whose appointments are after yours.  Also, if you miss three or more appointments without notifying the office, you may be dismissed from the clinic at the provider's discretion.      For prescription refill requests, have your pharmacy contact our office and allow 72 hours for refills to be completed.    Today you received the following chemotherapy and/or immunotherapy agents       To help prevent nausea and vomiting after your treatment, we encourage you to take your nausea medication as directed.  BELOW ARE SYMPTOMS THAT SHOULD BE REPORTED IMMEDIATELY: *FEVER GREATER THAN 100.4 F (38 C) OR HIGHER *CHILLS OR SWEATING *NAUSEA AND VOMITING THAT IS NOT CONTROLLED WITH YOUR NAUSEA MEDICATION *UNUSUAL SHORTNESS OF BREATH *UNUSUAL BRUISING OR BLEEDING *URINARY PROBLEMS (pain or burning when urinating, or frequent urination) *BOWEL PROBLEMS (unusual diarrhea, constipation, pain near the anus) TENDERNESS IN MOUTH AND THROAT WITH OR WITHOUT PRESENCE OF ULCERS (sore throat, sores in mouth, or a toothache) UNUSUAL RASH, SWELLING OR PAIN  UNUSUAL VAGINAL DISCHARGE OR ITCHING   Items with * indicate a potential emergency and should be followed up as soon as possible or go to the Emergency Department if any problems should occur.  Please show the CHEMOTHERAPY ALERT CARD or IMMUNOTHERAPY ALERT CARD at check-in to the Emergency  Department and triage nurse.  Should you have questions after your visit or need to cancel or reschedule your appointment, please contact [facility]  [phone] and follow the prompts.  Office hours are 8:00 a.m. to 4:30 p.m. Monday - Friday. Please note that voicemails left after 4:00 p.m. may not be returned until the following business day.  We are closed weekends and major holidays. You have access to a nurse at all times for urgent questions. Please call the main number to the clinic [phone] and follow the prompts.  For any non-urgent questions, you may also contact your provider using MyChart. We now offer e-Visits for anyone 18 and older to request care online for non-urgent symptoms. For details visit [email or web address].   Also download the MyChart app! Go to the app store, search "MyChart", open the app, select [place], and log in with your MyChart username and password.  Due to Covid, a mask is required upon entering the hospital/clinic. If you do not have a mask, one will be given to you upon arrival. For doctor visits, patients may have 1 support person aged 18 or older with them. For treatment visits, patients cannot have anyone with them due to current Covid guidelines and our immunocompromised population.  

## 2021-02-21 NOTE — Progress Notes (Signed)
Hematology/Oncology  note [facility] [phone] [phone]   Patient Care Team: [name], MD as PCP - General (Physician Assistant) [name], MD as Referring Physician (Physician Assistant) Bary Castilla, [name], MD (General Surgery) Telford Nab, RN as Oncology Nurse Navigator  REFERRING PROVIDER: [name], MD  CHIEF COMPLAINTS/REASON FOR VISIT:  Follow up for lung cancer  HISTORY OF PRESENTING ILLNESS:   Joshua [name] is a  65 y.o.  male with PMH listed below was seen in consultation at the request of  [name], MD  for evaluation of lung mass  Patient presented to emergency room on 08/25/2020 for evaluation of hemoptysis, coughing.  He coughed up small amount of blood and a large clot.  No fever, chills, chest pain, abdominal pain.  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  Patient is on Plavix and Eliquis.  Since his ER visit, he has not coughed up more blood.  08/25/2020 CT angio chest PE with and without contrast showed ill-defined soft tissue adjacent to the right upper lobe bronchus resulting in narrowing of right upper lobe bronchus.  Also right paratracheal mediastinal adenopathy.  Scattered centrilobular groundglass opacities within the right lower lobe and to a lesser extent within the right lower lobe likely reflecting an infectious or inflammatory process. # 09/14/2020, right upper lobe lung biopsy and bronchoscopy brushing are both positive for squamous cell carcinoma.Right precarinal space lymph node insufficient lymph node sampling for staging. 09/18/2020, PET scan showed right upper lobe nodule maximal SUV 11.5.  Hypermetabolic cluster right paratracheal adenopathy with maximum SUV up to 12.  No findings of metastatic disease to the neck/abdomen/pelvis or  skeleton.  # 10/03/2000, MRI brain with and without contrast showed no evidence of intracranial metastatic disease. 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  # - NGS showed PD-L1 TPS 95%, LRP1B S939fs, PIK3CA E545K, RB1 c1422-2A>T, RIT1 T83_ A84del, STK11 P242fs, TPS53 R248W, TMB 19.62mut/mb, MS stable.   #10/10/2020 started concurrent chemoradiation.  Patient received 4 cycles of carboplatin AUC of 2 and Taxol 45 mg per metered square  Additional chemotherapy was held due to thrombocytopenia, neutropenia Patient finished radiation on 11/27/2020 INTERVAL HISTORY Joshua [name] is a 65 y.o. male who has above history reviewed by me today presents for follow up visit for Stage III lung squamous cell carcinoma. Patient reports feeling well.  Patient denies nausea vomiting diarrhea fever or chills he needs to have dental procedure done and asks if okay.   Review of Systems  Constitutional:  Negative for appetite change, chills, fatigue, fever and unexpected weight change.  HENT:   Negative for hearing loss and voice change.   Eyes:  Negative for eye problems and icterus.  Respiratory:  Negative for chest tightness, cough, hemoptysis and shortness of breath.   Cardiovascular:  Negative for chest pain and leg swelling.  Gastrointestinal:  Negative for abdominal distention and abdominal pain.  Endocrine: Negative for hot flashes.  Genitourinary:  Negative for difficulty urinating, dysuria and frequency.   Musculoskeletal:  Negative for arthralgias.  Skin:  Negative for itching and rash.  Neurological:  Negative for light-headedness and numbness.  Hematological:  Negative for adenopathy. Does not bruise/bleed easily.  Psychiatric/Behavioral:  Negative for confusion.    MEDICAL HISTORY:  Past Medical History:  Diagnosis  Date   Allergy    Cancer (HCC)    lung   Dyspnea    former smoker. only  doe   History of kidney stones    Hypertension    Peripheral vascular disease (HCC)    Pre-diabetes    Sleep apnea     SURGICAL HISTORY: Past Surgical History:  Procedure Laterality Date   ANTERIOR CERVICAL DECOMP/DISCECTOMY FUSION N/A 03/14/2020   Procedure: ANTERIOR CERVICAL DECOMPRESSION/DISCECTOMY FUSION 3 LEVELS C4-7;  Surgeon: Venetia Night, MD;  Location: [facility];  Service: Neurosurgery;  Laterality: N/A;   BACK SURGERY  2011   CENTRAL LINE INSERTION Right 09/10/2016   Procedure: CENTRAL LINE INSERTION;  Surgeon: Renford Dills, MD;  Location: [facility];  Service: Vascular;  Laterality: Right;   CERVICAL FUSION     C4-c7 on Mar 14, 2020   COLONOSCOPY  2013 ?   CORONARY ANGIOPLASTY     FEMORAL-POPLITEAL BYPASS GRAFT Right 09/10/2016   Procedure: BYPASS GRAFT FEMORAL-POPLITEAL ARTERY ( BELOW KNEE );  Surgeon: Renford Dills, MD;  Location: [facility];  Service: Vascular;  Laterality: Right;   FEMORAL-TIBIAL BYPASS GRAFT Right 03/23/2020   Procedure: BYPASS GRAFT RIGHT FEMORAL- DISTAL TIBIAL ARTERY WITH DISTAL FLOW GRAFT;  Surgeon: Renford Dills, MD;  Location: [facility];  Service: Vascular;  Laterality: Right;   LOWER EXTREMITY ANGIOGRAPHY Right 11/18/2016   Procedure: Lower Extremity Angiography;  Surgeon: Renford Dills, MD;  Location: [facility];  Service: Cardiovascular;  Laterality: Right;   LOWER EXTREMITY ANGIOGRAPHY Right 12/09/2016   Procedure: Lower Extremity Angiography;  Surgeon: Renford Dills, MD;  Location: [facility];  Service: Cardiovascular;  Laterality: Right;   LOWER EXTREMITY ANGIOGRAPHY Right 11/15/2019   Procedure: LOWER EXTREMITY ANGIOGRAPHY;  Surgeon: Renford Dills, MD;  Location: [facility];  Service: Cardiovascular;  Laterality: Right;   LOWER EXTREMITY ANGIOGRAPHY Right 12/01/2019   Procedure: Lower Extremity Angiography;  Surgeon: Annice Needy, MD;  Location: [facility];  Service: Cardiovascular;   Laterality: Right;   LOWER EXTREMITY ANGIOGRAPHY Right 12/02/2019   Procedure: LOWER EXTREMITY ANGIOGRAPHY;  Surgeon: Renford Dills, MD;  Location: [facility];  Service: Cardiovascular;  Laterality: Right;   LOWER EXTREMITY ANGIOGRAPHY Right 03/23/2020   Procedure: Lower Extremity Angiography;  Surgeon: Renford Dills, MD;  Location: [facility];  Service: Cardiovascular;  Laterality: Right;   LOWER EXTREMITY INTERVENTION  11/18/2016   Procedure: Lower Extremity Intervention;  Surgeon: Renford Dills, MD;  Location: [facility];  Service: Cardiovascular;;   PERIPHERAL VASCULAR CATHETERIZATION Right 01/02/2015   Procedure: Lower Extremity Angiography;  Surgeon: Renford Dills, MD;  Location: [facility];  Service: Cardiovascular;  Laterality: Right;   PERIPHERAL VASCULAR CATHETERIZATION Right 06/18/2015   Procedure: Lower Extremity Angiography;  Surgeon: Annice Needy, MD;  Location: [facility];  Service: Cardiovascular;  Laterality: Right;   PERIPHERAL VASCULAR CATHETERIZATION  06/18/2015   Procedure: Lower Extremity Intervention;  Surgeon: Annice Needy, MD;  Location: [facility];  Service: Cardiovascular;;   PERIPHERAL VASCULAR CATHETERIZATION N/A 10/09/2015   Procedure: Abdominal Aortogram w/Lower Extremity;  Surgeon: Renford Dills, MD;  Location: [facility];  Service: Cardiovascular;  Laterality: N/A;   PERIPHERAL VASCULAR CATHETERIZATION  10/09/2015   Procedure: Lower Extremity Intervention;  Surgeon: Renford Dills, MD;  Location: [facility];  Service: Cardiovascular;;   PERIPHERAL VASCULAR CATHETERIZATION Right 10/10/2015  Procedure: Lower Extremity Angiography;  Surgeon: Algernon Huxley, MD;  Location: [place] [facility];  Service: Cardiovascular;  Laterality: Right;   PERIPHERAL VASCULAR CATHETERIZATION Right 10/10/2015   Procedure: Lower Extremity Intervention;  Surgeon: Algernon Huxley, MD;  Location: [place] [facility];  Service: Cardiovascular;  Laterality: Right;   PERIPHERAL VASCULAR CATHETERIZATION Right 12/03/2015   Procedure: Lower Extremity Angiography;  Surgeon: Algernon Huxley, MD;  Location: [place] [facility];  Service: Cardiovascular;  Laterality: Right;   PERIPHERAL VASCULAR CATHETERIZATION Right 12/04/2015   Procedure: Lower Extremity Angiography;  Surgeon: Algernon Huxley, MD;  Location: [place] [facility];  Service: Cardiovascular;  Laterality: Right;   PERIPHERAL VASCULAR CATHETERIZATION  12/04/2015   Procedure: Lower Extremity Intervention;  Surgeon: Algernon Huxley, MD;  Location: [place] [facility];  Service: Cardiovascular;;   PERIPHERAL VASCULAR CATHETERIZATION Right 06/30/2016   Procedure: Lower Extremity Angiography;  Surgeon: Algernon Huxley, MD;  Location: [place] [facility];  Service: Cardiovascular;  Laterality: Right;   PERIPHERAL VASCULAR CATHETERIZATION Right 06/25/2016   Procedure: Lower Extremity Angiography;  Surgeon: Katha Cabal, MD;  Location: [place] [facility];  Service: Cardiovascular;  Laterality: Right;   PERIPHERAL VASCULAR CATHETERIZATION Right 07/01/2016   Procedure: Lower Extremity Angiography;  Surgeon: Katha Cabal, MD;  Location: [place] [facility];  Service: Cardiovascular;  Laterality: Right;   PERIPHERAL VASCULAR CATHETERIZATION  07/01/2016   Procedure: Lower Extremity Intervention;  Surgeon: Katha Cabal, MD;  Location: [place] [facility];  Service: Cardiovascular;;   PERIPHERAL VASCULAR CATHETERIZATION Right 08/01/2016   Procedure: Lower Extremity Angiography;  Surgeon: Katha Cabal, MD;  Location: [place] [facility];  Service: Cardiovascular;  Laterality: Right;   PORTA CATH INSERTION N/A 10/05/2020   Procedure: PORTA CATH INSERTION;  Surgeon: Katha Cabal, MD;  Location: [place] [facility];  Service: Cardiovascular;  Laterality: N/A;   stent placement in right leg Right    VIDEO BRONCHOSCOPY N/A 09/14/2020   Procedure:  VIDEO BRONCHOSCOPY WITH FLUORO;  Surgeon: Tyler Pita, MD;  Location: [facility];  Service: Cardiopulmonary;  Laterality: N/A;   VIDEO BRONCHOSCOPY WITH ENDOBRONCHIAL ULTRASOUND N/A 09/14/2020   Procedure: VIDEO BRONCHOSCOPY WITH ENDOBRONCHIAL ULTRASOUND;  Surgeon: Tyler Pita, MD;  Location: [facility];  Service: Cardiopulmonary;  Laterality: N/A;    SOCIAL HISTORY: Social History   Socioeconomic History   Marital status: Single    Spouse name: Not on file   Number of children: Not on file   Years of education: Not on file   Highest education level: Not on file  Occupational History   Occupation: unemployed  Tobacco Use   Smoking status: Former    Packs/day: 2.00    Years: 38.00    Pack years: 76.00    Types: Cigarettes    Quit date: 12/20/2014    Years since quitting: 6.1   Smokeless tobacco: Former   Tobacco comments:    quit smoking in 2017 most smoked 2   Vaping Use   Vaping Use: Never used  Substance and Sexual Activity   Alcohol use: Not Currently   Drug use: No   Sexual activity: Yes  Other Topics Concern   Not on file  Social History Narrative   Not on file   Social Determinants of Health   Financial Resource Strain: Not on file  Food Insecurity: Not on file  Transportation Needs: Not on file  Physical Activity: Not on file  Stress: Not on  file  Social Connections: Not on file  Intimate Partner Violence: Not on file    FAMILY HISTORY: Family History  Problem Relation Age of Onset   Diabetes Mother    Hypertension Mother    Heart murmur Mother    Leukemia Mother    Throat cancer Maternal Grandmother    Colon cancer Neg Hx    Breast cancer Neg Hx     ALLERGIES:  is allergic to tape.  MEDICATIONS:  Current Outpatient Medications  Medication Sig Dispense Refill   acetaminophen (TYLENOL) 325 MG tablet Take 650 mg by mouth every 6 (six) hours as needed for moderate pain.     clopidogrel (PLAVIX) 75 MG tablet Take 1 tablet (75 mg total) by  mouth daily at 6 (six) AM. 60 tablet 1   ELIQUIS 5 MG TABS tablet TAKE 1 TABLET BY MOUTH  TWICE DAILY 180 tablet 3   gabapentin (NEURONTIN) 300 MG capsule Take 2 capsules (600 mg total) by mouth 3 (three) times daily. (Patient taking differently: Take 900 mg by mouth at bedtime as needed (pain).) 540 capsule 1   lidocaine-prilocaine (EMLA) cream Apply to affected area once 30 g 3   Multiple Vitamin (MULTIVITAMIN) tablet Take 1 tablet by mouth daily.     simvastatin (ZOCOR) 40 MG tablet TAKE 1 TABLET BY MOUTH AT  BEDTIME 90 tablet 0   lisinopril (ZESTRIL) 10 MG tablet TAKE 1 TABLET BY MOUTH  DAILY (Patient not taking: Reported on 02/21/2021) 90 tablet 0   No current facility-administered medications for this visit.   Facility-Administered Medications Ordered in Other Visits  Medication Dose Route Frequency Provider Last Rate Last Admin   0.9 %  sodium chloride infusion   Intravenous Once PRN [name], MD   Stopped at 02/21/21 1047     PHYSICAL EXAMINATION: ECOG PERFORMANCE STATUS: 1 - Symptomatic but completely ambulatory Vitals:   02/21/21 0928  BP: 112/73  Pulse: 84  Resp: 18  Temp: 98.1 F (36.7 C)   Filed Weights   02/21/21 0928  Weight: 204 lb 7.6 oz (92.8 kg)    Physical Exam Constitutional:      General: He is not in acute distress.    Comments: Walks with a cane.   HENT:     Head: Normocephalic and atraumatic.  Eyes:     General: No scleral icterus. Cardiovascular:     Rate and Rhythm: Normal rate and regular rhythm.     Heart sounds: Normal heart sounds.  Pulmonary:     Effort: Pulmonary effort is normal. No respiratory distress.     Breath sounds: No wheezing.  Abdominal:     General: Bowel sounds are normal. There is no distension.     Palpations: Abdomen is soft.  Musculoskeletal:        General: No deformity. Normal range of motion.     Cervical back: Normal range of motion and neck supple.  Skin:    General: Skin is warm and dry.     Findings: No  erythema or rash.  Neurological:     Mental Status: He is alert and oriented to person, place, and time. Mental status is at baseline.     Cranial Nerves: No cranial nerve deficit.     Coordination: Coordination normal.  Psychiatric:        Mood and Affect: Mood normal.    LABORATORY DATA:  I have reviewed the data as listed Lab Results  Component Value Date   WBC 5.6 02/21/2021  HGB 12.8 (L) 02/21/2021   HCT 38.9 (L) 02/21/2021   MCV 87.2 02/21/2021   PLT 186 02/21/2021   Recent Labs    03/28/20 0421 03/29/20 0444 03/30/20 0536 04/16/20 1525 08/25/20 1512 01/24/21 1300 02/07/21 0836 02/21/21 0848  NA 133* 139 140  --    < > 139 139 139  K 4.1 4.0 4.1  --    < > 3.6 3.8 3.8  CL 101 103 102  --    < > 104 105 104  CO2 $Re'28 28 28  'qZo$ --    < > $R'27 26 28  'Sx$ GLUCOSE 116* 101* 99  --    < > 120* 131* 132*  BUN $Re'11 12 12  'qJM$ --    < > $R'11 18 17  'PG$ CREATININE 0.76 0.85 0.76  --    < > 0.95 1.23 1.35*  CALCIUM 8.2* 8.4* 8.6*  --    < > 9.2 9.2 9.4  GFRNONAA >60 >60 >60  --    < > >60 >60 59*  GFRAA >60 >60 >60  --   --   --   --   --   PROT  --   --   --  7.0   < > 7.3 6.8 7.1  ALBUMIN  --   --   --  4.3   < > 3.9 3.9 3.9  AST  --   --   --  17   < > 26 40 78*  ALT  --   --   --  19   < > 35 24 42  ALKPHOS  --   --   --  123*   < > 90 75 67  BILITOT  --   --   --  0.4   < > 0.5 0.6 0.5  BILIDIR  --   --   --  0.10  --   --   --   --    < > = values in this interval not displayed.    Iron/TIBC/Ferritin/ %Sat    Component Value Date/Time   IRON 48 08/30/2020 1052   TIBC 391 08/30/2020 1052   FERRITIN 25 08/30/2020 1052   IRONPCTSAT 12 (L) 08/30/2020 1052      RADIOGRAPHIC STUDIES: I have personally reviewed the radiological images as listed and agreed with the findings in the report. DG Chest 2 View  Result Date: 01/14/2021 CLINICAL DATA:  Tachycardia.  History of lung cancer. EXAM: CHEST - 2 VIEW COMPARISON:  09/14/2020 and CT of the chest on 12/25/2020 FINDINGS: Patient has  RIGHT-sided PowerPort, tip to the superior vena cava. There is no pneumothorax. Cardiac mediastinal silhouette is normal. There is minimal focal patchy opacity in the RIGHT UPPER lobe, consistent with atelectasis or early infiltrate. No frank consolidations. No evidence for pulmonary edema. No pleural effusions. Remote cervical spine fusion. IMPRESSION: 1. RIGHT-sided power port. 2. RIGHT UPPER lobe infiltrate or atelectasis. Electronically Signed   By: Nolon Nations M.D.   On: 01/14/2021 11:16   CT Chest W Contrast  Result Date: 12/26/2020 CLINICAL DATA:  Restaging lung cancer. EXAM: CT CHEST WITH CONTRAST TECHNIQUE: Multidetector CT imaging of the chest was performed during intravenous contrast administration. CONTRAST:  8mL OMNIPAQUE IOHEXOL 300 MG/ML  SOLN COMPARISON:  PET-CT 09/18/2020 FINDINGS: Cardiovascular: The heart size appears within normal limits. No pericardial effusion. Coronary artery calcifications noted. Mediastinum/Nodes: Normal appearance of the thyroid gland. The trachea appears patent and is midline. Normal appearance of the esophagus.  High right paratracheal lymph node measures 1 cm, image 38/2. Previously 1.6 cm. Low right paratracheal lymph node measures 1 cm, image 66/2. Unchanged from previous exam. Lungs/Pleura: No pleural effusion. No airspace consolidation, atelectasis, or pneumothorax. Paraseptal emphysema. Right hilar mass/adenopathy measures 2.1 x 1.3 cm, image 72/2. Previously 2.3 x 2.5 cm. No new pulmonary nodule or mass. Upper Abdomen: No acute abnormality. Musculoskeletal: No chest wall abnormality. No acute or significant osseous findings. IMPRESSION: 1. Interval response to therapy. The right hilar mass/adenopathy has decreased in size in the interval. 2. Stable to decreased size of right paratracheal lymph node. 3. Coronary artery calcifications. 4. Emphysema and aortic atherosclerosis. Aortic Atherosclerosis (ICD10-I70.0) and Emphysema (ICD10-J43.9). Electronically  Signed   By: Kerby Moors M.D.   On: 12/26/2020 09:21   ABI WITH/WO TBI  Result Date: 01/28/2021  LOWER EXTREMITY DOPPLER STUDY Patient Name:  JARAY BOLIVER  Date of Exam:   01/17/2021 Medical Rec #: [id number]            Accession #:    [id number] Date of Birth: 13-May-1956           Patient Gender: M Patient Age:   64Y Exam Location:  [facility] Procedure:      VAS [place] ABI WITH/WO TBI Referring Phys: [id number] Maguayo --------------------------------------------------------------------------------  Indications: Routine f/u. Other Factors: ASO s/p multiple interventions and bypass.                11/15/2019 Right thrombectomy of Fempop insitu graft with added                stent throughout.                 12/01/2019: Aortogram and Selctive Right Lower Extremity                Angiogram. 6 mg of TPA delivered in a lysis catheterfrom the                Origin of the Right Femoral -Popliteal bypass down the proximal                ATA with placement of the lysis.                 12/02/2019: PTA of the Right Posterior Tibial Artery. PTA of the                Right Peroneal Artery. Mechanical Thrombectomy of the Right                Femoral -Popliteal Bypass graft. Mechanical Thrombectomy of the                Right Tibioperoneal Trunk and Posterior Tibial Artery.  Vascular Interventions: Hx of multiple right lower extremity PTA's/stents.                         09/10/2016 Right femoral to below the knee popliteal                         artery bypass graft placement. 5/01 Right bypass graft &                         mid popliteal artery PTA & coil embolectomy of large  branch of bypass graft. 12/09/16 Right distal bypass                         graft PTA/stent and right posterior tibial artery PTA.                         03/23/2020 Rt PTA thrombectomy. Rt Profunda fem artery to                         PTA bypass. Comparison Study: 10/08/2020 Performing Technologist: Charlane Ferretti RT (R)(VS)  Examination Guidelines: A complete evaluation includes at minimum, Doppler waveform signals and systolic blood pressure reading at the level of bilateral brachial, anterior tibial, and posterior tibial arteries, when vessel segments are accessible. Bilateral testing is considered an integral part of a complete examination. Photoelectric Plethysmograph (PPG) waveforms and toe systolic pressure readings are included as required and additional duplex testing as needed. Limited examinations for reoccurring indications may be performed as noted.  ABI Findings: +---------+------------------+-----+---------+--------+ Right    Rt Pressure (mmHg)IndexWaveform Comment  +---------+------------------+-----+---------+--------+ Brachial 141                                      +---------+------------------+-----+---------+--------+ ATA      145               1.03 triphasic         +---------+------------------+-----+---------+--------+ PTA      153               1.09 triphasic         +---------+------------------+-----+---------+--------+ Great Toe100               0.71 Dampened          +---------+------------------+-----+---------+--------+ +---------+------------------+-----+---------+-------+ Left     Lt Pressure (mmHg)IndexWaveform Comment +---------+------------------+-----+---------+-------+ Brachial 135                                     +---------+------------------+-----+---------+-------+ ATA      155               1.10 triphasic        +---------+------------------+-----+---------+-------+ PTA      143               1.01 triphasic        +---------+------------------+-----+---------+-------+ Great Toe98                0.70 Normal           +---------+------------------+-----+---------+-------+ +-------+-----------+-----------+------------+------------+ ABI/TBIToday's ABIToday's TBIPrevious ABIPrevious TBI  +-------+-----------+-----------+------------+------------+ Right  1.09       .71        1.05        .73          +-------+-----------+-----------+------------+------------+ Left   1.10       .70        1.19        1.06         +-------+-----------+-----------+------------+------------+ Bilateral ABIs appear essentially unchanged compared to prior study on 10/08/2020. Bilateral TBIs appear essentially unchanged compared to prior study on 10/08/2020.  Summary: Right: Resting right ankle-brachial index is within normal range. No evidence of significant right lower extremity arterial disease. The right toe-brachial index is normal. Left:  Resting left ankle-brachial index is within normal range. No evidence of significant left lower extremity arterial disease. The left toe-brachial index is normal. *See table(s) above for measurements and observations.  Electronically signed by [name] MD on 01/28/2021 at 1:12:33 PM.    Final        ASSESSMENT & PLAN:  1. Squamous cell carcinoma of right lung ([place])   2. Encounter for antineoplastic immunotherapy   3. Hypotension due to hypovolemia   Cancer Staging Squamous cell carcinoma of right lung [facility]) Staging form: Lung, AJCC 8th Edition - Clinical stage from 09/20/2020: Stage IIIA (cT1b, cN2, cM0) - Signed by [name], MD on 09/20/2020   #Squamous cell carcinoma of right lung, stage IIIA, Labs are reviewed and discussed with patient Procedure.  Maintenance durvalumab today.   #Hypertension, blood pressure has improved, within normal limits.  Currently he is doing well while being off his BP meds.  Recommend patient continue monitor and if systolic blood pressure progressively increase, he may resume lisinopril. 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  Management per ENT  #Hypothyroidism, TSH has significantly increased, T4 is reduced.  This is consistent with immunotherapy induced hypothyroidism.  Will recommend patient to start  low-dose Synthroid and follow-up on thyroid medication.  All questions were answered. The patient knows to call the clinic with any problems questions or concerns.  cc [name], MD   Return of visit: 2 weeks  [name], MD, PhD Hematology Oncology [facility] at [facility] Pager- [phone] 02/21/2021

## 2021-02-22 ENCOUNTER — Telehealth: Payer: Self-pay

## 2021-02-22 NOTE — Telephone Encounter (Signed)
Mychart message sent to pt:  Mr.Kiener,  Your lab work shows low thyroid function level. This is likely due to the cancer treatment.  I recommend you to start thyroid medication. Levothyroxine78mcg once daily. Rx was sent to your pharmacy.    Dr.Yu

## 2021-02-28 ENCOUNTER — Other Ambulatory Visit (INDEPENDENT_AMBULATORY_CARE_PROVIDER_SITE_OTHER): Payer: Self-pay | Admitting: Nurse Practitioner

## 2021-03-07 ENCOUNTER — Inpatient Hospital Stay: Payer: Medicare Other

## 2021-03-07 ENCOUNTER — Inpatient Hospital Stay (HOSPITAL_BASED_OUTPATIENT_CLINIC_OR_DEPARTMENT_OTHER): Payer: Medicare Other | Admitting: Oncology

## 2021-03-07 ENCOUNTER — Other Ambulatory Visit: Payer: Self-pay

## 2021-03-07 ENCOUNTER — Encounter: Payer: Self-pay | Admitting: Oncology

## 2021-03-07 VITALS — BP 144/86 | HR 77 | Temp 97.3°F | Resp 18 | Wt 206.0 lb

## 2021-03-07 VITALS — BP 146/82 | HR 80 | Temp 97.5°F

## 2021-03-07 DIAGNOSIS — C3491 Malignant neoplasm of unspecified part of right bronchus or lung: Secondary | ICD-10-CM | POA: Diagnosis not present

## 2021-03-07 DIAGNOSIS — Z5112 Encounter for antineoplastic immunotherapy: Secondary | ICD-10-CM

## 2021-03-07 DIAGNOSIS — E032 Hypothyroidism due to medicaments and other exogenous substances: Secondary | ICD-10-CM

## 2021-03-07 LAB — COMPREHENSIVE METABOLIC PANEL
ALT: 19 U/L (ref 0–44)
AST: 54 U/L — ABNORMAL HIGH (ref 15–41)
Albumin: 4.2 g/dL (ref 3.5–5.0)
Alkaline Phosphatase: 72 U/L (ref 38–126)
Anion gap: 9 (ref 5–15)
BUN: 18 mg/dL (ref 8–23)
CO2: 25 mmol/L (ref 22–32)
Calcium: 9.1 mg/dL (ref 8.9–10.3)
Chloride: 102 mmol/L (ref 98–111)
Creatinine, Ser: 1.24 mg/dL (ref 0.61–1.24)
GFR, Estimated: >60 mL/min (ref >60)
Glucose, Bld: 151 mg/dL — ABNORMAL HIGH (ref 70–99)
Potassium: 3.5 mmol/L (ref 3.5–5.1)
Sodium: 136 mmol/L (ref 135–145)
Total Bilirubin: 0.5 mg/dL (ref 0.3–1.2)
Total Protein: 7.6 g/dL (ref 6.5–8.1)

## 2021-03-07 LAB — CBC WITH DIFFERENTIAL/PLATELET
Abs Immature Granulocytes: 0.01 10*3/uL (ref 0.00–0.07)
Basophils Absolute: 0.1 10*3/uL (ref 0.0–0.1)
Basophils Relative: 1 %
Eosinophils Absolute: 0.1 10*3/uL (ref 0.0–0.5)
Eosinophils Relative: 2 %
HCT: 37.1 % — ABNORMAL LOW (ref 39.0–52.0)
Hemoglobin: 12.3 g/dL — ABNORMAL LOW (ref 13.0–17.0)
Immature Granulocytes: 0 %
Lymphocytes Relative: 15 %
Lymphs Abs: 0.8 10*3/uL (ref 0.7–4.0)
MCH: 29.1 pg (ref 26.0–34.0)
MCHC: 33.2 g/dL (ref 30.0–36.0)
MCV: 87.7 fL (ref 80.0–100.0)
Monocytes Absolute: 0.6 10*3/uL (ref 0.1–1.0)
Monocytes Relative: 11 %
Neutro Abs: 3.9 10*3/uL (ref 1.7–7.7)
Neutrophils Relative %: 71 %
Platelets: 162 10*3/uL (ref 150–400)
RBC: 4.23 MIL/uL (ref 4.22–5.81)
RDW: 14.6 % (ref 11.5–15.5)
WBC: 5.5 10*3/uL (ref 4.0–10.5)
nRBC: 0 % (ref 0.0–0.2)

## 2021-03-07 LAB — TSH: TSH: 232.679 u[IU]/mL — ABNORMAL HIGH (ref 0.350–4.500)

## 2021-03-07 MED ORDER — HEPARIN SOD (PORK) LOCK FLUSH 100 UNIT/ML IV SOLN
500.0000 [IU] | Freq: Once | INTRAVENOUS | Status: AC
Start: 2021-03-07 — End: 2021-03-07
  Administered 2021-03-07: 500 [IU] via INTRAVENOUS
  Filled 2021-03-07: qty 5

## 2021-03-07 MED ORDER — SODIUM CHLORIDE 0.9 % IV SOLN
Freq: Once | INTRAVENOUS | Status: AC
  Filled 2021-03-07: qty 250

## 2021-03-07 MED ORDER — SODIUM CHLORIDE 0.9% FLUSH
10.0000 mL | INTRAVENOUS | Status: DC | PRN
  Administered 2021-03-07: 10 mL via INTRAVENOUS
  Filled 2021-03-07: qty 10

## 2021-03-07 MED ORDER — SODIUM CHLORIDE 0.9 % IV SOLN
10.0000 mg/kg | Freq: Once | INTRAVENOUS | Status: AC
  Administered 2021-03-07: 860 mg via INTRAVENOUS
  Filled 2021-03-07: qty 10

## 2021-03-07 NOTE — Patient Instructions (Signed)
[facility]  Discharge Instructions: Thank you for choosing [facility] to provide your oncology and hematology care.  If you have a lab appointment with the [facility], please go directly to the [facility] and check in at the registration area.  Wear comfortable clothing and clothing appropriate for easy access to any Portacath or PICC line.   We strive to give you quality time with your provider. You may need to reschedule your appointment if you arrive late (15 or more minutes).  Arriving late affects you and other patients whose appointments are after yours.  Also, if you miss three or more appointments without notifying the office, you may be dismissed from the clinic at the provider's discretion.      For prescription refill requests, have your pharmacy contact our office and allow 72 hours for refills to be completed.    Today you received the following chemotherapy and/or immunotherapy agents DurvalumabDurvalumab injection What is this medication? DURVALUMAB (dur VAL ue mab) is a monoclonal antibody. It is used to treat lungcancer. This medicine may be used for other purposes; ask your health care provider orpharmacist if you have questions. COMMON BRAND NAME(S): IMFINZI What should I tell my care team before I take this medication? They need to know if you have any of these conditions: autoimmune diseases like Crohn's disease, ulcerative colitis, or lupus have had or planning to have an allogeneic stem cell transplant (uses someone else's stem cells) history of organ transplant history of radiation to the chest nervous system problems like myasthenia gravis or Guillain-Barre syndrome an unusual or allergic reaction to durvalumab, other medicines, foods, dyes, or preservatives pregnant or trying to get pregnant breast-feeding How should I use this medication? This medicine is for infusion into a vein. It is given by a health  careprofessional in a hospital or clinic setting. A special MedGuide will be given to you before each treatment. Be sure to readthis information carefully each time. Talk to your pediatrician regarding the use of this medicine in children.Special care may be needed. Overdosage: If you think you have taken too much of this medicine contact apoison control center or emergency room at once. NOTE: This medicine is only for you. Do not share this medicine with others. What if I miss a dose? It is important not to miss your dose. Call your doctor or health careprofessional if you are unable to keep an appointment. What may interact with this medication? Interactions have not been studied. This list may not describe all possible interactions. Give your health care provider a list of all the medicines, herbs, non-prescription drugs, or dietary supplements you use. Also tell them if you smoke, drink alcohol, or use illegaldrugs. Some items may interact with your medicine. What should I watch for while using this medication? This drug may make you feel generally unwell. Continue your course of treatmenteven though you feel ill unless your doctor tells you to stop. You may need blood work done while you are taking this medicine. Do not become pregnant while taking this medicine or for 3 months after stopping it. Women should inform their doctor if they wish to become pregnant or think they might be pregnant. There is a potential for serious side effects to an unborn child. Talk to your health care professional or pharmacist for more information. Do not breast-feed an infant while taking this medicine orfor 3 months after stopping it. What side effects may I notice from receiving this  medication? Side effects that you should report to your doctor or health care professionalas soon as possible: allergic reactions like skin rash, itching or hives, swelling of the face, lips, or tongue black, tarry stools bloody  or watery diarrhea breathing problems change in emotions or moods change in sex drive changes in vision chest pain or chest tightness chills confusion cough facial flushing fever headache signs and symptoms of high blood sugar such as dizziness; dry mouth; dry skin; fruity breath; nausea; stomach pain; increased hunger or thirst; increased urination signs and symptoms of liver injury like dark yellow or brown urine; general ill feeling or flu-like symptoms; light-colored stools; loss of appetite; nausea; right upper belly pain; unusually weak or tired; yellowing of the eyes or skin stomach pain trouble passing urine or change in the amount of urine weight gain or weight loss Side effects that usually do not require medical attention (report these toyour doctor or health care professional if they continue or are bothersome): bone pain constipation loss of appetite muscle pain nausea swelling of the ankles, feet, hands tiredness This list may not describe all possible side effects. Call your doctor for medical advice about side effects. You may report side effects to FDA at1-800-FDA-1088. Where should I keep my medication? This drug is given in a hospital or clinic and will not be stored at home. NOTE: This sheet is a summary. It may not cover all possible information. If you have questions about this medicine, talk to your doctor, pharmacist, orhealth care provider.  2022 Elsevier/Gold Standard (2019-09-15 13:01:29)       To help prevent nausea and vomiting after your treatment, we encourage you to take your nausea medication as directed.  BELOW ARE SYMPTOMS THAT SHOULD BE REPORTED IMMEDIATELY: *FEVER GREATER THAN 100.4 F (38 C) OR HIGHER *CHILLS OR SWEATING *NAUSEA AND VOMITING THAT IS NOT CONTROLLED WITH YOUR NAUSEA MEDICATION *UNUSUAL SHORTNESS OF BREATH *UNUSUAL BRUISING OR BLEEDING *URINARY PROBLEMS (pain or burning when urinating, or frequent urination) *BOWEL PROBLEMS  (unusual diarrhea, constipation, pain near the anus) TENDERNESS IN MOUTH AND THROAT WITH OR WITHOUT PRESENCE OF ULCERS (sore throat, sores in mouth, or a toothache) UNUSUAL RASH, SWELLING OR PAIN  UNUSUAL VAGINAL DISCHARGE OR ITCHING   Items with * indicate a potential emergency and should be followed up as soon as possible or go to the Emergency Department if any problems should occur.  Please show the CHEMOTHERAPY ALERT CARD or IMMUNOTHERAPY ALERT CARD at check-in to the Emergency Department and triage nurse.  Should you have questions after your visit or need to cancel or reschedule your appointment, please contact [facility]  [phone] and follow the prompts.  Office hours are 8:00 a.m. to 4:30 p.m. Monday - Friday. Please note that voicemails left after 4:00 p.m. may not be returned until the following business day.  We are closed weekends and major holidays. You have access to a nurse at all times for urgent questions. Please call the main number to the clinic [phone] and follow the prompts.  For any non-urgent questions, you may also contact your provider using MyChart. We now offer e-Visits for anyone 54 and older to request care online for non-urgent symptoms. For details visit [email or web address].   Also download the MyChart app! Go to the app store, search "MyChart", open the app, select [facility], and log in with your MyChart username and password.  Due to Covid, a mask is required upon entering the hospital/clinic. If you do not  have a mask, one will be given to you upon arrival. For doctor visits, patients may have 1 support person aged 65 or older with them. For treatment visits, patients cannot have anyone with them due to current Covid guidelines and our immunocompromised population.

## 2021-03-07 NOTE — Progress Notes (Signed)
Pt states since he started levothyroxine on 8/4 he has been having constant headaches, not sure if it is related to the med.

## 2021-03-07 NOTE — Progress Notes (Signed)
Hematology/Oncology Progress note  Patient Care Team: [name], MD as PCP - General (Physician Assistant) [name], MD as Referring Physician (Physician Assistant) Bary Castilla, [name], MD (General Surgery) Telford Nab, RN as Oncology Nurse Navigator  REFERRING PROVIDER: [name], MD  CHIEF COMPLAINTS/REASON FOR VISIT:  Follow up for lung cancer  HISTORY OF PRESENTING ILLNESS:   Joshua [name] is a  65 y.o.  male with PMH listed below was seen in consultation at the request of  [name], MD  for evaluation of lung mass  Patient presented to emergency room on 08/25/2020 for evaluation of hemoptysis, coughing.  He coughed up small amount of blood and a large clot.  No fever, chills, chest pain, abdominal pain.  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  Patient is on Plavix and Eliquis.  Since his ER visit, he has not coughed up more blood.  08/25/2020 CT angio chest PE with and without contrast showed ill-defined soft tissue adjacent to the right upper lobe bronchus resulting in narrowing of right upper lobe bronchus.  Also right paratracheal mediastinal adenopathy.  Scattered centrilobular groundglass opacities within the right lower lobe and to a lesser extent within the right lower lobe likely reflecting an infectious or inflammatory process. # 09/14/2020, right upper lobe lung biopsy and bronchoscopy brushing are both positive for squamous cell carcinoma.Right precarinal space lymph node insufficient lymph node sampling for staging. 09/18/2020, PET scan showed right upper lobe nodule maximal SUV 11.5.  Hypermetabolic cluster right paratracheal adenopathy with maximum SUV up to 12.  No findings of metastatic disease to the neck/abdomen/pelvis or skeleton.  # 10/03/2000, MRI brain with and without contrast showed no  evidence of intracranial metastatic disease. 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  # - NGS showed PD-L1 TPS 95%, LRP1B S933f, PIK3CA E545K, RB1 c1422-2A>T, RIT1 T83_ A84del, STK11 P2867f TPS53 R248W, TMB 19.50m87mmb, MS stable.   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  Management per ENT   #10/10/2020 started concurrent chemoradiation.  Patient received 4 cycles of carboplatin AUC of 2 and Taxol 45 mg per metered square  Additional chemotherapy was held due to thrombocytopenia, neutropenia Patient finished radiation on 11/27/2020 INTERVAL HISTORY Joshua [name] a 64 9o. male who has above history reviewed by me today presents for follow up visit for Stage III lung squamous cell carcinoma. Patient reports feeling well.  He had some allergy symptoms.  No sore throat, fever. Denies any nausea vomiting, diarrhea, skin rash  Review of Systems  Constitutional:  Negative for appetite change, chills, fatigue, fever and unexpected weight change.  HENT:   Negative for hearing loss and voice change.   Eyes:  Negative for eye problems and icterus.  Respiratory:  Negative for chest tightness, cough, hemoptysis and shortness of breath.   Cardiovascular:  Negative for chest pain and leg swelling.  Gastrointestinal:  Negative for abdominal distention and abdominal pain.  Endocrine: Negative for hot flashes.  Genitourinary:  Negative for difficulty urinating, dysuria and frequency.   Musculoskeletal:  Negative for arthralgias.  Skin:  Negative for itching and rash.  Neurological:  Negative for light-headedness and numbness.  Hematological:  Negative for adenopathy. Does not bruise/bleed easily.  Psychiatric/Behavioral:  Negative for confusion.    MEDICAL HISTORY:  Past Medical History:  Diagnosis Date   Allergy    Cancer ([place])    lung   Dyspnea    former smoker. only doe    History of kidney stones    Hypertension    Peripheral vascular disease ([place])    Pre-diabetes    Sleep apnea     SURGICAL HISTORY: Past Surgical History:  Procedure Laterality Date   ANTERIOR CERVICAL DECOMP/DISCECTOMY FUSION N/A 03/14/2020   Procedure: ANTERIOR CERVICAL DECOMPRESSION/DISCECTOMY FUSION 3 LEVELS C4-7;  Surgeon: Meade Maw, MD;  Location: [facility];  Service: Neurosurgery;  Laterality: N/A;   BACK SURGERY  2011   CENTRAL LINE INSERTION Right 09/10/2016   Procedure: CENTRAL LINE INSERTION;  Surgeon: Katha Cabal, MD;  Location: [facility];  Service: Vascular;  Laterality: Right;   CERVICAL FUSION     C4-c7 on Mar 14, 2020   COLONOSCOPY  2013 ?   CORONARY ANGIOPLASTY     FEMORAL-POPLITEAL BYPASS GRAFT Right 09/10/2016   Procedure: BYPASS GRAFT FEMORAL-POPLITEAL ARTERY ( BELOW KNEE );  Surgeon: Katha Cabal, MD;  Location: [facility];  Service: Vascular;  Laterality: Right;   FEMORAL-TIBIAL BYPASS GRAFT Right 03/23/2020   Procedure: BYPASS GRAFT RIGHT FEMORAL- DISTAL TIBIAL ARTERY WITH DISTAL FLOW GRAFT;  Surgeon: Katha Cabal, MD;  Location: [facility];  Service: Vascular;  Laterality: Right;   LOWER EXTREMITY ANGIOGRAPHY Right 11/18/2016   Procedure: Lower Extremity Angiography;  Surgeon: Katha Cabal, MD;  Location: [place] [facility];  Service: Cardiovascular;  Laterality: Right;   LOWER EXTREMITY ANGIOGRAPHY Right 12/09/2016   Procedure: Lower Extremity Angiography;  Surgeon: Katha Cabal, MD;  Location: [place] [facility];  Service: Cardiovascular;  Laterality: Right;   LOWER EXTREMITY ANGIOGRAPHY Right 11/15/2019   Procedure: LOWER EXTREMITY ANGIOGRAPHY;  Surgeon: Katha Cabal, MD;  Location: [place] [facility];  Service: Cardiovascular;  Laterality: Right;   LOWER EXTREMITY ANGIOGRAPHY Right 12/01/2019   Procedure: Lower Extremity Angiography;  Surgeon: Algernon Huxley, MD;  Location: [place] [facility];  Service: Cardiovascular;   Laterality: Right;   LOWER EXTREMITY ANGIOGRAPHY Right 12/02/2019   Procedure: LOWER EXTREMITY ANGIOGRAPHY;  Surgeon: Katha Cabal, MD;  Location: [place] [facility];  Service: Cardiovascular;  Laterality: Right;   LOWER EXTREMITY ANGIOGRAPHY Right 03/23/2020   Procedure: Lower Extremity Angiography;  Surgeon: Katha Cabal, MD;  Location: [place] [facility];  Service: Cardiovascular;  Laterality: Right;   LOWER EXTREMITY INTERVENTION  11/18/2016   Procedure: Lower Extremity Intervention;  Surgeon: Katha Cabal, MD;  Location: [place] [facility];  Service: Cardiovascular;;   PERIPHERAL VASCULAR CATHETERIZATION Right 01/02/2015   Procedure: Lower Extremity Angiography;  Surgeon: Katha Cabal, MD;  Location: [place] [facility];  Service: Cardiovascular;  Laterality: Right;   PERIPHERAL VASCULAR CATHETERIZATION Right 06/18/2015   Procedure: Lower Extremity Angiography;  Surgeon: Algernon Huxley, MD;  Location: [place] [facility];  Service: Cardiovascular;  Laterality: Right;   PERIPHERAL VASCULAR CATHETERIZATION  06/18/2015   Procedure: Lower Extremity Intervention;  Surgeon: Algernon Huxley, MD;  Location: [place] [facility];  Service: Cardiovascular;;   PERIPHERAL VASCULAR CATHETERIZATION N/A 10/09/2015   Procedure: Abdominal Aortogram w/Lower Extremity;  Surgeon: Katha Cabal, MD;  Location: [place] [facility];  Service: Cardiovascular;  Laterality: N/A;   PERIPHERAL VASCULAR CATHETERIZATION  10/09/2015   Procedure: Lower Extremity Intervention;  Surgeon: Katha Cabal, MD;  Location: [place] [facility];  Service: Cardiovascular;;   PERIPHERAL VASCULAR CATHETERIZATION Right 10/10/2015  Procedure: Lower Extremity Angiography;  Surgeon: Algernon Huxley, MD;  Location: [place] [facility];  Service: Cardiovascular;  Laterality: Right;   PERIPHERAL VASCULAR CATHETERIZATION Right 10/10/2015   Procedure: Lower Extremity Intervention;  Surgeon: Algernon Huxley, MD;  Location: [place] [facility];  Service: Cardiovascular;  Laterality: Right;   PERIPHERAL VASCULAR CATHETERIZATION Right 12/03/2015   Procedure: Lower Extremity Angiography;  Surgeon: Algernon Huxley, MD;  Location: [place] [facility];  Service: Cardiovascular;  Laterality: Right;   PERIPHERAL VASCULAR CATHETERIZATION Right 12/04/2015   Procedure: Lower Extremity Angiography;  Surgeon: Algernon Huxley, MD;  Location: [place] [facility];  Service: Cardiovascular;  Laterality: Right;   PERIPHERAL VASCULAR CATHETERIZATION  12/04/2015   Procedure: Lower Extremity Intervention;  Surgeon: Algernon Huxley, MD;  Location: [place] [facility];  Service: Cardiovascular;;   PERIPHERAL VASCULAR CATHETERIZATION Right 06/30/2016   Procedure: Lower Extremity Angiography;  Surgeon: Algernon Huxley, MD;  Location: [place] [facility];  Service: Cardiovascular;  Laterality: Right;   PERIPHERAL VASCULAR CATHETERIZATION Right 06/25/2016   Procedure: Lower Extremity Angiography;  Surgeon: Katha Cabal, MD;  Location: [place] [facility];  Service: Cardiovascular;  Laterality: Right;   PERIPHERAL VASCULAR CATHETERIZATION Right 07/01/2016   Procedure: Lower Extremity Angiography;  Surgeon: Katha Cabal, MD;  Location: [place] [facility];  Service: Cardiovascular;  Laterality: Right;   PERIPHERAL VASCULAR CATHETERIZATION  07/01/2016   Procedure: Lower Extremity Intervention;  Surgeon: Katha Cabal, MD;  Location: [place] [facility];  Service: Cardiovascular;;   PERIPHERAL VASCULAR CATHETERIZATION Right 08/01/2016   Procedure: Lower Extremity Angiography;  Surgeon: Katha Cabal, MD;  Location: [place] [facility];  Service: Cardiovascular;  Laterality: Right;   PORTA CATH INSERTION N/A 10/05/2020   Procedure: PORTA CATH INSERTION;  Surgeon: Katha Cabal, MD;  Location: [place] [facility];  Service: Cardiovascular;  Laterality: N/A;   stent placement in right leg Right    VIDEO BRONCHOSCOPY N/A 09/14/2020   Procedure:  VIDEO BRONCHOSCOPY WITH FLUORO;  Surgeon: Tyler Pita, MD;  Location: [facility];  Service: Cardiopulmonary;  Laterality: N/A;   VIDEO BRONCHOSCOPY WITH ENDOBRONCHIAL ULTRASOUND N/A 09/14/2020   Procedure: VIDEO BRONCHOSCOPY WITH ENDOBRONCHIAL ULTRASOUND;  Surgeon: Tyler Pita, MD;  Location: [facility];  Service: Cardiopulmonary;  Laterality: N/A;    SOCIAL HISTORY: Social History   Socioeconomic History   Marital status: Single    Spouse name: Not on file   Number of children: Not on file   Years of education: Not on file   Highest education level: Not on file  Occupational History   Occupation: unemployed  Tobacco Use   Smoking status: Former    Packs/day: 2.00    Years: 38.00    Pack years: 76.00    Types: Cigarettes    Quit date: 12/20/2014    Years since quitting: 6.2   Smokeless tobacco: Former   Tobacco comments:    quit smoking in 2017 most smoked 2   Vaping Use   Vaping Use: Never used  Substance and Sexual Activity   Alcohol use: Not Currently   Drug use: No   Sexual activity: Yes  Other Topics Concern   Not on file  Social History Narrative   Not on file   Social Determinants of Health   Financial Resource Strain: Not on file  Food Insecurity: Not on file  Transportation Needs: Not on file  Physical Activity: Not on file  Stress: Not on  file  Social Connections: Not on file  Intimate Partner Violence: Not on file    FAMILY HISTORY: Family History  Problem Relation Age of Onset   Diabetes Mother    Hypertension Mother    Heart murmur Mother    Leukemia Mother    Throat cancer Maternal Grandmother    Colon cancer Neg Hx    Breast cancer Neg Hx     ALLERGIES:  is allergic to tape.  MEDICATIONS:  Current Outpatient Medications  Medication Sig Dispense Refill   acetaminophen (TYLENOL) 325 MG tablet Take 650 mg by mouth every 6 (six) hours as needed for moderate pain.     clopidogrel (PLAVIX) 75 MG tablet TAKE 1 TABLET BY MOUTH  DAILY 90  tablet 3   ELIQUIS 5 MG TABS tablet TAKE 1 TABLET BY MOUTH  TWICE DAILY 180 tablet 3   gabapentin (NEURONTIN) 300 MG capsule Take 2 capsules (600 mg total) by mouth 3 (three) times daily. (Patient taking differently: Take 900 mg by mouth at bedtime as needed (pain).) 540 capsule 1   levothyroxine (SYNTHROID) 25 MCG tablet Take 1 tablet (25 mcg total) by mouth daily before breakfast. 30 tablet 0   lidocaine-prilocaine (EMLA) cream Apply to affected area once 30 g 3   simvastatin (ZOCOR) 40 MG tablet Take 1 tablet by mouth at bedtime.     lisinopril (ZESTRIL) 10 MG tablet TAKE 1 TABLET BY MOUTH  DAILY (Patient not taking: No sig reported) 90 tablet 0   Multiple Vitamin (MULTIVITAMIN) tablet Take 1 tablet by mouth daily. (Patient not taking: Reported on 03/07/2021)     No current facility-administered medications for this visit.     PHYSICAL EXAMINATION: ECOG PERFORMANCE STATUS: 1 - Symptomatic but completely ambulatory Vitals:   03/07/21 0856  BP: (!) 144/86  Pulse: 77  Resp: 18  Temp: (!) 97.3 F (36.3 C)  SpO2: 100%   Filed Weights   03/07/21 0856  Weight: 206 lb 0.7 oz (93.5 kg)    Physical Exam Constitutional:      General: He is not in acute distress.    Comments: Walks with a cane.   HENT:     Head: Normocephalic and atraumatic.  Eyes:     General: No scleral icterus. Cardiovascular:     Rate and Rhythm: Normal rate and regular rhythm.     Heart sounds: Normal heart sounds.  Pulmonary:     Effort: Pulmonary effort is normal. No respiratory distress.     Breath sounds: No wheezing.  Abdominal:     General: Bowel sounds are normal. There is no distension.     Palpations: Abdomen is soft.  Musculoskeletal:        General: No deformity. Normal range of motion.     Cervical back: Normal range of motion and neck supple.  Skin:    General: Skin is warm and dry.     Findings: No erythema or rash.  Neurological:     Mental Status: He is alert and oriented to person,  place, and time. Mental status is at baseline.     Cranial Nerves: No cranial nerve deficit.     Coordination: Coordination normal.  Psychiatric:        Mood and Affect: Mood normal.    LABORATORY DATA:  I have reviewed the data as listed Lab Results  Component Value Date   WBC 5.5 03/07/2021   HGB 12.3 (L) 03/07/2021   HCT 37.1 (L) 03/07/2021   MCV 87.7 03/07/2021  PLT 162 03/07/2021   Recent Labs    03/28/20 0421 03/29/20 0444 03/30/20 0536 04/16/20 1525 08/25/20 1512 02/07/21 0836 02/21/21 0848 03/07/21 0842  NA 133* 139 140  --    < > 139 139 136  K 4.1 4.0 4.1  --    < > 3.8 3.8 3.5  CL 101 103 102  --    < > 105 104 102  CO2 _0 --    < > _1 GLUCOSE 116* 101* 99  --    < > 131* 132* 151*  BUN _2 --    < > _3 CREATININE 0.76 0.85 0.76  --    < > 1.23 1.35* 1.24  CALCIUM 8.2* 8.4* 8.6*  --    < > 9.2 9.4 9.1  GFRNONAA >60 >60 >60  --    < > >60 59* >60  GFRAA >60 >60 >60  --   --   --   --   --   PROT  --   --   --  7.0   < > 6.8 7.1 7.6  ALBUMIN  --   --   --  4.3   < > 3.9 3.9 4.2  AST  --   --   --  17   < > 40 78* 54*  ALT  --   --   --  19   < > 24 42 19  ALKPHOS  --   --   --  123*   < > 75 67 72  BILITOT  --   --   --  0.4   < > 0.6 0.5 0.5  BILIDIR  --   --   --  0.10  --   --   --   --    < > = values in this interval not displayed.    Iron/TIBC/Ferritin/ %Sat    Component Value Date/Time   IRON 48 08/30/2020 1052   TIBC 391 08/30/2020 1052   FERRITIN 25 08/30/2020 1052   IRONPCTSAT 12 (L) 08/30/2020 1052      RADIOGRAPHIC STUDIES: I have personally reviewed the radiological images as listed and agreed with the findings in the report. DG Chest 2 View  Result Date: 01/14/2021 CLINICAL DATA:  Tachycardia.  History of lung cancer. EXAM: CHEST - 2 VIEW COMPARISON:  09/14/2020 and CT of the chest on 12/25/2020 FINDINGS: Patient has RIGHT-sided PowerPort, tip to the superior vena cava. There is no pneumothorax. Cardiac  mediastinal silhouette is normal. There is minimal focal patchy opacity in the RIGHT UPPER lobe, consistent with atelectasis or early infiltrate. No frank consolidations. No evidence for pulmonary edema. No pleural effusions. Remote cervical spine fusion. IMPRESSION: 1. RIGHT-sided power port. 2. RIGHT UPPER lobe infiltrate or atelectasis. Electronically Signed   By: Nolon Nations M.D.   On: 01/14/2021 11:16   CT Chest W Contrast  Result Date: 12/26/2020 CLINICAL DATA:  Restaging lung cancer. EXAM: CT CHEST WITH CONTRAST TECHNIQUE: Multidetector CT imaging of the chest was performed during intravenous contrast administration. CONTRAST:  43m OMNIPAQUE IOHEXOL 300 MG/ML  SOLN COMPARISON:  PET-CT 09/18/2020 FINDINGS: Cardiovascular: The heart size appears within normal limits. No pericardial effusion. Coronary artery calcifications noted. Mediastinum/Nodes: Normal appearance of the thyroid gland. The trachea appears patent and is midline. Normal appearance of the esophagus. High right paratracheal lymph node measures 1 cm, image 38/2. Previously 1.6 cm. Low right paratracheal lymph  node measures 1 cm, image 66/2. Unchanged from previous exam. Lungs/Pleura: No pleural effusion. No airspace consolidation, atelectasis, or pneumothorax. Paraseptal emphysema. Right hilar mass/adenopathy measures 2.1 x 1.3 cm, image 72/2. Previously 2.3 x 2.5 cm. No new pulmonary nodule or mass. Upper Abdomen: No acute abnormality. Musculoskeletal: No chest wall abnormality. No acute or significant osseous findings. IMPRESSION: 1. Interval response to therapy. The right hilar mass/adenopathy has decreased in size in the interval. 2. Stable to decreased size of right paratracheal lymph node. 3. Coronary artery calcifications. 4. Emphysema and aortic atherosclerosis. Aortic Atherosclerosis (ICD10-I70.0) and Emphysema (ICD10-J43.9). Electronically Signed   By: Kerby Moors M.D.   On: 12/26/2020 09:21   ABI WITH/WO TBI  Result Date:  01/28/2021  LOWER EXTREMITY DOPPLER STUDY Patient Name:  KEEYON PRIVITERA  Date of Exam:   01/17/2021 Medical Rec #: [id number]            Accession #:    [id number] Date of Birth: 1955/07/27           Patient Gender: M Patient Age:   64Y Exam Location:  [facility] Procedure:      VAS [place] ABI WITH/WO TBI Referring Phys: [id number] [place] --------------------------------------------------------------------------------  Indications: Routine f/u. Other Factors: ASO s/p multiple interventions and bypass.                11/15/2019 Right thrombectomy of Fempop insitu graft with added                stent throughout.                 12/01/2019: Aortogram and Selctive Right Lower Extremity                Angiogram. 6 mg of TPA delivered in a lysis catheterfrom the                Origin of the Right Femoral -Popliteal bypass down the proximal                ATA with placement of the lysis.                 12/02/2019: PTA of the Right Posterior Tibial Artery. PTA of the                Right Peroneal Artery. Mechanical Thrombectomy of the Right                Femoral -Popliteal Bypass graft. Mechanical Thrombectomy of the                Right Tibioperoneal Trunk and Posterior Tibial Artery.  Vascular Interventions: Hx of multiple right lower extremity PTA's/stents.                         09/10/2016 Right femoral to below the knee popliteal                         artery bypass graft placement. 5/01 Right bypass graft &                         mid popliteal artery PTA & coil embolectomy of large                         branch of bypass graft. 12/09/16 Right distal bypass  graft PTA/stent and right posterior tibial artery PTA.                         03/23/2020 Rt PTA thrombectomy. Rt Profunda fem artery to                         PTA bypass. Comparison Study: 10/08/2020 Performing Technologist: Charlane Ferretti RT (R)(VS)  Examination Guidelines: A complete evaluation includes at minimum,  Doppler waveform signals and systolic blood pressure reading at the level of bilateral brachial, anterior tibial, and posterior tibial arteries, when vessel segments are accessible. Bilateral testing is considered an integral part of a complete examination. Photoelectric Plethysmograph (PPG) waveforms and toe systolic pressure readings are included as required and additional duplex testing as needed. Limited examinations for reoccurring indications may be performed as noted.  ABI Findings: +---------+------------------+-----+---------+--------+ Right    Rt Pressure (mmHg)IndexWaveform Comment  +---------+------------------+-----+---------+--------+ Brachial 141                                      +---------+------------------+-----+---------+--------+ ATA      145               1.03 triphasic         +---------+------------------+-----+---------+--------+ PTA      153               1.09 triphasic         +---------+------------------+-----+---------+--------+ Great Toe100               0.71 Dampened          +---------+------------------+-----+---------+--------+ +---------+------------------+-----+---------+-------+ Left     Lt Pressure (mmHg)IndexWaveform Comment +---------+------------------+-----+---------+-------+ Brachial 135                                     +---------+------------------+-----+---------+-------+ ATA      155               1.10 triphasic        +---------+------------------+-----+---------+-------+ PTA      143               1.01 triphasic        +---------+------------------+-----+---------+-------+ Great Toe98                0.70 Normal           +---------+------------------+-----+---------+-------+ +-------+-----------+-----------+------------+------------+ ABI/TBIToday's ABIToday's TBIPrevious ABIPrevious TBI +-------+-----------+-----------+------------+------------+ Right  1.09       .71        1.05        .73           +-------+-----------+-----------+------------+------------+ Left   1.10       .70        1.19        1.06         +-------+-----------+-----------+------------+------------+ Bilateral ABIs appear essentially unchanged compared to prior study on 10/08/2020. Bilateral TBIs appear essentially unchanged compared to prior study on 10/08/2020.  Summary: Right: Resting right ankle-brachial index is within normal range. No evidence of significant right lower extremity arterial disease. The right toe-brachial index is normal. Left: Resting left ankle-brachial index is within normal range. No evidence of significant left lower extremity arterial disease. The left toe-brachial index is normal. *See table(s) above for measurements and observations.  Electronically  signed by [name] MD on 01/28/2021 at 1:12:33 PM.    Final        ASSESSMENT & PLAN:  1. Squamous cell carcinoma of right lung ([place])   2. Encounter for antineoplastic immunotherapy   3. Hypothyroidism due to medication   Cancer Staging Squamous cell carcinoma of right lung [facility]) Staging form: Lung, AJCC 8th Edition - Clinical stage from 09/20/2020: Stage IIIA (cT1b, cN2, cM0) - Signed by [name], MD on 09/20/2020   #Squamous cell carcinoma of right lung, stage IIIA, Labs reviewed and discussed with patient.  Proceed with maintenance durvalumab today.  #Hypertension, blood pressure is trending higher.  Recommend patient to monitor BP at home and if systolic blood pressure progressively increase, he may resume lisinopril.   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  Patient has been started on 25 MCG Synthroid. TSH slightly decreased to 232.  T4 is pending. Recommend patient to increase Synthroid to 50 MCG daily.   All questions were answered. The patient knows to call the clinic with any problems questions or concerns.  cc [name], MD   Return of visit: 2 weeks  [name],  MD, PhD Hematology Oncology [facility] Pager- [phone] 03/07/2021

## 2021-03-08 LAB — T4: T4, Total: 2.3 ug/dL — ABNORMAL LOW (ref 4.5–12.0)

## 2021-03-21 ENCOUNTER — Encounter: Payer: Self-pay | Admitting: Oncology

## 2021-03-21 ENCOUNTER — Inpatient Hospital Stay: Payer: Medicare Other

## 2021-03-21 ENCOUNTER — Other Ambulatory Visit: Payer: Self-pay

## 2021-03-21 ENCOUNTER — Inpatient Hospital Stay (HOSPITAL_BASED_OUTPATIENT_CLINIC_OR_DEPARTMENT_OTHER): Payer: Medicare Other | Admitting: Oncology

## 2021-03-21 ENCOUNTER — Inpatient Hospital Stay: Payer: Medicare Other | Attending: Oncology

## 2021-03-21 VITALS — BP 130/80 | HR 71

## 2021-03-21 VITALS — BP 153/93 | HR 79 | Temp 96.3°F | Resp 18 | Wt 202.3 lb

## 2021-03-21 DIAGNOSIS — E032 Hypothyroidism due to medicaments and other exogenous substances: Secondary | ICD-10-CM | POA: Diagnosis not present

## 2021-03-21 DIAGNOSIS — Z79899 Other long term (current) drug therapy: Secondary | ICD-10-CM | POA: Diagnosis not present

## 2021-03-21 DIAGNOSIS — C3491 Malignant neoplasm of unspecified part of right bronchus or lung: Secondary | ICD-10-CM | POA: Diagnosis not present

## 2021-03-21 DIAGNOSIS — Z5112 Encounter for antineoplastic immunotherapy: Secondary | ICD-10-CM | POA: Insufficient documentation

## 2021-03-21 DIAGNOSIS — C3411 Malignant neoplasm of upper lobe, right bronchus or lung: Secondary | ICD-10-CM | POA: Diagnosis present

## 2021-03-21 DIAGNOSIS — Z452 Encounter for adjustment and management of vascular access device: Secondary | ICD-10-CM | POA: Diagnosis not present

## 2021-03-21 DIAGNOSIS — I10 Essential (primary) hypertension: Secondary | ICD-10-CM | POA: Diagnosis not present

## 2021-03-21 LAB — CBC WITH DIFFERENTIAL/PLATELET
Abs Immature Granulocytes: 0.01 10*3/uL (ref 0.00–0.07)
Basophils Absolute: 0.1 10*3/uL (ref 0.0–0.1)
Basophils Relative: 1 %
Eosinophils Absolute: 0.2 10*3/uL (ref 0.0–0.5)
Eosinophils Relative: 2 %
HCT: 39.5 % (ref 39.0–52.0)
Hemoglobin: 13 g/dL (ref 13.0–17.0)
Immature Granulocytes: 0 %
Lymphocytes Relative: 15 %
Lymphs Abs: 1 10*3/uL (ref 0.7–4.0)
MCH: 28.5 pg (ref 26.0–34.0)
MCHC: 32.9 g/dL (ref 30.0–36.0)
MCV: 86.6 fL (ref 80.0–100.0)
Monocytes Absolute: 0.6 10*3/uL (ref 0.1–1.0)
Monocytes Relative: 8 %
Neutro Abs: 5 10*3/uL (ref 1.7–7.7)
Neutrophils Relative %: 74 %
Platelets: 206 10*3/uL (ref 150–400)
RBC: 4.56 MIL/uL (ref 4.22–5.81)
RDW: 14.3 % (ref 11.5–15.5)
WBC: 6.8 10*3/uL (ref 4.0–10.5)
nRBC: 0 % (ref 0.0–0.2)

## 2021-03-21 LAB — COMPREHENSIVE METABOLIC PANEL
ALT: 14 U/L (ref 0–44)
AST: 26 U/L (ref 15–41)
Albumin: 4.3 g/dL (ref 3.5–5.0)
Alkaline Phosphatase: 78 U/L (ref 38–126)
Anion gap: 10 (ref 5–15)
BUN: 16 mg/dL (ref 8–23)
CO2: 27 mmol/L (ref 22–32)
Calcium: 9.1 mg/dL (ref 8.9–10.3)
Chloride: 101 mmol/L (ref 98–111)
Creatinine, Ser: 1.14 mg/dL (ref 0.61–1.24)
GFR, Estimated: >60 mL/min (ref >60)
Glucose, Bld: 139 mg/dL — ABNORMAL HIGH (ref 70–99)
Potassium: 3.6 mmol/L (ref 3.5–5.1)
Sodium: 138 mmol/L (ref 135–145)
Total Bilirubin: 0.5 mg/dL (ref 0.3–1.2)
Total Protein: 7.8 g/dL (ref 6.5–8.1)

## 2021-03-21 MED ORDER — SODIUM CHLORIDE 0.9 % IV SOLN
Freq: Once | INTRAVENOUS | Status: AC
  Filled 2021-03-21: qty 250

## 2021-03-21 MED ORDER — HEPARIN SOD (PORK) LOCK FLUSH 100 UNIT/ML IV SOLN
500.0000 [IU] | Freq: Once | INTRAVENOUS | Status: AC | PRN
  Administered 2021-03-21: 500 [IU]
  Filled 2021-03-21: qty 5

## 2021-03-21 MED ORDER — SODIUM CHLORIDE 0.9 % IV SOLN
10.0000 mg/kg | Freq: Once | INTRAVENOUS | Status: AC
  Administered 2021-03-21: 860 mg via INTRAVENOUS
  Filled 2021-03-21: qty 17.2

## 2021-03-21 MED ORDER — LEVOTHYROXINE SODIUM 50 MCG PO TABS: 50.0000 ug | ORAL_TABLET | Freq: Every day | ORAL | 0 refills | Status: DC

## 2021-03-21 NOTE — Patient Instructions (Signed)
[facility]  Discharge Instructions: Thank you for choosing [facility] to provide your oncology and hematology care.  If you have a lab appointment with the Cancer Center, please go directly to the Cancer Center and check in at the registration area.  Wear comfortable clothing and clothing appropriate for easy access to any Portacath or PICC line.   We strive to give you quality time with your provider. You may need to reschedule your appointment if you arrive late (15 or more minutes).  Arriving late affects you and other patients whose appointments are after yours.  Also, if you miss three or more appointments without notifying the office, you may be dismissed from the clinic at the provider's discretion.      For prescription refill requests, have your pharmacy contact our office and allow 72 hours for refills to be completed.    Today you received the following chemotherapy and/or immunotherapy agents       To help prevent nausea and vomiting after your treatment, we encourage you to take your nausea medication as directed.  BELOW ARE SYMPTOMS THAT SHOULD BE REPORTED IMMEDIATELY: *FEVER GREATER THAN 100.4 F (38 C) OR HIGHER *CHILLS OR SWEATING *NAUSEA AND VOMITING THAT IS NOT CONTROLLED WITH YOUR NAUSEA MEDICATION *UNUSUAL SHORTNESS OF BREATH *UNUSUAL BRUISING OR BLEEDING *URINARY PROBLEMS (pain or burning when urinating, or frequent urination) *BOWEL PROBLEMS (unusual diarrhea, constipation, pain near the anus) TENDERNESS IN MOUTH AND THROAT WITH OR WITHOUT PRESENCE OF ULCERS (sore throat, sores in mouth, or a toothache) UNUSUAL RASH, SWELLING OR PAIN  UNUSUAL VAGINAL DISCHARGE OR ITCHING   Items with * indicate a potential emergency and should be followed up as soon as possible or go to the Emergency Department if any problems should occur.  Please show the CHEMOTHERAPY ALERT CARD or IMMUNOTHERAPY ALERT CARD at check-in to the Emergency  Department and triage nurse.  Should you have questions after your visit or need to cancel or reschedule your appointment, please contact [facility]  [phone] and follow the prompts.  Office hours are 8:00 a.m. to 4:30 p.m. Monday - Friday. Please note that voicemails left after 4:00 p.m. may not be returned until the following business day.  We are closed weekends and major holidays. You have access to a nurse at all times for urgent questions. Please call the main number to the clinic [phone] and follow the prompts.  For any non-urgent questions, you may also contact your provider using MyChart. We now offer e-Visits for anyone 18 and older to request care online for non-urgent symptoms. For details visit [email or web address].   Also download the MyChart app! Go to the app store, search "MyChart", open the app, select , and log in with your MyChart username and password.  Due to Covid, a mask is required upon entering the hospital/clinic. If you do not have a mask, one will be given to you upon arrival. For doctor visits, patients may have 1 support person aged 18 or older with them. For treatment visits, patients cannot have anyone with them due to current Covid guidelines and our immunocompromised population.  

## 2021-03-21 NOTE — Progress Notes (Signed)
Pt here for follow up. No new concerns voiced.   

## 2021-03-21 NOTE — Progress Notes (Signed)
Hematology/Oncology Progress note  Patient Care Team: [name], MD as PCP - General (Physician Assistant) [name], MD as Referring Physician (Physician Assistant) Bary Castilla, [name], MD (General Surgery) Telford Nab, RN as Oncology Nurse Navigator  REFERRING PROVIDER: [name], MD  CHIEF COMPLAINTS/REASON FOR VISIT:  Follow up for lung cancer  HISTORY OF PRESENTING ILLNESS:   Joshua [name] is a  65 y.o.  male with PMH listed below was seen in consultation at the request of  [name], MD  for evaluation of lung mass  Patient presented to emergency room on 08/25/2020 for evaluation of hemoptysis, coughing.  He coughed up small amount of blood and a large clot.  No fever, chills, chest pain, abdominal pain.  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  Patient is on Plavix and Eliquis.  Since his ER visit, he has not coughed up more blood.  08/25/2020 CT angio chest PE with and without contrast showed ill-defined soft tissue adjacent to the right upper lobe bronchus resulting in narrowing of right upper lobe bronchus.  Also right paratracheal mediastinal adenopathy.  Scattered centrilobular groundglass opacities within the right lower lobe and to a lesser extent within the right lower lobe likely reflecting an infectious or inflammatory process. # 09/14/2020, right upper lobe lung biopsy and bronchoscopy brushing are both positive for squamous cell carcinoma.Right precarinal space lymph node insufficient lymph node sampling for staging. 09/18/2020, PET scan showed right upper lobe nodule maximal SUV 11.5.  Hypermetabolic cluster right paratracheal adenopathy with maximum SUV up to 12.  No findings of metastatic disease to the neck/abdomen/pelvis or skeleton.  # 10/03/2000, MRI brain with and without contrast showed no  evidence of intracranial metastatic disease. 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  # - NGS showed PD-L1 TPS 95%, LRP1B S929fs, PIK3CA E545K, RB1 c1422-2A>T, RIT1 T83_ A84del, STK11 P265fs, TPS53 R248W, TMB 19.15mut/mb, MS stable.   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  Management per ENT   #10/10/2020 started concurrent chemoradiation.  Patient received 4 cycles of carboplatin AUC of 2 and Taxol 45 mg per metered square  Additional chemotherapy was held due to thrombocytopenia, neutropenia Patient finished radiation on 11/27/2020 INTERVAL HISTORY Joshua [name] is a 65 y.o. male who has above history reviewed by me today presents for follow up visit for Stage III lung squamous cell carcinoma. Patient reports feeling well.  Slight fatigue. Patient is taking Synthroid 25 MCG daily.  Tolerates well No nausea vomiting diarrhea skin rash. 03/11/2021, patient was seen by cardiology [facility] for palpitation evaluation.  Patient was recommended to start metoprolol and the patient tried a few days and felt dizzy.  He is off the metoprolol now.  Review of Systems  Constitutional:  Negative for appetite change, chills, fatigue, fever and unexpected weight change.  HENT:   Negative for hearing loss and voice change.   Eyes:  Negative for eye problems and icterus.  Respiratory:  Negative for chest tightness, cough, hemoptysis and shortness of breath.   Cardiovascular:  Negative for chest pain and leg swelling.  Gastrointestinal:  Negative for abdominal distention and abdominal pain.  Endocrine: Negative for hot flashes.  Genitourinary:  Negative for difficulty urinating, dysuria and frequency.   Musculoskeletal:  Negative for arthralgias.  Skin:  Negative for itching  and rash.  Neurological:  Negative for light-headedness and numbness.  Hematological:  Negative for adenopathy. Does  not bruise/bleed easily.  Psychiatric/Behavioral:  Negative for confusion.    MEDICAL HISTORY:  Past Medical History:  Diagnosis Date   Allergy    Cancer ([place])    lung   Dyspnea    former smoker. only doe   History of kidney stones    Hypertension    Peripheral vascular disease ([place])    Pre-diabetes    Sleep apnea     SURGICAL HISTORY: Past Surgical History:  Procedure Laterality Date   ANTERIOR CERVICAL DECOMP/DISCECTOMY FUSION N/A 03/14/2020   Procedure: ANTERIOR CERVICAL DECOMPRESSION/DISCECTOMY FUSION 3 LEVELS C4-7;  Surgeon: Meade Maw, MD;  Location: [facility];  Service: Neurosurgery;  Laterality: N/A;   BACK SURGERY  2011   CENTRAL LINE INSERTION Right 09/10/2016   Procedure: CENTRAL LINE INSERTION;  Surgeon: Katha Cabal, MD;  Location: [facility];  Service: Vascular;  Laterality: Right;   CERVICAL FUSION     C4-c7 on Mar 14, 2020   COLONOSCOPY  2013 ?   CORONARY ANGIOPLASTY     FEMORAL-POPLITEAL BYPASS GRAFT Right 09/10/2016   Procedure: BYPASS GRAFT FEMORAL-POPLITEAL ARTERY ( BELOW KNEE );  Surgeon: Katha Cabal, MD;  Location: [facility];  Service: Vascular;  Laterality: Right;   FEMORAL-TIBIAL BYPASS GRAFT Right 03/23/2020   Procedure: BYPASS GRAFT RIGHT FEMORAL- DISTAL TIBIAL ARTERY WITH DISTAL FLOW GRAFT;  Surgeon: Katha Cabal, MD;  Location: [facility];  Service: Vascular;  Laterality: Right;   LOWER EXTREMITY ANGIOGRAPHY Right 11/18/2016   Procedure: Lower Extremity Angiography;  Surgeon: Katha Cabal, MD;  Location: [place] [facility];  Service: Cardiovascular;  Laterality: Right;   LOWER EXTREMITY ANGIOGRAPHY Right 12/09/2016   Procedure: Lower Extremity Angiography;  Surgeon: Katha Cabal, MD;  Location: [place] [facility];  Service: Cardiovascular;  Laterality: Right;   LOWER EXTREMITY ANGIOGRAPHY Right 11/15/2019   Procedure: LOWER EXTREMITY ANGIOGRAPHY;  Surgeon: Katha Cabal, MD;  Location: [place] [facility];  Service:  Cardiovascular;  Laterality: Right;   LOWER EXTREMITY ANGIOGRAPHY Right 12/01/2019   Procedure: Lower Extremity Angiography;  Surgeon: Algernon Huxley, MD;  Location: [place] [facility];  Service: Cardiovascular;  Laterality: Right;   LOWER EXTREMITY ANGIOGRAPHY Right 12/02/2019   Procedure: LOWER EXTREMITY ANGIOGRAPHY;  Surgeon: Katha Cabal, MD;  Location: [place] [facility];  Service: Cardiovascular;  Laterality: Right;   LOWER EXTREMITY ANGIOGRAPHY Right 03/23/2020   Procedure: Lower Extremity Angiography;  Surgeon: Katha Cabal, MD;  Location: [place] [facility];  Service: Cardiovascular;  Laterality: Right;   LOWER EXTREMITY INTERVENTION  11/18/2016   Procedure: Lower Extremity Intervention;  Surgeon: Katha Cabal, MD;  Location: [place] [facility];  Service: Cardiovascular;;   PERIPHERAL VASCULAR CATHETERIZATION Right 01/02/2015   Procedure: Lower Extremity Angiography;  Surgeon: Katha Cabal, MD;  Location: [place] [facility];  Service: Cardiovascular;  Laterality: Right;   PERIPHERAL VASCULAR CATHETERIZATION Right 06/18/2015   Procedure: Lower Extremity Angiography;  Surgeon: Algernon Huxley, MD;  Location: [place] [facility];  Service: Cardiovascular;  Laterality: Right;   PERIPHERAL VASCULAR CATHETERIZATION  06/18/2015   Procedure: Lower Extremity Intervention;  Surgeon: Algernon Huxley, MD;  Location: [place] [facility];  Service: Cardiovascular;;   PERIPHERAL VASCULAR CATHETERIZATION N/A 10/09/2015   Procedure: Abdominal Aortogram w/Lower Extremity;  Surgeon: Katha Cabal, MD;  Location: [place] [facility];  Service: Cardiovascular;  Laterality:  N/A;   PERIPHERAL VASCULAR CATHETERIZATION  10/09/2015   Procedure: Lower Extremity Intervention;  Surgeon: Katha Cabal, MD;  Location: [place] [facility];  Service: Cardiovascular;;   PERIPHERAL VASCULAR CATHETERIZATION Right 10/10/2015   Procedure: Lower Extremity Angiography;  Surgeon: Algernon Huxley, MD;   Location: [place] [facility];  Service: Cardiovascular;  Laterality: Right;   PERIPHERAL VASCULAR CATHETERIZATION Right 10/10/2015   Procedure: Lower Extremity Intervention;  Surgeon: Algernon Huxley, MD;  Location: [place] [facility];  Service: Cardiovascular;  Laterality: Right;   PERIPHERAL VASCULAR CATHETERIZATION Right 12/03/2015   Procedure: Lower Extremity Angiography;  Surgeon: Algernon Huxley, MD;  Location: [place] [facility];  Service: Cardiovascular;  Laterality: Right;   PERIPHERAL VASCULAR CATHETERIZATION Right 12/04/2015   Procedure: Lower Extremity Angiography;  Surgeon: Algernon Huxley, MD;  Location: [place] [facility];  Service: Cardiovascular;  Laterality: Right;   PERIPHERAL VASCULAR CATHETERIZATION  12/04/2015   Procedure: Lower Extremity Intervention;  Surgeon: Algernon Huxley, MD;  Location: [place] [facility];  Service: Cardiovascular;;   PERIPHERAL VASCULAR CATHETERIZATION Right 06/30/2016   Procedure: Lower Extremity Angiography;  Surgeon: Algernon Huxley, MD;  Location: [place] [facility];  Service: Cardiovascular;  Laterality: Right;   PERIPHERAL VASCULAR CATHETERIZATION Right 06/25/2016   Procedure: Lower Extremity Angiography;  Surgeon: Katha Cabal, MD;  Location: [place] [facility];  Service: Cardiovascular;  Laterality: Right;   PERIPHERAL VASCULAR CATHETERIZATION Right 07/01/2016   Procedure: Lower Extremity Angiography;  Surgeon: Katha Cabal, MD;  Location: [place] [facility];  Service: Cardiovascular;  Laterality: Right;   PERIPHERAL VASCULAR CATHETERIZATION  07/01/2016   Procedure: Lower Extremity Intervention;  Surgeon: Katha Cabal, MD;  Location: [place] [facility];  Service: Cardiovascular;;   PERIPHERAL VASCULAR CATHETERIZATION Right 08/01/2016   Procedure: Lower Extremity Angiography;  Surgeon: Katha Cabal, MD;  Location: [place] [facility];  Service: Cardiovascular;  Laterality: Right;   PORTA CATH INSERTION N/A 10/05/2020   Procedure:  PORTA CATH INSERTION;  Surgeon: Katha Cabal, MD;  Location: [place] [facility];  Service: Cardiovascular;  Laterality: N/A;   stent placement in right leg Right    VIDEO BRONCHOSCOPY N/A 09/14/2020   Procedure: VIDEO BRONCHOSCOPY WITH FLUORO;  Surgeon: Tyler Pita, MD;  Location: [facility];  Service: Cardiopulmonary;  Laterality: N/A;   VIDEO BRONCHOSCOPY WITH ENDOBRONCHIAL ULTRASOUND N/A 09/14/2020   Procedure: VIDEO BRONCHOSCOPY WITH ENDOBRONCHIAL ULTRASOUND;  Surgeon: Tyler Pita, MD;  Location: [facility];  Service: Cardiopulmonary;  Laterality: N/A;    SOCIAL HISTORY: Social History   Socioeconomic History   Marital status: Single    Spouse name: Not on file   Number of children: Not on file   Years of education: Not on file   Highest education level: Not on file  Occupational History   Occupation: unemployed  Tobacco Use   Smoking status: Former    Packs/day: 2.00    Years: 38.00    Pack years: 76.00    Types: Cigarettes    Quit date: 12/20/2014    Years since quitting: 6.2   Smokeless tobacco: Former   Tobacco comments:    quit smoking in 2017 most smoked 2   Vaping Use   Vaping Use: Never used  Substance and Sexual Activity   Alcohol use: Not Currently   Drug use: No   Sexual activity: Yes  Other Topics Concern   Not on file  Social History Narrative   Not  on file   Social Determinants of Health   Financial Resource Strain: Not on file  Food Insecurity: Not on file  Transportation Needs: Not on file  Physical Activity: Not on file  Stress: Not on file  Social Connections: Not on file  Intimate Partner Violence: Not on file    FAMILY HISTORY: Family History  Problem Relation Age of Onset   Diabetes Mother    Hypertension Mother    Heart murmur Mother    Leukemia Mother    Throat cancer Maternal Grandmother    Colon cancer Neg Hx    Breast cancer Neg Hx     ALLERGIES:  is allergic to tape.  MEDICATIONS:  Current Outpatient  Medications  Medication Sig Dispense Refill   acetaminophen (TYLENOL) 325 MG tablet Take 650 mg by mouth every 6 (six) hours as needed for moderate pain.     clopidogrel (PLAVIX) 75 MG tablet TAKE 1 TABLET BY MOUTH  DAILY 90 tablet 3   ELIQUIS 5 MG TABS tablet TAKE 1 TABLET BY MOUTH  TWICE DAILY 180 tablet 3   gabapentin (NEURONTIN) 300 MG capsule Take 2 capsules (600 mg total) by mouth 3 (three) times daily. (Patient taking differently: Take 900 mg by mouth at bedtime as needed (pain).) 540 capsule 1   levothyroxine (SYNTHROID) 25 MCG tablet Take 1 tablet (25 mcg total) by mouth daily before breakfast. 30 tablet 0   lidocaine-prilocaine (EMLA) cream Apply to affected area once 30 g 3   simvastatin (ZOCOR) 40 MG tablet Take 1 tablet by mouth at bedtime.     No current facility-administered medications for this visit.     PHYSICAL EXAMINATION: ECOG PERFORMANCE STATUS: 1 - Symptomatic but completely ambulatory Vitals:   03/21/21 1008  BP: (!) 153/93  Pulse: 79  Resp: 18  Temp: (!) 96.3 F (35.7 C)  SpO2: 100%   Filed Weights   03/21/21 1008  Weight: 202 lb 4.4 oz (91.7 kg)    Physical Exam Constitutional:      General: He is not in acute distress.    Comments: Walks with a cane.   HENT:     Head: Normocephalic and atraumatic.  Eyes:     General: No scleral icterus. Cardiovascular:     Rate and Rhythm: Normal rate and regular rhythm.     Heart sounds: Normal heart sounds.  Pulmonary:     Effort: Pulmonary effort is normal. No respiratory distress.     Breath sounds: No wheezing.  Abdominal:     General: Bowel sounds are normal. There is no distension.     Palpations: Abdomen is soft.  Musculoskeletal:        General: No deformity. Normal range of motion.     Cervical back: Normal range of motion and neck supple.  Skin:    General: Skin is warm and dry.     Findings: No erythema or rash.  Neurological:     Mental Status: He is alert and oriented to person, place, and  time. Mental status is at baseline.     Cranial Nerves: No cranial nerve deficit.     Coordination: Coordination normal.  Psychiatric:        Mood and Affect: Mood normal.    LABORATORY DATA:  I have reviewed the data as listed Lab Results  Component Value Date   WBC 6.8 03/21/2021   HGB 13.0 03/21/2021   HCT 39.5 03/21/2021   MCV 86.6 03/21/2021   PLT 206 03/21/2021  Recent Labs    03/28/20 0421 03/29/20 0444 03/30/20 0536 04/16/20 1525 08/25/20 1512 02/21/21 0848 03/07/21 0842 03/21/21 0955  NA 133* 139 140  --    < > 139 136 138  K 4.1 4.0 4.1  --    < > 3.8 3.5 3.6  CL 101 103 102  --    < > 104 102 101  CO2 $Re'28 28 28  'IPN$ --    < > $R'28 25 27  'nf$ GLUCOSE 116* 101* 99  --    < > 132* 151* 139*  BUN $Re'11 12 12  'YnL$ --    < > $R'17 18 16  'Qa$ CREATININE 0.76 0.85 0.76  --    < > 1.35* 1.24 1.14  CALCIUM 8.2* 8.4* 8.6*  --    < > 9.4 9.1 9.1  GFRNONAA >60 >60 >60  --    < > 59* >60 >60  GFRAA >60 >60 >60  --   --   --   --   --   PROT  --   --   --  7.0   < > 7.1 7.6 7.8  ALBUMIN  --   --   --  4.3   < > 3.9 4.2 4.3  AST  --   --   --  17   < > 78* 54* 26  ALT  --   --   --  19   < > 42 19 14  ALKPHOS  --   --   --  123*   < > 67 72 78  BILITOT  --   --   --  0.4   < > 0.5 0.5 0.5  BILIDIR  --   --   --  0.10  --   --   --   --    < > = values in this interval not displayed.    Iron/TIBC/Ferritin/ %Sat    Component Value Date/Time   IRON 48 08/30/2020 1052   TIBC 391 08/30/2020 1052   FERRITIN 25 08/30/2020 1052   IRONPCTSAT 12 (L) 08/30/2020 1052      RADIOGRAPHIC STUDIES: I have personally reviewed the radiological images as listed and agreed with the findings in the report. DG Chest 2 View  Result Date: 01/14/2021 CLINICAL DATA:  Tachycardia.  History of lung cancer. EXAM: CHEST - 2 VIEW COMPARISON:  09/14/2020 and CT of the chest on 12/25/2020 FINDINGS: Patient has RIGHT-sided PowerPort, tip to the superior vena cava. There is no pneumothorax. Cardiac mediastinal  silhouette is normal. There is minimal focal patchy opacity in the RIGHT UPPER lobe, consistent with atelectasis or early infiltrate. No frank consolidations. No evidence for pulmonary edema. No pleural effusions. Remote cervical spine fusion. IMPRESSION: 1. RIGHT-sided power port. 2. RIGHT UPPER lobe infiltrate or atelectasis. Electronically Signed   By: Nolon Nations M.D.   On: 01/14/2021 11:16   CT Chest W Contrast  Result Date: 12/26/2020 CLINICAL DATA:  Restaging lung cancer. EXAM: CT CHEST WITH CONTRAST TECHNIQUE: Multidetector CT imaging of the chest was performed during intravenous contrast administration. CONTRAST:  83mL OMNIPAQUE IOHEXOL 300 MG/ML  SOLN COMPARISON:  PET-CT 09/18/2020 FINDINGS: Cardiovascular: The heart size appears within normal limits. No pericardial effusion. Coronary artery calcifications noted. Mediastinum/Nodes: Normal appearance of the thyroid gland. The trachea appears patent and is midline. Normal appearance of the esophagus. High right paratracheal lymph node measures 1 cm, image 38/2. Previously 1.6 cm. Low right paratracheal lymph node measures 1 cm, image  66/2. Unchanged from previous exam. Lungs/Pleura: No pleural effusion. No airspace consolidation, atelectasis, or pneumothorax. Paraseptal emphysema. Right hilar mass/adenopathy measures 2.1 x 1.3 cm, image 72/2. Previously 2.3 x 2.5 cm. No new pulmonary nodule or mass. Upper Abdomen: No acute abnormality. Musculoskeletal: No chest wall abnormality. No acute or significant osseous findings. IMPRESSION: 1. Interval response to therapy. The right hilar mass/adenopathy has decreased in size in the interval. 2. Stable to decreased size of right paratracheal lymph node. 3. Coronary artery calcifications. 4. Emphysema and aortic atherosclerosis. Aortic Atherosclerosis (ICD10-I70.0) and Emphysema (ICD10-J43.9). Electronically Signed   By: Kerby Moors M.D.   On: 12/26/2020 09:21   ABI WITH/WO TBI  Result Date: 01/28/2021   LOWER EXTREMITY DOPPLER STUDY Patient Name:  NICHOLAI WILLETTE  Date of Exam:   01/17/2021 Medical Rec #: [id number]            Accession #:    [id number] Date of Birth: 1955-07-30           Patient Gender: M Patient Age:   64Y Exam Location:  [facility] Procedure:      VAS [place] ABI WITH/WO TBI Referring Phys: [id number] [place] --------------------------------------------------------------------------------  Indications: Routine f/u. Other Factors: ASO s/p multiple interventions and bypass.                11/15/2019 Right thrombectomy of Fempop insitu graft with added                stent throughout.                 12/01/2019: Aortogram and Selctive Right Lower Extremity                Angiogram. 6 mg of TPA delivered in a lysis catheterfrom the                Origin of the Right Femoral -Popliteal bypass down the proximal                ATA with placement of the lysis.                 12/02/2019: PTA of the Right Posterior Tibial Artery. PTA of the                Right Peroneal Artery. Mechanical Thrombectomy of the Right                Femoral -Popliteal Bypass graft. Mechanical Thrombectomy of the                Right Tibioperoneal Trunk and Posterior Tibial Artery.  Vascular Interventions: Hx of multiple right lower extremity PTA's/stents.                         09/10/2016 Right femoral to below the knee popliteal                         artery bypass graft placement. 5/01 Right bypass graft &                         mid popliteal artery PTA & coil embolectomy of large                         branch of bypass graft. 12/09/16 Right distal bypass  graft PTA/stent and right posterior tibial artery PTA.                         03/23/2020 Rt PTA thrombectomy. Rt Profunda fem artery to                         PTA bypass. Comparison Study: 10/08/2020 Performing Technologist: Charlane Ferretti RT (R)(VS)  Examination Guidelines: A complete evaluation includes at minimum, Doppler waveform  signals and systolic blood pressure reading at the level of bilateral brachial, anterior tibial, and posterior tibial arteries, when vessel segments are accessible. Bilateral testing is considered an integral part of a complete examination. Photoelectric Plethysmograph (PPG) waveforms and toe systolic pressure readings are included as required and additional duplex testing as needed. Limited examinations for reoccurring indications may be performed as noted.  ABI Findings: +---------+------------------+-----+---------+--------+ Right    Rt Pressure (mmHg)IndexWaveform Comment  +---------+------------------+-----+---------+--------+ Brachial 141                                      +---------+------------------+-----+---------+--------+ ATA      145               1.03 triphasic         +---------+------------------+-----+---------+--------+ PTA      153               1.09 triphasic         +---------+------------------+-----+---------+--------+ Great Toe100               0.71 Dampened          +---------+------------------+-----+---------+--------+ +---------+------------------+-----+---------+-------+ Left     Lt Pressure (mmHg)IndexWaveform Comment +---------+------------------+-----+---------+-------+ Brachial 135                                     +---------+------------------+-----+---------+-------+ ATA      155               1.10 triphasic        +---------+------------------+-----+---------+-------+ PTA      143               1.01 triphasic        +---------+------------------+-----+---------+-------+ Great Toe98                0.70 Normal           +---------+------------------+-----+---------+-------+ +-------+-----------+-----------+------------+------------+ ABI/TBIToday's ABIToday's TBIPrevious ABIPrevious TBI +-------+-----------+-----------+------------+------------+ Right  1.09       .71        1.05        .73           +-------+-----------+-----------+------------+------------+ Left   1.10       .70        1.19        1.06         +-------+-----------+-----------+------------+------------+ Bilateral ABIs appear essentially unchanged compared to prior study on 10/08/2020. Bilateral TBIs appear essentially unchanged compared to prior study on 10/08/2020.  Summary: Right: Resting right ankle-brachial index is within normal range. No evidence of significant right lower extremity arterial disease. The right toe-brachial index is normal. Left: Resting left ankle-brachial index is within normal range. No evidence of significant left lower extremity arterial disease. The left toe-brachial index is normal. *See table(s) above for measurements and observations.  Electronically  signed by [name] MD on 01/28/2021 at 1:12:33 PM.    Final        ASSESSMENT & PLAN:  1. Squamous cell carcinoma of right lung ([place])   2. Encounter for antineoplastic immunotherapy   3. Hypothyroidism due to medication   Cancer Staging Squamous cell carcinoma of right lung [facility]) Staging form: Lung, AJCC 8th Edition - Clinical stage from 09/20/2020: Stage IIIA (cT1b, cN2, cM0) - Signed by [name], MD on 09/20/2020   #Squamous cell carcinoma of right lung, stage IIIA, Labs reviewed and discussed with patient. Proceed with Durvalumab today. Obtain CT chest without contrast for surveillance.  #Hypertension, Continue lisinopril.  Metoprolol was added for palpitation and the patient cannot tolerate.  Advised patient to call cardiologist office and update his symptoms.  I will defer management to cardiology.  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  Recommend patient to increase Synthroid to 50 MCG daily.   All questions were answered. The patient knows to call the clinic with any problems questions or concerns.  cc [name], MD   Return of visit: 2 weeks lab MD Durvalumab.  [name],  MD, PhD Hematology Oncology [place] at [facility]  03/21/2021

## 2021-03-28 ENCOUNTER — Other Ambulatory Visit: Payer: Medicare Other

## 2021-03-28 ENCOUNTER — Other Ambulatory Visit: Payer: Self-pay

## 2021-03-28 ENCOUNTER — Ambulatory Visit: Payer: Medicare Other | Admitting: Oncology

## 2021-03-28 ENCOUNTER — Ambulatory Visit: Payer: Medicare Other

## 2021-03-28 ENCOUNTER — Ambulatory Visit
Admission: RE | Admit: 2021-03-28 | Discharge: 2021-03-28 | Disposition: A | Discharge status: Home / Self Care | Payer: Medicare Other | Source: Ambulatory Visit | Attending: Oncology | Admitting: Oncology

## 2021-03-28 DIAGNOSIS — C3491 Malignant neoplasm of unspecified part of right bronchus or lung: Secondary | ICD-10-CM | POA: Insufficient documentation

## 2021-04-04 ENCOUNTER — Inpatient Hospital Stay (HOSPITAL_BASED_OUTPATIENT_CLINIC_OR_DEPARTMENT_OTHER): Payer: Medicare Other | Admitting: Oncology

## 2021-04-04 ENCOUNTER — Ambulatory Visit: Payer: Medicare Other

## 2021-04-04 ENCOUNTER — Telehealth: Payer: Self-pay

## 2021-04-04 ENCOUNTER — Inpatient Hospital Stay: Payer: Medicare Other

## 2021-04-04 ENCOUNTER — Encounter: Payer: Self-pay | Admitting: Oncology

## 2021-04-04 ENCOUNTER — Encounter: Payer: Self-pay | Admitting: Pulmonary Disease

## 2021-04-04 ENCOUNTER — Ambulatory Visit (INDEPENDENT_AMBULATORY_CARE_PROVIDER_SITE_OTHER): Payer: Medicare Other | Admitting: Pulmonary Disease

## 2021-04-04 ENCOUNTER — Other Ambulatory Visit: Payer: Self-pay

## 2021-04-04 VITALS — BP 147/84 | HR 73 | Temp 97.1°F | Resp 16 | Wt 202.0 lb

## 2021-04-04 VITALS — BP 150/88 | HR 100 | Temp 97.3°F | Ht 71.0 in | Wt 202.2 lb

## 2021-04-04 DIAGNOSIS — E032 Hypothyroidism due to medicaments and other exogenous substances: Secondary | ICD-10-CM

## 2021-04-04 DIAGNOSIS — Z87891 Personal history of nicotine dependence: Secondary | ICD-10-CM

## 2021-04-04 DIAGNOSIS — J449 Chronic obstructive pulmonary disease, unspecified: Secondary | ICD-10-CM | POA: Diagnosis not present

## 2021-04-04 DIAGNOSIS — J8489 Other specified interstitial pulmonary diseases: Secondary | ICD-10-CM | POA: Diagnosis not present

## 2021-04-04 DIAGNOSIS — Z5112 Encounter for antineoplastic immunotherapy: Secondary | ICD-10-CM | POA: Diagnosis not present

## 2021-04-04 DIAGNOSIS — J700 Acute pulmonary manifestations due to radiation: Secondary | ICD-10-CM

## 2021-04-04 DIAGNOSIS — C3491 Malignant neoplasm of unspecified part of right bronchus or lung: Secondary | ICD-10-CM

## 2021-04-04 HISTORY — DX: Hypothyroidism due to medicaments and other exogenous substances: E03.2

## 2021-04-04 LAB — CBC WITH DIFFERENTIAL/PLATELET
Abs Immature Granulocytes: 0.02 10*3/uL (ref 0.00–0.07)
Basophils Absolute: 0.1 10*3/uL (ref 0.0–0.1)
Basophils Relative: 1 %
Eosinophils Absolute: 0.2 10*3/uL (ref 0.0–0.5)
Eosinophils Relative: 3 %
HCT: 34.5 % — ABNORMAL LOW (ref 39.0–52.0)
Hemoglobin: 11.6 g/dL — ABNORMAL LOW (ref 13.0–17.0)
Immature Granulocytes: 0 %
Lymphocytes Relative: 14 %
Lymphs Abs: 0.8 10*3/uL (ref 0.7–4.0)
MCH: 28.9 pg (ref 26.0–34.0)
MCHC: 33.6 g/dL (ref 30.0–36.0)
MCV: 86 fL (ref 80.0–100.0)
Monocytes Absolute: 0.5 10*3/uL (ref 0.1–1.0)
Monocytes Relative: 9 %
Neutro Abs: 3.8 10*3/uL (ref 1.7–7.7)
Neutrophils Relative %: 73 %
Platelets: 168 10*3/uL (ref 150–400)
RBC: 4.01 MIL/uL — ABNORMAL LOW (ref 4.22–5.81)
RDW: 14.3 % (ref 11.5–15.5)
WBC: 5.3 10*3/uL (ref 4.0–10.5)
nRBC: 0 % (ref 0.0–0.2)

## 2021-04-04 LAB — COMPREHENSIVE METABOLIC PANEL
ALT: 14 U/L (ref 0–44)
AST: 24 U/L (ref 15–41)
Albumin: 3.9 g/dL (ref 3.5–5.0)
Alkaline Phosphatase: 70 U/L (ref 38–126)
Anion gap: 6 (ref 5–15)
BUN: 11 mg/dL (ref 8–23)
CO2: 28 mmol/L (ref 22–32)
Calcium: 8.8 mg/dL — ABNORMAL LOW (ref 8.9–10.3)
Chloride: 104 mmol/L (ref 98–111)
Creatinine, Ser: 1.22 mg/dL (ref 0.61–1.24)
GFR, Estimated: >60 mL/min (ref >60)
Glucose, Bld: 134 mg/dL — ABNORMAL HIGH (ref 70–99)
Potassium: 3.4 mmol/L — ABNORMAL LOW (ref 3.5–5.1)
Sodium: 138 mmol/L (ref 135–145)
Total Bilirubin: 0.4 mg/dL (ref 0.3–1.2)
Total Protein: 7.4 g/dL (ref 6.5–8.1)

## 2021-04-04 MED ORDER — HEPARIN SOD (PORK) LOCK FLUSH 100 UNIT/ML IV SOLN
500.0000 [IU] | Freq: Once | INTRAVENOUS | Status: AC
  Administered 2021-04-04: 500 [IU] via INTRAVENOUS
  Filled 2021-04-04: qty 5

## 2021-04-04 MED ORDER — BREZTRI AEROSPHERE 160-9-4.8 MCG/ACT IN AERO: 2.0000 | INHALATION_SPRAY | Freq: Two times a day (BID) | RESPIRATORY_TRACT | 0 refills | Status: DC

## 2021-04-04 NOTE — Progress Notes (Signed)
Patient here today for follow up and treatment. No complaints at this time.

## 2021-04-04 NOTE — Progress Notes (Signed)
Subjective:    Patient ID: Joshua [name], male    DOB: Aug 26, 1955, 65 y.o.   MRN: [id number] Chief Complaint  Patient presents with   Follow-up    Pt states SOB with exertion     HPI Patient is a 65 year old former smoker who presents for evaluation of abnormal CT chest after radiation therapy.  This is a work in visit.  Recall that we follow the patient for COPD severity yet to be determined.  His work-up has been delayed due to diagnosis of squamous cell carcinoma of the right upper lobe for which he underwent bronchoscopy with tumor debridement, biopsy and balloon dilation on 14 August 2020.  He has been followed up by medical and radiation oncology.  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  He received 4 cycles of carboplatin and Taxol and additional chemotherapy was held due to thrombocytopenia and neutropenia.  He received concurrent radiation starting on 23rd March which he completed on 10 May.Total radiation dose (Gy): 70.  Following that he was started on durvalumab as immunotherapy.  He had a chest CT on 26 December 2020 showed interval response to therapy with right hilar mass/adenopathy decreased in size and also decrease in size of the right paratracheal lymph node.  On 29 March 2021 he underwent follow-up CT scan of the chest showed right perihilar consolidation with traction bronchiectasis representing postradiation change however there were some peribronchial nodular groundglass opacities and consolidation seen primarily in the right upper and lower lobes in the expected radiation field likely representing radiation pneumonitis.  The patient has been having some dyspnea on exertion however this has been preceding the findings on the CT.  He had previously been treated empirically with Breztri 2 puffs twice a day however he tells me today that he has not been using this medication even though he found it helpful.  He  states that he has difficulties timing the inhalation and the delivery of the medication.  He had not expressed this problem before.  Does not have any fevers, chills or sweats.  No hemoptysis.  No cough or purulent sputum production.  I reviewed the chest CT with the patient in detail.  She does not voice any other complaint.  Review of Systems A 63 point review of systems was performed and it is as noted above otherwise negative.  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[place]) 54/65/0354   Complication associated with vascular device 11/13/2016   Hyperlipidemia 06/18/2016   Hypertension 06/18/2016   Radiculopathy of lumbar region 06/10/2016   PVD (peripheral vascular disease) ([place]) 05/08/2016   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   clopidogrel (PLAVIX) 75 MG tablet TAKE 1 TABLET BY MOUTH  DAILY   ELIQUIS 5 MG TABS tablet TAKE 1 TABLET BY MOUTH  TWICE DAILY   gabapentin (NEURONTIN) 300 MG capsule Take 2 capsules (600 mg total) by mouth 3 (three) times daily. (Patient taking differently: Take 900 mg by mouth at bedtime as needed (pain).)   levothyroxine (SYNTHROID) 50 MCG tablet Take 1 tablet (50 mcg total) by mouth daily before breakfast.   lidocaine-prilocaine (EMLA) cream Apply to affected area once   simvastatin (ZOCOR) 40 MG tablet Take 1 tablet by mouth at bedtime.   Immunization History  Administered Date(s) Administered   Influenza,inj,Quad PF,6+ Mos 09/14/2016, 04/13/2019, 04/16/2020   PFIZER(Purple Top)SARS-COV-2 Vaccination 10/20/2019, 11/29/2019   Tdap 04/16/2020       Objective:   Physical Exam BP (!) 150/88 (BP Location: Left Arm, Patient Position: Sitting, Cuff Size: Normal)   Pulse 100   Temp (!) 97.3 F (36.3 C) (Temporal)   Ht $R'5\' 11"'xS$  (1.803 m)   Wt 202 lb 3.2 oz (91.7 kg)   SpO2 100%   BMI 28.20 kg/m   GENERAL: Chronically ill-appearing well-developed well-nourished gentleman, no acute distress. Walks with assistance of a cane. HEAD: Normocephalic, atraumatic.  EYES: Pupils equal, round, reactive to light.  No scleral icterus.  MOUTH: Nose/mouth/throat not examined due to masking requirements for COVID 19. NECK: Supple.  Limited ROM due to prior spinal fusion.  No thyromegaly. Trachea midline. No JVD.  No adenopathy. PULMONARY: Good air entry bilaterally.  Few rhonchi, no wheezes. CARDIOVASCULAR: S1 and S2. Regular rate and rhythm.  No rubs, murmurs or gallops heard. ABDOMEN: Benign. MUSCULOSKELETAL: No joint deformity, no clubbing, no edema.  NEUROLOGIC: No overt focal deficit, walks with assistance of a cane, speech is fluent. SKIN: Intact,warm,dry. PSYCH: Mood and  behavior normal.  Ambulatory oximetry performed today: Patient maintained oxygen saturations at 98-100 %.  He described dyspnea of moderate degree.  Heart rate Baseline 80 heart rate after 750 feet ambulated =113.   Representative images from CT scan performed 28 March 2021, independently reviewed:        Assessment & Plan:     ICD-10-CM   1. 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 Squamous cell carcinoma of right lung ([facility])  C34.91 CT CHEST WO CONTRAST   Status post radiation therapy/chemotherapy Has been on immunotherapy    3. COPD, severity to be determined ([place])  J44.9 Budeson-Glycopyrrol-Formoterol (BREZTRI AEROSPHERE) 160-9-4.8 MCG/ACT AERO   PFTs Resume Breztri 2 puffs twice a day Spacer for [employer]    4. Former smoker  Z87.891    No evidence of relapse     Orders Placed This Encounter  Procedures   CT  CHEST WO CONTRAST    Standing Status:   Future    Standing Expiration Date:   04/04/2022    Scheduling Instructions:     4-6weeks    Order Specific Question:   Preferred imaging location?    Answer:   [facility]   Discussion: The patient has radiation pneumonitis however there is very mild case likely grade 1.  He does have some issues with dyspnea on exertion however this preceded the changes noted on CT.  We had not been able to order PFTs previously due to the patient's schedule for chemoradiation for his lung cancer.  We will order PFTs now that his chemotherapy and radiation have been completed.  He had noted improvement on his dyspnea symptoms with use of Breztri.  Because of his difficulty with administering the medication due to poor coordination with inspiration and delivery of the medication he will was provided with a spacer for the medication.  He was taught the proper use of the  medication.  This has inhaled corticosteroid and it and should help with his radiation pneumonitis symptoms.  I suspect that this will be self-limiting.  Would recheck CT scan of the chest in 4 to 6 weeks.  I agree with holding immunotherapy at present however I do not believe that the changes are related to immunotherapy pulmonary toxicity given the pattern on chest CT which follows the radiation field.  We will see the patient in follow-up after his CT scan is performed in 4 to 6 weeks.  He is to contact us prior to that time should he develop any worsening symptoms of dyspnea, chest pain, fevers, cough or sputum production.  Renold Don, MD Advanced Bronchoscopy PCC[facility]    *This note was dictated using voice recognition software/Dragon.  Despite best efforts to proofread, errors can occur which can change the meaning.  Any change was purely unintentional.

## 2021-04-04 NOTE — Telephone Encounter (Signed)
9/29 appointments canceled.  Patient scheduled for 10/27 lab/md/infusion.  Appointment with Dr. Baruch Gouty moved up from 11 to 57.  Patient notified via voicemail and appointment reminder.

## 2021-04-04 NOTE — Telephone Encounter (Signed)
Please schedule and notify pt of appt. Thanks

## 2021-04-04 NOTE — Telephone Encounter (Signed)
-----   Message from [name], MD sent at 04/04/2021 12:24 PM EDT ----- He was seen by pulmonology. He has CT scheduled by them on 10/20  His follow up appt will be lab md +/- duralumab on 10/27 in [place]

## 2021-04-04 NOTE — Progress Notes (Signed)
Hematology/Oncology Progress note  Patient Care Team: [name], MD as PCP - General (Physician Assistant) [name], MD as Referring Physician (Physician Assistant) Bary Castilla, [name], MD (General Surgery) Telford Nab, RN as Oncology Nurse Navigator  REFERRING PROVIDER: [name], MD  CHIEF COMPLAINTS/REASON FOR VISIT:  Follow up for lung cancer  HISTORY OF PRESENTING ILLNESS:   Joshua [name] is a  65 y.o.  male with PMH listed below was seen in consultation at the request of  [name], MD  for evaluation of lung mass  Patient presented to emergency room on 08/25/2020 for evaluation of hemoptysis, coughing.  He coughed up small amount of blood and a large clot.  No fever, chills, chest pain, abdominal pain.  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  Patient is on Plavix and Eliquis.  Since his ER visit, he has not coughed up more blood.  08/25/2020 CT angio chest PE with and without contrast showed ill-defined soft tissue adjacent to the right upper lobe bronchus resulting in narrowing of right upper lobe bronchus.  Also right paratracheal mediastinal adenopathy.  Scattered centrilobular groundglass opacities within the right lower lobe and to a lesser extent within the right lower lobe likely reflecting an infectious or inflammatory process. # 09/14/2020, right upper lobe lung biopsy and bronchoscopy brushing are both positive for squamous cell carcinoma.Right precarinal space lymph node insufficient lymph node sampling for staging. 09/18/2020, PET scan showed right upper lobe nodule maximal SUV 11.5.  Hypermetabolic cluster right paratracheal adenopathy with maximum SUV up to 12.  No findings of metastatic disease to the neck/abdomen/pelvis or skeleton.  # 10/03/2000, MRI brain with and without contrast showed no  evidence of intracranial metastatic disease. 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  # - NGS showed PD-L1 TPS 95%, LRP1B S986fs, PIK3CA E545K, RB1 c1422-2A>T, RIT1 T83_ A84del, STK11 P219fs, TPS53 R248W, TMB 19.63mut/mb, MS stable.   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  Management per ENT   #10/10/2020 started concurrent chemoradiation.  Patient received 4 cycles of carboplatin AUC of 2 and Taxol 45 mg per metered square  Additional chemotherapy was held due to thrombocytopenia, neutropenia Patient finished radiation on 11/27/2020  # 03/11/2021, patient was seen by cardiology [facility] for palpitation evaluation.  Patient was recommended to start metoprolol and the patient tried a few days and felt dizzy.  He is off the metoprolol now.  #Hypertension, Continue lisinopril.  Metoprolol was added for palpitation and the patient cannot tolerate.  Advised patient to call cardiologist office and update his symptoms.  I will defer management to cardiology. INTERVAL HISTORY VIRGINIO [name] is a 65 y.o. male who has above history reviewed by me today presents for follow up visit for Stage III lung squamous cell carcinoma. Patient reports feeling well.  Slight fatigue. Patient is taking Synthroid 50 MCG daily.  Tolerates well No nausea vomiting diarrhea skin rash. Patient has chronic dyspnea with exertion.  He feels not significant changed compared to his baseline.  He currently does not use inhalers previously prescribed by Dr. Patsey Berthold.  Review of Systems  Constitutional:  Negative for appetite change, chills, fatigue, fever and unexpected weight change.  HENT:   Negative for hearing loss and voice change.   Eyes:  Negative for eye problems and icterus.  Respiratory:  Positive for shortness of breath. Negative for chest tightness, cough and hemoptysis.   Cardiovascular:  Negative for  chest pain and leg swelling.  Gastrointestinal:  Negative for abdominal distention and abdominal pain.  Endocrine: Negative for hot flashes.  Genitourinary:  Negative for difficulty urinating, dysuria and frequency.   Musculoskeletal:  Negative for arthralgias.  Skin:  Negative for itching and rash.  Neurological:  Negative for light-headedness and numbness.  Hematological:  Negative for adenopathy. Does not bruise/bleed easily.  Psychiatric/Behavioral:  Negative for confusion.    MEDICAL HISTORY:  Past Medical History:  Diagnosis Date   Allergy    Cancer ([place])    lung   Dyspnea    former smoker. only doe   History of kidney stones    Hypertension    Peripheral vascular disease ([place])    Pre-diabetes    Sleep apnea     SURGICAL HISTORY: Past Surgical History:  Procedure Laterality Date   ANTERIOR CERVICAL DECOMP/DISCECTOMY FUSION N/A 03/14/2020   Procedure: ANTERIOR CERVICAL DECOMPRESSION/DISCECTOMY FUSION 3 LEVELS C4-7;  Surgeon: Meade Maw, MD;  Location: [facility];  Service: Neurosurgery;  Laterality: N/A;   BACK SURGERY  2011   CENTRAL LINE INSERTION Right 09/10/2016   Procedure: CENTRAL LINE INSERTION;  Surgeon: Katha Cabal, MD;  Location: [facility];  Service: Vascular;  Laterality: Right;   CERVICAL FUSION     C4-c7 on Mar 14, 2020   COLONOSCOPY  2013 ?   CORONARY ANGIOPLASTY     FEMORAL-POPLITEAL BYPASS GRAFT Right 09/10/2016   Procedure: BYPASS GRAFT FEMORAL-POPLITEAL ARTERY ( BELOW KNEE );  Surgeon: Katha Cabal, MD;  Location: [facility];  Service: Vascular;  Laterality: Right;   FEMORAL-TIBIAL BYPASS GRAFT Right 03/23/2020   Procedure: BYPASS GRAFT RIGHT FEMORAL- DISTAL TIBIAL ARTERY WITH DISTAL FLOW GRAFT;  Surgeon: Katha Cabal, MD;  Location: [facility];  Service: Vascular;  Laterality: Right;   LOWER EXTREMITY ANGIOGRAPHY Right 11/18/2016   Procedure: Lower Extremity Angiography;  Surgeon: Katha Cabal, MD;  Location: [place] [facility];   Service: Cardiovascular;  Laterality: Right;   LOWER EXTREMITY ANGIOGRAPHY Right 12/09/2016   Procedure: Lower Extremity Angiography;  Surgeon: Katha Cabal, MD;  Location: [place] [facility];  Service: Cardiovascular;  Laterality: Right;   LOWER EXTREMITY ANGIOGRAPHY Right 11/15/2019   Procedure: LOWER EXTREMITY ANGIOGRAPHY;  Surgeon: Katha Cabal, MD;  Location: [place] [facility];  Service: Cardiovascular;  Laterality: Right;   LOWER EXTREMITY ANGIOGRAPHY Right 12/01/2019   Procedure: Lower Extremity Angiography;  Surgeon: Algernon Huxley, MD;  Location: [place] [facility];  Service: Cardiovascular;  Laterality: Right;   LOWER EXTREMITY ANGIOGRAPHY Right 12/02/2019   Procedure: LOWER EXTREMITY ANGIOGRAPHY;  Surgeon: Katha Cabal, MD;  Location: [place] [facility];  Service: Cardiovascular;  Laterality: Right;   LOWER EXTREMITY ANGIOGRAPHY Right 03/23/2020   Procedure: Lower Extremity Angiography;  Surgeon: Katha Cabal, MD;  Location: [place] [facility];  Service: Cardiovascular;  Laterality: Right;   LOWER EXTREMITY INTERVENTION  11/18/2016   Procedure: Lower Extremity Intervention;  Surgeon: Katha Cabal, MD;  Location: [place] [facility];  Service: Cardiovascular;;   PERIPHERAL VASCULAR CATHETERIZATION Right 01/02/2015   Procedure: Lower Extremity Angiography;  Surgeon: Katha Cabal, MD;  Location: [place] [facility];  Service: Cardiovascular;  Laterality: Right;   PERIPHERAL VASCULAR CATHETERIZATION Right 06/18/2015   Procedure: Lower Extremity Angiography;  Surgeon: Algernon Huxley, MD;  Location: [place] [facility];  Service: Cardiovascular;  Laterality: Right;   PERIPHERAL VASCULAR CATHETERIZATION  06/18/2015   Procedure: Lower Extremity Intervention;  Surgeon: Annice Needy, MD;  Location: [facility];  Service: Cardiovascular;;   PERIPHERAL VASCULAR CATHETERIZATION N/A 10/09/2015   Procedure: Abdominal Aortogram w/Lower Extremity;  Surgeon:  Renford Dills, MD;  Location: [facility];  Service: Cardiovascular;  Laterality: N/A;   PERIPHERAL VASCULAR CATHETERIZATION  10/09/2015   Procedure: Lower Extremity Intervention;  Surgeon: Renford Dills, MD;  Location: [facility];  Service: Cardiovascular;;   PERIPHERAL VASCULAR CATHETERIZATION Right 10/10/2015   Procedure: Lower Extremity Angiography;  Surgeon: Annice Needy, MD;  Location: [facility];  Service: Cardiovascular;  Laterality: Right;   PERIPHERAL VASCULAR CATHETERIZATION Right 10/10/2015   Procedure: Lower Extremity Intervention;  Surgeon: Annice Needy, MD;  Location: [facility];  Service: Cardiovascular;  Laterality: Right;   PERIPHERAL VASCULAR CATHETERIZATION Right 12/03/2015   Procedure: Lower Extremity Angiography;  Surgeon: Annice Needy, MD;  Location: [facility];  Service: Cardiovascular;  Laterality: Right;   PERIPHERAL VASCULAR CATHETERIZATION Right 12/04/2015   Procedure: Lower Extremity Angiography;  Surgeon: Annice Needy, MD;  Location: [facility];  Service: Cardiovascular;  Laterality: Right;   PERIPHERAL VASCULAR CATHETERIZATION  12/04/2015   Procedure: Lower Extremity Intervention;  Surgeon: Annice Needy, MD;  Location: [facility];  Service: Cardiovascular;;   PERIPHERAL VASCULAR CATHETERIZATION Right 06/30/2016   Procedure: Lower Extremity Angiography;  Surgeon: Annice Needy, MD;  Location: [facility];  Service: Cardiovascular;  Laterality: Right;   PERIPHERAL VASCULAR CATHETERIZATION Right 06/25/2016   Procedure: Lower Extremity Angiography;  Surgeon: Renford Dills, MD;  Location: [facility];  Service: Cardiovascular;  Laterality: Right;   PERIPHERAL VASCULAR CATHETERIZATION Right 07/01/2016   Procedure: Lower Extremity Angiography;  Surgeon: Renford Dills, MD;  Location: [facility];  Service: Cardiovascular;  Laterality: Right;   PERIPHERAL VASCULAR CATHETERIZATION   07/01/2016   Procedure: Lower Extremity Intervention;  Surgeon: Renford Dills, MD;  Location: [facility];  Service: Cardiovascular;;   PERIPHERAL VASCULAR CATHETERIZATION Right 08/01/2016   Procedure: Lower Extremity Angiography;  Surgeon: Renford Dills, MD;  Location: [facility];  Service: Cardiovascular;  Laterality: Right;   PORTA CATH INSERTION N/A 10/05/2020   Procedure: PORTA CATH INSERTION;  Surgeon: Renford Dills, MD;  Location: [facility];  Service: Cardiovascular;  Laterality: N/A;   stent placement in right leg Right    VIDEO BRONCHOSCOPY N/A 09/14/2020   Procedure: VIDEO BRONCHOSCOPY WITH FLUORO;  Surgeon: Salena Saner, MD;  Location: [facility];  Service: Cardiopulmonary;  Laterality: N/A;   VIDEO BRONCHOSCOPY WITH ENDOBRONCHIAL ULTRASOUND N/A 09/14/2020   Procedure: VIDEO BRONCHOSCOPY WITH ENDOBRONCHIAL ULTRASOUND;  Surgeon: Salena Saner, MD;  Location: [facility];  Service: Cardiopulmonary;  Laterality: N/A;    SOCIAL HISTORY: Social History   Socioeconomic History   Marital status: Single    Spouse name: Not on file   Number of children: Not on file   Years of education: Not on file   Highest education level: Not on file  Occupational History   Occupation: unemployed  Tobacco Use   Smoking status: Former    Packs/day: 2.00    Years: 38.00    Pack years: 76.00    Types: Cigarettes    Quit date: 12/20/2014    Years since quitting: 6.2  Smokeless tobacco: Former   Tobacco comments:    quit smoking in 2017 most smoked 2   Vaping Use   Vaping Use: Never used  Substance and Sexual Activity   Alcohol use: Not Currently   Drug use: No   Sexual activity: Yes  Other Topics Concern   Not on file  Social History Narrative   Not on file   Social Determinants of Health   Financial Resource Strain: Not on file  Food Insecurity: Not on file  Transportation Needs: Not on file  Physical Activity: Not on file  Stress: Not  on file  Social Connections: Not on file  Intimate Partner Violence: Not on file    FAMILY HISTORY: Family History  Problem Relation Age of Onset   Diabetes Mother    Hypertension Mother    Heart murmur Mother    Leukemia Mother    Throat cancer Maternal Grandmother    Colon cancer Neg Hx    Breast cancer Neg Hx     ALLERGIES:  is allergic to tape.  MEDICATIONS:  Current Outpatient Medications  Medication Sig Dispense Refill   acetaminophen (TYLENOL) 325 MG tablet Take 650 mg by mouth every 6 (six) hours as needed for moderate pain.     clopidogrel (PLAVIX) 75 MG tablet TAKE 1 TABLET BY MOUTH  DAILY 90 tablet 3   ELIQUIS 5 MG TABS tablet TAKE 1 TABLET BY MOUTH  TWICE DAILY 180 tablet 3   gabapentin (NEURONTIN) 300 MG capsule Take 2 capsules (600 mg total) by mouth 3 (three) times daily. (Patient taking differently: Take 900 mg by mouth at bedtime as needed (pain).) 540 capsule 1   levothyroxine (SYNTHROID) 50 MCG tablet Take 1 tablet (50 mcg total) by mouth daily before breakfast. 30 tablet 0   lidocaine-prilocaine (EMLA) cream Apply to affected area once 30 g 3   simvastatin (ZOCOR) 40 MG tablet Take 1 tablet by mouth at bedtime.     Budeson-Glycopyrrol-Formoterol (BREZTRI AEROSPHERE) 160-9-4.8 MCG/ACT AERO Inhale 2 puffs into the lungs in the morning and at bedtime. 10.7 g 0   No current facility-administered medications for this visit.     PHYSICAL EXAMINATION: ECOG PERFORMANCE STATUS: 1 - Symptomatic but completely ambulatory Vitals:   04/04/21 0842  BP: (!) 147/84  Pulse: 73  Resp: 16  Temp: (!) 97.1 F (36.2 C)  SpO2: 99%   Filed Weights   04/04/21 0853  Weight: 202 lb (91.6 kg)    Physical Exam Constitutional:      General: He is not in acute distress.    Comments: Walks with a cane.   HENT:     Head: Normocephalic and atraumatic.  Eyes:     General: No scleral icterus. Cardiovascular:     Rate and Rhythm: Normal rate and regular rhythm.     Heart  sounds: Normal heart sounds.  Pulmonary:     Effort: Pulmonary effort is normal. No respiratory distress.     Breath sounds: No wheezing.  Abdominal:     General: Bowel sounds are normal. There is no distension.     Palpations: Abdomen is soft.  Musculoskeletal:        General: No deformity. Normal range of motion.     Cervical back: Normal range of motion and neck supple.  Skin:    General: Skin is warm and dry.     Findings: No erythema or rash.  Neurological:     Mental Status: He is alert and oriented to person,  place, and time. Mental status is at baseline.     Cranial Nerves: No cranial nerve deficit.     Coordination: Coordination normal.  Psychiatric:        Mood and Affect: Mood normal.    LABORATORY DATA:  I have reviewed the data as listed Lab Results  Component Value Date   WBC 5.3 04/04/2021   HGB 11.6 (L) 04/04/2021   HCT 34.5 (L) 04/04/2021   MCV 86.0 04/04/2021   PLT 168 04/04/2021   Recent Labs    04/16/20 1525 08/25/20 1512 03/07/21 0842 03/21/21 0955 04/04/21 0824  NA  --    < > 136 138 138  K  --    < > 3.5 3.6 3.4*  CL  --    < > 102 101 104  CO2  --    < > $R'25 27 28  'QY$ GLUCOSE  --    < > 151* 139* 134*  BUN  --    < > $R'18 16 11  'tu$ CREATININE  --    < > 1.24 1.14 1.22  CALCIUM  --    < > 9.1 9.1 8.8*  GFRNONAA  --    < > >60 >60 >60  PROT 7.0   < > 7.6 7.8 7.4  ALBUMIN 4.3   < > 4.2 4.3 3.9  AST 17   < > 54* 26 24  ALT 19   < > $R'19 14 14  'Kv$ ALKPHOS 123*   < > 72 78 70  BILITOT 0.4   < > 0.5 0.5 0.4  BILIDIR 0.10  --   --   --   --    < > = values in this interval not displayed.    Iron/TIBC/Ferritin/ %Sat    Component Value Date/Time   IRON 48 08/30/2020 1052   TIBC 391 08/30/2020 1052   FERRITIN 25 08/30/2020 1052   IRONPCTSAT 12 (L) 08/30/2020 1052      RADIOGRAPHIC STUDIES: I have personally reviewed the radiological images as listed and agreed with the findings in the report. DG Chest 2 View  Result Date: 01/14/2021 CLINICAL DATA:   Tachycardia.  History of lung cancer. EXAM: CHEST - 2 VIEW COMPARISON:  09/14/2020 and CT of the chest on 12/25/2020 FINDINGS: Patient has RIGHT-sided PowerPort, tip to the superior vena cava. There is no pneumothorax. Cardiac mediastinal silhouette is normal. There is minimal focal patchy opacity in the RIGHT UPPER lobe, consistent with atelectasis or early infiltrate. No frank consolidations. No evidence for pulmonary edema. No pleural effusions. Remote cervical spine fusion. IMPRESSION: 1. RIGHT-sided power port. 2. RIGHT UPPER lobe infiltrate or atelectasis. Electronically Signed   By: Nolon Nations M.D.   On: 01/14/2021 11:16   CT Chest Wo Contrast  Result Date: 03/29/2021 CLINICAL DATA:  Lung cancer follow-up EXAM: CT CHEST WITHOUT CONTRAST TECHNIQUE: Multidetector CT imaging of the chest was performed following the standard protocol without IV contrast. COMPARISON:  Chest CT dated July 27, 2020 FINDINGS: Cardiovascular: Normal heart size. Trace pericardial fluid. Calcifications of the RCA and LAD. Right chest wall port with tip position in the upper right atrium minimal calcified plaque of the thoracic aorta. Mediastinum/Nodes: Unchanged size of mediastinal lymph nodes. Stable right upper paratracheal lymph node measuring 8 mm on series 2, image 39 stable right lower paratracheal lymph node measuring 1.0 cm on image 68. Similar soft tissue stranding of the right mediastinum and hilum, likely postradiation change. Esophagus and thyroid are unremarkable. Lungs/Pleura: Central airways are  patent. New linear right perihilar consolidation opacity with associated traction bronchiectasis and surrounding ground-glass.Additional peribronchovascular and nodular ground-glass opacities with areas of consolidation are seen primarily in the right upper and lower lobes which are more distant from the expected radiation field. Reference ground-glass opacity of the right lower lobe measuring up to 1.5 cm on series 3,  image 98. Upper Abdomen: No acute abnormality. Musculoskeletal: No chest wall mass or suspicious bone lesions identified. IMPRESSION: New linear right perihilar consolidation with associated traction bronchiectasis, likely postradiation change. Additional new peribronchovascular and nodular ground-glass opacities with areas of consolidation are seen primarily in the right upper and lower lobes which are more distant from the expected radiation field, findings are possibly due to radiation induced organizing pneumonia or superimposed drug toxicity. Recommend follow-up chest CT in 1-2 months to assess evolution. Unchanged size of mediastinal lymph nodes. Aortic Atherosclerosis (ICD10-I70.0) and Emphysema (ICD10-J43.9). Electronically Signed   By: Yetta Glassman M.D.   On: 03/29/2021 11:39   ABI WITH/WO TBI  Result Date: 01/28/2021  LOWER EXTREMITY DOPPLER STUDY Patient Name:  MARQUAVIOUS NAZAR  Date of Exam:   01/17/2021 Medical Rec #: [id number]            Accession #:    [id number] Date of Birth: 1955-10-17           Patient Gender: M Patient Age:   64Y Exam Location:  [facility] Procedure:      VAS [place] ABI WITH/WO TBI Referring Phys: [id number] [place] --------------------------------------------------------------------------------  Indications: Routine f/u. Other Factors: ASO s/p multiple interventions and bypass.                11/15/2019 Right thrombectomy of Fempop insitu graft with added                stent throughout.                 12/01/2019: Aortogram and Selctive Right Lower Extremity                Angiogram. 6 mg of TPA delivered in a lysis catheterfrom the                Origin of the Right Femoral -Popliteal bypass down the proximal                ATA with placement of the lysis.                 12/02/2019: PTA of the Right Posterior Tibial Artery. PTA of the                Right Peroneal Artery. Mechanical Thrombectomy of the Right                Femoral -Popliteal Bypass graft.  Mechanical Thrombectomy of the                Right Tibioperoneal Trunk and Posterior Tibial Artery.  Vascular Interventions: Hx of multiple right lower extremity PTA's/stents.                         09/10/2016 Right femoral to below the knee popliteal                         artery bypass graft placement. 5/01 Right bypass graft &  mid popliteal artery PTA & coil embolectomy of large                         branch of bypass graft. 12/09/16 Right distal bypass                         graft PTA/stent and right posterior tibial artery PTA.                         03/23/2020 Rt PTA thrombectomy. Rt Profunda fem artery to                         PTA bypass. Comparison Study: 10/08/2020 Performing Technologist: Charlane Ferretti RT (R)(VS)  Examination Guidelines: A complete evaluation includes at minimum, Doppler waveform signals and systolic blood pressure reading at the level of bilateral brachial, anterior tibial, and posterior tibial arteries, when vessel segments are accessible. Bilateral testing is considered an integral part of a complete examination. Photoelectric Plethysmograph (PPG) waveforms and toe systolic pressure readings are included as required and additional duplex testing as needed. Limited examinations for reoccurring indications may be performed as noted.  ABI Findings: +---------+------------------+-----+---------+--------+ Right    Rt Pressure (mmHg)IndexWaveform Comment  +---------+------------------+-----+---------+--------+ Brachial 141                                      +---------+------------------+-----+---------+--------+ ATA      145               1.03 triphasic         +---------+------------------+-----+---------+--------+ PTA      153               1.09 triphasic         +---------+------------------+-----+---------+--------+ Great Toe100               0.71 Dampened          +---------+------------------+-----+---------+--------+  +---------+------------------+-----+---------+-------+ Left     Lt Pressure (mmHg)IndexWaveform Comment +---------+------------------+-----+---------+-------+ Brachial 135                                     +---------+------------------+-----+---------+-------+ ATA      155               1.10 triphasic        +---------+------------------+-----+---------+-------+ PTA      143               1.01 triphasic        +---------+------------------+-----+---------+-------+ Great Toe98                0.70 Normal           +---------+------------------+-----+---------+-------+ +-------+-----------+-----------+------------+------------+ ABI/TBIToday's ABIToday's TBIPrevious ABIPrevious TBI +-------+-----------+-----------+------------+------------+ Right  1.09       .71        1.05        .73          +-------+-----------+-----------+------------+------------+ Left   1.10       .70        1.19        1.06         +-------+-----------+-----------+------------+------------+ Bilateral ABIs appear essentially unchanged compared to prior study on 10/08/2020. Bilateral TBIs appear essentially unchanged compared  to prior study on 10/08/2020.  Summary: Right: Resting right ankle-brachial index is within normal range. No evidence of significant right lower extremity arterial disease. The right toe-brachial index is normal. Left: Resting left ankle-brachial index is within normal range. No evidence of significant left lower extremity arterial disease. The left toe-brachial index is normal. *See table(s) above for measurements and observations.  Electronically signed by [name] MD on 01/28/2021 at 1:12:33 PM.    Final        ASSESSMENT & PLAN:  1. Squamous cell carcinoma of right lung ([place])   2. Encounter for antineoplastic immunotherapy   3. Hypothyroidism due to medication   Cancer Staging Squamous cell carcinoma of right lung [facility]) Staging form: Lung, AJCC 8th Edition -  Clinical stage from 09/20/2020: Stage IIIA (cT1b, cN2, cM0) - Signed by [name], MD on 09/20/2020   #Squamous cell carcinoma of right lung, stage IIIA, Currently on Durvalumab maintenance 03/28/2021, CT without contrast showed new linear right perihilar consolidation with associated traction bronchiectasis.  Likely postradiation changes.  Additional new peribronchial vascular/nodular groundglass opacities with areas of consolidation or seen primarily in the right upper and lower lobes, more distant from expected radiation fields.  Findings are possibly due to radiation-induced organizing pneumonia.  Or superimposed drug toxicity.  Recommend follow-up CT chest in 1 to 2 months for assessment of evaluation. I will hold immunotherapy. Discussed with Dr. Patsey Berthold who will see patient today. Communicated with Dr. Patsey Berthold after patient's visit.  Patient will get PFTs.  Recommend steroid inhaler given his mild symptoms.  We will repeat CT scan in 4 -6 weeks and if improving, will resume immunotherapy.   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  Recommend patient to i continue Synthroid to 50 MCG daily.   All questions were answered. The patient knows to call the clinic with any problems questions or concerns.  cc [name], MD   Return of visit: Lab MD possible durvalumab 05/16/2021   [name], MD, PhD Hematology Oncology [place] at [facility]  04/04/2021

## 2021-04-04 NOTE — Patient Instructions (Addendum)
You did well on your walking test today.  We are going to get breathing tests.  We are going to repeat a CT of the chest in 4 to 6 weeks.  We have provided you with a spacer to use your [place] with.  You need to use your Breztri 2 puffs twice a day make sure you rinse your mouth well after you use it.  The Judithann Sauger will help with the inflammation in your lungs.  We will see you in follow-up after CT scan of the chest is performed in 4 to 6 weeks time.

## 2021-04-18 ENCOUNTER — Ambulatory Visit: Payer: Medicare Other | Admitting: Oncology

## 2021-04-18 ENCOUNTER — Other Ambulatory Visit: Payer: Medicare Other

## 2021-04-18 ENCOUNTER — Ambulatory Visit: Payer: Medicare Other

## 2021-04-23 ENCOUNTER — Other Ambulatory Visit: Payer: Self-pay | Admitting: Oncology

## 2021-04-24 ENCOUNTER — Encounter: Payer: Self-pay | Admitting: Oncology

## 2021-04-24 ENCOUNTER — Other Ambulatory Visit
Admission: RE | Admit: 2021-04-24 | Discharge: 2021-04-24 | Disposition: A | Discharge status: Home / Self Care | Payer: Medicare Other | Source: Ambulatory Visit | Attending: Internal Medicine | Admitting: Internal Medicine

## 2021-04-24 DIAGNOSIS — I70321 Atherosclerosis of unspecified type of bypass graft(s) of the extremities with rest pain, right leg: Secondary | ICD-10-CM | POA: Diagnosis not present

## 2021-04-24 DIAGNOSIS — I739 Peripheral vascular disease, unspecified: Secondary | ICD-10-CM | POA: Insufficient documentation

## 2021-04-24 DIAGNOSIS — I10 Essential (primary) hypertension: Secondary | ICD-10-CM | POA: Diagnosis not present

## 2021-04-24 DIAGNOSIS — Z79899 Other long term (current) drug therapy: Secondary | ICD-10-CM | POA: Insufficient documentation

## 2021-04-24 DIAGNOSIS — Z01818 Encounter for other preprocedural examination: Secondary | ICD-10-CM | POA: Diagnosis present

## 2021-04-24 DIAGNOSIS — R0602 Shortness of breath: Secondary | ICD-10-CM | POA: Diagnosis not present

## 2021-04-24 DIAGNOSIS — Z0000 Encounter for general adult medical examination without abnormal findings: Secondary | ICD-10-CM | POA: Insufficient documentation

## 2021-04-24 LAB — BRAIN NATRIURETIC PEPTIDE: B Natriuretic Peptide: 52.7 pg/mL (ref 0.0–100.0)

## 2021-05-01 ENCOUNTER — Other Ambulatory Visit: Payer: Self-pay | Admitting: Oncology

## 2021-05-09 ENCOUNTER — Ambulatory Visit
Admission: RE | Admit: 2021-05-09 | Discharge: 2021-05-09 | Disposition: A | Discharge status: Home / Self Care | Payer: Medicare Other | Source: Ambulatory Visit | Attending: Pulmonary Disease | Admitting: Pulmonary Disease

## 2021-05-09 ENCOUNTER — Other Ambulatory Visit: Payer: Self-pay

## 2021-05-09 DIAGNOSIS — C3491 Malignant neoplasm of unspecified part of right bronchus or lung: Secondary | ICD-10-CM | POA: Insufficient documentation

## 2021-05-14 ENCOUNTER — Encounter: Payer: Self-pay | Admitting: Pulmonary Disease

## 2021-05-16 ENCOUNTER — Other Ambulatory Visit: Payer: Self-pay

## 2021-05-16 ENCOUNTER — Encounter
Admission: RE | Disposition: A | Discharge status: Home / Self Care | Payer: Self-pay | Source: Home / Self Care | Attending: Internal Medicine

## 2021-05-16 ENCOUNTER — Ambulatory Visit: Payer: Medicare Other

## 2021-05-16 ENCOUNTER — Ambulatory Visit
Admission: RE | Admit: 2021-05-16 | Discharge: 2021-05-16 | Disposition: A | Discharge status: Home / Self Care | Payer: Medicare Other | Attending: Internal Medicine | Admitting: Internal Medicine

## 2021-05-16 ENCOUNTER — Ambulatory Visit: Payer: Medicare Other | Admitting: Radiation Oncology

## 2021-05-16 ENCOUNTER — Other Ambulatory Visit: Payer: Medicare Other

## 2021-05-16 ENCOUNTER — Encounter: Payer: Self-pay | Admitting: Internal Medicine

## 2021-05-16 ENCOUNTER — Ambulatory Visit: Payer: Medicare Other | Admitting: Oncology

## 2021-05-16 DIAGNOSIS — G4730 Sleep apnea, unspecified: Secondary | ICD-10-CM | POA: Insufficient documentation

## 2021-05-16 DIAGNOSIS — Z87891 Personal history of nicotine dependence: Secondary | ICD-10-CM | POA: Diagnosis not present

## 2021-05-16 DIAGNOSIS — I10 Essential (primary) hypertension: Secondary | ICD-10-CM | POA: Diagnosis not present

## 2021-05-16 DIAGNOSIS — Z79890 Hormone replacement therapy (postmenopausal): Secondary | ICD-10-CM | POA: Insufficient documentation

## 2021-05-16 DIAGNOSIS — I200 Unstable angina: Secondary | ICD-10-CM | POA: Insufficient documentation

## 2021-05-16 DIAGNOSIS — Z7902 Long term (current) use of antithrombotics/antiplatelets: Secondary | ICD-10-CM | POA: Insufficient documentation

## 2021-05-16 DIAGNOSIS — I739 Peripheral vascular disease, unspecified: Secondary | ICD-10-CM | POA: Diagnosis not present

## 2021-05-16 DIAGNOSIS — Z79899 Other long term (current) drug therapy: Secondary | ICD-10-CM | POA: Insufficient documentation

## 2021-05-16 DIAGNOSIS — Z7901 Long term (current) use of anticoagulants: Secondary | ICD-10-CM | POA: Insufficient documentation

## 2021-05-16 DIAGNOSIS — C3490 Malignant neoplasm of unspecified part of unspecified bronchus or lung: Secondary | ICD-10-CM | POA: Diagnosis not present

## 2021-05-16 HISTORY — PX: LEFT HEART CATH AND CORONARY ANGIOGRAPHY: CATH118249

## 2021-05-16 SURGERY — LEFT HEART CATH AND CORONARY ANGIOGRAPHY
Anesthesia: Moderate Sedation | Laterality: Left

## 2021-05-16 MED ORDER — SODIUM CHLORIDE 0.9 % IV SOLN: 250.0000 mL | INTRAVENOUS | Status: DC | PRN

## 2021-05-16 MED ORDER — VERAPAMIL HCL 2.5 MG/ML IV SOLN
INTRAVENOUS | Status: AC
  Filled 2021-05-16: qty 2

## 2021-05-16 MED ORDER — SODIUM CHLORIDE 0.9% FLUSH: 3.0000 mL | INTRAVENOUS | Status: DC | PRN

## 2021-05-16 MED ORDER — SODIUM CHLORIDE 0.9 % IV SOLN: INTRAVENOUS | Status: DC

## 2021-05-16 MED ORDER — FENTANYL CITRATE (PF) 100 MCG/2ML IJ SOLN
INTRAMUSCULAR | Status: AC
  Filled 2021-05-16: qty 2

## 2021-05-16 MED ORDER — SODIUM CHLORIDE 0.9% FLUSH: 3.0000 mL | Freq: Two times a day (BID) | INTRAVENOUS | Status: DC

## 2021-05-16 MED ORDER — ASPIRIN 81 MG PO CHEW: 81.0000 mg | CHEWABLE_TABLET | ORAL | Status: DC

## 2021-05-16 MED ORDER — LIDOCAINE HCL (PF) 1 % IJ SOLN
INTRAMUSCULAR | Status: DC | PRN
  Administered 2021-05-16: 2 mL

## 2021-05-16 MED ORDER — VERAPAMIL HCL 2.5 MG/ML IV SOLN
INTRAVENOUS | Status: DC | PRN
  Administered 2021-05-16: 2.5 mg via INTRA_ARTERIAL

## 2021-05-16 MED ORDER — MIDAZOLAM HCL 2 MG/2ML IJ SOLN
INTRAMUSCULAR | Status: DC | PRN
  Administered 2021-05-16: 1 mg via INTRAVENOUS

## 2021-05-16 MED ORDER — SODIUM CHLORIDE 0.9 % WEIGHT BASED INFUSION
3.0000 mL/kg/h | INTRAVENOUS | Status: AC
  Administered 2021-05-16: 3 mL/kg/h via INTRAVENOUS

## 2021-05-16 MED ORDER — MIDAZOLAM HCL 2 MG/2ML IJ SOLN
INTRAMUSCULAR | Status: AC
  Filled 2021-05-16: qty 2

## 2021-05-16 MED ORDER — HEPARIN (PORCINE) IN NACL 1000-0.9 UT/500ML-% IV SOLN
INTRAVENOUS | Status: DC | PRN
  Administered 2021-05-16: 1000 mL

## 2021-05-16 MED ORDER — IOHEXOL 350 MG/ML SOLN
INTRAVENOUS | Status: DC | PRN
  Administered 2021-05-16: 38 mL via INTRA_ARTERIAL

## 2021-05-16 MED ORDER — SODIUM CHLORIDE 0.9 % WEIGHT BASED INFUSION: 1.0000 mL/kg/h | INTRAVENOUS | Status: DC

## 2021-05-16 MED ORDER — ONDANSETRON HCL 4 MG/2ML IJ SOLN: 4.0000 mg | Freq: Four times a day (QID) | INTRAMUSCULAR | Status: DC | PRN

## 2021-05-16 MED ORDER — ACETAMINOPHEN 325 MG PO TABS: 650.0000 mg | ORAL_TABLET | ORAL | Status: DC | PRN

## 2021-05-16 MED ORDER — FENTANYL CITRATE (PF) 100 MCG/2ML IJ SOLN
INTRAMUSCULAR | Status: DC | PRN
  Administered 2021-05-16: 25 ug via INTRAVENOUS

## 2021-05-16 MED ORDER — LIDOCAINE HCL 1 % IJ SOLN
INTRAMUSCULAR | Status: AC
  Filled 2021-05-16: qty 20

## 2021-05-16 MED ORDER — HEPARIN SOD (PORK) LOCK FLUSH 100 UNIT/ML IV SOLN
INTRAVENOUS | Status: AC
  Filled 2021-05-16: qty 5

## 2021-05-16 MED ORDER — HEPARIN SODIUM (PORCINE) 1000 UNIT/ML IJ SOLN
INTRAMUSCULAR | Status: AC
  Filled 2021-05-16: qty 1

## 2021-05-16 MED ORDER — HEPARIN (PORCINE) IN NACL 1000-0.9 UT/500ML-% IV SOLN
INTRAVENOUS | Status: AC
  Filled 2021-05-16: qty 1000

## 2021-05-16 MED ORDER — HEPARIN SODIUM (PORCINE) 1000 UNIT/ML IJ SOLN
INTRAMUSCULAR | Status: DC | PRN
  Administered 2021-05-16: 4500 [IU] via INTRAVENOUS

## 2021-05-16 SURGICAL SUPPLY — 9 items
CATH INFINITI 5 FR JL3.5 (CATHETERS) ×2 IMPLANT
CATH INFINITI JR4 5F (CATHETERS) ×2 IMPLANT
DEVICE RAD TR BAND REGULAR (VASCULAR PRODUCTS) ×2 IMPLANT
DRAPE BRACHIAL (DRAPES) ×2 IMPLANT
GLIDESHEATH SLEND SS 6F .021 (SHEATH) ×2 IMPLANT
PACK CARDIAC CATH (CUSTOM PROCEDURE TRAY) ×2 IMPLANT
PROTECTION STATION PRESSURIZED (MISCELLANEOUS) ×2
STATION PROTECTION PRESSURIZED (MISCELLANEOUS) ×1 IMPLANT
WIRE HI TORQ VERSACORE J 260CM (WIRE) ×2 IMPLANT

## 2021-05-16 NOTE — H&P (Signed)
[facility] Cardiology History and Physical  Patient ID: Joshua [name] MRN: [id number] DOB/AGE: 63-Sep-1957 63 y.o. Admit date: 05/16/2021  Primary Care Physician: [name], MD Primary Cardiologist [facility]  HPI:  Joshua [name] is a 65 year old male with history of hypertension, PAD (right femoropopliteal bypass several other prior interventions), hypertension, smoking, lung cancer currently undergoing immunotherapy who was evaluated by Dr. Clayborn Bigness for evaluation of palpitations, and intermittent chest pain.  He had an echocardiogram which was essentially normal, and a nuclear medicine stress test which showed no ischemia.  He has ongoing symptoms and is thus referred for cardiac cath.   Past Medical History:  Diagnosis Date   Allergy    Cancer ([place])    lung   Dyspnea    former smoker. only doe   History of kidney stones    Hypertension    Peripheral vascular disease ([place])    Pre-diabetes    Sleep apnea     Past Surgical History:  Procedure Laterality Date   ANTERIOR CERVICAL DECOMP/DISCECTOMY FUSION N/A 03/14/2020   Procedure: ANTERIOR CERVICAL DECOMPRESSION/DISCECTOMY FUSION 3 LEVELS C4-7;  Surgeon: Meade Maw, MD;  Location: [facility] ORS;  Service: Neurosurgery;  Laterality: N/A;   BACK SURGERY  2011   CENTRAL LINE INSERTION Right 09/10/2016   Procedure: CENTRAL LINE INSERTION;  Surgeon: Katha Cabal, MD;  Location: [facility];  Service: Vascular;  Laterality: Right;   CERVICAL FUSION     C4-c7 on Mar 14, 2020   COLONOSCOPY  2013 ?   CORONARY ANGIOPLASTY     FEMORAL-POPLITEAL BYPASS GRAFT Right 09/10/2016   Procedure: BYPASS GRAFT FEMORAL-POPLITEAL ARTERY ( BELOW KNEE );  Surgeon: Katha Cabal, MD;  Location: [facility];  Service: Vascular;  Laterality: Right;   FEMORAL-TIBIAL BYPASS GRAFT Right 03/23/2020   Procedure: BYPASS GRAFT RIGHT FEMORAL- DISTAL TIBIAL ARTERY WITH DISTAL FLOW GRAFT;  Surgeon: Katha Cabal, MD;  Location: [facility];   Service: Vascular;  Laterality: Right;   LOWER EXTREMITY ANGIOGRAPHY Right 11/18/2016   Procedure: Lower Extremity Angiography;  Surgeon: Katha Cabal, MD;  Location: [place] [facility];  Service: Cardiovascular;  Laterality: Right;   LOWER EXTREMITY ANGIOGRAPHY Right 12/09/2016   Procedure: Lower Extremity Angiography;  Surgeon: Katha Cabal, MD;  Location: [place] [facility];  Service: Cardiovascular;  Laterality: Right;   LOWER EXTREMITY ANGIOGRAPHY Right 11/15/2019   Procedure: LOWER EXTREMITY ANGIOGRAPHY;  Surgeon: Katha Cabal, MD;  Location: [place] [facility];  Service: Cardiovascular;  Laterality: Right;   LOWER EXTREMITY ANGIOGRAPHY Right 12/01/2019   Procedure: Lower Extremity Angiography;  Surgeon: Algernon Huxley, MD;  Location: [place] [facility];  Service: Cardiovascular;  Laterality: Right;   LOWER EXTREMITY ANGIOGRAPHY Right 12/02/2019   Procedure: LOWER EXTREMITY ANGIOGRAPHY;  Surgeon: Katha Cabal, MD;  Location: [place] [facility];  Service: Cardiovascular;  Laterality: Right;   LOWER EXTREMITY ANGIOGRAPHY Right 03/23/2020   Procedure: Lower Extremity Angiography;  Surgeon: Katha Cabal, MD;  Location: [place] [facility];  Service: Cardiovascular;  Laterality: Right;   LOWER EXTREMITY INTERVENTION  11/18/2016   Procedure: Lower Extremity Intervention;  Surgeon: Katha Cabal, MD;  Location: [place] [facility];  Service: Cardiovascular;;   PERIPHERAL VASCULAR CATHETERIZATION Right 01/02/2015   Procedure: Lower Extremity Angiography;  Surgeon: Katha Cabal, MD;  Location: [place] [facility];  Service: Cardiovascular;  Laterality: Right;   PERIPHERAL VASCULAR CATHETERIZATION Right 06/18/2015   Procedure: Lower Extremity Angiography;  Surgeon: Algernon Huxley,  MD;  Location: [place] [facility];  Service: Cardiovascular;  Laterality: Right;   PERIPHERAL VASCULAR CATHETERIZATION  06/18/2015   Procedure: Lower Extremity Intervention;  Surgeon:  Algernon Huxley, MD;  Location: [place] [facility];  Service: Cardiovascular;;   PERIPHERAL VASCULAR CATHETERIZATION N/A 10/09/2015   Procedure: Abdominal Aortogram w/Lower Extremity;  Surgeon: Katha Cabal, MD;  Location: [place] [facility];  Service: Cardiovascular;  Laterality: N/A;   PERIPHERAL VASCULAR CATHETERIZATION  10/09/2015   Procedure: Lower Extremity Intervention;  Surgeon: Katha Cabal, MD;  Location: [place] [facility];  Service: Cardiovascular;;   PERIPHERAL VASCULAR CATHETERIZATION Right 10/10/2015   Procedure: Lower Extremity Angiography;  Surgeon: Algernon Huxley, MD;  Location: [place] [facility];  Service: Cardiovascular;  Laterality: Right;   PERIPHERAL VASCULAR CATHETERIZATION Right 10/10/2015   Procedure: Lower Extremity Intervention;  Surgeon: Algernon Huxley, MD;  Location: [place] [facility];  Service: Cardiovascular;  Laterality: Right;   PERIPHERAL VASCULAR CATHETERIZATION Right 12/03/2015   Procedure: Lower Extremity Angiography;  Surgeon: Algernon Huxley, MD;  Location: [place] [facility];  Service: Cardiovascular;  Laterality: Right;   PERIPHERAL VASCULAR CATHETERIZATION Right 12/04/2015   Procedure: Lower Extremity Angiography;  Surgeon: Algernon Huxley, MD;  Location: [place] [facility];  Service: Cardiovascular;  Laterality: Right;   PERIPHERAL VASCULAR CATHETERIZATION  12/04/2015   Procedure: Lower Extremity Intervention;  Surgeon: Algernon Huxley, MD;  Location: [place] [facility];  Service: Cardiovascular;;   PERIPHERAL VASCULAR CATHETERIZATION Right 06/30/2016   Procedure: Lower Extremity Angiography;  Surgeon: Algernon Huxley, MD;  Location: [place] [facility];  Service: Cardiovascular;  Laterality: Right;   PERIPHERAL VASCULAR CATHETERIZATION Right 06/25/2016   Procedure: Lower Extremity Angiography;  Surgeon: Katha Cabal, MD;  Location: [place] [facility];  Service: Cardiovascular;  Laterality: Right;   PERIPHERAL VASCULAR CATHETERIZATION Right 07/01/2016    Procedure: Lower Extremity Angiography;  Surgeon: Katha Cabal, MD;  Location: [place] [facility];  Service: Cardiovascular;  Laterality: Right;   PERIPHERAL VASCULAR CATHETERIZATION  07/01/2016   Procedure: Lower Extremity Intervention;  Surgeon: Katha Cabal, MD;  Location: [place] [facility];  Service: Cardiovascular;;   PERIPHERAL VASCULAR CATHETERIZATION Right 08/01/2016   Procedure: Lower Extremity Angiography;  Surgeon: Katha Cabal, MD;  Location: [place] [facility];  Service: Cardiovascular;  Laterality: Right;   PORTA CATH INSERTION N/A 10/05/2020   Procedure: PORTA CATH INSERTION;  Surgeon: Katha Cabal, MD;  Location: [place] [facility];  Service: Cardiovascular;  Laterality: N/A;   stent placement in right leg Right    VIDEO BRONCHOSCOPY N/A 09/14/2020   Procedure: VIDEO BRONCHOSCOPY WITH FLUORO;  Surgeon: Tyler Pita, MD;  Location: [facility];  Service: Cardiopulmonary;  Laterality: N/A;   VIDEO BRONCHOSCOPY WITH ENDOBRONCHIAL ULTRASOUND N/A 09/14/2020   Procedure: VIDEO BRONCHOSCOPY WITH ENDOBRONCHIAL ULTRASOUND;  Surgeon: Tyler Pita, MD;  Location: [facility];  Service: Cardiopulmonary;  Laterality: N/A;    Medications Prior to Admission  Medication Sig Dispense Refill Last Dose   acetaminophen (TYLENOL) 325 MG tablet Take 650 mg by mouth every 6 (six) hours as needed for moderate pain.      clopidogrel (PLAVIX) 75 MG tablet TAKE 1 TABLET BY MOUTH  DAILY 90 tablet 3 05/15/2021   ELIQUIS 5 MG TABS tablet TAKE 1 TABLET BY MOUTH  TWICE DAILY 180 tablet 3 05/12/2021   gabapentin (NEURONTIN) 300 MG capsule Take 2 capsules (600 mg total) by mouth 3 (  three) times daily. (Patient taking differently: Take 300-900 mg by mouth at bedtime as needed (pain).) 540 capsule 1 Past Week   levothyroxine (SYNTHROID) 50 MCG tablet TAKE 1 TABLET BY MOUTH ONCE DAILY BEFORE BREAKFAST 30 tablet 0 05/15/2021   lidocaine-prilocaine (EMLA) cream Apply to affected area  once (Patient taking differently: Apply 1 application topically daily as needed (port access).) 30 g 3    neomycin-polymyxin-hydrocortisone (CORTISPORIN) 3.5-10000-1 ophthalmic suspension Place 1 drop into both eyes daily as needed (dry eyes).   05/16/2021   simvastatin (ZOCOR) 40 MG tablet Take 40 mg by mouth at bedtime.   05/15/2021   Budeson-Glycopyrrol-Formoterol (BREZTRI AEROSPHERE) 160-9-4.8 MCG/ACT AERO Inhale 2 puffs into the lungs in the morning and at bedtime. (Patient not taking: Reported on 05/09/2021) 10.7 g 0 Not Taking   Social History   Socioeconomic History   Marital status: Single    Spouse name: Not on file   Number of children: Not on file   Years of education: Not on file   Highest education level: Not on file  Occupational History   Occupation: unemployed  Tobacco Use   Smoking status: Former    Packs/day: 2.00    Years: 38.00    Pack years: 76.00    Types: Cigarettes    Quit date: 12/20/2014    Years since quitting: 6.4   Smokeless tobacco: Former   Tobacco comments:    quit smoking in 2017 most smoked 2   Vaping Use   Vaping Use: Never used  Substance and Sexual Activity   Alcohol use: Not Currently   Drug use: No   Sexual activity: Yes  Other Topics Concern   Not on file  Social History Narrative   Not on file   Social Determinants of Health   Financial Resource Strain: Not on file  Food Insecurity: Not on file  Transportation Needs: Not on file  Physical Activity: Not on file  Stress: Not on file  Social Connections: Not on file  Intimate Partner Violence: Not on file    Family History  Problem Relation Age of Onset   Diabetes Mother    Hypertension Mother    Heart murmur Mother    Leukemia Mother    Throat cancer Maternal Grandmother    Colon cancer Neg Hx    Breast cancer Neg Hx       Review of systems complete and found to be negative unless listed above    Physical Exam:  General: Well developed, well nourished, in no acute  distress HEENT:  Normocephalic and atramatic Neck:  No JVD.  Lungs: Clear bilaterally to auscultation and percussion. Heart: HRRR . Normal S1 and S2 without gallops or murmurs.  Abdomen: Bowel sounds are positive, abdomen soft and non-tender  Msk:  Back normal, normal gait. Normal strength and tone for age. Extremities: No clubbing, cyanosis or edema.   Neuro: Alert and oriented X 3. Psych:  Good affect, responds appropriately   Labs:   Lab Results  Component Value Date   WBC 5.3 04/04/2021   HGB 11.6 (L) 04/04/2021   HCT 34.5 (L) 04/04/2021   MCV 86.0 04/04/2021   PLT 168 04/04/2021   No results for input(s): NA, K, CL, CO2, BUN, CREATININE, CALCIUM, PROT, BILITOT, ALKPHOS, ALT, AST, GLUCOSE in the last 168 hours.  Invalid input(s): LABALBU No results found for: CKTOTAL, CKMB, CKMBINDEX, TROPONINI  Lab Results  Component Value Date   CHOL 130 11/21/2020   CHOL 155 04/16/2020   CHOL  153 04/13/2019   Lab Results  Component Value Date   HDL 45 11/21/2020   HDL 46 04/16/2020   HDL 46 04/13/2019   Lab Results  Component Value Date   LDLCALC 64 11/21/2020   LDLCALC 89 04/16/2020   LDLCALC 94 04/13/2019   Lab Results  Component Value Date   TRIG 106 11/21/2020   TRIG 110 04/16/2020   TRIG 67 04/13/2019   Lab Results  Component Value Date   CHOLHDL 2.9 11/21/2020   CHOLHDL 3.4 04/16/2020   CHOLHDL 3.3 04/13/2019   No results found for: LDLDIRECT    Radiology: CT CHEST WO CONTRAST  Result Date: 05/10/2021 CLINICAL DATA:  Non-small cell lung cancer for routine follow-up. 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 EXAM: CT CHEST WITHOUT CONTRAST TECHNIQUE: Multidetector CT imaging of the chest was performed following the standard protocol without IV contrast. COMPARISON:  CT chest 03/28/2021 and 12/25/2020.  PET-CT 09/18/2020. FINDINGS: Cardiovascular: Right IJ Port-A-Cath extends to the superior cavoatrial junction. Mild  coronary artery atherosclerosis. No significant vascular findings on noncontrast imaging. The heart size is normal. There is no pericardial effusion. Mediastinum/Nodes: There are no enlarged mediastinal, hilar or axillary lymph nodes.Stable 7 mm right paratracheal node on image 61/2. There is new thickening of the posterior wall of the right upper lobe bronchus, measuring up to 1.2 cm on image 62/2. Right hilar store shin and peribronchial opacities are otherwise similar, attributed to prior radiation therapy. The thyroid gland, trachea and esophagus demonstrate no significant findings. Lungs/Pleura: There is no pleural effusion or pneumothorax. Evolving radiation changes in the right perihilar region with some interval contraction of the overall process. Patchy ground-glass opacities more peripherally in the right lung have generally improved in the interval, with the exception of a single right upper lobe component measuring 10 mm on image 45/3 which appears new. There are no new or enlarging solid pulmonary nodules. The left lung appears clear. Upper abdomen: The visualized upper abdomen appears stable without significant findings. Musculoskeletal/Chest wall: There is no chest wall mass or suspicious osseous finding. Status post lower cervical fusion. IMPRESSION: 1. Evolving radiation changes in the right perihilar region, likely accounting for interval increased thickening of the posterior wall of the right upper lobe bronchus. 2. The more peripheral right lung ground-glass opacities seen on the most recent study have generally improved with exception of one peripherally in the right upper lobe which appears new. Again, these are likely inflammatory, potentially drug reaction. Continued follow-up recommended. 3. No findings highly suspicious for local recurrence or metastatic disease. Electronically Signed   By: Richardean Sale M.D.   On: 05/10/2021 21:52    EKG: NSR, incomplete right bundle branch  block.  ASSESSMENT AND PLAN:  65 year old male with prior significant vascular history, hypertension, smoking, lung cancer who was been experiencing palpitations and chest discomfort.  He has had a normal nuclear medicine stress test and normal echocardiogram but has ongoing symptoms and is thus referred for cardiac catheterization for further evaluation.  The risks and benefits were discussed at length with the patient he is agreeable to proceed.  Signed: Andrez Grime MD 05/16/2021, 1:16 PM

## 2021-05-16 NOTE — Discharge Instructions (Signed)
Radial Site Care Refer to this sheet in the next few weeks. These instructions provide you with information about caring for yourself after your procedure. Your health care provider may also give you more specific instructions. Your treatment has been planned according to current medical practices, but problems sometimes occur. Call your health care provider if you have any problems or questions after your procedure. What can I expect after the procedure? After your procedure, it is typical to have the following: Bruising at the radial site that usually fades within 1-2 weeks. Blood collecting in the tissue (hematoma) that may be painful to the touch. It should usually decrease in size and tenderness within 1-2 weeks.  Follow these instructions at home: Take medicines only as directed by your health care provider. If you are on a medication called Metformin please do not take for 48 hours after your procedure. Over the next 48hrs please increase your fluid intake of water and non caffeine beverages to flush the contrast dye out of your system.  You may shower 24 hours after the procedure  Leave your bandage on and gently wash the site with plain soap and water. Pat the area dry with a clean towel. Do not rub the site, because this may cause bleeding.  Remove your dressing 48hrs after your procedure and leave open to air.  Do not submerge your site in water for 7 days. This includes swimming and washing dishes.  Check your insertion site every day for redness, swelling, or drainage. Do not apply powder or lotion to the site. Do not flex or bend the affected arm for 24 hours or as directed by your health care provider. Do not push or pull heavy objects with the affected arm for 24 hours or as directed by your health care provider. Do not lift over 10 lb (4.5 kg) for 5 days after your procedure or as directed by your health care provider. Ask your health care provider when it is okay to: Return to  work or school. Resume usual physical activities or sports. Resume sexual activity. Do not drive home if you are discharged the same day as the procedure. Have someone else drive you. You may drive 48 hours after the procedure Do not operate machinery or power tools for 24 hours after the procedure. If your procedure was done as an outpatient procedure, which means that you went home the same day as your procedure, a responsible adult should be with you for the first 24 hours after you arrive home. Keep all follow-up visits as directed by your health care provider. This is important. Contact a health care provider if: You have a fever. You have chills. You have increased bleeding from the radial site. Hold pressure on the site. Get help right away if: You have unusual pain at the radial site. You have redness, warmth, or swelling at the radial site. You have drainage (other than a small amount of blood on the dressing) from the radial site. The radial site is bleeding, and the bleeding does not stop after 15 minutes of holding steady pressure on the site. Your arm or hand becomes pale, cool, tingly, or numb. This information is not intended to replace advice given to you by your health care provider. Make sure you discuss any questions you have with your health care provider. Document Released: 08/09/2010 Document Revised: 12/13/2015 Document Reviewed: 01/23/2014 Elsevier Interactive Patient Education  2018 [employer].

## 2021-05-17 ENCOUNTER — Encounter: Payer: Self-pay | Admitting: Cardiology

## 2021-05-22 ENCOUNTER — Other Ambulatory Visit: Payer: Self-pay

## 2021-05-22 ENCOUNTER — Ambulatory Visit (INDEPENDENT_AMBULATORY_CARE_PROVIDER_SITE_OTHER): Payer: Medicare Other | Admitting: Pulmonary Disease

## 2021-05-22 ENCOUNTER — Encounter: Payer: Self-pay | Admitting: Pulmonary Disease

## 2021-05-22 VITALS — BP 126/60 | HR 87 | Temp 97.5°F | Ht 71.0 in | Wt 201.2 lb

## 2021-05-22 DIAGNOSIS — J700 Acute pulmonary manifestations due to radiation: Secondary | ICD-10-CM

## 2021-05-22 DIAGNOSIS — C3491 Malignant neoplasm of unspecified part of right bronchus or lung: Secondary | ICD-10-CM | POA: Diagnosis not present

## 2021-05-22 DIAGNOSIS — J449 Chronic obstructive pulmonary disease, unspecified: Secondary | ICD-10-CM

## 2021-05-22 DIAGNOSIS — Z87891 Personal history of nicotine dependence: Secondary | ICD-10-CM

## 2021-05-22 MED ORDER — TRELEGY ELLIPTA 100-62.5-25 MCG/ACT IN AEPB: 1.0000 | INHALATION_SPRAY | Freq: Every day | RESPIRATORY_TRACT | 0 refills | Status: DC

## 2021-05-22 NOTE — Patient Instructions (Addendum)
We are stopping the Breztri and giving you a trial of TRELEGY ELLIPTA, this is 1 puff daily.  You have enough samples for approximately 28 days.  Let us know if you are doing well on this medication and we will send the prescription to your pharmacy.  DO NOT TAKE THE BREZTRI WHILE ON TRELEGY.  We are going to get breathing tests.  We will see you in follow-up in 2 months time.  Call sooner should any new problems arise.

## 2021-05-22 NOTE — Progress Notes (Signed)
Subjective:    Patient ID: Joshua [name], male    DOB: 24-Apr-1956, 65 y.o.   MRN: [id number] Chief Complaint  Patient presents with   Follow-up    Sob with exertion and occ dry cough    HPI Patient is a 65 year old former smoker who presents for follow-up of abnormal CT chest after radiation therapy.  His initial visit for this issue was on 04 April 2021.  Recall that we follow the patient for COPD severity yet to be determined.  His work-up has been delayed due to diagnosis of squamous cell carcinoma of the right upper lobe for which he underwent bronchoscopy with tumor debridement, biopsy and balloon dilation on 14 August 2020.  He has been followed up by medical and radiation oncology.  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  He received 4 cycles of carboplatin and Taxol and additional chemotherapy was held due to thrombocytopenia and neutropenia.  He received concurrent radiation starting on 23rd March which he completed on 10 May.Total radiation dose (Gy): 70.  Following that he was started on durvalumab as immunotherapy.  He had a chest CT on 26 December 2020 showed interval response to therapy with right hilar mass/adenopathy decreased in size and also decrease in size of the right paratracheal lymph node.  On 29 March 2021 he underwent follow-up CT scan of the chest showed right perihilar consolidation with traction bronchiectasis representing postradiation change however there were some peribronchial nodular groundglass opacities and consolidation seen primarily in the right upper and lower lobes in the expected radiation field likely representing radiation pneumonitis.  Findings were noted to be on the radiation field.  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  Patient subsequently had follow-up CT on 09 May 2021 that has shown resolution of these findings he has the expected scarring post radiation therapy.  Patient has  been cleared to resume ICI.  He presents today for follow-up and for discussion of those findings.  I have reassured him that he can resume durvalumab.  With regards to his COPD, we will need PFTs.  This was discussed with patient.  He is not tolerating Breztri well due to not tolerating the taste and has throat irritation.  It is also difficult for him to use the inhal.  He does not endorse any other symptomatology.  No fevers, chills or sweats.   Review of Systems A 10 point review of systems was performed and it is as noted above otherwise negative.  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   Encounter for antineoplastic immunotherapy 04/04/2021   Cancer ([place]) 01/14/2021   Antineoplastic chemotherapy induced anemia 10/17/2020   Squamous cell carcinoma of right lung ([place]) 09/20/2020   Goals of care, counseling/discussion 08/30/2020   Iron deficiency anemia 08/30/2020   Lung mass 08/30/2020   Ischemia of extremity 03/23/2020   Cervical myelopathy ([place]) 03/14/2020   Ischemia of right lower extremity 11/15/2019   Peripheral neuropathy 10/14/2019   Chronic left-sided low back pain without sciatica 04/13/2019   History of back surgery 04/13/2019   Overweight 04/13/2019   Atherosclerosis of lower extremity with claudication ([place]) 24/46/2863   Complication associated with vascular device 11/13/2016   Hyperlipidemia 06/18/2016   Hypertension 06/18/2016   Radiculopathy of lumbar region 06/10/2016   PVD (peripheral vascular disease) ([place]) 05/08/2016   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/03/2015   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   Budeson-Glycopyrrol-Formoterol (BREZTRI AEROSPHERE) 160-9-4.8 MCG/ACT AERO Inhale 2 puffs into the lungs in the morning and at bedtime.   clopidogrel (PLAVIX) 75 MG tablet TAKE 1 TABLET BY MOUTH  DAILY   ELIQUIS 5 MG TABS tablet TAKE 1 TABLET BY MOUTH  TWICE DAILY   gabapentin (NEURONTIN) 300 MG capsule Take 2 capsules (600 mg total) by mouth 3 (three) times daily. (Patient taking differently: Take 300-900 mg by mouth at bedtime as needed (pain).)   levothyroxine (SYNTHROID) 50 MCG tablet TAKE 1 TABLET BY MOUTH ONCE DAILY BEFORE BREAKFAST   lidocaine-prilocaine (EMLA) cream Apply to affected area once (Patient taking differently: Apply 1 application topically daily as needed (port access).)   neomycin-polymyxin-hydrocortisone (CORTISPORIN) 3.5-10000-1 ophthalmic suspension Place 1 drop into both eyes daily as needed (dry eyes).   simvastatin (ZOCOR) 40 MG tablet Take 40 mg by mouth at bedtime.   Immunization History  Administered Date(s) Administered   Influenza,inj,Quad PF,6+ Mos 09/14/2016, 04/13/2019, 04/16/2020   PFIZER(Purple Top)SARS-COV-2 Vaccination 10/20/2019, 11/29/2019   Tdap 04/16/2020       Objective:   Physical Exam BP 126/60 (BP Location: Left Arm, Cuff Size: Normal)   Pulse 87   Temp (!) 97.5 F (36.4 C) (Temporal)   Ht $R'5\' 11"'Go$  (1.803 m)   Wt 201 lb 3.2 oz (91.3 kg)   SpO2 100%   BMI 28.06 kg/m  GENERAL: Chronically ill-appearing well-developed well-nourished gentleman, no acute distress. Walks with assistance of a cane. HEAD: Normocephalic, atraumatic.  EYES: Pupils equal, round,  reactive to light.  No scleral icterus.  MOUTH: Nose/mouth/throat not examined due to masking requirements for COVID 19. NECK: Supple.  Limited ROM due to prior spinal fusion.  No thyromegaly. Trachea midline. No JVD.  No adenopathy. PULMONARY: Good air entry bilaterally.  Few rhonchi, no wheezes. CARDIOVASCULAR: S1 and S2. Regular rate and rhythm.  No rubs, murmurs or gallops heard. ABDOMEN: Benign. MUSCULOSKELETAL: No joint deformity, no clubbing, no edema.  NEUROLOGIC: No overt focal deficit, walks with assistance of a cane, speech is fluent. SKIN: Intact,warm,dry. PSYCH: Mood and behavior normal.   Representative images of CT scans of the chest performed.  Image #1 (on the left) from 28 March 2021 CT showing groundglass attenuation on the prior radiation field.  Image #2 09 May 2021 chest CT showing resolution of this attenuation indicating improvement on radiation pneumonitis changes.        Assessment & Plan:     ICD-10-CM   1. COPD, severity to be determined [facility])  J44.9 Pulmonary Function Test [facility] Only   Will obtain PFTs Not tolerating Breztri well Trial of Trelegy    2. 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 Squamous cell carcinoma of right lung [facility])  C34.91    This issue adds complexity to his management Being managed by oncology    4. Former smoker  Z87.891    No evidence of relapse     Orders Placed This Encounter  Procedures   Pulmonary Function Test [facility] Only    Standing Status:   Future    Standing Expiration Date:   05/22/2022    Scheduling Instructions:     Next available.    Order Specific Question:   Full PFT: includes the following: basic spirometry, spirometry pre & post bronchodilator, diffusion capacity (DLCO), lung volumes    Answer:   Full PFT   Meds ordered this encounter  Medications   Fluticasone-Umeclidin-Vilant  (TRELEGY ELLIPTA) 100-62.5-25 MCG/ACT AEPB    Sig: Inhale 1 puff into the lungs daily.    Dispense:  60 each    Refill:  0    Order Specific Question:   Lot Number?    Answer:   [id number]    Order Specific Question:   Expiration Date?    Answer:   09/19/2022    Order Specific Question:   Manufacturer?    Answer:   [employer] [[id number]]    Order Specific Question:   Quantity    Answer:   2   We are giving the patient a trial of Trelegy Ellipta.  He was instructed to discontinue Breztri while on Trelegy.  He is to let us know how this medication does for him.  We will see the patient in for follow-up in 2 months time, he is to call sooner should any new difficulties arise.   Renold Don, MD Advanced Bronchoscopy PCCM [facility]    *This note was dictated using voice recognition software/Dragon.  Despite best efforts to proofread, errors can occur which can change the meaning.  Any change was purely unintentional.

## 2021-05-24 ENCOUNTER — Telehealth: Payer: Self-pay | Admitting: Pulmonary Disease

## 2021-05-24 MED ORDER — TRELEGY ELLIPTA 100-62.5-25 MCG/ACT IN AEPB: 1.0000 | INHALATION_SPRAY | Freq: Every day | RESPIRATORY_TRACT | 0 refills | Status: DC

## 2021-05-24 NOTE — Telephone Encounter (Signed)
Spoke to patient, who is requesting 90 day of trelegy to [facility]. Rx sent to preferred pharmacy.  Nothing further needed.

## 2021-05-24 NOTE — Telephone Encounter (Signed)
Joshua [name] had called the patient about scheduling his PFT. They have spoke and Covid is scheduled on 06/19/21 and PFT scheduled on 06/20/21 @ 2:00pm

## 2021-05-24 NOTE — Telephone Encounter (Signed)
Pt is calling to speak with [facility] about the inhaler Trilogy. Tier 3 drug under his insurance plan. Rhonda J Cobb

## 2021-05-30 ENCOUNTER — Other Ambulatory Visit: Payer: Self-pay

## 2021-05-30 ENCOUNTER — Other Ambulatory Visit: Payer: Self-pay | Admitting: *Deleted

## 2021-05-30 ENCOUNTER — Inpatient Hospital Stay: Payer: Medicare Other

## 2021-05-30 ENCOUNTER — Inpatient Hospital Stay: Payer: Medicare Other | Attending: Oncology

## 2021-05-30 ENCOUNTER — Encounter: Payer: Self-pay | Admitting: Oncology

## 2021-05-30 ENCOUNTER — Ambulatory Visit: Admission: RE | Admit: 2021-05-30 | Payer: Medicare Other | Source: Ambulatory Visit | Admitting: Radiation Oncology

## 2021-05-30 ENCOUNTER — Inpatient Hospital Stay (HOSPITAL_BASED_OUTPATIENT_CLINIC_OR_DEPARTMENT_OTHER): Payer: Medicare Other | Admitting: Oncology

## 2021-05-30 VITALS — BP 142/91 | HR 93 | Temp 97.8°F | Resp 20 | Wt 203.5 lb

## 2021-05-30 DIAGNOSIS — Z79890 Hormone replacement therapy (postmenopausal): Secondary | ICD-10-CM | POA: Insufficient documentation

## 2021-05-30 DIAGNOSIS — C3431 Malignant neoplasm of lower lobe, right bronchus or lung: Secondary | ICD-10-CM | POA: Diagnosis present

## 2021-05-30 DIAGNOSIS — C3491 Malignant neoplasm of unspecified part of right bronchus or lung: Secondary | ICD-10-CM

## 2021-05-30 DIAGNOSIS — Z95828 Presence of other vascular implants and grafts: Secondary | ICD-10-CM

## 2021-05-30 DIAGNOSIS — E032 Hypothyroidism due to medicaments and other exogenous substances: Secondary | ICD-10-CM | POA: Insufficient documentation

## 2021-05-30 DIAGNOSIS — Z5112 Encounter for antineoplastic immunotherapy: Secondary | ICD-10-CM | POA: Insufficient documentation

## 2021-05-30 LAB — CBC WITH DIFFERENTIAL/PLATELET
Abs Immature Granulocytes: 0.02 10*3/uL (ref 0.00–0.07)
Basophils Absolute: 0.1 10*3/uL (ref 0.0–0.1)
Basophils Relative: 1 %
Eosinophils Absolute: 0.1 10*3/uL (ref 0.0–0.5)
Eosinophils Relative: 2 %
HCT: 38.2 % — ABNORMAL LOW (ref 39.0–52.0)
Hemoglobin: 12.5 g/dL — ABNORMAL LOW (ref 13.0–17.0)
Immature Granulocytes: 0 %
Lymphocytes Relative: 16 %
Lymphs Abs: 1 10*3/uL (ref 0.7–4.0)
MCH: 28.9 pg (ref 26.0–34.0)
MCHC: 32.7 g/dL (ref 30.0–36.0)
MCV: 88.4 fL (ref 80.0–100.0)
Monocytes Absolute: 0.5 10*3/uL (ref 0.1–1.0)
Monocytes Relative: 8 %
Neutro Abs: 4.6 10*3/uL (ref 1.7–7.7)
Neutrophils Relative %: 73 %
Platelets: 177 10*3/uL (ref 150–400)
RBC: 4.32 MIL/uL (ref 4.22–5.81)
RDW: 15.1 % (ref 11.5–15.5)
WBC: 6.3 10*3/uL (ref 4.0–10.5)
nRBC: 0 % (ref 0.0–0.2)

## 2021-05-30 LAB — COMPREHENSIVE METABOLIC PANEL
ALT: 16 U/L (ref 0–44)
AST: 23 U/L (ref 15–41)
Albumin: 4.4 g/dL (ref 3.5–5.0)
Alkaline Phosphatase: 75 U/L (ref 38–126)
Anion gap: 9 (ref 5–15)
BUN: 19 mg/dL (ref 8–23)
CO2: 26 mmol/L (ref 22–32)
Calcium: 8.8 mg/dL — ABNORMAL LOW (ref 8.9–10.3)
Chloride: 104 mmol/L (ref 98–111)
Creatinine, Ser: 1.25 mg/dL — ABNORMAL HIGH (ref 0.61–1.24)
GFR, Estimated: >60 mL/min (ref >60)
Glucose, Bld: 127 mg/dL — ABNORMAL HIGH (ref 70–99)
Potassium: 3.7 mmol/L (ref 3.5–5.1)
Sodium: 139 mmol/L (ref 135–145)
Total Bilirubin: 0.4 mg/dL (ref 0.3–1.2)
Total Protein: 7.6 g/dL (ref 6.5–8.1)

## 2021-05-30 LAB — TSH: TSH: 190 u[IU]/mL — ABNORMAL HIGH (ref 0.350–4.500)

## 2021-05-30 MED ORDER — HEPARIN SOD (PORK) LOCK FLUSH 100 UNIT/ML IV SOLN
500.0000 [IU] | Freq: Once | INTRAVENOUS | Status: DC
  Filled 2021-05-30: qty 5

## 2021-05-30 MED ORDER — SODIUM CHLORIDE 0.9 % IV SOLN
Freq: Once | INTRAVENOUS | Status: AC
  Filled 2021-05-30: qty 250

## 2021-05-30 MED ORDER — SODIUM CHLORIDE 0.9% FLUSH
10.0000 mL | INTRAVENOUS | Status: DC | PRN
  Administered 2021-05-30: 10 mL via INTRAVENOUS
  Filled 2021-05-30: qty 10

## 2021-05-30 MED ORDER — LEVOTHYROXINE SODIUM 75 MCG PO TABS: 75.0000 ug | ORAL_TABLET | Freq: Every day | ORAL | 0 refills | Status: DC

## 2021-05-30 MED ORDER — SODIUM CHLORIDE 0.9 % IV SOLN
10.0000 mg/kg | Freq: Once | INTRAVENOUS | Status: AC
  Administered 2021-05-30: 860 mg via INTRAVENOUS
  Filled 2021-05-30: qty 7.2

## 2021-05-30 MED ORDER — HEPARIN SOD (PORK) LOCK FLUSH 100 UNIT/ML IV SOLN
500.0000 [IU] | Freq: Once | INTRAVENOUS | Status: AC | PRN
  Administered 2021-05-30: 500 [IU]
  Filled 2021-05-30: qty 5

## 2021-05-30 NOTE — Progress Notes (Signed)
Hematology/Oncology Progress note  Patient Care Team: [name], MD as PCP - General (Physician Assistant) [name], MD as Referring Physician (Physician Assistant) Bary Castilla, [name], MD (General Surgery) Telford Nab, RN as Oncology Nurse Navigator  REFERRING PROVIDER: [name], MD  CHIEF COMPLAINTS/REASON FOR VISIT:  Follow up for lung cancer  HISTORY OF PRESENTING ILLNESS:   Joshua [name] is a  65 y.o.  male with PMH listed below was seen in consultation at the request of  [name], MD  for evaluation of lung mass  Patient presented to emergency room on 08/25/2020 for evaluation of hemoptysis, coughing.  He coughed up small amount of blood and a large clot.  No fever, chills, chest pain, abdominal pain.  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  Patient is on Plavix and Eliquis.  Since his ER visit, he has not coughed up more blood.  08/25/2020 CT angio chest PE with and without contrast showed ill-defined soft tissue adjacent to the right upper lobe bronchus resulting in narrowing of right upper lobe bronchus.  Also right paratracheal mediastinal adenopathy.  Scattered centrilobular groundglass opacities within the right lower lobe and to a lesser extent within the right lower lobe likely reflecting an infectious or inflammatory process. # 09/14/2020, right upper lobe lung biopsy and bronchoscopy brushing are both positive for squamous cell carcinoma.Right precarinal space lymph node insufficient lymph node sampling for staging. 09/18/2020, PET scan showed right upper lobe nodule maximal SUV 11.5.  Hypermetabolic cluster right paratracheal adenopathy with maximum SUV up to 12.  No findings of metastatic disease to the neck/abdomen/pelvis or skeleton.  # 10/03/2000, MRI brain with and without contrast showed no  evidence of intracranial metastatic disease. 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  # - NGS showed PD-L1 TPS 95%, LRP1B S915fs, PIK3CA E545K, RB1 c1422-2A>T, RIT1 T83_ A84del, STK11 P224fs, TPS53 R248W, TMB 19.59mut/mb, MS stable.   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  Management per ENT   #10/10/2020 started concurrent chemoradiation.  Patient received 4 cycles of carboplatin AUC of 2 and Taxol 45 mg per metered square  Additional chemotherapy was held due to thrombocytopenia, neutropenia Patient finished radiation on 11/27/2020  #12/31/2020 -03/21/2021, Durvalumab maintenance  03/28/2021, CT without contrast showed new linear right perihilar consolidation with associated traction bronchiectasis.  Likely postradiation changes.  Additional new peribronchial vascular/nodular groundglass opacities with areas of consolidation or seen primarily in the right upper and lower lobes, more distant from expected radiation fields.  Findings are possibly due to radiation-induced organizing pneumonia.  Or superimposed drug toxicity.  Recommend follow-up CT chest in 1 to 2 months for assessment of evaluation.  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  Patient was seen by pulmonology Dr. Patsey Berthold. #   INTERVAL HISTORY Joshua [name] is a 65 y.o. male who has above history reviewed by me today presents for follow up visit for Stage III lung squamous cell carcinoma. 05/09/2021, repeat CT chest without contrast showed evolving radiation changes in the right perihilar region likely accounting for interval increase to thickening of the posterior wall of the right upper lobe bronchus.  The more peripheral right lung groundglass opacities seen on most recent study have generally improved with exception of one peripherally right upper lobe which appears new.  These  are likely  inflammatory.  No findings highly suspicious for local recurrence.  Patient reports no new complaints.  He feels well today.  He follows up with pulmonology Recently has started on Trelegy Ellipta.  Review of Systems  Constitutional:  Negative for appetite change, chills, fatigue, fever and unexpected weight change.  HENT:   Negative for hearing loss and voice change.   Eyes:  Negative for eye problems and icterus.  Respiratory:  Positive for shortness of breath. Negative for chest tightness, cough and hemoptysis.   Cardiovascular:  Negative for chest pain and leg swelling.  Gastrointestinal:  Negative for abdominal distention and abdominal pain.  Endocrine: Negative for hot flashes.  Genitourinary:  Negative for difficulty urinating, dysuria and frequency.   Musculoskeletal:  Negative for arthralgias.  Skin:  Negative for itching and rash.  Neurological:  Negative for light-headedness and numbness.  Hematological:  Negative for adenopathy. Does not bruise/bleed easily.  Psychiatric/Behavioral:  Negative for confusion.    MEDICAL HISTORY:  Past Medical History:  Diagnosis Date   Allergy    Cancer ([place])    lung   Dyspnea    former smoker. only doe   History of kidney stones    Hypertension    Peripheral vascular disease ([place])    Pre-diabetes    Sleep apnea     SURGICAL HISTORY: Past Surgical History:  Procedure Laterality Date   ANTERIOR CERVICAL DECOMP/DISCECTOMY FUSION N/A 03/14/2020   Procedure: ANTERIOR CERVICAL DECOMPRESSION/DISCECTOMY FUSION 3 LEVELS C4-7;  Surgeon: Meade Maw, MD;  Location: [facility];  Service: Neurosurgery;  Laterality: N/A;   BACK SURGERY  2011   CENTRAL LINE INSERTION Right 09/10/2016   Procedure: CENTRAL LINE INSERTION;  Surgeon: Katha Cabal, MD;  Location: [facility];  Service: Vascular;  Laterality: Right;   CERVICAL FUSION     C4-c7 on Mar 14, 2020   COLONOSCOPY  2013 ?   CORONARY ANGIOPLASTY     FEMORAL-POPLITEAL BYPASS GRAFT  Right 09/10/2016   Procedure: BYPASS GRAFT FEMORAL-POPLITEAL ARTERY ( BELOW KNEE );  Surgeon: Katha Cabal, MD;  Location: [facility];  Service: Vascular;  Laterality: Right;   FEMORAL-TIBIAL BYPASS GRAFT Right 03/23/2020   Procedure: BYPASS GRAFT RIGHT FEMORAL- DISTAL TIBIAL ARTERY WITH DISTAL FLOW GRAFT;  Surgeon: Katha Cabal, MD;  Location: [facility];  Service: Vascular;  Laterality: Right;   LEFT HEART CATH AND CORONARY ANGIOGRAPHY Left 05/16/2021   Procedure: LEFT HEART CATH AND CORONARY ANGIOGRAPHY;  Surgeon: Andrez Grime, MD;  Location: [place] [facility];  Service: Cardiovascular;  Laterality: Left;   LOWER EXTREMITY ANGIOGRAPHY Right 11/18/2016   Procedure: Lower Extremity Angiography;  Surgeon: Katha Cabal, MD;  Location: [place] [facility];  Service: Cardiovascular;  Laterality: Right;   LOWER EXTREMITY ANGIOGRAPHY Right 12/09/2016   Procedure: Lower Extremity Angiography;  Surgeon: Katha Cabal, MD;  Location: [place] [facility];  Service: Cardiovascular;  Laterality: Right;   LOWER EXTREMITY ANGIOGRAPHY Right 11/15/2019   Procedure: LOWER EXTREMITY ANGIOGRAPHY;  Surgeon: Katha Cabal, MD;  Location: [place] [facility];  Service: Cardiovascular;  Laterality: Right;   LOWER EXTREMITY ANGIOGRAPHY Right 12/01/2019   Procedure: Lower Extremity Angiography;  Surgeon: Algernon Huxley, MD;  Location: [place] [facility];  Service: Cardiovascular;  Laterality: Right;   LOWER EXTREMITY ANGIOGRAPHY Right 12/02/2019   Procedure: LOWER EXTREMITY ANGIOGRAPHY;  Surgeon: Katha Cabal, MD;  Location: [place] [facility];  Service: Cardiovascular;  Laterality: Right;   LOWER EXTREMITY ANGIOGRAPHY  Right 03/23/2020   Procedure: Lower Extremity Angiography;  Surgeon: Katha Cabal, MD;  Location: [place] [facility];  Service: Cardiovascular;  Laterality: Right;   LOWER EXTREMITY INTERVENTION  11/18/2016   Procedure: Lower Extremity Intervention;  Surgeon: Katha Cabal, MD;  Location: [place] [facility];  Service: Cardiovascular;;   PERIPHERAL VASCULAR CATHETERIZATION Right 01/02/2015   Procedure: Lower Extremity Angiography;  Surgeon: Katha Cabal, MD;  Location: [place] [facility];  Service: Cardiovascular;  Laterality: Right;   PERIPHERAL VASCULAR CATHETERIZATION Right 06/18/2015   Procedure: Lower Extremity Angiography;  Surgeon: Algernon Huxley, MD;  Location: [place] [facility];  Service: Cardiovascular;  Laterality: Right;   PERIPHERAL VASCULAR CATHETERIZATION  06/18/2015   Procedure: Lower Extremity Intervention;  Surgeon: Algernon Huxley, MD;  Location: [place] [facility];  Service: Cardiovascular;;   PERIPHERAL VASCULAR CATHETERIZATION N/A 10/09/2015   Procedure: Abdominal Aortogram w/Lower Extremity;  Surgeon: Katha Cabal, MD;  Location: [place] [facility];  Service: Cardiovascular;  Laterality: N/A;   PERIPHERAL VASCULAR CATHETERIZATION  10/09/2015   Procedure: Lower Extremity Intervention;  Surgeon: Katha Cabal, MD;  Location: [place] [facility];  Service: Cardiovascular;;   PERIPHERAL VASCULAR CATHETERIZATION Right 10/10/2015   Procedure: Lower Extremity Angiography;  Surgeon: Algernon Huxley, MD;  Location: [place] [facility];  Service: Cardiovascular;  Laterality: Right;   PERIPHERAL VASCULAR CATHETERIZATION Right 10/10/2015   Procedure: Lower Extremity Intervention;  Surgeon: Algernon Huxley, MD;  Location: [place] [facility];  Service: Cardiovascular;  Laterality: Right;   PERIPHERAL VASCULAR CATHETERIZATION Right 12/03/2015   Procedure: Lower Extremity Angiography;  Surgeon: Algernon Huxley, MD;  Location: [place] [facility];  Service: Cardiovascular;  Laterality: Right;   PERIPHERAL VASCULAR CATHETERIZATION Right 12/04/2015   Procedure: Lower Extremity Angiography;  Surgeon: Algernon Huxley, MD;  Location: [place] [facility];  Service: Cardiovascular;  Laterality: Right;   PERIPHERAL VASCULAR CATHETERIZATION  12/04/2015    Procedure: Lower Extremity Intervention;  Surgeon: Algernon Huxley, MD;  Location: [place] [facility];  Service: Cardiovascular;;   PERIPHERAL VASCULAR CATHETERIZATION Right 06/30/2016   Procedure: Lower Extremity Angiography;  Surgeon: Algernon Huxley, MD;  Location: [place] [facility];  Service: Cardiovascular;  Laterality: Right;   PERIPHERAL VASCULAR CATHETERIZATION Right 06/25/2016   Procedure: Lower Extremity Angiography;  Surgeon: Katha Cabal, MD;  Location: [place] [facility];  Service: Cardiovascular;  Laterality: Right;   PERIPHERAL VASCULAR CATHETERIZATION Right 07/01/2016   Procedure: Lower Extremity Angiography;  Surgeon: Katha Cabal, MD;  Location: [place] [facility];  Service: Cardiovascular;  Laterality: Right;   PERIPHERAL VASCULAR CATHETERIZATION  07/01/2016   Procedure: Lower Extremity Intervention;  Surgeon: Katha Cabal, MD;  Location: [place] [facility];  Service: Cardiovascular;;   PERIPHERAL VASCULAR CATHETERIZATION Right 08/01/2016   Procedure: Lower Extremity Angiography;  Surgeon: Katha Cabal, MD;  Location: [place] [facility];  Service: Cardiovascular;  Laterality: Right;   PORTA CATH INSERTION N/A 10/05/2020   Procedure: PORTA CATH INSERTION;  Surgeon: Katha Cabal, MD;  Location: [place] [facility];  Service: Cardiovascular;  Laterality: N/A;   stent placement in right leg Right    VIDEO BRONCHOSCOPY N/A 09/14/2020   Procedure: VIDEO BRONCHOSCOPY WITH FLUORO;  Surgeon: Tyler Pita, MD;  Location: [facility];  Service: Cardiopulmonary;  Laterality: N/A;   VIDEO BRONCHOSCOPY WITH ENDOBRONCHIAL ULTRASOUND N/A 09/14/2020   Procedure: VIDEO BRONCHOSCOPY WITH ENDOBRONCHIAL ULTRASOUND;  Surgeon: Vernard Gambles  L, MD;  Location: [facility];  Service: Cardiopulmonary;  Laterality: N/A;    SOCIAL HISTORY: Social History   Socioeconomic History   Marital status: Single    Spouse name: Not on file   Number of children: Not on file    Years of education: Not on file   Highest education level: Not on file  Occupational History   Occupation: unemployed  Tobacco Use   Smoking status: Former    Packs/day: 2.00    Years: 38.00    Pack years: 76.00    Types: Cigarettes    Quit date: 12/20/2014    Years since quitting: 6.4   Smokeless tobacco: Former   Tobacco comments:    quit smoking in 2017 most smoked 2   Vaping Use   Vaping Use: Never used  Substance and Sexual Activity   Alcohol use: Not Currently   Drug use: No   Sexual activity: Yes  Other Topics Concern   Not on file  Social History Narrative   Not on file   Social Determinants of Health   Financial Resource Strain: Not on file  Food Insecurity: Not on file  Transportation Needs: Not on file  Physical Activity: Not on file  Stress: Not on file  Social Connections: Not on file  Intimate Partner Violence: Not on file    FAMILY HISTORY: Family History  Problem Relation Age of Onset   Diabetes Mother    Hypertension Mother    Heart murmur Mother    Leukemia Mother    Throat cancer Maternal Grandmother    Colon cancer Neg Hx    Breast cancer Neg Hx     ALLERGIES:  is allergic to tape.  MEDICATIONS:  Current Outpatient Medications  Medication Sig Dispense Refill   acetaminophen (TYLENOL) 325 MG tablet Take 650 mg by mouth every 6 (six) hours as needed for moderate pain.     clopidogrel (PLAVIX) 75 MG tablet TAKE 1 TABLET BY MOUTH  DAILY 90 tablet 3   ELIQUIS 5 MG TABS tablet TAKE 1 TABLET BY MOUTH  TWICE DAILY 180 tablet 3   Fluticasone-Umeclidin-Vilant (TRELEGY ELLIPTA) 100-62.5-25 MCG/ACT AEPB Inhale 1 puff into the lungs daily. 180 each 0   gabapentin (NEURONTIN) 300 MG capsule Take 2 capsules (600 mg total) by mouth 3 (three) times daily. (Patient taking differently: Take 300-900 mg by mouth at bedtime as needed (pain).) 540 capsule 1   levothyroxine (SYNTHROID) 50 MCG tablet TAKE 1 TABLET BY MOUTH ONCE DAILY BEFORE BREAKFAST 30 tablet 0    lidocaine-prilocaine (EMLA) cream Apply to affected area once (Patient taking differently: Apply 1 application topically daily as needed (port access).) 30 g 3   neomycin-polymyxin-hydrocortisone (CORTISPORIN) 3.5-10000-1 ophthalmic suspension Place 1 drop into both eyes daily as needed (dry eyes).     simvastatin (ZOCOR) 40 MG tablet Take 40 mg by mouth at bedtime.     No current facility-administered medications for this visit.     PHYSICAL EXAMINATION: ECOG PERFORMANCE STATUS: 1 - Symptomatic but completely ambulatory Vitals:   05/30/21 0924  BP: (!) 142/91  Pulse: 93  Resp: 20  Temp: 97.8 F (36.6 C)  SpO2: 100%   Filed Weights   05/30/21 0924  Weight: 203 lb 8 oz (92.3 kg)    Physical Exam Constitutional:      General: He is not in acute distress.    Comments: Walks with a cane.   HENT:     Head: Normocephalic and atraumatic.  Eyes:  General: No scleral icterus. Cardiovascular:     Rate and Rhythm: Normal rate and regular rhythm.     Heart sounds: Normal heart sounds.  Pulmonary:     Effort: Pulmonary effort is normal. No respiratory distress.     Breath sounds: No wheezing.  Abdominal:     General: Bowel sounds are normal. There is no distension.     Palpations: Abdomen is soft.  Musculoskeletal:        General: No deformity. Normal range of motion.     Cervical back: Normal range of motion and neck supple.  Skin:    General: Skin is warm and dry.     Findings: No erythema or rash.  Neurological:     Mental Status: He is alert and oriented to person, place, and time. Mental status is at baseline.     Cranial Nerves: No cranial nerve deficit.     Coordination: Coordination normal.  Psychiatric:        Mood and Affect: Mood normal.    LABORATORY DATA:  I have reviewed the data as listed Lab Results  Component Value Date   WBC 6.3 05/30/2021   HGB 12.5 (L) 05/30/2021   HCT 38.2 (L) 05/30/2021   MCV 88.4 05/30/2021   PLT 177 05/30/2021   Recent  Labs    03/21/21 0955 04/04/21 0824 05/30/21 0914  NA 138 138 139  K 3.6 3.4* 3.7  CL 101 104 104  CO2 $Re'27 28 26  'QDz$ GLUCOSE 139* 134* 127*  BUN $Re'16 11 19  'Fjj$ CREATININE 1.14 1.22 1.25*  CALCIUM 9.1 8.8* 8.8*  GFRNONAA >60 >60 >60  PROT 7.8 7.4 7.6  ALBUMIN 4.3 3.9 4.4  AST $Re'26 24 23  'Dlw$ ALT $R'14 14 16  'RA$ ALKPHOS 78 70 75  BILITOT 0.5 0.4 0.4    Iron/TIBC/Ferritin/ %Sat    Component Value Date/Time   IRON 48 08/30/2020 1052   TIBC 391 08/30/2020 1052   FERRITIN 25 08/30/2020 1052   IRONPCTSAT 12 (L) 08/30/2020 1052      RADIOGRAPHIC STUDIES: I have personally reviewed the radiological images as listed and agreed with the findings in the report. CT CHEST WO CONTRAST  Result Date: 05/10/2021 CLINICAL DATA:  Non-small cell lung cancer for routine follow-up. 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 EXAM: CT CHEST WITHOUT CONTRAST TECHNIQUE: Multidetector CT imaging of the chest was performed following the standard protocol without IV contrast. COMPARISON:  CT chest 03/28/2021 and 12/25/2020.  PET-CT 09/18/2020. FINDINGS: Cardiovascular: Right IJ Port-A-Cath extends to the superior cavoatrial junction. Mild coronary artery atherosclerosis. No significant vascular findings on noncontrast imaging. The heart size is normal. There is no pericardial effusion. Mediastinum/Nodes: There are no enlarged mediastinal, hilar or axillary lymph nodes.Stable 7 mm right paratracheal node on image 61/2. There is new thickening of the posterior wall of the right upper lobe bronchus, measuring up to 1.2 cm on image 62/2. Right hilar store shin and peribronchial opacities are otherwise similar, attributed to prior radiation therapy. The thyroid gland, trachea and esophagus demonstrate no significant findings. Lungs/Pleura: There is no pleural effusion or pneumothorax. Evolving radiation changes in the right perihilar region with some interval contraction of the overall  process. Patchy ground-glass opacities more peripherally in the right lung have generally improved in the interval, with the exception of a single right upper lobe component measuring 10 mm on image 45/3 which appears new. There are no new or enlarging solid pulmonary nodules. The left lung appears  clear. Upper abdomen: The visualized upper abdomen appears stable without significant findings. Musculoskeletal/Chest wall: There is no chest wall mass or suspicious osseous finding. Status post lower cervical fusion. IMPRESSION: 1. Evolving radiation changes in the right perihilar region, likely accounting for interval increased thickening of the posterior wall of the right upper lobe bronchus. 2. The more peripheral right lung ground-glass opacities seen on the most recent study have generally improved with exception of one peripherally in the right upper lobe which appears new. Again, these are likely inflammatory, potentially drug reaction. Continued follow-up recommended. 3. No findings highly suspicious for local recurrence or metastatic disease. Electronically Signed   By: Richardean Sale M.D.   On: 05/10/2021 21:52   CT Chest Wo Contrast  Result Date: 03/29/2021 CLINICAL DATA:  Lung cancer follow-up EXAM: CT CHEST WITHOUT CONTRAST TECHNIQUE: Multidetector CT imaging of the chest was performed following the standard protocol without IV contrast. COMPARISON:  Chest CT dated July 27, 2020 FINDINGS: Cardiovascular: Normal heart size. Trace pericardial fluid. Calcifications of the RCA and LAD. Right chest wall port with tip position in the upper right atrium minimal calcified plaque of the thoracic aorta. Mediastinum/Nodes: Unchanged size of mediastinal lymph nodes. Stable right upper paratracheal lymph node measuring 8 mm on series 2, image 39 stable right lower paratracheal lymph node measuring 1.0 cm on image 68. Similar soft tissue stranding of the right mediastinum and hilum, likely postradiation change.  Esophagus and thyroid are unremarkable. Lungs/Pleura: Central airways are patent. New linear right perihilar consolidation opacity with associated traction bronchiectasis and surrounding ground-glass.Additional peribronchovascular and nodular ground-glass opacities with areas of consolidation are seen primarily in the right upper and lower lobes which are more distant from the expected radiation field. Reference ground-glass opacity of the right lower lobe measuring up to 1.5 cm on series 3, image 98. Upper Abdomen: No acute abnormality. Musculoskeletal: No chest wall mass or suspicious bone lesions identified. IMPRESSION: New linear right perihilar consolidation with associated traction bronchiectasis, likely postradiation change. Additional new peribronchovascular and nodular ground-glass opacities with areas of consolidation are seen primarily in the right upper and lower lobes which are more distant from the expected radiation field, findings are possibly due to radiation induced organizing pneumonia or superimposed drug toxicity. Recommend follow-up chest CT in 1-2 months to assess evolution. Unchanged size of mediastinal lymph nodes. Aortic Atherosclerosis (ICD10-I70.0) and Emphysema (ICD10-J43.9). Electronically Signed   By: Yetta Glassman M.D.   On: 03/29/2021 11:39   CARDIAC CATHETERIZATION  Result Date: 05/16/2021   The left ventricular systolic function is normal.   The left ventricular ejection fraction is 55-65% by visual estimate.   There is no aortic valve stenosis.   No angiographically apparent coronary artery disease Conclusion: No angiographically apparent coronary artery disease Normal LVEDP Normal LV systolic function Recommendations: Resume eliquis tomorrow morning Continue plavix for PAD indication Ongoing aggressive secondary prevention Follow up with Dr. Clayborn Bigness in 2 weeks       ASSESSMENT & PLAN:  1. Squamous cell carcinoma of right lung ([place])   2. Encounter for  antineoplastic immunotherapy   3. Hypothyroidism due to medication   4. Port-A-Cath in place   Cancer Staging Squamous cell carcinoma of right lung [facility]) Staging form: Lung, AJCC 8th Edition - Clinical stage from 09/20/2020: Stage IIIA (cT1b, cN2, cM0) - Signed by [name], MD on 09/20/2020   #Squamous cell carcinoma of right lung, stage IIIA, Recent CT scan was independently reviewed by me and discussed with patient. Per my  discussion with pulmonology Dr. 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  Recent CT showed improvement. Labs reviewed and discussed with patient.  Resume Durvalumab treatment today.   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   He is on Synthroid 50 MCG daily.  TSH showed persistently increased level at 190, improved since 86-month ago. I will increase his Synthroid to 27mcg                                                         All questions were answered. The patient knows to call the clinic with any problems questions or concerns.  cc [name], MD   Return of visit: Lab MD durvalumab in 2 weeks-  [name], MD, PhD Hematology Oncology [place] [facility]  05/30/2021

## 2021-05-30 NOTE — Patient Instructions (Signed)
[facility]  Discharge Instructions: Thank you for choosing [facility] to provide your oncology and hematology care.  If you have a lab appointment with the Cancer Center, please go directly to the Cancer Center and check in at the registration area.  Wear comfortable clothing and clothing appropriate for easy access to any Portacath or PICC line.   We strive to give you quality time with your provider. You may need to reschedule your appointment if you arrive late (15 or more minutes).  Arriving late affects you and other patients whose appointments are after yours.  Also, if you miss three or more appointments without notifying the office, you may be dismissed from the clinic at the provider's discretion.      For prescription refill requests, have your pharmacy contact our office and allow 72 hours for refills to be completed.      To help prevent nausea and vomiting after your treatment, we encourage you to take your nausea medication as directed.  BELOW ARE SYMPTOMS THAT SHOULD BE REPORTED IMMEDIATELY: *FEVER GREATER THAN 100.4 F (38 C) OR HIGHER *CHILLS OR SWEATING *NAUSEA AND VOMITING THAT IS NOT CONTROLLED WITH YOUR NAUSEA MEDICATION *UNUSUAL SHORTNESS OF BREATH *UNUSUAL BRUISING OR BLEEDING *URINARY PROBLEMS (pain or burning when urinating, or frequent urination) *BOWEL PROBLEMS (unusual diarrhea, constipation, pain near the anus) TENDERNESS IN MOUTH AND THROAT WITH OR WITHOUT PRESENCE OF ULCERS (sore throat, sores in mouth, or a toothache) UNUSUAL RASH, SWELLING OR PAIN  UNUSUAL VAGINAL DISCHARGE OR ITCHING   Items with * indicate a potential emergency and should be followed up as soon as possible or go to the Emergency Department if any problems should occur.  Please show the CHEMOTHERAPY ALERT CARD or IMMUNOTHERAPY ALERT CARD at check-in to the Emergency Department and triage nurse.  Should you have questions after your  visit or need to cancel or reschedule your appointment, please contact [facility]  [phone] and follow the prompts.  Office hours are 8:00 a.m. to 4:30 p.m. Monday - Friday. Please note that voicemails left after 4:00 p.m. may not be returned until the following business day.  We are closed weekends and major holidays. You have access to a nurse at all times for urgent questions. Please call the main number to the clinic [phone] and follow the prompts.  For any non-urgent questions, you may also contact your provider using MyChart. We now offer e-Visits for anyone 18 and older to request care online for non-urgent symptoms. For details visit [email or web address].   Also download the MyChart app! Go to the app store, search "MyChart", open the app, select [facility], and log in with your MyChart username and password.  Due to Covid, a mask is required upon entering the hospital/clinic. If you do not have a mask, one will be given to you upon arrival. For doctor visits, patients may have 1 support person aged 18 or older with them. For treatment visits, patients cannot have anyone with them due to current Covid guidelines and our immunocompromised population.  

## 2021-05-31 ENCOUNTER — Telehealth: Payer: Self-pay

## 2021-05-31 LAB — T4: T4, Total: 4.8 ug/dL (ref 4.5–12.0)

## 2021-05-31 NOTE — Telephone Encounter (Signed)
-----   Message from [name], MD sent at 05/30/2021  8:01 PM EST ----- Please let patient know that thyroid function is too low.  Recommend patient to stop Synthroid 50 MCG and start 75 MCG.  A new prescription has been sent to [facility].

## 2021-05-31 NOTE — Telephone Encounter (Signed)
Pt informed and verbalized understanding

## 2021-06-14 ENCOUNTER — Telehealth: Payer: Self-pay

## 2021-06-14 NOTE — Telephone Encounter (Signed)
Called and LVM in regards to upcoming appt on 11/30 for covid testing. Will send to triage.

## 2021-06-14 NOTE — Telephone Encounter (Signed)
Patient is aware of date/time of covid test.   

## 2021-06-19 ENCOUNTER — Other Ambulatory Visit: Payer: Self-pay

## 2021-06-19 ENCOUNTER — Other Ambulatory Visit
Admission: RE | Admit: 2021-06-19 | Discharge: 2021-06-19 | Disposition: A | Discharge status: Home / Self Care | Payer: Medicare Other | Source: Ambulatory Visit | Attending: Pulmonary Disease | Admitting: Pulmonary Disease

## 2021-06-19 DIAGNOSIS — U071 COVID-19: Secondary | ICD-10-CM | POA: Diagnosis not present

## 2021-06-19 DIAGNOSIS — Z01812 Encounter for preprocedural laboratory examination: Secondary | ICD-10-CM | POA: Insufficient documentation

## 2021-06-19 DIAGNOSIS — Z20822 Contact with and (suspected) exposure to covid-19: Secondary | ICD-10-CM

## 2021-06-19 DIAGNOSIS — Z1152 Encounter for screening for COVID-19: Secondary | ICD-10-CM

## 2021-06-20 ENCOUNTER — Encounter: Payer: Self-pay | Admitting: Oncology

## 2021-06-20 ENCOUNTER — Inpatient Hospital Stay: Payer: Medicare Other | Attending: Oncology

## 2021-06-20 ENCOUNTER — Inpatient Hospital Stay: Payer: Medicare Other

## 2021-06-20 ENCOUNTER — Ambulatory Visit: Payer: Medicare Other

## 2021-06-20 ENCOUNTER — Inpatient Hospital Stay (HOSPITAL_BASED_OUTPATIENT_CLINIC_OR_DEPARTMENT_OTHER): Payer: Medicare Other | Admitting: Oncology

## 2021-06-20 ENCOUNTER — Other Ambulatory Visit: Payer: Self-pay | Admitting: Unknown Physician Specialty

## 2021-06-20 ENCOUNTER — Telehealth: Payer: Self-pay

## 2021-06-20 VITALS — BP 131/80 | HR 87 | Temp 97.8°F | Wt 206.0 lb

## 2021-06-20 DIAGNOSIS — R1310 Dysphagia, unspecified: Secondary | ICD-10-CM | POA: Diagnosis not present

## 2021-06-20 DIAGNOSIS — Z5112 Encounter for antineoplastic immunotherapy: Secondary | ICD-10-CM | POA: Insufficient documentation

## 2021-06-20 DIAGNOSIS — Z95828 Presence of other vascular implants and grafts: Secondary | ICD-10-CM

## 2021-06-20 DIAGNOSIS — E032 Hypothyroidism due to medicaments and other exogenous substances: Secondary | ICD-10-CM | POA: Diagnosis not present

## 2021-06-20 DIAGNOSIS — U071 COVID-19: Secondary | ICD-10-CM | POA: Diagnosis not present

## 2021-06-20 DIAGNOSIS — R221 Localized swelling, mass and lump, neck: Secondary | ICD-10-CM

## 2021-06-20 DIAGNOSIS — C3491 Malignant neoplasm of unspecified part of right bronchus or lung: Secondary | ICD-10-CM | POA: Diagnosis not present

## 2021-06-20 DIAGNOSIS — C3411 Malignant neoplasm of upper lobe, right bronchus or lung: Secondary | ICD-10-CM | POA: Insufficient documentation

## 2021-06-20 LAB — CBC WITH DIFFERENTIAL/PLATELET
Abs Immature Granulocytes: 0.01 10*3/uL (ref 0.00–0.07)
Basophils Absolute: 0 10*3/uL (ref 0.0–0.1)
Basophils Relative: 1 %
Eosinophils Absolute: 0.2 10*3/uL (ref 0.0–0.5)
Eosinophils Relative: 5 %
HCT: 37.3 % — ABNORMAL LOW (ref 39.0–52.0)
Hemoglobin: 12.3 g/dL — ABNORMAL LOW (ref 13.0–17.0)
Immature Granulocytes: 0 %
Lymphocytes Relative: 18 %
Lymphs Abs: 0.7 10*3/uL (ref 0.7–4.0)
MCH: 29.6 pg (ref 26.0–34.0)
MCHC: 33 g/dL (ref 30.0–36.0)
MCV: 89.9 fL (ref 80.0–100.0)
Monocytes Absolute: 0.5 10*3/uL (ref 0.1–1.0)
Monocytes Relative: 11 %
Neutro Abs: 2.8 10*3/uL (ref 1.7–7.7)
Neutrophils Relative %: 65 %
Platelets: 128 10*3/uL — ABNORMAL LOW (ref 150–400)
RBC: 4.15 MIL/uL — ABNORMAL LOW (ref 4.22–5.81)
RDW: 14.6 % (ref 11.5–15.5)
WBC: 4.2 10*3/uL (ref 4.0–10.5)
nRBC: 0 % (ref 0.0–0.2)

## 2021-06-20 LAB — COMPREHENSIVE METABOLIC PANEL
ALT: 19 U/L (ref 0–44)
AST: 30 U/L (ref 15–41)
Albumin: 4.1 g/dL (ref 3.5–5.0)
Alkaline Phosphatase: 67 U/L (ref 38–126)
Anion gap: 10 (ref 5–15)
BUN: 13 mg/dL (ref 8–23)
CO2: 27 mmol/L (ref 22–32)
Calcium: 8.6 mg/dL — ABNORMAL LOW (ref 8.9–10.3)
Chloride: 103 mmol/L (ref 98–111)
Creatinine, Ser: 1.1 mg/dL (ref 0.61–1.24)
GFR, Estimated: >60 mL/min (ref >60)
Glucose, Bld: 155 mg/dL — ABNORMAL HIGH (ref 70–99)
Potassium: 3.2 mmol/L — ABNORMAL LOW (ref 3.5–5.1)
Sodium: 140 mmol/L (ref 135–145)
Total Bilirubin: 0.4 mg/dL (ref 0.3–1.2)
Total Protein: 7.4 g/dL (ref 6.5–8.1)

## 2021-06-20 LAB — SARS CORONAVIRUS 2 (TAT 6-24 HRS): SARS Coronavirus 2: POSITIVE — AB

## 2021-06-20 MED ORDER — SODIUM CHLORIDE 0.9% FLUSH
10.0000 mL | INTRAVENOUS | Status: DC | PRN
  Administered 2021-06-20: 10 mL via INTRAVENOUS
  Filled 2021-06-20: qty 10

## 2021-06-20 MED ORDER — POTASSIUM CHLORIDE CRYS ER 20 MEQ PO TBCR: 20.0000 meq | EXTENDED_RELEASE_TABLET | Freq: Every day | ORAL | 0 refills | Status: DC

## 2021-06-20 MED ORDER — HEPARIN SOD (PORK) LOCK FLUSH 100 UNIT/ML IV SOLN
500.0000 [IU] | Freq: Once | INTRAVENOUS | Status: AC
  Administered 2021-06-20: 500 [IU] via INTRAVENOUS
  Filled 2021-06-20: qty 5

## 2021-06-20 NOTE — Progress Notes (Signed)
Hematology/Oncology Progress note  Patient Care Team: [name], MD as PCP - General (Physician Assistant) [name], MD as Referring Physician (Physician Assistant) Bary Castilla, [name], MD (General Surgery) Telford Nab, RN as Oncology Nurse Navigator  REFERRING PROVIDER: [name], MD  CHIEF COMPLAINTS/REASON FOR VISIT:  Follow up for lung cancer  HISTORY OF PRESENTING ILLNESS:   Joshua [name] is a  65 y.o.  male with PMH listed below was seen in consultation at the request of  [name], MD  for evaluation of lung mass  Patient presented to emergency room on 08/25/2020 for evaluation of hemoptysis, coughing.  He coughed up small amount of blood and a large clot.  No fever, chills, chest pain, abdominal pain.  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  Patient is on Plavix and Eliquis.  Since his ER visit, he has not coughed up more blood.  08/25/2020 CT angio chest PE with and without contrast showed ill-defined soft tissue adjacent to the right upper lobe bronchus resulting in narrowing of right upper lobe bronchus.  Also right paratracheal mediastinal adenopathy.  Scattered centrilobular groundglass opacities within the right lower lobe and to a lesser extent within the right lower lobe likely reflecting an infectious or inflammatory process. # 09/14/2020, right upper lobe lung biopsy and bronchoscopy brushing are both positive for squamous cell carcinoma.Right precarinal space lymph node insufficient lymph node sampling for staging. 09/18/2020, PET scan showed right upper lobe nodule maximal SUV 11.5.  Hypermetabolic cluster right paratracheal adenopathy with maximum SUV up to 12.  No findings of metastatic disease to the neck/abdomen/pelvis or skeleton.  # 10/03/2000, MRI brain with and without contrast showed no  evidence of intracranial metastatic disease. 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  # - NGS showed PD-L1 TPS 95%, LRP1B S959fs, PIK3CA E545K, RB1 c1422-2A>T, RIT1 T83_ A84del, STK11 P252fs, TPS53 R248W, TMB 19.60mut/mb, MS stable.   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  Management per ENT   #10/10/2020 started concurrent chemoradiation.  Patient received 4 cycles of carboplatin AUC of 2 and Taxol 45 mg per metered square  Additional chemotherapy was held due to thrombocytopenia, neutropenia Patient finished radiation on 11/27/2020  #12/31/2020 -03/21/2021, Durvalumab maintenance  03/28/2021, CT without contrast showed new linear right perihilar consolidation with associated traction bronchiectasis.  Likely postradiation changes.  Additional new peribronchial vascular/nodular groundglass opacities with areas of consolidation or seen primarily in the right upper and lower lobes, more distant from expected radiation fields.  Findings are possibly due to radiation-induced organizing pneumonia.  Or superimposed drug toxicity.  Recommend follow-up CT chest in 1 to 2 months for assessment of evaluation.  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  Patient was seen by pulmonology Dr. Patsey Berthold. Per my discussion with pulmonology Dr. 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  Recent CT showed improvement.  05/09/2021, repeat CT chest without contrast showed evolving radiation changes in the right perihilar region likely accounting for interval increase to thickening of the posterior wall of the right upper lobe bronchus.  The more peripheral right lung groundglass opacities seen on most recent study have generally improved with exception of one peripherally right upper lobe which appears new.  These are likely inflammatory.  No findings highly suspicious  for local recurrence.  05/30/2021 resumed on Durvalumab.    INTERVAL HISTORY Joshua [name] is a 65 y.o. male who has above history reviewed by me today presents for follow up visit for Stage III lung squamous cell carcinoma. He reports feeling well today. Tickle in his throat.  He mentioned that he has had swallowing difficulty " food stuck sensation".since his cervical spine surgery last year. No bloody or black bowel movement.  He has not had colonoscopy in recent years. Last colonoscopy 2012? Otherwise he feels well. Denies fever, chills, cough, SOB more than his baseline.    Review of Systems  Constitutional:  Negative for appetite change, chills, fatigue, fever and unexpected weight change.  HENT:   Negative for hearing loss and voice change.        Tickle in his throat.   Eyes:  Negative for eye problems and icterus.  Respiratory:  Positive for shortness of breath. Negative for chest tightness, cough and hemoptysis.   Cardiovascular:  Negative for chest pain and leg swelling.  Gastrointestinal:  Negative for abdominal distention and abdominal pain.       Swallowing difficulty, food stuck sensation.   Endocrine: Negative for hot flashes.  Genitourinary:  Negative for difficulty urinating, dysuria and frequency.   Musculoskeletal:  Negative for arthralgias.  Skin:  Negative for itching and rash.  Neurological:  Negative for light-headedness and numbness.  Hematological:  Negative for adenopathy. Does not bruise/bleed easily.  Psychiatric/Behavioral:  Negative for confusion.    MEDICAL HISTORY:  Past Medical History:  Diagnosis Date   Allergy    Cancer ([place])    lung   Dyspnea    former smoker. only doe   History of kidney stones    Hypertension    Peripheral vascular disease ([place])    Pre-diabetes    Sleep apnea     SURGICAL HISTORY: Past Surgical History:  Procedure Laterality Date   ANTERIOR CERVICAL DECOMP/DISCECTOMY FUSION N/A 03/14/2020   Procedure:  ANTERIOR CERVICAL DECOMPRESSION/DISCECTOMY FUSION 3 LEVELS C4-7;  Surgeon: Meade Maw, MD;  Location: [facility];  Service: Neurosurgery;  Laterality: N/A;   BACK SURGERY  2011   CENTRAL LINE INSERTION Right 09/10/2016   Procedure: CENTRAL LINE INSERTION;  Surgeon: Katha Cabal, MD;  Location: [facility];  Service: Vascular;  Laterality: Right;   CERVICAL FUSION     C4-c7 on Mar 14, 2020   COLONOSCOPY  2013 ?   CORONARY ANGIOPLASTY     FEMORAL-POPLITEAL BYPASS GRAFT Right 09/10/2016   Procedure: BYPASS GRAFT FEMORAL-POPLITEAL ARTERY ( BELOW KNEE );  Surgeon: Katha Cabal, MD;  Location: [facility];  Service: Vascular;  Laterality: Right;   FEMORAL-TIBIAL BYPASS GRAFT Right 03/23/2020   Procedure: BYPASS GRAFT RIGHT FEMORAL- DISTAL TIBIAL ARTERY WITH DISTAL FLOW GRAFT;  Surgeon: Katha Cabal, MD;  Location: [facility];  Service: Vascular;  Laterality: Right;   LEFT HEART CATH AND CORONARY ANGIOGRAPHY Left 05/16/2021   Procedure: LEFT HEART CATH AND CORONARY ANGIOGRAPHY;  Surgeon: Andrez Grime, MD;  Location: [place] [facility];  Service: Cardiovascular;  Laterality: Left;   LOWER EXTREMITY ANGIOGRAPHY Right 11/18/2016   Procedure: Lower Extremity Angiography;  Surgeon: Katha Cabal, MD;  Location: [place] [facility];  Service: Cardiovascular;  Laterality: Right;   LOWER EXTREMITY ANGIOGRAPHY Right 12/09/2016   Procedure: Lower Extremity Angiography;  Surgeon: Katha Cabal, MD;  Location: [place] [facility];  Service: Cardiovascular;  Laterality: Right;   LOWER EXTREMITY ANGIOGRAPHY Right 11/15/2019   Procedure: LOWER EXTREMITY  ANGIOGRAPHY;  Surgeon: Katha Cabal, MD;  Location: [place] [facility];  Service: Cardiovascular;  Laterality: Right;   LOWER EXTREMITY ANGIOGRAPHY Right 12/01/2019   Procedure: Lower Extremity Angiography;  Surgeon: Algernon Huxley, MD;  Location: [place] [facility];  Service: Cardiovascular;  Laterality: Right;   LOWER EXTREMITY  ANGIOGRAPHY Right 12/02/2019   Procedure: LOWER EXTREMITY ANGIOGRAPHY;  Surgeon: Katha Cabal, MD;  Location: [place] [facility];  Service: Cardiovascular;  Laterality: Right;   LOWER EXTREMITY ANGIOGRAPHY Right 03/23/2020   Procedure: Lower Extremity Angiography;  Surgeon: Katha Cabal, MD;  Location: [place] [facility];  Service: Cardiovascular;  Laterality: Right;   LOWER EXTREMITY INTERVENTION  11/18/2016   Procedure: Lower Extremity Intervention;  Surgeon: Katha Cabal, MD;  Location: [place] [facility];  Service: Cardiovascular;;   PERIPHERAL VASCULAR CATHETERIZATION Right 01/02/2015   Procedure: Lower Extremity Angiography;  Surgeon: Katha Cabal, MD;  Location: [place] [facility];  Service: Cardiovascular;  Laterality: Right;   PERIPHERAL VASCULAR CATHETERIZATION Right 06/18/2015   Procedure: Lower Extremity Angiography;  Surgeon: Algernon Huxley, MD;  Location: [place] [facility];  Service: Cardiovascular;  Laterality: Right;   PERIPHERAL VASCULAR CATHETERIZATION  06/18/2015   Procedure: Lower Extremity Intervention;  Surgeon: Algernon Huxley, MD;  Location: [place] [facility];  Service: Cardiovascular;;   PERIPHERAL VASCULAR CATHETERIZATION N/A 10/09/2015   Procedure: Abdominal Aortogram w/Lower Extremity;  Surgeon: Katha Cabal, MD;  Location: [place] [facility];  Service: Cardiovascular;  Laterality: N/A;   PERIPHERAL VASCULAR CATHETERIZATION  10/09/2015   Procedure: Lower Extremity Intervention;  Surgeon: Katha Cabal, MD;  Location: [place] [facility];  Service: Cardiovascular;;   PERIPHERAL VASCULAR CATHETERIZATION Right 10/10/2015   Procedure: Lower Extremity Angiography;  Surgeon: Algernon Huxley, MD;  Location: [place] [facility];  Service: Cardiovascular;  Laterality: Right;   PERIPHERAL VASCULAR CATHETERIZATION Right 10/10/2015   Procedure: Lower Extremity Intervention;  Surgeon: Algernon Huxley, MD;  Location: [place] [facility];  Service:  Cardiovascular;  Laterality: Right;   PERIPHERAL VASCULAR CATHETERIZATION Right 12/03/2015   Procedure: Lower Extremity Angiography;  Surgeon: Algernon Huxley, MD;  Location: [place] [facility];  Service: Cardiovascular;  Laterality: Right;   PERIPHERAL VASCULAR CATHETERIZATION Right 12/04/2015   Procedure: Lower Extremity Angiography;  Surgeon: Algernon Huxley, MD;  Location: [place] [facility];  Service: Cardiovascular;  Laterality: Right;   PERIPHERAL VASCULAR CATHETERIZATION  12/04/2015   Procedure: Lower Extremity Intervention;  Surgeon: Algernon Huxley, MD;  Location: [place] [facility];  Service: Cardiovascular;;   PERIPHERAL VASCULAR CATHETERIZATION Right 06/30/2016   Procedure: Lower Extremity Angiography;  Surgeon: Algernon Huxley, MD;  Location: [place] [facility];  Service: Cardiovascular;  Laterality: Right;   PERIPHERAL VASCULAR CATHETERIZATION Right 06/25/2016   Procedure: Lower Extremity Angiography;  Surgeon: Katha Cabal, MD;  Location: [place] [facility];  Service: Cardiovascular;  Laterality: Right;   PERIPHERAL VASCULAR CATHETERIZATION Right 07/01/2016   Procedure: Lower Extremity Angiography;  Surgeon: Katha Cabal, MD;  Location: [place] [facility];  Service: Cardiovascular;  Laterality: Right;   PERIPHERAL VASCULAR CATHETERIZATION  07/01/2016   Procedure: Lower Extremity Intervention;  Surgeon: Katha Cabal, MD;  Location: [place] [facility];  Service: Cardiovascular;;   PERIPHERAL VASCULAR CATHETERIZATION Right 08/01/2016   Procedure: Lower Extremity Angiography;  Surgeon: Katha Cabal, MD;  Location: [place] [facility];  Service: Cardiovascular;  Laterality: Right;   PORTA CATH  INSERTION N/A 10/05/2020   Procedure: PORTA CATH INSERTION;  Surgeon: Katha Cabal, MD;  Location: [place] [facility];  Service: Cardiovascular;  Laterality: N/A;   stent placement in right leg Right    VIDEO BRONCHOSCOPY N/A 09/14/2020   Procedure: VIDEO BRONCHOSCOPY WITH  FLUORO;  Surgeon: Tyler Pita, MD;  Location: [facility];  Service: Cardiopulmonary;  Laterality: N/A;   VIDEO BRONCHOSCOPY WITH ENDOBRONCHIAL ULTRASOUND N/A 09/14/2020   Procedure: VIDEO BRONCHOSCOPY WITH ENDOBRONCHIAL ULTRASOUND;  Surgeon: Tyler Pita, MD;  Location: [facility];  Service: Cardiopulmonary;  Laterality: N/A;    SOCIAL HISTORY: Social History   Socioeconomic History   Marital status: Single    Spouse name: Not on file   Number of children: Not on file   Years of education: Not on file   Highest education level: Not on file  Occupational History   Occupation: unemployed  Tobacco Use   Smoking status: Former    Packs/day: 2.00    Years: 38.00    Pack years: 76.00    Types: Cigarettes    Quit date: 12/20/2014    Years since quitting: 6.5   Smokeless tobacco: Former   Tobacco comments:    quit smoking in 2017 most smoked 2   Vaping Use   Vaping Use: Never used  Substance and Sexual Activity   Alcohol use: Not Currently   Drug use: No   Sexual activity: Yes  Other Topics Concern   Not on file  Social History Narrative   Not on file   Social Determinants of Health   Financial Resource Strain: Not on file  Food Insecurity: Not on file  Transportation Needs: Not on file  Physical Activity: Not on file  Stress: Not on file  Social Connections: Not on file  Intimate Partner Violence: Not on file    FAMILY HISTORY: Family History  Problem Relation Age of Onset   Diabetes Mother    Hypertension Mother    Heart murmur Mother    Leukemia Mother    Throat cancer Maternal Grandmother    Colon cancer Neg Hx    Breast cancer Neg Hx     ALLERGIES:  is allergic to tape.  MEDICATIONS:  Current Outpatient Medications  Medication Sig Dispense Refill   acetaminophen (TYLENOL) 325 MG tablet Take 650 mg by mouth every 6 (six) hours as needed for moderate pain.     clopidogrel (PLAVIX) 75 MG tablet TAKE 1 TABLET BY MOUTH  DAILY 90 tablet 3   ELIQUIS 5  MG TABS tablet TAKE 1 TABLET BY MOUTH  TWICE DAILY 180 tablet 3   Fluticasone-Umeclidin-Vilant (TRELEGY ELLIPTA) 100-62.5-25 MCG/ACT AEPB Inhale 1 puff into the lungs daily. 180 each 0   gabapentin (NEURONTIN) 300 MG capsule Take 2 capsules (600 mg total) by mouth 3 (three) times daily. (Patient taking differently: Take 300-900 mg by mouth at bedtime as needed (pain).) 540 capsule 1   levothyroxine (SYNTHROID) 75 MCG tablet Take 1 tablet (75 mcg total) by mouth daily before breakfast. 30 tablet 0   lidocaine-prilocaine (EMLA) cream Apply to affected area once (Patient taking differently: Apply 1 application topically daily as needed (port access).) 30 g 3   neomycin-polymyxin-hydrocortisone (CORTISPORIN) 3.5-10000-1 ophthalmic suspension Place 1 drop into both eyes daily as needed (dry eyes).     potassium chloride SA (KLOR-CON M) 20 MEQ tablet Take 1 tablet (20 mEq total) by mouth daily. 30 tablet 0   simvastatin (ZOCOR) 40 MG tablet Take 40 mg by  mouth at bedtime.     No current facility-administered medications for this visit.   Facility-Administered Medications Ordered in Other Visits  Medication Dose Route Frequency Provider Last Rate Last Admin   sodium chloride flush (NS) 0.9 % injection 10 mL  10 mL Intravenous PRN [name], MD   10 mL at 06/20/21 1006     PHYSICAL EXAMINATION: ECOG PERFORMANCE STATUS: 1 - Symptomatic but completely ambulatory Vitals:   06/20/21 0908  BP: 131/80  Pulse: 87  Temp: 97.8 F (36.6 C)   Filed Weights   06/20/21 0908  Weight: 206 lb (93.4 kg)    Physical Exam Constitutional:      General: He is not in acute distress.    Comments: Walks with a cane.   HENT:     Head: Normocephalic and atraumatic.  Eyes:     General: No scleral icterus. Cardiovascular:     Rate and Rhythm: Normal rate and regular rhythm.     Heart sounds: Normal heart sounds.  Pulmonary:     Effort: Pulmonary effort is normal. No respiratory distress.     Breath sounds: No  wheezing.  Abdominal:     General: Bowel sounds are normal. There is no distension.     Palpations: Abdomen is soft.  Musculoskeletal:        General: No deformity. Normal range of motion.     Cervical back: Normal range of motion and neck supple.  Skin:    General: Skin is warm and dry.     Findings: No erythema or rash.  Neurological:     Mental Status: He is alert and oriented to person, place, and time. Mental status is at baseline.     Cranial Nerves: No cranial nerve deficit.     Coordination: Coordination normal.  Psychiatric:        Mood and Affect: Mood normal.    LABORATORY DATA:  I have reviewed the data as listed Lab Results  Component Value Date   WBC 4.2 06/20/2021   HGB 12.3 (L) 06/20/2021   HCT 37.3 (L) 06/20/2021   MCV 89.9 06/20/2021   PLT 128 (L) 06/20/2021   Recent Labs    04/04/21 0824 05/30/21 0914 06/20/21 0833  NA 138 139 140  K 3.4* 3.7 3.2*  CL 104 104 103  CO2 $Re'28 26 27  'Fms$ GLUCOSE 134* 127* 155*  BUN $Re'11 19 13  'BMd$ CREATININE 1.22 1.25* 1.10  CALCIUM 8.8* 8.8* 8.6*  GFRNONAA >60 >60 >60  PROT 7.4 7.6 7.4  ALBUMIN 3.9 4.4 4.1  AST $Re'24 23 30  'kaR$ ALT $R'14 16 19  'rG$ ALKPHOS 70 75 67  BILITOT 0.4 0.4 0.4    Iron/TIBC/Ferritin/ %Sat    Component Value Date/Time   IRON 48 08/30/2020 1052   TIBC 391 08/30/2020 1052   FERRITIN 25 08/30/2020 1052   IRONPCTSAT 12 (L) 08/30/2020 1052      RADIOGRAPHIC STUDIES: I have personally reviewed the radiological images as listed and agreed with the findings in the report. CT CHEST WO CONTRAST  Result Date: 05/10/2021 CLINICAL DATA:  Non-small cell lung cancer for routine follow-up. 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 EXAM: CT CHEST WITHOUT CONTRAST TECHNIQUE: Multidetector CT imaging of the chest was performed following the standard protocol without IV contrast. COMPARISON:  CT chest 03/28/2021 and 12/25/2020.  PET-CT 09/18/2020. FINDINGS: Cardiovascular: Right  IJ Port-A-Cath extends to the superior cavoatrial junction. Mild coronary artery atherosclerosis. No significant vascular findings on noncontrast  imaging. The heart size is normal. There is no pericardial effusion. Mediastinum/Nodes: There are no enlarged mediastinal, hilar or axillary lymph nodes.Stable 7 mm right paratracheal node on image 61/2. There is new thickening of the posterior wall of the right upper lobe bronchus, measuring up to 1.2 cm on image 62/2. Right hilar store shin and peribronchial opacities are otherwise similar, attributed to prior radiation therapy. The thyroid gland, trachea and esophagus demonstrate no significant findings. Lungs/Pleura: There is no pleural effusion or pneumothorax. Evolving radiation changes in the right perihilar region with some interval contraction of the overall process. Patchy ground-glass opacities more peripherally in the right lung have generally improved in the interval, with the exception of a single right upper lobe component measuring 10 mm on image 45/3 which appears new. There are no new or enlarging solid pulmonary nodules. The left lung appears clear. Upper abdomen: The visualized upper abdomen appears stable without significant findings. Musculoskeletal/Chest wall: There is no chest wall mass or suspicious osseous finding. Status post lower cervical fusion. IMPRESSION: 1. Evolving radiation changes in the right perihilar region, likely accounting for interval increased thickening of the posterior wall of the right upper lobe bronchus. 2. The more peripheral right lung ground-glass opacities seen on the most recent study have generally improved with exception of one peripherally in the right upper lobe which appears new. Again, these are likely inflammatory, potentially drug reaction. Continued follow-up recommended. 3. No findings highly suspicious for local recurrence or metastatic disease. Electronically Signed   By: Richardean Sale M.D.   On:  05/10/2021 21:52   CT Chest Wo Contrast  Result Date: 03/29/2021 CLINICAL DATA:  Lung cancer follow-up EXAM: CT CHEST WITHOUT CONTRAST TECHNIQUE: Multidetector CT imaging of the chest was performed following the standard protocol without IV contrast. COMPARISON:  Chest CT dated July 27, 2020 FINDINGS: Cardiovascular: Normal heart size. Trace pericardial fluid. Calcifications of the RCA and LAD. Right chest wall port with tip position in the upper right atrium minimal calcified plaque of the thoracic aorta. Mediastinum/Nodes: Unchanged size of mediastinal lymph nodes. Stable right upper paratracheal lymph node measuring 8 mm on series 2, image 39 stable right lower paratracheal lymph node measuring 1.0 cm on image 68. Similar soft tissue stranding of the right mediastinum and hilum, likely postradiation change. Esophagus and thyroid are unremarkable. Lungs/Pleura: Central airways are patent. New linear right perihilar consolidation opacity with associated traction bronchiectasis and surrounding ground-glass.Additional peribronchovascular and nodular ground-glass opacities with areas of consolidation are seen primarily in the right upper and lower lobes which are more distant from the expected radiation field. Reference ground-glass opacity of the right lower lobe measuring up to 1.5 cm on series 3, image 98. Upper Abdomen: No acute abnormality. Musculoskeletal: No chest wall mass or suspicious bone lesions identified. IMPRESSION: New linear right perihilar consolidation with associated traction bronchiectasis, likely postradiation change. Additional new peribronchovascular and nodular ground-glass opacities with areas of consolidation are seen primarily in the right upper and lower lobes which are more distant from the expected radiation field, findings are possibly due to radiation induced organizing pneumonia or superimposed drug toxicity. Recommend follow-up chest CT in 1-2 months to assess evolution.  Unchanged size of mediastinal lymph nodes. Aortic Atherosclerosis (ICD10-I70.0) and Emphysema (ICD10-J43.9). Electronically Signed   By: Yetta Glassman M.D.   On: 03/29/2021 11:39   CARDIAC CATHETERIZATION  Result Date: 05/16/2021   The left ventricular systolic function is normal.   The left ventricular ejection fraction is 55-65% by visual estimate.  There is no aortic valve stenosis.   No angiographically apparent coronary artery disease Conclusion: No angiographically apparent coronary artery disease Normal LVEDP Normal LV systolic function Recommendations: Resume eliquis tomorrow morning Continue plavix for PAD indication Ongoing aggressive secondary prevention Follow up with Dr. Clayborn Bigness in 2 weeks       ASSESSMENT & PLAN:  1. Squamous cell carcinoma of right lung ([place])   2. Encounter for antineoplastic immunotherapy   3. Hypothyroidism due to medication   4. Dysphagia, unspecified type   5. COVID-19    Cancer Staging  Squamous cell carcinoma of right lung [facility]) Staging form: Lung, AJCC 8th Edition - Clinical stage from 09/20/2020: Stage IIIA (cT1b, cN2, cM0) - Signed by [name], MD on 09/20/2020   #Squamous cell carcinoma of right lung, stage IIIA, Labs are reviewed and discussed with patient. He is covid 19 positive.  Hold off treatment today.    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   Continue Synthroid 39mcg.   # COVID 19 positive. He has mild symptoms. Recommend Paxlovid. Rationale and side effects were discussed. He declined.  I ask him to call office if symptoms get worse.   # Dysphagia, and due for colonoscopy. Discussed about GI work up.  He agrees. Will refer him to GI.                                                          All questions were answered. The patient knows to call the clinic with any problems questions or concerns.  cc [name], MD   Return of visit: Lab MD durvalumab in 2 weeks-  [name], MD, PhD  06/20/2021

## 2021-06-20 NOTE — Telephone Encounter (Signed)
LVM in regards to Covid test, patient is currently positive for Covid and would like to know if he is symptomatic.    PFT will need to be cancelled. Sending to [place] as an [place].

## 2021-06-20 NOTE — Telephone Encounter (Signed)
Called and spoke to patient about Covid positive results. Patient states he is not experiencing any symptoms currently. Explained that he will need to quarantine for 5 days. Patient had a clear understanding. Nothing further needed.

## 2021-06-20 NOTE — Progress Notes (Signed)
Patient tested positive for Covid on 06/19/2021. Dr. Tasia Catchings made aware and per Dr. Tasia Catchings, hold treatment today. Patient made aware and educated on s/s as to when to seek treatment. Patient advised to contact the clinic should he need anything. Patient confirms understanding and denies any further questions or concerns.

## 2021-07-01 ENCOUNTER — Other Ambulatory Visit: Payer: Self-pay | Admitting: Oncology

## 2021-07-02 ENCOUNTER — Encounter: Payer: Self-pay | Admitting: Oncology

## 2021-07-02 MED ORDER — LEVOTHYROXINE SODIUM 75 MCG PO TABS: 75.0000 ug | ORAL_TABLET | Freq: Every day | ORAL | 0 refills | Status: DC

## 2021-07-04 ENCOUNTER — Encounter: Payer: Self-pay | Admitting: Oncology

## 2021-07-04 ENCOUNTER — Other Ambulatory Visit: Payer: Self-pay

## 2021-07-04 ENCOUNTER — Inpatient Hospital Stay: Payer: Medicare Other

## 2021-07-04 ENCOUNTER — Inpatient Hospital Stay (HOSPITAL_BASED_OUTPATIENT_CLINIC_OR_DEPARTMENT_OTHER): Payer: Medicare Other | Admitting: Oncology

## 2021-07-04 VITALS — BP 130/83 | HR 90 | Temp 97.9°F | Resp 18 | Wt 202.2 lb

## 2021-07-04 DIAGNOSIS — E032 Hypothyroidism due to medicaments and other exogenous substances: Secondary | ICD-10-CM | POA: Diagnosis not present

## 2021-07-04 DIAGNOSIS — R1310 Dysphagia, unspecified: Secondary | ICD-10-CM

## 2021-07-04 DIAGNOSIS — C3491 Malignant neoplasm of unspecified part of right bronchus or lung: Secondary | ICD-10-CM

## 2021-07-04 DIAGNOSIS — Z5112 Encounter for antineoplastic immunotherapy: Secondary | ICD-10-CM | POA: Diagnosis not present

## 2021-07-04 LAB — CBC WITH DIFFERENTIAL/PLATELET
Abs Immature Granulocytes: 0.03 10*3/uL (ref 0.00–0.07)
Basophils Absolute: 0.1 10*3/uL (ref 0.0–0.1)
Basophils Relative: 1 %
Eosinophils Absolute: 0.1 10*3/uL (ref 0.0–0.5)
Eosinophils Relative: 2 %
HCT: 39.5 % (ref 39.0–52.0)
Hemoglobin: 12.7 g/dL — ABNORMAL LOW (ref 13.0–17.0)
Immature Granulocytes: 0 %
Lymphocytes Relative: 15 %
Lymphs Abs: 1 10*3/uL (ref 0.7–4.0)
MCH: 28.5 pg (ref 26.0–34.0)
MCHC: 32.2 g/dL (ref 30.0–36.0)
MCV: 88.6 fL (ref 80.0–100.0)
Monocytes Absolute: 0.7 10*3/uL (ref 0.1–1.0)
Monocytes Relative: 10 %
Neutro Abs: 4.9 10*3/uL (ref 1.7–7.7)
Neutrophils Relative %: 72 %
Platelets: 233 10*3/uL (ref 150–400)
RBC: 4.46 MIL/uL (ref 4.22–5.81)
RDW: 13.8 % (ref 11.5–15.5)
WBC: 6.9 10*3/uL (ref 4.0–10.5)
nRBC: 0 % (ref 0.0–0.2)

## 2021-07-04 LAB — COMPREHENSIVE METABOLIC PANEL
ALT: 14 U/L (ref 0–44)
AST: 21 U/L (ref 15–41)
Albumin: 4.1 g/dL (ref 3.5–5.0)
Alkaline Phosphatase: 72 U/L (ref 38–126)
Anion gap: 10 (ref 5–15)
BUN: 25 mg/dL — ABNORMAL HIGH (ref 8–23)
CO2: 25 mmol/L (ref 22–32)
Calcium: 8.9 mg/dL (ref 8.9–10.3)
Chloride: 104 mmol/L (ref 98–111)
Creatinine, Ser: 1.45 mg/dL — ABNORMAL HIGH (ref 0.61–1.24)
GFR, Estimated: 54 mL/min — ABNORMAL LOW (ref >60)
Glucose, Bld: 131 mg/dL — ABNORMAL HIGH (ref 70–99)
Potassium: 3.9 mmol/L (ref 3.5–5.1)
Sodium: 139 mmol/L (ref 135–145)
Total Bilirubin: 0.4 mg/dL (ref 0.3–1.2)
Total Protein: 7.6 g/dL (ref 6.5–8.1)

## 2021-07-04 MED ORDER — HEPARIN SOD (PORK) LOCK FLUSH 100 UNIT/ML IV SOLN
500.0000 [IU] | Freq: Once | INTRAVENOUS | Status: AC
  Filled 2021-07-04: qty 5

## 2021-07-04 MED ORDER — SODIUM CHLORIDE 0.9 % IV SOLN
Freq: Once | INTRAVENOUS | Status: AC
  Filled 2021-07-04: qty 250

## 2021-07-04 MED ORDER — SODIUM CHLORIDE 0.9 % IV SOLN
10.0000 mg/kg | Freq: Once | INTRAVENOUS | Status: AC
  Administered 2021-07-04: 860 mg via INTRAVENOUS
  Filled 2021-07-04: qty 10

## 2021-07-04 MED ORDER — SODIUM CHLORIDE 0.9% FLUSH
10.0000 mL | INTRAVENOUS | Status: DC | PRN
  Filled 2021-07-04: qty 10

## 2021-07-04 MED ORDER — SODIUM CHLORIDE 0.9 % IV SOLN
INTRAVENOUS | Status: DC
  Filled 2021-07-04 (×2): qty 250

## 2021-07-04 MED ORDER — HEPARIN SOD (PORK) LOCK FLUSH 100 UNIT/ML IV SOLN
500.0000 [IU] | Freq: Once | INTRAVENOUS | Status: AC | PRN
  Administered 2021-07-04: 500 [IU]
  Filled 2021-07-04: qty 5

## 2021-07-04 MED ORDER — SODIUM CHLORIDE 0.9% FLUSH
10.0000 mL | Freq: Once | INTRAVENOUS | Status: AC
  Administered 2021-07-04: 10 mL via INTRAVENOUS
  Filled 2021-07-04: qty 10

## 2021-07-04 NOTE — Progress Notes (Signed)
Hematology/Oncology Progress note  Patient Care Team: [name], MD as PCP - General (Physician Assistant) [name], MD as Referring Physician (Physician Assistant) Bary Castilla, [name], MD (General Surgery) Telford Nab, RN as Oncology Nurse Navigator  REFERRING PROVIDER: [name], MD  CHIEF COMPLAINTS/REASON FOR VISIT:  Follow up for lung cancer  HISTORY OF PRESENTING ILLNESS:   Joshua [name] is a  65 y.o.  male with PMH listed below was seen in consultation at the request of  [name], MD  for evaluation of lung mass  Patient presented to emergency room on 08/25/2020 for evaluation of hemoptysis, coughing.  He coughed up small amount of blood and a large clot.  No fever, chills, chest pain, abdominal pain.  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  Patient is on Plavix and Eliquis.  Since his ER visit, he has not coughed up more blood.  08/25/2020 CT angio chest PE with and without contrast showed ill-defined soft tissue adjacent to the right upper lobe bronchus resulting in narrowing of right upper lobe bronchus.  Also right paratracheal mediastinal adenopathy.  Scattered centrilobular groundglass opacities within the right lower lobe and to a lesser extent within the right lower lobe likely reflecting an infectious or inflammatory process. # 09/14/2020, right upper lobe lung biopsy and bronchoscopy brushing are both positive for squamous cell carcinoma.Right precarinal space lymph node insufficient lymph node sampling for staging. 09/18/2020, PET scan showed right upper lobe nodule maximal SUV 11.5.  Hypermetabolic cluster right paratracheal adenopathy with maximum SUV up to 12.  No findings of metastatic disease to the neck/abdomen/pelvis or skeleton.  # 10/03/2000, MRI brain with and without contrast showed no  evidence of intracranial metastatic disease. 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  # - NGS showed PD-L1 TPS 95%, LRP1B S953f, PIK3CA E545K, RB1 c1422-2A>T, RIT1 T83_ A84del, STK11 P2872f TPS53 R248W, TMB 19.27m22mmb, MS stable.   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  Management per ENT   #10/10/2020 started concurrent chemoradiation.  Patient received 4 cycles of carboplatin AUC of 2 and Taxol 45 mg per metered square  Additional chemotherapy was held due to thrombocytopenia, neutropenia Patient finished radiation on 11/27/2020  #12/31/2020 -03/21/2021, Durvalumab maintenance  03/28/2021, CT without contrast showed new linear right perihilar consolidation with associated traction bronchiectasis.  Likely postradiation changes.  Additional new peribronchial vascular/nodular groundglass opacities with areas of consolidation or seen primarily in the right upper and lower lobes, more distant from expected radiation fields.  Findings are possibly due to radiation-induced organizing pneumonia.  Or superimposed drug toxicity.  Recommend follow-up CT chest in 1 to 2 months for assessment of evaluation.  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  Patient was seen by pulmonology Dr. GonPatsey Bertholder my discussion with pulmonology Dr. 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  Recent CT showed improvement.  05/09/2021, repeat CT chest without contrast showed evolving radiation changes in the right perihilar region likely accounting for interval increase to thickening of the posterior wall of the right upper lobe bronchus.  The more peripheral right lung groundglass opacities seen on most recent study have generally improved with exception of one peripherally right upper lobe which appears new.  These are likely inflammatory.  No findings highly suspicious  for local  05/30/2021 resumed on Durvalumab.    INTERVAL HISTORY Joshua [name] is a 65 y.o. male who has above history reviewed by me today presents for follow up visit for Stage III lung squamous cell carcinoma. Patient reports feeling well.  he denies any symptoms when he had COVID-19  Review of Systems  Constitutional:  Negative for appetite change, chills, fatigue, fever and unexpected weight change.  HENT:   Negative for hearing loss and voice change.   Eyes:  Negative for eye problems and icterus.  Respiratory:  Negative for chest tightness, cough, hemoptysis and shortness of breath.   Cardiovascular:  Negative for chest pain and leg swelling.  Gastrointestinal:  Negative for abdominal distention and abdominal pain.       Swallowing difficulty, food stuck sensation.   Endocrine: Negative for hot flashes.  Genitourinary:  Negative for difficulty urinating, dysuria and frequency.   Musculoskeletal:  Negative for arthralgias.  Skin:  Negative for itching and rash.  Neurological:  Negative for light-headedness and numbness.  Hematological:  Negative for adenopathy. Does not bruise/bleed easily.  Psychiatric/Behavioral:  Negative for confusion.    MEDICAL HISTORY:  Past Medical History:  Diagnosis Date   Allergy    Cancer ([place])    lung   Dyspnea    former smoker. only doe   History of kidney stones    Hypertension    Peripheral vascular disease ([place])    Pre-diabetes    Sleep apnea     SURGICAL HISTORY: Past Surgical History:  Procedure Laterality Date   ANTERIOR CERVICAL DECOMP/DISCECTOMY FUSION N/A 03/14/2020   Procedure: ANTERIOR CERVICAL DECOMPRESSION/DISCECTOMY FUSION 3 LEVELS C4-7;  Surgeon: Meade Maw, MD;  Location: [facility];  Service: Neurosurgery;  Laterality: N/A;   BACK SURGERY  2011   CENTRAL LINE INSERTION Right 09/10/2016   Procedure: CENTRAL LINE INSERTION;  Surgeon: Katha Cabal, MD;  Location: [facility];  Service: Vascular;   Laterality: Right;   CERVICAL FUSION     C4-c7 on Mar 14, 2020   COLONOSCOPY  2013 ?   CORONARY ANGIOPLASTY     FEMORAL-POPLITEAL BYPASS GRAFT Right 09/10/2016   Procedure: BYPASS GRAFT FEMORAL-POPLITEAL ARTERY ( BELOW KNEE );  Surgeon: Katha Cabal, MD;  Location: [facility];  Service: Vascular;  Laterality: Right;   FEMORAL-TIBIAL BYPASS GRAFT Right 03/23/2020   Procedure: BYPASS GRAFT RIGHT FEMORAL- DISTAL TIBIAL ARTERY WITH DISTAL FLOW GRAFT;  Surgeon: Katha Cabal, MD;  Location: [facility];  Service: Vascular;  Laterality: Right;   LEFT HEART CATH AND CORONARY ANGIOGRAPHY Left 05/16/2021   Procedure: LEFT HEART CATH AND CORONARY ANGIOGRAPHY;  Surgeon: Andrez Grime, MD;  Location: [place] [facility];  Service: Cardiovascular;  Laterality: Left;   LOWER EXTREMITY ANGIOGRAPHY Right 11/18/2016   Procedure: Lower Extremity Angiography;  Surgeon: Katha Cabal, MD;  Location: [place] [facility];  Service: Cardiovascular;  Laterality: Right;   LOWER EXTREMITY ANGIOGRAPHY Right 12/09/2016   Procedure: Lower Extremity Angiography;  Surgeon: Katha Cabal, MD;  Location: [place] [facility];  Service: Cardiovascular;  Laterality: Right;   LOWER EXTREMITY ANGIOGRAPHY Right 11/15/2019   Procedure: LOWER EXTREMITY ANGIOGRAPHY;  Surgeon: Katha Cabal, MD;  Location: [place] [facility];  Service: Cardiovascular;  Laterality: Right;   LOWER EXTREMITY ANGIOGRAPHY Right 12/01/2019   Procedure: Lower Extremity Angiography;  Surgeon: Algernon Huxley, MD;  Location: [place] [facility];  Service: Cardiovascular;  Laterality: Right;   LOWER EXTREMITY ANGIOGRAPHY Right 12/02/2019   Procedure: LOWER  EXTREMITY ANGIOGRAPHY;  Surgeon: Katha Cabal, MD;  Location: [place] [facility];  Service: Cardiovascular;  Laterality: Right;   LOWER EXTREMITY ANGIOGRAPHY Right 03/23/2020   Procedure: Lower Extremity Angiography;  Surgeon: Katha Cabal, MD;  Location: [place] [facility];   Service: Cardiovascular;  Laterality: Right;   LOWER EXTREMITY INTERVENTION  11/18/2016   Procedure: Lower Extremity Intervention;  Surgeon: Katha Cabal, MD;  Location: [place] [facility];  Service: Cardiovascular;;   PERIPHERAL VASCULAR CATHETERIZATION Right 01/02/2015   Procedure: Lower Extremity Angiography;  Surgeon: Katha Cabal, MD;  Location: [place] [facility];  Service: Cardiovascular;  Laterality: Right;   PERIPHERAL VASCULAR CATHETERIZATION Right 06/18/2015   Procedure: Lower Extremity Angiography;  Surgeon: Algernon Huxley, MD;  Location: [place] [facility];  Service: Cardiovascular;  Laterality: Right;   PERIPHERAL VASCULAR CATHETERIZATION  06/18/2015   Procedure: Lower Extremity Intervention;  Surgeon: Algernon Huxley, MD;  Location: [place] [facility];  Service: Cardiovascular;;   PERIPHERAL VASCULAR CATHETERIZATION N/A 10/09/2015   Procedure: Abdominal Aortogram w/Lower Extremity;  Surgeon: Katha Cabal, MD;  Location: [place] [facility];  Service: Cardiovascular;  Laterality: N/A;   PERIPHERAL VASCULAR CATHETERIZATION  10/09/2015   Procedure: Lower Extremity Intervention;  Surgeon: Katha Cabal, MD;  Location: [place] [facility];  Service: Cardiovascular;;   PERIPHERAL VASCULAR CATHETERIZATION Right 10/10/2015   Procedure: Lower Extremity Angiography;  Surgeon: Algernon Huxley, MD;  Location: [place] [facility];  Service: Cardiovascular;  Laterality: Right;   PERIPHERAL VASCULAR CATHETERIZATION Right 10/10/2015   Procedure: Lower Extremity Intervention;  Surgeon: Algernon Huxley, MD;  Location: [place] [facility];  Service: Cardiovascular;  Laterality: Right;   PERIPHERAL VASCULAR CATHETERIZATION Right 12/03/2015   Procedure: Lower Extremity Angiography;  Surgeon: Algernon Huxley, MD;  Location: [place] [facility];  Service: Cardiovascular;  Laterality: Right;   PERIPHERAL VASCULAR CATHETERIZATION Right 12/04/2015   Procedure: Lower Extremity Angiography;  Surgeon:  Algernon Huxley, MD;  Location: [place] [facility];  Service: Cardiovascular;  Laterality: Right;   PERIPHERAL VASCULAR CATHETERIZATION  12/04/2015   Procedure: Lower Extremity Intervention;  Surgeon: Algernon Huxley, MD;  Location: [place] [facility];  Service: Cardiovascular;;   PERIPHERAL VASCULAR CATHETERIZATION Right 06/30/2016   Procedure: Lower Extremity Angiography;  Surgeon: Algernon Huxley, MD;  Location: [place] [facility];  Service: Cardiovascular;  Laterality: Right;   PERIPHERAL VASCULAR CATHETERIZATION Right 06/25/2016   Procedure: Lower Extremity Angiography;  Surgeon: Katha Cabal, MD;  Location: [place] [facility];  Service: Cardiovascular;  Laterality: Right;   PERIPHERAL VASCULAR CATHETERIZATION Right 07/01/2016   Procedure: Lower Extremity Angiography;  Surgeon: Katha Cabal, MD;  Location: [place] [facility];  Service: Cardiovascular;  Laterality: Right;   PERIPHERAL VASCULAR CATHETERIZATION  07/01/2016   Procedure: Lower Extremity Intervention;  Surgeon: Katha Cabal, MD;  Location: [place] [facility];  Service: Cardiovascular;;   PERIPHERAL VASCULAR CATHETERIZATION Right 08/01/2016   Procedure: Lower Extremity Angiography;  Surgeon: Katha Cabal, MD;  Location: [place] [facility];  Service: Cardiovascular;  Laterality: Right;   PORTA CATH INSERTION N/A 10/05/2020   Procedure: PORTA CATH INSERTION;  Surgeon: Katha Cabal, MD;  Location: [place] [facility];  Service: Cardiovascular;  Laterality: N/A;   stent placement in right leg Right    VIDEO BRONCHOSCOPY N/A 09/14/2020   Procedure: VIDEO BRONCHOSCOPY WITH FLUORO;  Surgeon: Tyler Pita, MD;  Location: [facility];  Service:  Cardiopulmonary;  Laterality: N/A;   VIDEO BRONCHOSCOPY WITH ENDOBRONCHIAL ULTRASOUND N/A 09/14/2020   Procedure: VIDEO BRONCHOSCOPY WITH ENDOBRONCHIAL ULTRASOUND;  Surgeon: Salena Saner, MD;  Location: [facility];  Service: Cardiopulmonary;  Laterality: N/A;    SOCIAL  HISTORY: Social History   Socioeconomic History   Marital status: Single    Spouse name: Not on file   Number of children: Not on file   Years of education: Not on file   Highest education level: Not on file  Occupational History   Occupation: unemployed  Tobacco Use   Smoking status: Former    Packs/day: 2.00    Years: 38.00    Pack years: 76.00    Types: Cigarettes    Quit date: 12/20/2014    Years since quitting: 6.5   Smokeless tobacco: Former   Tobacco comments:    quit smoking in 2017 most smoked 2   Vaping Use   Vaping Use: Never used  Substance and Sexual Activity   Alcohol use: Not Currently   Drug use: No   Sexual activity: Yes  Other Topics Concern   Not on file  Social History Narrative   Not on file   Social Determinants of Health   Financial Resource Strain: Not on file  Food Insecurity: Not on file  Transportation Needs: Not on file  Physical Activity: Not on file  Stress: Not on file  Social Connections: Not on file  Intimate Partner Violence: Not on file    FAMILY HISTORY: Family History  Problem Relation Age of Onset   Diabetes Mother    Hypertension Mother    Heart murmur Mother    Leukemia Mother    Throat cancer Maternal Grandmother    Colon cancer Neg Hx    Breast cancer Neg Hx     ALLERGIES:  is allergic to tape.  MEDICATIONS:  Current Outpatient Medications  Medication Sig Dispense Refill   acetaminophen (TYLENOL) 325 MG tablet Take 650 mg by mouth every 6 (six) hours as needed for moderate pain.     clopidogrel (PLAVIX) 75 MG tablet TAKE 1 TABLET BY MOUTH  DAILY 90 tablet 3   ELIQUIS 5 MG TABS tablet TAKE 1 TABLET BY MOUTH  TWICE DAILY 180 tablet 3   gabapentin (NEURONTIN) 300 MG capsule Take 2 capsules (600 mg total) by mouth 3 (three) times daily. (Patient taking differently: Take 300-900 mg by mouth at bedtime as needed (pain).) 540 capsule 1   levothyroxine (SYNTHROID) 75 MCG tablet Take 1 tablet (75 mcg total) by mouth daily  before breakfast. 30 tablet 0   lidocaine-prilocaine (EMLA) cream Apply to affected area once (Patient taking differently: Apply 1 application topically daily as needed (port access).) 30 g 3   potassium chloride SA (KLOR-CON M) 20 MEQ tablet Take 1 tablet (20 mEq total) by mouth daily. 30 tablet 0   simvastatin (ZOCOR) 40 MG tablet Take 40 mg by mouth at bedtime.     No current facility-administered medications for this visit.     PHYSICAL EXAMINATION: ECOG PERFORMANCE STATUS: 1 - Symptomatic but completely ambulatory Vitals:   07/04/21 0855  BP: 130/83  Pulse: 90  Resp: 18  Temp: 97.9 F (36.6 C)  SpO2: 98%   Filed Weights   07/04/21 0855  Weight: 202 lb 3.2 oz (91.7 kg)    Physical Exam Constitutional:      General: He is not in acute distress.    Comments: Walks with a cane.   HENT:  Head: Normocephalic and atraumatic.  Eyes:     General: No scleral icterus. Cardiovascular:     Rate and Rhythm: Normal rate and regular rhythm.     Heart sounds: Normal heart sounds.  Pulmonary:     Effort: Pulmonary effort is normal. No respiratory distress.     Breath sounds: No wheezing.  Abdominal:     General: Bowel sounds are normal. There is no distension.     Palpations: Abdomen is soft.  Musculoskeletal:        General: No deformity. Normal range of motion.     Cervical back: Normal range of motion and neck supple.  Skin:    General: Skin is warm and dry.     Findings: No erythema or rash.  Neurological:     Mental Status: He is alert and oriented to person, place, and time. Mental status is at baseline.     Cranial Nerves: No cranial nerve deficit.     Coordination: Coordination normal.  Psychiatric:        Mood and Affect: Mood normal.    LABORATORY DATA:  I have reviewed the data as listed Lab Results  Component Value Date   WBC 6.9 07/04/2021   HGB 12.7 (L) 07/04/2021   HCT 39.5 07/04/2021   MCV 88.6 07/04/2021   PLT 233 07/04/2021   Recent Labs     05/30/21 0914 06/20/21 0833 07/04/21 0838  NA 139 140 139  K 3.7 3.2* 3.9  CL 104 103 104  CO2 $Re'26 27 25  'vIM$ GLUCOSE 127* 155* 131*  BUN 19 13 25*  CREATININE 1.25* 1.10 1.45*  CALCIUM 8.8* 8.6* 8.9  GFRNONAA >60 >60 54*  PROT 7.6 7.4 7.6  ALBUMIN 4.4 4.1 4.1  AST $Re'23 30 21  'VCW$ ALT $R'16 19 14  'kc$ ALKPHOS 75 67 72  BILITOT 0.4 0.4 0.4    Iron/TIBC/Ferritin/ %Sat    Component Value Date/Time   IRON 48 08/30/2020 1052   TIBC 391 08/30/2020 1052   FERRITIN 25 08/30/2020 1052   IRONPCTSAT 12 (L) 08/30/2020 1052      RADIOGRAPHIC STUDIES: I have personally reviewed the radiological images as listed and agreed with the findings in the report. CT CHEST WO CONTRAST  Result Date: 05/10/2021 CLINICAL DATA:  Non-small cell lung cancer for routine follow-up. 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 EXAM: CT CHEST WITHOUT CONTRAST TECHNIQUE: Multidetector CT imaging of the chest was performed following the standard protocol without IV contrast. COMPARISON:  CT chest 03/28/2021 and 12/25/2020.  PET-CT 09/18/2020. FINDINGS: Cardiovascular: Right IJ Port-A-Cath extends to the superior cavoatrial junction. Mild coronary artery atherosclerosis. No significant vascular findings on noncontrast imaging. The heart size is normal. There is no pericardial effusion. Mediastinum/Nodes: There are no enlarged mediastinal, hilar or axillary lymph nodes.Stable 7 mm right paratracheal node on image 61/2. There is new thickening of the posterior wall of the right upper lobe bronchus, measuring up to 1.2 cm on image 62/2. Right hilar store shin and peribronchial opacities are otherwise similar, attributed to prior radiation therapy. The thyroid gland, trachea and esophagus demonstrate no significant findings. Lungs/Pleura: There is no pleural effusion or pneumothorax. Evolving radiation changes in the right perihilar region with some interval contraction of the overall process.  Patchy ground-glass opacities more peripherally in the right lung have generally improved in the interval, with the exception of a single right upper lobe component measuring 10 mm on image 45/3 which appears new. There are no new  or enlarging solid pulmonary nodules. The left lung appears clear. Upper abdomen: The visualized upper abdomen appears stable without significant findings. Musculoskeletal/Chest wall: There is no chest wall mass or suspicious osseous finding. Status post lower cervical fusion. IMPRESSION: 1. Evolving radiation changes in the right perihilar region, likely accounting for interval increased thickening of the posterior wall of the right upper lobe bronchus. 2. The more peripheral right lung ground-glass opacities seen on the most recent study have generally improved with exception of one peripherally in the right upper lobe which appears new. Again, these are likely inflammatory, potentially drug reaction. Continued follow-up recommended. 3. No findings highly suspicious for local recurrence or metastatic disease. Electronically Signed   By: Richardean Sale M.D.   On: 05/10/2021 21:52   CARDIAC CATHETERIZATION  Result Date: 05/16/2021   The left ventricular systolic function is normal.   The left ventricular ejection fraction is 55-65% by visual estimate.   There is no aortic valve stenosis.   No angiographically apparent coronary artery disease Conclusion: No angiographically apparent coronary artery disease Normal LVEDP Normal LV systolic function Recommendations: Resume eliquis tomorrow morning Continue plavix for PAD indication Ongoing aggressive secondary prevention Follow up with Dr. Clayborn Bigness in 2 weeks       ASSESSMENT & PLAN:  1. Squamous cell carcinoma of right lung ([place])   2. Encounter for antineoplastic immunotherapy   3. Hypothyroidism due to medication   4. Dysphagia, unspecified type    Cancer Staging  Squamous cell carcinoma of right lung [facility]) Staging form:  Lung, AJCC 8th Edition - Clinical stage from 09/20/2020: Stage IIIA (cT1b, cN2, cM0) - Signed by [name], MD on 09/20/2020   #Squamous cell carcinoma of right lung, stage IIIA, Labs are reviewed and discussed with patient. Proceed with Durvalumab today.   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   Continue Synthroid 19mcg.   # Dysphagia, and due for colonoscopy. Discussed about GI work up.  He agrees. Will refer him to GI.                                                          All questions were answered. The patient knows to call the clinic with any problems questions or concerns.  cc [name], MD   Return of visit: Lab MD durvalumab in 2 weeks-  [name], MD, PhD  07/04/2021

## 2021-07-04 NOTE — Patient Instructions (Signed)
[facility] CTR AT [place]  Discharge Instructions: Thank you for choosing [facility] to provide your oncology and hematology care.  If you have a lab appointment with the [facility], please go directly to the [facility] and check in at the registration area.  Wear comfortable clothing and clothing appropriate for easy access to any Portacath or PICC line.   We strive to give you quality time with your provider. You may need to reschedule your appointment if you arrive late (15 or more minutes).  Arriving late affects you and other patients whose appointments are after yours.  Also, if you miss three or more appointments without notifying the office, you may be dismissed from the clinic at the providers discretion.      For prescription refill requests, have your pharmacy contact our office and allow 72 hours for refills to be completed.    Today you received the following chemotherapy and/or immunotherapy agents Durvalumuab      To help prevent nausea and vomiting after your treatment, we encourage you to take your nausea medication as directed.  BELOW ARE SYMPTOMS THAT SHOULD BE REPORTED IMMEDIATELY: *FEVER GREATER THAN 100.4 F (38 C) OR HIGHER *CHILLS OR SWEATING *NAUSEA AND VOMITING THAT IS NOT CONTROLLED WITH YOUR NAUSEA MEDICATION *UNUSUAL SHORTNESS OF BREATH *UNUSUAL BRUISING OR BLEEDING *URINARY PROBLEMS (pain or burning when urinating, or frequent urination) *BOWEL PROBLEMS (unusual diarrhea, constipation, pain near the anus) TENDERNESS IN MOUTH AND THROAT WITH OR WITHOUT PRESENCE OF ULCERS (sore throat, sores in mouth, or a toothache) UNUSUAL RASH, SWELLING OR PAIN  UNUSUAL VAGINAL DISCHARGE OR ITCHING   Items with * indicate a potential emergency and should be followed up as soon as possible or go to the Emergency Department if any problems should occur.  Please show the CHEMOTHERAPY ALERT CARD or IMMUNOTHERAPY ALERT CARD at check-in to  the Emergency Department and triage nurse.  Should you have questions after your visit or need to cancel or reschedule your appointment, please contact [facility] CANCER [place] AT [place]  [phone] and follow the prompts.  Office hours are 8:00 a.m. to 4:30 p.m. Monday - Friday. Please note that voicemails left after 4:00 p.m. may not be returned until the following business day.  We are closed weekends and major holidays. You have access to a nurse at all times for urgent questions. Please call the main number to the clinic [phone] and follow the prompts.  For any non-urgent questions, you may also contact your provider using MyChart. We now offer e-Visits for anyone 2 and older to request care online for non-urgent symptoms. For details visit [email or web address].   Also download the MyChart app! Go to the app store, search "MyChart", open the app, select [facility], and log in with your MyChart username and password.  Due to Covid, a mask is required upon entering the hospital/clinic. If you do not have a mask, one will be given to you upon arrival. For doctor visits, patients may have 1 support person aged 65 or older with them. For treatment visits, patients cannot have anyone with them due to current Covid guidelines and our immunocompromised population.

## 2021-07-04 NOTE — Progress Notes (Signed)
Pt here for follow up. No new concerns voiced.   

## 2021-07-09 ENCOUNTER — Other Ambulatory Visit: Payer: Self-pay

## 2021-07-09 ENCOUNTER — Ambulatory Visit
Admission: RE | Admit: 2021-07-09 | Discharge: 2021-07-09 | Disposition: A | Discharge status: Home / Self Care | Payer: Medicare Other | Source: Ambulatory Visit | Attending: Unknown Physician Specialty | Admitting: Unknown Physician Specialty

## 2021-07-09 ENCOUNTER — Encounter: Payer: Self-pay | Admitting: Oncology

## 2021-07-09 DIAGNOSIS — R221 Localized swelling, mass and lump, neck: Secondary | ICD-10-CM

## 2021-07-09 MED ORDER — IOPAMIDOL (ISOVUE-300) INJECTION 61%
75.0000 mL | Freq: Once | INTRAVENOUS | Status: AC | PRN
  Administered 2021-07-09: 11:00:00 75 mL via INTRAVENOUS

## 2021-07-10 ENCOUNTER — Telehealth: Payer: Self-pay

## 2021-07-10 NOTE — Telephone Encounter (Signed)
Patient is aware of date/time of covid test. He voiced his understanding.  Nothing further needed.

## 2021-07-17 ENCOUNTER — Other Ambulatory Visit: Payer: Self-pay

## 2021-07-17 ENCOUNTER — Other Ambulatory Visit (INDEPENDENT_AMBULATORY_CARE_PROVIDER_SITE_OTHER): Payer: Self-pay | Admitting: Vascular Surgery

## 2021-07-17 ENCOUNTER — Other Ambulatory Visit
Admission: RE | Admit: 2021-07-17 | Discharge: 2021-07-17 | Disposition: A | Discharge status: Home / Self Care | Payer: Medicare Other | Source: Ambulatory Visit | Attending: Pulmonary Disease | Admitting: Pulmonary Disease

## 2021-07-17 DIAGNOSIS — Z01812 Encounter for preprocedural laboratory examination: Secondary | ICD-10-CM | POA: Insufficient documentation

## 2021-07-17 DIAGNOSIS — Z20822 Contact with and (suspected) exposure to covid-19: Secondary | ICD-10-CM

## 2021-07-17 DIAGNOSIS — U071 COVID-19: Secondary | ICD-10-CM | POA: Diagnosis not present

## 2021-07-17 DIAGNOSIS — I739 Peripheral vascular disease, unspecified: Secondary | ICD-10-CM

## 2021-07-17 LAB — SARS CORONAVIRUS 2 (TAT 6-24 HRS): SARS Coronavirus 2: POSITIVE — AB

## 2021-07-18 ENCOUNTER — Telehealth: Payer: Self-pay | Admitting: *Deleted

## 2021-07-18 ENCOUNTER — Encounter (INDEPENDENT_AMBULATORY_CARE_PROVIDER_SITE_OTHER): Payer: Self-pay

## 2021-07-18 ENCOUNTER — Ambulatory Visit: Payer: Medicare Other | Attending: Pulmonary Disease

## 2021-07-18 ENCOUNTER — Encounter (INDEPENDENT_AMBULATORY_CARE_PROVIDER_SITE_OTHER): Payer: Medicare Other

## 2021-07-18 ENCOUNTER — Ambulatory Visit: Payer: Medicare Other

## 2021-07-18 ENCOUNTER — Ambulatory Visit (INDEPENDENT_AMBULATORY_CARE_PROVIDER_SITE_OTHER): Payer: Medicare Other | Admitting: Vascular Surgery

## 2021-07-18 ENCOUNTER — Telehealth: Payer: Self-pay | Admitting: Oncology

## 2021-07-18 ENCOUNTER — Other Ambulatory Visit: Payer: Self-pay | Admitting: *Deleted

## 2021-07-18 DIAGNOSIS — J449 Chronic obstructive pulmonary disease, unspecified: Secondary | ICD-10-CM | POA: Insufficient documentation

## 2021-07-18 DIAGNOSIS — R0600 Dyspnea, unspecified: Secondary | ICD-10-CM | POA: Insufficient documentation

## 2021-07-18 DIAGNOSIS — R059 Cough, unspecified: Secondary | ICD-10-CM | POA: Insufficient documentation

## 2021-07-18 DIAGNOSIS — Z87891 Personal history of nicotine dependence: Secondary | ICD-10-CM | POA: Diagnosis not present

## 2021-07-18 MED ORDER — POTASSIUM CHLORIDE CRYS ER 20 MEQ PO TBCR: 20.0000 meq | EXTENDED_RELEASE_TABLET | Freq: Every day | ORAL | 1 refills | Status: DC

## 2021-07-18 MED ORDER — ALBUTEROL SULFATE (2.5 MG/3ML) 0.083% IN NEBU
2.5000 mg | INHALATION_SOLUTION | Freq: Once | RESPIRATORY_TRACT | Status: AC
  Administered 2021-07-18: 14:00:00 2.5 mg via RESPIRATORY_TRACT
  Filled 2021-07-18: qty 3

## 2021-07-18 NOTE — Telephone Encounter (Signed)
RN called and left message for patient regarding when his covid symptoms started and to please call cancer center to possibly make a virtual appointment for covid screening and treatment if patient had started having symptoms in 5 days or less.

## 2021-07-18 NOTE — Telephone Encounter (Signed)
Pt called in to report that he tested pos. For Covid. Needs to reschedule appt for 12-30. Call back at [phone]

## 2021-07-18 NOTE — Telephone Encounter (Signed)
RN spoke with patient who stated he had covid the end of November.  He states he was and is asymptomatic and has rescheduled cancer center appointments to January.

## 2021-07-18 NOTE — Telephone Encounter (Signed)
Component Ref Range & Units 2 wk ago  (07/04/21) 4 wk ago  (06/20/21) 1 mo ago  (05/30/21) 3 mo ago  (04/04/21) 3 mo ago  (03/21/21) 4 mo ago  (03/07/21) 4 mo ago  (02/21/21)  Potassium 3.5 - 5.1 mmol/L 3.9  3.2 Low   3.7  3.4 Low   3.6  3.5  3.8

## 2021-07-19 ENCOUNTER — Inpatient Hospital Stay: Payer: Medicare Other | Admitting: Oncology

## 2021-07-19 ENCOUNTER — Inpatient Hospital Stay: Payer: Medicare Other

## 2021-07-23 ENCOUNTER — Encounter: Payer: Self-pay | Admitting: Pulmonary Disease

## 2021-07-23 ENCOUNTER — Other Ambulatory Visit: Payer: Self-pay

## 2021-07-23 ENCOUNTER — Ambulatory Visit (INDEPENDENT_AMBULATORY_CARE_PROVIDER_SITE_OTHER): Payer: Medicare Other | Admitting: Pulmonary Disease

## 2021-07-23 VITALS — BP 140/86 | HR 84 | Temp 97.0°F | Ht 71.0 in | Wt 199.6 lb

## 2021-07-23 DIAGNOSIS — J449 Chronic obstructive pulmonary disease, unspecified: Secondary | ICD-10-CM | POA: Diagnosis not present

## 2021-07-23 DIAGNOSIS — C3491 Malignant neoplasm of unspecified part of right bronchus or lung: Secondary | ICD-10-CM | POA: Diagnosis not present

## 2021-07-23 DIAGNOSIS — J700 Acute pulmonary manifestations due to radiation: Secondary | ICD-10-CM | POA: Diagnosis not present

## 2021-07-23 MED ORDER — STIOLTO RESPIMAT 2.5-2.5 MCG/ACT IN AERS: 2.5000 ug | INHALATION_SPRAY | Freq: Two times a day (BID) | RESPIRATORY_TRACT | 0 refills | Status: DC

## 2021-07-23 NOTE — Progress Notes (Signed)
Subjective:    Patient ID: Joshua [name], male    DOB: 1955/09/25, 66 y.o.   MRN: [id number] Chief Complaint  Patient presents with   Follow-up    COPD-   HPI Patient is a 66 year old former smoker who presents for follow-up on the issue of shortness of breath with exertion.  This is a scheduled visit.  He has a 76-pack-year history of smoking.  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  He then received concomitant chemo radiation for his stage IIIa disease.  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  We had seen him in September due to concern that he would be having durvalumab lung toxicity but the pattern was more consistent with radiation pneumonitis.  This resolved without issue.  The patient was able to resume durvalumab in November without any side effect as of yet.  He continues to complain of shortness of breath on exertion but this has been a chronic issue for him.  He had PFTs finally performed on 29 December that showed a mixed pattern of restriction/obstruction the obstruction likely underestimated due to restriction.  Restriction is mild.  He does have significant small airways component.  Diffusion capacity was only mildly reduced.  He has been tried on triple therapy inhalers previously but has stopped using them after 2 to 3 days due to irritating side effects despite rinsing his mouth.  I suspect that this is due to the ICS component.  DATA 07/18/2021 PFTs: FEV1 2.80 L or 76% predicted, FVC 3.61 L or 74% predicted, there is a small airways component.  No significant bronchodilator response.  Lung volumes mildly reduced.  Diffusion capacity 71% by Kco.   Review of Systems A 10 point review of systems was performed and it is as noted above otherwise negative.  Past Medical History:  Diagnosis Date   Allergy     Cancer ([place])    lung   Dyspnea    former smoker. only doe   History of kidney stones    Hypertension    Peripheral vascular disease ([place])    Pre-diabetes    Sleep apnea    Social History   Tobacco Use   Smoking status: Former    Packs/day: 2.00    Years: 38.00    Pack years: 76.00    Types: Cigarettes    Quit date: 12/20/2014    Years since quitting: 6.5   Smokeless tobacco: Former   Tobacco comments:    quit smoking in 2017 most smoked 2   Substance Use Topics   Alcohol use: Not Currently   Current Meds  Medication Sig   acetaminophen (TYLENOL) 325 MG tablet Take 650 mg by mouth every 6 (six) hours as needed for moderate pain.   clopidogrel (PLAVIX) 75 MG tablet TAKE 1 TABLET BY MOUTH  DAILY   ELIQUIS 5 MG TABS tablet TAKE 1 TABLET BY MOUTH  TWICE DAILY   gabapentin (NEURONTIN) 300 MG capsule Take 2 capsules (600 mg total) by mouth 3 (three) times daily. (Patient taking differently: Take 300-900 mg by mouth at bedtime as needed (pain).)   levothyroxine (SYNTHROID) 75 MCG tablet Take 1 tablet (75 mcg total) by mouth daily before breakfast.   lidocaine-prilocaine (EMLA) cream Apply to affected area once (Patient taking differently: Apply 1 application topically daily as needed (port access).)   potassium  chloride SA (KLOR-CON M) 20 MEQ tablet Take 1 tablet (20 mEq total) by mouth daily.   simvastatin (ZOCOR) 40 MG tablet Take 40 mg by mouth at bedtime.   Immunization History  Administered Date(s) Administered   Influenza,inj,Quad PF,6+ Mos 09/14/2016, 04/13/2019, 04/16/2020   Influenza-Unspecified 09/14/2016, 04/13/2019, 04/16/2020   PFIZER Comirnaty(Gray Top)Covid-19 Tri-Sucrose Vaccine 10/20/2019, 11/29/2019   PFIZER(Purple Top)SARS-COV-2 Vaccination 10/20/2019, 11/29/2019   Tdap 04/16/2020      Objective:   Physical Exam BP 140/86 (BP Location: Left Arm, Patient Position: Sitting, Cuff Size: Normal)    Pulse 84    Temp (!) 97 F (36.1 C) (Oral)    Ht _0  (1.803  m)    Wt 199 lb 9.6 oz (90.5 kg)    SpO2 100%    BMI 27.84 kg/m  GENERAL: Chronically ill-appearing well-developed well-nourished gentleman, no acute distress. Walks with assistance of a cane. HEAD: Normocephalic, atraumatic.  EYES: Pupils equal, round, reactive to light.  No scleral icterus.  MOUTH: Nose/mouth/throat not examined due to masking requirements for COVID 19. NECK: Supple.  Limited ROM due to prior spinal fusion.  No thyromegaly. Trachea midline. No JVD.  No adenopathy. PULMONARY: Good air entry bilaterally.  Few rhonchi, no wheezes. CARDIOVASCULAR: S1 and S2. Regular rate and rhythm.  No rubs, murmurs or gallops heard. ABDOMEN: Benign. MUSCULOSKELETAL: No joint deformity, no clubbing, no edema.  NEUROLOGIC: No overt focal deficit, walks with assistance of a cane, speech is fluent. SKIN: Intact,warm,dry. PSYCH: Mood and behavior normal.     Assessment & Plan:     ICD-10-CM   1. COPD with chronic bronchitis and emphysema (HCC)  J44.9    Trial of Stiolto 2 inhalations once a day Patient taught proper use of the inhaler and was provided samples    2. Squamous cell carcinoma of right lung (HCC)  C34.91    This issue adds complexity to his management Stage IIIa, PD-L1 positive On durvalumab maintenance    3. Radiation pneumonitis (HCC) - Grade I-II  J70.0    Resolved, no evidence of sequela     Meds ordered this encounter  Medications   Tiotropium Bromide-Olodaterol (STIOLTO RESPIMAT) 2.5-2.5 MCG/ACT AERS    Sig: Inhale 2.5 mcg into the lungs 2 (two) times daily.    Dispense:  4 g    Refill:  0    Order Specific Question:   Lot Number?    Answer:   [id number]    Order Specific Question:   Expiration Date?    Answer:   01/17/2021    Order Specific Question:   Quantity    Answer:   3   We are giving the patient a trial of Stiolto to see if this will help with his issues with shortness of breath.  He has not tolerated triple therapy due to irritating side effects so will  limit to LABA/LAMA combination.  We will see him in follow-up in 6 to 8 weeks time he is to call sooner should any new problems arise.  He is to let us know how the [name] is working for him so we can call a prescription for him to his preferred pharmacy.  Renold Don, MD Advanced Bronchoscopy PCCM [facility]    *This note was dictated using voice recognition software/Dragon.  Despite best efforts to proofread, errors can occur which can change the meaning. Any transcriptional errors that result from this process are unintentional and may not be fully corrected at the time of dictation.

## 2021-07-23 NOTE — Patient Instructions (Signed)
We are giving you a trial of Stiolto which is an inhaler that you use 2 puffs once a day.  Rinse your mouth well after you use it.  You should however not have the irritating effects from other inhalers as it does not have the component that causes irritation.   We will see him in follow-up in 6 to 8 weeks time call sooner should any new problems arise.  Let us know how you do with the Stiolto so we can call it into your pharmacy.  You should have enough samples to last you for at least 1 month.

## 2021-07-24 ENCOUNTER — Encounter: Payer: Self-pay | Admitting: Pulmonary Disease

## 2021-07-29 ENCOUNTER — Other Ambulatory Visit: Payer: Medicare Other

## 2021-07-30 ENCOUNTER — Other Ambulatory Visit: Payer: Self-pay | Admitting: Unknown Physician Specialty

## 2021-07-30 ENCOUNTER — Other Ambulatory Visit: Payer: Self-pay | Admitting: Oncology

## 2021-07-30 DIAGNOSIS — R221 Localized swelling, mass and lump, neck: Secondary | ICD-10-CM

## 2021-07-30 NOTE — Telephone Encounter (Signed)
T4 Order: [id number] Status: Final result    Visible to patient: Yes (seen)    Next appt: 08/02/2021 at 08:15 AM in Oncology ([facility])    Dx: Squamous cell carcinoma of right lung...    0 Result Notes Component Ref Range & Units 2 mo ago 4 mo ago 5 mo ago 6 mo ago 7 mo ago  T4, Total 4.5 - 12.0 ug/dL 4.8  2.3 Low  CM  1.8 Low  CM  5.2 CM  15.1 High  CM   Comment: (NOTE)  Performed At: [facility]  [address] [place], [place] [id number]  Rush Farmer MD CZ:[id number]   Resulting Agency  [place] [facility] [place] [facility] [place] [facility] [place] [facility]         Specimen Collected: 05/30/21 09:14 Last Resulted: 05/31/21 14:36      Lab Flowsheet    Order Details    View Encounter    Lab and Collection Details    Routing    Result History    View Encounter Conversation      CM=Additional comments      Result Care Coordination   Patient Communication   Add Comments   Seen Back to Top       Other Results from 05/30/2021   Contains abnormal data TSH Order: [id number] Status: Final result    Visible to patient: Yes (seen)    Next appt: 08/02/2021 at 08:15 AM in Oncology ([facility])    Dx: Squamous cell carcinoma of right lung...    1 Result Note   1 Follow-up Encounter Component Ref Range & Units 2 mo ago  (05/30/21) 4 mo ago  (03/07/21) 5 mo ago  (02/21/21) 5 mo ago  (02/07/21) 6 mo ago  (01/24/21) 7 mo ago  (12/31/20)  TSH 0.350 - 4.500 uIU/mL 190.000 High   232.679 High  CM  261.000 High  CM  228.000 High  CM  3.618 CM  0.348 Low  CM   Comment: Performed by a 3rd Generation assay with a functional sensitivity of <=0.01 uIU/mL.  Performed at [facility], [address] [place], [place] [address]   Resulting Agency  [facility]         Specimen Collected: 05/30/21 09:14 Last Resulted: 05/30/21 11:04

## 2021-08-02 ENCOUNTER — Other Ambulatory Visit: Payer: Self-pay

## 2021-08-02 ENCOUNTER — Inpatient Hospital Stay: Payer: Medicare Other

## 2021-08-02 ENCOUNTER — Inpatient Hospital Stay: Payer: Medicare Other | Admitting: Oncology

## 2021-08-02 ENCOUNTER — Inpatient Hospital Stay: Payer: Medicare Other | Attending: Oncology

## 2021-08-02 ENCOUNTER — Encounter: Payer: Self-pay | Admitting: Oncology

## 2021-08-02 VITALS — BP 148/81 | HR 83 | Temp 97.2°F | Wt 202.3 lb

## 2021-08-02 DIAGNOSIS — C3491 Malignant neoplasm of unspecified part of right bronchus or lung: Secondary | ICD-10-CM | POA: Diagnosis not present

## 2021-08-02 DIAGNOSIS — C3411 Malignant neoplasm of upper lobe, right bronchus or lung: Secondary | ICD-10-CM | POA: Insufficient documentation

## 2021-08-02 DIAGNOSIS — E032 Hypothyroidism due to medicaments and other exogenous substances: Secondary | ICD-10-CM | POA: Diagnosis not present

## 2021-08-02 DIAGNOSIS — Z5112 Encounter for antineoplastic immunotherapy: Secondary | ICD-10-CM | POA: Diagnosis not present

## 2021-08-02 LAB — COMPREHENSIVE METABOLIC PANEL
ALT: 19 U/L (ref 0–44)
AST: 24 U/L (ref 15–41)
Albumin: 4.2 g/dL (ref 3.5–5.0)
Alkaline Phosphatase: 65 U/L (ref 38–126)
Anion gap: 6 (ref 5–15)
BUN: 24 mg/dL — ABNORMAL HIGH (ref 8–23)
CO2: 26 mmol/L (ref 22–32)
Calcium: 8.5 mg/dL — ABNORMAL LOW (ref 8.9–10.3)
Chloride: 106 mmol/L (ref 98–111)
Creatinine, Ser: 1.22 mg/dL (ref 0.61–1.24)
GFR, Estimated: >60 mL/min (ref >60)
Glucose, Bld: 117 mg/dL — ABNORMAL HIGH (ref 70–99)
Potassium: 3.8 mmol/L (ref 3.5–5.1)
Sodium: 138 mmol/L (ref 135–145)
Total Bilirubin: 0.3 mg/dL (ref 0.3–1.2)
Total Protein: 7 g/dL (ref 6.5–8.1)

## 2021-08-02 LAB — TSH: TSH: 84 u[IU]/mL — ABNORMAL HIGH (ref 0.350–4.500)

## 2021-08-02 LAB — CBC WITH DIFFERENTIAL/PLATELET
Abs Immature Granulocytes: 0.01 10*3/uL (ref 0.00–0.07)
Basophils Absolute: 0.1 10*3/uL (ref 0.0–0.1)
Basophils Relative: 1 %
Eosinophils Absolute: 0.2 10*3/uL (ref 0.0–0.5)
Eosinophils Relative: 3 %
HCT: 38 % — ABNORMAL LOW (ref 39.0–52.0)
Hemoglobin: 12.4 g/dL — ABNORMAL LOW (ref 13.0–17.0)
Immature Granulocytes: 0 %
Lymphocytes Relative: 18 %
Lymphs Abs: 0.9 10*3/uL (ref 0.7–4.0)
MCH: 29 pg (ref 26.0–34.0)
MCHC: 32.6 g/dL (ref 30.0–36.0)
MCV: 88.8 fL (ref 80.0–100.0)
Monocytes Absolute: 0.6 10*3/uL (ref 0.1–1.0)
Monocytes Relative: 12 %
Neutro Abs: 3.5 10*3/uL (ref 1.7–7.7)
Neutrophils Relative %: 66 %
Platelets: 171 10*3/uL (ref 150–400)
RBC: 4.28 MIL/uL (ref 4.22–5.81)
RDW: 14.1 % (ref 11.5–15.5)
WBC: 5.2 10*3/uL (ref 4.0–10.5)
nRBC: 0 % (ref 0.0–0.2)

## 2021-08-02 MED ORDER — SODIUM CHLORIDE 0.9 % IV SOLN
10.0000 mg/kg | Freq: Once | INTRAVENOUS | Status: AC
  Administered 2021-08-02: 860 mg via INTRAVENOUS
  Filled 2021-08-02: qty 10

## 2021-08-02 MED ORDER — SODIUM CHLORIDE 0.9 % IV SOLN
Freq: Once | INTRAVENOUS | Status: AC
  Filled 2021-08-02: qty 250

## 2021-08-02 MED ORDER — HEPARIN SOD (PORK) LOCK FLUSH 100 UNIT/ML IV SOLN
500.0000 [IU] | Freq: Once | INTRAVENOUS | Status: AC | PRN
  Administered 2021-08-02: 500 [IU]
  Filled 2021-08-02: qty 5

## 2021-08-02 NOTE — Progress Notes (Signed)
Hematology/Oncology Progress note  Patient Care Team: [name], MD as PCP - General (Physician Assistant) [name], MD as Referring Physician (Physician Assistant) Bary Castilla, [name], MD (General Surgery) Telford Nab, RN as Oncology Nurse Navigator  REFERRING PROVIDER: [name], MD  CHIEF COMPLAINTS/REASON FOR VISIT:  Follow up for lung cancer  HISTORY OF PRESENTING ILLNESS:   Joshua [name] is a  66 y.o.  male with PMH listed below was seen in consultation at the request of  [name], MD  for evaluation of lung mass  Patient presented to emergency room on 08/25/2020 for evaluation of hemoptysis, coughing.  He coughed up small amount of blood and a large clot.  No fever, chills, chest pain, abdominal pain.  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  Patient is on Plavix and Eliquis.  Since his ER visit, he has not coughed up more blood.  08/25/2020 CT angio chest PE with and without contrast showed ill-defined soft tissue adjacent to the right upper lobe bronchus resulting in narrowing of right upper lobe bronchus.  Also right paratracheal mediastinal adenopathy.  Scattered centrilobular groundglass opacities within the right lower lobe and to a lesser extent within the right lower lobe likely reflecting an infectious or inflammatory process. # 09/14/2020, right upper lobe lung biopsy and bronchoscopy brushing are both positive for squamous cell carcinoma.Right precarinal space lymph node insufficient lymph node sampling for staging. 09/18/2020, PET scan showed right upper lobe nodule maximal SUV 11.5.  Hypermetabolic cluster right paratracheal adenopathy with maximum SUV up to 12.  No findings of metastatic disease to the neck/abdomen/pelvis or skeleton.  # 10/03/2000, MRI brain with and without contrast showed no  evidence of intracranial metastatic disease. 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  # - NGS showed PD-L1 TPS 95%, LRP1B S953f, PIK3CA E545K, RB1 c1422-2A>T, RIT1 T83_ A84del, STK11 P2872f TPS53 R248W, TMB 19.27m22mmb, MS stable.   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  Management per ENT   #10/10/2020 started concurrent chemoradiation.  Patient received 4 cycles of carboplatin AUC of 2 and Taxol 45 mg per metered square  Additional chemotherapy was held due to thrombocytopenia, neutropenia Patient finished radiation on 11/27/2020  #12/31/2020 -03/21/2021, Durvalumab maintenance  03/28/2021, CT without contrast showed new linear right perihilar consolidation with associated traction bronchiectasis.  Likely postradiation changes.  Additional new peribronchial vascular/nodular groundglass opacities with areas of consolidation or seen primarily in the right upper and lower lobes, more distant from expected radiation fields.  Findings are possibly due to radiation-induced organizing pneumonia.  Or superimposed drug toxicity.  Recommend follow-up CT chest in 1 to 2 months for assessment of evaluation.  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  Patient was seen by pulmonology Dr. GonPatsey Bertholder my discussion with pulmonology Dr. 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  Recent CT showed improvement.  05/09/2021, repeat CT chest without contrast showed evolving radiation changes in the right perihilar region likely accounting for interval increase to thickening of the posterior wall of the right upper lobe bronchus.  The more peripheral right lung groundglass opacities seen on most recent study have generally improved with exception of one peripherally right upper lobe which appears new.  These are likely inflammatory.  No findings highly suspicious  for local  recurrence.  05/30/2021 resumed on Durvalumab.    INTERVAL HISTORY Joshua [name] is a 66 y.o. male who has above history reviewed by me today presents for follow up visit for Stage III lung squamous cell carcinoma. Patient reports feeling well.  Denies any nasal congestion, fever, chills, nausea vomiting diarrhea. Patient was seen by Dr. Patsey Berthold recently and is recommended to switch to  [facility] as he is not able to tolerate LABA/LAMA combination.  Review of Systems  Constitutional:  Negative for appetite change, chills, fatigue, fever and unexpected weight change.  HENT:   Negative for hearing loss and voice change.   Eyes:  Negative for eye problems and icterus.  Respiratory:  Negative for chest tightness, cough, hemoptysis and shortness of breath.   Cardiovascular:  Negative for chest pain and leg swelling.  Gastrointestinal:  Negative for abdominal distention and abdominal pain.  Endocrine: Negative for hot flashes.  Genitourinary:  Negative for difficulty urinating, dysuria and frequency.   Musculoskeletal:  Negative for arthralgias.  Skin:  Negative for itching and rash.  Neurological:  Negative for light-headedness and numbness.  Hematological:  Negative for adenopathy. Does not bruise/bleed easily.  Psychiatric/Behavioral:  Negative for confusion.    MEDICAL HISTORY:  Past Medical History:  Diagnosis Date   Allergy    Cancer ([place])    lung   Dyspnea    former smoker. only doe   History of kidney stones    Hypertension    Peripheral vascular disease ([place])    Pre-diabetes    Sleep apnea     SURGICAL HISTORY: Past Surgical History:  Procedure Laterality Date   ANTERIOR CERVICAL DECOMP/DISCECTOMY FUSION N/A 03/14/2020   Procedure: ANTERIOR CERVICAL DECOMPRESSION/DISCECTOMY FUSION 3 LEVELS C4-7;  Surgeon: Meade Maw, MD;  Location: [facility];  Service: Neurosurgery;  Laterality: N/A;   BACK SURGERY  2011   CENTRAL LINE INSERTION Right 09/10/2016    Procedure: CENTRAL LINE INSERTION;  Surgeon: Katha Cabal, MD;  Location: [facility];  Service: Vascular;  Laterality: Right;   CERVICAL FUSION     C4-c7 on Mar 14, 2020   COLONOSCOPY  2013 ?   CORONARY ANGIOPLASTY     FEMORAL-POPLITEAL BYPASS GRAFT Right 09/10/2016   Procedure: BYPASS GRAFT FEMORAL-POPLITEAL ARTERY ( BELOW KNEE );  Surgeon: Katha Cabal, MD;  Location: [facility];  Service: Vascular;  Laterality: Right;   FEMORAL-TIBIAL BYPASS GRAFT Right 03/23/2020   Procedure: BYPASS GRAFT RIGHT FEMORAL- DISTAL TIBIAL ARTERY WITH DISTAL FLOW GRAFT;  Surgeon: Katha Cabal, MD;  Location: [facility];  Service: Vascular;  Laterality: Right;   LEFT HEART CATH AND CORONARY ANGIOGRAPHY Left 05/16/2021   Procedure: LEFT HEART CATH AND CORONARY ANGIOGRAPHY;  Surgeon: Andrez Grime, MD;  Location: [place] [facility];  Service: Cardiovascular;  Laterality: Left;   LOWER EXTREMITY ANGIOGRAPHY Right 11/18/2016   Procedure: Lower Extremity Angiography;  Surgeon: Katha Cabal, MD;  Location: [place] [facility];  Service: Cardiovascular;  Laterality: Right;   LOWER EXTREMITY ANGIOGRAPHY Right 12/09/2016   Procedure: Lower Extremity Angiography;  Surgeon: Katha Cabal, MD;  Location: [place] [facility];  Service: Cardiovascular;  Laterality: Right;   LOWER EXTREMITY ANGIOGRAPHY Right 11/15/2019   Procedure: LOWER EXTREMITY ANGIOGRAPHY;  Surgeon: Katha Cabal, MD;  Location: [place] [facility];  Service: Cardiovascular;  Laterality: Right;   LOWER EXTREMITY ANGIOGRAPHY Right 12/01/2019   Procedure: Lower Extremity Angiography;  Surgeon: Algernon Huxley, MD;  Location: [place] [facility];  Service:  Cardiovascular;  Laterality: Right;   LOWER EXTREMITY ANGIOGRAPHY Right 12/02/2019   Procedure: LOWER EXTREMITY ANGIOGRAPHY;  Surgeon: Katha Cabal, MD;  Location: [place] [facility];  Service: Cardiovascular;  Laterality: Right;   LOWER EXTREMITY ANGIOGRAPHY Right 03/23/2020    Procedure: Lower Extremity Angiography;  Surgeon: Katha Cabal, MD;  Location: [place] [facility];  Service: Cardiovascular;  Laterality: Right;   LOWER EXTREMITY INTERVENTION  11/18/2016   Procedure: Lower Extremity Intervention;  Surgeon: Katha Cabal, MD;  Location: [place] [facility];  Service: Cardiovascular;;   PERIPHERAL VASCULAR CATHETERIZATION Right 01/02/2015   Procedure: Lower Extremity Angiography;  Surgeon: Katha Cabal, MD;  Location: [place] [facility];  Service: Cardiovascular;  Laterality: Right;   PERIPHERAL VASCULAR CATHETERIZATION Right 06/18/2015   Procedure: Lower Extremity Angiography;  Surgeon: Algernon Huxley, MD;  Location: [place] [facility];  Service: Cardiovascular;  Laterality: Right;   PERIPHERAL VASCULAR CATHETERIZATION  06/18/2015   Procedure: Lower Extremity Intervention;  Surgeon: Algernon Huxley, MD;  Location: [place] [facility];  Service: Cardiovascular;;   PERIPHERAL VASCULAR CATHETERIZATION N/A 10/09/2015   Procedure: Abdominal Aortogram w/Lower Extremity;  Surgeon: Katha Cabal, MD;  Location: [place] [facility];  Service: Cardiovascular;  Laterality: N/A;   PERIPHERAL VASCULAR CATHETERIZATION  10/09/2015   Procedure: Lower Extremity Intervention;  Surgeon: Katha Cabal, MD;  Location: [place] [facility];  Service: Cardiovascular;;   PERIPHERAL VASCULAR CATHETERIZATION Right 10/10/2015   Procedure: Lower Extremity Angiography;  Surgeon: Algernon Huxley, MD;  Location: [place] [facility];  Service: Cardiovascular;  Laterality: Right;   PERIPHERAL VASCULAR CATHETERIZATION Right 10/10/2015   Procedure: Lower Extremity Intervention;  Surgeon: Algernon Huxley, MD;  Location: [place] [facility];  Service: Cardiovascular;  Laterality: Right;   PERIPHERAL VASCULAR CATHETERIZATION Right 12/03/2015   Procedure: Lower Extremity Angiography;  Surgeon: Algernon Huxley, MD;  Location: [place] [facility];  Service: Cardiovascular;  Laterality: Right;    PERIPHERAL VASCULAR CATHETERIZATION Right 12/04/2015   Procedure: Lower Extremity Angiography;  Surgeon: Algernon Huxley, MD;  Location: [place] [facility];  Service: Cardiovascular;  Laterality: Right;   PERIPHERAL VASCULAR CATHETERIZATION  12/04/2015   Procedure: Lower Extremity Intervention;  Surgeon: Algernon Huxley, MD;  Location: [place] [facility];  Service: Cardiovascular;;   PERIPHERAL VASCULAR CATHETERIZATION Right 06/30/2016   Procedure: Lower Extremity Angiography;  Surgeon: Algernon Huxley, MD;  Location: [place] [facility];  Service: Cardiovascular;  Laterality: Right;   PERIPHERAL VASCULAR CATHETERIZATION Right 06/25/2016   Procedure: Lower Extremity Angiography;  Surgeon: Katha Cabal, MD;  Location: [place] [facility];  Service: Cardiovascular;  Laterality: Right;   PERIPHERAL VASCULAR CATHETERIZATION Right 07/01/2016   Procedure: Lower Extremity Angiography;  Surgeon: Katha Cabal, MD;  Location: [place] [facility];  Service: Cardiovascular;  Laterality: Right;   PERIPHERAL VASCULAR CATHETERIZATION  07/01/2016   Procedure: Lower Extremity Intervention;  Surgeon: Katha Cabal, MD;  Location: [place] [facility];  Service: Cardiovascular;;   PERIPHERAL VASCULAR CATHETERIZATION Right 08/01/2016   Procedure: Lower Extremity Angiography;  Surgeon: Katha Cabal, MD;  Location: [place] [facility];  Service: Cardiovascular;  Laterality: Right;   PORTA CATH INSERTION N/A 10/05/2020   Procedure: PORTA CATH INSERTION;  Surgeon: Katha Cabal, MD;  Location: [place] [facility];  Service: Cardiovascular;  Laterality: N/A;   stent placement in right leg Right    VIDEO BRONCHOSCOPY N/A 09/14/2020   Procedure: VIDEO  BRONCHOSCOPY WITH FLUORO;  Surgeon: Tyler Pita, MD;  Location: [facility];  Service: Cardiopulmonary;  Laterality: N/A;   VIDEO BRONCHOSCOPY WITH ENDOBRONCHIAL ULTRASOUND N/A 09/14/2020   Procedure: VIDEO BRONCHOSCOPY WITH ENDOBRONCHIAL ULTRASOUND;   Surgeon: Tyler Pita, MD;  Location: [facility];  Service: Cardiopulmonary;  Laterality: N/A;    SOCIAL HISTORY: Social History   Socioeconomic History   Marital status: Single    Spouse name: Not on file   Number of children: Not on file   Years of education: Not on file   Highest education level: Not on file  Occupational History   Occupation: unemployed  Tobacco Use   Smoking status: Former    Packs/day: 2.00    Years: 38.00    Pack years: 76.00    Types: Cigarettes    Quit date: 12/20/2014    Years since quitting: 6.6   Smokeless tobacco: Former   Tobacco comments:    quit smoking in 2017 most smoked 2   Vaping Use   Vaping Use: Never used  Substance and Sexual Activity   Alcohol use: Not Currently   Drug use: No   Sexual activity: Yes  Other Topics Concern   Not on file  Social History Narrative   Not on file   Social Determinants of Health   Financial Resource Strain: Not on file  Food Insecurity: Not on file  Transportation Needs: Not on file  Physical Activity: Not on file  Stress: Not on file  Social Connections: Not on file  Intimate Partner Violence: Not on file    FAMILY HISTORY: Family History  Problem Relation Age of Onset   Diabetes Mother    Hypertension Mother    Heart murmur Mother    Leukemia Mother    Throat cancer Maternal Grandmother    Colon cancer Neg Hx    Breast cancer Neg Hx     ALLERGIES:  is allergic to tape.  MEDICATIONS:  Current Outpatient Medications  Medication Sig Dispense Refill   acetaminophen (TYLENOL) 325 MG tablet Take 650 mg by mouth every 6 (six) hours as needed for moderate pain.     clopidogrel (PLAVIX) 75 MG tablet TAKE 1 TABLET BY MOUTH  DAILY 90 tablet 3   ELIQUIS 5 MG TABS tablet TAKE 1 TABLET BY MOUTH  TWICE DAILY 180 tablet 3   gabapentin (NEURONTIN) 300 MG capsule Take 2 capsules (600 mg total) by mouth 3 (three) times daily. (Patient taking differently: Take 300-900 mg by mouth at bedtime as  needed (pain).) 540 capsule 1   levothyroxine (SYNTHROID) 75 MCG tablet TAKE 1 TABLET BY MOUTH ONCE DAILY BEFORE BREAKFAST 30 tablet 0   lidocaine-prilocaine (EMLA) cream Apply to affected area once (Patient taking differently: Apply 1 application topically daily as needed (port access).) 30 g 3   potassium chloride SA (KLOR-CON M) 20 MEQ tablet Take 1 tablet (20 mEq total) by mouth daily. 30 tablet 1   simvastatin (ZOCOR) 40 MG tablet Take 40 mg by mouth at bedtime.     Tiotropium Bromide-Olodaterol (STIOLTO RESPIMAT) 2.5-2.5 MCG/ACT AERS Inhale 2.5 mcg into the lungs 2 (two) times daily. 4 g 0   No current facility-administered medications for this visit.     PHYSICAL EXAMINATION: ECOG PERFORMANCE STATUS: 1 - Symptomatic but completely ambulatory Vitals:   08/02/21 0839  BP: (!) 148/81  Pulse: 83  Temp: (!) 97.2 F (36.2 C)   Filed Weights   08/02/21 0839  Weight: 202 lb 4.8 oz (91.8 kg)  Physical Exam Constitutional:      General: He is not in acute distress.    Comments: Walks with a cane.   HENT:     Head: Normocephalic and atraumatic.  Eyes:     General: No scleral icterus. Cardiovascular:     Rate and Rhythm: Normal rate and regular rhythm.     Heart sounds: Normal heart sounds.  Pulmonary:     Effort: Pulmonary effort is normal. No respiratory distress.     Breath sounds: No wheezing.  Abdominal:     General: Bowel sounds are normal. There is no distension.     Palpations: Abdomen is soft.  Musculoskeletal:        General: No deformity. Normal range of motion.     Cervical back: Normal range of motion and neck supple.  Skin:    General: Skin is warm and dry.     Findings: No erythema or rash.  Neurological:     Mental Status: He is alert and oriented to person, place, and time. Mental status is at baseline.     Cranial Nerves: No cranial nerve deficit.     Coordination: Coordination normal.  Psychiatric:        Mood and Affect: Mood normal.     LABORATORY DATA:  I have reviewed the data as listed Lab Results  Component Value Date   WBC 5.2 08/02/2021   HGB 12.4 (L) 08/02/2021   HCT 38.0 (L) 08/02/2021   MCV 88.8 08/02/2021   PLT 171 08/02/2021   Recent Labs    06/20/21 0833 07/04/21 0838 08/02/21 0818  NA 140 139 138  K 3.2* 3.9 3.8  CL 103 104 106  CO2 _0 GLUCOSE 155* 131* 117*  BUN 13 25* 24*  CREATININE 1.10 1.45* 1.22  CALCIUM 8.6* 8.9 8.5*  GFRNONAA >60 54* >60  PROT 7.4 7.6 7.0  ALBUMIN 4.1 4.1 4.2  AST _1 ALT _2 ALKPHOS 67 72 65  BILITOT 0.4 0.4 0.3    Iron/TIBC/Ferritin/ %Sat    Component Value Date/Time   IRON 48 08/30/2020 1052   TIBC 391 08/30/2020 1052   FERRITIN 25 08/30/2020 1052   IRONPCTSAT 12 (L) 08/30/2020 1052      RADIOGRAPHIC STUDIES: I have personally reviewed the radiological images as listed and agreed with the findings in the report. CT SOFT TISSUE NECK W CONTRAST  Result Date: 07/10/2021 CLINICAL DATA:  66 year old male with history of non-small cell right upper lobe lung cancer. Right lung radiation. Left-side palpable mass. EXAM: CT NECK WITH CONTRAST TECHNIQUE: Multidetector CT imaging of the neck was performed using the standard protocol following the bolus administration of intravenous contrast. CONTRAST:  55m ISOVUE-300 IOPAMIDOL (ISOVUE-300) INJECTION 61% COMPARISON:  Cervical spine MRI 02/06/2020.  Chest CT 05/09/2021. FINDINGS: Pharynx and larynx: Negative larynx and pharynx. Parapharyngeal and retropharyngeal spaces are normal. Salivary glands: Negative sublingual space. Right side submandibular and parotid glands are within normal limits. Cystic mass with thin but irregular rim of soft tissue (series 6, image 39 and series 5, image 83) measures up to 29 mm diameter and is situated between the left submandibular and inferior parotid glands in the left neck on axial series 3, image 41. The lesion is inseparable from both glands, possibly with a  "claw sign" with the submandibular gland on sagittal image 81. But the underlying left side salivary gland parenchyma elsewhere is normal. No regional inflammation. This area is not clearly visible on  the 2019 cervical spine MRI. And there is no other neck cross-sectional imaging. Thyroid: Diminutive, negative. Lymph nodes: No solid cervical lymphadenopathy superimposed on the left side cystic mass detailed above. Bilateral cervical nodes are diminutive. Vascular: Right IJ Port-A-Cath is partially visible. Major vascular structures in the neck and at the skull base are patent. Left vertebral artery is dominant and the right is diminutive at the skull base. Limited intracranial: Negative. Visualized orbits: Not included. Mastoids and visualized paranasal sinuses: Visible sinuses are well aerated. There is mild right maxillary alveolar recess mucosal thickening. Visible tympanic cavities and mastoids are clear. Skeleton: Left mandible appears intact and normal near the cystic mass. Previous cervical ACDF C4 through C7 with evidence of solid arthrodesis. Other cervical spine degeneration. No acute or suspicious osseous lesion identified. Upper chest: Stable compared to the October chest CT. No superior mediastinal lymphadenopathy. IMPRESSION: 1. Left neck cystic mass with soft tissue rim is 2.9 cm and situated directly between the left submandibular and inferior parotid glands, inseparable from both. Indirect evidence the lesion may have originated from the [facility]. No regional inflammation or lymphadenopathy. Background gland parenchyma, and the right side salivary glands appear normal. 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 2. Right jugular Port-A-Cath in place. Stable visible post treatment appearance of the upper chest. Previous cervical ACDF. Electronically Signed   By: Genevie Ann M.D.   On: 07/10/2021 08:48   CT CHEST WO CONTRAST  Result Date: 05/10/2021 CLINICAL DATA:   Non-small cell lung cancer for routine follow-up. 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 EXAM: CT CHEST WITHOUT CONTRAST TECHNIQUE: Multidetector CT imaging of the chest was performed following the standard protocol without IV contrast. COMPARISON:  CT chest 03/28/2021 and 12/25/2020.  PET-CT 09/18/2020. FINDINGS: Cardiovascular: Right IJ Port-A-Cath extends to the superior cavoatrial junction. Mild coronary artery atherosclerosis. No significant vascular findings on noncontrast imaging. The heart size is normal. There is no pericardial effusion. Mediastinum/Nodes: There are no enlarged mediastinal, hilar or axillary lymph nodes.Stable 7 mm right paratracheal node on image 61/2. There is new thickening of the posterior wall of the right upper lobe bronchus, measuring up to 1.2 cm on image 62/2. Right hilar store shin and peribronchial opacities are otherwise similar, attributed to prior radiation therapy. The thyroid gland, trachea and esophagus demonstrate no significant findings. Lungs/Pleura: There is no pleural effusion or pneumothorax. Evolving radiation changes in the right perihilar region with some interval contraction of the overall process. Patchy ground-glass opacities more peripherally in the right lung have generally improved in the interval, with the exception of a single right upper lobe component measuring 10 mm on image 45/3 which appears new. There are no new or enlarging solid pulmonary nodules. The left lung appears clear. Upper abdomen: The visualized upper abdomen appears stable without significant findings. Musculoskeletal/Chest wall: There is no chest wall mass or suspicious osseous finding. Status post lower cervical fusion. IMPRESSION: 1. Evolving radiation changes in the right perihilar region, likely accounting for interval increased thickening of the posterior wall of the right upper lobe bronchus. 2. The more peripheral right lung  ground-glass opacities seen on the most recent study have generally improved with exception of one peripherally in the right upper lobe which appears new. Again, these are likely inflammatory, potentially drug reaction. Continued follow-up recommended. 3. No findings highly suspicious for local recurrence or metastatic disease. Electronically Signed   By: Richardean Sale M.D.   On: 05/10/2021 21:52  CARDIAC CATHETERIZATION  Result Date: 05/16/2021   The left ventricular systolic function is normal.   The left ventricular ejection fraction is 55-65% by visual estimate.   There is no aortic valve stenosis.   No angiographically apparent coronary artery disease Conclusion: No angiographically apparent coronary artery disease Normal LVEDP Normal LV systolic function Recommendations: Resume eliquis tomorrow morning Continue plavix for PAD indication Ongoing aggressive secondary prevention Follow up with Dr. Clayborn Bigness in 2 weeks   Pulmonary Function Test [facility] Only  Result Date: 07/20/2021 Spirometry Data Is Acceptable and Reproducible CONSIDER MILD Moderate Obstructive Airways Disease without  Significant Broncho-Dilator Response Also CONSIDER Moderate Restrictive Lung disease Consider outpatient Pulmonary Consultation if needed Clinical Correlation Advised       ASSESSMENT & PLAN:  1. Squamous cell carcinoma of right lung ([place])   2. Encounter for antineoplastic immunotherapy   3. Hypothyroidism due to medication    Cancer Staging  Squamous cell carcinoma of right lung [facility]) Staging form: Lung, AJCC 8th Edition - Clinical stage from 09/20/2020: Stage IIIA (cT1b, cN2, cM0) - Signed by [name], MD on 09/20/2020   #Squamous cell carcinoma of right lung, stage IIIA, Labs reviewed and discussed with patient.  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   Continue Synthroid 12mg.   # Dysphagia, and due for colonoscopy. Discussed about GI  work up.  Patient has been referred to gastroenterology.                                                         All questions were answered. The patient knows to call the clinic with any problems questions or concerns.  cc MMarinda Elk MD   Return of visit: Lab MD durvalumab in 2 weeks-  [name] MD, PhD  08/02/2021

## 2021-08-03 LAB — T4: T4, Total: 7 ug/dL (ref 4.5–12.0)

## 2021-08-07 ENCOUNTER — Ambulatory Visit
Admission: RE | Admit: 2021-08-07 | Discharge: 2021-08-07 | Disposition: A | Discharge status: Home / Self Care | Payer: Medicare Other | Source: Ambulatory Visit | Attending: Unknown Physician Specialty | Admitting: Unknown Physician Specialty

## 2021-08-07 DIAGNOSIS — R221 Localized swelling, mass and lump, neck: Secondary | ICD-10-CM

## 2021-08-07 DIAGNOSIS — D37030 Neoplasm of uncertain behavior of the parotid salivary glands: Secondary | ICD-10-CM | POA: Diagnosis not present

## 2021-08-07 NOTE — Procedures (Signed)
Pre Procedure Dx: Enlarging left sided parotid nodule Post Procedural Dx: Same  Technically successful US guided biopsy of indeterminate left sided parotid nodule.  EBL: None No immediate complications.   Ronny Bacon, MD Pager #: [phone]

## 2021-08-09 LAB — SURGICAL PATHOLOGY

## 2021-08-10 ENCOUNTER — Other Ambulatory Visit (INDEPENDENT_AMBULATORY_CARE_PROVIDER_SITE_OTHER): Payer: Self-pay | Admitting: Vascular Surgery

## 2021-08-16 ENCOUNTER — Other Ambulatory Visit: Payer: Self-pay

## 2021-08-16 ENCOUNTER — Encounter: Payer: Self-pay | Admitting: Oncology

## 2021-08-16 ENCOUNTER — Inpatient Hospital Stay: Payer: Medicare Other

## 2021-08-16 ENCOUNTER — Inpatient Hospital Stay: Payer: Medicare Other | Admitting: Oncology

## 2021-08-16 VITALS — BP 148/82 | HR 89 | Temp 97.3°F | Wt 203.0 lb

## 2021-08-16 DIAGNOSIS — Z5112 Encounter for antineoplastic immunotherapy: Secondary | ICD-10-CM | POA: Diagnosis not present

## 2021-08-16 DIAGNOSIS — C3491 Malignant neoplasm of unspecified part of right bronchus or lung: Secondary | ICD-10-CM

## 2021-08-16 DIAGNOSIS — K118 Other diseases of salivary glands: Secondary | ICD-10-CM

## 2021-08-16 DIAGNOSIS — E032 Hypothyroidism due to medicaments and other exogenous substances: Secondary | ICD-10-CM

## 2021-08-16 LAB — CBC WITH DIFFERENTIAL/PLATELET
Abs Immature Granulocytes: 0.01 10*3/uL (ref 0.00–0.07)
Basophils Absolute: 0.1 10*3/uL (ref 0.0–0.1)
Basophils Relative: 1 %
Eosinophils Absolute: 0.2 10*3/uL (ref 0.0–0.5)
Eosinophils Relative: 3 %
HCT: 39.8 % (ref 39.0–52.0)
Hemoglobin: 13 g/dL (ref 13.0–17.0)
Immature Granulocytes: 0 %
Lymphocytes Relative: 20 %
Lymphs Abs: 1.2 10*3/uL (ref 0.7–4.0)
MCH: 29 pg (ref 26.0–34.0)
MCHC: 32.7 g/dL (ref 30.0–36.0)
MCV: 88.6 fL (ref 80.0–100.0)
Monocytes Absolute: 0.7 10*3/uL (ref 0.1–1.0)
Monocytes Relative: 11 %
Neutro Abs: 4.1 10*3/uL (ref 1.7–7.7)
Neutrophils Relative %: 65 %
Platelets: 181 10*3/uL (ref 150–400)
RBC: 4.49 MIL/uL (ref 4.22–5.81)
RDW: 13.6 % (ref 11.5–15.5)
WBC: 6.3 10*3/uL (ref 4.0–10.5)
nRBC: 0 % (ref 0.0–0.2)

## 2021-08-16 LAB — COMPREHENSIVE METABOLIC PANEL
ALT: 18 U/L (ref 0–44)
AST: 22 U/L (ref 15–41)
Albumin: 4.1 g/dL (ref 3.5–5.0)
Alkaline Phosphatase: 73 U/L (ref 38–126)
Anion gap: 9 (ref 5–15)
BUN: 22 mg/dL (ref 8–23)
CO2: 26 mmol/L (ref 22–32)
Calcium: 9.2 mg/dL (ref 8.9–10.3)
Chloride: 107 mmol/L (ref 98–111)
Creatinine, Ser: 1.36 mg/dL — ABNORMAL HIGH (ref 0.61–1.24)
GFR, Estimated: 58 mL/min — ABNORMAL LOW (ref >60)
Glucose, Bld: 116 mg/dL — ABNORMAL HIGH (ref 70–99)
Potassium: 4 mmol/L (ref 3.5–5.1)
Sodium: 142 mmol/L (ref 135–145)
Total Bilirubin: 0.5 mg/dL (ref 0.3–1.2)
Total Protein: 7.4 g/dL (ref 6.5–8.1)

## 2021-08-16 MED ORDER — SODIUM CHLORIDE 0.9% FLUSH
10.0000 mL | INTRAVENOUS | Status: DC | PRN
  Administered 2021-08-16: 10 mL via INTRAVENOUS
  Filled 2021-08-16: qty 10

## 2021-08-16 MED ORDER — SODIUM CHLORIDE 0.9 % IV SOLN
Freq: Once | INTRAVENOUS | Status: AC
  Filled 2021-08-16: qty 250

## 2021-08-16 MED ORDER — HEPARIN SOD (PORK) LOCK FLUSH 100 UNIT/ML IV SOLN
INTRAVENOUS | Status: AC
  Administered 2021-08-16: 500 [IU]
  Filled 2021-08-16: qty 5

## 2021-08-16 MED ORDER — HEPARIN SOD (PORK) LOCK FLUSH 100 UNIT/ML IV SOLN
500.0000 [IU] | Freq: Once | INTRAVENOUS | Status: DC
  Filled 2021-08-16: qty 5

## 2021-08-16 MED ORDER — SODIUM CHLORIDE 0.9 % IV SOLN
10.0000 mg/kg | Freq: Once | INTRAVENOUS | Status: AC
  Administered 2021-08-16: 860 mg via INTRAVENOUS
  Filled 2021-08-16: qty 10

## 2021-08-16 MED ORDER — HEPARIN SOD (PORK) LOCK FLUSH 100 UNIT/ML IV SOLN
500.0000 [IU] | Freq: Once | INTRAVENOUS | Status: AC | PRN
  Filled 2021-08-16: qty 5

## 2021-08-16 NOTE — Patient Instructions (Signed)
[facility] AT [place]  Discharge Instructions: Thank you for choosing [facility] to provide your oncology and hematology care.  If you have a lab appointment with the [facility], please go directly to the [facility] and check in at the registration area.  Wear comfortable clothing and clothing appropriate for easy access to any Portacath or PICC line.   We strive to give you quality time with your provider. You may need to reschedule your appointment if you arrive late (15 or more minutes).  Arriving late affects you and other patients whose appointments are after yours.  Also, if you miss three or more appointments without notifying the office, you may be dismissed from the clinic at the providers discretion.      For prescription refill requests, have your pharmacy contact our office and allow 72 hours for refills to be completed.    Today you received the following chemotherapy and/or immunotherapy agents Durvalumab      To help prevent nausea and vomiting after your treatment, we encourage you to take your nausea medication as directed.  BELOW ARE SYMPTOMS THAT SHOULD BE REPORTED IMMEDIATELY: *FEVER GREATER THAN 100.4 F (38 C) OR HIGHER *CHILLS OR SWEATING *NAUSEA AND VOMITING THAT IS NOT CONTROLLED WITH YOUR NAUSEA MEDICATION *UNUSUAL SHORTNESS OF BREATH *UNUSUAL BRUISING OR BLEEDING *URINARY PROBLEMS (pain or burning when urinating, or frequent urination) *BOWEL PROBLEMS (unusual diarrhea, constipation, pain near the anus) TENDERNESS IN MOUTH AND THROAT WITH OR WITHOUT PRESENCE OF ULCERS (sore throat, sores in mouth, or a toothache) UNUSUAL RASH, SWELLING OR PAIN  UNUSUAL VAGINAL DISCHARGE OR ITCHING   Items with * indicate a potential emergency and should be followed up as soon as possible or go to the Emergency Department if any problems should occur.  Please show the CHEMOTHERAPY ALERT CARD or IMMUNOTHERAPY ALERT CARD at check-in to  the Emergency Department and triage nurse.  Should you have questions after your visit or need to cancel or reschedule your appointment, please contact [facility] [place] [facility] [place]  [phone] and follow the prompts.  Office hours are 8:00 a.m. to 4:30 p.m. Monday - Friday. Please note that voicemails left after 4:00 p.m. may not be returned until the following business day.  We are closed weekends and major holidays. You have access to a nurse at all times for urgent questions. Please call the main number to the clinic [phone] and follow the prompts.  For any non-urgent questions, you may also contact your provider using MyChart. We now offer e-Visits for anyone 73 and older to request care online for non-urgent symptoms. For details visit [email or web address].   Also download the MyChart app! Go to the app store, search "MyChart", open the app, select , and log in with your MyChart username and password.  Due to Covid, a mask is required upon entering the hospital/clinic. If you do not have a mask, one will be given to you upon arrival. For doctor visits, patients may have 1 support person aged 35 or older with them. For treatment visits, patients cannot have anyone with them due to current Covid guidelines and our immunocompromised population.

## 2021-08-16 NOTE — Progress Notes (Signed)
Hematology/Oncology Progress note  Patient Care Team: [name], MD as PCP - General (Physician Assistant) [name], MD as Referring Physician (Physician Assistant) Bary Castilla, [name], MD (General Surgery) Telford Nab, RN as Oncology Nurse Navigator  REFERRING PROVIDER: [name], MD  CHIEF COMPLAINTS/REASON FOR VISIT:  Follow up for lung cancer  HISTORY OF PRESENTING ILLNESS:   Joshua [name] is a  66 y.o.  male with PMH listed below was seen in consultation at the request of  [name], MD  for evaluation of lung mass  Patient presented to emergency room on 08/25/2020 for evaluation of hemoptysis, coughing.  He coughed up small amount of blood and a large clot.  No fever, chills, chest pain, abdominal pain.  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  Patient is on Plavix and Eliquis.  Since his ER visit, he has not coughed up more blood.  08/25/2020 CT angio chest PE with and without contrast showed ill-defined soft tissue adjacent to the right upper lobe bronchus resulting in narrowing of right upper lobe bronchus.  Also right paratracheal mediastinal adenopathy.  Scattered centrilobular groundglass opacities within the right lower lobe and to a lesser extent within the right lower lobe likely reflecting an infectious or inflammatory process. # 09/14/2020, right upper lobe lung biopsy and bronchoscopy brushing are both positive for squamous cell carcinoma.Right precarinal space lymph node insufficient lymph node sampling for staging. 09/18/2020, PET scan showed right upper lobe nodule maximal SUV 11.5.  Hypermetabolic cluster right paratracheal adenopathy with maximum SUV up to 12.  No findings of metastatic disease to the neck/abdomen/pelvis or skeleton.  # 10/03/2000, MRI brain with and without contrast showed no  evidence of intracranial metastatic disease. 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  # - NGS showed PD-L1 TPS 95%, LRP1B S953f, PIK3CA E545K, RB1 c1422-2A>T, RIT1 T83_ A84del, STK11 P2872f TPS53 R248W, TMB 19.27m22mmb, MS stable.   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  Management per ENT   #10/10/2020 started concurrent chemoradiation.  Patient received 4 cycles of carboplatin AUC of 2 and Taxol 45 mg per metered square  Additional chemotherapy was held due to thrombocytopenia, neutropenia Patient finished radiation on 11/27/2020  #12/31/2020 -03/21/2021, Durvalumab maintenance  03/28/2021, CT without contrast showed new linear right perihilar consolidation with associated traction bronchiectasis.  Likely postradiation changes.  Additional new peribronchial vascular/nodular groundglass opacities with areas of consolidation or seen primarily in the right upper and lower lobes, more distant from expected radiation fields.  Findings are possibly due to radiation-induced organizing pneumonia.  Or superimposed drug toxicity.  Recommend follow-up CT chest in 1 to 2 months for assessment of evaluation.  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  Patient was seen by pulmonology Dr. GonPatsey Bertholder my discussion with pulmonology Dr. 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  Recent CT showed improvement.  05/09/2021, repeat CT chest without contrast showed evolving radiation changes in the right perihilar region likely accounting for interval increase to thickening of the posterior wall of the right upper lobe bronchus.  The more peripheral right lung groundglass opacities seen on most recent study have generally improved with exception of one peripherally right upper lobe which appears new.  These are likely inflammatory.  No findings highly suspicious  for local  05/30/2021 resumed on Durvalumab.  Dr. Patsey Berthold switched inhaler to  [facility] as he is not able to tolerate LABA/LAMA combination.  INTERVAL HISTORY Joshua [name] is a 66 y.o. male who has above history reviewed by me today presents for follow up visit for Stage III lung squamous cell carcinoma. Patient reports feeling well.  Denies any nausea vomiting diarrhea   Review of Systems  Constitutional:  Negative for appetite change, chills, fatigue, fever and unexpected weight change.  HENT:   Negative for hearing loss and voice change.   Eyes:  Negative for eye problems and icterus.  Respiratory:  Negative for chest tightness, cough, hemoptysis and shortness of breath.   Cardiovascular:  Negative for chest pain and leg swelling.  Gastrointestinal:  Negative for abdominal distention and abdominal pain.  Endocrine: Negative for hot flashes.  Genitourinary:  Negative for difficulty urinating, dysuria and frequency.   Musculoskeletal:  Negative for arthralgias.  Skin:  Negative for itching and rash.  Neurological:  Negative for light-headedness and numbness.  Hematological:  Negative for adenopathy. Does not bruise/bleed easily.  Psychiatric/Behavioral:  Negative for confusion.    MEDICAL HISTORY:  Past Medical History:  Diagnosis Date   Allergy    Cancer ([place])    lung   Dyspnea    former smoker. only doe   History of kidney stones    Hypertension    Peripheral vascular disease ([place])    Pre-diabetes    Sleep apnea     SURGICAL HISTORY: Past Surgical History:  Procedure Laterality Date   ANTERIOR CERVICAL DECOMP/DISCECTOMY FUSION N/A 03/14/2020   Procedure: ANTERIOR CERVICAL DECOMPRESSION/DISCECTOMY FUSION 3 LEVELS C4-7;  Surgeon: Meade Maw, MD;  Location: [facility];  Service: Neurosurgery;  Laterality: N/A;   BACK SURGERY  2011   CENTRAL LINE INSERTION Right 09/10/2016   Procedure: CENTRAL LINE INSERTION;  Surgeon: Katha Cabal, MD;   Location: [facility];  Service: Vascular;  Laterality: Right;   CERVICAL FUSION     C4-c7 on Mar 14, 2020   COLONOSCOPY  2013 ?   CORONARY ANGIOPLASTY     FEMORAL-POPLITEAL BYPASS GRAFT Right 09/10/2016   Procedure: BYPASS GRAFT FEMORAL-POPLITEAL ARTERY ( BELOW KNEE );  Surgeon: Katha Cabal, MD;  Location: [facility];  Service: Vascular;  Laterality: Right;   FEMORAL-TIBIAL BYPASS GRAFT Right 03/23/2020   Procedure: BYPASS GRAFT RIGHT FEMORAL- DISTAL TIBIAL ARTERY WITH DISTAL FLOW GRAFT;  Surgeon: Katha Cabal, MD;  Location: [facility];  Service: Vascular;  Laterality: Right;   LEFT HEART CATH AND CORONARY ANGIOGRAPHY Left 05/16/2021   Procedure: LEFT HEART CATH AND CORONARY ANGIOGRAPHY;  Surgeon: Andrez Grime, MD;  Location: [place] [facility];  Service: Cardiovascular;  Laterality: Left;   LOWER EXTREMITY ANGIOGRAPHY Right 11/18/2016   Procedure: Lower Extremity Angiography;  Surgeon: Katha Cabal, MD;  Location: [place] [facility];  Service: Cardiovascular;  Laterality: Right;   LOWER EXTREMITY ANGIOGRAPHY Right 12/09/2016   Procedure: Lower Extremity Angiography;  Surgeon: Katha Cabal, MD;  Location: [place] [facility];  Service: Cardiovascular;  Laterality: Right;   LOWER EXTREMITY ANGIOGRAPHY Right 11/15/2019   Procedure: LOWER EXTREMITY ANGIOGRAPHY;  Surgeon: Katha Cabal, MD;  Location: [place] [facility];  Service: Cardiovascular;  Laterality: Right;   LOWER EXTREMITY ANGIOGRAPHY Right 12/01/2019   Procedure: Lower Extremity Angiography;  Surgeon: Algernon Huxley, MD;  Location: [place] [facility];  Service: Cardiovascular;  Laterality: Right;   LOWER EXTREMITY ANGIOGRAPHY Right 12/02/2019   Procedure:  LOWER EXTREMITY ANGIOGRAPHY;  Surgeon: Renford Dills, MD;  Location: [facility];  Service: Cardiovascular;  Laterality: Right;   LOWER EXTREMITY ANGIOGRAPHY Right 03/23/2020   Procedure: Lower Extremity Angiography;  Surgeon: Renford Dills, MD;  Location: [facility];  Service: Cardiovascular;  Laterality: Right;   LOWER EXTREMITY INTERVENTION  11/18/2016   Procedure: Lower Extremity Intervention;  Surgeon: Renford Dills, MD;  Location: [facility];  Service: Cardiovascular;;   PERIPHERAL VASCULAR CATHETERIZATION Right 01/02/2015   Procedure: Lower Extremity Angiography;  Surgeon: Renford Dills, MD;  Location: [facility];  Service: Cardiovascular;  Laterality: Right;   PERIPHERAL VASCULAR CATHETERIZATION Right 06/18/2015   Procedure: Lower Extremity Angiography;  Surgeon: Annice Needy, MD;  Location: [facility];  Service: Cardiovascular;  Laterality: Right;   PERIPHERAL VASCULAR CATHETERIZATION  06/18/2015   Procedure: Lower Extremity Intervention;  Surgeon: Annice Needy, MD;  Location: [facility];  Service: Cardiovascular;;   PERIPHERAL VASCULAR CATHETERIZATION N/A 10/09/2015   Procedure: Abdominal Aortogram w/Lower Extremity;  Surgeon: Renford Dills, MD;  Location: [facility];  Service: Cardiovascular;  Laterality: N/A;   PERIPHERAL VASCULAR CATHETERIZATION  10/09/2015   Procedure: Lower Extremity Intervention;  Surgeon: Renford Dills, MD;  Location: [facility];  Service: Cardiovascular;;   PERIPHERAL VASCULAR CATHETERIZATION Right 10/10/2015   Procedure: Lower Extremity Angiography;  Surgeon: Annice Needy, MD;  Location: [facility];  Service: Cardiovascular;  Laterality: Right;   PERIPHERAL VASCULAR CATHETERIZATION Right 10/10/2015   Procedure: Lower Extremity Intervention;  Surgeon: Annice Needy, MD;  Location: [facility];  Service: Cardiovascular;  Laterality: Right;   PERIPHERAL VASCULAR CATHETERIZATION Right 12/03/2015   Procedure: Lower Extremity Angiography;  Surgeon: Annice Needy, MD;  Location: [facility];  Service: Cardiovascular;  Laterality: Right;   PERIPHERAL VASCULAR CATHETERIZATION Right 12/04/2015   Procedure:  Lower Extremity Angiography;  Surgeon: Annice Needy, MD;  Location: [facility];  Service: Cardiovascular;  Laterality: Right;   PERIPHERAL VASCULAR CATHETERIZATION  12/04/2015   Procedure: Lower Extremity Intervention;  Surgeon: Annice Needy, MD;  Location: [facility];  Service: Cardiovascular;;   PERIPHERAL VASCULAR CATHETERIZATION Right 06/30/2016   Procedure: Lower Extremity Angiography;  Surgeon: Annice Needy, MD;  Location: [facility];  Service: Cardiovascular;  Laterality: Right;   PERIPHERAL VASCULAR CATHETERIZATION Right 06/25/2016   Procedure: Lower Extremity Angiography;  Surgeon: Renford Dills, MD;  Location: [facility];  Service: Cardiovascular;  Laterality: Right;   PERIPHERAL VASCULAR CATHETERIZATION Right 07/01/2016   Procedure: Lower Extremity Angiography;  Surgeon: Renford Dills, MD;  Location: [facility];  Service: Cardiovascular;  Laterality: Right;   PERIPHERAL VASCULAR CATHETERIZATION  07/01/2016   Procedure: Lower Extremity Intervention;  Surgeon: Renford Dills, MD;  Location: [facility];  Service: Cardiovascular;;   PERIPHERAL VASCULAR CATHETERIZATION Right 08/01/2016   Procedure: Lower Extremity Angiography;  Surgeon: Renford Dills, MD;  Location: [facility];  Service: Cardiovascular;  Laterality: Right;   PORTA CATH INSERTION N/A 10/05/2020   Procedure: PORTA CATH INSERTION;  Surgeon: Renford Dills, MD;  Location: [facility];  Service: Cardiovascular;  Laterality: N/A;   stent placement in right leg Right    VIDEO BRONCHOSCOPY N/A 09/14/2020   Procedure: VIDEO BRONCHOSCOPY WITH FLUORO;  Surgeon: Salena Saner, MD;  Location: [facility];  Service: Cardiopulmonary;  Laterality: N/A;   VIDEO BRONCHOSCOPY WITH ENDOBRONCHIAL ULTRASOUND N/A 09/14/2020   Procedure: VIDEO BRONCHOSCOPY WITH ENDOBRONCHIAL ULTRASOUND;  Surgeon: Tyler Pita, MD;  Location: [facility];  Service:  Cardiopulmonary;  Laterality: N/A;    SOCIAL HISTORY: Social History   Socioeconomic History   Marital status: Single    Spouse name: Not on file   Number of children: Not on file   Years of education: Not on file   Highest education level: Not on file  Occupational History   Occupation: unemployed  Tobacco Use   Smoking status: Former    Packs/day: 2.00    Years: 38.00    Pack years: 76.00    Types: Cigarettes    Quit date: 12/20/2014    Years since quitting: 6.6   Smokeless tobacco: Former   Tobacco comments:    quit smoking in 2017 most smoked 2   Vaping Use   Vaping Use: Never used  Substance and Sexual Activity   Alcohol use: Not Currently   Drug use: No   Sexual activity: Yes  Other Topics Concern   Not on file  Social History Narrative   Not on file   Social Determinants of Health   Financial Resource Strain: Not on file  Food Insecurity: Not on file  Transportation Needs: Not on file  Physical Activity: Not on file  Stress: Not on file  Social Connections: Not on file  Intimate Partner Violence: Not on file    FAMILY HISTORY: Family History  Problem Relation Age of Onset   Diabetes Mother    Hypertension Mother    Heart murmur Mother    Leukemia Mother    Throat cancer Maternal Grandmother    Colon cancer Neg Hx    Breast cancer Neg Hx     ALLERGIES:  is allergic to tape.  MEDICATIONS:  Current Outpatient Medications  Medication Sig Dispense Refill   acetaminophen (TYLENOL) 325 MG tablet Take 650 mg by mouth every 6 (six) hours as needed for moderate pain.     clopidogrel (PLAVIX) 75 MG tablet TAKE 1 TABLET BY MOUTH  DAILY 90 tablet 3   ELIQUIS 5 MG TABS tablet TAKE 1 TABLET BY MOUTH  TWICE DAILY 120 tablet 5   gabapentin (NEURONTIN) 300 MG capsule Take 2 capsules (600 mg total) by mouth 3 (three) times daily. (Patient taking differently: Take 300-900 mg by mouth at bedtime as needed (pain).) 540 capsule 1   levothyroxine (SYNTHROID) 75 MCG  tablet TAKE 1 TABLET BY MOUTH ONCE DAILY BEFORE BREAKFAST 30 tablet 0   lidocaine-prilocaine (EMLA) cream Apply to affected area once (Patient taking differently: Apply 1 application topically daily as needed (port access).) 30 g 3   potassium chloride SA (KLOR-CON M) 20 MEQ tablet Take 1 tablet (20 mEq total) by mouth daily. 30 tablet 1   simvastatin (ZOCOR) 40 MG tablet Take 40 mg by mouth at bedtime.     Tiotropium Bromide-Olodaterol (STIOLTO RESPIMAT) 2.5-2.5 MCG/ACT AERS Inhale 2.5 mcg into the lungs 2 (two) times daily. 4 g 0   No current facility-administered medications for this visit.   Facility-Administered Medications Ordered in Other Visits  Medication Dose Route Frequency Provider Last Rate Last Admin   heparin lock flush 100 unit/mL  500 Units Intravenous Once [name], MD       sodium chloride flush (NS) 0.9 % injection 10 mL  10 mL Intravenous PRN [name], MD  PHYSICAL EXAMINATION: ECOG PERFORMANCE STATUS: 1 - Symptomatic but completely ambulatory Vitals:   08/16/21 0835  BP: (!) 148/82  Pulse: 89  Temp: (!) 97.3 F (36.3 C)   Filed Weights   08/16/21 0835  Weight: 203 lb (92.1 kg)    Physical Exam Constitutional:      General: He is not in acute distress. HENT:     Head: Normocephalic and atraumatic.  Eyes:     General: No scleral icterus. Cardiovascular:     Rate and Rhythm: Normal rate and regular rhythm.     Heart sounds: Normal heart sounds.  Pulmonary:     Effort: Pulmonary effort is normal. No respiratory distress.     Breath sounds: No wheezing.  Abdominal:     General: Bowel sounds are normal. There is no distension.     Palpations: Abdomen is soft.  Musculoskeletal:        General: No deformity. Normal range of motion.     Cervical back: Normal range of motion and neck supple.  Skin:    General: Skin is warm and dry.     Findings: No erythema or rash.  Neurological:     Mental Status: He is alert and oriented to person, place, and  time. Mental status is at baseline.     Cranial Nerves: No cranial nerve deficit.     Coordination: Coordination normal.  Psychiatric:        Mood and Affect: Mood normal.    LABORATORY DATA:  I have reviewed the data as listed Lab Results  Component Value Date   WBC 5.2 08/02/2021   HGB 12.4 (L) 08/02/2021   HCT 38.0 (L) 08/02/2021   MCV 88.8 08/02/2021   PLT 171 08/02/2021   Recent Labs    06/20/21 0833 07/04/21 0838 08/02/21 0818  NA 140 139 138  K 3.2* 3.9 3.8  CL 103 104 106  CO2 $Re'27 25 26  'VUN$ GLUCOSE 155* 131* 117*  BUN 13 25* 24*  CREATININE 1.10 1.45* 1.22  CALCIUM 8.6* 8.9 8.5*  GFRNONAA >60 54* >60  PROT 7.4 7.6 7.0  ALBUMIN 4.1 4.1 4.2  AST $Re'30 21 24  'pWn$ ALT $R'19 14 19  'rx$ ALKPHOS 67 72 65  BILITOT 0.4 0.4 0.3    Iron/TIBC/Ferritin/ %Sat    Component Value Date/Time   IRON 48 08/30/2020 1052   TIBC 391 08/30/2020 1052   FERRITIN 25 08/30/2020 1052   IRONPCTSAT 12 (L) 08/30/2020 1052      RADIOGRAPHIC STUDIES: I have personally reviewed the radiological images as listed and agreed with the findings in the report. CT SOFT TISSUE NECK W CONTRAST  Result Date: 07/10/2021 CLINICAL DATA:  66 year old male with history of non-small cell right upper lobe lung cancer. Right lung radiation. Left-side palpable mass. EXAM: CT NECK WITH CONTRAST TECHNIQUE: Multidetector CT imaging of the neck was performed using the standard protocol following the bolus administration of intravenous contrast. CONTRAST:  8mL ISOVUE-300 IOPAMIDOL (ISOVUE-300) INJECTION 61% COMPARISON:  Cervical spine MRI 02/06/2020.  Chest CT 05/09/2021. FINDINGS: Pharynx and larynx: Negative larynx and pharynx. Parapharyngeal and retropharyngeal spaces are normal. Salivary glands: Negative sublingual space. Right side submandibular and parotid glands are within normal limits. Cystic mass with thin but irregular rim of soft tissue (series 6, image 39 and series 5, image 83) measures up to 29 mm diameter and is  situated between the left submandibular and inferior parotid glands in the left neck on axial series 3, image 41. The lesion is inseparable  from both glands, possibly with a "claw sign" with the submandibular gland on sagittal image 81. But the underlying left side salivary gland parenchyma elsewhere is normal. No regional inflammation. This area is not clearly visible on the 2019 cervical spine MRI. And there is no other neck cross-sectional imaging. Thyroid: Diminutive, negative. Lymph nodes: No solid cervical lymphadenopathy superimposed on the left side cystic mass detailed above. Bilateral cervical nodes are diminutive. Vascular: Right IJ Port-A-Cath is partially visible. Major vascular structures in the neck and at the skull base are patent. Left vertebral artery is dominant and the right is diminutive at the skull base. Limited intracranial: Negative. Visualized orbits: Not included. Mastoids and visualized paranasal sinuses: Visible sinuses are well aerated. There is mild right maxillary alveolar recess mucosal thickening. Visible tympanic cavities and mastoids are clear. Skeleton: Left mandible appears intact and normal near the cystic mass. Previous cervical ACDF C4 through C7 with evidence of solid arthrodesis. Other cervical spine degeneration. No acute or suspicious osseous lesion identified. Upper chest: Stable compared to the October chest CT. No superior mediastinal lymphadenopathy. IMPRESSION: 1. Left neck cystic mass with soft tissue rim is 2.9 cm and situated directly between the left submandibular and inferior parotid glands, inseparable from both. Indirect evidence the lesion may have originated from the [facility]. No regional inflammation or lymphadenopathy. Background gland parenchyma, and the right side salivary glands appear normal. 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 2. Right jugular Port-A-Cath in place. Stable visible post treatment appearance of the  upper chest. Previous cervical ACDF. Electronically Signed   By: Genevie Ann M.D.   On: 07/10/2021 08:48   Pulmonary Function Test [facility] Only  Result Date: 07/20/2021 Spirometry Data Is Acceptable and Reproducible CONSIDER MILD Moderate Obstructive Airways Disease without  Significant Broncho-Dilator Response Also CONSIDER Moderate Restrictive Lung disease Consider outpatient Pulmonary Consultation if needed Clinical Correlation Advised   [place] CORE BIOPSY (SOFT TISSUE)  Result Date: 08/07/2021 INDICATION: Enlarging left-sided parotid nodule. History of previous ultrasound-guided core needle biopsy on 11/07/2020. Please perform repeat ultrasound-guided biopsy for tissue diagnostic purposes. EXAM: ULTRASOUND-GUIDED LEFT PAROTID NODULE BIOPSY COMPARISON:  Neck CT-07/09/2021 Ultrasound-guided left parotid nodule biopsy-11/07/2020 MEDICATIONS: None ANESTHESIA/SEDATION: None COMPLICATIONS: None immediate. TECHNIQUE: Informed written consent was obtained from the patient after a discussion of the risks, benefits and alternatives to treatment. Questions regarding the procedure were encouraged and answered. Initial ultrasound scanning demonstrated unchanged size and appearance of the approximately 2.8 x 1.7 x 2.2 cm nodule adjacent to the anterior inferior aspect the left parotid gland (images 2 through 11), similar to neck CT performed 07/09/2021, image 42, series 3. An ultrasound image was saved for documentation purposes. The procedure was planned. A timeout was performed prior to the initiation of the procedure. The operative was prepped and draped in the usual sterile fashion, and a sterile drape was applied covering the operative field. A timeout was performed prior to the initiation of the procedure. Local anesthesia was provided with 1% lidocaine with epinephrine. Under direct ultrasound guidance, an 18 gauge core needle device was utilized to obtain to obtain 6 core needle biopsies of the indeterminate left  parotid gland nodule. The samples were placed in saline and submitted to pathology. The needle was removed and hemostasis was achieved with manual compression. Post procedure scan was negative for significant hematoma. A dressing was applied. The patient tolerated the procedure well without immediate postprocedural complication. IMPRESSION: Technically successful ultrasound guided biopsy of indeterminate left parotid gland nodule Electronically  Signed   By: Sandi Mariscal M.D.   On: 08/07/2021 16:40       ASSESSMENT & PLAN:  1. Squamous cell carcinoma of right lung ([place])   2. Encounter for antineoplastic immunotherapy   3. Hypothyroidism due to medication   4. Parotid nodule    Cancer Staging  Squamous cell carcinoma of right lung [facility]) Staging form: Lung, AJCC 8th Edition - Clinical stage from 09/20/2020: Stage IIIA (cT1b, cN2, cM0) - Signed by [name], MD on 09/20/2020   #Squamous cell carcinoma of right lung, stage IIIA, Labs are reviewed and discussed with patient. Procedure was Durvalumab. 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   Continue Synthroid 52mcg.   #Left-sided parotid nodule, patient follows up with ENT.  Nodule was biopsied and pathology was not diagnostic, acellular debris's with amylase crystalloids.                                                         All questions were answered. The patient knows to call the clinic with any problems questions or concerns.  cc [name], MD   Return of visit: Lab MD durvalumab in 2 weeks-  [name], MD, PhD  08/16/2021

## 2021-08-22 ENCOUNTER — Other Ambulatory Visit: Payer: Self-pay

## 2021-08-22 ENCOUNTER — Ambulatory Visit
Admission: RE | Admit: 2021-08-22 | Discharge: 2021-08-22 | Disposition: A | Discharge status: Home / Self Care | Payer: Medicare Other | Source: Ambulatory Visit | Attending: Oncology | Admitting: Oncology

## 2021-08-22 DIAGNOSIS — C3491 Malignant neoplasm of unspecified part of right bronchus or lung: Secondary | ICD-10-CM

## 2021-08-30 ENCOUNTER — Inpatient Hospital Stay: Payer: Medicare Other | Attending: Oncology

## 2021-08-30 ENCOUNTER — Encounter: Payer: Self-pay | Admitting: Oncology

## 2021-08-30 ENCOUNTER — Other Ambulatory Visit: Payer: Self-pay | Admitting: Oncology

## 2021-08-30 ENCOUNTER — Other Ambulatory Visit: Payer: Self-pay

## 2021-08-30 ENCOUNTER — Inpatient Hospital Stay: Payer: Medicare Other | Admitting: Oncology

## 2021-08-30 ENCOUNTER — Inpatient Hospital Stay: Payer: Medicare Other

## 2021-08-30 VITALS — BP 127/85 | HR 86 | Temp 97.5°F | Resp 16 | Ht 71.0 in | Wt 206.0 lb

## 2021-08-30 DIAGNOSIS — Z5112 Encounter for antineoplastic immunotherapy: Secondary | ICD-10-CM | POA: Diagnosis not present

## 2021-08-30 DIAGNOSIS — C3491 Malignant neoplasm of unspecified part of right bronchus or lung: Secondary | ICD-10-CM

## 2021-08-30 DIAGNOSIS — E032 Hypothyroidism due to medicaments and other exogenous substances: Secondary | ICD-10-CM | POA: Insufficient documentation

## 2021-08-30 DIAGNOSIS — C3411 Malignant neoplasm of upper lobe, right bronchus or lung: Secondary | ICD-10-CM | POA: Diagnosis present

## 2021-08-30 LAB — CBC WITH DIFFERENTIAL/PLATELET
Abs Immature Granulocytes: 0.01 10*3/uL (ref 0.00–0.07)
Basophils Absolute: 0.1 10*3/uL (ref 0.0–0.1)
Basophils Relative: 2 %
Eosinophils Absolute: 0.2 10*3/uL (ref 0.0–0.5)
Eosinophils Relative: 3 %
HCT: 38.7 % — ABNORMAL LOW (ref 39.0–52.0)
Hemoglobin: 12.6 g/dL — ABNORMAL LOW (ref 13.0–17.0)
Immature Granulocytes: 0 %
Lymphocytes Relative: 19 %
Lymphs Abs: 1 10*3/uL (ref 0.7–4.0)
MCH: 28.8 pg (ref 26.0–34.0)
MCHC: 32.6 g/dL (ref 30.0–36.0)
MCV: 88.4 fL (ref 80.0–100.0)
Monocytes Absolute: 0.6 10*3/uL (ref 0.1–1.0)
Monocytes Relative: 12 %
Neutro Abs: 3.3 10*3/uL (ref 1.7–7.7)
Neutrophils Relative %: 64 %
Platelets: 176 10*3/uL (ref 150–400)
RBC: 4.38 MIL/uL (ref 4.22–5.81)
RDW: 13.5 % (ref 11.5–15.5)
WBC: 5.2 10*3/uL (ref 4.0–10.5)
nRBC: 0 % (ref 0.0–0.2)

## 2021-08-30 LAB — COMPREHENSIVE METABOLIC PANEL
ALT: 14 U/L (ref 0–44)
AST: 19 U/L (ref 15–41)
Albumin: 4 g/dL (ref 3.5–5.0)
Alkaline Phosphatase: 68 U/L (ref 38–126)
Anion gap: 9 (ref 5–15)
BUN: 21 mg/dL (ref 8–23)
CO2: 25 mmol/L (ref 22–32)
Calcium: 9.2 mg/dL (ref 8.9–10.3)
Chloride: 106 mmol/L (ref 98–111)
Creatinine, Ser: 1.15 mg/dL (ref 0.61–1.24)
GFR, Estimated: >60 mL/min (ref >60)
Glucose, Bld: 124 mg/dL — ABNORMAL HIGH (ref 70–99)
Potassium: 3.7 mmol/L (ref 3.5–5.1)
Sodium: 140 mmol/L (ref 135–145)
Total Bilirubin: 0.3 mg/dL (ref 0.3–1.2)
Total Protein: 7.1 g/dL (ref 6.5–8.1)

## 2021-08-30 LAB — TSH: TSH: 84 u[IU]/mL — ABNORMAL HIGH (ref 0.350–4.500)

## 2021-08-30 MED ORDER — SODIUM CHLORIDE 0.9 % IV SOLN
10.0000 mg/kg | Freq: Once | INTRAVENOUS | Status: AC
  Administered 2021-08-30: 860 mg via INTRAVENOUS
  Filled 2021-08-30: qty 10

## 2021-08-30 MED ORDER — HEPARIN SOD (PORK) LOCK FLUSH 100 UNIT/ML IV SOLN
500.0000 [IU] | Freq: Once | INTRAVENOUS | Status: AC | PRN
  Administered 2021-08-30: 500 [IU]
  Filled 2021-08-30: qty 5

## 2021-08-30 MED ORDER — SODIUM CHLORIDE 0.9 % IV SOLN
Freq: Once | INTRAVENOUS | Status: AC
  Filled 2021-08-30: qty 250

## 2021-08-30 NOTE — Progress Notes (Signed)
Hematology/Oncology Progress note  Patient Care Team: [name], MD as PCP - General (Physician Assistant) [name], MD as Referring Physician (Physician Assistant) Bary Castilla, [name], MD (General Surgery) Telford Nab, RN as Oncology Nurse Navigator  REFERRING PROVIDER: [name], MD  CHIEF COMPLAINTS/REASON FOR VISIT:  Follow up for lung cancer  HISTORY OF PRESENTING ILLNESS:   Joshua [name] is a  66 y.o.  male with PMH listed below was seen in consultation at the request of  [name], MD  for evaluation of lung mass  Patient presented to emergency room on 08/25/2020 for evaluation of hemoptysis, coughing.  He coughed up small amount of blood and a large clot.  No fever, chills, chest pain, abdominal pain.  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  Patient is on Plavix and Eliquis.  Since his ER visit, he has not coughed up more blood.  08/25/2020 CT angio chest PE with and without contrast showed ill-defined soft tissue adjacent to the right upper lobe bronchus resulting in narrowing of right upper lobe bronchus.  Also right paratracheal mediastinal adenopathy.  Scattered centrilobular groundglass opacities within the right lower lobe and to a lesser extent within the right lower lobe likely reflecting an infectious or inflammatory process. # 09/14/2020, right upper lobe lung biopsy and bronchoscopy brushing are both positive for squamous cell carcinoma.Right precarinal space lymph node insufficient lymph node sampling for staging. 09/18/2020, PET scan showed right upper lobe nodule maximal SUV 11.5.  Hypermetabolic cluster right paratracheal adenopathy with maximum SUV up to 12.  No findings of metastatic disease to the neck/abdomen/pelvis or skeleton.  # 10/03/2000, MRI brain with and without contrast showed no  evidence of intracranial metastatic disease. 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  # - NGS showed PD-L1 TPS 95%, LRP1B S953f, PIK3CA E545K, RB1 c1422-2A>T, RIT1 T83_ A84del, STK11 P2872f TPS53 R248W, TMB 19.27m22mmb, MS stable.   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  Management per ENT   #10/10/2020 started concurrent chemoradiation.  Patient received 4 cycles of carboplatin AUC of 2 and Taxol 45 mg per metered square  Additional chemotherapy was held due to thrombocytopenia, neutropenia Patient finished radiation on 11/27/2020  #12/31/2020 -03/21/2021, Durvalumab maintenance  03/28/2021, CT without contrast showed new linear right perihilar consolidation with associated traction bronchiectasis.  Likely postradiation changes.  Additional new peribronchial vascular/nodular groundglass opacities with areas of consolidation or seen primarily in the right upper and lower lobes, more distant from expected radiation fields.  Findings are possibly due to radiation-induced organizing pneumonia.  Or superimposed drug toxicity.  Recommend follow-up CT chest in 1 to 2 months for assessment of evaluation.  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  Patient was seen by pulmonology Dr. GonPatsey Bertholder my discussion with pulmonology Dr. 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  Recent CT showed improvement.  05/09/2021, repeat CT chest without contrast showed evolving radiation changes in the right perihilar region likely accounting for interval increase to thickening of the posterior wall of the right upper lobe bronchus.  The more peripheral right lung groundglass opacities seen on most recent study have generally improved with exception of one peripherally right upper lobe which appears new.  These are likely inflammatory.  No findings highly suspicious  for local  05/30/2021 resumed on Durvalumab.  Dr. Patsey Berthold switched inhaler to  [facility] as he is not able to tolerate LABA/LAMA combination.  #Left-sided parotid nodule, patient follows up with ENT.  Nodule was biopsied and pathology was not diagnostic, acellular debris's with amylase crystalloids.#Left-sided parotid nodule, patient follows up with ENT.  Nodule was biopsied and pathology was not diagnostic, acellular debris's with amylase crystalloids.  INTERVAL HISTORY Joshua [name] is a 66 y.o. male who has above history reviewed by me today presents for follow up visit for Stage III lung squamous cell carcinoma. Patient reports feeling well.   Patient has no new complaints.  Denies cough, shortness of breath, diarrhea.  Review of Systems  Constitutional:  Negative for appetite change, chills, fatigue, fever and unexpected weight change.  HENT:   Negative for hearing loss and voice change.   Eyes:  Negative for eye problems and icterus.  Respiratory:  Negative for chest tightness, cough, hemoptysis and shortness of breath.   Cardiovascular:  Negative for chest pain and leg swelling.  Gastrointestinal:  Negative for abdominal distention and abdominal pain.  Endocrine: Negative for hot flashes.  Genitourinary:  Negative for difficulty urinating, dysuria and frequency.   Musculoskeletal:  Negative for arthralgias.  Skin:  Negative for itching and rash.  Neurological:  Negative for light-headedness and numbness.  Hematological:  Negative for adenopathy. Does not bruise/bleed easily.  Psychiatric/Behavioral:  Negative for confusion.    MEDICAL HISTORY:  Past Medical History:  Diagnosis Date   Allergy    Cancer ([place])    lung   Dyspnea    former smoker. only doe   History of kidney stones    Hypertension    Peripheral vascular disease ([place])    Pre-diabetes    Sleep apnea     SURGICAL HISTORY: Past Surgical History:  Procedure Laterality Date   ANTERIOR  CERVICAL DECOMP/DISCECTOMY FUSION N/A 03/14/2020   Procedure: ANTERIOR CERVICAL DECOMPRESSION/DISCECTOMY FUSION 3 LEVELS C4-7;  Surgeon: Meade Maw, MD;  Location: [facility];  Service: Neurosurgery;  Laterality: N/A;   BACK SURGERY  2011   CENTRAL LINE INSERTION Right 09/10/2016   Procedure: CENTRAL LINE INSERTION;  Surgeon: Katha Cabal, MD;  Location: [facility];  Service: Vascular;  Laterality: Right;   CERVICAL FUSION     C4-c7 on Mar 14, 2020   COLONOSCOPY  2013 ?   CORONARY ANGIOPLASTY     FEMORAL-POPLITEAL BYPASS GRAFT Right 09/10/2016   Procedure: BYPASS GRAFT FEMORAL-POPLITEAL ARTERY ( BELOW KNEE );  Surgeon: Katha Cabal, MD;  Location: [facility];  Service: Vascular;  Laterality: Right;   FEMORAL-TIBIAL BYPASS GRAFT Right 03/23/2020   Procedure: BYPASS GRAFT RIGHT FEMORAL- DISTAL TIBIAL ARTERY WITH DISTAL FLOW GRAFT;  Surgeon: Katha Cabal, MD;  Location: [facility];  Service: Vascular;  Laterality: Right;   LEFT HEART CATH AND CORONARY ANGIOGRAPHY Left 05/16/2021   Procedure: LEFT HEART CATH AND CORONARY ANGIOGRAPHY;  Surgeon: Andrez Grime, MD;  Location: [place] [facility];  Service: Cardiovascular;  Laterality: Left;   LOWER EXTREMITY ANGIOGRAPHY Right 11/18/2016   Procedure: Lower Extremity Angiography;  Surgeon: Katha Cabal, MD;  Location: [place] [facility];  Service: Cardiovascular;  Laterality: Right;   LOWER EXTREMITY ANGIOGRAPHY Right 12/09/2016   Procedure: Lower Extremity Angiography;  Surgeon: Katha Cabal, MD;  Location: [place] [facility];  Service: Cardiovascular;  Laterality: Right;   LOWER EXTREMITY ANGIOGRAPHY Right 11/15/2019   Procedure: LOWER EXTREMITY ANGIOGRAPHY;  Surgeon: Katha Cabal, MD;  Location:  [place] [facility];  Service: Cardiovascular;  Laterality: Right;   LOWER EXTREMITY ANGIOGRAPHY Right 12/01/2019   Procedure: Lower Extremity Angiography;  Surgeon: Algernon Huxley, MD;  Location: [place] [facility];   Service: Cardiovascular;  Laterality: Right;   LOWER EXTREMITY ANGIOGRAPHY Right 12/02/2019   Procedure: LOWER EXTREMITY ANGIOGRAPHY;  Surgeon: Katha Cabal, MD;  Location: [place] [facility];  Service: Cardiovascular;  Laterality: Right;   LOWER EXTREMITY ANGIOGRAPHY Right 03/23/2020   Procedure: Lower Extremity Angiography;  Surgeon: Katha Cabal, MD;  Location: [place] [facility];  Service: Cardiovascular;  Laterality: Right;   LOWER EXTREMITY INTERVENTION  11/18/2016   Procedure: Lower Extremity Intervention;  Surgeon: Katha Cabal, MD;  Location: [place] [facility];  Service: Cardiovascular;;   PERIPHERAL VASCULAR CATHETERIZATION Right 01/02/2015   Procedure: Lower Extremity Angiography;  Surgeon: Katha Cabal, MD;  Location: [place] [facility];  Service: Cardiovascular;  Laterality: Right;   PERIPHERAL VASCULAR CATHETERIZATION Right 06/18/2015   Procedure: Lower Extremity Angiography;  Surgeon: Algernon Huxley, MD;  Location: [place] [facility];  Service: Cardiovascular;  Laterality: Right;   PERIPHERAL VASCULAR CATHETERIZATION  06/18/2015   Procedure: Lower Extremity Intervention;  Surgeon: Algernon Huxley, MD;  Location: [place] [facility];  Service: Cardiovascular;;   PERIPHERAL VASCULAR CATHETERIZATION N/A 10/09/2015   Procedure: Abdominal Aortogram w/Lower Extremity;  Surgeon: Katha Cabal, MD;  Location: [place] [facility];  Service: Cardiovascular;  Laterality: N/A;   PERIPHERAL VASCULAR CATHETERIZATION  10/09/2015   Procedure: Lower Extremity Intervention;  Surgeon: Katha Cabal, MD;  Location: [place] [facility];  Service: Cardiovascular;;   PERIPHERAL VASCULAR CATHETERIZATION Right 10/10/2015   Procedure: Lower Extremity Angiography;  Surgeon: Algernon Huxley, MD;  Location: [place] [facility];  Service: Cardiovascular;  Laterality: Right;   PERIPHERAL VASCULAR CATHETERIZATION Right 10/10/2015   Procedure: Lower Extremity Intervention;  Surgeon: Algernon Huxley, MD;  Location: [place] [facility];  Service: Cardiovascular;  Laterality: Right;   PERIPHERAL VASCULAR CATHETERIZATION Right 12/03/2015   Procedure: Lower Extremity Angiography;  Surgeon: Algernon Huxley, MD;  Location: [place] [facility];  Service: Cardiovascular;  Laterality: Right;   PERIPHERAL VASCULAR CATHETERIZATION Right 12/04/2015   Procedure: Lower Extremity Angiography;  Surgeon: Algernon Huxley, MD;  Location: [place] [facility];  Service: Cardiovascular;  Laterality: Right;   PERIPHERAL VASCULAR CATHETERIZATION  12/04/2015   Procedure: Lower Extremity Intervention;  Surgeon: Algernon Huxley, MD;  Location: [place] [facility];  Service: Cardiovascular;;   PERIPHERAL VASCULAR CATHETERIZATION Right 06/30/2016   Procedure: Lower Extremity Angiography;  Surgeon: Algernon Huxley, MD;  Location: [place] [facility];  Service: Cardiovascular;  Laterality: Right;   PERIPHERAL VASCULAR CATHETERIZATION Right 06/25/2016   Procedure: Lower Extremity Angiography;  Surgeon: Katha Cabal, MD;  Location: [place] [facility];  Service: Cardiovascular;  Laterality: Right;   PERIPHERAL VASCULAR CATHETERIZATION Right 07/01/2016   Procedure: Lower Extremity Angiography;  Surgeon: Katha Cabal, MD;  Location: [place] [facility];  Service: Cardiovascular;  Laterality: Right;   PERIPHERAL VASCULAR CATHETERIZATION  07/01/2016   Procedure: Lower Extremity Intervention;  Surgeon: Katha Cabal, MD;  Location: [place] [facility];  Service: Cardiovascular;;   PERIPHERAL VASCULAR CATHETERIZATION Right 08/01/2016   Procedure: Lower Extremity Angiography;  Surgeon: Katha Cabal, MD;  Location: [place] [facility];  Service: Cardiovascular;  Laterality: Right;   PORTA CATH INSERTION N/A 10/05/2020   Procedure: PORTA CATH INSERTION;  Surgeon: Katha Cabal, MD;  Location: [place] [facility];  Service: Cardiovascular;  Laterality: N/A;   stent placement in right leg Right    VIDEO BRONCHOSCOPY  N/A 09/14/2020   Procedure: VIDEO BRONCHOSCOPY WITH FLUORO;  Surgeon: Tyler Pita, MD;  Location: [facility];  Service: Cardiopulmonary;  Laterality: N/A;   VIDEO BRONCHOSCOPY WITH ENDOBRONCHIAL ULTRASOUND N/A 09/14/2020   Procedure: VIDEO BRONCHOSCOPY WITH ENDOBRONCHIAL ULTRASOUND;  Surgeon: Tyler Pita, MD;  Location: [facility];  Service: Cardiopulmonary;  Laterality: N/A;    SOCIAL HISTORY: Social History   Socioeconomic History   Marital status: Single    Spouse name: Not on file   Number of children: Not on file   Years of education: Not on file   Highest education level: Not on file  Occupational History   Occupation: unemployed  Tobacco Use   Smoking status: Former    Packs/day: 2.00    Years: 38.00    Pack years: 76.00    Types: Cigarettes    Quit date: 12/20/2014    Years since quitting: 6.6   Smokeless tobacco: Former   Tobacco comments:    quit smoking in 2017 most smoked 2   Vaping Use   Vaping Use: Never used  Substance and Sexual Activity   Alcohol use: Not Currently   Drug use: No   Sexual activity: Yes  Other Topics Concern   Not on file  Social History Narrative   Not on file   Social Determinants of Health   Financial Resource Strain: Not on file  Food Insecurity: Not on file  Transportation Needs: Not on file  Physical Activity: Not on file  Stress: Not on file  Social Connections: Not on file  Intimate Partner Violence: Not on file    FAMILY HISTORY: Family History  Problem Relation Age of Onset   Diabetes Mother    Hypertension Mother    Heart murmur Mother    Leukemia Mother    Throat cancer Maternal Grandmother    Colon cancer Neg Hx    Breast cancer Neg Hx     ALLERGIES:  is allergic to tape.  MEDICATIONS:  Current Outpatient Medications  Medication Sig Dispense Refill   clopidogrel (PLAVIX) 75 MG tablet TAKE 1 TABLET BY MOUTH  DAILY 90 tablet 3   ELIQUIS 5 MG TABS tablet TAKE 1 TABLET BY MOUTH  TWICE DAILY 120 tablet  5   levothyroxine (SYNTHROID) 75 MCG tablet TAKE 1 TABLET BY MOUTH ONCE DAILY BEFORE BREAKFAST 30 tablet 0   lidocaine-prilocaine (EMLA) cream Apply to affected area once (Patient taking differently: Apply 1 application topically daily as needed (port access).) 30 g 3   potassium chloride SA (KLOR-CON M) 20 MEQ tablet Take 1 tablet (20 mEq total) by mouth daily. 30 tablet 1   simvastatin (ZOCOR) 40 MG tablet Take 40 mg by mouth at bedtime.     Tiotropium Bromide-Olodaterol (STIOLTO RESPIMAT) 2.5-2.5 MCG/ACT AERS Inhale 2.5 mcg into the lungs 2 (two) times daily. 4 g 0   acetaminophen (TYLENOL) 325 MG tablet Take 650 mg by mouth every 6 (six) hours as needed for moderate pain. (Patient not taking: Reported on 08/30/2021)     gabapentin (NEURONTIN) 300 MG capsule Take 2 capsules (600 mg total) by mouth 3 (three) times daily. (Patient not taking: Reported on 08/30/2021) 540 capsule 1   No current facility-administered medications for this visit.     PHYSICAL EXAMINATION: ECOG PERFORMANCE STATUS: 1 - Symptomatic but completely ambulatory  Vitals:   08/30/21 0800  BP: 127/85  Pulse: 86  Resp: 16  Temp: (!) 97.5 F (36.4 C)  SpO2: 100%   Filed Weights   08/30/21 0800 08/30/21 0845  Weight: 206 lb (93.4 kg) 206 lb (93.4 kg)    Physical Exam Constitutional:      General: He is not in acute distress. HENT:     Head: Normocephalic and atraumatic.  Eyes:     General: No scleral icterus. Cardiovascular:     Rate and Rhythm: Normal rate and regular rhythm.     Heart sounds: Normal heart sounds.  Pulmonary:     Effort: Pulmonary effort is normal. No respiratory distress.     Breath sounds: No wheezing.  Abdominal:     General: There is no distension.     Palpations: Abdomen is soft.  Musculoskeletal:        General: No deformity. Normal range of motion.     Cervical back: Normal range of motion and neck supple.  Skin:    General: Skin is warm and dry.     Findings: No erythema or  rash.  Neurological:     Mental Status: He is alert and oriented to person, place, and time. Mental status is at baseline.     Cranial Nerves: No cranial nerve deficit.     Coordination: Coordination normal.  Psychiatric:        Mood and Affect: Mood normal.    LABORATORY DATA:  I have reviewed the data as listed Lab Results  Component Value Date   WBC 5.2 08/30/2021   HGB 12.6 (L) 08/30/2021   HCT 38.7 (L) 08/30/2021   MCV 88.4 08/30/2021   PLT 176 08/30/2021   Recent Labs    08/02/21 0818 08/16/21 0820 08/30/21 0816  NA 138 142 140  K 3.8 4.0 3.7  CL 106 107 106  CO2 $Re'26 26 25  'Izr$ GLUCOSE 117* 116* 124*  BUN 24* 22 21  CREATININE 1.22 1.36* 1.15  CALCIUM 8.5* 9.2 9.2  GFRNONAA >60 58* >60  PROT 7.0 7.4 7.1  ALBUMIN 4.2 4.1 4.0  AST $Re'24 22 19  'iiJ$ ALT $R'19 18 14  'Lo$ ALKPHOS 65 73 68  BILITOT 0.3 0.5 0.3    Iron/TIBC/Ferritin/ %Sat    Component Value Date/Time   IRON 48 08/30/2020 1052   TIBC 391 08/30/2020 1052   FERRITIN 25 08/30/2020 1052   IRONPCTSAT 12 (L) 08/30/2020 1052      RADIOGRAPHIC STUDIES: I have personally reviewed the radiological images as listed and agreed with the findings in the report. CT SOFT TISSUE NECK W CONTRAST  Result Date: 07/10/2021 CLINICAL DATA:  66 year old male with history of non-small cell right upper lobe lung cancer. Right lung radiation. Left-side palpable mass. EXAM: CT NECK WITH CONTRAST TECHNIQUE: Multidetector CT imaging of the neck was performed using the standard protocol following the bolus administration of intravenous contrast. CONTRAST:  76mL ISOVUE-300 IOPAMIDOL (ISOVUE-300) INJECTION 61% COMPARISON:  Cervical spine MRI 02/06/2020.  Chest CT 05/09/2021. FINDINGS: Pharynx and larynx: Negative larynx and pharynx. Parapharyngeal and retropharyngeal spaces are normal. Salivary glands: Negative sublingual space. Right side submandibular and parotid glands are within normal limits. Cystic mass with thin but irregular rim of soft  tissue (series 6, image 39 and series 5, image 83) measures up to 29 mm diameter and is situated between the left submandibular and inferior parotid glands in the left neck on axial series 3, image 41. The lesion is inseparable from both glands, possibly  with a "claw sign" with the submandibular gland on sagittal image 81. But the underlying left side salivary gland parenchyma elsewhere is normal. No regional inflammation. This area is not clearly visible on the 2019 cervical spine MRI. And there is no other neck cross-sectional imaging. Thyroid: Diminutive, negative. Lymph nodes: No solid cervical lymphadenopathy superimposed on the left side cystic mass detailed above. Bilateral cervical nodes are diminutive. Vascular: Right IJ Port-A-Cath is partially visible. Major vascular structures in the neck and at the skull base are patent. Left vertebral artery is dominant and the right is diminutive at the skull base. Limited intracranial: Negative. Visualized orbits: Not included. Mastoids and visualized paranasal sinuses: Visible sinuses are well aerated. There is mild right maxillary alveolar recess mucosal thickening. Visible tympanic cavities and mastoids are clear. Skeleton: Left mandible appears intact and normal near the cystic mass. Previous cervical ACDF C4 through C7 with evidence of solid arthrodesis. Other cervical spine degeneration. No acute or suspicious osseous lesion identified. Upper chest: Stable compared to the October chest CT. No superior mediastinal lymphadenopathy. IMPRESSION: 1. Left neck cystic mass with soft tissue rim is 2.9 cm and situated directly between the left submandibular and inferior parotid glands, inseparable from both. Indirect evidence the lesion may have originated from the [facility]. No regional inflammation or lymphadenopathy. Background gland parenchyma, and the right side salivary glands appear normal. 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  2. Right jugular Port-A-Cath in place. Stable visible post treatment appearance of the upper chest. Previous cervical ACDF. Electronically Signed   By: Genevie Ann M.D.   On: 07/10/2021 08:48   CT CHEST WO CONTRAST  Result Date: 08/24/2021 CLINICAL DATA:  Lung cancer.  Squamous cell carcinoma lung. EXAM: CT CHEST WITHOUT CONTRAST TECHNIQUE: Multidetector CT imaging of the chest was performed following the standard protocol without IV contrast. RADIATION DOSE REDUCTION: This exam was performed according to the departmental dose-optimization program which includes automated exposure control, adjustment of the mA and/or kV according to patient size and/or use of iterative reconstruction technique. COMPARISON:  CT 10 2022 FINDINGS: Cardiovascular: Port in the anterior chest wall with tip in distal SVC. Coronary artery calcification and aortic atherosclerotic calcification. Mediastinum/Nodes: No axillary or supraclavicular adenopathy. No mediastinal or hilar adenopathy. No pericardial fluid. Esophagus normal. Lungs/Pleura: RIGHT perihilar consolidation with air bronchograms in a pattern typical of post radiation change. No new nodularity or interval growth. Ground-glass nodule in the RIGHT upper lobe measuring 10 mm (image 50/3) is new from prior. More superior ground-glass nodule identified on comparison exam is no longer measurable. Small ground-glass nodule in the RIGHT upper lobe just anterior to the fissure (image 61/3) is also new from prior. Upper Abdomen: Limited view of the liver, kidneys, pancreas are unremarkable. Normal adrenal glands. Musculoskeletal: No aggressive osseous lesion. IMPRESSION: 1. Stable post radiation change about the RIGHT hilum. 2. Two new ground-glass nodules in the RIGHT upper lobe. Resolution of previous described more superior ground-glass nodule in the same vicinity. Waxing and waning ground-glass nodules typically indicates a benign infectious or inflammatory process. Recommend  continued surveillance of these nodules. Electronically Signed   By: Suzy Bouchard M.D.   On: 08/24/2021 09:13   Pulmonary Function Test [facility] Only  Result Date: 07/20/2021 Spirometry Data Is Acceptable and Reproducible CONSIDER MILD Moderate Obstructive Airways Disease without  Significant Broncho-Dilator Response Also CONSIDER Moderate Restrictive Lung disease Consider outpatient Pulmonary Consultation if needed Clinical Correlation Advised   [place] CORE BIOPSY (SOFT TISSUE)  Result Date: 08/07/2021 INDICATION: Enlarging left-sided parotid nodule. History of previous ultrasound-guided core needle biopsy on 11/07/2020. Please perform repeat ultrasound-guided biopsy for tissue diagnostic purposes. EXAM: ULTRASOUND-GUIDED LEFT PAROTID NODULE BIOPSY COMPARISON:  Neck CT-07/09/2021 Ultrasound-guided left parotid nodule biopsy-11/07/2020 MEDICATIONS: None ANESTHESIA/SEDATION: None COMPLICATIONS: None immediate. TECHNIQUE: Informed written consent was obtained from the patient after a discussion of the risks, benefits and alternatives to treatment. Questions regarding the procedure were encouraged and answered. Initial ultrasound scanning demonstrated unchanged size and appearance of the approximately 2.8 x 1.7 x 2.2 cm nodule adjacent to the anterior inferior aspect the left parotid gland (images 2 through 11), similar to neck CT performed 07/09/2021, image 42, series 3. An ultrasound image was saved for documentation purposes. The procedure was planned. A timeout was performed prior to the initiation of the procedure. The operative was prepped and draped in the usual sterile fashion, and a sterile drape was applied covering the operative field. A timeout was performed prior to the initiation of the procedure. Local anesthesia was provided with 1% lidocaine with epinephrine. Under direct ultrasound guidance, an 18 gauge core needle device was utilized to obtain to obtain 6 core needle biopsies of the  indeterminate left parotid gland nodule. The samples were placed in saline and submitted to pathology. The needle was removed and hemostasis was achieved with manual compression. Post procedure scan was negative for significant hematoma. A dressing was applied. The patient tolerated the procedure well without immediate postprocedural complication. IMPRESSION: Technically successful ultrasound guided biopsy of indeterminate left parotid gland nodule Electronically Signed   By: Sandi Mariscal M.D.   On: 08/07/2021 16:40       ASSESSMENT & PLAN:  1. Squamous cell carcinoma of right lung ([place])   2. 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 08/24/2021, CT chest without contrast showed stable postradiation change about the right hilum.  2 new groundglass nodules in the right upper lobe.  Resolution of previous described mostly.  Groundglass nodules in the same density.  Waxing and waning groundglass nodules typically indicates a benign infectious or inflammatory process.   We will repeat CT in 3 months.  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   Continue Synthroid 46mcg.  TSH remains elevated at 84.  Free T4 is pending.                                                           All questions were answered. The patient knows to call the clinic with any problems questions or concerns.  cc [name], MD   Return of visit: Lab MD durvalumab in 2 weeks-  [name], MD, PhD  08/30/2021

## 2021-08-30 NOTE — Telephone Encounter (Signed)
°  Component Ref Range & Units 08:16 2 wk ago 4 wk ago 1 mo ago 2 mo ago 3 mo ago 4 mo ago  Potassium 3.5 - 5.1 mmol/L 3.7  4.0  3.8  3.9  3.2 Low   3.7  3.4 Low                Component Ref Range & Units 08:16 (08/30/21) 4 wk ago (08/02/21) 3 mo ago (05/30/21) 5 mo ago (03/07/21) 6 mo ago (02/21/21) 6 mo ago (02/07/21) 7 mo ago (01/24/21)  TSH 0.350 - 4.500 uIU/mL 84.000 High   84.000 High  CM  190.000 High  CM  232.679 High  CM  261.000 High  CM  228.000 High  CM  3.618 CM     T4 Order: [id number] Status: In process    Visible to patient: No (not released)    Next appt: 09/04/2021 at 10:30 AM in Pulmonology Vernard Gambles, MD)    Dx: Squamous cell carcinoma of right lung...    0 Result Notes     Specimen Collected: 08/30/21 08:16 Last Resulted: 08/30/21 08:27

## 2021-08-31 LAB — T4: T4, Total: 6.3 ug/dL (ref 4.5–12.0)

## 2021-09-04 ENCOUNTER — Encounter: Payer: Self-pay | Admitting: Oncology

## 2021-09-04 ENCOUNTER — Other Ambulatory Visit: Payer: Self-pay

## 2021-09-04 ENCOUNTER — Encounter: Payer: Self-pay | Admitting: Pulmonary Disease

## 2021-09-04 ENCOUNTER — Ambulatory Visit: Payer: Medicare Other | Admitting: Pulmonary Disease

## 2021-09-04 VITALS — BP 122/80 | HR 90 | Temp 97.8°F | Ht 71.0 in | Wt 204.6 lb

## 2021-09-04 DIAGNOSIS — J449 Chronic obstructive pulmonary disease, unspecified: Secondary | ICD-10-CM

## 2021-09-04 DIAGNOSIS — R0609 Other forms of dyspnea: Secondary | ICD-10-CM

## 2021-09-04 DIAGNOSIS — J439 Emphysema, unspecified: Secondary | ICD-10-CM

## 2021-09-04 DIAGNOSIS — C3491 Malignant neoplasm of unspecified part of right bronchus or lung: Secondary | ICD-10-CM

## 2021-09-04 MED ORDER — STIOLTO RESPIMAT 2.5-2.5 MCG/ACT IN AERS: 2.0000 | INHALATION_SPRAY | Freq: Every day | RESPIRATORY_TRACT | 0 refills | Status: DC

## 2021-09-04 NOTE — Progress Notes (Signed)
Subjective:    Patient ID: Joshua [name], male    DOB: Nov 04, 1955, 66 y.o.   MRN: [id number] Patient Care Team: [name], MD as PCP - General (Physician Assistant) [name], MD as Referring Physician (Physician Assistant) Bary Castilla, [name], MD (General Surgery) Telford Nab, RN as Oncology Nurse Navigator [name], MD as Consulting Physician (Oncology)  Chief Complaint  Patient presents with   Follow-up   HPI Patient is a 66 year old former smoker who presents for follow-up on the issue of shortness of breath with exertion.  This is a scheduled visit.  He has a 76-pack-year history of smoking.  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  He then received concomitant chemo radiation for his stage IIIa disease.  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  He complains of dyspnea on exertion but this has been a chronic issue for him.  He had PFTs finally performed on 29 December 222 that showed a mixed pattern of restriction/obstruction the obstruction likely underestimated due to restriction.  Restriction is mild.  He does have significant small airways component.  Diffusion capacity was only mildly reduced.  He has been tried on triple therapy inhalers previously but has stopped using them after 2 to 3 days due to irritating side effects despite rinsing his mouth.  I suspect that this is due to the ICS component.  He has careful follow-up with medical oncology.   DATA 07/18/2021 PFTs: FEV1 2.80 L or 76% predicted, FVC 3.61 L or 74% predicted, there is a small airways component.  No significant bronchodilator response.  Lung volumes mildly reduced.  Diffusion capacity 71% by K[facility].    Review of Systems A 10 point review of systems was performed and it is as noted above otherwise negative.  Patient  Active Problem List   Diagnosis Date Noted   Parotid nodule 08/16/2021   Dysphagia 06/20/2021   COVID 06/20/2021   Organizing pneumonia ([place]) 04/04/2021   Hypothyroidism due to medication 04/04/2021   Encounter for antineoplastic immunotherapy 04/04/2021   Cancer ([place]) 01/14/2021   Antineoplastic chemotherapy induced anemia 10/17/2020   Squamous cell carcinoma of right lung ([place]) 09/20/2020   Goals of care, counseling/discussion 08/30/2020   Iron deficiency anemia 08/30/2020   Lung mass 08/30/2020   Ischemia of extremity 03/23/2020   Cervical myelopathy ([place]) 03/14/2020   Ischemia of right lower extremity 11/15/2019   Peripheral neuropathy 10/14/2019   Chronic left-sided low back pain without sciatica 04/13/2019   History of back surgery 04/13/2019   Overweight 04/13/2019   Atherosclerosis of lower extremity with claudication ([place]) 40/98/1191   Complication associated with vascular device 11/13/2016   Hyperlipidemia 06/18/2016   Hypertension 06/18/2016   Radiculopathy of lumbar region 06/10/2016   PVD (peripheral vascular disease) ([place]) 05/08/2016   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/03/2015   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[place]) 01/02/2015   Prediabetes 08/25/2014   Social History   Tobacco Use   Smoking status: Former    Packs/day: 2.00    Years: 38.00    Pack years: 76.00    Types: Cigarettes    Quit date: 12/20/2014    Years since quitting: 6.7   Smokeless tobacco: Former   Tobacco comments:    quit smoking in 2017 most smoked 2   Substance Use Topics  Alcohol use: Not Currently   Allergies  Allergen Reactions   Tape     Honeycomb Dressing tape- burns his skin    Current Meds  Medication Sig   clopidogrel (PLAVIX) 75 MG tablet TAKE 1 TABLET BY MOUTH  DAILY   ELIQUIS 5 MG TABS tablet TAKE 1 TABLET BY MOUTH  TWICE DAILY   levothyroxine (SYNTHROID) 75 MCG tablet TAKE 1 TABLET BY MOUTH ONCE DAILY BEFORE BREAKFAST    potassium chloride SA (KLOR-CON M) 20 MEQ tablet Take 1 tablet (20 mEq total) by mouth daily.   simvastatin (ZOCOR) 40 MG tablet Take 40 mg by mouth at bedtime.   Tiotropium Bromide-Olodaterol (STIOLTO RESPIMAT) 2.5-2.5 MCG/ACT AERS Inhale 2 puffs into the lungs daily.   Immunization History  Administered Date(s) Administered   Influenza,inj,Quad PF,6+ Mos 09/14/2016, 04/13/2019, 04/16/2020   Influenza-Unspecified 09/14/2016, 04/13/2019, 04/16/2020   PFIZER Comirnaty(Gray Top)Covid-19 Tri-Sucrose Vaccine 10/20/2019, 11/29/2019   PFIZER(Purple Top)SARS-COV-2 Vaccination 10/20/2019, 11/29/2019   Tdap 04/16/2020       Objective:   Physical Exam BP 122/80 (BP Location: Left Arm, Patient Position: Sitting, Cuff Size: Normal)   Pulse 90   Temp 97.8 F (36.6 C) (Oral)   Ht 5\' 11"  (1.803 m)   Wt 204 lb 9.6 oz (92.8 kg)   SpO2 99%   BMI 28.54 kg/m  GENERAL: Chronically ill-appearing well-developed well-nourished gentleman, no acute distress.  Fully ambulatory without device assistance today.   HEAD: Normocephalic, atraumatic.  EYES: Pupils equal, round, reactive to light.  No scleral icterus.  MOUTH: Nose/mouth/throat not examined due to masking requirements for COVID 19. NECK: Supple.  Limited ROM due to prior spinal fusion.  No thyromegaly. Trachea midline. No JVD.  No adenopathy. PULMONARY: Good air entry bilaterally.  Few rhonchi, no wheezes. CARDIOVASCULAR: S1 and S2. Regular rate and rhythm.  No rubs, murmurs or gallops heard. ABDOMEN: Benign. MUSCULOSKELETAL: No joint deformity, no clubbing, no edema.  NEUROLOGIC: No overt focal deficit, walks with assistance of a cane, speech is fluent. SKIN: Intact,warm,dry. PSYCH: Taciturn, behavior normal.     Assessment & Plan:     ICD-10-CM   1. COPD with chronic bronchitis and emphysema (HCC)  J44.9    Trial of Stiolto 2 inhalations daily Samples provided for the patient    2. Squamous cell carcinoma of right lung (HCC)  C34.91     Currently on Durvalumab therapy Follow-up with oncology This issue adds complexity to his management    3. Dyspnea on exertion  R06.09    Chronic issue for the patient Unchanged     Meds ordered this encounter  Medications   Tiotropium Bromide-Olodaterol (STIOLTO RESPIMAT) 2.5-2.5 MCG/ACT AERS    Sig: Inhale 2 puffs into the lungs daily.    Dispense:  4 g    Refill:  0    Order Specific Question:   Lot Number?    Answer:   [id number]    Order Specific Question:   Expiration Date?    Answer:   10/19/2023    Order Specific Question:   Quantity    Answer:   2    We will see the patient in follow-up in 3 months time he is to contact us prior to that time should any new difficulties arise.  Renold Don, MD Advanced Bronchoscopy P[facility]    *This note was dictated using voice recognition software/Dragon.  Despite best efforts to proofread, errors can occur which can change the meaning. Any transcriptional errors that result from this  process are unintentional and may not be fully corrected at the time of dictation.

## 2021-09-04 NOTE — Patient Instructions (Signed)
Continue using your Stiolto 2 puffs daily.  Provided you with more samples today.   We will see you in follow-up 3 months time call sooner should any new problems arise.

## 2021-09-06 ENCOUNTER — Encounter: Payer: Self-pay | Admitting: Gastroenterology

## 2021-09-08 ENCOUNTER — Encounter: Payer: Self-pay | Admitting: Gastroenterology

## 2021-09-08 NOTE — H&P (Signed)
Pre-Procedure H&P   Patient ID: Joshua [name] is a 66 y.o. male.  Gastroenterology Provider: [name], DO  PCP: [name], MD  Date: 09/09/2021  HPI [name] is a 66 y.o. male who presents today for Esophagogastroduodenoscopy and Colonoscopy for Dysphagia, screening colonoscopy. Patient with SCC R Lung CA status post systemic chemotherapy and radiation and currently on immunotherapy followed by oncology. Patient complains of dysphagia to solids and pills that started after his C-spine surgery in September 2021.  He notes regurgitation and globus sensation even despite not eating.  He denies any history of food impaction, odynophagia or reflux symptoms.  Notes suprasternal sticking especially with foods that are not well chewed.  Notes most symptoms with breads in the situations. Last colonoscopy in 2012 noting sigmoid diverticulosis.  Currently bowel movements are normal without melena hematochezia diarrhea or constipation.  Underwent PET scan without enhancement noted in the colon.  Plavix and Eliquis held since Thursday for this procedure Heart catheterization without obstruction in October 2022  Past Medical History:  Diagnosis Date   Allergy    Cancer [facility])    lung   Dyspnea    former smoker. only doe   History of kidney stones    Hypertension    Peripheral vascular disease ([place])    Pre-diabetes    Sleep apnea     Past Surgical History:  Procedure Laterality Date   ANTERIOR CERVICAL DECOMP/DISCECTOMY FUSION N/A 03/14/2020   Procedure: ANTERIOR CERVICAL DECOMPRESSION/DISCECTOMY FUSION 3 LEVELS C4-7;  Surgeon: Meade Maw, MD;  Location: [facility];  Service: Neurosurgery;  Laterality: N/A;   BACK SURGERY  2011   CENTRAL LINE INSERTION Right 09/10/2016   Procedure: CENTRAL LINE INSERTION;  Surgeon: Katha Cabal, MD;  Location: [facility];  Service: Vascular;  Laterality: Right;   CERVICAL FUSION     C4-c7 on Mar 14, 2020    COLONOSCOPY  2013 ?   CORONARY ANGIOPLASTY     FEMORAL-POPLITEAL BYPASS GRAFT Right 09/10/2016   Procedure: BYPASS GRAFT FEMORAL-POPLITEAL ARTERY ( BELOW KNEE );  Surgeon: Katha Cabal, MD;  Location: [facility];  Service: Vascular;  Laterality: Right;   FEMORAL-TIBIAL BYPASS GRAFT Right 03/23/2020   Procedure: BYPASS GRAFT RIGHT FEMORAL- DISTAL TIBIAL ARTERY WITH DISTAL FLOW GRAFT;  Surgeon: Katha Cabal, MD;  Location: [facility];  Service: Vascular;  Laterality: Right;   LEFT HEART CATH AND CORONARY ANGIOGRAPHY Left 05/16/2021   Procedure: LEFT HEART CATH AND CORONARY ANGIOGRAPHY;  Surgeon: Andrez Grime, MD;  Location: [place] [facility];  Service: Cardiovascular;  Laterality: Left;   LOWER EXTREMITY ANGIOGRAPHY Right 11/18/2016   Procedure: Lower Extremity Angiography;  Surgeon: Katha Cabal, MD;  Location: [place] [facility];  Service: Cardiovascular;  Laterality: Right;   LOWER EXTREMITY ANGIOGRAPHY Right 12/09/2016   Procedure: Lower Extremity Angiography;  Surgeon: Katha Cabal, MD;  Location: [place] [facility];  Service: Cardiovascular;  Laterality: Right;   LOWER EXTREMITY ANGIOGRAPHY Right 11/15/2019   Procedure: LOWER EXTREMITY ANGIOGRAPHY;  Surgeon: Katha Cabal, MD;  Location: [place] [facility];  Service: Cardiovascular;  Laterality: Right;   LOWER EXTREMITY ANGIOGRAPHY Right 12/01/2019   Procedure: Lower Extremity Angiography;  Surgeon: Algernon Huxley, MD;  Location: [place] [facility];  Service: Cardiovascular;  Laterality: Right;   LOWER EXTREMITY ANGIOGRAPHY Right 12/02/2019   Procedure: LOWER EXTREMITY ANGIOGRAPHY;  Surgeon: Katha Cabal, MD;  Location: [place] [facility];  Service: Cardiovascular;  Laterality: Right;   LOWER EXTREMITY ANGIOGRAPHY Right 03/23/2020   Procedure: Lower Extremity Angiography;  Surgeon: Katha Cabal, MD;  Location: [place] [facility];  Service: Cardiovascular;  Laterality: Right;   LOWER EXTREMITY  INTERVENTION  11/18/2016   Procedure: Lower Extremity Intervention;  Surgeon: Katha Cabal, MD;  Location: [place] [facility];  Service: Cardiovascular;;   PERIPHERAL VASCULAR CATHETERIZATION Right 01/02/2015   Procedure: Lower Extremity Angiography;  Surgeon: Katha Cabal, MD;  Location: [place] [facility];  Service: Cardiovascular;  Laterality: Right;   PERIPHERAL VASCULAR CATHETERIZATION Right 06/18/2015   Procedure: Lower Extremity Angiography;  Surgeon: Algernon Huxley, MD;  Location: [place] [facility];  Service: Cardiovascular;  Laterality: Right;   PERIPHERAL VASCULAR CATHETERIZATION  06/18/2015   Procedure: Lower Extremity Intervention;  Surgeon: Algernon Huxley, MD;  Location: [place] [facility];  Service: Cardiovascular;;   PERIPHERAL VASCULAR CATHETERIZATION N/A 10/09/2015   Procedure: Abdominal Aortogram w/Lower Extremity;  Surgeon: Katha Cabal, MD;  Location: [place] [facility];  Service: Cardiovascular;  Laterality: N/A;   PERIPHERAL VASCULAR CATHETERIZATION  10/09/2015   Procedure: Lower Extremity Intervention;  Surgeon: Katha Cabal, MD;  Location: [place] [facility];  Service: Cardiovascular;;   PERIPHERAL VASCULAR CATHETERIZATION Right 10/10/2015   Procedure: Lower Extremity Angiography;  Surgeon: Algernon Huxley, MD;  Location: [place] [facility];  Service: Cardiovascular;  Laterality: Right;   PERIPHERAL VASCULAR CATHETERIZATION Right 10/10/2015   Procedure: Lower Extremity Intervention;  Surgeon: Algernon Huxley, MD;  Location: [place] [facility];  Service: Cardiovascular;  Laterality: Right;   PERIPHERAL VASCULAR CATHETERIZATION Right 12/03/2015   Procedure: Lower Extremity Angiography;  Surgeon: Algernon Huxley, MD;  Location: [place] [facility];  Service: Cardiovascular;  Laterality: Right;   PERIPHERAL VASCULAR CATHETERIZATION Right 12/04/2015   Procedure: Lower Extremity Angiography;  Surgeon: Algernon Huxley, MD;  Location: [place] [facility];  Service:  Cardiovascular;  Laterality: Right;   PERIPHERAL VASCULAR CATHETERIZATION  12/04/2015   Procedure: Lower Extremity Intervention;  Surgeon: Algernon Huxley, MD;  Location: [place] [facility];  Service: Cardiovascular;;   PERIPHERAL VASCULAR CATHETERIZATION Right 06/30/2016   Procedure: Lower Extremity Angiography;  Surgeon: Algernon Huxley, MD;  Location: [place] [facility];  Service: Cardiovascular;  Laterality: Right;   PERIPHERAL VASCULAR CATHETERIZATION Right 06/25/2016   Procedure: Lower Extremity Angiography;  Surgeon: Katha Cabal, MD;  Location: [place] [facility];  Service: Cardiovascular;  Laterality: Right;   PERIPHERAL VASCULAR CATHETERIZATION Right 07/01/2016   Procedure: Lower Extremity Angiography;  Surgeon: Katha Cabal, MD;  Location: [place] [facility];  Service: Cardiovascular;  Laterality: Right;   PERIPHERAL VASCULAR CATHETERIZATION  07/01/2016   Procedure: Lower Extremity Intervention;  Surgeon: Katha Cabal, MD;  Location: [place] [facility];  Service: Cardiovascular;;   PERIPHERAL VASCULAR CATHETERIZATION Right 08/01/2016   Procedure: Lower Extremity Angiography;  Surgeon: Katha Cabal, MD;  Location: [place] [facility];  Service: Cardiovascular;  Laterality: Right;   PORTA CATH INSERTION N/A 10/05/2020   Procedure: PORTA CATH INSERTION;  Surgeon: Katha Cabal, MD;  Location: [place] [facility];  Service: Cardiovascular;  Laterality: N/A;   stent placement in right leg Right    VIDEO BRONCHOSCOPY N/A 09/14/2020   Procedure: VIDEO BRONCHOSCOPY WITH FLUORO;  Surgeon: Tyler Pita, MD;  Location: [facility];  Service: Cardiopulmonary;  Laterality: N/A;   VIDEO BRONCHOSCOPY WITH ENDOBRONCHIAL ULTRASOUND N/A 09/14/2020   Procedure: VIDEO BRONCHOSCOPY  WITH ENDOBRONCHIAL ULTRASOUND;  Surgeon: Tyler Pita, MD;  Location: [facility];  Service: Cardiopulmonary;  Laterality: N/A;    Family History No h/o GI disease or malignancy  Review of  Systems  Constitutional:  Negative for activity change, appetite change, chills, diaphoresis, fatigue, fever and unexpected weight change.  HENT:  Positive for trouble swallowing. Negative for voice change.   Respiratory:  Negative for shortness of breath and wheezing.   Cardiovascular:  Negative for chest pain, palpitations and leg swelling.  Gastrointestinal:  Negative for abdominal distention, abdominal pain, anal bleeding, blood in stool, constipation, diarrhea, nausea and vomiting.  Musculoskeletal:  Negative for arthralgias and myalgias.  Skin:  Negative for color change and pallor.  Neurological:  Negative for dizziness, syncope and weakness.  Psychiatric/Behavioral:  Negative for confusion. The patient is not nervous/anxious.   All other systems reviewed and are negative.   Medications No current facility-administered medications on file prior to encounter.   Current Outpatient Medications on File Prior to Encounter  Medication Sig Dispense Refill   acetaminophen (TYLENOL) 325 MG tablet Take 650 mg by mouth every 6 (six) hours as needed for moderate pain. (Patient not taking: Reported on 08/30/2021)     clopidogrel (PLAVIX) 75 MG tablet TAKE 1 TABLET BY MOUTH  DAILY 90 tablet 3   gabapentin (NEURONTIN) 300 MG capsule Take 2 capsules (600 mg total) by mouth 3 (three) times daily. (Patient not taking: Reported on 08/30/2021) 540 capsule 1   lidocaine-prilocaine (EMLA) cream Apply to affected area once (Patient taking differently: Apply 1 application topically daily as needed (port access).) 30 g 3   simvastatin (ZOCOR) 40 MG tablet Take 40 mg by mouth at bedtime.      Pertinent medications related to GI and procedure were reviewed by me with the patient prior to the procedure   Current Facility-Administered Medications:    0.9 %  sodium chloride infusion, , Intravenous, Continuous, [name], DO, Last Rate: 20 mL/hr at 09/09/21 0802, New Bag at 09/09/21 0802       Allergies  Allergen Reactions   Tape     Honeycomb Dressing tape- burns his skin    Allergies were reviewed by me prior to the procedure  Objective    Vitals:   09/09/21 0740  BP: 139/87  Pulse: (!) 108  Resp: 20  Temp: (!) 97.2 F (36.2 C)  TempSrc: Temporal  SpO2: 100%  Weight: 90.7 kg  Height: 5\' 11"  (1.803 m)     Physical Exam Vitals and nursing note reviewed.  Constitutional:      General: He is not in acute distress.    Appearance: Normal appearance. He is not ill-appearing, toxic-appearing or diaphoretic.  HENT:     Head: Normocephalic and atraumatic.     Nose: Nose normal.     Mouth/Throat:     Mouth: Mucous membranes are moist.     Pharynx: Oropharynx is clear.     Comments: Chipped upper tooth Eyes:     General: No scleral icterus.    Extraocular Movements: Extraocular movements intact.  Cardiovascular:     Rate and Rhythm: Regular rhythm. Tachycardia present.     Heart sounds: Normal heart sounds. No murmur heard.   No friction rub. No gallop.  Pulmonary:     Effort: Pulmonary effort is normal. No respiratory distress.     Breath sounds: Normal breath sounds. No wheezing, rhonchi or rales.  Abdominal:     General: Bowel sounds are normal. There is  no distension.     Palpations: Abdomen is soft.     Tenderness: There is no abdominal tenderness. There is no guarding or rebound.  Musculoskeletal:     Cervical back: Neck supple.     Right lower leg: No edema.     Left lower leg: No edema.  Skin:    General: Skin is warm and dry.     Coloration: Skin is not jaundiced or pale.  Neurological:     General: No focal deficit present.     Mental Status: He is alert and oriented to person, place, and time. Mental status is at baseline.  Psychiatric:        Mood and Affect: Mood normal.        Behavior: Behavior normal.        Thought Content: Thought content normal.        Judgment: Judgment normal.     Assessment:  [name] is a  67 y.o. male  who presents today for Esophagogastroduodenoscopy and Colonoscopy for Dysphagia, screening colonoscopy.  Plan:  Esophagogastroduodenoscopy and Colonoscopy with possible intervention today  Esophagogastroduodenoscopy and Colonoscopy with possible biopsy, control of bleeding, polypectomy, and interventions as necessary has been discussed with the patient/patient representative. Informed consent was obtained from the patient/patient representative after explaining the indication, nature, and risks of the procedure including but not limited to death, bleeding, perforation, missed neoplasm/lesions, cardiorespiratory compromise, and reaction to medications. Opportunity for questions was given and appropriate answers were provided. Patient/patient representative has verbalized understanding is amenable to undergoing the procedure.   [name], DO  [facility] Gastroenter[facility]  Portions of the record may have been created with voice recognition software. Occasional wrong-word or 'sound-a-like' substitutions may have occurred due to the inherent limitations of voice recognition software.  Read the chart carefully and recognize, using context, where substitutions may have occurred.

## 2021-09-09 ENCOUNTER — Ambulatory Visit: Payer: Medicare Other | Admitting: Registered Nurse

## 2021-09-09 ENCOUNTER — Encounter: Payer: Self-pay | Admitting: Gastroenterology

## 2021-09-09 ENCOUNTER — Ambulatory Visit
Admission: RE | Admit: 2021-09-09 | Discharge: 2021-09-09 | Disposition: A | Discharge status: Home / Self Care | Payer: Medicare Other | Attending: Gastroenterology | Admitting: Gastroenterology

## 2021-09-09 ENCOUNTER — Encounter
Admission: RE | Disposition: A | Discharge status: Home / Self Care | Payer: Self-pay | Source: Home / Self Care | Attending: Gastroenterology

## 2021-09-09 DIAGNOSIS — K5730 Diverticulosis of large intestine without perforation or abscess without bleeding: Secondary | ICD-10-CM | POA: Insufficient documentation

## 2021-09-09 DIAGNOSIS — K319 Disease of stomach and duodenum, unspecified: Secondary | ICD-10-CM | POA: Diagnosis not present

## 2021-09-09 DIAGNOSIS — D120 Benign neoplasm of cecum: Secondary | ICD-10-CM | POA: Insufficient documentation

## 2021-09-09 DIAGNOSIS — K2289 Other specified disease of esophagus: Secondary | ICD-10-CM | POA: Diagnosis not present

## 2021-09-09 DIAGNOSIS — C3491 Malignant neoplasm of unspecified part of right bronchus or lung: Secondary | ICD-10-CM | POA: Diagnosis not present

## 2021-09-09 DIAGNOSIS — I10 Essential (primary) hypertension: Secondary | ICD-10-CM | POA: Diagnosis not present

## 2021-09-09 DIAGNOSIS — I739 Peripheral vascular disease, unspecified: Secondary | ICD-10-CM | POA: Insufficient documentation

## 2021-09-09 DIAGNOSIS — Z1211 Encounter for screening for malignant neoplasm of colon: Secondary | ICD-10-CM | POA: Insufficient documentation

## 2021-09-09 DIAGNOSIS — K2980 Duodenitis without bleeding: Secondary | ICD-10-CM | POA: Insufficient documentation

## 2021-09-09 DIAGNOSIS — D123 Benign neoplasm of transverse colon: Secondary | ICD-10-CM | POA: Diagnosis not present

## 2021-09-09 DIAGNOSIS — Z9221 Personal history of antineoplastic chemotherapy: Secondary | ICD-10-CM | POA: Insufficient documentation

## 2021-09-09 DIAGNOSIS — K644 Residual hemorrhoidal skin tags: Secondary | ICD-10-CM | POA: Insufficient documentation

## 2021-09-09 DIAGNOSIS — K635 Polyp of colon: Secondary | ICD-10-CM | POA: Diagnosis not present

## 2021-09-09 DIAGNOSIS — R1310 Dysphagia, unspecified: Secondary | ICD-10-CM | POA: Diagnosis not present

## 2021-09-09 DIAGNOSIS — G4730 Sleep apnea, unspecified: Secondary | ICD-10-CM | POA: Diagnosis not present

## 2021-09-09 HISTORY — PX: COLONOSCOPY: SHX5424

## 2021-09-09 HISTORY — PX: ESOPHAGOGASTRODUODENOSCOPY: SHX5428

## 2021-09-09 SURGERY — COLONOSCOPY
Anesthesia: General

## 2021-09-09 MED ORDER — PROPOFOL 500 MG/50ML IV EMUL
INTRAVENOUS | Status: AC
  Filled 2021-09-09: qty 50

## 2021-09-09 MED ORDER — PROPOFOL 500 MG/50ML IV EMUL
INTRAVENOUS | Status: AC
  Filled 2021-09-09: qty 250

## 2021-09-09 MED ORDER — PHENYLEPHRINE 40 MCG/ML (10ML) SYRINGE FOR IV PUSH (FOR BLOOD PRESSURE SUPPORT)
PREFILLED_SYRINGE | INTRAVENOUS | Status: AC
  Filled 2021-09-09: qty 10

## 2021-09-09 MED ORDER — SODIUM CHLORIDE 0.9 % IV SOLN: INTRAVENOUS | Status: DC

## 2021-09-09 MED ORDER — PROPOFOL 10 MG/ML IV BOLUS
INTRAVENOUS | Status: DC | PRN
Start: 2021-09-09 — End: 2021-09-09
  Administered 2021-09-09: 80 mg via INTRAVENOUS

## 2021-09-09 MED ORDER — EPHEDRINE SULFATE (PRESSORS) 50 MG/ML IJ SOLN
INTRAMUSCULAR | Status: DC | PRN
  Administered 2021-09-09: 10 mg via INTRAVENOUS
  Administered 2021-09-09: 5 mg via INTRAVENOUS
  Administered 2021-09-09: 10 mg via INTRAVENOUS
  Administered 2021-09-09: 15 mg via INTRAVENOUS

## 2021-09-09 MED ORDER — DEXMEDETOMIDINE HCL 200 MCG/2ML IV SOLN
INTRAVENOUS | Status: DC | PRN
Start: 2021-09-09 — End: 2021-09-09
  Administered 2021-09-09: 20 ug via INTRAVENOUS

## 2021-09-09 MED ORDER — EPHEDRINE 5 MG/ML INJ
INTRAVENOUS | Status: AC
  Filled 2021-09-09: qty 5

## 2021-09-09 MED ORDER — PHENYLEPHRINE HCL (PRESSORS) 10 MG/ML IV SOLN
INTRAVENOUS | Status: DC | PRN
  Administered 2021-09-09: 240 ug via INTRAVENOUS
  Administered 2021-09-09: 160 ug via INTRAVENOUS
  Administered 2021-09-09 (×3): 80 ug via INTRAVENOUS
  Administered 2021-09-09 (×2): 160 ug via INTRAVENOUS

## 2021-09-09 MED ORDER — LIDOCAINE HCL (CARDIAC) PF 100 MG/5ML IV SOSY
PREFILLED_SYRINGE | INTRAVENOUS | Status: DC | PRN
  Administered 2021-09-09: 100 mg via INTRAVENOUS

## 2021-09-09 MED ORDER — PROPOFOL 500 MG/50ML IV EMUL
INTRAVENOUS | Status: DC | PRN
  Administered 2021-09-09: 150 ug/kg/min via INTRAVENOUS

## 2021-09-09 NOTE — Op Note (Signed)
[facility] Gastroenterology Patient Name: Joshua [name] Procedure Date: 09/09/2021 8:14 AM MRN: [id number] Account #: [id number] Date of Birth: [birth year] Admit Type: Outpatient Age: 66 Room: [facility] ENDO ROOM 2 Gender: Male Note Status: Finalized Instrument Name: Upper Endoscope [id number] Procedure:             Upper GI endoscopy Indications:           Dysphagia Providers:             [name] DO, DO Referring MD:          Precious Bard, MD (Referring MD) Medicines:             Monitored Anesthesia Care Complications:         No immediate complications. Estimated blood loss:                         Minimal. Procedure:             Pre-Anesthesia Assessment:                        - Prior to the procedure, a History and Physical was                         performed, and patient medications and allergies were                         reviewed. The patient is competent. The risks and                         benefits of the procedure and the sedation options and                         risks were discussed with the patient. All questions                         were answered and informed consent was obtained.                         Patient identification and proposed procedure were                         verified by the physician, the nurse, the anesthetist                         and the technician in the endoscopy suite. Mental                         Status Examination: alert and oriented. Airway                         Examination: normal oropharyngeal airway and neck                         mobility. Respiratory Examination: clear to                         auscultation. CV Examination: RRR, no murmurs, no S3  or S4. Prophylactic Antibiotics: The patient does not                         require prophylactic antibiotics. Prior                         Anticoagulants: The patient has taken Eliquis                          (apixaban), last dose was 4 days prior to procedure.                         ASA Grade Assessment: III - A patient with severe                         systemic disease. After reviewing the risks and                         benefits, the patient was deemed in satisfactory                         condition to undergo the procedure. The anesthesia                         plan was to use monitored anesthesia care (MAC).                         Immediately prior to administration of medications,                         the patient was re-assessed for adequacy to receive                         sedatives. The heart rate, respiratory rate, oxygen                         saturations, blood pressure, adequacy of pulmonary                         ventilation, and response to care were monitored                         throughout the procedure. The physical status of the                         patient was re-assessed after the procedure.                        After obtaining informed consent, the endoscope was                         passed under direct vision. Throughout the procedure,                         the patient's blood pressure, pulse, and oxygen                         saturations were monitored continuously. The Endoscope  was introduced through the mouth, and advanced to the                         second part of duodenum. The upper GI endoscopy was                         accomplished without difficulty. The patient tolerated                         the procedure well. Findings:      Localized mild inflammation characterized by erythema was found in the       duodenal bulb. Estimated blood loss: none.      The exam of the duodenum was otherwise normal.      The entire examined stomach was normal. Biopsies were taken with a cold       forceps for Helicobacter pylori testing. Estimated blood loss was       minimal.      The Z-line was irregular. Biopsies were  taken with a cold forceps for       histology. C0M1 Estimated blood loss was minimal.      Esophagogastric landmarks were identified: the gastroesophageal junction       was found at 40 cm from the incisors.      The examined esophagus was normal. The scope was withdrawn. Dilation was       performed with a Maloney dilator with no resistance at 52 Fr. The       dilation site was examined following endoscope reinsertion and showed no       change. Estimated blood loss: none.      The exam of the esophagus was otherwise normal. Impression:            - Duodenitis.                        - Normal stomach. Biopsied.                        - Z-line irregular. Biopsied.                        - Esophagogastric landmarks identified.                        - Normal esophagus. Dilated. Recommendation:        - Discharge patient to home.                        - Soft diet today.                        - Resume Eliquis (apixaban) in 2 days and Plavix                         (clopidogrel) in 2 days at prior doses. Refer to                         referring physician for further adjustment of therapy.                        - Continue present medications.                        -  Await pathology results.                        - Return to GI clinic as previously scheduled.                        - The findings and recommendations were discussed with                         the patient. Procedure Code(s):     --- Professional ---                        [phone], Esophagogastroduodenoscopy, flexible,                         transoral; with biopsy, single or multiple                        43450, Dilation of esophagus, by unguided sound or                         bougie, single or multiple passes Diagnosis Code(s):     --- Professional ---                        K29.80, Duodenitis without bleeding                        K22.8, Other specified diseases of esophagus                        R13.10,  Dysphagia, unspecified CPT copyright 2019 American Medical Association. All rights reserved. The codes documented in this report are preliminary and upon coder review may  be revised to meet current compliance requirements. Attending Participation:      I personally performed the entire procedure. Volney American, DO [name] DO, DO 09/09/2021 8:40:42 AM This report has been signed electronically. Number of Addenda: 0 Note Initiated On: 09/09/2021 8:14 AM Estimated Blood Loss:  Estimated blood loss was minimal.      [facility]

## 2021-09-09 NOTE — Interval H&P Note (Signed)
History and Physical Interval Note: Preprocedure H&P from 09/09/21  was reviewed and there was no interval change after seeing and examining the patient.  Written consent was obtained from the patient after discussion of risks, benefits, and alternatives. Patient has consented to proceed with Esophagogastroduodenoscopy and Colonoscopy with possible intervention   09/09/2021 8:20 AM  Joshua [name]  has presented today for surgery, with the diagnosis of Dysphagia, unspecified type (R13.10) Screen for colon cancer (Z12.11).  The various methods of treatment have been discussed with the patient and family. After consideration of risks, benefits and other options for treatment, the patient has consented to  Procedure(s): COLONOSCOPY (N/A) ESOPHAGOGASTRODUODENOSCOPY (EGD) (N/A) as a surgical intervention.  The patient's history has been reviewed, patient examined, no change in status, stable for surgery.  I have reviewed the patient's chart and labs.  Questions were answered to the patient's satisfaction.     [name]

## 2021-09-09 NOTE — Anesthesia Preprocedure Evaluation (Signed)
Anesthesia Evaluation  Patient identified by MRN, date of birth, ID band Patient awake    Reviewed: Allergy & Precautions, NPO status   History of Anesthesia Complications Negative for: history of anesthetic complications  Airway Mallampati: I       Dental  (+) Teeth Intact, Dental Advidsory Given, Caps Permanent bridge on bottom right:   Pulmonary shortness of breath and with exertion, sleep apnea , neg COPD, neg recent URI, former smoker,    breath sounds clear to auscultation       Cardiovascular Exercise Tolerance: Good hypertension, Pt. on medications (-) angina+ Peripheral Vascular Disease  (-) Past MI and (-) Cardiac Stents (-) dysrhythmias (-) Valvular Problems/Murmurs Rhythm:Regular     Neuro/Psych neg Seizures  Neuromuscular disease negative psych ROS   GI/Hepatic negative GI ROS, Neg liver ROS,   Endo/Other  neg diabetesHypothyroidism   Renal/GU Renal disease (kidney stones)     Musculoskeletal negative musculoskeletal ROS (+)   Abdominal Normal abdominal exam  (+)   Peds  Hematology negative hematology ROS (+)   Anesthesia Other Findings Past Medical History: No date: Allergy No date: Dyspnea     Comment:  former smoker. only doe No date: History of kidney stones No date: Peripheral vascular disease (HCC)   Reproductive/Obstetrics                             Anesthesia Physical  Anesthesia Plan  ASA: II  Anesthesia Plan: General   Post-op Pain Management:    Induction: Intravenous  PONV Risk Score and Plan: 2 and Propofol infusion and TIVA  Airway Management Planned: Natural Airway and Nasal Cannula  Additional Equipment:   Intra-op Plan:   Post-operative Plan:   Informed Consent: I have reviewed the patients History and Physical, chart, labs and discussed the procedure including the risks, benefits and alternatives for the proposed anesthesia with the  patient or authorized representative who has indicated his/her understanding and acceptance.       Plan Discussed with: CRNA  Anesthesia Plan Comments:         Anesthesia Quick Evaluation

## 2021-09-09 NOTE — Op Note (Signed)
[facility] Gastroenterology Patient Name: Joshua [name] Procedure Date: 09/09/2021 8:13 AM MRN: [id number] Account #: [id number] Date of Birth: 1956-02-27 Admit Type: Outpatient Age: 66 Room: [facility] ENDO ROOM 2 Gender: Male Note Status: Finalized Instrument Name: Colonoscope [id number] Procedure:             Colonoscopy Indications:           Screening for colorectal malignant neoplasm Providers:             [name] DO, DO Referring MD:          Precious Bard, MD (Referring MD) Medicines:             Monitored Anesthesia Care Complications:         No immediate complications. Estimated blood loss:                         Minimal. Procedure:             Pre-Anesthesia Assessment:                        - Prior to the procedure, a History and Physical was                         performed, and patient medications and allergies were                         reviewed. The patient is competent. The risks and                         benefits of the procedure and the sedation options and                         risks were discussed with the patient. All questions                         were answered and informed consent was obtained.                         Patient identification and proposed procedure were                         verified by the physician, the nurse, the anesthetist                         and the technician in the endoscopy suite. Mental                         Status Examination: alert and oriented. Airway                         Examination: normal oropharyngeal airway and neck                         mobility. Respiratory Examination: clear to                         auscultation. CV Examination: RRR, no murmurs, no S3  or S4. Prophylactic Antibiotics: The patient does not                         require prophylactic antibiotics. Prior                         Anticoagulants: The patient has taken Eliquis                          (apixaban), last dose was 4 days prior to procedure.                         ASA Grade Assessment: III - A patient with severe                         systemic disease. After reviewing the risks and                         benefits, the patient was deemed in satisfactory                         condition to undergo the procedure. The anesthesia                         plan was to use monitored anesthesia care (MAC).                         Immediately prior to administration of medications,                         the patient was re-assessed for adequacy to receive                         sedatives. The heart rate, respiratory rate, oxygen                         saturations, blood pressure, adequacy of pulmonary                         ventilation, and response to care were monitored                         throughout the procedure. The physical status of the                         patient was re-assessed after the procedure.                        After obtaining informed consent, the colonoscope was                         passed under direct vision. Throughout the procedure,                         the patient's blood pressure, pulse, and oxygen                         saturations were monitored continuously. The  Colonoscope was introduced through the anus and                         advanced to the the cecum, identified by appendiceal                         orifice and ileocecal valve. The colonoscopy was                         performed without difficulty. The patient tolerated                         the procedure well. The quality of the bowel                         preparation was evaluated using the BBPS [facility] Bowel                         Preparation Scale) with scores of: Right Colon = 3,                         Transverse Colon = 3 and Left Colon = 3 (entire mucosa                         seen well with no residual staining,  small fragments                         of stool or opaque liquid). The total BBPS score                         equals 9. The ileocecal valve, appendiceal orifice,                         and rectum were photographed. Findings:      Hemorrhoids were found on perianal exam.      The digital rectal exam was normal. Pertinent negatives include normal       sphincter tone.      Many small-mouthed diverticula were found in the left colon. Estimated       blood loss: none.      A 4 to 5 mm polyp was found in the cecum. The polyp was sessile. The       polyp was removed with a cold snare. Resection and retrieval were       complete. Estimated blood loss was minimal. To prevent bleeding after       the polypectomy, one hemostatic clip was successfully placed (MR       conditional). There was no bleeding during, or at the end, of the       procedure. Estimated blood loss: none.      Two sessile polyps were found in the sigmoid colon and transverse colon.       The polyps were 1 to 2 mm in size. These polyps were removed with a cold       biopsy forceps. Resection and retrieval were complete. Estimated blood       loss was minimal.      Non-bleeding external and internal hemorrhoids were found during       retroflexion. Estimated blood loss:  none.      The exam was otherwise without abnormality on direct and retroflexion       views. Impression:            - Hemorrhoids found on perianal exam.                        - Diverticulosis in the left colon.                        - One 4 to 5 mm polyp in the cecum, removed with a                         cold snare. Resected and retrieved. Clip (MR                         conditional) was placed.                        - Two 1 to 2 mm polyps in the sigmoid colon and in the                         transverse colon, removed with a cold biopsy forceps.                         Resected and retrieved.                        - Non-bleeding external and  internal hemorrhoids.                        - The examination was otherwise normal on direct and                         retroflexion views. Recommendation:        - Discharge patient to home.                        - Soft diet today.                        - No aspirin, ibuprofen, naproxen, or other                         non-steroidal anti-inflammatory drugs for 5 days after                         polyp removal.                        - Resume Eliquis (apixaban) in 2 days and Plavix                         (clopidogrel) in 2 days at prior doses. Refer to                         referring physician for further adjustment of therapy.                        - Await pathology results.                        -  Continue present medications.                        - Repeat colonoscopy for surveillance based on                         pathology results.                        - Return to GI clinic as previously scheduled.                        - The findings and recommendations were discussed with                         the patient. Procedure Code(s):     --- Professional ---                        [phone], Colonoscopy, flexible; with removal of                         tumor(s), polyp(s), or other lesion(s) by snare                         technique                        45380, 53, Colonoscopy, flexible; with biopsy, single                         or multiple Diagnosis Code(s):     --- Professional ---                        K63.5, Polyp of colon                        Z12.11, Encounter for screening for malignant neoplasm                         of colon                        K64.8, Other hemorrhoids                        K57.30, Diverticulosis of large intestine without                         perforation or abscess without bleeding CPT copyright 2019 American Medical Association. All rights reserved. The codes documented in this report are preliminary and upon coder review may  be  revised to meet current compliance requirements. Attending Participation:      I personally performed the entire procedure. Volney American, DO [name] DO, DO 09/09/2021 9:15:38 AM This report has been signed electronically. Number of Addenda: 0 Note Initiated On: 09/09/2021 8:13 AM Scope Withdrawal Time: 0 hours 19 minutes 16 seconds  Total Procedure Duration: 0 hours 27 minutes 9 seconds  Estimated Blood Loss:  Estimated blood loss was minimal.      [facility]

## 2021-09-09 NOTE — Transfer of Care (Signed)
Immediate Anesthesia Transfer of Care Note  Patient: Joshua [name]  Procedure(s) Performed: COLONOSCOPY ESOPHAGOGASTRODUODENOSCOPY (EGD)  Patient Location: Endoscopy Unit  Anesthesia Type:General  Level of Consciousness: drowsy  Airway & Oxygen Therapy: Patient Spontanous Breathing  Post-op Assessment: Report given to RN and Post -op Vital signs reviewed and stable  Post vital signs: Reviewed and stable  Last Vitals:  Vitals Value Taken Time  BP 83/46 09/09/21 0916  Temp    Pulse 70 09/09/21 0918  Resp 13 09/09/21 0918  SpO2 99 % 09/09/21 0918  Vitals shown include unvalidated device data.  Last Pain:  Vitals:   09/09/21 0740  TempSrc: Temporal  PainSc: 0-No pain         Complications: No notable events documented.  BP treated in PACU, ephedrine an dphenylephrine per chart.

## 2021-09-09 NOTE — Anesthesia Postprocedure Evaluation (Signed)
Anesthesia Post Note  Patient: Joshua [name]  Procedure(s) Performed: COLONOSCOPY ESOPHAGOGASTRODUODENOSCOPY (EGD)  Patient location during evaluation: Endoscopy Anesthesia Type: General Level of consciousness: awake and alert Pain management: pain level controlled Vital Signs Assessment: post-procedure vital signs reviewed and stable Respiratory status: spontaneous breathing, nonlabored ventilation, respiratory function stable and patient connected to nasal cannula oxygen Cardiovascular status: blood pressure returned to baseline and stable Postop Assessment: no apparent nausea or vomiting Anesthetic complications: no   No notable events documented.   Last Vitals:  Vitals:   09/09/21 0930 09/09/21 0940  BP: (!) 100/54 121/64  Pulse: 98 88  Resp: 16 17  Temp:    SpO2: 99% 99%    Last Pain:  Vitals:   09/09/21 0740  TempSrc: Temporal  PainSc: 0-No pain                 Martha Clan

## 2021-09-10 LAB — SURGICAL PATHOLOGY

## 2021-09-12 ENCOUNTER — Other Ambulatory Visit: Payer: Self-pay

## 2021-09-12 ENCOUNTER — Ambulatory Visit (INDEPENDENT_AMBULATORY_CARE_PROVIDER_SITE_OTHER): Payer: Medicare Other | Admitting: Vascular Surgery

## 2021-09-12 VITALS — BP 156/87 | HR 76 | Ht 71.0 in | Wt 206.0 lb

## 2021-09-12 DIAGNOSIS — E782 Mixed hyperlipidemia: Secondary | ICD-10-CM

## 2021-09-12 DIAGNOSIS — G8929 Other chronic pain: Secondary | ICD-10-CM

## 2021-09-12 DIAGNOSIS — I10 Essential (primary) hypertension: Secondary | ICD-10-CM | POA: Diagnosis not present

## 2021-09-12 DIAGNOSIS — I70219 Atherosclerosis of native arteries of extremities with intermittent claudication, unspecified extremity: Secondary | ICD-10-CM | POA: Diagnosis not present

## 2021-09-12 DIAGNOSIS — M545 Low back pain, unspecified: Secondary | ICD-10-CM | POA: Diagnosis not present

## 2021-09-13 ENCOUNTER — Encounter: Payer: Self-pay | Admitting: Oncology

## 2021-09-13 ENCOUNTER — Inpatient Hospital Stay: Payer: Medicare Other | Admitting: Oncology

## 2021-09-13 ENCOUNTER — Inpatient Hospital Stay: Payer: Medicare Other

## 2021-09-13 ENCOUNTER — Other Ambulatory Visit: Payer: Self-pay

## 2021-09-13 VITALS — BP 161/88 | HR 76 | Temp 97.3°F | Resp 16 | Wt 208.9 lb

## 2021-09-13 DIAGNOSIS — C3491 Malignant neoplasm of unspecified part of right bronchus or lung: Secondary | ICD-10-CM

## 2021-09-13 DIAGNOSIS — Z5112 Encounter for antineoplastic immunotherapy: Secondary | ICD-10-CM

## 2021-09-13 DIAGNOSIS — E032 Hypothyroidism due to medicaments and other exogenous substances: Secondary | ICD-10-CM | POA: Diagnosis not present

## 2021-09-13 LAB — CBC WITH DIFFERENTIAL/PLATELET
Abs Immature Granulocytes: 0 10*3/uL (ref 0.00–0.07)
Basophils Absolute: 0.1 10*3/uL (ref 0.0–0.1)
Basophils Relative: 1 %
Eosinophils Absolute: 0.2 10*3/uL (ref 0.0–0.5)
Eosinophils Relative: 4 %
HCT: 37.5 % — ABNORMAL LOW (ref 39.0–52.0)
Hemoglobin: 12.3 g/dL — ABNORMAL LOW (ref 13.0–17.0)
Immature Granulocytes: 0 %
Lymphocytes Relative: 19 %
Lymphs Abs: 0.9 10*3/uL (ref 0.7–4.0)
MCH: 29.1 pg (ref 26.0–34.0)
MCHC: 32.8 g/dL (ref 30.0–36.0)
MCV: 88.7 fL (ref 80.0–100.0)
Monocytes Absolute: 0.6 10*3/uL (ref 0.1–1.0)
Monocytes Relative: 12 %
Neutro Abs: 3.1 10*3/uL (ref 1.7–7.7)
Neutrophils Relative %: 64 %
Platelets: 157 10*3/uL (ref 150–400)
RBC: 4.23 MIL/uL (ref 4.22–5.81)
RDW: 14 % (ref 11.5–15.5)
WBC: 4.9 10*3/uL (ref 4.0–10.5)
nRBC: 0 % (ref 0.0–0.2)

## 2021-09-13 LAB — TSH: TSH: 171 u[IU]/mL — ABNORMAL HIGH (ref 0.350–4.500)

## 2021-09-13 LAB — COMPREHENSIVE METABOLIC PANEL
ALT: 18 U/L (ref 0–44)
AST: 28 U/L (ref 15–41)
Albumin: 4 g/dL (ref 3.5–5.0)
Alkaline Phosphatase: 65 U/L (ref 38–126)
Anion gap: 9 (ref 5–15)
BUN: 11 mg/dL (ref 8–23)
CO2: 25 mmol/L (ref 22–32)
Calcium: 8.8 mg/dL — ABNORMAL LOW (ref 8.9–10.3)
Chloride: 106 mmol/L (ref 98–111)
Creatinine, Ser: 1.11 mg/dL (ref 0.61–1.24)
GFR, Estimated: >60 mL/min (ref >60)
Glucose, Bld: 108 mg/dL — ABNORMAL HIGH (ref 70–99)
Potassium: 3.6 mmol/L (ref 3.5–5.1)
Sodium: 140 mmol/L (ref 135–145)
Total Bilirubin: 0.3 mg/dL (ref 0.3–1.2)
Total Protein: 7.1 g/dL (ref 6.5–8.1)

## 2021-09-13 LAB — T4, FREE: Free T4: 0.56 ng/dL — ABNORMAL LOW (ref 0.61–1.12)

## 2021-09-13 MED ORDER — SODIUM CHLORIDE 0.9 % IV SOLN
Freq: Once | INTRAVENOUS | Status: AC
  Filled 2021-09-13: qty 250

## 2021-09-13 MED ORDER — SODIUM CHLORIDE 0.9 % IV SOLN
10.0000 mg/kg | Freq: Once | INTRAVENOUS | Status: AC
  Administered 2021-09-13: 860 mg via INTRAVENOUS
  Filled 2021-09-13: qty 10

## 2021-09-13 MED ORDER — LEVOTHYROXINE SODIUM 88 MCG PO TABS: 88.0000 ug | ORAL_TABLET | Freq: Every day | ORAL | 1 refills | Status: DC

## 2021-09-13 MED ORDER — HEPARIN SOD (PORK) LOCK FLUSH 100 UNIT/ML IV SOLN
500.0000 [IU] | Freq: Once | INTRAVENOUS | Status: AC | PRN
  Administered 2021-09-13: 500 [IU]
  Filled 2021-09-13: qty 5

## 2021-09-13 NOTE — Progress Notes (Signed)
Pt in for follow up, denies any concerns today. 

## 2021-09-13 NOTE — Patient Instructions (Signed)
[facility] CANCER CTR AT [place]  Discharge Instructions: Thank you for choosing [facility] to provide your oncology and hematology care.  If you have a lab appointment with the [facility], please go directly to the [place] and check in at the registration area.  Wear comfortable clothing and clothing appropriate for easy access to any Portacath or PICC line.   We strive to give you quality time with your provider. You may need to reschedule your appointment if you arrive late (15 or more minutes).  Arriving late affects you and other patients whose appointments are after yours.  Also, if you miss three or more appointments without notifying the office, you may be dismissed from the clinic at the providers discretion.      For prescription refill requests, have your pharmacy contact our office and allow 72 hours for refills to be completed.    Today you received the following chemotherapy and/or immunotherapy agents Imfiniz   To help prevent nausea and vomiting after your treatment, we encourage you to take your nausea medication as directed.  BELOW ARE SYMPTOMS THAT SHOULD BE REPORTED IMMEDIATELY: *FEVER GREATER THAN 100.4 F (38 C) OR HIGHER *CHILLS OR SWEATING *NAUSEA AND VOMITING THAT IS NOT CONTROLLED WITH YOUR NAUSEA MEDICATION *UNUSUAL SHORTNESS OF BREATH *UNUSUAL BRUISING OR BLEEDING *URINARY PROBLEMS (pain or burning when urinating, or frequent urination) *BOWEL PROBLEMS (unusual diarrhea, constipation, pain near the anus) TENDERNESS IN MOUTH AND THROAT WITH OR WITHOUT PRESENCE OF ULCERS (sore throat, sores in mouth, or a toothache) UNUSUAL RASH, SWELLING OR PAIN  UNUSUAL VAGINAL DISCHARGE OR ITCHING   Items with * indicate a potential emergency and should be followed up as soon as possible or go to the Emergency Department if any problems should occur.  Please show the CHEMOTHERAPY ALERT CARD or IMMUNOTHERAPY ALERT CARD at check-in to the  Emergency Department and triage nurse.  Should you have questions after your visit or need to cancel or reschedule your appointment, please contact [facility] CANCER [place] AT [place]  [phone] and follow the prompts.  Office hours are 8:00 a.m. to 4:30 p.m. Monday - Friday. Please note that voicemails left after 4:00 p.m. may not be returned until the following business day.  We are closed weekends and major holidays. You have access to a nurse at all times for urgent questions. Please call the main number to the clinic [phone] and follow the prompts.  For any non-urgent questions, you may also contact your provider using MyChart. We now offer e-Visits for anyone 38 and older to request care online for non-urgent symptoms. For details visit [email or web address].   Also download the MyChart app! Go to the app store, search "MyChart", open the app, select [facility], and log in with your MyChart username and password.  Due to Covid, a mask is required upon entering the hospital/clinic. If you do not have a mask, one will be given to you upon arrival. For doctor visits, patients may have 1 support person aged 5 or older with them. For treatment visits, patients cannot have anyone with them due to current Covid guidelines and our immunocompromised population.

## 2021-09-13 NOTE — Progress Notes (Signed)
Hematology/Oncology Progress note [phone] [phone]     Patient Care Team: [name], MD as PCP - General (Physician Assistant) [name], MD as Referring Physician (Physician Assistant) Bary Castilla, [name], MD (General Surgery) Telford Nab, RN as Oncology Nurse Navigator  REFERRING PROVIDER: [name], MD  CHIEF COMPLAINTS/REASON FOR VISIT:  Follow up for lung cancer  HISTORY OF PRESENTING ILLNESS:   Joshua [name] is a  66 y.o.  male with PMH listed below was seen in consultation at the request of  [name], MD  for evaluation of lung mass  Patient presented to emergency room on 08/25/2020 for evaluation of hemoptysis, coughing.  He coughed up small amount of blood and a large clot.  No fever, chills, chest pain, abdominal pain.  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  Patient is on Plavix and Eliquis.  Since his ER visit, he has not coughed up more blood.  08/25/2020 CT angio chest PE with and without contrast showed ill-defined soft tissue adjacent to the right upper lobe bronchus resulting in narrowing of right upper lobe bronchus.  Also right paratracheal mediastinal adenopathy.  Scattered centrilobular groundglass opacities within the right lower lobe and to a lesser extent within the right lower lobe likely reflecting an infectious or inflammatory process. # 09/14/2020, right upper lobe lung biopsy and bronchoscopy brushing are both positive for squamous cell carcinoma.Right precarinal space lymph node insufficient lymph node sampling for staging. 09/18/2020, PET scan showed right upper lobe nodule maximal SUV 11.5.  Hypermetabolic cluster right paratracheal adenopathy with maximum SUV up to 12.  No findings of metastatic disease to the neck/abdomen/pelvis or skeleton.  #  10/03/2000, MRI brain with and without contrast showed no evidence of intracranial metastatic disease. 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  # - NGS showed PD-L1 TPS 95%, LRP1B S987fs, PIK3CA E545K, RB1 c1422-2A>T, RIT1 T83_ A84del, STK11 P238fs, TPS53 R248W, TMB 19.47mut/mb, MS stable.   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  Management per ENT   #10/10/2020 started concurrent chemoradiation.  Patient received 4 cycles of carboplatin AUC of 2 and Taxol 45 mg per metered square  Additional chemotherapy was held due to thrombocytopenia, neutropenia Patient finished radiation on 11/27/2020  #12/31/2020 -03/21/2021, Durvalumab maintenance  03/28/2021, CT without contrast showed new linear right perihilar consolidation with associated traction bronchiectasis.  Likely postradiation changes.  Additional new peribronchial vascular/nodular groundglass opacities with areas of consolidation or seen primarily in the right upper and lower lobes, more distant from expected radiation fields.  Findings are possibly due to radiation-induced organizing pneumonia.  Or superimposed drug toxicity.  Recommend follow-up CT chest in 1 to 2 months for assessment of evaluation.  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  Patient was seen by pulmonology Dr. Patsey Berthold. Per my discussion with pulmonology Dr. 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  Recent CT showed improvement.  05/09/2021, repeat CT chest without contrast showed evolving radiation changes in the right perihilar region likely accounting for interval increase to thickening of the posterior wall of the right upper lobe bronchus.  The more peripheral right lung groundglass opacities seen on most recent study have generally improved with exception of one peripherally right upper lobe which appears new.  These  are likely inflammatory.  No findings highly suspicious for local recurrence.  05/30/2021 resumed on Durvalumab.  Dr. Patsey Berthold switched inhaler to  [facility] as he is not able to tolerate LABA/LAMA combination.  #Left-sided parotid nodule, patient follows up with ENT.  Nodule was biopsied and pathology was not diagnostic, acellular debris's with amylase crystalloids.#Left-sided parotid nodule, patient follows up with ENT.  Nodule was biopsied and pathology was not diagnostic, acellular debris's with amylase crystalloids.  INTERVAL HISTORY Joshua [name] is a 66 y.o. male who has above history reviewed by me today presents for follow up visit for Stage III lung squamous cell carcinoma. Patient reports feeling well.   Patient has no new complaints.  Denies cough, diarrhea. Mild shortness of breath with exertion.  Review of Systems  Constitutional:  Negative for appetite change, chills, fatigue, fever and unexpected weight change.  HENT:   Negative for hearing loss and voice change.   Eyes:  Negative for eye problems and icterus.  Respiratory:  Negative for chest tightness, cough, hemoptysis and shortness of breath.   Cardiovascular:  Negative for chest pain and leg swelling.  Gastrointestinal:  Negative for abdominal distention and abdominal pain.  Endocrine: Negative for hot flashes.  Genitourinary:  Negative for difficulty urinating, dysuria and frequency.   Musculoskeletal:  Negative for arthralgias.  Skin:  Negative for itching and rash.  Neurological:  Negative for light-headedness and numbness.  Hematological:  Negative for adenopathy. Does not bruise/bleed easily.  Psychiatric/Behavioral:  Negative for confusion.    MEDICAL HISTORY:  Past Medical History:  Diagnosis Date   Allergy    Cancer ([place])    lung   Dyspnea    former smoker. only doe   History of kidney stones    Hypertension    Peripheral vascular disease ([place])    Pre-diabetes    Sleep apnea     SURGICAL  HISTORY: Past Surgical History:  Procedure Laterality Date   ANTERIOR CERVICAL DECOMP/DISCECTOMY FUSION N/A 03/14/2020   Procedure: ANTERIOR CERVICAL DECOMPRESSION/DISCECTOMY FUSION 3 LEVELS C4-7;  Surgeon: Meade Maw, MD;  Location: [facility] ORS;  Service: Neurosurgery;  Laterality: N/A;   BACK SURGERY  2011   CENTRAL LINE INSERTION Right 09/10/2016   Procedure: CENTRAL LINE INSERTION;  Surgeon: Katha Cabal, MD;  Location: [facility] ORS;  Service: Vascular;  Laterality: Right;   CERVICAL FUSION     C4-c7 on Mar 14, 2020   COLONOSCOPY  2013 ?   COLONOSCOPY N/A 09/09/2021   Procedure: COLONOSCOPY;  Surgeon: [name], DO;  Location: [facility] END[facility]COPY;  Service: Gastroenterology;  Laterality: N/A;   CORONARY ANGIOPLASTY     ESOPHAGOGASTRODUODENOSCOPY N/A 09/09/2021   Procedure: ESOPHAGOGASTRODUODENOSCOPY (EGD);  Surgeon: [name], DO;  Location: [facility] ENDOSCOPY;  Service: Gastroenterology;  Laterality: N/A;   FEMORAL-POPLITEAL BYPASS GRAFT Right 09/10/2016   Procedure: BYPASS GRAFT FEMORAL-POPLITEAL ARTERY ( BELOW KNEE );  Surgeon: Katha Cabal, MD;  Location: [facility] ORS;  Service: Vascular;  Laterality: Right;   FEMORAL-TIBIAL BYPASS GRAFT Right 03/23/2020   Procedure: BYPASS GRAFT RIGHT FEMORAL- DISTAL TIBIAL ARTERY WITH DISTAL FLOW GRAFT;  Surgeon: Katha Cabal, MD;  Location: [facility] ORS;  Service: Vascular;  Laterality: Right;   LEFT HEART CATH AND CORONARY ANGIOGRAPHY Left 05/16/2021   Procedure: LEFT HEART CATH AND CORONARY ANGIOGRAPHY;  Surgeon: Andrez Grime, MD;  Location: [place] [facility] LAB;  Service: Cardiovascular;  Laterality: Left;   LOWER EXTREMITY ANGIOGRAPHY Right 11/18/2016   Procedure: Lower Extremity Angiography;  Surgeon: Katha Cabal, MD;  Location:  [place] [facility];  Service: Cardiovascular;  Laterality: Right;   LOWER EXTREMITY ANGIOGRAPHY Right 12/09/2016   Procedure: Lower Extremity Angiography;  Surgeon: Katha Cabal,  MD;  Location: [place] [facility];  Service: Cardiovascular;  Laterality: Right;   LOWER EXTREMITY ANGIOGRAPHY Right 11/15/2019   Procedure: LOWER EXTREMITY ANGIOGRAPHY;  Surgeon: Katha Cabal, MD;  Location: [place] [facility];  Service: Cardiovascular;  Laterality: Right;   LOWER EXTREMITY ANGIOGRAPHY Right 12/01/2019   Procedure: Lower Extremity Angiography;  Surgeon: Algernon Huxley, MD;  Location: [place] [facility];  Service: Cardiovascular;  Laterality: Right;   LOWER EXTREMITY ANGIOGRAPHY Right 12/02/2019   Procedure: LOWER EXTREMITY ANGIOGRAPHY;  Surgeon: Katha Cabal, MD;  Location: [place] [facility];  Service: Cardiovascular;  Laterality: Right;   LOWER EXTREMITY ANGIOGRAPHY Right 03/23/2020   Procedure: Lower Extremity Angiography;  Surgeon: Katha Cabal, MD;  Location: [place] [facility];  Service: Cardiovascular;  Laterality: Right;   LOWER EXTREMITY INTERVENTION  11/18/2016   Procedure: Lower Extremity Intervention;  Surgeon: Katha Cabal, MD;  Location: [place] [facility];  Service: Cardiovascular;;   PERIPHERAL VASCULAR CATHETERIZATION Right 01/02/2015   Procedure: Lower Extremity Angiography;  Surgeon: Katha Cabal, MD;  Location: [place] [facility];  Service: Cardiovascular;  Laterality: Right;   PERIPHERAL VASCULAR CATHETERIZATION Right 06/18/2015   Procedure: Lower Extremity Angiography;  Surgeon: Algernon Huxley, MD;  Location: [place] [facility];  Service: Cardiovascular;  Laterality: Right;   PERIPHERAL VASCULAR CATHETERIZATION  06/18/2015   Procedure: Lower Extremity Intervention;  Surgeon: Algernon Huxley, MD;  Location: [place] [facility];  Service: Cardiovascular;;   PERIPHERAL VASCULAR CATHETERIZATION N/A 10/09/2015   Procedure: Abdominal Aortogram w/Lower Extremity;  Surgeon: Katha Cabal, MD;  Location: [place] [facility];  Service: Cardiovascular;  Laterality: N/A;   PERIPHERAL VASCULAR CATHETERIZATION  10/09/2015   Procedure: Lower  Extremity Intervention;  Surgeon: Katha Cabal, MD;  Location: [place] [facility];  Service: Cardiovascular;;   PERIPHERAL VASCULAR CATHETERIZATION Right 10/10/2015   Procedure: Lower Extremity Angiography;  Surgeon: Algernon Huxley, MD;  Location: [place] [facility];  Service: Cardiovascular;  Laterality: Right;   PERIPHERAL VASCULAR CATHETERIZATION Right 10/10/2015   Procedure: Lower Extremity Intervention;  Surgeon: Algernon Huxley, MD;  Location: [place] [facility];  Service: Cardiovascular;  Laterality: Right;   PERIPHERAL VASCULAR CATHETERIZATION Right 12/03/2015   Procedure: Lower Extremity Angiography;  Surgeon: Algernon Huxley, MD;  Location: [place] [facility];  Service: Cardiovascular;  Laterality: Right;   PERIPHERAL VASCULAR CATHETERIZATION Right 12/04/2015   Procedure: Lower Extremity Angiography;  Surgeon: Algernon Huxley, MD;  Location: [place] [facility];  Service: Cardiovascular;  Laterality: Right;   PERIPHERAL VASCULAR CATHETERIZATION  12/04/2015   Procedure: Lower Extremity Intervention;  Surgeon: Algernon Huxley, MD;  Location: [place] [facility];  Service: Cardiovascular;;   PERIPHERAL VASCULAR CATHETERIZATION Right 06/30/2016   Procedure: Lower Extremity Angiography;  Surgeon: Algernon Huxley, MD;  Location: [place] [facility];  Service: Cardiovascular;  Laterality: Right;   PERIPHERAL VASCULAR CATHETERIZATION Right 06/25/2016   Procedure: Lower Extremity Angiography;  Surgeon: Katha Cabal, MD;  Location: [place] [facility];  Service: Cardiovascular;  Laterality: Right;   PERIPHERAL VASCULAR CATHETERIZATION Right 07/01/2016   Procedure: Lower Extremity Angiography;  Surgeon: Katha Cabal, MD;  Location: [place] [facility];  Service: Cardiovascular;  Laterality: Right;   PERIPHERAL VASCULAR CATHETERIZATION  07/01/2016   Procedure:  Lower Extremity Intervention;  Surgeon: Katha Cabal, MD;  Location: [place] [facility];  Service: Cardiovascular;;   PERIPHERAL  VASCULAR CATHETERIZATION Right 08/01/2016   Procedure: Lower Extremity Angiography;  Surgeon: Katha Cabal, MD;  Location: [place] [facility];  Service: Cardiovascular;  Laterality: Right;   PORTA CATH INSERTION N/A 10/05/2020   Procedure: PORTA CATH INSERTION;  Surgeon: Katha Cabal, MD;  Location: [place] [facility];  Service: Cardiovascular;  Laterality: N/A;   stent placement in right leg Right    VIDEO BRONCHOSCOPY N/A 09/14/2020   Procedure: VIDEO BRONCHOSCOPY WITH FLUORO;  Surgeon: Tyler Pita, MD;  Location: [facility] ORS;  Service: Cardiopulmonary;  Laterality: N/A;   VIDEO BRONCHOSCOPY WITH ENDOBRONCHIAL ULTRASOUND N/A 09/14/2020   Procedure: VIDEO BRONCHOSCOPY WITH ENDOBRONCHIAL ULTRASOUND;  Surgeon: Tyler Pita, MD;  Location: [facility];  Service: Cardiopulmonary;  Laterality: N/A;    SOCIAL HISTORY: Social History   Socioeconomic History   Marital status: Single    Spouse name: Not on file   Number of children: Not on file   Years of education: Not on file   Highest education level: Not on file  Occupational History   Occupation: unemployed  Tobacco Use   Smoking status: Former    Packs/day: 2.00    Years: 38.00    Pack years: 76.00    Types: Cigarettes    Quit date: 12/20/2014    Years since quitting: 6.7   Smokeless tobacco: Never   Tobacco comments:    quit smoking in 2017 most smoked 2   Vaping Use   Vaping Use: Former  Substance and Sexual Activity   Alcohol use: Not Currently   Drug use: No   Sexual activity: Yes  Other Topics Concern   Not on file  Social History Narrative   Not on file   Social Determinants of Health   Financial Resource Strain: Not on file  Food Insecurity: Not on file  Transportation Needs: Not on file  Physical Activity: Not on file  Stress: Not on file  Social Connections: Not on file  Intimate Partner Violence: Not on file    FAMILY HISTORY: Family History  Problem Relation Age of Onset   Diabetes  Mother    Hypertension Mother    Heart murmur Mother    Leukemia Mother    Throat cancer Maternal Grandmother    Colon cancer Neg Hx    Breast cancer Neg Hx     ALLERGIES:  is allergic to tape.  MEDICATIONS:  Current Outpatient Medications  Medication Sig Dispense Refill   acetaminophen (TYLENOL) 325 MG tablet Take 650 mg by mouth every 6 (six) hours as needed for moderate pain.     clopidogrel (PLAVIX) 75 MG tablet TAKE 1 TABLET BY MOUTH  DAILY 90 tablet 3   ELIQUIS 5 MG TABS tablet TAKE 1 TABLET BY MOUTH  TWICE DAILY 120 tablet 5   levothyroxine (SYNTHROID) 88 MCG tablet Take 1 tablet (88 mcg total) by mouth daily before breakfast. 30 tablet 1   lidocaine-prilocaine (EMLA) cream Apply to affected area once (Patient taking differently: Apply 1 application topically daily as needed (port access).) 30 g 3   potassium chloride SA (KLOR-CON M) 20 MEQ tablet Take 1 tablet (20 mEq total) by mouth daily. 30 tablet 1   simvastatin (ZOCOR) 40 MG tablet Take 40 mg by mouth at bedtime.     Tiotropium Bromide-Olodaterol (STIOLTO RESPIMAT) 2.5-2.5 MCG/ACT AERS Inhale 2 puffs into the lungs daily. (  Patient not taking: Reported on 09/13/2021) 4 g 0   No current facility-administered medications for this visit.     PHYSICAL EXAMINATION: ECOG PERFORMANCE STATUS: 1 - Symptomatic but completely ambulatory Vitals:   09/13/21 0915  BP: (!) 161/88  Pulse: 76  Resp: 16  Temp: (!) 97.3 F (36.3 C)  SpO2: 99%   Filed Weights   09/13/21 0915  Weight: 208 lb 14.4 oz (94.8 kg)    Physical Exam Constitutional:      General: He is not in acute distress. HENT:     Head: Normocephalic and atraumatic.  Eyes:     General: No scleral icterus. Cardiovascular:     Rate and Rhythm: Normal rate and regular rhythm.     Heart sounds: Normal heart sounds.  Pulmonary:     Effort: Pulmonary effort is normal. No respiratory distress.     Breath sounds: No wheezing.  Abdominal:     General: There is no  distension.     Palpations: Abdomen is soft.  Musculoskeletal:        General: No deformity. Normal range of motion.     Cervical back: Normal range of motion and neck supple.  Skin:    General: Skin is warm and dry.     Findings: No erythema or rash.  Neurological:     Mental Status: He is alert and oriented to person, place, and time. Mental status is at baseline.     Cranial Nerves: No cranial nerve deficit.     Coordination: Coordination normal.  Psychiatric:        Mood and Affect: Mood normal.    LABORATORY DATA:  I have reviewed the data as listed Lab Results  Component Value Date   WBC 4.9 09/13/2021   HGB 12.3 (L) 09/13/2021   HCT 37.5 (L) 09/13/2021   MCV 88.7 09/13/2021   PLT 157 09/13/2021   Recent Labs    08/16/21 0820 08/30/21 0816 09/13/21 0847  NA 142 140 140  K 4.0 3.7 3.6  CL 107 106 106  CO2 $Re'26 25 25  'dLV$ GLUCOSE 116* 124* 108*  BUN $Re'22 21 11  'zlH$ CREATININE 1.36* 1.15 1.11  CALCIUM 9.2 9.2 8.8*  GFRNONAA 58* >60 >60  PROT 7.4 7.1 7.1  ALBUMIN 4.1 4.0 4.0  AST $Re'22 19 28  'oHG$ ALT $R'18 14 18  'gD$ ALKPHOS 73 68 65  BILITOT 0.5 0.3 0.3    Iron/TIBC/Ferritin/ %Sat    Component Value Date/Time   IRON 48 08/30/2020 1052   TIBC 391 08/30/2020 1052   FERRITIN 25 08/30/2020 1052   IRONPCTSAT 12 (L) 08/30/2020 1052      RADIOGRAPHIC STUDIES: I have personally reviewed the radiological images as listed and agreed with the findings in the report. CT SOFT TISSUE NECK W CONTRAST  Result Date: 07/10/2021 CLINICAL DATA:  66 year old male with history of non-small cell right upper lobe lung cancer. Right lung radiation. Left-side palpable mass. EXAM: CT NECK WITH CONTRAST TECHNIQUE: Multidetector CT imaging of the neck was performed using the standard protocol following the bolus administration of intravenous contrast. CONTRAST:  47mL ISOVUE-300 IOPAMIDOL (ISOVUE-300) INJECTION 61% COMPARISON:  Cervical spine MRI 02/06/2020.  Chest CT 05/09/2021. FINDINGS: Pharynx and  larynx: Negative larynx and pharynx. Parapharyngeal and retropharyngeal spaces are normal. Salivary glands: Negative sublingual space. Right side submandibular and parotid glands are within normal limits. Cystic mass with thin but irregular rim of soft tissue (series 6, image 39 and series 5, image 83) measures up to 29 mm  diameter and is situated between the left submandibular and inferior parotid glands in the left neck on axial series 3, image 41. The lesion is inseparable from both glands, possibly with a "claw sign" with the submandibular gland on sagittal image 81. But the underlying left side salivary gland parenchyma elsewhere is normal. No regional inflammation. This area is not clearly visible on the 2019 cervical spine MRI. And there is no other neck cross-sectional imaging. Thyroid: Diminutive, negative. Lymph nodes: No solid cervical lymphadenopathy superimposed on the left side cystic mass detailed above. Bilateral cervical nodes are diminutive. Vascular: Right IJ Port-A-Cath is partially visible. Major vascular structures in the neck and at the skull base are patent. Left vertebral artery is dominant and the right is diminutive at the skull base. Limited intracranial: Negative. Visualized orbits: Not included. Mastoids and visualized paranasal sinuses: Visible sinuses are well aerated. There is mild right maxillary alveolar recess mucosal thickening. Visible tympanic cavities and mastoids are clear. Skeleton: Left mandible appears intact and normal near the cystic mass. Previous cervical ACDF C4 through C7 with evidence of solid arthrodesis. Other cervical spine degeneration. No acute or suspicious osseous lesion identified. Upper chest: Stable compared to the October chest CT. No superior mediastinal lymphadenopathy. IMPRESSION: 1. Left neck cystic mass with soft tissue rim is 2.9 cm and situated directly between the left submandibular and inferior parotid glands, inseparable from both. Indirect  evidence the lesion may have originated from the [facility]. No regional inflammation or lymphadenopathy. Background gland parenchyma, and the right side salivary glands appear normal. 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 2. Right jugular Port-A-Cath in place. Stable visible post treatment appearance of the upper chest. Previous cervical ACDF. Electronically Signed   By: Genevie Ann M.D.   On: 07/10/2021 08:48   CT CHEST WO CONTRAST  Result Date: 08/24/2021 CLINICAL DATA:  Lung cancer.  Squamous cell carcinoma lung. EXAM: CT CHEST WITHOUT CONTRAST TECHNIQUE: Multidetector CT imaging of the chest was performed following the standard protocol without IV contrast. RADIATION DOSE REDUCTION: This exam was performed according to the departmental dose-optimization program which includes automated exposure control, adjustment of the mA and/or kV according to patient size and/or use of iterative reconstruction technique. COMPARISON:  CT 10 2022 FINDINGS: Cardiovascular: Port in the anterior chest wall with tip in distal SVC. Coronary artery calcification and aortic atherosclerotic calcification. Mediastinum/Nodes: No axillary or supraclavicular adenopathy. No mediastinal or hilar adenopathy. No pericardial fluid. Esophagus normal. Lungs/Pleura: RIGHT perihilar consolidation with air bronchograms in a pattern typical of post radiation change. No new nodularity or interval growth. Ground-glass nodule in the RIGHT upper lobe measuring 10 mm (image 50/3) is new from prior. More superior ground-glass nodule identified on comparison exam is no longer measurable. Small ground-glass nodule in the RIGHT upper lobe just anterior to the fissure (image 61/3) is also new from prior. Upper Abdomen: Limited view of the liver, kidneys, pancreas are unremarkable. Normal adrenal glands. Musculoskeletal: No aggressive osseous lesion. IMPRESSION: 1. Stable post radiation change about the RIGHT hilum. 2. Two  new ground-glass nodules in the RIGHT upper lobe. Resolution of previous described more superior ground-glass nodule in the same vicinity. Waxing and waning ground-glass nodules typically indicates a benign infectious or inflammatory process. Recommend continued surveillance of these nodules. Electronically Signed   By: Suzy Bouchard M.D.   On: 08/24/2021 09:13   Pulmonary Function Test [facility] Only  Result Date: 07/20/2021 Spirometry Data Is Acceptable and Reproducible CONSIDER MILD Moderate Obstructive  Airways Disease without  Significant Broncho-Dilator Response Also CONSIDER Moderate Restrictive Lung disease Consider outpatient Pulmonary Consultation if needed Clinical Correlation Advised   [place] CORE BIOPSY (SOFT TISSUE)  Result Date: 08/07/2021 INDICATION: Enlarging left-sided parotid nodule. History of previous ultrasound-guided core needle biopsy on 11/07/2020. Please perform repeat ultrasound-guided biopsy for tissue diagnostic purposes. EXAM: ULTRASOUND-GUIDED LEFT PAROTID NODULE BIOPSY COMPARISON:  Neck CT-07/09/2021 Ultrasound-guided left parotid nodule biopsy-11/07/2020 MEDICATIONS: None ANESTHESIA/SEDATION: None COMPLICATIONS: None immediate. TECHNIQUE: Informed written consent was obtained from the patient after a discussion of the risks, benefits and alternatives to treatment. Questions regarding the procedure were encouraged and answered. Initial ultrasound scanning demonstrated unchanged size and appearance of the approximately 2.8 x 1.7 x 2.2 cm nodule adjacent to the anterior inferior aspect the left parotid gland (images 2 through 11), similar to neck CT performed 07/09/2021, image 42, series 3. An ultrasound image was saved for documentation purposes. The procedure was planned. A timeout was performed prior to the initiation of the procedure. The operative was prepped and draped in the usual sterile fashion, and a sterile drape was applied covering the operative field. A timeout was  performed prior to the initiation of the procedure. Local anesthesia was provided with 1% lidocaine with epinephrine. Under direct ultrasound guidance, an 18 gauge core needle device was utilized to obtain to obtain 6 core needle biopsies of the indeterminate left parotid gland nodule. The samples were placed in saline and submitted to pathology. The needle was removed and hemostasis was achieved with manual compression. Post procedure scan was negative for significant hematoma. A dressing was applied. The patient tolerated the procedure well without immediate postprocedural complication. IMPRESSION: Technically successful ultrasound guided biopsy of indeterminate left parotid gland nodule Electronically Signed   By: Sandi Mariscal M.D.   On: 08/07/2021 16:40       ASSESSMENT & PLAN:  1. Squamous cell carcinoma of right lung ([place])   2. 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 08/24/2021, CT chest without contrast showed stable postradiation change about the right hilum.  2 new groundglass nodules in the right upper lobe.  Resolution of previous described mostly.  Groundglass nodules in the same density.  Waxing and waning groundglass nodules typically indicates a benign infectious or inflammatory process.   We will repeat CT in 3 months.  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   Continue Synthroid 43mcg.  TSH remains elevated at 84.  Free T4 is pending.                                                           All questions were answered. The patient knows to call the clinic with any problems  questions or concerns.  cc [name], MD   Return of visit: Lab MD durvalumab in 2 weeks-  [name], MD, PhD  09/13/2021

## 2021-09-13 NOTE — Progress Notes (Signed)
Hematology/Oncology Progress note [phone] [phone]     Patient Care Team: [name], MD as PCP - General (Physician Assistant) [name], MD as Referring Physician (Physician Assistant) Bary Castilla, [name], MD (General Surgery) Telford Nab, RN as Oncology Nurse Navigator  REFERRING PROVIDER: [name], MD  CHIEF COMPLAINTS/REASON FOR VISIT:  Follow up for lung cancer  HISTORY OF PRESENTING ILLNESS:   Joshua [name] is a  66 y.o.  male with PMH listed below was seen in consultation at the request of  [name], MD  for evaluation of lung mass  Patient presented to emergency room on 08/25/2020 for evaluation of hemoptysis, coughing.  He coughed up small amount of blood and a large clot.  No fever, chills, chest pain, abdominal pain.  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  Patient is on Plavix and Eliquis.  Since his ER visit, he has not coughed up more blood.  08/25/2020 CT angio chest PE with and without contrast showed ill-defined soft tissue adjacent to the right upper lobe bronchus resulting in narrowing of right upper lobe bronchus.  Also right paratracheal mediastinal adenopathy.  Scattered centrilobular groundglass opacities within the right lower lobe and to a lesser extent within the right lower lobe likely reflecting an infectious or inflammatory process. # 09/14/2020, right upper lobe lung biopsy and bronchoscopy brushing are both positive for squamous cell carcinoma.Right precarinal space lymph node insufficient lymph node sampling for staging. 09/18/2020, PET scan showed right upper lobe nodule maximal SUV 11.5.  Hypermetabolic cluster right paratracheal adenopathy with maximum SUV up to 12.  No findings of metastatic disease to the neck/abdomen/pelvis or skeleton.  #  10/03/2000, MRI brain with and without contrast showed no evidence of intracranial metastatic disease. 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  # - NGS showed PD-L1 TPS 95%, LRP1B S929fs, PIK3CA E545K, RB1 c1422-2A>T, RIT1 T83_ A84del, STK11 P250fs, TPS53 R248W, TMB 19.69mut/mb, MS stable.   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  Management per ENT   #10/10/2020 started concurrent chemoradiation.  Patient received 4 cycles of carboplatin AUC of 2 and Taxol 45 mg per metered square  Additional chemotherapy was held due to thrombocytopenia, neutropenia Patient finished radiation on 11/27/2020  #12/31/2020 -03/21/2021, Durvalumab maintenance  03/28/2021, CT without contrast showed new linear right perihilar consolidation with associated traction bronchiectasis.  Likely postradiation changes.  Additional new peribronchial vascular/nodular groundglass opacities with areas of consolidation or seen primarily in the right upper and lower lobes, more distant from expected radiation fields.  Findings are possibly due to radiation-induced organizing pneumonia.  Or superimposed drug toxicity.  Recommend follow-up CT chest in 1 to 2 months for assessment of evaluation.  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  Patient was seen by pulmonology Dr. Patsey Berthold. Per my discussion with pulmonology Dr. 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  Recent CT showed improvement.  05/09/2021, repeat CT chest without contrast showed evolving radiation changes in the right perihilar region likely accounting for interval increase to thickening of the posterior wall of the right upper lobe bronchus.  The more peripheral right lung groundglass opacities seen on most recent study have generally improved with exception of one peripherally right upper lobe which appears new.  These  are likely inflammatory.  No findings highly suspicious for local recurrence.  05/30/2021 resumed on Durvalumab.  Dr. Patsey Berthold switched inhaler to  [facility] as he is not able to tolerate LABA/LAMA combination.  #Left-sided parotid nodule, patient follows up with ENT.  Nodule was biopsied and pathology was not diagnostic, acellular debris's with amylase crystalloids.#Left-sided parotid nodule, patient follows up with ENT.  Nodule was biopsied and pathology was not diagnostic, acellular debris's with amylase crystalloids.  08/24/2021, CT chest without contrast showed stable postradiation change about the right hilum.  2 new groundglass nodules in the right upper lobe.  Resolution of previous described mostly.  Groundglass nodules in the same density.  Waxing and waning groundglass nodules typically indicates a benign infectious or inflammatory process.    INTERVAL HISTORY Joshua [name] is a 66 y.o. male who has above history reviewed by me today presents for follow up visit for Stage III lung squamous cell carcinoma. Patient reports feeling well.   Patient has no new complaints.  Denies cough,  diarrhea. Mild shortness of breath with exertion. Review of Systems  Constitutional:  Negative for appetite change, chills, fatigue, fever and unexpected weight change.  HENT:   Negative for hearing loss and voice change.   Eyes:  Negative for eye problems and icterus.  Respiratory:  Negative for chest tightness, cough, hemoptysis and shortness of breath.   Cardiovascular:  Negative for chest pain and leg swelling.  Gastrointestinal:  Negative for abdominal distention and abdominal pain.  Endocrine: Negative for hot flashes.  Genitourinary:  Negative for difficulty urinating, dysuria and frequency.   Musculoskeletal:  Negative for arthralgias.  Skin:  Negative for itching and rash.  Neurological:  Negative for light-headedness and numbness.  Hematological:  Negative for adenopathy. Does not  bruise/bleed easily.  Psychiatric/Behavioral:  Negative for confusion.    MEDICAL HISTORY:  Past Medical History:  Diagnosis Date   Allergy    Cancer ([place])    lung   Dyspnea    former smoker. only doe   History of kidney stones    Hypertension    Peripheral vascular disease ([place])    Pre-diabetes    Sleep apnea     SURGICAL HISTORY: Past Surgical History:  Procedure Laterality Date   ANTERIOR CERVICAL DECOMP/DISCECTOMY FUSION N/A 03/14/2020   Procedure: ANTERIOR CERVICAL DECOMPRESSION/DISCECTOMY FUSION 3 LEVELS C4-7;  Surgeon: Meade Maw, MD;  Location: [facility] ORS;  Service: Neurosurgery;  Laterality: N/A;   BACK SURGERY  2011   CENTRAL LINE INSERTION Right 09/10/2016   Procedure: CENTRAL LINE INSERTION;  Surgeon: Katha Cabal, MD;  Location: [facility] ORS;  Service: Vascular;  Laterality: Right;   CERVICAL FUSION     C4-c7 on Mar 14, 2020   COLONOSCOPY  2013 ?   COLONOSCOPY N/A 09/09/2021   Procedure: COLONOSCOPY;  Surgeon: [name], DO;  Location: [name] ENDOSCOPY;  Service: Gastroenterology;  Laterality: N/A;   CORONARY ANGIOPLASTY     ESOPHAGOGASTRODUODENOSCOPY N/A 09/09/2021   Procedure: ESOPHAGOGASTRODUODENOSCOPY (EGD);  Surgeon: [name], DO;  Location: [facility] ENDOSCOPY;  Service: Gastroenterology;  Laterality: N/A;   FEMORAL-POPLITEAL BYPASS GRAFT Right 09/10/2016   Procedure: BYPASS GRAFT FEMORAL-POPLITEAL ARTERY ( BELOW KNEE );  Surgeon: Katha Cabal, MD;  Location: [facility] ORS;  Service: Vascular;  Laterality: Right;   FEMORAL-TIBIAL BYPASS GRAFT Right 03/23/2020   Procedure: BYPASS GRAFT RIGHT FEMORAL- DISTAL TIBIAL ARTERY WITH DISTAL FLOW GRAFT;  Surgeon: Katha Cabal, MD;  Location: [facility] ORS;  Service: Vascular;  Laterality: Right;   LEFT HEART CATH  AND CORONARY ANGIOGRAPHY Left 05/16/2021   Procedure: LEFT HEART CATH AND CORONARY ANGIOGRAPHY;  Surgeon: Andrez Grime, MD;  Location: [place] [facility];  Service:  Cardiovascular;  Laterality: Left;   LOWER EXTREMITY ANGIOGRAPHY Right 11/18/2016   Procedure: Lower Extremity Angiography;  Surgeon: Katha Cabal, MD;  Location: [place] [facility];  Service: Cardiovascular;  Laterality: Right;   LOWER EXTREMITY ANGIOGRAPHY Right 12/09/2016   Procedure: Lower Extremity Angiography;  Surgeon: Katha Cabal, MD;  Location: [place] [facility];  Service: Cardiovascular;  Laterality: Right;   LOWER EXTREMITY ANGIOGRAPHY Right 11/15/2019   Procedure: LOWER EXTREMITY ANGIOGRAPHY;  Surgeon: Katha Cabal, MD;  Location: [place] [facility];  Service: Cardiovascular;  Laterality: Right;   LOWER EXTREMITY ANGIOGRAPHY Right 12/01/2019   Procedure: Lower Extremity Angiography;  Surgeon: Algernon Huxley, MD;  Location: [place] [facility];  Service: Cardiovascular;  Laterality: Right;   LOWER EXTREMITY ANGIOGRAPHY Right 12/02/2019   Procedure: LOWER EXTREMITY ANGIOGRAPHY;  Surgeon: Katha Cabal, MD;  Location: [place] [facility];  Service: Cardiovascular;  Laterality: Right;   LOWER EXTREMITY ANGIOGRAPHY Right 03/23/2020   Procedure: Lower Extremity Angiography;  Surgeon: Katha Cabal, MD;  Location: [place] [facility];  Service: Cardiovascular;  Laterality: Right;   LOWER EXTREMITY INTERVENTION  11/18/2016   Procedure: Lower Extremity Intervention;  Surgeon: Katha Cabal, MD;  Location: [place] [facility];  Service: Cardiovascular;;   PERIPHERAL VASCULAR CATHETERIZATION Right 01/02/2015   Procedure: Lower Extremity Angiography;  Surgeon: Katha Cabal, MD;  Location: [place] [facility];  Service: Cardiovascular;  Laterality: Right;   PERIPHERAL VASCULAR CATHETERIZATION Right 06/18/2015   Procedure: Lower Extremity Angiography;  Surgeon: Algernon Huxley, MD;  Location: [place] [facility];  Service: Cardiovascular;  Laterality: Right;   PERIPHERAL VASCULAR CATHETERIZATION  06/18/2015   Procedure: Lower Extremity Intervention;  Surgeon: Algernon Huxley, MD;  Location: [place] [facility];  Service: Cardiovascular;;   PERIPHERAL VASCULAR CATHETERIZATION N/A 10/09/2015   Procedure: Abdominal Aortogram w/Lower Extremity;  Surgeon: Katha Cabal, MD;  Location: [place] [facility];  Service: Cardiovascular;  Laterality: N/A;   PERIPHERAL VASCULAR CATHETERIZATION  10/09/2015   Procedure: Lower Extremity Intervention;  Surgeon: Katha Cabal, MD;  Location: [place] [facility];  Service: Cardiovascular;;   PERIPHERAL VASCULAR CATHETERIZATION Right 10/10/2015   Procedure: Lower Extremity Angiography;  Surgeon: Algernon Huxley, MD;  Location: [place] [facility];  Service: Cardiovascular;  Laterality: Right;   PERIPHERAL VASCULAR CATHETERIZATION Right 10/10/2015   Procedure: Lower Extremity Intervention;  Surgeon: Algernon Huxley, MD;  Location: [place] [facility];  Service: Cardiovascular;  Laterality: Right;   PERIPHERAL VASCULAR CATHETERIZATION Right 12/03/2015   Procedure: Lower Extremity Angiography;  Surgeon: Algernon Huxley, MD;  Location: [place] [facility];  Service: Cardiovascular;  Laterality: Right;   PERIPHERAL VASCULAR CATHETERIZATION Right 12/04/2015   Procedure: Lower Extremity Angiography;  Surgeon: Algernon Huxley, MD;  Location: [place] [facility];  Service: Cardiovascular;  Laterality: Right;   PERIPHERAL VASCULAR CATHETERIZATION  12/04/2015   Procedure: Lower Extremity Intervention;  Surgeon: Algernon Huxley, MD;  Location: [place] [facility];  Service: Cardiovascular;;   PERIPHERAL VASCULAR CATHETERIZATION Right 06/30/2016   Procedure: Lower Extremity Angiography;  Surgeon: Algernon Huxley, MD;  Location: [place] [facility];  Service: Cardiovascular;  Laterality: Right;   PERIPHERAL VASCULAR CATHETERIZATION Right 06/25/2016   Procedure: Lower Extremity Angiography;  Surgeon: Katha Cabal, MD;  Location: [place] [facility];  Service: Cardiovascular;  Laterality: Right;   PERIPHERAL VASCULAR CATHETERIZATION Right 07/01/2016    Procedure: Lower Extremity Angiography;  Surgeon: Katha Cabal, MD;  Location: [place] [facility];  Service: Cardiovascular;  Laterality: Right;   PERIPHERAL VASCULAR CATHETERIZATION  07/01/2016   Procedure: Lower Extremity Intervention;  Surgeon: Katha Cabal, MD;  Location: [place] [facility];  Service: Cardiovascular;;   PERIPHERAL VASCULAR CATHETERIZATION Right 08/01/2016   Procedure: Lower Extremity Angiography;  Surgeon: Katha Cabal, MD;  Location: [place] [facility];  Service: Cardiovascular;  Laterality: Right;   PORTA CATH INSERTION N/A 10/05/2020   Procedure: PORTA CATH INSERTION;  Surgeon: Katha Cabal, MD;  Location: [place] [facility];  Service: Cardiovascular;  Laterality: N/A;   stent placement in right leg Right    VIDEO BRONCHOSCOPY N/A 09/14/2020   Procedure: VIDEO BRONCHOSCOPY WITH FLUORO;  Surgeon: Tyler Pita, MD;  Location: [facility]S;  Service: Cardiopulmonary;  Laterality: N/A;   VIDEO BRONCHOSCOPY WITH ENDOBRONCHIAL ULTRASOUND N/A 09/14/2020   Procedure: VIDEO BRONCHOSCOPY WITH ENDOBRONCHIAL ULTRASOUND;  Surgeon: Tyler Pita, MD;  Location: [facility];  Service: Cardiopulmonary;  Laterality: N/A;    SOCIAL HISTORY: Social History   Socioeconomic History   Marital status: Single    Spouse name: Not on file   Number of children: Not on file   Years of education: Not on file   Highest education level: Not on file  Occupational History   Occupation: unemployed  Tobacco Use   Smoking status: Former    Packs/day: 2.00    Years: 38.00    Pack years: 76.00    Types: Cigarettes    Quit date: 12/20/2014    Years since quitting: 6.7   Smokeless tobacco: Never   Tobacco comments:    quit smoking in 2017 most smoked 2   Vaping Use   Vaping Use: Former  Substance and Sexual Activity   Alcohol use: Not Currently   Drug use: No   Sexual activity: Yes  Other Topics Concern   Not on file  Social History Narrative   Not on file    Social Determinants of Health   Financial Resource Strain: Not on file  Food Insecurity: Not on file  Transportation Needs: Not on file  Physical Activity: Not on file  Stress: Not on file  Social Connections: Not on file  Intimate Partner Violence: Not on file    FAMILY HISTORY: Family History  Problem Relation Age of Onset   Diabetes Mother    Hypertension Mother    Heart murmur Mother    Leukemia Mother    Throat cancer Maternal Grandmother    Colon cancer Neg Hx    Breast cancer Neg Hx     ALLERGIES:  is allergic to tape.  MEDICATIONS:  Current Outpatient Medications  Medication Sig Dispense Refill   acetaminophen (TYLENOL) 325 MG tablet Take 650 mg by mouth every 6 (six) hours as needed for moderate pain.     clopidogrel (PLAVIX) 75 MG tablet TAKE 1 TABLET BY MOUTH  DAILY 90 tablet 3   ELIQUIS 5 MG TABS tablet TAKE 1 TABLET BY MOUTH  TWICE DAILY 120 tablet 5   levothyroxine (SYNTHROID) 75 MCG tablet TAKE 1 TABLET BY MOUTH ONCE DAILY BEFORE BREAKFAST 30 tablet 1   lidocaine-prilocaine (EMLA) cream Apply to affected area once (Patient taking differently: Apply 1 application topically daily as needed (port access).) 30 g 3   potassium chloride  SA (KLOR-CON M) 20 MEQ tablet Take 1 tablet (20 mEq total) by mouth daily. 30 tablet 1   simvastatin (ZOCOR) 40 MG tablet Take 40 mg by mouth at bedtime.     Tiotropium Bromide-Olodaterol (STIOLTO RESPIMAT) 2.5-2.5 MCG/ACT AERS Inhale 2 puffs into the lungs daily. (Patient not taking: Reported on 09/13/2021) 4 g 0   No current facility-administered medications for this visit.     PHYSICAL EXAMINATION: ECOG PERFORMANCE STATUS: 1 - Symptomatic but completely ambulatory Vitals:   09/13/21 0915  BP: (!) 161/88  Pulse: 76  Resp: 16  Temp: (!) 97.3 F (36.3 C)  SpO2: 99%   Filed Weights   09/13/21 0915  Weight: 208 lb 14.4 oz (94.8 kg)    Physical Exam Constitutional:      General: He is not in acute distress.     Comments: Patient ambulates with a cane  HENT:     Head: Normocephalic and atraumatic.  Eyes:     General: No scleral icterus. Cardiovascular:     Rate and Rhythm: Normal rate and regular rhythm.     Heart sounds: Normal heart sounds.  Pulmonary:     Effort: Pulmonary effort is normal. No respiratory distress.     Breath sounds: No wheezing.  Abdominal:     General: There is no distension.     Palpations: Abdomen is soft.  Musculoskeletal:        General: No deformity. Normal range of motion.     Cervical back: Normal range of motion and neck supple.  Skin:    General: Skin is warm and dry.     Findings: No erythema or rash.  Neurological:     Mental Status: He is alert and oriented to person, place, and time. Mental status is at baseline.     Cranial Nerves: No cranial nerve deficit.     Coordination: Coordination normal.  Psychiatric:        Mood and Affect: Mood normal.    LABORATORY DATA:  I have reviewed the data as listed Lab Results  Component Value Date   WBC 4.9 09/13/2021   HGB 12.3 (L) 09/13/2021   HCT 37.5 (L) 09/13/2021   MCV 88.7 09/13/2021   PLT 157 09/13/2021   Recent Labs    08/16/21 0820 08/30/21 0816 09/13/21 0847  NA 142 140 140  K 4.0 3.7 3.6  CL 107 106 106  CO2 $Re'26 25 25  'dLX$ GLUCOSE 116* 124* 108*  BUN $Re'22 21 11  'geJ$ CREATININE 1.36* 1.15 1.11  CALCIUM 9.2 9.2 8.8*  GFRNONAA 58* >60 >60  PROT 7.4 7.1 7.1  ALBUMIN 4.1 4.0 4.0  AST $Re'22 19 28  'AFK$ ALT $R'18 14 18  'Db$ ALKPHOS 73 68 65  BILITOT 0.5 0.3 0.3    Iron/TIBC/Ferritin/ %Sat    Component Value Date/Time   IRON 48 08/30/2020 1052   TIBC 391 08/30/2020 1052   FERRITIN 25 08/30/2020 1052   IRONPCTSAT 12 (L) 08/30/2020 1052      RADIOGRAPHIC STUDIES: I have personally reviewed the radiological images as listed and agreed with the findings in the report. CT SOFT TISSUE NECK W CONTRAST  Result Date: 07/10/2021 CLINICAL DATA:  66 year old male with history of non-small cell right upper lobe  lung cancer. Right lung radiation. Left-side palpable mass. EXAM: CT NECK WITH CONTRAST TECHNIQUE: Multidetector CT imaging of the neck was performed using the standard protocol following the bolus administration of intravenous contrast. CONTRAST:  4mL ISOVUE-300 IOPAMIDOL (ISOVUE-300) INJECTION 61% COMPARISON:  Cervical  spine MRI 02/06/2020.  Chest CT 05/09/2021. FINDINGS: Pharynx and larynx: Negative larynx and pharynx. Parapharyngeal and retropharyngeal spaces are normal. Salivary glands: Negative sublingual space. Right side submandibular and parotid glands are within normal limits. Cystic mass with thin but irregular rim of soft tissue (series 6, image 39 and series 5, image 83) measures up to 29 mm diameter and is situated between the left submandibular and inferior parotid glands in the left neck on axial series 3, image 41. The lesion is inseparable from both glands, possibly with a "claw sign" with the submandibular gland on sagittal image 81. But the underlying left side salivary gland parenchyma elsewhere is normal. No regional inflammation. This area is not clearly visible on the 2019 cervical spine MRI. And there is no other neck cross-sectional imaging. Thyroid: Diminutive, negative. Lymph nodes: No solid cervical lymphadenopathy superimposed on the left side cystic mass detailed above. Bilateral cervical nodes are diminutive. Vascular: Right IJ Port-A-Cath is partially visible. Major vascular structures in the neck and at the skull base are patent. Left vertebral artery is dominant and the right is diminutive at the skull base. Limited intracranial: Negative. Visualized orbits: Not included. Mastoids and visualized paranasal sinuses: Visible sinuses are well aerated. There is mild right maxillary alveolar recess mucosal thickening. Visible tympanic cavities and mastoids are clear. Skeleton: Left mandible appears intact and normal near the cystic mass. Previous cervical ACDF C4 through C7 with  evidence of solid arthrodesis. Other cervical spine degeneration. No acute or suspicious osseous lesion identified. Upper chest: Stable compared to the October chest CT. No superior mediastinal lymphadenopathy. IMPRESSION: 1. Left neck cystic mass with soft tissue rim is 2.9 cm and situated directly between the left submandibular and inferior parotid glands, inseparable from both. Indirect evidence the lesion may have originated from the [facility]. No regional inflammation or lymphadenopathy. Background gland parenchyma, and the right side salivary glands appear normal. 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 2. Right jugular Port-A-Cath in place. Stable visible post treatment appearance of the upper chest. Previous cervical ACDF. Electronically Signed   By: Genevie Ann M.D.   On: 07/10/2021 08:48   CT CHEST WO CONTRAST  Result Date: 08/24/2021 CLINICAL DATA:  Lung cancer.  Squamous cell carcinoma lung. EXAM: CT CHEST WITHOUT CONTRAST TECHNIQUE: Multidetector CT imaging of the chest was performed following the standard protocol without IV contrast. RADIATION DOSE REDUCTION: This exam was performed according to the departmental dose-optimization program which includes automated exposure control, adjustment of the mA and/or kV according to patient size and/or use of iterative reconstruction technique. COMPARISON:  CT 10 2022 FINDINGS: Cardiovascular: Port in the anterior chest wall with tip in distal SVC. Coronary artery calcification and aortic atherosclerotic calcification. Mediastinum/Nodes: No axillary or supraclavicular adenopathy. No mediastinal or hilar adenopathy. No pericardial fluid. Esophagus normal. Lungs/Pleura: RIGHT perihilar consolidation with air bronchograms in a pattern typical of post radiation change. No new nodularity or interval growth. Ground-glass nodule in the RIGHT upper lobe measuring 10 mm (image 50/3) is new from prior. More superior ground-glass nodule  identified on comparison exam is no longer measurable. Small ground-glass nodule in the RIGHT upper lobe just anterior to the fissure (image 61/3) is also new from prior. Upper Abdomen: Limited view of the liver, kidneys, pancreas are unremarkable. Normal adrenal glands. Musculoskeletal: No aggressive osseous lesion. IMPRESSION: 1. Stable post radiation change about the RIGHT hilum. 2. Two new ground-glass nodules in the RIGHT upper lobe. Resolution of previous described more superior  ground-glass nodule in the same vicinity. Waxing and waning ground-glass nodules typically indicates a benign infectious or inflammatory process. Recommend continued surveillance of these nodules. Electronically Signed   By: Suzy Bouchard M.D.   On: 08/24/2021 09:13   Pulmonary Function Test [facility] Only  Result Date: 07/20/2021 Spirometry Data Is Acceptable and Reproducible CONSIDER MILD Moderate Obstructive Airways Disease without  Significant Broncho-Dilator Response Also CONSIDER Moderate Restrictive Lung disease Consider outpatient Pulmonary Consultation if needed Clinical Correlation Advised   [place] CORE BIOPSY (SOFT TISSUE)  Result Date: 08/07/2021 INDICATION: Enlarging left-sided parotid nodule. History of previous ultrasound-guided core needle biopsy on 11/07/2020. Please perform repeat ultrasound-guided biopsy for tissue diagnostic purposes. EXAM: ULTRASOUND-GUIDED LEFT PAROTID NODULE BIOPSY COMPARISON:  Neck CT-07/09/2021 Ultrasound-guided left parotid nodule biopsy-11/07/2020 MEDICATIONS: None ANESTHESIA/SEDATION: None COMPLICATIONS: None immediate. TECHNIQUE: Informed written consent was obtained from the patient after a discussion of the risks, benefits and alternatives to treatment. Questions regarding the procedure were encouraged and answered. Initial ultrasound scanning demonstrated unchanged size and appearance of the approximately 2.8 x 1.7 x 2.2 cm nodule adjacent to the anterior inferior aspect the left  parotid gland (images 2 through 11), similar to neck CT performed 07/09/2021, image 42, series 3. An ultrasound image was saved for documentation purposes. The procedure was planned. A timeout was performed prior to the initiation of the procedure. The operative was prepped and draped in the usual sterile fashion, and a sterile drape was applied covering the operative field. A timeout was performed prior to the initiation of the procedure. Local anesthesia was provided with 1% lidocaine with epinephrine. Under direct ultrasound guidance, an 18 gauge core needle device was utilized to obtain to obtain 6 core needle biopsies of the indeterminate left parotid gland nodule. The samples were placed in saline and submitted to pathology. The needle was removed and hemostasis was achieved with manual compression. Post procedure scan was negative for significant hematoma. A dressing was applied. The patient tolerated the procedure well without immediate postprocedural complication. IMPRESSION: Technically successful ultrasound guided biopsy of indeterminate left parotid gland nodule Electronically Signed   By: Sandi Mariscal M.D.   On: 08/07/2021 16:40       ASSESSMENT & PLAN:  1. Squamous cell carcinoma of right lung ([place])   2. Encounter for antineoplastic immunotherapy   3. Hypothyroidism due to medication    Cancer Staging  Squamous cell carcinoma of right lung [facility]) Staging form: Lung, AJCC 8th Edition - Clinical stage from 09/20/2020: Stage IIIA (cT1b, cN2, cM0) - Signed by [name], MD on 09/20/2020   #Squamous cell carcinoma of right lung, stage IIIA, Labs reviewed and discussed with patient.  Proceed with Durvalumab.  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  Free T4 is decreased and TSH is further increased.  Recommend patient to increase levothyroxine to 88 mcg daily.                                                           All questions were answered. The patient knows to call the clinic with any problems questions or concerns.  cc [name], MD   Return of visit: Lab MD durvalumab in 2 weeks-  [name], MD, PhD  09/13/2021

## 2021-09-15 ENCOUNTER — Encounter (INDEPENDENT_AMBULATORY_CARE_PROVIDER_SITE_OTHER): Payer: Self-pay | Admitting: Vascular Surgery

## 2021-09-15 NOTE — Progress Notes (Signed)
MRN : [id number]  Joshua [name] is a 66 y.o. (1955/08/19) male who presents with chief complaint of check legs.  History of Present Illness:   The patient returns to the office for followup and review of the noninvasive studies. There have been no interval changes in lower extremity symptoms. He states his foot pain is gone.  No interval shortening of the patient's claudication distance or development of rest pain symptoms. No new ulcers or wounds have occurred since the last visit.   The patient is s/p a revision of his right leg bypass on 03/23/2020 with a redo right profunda femorus to anterior tibial bypass.  He required thrombectomy on 03/24/2020 but no abnormality was uncovered.  He has done well since that secondary issue.   There have been no significant changes to the patient's overall health care.   The patient denies amaurosis fugax or recent TIA symptoms. There are no recent neurological changes noted. The patient denies history of DVT, PE or superficial thrombophlebitis. The patient denies recent episodes of angina or shortness of breath.    Previous ABI Rt=1.05 and Lt=1.19  (previous ABI's Rt=1.03 and Lt=1.18)  Current Meds  Medication Sig   acetaminophen (TYLENOL) 325 MG tablet Take 650 mg by mouth every 6 (six) hours as needed for moderate pain.   clopidogrel (PLAVIX) 75 MG tablet TAKE 1 TABLET BY MOUTH  DAILY   ELIQUIS 5 MG TABS tablet TAKE 1 TABLET BY MOUTH  TWICE DAILY   lidocaine-prilocaine (EMLA) cream Apply to affected area once (Patient taking differently: Apply 1 application topically daily as needed (port access).)   potassium chloride SA (KLOR-CON M) 20 MEQ tablet Take 1 tablet (20 mEq total) by mouth daily.   simvastatin (ZOCOR) 40 MG tablet Take 40 mg by mouth at bedtime.   Tiotropium Bromide-Olodaterol (STIOLTO RESPIMAT) 2.5-2.5 MCG/ACT AERS Inhale 2 puffs into the lungs daily. (Patient not taking: Reported on 09/13/2021)   [DISCONTINUED] levothyroxine  (SYNTHROID) 75 MCG tablet TAKE 1 TABLET BY MOUTH ONCE DAILY BEFORE BREAKFAST    Past Medical History:  Diagnosis Date   Allergy    Cancer ([place])    lung   Dyspnea    former smoker. only doe   History of kidney stones    Hypertension    Peripheral vascular disease ([place])    Pre-diabetes    Sleep apnea     Past Surgical History:  Procedure Laterality Date   ANTERIOR CERVICAL DECOMP/DISCECTOMY FUSION N/A 03/14/2020   Procedure: ANTERIOR CERVICAL DECOMPRESSION/DISCECTOMY FUSION 3 LEVELS C4-7;  Surgeon: Meade Maw, MD;  Location: [facility] ORS;  Service: Neurosurgery;  Laterality: N/A;   BACK SURGERY  2011   CENTRAL LINE INSERTION Right 09/10/2016   Procedure: CENTRAL LINE INSERTION;  Surgeon: Katha Cabal, MD;  Location: [facility] ORS;  Service: Vascular;  Laterality: Right;   CERVICAL FUSION     C4-c7 on Mar 14, 2020   COLONOSCOPY  2013 ?   COLONOSCOPY N/A 09/09/2021   Procedure: COLONOSCOPY;  Surgeon: [name], DO;  Location: [facility] ENDOSCOPY;  Service: Gastroenterology;  Laterality: N/A;   CORONARY ANGIOPLASTY     ESOPHAGOGASTRODUODENOSCOPY N/A 09/09/2021   Procedure: ESOPHAGOGASTRODUODENOSCOPY (EGD);  Surgeon: [name], DO;  Location: [facility] ENDOSCOPY;  Service: Gastroenterology;  Laterality: N/A;   FEMORAL-POPLITEAL BYPASS GRAFT Right 09/10/2016   Procedure: BYPASS GRAFT FEMORAL-POPLITEAL ARTERY ( BELOW KNEE );  Surgeon: Katha Cabal, MD;  Location: [facility] ORS;  Service: Vascular;  Laterality: Right;   FEMORAL-TIBIAL BYPASS  GRAFT Right 03/23/2020   Procedure: BYPASS GRAFT RIGHT FEMORAL- DISTAL TIBIAL ARTERY WITH DISTAL FLOW GRAFT;  Surgeon: Katha Cabal, MD;  Location: [facility] ORS;  Service: Vascular;  Laterality: Right;   LEFT HEART CATH AND CORONARY ANGIOGRAPHY Left 05/16/2021   Procedure: LEFT HEART CATH AND CORONARY ANGIOGRAPHY;  Surgeon: Andrez Grime, MD;  Location: [place] CV LAB;  Service: Cardiovascular;  Laterality: Left;   LOWER  EXTREMITY ANGIOGRAPHY Right 11/18/2016   Procedure: Lower Extremity Angiography;  Surgeon: Katha Cabal, MD;  Location: [place] [facility] LAB;  Service: Cardiovascular;  Laterality: Right;   LOWER EXTREMITY ANGIOGRAPHY Right 12/09/2016   Procedure: Lower Extremity Angiography;  Surgeon: Katha Cabal, MD;  Location: [place] [facility] LAB;  Service: Cardiovascular;  Laterality: Right;   LOWER EXTREMITY ANGIOGRAPHY Right 11/15/2019   Procedure: LOWER EXTREMITY ANGIOGRAPHY;  Surgeon: Katha Cabal, MD;  Location: [place] [facility] LAB;  Service: Cardiovascular;  Laterality: Right;   LOWER EXTREMITY ANGIOGRAPHY Right 12/01/2019   Procedure: Lower Extremity Angiography;  Surgeon: Algernon Huxley, MD;  Location: [place] CV LAB;  Service: Cardiovascular;  Laterality: Right;   LOWER EXTREMITY ANGIOGRAPHY Right 12/02/2019   Procedure: LOWER EXTREMITY ANGIOGRAPHY;  Surgeon: Katha Cabal, MD;  Location: [place] [facility] LAB;  Service: Cardiovascular;  Laterality: Right;   LOWER EXTREMITY ANGIOGRAPHY Right 03/23/2020   Procedure: Lower Extremity Angiography;  Surgeon: Katha Cabal, MD;  Location: [place] [facility] LAB;  Service: Cardiovascular;  Laterality: Right;   LOWER EXTREMITY INTERVENTION  11/18/2016   Procedure: Lower Extremity Intervention;  Surgeon: Katha Cabal, MD;  Location: [place] [facility] LAB;  Service: Cardiovascular;;   PERIPHERAL VASCULAR CATHETERIZATION Right 01/02/2015   Procedure: Lower Extremity Angiography;  Surgeon: Katha Cabal, MD;  Location: [place] [facility] LAB;  Service: Cardiovascular;  Laterality: Right;   PERIPHERAL VASCULAR CATHETERIZATION Right 06/18/2015   Procedure: Lower Extremity Angiography;  Surgeon: Algernon Huxley, MD;  Location: [place] [facility] LAB;  Service: Cardiovascular;  Laterality: Right;   PERIPHERAL VASCULAR CATHETERIZATION  06/18/2015   Procedure: Lower Extremity Intervention;  Surgeon: Algernon Huxley, MD;  Location: [place] [facility] LAB;   Service: Cardiovascular;;   PERIPHERAL VASCULAR CATHETERIZATION N/A 10/09/2015   Procedure: Abdominal Aortogram w/Lower Extremity;  Surgeon: Katha Cabal, MD;  Location: [place] [facility];  Service: Cardiovascular;  Laterality: N/A;   PERIPHERAL VASCULAR CATHETERIZATION  10/09/2015   Procedure: Lower Extremity Intervention;  Surgeon: Katha Cabal, MD;  Location: [place] [facility] LAB;  Service: Cardiovascular;;   PERIPHERAL VASCULAR CATHETERIZATION Right 10/10/2015   Procedure: Lower Extremity Angiography;  Surgeon: Algernon Huxley, MD;  Location: [place] [facility] LAB;  Service: Cardiovascular;  Laterality: Right;   PERIPHERAL VASCULAR CATHETERIZATION Right 10/10/2015   Procedure: Lower Extremity Intervention;  Surgeon: Algernon Huxley, MD;  Location: [place] [facility] LAB;  Service: Cardiovascular;  Laterality: Right;   PERIPHERAL VASCULAR CATHETERIZATION Right 12/03/2015   Procedure: Lower Extremity Angiography;  Surgeon: Algernon Huxley, MD;  Location: [place] [facility] LAB;  Service: Cardiovascular;  Laterality: Right;   PERIPHERAL VASCULAR CATHETERIZATION Right 12/04/2015   Procedure: Lower Extremity Angiography;  Surgeon: Algernon Huxley, MD;  Location: [place] [facility] LAB;  Service: Cardiovascular;  Laterality: Right;   PERIPHERAL VASCULAR CATHETERIZATION  12/04/2015   Procedure: Lower Extremity Intervention;  Surgeon: Algernon Huxley, MD;  Location: [place] [facility] LAB;  Service: Cardiovascular;;   PERIPHERAL VASCULAR CATHETERIZATION Right 06/30/2016   Procedure: Lower Extremity  Angiography;  Surgeon: Algernon Huxley, MD;  Location: [place] [facility];  Service: Cardiovascular;  Laterality: Right;   PERIPHERAL VASCULAR CATHETERIZATION Right 06/25/2016   Procedure: Lower Extremity Angiography;  Surgeon: Katha Cabal, MD;  Location: [place] [facility];  Service: Cardiovascular;  Laterality: Right;   PERIPHERAL VASCULAR CATHETERIZATION Right 07/01/2016   Procedure: Lower Extremity Angiography;   Surgeon: Katha Cabal, MD;  Location: [place] [facility];  Service: Cardiovascular;  Laterality: Right;   PERIPHERAL VASCULAR CATHETERIZATION  07/01/2016   Procedure: Lower Extremity Intervention;  Surgeon: Katha Cabal, MD;  Location: [place] [facility] LAB;  Service: Cardiovascular;;   PERIPHERAL VASCULAR CATHETERIZATION Right 08/01/2016   Procedure: Lower Extremity Angiography;  Surgeon: Katha Cabal, MD;  Location: [place] [facility] LAB;  Service: Cardiovascular;  Laterality: Right;   PORTA CATH INSERTION N/A 10/05/2020   Procedure: PORTA CATH INSERTION;  Surgeon: Katha Cabal, MD;  Location: [place] [facility] LAB;  Service: Cardiovascular;  Laterality: N/A;   stent placement in right leg Right    VIDEO BRONCHOSCOPY N/A 09/14/2020   Procedure: VIDEO BRONCHOSCOPY WITH FLUORO;  Surgeon: Tyler Pita, MD;  Location: [facility] ORS;  Service: Cardiopulmonary;  Laterality: N/A;   VIDEO BRONCHOSCOPY WITH ENDOBRONCHIAL ULTRASOUND N/A 09/14/2020   Procedure: VIDEO BRONCHOSCOPY WITH ENDOBRONCHIAL ULTRASOUND;  Surgeon: Tyler Pita, MD;  Location: [facility] ORS;  Service: Cardiopulmonary;  Laterality: N/A;    Social History Social History   Tobacco Use   Smoking status: Former    Packs/day: 2.00    Years: 38.00    Pack years: 76.00    Types: Cigarettes    Quit date: 12/20/2014    Years since quitting: 6.7   Smokeless tobacco: Never   Tobacco comments:    quit smoking in 2017 most smoked 2   Vaping Use   Vaping Use: Former  Substance Use Topics   Alcohol use: Not Currently   Drug use: No    Family History Family History  Problem Relation Age of Onset   Diabetes Mother    Hypertension Mother    Heart murmur Mother    Leukemia Mother    Throat cancer Maternal Grandmother    Colon cancer Neg Hx    Breast cancer Neg Hx     Allergies  Allergen Reactions   Tape     [place] Dressing tape- burns his skin      REVIEW OF SYSTEMS (Negative unless  checked)  Constitutional: [] Weight loss  [] Fever  [] Chills Cardiac: [] Chest pain   [] Chest pressure   [] Palpitations   [] Shortness of breath when laying flat   [] Shortness of breath with exertion. Vascular:  [] Pain in legs with walking   [] Pain in legs at rest  [] History of DVT   [] Phlebitis   [] Swelling in legs   [] Varicose veins   [] Non-healing ulcers Pulmonary:   [] Uses home oxygen   [] Productive cough   [] Hemoptysis   [] Wheeze  [] COPD   [] Asthma Neurologic:  [] Dizziness   [] Seizures   [] History of stroke   [] History of TIA  [] Aphasia   [] Vissual changes   [] Weakness or numbness in arm   [] Weakness or numbness in leg Musculoskeletal:   [] Joint swelling   [] Joint pain   [] Low back pain Hematologic:  [] Easy bruising  [] Easy bleeding   [] Hypercoagulable state   [] Anemic Gastrointestinal:  [] Diarrhea   [] Vomiting  [] Gastroesophageal reflux/heartburn   [] Difficulty swallowing. Genitourinary:  [] Chronic kidney disease   [] Difficult urination  [] Frequent urination   []   Blood in urine Skin:  [] Rashes   [] Ulcers  Psychological:  [] History of anxiety   []  History of major depression.  Physical Examination  Vitals:   09/12/21 0901  BP: (!) 156/87  Pulse: 76  Weight: 206 lb (93.4 kg)  Height: 5\' 11"  (1.803 m)   Body mass index is 28.73 kg/m. Gen: WD/WN, NAD Head: [place]/AT, No temporalis wasting.  Ear/Nose/Throat: Hearing grossly intact, nares w/o erythema or drainage Eyes: PER, EOMI, sclera nonicteric.  Neck: Supple, no masses.  No bruit or JVD.  Pulmonary:  Good air movement, no audible wheezing, no use of accessory muscles.  Cardiac: RRR, normal S1, S2, no Murmurs. Vascular:   Vessel Right Left  Radial Palpable Palpable  PT Not Palpable Palpable  DP Not Palpable Palpable  Gastrointestinal: soft, non-distended. No guarding/no peritoneal signs.  Musculoskeletal: M/S 5/5 throughout.  No visible deformity.  Neurologic: CN 2-12 intact. Pain and light touch intact in extremities.  Symmetrical.   Speech is fluent. Motor exam as listed above. Psychiatric: Judgment intact, Mood & affect appropriate for pt's clinical situation. Dermatologic: No rashes or ulcers noted.  No changes consistent with cellulitis.   CBC Lab Results  Component Value Date   WBC 4.9 09/13/2021   HGB 12.3 (L) 09/13/2021   HCT 37.5 (L) 09/13/2021   MCV 88.7 09/13/2021   PLT 157 09/13/2021    BMET    Component Value Date/Time   NA 140 09/13/2021 0847   NA 141 10/14/2019 1101   K 3.6 09/13/2021 0847   CL 106 09/13/2021 0847   CO2 25 09/13/2021 0847   GLUCOSE 108 (H) 09/13/2021 0847   BUN 11 09/13/2021 0847   BUN 15 10/14/2019 1101   BUN 16 07/03/2014 1205   CREATININE 1.11 09/13/2021 0847   CREATININE 1.34 (H) 07/03/2014 1205   CALCIUM 8.8 (L) 09/13/2021 0847   GFRNONAA >60 09/13/2021 0847   GFRNONAA 59 (L) 07/03/2014 1205   GFRAA >60 03/30/2020 0536   GFRAA >60 07/03/2014 1205   Estimated Creatinine Clearance: 79.5 mL/min (by C-G formula based on SCr of 1.11 mg/dL).  COAG Lab Results  Component Value Date   INR 1.2 08/25/2020   INR 1.0 03/05/2020   INR 1.11 06/25/2016    Radiology CT CHEST WO CONTRAST  Result Date: 08/24/2021 CLINICAL DATA:  Lung cancer.  Squamous cell carcinoma lung. EXAM: CT CHEST WITHOUT CONTRAST TECHNIQUE: Multidetector CT imaging of the chest was performed following the standard protocol without IV contrast. RADIATION DOSE REDUCTION: This exam was performed according to the departmental dose-optimization program which includes automated exposure control, adjustment of the mA and/or kV according to patient size and/or use of iterative reconstruction technique. COMPARISON:  CT 10 2022 FINDINGS: Cardiovascular: Port in the anterior chest wall with tip in distal SVC. Coronary artery calcification and aortic atherosclerotic calcification. Mediastinum/Nodes: No axillary or supraclavicular adenopathy. No mediastinal or hilar adenopathy. No pericardial fluid. Esophagus normal.  Lungs/Pleura: RIGHT perihilar consolidation with air bronchograms in a pattern typical of post radiation change. No new nodularity or interval growth. Ground-glass nodule in the RIGHT upper lobe measuring 10 mm (image 50/3) is new from prior. More superior ground-glass nodule identified on comparison exam is no longer measurable. Small ground-glass nodule in the RIGHT upper lobe just anterior to the fissure (image 61/3) is also new from prior. Upper Abdomen: Limited view of the liver, kidneys, pancreas are unremarkable. Normal adrenal glands. Musculoskeletal: No aggressive osseous lesion. IMPRESSION: 1. Stable post radiation change about the RIGHT hilum. 2.  Two new ground-glass nodules in the RIGHT upper lobe. Resolution of previous described more superior ground-glass nodule in the same vicinity. Waxing and waning ground-glass nodules typically indicates a benign infectious or inflammatory process. Recommend continued surveillance of these nodules. Electronically Signed   By: Suzy Bouchard M.D.   On: 08/24/2021 09:13     Assessment/Plan 1. Atherosclerosis of lower extremity with claudication ([place]) Recommend:   The patient has evidence of atherosclerosis of the lower extremities with claudication.  The patient does not voice lifestyle limiting changes at this point in time.   Noninvasive studies do not suggest clinically significant change.   No invasive studies, angiography or surgery at this time The patient should continue walking and begin a more formal exercise program.  The patient should continue antiplatelet therapy and aggressive treatment of the lipid abnormalities   No changes in the patient's medications at this time   The patient should continue wearing graduated compression socks 10-15 mmHg strength to control the mild edema.  Patient will follow-up in 6 months. - VAS [place] ABI WITH/WO TBI; Future  2. Primary hypertension Continue antihypertensive medications as already ordered,  these medications have been reviewed and there are no changes at this time.   3. Chronic left-sided low back pain without sciatica Continue NSAID medications as already ordered, these medications have been reviewed and there are no changes at this time.  Continued activity and therapy was stressed.   4. Mixed hyperlipidemia Continue statin as ordered and reviewed, no changes at this time     [name], MD  09/15/2021 1:33 PM

## 2021-09-16 ENCOUNTER — Telehealth: Payer: Self-pay

## 2021-09-16 NOTE — Telephone Encounter (Signed)
-----   Message from [name], MD sent at 09/13/2021  4:38 PM EST ----- Please let him know that his thyroid level is still low and I increase his levothyroxin dose to 72mcg.  Rx sent to [place]. This dose may further needs to be adjusted that's why we sent rx to local pharmacy instead of mail delivery.

## 2021-09-16 NOTE — Telephone Encounter (Signed)
Pt informed and verbalized understanding

## 2021-09-18 ENCOUNTER — Telehealth (INDEPENDENT_AMBULATORY_CARE_PROVIDER_SITE_OTHER): Payer: Self-pay | Admitting: Vascular Surgery

## 2021-09-18 NOTE — Telephone Encounter (Signed)
Bring him in with RLE Art duplex tomorrow if possible

## 2021-09-18 NOTE — Telephone Encounter (Signed)
Right calf is cramping up and his big middle and ring toes are turned in. He thinks he has another blood clot and it hurts when he walks on it.  He wants to make an appointment ?

## 2021-09-19 NOTE — Telephone Encounter (Signed)
He is scheduled for tomorrow 3.3.23 at 7:30 ?

## 2021-09-20 ENCOUNTER — Encounter (INDEPENDENT_AMBULATORY_CARE_PROVIDER_SITE_OTHER): Payer: Self-pay | Admitting: Nurse Practitioner

## 2021-09-20 ENCOUNTER — Other Ambulatory Visit: Payer: Self-pay

## 2021-09-20 ENCOUNTER — Telehealth (INDEPENDENT_AMBULATORY_CARE_PROVIDER_SITE_OTHER): Payer: Self-pay

## 2021-09-20 ENCOUNTER — Other Ambulatory Visit (INDEPENDENT_AMBULATORY_CARE_PROVIDER_SITE_OTHER): Payer: Self-pay | Admitting: Vascular Surgery

## 2021-09-20 ENCOUNTER — Ambulatory Visit (INDEPENDENT_AMBULATORY_CARE_PROVIDER_SITE_OTHER): Payer: Medicare Other | Admitting: Nurse Practitioner

## 2021-09-20 ENCOUNTER — Ambulatory Visit (INDEPENDENT_AMBULATORY_CARE_PROVIDER_SITE_OTHER): Payer: Medicare Other

## 2021-09-20 VITALS — BP 152/82 | HR 81 | Resp 16 | Wt 207.0 lb

## 2021-09-20 DIAGNOSIS — I70321 Atherosclerosis of unspecified type of bypass graft(s) of the extremities with rest pain, right leg: Secondary | ICD-10-CM

## 2021-09-20 DIAGNOSIS — E782 Mixed hyperlipidemia: Secondary | ICD-10-CM | POA: Diagnosis not present

## 2021-09-20 DIAGNOSIS — I10 Essential (primary) hypertension: Secondary | ICD-10-CM | POA: Diagnosis not present

## 2021-09-20 DIAGNOSIS — I70221 Atherosclerosis of native arteries of extremities with rest pain, right leg: Secondary | ICD-10-CM

## 2021-09-20 NOTE — Progress Notes (Signed)
Subjective:    Patient ID: Joshua [name], male    DOB: 03-28-1956, 66 y.o.   MRN: [id number] Chief Complaint  Patient presents with   Follow-up    Ultrasound follow up    Joshua [name] is a 66 year old male that contacted our office with significant pain in his right calf and cramping in his toes.  He also notes that he has severe claudication-like symptoms.  The patient has had history of multiple endovascular interventions.  He also has femoro tibial bypass graft in his right lower extremity.  He denies any open wounds or ulcerations.  He denies any discoloration of the lower extremity.  The patient notes that he underwent a colonoscopy on 09/09/2021 and had to hold his anticoagulation for several days prior to.  Before that he had been compliant with his medication.  He is currently on Plavix and aspirin.  The patient is also undergoing treatment for his stage III squamous cell lung cancer.  Today noninvasive studies show that his right Pham/anterior tibial artery bypass graft is occluded from the region.   Review of Systems  Cardiovascular:        Claudication and rest pain  All other systems reviewed and are negative.     Objective:   Physical Exam Vitals reviewed.  HENT:     Head: Normocephalic.  Cardiovascular:     Rate and Rhythm: Normal rate.     Pulses:          Dorsalis pedis pulses are 0 on the right side.       Posterior tibial pulses are 0 on the right side.  Pulmonary:     Effort: Pulmonary effort is normal.  Neurological:     Mental Status: He is alert and oriented to person, place, and time.     Motor: Weakness present.     Gait: Gait abnormal.  Psychiatric:        Mood and Affect: Mood normal.        Behavior: Behavior normal.        Thought Content: Thought content normal.        Judgment: Judgment normal.    BP (!) 152/82 (BP Location: Left Arm)    Pulse 81    Resp 16    Wt 207 lb (93.9 kg)    BMI 28.87 kg/m   Past Medical History:   Diagnosis Date   Allergy    Cancer ([place])    lung   Dyspnea    former smoker. only doe   History of kidney stones    Hypertension    Peripheral vascular disease ([place])    Pre-diabetes    Sleep apnea     Social History   Socioeconomic History   Marital status: Single    Spouse name: Not on file   Number of children: Not on file   Years of education: Not on file   Highest education level: Not on file  Occupational History   Occupation: unemployed  Tobacco Use   Smoking status: Former    Packs/day: 2.00    Years: 38.00    Pack years: 76.00    Types: Cigarettes    Quit date: 12/20/2014    Years since quitting: 6.7   Smokeless tobacco: Never   Tobacco comments:    quit smoking in 2017 most smoked 2   Vaping Use   Vaping Use: Former  Substance and Sexual Activity   Alcohol use: Not Currently   Drug use: No  Sexual activity: Yes  Other Topics Concern   Not on file  Social History Narrative   Not on file   Social Determinants of Health   Financial Resource Strain: Not on file  Food Insecurity: Not on file  Transportation Needs: Not on file  Physical Activity: Not on file  Stress: Not on file  Social Connections: Not on file  Intimate Partner Violence: Not on file    Past Surgical History:  Procedure Laterality Date   ANTERIOR CERVICAL DECOMP/DISCECTOMY FUSION N/A 03/14/2020   Procedure: ANTERIOR CERVICAL DECOMPRESSION/DISCECTOMY FUSION 3 LEVELS C4-7;  Surgeon: Meade Maw, MD;  Location: [facility] ORS;  Service: Neurosurgery;  Laterality: N/A;   BACK SURGERY  2011   CENTRAL LINE INSERTION Right 09/10/2016   Procedure: CENTRAL LINE INSERTION;  Surgeon: Katha Cabal, MD;  Location: [facility];  Service: Vascular;  Laterality: Right;   CERVICAL FUSION     C4-c7 on Mar 14, 2020   COLONOSCOPY  2013 ?   COLONOSCOPY N/A 09/09/2021   Procedure: COLONOSCOPY;  Surgeon: [name], DO;  Location: [facility] ENDOSCOPY;  Service: Gastroenterology;  Laterality:  N/A;   CORONARY ANGIOPLASTY     ESOPHAGOGASTRODUODENOSCOPY N/A 09/09/2021   Procedure: ESOPHAGOGASTRODUODENOSCOPY (EGD);  Surgeon: [name], DO;  Location[facility]COPY;  Service: Gastroenterology;  Laterality: N/A;   FEMORAL-POPLITEAL BYPASS GRAFT Right 09/10/2016   Procedure: BYPASS GRAFT FEMORAL-POPLITEAL ARTERY ( BELOW KNEE );  Surgeon: Katha Cabal, MD;  Location: [facility];  Service: Vascular;  Laterality: Right;   FEMORAL-TIBIAL BYPASS GRAFT Right 03/23/2020   Procedure: BYPASS GRAFT RIGHT FEMORAL- DISTAL TIBIAL ARTERY WITH DISTAL FLOW GRAFT;  Surgeon: Katha Cabal, MD;  Location: [facility]S;  Service: Vascular;  Laterality: Right;   LEFT HEART CATH AND CORONARY ANGIOGRAPHY Left 05/16/2021   Procedure: LEFT HEART CATH AND CORONARY ANGIOGRAPHY;  Surgeon: Andrez Grime, MD;  Location: [place] [facility];  Service: Cardiovascular;  Laterality: Left;   LOWER EXTREMITY ANGIOGRAPHY Right 11/18/2016   Procedure: Lower Extremity Angiography;  Surgeon: Katha Cabal, MD;  Location: [place] [facility];  Service: Cardiovascular;  Laterality: Right;   LOWER EXTREMITY ANGIOGRAPHY Right 12/09/2016   Procedure: Lower Extremity Angiography;  Surgeon: Katha Cabal, MD;  Location: [place] [facility];  Service: Cardiovascular;  Laterality: Right;   LOWER EXTREMITY ANGIOGRAPHY Right 11/15/2019   Procedure: LOWER EXTREMITY ANGIOGRAPHY;  Surgeon: Katha Cabal, MD;  Location: [place] [facility];  Service: Cardiovascular;  Laterality: Right;   LOWER EXTREMITY ANGIOGRAPHY Right 12/01/2019   Procedure: Lower Extremity Angiography;  Surgeon: Algernon Huxley, MD;  Location: [place] [facility];  Service: Cardiovascular;  Laterality: Right;   LOWER EXTREMITY ANGIOGRAPHY Right 12/02/2019   Procedure: LOWER EXTREMITY ANGIOGRAPHY;  Surgeon: Katha Cabal, MD;  Location: [place] [facility];  Service: Cardiovascular;  Laterality: Right;   LOWER EXTREMITY ANGIOGRAPHY Right  03/23/2020   Procedure: Lower Extremity Angiography;  Surgeon: Katha Cabal, MD;  Location: [place] [facility];  Service: Cardiovascular;  Laterality: Right;   LOWER EXTREMITY INTERVENTION  11/18/2016   Procedure: Lower Extremity Intervention;  Surgeon: Katha Cabal, MD;  Location: [place] [facility];  Service: Cardiovascular;;   PERIPHERAL VASCULAR CATHETERIZATION Right 01/02/2015   Procedure: Lower Extremity Angiography;  Surgeon: Katha Cabal, MD;  Location: [place] [facility];  Service: Cardiovascular;  Laterality: Right;   PERIPHERAL VASCULAR CATHETERIZATION Right 06/18/2015   Procedure: Lower Extremity Angiography;  Surgeon: Algernon Huxley, MD;  Location: [place] [facility];  Service: Cardiovascular;  Laterality: Right;   PERIPHERAL VASCULAR CATHETERIZATION  06/18/2015   Procedure: Lower Extremity Intervention;  Surgeon: Algernon Huxley, MD;  Location: [place] [facility];  Service: Cardiovascular;;   PERIPHERAL VASCULAR CATHETERIZATION N/A 10/09/2015   Procedure: Abdominal Aortogram w/Lower Extremity;  Surgeon: Katha Cabal, MD;  Location: [place] [facility];  Service: Cardiovascular;  Laterality: N/A;   PERIPHERAL VASCULAR CATHETERIZATION  10/09/2015   Procedure: Lower Extremity Intervention;  Surgeon: Katha Cabal, MD;  Location: [place] [facility];  Service: Cardiovascular;;   PERIPHERAL VASCULAR CATHETERIZATION Right 10/10/2015   Procedure: Lower Extremity Angiography;  Surgeon: Algernon Huxley, MD;  Location: [place] [facility];  Service: Cardiovascular;  Laterality: Right;   PERIPHERAL VASCULAR CATHETERIZATION Right 10/10/2015   Procedure: Lower Extremity Intervention;  Surgeon: Algernon Huxley, MD;  Location: [place] [facility];  Service: Cardiovascular;  Laterality: Right;   PERIPHERAL VASCULAR CATHETERIZATION Right 12/03/2015   Procedure: Lower Extremity Angiography;  Surgeon: Algernon Huxley, MD;  Location: [place] [facility];  Service: Cardiovascular;  Laterality:  Right;   PERIPHERAL VASCULAR CATHETERIZATION Right 12/04/2015   Procedure: Lower Extremity Angiography;  Surgeon: Algernon Huxley, MD;  Location: [place] [facility];  Service: Cardiovascular;  Laterality: Right;   PERIPHERAL VASCULAR CATHETERIZATION  12/04/2015   Procedure: Lower Extremity Intervention;  Surgeon: Algernon Huxley, MD;  Location: [place] [facility];  Service: Cardiovascular;;   PERIPHERAL VASCULAR CATHETERIZATION Right 06/30/2016   Procedure: Lower Extremity Angiography;  Surgeon: Algernon Huxley, MD;  Location: [place] [facility];  Service: Cardiovascular;  Laterality: Right;   PERIPHERAL VASCULAR CATHETERIZATION Right 06/25/2016   Procedure: Lower Extremity Angiography;  Surgeon: Katha Cabal, MD;  Location: [place] [facility];  Service: Cardiovascular;  Laterality: Right;   PERIPHERAL VASCULAR CATHETERIZATION Right 07/01/2016   Procedure: Lower Extremity Angiography;  Surgeon: Katha Cabal, MD;  Location: [place] [facility];  Service: Cardiovascular;  Laterality: Right;   PERIPHERAL VASCULAR CATHETERIZATION  07/01/2016   Procedure: Lower Extremity Intervention;  Surgeon: Katha Cabal, MD;  Location: [place] [facility];  Service: Cardiovascular;;   PERIPHERAL VASCULAR CATHETERIZATION Right 08/01/2016   Procedure: Lower Extremity Angiography;  Surgeon: Katha Cabal, MD;  Location: [place] [facility];  Service: Cardiovascular;  Laterality: Right;   PORTA CATH INSERTION N/A 10/05/2020   Procedure: PORTA CATH INSERTION;  Surgeon: Katha Cabal, MD;  Location: [place] [facility];  Service: Cardiovascular;  Laterality: N/A;   stent placement in right leg Right    VIDEO BRONCHOSCOPY N/A 09/14/2020   Procedure: VIDEO BRONCHOSCOPY WITH FLUORO;  Surgeon: Tyler Pita, MD;  Location: [facility];  Service: Cardiopulmonary;  Laterality: N/A;   VIDEO BRONCHOSCOPY WITH ENDOBRONCHIAL ULTRASOUND N/A 09/14/2020   Procedure: VIDEO BRONCHOSCOPY WITH ENDOBRONCHIAL  ULTRASOUND;  Surgeon: Tyler Pita, MD;  Location: [facility];  Service: Cardiopulmonary;  Laterality: N/A;    Family History  Problem Relation Age of Onset   Diabetes Mother    Hypertension Mother    Heart murmur Mother    Leukemia Mother    Throat cancer Maternal Grandmother    Colon cancer Neg Hx    Breast cancer Neg Hx     Allergies  Allergen Reactions   Tape     [place] Dressing tape- burns his skin     CBC Latest Ref Rng & Units 09/13/2021 08/30/2021 08/16/2021  WBC 4.0 - 10.5 K/uL 4.9  5.2 6.3  Hemoglobin 13.0 - 17.0 g/dL 12.3(L) 12.6(L) 13.0  Hematocrit 39.0 - 52.0 % 37.5(L) 38.7(L) 39.8  Platelets 150 - 400 K/uL 157 176 181      CMP     Component Value Date/Time   NA 140 09/13/2021 0847   NA 141 10/14/2019 1101   K 3.6 09/13/2021 0847   CL 106 09/13/2021 0847   CO2 25 09/13/2021 0847   GLUCOSE 108 (H) 09/13/2021 0847   BUN 11 09/13/2021 0847   BUN 15 10/14/2019 1101   BUN 16 07/03/2014 1205   CREATININE 1.11 09/13/2021 0847   CREATININE 1.34 (H) 07/03/2014 1205   CALCIUM 8.8 (L) 09/13/2021 0847   PROT 7.1 09/13/2021 0847   PROT 7.0 04/16/2020 1525   ALBUMIN 4.0 09/13/2021 0847   ALBUMIN 4.3 04/16/2020 1525   AST 28 09/13/2021 0847   ALT 18 09/13/2021 0847   ALKPHOS 65 09/13/2021 0847   BILITOT 0.3 09/13/2021 0847   BILITOT 0.4 04/16/2020 1525   GFRNONAA >60 09/13/2021 0847   GFRNONAA 59 (L) 07/03/2014 1205   GFRAA >60 03/30/2020 0536   GFRAA >60 07/03/2014 1205     No results found.     Assessment & Plan:   1. Atherosclerosis of native artery of right lower extremity with rest pain (HCC) Recommend:  The patient has evidence of severe atherosclerotic changes of the right lower extremities with rest pain that is associated with preulcerative changes and impending tissue loss of the foot.  This represents a limb threatening ischemia and places the patient at the risk for limb loss.  Patient should undergo angiography of the right lower  extremities with the hope for intervention for limb salvage.  The risks and benefits as well as the alternative therapies was discussed in detail with the patient.  All questions were answered.  Patient agrees to proceed with angiography.  The patient will follow up with me in the office after the procedure.       2. Primary hypertension Continue antihypertensive medications as already ordered, these medications have been reviewed and there are no changes at this time.   3. Mixed hyperlipidemia Continue statin as ordered and reviewed, no changes at this time     Current Outpatient Medications on File Prior to Visit  Medication Sig Dispense Refill   acetaminophen (TYLENOL) 325 MG tablet Take 650 mg by mouth every 6 (six) hours as needed for moderate pain.     clopidogrel (PLAVIX) 75 MG tablet TAKE 1 TABLET BY MOUTH  DAILY 90 tablet 3   ELIQUIS 5 MG TABS tablet TAKE 1 TABLET BY MOUTH  TWICE DAILY 120 tablet 5   levothyroxine (SYNTHROID) 88 MCG tablet Take 1 tablet (88 mcg total) by mouth daily before breakfast. 30 tablet 1   lidocaine-prilocaine (EMLA) cream Apply to affected area once (Patient taking differently: Apply 1 application topically daily as needed (port access).) 30 g 3   potassium chloride SA (KLOR-CON M) 20 MEQ tablet Take 1 tablet (20 mEq total) by mouth daily. 30 tablet 1   simvastatin (ZOCOR) 40 MG tablet Take 40 mg by mouth at bedtime.     Tiotropium Bromide-Olodaterol (STIOLTO RESPIMAT) 2.5-2.5 MCG/ACT AERS Inhale 2 puffs into the lungs daily. (Patient not taking: Reported on 09/13/2021) 4 g 0   No current facility-administered medications on file prior to visit.    There are no Patient Instructions on file for this visit. No follow-ups on file.   Kris Hartmann, NP

## 2021-09-20 NOTE — H&P (View-Only) (Signed)
Subjective:    Patient ID: Joshua [name], male    DOB: 1956/05/26, 66 y.o.   MRN: [id number] Chief Complaint  Patient presents with   Follow-up    Ultrasound follow up    Pius Byrom is a 66 year old male that contacted our office with significant pain in his right calf and cramping in his toes.  He also notes that he has severe claudication-like symptoms.  The patient has had history of multiple endovascular interventions.  He also has femoro tibial bypass graft in his right lower extremity.  He denies any open wounds or ulcerations.  He denies any discoloration of the lower extremity.  The patient notes that he underwent a colonoscopy on 09/09/2021 and had to hold his anticoagulation for several days prior to.  Before that he had been compliant with his medication.  He is currently on Plavix and aspirin.  The patient is also undergoing treatment for his stage III squamous cell lung cancer.  Today noninvasive studies show that his right Pham/anterior tibial artery bypass graft is occluded from the region.   Review of Systems  Cardiovascular:        Claudication and rest pain  All other systems reviewed and are negative.     Objective:   Physical Exam Vitals reviewed.  HENT:     Head: Normocephalic.  Cardiovascular:     Rate and Rhythm: Normal rate.     Pulses:          Dorsalis pedis pulses are 0 on the right side.       Posterior tibial pulses are 0 on the right side.  Pulmonary:     Effort: Pulmonary effort is normal.  Neurological:     Mental Status: He is alert and oriented to person, place, and time.     Motor: Weakness present.     Gait: Gait abnormal.  Psychiatric:        Mood and Affect: Mood normal.        Behavior: Behavior normal.        Thought Content: Thought content normal.        Judgment: Judgment normal.    BP (!) 152/82 (BP Location: Left Arm)    Pulse 81    Resp 16    Wt 207 lb (93.9 kg)    BMI 28.87 kg/m   Past Medical History:   Diagnosis Date   Allergy    Cancer ([place])    lung   Dyspnea    former smoker. only doe   History of kidney stones    Hypertension    Peripheral vascular disease ([place])    Pre-diabetes    Sleep apnea     Social History   Socioeconomic History   Marital status: Single    Spouse name: Not on file   Number of children: Not on file   Years of education: Not on file   Highest education level: Not on file  Occupational History   Occupation: unemployed  Tobacco Use   Smoking status: Former    Packs/day: 2.00    Years: 38.00    Pack years: 76.00    Types: Cigarettes    Quit date: 12/20/2014    Years since quitting: 6.7   Smokeless tobacco: Never   Tobacco comments:    quit smoking in 2017 most smoked 2   Vaping Use   Vaping Use: Former  Substance and Sexual Activity   Alcohol use: Not Currently   Drug use: No  Sexual activity: Yes  Other Topics Concern   Not on file  Social History Narrative   Not on file   Social Determinants of Health   Financial Resource Strain: Not on file  Food Insecurity: Not on file  Transportation Needs: Not on file  Physical Activity: Not on file  Stress: Not on file  Social Connections: Not on file  Intimate Partner Violence: Not on file    Past Surgical History:  Procedure Laterality Date   ANTERIOR CERVICAL DECOMP/DISCECTOMY FUSION N/A 03/14/2020   Procedure: ANTERIOR CERVICAL DECOMPRESSION/DISCECTOMY FUSION 3 LEVELS C4-7;  Surgeon: Meade Maw, MD;  Location: [facility] ORS;  Service: Neurosurgery;  Laterality: N/A;   BACK SURGERY  2011   CENTRAL LINE INSERTION Right 09/10/2016   Procedure: CENTRAL LINE INSERTION;  Surgeon: Katha Cabal, MD;  Location: [facility];  Service: Vascular;  Laterality: Right;   CERVICAL FUSION     C4-c7 on Mar 14, 2020   COLONOSCOPY  2013 ?   COLONOSCOPY N/A 09/09/2021   Procedure: COLONOSCOPY;  Surgeon: [name], DO;  Location: [facility] ENDOSCOPY;  Service: Gastroenterology;  Laterality:  N/A;   CORONARY ANGIOPLASTY     ESOPHAGOGASTRODUODENOSCOPY N/A 09/09/2021   Procedure: ESOPHAGOGASTRODUODENOSCOPY (EGD);  Surgeon: [name], DO;  Location: [facility] ENDOSCOPY;  Service: Gastroenterology;  Laterality: N/A;   FEMORAL-POPLITEAL BYPASS GRAFT Right 09/10/2016   Procedure: BYPASS GRAFT FEMORAL-POPLITEAL ARTERY ( BELOW KNEE );  Surgeon: Katha Cabal, MD;  Location: [facility];  Service: Vascular;  Laterality: Right;   FEMORAL-TIBIAL BYPASS GRAFT Right 03/23/2020   Procedure: BYPASS GRAFT RIGHT FEMORAL- DISTAL TIBIAL ARTERY WITH DISTAL FLOW GRAFT;  Surgeon: Katha Cabal, MD;  Location: [facility];  Service: Vascular;  Laterality: Right;   LEFT HEART CATH AND CORONARY ANGIOGRAPHY Left 05/16/2021   Procedure: LEFT HEART CATH AND CORONARY ANGIOGRAPHY;  Surgeon: Andrez Grime, MD;  Location: [place] [facility];  Service: Cardiovascular;  Laterality: Left;   LOWER EXTREMITY ANGIOGRAPHY Right 11/18/2016   Procedure: Lower Extremity Angiography;  Surgeon: Katha Cabal, MD;  Location: [place] [facility];  Service: Cardiovascular;  Laterality: Right;   LOWER EXTREMITY ANGIOGRAPHY Right 12/09/2016   Procedure: Lower Extremity Angiography;  Surgeon: Katha Cabal, MD;  Location: [place] [facility];  Service: Cardiovascular;  Laterality: Right;   LOWER EXTREMITY ANGIOGRAPHY Right 11/15/2019   Procedure: LOWER EXTREMITY ANGIOGRAPHY;  Surgeon: Katha Cabal, MD;  Location: [place] [facility];  Service: Cardiovascular;  Laterality: Right;   LOWER EXTREMITY ANGIOGRAPHY Right 12/01/2019   Procedure: Lower Extremity Angiography;  Surgeon: Algernon Huxley, MD;  Location: [place] [facility];  Service: Cardiovascular;  Laterality: Right;   LOWER EXTREMITY ANGIOGRAPHY Right 12/02/2019   Procedure: LOWER EXTREMITY ANGIOGRAPHY;  Surgeon: Katha Cabal, MD;  Location: [place] [facility];  Service: Cardiovascular;  Laterality: Right;   LOWER EXTREMITY ANGIOGRAPHY Right  03/23/2020   Procedure: Lower Extremity Angiography;  Surgeon: Katha Cabal, MD;  Location: [place] [facility];  Service: Cardiovascular;  Laterality: Right;   LOWER EXTREMITY INTERVENTION  11/18/2016   Procedure: Lower Extremity Intervention;  Surgeon: Katha Cabal, MD;  Location: [place] [facility];  Service: Cardiovascular;;   PERIPHERAL VASCULAR CATHETERIZATION Right 01/02/2015   Procedure: Lower Extremity Angiography;  Surgeon: Katha Cabal, MD;  Location: [place] [facility];  Service: Cardiovascular;  Laterality: Right;   PERIPHERAL VASCULAR CATHETERIZATION Right 06/18/2015   Procedure: Lower Extremity Angiography;  Surgeon: Algernon Huxley, MD;  Location: [place] [facility];  Service: Cardiovascular;  Laterality: Right;   PERIPHERAL VASCULAR CATHETERIZATION  06/18/2015   Procedure: Lower Extremity Intervention;  Surgeon: Algernon Huxley, MD;  Location: [place] [facility];  Service: Cardiovascular;;   PERIPHERAL VASCULAR CATHETERIZATION N/A 10/09/2015   Procedure: Abdominal Aortogram w/Lower Extremity;  Surgeon: Katha Cabal, MD;  Location: [place] [facility];  Service: Cardiovascular;  Laterality: N/A;   PERIPHERAL VASCULAR CATHETERIZATION  10/09/2015   Procedure: Lower Extremity Intervention;  Surgeon: Katha Cabal, MD;  Location: [place] [facility];  Service: Cardiovascular;;   PERIPHERAL VASCULAR CATHETERIZATION Right 10/10/2015   Procedure: Lower Extremity Angiography;  Surgeon: Algernon Huxley, MD;  Location: [place] [facility];  Service: Cardiovascular;  Laterality: Right;   PERIPHERAL VASCULAR CATHETERIZATION Right 10/10/2015   Procedure: Lower Extremity Intervention;  Surgeon: Algernon Huxley, MD;  Location: [place] [facility];  Service: Cardiovascular;  Laterality: Right;   PERIPHERAL VASCULAR CATHETERIZATION Right 12/03/2015   Procedure: Lower Extremity Angiography;  Surgeon: Algernon Huxley, MD;  Location: [place] [facility];  Service: Cardiovascular;  Laterality:  Right;   PERIPHERAL VASCULAR CATHETERIZATION Right 12/04/2015   Procedure: Lower Extremity Angiography;  Surgeon: Algernon Huxley, MD;  Location: [place] [facility];  Service: Cardiovascular;  Laterality: Right;   PERIPHERAL VASCULAR CATHETERIZATION  12/04/2015   Procedure: Lower Extremity Intervention;  Surgeon: Algernon Huxley, MD;  Location: [place] [facility];  Service: Cardiovascular;;   PERIPHERAL VASCULAR CATHETERIZATION Right 06/30/2016   Procedure: Lower Extremity Angiography;  Surgeon: Algernon Huxley, MD;  Location: [place] [facility];  Service: Cardiovascular;  Laterality: Right;   PERIPHERAL VASCULAR CATHETERIZATION Right 06/25/2016   Procedure: Lower Extremity Angiography;  Surgeon: Katha Cabal, MD;  Location: [place] [facility];  Service: Cardiovascular;  Laterality: Right;   PERIPHERAL VASCULAR CATHETERIZATION Right 07/01/2016   Procedure: Lower Extremity Angiography;  Surgeon: Katha Cabal, MD;  Location: [place] [facility];  Service: Cardiovascular;  Laterality: Right;   PERIPHERAL VASCULAR CATHETERIZATION  07/01/2016   Procedure: Lower Extremity Intervention;  Surgeon: Katha Cabal, MD;  Location: [place] [facility];  Service: Cardiovascular;;   PERIPHERAL VASCULAR CATHETERIZATION Right 08/01/2016   Procedure: Lower Extremity Angiography;  Surgeon: Katha Cabal, MD;  Location: [place] [facility];  Service: Cardiovascular;  Laterality: Right;   PORTA CATH INSERTION N/A 10/05/2020   Procedure: PORTA CATH INSERTION;  Surgeon: Katha Cabal, MD;  Location: [place] [facility];  Service: Cardiovascular;  Laterality: N/A;   stent placement in right leg Right    VIDEO BRONCHOSCOPY N/A 09/14/2020   Procedure: VIDEO BRONCHOSCOPY WITH FLUORO;  Surgeon: Tyler Pita, MD;  Location: [facility];  Service: Cardiopulmonary;  Laterality: N/A;   VIDEO BRONCHOSCOPY WITH ENDOBRONCHIAL ULTRASOUND N/A 09/14/2020   Procedure: VIDEO BRONCHOSCOPY WITH ENDOBRONCHIAL  ULTRASOUND;  Surgeon: Tyler Pita, MD;  Location: [facility];  Service: Cardiopulmonary;  Laterality: N/A;    Family History  Problem Relation Age of Onset   Diabetes Mother    Hypertension Mother    Heart murmur Mother    Leukemia Mother    Throat cancer Maternal Grandmother    Colon cancer Neg Hx    Breast cancer Neg Hx     Allergies  Allergen Reactions   Tape     [place] Dressing tape- burns his skin     CBC Latest Ref Rng & Units 09/13/2021 08/30/2021 08/16/2021  WBC 4.0 - 10.5 K/uL 4.9  5.2 6.3  Hemoglobin 13.0 - 17.0 g/dL 12.3(L) 12.6(L) 13.0  Hematocrit 39.0 - 52.0 % 37.5(L) 38.7(L) 39.8  Platelets 150 - 400 K/uL 157 176 181      CMP     Component Value Date/Time   NA 140 09/13/2021 0847   NA 141 10/14/2019 1101   K 3.6 09/13/2021 0847   CL 106 09/13/2021 0847   CO2 25 09/13/2021 0847   GLUCOSE 108 (H) 09/13/2021 0847   BUN 11 09/13/2021 0847   BUN 15 10/14/2019 1101   BUN 16 07/03/2014 1205   CREATININE 1.11 09/13/2021 0847   CREATININE 1.34 (H) 07/03/2014 1205   CALCIUM 8.8 (L) 09/13/2021 0847   PROT 7.1 09/13/2021 0847   PROT 7.0 04/16/2020 1525   ALBUMIN 4.0 09/13/2021 0847   ALBUMIN 4.3 04/16/2020 1525   AST 28 09/13/2021 0847   ALT 18 09/13/2021 0847   ALKPHOS 65 09/13/2021 0847   BILITOT 0.3 09/13/2021 0847   BILITOT 0.4 04/16/2020 1525   GFRNONAA >60 09/13/2021 0847   GFRNONAA 59 (L) 07/03/2014 1205   GFRAA >60 03/30/2020 0536   GFRAA >60 07/03/2014 1205     No results found.     Assessment & Plan:   1. Atherosclerosis of native artery of right lower extremity with rest pain (HCC) Recommend:  The patient has evidence of severe atherosclerotic changes of the right lower extremities with rest pain that is associated with preulcerative changes and impending tissue loss of the foot.  This represents a limb threatening ischemia and places the patient at the risk for limb loss.  Patient should undergo angiography of the right lower  extremities with the hope for intervention for limb salvage.  The risks and benefits as well as the alternative therapies was discussed in detail with the patient.  All questions were answered.  Patient agrees to proceed with angiography.  The patient will follow up with me in the office after the procedure.       2. Primary hypertension Continue antihypertensive medications as already ordered, these medications have been reviewed and there are no changes at this time.   3. Mixed hyperlipidemia Continue statin as ordered and reviewed, no changes at this time     Current Outpatient Medications on File Prior to Visit  Medication Sig Dispense Refill   acetaminophen (TYLENOL) 325 MG tablet Take 650 mg by mouth every 6 (six) hours as needed for moderate pain.     clopidogrel (PLAVIX) 75 MG tablet TAKE 1 TABLET BY MOUTH  DAILY 90 tablet 3   ELIQUIS 5 MG TABS tablet TAKE 1 TABLET BY MOUTH  TWICE DAILY 120 tablet 5   levothyroxine (SYNTHROID) 88 MCG tablet Take 1 tablet (88 mcg total) by mouth daily before breakfast. 30 tablet 1   lidocaine-prilocaine (EMLA) cream Apply to affected area once (Patient taking differently: Apply 1 application topically daily as needed (port access).) 30 g 3   potassium chloride SA (KLOR-CON M) 20 MEQ tablet Take 1 tablet (20 mEq total) by mouth daily. 30 tablet 1   simvastatin (ZOCOR) 40 MG tablet Take 40 mg by mouth at bedtime.     Tiotropium Bromide-Olodaterol (STIOLTO RESPIMAT) 2.5-2.5 MCG/ACT AERS Inhale 2 puffs into the lungs daily. (Patient not taking: Reported on 09/13/2021) 4 g 0   No current facility-administered medications on file prior to visit.    There are no Patient Instructions on file for this visit. No follow-ups on file.   Kris Hartmann, NP

## 2021-09-20 NOTE — Telephone Encounter (Signed)
Spoke with the patient and he is scheduled with Dr. Delana Meyer for a RLE angio on 09/25/21 with a 11:00 am arrival time to the [facility]. Pre-procedure instructions were discussed and will be mailed. ?

## 2021-09-23 ENCOUNTER — Other Ambulatory Visit: Payer: Self-pay | Admitting: Oncology

## 2021-09-24 ENCOUNTER — Other Ambulatory Visit: Payer: Self-pay | Admitting: Neurosurgery

## 2021-09-24 ENCOUNTER — Encounter: Payer: Self-pay | Admitting: Oncology

## 2021-09-24 DIAGNOSIS — M542 Cervicalgia: Secondary | ICD-10-CM

## 2021-09-24 DIAGNOSIS — G959 Disease of spinal cord, unspecified: Secondary | ICD-10-CM

## 2021-09-24 DIAGNOSIS — Z981 Arthrodesis status: Secondary | ICD-10-CM

## 2021-09-24 NOTE — Telephone Encounter (Signed)
?  Component Ref Range & Units 11 d ago ?(09/13/21) 3 wk ago ?(08/30/21) 1 mo ago ?(08/16/21) 1 mo ago ?(08/02/21) 2 mo ago ?(07/04/21) 3 mo ago ?(06/20/21) 3 mo ago ?(05/30/21)  ?Potassium 3.5 - 5.1 mmol/L 3.6  3.7  4.0  3.8  3.9  3.2 Low   3.7   ?  ? ?

## 2021-09-25 ENCOUNTER — Other Ambulatory Visit (INDEPENDENT_AMBULATORY_CARE_PROVIDER_SITE_OTHER): Payer: Self-pay | Admitting: Nurse Practitioner

## 2021-09-25 ENCOUNTER — Other Ambulatory Visit: Payer: Self-pay

## 2021-09-25 ENCOUNTER — Inpatient Hospital Stay
Admission: AD | Admit: 2021-09-25 | Discharge: 2021-09-27 | DRG: 253 | Disposition: A | Discharge status: Home / Self Care | Payer: Medicare Other | Attending: Vascular Surgery | Admitting: Vascular Surgery

## 2021-09-25 ENCOUNTER — Encounter: Payer: Self-pay | Admitting: Vascular Surgery

## 2021-09-25 ENCOUNTER — Encounter
Admission: AD | Disposition: A | Discharge status: Home / Self Care | Payer: Self-pay | Source: Home / Self Care | Attending: Vascular Surgery

## 2021-09-25 DIAGNOSIS — Z20822 Contact with and (suspected) exposure to covid-19: Secondary | ICD-10-CM | POA: Diagnosis present

## 2021-09-25 DIAGNOSIS — E782 Mixed hyperlipidemia: Secondary | ICD-10-CM | POA: Diagnosis present

## 2021-09-25 DIAGNOSIS — Z981 Arthrodesis status: Secondary | ICD-10-CM

## 2021-09-25 DIAGNOSIS — Z7901 Long term (current) use of anticoagulants: Secondary | ICD-10-CM | POA: Diagnosis not present

## 2021-09-25 DIAGNOSIS — Z833 Family history of diabetes mellitus: Secondary | ICD-10-CM

## 2021-09-25 DIAGNOSIS — Z7902 Long term (current) use of antithrombotics/antiplatelets: Secondary | ICD-10-CM | POA: Diagnosis not present

## 2021-09-25 DIAGNOSIS — Z87891 Personal history of nicotine dependence: Secondary | ICD-10-CM

## 2021-09-25 DIAGNOSIS — Y832 Surgical operation with anastomosis, bypass or graft as the cause of abnormal reaction of the patient, or of later complication, without mention of misadventure at the time of the procedure: Secondary | ICD-10-CM | POA: Diagnosis present

## 2021-09-25 DIAGNOSIS — G4733 Obstructive sleep apnea (adult) (pediatric): Secondary | ICD-10-CM | POA: Diagnosis present

## 2021-09-25 DIAGNOSIS — T82868A Thrombosis of vascular prosthetic devices, implants and grafts, initial encounter: Principal | ICD-10-CM | POA: Diagnosis present

## 2021-09-25 DIAGNOSIS — I10 Essential (primary) hypertension: Secondary | ICD-10-CM | POA: Diagnosis present

## 2021-09-25 DIAGNOSIS — J449 Chronic obstructive pulmonary disease, unspecified: Secondary | ICD-10-CM | POA: Diagnosis present

## 2021-09-25 DIAGNOSIS — I70229 Atherosclerosis of native arteries of extremities with rest pain, unspecified extremity: Secondary | ICD-10-CM

## 2021-09-25 DIAGNOSIS — Z79899 Other long term (current) drug therapy: Secondary | ICD-10-CM | POA: Diagnosis not present

## 2021-09-25 DIAGNOSIS — I70221 Atherosclerosis of native arteries of extremities with rest pain, right leg: Secondary | ICD-10-CM

## 2021-09-25 DIAGNOSIS — R7303 Prediabetes: Secondary | ICD-10-CM | POA: Diagnosis present

## 2021-09-25 DIAGNOSIS — Z8249 Family history of ischemic heart disease and other diseases of the circulatory system: Secondary | ICD-10-CM

## 2021-09-25 DIAGNOSIS — Z9221 Personal history of antineoplastic chemotherapy: Secondary | ICD-10-CM | POA: Diagnosis not present

## 2021-09-25 DIAGNOSIS — Z91048 Other nonmedicinal substance allergy status: Secondary | ICD-10-CM | POA: Diagnosis not present

## 2021-09-25 DIAGNOSIS — T82818A Embolism of vascular prosthetic devices, implants and grafts, initial encounter: Secondary | ICD-10-CM | POA: Diagnosis present

## 2021-09-25 DIAGNOSIS — Z79890 Hormone replacement therapy (postmenopausal): Secondary | ICD-10-CM

## 2021-09-25 DIAGNOSIS — E039 Hypothyroidism, unspecified: Secondary | ICD-10-CM | POA: Diagnosis present

## 2021-09-25 DIAGNOSIS — Z923 Personal history of irradiation: Secondary | ICD-10-CM

## 2021-09-25 DIAGNOSIS — C3491 Malignant neoplasm of unspecified part of right bronchus or lung: Secondary | ICD-10-CM | POA: Diagnosis present

## 2021-09-25 DIAGNOSIS — I998 Other disorder of circulatory system: Secondary | ICD-10-CM

## 2021-09-25 HISTORY — PX: LOWER EXTREMITY ANGIOGRAPHY: CATH118251

## 2021-09-25 LAB — MRSA NEXT GEN BY PCR, NASAL: MRSA by PCR Next Gen: NOT DETECTED

## 2021-09-25 LAB — HEPARIN LEVEL (UNFRACTIONATED)
Heparin Unfractionated: 0.37 IU/mL (ref 0.30–0.70)
Heparin Unfractionated: 0.62 IU/mL (ref 0.30–0.70)

## 2021-09-25 LAB — CBC
HCT: 34.9 % — ABNORMAL LOW (ref 39.0–52.0)
Hemoglobin: 11.3 g/dL — ABNORMAL LOW (ref 13.0–17.0)
MCH: 28.3 pg (ref 26.0–34.0)
MCHC: 32.4 g/dL (ref 30.0–36.0)
MCV: 87.3 fL (ref 80.0–100.0)
Platelets: 147 10*3/uL — ABNORMAL LOW (ref 150–400)
RBC: 4 MIL/uL — ABNORMAL LOW (ref 4.22–5.81)
RDW: 13.8 % (ref 11.5–15.5)
WBC: 4.6 10*3/uL (ref 4.0–10.5)
nRBC: 0 % (ref 0.0–0.2)

## 2021-09-25 LAB — FIBRINOGEN
Fibrinogen: 193 mg/dL — ABNORMAL LOW (ref 210–475)
Fibrinogen: 303 mg/dL (ref 210–475)

## 2021-09-25 LAB — GLUCOSE, CAPILLARY: Glucose-Capillary: 74 mg/dL (ref 70–99)

## 2021-09-25 LAB — CREATININE, SERUM
Creatinine, Ser: 1.23 mg/dL (ref 0.61–1.24)
GFR, Estimated: >60 mL/min (ref >60)

## 2021-09-25 LAB — BUN: BUN: 21 mg/dL (ref 8–23)

## 2021-09-25 SURGERY — LOWER EXTREMITY ANGIOGRAPHY
Anesthesia: Moderate Sedation | Site: Leg Lower | Laterality: Right

## 2021-09-25 MED ORDER — FENTANYL CITRATE PF 50 MCG/ML IJ SOSY
PREFILLED_SYRINGE | INTRAMUSCULAR | Status: AC
  Filled 2021-09-25: qty 1

## 2021-09-25 MED ORDER — SODIUM CHLORIDE 0.9 % IV SOLN: 250.0000 mL | INTRAVENOUS | Status: DC | PRN

## 2021-09-25 MED ORDER — LEVOTHYROXINE SODIUM 88 MCG PO TABS
88.0000 ug | ORAL_TABLET | Freq: Every day | ORAL | Status: DC
  Filled 2021-09-25 (×2): qty 1

## 2021-09-25 MED ORDER — MIDAZOLAM HCL 2 MG/ML PO SYRP: 8.0000 mg | ORAL_SOLUTION | Freq: Once | ORAL | Status: DC | PRN

## 2021-09-25 MED ORDER — HEPARIN (PORCINE) 25000 UT/250ML-% IV SOLN: 400.0000 [IU]/h | INTRAVENOUS | Status: DC

## 2021-09-25 MED ORDER — ALTEPLASE 1 MG/ML SYRINGE FOR VASCULAR PROCEDURE
INTRAMUSCULAR | Status: DC | PRN
Start: 2021-09-25 — End: 2021-09-25
  Administered 2021-09-25: 6 mg via INTRA_ARTERIAL

## 2021-09-25 MED ORDER — METHYLPREDNISOLONE SODIUM SUCC 125 MG IJ SOLR
125.0000 mg | Freq: Once | INTRAMUSCULAR | Status: DC | PRN
Start: 2021-09-25 — End: 2021-09-25

## 2021-09-25 MED ORDER — CEFAZOLIN SODIUM-DEXTROSE 2-4 GM/100ML-% IV SOLN
2.0000 g | Freq: Once | INTRAVENOUS | Status: AC
  Administered 2021-09-25: 2 g via INTRAVENOUS

## 2021-09-25 MED ORDER — UMECLIDINIUM BROMIDE 62.5 MCG/ACT IN AEPB
1.0000 | INHALATION_SPRAY | Freq: Every day | RESPIRATORY_TRACT | Status: DC
  Filled 2021-09-25: qty 7

## 2021-09-25 MED ORDER — HYDRALAZINE HCL 20 MG/ML IJ SOLN: 5.0000 mg | INTRAMUSCULAR | Status: DC | PRN

## 2021-09-25 MED ORDER — ARFORMOTEROL TARTRATE 15 MCG/2ML IN NEBU
15.0000 ug | INHALATION_SOLUTION | Freq: Two times a day (BID) | RESPIRATORY_TRACT | Status: DC
  Administered 2021-09-26 – 2021-09-27 (×2): 15 ug via RESPIRATORY_TRACT
  Filled 2021-09-25 (×5): qty 2

## 2021-09-25 MED ORDER — FENTANYL CITRATE (PF) 100 MCG/2ML IJ SOLN
INTRAMUSCULAR | Status: DC | PRN
  Administered 2021-09-25 (×2): 50 ug via INTRAVENOUS

## 2021-09-25 MED ORDER — SODIUM CHLORIDE 0.9 % IV SOLN: INTRAVENOUS | Status: DC

## 2021-09-25 MED ORDER — ONDANSETRON HCL 4 MG/2ML IJ SOLN: 4.0000 mg | Freq: Four times a day (QID) | INTRAMUSCULAR | Status: DC | PRN

## 2021-09-25 MED ORDER — CHLORHEXIDINE GLUCONATE CLOTH 2 % EX PADS
6.0000 | MEDICATED_PAD | Freq: Every day | CUTANEOUS | Status: DC
  Administered 2021-09-25 – 2021-09-27 (×3): 6 via TOPICAL

## 2021-09-25 MED ORDER — MIDAZOLAM HCL 5 MG/5ML IJ SOLN
INTRAMUSCULAR | Status: AC
  Filled 2021-09-25: qty 5

## 2021-09-25 MED ORDER — DIPHENHYDRAMINE HCL 50 MG/ML IJ SOLN: 50.0000 mg | Freq: Once | INTRAMUSCULAR | Status: DC | PRN

## 2021-09-25 MED ORDER — HEPARIN SODIUM (PORCINE) 1000 UNIT/ML IJ SOLN
INTRAMUSCULAR | Status: AC
  Filled 2021-09-25: qty 10

## 2021-09-25 MED ORDER — ALTEPLASE 2 MG IJ SOLR
0.5000 mg/h | INTRAMUSCULAR | Status: DC
  Administered 2021-09-25: 0.5 mg/h
  Filled 2021-09-25 (×2): qty 10

## 2021-09-25 MED ORDER — ALTEPLASE 2 MG IJ SOLR
INTRAMUSCULAR | Status: AC
  Filled 2021-09-25: qty 6

## 2021-09-25 MED ORDER — SODIUM CHLORIDE 0.9 % IV SOLN
1.0000 mg/h | INTRAVENOUS | Status: AC
  Administered 2021-09-25: 1 mg/h
  Filled 2021-09-25: qty 10

## 2021-09-25 MED ORDER — SODIUM CHLORIDE 0.9% FLUSH: 3.0000 mL | INTRAVENOUS | Status: DC | PRN

## 2021-09-25 MED ORDER — MORPHINE SULFATE (PF) 4 MG/ML IV SOLN: 5.0000 mg | INTRAVENOUS | Status: DC | PRN

## 2021-09-25 MED ORDER — MIDAZOLAM HCL 2 MG/2ML IJ SOLN: 1.0000 mg | INTRAMUSCULAR | Status: DC | PRN

## 2021-09-25 MED ORDER — LABETALOL HCL 5 MG/ML IV SOLN: 10.0000 mg | INTRAVENOUS | Status: DC | PRN

## 2021-09-25 MED ORDER — METOPROLOL TARTRATE 5 MG/5ML IV SOLN
5.0000 mg | Freq: Four times a day (QID) | INTRAVENOUS | Status: DC
  Administered 2021-09-25 – 2021-09-26 (×3): 5 mg via INTRAVENOUS
  Filled 2021-09-25 (×3): qty 5

## 2021-09-25 MED ORDER — SODIUM CHLORIDE 0.9% FLUSH
3.0000 mL | Freq: Two times a day (BID) | INTRAVENOUS | Status: DC
  Administered 2021-09-26: 22:00:00 3 mL via INTRAVENOUS

## 2021-09-25 MED ORDER — HEPARIN (PORCINE) 25000 UT/250ML-% IV SOLN
INTRAVENOUS | Status: AC
  Administered 2021-09-25: 400 [IU]/h via INTRA_ARTERIAL
  Filled 2021-09-25: qty 250

## 2021-09-25 MED ORDER — MIDAZOLAM HCL 2 MG/2ML IJ SOLN
INTRAMUSCULAR | Status: DC | PRN
Start: 2021-09-25 — End: 2021-09-25
  Administered 2021-09-25: 2 mg via INTRAVENOUS
  Administered 2021-09-25: 1 mg via INTRAVENOUS

## 2021-09-25 MED ORDER — HYDROMORPHONE HCL 1 MG/ML IJ SOLN: 1.0000 mg | Freq: Once | INTRAMUSCULAR | Status: DC | PRN

## 2021-09-25 MED ORDER — FAMOTIDINE 20 MG PO TABS: 40.0000 mg | ORAL_TABLET | Freq: Once | ORAL | Status: DC | PRN

## 2021-09-25 MED ORDER — HEPARIN SODIUM (PORCINE) 1000 UNIT/ML IJ SOLN
INTRAMUSCULAR | Status: DC | PRN
Start: 2021-09-25 — End: 2021-09-25
  Administered 2021-09-25: 5000 [IU] via INTRAVENOUS

## 2021-09-25 MED ORDER — HEPARIN (PORCINE) 25000 UT/250ML-% IV SOLN
400.0000 [IU]/h | INTRAVENOUS | Status: DC
Start: 2021-09-25 — End: 2021-09-25

## 2021-09-25 MED ORDER — FENTANYL CITRATE PF 50 MCG/ML IJ SOSY
PREFILLED_SYRINGE | INTRAMUSCULAR | Status: AC
Start: 2021-09-25 — End: 2021-09-26
  Filled 2021-09-25: qty 2

## 2021-09-25 SURGICAL SUPPLY — 25 items
BALLN ULTRASCORE 5X40X130 (BALLOONS) ×2
BALLN ULTRSCOR 014 2.5X200X150 (BALLOONS) ×2
BALLOON ULTRASCORE 5X40X130 (BALLOONS) IMPLANT
BALLOON ULTRSC 014 2.5X200X150 (BALLOONS) IMPLANT
CANNULA 5F STIFF (CANNULA) ×1 IMPLANT
CATH ANGIO 5F PIGTAIL 65CM (CATHETERS) ×1 IMPLANT
CATH INFUS 135CMX50CM (CATHETERS) ×1 IMPLANT
CATH NAVICROSS ANGLED 135CM (MICROCATHETER) ×1 IMPLANT
CATH VERT 5X100 (CATHETERS) ×1 IMPLANT
COVER PROBE U/S 5X48 (MISCELLANEOUS) ×1 IMPLANT
GLIDEWIRE ADV .035X260CM (WIRE) ×1 IMPLANT
KIT CV MULTILUMEN 7FR 20 (SET/KITS/TRAYS/PACK) ×2
KIT CV MULTILUMEN 7FR 20 SUB (SET/KITS/TRAYS/PACK) IMPLANT
KIT ENCORE 26 ADVANTAGE (KITS) ×1 IMPLANT
NDL ENTRY 21GA 7CM ECHOTIP (NEEDLE) IMPLANT
NEEDLE ENTRY 21GA 7CM ECHOTIP (NEEDLE) ×2 IMPLANT
PACK ANGIOGRAPHY (CUSTOM PROCEDURE TRAY) ×2 IMPLANT
SET INTRO CAPELLA COAXIAL (SET/KITS/TRAYS/PACK) ×1 IMPLANT
SHEATH BRITE TIP 5FRX11 (SHEATH) ×1 IMPLANT
SHEATH PINNACLE ST 6F 45CM (SHEATH) ×1 IMPLANT
SUT SILK 0 FSL (SUTURE) ×1 IMPLANT
SYR MEDRAD MARK 7 150ML (SYRINGE) ×1 IMPLANT
TUBING CONTRAST HIGH PRESS 72 (TUBING) ×1 IMPLANT
WIRE GUIDERIGHT .035X150 (WIRE) ×1 IMPLANT
WIRE RUNTHROUGH .014X300CM (WIRE) ×1 IMPLANT

## 2021-09-25 NOTE — Consult Note (Signed)
NAME:  Joshua [name], MRN:  [id number], DOB:  April 21, 1956, LOS: 0 ADMISSION DATE:  09/25/2021, CONSULTATION DATE:  09/25/21 REFERRING MD:  Dr Delana Meyer, CHIEF COMPLAINT:  Atherosclerosis of right lower extremity with rest pain   Brief Pt Description / Synopsis:  66 y.o. male with Atherosclerosis of the RLE with rest pain who underwent elective RLE angiogram on 3/8.  Requires continuous tPA infusion overnight with plan for repeat Angiogram on 3/9.  History of Present Illness:  66 year old male with significant past medical history of PVD, squamous cell carcinoma of the right lung, mediastinal adenopathy with hemoptysis, hypothyroidism, prediabetes, OSA, and hypertension who presented to the hospital for elective procedure.  Patient is followed by vascular Dr. Delana Meyer for severe atherosclerotic changes of the right lower extremity with rest pain that is associated with preulcerative changes.  Due to limb threatening ischemia patient was scheduled to undergo angiogram of the right lower extremity with the hope for limb salvage.  Patient underwent angiogram of the right lower extremity on 3/8 and was transferred to the ICU.  PCCM consulted to assist with monitoring for complications post op in the ICU. Na+/ K+:  Glucose: 74 BUN/Cr.:  21/1.23   WBC/ TMAX: 4.6/ afebrile Hgb/Hct: 11.3/34.9 Plts: 147 COVID PCR: Pending  Pertinent  Medical History  Peripheral vascular disease Hypertension Lung cancer Obstructive sleep apnea Prediabetes  Consults:  PCCM  Procedures:  3/8: Lower Extremity Angiography (Right) as a surgical intervention  Significant Diagnostic Tests:  None  Micro Data:  3/8: SARS-CoV-2 PCR>  3/8: Influenza PCR>   Antimicrobials:  Cefazolin 3/8 x 2 doses  OBJECTIVE  Blood pressure 135/83, pulse 71, temperature 97.6 F (36.4 C), temperature source Oral, resp. rate 16, height 5\' 11"  (1.803 m), weight 91.3 kg, SpO2 99 %.        Intake/Output Summary (Last 24 hours)  at 09/25/2021 2004 Last data filed at 09/25/2021 1600 Gross per 24 hour  Intake --  Output 350 ml  Net -350 ml   Filed Weights   09/25/21 1755  Weight: 91.3 kg   Physical Examination  GENERAL: 66 year-old critically ill patient lying in the bed with no acute distress.  EYES: Pupils equal, round, reactive to light and accommodation. No scleral icterus. Extraocular muscles intact.  HEENT: Head atraumatic, normocephalic. Oropharynx and nasopharynx clear.  NECK:  Supple, no jugular venous distention. No thyroid enlargement, no tenderness.  LUNGS: Normal breath sounds bilaterally, no wheezing, rales,rhonchi or crepitation. No use of accessory muscles of respiration.  CARDIOVASCULAR: S1, S2 normal. No murmurs, rubs, or gallops.  ABDOMEN: Soft, nontender, nondistended. Bowel sounds present. No organomegaly or mass.  EXTREMITIES: No pedal edema, cyanosis, or clubbing.  Left groin site without hematoma or significant erythema. NEUROLOGIC: Cranial nerves II through XII are intact.  Muscle strength 5/5 in all extremities. Sensation decreased in the lower extremities. Gait not checked.  PSYCHIATRIC: The patient is alert and oriented x 3.  SKIN: No obvious rash, lesion, or ulcer.   Labs/imaging that I havepersonally reviewed  (right click and "Reselect all SmartList Selections" daily)     Labs   CBC: Recent Labs  Lab 09/25/21 1840  WBC 4.6  HGB 11.3*  HCT 34.9*  MCV 87.3  PLT 147*    Basic Metabolic Panel: Recent Labs  Lab 09/25/21 1134  BUN 21  CREATININE 1.23   GFR: Estimated Creatinine Clearance: 71 mL/min (by C-G formula based on SCr of 1.23 mg/dL). Recent Labs  Lab 09/25/21 1840  WBC 4.6  Liver Function Tests: No results for input(s): AST, ALT, ALKPHOS, BILITOT, PROT, ALBUMIN in the last 168 hours. No results for input(s): LIPASE, AMYLASE in the last 168 hours. No results for input(s): AMMONIA in the last 168 hours.  ABG No results found for: PHART, PCO2ART, PO2ART,  HCO3, TCO2, ACIDBASEDEF, O2SAT   Coagulation Profile: No results for input(s): INR, PROTIME in the last 168 hours.  Cardiac Enzymes: No results for input(s): CKTOTAL, CKMB, CKMBINDEX, TROPONINI in the last 168 hours.  HbA1C: Hgb A1c MFr Bld  Date/Time Value Ref Range Status  11/21/2020 11:50 AM 5.8 (H) 4.8 - 5.6 % Final    Comment:    (NOTE) Pre diabetes:          5.7%-6.4%  Diabetes:              >6.4%  Glycemic control for   <7.0% adults with diabetes   04/16/2020 03:25 PM 5.5 4.8 - 5.6 % Final    Comment:             Prediabetes: 5.7 - 6.4          Diabetes: >6.4          Glycemic control for adults with diabetes: <7.0     CBG: Recent Labs  Lab 09/25/21 1751  GLUCAP 74    Past Medical History  He,  has a past medical history of Allergy, Cancer ([place]), Dyspnea, History of kidney stones, Hypertension, Peripheral vascular disease ([place]), Pre-diabetes, and Sleep apnea.   Surgical History    Past Surgical History:  Procedure Laterality Date   ANTERIOR CERVICAL DECOMP/DISCECTOMY FUSION N/A 03/14/2020   Procedure: ANTERIOR CERVICAL DECOMPRESSION/DISCECTOMY FUSION 3 LEVELS C4-7;  Surgeon: Meade Maw, MD;  Location: [facility] ORS;  Service: Neurosurgery;  Laterality: N/A;   BACK SURGERY  2011   CENTRAL LINE INSERTION Right 09/10/2016   Procedure: CENTRAL LINE INSERTION;  Surgeon: Katha Cabal, MD;  Location: [facility];  Service: Vascular;  Laterality: Right;   CERVICAL FUSION     C4-c7 on Mar 14, 2020   COLONOSCOPY  2013 ?   COLONOSCOPY N/A 09/09/2021   Procedure: COLONOSCOPY;  Surgeon: [name], DO;  Location: [facility];  Service: Gastroenterology;  Laterality: N/A;   CORONARY ANGIOPLASTY     ESOPHAGOGASTRODUODENOSCOPY N/A 09/09/2021   Procedure: ESOPHAGOGASTRODUODENOSCOPY (EGD);  Surgeon: [name], DO;  Location: [facility] ENDOSCOPY;  Service: Gastroenterology;  Laterality: N/A;   FEMORAL-POPLITEAL BYPASS GRAFT Right 09/10/2016    Procedure: BYPASS GRAFT FEMORAL-POPLITEAL ARTERY ( BELOW KNEE );  Surgeon: Katha Cabal, MD;  Location: [facility];  Service: Vascular;  Laterality: Right;   FEMORAL-TIBIAL BYPASS GRAFT Right 03/23/2020   Procedure: BYPASS GRAFT RIGHT FEMORAL- DISTAL TIBIAL ARTERY WITH DISTAL FLOW GRAFT;  Surgeon: Katha Cabal, MD;  Location: [facility];  Service: Vascular;  Laterality: Right;   LEFT HEART CATH AND CORONARY ANGIOGRAPHY Left 05/16/2021   Procedure: LEFT HEART CATH AND CORONARY ANGIOGRAPHY;  Surgeon: Andrez Grime, MD;  Location: [place] [facility];  Service: Cardiovascular;  Laterality: Left;   LOWER EXTREMITY ANGIOGRAPHY Right 11/18/2016   Procedure: Lower Extremity Angiography;  Surgeon: Katha Cabal, MD;  Location: [place] [facility];  Service: Cardiovascular;  Laterality: Right;   LOWER EXTREMITY ANGIOGRAPHY Right 12/09/2016   Procedure: Lower Extremity Angiography;  Surgeon: Katha Cabal, MD;  Location: [place] [facility];  Service: Cardiovascular;  Laterality: Right;   LOWER EXTREMITY ANGIOGRAPHY Right 11/15/2019   Procedure: LOWER EXTREMITY ANGIOGRAPHY;  Surgeon: Katha Cabal, MD;  Location: [place] [facility];  Service: Cardiovascular;  Laterality: Right;   LOWER EXTREMITY ANGIOGRAPHY Right 12/01/2019   Procedure: Lower Extremity Angiography;  Surgeon: Algernon Huxley, MD;  Location: [place] [facility];  Service: Cardiovascular;  Laterality: Right;   LOWER EXTREMITY ANGIOGRAPHY Right 12/02/2019   Procedure: LOWER EXTREMITY ANGIOGRAPHY;  Surgeon: Katha Cabal, MD;  Location: [place] [facility];  Service: Cardiovascular;  Laterality: Right;   LOWER EXTREMITY ANGIOGRAPHY Right 03/23/2020   Procedure: Lower Extremity Angiography;  Surgeon: Katha Cabal, MD;  Location: [place] [facility];  Service: Cardiovascular;  Laterality: Right;   LOWER EXTREMITY INTERVENTION  11/18/2016   Procedure: Lower Extremity Intervention;  Surgeon: Katha Cabal, MD;   Location: [place] [facility];  Service: Cardiovascular;;   PERIPHERAL VASCULAR CATHETERIZATION Right 01/02/2015   Procedure: Lower Extremity Angiography;  Surgeon: Katha Cabal, MD;  Location: [place] [facility];  Service: Cardiovascular;  Laterality: Right;   PERIPHERAL VASCULAR CATHETERIZATION Right 06/18/2015   Procedure: Lower Extremity Angiography;  Surgeon: Algernon Huxley, MD;  Location: [place] [facility];  Service: Cardiovascular;  Laterality: Right;   PERIPHERAL VASCULAR CATHETERIZATION  06/18/2015   Procedure: Lower Extremity Intervention;  Surgeon: Algernon Huxley, MD;  Location: [place] [facility];  Service: Cardiovascular;;   PERIPHERAL VASCULAR CATHETERIZATION N/A 10/09/2015   Procedure: Abdominal Aortogram w/Lower Extremity;  Surgeon: Katha Cabal, MD;  Location: [place] [facility];  Service: Cardiovascular;  Laterality: N/A;   PERIPHERAL VASCULAR CATHETERIZATION  10/09/2015   Procedure: Lower Extremity Intervention;  Surgeon: Katha Cabal, MD;  Location: [place] [facility];  Service: Cardiovascular;;   PERIPHERAL VASCULAR CATHETERIZATION Right 10/10/2015   Procedure: Lower Extremity Angiography;  Surgeon: Algernon Huxley, MD;  Location: [place] [facility];  Service: Cardiovascular;  Laterality: Right;   PERIPHERAL VASCULAR CATHETERIZATION Right 10/10/2015   Procedure: Lower Extremity Intervention;  Surgeon: Algernon Huxley, MD;  Location: [place] [facility];  Service: Cardiovascular;  Laterality: Right;   PERIPHERAL VASCULAR CATHETERIZATION Right 12/03/2015   Procedure: Lower Extremity Angiography;  Surgeon: Algernon Huxley, MD;  Location: [place] [facility];  Service: Cardiovascular;  Laterality: Right;   PERIPHERAL VASCULAR CATHETERIZATION Right 12/04/2015   Procedure: Lower Extremity Angiography;  Surgeon: Algernon Huxley, MD;  Location: [place] [facility];  Service: Cardiovascular;  Laterality: Right;   PERIPHERAL VASCULAR CATHETERIZATION  12/04/2015   Procedure: Lower  Extremity Intervention;  Surgeon: Algernon Huxley, MD;  Location: [place] [facility];  Service: Cardiovascular;;   PERIPHERAL VASCULAR CATHETERIZATION Right 06/30/2016   Procedure: Lower Extremity Angiography;  Surgeon: Algernon Huxley, MD;  Location: [place] [facility];  Service: Cardiovascular;  Laterality: Right;   PERIPHERAL VASCULAR CATHETERIZATION Right 06/25/2016   Procedure: Lower Extremity Angiography;  Surgeon: Katha Cabal, MD;  Location: [place] [facility];  Service: Cardiovascular;  Laterality: Right;   PERIPHERAL VASCULAR CATHETERIZATION Right 07/01/2016   Procedure: Lower Extremity Angiography;  Surgeon: Katha Cabal, MD;  Location: [place] [facility];  Service: Cardiovascular;  Laterality: Right;   PERIPHERAL VASCULAR CATHETERIZATION  07/01/2016   Procedure: Lower Extremity Intervention;  Surgeon: Katha Cabal, MD;  Location: [place] [facility];  Service: Cardiovascular;;   PERIPHERAL VASCULAR CATHETERIZATION Right 08/01/2016   Procedure: Lower Extremity Angiography;  Surgeon: Katha Cabal, MD;  Location: [place] [facility];  Service: Cardiovascular;  Laterality: Right;   PORTA CATH INSERTION N/A  10/05/2020   Procedure: PORTA CATH INSERTION;  Surgeon: Katha Cabal, MD;  Location: [place] [facility];  Service: Cardiovascular;  Laterality: N/A;   stent placement in right leg Right    VIDEO BRONCHOSCOPY N/A 09/14/2020   Procedure: VIDEO BRONCHOSCOPY WITH FLUORO;  Surgeon: Tyler Pita, MD;  Location: [facility];  Service: Cardiopulmonary;  Laterality: N/A;   VIDEO BRONCHOSCOPY WITH ENDOBRONCHIAL ULTRASOUND N/A 09/14/2020   Procedure: VIDEO BRONCHOSCOPY WITH ENDOBRONCHIAL ULTRASOUND;  Surgeon: Tyler Pita, MD;  Location: [facility];  Service: Cardiopulmonary;  Laterality: N/A;     Social History   reports that he quit smoking about 6 years ago. His smoking use included cigarettes. He has a 76.00 pack-year smoking history. He has never used  smokeless tobacco. He reports that he does not currently use alcohol. He reports that he does not use drugs.   Family History   His family history includes Diabetes in his mother; Heart murmur in his mother; Hypertension in his mother; Leukemia in his mother; Throat cancer in his maternal grandmother. There is no history of Colon cancer or Breast cancer.   Allergies Allergies  Allergen Reactions   Tape     Honeycomb Dressing tape- burns his skin      Home Medications  Prior to Admission medications   Medication Sig Start Date End Date Taking? Authorizing Provider  acetaminophen (TYLENOL) 325 MG tablet Take 650 mg by mouth every 6 (six) hours as needed for moderate pain.   Yes [provider]  clopidogrel (PLAVIX) 75 MG tablet TAKE 1 TABLET BY MOUTH  DAILY 03/01/21  Yes Schnier, Dolores Lory, MD  ELIQUIS 5 MG TABS tablet TAKE 1 TABLET BY MOUTH  TWICE DAILY 08/12/21  Yes Kris Hartmann, NP  KLOR-CON M20 20 MEQ tablet Take 1 tablet by mouth once daily 09/24/21  Yes [name], MD  levothyroxine (SYNTHROID) 88 MCG tablet Take 1 tablet (88 mcg total) by mouth daily before breakfast. 09/13/21  Yes [name], MD  lidocaine-prilocaine (EMLA) cream Apply to affected area once Patient taking differently: Apply 1 application topically daily as needed (port access). 10/01/20  Yes [name], MD  simvastatin (ZOCOR) 40 MG tablet Take 40 mg by mouth at bedtime. 02/28/21  Yes [provider]  Tiotropium Bromide-Olodaterol (STIOLTO RESPIMAT) 2.5-2.5 MCG/ACT AERS Inhale 2 puffs into the lungs daily. 09/04/21  Yes Tyler Pita, MD  Scheduled Meds:  arformoterol  15 mcg Nebulization BID   And   umeclidinium bromide  1 puff Inhalation Daily   [START ON 09/26/2021] Chlorhexidine Gluconate Cloth  6 each Topical Q0600   fentaNYL       heparin sodium (porcine)       [START ON 09/26/2021] levothyroxine  88 mcg Oral QAC breakfast   metoprolol tartrate  5 mg Intravenous Q6H   midazolam       sodium  chloride flush  3 mL Intravenous Q12H   Continuous Infusions:  sodium chloride     alteplase (LIMB ISCHEMIA) 10 mg in normal saline (0.02 mg/mL) infusion 0.5 mg/hr (09/25/21 2011)   heparin     PRN Meds:.sodium chloride, hydrALAZINE, HYDROmorphone (DILAUDID) injection, labetalol, midazolam, morphine injection, ondansetron (ZOFRAN) IV, ondansetron (ZOFRAN) IV, sodium chloride flush  Active Hospital Problem list     Assessment & Plan:  RLE Atherosclerosis  with rest pain S/p elective RLE angiogram on 3/8. 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 -As needed labetalol and hydralazine  Pre-Diabetes  -CBGs -Sliding scale insulin -Follow ICU hyper/hypoglycemia protocol  Hypothyroidism -Continue Synthroid  Squamous cell carcinoma of right lung, stage IIIA WN:IOEV, OSA, former smoker, Radiation pneumonititis -Supplemental O2 as needed to maintain O2 saturations 88 to 92% -s/p radiation and chemo per oncology -Follows with Dr. Tasia Catchings -continue bronchodilators, follows with Dr. Patsey Berthold pulmonologist   Best practice:  Diet:  NPO Pain/Anxiety/Delirium protocol (if indicated): No VAP protocol (if indicated): Not indicated DVT prophylaxis: Systemic AC GI prophylaxis: N/A Glucose control:  SSI Yes Central venous access:  N/A Arterial line:  N/A Foley:  N/A Mobility:  bed rest  PT consulted: N/A Last date of multidisciplinary goals of care discussion [09/25/2020] Code Status:  full code Disposition: ICU   = Goals of Care = Code Status Order: FULL  Primary Emergency Contact: Cates,Joise, Home Phone: [phone] Wishes to pursue full aggressive treatment and  intervention options, including CPR and intubation, but goals of care will be addressed on going with family if that should become necessary.  Critical care time: 45 minutes     Rufina Falco, DNP, CCRN, FNP-C, AGACNP-BC Acute Care Nurse Practitioner  [facility] Pager: [phone] [place] [facility]

## 2021-09-25 NOTE — Interval H&P Note (Signed)
History and Physical Interval Note: ? ?09/25/2021 ?1:06 PM ? ?[name]  has presented today for surgery, with the diagnosis of RLE Angio  BARD   ASO w rest pain.  The various methods of treatment have been discussed with the patient and family. After consideration of risks, benefits and other options for treatment, the patient has consented to  Procedure(s): ?Lower Extremity Angiography (Right) as a surgical intervention.  The patient's history has been reviewed, patient examined, no change in status, stable for surgery.  I have reviewed the patient's chart and labs.  Questions were answered to the patient's satisfaction.   ? ? ?Joshua [name] ? ? ?

## 2021-09-25 NOTE — Op Note (Signed)
Joshua [name] ? Percutaneous Study/Intervention Procedural Note ? ? ?Date of Surgery: 09/25/2021,3:54 PM ? ?Surgeon:Deshay Kirstein, Dolores Lory  ? 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 ?Post-operative diagnosis:  Same ? ?Procedure(s) Performed: ? 1.  Abdominal aortogram ? 2.  Right lower extremity angiography third order catheter placement ? 3.  Percutaneous transluminal angioplasty of the proximal peroneal and tibioperoneal trunk to 2.5 mm with an ultra score balloon ? 4.  Percutaneous transluminal angioplasty of the femoral artery and the proximal anastomosis of the bypass graft to 4 mm with an ultra score balloon ? 5.  Administration of 6 mg of tPA throughout the entire bypass and in the peroneal artery ? 6.  Initiation of continuous tPA infusion right lower extremity for limb salvage ? 7.  Insertion of a triple-lumen catheter left common femoral vein with ultrasound and fluoroscopic guidance ? ?Anesthesia: Conscious sedation was administered by the interventional radiology RN under my direct supervision. IV Versed plus fentanyl were utilized. Continuous ECG, pulse oximetry and blood pressure was monitored throughout the entire procedure. Conscious sedation was administered for a total of 68 minutes. ? ?Sheath: 6 French 45 cm destination sheath left common femoral retrograde ? ?Contrast: 50 cc  ? ?Fluoroscopy Time: 8.1 minutes ? ?Indications: The patient presented to the office with increasing complaints of pain in the right lower extremity.  This was acute in onset shortly after he had stopped his anticoagulation in preparation for colonoscopy.  Noninvasive studies as well as physical examination confirmed he had a once again thrombosed his vascular reconstruction and was having ischemic rest pain.  Risk and benefits  for intervention for limb salvage were reviewed all questions answered patient has agreed to proceed. ? ?Procedure:  Joshua Fina Blackwellis a 66 y.o. male who was identified and appropriate procedural time out was performed.  The patient was then placed supine on the table and prepped and draped in the usual sterile fashion.  Ultrasound was used to evaluate the left common femoral artery.  It was echolucent and pulsatile indicating it is patent .  An ultrasound image was acquired for the permanent record.  A micropuncture needle was used to access the left common femoral artery under direct ultrasound guidance.  The microwire was then advanced under fluoroscopic guidance without difficulty followed by the micro-sheath.  A 0.035 J wire was advanced without resistance and a 5Fr sheath was placed.   ? ?Pigtail catheter was then advanced over the wire and positioned at the level of T12.  AP projection of the abdominal aorta was obtained.  Pigtail catheter was then repositioned to above the bifurcation and LAO projection of the aortic bifurcation and iliac arteries was obtained.  Advantage wire and the pigtail catheter were used to cross the aortic bifurcation and the pigtail catheter advanced down into the distal right external iliac artery.  A steep RAO projection of the femoral bifurcation was then performed by hand-injection.  This confirmed occlusion of the bypass graft there is also significant disease noted in the distal profunda femoris. ? ?The advantage wire was then negotiated into the distal profunda femoris and the 5 [place] sheath exchanged for 6 [place] destination sheath which was positioned with its tip in the proximal common femoral. ? ?5000 units of heparin was given and allowed to circulate for several minutes. ? ?Using a 100 [place] catheter and the advantage wire  I was able to select the bypass graft and negotiate the catheter wire down into the distal bypass.  The Kumpe catheter was not long enough and  it was exchanged for a [place] cross catheter.  Using the combination of the advantage wire and the [place] cross catheter I was able to negotiate across the distal bypass and into the tibioperoneal trunk.  Hand-injection contrast demonstrated diffuse disease with multilevel occlusion of the peroneal as well as the posterior tibial both of which were widely patent on the last angiogram.  Also appears to be a high-grade stenosis at the distal end of the bypass.  I was able to negotiate the [facility] cross into the mid peroneal and exchanged the advantage wire for a 0.014 run-through wire. ? ?A 2.5 mm x 100 mm ultra score balloon was then advanced over the run-through wire and positioned with its tip in the mid peroneal.  Angioplasty was to 10 atm for 1 minute.  Follow-up imaging demonstrated patency of the peroneal.  Balloon was removed and a 5 mm x 40 mm ultra score balloon was advanced across the proximal anastomosis of the bypass.  Inflation was to 10 atm for approximately 1 minute.  Follow-up imaging now demonstrated that there was flow. ? ?A infusion catheter with a 50 cm infusion length was then advanced over the run-through wire initially was positioned into the peroneal where 5 mg of tPA was administered it was then pulled back so that the proximal marker was just above the anastomosis of the bypass graft in the common femoral.  An additional 5 milligrams of tPA was given.  The infusion catheter was then tapped.  It was secured as was the sheath with 0 silk.  The sheath was then flushed with heparinized saline.  Once pharmacy is prepared we will infuse tPA through the catheter and 400 units of heparin through the sheath sideport. ? ?Attention was then turned to placing a central line.  Ultrasound is in a sterile sleeve is used to evaluate the femoral vein on the left.  1% lidocaine is infiltrated soft tissue the femoral vein is identified is echolucent and compressible indicating patency.  Images recorded for the  permanent record.  A microneedle was then inserted directly into the vein under ultrasound guidance.  Microwire was then advanced followed by micro sheath.   The microwire location is then confirmed under fluoroscopy to ensure it is in the venous system.  J-wire followed by the dilator and then the triple-lumen catheter.  Initially there is poor blood flow from the brown port and therefore the J-wire is advanced under fluoroscopic guidance the catheter was repositioned slightly and then all 3 lm aspirated easily and are flushed with heparinized saline.  Catheter secured to the thigh with silk suture. ? ?Sterile dressings were applied to secure both the arterial sheath as well as the triple-lumen catheter. ? ?Findings:  ? Aortogram and right lower extremity: The abdominal aorta is opacified with bolus injection contrast.  It is widely patent.  Aortic bifurcation bilateral common internal and external iliac arteries are all widely patent. ? ?The right common femoral demonstrates changes consistent with previous endarterectomy.  The profunda femoris is patent in its origin and proximally however as you follow the artery distally see either eccentric plaque or thrombus.  There is also high-grade stenosis in the main trunk in the mid thigh level.  The SFA is occluded previously placed stents are noted they are occluded.  The femoral to tibioperoneal trunk bypass  is occluded it also has had numerous stents placed marking its location.  Upon crossing the bypass the catheter located in the tibioperoneal trunk demonstrates multiple occlusions within the peroneal and posterior tibial that were not present on his most recent study.  Distally toward the ankle the peroneal is clearly patent from its midportion down and will hopefully serve as the target for distal runoff. ?  ? ? ?Disposition: Patient was taken to the recovery room in stable condition having tolerated the procedure well. ? ?[name] ?09/25/2021,3:54 PM  ?

## 2021-09-25 NOTE — Progress Notes (Signed)
ANTICOAGULATION CONSULT NOTE ? ?Pharmacy Consult for IV heparin ?Indication: DVT ? ?Allergies  ?Allergen Reactions  ? Tape   ?  Honeycomb Dressing tape- burns his skin ?  ? ? ?Patient Measurements: ?  ?Heparin Dosing Weight: 93.9 kg (~98.8 kg) ? ?Vital Signs: ?Temp: 97.8 ?F (36.6 ?C) (03/08 1133) ?Temp Source: Oral (03/08 1133) ?BP: 149/77 (03/08 1730) ?Pulse Rate: 75 (03/08 1730) ? ?Labs: ?Recent Labs  ?  09/25/21 ?1134  ?CREATININE 1.23  ? ? ?Estimated Creatinine Clearance: 71.9 mL/min (by C-G formula based on SCr of 1.23 mg/dL). ? ? ?Medical History: ?Past Medical History:  ?Diagnosis Date  ? Allergy   ? Cancer [facility])   ? lung  ? Dyspnea   ? former smoker. only doe  ? History of kidney stones   ? Hypertension   ? Peripheral vascular disease ([place])   ? Pre-diabetes   ? Sleep apnea   ? ? ?Medications:  ?Medications Prior to Admission  ?Medication Sig Dispense Refill Last Dose  ? acetaminophen (TYLENOL) 325 MG tablet Take 650 mg by mouth every 6 (six) hours as needed for moderate pain.   Past Month  ? clopidogrel (PLAVIX) 75 MG tablet TAKE 1 TABLET BY MOUTH  DAILY 90 tablet 3 09/25/2021  ? ELIQUIS 5 MG TABS tablet TAKE 1 TABLET BY MOUTH  TWICE DAILY 120 tablet 5 09/22/2021  ? KLOR-CON M20 20 MEQ tablet Take 1 tablet by mouth once daily 60 tablet 0 09/25/2021  ? levothyroxine (SYNTHROID) 88 MCG tablet Take 1 tablet (88 mcg total) by mouth daily before breakfast. 30 tablet 1 09/25/2021  ? lidocaine-prilocaine (EMLA) cream Apply to affected area once (Patient taking differently: Apply 1 application topically daily as needed (port access).) 30 g 3   ? simvastatin (ZOCOR) 40 MG tablet Take 40 mg by mouth at bedtime.   09/24/2021  ? Tiotropium Bromide-Olodaterol (STIOLTO RESPIMAT) 2.5-2.5 MCG/ACT AERS Inhale 2 puffs into the lungs daily. 4 g 0 09/25/2021  ? ?Scheduled:  ? arformoterol  15 mcg Nebulization BID  ? And  ? umeclidinium bromide  1 puff Inhalation Daily  ? fentaNYL      ? heparin sodium (porcine)      ? [START ON 09/26/2021]  levothyroxine  88 mcg Oral QAC breakfast  ? metoprolol tartrate  5 mg Intravenous Q6H  ? midazolam      ? sodium chloride flush  3 mL Intravenous Q12H  ? ?Infusions:  ? sodium chloride 75 mL/hr at 09/25/21 1139  ? sodium chloride    ? alteplase (LIMB ISCHEMIA) 10 mg in normal saline (0.02 mg/mL) infusion 1 mg/hr (09/25/21 1605)  ? Followed by  ? alteplase (LIMB ISCHEMIA) 10 mg in normal saline (0.02 mg/mL) infusion    ? heparin 400 Units/hr (09/25/21 1555)  ? heparin    ? ?PRN: sodium chloride, alteplase, diphenhydrAMINE, famotidine, fentaNYL, heparin sodium (porcine), hydrALAZINE, HYDROmorphone (DILAUDID) injection, labetalol, methylPREDNISolone (SOLU-MEDROL) injection, midazolam, midazolam, midazolam, morphine injection, ondansetron (ZOFRAN) IV, ondansetron (ZOFRAN) IV, sodium chloride flush ?Anti-infectives (From admission, onward)  ? ? Start     Dose/Rate Route Frequency Ordered Stop  ? 09/25/21 0030  ceFAZolin (ANCEF) IVPB 2g/100 mL premix       ? 2 g ?200 mL/hr over 30 Minutes Intravenous  Once 09/25/21 0018 09/25/21 1456  ? ?  ? ? ?Assessment: ?65YOM presenting with pain in right calf and cramping in toes. Undergoing aortogram of right lower extremity. ? ?Goal of Therapy ?Heparin level 0.2-0.5 units/ml  ?Monitor platelets by  anticoagulation protocol: Yes ?  ?Plan:  ?Continue heparin 400 units/hr ?6 hour heparin level and fibrinogen while on heparin and alteplase. Pharmacy to make adjustments PRN to target heparin level goal. ?Daily CBC ? ? ?Wynelle Cleveland, PharmD ?Pharmacy Resident  ?09/25/2021 ?5:51 PM ? ?

## 2021-09-26 ENCOUNTER — Inpatient Hospital Stay: Payer: Medicare Other

## 2021-09-26 ENCOUNTER — Encounter: Payer: Self-pay | Admitting: Vascular Surgery

## 2021-09-26 ENCOUNTER — Encounter
Admission: AD | Disposition: A | Discharge status: Home / Self Care | Payer: Self-pay | Source: Home / Self Care | Attending: Vascular Surgery

## 2021-09-26 DIAGNOSIS — I70221 Atherosclerosis of native arteries of extremities with rest pain, right leg: Secondary | ICD-10-CM

## 2021-09-26 HISTORY — PX: LOWER EXTREMITY ANGIOGRAPHY: CATH118251

## 2021-09-26 LAB — CBC
HCT: 34.6 % — ABNORMAL LOW (ref 39.0–52.0)
HCT: 35.4 % — ABNORMAL LOW (ref 39.0–52.0)
Hemoglobin: 11.2 g/dL — ABNORMAL LOW (ref 13.0–17.0)
Hemoglobin: 11.4 g/dL — ABNORMAL LOW (ref 13.0–17.0)
MCH: 28.2 pg (ref 26.0–34.0)
MCH: 28.6 pg (ref 26.0–34.0)
MCHC: 32.2 g/dL (ref 30.0–36.0)
MCHC: 32.4 g/dL (ref 30.0–36.0)
MCV: 87.6 fL (ref 80.0–100.0)
MCV: 88.5 fL (ref 80.0–100.0)
Platelets: 133 10*3/uL — ABNORMAL LOW (ref 150–400)
Platelets: 134 10*3/uL — ABNORMAL LOW (ref 150–400)
RBC: 3.91 MIL/uL — ABNORMAL LOW (ref 4.22–5.81)
RBC: 4.04 MIL/uL — ABNORMAL LOW (ref 4.22–5.81)
RDW: 13.7 % (ref 11.5–15.5)
RDW: 13.7 % (ref 11.5–15.5)
WBC: 5.5 10*3/uL (ref 4.0–10.5)
WBC: 5.5 10*3/uL (ref 4.0–10.5)
nRBC: 0 % (ref 0.0–0.2)
nRBC: 0 % (ref 0.0–0.2)

## 2021-09-26 LAB — BASIC METABOLIC PANEL
Anion gap: 10 (ref 5–15)
BUN: 15 mg/dL (ref 8–23)
CO2: 27 mmol/L (ref 22–32)
Calcium: 8.9 mg/dL (ref 8.9–10.3)
Chloride: 104 mmol/L (ref 98–111)
Creatinine, Ser: 1.07 mg/dL (ref 0.61–1.24)
GFR, Estimated: >60 mL/min (ref >60)
Glucose, Bld: 105 mg/dL — ABNORMAL HIGH (ref 70–99)
Potassium: 4 mmol/L (ref 3.5–5.1)
Sodium: 141 mmol/L (ref 135–145)

## 2021-09-26 LAB — PROTIME-INR
INR: 1.2 (ref 0.8–1.2)
Prothrombin Time: 15.4 seconds — ABNORMAL HIGH (ref 11.4–15.2)

## 2021-09-26 LAB — CBC WITH DIFFERENTIAL/PLATELET
Abs Immature Granulocytes: 0.01 10*3/uL (ref 0.00–0.07)
Basophils Absolute: 0 10*3/uL (ref 0.0–0.1)
Basophils Relative: 1 %
Eosinophils Absolute: 0.1 10*3/uL (ref 0.0–0.5)
Eosinophils Relative: 2 %
HCT: 37.1 % — ABNORMAL LOW (ref 39.0–52.0)
Hemoglobin: 12.2 g/dL — ABNORMAL LOW (ref 13.0–17.0)
Immature Granulocytes: 0 %
Lymphocytes Relative: 9 %
Lymphs Abs: 0.6 10*3/uL — ABNORMAL LOW (ref 0.7–4.0)
MCH: 28.6 pg (ref 26.0–34.0)
MCHC: 32.9 g/dL (ref 30.0–36.0)
MCV: 87.1 fL (ref 80.0–100.0)
Monocytes Absolute: 0.6 10*3/uL (ref 0.1–1.0)
Monocytes Relative: 9 %
Neutro Abs: 5.5 10*3/uL (ref 1.7–7.7)
Neutrophils Relative %: 79 %
Platelets: 141 10*3/uL — ABNORMAL LOW (ref 150–400)
RBC: 4.26 MIL/uL (ref 4.22–5.81)
RDW: 13.7 % (ref 11.5–15.5)
WBC: 6.8 10*3/uL (ref 4.0–10.5)
nRBC: 0 % (ref 0.0–0.2)

## 2021-09-26 LAB — HEPARIN LEVEL (UNFRACTIONATED): Heparin Unfractionated: 0.36 IU/mL (ref 0.30–0.70)

## 2021-09-26 LAB — GLUCOSE, CAPILLARY: Glucose-Capillary: 131 mg/dL — ABNORMAL HIGH (ref 70–99)

## 2021-09-26 LAB — APTT: aPTT: 38 seconds — ABNORMAL HIGH (ref 24–36)

## 2021-09-26 LAB — FIBRINOGEN: Fibrinogen: 126 mg/dL — ABNORMAL LOW (ref 210–475)

## 2021-09-26 SURGERY — LOWER EXTREMITY ANGIOGRAPHY
Anesthesia: Moderate Sedation | Laterality: Right

## 2021-09-26 MED ORDER — DIPHENHYDRAMINE HCL 50 MG/ML IJ SOLN: 50.0000 mg | Freq: Once | INTRAMUSCULAR | Status: DC | PRN

## 2021-09-26 MED ORDER — TIROFIBAN (AGGRASTAT) BOLUS VIA INFUSION: 25.0000 ug/kg | Freq: Once | INTRAVENOUS | Status: AC

## 2021-09-26 MED ORDER — HEPARIN SODIUM (PORCINE) 1000 UNIT/ML IJ SOLN
INTRAMUSCULAR | Status: DC | PRN
  Administered 2021-09-26: 3000 [IU] via INTRAVENOUS

## 2021-09-26 MED ORDER — IODIXANOL 320 MG/ML IV SOLN
INTRAVENOUS | Status: DC | PRN
  Administered 2021-09-26: 09:00:00 40 mL

## 2021-09-26 MED ORDER — HYDROMORPHONE HCL 1 MG/ML IJ SOLN
1.0000 mg | Freq: Once | INTRAMUSCULAR | Status: DC | PRN
Start: 2021-09-26 — End: 2021-09-27

## 2021-09-26 MED ORDER — MIDAZOLAM HCL 2 MG/2ML IJ SOLN
INTRAMUSCULAR | Status: AC
  Filled 2021-09-26: qty 4

## 2021-09-26 MED ORDER — TIROFIBAN HCL IN NACL 5-0.9 MG/100ML-% IV SOLN
0.0750 ug/kg/min | INTRAVENOUS | Status: AC
  Administered 2021-09-26: 10:00:00 0.15 ug/kg/min via INTRAVENOUS
  Filled 2021-09-26 (×2): qty 100

## 2021-09-26 MED ORDER — TIROFIBAN HCL IV 12.5 MG/250 ML
INTRAVENOUS | Status: AC
  Administered 2021-09-26: 10:00:00 2282.5 ug via INTRAVENOUS
  Filled 2021-09-26: qty 250

## 2021-09-26 MED ORDER — IOHEXOL 300 MG/ML  SOLN
100.0000 mL | Freq: Once | INTRAMUSCULAR | Status: AC | PRN
  Administered 2021-09-26: 15:00:00 100 mL via INTRAVENOUS

## 2021-09-26 MED ORDER — FENTANYL CITRATE (PF) 100 MCG/2ML IJ SOLN
INTRAMUSCULAR | Status: AC
  Filled 2021-09-26: qty 2

## 2021-09-26 MED ORDER — MIDAZOLAM HCL 2 MG/2ML IJ SOLN
INTRAMUSCULAR | Status: DC | PRN
  Administered 2021-09-26: 2 mg via INTRAVENOUS

## 2021-09-26 MED ORDER — ONDANSETRON HCL 4 MG/2ML IJ SOLN: 4.0000 mg | Freq: Four times a day (QID) | INTRAMUSCULAR | Status: DC | PRN

## 2021-09-26 MED ORDER — HEPARIN SODIUM (PORCINE) 1000 UNIT/ML IJ SOLN
INTRAMUSCULAR | Status: AC
  Filled 2021-09-26: qty 10

## 2021-09-26 MED ORDER — CEFAZOLIN SODIUM-DEXTROSE 2-4 GM/100ML-% IV SOLN
INTRAVENOUS | Status: AC
  Administered 2021-09-26: 09:00:00 2 g via INTRAVENOUS
  Filled 2021-09-26: qty 100

## 2021-09-26 MED ORDER — CEFAZOLIN SODIUM-DEXTROSE 2-4 GM/100ML-% IV SOLN: 2.0000 g | Freq: Once | INTRAVENOUS | Status: DC

## 2021-09-26 MED ORDER — FAMOTIDINE 20 MG PO TABS: 40.0000 mg | ORAL_TABLET | Freq: Once | ORAL | Status: DC | PRN

## 2021-09-26 MED ORDER — SODIUM CHLORIDE 0.9 % IV SOLN: INTRAVENOUS | Status: DC

## 2021-09-26 MED ORDER — MIDAZOLAM HCL 2 MG/ML PO SYRP: 8.0000 mg | ORAL_SOLUTION | Freq: Once | ORAL | Status: DC | PRN

## 2021-09-26 MED ORDER — FENTANYL CITRATE (PF) 100 MCG/2ML IJ SOLN
INTRAMUSCULAR | Status: DC | PRN
  Administered 2021-09-26: 50 ug via INTRAVENOUS

## 2021-09-26 MED ORDER — METHYLPREDNISOLONE SODIUM SUCC 125 MG IJ SOLR: 125.0000 mg | Freq: Once | INTRAMUSCULAR | Status: DC | PRN

## 2021-09-26 SURGICAL SUPPLY — 11 items
BALLN LUTONIX 018 4X80X130 (BALLOONS) ×2
BALLN ULTRVRSE 2.5X300X150 (BALLOONS) ×2
BALLOON LUTONIX 018 4X80X130 (BALLOONS) IMPLANT
BALLOON ULTRVRSE 2.5X300X150 (BALLOONS) IMPLANT
CATH NAVICROSS ANGLED 135CM (MICROCATHETER) ×1 IMPLANT
DEVICE SAFEGUARD 24CM (GAUZE/BANDAGES/DRESSINGS) ×1 IMPLANT
DEVICE STARCLOSE SE CLOSURE (Vascular Products) ×1 IMPLANT
KIT ENCORE 26 ADVANTAGE (KITS) ×1 IMPLANT
PACK ANGIOGRAPHY (CUSTOM PROCEDURE TRAY) ×2 IMPLANT
WIRE G V18X300CM (WIRE) ×1 IMPLANT
WIRE GUIDERIGHT .035X150 (WIRE) ×1 IMPLANT

## 2021-09-26 NOTE — Op Note (Signed)
[facility] ? Percutaneous Study/Intervention Procedural Note ? ? ?Date of Surgery: 09/26/2021 ? ?Surgeon(s):Joshua [name]   ? ?Assistants:none ? ?Pre-operative Diagnosis: PAD with rest [name], status post overnight thrombolytic therapy ? ?Post-operative diagnosis:  Same ? ?Procedure(s) Performed: ?            1.  Right lower extremity angiogram ?            2.  Percutaneous transluminal angioplasty of the right posterior tibial artery with 2.5 mm diameter by 30 cm length angioplasty balloon ?            3.  Percutaneous transluminal angioplasty of the distal bypass anastomosis and distal bypass graft analogous to the popliteal artery with 4 mm diameter by 8 cm length Lutonix drug-coated angioplasty balloon ?            4. StarClose closure device left femoral artery ? ?EBL: 5 cc ? ?Contrast: 40 cc ? ?Fluoro Time: 2.7 minutes ? ?Moderate Conscious Sedation Time: approximately 23 minutes using 2 mg of Versed and 50 mcg of Fentanyl ?             ?Indications:  Patient is a 66 y.o.male with severe peripheral arterial disease who has been getting overnight thrombolytic therapy and is brought back for second look angiogram. Due to the limb threatening nature of the situation, angiogram was performed for attempted limb salvage. The patient is aware that if the procedure fails, amputation would be expected.  The patient also understands that even with successful revascularization, amputation may still be required due to the severity of the situation.  Risks and benefits are discussed and informed consent is obtained.  ? ?Procedure:  The patient was identified and appropriate procedural time out was performed.  The patient was then placed supine on the table and prepped and draped in the usual sterile fashion. Moderate conscious sedation was administered during a face to face encounter with the patient throughout the procedure with my supervision of the RN administering medicines and monitoring the  patient's vital signs, pulse oximetry, telemetry and mental status throughout from the start of the procedure until the patient was taken to the recovery room.  The existing thrombolytic catheter was removed over a V18 wire which was parked in the distal posterior tibial artery.  Selective right lower extremity angiogram was then performed. The common femoral artery, profunda femoris artery, and the proximal portion of the bypass graft are all widely patent.  The bypass graft is patent down to a hyperplastic stenosis in the distal portion of the bypass graft and at the distal anastomosis creating greater than 60% stenosis.  The peroneal artery was then the dominant runoff with occlusion of the proximal and mid segments of the posterior tibial arteries.  The posterior tibial artery then reconstitutes in the mid segments and is continuous to the foot.  I then proceeded with treatment.  The posterior tibial artery was treated with a 2.5 mm diameter by 30 cm length angioplasty balloon inflated to 8 atm for 1 minute.  The distal bypass anastomosis and distal portion of the bypass graft analogous to the popliteal artery were treated with a 4 mm diameter by 8 cm length Lutonix drug-coated angioplasty balloon inflated to 6 atm for 1 minute.  Completion imaging showed less than 20% stenosis in the distal bypass anastomosis and distal graft.  The peroneal artery remained widely patent.  There was marked spasm in the posterior tibial artery so was difficult to discern  the degree of stenosis, but it was now continuous to the foot.  I elected to terminate the procedure. The sheath was removed and StarClose closure device was deployed in the left femoral artery with excellent hemostatic result. The patient was taken to the recovery room in stable condition having tolerated the procedure well. ? ?Findings:   ?           ?            Right Lower Extremity: The common femoral artery, profunda femoris artery, and the proximal portion  of the bypass graft are all widely patent.  The bypass graft is patent down to a hyperplastic stenosis in the distal portion of the bypass graft and at the distal anastomosis creating greater than 60% stenosis.  The peroneal artery was then the dominant runoff with occlusion of the proximal and mid segments of the posterior tibial arteries.  The posterior tibial artery then reconstitutes in the mid segments and is continuous to the foot. ? ? ?Disposition: Patient was taken to the recovery room in stable condition having tolerated the procedure well. ? ?Complications: None ? ?Joshua [name] ?09/26/2021 ?9:09 AM ? ? ?This note was created with Dragon Medical transcription system. Any errors in dictation are purely unintentional.  ?

## 2021-09-26 NOTE — Progress Notes (Signed)
NAME:  Joshua [name], MRN:  [id number], DOB:  Dec 11, 1955, LOS: 1 ADMISSION DATE:  09/25/2021, CONSULTATION DATE:  09/25/21 REFERRING MD:  Dr Delana Meyer, CHIEF COMPLAINT:  Atherosclerosis of right lower extremity with rest pain   Brief Pt Description / Synopsis:  66 y.o. male with Atherosclerosis of the RLE with rest pain who underwent elective RLE angiogram on 3/8.  Requires continuous tPA infusion overnight with plan for repeat Angiogram on 3/9.  History of Present Illness:  66 year old male with significant past medical history of PVD, squamous cell carcinoma of the right lung, mediastinal adenopathy with hemoptysis, hypothyroidism, prediabetes, OSA, and hypertension who presented to the hospital for elective procedure.  -09/26/21- patient doing well, denies pain, shares he is irritated due to bells/whistles going off for now reason hindering his sleep. Eating well vital signs are stable  Pertinent  Medical History  Peripheral vascular disease Hypertension Lung cancer Obstructive sleep apnea Prediabetes  Consults:  PCCM  Procedures:  3/8: Lower Extremity Angiography (Right) as a surgical intervention  Significant Diagnostic Tests:  None  Micro Data:  3/8: SARS-CoV-2 PCR>  3/8: Influenza PCR>   Antimicrobials:  Cefazolin 3/8 x 2 doses  OBJECTIVE  Blood pressure 109/64, pulse 76, temperature 97.6 F (36.4 C), temperature source Oral, resp. rate 12, height 5\' 11"  (1.803 m), weight 91.3 kg, SpO2 97 %.        Intake/Output Summary (Last 24 hours) at 09/26/2021 1708 Last data filed at 09/26/2021 1030 Gross per 24 hour  Intake 272.58 ml  Output 2800 ml  Net -2527.42 ml    Filed Weights   09/25/21 1755 09/26/21 0709  Weight: 91.3 kg 91.3 kg   Physical Examination  GENERAL: 66 year-old critically ill patient lying in the bed with no acute distress.  EYES: Pupils equal, round, reactive to light and accommodation. No scleral icterus. Extraocular muscles intact.  HEENT: Head  atraumatic, normocephalic. Oropharynx and nasopharynx clear.  NECK:  Supple, no jugular venous distention. No thyroid enlargement, no tenderness.  LUNGS: Normal breath sounds bilaterally, no wheezing, rales,rhonchi or crepitation. No use of accessory muscles of respiration.  CARDIOVASCULAR: S1, S2 normal. No murmurs, rubs, or gallops.  ABDOMEN: Soft, nontender, nondistended. Bowel sounds present. No organomegaly or mass.  EXTREMITIES: No pedal edema, cyanosis, or clubbing.  Left groin site without hematoma or significant erythema. NEUROLOGIC: Cranial nerves II through XII are intact.  Muscle strength 5/5 in all extremities. Sensation decreased in the lower extremities. Gait not checked.  PSYCHIATRIC: The patient is alert and oriented x 3.  SKIN: No obvious rash, lesion, or ulcer.   Labs/imaging that I havepersonally reviewed  (right click and "Reselect all SmartList Selections" daily)     Labs   CBC: Recent Labs  Lab 09/25/21 1840 09/25/21 2333 09/26/21 0600  WBC 4.6 5.5 5.5  HGB 11.3* 11.4* 11.2*  HCT 34.9* 35.4* 34.6*  MCV 87.3 87.6 88.5  PLT 147* 133* 134*     Basic Metabolic Panel: Recent Labs  Lab 09/25/21 1134 09/26/21 0600  NA  --  141  K  --  4.0  CL  --  104  CO2  --  27  GLUCOSE  --  105*  BUN 21 15  CREATININE 1.23 1.07  CALCIUM  --  8.9    GFR: Estimated Creatinine Clearance: 81.7 mL/min (by C-G formula based on SCr of 1.07 mg/dL). Recent Labs  Lab 09/25/21 1840 09/25/21 2333 09/26/21 0600  WBC 4.6 5.5 5.5     Liver Function  Tests: No results for input(s): AST, ALT, ALKPHOS, BILITOT, PROT, ALBUMIN in the last 168 hours. No results for input(s): LIPASE, AMYLASE in the last 168 hours. No results for input(s): AMMONIA in the last 168 hours.  ABG No results found for: PHART, PCO2ART, PO2ART, HCO3, TCO2, ACIDBASEDEF, O2SAT   Coagulation Profile: No results for input(s): INR, PROTIME in the last 168 hours.  Cardiac Enzymes: No results for  input(s): CKTOTAL, CKMB, CKMBINDEX, TROPONINI in the last 168 hours.  HbA1C: Hgb A1c MFr Bld  Date/Time Value Ref Range Status  11/21/2020 11:50 AM 5.8 (H) 4.8 - 5.6 % Final    Comment:    (NOTE) Pre diabetes:          5.7%-6.4%  Diabetes:              >6.4%  Glycemic control for   <7.0% adults with diabetes   04/16/2020 03:25 PM 5.5 4.8 - 5.6 % Final    Comment:             Prediabetes: 5.7 - 6.4          Diabetes: >6.4          Glycemic control for adults with diabetes: <7.0     CBG: Recent Labs  Lab 09/25/21 1751  GLUCAP 74     Past Medical History  He,  has a past medical history of Allergy, Cancer ([place]), Dyspnea, History of kidney stones, Hypertension, Peripheral vascular disease ([place]), Pre-diabetes, and Sleep apnea.   Surgical History    Past Surgical History:  Procedure Laterality Date   ANTERIOR CERVICAL DECOMP/DISCECTOMY FUSION N/A 03/14/2020   Procedure: ANTERIOR CERVICAL DECOMPRESSION/DISCECTOMY FUSION 3 LEVELS C4-7;  Surgeon: Meade Maw, MD;  Location: [facility] ORS;  Service: Neurosurgery;  Laterality: N/A;   BACK SURGERY  2011   CENTRAL LINE INSERTION Right 09/10/2016   Procedure: CENTRAL LINE INSERTION;  Surgeon: Katha Cabal, MD;  Location: [facility] ORS;  Service: Vascular;  Laterality: Right;   CERVICAL FUSION     C4-c7 on Mar 14, 2020   COLONOSCOPY  2013 ?   COLONOSCOPY N/A 09/09/2021   Procedure: COLONOSCOPY;  Surgeon: [name], DO;  Location: [facility] ENDOSCOPY;  Service: Gastroenterology;  Laterality: N/A;   CORONARY ANGIOPLASTY     ESOPHAGOGASTRODUODENOSCOPY N/A 09/09/2021   Procedure: ESOPHAGOGASTRODUODENOSCOPY (EGD);  Surgeon: [name], DO;  Location: [facility] ENDOSCOPY;  Service: Gastroenterology;  Laterality: N/A;   FEMORAL-POPLITEAL BYPASS GRAFT Right 09/10/2016   Procedure: BYPASS GRAFT FEMORAL-POPLITEAL ARTERY ( BELOW KNEE );  Surgeon: Katha Cabal, MD;  Location: [facility] ORS;  Service: Vascular;  Laterality: Right;    FEMORAL-TIBIAL BYPASS GRAFT Right 03/23/2020   Procedure: BYPASS GRAFT RIGHT FEMORAL- DISTAL TIBIAL ARTERY WITH DISTAL FLOW GRAFT;  Surgeon: Katha Cabal, MD;  Location: [facility] ORS;  Service: Vascular;  Laterality: Right;   LEFT HEART CATH AND CORONARY ANGIOGRAPHY Left 05/16/2021   Procedure: LEFT HEART CATH AND CORONARY ANGIOGRAPHY;  Surgeon: Andrez Grime, MD;  Location: [place] CV LAB;  Service: Cardiovascular;  Laterality: Left;   LOWER EXTREMITY ANGIOGRAPHY Right 11/18/2016   Procedure: Lower Extremity Angiography;  Surgeon: Katha Cabal, MD;  Location: [place] CV LAB;  Service: Cardiovascular;  Laterality: Right;   LOWER EXTREMITY ANGIOGRAPHY Right 12/09/2016   Procedure: Lower Extremity Angiography;  Surgeon: Katha Cabal, MD;  Location: [place] [facility] LAB;  Service: Cardiovascular;  Laterality: Right;   LOWER EXTREMITY ANGIOGRAPHY Right 11/15/2019   Procedure: LOWER EXTREMITY ANGIOGRAPHY;  Surgeon:  Schnier, Dolores Lory, MD;  Location: [place] CV LAB;  Service: Cardiovascular;  Laterality: Right;   LOWER EXTREMITY ANGIOGRAPHY Right 12/01/2019   Procedure: Lower Extremity Angiography;  Surgeon: Algernon Huxley, MD;  Location: [place] [facility] LAB;  Service: Cardiovascular;  Laterality: Right;   LOWER EXTREMITY ANGIOGRAPHY Right 12/02/2019   Procedure: LOWER EXTREMITY ANGIOGRAPHY;  Surgeon: Katha Cabal, MD;  Location: [place] CV LAB;  Service: Cardiovascular;  Laterality: Right;   LOWER EXTREMITY ANGIOGRAPHY Right 03/23/2020   Procedure: Lower Extremity Angiography;  Surgeon: Katha Cabal, MD;  Location: [place] [facility] LAB;  Service: Cardiovascular;  Laterality: Right;   LOWER EXTREMITY ANGIOGRAPHY Right 09/25/2021   Procedure: Lower Extremity Angiography;  Surgeon: Katha Cabal, MD;  Location: [place] [facility] LAB;  Service: Cardiovascular;  Laterality: Right;   LOWER EXTREMITY ANGIOGRAPHY Right 09/26/2021   Procedure: Lower Extremity  Angiography;  Surgeon: Algernon Huxley, MD;  Location: [place] [facility] LAB;  Service: Cardiovascular;  Laterality: Right;   LOWER EXTREMITY INTERVENTION  11/18/2016   Procedure: Lower Extremity Intervention;  Surgeon: Katha Cabal, MD;  Location: [place] [facility] LAB;  Service: Cardiovascular;;   PERIPHERAL VASCULAR CATHETERIZATION Right 01/02/2015   Procedure: Lower Extremity Angiography;  Surgeon: Katha Cabal, MD;  Location: [place] [facility] LAB;  Service: Cardiovascular;  Laterality: Right;   PERIPHERAL VASCULAR CATHETERIZATION Right 06/18/2015   Procedure: Lower Extremity Angiography;  Surgeon: Algernon Huxley, MD;  Location: [place] [facility] LAB;  Service: Cardiovascular;  Laterality: Right;   PERIPHERAL VASCULAR CATHETERIZATION  06/18/2015   Procedure: Lower Extremity Intervention;  Surgeon: Algernon Huxley, MD;  Location: [place] [facility];  Service: Cardiovascular;;   PERIPHERAL VASCULAR CATHETERIZATION N/A 10/09/2015   Procedure: Abdominal Aortogram w/Lower Extremity;  Surgeon: Katha Cabal, MD;  Location: [place] [facility] LAB;  Service: Cardiovascular;  Laterality: N/A;   PERIPHERAL VASCULAR CATHETERIZATION  10/09/2015   Procedure: Lower Extremity Intervention;  Surgeon: Katha Cabal, MD;  Location: [place] [facility] LAB;  Service: Cardiovascular;;   PERIPHERAL VASCULAR CATHETERIZATION Right 10/10/2015   Procedure: Lower Extremity Angiography;  Surgeon: Algernon Huxley, MD;  Location: [place] [facility] LAB;  Service: Cardiovascular;  Laterality: Right;   PERIPHERAL VASCULAR CATHETERIZATION Right 10/10/2015   Procedure: Lower Extremity Intervention;  Surgeon: Algernon Huxley, MD;  Location: [place] [facility] LAB;  Service: Cardiovascular;  Laterality: Right;   PERIPHERAL VASCULAR CATHETERIZATION Right 12/03/2015   Procedure: Lower Extremity Angiography;  Surgeon: Algernon Huxley, MD;  Location: [place] [facility] LAB;  Service: Cardiovascular;  Laterality: Right;   PERIPHERAL VASCULAR  CATHETERIZATION Right 12/04/2015   Procedure: Lower Extremity Angiography;  Surgeon: Algernon Huxley, MD;  Location: [place] [facility] LAB;  Service: Cardiovascular;  Laterality: Right;   PERIPHERAL VASCULAR CATHETERIZATION  12/04/2015   Procedure: Lower Extremity Intervention;  Surgeon: Algernon Huxley, MD;  Location: [place] [facility] LAB;  Service: Cardiovascular;;   PERIPHERAL VASCULAR CATHETERIZATION Right 06/30/2016   Procedure: Lower Extremity Angiography;  Surgeon: Algernon Huxley, MD;  Location: [place] [facility] LAB;  Service: Cardiovascular;  Laterality: Right;   PERIPHERAL VASCULAR CATHETERIZATION Right 06/25/2016   Procedure: Lower Extremity Angiography;  Surgeon: Katha Cabal, MD;  Location: [place] [facility] LAB;  Service: Cardiovascular;  Laterality: Right;   PERIPHERAL VASCULAR CATHETERIZATION Right 07/01/2016   Procedure: Lower Extremity Angiography;  Surgeon: Katha Cabal, MD;  Location: [place] [facility] LAB;  Service: Cardiovascular;  Laterality: Right;   PERIPHERAL VASCULAR  CATHETERIZATION  07/01/2016   Procedure: Lower Extremity Intervention;  Surgeon: Katha Cabal, MD;  Location: [place] [facility] LAB;  Service: Cardiovascular;;   PERIPHERAL VASCULAR CATHETERIZATION Right 08/01/2016   Procedure: Lower Extremity Angiography;  Surgeon: Katha Cabal, MD;  Location: [place] CV LAB;  Service: Cardiovascular;  Laterality: Right;   PORTA CATH INSERTION N/A 10/05/2020   Procedure: PORTA CATH INSERTION;  Surgeon: Katha Cabal, MD;  Location: [place] [facility] LAB;  Service: Cardiovascular;  Laterality: N/A;   stent placement in right leg Right    VIDEO BRONCHOSCOPY N/A 09/14/2020   Procedure: VIDEO BRONCHOSCOPY WITH FLUORO;  Surgeon: Tyler Pita, MD;  Location: [facility] ORS;  Service: Cardiopulmonary;  Laterality: N/A;   VIDEO BRONCHOSCOPY WITH ENDOBRONCHIAL ULTRASOUND N/A 09/14/2020   Procedure: VIDEO BRONCHOSCOPY WITH ENDOBRONCHIAL ULTRASOUND;  Surgeon: Tyler Pita, MD;  Location: [facility] ORS;  Service: Cardiopulmonary;  Laterality: N/A;     Social History   reports that he quit smoking about 6 years ago. His smoking use included cigarettes. He has a 76.00 pack-year smoking history. He has never used smokeless tobacco. He reports that he does not currently use alcohol. He reports that he does not use drugs.   Family History   His family history includes Diabetes in his mother; Heart murmur in his mother; Hypertension in his mother; Leukemia in his mother; Throat cancer in his maternal grandmother. There is no history of Colon cancer or Breast cancer.   Allergies Allergies  Allergen Reactions   Tape     Honeycomb Dressing tape- burns his skin      Home Medications  Prior to Admission medications   Medication Sig Start Date End Date Taking? Authorizing Provider  acetaminophen (TYLENOL) 325 MG tablet Take 650 mg by mouth every 6 (six) hours as needed for moderate pain.   Yes [provider]  clopidogrel (PLAVIX) 75 MG tablet TAKE 1 TABLET BY MOUTH  DAILY 03/01/21  Yes Schnier, Dolores Lory, MD  ELIQUIS 5 MG TABS tablet TAKE 1 TABLET BY MOUTH  TWICE DAILY 08/12/21  Yes Kris Hartmann, NP  KLOR-CON M20 20 MEQ tablet Take 1 tablet by mouth once daily 09/24/21  Yes [name], MD  levothyroxine (SYNTHROID) 88 MCG tablet Take 1 tablet (88 mcg total) by mouth daily before breakfast. 09/13/21  Yes [name], MD  lidocaine-prilocaine (EMLA) cream Apply to affected area once Patient taking differently: Apply 1 application topically daily as needed (port access). 10/01/20  Yes [name], MD  simvastatin (ZOCOR) 40 MG tablet Take 40 mg by mouth at bedtime. 02/28/21  Yes [provider]  Tiotropium Bromide-Olodaterol (STIOLTO RESPIMAT) 2.5-2.5 MCG/ACT AERS Inhale 2 puffs into the lungs daily. 09/04/21  Yes Tyler Pita, MD  Scheduled Meds:  arformoterol  15 mcg Nebulization BID   And   umeclidinium bromide  1 puff Inhalation Daily   Chlorhexidine  Gluconate Cloth  6 each Topical Q0600   fentaNYL       heparin sodium (porcine)       levothyroxine  88 mcg Oral QAC breakfast   metoprolol tartrate  5 mg Intravenous Q6H   midazolam       sodium chloride flush  3 mL Intravenous Q12H   Continuous Infusions:  sodium chloride     tirofiban Stopped (09/26/21 1143)   PRN Meds:.sodium chloride, hydrALAZINE, HYDROmorphone (DILAUDID) injection, HYDROmorphone (DILAUDID) injection, labetalol, midazolam, morphine injection, ondansetron (ZOFRAN) IV, ondansetron (ZOFRAN) IV, ondansetron (ZOFRAN) IV, sodium chloride flush  Active Hospital Problem list     Assessment & Plan:  RLE Atherosclerosis  with rest pain S/p elective RLE angiogram on 3/8. 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 -As needed labetalol and hydralazine  Pre-Diabetes  -CBGs -Sliding scale insulin -Follow ICU hyper/hypoglycemia protocol  Hypothyroidism -Continue Synthroid  Squamous cell carcinoma of right lung, stage IIIA CN:OBSJ, OSA, former smoker, Radiation pneumonititis -Supplemental O2 as needed to maintain O2 saturations 88 to 92% -s/p radiation and chemo per oncology -Follows with Dr. Tasia Catchings -continue bronchodilators, follows with Dr. Patsey Berthold pulmonologist   Best practice:  Diet:  NPO Pain/Anxiety/Delirium protocol (if indicated): No VAP protocol (if indicated): Not indicated DVT prophylaxis: Systemic AC GI prophylaxis: N/A Glucose control:  SSI Yes Central venous access:  N/A Arterial line:  N/A Foley:  N/A Mobility:  bed rest  PT consulted: N/A Last date of multidisciplinary goals of care discussion [09/25/2020] Code Status:  full  code Disposition: ICU   = Goals of Care = Code Status Order: FULL  Primary Emergency Contact: Cates,Joise, Home Phone: [phone] Wishes to pursue full aggressive treatment and intervention options, including CPR and intubation, but goals of care will be addressed on going with family if that should become necessary.  Critical care provider statement:   Total critical care time: 33 minutes   Performed by: Lanney Gins MD   Critical care time was exclusive of separately billable procedures and treating other patients.   Critical care was necessary to treat or prevent imminent or life-threatening deterioration.   Critical care was time spent personally by me on the following activities: development of treatment plan with patient and/or surrogate as well as nursing, discussions with consultants, evaluation of patient's response to treatment, examination of patient, obtaining history from patient or surrogate, ordering and performing treatments and interventions, ordering and review of laboratory studies, ordering and review of radiographic studies, pulse oximetry and re-evaluation of patient's condition.    Ottie Glazier, M.D.  Pulmonary & Critical Care Medicine

## 2021-09-26 NOTE — Progress Notes (Addendum)
Received patient from specials recovery shortly after 10 am. Patients left femoral site assessed upon admission to unit, inflated PAD in place with 40 cc of air. No active bleeding or hematoma noted. Patient remains flat.  ?1115: Site remains intact, no new bleeding, PAD fully inflated 40 cc air. Informed patient he could sit up in bed now.  ?7510: New bleeding noted at left femoral site, PAD in place but dressing not intact, PAD removed to assess for hematoma, began holding pressure and alerted charge RN. Patient bleeding from left CVC as well as left femoral site from angiogram access/previous sheath placement.  ?1140: Dr. Lucky Cowboy paged due to continued bleeding from femoral site, Aggrastat stopped.  ?1315: This RN sent secure chat to Dr. Lucky Cowboy with concerns regarding continued active bleeding. Previous dressing marked for bleeding. Patient oozing blood from femoral site as well as CVC site at this time.  ?1345: Dr. Lucky Cowboy paged due to patients worsened active bleeding. Continuing to hold pressure. Vasc team came to bedside to assess.  ?1430: New inflated PAD with 40 cc of air applied at bedside by Vasc team, band aid applied to old suture site that has old drainage.  ?2585: Patient taken for STAT abd/pelvis CT per order. ?1500: New dressing placed of guaze and foam tape. No new bleeding at this time.  ?

## 2021-09-26 NOTE — Progress Notes (Addendum)
ANTICOAGULATION CONSULT NOTE ? ?Pharmacy Consult for IV heparin ?Indication: DVT ? ?Allergies  ?Allergen Reactions  ? Tape   ?  Honeycomb Dressing tape- burns his skin ?  ? ? ?Patient Measurements: ?Height: 5\' 11"  (180.3 cm) ?Weight: 91.3 kg (201 lb 4.5 oz) ?IBW/kg (Calculated) : 75.3 ?Heparin Dosing Weight: 93.9 kg (~98.8 kg) ? ?Vital Signs: ?Temp: 97.7 ?F (36.5 ?C) (03/09 2119) ?Temp Source: Oral (03/09 0709) ?BP: 136/80 (03/09 0709) ?Pulse Rate: 74 (03/09 0709) ? ?Labs: ?Recent Labs  ?  09/25/21 ?1134 09/25/21 ?1840 09/25/21 ?1840 09/25/21 ?2333 09/26/21 ?0600  ?HGB  --  11.3*   < > 11.4* 11.2*  ?HCT  --  34.9*  --  35.4* 34.6*  ?PLT  --  147*  --  133* 134*  ?HEPARINUNFRC  --  0.62  --  0.37 0.36  ?CREATININE 1.23  --   --   --  1.07  ? < > = values in this interval not displayed.  ? ? ? ?Estimated Creatinine Clearance: 81.7 mL/min (by C-G formula based on SCr of 1.07 mg/dL). ? ? ?Medical History: ?Past Medical History:  ?Diagnosis Date  ? Allergy   ? Cancer [facility])   ? lung  ? Dyspnea   ? former smoker. only doe  ? History of kidney stones   ? Hypertension   ? Peripheral vascular disease ([place])   ? Pre-diabetes   ? Sleep apnea   ? ? ?Medications:  ?Medications Prior to Admission  ?Medication Sig Dispense Refill Last Dose  ? acetaminophen (TYLENOL) 325 MG tablet Take 650 mg by mouth every 6 (six) hours as needed for moderate pain.   Past Month  ? clopidogrel (PLAVIX) 75 MG tablet TAKE 1 TABLET BY MOUTH  DAILY 90 tablet 3 09/25/2021  ? ELIQUIS 5 MG TABS tablet TAKE 1 TABLET BY MOUTH  TWICE DAILY 120 tablet 5 09/22/2021  ? KLOR-CON M20 20 MEQ tablet Take 1 tablet by mouth once daily 60 tablet 0 09/25/2021  ? levothyroxine (SYNTHROID) 88 MCG tablet Take 1 tablet (88 mcg total) by mouth daily before breakfast. 30 tablet 1 09/25/2021  ? lidocaine-prilocaine (EMLA) cream Apply to affected area once (Patient taking differently: Apply 1 application topically daily as needed (port access).) 30 g 3   ? simvastatin (ZOCOR) 40 MG  tablet Take 40 mg by mouth at bedtime.   09/24/2021  ? Tiotropium Bromide-Olodaterol (STIOLTO RESPIMAT) 2.5-2.5 MCG/ACT AERS Inhale 2 puffs into the lungs daily. 4 g 0 09/25/2021  ? ?Scheduled:  ? [MAR Hold] arformoterol  15 mcg Nebulization BID  ? And  ? [MAR Hold] umeclidinium bromide  1 puff Inhalation Daily  ? [MAR Hold] Chlorhexidine Gluconate Cloth  6 each Topical Q0600  ? [MAR Hold] levothyroxine  88 mcg Oral QAC breakfast  ? [MAR Hold] metoprolol tartrate  5 mg Intravenous Q6H  ? [MAR Hold] sodium chloride flush  3 mL Intravenous Q12H  ? ?Infusions:  ? [MAR Hold] sodium chloride    ? sodium chloride    ? alteplase (LIMB ISCHEMIA) 10 mg in normal saline (0.02 mg/mL) infusion 0.5 mg/hr (09/25/21 2011)  ? ceFAZolin    ?  ceFAZolin (ANCEF) IV    ? heparin    ? ?PRN: [MAR Hold] sodium chloride, diphenhydrAMINE, famotidine, [MAR Hold] hydrALAZINE, [MAR Hold]  HYDROmorphone (DILAUDID) injection, HYDROmorphone (DILAUDID) injection, [MAR Hold] labetalol, methylPREDNISolone (SOLU-MEDROL) injection, midazolam, [MAR Hold] midazolam, [MAR Hold]  morphine injection, [MAR Hold] ondansetron (ZOFRAN) IV, [MAR Hold] ondansetron (ZOFRAN) IV, ondansetron (ZOFRAN)  IV, [MAR Hold] sodium chloride flush ?Anti-infectives (From admission, onward)  ? ? Start     Dose/Rate Route Frequency Ordered Stop  ? 09/26/21 0815  ceFAZolin (ANCEF) IVPB 2g/100 mL premix       ? 2 g ?200 mL/hr over 30 Minutes Intravenous  Once 09/26/21 0810    ? 09/26/21 0729  ceFAZolin (ANCEF) 2-4 GM/100ML-% IVPB       ?Note to Pharmacy: Corlis Hove H: cabinet override  ?    09/26/21 0729 09/26/21 1944  ? 09/25/21 0030  ceFAZolin (ANCEF) IVPB 2g/100 mL premix       ? 2 g ?200 mL/hr over 30 Minutes Intravenous  Once 09/25/21 0018 09/25/21 1456  ? ?  ? ? ?Assessment: ?65YOM w/ h/o PVD, pre-DM, Sq cell carcinoma (Rt lung), RAD pneumonitis h/o hemoptsis, COPD (past smoker) & HTN presenting with pain in right calf and cramping in toes c/f limb-threatening ischemia  (many prior interventions). 3/8 Undergoing aortogram of right lower extremity.  ? ?Meds: Last dose of Eliquis on 3/5 per med rec. Pt last dose of Plavix on 3/8. ? ?Date Time  HL/fibrinogen Rate/Comment ?3/08 1840 0.62 / ---  On 400 un/hr ?** Pharmacy consult added ?3/08 2333 0.37 / 193  Therapeutic; 400 un/hr ?3/09 0600 0.36 / 126  Therapeutic; 400 un/hr   ? ?Baseline Labs: ?aPTT - unk ?INR - unk ?Hgb - 11.4>11.2 ?Plts - 193>126 ? ?Goal of Therapy ?Heparin level 0.2-0.5 units/ml  ?Monitor platelets by anticoagulation protocol: Yes ?  ?Plan:  ?In targeted range w/ concomitant tPA for limb ischemia, Continue heparin infusion at 400 units/hr ?6 hour heparin level and fibrinogen while on heparin and alteplase. Next levels at 12(noon) 3/09 ?Pharmacy to make adjustments PRN to target heparin level goal. ?Daily CBC ? ?Lorna Dibble, PharmD, BCCP ?Clinical Pharmacist ?09/26/2021 8:24 AM ? ? ?

## 2021-09-26 NOTE — Progress Notes (Signed)
?  Transition of Care (TOC) Screening Note ? ? ?Patient Details  ?Name: Joshua [name] ?Date of Birth: [birth year] ? ? ?Transition of Care (TOC) CM/SW Contact:    ?Shelbie Hutching, RN ?Phone Number: ?09/26/2021, 3:21 PM ? ? ? ?Transition of Care Department [facility]) has reviewed patient and no TOC needs have been identified at this time. We will continue to monitor patient advancement through interdisciplinary progression rounds. If new patient transition needs arise, please place a TOC consult. ?  ?

## 2021-09-27 ENCOUNTER — Inpatient Hospital Stay: Payer: Medicare Other

## 2021-09-27 ENCOUNTER — Inpatient Hospital Stay: Payer: Medicare Other | Admitting: Oncology

## 2021-09-27 DIAGNOSIS — I70229 Atherosclerosis of native arteries of extremities with rest pain, unspecified extremity: Secondary | ICD-10-CM

## 2021-09-27 LAB — BASIC METABOLIC PANEL
Anion gap: 6 (ref 5–15)
BUN: 13 mg/dL (ref 8–23)
CO2: 28 mmol/L (ref 22–32)
Calcium: 8.6 mg/dL — ABNORMAL LOW (ref 8.9–10.3)
Chloride: 105 mmol/L (ref 98–111)
Creatinine, Ser: 1.04 mg/dL (ref 0.61–1.24)
GFR, Estimated: >60 mL/min (ref >60)
Glucose, Bld: 113 mg/dL — ABNORMAL HIGH (ref 70–99)
Potassium: 3.8 mmol/L (ref 3.5–5.1)
Sodium: 139 mmol/L (ref 135–145)

## 2021-09-27 LAB — CBC WITH DIFFERENTIAL/PLATELET
Abs Immature Granulocytes: 0.01 10*3/uL (ref 0.00–0.07)
Basophils Absolute: 0 10*3/uL (ref 0.0–0.1)
Basophils Relative: 1 %
Eosinophils Absolute: 0.1 10*3/uL (ref 0.0–0.5)
Eosinophils Relative: 3 %
HCT: 32.2 % — ABNORMAL LOW (ref 39.0–52.0)
Hemoglobin: 10.4 g/dL — ABNORMAL LOW (ref 13.0–17.0)
Immature Granulocytes: 0 %
Lymphocytes Relative: 14 %
Lymphs Abs: 0.8 10*3/uL (ref 0.7–4.0)
MCH: 28.6 pg (ref 26.0–34.0)
MCHC: 32.3 g/dL (ref 30.0–36.0)
MCV: 88.5 fL (ref 80.0–100.0)
Monocytes Absolute: 0.7 10*3/uL (ref 0.1–1.0)
Monocytes Relative: 13 %
Neutro Abs: 3.6 10*3/uL (ref 1.7–7.7)
Neutrophils Relative %: 69 %
Platelets: 120 10*3/uL — ABNORMAL LOW (ref 150–400)
RBC: 3.64 MIL/uL — ABNORMAL LOW (ref 4.22–5.81)
RDW: 13.6 % (ref 11.5–15.5)
WBC: 5.2 10*3/uL (ref 4.0–10.5)
nRBC: 0 % (ref 0.0–0.2)

## 2021-09-27 LAB — PHOSPHORUS: Phosphorus: 4.1 mg/dL (ref 2.5–4.6)

## 2021-09-27 LAB — MAGNESIUM: Magnesium: 2.2 mg/dL (ref 1.7–2.4)

## 2021-09-27 MED ORDER — HYDROCODONE-ACETAMINOPHEN 5-325 MG PO TABS: 1.0000 | ORAL_TABLET | Freq: Four times a day (QID) | ORAL | 0 refills | Status: DC | PRN

## 2021-09-27 NOTE — Discharge Summary (Signed)
?[facility]    ?Discharge Summary ? ? ? ?Patient ID:  ?Joshua [name] ?MRN: [id number] ?DOB/AGE: 1956/01/07 66 y.o. ? ?Admit date: 09/25/2021 ?Discharge date: 09/27/2021 ?Date of Surgery: 09/26/2021 ?Surgeon: Surgeon(s): ?Algernon Huxley, MD ? ?Admission Diagnosis: ?Ischemic leg [I99.8] ? ?Discharge Diagnoses:  ?Ischemic leg [I99.8] ? ?Secondary Diagnoses: ?Past Medical History:  ?Diagnosis Date  ? Allergy   ? Cancer [facility])   ? lung  ? Dyspnea   ? former smoker. only doe  ? History of kidney stones   ? Hypertension   ? Peripheral vascular disease ([place])   ? Pre-diabetes   ? Sleep apnea   ? ? ?Procedure(s): ?Lower Extremity Angiography ? ?Discharged Condition: good ? ?HPI:  ?Joshua [name] is a 66 year old man who presented to the hospital on 09/25/2021 for right lower extremity angiogram due to PAD with rest pain.  The patient required overnight thrombolytic therapy and was subsequently revascularized on 09/26/2021.  The patient has significant post procedure bruising with concern for retroperitoneal hematoma.  Recent CT scan shows that this is negative.  Overall patient is doing well and ready for discharge. ? ?Hospital Course:  ?[name] is a 66 y.o. male is S/P Right lower extremity angiogram ?Procedure(s): ?Lower Extremity Angiography ? ?Physical exam: Groin soft but with bruising ?Post-op wounds clean, dry, intact or healing well ?Pt. Ambulating, voiding and taking PO diet without difficulty. ?Pt pain controlled with PO pain meds. ?Labs as below ?Complications:none ? ?Consults:  ?Treatment Team:  ?Pccm, Ander Gaster, MD ? ?Significant Diagnostic Studies: ?CBC ?Lab Results  ?Component Value Date  ? WBC 5.2 09/27/2021  ? HGB 10.4 (L) 09/27/2021  ? HCT 32.2 (L) 09/27/2021  ? MCV 88.5 09/27/2021  ? PLT 120 (L) 09/27/2021  ? ? ?BMET ?   ?Component Value Date/Time  ? NA 139 09/27/2021 0404  ? NA 141 10/14/2019 1101  ? K 3.8 09/27/2021 0404  ? CL 105 09/27/2021 0404  ? CO2 28 09/27/2021  0404  ? GLUCOSE 113 (H) 09/27/2021 0404  ? BUN 13 09/27/2021 0404  ? BUN 15 10/14/2019 1101  ? BUN 16 07/03/2014 1205  ? CREATININE 1.04 09/27/2021 0404  ? CREATININE 1.34 (H) 07/03/2014 1205  ? CALCIUM 8.6 (L) 09/27/2021 0404  ? GFRNONAA >60 09/27/2021 0404  ? GFRNONAA 59 (L) 07/03/2014 1205  ? GFRAA >60 03/30/2020 0536  ? GFRAA >60 07/03/2014 1205  ? ?COAG ?Lab Results  ?Component Value Date  ? INR 1.2 09/26/2021  ? INR 1.2 08/25/2020  ? INR 1.0 03/05/2020  ? ? ? ?Disposition:  ?Discharge to :Home ? ?Allergies as of 09/27/2021   ? ?   Reactions  ? Tape   ? Honeycomb Dressing tape- burns his skin  ? ?  ? ?  ?Medication List  ?  ? ?TAKE these medications   ? ?acetaminophen 325 MG tablet ?Commonly known as: TYLENOL ?Take 650 mg by mouth every 6 (six) hours as needed for moderate pain. ?  ?clopidogrel 75 MG tablet ?Commonly known as: PLAVIX ?TAKE 1 TABLET BY MOUTH  DAILY ?  ?Eliquis 5 MG Tabs tablet ?Generic drug: apixaban ?TAKE 1 TABLET BY MOUTH  TWICE DAILY ?  ?HYDROcodone-acetaminophen 5-325 MG tablet ?Commonly known as: NORCO/VICODIN ?Take 1 tablet by mouth every 6 (six) hours as needed for moderate pain. ?  ?Klor-Con M20 20 MEQ tablet ?Generic drug: potassium chloride SA ?Take 1 tablet by mouth once daily ?  ?levothyroxine 88 MCG tablet ?Commonly known as: Synthroid ?Take  1 tablet (88 mcg total) by mouth daily before breakfast. ?  ?lidocaine-prilocaine cream ?Commonly known as: EMLA ?Apply to affected area once ?What changed:  ?how much to take ?how to take this ?when to take this ?reasons to take this ?additional instructions ?  ?simvastatin 40 MG tablet ?Commonly known as: ZOCOR ?Take 40 mg by mouth at bedtime. ?  ?Stiolto Respimat 2.5-2.5 MCG/ACT Aers ?Generic drug: Tiotropium Bromide-Olodaterol ?Inhale 2 puffs into the lungs daily. ?  ? ?  ? ?Verbal and written Discharge instructions given to the patient. Wound care per Discharge AVS ? Follow-up Information   ? ? Kris Hartmann, NP Follow up in 3 week(s).    ?Specialty: Vascular Surgery ?Why: ABI in 3 weeks with GS/FB ?Contact information: ?[address] ?[place] [address] ?[phone] ? ? ?  ?  ? ?  ?  ? ?  ? ? ?Signed: ?Kris Hartmann, NP ? ?09/27/2021, 10:01 AM ? ? ?  ? ?

## 2021-09-27 NOTE — Progress Notes (Signed)
NAME:  Joshua [name], MRN:  [id number], DOB:  03/10/1956, LOS: 2 ADMISSION DATE:  09/25/2021, CONSULTATION DATE:  09/25/21 REFERRING MD:  Dr Delana Meyer, CHIEF COMPLAINT:  Atherosclerosis of right lower extremity with rest pain   Brief Pt Description / Synopsis:  66 y.o. male with Atherosclerosis of the RLE with rest pain who underwent elective RLE angiogram on 3/8.  Requires continuous tPA infusion overnight with plan for repeat Angiogram on 3/9.  History of Present Illness:  65 year old male with significant past medical history of PVD, squamous cell carcinoma of the right lung, mediastinal adenopathy with hemoptysis, hypothyroidism, prediabetes, OSA, and hypertension who presented to the hospital for elective procedure.  -09/26/21- patient doing well, denies pain, shares he is irritated due to bells/whistles going off for now reason hindering his sleep. Eating well vital signs are stable 09/27/21- patient being optimized for downgrade from MICU  Pertinent  Medical History  Peripheral vascular disease Hypertension Lung cancer Obstructive sleep apnea Prediabetes  Consults:  PCCM  Procedures:  3/8: Lower Extremity Angiography (Right) as a surgical intervention  Significant Diagnostic Tests:  None  Micro Data:  3/8: SARS-CoV-2 PCR>  3/8: Influenza PCR>   Antimicrobials:  Cefazolin 3/8 x 2 doses  OBJECTIVE  Blood pressure 107/72, pulse 81, temperature 98.6 F (37 C), temperature source Oral, resp. rate 16, height 5\' 11"  (1.803 m), weight 91.3 kg, SpO2 97 %.        Intake/Output Summary (Last 24 hours) at 09/27/2021 1035 Last data filed at 09/27/2021 1020 Gross per 24 hour  Intake 240 ml  Output 1275 ml  Net -1035 ml    Filed Weights   09/25/21 1755 09/26/21 0709  Weight: 91.3 kg 91.3 kg   Physical Examination  GENERAL: 66 year-old critically ill patient lying in the bed with no acute distress.  EYES: Pupils equal, round, reactive to light and accommodation. No  scleral icterus. Extraocular muscles intact.  HEENT: Head atraumatic, normocephalic. Oropharynx and nasopharynx clear.  NECK:  Supple, no jugular venous distention. No thyroid enlargement, no tenderness.  LUNGS: Normal breath sounds bilaterally, no wheezing, rales,rhonchi or crepitation. No use of accessory muscles of respiration.  CARDIOVASCULAR: S1, S2 normal. No murmurs, rubs, or gallops.  ABDOMEN: Soft, nontender, nondistended. Bowel sounds present. No organomegaly or mass.  EXTREMITIES: No pedal edema, cyanosis, or clubbing.  Left groin site without hematoma or significant erythema. NEUROLOGIC: Cranial nerves II through XII are intact.  Muscle strength 5/5 in all extremities. Sensation decreased in the lower extremities. Gait not checked.  PSYCHIATRIC: The patient is alert and oriented x 3.  SKIN: No obvious rash, lesion, or ulcer.   Labs/imaging that I havepersonally reviewed  (right click and "Reselect all SmartList Selections" daily)     Labs   CBC: Recent Labs  Lab 09/25/21 1840 09/25/21 2333 09/26/21 0600 09/26/21 1740 09/27/21 0404  WBC 4.6 5.5 5.5 6.8 5.2  NEUTROABS  --   --   --  5.5 3.6  HGB 11.3* 11.4* 11.2* 12.2* 10.4*  HCT 34.9* 35.4* 34.6* 37.1* 32.2*  MCV 87.3 87.6 88.5 87.1 88.5  PLT 147* 133* 134* 141* 120*     Basic Metabolic Panel: Recent Labs  Lab 09/25/21 1134 09/26/21 0600 09/27/21 0404  NA  --  141 139  K  --  4.0 3.8  CL  --  104 105  CO2  --  27 28  GLUCOSE  --  105* 113*  BUN 21 15 13   CREATININE 1.23 1.07 1.04  CALCIUM  --  8.9 8.6*  MG  --   --  2.2  PHOS  --   --  4.1    GFR: Estimated Creatinine Clearance: 84 mL/min (by C-G formula based on SCr of 1.04 mg/dL). Recent Labs  Lab 09/25/21 2333 09/26/21 0600 09/26/21 1740 09/27/21 0404  WBC 5.5 5.5 6.8 5.2     Liver Function Tests: No results for input(s): AST, ALT, ALKPHOS, BILITOT, PROT, ALBUMIN in the last 168 hours. No results for input(s): LIPASE, AMYLASE in the last  168 hours. No results for input(s): AMMONIA in the last 168 hours.  ABG No results found for: PHART, PCO2ART, PO2ART, HCO3, TCO2, ACIDBASEDEF, O2SAT   Coagulation Profile: Recent Labs  Lab 09/26/21 1740  INR 1.2    Cardiac Enzymes: No results for input(s): CKTOTAL, CKMB, CKMBINDEX, TROPONINI in the last 168 hours.  HbA1C: Hgb A1c MFr Bld  Date/Time Value Ref Range Status  11/21/2020 11:50 AM 5.8 (H) 4.8 - 5.6 % Final    Comment:    (NOTE) Pre diabetes:          5.7%-6.4%  Diabetes:              >6.4%  Glycemic control for   <7.0% adults with diabetes   04/16/2020 03:25 PM 5.5 4.8 - 5.6 % Final    Comment:             Prediabetes: 5.7 - 6.4          Diabetes: >6.4          Glycemic control for adults with diabetes: <7.0     CBG: Recent Labs  Lab 09/25/21 1751 09/26/21 1940  GLUCAP 20 131*     Past Medical History  He,  has a past medical history of Allergy, Cancer ([place]), Dyspnea, History of kidney stones, Hypertension, Peripheral vascular disease ([place]), Pre-diabetes, and Sleep apnea.   Surgical History    Past Surgical History:  Procedure Laterality Date   ANTERIOR CERVICAL DECOMP/DISCECTOMY FUSION N/A 03/14/2020   Procedure: ANTERIOR CERVICAL DECOMPRESSION/DISCECTOMY FUSION 3 LEVELS C4-7;  Surgeon: Meade Maw, MD;  Location: [facility] ORS;  Service: Neurosurgery;  Laterality: N/A;   BACK SURGERY  2011   CENTRAL LINE INSERTION Right 09/10/2016   Procedure: CENTRAL LINE INSERTION;  Surgeon: Katha Cabal, MD;  Location: [facility];  Service: Vascular;  Laterality: Right;   CERVICAL FUSION     C4-c7 on Mar 14, 2020   COLONOSCOPY  2013 ?   COLONOSCOPY N/A 09/09/2021   Procedure: COLONOSCOPY;  Surgeon: [name], DO;  Location: [facility];  Service: Gastroenterology;  Laterality: N/A;   CORONARY ANGIOPLASTY     ESOPHAGOGASTRODUODENOSCOPY N/A 09/09/2021   Procedure: ESOPHAGOGASTRODUODENOSCOPY (EGD);  Surgeon: [name], DO;   Location: [facility];  Service: Gastroenterology;  Laterality: N/A;   FEMORAL-POPLITEAL BYPASS GRAFT Right 09/10/2016   Procedure: BYPASS GRAFT FEMORAL-POPLITEAL ARTERY ( BELOW KNEE );  Surgeon: Katha Cabal, MD;  Location: [facility];  Service: Vascular;  Laterality: Right;   FEMORAL-TIBIAL BYPASS GRAFT Right 03/23/2020   Procedure: BYPASS GRAFT RIGHT FEMORAL- DISTAL TIBIAL ARTERY WITH DISTAL FLOW GRAFT;  Surgeon: Katha Cabal, MD;  Location: [facility];  Service: Vascular;  Laterality: Right;   LEFT HEART CATH AND CORONARY ANGIOGRAPHY Left 05/16/2021   Procedure: LEFT HEART CATH AND CORONARY ANGIOGRAPHY;  Surgeon: Andrez Grime, MD;  Location: [place] [facility];  Service: Cardiovascular;  Laterality: Left;   LOWER EXTREMITY ANGIOGRAPHY Right 11/18/2016  Procedure: Lower Extremity Angiography;  Surgeon: Katha Cabal, MD;  Location: [place] [facility];  Service: Cardiovascular;  Laterality: Right;   LOWER EXTREMITY ANGIOGRAPHY Right 12/09/2016   Procedure: Lower Extremity Angiography;  Surgeon: Katha Cabal, MD;  Location: [place] [facility];  Service: Cardiovascular;  Laterality: Right;   LOWER EXTREMITY ANGIOGRAPHY Right 11/15/2019   Procedure: LOWER EXTREMITY ANGIOGRAPHY;  Surgeon: Katha Cabal, MD;  Location: [place] [facility];  Service: Cardiovascular;  Laterality: Right;   LOWER EXTREMITY ANGIOGRAPHY Right 12/01/2019   Procedure: Lower Extremity Angiography;  Surgeon: Algernon Huxley, MD;  Location: [place] [facility];  Service: Cardiovascular;  Laterality: Right;   LOWER EXTREMITY ANGIOGRAPHY Right 12/02/2019   Procedure: LOWER EXTREMITY ANGIOGRAPHY;  Surgeon: Katha Cabal, MD;  Location: [place] [facility];  Service: Cardiovascular;  Laterality: Right;   LOWER EXTREMITY ANGIOGRAPHY Right 03/23/2020   Procedure: Lower Extremity Angiography;  Surgeon: Katha Cabal, MD;  Location: [employer] [facility];  Service: Cardiovascular;  Laterality:  Right;   LOWER EXTREMITY ANGIOGRAPHY Right 09/25/2021   Procedure: Lower Extremity Angiography;  Surgeon: Katha Cabal, MD;  Location: [place] [facility];  Service: Cardiovascular;  Laterality: Right;   LOWER EXTREMITY ANGIOGRAPHY Right 09/26/2021   Procedure: Lower Extremity Angiography;  Surgeon: [place]non Huxley, MD;  Location: [place] [facility];  Service: Cardiovascular;  Laterality: Right;   LOWER EXTREMITY INTERVENTION  11/18/2016   Procedure: Lower Extremity Intervention;  Surgeon: Katha Cabal, MD;  Location: [place] [facility];  Service: Cardiovascular;;   PERIPHERAL VASCULAR CATHETERIZATION Right 01/02/2015   Procedure: Lower Extremity Angiography;  Surgeon: Katha Cabal, MD;  Location: [place] [facility];  Service: Cardiovascular;  Laterality: Right;   PERIPHERAL VASCULAR CATHETERIZATION Right 06/18/2015   Procedure: Lower Extremity Angiography;  Surgeon: Algernon Huxley, MD;  Location: [place] [facility];  Service: Cardiovascular;  Laterality: Right;   PERIPHERAL VASCULAR CATHETERIZATION  06/18/2015   Procedure: Lower Extremity Intervention;  Surgeon: Algernon Huxley, MD;  Location: [place] [facility];  Service: Cardiovascular;;   PERIPHERAL VASCULAR CATHETERIZATION N/A 10/09/2015   Procedure: Abdominal Aortogram w/Lower Extremity;  Surgeon: Katha Cabal, MD;  Location: [place] [facility];  Service: Cardiovascular;  Laterality: N/A;   PERIPHERAL VASCULAR CATHETERIZATION  10/09/2015   Procedure: Lower Extremity Intervention;  Surgeon: Katha Cabal, MD;  Location: [place] [facility];  Service: Cardiovascular;;   PERIPHERAL VASCULAR CATHETERIZATION Right 10/10/2015   Procedure: Lower Extremity Angiography;  Surgeon: Algernon Huxley, MD;  Location: [place] [facility];  Service: Cardiovascular;  Laterality: Right;   PERIPHERAL VASCULAR CATHETERIZATION Right 10/10/2015   Procedure: Lower Extremity Intervention;  Surgeon: Algernon Huxley, MD;  Location: [place] [facility];   Service: Cardiovascular;  Laterality: Right;   PERIPHERAL VASCULAR CATHETERIZATION Right 12/03/2015   Procedure: Lower Extremity Angiography;  Surgeon: Algernon Huxley, MD;  Location: [place] [facility];  Service: Cardiovascular;  Laterality: Right;   PERIPHERAL VASCULAR CATHETERIZATION Right 12/04/2015   Procedure: Lower Extremity Angiography;  Surgeon: Algernon Huxley, MD;  Location: [place] [facility];  Service: Cardiovascular;  Laterality: Right;   PERIPHERAL VASCULAR CATHETERIZATION  12/04/2015   Procedure: Lower Extremity Intervention;  Surgeon: Algernon Huxley, MD;  Location: [place] [facility];  Service: Cardiovascular;;   PERIPHERAL VASCULAR CATHETERIZATION Right 06/30/2016   Procedure: Lower Extremity Angiography;  Surgeon: Algernon Huxley, MD;  Location: [place] [facility];  Service: Cardiovascular;  Laterality: Right;   PERIPHERAL VASCULAR CATHETERIZATION Right 06/25/2016   Procedure: Lower Extremity Angiography;  Surgeon: Katha Cabal, MD;  Location: [place] [facility];  Service: Cardiovascular;  Laterality: Right;   PERIPHERAL VASCULAR CATHETERIZATION Right 07/01/2016   Procedure: Lower Extremity Angiography;  Surgeon: Katha Cabal, MD;  Location: [place] [facility];  Service: Cardiovascular;  Laterality: Right;   PERIPHERAL VASCULAR CATHETERIZATION  07/01/2016   Procedure: Lower Extremity Intervention;  Surgeon: Katha Cabal, MD;  Location: [place] [facility];  Service: Cardiovascular;;   PERIPHERAL VASCULAR CATHETERIZATION Right 08/01/2016   Procedure: Lower Extremity Angiography;  Surgeon: Katha Cabal, MD;  Location: [place] [facility];  Service: Cardiovascular;  Laterality: Right;   PORTA CATH INSERTION N/A 10/05/2020   Procedure: PORTA CATH INSERTION;  Surgeon: Katha Cabal, MD;  Location: [place] [facility];  Service: Cardiovascular;  Laterality: N/A;   stent placement in right leg Right    VIDEO BRONCHOSCOPY N/A 09/14/2020   Procedure: VIDEO  BRONCHOSCOPY WITH FLUORO;  Surgeon: Tyler Pita, MD;  Location: [facility];  Service: Cardiopulmonary;  Laterality: N/A;   VIDEO BRONCHOSCOPY WITH ENDOBRONCHIAL ULTRASOUND N/A 09/14/2020   Procedure: VIDEO BRONCHOSCOPY WITH ENDOBRONCHIAL ULTRASOUND;  Surgeon: Tyler Pita, MD;  Location: [facility];  Service: Cardiopulmonary;  Laterality: N/A;     Social History   reports that he quit smoking about 6 years ago. His smoking use included cigarettes. He has a 76.00 pack-year smoking history. He has never used smokeless tobacco. He reports that he does not currently use alcohol. He reports that he does not use drugs.   Family History   His family history includes Diabetes in his mother; Heart murmur in his mother; Hypertension in his mother; Leukemia in his mother; Throat cancer in his maternal grandmother. There is no history of Colon cancer or Breast cancer.   Allergies Allergies  Allergen Reactions   Tape     Honeycomb Dressing tape- burns his skin      Home Medications  Prior to Admission medications   Medication Sig Start Date End Date Taking? Authorizing Provider  acetaminophen (TYLENOL) 325 MG tablet Take 650 mg by mouth every 6 (six) hours as needed for moderate pain.   Yes [provider]  clopidogrel (PLAVIX) 75 MG tablet TAKE 1 TABLET BY MOUTH  DAILY 03/01/21  Yes Schnier, Dolores Lory, MD  ELIQUIS 5 MG TABS tablet TAKE 1 TABLET BY MOUTH  TWICE DAILY 08/12/21  Yes Kris Hartmann, NP  KLOR-CON M20 20 MEQ tablet Take 1 tablet by mouth once daily 09/24/21  Yes [name], MD  levothyroxine (SYNTHROID) 88 MCG tablet Take 1 tablet (88 mcg total) by mouth daily before breakfast. 09/13/21  Yes [name], MD  lidocaine-prilocaine (EMLA) cream Apply to affected area once Patient taking differently: Apply 1 application topically daily as needed (port access). 10/01/20  Yes [name], MD  simvastatin (ZOCOR) 40 MG tablet Take 40 mg by mouth at bedtime. 02/28/21  Yes [provider]  Tiotropium Bromide-Olodaterol (STIOLTO RESPIMAT) 2.5-2.5 MCG/ACT AERS Inhale 2 puffs into the lungs daily. 09/04/21  Yes Tyler Pita, MD  Scheduled Meds:  arformoterol  15 mcg Nebulization BID   And   umeclidinium bromide  1 puff Inhalation Daily   Chlorhexidine Gluconate Cloth  6 each Topical Q0600   levothyroxine  88 mcg Oral QAC breakfast   metoprolol tartrate  5 mg Intravenous Q6H   sodium chloride flush  3 mL  Intravenous Q12H   Continuous Infusions:  sodium chloride     PRN Meds:.sodium chloride, hydrALAZINE, HYDROmorphone (DILAUDID) injection, HYDROmorphone (DILAUDID) injection, labetalol, midazolam, morphine injection, ondansetron (ZOFRAN) IV, ondansetron (ZOFRAN) IV, ondansetron (ZOFRAN) IV, sodium chloride flush  Active Hospital Problem list     Assessment & Plan:  RLE Atherosclerosis  with rest pain S/p elective RLE angiogram on 3/8. 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 -As needed labetalol and hydralazine  Pre-Diabetes  -CBGs -Sliding scale insulin -Follow ICU hyper/hypoglycemia protocol  Hypothyroidism -Continue Synthroid  Squamous cell carcinoma of right lung, stage IIIA JK:DTOI, OSA, former smoker, Radiation pneumonititis -Supplemental O2 as needed to maintain O2 saturations 88 to 92% -s/p radiation and chemo per oncology -Follows with Dr. Tasia Catchings -continue bronchodilators, follows with Dr. Patsey Berthold pulmonologist   Best practice:  Diet:  NPO Pain/Anxiety/Delirium protocol (if indicated): No VAP protocol (if indicated): Not indicated DVT prophylaxis: Systemic AC GI prophylaxis: N/A Glucose control:  SSI Yes Central venous  access:  N/A Arterial line:  N/A Foley:  N/A Mobility:  bed rest  PT consulted: N/A Last date of multidisciplinary goals of care discussion [09/25/2020] Code Status:  full code Disposition: ICU   = Goals of Care = Code Status Order: FULL  Primary Emergency Contact: Cates,Joise, Home Phone: [phone] Wishes to pursue full aggressive treatment and intervention options, including CPR and intubation, but goals of care will be addressed on going with family if that should become necessary.    Ottie Glazier, M.D.  Pulmonary & Critical Care Medicine

## 2021-10-02 ENCOUNTER — Ambulatory Visit
Admission: RE | Admit: 2021-10-02 | Discharge: 2021-10-02 | Disposition: A | Discharge status: Home / Self Care | Payer: Medicare Other | Source: Ambulatory Visit | Attending: Neurosurgery | Admitting: Neurosurgery

## 2021-10-02 ENCOUNTER — Other Ambulatory Visit: Payer: Self-pay

## 2021-10-02 DIAGNOSIS — G959 Disease of spinal cord, unspecified: Secondary | ICD-10-CM | POA: Insufficient documentation

## 2021-10-02 DIAGNOSIS — M542 Cervicalgia: Secondary | ICD-10-CM | POA: Insufficient documentation

## 2021-10-02 DIAGNOSIS — Z981 Arthrodesis status: Secondary | ICD-10-CM | POA: Insufficient documentation

## 2021-10-07 ENCOUNTER — Inpatient Hospital Stay
Admission: EM | Admit: 2021-10-07 | Discharge: 2021-10-09 | DRG: 054 | Disposition: A | Discharge status: Home Health | Payer: Medicare Other | Attending: Internal Medicine | Admitting: Internal Medicine

## 2021-10-07 ENCOUNTER — Emergency Department: Payer: Medicare Other

## 2021-10-07 ENCOUNTER — Other Ambulatory Visit: Payer: Self-pay

## 2021-10-07 DIAGNOSIS — Z833 Family history of diabetes mellitus: Secondary | ICD-10-CM

## 2021-10-07 DIAGNOSIS — G936 Cerebral edema: Secondary | ICD-10-CM | POA: Diagnosis present

## 2021-10-07 DIAGNOSIS — Z7902 Long term (current) use of antithrombotics/antiplatelets: Secondary | ICD-10-CM

## 2021-10-07 DIAGNOSIS — I739 Peripheral vascular disease, unspecified: Secondary | ICD-10-CM | POA: Diagnosis present

## 2021-10-07 DIAGNOSIS — Z9109 Other allergy status, other than to drugs and biological substances: Secondary | ICD-10-CM

## 2021-10-07 DIAGNOSIS — Z9861 Coronary angioplasty status: Secondary | ICD-10-CM

## 2021-10-07 DIAGNOSIS — R471 Dysarthria and anarthria: Secondary | ICD-10-CM | POA: Diagnosis present

## 2021-10-07 DIAGNOSIS — C3491 Malignant neoplasm of unspecified part of right bronchus or lung: Secondary | ICD-10-CM | POA: Diagnosis present

## 2021-10-07 DIAGNOSIS — R739 Hyperglycemia, unspecified: Secondary | ICD-10-CM | POA: Diagnosis not present

## 2021-10-07 DIAGNOSIS — Z86718 Personal history of other venous thrombosis and embolism: Secondary | ICD-10-CM

## 2021-10-07 DIAGNOSIS — Z87891 Personal history of nicotine dependence: Secondary | ICD-10-CM

## 2021-10-07 DIAGNOSIS — E663 Overweight: Secondary | ICD-10-CM | POA: Diagnosis present

## 2021-10-07 DIAGNOSIS — R1310 Dysphagia, unspecified: Secondary | ICD-10-CM | POA: Diagnosis present

## 2021-10-07 DIAGNOSIS — E032 Hypothyroidism due to medicaments and other exogenous substances: Secondary | ICD-10-CM | POA: Diagnosis present

## 2021-10-07 DIAGNOSIS — R519 Headache, unspecified: Secondary | ICD-10-CM | POA: Diagnosis present

## 2021-10-07 DIAGNOSIS — E039 Hypothyroidism, unspecified: Secondary | ICD-10-CM | POA: Diagnosis present

## 2021-10-07 DIAGNOSIS — M542 Cervicalgia: Secondary | ICD-10-CM | POA: Diagnosis not present

## 2021-10-07 DIAGNOSIS — Z20822 Contact with and (suspected) exposure to covid-19: Secondary | ICD-10-CM | POA: Diagnosis present

## 2021-10-07 DIAGNOSIS — R29898 Other symptoms and signs involving the musculoskeletal system: Secondary | ICD-10-CM | POA: Diagnosis present

## 2021-10-07 DIAGNOSIS — R4701 Aphasia: Secondary | ICD-10-CM | POA: Diagnosis present

## 2021-10-07 DIAGNOSIS — G4730 Sleep apnea, unspecified: Secondary | ICD-10-CM | POA: Diagnosis present

## 2021-10-07 DIAGNOSIS — T380X5A Adverse effect of glucocorticoids and synthetic analogues, initial encounter: Secondary | ICD-10-CM | POA: Diagnosis not present

## 2021-10-07 DIAGNOSIS — I10 Essential (primary) hypertension: Secondary | ICD-10-CM | POA: Diagnosis present

## 2021-10-07 DIAGNOSIS — C7931 Secondary malignant neoplasm of brain: Principal | ICD-10-CM | POA: Diagnosis present

## 2021-10-07 DIAGNOSIS — Z6827 Body mass index (BMI) 27.0-27.9, adult: Secondary | ICD-10-CM

## 2021-10-07 DIAGNOSIS — Z8249 Family history of ischemic heart disease and other diseases of the circulatory system: Secondary | ICD-10-CM

## 2021-10-07 DIAGNOSIS — R531 Weakness: Secondary | ICD-10-CM | POA: Diagnosis present

## 2021-10-07 DIAGNOSIS — D509 Iron deficiency anemia, unspecified: Secondary | ICD-10-CM | POA: Diagnosis present

## 2021-10-07 DIAGNOSIS — E785 Hyperlipidemia, unspecified: Secondary | ICD-10-CM | POA: Diagnosis present

## 2021-10-07 DIAGNOSIS — Z79899 Other long term (current) drug therapy: Secondary | ICD-10-CM

## 2021-10-07 DIAGNOSIS — R7309 Other abnormal glucose: Secondary | ICD-10-CM | POA: Diagnosis present

## 2021-10-07 DIAGNOSIS — Z79890 Hormone replacement therapy (postmenopausal): Secondary | ICD-10-CM

## 2021-10-07 LAB — COMPREHENSIVE METABOLIC PANEL
ALT: 13 U/L (ref 0–44)
AST: 25 U/L (ref 15–41)
Albumin: 4.4 g/dL (ref 3.5–5.0)
Alkaline Phosphatase: 66 U/L (ref 38–126)
Anion gap: 8 (ref 5–15)
BUN: 16 mg/dL (ref 8–23)
CO2: 27 mmol/L (ref 22–32)
Calcium: 9.1 mg/dL (ref 8.9–10.3)
Chloride: 105 mmol/L (ref 98–111)
Creatinine, Ser: 1.17 mg/dL (ref 0.61–1.24)
GFR, Estimated: >60 mL/min (ref >60)
Glucose, Bld: 108 mg/dL — ABNORMAL HIGH (ref 70–99)
Potassium: 3.8 mmol/L (ref 3.5–5.1)
Sodium: 140 mmol/L (ref 135–145)
Total Bilirubin: 0.6 mg/dL (ref 0.3–1.2)
Total Protein: 7.8 g/dL (ref 6.5–8.1)

## 2021-10-07 LAB — CBC WITH DIFFERENTIAL/PLATELET
Abs Immature Granulocytes: 0.01 10*3/uL (ref 0.00–0.07)
Basophils Absolute: 0.1 10*3/uL (ref 0.0–0.1)
Basophils Relative: 1 %
Eosinophils Absolute: 0.2 10*3/uL (ref 0.0–0.5)
Eosinophils Relative: 4 %
HCT: 37.3 % — ABNORMAL LOW (ref 39.0–52.0)
Hemoglobin: 11.8 g/dL — ABNORMAL LOW (ref 13.0–17.0)
Immature Granulocytes: 0 %
Lymphocytes Relative: 18 %
Lymphs Abs: 0.9 10*3/uL (ref 0.7–4.0)
MCH: 28.5 pg (ref 26.0–34.0)
MCHC: 31.6 g/dL (ref 30.0–36.0)
MCV: 90.1 fL (ref 80.0–100.0)
Monocytes Absolute: 0.5 10*3/uL (ref 0.1–1.0)
Monocytes Relative: 9 %
Neutro Abs: 3.4 10*3/uL (ref 1.7–7.7)
Neutrophils Relative %: 68 %
Platelets: 190 10*3/uL (ref 150–400)
RBC: 4.14 MIL/uL — ABNORMAL LOW (ref 4.22–5.81)
RDW: 14.5 % (ref 11.5–15.5)
WBC: 5 10*3/uL (ref 4.0–10.5)
nRBC: 0 % (ref 0.0–0.2)

## 2021-10-07 LAB — RESP PANEL BY RT-PCR (FLU A&B, COVID) ARPGX2
Influenza A by PCR: NEGATIVE
Influenza B by PCR: NEGATIVE
SARS Coronavirus 2 by RT PCR: NEGATIVE

## 2021-10-07 MED ORDER — GADOBUTROL 1 MMOL/ML IV SOLN
9.0000 mL | Freq: Once | INTRAVENOUS | Status: AC | PRN
  Administered 2021-10-07: 9 mL via INTRAVENOUS

## 2021-10-07 MED ORDER — ACETAMINOPHEN 650 MG RE SUPP: 650.0000 mg | Freq: Four times a day (QID) | RECTAL | Status: DC | PRN

## 2021-10-07 MED ORDER — ACETAMINOPHEN 325 MG PO TABS: 650.0000 mg | ORAL_TABLET | Freq: Four times a day (QID) | ORAL | Status: DC | PRN

## 2021-10-07 MED ORDER — DEXAMETHASONE SODIUM PHOSPHATE 10 MG/ML IJ SOLN
10.0000 mg | Freq: Once | INTRAMUSCULAR | Status: AC
  Administered 2021-10-07: 10 mg via INTRAVENOUS
  Filled 2021-10-07: qty 1

## 2021-10-07 MED ORDER — HYDRALAZINE HCL 20 MG/ML IJ SOLN: 10.0000 mg | Freq: Four times a day (QID) | INTRAMUSCULAR | Status: DC | PRN

## 2021-10-07 MED ORDER — APIXABAN 5 MG PO TABS
5.0000 mg | ORAL_TABLET | Freq: Two times a day (BID) | ORAL | Status: DC
Start: 2021-10-08 — End: 2021-10-09
  Administered 2021-10-08 – 2021-10-09 (×3): 5 mg via ORAL
  Filled 2021-10-07 (×3): qty 1

## 2021-10-07 MED ORDER — HYDROCODONE-ACETAMINOPHEN 5-325 MG PO TABS: 1.0000 | ORAL_TABLET | Freq: Four times a day (QID) | ORAL | Status: DC | PRN

## 2021-10-07 MED ORDER — CLOPIDOGREL BISULFATE 75 MG PO TABS
75.0000 mg | ORAL_TABLET | Freq: Every day | ORAL | Status: DC
  Administered 2021-10-08 – 2021-10-09 (×2): 75 mg via ORAL
  Filled 2021-10-07 (×2): qty 1

## 2021-10-07 MED ORDER — LEVOTHYROXINE SODIUM 88 MCG PO TABS
88.0000 ug | ORAL_TABLET | Freq: Every day | ORAL | Status: DC
  Administered 2021-10-08 – 2021-10-09 (×2): 88 ug via ORAL
  Filled 2021-10-07 (×2): qty 1

## 2021-10-07 MED ORDER — DEXAMETHASONE SODIUM PHOSPHATE 4 MG/ML IJ SOLN
4.0000 mg | Freq: Four times a day (QID) | INTRAMUSCULAR | Status: DC
  Administered 2021-10-08 – 2021-10-09 (×6): 4 mg via INTRAVENOUS
  Filled 2021-10-07 (×6): qty 1

## 2021-10-07 MED ORDER — SIMVASTATIN 20 MG PO TABS
40.0000 mg | ORAL_TABLET | Freq: Every day | ORAL | Status: DC
  Administered 2021-10-08 (×2): 40 mg via ORAL
  Filled 2021-10-07: qty 2
  Filled 2021-10-07: qty 4

## 2021-10-07 MED ORDER — SODIUM CHLORIDE 0.9 % IV SOLN: INTRAVENOUS | Status: AC

## 2021-10-07 NOTE — Assessment & Plan Note (Deleted)
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 ?Per edmd case d/w neurosurgery and to start with decadron 10 mg and then  4 mg q6. ?

## 2021-10-07 NOTE — Assessment & Plan Note (Addendum)
Secondary to brain mets.  Mild improvement on Decadron.  PT and OT recommending home health, which she declined. ?

## 2021-10-07 NOTE — Assessment & Plan Note (Deleted)
Blood pressure (!) 141/79, pulse 74, temperature 97.7 ?F (36.5 ?C), temperature source Oral, resp. rate 13, height 5\' 11"  (1.803 m), weight 90.7 kg, SpO2 98 %. ?cont prn hydralazine.  ?

## 2021-10-07 NOTE — ED Triage Notes (Signed)
Patient to ER via POV with complaints of neck pain and difficulty communicating. Reports symptoms started after he had a colonoscopy in February. Patient with some left sided deficits, states he had issues grabbing items but then has difficulty picking said items up.  ? ?Patient states he had an MRI of his neck and head 2 days ago that showed a mass of on his brain. States he think this might be the course of his pain and headaches  ? ?Grip strength equal bilaterally.  ?

## 2021-10-07 NOTE — ED Provider Notes (Signed)
? ?[facility] ?Provider Note ? ? ? Event Date/Time  ? First MD Initiated Contact with Patient 10/07/21 1729   ?  (approximate) ? ? ?History  ? ?Torticollis ? ? ?HPI ? ?Joshua [name] is a 66 y.o. male   with past medical history of prior cervical myelopathy s/p C4-C7 ACDF peripheral vascular disease, history of right-sided lung cancer status postchemotherapy and radiation who presents with aphasia/dysarthria.  Patient notes that since he had a colonoscopy in February he has had progressive difficulty using his right arm as well as difficulty with speech.  Occasionally has difficulty swallowing as well, although this has been going on for longer.  Denies visual change or symptoms in his lower extremity.  Is currently undergoing chemotherapy for lung cancer. ? ?  ? ?Past Medical History:  ?Diagnosis Date  ? Allergy   ? Cancer [facility])   ? lung  ? Dyspnea   ? former smoker. only doe  ? History of kidney stones   ? Hypertension   ? Peripheral vascular disease ([place])   ? Pre-diabetes   ? Sleep apnea   ? ? ?Patient Active Problem List  ? Diagnosis Date Noted  ? Headache 10/07/2021  ? Right hand weakness 10/07/2021  ? Parotid nodule 08/16/2021  ? Dysphagia 06/20/2021  ? COVID 06/20/2021  ? Organizing pneumonia ([place]) 04/04/2021  ? Hypothyroidism due to medication 04/04/2021  ? Encounter for antineoplastic immunotherapy 04/04/2021  ? Cancer ([place]) 01/14/2021  ? Antineoplastic chemotherapy induced anemia 10/17/2020  ? 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 Goals of care, counseling/discussion 08/30/2020  ? Iron deficiency anemia 08/30/2020  ? Lung mass 08/30/2020  ? Ischemia of extremity 03/23/2020  ? Cervical myelopathy ([place]) 03/14/2020  ? Ischemia of right lower extremity 11/15/2019  ? Peripheral neuropathy 10/14/2019  ? Chronic left-sided low back pain without sciatica 04/13/2019  ? History of back surgery 04/13/2019  ? Overweight 04/13/2019  ? Atherosclerosis of lower extremity  with claudication ([place]) 11/18/2016  ? Complication associated with vascular device 11/13/2016  ? Hyperlipidemia 06/18/2016  ? Hypertension 06/18/2016  ? Radiculopathy of lumbar region 06/10/2016  ? PVD (peripheral vascular disease) ([place]) 05/08/2016  ? 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[place]) 01/02/2015  ? Prediabetes 08/25/2014  ? ? ? ?Physical Exam  ?Triage Vital Signs: ?ED Triage Vitals  ?Enc Vitals Group  ?   BP 10/07/21 1706 121/77  ?   Pulse Rate 10/07/21 1706 90  ?   Resp 10/07/21 1706 15  ?   Temp 10/07/21 1706 97.7 ?F (36.5 ?C)  ?   Temp Source 10/07/21 1706 Oral  ?   SpO2 10/07/21 1706 100 %  ?   Weight 10/07/21 1706 200 lb (90.7 kg)  ?   Height 10/07/21 1706 $RemoveBefor'5\' 11"'FygxMOTYeFWf$  (1.803 m)  ?   Head Circumference --   ?   Peak Flow --   ?   Pain Score 10/07/21 1717 0  ?   Pain Loc --   ?   Pain Edu? --   ?   Excl. in [place]? --   ? ? ?Most recent vital signs: ?Vitals:  ? 10/07/21 2030 10/07/21 2230  ?BP: 129/84 (!) 141/79  ?Pulse: 67 74  ?Resp: 12 13  ?Temp:    ?SpO2: 100% 98%  ? ? ? ?General: Awake, no distress.  ?CV:  Good peripheral perfusion.  ?Resp:  Normal effort.  ?  Abd:  No distention.  ?Neuro:             Awake, Alert, Oriented x 3  ?Other:  Aox3, dysarthric ?PERRL, EOMI, face symmetric,  ?Tongue deviates toward the R  ?R palatal rise asymmetric  ?R sided pronator drift  ?Sensation grossly intact in the BL upper and lower extremities  ?Finger-nose-finger intact BL ? ? ? ?ED Results / Procedures / Treatments  ?Labs ?(all labs ordered are listed, but only abnormal results are displayed) ?Labs Reviewed  ?CBC WITH DIFFERENTIAL/PLATELET - Abnormal; Notable for the following components:  ?    Result Value  ? RBC 4.14 (*)   ? Hemoglobin 11.8 (*)   ? HCT 37.3 (*)   ? All other components within normal limits  ?COMPREHENSIVE METABOLIC PANEL - Abnormal; Notable for the following components:  ? Glucose, Bld 108 (*)   ? All other components  within normal limits  ?RESP PANEL BY RT-PCR (FLU A&B, COVID) ARPGX2  ?CBC  ?COMPREHENSIVE METABOLIC PANEL  ?HIV ANTIBODY (ROUTINE TESTING W REFLEX)  ? ? ? ?EKG ? ?EKG interpretation performed by myself: NSR, nml axis, nml intervals, no acute ischemic changes ? ? ? ?RADIOLOGY ? ?Reviewed the patient's MRI which shows a metastatic lesion with a large amount of vasogenic edema in the left frontal lobe ? ?PROCEDURES: ? ?Critical Care performed: Yes, see critical care procedure note(s) ? ?.1-3 Lead EKG Interpretation ?Performed by: Rada Hay, MD ?Authorized by: Rada Hay, MD  ? ?  Interpretation: normal   ?  ECG rate assessment: normal   ?  Ectopy: none   ?  Conduction: normal   ?.Critical Care ?Performed by: Rada Hay, MD ?Authorized by: Rada Hay, MD  ? ?Critical care provider statement:  ?  Critical care time (minutes):  30 ?  Critical care was time spent personally by me on the following activities:  Development of treatment plan with patient or surrogate, discussions with consultants, evaluation of patient's response to treatment, examination of patient, ordering and review of laboratory studies, ordering and review of radiographic studies, ordering and performing treatments and interventions, pulse oximetry, re-evaluation of patient's condition and review of old charts ? ?The patient is on the cardiac monitor to evaluate for evidence of arrhythmia and/or significant heart rate changes. ? ? ?MEDICATIONS ORDERED IN ED: ?Medications  ?HYDROcodone-acetaminophen (NORCO/VICODIN) 5-325 MG per tablet 1 tablet (has no administration in time range)  ?simvastatin (ZOCOR) tablet 40 mg (has no administration in time range)  ?levothyroxine (SYNTHROID) tablet 88 mcg (has no administration in time range)  ?clopidogrel (PLAVIX) tablet 75 mg (has no administration in time range)  ?apixaban (ELIQUIS) tablet 5 mg (has no administration in time range)  ?0.9 %  sodium chloride infusion (has no  administration in time range)  ?acetaminophen (TYLENOL) tablet 650 mg (has no administration in time range)  ?  Or  ?acetaminophen (TYLENOL) suppository 650 mg (has no administration in time range)  ?hydrALAZINE (APRESOLINE) injection 10 mg (has no administration in time range)  ?dexamethasone (DECADRON) injection 4 mg (has no administration in time range)  ?gadobutrol (GADAVIST) 1 MMOL/ML injection 9 mL (9 mLs Intravenous Contrast Given 10/07/21 2136)  ?dexamethasone (DECADRON) injection 10 mg (10 mg Intravenous Given 10/07/21 2223)  ? ? ? ?IMPRESSION / MDM / ASSESSMENT AND PLAN / ED COURSE  ?I reviewed the triage vital signs and the nursing notes. ?             ?               ? ?  Differential diagnosis includes, but is not limited to, CVA, brain met ? ?Patient is a 66 year old male past medical history of lung cancer and prior cervical myelopathy status post repair who presents with progressive right-sided arm weakness and dysarthria since having a colonoscopy back in February.  On exam patient is obviously dysarthric he has a symmetric palatal rise with tongue deviation to the right and pronator drift on the right.  I am certainly concerned given his history of cancer for potential brain mets versus CVA.  Did have a cervical MRI recently given concern for recurrent cervical myelopathy with his arm symptoms which showed some potential concerning lesion in the cerebellum.  Right brain with and without contrast today shows 2 likely metastatic lesions the one in the left frontal lobe causing a significant amount of edema with midline shift.  Discussed with Dr. Cari Caraway recommends Decadron and admission for further work-up.  Discussed the findings with the patient as well.  The patient's labs including CMP and CMP which are otherwise within normal limits. ? ?  ? ? ?FINAL CLINICAL IMPRESSION(S) / ED DIAGNOSES  ? ?Final diagnoses:  ?Metastatic cancer to brain [facility])  ? ? ? ?Rx / DC Orders  ? ?ED Discharge Orders   ? ?  None  ? ?  ? ? ? ?Note:  This document was prepared using Dragon voice recognition software and may include unintentional dictation errors. ?  ?Rada Hay, MD ?10/07/21 2333 ? ?

## 2021-10-07 NOTE — ED Notes (Signed)
No complaints of pain. Lunch box and beverage provided to patient. Dr. Starleen Blue at bedside updating patient.  ?

## 2021-10-07 NOTE — Assessment & Plan Note (Addendum)
As per vascular surgery to try to continue anticoagulation.  Patient on Eliquis and Plavix.  Patient is at high risk for occlusive event of anticoagulation were to be stopped further surgery.  Fortunately, able to to continue on his Eliquis and Plavix and not have to stop for stereotactic radiosurgery. ?

## 2021-10-07 NOTE — H&P (Addendum)
?History and Physical  ? ? ?Patient: Joshua [name] XVQ:[id number] DOB: 02-05-1956 ?DOA: 10/07/2021 ?DOS: the patient was seen and examined on 10/07/2021 ?PCP: [name], MD  ?Patient coming from: Home ? ?Chief Complaint:  ?Chief Complaint  ?Patient presents with  ? Torticollis  ? ?HPI: Joshua [name] is a 66 y.o. male with medical history significant of lung cancer, history of tobacco abuse, PAD and critical limb ischemia, hypertension, sleep apnea presenting with neck pain. ?Pt is alert and awake and oriented and come to [place] with c/o headache on left side and right upper extremity weakness x all since feb 20th.  ?The rue has been constant but headache has been intermittent.  ?Pt says he has cancer in his lung and sees dr Tasia Catchings.  ?Pt no longer smokes since 2016.  ?Pt is aware of scan results and it most likely being mets from lung.  ?Pt is recently s/p t/t for critical limb ischemia and DAPT with plan for ABI in 3 weeks with GS/FB per vascular note.  ? ? ?Review of Systems:  ?Review of Systems  ?Constitutional: Negative.   ?HENT: Negative.    ?Eyes: Negative.   ?Respiratory: Negative.    ?Cardiovascular: Negative.   ?Gastrointestinal: Negative.   ?Genitourinary: Negative.   ?Musculoskeletal: Negative.   ?Skin: Negative.   ?Neurological:  Positive for sensory change, focal weakness and headaches.  ?All other systems reviewed and are negative. ? ?Past Medical History:  ?Diagnosis Date  ? Allergy   ? Cancer [facility])   ? lung  ? Dyspnea   ? former smoker. only doe  ? History of kidney stones   ? Hypertension   ? Peripheral vascular disease ([place])   ? Pre-diabetes   ? Sleep apnea   ? ?Past Surgical History:  ?Procedure Laterality Date  ? ANTERIOR CERVICAL DECOMP/DISCECTOMY FUSION N/A 03/14/2020  ? Procedure: ANTERIOR CERVICAL DECOMPRESSION/DISCECTOMY FUSION 3 LEVELS C4-7;  Surgeon: Meade Maw, MD;  Location: [facility] ORS;  Service: Neurosurgery;  Laterality: N/A;  ? BACK SURGERY  2011  ? CENTRAL LINE  INSERTION Right 09/10/2016  ? Procedure: CENTRAL LINE INSERTION;  Surgeon: Delana Meyer Dolores Lory, MD;  Location: [facility] ORS;  Service: Vascular;  Laterality: Right;  ? CERVICAL FUSION    ? C4-c7 on Mar 14, 2020  ? COLONOSCOPY  2013 ?  ? COLONOSCOPY N/A 09/09/2021  ? Procedure: COLONOSCOPY;  Surgeon: [name], DO;  Location: [facility] ENDOSCOPY;  Service: Gastroenterology;  Laterality: N/A;  ? CORONARY ANGIOPLASTY    ? ESOPHAGOGASTRODUODENOSCOPY N/A 09/09/2021  ? Procedure: ESOPHAGOGASTRODUODENOSCOPY (EGD);  Surgeon: [name], DO;  Location: [facility] ENDOSCOPY;  Service: Gastroenterology;  Laterality: N/A;  ? FEMORAL-POPLITEAL BYPASS GRAFT Right 09/10/2016  ? Procedure: BYPASS GRAFT FEMORAL-POPLITEAL ARTERY ( BELOW KNEE );  Surgeon: Katha Cabal, MD;  Location: [facility] ORS;  Service: Vascular;  Laterality: Right;  ? FEMORAL-TIBIAL BYPASS GRAFT Right 03/23/2020  ? Procedure: BYPASS GRAFT RIGHT FEMORAL- DISTAL TIBIAL ARTERY WITH DISTAL FLOW GRAFT;  Surgeon: Katha Cabal, MD;  Location: [facility] ORS;  Service: Vascular;  Laterality: Right;  ? LEFT HEART CATH AND CORONARY ANGIOGRAPHY Left 05/16/2021  ? Procedure: LEFT HEART CATH AND CORONARY ANGIOGRAPHY;  Surgeon: Andrez Grime, MD;  Location: [place] CV LAB;  Service: Cardiovascular;  Laterality: Left;  ? LOWER EXTREMITY ANGIOGRAPHY Right 11/18/2016  ? Procedure: Lower Extremity Angiography;  Surgeon: Katha Cabal, MD;  Location: [place] CV LAB;  Service: Cardiovascular;  Laterality: Right;  ? LOWER EXTREMITY ANGIOGRAPHY Right  12/09/2016  ? Procedure: Lower Extremity Angiography;  Surgeon: Katha Cabal, MD;  Location: [place] CV LAB;  Service: Cardiovascular;  Laterality: Right;  ? LOWER EXTREMITY ANGIOGRAPHY Right 11/15/2019  ? Procedure: LOWER EXTREMITY ANGIOGRAPHY;  Surgeon: Katha Cabal, MD;  Location: [place] CV LAB;  Service: Cardiovascular;  Laterality: Right;  ? LOWER EXTREMITY ANGIOGRAPHY Right 12/01/2019  ?  Procedure: Lower Extremity Angiography;  Surgeon: Algernon Huxley, MD;  Location: [place] CV LAB;  Service: Cardiovascular;  Laterality: Right;  ? LOWER EXTREMITY ANGIOGRAPHY Right 12/02/2019  ? Procedure: LOWER EXTREMITY ANGIOGRAPHY;  Surgeon: Katha Cabal, MD;  Location: [place] CV LAB;  Service: Cardiovascular;  Laterality: Right;  ? LOWER EXTREMITY ANGIOGRAPHY Right 03/23/2020  ? Procedure: Lower Extremity Angiography;  Surgeon: Katha Cabal, MD;  Location: [place] CV LAB;  Service: Cardiovascular;  Laterality: Right;  ? LOWER EXTREMITY ANGIOGRAPHY Right 09/25/2021  ? Procedure: Lower Extremity Angiography;  Surgeon: Katha Cabal, MD;  Location: [place] CV LAB;  Service: Cardiovascular;  Laterality: Right;  ? LOWER EXTREMITY ANGIOGRAPHY Right 09/26/2021  ? Procedure: Lower Extremity Angiography;  Surgeon: Algernon Huxley, MD;  Location: [place] CV LAB;  Service: Cardiovascular;  Laterality: Right;  ? LOWER EXTREMITY INTERVENTION  11/18/2016  ? Procedure: Lower Extremity Intervention;  Surgeon: Katha Cabal, MD;  Location: [place] CV LAB;  Service: Cardiovascular;;  ? PERIPHERAL VASCULAR CATHETERIZATION Right 01/02/2015  ? Procedure: Lower Extremity Angiography;  Surgeon: Katha Cabal, MD;  Location: [place] CV LAB;  Service: Cardiovascular;  Laterality: Right;  ? PERIPHERAL VASCULAR CATHETERIZATION Right 06/18/2015  ? Procedure: Lower Extremity Angiography;  Surgeon: Algernon Huxley, MD;  Location: [place] CV LAB;  Service: Cardiovascular;  Laterality: Right;  ? PERIPHERAL VASCULAR CATHETERIZATION  06/18/2015  ? Procedure: Lower Extremity Intervention;  Surgeon: Algernon Huxley, MD;  Location: [place] CV LAB;  Service: Cardiovascular;;  ? PERIPHERAL VASCULAR CATHETERIZATION N/A 10/09/2015  ? Procedure: Abdominal Aortogram w/Lower Extremity;  Surgeon: Katha Cabal, MD;  Location: [place] CV LAB;  Service: Cardiovascular;  Laterality: N/A;  ?  PERIPHERAL VASCULAR CATHETERIZATION  10/09/2015  ? Procedure: Lower Extremity Intervention;  Surgeon: Katha Cabal, MD;  Location: [place] CV LAB;  Service: Cardiovascular;;  ? PERIPHERAL VASCULAR CATHETERIZATION Right 10/10/2015  ? Procedure: Lower Extremity Angiography;  Surgeon: Algernon Huxley, MD;  Location: [place] CV LAB;  Service: Cardiovascular;  Laterality: Right;  ? PERIPHERAL VASCULAR CATHETERIZATION Right 10/10/2015  ? Procedure: Lower Extremity Intervention;  Surgeon: Algernon Huxley, MD;  Location: [place] CV LAB;  Service: Cardiovascular;  Laterality: Right;  ? PERIPHERAL VASCULAR CATHETERIZATION Right 12/03/2015  ? Procedure: Lower Extremity Angiography;  Surgeon: Algernon Huxley, MD;  Location: [place] CV LAB;  Service: Cardiovascular;  Laterality: Right;  ? PERIPHERAL VASCULAR CATHETERIZATION Right 12/04/2015  ? Procedure: Lower Extremity Angiography;  Surgeon: Algernon Huxley, MD;  Location: [place] CV LAB;  Service: Cardiovascular;  Laterality: Right;  ? PERIPHERAL VASCULAR CATHETERIZATION  12/04/2015  ? Procedure: Lower Extremity Intervention;  Surgeon: Algernon Huxley, MD;  Location: [place] CV LAB;  Service: Cardiovascular;;  ? PERIPHERAL VASCULAR CATHETERIZATION Right 06/30/2016  ? Procedure: Lower Extremity Angiography;  Surgeon: Algernon Huxley, MD;  Location: [place] CV LAB;  Service: Cardiovascular;  Laterality: Right;  ? PERIPHERAL VASCULAR CATHETERIZATION Right 06/25/2016  ? Procedure: Lower Extremity Angiography;  Surgeon: Katha Cabal, MD;  Location: [place] CV LAB;  Service: Cardiovascular;  Laterality: Right;  ? PERIPHERAL VASCULAR CATHETERIZATION Right 07/01/2016  ? Procedure: Lower Extremity Angiography;  Surgeon: Katha Cabal, MD;  Location: [place] CV LAB;  Service: Cardiovascular;  Laterality: Right;  ? PERIPHERAL VASCULAR CATHETERIZATION  07/01/2016  ? Procedure: Lower Extremity Intervention;  Surgeon: Katha Cabal, MD;  Location:  [place] CV LAB;  Service: Cardiovascular;;  ? PERIPHERAL VASCULAR CATHETERIZATION Right 08/01/2016  ? Procedure: Lower Extremity Angiography;  Surgeon: Katha Cabal, MD;  Location: [place] CV LAB

## 2021-10-07 NOTE — ED Notes (Signed)
RN attempting IV access without success ?

## 2021-10-08 DIAGNOSIS — E785 Hyperlipidemia, unspecified: Secondary | ICD-10-CM | POA: Diagnosis present

## 2021-10-08 DIAGNOSIS — R29898 Other symptoms and signs involving the musculoskeletal system: Secondary | ICD-10-CM | POA: Diagnosis not present

## 2021-10-08 DIAGNOSIS — C3491 Malignant neoplasm of unspecified part of right bronchus or lung: Secondary | ICD-10-CM

## 2021-10-08 DIAGNOSIS — I739 Peripheral vascular disease, unspecified: Secondary | ICD-10-CM | POA: Diagnosis present

## 2021-10-08 DIAGNOSIS — M542 Cervicalgia: Secondary | ICD-10-CM | POA: Diagnosis present

## 2021-10-08 DIAGNOSIS — Z9861 Coronary angioplasty status: Secondary | ICD-10-CM | POA: Diagnosis not present

## 2021-10-08 DIAGNOSIS — D509 Iron deficiency anemia, unspecified: Secondary | ICD-10-CM | POA: Diagnosis present

## 2021-10-08 DIAGNOSIS — Z86718 Personal history of other venous thrombosis and embolism: Secondary | ICD-10-CM | POA: Diagnosis not present

## 2021-10-08 DIAGNOSIS — Z9109 Other allergy status, other than to drugs and biological substances: Secondary | ICD-10-CM | POA: Diagnosis not present

## 2021-10-08 DIAGNOSIS — Z20822 Contact with and (suspected) exposure to covid-19: Secondary | ICD-10-CM | POA: Diagnosis present

## 2021-10-08 DIAGNOSIS — T380X5A Adverse effect of glucocorticoids and synthetic analogues, initial encounter: Secondary | ICD-10-CM | POA: Diagnosis not present

## 2021-10-08 DIAGNOSIS — Z8249 Family history of ischemic heart disease and other diseases of the circulatory system: Secondary | ICD-10-CM | POA: Diagnosis not present

## 2021-10-08 DIAGNOSIS — C7931 Secondary malignant neoplasm of brain: Secondary | ICD-10-CM | POA: Diagnosis present

## 2021-10-08 DIAGNOSIS — R739 Hyperglycemia, unspecified: Secondary | ICD-10-CM | POA: Diagnosis not present

## 2021-10-08 DIAGNOSIS — Z87891 Personal history of nicotine dependence: Secondary | ICD-10-CM | POA: Diagnosis not present

## 2021-10-08 DIAGNOSIS — R7309 Other abnormal glucose: Secondary | ICD-10-CM | POA: Diagnosis present

## 2021-10-08 DIAGNOSIS — R471 Dysarthria and anarthria: Secondary | ICD-10-CM | POA: Diagnosis present

## 2021-10-08 DIAGNOSIS — E032 Hypothyroidism due to medicaments and other exogenous substances: Secondary | ICD-10-CM

## 2021-10-08 DIAGNOSIS — Z6827 Body mass index (BMI) 27.0-27.9, adult: Secondary | ICD-10-CM | POA: Diagnosis not present

## 2021-10-08 DIAGNOSIS — Z833 Family history of diabetes mellitus: Secondary | ICD-10-CM | POA: Diagnosis not present

## 2021-10-08 DIAGNOSIS — R4701 Aphasia: Secondary | ICD-10-CM | POA: Diagnosis present

## 2021-10-08 DIAGNOSIS — I10 Essential (primary) hypertension: Secondary | ICD-10-CM | POA: Diagnosis present

## 2021-10-08 DIAGNOSIS — E039 Hypothyroidism, unspecified: Secondary | ICD-10-CM | POA: Diagnosis present

## 2021-10-08 DIAGNOSIS — G936 Cerebral edema: Secondary | ICD-10-CM | POA: Diagnosis present

## 2021-10-08 DIAGNOSIS — R531 Weakness: Secondary | ICD-10-CM | POA: Diagnosis present

## 2021-10-08 DIAGNOSIS — G4730 Sleep apnea, unspecified: Secondary | ICD-10-CM | POA: Diagnosis present

## 2021-10-08 DIAGNOSIS — E782 Mixed hyperlipidemia: Secondary | ICD-10-CM

## 2021-10-08 DIAGNOSIS — R1310 Dysphagia, unspecified: Secondary | ICD-10-CM | POA: Diagnosis present

## 2021-10-08 DIAGNOSIS — E663 Overweight: Secondary | ICD-10-CM | POA: Diagnosis present

## 2021-10-08 LAB — CBC
HCT: 34.3 % — ABNORMAL LOW (ref 39.0–52.0)
Hemoglobin: 11.1 g/dL — ABNORMAL LOW (ref 13.0–17.0)
MCH: 29 pg (ref 26.0–34.0)
MCHC: 32.4 g/dL (ref 30.0–36.0)
MCV: 89.6 fL (ref 80.0–100.0)
Platelets: 183 10*3/uL (ref 150–400)
RBC: 3.83 MIL/uL — ABNORMAL LOW (ref 4.22–5.81)
RDW: 14.5 % (ref 11.5–15.5)
WBC: 7.4 10*3/uL (ref 4.0–10.5)
nRBC: 0 % (ref 0.0–0.2)

## 2021-10-08 LAB — COMPREHENSIVE METABOLIC PANEL
ALT: 15 U/L (ref 0–44)
AST: 28 U/L (ref 15–41)
Albumin: 3.9 g/dL (ref 3.5–5.0)
Alkaline Phosphatase: 62 U/L (ref 38–126)
Anion gap: 7 (ref 5–15)
BUN: 15 mg/dL (ref 8–23)
CO2: 26 mmol/L (ref 22–32)
Calcium: 9.1 mg/dL (ref 8.9–10.3)
Chloride: 105 mmol/L (ref 98–111)
Creatinine, Ser: 1.13 mg/dL (ref 0.61–1.24)
GFR, Estimated: >60 mL/min (ref >60)
Glucose, Bld: 200 mg/dL — ABNORMAL HIGH (ref 70–99)
Potassium: 3.7 mmol/L (ref 3.5–5.1)
Sodium: 138 mmol/L (ref 135–145)
Total Bilirubin: 0.7 mg/dL (ref 0.3–1.2)
Total Protein: 7.2 g/dL (ref 6.5–8.1)

## 2021-10-08 LAB — HIV ANTIBODY (ROUTINE TESTING W REFLEX): HIV Screen 4th Generation wRfx: NONREACTIVE

## 2021-10-08 LAB — GLUCOSE, CAPILLARY
Glucose-Capillary: 198 mg/dL — ABNORMAL HIGH (ref 70–99)
Glucose-Capillary: 171 mg/dL — ABNORMAL HIGH (ref 70–99)

## 2021-10-08 MED ORDER — INSULIN ASPART 100 UNIT/ML IJ SOLN
0.0000 [IU] | Freq: Three times a day (TID) | INTRAMUSCULAR | Status: DC
  Administered 2021-10-08: 1 [IU] via SUBCUTANEOUS
  Administered 2021-10-09: 2 [IU] via SUBCUTANEOUS
  Filled 2021-10-08 (×2): qty 1

## 2021-10-08 MED ORDER — INSULIN ASPART 100 UNIT/ML IJ SOLN: 0.0000 [IU] | Freq: Every day | INTRAMUSCULAR | Status: DC

## 2021-10-08 MED ORDER — LEVETIRACETAM 500 MG PO TABS
500.0000 mg | ORAL_TABLET | Freq: Two times a day (BID) | ORAL | Status: DC
  Administered 2021-10-08 – 2021-10-09 (×4): 500 mg via ORAL
  Filled 2021-10-08 (×4): qty 1
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
## 2021-10-08 NOTE — Assessment & Plan Note (Signed)
Ferritin back in 2022 low at 25 going along with iron deficiency anemia.  Last hemoglobin 11.1 ?

## 2021-10-08 NOTE — ED Notes (Signed)
Informed RN bed assigned 

## 2021-10-08 NOTE — Assessment & Plan Note (Addendum)
Patient started on IV Decadron.  MRI of the brain shows 2 contrast-enhancing lesions most consistent with metastatic disease the largest lesion in left frontal lobe measuring up to 1.3 cm with a large amount of surrounding edema and rightward midline shift.  See below.  Radiation oncology felt patient would be a good candidate for stereotactic focal radiation.  Needs thin slice MRI and simulation prior to starting.  This can be done as outpatient.  Patient will follow-up with radiation oncology.  Discharged on empiric Keppra and p.o. Decadron.  Patient seen by physical and Occupational Therapy who recommended home health which patient declined ?

## 2021-10-08 NOTE — Consult Note (Addendum)
? ?Referring Physician:  ?No referring provider defined for this encounter. ? ?Primary Physician:  ?[name], MD ? ?Chief Complaint:  abnormal speech, headache ? ?History of Present Illness: ?10/08/2021 ?Joshua [name] is a 66 y.o. male who presents with the chief complaint of headache on the L side as well as weakness.  He has had some RUE weakness for the past month.  His headaches have been intermittent but are out of character for him.  He noted some difficulty with speech, in particular getting out his words.  He knows what he wants to say (and write), but has had difficulty executing speech. ? ?He has no other complaints at this time.  Steroids have helped his speech. ? ?The symptoms are causing a significant impact on the patient's life.  ? ?Review of Systems:  ?A 10 point review of systems is negative, except for the pertinent positives and negatives detailed in the HPI. ? ?Past Medical History: ?Past Medical History:  ?Diagnosis Date  ? Allergy   ? Cancer [facility])   ? lung  ? Dyspnea   ? former smoker. only doe  ? History of kidney stones   ? Hypertension   ? Peripheral vascular disease ([place])   ? Pre-diabetes   ? Sleep apnea   ? ? ?Past Surgical History: ?Past Surgical History:  ?Procedure Laterality Date  ? ANTERIOR CERVICAL DECOMP/DISCECTOMY FUSION N/A 03/14/2020  ? Procedure: ANTERIOR CERVICAL DECOMPRESSION/DISCECTOMY FUSION 3 LEVELS C4-7;  Surgeon: Meade Maw, MD;  Location: [facility] ORS;  Service: Neurosurgery;  Laterality: N/A;  ? BACK SURGERY  2011  ? CENTRAL LINE INSERTION Right 09/10/2016  ? Procedure: CENTRAL LINE INSERTION;  Surgeon: Delana Meyer Dolores Lory, MD;  Location: [facility] ORS;  Service: Vascular;  Laterality: Right;  ? CERVICAL FUSION    ? C4-c7 on Mar 14, 2020  ? COLONOSCOPY  2013 ?  ? COLONOSCOPY N/A 09/09/2021  ? Procedure: COLONOSCOPY;  Surgeon: [name], DO;  Location: [facility] ENDOSCOPY;  Service: Gastroenterology;  Laterality: N/A;  ? CORONARY ANGIOPLASTY    ?  ESOPHAGOGASTRODUODENOSCOPY N/A 09/09/2021  ? Procedure: ESOPHAGOGASTRODUODENOSCOPY (EGD);  Surgeon: [name], DO;  Location: [facility] ENDOSCOPY;  Service: Gastroenterology;  Laterality: N/A;  ? FEMORAL-POPLITEAL BYPASS GRAFT Right 09/10/2016  ? Procedure: BYPASS GRAFT FEMORAL-POPLITEAL ARTERY ( BELOW KNEE );  Surgeon: Katha Cabal, MD;  Location: [facility] ORS;  Service: Vascular;  Laterality: Right;  ? FEMORAL-TIBIAL BYPASS GRAFT Right 03/23/2020  ? Procedure: BYPASS GRAFT RIGHT FEMORAL- DISTAL TIBIAL ARTERY WITH DISTAL FLOW GRAFT;  Surgeon: Katha Cabal, MD;  Location: [facility] ORS;  Service: Vascular;  Laterality: Right;  ? LEFT HEART CATH AND CORONARY ANGIOGRAPHY Left 05/16/2021  ? Procedure: LEFT HEART CATH AND CORONARY ANGIOGRAPHY;  Surgeon: Andrez Grime, MD;  Location: [place] [facility];  Service: Cardiovascular;  Laterality: Left;  ? LOWER EXTREMITY ANGIOGRAPHY Right 11/18/2016  ? Procedure: Lower Extremity Angiography;  Surgeon: Katha Cabal, MD;  Location: [place] [facility];  Service: Cardiovascular;  Laterality: Right;  ? LOWER EXTREMITY ANGIOGRAPHY Right 12/09/2016  ? Procedure: Lower Extremity Angiography;  Surgeon: Katha Cabal, MD;  Location: [place] [facility];  Service: Cardiovascular;  Laterality: Right;  ? LOWER EXTREMITY ANGIOGRAPHY Right 11/15/2019  ? Procedure: LOWER EXTREMITY ANGIOGRAPHY;  Surgeon: Katha Cabal, MD;  Location: [place] [facility];  Service: Cardiovascular;  Laterality: Right;  ? LOWER EXTREMITY ANGIOGRAPHY Right 12/01/2019  ? Procedure: Lower Extremity Angiography;  Surgeon: Algernon Huxley, MD;  Location:  [place] [facility];  Service: Cardiovascular;  Laterality: Right;  ? LOWER EXTREMITY ANGIOGRAPHY Right 12/02/2019  ? Procedure: LOWER EXTREMITY ANGIOGRAPHY;  Surgeon: Katha Cabal, MD;  Location: [place] [facility];  Service: Cardiovascular;  Laterality: Right;  ? LOWER EXTREMITY ANGIOGRAPHY Right 03/23/2020  ? Procedure: Lower  Extremity Angiography;  Surgeon: Katha Cabal, MD;  Location: [place] [facility];  Service: Cardiovascular;  Laterality: Right;  ? LOWER EXTREMITY ANGIOGRAPHY Right 09/25/2021  ? Procedure: Lower Extremity Angiography;  Surgeon: Katha Cabal, MD;  Location: [place] [facility];  Service: Cardiovascular;  Laterality: Right;  ? LOWER EXTREMITY ANGIOGRAPHY Right 09/26/2021  ? Procedure: Lower Extremity Angiography;  Surgeon: Algernon Huxley, MD;  Location: [place] [facility];  Service: Cardiovascular;  Laterality: Right;  ? LOWER EXTREMITY INTERVENTION  11/18/2016  ? Procedure: Lower Extremity Intervention;  Surgeon: Katha Cabal, MD;  Location: [place] [facility];  Service: Cardiovascular;;  ? PERIPHERAL VASCULAR CATHETERIZATION Right 01/02/2015  ? Procedure: Lower Extremity Angiography;  Surgeon: Katha Cabal, MD;  Location: [place] [facility];  Service: Cardiovascular;  Laterality: Right;  ? PERIPHERAL VASCULAR CATHETERIZATION Right 06/18/2015  ? Procedure: Lower Extremity Angiography;  Surgeon: Algernon Huxley, MD;  Location: [place] [facility];  Service: Cardiovascular;  Laterality: Right;  ? PERIPHERAL VASCULAR CATHETERIZATION  06/18/2015  ? Procedure: Lower Extremity Intervention;  Surgeon: Algernon Huxley, MD;  Location: [place] [facility];  Service: Cardiovascular;;  ? PERIPHERAL VASCULAR CATHETERIZATION N/A 10/09/2015  ? Procedure: Abdominal Aortogram w/Lower Extremity;  Surgeon: Katha Cabal, MD;  Location: [place] [facility];  Service: Cardiovascular;  Laterality: N/A;  ? PERIPHERAL VASCULAR CATHETERIZATION  10/09/2015  ? Procedure: Lower Extremity Intervention;  Surgeon: Katha Cabal, MD;  Location: [place] [facility];  Service: Cardiovascular;;  ? PERIPHERAL VASCULAR CATHETERIZATION Right 10/10/2015  ? Procedure: Lower Extremity Angiography;  Surgeon: Algernon Huxley, MD;  Location: [place] [facility];  Service: Cardiovascular;  Laterality: Right;  ? PERIPHERAL VASCULAR  CATHETERIZATION Right 10/10/2015  ? Procedure: Lower Extremity Intervention;  Surgeon: Algernon Huxley, MD;  Location: [place] [facility];  Service: Cardiovascular;  Laterality: Right;  ? PERIPHERAL VASCULAR CATHETERIZATION Right 12/03/2015  ? Procedure: Lower Extremity Angiography;  Surgeon: Algernon Huxley, MD;  Location: [place] [facility];  Service: Cardiovascular;  Laterality: Right;  ? PERIPHERAL VASCULAR CATHETERIZATION Right 12/04/2015  ? Procedure: Lower Extremity Angiography;  Surgeon: Algernon Huxley, MD;  Location: [place] [facility];  Service: Cardiovascular;  Laterality: Right;  ? PERIPHERAL VASCULAR CATHETERIZATION  12/04/2015  ? Procedure: Lower Extremity Intervention;  Surgeon: Algernon Huxley, MD;  Location: [place] [facility];  Service: Cardiovascular;;  ? PERIPHERAL VASCULAR CATHETERIZATION Right 06/30/2016  ? Procedure: Lower Extremity Angiography;  Surgeon: Algernon Huxley, MD;  Location: [place] [facility];  Service: Cardiovascular;  Laterality: Right;  ? PERIPHERAL VASCULAR CATHETERIZATION Right 06/25/2016  ? Procedure: Lower Extremity Angiography;  Surgeon: Katha Cabal, MD;  Location: [place] [facility];  Service: Cardiovascular;  Laterality: Right;  ? PERIPHERAL VASCULAR CATHETERIZATION Right 07/01/2016  ? Procedure: Lower Extremity Angiography;  Surgeon: Katha Cabal, MD;  Location: [place] [facility];  Service: Cardiovascular;  Laterality: Right;  ? PERIPHERAL VASCULAR CATHETERIZATION  07/01/2016  ? Procedure: Lower Extremity Intervention;  Surgeon: Katha Cabal, MD;  Location: [place] [facility];  Service: Cardiovascular;;  ? PERIPHERAL VASCULAR CATHETERIZATION Right 08/01/2016  ? Procedure: Lower Extremity Angiography;  Surgeon: Katha Cabal, MD;  Location: [place] [facility];  Service: Cardiovascular;  Laterality: Right;  ? PORTA CATH INSERTION N/A 10/05/2020  ? Procedure: PORTA CATH INSERTION;  Surgeon: Katha Cabal, MD;  Location: [place] [facility];  Service:  Cardiovascular;  Laterality: N/A;  ? stent placement in right leg Right   ? VIDEO BRONCHOSCOPY N/A 09/14/2020  ? Procedure: VIDEO BRONCHOSCOPY WITH FLUORO;  Surgeon: Tyler Pita, MD;  Location: [facility] ORS;  Service: Ernst Spell

## 2021-10-08 NOTE — Evaluation (Signed)
Physical Therapy Evaluation Patient Details Name: Joshua [name] MRN: [id number] DOB: Jul 31, 1955 Today's Date: 10/08/2021  History of Present Illness  Pt is 66 y/o male with c/o headache and weakness. MRI with results of two contrast enhancing lesions, most consistent with metastatic  disease. The largest lesion is in the left frontal lobe and measures  up to 1.3 cm with a large amount of surrounding edema and 9 mm  rightward midline shift.  Clinical Impression  Pt pleasant and eager to work with PT (and prove that he can manage and be safe) and though he did show some limited safety and deficit awareness he was able to circumambulate the nurses' station (~100 ft w/o AD) with good, if somewhat misplaced, confidence.  He did show consistent issues with R UE coordination having difficulty donning mask on R ear, unknowingly getting R thumb repeatedly caught in the telemetry wires, struggling with donning holding menu/sock/etc.  Pt acknowledges that he has had progressive issues with R UE (eg. driving, food prep) and this PT did discuss this with family who assures me they can give some consistent assist.  Did discuss the reality that he may be needing more assist moving forward and both pt and family did recognize that this could be the case and needs to be considered.  Pt confident that he can go home, but aware that PT has some concerns about his safety and deficit awareness.     Recommendations for follow up therapy are one component of a multi-disciplinary discharge planning process, led by the attending physician.  Recommendations may be updated based on patient status, additional functional criteria and insurance authorization.  Follow Up Recommendations Home health PT    Assistance Recommended at Discharge Intermittent Supervision/Assistance (spoke with family who will provide at least weekly check-ins and help with errands/driving)  Patient can return home with the following  Assist for  transportation;Assistance with cooking/housework    Equipment Recommendations None recommended by PT  Recommendations for Other Services       Functional Status Assessment Patient has had a recent decline in their functional status and demonstrates the ability to make significant improvements in function in a reasonable and predictable amount of time.     Precautions / Restrictions Precautions Precautions: Fall Restrictions Weight Bearing Restrictions: No      Mobility  Bed Mobility Overal bed mobility: Needs Assistance Bed Mobility: Supine to Sit, Sit to Supine     Supine to sit: Min guard Sit to supine: Min guard   General bed mobility comments: Pt able to to use gross motor power moves to to get/from supine    Transfers Overall transfer level: Needs assistance Equipment used: Rolling walker (2 wheels) Transfers: Sit to/from Stand Sit to Stand: Min assist           General transfer comment: Pt was able to sit to stand multiple times t/o session with some minimal low grade unsteadiness but no LOBs or real overt safety concerns    Ambulation/Gait Ambulation/Gait assistance: Supervision Gait Distance (Feet): 200 Feet Assistive device: Rolling walker (2 wheels)         General Gait Details: Pt was able to ambulate ~75 ft with walker, 25 ft with single hand rail and ~100 ft with no UE assist (R UE pulling IV but not WBing).  Pt with R limp (that he reports as baseline) that does not cause overt safety issues.  Stairs            Psychologist, prison and probation services  Modified Rankin (Stroke Patients Only)       Balance Overall balance assessment: Needs assistance Sitting-balance support: Feet supported Sitting balance-Leahy Scale: Fair Sitting balance - Comments: posterior lean while seated attempting to don B socks, did successfully pull them on slowly, but w/o assist   Standing balance support: During functional activity Standing balance-Leahy Scale:  Fair Standing balance comment: Pt with a limp and minimal unsteadiness but did not have overt LOBs with despite some impulsivity                             Pertinent Vitals/Pain Pain Assessment Pain Assessment: No/denies pain    Home Living Family/patient expects to be discharged to:: Private residence Living Arrangements: Alone Available Help at Discharge: Family;Friend(s);Available PRN/intermittently Type of Home: House Home Access: Ramped entrance       Home Layout: One level Home Equipment: Agricultural consultant (2 wheels);Rollator (4 wheels);Cane - single point;Shower seat;BSC/3in1;Wheelchair - manual Additional Comments: Pt reports most of equipment belonged to his mother who has passed.    Prior Function Prior Level of Function : Independent/Modified Independent;Driving             Mobility Comments: Pt reports he does not use any AD at baseline. In the last week he is using RW or SPC for mobility. Does endores 1-2 falls in the last week. ADLs Comments: Pt reports independence with self care tasks and IADLs at baseline, however progressive R UE weakness/coordination issues have caused issues with meal prep, driving, etc     Hand Dominance   Dominant Hand: Right    Extremity/Trunk Assessment   Upper Extremity Assessment Upper Extremity Assessment: RUE deficits/detail RUE Deficits / Details: decreased coordination,dexterity, and speed with movement. Full AROM with strength grossly 3+/5.    Lower Extremity Assessment Lower Extremity Assessment: Overall WFL for tasks assessed       Communication   Communication: No difficulties  Cognition Arousal/Alertness: Awake/alert Behavior During Therapy: Impulsive Overall Cognitive Status: No family/caregiver present to determine baseline cognitive functioning                                 General Comments: Pt with limited insight to deficits and safety awareness. Pt is agreeable and cooperative.  He follows 1 step commands.        General Comments General comments (skin integrity, edema, etc.): Pt pleasant, motivated, consistently showed minimal impulsivity with most functional tasks    Exercises     Assessment/Plan    PT Assessment Patient needs continued PT services  PT Problem List Decreased strength;Decreased balance;Decreased mobility;Decreased coordination;Decreased cognition;Decreased knowledge of use of DME;Decreased safety awareness       PT Treatment Interventions DME instruction;Gait training;Functional mobility training;Therapeutic activities;Therapeutic exercise;Balance training;Neuromuscular re-education;Cognitive remediation;Patient/family education    PT Goals (Current goals can be found in the Care Plan section)  Acute Rehab PT Goals Patient Stated Goal: go home PT Goal Formulation: With patient Time For Goal Achievement: 10/22/21 Potential to Achieve Goals: Good    Frequency Min 2X/week     Co-evaluation               AM-PAC PT "6 Clicks" Mobility  Outcome Measure Help needed turning from your back to your side while in a flat bed without using bedrails?: None Help needed moving from lying on your back to sitting on the side of a flat bed without using  bedrails?: None Help needed moving to and from a bed to a chair (including a wheelchair)?: A Little Help needed standing up from a chair using your arms (e.g., wheelchair or bedside chair)?: A Little Help needed to walk in hospital room?: A Little Help needed climbing 3-5 steps with a railing? : A Lot 6 Click Score: 19    End of Session Equipment Utilized During Treatment: Gait belt Activity Tolerance: Patient tolerated treatment well Patient left: with bed alarm set;with call bell/phone within reach;with nursing/sitter in room   PT Visit Diagnosis: Unsteadiness on feet (R26.81);Muscle weakness (generalized) (M62.81);Other symptoms and signs involving the nervous system (R29.898)     Time: 4098-1191 PT Time Calculation (min) (ACUTE ONLY): 26 min   Charges:   PT Evaluation $PT Eval Low Complexity: 1 Low PT Treatments $Gait Training: 8-22 mins        Malachi Pro, DPT 10/08/2021, 3:51 PM

## 2021-10-08 NOTE — Assessment & Plan Note (Addendum)
With metastatic brain lesions now has stage IV lung cancer.  Oncology discussed with neurosurgery and radiation oncology with plan for stereotactic radiosurgery as patient's best option.  We will also need outpatient PET scan. ?

## 2021-10-08 NOTE — Assessment & Plan Note (Signed)
Continue levothyroxine 

## 2021-10-08 NOTE — Care Management (Signed)
Patient lives at home alone  ? ?Joshua [name] Daughter     [phone]  ? ?

## 2021-10-08 NOTE — H&P (Signed)
Addendum to A/P ?Pt was seen by vascular for critics ?Limb ischemia and the fore continued in DAPT ?Marland Kitchen ?

## 2021-10-08 NOTE — ED Notes (Signed)
Neuro MD at bedside with pt at this time.  ?

## 2021-10-08 NOTE — Assessment & Plan Note (Addendum)
Secondary to high-dose steroids.  A1c at 5.5.  On sliding scale. ?

## 2021-10-08 NOTE — Progress Notes (Signed)
Admission profile updated. ?

## 2021-10-08 NOTE — Assessment & Plan Note (Signed)
Continue Zocor 

## 2021-10-08 NOTE — Progress Notes (Addendum)
?Progress Note ? ? ?Patient: Joshua [name] ZJQ:[id number] DOB: 1955/11/23 DOA: 10/07/2021     0 ?DOS: the patient was seen and examined on 10/08/2021 ?  ?Brief hospital course: ?66 year old man with past medical history of lung cancer, hypertension, peripheral vascular disease and sleep apnea.  He presents to the hospital with slurred speech, right arm incoordination and unsteady gait.  He was found to have brain metastases on MRI of the brain with edema.  He was started on IV Decadron. ? ?Assessment and Plan: ?* Brain metastases ([place]) ?Patient started on IV Decadron.  MRI of the brain shows 2 contrast-enhancing lesions most consistent with metastatic disease the largest lesion in left frontal lobe measuring up to 1.3 cm with a large amount of surrounding edema and rightward midline shift.  Case discussed with neurosurgery and oncology.  Neurosurgery and radiation oncology will set up a treatment plan.  Empiric Keppra. ? 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  Dr. Tasia Catchings to see in consultation. ? ?Right hand weakness ?Secondary to brain mets.  PT and OT consultations. ? ?PVD (peripheral vascular disease) ([place]) ?As per vascular surgery to try to continue anticoagulation.  Patient on Eliquis and Plavix. ? ?Iron deficiency anemia ?Ferritin back in 2022 low at 25 going along with iron deficiency anemia.  Last hemoglobin 11.1 ? ?Elevated glucose ?Likely secondary to high-dose steroids.  We will put on sliding scale for right now and check a hemoglobin A1c. ? ?Hypothyroidism, unspecified ?Continue levothyroxine ? ?Hyperlipidemia, unspecified ?Continue Zocor ? ? ? ? ?  ? ?Subjective: Patient coming in with right hand incoordination and dropping things and slurred speech.  Also having some difficulty walking.  Found to have brain metastases on MRI of the brain.  History of lung cancer. ? ?Physical Exam: ?Vitals:  ? 10/08/21 0630 10/08/21 0700 10/08/21 0906  10/08/21 1138  ?BP: 103/67 105/65 124/70 126/75  ?Pulse: 83 81 84 87  ?Resp: 14 15 18 17   ?Temp:   98 ?F (36.7 ?C) 97.7 ?F (36.5 ?C)  ?TempSrc:   Oral   ?SpO2: 97% 98% 100% 100%  ?Weight:      ?Height:      ? ?Physical Exam ?HENT:  ?   Head: Normocephalic.  ?   Mouth/Throat:  ?   Pharynx: No oropharyngeal exudate.  ?Eyes:  ?   General: Lids are normal.  ?   Conjunctiva/sclera: Conjunctivae normal.  ?Cardiovascular:  ?   Rate and Rhythm: Normal rate and regular rhythm.  ?   Heart sounds: Normal heart sounds, S1 normal and S2 normal.  ?Pulmonary:  ?   Breath sounds: No decreased breath sounds, wheezing, rhonchi or rales.  ?Abdominal:  ?   Palpations: Abdomen is soft.  ?   Tenderness: There is no abdominal tenderness.  ?Musculoskeletal:  ?   Right lower leg: No swelling.  ?   Left lower leg: No swelling.  ?Skin: ?   General: Skin is warm.  ?   Findings: No rash.  ?Neurological:  ?   Mental Status: He is alert.  ?   Comments: Intermittent slurred speech.  Unable to do rapid finger movements right hand.  Arm power is 5 out of 5.  ?  ?Data Reviewed: ?Laboratory and radiological data reviewed ? ?Family Communication: Spoke with patient's aunt on the phone ? ?Disposition: ?Status is: Inpatient ?Remains inpatient appropriate because: Need to set up a treatment plan for brain metastases.  On high-dose  steroids. ? ?Planned Discharge Destination: Rehab.  The patient lives home alone. ? ? ?Author: ?Loletha Grayer, MD ?10/08/2021 2:07 PM ? ?For on call review [email or web address].  ?

## 2021-10-08 NOTE — Consult Note (Signed)
? ?Hematology/Oncology Consult note ?[phone] [phone] ? ?  ? ? ?Patient Care Team: ?[name], MD as PCP - General (Physician Assistant) ?[name], MD as Referring Physician (Physician Assistant) ?Robert Bellow, MD (General Surgery) ?Telford Nab, RN as Sales executive  ? ?Name of the patient: Joshua [name]  ?[id number]  ?1955-08-02  ? ?Date of visit: 10/08/21 ?REASON FOR COSULTATION:  ?Metastatic brain lesion ?History of presenting illness-  ?66 y.o. male with PMH listed at below who presents to ER for evaluation of right upper extremity weakness, and difficulty in speech. ?Patient reports that his symptoms started shortly after his recent colonoscopy. ?Patient has severe peripheral artery disease followed by Dr. Delana Meyer.  He was on chronic anticoagulation with Eliquis and Plavix.  Anticoagulation was held temporarily for colonoscopy on 09/09/2021.  After colonoscopy, he developed right lower extremity rest pain, concerning of limb threatening ischemia.  09/25/2021, right lower extremity angiogram showed thrombosis of previous bypass grafts, atherosclerotic occlusive disease, with continuous thrombolytic therapy overnight.  09/26/2021, repeat angiogram and revascularization. ?Patient also reports that he started to have headache after colonoscopy, progressive difficulty using her right arm.  Progressively worsening of speech. ?10/07/2021, MRI brain with and without contrast showed 2 contrast-enhancing lesions, most consistent with metastatic disease.  Largest lesion is in the left frontal lobe and measures up to 1.3 cm with a large amount of surrounding edema and 9 mm rightward midline shift.  The other enhancing lesion was 4 mm in the right occipital lobe with a small amount of surrounding edema.  No acute or chronic hemorrhage. ? 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 ?Recent CT chest without contrast on 08/24/2021 showed waxing and waning groundglass nodules, favor benign infectious/inflammatory process.  Stable postradiation changes of the right hilum. ?09/26/2021, patient had a CT abdomen pelvis with contrast done during his hospitalization due to occluded atherosclerotic disease status post vascular procedure to rule out retroperitoneal hemorrhage.  There was no retroperitoneal hematoma.  There is no obvious radiographic evidence of disease recurrence. ? ?Patient was admitted and was started on dexamethasone 10 mg loading dose followed by 4 mg every 6 hours and was evaluated by neurosurgeon Dr. Cari Caraway. ?Patient was seen at bedside today.  He reports that his speech has improved since admission. ? ?Review of Systems  ?Constitutional:  Negative for appetite change, chills, fatigue, fever and unexpected weight change.  ?HENT:   Negative for hearing loss and voice change.   ?Eyes:  Negative for eye problems and icterus.  ?Respiratory:  Negative for chest tightness, cough and shortness of breath.   ?Cardiovascular:  Negative for chest pain and leg swelling.  ?Gastrointestinal:  Negative for abdominal distention and abdominal pain.  ?Endocrine: Negative for hot flashes.  ?Genitourinary:  Negative for difficulty urinating, dysuria and frequency.   ?Musculoskeletal:  Negative for arthralgias.  ?Skin:  Negative for itching and rash.  ?Neurological:  Positive for headaches and speech difficulty. Negative for light-headedness and numbness.  ?     Right upper extremity weakness  ?Hematological:  Negative for adenopathy. Does not bruise/bleed easily.  ?Psychiatric/Behavioral:  Negative for confusion.   ? ?Allergies  ?Allergen Reactions  ? Tape   ?  Honeycomb Dressing tape- burns his skin ?  ? ? ?Patient Active Problem List  ? Diagnosis Date Noted  ? Brain metastases ([place]) 10/08/2021  ? Hypothyroidism, unspecified 10/08/2021   ?  Elevated glucose 10/08/2021  ? Right hand weakness 10/07/2021  ? Parotid nodule 08/16/2021  ? Dysphagia 06/20/2021  ? COVID 06/20/2021  ? Organizing pneumonia ([place]) 04/04/2021  ? Encounter for antineoplastic immunotherapy 04/04/2021  ? Cancer ([place]) 01/14/2021  ? Antineoplastic chemotherapy induced anemia 10/17/2020  ? 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 Goals of care, counseling/discussion 08/30/2020  ? Iron deficiency anemia 08/30/2020  ? Lung mass 08/30/2020  ? Ischemia of extremity 03/23/2020  ? Cervical myelopathy ([place]) 03/14/2020  ? Ischemia of right lower extremity 11/15/2019  ? Peripheral neuropathy 10/14/2019  ? Chronic left-sided low back pain without sciatica 04/13/2019  ? History of back surgery 04/13/2019  ? Overweight 04/13/2019  ? 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 Complication associated with vascular device 11/13/2016  ? Hyperlipidemia, unspecified 06/18/2016  ? Radiculopathy of lumbar region 06/10/2016  ? PVD (peripheral vascular disease) ([place]) 05/08/2016  ? 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 Prediabetes 08/25/2014  ? ? ? ?Past Medical History:  ?Diagnosis Date  ? Allergy   ? Cancer [facility])   ? lung  ? Dyspnea   ? former smoker. only doe  ? History of kidney stones   ? Hypertension   ? Peripheral vascular disease ([place])   ? Pre-diabetes   ? Sleep apnea   ? ? ? ?Past Surgical History:  ?Procedure Laterality Date  ? ANTERIOR CERVICAL DECOMP/DISCECTOMY FUSION N/A 03/14/2020  ? Procedure: ANTERIOR CERVICAL DECOMPRESSION/DISCECTOMY FUSION 3 LEVELS C4-7;  Surgeon: Meade Maw, MD;  Location: [facility] ORS;  Service: Neurosurgery;  Laterality: N/A;  ? BACK SURGERY  2011  ? CENTRAL LINE INSERTION Right 09/10/2016  ? Procedure: CENTRAL LINE INSERTION;  Surgeon: Delana Meyer Dolores Lory, MD;  Location: [facility] ORS;  Service: Vascular;  Laterality: Right;  ?  CERVICAL FUSION    ? C4-c7 on Mar 14, 2020  ? COLONOSCOPY  2013 ?  ? COLONOSCOPY N/A 09/09/2021  ? Procedure: COLONOSCOPY;  Surgeon: [name], DO;  [facility] ENDOSCOPY;  Service: Gastroenterology;  Laterality: N/A;  ? CORONARY ANGIOPLASTY    ? ESOPHAGOGASTRODUODENOSCOPY N/A 09/09/2021  ? Procedure: ESOPHAGOGASTRODUODENOSCOPY (EGD);  Surgeon: [name], DO;  Location: [facility] ENDOSCOPY;  Service: Gastroenterology;  Laterality: N/A;  ? FEMORAL-POPLITEAL BYPASS GRAFT Right 09/10/2016  ? Procedure: BYPASS GRAFT FEMORAL-POPLITEAL ARTERY ( BELOW KNEE );  Surgeon: Katha Cabal, MD;  Location: [facility] ORS;  Service: Vascular;  Laterality: Right;  ? FEMORAL-TIBIAL BYPASS GRAFT Right 03/23/2020  ? Procedure: BYPASS GRAFT RIGHT FEMORAL- DISTAL TIBIAL ARTERY WITH DISTAL FLOW GRAFT;  Surgeon: Katha Cabal, MD;  Location: [facility] ORS;  Service: Vascular;  Laterality: Right;  ? LEFT HEART CATH AND CORONARY ANGIOGRAPHY Left 05/16/2021  ? Procedure: LEFT HEART CATH AND CORONARY ANGIOGRAPHY;  Surgeon: Andrez Grime, MD;  Location: [place] CV LAB;  Service: Cardiovascular;  Laterality: Left;  ? LOWER EXTREMITY ANGIOGRAPHY Right 11/18/2016  ? Procedure: Lower Extremity Angiography;  Surgeon: Katha Cabal, MD;  Location: [place] CV LAB;  Service: Cardiovascular;  Laterality: Right;  ? LOWER EXTREMITY ANGIOGRAPHY Right 12/09/2016  ? Procedure: Lower Extremity Angiography;  Surgeon: Katha Cabal, MD;  Location: [place] CV LAB;  Service: Cardiovascular;  Laterality: Right;  ? LOWER EXTREMITY ANGIOGRAPHY Right 11/15/2019  ? Procedure: LOWER EXTREMITY ANGIOGRAPHY;  Surgeon: Katha Cabal, MD;  Location: [place] CV LAB;  Service: Cardiovascular;  Laterality: Right;  ? LOWER EXTREMITY ANGIOGRAPHY Right 12/01/2019  ? Procedure: Lower Extremity Angiography;  Surgeon: Algernon Huxley, MD;  Location: [place] CV LAB;  Service: Cardiovascular;  Laterality: Right;  ? LOWER  EXTREMITY ANGIOGRAPHY Right 12/02/2019  ? Procedure: LOWER EXTREMITY ANGIOGRAPHY;  Surgeon: Katha Cabal, MD;  Location: [place] [facility];  Service: Cardiovascular;  Laterality: Right;  ? LOWER EXTR

## 2021-10-08 NOTE — Hospital Course (Addendum)
66 year old man with past medical history of lung cancer, hypertension, peripheral vascular disease and sleep apnea.  He presents to the hospital with slurred speech, right arm incoordination and unsteady gait.  He was found to have brain metastases on MRI of the brain with edema.  He was started on IV Decadron which improved his symptoms.  Patient seen by oncology, neurosurgery and radiation oncology.  After extensive discussion, it was felt that patient's best option would be stereotactic surgery which would allow him to continue on Eliquis without having to stop, which in the past has caused significant vascular occlusion issues.  With this plan, patient able to be discharged as preparation for stereotactic surgery could be done as outpatient which included MRI and simulation. ?

## 2021-10-09 ENCOUNTER — Other Ambulatory Visit: Payer: Self-pay | Admitting: *Deleted

## 2021-10-09 ENCOUNTER — Ambulatory Visit: Payer: Medicare Other | Admitting: Radiation Oncology

## 2021-10-09 ENCOUNTER — Encounter: Payer: Self-pay | Admitting: Internal Medicine

## 2021-10-09 DIAGNOSIS — E663 Overweight: Secondary | ICD-10-CM

## 2021-10-09 DIAGNOSIS — C7931 Secondary malignant neoplasm of brain: Secondary | ICD-10-CM

## 2021-10-09 DIAGNOSIS — C3491 Malignant neoplasm of unspecified part of right bronchus or lung: Secondary | ICD-10-CM

## 2021-10-09 LAB — GLUCOSE, CAPILLARY
Glucose-Capillary: 132 mg/dL — ABNORMAL HIGH (ref 70–99)
Glucose-Capillary: 204 mg/dL — ABNORMAL HIGH (ref 70–99)
Glucose-Capillary: 130 mg/dL — ABNORMAL HIGH (ref 70–99)

## 2021-10-09 LAB — HEMOGLOBIN A1C
Hgb A1c MFr Bld: 5.5 % (ref 4.8–5.6)
Mean Plasma Glucose: 111 mg/dL

## 2021-10-09 MED ORDER — DEXAMETHASONE 4 MG PO TABS: 4.0000 mg | ORAL_TABLET | Freq: Four times a day (QID) | ORAL | 1 refills | Status: DC

## 2021-10-09 MED ORDER — LEVETIRACETAM 500 MG PO TABS: 500.0000 mg | ORAL_TABLET | Freq: Two times a day (BID) | ORAL | 3 refills | Status: DC

## 2021-10-09 NOTE — Progress Notes (Signed)
Pt being discharged home, discharge instructions reviewed with pt, states understanding, pt with no complaints, pt awaiting aunt for transport home  ?

## 2021-10-09 NOTE — Progress Notes (Signed)
Radiation Oncology ?Follow up Note ? ?Name: Joshua [name]   ?Date:   10/07/2021 ?MRN:  [id number] ?DOB: [birth year]  ? ? ?This 66 y.o. 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 ?REFERRING PROVIDER: No ref. provider found ? 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  Patient presented with right-sided weakness was found on MRI scan to have 2 contrast-enhancing lesions most consistent with metastatic disease.  The larger lesion was in the left frontal lobe measuring 1.3 cm and a 9 mm lesion with some slight midline shift.  Patient is also was having some aphasia.  Patient has severe peripheral artery disease is currently on Eliquis and Plavix.  He recently had a colonoscopy for which his anticoagulation therapy was halted and he developed a right lower extremity thrombosis and a previous bypass graft site.  Patient's been seen by neurosurgery and surgery has been declined based on his necessity for anticoagulation therapy.  He is now referred for [facility] treatment to radiation oncology.  He is doing fairly well now that he is on Decadron his speech has improved and some of his right-sided upper extremity weakness has improved. ? ?COMPLICATIONS OF TREATMENT: none ? ?FOLLOW UP COMPLIANCE: keeps appointments  ? ?PHYSICAL EXAM:  ?BP (!) 116/55 (BP Location: Right Arm)   Pulse 78   Temp 97.7 ?F (36.5 ?C)   Resp 16   Ht 5\' 11"  (1.803 m)   Wt 200 lb (90.7 kg)   SpO2 100%   BMI 27.89 kg/m?  ?Crude visual fields are within normal range.  Some slight decreased strength in his right upper extremity.  No other focal neurologic deficits are appreciated.  Proprioception is intact.  Well-developed  well-nourished patient in NAD. HEENT reveals PERLA, EOMI, discs not visualized.  Oral cavity is clear. No oral mucosal lesions are identified. Neck is clear without evidence of cervical or supraclavicular adenopathy. Lungs are clear to A&P. Cardiac examination is essentially unremarkable with regular rate and rhythm without murmur rub or thrill. Abdomen is benign with no organomegaly or masses noted. Motor sensory and DTR levels are equal and symmetric in the upper and lower extremities. Cranial nerves II through XII are grossly intact. Proprioception is intact. No peripheral adenopathy or edema is identified. No motor or sensory levels are noted. Crude visual fields are within normal range. ? ?RADIOLOGY RESULTS: MRI scans reviewed compatible with above-stated findings ? ?PLAN: At this time elected ahead with SRS to both lesions simultaneously.  I have ordered a thin cut MRI scan for treatment planning purposes.  Risks and benefits of treatment including fatigue alteration of blood counts skin reaction and possible hair loss all reviewed with the patient.  We will set him up for simulation after his MRI is complete.  Patient comprehends my recommendations well. ? ?I would like to take this opportunity to thank you for allowing me to participate in the care of your patient.. ?  ? Noreene Filbert, MD ? ?

## 2021-10-09 NOTE — TOC Initial Note (Addendum)
Transition of Care (TOC) - Initial/Assessment Note  ? ? ?Patient Details  ?Name: Joshua [name] ?MRN: [id number] ?Date of Birth: [birth year] ? ?Transition of Care (TOC) CM/SW Contact:    ?Pete Pelt, RN ?Phone Number: ?10/09/2021, 3:46 PM ? ?Clinical Narrative:    Patient lives alone, states he has friends and family to assist him.  He states he feels safe and comfortable with returning home alone.  He has assistance with transportation to appointments and has no concerns about obtaining or taking medications.   ? ?Patient states he accepts home health and has all needed DME.  Patient has [facility].  No home health agencies have committed as of this time due to not being in network.  RNCM will await responses to obtain PT OT home health for patient. ? ?Patient has no other concerns for TOC at this time, but asks that we follow in the event of added needs.  TOC to follow.             ?  Addendum 7672:  patient has declined home health services ? ?Expected Discharge Plan: [place] ?Barriers to Discharge: Continued Medical Work up ? ? ?Patient Goals and CMS Choice ?  ?  ?  ? ?Expected Discharge Plan and Services ?Expected Discharge Plan: [place] ?  ?  ?  ?  ?                ?  ?  ?  ?  ?  ?HH Arranged: PT, OT ?  ?  ?  ?Representative spoke with at [place]: [facility] pending ? ?Prior Living Arrangements/Services ?  ?Lives with:: Self ?Patient language and need for interpreter reviewed:: Yes (no interpreter required) ?Do you feel safe going back to the place where you live?: Yes      ?  ?Care giver support system in place?: Yes (comment) ?Current home services:  (Patient states he has all needed DME) ?Criminal Activity/Legal Involvement Pertinent to Current Situation/Hospitalization: No - Comment as needed ? ?Activities of Daily Living ?Home Assistive Devices/Equipment: Cane (specify quad or straight) ?ADL Screening (condition at time of admission) ?Patient's cognitive  ability adequate to safely complete daily activities?: Yes ?Is the patient deaf or have difficulty hearing?: No ?Does the patient have difficulty seeing, even when wearing glasses/contacts?: No ?Does the patient have difficulty concentrating, remembering, or making decisions?: No ?Patient able to express need for assistance with ADLs?: Yes ?Does the patient have difficulty dressing or bathing?: No ?Independently performs ADLs?: Yes (appropriate for developmental age) ?Does the patient have difficulty walking or climbing stairs?: Yes ?Weakness of Legs: Right ?Weakness of Arms/Hands: Left ? ?Permission Sought/Granted ?Permission sought to share information with : Case Manager ?Permission granted to share information with : Yes, Verbal Permission Granted ?   ? Permission granted to share info w AGENCY: Prospective home health agencies ?   ?   ? ?Emotional Assessment ?Appearance:: Appears stated age ?Attitude/Demeanor/Rapport: Gracious, Engaged ?Affect (typically observed): Appropriate, Pleasant ?Orientation: : Oriented to Self, Oriented to Place, Oriented to  Time, Oriented to Situation ?Alcohol / Substance Use: Not Applicable ?Psych Involvement: No (comment) ? ?Admission diagnosis:  Brain metastases ([place]) [C79.31] ?Metastatic cancer to brain [facility]) [C79.31] ?Headache [R51.9] ?Patient Active Problem List  ? Diagnosis Date Noted  ? Brain metastases ([place]) 10/08/2021  ? Hypothyroidism, unspecified 10/08/2021  ? Elevated glucose 10/08/2021  ? Right hand weakness 10/07/2021  ? Parotid nodule 08/16/2021  ? Dysphagia  06/20/2021  ? COVID 06/20/2021  ? Organizing pneumonia ([place]) 04/04/2021  ? Encounter for antineoplastic immunotherapy 04/04/2021  ? Cancer ([place]) 01/14/2021  ? Antineoplastic chemotherapy induced anemia 10/17/2020  ? Squamous cell carcinoma of right lung ([place]) 09/20/2020  ? Goals of care, counseling/discussion 08/30/2020  ? Iron deficiency anemia 08/30/2020  ? Lung mass 08/30/2020  ? Ischemia of extremity  03/23/2020  ? Cervical myelopathy ([place]) 03/14/2020  ? Ischemia of right lower extremity 11/15/2019  ? Peripheral neuropathy 10/14/2019  ? Chronic left-sided low back pain without sciatica 04/13/2019  ? History of back surgery 04/13/2019  ? Overweight 04/13/2019  ? Atherosclerosis of lower extremity with claudication ([place]) 11/18/2016  ? Complication associated with vascular device 11/13/2016  ? Hyperlipidemia, unspecified 06/18/2016  ? Radiculopathy of lumbar region 06/10/2016  ? PVD (peripheral vascular disease) ([place]) 05/08/2016  ? 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 Prediabetes 08/25/2014  ? ?PCP:  [name], MD ?Pharmacy:   ?[facility] [address] ?[place] [address] ?Phone: [phone] Fax: [phone] ? ? ? ? ?Social Determinants of Health (SDOH) Interventions ?  ? ?Readmission Risk Interventions ? ?  10/09/2021  ?  3:42 PM  ?Readmission Risk Prevention Plan  ?Transportation Screening Complete  ?PCP or Specialist Appt within 5-7 Days Complete  ?Home Care Screening Complete  ?Medication Review (RN CM) Complete  ? ? ? ?

## 2021-10-09 NOTE — Progress Notes (Signed)
Physical Therapy Treatment ?Patient Details ?Name: Joshua [name] ?MRN: [id number] ?DOB: March 07, 1956 ?Today's Date: 10/09/2021 ? ? ?History of Present Illness Pt is 66 y/o male with c/o headache and weakness. MRI with results of two contrast enhancing lesions, most consistent with metastatic  disease. The largest lesion is in the left frontal lobe and measures  up to 1.3 cm with a large amount of surrounding edema and 9 mm  rightward midline shift. ? ?  ?PT Comments  ? ? Pt was eager to work with PT and ultimately did well.  He was able to ambulate multiple loops around the nurses' station with [facility] and did not have any overt LOBs.  He did well with light HHA balance challenge exercises but did generally need at least light finger tip assist (R SLS required heavy UE use).  Pt continues to have significant R hand coordination issues, discussed exercises and strategies to work with this at home.  Good effort and better awareness of limitations this date.    ?Recommendations for follow up therapy are one component of a multi-disciplinary discharge planning process, led by the attending physician.  Recommendations may be updated based on patient status, additional functional criteria and insurance authorization. ? ?Follow Up Recommendations ? Home health PT ?  ?  ?Assistance Recommended at Discharge Intermittent Supervision/Assistance  ?Patient can return home with the following Assist for transportation;Assistance with cooking/housework ?  ?Equipment Recommendations ? None recommended by PT  ?  ?Recommendations for Other Services   ? ? ?  ?Precautions / Restrictions Precautions ?Precautions: Fall ?Restrictions ?Weight Bearing Restrictions: No  ?  ? ?Mobility ? Bed Mobility ?Overal bed mobility: Modified Independent ?Bed Mobility: Supine to Sit ?  ?  ?Supine to sit: Modified independent (Device/Increase time) ?  ?  ?General bed mobility comments: easily gets supine to sit w/o assist, good confidence ?   ? ?Transfers ?Overall transfer level: Modified independent ?Equipment used: Straight cane ?Transfers: Sit to/from Stand ?Sit to Stand: Supervision ?  ?  ?  ?  ?  ?General transfer comment: rises to standing multiple times t/o session ?  ? ?Ambulation/Gait ?Ambulation/Gait assistance: Supervision ?Gait Distance (Feet): 350 Feet ?Assistive device: Straight cane ?  ?  ?  ?  ?General Gait Details: holding cane in L hand, pt with baseline low grade  (R>L) limping pattern that does not cause LOBs or overt unsteadiness.  His HR and O2 in the mid 90s most of the time w/o c/o excessive fatigue.  Pt was able to maintain slower (still very functional) and more deliberate cadence with cuing today ? ? ?Stairs ?  ?  ?  ?  ?  ? ? ?Wheelchair Mobility ?  ? ?Modified Rankin (Stroke Patients Only) ?  ? ? ?  ?Balance Overall balance assessment: Needs assistance ?  ?Sitting balance-Leahy Scale: Good ?  ?  ?Standing balance support: During functional activity ?Standing balance-Leahy Scale: Fair ?Standing balance comment: less impulsivity and increased respect of his need to be more deliberate ambulation ?  ?  ?  ?  ?  ?  ?  ?  ?  ?  ?  ?  ? ?  ?Cognition Arousal/Alertness: Awake/alert ?Behavior During Therapy: Impulsive (less so than previous session) ?Overall Cognitive Status: Within Functional Limits for tasks assessed ?  ?  ?  ?  ?  ?  ?  ?  ?  ?  ?  ?  ?  ?  ?  ?  ?  ?  ?  ? ?  ?  Exercises Other Exercises ?Other Exercises: standing balance exercises with graded UE support.  Multiple reps of heel raises, SLS, EO/EC shld width/NBOS with and without external perturbations, heel-toe and retro ambulation. ? ?  ?General Comments   ?  ?  ? ?Pertinent Vitals/Pain Pain Assessment ?Pain Assessment: No/denies pain  ? ? ?Home Living   ?  ?  ?  ?  ?  ?  ?  ?  ?  ?   ?  ?Prior Function    ?  ?  ?   ? ?PT Goals (current goals can now be found in the care plan section) Progress towards PT goals: Progressing toward goals ? ?  ?Frequency ? ? ? Min  2X/week ? ? ? ?  ?PT Plan Current plan remains appropriate  ? ? ?Co-evaluation   ?  ?  ?  ?  ? ?  ?AM-PAC PT "6 Clicks" Mobility   ?Outcome Measure ? Help needed turning from your back to your side while in a flat bed without using bedrails?: None ?Help needed moving from lying on your back to sitting on the side of a flat bed without using bedrails?: None ?Help needed moving to and from a bed to a chair (including a wheelchair)?: A Little ?Help needed standing up from a chair using your arms (e.g., wheelchair or bedside chair)?: A Little ?Help needed to walk in hospital room?: A Little ?Help needed climbing 3-5 steps with a railing? : A Lot ?6 Click Score: 19 ? ?  ?End of Session Equipment Utilized During Treatment: Gait belt ?Activity Tolerance: Patient tolerated treatment well ?Patient left: with chair alarm set;with call bell/phone within reach ?Nurse Communication: Mobility status ?PT Visit Diagnosis: Unsteadiness on feet (R26.81);Muscle weakness (generalized) (M62.81);Other symptoms and signs involving the nervous system (R29.898) ?  ? ? ?Time: 7628-3151 ?PT Time Calculation (min) (ACUTE ONLY): 29 min ? ?Charges:  $Gait Training: 8-22 mins ?$Therapeutic Exercise: 8-22 mins          ?          ? ?Kreg Shropshire, DPT ?10/09/2021, 1:59 PM ? ?

## 2021-10-09 NOTE — Discharge Summary (Signed)
?Physician Discharge Summary ?  ?Patient: Joshua [name] MRN: [id number] DOB: 1955/11/27  ?Admit date:     10/07/2021  ?Discharge date: 10/09/21  ?Discharge Physician: Annita Brod  ? ?PCP: [name], MD  ? ?Recommendations at discharge:  ? ?Patient declined home health PT and OT ?Patient will have outpatient MRI of the brain thin slice ?Patient will have simulation prior to stereotactic radiosurgery.  This will be arranged by radiation oncology as outpatient ?Patient will need PET scan.  Appointment set up through radiology following discharge. ? ?Discharge Diagnoses: ?Principal Problem: ?  Brain metastases ([place]) ?Active Problems: ?  Squamous cell carcinoma of right lung ([place]) ?  Right hand weakness ?  PVD (peripheral vascular disease) ([place]) ?  Iron deficiency anemia ?  Elevated glucose ?  Hyperlipidemia, unspecified ?  Hypothyroidism, unspecified ?  Overweight ? ?Resolved Problems: ?  Hypertension ?  Headache ? ?Hospital Course: ?66 year old man with past medical history of lung cancer, hypertension, peripheral vascular disease and sleep apnea.  He presents to the hospital with slurred speech, right arm incoordination and unsteady gait.  He was found to have brain metastases on MRI of the brain with edema.  He was started on IV Decadron which improved his symptoms.  Patient seen by oncology, neurosurgery and radiation oncology.  After extensive discussion, it was felt that patient's best option would be stereotactic surgery which would allow him to continue on Eliquis without having to stop, which in the past has caused significant vascular occlusion issues.  With this plan, patient able to be discharged as preparation for stereotactic surgery could be done as outpatient which included MRI and simulation. ? ?Assessment and Plan: ?* Brain metastases ([place]) ?Patient started on IV Decadron.  MRI of the brain shows 2 contrast-enhancing lesions most consistent with metastatic disease the largest  lesion in left frontal lobe measuring up to 1.3 cm with a large amount of surrounding edema and rightward midline shift.  See below.  Radiation oncology felt patient would be a good candidate for stereotactic focal radiation.  Needs thin slice MRI and simulation prior to starting.  This can be done as outpatient.  Patient will follow-up with radiation oncology.  Discharged on empiric Keppra and p.o. Decadron.  Patient seen by physical and Occupational Therapy who recommended home health which patient declined ? ?Squamous cell carcinoma of right lung ([place]) ?With metastatic brain lesions now has stage IV lung cancer.  Oncology discussed with neurosurgery and radiation oncology with plan for stereotactic radiosurgery as patient's best option.  We will also need outpatient PET scan. ? ?Right hand weakness ?Secondary to brain mets.  Mild improvement on Decadron.  PT and OT recommending home health, which she declined. ? ?PVD (peripheral vascular disease) ([place]) ?As per vascular surgery to try to continue anticoagulation.  Patient on Eliquis and Plavix.  Patient is at high risk for occlusive event of anticoagulation were to be stopped further surgery.  Fortunately, able to to continue on his Eliquis and Plavix and not have to stop for stereotactic radiosurgery. ? ?Iron deficiency anemia ?Ferritin back in 2022 low at 25 going along with iron deficiency anemia.  Last hemoglobin 11.1 ? ?Elevated glucose ?Secondary to high-dose steroids.  A1c at 5.5.  On sliding scale. ? ?Hyperlipidemia, unspecified ?Continue Zocor ? ?Hypothyroidism, unspecified ?Continue levothyroxine ? ?Overweight ?Meets criteria with BMI greater than 25 ? ? ? ? ?  ? ? ?Consultants: Medical oncology, neurosurgery, radiation oncology ?Procedures performed: None ?Disposition: Home ?Diet recommendation:  ?  Discharge Diet Orders (From admission, onward)  ? ?  Start     Ordered  ? 10/09/21 0000  Diet - low sodium heart healthy       ? 10/09/21 1559  ? ?  ?  ? ?   ? ?Cardiac diet ?DISCHARGE MEDICATION: ?Allergies as of 10/09/2021   ? ?   Reactions  ? Tape   ? [place] Dressing tape- burns his skin  ? ?  ? ?  ?Medication List  ?  ? ?TAKE these medications   ? ?acetaminophen 325 MG tablet ?Commonly known as: TYLENOL ?Take 650 mg by mouth every 6 (six) hours as needed for moderate pain. ?  ?clopidogrel 75 MG tablet ?Commonly known as: PLAVIX ?TAKE 1 TABLET BY MOUTH  DAILY ?  ?dexamethasone 4 MG tablet ?Commonly known as: DECADRON ?Take 1 tablet (4 mg total) by mouth 4 (four) times daily. ?  ?Eliquis 5 MG Tabs tablet ?Generic drug: apixaban ?TAKE 1 TABLET BY MOUTH  TWICE DAILY ?  ?HYDROcodone-acetaminophen 5-325 MG tablet ?Commonly known as: NORCO/VICODIN ?Take 1 tablet by mouth every 6 (six) hours as needed for moderate pain. ?  ?Klor-Con M20 20 MEQ tablet ?Generic drug: potassium chloride SA ?Take 1 tablet by mouth once daily ?  ?levETIRAcetam 500 MG tablet ?Commonly known as: KEPPRA ?Take 1 tablet (500 mg total) by mouth 2 (two) times daily. ?  ?levothyroxine 88 MCG tablet ?Commonly known as: Synthroid ?Take 1 tablet (88 mcg total) by mouth daily before breakfast. ?  ?lidocaine-prilocaine cream ?Commonly known as: EMLA ?Apply to affected area once ?What changed:  ?how much to take ?how to take this ?when to take this ?reasons to take this ?additional instructions ?  ?simvastatin 40 MG tablet ?Commonly known as: ZOCOR ?Take 40 mg by mouth at bedtime. ?  ? ?  ? ? Follow-up Information   ? ? Noreene Filbert, MD Follow up.   ?Specialty: Radiation Oncology ?Why: The office will call you to set up a simulation.  Call them on Monday, 3/27, if you have not heard from them. ?Contact information: ?[address]   ?[place] [address] ?[phone] ? ? ?  ?  ? ? [facility] Follow up.   ?Why: They will call you to set up appointment for your MRI. ?Contact information: ?[address] ?[place] ? ?  ?  ? ?  ?  ? ?  ? ?Discharge  Exam: ?Filed Weights  ? 10/07/21 1706  ?Weight: 90.7 kg  ? ?General: Alert and oriented x3, no acute distress ?Cardiovascular: Regular rate and rhythm, S1-S2 ?Lungs: Clear to auscultation bilaterally ? ?Condition at discharge: good ? ?The results of significant diagnostics from this hospitalization (including imaging, microbiology, ancillary and laboratory) are listed below for reference.  ? ?Imaging Studies: ?MR Brain W and Wo Contrast ? ?Result Date: 10/07/2021 ?CLINICAL DATA:  Acute neurologic deficit EXAM: MRI HEAD WITHOUT AND WITH CONTRAST TECHNIQUE: Multiplanar, multiecho pulse sequences of the brain and surrounding structures were obtained without and with intravenous contrast. CONTRAST:  16mL GADAVIST GADOBUTROL 1 MMOL/ML IV SOLN COMPARISON:  Brain MRI 10/03/2020 FINDINGS: Brain: No acute infarct. There is a contrast-enhancing mass in the left frontal lobe that measures 1.3 x 1.2 cm. There is a large amount of surrounding edema that causes rightward midline shift of 9 mm. There is a 4 mm contrast enhancing lesion in the right occipital lobe with a small amount of surrounding edema. No acute or  chronic hemorrhage. Vascular: Major flow voids are preserved. Skull and upper cervical spine: Normal calvarium and skull base. Visualized upper cervical spine and soft tissues are normal. Sinuses/Orbits:No paranasal sinus fluid levels or advanced mucosal thickening. No mastoid or middle ear effusion. Normal orbits. IMPRESSION: Two contrast enhancing lesions, most consistent with metastatic disease. The largest lesion is in the left frontal lobe and measures up to 1.3 cm with a large amount of surrounding edema and 9 mm rightward midline shift. Electronically Signed   By: Ulyses Jarred M.D.   On: 10/07/2021 22:06  ? ?MR CERVICAL SPINE WO CONTRAST ? ?Result Date: 10/02/2021 ?CLINICAL DATA:  Neck pain and soreness. Headache. Weakness in the right hand. EXAM: MRI CERVICAL SPINE WITHOUT CONTRAST TECHNIQUE: Multiplanar,  multisequence MR imaging of the cervical spine was performed. No intravenous contrast was administered. COMPARISON:  Prior cervical MRI 02/06/2020. CT neck 07/09/2021. MRI brain 10/03/2020. FINDINGS: Alignment: 1 mm

## 2021-10-09 NOTE — Progress Notes (Signed)
Occupational Therapy Treatment ?Patient Details ?Name: Joshua [name] ?MRN: [id number] ?DOB: 1955/10/16 ?Today's Date: 10/09/2021 ? ? ?History of present illness Pt is 66 y/o male with c/o headache and weakness. MRI with results of two contrast enhancing lesions, most consistent with metastatic  disease. The largest lesion is in the left frontal lobe and measures  up to 1.3 cm with a large amount of surrounding edema and 9 mm  rightward midline shift. ?  ?OT comments ? Upon entering the room, pt supine in bed and agreeable to OT intervention. Pt is pleasant and cooperative throughout. He shows less impulsiveness and better insight to deficits this session. Pt performs bed mobility without assistance. He stands and transfers to standard chair with supervision and dons B socks with increased time and figure four position. Pt ambulates 200' with RW and supervision for safety around RN station with no LOB . Pt returns to room to commode with supervision for transfer. He requests therapist to leave for BM. NT notified and pt instructed to pull call bell when ready. Pt continues to benefit from OT intervention with new recommendation for [facility] as pt will have family assist at discharge.  ? ?Recommendations for follow up therapy are one component of a multi-disciplinary discharge planning process, led by the attending physician.  Recommendations may be updated based on patient status, additional functional criteria and insurance authorization. ?   ?Follow Up Recommendations ? Home health OT  ?  ?Assistance Recommended at Discharge Intermittent Supervision/Assistance  ?Patient can return home with the following ? Assistance with cooking/housework;Help with stairs or ramp for entrance;Assist for transportation;Direct supervision/assist for medications management;Direct supervision/assist for financial management ?  ?Equipment Recommendations ? None recommended by OT  ?  ?   ?Precautions / Restrictions  Precautions ?Precautions: Fall ?Restrictions ?Weight Bearing Restrictions: No  ? ? ?  ? ?Mobility Bed Mobility ?Overal bed mobility: Modified Independent ?Bed Mobility: Supine to Sit ?  ?  ?Supine to sit: Modified independent (Device/Increase time) ?  ?  ?  ?  ? ?Transfers ?Overall transfer level: Needs assistance ?Equipment used: Rolling walker (2 wheels) ?Transfers: Sit to/from Stand ?Sit to Stand: Supervision ?  ?  ?  ?  ?  ?  ?  ?  ?Balance Overall balance assessment: Needs assistance ?Sitting-balance support: Feet supported ?Sitting balance-Leahy Scale: Good ?  ?  ?Standing balance support: During functional activity ?Standing balance-Leahy Scale: Fair ?  ?  ?  ?  ?  ?  ?  ?  ?  ?  ?  ?  ?   ? ?ADL either performed or assessed with clinical judgement  ? ?ADL Overall ADL's : Needs assistance/impaired ?  ?  ?  ?  ?  ?  ?  ?  ?  ?  ?Lower Body Dressing: Supervision/safety;Set up;Sit to/from stand ?  ?Toilet Transfer: Supervision/safety;Rolling walker (2 wheels) ?  ?  ?  ?  ?  ?  ?  ?  ? ?Extremity/Trunk Assessment Upper Extremity Assessment ?Upper Extremity Assessment: RUE deficits/detail ?RUE Deficits / Details: decreased coordination,dexterity, and speed with movement. Full AROM with strength grossly 3+/5. ?  ?  ?  ?  ?  ? ?Vision Patient Visual Report: No change from baseline ?  ?  ?   ?   ? ?Cognition Arousal/Alertness: Awake/alert ?Behavior During Therapy: [facility] for tasks assessed/performed ?Overall Cognitive Status: Within Functional Limits for tasks assessed ?  ?  ?  ?  ?  ?  ?  ?  ?  ?  ?  ?  ?  ?  ?  ?  ?  General Comments: Pt showing greater insight to deficits and safety awareness. ?  ?  ?   ?   ?   ?   ? ? ?Pertinent Vitals/ Pain       Pain Assessment ?Pain Assessment: No/denies pain ? ?   ?   ? ?Frequency ? Min 3X/week  ? ? ? ? ?  ?Progress Toward Goals ? ?OT Goals(current goals can now be found in the care plan section) ? Progress towards OT goals: Progressing toward goals ? ?Acute Rehab OT  Goals ?Patient Stated Goal: to go home ?OT Goal Formulation: With patient ?Time For Goal Achievement: 10/22/21 ?Potential to Achieve Goals: Fair  ?Plan Discharge plan needs to be updated;Frequency remains appropriate   ? ?   ?AM-PAC OT "6 Clicks" Daily Activity     ?Outcome Measure ? ? Help from another person eating meals?: None ?Help from another person taking care of personal grooming?: None ?Help from another person toileting, which includes using toliet, bedpan, or urinal?: A Little ?Help from another person bathing (including washing, rinsing, drying)?: A Little ?Help from another person to put on and taking off regular upper body clothing?: None ?Help from another person to put on and taking off regular lower body clothing?: A Little ?6 Click Score: 21 ? ?  ?End of Session Equipment Utilized During Treatment: Rolling walker (2 wheels) ? ?OT Visit Diagnosis: Unsteadiness on feet (R26.81);Repeated falls (R29.6);Muscle weakness (generalized) (M62.81) ?  ?Activity Tolerance Patient tolerated treatment well ?  ?Patient Left with call bell/phone within reach;Other (comment) (on toilet in bathroom) ?  ?Nurse Communication Mobility status;Other (comment) (pt on commode) ?  ? ?   ? ?Time: 1610-9604 ?OT Time Calculation (min): 23 min ? ?Charges: OT General Charges ?$OT Visit: 1 Visit ?OT Treatments ?$Self Care/Home Management : 8-22 mins ?$Therapeutic Activity: 8-22 mins ? ?Darleen Crocker, [place], OTR/L , CBIS ?ascom [phone]  ?10/09/21, 2:04 PM  ?

## 2021-10-09 NOTE — Assessment & Plan Note (Signed)
Meets criteria with BMI greater than 25 

## 2021-10-10 ENCOUNTER — Telehealth: Payer: Self-pay

## 2021-10-10 DIAGNOSIS — C3491 Malignant neoplasm of unspecified part of right bronchus or lung: Secondary | ICD-10-CM

## 2021-10-10 NOTE — Telephone Encounter (Signed)
-----   Message from [name], MD sent at 10/10/2021  8:19 AM EDT ----- ?Hi Team,  ?He has brain mets. Please schedule him to have PET restaging ASAP. Please set him up to see Dr.Chrystal.  ? He has a follow up appt with me on 3/24. Please cancel and reschedule him to see me a few days after PET is done. Thanks.  ? ?Joshua [name] ? ? ?

## 2021-10-10 NOTE — Telephone Encounter (Addendum)
Please schedule PET- ASAP and MD only a few days after PET. Please cancel appt on 3/24 and inform pt of updated appts. Thanks  ?

## 2021-10-10 NOTE — Telephone Encounter (Signed)
Pt appt with MD made taken care of. Pt aware ?

## 2021-10-11 ENCOUNTER — Inpatient Hospital Stay: Payer: Medicare Other

## 2021-10-11 ENCOUNTER — Ambulatory Visit: Payer: Medicare Other | Admitting: Oncology

## 2021-10-11 ENCOUNTER — Telehealth: Payer: Self-pay | Admitting: Oncology

## 2021-10-11 ENCOUNTER — Inpatient Hospital Stay: Payer: Medicare Other | Admitting: Oncology

## 2021-10-11 ENCOUNTER — Other Ambulatory Visit: Payer: Medicare Other

## 2021-10-11 ENCOUNTER — Ambulatory Visit: Payer: Medicare Other

## 2021-10-11 NOTE — Telephone Encounter (Signed)
Called patient for follow up. He is compliant with keppra and steroid.  ?He says " he was aggravated this morning and he is sorry about that". He is doing well currently.  ?He knows his appt date and time with me next week. PET is schedule and he is aware of the appt.  ? ?

## 2021-10-11 NOTE — Telephone Encounter (Signed)
Patient showed up to the [facility] today expecting to have an appointment with Dr. Tasia Catchings. On 3/23 there were changes made to his schedule including the scheduling of an ASAP PET scan and moving his previously scheduled MD appointment on 3/24 to 3/31. Patient was made aware of these changes by RN and scheduling team.Per MD--PET scan results are needed prior to his return. Patient was visibly upset and stated that "no one told me" about changes to his appointments. He also stated that if "PET scan had been done 4 months ago, I wouldn't be dying" and that he was "tired of this crap".Patient was walking toward the exit at this point in which I accompanied him. Suggested that he please have a seat so we can continue the conversation and see if we might include the clinical team. Patient refused. Also suggested I call a family member or support member. Patient continued to exit into the parking lot.  ?

## 2021-10-16 ENCOUNTER — Other Ambulatory Visit: Payer: Self-pay

## 2021-10-16 ENCOUNTER — Ambulatory Visit (HOSPITAL_COMMUNITY)
Admission: RE | Admit: 2021-10-16 | Discharge: 2021-10-16 | Disposition: A | Discharge status: Home / Self Care | Payer: Medicare Other | Source: Ambulatory Visit | Attending: Oncology | Admitting: Oncology

## 2021-10-16 DIAGNOSIS — C3491 Malignant neoplasm of unspecified part of right bronchus or lung: Secondary | ICD-10-CM | POA: Diagnosis present

## 2021-10-16 LAB — GLUCOSE, CAPILLARY: Glucose-Capillary: 127 mg/dL — ABNORMAL HIGH (ref 70–99)

## 2021-10-16 MED ORDER — FLUDEOXYGLUCOSE F - 18 (FDG) INJECTION
9.9400 | Freq: Once | INTRAVENOUS | Status: AC
  Administered 2021-10-16: 9.94 via INTRAVENOUS

## 2021-10-18 ENCOUNTER — Encounter: Payer: Self-pay | Admitting: Oncology

## 2021-10-18 ENCOUNTER — Inpatient Hospital Stay: Payer: Medicare Other | Attending: Oncology | Admitting: Oncology

## 2021-10-18 VITALS — BP 162/87 | HR 72 | Temp 97.0°F | Ht 71.0 in | Wt 197.0 lb

## 2021-10-18 DIAGNOSIS — E032 Hypothyroidism due to medicaments and other exogenous substances: Secondary | ICD-10-CM | POA: Insufficient documentation

## 2021-10-18 DIAGNOSIS — C3491 Malignant neoplasm of unspecified part of right bronchus or lung: Secondary | ICD-10-CM | POA: Diagnosis not present

## 2021-10-18 DIAGNOSIS — C7931 Secondary malignant neoplasm of brain: Secondary | ICD-10-CM | POA: Insufficient documentation

## 2021-10-18 DIAGNOSIS — Z7189 Other specified counseling: Secondary | ICD-10-CM | POA: Diagnosis not present

## 2021-10-18 DIAGNOSIS — Z79899 Other long term (current) drug therapy: Secondary | ICD-10-CM | POA: Insufficient documentation

## 2021-10-18 DIAGNOSIS — Z7952 Long term (current) use of systemic steroids: Secondary | ICD-10-CM | POA: Diagnosis not present

## 2021-10-18 DIAGNOSIS — Z79890 Hormone replacement therapy (postmenopausal): Secondary | ICD-10-CM | POA: Insufficient documentation

## 2021-10-18 DIAGNOSIS — Z923 Personal history of irradiation: Secondary | ICD-10-CM | POA: Insufficient documentation

## 2021-10-18 DIAGNOSIS — C3411 Malignant neoplasm of upper lobe, right bronchus or lung: Secondary | ICD-10-CM | POA: Insufficient documentation

## 2021-10-18 DIAGNOSIS — Z9221 Personal history of antineoplastic chemotherapy: Secondary | ICD-10-CM | POA: Diagnosis not present

## 2021-10-18 DIAGNOSIS — Z87891 Personal history of nicotine dependence: Secondary | ICD-10-CM | POA: Diagnosis not present

## 2021-10-18 DIAGNOSIS — Z7901 Long term (current) use of anticoagulants: Secondary | ICD-10-CM | POA: Diagnosis not present

## 2021-10-18 MED ORDER — FAMOTIDINE 20 MG PO TABS: 20.0000 mg | ORAL_TABLET | Freq: Every day | ORAL | 0 refills | Status: DC

## 2021-10-18 NOTE — Progress Notes (Signed)
?Hematology/Oncology Progress note ?Telephone:([phone] [phone] ?  ? ? ?Patient Care Team: ?Patrice Paradise, MD as PCP - General (Physician Assistant) ?Patrice Paradise, MD as Referring Physician (Physician Assistant) ?Earline Mayotte, MD (General Surgery) ?Glory Buff, RN as Agricultural consultant ? ?REFERRING PROVIDER: ?Patrice Paradise, MD  ?CHIEF COMPLAINTS/REASON FOR VISIT:  ?Follow up for lung cancer ? ?HISTORY OF PRESENTING ILLNESS:  ? ?Joshua [name] is a  66 y.o.  male with PMH listed below was seen in consultation at the request of  Patrice Paradise, MD  for evaluation of lung mass ? ?Patient presented to emergency room on 08/25/2020 for evaluation of hemoptysis, coughing.  He coughed up small amount of blood and a large clot.  No fever, chills, chest pain, abdominal pain.  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  Patient is on Plavix and Eliquis.  Since his ER visit, he has not coughed up more blood.  ?08/25/2020 CT angio chest PE with and without contrast showed ill-defined soft tissue adjacent to the right upper lobe bronchus resulting in narrowing of right upper lobe bronchus.  Also right paratracheal mediastinal adenopathy.  Scattered centrilobular groundglass opacities within the right lower lobe and to a lesser extent within the right lower lobe likely reflecting an infectious or inflammatory process. ?# 09/14/2020, right upper lobe lung biopsy and bronchoscopy brushing are both positive for squamous cell carcinoma.Right precarinal space lymph node insufficient lymph node sampling for staging. ?09/18/2020, PET scan showed right upper lobe nodule maximal SUV 11.5.  Hypermetabolic cluster right paratracheal adenopathy with maximum SUV up to 12.  No findings of metastatic disease to the neck/abdomen/pelvis or skeleton. ? ?#  10/03/2000, MRI brain with and without contrast showed no evidence of intracranial metastatic disease. ?Well-defined round lesion within the left parotid tail measuring ?approximately 2.3 x 1.8 x 1.8 cm ?# Mediport placed by vascular surgeon.  ?# - NGS showed PD-L1 TPS 95%, LRP1B S961fs, PIK3CA E545K, RB1 c1422-2A>T, RIT1 T83_ A84del, STK11 P252fs, TPS53 R248W, TMB 19.25mut/mb, MS stable.  ? 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  Management per ENT  ? ?#10/10/2020 started concurrent chemoradiation.  Patient received 4 cycles of carboplatin AUC of 2 and Taxol 45 mg per metered square  Additional chemotherapy was held due to thrombocytopenia, neutropenia ?Patient finished radiation on 11/27/2020 ? ?#12/31/2020 -03/21/2021, Durvalumab maintenance ? ?03/28/2021, CT without contrast showed new linear right perihilar consolidation with associated traction bronchiectasis.  Likely postradiation changes.  Additional new peribronchial vascular/nodular groundglass opacities with areas of consolidation or seen primarily in the right upper and lower lobes, more distant from expected radiation fields.  Findings are possibly due to radiation-induced organizing pneumonia.  Or superimposed drug toxicity.  Recommend follow-up CT chest in 1 to 2 months for assessment of evaluation. ? 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  Patient was seen by pulmonology Dr. Jayme Cloud. ?Per my discussion with pulmonology Dr. 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  Recent CT showed improvement. ? ?05/09/2021, repeat CT chest without contrast showed evolving radiation changes in the right perihilar region likely accounting for interval increase to thickening of the posterior wall of the right upper lobe bronchus.  The more peripheral right lung groundglass opacities seen on most recent study have generally improved with exception of one peripherally right upper lobe which appears new.  These  are likely inflammatory.  No findings highly suspicious for local recurrence. ? ?05/30/2021 resumed on Durvalumab.  ?Dr. Patsey Berthold switched inhaler to  [facility] as he is not able to tolerate LABA/LAMA combination. ? ?#Left-sided parotid nodule, patient follows up with ENT.  Nodule was biopsied and pathology was not diagnostic, acellular debris's with amylase crystalloids.#Left-sided parotid nodule, patient follows up with ENT.  Nodule was biopsied and pathology was not diagnostic, acellular debris's with amylase crystalloids. ? ?INTERVAL HISTORY ?Joshua [name] is a 66 y.o. male who has above history reviewed by me today presents for follow up visit for Stage III lung squamous cell carcinoma. ? ?Patient has severe peripheral artery disease followed by Dr. Delana Meyer.  He was on chronic anticoagulation with Eliquis and Plavix.  Anticoagulation was held temporarily for colonoscopy on 09/09/2021.  After colonoscopy, he developed right lower extremity rest pain, concerning of limb threatening ischemia.   ?09/25/2021, right lower extremity angiogram showed thrombosis of previous bypass grafts, atherosclerotic occlusive disease, with continuous thrombolytic therapy overnight.  09/26/2021, repeat angiogram and revascularization. ?Patient also reports that he started to have headache after colonoscopy, progressive difficulty using her right arm.  Progressively worsening of speech. ?10/07/2021, MRI brain with and without contrast showed 2 contrast-enhancing lesions, most consistent with metastatic disease.  Largest lesion is in the left frontal lobe and measures up to 1.3 cm with a large amount of surrounding edema and 9 mm rightward midline shift.  The other enhancing lesion was 4 mm in the right occipital lobe with a small amount of surrounding edema.  No acute or chronic hemorrhage. ? ?Patient was started on Dexamethasone.  He was seen by neurosurgery and radiation oncology.  Recommendation is to proceed with SRS.  Patient was  discharged with dexamethasone. ? ?3 days ago, patient reported a fall after losing balance..  He has had bruising of left ulna side of forearm, and an area of the left flank.  Denies hitting his head.  He has had a PET scan done and present to discuss results and future plan.  He has had SRS scheduled next week. ?Right upper extremity weakness/decreased coordination has improved.  He currently takes dexamethasone 4 mg 4 times a day. ? ?Review of Systems  ?Constitutional:  Positive for fatigue. Negative for appetite change, chills, fever and unexpected weight change.  ?HENT:   Negative for hearing loss and voice change.   ?Eyes:  Negative for eye problems and icterus.  ?Respiratory:  Negative for chest tightness, cough and shortness of breath.   ?Cardiovascular:  Negative for chest pain and leg swelling.  ?Gastrointestinal:  Negative for abdominal distention and abdominal pain.  ?Endocrine: Negative for hot flashes.  ?Genitourinary:  Negative for difficulty urinating, dysuria and frequency.   ?Musculoskeletal:  Negative for arthralgias.  ?Skin:  Negative for itching and rash.  ?Neurological:  Negative for light-headedness and numbness.  ?     Right upper extremity strength has improved.  ?Hematological:  Negative for adenopathy. Does not bruise/bleed easily.  ?Psychiatric/Behavioral:  Negative for confusion.   ?  ? ?MEDICAL HISTORY:  ?Past Medical History:  ?Diagnosis Date  ? Allergy   ? Cancer [facility])   ? lung  ? Dyspnea   ? former smoker. only doe  ? History of kidney stones   ? Hypertension   ? Peripheral vascular disease ([place])   ? Pre-diabetes   ? Sleep apnea   ? ? ?SURGICAL HISTORY: ?Past Surgical History:  ?Procedure Laterality Date  ? ANTERIOR CERVICAL DECOMP/DISCECTOMY FUSION N/A 03/14/2020  ? Procedure:  ANTERIOR CERVICAL DECOMPRESSION/DISCECTOMY FUSION 3 LEVELS C4-7;  Surgeon: Meade Maw, MD;  Location: [facility] ORS;  Service: Neurosurgery;  Laterality: N/A;  ? BACK SURGERY  2011  ? CENTRAL LINE INSERTION  Right 09/10/2016  ? Procedure: CENTRAL LINE INSERTION;  Surgeon: Delana Meyer Dolores Lory, MD;  Location: [facility] ORS;  Service: Vascular;  Laterality: Right;  ? CERVICAL FUSION    ? C4-c7 on Mar 14, 2020  ? COLONOSCOPY

## 2021-10-22 ENCOUNTER — Ambulatory Visit
Admission: RE | Admit: 2021-10-22 | Discharge: 2021-10-22 | Disposition: A | Discharge status: Home / Self Care | Payer: Medicare Other | Source: Ambulatory Visit | Attending: Radiation Oncology | Admitting: Radiation Oncology

## 2021-10-22 DIAGNOSIS — C7931 Secondary malignant neoplasm of brain: Secondary | ICD-10-CM | POA: Insufficient documentation

## 2021-10-22 MED ORDER — GADOBUTROL 1 MMOL/ML IV SOLN
10.0000 mL | Freq: Once | INTRAVENOUS | Status: AC | PRN
  Administered 2021-10-22: 10 mL via INTRAVENOUS

## 2021-10-23 ENCOUNTER — Ambulatory Visit (INDEPENDENT_AMBULATORY_CARE_PROVIDER_SITE_OTHER): Payer: Medicare Other

## 2021-10-23 ENCOUNTER — Encounter (INDEPENDENT_AMBULATORY_CARE_PROVIDER_SITE_OTHER): Payer: Self-pay | Admitting: Nurse Practitioner

## 2021-10-23 ENCOUNTER — Ambulatory Visit
Admission: RE | Admit: 2021-10-23 | Discharge: 2021-10-23 | Disposition: A | Discharge status: Home / Self Care | Payer: Medicare Other | Source: Ambulatory Visit | Attending: Radiation Oncology | Admitting: Radiation Oncology

## 2021-10-23 ENCOUNTER — Ambulatory Visit (INDEPENDENT_AMBULATORY_CARE_PROVIDER_SITE_OTHER): Payer: Medicare Other | Admitting: Nurse Practitioner

## 2021-10-23 VITALS — BP 158/80 | HR 80 | Resp 16 | Wt 196.0 lb

## 2021-10-23 DIAGNOSIS — I70219 Atherosclerosis of native arteries of extremities with intermittent claudication, unspecified extremity: Secondary | ICD-10-CM

## 2021-10-23 DIAGNOSIS — C7931 Secondary malignant neoplasm of brain: Secondary | ICD-10-CM | POA: Diagnosis present

## 2021-10-23 DIAGNOSIS — E782 Mixed hyperlipidemia: Secondary | ICD-10-CM | POA: Diagnosis not present

## 2021-10-23 DIAGNOSIS — I10 Essential (primary) hypertension: Secondary | ICD-10-CM

## 2021-10-23 DIAGNOSIS — C3411 Malignant neoplasm of upper lobe, right bronchus or lung: Secondary | ICD-10-CM | POA: Insufficient documentation

## 2021-10-23 DIAGNOSIS — I70221 Atherosclerosis of native arteries of extremities with rest pain, right leg: Secondary | ICD-10-CM | POA: Diagnosis not present

## 2021-10-23 DIAGNOSIS — Z510 Encounter for antineoplastic radiation therapy: Secondary | ICD-10-CM | POA: Diagnosis not present

## 2021-10-24 DIAGNOSIS — Z510 Encounter for antineoplastic radiation therapy: Secondary | ICD-10-CM | POA: Diagnosis not present

## 2021-10-29 ENCOUNTER — Ambulatory Visit
Admission: RE | Admit: 2021-10-29 | Discharge: 2021-10-29 | Disposition: A | Discharge status: Home / Self Care | Payer: Medicare Other | Source: Ambulatory Visit | Attending: Radiation Oncology | Admitting: Radiation Oncology

## 2021-10-29 DIAGNOSIS — Z510 Encounter for antineoplastic radiation therapy: Secondary | ICD-10-CM | POA: Diagnosis not present

## 2021-11-01 ENCOUNTER — Inpatient Hospital Stay (HOSPITAL_BASED_OUTPATIENT_CLINIC_OR_DEPARTMENT_OTHER): Payer: Medicare Other | Admitting: Oncology

## 2021-11-01 ENCOUNTER — Inpatient Hospital Stay: Payer: Medicare Other | Attending: Oncology

## 2021-11-01 ENCOUNTER — Encounter: Payer: Self-pay | Admitting: Oncology

## 2021-11-01 ENCOUNTER — Inpatient Hospital Stay: Payer: Medicare Other

## 2021-11-01 VITALS — BP 148/89 | HR 73 | Temp 97.2°F | Resp 16 | Wt 196.8 lb

## 2021-11-01 DIAGNOSIS — Z79890 Hormone replacement therapy (postmenopausal): Secondary | ICD-10-CM | POA: Insufficient documentation

## 2021-11-01 DIAGNOSIS — Z5112 Encounter for antineoplastic immunotherapy: Secondary | ICD-10-CM | POA: Diagnosis present

## 2021-11-01 DIAGNOSIS — I739 Peripheral vascular disease, unspecified: Secondary | ICD-10-CM | POA: Insufficient documentation

## 2021-11-01 DIAGNOSIS — C7931 Secondary malignant neoplasm of brain: Secondary | ICD-10-CM | POA: Insufficient documentation

## 2021-11-01 DIAGNOSIS — C3491 Malignant neoplasm of unspecified part of right bronchus or lung: Secondary | ICD-10-CM

## 2021-11-01 DIAGNOSIS — I743 Embolism and thrombosis of arteries of the lower extremities: Secondary | ICD-10-CM | POA: Insufficient documentation

## 2021-11-01 DIAGNOSIS — Z7901 Long term (current) use of anticoagulants: Secondary | ICD-10-CM | POA: Insufficient documentation

## 2021-11-01 DIAGNOSIS — E032 Hypothyroidism due to medicaments and other exogenous substances: Secondary | ICD-10-CM

## 2021-11-01 DIAGNOSIS — Z923 Personal history of irradiation: Secondary | ICD-10-CM | POA: Insufficient documentation

## 2021-11-01 DIAGNOSIS — C3411 Malignant neoplasm of upper lobe, right bronchus or lung: Secondary | ICD-10-CM | POA: Insufficient documentation

## 2021-11-01 LAB — COMPREHENSIVE METABOLIC PANEL
ALT: 29 U/L (ref 0–44)
AST: 21 U/L (ref 15–41)
Albumin: 3.6 g/dL (ref 3.5–5.0)
Alkaline Phosphatase: 64 U/L (ref 38–126)
Anion gap: 6 (ref 5–15)
BUN: 27 mg/dL — ABNORMAL HIGH (ref 8–23)
CO2: 26 mmol/L (ref 22–32)
Calcium: 8.4 mg/dL — ABNORMAL LOW (ref 8.9–10.3)
Chloride: 104 mmol/L (ref 98–111)
Creatinine, Ser: 1.01 mg/dL (ref 0.61–1.24)
GFR, Estimated: >60 mL/min (ref >60)
Glucose, Bld: 106 mg/dL — ABNORMAL HIGH (ref 70–99)
Potassium: 3.8 mmol/L (ref 3.5–5.1)
Sodium: 136 mmol/L (ref 135–145)
Total Bilirubin: 0.7 mg/dL (ref 0.3–1.2)
Total Protein: 6.3 g/dL — ABNORMAL LOW (ref 6.5–8.1)

## 2021-11-01 LAB — CBC WITH DIFFERENTIAL/PLATELET
Abs Immature Granulocytes: 0.03 10*3/uL (ref 0.00–0.07)
Basophils Absolute: 0 10*3/uL (ref 0.0–0.1)
Basophils Relative: 0 %
Eosinophils Absolute: 0 10*3/uL (ref 0.0–0.5)
Eosinophils Relative: 0 %
HCT: 35.7 % — ABNORMAL LOW (ref 39.0–52.0)
Hemoglobin: 11.7 g/dL — ABNORMAL LOW (ref 13.0–17.0)
Immature Granulocytes: 1 %
Lymphocytes Relative: 12 %
Lymphs Abs: 0.7 10*3/uL (ref 0.7–4.0)
MCH: 29.7 pg (ref 26.0–34.0)
MCHC: 32.8 g/dL (ref 30.0–36.0)
MCV: 90.6 fL (ref 80.0–100.0)
Monocytes Absolute: 0.6 10*3/uL (ref 0.1–1.0)
Monocytes Relative: 9 %
Neutro Abs: 4.9 10*3/uL (ref 1.7–7.7)
Neutrophils Relative %: 78 %
Platelets: 111 10*3/uL — ABNORMAL LOW (ref 150–400)
RBC: 3.94 MIL/uL — ABNORMAL LOW (ref 4.22–5.81)
RDW: 15.3 % (ref 11.5–15.5)
WBC: 6.3 10*3/uL (ref 4.0–10.5)
nRBC: 0 % (ref 0.0–0.2)

## 2021-11-01 LAB — TSH: TSH: 17.996 u[IU]/mL — ABNORMAL HIGH (ref 0.350–4.500)

## 2021-11-01 LAB — T4, FREE: Free T4: 0.65 ng/dL (ref 0.61–1.12)

## 2021-11-01 MED ORDER — HEPARIN SOD (PORK) LOCK FLUSH 100 UNIT/ML IV SOLN
500.0000 [IU] | Freq: Once | INTRAVENOUS | Status: AC | PRN
  Administered 2021-11-01: 500 [IU]
  Filled 2021-11-01: qty 5

## 2021-11-01 MED ORDER — SODIUM CHLORIDE 0.9 % IV SOLN
Freq: Once | INTRAVENOUS | Status: AC
  Filled 2021-11-01: qty 250

## 2021-11-01 MED ORDER — SODIUM CHLORIDE 0.9 % IV SOLN
10.0000 mg/kg | Freq: Once | INTRAVENOUS | Status: AC
  Administered 2021-11-01: 860 mg via INTRAVENOUS
  Filled 2021-11-01: qty 10

## 2021-11-01 NOTE — Progress Notes (Signed)
Pt has no new concerns today. ?

## 2021-11-01 NOTE — Progress Notes (Signed)
?Hematology/Oncology Progress note ?Telephone:(336) [phone] [phone] ?  ? ? ?Patient Care Team: ?[name], MD as PCP - General (Physician Assistant) ?[name], MD as Referring Physician (Physician Assistant) ?Robert Bellow, MD (General Surgery) ?Telford Nab, RN as Sales executive ? ?REFERRING PROVIDER: ?[name], MD  ?CHIEF COMPLAINTS/REASON FOR VISIT:  ?Follow up for lung cancer ? ?HISTORY OF PRESENTING ILLNESS:  ? ?Joshua [name] is a  66 y.o.  male with PMH listed below was seen in consultation at the request of  [name], MD  for evaluation of lung mass ? ?Patient presented to emergency room on 08/25/2020 for evaluation of hemoptysis, coughing.  He coughed up small amount of blood and a large clot.  No fever, chills, chest pain, abdominal pain.  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  Patient is on Plavix and Eliquis.  Since his ER visit, he has not coughed up more blood.  ?08/25/2020 CT angio chest PE with and without contrast showed ill-defined soft tissue adjacent to the right upper lobe bronchus resulting in narrowing of right upper lobe bronchus.  Also right paratracheal mediastinal adenopathy.  Scattered centrilobular groundglass opacities within the right lower lobe and to a lesser extent within the right lower lobe likely reflecting an infectious or inflammatory process. ?# 09/14/2020, right upper lobe lung biopsy and bronchoscopy brushing are both positive for squamous cell carcinoma.Right precarinal space lymph node insufficient lymph node sampling for staging. ?09/18/2020, PET scan showed right upper lobe nodule maximal SUV 11.5.  Hypermetabolic cluster right paratracheal adenopathy with maximum SUV up to 12.  No findings of metastatic disease to the neck/abdomen/pelvis or skeleton. ? ?#  10/03/2000, MRI brain with and without contrast showed no evidence of intracranial metastatic disease. ?Well-defined round lesion within the left parotid tail measuring ?approximately 2.3 x 1.8 x 1.8 cm ?# Mediport placed by vascular surgeon.  ?# - NGS showed PD-L1 TPS 95%, LRP1B S963fs, PIK3CA E545K, RB1 c1422-2A>T, RIT1 T83_ A84del, STK11 P275fs, TPS53 R248W, TMB 19.56mut/mb, MS stable.  ? 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  Management per ENT  ? ?#10/10/2020 started concurrent chemoradiation.  Patient received 4 cycles of carboplatin AUC of 2 and Taxol 45 mg per metered square  Additional chemotherapy was held due to thrombocytopenia, neutropenia ?Patient finished radiation on 11/27/2020 ? ?#12/31/2020 -03/21/2021, Durvalumab maintenance ? ?03/28/2021, CT without contrast showed new linear right perihilar consolidation with associated traction bronchiectasis.  Likely postradiation changes.  Additional new peribronchial vascular/nodular groundglass opacities with areas of consolidation or seen primarily in the right upper and lower lobes, more distant from expected radiation fields.  Findings are possibly due to radiation-induced organizing pneumonia.  Or superimposed drug toxicity.  Recommend follow-up CT chest in 1 to 2 months for assessment of evaluation. ? 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  Patient was seen by pulmonology Dr. Patsey Berthold. ?Per my discussion with pulmonology Dr. 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  Recent CT showed improvement. ? ?05/09/2021, repeat CT chest without contrast showed evolving radiation changes in the right perihilar region likely accounting for interval increase to thickening of the posterior wall of the right upper lobe bronchus.  The more peripheral right lung groundglass opacities seen on most recent study have generally improved with exception of one peripherally right upper lobe which appears new.  These  are likely inflammatory.  No findings highly suspicious for local recurrence. ? ?05/30/2021 resumed on Durvalumab.  ?Dr. Patsey Berthold switched inhaler to  [facility] as he is not able to tolerate LABA/LAMA combination. ? ?#Left-sided parotid nodule, patient follows up with ENT.  Nodule was biopsied and pathology was not diagnostic, acellular debris's with amylase crystalloids.#Left-sided parotid nodule, patient follows up with ENT.  Nodule was biopsied and pathology was not diagnostic, acellular debris's with amylase crystalloids. ? ?Patient has severe peripheral artery disease followed by Dr. Delana Meyer.  He was on chronic anticoagulation with Eliquis and Plavix.  Anticoagulation was held temporarily for colonoscopy on 09/09/2021.  After colonoscopy, he developed right lower extremity rest pain, concerning of limb threatening ischemia.   ?09/25/2021, right lower extremity angiogram showed thrombosis of previous bypass grafts, atherosclerotic occlusive disease, with continuous thrombolytic therapy overnight.  09/26/2021, repeat angiogram and revascularization. ?Patient also reports that he started to have headache after colonoscopy, progressive difficulty using her right arm.  Progressively worsening of speech. ?10/07/2021, MRI brain with and without contrast showed 2 contrast-enhancing lesions, most consistent with metastatic disease.  Largest lesion is in the left frontal lobe and measures up to 1.3 cm with a large amount of surrounding edema and 9 mm rightward midline shift.  The other enhancing lesion was 4 mm in the right occipital lobe with a small amount of surrounding edema.  No acute or chronic hemorrhage. ? ?Patient was started on Dexamethasone.  He was seen by neurosurgery and radiation oncology.  Recommendation is to proceed with SRS.  Patient was discharged with dexamethasone. ? ?10/16/2021 PET scan showed postradiation changes in the right supra hilar lung without evidence of local recurrence.  No evidence of lung  cancer recurrence or metastatic disease.  Single focus of intense metabolic activity associated with posterior left 11th rib likely secondary to recent fall/posttraumatic metabolic changes.  Frontal  uptake in the left frontal lobe corresponding to the known metastatic lesion in the brain. ?PET scan results were reviewed and discussed with patient. No peripheral metastatic disease.  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  Patient understands that his condition is not curable.  Treatment is with palliative intent. ? ? ?INTERVAL HISTORY ?Joshua [name] is a 66 y.o. male who has above history reviewed by me today presents for follow up visit for Stage III lung squamous cell carcinoma. ?Finished SRS.  ?Dexamethasone has been tapered down to $Remov'8mg'nCYeAB$  daily for this week. He reports feeling great. Right upper extremity weakness has resolved. Strength is back to his baseline.  ? ? ?Review of Systems  ?Constitutional:  Positive for fatigue. Negative for appetite change, chills, fever and unexpected weight change.  ?HENT:   Negative for hearing loss and voice change.   ?Eyes:  Negative for eye problems and icterus.  ?Respiratory:  Negative for chest tightness, cough and shortness of breath.   ?Cardiovascular:  Negative for chest pain and leg swelling.  ?Gastrointestinal:  Negative for abdominal distention and abdominal pain.  ?Endocrine: Negative for hot flashes.  ?Genitourinary:  Negative for difficulty urinating, dysuria and frequency.   ?Musculoskeletal:  Negative for arthralgias.  ?Skin:  Negative for itching and rash.  ?Neurological:  Negative for light-headedness and numbness.  ?     Right upper extremity strength has improved.  ?Hematological:  Negative for adenopathy. Does not bruise/bleed easily.  ?Psychiatric/Behavioral:  Negative for confusion.   ?  ? ?MEDICAL HISTORY:  ?Past Medical History:  ?Diagnosis Date  ? Allergy   ? Cancer [facility])   ?  lung  ? Dyspnea   ? former smoker. only doe  ? History  of kidney stones   ? Hypertension   ? Peripheral vascular disease ([place])   ? Pre-diabetes   ? Sleep apnea   ? ? ?SURGICAL HISTORY: ?Past Surgical History:  ?Procedure Laterality Date  ? ANTERIOR CERVICAL DECOMP/DIS

## 2021-11-03 ENCOUNTER — Encounter (INDEPENDENT_AMBULATORY_CARE_PROVIDER_SITE_OTHER): Payer: Self-pay | Admitting: Nurse Practitioner

## 2021-11-03 NOTE — Progress Notes (Signed)
? ?Subjective:  ? ? Patient ID: Joshua [name], male    DOB: [birth year], 66 y.o.   MRN: [id number] ?Chief Complaint  ?Patient presents with  ? Follow-up  ?  [facility] 3 weeks follow up  ? ? ?The patient returns to the office for followup and review status post angiogram with intervention on 09/26/2021.  ? ?Procedure: ?Procedure(s) Performed: ?            1.  Right lower extremity angiogram ?            2.  Percutaneous transluminal angioplasty of the right posterior tibial artery with 2.5 mm diameter by 30 cm length angioplasty balloon ?            3.  Percutaneous transluminal angioplasty of the distal bypass anastomosis and distal bypass graft analogous to the popliteal artery with 4 mm diameter by 8 cm length Lutonix drug-coated angioplasty balloon ?            4. StarClose closure device left femoral artery ? ?The patient notes improvement in the lower extremity symptoms. No interval shortening of the patient's claudication distance or rest pain symptoms. No new ulcers or wounds have occurred since the last visit. ? ?There have been no significant changes to the patient's overall health care. ? ?No documented history of amaurosis fugax or recent TIA symptoms. There are no recent neurological changes noted. ?No documented history of DVT, PE or superficial thrombophlebitis. ?The patient denies recent episodes of angina or shortness of breath.  ? ?ABI's Rt=1.06 and Lt=1.33  (previous ABI's Rt=1.09 and Lt=1.10) ?Duplex US of the bilateral tibial arteries lower extremity arterial system shows triphasic waveforms with normal toe waveforms. ? ? ?Review of Systems  ?Skin:  Positive for wound.  ?All other systems reviewed and are negative. ? ?   ?Objective:  ? Physical Exam ?Vitals reviewed.  ?HENT:  ?   Head: Normocephalic.  ?Cardiovascular:  ?   Rate and Rhythm: Normal rate.  ?   Pulses:     ?     Dorsalis pedis pulses are detected w/ Doppler on the right side and detected w/ Doppler on the left side.  ?     Posterior  tibial pulses are detected w/ Doppler on the right side and detected w/ Doppler on the left side.  ?Pulmonary:  ?   Effort: Pulmonary effort is normal.  ?Skin: ?   General: Skin is warm and dry.  ?Neurological:  ?   Mental Status: He is alert and oriented to person, place, and time.  ?   Gait: Gait abnormal.  ?Psychiatric:     ?   Mood and Affect: Mood normal.     ?   Behavior: Behavior normal.     ?   Thought Content: Thought content normal.     ?   Judgment: Judgment normal.  ? ? ?BP (!) 158/80 (BP Location: Right Arm)   Pulse 80   Resp 16   Wt 196 lb (88.9 kg)   BMI 27.34 kg/m?  ? ?Past Medical History:  ?Diagnosis Date  ? Allergy   ? Cancer [facility])   ? lung  ? Dyspnea   ? former smoker. only doe  ? History of kidney stones   ? Hypertension   ? Peripheral vascular disease ([place])   ? Pre-diabetes   ? Sleep apnea   ? ? ?Social History  ? ?Socioeconomic History  ? Marital status: Single  ?  Spouse name: Not on  file  ? Number of children: Not on file  ? Years of education: Not on file  ? Highest education level: Not on file  ?Occupational History  ? Occupation: unemployed  ?Tobacco Use  ? Smoking status: Former  ?  Packs/day: 2.00  ?  Years: 38.00  ?  Pack years: 76.00  ?  Types: Cigarettes  ?  Quit date: 12/20/2014  ?  Years since quitting: 6.8  ? Smokeless tobacco: Never  ? Tobacco comments:  ?  quit smoking in 2017 most smoked 2   ?Vaping Use  ? Vaping Use: Former  ?Substance and Sexual Activity  ? Alcohol use: Not Currently  ? Drug use: No  ? Sexual activity: Yes  ?Other Topics Concern  ? Not on file  ?Social History Narrative  ? Not on file  ? ?Social Determinants of Health  ? ?Financial Resource Strain: Not on file  ?Food Insecurity: Not on file  ?Transportation Needs: Not on file  ?Physical Activity: Not on file  ?Stress: Not on file  ?Social Connections: Not on file  ?Intimate Partner Violence: Not on file  ? ? ?Past Surgical History:  ?Procedure Laterality Date  ? ANTERIOR CERVICAL DECOMP/DISCECTOMY FUSION  N/A 03/14/2020  ? Procedure: ANTERIOR CERVICAL DECOMPRESSION/DISCECTOMY FUSION 3 LEVELS C4-7;  Surgeon: Meade Maw, MD;  Location: [facility] ORS;  Service: Neurosurgery;  Laterality: N/A;  ? BACK SURGERY  2011  ? CENTRAL LINE INSERTION Right 09/10/2016  ? Procedure: CENTRAL LINE INSERTION;  Surgeon: Delana Meyer Dolores Lory, MD;  Location: [facility] ORS;  Service: Vascular;  Laterality: Right;  ? CERVICAL FUSION    ? C4-c7 on Mar 14, 2020  ? COLONOSCOPY  2013 ?  ? COLONOSCOPY N/A 09/09/2021  ? Procedure: COLONOSCOPY;  Surgeon: [name], DO;  Location[facility] END[facility]COPY;  Service: Gastroenterology;  Laterality: N/A;  ? CORONARY ANGIOPLASTY    ? ESOPHAGOGASTRODUODENOSCOPY N/A 09/09/2021  ? Procedure: ESOPHAGOGASTRODUODENOSCOPY (EGD);  Surgeon: [name], DO;  Location: [facility] ENDOSCOPY;  Service: Gastroenterology;  Laterality: N/A;  ? FEMORAL-POPLITEAL BYPASS GRAFT Right 09/10/2016  ? Procedure: BYPASS GRAFT FEMORAL-POPLITEAL ARTERY ( BELOW KNEE );  Surgeon: Katha Cabal, MD;  Location: [facility] ORS;  Service: Vascular;  Laterality: Right;  ? FEMORAL-TIBIAL BYPASS GRAFT Right 03/23/2020  ? Procedure: BYPASS GRAFT RIGHT FEMORAL- DISTAL TIBIAL ARTERY WITH DISTAL FLOW GRAFT;  Surgeon: Katha Cabal, MD;  Location: [facility] ORS;  Service: Vascular;  Laterality: Right;  ? LEFT HEART CATH AND CORONARY ANGIOGRAPHY Left 05/16/2021  ? Procedure: LEFT HEART CATH AND CORONARY ANGIOGRAPHY;  Surgeon: Andrez Grime, MD;  Location: [place] CV LAB;  Service: Cardiovascular;  Laterality: Left;  ? LOWER EXTREMITY ANGIOGRAPHY Right 11/18/2016  ? Procedure: Lower Extremity Angiography;  Surgeon: Katha Cabal, MD;  Location: [place] CV LAB;  Service: Cardiovascular;  Laterality: Right;  ? LOWER EXTREMITY ANGIOGRAPHY Right 12/09/2016  ? Procedure: Lower Extremity Angiography;  Surgeon: Katha Cabal, MD;  Location: [place] CV LAB;  Service: Cardiovascular;  Laterality: Right;  ? LOWER EXTREMITY  ANGIOGRAPHY Right 11/15/2019  ? Procedure: LOWER EXTREMITY ANGIOGRAPHY;  Surgeon: Katha Cabal, MD;  Location: [place] CV LAB;  Service: Cardiovascular;  Laterality: Right;  ? LOWER EXTREMITY ANGIOGRAPHY Right 12/01/2019  ? Procedure: Lower Extremity Angiography;  Surgeon: Algernon Huxley, MD;  Location: [place] CV LAB;  Service: Cardiovascular;  Laterality: Right;  ? LOWER EXTREMITY ANGIOGRAPHY Right 12/02/2019  ? Procedure: LOWER EXTREMITY ANGIOGRAPHY;  Surgeon: Katha Cabal, MD;  Location:  [place] CV LAB;  Service: Cardiovascular;  Laterality: Right;  ? LOWER EXTREMITY ANGIOGRAPHY Right 03/23/2020  ? Procedure: Lower Extremity Angiography;  Surgeon: Katha Cabal, MD;  Location: [place] CV LAB;  Service: Cardiovascular;  Laterality: Right;  ? LOWER EXTREMITY ANGIOGRAPHY Right 09/25/2021  ? Procedure: Lower Extremity Angiography;  Surgeon: Katha Cabal, MD;  Location: [place] CV LAB;  Service: Cardiovascular;  Laterality: Right;  ? LOWER EXTREMITY ANGIOGRAPHY Right 09/26/2021  ? Procedure: Lower Extremity Angiography;  Surgeon: Algernon Huxley, MD;  Location: [place] CV LAB;  Service: Cardiovascular;  Laterality: Right;  ? LOWER EXTREMITY INTERVENTION  11/18/2016  ? Procedure: Lower Extremity Intervention;  Surgeon: Katha Cabal, MD;  Location: [place] CV LAB;  Service: Cardiovascular;;  ? PERIPHERAL VASCULAR CATHETERIZATION Right 01/02/2015  ? Procedure: Lower Extremity Angiography;  Surgeon: Katha Cabal, MD;  Location: [place] CV LAB;  Service: Cardiovascular;  Laterality: Right;  ? PERIPHERAL VASCULAR CATHETERIZATION Right 06/18/2015  ? Procedure: Lower Extremity Angiography;  Surgeon: Algernon Huxley, MD;  Location: [place] CV LAB;  Service: Cardiovascular;  Laterality: Right;  ? PERIPHERAL VASCULAR CATHETERIZATION  06/18/2015  ? Procedure: Lower Extremity Intervention;  Surgeon: Algernon Huxley, MD;  Location: [place] CV LAB;  Service:  Cardiovascular;;  ? PERIPHERAL VASCULAR CATHETERIZATION N/A 10/09/2015  ? Procedure: Abdominal Aortogram w/Lower Extremity;  Surgeon: Katha Cabal, MD;  Location: [place] CV LAB;  Service: Cardiovascular;  Late

## 2021-11-15 ENCOUNTER — Inpatient Hospital Stay (HOSPITAL_BASED_OUTPATIENT_CLINIC_OR_DEPARTMENT_OTHER): Payer: Medicare Other | Admitting: Oncology

## 2021-11-15 ENCOUNTER — Encounter: Payer: Self-pay | Admitting: Oncology

## 2021-11-15 ENCOUNTER — Inpatient Hospital Stay: Payer: Medicare Other

## 2021-11-15 VITALS — BP 139/89 | HR 95 | Temp 97.3°F | Ht 71.0 in | Wt 200.0 lb

## 2021-11-15 DIAGNOSIS — Z5112 Encounter for antineoplastic immunotherapy: Secondary | ICD-10-CM | POA: Diagnosis not present

## 2021-11-15 DIAGNOSIS — E032 Hypothyroidism due to medicaments and other exogenous substances: Secondary | ICD-10-CM

## 2021-11-15 DIAGNOSIS — C7931 Secondary malignant neoplasm of brain: Secondary | ICD-10-CM | POA: Diagnosis not present

## 2021-11-15 DIAGNOSIS — C3491 Malignant neoplasm of unspecified part of right bronchus or lung: Secondary | ICD-10-CM

## 2021-11-15 DIAGNOSIS — I743 Embolism and thrombosis of arteries of the lower extremities: Secondary | ICD-10-CM

## 2021-11-15 LAB — CBC WITH DIFFERENTIAL/PLATELET
Abs Immature Granulocytes: 0.03 10*3/uL (ref 0.00–0.07)
Basophils Absolute: 0 10*3/uL (ref 0.0–0.1)
Basophils Relative: 0 %
Eosinophils Absolute: 0 10*3/uL (ref 0.0–0.5)
Eosinophils Relative: 1 %
HCT: 36 % — ABNORMAL LOW (ref 39.0–52.0)
Hemoglobin: 11.7 g/dL — ABNORMAL LOW (ref 13.0–17.0)
Immature Granulocytes: 1 %
Lymphocytes Relative: 18 %
Lymphs Abs: 1 10*3/uL (ref 0.7–4.0)
MCH: 29.7 pg (ref 26.0–34.0)
MCHC: 32.5 g/dL (ref 30.0–36.0)
MCV: 91.4 fL (ref 80.0–100.0)
Monocytes Absolute: 0.5 10*3/uL (ref 0.1–1.0)
Monocytes Relative: 9 %
Neutro Abs: 4.1 10*3/uL (ref 1.7–7.7)
Neutrophils Relative %: 71 %
Platelets: 144 10*3/uL — ABNORMAL LOW (ref 150–400)
RBC: 3.94 MIL/uL — ABNORMAL LOW (ref 4.22–5.81)
RDW: 15.4 % (ref 11.5–15.5)
WBC: 5.7 10*3/uL (ref 4.0–10.5)
nRBC: 0 % (ref 0.0–0.2)

## 2021-11-15 LAB — COMPREHENSIVE METABOLIC PANEL
ALT: 27 U/L (ref 0–44)
AST: 18 U/L (ref 15–41)
Albumin: 3.4 g/dL — ABNORMAL LOW (ref 3.5–5.0)
Alkaline Phosphatase: 54 U/L (ref 38–126)
Anion gap: 5 (ref 5–15)
BUN: 25 mg/dL — ABNORMAL HIGH (ref 8–23)
CO2: 27 mmol/L (ref 22–32)
Calcium: 8.5 mg/dL — ABNORMAL LOW (ref 8.9–10.3)
Chloride: 105 mmol/L (ref 98–111)
Creatinine, Ser: 0.92 mg/dL (ref 0.61–1.24)
GFR, Estimated: >60 mL/min (ref >60)
Glucose, Bld: 110 mg/dL — ABNORMAL HIGH (ref 70–99)
Potassium: 3.8 mmol/L (ref 3.5–5.1)
Sodium: 137 mmol/L (ref 135–145)
Total Bilirubin: 0.6 mg/dL (ref 0.3–1.2)
Total Protein: 6.6 g/dL (ref 6.5–8.1)

## 2021-11-15 MED ORDER — HEPARIN SOD (PORK) LOCK FLUSH 100 UNIT/ML IV SOLN
500.0000 [IU] | Freq: Once | INTRAVENOUS | Status: AC | PRN
  Filled 2021-11-15: qty 5

## 2021-11-15 MED ORDER — LEVOTHYROXINE SODIUM 88 MCG PO TABS: 88.0000 ug | ORAL_TABLET | Freq: Every day | ORAL | 2 refills | Status: DC

## 2021-11-15 MED ORDER — SODIUM CHLORIDE 0.9 % IV SOLN
Freq: Once | INTRAVENOUS | Status: AC
  Filled 2021-11-15: qty 250

## 2021-11-15 MED ORDER — HEPARIN SOD (PORK) LOCK FLUSH 100 UNIT/ML IV SOLN
INTRAVENOUS | Status: AC
  Administered 2021-11-15: 500 [IU]
  Filled 2021-11-15: qty 5

## 2021-11-15 MED ORDER — SODIUM CHLORIDE 0.9 % IV SOLN
10.0000 mg/kg | Freq: Once | INTRAVENOUS | Status: AC
  Administered 2021-11-15: 860 mg via INTRAVENOUS
  Filled 2021-11-15: qty 7.2

## 2021-11-15 NOTE — Patient Instructions (Signed)
[facility] CANCER CTR AT [place]  Discharge Instructions: ?Thank you for choosing [facility] to provide your oncology and hematology care.  ?If you have a lab appointment with the [facility], please go directly to the [facility] and check in at the registration area. ? ?Wear comfortable clothing and clothing appropriate for easy access to any Portacath or PICC line.  ? ?We strive to give you quality time with your provider. You may need to reschedule your appointment if you arrive late (15 or more minutes).  Arriving late affects you and other patients whose appointments are after yours.  Also, if you miss three or more appointments without notifying the office, you may be dismissed from the clinic at the provider?s discretion.    ?  ?For prescription refill requests, have your pharmacy contact our office and allow 72 hours for refills to be completed.   ? ?Today you received the following chemotherapy and/or immunotherapy agents Imfinzi    ?  ?To help prevent nausea and vomiting after your treatment, we encourage you to take your nausea medication as directed. ? ?BELOW ARE SYMPTOMS THAT SHOULD BE REPORTED IMMEDIATELY: ?*FEVER GREATER THAN 100.4 F (38 ?C) OR HIGHER ?*CHILLS OR SWEATING ?*NAUSEA AND VOMITING THAT IS NOT CONTROLLED WITH YOUR NAUSEA MEDICATION ?*UNUSUAL SHORTNESS OF BREATH ?*UNUSUAL BRUISING OR BLEEDING ?*URINARY PROBLEMS (pain or burning when urinating, or frequent urination) ?*BOWEL PROBLEMS (unusual diarrhea, constipation, pain near the anus) ?TENDERNESS IN MOUTH AND THROAT WITH OR WITHOUT PRESENCE OF ULCERS (sore throat, sores in mouth, or a toothache) ?UNUSUAL RASH, SWELLING OR PAIN  ?UNUSUAL VAGINAL DISCHARGE OR ITCHING  ? ?Items with * indicate a potential emergency and should be followed up as soon as possible or go to the Emergency Department if any problems should occur. ? ?Please show the CHEMOTHERAPY ALERT CARD or IMMUNOTHERAPY ALERT CARD at check-in to the  Emergency Department and triage nurse. ? ?Should you have questions after your visit or need to cancel or reschedule your appointment, please contact [facility] CANCER [place] AT [place]  [phone] and follow the prompts.  Office hours are 8:00 a.m. to 4:30 p.m. Monday - Friday. Please note that voicemails left after 4:00 p.m. may not be returned until the following business day.  We are closed weekends and major holidays. You have access to a nurse at all times for urgent questions. Please call the main number to the clinic [phone] and follow the prompts. ? ?For any non-urgent questions, you may also contact your provider using MyChart. We now offer e-Visits for anyone 20 and older to request care online for non-urgent symptoms. For details visit [email or web address]. ?  ?Also download the MyChart app! Go to the app store, search "MyChart", open the app, select [facility], and log in with your MyChart username and password. ? ?Due to Covid, a mask is required upon entering the hospital/clinic. If you do not have a mask, one will be given to you upon arrival. For doctor visits, patients may have 1 support person aged 64 or older with them. For treatment visits, patients cannot have anyone with them due to current Covid guidelines and our immunocompromised population.  ?

## 2021-11-15 NOTE — Progress Notes (Signed)
Patient approved one-time $1000 [place] and qualifications to assist with personal expenses while undergoing treatment. ?  ?Patient provided [place] agreement.  Email and letter sent to patient with information on expenses and provided patient my contact information for any additional questions.  ?

## 2021-11-15 NOTE — Progress Notes (Signed)
?Hematology/Oncology Progress note ?[phone] [phone] ?  ? ? ?Patient Care Team: ?Joshua Elk, MD as PCP - General (Physician Assistant) ?Joshua Elk, MD as Referring Physician (Physician Assistant) ?Joshua Bellow, MD (General Surgery) ?Joshua Nab, RN as Sales executive ? ?REFERRING PROVIDER: ?Joshua Elk, MD  ?CHIEF COMPLAINTS/REASON FOR VISIT:  ?Follow up for lung cancer ? ?HISTORY OF PRESENTING ILLNESS:  ? ?Joshua [name] is a  66 y.o.  male with PMH listed below was seen in consultation at the request of  Joshua Elk, MD  for evaluation of lung mass ? ?Patient presented to emergency room on 08/25/2020 for evaluation of hemoptysis, coughing.  He coughed up small amount of blood and a large clot.  No fever, chills, chest pain, abdominal pain.  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  Patient is on Plavix and Eliquis.  Since his ER visit, he has not coughed up more blood.  ?08/25/2020 CT angio chest PE with and without contrast showed ill-defined soft tissue adjacent to the right upper lobe bronchus resulting in narrowing of right upper lobe bronchus.  Also right paratracheal mediastinal adenopathy.  Scattered centrilobular groundglass opacities within the right lower lobe and to a lesser extent within the right lower lobe likely reflecting an infectious or inflammatory process. ?# 09/14/2020, right upper lobe lung biopsy and bronchoscopy brushing are both positive for squamous cell carcinoma.Right precarinal space lymph node insufficient lymph node sampling for staging. ?09/18/2020, PET scan showed right upper lobe nodule maximal SUV 11.5.  Hypermetabolic cluster right paratracheal adenopathy with maximum SUV up to 12.  No findings of metastatic disease to the neck/abdomen/pelvis or skeleton. ? ?#  10/03/2000, MRI brain with and without contrast showed no evidence of intracranial metastatic disease. ?Well-defined round lesion within the left parotid tail measuring ?approximately 2.3 x 1.8 x 1.8 cm ?# Mediport placed by vascular surgeon.  ?# - NGS showed PD-L1 TPS 95%, LRP1B S964fs, PIK3CA E545K, RB1 c1422-2A>T, RIT1 T83_ A84del, STK11 P273fs, TPS53 R248W, TMB 19.69mut/mb, MS stable.  ? 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  Management per ENT  ? ?#10/10/2020 started concurrent chemoradiation.  Patient received 4 cycles of carboplatin AUC of 2 and Taxol 45 mg per metered square  Additional chemotherapy was held due to thrombocytopenia, neutropenia ?Patient finished radiation on 11/27/2020 ? ?#12/31/2020 -03/21/2021, Durvalumab maintenance ? ?03/28/2021, CT without contrast showed new linear right perihilar consolidation with associated traction bronchiectasis.  Likely postradiation changes.  Additional new peribronchial vascular/nodular groundglass opacities with areas of consolidation or seen primarily in the right upper and lower lobes, more distant from expected radiation fields.  Findings are possibly due to radiation-induced organizing pneumonia.  Or superimposed drug toxicity.  Recommend follow-up CT chest in 1 to 2 months for assessment of evaluation. ? 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  Patient was seen by pulmonology Joshua [name]. 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  Recent CT showed improvement. ? ?05/09/2021, repeat CT chest without contrast showed evolving radiation changes in the right perihilar region likely accounting for interval increase to thickening of the posterior wall of the right upper lobe bronchus.  The more peripheral right lung groundglass opacities seen on most recent study have generally improved with exception of one peripherally right upper lobe which appears new.  These  are likely inflammatory.  No findings highly suspicious for local recurrence. ? ?05/30/2021 resumed on Durvalumab.  ?Joshua [name] switched inhaler to  [facility] as he is not able to tolerate LABA/LAMA combination. ? ?#Left-sided parotid nodule, patient follows up with ENT.  Nodule was biopsied and pathology was not diagnostic, acellular debris's with amylase crystalloids.#Left-sided parotid nodule, patient follows up with ENT.  Nodule was biopsied and pathology was not diagnostic, acellular debris's with amylase crystalloids. ? ?Patient has severe peripheral artery disease followed by Joshua [name].  He was on chronic anticoagulation with Eliquis and Plavix.  Anticoagulation was held temporarily for colonoscopy on 09/09/2021.  After colonoscopy, he developed right lower extremity rest pain, concerning of limb threatening ischemia.   ?09/25/2021, right lower extremity angiogram showed thrombosis of previous bypass grafts, atherosclerotic occlusive disease, with continuous thrombolytic therapy overnight.  09/26/2021, repeat angiogram and revascularization. ?Patient also reports that he started to have headache after colonoscopy, progressive difficulty using her right arm.  Progressively worsening of speech. ?10/07/2021, MRI brain with and without contrast showed 2 contrast-enhancing lesions, most consistent with metastatic disease.  Largest lesion is in the left frontal lobe and measures up to 1.3 cm with a large amount of surrounding edema and 9 mm rightward midline shift.  The other enhancing lesion was 4 mm in the right occipital lobe with a small amount of surrounding edema.  No acute or chronic hemorrhage. ? ?Patient was started on Dexamethasone.  He was seen by neurosurgery and radiation oncology.  Recommendation is to proceed with SRS.  Patient was discharged with dexamethasone. ? ?10/16/2021 PET scan showed postradiation changes in the right supra hilar lung without evidence of local recurrence.  No evidence of lung  cancer recurrence or metastatic disease.  Single focus of intense metabolic activity associated with posterior left 11th rib likely secondary to recent fall/posttraumatic metabolic changes.  Frontal  uptake in the left frontal lobe corresponding to the known metastatic lesion in the brain. ?PET scan results were reviewed and discussed with patient. No peripheral metastatic disease.  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  Patient understands that his condition is not curable.  Treatment is with palliative intent. ? ? ?INTERVAL HISTORY ?Joshua [name] is a 66 y.o. male who has above history reviewed by me today presents for follow up visit for Stage III lung squamous cell carcinoma. ?Finished SRS.  ?Patient finished a tapering course of dexamethasone. ?He reports feeling well.  Right-sided muscle strength has resumed to close to baseline. ? ? ?Review of Systems  ?Constitutional:  Positive for fatigue. Negative for appetite change, chills, fever and unexpected weight change.  ?HENT:   Negative for hearing loss and voice change.   ?Eyes:  Negative for eye problems and icterus.  ?Respiratory:  Negative for chest tightness, cough and shortness of breath.   ?Cardiovascular:  Negative for chest pain and leg swelling.  ?Gastrointestinal:  Negative for abdominal distention and abdominal pain.  ?Endocrine: Negative for hot flashes.  ?Genitourinary:  Negative for difficulty urinating, dysuria and frequency.   ?Musculoskeletal:  Negative for arthralgias.  ?Skin:  Negative for itching and rash.  ?Neurological:  Negative for light-headedness and numbness.  ?     Right upper extremity strength has improved.  ?Hematological:  Negative for adenopathy. Does not bruise/bleed easily.  ?Psychiatric/Behavioral:  Negative for confusion.   ?  ? ?MEDICAL HISTORY:  ?Past Medical History:  ?Diagnosis Date  ? Allergy   ? Cancer [facility])   ? lung  ? Dyspnea   ?  former smoker. only doe  ? History of kidney stones   ?  Hypertension   ? Peripheral vascular disease ([place])   ? Pre-diabetes   ? Sleep apnea   ? ? ?SURGICAL HISTORY: ?Past Surgical History:  ?Procedure Laterality Date  ? ANTERIOR CERVICAL DECOMP/DISCECTOMY FUSION N/A 03/14/2020

## 2021-11-17 ENCOUNTER — Other Ambulatory Visit (INDEPENDENT_AMBULATORY_CARE_PROVIDER_SITE_OTHER): Payer: Self-pay | Admitting: Vascular Surgery

## 2021-11-17 ENCOUNTER — Other Ambulatory Visit: Payer: Self-pay | Admitting: Oncology

## 2021-11-27 NOTE — Telephone Encounter (Signed)
Comprehensive Metabolic Panel (CMP) ?Order: [id number] ? Ref Range & Units 9 d ago  ?Potassium 3.6 - 5.1 mmol/L 4.5   ? ?

## 2021-11-29 ENCOUNTER — Encounter: Payer: Self-pay | Admitting: Oncology

## 2021-11-29 ENCOUNTER — Inpatient Hospital Stay: Payer: Medicare Other | Attending: Oncology

## 2021-11-29 ENCOUNTER — Inpatient Hospital Stay: Payer: Medicare Other

## 2021-11-29 ENCOUNTER — Inpatient Hospital Stay (HOSPITAL_BASED_OUTPATIENT_CLINIC_OR_DEPARTMENT_OTHER): Payer: Medicare Other | Admitting: Oncology

## 2021-11-29 VITALS — BP 126/82 | HR 88 | Temp 97.5°F | Ht 71.0 in | Wt 198.0 lb

## 2021-11-29 DIAGNOSIS — Z7901 Long term (current) use of anticoagulants: Secondary | ICD-10-CM | POA: Insufficient documentation

## 2021-11-29 DIAGNOSIS — C3491 Malignant neoplasm of unspecified part of right bronchus or lung: Secondary | ICD-10-CM | POA: Insufficient documentation

## 2021-11-29 DIAGNOSIS — C7931 Secondary malignant neoplasm of brain: Secondary | ICD-10-CM | POA: Insufficient documentation

## 2021-11-29 DIAGNOSIS — I739 Peripheral vascular disease, unspecified: Secondary | ICD-10-CM | POA: Insufficient documentation

## 2021-11-29 DIAGNOSIS — Z79899 Other long term (current) drug therapy: Secondary | ICD-10-CM | POA: Diagnosis not present

## 2021-11-29 DIAGNOSIS — I743 Embolism and thrombosis of arteries of the lower extremities: Secondary | ICD-10-CM | POA: Diagnosis not present

## 2021-11-29 DIAGNOSIS — Z5112 Encounter for antineoplastic immunotherapy: Secondary | ICD-10-CM

## 2021-11-29 DIAGNOSIS — E032 Hypothyroidism due to medicaments and other exogenous substances: Secondary | ICD-10-CM

## 2021-11-29 LAB — COMPREHENSIVE METABOLIC PANEL
ALT: 25 U/L (ref 0–44)
AST: 26 U/L (ref 15–41)
Albumin: 3.7 g/dL (ref 3.5–5.0)
Alkaline Phosphatase: 72 U/L (ref 38–126)
Anion gap: 11 (ref 5–15)
BUN: 25 mg/dL — ABNORMAL HIGH (ref 8–23)
CO2: 24 mmol/L (ref 22–32)
Calcium: 8.6 mg/dL — ABNORMAL LOW (ref 8.9–10.3)
Chloride: 109 mmol/L (ref 98–111)
Creatinine, Ser: 1.43 mg/dL — ABNORMAL HIGH (ref 0.61–1.24)
GFR, Estimated: 55 mL/min — ABNORMAL LOW (ref >60)
Glucose, Bld: 130 mg/dL — ABNORMAL HIGH (ref 70–99)
Potassium: 3.9 mmol/L (ref 3.5–5.1)
Sodium: 144 mmol/L (ref 135–145)
Total Bilirubin: 0.6 mg/dL (ref 0.3–1.2)
Total Protein: 7.1 g/dL (ref 6.5–8.1)

## 2021-11-29 LAB — CBC WITH DIFFERENTIAL/PLATELET
Abs Immature Granulocytes: 0.03 10*3/uL (ref 0.00–0.07)
Basophils Absolute: 0 10*3/uL (ref 0.0–0.1)
Basophils Relative: 1 %
Eosinophils Absolute: 0.1 10*3/uL (ref 0.0–0.5)
Eosinophils Relative: 1 %
HCT: 36.3 % — ABNORMAL LOW (ref 39.0–52.0)
Hemoglobin: 11.7 g/dL — ABNORMAL LOW (ref 13.0–17.0)
Immature Granulocytes: 1 %
Lymphocytes Relative: 20 %
Lymphs Abs: 0.9 10*3/uL (ref 0.7–4.0)
MCH: 29.2 pg (ref 26.0–34.0)
MCHC: 32.2 g/dL (ref 30.0–36.0)
MCV: 90.5 fL (ref 80.0–100.0)
Monocytes Absolute: 0.5 10*3/uL (ref 0.1–1.0)
Monocytes Relative: 12 %
Neutro Abs: 2.8 10*3/uL (ref 1.7–7.7)
Neutrophils Relative %: 65 %
Platelets: 196 10*3/uL (ref 150–400)
RBC: 4.01 MIL/uL — ABNORMAL LOW (ref 4.22–5.81)
RDW: 14.8 % (ref 11.5–15.5)
WBC: 4.3 10*3/uL (ref 4.0–10.5)
nRBC: 0.5 % — ABNORMAL HIGH (ref 0.0–0.2)

## 2021-11-29 MED ORDER — SODIUM CHLORIDE 0.9 % IV SOLN
Freq: Once | INTRAVENOUS | Status: AC
  Filled 2021-11-29: qty 250

## 2021-11-29 MED ORDER — HEPARIN SOD (PORK) LOCK FLUSH 100 UNIT/ML IV SOLN
500.0000 [IU] | Freq: Once | INTRAVENOUS | Status: AC | PRN
  Administered 2021-11-29: 500 [IU]
  Filled 2021-11-29: qty 5

## 2021-11-29 MED ORDER — SODIUM CHLORIDE 0.9 % IV SOLN
10.0000 mg/kg | Freq: Once | INTRAVENOUS | Status: AC
  Administered 2021-11-29: 860 mg via INTRAVENOUS
  Filled 2021-11-29: qty 10

## 2021-11-29 NOTE — Progress Notes (Signed)
?Hematology/Oncology Progress note ?[phone] [phone] ?  ? ? ? ?Patient Care Team: ?[name], MD as PCP - General (Physician Assistant) ?[name], MD as Referring Physician (Physician Assistant) ?Robert Bellow, MD (General Surgery) ?Telford Nab, RN as Sales executive ? ?REFERRING PROVIDER: ?[name], MD  ?CHIEF COMPLAINTS/REASON FOR VISIT:  ?Follow up for lung cancer ? ?HISTORY OF PRESENTING ILLNESS:  ? ?Joshua [name] is a  66 y.o.  male with PMH listed below was seen in consultation at the request of  [name], MD  for evaluation of lung mass ? ?Patient presented to emergency room on 08/25/2020 for evaluation of hemoptysis, coughing.  He coughed up small amount of blood and a large clot.  No fever, chills, chest pain, abdominal pain.  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  Patient is on Plavix and Eliquis.  Since his ER visit, he has not coughed up more blood.  ?08/25/2020 CT angio chest PE with and without contrast showed ill-defined soft tissue adjacent to the right upper lobe bronchus resulting in narrowing of right upper lobe bronchus.  Also right paratracheal mediastinal adenopathy.  Scattered centrilobular groundglass opacities within the right lower lobe and to a lesser extent within the right lower lobe likely reflecting an infectious or inflammatory process. ?# 09/14/2020, right upper lobe lung biopsy and bronchoscopy brushing are both positive for squamous cell carcinoma.Right precarinal space lymph node insufficient lymph node sampling for staging. ?09/18/2020, PET scan showed right upper lobe nodule maximal SUV 11.5.  Hypermetabolic cluster right paratracheal adenopathy with maximum SUV up to 12.  No findings of metastatic disease to the neck/abdomen/pelvis or skeleton. ? ?#  10/03/2000, MRI brain with and without contrast showed no evidence of intracranial metastatic disease. ?Well-defined round lesion within the left parotid tail measuring ?approximately 2.3 x 1.8 x 1.8 cm ?# Mediport placed by vascular surgeon.  ?# - NGS showed PD-L1 TPS 95%, LRP1B S974fs, PIK3CA E545K, RB1 c1422-2A>T, RIT1 T83_ A84del, STK11 P252fs, TPS53 R248W, TMB 19.13mut/mb, MS stable.  ? 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  Management per ENT  ? ?#10/10/2020 started concurrent chemoradiation.  Patient received 4 cycles of carboplatin AUC of 2 and Taxol 45 mg per metered square  Additional chemotherapy was held due to thrombocytopenia, neutropenia ?Patient finished radiation on 11/27/2020 ? ?#12/31/2020 -03/21/2021, Durvalumab maintenance ? ?03/28/2021, CT without contrast showed new linear right perihilar consolidation with associated traction bronchiectasis.  Likely postradiation changes.  Additional new peribronchial vascular/nodular groundglass opacities with areas of consolidation or seen primarily in the right upper and lower lobes, more distant from expected radiation fields.  Findings are possibly due to radiation-induced organizing pneumonia.  Or superimposed drug toxicity.  Recommend follow-up CT chest in 1 to 2 months for assessment of evaluation. ? 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  Patient was seen by pulmonology Dr. Patsey Berthold. ?Per my discussion with pulmonology Dr. 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  Recent CT showed improvement. ? ?05/09/2021, repeat CT chest without contrast showed evolving radiation changes in the right perihilar region likely accounting for interval increase to thickening of the posterior wall of the right upper lobe bronchus.  The more peripheral right lung groundglass opacities seen on most recent study have generally improved with exception of one peripherally right upper lobe which appears new.  These  are likely inflammatory.  No findings highly suspicious for local recurrence. ? ?05/30/2021 resumed on Durvalumab.  ?Dr. Patsey Berthold switched inhaler to  [facility] as he is not able to tolerate LABA/LAMA combination. ? ?#Left-sided parotid nodule, patient follows up with ENT.  Nodule was biopsied and pathology was not diagnostic, acellular debris's with amylase crystalloids.#Left-sided parotid nodule, patient follows up with ENT.  Nodule was biopsied and pathology was not diagnostic, acellular debris's with amylase crystalloids. ? ?Patient has severe peripheral artery disease followed by Dr. Delana Meyer.  He was on chronic anticoagulation with Eliquis and Plavix.  Anticoagulation was held temporarily for colonoscopy on 09/09/2021.  After colonoscopy, he developed right lower extremity rest pain, concerning of limb threatening ischemia.   ?09/25/2021, right lower extremity angiogram showed thrombosis of previous bypass grafts, atherosclerotic occlusive disease, with continuous thrombolytic therapy overnight.  09/26/2021, repeat angiogram and revascularization. ?Patient also reports that he started to have headache after colonoscopy, progressive difficulty using her right arm.  Progressively worsening of speech. ?10/07/2021, MRI brain with and without contrast showed 2 contrast-enhancing lesions, most consistent with metastatic disease.  Largest lesion is in the left frontal lobe and measures up to 1.3 cm with a large amount of surrounding edema and 9 mm rightward midline shift.  The other enhancing lesion was 4 mm in the right occipital lobe with a small amount of surrounding edema.  No acute or chronic hemorrhage. ? ?Patient was started on Dexamethasone.  He was seen by neurosurgery and radiation oncology.  Recommendation is to proceed with SRS.  Patient was discharged with dexamethasone. ? ?10/16/2021 PET scan showed postradiation changes in the right supra hilar lung without evidence of local recurrence.  No evidence of lung  cancer recurrence or metastatic disease.  Single focus of intense metabolic activity associated with posterior left 11th rib likely secondary to recent fall/posttraumatic metabolic changes.  Frontal  uptake in the left frontal lobe corresponding to the known metastatic lesion in the brain. ?PET scan results were reviewed and discussed with patient. No peripheral metastatic disease.  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  Patient understands that his condition is not curable.  Treatment is with palliative intent. ? ? ?INTERVAL HISTORY ?JEARLD HEMP is a 66 y.o. male who has above history reviewed by me today presents for follow up visit for Stage III lung squamous cell carcinoma. ?Finished SRS.  ?Occasionally he has dizziness when he changes position from lying down to sitting up.  Symptom resolves spontaneously.  Occasionally headache.  He reports having good muscle strength in all his extremities.  Appetite is fair. ?Fatigue, chronic. ? ? ?Review of Systems  ?Constitutional:  Positive for fatigue. Negative for appetite change, chills, fever and unexpected weight change.  ?HENT:   Negative for hearing loss and voice change.   ?Eyes:  Negative for eye problems and icterus.  ?Respiratory:  Negative for chest tightness, cough and shortness of breath.   ?Cardiovascular:  Negative for chest pain and leg swelling.  ?Gastrointestinal:  Negative for abdominal distention and abdominal pain.  ?Endocrine: Negative for hot flashes.  ?Genitourinary:  Negative for difficulty urinating, dysuria and frequency.   ?Musculoskeletal:  Negative for arthralgias.  ?Skin:  Negative for itching and rash.  ?Neurological:  Negative for light-headedness and numbness.  ?     Right upper extremity strength has improved.  ?Hematological:  Negative for adenopathy. Does not bruise/bleed easily.  ?Psychiatric/Behavioral:  Negative for confusion.   ?  ? ?MEDICAL HISTORY:  ?Past Medical History:  ?Diagnosis Date  ?  Allergy    ? Cancer [facility])   ? lung  ? Dyspnea   ? former smoker. only doe  ? History of kidney stones   ? Hypertension   ? Peripheral vascular disease ([place])   ? Pre-diabetes   ? Sleep apnea   ? ? ?SURGICAL HISTORY: ?Past

## 2021-11-29 NOTE — Patient Instructions (Signed)
[facility] CANCER CTR AT [place]  Discharge Instructions: ?Thank you for choosing [facility] to provide your oncology and hematology care.  ?If you have a lab appointment with the [facility], please go directly to the [facility] and check in at the registration area. ? ?Wear comfortable clothing and clothing appropriate for easy access to any Portacath or PICC line.  ? ?We strive to give you quality time with your provider. You may need to reschedule your appointment if you arrive late (15 or more minutes).  Arriving late affects you and other patients whose appointments are after yours.  Also, if you miss three or more appointments without notifying the office, you may be dismissed from the clinic at the provider?s discretion.    ?  ?For prescription refill requests, have your pharmacy contact our office and allow 72 hours for refills to be completed.   ? ?Today you received the following chemotherapy and/or immunotherapy agents: Durvalumab    ?  ?To help prevent nausea and vomiting after your treatment, we encourage you to take your nausea medication as directed. ? ?BELOW ARE SYMPTOMS THAT SHOULD BE REPORTED IMMEDIATELY: ?*FEVER GREATER THAN 100.4 F (38 ?C) OR HIGHER ?*CHILLS OR SWEATING ?*NAUSEA AND VOMITING THAT IS NOT CONTROLLED WITH YOUR NAUSEA MEDICATION ?*UNUSUAL SHORTNESS OF BREATH ?*UNUSUAL BRUISING OR BLEEDING ?*URINARY PROBLEMS (pain or burning when urinating, or frequent urination) ?*BOWEL PROBLEMS (unusual diarrhea, constipation, pain near the anus) ?TENDERNESS IN MOUTH AND THROAT WITH OR WITHOUT PRESENCE OF ULCERS (sore throat, sores in mouth, or a toothache) ?UNUSUAL RASH, SWELLING OR PAIN  ?UNUSUAL VAGINAL DISCHARGE OR ITCHING  ? ?Items with * indicate a potential emergency and should be followed up as soon as possible or go to the Emergency Department if any problems should occur. ? ?Please show the CHEMOTHERAPY ALERT CARD or IMMUNOTHERAPY ALERT CARD at check-in to  the Emergency Department and triage nurse. ? ?Should you have questions after your visit or need to cancel or reschedule your appointment, please contact [facility] CANCER [place] AT [place]  [phone] and follow the prompts.  Office hours are 8:00 a.m. to 4:30 p.m. Monday - Friday. Please note that voicemails left after 4:00 p.m. may not be returned until the following business day.  We are closed weekends and major holidays. You have access to a nurse at all times for urgent questions. Please call the main number to the clinic [phone] and follow the prompts. ? ?For any non-urgent questions, you may also contact your provider using MyChart. We now offer e-Visits for anyone 74 and older to request care online for non-urgent symptoms. For details visit [email or web address]. ?  ?Also download the MyChart app! Go to the app store, search "MyChart", open the app, select [facility], and log in with your MyChart username and password. ? ?Due to Covid, a mask is required upon entering the hospital/clinic. If you do not have a mask, one will be given to you upon arrival. For doctor visits, patients may have 1 support person aged 56 or older with them. For treatment visits, patients cannot have anyone with them due to current Covid guidelines and our immunocompromised population.  ?

## 2021-12-02 ENCOUNTER — Encounter: Payer: Self-pay | Admitting: Radiation Oncology

## 2021-12-02 ENCOUNTER — Ambulatory Visit
Admission: RE | Admit: 2021-12-02 | Discharge: 2021-12-02 | Disposition: A | Discharge status: Home / Self Care | Payer: Medicare Other | Source: Ambulatory Visit | Attending: Radiation Oncology | Admitting: Radiation Oncology

## 2021-12-02 VITALS — BP 142/87 | HR 90 | Temp 98.0°F | Resp 18 | Ht 71.0 in | Wt 203.9 lb

## 2021-12-02 DIAGNOSIS — R519 Headache, unspecified: Secondary | ICD-10-CM | POA: Diagnosis not present

## 2021-12-02 DIAGNOSIS — C3411 Malignant neoplasm of upper lobe, right bronchus or lung: Secondary | ICD-10-CM | POA: Insufficient documentation

## 2021-12-02 DIAGNOSIS — Z923 Personal history of irradiation: Secondary | ICD-10-CM | POA: Diagnosis not present

## 2021-12-02 DIAGNOSIS — C7931 Secondary malignant neoplasm of brain: Secondary | ICD-10-CM | POA: Diagnosis present

## 2021-12-02 DIAGNOSIS — Z87891 Personal history of nicotine dependence: Secondary | ICD-10-CM | POA: Insufficient documentation

## 2021-12-02 DIAGNOSIS — C3491 Malignant neoplasm of unspecified part of right bronchus or lung: Secondary | ICD-10-CM

## 2021-12-02 NOTE — Progress Notes (Signed)
Radiation Oncology ?Follow up Note ? ?Name: Joshua [name]   ?Date:   12/02/2021 ?MRN:  [id number] ?DOB: June 08, 1956  ? ? ?This 66 y.o. male presents to the clinic today for 1 month follow-up status post SRS for solitary brain metastasis and patient with known stage IIIa squamous cell carcinoma the right lung. ? ?REFERRING PROVIDER: [name], MD ? ?HPI: Patient is a 66 year old male now at 1 month having completed SRS for solitary brain metastasis in a patient with known stage IIIa squamous cell carcinoma the right lung.  Seen today in routine follow-up from a neurologic standpoint he is stable he is without complaint.  Does occasionally have a headache he is off steroids.  He is currently under immunotherapy durvalumab which she is tolerating well.  He has a follow-up MRI scan ordered in July. ?COMPLICATIONS OF TREATMENT: none ? ?FOLLOW UP COMPLIANCE: keeps appointments  ? ?PHYSICAL EXAM:  ?BP (!) 142/87   Pulse 90   Temp 98 ?F (36.7 ?C)   Resp 18   Ht 5\' 11"  (1.803 m)   Wt 203 lb 14.4 oz (92.5 kg)   BMI 28.44 kg/m?  ?No change in neurologic status crude visual fields are within normal range motor or sensory in detail levels are equal and symmetric in upper lower extremities.  Proprioception is intact.  Well-developed well-nourished patient in NAD. HEENT reveals PERLA, EOMI, discs not visualized.  Oral cavity is clear. No oral mucosal lesions are identified. Neck is clear without evidence of cervical or supraclavicular adenopathy. Lungs are clear to A&P. Cardiac examination is essentially unremarkable with regular rate and rhythm without murmur rub or thrill. Abdomen is benign with no organomegaly or masses noted. Motor sensory and DTR levels are equal and symmetric in the upper and lower extremities. Cranial nerves II through XII are grossly intact. Proprioception is intact. No peripheral adenopathy or edema is identified. No motor or sensory levels are noted. Crude visual fields are within  normal range. ? ?RADIOLOGY RESULTS: No current films for review ? ?PLAN: Present time patient is tolerated his SRS well with no significant side effects or complaints.  I will see him back in about 4 months for follow-up.  He continues immunotherapy with medical oncology.  Patient is to call with any concerns.  Depending on his MRI results may offer further therapy. ? ?I would like to take this opportunity to thank you for allowing me to participate in the care of your patient.. ?  ? Noreene Filbert, MD ? ?

## 2021-12-03 ENCOUNTER — Encounter: Payer: Self-pay | Admitting: Oncology

## 2021-12-04 ENCOUNTER — Ambulatory Visit (INDEPENDENT_AMBULATORY_CARE_PROVIDER_SITE_OTHER): Payer: Medicare Other | Admitting: Pulmonary Disease

## 2021-12-04 ENCOUNTER — Encounter: Payer: Self-pay | Admitting: Pulmonary Disease

## 2021-12-04 VITALS — BP 130/70 | HR 71 | Temp 97.6°F | Ht 71.0 in | Wt 205.8 lb

## 2021-12-04 DIAGNOSIS — C3491 Malignant neoplasm of unspecified part of right bronchus or lung: Secondary | ICD-10-CM | POA: Diagnosis not present

## 2021-12-04 DIAGNOSIS — C3490 Malignant neoplasm of unspecified part of unspecified bronchus or lung: Secondary | ICD-10-CM

## 2021-12-04 DIAGNOSIS — Z87891 Personal history of nicotine dependence: Secondary | ICD-10-CM

## 2021-12-04 DIAGNOSIS — C7931 Secondary malignant neoplasm of brain: Secondary | ICD-10-CM

## 2021-12-04 DIAGNOSIS — J449 Chronic obstructive pulmonary disease, unspecified: Secondary | ICD-10-CM

## 2021-12-04 MED ORDER — STIOLTO RESPIMAT 2.5-2.5 MCG/ACT IN AERS: 2.0000 | INHALATION_SPRAY | Freq: Every day | RESPIRATORY_TRACT | 0 refills | Status: DC

## 2021-12-04 NOTE — Progress Notes (Signed)
? ?Subjective:  ? ? Patient ID: Joshua [name], male    DOB: 10-Feb-1956, 66 y.o.   MRN: [id number] ?Patient Care Team: ?[name], MD as PCP - General (Physician Assistant) ?[name], MD as Referring Physician (Physician Assistant) ?Robert Bellow, MD (General Surgery) ?Telford Nab, RN as Sales executive ? ?Chief Complaint  ?Patient presents with  ? Follow-up  ?  SOB with exertion.   ? ? ?HPI ?Patient is a 66 year old former smoker (42 PY) with a history of COPD, shortness of breath on exertion and squamous cell carcinoma of the right upper lobe, stage IIIa disease.  The patient received concomitant chemoradiation followed by durvalumab maintenance as he is PD-L1 positive.  At his last visit on 04 September 2021 he was encouraged to continue [place] which was helping him somewhat with his shortness of breath.  He is intolerant of inhaled corticosteroids due to untoward side effects of hoarseness.  However since his last visit he has had quite a significant turn of events.  On the eighth of March he was noted to have ischemia of the right lower extremity doses of previous bypass grafts and interventions.  He required thrombectomy and angioplasty and tPA infusion.  He was discharged on 10 March after that intervention.  Readmitted to the hospital on 20 March due to weakness on his right side and neck pain.  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  He has received radiation therapy for this.  He presents today for follow-up on shortness of breath issues.  He is not taking Stiolto.  He seems arousable, forgetful.  He does not endorse any other symptomatology.  Appears frustrated. ? ?Has not had any fevers, chills or sweats since his last visit.  Did follow-up with oncology on the 12th and the 15th.  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  He is still receiving durvalumab and has completed radiation therapy to  the brain.  He is on Eliquis and Plavix for his severe peripheral artery disease status post thrombosis of the right lower extremity. ? ? ?DATA ?07/18/2021 PFTs: FEV1 2.80 L or 76% predicted, FVC 3.61 L or 74% predicted, there is a small airways component.  No significant bronchodilator response.  Lung volumes mildly reduced.  Diffusion capacity 71% by [place]. ? ?Review of Systems ?A 10 point review of systems was performed and it is as noted above otherwise negative. ? ?Patient Active Problem List  ? Diagnosis Date Noted  ? Brain metastasis 10/08/2021  ? Hypothyroidism, unspecified 10/08/2021  ? Elevated glucose 10/08/2021  ? Right hand weakness 10/07/2021  ? Parotid nodule 08/16/2021  ? Dysphagia 06/20/2021  ? COVID 06/20/2021  ? Organizing pneumonia ([place]) 04/04/2021  ? Hypothyroidism due to medication 04/04/2021  ? Encounter for antineoplastic immunotherapy 04/04/2021  ? Cancer ([place]) 01/14/2021  ? Antineoplastic chemotherapy induced anemia 10/17/2020  ? Squamous cell carcinoma of right lung ([place]) 09/20/2020  ? Goals of care, counseling/discussion 08/30/2020  ? Iron deficiency anemia 08/30/2020  ? Lung mass 08/30/2020  ? Ischemia of extremity 03/23/2020  ? Cervical myelopathy ([place]) 03/14/2020  ? Ischemia of right lower extremity 11/15/2019  ? Peripheral neuropathy 10/14/2019  ? Chronic left-sided low back pain without sciatica 04/13/2019  ? History of back surgery 04/13/2019  ? Overweight 04/13/2019  ? Atherosclerosis of lower extremity with claudication ([place]) 11/18/2016  ? Complication associated with vascular device 11/13/2016  ? Hyperlipidemia, unspecified 06/18/2016  ?  Radiculopathy of lumbar region 06/10/2016  ? PVD (peripheral vascular disease) ([place]) 05/08/2016  ? 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 Prediabetes 08/25/2014  ? ?Social History  ? ?Tobacco Use  ? Smoking status: Former  ?  Packs/day: 2.00  ?   Years: 38.00  ?  Pack years: 76.00  ?  Types: Cigarettes  ?  Quit date: 12/20/2014  ?  Years since quitting: 6.9  ? Smokeless tobacco: Never  ? Tobacco comments:  ?  quit smoking in 2017 most smoked 2   ?Substance Use Topics  ? Alcohol use: Not Currently  ? ?Allergies  ?Allergen Reactions  ? Tape   ?  [place] Dressing tape- burns his skin ?  ? ?Current Meds  ?Medication Sig  ? acetaminophen (TYLENOL) 325 MG tablet Take 650 mg by mouth every 6 (six) hours as needed for moderate pain.  ? clopidogrel (PLAVIX) 75 MG tablet TAKE 1 TABLET BY MOUTH  DAILY  ? ELIQUIS 5 MG TABS tablet TAKE 1 TABLET BY MOUTH  TWICE DAILY  ? famotidine (PEPCID) 20 MG tablet Take 1 tablet (20 mg total) by mouth daily. Take 1 tablet at bedtime the night before chemo and 1 tablet on the night of chemotherapy.  ? levETIRAcetam (KEPPRA) 500 MG tablet Take 1 tablet (500 mg total) by mouth 2 (two) times daily.  ? levothyroxine (SYNTHROID) 88 MCG tablet Take 1 tablet (88 mcg total) by mouth daily before breakfast.  ? simvastatin (ZOCOR) 40 MG tablet Take 40 mg by mouth at bedtime.  ?    ? ?Immunization History  ?Administered Date(s) Administered  ? Influenza,inj,Quad PF,6+ Mos 09/14/2016, 04/13/2019, 04/16/2020  ? Influenza-Unspecified 09/14/2016, 04/13/2019, 04/16/2020  ? PFIZER Comirnaty(Gray Top)Covid-19 Tri-Sucrose Vaccine 10/20/2019, 11/29/2019  ? PFIZER(Purple Top)SARS-COV-2 Vaccination 10/20/2019, 11/29/2019  ? Tdap 04/16/2020  ? ?   ?Objective:  ? Physical Exam ?BP 130/70 (BP Location: Left Arm, Cuff Size: Normal)   Pulse 71   Temp 97.6 ?F (36.4 ?C) (Temporal)   Ht $R'5\' 11"'Yd$  (1.803 m)   Wt 205 lb 12.8 oz (93.4 kg)   SpO2 96%   BMI 28.70 kg/m?  ?GENERAL: Chronically ill-appearing well-developed well-nourished gentleman, mildly tachypneic.  Ambulatory with assistance of cane.   ?HEAD: Normocephalic, atraumatic.  ?EYES: Pupils equal, round, reactive to light.  No scleral icterus.  ?MOUTH: Oral mucosa moist, no thrush. ?NECK: Supple.  Limited  ROM due to prior spinal fusion.  No thyromegaly. Trachea midline. No JVD.  No adenopathy. ?PULMONARY: Good air entry bilaterally.  Few rhonchi, few end expiratory wheezes. ?CARDIOVASCULAR: S1 and S2. Regular rate and rhythm.  No rubs, murmurs or gallops heard. ?ABDOMEN: Benign. ?MUSCULOSKELETAL: No joint deformity, no clubbing, no edema.  ?NEUROLOGIC: Mild psychomotor retardation noted, mild unsteady gait. ?SKIN: Intact,warm,dry. ?PSYCH: Taciturn, irascible. ? ?   ?Assessment & Plan:  ? ?  ICD-10-CM   ?1. COPD with chronic bronchitis and emphysema ([place])  J44.9   ? Resume Stiolto 2 inhalations ?Shortness of breath likely due to this ?Follow-up 3 months  ?  ?2. Primary malignant neoplasm of lung with metastasis to brain [facility])  C34.90   ? C79.31   ? Received radiation to the brain ?Continues follow-up with radiation and medical oncology ?  ?  ?3. Squamous cell carcinoma of right lung ([facility])  C34.91   ? Stage IV given metastatic disease to the brain ?Continues on durvalumab therapy  ?  ?  4. Former smoker  Z87.891   ? No evidence of relapse  ?  ? ?Meds ordered this encounter  ?Medications  ? Tiotropium Bromide-Olodaterol (STIOLTO RESPIMAT) 2.5-2.5 MCG/ACT AERS  ?  Sig: Inhale 2 puffs into the lungs daily.  ?  Dispense:  4 g  ?  Refill:  0  ?  Order Specific Question:   Lot Number?  ?  Answer:   [id number] E  ?  Order Specific Question:   Expiration Date?  ?  Answer:   12/20/2023  ?  Order Specific Question:   Quantity  ?  Answer:   2  ? ?Patient's issues with shortness of breath have been on longstanding.  He was noted to have some mild wheezing today and had not been using his Stiolto.  We provided him with some more Stiolto samples.  Unfortunately his squamous cell carcinoma of the lung has progressed despite all therapies.  COPD is currently the lesser of his issues.  Continue to follow along with oncology.  We will see him in follow-up in 3 months time call sooner should any new problems arise. ? ? ?C. Derrill Kay,  MD ?Advanced Bronchoscopy ?PCCM [facility] ? ? ? ?*This note was dictated using voice recognition software/Dragon.  Despite best efforts to proofread, errors can occur which can change the meaning. Any

## 2021-12-04 NOTE — Patient Instructions (Signed)
We are providing you with a few more samples of the Stiolto. ? ?Follow-up in 3 months time call sooner should any new problems arise. ?

## 2021-12-06 ENCOUNTER — Telehealth (INDEPENDENT_AMBULATORY_CARE_PROVIDER_SITE_OTHER): Payer: Self-pay

## 2021-12-06 ENCOUNTER — Telehealth: Payer: Self-pay | Admitting: *Deleted

## 2021-12-06 NOTE — Telephone Encounter (Signed)
Patient called the office stating that he having  right inner thigh aching,tightness,stiffness,swelling at ankle,and numbness inner knee.Patient was requesting to be seen sooner and we have someone check availability with schedule . I spoke with Arna Medici NP and she advise for the patient to come in for right le arterial see herself or Dr Delana Meyer.patient was advise if symptoms change he should go to the ED to evaluated

## 2021-12-06 NOTE — Telephone Encounter (Signed)
Patient called complaining of headaches that are becoming more frequent though are short in duration, He said Dr Tasia Catchings mentioned doing an MRI in June, but he feels he needs one sooner than that. Please advise

## 2021-12-09 ENCOUNTER — Other Ambulatory Visit: Payer: Self-pay

## 2021-12-09 ENCOUNTER — Other Ambulatory Visit (INDEPENDENT_AMBULATORY_CARE_PROVIDER_SITE_OTHER): Payer: Self-pay | Admitting: Nurse Practitioner

## 2021-12-09 DIAGNOSIS — I739 Peripheral vascular disease, unspecified: Secondary | ICD-10-CM

## 2021-12-09 DIAGNOSIS — C3491 Malignant neoplasm of unspecified part of right bronchus or lung: Secondary | ICD-10-CM

## 2021-12-09 DIAGNOSIS — M79604 Pain in right leg: Secondary | ICD-10-CM

## 2021-12-09 DIAGNOSIS — C7931 Secondary malignant neoplasm of brain: Secondary | ICD-10-CM

## 2021-12-09 NOTE — Telephone Encounter (Signed)
Dr. Tasia Catchings would like to obtain a STAT MRI brain.   Please schedule STAT brain MRI and inform pt of appt details.

## 2021-12-10 ENCOUNTER — Telehealth: Payer: Self-pay | Admitting: *Deleted

## 2021-12-10 ENCOUNTER — Ambulatory Visit
Admission: RE | Admit: 2021-12-10 | Discharge: 2021-12-10 | Disposition: A | Discharge status: Home / Self Care | Payer: Medicare Other | Source: Ambulatory Visit | Attending: Oncology | Admitting: Oncology

## 2021-12-10 ENCOUNTER — Encounter: Payer: Self-pay | Admitting: Oncology

## 2021-12-10 DIAGNOSIS — C7931 Secondary malignant neoplasm of brain: Secondary | ICD-10-CM | POA: Diagnosis present

## 2021-12-10 MED ORDER — GADOBUTROL 1 MMOL/ML IV SOLN
9.0000 mL | Freq: Once | INTRAVENOUS | Status: AC | PRN
  Administered 2021-12-10: 9 mL via INTRAVENOUS

## 2021-12-10 NOTE — Telephone Encounter (Signed)
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 Moderate surrounding edema has also decreased.   3-4 mm metastasis within the right occipital lobe, not significantly changed in size. There is now a small focus of central non enhancement within this lesion. Surrounding edema has slightly increased, possibly treatment-related. Attention recommended on imaging follow-up.   3 mm enhancing metastasis within the high left parietal lobe. In retrospect, this was likely present as a subtle punctate focus of enhancement on the prior brain MRI of 10/22/2021.   Unchanged punctate focus of enhancement within the left cerebellar hemisphere, which is favored vascular. Attention recommended on follow-up.   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     Electronically Signed   By: Kellie Simmering D.O.   On: 12/10/2021 14:51

## 2021-12-11 ENCOUNTER — Encounter: Payer: Self-pay | Admitting: Oncology

## 2021-12-11 ENCOUNTER — Other Ambulatory Visit: Payer: Self-pay | Admitting: Oncology

## 2021-12-11 ENCOUNTER — Telehealth: Payer: Self-pay

## 2021-12-11 ENCOUNTER — Ambulatory Visit (INDEPENDENT_AMBULATORY_CARE_PROVIDER_SITE_OTHER): Payer: Medicare Other | Admitting: Nurse Practitioner

## 2021-12-11 ENCOUNTER — Ambulatory Visit (INDEPENDENT_AMBULATORY_CARE_PROVIDER_SITE_OTHER): Payer: Medicare Other

## 2021-12-11 ENCOUNTER — Encounter (INDEPENDENT_AMBULATORY_CARE_PROVIDER_SITE_OTHER): Payer: Self-pay | Admitting: Nurse Practitioner

## 2021-12-11 VITALS — BP 124/68 | HR 77 | Resp 16 | Wt 203.0 lb

## 2021-12-11 DIAGNOSIS — Z9889 Other specified postprocedural states: Secondary | ICD-10-CM

## 2021-12-11 DIAGNOSIS — I739 Peripheral vascular disease, unspecified: Secondary | ICD-10-CM

## 2021-12-11 DIAGNOSIS — I10 Essential (primary) hypertension: Secondary | ICD-10-CM

## 2021-12-11 DIAGNOSIS — M79604 Pain in right leg: Secondary | ICD-10-CM | POA: Diagnosis not present

## 2021-12-11 DIAGNOSIS — E782 Mixed hyperlipidemia: Secondary | ICD-10-CM

## 2021-12-11 MED ORDER — DEXAMETHASONE 4 MG PO TABS: 4.0000 mg | ORAL_TABLET | Freq: Every day | ORAL | 0 refills | Status: DC

## 2021-12-11 MED ORDER — DEXAMETHASONE 4 MG PO TABS: 4.0000 mg | ORAL_TABLET | Freq: Two times a day (BID) | ORAL | 0 refills | Status: DC

## 2021-12-11 MED ORDER — BACLOFEN 10 MG PO TABS: 10.0000 mg | ORAL_TABLET | Freq: Three times a day (TID) | ORAL | 0 refills | Status: DC

## 2021-12-11 NOTE — Telephone Encounter (Signed)
Called to inform patient of MRI results and Dr. Collie Siad recommendation to start Dexamethone 4mg  daily. Patient did not answer but left detailed message and advised I would also send a MyChart message as well.

## 2021-12-11 NOTE — Telephone Encounter (Signed)
[facility] on [address] to clarfy prescription instructions. Pharmacy did not answer, left detailed message informing of Dexamethosone 4mg  tablet once daily. Advised to call back with any questions

## 2021-12-11 NOTE — Telephone Encounter (Signed)
-----   Message from [name], MD sent at 12/10/2021  7:02 PM EDT ----- I called him this evening unable to reach him.  I left him a voicemail advising patient to restart on dexamethasone 4 mg daily.  MRI results overall stable with an area of increased swelling.  Please give him a call in a.m.  Please ask if he has any dexamethasone supply.

## 2021-12-12 ENCOUNTER — Other Ambulatory Visit: Payer: Self-pay

## 2021-12-12 DIAGNOSIS — C3491 Malignant neoplasm of unspecified part of right bronchus or lung: Secondary | ICD-10-CM

## 2021-12-13 ENCOUNTER — Inpatient Hospital Stay (HOSPITAL_BASED_OUTPATIENT_CLINIC_OR_DEPARTMENT_OTHER): Payer: Medicare Other | Admitting: Oncology

## 2021-12-13 ENCOUNTER — Inpatient Hospital Stay: Payer: Medicare Other

## 2021-12-13 ENCOUNTER — Other Ambulatory Visit: Payer: Self-pay

## 2021-12-13 ENCOUNTER — Encounter: Payer: Self-pay | Admitting: Oncology

## 2021-12-13 VITALS — BP 136/70 | HR 65 | Temp 97.3°F | Resp 18 | Wt 207.9 lb

## 2021-12-13 DIAGNOSIS — C3491 Malignant neoplasm of unspecified part of right bronchus or lung: Secondary | ICD-10-CM

## 2021-12-13 DIAGNOSIS — Z5112 Encounter for antineoplastic immunotherapy: Secondary | ICD-10-CM

## 2021-12-13 DIAGNOSIS — I743 Embolism and thrombosis of arteries of the lower extremities: Secondary | ICD-10-CM

## 2021-12-13 DIAGNOSIS — E032 Hypothyroidism due to medicaments and other exogenous substances: Secondary | ICD-10-CM

## 2021-12-13 DIAGNOSIS — C7931 Secondary malignant neoplasm of brain: Secondary | ICD-10-CM | POA: Diagnosis not present

## 2021-12-13 LAB — COMPREHENSIVE METABOLIC PANEL
ALT: 18 U/L (ref 0–44)
AST: 23 U/L (ref 15–41)
Albumin: 3.6 g/dL (ref 3.5–5.0)
Alkaline Phosphatase: 71 U/L (ref 38–126)
Anion gap: 8 (ref 5–15)
BUN: 15 mg/dL (ref 8–23)
CO2: 27 mmol/L (ref 22–32)
Calcium: 8.7 mg/dL — ABNORMAL LOW (ref 8.9–10.3)
Chloride: 109 mmol/L (ref 98–111)
Creatinine, Ser: 0.91 mg/dL (ref 0.61–1.24)
GFR, Estimated: >60 mL/min (ref >60)
Glucose, Bld: 104 mg/dL — ABNORMAL HIGH (ref 70–99)
Potassium: 3.6 mmol/L (ref 3.5–5.1)
Sodium: 144 mmol/L (ref 135–145)
Total Bilirubin: 0.5 mg/dL (ref 0.3–1.2)
Total Protein: 6.6 g/dL (ref 6.5–8.1)

## 2021-12-13 LAB — CBC WITH DIFFERENTIAL/PLATELET
Abs Immature Granulocytes: 0.05 10*3/uL (ref 0.00–0.07)
Basophils Absolute: 0 10*3/uL (ref 0.0–0.1)
Basophils Relative: 0 %
Eosinophils Absolute: 0 10*3/uL (ref 0.0–0.5)
Eosinophils Relative: 0 %
HCT: 33.4 % — ABNORMAL LOW (ref 39.0–52.0)
Hemoglobin: 10.6 g/dL — ABNORMAL LOW (ref 13.0–17.0)
Immature Granulocytes: 1 %
Lymphocytes Relative: 13 %
Lymphs Abs: 1.1 10*3/uL (ref 0.7–4.0)
MCH: 28.6 pg (ref 26.0–34.0)
MCHC: 31.7 g/dL (ref 30.0–36.0)
MCV: 90.3 fL (ref 80.0–100.0)
Monocytes Absolute: 0.9 10*3/uL (ref 0.1–1.0)
Monocytes Relative: 10 %
Neutro Abs: 6.5 10*3/uL (ref 1.7–7.7)
Neutrophils Relative %: 76 %
Platelets: 172 10*3/uL (ref 150–400)
RBC: 3.7 MIL/uL — ABNORMAL LOW (ref 4.22–5.81)
RDW: 15 % (ref 11.5–15.5)
WBC: 8.5 10*3/uL (ref 4.0–10.5)
nRBC: 0 % (ref 0.0–0.2)

## 2021-12-13 LAB — TSH: TSH: 11.973 u[IU]/mL — ABNORMAL HIGH (ref 0.350–4.500)

## 2021-12-13 LAB — T4, FREE: Free T4: 0.93 ng/dL (ref 0.61–1.12)

## 2021-12-13 MED ORDER — HEPARIN SOD (PORK) LOCK FLUSH 100 UNIT/ML IV SOLN
500.0000 [IU] | Freq: Once | INTRAVENOUS | Status: AC | PRN
  Administered 2021-12-13: 500 [IU]
  Filled 2021-12-13: qty 5

## 2021-12-13 MED ORDER — SODIUM CHLORIDE 0.9 % IV SOLN
Freq: Once | INTRAVENOUS | Status: AC
  Filled 2021-12-13: qty 250

## 2021-12-13 MED ORDER — SODIUM CHLORIDE 0.9 % IV SOLN
10.0000 mg/kg | Freq: Once | INTRAVENOUS | Status: AC
  Administered 2021-12-13: 860 mg via INTRAVENOUS
  Filled 2021-12-13: qty 10

## 2021-12-13 NOTE — Progress Notes (Signed)
Pt here for follow up. No new concerns voiced.   

## 2021-12-13 NOTE — Patient Instructions (Signed)
[facility] [place]  Discharge Instructions: Thank you for choosing [facility] to provide your oncology and hematology care.  If you have a lab appointment with the [facility], please go directly to the [facility] and check in at the registration area.  Wear comfortable clothing and clothing appropriate for easy access to any Portacath or PICC line.   We strive to give you quality time with your provider. You may need to reschedule your appointment if you arrive late (15 or more minutes).  Arriving late affects you and other patients whose appointments are after yours.  Also, if you miss three or more appointments without notifying the office, you may be dismissed from the clinic at the provider's discretion.      For prescription refill requests, have your pharmacy contact our office and allow 72 hours for refills to be completed.    Today you received the following chemotherapy and/or immunotherapy agents: durvalumab  To help prevent nausea and vomiting after your treatment, we encourage you to take your nausea medication as directed.  BELOW ARE SYMPTOMS THAT SHOULD BE REPORTED IMMEDIATELY: *FEVER GREATER THAN 100.4 F (38 C) OR HIGHER *CHILLS OR SWEATING *NAUSEA AND VOMITING THAT IS NOT CONTROLLED WITH YOUR NAUSEA MEDICATION *UNUSUAL SHORTNESS OF BREATH *UNUSUAL BRUISING OR BLEEDING *URINARY PROBLEMS (pain or burning when urinating, or frequent urination) *BOWEL PROBLEMS (unusual diarrhea, constipation, pain near the anus) TENDERNESS IN MOUTH AND THROAT WITH OR WITHOUT PRESENCE OF ULCERS (sore throat, sores in mouth, or a toothache) UNUSUAL RASH, SWELLING OR PAIN  UNUSUAL VAGINAL DISCHARGE OR ITCHING   Items with * indicate a potential emergency and should be followed up as soon as possible or go to the Emergency Department if any problems should occur.  Please show the CHEMOTHERAPY ALERT CARD or IMMUNOTHERAPY ALERT CARD at check-in to the  Emergency Department and triage nurse.  Should you have questions after your visit or need to cancel or reschedule your appointment, please contact [facility] [place] [facility] [place]  [phone] and follow the prompts.  Office hours are 8:00 a.m. to 4:30 p.m. Monday - Friday. Please note that voicemails left after 4:00 p.m. may not be returned until the following business day.  We are closed weekends and major holidays. You have access to a nurse at all times for urgent questions. Please call the main number to the clinic [phone] and follow the prompts.  For any non-urgent questions, you may also contact your provider using MyChart. We now offer e-Visits for anyone 62 and older to request care online for non-urgent symptoms. For details visit [email or web address].   Also download the MyChart app! Go to the app store, search "MyChart", open the app, select [facility], and log in with your MyChart username and password.  Due to Covid, a mask is required upon entering the hospital/clinic. If you do not have a mask, one will be given to you upon arrival. For doctor visits, patients may have 1 support person aged 45 or older with them. For treatment visits, patients cannot have anyone with them due to current Covid guidelines and our immunocompromised population.

## 2021-12-13 NOTE — Progress Notes (Signed)
Hematology/Oncology Progress note [phone] [phone]      Patient Care Team: [name], MD as PCP - General (Physician Assistant) [name], MD as Referring Physician (Physician Assistant) Bary Castilla, [name], MD (General Surgery) Telford Nab, RN as Oncology Nurse Navigator  REFERRING PROVIDER: [name], MD  CHIEF COMPLAINTS/REASON FOR VISIT:  Follow up for lung cancer  HISTORY OF PRESENTING ILLNESS:   Joshua [name] is a  66 y.o.  male with PMH listed below was seen in consultation at the request of  [name], MD  for evaluation of lung mass  Patient presented to emergency room on 08/25/2020 for evaluation of hemoptysis, coughing.  He coughed up small amount of blood and a large clot.  No fever, chills, chest pain, abdominal pain.  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  Patient is on Plavix and Eliquis.  Since his ER visit, he has not coughed up more blood.  08/25/2020 CT angio chest PE with and without contrast showed ill-defined soft tissue adjacent to the right upper lobe bronchus resulting in narrowing of right upper lobe bronchus.  Also right paratracheal mediastinal adenopathy.  Scattered centrilobular groundglass opacities within the right lower lobe and to a lesser extent within the right lower lobe likely reflecting an infectious or inflammatory process. # 09/14/2020, right upper lobe lung biopsy and bronchoscopy brushing are both positive for squamous cell carcinoma.Right precarinal space lymph node insufficient lymph node sampling for staging. 09/18/2020, PET scan showed right upper lobe nodule maximal SUV 11.5.  Hypermetabolic cluster right paratracheal adenopathy with maximum SUV up to 12.  No findings of metastatic disease to the neck/abdomen/pelvis or skeleton.  #  10/03/2000, MRI brain with and without contrast showed no evidence of intracranial metastatic disease. 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  # - NGS showed PD-L1 TPS 95%, LRP1B S922fs, PIK3CA E545K, RB1 c1422-2A>T, RIT1 T83_ A84del, STK11 P245fs, TPS53 R248W, TMB 19.56mut/mb, MS stable.   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  Management per ENT   #10/10/2020 started concurrent chemoradiation.  Patient received 4 cycles of carboplatin AUC of 2 and Taxol 45 mg per metered square  Additional chemotherapy was held due to thrombocytopenia, neutropenia Patient finished radiation on 11/27/2020  #12/31/2020 -03/21/2021, Durvalumab maintenance  03/28/2021, CT without contrast showed new linear right perihilar consolidation with associated traction bronchiectasis.  Likely postradiation changes.  Additional new peribronchial vascular/nodular groundglass opacities with areas of consolidation or seen primarily in the right upper and lower lobes, more distant from expected radiation fields.  Findings are possibly due to radiation-induced organizing pneumonia.  Or superimposed drug toxicity.  Recommend follow-up CT chest in 1 to 2 months for assessment of evaluation.  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  Patient was seen by pulmonology Dr. Patsey Berthold. Per my discussion with pulmonology Dr. 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  Recent CT showed improvement.  05/09/2021, repeat CT chest without contrast showed evolving radiation changes in the right perihilar region likely accounting for interval increase to thickening of the posterior wall of the right upper lobe bronchus.  The more peripheral right lung groundglass opacities seen on most recent study have generally improved with exception of one peripherally right upper lobe which appears new.  These  are likely inflammatory.  No findings highly suspicious for local recurrence.  05/30/2021 resumed on Durvalumab.  Dr. Patsey Berthold switched inhaler to  [facility] as he is not able to tolerate LABA/LAMA combination.  #Left-sided parotid nodule, patient follows up with ENT.  Nodule was biopsied and pathology was not diagnostic, acellular debris's with amylase crystalloids.#Left-sided parotid nodule, patient follows up with ENT.  Nodule was biopsied and pathology was not diagnostic, acellular debris's with amylase crystalloids.  Patient has severe peripheral artery disease followed by Dr. Delana Meyer.  He was on chronic anticoagulation with Eliquis and Plavix.  Anticoagulation was held temporarily for colonoscopy on 09/09/2021.  After colonoscopy, he developed right lower extremity rest pain, concerning of limb threatening ischemia.   09/25/2021, right lower extremity angiogram showed thrombosis of previous bypass grafts, atherosclerotic occlusive disease, with continuous thrombolytic therapy overnight.  09/26/2021, repeat angiogram and revascularization. Patient also reports that he started to have headache after colonoscopy, progressive difficulty using her right arm.  Progressively worsening of speech. 10/07/2021, MRI brain with and without contrast showed 2 contrast-enhancing lesions, most consistent with metastatic disease.  Largest lesion is in the left frontal lobe and measures up to 1.3 cm with a large amount of surrounding edema and 9 mm rightward midline shift.  The other enhancing lesion was 4 mm in the right occipital lobe with a small amount of surrounding edema.  No acute or chronic hemorrhage.  Patient was started on Dexamethasone.  He was seen by neurosurgery and radiation oncology.  Recommendation is to proceed with SRS.  Patient was discharged with dexamethasone.  10/16/2021 PET scan showed postradiation changes in the right supra hilar lung without evidence of local recurrence.  No evidence of lung  cancer recurrence or metastatic disease.  Single focus of intense metabolic activity associated with posterior left 11th rib likely secondary to recent fall/posttraumatic metabolic changes.  Frontal  uptake in the left frontal lobe corresponding to the known metastatic lesion in the brain. PET scan results were reviewed and discussed with patient. No peripheral metastatic disease.  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  Patient understands that his condition is not curable.  Treatment is with palliative intent.   INTERVAL HISTORY Joshua [name] is a 66 y.o. male who has above history reviewed by me today presents for follow up visit for Stage III lung squamous cell carcinoma. During the interval, patient called and report worsening of headache. 12/10/2021, MRI brain with and without contrast was obtained. Since the prior brain MRI of 10/22/2021, there has been a continued interval decrease in size of a left frontal lobe metastasis, now measuring 10 mm. Moderate surrounding edema has also decreased.  3-4 mm metastasis within the right occipital lobe, not significantly changed in size. There is now a small focus of central non enhancement within this lesion. Surrounding edema has slightly increased, possibly treatment-related. Attention recommended on imaging follow-up. 3 mm enhancing metastasis within the high left parietal lobe. In retrospect, this was likely present as a subtle punctate focus of enhancement on the prior brain MRI of 10/22/2021.   Unchanged punctate focus of enhancement within the left cerebellar hemisphere, which is favored vascular. Attention recommended on follow-up. 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  Restarted patient on dexamethasone 4 mg daily.  Today patient reports that headache is intermittent, comes and goes.  No focal weakness, change in vision, dizziness.   Review of Systems  Constitutional:   Positive for fatigue. Negative for appetite change, chills, fever and  unexpected weight change.  HENT:   Negative for hearing loss and voice change.   Eyes:  Negative for eye problems and icterus.  Respiratory:  Negative for chest tightness, cough and shortness of breath.   Cardiovascular:  Negative for chest pain and leg swelling.  Gastrointestinal:  Negative for abdominal distention and abdominal pain.  Endocrine: Negative for hot flashes.  Genitourinary:  Negative for difficulty urinating, dysuria and frequency.   Musculoskeletal:  Negative for arthralgias.  Skin:  Negative for itching and rash.  Neurological:  Negative for light-headedness and numbness.       Right upper extremity strength has improved.  Hematological:  Negative for adenopathy. Does not bruise/bleed easily.  Psychiatric/Behavioral:  Negative for confusion.      MEDICAL HISTORY:  Past Medical History:  Diagnosis Date   Allergy    Cancer ([place])    lung   Dyspnea    former smoker. only doe   History of kidney stones    Hypertension    Peripheral vascular disease ([place])    Pre-diabetes    Sleep apnea     SURGICAL HISTORY: Past Surgical History:  Procedure Laterality Date   ANTERIOR CERVICAL DECOMP/DISCECTOMY FUSION N/A 03/14/2020   Procedure: ANTERIOR CERVICAL DECOMPRESSION/DISCECTOMY FUSION 3 LEVELS C4-7;  Surgeon: Meade Maw, MD;  Location: [facility] ORS;  Service: Neurosurgery;  Laterality: N/A;   BACK SURGERY  2011   CENTRAL LINE INSERTION Right 09/10/2016   Procedure: CENTRAL LINE INSERTION;  Surgeon: Katha Cabal, MD;  Location: [facility] ORS;  Service: Vascular;  Laterality: Right;   CERVICAL FUSION     C4-c7 on Mar 14, 2020   COLONOSCOPY  2013 ?   COLONOSCOPY N/A 09/09/2021   Procedure: COLONOSCOPY;  Surgeon: [name], DO;  Location: [facility] ENDOSCOPY;  Service: Gastroenterology;  Laterality: N/A;   CORONARY ANGIOPLASTY     ESOPHAGOGASTRODUODENOSCOPY N/A 09/09/2021   Procedure:  ESOPHAGOGASTRODUODENOSCOPY (EGD);  Surgeon: [name], DO;  Location: [facility] ENDOSCOPY;  Service: Gastroenterology;  Laterality: N/A;   FEMORAL-POPLITEAL BYPASS GRAFT Right 09/10/2016   Procedure: BYPASS GRAFT FEMORAL-POPLITEAL ARTERY ( BELOW KNEE );  Surgeon: Katha Cabal, MD;  Location: [facility] ORS;  Service: Vascular;  Laterality: Right;   FEMORAL-TIBIAL BYPASS GRAFT Right 03/23/2020   Procedure: BYPASS GRAFT RIGHT FEMORAL- DISTAL TIBIAL ARTERY WITH DISTAL FLOW GRAFT;  Surgeon: Katha Cabal, MD;  Location: [facility] ORS;  Service: Vascular;  Laterality: Right;   LEFT HEART CATH AND CORONARY ANGIOGRAPHY Left 05/16/2021   Procedure: LEFT HEART CATH AND CORONARY ANGIOGRAPHY;  Surgeon: Andrez Grime, MD;  Location: [place] [facility];  Service: Cardiovascular;  Laterality: Left;   LOWER EXTREMITY ANGIOGRAPHY Right 11/18/2016   Procedure: Lower Extremity Angiography;  Surgeon: Katha Cabal, MD;  Location: [place] [facility];  Service: Cardiovascular;  Laterality: Right;   LOWER EXTREMITY ANGIOGRAPHY Right 12/09/2016   Procedure: Lower Extremity Angiography;  Surgeon: Katha Cabal, MD;  Location: [place] [facility];  Service: Cardiovascular;  Laterality: Right;   LOWER EXTREMITY ANGIOGRAPHY Right 11/15/2019   Procedure: LOWER EXTREMITY ANGIOGRAPHY;  Surgeon: Katha Cabal, MD;  Location: [place] [facility];  Service: Cardiovascular;  Laterality: Right;   LOWER EXTREMITY ANGIOGRAPHY Right 12/01/2019   Procedure: Lower Extremity Angiography;  Surgeon: Algernon Huxley, MD;  Location: [place] [facility];  Service: Cardiovascular;  Laterality: Right;   LOWER EXTREMITY ANGIOGRAPHY Right 12/02/2019   Procedure: LOWER EXTREMITY ANGIOGRAPHY;  Surgeon: Katha Cabal, MD;  Location: [place] IN[facility] CV  LAB;  Service: Cardiovascular;  Laterality: Right;   LOWER EXTREMITY ANGIOGRAPHY Right 03/23/2020   Procedure: Lower Extremity Angiography;  Surgeon: Katha Cabal, MD;   Location: [place] [facility];  Service: Cardiovascular;  Laterality: Right;   LOWER EXTREMITY ANGIOGRAPHY Right 09/25/2021   Procedure: Lower Extremity Angiography;  Surgeon: Katha Cabal, MD;  Location: [place] [facility];  Service: Cardiovascular;  Laterality: Right;   LOWER EXTREMITY ANGIOGRAPHY Right 09/26/2021   Procedure: Lower Extremity Angiography;  Surgeon: Algernon Huxley, MD;  Location: [place] [facility];  Service: Cardiovascular;  Laterality: Right;   LOWER EXTREMITY INTERVENTION  11/18/2016   Procedure: Lower Extremity Intervention;  Surgeon: Katha Cabal, MD;  Location: [place] [facility];  Service: Cardiovascular;;   PERIPHERAL VASCULAR CATHETERIZATION Right 01/02/2015   Procedure: Lower Extremity Angiography;  Surgeon: Katha Cabal, MD;  Location: [place] [facility];  Service: Cardiovascular;  Laterality: Right;   PERIPHERAL VASCULAR CATHETERIZATION Right 06/18/2015   Procedure: Lower Extremity Angiography;  Surgeon: Algernon Huxley, MD;  Location: [place] [facility];  Service: Cardiovascular;  Laterality: Right;   PERIPHERAL VASCULAR CATHETERIZATION  06/18/2015   Procedure: Lower Extremity Intervention;  Surgeon: Algernon Huxley, MD;  Location: [place] [facility];  Service: Cardiovascular;;   PERIPHERAL VASCULAR CATHETERIZATION N/A 10/09/2015   Procedure: Abdominal Aortogram w/Lower Extremity;  Surgeon: Katha Cabal, MD;  Location: [place] [facility];  Service: Cardiovascular;  Laterality: N/A;   PERIPHERAL VASCULAR CATHETERIZATION  10/09/2015   Procedure: Lower Extremity Intervention;  Surgeon: Katha Cabal, MD;  Location: [place] [facility];  Service: Cardiovascular;;   PERIPHERAL VASCULAR CATHETERIZATION Right 10/10/2015   Procedure: Lower Extremity Angiography;  Surgeon: Algernon Huxley, MD;  Location: [place] [facility];  Service: Cardiovascular;  Laterality: Right;   PERIPHERAL VASCULAR CATHETERIZATION Right 10/10/2015   Procedure: Lower Extremity  Intervention;  Surgeon: Algernon Huxley, MD;  Location: [place] [facility];  Service: Cardiovascular;  Laterality: Right;   PERIPHERAL VASCULAR CATHETERIZATION Right 12/03/2015   Procedure: Lower Extremity Angiography;  Surgeon: Algernon Huxley, MD;  Location: [place] [facility];  Service: Cardiovascular;  Laterality: Right;   PERIPHERAL VASCULAR CATHETERIZATION Right 12/04/2015   Procedure: Lower Extremity Angiography;  Surgeon: Algernon Huxley, MD;  Location: [place] [facility];  Service: Cardiovascular;  Laterality: Right;   PERIPHERAL VASCULAR CATHETERIZATION  12/04/2015   Procedure: Lower Extremity Intervention;  Surgeon: Algernon Huxley, MD;  Location: [place] [facility];  Service: Cardiovascular;;   PERIPHERAL VASCULAR CATHETERIZATION Right 06/30/2016   Procedure: Lower Extremity Angiography;  Surgeon: Algernon Huxley, MD;  Location: [place] [facility];  Service: Cardiovascular;  Laterality: Right;   PERIPHERAL VASCULAR CATHETERIZATION Right 06/25/2016   Procedure: Lower Extremity Angiography;  Surgeon: Katha Cabal, MD;  Location: [place] [facility];  Service: Cardiovascular;  Laterality: Right;   PERIPHERAL VASCULAR CATHETERIZATION Right 07/01/2016   Procedure: Lower Extremity Angiography;  Surgeon: Katha Cabal, MD;  Location: [place] [facility];  Service: Cardiovascular;  Laterality: Right;   PERIPHERAL VASCULAR CATHETERIZATION  07/01/2016   Procedure: Lower Extremity Intervention;  Surgeon: Katha Cabal, MD;  Location: [place] [facility];  Service: Cardiovascular;;   PERIPHERAL VASCULAR CATHETERIZATION Right 08/01/2016   Procedure: Lower Extremity Angiography;  Surgeon: Katha Cabal, MD;  Location: [place] [facility];  Service: Cardiovascular;  Laterality: Right;   PORTA CATH INSERTION N/A 10/05/2020   Procedure: PORTA CATH INSERTION;  Surgeon: Delana Meyer,  Latina Craver, MD;  Location: [facility];  Service: Cardiovascular;  Laterality: N/A;   stent placement in right leg  Right    VIDEO BRONCHOSCOPY N/A 09/14/2020   Procedure: VIDEO BRONCHOSCOPY WITH FLUORO;  Surgeon: Salena Saner, MD;  Location: [facility]S;  Service: Cardiopulmonary;  Laterality: N/A;   VIDEO BRONCHOSCOPY WITH ENDOBRONCHIAL ULTRASOUND N/A 09/14/2020   Procedure: VIDEO BRONCHOSCOPY WITH ENDOBRONCHIAL ULTRASOUND;  Surgeon: Salena Saner, MD;  Location: [facility] ORS;  Service: Cardiopulmonary;  Laterality: N/A;    SOCIAL HISTORY: Social History   Socioeconomic History   Marital status: Single    Spouse name: Not on file   Number of children: Not on file   Years of education: Not on file   Highest education level: Not on file  Occupational History   Occupation: unemployed  Tobacco Use   Smoking status: Former    Packs/day: 2.00    Years: 38.00    Pack years: 76.00    Types: Cigarettes    Quit date: 12/20/2014    Years since quitting: 6.9   Smokeless tobacco: Never   Tobacco comments:    quit smoking in 2017 most smoked 2   Vaping Use   Vaping Use: Former  Substance and Sexual Activity   Alcohol use: Not Currently   Drug use: No   Sexual activity: Yes  Other Topics Concern   Not on file  Social History Narrative   Not on file   Social Determinants of Health   Financial Resource Strain: Not on file  Food Insecurity: Not on file  Transportation Needs: Not on file  Physical Activity: Not on file  Stress: Not on file  Social Connections: Not on file  Intimate Partner Violence: Not on file    FAMILY HISTORY: Family History  Problem Relation Age of Onset   Diabetes Mother    Hypertension Mother    Heart murmur Mother    Leukemia Mother    Throat cancer Maternal Grandmother    Colon cancer Neg Hx    Breast cancer Neg Hx     ALLERGIES:  has no active allergies.  MEDICATIONS:  Current Outpatient Medications  Medication Sig Dispense Refill   acetaminophen (TYLENOL) 325 MG tablet Take 650 mg by mouth every 6 (six) hours as needed for moderate pain.     baclofen  (LIORESAL) 10 MG tablet Take 1 tablet (10 mg total) by mouth 3 (three) times daily. 30 each 0   clopidogrel (PLAVIX) 75 MG tablet TAKE 1 TABLET BY MOUTH  DAILY 100 tablet 2   dexamethasone (DECADRON) 4 MG tablet Take 1 tablet (4 mg total) by mouth daily. 14 tablet 0   ELIQUIS 5 MG TABS tablet TAKE 1 TABLET BY MOUTH  TWICE DAILY 120 tablet 5   famotidine (PEPCID) 20 MG tablet Take 1 tablet (20 mg total) by mouth daily. Take 1 tablet at bedtime the night before chemo and 1 tablet on the night of chemotherapy. 90 tablet 0   levETIRAcetam (KEPPRA) 500 MG tablet Take 1 tablet (500 mg total) by mouth 2 (two) times daily. 60 tablet 3   levothyroxine (SYNTHROID) 88 MCG tablet Take 1 tablet (88 mcg total) by mouth daily before breakfast. 30 tablet 2   simvastatin (ZOCOR) 40 MG tablet Take 40 mg by mouth at bedtime.     Tiotropium Bromide-Olodaterol (STIOLTO RESPIMAT) 2.5-2.5 MCG/ACT AERS Inhale 2 puffs into the lungs daily. 4 g 0   No current facility-administered medications for this visit.  PHYSICAL EXAMINATION: ECOG PERFORMANCE STATUS: 1 - Symptomatic but completely ambulatory Vitals:   12/13/21 0837  BP: 136/70  Pulse: 65  Resp: 18  Temp: (!) 97.3 F (36.3 C)   Filed Weights   12/13/21 0837  Weight: 207 lb 14.4 oz (94.3 kg)    Physical Exam Constitutional:      General: He is not in acute distress.    Appearance: He is not diaphoretic.     Comments: Patient walks with a cane  HENT:     Head: Normocephalic and atraumatic.     Nose: Nose normal.     Mouth/Throat:     Pharynx: No oropharyngeal exudate.  Eyes:     General: No scleral icterus.    Pupils: Pupils are equal, round, and reactive to light.  Cardiovascular:     Rate and Rhythm: Normal rate and regular rhythm.     Heart sounds: No murmur heard. Pulmonary:     Effort: Pulmonary effort is normal. No respiratory distress.     Breath sounds: No rales.  Chest:     Chest wall: No tenderness.  Abdominal:     General:  There is no distension.     Palpations: Abdomen is soft.     Tenderness: There is no abdominal tenderness.  Musculoskeletal:        General: Normal range of motion.     Cervical back: Normal range of motion and neck supple.  Skin:    General: Skin is warm and dry.     Findings: No erythema.  Neurological:     Mental Status: He is alert and oriented to person, place, and time.     Cranial Nerves: No cranial nerve deficit.     Motor: No abnormal muscle tone.     Coordination: Coordination normal.  Psychiatric:        Mood and Affect: Affect normal.     LABORATORY DATA:  I have reviewed the data as listed Lab Results  Component Value Date   WBC 8.5 12/13/2021   HGB 10.6 (L) 12/13/2021   HCT 33.4 (L) 12/13/2021   MCV 90.3 12/13/2021   PLT 172 12/13/2021   Recent Labs    11/15/21 0857 11/29/21 0809 12/13/21 0818  NA 137 144 144  K 3.8 3.9 3.6  CL 105 109 109  CO2 $Re'27 24 27  'kjm$ GLUCOSE 110* 130* 104*  BUN 25* 25* 15  CREATININE 0.92 1.43* 0.91  CALCIUM 8.5* 8.6* 8.7*  GFRNONAA >60 55* >60  PROT 6.6 7.1 6.6  ALBUMIN 3.4* 3.7 3.6  AST $Re'18 26 23  'VLp$ ALT $R'27 25 18  'zB$ ALKPHOS 54 72 71  BILITOT 0.6 0.6 0.5    Iron/TIBC/Ferritin/ %Sat    Component Value Date/Time   IRON 48 08/30/2020 1052   TIBC 391 08/30/2020 1052   FERRITIN 25 08/30/2020 1052   IRONPCTSAT 12 (L) 08/30/2020 1052      RADIOGRAPHIC STUDIES: I have personally reviewed the radiological images as listed and agreed with the findings in the report. MR Brain W Wo Contrast  Result Date: 12/10/2021 CLINICAL DATA:  Provided history: Metastasis to brain. History of brain Mets, headaches. EXAM: MRI HEAD WITHOUT AND WITH CONTRAST TECHNIQUE: Multiplanar, multiecho pulse sequences of the brain and surrounding structures were obtained without and with intravenous contrast. CONTRAST:  67mL GADAVIST GADOBUTROL 1 MMOL/ML IV SOLN COMPARISON:  Prior brain MRI examinations 10/22/2021 and earlier. FINDINGS: Brain: No age advanced or  lobar predominant parenchymal atrophy. Continued interval decrease in size of  an enhancing metastasis the subcortical white matter of the posterolateral left frontal lobe, now measuring 10 mm (previously measuring 12 mm) (series 18, image 107). Moderate surrounding vasogenic edema has also decreased. A 3-4 mm enhancing lesion within the right occipital lobe has not significantly changed in size. There is a central focus of non enhancement within this lesion, which is new from the prior MRI of 10/22/2021. Mild surrounding edema has slightly increased. 3 mm enhancing metastasis within the high left parietal lobe (series 18, image 146). In retrospect, this was likely present as a subtle punctate focus of enhancement on the prior brain MRI of 10/22/2021. Unchanged punctate enhancing focus within the left cerebellar hemisphere, favored to reflect vascular enhancement (series 18, image 35). Background mild-to-moderate chronic small vessel ischemic changes within the cerebral white matter, stable. Redemonstrated tiny T2 hyperintense focus within the right cerebellar hemisphere, which may reflect a chronic lacunar infarct (series 10, image 7). There is no acute infarct No chronic intracranial blood products. No extra-axial fluid collection. No midline shift. Vascular: Maintained flow voids within the proximal large arterial vessels. Skull and upper cervical spine: No focal suspicious marrow lesion. Sinuses/Orbits: No mass or acute finding within the imaged orbits. Mild mucosal thickening within the bilateral ethmoid and maxillary sinuses. Superimposed small mucous retention cyst within the right maxillary sinus. Other: Trace fluid within the left mastoid air cells. Known left parotid mass, incompletely imaged. Impression #3 will be called to the ordering clinician or representative by the Radiologist Assistant, and communication documented in the PACS or [employer]. IMPRESSION: Since the prior brain MRI of 10/22/2021,  there has been a continued interval decrease in size of a left frontal lobe metastasis, now measuring 10 mm. Moderate surrounding edema has also decreased. 3-4 mm metastasis within the right occipital lobe, not significantly changed in size. There is now a small focus of central non enhancement within this lesion. Surrounding edema has slightly increased, possibly treatment-related. Attention recommended on imaging follow-up. 3 mm enhancing metastasis within the high left parietal lobe. In retrospect, this was likely present as a subtle punctate focus of enhancement on the prior brain MRI of 10/22/2021. Unchanged punctate focus of enhancement within the left cerebellar hemisphere, which is favored vascular. Attention recommended on follow-up. 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 Electronically Signed   By: Kellie Simmering D.O.   On: 12/10/2021 14:51   MR Brain W Wo Contrast  Result Date: 10/22/2021 CLINICAL DATA:  Lung cancer. Assess response to treatment. Metastatic disease to brain. EXAM: MRI HEAD WITHOUT AND WITH CONTRAST TECHNIQUE: Multiplanar, multiecho pulse sequences of the brain and surrounding structures were obtained without and with intravenous contrast. CONTRAST:  23mL GADAVIST GADOBUTROL 1 MMOL/ML IV SOLN COMPARISON:  MRI head 10/07/2021 FINDINGS: Brain: Largest lesion left posterior frontal subcortical white matter has improved now measuring 12 mm in diameter, previously up to 17 mm. Decreased edema which remains significant. Decreased mass-effect. No midline shift. 3 mm enhancing lesion in the right occipital lobe has improved in size with decreased edema. No new lesions.  No third lesion Chronic microvascular ischemic changes in the white matter. Negative for acute infarct. Negative for hemorrhage. Ventricle size normal. Vascular: Normal arterial flow voids at the skull  base. Skull and upper cervical spine: No focal skeletal lesion identified. Sinuses/Orbits: Negative Other: None IMPRESSION: Interval improvement in 2 enhancing lesions consistent metastatic disease. Decreased edema with both lesions. Decreased mass-effect on  the left with resolution of midline shift seen previously. No new lesions. Electronically Signed   By: Franchot Gallo M.D.   On: 10/22/2021 16:41   MR Brain W and Wo Contrast  Result Date: 10/07/2021 CLINICAL DATA:  Acute neurologic deficit EXAM: MRI HEAD WITHOUT AND WITH CONTRAST TECHNIQUE: Multiplanar, multiecho pulse sequences of the brain and surrounding structures were obtained without and with intravenous contrast. CONTRAST:  37mL GADAVIST GADOBUTROL 1 MMOL/ML IV SOLN COMPARISON:  Brain MRI 10/03/2020 FINDINGS: Brain: No acute infarct. There is a contrast-enhancing mass in the left frontal lobe that measures 1.3 x 1.2 cm. There is a large amount of surrounding edema that causes rightward midline shift of 9 mm. There is a 4 mm contrast enhancing lesion in the right occipital lobe with a small amount of surrounding edema. No acute or chronic hemorrhage. Vascular: Major flow voids are preserved. Skull and upper cervical spine: Normal calvarium and skull base. Visualized upper cervical spine and soft tissues are normal. Sinuses/Orbits:No paranasal sinus fluid levels or advanced mucosal thickening. No mastoid or middle ear effusion. Normal orbits. IMPRESSION: Two contrast enhancing lesions, most consistent with metastatic disease. The largest lesion is in the left frontal lobe and measures up to 1.3 cm with a large amount of surrounding edema and 9 mm rightward midline shift. Electronically Signed   By: Ulyses Jarred M.D.   On: 10/07/2021 22:06   MR CERVICAL SPINE WO CONTRAST  Result Date: 10/02/2021 CLINICAL DATA:  Neck pain and soreness. Headache. Weakness in the right hand. EXAM: MRI CERVICAL SPINE WITHOUT CONTRAST TECHNIQUE: Multiplanar,  multisequence MR imaging of the cervical spine was performed. No intravenous contrast was administered. COMPARISON:  Prior cervical MRI 02/06/2020. CT neck 07/09/2021. MRI brain 10/03/2020. FINDINGS: Alignment: 1 mm degenerative anterolisthesis C7-T1. Vertebrae: Interval ACDF from C4 through C7. Apparent solid union throughout the region based on the MR findings. 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 Posterior Fossa, vertebral arteries, paraspinal tissues: On the sagittal images, within the left inferior cerebellum, 1 could question the presence of a mass lesion. However, there does not appear to be any brain edema and this may simply be a cerebellar folia imaged in plane. However, the patient has a history of lung cancer in therefore a view this with mild suspicion. Nothing was visible here on the CT scan December 2022 or the prior MRI. Particularly if the patient has lung cancer as any potential for recurrence or metastasis, I would suggest MRI of the brain with without contrast. My hope is that this is a spurious finding. Disc levels: The foramen magnum is widely patent. There is ordinary mild osteoarthritis of the C1-2 articulation but no encroachment upon the neural structures. C2-3: Normal interspace. C3-4: Spondylosis with endplate osteophytes and bulging of the disc. Narrowing of the ventral subarachnoid space but no compression of cord. Bilateral bony foraminal stenosis. Findings appear similar to the study of 2021. C4 through C7: Previous ACDF with solid union. Myelomalacia of the cord at C5 as noted above. The canal is narrow, most narrow at the C4-5 level where the AP diameter of the canal is only 6 mm, but there does not appear to be frank ongoing compression of the cord in there should not be any motion in this segment of the spine. C7-T1: Mild bulging of the disc. Bilateral facet degeneration and  hypertrophy allowing 1 mm of anterolisthesis. No canal  stenosis. Mild bilateral foraminal narrowing, not grossly compressive. IMPRESSION: Since the study of 2021, there has been ACDF from C4 through C7 with apparent solid union. There is chronic myelomalacia of the cord at the C5 level with cord volume loss and abnormal T2 signal. The canal is narrow at the C4-5 level, AP diameter only 6 mm, but there does not appear to be ongoing frank compression of the cord and there should not be any spinal motion in this segment. Degenerative spondylosis at C3-4 with endplate osteophytes and bulging of the disc. Narrowing of the ventral subarachnoid space but no compression of the cord. Bilateral foraminal stenosis that could affect either C4 nerve. C7-T1 disc bulge and facet osteoarthritis with 1 mm of anterolisthesis. Mild bilateral foraminal narrowing, not visibly compressive. Mildly suspicious appearance of the left cerebellum, which could possibly indicate the presence of a cerebellar mass. However, I do not see any edema. This patient has a history of lung cancer. If there is felt to be any significant clinical possibility of there being active lung cancer in this patient, I would suggest a brain MRI with and without contrast. I hope this is a spurious finding related to in plane imaging of a cerebellar folia, which I think is the most likely explanation. These results will be called to the ordering clinician or representative by the Radiologist Assistant, and communication documented in the PACS or [employer]. Electronically Signed   By: Nelson Chimes M.D.   On: 10/02/2021 17:18   CT ABDOMEN PELVIS W CONTRAST  Result Date: 09/26/2021 CLINICAL DATA:  Abdominal pain after right lower extremity thrombolysis and angioplasty with left femoral artery access. EXAM: CT ABDOMEN AND PELVIS WITH CONTRAST TECHNIQUE: Multidetector CT imaging of the abdomen and pelvis was performed using the standard protocol following bolus  administration of intravenous contrast. RADIATION DOSE REDUCTION: This exam was performed according to the departmental dose-optimization program which includes automated exposure control, adjustment of the mA and/or kV according to patient size and/or use of iterative reconstruction technique. CONTRAST:  111mL OMNIPAQUE IOHEXOL 300 MG/ML  SOLN COMPARISON:  PET-CT dated September 18, 2020. CT abdomen and pelvis dated Dec 04, 2015. FINDINGS: Lower chest: No acute abnormality. Hepatobiliary: No focal liver abnormality is seen. Small amount of excreted contrast within the gallbladder. No gallstones, gallbladder wall thickening, or biliary dilatation. Pancreas: Unremarkable. No pancreatic ductal dilatation or surrounding inflammatory changes. Spleen: Normal in size without focal abnormality. Adrenals/Urinary Tract: Adrenal glands are unremarkable. Kidneys are normal, without renal calculi, focal lesion, or hydronephrosis. Excreted contrast within both collecting systems, distal ureters, and bladder. Thick-walled bladder with Foley catheter in place. Stomach/Bowel: Stomach is within normal limits. Appendix appears normal. No evidence of bowel wall thickening, distention, or inflammatory changes. Moderate sigmoid colonic diverticulosis. Vascular/Lymphatic: Aortic atherosclerosis. Partially visualized bypass graft in the medial right upper thigh appears patent. Previously stented native right SFA is occluded proximally, as is the visualized portion of the bypass graft arising from the right profunda artery. Small volume hemorrhage in the left groin superficial to the common femoral artery without focal hematoma. Left femoral central venous catheter with tip in the distal common iliac vein. No enlarged abdominal or pelvic lymph nodes. Reproductive: Prostate is unremarkable. Other: No retroperitoneal hematoma.  No ascites or pneumoperitoneum. Musculoskeletal: No acute or significant osseous findings. IMPRESSION: 1. Small volume  hemorrhage in the left groin superficial to the common femoral artery without focal hematoma. No retroperitoneal hematoma. 2. Partially visualized bypass graft in the medial right upper thigh  appears patent. Previously stented native right SFA is occluded proximally, as is the visualized portion of the bypass graft arising from the right profunda artery. 3. Aortic Atherosclerosis (ICD10-I70.0). Electronically Signed   By: Titus Dubin M.D.   On: 09/26/2021 14:54   PERIPHERAL VASCULAR CATHETERIZATION  Result Date: 09/26/2021 See surgical note for result.  PERIPHERAL VASCULAR CATHETERIZATION  Result Date: 09/25/2021 See surgical note for result.  NM PET Image Restag (PS) Skull Base To Thigh  Result Date: 10/17/2021 CLINICAL DATA:  Subsequent treatment strategy for lung carcinoma. Squamous cell carcinoma. EXAM: NUCLEAR MEDICINE PET SKULL BASE TO THIGH TECHNIQUE: 9.9 mCi F-18 FDG was injected intravenously. Full-ring PET imaging was performed from the skull base to thigh after the radiotracer. CT data was obtained and used for attenuation correction and anatomic localization. Fasting blood glucose: 127 mg/dl COMPARISON:  Brain MRI 10/07/2021 FINDINGS: Mediastinal blood pool activity: SUV max 2.63 Liver activity: SUV max NA NECK: Large region photopenia in the LEFT cerebral hemisphere corresponds to edema on comparison MRI. Centrally within this field photopenia there is a focus of hypermetabolic activity SUV max equal 8.7 (image 2). This corresponds to enhancing lesion on comparison MRI No hypermetabolic nodes in the neck. Incidental CT findings: none CHEST: No hypermetabolic mediastinal or hilar nodes. No suspicious pulmonary nodules on the CT scan. Suprahilar peribronchial consolidation mild bronchiectasis. No significant metabolic activity. Findings consistent with post radiation change. Incidental CT findings: Port in the anterior chest wall with tip in distal SVC. ABDOMEN/PELVIS: No abnormal  hypermetabolic activity within the liver, pancreas, adrenal glands, or spleen. No hypermetabolic lymph nodes in the abdomen or pelvis. Incidental CT findings: none SKELETON: Focal metabolic activity associated with the posterior LEFT eleventh rib with SUV max equal 5.5 (image 124). No corresponding lesion on the CT portion exam. Incidental CT findings: Posterior lumbar fusion. IMPRESSION: 1. Post radiation change in RIGHT suprahilar lung without evidence local recurrence. 2. No evidence of lung cancer recurrence or metastasis lung or mediastinum. 3. Single focus of intense metabolic activity associated with the posterior LEFT eleventh rib. No evidence of trauma on the CT portion exam; however, patient reports fall 2 days prior on the LEFT side. Favor benign posttraumatic metabolic activity. 4. Focal uptake in the LEFT frontal lobe corresponds with metastatic lesion identified on comparison MRI. Electronically Signed   By: Suzy Bouchard M.D.   On: 10/17/2021 09:58   VAS [place] ABI WITH/WO TBI  Result Date: 10/23/2021  LOWER EXTREMITY DOPPLER STUDY Patient Name:  CARL BUTNER  Date of Exam:   10/23/2021 Medical Rec #: [id number]            Accession #:    [id number] Date of Birth: October 26, 1955           Patient Gender: M Patient Age:   75 years Exam Location:  [facility] Procedure:      VAS [place] ABI WITH/WO TBI Referring Phys: [name] --------------------------------------------------------------------------------  Indications: Peripheral artery disease, and Routine f/u. Other Factors: ASO s/p multiple interventions and bypass.                11/15/2019 Right thrombectomy of Fempop insitu graft with added                stent throughout.                 12/01/2019: Aortogram and Selctive Right Lower Extremity  Angiogram. 6 mg of TPA delivered in a lysis catheterfrom the                Origin of the Right Femoral -Popliteal bypass down the proximal                ATA with placement  of the lysis.                 12/02/2019: PTA of the Right Posterior Tibial Artery. PTA of the                Right Peroneal Artery. Mechanical Thrombectomy of the Right                Femoral -Popliteal Bypass graft. Mechanical Thrombectomy of the                Right Tibioperoneal Trunk and Posterior Tibial Artery.                 09/25/2021: Right Lower Extremity Angiography third order                catheter placement. PTA of the proximal peroneal and                tibioperoneal trunk to 2.5 mm with an ultra score balloon. PTA of                the Femoral Artery and the proximal anstomosis of the bypass                graft to 4 mm with an ultra score balloon. Administration of 5 mg                of tpa throughout the entire bypass and in the peroneal Artery.                Initiation of continuous tpa infusion Right lower Extremity for                limb salvage. Insertion of a triple lumen catheter Left CFV with                [place] and fluoroscopic guidance.                 09/26/2021: Right LE Angiogram. PTA of the Right Posterior Tibial                Artery with 2.5 mm diameter by 30 cm length angioplasty balloon.                PTA of the distal bypass anastomosis and distal bypass graft                analogous to the Popliteal artery with 4 mm diameter by 8 cm                length Lutonix drug coated angioplasty balloon.  Vascular Interventions: Hx of multiple right lower extremity PTA's/stents.                         09/10/2016 Right femoral to below the knee popliteal                         artery bypass graft placement. 5/01 Right bypass graft &                         mid popliteal  artery PTA & coil embolectomy of large                         branch of bypass graft. 12/09/16 Right distal bypass                         graft PTA/stent and right posterior tibial artery PTA.                         03/23/2020 Rt PTA thrombectomy. Rt Profunda fem artery to                         PTA bypass.  Comparison Study: 01/17/2021 Performing Technologist: Almira Coaster RVS  Examination Guidelines: A complete evaluation includes at minimum, Doppler waveform signals and systolic blood pressure reading at the level of bilateral brachial, anterior tibial, and posterior tibial arteries, when vessel segments are accessible. Bilateral testing is considered an integral part of a complete examination. Photoelectric Plethysmograph (PPG) waveforms and toe systolic pressure readings are included as required and additional duplex testing as needed. Limited examinations for reoccurring indications may be performed as noted.  ABI Findings: +---------+------------------+-----+---------+--------+ Right    Rt Pressure (mmHg)IndexWaveform Comment  +---------+------------------+-----+---------+--------+ Brachial 161                                      +---------+------------------+-----+---------+--------+ ATA      158               0.98 triphasic         +---------+------------------+-----+---------+--------+ PTA      170               1.06 triphasic         +---------+------------------+-----+---------+--------+ Great Toe156               0.97 Normal            +---------+------------------+-----+---------+--------+ +---------+------------------+-----+---------+-------+ Left     Lt Pressure (mmHg)IndexWaveform Comment +---------+------------------+-----+---------+-------+ Brachial 161                                     +---------+------------------+-----+---------+-------+ ATA      201               1.25 triphasic        +---------+------------------+-----+---------+-------+ PTA      214               1.33 triphasic        +---------+------------------+-----+---------+-------+ Great Toe202               1.25 Normal           +---------+------------------+-----+---------+-------+ +-------+-----------+-----------+------------+------------+ ABI/TBIToday's ABIToday's  TBIPrevious ABIPrevious TBI +-------+-----------+-----------+------------+------------+ Right  1.06       .97        1.09        .71          +-------+-----------+-----------+------------+------------+ Left   1.33       1.25       1.10        .70          +-------+-----------+-----------+------------+------------+ Bilateral ABIs appear essentially unchanged compared to prior study on 01/17/2021. Bilateral TBIs appear increased compared to prior  study on 01/17/2021.  Summary: Right: Resting right ankle-brachial index is within normal range. No evidence of significant right lower extremity arterial disease. The right toe-brachial index is normal. Left: Resting left ankle-brachial index is within normal range. No evidence of significant left lower extremity arterial disease. The left toe-brachial index is normal.  *See table(s) above for measurements and observations.  Electronically signed by Levora Dredge MD on 10/23/2021 at 11:48:53 AM.    Final    VAS [place] LOWER EXTREMITY ARTERIAL DUPLEX  Result Date: 09/26/2021 LOWER EXTREMITY ARTERIAL DUPLEX STUDY Patient Name:  Joshua [name]  Date of Exam:   09/20/2021 Medical Rec #: [id number]            Accession #:    [id number] Date of Birth: 1955-11-04           Patient Gender: M Patient Age:   26 years Exam Location:  [facility] Procedure:      VAS [place] LOWER EXTREMITY ARTERIAL DUPLEX Referring Phys: Levora Dredge --------------------------------------------------------------------------------  Indications: Peripheral artery disease, and severe right calf/foot cramping. Other Factors: ASO s/p multiple interventions and bypass.                11/15/2019 Right thrombectomy of Fempop insitu graft with added                stent throughout.                 12/01/2019: Aortogram and Selctive Right Lower Extremity                Angiogram. 6 mg of TPA delivered in a lysis catheterfrom the                Origin of the Right Femoral -Popliteal  bypass down the proximal                ATA with placement of the lysis.                 12/02/2019: PTA of the Right Posterior Tibial Artery. PTA of the                Right Peroneal Artery. Mechanical Thrombectomy of the Right                Femoral -Popliteal Bypass graft. Mechanical Thrombectomy of the                Right Tibioperoneal Trunk and Posterior Tibial Artery.  Vascular Interventions: Hx of multiple right lower extremity PTA's/stents.                         09/10/2016 Right femoral to below the knee popliteal                         artery bypass graft placement. 5/01 Right bypass graft &                         mid popliteal artery PTA & coil embolectomy of large                         branch of bypass graft. 12/09/16 Right distal bypass                         graft PTA/stent and right posterior tibial artery PTA.  03/23/2020 Rt PTA thrombectomy. Rt Profunda fem artery to                         PTA bypass. Current ABI:            not needed Performing Technologist: Concha [place] RVT  Examination Guidelines: A complete evaluation includes B-mode imaging, spectral Doppler, color Doppler, and power Doppler as needed of all accessible portions of each vessel. Bilateral testing is considered an integral part of a complete examination. Limited examinations for reoccurring indications may be performed as noted.  +----------+--------+-----+--------+----------+--------+ RIGHT     PSV cm/sRatioStenosisWaveform  Comments +----------+--------+-----+--------+----------+--------+ CIA Mid   38                   monophasic         +----------+--------+-----+--------+----------+--------+ ATA Mid                occluded                   +----------+--------+-----+--------+----------+--------+ PTA Distal14                   monophasic         +----------+--------+-----+--------+----------+--------+   Summary: Right: Right Fem/ATA BPG is occluded from origin.  See  table(s) above for measurements and observations. Electronically signed by [name] MD on 09/26/2021 at 4:36:58 PM.    Final        ASSESSMENT & PLAN:  1. Squamous cell carcinoma of right lung ([place])   2. Metastasis to brain ([place])   3. Encounter for antineoplastic immunotherapy   4. 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  Follow-up with radiation oncology. Labs reviewed and discussed with patient.  Proceed with Durvalumab today.  CNS metastasis, treated with radiation.   Repeat MRI was reviewed and discussed with patient.  Slight increase of edema right occipital lobe.  Restarted on dexamethasone 4 mg daily.  Recommend patient to continue $RemoveBef'4mg'xDZmFbVvrE$  daily for 1 week, then decrease to $RemoveBef'2mg'bXmBAqHehh$  daily.  I would want to send him a new prescription of 1 mg tablets.  Patient reports that he has leftover 4 mg supply from previous prescriptions and he prefers to take half tablets when he needs to taper down to 2 mg daily. Discussed with radiation oncology Dr. Baruch Gouty about the MRI images.  3 mm enhancing metastasis overall appears stable.  Size is too small to be treated.  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   Continue Synthroid 88 mcg daily.  TSH trending down.  #Severe peripheral artery disease/thrombosis currently on eliquis/Plavix.                                                      All questions were answered. The patient knows to call the clinic with any problems questions or concerns.  cc [name], MD   Return of visit: Lab MD durvalumab in 2  weeks-  [name], MD, PhD  12/13/2021

## 2021-12-13 NOTE — Progress Notes (Signed)
Patient tolerated Durvalumab infusion well, no questions/concerns voiced. Patient stable at discharge. Refused AVS .

## 2021-12-17 ENCOUNTER — Encounter (INDEPENDENT_AMBULATORY_CARE_PROVIDER_SITE_OTHER): Payer: Self-pay | Admitting: Nurse Practitioner

## 2021-12-17 NOTE — Progress Notes (Signed)
Subjective:    Patient ID: Joshua [name], male    DOB: 1955-07-26, 66 y.o.   MRN: [id number] Chief Complaint  Patient presents with   Follow-up    Ultrasound follow up    Joshua [name] is a 66 year old male who presents today due to pain in his right lower extremity.  The patient notes that he began to work out again and this pain happened post workout.  The patient has a notable history of recurrent vascular occlusion.  Patient notes that he has been adherent with his anticoagulation.  He denies significant claudication-like symptoms or rest pain as was consistent with his previous set of symptoms whenever he would have occlusive issues.  Today noninvasive studies show a patent right bypass graft with no evidence of restenosis or thrombus.   Review of Systems  Musculoskeletal:  Positive for myalgias.  All other systems reviewed and are negative.     Objective:   Physical Exam Vitals reviewed.  HENT:     Head: Normocephalic.  Cardiovascular:     Rate and Rhythm: Normal rate.     Pulses:          Dorsalis pedis pulses are detected w/ Doppler on the right side.       Posterior tibial pulses are detected w/ Doppler on the right side.  Pulmonary:     Effort: Pulmonary effort is normal.  Skin:    General: Skin is warm and dry.  Neurological:     Mental Status: He is alert and oriented to person, place, and time.     Gait: Gait abnormal.  Psychiatric:        Mood and Affect: Mood normal.        Behavior: Behavior normal.        Thought Content: Thought content normal.        Judgment: Judgment normal.    BP 124/68 (BP Location: Left Arm)   Pulse 77   Resp 16   Wt 203 lb (92.1 kg)   BMI 28.31 kg/m   Past Medical History:  Diagnosis Date   Allergy    Cancer ([place])    lung   Dyspnea    former smoker. only doe   History of kidney stones    Hypertension    Peripheral vascular disease ([place])    Pre-diabetes    Sleep apnea     Social History    Socioeconomic History   Marital status: Single    Spouse name: Not on file   Number of children: Not on file   Years of education: Not on file   Highest education level: Not on file  Occupational History   Occupation: unemployed  Tobacco Use   Smoking status: Former    Packs/day: 2.00    Years: 38.00    Pack years: 76.00    Types: Cigarettes    Quit date: 12/20/2014    Years since quitting: 6.9   Smokeless tobacco: Never   Tobacco comments:    quit smoking in 2017 most smoked 2   Vaping Use   Vaping Use: Former  Substance and Sexual Activity   Alcohol use: Not Currently   Drug use: No   Sexual activity: Yes  Other Topics Concern   Not on file  Social History Narrative   Not on file   Social Determinants of Health   Financial Resource Strain: Not on file  Food Insecurity: Not on file  Transportation Needs: Not on file  Physical Activity: Not  on file  Stress: Not on file  Social Connections: Not on file  Intimate Partner Violence: Not on file    Past Surgical History:  Procedure Laterality Date   ANTERIOR CERVICAL DECOMP/DISCECTOMY FUSION N/A 03/14/2020   Procedure: ANTERIOR CERVICAL DECOMPRESSION/DISCECTOMY FUSION 3 LEVELS C4-7;  Surgeon: Meade Maw, MD;  Location: [facility] ORS;  Service: Neurosurgery;  Laterality: N/A;   BACK SURGERY  2011   CENTRAL LINE INSERTION Right 09/10/2016   Procedure: CENTRAL LINE INSERTION;  Surgeon: Katha Cabal, MD;  Location: [facility] ORS;  Service: Vascular;  Laterality: Right;   CERVICAL FUSION     C4-c7 on Mar 14, 2020   COLONOSCOPY  2013 ?   COLONOSCOPY N/A 09/09/2021   Procedure: COLONOSCOPY;  Surgeon: [name], DO;  Location: [facility] ENDOSCOPY;  Service: Gastroenterology;  Laterality: N/A;   CORONARY ANGIOPLASTY     ESOPHAGOGASTRODUODENOSCOPY N/A 09/09/2021   Procedure: ESOPHAGOGASTRODUODENOSCOPY (EGD);  Surgeon: [name], DO;  Location: [facility] ENDOSCOPY;  Service: Gastroenterology;  Laterality: N/A;    FEMORAL-POPLITEAL BYPASS GRAFT Right 09/10/2016   Procedure: BYPASS GRAFT FEMORAL-POPLITEAL ARTERY ( BELOW KNEE );  Surgeon: Katha Cabal, MD;  Location: [facility] ORS;  Service: Vascular;  Laterality: Right;   FEMORAL-TIBIAL BYPASS GRAFT Right 03/23/2020   Procedure: BYPASS GRAFT RIGHT FEMORAL- DISTAL TIBIAL ARTERY WITH DISTAL FLOW GRAFT;  Surgeon: Katha Cabal, MD;  Location: [facility] ORS;  Service: Vascular;  Laterality: Right;   LEFT HEART CATH AND CORONARY ANGIOGRAPHY Left 05/16/2021   Procedure: LEFT HEART CATH AND CORONARY ANGIOGRAPHY;  Surgeon: Andrez Grime, MD;  Location: [place] CV LAB;  Service: Cardiovascular;  Laterality: Left;   LOWER EXTREMITY ANGIOGRAPHY Right 11/18/2016   Procedure: Lower Extremity Angiography;  Surgeon: Katha Cabal, MD;  Location: [place] [facility] LAB;  Service: Cardiovascular;  Laterality: Right;   LOWER EXTREMITY ANGIOGRAPHY Right 12/09/2016   Procedure: Lower Extremity Angiography;  Surgeon: Katha Cabal, MD;  Location: [place] CV LAB;  Service: Cardiovascular;  Laterality: Right;   LOWER EXTREMITY ANGIOGRAPHY Right 11/15/2019   Procedure: LOWER EXTREMITY ANGIOGRAPHY;  Surgeon: Katha Cabal, MD;  Location: [place] CV LAB;  Service: Cardiovascular;  Laterality: Right;   LOWER EXTREMITY ANGIOGRAPHY Right 12/01/2019   Procedure: Lower Extremity Angiography;  Surgeon: Algernon Huxley, MD;  Location: [place] CV LAB;  Service: Cardiovascular;  Laterality: Right;   LOWER EXTREMITY ANGIOGRAPHY Right 12/02/2019   Procedure: LOWER EXTREMITY ANGIOGRAPHY;  Surgeon: Katha Cabal, MD;  Location: [place] CV LAB;  Service: Cardiovascular;  Laterality: Right;   LOWER EXTREMITY ANGIOGRAPHY Right 03/23/2020   Procedure: Lower Extremity Angiography;  Surgeon: Katha Cabal, MD;  Location: [place] CV LAB;  Service: Cardiovascular;  Laterality: Right;   LOWER EXTREMITY ANGIOGRAPHY Right 09/25/2021   Procedure: Lower  Extremity Angiography;  Surgeon: Katha Cabal, MD;  Location: [place] [facility] LAB;  Service: Cardiovascular;  Laterality: Right;   LOWER EXTREMITY ANGIOGRAPHY Right 09/26/2021   Procedure: Lower Extremity Angiography;  Surgeon: Algernon Huxley, MD;  Location: [place] [facility] LAB;  Service: Cardiovascular;  Laterality: Right;   LOWER EXTREMITY INTERVENTION  11/18/2016   Procedure: Lower Extremity Intervention;  Surgeon: Katha Cabal, MD;  Location: [place] [facility] LAB;  Service: Cardiovascular;;   PERIPHERAL VASCULAR CATHETERIZATION Right 01/02/2015   Procedure: Lower Extremity Angiography;  Surgeon: Katha Cabal, MD;  Location: [place] [facility];  Service: Cardiovascular;  Laterality: Right;   PERIPHERAL VASCULAR CATHETERIZATION Right 06/18/2015  Procedure: Lower Extremity Angiography;  Surgeon: Algernon Huxley, MD;  Location: [place] [facility] LAB;  Service: Cardiovascular;  Laterality: Right;   PERIPHERAL VASCULAR CATHETERIZATION  06/18/2015   Procedure: Lower Extremity Intervention;  Surgeon: Algernon Huxley, MD;  Location: [place] [facility] LAB;  Service: Cardiovascular;;   PERIPHERAL VASCULAR CATHETERIZATION N/A 10/09/2015   Procedure: Abdominal Aortogram w/Lower Extremity;  Surgeon: Katha Cabal, MD;  Location: [place] [facility] LAB;  Service: Cardiovascular;  Laterality: N/A;   PERIPHERAL VASCULAR CATHETERIZATION  10/09/2015   Procedure: Lower Extremity Intervention;  Surgeon: Katha Cabal, MD;  Location: [place] CV LAB;  Service: Cardiovascular;;   PERIPHERAL VASCULAR CATHETERIZATION Right 10/10/2015   Procedure: Lower Extremity Angiography;  Surgeon: Algernon Huxley, MD;  Location: [place] [facility] LAB;  Service: Cardiovascular;  Laterality: Right;   PERIPHERAL VASCULAR CATHETERIZATION Right 10/10/2015   Procedure: Lower Extremity Intervention;  Surgeon: Algernon Huxley, MD;  Location: [place] CV LAB;  Service: Cardiovascular;  Laterality: Right;   PERIPHERAL VASCULAR  CATHETERIZATION Right 12/03/2015   Procedure: Lower Extremity Angiography;  Surgeon: Algernon Huxley, MD;  Location: [place] CV LAB;  Service: Cardiovascular;  Laterality: Right;   PERIPHERAL VASCULAR CATHETERIZATION Right 12/04/2015   Procedure: Lower Extremity Angiography;  Surgeon: Algernon Huxley, MD;  Location: [place] CV LAB;  Service: Cardiovascular;  Laterality: Right;   PERIPHERAL VASCULAR CATHETERIZATION  12/04/2015   Procedure: Lower Extremity Intervention;  Surgeon: Algernon Huxley, MD;  Location: [place] [facility] LAB;  Service: Cardiovascular;;   PERIPHERAL VASCULAR CATHETERIZATION Right 06/30/2016   Procedure: Lower Extremity Angiography;  Surgeon: Algernon Huxley, MD;  Location: [place] CV LAB;  Service: Cardiovascular;  Laterality: Right;   PERIPHERAL VASCULAR CATHETERIZATION Right 06/25/2016   Procedure: Lower Extremity Angiography;  Surgeon: Katha Cabal, MD;  Location: [place] CV LAB;  Service: Cardiovascular;  Laterality: Right;   PERIPHERAL VASCULAR CATHETERIZATION Right 07/01/2016   Procedure: Lower Extremity Angiography;  Surgeon: Katha Cabal, MD;  Location: [place] CV LAB;  Service: Cardiovascular;  Laterality: Right;   PERIPHERAL VASCULAR CATHETERIZATION  07/01/2016   Procedure: Lower Extremity Intervention;  Surgeon: Katha Cabal, MD;  Location: [place] CV LAB;  Service: Cardiovascular;;   PERIPHERAL VASCULAR CATHETERIZATION Right 08/01/2016   Procedure: Lower Extremity Angiography;  Surgeon: Katha Cabal, MD;  Location: [place] CV LAB;  Service: Cardiovascular;  Laterality: Right;   PORTA CATH INSERTION N/A 10/05/2020   Procedure: PORTA CATH INSERTION;  Surgeon: Katha Cabal, MD;  Location: [place] CV LAB;  Service: Cardiovascular;  Laterality: N/A;   stent placement in right leg Right    VIDEO BRONCHOSCOPY N/A 09/14/2020   Procedure: VIDEO BRONCHOSCOPY WITH FLUORO;  Surgeon: Tyler Pita, MD;  Location: [facility]  ORS;  Service: Cardiopulmonary;  Laterality: N/A;   VIDEO BRONCHOSCOPY WITH ENDOBRONCHIAL ULTRASOUND N/A 09/14/2020   Procedure: VIDEO BRONCHOSCOPY WITH ENDOBRONCHIAL ULTRASOUND;  Surgeon: Tyler Pita, MD;  Location: [facility] ORS;  Service: Cardiopulmonary;  Laterality: N/A;    Family History  Problem Relation Age of Onset   Diabetes Mother    Hypertension Mother    Heart murmur Mother    Leukemia Mother    Throat cancer Maternal Grandmother    Colon cancer Neg Hx    Breast cancer Neg Hx     No Active Allergies     Latest Ref Rng & Units 12/13/2021    8:18 AM 11/29/2021    8:09 AM 11/15/2021  8:57 AM  CBC  WBC 4.0 - 10.5 K/uL 8.5   4.3   5.7    Hemoglobin 13.0 - 17.0 g/dL 10.6   11.7   11.7    Hematocrit 39.0 - 52.0 % 33.4   36.3   36.0    Platelets 150 - 400 K/uL 172   196   144        CMP     Component Value Date/Time   NA 144 12/13/2021 0818   NA 141 10/14/2019 1101   K 3.6 12/13/2021 0818   CL 109 12/13/2021 0818   CO2 27 12/13/2021 0818   GLUCOSE 104 (H) 12/13/2021 0818   BUN 15 12/13/2021 0818   BUN 15 10/14/2019 1101   BUN 16 07/03/2014 1205   CREATININE 0.91 12/13/2021 0818   CREATININE 1.34 (H) 07/03/2014 1205   CALCIUM 8.7 (L) 12/13/2021 0818   PROT 6.6 12/13/2021 0818   PROT 7.0 04/16/2020 1525   ALBUMIN 3.6 12/13/2021 0818   ALBUMIN 4.3 04/16/2020 1525   AST 23 12/13/2021 0818   ALT 18 12/13/2021 0818   ALKPHOS 71 12/13/2021 0818   BILITOT 0.5 12/13/2021 0818   BILITOT 0.4 04/16/2020 1525   GFRNONAA >60 12/13/2021 0818   GFRNONAA 59 (L) 07/03/2014 1205   GFRAA >60 03/30/2020 0536   GFRAA >60 07/03/2014 1205     VAS [place] ABI WITH/WO TBI  Result Date: 10/23/2021  LOWER EXTREMITY DOPPLER STUDY Patient Name:  Joshua [name]  Date of Exam:   10/23/2021 Medical Rec #: [id number]            Accession #:    [id number] Date of Birth: [birth year]           Patient Gender: M Patient Age:   89 years Exam Location:  [facility] Procedure:       VAS [place] ABI WITH/WO TBI Referring Phys: [name] --------------------------------------------------------------------------------  Indications: Peripheral artery disease, and Routine f/u. Other Factors: ASO s/p multiple interventions and bypass.                11/15/2019 Right thrombectomy of Fempop insitu graft with added                stent throughout.                 12/01/2019: Aortogram and Selctive Right Lower Extremity                Angiogram. 6 mg of TPA delivered in a lysis catheterfrom the                Origin of the Right Femoral -Popliteal bypass down the proximal                ATA with placement of the lysis.                 12/02/2019: PTA of the Right Posterior Tibial Artery. PTA of the                Right Peroneal Artery. Mechanical Thrombectomy of the Right                Femoral -Popliteal Bypass graft. Mechanical Thrombectomy of the                Right Tibioperoneal Trunk and Posterior Tibial Artery.                 09/25/2021: Right Lower Extremity Angiography third order  catheter placement. PTA of the proximal peroneal and                tibioperoneal trunk to 2.5 mm with an ultra score balloon. PTA of                the Femoral Artery and the proximal anstomosis of the bypass                graft to 4 mm with an ultra score balloon. Administration of 5 mg                of tpa throughout the entire bypass and in the peroneal Artery.                Initiation of continuous tpa infusion Right lower Extremity for                limb salvage. Insertion of a triple lumen catheter Left CFV with                [place] and fluoroscopic guidance.                 09/26/2021: Right LE Angiogram. PTA of the Right Posterior Tibial                Artery with 2.5 mm diameter by 30 cm length angioplasty balloon.                PTA of the distal bypass anastomosis and distal bypass graft                analogous to the Popliteal artery with 4 mm diameter by 8 cm                length  Lutonix drug coated angioplasty balloon.  Vascular Interventions: Hx of multiple right lower extremity PTA's/stents.                         09/10/2016 Right femoral to below the knee popliteal                         artery bypass graft placement. 5/01 Right bypass graft &                         mid popliteal artery PTA & coil embolectomy of large                         branch of bypass graft. 12/09/16 Right distal bypass                         graft PTA/stent and right posterior tibial artery PTA.                         03/23/2020 Rt PTA thrombectomy. Rt Profunda fem artery to                         PTA bypass. Comparison Study: 01/17/2021 Performing Technologist: Almira Coaster RVS  Examination Guidelines: A complete evaluation includes at minimum, Doppler waveform signals and systolic blood pressure reading at the level of bilateral brachial, anterior tibial, and posterior tibial arteries, when vessel segments are accessible. Bilateral testing is considered an integral part of a complete examination. Photoelectric Plethysmograph (PPG) waveforms and toe systolic  pressure readings are included as required and additional duplex testing as needed. Limited examinations for reoccurring indications may be performed as noted.  ABI Findings: +---------+------------------+-----+---------+--------+ Right    Rt Pressure (mmHg)IndexWaveform Comment  +---------+------------------+-----+---------+--------+ Brachial 161                                      +---------+------------------+-----+---------+--------+ ATA      158               0.98 triphasic         +---------+------------------+-----+---------+--------+ PTA      170               1.06 triphasic         +---------+------------------+-----+---------+--------+ Great Toe156               0.97 Normal            +---------+------------------+-----+---------+--------+ +---------+------------------+-----+---------+-------+ Left     Lt  Pressure (mmHg)IndexWaveform Comment +---------+------------------+-----+---------+-------+ Brachial 161                                     +---------+------------------+-----+---------+-------+ ATA      201               1.25 triphasic        +---------+------------------+-----+---------+-------+ PTA      214               1.33 triphasic        +---------+------------------+-----+---------+-------+ Great Toe202               1.25 Normal           +---------+------------------+-----+---------+-------+ +-------+-----------+-----------+------------+------------+ ABI/TBIToday's ABIToday's TBIPrevious ABIPrevious TBI +-------+-----------+-----------+------------+------------+ Right  1.06       .97        1.09        .71          +-------+-----------+-----------+------------+------------+ Left   1.33       1.25       1.10        .70          +-------+-----------+-----------+------------+------------+ Bilateral ABIs appear essentially unchanged compared to prior study on 01/17/2021. Bilateral TBIs appear increased compared to prior study on 01/17/2021.  Summary: Right: Resting right ankle-brachial index is within normal range. No evidence of significant right lower extremity arterial disease. The right toe-brachial index is normal. Left: Resting left ankle-brachial index is within normal range. No evidence of significant left lower extremity arterial disease. The left toe-brachial index is normal.  *See table(s) above for measurements and observations.  Electronically signed by [name] MD on 10/23/2021 at 11:48:53 AM.    Final        Assessment & Plan:   1. Peripheral arterial disease with history of revascularization ([place]) Noninvasive studies show no evidence of significant occlusion.  Patient will follow-up as previously scheduled for follow-up of his peripheral arterial disease. - VAS [place] LOWER EXTREMITY ARTERIAL DUPLEX  2. Primary hypertension Continue  antihypertensive medications as already ordered, these medications have been reviewed and there are no changes at this time.   3. Mixed hyperlipidemia Continue statin as ordered and reviewed, no changes at this time   4. Right leg pain The patient notes that his leg pain began after especially strenuous workout.  I suspect this may be  some muscle strain.  Noninvasive studies as indicated above do not indicate significant stenosis.  We will attempt to give the patient some muscle relaxer to see if this helps his pain.  He is advised to follow-up with PCP if it continues.    Current Outpatient Medications on File Prior to Visit  Medication Sig Dispense Refill   acetaminophen (TYLENOL) 325 MG tablet Take 650 mg by mouth every 6 (six) hours as needed for moderate pain.     clopidogrel (PLAVIX) 75 MG tablet TAKE 1 TABLET BY MOUTH  DAILY 100 tablet 2   ELIQUIS 5 MG TABS tablet TAKE 1 TABLET BY MOUTH  TWICE DAILY 120 tablet 5   famotidine (PEPCID) 20 MG tablet Take 1 tablet (20 mg total) by mouth daily. Take 1 tablet at bedtime the night before chemo and 1 tablet on the night of chemotherapy. 90 tablet 0   levETIRAcetam (KEPPRA) 500 MG tablet Take 1 tablet (500 mg total) by mouth 2 (two) times daily. 60 tablet 3   levothyroxine (SYNTHROID) 88 MCG tablet Take 1 tablet (88 mcg total) by mouth daily before breakfast. 30 tablet 2   simvastatin (ZOCOR) 40 MG tablet Take 40 mg by mouth at bedtime.     Tiotropium Bromide-Olodaterol (STIOLTO RESPIMAT) 2.5-2.5 MCG/ACT AERS Inhale 2 puffs into the lungs daily. 4 g 0   No current facility-administered medications on file prior to visit.    There are no Patient Instructions on file for this visit. No follow-ups on file.   Kris Hartmann, NP

## 2021-12-18 ENCOUNTER — Telehealth: Payer: Self-pay | Admitting: *Deleted

## 2021-12-18 NOTE — Telephone Encounter (Signed)
Patient called asking if Dr Tasia Catchings has heard from Dr Baruch Gouty regarding his xray

## 2021-12-20 NOTE — Telephone Encounter (Signed)
Hi Ladies, do you happen to know anything about an Xray?

## 2021-12-24 ENCOUNTER — Encounter: Payer: Self-pay | Admitting: Oncology

## 2021-12-26 ENCOUNTER — Other Ambulatory Visit: Payer: Self-pay | Admitting: *Deleted

## 2021-12-26 MED ORDER — FAMOTIDINE 20 MG PO TABS: 20.0000 mg | ORAL_TABLET | Freq: Every day | ORAL | 2 refills | Status: DC

## 2021-12-27 ENCOUNTER — Encounter: Payer: Self-pay | Admitting: Oncology

## 2021-12-27 ENCOUNTER — Inpatient Hospital Stay: Payer: Medicare Other | Attending: Oncology

## 2021-12-27 ENCOUNTER — Inpatient Hospital Stay: Payer: Medicare Other

## 2021-12-27 ENCOUNTER — Inpatient Hospital Stay (HOSPITAL_BASED_OUTPATIENT_CLINIC_OR_DEPARTMENT_OTHER): Payer: Medicare Other | Admitting: Oncology

## 2021-12-27 VITALS — BP 157/83 | HR 67 | Temp 96.5°F | Resp 20 | Wt 206.1 lb

## 2021-12-27 DIAGNOSIS — Z7902 Long term (current) use of antithrombotics/antiplatelets: Secondary | ICD-10-CM | POA: Insufficient documentation

## 2021-12-27 DIAGNOSIS — Z5112 Encounter for antineoplastic immunotherapy: Secondary | ICD-10-CM | POA: Insufficient documentation

## 2021-12-27 DIAGNOSIS — I739 Peripheral vascular disease, unspecified: Secondary | ICD-10-CM | POA: Insufficient documentation

## 2021-12-27 DIAGNOSIS — C3491 Malignant neoplasm of unspecified part of right bronchus or lung: Secondary | ICD-10-CM

## 2021-12-27 DIAGNOSIS — C3411 Malignant neoplasm of upper lobe, right bronchus or lung: Secondary | ICD-10-CM | POA: Diagnosis present

## 2021-12-27 DIAGNOSIS — C7931 Secondary malignant neoplasm of brain: Secondary | ICD-10-CM | POA: Diagnosis present

## 2021-12-27 DIAGNOSIS — K118 Other diseases of salivary glands: Secondary | ICD-10-CM

## 2021-12-27 DIAGNOSIS — Z79890 Hormone replacement therapy (postmenopausal): Secondary | ICD-10-CM | POA: Insufficient documentation

## 2021-12-27 DIAGNOSIS — Z7901 Long term (current) use of anticoagulants: Secondary | ICD-10-CM | POA: Insufficient documentation

## 2021-12-27 DIAGNOSIS — E032 Hypothyroidism due to medicaments and other exogenous substances: Secondary | ICD-10-CM | POA: Diagnosis not present

## 2021-12-27 DIAGNOSIS — J449 Chronic obstructive pulmonary disease, unspecified: Secondary | ICD-10-CM | POA: Diagnosis not present

## 2021-12-27 LAB — CBC WITH DIFFERENTIAL/PLATELET
Abs Immature Granulocytes: 0.01 10*3/uL (ref 0.00–0.07)
Basophils Absolute: 0.1 10*3/uL (ref 0.0–0.1)
Basophils Relative: 1 %
Eosinophils Absolute: 0.1 10*3/uL (ref 0.0–0.5)
Eosinophils Relative: 1 %
HCT: 35.9 % — ABNORMAL LOW (ref 39.0–52.0)
Hemoglobin: 11.6 g/dL — ABNORMAL LOW (ref 13.0–17.0)
Immature Granulocytes: 0 %
Lymphocytes Relative: 15 %
Lymphs Abs: 1.1 10*3/uL (ref 0.7–4.0)
MCH: 29.5 pg (ref 26.0–34.0)
MCHC: 32.3 g/dL (ref 30.0–36.0)
MCV: 91.3 fL (ref 80.0–100.0)
Monocytes Absolute: 0.9 10*3/uL (ref 0.1–1.0)
Monocytes Relative: 12 %
Neutro Abs: 5 10*3/uL (ref 1.7–7.7)
Neutrophils Relative %: 71 %
Platelets: 183 10*3/uL (ref 150–400)
RBC: 3.93 MIL/uL — ABNORMAL LOW (ref 4.22–5.81)
RDW: 14.7 % (ref 11.5–15.5)
WBC: 7.1 10*3/uL (ref 4.0–10.5)
nRBC: 0 % (ref 0.0–0.2)

## 2021-12-27 LAB — COMPREHENSIVE METABOLIC PANEL
ALT: 19 U/L (ref 0–44)
AST: 19 U/L (ref 15–41)
Albumin: 3.7 g/dL (ref 3.5–5.0)
Alkaline Phosphatase: 52 U/L (ref 38–126)
Anion gap: 8 (ref 5–15)
BUN: 34 mg/dL — ABNORMAL HIGH (ref 8–23)
CO2: 25 mmol/L (ref 22–32)
Calcium: 8.4 mg/dL — ABNORMAL LOW (ref 8.9–10.3)
Chloride: 107 mmol/L (ref 98–111)
Creatinine, Ser: 0.97 mg/dL (ref 0.61–1.24)
GFR, Estimated: >60 mL/min (ref >60)
Glucose, Bld: 107 mg/dL — ABNORMAL HIGH (ref 70–99)
Potassium: 4 mmol/L (ref 3.5–5.1)
Sodium: 140 mmol/L (ref 135–145)
Total Bilirubin: 0.5 mg/dL (ref 0.3–1.2)
Total Protein: 6.8 g/dL (ref 6.5–8.1)

## 2021-12-27 MED ORDER — OYSTER SHELL CALCIUM/D3 500-5 MG-MCG PO TABS: 2.0000 | ORAL_TABLET | Freq: Every day | ORAL | 1 refills | Status: DC

## 2021-12-27 MED ORDER — SODIUM CHLORIDE 0.9 % IV SOLN
10.0000 mg/kg | Freq: Once | INTRAVENOUS | Status: AC
  Administered 2021-12-27: 860 mg via INTRAVENOUS
  Filled 2021-12-27: qty 10

## 2021-12-27 MED ORDER — SODIUM CHLORIDE 0.9 % IV SOLN
2.0000 g | Freq: Once | INTRAVENOUS | Status: DC
  Filled 2021-12-27: qty 20

## 2021-12-27 MED ORDER — HEPARIN SOD (PORK) LOCK FLUSH 100 UNIT/ML IV SOLN
500.0000 [IU] | Freq: Once | INTRAVENOUS | Status: AC | PRN
  Administered 2021-12-27: 500 [IU]
  Filled 2021-12-27: qty 5

## 2021-12-27 MED ORDER — SODIUM CHLORIDE 0.9 % IV SOLN
Freq: Once | INTRAVENOUS | Status: AC
  Filled 2021-12-27: qty 250

## 2021-12-27 NOTE — Patient Instructions (Signed)
[facility] CANCER CTR AT [place]  Discharge Instructions: Thank you for choosing [facility] to provide your oncology and hematology care.  If you have a lab appointment with the [facility], please go directly to the [facility] and check in at the registration area.  Wear comfortable clothing and clothing appropriate for easy access to any Portacath or PICC line.   We strive to give you quality time with your provider. You may need to reschedule your appointment if you arrive late (15 or more minutes).  Arriving late affects you and other patients whose appointments are after yours.  Also, if you miss three or more appointments without notifying the office, you may be dismissed from the clinic at the provider's discretion.      For prescription refill requests, have your pharmacy contact our office and allow 72 hours for refills to be completed.    Today you received the following chemotherapy and/or immunotherapy agents Imfinzi.      To help prevent nausea and vomiting after your treatment, we encourage you to take your nausea medication as directed.  BELOW ARE SYMPTOMS THAT SHOULD BE REPORTED IMMEDIATELY: *FEVER GREATER THAN 100.4 F (38 C) OR HIGHER *CHILLS OR SWEATING *NAUSEA AND VOMITING THAT IS NOT CONTROLLED WITH YOUR NAUSEA MEDICATION *UNUSUAL SHORTNESS OF BREATH *UNUSUAL BRUISING OR BLEEDING *URINARY PROBLEMS (pain or burning when urinating, or frequent urination) *BOWEL PROBLEMS (unusual diarrhea, constipation, pain near the anus) TENDERNESS IN MOUTH AND THROAT WITH OR WITHOUT PRESENCE OF ULCERS (sore throat, sores in mouth, or a toothache) UNUSUAL RASH, SWELLING OR PAIN  UNUSUAL VAGINAL DISCHARGE OR ITCHING   Items with * indicate a potential emergency and should be followed up as soon as possible or go to the Emergency Department if any problems should occur.  Please show the CHEMOTHERAPY ALERT CARD or IMMUNOTHERAPY ALERT CARD at check-in to  the Emergency Department and triage nurse.  Should you have questions after your visit or need to cancel or reschedule your appointment, please contact [facility] CANCER [place] AT [place]  [phone] and follow the prompts.  Office hours are 8:00 a.m. to 4:30 p.m. Monday - Friday. Please note that voicemails left after 4:00 p.m. may not be returned until the following business day.  We are closed weekends and major holidays. You have access to a nurse at all times for urgent questions. Please call the main number to the clinic [phone] and follow the prompts.  For any non-urgent questions, you may also contact your provider using MyChart. We now offer e-Visits for anyone 64 and older to request care online for non-urgent symptoms. For details visit [email or web address].   Also download the MyChart app! Go to the app store, search "MyChart", open the app, select [facility], and log in with your MyChart username and password.  Due to Covid, a mask is required upon entering the hospital/clinic. If you do not have a mask, one will be given to you upon arrival. For doctor visits, patients may have 1 support person aged 64 or older with them. For treatment visits, patients cannot have anyone with them due to current Covid guidelines and our immunocompromised population.

## 2021-12-28 ENCOUNTER — Encounter: Payer: Self-pay | Admitting: Oncology

## 2021-12-28 DIAGNOSIS — J449 Chronic obstructive pulmonary disease, unspecified: Secondary | ICD-10-CM | POA: Insufficient documentation

## 2021-12-28 NOTE — Assessment & Plan Note (Deleted)
Continue Synthroid 88 mcg daily

## 2021-12-28 NOTE — Assessment & Plan Note (Signed)
Continue Eliquis and Plavix.

## 2021-12-28 NOTE — Assessment & Plan Note (Signed)
Recommend patient to take Calcium 1200mg  daily.

## 2021-12-28 NOTE — Assessment & Plan Note (Signed)
Labs reviewed and discussed with patient.  Proceed with Durvalumab

## 2021-12-28 NOTE — Progress Notes (Signed)
Hematology/Oncology Progress note [phone] [phone]      Patient Care Team: [name], MD as PCP - General (Physician Assistant) [name], MD as Referring Physician (Physician Assistant) Bary Castilla, [name], MD (General Surgery) Telford Nab, RN as Oncology Nurse Navigator  REFERRING PROVIDER: [name], MD  CHIEF COMPLAINTS/REASON FOR VISIT:  Follow up for lung cancer  HISTORY OF PRESENTING ILLNESS:  Joshua [name] is a 66 y.o. male who has above history reviewed by me today presents for follow up visit for Stage IV lung squamous cell carcinoma. Oncology History  Squamous cell carcinoma of right lung ([place])  09/18/2020 Imaging   PET scan showed right upper lobe nodule maximal SUV 11.5.  Hypermetabolic cluster right paratracheal adenopathy with maximum SUV up to 12.  No findings of metastatic disease to the neck/abdomen/pelvis or skeleton.   09/20/2020 Initial Diagnosis   Squamous cell carcinoma of right lung (HCC) NGS showed PD-L1 TPS 95%, LRP1B S950fs, PIK3CA E545K, RB1 c1422-2A>T, RIT1 T83_ A84del, STK11 P218fs, TPS53 R248W, TMB 19.17mut/mb, MS stable.    09/20/2020 Cancer Staging   Staging form: Lung, AJCC 8th Edition - Clinical stage from 09/20/2020: Stage IV (cT1b, cN2, cM1) - Signed by [name], MD on 12/13/2021   10/03/2020 Imaging   MRI brain with and without contrast showed no evidence of intracranial metastatic disease. Well-defined round lesion within the left parotid tail measuring approximately 2.3 x 1.8 x 1.8 cm   10/09/2020 - 11/27/2020 Radiation Therapy   concurrent chemoradiation   10/10/2020 - 10/31/2020 Chemotherapy   10/10/2020 concurrent chemoradiation.  carboplatin AUC of 2 and Taxol 45 mg/m2 x 4.  Additional chemotherapy was held due to thrombocytopenia, neutropenia       12/31/2020 -  Chemotherapy   Maintenance  Durvalumab q14d      03/28/2021 Imaging   CT without contrast showed new linear  right perihilar consolidation with associated traction bronchiectasis.  Likely postradiation changes.  Additional new peribronchial vascular/nodular groundglass opacities with areas of consolidation or seen primarily in the right upper and lower lobes, more distant from expected radiation fields. 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  Per my discussion with pulmonology Dr. 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     05/09/2021 Imaging   Repeat CT chest without contrast showed evolving radiation changes in the right perihilar region likely accounting for interval increase to thickening of the posterior wall of the right upper lobe bronchus.  The more peripheral right lung groundglass opacities seen on most recent study have generally improved with exception of one peripherally right upper lobe which appears new.  These are likely inflammatory.  No findings highly suspicious for local recurrence.   05/30/2021 -  Chemotherapy   Patient is on Treatment Plan :  LUNG Durvalumab q14d     10/07/2021 Imaging   MRI brain with and without contrast showed 2 contrast-enhancing lesions, most consistent with metastatic disease.  Largest lesion is in the left frontal lobe and measures up to 1.3 cm with a large amount of surrounding edema and 9 mm rightward midline shift.  The other enhancing lesion was 4 mm in the right occipital lobe with a small amount of surrounding edema.  No acute or chronic hemorrhage.   10/07/2021 Progression   Headache after colonoscopy, progressive difficulty using her right arm.  Progressively worsening of speech. Stage IV lung cancer with brain  metastasis.  started on Dexamethasone.  He was seen by neurosurgery and radiation oncology.  Recommendation is to proceed with SRS.  Patient was discharged with dexamethasone   10/16/2021 Imaging   PET scan  showed postradiation changes in the right supra hilar lung without evidence of local recurrence.  No evidence of lung cancer recurrence or metastatic disease.  Single focus of intense metabolic activity associated with posterior left 11th rib likely secondary to recent fall/posttraumatic metabolic changes.  Frontal  uptake in the left frontal lobe corresponding to the known metastatic lesion in the brain   10/29/2021 -  Radiation Therapy   Brain Radiation [facility]   12/10/2021 Imaging   Brain MRI: Continued interval decrease in size of a left frontal lobe metastasis, now measuring 10 mm. Moderate surrounding edema has also decreased.  3-4 mm metastasis within the right occipital lobe, not significantly changed in size. There is now a small focus of central non enhancement within this lesion. Increased edema. 3 mm enhancing metastasis within the high left parietal lobe. In retrospect, this was likely present as a subtle punctate focus of enhancement on the prior brain MRI of 10/22/2021.        Review of Systems  Constitutional:  Positive for fatigue. Negative for appetite change, chills, fever and unexpected weight change.  HENT:   Negative for hearing loss and voice change.   Eyes:  Negative for eye problems and icterus.  Respiratory:  Negative for chest tightness, cough and shortness of breath.   Cardiovascular:  Negative for chest pain and leg swelling.  Gastrointestinal:  Negative for abdominal distention and abdominal pain.  Endocrine: Negative for hot flashes.  Genitourinary:  Negative for difficulty urinating, dysuria and frequency.   Musculoskeletal:  Negative for arthralgias.  Skin:  Negative for itching and rash.  Neurological:  Negative for light-headedness and numbness.  Hematological:  Negative for adenopathy. Does not bruise/bleed easily.  Psychiatric/Behavioral:  Negative for confusion.       MEDICAL HISTORY:  Past Medical History:  Diagnosis Date   Allergy    Cancer ([place])     lung   Dyspnea    former smoker. only doe   History of kidney stones    Hypertension    Peripheral vascular disease ([place])    Pre-diabetes    Sleep apnea     SURGICAL HISTORY: Past Surgical History:  Procedure Laterality Date   ANTERIOR CERVICAL DECOMP/DISCECTOMY FUSION N/A 03/14/2020   Procedure: ANTERIOR CERVICAL DECOMPRESSION/DISCECTOMY FUSION 3 LEVELS C4-7;  Surgeon: Meade Maw, MD;  Location: [facility] ORS;  Service: Neurosurgery;  Laterality: N/A;   BACK SURGERY  2011   CENTRAL LINE INSERTION Right 09/10/2016   Procedure: CENTRAL LINE INSERTION;  Surgeon: Katha Cabal, MD;  Location: [facility] ORS;  Service: Vascular;  Laterality: Right;   CERVICAL FUSION     C4-c7 on Mar 14, 2020   COLONOSCOPY  2013 ?   COLONOSCOPY N/A 09/09/2021   Procedure: COLONOSCOPY;  Surgeon: [name], DO;  Location: [facility] ENDOSCOPY;  Service: Gastroenterology;  Laterality: N/A;   CORONARY ANGIOPLASTY     ESOPHAGOGASTRODUODENOSCOPY N/A 09/09/2021   Procedure: ESOPHAGOGASTRODUODENOSCOPY (EGD);  Surgeon: [name], DO;  Location: [facility] ENDOSCOPY;  Service: Gastroenterology;  Laterality: N/A;   FEMORAL-POPLITEAL BYPASS GRAFT Right 09/10/2016   Procedure: BYPASS GRAFT FEMORAL-POPLITEAL ARTERY ( BELOW KNEE );  Surgeon: Katha Cabal, MD;  Location: [facility] ORS;  Service: Vascular;  Laterality: Right;   FEMORAL-TIBIAL BYPASS GRAFT Right 03/23/2020   Procedure: BYPASS GRAFT  RIGHT FEMORAL- DISTAL TIBIAL ARTERY WITH DISTAL FLOW GRAFT;  Surgeon: Katha Cabal, MD;  Location: [facility] ORS;  Service: Vascular;  Laterality: Right;   LEFT HEART CATH AND CORONARY ANGIOGRAPHY Left 05/16/2021   Procedure: LEFT HEART CATH AND CORONARY ANGIOGRAPHY;  Surgeon: Andrez Grime, MD;  Location: [place] [facility];  Service: Cardiovascular;  Laterality: Left;   LOWER EXTREMITY ANGIOGRAPHY Right 11/18/2016   Procedure: Lower Extremity Angiography;  Surgeon: Katha Cabal, MD;  Location: [place] [facility];  Service: Cardiovascular;  Laterality: Right;   LOWER EXTREMITY ANGIOGRAPHY Right 12/09/2016   Procedure: Lower Extremity Angiography;  Surgeon: Katha Cabal, MD;  Location: [place] [facility];  Service: Cardiovascular;  Laterality: Right;   LOWER EXTREMITY ANGIOGRAPHY Right 11/15/2019   Procedure: LOWER EXTREMITY ANGIOGRAPHY;  Surgeon: Katha Cabal, MD;  Location: [place] [facility];  Service: Cardiovascular;  Laterality: Right;   LOWER EXTREMITY ANGIOGRAPHY Right 12/01/2019   Procedure: Lower Extremity Angiography;  Surgeon: Algernon Huxley, MD;  Location: [place] [facility];  Service: Cardiovascular;  Laterality: Right;   LOWER EXTREMITY ANGIOGRAPHY Right 12/02/2019   Procedure: LOWER EXTREMITY ANGIOGRAPHY;  Surgeon: Katha Cabal, MD;  Location: [place] [facility];  Service: Cardiovascular;  Laterality: Right;   LOWER EXTREMITY ANGIOGRAPHY Right 03/23/2020   Procedure: Lower Extremity Angiography;  Surgeon: Katha Cabal, MD;  Location: [place] [facility];  Service: Cardiovascular;  Laterality: Right;   LOWER EXTREMITY ANGIOGRAPHY Right 09/25/2021   Procedure: Lower Extremity Angiography;  Surgeon: Katha Cabal, MD;  Location: [place] [facility];  Service: Cardiovascular;  Laterality: Right;   LOWER EXTREMITY ANGIOGRAPHY Right 09/26/2021   Procedure: Lower Extremity Angiography;  Surgeon: Algernon Huxley, MD;  Location: [place] [facility];  Service: Cardiovascular;  Laterality: Right;   LOWER EXTREMITY INTERVENTION  11/18/2016   Procedure: Lower Extremity Intervention;  Surgeon: Katha Cabal, MD;  Location: [place] [facility];  Service: Cardiovascular;;   PERIPHERAL VASCULAR CATHETERIZATION Right 01/02/2015   Procedure: Lower Extremity Angiography;  Surgeon: Katha Cabal, MD;  Location: [place] [facility];  Service: Cardiovascular;  Laterality: Right;   PERIPHERAL VASCULAR CATHETERIZATION Right 06/18/2015   Procedure: Lower Extremity  Angiography;  Surgeon: Algernon Huxley, MD;  Location: [place] [facility];  Service: Cardiovascular;  Laterality: Right;   PERIPHERAL VASCULAR CATHETERIZATION  06/18/2015   Procedure: Lower Extremity Intervention;  Surgeon: Algernon Huxley, MD;  Location: [place] [facility];  Service: Cardiovascular;;   PERIPHERAL VASCULAR CATHETERIZATION N/A 10/09/2015   Procedure: Abdominal Aortogram w/Lower Extremity;  Surgeon: Katha Cabal, MD;  Location: [place] [facility];  Service: Cardiovascular;  Laterality: N/A;   PERIPHERAL VASCULAR CATHETERIZATION  10/09/2015   Procedure: Lower Extremity Intervention;  Surgeon: Katha Cabal, MD;  Location: [place] [facility];  Service: Cardiovascular;;   PERIPHERAL VASCULAR CATHETERIZATION Right 10/10/2015   Procedure: Lower Extremity Angiography;  Surgeon: Algernon Huxley, MD;  Location: [place] [facility];  Service: Cardiovascular;  Laterality: Right;   PERIPHERAL VASCULAR CATHETERIZATION Right 10/10/2015   Procedure: Lower Extremity Intervention;  Surgeon: Algernon Huxley, MD;  Location: [place] [facility];  Service: Cardiovascular;  Laterality: Right;   PERIPHERAL VASCULAR CATHETERIZATION Right 12/03/2015   Procedure: Lower Extremity Angiography;  Surgeon: Algernon Huxley, MD;  Location: [place] [facility];  Service: Cardiovascular;  Laterality: Right;   PERIPHERAL VASCULAR CATHETERIZATION Right 12/04/2015   Procedure: Lower Extremity Angiography;  Surgeon: Erskine Squibb  Lucky Cowboy, MD;  Location: [place] [facility];  Service: Cardiovascular;  Laterality: Right;   PERIPHERAL VASCULAR CATHETERIZATION  12/04/2015   Procedure: Lower Extremity Intervention;  Surgeon: Algernon Huxley, MD;  Location: [place] [facility];  Service: Cardiovascular;;   PERIPHERAL VASCULAR CATHETERIZATION Right 06/30/2016   Procedure: Lower Extremity Angiography;  Surgeon: Algernon Huxley, MD;  Location: [place] [facility];  Service: Cardiovascular;  Laterality: Right;   PERIPHERAL VASCULAR CATHETERIZATION  Right 06/25/2016   Procedure: Lower Extremity Angiography;  Surgeon: Katha Cabal, MD;  Location: [place] [facility];  Service: Cardiovascular;  Laterality: Right;   PERIPHERAL VASCULAR CATHETERIZATION Right 07/01/2016   Procedure: Lower Extremity Angiography;  Surgeon: Katha Cabal, MD;  Location: [place] [facility];  Service: Cardiovascular;  Laterality: Right;   PERIPHERAL VASCULAR CATHETERIZATION  07/01/2016   Procedure: Lower Extremity Intervention;  Surgeon: Katha Cabal, MD;  Location: [place] [facility];  Service: Cardiovascular;;   PERIPHERAL VASCULAR CATHETERIZATION Right 08/01/2016   Procedure: Lower Extremity Angiography;  Surgeon: Katha Cabal, MD;  Location: [place] [facility];  Service: Cardiovascular;  Laterality: Right;   PORTA CATH INSERTION N/A 10/05/2020   Procedure: PORTA CATH INSERTION;  Surgeon: Katha Cabal, MD;  Location: [place] [facility];  Service: Cardiovascular;  Laterality: N/A;   stent placement in right leg Right    VIDEO BRONCHOSCOPY N/A 09/14/2020   Procedure: VIDEO BRONCHOSCOPY WITH FLUORO;  Surgeon: Tyler Pita, MD;  Location: [facility]S;  Service: Cardiopulmonary;  Laterality: N/A;   VIDEO BRONCHOSCOPY WITH ENDOBRONCHIAL ULTRASOUND N/A 09/14/2020   Procedure: VIDEO BRONCHOSCOPY WITH ENDOBRONCHIAL ULTRASOUND;  Surgeon: Tyler Pita, MD;  Location: [facility]S;  Service: Cardiopulmonary;  Laterality: N/A;    SOCIAL HISTORY: Social History   Socioeconomic History   Marital status: Single    Spouse name: Not on file   Number of children: Not on file   Years of education: Not on file   Highest education level: Not on file  Occupational History   Occupation: unemployed  Tobacco Use   Smoking status: Former    Packs/day: 2.00    Years: 38.00    Total pack years: 76.00    Types: Cigarettes    Quit date: 12/20/2014    Years since quitting: 7.0   Smokeless tobacco: Never   Tobacco comments:    quit smoking in 2017  most smoked 2   Vaping Use   Vaping Use: Former  Substance and Sexual Activity   Alcohol use: Not Currently   Drug use: No   Sexual activity: Yes  Other Topics Concern   Not on file  Social History Narrative   Not on file   Social Determinants of Health   Financial Resource Strain: Not on file  Food Insecurity: Not on file  Transportation Needs: Not on file  Physical Activity: Not on file  Stress: Not on file  Social Connections: Not on file  Intimate Partner Violence: Not on file    FAMILY HISTORY: Family History  Problem Relation Age of Onset   Diabetes Mother    Hypertension Mother    Heart murmur Mother    Leukemia Mother    Throat cancer Maternal Grandmother    Colon cancer Neg Hx    Breast cancer Neg Hx     ALLERGIES:  has no active allergies.  MEDICATIONS:  Current Outpatient Medications  Medication Sig Dispense Refill   acetaminophen (TYLENOL) 325 MG tablet Take 650 mg by mouth  every 6 (six) hours as needed for moderate pain.     calcium-vitamin D (OSCAL WITH D) 500-5 MG-MCG tablet Take 2 tablets by mouth daily. 60 tablet 1   clopidogrel (PLAVIX) 75 MG tablet TAKE 1 TABLET BY MOUTH  DAILY 100 tablet 2   ELIQUIS 5 MG TABS tablet TAKE 1 TABLET BY MOUTH  TWICE DAILY 120 tablet 5   famotidine (PEPCID) 20 MG tablet Take 1 tablet (20 mg total) by mouth daily. Take 1 tablet at bedtime the night before chemo and 1 tablet on the night of chemotherapy. 90 tablet 2   levETIRAcetam (KEPPRA) 500 MG tablet Take 1 tablet (500 mg total) by mouth 2 (two) times daily. 60 tablet 3   levothyroxine (SYNTHROID) 88 MCG tablet Take 1 tablet (88 mcg total) by mouth daily before breakfast. 30 tablet 2   simvastatin (ZOCOR) 40 MG tablet Take 40 mg by mouth at bedtime.     Tiotropium Bromide-Olodaterol (STIOLTO RESPIMAT) 2.5-2.5 MCG/ACT AERS Inhale 2 puffs into the lungs daily. 4 g 0   baclofen (LIORESAL) 10 MG tablet Take 1 tablet (10 mg total) by mouth 3 (three) times daily. 30 each 0    dexamethasone (DECADRON) 4 MG tablet Take 1 tablet (4 mg total) by mouth daily. (Patient not taking: Reported on 12/27/2021) 14 tablet 0   No current facility-administered medications for this visit.     PHYSICAL EXAMINATION: ECOG PERFORMANCE STATUS: 1 - Symptomatic but completely ambulatory Vitals:   12/27/21 0838  BP: (!) 157/83  Pulse: 67  Resp: 20  Temp: (!) 96.5 F (35.8 C)  SpO2: 100%   Filed Weights   12/27/21 0838  Weight: 206 lb 1.6 oz (93.5 kg)    Physical Exam Constitutional:      General: He is not in acute distress.    Appearance: He is not diaphoretic.     Comments: Patient walks with a cane  HENT:     Head: Normocephalic and atraumatic.     Nose: Nose normal.     Mouth/Throat:     Pharynx: No oropharyngeal exudate.  Eyes:     General: No scleral icterus.    Pupils: Pupils are equal, round, and reactive to light.  Cardiovascular:     Rate and Rhythm: Normal rate and regular rhythm.     Heart sounds: No murmur heard. Pulmonary:     Effort: Pulmonary effort is normal. No respiratory distress.     Breath sounds: No rales.  Chest:     Chest wall: No tenderness.  Abdominal:     General: There is no distension.     Palpations: Abdomen is soft.     Tenderness: There is no abdominal tenderness.  Musculoskeletal:        General: Normal range of motion.     Cervical back: Normal range of motion and neck supple.  Skin:    General: Skin is warm and dry.     Findings: No erythema.  Neurological:     Mental Status: He is alert and oriented to person, place, and time.     Cranial Nerves: No cranial nerve deficit.     Motor: No abnormal muscle tone.     Coordination: Coordination normal.  Psychiatric:        Mood and Affect: Affect normal.      LABORATORY DATA:  I have reviewed the data as listed Lab Results  Component Value Date   WBC 7.1 12/27/2021   HGB 11.6 (L) 12/27/2021  HCT 35.9 (L) 12/27/2021   MCV 91.3 12/27/2021   PLT 183 12/27/2021    Recent Labs    11/29/21 0809 12/13/21 0818 12/27/21 0825  NA 144 144 140  K 3.9 3.6 4.0  CL 109 109 107  CO2 $Re'24 27 25  'aev$ GLUCOSE 130* 104* 107*  BUN 25* 15 34*  CREATININE 1.43* 0.91 0.97  CALCIUM 8.6* 8.7* 8.4*  GFRNONAA 55* >60 >60  PROT 7.1 6.6 6.8  ALBUMIN 3.7 3.6 3.7  AST $Re'26 23 19  'ScG$ ALT $R'25 18 19  'KD$ ALKPHOS 72 71 52  BILITOT 0.6 0.5 0.5    Iron/TIBC/Ferritin/ %Sat    Component Value Date/Time   IRON 48 08/30/2020 1052   TIBC 391 08/30/2020 1052   FERRITIN 25 08/30/2020 1052   IRONPCTSAT 12 (L) 08/30/2020 1052      RADIOGRAPHIC STUDIES: I have personally reviewed the radiological images as listed and agreed with the findings in the report. VAS [place] LOWER EXTREMITY ARTERIAL DUPLEX  Result Date: 12/18/2021 LOWER EXTREMITY ARTERIAL DUPLEX STUDY Patient Name:  Joshua [name]  Date of Exam:   12/11/2021 Medical Rec #: [id number]            Accession #:    [id number] Date of Birth: 1956-04-11           Patient Gender: M Patient Age:   34 years Exam Location:  [facility] Procedure:      VAS [place] LOWER EXTREMITY ARTERIAL DUPLEX Referring Phys: Eulogio Ditch --------------------------------------------------------------------------------  Indications: Peripheral artery disease, and pain post workout. Other Factors: ASO s/p multiple interventions and bypass.                11/15/2019 Right thrombectomy of Fempop insitu graft with added                stent throughout.                 12/01/2019: Aortogram and Selctive Right Lower Extremity                Angiogram. 6 mg of TPA delivered in a lysis catheterfrom the                Origin of the Right Femoral -Popliteal bypass down the proximal                ATA with placement of the lysis.                 12/02/2019: PTA of the Right Posterior Tibial Artery. PTA of the                Right Peroneal Artery. Mechanical Thrombectomy of the Right                Femoral -Popliteal Bypass graft. Mechanical Thrombectomy of the                 Right Tibioperoneal Trunk and Posterior Tibial Artery.                 09/25/2021: Right Lower Extremity Angiography third order                catheter placement. PTA of the proximal peroneal and                tibioperoneal trunk to 2.5 mm with an ultra score balloon. PTA of                the Femoral Artery and  the proximal anstomosis of the bypass                graft to 4 mm with an ultra score balloon. Administration of 5 mg                of tpa throughout the entire bypass and in the peroneal Artery.                Initiation of continuous tpa infusion Right lower Extremity for                limb salvage. Insertion of a triple lumen catheter Left CFV with                [place] and fluoroscopic guidance.                 09/26/2021: Right LE Angiogram. PTA of the Right Posterior Tibial                Artery with 2.5 mm diameter by 30 cm length angioplasty balloon.                PTA of the distal bypass anastomosis and distal bypass graft                analogous to the Popliteal artery with 4 mm diameter by 8 cm                length Lutonix drug coated angioplasty balloon.  Vascular Interventions: Hx of multiple right lower extremity PTA's/stents.                         09/10/2016 Right femoral to below the knee popliteal                         artery bypass graft placement. 5/01 Right bypass graft &                         mid popliteal artery PTA & coil embolectomy of large                         branch of bypass graft. 12/09/16 Right distal bypass                         graft PTA/stent and right posterior tibial artery PTA.                         03/23/2020 Rt PTA thrombectomy. Rt Profunda fem artery to                         PTA bypass. Current ABI:            n/a Performing Technologist: Concha [place] RVT  Examination Guidelines: A complete evaluation includes B-mode imaging, spectral Doppler, color Doppler, and power Doppler as needed of all accessible portions of each vessel. Bilateral testing is  considered an integral part of a complete examination. Limited examinations for reoccurring indications may be performed as noted.  +-----------+--------+-----+--------+----------+--------+ RIGHT      PSV cm/sRatioStenosisWaveform  Comments +-----------+--------+-----+--------+----------+--------+ CFA Mid    57                                      +-----------+--------+-----+--------+----------+--------+  DFA        40                                      +-----------+--------+-----+--------+----------+--------+ ATA Distal 105                  monophasic         +-----------+--------+-----+--------+----------+--------+ PTA Distal 11                   biphasic           +-----------+--------+-----+--------+----------+--------+ PERO Distal11                   monophasic         +-----------+--------+-----+--------+----------+--------+  Right Graft #1: fempta +------------------+--------+--------+--------+--------+                   PSV cm/sStenosisWaveformComments +------------------+--------+--------+--------+--------+ Inflow            57                               +------------------+--------+--------+--------+--------+ Prox Anastomosis  29                               +------------------+--------+--------+--------+--------+ Proximal Graft    37                               +------------------+--------+--------+--------+--------+ Mid Graft         30                               +------------------+--------+--------+--------+--------+ Distal Graft      27                               +------------------+--------+--------+--------+--------+ Distal Anastomosis37                               +------------------+--------+--------+--------+--------+ Outflow           50                               +------------------+--------+--------+--------+--------+   Summary: Right: Patent BPG with no evidence of stenosis.  See  table(s) above for measurements and observations. Electronically signed by Leotis Pain MD on 12/18/2021 at 9:15:55 AM.    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 EXAM: MRI HEAD WITHOUT AND WITH CONTRAST TECHNIQUE: Multiplanar, multiecho pulse sequences of the brain and surrounding structures were obtained without and with intravenous contrast. CONTRAST:  46mL GADAVIST GADOBUTROL 1 MMOL/ML IV SOLN COMPARISON:  Prior brain MRI examinations 10/22/2021 and earlier. FINDINGS: Brain: No age advanced or lobar predominant parenchymal atrophy. Continued interval decrease in size of an enhancing metastasis the subcortical white matter of the posterolateral left frontal lobe, now measuring 10 mm (previously measuring 12 mm) (series 18, image 107). Moderate surrounding vasogenic edema has also decreased. A 3-4 mm enhancing lesion within the right occipital  lobe has not significantly changed in size. There is a central focus of non enhancement within this lesion, which is new from the prior MRI of 10/22/2021. Mild surrounding edema has slightly increased. 3 mm enhancing metastasis within the high left parietal lobe (series 18, image 146). In retrospect, this was likely present as a subtle punctate focus of enhancement on the prior brain MRI of 10/22/2021. Unchanged punctate enhancing focus within the left cerebellar hemisphere, favored to reflect vascular enhancement (series 18, image 35). Background mild-to-moderate chronic small vessel ischemic changes within the cerebral white matter, stable. Redemonstrated tiny T2 hyperintense focus within the right cerebellar hemisphere, which may reflect a chronic lacunar infarct (series 10, image 7). There is no acute infarct No chronic intracranial blood products. No extra-axial fluid collection. No midline shift. Vascular: Maintained flow voids within the proximal large arterial  vessels. Skull and upper cervical spine: No focal suspicious marrow lesion. Sinuses/Orbits: No mass or acute finding within the imaged orbits. Mild mucosal thickening within the bilateral ethmoid and maxillary sinuses. Superimposed small mucous retention cyst within the right maxillary sinus. Other: Trace fluid within the left mastoid air cells. Known left parotid mass, incompletely imaged. Impression #3 will be called to the ordering clinician or representative by the Radiologist Assistant, and communication documented in the PACS or [employer]. IMPRESSION: Since the prior brain MRI of 10/22/2021, there has been a continued interval decrease in size of a left frontal lobe metastasis, now measuring 10 mm. Moderate surrounding edema has also decreased. 3-4 mm metastasis within the right occipital lobe, not significantly changed in size. There is now a small focus of central non enhancement within this lesion. Surrounding edema has slightly increased, possibly treatment-related. Attention recommended on imaging follow-up. 3 mm enhancing metastasis within the high left parietal lobe. In retrospect, this was likely present as a subtle punctate focus of enhancement on the prior brain MRI of 10/22/2021. Unchanged punctate focus of enhancement within the left cerebellar hemisphere, which is favored vascular. Attention recommended on follow-up. 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 Electronically Signed   By: Kellie Simmering D.O.   On: 12/10/2021 14:51   VAS [place] ABI WITH/WO TBI  Result Date: 10/23/2021  LOWER EXTREMITY DOPPLER STUDY Patient Name:  Joshua [name]  Date of Exam:   10/23/2021 Medical Rec #: [id number]            Accession #:    [id number] Date of Birth: 1956/06/11           Patient Gender: M Patient Age:   18 years Exam Location:  [facility] Procedure:      VAS  [place] ABI WITH/WO TBI Referring Phys: [name] --------------------------------------------------------------------------------  Indications: Peripheral artery disease, and Routine f/u. Other Factors: ASO s/p multiple interventions and bypass.                11/15/2019 Right thrombectomy of Fempop insitu graft with added                stent throughout.                 12/01/2019: Aortogram and Selctive Right Lower Extremity                Angiogram. 6 mg of TPA delivered in a lysis catheterfrom the                Origin of  the Right Femoral -Popliteal bypass down the proximal                ATA with placement of the lysis.                 12/02/2019: PTA of the Right Posterior Tibial Artery. PTA of the                Right Peroneal Artery. Mechanical Thrombectomy of the Right                Femoral -Popliteal Bypass graft. Mechanical Thrombectomy of the                Right Tibioperoneal Trunk and Posterior Tibial Artery.                 09/25/2021: Right Lower Extremity Angiography third order                catheter placement. PTA of the proximal peroneal and                tibioperoneal trunk to 2.5 mm with an ultra score balloon. PTA of                the Femoral Artery and the proximal anstomosis of the bypass                graft to 4 mm with an ultra score balloon. Administration of 5 mg                of tpa throughout the entire bypass and in the peroneal Artery.                Initiation of continuous tpa infusion Right lower Extremity for                limb salvage. Insertion of a triple lumen catheter Left CFV with                [place] and fluoroscopic guidance.                 09/26/2021: Right LE Angiogram. PTA of the Right Posterior Tibial                Artery with 2.5 mm diameter by 30 cm length angioplasty balloon.                PTA of the distal bypass anastomosis and distal bypass graft                analogous to the Popliteal artery with 4 mm diameter by 8 cm                length Lutonix  drug coated angioplasty balloon.  Vascular Interventions: Hx of multiple right lower extremity PTA's/stents.                         09/10/2016 Right femoral to below the knee popliteal                         artery bypass graft placement. 5/01 Right bypass graft &                         mid popliteal artery PTA & coil embolectomy of large  branch of bypass graft. 12/09/16 Right distal bypass                         graft PTA/stent and right posterior tibial artery PTA.                         03/23/2020 Rt PTA thrombectomy. Rt Profunda fem artery to                         PTA bypass. Comparison Study: 01/17/2021 Performing Technologist: Almira Coaster RVS  Examination Guidelines: A complete evaluation includes at minimum, Doppler waveform signals and systolic blood pressure reading at the level of bilateral brachial, anterior tibial, and posterior tibial arteries, when vessel segments are accessible. Bilateral testing is considered an integral part of a complete examination. Photoelectric Plethysmograph (PPG) waveforms and toe systolic pressure readings are included as required and additional duplex testing as needed. Limited examinations for reoccurring indications may be performed as noted.  ABI Findings: +---------+------------------+-----+---------+--------+ Right    Rt Pressure (mmHg)IndexWaveform Comment  +---------+------------------+-----+---------+--------+ Brachial 161                                      +---------+------------------+-----+---------+--------+ ATA      158               0.98 triphasic         +---------+------------------+-----+---------+--------+ PTA      170               1.06 triphasic         +---------+------------------+-----+---------+--------+ Great Toe156               0.97 Normal            +---------+------------------+-----+---------+--------+ +---------+------------------+-----+---------+-------+ Left     Lt Pressure  (mmHg)IndexWaveform Comment +---------+------------------+-----+---------+-------+ Brachial 161                                     +---------+------------------+-----+---------+-------+ ATA      201               1.25 triphasic        +---------+------------------+-----+---------+-------+ PTA      214               1.33 triphasic        +---------+------------------+-----+---------+-------+ Great Toe202               1.25 Normal           +---------+------------------+-----+---------+-------+ +-------+-----------+-----------+------------+------------+ ABI/TBIToday's ABIToday's TBIPrevious ABIPrevious TBI +-------+-----------+-----------+------------+------------+ Right  1.06       .97        1.09        .71          +-------+-----------+-----------+------------+------------+ Left   1.33       1.25       1.10        .70          +-------+-----------+-----------+------------+------------+ Bilateral ABIs appear essentially unchanged compared to prior study on 01/17/2021. Bilateral TBIs appear increased compared to prior study on 01/17/2021.  Summary: Right: Resting right ankle-brachial index is within normal range. No evidence of significant right lower extremity arterial disease. The right toe-brachial index is normal. Left: Resting left  ankle-brachial index is within normal range. No evidence of significant left lower extremity arterial disease. The left toe-brachial index is normal.  *See table(s) above for measurements and observations.  Electronically signed by Levora Dredge MD on 10/23/2021 at 11:48:53 AM.    Final    MR Brain W Wo Contrast  Result Date: 10/22/2021 CLINICAL DATA:  Lung cancer. Assess response to treatment. Metastatic disease to brain. EXAM: MRI HEAD WITHOUT AND WITH CONTRAST TECHNIQUE: Multiplanar, multiecho pulse sequences of the brain and surrounding structures were obtained without and with intravenous contrast. CONTRAST:  42mL GADAVIST  GADOBUTROL 1 MMOL/ML IV SOLN COMPARISON:  MRI head 10/07/2021 FINDINGS: Brain: Largest lesion left posterior frontal subcortical white matter has improved now measuring 12 mm in diameter, previously up to 17 mm. Decreased edema which remains significant. Decreased mass-effect. No midline shift. 3 mm enhancing lesion in the right occipital lobe has improved in size with decreased edema. No new lesions.  No third lesion Chronic microvascular ischemic changes in the white matter. Negative for acute infarct. Negative for hemorrhage. Ventricle size normal. Vascular: Normal arterial flow voids at the skull base. Skull and upper cervical spine: No focal skeletal lesion identified. Sinuses/Orbits: Negative Other: None IMPRESSION: Interval improvement in 2 enhancing lesions consistent metastatic disease. Decreased edema with both lesions. Decreased mass-effect on the left with resolution of midline shift seen previously. No new lesions. Electronically Signed   By: Marlan Palau M.D.   On: 10/22/2021 16:41   NM PET Image Restag (PS) Skull Base To Thigh  Result Date: 10/17/2021 CLINICAL DATA:  Subsequent treatment strategy for lung carcinoma. Squamous cell carcinoma. EXAM: NUCLEAR MEDICINE PET SKULL BASE TO THIGH TECHNIQUE: 9.9 mCi F-18 FDG was injected intravenously. Full-ring PET imaging was performed from the skull base to thigh after the radiotracer. CT data was obtained and used for attenuation correction and anatomic localization. Fasting blood glucose: 127 mg/dl COMPARISON:  Brain MRI 10/07/2021 FINDINGS: Mediastinal blood pool activity: SUV max 2.63 Liver activity: SUV max NA NECK: Large region photopenia in the LEFT cerebral hemisphere corresponds to edema on comparison MRI. Centrally within this field photopenia there is a focus of hypermetabolic activity SUV max equal 8.7 (image 2). This corresponds to enhancing lesion on comparison MRI No hypermetabolic nodes in the neck. Incidental CT findings: none CHEST: No  hypermetabolic mediastinal or hilar nodes. No suspicious pulmonary nodules on the CT scan. Suprahilar peribronchial consolidation mild bronchiectasis. No significant metabolic activity. Findings consistent with post radiation change. Incidental CT findings: Port in the anterior chest wall with tip in distal SVC. ABDOMEN/PELVIS: No abnormal hypermetabolic activity within the liver, pancreas, adrenal glands, or spleen. No hypermetabolic lymph nodes in the abdomen or pelvis. Incidental CT findings: none SKELETON: Focal metabolic activity associated with the posterior LEFT eleventh rib with SUV max equal 5.5 (image 124). No corresponding lesion on the CT portion exam. Incidental CT findings: Posterior lumbar fusion. IMPRESSION: 1. Post radiation change in RIGHT suprahilar lung without evidence local recurrence. 2. No evidence of lung cancer recurrence or metastasis lung or mediastinum. 3. Single focus of intense metabolic activity associated with the posterior LEFT eleventh rib. No evidence of trauma on the CT portion exam; however, patient reports fall 2 days prior on the LEFT side. Favor benign posttraumatic metabolic activity. 4. Focal uptake in the LEFT frontal lobe corresponds with metastatic lesion identified on comparison MRI. Electronically Signed   By: Genevive Bi M.D.   On: 10/17/2021 09:58   MR Brain W and Wo Contrast  Result Date: 10/07/2021 CLINICAL DATA:  Acute neurologic deficit EXAM: MRI HEAD WITHOUT AND WITH CONTRAST TECHNIQUE: Multiplanar, multiecho pulse sequences of the brain and surrounding structures were obtained without and with intravenous contrast. CONTRAST:  4mL GADAVIST GADOBUTROL 1 MMOL/ML IV SOLN COMPARISON:  Brain MRI 10/03/2020 FINDINGS: Brain: No acute infarct. There is a contrast-enhancing mass in the left frontal lobe that measures 1.3 x 1.2 cm. There is a large amount of surrounding edema that causes rightward midline shift of 9 mm. There is a 4 mm contrast enhancing lesion  in the right occipital lobe with a small amount of surrounding edema. No acute or chronic hemorrhage. Vascular: Major flow voids are preserved. Skull and upper cervical spine: Normal calvarium and skull base. Visualized upper cervical spine and soft tissues are normal. Sinuses/Orbits:No paranasal sinus fluid levels or advanced mucosal thickening. No mastoid or middle ear effusion. Normal orbits. IMPRESSION: Two contrast enhancing lesions, most consistent with metastatic disease. The largest lesion is in the left frontal lobe and measures up to 1.3 cm with a large amount of surrounding edema and 9 mm rightward midline shift. Electronically Signed   By: Ulyses Jarred M.D.   On: 10/07/2021 22:06   MR CERVICAL SPINE WO CONTRAST  Result Date: 10/02/2021 CLINICAL DATA:  Neck pain and soreness. Headache. Weakness in the right hand. EXAM: MRI CERVICAL SPINE WITHOUT CONTRAST TECHNIQUE: Multiplanar, multisequence MR imaging of the cervical spine was performed. No intravenous contrast was administered. COMPARISON:  Prior cervical MRI 02/06/2020. CT neck 07/09/2021. MRI brain 10/03/2020. FINDINGS: Alignment: 1 mm degenerative anterolisthesis C7-T1. Vertebrae: Interval ACDF from C4 through C7. Apparent solid union throughout the region based on the MR findings. 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 Posterior Fossa, vertebral arteries, paraspinal tissues: On the sagittal images, within the left inferior cerebellum, 1 could question the presence of a mass lesion. However, there does not appear to be any brain edema and this may simply be a cerebellar folia imaged in plane. However, the patient has a history of lung cancer in therefore a view this with mild suspicion. Nothing was visible here on the CT scan December 2022 or the prior MRI. Particularly if the patient has lung cancer as any potential for recurrence or metastasis, I  would suggest MRI of the brain with without contrast. My hope is that this is a spurious finding. Disc levels: The foramen magnum is widely patent. There is ordinary mild osteoarthritis of the C1-2 articulation but no encroachment upon the neural structures. C2-3: Normal interspace. C3-4: Spondylosis with endplate osteophytes and bulging of the disc. Narrowing of the ventral subarachnoid space but no compression of cord. Bilateral bony foraminal stenosis. Findings appear similar to the study of 2021. C4 through C7: Previous ACDF with solid union. Myelomalacia of the cord at C5 as noted above. The canal is narrow, most narrow at the C4-5 level where the AP diameter of the canal is only 6 mm, but there does not appear to be frank ongoing compression of the cord in there should not be any motion in this segment of the spine. C7-T1: Mild bulging of the disc. Bilateral facet degeneration and hypertrophy allowing 1 mm of anterolisthesis. No canal stenosis. Mild bilateral foraminal narrowing, not grossly compressive. IMPRESSION: Since the study of 2021, there has been ACDF from C4 through C7 with apparent solid union. 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 The canal is narrow at the C4-5 level, AP diameter only 6 mm, but there does not appear to be ongoing frank compression of the cord and there should not be any spinal motion in this segment. Degenerative spondylosis at C3-4 with endplate osteophytes and bulging of the disc. Narrowing of the ventral subarachnoid space but no compression of the cord. Bilateral foraminal stenosis that could affect either C4 nerve. C7-T1 disc bulge and facet osteoarthritis with 1 mm of anterolisthesis. Mild bilateral foraminal narrowing, not visibly compressive. Mildly suspicious appearance of the left cerebellum, which could possibly indicate the presence of a cerebellar mass. However, I do not see any edema. This patient has  a history of lung cancer. If there is felt to be any significant clinical possibility of there being active lung cancer in this patient, I would suggest a brain MRI with and without contrast. I hope this is a spurious finding related to in plane imaging of a cerebellar folia, which I think is the most likely explanation. These results will be called to the ordering clinician or representative by the Radiologist Assistant, and communication documented in the PACS or [employer]. Electronically Signed   By: Nelson Chimes M.D.   On: 10/02/2021 17:18       ASSESSMENT & PLAN:   Cancer Staging  Squamous cell carcinoma of right lung [facility]) Staging form: Lung, AJCC 8th Edition - Clinical stage from 09/20/2020: Stage IV (cT1b, cN2, cM1) - Signed by [name], MD on 12/13/2021  COPD (chronic obstructive pulmonary disease) ([place]) Follows up with pulmonology Dr.Gonzalez. He is on Stiolto  Squamous cell carcinoma of right lung ([place]) Labs reviewed and discussed with patient.  Proceed with Durvalumab  PVD (peripheral vascular disease) (HCC) Continue Eliquis and Plavix.   Malignant neoplasm metastatic to brain [facility]) S/p SRS. Discussed with radiation oncology Dr. Baruch Gouty about the MRI images.  3 mm enhancing metastasis overall appears stable.  Size is too small to be treated. Further taper Dexamethasone to $RemoveBeforeDE'2mg'QqSWRiyJzcOkwak$  daily for 1 week, followed by $RemoveBef'2mg'pRyIGkYxAh$  every other day for 1 week then stop.   Hypothyroidism due to medication Continue Synthroid 88 mcg daily  Hypocalcemia Recommend patient to take Calcium $RemoveBefo'1200mg'QnNxnoxjgwG$  daily.                                                      All questions were answered. The patient knows to call the clinic with any problems questions or concerns.  cc [name], MD   Return of visit: Lab MD durvalumab in 2 weeks-  [name], MD, PhD  12/28/2021

## 2021-12-28 NOTE — Assessment & Plan Note (Signed)
Continue Synthroid 88 mcg daily

## 2021-12-28 NOTE — Assessment & Plan Note (Signed)
Follows up with pulmonology Dr.Gonzalez. He is on [employer]

## 2021-12-28 NOTE — Assessment & Plan Note (Addendum)
S/p SRS. Discussed with radiation oncology Dr. Baruch Gouty about the MRI images.  3 mm enhancing metastasis overall appears stable.  Size is too small to be treated. Further taper Dexamethasone to 2mg  daily for 1 week, followed by 2mg  every other day for 1 week then stop.

## 2022-01-01 ENCOUNTER — Other Ambulatory Visit: Payer: Self-pay | Admitting: *Deleted

## 2022-01-02 ENCOUNTER — Encounter: Payer: Self-pay | Admitting: Oncology

## 2022-01-02 MED ORDER — LEVETIRACETAM 500 MG PO TABS: 500.0000 mg | ORAL_TABLET | Freq: Two times a day (BID) | ORAL | 3 refills | Status: DC

## 2022-01-10 ENCOUNTER — Inpatient Hospital Stay (HOSPITAL_BASED_OUTPATIENT_CLINIC_OR_DEPARTMENT_OTHER): Payer: Medicare Other | Admitting: Oncology

## 2022-01-10 ENCOUNTER — Encounter: Payer: Self-pay | Admitting: Oncology

## 2022-01-10 ENCOUNTER — Other Ambulatory Visit: Payer: Medicare Other

## 2022-01-10 ENCOUNTER — Inpatient Hospital Stay: Payer: Medicare Other

## 2022-01-10 DIAGNOSIS — C7931 Secondary malignant neoplasm of brain: Secondary | ICD-10-CM

## 2022-01-10 DIAGNOSIS — C3491 Malignant neoplasm of unspecified part of right bronchus or lung: Secondary | ICD-10-CM | POA: Diagnosis not present

## 2022-01-10 DIAGNOSIS — E032 Hypothyroidism due to medicaments and other exogenous substances: Secondary | ICD-10-CM | POA: Diagnosis not present

## 2022-01-10 DIAGNOSIS — I739 Peripheral vascular disease, unspecified: Secondary | ICD-10-CM

## 2022-01-10 DIAGNOSIS — Z5112 Encounter for antineoplastic immunotherapy: Secondary | ICD-10-CM | POA: Diagnosis not present

## 2022-01-10 LAB — CBC WITH DIFFERENTIAL/PLATELET
Abs Immature Granulocytes: 0.02 10*3/uL (ref 0.00–0.07)
Basophils Absolute: 0 10*3/uL (ref 0.0–0.1)
Basophils Relative: 1 %
Eosinophils Absolute: 0.2 10*3/uL (ref 0.0–0.5)
Eosinophils Relative: 5 %
HCT: 36.9 % — ABNORMAL LOW (ref 39.0–52.0)
Hemoglobin: 11.9 g/dL — ABNORMAL LOW (ref 13.0–17.0)
Immature Granulocytes: 1 %
Lymphocytes Relative: 12 %
Lymphs Abs: 0.5 10*3/uL — ABNORMAL LOW (ref 0.7–4.0)
MCH: 28.6 pg (ref 26.0–34.0)
MCHC: 32.2 g/dL (ref 30.0–36.0)
MCV: 88.7 fL (ref 80.0–100.0)
Monocytes Absolute: 0.4 10*3/uL (ref 0.1–1.0)
Monocytes Relative: 10 %
Neutro Abs: 2.9 10*3/uL (ref 1.7–7.7)
Neutrophils Relative %: 71 %
Platelets: 157 10*3/uL (ref 150–400)
RBC: 4.16 MIL/uL — ABNORMAL LOW (ref 4.22–5.81)
RDW: 13.9 % (ref 11.5–15.5)
WBC: 4 10*3/uL (ref 4.0–10.5)
nRBC: 0 % (ref 0.0–0.2)

## 2022-01-10 LAB — COMPREHENSIVE METABOLIC PANEL
ALT: 25 U/L (ref 0–44)
AST: 25 U/L (ref 15–41)
Albumin: 3.5 g/dL (ref 3.5–5.0)
Alkaline Phosphatase: 53 U/L (ref 38–126)
Anion gap: 8 (ref 5–15)
BUN: 20 mg/dL (ref 8–23)
CO2: 25 mmol/L (ref 22–32)
Calcium: 8.6 mg/dL — ABNORMAL LOW (ref 8.9–10.3)
Chloride: 103 mmol/L (ref 98–111)
Creatinine, Ser: 1.12 mg/dL (ref 0.61–1.24)
GFR, Estimated: >60 mL/min (ref >60)
Glucose, Bld: 114 mg/dL — ABNORMAL HIGH (ref 70–99)
Potassium: 3.7 mmol/L (ref 3.5–5.1)
Sodium: 136 mmol/L (ref 135–145)
Total Bilirubin: 0.5 mg/dL (ref 0.3–1.2)
Total Protein: 7.2 g/dL (ref 6.5–8.1)

## 2022-01-10 MED ORDER — SODIUM CHLORIDE 0.9 % IV SOLN
Freq: Once | INTRAVENOUS | Status: AC
  Filled 2022-01-10: qty 250

## 2022-01-10 MED ORDER — SODIUM CHLORIDE 0.9 % IV SOLN
10.0000 mg/kg | Freq: Once | INTRAVENOUS | Status: AC
  Administered 2022-01-10: 860 mg via INTRAVENOUS
  Filled 2022-01-10: qty 10

## 2022-01-10 MED ORDER — HEPARIN SOD (PORK) LOCK FLUSH 100 UNIT/ML IV SOLN
500.0000 [IU] | Freq: Once | INTRAVENOUS | Status: AC | PRN
  Administered 2022-01-10: 500 [IU]
  Filled 2022-01-10: qty 5

## 2022-01-11 ENCOUNTER — Encounter: Payer: Self-pay | Admitting: Oncology

## 2022-01-17 ENCOUNTER — Ambulatory Visit
Admission: RE | Admit: 2022-01-17 | Discharge: 2022-01-17 | Disposition: A | Discharge status: Home / Self Care | Payer: Medicare Other | Source: Ambulatory Visit | Attending: Oncology | Admitting: Oncology

## 2022-01-17 DIAGNOSIS — C3491 Malignant neoplasm of unspecified part of right bronchus or lung: Secondary | ICD-10-CM | POA: Diagnosis present

## 2022-01-17 MED ORDER — IOHEXOL 300 MG/ML  SOLN
100.0000 mL | Freq: Once | INTRAMUSCULAR | Status: AC | PRN
  Administered 2022-01-17: 100 mL via INTRAVENOUS

## 2022-01-17 MED ORDER — HEPARIN SOD (PORK) LOCK FLUSH 100 UNIT/ML IV SOLN
INTRAVENOUS | Status: AC
  Filled 2022-01-17: qty 5

## 2022-01-17 MED ORDER — HEPARIN SOD (PORK) LOCK FLUSH 100 UNIT/ML IV SOLN
500.0000 [IU] | Freq: Once | INTRAVENOUS | Status: AC
Start: 2022-01-17 — End: 2022-01-17
  Administered 2022-01-17: 500 [IU] via INTRAVENOUS
  Filled 2022-01-17: qty 5

## 2022-01-22 ENCOUNTER — Encounter (INDEPENDENT_AMBULATORY_CARE_PROVIDER_SITE_OTHER): Payer: Medicare Other

## 2022-01-22 ENCOUNTER — Ambulatory Visit (INDEPENDENT_AMBULATORY_CARE_PROVIDER_SITE_OTHER): Payer: Medicare Other | Admitting: Nurse Practitioner

## 2022-01-24 ENCOUNTER — Telehealth: Payer: Self-pay

## 2022-01-24 ENCOUNTER — Inpatient Hospital Stay: Payer: Medicare Other

## 2022-01-24 ENCOUNTER — Inpatient Hospital Stay (HOSPITAL_BASED_OUTPATIENT_CLINIC_OR_DEPARTMENT_OTHER): Payer: Medicare Other | Admitting: Oncology

## 2022-01-24 ENCOUNTER — Inpatient Hospital Stay: Payer: Medicare Other | Attending: Oncology

## 2022-01-24 ENCOUNTER — Encounter: Payer: Self-pay | Admitting: Oncology

## 2022-01-24 DIAGNOSIS — C3411 Malignant neoplasm of upper lobe, right bronchus or lung: Secondary | ICD-10-CM | POA: Insufficient documentation

## 2022-01-24 DIAGNOSIS — E032 Hypothyroidism due to medicaments and other exogenous substances: Secondary | ICD-10-CM

## 2022-01-24 DIAGNOSIS — C7931 Secondary malignant neoplasm of brain: Secondary | ICD-10-CM

## 2022-01-24 DIAGNOSIS — Z79890 Hormone replacement therapy (postmenopausal): Secondary | ICD-10-CM | POA: Insufficient documentation

## 2022-01-24 DIAGNOSIS — Z95828 Presence of other vascular implants and grafts: Secondary | ICD-10-CM

## 2022-01-24 DIAGNOSIS — J189 Pneumonia, unspecified organism: Secondary | ICD-10-CM

## 2022-01-24 DIAGNOSIS — I739 Peripheral vascular disease, unspecified: Secondary | ICD-10-CM

## 2022-01-24 DIAGNOSIS — C3491 Malignant neoplasm of unspecified part of right bronchus or lung: Secondary | ICD-10-CM | POA: Diagnosis not present

## 2022-01-24 DIAGNOSIS — K219 Gastro-esophageal reflux disease without esophagitis: Secondary | ICD-10-CM | POA: Diagnosis not present

## 2022-01-24 DIAGNOSIS — J440 Chronic obstructive pulmonary disease with acute lower respiratory infection: Secondary | ICD-10-CM | POA: Diagnosis not present

## 2022-01-24 DIAGNOSIS — Z7901 Long term (current) use of anticoagulants: Secondary | ICD-10-CM | POA: Diagnosis not present

## 2022-01-24 DIAGNOSIS — Z452 Encounter for adjustment and management of vascular access device: Secondary | ICD-10-CM | POA: Insufficient documentation

## 2022-01-24 LAB — CBC WITH DIFFERENTIAL/PLATELET
Abs Immature Granulocytes: 0.02 10*3/uL (ref 0.00–0.07)
Basophils Absolute: 0.1 10*3/uL (ref 0.0–0.1)
Basophils Relative: 2 %
Eosinophils Absolute: 0.1 10*3/uL (ref 0.0–0.5)
Eosinophils Relative: 3 %
HCT: 37.3 % — ABNORMAL LOW (ref 39.0–52.0)
Hemoglobin: 12 g/dL — ABNORMAL LOW (ref 13.0–17.0)
Immature Granulocytes: 1 %
Lymphocytes Relative: 13 %
Lymphs Abs: 0.6 10*3/uL — ABNORMAL LOW (ref 0.7–4.0)
MCH: 29 pg (ref 26.0–34.0)
MCHC: 32.2 g/dL (ref 30.0–36.0)
MCV: 90.1 fL (ref 80.0–100.0)
Monocytes Absolute: 0.6 10*3/uL (ref 0.1–1.0)
Monocytes Relative: 13 %
Neutro Abs: 3.1 10*3/uL (ref 1.7–7.7)
Neutrophils Relative %: 68 %
Platelets: 285 10*3/uL (ref 150–400)
RBC: 4.14 MIL/uL — ABNORMAL LOW (ref 4.22–5.81)
RDW: 14.5 % (ref 11.5–15.5)
WBC: 4.4 10*3/uL (ref 4.0–10.5)
nRBC: 0 % (ref 0.0–0.2)

## 2022-01-24 LAB — TSH: TSH: 70.454 u[IU]/mL — ABNORMAL HIGH (ref 0.350–4.500)

## 2022-01-24 LAB — COMPREHENSIVE METABOLIC PANEL
ALT: 17 U/L (ref 0–44)
AST: 23 U/L (ref 15–41)
Albumin: 3.7 g/dL (ref 3.5–5.0)
Alkaline Phosphatase: 62 U/L (ref 38–126)
Anion gap: 10 (ref 5–15)
BUN: 22 mg/dL (ref 8–23)
CO2: 25 mmol/L (ref 22–32)
Calcium: 9.2 mg/dL (ref 8.9–10.3)
Chloride: 105 mmol/L (ref 98–111)
Creatinine, Ser: 1.2 mg/dL (ref 0.61–1.24)
GFR, Estimated: >60 mL/min (ref >60)
Glucose, Bld: 168 mg/dL — ABNORMAL HIGH (ref 70–99)
Potassium: 3.7 mmol/L (ref 3.5–5.1)
Sodium: 140 mmol/L (ref 135–145)
Total Bilirubin: 0.6 mg/dL (ref 0.3–1.2)
Total Protein: 7.1 g/dL (ref 6.5–8.1)

## 2022-01-24 LAB — T4, FREE: Free T4: 0.92 ng/dL (ref 0.61–1.12)

## 2022-01-24 MED ORDER — HEPARIN SOD (PORK) LOCK FLUSH 100 UNIT/ML IV SOLN
500.0000 [IU] | Freq: Once | INTRAVENOUS | Status: AC
  Administered 2022-01-24: 500 [IU] via INTRAVENOUS
  Filled 2022-01-24: qty 5

## 2022-01-24 MED ORDER — SODIUM CHLORIDE 0.9% FLUSH
10.0000 mL | Freq: Once | INTRAVENOUS | Status: AC
  Administered 2022-01-24: 10 mL via INTRAVENOUS
  Filled 2022-01-24: qty 10

## 2022-01-24 NOTE — Telephone Encounter (Signed)
Patient called stating he thought Dr.Yu said something about starting a steroid and he wasn't sure about if he was supposed one start it or not.   Call back # [phone]

## 2022-01-24 NOTE — Telephone Encounter (Signed)
Please let patient know Dr. Tasia Catchings is waiting to hear from Dr. Patsey Berthold to discuss. We will notify patient once we know.

## 2022-01-24 NOTE — Telephone Encounter (Signed)
Left VM explaining details.

## 2022-01-25 ENCOUNTER — Encounter: Payer: Self-pay | Admitting: Oncology

## 2022-01-25 DIAGNOSIS — J984 Other disorders of lung: Secondary | ICD-10-CM | POA: Insufficient documentation

## 2022-01-25 DIAGNOSIS — J189 Pneumonia, unspecified organism: Secondary | ICD-10-CM | POA: Insufficient documentation

## 2022-01-25 DIAGNOSIS — K219 Gastro-esophageal reflux disease without esophagitis: Secondary | ICD-10-CM | POA: Insufficient documentation

## 2022-01-25 MED ORDER — OMEPRAZOLE 40 MG PO CPDR: 40.0000 mg | DELAYED_RELEASE_CAPSULE | Freq: Every day | ORAL | 1 refills | Status: DC

## 2022-01-25 MED ORDER — PREDNISONE 20 MG PO TABS: 60.0000 mg | ORAL_TABLET | Freq: Every day | ORAL | 0 refills | Status: DC

## 2022-01-25 MED ORDER — AZITHROMYCIN 250 MG PO TABS: ORAL_TABLET | ORAL | 0 refills | Status: DC

## 2022-01-25 NOTE — Assessment & Plan Note (Signed)
I discussed with pulmonology Dr. Patsey Berthold. Differential diagnosis includes immunotherapy related, atypical infection, aspiration related. We agree on recommending patient to be started on steroids and azithromycin. Patient have a follow-up appointment with Dr. Patsey Berthold in August 2023.

## 2022-01-25 NOTE — Assessment & Plan Note (Addendum)
Continue Synthroid 88 mcg daily TSH fluctuates. Normal free T4. Repeat panel at next visit.

## 2022-01-25 NOTE — Addendum Note (Signed)
Addended by: [name] on: 01/25/2022 03:15 PM   Modules accepted: Orders

## 2022-01-25 NOTE — Assessment & Plan Note (Signed)
Recommend patient to take Calcium 1200mg  daily.

## 2022-01-25 NOTE — Addendum Note (Signed)
Addended by: [name] on: 01/25/2022 11:57 AM   Modules accepted: Orders

## 2022-01-25 NOTE — Assessment & Plan Note (Signed)
Continue Eliquis and Plavix.

## 2022-01-25 NOTE — Assessment & Plan Note (Addendum)
Labs reviewed and discussed with patient.  Reviewed and discussed with patient Hold off Durvalumab due to concern of pneumonitis or atypical infection

## 2022-01-25 NOTE — Assessment & Plan Note (Signed)
Follows up with pulmonology Dr.Gonzalez. He is on [employer]

## 2022-01-25 NOTE — Assessment & Plan Note (Signed)
February 2023 EGD showed duodenitis. Previously on Pepcid. Aspiration pneumonitis is on differential Recommend patient to switch to omeprazole

## 2022-01-25 NOTE — Assessment & Plan Note (Signed)
S/p SRS. Discussed with radiation oncology Dr. Baruch Gouty about the MRI images.   3 mm enhancing metastasis overall appears stable.  Size is too small to be treated. Short term follow up

## 2022-01-25 NOTE — Progress Notes (Addendum)
Hematology/Oncology Progress note [phone] [phone]      Patient Care Team: [name], MD as PCP - General (Physician Assistant) [name], MD as Referring Physician (Physician Assistant) Bary Castilla, [name], MD (General Surgery) Telford Nab, RN as Oncology Nurse Navigator   ASSESSMENT & PLAN:   Squamous cell carcinoma of right lung [facility]) Labs reviewed and discussed with patient.  Reviewed and discussed with patient Hold off Durvalumab due to concern of pneumonitis or atypical infection    Hypothyroidism due to medication Continue Synthroid 88 mcg daily TSH fluctuates. Normal free T4. Repeat panel at next visit.   PVD (peripheral vascular disease) (HCC) Continue Eliquis and Plavix.   GERD (gastroesophageal reflux disease) February 2023 EGD showed duodenitis. Previously on Pepcid. Aspiration pneumonitis is on differential Recommend patient to switch to omeprazole   Hypocalcemia Recommend patient to take Calcium 1289m daily.   COPD (chronic obstructive pulmonary disease) ([place] Follows up with pulmonology Dr.Gonzalez. He is on Stiolto  Malignant neoplasm metastatic to brain ([facility] S/p SRS. Discussed with radiation oncology Dr. CBaruch Goutyabout the MRI images.   3 mm enhancing metastasis overall appears stable.  Size is too small to be treated. Short term follow up  Pneumonitis I discussed with pulmonology Dr. GPatsey Berthold Differential diagnosis includes immunotherapy related, atypical infection, aspiration related. We agree on recommending patient to be started on steroids and azithromycin. Patient have a follow-up appointment with Dr. GPatsey Bertholdin August 2023.  No orders of the defined types were placed in this encounter.  Follow up in 2-3 weeks, lab MD. CXR prior to visit.  All questions were answered. The patient knows to call the clinic with any problems, questions or concerns.  Joshua Server MD, PhD [facility]  Hematology Oncology 01/24/2022      CHIEF COMPLAINTS/REASON FOR VISIT:  Follow up for lung cancer  HISTORY OF PRESENTING ILLNESS:  Joshua ORTNERis a 66y.o. male who has above history reviewed by me today presents for follow up visit for Stage IV lung squamous cell carcinoma. Oncology History  Squamous [place] right lung [place]  09/18/2020 Imaging   PET scan showed right upper lobe nodule maximal SUV 11.5.  Hypermetabolic cluster right paratracheal adenopathy with maximum SUV up to 12.  No findings of metastatic disease to the neck/abdomen/pelvis or skeleton.   09/20/2020 Initial Diagnosis   Squamous cell carcinoma of right lung (HCC) NGS showed PD-L1 TPS 95%, LRP1B S9939f PIK3CA E545K, RB1 c1422-2A>T, RIT1 T83_ A84del, STK11 [id number] R248W, TMB 19.5mu24mb, MS stable.    09/20/2020 Cancer Staging   Staging form: Lung, AJCC 8th Edition - Clinical stage from 09/20/2020: Stage IV (cT1b, cN2, cM1) - Signed by Yu, Earlie [name] on 12/13/2021   10/03/2020 Imaging   MRI brain with and without contrast showed no evidence of intracranial metastatic disease. Well-defined round lesion within the left parotid tail measuring approximately 2.3 x 1.8 x 1.8 cm   10/09/2020 - 11/27/2020 Radiation Therapy   concurrent chemoradiation   10/10/2020 - 10/31/2020 Chemotherapy   10/10/2020 concurrent chemoradiation.  carboplatin AUC of 2 and Taxol 45 mg/m2 x 4.  Additional chemotherapy was held due to thrombocytopenia, neutropenia       12/31/2020 -  Chemotherapy   Maintenance  Durvalumab q14d      03/28/2021 Imaging   CT without contrast showed new linear right perihilar consolidation with associated traction bronchiectasis.  Likely postradiation changes.  Additional new peribronchial vascular/nodular groundglass opacities with areas of consolidation or seen primarily  in the right upper and lower lobes, more distant from expected radiation fields. Findings are possibly due to radiation-induced  organizing pneumonia   03/28/2021 Adverse Reaction   Durvalumab was held for possible  pneumonitis.  Per my discussion with pulmonology Dr. 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     05/09/2021 Imaging   Repeat CT chest without contrast showed evolving radiation changes in the right perihilar region likely accounting for interval increase to thickening of the posterior wall of the right upper lobe bronchus.  The more peripheral right lung groundglass opacities seen on most recent study have generally improved with exception of one peripherally right upper lobe which appears new.  These are likely inflammatory.  No findings highly suspicious for local recurrence.   05/30/2021 -  Chemotherapy   Patient is on Treatment Plan :  LUNG Durvalumab q14d     10/07/2021 Imaging   MRI brain with and without contrast showed 2 contrast-enhancing lesions, most consistent with metastatic disease.  Largest lesion is in the left frontal lobe and measures up to 1.3 cm with a large amount of surrounding edema and 9 mm rightward midline shift.  The other enhancing lesion was 4 mm in the right occipital lobe with a small amount of surrounding edema.  No acute or chronic hemorrhage.   10/07/2021 Progression   Headache after colonoscopy, progressive difficulty using her right arm.  Progressively worsening of speech. Stage IV lung cancer with brain metastasis.  started on Dexamethasone.  He was seen by neurosurgery and radiation oncology.  Recommendation is to proceed with SRS.  Patient was discharged with dexamethasone   10/16/2021 Imaging   PET scan showed postradiation changes in the right supra hilar lung without evidence of local recurrence.  No evidence of lung cancer recurrence or metastatic disease.  Single focus of intense metabolic activity associated with posterior left 11th rib likely secondary to recent fall/posttraumatic metabolic changes.  Frontal  uptake in the left frontal lobe  corresponding to the known metastatic lesion in the brain   10/29/2021 -  Radiation Therapy   Brain Radiation [facility]   12/10/2021 Imaging   Brain MRI: Continued interval decrease in size of a left frontal lobe metastasis, now measuring 10 mm. Moderate surrounding edema has also decreased.  3-4 mm metastasis within the right occipital lobe, not significantly changed in size. There is now a small focus of central non enhancement within this lesion. Increased edema. 3 mm enhancing metastasis within the high left parietal lobe. In retrospect, this was likely present as a subtle punctate focus of enhancement on the prior brain MRI of 10/22/2021.   01/18/2022 Imaging   CT chest abdomen pelvis w contrast 1. Stable post treatment changes in the right upper lobe medially with dense radiation fibrosis but no findings to suggest local recurrent tumor. 2. Significant progression of ill-defined ground-glass opacity, interstitial thickening and airspace nodularity in the right upper lobe and upper aspect of the right lower lobe. Findings could reflect an inflammatory or atypical infectious process, drug induced pneumonitis or interstitial spread of tumor.3. No mediastinal or hilar mass or adenopathy. 4. No findings for abdominal/pelvic metastatic disease or osseous metastatic disease.    Today he feels well. No new complaints. Chronic SOB, slightly worse. No cough. Intermittent headache, not worse. No focal deficit.     Review of Systems  Constitutional:  Positive for fatigue. Negative for appetite change, chills, fever and unexpected weight change.  HENT:   Negative for hearing loss and  voice change.   Eyes:  Negative for eye problems and icterus.  Respiratory:  Negative for chest tightness, cough and shortness of breath.   Cardiovascular:  Negative for chest pain and leg swelling.  Gastrointestinal:  Negative for abdominal distention and abdominal pain.  Endocrine: Negative for hot flashes.   Genitourinary:  Negative for difficulty urinating, dysuria and frequency.   Musculoskeletal:  Negative for arthralgias.  Skin:  Negative for itching and rash.  Neurological:  Positive for headaches. Negative for light-headedness and numbness.  Hematological:  Negative for adenopathy. Does not bruise/bleed easily.  Psychiatric/Behavioral:  Negative for confusion.       MEDICAL HISTORY:  Past Medical History:  Diagnosis Date   Allergy    Cancer ([place])    lung   Dyspnea    former smoker. only doe   History of kidney stones    Hypertension    Peripheral vascular disease ([place])    Pre-diabetes    Sleep apnea     SURGICAL HISTORY: Past Surgical History:  Procedure Laterality Date   ANTERIOR CERVICAL DECOMP/DISCECTOMY FUSION N/A 03/14/2020   Procedure: ANTERIOR CERVICAL DECOMPRESSION/DISCECTOMY FUSION 3 LEVELS C4-7;  Surgeon: Meade Maw, MD;  Location: [facility] ORS;  Service: Neurosurgery;  Laterality: N/A;   BACK SURGERY  2011   CENTRAL LINE INSERTION Right 09/10/2016   Procedure: CENTRAL LINE INSERTION;  Surgeon: Katha Cabal, MD;  Location: [facility] ORS;  Service: Vascular;  Laterality: Right;   CERVICAL FUSION     C4-c7 on Mar 14, 2020   COLONOSCOPY  2013 ?   COLONOSCOPY N/A 09/09/2021   Procedure: COLONOSCOPY;  Surgeon: [name], DO;  Location: [facility] ENDOSCOPY;  Service: Gastroenterology;  Laterality: N/A;   CORONARY ANGIOPLASTY     ESOPHAGOGASTRODUODENOSCOPY N/A 09/09/2021   Procedure: ESOPHAGOGASTRODUODENOSCOPY (EGD);  Surgeon: [name], DO;  Location: [facility] ENDOSCOPY;  Service: Gastroenterology;  Laterality: N/A;   FEMORAL-POPLITEAL BYPASS GRAFT Right 09/10/2016   Procedure: BYPASS GRAFT FEMORAL-POPLITEAL ARTERY ( BELOW KNEE );  Surgeon: Katha Cabal, MD;  Location: [facility] ORS;  Service: Vascular;  Laterality: Right;   FEMORAL-TIBIAL BYPASS GRAFT Right 03/23/2020   Procedure: BYPASS GRAFT RIGHT FEMORAL- DISTAL TIBIAL ARTERY WITH DISTAL FLOW  GRAFT;  Surgeon: Katha Cabal, MD;  Location: [facility] ORS;  Service: Vascular;  Laterality: Right;   LEFT HEART CATH AND CORONARY ANGIOGRAPHY Left 05/16/2021   Procedure: LEFT HEART CATH AND CORONARY ANGIOGRAPHY;  Surgeon: Andrez Grime, MD;  Location: [place] CV LAB;  Service: Cardiovascular;  Laterality: Left;   LOWER EXTREMITY ANGIOGRAPHY Right 11/18/2016   Procedure: Lower Extremity Angiography;  Surgeon: Katha Cabal, MD;  Location: [place] CV LAB;  Service: Cardiovascular;  Laterality: Right;   LOWER EXTREMITY ANGIOGRAPHY Right 12/09/2016   Procedure: Lower Extremity Angiography;  Surgeon: Katha Cabal, MD;  Location: [place] [facility] LAB;  Service: Cardiovascular;  Laterality: Right;   LOWER EXTREMITY ANGIOGRAPHY Right 11/15/2019   Procedure: LOWER EXTREMITY ANGIOGRAPHY;  Surgeon: Katha Cabal, MD;  Location: [place] CV LAB;  Service: Cardiovascular;  Laterality: Right;   LOWER EXTREMITY ANGIOGRAPHY Right 12/01/2019   Procedure: Lower Extremity Angiography;  Surgeon: Algernon Huxley, MD;  Location: [place] CV LAB;  Service: Cardiovascular;  Laterality: Right;   LOWER EXTREMITY ANGIOGRAPHY Right 12/02/2019   Procedure: LOWER EXTREMITY ANGIOGRAPHY;  Surgeon: Katha Cabal, MD;  Location: [place] [facility] LAB;  Service: Cardiovascular;  Laterality: Right;   LOWER EXTREMITY ANGIOGRAPHY Right 03/23/2020   Procedure: Lower Extremity  Angiography;  Surgeon: Katha Cabal, MD;  Location: [place] CV LAB;  Service: Cardiovascular;  Laterality: Right;   LOWER EXTREMITY ANGIOGRAPHY Right 09/25/2021   Procedure: Lower Extremity Angiography;  Surgeon: Katha Cabal, MD;  Location: [place] CV LAB;  Service: Cardiovascular;  Laterality: Right;   LOWER EXTREMITY ANGIOGRAPHY Right 09/26/2021   Procedure: Lower Extremity Angiography;  Surgeon: Algernon Huxley, MD;  Location: [place] [facility] LAB;  Service: Cardiovascular;  Laterality: Right;   LOWER  EXTREMITY INTERVENTION  11/18/2016   Procedure: Lower Extremity Intervention;  Surgeon: Katha Cabal, MD;  Location: [place] [facility] LAB;  Service: Cardiovascular;;   PERIPHERAL VASCULAR CATHETERIZATION Right 01/02/2015   Procedure: Lower Extremity Angiography;  Surgeon: Katha Cabal, MD;  Location: [place] [facility] LAB;  Service: Cardiovascular;  Laterality: Right;   PERIPHERAL VASCULAR CATHETERIZATION Right 06/18/2015   Procedure: Lower Extremity Angiography;  Surgeon: Algernon Huxley, MD;  Location: [place] [facility] LAB;  Service: Cardiovascular;  Laterality: Right;   PERIPHERAL VASCULAR CATHETERIZATION  06/18/2015   Procedure: Lower Extremity Intervention;  Surgeon: Algernon Huxley, MD;  Location: [place] [facility] LAB;  Service: Cardiovascular;;   PERIPHERAL VASCULAR CATHETERIZATION N/A 10/09/2015   Procedure: Abdominal Aortogram w/Lower Extremity;  Surgeon: Katha Cabal, MD;  Location: [place] [facility] LAB;  Service: Cardiovascular;  Laterality: N/A;   PERIPHERAL VASCULAR CATHETERIZATION  10/09/2015   Procedure: Lower Extremity Intervention;  Surgeon: Katha Cabal, MD;  Location: [place] [facility] LAB;  Service: Cardiovascular;;   PERIPHERAL VASCULAR CATHETERIZATION Right 10/10/2015   Procedure: Lower Extremity Angiography;  Surgeon: Algernon Huxley, MD;  Location: [place] [facility] LAB;  Service: Cardiovascular;  Laterality: Right;   PERIPHERAL VASCULAR CATHETERIZATION Right 10/10/2015   Procedure: Lower Extremity Intervention;  Surgeon: Algernon Huxley, MD;  Location: [place] [facility] LAB;  Service: Cardiovascular;  Laterality: Right;   PERIPHERAL VASCULAR CATHETERIZATION Right 12/03/2015   Procedure: Lower Extremity Angiography;  Surgeon: Algernon Huxley, MD;  Location: [place] [facility];  Service: Cardiovascular;  Laterality: Right;   PERIPHERAL VASCULAR CATHETERIZATION Right 12/04/2015   Procedure: Lower Extremity Angiography;  Surgeon: Algernon Huxley, MD;  Location: [place] [facility] LAB;   Service: Cardiovascular;  Laterality: Right;   PERIPHERAL VASCULAR CATHETERIZATION  12/04/2015   Procedure: Lower Extremity Intervention;  Surgeon: Algernon Huxley, MD;  Location: [place] [facility] LAB;  Service: Cardiovascular;;   PERIPHERAL VASCULAR CATHETERIZATION Right 06/30/2016   Procedure: Lower Extremity Angiography;  Surgeon: Algernon Huxley, MD;  Location: [place] [facility] LAB;  Service: Cardiovascular;  Laterality: Right;   PERIPHERAL VASCULAR CATHETERIZATION Right 06/25/2016   Procedure: Lower Extremity Angiography;  Surgeon: Katha Cabal, MD;  Location: [place] [facility] LAB;  Service: Cardiovascular;  Laterality: Right;   PERIPHERAL VASCULAR CATHETERIZATION Right 07/01/2016   Procedure: Lower Extremity Angiography;  Surgeon: Katha Cabal, MD;  Location: [place] [facility];  Service: Cardiovascular;  Laterality: Right;   PERIPHERAL VASCULAR CATHETERIZATION  07/01/2016   Procedure: Lower Extremity Intervention;  Surgeon: Katha Cabal, MD;  Location: [place] [facility] LAB;  Service: Cardiovascular;;   PERIPHERAL VASCULAR CATHETERIZATION Right 08/01/2016   Procedure: Lower Extremity Angiography;  Surgeon: Katha Cabal, MD;  Location: [place] [facility] LAB;  Service: Cardiovascular;  Laterality: Right;   PORTA CATH INSERTION N/A 10/05/2020   Procedure: PORTA CATH INSERTION;  Surgeon: Katha Cabal, MD;  Location: [place] [facility];  Service: Cardiovascular;  Laterality: N/A;   stent placement  in right leg Right    VIDEO BRONCHOSCOPY N/A 09/14/2020   Procedure: VIDEO BRONCHOSCOPY WITH FLUORO;  Surgeon: Tyler Pita, MD;  Location: [facility] ORS;  Service: Cardiopulmonary;  Laterality: N/A;   VIDEO BRONCHOSCOPY WITH ENDOBRONCHIAL ULTRASOUND N/A 09/14/2020   Procedure: VIDEO BRONCHOSCOPY WITH ENDOBRONCHIAL ULTRASOUND;  Surgeon: Tyler Pita, MD;  Location: [facility] ORS;  Service: Cardiopulmonary;  Laterality: N/A;    SOCIAL HISTORY: Social History   Socioeconomic History    Marital status: Single    Spouse name: Not on file   Number of children: Not on file   Years of education: Not on file   Highest education level: Not on file  Occupational History   Occupation: unemployed  Tobacco Use   Smoking status: Former    Packs/day: 2.00    Years: 38.00    Total pack years: 76.00    Types: Cigarettes    Quit date: 12/20/2014    Years since quitting: 7.1   Smokeless tobacco: Never   Tobacco comments:    quit smoking in 2017 most smoked 2   Vaping Use   Vaping Use: Former  Substance and Sexual Activity   Alcohol use: Not Currently   Drug use: No   Sexual activity: Yes  Other Topics Concern   Not on file  Social History Narrative   Not on file   Social Determinants of Health   Financial Resource Strain: Not on file  Food Insecurity: Not on file  Transportation Needs: Not on file  Physical Activity: Not on file  Stress: Not on file  Social Connections: Not on file  Intimate Partner Violence: Not on file    FAMILY HISTORY: Family History  Problem Relation Age of Onset   Diabetes Mother    Hypertension Mother    Heart murmur Mother    Leukemia Mother    Throat cancer Maternal Grandmother    Colon cancer Neg Hx    Breast cancer Neg Hx     ALLERGIES:  has no active allergies.  MEDICATIONS:  Current Outpatient Medications  Medication Sig Dispense Refill   acetaminophen (TYLENOL) 325 MG tablet Take 650 mg by mouth every 6 (six) hours as needed for moderate pain.     calcium-vitamin D (OSCAL WITH D) 500-5 MG-MCG tablet Take 2 tablets by mouth daily. 60 tablet 1   clopidogrel (PLAVIX) 75 MG tablet TAKE 1 TABLET BY MOUTH  DAILY 100 tablet 2   ELIQUIS 5 MG TABS tablet TAKE 1 TABLET BY MOUTH  TWICE DAILY 120 tablet 5   famotidine (PEPCID) 20 MG tablet Take 1 tablet (20 mg total) by mouth daily. Take 1 tablet at bedtime the night before chemo and 1 tablet on the night of chemotherapy. 90 tablet 2   levETIRAcetam (KEPPRA) 500 MG tablet Take 1  tablet (500 mg total) by mouth 2 (two) times daily. 180 tablet 3   levothyroxine (SYNTHROID) 88 MCG tablet Take 1 tablet (88 mcg total) by mouth daily before breakfast. 30 tablet 2   simvastatin (ZOCOR) 40 MG tablet Take 40 mg by mouth at bedtime.     Tiotropium Bromide-Olodaterol (STIOLTO RESPIMAT) 2.5-2.5 MCG/ACT AERS Inhale 2 puffs into the lungs daily. 4 g 0   No current facility-administered medications for this visit.     PHYSICAL EXAMINATION: ECOG PERFORMANCE STATUS: 1 - Symptomatic but completely ambulatory Vitals:   01/24/22 0915  BP: 135/83  Pulse: (!) 103  Temp: 98.1 F (36.7 C)   Filed Weights   01/24/22 0915  Weight: 204 lb (92.5 kg)    Physical Exam Constitutional:      General: He is not in acute distress.    Appearance: He is not diaphoretic.     Comments: Patient walks with a cane  HENT:     Head: Normocephalic and atraumatic.     Nose: Nose normal.     Mouth/Throat:     Pharynx: No oropharyngeal exudate.  Eyes:     General: No scleral icterus.    Pupils: Pupils are equal, round, and reactive to light.  Cardiovascular:     Rate and Rhythm: Normal rate and regular rhythm.     Heart sounds: No murmur heard. Pulmonary:     Effort: Pulmonary effort is normal. No respiratory distress.     Breath sounds: No rales.  Chest:     Chest wall: No tenderness.  Abdominal:     General: There is no distension.     Palpations: Abdomen is soft.     Tenderness: There is no abdominal tenderness.  Musculoskeletal:        General: Normal range of motion.     Cervical back: Normal range of motion and neck supple.  Skin:    General: Skin is warm and dry.     Findings: No erythema.  Neurological:     Mental Status: He is alert and oriented to person, place, and time.     Cranial Nerves: No cranial nerve deficit.     Motor: No abnormal muscle tone.     Coordination: Coordination normal.  Psychiatric:        Mood and Affect: Affect normal.      LABORATORY DATA:   I have reviewed the data as listed Lab Results  Component Value Date   WBC 4.4 01/24/2022   HGB 12.0 (L) 01/24/2022   HCT 37.3 (L) 01/24/2022   MCV 90.1 01/24/2022   PLT 285 01/24/2022   Recent Labs    12/27/21 0825 01/10/22 0840 01/24/22 0902  NA 140 136 140  K 4.0 3.7 3.7  CL 107 103 105  CO2 _0 GLUCOSE 107* 114* 168*  BUN 34* 20 22  CREATININE 0.97 1.12 1.20  CALCIUM 8.4* 8.6* 9.2  GFRNONAA >60 >60 >60  PROT 6.8 7.2 7.1  ALBUMIN 3.7 3.5 3.7  AST _1 ALT _2 ALKPHOS 52 53 62  BILITOT 0.5 0.5 0.6    Iron/TIBC/Ferritin/ %Sat    Component Value Date/Time   IRON 48 08/30/2020 1052   TIBC 391 08/30/2020 1052   FERRITIN 25 08/30/2020 1052   IRONPCTSAT 12 (L) 08/30/2020 1052      RADIOGRAPHIC STUDIES: I have personally reviewed the radiological images as listed and agreed with the findings in the report. CT CHEST ABDOMEN PELVIS W CONTRAST  Result Date: 01/18/2022 CLINICAL DATA:  Restaging metastatic lung cancer. EXAM: CT CHEST, ABDOMEN, AND PELVIS WITH CONTRAST TECHNIQUE: Multidetector CT imaging of the chest, abdomen and pelvis was performed following the standard protocol during bolus administration of intravenous contrast. RADIATION DOSE REDUCTION: This exam was performed according to the departmental dose-optimization program which includes automated exposure control, adjustment of the mA and/or kV according to patient size and/or use of iterative reconstruction technique. CONTRAST:  169m OMNIPAQUE IOHEXOL 300 MG/ML  SOLN COMPARISON:  PET-CT 10/16/2021 and prior chest CT 08/22/2021 FINDINGS: CT CHEST FINDINGS Cardiovascular: The heart is normal in size. No pericardial effusion. The aorta is normal in caliber. No dissection. The branch vessels  are patent. Stable right IJ power port. Scattered coronary artery calcifications are stable. Mediastinum/Nodes: No mediastinal or hilar mass or lymphadenopathy. The esophagus is grossly normal. Lungs/Pleura:  Stable post treatment changes in the right upper lobe medially with probable radiation fibrosis. I do not see any progressive soft tissue thickening to suggest recurrent tumor. However, there is significant progression of ill-defined ground-glass opacity, interstitial thickening and airspace nodularity in the right upper lobe and also in the upper aspect of the right lower lobe. Findings could reflect an inflammatory or infectious process but interstitial spread of tumor is also possible. Drug induced pneumonitis in combination with radiation pneumonitis would be another possibility. The left lung remains relatively clear. No pleural effusions or pleural lesions. Musculoskeletal: No chest wall mass, supraclavicular or axillary adenopathy. The bony thorax is intact. CT ABDOMEN PELVIS FINDINGS Hepatobiliary: No hepatic lesions to suggest metastatic disease. No intrahepatic biliary dilatation. The gallbladder is unremarkable. No common bile duct dilatation. Pancreas: No mass, inflammation or ductal dilatation. Spleen: Normal size. No focal lesions. Adrenals/Urinary Tract: Adrenal glands and kidneys are unremarkable. The bladder is normal. Stomach/Bowel: The stomach, duodenum, small bowel and colon are unremarkable. No acute inflammatory process, mass lesion or obstructive finding. The terminal ileum and appendix are normal. There is a metallic clip in the cecum possibly related to a prior colonoscopy and biopsy. Stable significant age advanced sigmoid colon diverticulosis. Vascular/Lymphatic: Scattered atherosclerotic calcifications involving the aorta and iliac arteries. Right femoral artery stents are noted. No mesenteric or retroperitoneal mass or adenopathy. Reproductive: The prostate gland and seminal vesicles are unremarkable. Other: No pelvic mass or adenopathy. No free pelvic fluid collections. No inguinal mass or adenopathy. No abdominal wall hernia or subcutaneous lesions. Musculoskeletal: No significant bony  findings. No lytic or sclerotic bone lesions to suggest metastatic disease. Stable lumbar fusion hardware at L5-S1. IMPRESSION: 1. Stable post treatment changes in the right upper lobe medially with dense radiation fibrosis but no findings to suggest local recurrent tumor. 2. Significant progression of ill-defined ground-glass opacity, interstitial thickening and airspace nodularity in the right upper lobe and upper aspect of the right lower lobe. Findings could reflect an inflammatory or atypical infectious process, drug induced pneumonitis or interstitial spread of tumor. 3. No mediastinal or hilar mass or adenopathy. 4. No findings for abdominal/pelvic metastatic disease or osseous metastatic disease. * Tracking Code: BO * Aortic Atherosclerosis (ICD10-I70.0). Electronically Signed   By: Marijo Sanes M.D.   On: 01/18/2022 14:56   VAS [place] LOWER EXTREMITY ARTERIAL DUPLEX  Result Date: 12/18/2021 LOWER EXTREMITY ARTERIAL DUPLEX STUDY Patient Name:  COLYN MIRON  Date of Exam:   12/11/2021 Medical Rec #: [id number]            Accession #:    [id number] Date of Birth: [birth year]           Patient Gender: M Patient Age:   40 years Exam Location:  [facility] Procedure:      VAS [place] LOWER EXTREMITY ARTERIAL DUPLEX Referring Phys: Eulogio Ditch --------------------------------------------------------------------------------  Indications: Peripheral artery disease, and pain post workout. Other Factors: ASO s/p multiple interventions and bypass.                11/15/2019 Right thrombectomy of Fempop insitu graft with added                stent throughout.                 12/01/2019: Aortogram and Selctive Right Lower  Extremity                Angiogram. 6 mg of TPA delivered in a lysis catheterfrom the                Origin of the Right Femoral -Popliteal bypass down the proximal                ATA with placement of the lysis.                 12/02/2019: PTA of the Right Posterior Tibial Artery. PTA of  the                Right Peroneal Artery. Mechanical Thrombectomy of the Right                Femoral -Popliteal Bypass graft. Mechanical Thrombectomy of the                Right Tibioperoneal Trunk and Posterior Tibial Artery.                 09/25/2021: Right Lower Extremity Angiography third order                catheter placement. PTA of the proximal peroneal and                tibioperoneal trunk to 2.5 mm with an ultra score balloon. PTA of                the Femoral Artery and the proximal anstomosis of the bypass                graft to 4 mm with an ultra score balloon. Administration of 5 mg                of tpa throughout the entire bypass and in the peroneal Artery.                Initiation of continuous tpa infusion Right lower Extremity for                limb salvage. Insertion of a triple lumen catheter Left CFV with                [place] and fluoroscopic guidance.                 09/26/2021: Right LE Angiogram. PTA of the Right Posterior Tibial                Artery with 2.5 mm diameter by 30 cm length angioplasty balloon.                PTA of the distal bypass anastomosis and distal bypass graft                analogous to the Popliteal artery with 4 mm diameter by 8 cm                length Lutonix drug coated angioplasty balloon.  Vascular Interventions: Hx of multiple right lower extremity PTA's/stents.                         09/10/2016 Right femoral to below the knee popliteal                         artery bypass graft placement. 5/01 Right bypass graft &  mid popliteal artery PTA & coil embolectomy of large                         branch of bypass graft. 12/09/16 Right distal bypass                         graft PTA/stent and right posterior tibial artery PTA.                         03/23/2020 Rt PTA thrombectomy. Rt Profunda fem artery to                         PTA bypass. Current ABI:            n/a Performing Technologist: Concha Norway RVT  Examination Guidelines: A  complete evaluation includes B-mode imaging, spectral Doppler, color Doppler, and power Doppler as needed of all accessible portions of each vessel. Bilateral testing is considered an integral part of a complete examination. Limited examinations for reoccurring indications may be performed as noted.  +-----------+--------+-----+--------+----------+--------+ RIGHT      PSV cm/sRatioStenosisWaveform  Comments +-----------+--------+-----+--------+----------+--------+ CFA Mid    57                                      +-----------+--------+-----+--------+----------+--------+ DFA        40                                      +-----------+--------+-----+--------+----------+--------+ ATA Distal 105                  monophasic         +-----------+--------+-----+--------+----------+--------+ PTA Distal 11                   biphasic           +-----------+--------+-----+--------+----------+--------+ PERO Distal11                   monophasic         +-----------+--------+-----+--------+----------+--------+  Right Graft #1: fempta +------------------+--------+--------+--------+--------+                   PSV cm/sStenosisWaveformComments +------------------+--------+--------+--------+--------+ Inflow            57                               +------------------+--------+--------+--------+--------+ Prox Anastomosis  29                               +------------------+--------+--------+--------+--------+ Proximal Graft    37                               +------------------+--------+--------+--------+--------+ Mid Graft         30                               +------------------+--------+--------+--------+--------+ Distal Graft      27                               +------------------+--------+--------+--------+--------+  Distal Anastomosis37                               +------------------+--------+--------+--------+--------+ Outflow            50                               +------------------+--------+--------+--------+--------+   Summary: Right: Patent BPG with no evidence of stenosis.  See table(s) above for measurements and observations. Electronically signed by Leotis Pain MD on 12/18/2021 at 9:15:55 AM.    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 EXAM: MRI HEAD WITHOUT AND WITH CONTRAST TECHNIQUE: Multiplanar, multiecho pulse sequences of the brain and surrounding structures were obtained without and with intravenous contrast. CONTRAST:  49m GADAVIST GADOBUTROL 1 MMOL/ML IV SOLN COMPARISON:  Prior brain MRI examinations 10/22/2021 and earlier. FINDINGS: Brain: No age advanced or lobar predominant parenchymal atrophy. Continued interval decrease in size of an enhancing metastasis the subcortical white matter of the posterolateral left frontal lobe, now measuring 10 mm (previously measuring 12 mm) (series 18, image 107). Moderate surrounding vasogenic edema has also decreased. A 3-4 mm enhancing lesion within the right occipital lobe has not significantly changed in size. There is a central focus of non enhancement within this lesion, which is new from the prior MRI of 10/22/2021. Mild surrounding edema has slightly increased. 3 mm enhancing metastasis within the high left parietal lobe (series 18, image 146). In retrospect, this was likely present as a subtle punctate focus of enhancement on the prior brain MRI of 10/22/2021. Unchanged punctate enhancing focus within the left cerebellar hemisphere, favored to reflect vascular enhancement (series 18, image 35). Background mild-to-moderate chronic small vessel ischemic changes within the cerebral white matter, stable. Redemonstrated tiny T2 hyperintense focus within the right cerebellar hemisphere, which may reflect a chronic lacunar infarct (series 10, image 7). There is no acute  infarct No chronic intracranial blood products. No extra-axial fluid collection. No midline shift. Vascular: Maintained flow voids within the proximal large arterial vessels. Skull and upper cervical spine: No focal suspicious marrow lesion. Sinuses/Orbits: No mass or acute finding within the imaged orbits. Mild mucosal thickening within the bilateral ethmoid and maxillary sinuses. Superimposed small mucous retention cyst within the right maxillary sinus. Other: Trace fluid within the left mastoid air cells. Known left parotid mass, incompletely imaged. Impression #3 will be called to the ordering clinician or representative by the Radiologist Assistant, and communication documented in the PACS or [employer] IMPRESSION: Since the prior brain MRI of 10/22/2021, there has been a continued interval decrease in size of a left frontal lobe metastasis, now measuring 10 mm. Moderate surrounding edema has also decreased. 3-4 mm metastasis within the right occipital lobe, not significantly changed in size. There is now a small focus of central non enhancement within this lesion. Surrounding edema has slightly increased, possibly treatment-related. Attention recommended on imaging follow-up. 3 mm enhancing metastasis within the high left parietal lobe. In retrospect, this was likely present as a subtle punctate focus of enhancement on the prior brain MRI of 10/22/2021. Unchanged punctate focus of enhancement within the left cerebellar hemisphere, which is favored vascular. Attention recommended on follow-up. 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 Differential considerations are unchanged - primary parotid neoplasm  versus metastasis. Electronically Signed   By: Kellie Simmering D.O.   On: 12/10/2021 14:51

## 2022-01-27 ENCOUNTER — Telehealth: Payer: Self-pay

## 2022-01-27 NOTE — Telephone Encounter (Signed)
-----   Message from [name], MD sent at 01/25/2022  3:08 PM EDT ----- Sorry I forget to mention that I also recommend he do additional labs. Please add lab encounter, asap.  Labs are ordered. Thanks.   ----- Message ----- From: [name], MD Sent: 01/25/2022  11:56 AM EDT To: Evelina Dun, RN; Stephens November; #  Team, I sent him a my chart message.please follow up with him to see if he gets the message, any questions, and also please schedule him for a follow up lab MD visit during week of 7/24.

## 2022-01-27 NOTE — Telephone Encounter (Signed)
Pt has read/seen Mychart message.   Please call pt to set up lab encounter asap and schedule him for Lab/MD during the week of 7/24.

## 2022-01-28 ENCOUNTER — Inpatient Hospital Stay: Payer: Medicare Other

## 2022-01-28 DIAGNOSIS — J189 Pneumonia, unspecified organism: Secondary | ICD-10-CM

## 2022-01-28 DIAGNOSIS — C3411 Malignant neoplasm of upper lobe, right bronchus or lung: Secondary | ICD-10-CM | POA: Diagnosis not present

## 2022-01-28 DIAGNOSIS — C3491 Malignant neoplasm of unspecified part of right bronchus or lung: Secondary | ICD-10-CM

## 2022-01-28 LAB — COMPREHENSIVE METABOLIC PANEL
ALT: 18 U/L (ref 0–44)
AST: 23 U/L (ref 15–41)
Albumin: 3.8 g/dL (ref 3.5–5.0)
Alkaline Phosphatase: 64 U/L (ref 38–126)
Anion gap: 9 (ref 5–15)
BUN: 21 mg/dL (ref 8–23)
CO2: 26 mmol/L (ref 22–32)
Calcium: 9.1 mg/dL (ref 8.9–10.3)
Chloride: 106 mmol/L (ref 98–111)
Creatinine, Ser: 1.16 mg/dL (ref 0.61–1.24)
GFR, Estimated: >60 mL/min (ref >60)
Glucose, Bld: 142 mg/dL — ABNORMAL HIGH (ref 70–99)
Potassium: 4 mmol/L (ref 3.5–5.1)
Sodium: 141 mmol/L (ref 135–145)
Total Bilirubin: 0.5 mg/dL (ref 0.3–1.2)
Total Protein: 7.1 g/dL (ref 6.5–8.1)

## 2022-01-28 LAB — CBC WITH DIFFERENTIAL/PLATELET
Abs Immature Granulocytes: 0.09 10*3/uL — ABNORMAL HIGH (ref 0.00–0.07)
Basophils Absolute: 0.1 10*3/uL (ref 0.0–0.1)
Basophils Relative: 0 %
Eosinophils Absolute: 0 10*3/uL (ref 0.0–0.5)
Eosinophils Relative: 0 %
HCT: 36.9 % — ABNORMAL LOW (ref 39.0–52.0)
Hemoglobin: 11.8 g/dL — ABNORMAL LOW (ref 13.0–17.0)
Immature Granulocytes: 1 %
Lymphocytes Relative: 8 %
Lymphs Abs: 1 10*3/uL (ref 0.7–4.0)
MCH: 28.6 pg (ref 26.0–34.0)
MCHC: 32 g/dL (ref 30.0–36.0)
MCV: 89.6 fL (ref 80.0–100.0)
Monocytes Absolute: 1 10*3/uL (ref 0.1–1.0)
Monocytes Relative: 7 %
Neutro Abs: 11.1 10*3/uL — ABNORMAL HIGH (ref 1.7–7.7)
Neutrophils Relative %: 84 %
Platelets: 274 10*3/uL (ref 150–400)
RBC: 4.12 MIL/uL — ABNORMAL LOW (ref 4.22–5.81)
RDW: 14.6 % (ref 11.5–15.5)
WBC: 13.2 10*3/uL — ABNORMAL HIGH (ref 4.0–10.5)
nRBC: 0 % (ref 0.0–0.2)

## 2022-01-28 LAB — C-REACTIVE PROTEIN: CRP: 0.8 mg/dL (ref <1.0)

## 2022-01-28 LAB — PROCALCITONIN: Procalcitonin: <0.1 ng/mL

## 2022-01-28 LAB — SEDIMENTATION RATE: Sed Rate: 36 mm/hr — ABNORMAL HIGH (ref 0–16)

## 2022-01-30 ENCOUNTER — Encounter: Payer: Self-pay | Admitting: Oncology

## 2022-02-10 ENCOUNTER — Other Ambulatory Visit: Payer: Self-pay

## 2022-02-10 ENCOUNTER — Ambulatory Visit
Admission: RE | Admit: 2022-02-10 | Discharge: 2022-02-10 | Disposition: A | Discharge status: Home / Self Care | Payer: Medicare Other | Attending: Oncology | Admitting: Oncology

## 2022-02-10 ENCOUNTER — Ambulatory Visit
Admission: RE | Admit: 2022-02-10 | Discharge: 2022-02-10 | Disposition: A | Discharge status: Home / Self Care | Payer: Medicare Other | Source: Ambulatory Visit | Attending: Oncology | Admitting: Oncology

## 2022-02-10 DIAGNOSIS — C3491 Malignant neoplasm of unspecified part of right bronchus or lung: Secondary | ICD-10-CM

## 2022-02-10 DIAGNOSIS — J189 Pneumonia, unspecified organism: Secondary | ICD-10-CM

## 2022-02-12 ENCOUNTER — Inpatient Hospital Stay: Payer: Medicare Other

## 2022-02-12 ENCOUNTER — Inpatient Hospital Stay (HOSPITAL_BASED_OUTPATIENT_CLINIC_OR_DEPARTMENT_OTHER): Payer: Medicare Other | Admitting: Oncology

## 2022-02-12 ENCOUNTER — Encounter: Payer: Self-pay | Admitting: Oncology

## 2022-02-12 VITALS — BP 148/77 | HR 86 | Temp 97.5°F | Resp 18 | Wt 209.8 lb

## 2022-02-12 DIAGNOSIS — C3491 Malignant neoplasm of unspecified part of right bronchus or lung: Secondary | ICD-10-CM | POA: Diagnosis not present

## 2022-02-12 DIAGNOSIS — E032 Hypothyroidism due to medicaments and other exogenous substances: Secondary | ICD-10-CM

## 2022-02-12 DIAGNOSIS — C7931 Secondary malignant neoplasm of brain: Secondary | ICD-10-CM

## 2022-02-12 DIAGNOSIS — I739 Peripheral vascular disease, unspecified: Secondary | ICD-10-CM

## 2022-02-12 DIAGNOSIS — C3411 Malignant neoplasm of upper lobe, right bronchus or lung: Secondary | ICD-10-CM | POA: Diagnosis not present

## 2022-02-12 LAB — TSH: TSH: 7.325 u[IU]/mL — ABNORMAL HIGH (ref 0.350–4.500)

## 2022-02-12 LAB — CBC WITH DIFFERENTIAL/PLATELET
Abs Immature Granulocytes: 0.11 10*3/uL — ABNORMAL HIGH (ref 0.00–0.07)
Basophils Absolute: 0 10*3/uL (ref 0.0–0.1)
Basophils Relative: 0 %
Eosinophils Absolute: 0 10*3/uL (ref 0.0–0.5)
Eosinophils Relative: 0 %
HCT: 40.5 % (ref 39.0–52.0)
Hemoglobin: 13.1 g/dL (ref 13.0–17.0)
Immature Granulocytes: 1 %
Lymphocytes Relative: 7 %
Lymphs Abs: 0.8 10*3/uL (ref 0.7–4.0)
MCH: 29 pg (ref 26.0–34.0)
MCHC: 32.3 g/dL (ref 30.0–36.0)
MCV: 89.6 fL (ref 80.0–100.0)
Monocytes Absolute: 0.7 10*3/uL (ref 0.1–1.0)
Monocytes Relative: 5 %
Neutro Abs: 11.3 10*3/uL — ABNORMAL HIGH (ref 1.7–7.7)
Neutrophils Relative %: 87 %
Platelets: 203 10*3/uL (ref 150–400)
RBC: 4.52 MIL/uL (ref 4.22–5.81)
RDW: 14.6 % (ref 11.5–15.5)
WBC: 12.9 10*3/uL — ABNORMAL HIGH (ref 4.0–10.5)
nRBC: 0 % (ref 0.0–0.2)

## 2022-02-12 LAB — COMPREHENSIVE METABOLIC PANEL
ALT: 34 U/L (ref 0–44)
AST: 26 U/L (ref 15–41)
Albumin: 4 g/dL (ref 3.5–5.0)
Alkaline Phosphatase: 53 U/L (ref 38–126)
Anion gap: 7 (ref 5–15)
BUN: 28 mg/dL — ABNORMAL HIGH (ref 8–23)
CO2: 28 mmol/L (ref 22–32)
Calcium: 9.4 mg/dL (ref 8.9–10.3)
Chloride: 103 mmol/L (ref 98–111)
Creatinine, Ser: 1.04 mg/dL (ref 0.61–1.24)
GFR, Estimated: >60 mL/min (ref >60)
Glucose, Bld: 118 mg/dL — ABNORMAL HIGH (ref 70–99)
Potassium: 4.2 mmol/L (ref 3.5–5.1)
Sodium: 138 mmol/L (ref 135–145)
Total Bilirubin: 0.7 mg/dL (ref 0.3–1.2)
Total Protein: 7.4 g/dL (ref 6.5–8.1)

## 2022-02-12 LAB — T4, FREE: Free T4: 0.69 ng/dL (ref 0.61–1.12)

## 2022-02-12 MED ORDER — PREDNISONE 20 MG PO TABS: 60.0000 mg | ORAL_TABLET | ORAL | 0 refills | Status: DC

## 2022-02-12 NOTE — Assessment & Plan Note (Signed)
Continue Eliquis and Plavix.

## 2022-02-12 NOTE — Assessment & Plan Note (Addendum)
Labs reviewed and discussed with patient.  Reviewed and discussed with patient Hold off Durvalumab due to concern of pneumonitis or atypical infection Continue prednisone, taper down to 40 mg daily for 2 weeks followed by 20 mg daily for 1 week.   We will further titrate prednisone dose at the next visit.

## 2022-02-12 NOTE — Assessment & Plan Note (Signed)
Recommend patient to take Calcium 1200mg  daily.

## 2022-02-12 NOTE — Progress Notes (Signed)
Hematology/Oncology Progress note [phone] [phone]      Patient Care Team: [name], MD as PCP - General (Physician Assistant) [name], MD as Referring Physician (Physician Assistant) Bary Castilla, [name], MD (General Surgery) Telford Nab, RN as Oncology Nurse Navigator [name], MD as Consulting Physician (Oncology)   ASSESSMENT & PLAN:   Squamous cell carcinoma of right lung [facility]) Labs reviewed and discussed with patient.  Reviewed and discussed with patient Hold off Durvalumab due to concern of pneumonitis or atypical infection Continue prednisone, taper down to 40 mg daily for 2 weeks followed by 20 mg daily for 1 week.   We will further titrate prednisone dose at the next visit.    Malignant neoplasm metastatic to brain [facility]) Status post radiation.  Repeat MRI   Hypothyroidism due to medication Continue Synthroid 88 mcg daily   Hypocalcemia Recommend patient to take Calcium 1211m daily.   PVD (peripheral vascular disease) (HCC) Continue Eliquis and Plavix.   Orders Placed This Encounter  Procedures   MR Brain W Wo Contrast    Standing Status:   Future    Standing Expiration Date:   02/12/2023    Order Specific Question:   If indicated for the ordered procedure, I authorize the administration of contrast media per Radiology protocol    Answer:   Yes    Order Specific Question:   What is the patient's sedation requirement?    Answer:   No Sedation    Order Specific Question:   Does the patient have a pacemaker or implanted devices?    Answer:   No    Order Specific Question:   Use SRS Protocol?    Answer:   No    Order Specific Question:   [facility]    [facility]   CBC with Differential/Platelet    Standing Status:   Future    Standing Expiration Date:   02/13/2023   Comprehensive metabolic panel    Standing Status:   Future    Standing  Expiration Date:   02/12/2023   TSH    Standing Status:   Future    Standing Expiration Date:   02/13/2023   T4, free    Standing Status:   Future    Standing Expiration Date:   02/13/2023    Follow up in 3 weeks, lab MD.    All questions were answered. The patient knows to call the clinic with any problems, questions or concerns.  [name] MD, PhD [facility] 02/12/2022      CHIEF COMPLAINTS/REASON FOR VISIT:  Follow up for lung cancer  HISTORY OF PRESENTING ILLNESS:  Joshua STAPLESis a 66y.o. male who has above history reviewed by me today presents for follow up visit for Stage IV lung squamous cell carcinoma. Oncology History  [place]  09/18/2020 Imaging   PET scan showed right upper lobe nodule maximal SUV 11.5.  Hypermetabolic cluster right paratracheal adenopathy with maximum SUV up to 12.  No findings of metastatic disease to the neck/abdomen/pelvis or skeleton.   09/20/2020 Initial Diagnosis   Squamous cell carcinoma of right lung (HCC) NGS showed PD-L1 TPS 95%, LRP1B S9966f PIK3CA E545K, RB1 c1422-2A>T, RIT1 T83_ A84del, STK11 [id number] R248W, TMB 19.5mu70mb, MS stable.    09/20/2020 Cancer Staging   Staging form: Lung, AJCC 8th Edition - Clinical stage from 09/20/2020: Stage IV (cT1b,  cN2, cM1) - Signed by [name], MD on 12/13/2021   10/03/2020 Imaging   MRI brain with and without contrast showed no evidence of intracranial metastatic disease. Well-defined round lesion within the left parotid tail measuring approximately 2.3 x 1.8 x 1.8 cm   10/09/2020 - 11/27/2020 Radiation Therapy   concurrent chemoradiation   10/10/2020 - 10/31/2020 Chemotherapy   10/10/2020 concurrent chemoradiation.  carboplatin AUC of 2 and Taxol 45 mg/m2 x 4.  Additional chemotherapy was held due to thrombocytopenia, neutropenia       12/31/2020 -  Chemotherapy   Maintenance  Durvalumab q14d      03/28/2021 Imaging   CT without  contrast showed new linear right perihilar consolidation with associated traction bronchiectasis.  Likely postradiation changes.  Additional new peribronchial vascular/nodular groundglass opacities with areas of consolidation or seen primarily in the right upper and lower lobes, more distant from expected radiation fields. 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  Per my discussion with pulmonology Dr. 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     05/09/2021 Imaging   Repeat CT chest without contrast showed evolving radiation changes in the right perihilar region likely accounting for interval increase to thickening of the posterior wall of the right upper lobe bronchus.  The more peripheral right lung groundglass opacities seen on most recent study have generally improved with exception of one peripherally right upper lobe which appears new.  These are likely inflammatory.  No findings highly suspicious for local recurrence.   05/30/2021 -  Chemotherapy   Patient is on Treatment Plan :  LUNG Durvalumab q14d     10/07/2021 Imaging   MRI brain with and without contrast showed 2 contrast-enhancing lesions, most consistent with metastatic disease.  Largest lesion is in the left frontal lobe and measures up to 1.3 cm with a large amount of surrounding edema and 9 mm rightward midline shift.  The other enhancing lesion was 4 mm in the right occipital lobe with a small amount of surrounding edema.  No acute or chronic hemorrhage.   10/07/2021 Progression   Headache after colonoscopy, progressive difficulty using her right arm.  Progressively worsening of speech. Stage IV lung cancer with brain metastasis.  started on Dexamethasone.  He was seen by neurosurgery and radiation oncology.  Recommendation is to proceed with SRS.  Patient was discharged with dexamethasone    10/16/2021 Imaging   PET scan showed postradiation changes in the right supra hilar lung without evidence of local recurrence.  No evidence of lung cancer recurrence or metastatic disease.  Single focus of intense metabolic activity associated with posterior left 11th rib likely secondary to recent fall/posttraumatic metabolic changes.  Frontal  uptake in the left frontal lobe corresponding to the known metastatic lesion in the brain   10/29/2021 -  Radiation Therapy   Brain Radiation [facility]   12/10/2021 Imaging   Brain MRI: Continued interval decrease in size of a left frontal lobe metastasis, now measuring 10 mm. Moderate surrounding edema has also decreased.  3-4 mm metastasis within the right occipital lobe, not significantly changed in size. There is now a small focus of central non enhancement within this lesion. Increased edema. 3 mm enhancing metastasis within the high left parietal lobe. In retrospect, this was likely present as a subtle punctate focus of enhancement on the prior brain MRI of 10/22/2021.   01/18/2022 Imaging   CT  chest abdomen pelvis w contrast 1. Stable post treatment changes in the right upper lobe medially with dense radiation fibrosis but no findings to suggest local recurrent tumor. 2. Significant progression of ill-defined ground-glass opacity, interstitial thickening and airspace nodularity in the right upper lobe and upper aspect of the right lower lobe. Findings could reflect an inflammatory or atypical infectious process, drug induced pneumonitis or interstitial spread of tumor.3. No mediastinal or hilar mass or adenopathy. 4. No findings for abdominal/pelvic metastatic disease or osseous metastatic disease.    Today he feels well. No new complaints. Chronic SOB, slightly worse. No cough. Intermittent headache, denies any focal deficits. Patient is on prednisone 60 mg daily.  Review of Systems  Constitutional:  Positive for fatigue. Negative for appetite change,  chills, fever and unexpected weight change.  HENT:   Negative for hearing loss and voice change.   Eyes:  Negative for eye problems and icterus.  Respiratory:  Negative for chest tightness, cough and shortness of breath.   Cardiovascular:  Negative for chest pain and leg swelling.  Gastrointestinal:  Negative for abdominal distention and abdominal pain.  Endocrine: Negative for hot flashes.  Genitourinary:  Negative for difficulty urinating, dysuria and frequency.   Musculoskeletal:  Negative for arthralgias.  Skin:  Negative for itching and rash.  Neurological:  Positive for headaches. Negative for light-headedness and numbness.  Hematological:  Negative for adenopathy. Does not bruise/bleed easily.  Psychiatric/Behavioral:  Negative for confusion.       MEDICAL HISTORY:  Past Medical History:  Diagnosis Date   Allergy    Cancer ([place])    lung   Dyspnea    former smoker. only doe   History of kidney stones    Hypertension    Peripheral vascular disease ([place])    Pre-diabetes    Sleep apnea     SURGICAL HISTORY: Past Surgical History:  Procedure Laterality Date   ANTERIOR CERVICAL DECOMP/DISCECTOMY FUSION N/A 03/14/2020   Procedure: ANTERIOR CERVICAL DECOMPRESSION/DISCECTOMY FUSION 3 LEVELS C4-7;  Surgeon: Meade Maw, MD;  Location: [facility] ORS;  Service: Neurosurgery;  Laterality: N/A;   BACK SURGERY  2011   CENTRAL LINE INSERTION Right 09/10/2016   Procedure: CENTRAL LINE INSERTION;  Surgeon: Katha Cabal, MD;  Location: [facility] ORS;  Service: Vascular;  Laterality: Right;   CERVICAL FUSION     C4-c7 on Mar 14, 2020   COLONOSCOPY  2013 ?   COLONOSCOPY N/A 09/09/2021   Procedure: COLONOSCOPY;  Surgeon: [name], DO;  Location: [facility] ENDOSCOPY;  Service: Gastroenterology;  Laterality: N/A;   CORONARY ANGIOPLASTY     ESOPHAGOGASTRODUODENOSCOPY N/A 09/09/2021   Procedure: ESOPHAGOGASTRODUODENOSCOPY (EGD);  Surgeon: [name], DO;  Location: [facility]  ENDOSCOPY;  Service: Gastroenterology;  Laterality: N/A;   FEMORAL-POPLITEAL BYPASS GRAFT Right 09/10/2016   Procedure: BYPASS GRAFT FEMORAL-POPLITEAL ARTERY ( BELOW KNEE );  Surgeon: Katha Cabal, MD;  Location: [facility] ORS;  Service: Vascular;  Laterality: Right;   FEMORAL-TIBIAL BYPASS GRAFT Right 03/23/2020   Procedure: BYPASS GRAFT RIGHT FEMORAL- DISTAL TIBIAL ARTERY WITH DISTAL FLOW GRAFT;  Surgeon: Katha Cabal, MD;  Location: [facility] ORS;  Service: Vascular;  Laterality: Right;   LEFT HEART CATH AND CORONARY ANGIOGRAPHY Left 05/16/2021   Procedure: LEFT HEART CATH AND CORONARY ANGIOGRAPHY;  Surgeon: Andrez Grime, MD;  Location: [place] CV LAB;  Service: Cardiovascular;  Laterality: Left;   LOWER EXTREMITY ANGIOGRAPHY Right 11/18/2016   Procedure: Lower Extremity Angiography;  Surgeon: Katha Cabal, MD;  Location:  [place] CV LAB;  Service: Cardiovascular;  Laterality: Right;   LOWER EXTREMITY ANGIOGRAPHY Right 12/09/2016   Procedure: Lower Extremity Angiography;  Surgeon: Katha Cabal, MD;  Location: [place] CV LAB;  Service: Cardiovascular;  Laterality: Right;   LOWER EXTREMITY ANGIOGRAPHY Right 11/15/2019   Procedure: LOWER EXTREMITY ANGIOGRAPHY;  Surgeon: Katha Cabal, MD;  Location: [place] CV LAB;  Service: Cardiovascular;  Laterality: Right;   LOWER EXTREMITY ANGIOGRAPHY Right 12/01/2019   Procedure: Lower Extremity Angiography;  Surgeon: Algernon Huxley, MD;  Location: [place] CV LAB;  Service: Cardiovascular;  Laterality: Right;   LOWER EXTREMITY ANGIOGRAPHY Right 12/02/2019   Procedure: LOWER EXTREMITY ANGIOGRAPHY;  Surgeon: Katha Cabal, MD;  Location: [place] [facility];  Service: Cardiovascular;  Laterality: Right;   LOWER EXTREMITY ANGIOGRAPHY Right 03/23/2020   Procedure: Lower Extremity Angiography;  Surgeon: Katha Cabal, MD;  Location: [place] CV LAB;  Service: Cardiovascular;  Laterality: Right;   LOWER  EXTREMITY ANGIOGRAPHY Right 09/25/2021   Procedure: Lower Extremity Angiography;  Surgeon: Katha Cabal, MD;  Location: [place] CV LAB;  Service: Cardiovascular;  Laterality: Right;   LOWER EXTREMITY ANGIOGRAPHY Right 09/26/2021   Procedure: Lower Extremity Angiography;  Surgeon: Algernon Huxley, MD;  Location: [place] CV LAB;  Service: Cardiovascular;  Laterality: Right;   LOWER EXTREMITY INTERVENTION  11/18/2016   Procedure: Lower Extremity Intervention;  Surgeon: Katha Cabal, MD;  Location: [place] CV LAB;  Service: Cardiovascular;;   PERIPHERAL VASCULAR CATHETERIZATION Right 01/02/2015   Procedure: Lower Extremity Angiography;  Surgeon: Katha Cabal, MD;  Location: [place] CV LAB;  Service: Cardiovascular;  Laterality: Right;   PERIPHERAL VASCULAR CATHETERIZATION Right 06/18/2015   Procedure: Lower Extremity Angiography;  Surgeon: Algernon Huxley, MD;  Location: [place] CV LAB;  Service: Cardiovascular;  Laterality: Right;   PERIPHERAL VASCULAR CATHETERIZATION  06/18/2015   Procedure: Lower Extremity Intervention;  Surgeon: Algernon Huxley, MD;  Location: [place] CV LAB;  Service: Cardiovascular;;   PERIPHERAL VASCULAR CATHETERIZATION N/A 10/09/2015   Procedure: Abdominal Aortogram w/Lower Extremity;  Surgeon: Katha Cabal, MD;  Location: [place] CV LAB;  Service: Cardiovascular;  Laterality: N/A;   PERIPHERAL VASCULAR CATHETERIZATION  10/09/2015   Procedure: Lower Extremity Intervention;  Surgeon: Katha Cabal, MD;  Location: [place] CV LAB;  Service: Cardiovascular;;   PERIPHERAL VASCULAR CATHETERIZATION Right 10/10/2015   Procedure: Lower Extremity Angiography;  Surgeon: Algernon Huxley, MD;  Location: [place] CV LAB;  Service: Cardiovascular;  Laterality: Right;   PERIPHERAL VASCULAR CATHETERIZATION Right 10/10/2015   Procedure: Lower Extremity Intervention;  Surgeon: Algernon Huxley, MD;  Location: [place] CV LAB;  Service:  Cardiovascular;  Laterality: Right;   PERIPHERAL VASCULAR CATHETERIZATION Right 12/03/2015   Procedure: Lower Extremity Angiography;  Surgeon: Algernon Huxley, MD;  Location: [place] [facility] LAB;  Service: Cardiovascular;  Laterality: Right;   PERIPHERAL VASCULAR CATHETERIZATION Right 12/04/2015   Procedure: Lower Extremity Angiography;  Surgeon: Algernon Huxley, MD;  Location: [place] CV LAB;  Service: Cardiovascular;  Laterality: Right;   PERIPHERAL VASCULAR CATHETERIZATION  12/04/2015   Procedure: Lower Extremity Intervention;  Surgeon: Algernon Huxley, MD;  Location: [place] [facility] LAB;  Service: Cardiovascular;;   PERIPHERAL VASCULAR CATHETERIZATION Right 06/30/2016   Procedure: Lower Extremity Angiography;  Surgeon: Algernon Huxley, MD;  Location: [place] [facility] LAB;  Service: Cardiovascular;  Laterality: Right;   PERIPHERAL VASCULAR CATHETERIZATION Right 06/25/2016   Procedure:  Lower Extremity Angiography;  Surgeon: Katha Cabal, MD;  Location: [place] [facility] LAB;  Service: Cardiovascular;  Laterality: Right;   PERIPHERAL VASCULAR CATHETERIZATION Right 07/01/2016   Procedure: Lower Extremity Angiography;  Surgeon: Katha Cabal, MD;  Location: [place] CV LAB;  Service: Cardiovascular;  Laterality: Right;   PERIPHERAL VASCULAR CATHETERIZATION  07/01/2016   Procedure: Lower Extremity Intervention;  Surgeon: Katha Cabal, MD;  Location: [place] CV LAB;  Service: Cardiovascular;;   PERIPHERAL VASCULAR CATHETERIZATION Right 08/01/2016   Procedure: Lower Extremity Angiography;  Surgeon: Katha Cabal, MD;  Location: [place] [facility] LAB;  Service: Cardiovascular;  Laterality: Right;   PORTA CATH INSERTION N/A 10/05/2020   Procedure: PORTA CATH INSERTION;  Surgeon: Katha Cabal, MD;  Location: [place] CV LAB;  Service: Cardiovascular;  Laterality: N/A;   stent placement in right leg Right    VIDEO BRONCHOSCOPY N/A 09/14/2020   Procedure: VIDEO BRONCHOSCOPY WITH  FLUORO;  Surgeon: Tyler Pita, MD;  Location: [facility] ORS;  Service: Cardiopulmonary;  Laterality: N/A;   VIDEO BRONCHOSCOPY WITH ENDOBRONCHIAL ULTRASOUND N/A 09/14/2020   Procedure: VIDEO BRONCHOSCOPY WITH ENDOBRONCHIAL ULTRASOUND;  Surgeon: Tyler Pita, MD;  Location: [facility] ORS;  Service: Cardiopulmonary;  Laterality: N/A;    SOCIAL HISTORY: Social History   Socioeconomic History   Marital status: Single    Spouse name: Not on file   Number of children: Not on file   Years of education: Not on file   Highest education level: Not on file  Occupational History   Occupation: unemployed  Tobacco Use   Smoking status: Former    Packs/day: 2.00    Years: 38.00    Total pack years: 76.00    Types: Cigarettes    Quit date: 12/20/2014    Years since quitting: 7.1   Smokeless tobacco: Never   Tobacco comments:    quit smoking in 2017 most smoked 2   Vaping Use   Vaping Use: Former  Substance and Sexual Activity   Alcohol use: Not Currently   Drug use: No   Sexual activity: Yes  Other Topics Concern   Not on file  Social History Narrative   Not on file   Social Determinants of Health   Financial Resource Strain: Not on file  Food Insecurity: Not on file  Transportation Needs: Not on file  Physical Activity: Not on file  Stress: Not on file  Social Connections: Not on file  Intimate Partner Violence: Not on file    FAMILY HISTORY: Family History  Problem Relation Age of Onset   Diabetes Mother    Hypertension Mother    Heart murmur Mother    Leukemia Mother    Throat cancer Maternal Grandmother    Colon cancer Neg Hx    Breast cancer Neg Hx     ALLERGIES:  has no active allergies.  MEDICATIONS:  Current Outpatient Medications  Medication Sig Dispense Refill   acetaminophen (TYLENOL) 325 MG tablet Take 650 mg by mouth every 6 (six) hours as needed for moderate pain.     clopidogrel (PLAVIX) 75 MG tablet TAKE 1 TABLET BY MOUTH  DAILY 100 tablet 2    ELIQUIS 5 MG TABS tablet TAKE 1 TABLET BY MOUTH  TWICE DAILY 120 tablet 5   levETIRAcetam (KEPPRA) 500 MG tablet Take 1 tablet (500 mg total) by mouth 2 (two) times daily. 180 tablet 3   levothyroxine (SYNTHROID) 88 MCG tablet Take 1 tablet (88 mcg total) by mouth  daily before breakfast. 30 tablet 2   omeprazole (PRILOSEC) 40 MG capsule Take 1 capsule (40 mg total) by mouth daily. 90 capsule 1   simvastatin (ZOCOR) 40 MG tablet Take 40 mg by mouth at bedtime.     Tiotropium Bromide-Olodaterol (STIOLTO RESPIMAT) 2.5-2.5 MCG/ACT AERS Inhale 2 puffs into the lungs daily. 4 g 0   predniSONE (DELTASONE) 20 MG tablet Take 3 tablets (60 mg total) by mouth See admin instructions. Take 2 tablets daily for 2 weeks, followed by 1 tablet daily for 1 week. 90 tablet 0   No current facility-administered medications for this visit.     PHYSICAL EXAMINATION: ECOG PERFORMANCE STATUS: 1 - Symptomatic but completely ambulatory Vitals:   02/12/22 1013  BP: (!) 148/77  Pulse: 86  Resp: 18  Temp: (!) 97.5 F (36.4 C)  SpO2: 98%   Filed Weights   02/12/22 1013  Weight: 209 lb 12.8 oz (95.2 kg)    Physical Exam Constitutional:      General: He is not in acute distress.    Appearance: He is not diaphoretic.     Comments: Patient walks with a cane  HENT:     Head: Normocephalic and atraumatic.     Nose: Nose normal.     Mouth/Throat:     Pharynx: No oropharyngeal exudate.  Eyes:     General: No scleral icterus.    Pupils: Pupils are equal, round, and reactive to light.  Cardiovascular:     Rate and Rhythm: Normal rate and regular rhythm.     Heart sounds: No murmur heard. Pulmonary:     Effort: Pulmonary effort is normal. No respiratory distress.     Breath sounds: No rales.  Chest:     Chest wall: No tenderness.  Abdominal:     General: There is no distension.     Palpations: Abdomen is soft.     Tenderness: There is no abdominal tenderness.  Musculoskeletal:        General: Normal range  of motion.     Cervical back: Normal range of motion and neck supple.  Skin:    General: Skin is warm and dry.     Findings: No erythema.  Neurological:     Mental Status: He is alert and oriented to person, place, and time.     Cranial Nerves: No cranial nerve deficit.     Motor: No abnormal muscle tone.     Coordination: Coordination normal.  Psychiatric:        Mood and Affect: Affect normal.      LABORATORY DATA:  I have reviewed the data as listed    Latest Ref Rng & Units 02/12/2022    9:59 AM 01/28/2022   11:02 AM 01/24/2022    9:02 AM  CBC  WBC 4.0 - 10.5 K/uL 12.9  13.2  4.4   Hemoglobin 13.0 - 17.0 g/dL 13.1  11.8  12.0   Hematocrit 39.0 - 52.0 % 40.5  36.9  37.3   Platelets 150 - 400 K/uL 203  274  285       Latest Ref Rng & Units 02/12/2022    9:59 AM 01/28/2022   11:02 AM 01/24/2022    9:02 AM  CMP  Glucose 70 - 99 mg/dL 118  142  168   BUN 8 - 23 mg/dL _0 Creatinine 0.61 - 1.24 mg/dL 1.04  1.16  1.20   Sodium 135 - 145 mmol/L 138  141  140   Potassium 3.5 - 5.1 mmol/L 4.2  4.0  3.7   Chloride 98 - 111 mmol/L 103  106  105   CO2 22 - 32 mmol/L _0 Calcium 8.9 - 10.3 mg/dL 9.4  9.1  9.2   Total Protein 6.5 - 8.1 g/dL 7.4  7.1  7.1   Total Bilirubin 0.3 - 1.2 mg/dL 0.7  0.5  0.6   Alkaline Phos 38 - 126 U/L 53  64  62   AST 15 - 41 U/L _1 ALT 0 - 44 U/L 34  18  17      RADIOGRAPHIC STUDIES: I have personally reviewed the radiological images as listed and agreed with the findings in the report. DG Chest 2 View  Result Date: 02/11/2022 CLINICAL DATA:  History of pneumonitis.  Follow-up. EXAM: CHEST - 2 VIEW COMPARISON:  CT C AP January 17, 2022; chest radiograph January 14, 2021 FINDINGS: Right anterior chest wall Port-A-Cath is present with tip projecting over the superior vena cava. Redemonstrated right suprahilar masslike opacity. Scattered heterogeneous opacities right mid and lower lung. No pleural effusion or pneumothorax. Thoracic  spine degenerative changes. IMPRESSION: Redemonstrated right mid and lower lung airspace opacities which may represent infectious process in the appropriate clinical setting. Alternative etiologies such as interstitial spread of tumor not excluded. Consider short-term follow-up chest CT. Electronically Signed   By: Lovey Newcomer M.D.   On: 02/11/2022 11:00   CT CHEST ABDOMEN PELVIS W CONTRAST  Result Date: 01/18/2022 CLINICAL DATA:  Restaging metastatic lung cancer. EXAM: CT CHEST, ABDOMEN, AND PELVIS WITH CONTRAST TECHNIQUE: Multidetector CT imaging of the chest, abdomen and pelvis was performed following the standard protocol during bolus administration of intravenous contrast. RADIATION DOSE REDUCTION: This exam was performed according to the departmental dose-optimization program which includes automated exposure control, adjustment of the mA and/or kV according to patient size and/or use of iterative reconstruction technique. CONTRAST:  146m OMNIPAQUE IOHEXOL 300 MG/ML  SOLN COMPARISON:  PET-CT 10/16/2021 and prior chest CT 08/22/2021 FINDINGS: CT CHEST FINDINGS Cardiovascular: The heart is normal in size. No pericardial effusion. The aorta is normal in caliber. No dissection. The branch vessels are patent. Stable right IJ power port. Scattered coronary artery calcifications are stable. Mediastinum/Nodes: No mediastinal or hilar mass or lymphadenopathy. The esophagus is grossly normal. 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 I do not see any progressive soft tissue thickening to suggest recurrent tumor. However, there is significant progression of ill-defined ground-glass opacity, interstitial thickening and airspace nodularity in the right upper lobe and also in the upper aspect of the right lower lobe. Findings could reflect an inflammatory or infectious process but interstitial spread of tumor is also possible. Drug induced pneumonitis  in combination with radiation pneumonitis would be another possibility. The left lung remains relatively clear. No pleural effusions or pleural lesions. Musculoskeletal: No chest wall mass, supraclavicular or axillary adenopathy. The bony thorax is intact. CT ABDOMEN PELVIS FINDINGS Hepatobiliary: No hepatic lesions to suggest metastatic disease. No intrahepatic biliary dilatation. The gallbladder is unremarkable. No common bile duct dilatation. Pancreas: No mass, inflammation or ductal dilatation. Spleen: Normal size. No focal lesions. Adrenals/Urinary Tract: Adrenal glands and kidneys are unremarkable. The bladder is normal. Stomach/Bowel: The stomach, duodenum, small bowel and colon are unremarkable. No acute inflammatory process, mass lesion or obstructive finding. The terminal ileum and appendix are normal. There is  a metallic clip in the cecum possibly related to a prior colonoscopy and biopsy. Stable significant age advanced sigmoid colon diverticulosis. Vascular/Lymphatic: Scattered atherosclerotic calcifications involving the aorta and iliac arteries. Right femoral artery stents are noted. No mesenteric or retroperitoneal mass or adenopathy. Reproductive: The prostate gland and seminal vesicles are unremarkable. Other: No pelvic mass or adenopathy. No free pelvic fluid collections. No inguinal mass or adenopathy. No abdominal wall hernia or subcutaneous lesions. Musculoskeletal: No significant bony findings. No lytic or sclerotic bone lesions to suggest metastatic disease. Stable lumbar fusion hardware at L5-S1. IMPRESSION: 1. Stable post treatment changes in the right upper lobe medially with dense radiation fibrosis but no findings to suggest local recurrent tumor. 2. Significant progression of ill-defined ground-glass opacity, interstitial thickening and airspace nodularity in the right upper lobe and upper aspect of the right lower lobe. Findings could reflect an inflammatory or atypical infectious  process, drug induced pneumonitis or interstitial spread of tumor. 3. No mediastinal or hilar mass or adenopathy. 4. No findings for abdominal/pelvic metastatic disease or osseous metastatic disease. * Tracking Code: BO * Aortic Atherosclerosis (ICD10-I70.0). Electronically Signed   By: Marijo Sanes M.D.   On: 01/18/2022 14:56   VAS [place] LOWER EXTREMITY ARTERIAL DUPLEX  Result Date: 12/18/2021 LOWER EXTREMITY ARTERIAL DUPLEX STUDY Patient Name:  SEDALE JENIFER  Date of Exam:   12/11/2021 Medical Rec #: [id number]            Accession #:    [id number] Date of Birth: 10/14/1955           Patient Gender: M Patient Age:   66 years Exam Location:  [facility] Procedure:      VAS [place] LOWER EXTREMITY ARTERIAL DUPLEX Referring Phys: Eulogio Ditch --------------------------------------------------------------------------------  Indications: Peripheral artery disease, and pain post workout. Other Factors: ASO s/p multiple interventions and bypass.                11/15/2019 Right thrombectomy of Fempop insitu graft with added                stent throughout.                 12/01/2019: Aortogram and Selctive Right Lower Extremity                Angiogram. 6 mg of TPA delivered in a lysis catheterfrom the                Origin of the Right Femoral -Popliteal bypass down the proximal                ATA with placement of the lysis.                 12/02/2019: PTA of the Right Posterior Tibial Artery. PTA of the                Right Peroneal Artery. Mechanical Thrombectomy of the Right                Femoral -Popliteal Bypass graft. Mechanical Thrombectomy of the                Right Tibioperoneal Trunk and Posterior Tibial Artery.                 09/25/2021: Right Lower Extremity Angiography third order                catheter placement. PTA of the proximal peroneal and  tibioperoneal trunk to 2.5 mm with an ultra score balloon. PTA of                the Femoral Artery and the proximal anstomosis of  the bypass                graft to 4 mm with an ultra score balloon. Administration of 5 mg                of tpa throughout the entire bypass and in the peroneal Artery.                Initiation of continuous tpa infusion Right lower Extremity for                limb salvage. Insertion of a triple lumen catheter Left CFV with                [place] and fluoroscopic guidance.                 09/26/2021: Right LE Angiogram. PTA of the Right Posterior Tibial                Artery with 2.5 mm diameter by 30 cm length angioplasty balloon.                PTA of the distal bypass anastomosis and distal bypass graft                analogous to the Popliteal artery with 4 mm diameter by 8 cm                length Lutonix drug coated angioplasty balloon.  Vascular Interventions: Hx of multiple right lower extremity PTA's/stents.                         09/10/2016 Right femoral to below the knee popliteal                         artery bypass graft placement. 5/01 Right bypass graft &                         mid popliteal artery PTA & coil embolectomy of large                         branch of bypass graft. 12/09/16 Right distal bypass                         graft PTA/stent and right posterior tibial artery PTA.                         03/23/2020 Rt PTA thrombectomy. Rt Profunda fem artery to                         PTA bypass. Current ABI:            n/a Performing Technologist: Concha Norway RVT  Examination Guidelines: A complete evaluation includes B-mode imaging, spectral Doppler, color Doppler, and power Doppler as needed of all accessible portions of each vessel. Bilateral testing is considered an integral part of a complete examination. Limited examinations for reoccurring indications may be performed as noted.  +-----------+--------+-----+--------+----------+--------+ RIGHT      PSV cm/sRatioStenosisWaveform  Comments +-----------+--------+-----+--------+----------+--------+ CFA Mid  57                                       +-----------+--------+-----+--------+----------+--------+ DFA        40                                      +-----------+--------+-----+--------+----------+--------+ ATA Distal 105                  monophasic         +-----------+--------+-----+--------+----------+--------+ PTA Distal 11                   biphasic           +-----------+--------+-----+--------+----------+--------+ PERO Distal11                   monophasic         +-----------+--------+-----+--------+----------+--------+  Right Graft #1: fempta +------------------+--------+--------+--------+--------+                   PSV cm/sStenosisWaveformComments +------------------+--------+--------+--------+--------+ Inflow            57                               +------------------+--------+--------+--------+--------+ Prox Anastomosis  29                               +------------------+--------+--------+--------+--------+ Proximal Graft    37                               +------------------+--------+--------+--------+--------+ Mid Graft         30                               +------------------+--------+--------+--------+--------+ Distal Graft      27                               +------------------+--------+--------+--------+--------+ Distal Anastomosis37                               +------------------+--------+--------+--------+--------+ Outflow           50                               +------------------+--------+--------+--------+--------+   Summary: Right: Patent BPG with no evidence of stenosis.  See table(s) above for measurements and observations. Electronically signed by Leotis Pain MD on 12/18/2021 at 9:15:55 AM.    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 EXAM: MRI HEAD WITHOUT AND WITH CONTRAST TECHNIQUE: Multiplanar, multiecho pulse  sequences of the brain and surrounding structures were obtained without and with intravenous contrast. CONTRAST:  58m GADAVIST GADOBUTROL 1 MMOL/ML IV SOLN COMPARISON:  Prior brain MRI examinations 10/22/2021 and earlier. FINDINGS: Brain: No age advanced or lobar predominant parenchymal atrophy. Continued interval decrease in size of an  enhancing metastasis the subcortical white matter of the posterolateral left frontal lobe, now measuring 10 mm (previously measuring 12 mm) (series 18, image 107). Moderate surrounding vasogenic edema has also decreased. A 3-4 mm enhancing lesion within the right occipital lobe has not significantly changed in size. There is a central focus of non enhancement within this lesion, which is new from the prior MRI of 10/22/2021. Mild surrounding edema has slightly increased. 3 mm enhancing metastasis within the high left parietal lobe (series 18, image 146). In retrospect, this was likely present as a subtle punctate focus of enhancement on the prior brain MRI of 10/22/2021. Unchanged punctate enhancing focus within the left cerebellar hemisphere, favored to reflect vascular enhancement (series 18, image 35). Background mild-to-moderate chronic small vessel ischemic changes within the cerebral white matter, stable. Redemonstrated tiny T2 hyperintense focus within the right cerebellar hemisphere, which may reflect a chronic lacunar infarct (series 10, image 7). There is no acute infarct No chronic intracranial blood products. No extra-axial fluid collection. No midline shift. Vascular: Maintained flow voids within the proximal large arterial vessels. Skull and upper cervical spine: No focal suspicious marrow lesion. Sinuses/Orbits: No mass or acute finding within the imaged orbits. Mild mucosal thickening within the bilateral ethmoid and maxillary sinuses. Superimposed small mucous retention cyst within the right maxillary sinus. Other: Trace fluid within the left mastoid air cells. Known  left parotid mass, incompletely imaged. Impression #3 will be called to the ordering clinician or representative by the Radiologist Assistant, and communication documented in the PACS or [employer]. IMPRESSION: Since the prior brain MRI of 10/22/2021, there has been a continued interval decrease in size of a left frontal lobe metastasis, now measuring 10 mm. Moderate surrounding edema has also decreased. 3-4 mm metastasis within the right occipital lobe, not significantly changed in size. There is now a small focus of central non enhancement within this lesion. Surrounding edema has slightly increased, possibly treatment-related. Attention recommended on imaging follow-up. 3 mm enhancing metastasis within the high left parietal lobe. In retrospect, this was likely present as a subtle punctate focus of enhancement on the prior brain MRI of 10/22/2021. Unchanged punctate focus of enhancement within the left cerebellar hemisphere, which is favored vascular. Attention recommended on follow-up. 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 Electronically Signed   By: Kellie Simmering D.O.   On: 12/10/2021 14:51

## 2022-02-12 NOTE — Assessment & Plan Note (Signed)
Status post radiation.  Repeat MRI

## 2022-02-12 NOTE — Progress Notes (Signed)
Pt here for follow. Pt reports starts getting occasional dizzy spells and occasional hot flashes

## 2022-02-12 NOTE — Assessment & Plan Note (Signed)
Continue Synthroid 88 mcg daily

## 2022-02-13 ENCOUNTER — Telehealth: Payer: Self-pay | Admitting: Pulmonary Disease

## 2022-02-13 ENCOUNTER — Other Ambulatory Visit: Payer: Self-pay

## 2022-02-14 NOTE — Telephone Encounter (Signed)
Called and spoke with the pt  He states he is unable to afford the Stiolto  He is asking about possible substitute that comes in generic  Advised pt I will send this msg to pharm team to check on this  Please advise, thanks

## 2022-02-18 ENCOUNTER — Other Ambulatory Visit (HOSPITAL_COMMUNITY): Payer: Self-pay

## 2022-02-18 ENCOUNTER — Encounter: Payer: Self-pay | Admitting: Oncology

## 2022-02-18 MED ORDER — STIOLTO RESPIMAT 2.5-2.5 MCG/ACT IN AERS: 2.0000 | INHALATION_SPRAY | Freq: Every day | RESPIRATORY_TRACT | 3 refills | Status: DC

## 2022-02-18 MED ORDER — STIOLTO RESPIMAT 2.5-2.5 MCG/ACT IN AERS: 2.0000 | INHALATION_SPRAY | Freq: Every day | RESPIRATORY_TRACT | 11 refills | Status: DC

## 2022-02-18 NOTE — Telephone Encounter (Signed)
Spoke to patient and relayed below message. He voiced his understanding.  He stated that 47 dollars is affordable. Rx sent to preferred pharmacy per patient request. Nothing further needed.

## 2022-02-18 NOTE — Addendum Note (Signed)
Addended by: Claudette Head A on: 02/18/2022 01:47 PM   Modules accepted: Orders

## 2022-02-19 ENCOUNTER — Ambulatory Visit
Admission: RE | Admit: 2022-02-19 | Discharge: 2022-02-19 | Disposition: A | Discharge status: Home / Self Care | Payer: Medicare Other | Source: Ambulatory Visit | Attending: Oncology | Admitting: Oncology

## 2022-02-19 DIAGNOSIS — C3491 Malignant neoplasm of unspecified part of right bronchus or lung: Secondary | ICD-10-CM | POA: Insufficient documentation

## 2022-02-19 MED ORDER — GADOBUTROL 1 MMOL/ML IV SOLN
9.0000 mL | Freq: Once | INTRAVENOUS | Status: AC | PRN
  Administered 2022-02-19: 9 mL via INTRAVENOUS

## 2022-02-19 NOTE — Telephone Encounter (Signed)
Noted  

## 2022-03-05 ENCOUNTER — Inpatient Hospital Stay (HOSPITAL_BASED_OUTPATIENT_CLINIC_OR_DEPARTMENT_OTHER): Payer: Medicare Other | Admitting: Oncology

## 2022-03-05 ENCOUNTER — Encounter: Payer: Self-pay | Admitting: Oncology

## 2022-03-05 ENCOUNTER — Inpatient Hospital Stay: Payer: Medicare Other | Attending: Oncology

## 2022-03-05 VITALS — BP 130/81 | HR 108 | Temp 99.1°F | Wt 215.0 lb

## 2022-03-05 DIAGNOSIS — I739 Peripheral vascular disease, unspecified: Secondary | ICD-10-CM

## 2022-03-05 DIAGNOSIS — C3411 Malignant neoplasm of upper lobe, right bronchus or lung: Secondary | ICD-10-CM | POA: Diagnosis present

## 2022-03-05 DIAGNOSIS — C7931 Secondary malignant neoplasm of brain: Secondary | ICD-10-CM | POA: Insufficient documentation

## 2022-03-05 DIAGNOSIS — C3491 Malignant neoplasm of unspecified part of right bronchus or lung: Secondary | ICD-10-CM | POA: Diagnosis not present

## 2022-03-05 DIAGNOSIS — Z79899 Other long term (current) drug therapy: Secondary | ICD-10-CM | POA: Insufficient documentation

## 2022-03-05 LAB — CBC WITH DIFFERENTIAL/PLATELET
Abs Immature Granulocytes: 0.02 10*3/uL (ref 0.00–0.07)
Basophils Absolute: 0 10*3/uL (ref 0.0–0.1)
Basophils Relative: 0 %
Eosinophils Absolute: 0.1 10*3/uL (ref 0.0–0.5)
Eosinophils Relative: 1 %
HCT: 40.1 % (ref 39.0–52.0)
Hemoglobin: 12.5 g/dL — ABNORMAL LOW (ref 13.0–17.0)
Immature Granulocytes: 0 %
Lymphocytes Relative: 9 %
Lymphs Abs: 0.5 10*3/uL — ABNORMAL LOW (ref 0.7–4.0)
MCH: 27.9 pg (ref 26.0–34.0)
MCHC: 31.2 g/dL (ref 30.0–36.0)
MCV: 89.5 fL (ref 80.0–100.0)
Monocytes Absolute: 0.5 10*3/uL (ref 0.1–1.0)
Monocytes Relative: 9 %
Neutro Abs: 4.2 10*3/uL (ref 1.7–7.7)
Neutrophils Relative %: 81 %
Platelets: 140 10*3/uL — ABNORMAL LOW (ref 150–400)
RBC: 4.48 MIL/uL (ref 4.22–5.81)
RDW: 14.4 % (ref 11.5–15.5)
WBC: 5.3 10*3/uL (ref 4.0–10.5)
nRBC: 0 % (ref 0.0–0.2)

## 2022-03-05 LAB — T4, FREE: Free T4: 0.74 ng/dL (ref 0.61–1.12)

## 2022-03-05 LAB — COMPREHENSIVE METABOLIC PANEL
ALT: 43 U/L (ref 0–44)
AST: 30 U/L (ref 15–41)
Albumin: 3.8 g/dL (ref 3.5–5.0)
Alkaline Phosphatase: 47 U/L (ref 38–126)
Anion gap: 7 (ref 5–15)
BUN: 20 mg/dL (ref 8–23)
CO2: 30 mmol/L (ref 22–32)
Calcium: 9.2 mg/dL (ref 8.9–10.3)
Chloride: 105 mmol/L (ref 98–111)
Creatinine, Ser: 1.1 mg/dL (ref 0.61–1.24)
GFR, Estimated: >60 mL/min (ref >60)
Glucose, Bld: 76 mg/dL (ref 70–99)
Potassium: 4.1 mmol/L (ref 3.5–5.1)
Sodium: 142 mmol/L (ref 135–145)
Total Bilirubin: 0.6 mg/dL (ref 0.3–1.2)
Total Protein: 7 g/dL (ref 6.5–8.1)

## 2022-03-05 LAB — TSH: TSH: 102 u[IU]/mL — ABNORMAL HIGH (ref 0.350–4.500)

## 2022-03-06 ENCOUNTER — Ambulatory Visit (INDEPENDENT_AMBULATORY_CARE_PROVIDER_SITE_OTHER): Payer: Medicare Other | Admitting: Pulmonary Disease

## 2022-03-06 ENCOUNTER — Encounter: Payer: Self-pay | Admitting: Pulmonary Disease

## 2022-03-06 ENCOUNTER — Encounter: Payer: Self-pay | Admitting: Oncology

## 2022-03-06 VITALS — BP 142/72 | HR 100 | Temp 98.1°F | Ht 71.0 in | Wt 213.0 lb

## 2022-03-06 DIAGNOSIS — C3490 Malignant neoplasm of unspecified part of unspecified bronchus or lung: Secondary | ICD-10-CM | POA: Diagnosis not present

## 2022-03-06 DIAGNOSIS — J449 Chronic obstructive pulmonary disease, unspecified: Secondary | ICD-10-CM

## 2022-03-06 DIAGNOSIS — J189 Pneumonia, unspecified organism: Secondary | ICD-10-CM

## 2022-03-06 DIAGNOSIS — C7931 Secondary malignant neoplasm of brain: Secondary | ICD-10-CM

## 2022-03-06 NOTE — Assessment & Plan Note (Signed)
Status post radiation.  Repeat MRI showed decreased brain lesion size and edema.

## 2022-03-06 NOTE — Assessment & Plan Note (Signed)
Continue Eliquis and Plavix.

## 2022-03-06 NOTE — Patient Instructions (Signed)
Continue your inhaler (Stiolto)  See you in follow-up in 2 to 3 months time call sooner should any new problems arise.

## 2022-03-06 NOTE — Assessment & Plan Note (Signed)
Labs reviewed and discussed with patient.  Reviewed and discussed with patient Hold off Durvalumab due to concern of pneumonitis or atypical infection He has self stopped 20mg  prednisone for a few days, recommend him to take 10mg  daily for 3 days and stop.  Repeat CT in September 2023

## 2022-03-06 NOTE — Progress Notes (Signed)
Hematology/Oncology Progress note [phone] [phone]      Patient Care Team: [name], MD as PCP - General (Physician Assistant) [name], MD as Referring Physician (Physician Assistant) Bary Castilla, [name], MD (General Surgery) Telford Nab, RN as Oncology Nurse Navigator [name], MD as Consulting Physician (Oncology)   ASSESSMENT & PLAN:   Cancer Staging  Squamous cell carcinoma of right lung [facility]) Staging form: Lung, AJCC 8th Edition - Clinical stage from 09/20/2020: Stage IV (cT1b, cN2, cM1) - Signed by [name], MD on 12/13/2021   Squamous cell carcinoma of right lung [facility]) Labs reviewed and discussed with patient.  Reviewed and discussed with patient Hold off Durvalumab due to concern of pneumonitis or atypical infection He has self stopped $RemoveBefo'20mg'wvUnqoBXwye$  prednisone for a few days, recommend him to take $Remov'10mg'rZfnGY$  daily for 3 days and stop.  Repeat CT in September 2023    PVD (peripheral vascular disease) (HCC) Continue Eliquis and Plavix.   Malignant neoplasm metastatic to brain [facility]) Status post radiation.  Repeat MRI showed decreased brain lesion size and edema.    Orders Placed This Encounter  Procedures   CT Chest W Contrast    Standing Status:   Future    Standing Expiration Date:   03/05/2023    Order Specific Question:   If indicated for the ordered procedure, I authorize the administration of contrast media per Radiology protocol    Answer:   Yes    Order Specific Question:   Preferred imaging location?    Answer:   [facility]   CBC with Differential/Platelet    Standing Status:   Future    Standing Expiration Date:   03/06/2023   Comprehensive metabolic panel    Standing Status:   Future    Standing Expiration Date:   03/05/2023   TSH    Standing Status:   Future    Standing Expiration Date:   03/06/2023   T4, free    Standing Status:   Future    Standing Expiration Date:   03/06/2023    Follow up after CT     All questions were answered. The patient knows to call the clinic with any problems, questions or concerns.  [name], MD, PhD [facility] Hematology Oncology 03/05/2022      CHIEF COMPLAINTS/REASON FOR VISIT:  Follow up for lung cancer  HISTORY OF PRESENTING ILLNESS:  Joshua [name] is a 66 y.o. male who has above history reviewed by me today presents for follow up visit for Stage IV lung squamous cell carcinoma. Oncology History  Squamous cell carcinoma of right lung ([place])  09/18/2020 Imaging   PET scan showed right upper lobe nodule maximal SUV 11.5.  Hypermetabolic cluster right paratracheal adenopathy with maximum SUV up to 12.  No findings of metastatic disease to the neck/abdomen/pelvis or skeleton.   09/20/2020 Initial Diagnosis   Squamous cell carcinoma of right lung (HCC) NGS showed PD-L1 TPS 95%, LRP1B S97fs, PIK3CA E545K, RB1 c1422-2A>T, RIT1 T83_ A84del, STK11 P230fs, TPS53 R248W, TMB 19.62mut/mb, MS stable.    09/20/2020 Cancer Staging   Staging form: Lung, AJCC 8th Edition - Clinical stage from 09/20/2020: Stage IV (cT1b, cN2, cM1) - Signed by [name], MD on 12/13/2021   10/03/2020 Imaging   MRI brain with and without contrast showed no evidence of intracranial metastatic disease. Well-defined round lesion within the left parotid tail measuring approximately 2.3 x 1.8 x 1.8 cm   10/09/2020 - 11/27/2020 Radiation Therapy  concurrent chemoradiation   10/10/2020 - 10/31/2020 Chemotherapy   10/10/2020 concurrent chemoradiation.  carboplatin AUC of 2 and Taxol 45 mg/m2 x 4.  Additional chemotherapy was held due to thrombocytopenia, neutropenia       12/31/2020 - 03/21/2022 Chemotherapy   Maintenance  Durvalumab q14d      03/28/2021 Imaging   CT without contrast showed new linear right perihilar consolidation with associated traction bronchiectasis.  Likely postradiation changes.  Additional new peribronchial vascular/nodular groundglass opacities with areas of  consolidation or seen primarily in the right upper and lower lobes, more distant from expected radiation fields. 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  Per my discussion with pulmonology Dr. 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     05/09/2021 Imaging   Repeat CT chest without contrast showed evolving radiation changes in the right perihilar region likely accounting for interval increase to thickening of the posterior wall of the right upper lobe bronchus.  The more peripheral right lung groundglass opacities seen on most recent study have generally improved with exception of one peripherally right upper lobe which appears new.  These are likely inflammatory.  No findings highly suspicious for local recurrence.   05/30/2021 - 01/10/2022 Chemotherapy   Durvalumab q14d     10/07/2021 Imaging   MRI brain with and without contrast showed 2 contrast-enhancing lesions, most consistent with metastatic disease.  Largest lesion is in the left frontal lobe and measures up to 1.3 cm with a large amount of surrounding edema and 9 mm rightward midline shift.  The other enhancing lesion was 4 mm in the right occipital lobe with a small amount of surrounding edema.  No acute or chronic hemorrhage.   10/07/2021 Progression   Headache after colonoscopy, progressive difficulty using her right arm.  Progressively worsening of speech. Stage IV lung cancer with brain metastasis.  started on Dexamethasone.  He was seen by neurosurgery and radiation oncology.  Recommendation is to proceed with SRS.  Patient was discharged with dexamethasone   10/16/2021 Imaging   PET scan showed postradiation changes in the right supra hilar lung without evidence of local recurrence.  No evidence of lung cancer recurrence or metastatic disease.  Single focus of intense metabolic activity associated with  posterior left 11th rib likely secondary to recent fall/posttraumatic metabolic changes.  Frontal  uptake in the left frontal lobe corresponding to the known metastatic lesion in the brain   10/29/2021 -  Radiation Therapy   Brain Radiation [facility]   12/10/2021 Imaging   Brain MRI: Continued interval decrease in size of a left frontal lobe metastasis, now measuring 10 mm. Moderate surrounding edema has also decreased.  3-4 mm metastasis within the right occipital lobe, not significantly changed in size. There is now a small focus of central non enhancement within this lesion. Increased edema. 3 mm enhancing metastasis within the high left parietal lobe. In retrospect, this was likely present as a subtle punctate focus of enhancement on the prior brain MRI of 10/22/2021.   01/18/2022 Imaging   CT chest abdomen pelvis w contrast 1. Stable post treatment changes in the right upper lobe medially with dense radiation fibrosis but no findings to suggest local recurrent tumor. 2. Significant progression of ill-defined ground-glass opacity, interstitial thickening and airspace nodularity in the right upper lobe and upper aspect of the right lower lobe. Findings could reflect an inflammatory or atypical infectious process,  drug induced pneumonitis or interstitial spread of tumor.3. No mediastinal or hilar mass or adenopathy. 4. No findings for abdominal/pelvic metastatic disease or osseous metastatic disease.   02/19/2022 Imaging   MRI brain w wo contrast showed Three metastatic deposits in the brain appear smaller. Decreased edema in the left parietal lobe. No new lesions     Today he feels well. No new complaints. Chronic SOB, stable. No cough. Intermittent headache, is better. denies any focal deficits. He has self stopped prednisone at dosage of $RemoveB'20mg'xJGtgUwk$  for a few days, feels well. He has gained weight.   Review of Systems  Constitutional:  Positive for fatigue. Negative for appetite change, chills, fever and  unexpected weight change.  HENT:   Negative for hearing loss and voice change.   Eyes:  Negative for eye problems and icterus.  Respiratory:  Negative for chest tightness, cough and shortness of breath.   Cardiovascular:  Negative for chest pain and leg swelling.  Gastrointestinal:  Negative for abdominal distention and abdominal pain.  Endocrine: Negative for hot flashes.  Genitourinary:  Negative for difficulty urinating, dysuria and frequency.   Musculoskeletal:  Negative for arthralgias.  Skin:  Negative for itching and rash.  Neurological:  Positive for headaches. Negative for light-headedness and numbness.  Hematological:  Negative for adenopathy. Does not bruise/bleed easily.  Psychiatric/Behavioral:  Negative for confusion.       MEDICAL HISTORY:  Past Medical History:  Diagnosis Date   Allergy    Cancer ([place])    lung   Dyspnea    former smoker. only doe   History of kidney stones    Hypertension    Peripheral vascular disease ([place])    Pre-diabetes    Sleep apnea     SURGICAL HISTORY: Past Surgical History:  Procedure Laterality Date   ANTERIOR CERVICAL DECOMP/DISCECTOMY FUSION N/A 03/14/2020   Procedure: ANTERIOR CERVICAL DECOMPRESSION/DISCECTOMY FUSION 3 LEVELS C4-7;  Surgeon: Meade Maw, MD;  Location: [facility] ORS;  Service: Neurosurgery;  Laterality: N/A;   BACK SURGERY  2011   CENTRAL LINE INSERTION Right 09/10/2016   Procedure: CENTRAL LINE INSERTION;  Surgeon: Katha Cabal, MD;  Location: [facility] ORS;  Service: Vascular;  Laterality: Right;   CERVICAL FUSION     C4-c7 on Mar 14, 2020   COLONOSCOPY  2013 ?   COLONOSCOPY N/A 09/09/2021   Procedure: COLONOSCOPY;  Surgeon: [name], DO;  Location: [facility] ENDOSCOPY;  Service: Gastroenterology;  Laterality: N/A;   CORONARY ANGIOPLASTY     ESOPHAGOGASTRODUODENOSCOPY N/A 09/09/2021   Procedure: ESOPHAGOGASTRODUODENOSCOPY (EGD);  Surgeon: [name], DO;  Location: [facility] END[facility]COPY;   Service: Gastroenterology;  Laterality: N/A;   FEMORAL-POPLITEAL BYPASS GRAFT Right 09/10/2016   Procedure: BYPASS GRAFT FEMORAL-POPLITEAL ARTERY ( BELOW KNEE );  Surgeon: Katha Cabal, MD;  Location: [facility] ORS;  Service: Vascular;  Laterality: Right;   FEMORAL-TIBIAL BYPASS GRAFT Right 03/23/2020   Procedure: BYPASS GRAFT RIGHT FEMORAL- DISTAL TIBIAL ARTERY WITH DISTAL FLOW GRAFT;  Surgeon: Katha Cabal, MD;  Location: [facility] ORS;  Service: Vascular;  Laterality: Right;   LEFT HEART CATH AND CORONARY ANGIOGRAPHY Left 05/16/2021   Procedure: LEFT HEART CATH AND CORONARY ANGIOGRAPHY;  Surgeon: Andrez Grime, MD;  Location: [place] CV LAB;  Service: Cardiovascular;  Laterality: Left;   LOWER EXTREMITY ANGIOGRAPHY Right 11/18/2016   Procedure: Lower Extremity Angiography;  Surgeon: Katha Cabal, MD;  Location: [place] CV LAB;  Service: Cardiovascular;  Laterality: Right;   LOWER EXTREMITY ANGIOGRAPHY Right 12/09/2016  Procedure: Lower Extremity Angiography;  Surgeon: Katha Cabal, MD;  Location: [place] CV LAB;  Service: Cardiovascular;  Laterality: Right;   LOWER EXTREMITY ANGIOGRAPHY Right 11/15/2019   Procedure: LOWER EXTREMITY ANGIOGRAPHY;  Surgeon: Katha Cabal, MD;  Location: [place] CV LAB;  Service: Cardiovascular;  Laterality: Right;   LOWER EXTREMITY ANGIOGRAPHY Right 12/01/2019   Procedure: Lower Extremity Angiography;  Surgeon: Algernon Huxley, MD;  Location: [place] CV LAB;  Service: Cardiovascular;  Laterality: Right;   LOWER EXTREMITY ANGIOGRAPHY Right 12/02/2019   Procedure: LOWER EXTREMITY ANGIOGRAPHY;  Surgeon: Katha Cabal, MD;  Location: [place] CV LAB;  Service: Cardiovascular;  Laterality: Right;   LOWER EXTREMITY ANGIOGRAPHY Right 03/23/2020   Procedure: Lower Extremity Angiography;  Surgeon: Katha Cabal, MD;  Location: [place] CV LAB;  Service: Cardiovascular;  Laterality: Right;   LOWER EXTREMITY  ANGIOGRAPHY Right 09/25/2021   Procedure: Lower Extremity Angiography;  Surgeon: Katha Cabal, MD;  Location: [place] [facility] LAB;  Service: Cardiovascular;  Laterality: Right;   LOWER EXTREMITY ANGIOGRAPHY Right 09/26/2021   Procedure: Lower Extremity Angiography;  Surgeon: Algernon Huxley, MD;  Location: [place] CV LAB;  Service: Cardiovascular;  Laterality: Right;   LOWER EXTREMITY INTERVENTION  11/18/2016   Procedure: Lower Extremity Intervention;  Surgeon: Katha Cabal, MD;  Location: [place] CV LAB;  Service: Cardiovascular;;   PERIPHERAL VASCULAR CATHETERIZATION Right 01/02/2015   Procedure: Lower Extremity Angiography;  Surgeon: Katha Cabal, MD;  Location: [place] CV LAB;  Service: Cardiovascular;  Laterality: Right;   PERIPHERAL VASCULAR CATHETERIZATION Right 06/18/2015   Procedure: Lower Extremity Angiography;  Surgeon: Algernon Huxley, MD;  Location: [place] CV LAB;  Service: Cardiovascular;  Laterality: Right;   PERIPHERAL VASCULAR CATHETERIZATION  06/18/2015   Procedure: Lower Extremity Intervention;  Surgeon: Algernon Huxley, MD;  Location: [place] [facility] LAB;  Service: Cardiovascular;;   PERIPHERAL VASCULAR CATHETERIZATION N/A 10/09/2015   Procedure: Abdominal Aortogram w/Lower Extremity;  Surgeon: Katha Cabal, MD;  Location: [place] CV LAB;  Service: Cardiovascular;  Laterality: N/A;   PERIPHERAL VASCULAR CATHETERIZATION  10/09/2015   Procedure: Lower Extremity Intervention;  Surgeon: Katha Cabal, MD;  Location: [place] CV LAB;  Service: Cardiovascular;;   PERIPHERAL VASCULAR CATHETERIZATION Right 10/10/2015   Procedure: Lower Extremity Angiography;  Surgeon: Algernon Huxley, MD;  Location: [place] CV LAB;  Service: Cardiovascular;  Laterality: Right;   PERIPHERAL VASCULAR CATHETERIZATION Right 10/10/2015   Procedure: Lower Extremity Intervention;  Surgeon: Algernon Huxley, MD;  Location: [place] CV LAB;  Service: Cardiovascular;   Laterality: Right;   PERIPHERAL VASCULAR CATHETERIZATION Right 12/03/2015   Procedure: Lower Extremity Angiography;  Surgeon: Algernon Huxley, MD;  Location: [place] CV LAB;  Service: Cardiovascular;  Laterality: Right;   PERIPHERAL VASCULAR CATHETERIZATION Right 12/04/2015   Procedure: Lower Extremity Angiography;  Surgeon: Algernon Huxley, MD;  Location: [place] [facility] LAB;  Service: Cardiovascular;  Laterality: Right;   PERIPHERAL VASCULAR CATHETERIZATION  12/04/2015   Procedure: Lower Extremity Intervention;  Surgeon: Algernon Huxley, MD;  Location: [place] [facility];  Service: Cardiovascular;;   PERIPHERAL VASCULAR CATHETERIZATION Right 06/30/2016   Procedure: Lower Extremity Angiography;  Surgeon: Algernon Huxley, MD;  Location: [place] [facility];  Service: Cardiovascular;  Laterality: Right;   PERIPHERAL VASCULAR CATHETERIZATION Right 06/25/2016   Procedure: Lower Extremity Angiography;  Surgeon: Katha Cabal, MD;  Location: [place] [facility] LAB;  Service: Cardiovascular;  Laterality: Right;   PERIPHERAL VASCULAR CATHETERIZATION Right 07/01/2016   Procedure: Lower Extremity Angiography;  Surgeon: Katha Cabal, MD;  Location: [place] [facility] LAB;  Service: Cardiovascular;  Laterality: Right;   PERIPHERAL VASCULAR CATHETERIZATION  07/01/2016   Procedure: Lower Extremity Intervention;  Surgeon: Katha Cabal, MD;  Location: [place] [facility] LAB;  Service: Cardiovascular;;   PERIPHERAL VASCULAR CATHETERIZATION Right 08/01/2016   Procedure: Lower Extremity Angiography;  Surgeon: Katha Cabal, MD;  Location: [place] [facility] LAB;  Service: Cardiovascular;  Laterality: Right;   PORTA CATH INSERTION N/A 10/05/2020   Procedure: PORTA CATH INSERTION;  Surgeon: Katha Cabal, MD;  Location: [place] [facility] LAB;  Service: Cardiovascular;  Laterality: N/A;   stent placement in right leg Right    VIDEO BRONCHOSCOPY N/A 09/14/2020   Procedure: VIDEO BRONCHOSCOPY WITH FLUORO;  Surgeon:  Tyler Pita, MD;  Location: [facility] ORS;  Service: Cardiopulmonary;  Laterality: N/A;   VIDEO BRONCHOSCOPY WITH ENDOBRONCHIAL ULTRASOUND N/A 09/14/2020   Procedure: VIDEO BRONCHOSCOPY WITH ENDOBRONCHIAL ULTRASOUND;  Surgeon: Tyler Pita, MD;  Location: [facility] ORS;  Service: Cardiopulmonary;  Laterality: N/A;    SOCIAL HISTORY: Social History   Socioeconomic History   Marital status: Single    Spouse name: Not on file   Number of children: Not on file   Years of education: Not on file   Highest education level: Not on file  Occupational History   Occupation: unemployed  Tobacco Use   Smoking status: Former    Packs/day: 2.00    Years: 38.00    Total pack years: 76.00    Types: Cigarettes    Quit date: 12/20/2014    Years since quitting: 7.2   Smokeless tobacco: Never   Tobacco comments:    quit smoking in 2017 most smoked 2   Vaping Use   Vaping Use: Former  Substance and Sexual Activity   Alcohol use: Not Currently   Drug use: No   Sexual activity: Yes  Other Topics Concern   Not on file  Social History Narrative   Not on file   Social Determinants of Health   Financial Resource Strain: Not on file  Food Insecurity: Not on file  Transportation Needs: Not on file  Physical Activity: Not on file  Stress: Not on file  Social Connections: Not on file  Intimate Partner Violence: Not on file    FAMILY HISTORY: Family History  Problem Relation Age of Onset   Diabetes Mother    Hypertension Mother    Heart murmur Mother    Leukemia Mother    Throat cancer Maternal Grandmother    Colon cancer Neg Hx    Breast cancer Neg Hx     ALLERGIES:  has no active allergies.  MEDICATIONS:  Current Outpatient Medications  Medication Sig Dispense Refill   acetaminophen (TYLENOL) 325 MG tablet Take 650 mg by mouth every 6 (six) hours as needed for moderate pain.     clopidogrel (PLAVIX) 75 MG tablet TAKE 1 TABLET BY MOUTH  DAILY 100 tablet 2   ELIQUIS 5 MG TABS  tablet TAKE 1 TABLET BY MOUTH  TWICE DAILY 120 tablet 5   levETIRAcetam (KEPPRA) 500 MG tablet Take 1 tablet (500 mg total) by mouth 2 (two) times daily. 180 tablet 3   levothyroxine (SYNTHROID) 88 MCG tablet Take 1 tablet (88 mcg total) by mouth daily before breakfast. 30 tablet 2   omeprazole (PRILOSEC) 40 MG capsule Take 1 capsule (40 mg total)  by mouth daily. 90 capsule 1   predniSONE (DELTASONE) 20 MG tablet Take 3 tablets (60 mg total) by mouth See admin instructions. Take 2 tablets daily for 2 weeks, followed by 1 tablet daily for 1 week. 90 tablet 0   simvastatin (ZOCOR) 40 MG tablet Take 40 mg by mouth at bedtime.     Tiotropium Bromide-Olodaterol (STIOLTO RESPIMAT) 2.5-2.5 MCG/ACT AERS Inhale 2 puffs into the lungs daily. 12 g 3   No current facility-administered medications for this visit.     PHYSICAL EXAMINATION: ECOG PERFORMANCE STATUS: 1 - Symptomatic but completely ambulatory Vitals:   03/05/22 1431  BP: 130/81  Pulse: (!) 108  Temp: 99.1 F (37.3 C)   Filed Weights   03/05/22 1431  Weight: 215 lb (97.5 kg)    Physical Exam Constitutional:      General: He is not in acute distress.    Appearance: He is not diaphoretic.     Comments: Patient walks with a cane  HENT:     Head: Normocephalic and atraumatic.     Nose: Nose Joshua.     Mouth/Throat:     Pharynx: No oropharyngeal exudate.  Eyes:     General: No scleral icterus.    Pupils: Pupils are equal, round, and reactive to light.  Cardiovascular:     Rate and Rhythm: Joshua rate and regular rhythm.     Heart sounds: No murmur heard. Pulmonary:     Effort: Pulmonary effort is Joshua. No respiratory distress.     Breath sounds: No rales.  Chest:     Chest wall: No tenderness.  Abdominal:     General: There is no distension.     Palpations: Abdomen is soft.     Tenderness: There is no abdominal tenderness.  Musculoskeletal:        General: Joshua range of motion.     Cervical back: Joshua range of  motion and neck supple.  Skin:    General: Skin is warm and dry.     Findings: No erythema.  Neurological:     Mental Status: He is alert and oriented to person, place, and time.     Cranial Nerves: No cranial nerve deficit.     Motor: No abnormal muscle tone.     Coordination: Coordination Joshua.  Psychiatric:        Mood and Affect: Affect Joshua.      LABORATORY DATA:  I have reviewed the data as listed    Latest Ref Rng & Units 03/05/2022    2:12 PM 02/12/2022    9:59 AM 01/28/2022   11:02 AM  CBC  WBC 4.0 - 10.5 K/uL 5.3  12.9  13.2   Hemoglobin 13.0 - 17.0 g/dL 12.5  13.1  11.8   Hematocrit 39.0 - 52.0 % 40.1  40.5  36.9   Platelets 150 - 400 K/uL 140  203  274       Latest Ref Rng & Units 03/05/2022    2:12 PM 02/12/2022    9:59 AM 01/28/2022   11:02 AM  CMP  Glucose 70 - 99 mg/dL 76  118  142   BUN 8 - 23 mg/dL $Remove'20  28  21   'rdLBCrp$ Creatinine 0.61 - 1.24 mg/dL 1.10  1.04  1.16   Sodium 135 - 145 mmol/L 142  138  141   Potassium 3.5 - 5.1 mmol/L 4.1  4.2  4.0   Chloride 98 - 111 mmol/L 105  103  106  CO2 22 - 32 mmol/L $RemoveB'30  28  26   'gnROcgpz$ Calcium 8.9 - 10.3 mg/dL 9.2  9.4  9.1   Total Protein 6.5 - 8.1 g/dL 7.0  7.4  7.1   Total Bilirubin 0.3 - 1.2 mg/dL 0.6  0.7  0.5   Alkaline Phos 38 - 126 U/L 47  53  64   AST 15 - 41 U/L $Remo'30  26  23   'ovaRK$ ALT 0 - 44 U/L 43  34  18      RADIOGRAPHIC STUDIES: I have personally reviewed the radiological images as listed and agreed with the findings in the report. MR Brain W Wo Contrast  Result Date: 02/19/2022 CLINICAL DATA:  Lung cancer with metastatic disease. Assess response to treatment. EXAM: MRI HEAD WITHOUT AND WITH CONTRAST TECHNIQUE: Multiplanar, multiecho pulse sequences of the brain and surrounding structures were obtained without and with intravenous contrast. CONTRAST:  7mL GADAVIST GADOBUTROL 1 MMOL/ML IV SOLN COMPARISON:  MRI head 12/10/2021 FINDINGS: Brain: 6 mm enhancing lesion left frontal parietal lobe has improved. Mild amount  of surrounding edema has improved. Subtle enhancing lesion in the high left parietal lobe, 18/157 appears smaller and shows minimal enhancement. Enhancing lesion right occipital lobe measures approximately 3 mm and is smaller. No new lesions identified. No acute infarct. Patchy white matter hyperintensities bilaterally consistent with chronic microvascular ischemia. Negative for hemorrhage or hydrocephalus. Vascular: Joshua arterial flow voids at the skull base. Skull and upper cervical spine: Negative Sinuses/Orbits: Mild mucosal edema paranasal sinuses. 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 Decreased edema in the left parietal lobe. No new lesions. Electronically Signed   By: Franchot Gallo M.D.   On: 02/19/2022 18:09   DG Chest 2 View  Result Date: 02/11/2022 CLINICAL DATA:  History of pneumonitis.  Follow-up. EXAM: CHEST - 2 VIEW COMPARISON:  CT C AP January 17, 2022; chest radiograph January 14, 2021 FINDINGS: Right anterior chest wall Port-A-Cath is present with tip projecting over the superior vena cava. Redemonstrated right suprahilar masslike opacity. Scattered heterogeneous opacities right mid and lower lung. No pleural effusion or pneumothorax. Thoracic spine degenerative changes. IMPRESSION: Redemonstrated right mid and lower lung airspace opacities which may represent infectious process in the appropriate clinical setting. Alternative etiologies such as interstitial spread of tumor not excluded. Consider short-term follow-up chest CT. Electronically Signed   By: Lovey Newcomer M.D.   On: 02/11/2022 11:00   CT CHEST ABDOMEN PELVIS W CONTRAST  Result Date: 01/18/2022 CLINICAL DATA:  Restaging metastatic lung cancer. EXAM: CT CHEST, ABDOMEN, AND PELVIS WITH CONTRAST TECHNIQUE: Multidetector CT imaging of the chest, abdomen and pelvis was performed following the standard protocol during bolus administration of intravenous contrast. RADIATION DOSE  REDUCTION: This exam was performed according to the departmental dose-optimization program which includes automated exposure control, adjustment of the mA and/or kV according to patient size and/or use of iterative reconstruction technique. CONTRAST:  147mL OMNIPAQUE IOHEXOL 300 MG/ML  SOLN COMPARISON:  PET-CT 10/16/2021 and prior chest CT 08/22/2021 FINDINGS: CT CHEST FINDINGS Cardiovascular: The heart is Joshua in size. No pericardial effusion. The aorta is Joshua in caliber. No dissection. The branch vessels are patent. Stable right IJ power port. Scattered coronary artery calcifications are stable. Mediastinum/Nodes: No mediastinal or hilar mass or lymphadenopathy. The esophagus is grossly Joshua. 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 I do not see any progressive soft tissue thickening to suggest recurrent tumor.  However, there is significant progression of ill-defined ground-glass opacity, interstitial thickening and airspace nodularity in the right upper lobe and also in the upper aspect of the right lower lobe. Findings could reflect an inflammatory or infectious process but interstitial spread of tumor is also possible. Drug induced pneumonitis in combination with radiation pneumonitis would be another possibility. The left lung remains relatively clear. No pleural effusions or pleural lesions. Musculoskeletal: No chest wall mass, supraclavicular or axillary adenopathy. The bony thorax is intact. CT ABDOMEN PELVIS FINDINGS Hepatobiliary: No hepatic lesions to suggest metastatic disease. No intrahepatic biliary dilatation. The gallbladder is unremarkable. No common bile duct dilatation. Pancreas: No mass, inflammation or ductal dilatation. Spleen: Joshua size. No focal lesions. Adrenals/Urinary Tract: Adrenal glands and kidneys are unremarkable. The bladder is Joshua. Stomach/Bowel: The stomach, duodenum, small bowel and colon are  unremarkable. No acute inflammatory process, mass lesion or obstructive finding. The terminal ileum and appendix are Joshua. There is a metallic clip in the cecum possibly related to a prior colonoscopy and biopsy. Stable significant age advanced sigmoid colon diverticulosis. Vascular/Lymphatic: Scattered atherosclerotic calcifications involving the aorta and iliac arteries. Right femoral artery stents are noted. No mesenteric or retroperitoneal mass or adenopathy. Reproductive: The prostate gland and seminal vesicles are unremarkable. Other: No pelvic mass or adenopathy. No free pelvic fluid collections. No inguinal mass or adenopathy. No abdominal wall hernia or subcutaneous lesions. Musculoskeletal: No significant bony findings. No lytic or sclerotic bone lesions to suggest metastatic disease. Stable lumbar fusion hardware at L5-S1. IMPRESSION: 1. Stable post treatment changes in the right upper lobe medially with dense radiation fibrosis but no findings to suggest local recurrent tumor. 2. Significant progression of ill-defined ground-glass opacity, interstitial thickening and airspace nodularity in the right upper lobe and upper aspect of the right lower lobe. Findings could reflect an inflammatory or atypical infectious process, drug induced pneumonitis or interstitial spread of tumor. 3. No mediastinal or hilar mass or adenopathy. 4. No findings for abdominal/pelvic metastatic disease or osseous metastatic disease. * Tracking Code: BO * Aortic Atherosclerosis (ICD10-I70.0). Electronically Signed   By: Rudie Meyer M.D.   On: 01/18/2022 14:56   VAS [place] LOWER EXTREMITY ARTERIAL DUPLEX  Result Date: 12/18/2021 LOWER EXTREMITY ARTERIAL DUPLEX STUDY Patient Name:  Joshua [name]  Date of Exam:   12/11/2021 Medical Rec #: [id number]            Accession #:    [id number] Date of Birth: 22-Mar-1956           Patient Gender: M Patient Age:   53 years Exam Location:  [facility] Procedure:      VAS  [place] LOWER EXTREMITY ARTERIAL DUPLEX Referring Phys: Sheppard Plumber --------------------------------------------------------------------------------  Indications: Peripheral artery disease, and pain post workout. Other Factors: ASO s/p multiple interventions and bypass.                11/15/2019 Right thrombectomy of Fempop insitu graft with added                stent throughout.                 12/01/2019: Aortogram and Selctive Right Lower Extremity                Angiogram. 6 mg of TPA delivered in a lysis catheterfrom the                Origin of the Right Femoral -Popliteal bypass down the proximal  ATA with placement of the lysis.                 12/02/2019: PTA of the Right Posterior Tibial Artery. PTA of the                Right Peroneal Artery. Mechanical Thrombectomy of the Right                Femoral -Popliteal Bypass graft. Mechanical Thrombectomy of the                Right Tibioperoneal Trunk and Posterior Tibial Artery.                 09/25/2021: Right Lower Extremity Angiography third order                catheter placement. PTA of the proximal peroneal and                tibioperoneal trunk to 2.5 mm with an ultra score balloon. PTA of                the Femoral Artery and the proximal anstomosis of the bypass                graft to 4 mm with an ultra score balloon. Administration of 5 mg                of tpa throughout the entire bypass and in the peroneal Artery.                Initiation of continuous tpa infusion Right lower Extremity for                limb salvage. Insertion of a triple lumen catheter Left CFV with                [place] and fluoroscopic guidance.                 09/26/2021: Right LE Angiogram. PTA of the Right Posterior Tibial                Artery with 2.5 mm diameter by 30 cm length angioplasty balloon.                PTA of the distal bypass anastomosis and distal bypass graft                analogous to the Popliteal artery with 4 mm diameter by 8 cm                 length Lutonix drug coated angioplasty balloon.  Vascular Interventions: Hx of multiple right lower extremity PTA's/stents.                         09/10/2016 Right femoral to below the knee popliteal                         artery bypass graft placement. 5/01 Right bypass graft &                         mid popliteal artery PTA & coil embolectomy of large                         branch of bypass graft. 12/09/16 Right distal bypass  graft PTA/stent and right posterior tibial artery PTA.                         03/23/2020 Rt PTA thrombectomy. Rt Profunda fem artery to                         PTA bypass. Current ABI:            n/a Performing Technologist: Concha Norway RVT  Examination Guidelines: A complete evaluation includes B-mode imaging, spectral Doppler, color Doppler, and power Doppler as needed of all accessible portions of each vessel. Bilateral testing is considered an integral part of a complete examination. Limited examinations for reoccurring indications may be performed as noted.  +-----------+--------+-----+--------+----------+--------+ RIGHT      PSV cm/sRatioStenosisWaveform  Comments +-----------+--------+-----+--------+----------+--------+ CFA Mid    57                                      +-----------+--------+-----+--------+----------+--------+ DFA        40                                      +-----------+--------+-----+--------+----------+--------+ ATA Distal 105                  monophasic         +-----------+--------+-----+--------+----------+--------+ PTA Distal 11                   biphasic           +-----------+--------+-----+--------+----------+--------+ PERO Distal11                   monophasic         +-----------+--------+-----+--------+----------+--------+  Right Graft #1: fempta +------------------+--------+--------+--------+--------+                   PSV cm/sStenosisWaveformComments  +------------------+--------+--------+--------+--------+ Inflow            57                               +------------------+--------+--------+--------+--------+ Prox Anastomosis  29                               +------------------+--------+--------+--------+--------+ Proximal Graft    37                               +------------------+--------+--------+--------+--------+ Mid Graft         30                               +------------------+--------+--------+--------+--------+ Distal Graft      27                               +------------------+--------+--------+--------+--------+ Distal Anastomosis37                               +------------------+--------+--------+--------+--------+ Outflow           50                               +------------------+--------+--------+--------+--------+  Summary: Right: Patent BPG with no evidence of stenosis.  See table(s) above for measurements and observations. Electronically signed by Leotis Pain MD on 12/18/2021 at 9:15:55 AM.    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 EXAM: MRI HEAD WITHOUT AND WITH CONTRAST TECHNIQUE: Multiplanar, multiecho pulse sequences of the brain and surrounding structures were obtained without and with intravenous contrast. CONTRAST:  57mL GADAVIST GADOBUTROL 1 MMOL/ML IV SOLN COMPARISON:  Prior brain MRI examinations 10/22/2021 and earlier. FINDINGS: Brain: No age advanced or lobar predominant parenchymal atrophy. Continued interval decrease in size of an enhancing metastasis the subcortical white matter of the posterolateral left frontal lobe, now measuring 10 mm (previously measuring 12 mm) (series 18, image 107). Moderate surrounding vasogenic edema has also decreased. A 3-4 mm enhancing lesion within the right occipital lobe has not significantly changed in size. There is a central  focus of non enhancement within this lesion, which is new from the prior MRI of 10/22/2021. Mild surrounding edema has slightly increased. 3 mm enhancing metastasis within the high left parietal lobe (series 18, image 146). In retrospect, this was likely present as a subtle punctate focus of enhancement on the prior brain MRI of 10/22/2021. Unchanged punctate enhancing focus within the left cerebellar hemisphere, favored to reflect vascular enhancement (series 18, image 35). Background mild-to-moderate chronic small vessel ischemic changes within the cerebral white matter, stable. Redemonstrated tiny T2 hyperintense focus within the right cerebellar hemisphere, which may reflect a chronic lacunar infarct (series 10, image 7). There is no acute infarct No chronic intracranial blood products. No extra-axial fluid collection. No midline shift. Vascular: Maintained flow voids within the proximal large arterial vessels. Skull and upper cervical spine: No focal suspicious marrow lesion. Sinuses/Orbits: No mass or acute finding within the imaged orbits. Mild mucosal thickening within the bilateral ethmoid and maxillary sinuses. Superimposed small mucous retention cyst within the right maxillary sinus. Other: Trace fluid within the left mastoid air cells. Known left parotid mass, incompletely imaged. Impression #3 will be called to the ordering clinician or representative by the Radiologist Assistant, and communication documented in the PACS or [employer]. IMPRESSION: Since the prior brain MRI of 10/22/2021, there has been a continued interval decrease in size of a left frontal lobe metastasis, now measuring 10 mm. Moderate surrounding edema has also decreased. 3-4 mm metastasis within the right occipital lobe, not significantly changed in size. There is now a small focus of central non enhancement within this lesion. Surrounding edema has slightly increased, possibly treatment-related. Attention recommended on  imaging follow-up. 3 mm enhancing metastasis within the high left parietal lobe. In retrospect, this was likely present as a subtle punctate focus of enhancement on the prior brain MRI of 10/22/2021. Unchanged punctate focus of enhancement within the left cerebellar hemisphere, which is favored vascular. Attention recommended on follow-up. 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 Electronically Signed   By: Kellie Simmering D.O.   On: 12/10/2021 14:51

## 2022-03-06 NOTE — Progress Notes (Signed)
Subjective:    Patient ID: Joshua [name], male    DOB: 1956/05/11, 66 y.o.   MRN: [id number] Patient Care Team: [name], MD as PCP - General (Physician Assistant) [name], MD as Referring Physician (Physician Assistant) Bary Castilla, [name], MD (General Surgery) Telford Nab, RN as Oncology Nurse Navigator [name], MD as Consulting Physician (Oncology)  Chief Complaint  Patient presents with   Follow-up    SOB with exertion and dry cough.    HPI Patient is a 66 year old former smoker (37 PY) with a history of COPD, shortness of breath on exertion and squamous cell carcinoma of the right upper lobe, stage IIIa disease.  The patient received concomitant chemoradiation followed by durvalumab maintenance as he is PD-L1 positive.  At his last visit on 04 Dec 2021 he was instructed to resume Stiolto which was helping him somewhat with his shortness of breath but he had discontinued due to cost.  He is intolerant of inhaled corticosteroids due to untoward side effects of hoarseness.  Recall that on 25 September 2021 he was noted to have ischemia of the right lower extremity at the site of previous bypass grafts and interventions.  He required thrombectomy and angioplasty and tPA infusion.  He was discharged on 10 March after that intervention.  Readmitted to the hospital on 20 March due to weakness on his right side and neck pain.  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 He has received radiation therapy for this.  Since then, he has not had any other new issue.   He presents today for follow-up on shortness of breath issues.  He is taking Stiolto.  Previously he was noted to be irritable and forgetful after radiation to the brain however he seems better today.  He does not endorse any other symptomatology.  He has had no cough or sputum production.  No lower extremity edema or calf tenderness since his episode as noted  above.  Has not had any fevers, chills or sweats since his last visit.  Continues to follow-up with oncology, saw Dr. Tasia Catchings yesterday.  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  He was receiving Durvalumab but there is a question of potential pneumonitis on CT chest and this is being held.  He is on Eliquis and Plavix for his severe peripheral artery disease status post thrombosis of the right lower extremity.  I have reviewed independently the CT performed 17 January 2022 showing potential pneumonitis in the right upper lobe and upper aspect of the right lower lobe.  He is to have follow-up CT in 3 months, Durvalumab being held.  DATA 07/18/2021 PFTs: FEV1 2.80 L or 76% predicted, FVC 3.61 L or 74% predicted, there is a small airways component.  No significant bronchodilator response.  Lung volumes mildly reduced.  Diffusion capacity 71% by Kco.  Review of Systems A 10 point review of systems was performed and it is as noted above otherwise negative.  Patient Active Problem List   Diagnosis Date Noted   GERD (gastroesophageal reflux disease) 01/25/2022   Pneumonitis 01/25/2022   COPD (chronic obstructive pulmonary disease) ([place]) 12/28/2021   Hypocalcemia 12/28/2021   Malignant neoplasm metastatic to brain ([place]) 10/08/2021   Hypothyroidism, unspecified 10/08/2021   Parotid nodule 08/16/2021   Dysphagia 06/20/2021   COVID 06/20/2021   Organizing pneumonia ([place]) 04/04/2021   Hypothyroidism due to medication 04/04/2021   Encounter for antineoplastic immunotherapy  04/04/2021   Antineoplastic chemotherapy induced anemia 10/17/2020   Squamous cell carcinoma of right lung ([place]) 09/20/2020   Goals of care, counseling/discussion 08/30/2020   Iron deficiency anemia 08/30/2020   Ischemia of extremity 03/23/2020   Cervical myelopathy ([place]) 03/14/2020   Ischemia of right lower extremity 11/15/2019   Peripheral neuropathy 10/14/2019   Chronic left-sided low back pain without sciatica  04/13/2019   History of back surgery 04/13/2019   Atherosclerosis of lower extremity with claudication ([place]) [id number]   Complication associated with vascular device 11/13/2016   Hyperlipidemia, unspecified 06/18/2016   Radiculopathy of lumbar region 06/10/2016   PVD (peripheral vascular disease) ([place]) 05/08/2016   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   clopidogrel (PLAVIX) 75 MG tablet TAKE 1 TABLET BY MOUTH  DAILY   ELIQUIS 5 MG TABS tablet TAKE 1 TABLET BY MOUTH  TWICE DAILY   levETIRAcetam (KEPPRA) 500 MG tablet Take 1 tablet (500 mg total) by mouth 2 (two) times daily.   levothyroxine (SYNTHROID) 88 MCG tablet Take 1 tablet (88 mcg total) by mouth daily before breakfast.   omeprazole (PRILOSEC) 40 MG capsule Take 1 capsule (40 mg total) by mouth daily.   predniSONE (DELTASONE) 20 MG tablet Take 3 tablets (60 mg total) by mouth See admin instructions. Take 2 tablets daily for 2 weeks, followed by 1 tablet daily for 1 week.   simvastatin (ZOCOR) 40 MG tablet Take 40 mg by mouth at bedtime.   Tiotropium Bromide-Olodaterol (STIOLTO RESPIMAT) 2.5-2.5 MCG/ACT AERS Inhale 2 puffs into the lungs daily.   Immunization History  Administered Date(s) Administered   Influenza,inj,Quad PF,6+ Mos 09/14/2016, 04/13/2019, 04/16/2020   Influenza-Unspecified 09/14/2016, 04/13/2019, 04/16/2020   PFIZER Comirnaty(Gray Top)Covid-19 Tri-Sucrose  Vaccine 10/20/2019, 11/29/2019   PFIZER(Purple Top)SARS-COV-2 Vaccination 10/20/2019, 11/29/2019   Tdap 04/16/2020       Objective:   Physical Exam BP (!) 142/72 (BP Location: Left Arm, Cuff Size: Normal)   Pulse 100   Temp 98.1 F (36.7 C) (Temporal)   Ht '5\' 11"'$  (1.803 m)   Wt 213 lb (96.6 kg)   SpO2 96%   BMI 29.71 kg/m  GENERAL: Chronically ill-appearing well-developed well-nourished gentleman, no conversational dyspnea.  Ambulatory with assistance of cane.   HEAD: Normocephalic, atraumatic.  EYES: Pupils equal, round, reactive to light.  No scleral icterus.  MOUTH: Oral mucosa moist, no thrush. NECK: Supple.  Limited ROM due to prior spinal fusion.  No thyromegaly. Trachea midline. No JVD.  No adenopathy. PULMONARY: Good air entry bilaterally.  Coarse, otherwise no adventitious sounds. CARDIOVASCULAR: S1 and S2. Regular rate and rhythm.  No rubs, murmurs or gallops heard. ABDOMEN: Benign. MUSCULOSKELETAL: No joint deformity, no clubbing, no edema.  NEUROLOGIC: Mild psychomotor retardation noted, mild unsteady gait. SKIN: Intact,warm,dry. PSYCH: Taciturn.     Assessment & Plan:  ICD-10-CM   1. COPD with chronic bronchitis and emphysema (HCC)  J44.9    Stiolto 2 puffs twice a day Patient declines albuterol    2. Primary malignant neoplasm of lung with metastasis to brain [facility])  C34.90    C79.31    Followed by oncology This issue adds complexity to his management    3. Pneumonitis  J18.9    Related to Durvalumab Medication being held Patient relatively asymptomatic     Will see the patient follow-up in 2 to 3 months time he is to call sooner should any new problems arise.  Renold Don, MD Advanced Bronchoscopy PCCM [facility]   *This note was dictated using voice recognition software/Dragon.  Despite best efforts to proofread, errors can occur which can change the meaning. Any transcriptional errors that result from this process are  unintentional and may not be fully corrected at the time of dictation.

## 2022-03-07 ENCOUNTER — Other Ambulatory Visit (INDEPENDENT_AMBULATORY_CARE_PROVIDER_SITE_OTHER): Payer: Self-pay | Admitting: Vascular Surgery

## 2022-03-07 DIAGNOSIS — Z9889 Other specified postprocedural states: Secondary | ICD-10-CM

## 2022-03-09 NOTE — Progress Notes (Unsigned)
MRN : [id number]  Joshua [name] is a 66 y.o. ([birth year]) male who presents with chief complaint of check circulation.  History of Present Illness:   The patient returns to the office for followup and review status post angiogram with intervention on 09/25/2021 and 09/26/2021.   Procedure:  Percutaneous transluminal angioplasty of the proximal peroneal and tibioperoneal trunk to 2.5 mm with an ultra score balloon 2.    Percutaneous transluminal angioplasty of the femoral artery and the proximal anastomosis of the bypass graft to 4 mm with an ultra score balloon 3.    Administration of 6 mg of tPA throughout the entire bypass and in the peroneal artery 4.     Initiation of continuous tPA infusion right lower extremity for limb salvage   Percutaneous transluminal angioplasty of the right posterior tibial artery with 2.5 mm diameter by 30 cm length angioplasty balloon 2.    Percutaneous transluminal angioplasty of the distal bypass anastomosis and distal bypass graft analogous to the popliteal artery with 4 mm diameter by 8 cm length Lutonix drug-coated angioplasty balloon  The patient notes improvement in the lower extremity symptoms. No interval shortening of the patient's claudication distance or rest pain symptoms. No new ulcers or wounds have occurred since the last visit.  There have been no significant changes to the patient's overall health care.  No documented history of amaurosis fugax or recent TIA symptoms. There are no recent neurological changes noted. No documented history of DVT, PE or superficial thrombophlebitis. The patient denies recent episodes of angina or shortness of breath.   ABI's Rt=*** and Lt=***  (previous ABI's Rt=*** and Lt=***) Duplex US of the *** lower extremity arterial system shows ***   No outpatient medications have been marked as taking for the 03/10/22 encounter (Appointment) with Delana Meyer, Dolores Lory, MD.    Past Medical  History:  Diagnosis Date   Allergy    Cancer [facility])    lung   Dyspnea    former smoker. only doe   History of kidney stones    Hypertension    Peripheral vascular disease ([place])    Pre-diabetes    Sleep apnea     Past Surgical History:  Procedure Laterality Date   ANTERIOR CERVICAL DECOMP/DISCECTOMY FUSION N/A 03/14/2020   Procedure: ANTERIOR CERVICAL DECOMPRESSION/DISCECTOMY FUSION 3 LEVELS C4-7;  Surgeon: Meade Maw, MD;  Location: [facility] ORS;  Service: Neurosurgery;  Laterality: N/A;   BACK SURGERY  2011   CENTRAL LINE INSERTION Right 09/10/2016   Procedure: CENTRAL LINE INSERTION;  Surgeon: Katha Cabal, MD;  Location: [facility] ORS;  Service: Vascular;  Laterality: Right;   CERVICAL FUSION     C4-c7 on Mar 14, 2020   COLONOSCOPY  2013 ?   COLONOSCOPY N/A 09/09/2021   Procedure: COLONOSCOPY;  Surgeon: [name], DO;  Location: [facility] ENDOSCOPY;  Service: Gastroenterology;  Laterality: N/A;   CORONARY ANGIOPLASTY     ESOPHAGOGASTRODUODENOSCOPY N/A 09/09/2021   Procedure: ESOPHAGOGASTRODUODENOSCOPY (EGD);  Surgeon: [name], DO;  Location: [facility] ENDOSCOPY;  Service: Gastroenterology;  Laterality: N/A;   FEMORAL-POPLITEAL BYPASS GRAFT Right 09/10/2016   Procedure: BYPASS GRAFT FEMORAL-POPLITEAL ARTERY ( BELOW KNEE );  Surgeon: Katha Cabal, MD;  Location: [facility] ORS;  Service: Vascular;  Laterality: Right;   FEMORAL-TIBIAL BYPASS GRAFT Right 03/23/2020   Procedure: BYPASS GRAFT RIGHT FEMORAL- DISTAL TIBIAL ARTERY WITH DISTAL FLOW GRAFT;  Surgeon: Katha Cabal,  MD;  Location: [facility] ORS;  Service: Vascular;  Laterality: Right;   LEFT HEART CATH AND CORONARY ANGIOGRAPHY Left 05/16/2021   Procedure: LEFT HEART CATH AND CORONARY ANGIOGRAPHY;  Surgeon: Andrez Grime, MD;  Location: [place] CV LAB;  Service: Cardiovascular;  Laterality: Left;   LOWER EXTREMITY ANGIOGRAPHY Right 11/18/2016   Procedure: Lower Extremity Angiography;  Surgeon: Katha Cabal, MD;  Location: [place] [facility] LAB;  Service: Cardiovascular;  Laterality: Right;   LOWER EXTREMITY ANGIOGRAPHY Right 12/09/2016   Procedure: Lower Extremity Angiography;  Surgeon: Katha Cabal, MD;  Location: [place] [facility] LAB;  Service: Cardiovascular;  Laterality: Right;   LOWER EXTREMITY ANGIOGRAPHY Right 11/15/2019   Procedure: LOWER EXTREMITY ANGIOGRAPHY;  Surgeon: Katha Cabal, MD;  Location: [place] [facility] LAB;  Service: Cardiovascular;  Laterality: Right;   LOWER EXTREMITY ANGIOGRAPHY Right 12/01/2019   Procedure: Lower Extremity Angiography;  Surgeon: Algernon Huxley, MD;  Location: [place] [facility] LAB;  Service: Cardiovascular;  Laterality: Right;   LOWER EXTREMITY ANGIOGRAPHY Right 12/02/2019   Procedure: LOWER EXTREMITY ANGIOGRAPHY;  Surgeon: Katha Cabal, MD;  Location: [place] [facility] LAB;  Service: Cardiovascular;  Laterality: Right;   LOWER EXTREMITY ANGIOGRAPHY Right 03/23/2020   Procedure: Lower Extremity Angiography;  Surgeon: Katha Cabal, MD;  Location: [place] [facility] LAB;  Service: Cardiovascular;  Laterality: Right;   LOWER EXTREMITY ANGIOGRAPHY Right 09/25/2021   Procedure: Lower Extremity Angiography;  Surgeon: Katha Cabal, MD;  Location: [place] [facility] LAB;  Service: Cardiovascular;  Laterality: Right;   LOWER EXTREMITY ANGIOGRAPHY Right 09/26/2021   Procedure: Lower Extremity Angiography;  Surgeon: Algernon Huxley, MD;  Location: [place] [facility] LAB;  Service: Cardiovascular;  Laterality: Right;   LOWER EXTREMITY INTERVENTION  11/18/2016   Procedure: Lower Extremity Intervention;  Surgeon: Katha Cabal, MD;  Location: [place] [facility] LAB;  Service: Cardiovascular;;   PERIPHERAL VASCULAR CATHETERIZATION Right 01/02/2015   Procedure: Lower Extremity Angiography;  Surgeon: Katha Cabal, MD;  Location: [place] [facility] LAB;  Service: Cardiovascular;  Laterality: Right;   PERIPHERAL VASCULAR CATHETERIZATION Right 06/18/2015    Procedure: Lower Extremity Angiography;  Surgeon: Algernon Huxley, MD;  Location: [place] [facility];  Service: Cardiovascular;  Laterality: Right;   PERIPHERAL VASCULAR CATHETERIZATION  06/18/2015   Procedure: Lower Extremity Intervention;  Surgeon: Algernon Huxley, MD;  Location: [place] [facility];  Service: Cardiovascular;;   PERIPHERAL VASCULAR CATHETERIZATION N/A 10/09/2015   Procedure: Abdominal Aortogram w/Lower Extremity;  Surgeon: Katha Cabal, MD;  Location: [place] [facility] LAB;  Service: Cardiovascular;  Laterality: N/A;   PERIPHERAL VASCULAR CATHETERIZATION  10/09/2015   Procedure: Lower Extremity Intervention;  Surgeon: Katha Cabal, MD;  Location: [place] [facility] LAB;  Service: Cardiovascular;;   PERIPHERAL VASCULAR CATHETERIZATION Right 10/10/2015   Procedure: Lower Extremity Angiography;  Surgeon: Algernon Huxley, MD;  Location: [place] [facility];  Service: Cardiovascular;  Laterality: Right;   PERIPHERAL VASCULAR CATHETERIZATION Right 10/10/2015   Procedure: Lower Extremity Intervention;  Surgeon: Algernon Huxley, MD;  Location: [place] [facility];  Service: Cardiovascular;  Laterality: Right;   PERIPHERAL VASCULAR CATHETERIZATION Right 12/03/2015   Procedure: Lower Extremity Angiography;  Surgeon: Algernon Huxley, MD;  Location: [place] [facility] LAB;  Service: Cardiovascular;  Laterality: Right;   PERIPHERAL VASCULAR CATHETERIZATION Right 12/04/2015   Procedure: Lower Extremity Angiography;  Surgeon: Algernon Huxley, MD;  Location: [place] [facility];  Service: Cardiovascular;  Laterality: Right;  PERIPHERAL VASCULAR CATHETERIZATION  12/04/2015   Procedure: Lower Extremity Intervention;  Surgeon: Algernon Huxley, MD;  Location: [place] [facility];  Service: Cardiovascular;;   PERIPHERAL VASCULAR CATHETERIZATION Right 06/30/2016   Procedure: Lower Extremity Angiography;  Surgeon: Algernon Huxley, MD;  Location: [place] [facility];  Service: Cardiovascular;  Laterality: Right;   PERIPHERAL  VASCULAR CATHETERIZATION Right 06/25/2016   Procedure: Lower Extremity Angiography;  Surgeon: Katha Cabal, MD;  Location: [place] [facility] LAB;  Service: Cardiovascular;  Laterality: Right;   PERIPHERAL VASCULAR CATHETERIZATION Right 07/01/2016   Procedure: Lower Extremity Angiography;  Surgeon: Katha Cabal, MD;  Location: [place] [facility];  Service: Cardiovascular;  Laterality: Right;   PERIPHERAL VASCULAR CATHETERIZATION  07/01/2016   Procedure: Lower Extremity Intervention;  Surgeon: Katha Cabal, MD;  Location: [place] [facility];  Service: Cardiovascular;;   PERIPHERAL VASCULAR CATHETERIZATION Right 08/01/2016   Procedure: Lower Extremity Angiography;  Surgeon: Katha Cabal, MD;  Location: [place] [facility];  Service: Cardiovascular;  Laterality: Right;   PORTA CATH INSERTION N/A 10/05/2020   Procedure: PORTA CATH INSERTION;  Surgeon: Katha Cabal, MD;  Location: [place] [facility];  Service: Cardiovascular;  Laterality: N/A;   stent placement in right leg Right    VIDEO BRONCHOSCOPY N/A 09/14/2020   Procedure: VIDEO BRONCHOSCOPY WITH FLUORO;  Surgeon: Tyler Pita, MD;  Location: [facility] ORS;  Service: Cardiopulmonary;  Laterality: N/A;   VIDEO BRONCHOSCOPY WITH ENDOBRONCHIAL ULTRASOUND N/A 09/14/2020   Procedure: VIDEO BRONCHOSCOPY WITH ENDOBRONCHIAL ULTRASOUND;  Surgeon: Tyler Pita, MD;  Location: [facility] ORS;  Service: Cardiopulmonary;  Laterality: N/A;    Social History Social History   Tobacco Use   Smoking status: Former    Packs/day: 2.00    Years: 38.00    Total pack years: 76.00    Types: Cigarettes    Quit date: 12/20/2014    Years since quitting: 7.2   Smokeless tobacco: Never   Tobacco comments:    quit smoking in 2017 most smoked 2   Vaping Use   Vaping Use: Former  Substance Use Topics   Alcohol use: Not Currently   Drug use: No    Family History Family History  Problem Relation Age of Onset   Diabetes Mother     Hypertension Mother    Heart murmur Mother    Leukemia Mother    Throat cancer Maternal Grandmother    Colon cancer Neg Hx    Breast cancer Neg Hx     No Active Allergies   REVIEW OF SYSTEMS (Negative unless checked)  Constitutional: [] Weight loss  [] Fever  [] Chills Cardiac: [] Chest pain   [] Chest pressure   [] Palpitations   [] Shortness of breath when laying flat   [] Shortness of breath with exertion. Vascular:  [x] Pain in legs with walking   [] Pain in legs at rest  [] History of DVT   [] Phlebitis   [] Swelling in legs   [] Varicose veins   [] Non-healing ulcers Pulmonary:   [] Uses home oxygen   [] Productive cough   [] Hemoptysis   [] Wheeze  [] COPD   [] Asthma Neurologic:  [] Dizziness   [] Seizures   [] History of stroke   [] History of TIA  [] Aphasia   [] Vissual changes   [] Weakness or numbness in arm   [] Weakness or numbness in leg Musculoskeletal:   [] Joint swelling   [] Joint pain   [] Low back pain Hematologic:  [] Easy bruising  [] Easy bleeding   [] Hypercoagulable state   [] Anemic Gastrointestinal:  [] Diarrhea   []   Vomiting  [] Gastroesophageal reflux/heartburn   [] Difficulty swallowing. Genitourinary:  [] Chronic kidney disease   [] Difficult urination  [] Frequent urination   [] Blood in urine Skin:  [] Rashes   [] Ulcers  Psychological:  [] History of anxiety   []  History of major depression.  Physical Examination  There were no vitals filed for this visit. There is no height or weight on file to calculate BMI. Gen: WD/WN, NAD Head: [place]/AT, No temporalis wasting.  Ear/Nose/Throat: Hearing grossly intact, nares w/o erythema or drainage Eyes: PER, EOMI, sclera nonicteric.  Neck: Supple, no masses.  No bruit or JVD.  Pulmonary:  Good air movement, no audible wheezing, no use of accessory muscles.  Cardiac: RRR, normal S1, S2, no Murmurs. Vascular:  mild trophic changes, no open wounds Vessel Right Left  Radial Palpable Palpable  PT Not Palpable Not Palpable  DP Not Palpable Not Palpable   Gastrointestinal: soft, non-distended. No guarding/no peritoneal signs.  Musculoskeletal: M/S 5/5 throughout.  No visible deformity.  Neurologic: CN 2-12 intact. Pain and light touch intact in extremities.  Symmetrical.  Speech is fluent. Motor exam as listed above. Psychiatric: Judgment intact, Mood & affect appropriate for pt's clinical situation. Dermatologic: No rashes or ulcers noted.  No changes consistent with cellulitis.   CBC Lab Results  Component Value Date   WBC 5.3 03/05/2022   HGB 12.5 (L) 03/05/2022   HCT 40.1 03/05/2022   MCV 89.5 03/05/2022   PLT 140 (L) 03/05/2022    BMET    Component Value Date/Time   NA 142 03/05/2022 1412   NA 141 10/14/2019 1101   K 4.1 03/05/2022 1412   CL 105 03/05/2022 1412   CO2 30 03/05/2022 1412   GLUCOSE 76 03/05/2022 1412   BUN 20 03/05/2022 1412   BUN 15 10/14/2019 1101   BUN 16 07/03/2014 1205   CREATININE 1.10 03/05/2022 1412   CREATININE 1.34 (H) 07/03/2014 1205   CALCIUM 9.2 03/05/2022 1412   GFRNONAA >60 03/05/2022 1412   GFRNONAA 59 (L) 07/03/2014 1205   GFRAA >60 03/30/2020 0536   GFRAA >60 07/03/2014 1205   Estimated Creatinine Clearance: 81.5 mL/min (by C-G formula based on SCr of 1.1 mg/dL).  COAG Lab Results  Component Value Date   INR 1.2 09/26/2021   INR 1.2 08/25/2020   INR 1.0 03/05/2020    Radiology MR Brain W Wo Contrast  Result Date: 02/19/2022 CLINICAL DATA:  Lung cancer with metastatic disease. Assess response to treatment. EXAM: MRI HEAD WITHOUT AND WITH CONTRAST TECHNIQUE: Multiplanar, multiecho pulse sequences of the brain and surrounding structures were obtained without and with intravenous contrast. CONTRAST:  66mL GADAVIST GADOBUTROL 1 MMOL/ML IV SOLN COMPARISON:  MRI head 12/10/2021 FINDINGS: Brain: 6 mm enhancing lesion left frontal parietal lobe has improved. Mild amount of surrounding edema has improved. Subtle enhancing lesion in the high left parietal lobe, 18/157 appears smaller and  shows minimal enhancement. Enhancing lesion right occipital lobe measures approximately 3 mm and is smaller. No new lesions identified. No acute infarct. Patchy white matter hyperintensities bilaterally consistent with chronic microvascular ischemia. Negative for hemorrhage or hydrocephalus. Vascular: Normal arterial flow voids at the skull base. Skull and upper cervical spine: Negative Sinuses/Orbits: Mild mucosal edema paranasal sinuses. 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 Decreased edema in the left parietal lobe. No new lesions. Electronically Signed   By: Franchot Gallo M.D.   On: 02/19/2022 18:09   DG Chest 2 View  Result Date: 02/11/2022 CLINICAL DATA:  History of pneumonitis.  Follow-up. EXAM: CHEST - 2 VIEW COMPARISON:  CT C AP January 17, 2022; chest radiograph January 14, 2021 FINDINGS: Right anterior chest wall Port-A-Cath is present with tip projecting over the superior vena cava. Redemonstrated right suprahilar masslike opacity. Scattered heterogeneous opacities right mid and lower lung. No pleural effusion or pneumothorax. Thoracic spine degenerative changes. IMPRESSION: Redemonstrated right mid and lower lung airspace opacities which may represent infectious process in the appropriate clinical setting. Alternative etiologies such as interstitial spread of tumor not excluded. Consider short-term follow-up chest CT. Electronically Signed   By: Lovey Newcomer M.D.   On: 02/11/2022 11:00     Assessment/Plan There are no diagnoses linked to this encounter.   [name], MD  03/09/2022 4:04 PM

## 2022-03-10 ENCOUNTER — Ambulatory Visit (INDEPENDENT_AMBULATORY_CARE_PROVIDER_SITE_OTHER): Payer: Medicare Other

## 2022-03-10 ENCOUNTER — Encounter (INDEPENDENT_AMBULATORY_CARE_PROVIDER_SITE_OTHER): Payer: Self-pay | Admitting: Vascular Surgery

## 2022-03-10 ENCOUNTER — Ambulatory Visit (INDEPENDENT_AMBULATORY_CARE_PROVIDER_SITE_OTHER): Payer: Medicare Other | Admitting: Vascular Surgery

## 2022-03-10 VITALS — BP 151/78 | HR 85 | Resp 16 | Wt 213.8 lb

## 2022-03-10 DIAGNOSIS — Z9889 Other specified postprocedural states: Secondary | ICD-10-CM | POA: Diagnosis not present

## 2022-03-10 DIAGNOSIS — I739 Peripheral vascular disease, unspecified: Secondary | ICD-10-CM

## 2022-03-10 DIAGNOSIS — I70219 Atherosclerosis of native arteries of extremities with intermittent claudication, unspecified extremity: Secondary | ICD-10-CM | POA: Diagnosis not present

## 2022-03-10 DIAGNOSIS — J449 Chronic obstructive pulmonary disease, unspecified: Secondary | ICD-10-CM | POA: Diagnosis not present

## 2022-03-10 DIAGNOSIS — E782 Mixed hyperlipidemia: Secondary | ICD-10-CM | POA: Diagnosis not present

## 2022-03-10 DIAGNOSIS — K219 Gastro-esophageal reflux disease without esophagitis: Secondary | ICD-10-CM | POA: Diagnosis not present

## 2022-03-11 ENCOUNTER — Encounter (INDEPENDENT_AMBULATORY_CARE_PROVIDER_SITE_OTHER): Payer: Self-pay | Admitting: Vascular Surgery

## 2022-03-18 ENCOUNTER — Telehealth: Payer: Self-pay | Admitting: Pulmonary Disease

## 2022-03-18 NOTE — Telephone Encounter (Signed)
Spoke to patient. He stated that Stiolto is not affordable with a 149 dollar co pay. Per 02/13/2022 phone note, co pay was 47 dollars.   Pharmacy team, can you guys investigate this? Thanks

## 2022-03-19 ENCOUNTER — Other Ambulatory Visit (HOSPITAL_COMMUNITY): Payer: Self-pay

## 2022-03-19 MED ORDER — BEVESPI AEROSPHERE 9-4.8 MCG/ACT IN AERO: 2.0000 | INHALATION_SPRAY | Freq: Two times a day (BID) | RESPIRATORY_TRACT | 1 refills | Status: DC

## 2022-03-19 NOTE — Telephone Encounter (Signed)
Spoke to patient and relayed below message and voiced his understanding.  He is questioning if he can switch to [place]?  Dr. Patsey Berthold, please advise. Thanks

## 2022-03-19 NOTE — Telephone Encounter (Signed)
Bevespi is reasonable.  2 puffs twice a day.  We can also place for medication assistance forms.

## 2022-03-19 NOTE — Telephone Encounter (Signed)
Spoke to patient and relayed below message/recommendations. He voiced his understanding. He agrees with Charolotte Eke sent to preferred pharmacy.  Patient assistance forms placed up front for pickup. Nothing further needed.

## 2022-03-19 NOTE — Telephone Encounter (Signed)
Lm for patient.  

## 2022-03-21 ENCOUNTER — Other Ambulatory Visit: Payer: Self-pay

## 2022-03-24 ENCOUNTER — Other Ambulatory Visit: Payer: Self-pay | Admitting: Oncology

## 2022-03-25 ENCOUNTER — Encounter: Payer: Self-pay | Admitting: Oncology

## 2022-03-25 NOTE — Telephone Encounter (Signed)
TSH Order: [id number] Status: Final result    Visible to patient: Yes (seen)    Next appt: 04/04/2022 at 10:00 AM in Radiology ([facility])    Dx: Squamous cell carcinoma of right lung...    0 Result Notes           Component Ref Range & Units 2 wk ago (03/05/22) 1 mo ago (02/12/22) 2 mo ago (01/24/22) 3 mo ago (12/13/21) 4 mo ago (11/01/21) 6 mo ago (09/13/21) 6 mo ago (08/30/21)  TSH 0.350 - 4.500 uIU/mL 102.000 High   7.325 High  CM  70.454 High  CM  11.973 High  CM  17.996 High  CM  171.000 High  CM  84.000 High  CM   Comment: Performed by a 3rd Generation assay with a functional sensitivity of <=0.01 uIU/mL.  Performed at [facility], [address] [place], [place] [address]   Resulting Agency  [facility] CLIN LAB [place] [facility] [place] [facility] [place] [facility] [place] [facility] [place] [facility] CLIN LAB         Specimen Collected: 03/05/22 14:12 Last Resulted: 03/05/22 15:42      Lab Flowsheet    Order Details    View Encounter    Lab and Collection Details    Routing    Result History    View All Conversations on this Encounter      CM=Additional comments      Result Care Coordination   Patient Communication   Add Comments   Seen Back to Top       Other Results from 03/05/2022  T4, free Order: [id number] Status: Final result    Visible to patient: Yes (seen)    Next appt: 04/04/2022 at 10:00 AM in Radiology ([facility]-CT)    Dx: Squamous cell carcinoma of right lung...    0 Result Notes           Component Ref Range & Units 2 wk ago 1 mo ago 2 mo ago 3 mo ago 4 mo ago 6 mo ago 1 yr ago  Free T4 0.61 - 1.12 ng/dL 0.74  0.69 CM  0.92 CM  0.93 CM  0.65 CM  0.56 Low  CM  <0.25 Low  CM   Comment: (NOTE)  Biotin ingestion may interfere with free T4 tests. If the results are  inconsistent with the TSH level, previous test results, or the  clinical presentation, then consider biotin interference. If needed,  order repeat testing after stopping biotin.  Performed at [facility],  [address] [place],  [place] [address]   Resulting Agency  [facility] CLIN LAB [facility] CL[facility] LAB [facility] CLIN LAB [place] [facility] [place] [facility] CLIN LAB         Specimen Collected: 03/05/22 14:12 Last Resulted: 03/05/22 15:42

## 2022-03-27 ENCOUNTER — Telehealth (INDEPENDENT_AMBULATORY_CARE_PROVIDER_SITE_OTHER): Payer: Self-pay

## 2022-03-27 NOTE — Telephone Encounter (Signed)
I spoke with Dr Delana Meyer and he said ask pt if he was good with having a rt leg angio he would go ahead and get that scheduled.  Pt says he is fine with the angio and will wait for a phone call to get this scheduled.

## 2022-03-27 NOTE — Telephone Encounter (Signed)
Pt called and LVM stating that he needs a sooner appt, he can no longer walk 50 feet because of the pain in his legs.  Please advise

## 2022-03-27 NOTE — Telephone Encounter (Signed)
Please be sure to share this information with Mickel Baas as she will typically schedule people for procedures

## 2022-03-27 NOTE — Telephone Encounter (Signed)
I sent a secure chat to Mickel Baas per Dr Delana Meyer

## 2022-03-28 ENCOUNTER — Telehealth (INDEPENDENT_AMBULATORY_CARE_PROVIDER_SITE_OTHER): Payer: Self-pay

## 2022-03-28 NOTE — Telephone Encounter (Signed)
Spoke with the patient and he is scheduled with Dr. Delana Meyer for a right leg angio on 04/08/22 with a 9:00 am arrival time to the [facility]. Pre-procedure instructions were discussed and will be mailed.

## 2022-03-30 ENCOUNTER — Other Ambulatory Visit: Payer: Self-pay | Admitting: Oncology

## 2022-03-30 NOTE — Progress Notes (Signed)
ON PATHWAY REGIMEN - Non-Small Cell Lung  No Change  Continue With Treatment as Ordered.  Original Decision Date/Time: 11/21/2020 20:50     A cycle is every 14 days:     Durvalumab   **Always confirm dose/schedule in your pharmacy ordering system**  Patient Characteristics: Preoperative or Nonsurgical Candidate (Clinical Staging), Stage III - Nonsurgical Candidate (Nonsquamous and Squamous), PS = 0, 1 Therapeutic Status: Preoperative or Nonsurgical Candidate (Clinical Staging) AJCC T Category: cT1b AJCC N Category: cN2 AJCC M Category: cM0 AJCC 8 Stage Grouping: IIIA ECOG Performance Status: 1 Intent of Therapy: Curative Intent, Discussed with Patient

## 2022-04-04 ENCOUNTER — Ambulatory Visit
Admission: RE | Admit: 2022-04-04 | Discharge: 2022-04-04 | Disposition: A | Discharge status: Home / Self Care | Payer: Medicare Other | Source: Ambulatory Visit | Attending: Oncology | Admitting: Oncology

## 2022-04-04 DIAGNOSIS — C3491 Malignant neoplasm of unspecified part of right bronchus or lung: Secondary | ICD-10-CM

## 2022-04-04 MED ORDER — IOHEXOL 300 MG/ML  SOLN
75.0000 mL | Freq: Once | INTRAMUSCULAR | Status: AC | PRN
  Administered 2022-04-04: 75 mL via INTRAVENOUS

## 2022-04-07 ENCOUNTER — Inpatient Hospital Stay (HOSPITAL_BASED_OUTPATIENT_CLINIC_OR_DEPARTMENT_OTHER): Payer: Medicare Other | Admitting: Oncology

## 2022-04-07 ENCOUNTER — Inpatient Hospital Stay: Payer: Medicare Other | Attending: Oncology

## 2022-04-07 ENCOUNTER — Encounter: Payer: Self-pay | Admitting: Oncology

## 2022-04-07 VITALS — BP 139/83 | HR 88 | Temp 97.9°F | Ht 71.0 in | Wt 215.5 lb

## 2022-04-07 DIAGNOSIS — E032 Hypothyroidism due to medicaments and other exogenous substances: Secondary | ICD-10-CM | POA: Diagnosis not present

## 2022-04-07 DIAGNOSIS — C3491 Malignant neoplasm of unspecified part of right bronchus or lung: Secondary | ICD-10-CM

## 2022-04-07 DIAGNOSIS — C7931 Secondary malignant neoplasm of brain: Secondary | ICD-10-CM | POA: Diagnosis present

## 2022-04-07 DIAGNOSIS — I70229 Atherosclerosis of native arteries of extremities with rest pain, unspecified extremity: Secondary | ICD-10-CM | POA: Insufficient documentation

## 2022-04-07 DIAGNOSIS — C3411 Malignant neoplasm of upper lobe, right bronchus or lung: Secondary | ICD-10-CM | POA: Insufficient documentation

## 2022-04-07 DIAGNOSIS — Z5112 Encounter for antineoplastic immunotherapy: Secondary | ICD-10-CM | POA: Insufficient documentation

## 2022-04-07 DIAGNOSIS — I739 Peripheral vascular disease, unspecified: Secondary | ICD-10-CM | POA: Diagnosis not present

## 2022-04-07 LAB — T4, FREE: Free T4: 1 ng/dL (ref 0.61–1.12)

## 2022-04-07 LAB — COMPREHENSIVE METABOLIC PANEL
ALT: 27 U/L (ref 0–44)
AST: 28 U/L (ref 15–41)
Albumin: 3.9 g/dL (ref 3.5–5.0)
Alkaline Phosphatase: 70 U/L (ref 38–126)
Anion gap: 8 (ref 5–15)
BUN: 17 mg/dL (ref 8–23)
CO2: 27 mmol/L (ref 22–32)
Calcium: 9.1 mg/dL (ref 8.9–10.3)
Chloride: 110 mmol/L (ref 98–111)
Creatinine, Ser: 1.03 mg/dL (ref 0.61–1.24)
GFR, Estimated: >60 mL/min (ref >60)
Glucose, Bld: 101 mg/dL — ABNORMAL HIGH (ref 70–99)
Potassium: 4 mmol/L (ref 3.5–5.1)
Sodium: 145 mmol/L (ref 135–145)
Total Bilirubin: 0.5 mg/dL (ref 0.3–1.2)
Total Protein: 7.1 g/dL (ref 6.5–8.1)

## 2022-04-07 LAB — CBC WITH DIFFERENTIAL/PLATELET
Abs Immature Granulocytes: 0.02 10*3/uL (ref 0.00–0.07)
Basophils Absolute: 0.1 10*3/uL (ref 0.0–0.1)
Basophils Relative: 1 %
Eosinophils Absolute: 0.3 10*3/uL (ref 0.0–0.5)
Eosinophils Relative: 6 %
HCT: 41.6 % (ref 39.0–52.0)
Hemoglobin: 13.2 g/dL (ref 13.0–17.0)
Immature Granulocytes: 1 %
Lymphocytes Relative: 18 %
Lymphs Abs: 0.8 10*3/uL (ref 0.7–4.0)
MCH: 28 pg (ref 26.0–34.0)
MCHC: 31.7 g/dL (ref 30.0–36.0)
MCV: 88.1 fL (ref 80.0–100.0)
Monocytes Absolute: 0.6 10*3/uL (ref 0.1–1.0)
Monocytes Relative: 13 %
Neutro Abs: 2.7 10*3/uL (ref 1.7–7.7)
Neutrophils Relative %: 61 %
Platelets: 212 10*3/uL (ref 150–400)
RBC: 4.72 MIL/uL (ref 4.22–5.81)
RDW: 15.2 % (ref 11.5–15.5)
WBC: 4.4 10*3/uL (ref 4.0–10.5)
nRBC: 0 % (ref 0.0–0.2)

## 2022-04-07 LAB — TSH: TSH: 34.456 u[IU]/mL — ABNORMAL HIGH (ref 0.350–4.500)

## 2022-04-07 NOTE — Progress Notes (Signed)
Hematology/Oncology Progress note [phone] [phone]      Patient Care Team: [name], MD as PCP - General (Physician Assistant) [name], MD as Referring Physician (Physician Assistant) Bary Castilla, [name], MD (General Surgery) Telford Nab, RN as Oncology Nurse Navigator [name], MD as Consulting Physician (Oncology)   ASSESSMENT & PLAN:   Cancer Staging  Squamous cell carcinoma of right lung [facility]) Staging form: Lung, AJCC 8th Edition - Clinical stage from 09/20/2020: Stage IV (cT1b, cN2, cM1) - Signed by [name], MD on 12/13/2021   Squamous cell carcinoma of right lung ([place]) Durvalumab was previously held due to pneumonitis.  Repeat CT scan showed improvement of patchy ground-glass opacities.  Reviewed and discussed with patient Labs reviewed and discussed with patient. Shared decision was made to resume Durvalumab, proceed this week.   Hypothyroidism due to medication Continue Synthroid 88 mcg daily   Hypocalcemia Recommend patient to take Calcium $RemoveBefo'1200mg'ZKRNHCGEmRV$  daily.   PVD (peripheral vascular disease) (HCC) Continue Plavix and follow up with vascular surgery. Dr.Schnier recently stopped Eliquis    Malignant neoplasm metastatic to brain [facility]) Status post radiation.  02/19/22 MRI showed decreased brain lesion size and edema.  Continue surveillance every 3 months.    Orders Placed This Encounter  Procedures   T4, free    Standing Status:   Future    Standing Expiration Date:   04/12/2023   CBC with Differential    Standing Status:   Future    Standing Expiration Date:   04/12/2023   Comprehensive metabolic panel    Standing Status:   Future    Standing Expiration Date:   04/12/2023   TSH    Standing Status:   Future    Standing Expiration Date:   04/12/2023   CBC with Differential    Standing Status:   Future    Standing Expiration Date:   04/26/2023   Comprehensive metabolic panel    Standing Status:   Future     Standing Expiration Date:   04/26/2023   TSH    Standing Status:   Future    Standing Expiration Date:   04/26/2023    Follow up lab MD 2 weeks Durvalumab   All questions were answered. The patient knows to call the clinic with any problems, questions or concerns.  [name], MD, PhD [facility] Hematology Oncology 04/07/2022      CHIEF COMPLAINTS/REASON FOR VISIT:  Follow up for lung cancer  HISTORY OF PRESENTING ILLNESS:  Joshua [name] is a 66 y.o. male who has above history reviewed by me today presents for follow up visit for Stage IV lung squamous cell carcinoma. Oncology History  Squamous cell carcinoma of right lung ([place])  09/18/2020 Imaging   PET scan showed right upper lobe nodule maximal SUV 11.5.  Hypermetabolic cluster right paratracheal adenopathy with maximum SUV up to 12.  No findings of metastatic disease to the neck/abdomen/pelvis or skeleton.   09/20/2020 Initial Diagnosis   Squamous cell carcinoma of right lung (HCC) NGS showed PD-L1 TPS 95%, LRP1B S963fs, PIK3CA E545K, RB1 c1422-2A>T, RIT1 T83_ A84del, STK11 P242fs, TPS53 R248W, TMB 19.28mut/mb, MS stable.    09/20/2020 Cancer Staging   Staging form: Lung, AJCC 8th Edition - Clinical stage from 09/20/2020: Stage IV (cT1b, cN2, cM1) - Signed by [name], MD on 12/13/2021   10/03/2020 Imaging   MRI brain with and without contrast showed no evidence of intracranial metastatic disease. Well-defined round lesion within the left parotid  tail measuring approximately 2.3 x 1.8 x 1.8 cm   10/09/2020 - 11/27/2020 Radiation Therapy   concurrent chemoradiation   10/10/2020 - 10/31/2020 Chemotherapy   10/10/2020 concurrent chemoradiation.  carboplatin AUC of 2 and Taxol 45 mg/m2 x 4.  Additional chemotherapy was held due to thrombocytopenia, neutropenia       12/31/2020 - 03/21/2022 Chemotherapy   Maintenance  Durvalumab q14d      12/31/2020 -  Chemotherapy   Patient is on Treatment Plan : LUNG Durvalumab (10) q14d      03/28/2021 Imaging   CT without contrast showed new linear right perihilar consolidation with associated traction bronchiectasis.  Likely postradiation changes.  Additional new peribronchial vascular/nodular groundglass opacities with areas of consolidation or seen primarily in the right upper and lower lobes, more distant from expected radiation fields. 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  Per my discussion with pulmonology Dr. 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     05/09/2021 Imaging   Repeat CT chest without contrast showed evolving radiation changes in the right perihilar region likely accounting for interval increase to thickening of the posterior wall of the right upper lobe bronchus.  The more peripheral right lung groundglass opacities seen on most recent study have generally improved with exception of one peripherally right upper lobe which appears new.  These are likely inflammatory.  No findings highly suspicious for local recurrence.   05/30/2021 - 01/10/2022 Chemotherapy   Durvalumab q14d     10/07/2021 Imaging   MRI brain with and without contrast showed 2 contrast-enhancing lesions, most consistent with metastatic disease.  Largest lesion is in the left frontal lobe and measures up to 1.3 cm with a large amount of surrounding edema and 9 mm rightward midline shift.  The other enhancing lesion was 4 mm in the right occipital lobe with a small amount of surrounding edema.  No acute or chronic hemorrhage.   10/07/2021 Progression   Headache after colonoscopy, progressive difficulty using her right arm.  Progressively worsening of speech. Stage IV lung cancer with brain metastasis.  started on Dexamethasone.  He was seen by neurosurgery and radiation oncology.  Recommendation is to proceed with SRS.  Patient was discharged with dexamethasone    10/16/2021 Imaging   PET scan showed postradiation changes in the right supra hilar lung without evidence of local recurrence.  No evidence of lung cancer recurrence or metastatic disease.  Single focus of intense metabolic activity associated with posterior left 11th rib likely secondary to recent fall/posttraumatic metabolic changes.  Frontal  uptake in the left frontal lobe corresponding to the known metastatic lesion in the brain   10/29/2021 -  Radiation Therapy   Brain Radiation [facility]   12/10/2021 Imaging   Brain MRI: Continued interval decrease in size of a left frontal lobe metastasis, now measuring 10 mm. Moderate surrounding edema has also decreased.  3-4 mm metastasis within the right occipital lobe, not significantly changed in size. There is now a small focus of central non enhancement within this lesion. Increased edema. 3 mm enhancing metastasis within the high left parietal lobe. In retrospect, this was likely present as a subtle punctate focus of enhancement on the prior brain MRI of 10/22/2021.   01/18/2022 Imaging   CT chest abdomen pelvis w contrast 1. Stable post treatment changes in the right upper lobe medially with dense radiation fibrosis but no findings to  suggest local recurrent tumor. 2. Significant progression of ill-defined ground-glass opacity, interstitial thickening and airspace nodularity in the right upper lobe and upper aspect of the right lower lobe. Findings could reflect an inflammatory or atypical infectious process, drug induced pneumonitis or interstitial spread of tumor.3. No mediastinal or hilar mass or adenopathy. 4. No findings for abdominal/pelvic metastatic disease or osseous metastatic disease.   02/19/2022 Imaging   MRI brain w wo contrast showed Three metastatic deposits in the brain appear smaller. Decreased edema in the left parietal lobe. No new lesions    04/04/2022 Imaging   CT chest w contrast 1. Stable radiation changes in the right perihilar  region without evidence of local recurrence. 2. Interval improvement in the patchy ground-glass opacities previously demonstrated in both lungs, most consistent with a resolving infectious or inflammatory process (including immunotherapy-related pneumonitis). 3. No evidence of metastatic disease.    Today he feels well. No new complaints. Chronic SOB, stable, not worse.  Intermittent headache, is better. denies any focal deficits. Stable weight. No focal deficit  Review of Systems  Constitutional:  Positive for fatigue. Negative for appetite change, chills, fever and unexpected weight change.  HENT:   Negative for hearing loss and voice change.   Eyes:  Negative for eye problems and icterus.  Respiratory:  Negative for chest tightness, cough and shortness of breath.   Cardiovascular:  Negative for chest pain and leg swelling.  Gastrointestinal:  Negative for abdominal distention and abdominal pain.  Endocrine: Negative for hot flashes.  Genitourinary:  Negative for difficulty urinating, dysuria and frequency.   Musculoskeletal:  Negative for arthralgias.  Skin:  Negative for itching and rash.  Neurological:  Positive for headaches. Negative for light-headedness and numbness.  Hematological:  Negative for adenopathy. Does not bruise/bleed easily.  Psychiatric/Behavioral:  Negative for confusion.       MEDICAL HISTORY:  Past Medical History:  Diagnosis Date   Allergy    Cancer ([place])    lung   Dyspnea    former smoker. only doe   History of kidney stones    Hypertension    Peripheral vascular disease ([place])    Pre-diabetes    Sleep apnea     SURGICAL HISTORY: Past Surgical History:  Procedure Laterality Date   ANTERIOR CERVICAL DECOMP/DISCECTOMY FUSION N/A 03/14/2020   Procedure: ANTERIOR CERVICAL DECOMPRESSION/DISCECTOMY FUSION 3 LEVELS C4-7;  Surgeon: Meade Maw, MD;  Location: [facility] ORS;  Service: Neurosurgery;  Laterality: N/A;   BACK SURGERY  2011   CENTRAL LINE  INSERTION Right 09/10/2016   Procedure: CENTRAL LINE INSERTION;  Surgeon: Katha Cabal, MD;  Location: [facility] ORS;  Service: Vascular;  Laterality: Right;   CERVICAL FUSION     C4-c7 on Mar 14, 2020   COLONOSCOPY  2013 ?   COLONOSCOPY N/A 09/09/2021   Procedure: COLONOSCOPY;  Surgeon: [name], DO;  Location: [facility] ENDOSCOPY;  Service: Gastroenterology;  Laterality: N/A;   CORONARY ANGIOPLASTY     ESOPHAGOGASTRODUODENOSCOPY N/A 09/09/2021   Procedure: ESOPHAGOGASTRODUODENOSCOPY (EGD);  Surgeon: [name], DO;  Location: [facility] ENDOSCOPY;  Service: Gastroenterology;  Laterality: N/A;   FEMORAL-POPLITEAL BYPASS GRAFT Right 09/10/2016   Procedure: BYPASS GRAFT FEMORAL-POPLITEAL ARTERY ( BELOW KNEE );  Surgeon: Katha Cabal, MD;  Location: [facility] ORS;  Service: Vascular;  Laterality: Right;   FEMORAL-TIBIAL BYPASS GRAFT Right 03/23/2020   Procedure: BYPASS GRAFT RIGHT FEMORAL- DISTAL TIBIAL ARTERY WITH DISTAL FLOW GRAFT;  Surgeon: Katha Cabal, MD;  Location: [facility] ORS;  Service:  Vascular;  Laterality: Right;   LEFT HEART CATH AND CORONARY ANGIOGRAPHY Left 05/16/2021   Procedure: LEFT HEART CATH AND CORONARY ANGIOGRAPHY;  Surgeon: Andrez Grime, MD;  Location: [place] CV LAB;  Service: Cardiovascular;  Laterality: Left;   LOWER EXTREMITY ANGIOGRAPHY Right 11/18/2016   Procedure: Lower Extremity Angiography;  Surgeon: Katha Cabal, MD;  Location: [place] CV LAB;  Service: Cardiovascular;  Laterality: Right;   LOWER EXTREMITY ANGIOGRAPHY Right 12/09/2016   Procedure: Lower Extremity Angiography;  Surgeon: Katha Cabal, MD;  Location: [place] [facility] LAB;  Service: Cardiovascular;  Laterality: Right;   LOWER EXTREMITY ANGIOGRAPHY Right 11/15/2019   Procedure: LOWER EXTREMITY ANGIOGRAPHY;  Surgeon: Katha Cabal, MD;  Location: [place] [facility] LAB;  Service: Cardiovascular;  Laterality: Right;   LOWER EXTREMITY ANGIOGRAPHY Right 12/01/2019    Procedure: Lower Extremity Angiography;  Surgeon: Algernon Huxley, MD;  Location: [place] [facility] LAB;  Service: Cardiovascular;  Laterality: Right;   LOWER EXTREMITY ANGIOGRAPHY Right 12/02/2019   Procedure: LOWER EXTREMITY ANGIOGRAPHY;  Surgeon: Katha Cabal, MD;  Location: [place] [facility] LAB;  Service: Cardiovascular;  Laterality: Right;   LOWER EXTREMITY ANGIOGRAPHY Right 03/23/2020   Procedure: Lower Extremity Angiography;  Surgeon: Katha Cabal, MD;  Location: [place] [facility] LAB;  Service: Cardiovascular;  Laterality: Right;   LOWER EXTREMITY ANGIOGRAPHY Right 09/25/2021   Procedure: Lower Extremity Angiography;  Surgeon: Katha Cabal, MD;  Location: [place] [facility];  Service: Cardiovascular;  Laterality: Right;   LOWER EXTREMITY ANGIOGRAPHY Right 09/26/2021   Procedure: Lower Extremity Angiography;  Surgeon: Algernon Huxley, MD;  Location: [place] [facility];  Service: Cardiovascular;  Laterality: Right;   LOWER EXTREMITY INTERVENTION  11/18/2016   Procedure: Lower Extremity Intervention;  Surgeon: Katha Cabal, MD;  Location: [place] [facility];  Service: Cardiovascular;;   PERIPHERAL VASCULAR CATHETERIZATION Right 01/02/2015   Procedure: Lower Extremity Angiography;  Surgeon: Katha Cabal, MD;  Location: [place] [facility];  Service: Cardiovascular;  Laterality: Right;   PERIPHERAL VASCULAR CATHETERIZATION Right 06/18/2015   Procedure: Lower Extremity Angiography;  Surgeon: Algernon Huxley, MD;  Location: [place] [facility];  Service: Cardiovascular;  Laterality: Right;   PERIPHERAL VASCULAR CATHETERIZATION  06/18/2015   Procedure: Lower Extremity Intervention;  Surgeon: Algernon Huxley, MD;  Location: [place] [facility];  Service: Cardiovascular;;   PERIPHERAL VASCULAR CATHETERIZATION N/A 10/09/2015   Procedure: Abdominal Aortogram w/Lower Extremity;  Surgeon: Katha Cabal, MD;  Location: [place] [facility];  Service: Cardiovascular;  Laterality: N/A;    PERIPHERAL VASCULAR CATHETERIZATION  10/09/2015   Procedure: Lower Extremity Intervention;  Surgeon: Katha Cabal, MD;  Location: [place] [facility];  Service: Cardiovascular;;   PERIPHERAL VASCULAR CATHETERIZATION Right 10/10/2015   Procedure: Lower Extremity Angiography;  Surgeon: Algernon Huxley, MD;  Location: [place] [facility];  Service: Cardiovascular;  Laterality: Right;   PERIPHERAL VASCULAR CATHETERIZATION Right 10/10/2015   Procedure: Lower Extremity Intervention;  Surgeon: Algernon Huxley, MD;  Location: [place] [facility];  Service: Cardiovascular;  Laterality: Right;   PERIPHERAL VASCULAR CATHETERIZATION Right 12/03/2015   Procedure: Lower Extremity Angiography;  Surgeon: Algernon Huxley, MD;  Location: [place] [facility];  Service: Cardiovascular;  Laterality: Right;   PERIPHERAL VASCULAR CATHETERIZATION Right 12/04/2015   Procedure: Lower Extremity Angiography;  Surgeon: Algernon Huxley, MD;  Location: [place] [facility];  Service: Cardiovascular;  Laterality: Right;   PERIPHERAL VASCULAR CATHETERIZATION  12/04/2015  Procedure: Lower Extremity Intervention;  Surgeon: Algernon Huxley, MD;  Location: [place] [facility];  Service: Cardiovascular;;   PERIPHERAL VASCULAR CATHETERIZATION Right 06/30/2016   Procedure: Lower Extremity Angiography;  Surgeon: Algernon Huxley, MD;  Location: [place] [facility];  Service: Cardiovascular;  Laterality: Right;   PERIPHERAL VASCULAR CATHETERIZATION Right 06/25/2016   Procedure: Lower Extremity Angiography;  Surgeon: Katha Cabal, MD;  Location: [place] [facility];  Service: Cardiovascular;  Laterality: Right;   PERIPHERAL VASCULAR CATHETERIZATION Right 07/01/2016   Procedure: Lower Extremity Angiography;  Surgeon: Katha Cabal, MD;  Location: [place] [facility];  Service: Cardiovascular;  Laterality: Right;   PERIPHERAL VASCULAR CATHETERIZATION  07/01/2016   Procedure: Lower Extremity Intervention;  Surgeon: Katha Cabal, MD;  Location:  [place] [facility];  Service: Cardiovascular;;   PERIPHERAL VASCULAR CATHETERIZATION Right 08/01/2016   Procedure: Lower Extremity Angiography;  Surgeon: Katha Cabal, MD;  Location: [place] [facility];  Service: Cardiovascular;  Laterality: Right;   PORTA CATH INSERTION N/A 10/05/2020   Procedure: PORTA CATH INSERTION;  Surgeon: Katha Cabal, MD;  Location: [place] [facility];  Service: Cardiovascular;  Laterality: N/A;   stent placement in right leg Right    VIDEO BRONCHOSCOPY N/A 09/14/2020   Procedure: VIDEO BRONCHOSCOPY WITH FLUORO;  Surgeon: Tyler Pita, MD;  Location: [facility] ORS;  Service: Cardiopulmonary;  Laterality: N/A;   VIDEO BRONCHOSCOPY WITH ENDOBRONCHIAL ULTRASOUND N/A 09/14/2020   Procedure: VIDEO BRONCHOSCOPY WITH ENDOBRONCHIAL ULTRASOUND;  Surgeon: Tyler Pita, MD;  Location: [facility] ORS;  Service: Cardiopulmonary;  Laterality: N/A;    SOCIAL HISTORY: Social History   Socioeconomic History   Marital status: Single    Spouse name: Not on file   Number of children: Not on file   Years of education: Not on file   Highest education level: Not on file  Occupational History   Occupation: unemployed  Tobacco Use   Smoking status: Former    Packs/day: 2.00    Years: 38.00    Total pack years: 76.00    Types: Cigarettes    Quit date: 12/20/2014    Years since quitting: 7.3   Smokeless tobacco: Never   Tobacco comments:    quit smoking in 2017 most smoked 2   Vaping Use   Vaping Use: Former  Substance and Sexual Activity   Alcohol use: Not Currently   Drug use: No   Sexual activity: Yes  Other Topics Concern   Not on file  Social History Narrative   Not on file   Social Determinants of Health   Financial Resource Strain: Not on file  Food Insecurity: Not on file  Transportation Needs: Not on file  Physical Activity: Not on file  Stress: Not on file  Social Connections: Not on file  Intimate Partner Violence: Not on file    FAMILY  HISTORY: Family History  Problem Relation Age of Onset   Diabetes Mother    Hypertension Mother    Heart murmur Mother    Leukemia Mother    Throat cancer Maternal Grandmother    Colon cancer Neg Hx    Breast cancer Neg Hx     ALLERGIES:  has No Known Allergies.  MEDICATIONS:  Current Outpatient Medications  Medication Sig Dispense Refill   acetaminophen (TYLENOL) 325 MG tablet Take 650 mg by mouth every 6 (six) hours as needed for moderate pain.     clopidogrel (PLAVIX) 75 MG tablet TAKE 1 TABLET BY MOUTH  DAILY 100 tablet 2   Glycopyrrolate-Formoterol (BEVESPI AEROSPHERE) 9-4.8 MCG/ACT AERO Inhale 2 puffs into the lungs 2 (two) times daily. 32.1 g 1   levETIRAcetam (KEPPRA) 500 MG tablet Take 1 tablet (500 mg total) by mouth 2 (two) times daily. 180 tablet 3   levothyroxine (SYNTHROID) 88 MCG tablet TAKE 1 TABLET BY MOUTH ONCE DAILY BEFORE BREAKFAST 30 tablet 0   simvastatin (ZOCOR) 40 MG tablet Take 40 mg by mouth at bedtime.     omeprazole (PRILOSEC) 40 MG capsule Take 1 capsule (40 mg total) by mouth daily. 90 capsule 1   No current facility-administered medications for this visit.     PHYSICAL EXAMINATION: ECOG PERFORMANCE STATUS: 1 - Symptomatic but completely ambulatory Vitals:   04/07/22 1402  BP: 139/83  Pulse: 88  Temp: 97.9 F (36.6 C)  SpO2: 100%   Filed Weights   04/07/22 1402  Weight: 215 lb 8 oz (97.8 kg)    Physical Exam Constitutional:      General: He is not in acute distress.    Appearance: He is not diaphoretic.     Comments: Patient walks with a cane  HENT:     Head: Normocephalic and atraumatic.     Nose: Nose normal.     Mouth/Throat:     Pharynx: No oropharyngeal exudate.  Eyes:     General: No scleral icterus.    Pupils: Pupils are equal, round, and reactive to light.  Cardiovascular:     Rate and Rhythm: Normal rate and regular rhythm.     Heart sounds: No murmur heard. Pulmonary:     Effort: Pulmonary effort is normal. No  respiratory distress.     Breath sounds: No rales.  Chest:     Chest wall: No tenderness.  Abdominal:     General: There is no distension.     Palpations: Abdomen is soft.     Tenderness: There is no abdominal tenderness.  Musculoskeletal:        General: Normal range of motion.     Cervical back: Normal range of motion and neck supple.  Skin:    General: Skin is warm and dry.     Findings: No erythema.  Neurological:     Mental Status: He is alert and oriented to person, place, and time.     Cranial Nerves: No cranial nerve deficit.     Motor: No abnormal muscle tone.     Coordination: Coordination normal.  Psychiatric:        Mood and Affect: Affect normal.      LABORATORY DATA:  I have reviewed the data as listed    Latest Ref Rng & Units 04/07/2022    1:45 PM 03/05/2022    2:12 PM 02/12/2022    9:59 AM  CBC  WBC 4.0 - 10.5 K/uL 4.4  5.3  12.9   Hemoglobin 13.0 - 17.0 g/dL 13.2  12.5  13.1   Hematocrit 39.0 - 52.0 % 41.6  40.1  40.5   Platelets 150 - 400 K/uL 212  140  203       Latest Ref Rng & Units 04/07/2022    1:45 PM 03/05/2022    2:12 PM 02/12/2022    9:59 AM  CMP  Glucose 70 - 99 mg/dL 101  76  118   BUN 8 - 23 mg/dL $Remove'17  20  28   'EqHrEQM$ Creatinine 0.61 - 1.24 mg/dL 1.03  1.10  1.04   Sodium 135 - 145 mmol/L 145  142  138   Potassium 3.5 - 5.1 mmol/L 4.0  4.1  4.2   Chloride 98 - 111 mmol/L 110  105  103   CO2 22 - 32 mmol/L $RemoveB'27  30  28   'gaTPyhZp$ Calcium 8.9 - 10.3 mg/dL 9.1  9.2  9.4   Total Protein 6.5 - 8.1 g/dL 7.1  7.0  7.4   Total Bilirubin 0.3 - 1.2 mg/dL 0.5  0.6  0.7   Alkaline Phos 38 - 126 U/L 70  47  53   AST 15 - 41 U/L $Remo'28  30  26   'baIZf$ ALT 0 - 44 U/L 27  43  34      RADIOGRAPHIC STUDIES: I have personally reviewed the radiological images as listed and agreed with the findings in the report. 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  Chemotherapy, radiation and immunotherapy completed. * Tracking Code: BO * EXAM: CT CHEST WITH CONTRAST TECHNIQUE: Multidetector CT imaging of the chest was performed during intravenous contrast administration. RADIATION DOSE REDUCTION: This exam was performed according to the departmental dose-optimization program which includes automated exposure control, adjustment of the mA and/or kV according to patient size and/or use of iterative reconstruction technique. CONTRAST:  104mL OMNIPAQUE IOHEXOL 300 MG/ML  SOLN COMPARISON:  Multiple priors, including CT 01/17/2022 and PET-CT 10/16/2021. FINDINGS: Cardiovascular: Right IJ Port-A-Cath extends to the superior cavoatrial junction. No acute vascular findings. Minimal aortic and coronary artery atherosclerosis. The heart size is normal. There is no pericardial effusion. Mediastinum/Nodes: There are no enlarged mediastinal, hilar or axillary lymph nodes.Stable right perihilar soft tissue thickening. No definite thyroid tissue identified. The esophagus and trachea appear unremarkable. Lungs/Pleura: Trace right pleural effusion. No pneumothorax. There are stable radiation changes in the right perihilar region with architectural distortion and traction bronchiectasis. No recurrent mass lesion identified. The patchy ground-glass opacity seen on the most recent examination have improved. No suspicious pulmonary nodules. Upper abdomen: Mild hepatic steatosis. The visualized upper abdomen otherwise appears unremarkable. Musculoskeletal/Chest wall: There is no chest wall mass or suspicious osseous finding. Old left-sided rib fractures. IMPRESSION: 1. Stable radiation changes in the right perihilar region without evidence of local recurrence. 2. Interval improvement in the patchy ground-glass opacities previously demonstrated in both lungs, most consistent with a resolving infectious or inflammatory process (including immunotherapy-related pneumonitis). 3. No evidence of metastatic  disease. Electronically Signed   By: Richardean Sale M.D.   On: 04/06/2022 11:53   VAS [place] LOWER EXTREMITY ARTERIAL DUPLEX  Result Date: 03/10/2022 LOWER EXTREMITY ARTERIAL DUPLEX STUDY Patient Name:  Joshua [name]  Date of Exam:   03/10/2022 Medical Rec #: [id number]            Accession #:    [id number] Date of Birth: 14-Dec-1955           Patient Gender: M Patient Age:   5 years Exam Location:  [facility] Procedure:      VAS [place] LOWER EXTREMITY ARTERIAL DUPLEX Referring Phys: [name] --------------------------------------------------------------------------------  Indications: Claudication, peripheral artery disease, and pain post workout. High Risk Factors: Past history of smoking. Other Factors: ASO s/p multiple interventions and bypass.                11/15/2019 Right thrombectomy of Fempop insitu graft with added                stent  throughout.                 12/01/2019: Aortogram and Selective Right Lower Extremity                Angiogram. 6 mg of TPA delivered in a lysis catheter from the                Origin of the Right Femoral -Popliteal bypass down the proximal                ATA with placement of the lysis.                 12/02/2019: PTA of the Right Posterior Tibial Artery. PTA of the                Right Peroneal Artery. Mechanical Thrombectomy of the Right                Femoral -Popliteal Bypass graft. Mechanical Thrombectomy of the                Right Tibioperoneal Trunk and Posterior Tibial Artery.                 09/25/2021: Right Lower Extremity Angiography third order                catheter placement. PTA of the proximal peroneal and                tibioperoneal trunk to 2.5 mm with an ultra score balloon. PTA of                the Femoral Artery and the proximal anastomosis of the bypass                graft to 4 mm with an ultra score balloon. Administration of 5 mg                of tpa throughout the entire bypass and in the peroneal Artery.                 Initiation of continuous tpa infusion Right lower Extremity for                limb salvage. Insertion of a triple lumen catheter Left CFV with                [place] and fluoroscopic guidance.                 09/26/2021: Right LE Angiogram. PTA of the Right Posterior Tibial                Artery with 2.5 mm diameter by 30 cm length angioplasty balloon.                PTA of the distal bypass anastomosis and distal bypass graft                analogous to the Popliteal artery with 4 mm diameter by 8 cm                length Lutonix drug coated angioplasty balloon.  Vascular Interventions: Hx of multiple right lower extremity PTA's/stents.                         09/10/2016 Right femoral to below the knee popliteal  artery bypass graft placement. 5/01 Right bypass graft &                         mid popliteal artery PTA & coil embolectomy of large                         branch of bypass graft. 12/09/16 Right distal bypass                         graft PTA/stent and right posterior tibial artery PTA.                         03/23/2020 Rt PTA thrombectomy. Rt Profunda fem artery to                         PTA bypass. Current ABI:            Right: 0.28, Left: 1.21 Performing Technologist: Delorise Shiner RVT  Examination Guidelines: A complete evaluation includes B-mode imaging, spectral Doppler, color Doppler, and power Doppler as needed of all accessible portions of each vessel. Bilateral testing is considered an integral part of a complete examination. Limited examinations for reoccurring indications may be performed as noted.  +----------+--------+-----+--------+----------+--------+ RIGHT     PSV cm/sRatioStenosisWaveform  Comments +----------+--------+-----+--------+----------+--------+ CFA Distal76                   triphasic          +----------+--------+-----+--------+----------+--------+ DFA       47                   biphasic            +----------+--------+-----+--------+----------+--------+ SFA Prox  0                                       +----------+--------+-----+--------+----------+--------+ SFA Mid   0                                       +----------+--------+-----+--------+----------+--------+ SFA Distal0                                       +----------+--------+-----+--------+----------+--------+ POP Prox  35                   monophasic         +----------+--------+-----+--------+----------+--------+ PTA Distal12                   monophasic         +----------+--------+-----+--------+----------+--------+  Right Graft #1: Fem- pop +------------------+--------+--------+----------+--------+                   PSV cm/sStenosisWaveform  Comments +------------------+--------+--------+----------+--------+ Inflow            76              monophasic         +------------------+--------+--------+----------+--------+ Prox Anastomosis  0                                  +------------------+--------+--------+----------+--------+  Proximal Graft    0                                  +------------------+--------+--------+----------+--------+ Mid Graft         0                                  +------------------+--------+--------+----------+--------+ Distal Graft      0                                  +------------------+--------+--------+----------+--------+ Distal Anastomosis0                                  +------------------+--------+--------+----------+--------+ Outflow           35              monophasic         +------------------+--------+--------+----------+--------+   Summary: Right: Total occlusion noted in the superficial femoral artery. Occluded right fem-pop bypass graft.  See table(s) above for measurements and observations. Electronically signed by [name] MD on 03/10/2022 at 11:05:38 PM.    Final    VAS [place] ABI WITH/WO TBI  Result Date:  03/10/2022  LOWER EXTREMITY DOPPLER STUDY Patient Name:  Joshua [name]  Date of Exam:   03/10/2022 Medical Rec #: [id number]            Accession #:    [id number] Date of Birth: 08-19-1955           Patient Gender: M Patient Age:   13 years Exam Location:  [facility] Procedure:      VAS [place] ABI WITH/WO TBI Referring Phys: [facility] --------------------------------------------------------------------------------  Indications: Claudication, peripheral artery disease, and Routine f/u. High Risk Factors: Hyperlipidemia, past history of smoking. Other Factors: ASO s/p multiple interventions and bypass.                11/15/2019 Right thrombectomy of Fempop insitu graft with added                stent throughout.                 12/01/2019: Aortogram and Selective Right Lower Extremity                Angiogram. 6 mg of TPA delivered in a lysis catheter from the                Origin of the Right Femoral -Popliteal bypass down the proximal                ATA with placement of the lysis.                 12/02/2019: PTA of the Right Posterior Tibial Artery. PTA of the                Right Peroneal Artery. Mechanical Thrombectomy of the Right                Femoral -Popliteal Bypass graft. Mechanical Thrombectomy of the                Right Tibioperoneal Trunk and Posterior Tibial Artery.  09/25/2021: Right Lower Extremity Angiography third order                catheter placement. PTA of the proximal peroneal and                tibioperoneal trunk to 2.5 mm with an ultra score balloon. PTA of                the Femoral Artery and the proximal anastomosis of the bypass                graft to 4 mm with an ultra score balloon. Administration of 5 mg                of tpa throughout the entire bypass and in the peroneal Artery.                Initiation of continuous tpa infusion Right lower Extremity for                limb salvage. Insertion of a triple lumen catheter Left CFV with                 [place] and fluoroscopic guidance.                 09/26/2021: Right LE Angiogram. PTA of the Right Posterior Tibial                Artery with 2.5 mm diameter by 30 cm length angioplasty balloon.                PTA of the distal bypass anastomosis and distal bypass graft                analogous to the Popliteal artery with 4 mm diameter by 8 cm                length Lutonix drug coated angioplasty balloon.  Vascular Interventions: Hx of multiple right lower extremity PTA's/stents.                         09/10/2016 Right femoral to below the knee popliteal                         artery bypass graft placement. 5/01 Right bypass graft &                         mid popliteal artery PTA & coil embolectomy of large                         branch of bypass graft. 12/09/16 Right distal bypass                         graft PTA/stent and right posterior tibial artery PTA.                         03/23/2020 Rt PTA thrombectomy. Rt Profunda fem artery to                         PTA bypass. Performing Technologist: Delorise Shiner RVT  Examination Guidelines: A complete evaluation includes at minimum, Doppler waveform signals and systolic blood pressure reading at the level of bilateral brachial, anterior tibial, and posterior tibial arteries, when vessel segments are  accessible. Bilateral testing is considered an integral part of a complete examination. Photoelectric Plethysmograph (PPG) waveforms and toe systolic pressure readings are included as required and additional duplex testing as needed. Limited examinations for reoccurring indications may be performed as noted.  ABI Findings: +---------+------------------+-----+----------+--------+ Right    Rt Pressure (mmHg)IndexWaveform  Comment  +---------+------------------+-----+----------+--------+ Brachial 147                                       +---------+------------------+-----+----------+--------+ ATA      33                0.22 monophasic          +---------+------------------+-----+----------+--------+ PTA      42                0.28 monophasic         +---------+------------------+-----+----------+--------+ Great Toe0                 0.00                    +---------+------------------+-----+----------+--------+ +---------+------------------+-----+---------+-------+ Left     Lt Pressure (mmHg)IndexWaveform Comment +---------+------------------+-----+---------+-------+ Brachial 152                                     +---------+------------------+-----+---------+-------+ ATA      184               1.21 triphasic        +---------+------------------+-----+---------+-------+ PTA      180               1.18 triphasic        +---------+------------------+-----+---------+-------+ Great Toe146               0.96                  +---------+------------------+-----+---------+-------+ +-------+-----------+-----------+------------+------------+ ABI/TBIToday's ABIToday's TBIPrevious ABIPrevious TBI +-------+-----------+-----------+------------+------------+ Right  0.28       0.00       1.06        0.97         +-------+-----------+-----------+------------+------------+ Left   1.21       0.96       1.33        1.25         +-------+-----------+-----------+------------+------------+ Right ABIs appear decreased compared to prior study on 10/23/2021. Left ABIs appear essentially unchanged compared to prior study on 10/23/2021.  Summary: Right: Resting right ankle-brachial index indicates critical limb ischemia. The right toe-brachial index is abnormal. Left: Resting left ankle-brachial index is within normal range. The left toe-brachial index is normal. *See table(s) above for measurements and observations.  Electronically signed by [name] MD on 03/10/2022 at 6:05:29 PM.    Final    MR Brain W Wo Contrast  Result Date: 02/19/2022 CLINICAL DATA:  Lung cancer with metastatic disease. Assess response to  treatment. EXAM: MRI HEAD WITHOUT AND WITH CONTRAST TECHNIQUE: Multiplanar, multiecho pulse sequences of the brain and surrounding structures were obtained without and with intravenous contrast. CONTRAST:  29mL GADAVIST GADOBUTROL 1 MMOL/ML IV SOLN COMPARISON:  MRI head 12/10/2021 FINDINGS: Brain: 6 mm enhancing lesion left frontal parietal lobe has improved. Mild amount of surrounding edema has improved. Subtle enhancing lesion in the high left parietal lobe, 18/157 appears smaller and shows minimal  enhancement. Enhancing lesion right occipital lobe measures approximately 3 mm and is smaller. No new lesions identified. No acute infarct. Patchy white matter hyperintensities bilaterally consistent with chronic microvascular ischemia. Negative for hemorrhage or hydrocephalus. Vascular: Normal arterial flow voids at the skull base. Skull and upper cervical spine: Negative Sinuses/Orbits: Mild mucosal edema paranasal sinuses. 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 Decreased edema in the left parietal lobe. No new lesions. Electronically Signed   By: Franchot Gallo M.D.   On: 02/19/2022 18:09   DG Chest 2 View  Result Date: 02/11/2022 CLINICAL DATA:  History of pneumonitis.  Follow-up. EXAM: CHEST - 2 VIEW COMPARISON:  CT C AP January 17, 2022; chest radiograph January 14, 2021 FINDINGS: Right anterior chest wall Port-A-Cath is present with tip projecting over the superior vena cava. Redemonstrated right suprahilar masslike opacity. Scattered heterogeneous opacities right mid and lower lung. No pleural effusion or pneumothorax. Thoracic spine degenerative changes. IMPRESSION: Redemonstrated right mid and lower lung airspace opacities which may represent infectious process in the appropriate clinical setting. Alternative etiologies such as interstitial spread of tumor not excluded. Consider short-term follow-up chest CT. Electronically Signed   By: Lovey Newcomer M.D.   On:  02/11/2022 11:00   CT CHEST ABDOMEN PELVIS W CONTRAST  Result Date: 01/18/2022 CLINICAL DATA:  Restaging metastatic lung cancer. EXAM: CT CHEST, ABDOMEN, AND PELVIS WITH CONTRAST TECHNIQUE: Multidetector CT imaging of the chest, abdomen and pelvis was performed following the standard protocol during bolus administration of intravenous contrast. RADIATION DOSE REDUCTION: This exam was performed according to the departmental dose-optimization program which includes automated exposure control, adjustment of the mA and/or kV according to patient size and/or use of iterative reconstruction technique. CONTRAST:  110mL OMNIPAQUE IOHEXOL 300 MG/ML  SOLN COMPARISON:  PET-CT 10/16/2021 and prior chest CT 08/22/2021 FINDINGS: CT CHEST FINDINGS Cardiovascular: The heart is normal in size. No pericardial effusion. The aorta is normal in caliber. No dissection. The branch vessels are patent. Stable right IJ power port. Scattered coronary artery calcifications are stable. Mediastinum/Nodes: No mediastinal or hilar mass or lymphadenopathy. The esophagus is grossly normal. 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 I do not see any progressive soft tissue thickening to suggest recurrent tumor. However, there is significant progression of ill-defined ground-glass opacity, interstitial thickening and airspace nodularity in the right upper lobe and also in the upper aspect of the right lower lobe. Findings could reflect an inflammatory or infectious process but interstitial spread of tumor is also possible. Drug induced pneumonitis in combination with radiation pneumonitis would be another possibility. The left lung remains relatively clear. No pleural effusions or pleural lesions. Musculoskeletal: No chest wall mass, supraclavicular or axillary adenopathy. The bony thorax is intact. CT ABDOMEN PELVIS FINDINGS Hepatobiliary: No hepatic lesions to suggest metastatic  disease. No intrahepatic biliary dilatation. The gallbladder is unremarkable. No common bile duct dilatation. Pancreas: No mass, inflammation or ductal dilatation. Spleen: Normal size. No focal lesions. Adrenals/Urinary Tract: Adrenal glands and kidneys are unremarkable. The bladder is normal. Stomach/Bowel: The stomach, duodenum, small bowel and colon are unremarkable. No acute inflammatory process, mass lesion or obstructive finding. The terminal ileum and appendix are normal. There is a metallic clip in the cecum possibly related to a prior colonoscopy and biopsy. Stable significant age advanced sigmoid colon diverticulosis. Vascular/Lymphatic: Scattered atherosclerotic calcifications involving the aorta and iliac arteries. Right femoral artery stents are noted. No mesenteric or retroperitoneal  mass or adenopathy. Reproductive: The prostate gland and seminal vesicles are unremarkable. Other: No pelvic mass or adenopathy. No free pelvic fluid collections. No inguinal mass or adenopathy. No abdominal wall hernia or subcutaneous lesions. Musculoskeletal: No significant bony findings. No lytic or sclerotic bone lesions to suggest metastatic disease. Stable lumbar fusion hardware at L5-S1. IMPRESSION: 1. Stable post treatment changes in the right upper lobe medially with dense radiation fibrosis but no findings to suggest local recurrent tumor. 2. Significant progression of ill-defined ground-glass opacity, interstitial thickening and airspace nodularity in the right upper lobe and upper aspect of the right lower lobe. Findings could reflect an inflammatory or atypical infectious process, drug induced pneumonitis or interstitial spread of tumor. 3. No mediastinal or hilar mass or adenopathy. 4. No findings for abdominal/pelvic metastatic disease or osseous metastatic disease. * Tracking Code: BO * Aortic Atherosclerosis (ICD10-I70.0). Electronically Signed   By: Marijo Sanes M.D.   On: 01/18/2022 14:56

## 2022-04-07 NOTE — Assessment & Plan Note (Addendum)
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  Repeat CT scan showed improvement of patchy ground-glass opacities.  Reviewed and discussed with patient Labs reviewed and discussed with patient. Shared decision was made to resume Durvalumab, proceed this week.

## 2022-04-07 NOTE — Assessment & Plan Note (Signed)
Continue Synthroid 88 mcg daily

## 2022-04-07 NOTE — Assessment & Plan Note (Signed)
Recommend patient to take Calcium 1200mg  daily.

## 2022-04-07 NOTE — Assessment & Plan Note (Addendum)
Continue Plavix and follow up with vascular surgery. Dr.Schnier recently stopped Eliquis

## 2022-04-07 NOTE — Assessment & Plan Note (Signed)
Status post radiation.  02/19/22 MRI showed decreased brain lesion size and edema.  Continue surveillance every 3 months.

## 2022-04-08 ENCOUNTER — Ambulatory Visit: Payer: Medicare Other | Admitting: Oncology

## 2022-04-08 ENCOUNTER — Encounter
Admission: RE | Disposition: A | Discharge status: Home / Self Care | Payer: Self-pay | Source: Home / Self Care | Attending: Vascular Surgery

## 2022-04-08 ENCOUNTER — Ambulatory Visit
Admission: RE | Admit: 2022-04-08 | Discharge: 2022-04-08 | Disposition: A | Discharge status: Home / Self Care | Payer: Medicare Other | Attending: Vascular Surgery | Admitting: Vascular Surgery

## 2022-04-08 ENCOUNTER — Other Ambulatory Visit: Payer: Medicare Other

## 2022-04-08 ENCOUNTER — Other Ambulatory Visit: Payer: Self-pay

## 2022-04-08 ENCOUNTER — Encounter: Payer: Self-pay | Admitting: Vascular Surgery

## 2022-04-08 DIAGNOSIS — Z7902 Long term (current) use of antithrombotics/antiplatelets: Secondary | ICD-10-CM | POA: Insufficient documentation

## 2022-04-08 DIAGNOSIS — I70221 Atherosclerosis of native arteries of extremities with rest pain, right leg: Secondary | ICD-10-CM | POA: Insufficient documentation

## 2022-04-08 DIAGNOSIS — J449 Chronic obstructive pulmonary disease, unspecified: Secondary | ICD-10-CM | POA: Diagnosis not present

## 2022-04-08 DIAGNOSIS — E782 Mixed hyperlipidemia: Secondary | ICD-10-CM | POA: Diagnosis not present

## 2022-04-08 DIAGNOSIS — I70229 Atherosclerosis of native arteries of extremities with rest pain, unspecified extremity: Secondary | ICD-10-CM

## 2022-04-08 DIAGNOSIS — I70321 Atherosclerosis of unspecified type of bypass graft(s) of the extremities with rest pain, right leg: Secondary | ICD-10-CM

## 2022-04-08 DIAGNOSIS — T82868A Thrombosis of vascular prosthetic devices, implants and grafts, initial encounter: Secondary | ICD-10-CM | POA: Diagnosis not present

## 2022-04-08 DIAGNOSIS — I700 Atherosclerosis of aorta: Secondary | ICD-10-CM

## 2022-04-08 DIAGNOSIS — K219 Gastro-esophageal reflux disease without esophagitis: Secondary | ICD-10-CM | POA: Diagnosis not present

## 2022-04-08 HISTORY — DX: Chronic obstructive pulmonary disease, unspecified: J44.9

## 2022-04-08 HISTORY — PX: LOWER EXTREMITY ANGIOGRAPHY: CATH118251

## 2022-04-08 LAB — CREATININE, SERUM
Creatinine, Ser: 0.9 mg/dL (ref 0.61–1.24)
GFR, Estimated: >60 mL/min (ref >60)

## 2022-04-08 LAB — BUN: BUN: 19 mg/dL (ref 8–23)

## 2022-04-08 SURGERY — LOWER EXTREMITY ANGIOGRAPHY
Anesthesia: Moderate Sedation | Site: Leg Lower | Laterality: Right

## 2022-04-08 MED ORDER — LABETALOL HCL 5 MG/ML IV SOLN: 10.0000 mg | INTRAVENOUS | Status: DC | PRN

## 2022-04-08 MED ORDER — MIDAZOLAM HCL 2 MG/2ML IJ SOLN
INTRAMUSCULAR | Status: AC
  Filled 2022-04-08: qty 2

## 2022-04-08 MED ORDER — SODIUM CHLORIDE 0.9 % IV SOLN: INTRAVENOUS | Status: DC

## 2022-04-08 MED ORDER — HEPARIN SOD (PORK) LOCK FLUSH 100 UNIT/ML IV SOLN: 500.0000 [IU] | Freq: Once | INTRAVENOUS | Status: AC

## 2022-04-08 MED ORDER — IODIXANOL 320 MG/ML IV SOLN
INTRAVENOUS | Status: DC | PRN
  Administered 2022-04-08: 75 mL via INTRA_ARTERIAL

## 2022-04-08 MED ORDER — ACETAMINOPHEN 325 MG PO TABS: 650.0000 mg | ORAL_TABLET | ORAL | Status: DC | PRN

## 2022-04-08 MED ORDER — HYDRALAZINE HCL 20 MG/ML IJ SOLN: 5.0000 mg | INTRAMUSCULAR | Status: DC | PRN

## 2022-04-08 MED ORDER — CEFAZOLIN SODIUM-DEXTROSE 2-4 GM/100ML-% IV SOLN
INTRAVENOUS | Status: AC
  Administered 2022-04-08: 2 g via INTRAVENOUS
  Filled 2022-04-08: qty 100

## 2022-04-08 MED ORDER — HEPARIN SOD (PORK) LOCK FLUSH 100 UNIT/ML IV SOLN
INTRAVENOUS | Status: AC
  Administered 2022-04-08: 500 [IU] via INTRAVENOUS
  Filled 2022-04-08: qty 5

## 2022-04-08 MED ORDER — METHYLPREDNISOLONE SODIUM SUCC 125 MG IJ SOLR: 125.0000 mg | Freq: Once | INTRAMUSCULAR | Status: DC | PRN

## 2022-04-08 MED ORDER — ONDANSETRON HCL 4 MG/2ML IJ SOLN: 4.0000 mg | Freq: Four times a day (QID) | INTRAMUSCULAR | Status: DC | PRN

## 2022-04-08 MED ORDER — HEPARIN SODIUM (PORCINE) 1000 UNIT/ML IJ SOLN
INTRAMUSCULAR | Status: DC | PRN
  Administered 2022-04-08: 6000 [IU] via INTRAVENOUS

## 2022-04-08 MED ORDER — MIDAZOLAM HCL 2 MG/2ML IJ SOLN
INTRAMUSCULAR | Status: DC | PRN
  Administered 2022-04-08: 2 mg via INTRAVENOUS
  Administered 2022-04-08: .5 mg via INTRAVENOUS

## 2022-04-08 MED ORDER — SODIUM CHLORIDE 0.9% FLUSH: 3.0000 mL | INTRAVENOUS | Status: DC | PRN

## 2022-04-08 MED ORDER — SODIUM CHLORIDE 0.9 % IV SOLN: 250.0000 mL | INTRAVENOUS | Status: DC | PRN

## 2022-04-08 MED ORDER — OXYCODONE HCL 5 MG PO TABS: 5.0000 mg | ORAL_TABLET | ORAL | Status: DC | PRN

## 2022-04-08 MED ORDER — MORPHINE SULFATE (PF) 4 MG/ML IV SOLN: 2.0000 mg | INTRAVENOUS | Status: DC | PRN

## 2022-04-08 MED ORDER — CEFAZOLIN SODIUM-DEXTROSE 2-4 GM/100ML-% IV SOLN: 2.0000 g | INTRAVENOUS | Status: AC

## 2022-04-08 MED ORDER — HEPARIN SODIUM (PORCINE) 1000 UNIT/ML IJ SOLN
INTRAMUSCULAR | Status: AC
  Filled 2022-04-08: qty 10

## 2022-04-08 MED ORDER — HYDROMORPHONE HCL 1 MG/ML IJ SOLN: 1.0000 mg | Freq: Once | INTRAMUSCULAR | Status: DC | PRN

## 2022-04-08 MED ORDER — DIPHENHYDRAMINE HCL 50 MG/ML IJ SOLN: 50.0000 mg | Freq: Once | INTRAMUSCULAR | Status: DC | PRN

## 2022-04-08 MED ORDER — MIDAZOLAM HCL 2 MG/ML PO SYRP: 8.0000 mg | ORAL_SOLUTION | Freq: Once | ORAL | Status: DC | PRN

## 2022-04-08 MED ORDER — FENTANYL CITRATE (PF) 100 MCG/2ML IJ SOLN
INTRAMUSCULAR | Status: AC
  Filled 2022-04-08: qty 2

## 2022-04-08 MED ORDER — SODIUM CHLORIDE 0.9% FLUSH: 3.0000 mL | Freq: Two times a day (BID) | INTRAVENOUS | Status: DC

## 2022-04-08 MED ORDER — FAMOTIDINE 20 MG PO TABS: 40.0000 mg | ORAL_TABLET | Freq: Once | ORAL | Status: DC | PRN

## 2022-04-08 MED ORDER — FENTANYL CITRATE (PF) 100 MCG/2ML IJ SOLN
INTRAMUSCULAR | Status: DC | PRN
  Administered 2022-04-08: 50 ug via INTRAVENOUS
  Administered 2022-04-08: 25 ug via INTRAVENOUS

## 2022-04-08 SURGICAL SUPPLY — 22 items
BALLN LUTONIX 018 4X40X130 (BALLOONS) ×1
BALLN LUTONIX 018 5X40X130 (BALLOONS) ×1
BALLOON LUTONIX 018 4X40X130 (BALLOONS) IMPLANT
BALLOON LUTONIX 018 5X40X130 (BALLOONS) IMPLANT
CANNULA 5F STIFF (CANNULA) IMPLANT
CATH ANGIO 5F PIGTAIL 65CM (CATHETERS) IMPLANT
CATH BEACON 5 .035 65 KMP TIP (CATHETERS) IMPLANT
CATH ROTAREX 135 6FR (CATHETERS) IMPLANT
DEVICE STARCLOSE SE CLOSURE (Vascular Products) IMPLANT
GLIDEWIRE ADV .035X260CM (WIRE) IMPLANT
KIT ENCORE 26 ADVANTAGE (KITS) IMPLANT
NDL ENTRY 21GA 7CM ECHOTIP (NEEDLE) IMPLANT
NEEDLE ENTRY 21GA 7CM ECHOTIP (NEEDLE) ×1 IMPLANT
PACK ANGIOGRAPHY (CUSTOM PROCEDURE TRAY) ×1 IMPLANT
SET INTRO CAPELLA COAXIAL (SET/KITS/TRAYS/PACK) IMPLANT
SHEATH ANL2 6FRX45 HC (SHEATH) IMPLANT
SHEATH BRITE TIP 5FRX11 (SHEATH) IMPLANT
SHEATH PROBE COVER 6X72 (BAG) IMPLANT
SYR MEDRAD MARK 7 150ML (SYRINGE) IMPLANT
TUBING CONTRAST HIGH PRESS 72 (TUBING) IMPLANT
WIRE G V18X300CM (WIRE) IMPLANT
WIRE GUIDERIGHT .035X150 (WIRE) IMPLANT

## 2022-04-08 NOTE — Progress Notes (Signed)
MRN : [id number]   Joshua [name] is a 66 y.o. ([birth year]) male who presents with chief complaint of check circulation.   History of Present Illness:    The patient was last seen in the office for followup and review status post angiogram with intervention on 09/25/2021 and 09/26/2021.  Since that encounter at which although he had significant symptoms he was denying rest pain the patient has contacted the office saying that his pain level has increased substantially.   Procedure:  Percutaneous transluminal angioplasty of the proximal peroneal and tibioperoneal trunk to 2.5 mm with an ultra score balloon 2.    Percutaneous transluminal angioplasty of the femoral artery and the proximal anastomosis of the bypass graft to 4 mm with an ultra score balloon 3.    Administration of 6 mg of tPA throughout the entire bypass and in the peroneal artery 4.     Initiation of continuous tPA infusion right lower extremity for limb salvage    Percutaneous transluminal angioplasty of the right posterior tibial artery with 2.5 mm diameter by 30 cm length angioplasty balloon 2.    Percutaneous transluminal angioplasty of the distal bypass anastomosis and distal bypass graft analogous to the popliteal artery with 4 mm diameter by 8 cm length Lutonix drug-coated angioplasty balloon   The patient notes about a month ago he experienced fairly abrupt worsening of his right lower extremity symptoms. + interval shortening of the patient's claudication distance but on this occasion he is denying any rest pain symptoms. No new ulcers or wounds have occurred since the last visit.   There have been no significant changes to the patient's overall health care.   No documented history of amaurosis fugax or recent TIA symptoms. There are no recent neurological changes noted. No documented history of DVT,  PE or superficial thrombophlebitis. The patient denies recent episodes of angina or shortness of breath.    ABI's Rt=0.28 and Lt=1.21  (previous ABI's Rt=1.06 and Lt=1.25) Duplex US of the right lower extremity arterial system shows reocclusion of the Pham distal bypass graft   Active Medications  No outpatient medications have been marked as taking for the 03/10/22 encounter (Appointment) with Delana Meyer, Dolores Lory, MD.            Past Medical History:  Diagnosis Date   Allergy     Cancer [facility])      lung   Dyspnea      former smoker. only doe   History of kidney stones     Hypertension     Peripheral vascular disease ([place])     Pre-diabetes     Sleep apnea             Past Surgical History:  Procedure Laterality Date   ANTERIOR CERVICAL DECOMP/DISCECTOMY FUSION N/A 03/14/2020    Procedure: ANTERIOR CERVICAL  DECOMPRESSION/DISCECTOMY FUSION 3 LEVELS C4-7;  Surgeon: Meade Maw, MD;  Location: [facility] ORS;  Service: Neurosurgery;  Laterality: N/A;   BACK SURGERY   2011   CENTRAL LINE INSERTION Right 09/10/2016    Procedure: CENTRAL LINE INSERTION;  Surgeon: Katha Cabal, MD;  Location: [facility] ORS;  Service: Vascular;  Laterality: Right;   CERVICAL FUSION        C4-c7 on Mar 14, 2020   COLONOSCOPY   2013 ?   COLONOSCOPY N/A 09/09/2021    Procedure: COLONOSCOPY;  Surgeon: [name], DO;  Location: [facility] ENDOSCOPY;  Service: Gastroenterology;  Laterality: N/A;   CORONARY ANGIOPLASTY       ESOPHAGOGASTRODUODENOSCOPY N/A 09/09/2021    Procedure: ESOPHAGOGASTRODUODENOSCOPY (EGD);  Surgeon: [name], DO;  Location: [facility] ENDOSCOPY;  Service: Gastroenterology;  Laterality: N/A;   FEMORAL-POPLITEAL BYPASS GRAFT Right 09/10/2016    Procedure: BYPASS GRAFT FEMORAL-POPLITEAL ARTERY ( BELOW KNEE );  Surgeon: Katha Cabal, MD;  Location: [facility] ORS;  Service: Vascular;  Laterality: Right;   FEMORAL-TIBIAL BYPASS GRAFT Right 03/23/2020    Procedure: BYPASS GRAFT  RIGHT FEMORAL- DISTAL TIBIAL ARTERY WITH DISTAL FLOW GRAFT;  Surgeon: Katha Cabal, MD;  Location: [facility] ORS;  Service: Vascular;  Laterality: Right;   LEFT HEART CATH AND CORONARY ANGIOGRAPHY Left 05/16/2021    Procedure: LEFT HEART CATH AND CORONARY ANGIOGRAPHY;  Surgeon: Andrez Grime, MD;  Location: [place] CV LAB;  Service: Cardiovascular;  Laterality: Left;   LOWER EXTREMITY ANGIOGRAPHY Right 11/18/2016    Procedure: Lower Extremity Angiography;  Surgeon: Katha Cabal, MD;  Location: [place] [facility];  Service: Cardiovascular;  Laterality: Right;   LOWER EXTREMITY ANGIOGRAPHY Right 12/09/2016    Procedure: Lower Extremity Angiography;  Surgeon: Katha Cabal, MD;  Location: [place] [facility] LAB;  Service: Cardiovascular;  Laterality: Right;   LOWER EXTREMITY ANGIOGRAPHY Right 11/15/2019    Procedure: LOWER EXTREMITY ANGIOGRAPHY;  Surgeon: Katha Cabal, MD;  Location: [place] [facility];  Service: Cardiovascular;  Laterality: Right;   LOWER EXTREMITY ANGIOGRAPHY Right 12/01/2019    Procedure: Lower Extremity Angiography;  Surgeon: Algernon Huxley, MD;  Location: [place] [facility];  Service: Cardiovascular;  Laterality: Right;   LOWER EXTREMITY ANGIOGRAPHY Right 12/02/2019    Procedure: LOWER EXTREMITY ANGIOGRAPHY;  Surgeon: Katha Cabal, MD;  Location: [place] [facility];  Service: Cardiovascular;  Laterality: Right;   LOWER EXTREMITY ANGIOGRAPHY Right 03/23/2020    Procedure: Lower Extremity Angiography;  Surgeon: Katha Cabal, MD;  Location: [place] [facility];  Service: Cardiovascular;  Laterality: Right;   LOWER EXTREMITY ANGIOGRAPHY Right 09/25/2021    Procedure: Lower Extremity Angiography;  Surgeon: Katha Cabal, MD;  Location: [place] [facility];  Service: Cardiovascular;  Laterality: Right;   LOWER EXTREMITY ANGIOGRAPHY Right 09/26/2021    Procedure: Lower Extremity Angiography;  Surgeon: Algernon Huxley, MD;  Location: [place] [facility]  LAB;  Service: Cardiovascular;  Laterality: Right;   LOWER EXTREMITY INTERVENTION   11/18/2016    Procedure: Lower Extremity Intervention;  Surgeon: Katha Cabal, MD;  Location: [place] [facility] LAB;  Service: Cardiovascular;;   PERIPHERAL VASCULAR CATHETERIZATION Right 01/02/2015    Procedure: Lower Extremity Angiography;  Surgeon: Katha Cabal, MD;  Location: [place] [facility];  Service: Cardiovascular;  Laterality: Right;   PERIPHERAL VASCULAR CATHETERIZATION Right 06/18/2015    Procedure: Lower Extremity Angiography;  Surgeon: Algernon Huxley, MD;  Location: [place] [facility];  Service:  Cardiovascular;  Laterality: Right;   PERIPHERAL VASCULAR CATHETERIZATION   06/18/2015    Procedure: Lower Extremity Intervention;  Surgeon: Algernon Huxley, MD;  Location: [place] [facility];  Service: Cardiovascular;;   PERIPHERAL VASCULAR CATHETERIZATION N/A 10/09/2015    Procedure: Abdominal Aortogram w/Lower Extremity;  Surgeon: Katha Cabal, MD;  Location: [place] [facility];  Service: Cardiovascular;  Laterality: N/A;   PERIPHERAL VASCULAR CATHETERIZATION   10/09/2015    Procedure: Lower Extremity Intervention;  Surgeon: Katha Cabal, MD;  Location: [place] [facility];  Service: Cardiovascular;;   PERIPHERAL VASCULAR CATHETERIZATION Right 10/10/2015    Procedure: Lower Extremity Angiography;  Surgeon: Algernon Huxley, MD;  Location: [place] [facility];  Service: Cardiovascular;  Laterality: Right;   PERIPHERAL VASCULAR CATHETERIZATION Right 10/10/2015    Procedure: Lower Extremity Intervention;  Surgeon: Algernon Huxley, MD;  Location: [place] [facility];  Service: Cardiovascular;  Laterality: Right;   PERIPHERAL VASCULAR CATHETERIZATION Right 12/03/2015    Procedure: Lower Extremity Angiography;  Surgeon: Algernon Huxley, MD;  Location: [place] [facility];  Service: Cardiovascular;  Laterality: Right;   PERIPHERAL VASCULAR CATHETERIZATION Right 12/04/2015    Procedure: Lower Extremity  Angiography;  Surgeon: Algernon Huxley, MD;  Location: [place] [facility];  Service: Cardiovascular;  Laterality: Right;   PERIPHERAL VASCULAR CATHETERIZATION   12/04/2015    Procedure: Lower Extremity Intervention;  Surgeon: Algernon Huxley, MD;  Location: [place] [facility];  Service: Cardiovascular;;   PERIPHERAL VASCULAR CATHETERIZATION Right 06/30/2016    Procedure: Lower Extremity Angiography;  Surgeon: Algernon Huxley, MD;  Location: [place] [facility];  Service: Cardiovascular;  Laterality: Right;   PERIPHERAL VASCULAR CATHETERIZATION Right 06/25/2016    Procedure: Lower Extremity Angiography;  Surgeon: Katha Cabal, MD;  Location: [place] [facility];  Service: Cardiovascular;  Laterality: Right;   PERIPHERAL VASCULAR CATHETERIZATION Right 07/01/2016    Procedure: Lower Extremity Angiography;  Surgeon: Katha Cabal, MD;  Location: [place] [facility];  Service: Cardiovascular;  Laterality: Right;   PERIPHERAL VASCULAR CATHETERIZATION   07/01/2016    Procedure: Lower Extremity Intervention;  Surgeon: Katha Cabal, MD;  Location: [place] [facility];  Service: Cardiovascular;;   PERIPHERAL VASCULAR CATHETERIZATION Right 08/01/2016    Procedure: Lower Extremity Angiography;  Surgeon: Katha Cabal, MD;  Location: [place] [facility];  Service: Cardiovascular;  Laterality: Right;   PORTA CATH INSERTION N/A 10/05/2020    Procedure: PORTA CATH INSERTION;  Surgeon: Katha Cabal, MD;  Location: [place] [facility];  Service: Cardiovascular;  Laterality: N/A;   stent placement in right leg Right     VIDEO BRONCHOSCOPY N/A 09/14/2020    Procedure: VIDEO BRONCHOSCOPY WITH FLUORO;  Surgeon: Tyler Pita, MD;  Location: [facility] ORS;  Service: Cardiopulmonary;  Laterality: N/A;   VIDEO BRONCHOSCOPY WITH ENDOBRONCHIAL ULTRASOUND N/A 09/14/2020    Procedure: VIDEO BRONCHOSCOPY WITH ENDOBRONCHIAL ULTRASOUND;  Surgeon: Tyler Pita, MD;  Location: [facility] ORS;  Service:  Cardiopulmonary;  Laterality: N/A;      Social History Social History         Tobacco Use   Smoking status: Former      Packs/day: 2.00      Years: 38.00      Total pack years: 76.00      Types: Cigarettes      Quit date: 12/20/2014      Years since quitting: 7.2   Smokeless tobacco: Never   Tobacco  comments:      quit smoking in 2017 most smoked 2   Vaping Use   Vaping Use: Former  Substance Use Topics   Alcohol use: Not Currently   Drug use: No      Family History      Family History  Problem Relation Age of Onset   Diabetes Mother     Hypertension Mother     Heart murmur Mother     Leukemia Mother     Throat cancer Maternal Grandmother     Colon cancer Neg Hx     Breast cancer Neg Hx        No Active Allergies     REVIEW OF SYSTEMS (Negative unless checked)   Constitutional: [] Weight loss  [] Fever  [] Chills Cardiac: [] Chest pain   [] Chest pressure   [] Palpitations   [] Shortness of breath when laying flat   [] Shortness of breath with exertion. Vascular:  [x] Pain in legs with walking   [] Pain in legs at rest  [] History of DVT   [] Phlebitis   [] Swelling in legs   [] Varicose veins   [] Non-healing ulcers Pulmonary:   [] Uses home oxygen   [] Productive cough   [] Hemoptysis   [] Wheeze  [x] COPD   [] Asthma Neurologic:  [] Dizziness   [] Seizures   [] History of stroke   [] History of TIA  [] Aphasia   [] Vissual changes   [] Weakness or numbness in arm   [] Weakness or numbness in leg Musculoskeletal:   [] Joint swelling   [] Joint pain   [] Low back pain Hematologic:  [] Easy bruising  [] Easy bleeding   [] Hypercoagulable state   [] Anemic Gastrointestinal:  [] Diarrhea   [] Vomiting  [x] Gastroesophageal reflux/heartburn   [] Difficulty swallowing. Genitourinary:  [] Chronic kidney disease   [] Difficult urination  [] Frequent urination   [] Blood in urine Skin:  [] Rashes   [] Ulcers  Psychological:  [] History of anxiety   []  History of major depression.   Physical Examination   There were  no vitals filed for this visit. There is no height or weight on file to calculate BMI. Gen: WD/WN, NAD Head: [place]/AT, No temporalis wasting.  Ear/Nose/Throat: Hearing grossly intact, nares w/o erythema or drainage Eyes: PER, EOMI, sclera nonicteric.  Neck: Supple, no masses.  No bruit or JVD.  Pulmonary:  Good air movement, no audible wheezing, no use of accessory muscles.  Cardiac: RRR, normal S1, S2, no Murmurs. Vascular:  mild trophic changes, no open wounds Vessel Right Left  Radial Palpable Palpable  PT Not Palpable Palpable  DP Not Palpable Palpable  Gastrointestinal: soft, non-distended. No guarding/no peritoneal signs.  Musculoskeletal: M/S 5/5 throughout.  No visible deformity.  Neurologic: CN 2-12 intact. Pain and light touch intact in extremities.  Symmetrical.  Speech is fluent. Motor exam as listed above. Psychiatric: Judgment intact, Mood & affect appropriate for pt's clinical situation. Dermatologic: No rashes or ulcers noted.  No changes consistent with cellulitis.     CBC Recent Labs       Lab Results  Component Value Date    WBC 5.3 03/05/2022    HGB 12.5 (L) 03/05/2022    HCT 40.1 03/05/2022    MCV 89.5 03/05/2022    PLT 140 (L) 03/05/2022        BMET Labs (Brief)          Component Value Date/Time    NA 142 03/05/2022 1412    NA 141 10/14/2019 1101    K 4.1 03/05/2022 1412    CL 105 03/05/2022 1412    CO2  30 03/05/2022 1412    GLUCOSE 76 03/05/2022 1412    BUN 20 03/05/2022 1412    BUN 15 10/14/2019 1101    BUN 16 07/03/2014 1205    CREATININE 1.10 03/05/2022 1412    CREATININE 1.34 (H) 07/03/2014 1205    CALCIUM 9.2 03/05/2022 1412    GFRNONAA >60 03/05/2022 1412    GFRNONAA 59 (L) 07/03/2014 1205    GFRAA >60 03/30/2020 0536    GFRAA >60 07/03/2014 1205      Estimated Creatinine Clearance: 81.5 mL/min (by C-G formula based on SCr of 1.1 mg/dL).   COAG Recent Labs       Lab Results  Component Value Date    INR 1.2 09/26/2021    INR  1.2 08/25/2020    INR 1.0 03/05/2020        Radiology  Imaging Results  MR Brain W Wo Contrast   Result Date: 02/19/2022 CLINICAL DATA:  Lung cancer with metastatic disease. Assess response to treatment. EXAM: MRI HEAD WITHOUT AND WITH CONTRAST TECHNIQUE: Multiplanar, multiecho pulse sequences of the brain and surrounding structures were obtained without and with intravenous contrast. CONTRAST:  18mL GADAVIST GADOBUTROL 1 MMOL/ML IV SOLN COMPARISON:  MRI head 12/10/2021 FINDINGS: Brain: 6 mm enhancing lesion left frontal parietal lobe has improved. Mild amount of surrounding edema has improved. Subtle enhancing lesion in the high left parietal lobe, 18/157 appears smaller and shows minimal enhancement. Enhancing lesion right occipital lobe measures approximately 3 mm and is smaller. No new lesions identified. No acute infarct. Patchy white matter hyperintensities bilaterally consistent with chronic microvascular ischemia. Negative for hemorrhage or hydrocephalus. Vascular: Normal arterial flow voids at the skull base. Skull and upper cervical spine: Negative Sinuses/Orbits: Mild mucosal edema paranasal sinuses. 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 Decreased edema in the left parietal lobe. No new lesions. Electronically Signed   By: Franchot Gallo M.D.   On: 02/19/2022 18:09    DG Chest 2 View   Result Date: 02/11/2022 CLINICAL DATA:  History of pneumonitis.  Follow-up. EXAM: CHEST - 2 VIEW COMPARISON:  CT C AP January 17, 2022; chest radiograph January 14, 2021 FINDINGS: Right anterior chest wall Port-A-Cath is present with tip projecting over the superior vena cava. Redemonstrated right suprahilar masslike opacity. Scattered heterogeneous opacities right mid and lower lung. No pleural effusion or pneumothorax. Thoracic spine degenerative changes. IMPRESSION: Redemonstrated right mid and lower lung airspace opacities which may represent infectious  process in the appropriate clinical setting. Alternative etiologies such as interstitial spread of tumor not excluded. Consider short-term follow-up chest CT. Electronically Signed   By: Lovey Newcomer M.D.   On: 02/11/2022 11:00        Assessment/Plan 1. Atherosclerosis of lower extremity with claudication ([place])  Recommend:   The patient has evidence of atherosclerosis of the lower extremities with claudication.  The patient does note he has experienced increasing claudication but on this occasion he has not experienced return of his rest pain symptoms.  He states that he is actually able to walk around enough to carry out his activities of daily living.  This is certainly a significant difference compared to the multiple previous times he is thrombosed.   Noninvasive studies do suggest clinically significant change with reocclusion of the right femoral distal bypass graft.   The patient has now contacted the office saying he is having creasing symptoms and therefore angiography is warranted to see if there is any possible surgical  reconstruction.  I do not think that attempting to salvage his previous bypass will provide a durable reconstruction given the numerous interventions that have already been performed and the short duration of patency that we have experienced over the last several treatments.  The patient should continue walking and begin a more formal exercise program.  The patient should continue antiplatelet therapy and aggressive treatment of the lipid abnormalities   Patient is voicing concern regarding the cost of Eliquis.  Given that his bypass is occluded I have decided to stop the Eliquis he will continue his Plavix and he will begin taking aspirin 81 mg daily.   We will move forward with right leg angiography.  Risks and benefits are reviewed all questions answered patient has agreed to proceed   The patient will continue follow up with noninvasive studies as ordered.   - VAS  [place] ABI WITH/WO TBI; Future   2. Chronic obstructive pulmonary disease, unspecified COPD type ([place]) Continue pulmonary medications and aerosols as already ordered, these medications have been reviewed and there are no changes at this time.     3. Gastroesophageal reflux disease without esophagitis Continue PPI as already ordered, this medication has been reviewed and there are no changes at this time.   Avoidence of caffeine and alcohol   Moderate elevation of the head of the bed     4. Mixed hyperlipidemia Continue statin as ordered and reviewed, no changes at this time        [name], MD   03/09/2022 4:04 PM

## 2022-04-08 NOTE — H&P (View-Only) (Signed)
MRN : [id number]   Joshua [name] is a 66 y.o. (1956/04/08) male who presents with chief complaint of check circulation.   History of Present Illness:    The patient was last seen in the office for followup and review status post angiogram with intervention on 09/25/2021 and 09/26/2021.  Since that encounter at which although he had significant symptoms he was denying rest pain the patient has contacted the office saying that his pain level has increased substantially.   Procedure:  Percutaneous transluminal angioplasty of the proximal peroneal and tibioperoneal trunk to 2.5 mm with an ultra score balloon 2.    Percutaneous transluminal angioplasty of the femoral artery and the proximal anastomosis of the bypass graft to 4 mm with an ultra score balloon 3.    Administration of 6 mg of tPA throughout the entire bypass and in the peroneal artery 4.     Initiation of continuous tPA infusion right lower extremity for limb salvage    Percutaneous transluminal angioplasty of the right posterior tibial artery with 2.5 mm diameter by 30 cm length angioplasty balloon 2.    Percutaneous transluminal angioplasty of the distal bypass anastomosis and distal bypass graft analogous to the popliteal artery with 4 mm diameter by 8 cm length Lutonix drug-coated angioplasty balloon   The patient notes about a month ago he experienced fairly abrupt worsening of his right lower extremity symptoms. + interval shortening of the patient's claudication distance but on this occasion he is denying any rest pain symptoms. No new ulcers or wounds have occurred since the last visit.   There have been no significant changes to the patient's overall health care.   No documented history of amaurosis fugax or recent TIA symptoms. There are no recent neurological changes noted. No documented history of DVT,  PE or superficial thrombophlebitis. The patient denies recent episodes of angina or shortness of breath.    ABI's Rt=0.28 and Lt=1.21  (previous ABI's Rt=1.06 and Lt=1.25) Duplex US of the right lower extremity arterial system shows reocclusion of the Pham distal bypass graft   Active Medications  No outpatient medications have been marked as taking for the 03/10/22 encounter (Appointment) with Delana Meyer, Dolores Lory, MD.            Past Medical History:  Diagnosis Date   Allergy     Cancer [facility])      lung   Dyspnea      former smoker. only doe   History of kidney stones     Hypertension     Peripheral vascular disease ([place])     Pre-diabetes     Sleep apnea             Past Surgical History:  Procedure Laterality Date   ANTERIOR CERVICAL DECOMP/DISCECTOMY FUSION N/A 03/14/2020    Procedure: ANTERIOR CERVICAL  DECOMPRESSION/DISCECTOMY FUSION 3 LEVELS C4-7;  Surgeon: Meade Maw, MD;  Location: [facility] ORS;  Service: Neurosurgery;  Laterality: N/A;   BACK SURGERY   2011   CENTRAL LINE INSERTION Right 09/10/2016    Procedure: CENTRAL LINE INSERTION;  Surgeon: Katha Cabal, MD;  Location: [facility];  Service: Vascular;  Laterality: Right;   CERVICAL FUSION        C4-c7 on Mar 14, 2020   COLONOSCOPY   2013 ?   COLONOSCOPY N/A 09/09/2021    Procedure: COLONOSCOPY;  Surgeon: [name], DO;  Location: [facility] END[facility]COPY;  Service: Gastroenterology;  Laterality: N/A;   CORONARY ANGIOPLASTY       ESOPHAGOGASTRODUODENOSCOPY N/A 09/09/2021    Procedure: ESOPHAGOGASTRODUODENOSCOPY (EGD);  Surgeon: [name], DO;  Location: [facility];  Service: Gastroenterology;  Laterality: N/A;   FEMORAL-POPLITEAL BYPASS GRAFT Right 09/10/2016    Procedure: BYPASS GRAFT FEMORAL-POPLITEAL ARTERY ( BELOW KNEE );  Surgeon: Katha Cabal, MD;  Location: [facility];  Service: Vascular;  Laterality: Right;   FEMORAL-TIBIAL BYPASS GRAFT Right 03/23/2020    Procedure: BYPASS GRAFT  RIGHT FEMORAL- DISTAL TIBIAL ARTERY WITH DISTAL FLOW GRAFT;  Surgeon: Katha Cabal, MD;  Location: [facility];  Service: Vascular;  Laterality: Right;   LEFT HEART CATH AND CORONARY ANGIOGRAPHY Left 05/16/2021    Procedure: LEFT HEART CATH AND CORONARY ANGIOGRAPHY;  Surgeon: Andrez Grime, MD;  Location: [place] [facility];  Service: Cardiovascular;  Laterality: Left;   LOWER EXTREMITY ANGIOGRAPHY Right 11/18/2016    Procedure: Lower Extremity Angiography;  Surgeon: Katha Cabal, MD;  Location: [place] [facility];  Service: Cardiovascular;  Laterality: Right;   LOWER EXTREMITY ANGIOGRAPHY Right 12/09/2016    Procedure: Lower Extremity Angiography;  Surgeon: Katha Cabal, MD;  Location: [place] [facility];  Service: Cardiovascular;  Laterality: Right;   LOWER EXTREMITY ANGIOGRAPHY Right 11/15/2019    Procedure: LOWER EXTREMITY ANGIOGRAPHY;  Surgeon: Katha Cabal, MD;  Location: [place] [facility];  Service: Cardiovascular;  Laterality: Right;   LOWER EXTREMITY ANGIOGRAPHY Right 12/01/2019    Procedure: Lower Extremity Angiography;  Surgeon: Algernon Huxley, MD;  Location: [place] [facility];  Service: Cardiovascular;  Laterality: Right;   LOWER EXTREMITY ANGIOGRAPHY Right 12/02/2019    Procedure: LOWER EXTREMITY ANGIOGRAPHY;  Surgeon: Katha Cabal, MD;  Location: [place] [facility];  Service: Cardiovascular;  Laterality: Right;   LOWER EXTREMITY ANGIOGRAPHY Right 03/23/2020    Procedure: Lower Extremity Angiography;  Surgeon: Katha Cabal, MD;  Location: [place] [facility];  Service: Cardiovascular;  Laterality: Right;   LOWER EXTREMITY ANGIOGRAPHY Right 09/25/2021    Procedure: Lower Extremity Angiography;  Surgeon: Katha Cabal, MD;  Location: [place] [facility];  Service: Cardiovascular;  Laterality: Right;   LOWER EXTREMITY ANGIOGRAPHY Right 09/26/2021    Procedure: Lower Extremity Angiography;  Surgeon: Algernon Huxley, MD;  Location: [place] [facility];  Service: Cardiovascular;  Laterality: Right;   LOWER EXTREMITY INTERVENTION   11/18/2016    Procedure: Lower Extremity Intervention;  Surgeon: Katha Cabal, MD;  Location: [place] [facility];  Service: Cardiovascular;;   PERIPHERAL VASCULAR CATHETERIZATION Right 01/02/2015    Procedure: Lower Extremity Angiography;  Surgeon: Katha Cabal, MD;  Location: [place] [facility];  Service: Cardiovascular;  Laterality: Right;   PERIPHERAL VASCULAR CATHETERIZATION Right 06/18/2015    Procedure: Lower Extremity Angiography;  Surgeon: Algernon Huxley, MD;  Location: [place] [facility];  Service:  Cardiovascular;  Laterality: Right;   PERIPHERAL VASCULAR CATHETERIZATION   06/18/2015    Procedure: Lower Extremity Intervention;  Surgeon: Algernon Huxley, MD;  Location: [place] [facility];  Service: Cardiovascular;;   PERIPHERAL VASCULAR CATHETERIZATION N/A 10/09/2015    Procedure: Abdominal Aortogram w/Lower Extremity;  Surgeon: Katha Cabal, MD;  Location: [place] [facility];  Service: Cardiovascular;  Laterality: N/A;   PERIPHERAL VASCULAR CATHETERIZATION   10/09/2015    Procedure: Lower Extremity Intervention;  Surgeon: Katha Cabal, MD;  Location: [place] [facility];  Service: Cardiovascular;;   PERIPHERAL VASCULAR CATHETERIZATION Right 10/10/2015    Procedure: Lower Extremity Angiography;  Surgeon: Algernon Huxley, MD;  Location: [place] [facility];  Service: Cardiovascular;  Laterality: Right;   PERIPHERAL VASCULAR CATHETERIZATION Right 10/10/2015    Procedure: Lower Extremity Intervention;  Surgeon: Algernon Huxley, MD;  Location: [place] [facility];  Service: Cardiovascular;  Laterality: Right;   PERIPHERAL VASCULAR CATHETERIZATION Right 12/03/2015    Procedure: Lower Extremity Angiography;  Surgeon: Algernon Huxley, MD;  Location: [place] [facility];  Service: Cardiovascular;  Laterality: Right;   PERIPHERAL VASCULAR CATHETERIZATION Right 12/04/2015    Procedure: Lower Extremity  Angiography;  Surgeon: Algernon Huxley, MD;  Location: [place] [facility];  Service: Cardiovascular;  Laterality: Right;   PERIPHERAL VASCULAR CATHETERIZATION   12/04/2015    Procedure: Lower Extremity Intervention;  Surgeon: Algernon Huxley, MD;  Location: [place] [facility];  Service: Cardiovascular;;   PERIPHERAL VASCULAR CATHETERIZATION Right 06/30/2016    Procedure: Lower Extremity Angiography;  Surgeon: Algernon Huxley, MD;  Location: [place] [facility];  Service: Cardiovascular;  Laterality: Right;   PERIPHERAL VASCULAR CATHETERIZATION Right 06/25/2016    Procedure: Lower Extremity Angiography;  Surgeon: Katha Cabal, MD;  Location: [place] [facility];  Service: Cardiovascular;  Laterality: Right;   PERIPHERAL VASCULAR CATHETERIZATION Right 07/01/2016    Procedure: Lower Extremity Angiography;  Surgeon: Katha Cabal, MD;  Location: [place] [facility];  Service: Cardiovascular;  Laterality: Right;   PERIPHERAL VASCULAR CATHETERIZATION   07/01/2016    Procedure: Lower Extremity Intervention;  Surgeon: Katha Cabal, MD;  Location: [place] [facility];  Service: Cardiovascular;;   PERIPHERAL VASCULAR CATHETERIZATION Right 08/01/2016    Procedure: Lower Extremity Angiography;  Surgeon: Katha Cabal, MD;  Location: [place] [facility];  Service: Cardiovascular;  Laterality: Right;   PORTA CATH INSERTION N/A 10/05/2020    Procedure: PORTA CATH INSERTION;  Surgeon: Katha Cabal, MD;  Location: [place] [facility];  Service: Cardiovascular;  Laterality: N/A;   stent placement in right leg Right     VIDEO BRONCHOSCOPY N/A 09/14/2020    Procedure: VIDEO BRONCHOSCOPY WITH FLUORO;  Surgeon: Tyler Pita, MD;  Location: [facility];  Service: Cardiopulmonary;  Laterality: N/A;   VIDEO BRONCHOSCOPY WITH ENDOBRONCHIAL ULTRASOUND N/A 09/14/2020    Procedure: VIDEO BRONCHOSCOPY WITH ENDOBRONCHIAL ULTRASOUND;  Surgeon: Tyler Pita, MD;  Location: [facility];  Service:  Cardiopulmonary;  Laterality: N/A;      Social History Social History         Tobacco Use   Smoking status: Former      Packs/day: 2.00      Years: 38.00      Total pack years: 76.00      Types: Cigarettes      Quit date: 12/20/2014      Years since quitting: 7.2   Smokeless tobacco: Never   Tobacco  comments:      quit smoking in 2017 most smoked 2   Vaping Use   Vaping Use: Former  Substance Use Topics   Alcohol use: Not Currently   Drug use: No      Family History      Family History  Problem Relation Age of Onset   Diabetes Mother     Hypertension Mother     Heart murmur Mother     Leukemia Mother     Throat cancer Maternal Grandmother     Colon cancer Neg Hx     Breast cancer Neg Hx        No Active Allergies     REVIEW OF SYSTEMS (Negative unless checked)   Constitutional: [] Weight loss  [] Fever  [] Chills Cardiac: [] Chest pain   [] Chest pressure   [] Palpitations   [] Shortness of breath when laying flat   [] Shortness of breath with exertion. Vascular:  [x] Pain in legs with walking   [] Pain in legs at rest  [] History of DVT   [] Phlebitis   [] Swelling in legs   [] Varicose veins   [] Non-healing ulcers Pulmonary:   [] Uses home oxygen   [] Productive cough   [] Hemoptysis   [] Wheeze  [x] COPD   [] Asthma Neurologic:  [] Dizziness   [] Seizures   [] History of stroke   [] History of TIA  [] Aphasia   [] Vissual changes   [] Weakness or numbness in arm   [] Weakness or numbness in leg Musculoskeletal:   [] Joint swelling   [] Joint pain   [] Low back pain Hematologic:  [] Easy bruising  [] Easy bleeding   [] Hypercoagulable state   [] Anemic Gastrointestinal:  [] Diarrhea   [] Vomiting  [x] Gastroesophageal reflux/heartburn   [] Difficulty swallowing. Genitourinary:  [] Chronic kidney disease   [] Difficult urination  [] Frequent urination   [] Blood in urine Skin:  [] Rashes   [] Ulcers  Psychological:  [] History of anxiety   []  History of major depression.   Physical Examination   There were  no vitals filed for this visit. There is no height or weight on file to calculate BMI. Gen: WD/WN, NAD Head: [place]/AT, No temporalis wasting.  Ear/Nose/Throat: Hearing grossly intact, nares w/o erythema or drainage Eyes: PER, EOMI, sclera nonicteric.  Neck: Supple, no masses.  No bruit or JVD.  Pulmonary:  Good air movement, no audible wheezing, no use of accessory muscles.  Cardiac: RRR, normal S1, S2, no Murmurs. Vascular:  mild trophic changes, no open wounds Vessel Right Left  Radial Palpable Palpable  PT Not Palpable Palpable  DP Not Palpable Palpable  Gastrointestinal: soft, non-distended. No guarding/no peritoneal signs.  Musculoskeletal: M/S 5/5 throughout.  No visible deformity.  Neurologic: CN 2-12 intact. Pain and light touch intact in extremities.  Symmetrical.  Speech is fluent. Motor exam as listed above. Psychiatric: Judgment intact, Mood & affect appropriate for pt's clinical situation. Dermatologic: No rashes or ulcers noted.  No changes consistent with cellulitis.     CBC Recent Labs       Lab Results  Component Value Date    WBC 5.3 03/05/2022    HGB 12.5 (L) 03/05/2022    HCT 40.1 03/05/2022    MCV 89.5 03/05/2022    PLT 140 (L) 03/05/2022        BMET Labs (Brief)          Component Value Date/Time    NA 142 03/05/2022 1412    NA 141 10/14/2019 1101    K 4.1 03/05/2022 1412    CL 105 03/05/2022 1412    CO2  30 03/05/2022 1412    GLUCOSE 76 03/05/2022 1412    BUN 20 03/05/2022 1412    BUN 15 10/14/2019 1101    BUN 16 07/03/2014 1205    CREATININE 1.10 03/05/2022 1412    CREATININE 1.34 (H) 07/03/2014 1205    CALCIUM 9.2 03/05/2022 1412    GFRNONAA >60 03/05/2022 1412    GFRNONAA 59 (L) 07/03/2014 1205    GFRAA >60 03/30/2020 0536    GFRAA >60 07/03/2014 1205      Estimated Creatinine Clearance: 81.5 mL/min (by C-G formula based on SCr of 1.1 mg/dL).   COAG Recent Labs       Lab Results  Component Value Date    INR 1.2 09/26/2021    INR  1.2 08/25/2020    INR 1.0 03/05/2020        Radiology  Imaging Results  MR Brain W Wo Contrast   Result Date: 02/19/2022 CLINICAL DATA:  Lung cancer with metastatic disease. Assess response to treatment. EXAM: MRI HEAD WITHOUT AND WITH CONTRAST TECHNIQUE: Multiplanar, multiecho pulse sequences of the brain and surrounding structures were obtained without and with intravenous contrast. CONTRAST:  58mL GADAVIST GADOBUTROL 1 MMOL/ML IV SOLN COMPARISON:  MRI head 12/10/2021 FINDINGS: Brain: 6 mm enhancing lesion left frontal parietal lobe has improved. Mild amount of surrounding edema has improved. Subtle enhancing lesion in the high left parietal lobe, 18/157 appears smaller and shows minimal enhancement. Enhancing lesion right occipital lobe measures approximately 3 mm and is smaller. No new lesions identified. No acute infarct. Patchy white matter hyperintensities bilaterally consistent with chronic microvascular ischemia. Negative for hemorrhage or hydrocephalus. Vascular: Normal arterial flow voids at the skull base. Skull and upper cervical spine: Negative Sinuses/Orbits: Mild mucosal edema paranasal sinuses. 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 Decreased edema in the left parietal lobe. No new lesions. Electronically Signed   By: Franchot Gallo M.D.   On: 02/19/2022 18:09    DG Chest 2 View   Result Date: 02/11/2022 CLINICAL DATA:  History of pneumonitis.  Follow-up. EXAM: CHEST - 2 VIEW COMPARISON:  CT C AP January 17, 2022; chest radiograph January 14, 2021 FINDINGS: Right anterior chest wall Port-A-Cath is present with tip projecting over the superior vena cava. Redemonstrated right suprahilar masslike opacity. Scattered heterogeneous opacities right mid and lower lung. No pleural effusion or pneumothorax. Thoracic spine degenerative changes. IMPRESSION: Redemonstrated right mid and lower lung airspace opacities which may represent infectious  process in the appropriate clinical setting. Alternative etiologies such as interstitial spread of tumor not excluded. Consider short-term follow-up chest CT. Electronically Signed   By: Lovey Newcomer M.D.   On: 02/11/2022 11:00        Assessment/Plan 1. Atherosclerosis of lower extremity with claudication ([place])  Recommend:   The patient has evidence of atherosclerosis of the lower extremities with claudication.  The patient does note he has experienced increasing claudication but on this occasion he has not experienced return of his rest pain symptoms.  He states that he is actually able to walk around enough to carry out his activities of daily living.  This is certainly a significant difference compared to the multiple previous times he is thrombosed.   Noninvasive studies do suggest clinically significant change with reocclusion of the right femoral distal bypass graft.   The patient has now contacted the office saying he is having creasing symptoms and therefore angiography is warranted to see if there is any possible surgical  reconstruction.  I do not think that attempting to salvage his previous bypass will provide a durable reconstruction given the numerous interventions that have already been performed and the short duration of patency that we have experienced over the last several treatments.  The patient should continue walking and begin a more formal exercise program.  The patient should continue antiplatelet therapy and aggressive treatment of the lipid abnormalities   Patient is voicing concern regarding the cost of Eliquis.  Given that his bypass is occluded I have decided to stop the Eliquis he will continue his Plavix and he will begin taking aspirin 81 mg daily.   We will move forward with right leg angiography.  Risks and benefits are reviewed all questions answered patient has agreed to proceed   The patient will continue follow up with noninvasive studies as ordered.   - VAS  [place] ABI WITH/WO TBI; Future   2. Chronic obstructive pulmonary disease, unspecified COPD type (No Name) Continue pulmonary medications and aerosols as already ordered, these medications have been reviewed and there are no changes at this time.     3. Gastroesophageal reflux disease without esophagitis Continue PPI as already ordered, this medication has been reviewed and there are no changes at this time.   Avoidence of caffeine and alcohol   Moderate elevation of the head of the bed     4. Mixed hyperlipidemia Continue statin as ordered and reviewed, no changes at this time        [name], MD   03/09/2022 4:04 PM

## 2022-04-08 NOTE — Interval H&P Note (Signed)
History and Physical Interval Note:  04/08/2022 9:49 AM  Joshua [name]  has presented today for surgery, with the diagnosis of RLE Angio   BARD   ASO w rest pain.  The various methods of treatment have been discussed with the patient and family. After consideration of risks, benefits and other options for treatment, the patient has consented to  Procedure(s): Lower Extremity Angiography (Right) as a surgical intervention.  The patient's history has been reviewed, patient examined, no change in status, stable for surgery.  I have reviewed the patient's chart and labs.  Questions were answered to the patient's satisfaction.     [name]

## 2022-04-08 NOTE — Op Note (Signed)
[facility]  Percutaneous Study/Intervention Procedural Note   Date of Surgery: 04/08/2022  Surgeon:  Katha Cabal, MD.  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  Post-operative diagnosis:  Same  Procedure(s) Performed:             1.  Introduction catheter into right lower extremity 3rd order catheter placement              2.  Contrast injection right lower extremity for distal runoff             3.  Percutaneous transluminal angioplasty right profunda femoris artery              4.  Mechanical thrombectomy of the right profunda femoris artery using the Rota Rex catheter.               5.   Star close closure left common femoral arteriotomy  Anesthesia: Conscious sedation was administered under my direct supervision by the interventional radiology RN. IV Versed plus fentanyl were utilized. Continuous ECG, pulse oximetry and blood pressure was monitored throughout the entire procedure.  Conscious sedation was for a total of 58 minutes.  Sheath: 6 [place] Ansell left common femoral retrograde  Contrast: 75 cc  Fluoroscopy Time: 6.5 minutes  Indications:  Joshua [name] presents with increasing rest pain of the right lower extremity the risks and benefits are reviewed all questions answered patient agrees to proceed.  Procedure:  Joshua [name] is a 66 y.o. y.o. male who was identified and appropriate procedural time out was performed.  The patient was then placed supine on the table and prepped and draped in the usual sterile fashion.    Ultrasound was placed in the sterile sleeve and the left groin was evaluated the left common femoral artery was echolucent and pulsatile indicating patency.  Image was recorded for the permanent record and under real-time visualization a microneedle was inserted into the common femoral artery microwire followed by a micro-sheath.  A J-wire was then  advanced through the micro-sheath and a  5 [place] sheath was then inserted over a J-wire. J-wire was then advanced and a 5 French pigtail catheter was positioned at the level of T12. AP projection of the aorta was then obtained. Pigtail catheter was repositioned to above the bifurcation and a LAO view of the pelvis was obtained.  Subsequently a pigtail catheter with the stiff angle Glidewire was used to cross the aortic bifurcation the catheter wire were advanced down into the right distal external iliac artery. Oblique view of the femoral bifurcation was then obtained and subsequently the wire was reintroduced and the pigtail catheter negotiated into the SFA representing third order catheter placement. Distal runoff was then performed.  Diagnostic interpretation: The abdominal aorta is opacified with a bolus injection contrast. Renal arteries are single and patent. The aorta itself has diffuse disease but no hemodynamically significant lesions. The common and external iliac arteries are widely patent bilaterally.  The right common femoral is widely patent.  The profunda femoris demonstrates a greater than 90% stenosis in the main trunk approximately 4 cm distal to the origin.  The SFA and the previous bypass are occluded.  There is reconstitution of the proximal peroneal as well as the mid posterior tibial.  Both of these from the point of reconstitution are patent down to the foot.  Both are of good caliber and would be suitable targets for distal bypass.  6000 units of heparin was then given and allowed to circulate and a 6 [place] Ansell sheath was advanced up and over the bifurcation and positioned in the femoral artery  KMP  catheter and advantage Glidewire were then negotiated down into the distal profunda femoris artery.  The [place] Rex catheter was then prepared on the field and a V18 wire advanced through the Kumpe catheter.  Kumpe catheter was removed and the [place] Rex was inserted.  The lesion  identified in the main trunk of the profunda femoris artery was then treated with the [place] Rex catheter.  Approximately 4 passes were made.  Follow-up imaging demonstrated substantial improvement in the lumen consistent with a successful thrombectomy but there was still greater than 40% residual stenosis.  A 4 mm x 40 mm Lutonix balloon was used to angioplasty the profunda femoris artery. Inflations were to 10 atmospheres for 1 minute.  Next a 5 mm x 40 mm Lutonix drug-eluting balloon was advanced across the lesion inflated to 8 atm for 1 full minute.  Follow-up imaging demonstrated patency with less than 10% residual stenosis. Distal runoff was then assessed.  After review of these images the sheath is pulled into the left external iliac oblique of the common femoral is obtained and a Star close device deployed. There no immediate complications.   Findings:  The abdominal aorta is opacified with a bolus injection contrast. Renal arteries are single and patent. The aorta itself has diffuse disease but no hemodynamically significant lesions. The common and external iliac arteries are widely patent bilaterally.  The right common femoral is widely patent.  The profunda femoris demonstrates a greater than 90% stenosis in the main trunk approximately 4 cm distal to the origin.  The SFA and the previous bypass are occluded.  There is reconstitution of the proximal peroneal as well as the mid posterior tibial.  Both of these from the point of reconstitution are patent down to the foot.  Both are of good caliber and would be suitable targets for distal bypass.  Following angioplasty thrombectomy of the mid profunda femoris there is now a marked improvement in luminal gain.  Following angioplasty first to 4 mm then 5 mm there is now less than 10% residual stenosis.  As noted above the posterior tibial distally would be a good target for bypass if conduit was available.  The more proximal peroneal is also an  acceptable target.                           Disposition: Patient was taken to the recovery room in stable condition having tolerated the procedure well.  Joshua [name], Dolores Lory 04/08/2022,11:19 AM

## 2022-04-09 ENCOUNTER — Encounter: Payer: Self-pay | Admitting: Vascular Surgery

## 2022-04-11 ENCOUNTER — Inpatient Hospital Stay: Payer: Medicare Other

## 2022-04-11 VITALS — BP 147/79 | HR 78 | Temp 97.3°F | Resp 18

## 2022-04-11 DIAGNOSIS — Z5112 Encounter for antineoplastic immunotherapy: Secondary | ICD-10-CM | POA: Diagnosis not present

## 2022-04-11 DIAGNOSIS — C3491 Malignant neoplasm of unspecified part of right bronchus or lung: Secondary | ICD-10-CM

## 2022-04-11 MED ORDER — HEPARIN SOD (PORK) LOCK FLUSH 100 UNIT/ML IV SOLN
500.0000 [IU] | Freq: Once | INTRAVENOUS | Status: AC | PRN
  Administered 2022-04-11: 500 [IU]
  Filled 2022-04-11: qty 5

## 2022-04-11 MED ORDER — SODIUM CHLORIDE 0.9 % IV SOLN
10.0000 mg/kg | Freq: Once | INTRAVENOUS | Status: AC
  Administered 2022-04-11: 1000 mg via INTRAVENOUS
  Filled 2022-04-11: qty 20

## 2022-04-11 MED ORDER — SODIUM CHLORIDE 0.9 % IV SOLN
Freq: Once | INTRAVENOUS | Status: AC
  Filled 2022-04-11: qty 250

## 2022-04-11 NOTE — Patient Instructions (Signed)
[facility] [place]  Discharge Instructions: Thank you for choosing [facility] to provide your oncology and hematology care.  If you have a lab appointment with the [facility], please go directly to the [facility] and check in at the registration area.  Wear comfortable clothing and clothing appropriate for easy access to any Portacath or PICC line.   We strive to give you quality time with your provider. You may need to reschedule your appointment if you arrive late (15 or more minutes).  Arriving late affects you and other patients whose appointments are after yours.  Also, if you miss three or more appointments without notifying the office, you may be dismissed from the clinic at the provider's discretion.      For prescription refill requests, have your pharmacy contact our office and allow 72 hours for refills to be completed.    Today you received the following chemotherapy and/or immunotherapy agents IMFINZI      To help prevent nausea and vomiting after your treatment, we encourage you to take your nausea medication as directed.  BELOW ARE SYMPTOMS THAT SHOULD BE REPORTED IMMEDIATELY: *FEVER GREATER THAN 100.4 F (38 C) OR HIGHER *CHILLS OR SWEATING *NAUSEA AND VOMITING THAT IS NOT CONTROLLED WITH YOUR NAUSEA MEDICATION *UNUSUAL SHORTNESS OF BREATH *UNUSUAL BRUISING OR BLEEDING *URINARY PROBLEMS (pain or burning when urinating, or frequent urination) *BOWEL PROBLEMS (unusual diarrhea, constipation, pain near the anus) TENDERNESS IN MOUTH AND THROAT WITH OR WITHOUT PRESENCE OF ULCERS (sore throat, sores in mouth, or a toothache) UNUSUAL RASH, SWELLING OR PAIN  UNUSUAL VAGINAL DISCHARGE OR ITCHING   Items with * indicate a potential emergency and should be followed up as soon as possible or go to the Emergency Department if any problems should occur.  Please show the CHEMOTHERAPY ALERT CARD or IMMUNOTHERAPY ALERT CARD at check-in to the  Emergency Department and triage nurse.  Should you have questions after your visit or need to cancel or reschedule your appointment, please contact [facility] [place] [facility] [place]  [phone] and follow the prompts.  Office hours are 8:00 a.m. to 4:30 p.m. Monday - Friday. Please note that voicemails left after 4:00 p.m. may not be returned until the following business day.  We are closed weekends and major holidays. You have access to a nurse at all times for urgent questions. Please call the main number to the clinic [phone] and follow the prompts.  For any non-urgent questions, you may also contact your provider using MyChart. We now offer e-Visits for anyone 58 and older to request care online for non-urgent symptoms. For details visit [email or web address].   Also download the MyChart app! Go to the app store, search "MyChart", open the app, select [facility], and log in with your MyChart username and password.  Masks are optional in the cancer centers. If you would like for your care team to wear a mask while they are taking care of you, please let them know. For doctor visits, patients may have with them one support person who is at least 64 years old. At this time, visitors are not allowed in the infusion area.  Durvalumab Injection What is this medication? DURVALUMAB (dur VAL ue mab) treats some types of cancer. It works by helping your immune system slow or stop the spread of cancer cells. It is a monoclonal antibody. This medicine may be used for other purposes; ask your health care provider or pharmacist if you have questions. COMMON  BRAND NAME(S): IMFINZI What should I tell my care team before I take this medication? They need to know if you have any of these conditions: Allogeneic stem cell transplant (uses someone else's stem cells) Autoimmune diseases, such as Crohn disease, ulcerative colitis, lupus History of chest radiation Nervous system problems, such  as Guillain-Barre syndrome, myasthenia gravis Organ transplant An unusual or allergic reaction to durvalumab, other medications, foods, dyes, or preservatives Pregnant or trying to get pregnant Breast-feeding How should I use this medication? This medication is infused into a vein. It is given by your care team in a hospital or clinic setting. A special MedGuide will be given to you before each treatment. Be sure to read this information carefully each time. Talk to your care team about the use of this medication in children. Special care may be needed. Overdosage: If you think you have taken too much of this medicine contact a poison control center or emergency room at once. NOTE: This medicine is only for you. Do not share this medicine with others. What if I miss a dose? Keep appointments for follow-up doses. It is important not to miss your dose. Call your care team if you are unable to keep an appointment. What may interact with this medication? Interactions have not been studied. This list may not describe all possible interactions. Give your health care provider a list of all the medicines, herbs, non-prescription drugs, or dietary supplements you use. Also tell them if you smoke, drink alcohol, or use illegal drugs. Some items may interact with your medicine. What should I watch for while using this medication? Your condition will be monitored carefully while you are receiving this medication. You may need blood work while taking this medication. This medication may cause serious skin reactions. They can happen weeks to months after starting the medication. Contact your care team right away if you notice fevers or flu-like symptoms with a rash. The rash may be red or purple and then turn into blisters or peeling of the skin. You may also notice a red rash with swelling of the face, lips, or lymph nodes in your neck or under your arms. Tell your care team right away if you have any change  in your eyesight. Talk to your care team if you may be pregnant. Serious birth defects can occur if you take this medication during pregnancy and for 3 months after the last dose. You will need a negative pregnancy test before starting this medication. Contraception is recommended while taking this medication and for 3 months after the last dose. Your care team can help you find the option that works for you. Do not breastfeed while taking this medication and for 3 months after the last dose. What side effects may I notice from receiving this medication? Side effects that you should report to your care team as soon as possible: Allergic reactions--skin rash, itching, hives, swelling of the face, lips, tongue, or throat Dry cough, shortness of breath or trouble breathing Eye pain, redness, irritation, or discharge with blurry or decreased vision Heart muscle inflammation--unusual weakness or fatigue, shortness of breath, chest pain, fast or irregular heartbeat, dizziness, swelling of the ankles, feet, or hands Hormone gland problems--headache, sensitivity to light, unusual weakness or fatigue, dizziness, fast or irregular heartbeat, increased sensitivity to cold or heat, excessive sweating, constipation, hair loss, increased thirst or amount of urine, tremors or shaking, irritability Infusion reactions--chest pain, shortness of breath or trouble breathing, feeling faint or lightheaded Kidney injury (  glomerulonephritis)--decrease in the amount of urine, red or dark brown urine, foamy or bubbly urine, swelling of the ankles, hands, or feet Liver injury--right upper belly pain, loss of appetite, nausea, light-colored stool, dark yellow or brown urine, yellowing skin or eyes, unusual weakness or fatigue Pain, tingling, or numbness in the hands or feet, muscle weakness, change in vision, confusion or trouble speaking, loss of balance or coordination, trouble walking, seizures Rash, fever, and swollen lymph  nodes Redness, blistering, peeling, or loosening of the skin, including inside the mouth Sudden or severe stomach pain, bloody diarrhea, fever, nausea, vomiting Side effects that usually do not require medical attention (report these to your care team if they continue or are bothersome): Bone, joint, or muscle pain Diarrhea Fatigue Loss of appetite Nausea Skin rash This list may not describe all possible side effects. Call your doctor for medical advice about side effects. You may report side effects to FDA at 1-800-FDA-1088. Where should I keep my medication? This medication is given in a hospital or clinic. It will not be stored at home. NOTE: This sheet is a summary. It may not cover all possible information. If you have questions about this medicine, talk to your doctor, pharmacist, or health care provider.  2023 Elsevier/Gold Standard (2020-12-26 00:00:00)

## 2022-04-25 ENCOUNTER — Inpatient Hospital Stay: Payer: Medicare Other | Attending: Oncology

## 2022-04-25 ENCOUNTER — Encounter: Payer: Self-pay | Admitting: Oncology

## 2022-04-25 ENCOUNTER — Inpatient Hospital Stay: Payer: Medicare Other

## 2022-04-25 ENCOUNTER — Inpatient Hospital Stay (HOSPITAL_BASED_OUTPATIENT_CLINIC_OR_DEPARTMENT_OTHER): Payer: Medicare Other | Admitting: Oncology

## 2022-04-25 VITALS — BP 134/77 | HR 95 | Temp 97.5°F | Resp 18 | Wt 218.6 lb

## 2022-04-25 DIAGNOSIS — Z5112 Encounter for antineoplastic immunotherapy: Secondary | ICD-10-CM | POA: Insufficient documentation

## 2022-04-25 DIAGNOSIS — C3411 Malignant neoplasm of upper lobe, right bronchus or lung: Secondary | ICD-10-CM | POA: Insufficient documentation

## 2022-04-25 DIAGNOSIS — C7931 Secondary malignant neoplasm of brain: Secondary | ICD-10-CM | POA: Diagnosis present

## 2022-04-25 DIAGNOSIS — E032 Hypothyroidism due to medicaments and other exogenous substances: Secondary | ICD-10-CM | POA: Diagnosis not present

## 2022-04-25 DIAGNOSIS — C3491 Malignant neoplasm of unspecified part of right bronchus or lung: Secondary | ICD-10-CM | POA: Diagnosis not present

## 2022-04-25 DIAGNOSIS — Z79899 Other long term (current) drug therapy: Secondary | ICD-10-CM | POA: Diagnosis not present

## 2022-04-25 LAB — CBC WITH DIFFERENTIAL/PLATELET
Abs Immature Granulocytes: 0.02 10*3/uL (ref 0.00–0.07)
Basophils Absolute: 0.1 10*3/uL (ref 0.0–0.1)
Basophils Relative: 2 %
Eosinophils Absolute: 0.3 10*3/uL (ref 0.0–0.5)
Eosinophils Relative: 6 %
HCT: 37.3 % — ABNORMAL LOW (ref 39.0–52.0)
Hemoglobin: 12 g/dL — ABNORMAL LOW (ref 13.0–17.0)
Immature Granulocytes: 0 %
Lymphocytes Relative: 15 %
Lymphs Abs: 0.8 10*3/uL (ref 0.7–4.0)
MCH: 27.8 pg (ref 26.0–34.0)
MCHC: 32.2 g/dL (ref 30.0–36.0)
MCV: 86.3 fL (ref 80.0–100.0)
Monocytes Absolute: 0.6 10*3/uL (ref 0.1–1.0)
Monocytes Relative: 12 %
Neutro Abs: 3.3 10*3/uL (ref 1.7–7.7)
Neutrophils Relative %: 65 %
Platelets: 212 10*3/uL (ref 150–400)
RBC: 4.32 MIL/uL (ref 4.22–5.81)
RDW: 14.9 % (ref 11.5–15.5)
WBC: 5.1 10*3/uL (ref 4.0–10.5)
nRBC: 0 % (ref 0.0–0.2)

## 2022-04-25 LAB — COMPREHENSIVE METABOLIC PANEL
ALT: 24 U/L (ref 0–44)
AST: 29 U/L (ref 15–41)
Albumin: 3.7 g/dL (ref 3.5–5.0)
Alkaline Phosphatase: 74 U/L (ref 38–126)
Anion gap: 6 (ref 5–15)
BUN: 16 mg/dL (ref 8–23)
CO2: 28 mmol/L (ref 22–32)
Calcium: 9.2 mg/dL (ref 8.9–10.3)
Chloride: 109 mmol/L (ref 98–111)
Creatinine, Ser: 1.22 mg/dL (ref 0.61–1.24)
GFR, Estimated: >60 mL/min (ref >60)
Glucose, Bld: 128 mg/dL — ABNORMAL HIGH (ref 70–99)
Potassium: 3.4 mmol/L — ABNORMAL LOW (ref 3.5–5.1)
Sodium: 143 mmol/L (ref 135–145)
Total Bilirubin: 0.8 mg/dL (ref 0.3–1.2)
Total Protein: 6.7 g/dL (ref 6.5–8.1)

## 2022-04-25 LAB — TSH: TSH: 42.267 u[IU]/mL — ABNORMAL HIGH (ref 0.350–4.500)

## 2022-04-25 MED ORDER — SODIUM CHLORIDE 0.9 % IV SOLN
Freq: Once | INTRAVENOUS | Status: AC
  Filled 2022-04-25: qty 250

## 2022-04-25 MED ORDER — LEVOTHYROXINE SODIUM 100 MCG PO TABS: 100.0000 ug | ORAL_TABLET | Freq: Every day | ORAL | 0 refills | Status: DC

## 2022-04-25 MED ORDER — SODIUM CHLORIDE 0.9 % IV SOLN
10.0000 mg/kg | Freq: Once | INTRAVENOUS | Status: AC
  Administered 2022-04-25: 1000 mg via INTRAVENOUS
  Filled 2022-04-25: qty 20

## 2022-04-25 MED ORDER — HEPARIN SOD (PORK) LOCK FLUSH 100 UNIT/ML IV SOLN
500.0000 [IU] | Freq: Once | INTRAVENOUS | Status: AC | PRN
  Administered 2022-04-25: 500 [IU]
  Filled 2022-04-25: qty 5

## 2022-04-25 NOTE — Progress Notes (Signed)
Patient here for oncology follow-up appointment, expresses no new concerns at this time.

## 2022-04-25 NOTE — Assessment & Plan Note (Signed)
Recommend patient to take Calcium 1200mg  daily.

## 2022-04-25 NOTE — Progress Notes (Signed)
Hematology/Oncology Progress note [phone] [phone]      Patient Care Team: [name], MD as PCP - General (Physician Assistant) [name], MD as Referring Physician (Physician Assistant) Bary Castilla, [name], MD (General Surgery) Telford Nab, RN as Oncology Nurse Navigator [name], MD as Consulting Physician (Oncology)   ASSESSMENT & PLAN:   Cancer Staging  Squamous cell carcinoma of right lung [facility]) Staging form: Lung, AJCC 8th Edition - Clinical stage from 09/20/2020: Stage IV (cT1b, cN2, cM1) - Signed by [name], MD on 12/13/2021   Squamous cell carcinoma of right lung ([place]) Durvalumab was previously held due to pneumonitis.  Repeat CT scan showed improvement of patchy ground-glass opacities.  Reviewed and discussed with patient Labs reviewed and discussed with patient. Proceed with Durvalumab  Malignant neoplasm metastatic to brain [facility]) Status post radiation.  02/19/22 MRI showed decreased brain lesion size and edema.  Continue surveillance every 3 months- MRI brain    Hypothyroidism due to medication Increase to Synthroid 100 mcg daily   Hypocalcemia Recommend patient to take Calcium $RemoveBefo'1200mg'[id number]$  daily.     Orders Placed This Encounter  Procedures   MR BRAIN W WO CONTRAST    Standing Status:   Future    Standing Expiration Date:   04/26/2023    Order Specific Question:   If indicated for the ordered procedure, I authorize the administration of contrast media per Radiology protocol    Answer:   Yes    Order Specific Question:   What is the patient's sedation requirement?    Answer:   No Sedation    Order Specific Question:   Does the patient have a pacemaker or implanted devices?    Answer:   No    Order Specific Question:   Use SRS Protocol?    Answer:   No    Order Specific Question:   Radiology Contrast Protocol - do NOT remove file path    Answer:   [email or web address]     Order Specific Question:   Preferred imaging location?    Answer:   [facility] (table limit - 550lbs)   CBC with Differential    Standing Status:   Future    Standing Expiration Date:   05/10/2023   Comprehensive metabolic panel    Standing Status:   Future    Standing Expiration Date:   05/10/2023   TSH    Standing Status:   Future    Standing Expiration Date:   05/10/2023   T4, free    Standing Status:   Future    Standing Expiration Date:   05/24/2023   CBC with Differential    Standing Status:   Future    Standing Expiration Date:   05/24/2023   Comprehensive metabolic panel    Standing Status:   Future    Standing Expiration Date:   05/24/2023   CBC with Differential    Standing Status:   Future    Standing Expiration Date:   06/07/2023   Comprehensive metabolic panel    Standing Status:   Future    Standing Expiration Date:   06/07/2023   TSH    Standing Status:   Future    Standing Expiration Date:   06/07/2023    Follow up lab MD 2 weeks Durvalumab   All questions were answered. The patient knows to call the clinic with any problems, questions or concerns.  [name], MD, PhD [facility] Hematology Oncology 04/25/2022  CHIEF COMPLAINTS/REASON FOR VISIT:  Follow up for lung cancer  HISTORY OF PRESENTING ILLNESS:  Joshua [name] is a 66 y.o. male who has above history reviewed by me today presents for follow up visit for Stage IV lung squamous cell carcinoma. Oncology History  Squamous cell carcinoma of right lung ([place])  09/18/2020 Imaging   PET scan showed right upper lobe nodule maximal SUV 11.5.  Hypermetabolic cluster right paratracheal adenopathy with maximum SUV up to 12.  No findings of metastatic disease to the neck/abdomen/pelvis or skeleton.   09/20/2020 Initial Diagnosis   Squamous cell carcinoma of right lung (HCC) NGS showed PD-L1 TPS 95%, LRP1B S959fs, PIK3CA E545K, RB1 c1422-2A>T, RIT1 T83_ A84del, STK11 P240fs, TPS53 R248W, TMB  19.31mut/mb, MS stable.    09/20/2020 Cancer Staging   Staging form: Lung, AJCC 8th Edition - Clinical stage from 09/20/2020: Stage IV (cT1b, cN2, cM1) - Signed by [name], MD on 12/13/2021   10/03/2020 Imaging   MRI brain with and without contrast showed no evidence of intracranial metastatic disease. Well-defined round lesion within the left parotid tail measuring approximately 2.3 x 1.8 x 1.8 cm   10/09/2020 - 11/27/2020 Radiation Therapy   concurrent chemoradiation   10/10/2020 - 10/31/2020 Chemotherapy   10/10/2020 concurrent chemoradiation.  carboplatin AUC of 2 and Taxol 45 mg/m2 x 4.  Additional chemotherapy was held due to thrombocytopenia, neutropenia       12/31/2020 - 03/21/2022 Chemotherapy   Maintenance  Durvalumab q14d      12/31/2020 -  Chemotherapy   Patient is on Treatment Plan : LUNG Durvalumab (10) q14d     03/28/2021 Imaging   CT without contrast showed new linear right perihilar consolidation with associated traction bronchiectasis.  Likely postradiation changes.  Additional new peribronchial vascular/nodular groundglass opacities with areas of consolidation or seen primarily in the right upper and lower lobes, more distant from expected radiation fields. 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  Per my discussion with pulmonology Dr. 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     05/09/2021 Imaging   Repeat CT chest without contrast showed evolving radiation changes in the right perihilar region likely accounting for interval increase to thickening of the posterior wall of the right upper lobe bronchus.  The more peripheral right lung groundglass opacities seen on most recent study have generally improved with exception of one peripherally right upper lobe which appears new.  These are likely inflammatory.  No findings highly suspicious for local  recurrence.   05/30/2021 - 01/10/2022 Chemotherapy   Durvalumab q14d     10/07/2021 Imaging   MRI brain with and without contrast showed 2 contrast-enhancing lesions, most consistent with metastatic disease.  Largest lesion is in the left frontal lobe and measures up to 1.3 cm with a large amount of surrounding edema and 9 mm rightward midline shift.  The other enhancing lesion was 4 mm in the right occipital lobe with a small amount of surrounding edema.  No acute or chronic hemorrhage.   10/07/2021 Progression   Headache after colonoscopy, progressive difficulty using her right arm.  Progressively worsening of speech. Stage IV lung cancer with brain metastasis.  started on Dexamethasone.  He was seen by neurosurgery and radiation oncology.  Recommendation is to proceed with SRS.  Patient was discharged with dexamethasone   10/16/2021 Imaging   PET scan showed postradiation changes in the right supra  hilar lung without evidence of local recurrence.  No evidence of lung cancer recurrence or metastatic disease.  Single focus of intense metabolic activity associated with posterior left 11th rib likely secondary to recent fall/posttraumatic metabolic changes.  Frontal  uptake in the left frontal lobe corresponding to the known metastatic lesion in the brain   10/29/2021 -  Radiation Therapy   Brain Radiation [facility]   12/10/2021 Imaging   Brain MRI: Continued interval decrease in size of a left frontal lobe metastasis, now measuring 10 mm. Moderate surrounding edema has also decreased.  3-4 mm metastasis within the right occipital lobe, not significantly changed in size. There is now a small focus of central non enhancement within this lesion. Increased edema. 3 mm enhancing metastasis within the high left parietal lobe. In retrospect, this was likely present as a subtle punctate focus of enhancement on the prior brain MRI of 10/22/2021.   01/18/2022 Imaging   CT chest abdomen pelvis w contrast 1. Stable  post treatment changes in the right upper lobe medially with dense radiation fibrosis but no findings to suggest local recurrent tumor. 2. Significant progression of ill-defined ground-glass opacity, interstitial thickening and airspace nodularity in the right upper lobe and upper aspect of the right lower lobe. Findings could reflect an inflammatory or atypical infectious process, drug induced pneumonitis or interstitial spread of tumor.3. No mediastinal or hilar mass or adenopathy. 4. No findings for abdominal/pelvic metastatic disease or osseous metastatic disease.   02/19/2022 Imaging   MRI brain w wo contrast showed Three metastatic deposits in the brain appear smaller. Decreased edema in the left parietal lobe. No new lesions    04/04/2022 Imaging   CT chest w contrast 1. Stable radiation changes in the right perihilar region without evidence of local recurrence. 2. Interval improvement in the patchy ground-glass opacities previously demonstrated in both lungs, most consistent with a resolving infectious or inflammatory process (including immunotherapy-related pneumonitis). 3. No evidence of metastatic disease.    Today he feels well. No new complaints. Chronic SOB, stable, not worse.  Intermittent headache, is better. denies any focal deficits. Stable weight. No focal deficit  Review of Systems  Constitutional:  Positive for fatigue. Negative for appetite change, chills, fever and unexpected weight change.  HENT:   Negative for hearing loss and voice change.   Eyes:  Negative for eye problems and icterus.  Respiratory:  Negative for chest tightness, cough and shortness of breath.   Cardiovascular:  Negative for chest pain and leg swelling.  Gastrointestinal:  Negative for abdominal distention and abdominal pain.  Endocrine: Negative for hot flashes.  Genitourinary:  Negative for difficulty urinating, dysuria and frequency.   Musculoskeletal:  Negative for arthralgias.  Skin:   Negative for itching and rash.  Neurological:  Positive for headaches. Negative for light-headedness and numbness.  Hematological:  Negative for adenopathy. Does not bruise/bleed easily.  Psychiatric/Behavioral:  Negative for confusion.       MEDICAL HISTORY:  Past Medical History:  Diagnosis Date   Allergy    Cancer ([place])    lung   COPD (chronic obstructive pulmonary disease) (HCC)    Dyspnea    former smoker. only doe   History of kidney stones    Hypertension    Peripheral vascular disease ([place])    Pre-diabetes    Sleep apnea     SURGICAL HISTORY: Past Surgical History:  Procedure Laterality Date   ANTERIOR CERVICAL DECOMP/DISCECTOMY FUSION N/A 03/14/2020   Procedure: ANTERIOR CERVICAL DECOMPRESSION/DISCECTOMY FUSION 3  LEVELS C4-7;  Surgeon: Meade Maw, MD;  Location: [facility] ORS;  Service: Neurosurgery;  Laterality: N/A;   BACK SURGERY  2011   CENTRAL LINE INSERTION Right 09/10/2016   Procedure: CENTRAL LINE INSERTION;  Surgeon: Katha Cabal, MD;  Location: [facility] ORS;  Service: Vascular;  Laterality: Right;   CERVICAL FUSION     C4-c7 on Mar 14, 2020   COLONOSCOPY  2013 ?   COLONOSCOPY N/A 09/09/2021   Procedure: COLONOSCOPY;  Surgeon: [name], DO;  Location: [facility] ENDOSCOPY;  Service: Gastroenterology;  Laterality: N/A;   CORONARY ANGIOPLASTY     ESOPHAGOGASTRODUODENOSCOPY N/A 09/09/2021   Procedure: ESOPHAGOGASTRODUODENOSCOPY (EGD);  Surgeon: [name], DO;  Location: [facility] ENDOSCOPY;  Service: Gastroenterology;  Laterality: N/A;   FEMORAL-POPLITEAL BYPASS GRAFT Right 09/10/2016   Procedure: BYPASS GRAFT FEMORAL-POPLITEAL ARTERY ( BELOW KNEE );  Surgeon: Katha Cabal, MD;  Location: [facility] ORS;  Service: Vascular;  Laterality: Right;   FEMORAL-TIBIAL BYPASS GRAFT Right 03/23/2020   Procedure: BYPASS GRAFT RIGHT FEMORAL- DISTAL TIBIAL ARTERY WITH DISTAL FLOW GRAFT;  Surgeon: Katha Cabal, MD;  Location: [facility] ORS;  Service: Vascular;   Laterality: Right;   LEFT HEART CATH AND CORONARY ANGIOGRAPHY Left 05/16/2021   Procedure: LEFT HEART CATH AND CORONARY ANGIOGRAPHY;  Surgeon: Andrez Grime, MD;  Location: [place] CV LAB;  Service: Cardiovascular;  Laterality: Left;   LOWER EXTREMITY ANGIOGRAPHY Right 11/18/2016   Procedure: Lower Extremity Angiography;  Surgeon: Katha Cabal, MD;  Location: [place] CV LAB;  Service: Cardiovascular;  Laterality: Right;   LOWER EXTREMITY ANGIOGRAPHY Right 12/09/2016   Procedure: Lower Extremity Angiography;  Surgeon: Katha Cabal, MD;  Location[place] [facility] LAB;  Service: Cardiovascular;  Laterality: Right;   LOWER EXTREMITY ANGIOGRAPHY Right 11/15/2019   Procedure: LOWER EXTREMITY ANGIOGRAPHY;  Surgeon: Katha Cabal, MD;  Location: [place] CV LAB;  Service: Cardiovascular;  Laterality: Right;   LOWER EXTREMITY ANGIOGRAPHY Right 12/01/2019   Procedure: Lower Extremity Angiography;  Surgeon: Algernon Huxley, MD;  Location: [place] CV LAB;  Service: Cardiovascular;  Laterality: Right;   LOWER EXTREMITY ANGIOGRAPHY Right 12/02/2019   Procedure: LOWER EXTREMITY ANGIOGRAPHY;  Surgeon: Katha Cabal, MD;  Location: [place] CV LAB;  Service: Cardiovascular;  Laterality: Right;   LOWER EXTREMITY ANGIOGRAPHY Right 03/23/2020   Procedure: Lower Extremity Angiography;  Surgeon: Katha Cabal, MD;  Location: [place] CV LAB;  Service: Cardiovascular;  Laterality: Right;   LOWER EXTREMITY ANGIOGRAPHY Right 09/25/2021   Procedure: Lower Extremity Angiography;  Surgeon: Katha Cabal, MD;  Location: [place] CV LAB;  Service: Cardiovascular;  Laterality: Right;   LOWER EXTREMITY ANGIOGRAPHY Right 09/26/2021   Procedure: Lower Extremity Angiography;  Surgeon: Algernon Huxley, MD;  Location: [place] CV LAB;  Service: Cardiovascular;  Laterality: Right;   LOWER EXTREMITY ANGIOGRAPHY Right 04/08/2022   Procedure: Lower Extremity Angiography;   Surgeon: Katha Cabal, MD;  Location: [place] CV LAB;  Service: Cardiovascular;  Laterality: Right;   LOWER EXTREMITY INTERVENTION  11/18/2016   Procedure: Lower Extremity Intervention;  Surgeon: Katha Cabal, MD;  Location: [place] CV LAB;  Service: Cardiovascular;;   PERIPHERAL VASCULAR CATHETERIZATION Right 01/02/2015   Procedure: Lower Extremity Angiography;  Surgeon: Katha Cabal, MD;  Location: [place] CV LAB;  Service: Cardiovascular;  Laterality: Right;   PERIPHERAL VASCULAR CATHETERIZATION Right 06/18/2015   Procedure: Lower Extremity Angiography;  Surgeon: Algernon Huxley, MD;  Location: [facility] INVASIVE CV  LAB;  Service: Cardiovascular;  Laterality: Right;   PERIPHERAL VASCULAR CATHETERIZATION  06/18/2015   Procedure: Lower Extremity Intervention;  Surgeon: Algernon Huxley, MD;  Location: [place] CV LAB;  Service: Cardiovascular;;   PERIPHERAL VASCULAR CATHETERIZATION N/A 10/09/2015   Procedure: Abdominal Aortogram w/Lower Extremity;  Surgeon: Katha Cabal, MD;  Location: [place] CV LAB;  Service: Cardiovascular;  Laterality: N/A;   PERIPHERAL VASCULAR CATHETERIZATION  10/09/2015   Procedure: Lower Extremity Intervention;  Surgeon: Katha Cabal, MD;  Location: [place] CV LAB;  Service: Cardiovascular;;   PERIPHERAL VASCULAR CATHETERIZATION Right 10/10/2015   Procedure: Lower Extremity Angiography;  Surgeon: Algernon Huxley, MD;  Location: [place] CV LAB;  Service: Cardiovascular;  Laterality: Right;   PERIPHERAL VASCULAR CATHETERIZATION Right 10/10/2015   Procedure: Lower Extremity Intervention;  Surgeon: Algernon Huxley, MD;  Location: [place] CV LAB;  Service: Cardiovascular;  Laterality: Right;   PERIPHERAL VASCULAR CATHETERIZATION Right 12/03/2015   Procedure: Lower Extremity Angiography;  Surgeon: Algernon Huxley, MD;  Location: [place] CV LAB;  Service: Cardiovascular;  Laterality: Right;   PERIPHERAL VASCULAR CATHETERIZATION Right  12/04/2015   Procedure: Lower Extremity Angiography;  Surgeon: Algernon Huxley, MD;  Location: [place] CV LAB;  Service: Cardiovascular;  Laterality: Right;   PERIPHERAL VASCULAR CATHETERIZATION  12/04/2015   Procedure: Lower Extremity Intervention;  Surgeon: Algernon Huxley, MD;  Location: [place] CV LAB;  Service: Cardiovascular;;   PERIPHERAL VASCULAR CATHETERIZATION Right 06/30/2016   Procedure: Lower Extremity Angiography;  Surgeon: Algernon Huxley, MD;  Location: [place] CV LAB;  Service: Cardiovascular;  Laterality: Right;   PERIPHERAL VASCULAR CATHETERIZATION Right 06/25/2016   Procedure: Lower Extremity Angiography;  Surgeon: Katha Cabal, MD;  Location: [place] CV LAB;  Service: Cardiovascular;  Laterality: Right;   PERIPHERAL VASCULAR CATHETERIZATION Right 07/01/2016   Procedure: Lower Extremity Angiography;  Surgeon: Katha Cabal, MD;  Location: [place] CV LAB;  Service: Cardiovascular;  Laterality: Right;   PERIPHERAL VASCULAR CATHETERIZATION  07/01/2016   Procedure: Lower Extremity Intervention;  Surgeon: Katha Cabal, MD;  Location: [place] CV LAB;  Service: Cardiovascular;;   PERIPHERAL VASCULAR CATHETERIZATION Right 08/01/2016   Procedure: Lower Extremity Angiography;  Surgeon: Katha Cabal, MD;  Location: [place] CV LAB;  Service: Cardiovascular;  Laterality: Right;   PORTA CATH INSERTION N/A 10/05/2020   Procedure: PORTA CATH INSERTION;  Surgeon: Katha Cabal, MD;  Location: [place] CV LAB;  Service: Cardiovascular;  Laterality: N/A;   stent placement in right leg Right    VIDEO BRONCHOSCOPY N/A 09/14/2020   Procedure: VIDEO BRONCHOSCOPY WITH FLUORO;  Surgeon: Tyler Pita, MD;  Location: [facility] ORS;  Service: Cardiopulmonary;  Laterality: N/A;   VIDEO BRONCHOSCOPY WITH ENDOBRONCHIAL ULTRASOUND N/A 09/14/2020   Procedure: VIDEO BRONCHOSCOPY WITH ENDOBRONCHIAL ULTRASOUND;  Surgeon: Tyler Pita, MD;  Location: [facility]  ORS;  Service: Cardiopulmonary;  Laterality: N/A;    SOCIAL HISTORY: Social History   Socioeconomic History   Marital status: Single    Spouse name: Not on file   Number of children: Not on file   Years of education: Not on file   Highest education level: Not on file  Occupational History   Occupation: unemployed  Tobacco Use   Smoking status: Former    Packs/day: 2.00    Years: 38.00    Total pack years: 76.00    Types: Cigarettes    Quit date: 12/20/2014    Years since quitting:  7.3   Smokeless tobacco: Never   Tobacco comments:    quit smoking in 2017 most smoked 2   Vaping Use   Vaping Use: Former  Substance and Sexual Activity   Alcohol use: Not Currently   Drug use: No   Sexual activity: Yes  Other Topics Concern   Not on file  Social History Narrative   Not on file   Social Determinants of Health   Financial Resource Strain: Not on file  Food Insecurity: Not on file  Transportation Needs: Not on file  Physical Activity: Not on file  Stress: Not on file  Social Connections: Not on file  Intimate Partner Violence: Not on file    FAMILY HISTORY: Family History  Problem Relation Age of Onset   Diabetes Mother    Hypertension Mother    Heart murmur Mother    Leukemia Mother    Throat cancer Maternal Grandmother    Colon cancer Neg Hx    Breast cancer Neg Hx     ALLERGIES:  has No Known Allergies.  MEDICATIONS:  Current Outpatient Medications  Medication Sig Dispense Refill   acetaminophen (TYLENOL) 325 MG tablet Take 650 mg by mouth every 6 (six) hours as needed for moderate pain.     aspirin EC 81 MG tablet Take 81 mg by mouth daily. Swallow whole.     clopidogrel (PLAVIX) 75 MG tablet TAKE 1 TABLET BY MOUTH  DAILY 100 tablet 2   Glycopyrrolate-Formoterol (BEVESPI AEROSPHERE) 9-4.8 MCG/ACT AERO Inhale 2 puffs into the lungs 2 (two) times daily. 32.1 g 1   levETIRAcetam (KEPPRA) 500 MG tablet Take 1 tablet (500 mg total) by mouth 2 (two) times  daily. 180 tablet 3   levothyroxine (SYNTHROID) 100 MCG tablet Take 1 tablet (100 mcg total) by mouth daily before breakfast. 30 tablet 0   simvastatin (ZOCOR) 40 MG tablet Take 40 mg by mouth at bedtime.     omeprazole (PRILOSEC) 40 MG capsule Take 1 capsule (40 mg total) by mouth daily. (Patient not taking: Reported on 04/08/2022) 90 capsule 1   No current facility-administered medications for this visit.     PHYSICAL EXAMINATION: ECOG PERFORMANCE STATUS: 1 - Symptomatic but completely ambulatory Vitals:   04/25/22 0913  BP: 134/77  Pulse: 95  Resp: 18  Temp: (!) 97.5 F (36.4 C)  SpO2: 99%   Filed Weights   04/25/22 0913  Weight: 218 lb 9.6 oz (99.2 kg)    Physical Exam Constitutional:      General: He is not in acute distress.    Appearance: He is not diaphoretic.     Comments: Patient walks with a cane  HENT:     Head: Normocephalic and atraumatic.     Nose: Nose normal.     Mouth/Throat:     Pharynx: No oropharyngeal exudate.  Eyes:     General: No scleral icterus.    Pupils: Pupils are equal, round, and reactive to light.  Cardiovascular:     Rate and Rhythm: Normal rate and regular rhythm.     Heart sounds: No murmur heard. Pulmonary:     Effort: Pulmonary effort is normal. No respiratory distress.     Breath sounds: No rales.  Chest:     Chest wall: No tenderness.  Abdominal:     General: There is no distension.     Palpations: Abdomen is soft.     Tenderness: There is no abdominal tenderness.  Musculoskeletal:  General: Normal range of motion.     Cervical back: Normal range of motion and neck supple.  Skin:    General: Skin is warm and dry.     Findings: No erythema.  Neurological:     Mental Status: He is alert and oriented to person, place, and time.     Cranial Nerves: No cranial nerve deficit.     Motor: No abnormal muscle tone.     Coordination: Coordination normal.  Psychiatric:        Mood and Affect: Affect normal.       LABORATORY DATA:  I have reviewed the data as listed    Latest Ref Rng & Units 04/25/2022    9:01 AM 04/07/2022    1:45 PM 03/05/2022    2:12 PM  CBC  WBC 4.0 - 10.5 K/uL 5.1  4.4  5.3   Hemoglobin 13.0 - 17.0 g/dL 12.0  13.2  12.5   Hematocrit 39.0 - 52.0 % 37.3  41.6  40.1   Platelets 150 - 400 K/uL 212  212  140       Latest Ref Rng & Units 04/25/2022    9:01 AM 04/08/2022    9:25 AM 04/07/2022    1:45 PM  CMP  Glucose 70 - 99 mg/dL 128   101   BUN 8 - 23 mg/dL $Remove'16  19  17   'dFnNylz$ Creatinine 0.61 - 1.24 mg/dL 1.22  0.90  1.03   Sodium 135 - 145 mmol/L 143   145   Potassium 3.5 - 5.1 mmol/L 3.4   4.0   Chloride 98 - 111 mmol/L 109   110   CO2 22 - 32 mmol/L 28   27   Calcium 8.9 - 10.3 mg/dL 9.2   9.1   Total Protein 6.5 - 8.1 g/dL 6.7   7.1   Total Bilirubin 0.3 - 1.2 mg/dL 0.8   0.5   Alkaline Phos 38 - 126 U/L 74   70   AST 15 - 41 U/L 29   28   ALT 0 - 44 U/L 24   27      RADIOGRAPHIC STUDIES: I have personally reviewed the radiological images as listed and agreed with the findings in the report. PERIPHERAL VASCULAR CATHETERIZATION  Result Date: 04/08/2022 See surgical note for result.  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 Chemotherapy, radiation and immunotherapy completed. * Tracking Code: BO * EXAM: CT CHEST WITH CONTRAST TECHNIQUE: Multidetector CT imaging of the chest was performed during intravenous contrast administration. RADIATION DOSE REDUCTION: This exam was performed according to the departmental dose-optimization program which includes automated exposure control, adjustment of the mA and/or kV according to patient size and/or use of iterative reconstruction technique. CONTRAST:  84mL OMNIPAQUE IOHEXOL 300 MG/ML  SOLN COMPARISON:  Multiple priors, including CT 01/17/2022 and PET-CT 10/16/2021. FINDINGS: Cardiovascular: Right IJ Port-A-Cath  extends to the superior cavoatrial junction. No acute vascular findings. Minimal aortic and coronary artery atherosclerosis. The heart size is normal. There is no pericardial effusion. Mediastinum/Nodes: There are no enlarged mediastinal, hilar or axillary lymph nodes.Stable right perihilar soft tissue thickening. No definite thyroid tissue identified. The esophagus and trachea appear unremarkable. Lungs/Pleura: Trace right pleural effusion. No pneumothorax. There are stable radiation changes in the right perihilar region with architectural distortion and traction bronchiectasis. No recurrent mass lesion identified. The patchy ground-glass opacity seen on  the most recent examination have improved. No suspicious pulmonary nodules. Upper abdomen: Mild hepatic steatosis. The visualized upper abdomen otherwise appears unremarkable. Musculoskeletal/Chest wall: There is no chest wall mass or suspicious osseous finding. Old left-sided rib fractures. IMPRESSION: 1. Stable radiation changes in the right perihilar region without evidence of local recurrence. 2. Interval improvement in the patchy ground-glass opacities previously demonstrated in both lungs, most consistent with a resolving infectious or inflammatory process (including immunotherapy-related pneumonitis). 3. No evidence of metastatic disease. Electronically Signed   By: Richardean Sale M.D.   On: 04/06/2022 11:53   VAS [place] LOWER EXTREMITY ARTERIAL DUPLEX  Result Date: 03/10/2022 LOWER EXTREMITY ARTERIAL DUPLEX STUDY Patient Name:  VINNIE GOMBERT  Date of Exam:   03/10/2022 Medical Rec #: [id number]            Accession #:    [id number] Date of Birth: June 22, 1956           Patient Gender: M Patient Age:   63 years Exam Location:  [facility] Procedure:      VAS [place] LOWER EXTREMITY ARTERIAL DUPLEX Referring Phys: [name] --------------------------------------------------------------------------------  Indications: Claudication, peripheral  artery disease, and pain post workout. High Risk Factors: Past history of smoking. Other Factors: ASO s/p multiple interventions and bypass.                11/15/2019 Right thrombectomy of Fempop insitu graft with added                stent throughout.                 12/01/2019: Aortogram and Selective Right Lower Extremity                Angiogram. 6 mg of TPA delivered in a lysis catheter from the                Origin of the Right Femoral -Popliteal bypass down the proximal                ATA with placement of the lysis.                 12/02/2019: PTA of the Right Posterior Tibial Artery. PTA of the                Right Peroneal Artery. Mechanical Thrombectomy of the Right                Femoral -Popliteal Bypass graft. Mechanical Thrombectomy of the                Right Tibioperoneal Trunk and Posterior Tibial Artery.                 09/25/2021: Right Lower Extremity Angiography third order                catheter placement. PTA of the proximal peroneal and                tibioperoneal trunk to 2.5 mm with an ultra score balloon. PTA of                the Femoral Artery and the proximal anastomosis of the bypass                graft to 4 mm with an ultra score balloon. Administration of 5 mg                of tpa throughout the entire  bypass and in the peroneal Artery.                Initiation of continuous tpa infusion Right lower Extremity for                limb salvage. Insertion of a triple lumen catheter Left CFV with                [place] and fluoroscopic guidance.                 09/26/2021: Right LE Angiogram. PTA of the Right Posterior Tibial                Artery with 2.5 mm diameter by 30 cm length angioplasty balloon.                PTA of the distal bypass anastomosis and distal bypass graft                analogous to the Popliteal artery with 4 mm diameter by 8 cm                length Lutonix drug coated angioplasty balloon.  Vascular Interventions: Hx of multiple right lower extremity  PTA's/stents.                         09/10/2016 Right femoral to below the knee popliteal                         artery bypass graft placement. 5/01 Right bypass graft &                         mid popliteal artery PTA & coil embolectomy of large                         branch of bypass graft. 12/09/16 Right distal bypass                         graft PTA/stent and right posterior tibial artery PTA.                         03/23/2020 Rt PTA thrombectomy. Rt Profunda fem artery to                         PTA bypass. Current ABI:            Right: 0.28, Left: 1.21 Performing Technologist: Delorise Shiner RVT  Examination Guidelines: A complete evaluation includes B-mode imaging, spectral Doppler, color Doppler, and power Doppler as needed of all accessible portions of each vessel. Bilateral testing is considered an integral part of a complete examination. Limited examinations for reoccurring indications may be performed as noted.  +----------+--------+-----+--------+----------+--------+ RIGHT     PSV cm/sRatioStenosisWaveform  Comments +----------+--------+-----+--------+----------+--------+ CFA Distal76                   triphasic          +----------+--------+-----+--------+----------+--------+ DFA       47                   biphasic           +----------+--------+-----+--------+----------+--------+ SFA Prox  0                                       +----------+--------+-----+--------+----------+--------+  SFA Mid   0                                       +----------+--------+-----+--------+----------+--------+ SFA Distal0                                       +----------+--------+-----+--------+----------+--------+ POP Prox  35                   monophasic         +----------+--------+-----+--------+----------+--------+ PTA Distal12                   monophasic         +----------+--------+-----+--------+----------+--------+  Right Graft #1: Fem- pop  +------------------+--------+--------+----------+--------+                   PSV cm/sStenosisWaveform  Comments +------------------+--------+--------+----------+--------+ Inflow            76              monophasic         +------------------+--------+--------+----------+--------+ Prox Anastomosis  0                                  +------------------+--------+--------+----------+--------+ Proximal Graft    0                                  +------------------+--------+--------+----------+--------+ Mid Graft         0                                  +------------------+--------+--------+----------+--------+ Distal Graft      0                                  +------------------+--------+--------+----------+--------+ Distal Anastomosis0                                  +------------------+--------+--------+----------+--------+ Outflow           35              monophasic         +------------------+--------+--------+----------+--------+   Summary: Right: Total occlusion noted in the superficial femoral artery. Occluded right fem-pop bypass graft.  See table(s) above for measurements and observations. Electronically signed by [name] MD on 03/10/2022 at 29:05:38 PM.    Final    VAS [place] ABI WITH/WO TBI  Result Date: 03/10/2022  LOWER EXTREMITY DOPPLER STUDY Patient Name:  LODEN LAURENT  Date of Exam:   03/10/2022 Medical Rec #: [id number]            Accession #:    [id number] Date of Birth: 10-13-1955           Patient Gender: M Patient Age:   42 years Exam Location:  [facility] Procedure:      VAS [place] ABI WITH/WO TBI Referring Phys: [facility] --------------------------------------------------------------------------------  Indications: Claudication, peripheral artery disease, and Routine f/u. High Risk Factors: Hyperlipidemia,  past history of smoking. Other Factors: ASO s/p multiple interventions and bypass.                11/15/2019  Right thrombectomy of Fempop insitu graft with added                stent throughout.                 12/01/2019: Aortogram and Selective Right Lower Extremity                Angiogram. 6 mg of TPA delivered in a lysis catheter from the                Origin of the Right Femoral -Popliteal bypass down the proximal                ATA with placement of the lysis.                 12/02/2019: PTA of the Right Posterior Tibial Artery. PTA of the                Right Peroneal Artery. Mechanical Thrombectomy of the Right                Femoral -Popliteal Bypass graft. Mechanical Thrombectomy of the                Right Tibioperoneal Trunk and Posterior Tibial Artery.                 09/25/2021: Right Lower Extremity Angiography third order                catheter placement. PTA of the proximal peroneal and                tibioperoneal trunk to 2.5 mm with an ultra score balloon. PTA of                the Femoral Artery and the proximal anastomosis of the bypass                graft to 4 mm with an ultra score balloon. Administration of 5 mg                of tpa throughout the entire bypass and in the peroneal Artery.                Initiation of continuous tpa infusion Right lower Extremity for                limb salvage. Insertion of a triple lumen catheter Left CFV with                [place] and fluoroscopic guidance.                 09/26/2021: Right LE Angiogram. PTA of the Right Posterior Tibial                Artery with 2.5 mm diameter by 30 cm length angioplasty balloon.                PTA of the distal bypass anastomosis and distal bypass graft                analogous to the Popliteal artery with 4 mm diameter by 8 cm                length Lutonix drug coated angioplasty balloon.  Vascular Interventions: Hx of multiple right lower extremity  PTA's/stents.                         09/10/2016 Right femoral to below the knee popliteal                         artery bypass graft placement. 5/01 Right bypass graft &                          mid popliteal artery PTA & coil embolectomy of large                         branch of bypass graft. 12/09/16 Right distal bypass                         graft PTA/stent and right posterior tibial artery PTA.                         03/23/2020 Rt PTA thrombectomy. Rt Profunda fem artery to                         PTA bypass. Performing Technologist: Delorise Shiner RVT  Examination Guidelines: A complete evaluation includes at minimum, Doppler waveform signals and systolic blood pressure reading at the level of bilateral brachial, anterior tibial, and posterior tibial arteries, when vessel segments are accessible. Bilateral testing is considered an integral part of a complete examination. Photoelectric Plethysmograph (PPG) waveforms and toe systolic pressure readings are included as required and additional duplex testing as needed. Limited examinations for reoccurring indications may be performed as noted.  ABI Findings: +---------+------------------+-----+----------+--------+ Right    Rt Pressure (mmHg)IndexWaveform  Comment  +---------+------------------+-----+----------+--------+ Brachial 147                                       +---------+------------------+-----+----------+--------+ ATA      33                0.22 monophasic         +---------+------------------+-----+----------+--------+ PTA      42                0.28 monophasic         +---------+------------------+-----+----------+--------+ Great Toe0                 0.00                    +---------+------------------+-----+----------+--------+ +---------+------------------+-----+---------+-------+ Left     Lt Pressure (mmHg)IndexWaveform Comment +---------+------------------+-----+---------+-------+ Brachial 152                                     +---------+------------------+-----+---------+-------+ ATA      184               1.21 triphasic         +---------+------------------+-----+---------+-------+ PTA      180               1.18 triphasic        +---------+------------------+-----+---------+-------+ Great Toe146               0.96                  +---------+------------------+-----+---------+-------+ +-------+-----------+-----------+------------+------------+  ABI/TBIToday's ABIToday's TBIPrevious ABIPrevious TBI +-------+-----------+-----------+------------+------------+ Right  0.28       0.00       1.06        0.97         +-------+-----------+-----------+------------+------------+ Left   1.21       0.96       1.33        1.25         +-------+-----------+-----------+------------+------------+ Right ABIs appear decreased compared to prior study on 10/23/2021. Left ABIs appear essentially unchanged compared to prior study on 10/23/2021.  Summary: Right: Resting right ankle-brachial index indicates critical limb ischemia. The right toe-brachial index is abnormal. Left: Resting left ankle-brachial index is within normal range. The left toe-brachial index is normal. *See table(s) above for measurements and observations.  Electronically signed by [name] MD on 03/10/2022 at 6:05:29 PM.    Final    MR Brain W Wo Contrast  Result Date: 02/19/2022 CLINICAL DATA:  Lung cancer with metastatic disease. Assess response to treatment. EXAM: MRI HEAD WITHOUT AND WITH CONTRAST TECHNIQUE: Multiplanar, multiecho pulse sequences of the brain and surrounding structures were obtained without and with intravenous contrast. CONTRAST:  53mL GADAVIST GADOBUTROL 1 MMOL/ML IV SOLN COMPARISON:  MRI head 12/10/2021 FINDINGS: Brain: 6 mm enhancing lesion left frontal parietal lobe has improved. Mild amount of surrounding edema has improved. Subtle enhancing lesion in the high left parietal lobe, 18/157 appears smaller and shows minimal enhancement. Enhancing lesion right occipital lobe measures approximately 3 mm and is smaller. No new lesions  identified. No acute infarct. Patchy white matter hyperintensities bilaterally consistent with chronic microvascular ischemia. Negative for hemorrhage or hydrocephalus. Vascular: Normal arterial flow voids at the skull base. Skull and upper cervical spine: Negative Sinuses/Orbits: Mild mucosal edema paranasal sinuses. 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 Decreased edema in the left parietal lobe. No new lesions. Electronically Signed   By: Franchot Gallo M.D.   On: 02/19/2022 18:09   DG Chest 2 View  Result Date: 02/11/2022 CLINICAL DATA:  History of pneumonitis.  Follow-up. EXAM: CHEST - 2 VIEW COMPARISON:  CT C AP January 17, 2022; chest radiograph January 14, 2021 FINDINGS: Right anterior chest wall Port-A-Cath is present with tip projecting over the superior vena cava. Redemonstrated right suprahilar masslike opacity. Scattered heterogeneous opacities right mid and lower lung. No pleural effusion or pneumothorax. Thoracic spine degenerative changes. IMPRESSION: Redemonstrated right mid and lower lung airspace opacities which may represent infectious process in the appropriate clinical setting. Alternative etiologies such as interstitial spread of tumor not excluded. Consider short-term follow-up chest CT. Electronically Signed   By: Lovey Newcomer M.D.   On: 02/11/2022 11:00

## 2022-04-25 NOTE — Assessment & Plan Note (Addendum)
Status post radiation.  02/19/22 MRI showed decreased brain lesion size and edema.  Continue surveillance every 3 months- MRI brain

## 2022-04-25 NOTE — Assessment & Plan Note (Signed)
Increase to Synthroid 100 mcg daily

## 2022-04-25 NOTE — Assessment & Plan Note (Signed)
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  Repeat CT scan showed improvement of patchy ground-glass opacities.  Reviewed and discussed with patient Labs reviewed and discussed with patient. Proceed with Durvalumab

## 2022-04-25 NOTE — Patient Instructions (Signed)
[facility] [place]  Discharge Instructions: Thank you for choosing [facility] to provide your oncology and hematology care.  If you have a lab appointment with the [facility], please go directly to the [facility] and check in at the registration area.  Wear comfortable clothing and clothing appropriate for easy access to any Portacath or PICC line.   We strive to give you quality time with your provider. You may need to reschedule your appointment if you arrive late (15 or more minutes).  Arriving late affects you and other patients whose appointments are after yours.  Also, if you miss three or more appointments without notifying the office, you may be dismissed from the clinic at the provider's discretion.      For prescription refill requests, have your pharmacy contact our office and allow 72 hours for refills to be completed.    Today you received the following chemotherapy and/or immunotherapy agents Imfinzi      To help prevent nausea and vomiting after your treatment, we encourage you to take your nausea medication as directed.  BELOW ARE SYMPTOMS THAT SHOULD BE REPORTED IMMEDIATELY: *FEVER GREATER THAN 100.4 F (38 C) OR HIGHER *CHILLS OR SWEATING *NAUSEA AND VOMITING THAT IS NOT CONTROLLED WITH YOUR NAUSEA MEDICATION *UNUSUAL SHORTNESS OF BREATH *UNUSUAL BRUISING OR BLEEDING *URINARY PROBLEMS (pain or burning when urinating, or frequent urination) *BOWEL PROBLEMS (unusual diarrhea, constipation, pain near the anus) TENDERNESS IN MOUTH AND THROAT WITH OR WITHOUT PRESENCE OF ULCERS (sore throat, sores in mouth, or a toothache) UNUSUAL RASH, SWELLING OR PAIN  UNUSUAL VAGINAL DISCHARGE OR ITCHING   Items with * indicate a potential emergency and should be followed up as soon as possible or go to the Emergency Department if any problems should occur.  Please show the CHEMOTHERAPY ALERT CARD or IMMUNOTHERAPY ALERT CARD at check-in to the  Emergency Department and triage nurse.  Should you have questions after your visit or need to cancel or reschedule your appointment, please contact [facility] [place] [facility] [place]  [phone] and follow the prompts.  Office hours are 8:00 a.m. to 4:30 p.m. Monday - Friday. Please note that voicemails left after 4:00 p.m. may not be returned until the following business day.  We are closed weekends and major holidays. You have access to a nurse at all times for urgent questions. Please call the main number to the clinic [phone] and follow the prompts.  For any non-urgent questions, you may also contact your provider using MyChart. We now offer e-Visits for anyone 50 and older to request care online for non-urgent symptoms. For details visit [email or web address].   Also download the MyChart app! Go to the app store, search "MyChart", open the app, select [facility], and log in with your MyChart username and password.  Masks are optional in the cancer centers. If you would like for your care team to wear a mask while they are taking care of you, please let them know. For doctor visits, patients may have with them one support person who is at least 64 years old. At this time, visitors are not allowed in the infusion area.

## 2022-05-04 NOTE — Progress Notes (Unsigned)
MRN : [id number]  Joshua [name] is a 66 y.o. (19-Sep-1955) male who presents with chief complaint of check circulation.  History of Present Illness:   The patient returns to the office for followup and review status post angiogram with intervention on 04/08/2022.   Procedure: Percutaneous transluminal angioplasty right profunda femoris artery Mechanical thrombectomy of the right profunda femoris artery using the Rota Rex catheter.   The patient notes improvement in the lower extremity symptoms. No interval shortening of the patient's claudication distance or rest pain symptoms. No new ulcers or wounds have occurred since the last visit.  There have been no significant changes to the patient's overall health care.  No documented history of amaurosis fugax or recent TIA symptoms. There are no recent neurological changes noted. No documented history of DVT, PE or superficial thrombophlebitis. The patient denies recent episodes of angina or shortness of breath.   ABI's Rt=0.65 and Lt=1.19  (previous ABI's Rt=0.28 and Lt=1.21)  No outpatient medications have been marked as taking for the 05/05/22 encounter (Appointment) with Delana Meyer, Dolores Lory, MD.    Past Medical History:  Diagnosis Date   Allergy    Cancer ([place])    lung   COPD (chronic obstructive pulmonary disease) ([place])    Dyspnea    former smoker. only doe   History of kidney stones    Hypertension    Peripheral vascular disease ([place])    Pre-diabetes    Sleep apnea     Past Surgical History:  Procedure Laterality Date   ANTERIOR CERVICAL DECOMP/DISCECTOMY FUSION N/A 03/14/2020   Procedure: ANTERIOR CERVICAL DECOMPRESSION/DISCECTOMY FUSION 3 LEVELS C4-7;  Surgeon: Meade Maw, MD;  Location: [facility] ORS;  Service: Neurosurgery;  Laterality: N/A;   BACK SURGERY  2011   CENTRAL LINE INSERTION Right 09/10/2016   Procedure: CENTRAL LINE INSERTION;  Surgeon: Katha Cabal, MD;  Location: [facility]  ORS;  Service: Vascular;  Laterality: Right;   CERVICAL FUSION     C4-c7 on Mar 14, 2020   COLONOSCOPY  2013 ?   COLONOSCOPY N/A 09/09/2021   Procedure: COLONOSCOPY;  Surgeon: [name], DO;  Location: [facility] ENDOSCOPY;  Service: Gastroenterology;  Laterality: N/A;   CORONARY ANGIOPLASTY     ESOPHAGOGASTRODUODENOSCOPY N/A 09/09/2021   Procedure: ESOPHAGOGASTRODUODENOSCOPY (EGD);  Surgeon: [name], DO;  Location: [facility] ENDOSCOPY;  Service: Gastroenterology;  Laterality: N/A;   FEMORAL-POPLITEAL BYPASS GRAFT Right 09/10/2016   Procedure: BYPASS GRAFT FEMORAL-POPLITEAL ARTERY ( BELOW KNEE );  Surgeon: Katha Cabal, MD;  Location: [facility] ORS;  Service: Vascular;  Laterality: Right;   FEMORAL-TIBIAL BYPASS GRAFT Right 03/23/2020   Procedure: BYPASS GRAFT RIGHT FEMORAL- DISTAL TIBIAL ARTERY WITH DISTAL FLOW GRAFT;  Surgeon: Katha Cabal, MD;  Location: [facility] ORS;  Service: Vascular;  Laterality: Right;   LEFT HEART CATH AND CORONARY ANGIOGRAPHY Left 05/16/2021   Procedure: LEFT HEART CATH AND CORONARY ANGIOGRAPHY;  Surgeon: Andrez Grime, MD;  Location: [place] CV LAB;  Service: Cardiovascular;  Laterality: Left;   LOWER EXTREMITY ANGIOGRAPHY Right 11/18/2016   Procedure: Lower Extremity Angiography;  Surgeon: Katha Cabal, MD;  Location: [place] [facility] LAB;  Service: Cardiovascular;  Laterality: Right;   LOWER EXTREMITY ANGIOGRAPHY Right 12/09/2016   Procedure: Lower Extremity Angiography;  Surgeon: Katha Cabal, MD;  Location: [place] [facility] LAB;  Service: Cardiovascular;  Laterality: Right;   LOWER EXTREMITY ANGIOGRAPHY Right 11/15/2019  Procedure: LOWER EXTREMITY ANGIOGRAPHY;  Surgeon: Katha Cabal, MD;  Location: [place] [facility] LAB;  Service: Cardiovascular;  Laterality: Right;   LOWER EXTREMITY ANGIOGRAPHY Right 12/01/2019   Procedure: Lower Extremity Angiography;  Surgeon: Algernon Huxley, MD;  Location: [place] [facility] LAB;  Service:  Cardiovascular;  Laterality: Right;   LOWER EXTREMITY ANGIOGRAPHY Right 12/02/2019   Procedure: LOWER EXTREMITY ANGIOGRAPHY;  Surgeon: Katha Cabal, MD;  Location: [place] [facility] LAB;  Service: Cardiovascular;  Laterality: Right;   LOWER EXTREMITY ANGIOGRAPHY Right 03/23/2020   Procedure: Lower Extremity Angiography;  Surgeon: Katha Cabal, MD;  Location: [place] [facility];  Service: Cardiovascular;  Laterality: Right;   LOWER EXTREMITY ANGIOGRAPHY Right 09/25/2021   Procedure: Lower Extremity Angiography;  Surgeon: Katha Cabal, MD;  Location: [place] [facility];  Service: Cardiovascular;  Laterality: Right;   LOWER EXTREMITY ANGIOGRAPHY Right 09/26/2021   Procedure: Lower Extremity Angiography;  Surgeon: Algernon Huxley, MD;  Location: [place] [facility];  Service: Cardiovascular;  Laterality: Right;   LOWER EXTREMITY ANGIOGRAPHY Right 04/08/2022   Procedure: Lower Extremity Angiography;  Surgeon: Katha Cabal, MD;  Location: [place] [facility];  Service: Cardiovascular;  Laterality: Right;   LOWER EXTREMITY INTERVENTION  11/18/2016   Procedure: Lower Extremity Intervention;  Surgeon: Katha Cabal, MD;  Location: [place] [facility];  Service: Cardiovascular;;   PERIPHERAL VASCULAR CATHETERIZATION Right 01/02/2015   Procedure: Lower Extremity Angiography;  Surgeon: Katha Cabal, MD;  Location: [place] [facility];  Service: Cardiovascular;  Laterality: Right;   PERIPHERAL VASCULAR CATHETERIZATION Right 06/18/2015   Procedure: Lower Extremity Angiography;  Surgeon: Algernon Huxley, MD;  Location: [place] [facility];  Service: Cardiovascular;  Laterality: Right;   PERIPHERAL VASCULAR CATHETERIZATION  06/18/2015   Procedure: Lower Extremity Intervention;  Surgeon: Algernon Huxley, MD;  Location: [place] [facility];  Service: Cardiovascular;;   PERIPHERAL VASCULAR CATHETERIZATION N/A 10/09/2015   Procedure: Abdominal Aortogram w/Lower Extremity;  Surgeon: Katha Cabal, MD;  Location: [place] [facility];  Service: Cardiovascular;  Laterality: N/A;   PERIPHERAL VASCULAR CATHETERIZATION  10/09/2015   Procedure: Lower Extremity Intervention;  Surgeon: Katha Cabal, MD;  Location: [place] [facility];  Service: Cardiovascular;;   PERIPHERAL VASCULAR CATHETERIZATION Right 10/10/2015   Procedure: Lower Extremity Angiography;  Surgeon: Algernon Huxley, MD;  Location: [place] [facility];  Service: Cardiovascular;  Laterality: Right;   PERIPHERAL VASCULAR CATHETERIZATION Right 10/10/2015   Procedure: Lower Extremity Intervention;  Surgeon: Algernon Huxley, MD;  Location: [place] [facility];  Service: Cardiovascular;  Laterality: Right;   PERIPHERAL VASCULAR CATHETERIZATION Right 12/03/2015   Procedure: Lower Extremity Angiography;  Surgeon: Algernon Huxley, MD;  Location: [place] [facility];  Service: Cardiovascular;  Laterality: Right;   PERIPHERAL VASCULAR CATHETERIZATION Right 12/04/2015   Procedure: Lower Extremity Angiography;  Surgeon: Algernon Huxley, MD;  Location: [place] [facility];  Service: Cardiovascular;  Laterality: Right;   PERIPHERAL VASCULAR CATHETERIZATION  12/04/2015   Procedure: Lower Extremity Intervention;  Surgeon: Algernon Huxley, MD;  Location: [place] [facility];  Service: Cardiovascular;;   PERIPHERAL VASCULAR CATHETERIZATION Right 06/30/2016   Procedure: Lower Extremity Angiography;  Surgeon: Algernon Huxley, MD;  Location: [place] [facility];  Service: Cardiovascular;  Laterality: Right;   PERIPHERAL VASCULAR CATHETERIZATION Right 06/25/2016   Procedure: Lower Extremity Angiography;  Surgeon: Katha Cabal, MD;  Location: [place] [facility];  Service: Cardiovascular;  Laterality: Right;   PERIPHERAL VASCULAR CATHETERIZATION Right 07/01/2016   Procedure: Lower Extremity Angiography;  Surgeon: Katha Cabal, MD;  Location: [place] [facility];  Service: Cardiovascular;  Laterality: Right;   PERIPHERAL VASCULAR CATHETERIZATION   07/01/2016   Procedure: Lower Extremity Intervention;  Surgeon: Katha Cabal, MD;  Location: [place] [facility];  Service: Cardiovascular;;   PERIPHERAL VASCULAR CATHETERIZATION Right 08/01/2016   Procedure: Lower Extremity Angiography;  Surgeon: Katha Cabal, MD;  Location: [place] [facility];  Service: Cardiovascular;  Laterality: Right;   PORTA CATH INSERTION N/A 10/05/2020   Procedure: PORTA CATH INSERTION;  Surgeon: Katha Cabal, MD;  Location: [place] [facility];  Service: Cardiovascular;  Laterality: N/A;   stent placement in right leg Right    VIDEO BRONCHOSCOPY N/A 09/14/2020   Procedure: VIDEO BRONCHOSCOPY WITH FLUORO;  Surgeon: Tyler Pita, MD;  Location: [facility] ORS;  Service: Cardiopulmonary;  Laterality: N/A;   VIDEO BRONCHOSCOPY WITH ENDOBRONCHIAL ULTRASOUND N/A 09/14/2020   Procedure: VIDEO BRONCHOSCOPY WITH ENDOBRONCHIAL ULTRASOUND;  Surgeon: Tyler Pita, MD;  Location: [facility] ORS;  Service: Cardiopulmonary;  Laterality: N/A;    Social History Social History   Tobacco Use   Smoking status: Former    Packs/day: 2.00    Years: 38.00    Total pack years: 76.00    Types: Cigarettes    Quit date: 12/20/2014    Years since quitting: 7.3   Smokeless tobacco: Never   Tobacco comments:    quit smoking in 2017 most smoked 2   Vaping Use   Vaping Use: Former  Substance Use Topics   Alcohol use: Not Currently   Drug use: No    Family History Family History  Problem Relation Age of Onset   Diabetes Mother    Hypertension Mother    Heart murmur Mother    Leukemia Mother    Throat cancer Maternal Grandmother    Colon cancer Neg Hx    Breast cancer Neg Hx     No Known Allergies   REVIEW OF SYSTEMS (Negative unless checked)  Constitutional: [] Weight loss  [] Fever  [] Chills Cardiac: [] Chest pain   [] Chest pressure   [] Palpitations   [] Shortness of breath when laying flat   [] Shortness of breath with exertion. Vascular:  [x] Pain in legs with  walking   [] Pain in legs at rest  [] History of DVT   [] Phlebitis   [] Swelling in legs   [] Varicose veins   [] Non-healing ulcers Pulmonary:   [] Uses home oxygen   [] Productive cough   [] Hemoptysis   [] Wheeze  [] COPD   [] Asthma Neurologic:  [] Dizziness   [] Seizures   [] History of stroke   [] History of TIA  [] Aphasia   [] Vissual changes   [] Weakness or numbness in arm   [] Weakness or numbness in leg Musculoskeletal:   [] Joint swelling   [] Joint pain   [] Low back pain Hematologic:  [] Easy bruising  [] Easy bleeding   [] Hypercoagulable state   [] Anemic Gastrointestinal:  [] Diarrhea   [] Vomiting  [] Gastroesophageal reflux/heartburn   [] Difficulty swallowing. Genitourinary:  [] Chronic kidney disease   [] Difficult urination  [] Frequent urination   [] Blood in urine Skin:  [] Rashes   [] Ulcers  Psychological:  [] History of anxiety   []  History of major depression.  Physical Examination  There were no vitals filed for this visit. There is no height or weight on file to calculate BMI. Gen: WD/WN, NAD Head: [place]/AT, No temporalis wasting.  Ear/Nose/Throat: Hearing grossly intact, nares w/o erythema or  drainage Eyes: PER, EOMI, sclera nonicteric.  Neck: Supple, no masses.  No bruit or JVD.  Pulmonary:  Good air movement, no audible wheezing, no use of accessory muscles.  Cardiac: RRR, normal S1, S2, no Murmurs. Vascular:  mild trophic changes, no open wounds Vessel Right Left  Radial Palpable Palpable  PT Not Palpable  Palpable  DP Not Palpable  Palpable  Gastrointestinal: soft, non-distended. No guarding/no peritoneal signs.  Musculoskeletal: M/S 5/5 throughout.  No visible deformity.  Neurologic: CN 2-12 intact. Pain and light touch intact in extremities.  Symmetrical.  Speech is fluent. Motor exam as listed above. Psychiatric: Judgment intact, Mood & affect appropriate for pt's clinical situation. Dermatologic: No rashes or ulcers noted.  No changes consistent with cellulitis.   CBC Lab Results   Component Value Date   WBC 5.1 04/25/2022   HGB 12.0 (L) 04/25/2022   HCT 37.3 (L) 04/25/2022   MCV 86.3 04/25/2022   PLT 212 04/25/2022    BMET    Component Value Date/Time   NA 143 04/25/2022 0901   NA 141 10/14/2019 1101   K 3.4 (L) 04/25/2022 0901   CL 109 04/25/2022 0901   CO2 28 04/25/2022 0901   GLUCOSE 128 (H) 04/25/2022 0901   BUN 16 04/25/2022 0901   BUN 15 10/14/2019 1101   BUN 16 07/03/2014 1205   CREATININE 1.22 04/25/2022 0901   CREATININE 1.34 (H) 07/03/2014 1205   CALCIUM 9.2 04/25/2022 0901   GFRNONAA >60 04/25/2022 0901   GFRNONAA 59 (L) 07/03/2014 1205   GFRAA >60 03/30/2020 0536   GFRAA >60 07/03/2014 1205   Estimated Creatinine Clearance: 74.4 mL/min (by C-G formula based on SCr of 1.22 mg/dL).  COAG Lab Results  Component Value Date   INR 1.2 09/26/2021   INR 1.2 08/25/2020   INR 1.0 03/05/2020    Radiology PERIPHERAL VASCULAR CATHETERIZATION  Result Date: 04/08/2022 See surgical note for result.    Assessment/Plan 1. Atherosclerosis of lower extremity with claudication ([place]) Recommend:  The patient is status post successful angiogram with intervention.  The patient reports that the claudication symptoms and leg pain has improved.   The patient denies lifestyle limiting changes at this point in time.  No further invasive studies, angiography or surgery at this time The patient should continue walking and begin a more formal exercise program.  The patient should continue antiplatelet therapy and aggressive treatment of the lipid abnormalities  Continued surveillance is indicated as atherosclerosis is likely to progress with time.    Patient should undergo noninvasive studies as ordered. The patient will follow up with me to review the studies.  - VAS [place] ABI WITH/WO TBI - VAS [place] ABI WITH/WO TBI; Future  2. Chronic obstructive pulmonary disease, unspecified COPD type ([place]) Continue pulmonary medications and aerosols as already  ordered, these medications have been reviewed and there are no changes at this time.   3. Gastroesophageal reflux disease without esophagitis Continue PPI as already ordered, this medication has been reviewed and there are no changes at this time.  Avoidence of caffeine and alcohol  Moderate elevation of the head of the bed   4. Mixed hyperlipidemia Continue statin as ordered and reviewed, no changes at this time   [name], MD  05/04/2022 11:02 AM

## 2022-05-05 ENCOUNTER — Encounter (INDEPENDENT_AMBULATORY_CARE_PROVIDER_SITE_OTHER): Payer: Self-pay | Admitting: Vascular Surgery

## 2022-05-05 ENCOUNTER — Ambulatory Visit (INDEPENDENT_AMBULATORY_CARE_PROVIDER_SITE_OTHER): Payer: Medicare Other

## 2022-05-05 ENCOUNTER — Ambulatory Visit (INDEPENDENT_AMBULATORY_CARE_PROVIDER_SITE_OTHER): Payer: Medicare Other | Admitting: Vascular Surgery

## 2022-05-05 ENCOUNTER — Ambulatory Visit: Payer: Medicare Other | Admitting: Radiation Oncology

## 2022-05-05 VITALS — BP 129/77 | HR 86 | Resp 16 | Wt 215.6 lb

## 2022-05-05 DIAGNOSIS — I70219 Atherosclerosis of native arteries of extremities with intermittent claudication, unspecified extremity: Secondary | ICD-10-CM

## 2022-05-05 DIAGNOSIS — E782 Mixed hyperlipidemia: Secondary | ICD-10-CM | POA: Diagnosis not present

## 2022-05-05 DIAGNOSIS — K219 Gastro-esophageal reflux disease without esophagitis: Secondary | ICD-10-CM

## 2022-05-05 DIAGNOSIS — J449 Chronic obstructive pulmonary disease, unspecified: Secondary | ICD-10-CM | POA: Diagnosis not present

## 2022-05-07 ENCOUNTER — Ambulatory Visit (INDEPENDENT_AMBULATORY_CARE_PROVIDER_SITE_OTHER): Payer: Medicare Other | Admitting: Pulmonary Disease

## 2022-05-07 ENCOUNTER — Encounter: Payer: Self-pay | Admitting: Pulmonary Disease

## 2022-05-07 ENCOUNTER — Encounter (INDEPENDENT_AMBULATORY_CARE_PROVIDER_SITE_OTHER): Payer: Self-pay | Admitting: Vascular Surgery

## 2022-05-07 VITALS — BP 130/74 | HR 86 | Temp 97.5°F | Ht 71.0 in | Wt 216.2 lb

## 2022-05-07 DIAGNOSIS — C3490 Malignant neoplasm of unspecified part of unspecified bronchus or lung: Secondary | ICD-10-CM

## 2022-05-07 DIAGNOSIS — J984 Other disorders of lung: Secondary | ICD-10-CM

## 2022-05-07 DIAGNOSIS — J4489 Other specified chronic obstructive pulmonary disease: Secondary | ICD-10-CM | POA: Diagnosis not present

## 2022-05-07 DIAGNOSIS — J439 Emphysema, unspecified: Secondary | ICD-10-CM

## 2022-05-07 DIAGNOSIS — C7931 Secondary malignant neoplasm of brain: Secondary | ICD-10-CM

## 2022-05-07 NOTE — Patient Instructions (Signed)
Continue taking the Bevespi inhaler 2 puffs twice a day.  We will see you in follow-up in 4 months time call sooner should any new problems arise.

## 2022-05-07 NOTE — Progress Notes (Signed)
Subjective:    Patient ID: Joshua [name], male    DOB: 1956-05-05, 66 y.o.   MRN: [id number] Patient Care Team: [name], MD as PCP - General (Physician Assistant) [name], MD as Referring Physician (Physician Assistant) Bary Castilla, [name], MD (General Surgery) Telford Nab, RN as Oncology Nurse Navigator [name], MD as Consulting Physician (Oncology)  Chief Complaint  Patient presents with   Follow-up    COPD/Emphysema. SOB with exertion. Occasional wheezing. No cough.    HPI Patient is a 66 year old former smoker (32 PY) with a history of COPD, shortness of breath on exertion and squamous cell carcinoma of the right upper lobe, stage IV disease.  The patient received concomitant chemoradiation followed by durvalumab maintenance as he is PD-L1 positive.  He had a break on durvalumab therapy due to groundglass opacities noted on CT chest that were consistent with perhaps point inhibitor pneumonitis.  Most recent CT chest performed on 15 September showed marked improvement on the groundglass opacities.  Patient has since restarted durvalumab.  He has not had any untoward side effects.  His disease was initially staged as a III A however in March 2023 he was noted to have 2 enhancing lesions in the brain and is now staged at a IV.   Has not had any fevers, chills or sweats since his last visit. He is still receiving durvalumab and has completed radiation therapy to the brain.  He is on Eliquis and Plavix for his severe peripheral artery disease status post thrombosis and subsequent thrombectomy of the right lower extremity.  Previously he had been on Stiolto however because of insurance coverage this has been switched to Bevespi 2 puffs twice a day.  He notes that this is just as effective as Stiolto.  He continues to complain of shortness of breath however still feels that the [place] does help.  He has not had any cough or sputum production.  No  hemoptysis.  Overall today he looks the best I have seen him in quite a while.  He was also more engaging.     DATA 07/18/2021 PFTs: FEV1 2.80 L or 76% predicted, FVC 3.61 L or 74% predicted, there is a small airways component.  No significant bronchodilator response.  Lung volumes mildly reduced.  Diffusion capacity 71% by K[place].   Review of Systems A 10 point review of systems was performed and it is as noted above otherwise negative.  Patient Active Problem List   Diagnosis Date Noted   GERD (gastroesophageal reflux disease) 01/25/2022   Pneumonitis 01/25/2022   COPD (chronic obstructive pulmonary disease) ([place]) 12/28/2021   Hypocalcemia 12/28/2021   Malignant neoplasm metastatic to brain ([place]) 10/08/2021   Hypothyroidism, unspecified 10/08/2021   Parotid nodule 08/16/2021   Dysphagia 06/20/2021   COVID 06/20/2021   Organizing pneumonia ([place]) 04/04/2021   Hypothyroidism due to medication 04/04/2021   Encounter for antineoplastic immunotherapy 04/04/2021   Antineoplastic chemotherapy induced anemia 10/17/2020   Squamous cell carcinoma of right lung ([place]) 09/20/2020   Goals of care, counseling/discussion 08/30/2020   Iron deficiency anemia 08/30/2020   Ischemia of extremity 03/23/2020   Cervical myelopathy ([place]) 03/14/2020   Ischemia of right lower extremity 11/15/2019   Peripheral neuropathy 10/14/2019   Chronic left-sided low back pain without sciatica 04/13/2019   History of back surgery 04/13/2019   Atherosclerosis of lower extremity with claudication ([place]) 49/70/2637   Complication associated with vascular device 11/13/2016   Hyperlipidemia, unspecified 06/18/2016   Radiculopathy of  lumbar region 06/10/2016   PVD (peripheral vascular disease) ([place]) 05/08/2016   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   aspirin EC 81 MG tablet Take 81 mg by mouth daily. Swallow whole.   clopidogrel (PLAVIX) 75 MG tablet TAKE 1 TABLET BY MOUTH  DAILY   Glycopyrrolate-Formoterol (BEVESPI AEROSPHERE) 9-4.8 MCG/ACT AERO Inhale 2 puffs into the lungs 2 (two) times daily.   levETIRAcetam (KEPPRA) 500 MG tablet Take 1 tablet (500 mg total) by mouth 2 (two) times daily.   levothyroxine (SYNTHROID) 100 MCG tablet Take 1 tablet (100 mcg total) by mouth daily before breakfast.   simvastatin (ZOCOR) 40 MG tablet Take 40 mg by mouth at bedtime.   Immunization History  Administered Date(s) Administered   Influenza,inj,Quad PF,6+ Mos 09/14/2016, 04/13/2019, 04/16/2020   Influenza-Unspecified 09/14/2016, 04/13/2019, 04/16/2020, 04/02/2022   PFIZER Comirnaty(Gray Top)Covid-19 Tri-Sucrose Vaccine 10/20/2019, 11/29/2019   PFIZER(Purple Top)SARS-COV-2 Vaccination 10/20/2019, 11/29/2019   Tdap 04/16/2020       Objective:   Physical Exam BP 130/74 (BP Location: Left Arm, Cuff Size: Normal)   Pulse 86   Temp (!) 97.5 F (36.4 C)   Ht $R'5\' 11"'jq$  (1.803 m)   Wt 216 lb 3.2 oz (98.1 kg)   SpO2 99%   BMI 30.15 kg/m  GENERAL: Chronically ill-appearing well-developed well-nourished gentleman, mildly tachypneic.  Ambulatory with assistance of cane.   HEAD: Normocephalic, atraumatic.  EYES: Pupils equal, round, reactive to light.  No scleral icterus.  MOUTH: Oral mucosa moist, no thrush. NECK: Supple.  Limited ROM due to prior spinal fusion.  No thyromegaly. Trachea midline. No JVD.  No adenopathy. PULMONARY: Good air entry bilaterally.  Few  rhonchi, few end expiratory wheezes. CARDIOVASCULAR: S1 and S2. Regular rate and rhythm.  No rubs, murmurs or gallops heard. ABDOMEN: Benign. MUSCULOSKELETAL: No joint deformity, no clubbing, no edema.  NEUROLOGIC: Mild psychomotor retardation noted, mild unsteady gait. SKIN: Intact,warm,dry. PSYCH: Taciturn but cooperative.      Assessment & Plan:     ICD-10-CM   1. COPD with chronic bronchitis and emphysema ([place])  J44.89    J43.9    Continue Bevespi 2 puffs twice a day Does not wish to have albuterol states it is ineffective     2. Primary malignant neoplasm of lung with metastasis to brain [facility])  C34.90    C79.31    On durvalumab This issue adds complexity to his management Follows with oncology    3. Pneumonitis  J98.4    Improved by most recent CT Was likely related to the durvalumab Close monitoring     We will see the patient in follow-up in 4 months time he is to call sooner should any new difficulties arise.  Renold Don, MD Advanced Bronchoscopy PCCM [facility]    *This note was dictated using voice  recognition software/Dragon.  Despite best efforts to proofread, errors can occur which can change the meaning. Any transcriptional errors that result from this process are unintentional and may not be fully corrected at the time of dictation.

## 2022-05-09 ENCOUNTER — Inpatient Hospital Stay: Payer: Medicare Other

## 2022-05-09 ENCOUNTER — Encounter: Payer: Self-pay | Admitting: Oncology

## 2022-05-09 ENCOUNTER — Inpatient Hospital Stay (HOSPITAL_BASED_OUTPATIENT_CLINIC_OR_DEPARTMENT_OTHER): Payer: Medicare Other | Admitting: Oncology

## 2022-05-09 VITALS — BP 122/81 | HR 100 | Temp 96.7°F | Resp 19 | Wt 218.8 lb

## 2022-05-09 DIAGNOSIS — E032 Hypothyroidism due to medicaments and other exogenous substances: Secondary | ICD-10-CM

## 2022-05-09 DIAGNOSIS — C3491 Malignant neoplasm of unspecified part of right bronchus or lung: Secondary | ICD-10-CM | POA: Diagnosis not present

## 2022-05-09 DIAGNOSIS — I739 Peripheral vascular disease, unspecified: Secondary | ICD-10-CM

## 2022-05-09 DIAGNOSIS — Z5112 Encounter for antineoplastic immunotherapy: Secondary | ICD-10-CM | POA: Diagnosis not present

## 2022-05-09 DIAGNOSIS — C7931 Secondary malignant neoplasm of brain: Secondary | ICD-10-CM | POA: Diagnosis not present

## 2022-05-09 LAB — COMPREHENSIVE METABOLIC PANEL
ALT: 24 U/L (ref 0–44)
AST: 29 U/L (ref 15–41)
Albumin: 3.9 g/dL (ref 3.5–5.0)
Alkaline Phosphatase: 66 U/L (ref 38–126)
Anion gap: 6 (ref 5–15)
BUN: 14 mg/dL (ref 8–23)
CO2: 25 mmol/L (ref 22–32)
Calcium: 8.8 mg/dL — ABNORMAL LOW (ref 8.9–10.3)
Chloride: 110 mmol/L (ref 98–111)
Creatinine, Ser: 0.96 mg/dL (ref 0.61–1.24)
GFR, Estimated: >60 mL/min (ref >60)
Glucose, Bld: 163 mg/dL — ABNORMAL HIGH (ref 70–99)
Potassium: 3.4 mmol/L — ABNORMAL LOW (ref 3.5–5.1)
Sodium: 141 mmol/L (ref 135–145)
Total Bilirubin: 0.3 mg/dL (ref 0.3–1.2)
Total Protein: 6.9 g/dL (ref 6.5–8.1)

## 2022-05-09 LAB — CBC WITH DIFFERENTIAL/PLATELET
Abs Immature Granulocytes: 0.01 10*3/uL (ref 0.00–0.07)
Basophils Absolute: 0.1 10*3/uL (ref 0.0–0.1)
Basophils Relative: 2 %
Eosinophils Absolute: 0.3 10*3/uL (ref 0.0–0.5)
Eosinophils Relative: 7 %
HCT: 38.6 % — ABNORMAL LOW (ref 39.0–52.0)
Hemoglobin: 12.4 g/dL — ABNORMAL LOW (ref 13.0–17.0)
Immature Granulocytes: 0 %
Lymphocytes Relative: 18 %
Lymphs Abs: 0.8 10*3/uL (ref 0.7–4.0)
MCH: 28.4 pg (ref 26.0–34.0)
MCHC: 32.1 g/dL (ref 30.0–36.0)
MCV: 88.3 fL (ref 80.0–100.0)
Monocytes Absolute: 0.6 10*3/uL (ref 0.1–1.0)
Monocytes Relative: 13 %
Neutro Abs: 2.6 10*3/uL (ref 1.7–7.7)
Neutrophils Relative %: 60 %
Platelets: 209 10*3/uL (ref 150–400)
RBC: 4.37 MIL/uL (ref 4.22–5.81)
RDW: 14.5 % (ref 11.5–15.5)
WBC: 4.3 10*3/uL (ref 4.0–10.5)
nRBC: 0 % (ref 0.0–0.2)

## 2022-05-09 LAB — TSH: TSH: 32.916 u[IU]/mL — ABNORMAL HIGH (ref 0.350–4.500)

## 2022-05-09 MED ORDER — SODIUM CHLORIDE 0.9 % IV SOLN
10.0000 mg/kg | Freq: Once | INTRAVENOUS | Status: AC
  Administered 2022-05-09: 1000 mg via INTRAVENOUS
  Filled 2022-05-09: qty 20

## 2022-05-09 MED ORDER — SODIUM CHLORIDE 0.9 % IV SOLN
Freq: Once | INTRAVENOUS | Status: AC
  Filled 2022-05-09: qty 250

## 2022-05-09 MED ORDER — HEPARIN SOD (PORK) LOCK FLUSH 100 UNIT/ML IV SOLN
500.0000 [IU] | Freq: Once | INTRAVENOUS | Status: AC | PRN
  Administered 2022-05-09: 500 [IU]
  Filled 2022-05-09: qty 5

## 2022-05-09 NOTE — Assessment & Plan Note (Signed)
Status post radiation.  02/19/22 MRI showed decreased brain lesion size and edema.  Continue surveillance every 3 months- MRI brain

## 2022-05-09 NOTE — Assessment & Plan Note (Signed)
Continue Plavix and follow up with vascular surgery. Dr.Schnier recently stopped Eliquis

## 2022-05-09 NOTE — Patient Instructions (Signed)
[facility] CTR AT [facility]  Discharge Instructions: Thank you for choosing [facility] to provide your oncology and hematology care.  If you have a lab appointment with the [facility], please go directly to the [facility] and check in at the registration area.  Wear comfortable clothing and clothing appropriate for easy access to any Portacath or PICC line.   We strive to give you quality time with your provider. You may need to reschedule your appointment if you arrive late (15 or more minutes).  Arriving late affects you and other patients whose appointments are after yours.  Also, if you miss three or more appointments without notifying the office, you may be dismissed from the clinic at the provider's discretion.      For prescription refill requests, have your pharmacy contact our office and allow 72 hours for refills to be completed.    Today you received the following chemotherapy and/or immunotherapy agents: Durvalumab      To help prevent nausea and vomiting after your treatment, we encourage you to take your nausea medication as directed.  BELOW ARE SYMPTOMS THAT SHOULD BE REPORTED IMMEDIATELY: *FEVER GREATER THAN 100.4 F (38 C) OR HIGHER *CHILLS OR SWEATING *NAUSEA AND VOMITING THAT IS NOT CONTROLLED WITH YOUR NAUSEA MEDICATION *UNUSUAL SHORTNESS OF BREATH *UNUSUAL BRUISING OR BLEEDING *URINARY PROBLEMS (pain or burning when urinating, or frequent urination) *BOWEL PROBLEMS (unusual diarrhea, constipation, pain near the anus) TENDERNESS IN MOUTH AND THROAT WITH OR WITHOUT PRESENCE OF ULCERS (sore throat, sores in mouth, or a toothache) UNUSUAL RASH, SWELLING OR PAIN  UNUSUAL VAGINAL DISCHARGE OR ITCHING   Items with * indicate a potential emergency and should be followed up as soon as possible or go to the Emergency Department if any problems should occur.  Please show the CHEMOTHERAPY ALERT CARD or IMMUNOTHERAPY ALERT CARD at check-in to  the Emergency Department and triage nurse.  Should you have questions after your visit or need to cancel or reschedule your appointment, please contact [facility] [place] AT [place]  [phone] and follow the prompts.  Office hours are 8:00 a.m. to 4:30 p.m. Monday - Friday. Please note that voicemails left after 4:00 p.m. may not be returned until the following business day.  We are closed weekends and major holidays. You have access to a nurse at all times for urgent questions. Please call the main number to the clinic [phone] and follow the prompts.  For any non-urgent questions, you may also contact your provider using MyChart. We now offer e-Visits for anyone 69 and older to request care online for non-urgent symptoms. For details visit [email or web address].   Also download the MyChart app! Go to the app store, search "MyChart", open the app, select [facility], and log in with your MyChart username and password.  Masks are optional in the cancer centers. If you would like for your care team to wear a mask while they are taking care of you, please let them know. For doctor visits, patients may have with them one support person who is at least 64 years old. At this time, visitors are not allowed in the infusion area.

## 2022-05-09 NOTE — Assessment & Plan Note (Signed)
Synthroid 100 mcg daily

## 2022-05-09 NOTE — Assessment & Plan Note (Signed)
Recommend patient to take Calcium 1200mg  daily.

## 2022-05-09 NOTE — Progress Notes (Signed)
Hematology/Oncology Progress note [phone] [phone]    ASSESSMENT & PLAN:   Cancer Staging  Squamous cell carcinoma of right lung [facility]) Staging form: Lung, AJCC 8th Edition - Clinical stage from 09/20/2020: Stage IV (cT1b, cN2, cM1) - Signed by [name], MD on 12/13/2021   Squamous cell carcinoma of right lung () Durvalumab was previously held due to pneumonitis.  Repeat CT scan showed improvement of patchy ground-glass opacities.  Reviewed and discussed with patient Labs reviewed and discussed with patient. Proceed with Durvalumab  Malignant neoplasm metastatic to brain [facility]) Status post radiation.  02/19/22 MRI showed decreased brain lesion size and edema.  Continue surveillance every 3 months- MRI brain    Hypothyroidism due to medication Synthroid 100 mcg daily   Hypocalcemia Recommend patient to take Calcium 1227m daily.   PVD (peripheral vascular disease) (HCC) Continue Plavix and follow up with vascular surgery. Dr.Schnier recently stopped Eliquis      Orders Placed This Encounter  Procedures   CBC with Differential    Standing Status:   Future    Standing Expiration Date:   06/21/2023   Comprehensive metabolic panel    Standing Status:   Future    Standing Expiration Date:   06/21/2023   TSH    Standing Status:   Future    Standing Expiration Date:   06/21/2023   CBC with Differential    Standing Status:   Future    Standing Expiration Date:   07/05/2023   Comprehensive metabolic panel    Standing Status:   Future    Standing Expiration Date:   07/05/2023   TSH    Standing Status:   Future    Standing Expiration Date:   07/05/2023   CBC with Differential    Standing Status:   Future    Standing Expiration Date:   07/19/2023   Comprehensive metabolic panel    Standing Status:   Future    Standing Expiration Date:   07/19/2023   TSH    Standing Status:   Future    Standing Expiration Date:   07/19/2023    Follow up lab  MD 2 weeks Durvalumab   All questions were answered. The patient knows to call the clinic with any problems, questions or concerns.  [name] MD, PhD [facility] Hematology Oncology 05/09/2022      CHIEF COMPLAINTS/REASON FOR VISIT:  Follow up for lung cancer  HISTORY OF PRESENTING ILLNESS:  Joshua LIPSETTis a 66y.o. male who has above history reviewed by me today presents for follow up visit for Stage IV lung squamous cell carcinoma. Oncology History  [place]  09/18/2020 Imaging   PET scan showed right upper lobe nodule maximal SUV 11.5.  Hypermetabolic cluster right paratracheal adenopathy with maximum SUV up to 12.  No findings of metastatic disease to the neck/abdomen/pelvis or skeleton.   09/20/2020 Initial Diagnosis   Squamous cell carcinoma of right lung (HCC) NGS showed PD-L1 TPS 95%, LRP1B S9945f PIK3CA E545K, RB1 c1422-2A>T, RIT1 T83_ A84del, STK11 [id number] R248W, TMB 19.5mu46mb, MS stable.    09/20/2020 Cancer Staging   Staging form: Lung, AJCC 8th Edition - Clinical stage from 09/20/2020: Stage IV (cT1b, cN2, cM1) - Signed by [name], [name] on 12/13/2021   10/03/2020 Imaging   MRI brain with and without contrast showed no evidence of intracranial metastatic disease. Well-defined round lesion within the left parotid tail measuring approximately 2.3 x 1.8 x 1.8  cm   10/09/2020 - 11/27/2020 Radiation Therapy   concurrent chemoradiation   10/10/2020 - 10/31/2020 Chemotherapy   10/10/2020 concurrent chemoradiation.  carboplatin AUC of 2 and Taxol 45 mg/m2 x 4.  Additional chemotherapy was held due to thrombocytopenia, neutropenia       12/31/2020 - 03/21/2022 Chemotherapy   Maintenance  Durvalumab q14d      12/31/2020 -  Chemotherapy   Patient is on Treatment Plan : LUNG Durvalumab (10) q14d     03/28/2021 Imaging   CT without contrast showed new linear right perihilar consolidation with associated traction bronchiectasis.  Likely  postradiation changes.  Additional new peribronchial vascular/nodular groundglass opacities with areas of consolidation or seen primarily in the right upper and lower lobes, more distant from expected radiation fields. 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  Per my discussion with pulmonology Dr. 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     05/09/2021 Imaging   Repeat CT chest without contrast showed evolving radiation changes in the right perihilar region likely accounting for interval increase to thickening of the posterior wall of the right upper lobe bronchus.  The more peripheral right lung groundglass opacities seen on most recent study have generally improved with exception of one peripherally right upper lobe which appears new.  These are likely inflammatory.  No findings highly suspicious for local recurrence.   05/30/2021 - 01/10/2022 Chemotherapy   Durvalumab q14d     10/07/2021 Imaging   MRI brain with and without contrast showed 2 contrast-enhancing lesions, most consistent with metastatic disease.  Largest lesion is in the left frontal lobe and measures up to 1.3 cm with a large amount of surrounding edema and 9 mm rightward midline shift.  The other enhancing lesion was 4 mm in the right occipital lobe with a small amount of surrounding edema.  No acute or chronic hemorrhage.   10/07/2021 Progression   Headache after colonoscopy, progressive difficulty using her right arm.  Progressively worsening of speech. Stage IV lung cancer with brain metastasis.  started on Dexamethasone.  He was seen by neurosurgery and radiation oncology.  Recommendation is to proceed with SRS.  Patient was discharged with dexamethasone   10/16/2021 Imaging   PET scan showed postradiation changes in the right supra hilar lung without evidence of local recurrence.  No evidence of  lung cancer recurrence or metastatic disease.  Single focus of intense metabolic activity associated with posterior left 11th rib likely secondary to recent fall/posttraumatic metabolic changes.  Frontal  uptake in the left frontal lobe corresponding to the known metastatic lesion in the brain   10/29/2021 -  Radiation Therapy   Brain Radiation [facility]   12/10/2021 Imaging   Brain MRI: Continued interval decrease in size of a left frontal lobe metastasis, now measuring 10 mm. Moderate surrounding edema has also decreased.  3-4 mm metastasis within the right occipital lobe, not significantly changed in size. There is now a small focus of central non enhancement within this lesion. Increased edema. 3 mm enhancing metastasis within the high left parietal lobe. In retrospect, this was likely present as a subtle punctate focus of enhancement on the prior brain MRI of 10/22/2021.   01/18/2022 Imaging   CT chest abdomen pelvis w contrast 1. Stable post treatment changes in the right upper lobe medially with dense radiation fibrosis but no findings to suggest local recurrent tumor. 2. Significant progression of  ill-defined ground-glass opacity, interstitial thickening and airspace nodularity in the right upper lobe and upper aspect of the right lower lobe. Findings could reflect an inflammatory or atypical infectious process, drug induced pneumonitis or interstitial spread of tumor.3. No mediastinal or hilar mass or adenopathy. 4. No findings for abdominal/pelvic metastatic disease or osseous metastatic disease.   02/19/2022 Imaging   MRI brain w wo contrast showed Three metastatic deposits in the brain appear smaller. Decreased edema in the left parietal lobe. No new lesions    04/04/2022 Imaging   CT chest w contrast 1. Stable radiation changes in the right perihilar region without evidence of local recurrence. 2. Interval improvement in the patchy ground-glass opacities previously demonstrated in both lungs,  most consistent with a resolving infectious or inflammatory process (including immunotherapy-related pneumonitis). 3. No evidence of metastatic disease.    Today he feels well.Intermittent headache, is better. denies any focal deficits. Stable weight. No focal deficit chronic SOB, no chest pain wheezing, cough.   Review of Systems  Constitutional:  Positive for fatigue. Negative for appetite change, chills, fever and unexpected weight change.  HENT:   Negative for hearing loss and voice change.   Eyes:  Negative for eye problems and icterus.  Respiratory:  Negative for chest tightness, cough and shortness of breath.   Cardiovascular:  Negative for chest pain and leg swelling.  Gastrointestinal:  Negative for abdominal distention and abdominal pain.  Endocrine: Negative for hot flashes.  Genitourinary:  Negative for difficulty urinating, dysuria and frequency.   Musculoskeletal:  Negative for arthralgias.  Skin:  Negative for itching and rash.  Neurological:  Positive for headaches. Negative for light-headedness and numbness.  Hematological:  Negative for adenopathy. Does not bruise/bleed easily.  Psychiatric/Behavioral:  Negative for confusion.       MEDICAL HISTORY:  Past Medical History:  Diagnosis Date   Allergy    Cancer ([place])    lung   COPD (chronic obstructive pulmonary disease) (HCC)    Dyspnea    former smoker. only doe   History of kidney stones    Hypertension    Peripheral vascular disease ([place])    Pre-diabetes    Sleep apnea     SURGICAL HISTORY: Past Surgical History:  Procedure Laterality Date   ANTERIOR CERVICAL DECOMP/DISCECTOMY FUSION N/A 03/14/2020   Procedure: ANTERIOR CERVICAL DECOMPRESSION/DISCECTOMY FUSION 3 LEVELS C4-7;  Surgeon: Meade Maw, MD;  Location: [facility] ORS;  Service: Neurosurgery;  Laterality: N/A;   BACK SURGERY  2011   CENTRAL LINE INSERTION Right 09/10/2016   Procedure: CENTRAL LINE INSERTION;  Surgeon: Katha Cabal, MD;   Location: [facility] ORS;  Service: Vascular;  Laterality: Right;   CERVICAL FUSION     C4-c7 on Mar 14, 2020   COLONOSCOPY  2013 ?   COLONOSCOPY N/A 09/09/2021   Procedure: COLONOSCOPY;  Surgeon: [name], DO;  Location: [facility] ENDOSCOPY;  Service: Gastroenterology;  Laterality: N/A;   CORONARY ANGIOPLASTY     ESOPHAGOGASTRODUODENOSCOPY N/A 09/09/2021   Procedure: ESOPHAGOGASTRODUODENOSCOPY (EGD);  Surgeon: [name], DO;  Location: [facility] ENDOSCOPY;  Service: Gastroenterology;  Laterality: N/A;   FEMORAL-POPLITEAL BYPASS GRAFT Right 09/10/2016   Procedure: BYPASS GRAFT FEMORAL-POPLITEAL ARTERY ( BELOW KNEE );  Surgeon: Katha Cabal, MD;  Location: [facility] ORS;  Service: Vascular;  Laterality: Right;   FEMORAL-TIBIAL BYPASS GRAFT Right 03/23/2020   Procedure: BYPASS GRAFT RIGHT FEMORAL- DISTAL TIBIAL ARTERY WITH DISTAL FLOW GRAFT;  Surgeon: Katha Cabal, MD;  Location: [facility] ORS;  Service: Vascular;  Laterality: Right;   LEFT HEART CATH AND CORONARY ANGIOGRAPHY Left 05/16/2021   Procedure: LEFT HEART CATH AND CORONARY ANGIOGRAPHY;  Surgeon: Andrez Grime, MD;  Location: [place] CV LAB;  Service: Cardiovascular;  Laterality: Left;   LOWER EXTREMITY ANGIOGRAPHY Right 11/18/2016   Procedure: Lower Extremity Angiography;  Surgeon: Katha Cabal, MD;  Location: [place] CV LAB;  Service: Cardiovascular;  Laterality: Right;   LOWER EXTREMITY ANGIOGRAPHY Right 12/09/2016   Procedure: Lower Extremity Angiography;  Surgeon: Katha Cabal, MD;  Location: [place] CV LAB;  Service: Cardiovascular;  Laterality: Right;   LOWER EXTREMITY ANGIOGRAPHY Right 11/15/2019   Procedure: LOWER EXTREMITY ANGIOGRAPHY;  Surgeon: Katha Cabal, MD;  Location: [place] CV LAB;  Service: Cardiovascular;  Laterality: Right;   LOWER EXTREMITY ANGIOGRAPHY Right 12/01/2019   Procedure: Lower Extremity Angiography;  Surgeon: Algernon Huxley, MD;  Location: [place] CV  LAB;  Service: Cardiovascular;  Laterality: Right;   LOWER EXTREMITY ANGIOGRAPHY Right 12/02/2019   Procedure: LOWER EXTREMITY ANGIOGRAPHY;  Surgeon: Katha Cabal, MD;  Location: [place] CV LAB;  Service: Cardiovascular;  Laterality: Right;   LOWER EXTREMITY ANGIOGRAPHY Right 03/23/2020   Procedure: Lower Extremity Angiography;  Surgeon: Katha Cabal, MD;  Location: [place] CV LAB;  Service: Cardiovascular;  Laterality: Right;   LOWER EXTREMITY ANGIOGRAPHY Right 09/25/2021   Procedure: Lower Extremity Angiography;  Surgeon: Katha Cabal, MD;  Location: [place] CV LAB;  Service: Cardiovascular;  Laterality: Right;   LOWER EXTREMITY ANGIOGRAPHY Right 09/26/2021   Procedure: Lower Extremity Angiography;  Surgeon: Algernon Huxley, MD;  Location: [place] CV LAB;  Service: Cardiovascular;  Laterality: Right;   LOWER EXTREMITY ANGIOGRAPHY Right 04/08/2022   Procedure: Lower Extremity Angiography;  Surgeon: Katha Cabal, MD;  Location: [place] CV LAB;  Service: Cardiovascular;  Laterality: Right;   LOWER EXTREMITY INTERVENTION  11/18/2016   Procedure: Lower Extremity Intervention;  Surgeon: Katha Cabal, MD;  Location: [place] CV LAB;  Service: Cardiovascular;;   PERIPHERAL VASCULAR CATHETERIZATION Right 01/02/2015   Procedure: Lower Extremity Angiography;  Surgeon: Katha Cabal, MD;  Location: [place] CV LAB;  Service: Cardiovascular;  Laterality: Right;   PERIPHERAL VASCULAR CATHETERIZATION Right 06/18/2015   Procedure: Lower Extremity Angiography;  Surgeon: Algernon Huxley, MD;  Location: [place] CV LAB;  Service: Cardiovascular;  Laterality: Right;   PERIPHERAL VASCULAR CATHETERIZATION  06/18/2015   Procedure: Lower Extremity Intervention;  Surgeon: Algernon Huxley, MD;  Location: [place] CV LAB;  Service: Cardiovascular;;   PERIPHERAL VASCULAR CATHETERIZATION N/A 10/09/2015   Procedure: Abdominal Aortogram w/Lower Extremity;  Surgeon:  Katha Cabal, MD;  Location: [place] CV LAB;  Service: Cardiovascular;  Laterality: N/A;   PERIPHERAL VASCULAR CATHETERIZATION  10/09/2015   Procedure: Lower Extremity Intervention;  Surgeon: Katha Cabal, MD;  Location: [place] CV LAB;  Service: Cardiovascular;;   PERIPHERAL VASCULAR CATHETERIZATION Right 10/10/2015   Procedure: Lower Extremity Angiography;  Surgeon: Algernon Huxley, MD;  Location: [place] CV LAB;  Service: Cardiovascular;  Laterality: Right;   PERIPHERAL VASCULAR CATHETERIZATION Right 10/10/2015   Procedure: Lower Extremity Intervention;  Surgeon: Algernon Huxley, MD;  Location: [place] CV LAB;  Service: Cardiovascular;  Laterality: Right;   PERIPHERAL VASCULAR CATHETERIZATION Right 12/03/2015   Procedure: Lower Extremity Angiography;  Surgeon: Algernon Huxley, MD;  Location: [place] CV LAB;  Service: Cardiovascular;  Laterality: Right;   PERIPHERAL VASCULAR CATHETERIZATION Right 12/04/2015  Procedure: Lower Extremity Angiography;  Surgeon: Algernon Huxley, MD;  Location: [place] CV LAB;  Service: Cardiovascular;  Laterality: Right;   PERIPHERAL VASCULAR CATHETERIZATION  12/04/2015   Procedure: Lower Extremity Intervention;  Surgeon: Algernon Huxley, MD;  Location: [place] CV LAB;  Service: Cardiovascular;;   PERIPHERAL VASCULAR CATHETERIZATION Right 06/30/2016   Procedure: Lower Extremity Angiography;  Surgeon: Algernon Huxley, MD;  Location: [place] CV LAB;  Service: Cardiovascular;  Laterality: Right;   PERIPHERAL VASCULAR CATHETERIZATION Right 06/25/2016   Procedure: Lower Extremity Angiography;  Surgeon: Katha Cabal, MD;  Location: [place] CV LAB;  Service: Cardiovascular;  Laterality: Right;   PERIPHERAL VASCULAR CATHETERIZATION Right 07/01/2016   Procedure: Lower Extremity Angiography;  Surgeon: Katha Cabal, MD;  Location: [place] CV LAB;  Service: Cardiovascular;  Laterality: Right;   PERIPHERAL VASCULAR CATHETERIZATION   07/01/2016   Procedure: Lower Extremity Intervention;  Surgeon: Katha Cabal, MD;  Location: [place] CV LAB;  Service: Cardiovascular;;   PERIPHERAL VASCULAR CATHETERIZATION Right 08/01/2016   Procedure: Lower Extremity Angiography;  Surgeon: Katha Cabal, MD;  Location: [place] CV LAB;  Service: Cardiovascular;  Laterality: Right;   PORTA CATH INSERTION N/A 10/05/2020   Procedure: PORTA CATH INSERTION;  Surgeon: Katha Cabal, MD;  Location: [place] CV LAB;  Service: Cardiovascular;  Laterality: N/A;   stent placement in right leg Right    VIDEO BRONCHOSCOPY N/A 09/14/2020   Procedure: VIDEO BRONCHOSCOPY WITH FLUORO;  Surgeon: Tyler Pita, MD;  Location: [facility] ORS;  Service: Cardiopulmonary;  Laterality: N/A;   VIDEO BRONCHOSCOPY WITH ENDOBRONCHIAL ULTRASOUND N/A 09/14/2020   Procedure: VIDEO BRONCHOSCOPY WITH ENDOBRONCHIAL ULTRASOUND;  Surgeon: Tyler Pita, MD;  Location: [facility] ORS;  Service: Cardiopulmonary;  Laterality: N/A;    SOCIAL HISTORY: Social History   Socioeconomic History   Marital status: Single    Spouse name: Not on file   Number of children: Not on file   Years of education: Not on file   Highest education level: Not on file  Occupational History   Occupation: unemployed  Tobacco Use   Smoking status: Former    Packs/day: 2.00    Years: 38.00    Total pack years: 76.00    Types: Cigarettes    Quit date: 12/20/2014    Years since quitting: 7.3   Smokeless tobacco: Never   Tobacco comments:    quit smoking in 2017 most smoked 2   Vaping Use   Vaping Use: Former  Substance and Sexual Activity   Alcohol use: Not Currently   Drug use: No   Sexual activity: Yes  Other Topics Concern   Not on file  Social History Narrative   Not on file   Social Determinants of Health   Financial Resource Strain: Not on file  Food Insecurity: Not on file  Transportation Needs: Not on file  Physical Activity: Not on file  Stress:  Not on file  Social Connections: Not on file  Intimate Partner Violence: Not on file    FAMILY HISTORY: Family History  Problem Relation Age of Onset   Diabetes Mother    Hypertension Mother    Heart murmur Mother    Leukemia Mother    Throat cancer Maternal Grandmother    Colon cancer Neg Hx    Breast cancer Neg Hx     ALLERGIES:  has No Known Allergies.  MEDICATIONS:  Current Outpatient Medications  Medication Sig Dispense Refill   acetaminophen (TYLENOL)  325 MG tablet Take 650 mg by mouth every 6 (six) hours as needed for moderate pain.     aspirin EC 81 MG tablet Take 81 mg by mouth daily. Swallow whole.     clopidogrel (PLAVIX) 75 MG tablet TAKE 1 TABLET BY MOUTH  DAILY 100 tablet 2   Glycopyrrolate-Formoterol (BEVESPI AEROSPHERE) 9-4.8 MCG/ACT AERO Inhale 2 puffs into the lungs 2 (two) times daily. 32.1 g 1   levETIRAcetam (KEPPRA) 500 MG tablet Take 1 tablet (500 mg total) by mouth 2 (two) times daily. 180 tablet 3   levothyroxine (SYNTHROID) 100 MCG tablet Take 1 tablet (100 mcg total) by mouth daily before breakfast. 30 tablet 0   simvastatin (ZOCOR) 40 MG tablet Take 40 mg by mouth at bedtime.     No current facility-administered medications for this visit.     PHYSICAL EXAMINATION: ECOG PERFORMANCE STATUS: 1 - Symptomatic but completely ambulatory Vitals:   05/09/22 0821  BP: 122/81  Pulse: 100  Resp: 19  Temp: (!) 96.7 F (35.9 C)  SpO2: 99%   Filed Weights   05/09/22 0821  Weight: 218 lb 12.8 oz (99.2 kg)    Physical Exam Constitutional:      General: He is not in acute distress.    Appearance: He is not diaphoretic.     Comments: Patient walks with a cane  HENT:     Head: Normocephalic and atraumatic.     Nose: Nose normal.     Mouth/Throat:     Pharynx: No oropharyngeal exudate.  Eyes:     General: No scleral icterus.    Pupils: Pupils are equal, round, and reactive to light.  Cardiovascular:     Rate and Rhythm: Normal rate and regular  rhythm.     Heart sounds: No murmur heard. Pulmonary:     Effort: Pulmonary effort is normal. No respiratory distress.     Breath sounds: No rales.  Chest:     Chest wall: No tenderness.  Abdominal:     General: There is no distension.     Palpations: Abdomen is soft.     Tenderness: There is no abdominal tenderness.  Musculoskeletal:        General: Normal range of motion.     Cervical back: Normal range of motion and neck supple.  Skin:    General: Skin is warm and dry.     Findings: No erythema.  Neurological:     Mental Status: He is alert and oriented to person, place, and time.     Cranial Nerves: No cranial nerve deficit.     Motor: No abnormal muscle tone.     Coordination: Coordination normal.  Psychiatric:        Mood and Affect: Affect normal.      LABORATORY DATA:  I have reviewed the data as listed    Latest Ref Rng & Units 05/09/2022    7:57 AM 04/25/2022    9:01 AM 04/07/2022    1:45 PM  CBC  WBC 4.0 - 10.5 K/uL 4.3  5.1  4.4   Hemoglobin 13.0 - 17.0 g/dL 12.4  12.0  13.2   Hematocrit 39.0 - 52.0 % 38.6  37.3  41.6   Platelets 150 - 400 K/uL 209  212  212       Latest Ref Rng & Units 05/09/2022    7:57 AM 04/25/2022    9:01 AM 04/08/2022    9:25 AM  CMP  Glucose 70 - 99 mg/dL 163  128    BUN 8 - 23 mg/dL _0 Creatinine 0.61 - 1.24 mg/dL 0.96  1.22  0.90   Sodium 135 - 145 mmol/L 141  143    Potassium 3.5 - 5.1 mmol/L 3.4  3.4    Chloride 98 - 111 mmol/L 110  109    CO2 22 - 32 mmol/L 25  28    Calcium 8.9 - 10.3 mg/dL 8.8  9.2    Total Protein 6.5 - 8.1 g/dL 6.9  6.7    Total Bilirubin 0.3 - 1.2 mg/dL 0.3  0.8    Alkaline Phos 38 - 126 U/L 66  74    AST 15 - 41 U/L 29  29    ALT 0 - 44 U/L 24  24       RADIOGRAPHIC STUDIES: I have personally reviewed the radiological images as listed and agreed with the findings in the report. PERIPHERAL VASCULAR CATHETERIZATION  Result Date: 04/08/2022 See surgical note for result.  CT Chest W  Contrast  Result Date: 04/06/2022 CLINICAL DATA:  Restaging lung cancer. 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 Chemotherapy, radiation and immunotherapy completed. * Tracking Code: BO * EXAM: CT CHEST WITH CONTRAST TECHNIQUE: Multidetector CT imaging of the chest was performed during intravenous contrast administration. RADIATION DOSE REDUCTION: This exam was performed according to the departmental dose-optimization program which includes automated exposure control, adjustment of the mA and/or kV according to patient size and/or use of iterative reconstruction technique. CONTRAST:  66m OMNIPAQUE IOHEXOL 300 MG/ML  SOLN COMPARISON:  Multiple priors, including CT 01/17/2022 and PET-CT 10/16/2021. FINDINGS: Cardiovascular: Right IJ Port-A-Cath extends to the superior cavoatrial junction. No acute vascular findings. Minimal aortic and coronary artery atherosclerosis. The heart size is normal. There is no pericardial effusion. Mediastinum/Nodes: There are no enlarged mediastinal, hilar or axillary lymph nodes.Stable right perihilar soft tissue thickening. No definite thyroid tissue identified. The esophagus and trachea appear unremarkable. Lungs/Pleura: Trace right pleural effusion. No pneumothorax. There are stable radiation changes in the right perihilar region with architectural distortion and traction bronchiectasis. No recurrent mass lesion identified. The patchy ground-glass opacity seen on the most recent examination have improved. No suspicious pulmonary nodules. Upper abdomen: Mild hepatic steatosis. The visualized upper abdomen otherwise appears unremarkable. Musculoskeletal/Chest wall: There is no chest wall mass or suspicious osseous finding. Old left-sided rib fractures. IMPRESSION: 1. Stable radiation changes in the right perihilar region without evidence of local recurrence. 2. Interval improvement in the patchy ground-glass opacities previously demonstrated in both  lungs, most consistent with a resolving infectious or inflammatory process (including immunotherapy-related pneumonitis). 3. No evidence of metastatic disease. Electronically Signed   By: WRichardean SaleM.D.   On: 04/06/2022 11:53   VAS [place] EXTREMITY ARTERIAL DUPLEX  Result Date: 03/10/2022 LOWER EXTREMITY ARTERIAL DUPLEX STUDY Patient Name:  Joshua [name] Date of Exam:   03/10/2022 Medical Rec #: [id number]           Accession #:    [id number] of Birth: [birth year]          Patient Gender: M Patient Age:   89years Exam Location:  [facility] Procedure:      VAS [place] EXTREMITY ARTERIAL DUPLEX Referring Phys: GHortencia Pilar--------------------------------------------------------------------------------  Indications: Claudication, peripheral artery disease, and pain post workout. High Risk Factors: Past history of smoking. Other Factors: ASO s/p multiple interventions and bypass.  11/15/2019 Right thrombectomy of Fempop insitu graft with added                stent throughout.                 12/01/2019: Aortogram and Selective Right Lower Extremity                Angiogram. 6 mg of TPA delivered in a lysis catheter from the                Origin of the Right Femoral -Popliteal bypass down the proximal                ATA with placement of the lysis.                 12/02/2019: PTA of the Right Posterior Tibial Artery. PTA of the                Right Peroneal Artery. Mechanical Thrombectomy of the Right                Femoral -Popliteal Bypass graft. Mechanical Thrombectomy of the                Right Tibioperoneal Trunk and Posterior Tibial Artery.                 09/25/2021: Right Lower Extremity Angiography third order                catheter placement. PTA of the proximal peroneal and                tibioperoneal trunk to 2.5 mm with an ultra score balloon. PTA of                the Femoral Artery and the proximal anastomosis of the bypass                graft to  4 mm with an ultra score balloon. Administration of 5 mg                of tpa throughout the entire bypass and in the peroneal Artery.                Initiation of continuous tpa infusion Right lower Extremity for                limb salvage. Insertion of a triple lumen catheter Left CFV with                [place] and fluoroscopic guidance.                 09/26/2021: Right LE Angiogram. PTA of the Right Posterior Tibial                Artery with 2.5 mm diameter by 30 cm length angioplasty balloon.                PTA of the distal bypass anastomosis and distal bypass graft                analogous to the Popliteal artery with 4 mm diameter by 8 cm                length Lutonix drug coated angioplasty balloon.  Vascular Interventions: Hx of multiple right lower extremity PTA's/stents.                         09/10/2016 Right  femoral to below the knee popliteal                         artery bypass graft placement. 5/01 Right bypass graft &                         mid popliteal artery PTA & coil embolectomy of large                         branch of bypass graft. 12/09/16 Right distal bypass                         graft PTA/stent and right posterior tibial artery PTA.                         03/23/2020 Rt PTA thrombectomy. Rt Profunda fem artery to                         PTA bypass. Current ABI:            Right: 0.28, Left: 1.21 Performing Technologist: Delorise Shiner RVT  Examination Guidelines: A complete evaluation includes B-mode imaging, spectral Doppler, color Doppler, and power Doppler as needed of all accessible portions of each vessel. Bilateral testing is considered an integral part of a complete examination. Limited examinations for reoccurring indications may be performed as noted.  +----------+--------+-----+--------+----------+--------+ RIGHT     PSV cm/sRatioStenosisWaveform  Comments +----------+--------+-----+--------+----------+--------+ CFA Distal76                   triphasic           +----------+--------+-----+--------+----------+--------+ DFA       47                   biphasic           +----------+--------+-----+--------+----------+--------+ SFA Prox  0                                       +----------+--------+-----+--------+----------+--------+ SFA Mid   0                                       +----------+--------+-----+--------+----------+--------+ SFA Distal0                                       +----------+--------+-----+--------+----------+--------+ POP Prox  35                   monophasic         +----------+--------+-----+--------+----------+--------+ PTA Distal12                   monophasic         +----------+--------+-----+--------+----------+--------+  Right Graft #1: Fem- pop +------------------+--------+--------+----------+--------+                   PSV cm/sStenosisWaveform  Comments +------------------+--------+--------+----------+--------+ Inflow            76              monophasic         +------------------+--------+--------+----------+--------+  Prox Anastomosis  0                                  +------------------+--------+--------+----------+--------+ Proximal Graft    0                                  +------------------+--------+--------+----------+--------+ Mid Graft         0                                  +------------------+--------+--------+----------+--------+ Distal Graft      0                                  +------------------+--------+--------+----------+--------+ Distal Anastomosis0                                  +------------------+--------+--------+----------+--------+ Outflow           35              monophasic         +------------------+--------+--------+----------+--------+   Summary: Right: Total occlusion noted in the superficial femoral artery. Occluded right fem-pop bypass graft.  See table(s) above for measurements and observations.  Electronically signed by [name] MD on 03/10/2022 at 36:05:38 PM.    Final    VAS [place] ABI WITH/WO TBI  Result Date: 03/10/2022  LOWER EXTREMITY DOPPLER STUDY Patient Name:  Joshua [name]  Date of Exam:   03/10/2022 Medical Rec #: [id number]            Accession #:    [id number] Date of Birth: [birth year]           Patient Gender: M Patient Age:   66 years Exam Location:  [facility] Procedure:      VAS [place] ABI WITH/WO TBI Referring Phys: [facility] --------------------------------------------------------------------------------  Indications: Claudication, peripheral artery disease, and Routine f/u. High Risk Factors: Hyperlipidemia, past history of smoking. Other Factors: ASO s/p multiple interventions and bypass.                11/15/2019 Right thrombectomy of Fempop insitu graft with added                stent throughout.                 12/01/2019: Aortogram and Selective Right Lower Extremity                Angiogram. 6 mg of TPA delivered in a lysis catheter from the                Origin of the Right Femoral -Popliteal bypass down the proximal                ATA with placement of the lysis.                 12/02/2019: PTA of the Right Posterior Tibial Artery. PTA of the                Right Peroneal Artery. Mechanical Thrombectomy of the Right  Femoral -Popliteal Bypass graft. Mechanical Thrombectomy of the                Right Tibioperoneal Trunk and Posterior Tibial Artery.                 09/25/2021: Right Lower Extremity Angiography third order                catheter placement. PTA of the proximal peroneal and                tibioperoneal trunk to 2.5 mm with an ultra score balloon. PTA of                the Femoral Artery and the proximal anastomosis of the bypass                graft to 4 mm with an ultra score balloon. Administration of 5 mg                of tpa throughout the entire bypass and in the peroneal Artery.                Initiation of continuous  tpa infusion Right lower Extremity for                limb salvage. Insertion of a triple lumen catheter Left CFV with                [place] and fluoroscopic guidance.                 09/26/2021: Right LE Angiogram. PTA of the Right Posterior Tibial                Artery with 2.5 mm diameter by 30 cm length angioplasty balloon.                PTA of the distal bypass anastomosis and distal bypass graft                analogous to the Popliteal artery with 4 mm diameter by 8 cm                length Lutonix drug coated angioplasty balloon.  Vascular Interventions: Hx of multiple right lower extremity PTA's/stents.                         09/10/2016 Right femoral to below the knee popliteal                         artery bypass graft placement. 5/01 Right bypass graft &                         mid popliteal artery PTA & coil embolectomy of large                         branch of bypass graft. 12/09/16 Right distal bypass                         graft PTA/stent and right posterior tibial artery PTA.                         03/23/2020 Rt PTA thrombectomy. Rt Profunda fem artery to  PTA bypass. Performing Technologist: Delorise Shiner RVT  Examination Guidelines: A complete evaluation includes at minimum, Doppler waveform signals and systolic blood pressure reading at the level of bilateral brachial, anterior tibial, and posterior tibial arteries, when vessel segments are accessible. Bilateral testing is considered an integral part of a complete examination. Photoelectric Plethysmograph (PPG) waveforms and toe systolic pressure readings are included as required and additional duplex testing as needed. Limited examinations for reoccurring indications may be performed as noted.  ABI Findings: +---------+------------------+-----+----------+--------+ Right    Rt Pressure (mmHg)IndexWaveform  Comment  +---------+------------------+-----+----------+--------+ Brachial 147                                        +---------+------------------+-----+----------+--------+ ATA      33                0.22 monophasic         +---------+------------------+-----+----------+--------+ PTA      42                0.28 monophasic         +---------+------------------+-----+----------+--------+ Great Toe0                 0.00                    +---------+------------------+-----+----------+--------+ +---------+------------------+-----+---------+-------+ Left     Lt Pressure (mmHg)IndexWaveform Comment +---------+------------------+-----+---------+-------+ Brachial 152                                     +---------+------------------+-----+---------+-------+ ATA      184               1.21 triphasic        +---------+------------------+-----+---------+-------+ PTA      180               1.18 triphasic        +---------+------------------+-----+---------+-------+ Great Toe146               0.96                  +---------+------------------+-----+---------+-------+ +-------+-----------+-----------+------------+------------+ ABI/TBIToday's ABIToday's TBIPrevious ABIPrevious TBI +-------+-----------+-----------+------------+------------+ Right  0.28       0.00       1.06        0.97         +-------+-----------+-----------+------------+------------+ Left   1.21       0.96       1.33        1.25         +-------+-----------+-----------+------------+------------+ Right ABIs appear decreased compared to prior study on 10/23/2021. Left ABIs appear essentially unchanged compared to prior study on 10/23/2021.  Summary: Right: Resting right ankle-brachial index indicates critical limb ischemia. The right toe-brachial index is abnormal. Left: Resting left ankle-brachial index is within normal range. The left toe-brachial index is normal. *See table(s) above for measurements and observations.  Electronically signed by [name] MD on 03/10/2022 at 6:05:29 PM.    Final     MR Brain W Wo Contrast  Result Date: 02/19/2022 CLINICAL DATA:  Lung cancer with metastatic disease. Assess response to treatment. EXAM: MRI HEAD WITHOUT AND WITH CONTRAST TECHNIQUE: Multiplanar, multiecho pulse sequences of the brain and surrounding structures were obtained without and with intravenous contrast. CONTRAST:  53m GADAVIST GADOBUTROL 1 MMOL/ML IV  SOLN COMPARISON:  MRI head 12/10/2021 FINDINGS: Brain: 6 mm enhancing lesion left frontal parietal lobe has improved. Mild amount of surrounding edema has improved. Subtle enhancing lesion in the high left parietal lobe, 18/157 appears smaller and shows minimal enhancement. Enhancing lesion right occipital lobe measures approximately 3 mm and is smaller. No new lesions identified. No acute infarct. Patchy white matter hyperintensities bilaterally consistent with chronic microvascular ischemia. Negative for hemorrhage or hydrocephalus. Vascular: Normal arterial flow voids at the skull base. Skull and upper cervical spine: Negative Sinuses/Orbits: Mild mucosal edema paranasal sinuses. 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 Decreased edema in the left parietal lobe. No new lesions. Electronically Signed   By: Franchot Gallo M.D.   On: 02/19/2022 18:09   DG Chest 2 View  Result Date: 02/11/2022 CLINICAL DATA:  History of pneumonitis.  Follow-up. EXAM: CHEST - 2 VIEW COMPARISON:  CT C AP January 17, 2022; chest radiograph January 14, 2021 FINDINGS: Right anterior chest wall Port-A-Cath is present with tip projecting over the superior vena cava. Redemonstrated right suprahilar masslike opacity. Scattered heterogeneous opacities right mid and lower lung. No pleural effusion or pneumothorax. Thoracic spine degenerative changes. IMPRESSION: Redemonstrated right mid and lower lung airspace opacities which may represent infectious process in the appropriate clinical setting. Alternative etiologies such as  interstitial spread of tumor not excluded. Consider short-term follow-up chest CT. Electronically Signed   By: Lovey Newcomer M.D.   On: 02/11/2022 11:00

## 2022-05-09 NOTE — Assessment & Plan Note (Signed)
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  Repeat CT scan showed improvement of patchy ground-glass opacities.  Reviewed and discussed with patient Labs reviewed and discussed with patient. Proceed with Durvalumab

## 2022-05-14 ENCOUNTER — Other Ambulatory Visit: Payer: Self-pay

## 2022-05-14 ENCOUNTER — Emergency Department
Admission: EM | Admit: 2022-05-14 | Discharge: 2022-05-14 | Disposition: A | Discharge status: Home / Self Care | Payer: Medicare Other | Attending: Emergency Medicine | Admitting: Emergency Medicine

## 2022-05-14 ENCOUNTER — Emergency Department: Payer: Medicare Other

## 2022-05-14 DIAGNOSIS — S42412A Displaced simple supracondylar fracture without intercondylar fracture of left humerus, initial encounter for closed fracture: Secondary | ICD-10-CM | POA: Insufficient documentation

## 2022-05-14 DIAGNOSIS — S4992XA Unspecified injury of left shoulder and upper arm, initial encounter: Secondary | ICD-10-CM | POA: Diagnosis present

## 2022-05-14 DIAGNOSIS — W108XXA Fall (on) (from) other stairs and steps, initial encounter: Secondary | ICD-10-CM | POA: Diagnosis not present

## 2022-05-14 DIAGNOSIS — J449 Chronic obstructive pulmonary disease, unspecified: Secondary | ICD-10-CM | POA: Diagnosis not present

## 2022-05-14 MED ORDER — OXYCODONE-ACETAMINOPHEN 5-325 MG PO TABS
1.0000 | ORAL_TABLET | Freq: Once | ORAL | Status: AC
  Administered 2022-05-14: 1 via ORAL
  Filled 2022-05-14: qty 1

## 2022-05-14 MED ORDER — OXYCODONE-ACETAMINOPHEN 5-325 MG PO TABS: 1.0000 | ORAL_TABLET | Freq: Four times a day (QID) | ORAL | 0 refills | Status: AC | PRN

## 2022-05-14 NOTE — ED Triage Notes (Signed)
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  C/O left elbow pain

## 2022-05-14 NOTE — Discharge Instructions (Addendum)
-  Please keep your arm in the splint until you can be evaluated by orthopedics.  Please call them today or tomorrow morning to schedule an appointment.  Let them know that you were seen here in the emergency department.  -You may take oxycodone/acetaminophen as needed for pain.  Be careful as it may be addicting and may also cause you to become dizzy/drowsy.  -Return to the emergency department at any time if you begin to experience any new or worsening symptoms.

## 2022-05-14 NOTE — ED Provider Notes (Signed)
[facility] Provider Note    Event Date/Time   First MD Initiated Contact with Patient 05/14/22 1430     (approximate)   History   Chief Complaint Fall   HPI Joshua [name] is a 66 y.o. male, history of PVD, iron deficiency anemia, malignant neoplasm to the brain, COPD, GERD, peripheral neuropathy, presents emergency department for evaluation of left elbow pain following mechanical fall.  Reports falling after tripping over a step.  Denies any head injury.  He reportedly fell with his arm at a 90 degree angle.  Sustained a small abrasion along the left elbow, endorsing moderate pain to the left elbow and is unable to fully flex or extend.  Denies any other injuries.  Denies fever/chills, chest pain, shortness of breath, headache, cold sensation to the affected extremity, numbness/tingling, rashes lesions, or nausea/vomiting.  History Limitations: No limitations.        Physical Exam  Triage Vital Signs: ED Triage Vitals  Enc Vitals Group     BP 05/14/22 1408 (!) 149/88     Pulse Rate 05/14/22 1408 (!) 107     Resp 05/14/22 1408 18     Temp 05/14/22 1408 98.4 F (36.9 C)     Temp Source 05/14/22 1408 Oral     SpO2 05/14/22 1408 99 %     Weight 05/14/22 1345 218 lb 11.1 oz (99.2 kg)     Height 05/14/22 1345 5\' 11"  (1.803 m)     Head Circumference --      Peak Flow --      Pain Score 05/14/22 1345 5     Pain Loc --      Pain Edu? --      Excl. in [place]? --     Most recent vital signs: Vitals:   05/14/22 1408 05/14/22 1612  BP: (!) 149/88 (!) 148/79  Pulse: (!) 107 100  Resp: 18 16  Temp: 98.4 F (36.9 C)   SpO2: 99% 99%    General: Awake, NAD.  Skin: Warm, dry. No rashes or lesions.  Eyes: PERRL. Conjunctivae normal.  CV: Good peripheral perfusion.  Resp: Normal effort.  Abd: Soft, non-tender. No distention.  Neuro: At baseline. No gross neurological deficits.   Focused Exam: Diffuse swelling across the left elbow.  No joint  laxity.  Unable to fully extend or flex the elbow due to pain.  Currently guarding in a 90 degree angle.  PMS intact distally.  Point tenderness along the olecranon and supracondylar region.  No obvious deformities.  No osseous tenderness along the forearm or upper arm.  Normal range of motion at the shoulder joint.  Physical Exam    ED Results / Procedures / Treatments  Labs (all labs ordered are listed, but only abnormal results are displayed) Labs Reviewed - No data to display   EKG N/A.    RADIOLOGY  ED Provider Interpretation: I personally viewed and interpreted this x-ray, supracondylar fracture of the humerus present.  Nondisplaced.  DG Elbow Complete Left  Result Date: 05/14/2022 CLINICAL DATA:  [place] today with elbow pain EXAM: LEFT ELBOW - COMPLETE 3+ VIEW COMPARISON:  None Available. FINDINGS: Supracondylar fracture of the humerus without angulation or displacement. Joint effusion is present. No fracture of the radius or ulna. IMPRESSION: Supracondylar fracture of the humerus without angulation or displacement. Electronically Signed   By: Nelson Chimes M.D.   On: 05/14/2022 14:32    PROCEDURES:  Critical Care performed: N/A.  Procedures  MEDICATIONS ORDERED IN ED: Medications  oxyCODONE-acetaminophen (PERCOCET/ROXICET) 5-325 MG per tablet 1 tablet (1 tablet Oral Given 05/14/22 1513)     IMPRESSION / MDM / ASSESSMENT AND PLAN / ED COURSE  I reviewed the triage vital signs and the nursing notes.                              Differential diagnosis includes, but is not limited to, elbow dislocation, olecranon fracture, supracondylar fracture, elbow sprain, abrasion.  Assessment/Plan Patient presents with left elbow pain following mechanical fall.  No joint laxity in the elbow, however he is unable to fully extend or flex the elbow due to severe pain.  X-ray does show supracondylar fracture without angulation or displacement.  He is neurovascularly intact.  Pain  is controlled when resting at a 90 degree angle.  Will place in a long posterior arm splint and place in a sling.  Provide him with a brief course of oxycodone/acetaminophen intake.  We will provide him with a referral over to orthopedics as well.  Will discharge.  Provided the patient with anticipatory guidance, return precautions, and educational material. Encouraged the patient to return to the emergency department at any time if they begin to experience any new or worsening symptoms. Patient expressed understanding and agreed with the plan.   Patient's presentation is most consistent with acute complicated illness / injury requiring diagnostic workup.       FINAL CLINICAL IMPRESSION(S) / ED DIAGNOSES   Final diagnoses:  Closed supracondylar fracture of left humerus, initial encounter     Rx / DC Orders   ED Discharge Orders          Ordered    oxyCODONE-acetaminophen (PERCOCET/ROXICET) 5-325 MG tablet  Every 6 hours PRN        05/14/22 1521             Note:  This document was prepared using Dragon voice recognition software and may include unintentional dictation errors.   Teodoro Spray, [place] 05/14/22 1643    Arta Silence, MD 05/20/22 1544

## 2022-05-19 ENCOUNTER — Encounter (INDEPENDENT_AMBULATORY_CARE_PROVIDER_SITE_OTHER): Payer: Self-pay

## 2022-05-21 ENCOUNTER — Other Ambulatory Visit: Payer: Self-pay | Admitting: Orthopedic Surgery

## 2022-05-21 ENCOUNTER — Ambulatory Visit
Admission: RE | Admit: 2022-05-21 | Discharge: 2022-05-21 | Disposition: A | Discharge status: Home / Self Care | Payer: Medicare Other | Source: Ambulatory Visit | Attending: Orthopedic Surgery | Admitting: Orthopedic Surgery

## 2022-05-21 DIAGNOSIS — S42412A Displaced simple supracondylar fracture without intercondylar fracture of left humerus, initial encounter for closed fracture: Secondary | ICD-10-CM

## 2022-05-23 ENCOUNTER — Telehealth: Payer: Self-pay | Admitting: Oncology

## 2022-05-23 ENCOUNTER — Encounter: Payer: Self-pay | Admitting: Oncology

## 2022-05-23 ENCOUNTER — Inpatient Hospital Stay (HOSPITAL_BASED_OUTPATIENT_CLINIC_OR_DEPARTMENT_OTHER): Payer: Medicare Other | Admitting: Oncology

## 2022-05-23 ENCOUNTER — Inpatient Hospital Stay: Payer: Medicare Other

## 2022-05-23 ENCOUNTER — Ambulatory Visit
Admission: RE | Admit: 2022-05-23 | Discharge: 2022-05-23 | Disposition: A | Discharge status: Home / Self Care | Payer: Medicare Other | Source: Ambulatory Visit | Attending: Oncology | Admitting: Oncology

## 2022-05-23 ENCOUNTER — Inpatient Hospital Stay: Payer: Medicare Other | Attending: Oncology

## 2022-05-23 VITALS — BP 121/77 | HR 74 | Temp 99.0°F | Resp 16 | Ht 71.0 in | Wt 218.0 lb

## 2022-05-23 DIAGNOSIS — C3411 Malignant neoplasm of upper lobe, right bronchus or lung: Secondary | ICD-10-CM | POA: Insufficient documentation

## 2022-05-23 DIAGNOSIS — Z5112 Encounter for antineoplastic immunotherapy: Secondary | ICD-10-CM | POA: Insufficient documentation

## 2022-05-23 DIAGNOSIS — C3491 Malignant neoplasm of unspecified part of right bronchus or lung: Secondary | ICD-10-CM | POA: Diagnosis present

## 2022-05-23 DIAGNOSIS — S42412A Displaced simple supracondylar fracture without intercondylar fracture of left humerus, initial encounter for closed fracture: Secondary | ICD-10-CM | POA: Insufficient documentation

## 2022-05-23 DIAGNOSIS — C7931 Secondary malignant neoplasm of brain: Secondary | ICD-10-CM | POA: Insufficient documentation

## 2022-05-23 DIAGNOSIS — I739 Peripheral vascular disease, unspecified: Secondary | ICD-10-CM | POA: Diagnosis not present

## 2022-05-23 DIAGNOSIS — E032 Hypothyroidism due to medicaments and other exogenous substances: Secondary | ICD-10-CM | POA: Diagnosis not present

## 2022-05-23 LAB — CBC WITH DIFFERENTIAL/PLATELET
Abs Immature Granulocytes: 0.01 10*3/uL (ref 0.00–0.07)
Basophils Absolute: 0.1 10*3/uL (ref 0.0–0.1)
Basophils Relative: 1 %
Eosinophils Absolute: 0.3 10*3/uL (ref 0.0–0.5)
Eosinophils Relative: 7 %
HCT: 35.5 % — ABNORMAL LOW (ref 39.0–52.0)
Hemoglobin: 11.3 g/dL — ABNORMAL LOW (ref 13.0–17.0)
Immature Granulocytes: 0 %
Lymphocytes Relative: 15 %
Lymphs Abs: 0.7 10*3/uL (ref 0.7–4.0)
MCH: 27.4 pg (ref 26.0–34.0)
MCHC: 31.8 g/dL (ref 30.0–36.0)
MCV: 86.2 fL (ref 80.0–100.0)
Monocytes Absolute: 0.6 10*3/uL (ref 0.1–1.0)
Monocytes Relative: 12 %
Neutro Abs: 3.2 10*3/uL (ref 1.7–7.7)
Neutrophils Relative %: 65 %
Platelets: 219 10*3/uL (ref 150–400)
RBC: 4.12 MIL/uL — ABNORMAL LOW (ref 4.22–5.81)
RDW: 13.8 % (ref 11.5–15.5)
WBC: 4.8 10*3/uL (ref 4.0–10.5)
nRBC: 0 % (ref 0.0–0.2)

## 2022-05-23 LAB — COMPREHENSIVE METABOLIC PANEL
ALT: 14 U/L (ref 0–44)
AST: 21 U/L (ref 15–41)
Albumin: 3.6 g/dL (ref 3.5–5.0)
Alkaline Phosphatase: 63 U/L (ref 38–126)
Anion gap: 9 (ref 5–15)
BUN: 16 mg/dL (ref 8–23)
CO2: 25 mmol/L (ref 22–32)
Calcium: 8.8 mg/dL — ABNORMAL LOW (ref 8.9–10.3)
Chloride: 103 mmol/L (ref 98–111)
Creatinine, Ser: 0.83 mg/dL (ref 0.61–1.24)
GFR, Estimated: >60 mL/min (ref >60)
Glucose, Bld: 113 mg/dL — ABNORMAL HIGH (ref 70–99)
Potassium: 3.5 mmol/L (ref 3.5–5.1)
Sodium: 137 mmol/L (ref 135–145)
Total Bilirubin: 0.6 mg/dL (ref 0.3–1.2)
Total Protein: 6.6 g/dL (ref 6.5–8.1)

## 2022-05-23 MED ORDER — HEPARIN SOD (PORK) LOCK FLUSH 100 UNIT/ML IV SOLN
500.0000 [IU] | Freq: Once | INTRAVENOUS | Status: AC | PRN
  Administered 2022-05-23: 500 [IU]
  Filled 2022-05-23: qty 5

## 2022-05-23 MED ORDER — HEPARIN SOD (PORK) LOCK FLUSH 100 UNIT/ML IV SOLN
INTRAVENOUS | Status: AC
  Filled 2022-05-23: qty 5

## 2022-05-23 MED ORDER — SODIUM CHLORIDE 0.9 % IV SOLN
Freq: Once | INTRAVENOUS | Status: AC
  Filled 2022-05-23: qty 250

## 2022-05-23 MED ORDER — SODIUM CHLORIDE 0.9 % IV SOLN
10.0000 mg/kg | Freq: Once | INTRAVENOUS | Status: AC
  Administered 2022-05-23: 1000 mg via INTRAVENOUS
  Filled 2022-05-23: qty 20

## 2022-05-23 MED ORDER — GADOBUTROL 1 MMOL/ML IV SOLN
10.0000 mL | Freq: Once | INTRAVENOUS | Status: AC | PRN
  Administered 2022-05-23: 10 mL via INTRAVENOUS

## 2022-05-23 NOTE — Assessment & Plan Note (Addendum)
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  Repeat CT scan showed improvement of patchy ground-glass opacities.  Reviewed and discussed with patient Labs reviewed and discussed with patient. Proceed with Durvalumab Repeat CT chest abdomen pelvis in Dec 2023

## 2022-05-23 NOTE — Assessment & Plan Note (Signed)
Status post radiation.  02/19/22 MRI showed decreased brain lesion size and edema.  Continue surveillance every 3 months- MRI brain

## 2022-05-23 NOTE — Assessment & Plan Note (Signed)
Synthroid 100 mcg daily

## 2022-05-23 NOTE — Telephone Encounter (Signed)
Called pt twice to req he call CT SCheduling

## 2022-05-23 NOTE — Progress Notes (Signed)
Hematology/Oncology Progress note [phone] [phone]    ASSESSMENT & PLAN:   Cancer Staging  Squamous cell carcinoma of right lung [facility]) Staging form: Lung, AJCC 8th Edition - Clinical stage from 09/20/2020: Stage IV (cT1b, cN2, cM1) - Signed by [name], MD on 12/13/2021   Squamous cell carcinoma of right lung ([place]) Durvalumab was previously held due to pneumonitis.  Repeat CT scan showed improvement of patchy ground-glass opacities.  Reviewed and discussed with patient Labs reviewed and discussed with patient. Proceed with Durvalumab Repeat CT chest abdomen pelvis in Dec 2023  Malignant neoplasm metastatic to brain [facility]) Status post radiation.  02/19/22 MRI showed decreased brain lesion size and edema.  Continue surveillance every 3 months- MRI brain    PVD (peripheral vascular disease) ([place]) Continue Plavix and follow up with vascular surgery. Dr.Schnier recently stopped Eliquis    Hypothyroidism due to medication Synthroid 100 mcg daily   Hypocalcemia Recommend patient to take Calcium 1246m daily.      Orders Placed This Encounter  Procedures   CT CHEST ABDOMEN PELVIS W CONTRAST    Standing Status:   Future    Standing Expiration Date:   05/24/2023    Order Specific Question:   Preferred imaging location?    Answer:   [facility]    Order Specific Question:   Is Oral Contrast requested for this exam?    Answer:   Yes, Per Radiology protocol    Follow up lab MD 2 weeks Durvalumab   All questions were answered. The patient knows to call the clinic with any problems, questions or concerns.  [name] MD, PhD [facility] Hematology Oncology 05/23/2022      CHIEF COMPLAINTS/REASON FOR VISIT:  Follow up for lung cancer  HISTORY OF PRESENTING ILLNESS:  Joshua STURDIVANTis a 66y.o. male who has above history reviewed by me today presents for follow up visit for Stage IV lung squamous cell carcinoma. Oncology History   Squamous [place] carcinoma of right lung [place]  09/18/2020 Imaging   PET scan showed right upper lobe nodule maximal SUV 11.5.  Hypermetabolic cluster right paratracheal adenopathy with maximum SUV up to 12.  No findings of metastatic disease to the neck/abdomen/pelvis or skeleton.   09/20/2020 Initial Diagnosis   Squamous cell carcinoma of right lung (HCC) NGS showed PD-L1 TPS 95%, LRP1B S9963f PIK3CA E545K, RB1 c1422-2A>T, RIT1 T83_ A84del, STK11 [id number] R248W, TMB 19.5mu8mb, MS stable.    09/20/2020 Cancer Staging   Staging form: Lung, AJCC 8th Edition - Clinical stage from 09/20/2020: Stage IV (cT1b, cN2, cM1) - Signed by Ryane Konieczny, [name] on 12/13/2021   10/03/2020 Imaging   MRI brain with and without contrast showed no evidence of intracranial metastatic disease. Well-defined round lesion within the left parotid tail measuring approximately 2.3 x 1.8 x 1.8 cm   10/09/2020 - 11/27/2020 Radiation Therapy   concurrent chemoradiation   10/10/2020 - 10/31/2020 Chemotherapy   10/10/2020 concurrent chemoradiation.  carboplatin AUC of 2 and Taxol 45 mg/m2 x 4.  Additional chemotherapy was held due to thrombocytopenia, neutropenia       12/31/2020 - 03/21/2022 Chemotherapy   Maintenance  Durvalumab q14d      12/31/2020 -  Chemotherapy   Patient is on Treatment Plan : LUNG Durvalumab (10) q14d     03/28/2021 Imaging   CT without contrast showed new linear right perihilar consolidation with associated traction bronchiectasis.  Likely postradiation changes.  Additional new peribronchial vascular/nodular groundglass opacities with areas  of consolidation or seen primarily in the right upper and lower lobes, more distant from expected radiation fields. 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  Per my discussion with pulmonology Dr. 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      05/09/2021 Imaging   Repeat CT chest without contrast showed evolving radiation changes in the right perihilar region likely accounting for interval increase to thickening of the posterior wall of the right upper lobe bronchus.  The more peripheral right lung groundglass opacities seen on most recent study have generally improved with exception of one peripherally right upper lobe which appears new.  These are likely inflammatory.  No findings highly suspicious for local recurrence.   05/30/2021 - 01/10/2022 Chemotherapy   Durvalumab q14d     10/07/2021 Imaging   MRI brain with and without contrast showed 2 contrast-enhancing lesions, most consistent with metastatic disease.  Largest lesion is in the left frontal lobe and measures up to 1.3 cm with a large amount of surrounding edema and 9 mm rightward midline shift.  The other enhancing lesion was 4 mm in the right occipital lobe with a small amount of surrounding edema.  No acute or chronic hemorrhage.   10/07/2021 Progression   Headache after colonoscopy, progressive difficulty using her right arm.  Progressively worsening of speech. Stage IV lung cancer with brain metastasis.  started on Dexamethasone.  He was seen by neurosurgery and radiation oncology.  Recommendation is to proceed with SRS.  Patient was discharged with dexamethasone   10/16/2021 Imaging   PET scan showed postradiation changes in the right supra hilar lung without evidence of local recurrence.  No evidence of lung cancer recurrence or metastatic disease.  Single focus of intense metabolic activity associated with posterior left 11th rib likely secondary to recent fall/posttraumatic metabolic changes.  Frontal  uptake in the left frontal lobe corresponding to the known metastatic lesion in the brain   10/29/2021 -  Radiation Therapy   Brain Radiation [facility]   12/10/2021 Imaging   Brain MRI: Continued interval decrease in size of a left frontal lobe metastasis, now measuring 10 mm.  Moderate surrounding edema has also decreased.  3-4 mm metastasis within the right occipital lobe, not significantly changed in size. There is now a small focus of central non enhancement within this lesion. Increased edema. 3 mm enhancing metastasis within the high left parietal lobe. In retrospect, this was likely present as a subtle punctate focus of enhancement on the prior brain MRI of 10/22/2021.   01/18/2022 Imaging   CT chest abdomen pelvis w contrast 1. Stable post treatment changes in the right upper lobe medially with dense radiation fibrosis but no findings to suggest local recurrent tumor. 2. Significant progression of ill-defined ground-glass opacity, interstitial thickening and airspace nodularity in the right upper lobe and upper aspect of the right lower lobe. Findings could reflect an inflammatory or atypical infectious process, drug induced pneumonitis or interstitial spread of tumor.3. No mediastinal or hilar mass or adenopathy. 4. No findings for abdominal/pelvic metastatic disease or osseous metastatic disease.   02/19/2022 Imaging   MRI brain w wo contrast showed Three metastatic deposits in the brain appear smaller. Decreased edema in the left parietal lobe. No new lesions    04/04/2022 Imaging   CT chest w contrast 1. Stable radiation changes in the right perihilar region without evidence of local recurrence. 2. Interval improvement  in the patchy ground-glass opacities previously demonstrated in both lungs, most consistent with a resolving infectious or inflammatory process (including immunotherapy-related pneumonitis). 3. No evidence of metastatic disease.    Today he feels well.Intermittent headache, is better. denies any focal deficits. chronic SOB, no chest pain wheezing, cough.  During the interval he had a mechanical fall and fractured his left elbow. Was treated in ER and he wears sling. He is going to see orthopedic surgeon next week.  He takes pain medication PRN  for pain.    Review of Systems  Constitutional:  Positive for fatigue. Negative for appetite change, chills, fever and unexpected weight change.  HENT:   Negative for hearing loss and voice change.   Eyes:  Negative for eye problems and icterus.  Respiratory:  Negative for chest tightness, cough and shortness of breath.   Cardiovascular:  Negative for chest pain and leg swelling.  Gastrointestinal:  Negative for abdominal distention and abdominal pain.  Endocrine: Negative for hot flashes.  Genitourinary:  Negative for difficulty urinating, dysuria and frequency.   Musculoskeletal:  Negative for arthralgias.       Left elbow fracture  Skin:  Negative for itching and rash.  Neurological:  Positive for headaches. Negative for light-headedness and numbness.  Hematological:  Negative for adenopathy. Does not bruise/bleed easily.  Psychiatric/Behavioral:  Negative for confusion.       MEDICAL HISTORY:  Past Medical History:  Diagnosis Date   Allergy    Cancer ([place])    lung   COPD (chronic obstructive pulmonary disease) (HCC)    Dyspnea    former smoker. only doe   History of kidney stones    Hypertension    Peripheral vascular disease ([place])    Pre-diabetes    Sleep apnea     SURGICAL HISTORY: Past Surgical History:  Procedure Laterality Date   ANTERIOR CERVICAL DECOMP/DISCECTOMY FUSION N/A 03/14/2020   Procedure: ANTERIOR CERVICAL DECOMPRESSION/DISCECTOMY FUSION 3 LEVELS C4-7;  Surgeon: Meade Maw, MD;  Location: [facility] ORS;  Service: Neurosurgery;  Laterality: N/A;   BACK SURGERY  2011   CENTRAL LINE INSERTION Right 09/10/2016   Procedure: CENTRAL LINE INSERTION;  Surgeon: Katha Cabal, MD;  Location: [facility] ORS;  Service: Vascular;  Laterality: Right;   CERVICAL FUSION     C4-c7 on Mar 14, 2020   COLONOSCOPY  2013 ?   COLONOSCOPY N/A 09/09/2021   Procedure: COLONOSCOPY;  Surgeon: [name], DO;  Location: [facility] ENDOSCOPY;  Service: Gastroenterology;   Laterality: N/A;   CORONARY ANGIOPLASTY     ESOPHAGOGASTRODUODENOSCOPY N/A 09/09/2021   Procedure: ESOPHAGOGASTRODUODENOSCOPY (EGD);  Surgeon: [name], DO;  Location: [facility] ENDOSCOPY;  Service: Gastroenterology;  Laterality: N/A;   FEMORAL-POPLITEAL BYPASS GRAFT Right 09/10/2016   Procedure: BYPASS GRAFT FEMORAL-POPLITEAL ARTERY ( BELOW KNEE );  Surgeon: Katha Cabal, MD;  Location: [facility] ORS;  Service: Vascular;  Laterality: Right;   FEMORAL-TIBIAL BYPASS GRAFT Right 03/23/2020   Procedure: BYPASS GRAFT RIGHT FEMORAL- DISTAL TIBIAL ARTERY WITH DISTAL FLOW GRAFT;  Surgeon: Katha Cabal, MD;  Location: [facility] ORS;  Service: Vascular;  Laterality: Right;   LEFT HEART CATH AND CORONARY ANGIOGRAPHY Left 05/16/2021   Procedure: LEFT HEART CATH AND CORONARY ANGIOGRAPHY;  Surgeon: Andrez Grime, MD;  Location: [place] CV LAB;  Service: Cardiovascular;  Laterality: Left;   LOWER EXTREMITY ANGIOGRAPHY Right 11/18/2016   Procedure: Lower Extremity Angiography;  Surgeon: Katha Cabal, MD;  Location: [place] [facility] LAB;  Service: Cardiovascular;  Laterality:  Right;   LOWER EXTREMITY ANGIOGRAPHY Right 12/09/2016   Procedure: Lower Extremity Angiography;  Surgeon: Katha Cabal, MD;  Location: [place] [facility];  Service: Cardiovascular;  Laterality: Right;   LOWER EXTREMITY ANGIOGRAPHY Right 11/15/2019   Procedure: LOWER EXTREMITY ANGIOGRAPHY;  Surgeon: Katha Cabal, MD;  Location: [place] [facility];  Service: Cardiovascular;  Laterality: Right;   LOWER EXTREMITY ANGIOGRAPHY Right 12/01/2019   Procedure: Lower Extremity Angiography;  Surgeon: Algernon Huxley, MD;  Location: [place] CV LAB;  Service: Cardiovascular;  Laterality: Right;   LOWER EXTREMITY ANGIOGRAPHY Right 12/02/2019   Procedure: LOWER EXTREMITY ANGIOGRAPHY;  Surgeon: Katha Cabal, MD;  Location: [place] [facility] LAB;  Service: Cardiovascular;  Laterality: Right;   LOWER EXTREMITY  ANGIOGRAPHY Right 03/23/2020   Procedure: Lower Extremity Angiography;  Surgeon: Katha Cabal, MD;  Location: [place] [facility];  Service: Cardiovascular;  Laterality: Right;   LOWER EXTREMITY ANGIOGRAPHY Right 09/25/2021   Procedure: Lower Extremity Angiography;  Surgeon: Katha Cabal, MD;  Location: [place] [facility];  Service: Cardiovascular;  Laterality: Right;   LOWER EXTREMITY ANGIOGRAPHY Right 09/26/2021   Procedure: Lower Extremity Angiography;  Surgeon: Algernon Huxley, MD;  Location: [place] [facility];  Service: Cardiovascular;  Laterality: Right;   LOWER EXTREMITY ANGIOGRAPHY Right 04/08/2022   Procedure: Lower Extremity Angiography;  Surgeon: Katha Cabal, MD;  Location: [place] [facility] LAB;  Service: Cardiovascular;  Laterality: Right;   LOWER EXTREMITY INTERVENTION  11/18/2016   Procedure: Lower Extremity Intervention;  Surgeon: Katha Cabal, MD;  Location: [place] [facility];  Service: Cardiovascular;;   PERIPHERAL VASCULAR CATHETERIZATION Right 01/02/2015   Procedure: Lower Extremity Angiography;  Surgeon: Katha Cabal, MD;  Location: [place] [facility];  Service: Cardiovascular;  Laterality: Right;   PERIPHERAL VASCULAR CATHETERIZATION Right 06/18/2015   Procedure: Lower Extremity Angiography;  Surgeon: Algernon Huxley, MD;  Location: [place] [facility];  Service: Cardiovascular;  Laterality: Right;   PERIPHERAL VASCULAR CATHETERIZATION  06/18/2015   Procedure: Lower Extremity Intervention;  Surgeon: Algernon Huxley, MD;  Location: [place] [facility] LAB;  Service: Cardiovascular;;   PERIPHERAL VASCULAR CATHETERIZATION N/A 10/09/2015   Procedure: Abdominal Aortogram w/Lower Extremity;  Surgeon: Katha Cabal, MD;  Location: [place] [facility];  Service: Cardiovascular;  Laterality: N/A;   PERIPHERAL VASCULAR CATHETERIZATION  10/09/2015   Procedure: Lower Extremity Intervention;  Surgeon: Katha Cabal, MD;  Location: [place] [facility];  Service:  Cardiovascular;;   PERIPHERAL VASCULAR CATHETERIZATION Right 10/10/2015   Procedure: Lower Extremity Angiography;  Surgeon: Algernon Huxley, MD;  Location: [place] [facility] LAB;  Service: Cardiovascular;  Laterality: Right;   PERIPHERAL VASCULAR CATHETERIZATION Right 10/10/2015   Procedure: Lower Extremity Intervention;  Surgeon: Algernon Huxley, MD;  Location: [place] [facility] LAB;  Service: Cardiovascular;  Laterality: Right;   PERIPHERAL VASCULAR CATHETERIZATION Right 12/03/2015   Procedure: Lower Extremity Angiography;  Surgeon: Algernon Huxley, MD;  Location: [place] [facility];  Service: Cardiovascular;  Laterality: Right;   PERIPHERAL VASCULAR CATHETERIZATION Right 12/04/2015   Procedure: Lower Extremity Angiography;  Surgeon: Algernon Huxley, MD;  Location: [place] [facility];  Service: Cardiovascular;  Laterality: Right;   PERIPHERAL VASCULAR CATHETERIZATION  12/04/2015   Procedure: Lower Extremity Intervention;  Surgeon: Algernon Huxley, MD;  Location: [place] [facility];  Service: Cardiovascular;;   PERIPHERAL VASCULAR CATHETERIZATION Right 06/30/2016   Procedure: Lower Extremity Angiography;  Surgeon: Algernon Huxley, MD;  Location: [place] [facility];  Service: Cardiovascular;  Laterality: Right;   PERIPHERAL VASCULAR CATHETERIZATION Right 06/25/2016   Procedure: Lower Extremity Angiography;  Surgeon: Katha Cabal, MD;  Location: [place] [facility];  Service: Cardiovascular;  Laterality: Right;   PERIPHERAL VASCULAR CATHETERIZATION Right 07/01/2016   Procedure: Lower Extremity Angiography;  Surgeon: Katha Cabal, MD;  Location: [place] [facility];  Service: Cardiovascular;  Laterality: Right;   PERIPHERAL VASCULAR CATHETERIZATION  07/01/2016   Procedure: Lower Extremity Intervention;  Surgeon: Katha Cabal, MD;  Location: [place] [facility];  Service: Cardiovascular;;   PERIPHERAL VASCULAR CATHETERIZATION Right 08/01/2016   Procedure: Lower Extremity Angiography;  Surgeon: Katha Cabal, MD;  Location: [place] [facility];  Service: Cardiovascular;  Laterality: Right;   PORTA CATH INSERTION N/A 10/05/2020   Procedure: PORTA CATH INSERTION;  Surgeon: Katha Cabal, MD;  Location: [place] [facility];  Service: Cardiovascular;  Laterality: N/A;   stent placement in right leg Right    VIDEO BRONCHOSCOPY N/A 09/14/2020   Procedure: VIDEO BRONCHOSCOPY WITH FLUORO;  Surgeon: Tyler Pita, MD;  Location: [facility] ORS;  Service: Cardiopulmonary;  Laterality: N/A;   VIDEO BRONCHOSCOPY WITH ENDOBRONCHIAL ULTRASOUND N/A 09/14/2020   Procedure: VIDEO BRONCHOSCOPY WITH ENDOBRONCHIAL ULTRASOUND;  Surgeon: Tyler Pita, MD;  Location: [facility] ORS;  Service: Cardiopulmonary;  Laterality: N/A;    SOCIAL HISTORY: Social History   Socioeconomic History   Marital status: Single    Spouse name: Not on file   Number of children: Not on file   Years of education: Not on file   Highest education level: Not on file  Occupational History   Occupation: unemployed  Tobacco Use   Smoking status: Former    Packs/day: 2.00    Years: 38.00    Total pack years: 76.00    Types: Cigarettes    Quit date: 12/20/2014    Years since quitting: 7.4   Smokeless tobacco: Never   Tobacco comments:    quit smoking in 2017 most smoked 2   Vaping Use   Vaping Use: Former  Substance and Sexual Activity   Alcohol use: Not Currently   Drug use: No   Sexual activity: Yes  Other Topics Concern   Not on file  Social History Narrative   Not on file   Social Determinants of Health   Financial Resource Strain: Not on file  Food Insecurity: Not on file  Transportation Needs: Not on file  Physical Activity: Not on file  Stress: Not on file  Social Connections: Not on file  Intimate Partner Violence: Not on file    FAMILY HISTORY: Family History  Problem Relation Age of Onset   Diabetes Mother    Hypertension Mother    Heart murmur Mother    Leukemia Mother    Throat cancer  Maternal Grandmother    Colon cancer Neg Hx    Breast cancer Neg Hx     ALLERGIES:  has No Known Allergies.  MEDICATIONS:  Current Outpatient Medications  Medication Sig Dispense Refill   aspirin EC 81 MG tablet Take 81 mg by mouth daily. Swallow whole.     clopidogrel (PLAVIX) 75 MG tablet TAKE 1 TABLET BY MOUTH  DAILY 100 tablet 2   Glycopyrrolate-Formoterol (BEVESPI AEROSPHERE) 9-4.8 MCG/ACT AERO Inhale 2 puffs into the lungs 2 (two) times daily. 32.1 g 1   levETIRAcetam (KEPPRA) 500 MG tablet Take 1 tablet (500 mg total) by mouth 2 (two) times daily.  180 tablet 3   levothyroxine (SYNTHROID) 100 MCG tablet Take 1 tablet (100 mcg total) by mouth daily before breakfast. 30 tablet 0   oxyCODONE (OXY IR/ROXICODONE) 5 MG immediate release tablet Take 5 mg by mouth every 4 (four) hours as needed.     simvastatin (ZOCOR) 40 MG tablet Take 40 mg by mouth at bedtime.     No current facility-administered medications for this visit.   Facility-Administered Medications Ordered in Other Visits  Medication Dose Route Frequency Provider Last Rate Last Admin   durvalumab (IMFINZI) 1,000 mg in sodium chloride 0.9 % 100 mL chemo infusion  10 mg/kg (Treatment Plan Recorded) Intravenous Once [name], MD 120 mL/hr at 05/23/22 1000 1,000 mg at 05/23/22 1000   heparin lock flush 100 UNIT/ML injection            heparin lock flush 100 unit/mL  500 Units Intracatheter Once PRN [name], MD         PHYSICAL EXAMINATION: ECOG PERFORMANCE STATUS: 1 - Symptomatic but completely ambulatory Vitals:   05/23/22 0840  BP: 121/77  Pulse: 74  Resp: 16  Temp: 99 F (37.2 C)  SpO2: 97%   Filed Weights   05/23/22 0840  Weight: 218 lb (98.9 kg)    Physical Exam Constitutional:      General: He is not in acute distress.    Appearance: He is not diaphoretic.     Comments: Patient walks with a cane  HENT:     Head: Normocephalic and atraumatic.     Nose: Nose normal.     Mouth/Throat:     Pharynx: No  oropharyngeal exudate.  Eyes:     General: No scleral icterus.    Pupils: Pupils are equal, round, and reactive to light.  Cardiovascular:     Rate and Rhythm: Normal rate and regular rhythm.     Heart sounds: No murmur heard. Pulmonary:     Effort: Pulmonary effort is normal. No respiratory distress.     Breath sounds: No rales.  Chest:     Chest wall: No tenderness.  Abdominal:     General: There is no distension.     Palpations: Abdomen is soft.     Tenderness: There is no abdominal tenderness.  Musculoskeletal:     Cervical back: Normal range of motion and neck supple.     Comments: Left upper extremity in sling.  + left hand swelling.   Skin:    General: Skin is warm and dry.     Findings: No erythema.  Neurological:     Mental Status: He is alert and oriented to person, place, and time.     Cranial Nerves: No cranial nerve deficit.     Motor: No abnormal muscle tone.     Coordination: Coordination normal.  Psychiatric:        Mood and Affect: Affect normal.      LABORATORY DATA:  I have reviewed the data as listed    Latest Ref Rng & Units 05/23/2022    8:30 AM 05/09/2022    7:57 AM 04/25/2022    9:01 AM  CBC  WBC 4.0 - 10.5 K/uL 4.8  4.3  5.1   Hemoglobin 13.0 - 17.0 g/dL 11.3  12.4  12.0   Hematocrit 39.0 - 52.0 % 35.5  38.6  37.3   Platelets 150 - 400 K/uL 219  209  212       Latest Ref Rng & Units 05/23/2022    8:30  AM 05/09/2022    7:57 AM 04/25/2022    9:01 AM  CMP  Glucose 70 - 99 mg/dL 113  163  128   BUN 8 - 23 mg/dL _0 Creatinine 0.61 - 1.24 mg/dL 0.83  0.96  1.22   Sodium 135 - 145 mmol/L 137  141  143   Potassium 3.5 - 5.1 mmol/L 3.5  3.4  3.4   Chloride 98 - 111 mmol/L 103  110  109   CO2 22 - 32 mmol/L _1 Calcium 8.9 - 10.3 mg/dL 8.8  8.8  9.2   Total Protein 6.5 - 8.1 g/dL 6.6  6.9  6.7   Total Bilirubin 0.3 - 1.2 mg/dL 0.6  0.3  0.8   Alkaline Phos 38 - 126 U/L 63  66  74   AST 15 - 41 U/L _2 ALT 0 - 44  U/L _3 RADIOGRAPHIC STUDIES: I have personally reviewed the radiological images as listed and agreed with the findings in the report. CT ELBOW LEFT WO CONTRAST  Result Date: 05/21/2022 CLINICAL DATA:  Fall, follow-up left elbow x-rays EXAM: CT OF THE UPPER LEFT EXTREMITY WITHOUT CONTRAST TECHNIQUE: Multidetector CT imaging of the upper left extremity was performed according to the standard protocol. RADIATION DOSE REDUCTION: This exam was performed according to the departmental dose-optimization program which includes automated exposure control, adjustment of the mA and/or kV according to patient size and/or use of iterative reconstruction technique. COMPARISON:  Left elbow radiograph 05/14/2022 FINDINGS: Bones/Joint/Cartilage There is a minimally displaced supracondylar fracture of the humerus with associated moderate-sized joint effusion. There is up to 4 mm cortical offset at the medial margin. No condylar split component. Ligaments Suboptimally assessed by CT. Muscles and Tendons No acute myotendinous abnormality or intramuscular collection on noncontrast CT. Soft tissues There is soft tissue swelling along the elbow. IMPRESSION: Minimally displaced supracondylar fracture of the distal humerus. Moderate-sized joint effusion. Electronically Signed   By: Maurine Simmering M.D.   On: 05/21/2022 13:26   DG Elbow Complete Left  Result Date: 05/14/2022 CLINICAL DATA:  [place] today with elbow pain EXAM: LEFT ELBOW - COMPLETE 3+ VIEW COMPARISON:  None Available. FINDINGS: Supracondylar fracture of the humerus without angulation or displacement. Joint effusion is present. No fracture of the radius or ulna. IMPRESSION: Supracondylar fracture of the humerus without angulation or displacement. Electronically Signed   By: Nelson Chimes M.D.   On: 05/14/2022 14:32   VAS [place] ABI WITH/WO TBI  Result Date: 05/12/2022  LOWER EXTREMITY DOPPLER STUDY Patient Name:  Joshua [name]  Date of Exam:    05/05/2022 Medical Rec #: [id number]            Accession #:    [id number] Date of Birth: 07-25-1955           Patient Gender: M Patient Age:   41 years Exam Location:  [facility] Procedure:      VAS [place] ABI WITH/WO TBI Referring Phys: --------------------------------------------------------------------------------  Indications: Claudication, peripheral artery disease, and Routine f/u. High Risk Factors: Hyperlipidemia. Other Factors: ASO s/p multiple interventions and bypass.                11/15/2019 Right thrombectomy of Fempop insitu graft with added                stent throughout.  12/01/2019: Aortogram and Selctive Right Lower Extremity                Angiogram. 6 mg of TPA delivered in a lysis catheterfrom the                Origin of the Right Femoral -Popliteal bypass down the proximal                ATA with placement of the lysis.                 12/02/2019: PTA of the Right Posterior Tibial Artery. PTA of the                Right Peroneal Artery. Mechanical Thrombectomy of the Right                Femoral -Popliteal Bypass graft. Mechanical Thrombectomy of the                Right Tibioperoneal Trunk and Posterior Tibial Artery.                 09/25/2021: Right Lower Extremity Angiography third order                catheter placement. PTA of the proximal peroneal and                tibioperoneal trunk to 2.5 mm with an ultra score balloon. PTA of                the Femoral Artery and the proximal anstomosis of the bypass                graft to 4 mm with an ultra score balloon. Administration of 5 mg                of tpa throughout the entire bypass and in the peroneal Artery.                Initiation of continuous tpa infusion Right lower Extremity for                limb salvage. Insertion of a triple lumen catheter Left CFV with                [place] and fluoroscopic guidance.                 09/26/2021: Right LE Angiogram. PTA of the Right Posterior Tibial                 Artery with 2.5 mm diameter by 30 cm length angioplasty balloon.                PTA of the distal bypass anastomosis and distal bypass graft                analogous to the Popliteal artery with 4 mm diameter by 8 cm                length Lutonix drug coated angioplasty balloon.                04/08/2022 right thrombectomy of the Deep profunda.  Vascular Interventions: Hx of multiple right lower extremity PTA's/stents.                         09/10/2016 Right femoral to below the knee popliteal  artery bypass graft placement. 5/01 Right bypass graft &                         mid popliteal artery PTA & coil embolectomy of large                         branch of bypass graft. 12/09/16 Right distal bypass                         graft PTA/stent and right posterior tibial artery PTA.                         03/23/2020 Rt PTA thrombectomy. Rt Profunda fem artery to                         PTA bypass. Comparison Study: 03/10/2022 Performing Technologist: Concha Norway RVT  Examination Guidelines: A complete evaluation includes at minimum, Doppler waveform signals and systolic blood pressure reading at the level of bilateral brachial, anterior tibial, and posterior tibial arteries, when vessel segments are accessible. Bilateral testing is considered an integral part of a complete examination. Photoelectric Plethysmograph (PPG) waveforms and toe systolic pressure readings are included as required and additional duplex testing as needed. Limited examinations for reoccurring indications may be performed as noted.  ABI Findings: +---------+------------------+-----+----------+--------+ Right    Rt Pressure (mmHg)IndexWaveform  Comment  +---------+------------------+-----+----------+--------+ Brachial 135                                       +---------+------------------+-----+----------+--------+ ATA                             absent              +---------+------------------+-----+----------+--------+ PTA      72                0.53 monophasic         +---------+------------------+-----+----------+--------+ PERO     88                0.65 monophasic         +---------+------------------+-----+----------+--------+ Great Toe36                0.27 Abnormal           +---------+------------------+-----+----------+--------+ +---------+------------------+-----+---------+-------+ Left     Lt Pressure (mmHg)IndexWaveform Comment +---------+------------------+-----+---------+-------+ Brachial 135                                     +---------+------------------+-----+---------+-------+ ATA      160               1.19 triphasic        +---------+------------------+-----+---------+-------+ PTA      146               1.08 triphasic        +---------+------------------+-----+---------+-------+ Great Toe155               1.15 Normal           +---------+------------------+-----+---------+-------+ +-------+-----------+-----------+------------+------------+ ABI/TBIToday's ABIToday's TBIPrevious ABIPrevious TBI +-------+-----------+-----------+------------+------------+ Right  .65        .  27        .28         .00          +-------+-----------+-----------+------------+------------+ Left   1.19       1.15       1.21        .96          +-------+-----------+-----------+------------+------------+ Right ABIs and TBIs appear increased compared to prior study on 02/2022.  Summary: Right: Resting right ankle-brachial index indicates moderate right lower extremity arterial disease. The right toe-brachial index is abnormal. Left: Resting left ankle-brachial index is within normal range. The left toe-brachial index is normal. *See table(s) above for measurements and observations.  Electronically signed by [name] MD on 05/12/2022 at 1:04:36 PM.    Final    PERIPHERAL VASCULAR CATHETERIZATION  Result Date:  04/08/2022 See surgical note for result.  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 Chemotherapy, radiation and immunotherapy completed. * Tracking Code: BO * EXAM: CT CHEST WITH CONTRAST TECHNIQUE: Multidetector CT imaging of the chest was performed during intravenous contrast administration. RADIATION DOSE REDUCTION: This exam was performed according to the departmental dose-optimization program which includes automated exposure control, adjustment of the mA and/or kV according to patient size and/or use of iterative reconstruction technique. CONTRAST:  13m OMNIPAQUE IOHEXOL 300 MG/ML  SOLN COMPARISON:  Multiple priors, including CT 01/17/2022 and PET-CT 10/16/2021. FINDINGS: Cardiovascular: Right IJ Port-A-Cath extends to the superior cavoatrial junction. No acute vascular findings. Minimal aortic and coronary artery atherosclerosis. The heart size is normal. There is no pericardial effusion. Mediastinum/Nodes: There are no enlarged mediastinal, hilar or axillary lymph nodes.Stable right perihilar soft tissue thickening. No definite thyroid tissue identified. The esophagus and trachea appear unremarkable. Lungs/Pleura: Trace right pleural effusion. No pneumothorax. There are stable radiation changes in the right perihilar region with architectural distortion and traction bronchiectasis. No recurrent mass lesion identified. The patchy ground-glass opacity seen on the most recent examination have improved. No suspicious pulmonary nodules. Upper abdomen: Mild hepatic steatosis. The visualized upper abdomen otherwise appears unremarkable. Musculoskeletal/Chest wall: There is no chest wall mass or suspicious osseous finding. Old left-sided rib fractures. IMPRESSION: 1. Stable radiation changes in the right perihilar region without evidence of local recurrence. 2. Interval improvement in the patchy  ground-glass opacities previously demonstrated in both lungs, most consistent with a resolving infectious or inflammatory process (including immunotherapy-related pneumonitis). 3. No evidence of metastatic disease. Electronically Signed   By: WRichardean SaleM.D.   On: 04/06/2022 11:53   VAS [place] EXTREMITY ARTERIAL DUPLEX  Result Date: 03/10/2022 LOWER EXTREMITY ARTERIAL DUPLEX STUDY Patient Name:  TDELRICK DEHART Date of Exam:   03/10/2022 Medical Rec #: [id number]           Accession #:    [id number] of Birth: [birth year]          Patient Gender: M Patient Age:   89years Exam Location:  [facility] Procedure:      VAS [place] EXTREMITY ARTERIAL DUPLEX Referring Phys: GHortencia Pilar--------------------------------------------------------------------------------  Indications: Claudication, peripheral artery disease, and pain post workout. High Risk Factors: Past history of smoking. Other Factors: ASO s/p multiple interventions and bypass.                11/15/2019 Right thrombectomy of Fempop insitu graft with added  stent throughout.                 12/01/2019: Aortogram and Selective Right Lower Extremity                Angiogram. 6 mg of TPA delivered in a lysis catheter from the                Origin of the Right Femoral -Popliteal bypass down the proximal                ATA with placement of the lysis.                 12/02/2019: PTA of the Right Posterior Tibial Artery. PTA of the                Right Peroneal Artery. Mechanical Thrombectomy of the Right                Femoral -Popliteal Bypass graft. Mechanical Thrombectomy of the                Right Tibioperoneal Trunk and Posterior Tibial Artery.                 09/25/2021: Right Lower Extremity Angiography third order                catheter placement. PTA of the proximal peroneal and                tibioperoneal trunk to 2.5 mm with an ultra score balloon. PTA of                the Femoral Artery and the  proximal anastomosis of the bypass                graft to 4 mm with an ultra score balloon. Administration of 5 mg                of tpa throughout the entire bypass and in the peroneal Artery.                Initiation of continuous tpa infusion Right lower Extremity for                limb salvage. Insertion of a triple lumen catheter Left CFV with                [place] and fluoroscopic guidance.                 09/26/2021: Right LE Angiogram. PTA of the Right Posterior Tibial                Artery with 2.5 mm diameter by 30 cm length angioplasty balloon.                PTA of the distal bypass anastomosis and distal bypass graft                analogous to the Popliteal artery with 4 mm diameter by 8 cm                length Lutonix drug coated angioplasty balloon.  Vascular Interventions: Hx of multiple right lower extremity PTA's/stents.                         09/10/2016 Right femoral to below the knee popliteal  artery bypass graft placement. 5/01 Right bypass graft &                         mid popliteal artery PTA & coil embolectomy of large                         branch of bypass graft. 12/09/16 Right distal bypass                         graft PTA/stent and right posterior tibial artery PTA.                         03/23/2020 Rt PTA thrombectomy. Rt Profunda fem artery to                         PTA bypass. Current ABI:            Right: 0.28, Left: 1.21 Performing Technologist: Delorise Shiner RVT  Examination Guidelines: A complete evaluation includes B-mode imaging, spectral Doppler, color Doppler, and power Doppler as needed of all accessible portions of each vessel. Bilateral testing is considered an integral part of a complete examination. Limited examinations for reoccurring indications may be performed as noted.  +----------+--------+-----+--------+----------+--------+ RIGHT     PSV cm/sRatioStenosisWaveform  Comments +----------+--------+-----+--------+----------+--------+  CFA Distal76                   triphasic          +----------+--------+-----+--------+----------+--------+ DFA       47                   biphasic           +----------+--------+-----+--------+----------+--------+ SFA Prox  0                                       +----------+--------+-----+--------+----------+--------+ SFA Mid   0                                       +----------+--------+-----+--------+----------+--------+ SFA Distal0                                       +----------+--------+-----+--------+----------+--------+ POP Prox  35                   monophasic         +----------+--------+-----+--------+----------+--------+ PTA Distal12                   monophasic         +----------+--------+-----+--------+----------+--------+  Right Graft #1: Fem- pop +------------------+--------+--------+----------+--------+                   PSV cm/sStenosisWaveform  Comments +------------------+--------+--------+----------+--------+ Inflow            76              monophasic         +------------------+--------+--------+----------+--------+ Prox Anastomosis  0                                  +------------------+--------+--------+----------+--------+  Proximal Graft    0                                  +------------------+--------+--------+----------+--------+ Mid Graft         0                                  +------------------+--------+--------+----------+--------+ Distal Graft      0                                  +------------------+--------+--------+----------+--------+ Distal Anastomosis0                                  +------------------+--------+--------+----------+--------+ Outflow           35              monophasic         +------------------+--------+--------+----------+--------+   Summary: Right: Total occlusion noted in the superficial femoral artery. Occluded right fem-pop bypass graft.  See  table(s) above for measurements and observations. Electronically signed by [name] MD on 03/10/2022 at 103:05:38 PM.    Final    VAS [place] ABI WITH/WO TBI  Result Date: 03/10/2022  LOWER EXTREMITY DOPPLER STUDY Patient Name:  Joshua [name]  Date of Exam:   03/10/2022 Medical Rec #: [id number]            Accession #:    [id number] Date of Birth: 1956-01-01           Patient Gender: M Patient Age:   46 years Exam Location:  [facility] Procedure:      VAS [place] ABI WITH/WO TBI Referring Phys: [facility] --------------------------------------------------------------------------------  Indications: Claudication, peripheral artery disease, and Routine f/u. High Risk Factors: Hyperlipidemia, past history of smoking. Other Factors: ASO s/p multiple interventions and bypass.                11/15/2019 Right thrombectomy of Fempop insitu graft with added                stent throughout.                 12/01/2019: Aortogram and Selective Right Lower Extremity                Angiogram. 6 mg of TPA delivered in a lysis catheter from the                Origin of the Right Femoral -Popliteal bypass down the proximal                ATA with placement of the lysis.                 12/02/2019: PTA of the Right Posterior Tibial Artery. PTA of the                Right Peroneal Artery. Mechanical Thrombectomy of the Right                Femoral -Popliteal Bypass graft. Mechanical Thrombectomy of the                Right Tibioperoneal Trunk and Posterior Tibial Artery.  09/25/2021: Right Lower Extremity Angiography third order                catheter placement. PTA of the proximal peroneal and                tibioperoneal trunk to 2.5 mm with an ultra score balloon. PTA of                the Femoral Artery and the proximal anastomosis of the bypass                graft to 4 mm with an ultra score balloon. Administration of 5 mg                of tpa throughout the entire bypass and in the peroneal  Artery.                Initiation of continuous tpa infusion Right lower Extremity for                limb salvage. Insertion of a triple lumen catheter Left CFV with                [place] and fluoroscopic guidance.                 09/26/2021: Right LE Angiogram. PTA of the Right Posterior Tibial                Artery with 2.5 mm diameter by 30 cm length angioplasty balloon.                PTA of the distal bypass anastomosis and distal bypass graft                analogous to the Popliteal artery with 4 mm diameter by 8 cm                length Lutonix drug coated angioplasty balloon.  Vascular Interventions: Hx of multiple right lower extremity PTA's/stents.                         09/10/2016 Right femoral to below the knee popliteal                         artery bypass graft placement. 5/01 Right bypass graft &                         mid popliteal artery PTA & coil embolectomy of large                         branch of bypass graft. 12/09/16 Right distal bypass                         graft PTA/stent and right posterior tibial artery PTA.                         03/23/2020 Rt PTA thrombectomy. Rt Profunda fem artery to                         PTA bypass. Performing Technologist: Delorise Shiner RVT  Examination Guidelines: A complete evaluation includes at minimum, Doppler waveform signals and systolic blood pressure reading at the level of bilateral brachial, anterior tibial, and posterior tibial arteries, when vessel segments are  accessible. Bilateral testing is considered an integral part of a complete examination. Photoelectric Plethysmograph (PPG) waveforms and toe systolic pressure readings are included as required and additional duplex testing as needed. Limited examinations for reoccurring indications may be performed as noted.  ABI Findings: +---------+------------------+-----+----------+--------+ Right    Rt Pressure (mmHg)IndexWaveform  Comment  +---------+------------------+-----+----------+--------+  Brachial 147                                       +---------+------------------+-----+----------+--------+ ATA      33                0.22 monophasic         +---------+------------------+-----+----------+--------+ PTA      42                0.28 monophasic         +---------+------------------+-----+----------+--------+ Great Toe0                 0.00                    +---------+------------------+-----+----------+--------+ +---------+------------------+-----+---------+-------+ Left     Lt Pressure (mmHg)IndexWaveform Comment +---------+------------------+-----+---------+-------+ Brachial 152                                     +---------+------------------+-----+---------+-------+ ATA      184               1.21 triphasic        +---------+------------------+-----+---------+-------+ PTA      180               1.18 triphasic        +---------+------------------+-----+---------+-------+ Great Toe146               0.96                  +---------+------------------+-----+---------+-------+ +-------+-----------+-----------+------------+------------+ ABI/TBIToday's ABIToday's TBIPrevious ABIPrevious TBI +-------+-----------+-----------+------------+------------+ Right  0.28       0.00       1.06        0.97         +-------+-----------+-----------+------------+------------+ Left   1.21       0.96       1.33        1.25         +-------+-----------+-----------+------------+------------+ Right ABIs appear decreased compared to prior study on 10/23/2021. Left ABIs appear essentially unchanged compared to prior study on 10/23/2021.  Summary: Right: Resting right ankle-brachial index indicates critical limb ischemia. The right toe-brachial index is abnormal. Left: Resting left ankle-brachial index is within normal range. The left toe-brachial index is normal. *See table(s) above for measurements and observations.  Electronically signed by [name] MD on 03/10/2022 at 6:05:29 PM.    Final

## 2022-05-23 NOTE — Telephone Encounter (Signed)
Message sent to CT scheduling, 11/3 9:00am order recieved 11/3

## 2022-05-23 NOTE — Patient Instructions (Signed)
[facility] AT [place]  Discharge Instructions: Thank you for choosing [facility] to provide your oncology and hematology care.  If you have a lab appointment with the [facility], please go directly to the [facility] and check in at the registration area.  Wear comfortable clothing and clothing appropriate for easy access to any Portacath or PICC line.   We strive to give you quality time with your provider. You may need to reschedule your appointment if you arrive late (15 or more minutes).  Arriving late affects you and other patients whose appointments are after yours.  Also, if you miss three or more appointments without notifying the office, you may be dismissed from the clinic at the provider's discretion.      For prescription refill requests, have your pharmacy contact our office and allow 72 hours for refills to be completed.    Today you received the following chemotherapy and/or immunotherapy agents Imfiniz   To help prevent nausea and vomiting after your treatment, we encourage you to take your nausea medication as directed.  BELOW ARE SYMPTOMS THAT SHOULD BE REPORTED IMMEDIATELY: *FEVER GREATER THAN 100.4 F (38 C) OR HIGHER *CHILLS OR SWEATING *NAUSEA AND VOMITING THAT IS NOT CONTROLLED WITH YOUR NAUSEA MEDICATION *UNUSUAL SHORTNESS OF BREATH *UNUSUAL BRUISING OR BLEEDING *URINARY PROBLEMS (pain or burning when urinating, or frequent urination) *BOWEL PROBLEMS (unusual diarrhea, constipation, pain near the anus) TENDERNESS IN MOUTH AND THROAT WITH OR WITHOUT PRESENCE OF ULCERS (sore throat, sores in mouth, or a toothache) UNUSUAL RASH, SWELLING OR PAIN  UNUSUAL VAGINAL DISCHARGE OR ITCHING   Items with * indicate a potential emergency and should be followed up as soon as possible or go to the Emergency Department if any problems should occur.  Please show the CHEMOTHERAPY ALERT CARD or IMMUNOTHERAPY ALERT CARD at check-in to the  Emergency Department and triage nurse.  Should you have questions after your visit or need to cancel or reschedule your appointment, please contact [facility] [place] AT [place]  [phone] and follow the prompts.  Office hours are 8:00 a.m. to 4:30 p.m. Monday - Friday. Please note that voicemails left after 4:00 p.m. may not be returned until the following business day.  We are closed weekends and major holidays. You have access to a nurse at all times for urgent questions. Please call the main number to the clinic [phone] and follow the prompts.  For any non-urgent questions, you may also contact your provider using MyChart. We now offer e-Visits for anyone 3 and older to request care online for non-urgent symptoms. For details visit [email or web address].   Also download the MyChart app! Go to the app store, search "MyChart", open the app, select [facility], and log in with your MyChart username and password.  Masks are optional in the cancer centers. If you would like for your care team to wear a mask while they are taking care of you, please let them know. For doctor visits, patients may have with them one support person who is at least 64 years old. At this time, visitors are not allowed in the infusion area.

## 2022-05-23 NOTE — Assessment & Plan Note (Signed)
Continue Plavix and follow up with vascular surgery. Dr.Schnier recently stopped Eliquis

## 2022-05-23 NOTE — Assessment & Plan Note (Signed)
Recommend patient to take Calcium 1200mg  daily.

## 2022-05-26 ENCOUNTER — Ambulatory Visit: Payer: Medicare Other | Admitting: Radiation Oncology

## 2022-05-26 ENCOUNTER — Telehealth: Payer: Self-pay | Admitting: Oncology

## 2022-05-26 NOTE — Telephone Encounter (Signed)
Per [facility] MW:He is on my list to cb. Called him on Friday and left a msg. He is actually my next cb

## 2022-05-27 ENCOUNTER — Telehealth: Payer: Self-pay

## 2022-05-27 NOTE — Telephone Encounter (Signed)
Pt informed of plan and recommendations.   Please check if CT can be done this week/ ASAP.   He will need to see Dr. Baruch Gouty after CT. He is scheduled to see Dr. Baruch Gouty tomorrow, but I informed him that we will cancel that appt and it will be r/s once CT has been r/s. Maudie Mercury D aware.

## 2022-05-27 NOTE — Telephone Encounter (Signed)
-----   Message from [name], MD sent at 05/27/2022 11:06 AM EST ----- Please let him know that Brain MRI shows enlarging brain lesions.  Please move his next CT this week / ASAP. Currently scheduled in Dec.  He also needs appt with Dr.Chrystal after this CT - I discussed with Dr.Chrystal.  Thanks.

## 2022-05-27 NOTE — Telephone Encounter (Signed)
CT has been rescheduled to 11/8. Please contact pt with new appt infor once rad onc appt has been r/s

## 2022-05-28 ENCOUNTER — Encounter: Payer: Self-pay | Admitting: Oncology

## 2022-05-28 ENCOUNTER — Ambulatory Visit: Payer: Medicare Other | Admitting: Radiation Oncology

## 2022-05-28 ENCOUNTER — Ambulatory Visit
Admission: RE | Admit: 2022-05-28 | Discharge: 2022-05-28 | Disposition: A | Discharge status: Home / Self Care | Payer: Medicare Other | Source: Ambulatory Visit | Attending: Oncology | Admitting: Oncology

## 2022-05-28 DIAGNOSIS — C3491 Malignant neoplasm of unspecified part of right bronchus or lung: Secondary | ICD-10-CM | POA: Diagnosis present

## 2022-05-28 MED ORDER — IOHEXOL 300 MG/ML  SOLN
100.0000 mL | Freq: Once | INTRAMUSCULAR | Status: AC | PRN
  Administered 2022-05-28: 100 mL via INTRAVENOUS

## 2022-06-02 ENCOUNTER — Encounter: Payer: Self-pay | Admitting: Radiation Oncology

## 2022-06-02 ENCOUNTER — Ambulatory Visit
Admission: RE | Admit: 2022-06-02 | Discharge: 2022-06-02 | Disposition: A | Discharge status: Home / Self Care | Payer: Medicare Other | Source: Ambulatory Visit | Attending: Radiation Oncology | Admitting: Radiation Oncology

## 2022-06-02 ENCOUNTER — Other Ambulatory Visit: Payer: Self-pay | Admitting: Oncology

## 2022-06-02 VITALS — BP 143/85 | HR 78 | Temp 97.7°F | Resp 20

## 2022-06-02 DIAGNOSIS — C3491 Malignant neoplasm of unspecified part of right bronchus or lung: Secondary | ICD-10-CM | POA: Insufficient documentation

## 2022-06-02 DIAGNOSIS — Z923 Personal history of irradiation: Secondary | ICD-10-CM | POA: Insufficient documentation

## 2022-06-02 DIAGNOSIS — C7931 Secondary malignant neoplasm of brain: Secondary | ICD-10-CM | POA: Diagnosis present

## 2022-06-02 NOTE — Progress Notes (Signed)
Radiation Oncology Follow up Note  Name: Joshua [name]   Date:   06/02/2022 MRN:  [id number] DOB: 10/23/1955    This 66 y.o. 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  He is seen today appears to be declining he had a fall recently.  He is having some slurred speech and decreased mental faculties.  Most recent MRI scan shows all 3 brain lesions have increased in size with increasing regional edema.  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  This constellation could represent true progression as in the left cerebellar lesion as well as pseudoprogression/radionecrosis.  There is a central area of necrosis in each of these lesions.  Patient is not on any steroid therapy at this time.  His CT scan of his chest shows stable post radiation changes with masslike fibrosis in the right lung.  No sign of metastatic disease in abdomen or pelvis.  COMPLICATIONS OF TREATMENT: none  FOLLOW UP COMPLIANCE: keeps appointments   PHYSICAL EXAM:  BP (!) 143/85 (BP Location: Right Arm, Patient Position: Sitting, Cuff Size: Normal)   Pulse 78   Temp 97.7 F (36.5 C) (Tympanic)   Resp 20  Wheelchair-bound male recent fracture of his left upper extremity patient is slurring his speech somewhat.  Well-developed well-nourished patient in NAD. HEENT reveals PERLA, EOMI, discs not visualized.  Oral cavity is clear. No oral mucosal lesions are identified. Neck is clear without evidence of cervical or supraclavicular adenopathy. Lungs are  clear to A&P. Cardiac examination is essentially unremarkable with regular rate and rhythm without murmur rub or thrill. Abdomen is benign with no organomegaly or masses noted. Motor sensory and DTR levels are equal and symmetric in the upper and lower extremities. Cranial nerves II through XII are grossly intact. Proprioception is intact. No peripheral adenopathy or edema is identified. No motor or sensory levels are noted. Crude visual fields are within normal range.  RADIOLOGY RESULTS: CT scans chest abdomen pelvis and MRI of brain reviewed and reviewed with his prior exams.  PLAN: At this time I am not sure were dealing with true progression I like to see another MRI in 3 months.  Would contemplate whole brain radiation therapy at this time should you show further progression with other brain lesions or again growth of his 3 previously treated lesions.  I am also referring the patient to Dr. Mickeal Skinner neuro oncologist from [facility] for his opinion.  I have asked to see the patient in 3 months after his next MRI of his brain.  Also discussed the case with medical oncology.  I would like to take this opportunity to thank you for allowing me to participate in the care of your patient.Noreene Filbert, MD

## 2022-06-06 ENCOUNTER — Inpatient Hospital Stay: Payer: Medicare Other

## 2022-06-06 ENCOUNTER — Encounter: Payer: Self-pay | Admitting: Oncology

## 2022-06-06 ENCOUNTER — Inpatient Hospital Stay (HOSPITAL_BASED_OUTPATIENT_CLINIC_OR_DEPARTMENT_OTHER): Payer: Medicare Other | Admitting: Oncology

## 2022-06-06 VITALS — BP 117/74 | HR 79 | Temp 98.6°F | Wt 202.8 lb

## 2022-06-06 DIAGNOSIS — E032 Hypothyroidism due to medicaments and other exogenous substances: Secondary | ICD-10-CM | POA: Diagnosis not present

## 2022-06-06 DIAGNOSIS — C3491 Malignant neoplasm of unspecified part of right bronchus or lung: Secondary | ICD-10-CM

## 2022-06-06 DIAGNOSIS — C7931 Secondary malignant neoplasm of brain: Secondary | ICD-10-CM

## 2022-06-06 DIAGNOSIS — I739 Peripheral vascular disease, unspecified: Secondary | ICD-10-CM | POA: Diagnosis not present

## 2022-06-06 DIAGNOSIS — Z5112 Encounter for antineoplastic immunotherapy: Secondary | ICD-10-CM | POA: Diagnosis not present

## 2022-06-06 LAB — CBC WITH DIFFERENTIAL/PLATELET
Abs Immature Granulocytes: 0.01 10*3/uL (ref 0.00–0.07)
Basophils Absolute: 0.1 10*3/uL (ref 0.0–0.1)
Basophils Relative: 1 %
Eosinophils Absolute: 0.3 10*3/uL (ref 0.0–0.5)
Eosinophils Relative: 8 %
HCT: 39 % (ref 39.0–52.0)
Hemoglobin: 12.6 g/dL — ABNORMAL LOW (ref 13.0–17.0)
Immature Granulocytes: 0 %
Lymphocytes Relative: 16 %
Lymphs Abs: 0.7 10*3/uL (ref 0.7–4.0)
MCH: 27.6 pg (ref 26.0–34.0)
MCHC: 32.3 g/dL (ref 30.0–36.0)
MCV: 85.5 fL (ref 80.0–100.0)
Monocytes Absolute: 0.6 10*3/uL (ref 0.1–1.0)
Monocytes Relative: 14 %
Neutro Abs: 2.7 10*3/uL (ref 1.7–7.7)
Neutrophils Relative %: 61 %
Platelets: 273 10*3/uL (ref 150–400)
RBC: 4.56 MIL/uL (ref 4.22–5.81)
RDW: 13.3 % (ref 11.5–15.5)
WBC: 4.4 10*3/uL (ref 4.0–10.5)
nRBC: 0 % (ref 0.0–0.2)

## 2022-06-06 LAB — COMPREHENSIVE METABOLIC PANEL
ALT: 16 U/L (ref 0–44)
AST: 26 U/L (ref 15–41)
Albumin: 3.9 g/dL (ref 3.5–5.0)
Alkaline Phosphatase: 77 U/L (ref 38–126)
Anion gap: 7 (ref 5–15)
BUN: 18 mg/dL (ref 8–23)
CO2: 26 mmol/L (ref 22–32)
Calcium: 9.3 mg/dL (ref 8.9–10.3)
Chloride: 106 mmol/L (ref 98–111)
Creatinine, Ser: 1.04 mg/dL (ref 0.61–1.24)
GFR, Estimated: >60 mL/min (ref >60)
Glucose, Bld: 137 mg/dL — ABNORMAL HIGH (ref 70–99)
Potassium: 3.6 mmol/L (ref 3.5–5.1)
Sodium: 139 mmol/L (ref 135–145)
Total Bilirubin: 0.4 mg/dL (ref 0.3–1.2)
Total Protein: 7.1 g/dL (ref 6.5–8.1)

## 2022-06-06 LAB — TSH: TSH: 71 u[IU]/mL — ABNORMAL HIGH (ref 0.350–4.500)

## 2022-06-06 MED ORDER — SODIUM CHLORIDE 0.9 % IV SOLN
Freq: Once | INTRAVENOUS | Status: AC
  Filled 2022-06-06: qty 250

## 2022-06-06 MED ORDER — HEPARIN SOD (PORK) LOCK FLUSH 100 UNIT/ML IV SOLN
500.0000 [IU] | Freq: Once | INTRAVENOUS | Status: AC | PRN
  Filled 2022-06-06: qty 5

## 2022-06-06 MED ORDER — DEXAMETHASONE 4 MG PO TABS: 4.0000 mg | ORAL_TABLET | Freq: Every day | ORAL | 0 refills | Status: DC

## 2022-06-06 MED ORDER — SODIUM CHLORIDE 0.9 % IV SOLN
10.0000 mg/kg | Freq: Once | INTRAVENOUS | Status: AC
  Administered 2022-06-06: 1000 mg via INTRAVENOUS
  Filled 2022-06-06: qty 20

## 2022-06-06 MED ORDER — DEXAMETHASONE 4 MG PO TABS: 4.0000 mg | ORAL_TABLET | Freq: Two times a day (BID) | ORAL | 0 refills | Status: DC

## 2022-06-06 MED ORDER — HEPARIN SOD (PORK) LOCK FLUSH 100 UNIT/ML IV SOLN
INTRAVENOUS | Status: AC
  Administered 2022-06-06: 500 [IU]
  Filled 2022-06-06: qty 5

## 2022-06-06 NOTE — Assessment & Plan Note (Addendum)
Continue Plavix and follow up with vascular surgery. Dr.Schnier recently stopped Eliquis Recent CT showed likely  occluded vascular graft in the superior aspect of the right anterior thigh.  I sent a message to Dr.Schnier for further evaluation.

## 2022-06-06 NOTE — Progress Notes (Signed)
Hematology/Oncology Progress note [phone] [phone]    ASSESSMENT & PLAN:   Cancer Staging  Squamous cell carcinoma of right lung [facility]) Staging form: Lung, AJCC 8th Edition - Clinical stage from 09/20/2020: Stage IV (cT1b, cN2, cM1) - Signed by [name], MD on 12/13/2021   Squamous cell carcinoma of right lung ([place]) Durvalumab was previously held due to pneumonitis.  Repeat CT scan showed improvement of patchy ground-glass opacities.  Reviewed and discussed with patient CT chest abdomen pelvis showed no progression.  Labs reviewed and discussed with patient. Proceed with Durvalumab  Check bone scan  PVD (peripheral vascular disease) (HCC) Continue Plavix and follow up with vascular surgery. Dr.Schnier recently stopped Eliquis Recent CT showed likely  occluded vascular graft in the superior aspect of the right anterior thigh.  I sent a message to Dr.Schnier for further evaluation.    Hypothyroidism due to medication Synthroid 100 mcg daily   Malignant neoplasm metastatic to brain [facility]) Status post radiation.  02/19/22 MRI showed decreased brain lesion size and edema.  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 Enlarged 3 brain lesions and surrounding edema changes are more consistent with reactive changes due to previous radiation. I will start patient on Dexamethasone 87m BID x 1 week followed by Dexamethasone 43mdaily. Rx were sent.  He has an appointment with Dr.Vaslow on 12/1  The small left anterior cerebellar lesion is likely a new metastatic lesion, Dr.Vasclow recommend radiation. I sent a inbasket message to Dr.Chrystal.       Orders Placed This Encounter  Procedures   NM Bone Scan Whole Body    Standing Status:   Future    Standing Expiration Date:   06/06/2023     Order Specific Question:   If indicated for the ordered procedure, I authorize the administration of a radiopharmaceutical per Radiology protocol    Answer:   Yes    Order Specific Question:   Preferred imaging location?    Answer:   [place]  Follow up lab MD 2 weeks Durvalumab   All questions were answered. The patient knows to call the clinic with any problems, questions or concerns.  ZhEarlie ServerMD, PhD [facility] 06/06/2022      CHIEF COMPLAINTS/REASON FOR VISIT:  Follow up for lung cancer  HISTORY OF PRESENTING ILLNESS:  Joshua LOEWENs a [age] who has above history reviewed by me today presents for follow up visit for Stage IV lung squamous cell carcinoma. Oncology History  Squamous [place] carcinoma of right lung [place] 09/18/2020 Imaging   PET scan showed right upper lobe nodule maximal SUV 11.5.  Hypermetabolic cluster right paratracheal adenopathy with maximum SUV up to 12.  No findings of metastatic disease to the neck/abdomen/pelvis or skeleton.   09/20/2020 Initial Diagnosis   Squamous cell carcinoma of right lung (HCC) NGS showed PD-L1 TPS 95%, LRP1B [id number] E545K, RB1 c1422-2A>T, RIT1 T83_ A84del, STK11 [id number] R248W, TMB 19.5mut70m, MS stable.    09/20/2020 Cancer Staging   Staging form: Lung, AJCC 8th Edition - Clinical stage from 09/20/2020: Stage IV (cT1b, cN2, cM1) - Signed by Anadalay Macdonell, ZEarlie Serveron 12/13/2021   10/03/2020 Imaging   MRI brain with and without contrast showed no evidence of intracranial metastatic disease. Well-defined round lesion within the left parotid  tail measuring approximately 2.3 x 1.8 x 1.8 cm   10/09/2020 - 11/27/2020 Radiation Therapy   concurrent chemoradiation   10/10/2020 - 10/31/2020 Chemotherapy   10/10/2020 concurrent chemoradiation.  carboplatin AUC of 2 and Taxol 45 mg/m2 x 4.  Additional chemotherapy was held due to thrombocytopenia, neutropenia       12/31/2020 - 03/21/2022  Chemotherapy   Maintenance  Durvalumab q14d      12/31/2020 -  Chemotherapy   Patient is on Treatment Plan : LUNG Durvalumab (10) q14d     03/28/2021 Imaging   CT without contrast showed new linear right perihilar consolidation with associated traction bronchiectasis.  Likely postradiation changes.  Additional new peribronchial vascular/nodular groundglass opacities with areas of consolidation or seen primarily in the right upper and lower lobes, more distant from expected radiation fields. 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  Per my discussion with pulmonology Dr. 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     05/09/2021 Imaging   Repeat CT chest without contrast showed evolving radiation changes in the right perihilar region likely accounting for interval increase to thickening of the posterior wall of the right upper lobe bronchus.  The more peripheral right lung groundglass opacities seen on most recent study have generally improved with exception of one peripherally right upper lobe which appears new.  These are likely inflammatory.  No findings highly suspicious for local recurrence.   05/30/2021 - 01/10/2022 Chemotherapy   Durvalumab q14d     10/07/2021 Imaging   MRI brain with and without contrast showed 2 contrast-enhancing lesions, most consistent with metastatic disease.  Largest lesion is in the left frontal lobe and measures up to 1.3 cm with a large amount of surrounding edema and 9 mm rightward midline shift.  The other enhancing lesion was 4 mm in the right occipital lobe with a small amount of surrounding edema.  No acute or chronic hemorrhage.   10/07/2021 Progression   Headache after colonoscopy, progressive difficulty using her right arm.  Progressively worsening of speech. Stage IV lung cancer with brain metastasis.  started on Dexamethasone.   He was seen by neurosurgery and radiation oncology.  Recommendation is to proceed with SRS.  Patient was discharged with dexamethasone   10/16/2021 Imaging   PET scan showed postradiation changes in the right supra hilar lung without evidence of local recurrence.  No evidence of lung cancer recurrence or metastatic disease.  Single focus of intense metabolic activity associated with posterior left 11th rib likely secondary to recent fall/posttraumatic metabolic changes.  Frontal  uptake in the left frontal lobe corresponding to the known metastatic lesion in the brain   10/29/2021 -  Radiation Therapy   Brain Radiation [facility]   12/10/2021 Imaging   Brain MRI: Continued interval decrease in size of a left frontal lobe metastasis, now measuring 10 mm. Moderate surrounding edema has also decreased.  3-4 mm metastasis within the right occipital lobe, not significantly changed in size. There is now a small focus of central non enhancement within this lesion. Increased edema. 3 mm enhancing metastasis within the high left parietal lobe. In retrospect, this was likely present as a subtle punctate focus of enhancement on the prior brain MRI of 10/22/2021.   01/18/2022 Imaging   CT chest abdomen pelvis w contrast 1. Stable post treatment changes in the right upper lobe medially with dense radiation fibrosis but no findings to  suggest local recurrent tumor. 2. Significant progression of ill-defined ground-glass opacity, interstitial thickening and airspace nodularity in the right upper lobe and upper aspect of the right lower lobe. Findings could reflect an inflammatory or atypical infectious process, drug induced pneumonitis or interstitial spread of tumor.3. No mediastinal or hilar mass or adenopathy. 4. No findings for abdominal/pelvic metastatic disease or osseous metastatic disease.   02/19/2022 Imaging   MRI brain w wo contrast showed Three metastatic deposits in the brain appear smaller. Decreased edema in  the left parietal lobe. No new lesions    04/04/2022 Imaging   CT chest w contrast 1. Stable radiation changes in the right perihilar region without evidence of local recurrence. 2. Interval improvement in the patchy ground-glass opacities previously demonstrated in both lungs, most consistent with a resolving infectious or inflammatory process (including immunotherapy-related pneumonitis). 3. No evidence of metastatic disease.   05/14/2022 Miscellaneous   He had a mechanical fall and fractured his left elbow. xrays showed a left elbow fracture  05/26/22 seen by orthopedic surgeon, cast applied.    05/27/2022 Imaging   MRI brain w wo contrast 1. All three known brain metastases have enlarged since August, with increasing regional edema. And there is a small new left anterior cerebellar 4-5 mm metastasis (was questionably punctate on the May MRI). This constellation could reflect a combination of True Progression (Left Cerebellar lesion) and pseudoprogression/radionecrosis (3 existing mets).    2. No significant intracranial mass effect. 3. Underlying moderate cerebral white matter signal changes.   05/30/2022 Imaging   CT chest abdomen pelvis w contrast  1. 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 There is a stable small right pleural effusion, but no definitive findings to suggest residual or recurrent disease on today's examination. 2. No signs of metastatic disease in the abdomen or pelvis.  3. Aortic atherosclerosis, in addition to two-vessel coronary artery disease. Please note that although the presence of coronary artery calcium documents the presence of coronary artery disease, the severity of this disease and any potential stenosis cannot be assessed on this non-gated CT examination. Assessment for potential risk factor modification, dietary therapy or  pharmacologic therapy may be warranted, if clinically indicated. 4. Hepatic steatosis. 5. Colonic diverticulosis without evidence of acute diverticulitis at this time. 6. Incompletely imaged but apparently occluded vascular graft in the superior aspect of the right anterior thigh. Referral to vascular specialist for further clinical evaluation should be considered if clinically appropriate. 7. Additional incidental findings, as above.    Today he feels well.Intermittent headache, is better. denies any focal deficits. chronic SOB, no chest pain wheezing, cough.  + cast applied to his left elbow.  + right lower extremity weakness worse,  + slurred speech  Review of Systems  Constitutional:  Positive for fatigue. Negative for appetite change, chills, fever and unexpected weight change.  HENT:   Negative for hearing loss and voice change.   Eyes:  Negative for eye problems and icterus.  Respiratory:  Negative for chest tightness, cough and shortness of breath.   Cardiovascular:  Negative for chest pain and leg swelling.  Gastrointestinal:  Negative for abdominal distention and abdominal pain.  Endocrine: Negative for hot flashes.  Genitourinary:  Negative for difficulty urinating, dysuria and frequency.   Musculoskeletal:  Negative for arthralgias.       Left elbow fracture  Skin:  Negative for itching and rash.  Neurological:  Positive for extremity  weakness, headaches and speech difficulty. Negative for light-headedness and numbness.  Hematological:  Negative for adenopathy. Does not bruise/bleed easily.  Psychiatric/Behavioral:  Negative for confusion.       MEDICAL HISTORY:  Past Medical History:  Diagnosis Date   Allergy    Cancer ([place])    lung   COPD (chronic obstructive pulmonary disease) (HCC)    Dyspnea    former smoker. only doe   History of kidney stones    Hypertension    Peripheral vascular disease ([place])    Pre-diabetes    Sleep apnea     SURGICAL  HISTORY: Past Surgical History:  Procedure Laterality Date   ANTERIOR CERVICAL DECOMP/DISCECTOMY FUSION N/A 03/14/2020   Procedure: ANTERIOR CERVICAL DECOMPRESSION/DISCECTOMY FUSION 3 LEVELS C4-7;  Surgeon: Meade Maw, MD;  Location: [facility] ORS;  Service: Neurosurgery;  Laterality: N/A;   BACK SURGERY  2011   CENTRAL LINE INSERTION Right 09/10/2016   Procedure: CENTRAL LINE INSERTION;  Surgeon: Katha Cabal, MD;  Location: [facility] ORS;  Service: Vascular;  Laterality: Right;   CERVICAL FUSION     C4-c7 on Mar 14, 2020   COLONOSCOPY  2013 ?   COLONOSCOPY N/A 09/09/2021   Procedure: COLONOSCOPY;  Surgeon: [name], DO;  Location: [facility]COPY;  Service: Gastroenterology;  Laterality: N/A;   CORONARY ANGIOPLASTY     ESOPHAGOGASTRODUODENOSCOPY N/A 09/09/2021   Procedure: ESOPHAGOGASTRODUODENOSCOPY (EGD);  Surgeon: [name], DO;  Location: [facility] ENDOSCOPY;  Service: Gastroenterology;  Laterality: N/A;   FEMORAL-POPLITEAL BYPASS GRAFT Right 09/10/2016   Procedure: BYPASS GRAFT FEMORAL-POPLITEAL ARTERY ( BELOW KNEE );  Surgeon: Katha Cabal, MD;  Location: [facility] ORS;  Service: Vascular;  Laterality: Right;   FEMORAL-TIBIAL BYPASS GRAFT Right 03/23/2020   Procedure: BYPASS GRAFT RIGHT FEMORAL- DISTAL TIBIAL ARTERY WITH DISTAL FLOW GRAFT;  Surgeon: Katha Cabal, MD;  Location: [facility] ORS;  Service: Vascular;  Laterality: Right;   LEFT HEART CATH AND CORONARY ANGIOGRAPHY Left 05/16/2021   Procedure: LEFT HEART CATH AND CORONARY ANGIOGRAPHY;  Surgeon: Andrez Grime, MD;  Location: [place] CV LAB;  Service: Cardiovascular;  Laterality: Left;   LOWER EXTREMITY ANGIOGRAPHY Right 11/18/2016   Procedure: Lower Extremity Angiography;  Surgeon: Katha Cabal, MD;  Location: [place] CV LAB;  Service: Cardiovascular;  Laterality: Right;   LOWER EXTREMITY ANGIOGRAPHY Right 12/09/2016   Procedure: Lower Extremity Angiography;  Surgeon: Katha Cabal,  MD;  Location: [place] [facility];  Service: Cardiovascular;  Laterality: Right;   LOWER EXTREMITY ANGIOGRAPHY Right 11/15/2019   Procedure: LOWER EXTREMITY ANGIOGRAPHY;  Surgeon: Katha Cabal, MD;  Location: [place] CV LAB;  Service: Cardiovascular;  Laterality: Right;   LOWER EXTREMITY ANGIOGRAPHY Right 12/01/2019   Procedure: Lower Extremity Angiography;  Surgeon: Algernon Huxley, MD;  Location: [place] CV LAB;  Service: Cardiovascular;  Laterality: Right;   LOWER EXTREMITY ANGIOGRAPHY Right 12/02/2019   Procedure: LOWER EXTREMITY ANGIOGRAPHY;  Surgeon: Katha Cabal, MD;  Location: [place] [facility] LAB;  Service: Cardiovascular;  Laterality: Right;   LOWER EXTREMITY ANGIOGRAPHY Right 03/23/2020   Procedure: Lower Extremity Angiography;  Surgeon: Katha Cabal, MD;  Location: [place] CV LAB;  Service: Cardiovascular;  Laterality: Right;   LOWER EXTREMITY ANGIOGRAPHY Right 09/25/2021   Procedure: Lower Extremity Angiography;  Surgeon: Katha Cabal, MD;  Location: [place] [facility] LAB;  Service: Cardiovascular;  Laterality: Right;   LOWER EXTREMITY ANGIOGRAPHY Right 09/26/2021   Procedure: Lower Extremity Angiography;  Surgeon: Algernon Huxley,  MD;  Location: [place] [facility];  Service: Cardiovascular;  Laterality: Right;   LOWER EXTREMITY ANGIOGRAPHY Right 04/08/2022   Procedure: Lower Extremity Angiography;  Surgeon: Katha Cabal, MD;  Location: [place] [facility] LAB;  Service: Cardiovascular;  Laterality: Right;   LOWER EXTREMITY INTERVENTION  11/18/2016   Procedure: Lower Extremity Intervention;  Surgeon: Katha Cabal, MD;  Location: [place] CV LAB;  Service: Cardiovascular;;   PERIPHERAL VASCULAR CATHETERIZATION Right 01/02/2015   Procedure: Lower Extremity Angiography;  Surgeon: Katha Cabal, MD;  Location: [place] [facility];  Service: Cardiovascular;  Laterality: Right;   PERIPHERAL VASCULAR CATHETERIZATION Right 06/18/2015   Procedure:  Lower Extremity Angiography;  Surgeon: Algernon Huxley, MD;  Location: [place] [facility];  Service: Cardiovascular;  Laterality: Right;   PERIPHERAL VASCULAR CATHETERIZATION  06/18/2015   Procedure: Lower Extremity Intervention;  Surgeon: Algernon Huxley, MD;  Location: [place] [facility] LAB;  Service: Cardiovascular;;   PERIPHERAL VASCULAR CATHETERIZATION N/A 10/09/2015   Procedure: Abdominal Aortogram w/Lower Extremity;  Surgeon: Katha Cabal, MD;  Location: [place] [facility] LAB;  Service: Cardiovascular;  Laterality: N/A;   PERIPHERAL VASCULAR CATHETERIZATION  10/09/2015   Procedure: Lower Extremity Intervention;  Surgeon: Katha Cabal, MD;  Location: [place] [facility];  Service: Cardiovascular;;   PERIPHERAL VASCULAR CATHETERIZATION Right 10/10/2015   Procedure: Lower Extremity Angiography;  Surgeon: Algernon Huxley, MD;  Location: [place] [facility] LAB;  Service: Cardiovascular;  Laterality: Right;   PERIPHERAL VASCULAR CATHETERIZATION Right 10/10/2015   Procedure: Lower Extremity Intervention;  Surgeon: Algernon Huxley, MD;  Location: [place] [facility];  Service: Cardiovascular;  Laterality: Right;   PERIPHERAL VASCULAR CATHETERIZATION Right 12/03/2015   Procedure: Lower Extremity Angiography;  Surgeon: Algernon Huxley, MD;  Location: [place] [facility];  Service: Cardiovascular;  Laterality: Right;   PERIPHERAL VASCULAR CATHETERIZATION Right 12/04/2015   Procedure: Lower Extremity Angiography;  Surgeon: Algernon Huxley, MD;  Location: [place] [facility];  Service: Cardiovascular;  Laterality: Right;   PERIPHERAL VASCULAR CATHETERIZATION  12/04/2015   Procedure: Lower Extremity Intervention;  Surgeon: Algernon Huxley, MD;  Location: [place] [facility];  Service: Cardiovascular;;   PERIPHERAL VASCULAR CATHETERIZATION Right 06/30/2016   Procedure: Lower Extremity Angiography;  Surgeon: Algernon Huxley, MD;  Location: [place] [facility];  Service: Cardiovascular;  Laterality: Right;   PERIPHERAL VASCULAR  CATHETERIZATION Right 06/25/2016   Procedure: Lower Extremity Angiography;  Surgeon: Katha Cabal, MD;  Location: [place] [facility];  Service: Cardiovascular;  Laterality: Right;   PERIPHERAL VASCULAR CATHETERIZATION Right 07/01/2016   Procedure: Lower Extremity Angiography;  Surgeon: Katha Cabal, MD;  Location: [place] [facility];  Service: Cardiovascular;  Laterality: Right;   PERIPHERAL VASCULAR CATHETERIZATION  07/01/2016   Procedure: Lower Extremity Intervention;  Surgeon: Katha Cabal, MD;  Location: [place] [facility];  Service: Cardiovascular;;   PERIPHERAL VASCULAR CATHETERIZATION Right 08/01/2016   Procedure: Lower Extremity Angiography;  Surgeon: Katha Cabal, MD;  Location: [place] [facility];  Service: Cardiovascular;  Laterality: Right;   PORTA CATH INSERTION N/A 10/05/2020   Procedure: PORTA CATH INSERTION;  Surgeon: Katha Cabal, MD;  Location: [place] [facility];  Service: Cardiovascular;  Laterality: N/A;   stent placement in right leg Right    VIDEO BRONCHOSCOPY N/A 09/14/2020   Procedure: VIDEO BRONCHOSCOPY WITH FLUORO;  Surgeon: Tyler Pita, MD;  Location: [facility] ORS;  Service: Cardiopulmonary;  Laterality: N/A;   VIDEO  BRONCHOSCOPY WITH ENDOBRONCHIAL ULTRASOUND N/A 09/14/2020   Procedure: VIDEO BRONCHOSCOPY WITH ENDOBRONCHIAL ULTRASOUND;  Surgeon: Tyler Pita, MD;  Location: [facility]S;  Service: Cardiopulmonary;  Laterality: N/A;    SOCIAL HISTORY: Social History   Socioeconomic History   Marital status: Single    Spouse name: Not on file   Number of children: Not on file   Years of education: Not on file   Highest education level: Not on file  Occupational History   Occupation: unemployed  Tobacco Use   Smoking status: Former    Packs/day: 2.00    Years: 38.00    Total pack years: 76.00    Types: Cigarettes    Quit date: 12/20/2014    Years since quitting: 7.4   Smokeless tobacco: Never   Tobacco comments:    quit  smoking in 2017 most smoked 2   Vaping Use   Vaping Use: Former  Substance and Sexual Activity   Alcohol use: Not Currently   Drug use: No   Sexual activity: Yes  Other Topics Concern   Not on file  Social History Narrative   Not on file   Social Determinants of Health   Financial Resource Strain: Not on file  Food Insecurity: Not on file  Transportation Needs: Not on file  Physical Activity: Not on file  Stress: Not on file  Social Connections: Not on file  Intimate Partner Violence: Not on file    FAMILY HISTORY: Family History  Problem Relation Age of Onset   Diabetes Mother    Hypertension Mother    Heart murmur Mother    Leukemia Mother    Throat cancer Maternal Grandmother    Colon cancer Neg Hx    Breast cancer Neg Hx     ALLERGIES:  has No Known Allergies.  MEDICATIONS:  Current Outpatient Medications  Medication Sig Dispense Refill   aspirin EC 81 MG tablet Take 81 mg by mouth daily. Swallow whole.     clopidogrel (PLAVIX) 75 MG tablet TAKE 1 TABLET BY MOUTH  DAILY 100 tablet 2   dexamethasone (DECADRON) 4 MG tablet Take 1 tablet (4 mg total) by mouth 2 (two) times daily. 14 tablet 0   dexamethasone (DECADRON) 4 MG tablet Take 1 tablet (4 mg total) by mouth daily. 30 tablet 0   Glycopyrrolate-Formoterol (BEVESPI AEROSPHERE) 9-4.8 MCG/ACT AERO Inhale 2 puffs into the lungs 2 (two) times daily. 32.1 g 1   levETIRAcetam (KEPPRA) 500 MG tablet Take 1 tablet (500 mg total) by mouth 2 (two) times daily. 180 tablet 3   levothyroxine (SYNTHROID) 100 MCG tablet TAKE 1 TABLET BY MOUTH ONCE DAILY BEFORE BREAKFAST 30 tablet 0   oxyCODONE (OXY IR/ROXICODONE) 5 MG immediate release tablet Take 5 mg by mouth every 4 (four) hours as needed.     simvastatin (ZOCOR) 40 MG tablet Take 40 mg by mouth at bedtime.     No current facility-administered medications for this visit.     PHYSICAL EXAMINATION: ECOG PERFORMANCE STATUS: 1 - Symptomatic but completely  ambulatory Vitals:   06/06/22 0825  BP: 117/74  Pulse: 79  Temp: 98.6 F (37 C)  SpO2: 99%   Filed Weights   06/06/22 0825  Weight: 202 lb 12.8 oz (92 kg)    Physical Exam Constitutional:      General: He is not in acute distress.    Appearance: He is not diaphoretic.     Comments: Patient walks with a cane  HENT:  Head: Normocephalic and atraumatic.     Nose: Nose normal.     Mouth/Throat:     Pharynx: No oropharyngeal exudate.  Eyes:     General: No scleral icterus.    Pupils: Pupils are equal, round, and reactive to light.  Cardiovascular:     Rate and Rhythm: Normal rate and regular rhythm.     Heart sounds: No murmur heard. Pulmonary:     Effort: Pulmonary effort is normal. No respiratory distress.     Breath sounds: No rales.  Chest:     Chest wall: No tenderness.  Abdominal:     General: There is no distension.     Palpations: Abdomen is soft.     Tenderness: There is no abdominal tenderness.  Musculoskeletal:     Cervical back: Normal range of motion and neck supple.     Comments: Left upper extremity in sling.  + left hand swelling.  +decreased right lower extremity strength, normal right upper and left lower extremity strength.   Skin:    General: Skin is warm and dry.     Findings: No erythema.  Neurological:     Mental Status: He is alert and oriented to person, place, and time.     Cranial Nerves: No cranial nerve deficit.     Motor: No abnormal muscle tone.     Coordination: Coordination normal.  Psychiatric:        Mood and Affect: Affect normal.      LABORATORY DATA:  I have reviewed the data as listed    Latest Ref Rng & Units 06/06/2022    8:15 AM 05/23/2022    8:30 AM 05/09/2022    7:57 AM  CBC  WBC 4.0 - 10.5 K/uL 4.4  4.8  4.3   Hemoglobin 13.0 - 17.0 g/dL 12.6  11.3  12.4   Hematocrit 39.0 - 52.0 % 39.0  35.5  38.6   Platelets 150 - 400 K/uL 273  219  209       Latest Ref Rng & Units 06/06/2022    8:15 AM 05/23/2022     8:30 AM 05/09/2022    7:57 AM  CMP  Glucose 70 - 99 mg/dL 137  113  163   BUN 8 - 23 mg/dL _0 Creatinine 0.61 - 1.24 mg/dL 1.04  0.83  0.96   Sodium 135 - 145 mmol/L 139  137  141   Potassium 3.5 - 5.1 mmol/L 3.6  3.5  3.4   Chloride 98 - 111 mmol/L 106  103  110   CO2 22 - 32 mmol/L _1 Calcium 8.9 - 10.3 mg/dL 9.3  8.8  8.8   Total Protein 6.5 - 8.1 g/dL 7.1  6.6  6.9   Total Bilirubin 0.3 - 1.2 mg/dL 0.4  0.6  0.3   Alkaline Phos 38 - 126 U/L 77  63  66   AST 15 - 41 U/L _2 ALT 0 - 44 U/L _3 RADIOGRAPHIC STUDIES: I have personally reviewed the radiological images as listed and agreed with the findings in the report. CT CHEST ABDOMEN PELVIS W CONTRAST  Result Date: 05/30/2022 CLINICAL DATA:  66 year old male with history of squamous cell carcinoma of the lung status post radiation therapy and chemotherapy, restarted immunotherapy. Follow-up study. * Tracking Code: BO * EXAM: CT CHEST, ABDOMEN, AND PELVIS WITH  CONTRAST TECHNIQUE: Multidetector CT imaging of the chest, abdomen and pelvis was performed following the standard protocol during bolus administration of intravenous contrast. RADIATION DOSE REDUCTION: This exam was performed according to the departmental dose-optimization program which includes automated exposure control, adjustment of the mA and/or kV according to patient size and/or use of iterative reconstruction technique. CONTRAST:  153m OMNIPAQUE IOHEXOL 300 MG/ML  SOLN COMPARISON:  Chest CT 04/04/2022. CT of the chest, abdomen and pelvis 01/17/2022. FINDINGS: CT CHEST FINDINGS Cardiovascular: Heart size is normal. There is no significant pericardial fluid, thickening or pericardial calcification. There is aortic atherosclerosis, as well as atherosclerosis of the great vessels of the mediastinum and the coronary arteries, including calcified atherosclerotic plaque in the left anterior descending and right coronary arteries.  Mediastinum/Nodes: No pathologically enlarged mediastinal or hilar lymph nodes. Esophagus is unremarkable in appearance. No axillary lymphadenopathy. Lungs/Pleura: Chronic mass-like architectural distortion and volume loss is noted in the perihilar aspect of the right mid to upper lung, similar to prior examinations, most compatible with areas of chronic postradiation mass-like fibrosis. The surrounding regions of ground-glass attenuation and septal thickening seen on prior examinations persist, but have decreased compared to the prior study, most likely to reflect resolving postradiation pneumonitis. Trace right pleural effusion lying dependently, similar to the prior study. No left pleural effusion. No new acute consolidative airspace disease. No definite suspicious appearing pulmonary nodules or masses are noted. Musculoskeletal: Orthopedic fixation hardware in the lower cervical spine incompletely imaged. There are no aggressive appearing lytic or blastic lesions noted in the visualized portions of the skeleton. CT ABDOMEN PELVIS FINDINGS Hepatobiliary: Diffuse low attenuation throughout the hepatic parenchyma, indicative of a background of hepatic steatosis. No suspicious cystic or solid hepatic lesions. No intra or extrahepatic biliary ductal dilatation. Gallbladder is normal in appearance. Pancreas: No pancreatic mass. No pancreatic ductal dilatation. No pancreatic or peripancreatic fluid collections or inflammatory changes. Spleen: Unremarkable. Adrenals/Urinary Tract: Bilateral kidneys and bilateral adrenal glands are normal in appearance. No hydroureteronephrosis. Urinary bladder is unremarkable in appearance. Stomach/Bowel: The appearance of the stomach is normal. No pathologic dilatation of small bowel or colon. Numerous colonic diverticuli are noted, particularly in the sigmoid colon, without surrounding inflammatory changes to suggest an acute diverticulitis at this time. Normal appendix.  Vascular/Lymphatic: Atherosclerosis throughout the abdominal aorta and pelvic vasculature. Postoperative changes in the upper right thigh incompletely imaged, with what appears to be a vascular graft bifurcating in the anterior aspect of the upper right thigh, with both limbs of the graft apparently occluded on today's examination. Reproductive: Prostate gland and seminal vesicles are unremarkable in appearance. Other: No significant volume of ascites.  No pneumoperitoneum. Musculoskeletal: Status post PLIF at L5-S1. There are no aggressive appearing lytic or blastic lesions noted in the visualized portions of the skeleton. IMPRESSION: 1. 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 There is a stable small right pleural effusion, but no definitive findings to suggest residual or recurrent disease on today's examination. 2. No signs of metastatic disease in the abdomen or pelvis. 3. Aortic atherosclerosis, in addition to two-vessel coronary artery disease. Please note that although the presence of coronary artery calcium documents the presence of coronary artery disease, the severity of this disease and any potential stenosis cannot be assessed on this non-gated CT examination. Assessment for potential risk factor modification, dietary therapy or pharmacologic therapy may be warranted, if clinically indicated. 4. Hepatic steatosis. 5. Colonic diverticulosis without evidence  of acute diverticulitis at this time. 6. Incompletely imaged but apparently occluded vascular graft in the superior aspect of the right anterior thigh. Referral to vascular specialist for further clinical evaluation should be considered if clinically appropriate. 7. Additional incidental findings, as above. Electronically Signed   By: Vinnie Langton M.D.   On: 05/30/2022 08:04   MR BRAIN W WO CONTRAST  Result Date:  05/27/2022 CLINICAL DATA:  66 year old male with metastatic lung cancer. Three known brain metastases. Status post brain radiation, reportedly SRS. Restaging. EXAM: MRI HEAD WITHOUT AND WITH CONTRAST TECHNIQUE: Multiplanar, multiecho pulse sequences of the brain and surrounding structures were obtained without and with intravenous contrast. CONTRAST:  34m GADAVIST GADOBUTROL 1 MMOL/ML IV SOLN COMPARISON:  Brain MRI 02/19/2022 and earlier. FINDINGS: Brain: Larger rim enhancing metastasis of the left parietal lobe since August, now 10 mm diameter (3 mm previously. See series 18, image 144. Regional FLAIR hyperintensity is mild but also progressed. Dominant left frontal operculum metastasis has also increased in size and heterogeneously enhancing on series 18, image 104 up to 21 mm diameter now (previously 6 mm). Significant progression of confluent surrounding T2 and FLAIR hyperintensity compatible with vasogenic edema. Much of the enhancing mass is seen to be T2 hypointense on series 10, image 17 and there are some diffusion signal changes. Only minor regional mass effect including on the left lateral ventricle. Progression also of the small right occipital lobe white matter metastasis now 10 mm diameter (previously 4 mm) and increased, moderate regional T2 and FLAIR hyperintense vasogenic edema (series 15, image 21). No significant regional mass effect. New small 4-5 mm solid enhancing left anterior cerebellar metastasis on series 18, image 34. Mild regional T2 and FLAIR hyperintense edema without mass effect (series 15, image 10). This lesion might have been punctate on 056/43/3295 uncertain. No other enhancing metastasis identified. No superimposed restricted diffusion suggestive of acute infarction. No midline shift, ventriculomegaly, extra-axial collection or acute intracranial hemorrhage. Cervicomedullary junction and pituitary are within normal limits. Widely scattered and patchy additional bilateral cerebral  white matter nonspecific T2 and FLAIR hyperintensity appears stable since May. Vascular: Major intracranial vascular flow voids are stable. Dominant distal left vertebral artery. Major dural venous sinuses are enhancing and appear to be patent. Skull and upper cervical spine: Visualized bone marrow signal is within normal limits. Negative visible cervical spine. Sinuses/Orbits: Stable and negative. Other: Visible internal auditory structures appear normal. Negative visible scalp and face. IMPRESSION: 1. All three known brain metastases have enlarged since August, with increasing regional edema. And there is a small new left anterior cerebellar 4-5 mm metastasis (was questionably punctate on the May MRI). This constellation could reflect a combination of True Progression (Left Cerebellar lesion) and pseudoprogression/radionecrosis (3 existing mets). 2. No significant intracranial mass effect. 3. Underlying moderate cerebral white matter signal changes. Electronically Signed   By: HGenevie AnnM.D.   On: 05/27/2022 05:45   CT ELBOW LEFT WO CONTRAST  Result Date: 05/21/2022 CLINICAL DATA:  Fall, follow-up left elbow x-rays EXAM: CT OF THE UPPER LEFT EXTREMITY WITHOUT CONTRAST TECHNIQUE: Multidetector CT imaging of the upper left extremity was performed according to the standard protocol. RADIATION DOSE REDUCTION: This exam was performed according to the departmental dose-optimization program which includes automated exposure control, adjustment of the mA and/or kV according to patient size and/or use of iterative reconstruction technique. COMPARISON:  Left elbow radiograph 05/14/2022 FINDINGS: Bones/Joint/Cartilage There is a minimally displaced supracondylar fracture of the humerus with associated moderate-sized joint effusion. There  is up to 4 mm cortical offset at the medial margin. No condylar split component. Ligaments Suboptimally assessed by CT. Muscles and Tendons No acute myotendinous abnormality or  intramuscular collection on noncontrast CT. Soft tissues There is soft tissue swelling along the elbow. IMPRESSION: Minimally displaced supracondylar fracture of the distal humerus. Moderate-sized joint effusion. Electronically Signed   By: Maurine Simmering M.D.   On: 05/21/2022 13:26   DG Elbow Complete Left  Result Date: 05/14/2022 CLINICAL DATA:  [place] today with elbow pain EXAM: LEFT ELBOW - COMPLETE 3+ VIEW COMPARISON:  None Available. FINDINGS: Supracondylar fracture of the humerus without angulation or displacement. Joint effusion is present. No fracture of the radius or ulna. IMPRESSION: Supracondylar fracture of the humerus without angulation or displacement. Electronically Signed   By: Nelson Chimes M.D.   On: 05/14/2022 14:32   VAS [place] ABI WITH/WO TBI  Result Date: 05/12/2022  LOWER EXTREMITY DOPPLER STUDY Patient Name:  Joshua [name]  Date of Exam:   05/05/2022 Medical Rec #: [id number]            Accession #:    [id number] Date of Birth: 1956-03-15           Patient Gender: M Patient Age:   9 years Exam Location:   [facility] Procedure:      VAS [place] ABI WITH/WO TBI Referring Phys: --------------------------------------------------------------------------------  Indications: Claudication, peripheral artery disease, and Routine f/u. High Risk Factors: Hyperlipidemia. Other Factors: ASO s/p multiple interventions and bypass.                11/15/2019 Right thrombectomy of Fempop insitu graft with added                stent throughout.                 12/01/2019: Aortogram and Selctive Right Lower Extremity                Angiogram. 6 mg of TPA delivered in a lysis catheterfrom the                Origin of the Right Femoral -Popliteal bypass down the proximal                ATA with placement of the lysis.                 12/02/2019: PTA of the Right Posterior Tibial Artery. PTA of the                Right Peroneal Artery. Mechanical Thrombectomy of the Right                Femoral  -Popliteal Bypass graft. Mechanical Thrombectomy of the                Right Tibioperoneal Trunk and Posterior Tibial Artery.                 09/25/2021: Right Lower Extremity Angiography third order                catheter placement. PTA of the proximal peroneal and                tibioperoneal trunk to 2.5 mm with an ultra score balloon. PTA of                the Femoral Artery and the proximal anstomosis of the bypass  graft to 4 mm with an ultra score balloon. Administration of 5 mg                of tpa throughout the entire bypass and in the peroneal Artery.                Initiation of continuous tpa infusion Right lower Extremity for                limb salvage. Insertion of a triple lumen catheter Left CFV with                [place] and fluoroscopic guidance.                 09/26/2021: Right LE Angiogram. PTA of the Right Posterior Tibial                Artery with 2.5 mm diameter by 30 cm length angioplasty balloon.                PTA of the distal bypass anastomosis and distal bypass graft                analogous to the Popliteal artery with 4 mm diameter by 8 cm                length Lutonix drug coated angioplasty balloon.                04/08/2022 right thrombectomy of the Deep profunda.  Vascular Interventions: Hx of multiple right lower extremity PTA's/stents.                         09/10/2016 Right femoral to below the knee popliteal                         artery bypass graft placement. 5/01 Right bypass graft &                         mid popliteal artery PTA & coil embolectomy of large                         branch of bypass graft. 12/09/16 Right distal bypass                         graft PTA/stent and right posterior tibial artery PTA.                         03/23/2020 Rt PTA thrombectomy. Rt Profunda fem artery to                         PTA bypass. Comparison Study: 03/10/2022 Performing Technologist: Concha Norway RVT  Examination Guidelines: A complete evaluation includes at  minimum, Doppler waveform signals and systolic blood pressure reading at the level of bilateral brachial, anterior tibial, and posterior tibial arteries, when vessel segments are accessible. Bilateral testing is considered an integral part of a complete examination. Photoelectric Plethysmograph (PPG) waveforms and toe systolic pressure readings are included as required and additional duplex testing as needed. Limited examinations for reoccurring indications may be performed as noted.  ABI Findings: +---------+------------------+-----+----------+--------+ Right    Rt Pressure (mmHg)IndexWaveform  Comment  +---------+------------------+-----+----------+--------+ Brachial 135                                       +---------+------------------+-----+----------+--------+  ATA                             absent             +---------+------------------+-----+----------+--------+ PTA      72                0.53 monophasic         +---------+------------------+-----+----------+--------+ PERO     88                0.65 monophasic         +---------+------------------+-----+----------+--------+ Great Toe36                0.27 Abnormal           +---------+------------------+-----+----------+--------+ +---------+------------------+-----+---------+-------+ Left     Lt Pressure (mmHg)IndexWaveform Comment +---------+------------------+-----+---------+-------+ Brachial 135                                     +---------+------------------+-----+---------+-------+ ATA      160               1.19 triphasic        +---------+------------------+-----+---------+-------+ PTA      146               1.08 triphasic        +---------+------------------+-----+---------+-------+ Great Toe155               1.15 Normal           +---------+------------------+-----+---------+-------+ +-------+-----------+-----------+------------+------------+ ABI/TBIToday's ABIToday's  TBIPrevious ABIPrevious TBI +-------+-----------+-----------+------------+------------+ Right  .65        .27        .28         .00          +-------+-----------+-----------+------------+------------+ Left   1.19       1.15       1.21        .96          +-------+-----------+-----------+------------+------------+ Right ABIs and TBIs appear increased compared to prior study on 02/2022.  Summary: Right: Resting right ankle-brachial index indicates moderate right lower extremity arterial disease. The right toe-brachial index is abnormal. Left: Resting left ankle-brachial index is within normal range. The left toe-brachial index is normal. *See table(s) above for measurements and observations.  Electronically signed by [name] MD on 05/12/2022 at 1:04:36 PM.    Final    PERIPHERAL VASCULAR CATHETERIZATION  Result Date: 04/08/2022 See surgical note for result.  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 Chemotherapy, radiation and immunotherapy completed. * Tracking Code: BO * EXAM: CT CHEST WITH CONTRAST TECHNIQUE: Multidetector CT imaging of the chest was performed during intravenous contrast administration. RADIATION DOSE REDUCTION: This exam was performed according to the departmental dose-optimization program which includes automated exposure control, adjustment of the mA and/or kV according to patient size and/or use of iterative reconstruction technique. CONTRAST:  58m OMNIPAQUE IOHEXOL 300 MG/ML  SOLN COMPARISON:  Multiple priors, including CT 01/17/2022 and PET-CT 10/16/2021. FINDINGS: Cardiovascular: Right IJ Port-A-Cath extends to the superior cavoatrial junction. No acute vascular findings. Minimal aortic and coronary artery atherosclerosis. The heart size is normal. There is no pericardial effusion. Mediastinum/Nodes: There are no enlarged mediastinal, hilar or  axillary lymph nodes.Stable right perihilar soft  tissue thickening. No definite thyroid tissue identified. The esophagus and trachea appear unremarkable. Lungs/Pleura: Trace right pleural effusion. No pneumothorax. There are stable radiation changes in the right perihilar region with architectural distortion and traction bronchiectasis. No recurrent mass lesion identified. The patchy ground-glass opacity seen on the most recent examination have improved. No suspicious pulmonary nodules. Upper abdomen: Mild hepatic steatosis. The visualized upper abdomen otherwise appears unremarkable. Musculoskeletal/Chest wall: There is no chest wall mass or suspicious osseous finding. Old left-sided rib fractures. IMPRESSION: 1. Stable radiation changes in the right perihilar region without evidence of local recurrence. 2. Interval improvement in the patchy ground-glass opacities previously demonstrated in both lungs, most consistent with a resolving infectious or inflammatory process (including immunotherapy-related pneumonitis). 3. No evidence of metastatic disease. Electronically Signed   By: Richardean Sale M.D.   On: 04/06/2022 11:53   VAS [place] LOWER EXTREMITY ARTERIAL DUPLEX  Result Date: 03/10/2022 LOWER EXTREMITY ARTERIAL DUPLEX STUDY Patient Name:  REDMOND WHITTLEY  Date of Exam:   03/10/2022 Medical Rec #: [id number]            Accession #:    [id number] Date of Birth: [birth year]           Patient Gender: M Patient Age:   32 years Exam Location:  [facility] Procedure:      VAS [place] LOWER EXTREMITY ARTERIAL DUPLEX Referring Phys: [name] --------------------------------------------------------------------------------  Indications: Claudication, peripheral artery disease, and pain post workout. High Risk Factors: Past history of smoking. Other Factors: ASO s/p multiple interventions and bypass.                11/15/2019 Right thrombectomy of Fempop insitu graft with added                stent  throughout.                 12/01/2019: Aortogram and Selective Right Lower Extremity                Angiogram. 6 mg of TPA delivered in a lysis catheter from the                Origin of the Right Femoral -Popliteal bypass down the proximal                ATA with placement of the lysis.                 12/02/2019: PTA of the Right Posterior Tibial Artery. PTA of the                Right Peroneal Artery. Mechanical Thrombectomy of the Right                Femoral -Popliteal Bypass graft. Mechanical Thrombectomy of the                Right Tibioperoneal Trunk and Posterior Tibial Artery.                 09/25/2021: Right Lower Extremity Angiography third order                catheter placement. PTA of the proximal peroneal and                tibioperoneal trunk to 2.5 mm with an ultra score balloon. PTA of                the Femoral Artery and the proximal anastomosis of the bypass  graft to 4 mm with an ultra score balloon. Administration of 5 mg                of tpa throughout the entire bypass and in the peroneal Artery.                Initiation of continuous tpa infusion Right lower Extremity for                limb salvage. Insertion of a triple lumen catheter Left CFV with                [place] and fluoroscopic guidance.                 09/26/2021: Right LE Angiogram. PTA of the Right Posterior Tibial                Artery with 2.5 mm diameter by 30 cm length angioplasty balloon.                PTA of the distal bypass anastomosis and distal bypass graft                analogous to the Popliteal artery with 4 mm diameter by 8 cm                length Lutonix drug coated angioplasty balloon.  Vascular Interventions: Hx of multiple right lower extremity PTA's/stents.                         09/10/2016 Right femoral to below the knee popliteal                         artery bypass graft placement. 5/01 Right bypass graft &                         mid popliteal artery PTA & coil embolectomy of large                          branch of bypass graft. 12/09/16 Right distal bypass                         graft PTA/stent and right posterior tibial artery PTA.                         03/23/2020 Rt PTA thrombectomy. Rt Profunda fem artery to                         PTA bypass. Current ABI:            Right: 0.28, Left: 1.21 Performing Technologist: Delorise Shiner RVT  Examination Guidelines: A complete evaluation includes B-mode imaging, spectral Doppler, color Doppler, and power Doppler as needed of all accessible portions of each vessel. Bilateral testing is considered an integral part of a complete examination. Limited examinations for reoccurring indications may be performed as noted.  +----------+--------+-----+--------+----------+--------+ RIGHT     PSV cm/sRatioStenosisWaveform  Comments +----------+--------+-----+--------+----------+--------+ CFA Distal76                   triphasic          +----------+--------+-----+--------+----------+--------+ DFA       47  biphasic           +----------+--------+-----+--------+----------+--------+ SFA Prox  0                                       +----------+--------+-----+--------+----------+--------+ SFA Mid   0                                       +----------+--------+-----+--------+----------+--------+ SFA Distal0                                       +----------+--------+-----+--------+----------+--------+ POP Prox  35                   monophasic         +----------+--------+-----+--------+----------+--------+ PTA Distal12                   monophasic         +----------+--------+-----+--------+----------+--------+  Right Graft #1: Fem- pop +------------------+--------+--------+----------+--------+                   PSV cm/sStenosisWaveform  Comments +------------------+--------+--------+----------+--------+ Inflow            76              monophasic          +------------------+--------+--------+----------+--------+ Prox Anastomosis  0                                  +------------------+--------+--------+----------+--------+ Proximal Graft    0                                  +------------------+--------+--------+----------+--------+ Mid Graft         0                                  +------------------+--------+--------+----------+--------+ Distal Graft      0                                  +------------------+--------+--------+----------+--------+ Distal Anastomosis0                                  +------------------+--------+--------+----------+--------+ Outflow           35              monophasic         +------------------+--------+--------+----------+--------+   Summary: Right: Total occlusion noted in the superficial femoral artery. Occluded right fem-pop bypass graft.  See table(s) above for measurements and observations. Electronically signed by [name] MD on 03/10/2022 at 69:05:38 PM.    Final    VAS [place] ABI WITH/WO TBI  Result Date: 03/10/2022  LOWER EXTREMITY DOPPLER STUDY Patient Name:  Joshua [name]  Date of Exam:   03/10/2022 Medical Rec #: [id number]            Accession #:    [id number] Date of Birth: [birth year]  Patient Gender: M Patient Age:   41 years Exam Location:  [facility] Procedure:      VAS [place] ABI WITH/WO TBI Referring Phys: GREGORY SCHNIER --------------------------------------------------------------------------------  Indications: Claudication, peripheral artery disease, and Routine f/u. High Risk Factors: Hyperlipidemia, past history of smoking. Other Factors: ASO s/p multiple interventions and bypass.                11/15/2019 Right thrombectomy of Fempop insitu graft with added                stent throughout.                 12/01/2019: Aortogram and Selective Right Lower Extremity                Angiogram. 6 mg of TPA delivered in a lysis catheter from the                 Origin of the Right Femoral -Popliteal bypass down the proximal                ATA with placement of the lysis.                 12/02/2019: PTA of the Right Posterior Tibial Artery. PTA of the                Right Peroneal Artery. Mechanical Thrombectomy of the Right                Femoral -Popliteal Bypass graft. Mechanical Thrombectomy of the                Right Tibioperoneal Trunk and Posterior Tibial Artery.                 09/25/2021: Right Lower Extremity Angiography third order                catheter placement. PTA of the proximal peroneal and                tibioperoneal trunk to 2.5 mm with an ultra score balloon. PTA of                the Femoral Artery and the proximal anastomosis of the bypass                graft to 4 mm with an ultra score balloon. Administration of 5 mg                of tpa throughout the entire bypass and in the peroneal Artery.                Initiation of continuous tpa infusion Right lower Extremity for                limb salvage. Insertion of a triple lumen catheter Left CFV with                [place] and fluoroscopic guidance.                 09/26/2021: Right LE Angiogram. PTA of the Right Posterior Tibial                Artery with 2.5 mm diameter by 30 cm length angioplasty balloon.                PTA of the distal bypass anastomosis and distal bypass graft  analogous to the Popliteal artery with 4 mm diameter by 8 cm                length Lutonix drug coated angioplasty balloon.  Vascular Interventions: Hx of multiple right lower extremity PTA's/stents.                         09/10/2016 Right femoral to below the knee popliteal                         artery bypass graft placement. 5/01 Right bypass graft &                         mid popliteal artery PTA & coil embolectomy of large                         branch of bypass graft. 12/09/16 Right distal bypass                         graft PTA/stent and right posterior tibial artery PTA.                          03/23/2020 Rt PTA thrombectomy. Rt Profunda fem artery to                         PTA bypass. Performing Technologist: Delorise Shiner RVT  Examination Guidelines: A complete evaluation includes at minimum, Doppler waveform signals and systolic blood pressure reading at the level of bilateral brachial, anterior tibial, and posterior tibial arteries, when vessel segments are accessible. Bilateral testing is considered an integral part of a complete examination. Photoelectric Plethysmograph (PPG) waveforms and toe systolic pressure readings are included as required and additional duplex testing as needed. Limited examinations for reoccurring indications may be performed as noted.  ABI Findings: +---------+------------------+-----+----------+--------+ Right    Rt Pressure (mmHg)IndexWaveform  Comment  +---------+------------------+-----+----------+--------+ Brachial 147                                       +---------+------------------+-----+----------+--------+ ATA      33                0.22 monophasic         +---------+------------------+-----+----------+--------+ PTA      42                0.28 monophasic         +---------+------------------+-----+----------+--------+ Great Toe0                 0.00                    +---------+------------------+-----+----------+--------+ +---------+------------------+-----+---------+-------+ Left     Lt Pressure (mmHg)IndexWaveform Comment +---------+------------------+-----+---------+-------+ Brachial 152                                     +---------+------------------+-----+---------+-------+ ATA      184               1.21 triphasic        +---------+------------------+-----+---------+-------+ PTA      180  1.18 triphasic        +---------+------------------+-----+---------+-------+ Great Toe146               0.96                  +---------+------------------+-----+---------+-------+  +-------+-----------+-----------+------------+------------+ ABI/TBIToday's ABIToday's TBIPrevious ABIPrevious TBI +-------+-----------+-----------+------------+------------+ Right  0.28       0.00       1.06        0.97         +-------+-----------+-----------+------------+------------+ Left   1.21       0.96       1.33        1.25         +-------+-----------+-----------+------------+------------+ Right ABIs appear decreased compared to prior study on 10/23/2021. Left ABIs appear essentially unchanged compared to prior study on 10/23/2021.  Summary: Right: Resting right ankle-brachial index indicates critical limb ischemia. The right toe-brachial index is abnormal. Left: Resting left ankle-brachial index is within normal range. The left toe-brachial index is normal. *See table(s) above for measurements and observations.  Electronically signed by [name] MD on 03/10/2022 at 6:05:29 PM.    Final

## 2022-06-06 NOTE — Patient Instructions (Signed)
[facility] [place]  Discharge Instructions: Thank you for choosing [facility] to provide your oncology and hematology care.  If you have a lab appointment with the [facility], please go directly to the [facility] and check in at the registration area.  Wear comfortable clothing and clothing appropriate for easy access to any Portacath or PICC line.   We strive to give you quality time with your provider. You may need to reschedule your appointment if you arrive late (15 or more minutes).  Arriving late affects you and other patients whose appointments are after yours.  Also, if you miss three or more appointments without notifying the office, you may be dismissed from the clinic at the provider's discretion.      For prescription refill requests, have your pharmacy contact our office and allow 72 hours for refills to be completed.    Today you received the following chemotherapy and/or immunotherapy agents Durvalumab       To help prevent nausea and vomiting after your treatment, we encourage you to take your nausea medication as directed.  BELOW ARE SYMPTOMS THAT SHOULD BE REPORTED IMMEDIATELY: *FEVER GREATER THAN 100.4 F (38 C) OR HIGHER *CHILLS OR SWEATING *NAUSEA AND VOMITING THAT IS NOT CONTROLLED WITH YOUR NAUSEA MEDICATION *UNUSUAL SHORTNESS OF BREATH *UNUSUAL BRUISING OR BLEEDING *URINARY PROBLEMS (pain or burning when urinating, or frequent urination) *BOWEL PROBLEMS (unusual diarrhea, constipation, pain near the anus) TENDERNESS IN MOUTH AND THROAT WITH OR WITHOUT PRESENCE OF ULCERS (sore throat, sores in mouth, or a toothache) UNUSUAL RASH, SWELLING OR PAIN  UNUSUAL VAGINAL DISCHARGE OR ITCHING   Items with * indicate a potential emergency and should be followed up as soon as possible or go to the Emergency Department if any problems should occur.  Please show the CHEMOTHERAPY ALERT CARD or IMMUNOTHERAPY ALERT CARD at check-in to  the Emergency Department and triage nurse.  Should you have questions after your visit or need to cancel or reschedule your appointment, please contact [facility] [place] [facility] [place]  [phone] and follow the prompts.  Office hours are 8:00 a.m. to 4:30 p.m. Monday - Friday. Please note that voicemails left after 4:00 p.m. may not be returned until the following business day.  We are closed weekends and major holidays. You have access to a nurse at all times for urgent questions. Please call the main number to the clinic [phone] and follow the prompts.  For any non-urgent questions, you may also contact your provider using MyChart. We now offer e-Visits for anyone 68 and older to request care online for non-urgent symptoms. For details visit [email or web address].   Also download the MyChart app! Go to the app store, search "MyChart", open the app, select [facility], and log in with your MyChart username and password.  Masks are optional in the cancer centers. If you would like for your care team to wear a mask while they are taking care of you, please let them know. For doctor visits, patients may have with them one support person who is at least 64 years old. At this time, visitors are not allowed in the infusion area.

## 2022-06-06 NOTE — Assessment & Plan Note (Addendum)
Status post radiation.  02/19/22 MRI showed decreased brain lesion size and edema.  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 Enlarged 3 brain lesions and surrounding edema changes are more consistent with reactive changes due to previous radiation. I will start patient on Dexamethasone 4mg  BID x 1 week followed by Dexamethasone 4mg  daily. Rx were sent.  He has an appointment with Dr.Vaslow on 12/1  The small left anterior cerebellar lesion is likely a new metastatic lesion, Dr.Vasclow recommend radiation. I sent a inbasket message to Dr.Chrystal.

## 2022-06-06 NOTE — Assessment & Plan Note (Signed)
Synthroid 100 mcg daily

## 2022-06-06 NOTE — Assessment & Plan Note (Addendum)
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  Repeat CT scan showed improvement of patchy ground-glass opacities.  Reviewed and discussed with patient CT chest abdomen pelvis showed no progression.  Labs reviewed and discussed with patient. Proceed with Durvalumab  Check bone scan

## 2022-06-10 ENCOUNTER — Encounter
Admission: RE | Admit: 2022-06-10 | Discharge: 2022-06-10 | Disposition: A | Discharge status: Home / Self Care | Payer: Medicare Other | Source: Ambulatory Visit | Attending: Oncology | Admitting: Oncology

## 2022-06-10 DIAGNOSIS — C3491 Malignant neoplasm of unspecified part of right bronchus or lung: Secondary | ICD-10-CM | POA: Insufficient documentation

## 2022-06-10 MED ORDER — TECHNETIUM TC 99M MEDRONATE IV KIT
20.0000 | PACK | Freq: Once | INTRAVENOUS | Status: AC | PRN
  Administered 2022-06-10: 20.51 via INTRAVENOUS

## 2022-06-20 ENCOUNTER — Encounter: Payer: Self-pay | Admitting: Oncology

## 2022-06-20 ENCOUNTER — Encounter: Payer: Self-pay | Admitting: Internal Medicine

## 2022-06-20 ENCOUNTER — Inpatient Hospital Stay: Payer: Medicare Other

## 2022-06-20 ENCOUNTER — Inpatient Hospital Stay (HOSPITAL_BASED_OUTPATIENT_CLINIC_OR_DEPARTMENT_OTHER): Payer: Medicare Other | Admitting: Internal Medicine

## 2022-06-20 ENCOUNTER — Inpatient Hospital Stay: Payer: Medicare Other | Attending: Oncology

## 2022-06-20 ENCOUNTER — Inpatient Hospital Stay (HOSPITAL_BASED_OUTPATIENT_CLINIC_OR_DEPARTMENT_OTHER): Payer: Medicare Other | Admitting: Oncology

## 2022-06-20 VITALS — BP 154/86 | HR 82 | Temp 98.0°F | Resp 18 | Wt 209.0 lb

## 2022-06-20 VITALS — BP 154/86 | HR 82 | Resp 18 | Ht 71.0 in | Wt 209.0 lb

## 2022-06-20 DIAGNOSIS — C7931 Secondary malignant neoplasm of brain: Secondary | ICD-10-CM | POA: Insufficient documentation

## 2022-06-20 DIAGNOSIS — C3491 Malignant neoplasm of unspecified part of right bronchus or lung: Secondary | ICD-10-CM

## 2022-06-20 DIAGNOSIS — Z5112 Encounter for antineoplastic immunotherapy: Secondary | ICD-10-CM | POA: Diagnosis present

## 2022-06-20 DIAGNOSIS — C3411 Malignant neoplasm of upper lobe, right bronchus or lung: Secondary | ICD-10-CM | POA: Insufficient documentation

## 2022-06-20 DIAGNOSIS — E032 Hypothyroidism due to medicaments and other exogenous substances: Secondary | ICD-10-CM | POA: Insufficient documentation

## 2022-06-20 DIAGNOSIS — I739 Peripheral vascular disease, unspecified: Secondary | ICD-10-CM | POA: Insufficient documentation

## 2022-06-20 DIAGNOSIS — Z79899 Other long term (current) drug therapy: Secondary | ICD-10-CM | POA: Diagnosis not present

## 2022-06-20 LAB — COMPREHENSIVE METABOLIC PANEL
ALT: 18 U/L (ref 0–44)
AST: 22 U/L (ref 15–41)
Albumin: 3.8 g/dL (ref 3.5–5.0)
Alkaline Phosphatase: 65 U/L (ref 38–126)
Anion gap: 11 (ref 5–15)
BUN: 30 mg/dL — ABNORMAL HIGH (ref 8–23)
CO2: 24 mmol/L (ref 22–32)
Calcium: 8.9 mg/dL (ref 8.9–10.3)
Chloride: 103 mmol/L (ref 98–111)
Creatinine, Ser: 0.96 mg/dL (ref 0.61–1.24)
GFR, Estimated: >60 mL/min (ref >60)
Glucose, Bld: 180 mg/dL — ABNORMAL HIGH (ref 70–99)
Potassium: 3.7 mmol/L (ref 3.5–5.1)
Sodium: 138 mmol/L (ref 135–145)
Total Bilirubin: 0.4 mg/dL (ref 0.3–1.2)
Total Protein: 6.5 g/dL (ref 6.5–8.1)

## 2022-06-20 LAB — T4, FREE: Free T4: 0.77 ng/dL (ref 0.61–1.12)

## 2022-06-20 LAB — CBC WITH DIFFERENTIAL/PLATELET
Abs Immature Granulocytes: 0.06 10*3/uL (ref 0.00–0.07)
Basophils Absolute: 0 10*3/uL (ref 0.0–0.1)
Basophils Relative: 0 %
Eosinophils Absolute: 0 10*3/uL (ref 0.0–0.5)
Eosinophils Relative: 0 %
HCT: 39.7 % (ref 39.0–52.0)
Hemoglobin: 12.8 g/dL — ABNORMAL LOW (ref 13.0–17.0)
Immature Granulocytes: 1 %
Lymphocytes Relative: 5 %
Lymphs Abs: 0.5 10*3/uL — ABNORMAL LOW (ref 0.7–4.0)
MCH: 27.5 pg (ref 26.0–34.0)
MCHC: 32.2 g/dL (ref 30.0–36.0)
MCV: 85.4 fL (ref 80.0–100.0)
Monocytes Absolute: 0.4 10*3/uL (ref 0.1–1.0)
Monocytes Relative: 3 %
Neutro Abs: 10.3 10*3/uL — ABNORMAL HIGH (ref 1.7–7.7)
Neutrophils Relative %: 91 %
Platelets: 217 10*3/uL (ref 150–400)
RBC: 4.65 MIL/uL (ref 4.22–5.81)
RDW: 13.9 % (ref 11.5–15.5)
WBC: 11.2 10*3/uL — ABNORMAL HIGH (ref 4.0–10.5)
nRBC: 0 % (ref 0.0–0.2)

## 2022-06-20 LAB — TSH: TSH: 8.041 u[IU]/mL — ABNORMAL HIGH (ref 0.350–4.500)

## 2022-06-20 MED ORDER — HEPARIN SOD (PORK) LOCK FLUSH 100 UNIT/ML IV SOLN
500.0000 [IU] | Freq: Once | INTRAVENOUS | Status: AC | PRN
  Administered 2022-06-20: 500 [IU]
  Filled 2022-06-20: qty 5

## 2022-06-20 MED ORDER — DEXAMETHASONE 4 MG PO TABS: 4.0000 mg | ORAL_TABLET | Freq: Every day | ORAL | 0 refills | Status: DC

## 2022-06-20 MED ORDER — SODIUM CHLORIDE 0.9 % IV SOLN
Freq: Once | INTRAVENOUS | Status: AC
  Filled 2022-06-20: qty 250

## 2022-06-20 MED ORDER — SODIUM CHLORIDE 0.9 % IV SOLN
10.0000 mg/kg | Freq: Once | INTRAVENOUS | Status: AC
  Administered 2022-06-20: 1000 mg via INTRAVENOUS
  Filled 2022-06-20: qty 20

## 2022-06-20 NOTE — Progress Notes (Unsigned)
Patient here for oncology follow-up appointment, expresses no new complaints or concerns at this time.   

## 2022-06-20 NOTE — Assessment & Plan Note (Signed)
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  Repeat CT scan showed improvement of patchy ground-glass opacities.  Reviewed and discussed with patient CT chest abdomen pelvis showed no progression.  Labs reviewed and discussed with patient. Proceed with Durvalumab

## 2022-06-20 NOTE — Progress Notes (Signed)
[facility] [address] [place], [place] [address] [phone]   New Patient Evaluation  Date of Service: 06/20/22 Patient Name: Joshua [name] Patient MRN: [id number] Patient DOB: June 27, 1956 Provider: Ventura Sellers, MD  Identifying Statement:  Joshua [name] is a 66 y.o. male with Malignant neoplasm metastatic to brain [facility]) who presents for initial consultation and evaluation regarding cancer associated neurologic deficits.    Referring Provider: [name], [facility] [place],  [place] [address]  Primary Cancer:  Oncologic History: Oncology History  Squamous cell carcinoma of right lung ([place])  09/18/2020 Imaging   PET scan showed right upper lobe nodule maximal SUV 11.5.  Hypermetabolic cluster right paratracheal adenopathy with maximum SUV up to 12.  No findings of metastatic disease to the neck/abdomen/pelvis or skeleton.   09/20/2020 Initial Diagnosis   Squamous cell carcinoma of right lung (HCC) NGS showed PD-L1 TPS 95%, LRP1B S929f, PIK3CA E545K, RB1 c1422-2A>T, RIT1 T83_ A84del, STK11 P2858f TPS53 R248W, TMB 19.40m32mmb, MS stable.    09/20/2020 Cancer Staging   Staging form: Lung, AJCC 8th Edition - Clinical stage from 09/20/2020: Stage IV (cT1b, cN2, cM1) - Signed by Yu,Earlie ServerD on 12/13/2021   10/03/2020 Imaging   MRI brain with and without contrast showed no evidence of intracranial metastatic disease. Well-defined round lesion within the left parotid tail measuring approximately 2.3 x 1.8 x 1.8 cm   10/09/2020 - 11/27/2020 Radiation Therapy   concurrent chemoradiation   10/10/2020 - 10/31/2020 Chemotherapy   10/10/2020 concurrent chemoradiation.  carboplatin AUC of 2 and Taxol 45 mg/m2 x 4.  Additional chemotherapy was held due to thrombocytopenia, neutropenia       12/31/2020 - 03/21/2022 Chemotherapy   Maintenance  Durvalumab q14d      12/31/2020 -  Chemotherapy    Patient is on Treatment Plan : LUNG Durvalumab (10) q14d     03/28/2021 Imaging   CT without contrast showed new linear right perihilar consolidation with associated traction bronchiectasis.  Likely postradiation changes.  Additional new peribronchial vascular/nodular groundglass opacities with areas of consolidation or seen primarily in the right upper and lower lobes, more distant from expected radiation fields. 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  Per my discussion with pulmonology Dr. 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     05/09/2021 Imaging   Repeat CT chest without contrast showed evolving radiation changes in the right perihilar region likely accounting for interval increase to thickening of the posterior wall of the right upper lobe bronchus.  The more peripheral right lung groundglass opacities seen on most recent study have generally improved with exception of one peripherally right upper lobe which appears new.  These are likely inflammatory.  No findings highly suspicious for local recurrence.   05/30/2021 - 01/10/2022 Chemotherapy   Durvalumab q14d     10/07/2021 Imaging   MRI brain with and without contrast showed 2 contrast-enhancing lesions, most consistent with metastatic disease.  Largest lesion is in the left frontal lobe and measures up to 1.3 cm with a large amount of surrounding edema and 9 mm rightward midline shift.  The other enhancing lesion was 4 mm in the right occipital lobe with a small amount of surrounding edema.  No acute or chronic hemorrhage.   10/07/2021 Progression   Headache after colonoscopy, progressive difficulty using  her right arm.  Progressively worsening of speech. Stage IV lung cancer with brain metastasis.  started on Dexamethasone.  He was seen by neurosurgery and radiation oncology.  Recommendation is to proceed  with SRS.  Patient was discharged with dexamethasone   10/16/2021 Imaging   PET scan showed postradiation changes in the right supra hilar lung without evidence of local recurrence.  No evidence of lung cancer recurrence or metastatic disease.  Single focus of intense metabolic activity associated with posterior left 11th rib likely secondary to recent fall/posttraumatic metabolic changes.  Frontal  uptake in the left frontal lobe corresponding to the known metastatic lesion in the brain   10/29/2021 -  Radiation Therapy   Brain Radiation [facility]   12/10/2021 Imaging   Brain MRI: Continued interval decrease in size of a left frontal lobe metastasis, now measuring 10 mm. Moderate surrounding edema has also decreased.  3-4 mm metastasis within the right occipital lobe, not significantly changed in size. There is now a small focus of central non enhancement within this lesion. Increased edema. 3 mm enhancing metastasis within the high left parietal lobe. In retrospect, this was likely present as a subtle punctate focus of enhancement on the prior brain MRI of 10/22/2021.   01/18/2022 Imaging   CT chest abdomen pelvis w contrast 1. Stable post treatment changes in the right upper lobe medially with dense radiation fibrosis but no findings to suggest local recurrent tumor. 2. Significant progression of ill-defined ground-glass opacity, interstitial thickening and airspace nodularity in the right upper lobe and upper aspect of the right lower lobe. Findings could reflect an inflammatory or atypical infectious process, drug induced pneumonitis or interstitial spread of tumor.3. No mediastinal or hilar mass or adenopathy. 4. No findings for abdominal/pelvic metastatic disease or osseous metastatic disease.   02/19/2022 Imaging   MRI brain w wo contrast showed Three metastatic deposits in the brain appear smaller. Decreased edema in the left parietal lobe. No new lesions    04/04/2022 Imaging   CT chest w  contrast 1. Stable radiation changes in the right perihilar region without evidence of local recurrence. 2. Interval improvement in the patchy ground-glass opacities previously demonstrated in both lungs, most consistent with a resolving infectious or inflammatory process (including immunotherapy-related pneumonitis). 3. No evidence of metastatic disease.   05/14/2022 Miscellaneous   He had a mechanical fall and fractured his left elbow. xrays showed a left elbow fracture  05/26/22 seen by orthopedic surgeon, cast applied.    05/27/2022 Imaging   MRI brain w wo contrast 1. All three known brain metastases have enlarged since August, with increasing regional edema. And there is a small new left anterior cerebellar 4-5 mm metastasis (was questionably punctate on the May MRI). This constellation could reflect a combination of True Progression (Left Cerebellar lesion) and pseudoprogression/radionecrosis (3 existing mets).    2. No significant intracranial mass effect. 3. Underlying moderate cerebral white matter signal changes.   05/30/2022 Imaging   CT chest abdomen pelvis w contrast  1. 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 There is a stable small right pleural effusion, but no definitive findings to suggest residual or recurrent disease on today's examination. 2. No signs of metastatic disease in the abdomen or pelvis.  3. Aortic atherosclerosis, in addition to two-vessel coronary artery disease. Please note that although the presence of coronary artery calcium documents the presence of coronary artery disease, the severity of  this disease and any potential stenosis cannot be assessed on this non-gated CT examination. Assessment for potential risk factor modification, dietary therapy or pharmacologic therapy may be warranted, if clinically indicated. 4. Hepatic  steatosis. 5. Colonic diverticulosis without evidence of acute diverticulitis at this time. 6. Incompletely imaged but apparently occluded vascular graft in the superior aspect of the right anterior thigh. Referral to vascular specialist for further clinical evaluation should be considered if clinically appropriate. 7. Additional incidental findings, as above.    CNS Oncologic History 10/29/21: SRS to 3 metastases ([place])  History of Present Illness: The patient's records from the referring physician were obtained and reviewed and the patient interviewed to confirm this HPI.  Joshua [name] presents today for evaluation for neurologic complaints.  He describes gradual onset of impaired speech output and right arm greater than right leg weakness.  This led to a fall and an injury to his left arm.  He was started on decadron two weeks ago, this has led to considerable improvement in symptoms.  Today he feels at his prior baseline.  No issues tolerating the steroids.  He is due for durvalumab infusion today with Dr. Tasia Catchings.   Medications: Current Outpatient Medications on File Prior to Visit  Medication Sig Dispense Refill   aspirin EC 81 MG tablet Take 81 mg by mouth daily. Swallow whole.     clopidogrel (PLAVIX) 75 MG tablet TAKE 1 TABLET BY MOUTH  DAILY 100 tablet 2   dexamethasone (DECADRON) 4 MG tablet Take 1 tablet (4 mg total) by mouth 2 (two) times daily. 14 tablet 0   dexamethasone (DECADRON) 4 MG tablet Take 1 tablet (4 mg total) by mouth daily. 30 tablet 0   famotidine (PEPCID) 20 MG tablet Take 20 mg by mouth daily.     Glycopyrrolate-Formoterol (BEVESPI AEROSPHERE) 9-4.8 MCG/ACT AERO Inhale 2 puffs into the lungs 2 (two) times daily. 32.1 g 1   levETIRAcetam (KEPPRA) 500 MG tablet Take 1 tablet (500 mg total) by mouth 2 (two) times daily. 180 tablet 3   levothyroxine (SYNTHROID) 100 MCG tablet TAKE 1 TABLET BY MOUTH ONCE DAILY BEFORE BREAKFAST 30 tablet 0   oxyCODONE (OXY  IR/ROXICODONE) 5 MG immediate release tablet Take 5 mg by mouth every 4 (four) hours as needed.     simvastatin (ZOCOR) 40 MG tablet Take 40 mg by mouth at bedtime.     No current facility-administered medications on file prior to visit.    Allergies: No Known Allergies Past Medical History:  Past Medical History:  Diagnosis Date   Allergy    Cancer ([place])    lung   COPD (chronic obstructive pulmonary disease) (HCC)    Dyspnea    former smoker. only doe   History of kidney stones    Hypertension    Peripheral vascular disease ([place])    Pre-diabetes    Sleep apnea    Past Surgical History:  Past Surgical History:  Procedure Laterality Date   ANTERIOR CERVICAL DECOMP/DISCECTOMY FUSION N/A 03/14/2020   Procedure: ANTERIOR CERVICAL DECOMPRESSION/DISCECTOMY FUSION 3 LEVELS C4-7;  Surgeon: Meade Maw, MD;  Location: [facility] ORS;  Service: Neurosurgery;  Laterality: N/A;   BACK SURGERY  2011   CENTRAL LINE INSERTION Right 09/10/2016   Procedure: CENTRAL LINE INSERTION;  Surgeon: Katha Cabal, MD;  Location: [facility] ORS;  Service: Vascular;  Laterality: Right;   CERVICAL FUSION     C4-c7 on Mar 14, 2020   COLONOSCOPY  2013 ?   COLONOSCOPY N/A  09/09/2021   Procedure: COLONOSCOPY;  Surgeon: [name], DO;  Location: [facility] ENDOSCOPY;  Service: Gastroenterology;  Laterality: N/A;   CORONARY ANGIOPLASTY     ESOPHAGOGASTRODUODENOSCOPY N/A 09/09/2021   Procedure: ESOPHAGOGASTRODUODENOSCOPY (EGD);  Surgeon: [name], DO;  Location: [facility] ENDOSCOPY;  Service: Gastroenterology;  Laterality: N/A;   FEMORAL-POPLITEAL BYPASS GRAFT Right 09/10/2016   Procedure: BYPASS GRAFT FEMORAL-POPLITEAL ARTERY ( BELOW KNEE );  Surgeon: Katha Cabal, MD;  Location: [facility] ORS;  Service: Vascular;  Laterality: Right;   FEMORAL-TIBIAL BYPASS GRAFT Right 03/23/2020   Procedure: BYPASS GRAFT RIGHT FEMORAL- DISTAL TIBIAL ARTERY WITH DISTAL FLOW GRAFT;  Surgeon: Katha Cabal, MD;   Location: [facility] ORS;  Service: Vascular;  Laterality: Right;   LEFT HEART CATH AND CORONARY ANGIOGRAPHY Left 05/16/2021   Procedure: LEFT HEART CATH AND CORONARY ANGIOGRAPHY;  Surgeon: Andrez Grime, MD;  Location: [place] CV LAB;  Service: Cardiovascular;  Laterality: Left;   LOWER EXTREMITY ANGIOGRAPHY Right 11/18/2016   Procedure: Lower Extremity Angiography;  Surgeon: Katha Cabal, MD;  Location: [place] [facility] LAB;  Service: Cardiovascular;  Laterality: Right;   LOWER EXTREMITY ANGIOGRAPHY Right 12/09/2016   Procedure: Lower Extremity Angiography;  Surgeon: Katha Cabal, MD;  Location: [place] [facility] LAB;  Service: Cardiovascular;  Laterality: Right;   LOWER EXTREMITY ANGIOGRAPHY Right 11/15/2019   Procedure: LOWER EXTREMITY ANGIOGRAPHY;  Surgeon: Katha Cabal, MD;  Location: [place] [facility] LAB;  Service: Cardiovascular;  Laterality: Right;   LOWER EXTREMITY ANGIOGRAPHY Right 12/01/2019   Procedure: Lower Extremity Angiography;  Surgeon: Algernon Huxley, MD;  Location: [place] [facility] LAB;  Service: Cardiovascular;  Laterality: Right;   LOWER EXTREMITY ANGIOGRAPHY Right 12/02/2019   Procedure: LOWER EXTREMITY ANGIOGRAPHY;  Surgeon: Katha Cabal, MD;  Location: [place] [facility] LAB;  Service: Cardiovascular;  Laterality: Right;   LOWER EXTREMITY ANGIOGRAPHY Right 03/23/2020   Procedure: Lower Extremity Angiography;  Surgeon: Katha Cabal, MD;  Location: [place] [facility];  Service: Cardiovascular;  Laterality: Right;   LOWER EXTREMITY ANGIOGRAPHY Right 09/25/2021   Procedure: Lower Extremity Angiography;  Surgeon: Katha Cabal, MD;  Location: [place] [facility] LAB;  Service: Cardiovascular;  Laterality: Right;   LOWER EXTREMITY ANGIOGRAPHY Right 09/26/2021   Procedure: Lower Extremity Angiography;  Surgeon: Algernon Huxley, MD;  Location: [place] [facility] LAB;  Service: Cardiovascular;  Laterality: Right;   LOWER EXTREMITY ANGIOGRAPHY Right 04/08/2022    Procedure: Lower Extremity Angiography;  Surgeon: Katha Cabal, MD;  Location: [place] [facility];  Service: Cardiovascular;  Laterality: Right;   LOWER EXTREMITY INTERVENTION  11/18/2016   Procedure: Lower Extremity Intervention;  Surgeon: Katha Cabal, MD;  Location: [place] [facility] LAB;  Service: Cardiovascular;;   PERIPHERAL VASCULAR CATHETERIZATION Right 01/02/2015   Procedure: Lower Extremity Angiography;  Surgeon: Katha Cabal, MD;  Location: [place] [facility];  Service: Cardiovascular;  Laterality: Right;   PERIPHERAL VASCULAR CATHETERIZATION Right 06/18/2015   Procedure: Lower Extremity Angiography;  Surgeon: Algernon Huxley, MD;  Location: [place] [facility];  Service: Cardiovascular;  Laterality: Right;   PERIPHERAL VASCULAR CATHETERIZATION  06/18/2015   Procedure: Lower Extremity Intervention;  Surgeon: Algernon Huxley, MD;  Location: [place] [facility];  Service: Cardiovascular;;   PERIPHERAL VASCULAR CATHETERIZATION N/A 10/09/2015   Procedure: Abdominal Aortogram w/Lower Extremity;  Surgeon: Katha Cabal, MD;  Location: [place] [facility] LAB;  Service: Cardiovascular;  Laterality: N/A;   PERIPHERAL VASCULAR CATHETERIZATION  10/09/2015  Procedure: Lower Extremity Intervention;  Surgeon: Katha Cabal, MD;  Location: [place] [facility];  Service: Cardiovascular;;   PERIPHERAL VASCULAR CATHETERIZATION Right 10/10/2015   Procedure: Lower Extremity Angiography;  Surgeon: Algernon Huxley, MD;  Location: [place] [facility];  Service: Cardiovascular;  Laterality: Right;   PERIPHERAL VASCULAR CATHETERIZATION Right 10/10/2015   Procedure: Lower Extremity Intervention;  Surgeon: Algernon Huxley, MD;  Location: [place] [facility];  Service: Cardiovascular;  Laterality: Right;   PERIPHERAL VASCULAR CATHETERIZATION Right 12/03/2015   Procedure: Lower Extremity Angiography;  Surgeon: Algernon Huxley, MD;  Location: [place] [facility];  Service: Cardiovascular;  Laterality: Right;    PERIPHERAL VASCULAR CATHETERIZATION Right 12/04/2015   Procedure: Lower Extremity Angiography;  Surgeon: Algernon Huxley, MD;  Location: [place] [facility];  Service: Cardiovascular;  Laterality: Right;   PERIPHERAL VASCULAR CATHETERIZATION  12/04/2015   Procedure: Lower Extremity Intervention;  Surgeon: Algernon Huxley, MD;  Location: [place] [facility];  Service: Cardiovascular;;   PERIPHERAL VASCULAR CATHETERIZATION Right 06/30/2016   Procedure: Lower Extremity Angiography;  Surgeon: Algernon Huxley, MD;  Location: [place] [facility];  Service: Cardiovascular;  Laterality: Right;   PERIPHERAL VASCULAR CATHETERIZATION Right 06/25/2016   Procedure: Lower Extremity Angiography;  Surgeon: Katha Cabal, MD;  Location: [place] [facility];  Service: Cardiovascular;  Laterality: Right;   PERIPHERAL VASCULAR CATHETERIZATION Right 07/01/2016   Procedure: Lower Extremity Angiography;  Surgeon: Katha Cabal, MD;  Location: [place] [facility];  Service: Cardiovascular;  Laterality: Right;   PERIPHERAL VASCULAR CATHETERIZATION  07/01/2016   Procedure: Lower Extremity Intervention;  Surgeon: Katha Cabal, MD;  Location: [place] [facility];  Service: Cardiovascular;;   PERIPHERAL VASCULAR CATHETERIZATION Right 08/01/2016   Procedure: Lower Extremity Angiography;  Surgeon: Katha Cabal, MD;  Location: [place] [facility];  Service: Cardiovascular;  Laterality: Right;   PORTA CATH INSERTION N/A 10/05/2020   Procedure: PORTA CATH INSERTION;  Surgeon: Katha Cabal, MD;  Location: [place] [facility];  Service: Cardiovascular;  Laterality: N/A;   stent placement in right leg Right    VIDEO BRONCHOSCOPY N/A 09/14/2020   Procedure: VIDEO BRONCHOSCOPY WITH FLUORO;  Surgeon: Tyler Pita, MD;  Location: [facility] ORS;  Service: Cardiopulmonary;  Laterality: N/A;   VIDEO BRONCHOSCOPY WITH ENDOBRONCHIAL ULTRASOUND N/A 09/14/2020   Procedure: VIDEO BRONCHOSCOPY WITH ENDOBRONCHIAL ULTRASOUND;   Surgeon: Tyler Pita, MD;  Location: [facility] ORS;  Service: Cardiopulmonary;  Laterality: N/A;   Social History:  Social History   Socioeconomic History   Marital status: Single    Spouse name: Not on file   Number of children: Not on file   Years of education: Not on file   Highest education level: Not on file  Occupational History   Occupation: unemployed  Tobacco Use   Smoking status: Former    Packs/day: 2.00    Years: 38.00    Total pack years: 76.00    Types: Cigarettes    Quit date: 12/20/2014    Years since quitting: 7.5   Smokeless tobacco: Never   Tobacco comments:    quit smoking in 2017 most smoked 2   Vaping Use   Vaping Use: Former  Substance and Sexual Activity   Alcohol use: Not Currently   Drug use: No   Sexual activity: Yes  Other Topics Concern   Not on file  Social History Narrative   Not on file   Social Determinants of Health  Financial Resource Strain: Not on file  Food Insecurity: Not on file  Transportation Needs: Not on file  Physical Activity: Not on file  Stress: Not on file  Social Connections: Not on file  Intimate Partner Violence: Not on file   Family History:  Family History  Problem Relation Age of Onset   Diabetes Mother    Hypertension Mother    Heart murmur Mother    Leukemia Mother    Throat cancer Maternal Grandmother    Colon cancer Neg Hx    Breast cancer Neg Hx     Review of Systems: Constitutional: Doesn't report fevers, chills or abnormal weight loss Eyes: Doesn't report blurriness of vision Ears, nose, mouth, throat, and face: Doesn't report sore throat Respiratory: Doesn't report cough, dyspnea or wheezes Cardiovascular: Doesn't report palpitation, chest discomfort  Gastrointestinal:  Doesn't report nausea, constipation, diarrhea GU: Doesn't report incontinence Skin: Doesn't report skin rashes Neurological: Per HPI Musculoskeletal: Doesn't report joint pain Behavioral/Psych: Doesn't report  anxiety  Physical Exam: Vitals:   06/20/22 0852  BP: (!) 154/86  Pulse: 82  Resp: 18  SpO2: 100%   KPS: 80. General: Alert, cooperative, pleasant, in no acute distress Head: Normal EENT: No conjunctival injection or scleral icterus.  Lungs: Resp effort normal Cardiac: Regular rate Abdomen: Non-distended abdomen Skin: No rashes cyanosis or petechiae. Extremities: Left arm in sling  Neurologic Exam: Mental Status: Awake, alert, attentive to examiner. Oriented to self and environment. Language is fluent with intact comprehension.  Cranial Nerves: Visual acuity is grossly normal. Visual fields are full. Extra-ocular movements intact. No ptosis. Face is symmetric Motor: Tone and bulk are normal. Power is full in both arms and legs. Reflexes are symmetric, no pathologic reflexes present.  Sensory: Intact to light touch Gait: Normal.   Labs: I have reviewed the data as listed    Component Value Date/Time   NA 139 06/06/2022 0815   NA 141 10/14/2019 1101   K 3.6 06/06/2022 0815   CL 106 06/06/2022 0815   CO2 26 06/06/2022 0815   GLUCOSE 137 (H) 06/06/2022 0815   BUN 18 06/06/2022 0815   BUN 15 10/14/2019 1101   BUN 16 07/03/2014 1205   CREATININE 1.04 06/06/2022 0815   CREATININE 1.34 (H) 07/03/2014 1205   CALCIUM 9.3 06/06/2022 0815   PROT 7.1 06/06/2022 0815   PROT 7.0 04/16/2020 1525   ALBUMIN 3.9 06/06/2022 0815   ALBUMIN 4.3 04/16/2020 1525   AST 26 06/06/2022 0815   ALT 16 06/06/2022 0815   ALKPHOS 77 06/06/2022 0815   BILITOT 0.4 06/06/2022 0815   BILITOT 0.4 04/16/2020 1525   GFRNONAA >60 06/06/2022 0815   GFRNONAA 59 (L) 07/03/2014 1205   GFRAA >60 03/30/2020 0536   GFRAA >60 07/03/2014 1205   Lab Results  Component Value Date   WBC 4.4 06/06/2022   NEUTROABS 2.7 06/06/2022   HGB 12.6 (L) 06/06/2022   HCT 39.0 06/06/2022   MCV 85.5 06/06/2022   PLT 273 06/06/2022    Imaging:  NM Bone Scan Whole Body  Result Date: 06/10/2022 CLINICAL DATA:   Squamous cell carcinoma of the lung EXAM: NUCLEAR MEDICINE WHOLE BODY BONE SCAN TECHNIQUE: Whole body anterior and posterior images were obtained approximately 3 hours after intravenous injection of radiopharmaceutical. RADIOPHARMACEUTICALS:  20.51 mCi Technetium-44mMDP IV COMPARISON:  None Available Radiographic correlation: CT chest abdomen pelvis 05/28/2022 FINDINGS: Significant uptake at LEFT elbow corresponding to known fracture. Due to limitations of elbow positioning, LEFT hand projects over LEFT  iliac wing on anterior view. Uptake at adjacent posterior LEFT tenth eleventh twelfth ribs, consistent with healing fractures. Uptake at costovertebral junction LEFT seventh rib corresponding to degenerative changes on CT. No worrisome sites of tracer uptake to suggest osseous metastatic disease. Portions of pelvis obscured by retained tracer within urinary bladder. Expected urinary tract and soft tissue distribution of tracer otherwise identified. IMPRESSION: Degenerative and posttraumatic type uptake as above. No scintigraphic evidence of osseous metastatic disease. Electronically Signed   By: Lavonia Dana M.D.   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 Follow-up study. * Tracking Code: BO * EXAM: CT CHEST, ABDOMEN, AND PELVIS WITH CONTRAST TECHNIQUE: Multidetector CT imaging of the chest, abdomen and pelvis was performed following the standard protocol during bolus administration of intravenous contrast. RADIATION DOSE REDUCTION: This exam was performed according to the departmental dose-optimization program which includes automated exposure control, adjustment of the mA and/or kV according to patient size and/or use of iterative reconstruction technique. CONTRAST:  116m OMNIPAQUE IOHEXOL 300 MG/ML  SOLN  COMPARISON:  Chest CT 04/04/2022. CT of the chest, abdomen and pelvis 01/17/2022. FINDINGS: CT CHEST FINDINGS Cardiovascular: Heart size is normal. There is no significant pericardial fluid, thickening or pericardial calcification. There is aortic atherosclerosis, as well as atherosclerosis of the great vessels of the mediastinum and the coronary arteries, including calcified atherosclerotic plaque in the left anterior descending and right coronary arteries. Mediastinum/Nodes: No pathologically enlarged mediastinal or hilar lymph nodes. Esophagus is unremarkable in appearance. No axillary lymphadenopathy. Lungs/Pleura: Chronic mass-like architectural distortion and volume loss is noted in the perihilar aspect of the right mid to upper lung, similar to prior examinations, most compatible with areas of chronic postradiation mass-like fibrosis. The surrounding regions of ground-glass attenuation and septal thickening seen on prior examinations persist, but have decreased compared to the prior study, most likely to reflect resolving postradiation pneumonitis. Trace right pleural effusion lying dependently, similar to the prior study. No left pleural effusion. No new acute consolidative airspace disease. No definite suspicious appearing pulmonary nodules or masses are noted. Musculoskeletal: Orthopedic fixation hardware in the lower cervical spine incompletely imaged. There are no aggressive appearing lytic or blastic lesions noted in the visualized portions of the skeleton. CT ABDOMEN PELVIS FINDINGS Hepatobiliary: Diffuse low attenuation throughout the hepatic parenchyma, indicative of a background of hepatic steatosis. No suspicious cystic or solid hepatic lesions. No intra or extrahepatic biliary ductal dilatation. Gallbladder is normal in appearance. Pancreas: No pancreatic mass. No pancreatic ductal dilatation. No pancreatic or peripancreatic fluid collections or inflammatory changes. Spleen: Unremarkable.  Adrenals/Urinary Tract: Bilateral kidneys and bilateral adrenal glands are normal in appearance. No hydroureteronephrosis. Urinary bladder is unremarkable in appearance. Stomach/Bowel: The appearance of the stomach is normal. No pathologic dilatation of small bowel or colon. Numerous colonic diverticuli are noted, particularly in the sigmoid colon, without surrounding inflammatory changes to suggest an acute diverticulitis at this time. Normal appendix. Vascular/Lymphatic: Atherosclerosis throughout the abdominal aorta and pelvic vasculature. Postoperative changes in the upper right thigh incompletely imaged, with what appears to be a vascular graft bifurcating in the anterior aspect of the upper right thigh, with both limbs of the graft apparently occluded on today's examination. Reproductive: Prostate gland and seminal vesicles are unremarkable in appearance. Other: No significant volume of ascites.  No pneumoperitoneum. Musculoskeletal: Status post PLIF at L5-S1.  There are no aggressive appearing lytic or blastic lesions noted in the visualized portions of the skeleton. IMPRESSION: 1. 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 There is a stable small right pleural effusion, but no definitive findings to suggest residual or recurrent disease on today's examination. 2. No signs of metastatic disease in the abdomen or pelvis. 3. Aortic atherosclerosis, in addition to two-vessel coronary artery disease. Please note that although the presence of coronary artery calcium documents the presence of coronary artery disease, the severity of this disease and any potential stenosis cannot be assessed on this non-gated CT examination. Assessment for potential risk factor modification, dietary therapy or pharmacologic therapy may be warranted, if clinically indicated. 4. Hepatic steatosis. 5. Colonic  diverticulosis without evidence of acute diverticulitis at this time. 6. Incompletely imaged but apparently occluded vascular graft in the superior aspect of the right anterior thigh. Referral to vascular specialist for further clinical evaluation should be considered if clinically appropriate. 7. Additional incidental findings, as above. Electronically Signed   By: Vinnie Langton M.D.   On: 05/30/2022 08:04   MR BRAIN W WO CONTRAST  Result Date: 05/27/2022 CLINICAL DATA:  66 year old male with metastatic lung cancer. Three known brain metastases. Status post brain radiation, reportedly SRS. Restaging. EXAM: MRI HEAD WITHOUT AND WITH CONTRAST TECHNIQUE: Multiplanar, multiecho pulse sequences of the brain and surrounding structures were obtained without and with intravenous contrast. CONTRAST:  16m GADAVIST GADOBUTROL 1 MMOL/ML IV SOLN COMPARISON:  Brain MRI 02/19/2022 and earlier. FINDINGS: Brain: Larger rim enhancing metastasis of the left parietal lobe since August, now 10 mm diameter (3 mm previously. See series 18, image 144. Regional FLAIR hyperintensity is mild but also progressed. Dominant left frontal operculum metastasis has also increased in size and heterogeneously enhancing on series 18, image 104 up to 21 mm diameter now (previously 6 mm). Significant progression of confluent surrounding T2 and FLAIR hyperintensity compatible with vasogenic edema. Much of the enhancing mass is seen to be T2 hypointense on series 10, image 17 and there are some diffusion signal changes. Only minor regional mass effect including on the left lateral ventricle. Progression also of the small right occipital lobe white matter metastasis now 10 mm diameter (previously 4 mm) and increased, moderate regional T2 and FLAIR hyperintense vasogenic edema (series 15, image 21). No significant regional mass effect. New small 4-5 mm solid enhancing left anterior cerebellar metastasis on series 18, image 34. Mild regional T2 and  FLAIR hyperintense edema without mass effect (series 15, image 10). This lesion might have been punctate on 098/33/8250 uncertain. No other enhancing metastasis identified. No superimposed restricted diffusion suggestive of acute infarction. No midline shift, ventriculomegaly, extra-axial collection or acute intracranial hemorrhage. Cervicomedullary junction and pituitary are within normal limits. Widely scattered and patchy additional bilateral cerebral white matter nonspecific T2 and FLAIR hyperintensity appears stable since May. Vascular: Major intracranial vascular flow voids are stable. Dominant distal left vertebral artery. Major dural venous sinuses are enhancing and appear to be patent. Skull and upper cervical spine: Visualized bone marrow signal is within normal limits. Negative visible cervical spine. Sinuses/Orbits: Stable and negative. Other: Visible internal auditory structures appear normal. Negative visible scalp and face. IMPRESSION: 1. All three known brain metastases have enlarged since August, with increasing regional edema. And there is a small new left anterior cerebellar 4-5 mm metastasis (was questionably punctate on the May MRI). This constellation could reflect a combination of  True Progression (Left Cerebellar lesion) and pseudoprogression/radionecrosis (3 existing mets). 2. No significant intracranial mass effect. 3. Underlying moderate cerebral white matter signal changes. Electronically Signed   By: Genevie Ann M.D.   On: 05/27/2022 05:45   CT ELBOW LEFT WO CONTRAST  Result Date: 05/21/2022 CLINICAL DATA:  Fall, follow-up left elbow x-rays EXAM: CT OF THE UPPER LEFT EXTREMITY WITHOUT CONTRAST TECHNIQUE: Multidetector CT imaging of the upper left extremity was performed according to the standard protocol. RADIATION DOSE REDUCTION: This exam was performed according to the departmental dose-optimization program which includes automated exposure control, adjustment of the mA and/or kV  according to patient size and/or use of iterative reconstruction technique. COMPARISON:  Left elbow radiograph 05/14/2022 FINDINGS: Bones/Joint/Cartilage There is a minimally displaced supracondylar fracture of the humerus with associated moderate-sized joint effusion. There is up to 4 mm cortical offset at the medial margin. No condylar split component. Ligaments Suboptimally assessed by CT. Muscles and Tendons No acute myotendinous abnormality or intramuscular collection on noncontrast CT. Soft tissues There is soft tissue swelling along the elbow. IMPRESSION: Minimally displaced supracondylar fracture of the distal humerus. Moderate-sized joint effusion. Electronically Signed   By: Maurine Simmering M.D.   On: 05/21/2022 13:26    [place] Clinician Interpretation: I have personally reviewed the radiological images as listed.  My interpretation, in the context of the patient's clinical presentation, is likely treatment effect   Assessment/Plan Malignant neoplasm metastatic to brain [facility])  Joshua [name] is clinically improved today, now having completed ~2 weeks of corticosteroids for focal decompensation of treated metastases.  Current dose of decadron is 19m BID.  Suspected etiology is radio-inflammatory effect, given high growth rate, burden of local edema, interval since radiosurgery, and good response to steroids.    There is an additional small focus of enhancement in the cerebellum likely c/w novel and untreated metastasis.    We recommended repeating MRI brain in 1 month to assess response to these interventions.  He will most likely need to stratify for further radiosurgery with Dr. CBaruch Gouty    In meantime, decadron should be decreased to 437mdaily if tolerated.  We spent twenty additional minutes teaching regarding the natural history, biology, and historical experience in the treatment of neurologic complications of cancer.   We appreciate the opportunity to participate in the care of  Joshua [name]  All questions were answered. The patient knows to call the clinic with any problems, questions or concerns. No barriers to learning were detected.  The total time spent in the encounter was 40 minutes and more than 50% was on counseling and review of test results   ZaVentura SellersMD Medical Director of Neuro-Oncology [facility] We[place]2/01/23 9:01 AM

## 2022-06-21 NOTE — Assessment & Plan Note (Signed)
Continue Plavix and follow up with vascular surgery. Dr.Schnier recently stopped Eliquis Recent CT showed likely  occluded vascular graft in the superior aspect of the right anterior thigh.  I discussed with Dr.Schnier

## 2022-06-21 NOTE — Assessment & Plan Note (Signed)
Synthroid 100 mcg daily

## 2022-06-21 NOTE — Assessment & Plan Note (Signed)
Status post radiation.  02/19/22 MRI showed decreased brain lesion size and edema.  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 small new left anterior cerebellar 4-5 mm metastasis  He was seen by Dr.Vaslow today. Dexamethsone decreased to 4mg  daily.   The small left anterior cerebellar lesion is likely a new metastatic lesion, Dr.Vasclow recommend radiation. I have previously sent a inbasket message to Dr.Chrystal.

## 2022-06-21 NOTE — Progress Notes (Signed)
Hematology/Oncology Progress note [phone] Fax:([phone]) [phone]    ASSESSMENT & PLAN:   Cancer Staging  Squamous cell carcinoma of right lung [facility]) Staging form: Lung, AJCC 8th Edition - Clinical stage from 09/20/2020: Stage IV (cT1b, cN2, cM1) - Signed by [name], MD on 12/13/2021   Squamous cell carcinoma of right lung ([place]) Durvalumab was previously held due to pneumonitis.  Repeat CT scan showed improvement of patchy ground-glass opacities.  Reviewed and discussed with patient CT chest abdomen pelvis showed no progression.  Labs reviewed and discussed with patient. Proceed with Durvalumab  PVD (peripheral vascular disease) (HCC) Continue Plavix and follow up with vascular surgery. Dr.Schnier recently stopped Eliquis Recent CT showed likely  occluded vascular graft in the superior aspect of the right anterior thigh.  I discussed with Dr.Schnier    Hypothyroidism due to medication Synthroid 100 mcg daily   Malignant neoplasm metastatic to brain [facility]) Status post radiation.  02/19/22 MRI showed decreased brain lesion size and edema.  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 small new left anterior cerebellar 4-5 mm metastasis  He was seen by Dr.Vaslow today. Dexamethsone decreased to 57m daily.   The small left anterior cerebellar lesion is likely a new metastatic lesion, Dr.Vasclow recommend radiation. I have previously sent a inbasket message to Dr.Chrystal.    Hypocalcemia Recommend patient to take Calcium 12019mdaily.    Orders Placed This Encounter  Procedures   CBC with Differential    Standing Status:   Future    Standing Expiration Date:   08/02/2023   Comprehensive metabolic panel    Standing Status:   Future    Standing Expiration Date:   08/02/2023   TSH    Standing Status:   Future    Standing Expiration Date:   08/02/2023    Follow up lab MD 2 weeks  Durvalumab All questions were answered. The patient knows to call the clinic with any problems, questions or concerns.  ZhEarlie ServerMD, PhD [facility] 06/20/2022      CHIEF COMPLAINTS/REASON FOR VISIT:  Follow up for lung cancer  HISTORY OF PRESENTING ILLNESS:  Joshua HARDACREs a [age].o. male who has above history reviewed by me today presents for follow up visit for Stage IV lung squamous cell carcinoma. Oncology History  Squamous cell carcinoma of right lung ([place] 09/18/2020 Imaging   PET scan showed right upper lobe nodule maximal SUV 11.5.  Hypermetabolic cluster right paratracheal adenopathy with maximum SUV up to 12.  No findings of metastatic disease to the neck/abdomen/pelvis or skeleton.   09/20/2020 Initial Diagnosis   Squamous cell carcinoma of right lung (HCC) NGS showed PD-L1 TPS 95%, LRP1B [id number] E545K, RB1 c1422-2A>T, RIT1 T83_ A84del, STK11 [id number] R248W, TMB 19.5mut17m, MS stable.    09/20/2020 Cancer Staging   Staging form: Lung, AJCC 8th Edition - Clinical stage from 09/20/2020: Stage IV (cT1b, cN2, cM1) - Signed by Latwan Luchsinger, ZEarlie Serveron 12/13/2021   10/03/2020 Imaging   MRI brain with and without contrast showed no evidence of intracranial metastatic disease. Well-defined round lesion within the left parotid tail measuring approximately 2.3 x 1.8 x 1.8 cm   10/09/2020 - 11/27/2020 Radiation Therapy   concurrent chemoradiation   10/10/2020 - 10/31/2020 Chemotherapy   10/10/2020 concurrent chemoradiation.  carboplatin AUC of 2 and Taxol 45 mg/m2 x 4.  Additional chemotherapy was held due to thrombocytopenia, neutropenia  12/31/2020 - 03/21/2022 Chemotherapy   Maintenance  Durvalumab q14d      12/31/2020 -  Chemotherapy   Patient is on Treatment Plan : LUNG Durvalumab (10) q14d     03/28/2021 Imaging   CT without contrast showed new linear right perihilar consolidation with associated traction bronchiectasis.  Likely postradiation  changes.  Additional new peribronchial vascular/nodular groundglass opacities with areas of consolidation or seen primarily in the right upper and lower lobes, more distant from expected radiation fields. 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  Per my discussion with pulmonology Dr. 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     05/09/2021 Imaging   Repeat CT chest without contrast showed evolving radiation changes in the right perihilar region likely accounting for interval increase to thickening of the posterior wall of the right upper lobe bronchus.  The more peripheral right lung groundglass opacities seen on most recent study have generally improved with exception of one peripherally right upper lobe which appears new.  These are likely inflammatory.  No findings highly suspicious for local recurrence.   05/30/2021 - 01/10/2022 Chemotherapy   Durvalumab q14d     10/07/2021 Imaging   MRI brain with and without contrast showed 2 contrast-enhancing lesions, most consistent with metastatic disease.  Largest lesion is in the left frontal lobe and measures up to 1.3 cm with a large amount of surrounding edema and 9 mm rightward midline shift.  The other enhancing lesion was 4 mm in the right occipital lobe with a small amount of surrounding edema.  No acute or chronic hemorrhage.   10/07/2021 Progression   Headache after colonoscopy, progressive difficulty using her right arm.  Progressively worsening of speech. Stage IV lung cancer with brain metastasis.  started on Dexamethasone.  He was seen by neurosurgery and radiation oncology.  Recommendation is to proceed with SRS.  Patient was discharged with dexamethasone   10/16/2021 Imaging   PET scan showed postradiation changes in the right supra hilar lung without evidence of local recurrence.  No evidence of lung cancer  recurrence or metastatic disease.  Single focus of intense metabolic activity associated with posterior left 11th rib likely secondary to recent fall/posttraumatic metabolic changes.  Frontal  uptake in the left frontal lobe corresponding to the known metastatic lesion in the brain   10/29/2021 -  Radiation Therapy   Brain Radiation [facility]   12/10/2021 Imaging   Brain MRI: Continued interval decrease in size of a left frontal lobe metastasis, now measuring 10 mm. Moderate surrounding edema has also decreased.  3-4 mm metastasis within the right occipital lobe, not significantly changed in size. There is now a small focus of central non enhancement within this lesion. Increased edema. 3 mm enhancing metastasis within the high left parietal lobe. In retrospect, this was likely present as a subtle punctate focus of enhancement on the prior brain MRI of 10/22/2021.   01/18/2022 Imaging   CT chest abdomen pelvis w contrast 1. Stable post treatment changes in the right upper lobe medially with dense radiation fibrosis but no findings to suggest local recurrent tumor. 2. Significant progression of ill-defined ground-glass opacity, interstitial thickening and airspace nodularity in the right upper lobe and upper aspect of the right lower lobe. Findings could reflect an inflammatory or atypical infectious process, drug induced pneumonitis or interstitial spread of tumor.3. No mediastinal or hilar mass or adenopathy. 4. No findings for  abdominal/pelvic metastatic disease or osseous metastatic disease.   02/19/2022 Imaging   MRI brain w wo contrast showed Three metastatic deposits in the brain appear smaller. Decreased edema in the left parietal lobe. No new lesions    04/04/2022 Imaging   CT chest w contrast 1. Stable radiation changes in the right perihilar region without evidence of local recurrence. 2. Interval improvement in the patchy ground-glass opacities previously demonstrated in both lungs, most  consistent with a resolving infectious or inflammatory process (including immunotherapy-related pneumonitis). 3. No evidence of metastatic disease.   05/14/2022 Miscellaneous   He had a mechanical fall and fractured his left elbow. xrays showed a left elbow fracture  05/26/22 seen by orthopedic surgeon, cast applied.    05/27/2022 Imaging   MRI brain w wo contrast 1. All three known brain metastases have enlarged since August, with increasing regional edema. And there is a small new left anterior cerebellar 4-5 mm metastasis (was questionably punctate on the May MRI). This constellation could reflect a combination of True Progression (Left Cerebellar lesion) and pseudoprogression/radionecrosis (3 existing mets).    2. No significant intracranial mass effect. 3. Underlying moderate cerebral white matter signal changes.   05/30/2022 Imaging   CT chest abdomen pelvis w contrast  1. 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 There is a stable small right pleural effusion, but no definitive findings to suggest residual or recurrent disease on today's examination. 2. No signs of metastatic disease in the abdomen or pelvis.  3. Aortic atherosclerosis, in addition to two-vessel coronary artery disease. Please note that although the presence of coronary artery calcium documents the presence of coronary artery disease, the severity of this disease and any potential stenosis cannot be assessed on this non-gated CT examination. Assessment for potential risk factor modification, dietary therapy or pharmacologic therapy may be warranted, if clinically indicated. 4. Hepatic steatosis. 5. Colonic diverticulosis without evidence of acute diverticulitis at this time. 6. Incompletely imaged but apparently occluded vascular graft in the superior aspect of the right anterior thigh. Referral to  vascular specialist for further clinical evaluation should be considered if clinically appropriate. 7. Additional incidental findings, as above.    Today he feels well.Intermittent headache, is better. denies any focal deficits. chronic SOB, no chest pain wheezing, cough.  + cast applied to his left elbow.  + right lower extremity weakness worse,  + slurred speech  Review of Systems  Constitutional:  Positive for fatigue. Negative for appetite change, chills, fever and unexpected weight change.  HENT:   Negative for hearing loss and voice change.   Eyes:  Negative for eye problems and icterus.  Respiratory:  Negative for chest tightness, cough and shortness of breath.   Cardiovascular:  Negative for chest pain and leg swelling.  Gastrointestinal:  Negative for abdominal distention and abdominal pain.  Endocrine: Negative for hot flashes.  Genitourinary:  Negative for difficulty urinating, dysuria and frequency.   Musculoskeletal:  Negative for arthralgias.       Left elbow fracture  Skin:  Negative for itching and rash.  Neurological:  Positive for extremity weakness, headaches and speech difficulty. Negative for light-headedness and numbness.  Hematological:  Negative for adenopathy. Does not bruise/bleed easily.  Psychiatric/Behavioral:  Negative for confusion.       MEDICAL HISTORY:  Past Medical History:  Diagnosis Date   Allergy    Cancer ([place])    lung   COPD (chronic  obstructive pulmonary disease) (HCC)    Dyspnea    former smoker. only doe   History of kidney stones    Hypertension    Peripheral vascular disease ([place])    Pre-diabetes    Sleep apnea     SURGICAL HISTORY: Past Surgical History:  Procedure Laterality Date   ANTERIOR CERVICAL DECOMP/DISCECTOMY FUSION N/A 03/14/2020   Procedure: ANTERIOR CERVICAL DECOMPRESSION/DISCECTOMY FUSION 3 LEVELS C4-7;  Surgeon: Meade Maw, MD;  Location: [facility] ORS;  Service: Neurosurgery;  Laterality: N/A;   BACK  SURGERY  2011   CENTRAL LINE INSERTION Right 09/10/2016   Procedure: CENTRAL LINE INSERTION;  Surgeon: Katha Cabal, MD;  Location: [facility] ORS;  Service: Vascular;  Laterality: Right;   CERVICAL FUSION     C4-c7 on Mar 14, 2020   COLONOSCOPY  2013 ?   COLONOSCOPY N/A 09/09/2021   Procedure: COLONOSCOPY;  Surgeon: [name], DO;  Location: [facility];  Service: Gastroenterology;  Laterality: N/A;   CORONARY ANGIOPLASTY     ESOPHAGOGASTRODUODENOSCOPY N/A 09/09/2021   Procedure: ESOPHAGOGASTRODUODENOSCOPY (EGD);  Surgeon: [name], DO;  Location: [facility] ENDOSCOPY;  Service: Gastroenterology;  Laterality: N/A;   FEMORAL-POPLITEAL BYPASS GRAFT Right 09/10/2016   Procedure: BYPASS GRAFT FEMORAL-POPLITEAL ARTERY ( BELOW KNEE );  Surgeon: Katha Cabal, MD;  Location: [facility] ORS;  Service: Vascular;  Laterality: Right;   FEMORAL-TIBIAL BYPASS GRAFT Right 03/23/2020   Procedure: BYPASS GRAFT RIGHT FEMORAL- DISTAL TIBIAL ARTERY WITH DISTAL FLOW GRAFT;  Surgeon: Katha Cabal, MD;  Location: [facility] ORS;  Service: Vascular;  Laterality: Right;   LEFT HEART CATH AND CORONARY ANGIOGRAPHY Left 05/16/2021   Procedure: LEFT HEART CATH AND CORONARY ANGIOGRAPHY;  Surgeon: Andrez Grime, MD;  Location: [place] CV LAB;  Service: Cardiovascular;  Laterality: Left;   LOWER EXTREMITY ANGIOGRAPHY Right 11/18/2016   Procedure: Lower Extremity Angiography;  Surgeon: Katha Cabal, MD;  Location: [place] CV LAB;  Service: Cardiovascular;  Laterality: Right;   LOWER EXTREMITY ANGIOGRAPHY Right 12/09/2016   Procedure: Lower Extremity Angiography;  Surgeon: Katha Cabal, MD;  Location: [place] [facility] LAB;  Service: Cardiovascular;  Laterality: Right;   LOWER EXTREMITY ANGIOGRAPHY Right 11/15/2019   Procedure: LOWER EXTREMITY ANGIOGRAPHY;  Surgeon: Katha Cabal, MD;  Location: [place] CV LAB;  Service: Cardiovascular;  Laterality: Right;   LOWER EXTREMITY  ANGIOGRAPHY Right 12/01/2019   Procedure: Lower Extremity Angiography;  Surgeon: Algernon Huxley, MD;  Location: [place] CV LAB;  Service: Cardiovascular;  Laterality: Right;   LOWER EXTREMITY ANGIOGRAPHY Right 12/02/2019   Procedure: LOWER EXTREMITY ANGIOGRAPHY;  Surgeon: Katha Cabal, MD;  Location: [place] CV LAB;  Service: Cardiovascular;  Laterality: Right;   LOWER EXTREMITY ANGIOGRAPHY Right 03/23/2020   Procedure: Lower Extremity Angiography;  Surgeon: Katha Cabal, MD;  Location: [place] CV LAB;  Service: Cardiovascular;  Laterality: Right;   LOWER EXTREMITY ANGIOGRAPHY Right 09/25/2021   Procedure: Lower Extremity Angiography;  Surgeon: Katha Cabal, MD;  Location: [place] CV LAB;  Service: Cardiovascular;  Laterality: Right;   LOWER EXTREMITY ANGIOGRAPHY Right 09/26/2021   Procedure: Lower Extremity Angiography;  Surgeon: Algernon Huxley, MD;  Location: [place] CV LAB;  Service: Cardiovascular;  Laterality: Right;   LOWER EXTREMITY ANGIOGRAPHY Right 04/08/2022   Procedure: Lower Extremity Angiography;  Surgeon: Katha Cabal, MD;  Location: [place] [facility] LAB;  Service: Cardiovascular;  Laterality: Right;   LOWER EXTREMITY INTERVENTION  11/18/2016   Procedure: Lower Extremity Intervention;  Surgeon: Katha Cabal, MD;  Location: [place] CV LAB;  Service: Cardiovascular;;   PERIPHERAL VASCULAR CATHETERIZATION Right 01/02/2015   Procedure: Lower Extremity Angiography;  Surgeon: Katha Cabal, MD;  Location: [place] [facility] LAB;  Service: Cardiovascular;  Laterality: Right;   PERIPHERAL VASCULAR CATHETERIZATION Right 06/18/2015   Procedure: Lower Extremity Angiography;  Surgeon: Algernon Huxley, MD;  Location: [place] [facility] LAB;  Service: Cardiovascular;  Laterality: Right;   PERIPHERAL VASCULAR CATHETERIZATION  06/18/2015   Procedure: Lower Extremity Intervention;  Surgeon: Algernon Huxley, MD;  Location: [place] CV LAB;  Service:  Cardiovascular;;   PERIPHERAL VASCULAR CATHETERIZATION N/A 10/09/2015   Procedure: Abdominal Aortogram w/Lower Extremity;  Surgeon: Katha Cabal, MD;  Location: [place] [facility] LAB;  Service: Cardiovascular;  Laterality: N/A;   PERIPHERAL VASCULAR CATHETERIZATION  10/09/2015   Procedure: Lower Extremity Intervention;  Surgeon: Katha Cabal, MD;  Location: [place] [facility] LAB;  Service: Cardiovascular;;   PERIPHERAL VASCULAR CATHETERIZATION Right 10/10/2015   Procedure: Lower Extremity Angiography;  Surgeon: Algernon Huxley, MD;  Location: [place] [facility] LAB;  Service: Cardiovascular;  Laterality: Right;   PERIPHERAL VASCULAR CATHETERIZATION Right 10/10/2015   Procedure: Lower Extremity Intervention;  Surgeon: Algernon Huxley, MD;  Location: [place] [facility] LAB;  Service: Cardiovascular;  Laterality: Right;   PERIPHERAL VASCULAR CATHETERIZATION Right 12/03/2015   Procedure: Lower Extremity Angiography;  Surgeon: Algernon Huxley, MD;  Location: [place] [facility];  Service: Cardiovascular;  Laterality: Right;   PERIPHERAL VASCULAR CATHETERIZATION Right 12/04/2015   Procedure: Lower Extremity Angiography;  Surgeon: Algernon Huxley, MD;  Location: [place] [facility] LAB;  Service: Cardiovascular;  Laterality: Right;   PERIPHERAL VASCULAR CATHETERIZATION  12/04/2015   Procedure: Lower Extremity Intervention;  Surgeon: Algernon Huxley, MD;  Location: [place] [facility];  Service: Cardiovascular;;   PERIPHERAL VASCULAR CATHETERIZATION Right 06/30/2016   Procedure: Lower Extremity Angiography;  Surgeon: Algernon Huxley, MD;  Location: [place] [facility] LAB;  Service: Cardiovascular;  Laterality: Right;   PERIPHERAL VASCULAR CATHETERIZATION Right 06/25/2016   Procedure: Lower Extremity Angiography;  Surgeon: Katha Cabal, MD;  Location: [place] [facility] LAB;  Service: Cardiovascular;  Laterality: Right;   PERIPHERAL VASCULAR CATHETERIZATION Right 07/01/2016   Procedure: Lower Extremity Angiography;  Surgeon: Katha Cabal, MD;  Location: [place] CV LAB;  Service: Cardiovascular;  Laterality: Right;   PERIPHERAL VASCULAR CATHETERIZATION  07/01/2016   Procedure: Lower Extremity Intervention;  Surgeon: Katha Cabal, MD;  Location: [place] [facility] LAB;  Service: Cardiovascular;;   PERIPHERAL VASCULAR CATHETERIZATION Right 08/01/2016   Procedure: Lower Extremity Angiography;  Surgeon: Katha Cabal, MD;  Location: [place] [facility] LAB;  Service: Cardiovascular;  Laterality: Right;   PORTA CATH INSERTION N/A 10/05/2020   Procedure: PORTA CATH INSERTION;  Surgeon: Katha Cabal, MD;  Location: [place] [facility];  Service: Cardiovascular;  Laterality: N/A;   stent placement in right leg Right    VIDEO BRONCHOSCOPY N/A 09/14/2020   Procedure: VIDEO BRONCHOSCOPY WITH FLUORO;  Surgeon: Tyler Pita, MD;  Location: [facility] ORS;  Service: Cardiopulmonary;  Laterality: N/A;   VIDEO BRONCHOSCOPY WITH ENDOBRONCHIAL ULTRASOUND N/A 09/14/2020   Procedure: VIDEO BRONCHOSCOPY WITH ENDOBRONCHIAL ULTRASOUND;  Surgeon: Tyler Pita, MD;  Location: [facility] ORS;  Service: Cardiopulmonary;  Laterality: N/A;    SOCIAL HISTORY: Social History   Socioeconomic History   Marital status: Single    Spouse name: Not on file   Number of  children: Not on file   Years of education: Not on file   Highest education level: Not on file  Occupational History   Occupation: unemployed  Tobacco Use   Smoking status: Former    Packs/day: 2.00    Years: 38.00    Total pack years: 76.00    Types: Cigarettes    Quit date: 12/20/2014    Years since quitting: 7.5   Smokeless tobacco: Never   Tobacco comments:    quit smoking in 2017 most smoked 2   Vaping Use   Vaping Use: Former  Substance and Sexual Activity   Alcohol use: Not Currently   Drug use: No   Sexual activity: Yes  Other Topics Concern   Not on file  Social History Narrative   Not on file   Social Determinants of Health   Financial Resource  Strain: Not on file  Food Insecurity: Not on file  Transportation Needs: Not on file  Physical Activity: Not on file  Stress: Not on file  Social Connections: Not on file  Intimate Partner Violence: Not on file    FAMILY HISTORY: Family History  Problem Relation Age of Onset   Diabetes Mother    Hypertension Mother    Heart murmur Mother    Leukemia Mother    Throat cancer Maternal Grandmother    Colon cancer Neg Hx    Breast cancer Neg Hx     ALLERGIES:  has No Known Allergies.  MEDICATIONS:  Current Outpatient Medications  Medication Sig Dispense Refill   aspirin EC 81 MG tablet Take 81 mg by mouth daily. Swallow whole.     clopidogrel (PLAVIX) 75 MG tablet TAKE 1 TABLET BY MOUTH  DAILY 100 tablet 2   dexamethasone (DECADRON) 4 MG tablet Take 1 tablet (4 mg total) by mouth daily. 30 tablet 0   famotidine (PEPCID) 20 MG tablet Take 20 mg by mouth daily.     Glycopyrrolate-Formoterol (BEVESPI AEROSPHERE) 9-4.8 MCG/ACT AERO Inhale 2 puffs into the lungs 2 (two) times daily. 32.1 g 1   levETIRAcetam (KEPPRA) 500 MG tablet Take 1 tablet (500 mg total) by mouth 2 (two) times daily. 180 tablet 3   levothyroxine (SYNTHROID) 100 MCG tablet TAKE 1 TABLET BY MOUTH ONCE DAILY BEFORE BREAKFAST 30 tablet 0   oxyCODONE (OXY IR/ROXICODONE) 5 MG immediate release tablet Take 5 mg by mouth every 4 (four) hours as needed.     simvastatin (ZOCOR) 40 MG tablet Take 40 mg by mouth at bedtime.     No current facility-administered medications for this visit.     PHYSICAL EXAMINATION: ECOG PERFORMANCE STATUS: 1 - Symptomatic but completely ambulatory Vitals:   06/20/22 1100  BP: (!) 154/86  Pulse: 82  Resp: 18  Temp: 98 F (36.7 C)  SpO2: 100%   Filed Weights   06/20/22 1100  Weight: 94.8 kg    Physical Exam Constitutional:      General: He is not in acute distress.    Appearance: He is not diaphoretic.     Comments: Patient walks with a cane  HENT:     Head: Normocephalic and  atraumatic.     Nose: Nose normal.     Mouth/Throat:     Pharynx: No oropharyngeal exudate.  Eyes:     General: No scleral icterus.    Pupils: Pupils are equal, round, and reactive to light.  Cardiovascular:     Rate and Rhythm: Normal rate and regular rhythm.     Heart  sounds: No murmur heard. Pulmonary:     Effort: Pulmonary effort is normal. No respiratory distress.     Breath sounds: No rales.  Chest:     Chest wall: No tenderness.  Abdominal:     General: There is no distension.     Palpations: Abdomen is soft.     Tenderness: There is no abdominal tenderness.  Musculoskeletal:     Cervical back: Normal range of motion and neck supple.     Comments: Left upper extremity in sling.  + left hand swelling.  +decreased right lower extremity strength, normal right upper and left lower extremity strength.   Skin:    General: Skin is warm and dry.     Findings: No erythema.  Neurological:     Mental Status: He is alert and oriented to person, place, and time.     Cranial Nerves: No cranial nerve deficit.     Motor: No abnormal muscle tone.     Coordination: Coordination normal.  Psychiatric:        Mood and Affect: Affect normal.      LABORATORY DATA:  I have reviewed the data as listed    Latest Ref Rng & Units 06/20/2022    9:53 AM 06/06/2022    8:15 AM 05/23/2022    8:30 AM  CBC  WBC 4.0 - 10.5 K/uL 11.2  4.4  4.8   Hemoglobin 13.0 - 17.0 g/dL 12.8  12.6  11.3   Hematocrit 39.0 - 52.0 % 39.7  39.0  35.5   Platelets 150 - 400 K/uL 217  273  219       Latest Ref Rng & Units 06/20/2022    9:53 AM 06/06/2022    8:15 AM 05/23/2022    8:30 AM  CMP  Glucose 70 - 99 mg/dL 180  137  113   BUN 8 - 23 mg/dL _0 Creatinine 0.61 - 1.24 mg/dL 0.96  1.04  0.83   Sodium 135 - 145 mmol/L 138  139  137   Potassium 3.5 - 5.1 mmol/L 3.7  3.6  3.5   Chloride 98 - 111 mmol/L 103  106  103   CO2 22 - 32 mmol/L _1 Calcium 8.9 - 10.3 mg/dL 8.9  9.3  8.8   Total  Protein 6.5 - 8.1 g/dL 6.5  7.1  6.6   Total Bilirubin 0.3 - 1.2 mg/dL 0.4  0.4  0.6   Alkaline Phos 38 - 126 U/L 65  77  63   AST 15 - 41 U/L _2 ALT 0 - 44 U/L _3 RADIOGRAPHIC STUDIES: I have personally reviewed the radiological images as listed and agreed with the findings in the report. NM Bone Scan Whole Body  Result Date: 06/10/2022 CLINICAL DATA:  Squamous cell carcinoma of the lung EXAM: NUCLEAR MEDICINE WHOLE BODY BONE SCAN TECHNIQUE: Whole body anterior and posterior images were obtained approximately 3 hours after intravenous injection of radiopharmaceutical. RADIOPHARMACEUTICALS:  20.51 mCi Technetium-102mMDP IV COMPARISON:  None Available Radiographic correlation: CT chest abdomen pelvis 05/28/2022 FINDINGS: Significant uptake at LEFT elbow corresponding to known fracture. Due to limitations of elbow positioning, LEFT hand projects over LEFT iliac wing on anterior view. Uptake at adjacent posterior LEFT tenth eleventh twelfth ribs, consistent with healing fractures. Uptake at costovertebral junction LEFT seventh rib corresponding to degenerative changes on  CT. No worrisome sites of tracer uptake to suggest osseous metastatic disease. Portions of pelvis obscured by retained tracer within urinary bladder. Expected urinary tract and soft tissue distribution of tracer otherwise identified. IMPRESSION: Degenerative and posttraumatic type uptake as above. No scintigraphic evidence of osseous metastatic disease. Electronically Signed   By: Lavonia Dana M.D.   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 Follow-up study. * Tracking Code: BO * EXAM: CT CHEST, ABDOMEN, AND PELVIS WITH CONTRAST TECHNIQUE: Multidetector CT imaging of the chest, abdomen and pelvis was performed  following the standard protocol during bolus administration of intravenous contrast. RADIATION DOSE REDUCTION: This exam was performed according to the departmental dose-optimization program which includes automated exposure control, adjustment of the mA and/or kV according to patient size and/or use of iterative reconstruction technique. CONTRAST:  1105m OMNIPAQUE IOHEXOL 300 MG/ML  SOLN COMPARISON:  Chest CT 04/04/2022. CT of the chest, abdomen and pelvis 01/17/2022. FINDINGS: CT CHEST FINDINGS Cardiovascular: Heart size is normal. There is no significant pericardial fluid, thickening or pericardial calcification. There is aortic atherosclerosis, as well as atherosclerosis of the great vessels of the mediastinum and the coronary arteries, including calcified atherosclerotic plaque in the left anterior descending and right coronary arteries. Mediastinum/Nodes: No pathologically enlarged mediastinal or hilar lymph nodes. Esophagus is unremarkable in appearance. No axillary lymphadenopathy. Lungs/Pleura: Chronic mass-like architectural distortion and volume loss is noted in the perihilar aspect of the right mid to upper lung, similar to prior examinations, most compatible with areas of chronic postradiation mass-like fibrosis. The surrounding regions of ground-glass attenuation and septal thickening seen on prior examinations persist, but have decreased compared to the prior study, most likely to reflect resolving postradiation pneumonitis. Trace right pleural effusion lying dependently, similar to the prior study. No left pleural effusion. No new acute consolidative airspace disease. No definite suspicious appearing pulmonary nodules or masses are noted. Musculoskeletal: Orthopedic fixation hardware in the lower cervical spine incompletely imaged. There are no aggressive appearing lytic or blastic lesions noted in the visualized portions of the skeleton. CT ABDOMEN PELVIS FINDINGS Hepatobiliary: Diffuse low  attenuation throughout the hepatic parenchyma, indicative of a background of hepatic steatosis. No suspicious cystic or solid hepatic lesions. No intra or extrahepatic biliary ductal dilatation. Gallbladder is normal in appearance. Pancreas: No pancreatic mass. No pancreatic ductal dilatation. No pancreatic or peripancreatic fluid collections or inflammatory changes. Spleen: Unremarkable. Adrenals/Urinary Tract: Bilateral kidneys and bilateral adrenal glands are normal in appearance. No hydroureteronephrosis. Urinary bladder is unremarkable in appearance. Stomach/Bowel: The appearance of the stomach is normal. No pathologic dilatation of small bowel or colon. Numerous colonic diverticuli are noted, particularly in the sigmoid colon, without surrounding inflammatory changes to suggest an acute diverticulitis at this time. Normal appendix. Vascular/Lymphatic: Atherosclerosis throughout the abdominal aorta and pelvic vasculature. Postoperative changes in the upper right thigh incompletely imaged, with what appears to be a vascular graft bifurcating in the anterior aspect of the upper right thigh, with both limbs of the graft apparently occluded on today's examination. Reproductive: Prostate gland and seminal vesicles are unremarkable in appearance. Other: No significant volume of ascites.  No pneumoperitoneum. Musculoskeletal: Status post PLIF at L5-S1. There are no aggressive appearing lytic or blastic lesions noted in the visualized portions of the skeleton. IMPRESSION: 1. 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 There is a stable small right pleural effusion, but no definitive findings to suggest residual or recurrent disease on today's examination. 2. No signs of metastatic disease in the abdomen or pelvis. 3. Aortic atherosclerosis, in addition to two-vessel coronary artery disease. Please  note that although the presence of coronary artery calcium documents the presence of coronary artery disease, the severity of this disease and any potential stenosis cannot be assessed on this non-gated CT examination. Assessment for potential risk factor modification, dietary therapy or pharmacologic therapy may be warranted, if clinically indicated. 4. Hepatic steatosis. 5. Colonic diverticulosis without evidence of acute diverticulitis at this time. 6. Incompletely imaged but apparently occluded vascular graft in the superior aspect of the right anterior thigh. Referral to vascular specialist for further clinical evaluation should be considered if clinically appropriate. 7. Additional incidental findings, as above. Electronically Signed   By: Vinnie Langton M.D.   On: 05/30/2022 08:04   MR BRAIN W WO CONTRAST  Result Date: 05/27/2022 CLINICAL DATA:  66 year old male with metastatic lung cancer. Three known brain metastases. Status post brain radiation, reportedly SRS. Restaging. EXAM: MRI HEAD WITHOUT AND WITH CONTRAST TECHNIQUE: Multiplanar, multiecho pulse sequences of the brain and surrounding structures were obtained without and with intravenous contrast. CONTRAST:  58m GADAVIST GADOBUTROL 1 MMOL/ML IV SOLN COMPARISON:  Brain MRI 02/19/2022 and earlier. FINDINGS: Brain: Larger rim enhancing metastasis of the left parietal lobe since August, now 10 mm diameter (3 mm previously. See series 18, image 144. Regional FLAIR hyperintensity is mild but also progressed. Dominant left frontal operculum metastasis has also increased in size and heterogeneously enhancing on series 18, image 104 up to 21 mm diameter now (previously 6 mm). Significant progression of confluent surrounding T2 and FLAIR hyperintensity compatible with vasogenic edema. Much of the enhancing mass is seen to be T2 hypointense on series 10, image 17 and there are some diffusion signal changes. Only minor regional mass effect including on the  left lateral ventricle. Progression also of the small right occipital lobe white matter metastasis now 10 mm diameter (previously 4 mm) and increased, moderate regional T2 and FLAIR hyperintense vasogenic edema (series 15, image 21). No significant regional mass effect. New small 4-5 mm solid enhancing left anterior cerebellar metastasis on series 18, image 34. Mild regional T2 and FLAIR hyperintense edema without mass effect (series 15, image 10). This lesion might have been punctate on 026/94/8546 uncertain. No other enhancing metastasis identified. No superimposed restricted diffusion suggestive of acute infarction. No midline shift, ventriculomegaly, extra-axial collection or acute intracranial hemorrhage. Cervicomedullary junction and pituitary are within normal limits. Widely scattered and patchy additional bilateral cerebral white matter nonspecific T2 and FLAIR hyperintensity appears stable since May. Vascular: Major intracranial vascular flow voids are stable. Dominant distal left vertebral artery. Major dural venous sinuses are enhancing and appear to be patent. Skull and upper cervical spine: Visualized bone marrow signal is within normal limits. Negative visible cervical spine. Sinuses/Orbits: Stable and negative. Other: Visible internal auditory structures appear normal. Negative visible scalp and face. IMPRESSION: 1. All three known brain metastases have enlarged since August, with increasing regional edema. And there is a small new left anterior cerebellar 4-5 mm metastasis (was questionably punctate on the May MRI). This constellation could reflect a combination of True Progression (Left Cerebellar lesion) and pseudoprogression/radionecrosis (3 existing mets). 2. No significant intracranial mass effect. 3. Underlying moderate cerebral white matter signal changes. Electronically Signed   By: HLemmie Evens  [place] Crane M.D.   On: 05/27/2022 05:45   CT ELBOW LEFT WO CONTRAST  Result Date: 05/21/2022 CLINICAL DATA:   Fall, follow-up left elbow x-rays EXAM: CT OF THE UPPER LEFT EXTREMITY WITHOUT CONTRAST TECHNIQUE: Multidetector CT imaging of the upper left extremity was performed according to the standard protocol. RADIATION DOSE REDUCTION: This exam was performed according to the departmental dose-optimization program which includes automated exposure control, adjustment of the mA and/or kV according to patient size and/or use of iterative reconstruction technique. COMPARISON:  Left elbow radiograph 05/14/2022 FINDINGS: Bones/Joint/Cartilage There is a minimally displaced supracondylar fracture of the humerus with associated moderate-sized joint effusion. There is up to 4 mm cortical offset at the medial margin. No condylar split component. Ligaments Suboptimally assessed by CT. Muscles and Tendons No acute myotendinous abnormality or intramuscular collection on noncontrast CT. Soft tissues There is soft tissue swelling along the elbow. IMPRESSION: Minimally displaced supracondylar fracture of the distal humerus. Moderate-sized joint effusion. Electronically Signed   By: Maurine Simmering M.D.   On: 05/21/2022 13:26   DG Elbow Complete Left  Result Date: 05/14/2022 CLINICAL DATA:  [place] today with elbow pain EXAM: LEFT ELBOW - COMPLETE 3+ VIEW COMPARISON:  None Available. FINDINGS: Supracondylar fracture of the humerus without angulation or displacement. Joint effusion is present. No fracture of the radius or ulna. IMPRESSION: Supracondylar fracture of the humerus without angulation or displacement. Electronically Signed   By: Nelson Chimes M.D.   On: 05/14/2022 14:32   VAS [place] ABI WITH/WO TBI  Result Date: 05/12/2022  LOWER EXTREMITY DOPPLER STUDY Patient Name:  Joshua [name]  Date of Exam:   05/05/2022 Medical Rec #: [id number]            Accession #:    [id number] Date of Birth: 12-Apr-1956           Patient Gender: M Patient Age:   83 years Exam Location:  [facility] Procedure:      VAS [place] ABI WITH/WO TBI  Referring Phys: --------------------------------------------------------------------------------  Indications: Claudication, peripheral artery disease, and Routine f/u. High Risk Factors: Hyperlipidemia. Other Factors: ASO s/p multiple interventions and bypass.                11/15/2019 Right thrombectomy of Fempop insitu graft with added                stent throughout.                 12/01/2019: Aortogram and Selctive Right Lower Extremity                Angiogram. 6 mg of TPA delivered in a lysis catheterfrom the                Origin of the Right Femoral -Popliteal bypass down the proximal                ATA with placement of the lysis.                 12/02/2019: PTA of the Right Posterior Tibial Artery. PTA of the                Right Peroneal Artery. Mechanical Thrombectomy of the Right                Femoral -Popliteal Bypass graft. Mechanical Thrombectomy of the                Right Tibioperoneal Trunk and Posterior Tibial Artery.  09/25/2021: Right Lower Extremity Angiography third order                catheter placement. PTA of the proximal peroneal and                tibioperoneal trunk to 2.5 mm with an ultra score balloon. PTA of                the Femoral Artery and the proximal anstomosis of the bypass                graft to 4 mm with an ultra score balloon. Administration of 5 mg                of tpa throughout the entire bypass and in the peroneal Artery.                Initiation of continuous tpa infusion Right lower Extremity for                limb salvage. Insertion of a triple lumen catheter Left CFV with                [place] and fluoroscopic guidance.                 09/26/2021: Right LE Angiogram. PTA of the Right Posterior Tibial                Artery with 2.5 mm diameter by 30 cm length angioplasty balloon.                PTA of the distal bypass anastomosis and distal bypass graft                analogous to the Popliteal artery with 4 mm diameter by 8 cm                 length Lutonix drug coated angioplasty balloon.                04/08/2022 right thrombectomy of the Deep profunda.  Vascular Interventions: Hx of multiple right lower extremity PTA's/stents.                         09/10/2016 Right femoral to below the knee popliteal                         artery bypass graft placement. 5/01 Right bypass graft &                         mid popliteal artery PTA & coil embolectomy of large                         branch of bypass graft. 12/09/16 Right distal bypass                         graft PTA/stent and right posterior tibial artery PTA.                         03/23/2020 Rt PTA thrombectomy. Rt Profunda fem artery to                         PTA bypass. Comparison Study: 03/10/2022 Performing Technologist: Concha Norway RVT  Examination Guidelines: A complete evaluation includes at  minimum, Doppler waveform signals and systolic blood pressure reading at the level of bilateral brachial, anterior tibial, and posterior tibial arteries, when vessel segments are accessible. Bilateral testing is considered an integral part of a complete examination. Photoelectric Plethysmograph (PPG) waveforms and toe systolic pressure readings are included as required and additional duplex testing as needed. Limited examinations for reoccurring indications may be performed as noted.  ABI Findings: +---------+------------------+-----+----------+--------+ Right    Rt Pressure (mmHg)IndexWaveform  Comment  +---------+------------------+-----+----------+--------+ Brachial 135                                       +---------+------------------+-----+----------+--------+ ATA                             absent             +---------+------------------+-----+----------+--------+ PTA      72                0.53 monophasic         +---------+------------------+-----+----------+--------+ PERO     88                0.65 monophasic          +---------+------------------+-----+----------+--------+ Great Toe36                0.27 Abnormal           +---------+------------------+-----+----------+--------+ +---------+------------------+-----+---------+-------+ Left     Lt Pressure (mmHg)IndexWaveform Comment +---------+------------------+-----+---------+-------+ Brachial 135                                     +---------+------------------+-----+---------+-------+ ATA      160               1.19 triphasic        +---------+------------------+-----+---------+-------+ PTA      146               1.08 triphasic        +---------+------------------+-----+---------+-------+ Great Toe155               1.15 Normal           +---------+------------------+-----+---------+-------+ +-------+-----------+-----------+------------+------------+ ABI/TBIToday's ABIToday's TBIPrevious ABIPrevious TBI +-------+-----------+-----------+------------+------------+ Right  .65        .27        .28         .00          +-------+-----------+-----------+------------+------------+ Left   1.19       1.15       1.21        .96          +-------+-----------+-----------+------------+------------+ Right ABIs and TBIs appear increased compared to prior study on 02/2022.  Summary: Right: Resting right ankle-brachial index indicates moderate right lower extremity arterial disease. The right toe-brachial index is abnormal. Left: Resting left ankle-brachial index is within normal range. The left toe-brachial index is normal. *See table(s) above for measurements and observations.  Electronically signed by [name] MD on 05/12/2022 at 1:04:36 PM.    Final    PERIPHERAL VASCULAR CATHETERIZATION  Result Date: 04/08/2022 See surgical note for result.  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 Chemotherapy, radiation and  immunotherapy  completed. * Tracking Code: BO * EXAM: CT CHEST WITH CONTRAST TECHNIQUE: Multidetector CT imaging of the chest was performed during intravenous contrast administration. RADIATION DOSE REDUCTION: This exam was performed according to the departmental dose-optimization program which includes automated exposure control, adjustment of the mA and/or kV according to patient size and/or use of iterative reconstruction technique. CONTRAST:  31m OMNIPAQUE IOHEXOL 300 MG/ML  SOLN COMPARISON:  Multiple priors, including CT 01/17/2022 and PET-CT 10/16/2021. FINDINGS: Cardiovascular: Right IJ Port-A-Cath extends to the superior cavoatrial junction. No acute vascular findings. Minimal aortic and coronary artery atherosclerosis. The heart size is normal. There is no pericardial effusion. Mediastinum/Nodes: There are no enlarged mediastinal, hilar or axillary lymph nodes.Stable right perihilar soft tissue thickening. No definite thyroid tissue identified. The esophagus and trachea appear unremarkable. Lungs/Pleura: Trace right pleural effusion. No pneumothorax. There are stable radiation changes in the right perihilar region with architectural distortion and traction bronchiectasis. No recurrent mass lesion identified. The patchy ground-glass opacity seen on the most recent examination have improved. No suspicious pulmonary nodules. Upper abdomen: Mild hepatic steatosis. The visualized upper abdomen otherwise appears unremarkable. Musculoskeletal/Chest wall: There is no chest wall mass or suspicious osseous finding. Old left-sided rib fractures. IMPRESSION: 1. Stable radiation changes in the right perihilar region without evidence of local recurrence. 2. Interval improvement in the patchy ground-glass opacities previously demonstrated in both lungs, most consistent with a resolving infectious or inflammatory process (including immunotherapy-related pneumonitis). 3. No evidence of metastatic disease. Electronically  Signed   By: WRichardean SaleM.D.   On: 04/06/2022 11:53

## 2022-06-21 NOTE — Assessment & Plan Note (Signed)
Recommend patient to take Calcium 1200mg  daily.

## 2022-06-25 ENCOUNTER — Other Ambulatory Visit: Payer: Medicare Other

## 2022-06-27 ENCOUNTER — Other Ambulatory Visit: Payer: Self-pay | Admitting: Oncology

## 2022-06-27 NOTE — Telephone Encounter (Signed)
            Component Ref Range & Units 7 d ago 2 mo ago 3 mo ago 4 mo ago 5 mo ago 6 mo ago 7 mo ago  Free T4 0.61 - 1.12 ng/dL 0.77 1.00 CM 0.74 CM 0.69 CM 0.92 CM 0.93 CM 0.65 CM     Component Ref Range & Units 7 d ago (06/20/22) 3 wk ago (06/06/22) 1 mo ago (05/09/22) 2 mo ago (04/25/22) 2 mo ago (04/07/22) 3 mo ago (03/05/22) 4 mo ago (02/12/22)  TSH 0.350 - 4.500 uIU/mL 8.041 High  71.000 High  CM 32.916 High  CM 42.267 High  CM 34.456 High  CM 102.000 High  CM 7.325 High  CM

## 2022-07-04 ENCOUNTER — Inpatient Hospital Stay (HOSPITAL_BASED_OUTPATIENT_CLINIC_OR_DEPARTMENT_OTHER): Payer: Medicare Other | Admitting: Oncology

## 2022-07-04 ENCOUNTER — Inpatient Hospital Stay: Payer: Medicare Other

## 2022-07-04 ENCOUNTER — Encounter: Payer: Self-pay | Admitting: Oncology

## 2022-07-04 VITALS — BP 155/83 | HR 73 | Temp 97.7°F | Resp 18 | Wt 211.1 lb

## 2022-07-04 DIAGNOSIS — C7931 Secondary malignant neoplasm of brain: Secondary | ICD-10-CM

## 2022-07-04 DIAGNOSIS — C3491 Malignant neoplasm of unspecified part of right bronchus or lung: Secondary | ICD-10-CM | POA: Diagnosis not present

## 2022-07-04 DIAGNOSIS — I739 Peripheral vascular disease, unspecified: Secondary | ICD-10-CM

## 2022-07-04 DIAGNOSIS — E032 Hypothyroidism due to medicaments and other exogenous substances: Secondary | ICD-10-CM | POA: Diagnosis not present

## 2022-07-04 DIAGNOSIS — Z5112 Encounter for antineoplastic immunotherapy: Secondary | ICD-10-CM | POA: Diagnosis not present

## 2022-07-04 LAB — CBC WITH DIFFERENTIAL/PLATELET
Abs Immature Granulocytes: 0.04 10*3/uL (ref 0.00–0.07)
Basophils Absolute: 0 10*3/uL (ref 0.0–0.1)
Basophils Relative: 0 %
Eosinophils Absolute: 0.1 10*3/uL (ref 0.0–0.5)
Eosinophils Relative: 1 %
HCT: 40 % (ref 39.0–52.0)
Hemoglobin: 12.8 g/dL — ABNORMAL LOW (ref 13.0–17.0)
Immature Granulocytes: 1 %
Lymphocytes Relative: 13 %
Lymphs Abs: 0.9 10*3/uL (ref 0.7–4.0)
MCH: 27.2 pg (ref 26.0–34.0)
MCHC: 32 g/dL (ref 30.0–36.0)
MCV: 84.9 fL (ref 80.0–100.0)
Monocytes Absolute: 0.5 10*3/uL (ref 0.1–1.0)
Monocytes Relative: 8 %
Neutro Abs: 5.5 10*3/uL (ref 1.7–7.7)
Neutrophils Relative %: 77 %
Platelets: 177 10*3/uL (ref 150–400)
RBC: 4.71 MIL/uL (ref 4.22–5.81)
RDW: 14.3 % (ref 11.5–15.5)
WBC: 7.1 10*3/uL (ref 4.0–10.5)
nRBC: 0 % (ref 0.0–0.2)

## 2022-07-04 LAB — COMPREHENSIVE METABOLIC PANEL
ALT: 31 U/L (ref 0–44)
AST: 24 U/L (ref 15–41)
Albumin: 3.8 g/dL (ref 3.5–5.0)
Alkaline Phosphatase: 55 U/L (ref 38–126)
Anion gap: 11 (ref 5–15)
BUN: 23 mg/dL (ref 8–23)
CO2: 26 mmol/L (ref 22–32)
Calcium: 9 mg/dL (ref 8.9–10.3)
Chloride: 102 mmol/L (ref 98–111)
Creatinine, Ser: 1.07 mg/dL (ref 0.61–1.24)
GFR, Estimated: >60 mL/min (ref >60)
Glucose, Bld: 98 mg/dL (ref 70–99)
Potassium: 4.2 mmol/L (ref 3.5–5.1)
Sodium: 139 mmol/L (ref 135–145)
Total Bilirubin: 0.6 mg/dL (ref 0.3–1.2)
Total Protein: 6.8 g/dL (ref 6.5–8.1)

## 2022-07-04 LAB — TSH: TSH: 19.367 u[IU]/mL — ABNORMAL HIGH (ref 0.350–4.500)

## 2022-07-04 MED ORDER — SODIUM CHLORIDE 0.9 % IV SOLN
10.0000 mg/kg | Freq: Once | INTRAVENOUS | Status: AC
  Administered 2022-07-04: 1000 mg via INTRAVENOUS
  Filled 2022-07-04: qty 20

## 2022-07-04 MED ORDER — SODIUM CHLORIDE 0.9% FLUSH
10.0000 mL | Freq: Once | INTRAVENOUS | Status: AC
  Administered 2022-07-04: 10 mL via INTRAVENOUS
  Filled 2022-07-04: qty 10

## 2022-07-04 MED ORDER — HEPARIN SOD (PORK) LOCK FLUSH 100 UNIT/ML IV SOLN
500.0000 [IU] | Freq: Once | INTRAVENOUS | Status: AC | PRN
  Administered 2022-07-04: 500 [IU]
  Filled 2022-07-04: qty 5

## 2022-07-04 MED ORDER — SODIUM CHLORIDE 0.9 % IV SOLN
Freq: Once | INTRAVENOUS | Status: AC
  Filled 2022-07-04: qty 250

## 2022-07-04 MED ORDER — SODIUM CHLORIDE 0.9% FLUSH
10.0000 mL | INTRAVENOUS | Status: DC | PRN
  Administered 2022-07-04: 10 mL
  Filled 2022-07-04: qty 10

## 2022-07-04 NOTE — Assessment & Plan Note (Signed)
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  Repeat CT scan showed improvement of patchy ground-glass opacities.  Reviewed and discussed with patient CT chest abdomen pelvis showed no progression.  Labs reviewed and discussed with patient. Proceed with Durvalumab

## 2022-07-04 NOTE — Assessment & Plan Note (Addendum)
Continue Plavix and follow up with vascular surgery. Dr.Schnier recently stopped Eliquis CT showed likely  occluded vascular graft in the superior aspect of the right anterior thigh.  I have previously discussed with Dr.Schnier

## 2022-07-04 NOTE — Assessment & Plan Note (Signed)
Synthroid 100 mcg daily

## 2022-07-04 NOTE — Assessment & Plan Note (Addendum)
Status post radiation.  02/19/22 MRI showed decreased brain lesion size and edema.  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 small new left anterior cerebellar 4-5 mm metastasis  He was seen by Dr.Vaslow, continue Dexamethsone  4mg  daily, repeat MRI  The small left anterior cerebellar lesion is likely a new metastatic lesion, Dr.Vasclow recommend radiation. I have communicated with Dr.Chrystal, he recommends repeat MRI brain and decide.

## 2022-07-04 NOTE — Progress Notes (Signed)
Pt here for follow up. No new concerns voiced.   

## 2022-07-04 NOTE — Assessment & Plan Note (Signed)
Recommend patient to take Calcium 1200mg  daily.

## 2022-07-04 NOTE — Progress Notes (Signed)
Hematology/Oncology Progress note [phone] [phone]    ASSESSMENT & PLAN:   Cancer Staging  Squamous cell carcinoma of right lung [facility]) Staging form: Lung, AJCC 8th Edition - Clinical stage from 09/20/2020: Stage IV (cT1b, cN2, cM1) - Signed by [name], MD on 12/13/2021   Squamous cell carcinoma of right lung ([place]) Durvalumab was previously held due to pneumonitis.  Repeat CT scan showed improvement of patchy ground-glass opacities.  Reviewed and discussed with patient CT chest abdomen pelvis showed no progression.  Labs reviewed and discussed with patient. Proceed with Durvalumab  Hypocalcemia Recommend patient to take Calcium 1278m daily.   Hypothyroidism due to medication Synthroid 100 mcg daily   Malignant neoplasm metastatic to brain ([facility] Status post radiation.  02/19/22 MRI showed decreased brain lesion size and edema.  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 small new left anterior cerebellar 4-5 mm metastasis  He was seen by Dr.Vaslow, continue Dexamethsone  454mdaily, repeat MRI  The small left anterior cerebellar lesion is likely a new metastatic lesion, Dr.Vasclow recommend radiation. I have communicated with Dr.Chrystal, he recommends repeat MRI brain and decide.    PVD (peripheral vascular disease) (HCC) Continue Plavix and follow up with vascular surgery. Dr.Schnier recently stopped Eliquis CT showed likely  occluded vascular graft in the superior aspect of the right anterior thigh.  I have previously discussed with Dr.Schnier     No orders of the defined types were placed in this encounter.   Follow up lab MD 2 weeks Durvalumab All questions were answered. The patient knows to call the clinic with any problems, questions or concerns.  ZhEarlie ServerMD, PhD [facility] 07/04/2022      CHIEF COMPLAINTS/REASON FOR VISIT:  Follow  up for lung cancer  HISTORY OF PRESENTING ILLNESS:  Joshua HEISTs a [age].o. male who has above history reviewed by me today presents for follow up visit for Stage IV lung squamous cell carcinoma. Oncology History  [place] 09[place] Imaging   PET scan showed right upper lobe nodule maximal SUV 11.5.  Hypermetabolic cluster right paratracheal adenopathy with maximum SUV up to 12.  No findings of metastatic disease to the neck/abdomen/pelvis or skeleton.   09/20/2020 Initial Diagnosis   Squamous cell carcinoma of right lung (HCC) NGS showed PD-L1 TPS 95%, LRP1B [id number] E545K, RB1 c1422-2A>T, RIT1 T83_ A84del, STK11 [id number] R248W, TMB 19.5mut36m, MS stable.    09/20/2020 Cancer Staging   Staging form: Lung, AJCC 8th Edition - Clinical stage from 09/20/2020: Stage IV (cT1b, cN2, cM1) - Signed by Kaicen Desena, ZEarlie Serveron 12/13/2021   10/03/2020 Imaging   MRI brain with and without contrast showed no evidence of intracranial metastatic disease. Well-defined round lesion within the left parotid tail measuring approximately 2.3 x 1.8 x 1.8 cm   10/09/2020 - 11/27/2020 Radiation Therapy   concurrent chemoradiation   10/10/2020 - 10/31/2020 Chemotherapy   10/10/2020 concurrent chemoradiation.  carboplatin AUC of 2 and Taxol 45 mg/m2 x 4.  Additional chemotherapy was held due to thrombocytopenia, neutropenia       12/31/2020 - 03/21/2022 Chemotherapy   Maintenance  Durvalumab q14d      12/31/2020 -  Chemotherapy   Patient is on Treatment Plan : LUNG Durvalumab (10) q14d     03/28/2021 Imaging   CT without contrast showed new linear right perihilar consolidation with associated traction bronchiectasis.  Likely postradiation changes.  Additional new peribronchial vascular/nodular groundglass opacities with areas of consolidation or seen primarily in the right upper and lower lobes, more distant from expected radiation fields. Findings are possibly due to  radiation-induced organizing pneumonia   03/28/2021 Adverse Reaction   Durvalumab was held for possible  pneumonitis.  Per my discussion with pulmonology Dr. 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     05/09/2021 Imaging   Repeat CT chest without contrast showed evolving radiation changes in the right perihilar region likely accounting for interval increase to thickening of the posterior wall of the right upper lobe bronchus.  The more peripheral right lung groundglass opacities seen on most recent study have generally improved with exception of one peripherally right upper lobe which appears new.  These are likely inflammatory.  No findings highly suspicious for local recurrence.   05/30/2021 - 01/10/2022 Chemotherapy   Durvalumab q14d     10/07/2021 Imaging   MRI brain with and without contrast showed 2 contrast-enhancing lesions, most consistent with metastatic disease.  Largest lesion is in the left frontal lobe and measures up to 1.3 cm with a large amount of surrounding edema and 9 mm rightward midline shift.  The other enhancing lesion was 4 mm in the right occipital lobe with a small amount of surrounding edema.  No acute or chronic hemorrhage.   10/07/2021 Progression   Headache after colonoscopy, progressive difficulty using her right arm.  Progressively worsening of speech. Stage IV lung cancer with brain metastasis.  started on Dexamethasone.  He was seen by neurosurgery and radiation oncology.  Recommendation is to proceed with SRS.  Patient was discharged with dexamethasone   10/16/2021 Imaging   PET scan showed postradiation changes in the right supra hilar lung without evidence of local recurrence.  No evidence of lung cancer recurrence or metastatic disease.  Single focus of intense metabolic activity associated with posterior left 11th rib likely secondary to recent fall/posttraumatic metabolic changes.  Frontal  uptake in the left frontal lobe  corresponding to the known metastatic lesion in the brain   10/29/2021 -  Radiation Therapy   Brain Radiation [facility]   12/10/2021 Imaging   Brain MRI: Continued interval decrease in size of a left frontal lobe metastasis, now measuring 10 mm. Moderate surrounding edema has also decreased.  3-4 mm metastasis within the right occipital lobe, not significantly changed in size. There is now a small focus of central non enhancement within this lesion. Increased edema. 3 mm enhancing metastasis within the high left parietal lobe. In retrospect, this was likely present as a subtle punctate focus of enhancement on the prior brain MRI of 10/22/2021.   01/18/2022 Imaging   CT chest abdomen pelvis w contrast 1. Stable post treatment changes in the right upper lobe medially with dense radiation fibrosis but no findings to suggest local recurrent tumor. 2. Significant progression of ill-defined ground-glass opacity, interstitial thickening and airspace nodularity in the right upper lobe and upper aspect of the right lower lobe. Findings could reflect an inflammatory or atypical infectious process, drug induced pneumonitis or interstitial spread of tumor.3. No mediastinal or hilar mass or adenopathy. 4. No findings for abdominal/pelvic metastatic disease or osseous metastatic disease.   02/19/2022 Imaging   MRI brain w wo contrast showed Three metastatic deposits in the brain appear smaller. Decreased edema in the left parietal lobe. No new lesions    04/04/2022 Imaging   CT chest w contrast 1. Stable radiation changes in  the right perihilar region without evidence of local recurrence. 2. Interval improvement in the patchy ground-glass opacities previously demonstrated in both lungs, most consistent with a resolving infectious or inflammatory process (including immunotherapy-related pneumonitis). 3. No evidence of metastatic disease.   05/14/2022 Miscellaneous   He had a mechanical fall and fractured his left  elbow. xrays showed a left elbow fracture  05/26/22 seen by orthopedic surgeon, cast applied.    05/27/2022 Imaging   MRI brain w wo contrast 1. All three known brain metastases have enlarged since August, with increasing regional edema. And there is a small new left anterior cerebellar 4-5 mm metastasis (was questionably punctate on the May MRI). This constellation could reflect a combination of True Progression (Left Cerebellar lesion) and pseudoprogression/radionecrosis (3 existing mets).    2. No significant intracranial mass effect. 3. Underlying moderate cerebral white matter signal changes.   05/30/2022 Imaging   CT chest abdomen pelvis w contrast  1. 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 There is a stable small right pleural effusion, but no definitive findings to suggest residual or recurrent disease on today's examination. 2. No signs of metastatic disease in the abdomen or pelvis.  3. Aortic atherosclerosis, in addition to two-vessel coronary artery disease. Please note that although the presence of coronary artery calcium documents the presence of coronary artery disease, the severity of this disease and any potential stenosis cannot be assessed on this non-gated CT examination. Assessment for potential risk factor modification, dietary therapy or pharmacologic therapy may be warranted, if clinically indicated. 4. Hepatic steatosis. 5. Colonic diverticulosis without evidence of acute diverticulitis at this time. 6. Incompletely imaged but apparently occluded vascular graft in the superior aspect of the right anterior thigh. Referral to vascular specialist for further clinical evaluation should be considered if clinically appropriate. 7. Additional incidental findings, as above.    Today he feels well.Intermittent headache, is better. denies any focal  deficits. chronic SOB, no chest pain wheezing, cough.  + cast applied to his left elbow.  + right lower extremity weakness improved.  + slurred speech, stable.   Review of Systems  Constitutional:  Positive for fatigue. Negative for appetite change, chills, fever and unexpected weight change.  HENT:   Negative for hearing loss and voice change.   Eyes:  Negative for eye problems and icterus.  Respiratory:  Negative for chest tightness, cough and shortness of breath.   Cardiovascular:  Negative for chest pain and leg swelling.  Gastrointestinal:  Negative for abdominal distention and abdominal pain.  Endocrine: Negative for hot flashes.  Genitourinary:  Negative for difficulty urinating, dysuria and frequency.   Musculoskeletal:  Negative for arthralgias.       Left elbow fracture  Skin:  Negative for itching and rash.  Neurological:  Positive for extremity weakness, headaches and speech difficulty. Negative for light-headedness and numbness.  Hematological:  Negative for adenopathy. Does not bruise/bleed easily.  Psychiatric/Behavioral:  Negative for confusion.       MEDICAL HISTORY:  Past Medical History:  Diagnosis Date   Allergy    Cancer ([place])    lung   COPD (chronic obstructive pulmonary disease) (HCC)    Dyspnea    former smoker. only doe   History of kidney stones    Hypertension    Peripheral vascular disease ([place])    Pre-diabetes    Sleep apnea     SURGICAL HISTORY: Past Surgical History:  Procedure Laterality Date   ANTERIOR CERVICAL DECOMP/DISCECTOMY FUSION N/A 03/14/2020   Procedure: ANTERIOR CERVICAL DECOMPRESSION/DISCECTOMY FUSION 3 LEVELS C4-7;  Surgeon: Meade Maw, MD;  Location: [facility] ORS;  Service: Neurosurgery;  Laterality: N/A;   BACK SURGERY  2011   CENTRAL LINE INSERTION Right 09/10/2016   Procedure: CENTRAL LINE INSERTION;  Surgeon: Katha Cabal, MD;  Location: [facility] ORS;  Service: Vascular;  Laterality: Right;   CERVICAL FUSION      C4-c7 on Mar 14, 2020   COLONOSCOPY  2013 ?   COLONOSCOPY N/A 09/09/2021   Procedure: COLONOSCOPY;  Surgeon: [name], DO;  Location: [facility] ENDOSCOPY;  Service: Gastroenterology;  Laterality: N/A;   CORONARY ANGIOPLASTY     ESOPHAGOGASTRODUODENOSCOPY N/A 09/09/2021   Procedure: ESOPHAGOGASTRODUODENOSCOPY (EGD);  Surgeon: [name], DO;  Location: [facility] ENDOSCOPY;  Service: Gastroenterology;  Laterality: N/A;   FEMORAL-POPLITEAL BYPASS GRAFT Right 09/10/2016   Procedure: BYPASS GRAFT FEMORAL-POPLITEAL ARTERY ( BELOW KNEE );  Surgeon: Katha Cabal, MD;  Location: [facility] ORS;  Service: Vascular;  Laterality: Right;   FEMORAL-TIBIAL BYPASS GRAFT Right 03/23/2020   Procedure: BYPASS GRAFT RIGHT FEMORAL- DISTAL TIBIAL ARTERY WITH DISTAL FLOW GRAFT;  Surgeon: Katha Cabal, MD;  Location: [facility] ORS;  Service: Vascular;  Laterality: Right;   LEFT HEART CATH AND CORONARY ANGIOGRAPHY Left 05/16/2021   Procedure: LEFT HEART CATH AND CORONARY ANGIOGRAPHY;  Surgeon: Andrez Grime, MD;  Location: [place] CV LAB;  Service: Cardiovascular;  Laterality: Left;   LOWER EXTREMITY ANGIOGRAPHY Right 11/18/2016   Procedure: Lower Extremity Angiography;  Surgeon: Katha Cabal, MD;  Location: [place] CV LAB;  Service: Cardiovascular;  Laterality: Right;   LOWER EXTREMITY ANGIOGRAPHY Right 12/09/2016   Procedure: Lower Extremity Angiography;  Surgeon: Katha Cabal, MD;  Location: [place] CV LAB;  Service: Cardiovascular;  Laterality: Right;   LOWER EXTREMITY ANGIOGRAPHY Right 11/15/2019   Procedure: LOWER EXTREMITY ANGIOGRAPHY;  Surgeon: Katha Cabal, MD;  Location: [place] CV LAB;  Service: Cardiovascular;  Laterality: Right;   LOWER EXTREMITY ANGIOGRAPHY Right 12/01/2019   Procedure: Lower Extremity Angiography;  Surgeon: Algernon Huxley, MD;  Location: [place] CV LAB;  Service: Cardiovascular;  Laterality: Right;   LOWER EXTREMITY ANGIOGRAPHY Right  12/02/2019   Procedure: LOWER EXTREMITY ANGIOGRAPHY;  Surgeon: Katha Cabal, MD;  Location: [place] CV LAB;  Service: Cardiovascular;  Laterality: Right;   LOWER EXTREMITY ANGIOGRAPHY Right 03/23/2020   Procedure: Lower Extremity Angiography;  Surgeon: Katha Cabal, MD;  Location: [place] CV LAB;  Service: Cardiovascular;  Laterality: Right;   LOWER EXTREMITY ANGIOGRAPHY Right 09/25/2021   Procedure: Lower Extremity Angiography;  Surgeon: Katha Cabal, MD;  Location: [place] CV LAB;  Service: Cardiovascular;  Laterality: Right;   LOWER EXTREMITY ANGIOGRAPHY Right 09/26/2021   Procedure: Lower Extremity Angiography;  Surgeon: Algernon Huxley, MD;  Location: [place] CV LAB;  Service: Cardiovascular;  Laterality: Right;   LOWER EXTREMITY ANGIOGRAPHY Right 04/08/2022   Procedure: Lower Extremity Angiography;  Surgeon: Katha Cabal, MD;  Location: [place] CV LAB;  Service: Cardiovascular;  Laterality: Right;   LOWER EXTREMITY INTERVENTION  11/18/2016   Procedure: Lower Extremity Intervention;  Surgeon: Katha Cabal, MD;  Location: [place] CV LAB;  Service: Cardiovascular;;   PERIPHERAL VASCULAR CATHETERIZATION Right 01/02/2015   Procedure: Lower Extremity Angiography;  Surgeon: Katha Cabal, MD;  Location: [place] CV LAB;  Service: Cardiovascular;  Laterality: Right;   PERIPHERAL VASCULAR CATHETERIZATION  Right 06/18/2015   Procedure: Lower Extremity Angiography;  Surgeon: Algernon Huxley, MD;  Location: [place] CV LAB;  Service: Cardiovascular;  Laterality: Right;   PERIPHERAL VASCULAR CATHETERIZATION  06/18/2015   Procedure: Lower Extremity Intervention;  Surgeon: Algernon Huxley, MD;  Location: [place] CV LAB;  Service: Cardiovascular;;   PERIPHERAL VASCULAR CATHETERIZATION N/A 10/09/2015   Procedure: Abdominal Aortogram w/Lower Extremity;  Surgeon: Katha Cabal, MD;  Location: [place] CV LAB;  Service: Cardiovascular;   Laterality: N/A;   PERIPHERAL VASCULAR CATHETERIZATION  10/09/2015   Procedure: Lower Extremity Intervention;  Surgeon: Katha Cabal, MD;  Location: [place] CV LAB;  Service: Cardiovascular;;   PERIPHERAL VASCULAR CATHETERIZATION Right 10/10/2015   Procedure: Lower Extremity Angiography;  Surgeon: Algernon Huxley, MD;  Location: [place] CV LAB;  Service: Cardiovascular;  Laterality: Right;   PERIPHERAL VASCULAR CATHETERIZATION Right 10/10/2015   Procedure: Lower Extremity Intervention;  Surgeon: Algernon Huxley, MD;  Location: [place] CV LAB;  Service: Cardiovascular;  Laterality: Right;   PERIPHERAL VASCULAR CATHETERIZATION Right 12/03/2015   Procedure: Lower Extremity Angiography;  Surgeon: Algernon Huxley, MD;  Location: [place] CV LAB;  Service: Cardiovascular;  Laterality: Right;   PERIPHERAL VASCULAR CATHETERIZATION Right 12/04/2015   Procedure: Lower Extremity Angiography;  Surgeon: Algernon Huxley, MD;  Location: [place] CV LAB;  Service: Cardiovascular;  Laterality: Right;   PERIPHERAL VASCULAR CATHETERIZATION  12/04/2015   Procedure: Lower Extremity Intervention;  Surgeon: Algernon Huxley, MD;  Location: [place] CV LAB;  Service: Cardiovascular;;   PERIPHERAL VASCULAR CATHETERIZATION Right 06/30/2016   Procedure: Lower Extremity Angiography;  Surgeon: Algernon Huxley, MD;  Location: [place] CV LAB;  Service: Cardiovascular;  Laterality: Right;   PERIPHERAL VASCULAR CATHETERIZATION Right 06/25/2016   Procedure: Lower Extremity Angiography;  Surgeon: Katha Cabal, MD;  Location: [place] CV LAB;  Service: Cardiovascular;  Laterality: Right;   PERIPHERAL VASCULAR CATHETERIZATION Right 07/01/2016   Procedure: Lower Extremity Angiography;  Surgeon: Katha Cabal, MD;  Location: [place] CV LAB;  Service: Cardiovascular;  Laterality: Right;   PERIPHERAL VASCULAR CATHETERIZATION  07/01/2016   Procedure: Lower Extremity Intervention;  Surgeon: Katha Cabal, MD;  Location: [place] CV LAB;  Service: Cardiovascular;;   PERIPHERAL VASCULAR CATHETERIZATION Right 08/01/2016   Procedure: Lower Extremity Angiography;  Surgeon: Katha Cabal, MD;  Location: [place] CV LAB;  Service: Cardiovascular;  Laterality: Right;   PORTA CATH INSERTION N/A 10/05/2020   Procedure: PORTA CATH INSERTION;  Surgeon: Katha Cabal, MD;  Location: [place] CV LAB;  Service: Cardiovascular;  Laterality: N/A;   stent placement in right leg Right    VIDEO BRONCHOSCOPY N/A 09/14/2020   Procedure: VIDEO BRONCHOSCOPY WITH FLUORO;  Surgeon: Tyler Pita, MD;  Location: [facility] ORS;  Service: Cardiopulmonary;  Laterality: N/A;   VIDEO BRONCHOSCOPY WITH ENDOBRONCHIAL ULTRASOUND N/A 09/14/2020   Procedure: VIDEO BRONCHOSCOPY WITH ENDOBRONCHIAL ULTRASOUND;  Surgeon: Tyler Pita, MD;  Location: [facility] ORS;  Service: Cardiopulmonary;  Laterality: N/A;    SOCIAL HISTORY: Social History   Socioeconomic History   Marital status: Single    Spouse name: Not on file   Number of children: Not on file   Years of education: Not on file   Highest education level: Not on file  Occupational History   Occupation: unemployed  Tobacco Use   Smoking status: Former    Packs/day: 2.00    Years: 38.00    Total pack  years: 76.00    Types: Cigarettes    Quit date: 12/20/2014    Years since quitting: 7.5   Smokeless tobacco: Never   Tobacco comments:    quit smoking in 2017 most smoked 2   Vaping Use   Vaping Use: Former  Substance and Sexual Activity   Alcohol use: Not Currently   Drug use: No   Sexual activity: Yes  Other Topics Concern   Not on file  Social History Narrative   Not on file   Social Determinants of Health   Financial Resource Strain: Not on file  Food Insecurity: Not on file  Transportation Needs: Not on file  Physical Activity: Not on file  Stress: Not on file  Social Connections: Not on file  Intimate Partner Violence: Not  on file    FAMILY HISTORY: Family History  Problem Relation Age of Onset   Diabetes Mother    Hypertension Mother    Heart murmur Mother    Leukemia Mother    Throat cancer Maternal Grandmother    Colon cancer Neg Hx    Breast cancer Neg Hx     ALLERGIES:  has No Known Allergies.  MEDICATIONS:  Current Outpatient Medications  Medication Sig Dispense Refill   aspirin EC 81 MG tablet Take 81 mg by mouth daily. Swallow whole.     clopidogrel (PLAVIX) 75 MG tablet TAKE 1 TABLET BY MOUTH  DAILY 100 tablet 2   dexamethasone (DECADRON) 4 MG tablet Take 1 tablet (4 mg total) by mouth daily. 30 tablet 0   famotidine (PEPCID) 20 MG tablet Take 20 mg by mouth daily.     Glycopyrrolate-Formoterol (BEVESPI AEROSPHERE) 9-4.8 MCG/ACT AERO Inhale 2 puffs into the lungs 2 (two) times daily. 32.1 g 1   levETIRAcetam (KEPPRA) 500 MG tablet Take 1 tablet (500 mg total) by mouth 2 (two) times daily. 180 tablet 3   levothyroxine (SYNTHROID) 100 MCG tablet TAKE 1 TABLET BY MOUTH ONCE DAILY BEFORE BREAKFAST 30 tablet 0   simvastatin (ZOCOR) 40 MG tablet Take 40 mg by mouth at bedtime.     oxyCODONE (OXY IR/ROXICODONE) 5 MG immediate release tablet Take 5 mg by mouth every 4 (four) hours as needed. (Patient not taking: Reported on 07/04/2022)     No current facility-administered medications for this visit.     PHYSICAL EXAMINATION: ECOG PERFORMANCE STATUS: 1 - Symptomatic but completely ambulatory Vitals:   07/04/22 0941  BP: (!) 155/83  Pulse: 73  Resp: 18  Temp: 97.7 F (36.5 C)  SpO2: 100%   Filed Weights   07/04/22 0941  Weight: 211 lb 1.6 oz (95.8 kg)    Physical Exam Constitutional:      General: He is not in acute distress.    Appearance: He is not diaphoretic.     Comments: Patient walks with a cane  HENT:     Head: Normocephalic and atraumatic.     Nose: Nose normal.     Mouth/Throat:     Pharynx: No oropharyngeal exudate.  Eyes:     General: No scleral icterus.     Pupils: Pupils are equal, round, and reactive to light.  Cardiovascular:     Rate and Rhythm: Normal rate and regular rhythm.     Heart sounds: No murmur heard. Pulmonary:     Effort: Pulmonary effort is normal. No respiratory distress.     Breath sounds: No rales.  Chest:     Chest wall: No tenderness.  Abdominal:  General: There is no distension.     Palpations: Abdomen is soft.     Tenderness: There is no abdominal tenderness.  Musculoskeletal:     Cervical back: Normal range of motion and neck supple.     Comments: Left upper extremity in sling.  + left hand swelling.  +decreased right lower extremity strength, normal right upper and left lower extremity strength.   Skin:    General: Skin is warm and dry.     Findings: No erythema.  Neurological:     Mental Status: He is alert and oriented to person, place, and time.     Cranial Nerves: No cranial nerve deficit.     Motor: No abnormal muscle tone.     Coordination: Coordination normal.  Psychiatric:        Mood and Affect: Affect normal.      LABORATORY DATA:  I have reviewed the data as listed    Latest Ref Rng & Units 07/04/2022    9:01 AM 06/20/2022    9:53 AM 06/06/2022    8:15 AM  CBC  WBC 4.0 - 10.5 K/uL 7.1  11.2  4.4   Hemoglobin 13.0 - 17.0 g/dL 12.8  12.8  12.6   Hematocrit 39.0 - 52.0 % 40.0  39.7  39.0   Platelets 150 - 400 K/uL 177  217  273       Latest Ref Rng & Units 07/04/2022    9:01 AM 06/20/2022    9:53 AM 06/06/2022    8:15 AM  CMP  Glucose 70 - 99 mg/dL 98  180  137   BUN 8 - 23 mg/dL _0 Creatinine 0.61 - 1.24 mg/dL 1.07  0.96  1.04   Sodium 135 - 145 mmol/L 139  138  139   Potassium 3.5 - 5.1 mmol/L 4.2  3.7  3.6   Chloride 98 - 111 mmol/L 102  103  106   CO2 22 - 32 mmol/L _1 Calcium 8.9 - 10.3 mg/dL 9.0  8.9  9.3   Total Protein 6.5 - 8.1 g/dL 6.8  6.5  7.1   Total Bilirubin 0.3 - 1.2 mg/dL 0.6  0.4  0.4   Alkaline Phos 38 - 126 U/L 55  65  77   AST 15 -  41 U/L _2 ALT 0 - 44 U/L _3 RADIOGRAPHIC STUDIES: I have personally reviewed the radiological images as listed and agreed with the findings in the report. NM Bone Scan Whole Body  Result Date: 06/10/2022 CLINICAL DATA:  Squamous cell carcinoma of the lung EXAM: NUCLEAR MEDICINE WHOLE BODY BONE SCAN TECHNIQUE: Whole body anterior and posterior images were obtained approximately 3 hours after intravenous injection of radiopharmaceutical. RADIOPHARMACEUTICALS:  20.51 mCi Technetium-81mMDP IV COMPARISON:  None Available Radiographic correlation: CT chest abdomen pelvis 05/28/2022 FINDINGS: Significant uptake at LEFT elbow corresponding to known fracture. Due to limitations of elbow positioning, LEFT hand projects over LEFT iliac wing on anterior view. Uptake at adjacent posterior LEFT tenth eleventh twelfth ribs, consistent with healing fractures. Uptake at costovertebral junction LEFT seventh rib corresponding to degenerative changes on CT. No worrisome sites of tracer uptake to suggest osseous metastatic disease. Portions of pelvis obscured by retained tracer within urinary bladder. Expected urinary tract and soft tissue distribution of tracer otherwise identified. IMPRESSION: Degenerative and posttraumatic type uptake as above.  No scintigraphic evidence of osseous metastatic disease. Electronically Signed   By: Lavonia Dana M.D.   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 Follow-up study. * Tracking Code: BO * EXAM: CT CHEST, ABDOMEN, AND PELVIS WITH CONTRAST TECHNIQUE: Multidetector CT imaging of the chest, abdomen and pelvis was performed following the standard protocol during bolus administration of intravenous contrast. RADIATION DOSE REDUCTION: This exam was performed according  to the departmental dose-optimization program which includes automated exposure control, adjustment of the mA and/or kV according to patient size and/or use of iterative reconstruction technique. CONTRAST:  147m OMNIPAQUE IOHEXOL 300 MG/ML  SOLN COMPARISON:  Chest CT 04/04/2022. CT of the chest, abdomen and pelvis 01/17/2022. FINDINGS: CT CHEST FINDINGS Cardiovascular: Heart size is normal. There is no significant pericardial fluid, thickening or pericardial calcification. There is aortic atherosclerosis, as well as atherosclerosis of the great vessels of the mediastinum and the coronary arteries, including calcified atherosclerotic plaque in the left anterior descending and right coronary arteries. Mediastinum/Nodes: No pathologically enlarged mediastinal or hilar lymph nodes. Esophagus is unremarkable in appearance. No axillary lymphadenopathy. Lungs/Pleura: Chronic mass-like architectural distortion and volume loss is noted in the perihilar aspect of the right mid to upper lung, similar to prior examinations, most compatible with areas of chronic postradiation mass-like fibrosis. The surrounding regions of ground-glass attenuation and septal thickening seen on prior examinations persist, but have decreased compared to the prior study, most likely to reflect resolving postradiation pneumonitis. Trace right pleural effusion lying dependently, similar to the prior study. No left pleural effusion. No new acute consolidative airspace disease. No definite suspicious appearing pulmonary nodules or masses are noted. Musculoskeletal: Orthopedic fixation hardware in the lower cervical spine incompletely imaged. There are no aggressive appearing lytic or blastic lesions noted in the visualized portions of the skeleton. CT ABDOMEN PELVIS FINDINGS Hepatobiliary: Diffuse low attenuation throughout the hepatic parenchyma, indicative of a background of hepatic steatosis. No suspicious cystic or solid hepatic lesions. No intra  or extrahepatic biliary ductal dilatation. Gallbladder is normal in appearance. Pancreas: No pancreatic mass. No pancreatic ductal dilatation. No pancreatic or peripancreatic fluid collections or inflammatory changes. Spleen: Unremarkable. Adrenals/Urinary Tract: Bilateral kidneys and bilateral adrenal glands are normal in appearance. No hydroureteronephrosis. Urinary bladder is unremarkable in appearance. Stomach/Bowel: The appearance of the stomach is normal. No pathologic dilatation of small bowel or colon. Numerous colonic diverticuli are noted, particularly in the sigmoid colon, without surrounding inflammatory changes to suggest an acute diverticulitis at this time. Normal appendix. Vascular/Lymphatic: Atherosclerosis throughout the abdominal aorta and pelvic vasculature. Postoperative changes in the upper right thigh incompletely imaged, with what appears to be a vascular graft bifurcating in the anterior aspect of the upper right thigh, with both limbs of the graft apparently occluded on today's examination. Reproductive: Prostate gland and seminal vesicles are unremarkable in appearance. Other: No significant volume of ascites.  No pneumoperitoneum. Musculoskeletal: Status post PLIF at L5-S1. There are no aggressive appearing lytic or blastic lesions noted in the visualized portions of the skeleton. IMPRESSION: 1. 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 There is a stable small right pleural effusion, but no definitive findings to suggest residual or recurrent disease on today's examination. 2. No signs of metastatic disease in the abdomen or pelvis.  3. Aortic atherosclerosis, in addition to two-vessel coronary artery disease. Please note that although the presence of coronary artery calcium documents the presence of coronary artery disease, the severity of this disease and any  potential stenosis cannot be assessed on this non-gated CT examination. Assessment for potential risk factor modification, dietary therapy or pharmacologic therapy may be warranted, if clinically indicated. 4. Hepatic steatosis. 5. Colonic diverticulosis without evidence of acute diverticulitis at this time. 6. Incompletely imaged but apparently occluded vascular graft in the superior aspect of the right anterior thigh. Referral to vascular specialist for further clinical evaluation should be considered if clinically appropriate. 7. Additional incidental findings, as above. Electronically Signed   By: Vinnie Langton M.D.   On: 05/30/2022 08:04   MR BRAIN W WO CONTRAST  Result Date: 05/27/2022 CLINICAL DATA:  66 year old male with metastatic lung cancer. Three known brain metastases. Status post brain radiation, reportedly SRS. Restaging. EXAM: MRI HEAD WITHOUT AND WITH CONTRAST TECHNIQUE: Multiplanar, multiecho pulse sequences of the brain and surrounding structures were obtained without and with intravenous contrast. CONTRAST:  2m GADAVIST GADOBUTROL 1 MMOL/ML IV SOLN COMPARISON:  Brain MRI 02/19/2022 and earlier. FINDINGS: Brain: Larger rim enhancing metastasis of the left parietal lobe since August, now 10 mm diameter (3 mm previously. See series 18, image 144. Regional FLAIR hyperintensity is mild but also progressed. Dominant left frontal operculum metastasis has also increased in size and heterogeneously enhancing on series 18, image 104 up to 21 mm diameter now (previously 6 mm). Significant progression of confluent surrounding T2 and FLAIR hyperintensity compatible with vasogenic edema. Much of the enhancing mass is seen to be T2 hypointense on series 10, image 17 and there are some diffusion signal changes. Only minor regional mass effect including on the left lateral ventricle. Progression also of the small right occipital lobe white matter metastasis now 10 mm diameter (previously 4 mm) and  increased, moderate regional T2 and FLAIR hyperintense vasogenic edema (series 15, image 21). No significant regional mass effect. New small 4-5 mm solid enhancing left anterior cerebellar metastasis on series 18, image 34. Mild regional T2 and FLAIR hyperintense edema without mass effect (series 15, image 10). This lesion might have been punctate on 097/67/3419 uncertain. No other enhancing metastasis identified. No superimposed restricted diffusion suggestive of acute infarction. No midline shift, ventriculomegaly, extra-axial collection or acute intracranial hemorrhage. Cervicomedullary junction and pituitary are within normal limits. Widely scattered and patchy additional bilateral cerebral white matter nonspecific T2 and FLAIR hyperintensity appears stable since May. Vascular: Major intracranial vascular flow voids are stable. Dominant distal left vertebral artery. Major dural venous sinuses are enhancing and appear to be patent. Skull and upper cervical spine: Visualized bone marrow signal is within normal limits. Negative visible cervical spine. Sinuses/Orbits: Stable and negative. Other: Visible internal auditory structures appear normal. Negative visible scalp and face. IMPRESSION: 1. All three known brain metastases have enlarged since August, with increasing regional edema. And there is a small new left anterior cerebellar 4-5 mm metastasis (was questionably punctate on the May MRI). This constellation could reflect a combination of True Progression (Left Cerebellar lesion) and pseudoprogression/radionecrosis (3 existing mets). 2. No significant intracranial mass effect. 3. Underlying moderate cerebral white matter signal changes. Electronically Signed   By: HGenevie AnnM.D.   On: 05/27/2022 05:45   CT ELBOW LEFT WO CONTRAST  Result Date: 05/21/2022 CLINICAL DATA:  Fall, follow-up left elbow x-rays EXAM: CT OF THE UPPER LEFT EXTREMITY WITHOUT CONTRAST TECHNIQUE: Multidetector CT imaging of  the upper left  extremity was performed according to the standard protocol. RADIATION DOSE REDUCTION: This exam was performed according to the departmental dose-optimization program which includes automated exposure control, adjustment of the mA and/or kV according to patient size and/or use of iterative reconstruction technique. COMPARISON:  Left elbow radiograph 05/14/2022 FINDINGS: Bones/Joint/Cartilage There is a minimally displaced supracondylar fracture of the humerus with associated moderate-sized joint effusion. There is up to 4 mm cortical offset at the medial margin. No condylar split component. Ligaments Suboptimally assessed by CT. Muscles and Tendons No acute myotendinous abnormality or intramuscular collection on noncontrast CT. Soft tissues There is soft tissue swelling along the elbow. IMPRESSION: Minimally displaced supracondylar fracture of the distal humerus. Moderate-sized joint effusion. Electronically Signed   By: Maurine Simmering M.D.   On: 05/21/2022 13:26   DG Elbow Complete Left  Result Date: 05/14/2022 CLINICAL DATA:  [place] today with elbow pain EXAM: LEFT ELBOW - COMPLETE 3+ VIEW COMPARISON:  None Available. FINDINGS: Supracondylar fracture of the humerus without angulation or displacement. Joint effusion is present. No fracture of the radius or ulna. IMPRESSION: Supracondylar fracture of the humerus without angulation or displacement. Electronically Signed   By: Nelson Chimes M.D.   On: 05/14/2022 14:32   VAS [place] ABI WITH/WO TBI  Result Date: 05/12/2022  LOWER EXTREMITY DOPPLER STUDY Patient Name:  NICKO DAHER  Date of Exam:   05/05/2022 Medical Rec #: [id number]            Accession #:    [id number] Date of Birth: [birth year]           Patient Gender: M Patient Age:   9 years Exam Location:  [facility] Procedure:      VAS [place] ABI WITH/WO TBI Referring Phys: --------------------------------------------------------------------------------  Indications: Claudication, peripheral  artery disease, and Routine f/u. High Risk Factors: Hyperlipidemia. Other Factors: ASO s/p multiple interventions and bypass.                11/15/2019 Right thrombectomy of Fempop insitu graft with added                stent throughout.                 12/01/2019: Aortogram and Selctive Right Lower Extremity                Angiogram. 6 mg of TPA delivered in a lysis catheterfrom the                Origin of the Right Femoral -Popliteal bypass down the proximal                ATA with placement of the lysis.                 12/02/2019: PTA of the Right Posterior Tibial Artery. PTA of the                Right Peroneal Artery. Mechanical Thrombectomy of the Right                Femoral -Popliteal Bypass graft. Mechanical Thrombectomy of the                Right Tibioperoneal Trunk and Posterior Tibial Artery.                 09/25/2021: Right Lower Extremity Angiography third order                catheter placement.  PTA of the proximal peroneal and                tibioperoneal trunk to 2.5 mm with an ultra score balloon. PTA of                the Femoral Artery and the proximal anstomosis of the bypass                graft to 4 mm with an ultra score balloon. Administration of 5 mg                of tpa throughout the entire bypass and in the peroneal Artery.                Initiation of continuous tpa infusion Right lower Extremity for                limb salvage. Insertion of a triple lumen catheter Left CFV with                [place] and fluoroscopic guidance.                 09/26/2021: Right LE Angiogram. PTA of the Right Posterior Tibial                Artery with 2.5 mm diameter by 30 cm length angioplasty balloon.                PTA of the distal bypass anastomosis and distal bypass graft                analogous to the Popliteal artery with 4 mm diameter by 8 cm                length Lutonix drug coated angioplasty balloon.                04/08/2022 right thrombectomy of the Deep profunda.  Vascular Interventions:  Hx of multiple right lower extremity PTA's/stents.                         09/10/2016 Right femoral to below the knee popliteal                         artery bypass graft placement. 5/01 Right bypass graft &                         mid popliteal artery PTA & coil embolectomy of large                         branch of bypass graft. 12/09/16 Right distal bypass                         graft PTA/stent and right posterior tibial artery PTA.                         03/23/2020 Rt PTA thrombectomy. Rt Profunda fem artery to                         PTA bypass. Comparison Study: 03/10/2022 Performing Technologist: Concha Norway RVT  Examination Guidelines: A complete evaluation includes at minimum, Doppler waveform signals and systolic blood pressure reading at the level of bilateral brachial, anterior tibial, and posterior tibial arteries, when vessel segments  are accessible. Bilateral testing is considered an integral part of a complete examination. Photoelectric Plethysmograph (PPG) waveforms and toe systolic pressure readings are included as required and additional duplex testing as needed. Limited examinations for reoccurring indications may be performed as noted.  ABI Findings: +---------+------------------+-----+----------+--------+ Right    Rt Pressure (mmHg)IndexWaveform  Comment  +---------+------------------+-----+----------+--------+ Brachial 135                                       +---------+------------------+-----+----------+--------+ ATA                             absent             +---------+------------------+-----+----------+--------+ PTA      72                0.53 monophasic         +---------+------------------+-----+----------+--------+ PERO     88                0.65 monophasic         +---------+------------------+-----+----------+--------+ Great Toe36                0.27 Abnormal           +---------+------------------+-----+----------+--------+  +---------+------------------+-----+---------+-------+ Left     Lt Pressure (mmHg)IndexWaveform Comment +---------+------------------+-----+---------+-------+ Brachial 135                                     +---------+------------------+-----+---------+-------+ ATA      160               1.19 triphasic        +---------+------------------+-----+---------+-------+ PTA      146               1.08 triphasic        +---------+------------------+-----+---------+-------+ Great Toe155               1.15 Normal           +---------+------------------+-----+---------+-------+ +-------+-----------+-----------+------------+------------+ ABI/TBIToday's ABIToday's TBIPrevious ABIPrevious TBI +-------+-----------+-----------+------------+------------+ Right  .65        .27        .28         .00          +-------+-----------+-----------+------------+------------+ Left   1.19       1.15       1.21        .96          +-------+-----------+-----------+------------+------------+ Right ABIs and TBIs appear increased compared to prior study on 02/2022.  Summary: Right: Resting right ankle-brachial index indicates moderate right lower extremity arterial disease. The right toe-brachial index is abnormal. Left: Resting left ankle-brachial index is within normal range. The left toe-brachial index is normal. *See table(s) above for measurements and observations.  Electronically signed by [name] MD on 05/12/2022 at 1:04:36 PM.    Final    PERIPHERAL VASCULAR CATHETERIZATION  Result Date: 04/08/2022 See surgical note for result.

## 2022-07-04 NOTE — Patient Instructions (Signed)
[facility] AT [place]  Discharge Instructions: Thank you for choosing [facility] to provide your oncology and hematology care.  If you have a lab appointment with the [facility], please go directly to the [facility] and check in at the registration area.  Wear comfortable clothing and clothing appropriate for easy access to any Portacath or PICC line.   We strive to give you quality time with your provider. You may need to reschedule your appointment if you arrive late (15 or more minutes).  Arriving late affects you and other patients whose appointments are after yours.  Also, if you miss three or more appointments without notifying the office, you may be dismissed from the clinic at the provider's discretion.      For prescription refill requests, have your pharmacy contact our office and allow 72 hours for refills to be completed.    Today you received the following chemotherapy and/or immunotherapy agents Durvalumab       To help prevent nausea and vomiting after your treatment, we encourage you to take your nausea medication as directed.  BELOW ARE SYMPTOMS THAT SHOULD BE REPORTED IMMEDIATELY: *FEVER GREATER THAN 100.4 F (38 C) OR HIGHER *CHILLS OR SWEATING *NAUSEA AND VOMITING THAT IS NOT CONTROLLED WITH YOUR NAUSEA MEDICATION *UNUSUAL SHORTNESS OF BREATH *UNUSUAL BRUISING OR BLEEDING *URINARY PROBLEMS (pain or burning when urinating, or frequent urination) *BOWEL PROBLEMS (unusual diarrhea, constipation, pain near the anus) TENDERNESS IN MOUTH AND THROAT WITH OR WITHOUT PRESENCE OF ULCERS (sore throat, sores in mouth, or a toothache) UNUSUAL RASH, SWELLING OR PAIN  UNUSUAL VAGINAL DISCHARGE OR ITCHING   Items with * indicate a potential emergency and should be followed up as soon as possible or go to the Emergency Department if any problems should occur.  Please show the CHEMOTHERAPY ALERT CARD or IMMUNOTHERAPY ALERT CARD at check-in to  the Emergency Department and triage nurse.  Should you have questions after your visit or need to cancel or reschedule your appointment, please contact [facility] [place] AT [place]  [phone] and follow the prompts.  Office hours are 8:00 a.m. to 4:30 p.m. Monday - Friday. Please note that voicemails left after 4:00 p.m. may not be returned until the following business day.  We are closed weekends and major holidays. You have access to a nurse at all times for urgent questions. Please call the main number to the clinic [phone] and follow the prompts.  For any non-urgent questions, you may also contact your provider using MyChart. We now offer e-Visits for anyone 34 and older to request care online for non-urgent symptoms. For details visit [email or web address].   Also download the MyChart app! Go to the app store, search "MyChart", open the app, select [facility], and log in with your MyChart username and password.  Masks are optional in the cancer centers. If you would like for your care team to wear a mask while they are taking care of you, please let them know. For doctor visits, patients may have with them one support person who is at least 64 years old. At this time, visitors are not allowed in the infusion area.

## 2022-07-09 ENCOUNTER — Telehealth: Payer: Self-pay | Admitting: Pulmonary Disease

## 2022-07-09 NOTE — Telephone Encounter (Signed)
The reason why we had to switch was because his insurance was not covering [place] anymore.  If his insurance does cover the [place] now we can certainly switch him back.

## 2022-07-09 NOTE — Telephone Encounter (Signed)
I spoke with the patient. He said he is scheduled to see you in February and would like to talk to you about switching his back to the [facility] at that appointment. He said he can not tell a difference in taking the Bevespi and he does not like having to use it twice a day. He just wanted you to know in advance. He will continue to use the Bevespi until his appointment.  Dr. Patsey Berthold, this is just an FYI.

## 2022-07-18 ENCOUNTER — Ambulatory Visit
Admission: RE | Admit: 2022-07-18 | Discharge: 2022-07-18 | Disposition: A | Discharge status: Home / Self Care | Payer: Medicare Other | Source: Ambulatory Visit | Attending: Internal Medicine | Admitting: Internal Medicine

## 2022-07-18 ENCOUNTER — Encounter: Payer: Self-pay | Admitting: Oncology

## 2022-07-18 ENCOUNTER — Inpatient Hospital Stay: Payer: Medicare Other

## 2022-07-18 ENCOUNTER — Inpatient Hospital Stay (HOSPITAL_BASED_OUTPATIENT_CLINIC_OR_DEPARTMENT_OTHER): Payer: Medicare Other | Admitting: Oncology

## 2022-07-18 VITALS — BP 156/87 | HR 79 | Temp 98.4°F | Resp 18 | Wt 211.1 lb

## 2022-07-18 DIAGNOSIS — C7931 Secondary malignant neoplasm of brain: Secondary | ICD-10-CM | POA: Insufficient documentation

## 2022-07-18 DIAGNOSIS — E032 Hypothyroidism due to medicaments and other exogenous substances: Secondary | ICD-10-CM | POA: Diagnosis not present

## 2022-07-18 DIAGNOSIS — C3491 Malignant neoplasm of unspecified part of right bronchus or lung: Secondary | ICD-10-CM

## 2022-07-18 DIAGNOSIS — I739 Peripheral vascular disease, unspecified: Secondary | ICD-10-CM

## 2022-07-18 DIAGNOSIS — Z5112 Encounter for antineoplastic immunotherapy: Secondary | ICD-10-CM | POA: Diagnosis not present

## 2022-07-18 LAB — COMPREHENSIVE METABOLIC PANEL
ALT: 49 U/L — ABNORMAL HIGH (ref 0–44)
AST: 24 U/L (ref 15–41)
Albumin: 3.8 g/dL (ref 3.5–5.0)
Alkaline Phosphatase: 50 U/L (ref 38–126)
Anion gap: 8 (ref 5–15)
BUN: 27 mg/dL — ABNORMAL HIGH (ref 8–23)
CO2: 28 mmol/L (ref 22–32)
Calcium: 9 mg/dL (ref 8.9–10.3)
Chloride: 102 mmol/L (ref 98–111)
Creatinine, Ser: 1.08 mg/dL (ref 0.61–1.24)
GFR, Estimated: >60 mL/min (ref >60)
Glucose, Bld: 100 mg/dL — ABNORMAL HIGH (ref 70–99)
Potassium: 3.9 mmol/L (ref 3.5–5.1)
Sodium: 138 mmol/L (ref 135–145)
Total Bilirubin: 0.5 mg/dL (ref 0.3–1.2)
Total Protein: 6.7 g/dL (ref 6.5–8.1)

## 2022-07-18 LAB — CBC WITH DIFFERENTIAL/PLATELET
Abs Immature Granulocytes: 0.09 10*3/uL — ABNORMAL HIGH (ref 0.00–0.07)
Basophils Absolute: 0 10*3/uL (ref 0.0–0.1)
Basophils Relative: 0 %
Eosinophils Absolute: 0 10*3/uL (ref 0.0–0.5)
Eosinophils Relative: 0 %
HCT: 40.6 % (ref 39.0–52.0)
Hemoglobin: 13.2 g/dL (ref 13.0–17.0)
Immature Granulocytes: 1 %
Lymphocytes Relative: 8 %
Lymphs Abs: 0.7 10*3/uL (ref 0.7–4.0)
MCH: 27.3 pg (ref 26.0–34.0)
MCHC: 32.5 g/dL (ref 30.0–36.0)
MCV: 83.9 fL (ref 80.0–100.0)
Monocytes Absolute: 0.6 10*3/uL (ref 0.1–1.0)
Monocytes Relative: 7 %
Neutro Abs: 6.6 10*3/uL (ref 1.7–7.7)
Neutrophils Relative %: 84 %
Platelets: 166 10*3/uL (ref 150–400)
RBC: 4.84 MIL/uL (ref 4.22–5.81)
RDW: 14.6 % (ref 11.5–15.5)
WBC: 8 10*3/uL (ref 4.0–10.5)
nRBC: 0 % (ref 0.0–0.2)

## 2022-07-18 MED ORDER — HEPARIN SOD (PORK) LOCK FLUSH 100 UNIT/ML IV SOLN
500.0000 [IU] | Freq: Once | INTRAVENOUS | Status: AC | PRN
  Administered 2022-07-18: 500 [IU]
  Filled 2022-07-18: qty 5

## 2022-07-18 MED ORDER — SODIUM CHLORIDE 0.9% FLUSH
10.0000 mL | INTRAVENOUS | Status: DC | PRN
  Administered 2022-07-18: 10 mL
  Filled 2022-07-18: qty 10

## 2022-07-18 MED ORDER — SODIUM CHLORIDE 0.9 % IV SOLN
10.0000 mg/kg | Freq: Once | INTRAVENOUS | Status: AC
  Administered 2022-07-18: 1000 mg via INTRAVENOUS
  Filled 2022-07-18: qty 20

## 2022-07-18 MED ORDER — GADOBUTROL 1 MMOL/ML IV SOLN
9.0000 mL | Freq: Once | INTRAVENOUS | Status: AC | PRN
  Administered 2022-07-18: 9 mL via INTRAVENOUS

## 2022-07-18 MED ORDER — SODIUM CHLORIDE 0.9 % IV SOLN
Freq: Once | INTRAVENOUS | Status: AC
  Filled 2022-07-18: qty 250

## 2022-07-18 NOTE — Assessment & Plan Note (Signed)
Continue Plavix and follow up with vascular surgery. He is off Eliquis CT showed likely  occluded vascular graft in the superior aspect of the right anterior thigh.  I have previously discussed with Dr.Schnier

## 2022-07-18 NOTE — Assessment & Plan Note (Signed)
Status post radiation.  02/19/22 MRI showed decreased brain lesion size and edema.  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 small new left anterior cerebellar 4-5 mm metastasis  He was seen by Dr.Vaslow, continue Dexamethsone  4mg  daily, repeat MRI  The small left anterior cerebellar lesion is likely a new metastatic lesion, Dr.Vasclow recommend radiation. I have communicated with Dr.Chrystal, he recommends repeat MRI brain and decide.

## 2022-07-18 NOTE — Assessment & Plan Note (Signed)
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  Repeat CT scan showed improvement of patchy ground-glass opacities.  Reviewed and discussed with patient CT chest abdomen pelvis showed no progression.  Labs reviewed and discussed with patient. Proceed with Durvalumab

## 2022-07-18 NOTE — Assessment & Plan Note (Signed)
Synthroid 100 mcg daily

## 2022-07-18 NOTE — Assessment & Plan Note (Signed)
Recommend patient to take Calcium 1200mg  daily.

## 2022-07-18 NOTE — Progress Notes (Signed)
Hematology/Oncology Progress note [phone] [phone]) [phone]    ASSESSMENT & PLAN:   Cancer Staging  Squamous cell carcinoma of right lung [facility]) Staging form: Lung, AJCC 8th Edition - Clinical stage from 09/20/2020: Stage IV (cT1b, cN2, cM1) - Signed by [name], MD on 12/13/2021   Squamous cell carcinoma of right lung ([place]) [name] was previously held due to pneumonitis.  Repeat CT scan showed improvement of patchy ground-glass opacities.  Reviewed and discussed with patient CT chest abdomen pelvis showed no progression.  Labs reviewed and discussed with patient. Proceed with [name]  Hypothyroidism due to medication Synthroid 100 mcg daily   Malignant neoplasm metastatic to brain [facility]) Status post radiation.  02/19/22 MRI showed decreased brain lesion size and edema.  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 small new left anterior cerebellar 4-5 mm metastasis  He was seen by Dr.Vaslow, continue Dexamethsone  50m daily, repeat MRI  The small left anterior cerebellar lesion is likely a new metastatic lesion, Dr.Vasclow recommend radiation. I have communicated with Dr.Chrystal, he recommends repeat MRI brain and decide.    PVD (peripheral vascular disease) (HCC) Continue Plavix and follow up with vascular surgery. He is off Eliquis CT showed likely  occluded vascular graft in the superior aspect of the right anterior thigh.  I have previously discussed with Dr.Schnier    Hypocalcemia Recommend patient to take Calcium 12018mdaily.     Orders Placed This Encounter  Procedures   CBC with Differential    Standing Status:   Future    Standing Expiration Date:   08/16/2023   Comprehensive metabolic panel    Standing Status:   Future    Standing Expiration Date:   08/16/2023   TSH    Standing Status:   Future    Standing Expiration Date:   08/16/2023   CBC with Differential     Standing Status:   Future    Standing Expiration Date:   08/30/2023   Comprehensive metabolic panel    Standing Status:   Future    Standing Expiration Date:   08/30/2023     Follow up lab MD 2 weeks [name] All questions were answered. The patient knows to call the clinic with any problems, questions or concerns.  ZhEarlie ServerMD, PhD [facility] 07/18/2022      CHIEF COMPLAINTS/REASON FOR VISIT:  Follow up for lung cancer  HISTORY OF PRESENTING ILLNESS:  Joshua [name] a [age].o. male who has above history reviewed by me today presents for follow up visit for Stage IV lung squamous cell carcinoma. Oncology History  Squamous cell carcinoma of right lung ([place] 09/18/2020 Imaging   PET scan showed right upper lobe nodule maximal SUV 11.5.  Hypermetabolic cluster right paratracheal adenopathy with maximum SUV up to 12.  No findings of metastatic disease to the neck/abdomen/pelvis or skeleton.   09/20/2020 Initial Diagnosis   Squamous cell carcinoma of right lung (HCC) NGS showed PD-L1 TPS 95%, LRP1B [id number] E545K, RB1 c1422-2A>T, RIT1 T83_ A84del, STK11 [id number] R248W, TMB 19.5mut70m, MS stable.    09/20/2020 Cancer Staging   Staging form: Lung, AJCC 8th Edition - Clinical stage from 09/20/2020: Stage IV (cT1b, cN2, cM1) - Signed by Mcgwire Dasaro, ZEarlie Serveron 12/13/2021   10/03/2020 Imaging   MRI brain with and without contrast showed no evidence of intracranial metastatic disease. Well-defined round lesion within the left parotid tail measuring approximately  2.3 x 1.8 x 1.8 cm   10/09/2020 - 11/27/2020 Radiation Therapy   concurrent chemoradiation   10/10/2020 - 10/31/2020 Chemotherapy   10/10/2020 concurrent chemoradiation.  carboplatin AUC of 2 and Taxol 45 mg/m2 x 4.  Additional chemotherapy was held due to thrombocytopenia, neutropenia       12/31/2020 - 03/21/2022 Chemotherapy   Maintenance  [name] q14d      12/31/2020 -  Chemotherapy   Patient  is on Treatment Plan : LUNG [name] (10) q14d     03/28/2021 Imaging   CT without contrast showed new linear right perihilar consolidation with associated traction bronchiectasis.  Likely postradiation changes.  Additional new peribronchial vascular/nodular groundglass opacities with areas of consolidation or seen primarily in the right upper and lower lobes, more distant from expected radiation fields. 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  Per my discussion with pulmonology Dr. 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     05/09/2021 Imaging   Repeat CT chest without contrast showed evolving radiation changes in the right perihilar region likely accounting for interval increase to thickening of the posterior wall of the right upper lobe bronchus.  The more peripheral right lung groundglass opacities seen on most recent study have generally improved with exception of one peripherally right upper lobe which appears new.  These are likely inflammatory.  No findings highly suspicious for local recurrence.   05/30/2021 - 01/10/2022 Chemotherapy   [name] q14d     10/07/2021 Imaging   MRI brain with and without contrast showed 2 contrast-enhancing lesions, most consistent with metastatic disease.  Largest lesion is in the left frontal lobe and measures up to 1.3 cm with a large amount of surrounding edema and 9 mm rightward midline shift.  The other enhancing lesion was 4 mm in the right occipital lobe with a small amount of surrounding edema.  No acute or chronic hemorrhage.   10/07/2021 Progression   Headache after colonoscopy, progressive difficulty using her right arm.  Progressively worsening of speech. Stage IV lung cancer with brain metastasis.  started on Dexamethasone.  He was seen by neurosurgery and radiation oncology.  Recommendation is to proceed with  SRS.  Patient was discharged with dexamethasone   10/16/2021 Imaging   PET scan showed postradiation changes in the right supra hilar lung without evidence of local recurrence.  No evidence of lung cancer recurrence or metastatic disease.  Single focus of intense metabolic activity associated with posterior left 11th rib likely secondary to recent fall/posttraumatic metabolic changes.  Frontal  uptake in the left frontal lobe corresponding to the known metastatic lesion in the brain   10/29/2021 -  Radiation Therapy   Brain Radiation [facility]   12/10/2021 Imaging   Brain MRI: Continued interval decrease in size of a left frontal lobe metastasis, now measuring 10 mm. Moderate surrounding edema has also decreased.  3-4 mm metastasis within the right occipital lobe, not significantly changed in size. There is now a small focus of central non enhancement within this lesion. Increased edema. 3 mm enhancing metastasis within the high left parietal lobe. In retrospect, this was likely present as a subtle punctate focus of enhancement on the prior brain MRI of 10/22/2021.   01/18/2022 Imaging   CT chest abdomen pelvis w contrast 1. Stable post treatment changes in the right upper lobe medially with dense radiation fibrosis but no findings to suggest local recurrent  tumor. 2. Significant progression of ill-defined ground-glass opacity, interstitial thickening and airspace nodularity in the right upper lobe and upper aspect of the right lower lobe. Findings could reflect an inflammatory or atypical infectious process, drug induced pneumonitis or interstitial spread of tumor.3. No mediastinal or hilar mass or adenopathy. 4. No findings for abdominal/pelvic metastatic disease or osseous metastatic disease.   02/19/2022 Imaging   MRI brain w wo contrast showed Three metastatic deposits in the brain appear smaller. Decreased edema in the left parietal lobe. No new lesions    04/04/2022 Imaging   CT chest w contrast 1.  Stable radiation changes in the right perihilar region without evidence of local recurrence. 2. Interval improvement in the patchy ground-glass opacities previously demonstrated in both lungs, most consistent with a resolving infectious or inflammatory process (including immunotherapy-related pneumonitis). 3. No evidence of metastatic disease.   05/14/2022 Miscellaneous   He had a mechanical fall and fractured his left elbow. xrays showed a left elbow fracture  05/26/22 seen by orthopedic surgeon, cast applied.    05/27/2022 Imaging   MRI brain w wo contrast 1. All three known brain metastases have enlarged since August, with increasing regional edema. And there is a small new left anterior cerebellar 4-5 mm metastasis (was questionably punctate on the May MRI). This constellation could reflect a combination of True Progression (Left Cerebellar lesion) and pseudoprogression/radionecrosis (3 existing mets).    2. No significant intracranial mass effect. 3. Underlying moderate cerebral white matter signal changes.   05/30/2022 Imaging   CT chest abdomen pelvis w contrast  1. 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 There is a stable small right pleural effusion, but no definitive findings to suggest residual or recurrent disease on today's examination. 2. No signs of metastatic disease in the abdomen or pelvis.  3. Aortic atherosclerosis, in addition to two-vessel coronary artery disease. Please note that although the presence of coronary artery calcium documents the presence of coronary artery disease, the severity of this disease and any potential stenosis cannot be assessed on this non-gated CT examination. Assessment for potential risk factor modification, dietary therapy or pharmacologic therapy may be warranted, if clinically indicated. 4. Hepatic steatosis. 5. Colonic  diverticulosis without evidence of acute diverticulitis at this time. 6. Incompletely imaged but apparently occluded vascular graft in the superior aspect of the right anterior thigh. Referral to vascular specialist for further clinical evaluation should be considered if clinically appropriate. 7. Additional incidental findings, as above.    Today he feels well.Intermittent headache, is better. denies any focal deficits. chronic SOB, no chest pain wheezing, cough.  +  left elbow cast was removed.  + right lower extremity weakness improved.  + slurred speech, stable.   Review of Systems  Constitutional:  Positive for fatigue. Negative for appetite change, chills, fever and unexpected weight change.  HENT:   Negative for hearing loss and voice change.   Eyes:  Negative for eye problems and icterus.  Respiratory:  Negative for chest tightness, cough and shortness of breath.   Cardiovascular:  Negative for chest pain and leg swelling.  Gastrointestinal:  Negative for abdominal distention and abdominal pain.  Endocrine: Negative for hot flashes.  Genitourinary:  Negative for difficulty urinating, dysuria and frequency.   Musculoskeletal:  Negative for arthralgias.  Skin:  Negative for itching and rash.  Neurological:  Positive for extremity weakness. Negative for headaches, light-headedness, numbness and speech difficulty.  Hematological:  Negative for adenopathy. Does not bruise/bleed easily.  Psychiatric/Behavioral:  Negative for confusion.       MEDICAL HISTORY:  Past Medical History:  Diagnosis Date   Allergy    Cancer ([place])    lung   COPD (chronic obstructive pulmonary disease) (HCC)    Dyspnea    former smoker. only doe   History of kidney stones    Hypertension    Peripheral vascular disease ([place])    Pre-diabetes    Sleep apnea     SURGICAL HISTORY: Past Surgical History:  Procedure Laterality Date   ANTERIOR CERVICAL DECOMP/DISCECTOMY FUSION N/A 03/14/2020    Procedure: ANTERIOR CERVICAL DECOMPRESSION/DISCECTOMY FUSION 3 LEVELS C4-7;  Surgeon: Meade Maw, MD;  Location: [facility] ORS;  Service: Neurosurgery;  Laterality: N/A;   BACK SURGERY  2011   CENTRAL LINE INSERTION Right 09/10/2016   Procedure: CENTRAL LINE INSERTION;  Surgeon: Katha Cabal, MD;  Location: [facility] ORS;  Service: Vascular;  Laterality: Right;   CERVICAL FUSION     C4-c7 on Mar 14, 2020   COLONOSCOPY  2013 ?   COLONOSCOPY N/A 09/09/2021   Procedure: COLONOSCOPY;  Surgeon: [name], DO;  Location[facility] ENDOSCOPY;  Service: Gastroenterology;  Laterality: N/A;   CORONARY ANGIOPLASTY     ESOPHAGOGASTRODUODENOSCOPY N/A 09/09/2021   Procedure: ESOPHAGOGASTRODUODENOSCOPY (EGD);  Surgeon: [name], DO;  Location[facility]COPY;  Service: Gastroenterology;  Laterality: N/A;   FEMORAL-POPLITEAL BYPASS GRAFT Right 09/10/2016   Procedure: BYPASS GRAFT FEMORAL-POPLITEAL ARTERY ( BELOW KNEE );  Surgeon: Katha Cabal, MD;  Location: [facility] ORS;  Service: Vascular;  Laterality: Right;   FEMORAL-TIBIAL BYPASS GRAFT Right 03/23/2020   Procedure: BYPASS GRAFT RIGHT FEMORAL- DISTAL TIBIAL ARTERY WITH DISTAL FLOW GRAFT;  Surgeon: Katha Cabal, MD;  Location: [facility] ORS;  Service: Vascular;  Laterality: Right;   LEFT HEART CATH AND CORONARY ANGIOGRAPHY Left 05/16/2021   Procedure: LEFT HEART CATH AND CORONARY ANGIOGRAPHY;  Surgeon: Andrez Grime, MD;  Location: [place] CV LAB;  Service: Cardiovascular;  Laterality: Left;   LOWER EXTREMITY ANGIOGRAPHY Right 11/18/2016   Procedure: Lower Extremity Angiography;  Surgeon: Katha Cabal, MD;  Location: [place] CV LAB;  Service: Cardiovascular;  Laterality: Right;   LOWER EXTREMITY ANGIOGRAPHY Right 12/09/2016   Procedure: Lower Extremity Angiography;  Surgeon: Katha Cabal, MD;  Location: [place] CV LAB;  Service: Cardiovascular;  Laterality: Right;   LOWER EXTREMITY ANGIOGRAPHY Right  11/15/2019   Procedure: LOWER EXTREMITY ANGIOGRAPHY;  Surgeon: Katha Cabal, MD;  Location: [place] CV LAB;  Service: Cardiovascular;  Laterality: Right;   LOWER EXTREMITY ANGIOGRAPHY Right 12/01/2019   Procedure: Lower Extremity Angiography;  Surgeon: Algernon Huxley, MD;  Location: [place] CV LAB;  Service: Cardiovascular;  Laterality: Right;   LOWER EXTREMITY ANGIOGRAPHY Right 12/02/2019   Procedure: LOWER EXTREMITY ANGIOGRAPHY;  Surgeon: Katha Cabal, MD;  Location: [place] CV LAB;  Service: Cardiovascular;  Laterality: Right;   LOWER EXTREMITY ANGIOGRAPHY Right 03/23/2020   Procedure: Lower Extremity Angiography;  Surgeon: Katha Cabal, MD;  Location: [place] CV LAB;  Service: Cardiovascular;  Laterality: Right;   LOWER EXTREMITY ANGIOGRAPHY Right 09/25/2021   Procedure: Lower Extremity Angiography;  Surgeon: Katha Cabal, MD;  Location: [place] CV LAB;  Service: Cardiovascular;  Laterality: Right;   LOWER EXTREMITY ANGIOGRAPHY Right 09/26/2021   Procedure: Lower Extremity Angiography;  Surgeon: Algernon Huxley, MD;  Location: [place] [facility] LAB;  Service: Cardiovascular;  Laterality: Right;   LOWER EXTREMITY ANGIOGRAPHY Right 04/08/2022   Procedure: Lower Extremity Angiography;  Surgeon: Katha Cabal, MD;  Location: [place] CV LAB;  Service: Cardiovascular;  Laterality: Right;   LOWER EXTREMITY INTERVENTION  11/18/2016   Procedure: Lower Extremity Intervention;  Surgeon: Katha Cabal, MD;  Location: [place] CV LAB;  Service: Cardiovascular;;   PERIPHERAL VASCULAR CATHETERIZATION Right 01/02/2015   Procedure: Lower Extremity Angiography;  Surgeon: Katha Cabal, MD;  Location: [place] [facility] LAB;  Service: Cardiovascular;  Laterality: Right;   PERIPHERAL VASCULAR CATHETERIZATION Right 06/18/2015   Procedure: Lower Extremity Angiography;  Surgeon: Algernon Huxley, MD;  Location: [place] CV LAB;  Service: Cardiovascular;   Laterality: Right;   PERIPHERAL VASCULAR CATHETERIZATION  06/18/2015   Procedure: Lower Extremity Intervention;  Surgeon: Algernon Huxley, MD;  Location: [place] [facility] LAB;  Service: Cardiovascular;;   PERIPHERAL VASCULAR CATHETERIZATION N/A 10/09/2015   Procedure: Abdominal Aortogram w/Lower Extremity;  Surgeon: Katha Cabal, MD;  Location: [place] [facility];  Service: Cardiovascular;  Laterality: N/A;   PERIPHERAL VASCULAR CATHETERIZATION  10/09/2015   Procedure: Lower Extremity Intervention;  Surgeon: Katha Cabal, MD;  Location: [place] [facility] LAB;  Service: Cardiovascular;;   PERIPHERAL VASCULAR CATHETERIZATION Right 10/10/2015   Procedure: Lower Extremity Angiography;  Surgeon: Algernon Huxley, MD;  Location: [place] CV LAB;  Service: Cardiovascular;  Laterality: Right;   PERIPHERAL VASCULAR CATHETERIZATION Right 10/10/2015   Procedure: Lower Extremity Intervention;  Surgeon: Algernon Huxley, MD;  Location: [place] [facility];  Service: Cardiovascular;  Laterality: Right;   PERIPHERAL VASCULAR CATHETERIZATION Right 12/03/2015   Procedure: Lower Extremity Angiography;  Surgeon: Algernon Huxley, MD;  Location: [place] [facility] LAB;  Service: Cardiovascular;  Laterality: Right;   PERIPHERAL VASCULAR CATHETERIZATION Right 12/04/2015   Procedure: Lower Extremity Angiography;  Surgeon: Algernon Huxley, MD;  Location: [place] [facility] LAB;  Service: Cardiovascular;  Laterality: Right;   PERIPHERAL VASCULAR CATHETERIZATION  12/04/2015   Procedure: Lower Extremity Intervention;  Surgeon: Algernon Huxley, MD;  Location: [place] [facility] LAB;  Service: Cardiovascular;;   PERIPHERAL VASCULAR CATHETERIZATION Right 06/30/2016   Procedure: Lower Extremity Angiography;  Surgeon: Algernon Huxley, MD;  Location: [place] [facility] LAB;  Service: Cardiovascular;  Laterality: Right;   PERIPHERAL VASCULAR CATHETERIZATION Right 06/25/2016   Procedure: Lower Extremity Angiography;  Surgeon: Katha Cabal, MD;  Location:  [place] CV LAB;  Service: Cardiovascular;  Laterality: Right;   PERIPHERAL VASCULAR CATHETERIZATION Right 07/01/2016   Procedure: Lower Extremity Angiography;  Surgeon: Katha Cabal, MD;  Location: [place] [facility] LAB;  Service: Cardiovascular;  Laterality: Right;   PERIPHERAL VASCULAR CATHETERIZATION  07/01/2016   Procedure: Lower Extremity Intervention;  Surgeon: Katha Cabal, MD;  Location: [place] CV LAB;  Service: Cardiovascular;;   PERIPHERAL VASCULAR CATHETERIZATION Right 08/01/2016   Procedure: Lower Extremity Angiography;  Surgeon: Katha Cabal, MD;  Location: [place] [facility] LAB;  Service: Cardiovascular;  Laterality: Right;   PORTA CATH INSERTION N/A 10/05/2020   Procedure: PORTA CATH INSERTION;  Surgeon: Katha Cabal, MD;  Location: [place] [facility];  Service: Cardiovascular;  Laterality: N/A;   stent placement in right leg Right    VIDEO BRONCHOSCOPY N/A 09/14/2020   Procedure: VIDEO BRONCHOSCOPY WITH FLUORO;  Surgeon: Tyler Pita, MD;  Location: [facility] ORS;  Service: Cardiopulmonary;  Laterality: N/A;   VIDEO BRONCHOSCOPY WITH ENDOBRONCHIAL ULTRASOUND N/A 09/14/2020   Procedure: VIDEO BRONCHOSCOPY  WITH ENDOBRONCHIAL ULTRASOUND;  Surgeon: Tyler Pita, MD;  Location: [facility] ORS;  Service: Cardiopulmonary;  Laterality: N/A;    SOCIAL HISTORY: Social History   Socioeconomic History   Marital status: Single    Spouse name: Not on file   Number of children: Not on file   Years of education: Not on file   Highest education level: Not on file  Occupational History   Occupation: unemployed  Tobacco Use   Smoking status: Former    Packs/day: 2.00    Years: 38.00    Total pack years: 76.00    Types: Cigarettes    Quit date: 12/20/2014    Years since quitting: 7.5   Smokeless tobacco: Never   Tobacco comments:    quit smoking in 2017 most smoked 2   Vaping Use   Vaping Use: Former  Substance and Sexual Activity   Alcohol use: Not  Currently   Drug use: No   Sexual activity: Yes  Other Topics Concern   Not on file  Social History Narrative   Not on file   Social Determinants of Health   Financial Resource Strain: Not on file  Food Insecurity: Not on file  Transportation Needs: Not on file  Physical Activity: Not on file  Stress: Not on file  Social Connections: Not on file  Intimate Partner Violence: Not on file    FAMILY HISTORY: Family History  Problem Relation Age of Onset   Diabetes Mother    Hypertension Mother    Heart murmur Mother    Leukemia Mother    Throat cancer Maternal Grandmother    Colon cancer Neg Hx    Breast cancer Neg Hx     ALLERGIES:  has No Known Allergies.  MEDICATIONS:  Current Outpatient Medications  Medication Sig Dispense Refill   aspirin EC 81 MG tablet Take 81 mg by mouth daily. Swallow whole.     Calcium Carbonate-Vitamin D (CALCIUM 600+D PO) Take 2 tablets by mouth daily.     clopidogrel (PLAVIX) 75 MG tablet TAKE 1 TABLET BY MOUTH  DAILY 100 tablet 2   dexamethasone (DECADRON) 4 MG tablet Take 1 tablet (4 mg total) by mouth daily. 30 tablet 0   famotidine (PEPCID) 20 MG tablet Take 20 mg by mouth daily.     Glycopyrrolate-Formoterol (BEVESPI AEROSPHERE) 9-4.8 MCG/ACT AERO Inhale 2 puffs into the lungs 2 (two) times daily. 32.1 g 1   levETIRAcetam (KEPPRA) 500 MG tablet Take 1 tablet (500 mg total) by mouth 2 (two) times daily. 180 tablet 3   levothyroxine (SYNTHROID) 100 MCG tablet TAKE 1 TABLET BY MOUTH ONCE DAILY BEFORE BREAKFAST 30 tablet 0   simvastatin (ZOCOR) 40 MG tablet Take 40 mg by mouth at bedtime.     No current facility-administered medications for this visit.   Facility-Administered Medications Ordered in Other Visits  Medication Dose Route Frequency Provider Last Rate Last Admin   gadobutrol (GADAVIST) 1 MMOL/ML injection 9 mL  9 mL Intravenous Once PRN Vaslow, Acey Lav, MD         PHYSICAL EXAMINATION: ECOG PERFORMANCE STATUS: 1 -  Symptomatic but completely ambulatory Vitals:   07/18/22 1012  BP: (!) 156/87  Pulse: 79  Resp: 18  Temp: 98.4 F (36.9 C)  SpO2: 100%   Filed Weights   07/18/22 1012  Weight: 211 lb 1.6 oz (95.8 kg)    Physical Exam Constitutional:      General: He is not in acute distress.    Appearance:  He is not diaphoretic.     Comments: Patient walks with a cane  HENT:     Head: Normocephalic and atraumatic.     Nose: Nose normal.     Mouth/Throat:     Pharynx: No oropharyngeal exudate.  Eyes:     General: No scleral icterus.    Pupils: Pupils are equal, round, and reactive to light.  Cardiovascular:     Rate and Rhythm: Normal rate and regular rhythm.     Heart sounds: No murmur heard. Pulmonary:     Effort: Pulmonary effort is normal. No respiratory distress.     Breath sounds: No rales.  Chest:     Chest wall: No tenderness.  Abdominal:     General: There is no distension.     Palpations: Abdomen is soft.     Tenderness: There is no abdominal tenderness.  Musculoskeletal:     Cervical back: Normal range of motion and neck supple.  Skin:    General: Skin is warm and dry.     Findings: No erythema.  Neurological:     Mental Status: He is alert and oriented to person, place, and time.     Cranial Nerves: No cranial nerve deficit.     Motor: No abnormal muscle tone.     Coordination: Coordination normal.  Psychiatric:        Mood and Affect: Affect normal.      LABORATORY DATA:  I have reviewed the data as listed    Latest Ref Rng & Units 07/18/2022   10:00 AM 07/04/2022    9:01 AM 06/20/2022    9:53 AM  CBC  WBC 4.0 - 10.5 K/uL 8.0  7.1  11.2   Hemoglobin 13.0 - 17.0 g/dL 13.2  12.8  12.8   Hematocrit 39.0 - 52.0 % 40.6  40.0  39.7   Platelets 150 - 400 K/uL 166  177  217       Latest Ref Rng & Units 07/18/2022   10:00 AM 07/04/2022    9:01 AM 06/20/2022    9:53 AM  CMP  Glucose 70 - 99 mg/dL 100  98  180   BUN 8 - 23 mg/dL _0 Creatinine 0.61  - 1.24 mg/dL 1.08  1.07  0.96   Sodium 135 - 145 mmol/L 138  139  138   Potassium 3.5 - 5.1 mmol/L 3.9  4.2  3.7   Chloride 98 - 111 mmol/L 102  102  103   CO2 22 - 32 mmol/L _1 Calcium 8.9 - 10.3 mg/dL 9.0  9.0  8.9   Total Protein 6.5 - 8.1 g/dL 6.7  6.8  6.5   Total Bilirubin 0.3 - 1.2 mg/dL 0.5  0.6  0.4   Alkaline Phos 38 - 126 U/L 50  55  65   AST 15 - 41 U/L _2 ALT 0 - 44 U/L 49  31  18      RADIOGRAPHIC STUDIES: I have personally reviewed the radiological images as listed and agreed with the findings in the report. NM Bone Scan Whole Body  Result Date: 06/10/2022 CLINICAL DATA:  Squamous cell carcinoma of the lung EXAM: NUCLEAR MEDICINE WHOLE BODY BONE SCAN TECHNIQUE: Whole body anterior and posterior images were obtained approximately 3 hours after intravenous injection of radiopharmaceutical. RADIOPHARMACEUTICALS:  20.51 mCi Technetium-63mMDP IV COMPARISON:  None Available Radiographic correlation: CT chest abdomen  pelvis 05/28/2022 FINDINGS: Significant uptake at LEFT elbow corresponding to known fracture. Due to limitations of elbow positioning, LEFT hand projects over LEFT iliac wing on anterior view. Uptake at adjacent posterior LEFT tenth eleventh twelfth ribs, consistent with healing fractures. Uptake at costovertebral junction LEFT seventh rib corresponding to degenerative changes on CT. No worrisome sites of tracer uptake to suggest osseous metastatic disease. Portions of pelvis obscured by retained tracer within urinary bladder. Expected urinary tract and soft tissue distribution of tracer otherwise identified. IMPRESSION: Degenerative and posttraumatic type uptake as above. No scintigraphic evidence of osseous metastatic disease. Electronically Signed   By: Lavonia Dana M.D.   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 Follow-up study. * Tracking Code: BO * EXAM: CT CHEST, ABDOMEN, AND PELVIS WITH CONTRAST TECHNIQUE: Multidetector CT imaging of the chest, abdomen and pelvis was performed following the standard protocol during bolus administration of intravenous contrast. RADIATION DOSE REDUCTION: This exam was performed according to the departmental dose-optimization program which includes automated exposure control, adjustment of the mA and/or kV according to patient size and/or use of iterative reconstruction technique. CONTRAST:  164m OMNIPAQUE IOHEXOL 300 MG/ML  SOLN COMPARISON:  Chest CT 04/04/2022. CT of the chest, abdomen and pelvis 01/17/2022. FINDINGS: CT CHEST FINDINGS Cardiovascular: Heart size is normal. There is no significant pericardial fluid, thickening or pericardial calcification. There is aortic atherosclerosis, as well as atherosclerosis of the great vessels of the mediastinum and the coronary arteries, including calcified atherosclerotic plaque in the left anterior descending and right coronary arteries. Mediastinum/Nodes: No pathologically enlarged mediastinal or hilar lymph nodes. Esophagus is unremarkable in appearance. No axillary lymphadenopathy. Lungs/Pleura: Chronic mass-like architectural distortion and volume loss is noted in the perihilar aspect of the right mid to upper lung, similar to prior examinations, most compatible with areas of chronic postradiation mass-like fibrosis. The surrounding regions of ground-glass attenuation and septal thickening seen on prior examinations persist, but have decreased compared to the prior study, most likely to reflect resolving postradiation pneumonitis. Trace right pleural effusion lying dependently, similar to the prior study. No left pleural effusion. No new acute consolidative airspace disease. No definite suspicious appearing pulmonary nodules or masses are noted. Musculoskeletal: Orthopedic  fixation hardware in the lower cervical spine incompletely imaged. There are no aggressive appearing lytic or blastic lesions noted in the visualized portions of the skeleton. CT ABDOMEN PELVIS FINDINGS Hepatobiliary: Diffuse low attenuation throughout the hepatic parenchyma, indicative of a background of hepatic steatosis. No suspicious cystic or solid hepatic lesions. No intra or extrahepatic biliary ductal dilatation. Gallbladder is normal in appearance. Pancreas: No pancreatic mass. No pancreatic ductal dilatation. No pancreatic or peripancreatic fluid collections or inflammatory changes. Spleen: Unremarkable. Adrenals/Urinary Tract: Bilateral kidneys and bilateral adrenal glands are normal in appearance. No hydroureteronephrosis. Urinary bladder is unremarkable in appearance. Stomach/Bowel: The appearance of the stomach is normal. No pathologic dilatation of small bowel or colon. Numerous colonic diverticuli are noted, particularly in the sigmoid colon, without surrounding inflammatory changes to suggest an acute diverticulitis at this time. Normal appendix. Vascular/Lymphatic: Atherosclerosis throughout the abdominal aorta and pelvic vasculature. Postoperative changes in the upper right thigh incompletely imaged, with what appears to be a vascular graft bifurcating in the anterior aspect of the upper right thigh, with both limbs of the graft apparently occluded on today's examination. Reproductive: Prostate  gland and seminal vesicles are unremarkable in appearance. Other: No significant volume of ascites.  No pneumoperitoneum. Musculoskeletal: Status post PLIF at L5-S1. There are no aggressive appearing lytic or blastic lesions noted in the visualized portions of the skeleton. IMPRESSION: 1. 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 There is a stable small right pleural effusion, but no  definitive findings to suggest residual or recurrent disease on today's examination. 2. No signs of metastatic disease in the abdomen or pelvis. 3. Aortic atherosclerosis, in addition to two-vessel coronary artery disease. Please note that although the presence of coronary artery calcium documents the presence of coronary artery disease, the severity of this disease and any potential stenosis cannot be assessed on this non-gated CT examination. Assessment for potential risk factor modification, dietary therapy or pharmacologic therapy may be warranted, if clinically indicated. 4. Hepatic steatosis. 5. Colonic diverticulosis without evidence of acute diverticulitis at this time. 6. Incompletely imaged but apparently occluded vascular graft in the superior aspect of the right anterior thigh. Referral to vascular specialist for further clinical evaluation should be considered if clinically appropriate. 7. Additional incidental findings, as above. Electronically Signed   By: Vinnie Langton M.D.   On: 05/30/2022 08:04   MR BRAIN W WO CONTRAST  Result Date: 05/27/2022 CLINICAL DATA:  66 year old male with metastatic lung cancer. Three known brain metastases. Status post brain radiation, reportedly SRS. Restaging. EXAM: MRI HEAD WITHOUT AND WITH CONTRAST TECHNIQUE: Multiplanar, multiecho pulse sequences of the brain and surrounding structures were obtained without and with intravenous contrast. CONTRAST:  27m GADAVIST GADOBUTROL 1 MMOL/ML IV SOLN COMPARISON:  Brain MRI 02/19/2022 and earlier. FINDINGS: Brain: Larger rim enhancing metastasis of the left parietal lobe since August, now 10 mm diameter (3 mm previously. See series 18, image 144. Regional FLAIR hyperintensity is mild but also progressed. Dominant left frontal operculum metastasis has also increased in size and heterogeneously enhancing on series 18, image 104 up to 21 mm diameter now (previously 6 mm). Significant progression of confluent surrounding T2  and FLAIR hyperintensity compatible with vasogenic edema. Much of the enhancing mass is seen to be T2 hypointense on series 10, image 17 and there are some diffusion signal changes. Only minor regional mass effect including on the left lateral ventricle. Progression also of the small right occipital lobe white matter metastasis now 10 mm diameter (previously 4 mm) and increased, moderate regional T2 and FLAIR hyperintense vasogenic edema (series 15, image 21). No significant regional mass effect. New small 4-5 mm solid enhancing left anterior cerebellar metastasis on series 18, image 34. Mild regional T2 and FLAIR hyperintense edema without mass effect (series 15, image 10). This lesion might have been punctate on 011/91/4782 uncertain. No other enhancing metastasis identified. No superimposed restricted diffusion suggestive of acute infarction. No midline shift, ventriculomegaly, extra-axial collection or acute intracranial hemorrhage. Cervicomedullary junction and pituitary are within normal limits. Widely scattered and patchy additional bilateral cerebral white matter nonspecific T2 and FLAIR hyperintensity appears stable since May. Vascular: Major intracranial vascular flow voids are stable. Dominant distal left vertebral artery. Major dural venous sinuses are enhancing and appear to be patent. Skull and upper cervical spine: Visualized bone marrow signal is within normal limits. Negative visible cervical spine. Sinuses/Orbits: Stable and negative. Other: Visible internal auditory structures appear normal. Negative visible scalp and face. IMPRESSION: 1. All three known brain metastases have enlarged since August, with increasing regional edema. And there is  a small new left anterior cerebellar 4-5 mm metastasis (was questionably punctate on the May MRI). This constellation could reflect a combination of True Progression (Left Cerebellar lesion) and pseudoprogression/radionecrosis (3 existing mets). 2. No  significant intracranial mass effect. 3. Underlying moderate cerebral white matter signal changes. Electronically Signed   By: Genevie Ann M.D.   On: 05/27/2022 05:45   CT ELBOW LEFT WO CONTRAST  Result Date: 05/21/2022 CLINICAL DATA:  Fall, follow-up left elbow x-rays EXAM: CT OF THE UPPER LEFT EXTREMITY WITHOUT CONTRAST TECHNIQUE: Multidetector CT imaging of the upper left extremity was performed according to the standard protocol. RADIATION DOSE REDUCTION: This exam was performed according to the departmental dose-optimization program which includes automated exposure control, adjustment of the mA and/or kV according to patient size and/or use of iterative reconstruction technique. COMPARISON:  Left elbow radiograph 05/14/2022 FINDINGS: Bones/Joint/Cartilage There is a minimally displaced supracondylar fracture of the humerus with associated moderate-sized joint effusion. There is up to 4 mm cortical offset at the medial margin. No condylar split component. Ligaments Suboptimally assessed by CT. Muscles and Tendons No acute myotendinous abnormality or intramuscular collection on noncontrast CT. Soft tissues There is soft tissue swelling along the elbow. IMPRESSION: Minimally displaced supracondylar fracture of the distal humerus. Moderate-sized joint effusion. Electronically Signed   By: Maurine Simmering M.D.   On: 05/21/2022 13:26   DG Elbow Complete Left  Result Date: 05/14/2022 CLINICAL DATA:  [place] today with elbow pain EXAM: LEFT ELBOW - COMPLETE 3+ VIEW COMPARISON:  None Available. FINDINGS: Supracondylar fracture of the humerus without angulation or displacement. Joint effusion is present. No fracture of the radius or ulna. IMPRESSION: Supracondylar fracture of the humerus without angulation or displacement. Electronically Signed   By: Nelson Chimes M.D.   On: 05/14/2022 14:32   VAS [place] ABI WITH/WO TBI  Result Date: 05/12/2022  LOWER EXTREMITY DOPPLER STUDY Patient Name:  Joshua [name]  Date of  Exam:   05/05/2022 Medical Rec #: [id number]            Accession #:    [id number] Date of Birth: 18-Feb-1956           Patient Gender: M Patient Age:   19 years Exam Location:  [facility] Procedure:      VAS [place] ABI WITH/WO TBI Referring Phys: --------------------------------------------------------------------------------  Indications: Claudication, peripheral artery disease, and Routine f/u. High Risk Factors: Hyperlipidemia. Other Factors: ASO s/p multiple interventions and bypass.                11/15/2019 Right thrombectomy of Fempop insitu graft with added                stent throughout.                 12/01/2019: Aortogram and Selctive Right Lower Extremity                Angiogram. 6 mg of TPA delivered in a lysis catheterfrom the                Origin of the Right Femoral -Popliteal bypass down the proximal                ATA with placement of the lysis.                 12/02/2019: PTA of the Right Posterior Tibial Artery. PTA of the  Right Peroneal Artery. Mechanical Thrombectomy of the Right                Femoral -Popliteal Bypass graft. Mechanical Thrombectomy of the                Right Tibioperoneal Trunk and Posterior Tibial Artery.                 09/25/2021: Right Lower Extremity Angiography third order                catheter placement. PTA of the proximal peroneal and                tibioperoneal trunk to 2.5 mm with an ultra score balloon. PTA of                the Femoral Artery and the proximal anstomosis of the bypass                graft to 4 mm with an ultra score balloon. Administration of 5 mg                of tpa throughout the entire bypass and in the peroneal Artery.                Initiation of continuous tpa infusion Right lower Extremity for                limb salvage. Insertion of a triple lumen catheter Left CFV with                [place] and fluoroscopic guidance.                 09/26/2021: Right LE Angiogram. PTA of the Right Posterior Tibial                 Artery with 2.5 mm diameter by 30 cm length angioplasty balloon.                PTA of the distal bypass anastomosis and distal bypass graft                analogous to the Popliteal artery with 4 mm diameter by 8 cm                length Lutonix drug coated angioplasty balloon.                04/08/2022 right thrombectomy of the Deep profunda.  Vascular Interventions: Hx of multiple right lower extremity PTA's/stents.                         09/10/2016 Right femoral to below the knee popliteal                         artery bypass graft placement. 5/01 Right bypass graft &                         mid popliteal artery PTA & coil embolectomy of large                         branch of bypass graft. 12/09/16 Right distal bypass                         graft PTA/stent and right posterior tibial artery PTA.  03/23/2020 Rt PTA thrombectomy. Rt Profunda fem artery to                         PTA bypass. Comparison Study: 03/10/2022 Performing Technologist: Concha Norway RVT  Examination Guidelines: A complete evaluation includes at minimum, Doppler waveform signals and systolic blood pressure reading at the level of bilateral brachial, anterior tibial, and posterior tibial arteries, when vessel segments are accessible. Bilateral testing is considered an integral part of a complete examination. Photoelectric Plethysmograph (PPG) waveforms and toe systolic pressure readings are included as required and additional duplex testing as needed. Limited examinations for reoccurring indications may be performed as noted.  ABI Findings: +---------+------------------+-----+----------+--------+ Right    Rt Pressure (mmHg)IndexWaveform  Comment  +---------+------------------+-----+----------+--------+ Brachial 135                                       +---------+------------------+-----+----------+--------+ ATA                             absent              +---------+------------------+-----+----------+--------+ PTA      72                0.53 monophasic         +---------+------------------+-----+----------+--------+ PERO     88                0.65 monophasic         +---------+------------------+-----+----------+--------+ Great Toe36                0.27 Abnormal           +---------+------------------+-----+----------+--------+ +---------+------------------+-----+---------+-------+ Left     Lt Pressure (mmHg)IndexWaveform Comment +---------+------------------+-----+---------+-------+ Brachial 135                                     +---------+------------------+-----+---------+-------+ ATA      160               1.19 triphasic        +---------+------------------+-----+---------+-------+ PTA      146               1.08 triphasic        +---------+------------------+-----+---------+-------+ Great Toe155               1.15 Normal           +---------+------------------+-----+---------+-------+ +-------+-----------+-----------+------------+------------+ ABI/TBIToday's ABIToday's TBIPrevious ABIPrevious TBI +-------+-----------+-----------+------------+------------+ Right  .65        .27        .28         .00          +-------+-----------+-----------+------------+------------+ Left   1.19       1.15       1.21        .96          +-------+-----------+-----------+------------+------------+ Right ABIs and TBIs appear increased compared to prior study on 02/2022.  Summary: Right: Resting right ankle-brachial index indicates moderate right lower extremity arterial disease. The right toe-brachial index is abnormal. Left: Resting left ankle-brachial index is within normal range. The left toe-brachial index is normal. *See table(s) above for measurements and observations.  Electronically signed by [name] MD on 05/12/2022 at 1:04:36 PM.  Final

## 2022-07-20 IMAGING — US IR BIOPSY CORE SALIVARY GLAND
2 series · 15 of 23 positions shown · non-contrast
Comparison: none

CLINICAL DATA: Left parotid gland mass.

[Series 1: us biopsy · 14 of 21 slices shown (1 of 2)]
[im 1/21]
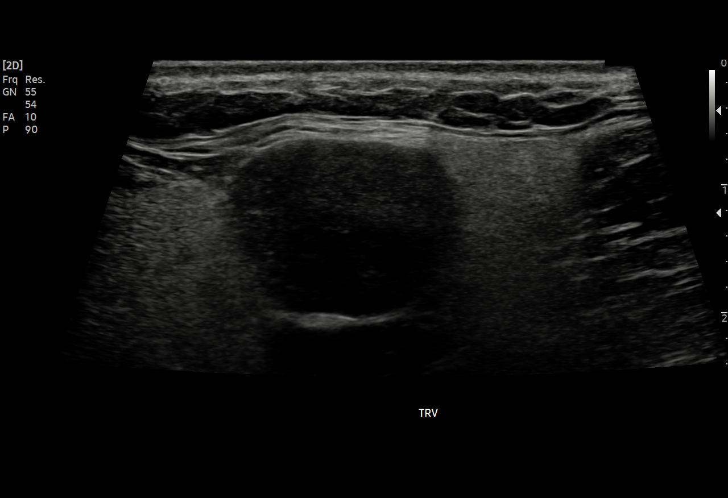
[im 3/21]
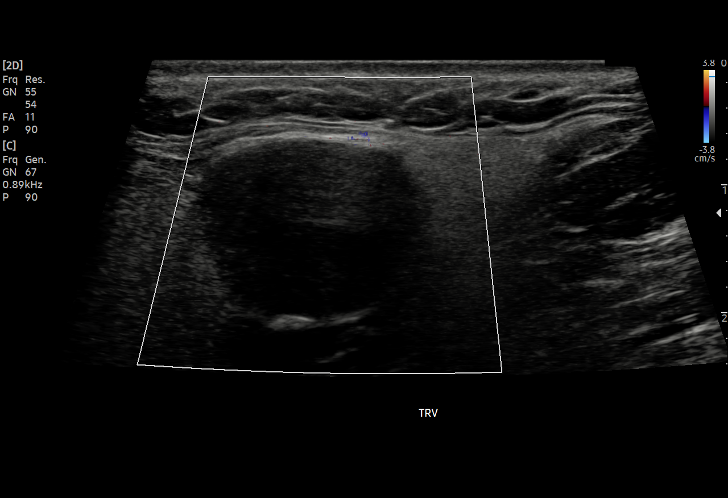
[im 4/21]
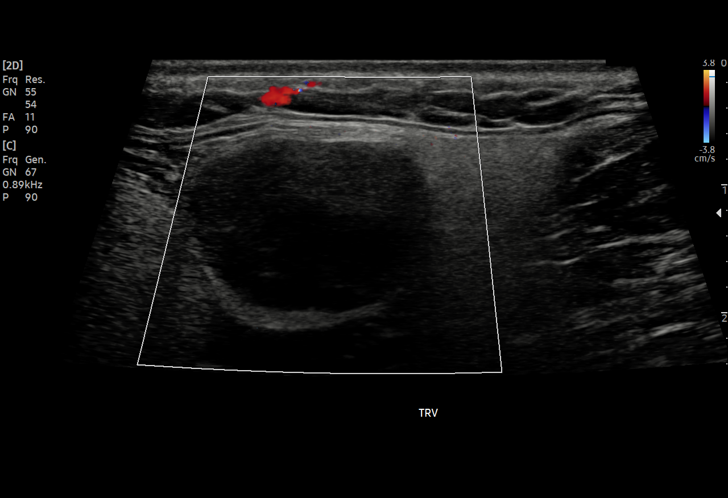
[im 6/21]
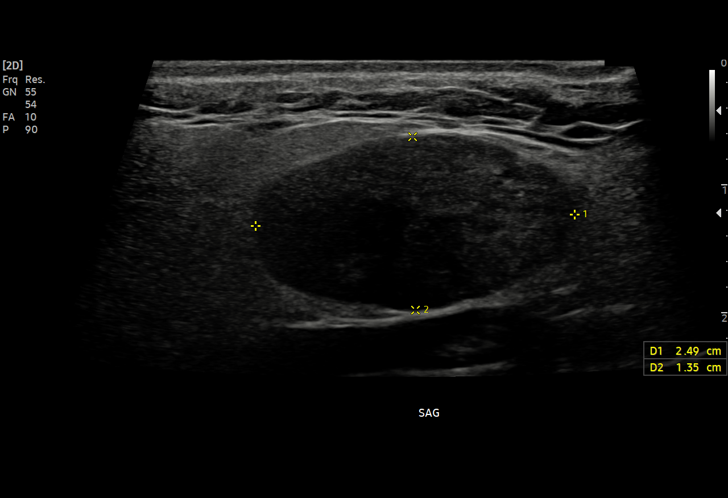
[im 7/21]
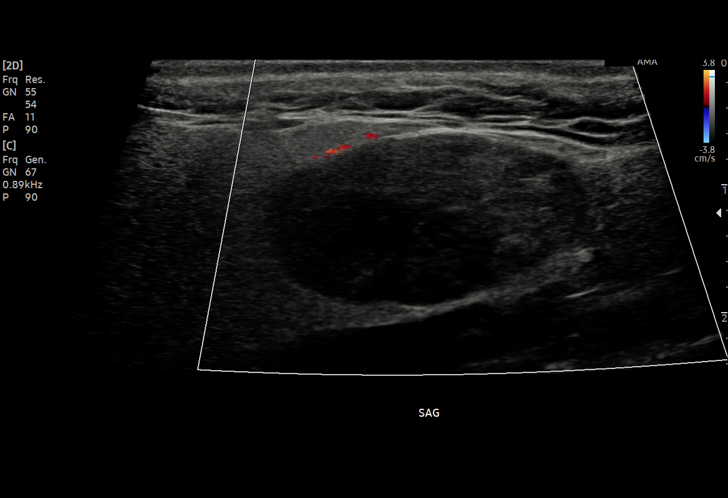
[im 9/21]
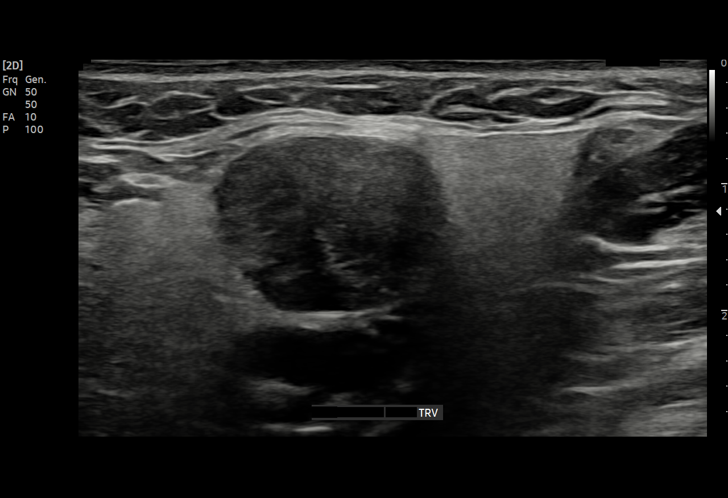
[im 10/21]
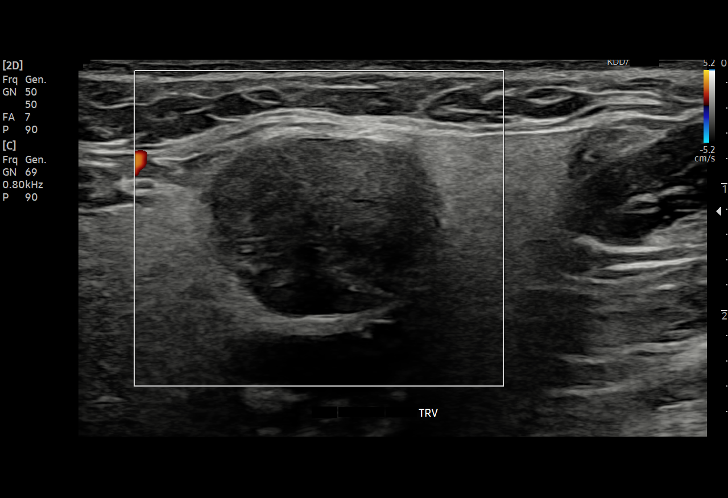
[im 12/21]
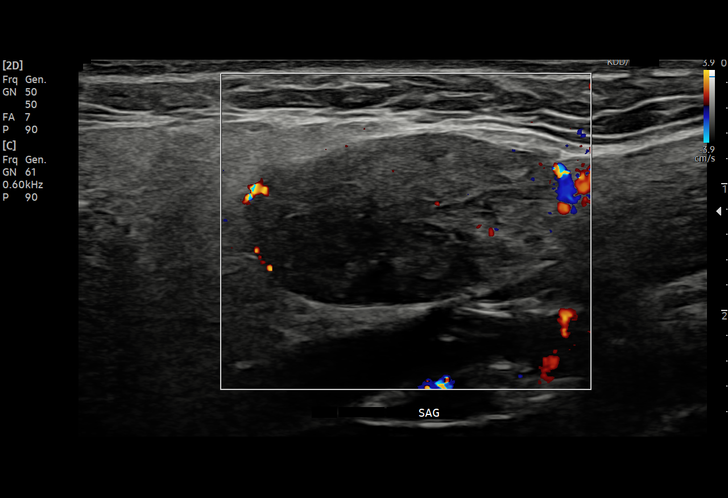
[im 14/21]
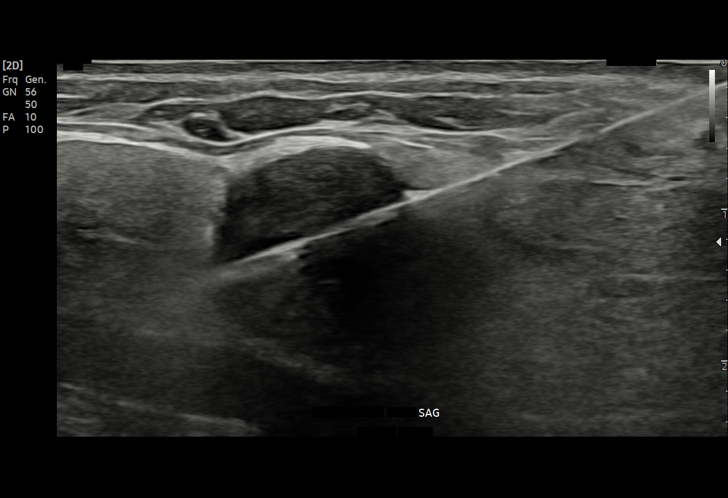
[im 15/21]
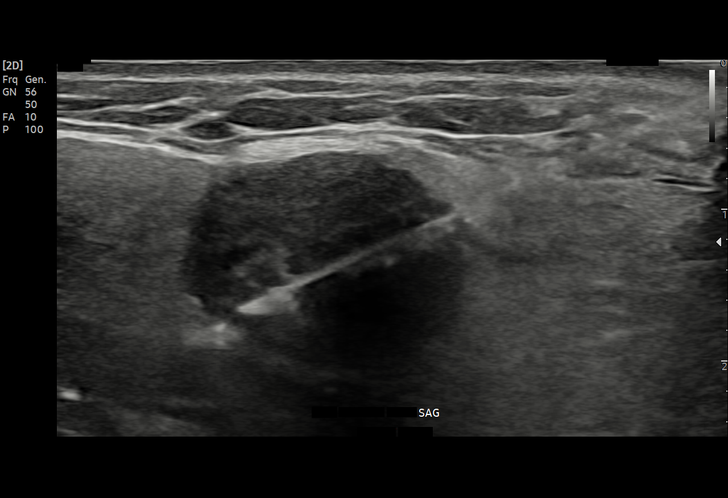
[im 17/21]
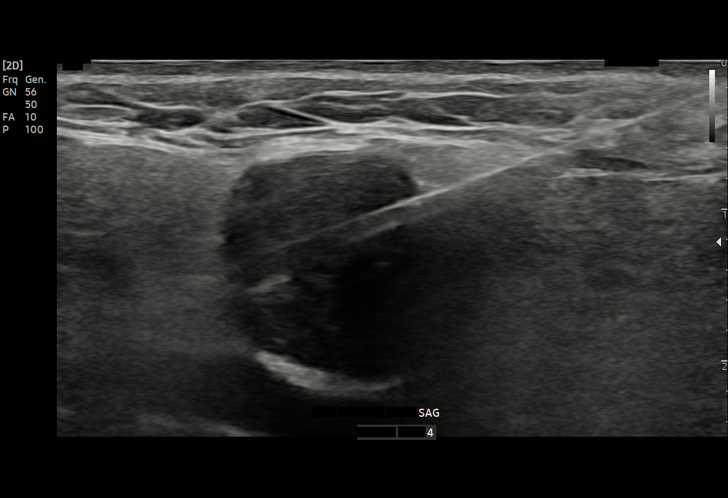
[im 18/21]
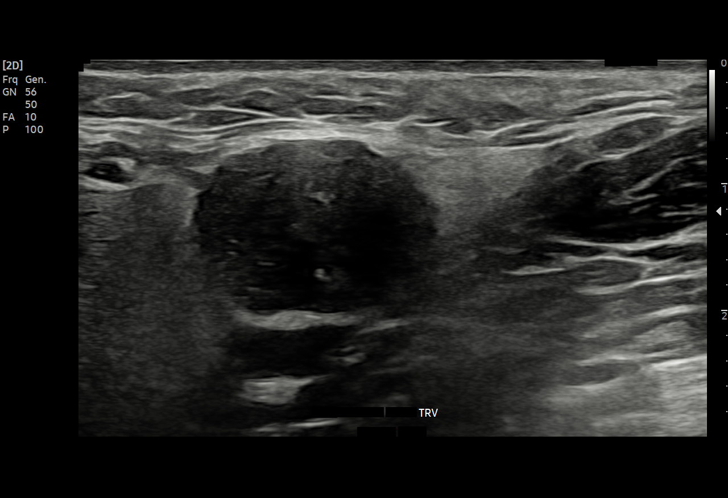
[im 20/21]
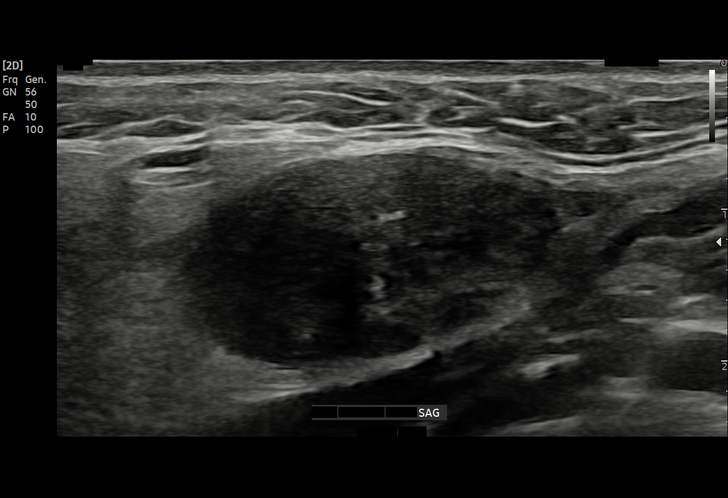
[im 21/21]
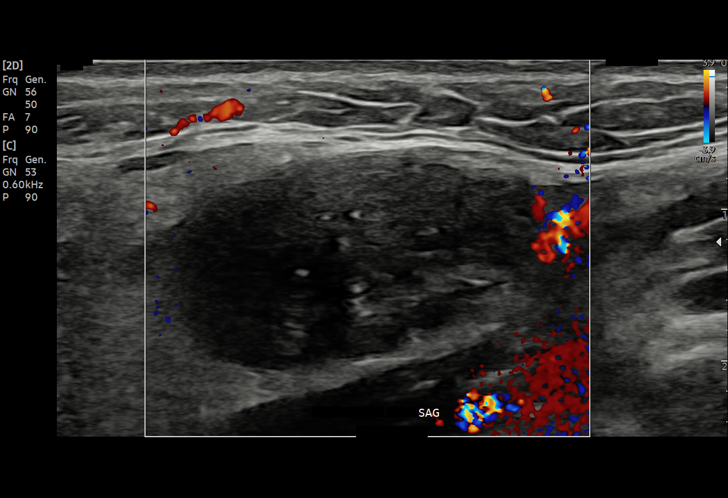

[Series 2: us biopsy · 1 of 2 slices shown (2 of 2)]
[im 2/2]
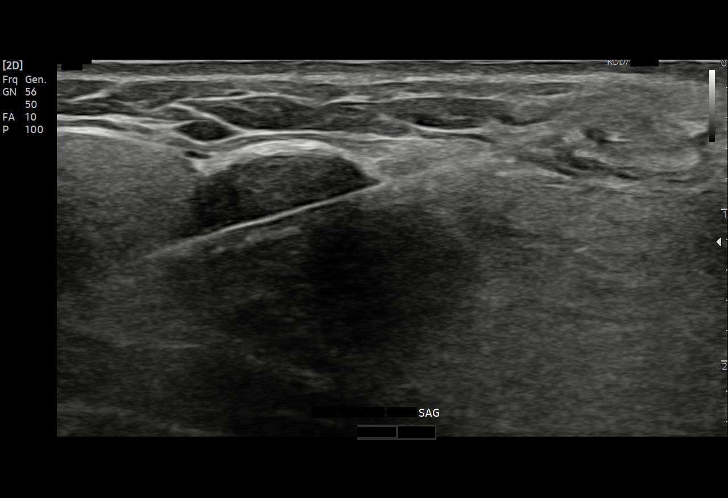

[15 of 23 positions shown; findings below may reference images not displayed]

EXAM:
ULTRASOUND GUIDED CORE BIOPSY OF LEFT PAROTID MASS

MEDICATIONS:
None

PROCEDURE:
The procedure, risks, benefits, and alternatives were explained to
the patient. Questions regarding the procedure were encouraged and
answered. The patient understands and consents to the procedure. A
time out was performed prior to initiating the procedure.

Ultrasound was performed to localize a left parotid mass. The left
face was prepped with chlorhexidine in a sterile fashion, and a
sterile drape was applied covering the operative field. A sterile
gown and sterile gloves were used for the procedure. Local
anesthesia was provided with 1% Lidocaine.

Under ultrasound guidance, an 18 gauge core biopsy device was
utilized in obtaining 4 separate core biopsy samples of a left
parotid mass. Core biopsy samples were submitted in formalin.

COMPLICATIONS:
None.
FINDINGS: Smoothly marginated, oval shaped lesion of the tail of the left
parotid gland measures approximately 2.5 x 1.4 x 1.7 cm by
ultrasound. Solid tissue was obtained with core biopsy.
IMPRESSION: Ultrasound-guided core biopsy performed of a left parotid gland mass
measuring up to 2.5 cm in maximum diameter by ultrasound.

## 2022-07-25 ENCOUNTER — Encounter: Payer: Self-pay | Admitting: Internal Medicine

## 2022-07-25 ENCOUNTER — Inpatient Hospital Stay: Payer: Medicare Other | Attending: Oncology | Admitting: Internal Medicine

## 2022-07-25 VITALS — BP 159/85 | HR 81 | Temp 97.4°F | Resp 20 | Ht 71.0 in | Wt 211.4 lb

## 2022-07-25 DIAGNOSIS — E032 Hypothyroidism due to medicaments and other exogenous substances: Secondary | ICD-10-CM | POA: Insufficient documentation

## 2022-07-25 DIAGNOSIS — Z5112 Encounter for antineoplastic immunotherapy: Secondary | ICD-10-CM | POA: Insufficient documentation

## 2022-07-25 DIAGNOSIS — C7931 Secondary malignant neoplasm of brain: Secondary | ICD-10-CM | POA: Diagnosis present

## 2022-07-25 DIAGNOSIS — I739 Peripheral vascular disease, unspecified: Secondary | ICD-10-CM | POA: Diagnosis not present

## 2022-07-25 DIAGNOSIS — C3411 Malignant neoplasm of upper lobe, right bronchus or lung: Secondary | ICD-10-CM | POA: Diagnosis present

## 2022-07-25 DIAGNOSIS — Z79899 Other long term (current) drug therapy: Secondary | ICD-10-CM | POA: Diagnosis not present

## 2022-07-25 MED ORDER — DEXAMETHASONE 1 MG PO TABS: ORAL_TABLET | ORAL | 0 refills | Status: AC

## 2022-07-25 NOTE — Progress Notes (Signed)
[place] [place] [address], [place] [address] [phone]   Interval Evaluation  Date of Service: 07/25/22 Patient Name: Joshua [name] Patient MRN: [id number] Patient DOB: [birth year] Provider: Ventura Sellers, MD  Identifying Statement:  Joshua [name] is a 67 y.o. male with Malignant neoplasm metastatic to brain [facility]) - Plan: MR BRAIN W WO CONTRAST    Primary Cancer:  Oncologic History: Oncology History  Squamous cell carcinoma of right lung ([place])  09/18/2020 Imaging   PET scan showed right upper lobe nodule maximal SUV 11.5.  Hypermetabolic cluster right paratracheal adenopathy with maximum SUV up to 12.  No findings of metastatic disease to the neck/abdomen/pelvis or skeleton.   09/20/2020 Initial Diagnosis   Squamous cell carcinoma of right lung (HCC) NGS showed PD-L1 TPS 95%, LRP1B S953fs, PIK3CA E545K, RB1 c1422-2A>T, RIT1 T83_ A84del, STK11 P251fs, TPS53 R248W, TMB 19.61mut/mb, MS stable.    09/20/2020 Cancer Staging   Staging form: Lung, AJCC 8th Edition - Clinical stage from 09/20/2020: Stage IV (cT1b, cN2, cM1) - Signed by [name], MD on 12/13/2021   10/03/2020 Imaging   MRI brain with and without contrast showed no evidence of intracranial metastatic disease. Well-defined round lesion within the left parotid tail measuring approximately 2.3 x 1.8 x 1.8 cm   10/09/2020 - 11/27/2020 Radiation Therapy   concurrent chemoradiation   10/10/2020 - 10/31/2020 Chemotherapy   10/10/2020 concurrent chemoradiation.  carboplatin AUC of 2 and Taxol 45 mg/m2 x 4.  Additional chemotherapy was held due to thrombocytopenia, neutropenia       12/31/2020 - 03/21/2022 Chemotherapy   Maintenance  Durvalumab q14d      12/31/2020 -  Chemotherapy   Patient is on Treatment Plan : LUNG Durvalumab (10) q14d     03/28/2021 Imaging   CT without contrast showed new linear right perihilar consolidation with associated traction bronchiectasis.   Likely postradiation changes.  Additional new peribronchial vascular/nodular groundglass opacities with areas of consolidation or seen primarily in the right upper and lower lobes, more distant from expected radiation fields. 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  Per my discussion with pulmonology Dr. 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     05/09/2021 Imaging   Repeat CT chest without contrast showed evolving radiation changes in the right perihilar region likely accounting for interval increase to thickening of the posterior wall of the right upper lobe bronchus.  The more peripheral right lung groundglass opacities seen on most recent study have generally improved with exception of one peripherally right upper lobe which appears new.  These are likely inflammatory.  No findings highly suspicious for local recurrence.   05/30/2021 - 01/10/2022 Chemotherapy   Durvalumab q14d     10/07/2021 Imaging   MRI brain with and without contrast showed 2 contrast-enhancing lesions, most consistent with metastatic disease.  Largest lesion is in the left frontal lobe and measures up to 1.3 cm with a large amount of surrounding edema and 9 mm rightward midline shift.  The other enhancing lesion was 4 mm in the right occipital lobe with a small amount of surrounding edema.  No acute or chronic hemorrhage.   10/07/2021 Progression   Headache after colonoscopy, progressive difficulty using her right arm.  Progressively worsening of speech. Stage IV lung cancer with brain metastasis.  started on Dexamethasone.  He was seen by  neurosurgery and radiation oncology.  Recommendation is to proceed with SRS.  Patient was discharged with dexamethasone   10/16/2021 Imaging   PET scan showed postradiation changes in the right supra hilar lung without evidence of local recurrence.  No  evidence of lung cancer recurrence or metastatic disease.  Single focus of intense metabolic activity associated with posterior left 11th rib likely secondary to recent fall/posttraumatic metabolic changes.  Frontal  uptake in the left frontal lobe corresponding to the known metastatic lesion in the brain   10/29/2021 -  Radiation Therapy   Brain Radiation [facility]   12/10/2021 Imaging   Brain MRI: Continued interval decrease in size of a left frontal lobe metastasis, now measuring 10 mm. Moderate surrounding edema has also decreased.  3-4 mm metastasis within the right occipital lobe, not significantly changed in size. There is now a small focus of central non enhancement within this lesion. Increased edema. 3 mm enhancing metastasis within the high left parietal lobe. In retrospect, this was likely present as a subtle punctate focus of enhancement on the prior brain MRI of 10/22/2021.   01/18/2022 Imaging   CT chest abdomen pelvis w contrast 1. Stable post treatment changes in the right upper lobe medially with dense radiation fibrosis but no findings to suggest local recurrent tumor. 2. Significant progression of ill-defined ground-glass opacity, interstitial thickening and airspace nodularity in the right upper lobe and upper aspect of the right lower lobe. Findings could reflect an inflammatory or atypical infectious process, drug induced pneumonitis or interstitial spread of tumor.3. No mediastinal or hilar mass or adenopathy. 4. No findings for abdominal/pelvic metastatic disease or osseous metastatic disease.   02/19/2022 Imaging   MRI brain w wo contrast showed Three metastatic deposits in the brain appear smaller. Decreased edema in the left parietal lobe. No new lesions    04/04/2022 Imaging   CT chest w contrast 1. Stable radiation changes in the right perihilar region without evidence of local recurrence. 2. Interval improvement in the patchy ground-glass opacities previously demonstrated in  both lungs, most consistent with a resolving infectious or inflammatory process (including immunotherapy-related pneumonitis). 3. No evidence of metastatic disease.   05/14/2022 Miscellaneous   He had a mechanical fall and fractured his left elbow. xrays showed a left elbow fracture  05/26/22 seen by orthopedic surgeon, cast applied.    05/27/2022 Imaging   MRI brain w wo contrast 1. All three known brain metastases have enlarged since August, with increasing regional edema. And there is a small new left anterior cerebellar 4-5 mm metastasis (was questionably punctate on the May MRI). This constellation could reflect a combination of True Progression (Left Cerebellar lesion) and pseudoprogression/radionecrosis (3 existing mets).    2. No significant intracranial mass effect. 3. Underlying moderate cerebral white matter signal changes.   05/30/2022 Imaging   CT chest abdomen pelvis w contrast  1. 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 There is a stable small right pleural effusion, but no definitive findings to suggest residual or recurrent disease on today's examination. 2. No signs of metastatic disease in the abdomen or pelvis.  3. Aortic atherosclerosis, in addition to two-vessel coronary artery disease. Please note that although the presence of coronary artery calcium documents the presence of coronary artery disease, the severity of this disease and any potential stenosis cannot be assessed on this non-gated CT examination. Assessment for potential risk factor modification, dietary therapy or pharmacologic  therapy may be warranted, if clinically indicated. 4. Hepatic steatosis. 5. Colonic diverticulosis without evidence of acute diverticulitis at this time. 6. Incompletely imaged but apparently occluded vascular graft in the superior aspect of the right anterior  thigh. Referral to vascular specialist for further clinical evaluation should be considered if clinically appropriate. 7. Additional incidental findings, as above.    CNS Oncologic History 10/29/21: SRS to 3 metastases ([place])  Interval History: Joshua [name] presents today for follow up after recent MRI brain.  No new or progressive changes, still describes some dragging of right leg, though this is partly from claudication pain.  No seizures, headaches.  Continues on ifimzi with Dr. Tasia Catchings.  H+P (06/20/22) Patient presents today for evaluation for neurologic complaints.  He describes gradual onset of impaired speech output and right arm greater than right leg weakness.  This led to a fall and an injury to his left arm.  He was started on decadron two weeks ago, this has led to considerable improvement in symptoms.  Today he feels at his prior baseline.  No issues tolerating the steroids.  He is due for durvalumab infusion today with Dr. Tasia Catchings.   Medications: Current Outpatient Medications on File Prior to Visit  Medication Sig Dispense Refill   aspirin EC 81 MG tablet Take 81 mg by mouth daily. Swallow whole.     Calcium Carbonate-Vitamin D (CALCIUM 600+D PO) Take 2 tablets by mouth daily.     clopidogrel (PLAVIX) 75 MG tablet TAKE 1 TABLET BY MOUTH  DAILY 100 tablet 2   Glycopyrrolate-Formoterol (BEVESPI AEROSPHERE) 9-4.8 MCG/ACT AERO Inhale 2 puffs into the lungs 2 (two) times daily. 32.1 g 1   levETIRAcetam (KEPPRA) 500 MG tablet Take 1 tablet (500 mg total) by mouth 2 (two) times daily. 180 tablet 3   levothyroxine (SYNTHROID) 100 MCG tablet TAKE 1 TABLET BY MOUTH ONCE DAILY BEFORE BREAKFAST 30 tablet 0   omeprazole (PRILOSEC) 40 MG capsule Take 40 mg by mouth daily.     simvastatin (ZOCOR) 40 MG tablet Take 40 mg by mouth at bedtime.     No current facility-administered medications on file prior to visit.    Allergies: No Known Allergies Past Medical History:  Past Medical  History:  Diagnosis Date   Allergy    Cancer ([place])    lung   COPD (chronic obstructive pulmonary disease) (HCC)    Dyspnea    former smoker. only doe   History of kidney stones    Hypertension    Peripheral vascular disease ([place])    Pre-diabetes    Sleep apnea    Past Surgical History:  Past Surgical History:  Procedure Laterality Date   ANTERIOR CERVICAL DECOMP/DISCECTOMY FUSION N/A 03/14/2020   Procedure: ANTERIOR CERVICAL DECOMPRESSION/DISCECTOMY FUSION 3 LEVELS C4-7;  Surgeon: Meade Maw, MD;  Location: [facility] ORS;  Service: Neurosurgery;  Laterality: N/A;   BACK SURGERY  2011   CENTRAL LINE INSERTION Right 09/10/2016   Procedure: CENTRAL LINE INSERTION;  Surgeon: Katha Cabal, MD;  Location: [facility] ORS;  Service: Vascular;  Laterality: Right;   CERVICAL FUSION     C4-c7 on Mar 14, 2020   COLONOSCOPY  2013 ?   COLONOSCOPY N/A 09/09/2021   Procedure: COLONOSCOPY;  Surgeon: [name], DO;  Location: [facility] ENDOSCOPY;  Service: Gastroenterology;  Laterality: N/A;   CORONARY ANGIOPLASTY     ESOPHAGOGASTRODUODENOSCOPY N/A 09/09/2021   Procedure: ESOPHAGOGASTRODUODENOSCOPY (EGD);  Surgeon: [name], DO;  Location: [facility] ENDOSCOPY;  Service: Gastroenterology;  Laterality: N/A;   FEMORAL-POPLITEAL BYPASS GRAFT Right 09/10/2016   Procedure: BYPASS GRAFT FEMORAL-POPLITEAL ARTERY ( BELOW KNEE );  Surgeon: Katha Cabal, MD;  Location: [facility] ORS;  Service: Vascular;  Laterality: Right;   FEMORAL-TIBIAL BYPASS GRAFT Right 03/23/2020   Procedure: BYPASS GRAFT RIGHT FEMORAL- DISTAL TIBIAL ARTERY WITH DISTAL FLOW GRAFT;  Surgeon: Katha Cabal, MD;  Location: [facility] ORS;  Service: Vascular;  Laterality: Right;   LEFT HEART CATH AND CORONARY ANGIOGRAPHY Left 05/16/2021   Procedure: LEFT HEART CATH AND CORONARY ANGIOGRAPHY;  Surgeon: Andrez Grime, MD;  Location: [place] CV LAB;  Service: Cardiovascular;  Laterality: Left;   LOWER EXTREMITY  ANGIOGRAPHY Right 11/18/2016   Procedure: Lower Extremity Angiography;  Surgeon: Katha Cabal, MD;  Location: [place] CV LAB;  Service: Cardiovascular;  Laterality: Right;   LOWER EXTREMITY ANGIOGRAPHY Right 12/09/2016   Procedure: Lower Extremity Angiography;  Surgeon: Katha Cabal, MD;  Location: [place] [facility] LAB;  Service: Cardiovascular;  Laterality: Right;   LOWER EXTREMITY ANGIOGRAPHY Right 11/15/2019   Procedure: LOWER EXTREMITY ANGIOGRAPHY;  Surgeon: Katha Cabal, MD;  Location: [place] [facility] LAB;  Service: Cardiovascular;  Laterality: Right;   LOWER EXTREMITY ANGIOGRAPHY Right 12/01/2019   Procedure: Lower Extremity Angiography;  Surgeon: Algernon Huxley, MD;  Location: [place] CV LAB;  Service: Cardiovascular;  Laterality: Right;   LOWER EXTREMITY ANGIOGRAPHY Right 12/02/2019   Procedure: LOWER EXTREMITY ANGIOGRAPHY;  Surgeon: Katha Cabal, MD;  Location: [place] [facility] LAB;  Service: Cardiovascular;  Laterality: Right;   LOWER EXTREMITY ANGIOGRAPHY Right 03/23/2020   Procedure: Lower Extremity Angiography;  Surgeon: Katha Cabal, MD;  Location: [place] [facility] LAB;  Service: Cardiovascular;  Laterality: Right;   LOWER EXTREMITY ANGIOGRAPHY Right 09/25/2021   Procedure: Lower Extremity Angiography;  Surgeon: Katha Cabal, MD;  Location: [place] [facility];  Service: Cardiovascular;  Laterality: Right;   LOWER EXTREMITY ANGIOGRAPHY Right 09/26/2021   Procedure: Lower Extremity Angiography;  Surgeon: Algernon Huxley, MD;  Location: [place] [facility] LAB;  Service: Cardiovascular;  Laterality: Right;   LOWER EXTREMITY ANGIOGRAPHY Right 04/08/2022   Procedure: Lower Extremity Angiography;  Surgeon: Katha Cabal, MD;  Location: [place] [facility];  Service: Cardiovascular;  Laterality: Right;   LOWER EXTREMITY INTERVENTION  11/18/2016   Procedure: Lower Extremity Intervention;  Surgeon: Katha Cabal, MD;  Location: [place] [facility] LAB;   Service: Cardiovascular;;   PERIPHERAL VASCULAR CATHETERIZATION Right 01/02/2015   Procedure: Lower Extremity Angiography;  Surgeon: Katha Cabal, MD;  Location: [place] [facility] LAB;  Service: Cardiovascular;  Laterality: Right;   PERIPHERAL VASCULAR CATHETERIZATION Right 06/18/2015   Procedure: Lower Extremity Angiography;  Surgeon: Algernon Huxley, MD;  Location: [name] [facility];  Service: Cardiovascular;  Laterality: Right;   PERIPHERAL VASCULAR CATHETERIZATION  06/18/2015   Procedure: Lower Extremity Intervention;  Surgeon: Algernon Huxley, MD;  Location: [place] [facility] LAB;  Service: Cardiovascular;;   PERIPHERAL VASCULAR CATHETERIZATION N/A 10/09/2015   Procedure: Abdominal Aortogram w/Lower Extremity;  Surgeon: Katha Cabal, MD;  Location: [place] [facility] LAB;  Service: Cardiovascular;  Laterality: N/A;   PERIPHERAL VASCULAR CATHETERIZATION  10/09/2015   Procedure: Lower Extremity Intervention;  Surgeon: Katha Cabal, MD;  Location: [place] [facility] LAB;  Service: Cardiovascular;;   PERIPHERAL VASCULAR CATHETERIZATION Right 10/10/2015   Procedure: Lower Extremity Angiography;  Surgeon: Algernon Huxley, MD;  Location: [place] [facility];  Service: Cardiovascular;  Laterality: Right;  PERIPHERAL VASCULAR CATHETERIZATION Right 10/10/2015   Procedure: Lower Extremity Intervention;  Surgeon: Algernon Huxley, MD;  Location: [place] [facility] LAB;  Service: Cardiovascular;  Laterality: Right;   PERIPHERAL VASCULAR CATHETERIZATION Right 12/03/2015   Procedure: Lower Extremity Angiography;  Surgeon: Algernon Huxley, MD;  Location: [place] [facility];  Service: Cardiovascular;  Laterality: Right;   PERIPHERAL VASCULAR CATHETERIZATION Right 12/04/2015   Procedure: Lower Extremity Angiography;  Surgeon: Algernon Huxley, MD;  Location: [place] [facility] LAB;  Service: Cardiovascular;  Laterality: Right;   PERIPHERAL VASCULAR CATHETERIZATION  12/04/2015   Procedure: Lower Extremity Intervention;  Surgeon:  Algernon Huxley, MD;  Location: [place] [facility];  Service: Cardiovascular;;   PERIPHERAL VASCULAR CATHETERIZATION Right 06/30/2016   Procedure: Lower Extremity Angiography;  Surgeon: Algernon Huxley, MD;  Location: [place] [facility] LAB;  Service: Cardiovascular;  Laterality: Right;   PERIPHERAL VASCULAR CATHETERIZATION Right 06/25/2016   Procedure: Lower Extremity Angiography;  Surgeon: Katha Cabal, MD;  Location: [place] [facility] LAB;  Service: Cardiovascular;  Laterality: Right;   PERIPHERAL VASCULAR CATHETERIZATION Right 07/01/2016   Procedure: Lower Extremity Angiography;  Surgeon: Katha Cabal, MD;  Location: [place] [facility] LAB;  Service: Cardiovascular;  Laterality: Right;   PERIPHERAL VASCULAR CATHETERIZATION  07/01/2016   Procedure: Lower Extremity Intervention;  Surgeon: Katha Cabal, MD;  Location: [place] [facility] LAB;  Service: Cardiovascular;;   PERIPHERAL VASCULAR CATHETERIZATION Right 08/01/2016   Procedure: Lower Extremity Angiography;  Surgeon: Katha Cabal, MD;  Location: [place] [facility];  Service: Cardiovascular;  Laterality: Right;   PORTA CATH INSERTION N/A 10/05/2020   Procedure: PORTA CATH INSERTION;  Surgeon: Katha Cabal, MD;  Location: [place] [facility];  Service: Cardiovascular;  Laterality: N/A;   stent placement in right leg Right    VIDEO BRONCHOSCOPY N/A 09/14/2020   Procedure: VIDEO BRONCHOSCOPY WITH FLUORO;  Surgeon: Tyler Pita, MD;  Location: [facility] ORS;  Service: Cardiopulmonary;  Laterality: N/A;   VIDEO BRONCHOSCOPY WITH ENDOBRONCHIAL ULTRASOUND N/A 09/14/2020   Procedure: VIDEO BRONCHOSCOPY WITH ENDOBRONCHIAL ULTRASOUND;  Surgeon: Tyler Pita, MD;  Location: [facility] ORS;  Service: Cardiopulmonary;  Laterality: N/A;   Social History:  Social History   Socioeconomic History   Marital status: Single    Spouse name: Not on file   Number of children: Not on file   Years of education: Not on file   Highest education  level: Not on file  Occupational History   Occupation: unemployed  Tobacco Use   Smoking status: Former    Packs/day: 2.00    Years: 38.00    Total pack years: 76.00    Types: Cigarettes    Quit date: 12/20/2014    Years since quitting: 7.6   Smokeless tobacco: Never   Tobacco comments:    quit smoking in 2017 most smoked 2   Vaping Use   Vaping Use: Former  Substance and Sexual Activity   Alcohol use: Not Currently   Drug use: No   Sexual activity: Yes  Other Topics Concern   Not on file  Social History Narrative   Not on file   Social Determinants of Health   Financial Resource Strain: Not on file  Food Insecurity: Not on file  Transportation Needs: Not on file  Physical Activity: Not on file  Stress: Not on file  Social Connections: Not on file  Intimate Partner Violence: Not on file   Family History:  Family History  Problem Relation  Age of Onset   Diabetes Mother    Hypertension Mother    Heart murmur Mother    Leukemia Mother    Throat cancer Maternal Grandmother    Colon cancer Neg Hx    Breast cancer Neg Hx     Review of Systems: Constitutional: Doesn't report fevers, chills or abnormal weight loss Eyes: Doesn't report blurriness of vision Ears, nose, mouth, throat, and face: Doesn't report sore throat Respiratory: Doesn't report cough, dyspnea or wheezes Cardiovascular: Doesn't report palpitation, chest discomfort  Gastrointestinal:  Doesn't report nausea, constipation, diarrhea GU: Doesn't report incontinence Skin: Doesn't report skin rashes Neurological: Per HPI Musculoskeletal: Doesn't report joint pain Behavioral/Psych: Doesn't report anxiety  Physical Exam: Vitals:   07/25/22 1035  BP: (!) 159/85  Pulse: 81  Resp: 20  Temp: (!) 97.4 F (36.3 C)  SpO2: 100%   KPS: 80. General: Alert, cooperative, pleasant, in no acute distress Head: Normal EENT: No conjunctival injection or scleral icterus.  Lungs: Resp effort normal Cardiac:  Regular rate Abdomen: Non-distended abdomen Skin: No rashes cyanosis or petechiae. Extremities: Left arm in sling  Neurologic Exam: Mental Status: Awake, alert, attentive to examiner. Oriented to self and environment. Language is fluent with intact comprehension.  Cranial Nerves: Visual acuity is grossly normal. Visual fields are full. Extra-ocular movements intact. No ptosis. Face is symmetric Motor: Tone and bulk are normal. Power is full in both arms and legs. Reflexes are symmetric, no pathologic reflexes present.  Sensory: Intact to light touch Gait: Normal.   Labs: I have reviewed the data as listed    Component Value Date/Time   NA 138 07/18/2022 1000   NA 141 10/14/2019 1101   K 3.9 07/18/2022 1000   CL 102 07/18/2022 1000   CO2 28 07/18/2022 1000   GLUCOSE 100 (H) 07/18/2022 1000   BUN 27 (H) 07/18/2022 1000   BUN 15 10/14/2019 1101   BUN 16 07/03/2014 1205   CREATININE 1.08 07/18/2022 1000   CREATININE 1.34 (H) 07/03/2014 1205   CALCIUM 9.0 07/18/2022 1000   PROT 6.7 07/18/2022 1000   PROT 7.0 04/16/2020 1525   ALBUMIN 3.8 07/18/2022 1000   ALBUMIN 4.3 04/16/2020 1525   AST 24 07/18/2022 1000   ALT 49 (H) 07/18/2022 1000   ALKPHOS 50 07/18/2022 1000   BILITOT 0.5 07/18/2022 1000   BILITOT 0.4 04/16/2020 1525   GFRNONAA >60 07/18/2022 1000   GFRNONAA 59 (L) 07/03/2014 1205   GFRAA >60 03/30/2020 0536   GFRAA >60 07/03/2014 1205   Lab Results  Component Value Date   WBC 8.0 07/18/2022   NEUTROABS 6.6 07/18/2022   HGB 13.2 07/18/2022   HCT 40.6 07/18/2022   MCV 83.9 07/18/2022   PLT 166 07/18/2022    Imaging:  MR BRAIN W WO CONTRAST  Result Date: 07/22/2022 CLINICAL DATA:  Lung carcinoma, treated metastatic disease. Stereotactic radio surgery. EXAM: MRI HEAD WITHOUT AND WITH CONTRAST TECHNIQUE: Multiplanar, multiecho pulse sequences of the brain and surrounding structures were obtained without and with intravenous contrast. CONTRAST:  68mL GADAVIST  GADOBUTROL 1 MMOL/ML IV SOLN COMPARISON:  05/23/2022 brain MRI FINDINGS: Brain: No acute infarct or acute hemorrhage. No hydrocephalus or midline shift. There is multifocal hyperintense T2-weighted signal within the periventricular and deep white matter. There are no new or larger lesions. There are 4 lesions that are unchanged or smaller: 1. Left cerebellum, 2 mm, unchanged.  Series 1,023 image 162 2. Right occipital lobe, 8 mm, unchanged, image 137 3. Left frontoparietal junction,  20 mm pelvis slightly smaller. Markedly decreased contrast enhancement and decreased surrounding hyperintense T2-weighted signal. Image 93 4. Left postcentral gyrus, 7 mm, smaller, image 52 Vascular: Normal flow voids. Skull and upper cervical spine: Normal bone marrow signal. Sinuses/Orbits: Normal orbits. Paranasal sinuses and mastoids are normal. Other: None IMPRESSION: 1. No new or enlarging lesions. No intracranial disease progression. 2. Decreased size of treated lesions of the left frontoparietal junction and left postcentral gyrus. 3. Unchanged lesions in the left cerebellum and right occipital lobe. Electronically Signed   By: Ulyses Jarred M.D.   On: 07/22/2022 23:04    [place] Clinician Interpretation: I have personally reviewed the radiological images as listed.  My interpretation, in the context of the patient's clinical presentation, is stable disease   Assessment/Plan Malignant neoplasm metastatic to brain [facility]) - Plan: MR BRAIN W WO CONTRAST  Nobel A Rudd is clinically and radiographically stable today.  Novel untreated focus of enhancement in posterior fossa, seen on prior study, is stable or contracted.  Recommended decreasing decadron by 1mg  each week, starting with 3mg , until discontinued.  He may discontinue keppra given lack of seizure history, resolved CNS inflammation.  We appreciate the opportunity to participate in the care of Joshua [name].   We ask that Joshua [name] return to  clinic in 3 months following next brain MRI, or sooner as needed.  All questions were answered. The patient knows to call the clinic with any problems, questions or concerns. No barriers to learning were detected.  The total time spent in the encounter was 30 minutes and more than 50% was on counseling and review of test results   Ventura Sellers, MD Medical Director of Neuro-Oncology [facility] [place] 07/25/22 11:28 AM

## 2022-07-25 NOTE — Progress Notes (Signed)
Patient here for follow up he reports that he has had one headache, loses his train of thought when talking, some dizziness.

## 2022-07-30 ENCOUNTER — Other Ambulatory Visit: Payer: Self-pay

## 2022-07-30 ENCOUNTER — Other Ambulatory Visit: Payer: Self-pay | Admitting: Oncology

## 2022-07-30 MED ORDER — LEVOTHYROXINE SODIUM 100 MCG PO TABS: 100.0000 ug | ORAL_TABLET | Freq: Every day | ORAL | 0 refills | Status: DC

## 2022-08-01 ENCOUNTER — Other Ambulatory Visit: Payer: Self-pay | Admitting: Oncology

## 2022-08-01 ENCOUNTER — Inpatient Hospital Stay: Payer: Medicare Other

## 2022-08-01 ENCOUNTER — Inpatient Hospital Stay: Payer: Medicare Other | Admitting: Oncology

## 2022-08-01 ENCOUNTER — Encounter: Payer: Self-pay | Admitting: Oncology

## 2022-08-01 VITALS — BP 162/84 | HR 81 | Temp 97.8°F | Wt 216.7 lb

## 2022-08-01 DIAGNOSIS — Z5112 Encounter for antineoplastic immunotherapy: Secondary | ICD-10-CM | POA: Diagnosis not present

## 2022-08-01 DIAGNOSIS — E032 Hypothyroidism due to medicaments and other exogenous substances: Secondary | ICD-10-CM | POA: Diagnosis not present

## 2022-08-01 DIAGNOSIS — C7931 Secondary malignant neoplasm of brain: Secondary | ICD-10-CM | POA: Diagnosis not present

## 2022-08-01 DIAGNOSIS — C3491 Malignant neoplasm of unspecified part of right bronchus or lung: Secondary | ICD-10-CM | POA: Diagnosis not present

## 2022-08-01 LAB — CBC WITH DIFFERENTIAL/PLATELET
Abs Immature Granulocytes: 0.04 10*3/uL (ref 0.00–0.07)
Basophils Absolute: 0 10*3/uL (ref 0.0–0.1)
Basophils Relative: 0 %
Eosinophils Absolute: 0 10*3/uL (ref 0.0–0.5)
Eosinophils Relative: 0 %
HCT: 40.1 % (ref 39.0–52.0)
Hemoglobin: 13.2 g/dL (ref 13.0–17.0)
Immature Granulocytes: 1 %
Lymphocytes Relative: 15 %
Lymphs Abs: 1 10*3/uL (ref 0.7–4.0)
MCH: 27.4 pg (ref 26.0–34.0)
MCHC: 32.9 g/dL (ref 30.0–36.0)
MCV: 83.4 fL (ref 80.0–100.0)
Monocytes Absolute: 0.6 10*3/uL (ref 0.1–1.0)
Monocytes Relative: 9 %
Neutro Abs: 5.1 10*3/uL (ref 1.7–7.7)
Neutrophils Relative %: 75 %
Platelets: 182 10*3/uL (ref 150–400)
RBC: 4.81 MIL/uL (ref 4.22–5.81)
RDW: 15.3 % (ref 11.5–15.5)
WBC: 6.7 10*3/uL (ref 4.0–10.5)
nRBC: 0 % (ref 0.0–0.2)

## 2022-08-01 LAB — COMPREHENSIVE METABOLIC PANEL
ALT: 50 U/L — ABNORMAL HIGH (ref 0–44)
AST: 29 U/L (ref 15–41)
Albumin: 3.6 g/dL (ref 3.5–5.0)
Alkaline Phosphatase: 52 U/L (ref 38–126)
Anion gap: 9 (ref 5–15)
BUN: 26 mg/dL — ABNORMAL HIGH (ref 8–23)
CO2: 25 mmol/L (ref 22–32)
Calcium: 8.6 mg/dL — ABNORMAL LOW (ref 8.9–10.3)
Chloride: 105 mmol/L (ref 98–111)
Creatinine, Ser: 1.15 mg/dL (ref 0.61–1.24)
GFR, Estimated: >60 mL/min (ref >60)
Glucose, Bld: 111 mg/dL — ABNORMAL HIGH (ref 70–99)
Potassium: 3.6 mmol/L (ref 3.5–5.1)
Sodium: 139 mmol/L (ref 135–145)
Total Bilirubin: 0.2 mg/dL — ABNORMAL LOW (ref 0.3–1.2)
Total Protein: 6.4 g/dL — ABNORMAL LOW (ref 6.5–8.1)

## 2022-08-01 MED ORDER — HEPARIN SOD (PORK) LOCK FLUSH 100 UNIT/ML IV SOLN
500.0000 [IU] | Freq: Once | INTRAVENOUS | Status: AC | PRN
  Administered 2022-08-01: 500 [IU]
  Filled 2022-08-01: qty 5

## 2022-08-01 MED ORDER — SODIUM CHLORIDE 0.9 % IV SOLN
Freq: Once | INTRAVENOUS | Status: AC
  Filled 2022-08-01: qty 250

## 2022-08-01 MED ORDER — SODIUM CHLORIDE 0.9% FLUSH
10.0000 mL | INTRAVENOUS | Status: DC | PRN
  Administered 2022-08-01: 10 mL via INTRAVENOUS
  Filled 2022-08-01: qty 10

## 2022-08-01 MED ORDER — SODIUM CHLORIDE 0.9 % IV SOLN
10.0000 mg/kg | Freq: Once | INTRAVENOUS | Status: AC
  Administered 2022-08-01: 1000 mg via INTRAVENOUS
  Filled 2022-08-01: qty 20

## 2022-08-01 NOTE — Assessment & Plan Note (Signed)
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  Repeat CT scan showed improvement of patchy ground-glass opacities.  Reviewed and discussed with patient CT chest abdomen pelvis showed no progression. Labs reviewed and discussed with patient. Proceed with Durvalumab

## 2022-08-02 ENCOUNTER — Encounter: Payer: Self-pay | Admitting: Oncology

## 2022-08-02 MED ORDER — LEVOTHYROXINE SODIUM 100 MCG PO TABS: 100.0000 ug | ORAL_TABLET | Freq: Every day | ORAL | 0 refills | Status: DC

## 2022-08-02 NOTE — Assessment & Plan Note (Signed)
Synthroid 100 mcg daily

## 2022-08-02 NOTE — Assessment & Plan Note (Addendum)
Status post radiation,  02/19/22 MRI showed decreased brain lesion size and edema.  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   The small left anterior cerebellar lesion is likely a new metastatic lesion, Dr.Vasclow recommend radiation. I have communicated with Dr.Chrystal, he recommends repeat MRI brain and decide.  Cc Dr. Eliseo Squires MRI findings.

## 2022-08-02 NOTE — Progress Notes (Signed)
Hematology/Oncology Progress note [phone] [phone]) [phone]    ASSESSMENT & PLAN:   Cancer Staging  Squamous cell carcinoma of right lung [facility]) Staging form: Lung, AJCC 8th Edition - Clinical stage from 09/20/2020: Stage IV (cT1b, cN2, cM1) - Signed by [name], MD on 12/13/2021   Squamous cell carcinoma of right lung ([place]) Durvalumab was previously held due to pneumonitis.  Repeat CT scan showed improvement of patchy ground-glass opacities.  Reviewed and discussed with patient CT chest abdomen pelvis showed no progression. Labs reviewed and discussed with patient. Proceed with Durvalumab  Hypocalcemia Recommend patient to take Calcium 1200mg  daily.   Hypothyroidism due to medication Synthroid 100 mcg daily   Malignant neoplasm metastatic to brain [facility]) Status post radiation,  02/19/22 MRI showed decreased brain lesion size and edema.  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   The small left anterior cerebellar lesion is likely a new metastatic lesion, Dr.Vasclow recommend radiation. I have communicated with Dr.Chrystal, he recommends repeat MRI brain and decide.  Cc Dr. Oren Bracket MRI findings.   Orders Placed This Encounter  Procedures   T4, free    Standing Status:   Future    Standing Expiration Date:   08/16/2023   T4, free    Standing Status:   Future    Standing Expiration Date:   09/13/2023   CBC with Differential    Standing Status:   Future    Standing Expiration Date:   09/13/2023   Comprehensive metabolic panel    Standing Status:   Future    Standing Expiration Date:   09/13/2023   TSH    Standing Status:   Future    Standing Expiration Date:   09/13/2023   CBC with Differential    Standing Status:   Future     Standing Expiration Date:   09/27/2023   Comprehensive metabolic panel    Standing Status:   Future    Standing Expiration Date:   09/27/2023   TSH    Standing Status:   Future    Standing Expiration Date:   09/27/2023   T4, free    Standing Status:   Future    Standing Expiration Date:   10/11/2023   CBC with Differential    Standing Status:   Future    Standing Expiration Date:   10/11/2023   Comprehensive metabolic panel    Standing Status:   Future    Standing Expiration Date:   10/11/2023   Follow up lab MD 2 weeks Durvalumab All questions were answered. The patient knows to call the clinic with any problems, questions or concerns.  [name], MD, PhD [facility] Hematology Oncology 08/01/2022      CHIEF COMPLAINTS/REASON FOR VISIT:  Follow up for lung cancer  HISTORY OF PRESENTING ILLNESS:  Joshua [name] is a 67 y.o. male who has above history reviewed by me today presents for follow up visit for Stage IV lung squamous cell carcinoma. Oncology History  Squamous cell carcinoma of right lung ([place])  09/18/2020 Imaging   PET scan showed right upper lobe nodule maximal SUV 11.5.  Hypermetabolic cluster right paratracheal adenopathy with maximum SUV up to 12.  No findings of metastatic disease to the neck/abdomen/pelvis or skeleton.   09/20/2020 Initial Diagnosis   Squamous  cell carcinoma of right lung (HCC) NGS showed PD-L1 TPS 95%, LRP1B S937fs, PIK3CA E545K, RB1 c1422-2A>T, RIT1 T83_ A84del, STK11 P243fs, TPS53 R248W, TMB 19.26mut/mb, MS stable.    09/20/2020 Cancer Staging   Staging form: Lung, AJCC 8th Edition - Clinical stage from 09/20/2020: Stage IV (cT1b, cN2, cM1) - Signed by [name], MD on 12/13/2021   10/03/2020 Imaging   MRI brain with and without contrast showed no evidence of intracranial metastatic disease. Well-defined round lesion within the left parotid tail measuring approximately 2.3 x 1.8 x 1.8 cm   10/09/2020 - 11/27/2020 Radiation Therapy   concurrent  chemoradiation   10/10/2020 - 10/31/2020 Chemotherapy   10/10/2020 concurrent chemoradiation.  carboplatin AUC of 2 and Taxol 45 mg/m2 x 4.  Additional chemotherapy was held due to thrombocytopenia, neutropenia       12/31/2020 - 03/21/2022 Chemotherapy   Maintenance  Durvalumab q14d      12/31/2020 -  Chemotherapy   Patient is on Treatment Plan : LUNG Durvalumab (10) q14d     03/28/2021 Imaging   CT without contrast showed new linear right perihilar consolidation with associated traction bronchiectasis.  Likely postradiation changes.  Additional new peribronchial vascular/nodular groundglass opacities with areas of consolidation or seen primarily in the right upper and lower lobes, more distant from expected radiation fields. 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  Per my discussion with pulmonology Dr. 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     05/09/2021 Imaging   Repeat CT chest without contrast showed evolving radiation changes in the right perihilar region likely accounting for interval increase to thickening of the posterior wall of the right upper lobe bronchus.  The more peripheral right lung groundglass opacities seen on most recent study have generally improved with exception of one peripherally right upper lobe which appears new.  These are likely inflammatory.  No findings highly suspicious for local recurrence.   05/30/2021 - 01/10/2022 Chemotherapy   Durvalumab q14d     10/07/2021 Imaging   MRI brain with and without contrast showed 2 contrast-enhancing lesions, most consistent with metastatic disease.  Largest lesion is in the left frontal lobe and measures up to 1.3 cm with a large amount of surrounding edema and 9 mm rightward midline shift.  The other enhancing lesion was 4 mm in the right occipital lobe with a small amount of surrounding edema.   No acute or chronic hemorrhage.   10/07/2021 Progression   Headache after colonoscopy, progressive difficulty using her right arm.  Progressively worsening of speech. Stage IV lung cancer with brain metastasis.  started on Dexamethasone.  He was seen by neurosurgery and radiation oncology.  Recommendation is to proceed with SRS.  Patient was discharged with dexamethasone   10/16/2021 Imaging   PET scan showed postradiation changes in the right supra hilar lung without evidence of local recurrence.  No evidence of lung cancer recurrence or metastatic disease.  Single focus of intense metabolic activity associated with posterior left 11th rib likely secondary to recent fall/posttraumatic metabolic changes.  Frontal  uptake in the left frontal lobe corresponding to the known metastatic lesion in the brain   10/29/2021 -  Radiation Therapy   Brain Radiation [facility]   12/10/2021 Imaging   Brain MRI: Continued interval decrease in size of a left frontal lobe metastasis, now measuring 10 mm. Moderate surrounding edema has also decreased.  3-4 mm metastasis  within the right occipital lobe, not significantly changed in size. There is now a small focus of central non enhancement within this lesion. Increased edema. 3 mm enhancing metastasis within the high left parietal lobe. In retrospect, this was likely present as a subtle punctate focus of enhancement on the prior brain MRI of 10/22/2021.   01/18/2022 Imaging   CT chest abdomen pelvis w contrast 1. Stable post treatment changes in the right upper lobe medially with dense radiation fibrosis but no findings to suggest local recurrent tumor. 2. Significant progression of ill-defined ground-glass opacity, interstitial thickening and airspace nodularity in the right upper lobe and upper aspect of the right lower lobe. Findings could reflect an inflammatory or atypical infectious process, drug induced pneumonitis or interstitial spread of tumor.3. No mediastinal or  hilar mass or adenopathy. 4. No findings for abdominal/pelvic metastatic disease or osseous metastatic disease.   02/19/2022 Imaging   MRI brain w wo contrast showed Three metastatic deposits in the brain appear smaller. Decreased edema in the left parietal lobe. No new lesions    04/04/2022 Imaging   CT chest w contrast 1. Stable radiation changes in the right perihilar region without evidence of local recurrence. 2. Interval improvement in the patchy ground-glass opacities previously demonstrated in both lungs, most consistent with a resolving infectious or inflammatory process (including immunotherapy-related pneumonitis). 3. No evidence of metastatic disease.   05/14/2022 Miscellaneous   He had a mechanical fall and fractured his left elbow. xrays showed a left elbow fracture  05/26/22 seen by orthopedic surgeon, cast applied.    05/27/2022 Imaging   MRI brain w wo contrast 1. All three known brain metastases have enlarged since August, with increasing regional edema. And there is a small new left anterior cerebellar 4-5 mm metastasis (was questionably punctate on the May MRI). This constellation could reflect a combination of True Progression (Left Cerebellar lesion) and pseudoprogression/radionecrosis (3 existing mets).    2. No significant intracranial mass effect. 3. Underlying moderate cerebral white matter signal changes.   05/30/2022 Imaging   CT chest abdomen pelvis w contrast  1. 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 There is a stable small right pleural effusion, but no definitive findings to suggest residual or recurrent disease on today's examination. 2. No signs of metastatic disease in the abdomen or pelvis.  3. Aortic atherosclerosis, in addition to two-vessel coronary artery disease. Please note that although the presence of coronary artery  calcium documents the presence of coronary artery disease, the severity of this disease and any potential stenosis cannot be assessed on this non-gated CT examination. Assessment for potential risk factor modification, dietary therapy or pharmacologic therapy may be warranted, if clinically indicated. 4. Hepatic steatosis. 5. Colonic diverticulosis without evidence of acute diverticulitis at this time. 6. Incompletely imaged but apparently occluded vascular graft in the superior aspect of the right anterior thigh. Referral to vascular specialist for further clinical evaluation should be considered if clinically appropriate. 7. Additional incidental findings, as above.   07/22/2022 Imaging   MRI brain  1. No new or enlarging lesions. No intracranial disease progression. 2. Decreased size of treated lesions of the left frontoparietal junction and left postcentral gyrus. 3. Unchanged lesions in the left cerebellum and right occipital lobe.    Today he feels well.Intermittent headache, is better. denies any focal deficits. chronic SOB, no chest pain wheezing, cough.  + right lower extremity weakness improved.  +  slurred speech, stable.   Review of Systems  Constitutional:  Positive for fatigue. Negative for appetite change, chills, fever and unexpected weight change.  HENT:   Negative for hearing loss and voice change.   Eyes:  Negative for eye problems and icterus.  Respiratory:  Negative for chest tightness, cough and shortness of breath.   Cardiovascular:  Negative for chest pain and leg swelling.  Gastrointestinal:  Negative for abdominal distention and abdominal pain.  Endocrine: Negative for hot flashes.  Genitourinary:  Negative for difficulty urinating, dysuria and frequency.   Musculoskeletal:  Negative for arthralgias.  Skin:  Negative for itching and rash.  Neurological:  Positive for extremity weakness. Negative for headaches, light-headedness, numbness and speech difficulty.   Hematological:  Negative for adenopathy. Does not bruise/bleed easily.  Psychiatric/Behavioral:  Negative for confusion.       MEDICAL HISTORY:  Past Medical History:  Diagnosis Date   Allergy    Cancer (HCC)    lung   COPD (chronic obstructive pulmonary disease) (HCC)    Dyspnea    former smoker. only doe   History of kidney stones    Hypertension    Peripheral vascular disease (HCC)    Pre-diabetes    Sleep apnea     SURGICAL HISTORY: Past Surgical History:  Procedure Laterality Date   ANTERIOR CERVICAL DECOMP/DISCECTOMY FUSION N/A 03/14/2020   Procedure: ANTERIOR CERVICAL DECOMPRESSION/DISCECTOMY FUSION 3 LEVELS C4-7;  Surgeon: Venetia Night, MD;  Location: [facility] ORS;  Service: Neurosurgery;  Laterality: N/A;   BACK SURGERY  2011   CENTRAL LINE INSERTION Right 09/10/2016   Procedure: CENTRAL LINE INSERTION;  Surgeon: Renford Dills, MD;  Location: [facility] ORS;  Service: Vascular;  Laterality: Right;   CERVICAL FUSION     C4-c7 on Mar 14, 2020   COLONOSCOPY  2013 ?   COLONOSCOPY N/A 09/09/2021   Procedure: COLONOSCOPY;  Surgeon: Jaynie Collins, DO;  Location: [facility] ENDOSCOPY;  Service: Gastroenterology;  Laterality: N/A;   CORONARY ANGIOPLASTY     ESOPHAGOGASTRODUODENOSCOPY N/A 09/09/2021   Procedure: ESOPHAGOGASTRODUODENOSCOPY (EGD);  Surgeon: Jaynie Collins, DO;  Location: [facility] ENDOSCOPY;  Service: Gastroenterology;  Laterality: N/A;   FEMORAL-POPLITEAL BYPASS GRAFT Right 09/10/2016   Procedure: BYPASS GRAFT FEMORAL-POPLITEAL ARTERY ( BELOW KNEE );  Surgeon: Renford Dills, MD;  Location: [facility] ORS;  Service: Vascular;  Laterality: Right;   FEMORAL-TIBIAL BYPASS GRAFT Right 03/23/2020   Procedure: BYPASS GRAFT RIGHT FEMORAL- DISTAL TIBIAL ARTERY WITH DISTAL FLOW GRAFT;  Surgeon: Renford Dills, MD;  Location: [facility] ORS;  Service: Vascular;  Laterality: Right;   LEFT HEART CATH AND CORONARY ANGIOGRAPHY Left 05/16/2021   Procedure: LEFT HEART CATH AND  CORONARY ANGIOGRAPHY;  Surgeon: Armando Reichert, MD;  Location: [facility] INVASIVE CV LAB;  Service: Cardiovascular;  Laterality: Left;   LOWER EXTREMITY ANGIOGRAPHY Right 11/18/2016   Procedure: Lower Extremity Angiography;  Surgeon: Renford Dills, MD;  Location: [facility] INVASIVE CV LAB;  Service: Cardiovascular;  Laterality: Right;   LOWER EXTREMITY ANGIOGRAPHY Right 12/09/2016   Procedure: Lower Extremity Angiography;  Surgeon: Renford Dills, MD;  Location: [facility] INVASIVE CV LAB;  Service: Cardiovascular;  Laterality: Right;   LOWER EXTREMITY ANGIOGRAPHY Right 11/15/2019   Procedure: LOWER EXTREMITY ANGIOGRAPHY;  Surgeon: Renford Dills, MD;  Location: [facility] INVASIVE CV LAB;  Service: Cardiovascular;  Laterality: Right;   LOWER EXTREMITY ANGIOGRAPHY Right 12/01/2019   Procedure: Lower Extremity Angiography;  Surgeon: Annice Needy, MD;  Location: [facility] INVASIVE CV LAB;  Service: Cardiovascular;  Laterality: Right;   LOWER EXTREMITY ANGIOGRAPHY Right 12/02/2019   Procedure: LOWER EXTREMITY ANGIOGRAPHY;  Surgeon: Renford Dills, MD;  Location: [facility] INVASIVE CV LAB;  Service: Cardiovascular;  Laterality: Right;   LOWER EXTREMITY ANGIOGRAPHY Right 03/23/2020   Procedure: Lower Extremity Angiography;  Surgeon: Renford Dills, MD;  Location: [facility] INVASIVE CV LAB;  Service: Cardiovascular;  Laterality: Right;   LOWER EXTREMITY ANGIOGRAPHY Right 09/25/2021   Procedure: Lower Extremity Angiography;  Surgeon: Renford Dills, MD;  Location: [facility] INVASIVE CV LAB;  Service: Cardiovascular;  Laterality: Right;   LOWER EXTREMITY ANGIOGRAPHY Right 09/26/2021   Procedure: Lower Extremity Angiography;  Surgeon: Annice Needy, MD;  Location: [facility] INVASIVE CV LAB;  Service: Cardiovascular;  Laterality: Right;   LOWER EXTREMITY ANGIOGRAPHY Right 04/08/2022   Procedure: Lower Extremity Angiography;  Surgeon: Renford Dills, MD;  Location: [facility] INVASIVE CV LAB;  Service: Cardiovascular;  Laterality: Right;    LOWER EXTREMITY INTERVENTION  11/18/2016   Procedure: Lower Extremity Intervention;  Surgeon: Renford Dills, MD;  Location: [facility] INVASIVE CV LAB;  Service: Cardiovascular;;   PERIPHERAL VASCULAR CATHETERIZATION Right 01/02/2015   Procedure: Lower Extremity Angiography;  Surgeon: Renford Dills, MD;  Location: [facility] INVASIVE CV LAB;  Service: Cardiovascular;  Laterality: Right;   PERIPHERAL VASCULAR CATHETERIZATION Right 06/18/2015   Procedure: Lower Extremity Angiography;  Surgeon: Annice Needy, MD;  Location: [facility] INVASIVE CV LAB;  Service: Cardiovascular;  Laterality: Right;   PERIPHERAL VASCULAR CATHETERIZATION  06/18/2015   Procedure: Lower Extremity Intervention;  Surgeon: Annice Needy, MD;  Location: [facility] INVASIVE CV LAB;  Service: Cardiovascular;;   PERIPHERAL VASCULAR CATHETERIZATION N/A 10/09/2015   Procedure: Abdominal Aortogram w/Lower Extremity;  Surgeon: Renford Dills, MD;  Location: [facility] INVASIVE CV LAB;  Service: Cardiovascular;  Laterality: N/A;   PERIPHERAL VASCULAR CATHETERIZATION  10/09/2015   Procedure: Lower Extremity Intervention;  Surgeon: Renford Dills, MD;  Location: [facility] INVASIVE CV LAB;  Service: Cardiovascular;;   PERIPHERAL VASCULAR CATHETERIZATION Right 10/10/2015   Procedure: Lower Extremity Angiography;  Surgeon: Annice Needy, MD;  Location: [facility] INV[facility] LAB;  Service: Cardiovascular;  Laterality: Right;   PERIPHERAL VASCULAR CATHETERIZATION Right 10/10/2015   Procedure: Lower Extremity Intervention;  Surgeon: Annice Needy, MD;  Location: [facility] INV[facility] CV LAB;  Service: Cardiovascular;  Laterality: Right;   PERIPHERAL VASCULAR CATHETERIZATION Right 12/03/2015   Procedure: Lower Extremity Angiography;  Surgeon: Annice Needy, MD;  Location: [facility] INV[facility] LAB;  Service: Cardiovascular;  Laterality: Right;   PERIPHERAL VASCULAR CATHETERIZATION Right 12/04/2015   Procedure: Lower Extremity Angiography;  Surgeon: Annice Needy, MD;  Location: [facility] INV[facility] LAB;   Service: Cardiovascular;  Laterality: Right;   PERIPHERAL VASCULAR CATHETERIZATION  12/04/2015   Procedure: Lower Extremity Intervention;  Surgeon: Annice Needy, MD;  Location: [facility] IN[facility] LAB;  Service: Cardiovascular;;   PERIPHERAL VASCULAR CATHETERIZATION Right 06/30/2016   Procedure: Lower Extremity Angiography;  Surgeon: Annice Needy, MD;  Location: [facility] INVASIVE CV LAB;  Service: Cardiovascular;  Laterality: Right;   PERIPHERAL VASCULAR CATHETERIZATION Right 06/25/2016   Procedure: Lower Extremity Angiography;  Surgeon: Renford Dills, MD;  Location: [facility] INV[facility] LAB;  Service: Cardiovascular;  Laterality: Right;   PERIPHERAL VASCULAR CATHETERIZATION Right 07/01/2016   Procedure: Lower Extremity Angiography;  Surgeon: Renford Dills, MD;  Location: [facility] INV[facility] CV LAB;  Service: Cardiovascular;  Laterality: Right;   PERIPHERAL VASCULAR CATHETERIZATION  07/01/2016   Procedure: Lower Extremity Intervention;  Surgeon: Renford Dills,  MD;  Location: [facility] INVASIVE CV LAB;  Service: Cardiovascular;;   PERIPHERAL VASCULAR CATHETERIZATION Right 08/01/2016   Procedure: Lower Extremity Angiography;  Surgeon: Renford Dills, MD;  Location: [facility] INVASIVE CV LAB;  Service: Cardiovascular;  Laterality: Right;   PORTA CATH INSERTION N/A 10/05/2020   Procedure: PORTA CATH INSERTION;  Surgeon: Renford Dills, MD;  Location: [facility] INVASIVE CV LAB;  Service: Cardiovascular;  Laterality: N/A;   stent placement in right leg Right    VIDEO BRONCHOSCOPY N/A 09/14/2020   Procedure: VIDEO BRONCHOSCOPY WITH FLUORO;  Surgeon: Salena Saner, MD;  Location: [facility] ORS;  Service: Cardiopulmonary;  Laterality: N/A;   VIDEO BRONCHOSCOPY WITH ENDOBRONCHIAL ULTRASOUND N/A 09/14/2020   Procedure: VIDEO BRONCHOSCOPY WITH ENDOBRONCHIAL ULTRASOUND;  Surgeon: Salena Saner, MD;  Location: [facility] ORS;  Service: Cardiopulmonary;  Laterality: N/A;    SOCIAL HISTORY: Social History   Socioeconomic History    Marital status: Single    Spouse name: Not on file   Number of children: Not on file   Years of education: Not on file   Highest education level: Not on file  Occupational History   Occupation: unemployed  Tobacco Use   Smoking status: Former    Packs/day: 2.00    Years: 38.00    Total pack years: 76.00    Types: Cigarettes    Quit date: 12/20/2014    Years since quitting: 7.6   Smokeless tobacco: Never   Tobacco comments:    quit smoking in 2017 most smoked 2   Vaping Use   Vaping Use: Former  Substance and Sexual Activity   Alcohol use: Not Currently   Drug use: No   Sexual activity: Yes  Other Topics Concern   Not on file  Social History Narrative   Not on file   Social Determinants of Health   Financial Resource Strain: Not on file  Food Insecurity: Not on file  Transportation Needs: Not on file  Physical Activity: Not on file  Stress: Not on file  Social Connections: Not on file  Intimate Partner Violence: Not on file    FAMILY HISTORY: Family History  Problem Relation Age of Onset   Diabetes Mother    Hypertension Mother    Heart murmur Mother    Leukemia Mother    Throat cancer Maternal Grandmother    Colon cancer Neg Hx    Breast cancer Neg Hx     ALLERGIES:  has No Known Allergies.  MEDICATIONS:  Current Outpatient Medications  Medication Sig Dispense Refill   aspirin EC 81 MG tablet Take 81 mg by mouth daily. Swallow whole.     clopidogrel (PLAVIX) 75 MG tablet TAKE 1 TABLET BY MOUTH  DAILY 100 tablet 2   dexamethasone (DECADRON) 1 MG tablet Take 3 tablets (3 mg total) by mouth daily with breakfast for 7 days, THEN 2 tablets (2 mg total) daily with breakfast for 7 days, THEN 1 tablet (1 mg total) daily with breakfast for 7 days. 42 tablet 0   levothyroxine (SYNTHROID) 100 MCG tablet Take 1 tablet (100 mcg total) by mouth daily before breakfast. 30 tablet 0   simvastatin (ZOCOR) 40 MG tablet Take 40 mg by mouth at bedtime.     No current  facility-administered medications for this visit.     PHYSICAL EXAMINATION: ECOG PERFORMANCE STATUS: 1 - Symptomatic but completely ambulatory Vitals:   08/01/22 0911  BP: (!) 162/84  Pulse: 81  Temp: 97.8 F (36.6 C)  SpO2: 100%  Filed Weights   08/01/22 0911  Weight: 216 lb 11.2 oz (98.3 kg)    Physical Exam Constitutional:      General: He is not in acute distress.    Appearance: He is not diaphoretic.     Comments: Patient walks with a cane  HENT:     Head: Normocephalic and atraumatic.     Nose: Nose normal.     Mouth/Throat:     Pharynx: No oropharyngeal exudate.  Eyes:     General: No scleral icterus.    Pupils: Pupils are equal, round, and reactive to light.  Cardiovascular:     Rate and Rhythm: Normal rate and regular rhythm.     Heart sounds: No murmur heard. Pulmonary:     Effort: Pulmonary effort is normal. No respiratory distress.     Breath sounds: No rales.  Chest:     Chest wall: No tenderness.  Abdominal:     General: There is no distension.     Palpations: Abdomen is soft.     Tenderness: There is no abdominal tenderness.  Musculoskeletal:     Cervical back: Normal range of motion and neck supple.  Skin:    General: Skin is warm and dry.     Findings: No erythema.  Neurological:     Mental Status: He is alert and oriented to person, place, and time.     Cranial Nerves: No cranial nerve deficit.     Motor: No abnormal muscle tone.     Coordination: Coordination normal.  Psychiatric:        Mood and Affect: Affect normal.      LABORATORY DATA:  I have reviewed the data as listed    Latest Ref Rng & Units 08/01/2022    8:49 AM 07/18/2022   10:00 AM 07/04/2022    9:01 AM  CBC  WBC 4.0 - 10.5 K/uL 6.7  8.0  7.1   Hemoglobin 13.0 - 17.0 g/dL 13.2  13.2  12.8   Hematocrit 39.0 - 52.0 % 40.1  40.6  40.0   Platelets 150 - 400 K/uL 182  166  177       Latest Ref Rng & Units 08/01/2022    8:49 AM 07/18/2022   10:00 AM 07/04/2022     9:01 AM  CMP  Glucose 70 - 99 mg/dL 111  100  98   BUN 8 - 23 mg/dL 26  27  23    Creatinine 0.61 - 1.24 mg/dL 1.15  1.08  1.07   Sodium 135 - 145 mmol/L 139  138  139   Potassium 3.5 - 5.1 mmol/L 3.6  3.9  4.2   Chloride 98 - 111 mmol/L 105  102  102   CO2 22 - 32 mmol/L 25  28  26    Calcium 8.9 - 10.3 mg/dL 8.6  9.0  9.0   Total Protein 6.5 - 8.1 g/dL 6.4  6.7  6.8   Total Bilirubin 0.3 - 1.2 mg/dL 0.2  0.5  0.6   Alkaline Phos 38 - 126 U/L 52  50  55   AST 15 - 41 U/L 29  24  24    ALT 0 - 44 U/L 50  49  31      RADIOGRAPHIC STUDIES: I have personally reviewed the radiological images as listed and agreed with the findings in the report. MR BRAIN W WO CONTRAST  Result Date: 07/22/2022 CLINICAL DATA:  Lung carcinoma, treated metastatic disease. Stereotactic radio surgery. EXAM: MRI  HEAD WITHOUT AND WITH CONTRAST TECHNIQUE: Multiplanar, multiecho pulse sequences of the brain and surrounding structures were obtained without and with intravenous contrast. CONTRAST:  43mL GADAVIST GADOBUTROL 1 MMOL/ML IV SOLN COMPARISON:  05/23/2022 brain MRI FINDINGS: Brain: No acute infarct or acute hemorrhage. No hydrocephalus or midline shift. There is multifocal hyperintense T2-weighted signal within the periventricular and deep white matter. There are no new or larger lesions. There are 4 lesions that are unchanged or smaller: 1. Left cerebellum, 2 mm, unchanged.  Series 1,023 image 162 2. Right occipital lobe, 8 mm, unchanged, image 137 3. Left frontoparietal junction, 20 mm pelvis slightly smaller. Markedly decreased contrast enhancement and decreased surrounding hyperintense T2-weighted signal. Image 93 4. Left postcentral gyrus, 7 mm, smaller, image 52 Vascular: Normal flow voids. Skull and upper cervical spine: Normal bone marrow signal. Sinuses/Orbits: Normal orbits. Paranasal sinuses and mastoids are normal. Other: None IMPRESSION: 1. No new or enlarging lesions. No intracranial disease progression. 2.  Decreased size of treated lesions of the left frontoparietal junction and left postcentral gyrus. 3. Unchanged lesions in the left cerebellum and right occipital lobe. Electronically Signed   By: Ulyses Jarred M.D.   On: 07/22/2022 23:04   NM Bone Scan Whole Body  Result Date: 06/10/2022 CLINICAL DATA:  Squamous cell carcinoma of the lung EXAM: NUCLEAR MEDICINE WHOLE BODY BONE SCAN TECHNIQUE: Whole body anterior and posterior images were obtained approximately 3 hours after intravenous injection of radiopharmaceutical. RADIOPHARMACEUTICALS:  20.51 mCi Technetium-63m MDP IV COMPARISON:  None Available Radiographic correlation: CT chest abdomen pelvis 05/28/2022 FINDINGS: Significant uptake at LEFT elbow corresponding to known fracture. Due to limitations of elbow positioning, LEFT hand projects over LEFT iliac wing on anterior view. Uptake at adjacent posterior LEFT tenth eleventh twelfth ribs, consistent with healing fractures. Uptake at costovertebral junction LEFT seventh rib corresponding to degenerative changes on CT. No worrisome sites of tracer uptake to suggest osseous metastatic disease. Portions of pelvis obscured by retained tracer within urinary bladder. Expected urinary tract and soft tissue distribution of tracer otherwise identified. IMPRESSION: Degenerative and posttraumatic type uptake as above. No scintigraphic evidence of osseous metastatic disease. Electronically Signed   By: Lavonia Dana M.D.   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 Follow-up study. * Tracking Code: BO * EXAM: CT CHEST, ABDOMEN, AND PELVIS WITH CONTRAST TECHNIQUE: Multidetector CT imaging of the chest, abdomen and pelvis was performed following the standard protocol during bolus administration of intravenous contrast.  RADIATION DOSE REDUCTION: This exam was performed according to the departmental dose-optimization program which includes automated exposure control, adjustment of the mA and/or kV according to patient size and/or use of iterative reconstruction technique. CONTRAST:  152mL OMNIPAQUE IOHEXOL 300 MG/ML  SOLN COMPARISON:  Chest CT 04/04/2022. CT of the chest, abdomen and pelvis 01/17/2022. FINDINGS: CT CHEST FINDINGS Cardiovascular: Heart size is normal. There is no significant pericardial fluid, thickening or pericardial calcification. There is aortic atherosclerosis, as well as atherosclerosis of the great vessels of the mediastinum and the coronary arteries, including calcified atherosclerotic plaque in the left anterior descending and right coronary arteries. Mediastinum/Nodes: No pathologically enlarged mediastinal or hilar lymph nodes. Esophagus is unremarkable in appearance. No axillary lymphadenopathy. Lungs/Pleura: Chronic mass-like architectural distortion and volume loss is noted in the perihilar aspect of the right mid to upper lung, similar to prior  examinations, most compatible with areas of chronic postradiation mass-like fibrosis. The surrounding regions of ground-glass attenuation and septal thickening seen on prior examinations persist, but have decreased compared to the prior study, most likely to reflect resolving postradiation pneumonitis. Trace right pleural effusion lying dependently, similar to the prior study. No left pleural effusion. No new acute consolidative airspace disease. No definite suspicious appearing pulmonary nodules or masses are noted. Musculoskeletal: Orthopedic fixation hardware in the lower cervical spine incompletely imaged. There are no aggressive appearing lytic or blastic lesions noted in the visualized portions of the skeleton. CT ABDOMEN PELVIS FINDINGS Hepatobiliary: Diffuse low attenuation throughout the hepatic parenchyma, indicative of a background of hepatic  steatosis. No suspicious cystic or solid hepatic lesions. No intra or extrahepatic biliary ductal dilatation. Gallbladder is normal in appearance. Pancreas: No pancreatic mass. No pancreatic ductal dilatation. No pancreatic or peripancreatic fluid collections or inflammatory changes. Spleen: Unremarkable. Adrenals/Urinary Tract: Bilateral kidneys and bilateral adrenal glands are normal in appearance. No hydroureteronephrosis. Urinary bladder is unremarkable in appearance. Stomach/Bowel: The appearance of the stomach is normal. No pathologic dilatation of small bowel or colon. Numerous colonic diverticuli are noted, particularly in the sigmoid colon, without surrounding inflammatory changes to suggest an acute diverticulitis at this time. Normal appendix. Vascular/Lymphatic: Atherosclerosis throughout the abdominal aorta and pelvic vasculature. Postoperative changes in the upper right thigh incompletely imaged, with what appears to be a vascular graft bifurcating in the anterior aspect of the upper right thigh, with both limbs of the graft apparently occluded on today's examination. Reproductive: Prostate gland and seminal vesicles are unremarkable in appearance. Other: No significant volume of ascites.  No pneumoperitoneum. Musculoskeletal: Status post PLIF at L5-S1. There are no aggressive appearing lytic or blastic lesions noted in the visualized portions of the skeleton. IMPRESSION: 1. 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 There is a stable small right pleural effusion, but no definitive findings to suggest residual or recurrent disease on today's examination. 2. No signs of metastatic disease in the abdomen or pelvis. 3. Aortic atherosclerosis, in addition to two-vessel coronary artery disease. Please note that although the presence of coronary artery calcium documents the presence of  coronary artery disease, the severity of this disease and any potential stenosis cannot be assessed on this non-gated CT examination. Assessment for potential risk factor modification, dietary therapy or pharmacologic therapy may be warranted, if clinically indicated. 4. Hepatic steatosis. 5. Colonic diverticulosis without evidence of acute diverticulitis at this time. 6. Incompletely imaged but apparently occluded vascular graft in the superior aspect of the right anterior thigh. Referral to vascular specialist for further clinical evaluation should be considered if clinically appropriate. 7. Additional incidental findings, as above. Electronically Signed   By: Vinnie Langton M.D.   On: 05/30/2022 08:04   MR BRAIN W WO CONTRAST  Result Date: 05/27/2022 CLINICAL DATA:  67 year old male with metastatic lung cancer. Three known brain metastases. Status post brain radiation, reportedly SRS. Restaging. EXAM: MRI HEAD WITHOUT AND WITH CONTRAST TECHNIQUE: Multiplanar, multiecho pulse sequences of the brain and surrounding structures were obtained without and with intravenous contrast. CONTRAST:  71mL GADAVIST GADOBUTROL 1 MMOL/ML IV SOLN COMPARISON:  Brain MRI 02/19/2022 and earlier. FINDINGS: Brain: Larger rim enhancing metastasis of the left parietal lobe since August, now 10 mm diameter (3 mm previously. See series 18, image 144. Regional FLAIR hyperintensity is mild but also progressed. Dominant left frontal operculum metastasis has also increased in  size and heterogeneously enhancing on series 18, image 104 up to 21 mm diameter now (previously 6 mm). Significant progression of confluent surrounding T2 and FLAIR hyperintensity compatible with vasogenic edema. Much of the enhancing mass is seen to be T2 hypointense on series 10, image 17 and there are some diffusion signal changes. Only minor regional mass effect including on the left lateral ventricle. Progression also of the small right occipital lobe white  matter metastasis now 10 mm diameter (previously 4 mm) and increased, moderate regional T2 and FLAIR hyperintense vasogenic edema (series 15, image 21). No significant regional mass effect. New small 4-5 mm solid enhancing left anterior cerebellar metastasis on series 18, image 34. Mild regional T2 and FLAIR hyperintense edema without mass effect (series 15, image 10). This lesion might have been punctate on 12/10/2021, uncertain. No other enhancing metastasis identified. No superimposed restricted diffusion suggestive of acute infarction. No midline shift, ventriculomegaly, extra-axial collection or acute intracranial hemorrhage. Cervicomedullary junction and pituitary are within normal limits. Widely scattered and patchy additional bilateral cerebral white matter nonspecific T2 and FLAIR hyperintensity appears stable since May. Vascular: Major intracranial vascular flow voids are stable. Dominant distal left vertebral artery. Major dural venous sinuses are enhancing and appear to be patent. Skull and upper cervical spine: Visualized bone marrow signal is within normal limits. Negative visible cervical spine. Sinuses/Orbits: Stable and negative. Other: Visible internal auditory structures appear normal. Negative visible scalp and face. IMPRESSION: 1. All three known brain metastases have enlarged since August, with increasing regional edema. And there is a small new left anterior cerebellar 4-5 mm metastasis (was questionably punctate on the May MRI). This constellation could reflect a combination of True Progression (Left Cerebellar lesion) and pseudoprogression/radionecrosis (3 existing mets). 2. No significant intracranial mass effect. 3. Underlying moderate cerebral white matter signal changes. Electronically Signed   By: Odessa Fleming M.D.   On: 05/27/2022 05:45   CT ELBOW LEFT WO CONTRAST  Result Date: 05/21/2022 CLINICAL DATA:  Fall, follow-up left elbow x-rays EXAM: CT OF THE UPPER LEFT EXTREMITY WITHOUT  CONTRAST TECHNIQUE: Multidetector CT imaging of the upper left extremity was performed according to the standard protocol. RADIATION DOSE REDUCTION: This exam was performed according to the departmental dose-optimization program which includes automated exposure control, adjustment of the mA and/or kV according to patient size and/or use of iterative reconstruction technique. COMPARISON:  Left elbow radiograph 05/14/2022 FINDINGS: Bones/Joint/Cartilage There is a minimally displaced supracondylar fracture of the humerus with associated moderate-sized joint effusion. There is up to 4 mm cortical offset at the medial margin. No condylar split component. Ligaments Suboptimally assessed by CT. Muscles and Tendons No acute myotendinous abnormality or intramuscular collection on noncontrast CT. Soft tissues There is soft tissue swelling along the elbow. IMPRESSION: Minimally displaced supracondylar fracture of the distal humerus. Moderate-sized joint effusion. Electronically Signed   By: Caprice Renshaw M.D.   On: 05/21/2022 13:26   DG Elbow Complete Left  Result Date: 05/14/2022 CLINICAL DATA:  Larey Seat today with elbow pain EXAM: LEFT ELBOW - COMPLETE 3+ VIEW COMPARISON:  None Available. FINDINGS: Supracondylar fracture of the humerus without angulation or displacement. Joint effusion is present. No fracture of the radius or ulna. IMPRESSION: Supracondylar fracture of the humerus without angulation or displacement. Electronically Signed   By: Paulina Fusi M.D.   On: 05/14/2022 14:32   VAS [place] ABI WITH/WO TBI  Result Date: 05/12/2022  LOWER EXTREMITY DOPPLER STUDY Patient Name:  [name]  Date of Exam:  05/05/2022 Medical Rec #: [id number]            Accession #:    [id number] Date of Birth: [birth year]           Patient Gender: M Patient Age:   51 years Exam Location:  [facility] Procedure:      VAS [place] ABI WITH/WO TBI Referring Phys:  --------------------------------------------------------------------------------  Indications: Claudication, peripheral artery disease, and Routine f/u. High Risk Factors: Hyperlipidemia. Other Factors: ASO s/p multiple interventions and bypass.                11/15/2019 Right thrombectomy of Fempop insitu graft with added                stent throughout.                 12/01/2019: Aortogram and Selctive Right Lower Extremity                Angiogram. 6 mg of TPA delivered in a lysis catheterfrom the                Origin of the Right Femoral -Popliteal bypass down the proximal                ATA with placement of the lysis.                 12/02/2019: PTA of the Right Posterior Tibial Artery. PTA of the                Right Peroneal Artery. Mechanical Thrombectomy of the Right                Femoral -Popliteal Bypass graft. Mechanical Thrombectomy of the                Right Tibioperoneal Trunk and Posterior Tibial Artery.                 09/25/2021: Right Lower Extremity Angiography third order                catheter placement. PTA of the proximal peroneal and                tibioperoneal trunk to 2.5 mm with an ultra score balloon. PTA of                the Femoral Artery and the proximal anstomosis of the bypass                graft to 4 mm with an ultra score balloon. Administration of 5 mg                of tpa throughout the entire bypass and in the peroneal Artery.                Initiation of continuous tpa infusion Right lower Extremity for                limb salvage. Insertion of a triple lumen catheter Left CFV with                [place] and fluoroscopic guidance.                 09/26/2021: Right LE Angiogram. PTA of the Right Posterior Tibial                Artery with 2.5 mm diameter by 30 cm length angioplasty balloon.  PTA of the distal bypass anastomosis and distal bypass graft                analogous to the Popliteal artery with 4 mm diameter by 8 cm                length Lutonix drug  coated angioplasty balloon.                04/08/2022 right thrombectomy of the Deep profunda.  Vascular Interventions: Hx of multiple right lower extremity PTA's/stents.                         09/10/2016 Right femoral to below the knee popliteal                         artery bypass graft placement. 5/01 Right bypass graft &                         mid popliteal artery PTA & coil embolectomy of large                         branch of bypass graft. 12/09/16 Right distal bypass                         graft PTA/stent and right posterior tibial artery PTA.                         03/23/2020 Rt PTA thrombectomy. Rt Profunda fem artery to                         PTA bypass. Comparison Study: 03/10/2022 Performing Technologist: Salvadore Farber RVT  Examination Guidelines: A complete evaluation includes at minimum, Doppler waveform signals and systolic blood pressure reading at the level of bilateral brachial, anterior tibial, and posterior tibial arteries, when vessel segments are accessible. Bilateral testing is considered an integral part of a complete examination. Photoelectric Plethysmograph (PPG) waveforms and toe systolic pressure readings are included as required and additional duplex testing as needed. Limited examinations for reoccurring indications may be performed as noted.  ABI Findings: +---------+------------------+-----+----------+--------+ Right    Rt Pressure (mmHg)IndexWaveform  Comment  +---------+------------------+-----+----------+--------+ Brachial 135                                       +---------+------------------+-----+----------+--------+ ATA                             absent             +---------+------------------+-----+----------+--------+ PTA      72                0.53 monophasic         +---------+------------------+-----+----------+--------+ PERO     88                0.65 monophasic         +---------+------------------+-----+----------+--------+ Great Toe36                 0.27 Abnormal           +---------+------------------+-----+----------+--------+ +---------+------------------+-----+---------+-------+ Left     Lt Pressure (mmHg)IndexWaveform Comment +---------+------------------+-----+---------+-------+  Brachial 135                                     +---------+------------------+-----+---------+-------+ ATA      160               1.19 triphasic        +---------+------------------+-----+---------+-------+ PTA      146               1.08 triphasic        +---------+------------------+-----+---------+-------+ Great Toe155               1.15 Normal           +---------+------------------+-----+---------+-------+ +-------+-----------+-----------+------------+------------+ ABI/TBIToday's ABIToday's TBIPrevious ABIPrevious TBI +-------+-----------+-----------+------------+------------+ Right  .65        .27        .28         .00          +-------+-----------+-----------+------------+------------+ Left   1.19       1.15       1.21        .96          +-------+-----------+-----------+------------+------------+ Right ABIs and TBIs appear increased compared to prior study on 02/2022.  Summary: Right: Resting right ankle-brachial index indicates moderate right lower extremity arterial disease. The right toe-brachial index is abnormal. Left: Resting left ankle-brachial index is within normal range. The left toe-brachial index is normal. *See table(s) above for measurements and observations.  Electronically signed by Levora Dredge MD on 05/12/2022 at 1:04:36 PM.    Final

## 2022-08-02 NOTE — Assessment & Plan Note (Signed)
Recommend patient to take Calcium 1200mg  daily.

## 2022-08-06 ENCOUNTER — Telehealth: Payer: Self-pay | Admitting: Pulmonary Disease

## 2022-08-06 MED ORDER — ALBUTEROL SULFATE HFA 108 (90 BASE) MCG/ACT IN AERS: 2.0000 | INHALATION_SPRAY | Freq: Four times a day (QID) | RESPIRATORY_TRACT | 2 refills | Status: DC | PRN

## 2022-08-06 NOTE — Telephone Encounter (Signed)
Spoke to patient and relayed below message/recommendations.  He would like to switch to albuterol. Rx has been sent to preferred pharmacy. Nothing further needed.

## 2022-08-06 NOTE — Telephone Encounter (Signed)
Called and spoke to patient. He would like to switch Bevespi to Ventolin, as Bevespi is not affordable. He is aware that these medications are not the same. He still wishes to switch.   Dr. Jayme Cloud, please advise. Thanks

## 2022-08-06 NOTE — Telephone Encounter (Signed)
Albuterol is only for as needed use.  We can assist him with [employer] medication assistance if he would like to try that and be able to get the medication.  If he is not interested in that we can switch to albuterol as needed but it is not going to be as effective.  Albuterol is only as single short acting medication whereas Bevespi has 2 medications that are long-acting.

## 2022-08-11 ENCOUNTER — Other Ambulatory Visit: Payer: Self-pay | Admitting: Radiation Therapy

## 2022-08-15 ENCOUNTER — Encounter: Payer: Self-pay | Admitting: Oncology

## 2022-08-15 ENCOUNTER — Inpatient Hospital Stay: Payer: Medicare Other

## 2022-08-15 ENCOUNTER — Inpatient Hospital Stay (HOSPITAL_BASED_OUTPATIENT_CLINIC_OR_DEPARTMENT_OTHER): Payer: Medicare Other | Admitting: Oncology

## 2022-08-15 VITALS — BP 151/80 | HR 93 | Temp 97.2°F | Wt 220.6 lb

## 2022-08-15 DIAGNOSIS — I739 Peripheral vascular disease, unspecified: Secondary | ICD-10-CM

## 2022-08-15 DIAGNOSIS — C7931 Secondary malignant neoplasm of brain: Secondary | ICD-10-CM

## 2022-08-15 DIAGNOSIS — C3491 Malignant neoplasm of unspecified part of right bronchus or lung: Secondary | ICD-10-CM | POA: Diagnosis not present

## 2022-08-15 DIAGNOSIS — Z5112 Encounter for antineoplastic immunotherapy: Secondary | ICD-10-CM | POA: Diagnosis not present

## 2022-08-15 DIAGNOSIS — E032 Hypothyroidism due to medicaments and other exogenous substances: Secondary | ICD-10-CM

## 2022-08-15 LAB — COMPREHENSIVE METABOLIC PANEL
ALT: 38 U/L (ref 0–44)
AST: 27 U/L (ref 15–41)
Albumin: 3.7 g/dL (ref 3.5–5.0)
Alkaline Phosphatase: 54 U/L (ref 38–126)
Anion gap: 10 (ref 5–15)
BUN: 21 mg/dL (ref 8–23)
CO2: 25 mmol/L (ref 22–32)
Calcium: 9.2 mg/dL (ref 8.9–10.3)
Chloride: 105 mmol/L (ref 98–111)
Creatinine, Ser: 1.03 mg/dL (ref 0.61–1.24)
GFR, Estimated: >60 mL/min (ref >60)
Glucose, Bld: 106 mg/dL — ABNORMAL HIGH (ref 70–99)
Potassium: 3.8 mmol/L (ref 3.5–5.1)
Sodium: 140 mmol/L (ref 135–145)
Total Bilirubin: 0.3 mg/dL (ref 0.3–1.2)
Total Protein: 6.7 g/dL (ref 6.5–8.1)

## 2022-08-15 LAB — CBC WITH DIFFERENTIAL/PLATELET
Abs Immature Granulocytes: 0.03 10*3/uL (ref 0.00–0.07)
Basophils Absolute: 0 10*3/uL (ref 0.0–0.1)
Basophils Relative: 1 %
Eosinophils Absolute: 0 10*3/uL (ref 0.0–0.5)
Eosinophils Relative: 1 %
HCT: 38.5 % — ABNORMAL LOW (ref 39.0–52.0)
Hemoglobin: 12.6 g/dL — ABNORMAL LOW (ref 13.0–17.0)
Immature Granulocytes: 1 %
Lymphocytes Relative: 17 %
Lymphs Abs: 0.8 10*3/uL (ref 0.7–4.0)
MCH: 27.8 pg (ref 26.0–34.0)
MCHC: 32.7 g/dL (ref 30.0–36.0)
MCV: 84.8 fL (ref 80.0–100.0)
Monocytes Absolute: 0.5 10*3/uL (ref 0.1–1.0)
Monocytes Relative: 11 %
Neutro Abs: 3.2 10*3/uL (ref 1.7–7.7)
Neutrophils Relative %: 69 %
Platelets: 163 10*3/uL (ref 150–400)
RBC: 4.54 MIL/uL (ref 4.22–5.81)
RDW: 16.4 % — ABNORMAL HIGH (ref 11.5–15.5)
WBC: 4.6 10*3/uL (ref 4.0–10.5)
nRBC: 0 % (ref 0.0–0.2)

## 2022-08-15 LAB — TSH: TSH: 52.807 u[IU]/mL — ABNORMAL HIGH (ref 0.350–4.500)

## 2022-08-15 LAB — T4, FREE: Free T4: 0.77 ng/dL (ref 0.61–1.12)

## 2022-08-15 MED ORDER — SODIUM CHLORIDE 0.9 % IV SOLN
Freq: Once | INTRAVENOUS | Status: AC
  Filled 2022-08-15: qty 250

## 2022-08-15 MED ORDER — HEPARIN SOD (PORK) LOCK FLUSH 100 UNIT/ML IV SOLN
500.0000 [IU] | Freq: Once | INTRAVENOUS | Status: AC | PRN
  Administered 2022-08-15: 500 [IU]
  Filled 2022-08-15: qty 5

## 2022-08-15 MED ORDER — LEVOTHYROXINE SODIUM 100 MCG PO TABS: 100.0000 ug | ORAL_TABLET | Freq: Every day | ORAL | 0 refills | Status: DC

## 2022-08-15 MED ORDER — SODIUM CHLORIDE 0.9 % IV SOLN
10.0000 mg/kg | Freq: Once | INTRAVENOUS | Status: AC
  Administered 2022-08-15: 1000 mg via INTRAVENOUS
  Filled 2022-08-15: qty 20

## 2022-08-15 NOTE — Assessment & Plan Note (Signed)
Continue Plavix and follow up with vascular surgery. He is off Eliquis CT showed likely  occluded vascular graft in the superior aspect of the right anterior thigh.  I have previously discussed with Dr.Schnier

## 2022-08-15 NOTE — Assessment & Plan Note (Signed)
Synthroid 100 mcg daily

## 2022-08-15 NOTE — Assessment & Plan Note (Signed)
Recommend patient to take Calcium 1200mg  daily.

## 2022-08-15 NOTE — Progress Notes (Signed)
Hematology/Oncology Progress note [phone] [phone]) [phone]    ASSESSMENT & PLAN:   Cancer Staging  Squamous cell carcinoma of right lung [facility]) Staging form: Lung, AJCC 8th Edition - Clinical stage from 09/20/2020: Stage IV (cT1b, cN2, cM1) - Signed by [name], MD on 12/13/2021   Squamous cell carcinoma of right lung ([place]) Durvalumab was previously held due to pneumonitis.  Repeat CT scan showed improvement of patchy ground-glass opacities.  Reviewed and discussed with patient CT chest abdomen pelvis showed no progression. Labs reviewed and discussed with patient. Proceed with Durvalumab Repeat CT  Hypocalcemia Recommend patient to take Calcium 1200mg  daily.   Hypothyroidism due to medication Synthroid 100 mcg daily   Malignant neoplasm metastatic to brain [facility]) Status post radiation,  02/19/22 MRI showed decreased brain lesion size and edema.  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   The small left anterior cerebellar lesion is stable. I have communicated with Dr.Chrystal, he recommends no radiation of left cerebellar lesion. Continue observation.      PVD (peripheral vascular disease) (HCC) Continue Plavix and follow up with vascular surgery. He is off Eliquis CT showed likely  occluded vascular graft in the superior aspect of the right anterior thigh.  I have previously discussed with Dr.Schnier     Orders Placed This Encounter  Procedures   CT CHEST ABDOMEN PELVIS W CONTRAST    Standing Status:   Future    Standing Expiration Date:   08/16/2023    Order Specific Question:   If indicated for the ordered procedure, I authorize the administration of contrast media per Radiology protocol    Answer:   Yes    Order  Specific Question:   Does the patient have a contrast media/X-ray dye allergy?    Answer:   No    Order Specific Question:   Preferred imaging location?    Answer:   [facility]    Order Specific Question:   Is Oral Contrast requested for this exam?    Answer:   Yes, Per Radiology protocol   Follow up lab MD 2 weeks Durvalumab All questions were answered. The patient knows to call the clinic with any problems, questions or concerns.  [name], MD, PhD [facility] Hematology Oncology 08/15/2022      CHIEF COMPLAINTS/REASON FOR VISIT:  Follow up for lung cancer  HISTORY OF PRESENTING ILLNESS:  Joshua [name] is a 67 y.o. male who has above history reviewed by me today presents for follow up visit for Stage IV lung squamous cell carcinoma. Oncology History  Squamous cell carcinoma of right lung ([place])  09/18/2020 Imaging   PET scan showed right upper lobe nodule maximal SUV 11.5.  Hypermetabolic cluster right paratracheal adenopathy with maximum SUV up to 12.  No findings of metastatic disease to the neck/abdomen/pelvis or skeleton.   09/20/2020 Initial Diagnosis   Squamous cell carcinoma of right lung (HCC) NGS showed PD-L1 TPS 95%, LRP1B S928fs, PIK3CA E545K, RB1 c1422-2A>T, RIT1 T83_ A84del, STK11 P281fs, TPS53 R248W, TMB 19.67mut/mb, MS stable.    09/20/2020 Cancer Staging   Staging form: Lung, AJCC 8th Edition - Clinical stage from 09/20/2020: Stage IV (cT1b, cN2, cM1) - Signed by [name], MD on 12/13/2021   10/03/2020 Imaging   MRI brain with and without contrast  showed no evidence of intracranial metastatic disease. Well-defined round lesion within the left parotid tail measuring approximately 2.3 x 1.8 x 1.8 cm   10/09/2020 - 11/27/2020 Radiation Therapy   concurrent chemoradiation   10/10/2020 - 10/31/2020 Chemotherapy   10/10/2020 concurrent chemoradiation.  carboplatin AUC of 2 and Taxol 45 mg/m2 x 4.  Additional chemotherapy was held due to thrombocytopenia,  neutropenia       12/31/2020 - 03/21/2022 Chemotherapy   Maintenance  Durvalumab q14d      12/31/2020 -  Chemotherapy   Patient is on Treatment Plan : LUNG Durvalumab (10) q14d     03/28/2021 Imaging   CT without contrast showed new linear right perihilar consolidation with associated traction bronchiectasis.  Likely postradiation changes.  Additional new peribronchial vascular/nodular groundglass opacities with areas of consolidation or seen primarily in the right upper and lower lobes, more distant from expected radiation fields. 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  Per my discussion with pulmonology Dr. 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     05/09/2021 Imaging   Repeat CT chest without contrast showed evolving radiation changes in the right perihilar region likely accounting for interval increase to thickening of the posterior wall of the right upper lobe bronchus.  The more peripheral right lung groundglass opacities seen on most recent study have generally improved with exception of one peripherally right upper lobe which appears new.  These are likely inflammatory.  No findings highly suspicious for local recurrence.   05/30/2021 - 01/10/2022 Chemotherapy   Durvalumab q14d     10/07/2021 Imaging   MRI brain with and without contrast showed 2 contrast-enhancing lesions, most consistent with metastatic disease.  Largest lesion is in the left frontal lobe and measures up to 1.3 cm with a large amount of surrounding edema and 9 mm rightward midline shift.  The other enhancing lesion was 4 mm in the right occipital lobe with a small amount of surrounding edema.  No acute or chronic hemorrhage.   10/07/2021 Progression   Headache after colonoscopy, progressive difficulty using her right arm.  Progressively worsening of speech. Stage IV lung cancer with  brain metastasis.  started on Dexamethasone.  He was seen by neurosurgery and radiation oncology.  Recommendation is to proceed with SRS.  Patient was discharged with dexamethasone   10/16/2021 Imaging   PET scan showed postradiation changes in the right supra hilar lung without evidence of local recurrence.  No evidence of lung cancer recurrence or metastatic disease.  Single focus of intense metabolic activity associated with posterior left 11th rib likely secondary to recent fall/posttraumatic metabolic changes.  Frontal  uptake in the left frontal lobe corresponding to the known metastatic lesion in the brain   10/29/2021 -  Radiation Therapy   Brain Radiation [facility]   12/10/2021 Imaging   Brain MRI: Continued interval decrease in size of a left frontal lobe metastasis, now measuring 10 mm. Moderate surrounding edema has also decreased.  3-4 mm metastasis within the right occipital lobe, not significantly changed in size. There is now a small focus of central non enhancement within this lesion. Increased edema. 3 mm enhancing metastasis within the high left parietal lobe. In retrospect, this was likely present as a subtle punctate focus of enhancement on the prior brain MRI of 10/22/2021.   01/18/2022 Imaging   CT chest abdomen pelvis w contrast 1. Stable post treatment changes  in the right upper lobe medially with dense radiation fibrosis but no findings to suggest local recurrent tumor. 2. Significant progression of ill-defined ground-glass opacity, interstitial thickening and airspace nodularity in the right upper lobe and upper aspect of the right lower lobe. Findings could reflect an inflammatory or atypical infectious process, drug induced pneumonitis or interstitial spread of tumor.3. No mediastinal or hilar mass or adenopathy. 4. No findings for abdominal/pelvic metastatic disease or osseous metastatic disease.   02/19/2022 Imaging   MRI brain w wo contrast showed Three metastatic deposits in  the brain appear smaller. Decreased edema in the left parietal lobe. No new lesions    04/04/2022 Imaging   CT chest w contrast 1. Stable radiation changes in the right perihilar region without evidence of local recurrence. 2. Interval improvement in the patchy ground-glass opacities previously demonstrated in both lungs, most consistent with a resolving infectious or inflammatory process (including immunotherapy-related pneumonitis). 3. No evidence of metastatic disease.   05/14/2022 Miscellaneous   He had a mechanical fall and fractured his left elbow. xrays showed a left elbow fracture  05/26/22 seen by orthopedic surgeon, cast applied.    05/27/2022 Imaging   MRI brain w wo contrast 1. All three known brain metastases have enlarged since August, with increasing regional edema. And there is a small new left anterior cerebellar 4-5 mm metastasis (was questionably punctate on the May MRI). This constellation could reflect a combination of True Progression (Left Cerebellar lesion) and pseudoprogression/radionecrosis (3 existing mets).    2. No significant intracranial mass effect. 3. Underlying moderate cerebral white matter signal changes.   05/30/2022 Imaging   CT chest abdomen pelvis w contrast  1. 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 There is a stable small right pleural effusion, but no definitive findings to suggest residual or recurrent disease on today's examination. 2. No signs of metastatic disease in the abdomen or pelvis.  3. Aortic atherosclerosis, in addition to two-vessel coronary artery disease. Please note that although the presence of coronary artery calcium documents the presence of coronary artery disease, the severity of this disease and any potential stenosis cannot be assessed on this non-gated CT examination. Assessment for potential risk  factor modification, dietary therapy or pharmacologic therapy may be warranted, if clinically indicated. 4. Hepatic steatosis. 5. Colonic diverticulosis without evidence of acute diverticulitis at this time. 6. Incompletely imaged but apparently occluded vascular graft in the superior aspect of the right anterior thigh. Referral to vascular specialist for further clinical evaluation should be considered if clinically appropriate. 7. Additional incidental findings, as above.   07/22/2022 Imaging   MRI brain  1. No new or enlarging lesions. No intracranial disease progression. 2. Decreased size of treated lesions of the left frontoparietal junction and left postcentral gyrus. 3. Unchanged lesions in the left cerebellum and right occipital lobe.    Today he feels well.Intermittent headache, is better. denies any focal deficits. chronic SOB, no chest pain wheezing, cough.  + right lower extremity weakness improved.  + slurred speech, stable.   Review of Systems  Constitutional:  Positive for fatigue. Negative for appetite change, chills, fever and unexpected weight change.  HENT:   Negative for hearing loss and voice change.   Eyes:  Negative for eye problems and icterus.  Respiratory:  Negative for chest tightness, cough and shortness of breath.   Cardiovascular:  Negative for chest pain and leg swelling.  Gastrointestinal:  Negative for abdominal distention and abdominal pain.  Endocrine: Negative for hot flashes.  Genitourinary:  Negative for difficulty urinating, dysuria and frequency.   Musculoskeletal:  Negative for arthralgias.  Skin:  Negative for itching and rash.  Neurological:  Positive for extremity weakness. Negative for headaches, light-headedness, numbness and speech difficulty.  Hematological:  Negative for adenopathy. Does not bruise/bleed easily.  Psychiatric/Behavioral:  Negative for confusion.       MEDICAL HISTORY:  Past Medical History:  Diagnosis Date   Allergy     Cancer ([place])    lung   COPD (chronic obstructive pulmonary disease) (HCC)    Dyspnea    former smoker. only doe   History of kidney stones    Hypertension    Peripheral vascular disease ([place])    Pre-diabetes    Sleep apnea     SURGICAL HISTORY: Past Surgical History:  Procedure Laterality Date   ANTERIOR CERVICAL DECOMP/DISCECTOMY FUSION N/A 03/14/2020   Procedure: ANTERIOR CERVICAL DECOMPRESSION/DISCECTOMY FUSION 3 LEVELS C4-7;  Surgeon: Meade Maw, MD;  Location: [facility] ORS;  Service: Neurosurgery;  Laterality: N/A;   BACK SURGERY  2011   CENTRAL LINE INSERTION Right 09/10/2016   Procedure: CENTRAL LINE INSERTION;  Surgeon: Katha Cabal, MD;  Location: [facility] ORS;  Service: Vascular;  Laterality: Right;   CERVICAL FUSION     C4-c7 on Mar 14, 2020   COLONOSCOPY  2013 ?   COLONOSCOPY N/A 09/09/2021   Procedure: COLONOSCOPY;  Surgeon: [name], DO;  Location: [facility] ENDOSCOPY;  Service: Gastroenterology;  Laterality: N/A;   CORONARY ANGIOPLASTY     ESOPHAGOGASTRODUODENOSCOPY N/A 09/09/2021   Procedure: ESOPHAGOGASTRODUODENOSCOPY (EGD);  Surgeon: [name], DO;  Location: [facility] ENDOSCOPY;  Service: Gastroenterology;  Laterality: N/A;   FEMORAL-POPLITEAL BYPASS GRAFT Right 09/10/2016   Procedure: BYPASS GRAFT FEMORAL-POPLITEAL ARTERY ( BELOW KNEE );  Surgeon: Katha Cabal, MD;  Location: [facility] ORS;  Service: Vascular;  Laterality: Right;   FEMORAL-TIBIAL BYPASS GRAFT Right 03/23/2020   Procedure: BYPASS GRAFT RIGHT FEMORAL- DISTAL TIBIAL ARTERY WITH DISTAL FLOW GRAFT;  Surgeon: Katha Cabal, MD;  Location: [facility] ORS;  Service: Vascular;  Laterality: Right;   LEFT HEART CATH AND CORONARY ANGIOGRAPHY Left 05/16/2021   Procedure: LEFT HEART CATH AND CORONARY ANGIOGRAPHY;  Surgeon: Andrez Grime, MD;  Location: [place] CV LAB;  Service: Cardiovascular;  Laterality: Left;   LOWER EXTREMITY ANGIOGRAPHY Right 11/18/2016   Procedure: Lower  Extremity Angiography;  Surgeon: Katha Cabal, MD;  Location: [place] [facility];  Service: Cardiovascular;  Laterality: Right;   LOWER EXTREMITY ANGIOGRAPHY Right 12/09/2016   Procedure: Lower Extremity Angiography;  Surgeon: Katha Cabal, MD;  Location: [place] [facility];  Service: Cardiovascular;  Laterality: Right;   LOWER EXTREMITY ANGIOGRAPHY Right 11/15/2019   Procedure: LOWER EXTREMITY ANGIOGRAPHY;  Surgeon: Katha Cabal, MD;  Location: [place] [facility];  Service: Cardiovascular;  Laterality: Right;   LOWER EXTREMITY ANGIOGRAPHY Right 12/01/2019   Procedure: Lower Extremity Angiography;  Surgeon: Algernon Huxley, MD;  Location: [place] [facility] LAB;  Service: Cardiovascular;  Laterality: Right;   LOWER EXTREMITY ANGIOGRAPHY Right 12/02/2019   Procedure: LOWER EXTREMITY ANGIOGRAPHY;  Surgeon: Katha Cabal, MD;  Location: [place] [facility] LAB;  Service: Cardiovascular;  Laterality: Right;   LOWER EXTREMITY ANGIOGRAPHY Right 03/23/2020   Procedure: Lower Extremity Angiography;  Surgeon: Katha Cabal, MD;  Location: [place] [facility] LAB;  Service: Cardiovascular;  Laterality: Right;   LOWER EXTREMITY ANGIOGRAPHY Right  09/25/2021   Procedure: Lower Extremity Angiography;  Surgeon: Katha Cabal, MD;  Location: [place] [facility];  Service: Cardiovascular;  Laterality: Right;   LOWER EXTREMITY ANGIOGRAPHY Right 09/26/2021   Procedure: Lower Extremity Angiography;  Surgeon: Algernon Huxley, MD;  Location: [place] [facility] LAB;  Service: Cardiovascular;  Laterality: Right;   LOWER EXTREMITY ANGIOGRAPHY Right 04/08/2022   Procedure: Lower Extremity Angiography;  Surgeon: Katha Cabal, MD;  Location: [place] [facility];  Service: Cardiovascular;  Laterality: Right;   LOWER EXTREMITY INTERVENTION  11/18/2016   Procedure: Lower Extremity Intervention;  Surgeon: Katha Cabal, MD;  Location: [place] [facility];  Service: Cardiovascular;;   PERIPHERAL VASCULAR  CATHETERIZATION Right 01/02/2015   Procedure: Lower Extremity Angiography;  Surgeon: Katha Cabal, MD;  Location: [place] [facility];  Service: Cardiovascular;  Laterality: Right;   PERIPHERAL VASCULAR CATHETERIZATION Right 06/18/2015   Procedure: Lower Extremity Angiography;  Surgeon: Algernon Huxley, MD;  Location: [place] [facility];  Service: Cardiovascular;  Laterality: Right;   PERIPHERAL VASCULAR CATHETERIZATION  06/18/2015   Procedure: Lower Extremity Intervention;  Surgeon: Algernon Huxley, MD;  Location: [place] [facility];  Service: Cardiovascular;;   PERIPHERAL VASCULAR CATHETERIZATION N/A 10/09/2015   Procedure: Abdominal Aortogram w/Lower Extremity;  Surgeon: Katha Cabal, MD;  Location: [place] [facility];  Service: Cardiovascular;  Laterality: N/A;   PERIPHERAL VASCULAR CATHETERIZATION  10/09/2015   Procedure: Lower Extremity Intervention;  Surgeon: Katha Cabal, MD;  Location: [place] [facility];  Service: Cardiovascular;;   PERIPHERAL VASCULAR CATHETERIZATION Right 10/10/2015   Procedure: Lower Extremity Angiography;  Surgeon: Algernon Huxley, MD;  Location: [place] [facility];  Service: Cardiovascular;  Laterality: Right;   PERIPHERAL VASCULAR CATHETERIZATION Right 10/10/2015   Procedure: Lower Extremity Intervention;  Surgeon: Algernon Huxley, MD;  Location: [place] [facility];  Service: Cardiovascular;  Laterality: Right;   PERIPHERAL VASCULAR CATHETERIZATION Right 12/03/2015   Procedure: Lower Extremity Angiography;  Surgeon: Algernon Huxley, MD;  Location: [employer] [facility] LAB;  Service: Cardiovascular;  Laterality: Right;   PERIPHERAL VASCULAR CATHETERIZATION Right 12/04/2015   Procedure: Lower Extremity Angiography;  Surgeon: Algernon Huxley, MD;  Location: [place] [facility];  Service: Cardiovascular;  Laterality: Right;   PERIPHERAL VASCULAR CATHETERIZATION  12/04/2015   Procedure: Lower Extremity Intervention;  Surgeon: Algernon Huxley, MD;  Location: [place] [facility];   Service: Cardiovascular;;   PERIPHERAL VASCULAR CATHETERIZATION Right 06/30/2016   Procedure: Lower Extremity Angiography;  Surgeon: Algernon Huxley, MD;  Location: [place] [facility];  Service: Cardiovascular;  Laterality: Right;   PERIPHERAL VASCULAR CATHETERIZATION Right 06/25/2016   Procedure: Lower Extremity Angiography;  Surgeon: Katha Cabal, MD;  Location: [place] [facility];  Service: Cardiovascular;  Laterality: Right;   PERIPHERAL VASCULAR CATHETERIZATION Right 07/01/2016   Procedure: Lower Extremity Angiography;  Surgeon: Katha Cabal, MD;  Location: [place] [facility];  Service: Cardiovascular;  Laterality: Right;   PERIPHERAL VASCULAR CATHETERIZATION  07/01/2016   Procedure: Lower Extremity Intervention;  Surgeon: Katha Cabal, MD;  Location: [place] [facility];  Service: Cardiovascular;;   PERIPHERAL VASCULAR CATHETERIZATION Right 08/01/2016   Procedure: Lower Extremity Angiography;  Surgeon: Katha Cabal, MD;  Location: [place] [facility];  Service: Cardiovascular;  Laterality: Right;   PORTA CATH INSERTION N/A 10/05/2020   Procedure: PORTA CATH INSERTION;  Surgeon: Katha Cabal, MD;  Location: [place] [facility];  Service: Cardiovascular;  Laterality: N/A;   stent placement in right leg Right    VIDEO BRONCHOSCOPY N/A 09/14/2020   Procedure: VIDEO BRONCHOSCOPY WITH FLUORO;  Surgeon: Tyler Pita, MD;  Location: [facility] ORS;  Service: Cardiopulmonary;  Laterality: N/A;   VIDEO BRONCHOSCOPY WITH ENDOBRONCHIAL ULTRASOUND N/A 09/14/2020   Procedure: VIDEO BRONCHOSCOPY WITH ENDOBRONCHIAL ULTRASOUND;  Surgeon: Tyler Pita, MD;  Location: [facility] ORS;  Service: Cardiopulmonary;  Laterality: N/A;    SOCIAL HISTORY: Social History   Socioeconomic History   Marital status: Single    Spouse name: Not on file   Number of children: Not on file   Years of education: Not on file   Highest education level: Not on file  Occupational History    Occupation: unemployed  Tobacco Use   Smoking status: Former    Packs/day: 2.00    Years: 38.00    Total pack years: 76.00    Types: Cigarettes    Quit date: 12/20/2014    Years since quitting: 7.6   Smokeless tobacco: Never   Tobacco comments:    quit smoking in 2017 most smoked 2   Vaping Use   Vaping Use: Former  Substance and Sexual Activity   Alcohol use: Not Currently   Drug use: No   Sexual activity: Yes  Other Topics Concern   Not on file  Social History Narrative   Not on file   Social Determinants of Health   Financial Resource Strain: Not on file  Food Insecurity: Not on file  Transportation Needs: Not on file  Physical Activity: Not on file  Stress: Not on file  Social Connections: Not on file  Intimate Partner Violence: Not on file    FAMILY HISTORY: Family History  Problem Relation Age of Onset   Diabetes Mother    Hypertension Mother    Heart murmur Mother    Leukemia Mother    Throat cancer Maternal Grandmother    Colon cancer Neg Hx    Breast cancer Neg Hx     ALLERGIES:  has No Known Allergies.  MEDICATIONS:  Current Outpatient Medications  Medication Sig Dispense Refill   aspirin EC 81 MG tablet Take 81 mg by mouth daily. Swallow whole.     BEVESPI AEROSPHERE 9-4.8 MCG/ACT AERO Inhale into the lungs.     clopidogrel (PLAVIX) 75 MG tablet TAKE 1 TABLET BY MOUTH  DAILY 100 tablet 2   levothyroxine (SYNTHROID) 100 MCG tablet Take 1 tablet (100 mcg total) by mouth daily before breakfast. 30 tablet 0   simvastatin (ZOCOR) 40 MG tablet Take 40 mg by mouth at bedtime.     No current facility-administered medications for this visit.     PHYSICAL EXAMINATION: ECOG PERFORMANCE STATUS: 1 - Symptomatic but completely ambulatory Vitals:   08/15/22 0858  BP: (!) 151/80  Pulse: 93  Temp: (!) 97.2 F (36.2 C)  SpO2: 100%   Filed Weights   08/15/22 0858  Weight: 220 lb 9.6 oz (100.1 kg)    Physical Exam Constitutional:      General: He is  not in acute distress.    Appearance: He is not diaphoretic.     Comments: Patient walks with a cane  HENT:     Head: Normocephalic and atraumatic.     Nose: Nose normal.     Mouth/Throat:     Pharynx: No oropharyngeal exudate.  Eyes:     General: No scleral icterus.    Pupils: Pupils are equal, round, and reactive to light.  Cardiovascular:     Rate and Rhythm: Normal rate and regular rhythm.     Heart sounds: No murmur heard. Pulmonary:     Effort: Pulmonary effort is normal. No respiratory distress.     Breath sounds: No rales.  Chest:     Chest wall: No tenderness.  Abdominal:     General: There is no distension.     Palpations: Abdomen is soft.     Tenderness: There is no abdominal tenderness.  Musculoskeletal:     Cervical back: Normal range of motion and neck supple.  Skin:    General: Skin is warm and dry.     Findings: No erythema.  Neurological:     Mental Status: He is alert and oriented to person, place, and time.     Cranial Nerves: No cranial nerve deficit.     Motor: No abnormal muscle tone.     Coordination: Coordination normal.  Psychiatric:        Mood and Affect: Affect normal.      LABORATORY DATA:  I have reviewed the data as listed    Latest Ref Rng & Units 08/15/2022    8:47 AM 08/01/2022    8:49 AM 07/18/2022   10:00 AM  CBC  WBC 4.0 - 10.5 K/uL 4.6  6.7  8.0   Hemoglobin 13.0 - 17.0 g/dL 12.6  13.2  13.2   Hematocrit 39.0 - 52.0 % 38.5  40.1  40.6   Platelets 150 - 400 K/uL 163  182  166       Latest Ref Rng & Units 08/15/2022    8:47 AM 08/01/2022    8:49 AM 07/18/2022   10:00 AM  CMP  Glucose 70 - 99 mg/dL 106  111  100   BUN 8 - 23 mg/dL 21  26  27    Creatinine 0.61 - 1.24 mg/dL 1.03  1.15  1.08   Sodium 135 - 145 mmol/L 140  139  138   Potassium 3.5 - 5.1 mmol/L 3.8  3.6  3.9   Chloride 98 - 111 mmol/L 105  105  102   CO2 22 - 32 mmol/L 25  25  28    Calcium 8.9 - 10.3 mg/dL 9.2  8.6  9.0   Total Protein 6.5 - 8.1 g/dL 6.7  6.4   6.7   Total Bilirubin 0.3 - 1.2 mg/dL 0.3  0.2  0.5   Alkaline Phos 38 - 126 U/L 54  52  50   AST 15 - 41 U/L 27  29  24    ALT 0 - 44 U/L 38  50  49      RADIOGRAPHIC STUDIES: I have personally reviewed the radiological images as listed and agreed with the findings in the report. MR BRAIN W WO CONTRAST  Result Date: 07/22/2022 CLINICAL DATA:  Lung carcinoma, treated metastatic disease. Stereotactic radio surgery. EXAM: MRI HEAD WITHOUT AND WITH CONTRAST TECHNIQUE: Multiplanar, multiecho pulse sequences of the brain and surrounding structures were obtained without and with intravenous contrast. CONTRAST:  23mL GADAVIST GADOBUTROL 1 MMOL/ML IV SOLN COMPARISON:  05/23/2022 brain MRI FINDINGS: Brain: No acute infarct or acute hemorrhage. No hydrocephalus or midline shift. There is multifocal hyperintense T2-weighted signal within the periventricular and deep white matter. There are no new or larger lesions. There are 4 lesions that are unchanged or smaller: 1. Left cerebellum, 2 mm, unchanged.  Series 1,023 image 162 2. Right occipital lobe, 8 mm, unchanged, image 137 3. Left frontoparietal junction,  20 mm pelvis slightly smaller. Markedly decreased contrast enhancement and decreased surrounding hyperintense T2-weighted signal. Image 93 4. Left postcentral gyrus, 7 mm, smaller, image 52 Vascular: Normal flow voids. Skull and upper cervical spine: Normal bone marrow signal. Sinuses/Orbits: Normal orbits. Paranasal sinuses and mastoids are normal. Other: None IMPRESSION: 1. No new or enlarging lesions. No intracranial disease progression. 2. Decreased size of treated lesions of the left frontoparietal junction and left postcentral gyrus. 3. Unchanged lesions in the left cerebellum and right occipital lobe. Electronically Signed   By: Ulyses Jarred M.D.   On: 07/22/2022 23:04   NM Bone Scan Whole Body  Result Date: 06/10/2022 CLINICAL DATA:  Squamous cell carcinoma of the lung EXAM: NUCLEAR MEDICINE WHOLE  BODY BONE SCAN TECHNIQUE: Whole body anterior and posterior images were obtained approximately 3 hours after intravenous injection of radiopharmaceutical. RADIOPHARMACEUTICALS:  20.51 mCi Technetium-24m MDP IV COMPARISON:  None Available Radiographic correlation: CT chest abdomen pelvis 05/28/2022 FINDINGS: Significant uptake at LEFT elbow corresponding to known fracture. Due to limitations of elbow positioning, LEFT hand projects over LEFT iliac wing on anterior view. Uptake at adjacent posterior LEFT tenth eleventh twelfth ribs, consistent with healing fractures. Uptake at costovertebral junction LEFT seventh rib corresponding to degenerative changes on CT. No worrisome sites of tracer uptake to suggest osseous metastatic disease. Portions of pelvis obscured by retained tracer within urinary bladder. Expected urinary tract and soft tissue distribution of tracer otherwise identified. IMPRESSION: Degenerative and posttraumatic type uptake as above. No scintigraphic evidence of osseous metastatic disease. Electronically Signed   By: Lavonia Dana M.D.   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 Follow-up study. * Tracking Code: BO * EXAM: CT CHEST, ABDOMEN, AND PELVIS WITH CONTRAST TECHNIQUE: Multidetector CT imaging of the chest, abdomen and pelvis was performed following the standard protocol during bolus administration of intravenous contrast. RADIATION DOSE REDUCTION: This exam was performed according to the departmental dose-optimization program which includes automated exposure control, adjustment of the mA and/or kV according to patient size and/or use of iterative reconstruction technique. CONTRAST:  121mL OMNIPAQUE IOHEXOL 300 MG/ML  SOLN COMPARISON:  Chest CT 04/04/2022. CT of the chest, abdomen and pelvis  01/17/2022. FINDINGS: CT CHEST FINDINGS Cardiovascular: Heart size is normal. There is no significant pericardial fluid, thickening or pericardial calcification. There is aortic atherosclerosis, as well as atherosclerosis of the great vessels of the mediastinum and the coronary arteries, including calcified atherosclerotic plaque in the left anterior descending and right coronary arteries. Mediastinum/Nodes: No pathologically enlarged mediastinal or hilar lymph nodes. Esophagus is unremarkable in appearance. No axillary lymphadenopathy. Lungs/Pleura: Chronic mass-like architectural distortion and volume loss is noted in the perihilar aspect of the right mid to upper lung, similar to prior examinations, most compatible with areas of chronic postradiation mass-like fibrosis. The surrounding regions of ground-glass attenuation and septal thickening seen on prior examinations persist, but have decreased compared to the prior study, most likely to reflect resolving postradiation pneumonitis. Trace right pleural effusion lying dependently, similar to the prior study. No left pleural effusion. No new acute consolidative airspace disease. No definite suspicious appearing pulmonary nodules or masses are noted. Musculoskeletal: Orthopedic fixation hardware in the lower cervical spine incompletely imaged. There are no aggressive appearing lytic or blastic lesions noted in the visualized portions of the skeleton. CT ABDOMEN PELVIS FINDINGS Hepatobiliary:  Diffuse low attenuation throughout the hepatic parenchyma, indicative of a background of hepatic steatosis. No suspicious cystic or solid hepatic lesions. No intra or extrahepatic biliary ductal dilatation. Gallbladder is normal in appearance. Pancreas: No pancreatic mass. No pancreatic ductal dilatation. No pancreatic or peripancreatic fluid collections or inflammatory changes. Spleen: Unremarkable. Adrenals/Urinary Tract: Bilateral kidneys and bilateral adrenal glands are  normal in appearance. No hydroureteronephrosis. Urinary bladder is unremarkable in appearance. Stomach/Bowel: The appearance of the stomach is normal. No pathologic dilatation of small bowel or colon. Numerous colonic diverticuli are noted, particularly in the sigmoid colon, without surrounding inflammatory changes to suggest an acute diverticulitis at this time. Normal appendix. Vascular/Lymphatic: Atherosclerosis throughout the abdominal aorta and pelvic vasculature. Postoperative changes in the upper right thigh incompletely imaged, with what appears to be a vascular graft bifurcating in the anterior aspect of the upper right thigh, with both limbs of the graft apparently occluded on today's examination. Reproductive: Prostate gland and seminal vesicles are unremarkable in appearance. Other: No significant volume of ascites.  No pneumoperitoneum. Musculoskeletal: Status post PLIF at L5-S1. There are no aggressive appearing lytic or blastic lesions noted in the visualized portions of the skeleton. IMPRESSION: 1. 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 There is a stable small right pleural effusion, but no definitive findings to suggest residual or recurrent disease on today's examination. 2. No signs of metastatic disease in the abdomen or pelvis. 3. Aortic atherosclerosis, in addition to two-vessel coronary artery disease. Please note that although the presence of coronary artery calcium documents the presence of coronary artery disease, the severity of this disease and any potential stenosis cannot be assessed on this non-gated CT examination. Assessment for potential risk factor modification, dietary therapy or pharmacologic therapy may be warranted, if clinically indicated. 4. Hepatic steatosis. 5. Colonic diverticulosis without evidence of acute diverticulitis at this time. 6.  Incompletely imaged but apparently occluded vascular graft in the superior aspect of the right anterior thigh. Referral to vascular specialist for further clinical evaluation should be considered if clinically appropriate. 7. Additional incidental findings, as above. Electronically Signed   By: Vinnie Langton M.D.   On: 05/30/2022 08:04   MR BRAIN W WO CONTRAST  Result Date: 05/27/2022 CLINICAL DATA:  67 year old male with metastatic lung cancer. Three known brain metastases. Status post brain radiation, reportedly SRS. Restaging. EXAM: MRI HEAD WITHOUT AND WITH CONTRAST TECHNIQUE: Multiplanar, multiecho pulse sequences of the brain and surrounding structures were obtained without and with intravenous contrast. CONTRAST:  42mL GADAVIST GADOBUTROL 1 MMOL/ML IV SOLN COMPARISON:  Brain MRI 02/19/2022 and earlier. FINDINGS: Brain: Larger rim enhancing metastasis of the left parietal lobe since August, now 10 mm diameter (3 mm previously. See series 18, image 144. Regional FLAIR hyperintensity is mild but also progressed. Dominant left frontal operculum metastasis has also increased in size and heterogeneously enhancing on series 18, image 104 up to 21 mm diameter now (previously 6 mm). Significant progression of confluent surrounding T2 and FLAIR hyperintensity compatible with vasogenic edema. Much of the enhancing mass is seen to be T2 hypointense on series 10, image 17 and there are some diffusion signal changes. Only minor regional mass effect including on the left lateral ventricle. Progression also of the small right occipital lobe white matter metastasis now 10 mm diameter (previously 4 mm) and increased, moderate regional T2 and FLAIR hyperintense vasogenic edema (series 15, image 21). No significant regional mass effect. New  small 4-5 mm solid enhancing left anterior cerebellar metastasis on series 18, image 34. Mild regional T2 and FLAIR hyperintense edema without mass effect (series 15, image 10). This  lesion might have been punctate on 10/09/2246, uncertain. No other enhancing metastasis identified. No superimposed restricted diffusion suggestive of acute infarction. No midline shift, ventriculomegaly, extra-axial collection or acute intracranial hemorrhage. Cervicomedullary junction and pituitary are within normal limits. Widely scattered and patchy additional bilateral cerebral white matter nonspecific T2 and FLAIR hyperintensity appears stable since May. Vascular: Major intracranial vascular flow voids are stable. Dominant distal left vertebral artery. Major dural venous sinuses are enhancing and appear to be patent. Skull and upper cervical spine: Visualized bone marrow signal is within normal limits. Negative visible cervical spine. Sinuses/Orbits: Stable and negative. Other: Visible internal auditory structures appear normal. Negative visible scalp and face. IMPRESSION: 1. All three known brain metastases have enlarged since August, with increasing regional edema. And there is a small new left anterior cerebellar 4-5 mm metastasis (was questionably punctate on the May MRI). This constellation could reflect a combination of True Progression (Left Cerebellar lesion) and pseudoprogression/radionecrosis (3 existing mets). 2. No significant intracranial mass effect. 3. Underlying moderate cerebral white matter signal changes. Electronically Signed   By: Genevie Ann M.D.   On: 05/27/2022 05:45   CT ELBOW LEFT WO CONTRAST  Result Date: 05/21/2022 CLINICAL DATA:  Fall, follow-up left elbow x-rays EXAM: CT OF THE UPPER LEFT EXTREMITY WITHOUT CONTRAST TECHNIQUE: Multidetector CT imaging of the upper left extremity was performed according to the standard protocol. RADIATION DOSE REDUCTION: This exam was performed according to the departmental dose-optimization program which includes automated exposure control, adjustment of the mA and/or kV according to patient size and/or use of iterative reconstruction technique.  COMPARISON:  Left elbow radiograph 05/14/2022 FINDINGS: Bones/Joint/Cartilage There is a minimally displaced supracondylar fracture of the humerus with associated moderate-sized joint effusion. There is up to 4 mm cortical offset at the medial margin. No condylar split component. Ligaments Suboptimally assessed by CT. Muscles and Tendons No acute myotendinous abnormality or intramuscular collection on noncontrast CT. Soft tissues There is soft tissue swelling along the elbow. IMPRESSION: Minimally displaced supracondylar fracture of the distal humerus. Moderate-sized joint effusion. Electronically Signed   By: Maurine Simmering M.D.   On: 05/21/2022 13:26

## 2022-08-15 NOTE — Addendum Note (Signed)
Addended by: [name] on: 08/15/2022 05:26 PM   Modules accepted: Orders

## 2022-08-15 NOTE — Assessment & Plan Note (Addendum)
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  Repeat CT scan showed improvement of patchy ground-glass opacities.  Reviewed and discussed with patient CT chest abdomen pelvis showed no progression. Labs reviewed and discussed with patient. Proceed with Durvalumab Repeat CT

## 2022-08-15 NOTE — Assessment & Plan Note (Addendum)
Status post radiation,  02/19/22 MRI showed decreased brain lesion size and edema.  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   The small left anterior cerebellar lesion is stable. I have communicated with Dr.Chrystal, he recommends no radiation of left cerebellar lesion. Continue observation.

## 2022-08-18 ENCOUNTER — Other Ambulatory Visit: Payer: Self-pay | Admitting: Oncology

## 2022-08-18 NOTE — Telephone Encounter (Signed)
TSH Order: [id number] Status: Final result     Visible to patient: Yes (seen)     Next appt: 08/25/2022 at 02:30 PM in Radiology ([facility])     Dx: Squamous cell carcinoma of right lung...   0 Result Notes     1 Patient Communication           Component Ref Range & Units 3 d ago (08/15/22) 1 mo ago (07/04/22) 1 mo ago (06/20/22) 2 mo ago (06/06/22) 3 mo ago (05/09/22) 3 mo ago (04/25/22) 4 mo ago (04/07/22)  TSH 0.350 - 4.500 uIU/mL 52.807 High  19.367 High  CM 8.041 High  CM 71.000 High  CM 32.916 High  CM 42.267 High  CM 34.456 High  CM  Comment: Performed by a 3rd Generation assay with a functional sensitivity of <=0.01 uIU/mL. Performed at [facility], [address] [place], [place] [address]  Resulting Agency  [facility] CLIN LAB [place] CL[facility] [place] [facility] [place] [facility] [place] [facility] [place] [facility] CLIN LAB         Specimen Collected: 08/15/22 08:47 Last Resulted: 08/15/22 11:01      T4, free Order: [id number] Status: Final result     Visible to patient: Yes (seen)     Next appt: 08/25/2022 at 02:30 PM in Radiology ([facility])     Dx: Squamous cell carcinoma of right lung...   0 Result Notes     1 Patient Communication           Component Ref Range & Units 3 d ago (08/15/22) 1 mo ago (06/20/22) 4 mo ago (04/07/22) 5 mo ago (03/05/22) 6 mo ago (02/12/22) 6 mo ago (01/24/22) 8 mo ago (12/13/21)  Free T4 0.61 - 1.12 ng/dL 0.77 0.77 CM 1.00 CM 0.74 CM 0.69 CM 0.92 CM 0.93 CM  Comment: (NOTE) Biotin ingestion may interfere with free T4 tests. If the results are inconsistent with the TSH level, previous test results, or the clinical presentation, then consider biotin interference. If needed, order repeat testing after stopping biotin. Performed at [facility], [address] [place], [place] [address]  Resulting Agency  [facility] CLIN [place] [facility] CLIN LAB [facility] CLIN LAB [place] CLIN LAB [place] [facility]IN LAB [place] CLIN LAB [facility] LAB         Specimen Collected: 08/15/22 08:47 Last Resulted:  08/15/22 11:01

## 2022-08-25 ENCOUNTER — Ambulatory Visit
Admission: RE | Admit: 2022-08-25 | Discharge: 2022-08-25 | Disposition: A | Discharge status: Home / Self Care | Payer: Medicare Other | Source: Ambulatory Visit | Attending: Oncology | Admitting: Oncology

## 2022-08-25 DIAGNOSIS — C3491 Malignant neoplasm of unspecified part of right bronchus or lung: Secondary | ICD-10-CM | POA: Diagnosis not present

## 2022-08-25 MED ORDER — IOHEXOL 300 MG/ML  SOLN
100.0000 mL | Freq: Once | INTRAMUSCULAR | Status: AC | PRN
  Administered 2022-08-25: 100 mL via INTRAVENOUS

## 2022-08-27 ENCOUNTER — Other Ambulatory Visit (INDEPENDENT_AMBULATORY_CARE_PROVIDER_SITE_OTHER): Payer: Self-pay | Admitting: Nurse Practitioner

## 2022-08-29 ENCOUNTER — Inpatient Hospital Stay: Payer: Medicare Other

## 2022-08-29 ENCOUNTER — Inpatient Hospital Stay (HOSPITAL_BASED_OUTPATIENT_CLINIC_OR_DEPARTMENT_OTHER): Payer: Medicare Other | Admitting: Oncology

## 2022-08-29 ENCOUNTER — Inpatient Hospital Stay: Payer: Medicare Other | Attending: Oncology

## 2022-08-29 ENCOUNTER — Encounter: Payer: Self-pay | Admitting: Oncology

## 2022-08-29 VITALS — BP 152/88 | HR 106 | Temp 96.7°F | Resp 18 | Wt 220.7 lb

## 2022-08-29 DIAGNOSIS — E032 Hypothyroidism due to medicaments and other exogenous substances: Secondary | ICD-10-CM

## 2022-08-29 DIAGNOSIS — C3491 Malignant neoplasm of unspecified part of right bronchus or lung: Secondary | ICD-10-CM

## 2022-08-29 DIAGNOSIS — C3411 Malignant neoplasm of upper lobe, right bronchus or lung: Secondary | ICD-10-CM | POA: Diagnosis present

## 2022-08-29 DIAGNOSIS — C7931 Secondary malignant neoplasm of brain: Secondary | ICD-10-CM

## 2022-08-29 LAB — COMPREHENSIVE METABOLIC PANEL
ALT: 26 U/L (ref 0–44)
AST: 29 U/L (ref 15–41)
Albumin: 3.5 g/dL (ref 3.5–5.0)
Alkaline Phosphatase: 72 U/L (ref 38–126)
Anion gap: 11 (ref 5–15)
BUN: 15 mg/dL (ref 8–23)
CO2: 25 mmol/L (ref 22–32)
Calcium: 8.9 mg/dL (ref 8.9–10.3)
Chloride: 102 mmol/L (ref 98–111)
Creatinine, Ser: 1.13 mg/dL (ref 0.61–1.24)
GFR, Estimated: >60 mL/min (ref >60)
Glucose, Bld: 196 mg/dL — ABNORMAL HIGH (ref 70–99)
Potassium: 3.6 mmol/L (ref 3.5–5.1)
Sodium: 138 mmol/L (ref 135–145)
Total Bilirubin: 0.4 mg/dL (ref 0.3–1.2)
Total Protein: 6.9 g/dL (ref 6.5–8.1)

## 2022-08-29 LAB — TSH: TSH: 48.673 u[IU]/mL — ABNORMAL HIGH (ref 0.350–4.500)

## 2022-08-29 LAB — CBC WITH DIFFERENTIAL/PLATELET
Abs Immature Granulocytes: 0.02 10*3/uL (ref 0.00–0.07)
Basophils Absolute: 0 10*3/uL (ref 0.0–0.1)
Basophils Relative: 1 %
Eosinophils Absolute: 0.1 10*3/uL (ref 0.0–0.5)
Eosinophils Relative: 2 %
HCT: 38.3 % — ABNORMAL LOW (ref 39.0–52.0)
Hemoglobin: 12.7 g/dL — ABNORMAL LOW (ref 13.0–17.0)
Immature Granulocytes: 1 %
Lymphocytes Relative: 15 %
Lymphs Abs: 0.6 10*3/uL — ABNORMAL LOW (ref 0.7–4.0)
MCH: 27.9 pg (ref 26.0–34.0)
MCHC: 33.2 g/dL (ref 30.0–36.0)
MCV: 84.2 fL (ref 80.0–100.0)
Monocytes Absolute: 0.5 10*3/uL (ref 0.1–1.0)
Monocytes Relative: 12 %
Neutro Abs: 2.7 10*3/uL (ref 1.7–7.7)
Neutrophils Relative %: 69 %
Platelets: 224 10*3/uL (ref 150–400)
RBC: 4.55 MIL/uL (ref 4.22–5.81)
RDW: 17.4 % — ABNORMAL HIGH (ref 11.5–15.5)
WBC: 3.9 10*3/uL — ABNORMAL LOW (ref 4.0–10.5)
nRBC: 0 % (ref 0.0–0.2)

## 2022-08-29 MED ORDER — HEPARIN SOD (PORK) LOCK FLUSH 100 UNIT/ML IV SOLN
500.0000 [IU] | Freq: Once | INTRAVENOUS | Status: AC
  Administered 2022-08-29: 500 [IU] via INTRAVENOUS
  Filled 2022-08-29: qty 5

## 2022-08-29 MED ORDER — SODIUM CHLORIDE 0.9% FLUSH
10.0000 mL | INTRAVENOUS | Status: DC | PRN
  Administered 2022-08-29: 10 mL via INTRAVENOUS
  Filled 2022-08-29: qty 10

## 2022-08-29 NOTE — Assessment & Plan Note (Signed)
Status post radiation,  02/19/22 MRI showed decreased brain lesion size and edema.  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   The small left anterior cerebellar lesion is stable. I have communicated with Dr.Chrystal, he recommends no radiation of left cerebellar lesion. Continue observation.

## 2022-08-29 NOTE — Progress Notes (Signed)
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  Repeat CT scan showed improvement of patchy ground-glass opacities.  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  Labs reviewed and discussed with patient. Hold  Durvalumab Discussed with pulmonology Dr.Gonzalez. will check connective tissue panel, monoclonal gammopathy panel and HIV, he has no breathing difficulties, has upcoming appt with pulmonology  Hypothyroidism due to medication Synthroid 100 mcg daily   Malignant neoplasm metastatic to brain [facility]) Status post radiation,  02/19/22 MRI showed decreased brain lesion size and edema.  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   The small left anterior cerebellar lesion is stable. I have communicated with Dr.Chrystal, he recommends no radiation of left cerebellar lesion. Continue observation.      No orders of the defined types were placed in this encounter.  Follow up  in Early March 2024 All questions were answered. The patient knows to call the clinic with any problems, questions or concerns.  [name], MD, PhD [facility] Hematology Oncology 08/29/2022       HISTORY OF PRESENTING ILLNESS:  Joshua [name] is a 66 y.o. male who has  above history reviewed by me today presents for follow up visit for Stage IV lung squamous cell carcinoma. 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  Hypermetabolic cluster right paratracheal adenopathy with maximum SUV up to 12.  No findings of metastatic disease to the neck/abdomen/pelvis or skeleton.   09/20/2020 Initial Diagnosis   Squamous cell carcinoma of right lung (HCC) NGS showed PD-L1 TPS 95%, LRP1B S910fs, PIK3CA E545K, RB1 c1422-2A>T, RIT1 T83_ A84del, STK11 P246fs, TPS53 R248W, TMB 19.78mut/mb, MS stable.    09/20/2020 Cancer Staging   Staging form: Lung, AJCC 8th Edition - Clinical stage from 09/20/2020: Stage IV (cT1b, cN2, cM1) - Signed by [name], MD on 12/13/2021   10/03/2020 Imaging   MRI brain with and without contrast showed no evidence of intracranial metastatic disease. Well-defined round lesion within the left parotid tail measuring approximately 2.3 x 1.8 x 1.8 cm   10/09/2020 - 11/27/2020 Radiation Therapy   concurrent chemoradiation   10/10/2020 - 10/31/2020 Chemotherapy   10/10/2020 concurrent chemoradiation.  carboplatin AUC of 2 and Taxol 45 mg/m2 x 4.  Additional chemotherapy was held due to thrombocytopenia, neutropenia       12/31/2020 - 03/21/2022 Chemotherapy   Maintenance  Durvalumab q14d      12/31/2020 -  Chemotherapy   Patient is on Treatment Plan : LUNG Durvalumab (10) q14d     03/28/2021 Imaging   CT without contrast showed new linear right perihilar consolidation with associated traction bronchiectasis.  Likely postradiation changes.  Additional new peribronchial vascular/nodular groundglass opacities with areas of consolidation or seen primarily in the right  upper and lower lobes, more distant from expected radiation fields. 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  Per my discussion with pulmonology Dr. 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     05/09/2021 Imaging   Repeat CT chest without contrast showed evolving radiation changes in the right perihilar region likely accounting for interval increase to thickening of the posterior wall of the right upper lobe bronchus.  The more peripheral right lung groundglass opacities seen on most recent study have generally improved with exception of one peripherally right upper lobe which appears new.  These are likely inflammatory.  No findings highly suspicious for local recurrence.   05/30/2021 - 01/10/2022 Chemotherapy   Durvalumab q14d     10/07/2021 Imaging   MRI brain with and without contrast showed 2 contrast-enhancing lesions, most consistent with metastatic disease.  Largest lesion is in the left frontal lobe and measures up to 1.3 cm with a large amount of surrounding edema and 9 mm rightward midline shift.  The other enhancing lesion was 4 mm in the right occipital lobe with a small amount of surrounding edema.  No acute or chronic hemorrhage.   10/07/2021 Progression   Headache after colonoscopy, progressive difficulty using her right arm.  Progressively worsening of speech. Stage IV lung cancer with brain metastasis.  started on Dexamethasone.  He was seen by neurosurgery and radiation oncology.  Recommendation is to proceed with SRS.  Patient was discharged with dexamethasone   10/16/2021 Imaging   PET scan showed postradiation changes in the right supra hilar lung without evidence of local recurrence.  No evidence of lung cancer recurrence or metastatic disease.  Single focus of intense metabolic activity associated with posterior left 11th rib likely secondary to recent fall/posttraumatic metabolic changes.  Frontal  uptake in the left frontal lobe corresponding to the known metastatic lesion in the brain   10/29/2021 -  Radiation Therapy   Brain Radiation  [facility]   12/10/2021 Imaging   Brain MRI: Continued interval decrease in size of a left frontal lobe metastasis, now measuring 10 mm. Moderate surrounding edema has also decreased.  3-4 mm metastasis within the right occipital lobe, not significantly changed in size. There is now a small focus of central non enhancement within this lesion. Increased edema. 3 mm enhancing metastasis within the high left parietal lobe. In retrospect, this was likely present as a subtle punctate focus of enhancement on the prior brain MRI of 10/22/2021.   01/18/2022 Imaging   CT chest abdomen pelvis w contrast 1. Stable post treatment changes in the right upper lobe medially with dense radiation fibrosis but no findings to suggest local recurrent tumor. 2. Significant progression of ill-defined ground-glass opacity, interstitial thickening and airspace nodularity in the right upper lobe and upper aspect of the right lower lobe. Findings could reflect an inflammatory or atypical infectious process, drug induced pneumonitis or interstitial spread of tumor.3. No mediastinal or hilar mass or adenopathy. 4. No findings for abdominal/pelvic metastatic disease or osseous metastatic disease.   02/19/2022 Imaging   MRI brain w wo contrast showed Three metastatic deposits in the brain appear smaller. Decreased edema in the left parietal lobe. No new lesions    04/04/2022 Imaging   CT chest w contrast 1. Stable radiation changes in the right perihilar region without evidence of local recurrence. 2. Interval improvement in the patchy ground-glass opacities previously demonstrated in  both lungs, most consistent with a resolving infectious or inflammatory process (including immunotherapy-related pneumonitis). 3. No evidence of metastatic disease.   05/14/2022 Miscellaneous   He had a mechanical fall and fractured his left elbow. xrays showed a left elbow fracture  05/26/22 seen by orthopedic surgeon, cast applied.    05/27/2022  Imaging   MRI brain w wo contrast 1. All three known brain metastases have enlarged since August, with increasing regional edema. And there is a small new left anterior cerebellar 4-5 mm metastasis (was questionably punctate on the May MRI). This constellation could reflect a combination of True Progression (Left Cerebellar lesion) and pseudoprogression/radionecrosis (3 existing mets).    2. No significant intracranial mass effect. 3. Underlying moderate cerebral white matter signal changes.   05/30/2022 Imaging   CT chest abdomen pelvis w contrast  1. 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 There is a stable small right pleural effusion, but no definitive findings to suggest residual or recurrent disease on today's examination. 2. No signs of metastatic disease in the abdomen or pelvis.  3. Aortic atherosclerosis, in addition to two-vessel coronary artery disease. Please note that although the presence of coronary artery calcium documents the presence of coronary artery disease, the severity of this disease and any potential stenosis cannot be assessed on this non-gated CT examination. Assessment for potential risk factor modification, dietary therapy or pharmacologic therapy may be warranted, if clinically indicated. 4. Hepatic steatosis. 5. Colonic diverticulosis without evidence of acute diverticulitis at this time. 6. Incompletely imaged but apparently occluded vascular graft in the superior aspect of the right anterior thigh. Referral to vascular specialist for further clinical evaluation should be considered if clinically appropriate. 7. Additional incidental findings, as above.   07/22/2022 Imaging   MRI brain  1. No new or enlarging lesions. No intracranial disease progression. 2. Decreased size of treated lesions of the left frontoparietal junction and left  postcentral gyrus. 3. Unchanged lesions in the left cerebellum and right occipital lobe.    Today he feels well.Intermittent headache, is better. denies any focal deficits. chronic SOB,not worse.  no chest pain wheezing, cough.  Finished Dexamethasone tapering course.    Review of Systems  Constitutional:  Positive for fatigue. Negative for appetite change, chills, fever and unexpected weight change.  HENT:   Negative for hearing loss and voice change.   Eyes:  Negative for eye problems and icterus.  Respiratory:  Negative for chest tightness, cough and shortness of breath.   Cardiovascular:  Negative for chest pain and leg swelling.  Gastrointestinal:  Negative for abdominal distention and abdominal pain.  Endocrine: Negative for hot flashes.  Genitourinary:  Negative for difficulty urinating, dysuria and frequency.   Musculoskeletal:  Negative for arthralgias.  Skin:  Negative for itching and rash.  Neurological:  Positive for extremity weakness. Negative for headaches, light-headedness, numbness and speech difficulty.  Hematological:  Negative for adenopathy. Does not bruise/bleed easily.  Psychiatric/Behavioral:  Negative for confusion.       MEDICAL HISTORY:  Past Medical History:  Diagnosis Date   Allergy    Cancer ([place])    lung   COPD (chronic obstructive pulmonary disease) (HCC)    Dyspnea    former smoker. only doe   History of kidney stones    Hypertension    Peripheral vascular disease ([place])    Pre-diabetes    Sleep apnea     SURGICAL HISTORY: Past  Surgical History:  Procedure Laterality Date   ANTERIOR CERVICAL DECOMP/DISCECTOMY FUSION N/A 03/14/2020   Procedure: ANTERIOR CERVICAL DECOMPRESSION/DISCECTOMY FUSION 3 LEVELS C4-7;  Surgeon: Meade Maw, MD;  Location: [facility] ORS;  Service: Neurosurgery;  Laterality: N/A;   BACK SURGERY  2011   CENTRAL LINE INSERTION Right 09/10/2016   Procedure: CENTRAL LINE INSERTION;  Surgeon: Katha Cabal, MD;   Location: [facility] ORS;  Service: Vascular;  Laterality: Right;   CERVICAL FUSION     C4-c7 on Mar 14, 2020   COLONOSCOPY  2013 ?   COLONOSCOPY N/A 09/09/2021   Procedure: COLONOSCOPY;  Surgeon: [name], DO;  Location: [facility] ENDOSCOPY;  Service: Gastroenterology;  Laterality: N/A;   CORONARY ANGIOPLASTY     ESOPHAGOGASTRODUODENOSCOPY N/A 09/09/2021   Procedure: ESOPHAGOGASTRODUODENOSCOPY (EGD);  Surgeon: [name], DO;  Location: [facility] ENDOSCOPY;  Service: Gastroenterology;  Laterality: N/A;   FEMORAL-POPLITEAL BYPASS GRAFT Right 09/10/2016   Procedure: BYPASS GRAFT FEMORAL-POPLITEAL ARTERY ( BELOW KNEE );  Surgeon: Katha Cabal, MD;  Location: [facility] ORS;  Service: Vascular;  Laterality: Right;   FEMORAL-TIBIAL BYPASS GRAFT Right 03/23/2020   Procedure: BYPASS GRAFT RIGHT FEMORAL- DISTAL TIBIAL ARTERY WITH DISTAL FLOW GRAFT;  Surgeon: Katha Cabal, MD;  Location: [facility] ORS;  Service: Vascular;  Laterality: Right;   LEFT HEART CATH AND CORONARY ANGIOGRAPHY Left 05/16/2021   Procedure: LEFT HEART CATH AND CORONARY ANGIOGRAPHY;  Surgeon: Andrez Grime, MD;  Location: [place] CV LAB;  Service: Cardiovascular;  Laterality: Left;   LOWER EXTREMITY ANGIOGRAPHY Right 11/18/2016   Procedure: Lower Extremity Angiography;  Surgeon: Katha Cabal, MD;  Location: [place] CV LAB;  Service: Cardiovascular;  Laterality: Right;   LOWER EXTREMITY ANGIOGRAPHY Right 12/09/2016   Procedure: Lower Extremity Angiography;  Surgeon: Katha Cabal, MD;  Location: [place] CV LAB;  Service: Cardiovascular;  Laterality: Right;   LOWER EXTREMITY ANGIOGRAPHY Right 11/15/2019   Procedure: LOWER EXTREMITY ANGIOGRAPHY;  Surgeon: Katha Cabal, MD;  Location: [place] CV LAB;  Service: Cardiovascular;  Laterality: Right;   LOWER EXTREMITY ANGIOGRAPHY Right 12/01/2019   Procedure: Lower Extremity Angiography;  Surgeon: Algernon Huxley, MD;  Location: [place] CV  LAB;  Service: Cardiovascular;  Laterality: Right;   LOWER EXTREMITY ANGIOGRAPHY Right 12/02/2019   Procedure: LOWER EXTREMITY ANGIOGRAPHY;  Surgeon: Katha Cabal, MD;  Location: [place] [facility] LAB;  Service: Cardiovascular;  Laterality: Right;   LOWER EXTREMITY ANGIOGRAPHY Right 03/23/2020   Procedure: Lower Extremity Angiography;  Surgeon: Katha Cabal, MD;  Location: [place] [facility] LAB;  Service: Cardiovascular;  Laterality: Right;   LOWER EXTREMITY ANGIOGRAPHY Right 09/25/2021   Procedure: Lower Extremity Angiography;  Surgeon: Katha Cabal, MD;  Location: [place] [facility] LAB;  Service: Cardiovascular;  Laterality: Right;   LOWER EXTREMITY ANGIOGRAPHY Right 09/26/2021   Procedure: Lower Extremity Angiography;  Surgeon: Algernon Huxley, MD;  Location: [place] [facility];  Service: Cardiovascular;  Laterality: Right;   LOWER EXTREMITY ANGIOGRAPHY Right 04/08/2022   Procedure: Lower Extremity Angiography;  Surgeon: Katha Cabal, MD;  Location: [place] CV LAB;  Service: Cardiovascular;  Laterality: Right;   LOWER EXTREMITY INTERVENTION  11/18/2016   Procedure: Lower Extremity Intervention;  Surgeon: Katha Cabal, MD;  Location: [place] CV LAB;  Service: Cardiovascular;;   PERIPHERAL VASCULAR CATHETERIZATION Right 01/02/2015   Procedure: Lower Extremity Angiography;  Surgeon: Katha Cabal, MD;  Location: [place] [facility] LAB;  Service: Cardiovascular;  Laterality: Right;  PERIPHERAL VASCULAR CATHETERIZATION Right 06/18/2015   Procedure: Lower Extremity Angiography;  Surgeon: Algernon Huxley, MD;  Location: [place] [facility] LAB;  Service: Cardiovascular;  Laterality: Right;   PERIPHERAL VASCULAR CATHETERIZATION  06/18/2015   Procedure: Lower Extremity Intervention;  Surgeon: Algernon Huxley, MD;  Location: [place] CV LAB;  Service: Cardiovascular;;   PERIPHERAL VASCULAR CATHETERIZATION N/A 10/09/2015   Procedure: Abdominal Aortogram w/Lower Extremity;  Surgeon:  Katha Cabal, MD;  Location: [place] [facility] LAB;  Service: Cardiovascular;  Laterality: N/A;   PERIPHERAL VASCULAR CATHETERIZATION  10/09/2015   Procedure: Lower Extremity Intervention;  Surgeon: Katha Cabal, MD;  Location: [place] CV LAB;  Service: Cardiovascular;;   PERIPHERAL VASCULAR CATHETERIZATION Right 10/10/2015   Procedure: Lower Extremity Angiography;  Surgeon: Algernon Huxley, MD;  Location: [place] [facility] LAB;  Service: Cardiovascular;  Laterality: Right;   PERIPHERAL VASCULAR CATHETERIZATION Right 10/10/2015   Procedure: Lower Extremity Intervention;  Surgeon: Algernon Huxley, MD;  Location: [place] [facility] LAB;  Service: Cardiovascular;  Laterality: Right;   PERIPHERAL VASCULAR CATHETERIZATION Right 12/03/2015   Procedure: Lower Extremity Angiography;  Surgeon: Algernon Huxley, MD;  Location: [place] [facility] LAB;  Service: Cardiovascular;  Laterality: Right;   PERIPHERAL VASCULAR CATHETERIZATION Right 12/04/2015   Procedure: Lower Extremity Angiography;  Surgeon: Algernon Huxley, MD;  Location: [place] [facility] LAB;  Service: Cardiovascular;  Laterality: Right;   PERIPHERAL VASCULAR CATHETERIZATION  12/04/2015   Procedure: Lower Extremity Intervention;  Surgeon: Algernon Huxley, MD;  Location: [place] [facility];  Service: Cardiovascular;;   PERIPHERAL VASCULAR CATHETERIZATION Right 06/30/2016   Procedure: Lower Extremity Angiography;  Surgeon: Algernon Huxley, MD;  Location: [place] [facility] LAB;  Service: Cardiovascular;  Laterality: Right;   PERIPHERAL VASCULAR CATHETERIZATION Right 06/25/2016   Procedure: Lower Extremity Angiography;  Surgeon: Katha Cabal, MD;  Location: [place] [facility] LAB;  Service: Cardiovascular;  Laterality: Right;   PERIPHERAL VASCULAR CATHETERIZATION Right 07/01/2016   Procedure: Lower Extremity Angiography;  Surgeon: Katha Cabal, MD;  Location: [place] [facility] LAB;  Service: Cardiovascular;  Laterality: Right;   PERIPHERAL VASCULAR CATHETERIZATION   07/01/2016   Procedure: Lower Extremity Intervention;  Surgeon: Katha Cabal, MD;  Location: [place] [facility];  Service: Cardiovascular;;   PERIPHERAL VASCULAR CATHETERIZATION Right 08/01/2016   Procedure: Lower Extremity Angiography;  Surgeon: Katha Cabal, MD;  Location: [place] [facility] LAB;  Service: Cardiovascular;  Laterality: Right;   PORTA CATH INSERTION N/A 10/05/2020   Procedure: PORTA CATH INSERTION;  Surgeon: Katha Cabal, MD;  Location: [place] [facility];  Service: Cardiovascular;  Laterality: N/A;   stent placement in right leg Right    VIDEO BRONCHOSCOPY N/A 09/14/2020   Procedure: VIDEO BRONCHOSCOPY WITH FLUORO;  Surgeon: Tyler Pita, MD;  Location: [facility] ORS;  Service: Cardiopulmonary;  Laterality: N/A;   VIDEO BRONCHOSCOPY WITH ENDOBRONCHIAL ULTRASOUND N/A 09/14/2020   Procedure: VIDEO BRONCHOSCOPY WITH ENDOBRONCHIAL ULTRASOUND;  Surgeon: Tyler Pita, MD;  Location: [facility] ORS;  Service: Cardiopulmonary;  Laterality: N/A;    SOCIAL HISTORY: Social History   Socioeconomic History   Marital status: Single    Spouse name: Not on file   Number of children: Not on file   Years of education: Not on file   Highest education level: Not on file  Occupational History   Occupation: unemployed  Tobacco Use   Smoking status: Former    Packs/day: 2.00    Years: 38.00  Total pack years: 76.00    Types: Cigarettes    Quit date: 12/20/2014    Years since quitting: 7.6   Smokeless tobacco: Never   Tobacco comments:    quit smoking in 2017 most smoked 2   Vaping Use   Vaping Use: Former  Substance and Sexual Activity   Alcohol use: Not Currently   Drug use: No   Sexual activity: Yes  Other Topics Concern   Not on file  Social History Narrative   Not on file   Social Determinants of Health   Financial Resource Strain: Not on file  Food Insecurity: Not on file  Transportation Needs: Not on file  Physical Activity: Not on file  Stress:  Not on file  Social Connections: Not on file  Intimate Partner Violence: Not on file    FAMILY HISTORY: Family History  Problem Relation Age of Onset   Diabetes Mother    Hypertension Mother    Heart murmur Mother    Leukemia Mother    Throat cancer Maternal Grandmother    Colon cancer Neg Hx    Breast cancer Neg Hx     ALLERGIES:  has No Known Allergies.  MEDICATIONS:  Current Outpatient Medications  Medication Sig Dispense Refill   BEVESPI AEROSPHERE 9-4.8 MCG/ACT AERO Inhale into the lungs.     clopidogrel (PLAVIX) 75 MG tablet TAKE 1 TABLET BY MOUTH DAILY 100 tablet 2   levothyroxine (SYNTHROID) 100 MCG tablet Take 1 tablet (100 mcg total) by mouth daily before breakfast. 30 tablet 0   simvastatin (ZOCOR) 40 MG tablet Take 40 mg by mouth at bedtime.     aspirin EC 81 MG tablet Take 81 mg by mouth daily. Swallow whole. (Patient not taking: Reported on 08/29/2022)     No current facility-administered medications for this visit.     PHYSICAL EXAMINATION: ECOG PERFORMANCE STATUS: 1 - Symptomatic but completely ambulatory Vitals:   08/29/22 1023  BP: (!) 152/88  Pulse: (!) 106  Resp: 18  Temp: (!) 96.7 F (35.9 C)  SpO2: 100%   Filed Weights   08/29/22 1023  Weight: 220 lb 11.2 oz (100.1 kg)    Physical Exam Constitutional:      General: He is not in acute distress.    Appearance: He is not diaphoretic.     Comments: Patient walks with a cane  HENT:     Head: Normocephalic and atraumatic.     Nose: Nose normal.     Mouth/Throat:     Pharynx: No oropharyngeal exudate.  Eyes:     General: No scleral icterus.    Pupils: Pupils are equal, round, and reactive to light.  Cardiovascular:     Rate and Rhythm: Normal rate and regular rhythm.     Heart sounds: No murmur heard. Pulmonary:     Effort: Pulmonary effort is normal. No respiratory distress.     Breath sounds: No rales.  Chest:     Chest wall: No tenderness.  Abdominal:     General: There is no  distension.     Palpations: Abdomen is soft.     Tenderness: There is no abdominal tenderness.  Musculoskeletal:     Cervical back: Normal range of motion and neck supple.  Skin:    General: Skin is warm and dry.     Findings: No erythema.  Neurological:     Mental Status: He is alert and oriented to person, place, and time.     Cranial Nerves: No  cranial nerve deficit.     Motor: No abnormal muscle tone.     Coordination: Coordination normal.  Psychiatric:        Mood and Affect: Affect normal.      LABORATORY DATA:  I have reviewed the data as listed    Latest Ref Rng & Units 08/29/2022   10:08 AM 08/15/2022    8:47 AM 08/01/2022    8:49 AM  CBC  WBC 4.0 - 10.5 K/uL 3.9  4.6  6.7   Hemoglobin 13.0 - 17.0 g/dL 12.7  12.6  13.2   Hematocrit 39.0 - 52.0 % 38.3  38.5  40.1   Platelets 150 - 400 K/uL 224  163  182       Latest Ref Rng & Units 08/29/2022   10:08 AM 08/15/2022    8:47 AM 08/01/2022    8:49 AM  CMP  Glucose 70 - 99 mg/dL 196  106  111   BUN 8 - 23 mg/dL 15  21  26    Creatinine 0.61 - 1.24 mg/dL 1.13  1.03  1.15   Sodium 135 - 145 mmol/L 138  140  139   Potassium 3.5 - 5.1 mmol/L 3.6  3.8  3.6   Chloride 98 - 111 mmol/L 102  105  105   CO2 22 - 32 mmol/L 25  25  25    Calcium 8.9 - 10.3 mg/dL 8.9  9.2  8.6   Total Protein 6.5 - 8.1 g/dL 6.9  6.7  6.4   Total Bilirubin 0.3 - 1.2 mg/dL 0.4  0.3  0.2   Alkaline Phos 38 - 126 U/L 72  54  52   AST 15 - 41 U/L 29  27  29    ALT 0 - 44 U/L 26  38  50      RADIOGRAPHIC STUDIES: I have personally reviewed the radiological images as listed and agreed with the findings in the report. CT CHEST ABDOMEN PELVIS W CONTRAST  Result Date: 08/26/2022 CLINICAL DATA:  Squamous cell lung cancer treated with chemotherapy, radiation therapy and immunotherapy. Chronic shortness of breath. * Tracking Code: BO * EXAM: CT CHEST, ABDOMEN, AND PELVIS WITH CONTRAST TECHNIQUE: Multidetector CT imaging of the chest, abdomen and pelvis was  performed following the standard protocol during bolus administration of intravenous contrast. RADIATION DOSE REDUCTION: This exam was performed according to the departmental dose-optimization program which includes automated exposure control, adjustment of the mA and/or kV according to patient size and/or use of iterative reconstruction technique. CONTRAST:  154mL OMNIPAQUE IOHEXOL 300 MG/ML  SOLN COMPARISON:  05/28/2022. FINDINGS: CT CHEST FINDINGS Cardiovascular: Right IJ Port-A-Cath terminates in the right atrium. Atherosclerotic calcification of the aorta and coronary arteries. Heart size normal. Left ventricle appears hypertrophied with apical thinning. No pericardial effusion. Mediastinum/Nodes: No pathologically enlarged mediastinal lymph nodes. Right hilar soft tissue thickening, as before. No hilar or axillary adenopathy. Esophagus is grossly unremarkable. Lungs/Pleura: New ill-defined centrilobular nodularity bilaterally, including along the fissures. Perihilar post radiation pulmonary parenchymal retraction. Trace right pleural fluid. Debris is seen in the airway. Musculoskeletal: Degenerative changes in the spine. Old left rib fractures. No worrisome lytic or sclerotic lesions. CT ABDOMEN PELVIS FINDINGS Hepatobiliary: Liver is decreased in attenuation diffusely. Liver and gallbladder are otherwise unremarkable. No biliary ductal dilatation. Pancreas: Negative. Spleen: Negative. Adrenals/Urinary Tract: Adrenal glands and kidneys are unremarkable. Ureters are decompressed. Bladder is low in volume. Stomach/Bowel: Stomach, small bowel, appendix and colon are unremarkable. Vascular/Lymphatic: Atherosclerotic calcification of the aorta. No  pathologically enlarged lymph nodes. Bifurcating right femoral arterial graft with associated occlusion, as before. Reproductive: Prostate is visualized. Other: No free fluid.  Mesenteries and peritoneum are unremarkable. Musculoskeletal: Degenerative changes in the  spine. Postoperative changes in the lumbar spine. No worrisome lytic or sclerotic lesions. IMPRESSION: 1. New diffuse Peri lymphatic ill-defined nodularity can be seen in the setting of lymphocytic interstitial pneumonia. Although interlobular septal thickening is not an overwhelming feature, lymphangitic carcinomatosis cannot be definitively excluded. Etiologies such as hypersensitivity pneumonitis and smoking related respiratory bronchiolitis are considered less likely given the distribution and patient reportedly quit smoking in 2016. 2. Post radiation changes in the right lung. No evidence of distant metastatic disease. 3. Trace right pleural fluid. 4. Hepatic steatosis. 5. Aortic atherosclerosis (ICD10-I70.0). Coronary artery calcification. Chronically occluded right femoral arterial graft. Electronically Signed   By: Lorin Picket M.D.   On: 08/26/2022 10:55   MR BRAIN W WO CONTRAST  Result Date: 07/22/2022 CLINICAL DATA:  Lung carcinoma, treated metastatic disease. Stereotactic radio surgery. EXAM: MRI HEAD WITHOUT AND WITH CONTRAST TECHNIQUE: Multiplanar, multiecho pulse sequences of the brain and surrounding structures were obtained without and with intravenous contrast. CONTRAST:  81mL GADAVIST GADOBUTROL 1 MMOL/ML IV SOLN COMPARISON:  05/23/2022 brain MRI FINDINGS: Brain: No acute infarct or acute hemorrhage. No hydrocephalus or midline shift. There is multifocal hyperintense T2-weighted signal within the periventricular and deep white matter. There are no new or larger lesions. There are 4 lesions that are unchanged or smaller: 1. Left cerebellum, 2 mm, unchanged.  Series 1,023 image 162 2. Right occipital lobe, 8 mm, unchanged, image 137 3. Left frontoparietal junction, 20 mm pelvis slightly smaller. Markedly decreased contrast enhancement and decreased surrounding hyperintense T2-weighted signal. Image 93 4. Left postcentral gyrus, 7 mm, smaller, image 52 Vascular: Normal flow voids. Skull and  upper cervical spine: Normal bone marrow signal. Sinuses/Orbits: Normal orbits. Paranasal sinuses and mastoids are normal. Other: None IMPRESSION: 1. No new or enlarging lesions. No intracranial disease progression. 2. Decreased size of treated lesions of the left frontoparietal junction and left postcentral gyrus. 3. Unchanged lesions in the left cerebellum and right occipital lobe. Electronically Signed   By: Ulyses Jarred M.D.   On: 07/22/2022 23:04   NM Bone Scan Whole Body  Result Date: 06/10/2022 CLINICAL DATA:  Squamous cell carcinoma of the lung EXAM: NUCLEAR MEDICINE WHOLE BODY BONE SCAN TECHNIQUE: Whole body anterior and posterior images were obtained approximately 3 hours after intravenous injection of radiopharmaceutical. RADIOPHARMACEUTICALS:  20.51 mCi Technetium-36m MDP IV COMPARISON:  None Available Radiographic correlation: CT chest abdomen pelvis 05/28/2022 FINDINGS: Significant uptake at LEFT elbow corresponding to known fracture. Due to limitations of elbow positioning, LEFT hand projects over LEFT iliac wing on anterior view. Uptake at adjacent posterior LEFT tenth eleventh twelfth ribs, consistent with healing fractures. Uptake at costovertebral junction LEFT seventh rib corresponding to degenerative changes on CT. No worrisome sites of tracer uptake to suggest osseous metastatic disease. Portions of pelvis obscured by retained tracer within urinary bladder. Expected urinary tract and soft tissue distribution of tracer otherwise identified. IMPRESSION: Degenerative and posttraumatic type uptake as above. No scintigraphic evidence of osseous metastatic disease. Electronically Signed   By: Lavonia Dana M.D.   On: 06/10/2022 14:28

## 2022-08-29 NOTE — Assessment & Plan Note (Signed)
Synthroid 100 mcg daily

## 2022-08-29 NOTE — Assessment & Plan Note (Addendum)
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  Repeat CT scan showed improvement of patchy ground-glass opacities.  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  Labs reviewed and discussed with patient. Hold  Durvalumab Discussed with pulmonology Dr.Gonzalez. will check connective tissue panel, monoclonal gammopathy panel and HIV, he has no breathing difficulties, has upcoming appt with pulmonology

## 2022-08-31 LAB — T4: T4, Total: 9.5 ug/dL (ref 4.5–12.0)

## 2022-09-05 ENCOUNTER — Ambulatory Visit (INDEPENDENT_AMBULATORY_CARE_PROVIDER_SITE_OTHER): Payer: Medicare Other | Admitting: Nurse Practitioner

## 2022-09-05 ENCOUNTER — Encounter (INDEPENDENT_AMBULATORY_CARE_PROVIDER_SITE_OTHER): Payer: Self-pay | Admitting: Nurse Practitioner

## 2022-09-05 ENCOUNTER — Ambulatory Visit (INDEPENDENT_AMBULATORY_CARE_PROVIDER_SITE_OTHER): Payer: Medicare Other

## 2022-09-05 VITALS — BP 131/76 | HR 90 | Resp 16 | Wt 219.4 lb

## 2022-09-05 DIAGNOSIS — K219 Gastro-esophageal reflux disease without esophagitis: Secondary | ICD-10-CM

## 2022-09-05 DIAGNOSIS — J449 Chronic obstructive pulmonary disease, unspecified: Secondary | ICD-10-CM | POA: Diagnosis not present

## 2022-09-05 DIAGNOSIS — I70219 Atherosclerosis of native arteries of extremities with intermittent claudication, unspecified extremity: Secondary | ICD-10-CM

## 2022-09-05 DIAGNOSIS — E782 Mixed hyperlipidemia: Secondary | ICD-10-CM

## 2022-09-07 ENCOUNTER — Encounter (INDEPENDENT_AMBULATORY_CARE_PROVIDER_SITE_OTHER): Payer: Self-pay | Admitting: Nurse Practitioner

## 2022-09-07 NOTE — Progress Notes (Signed)
MRN : [id number]  Joshua [name] is a 67 y.o. (1955/12/12) male who presents with chief complaint of check circulation.  History of Present Illness:   The patient returns to the office for followup and review status post angiogram with intervention on 04/08/2022.   Procedure: Percutaneous transluminal angioplasty right profunda femoris artery Mechanical thrombectomy of the right profunda femoris artery using the Rota Rex catheter.   The patient notes improvement in the lower extremity symptoms. No interval shortening of the patient's claudication distance or rest pain symptoms. No new ulcers or wounds have occurred since the last visit.  There have been no significant changes to the patient's overall health care.  No documented history of amaurosis fugax or recent TIA symptoms. There are no recent neurological changes noted. No documented history of DVT, PE or superficial thrombophlebitis. The patient denies recent episodes of angina or shortness of breath.   ABI's Rt=0.63 and Lt=1.11  (previous ABI's Rt=0.65 and Lt=1.19)  Current Meds  Medication Sig   aspirin EC 81 MG tablet Take 81 mg by mouth daily. Swallow whole.   BEVESPI AEROSPHERE 9-4.8 MCG/ACT AERO Inhale into the lungs.   clopidogrel (PLAVIX) 75 MG tablet TAKE 1 TABLET BY MOUTH DAILY   levothyroxine (SYNTHROID) 100 MCG tablet Take 1 tablet (100 mcg total) by mouth daily before breakfast.   simvastatin (ZOCOR) 40 MG tablet Take 40 mg by mouth at bedtime.    Past Medical History:  Diagnosis Date   Allergy    Cancer ([place])    lung   COPD (chronic obstructive pulmonary disease) (HCC)    Dyspnea    former smoker. only doe   History of kidney stones    Hypertension    Peripheral vascular disease ([place])    Pre-diabetes    Sleep apnea     Past Surgical History:  Procedure Laterality Date   ANTERIOR CERVICAL DECOMP/DISCECTOMY FUSION N/A 03/14/2020   Procedure: ANTERIOR CERVICAL  DECOMPRESSION/DISCECTOMY FUSION 3 LEVELS C4-7;  Surgeon: Meade Maw, MD;  Location: [facility] ORS;  Service: Neurosurgery;  Laterality: N/A;   BACK SURGERY  2011   CENTRAL LINE INSERTION Right 09/10/2016   Procedure: CENTRAL LINE INSERTION;  Surgeon: Katha Cabal, MD;  Location: [facility] ORS;  Service: Vascular;  Laterality: Right;   CERVICAL FUSION     C4-c7 on Mar 14, 2020   COLONOSCOPY  2013 ?   COLONOSCOPY N/A 09/09/2021   Procedure: COLONOSCOPY;  Surgeon: [name], DO;  Location: [facility] ENDOSCOPY;  Service: Gastroenterology;  Laterality: N/A;   CORONARY ANGIOPLASTY     ESOPHAGOGASTRODUODENOSCOPY N/A 09/09/2021   Procedure: ESOPHAGOGASTRODUODENOSCOPY (EGD);  Surgeon: [name], DO;  Location: [facility] ENDOSCOPY;  Service: Gastroenterology;  Laterality: N/A;   FEMORAL-POPLITEAL BYPASS GRAFT Right 09/10/2016   Procedure: BYPASS GRAFT FEMORAL-POPLITEAL ARTERY ( BELOW KNEE );  Surgeon: Katha Cabal, MD;  Location: [facility] ORS;  Service: Vascular;  Laterality: Right;   FEMORAL-TIBIAL BYPASS GRAFT Right 03/23/2020   Procedure: BYPASS GRAFT RIGHT FEMORAL- DISTAL TIBIAL ARTERY WITH DISTAL FLOW GRAFT;  Surgeon: Katha Cabal, MD;  Location: [facility] ORS;  Service: Vascular;  Laterality: Right;   LEFT HEART CATH AND CORONARY ANGIOGRAPHY Left 05/16/2021   Procedure: LEFT HEART CATH AND CORONARY ANGIOGRAPHY;  Surgeon: Andrez Grime, MD;  Location: [place] CV LAB;  Service: Cardiovascular;  Laterality: Left;   LOWER EXTREMITY ANGIOGRAPHY Right 11/18/2016   Procedure: Lower Extremity Angiography;  Surgeon: Katha Cabal, MD;  Location: [place] [facility];  Service: Cardiovascular;  Laterality: Right;   LOWER EXTREMITY ANGIOGRAPHY Right 12/09/2016   Procedure: Lower Extremity Angiography;  Surgeon: Katha Cabal, MD;  Location: [place] [facility];  Service: Cardiovascular;  Laterality: Right;   LOWER EXTREMITY ANGIOGRAPHY Right 11/15/2019   Procedure: LOWER  EXTREMITY ANGIOGRAPHY;  Surgeon: Katha Cabal, MD;  Location: [place] [facility];  Service: Cardiovascular;  Laterality: Right;   LOWER EXTREMITY ANGIOGRAPHY Right 12/01/2019   Procedure: Lower Extremity Angiography;  Surgeon: Algernon Huxley, MD;  Location: [place] [facility];  Service: Cardiovascular;  Laterality: Right;   LOWER EXTREMITY ANGIOGRAPHY Right 12/02/2019   Procedure: LOWER EXTREMITY ANGIOGRAPHY;  Surgeon: Katha Cabal, MD;  Location: [place] [facility];  Service: Cardiovascular;  Laterality: Right;   LOWER EXTREMITY ANGIOGRAPHY Right 03/23/2020   Procedure: Lower Extremity Angiography;  Surgeon: Katha Cabal, MD;  Location: [place] [facility];  Service: Cardiovascular;  Laterality: Right;   LOWER EXTREMITY ANGIOGRAPHY Right 09/25/2021   Procedure: Lower Extremity Angiography;  Surgeon: Katha Cabal, MD;  Location: [place] [facility];  Service: Cardiovascular;  Laterality: Right;   LOWER EXTREMITY ANGIOGRAPHY Right 09/26/2021   Procedure: Lower Extremity Angiography;  Surgeon: Algernon Huxley, MD;  Location: [place] [facility];  Service: Cardiovascular;  Laterality: Right;   LOWER EXTREMITY ANGIOGRAPHY Right 04/08/2022   Procedure: Lower Extremity Angiography;  Surgeon: Katha Cabal, MD;  Location: [place] [facility];  Service: Cardiovascular;  Laterality: Right;   LOWER EXTREMITY INTERVENTION  11/18/2016   Procedure: Lower Extremity Intervention;  Surgeon: Katha Cabal, MD;  Location: [place] [facility];  Service: Cardiovascular;;   PERIPHERAL VASCULAR CATHETERIZATION Right 01/02/2015   Procedure: Lower Extremity Angiography;  Surgeon: Katha Cabal, MD;  Location: [place] [facility];  Service: Cardiovascular;  Laterality: Right;   PERIPHERAL VASCULAR CATHETERIZATION Right 06/18/2015   Procedure: Lower Extremity Angiography;  Surgeon: Algernon Huxley, MD;  Location: [place] [facility];  Service: Cardiovascular;  Laterality: Right;   PERIPHERAL  VASCULAR CATHETERIZATION  06/18/2015   Procedure: Lower Extremity Intervention;  Surgeon: Algernon Huxley, MD;  Location: [place] [facility];  Service: Cardiovascular;;   PERIPHERAL VASCULAR CATHETERIZATION N/A 10/09/2015   Procedure: Abdominal Aortogram w/Lower Extremity;  Surgeon: Katha Cabal, MD;  Location: [place] [facility];  Service: Cardiovascular;  Laterality: N/A;   PERIPHERAL VASCULAR CATHETERIZATION  10/09/2015   Procedure: Lower Extremity Intervention;  Surgeon: Katha Cabal, MD;  Location: [place] [facility];  Service: Cardiovascular;;   PERIPHERAL VASCULAR CATHETERIZATION Right 10/10/2015   Procedure: Lower Extremity Angiography;  Surgeon: Algernon Huxley, MD;  Location: [place] [facility];  Service: Cardiovascular;  Laterality: Right;   PERIPHERAL VASCULAR CATHETERIZATION Right 10/10/2015   Procedure: Lower Extremity Intervention;  Surgeon: Algernon Huxley, MD;  Location: [place] [facility];  Service: Cardiovascular;  Laterality: Right;   PERIPHERAL VASCULAR CATHETERIZATION Right 12/03/2015   Procedure: Lower Extremity Angiography;  Surgeon: Algernon Huxley, MD;  Location: [place] [facility];  Service: Cardiovascular;  Laterality: Right;   PERIPHERAL VASCULAR CATHETERIZATION Right 12/04/2015   Procedure: Lower Extremity Angiography;  Surgeon: Algernon Huxley, MD;  Location: [place] [facility];  Service: Cardiovascular;  Laterality: Right;   PERIPHERAL VASCULAR CATHETERIZATION  12/04/2015   Procedure: Lower Extremity Intervention;  Surgeon: Algernon Huxley, MD;  Location: [place] [facility];  Service: Cardiovascular;;   PERIPHERAL  VASCULAR CATHETERIZATION Right 06/30/2016   Procedure: Lower Extremity Angiography;  Surgeon: Algernon Huxley, MD;  Location: [place] [facility];  Service: Cardiovascular;  Laterality: Right;   PERIPHERAL VASCULAR CATHETERIZATION Right 06/25/2016   Procedure: Lower Extremity Angiography;  Surgeon: Katha Cabal, MD;  Location: [place] [facility];  Service:  Cardiovascular;  Laterality: Right;   PERIPHERAL VASCULAR CATHETERIZATION Right 07/01/2016   Procedure: Lower Extremity Angiography;  Surgeon: Katha Cabal, MD;  Location: [place] [facility];  Service: Cardiovascular;  Laterality: Right;   PERIPHERAL VASCULAR CATHETERIZATION  07/01/2016   Procedure: Lower Extremity Intervention;  Surgeon: Katha Cabal, MD;  Location: [place] [facility];  Service: Cardiovascular;;   PERIPHERAL VASCULAR CATHETERIZATION Right 08/01/2016   Procedure: Lower Extremity Angiography;  Surgeon: Katha Cabal, MD;  Location: [place] [facility];  Service: Cardiovascular;  Laterality: Right;   PORTA CATH INSERTION N/A 10/05/2020   Procedure: PORTA CATH INSERTION;  Surgeon: Katha Cabal, MD;  Location: [place] [facility];  Service: Cardiovascular;  Laterality: N/A;   stent placement in right leg Right    VIDEO BRONCHOSCOPY N/A 09/14/2020   Procedure: VIDEO BRONCHOSCOPY WITH FLUORO;  Surgeon: Tyler Pita, MD;  Location: [facility]S;  Service: Cardiopulmonary;  Laterality: N/A;   VIDEO BRONCHOSCOPY WITH ENDOBRONCHIAL ULTRASOUND N/A 09/14/2020   Procedure: VIDEO BRONCHOSCOPY WITH ENDOBRONCHIAL ULTRASOUND;  Surgeon: Tyler Pita, MD;  Location: [facility];  Service: Cardiopulmonary;  Laterality: N/A;    Social History Social History   Tobacco Use   Smoking status: Former    Packs/day: 2.00    Years: 38.00    Total pack years: 76.00    Types: Cigarettes    Quit date: 12/20/2014    Years since quitting: 7.7   Smokeless tobacco: Never   Tobacco comments:    quit smoking in 2017 most smoked 2   Vaping Use   Vaping Use: Former  Substance Use Topics   Alcohol use: Not Currently   Drug use: No    Family History Family History  Problem Relation Age of Onset   Diabetes Mother    Hypertension Mother    Heart murmur Mother    Leukemia Mother    Throat cancer Maternal Grandmother    Colon cancer Neg Hx    Breast cancer Neg Hx     No  Known Allergies   REVIEW OF SYSTEMS (Negative unless checked)  Constitutional: [] Weight loss  [] Fever  [] Chills Cardiac: [] Chest pain   [] Chest pressure   [] Palpitations   [] Shortness of breath when laying flat   [] Shortness of breath with exertion. Vascular:  [x] Pain in legs with walking   [] Pain in legs at rest  [] History of DVT   [] Phlebitis   [] Swelling in legs   [] Varicose veins   [] Non-healing ulcers Pulmonary:   [] Uses home oxygen   [] Productive cough   [] Hemoptysis   [] Wheeze  [] COPD   [] Asthma Neurologic:  [] Dizziness   [] Seizures   [] History of stroke   [] History of TIA  [] Aphasia   [] Vissual changes   [] Weakness or numbness in arm   [] Weakness or numbness in leg Musculoskeletal:   [] Joint swelling   [] Joint pain   [] Low back pain Hematologic:  [] Easy bruising  [] Easy bleeding   [] Hypercoagulable state   [] Anemic Gastrointestinal:  [] Diarrhea   [] Vomiting  [] Gastroesophageal reflux/heartburn   [] Difficulty swallowing. Genitourinary:  [] Chronic kidney disease   [] Difficult urination  [] Frequent urination   [] Blood in urine Skin:  []   Rashes   [] Ulcers  Psychological:  [] History of anxiety   []  History of major depression.  Physical Examination  Vitals:   09/05/22 1340  BP: 131/76  Pulse: 90  Resp: 16  Weight: 219 lb 6.4 oz (99.5 kg)   Body mass index is 30.6 kg/m. Gen: WD/WN, NAD Head: [place]/AT, No temporalis wasting.  Ear/Nose/Throat: Hearing grossly intact, nares w/o erythema or drainage Eyes: PER, EOMI, sclera nonicteric.  Neck: Supple, no masses.  No bruit or JVD.  Pulmonary:  Good air movement, no audible wheezing, no use of accessory muscles.  Cardiac: RRR, normal S1, S2, no Murmurs. Vascular:  mild trophic changes, no open wounds Vessel Right Left  Radial Palpable Palpable  PT Not Palpable  Palpable  DP Not Palpable  Palpable  Gastrointestinal: soft, non-distended. No guarding/no peritoneal signs.  Musculoskeletal: M/S 5/5 throughout.  No visible deformity.   Neurologic: CN 2-12 intact. Pain and light touch intact in extremities.  Symmetrical.  Speech is fluent. Motor exam as listed above. Psychiatric: Judgment intact, Mood & affect appropriate for pt's clinical situation. Dermatologic: No rashes or ulcers noted.  No changes consistent with cellulitis.   CBC Lab Results  Component Value Date   WBC 3.9 (L) 08/29/2022   HGB 12.7 (L) 08/29/2022   HCT 38.3 (L) 08/29/2022   MCV 84.2 08/29/2022   PLT 224 08/29/2022    BMET    Component Value Date/Time   NA 138 08/29/2022 1008   NA 141 10/14/2019 1101   K 3.6 08/29/2022 1008   CL 102 08/29/2022 1008   CO2 25 08/29/2022 1008   GLUCOSE 196 (H) 08/29/2022 1008   BUN 15 08/29/2022 1008   BUN 15 10/14/2019 1101   BUN 16 07/03/2014 1205   CREATININE 1.13 08/29/2022 1008   CREATININE 1.34 (H) 07/03/2014 1205   CALCIUM 8.9 08/29/2022 1008   GFRNONAA >60 08/29/2022 1008   GFRNONAA 59 (L) 07/03/2014 1205   GFRAA >60 03/30/2020 0536   GFRAA >60 07/03/2014 1205   Estimated Creatinine Clearance: 79.4 mL/min (by C-G formula based on SCr of 1.13 mg/dL).  COAG Lab Results  Component Value Date   INR 1.2 09/26/2021   INR 1.2 08/25/2020   INR 1.0 03/05/2020    Radiology CT CHEST ABDOMEN PELVIS W CONTRAST  Result Date: 08/26/2022 CLINICAL DATA:  Squamous cell lung cancer treated with chemotherapy, radiation therapy and immunotherapy. Chronic shortness of breath. * Tracking Code: BO * EXAM: CT CHEST, ABDOMEN, AND PELVIS WITH CONTRAST TECHNIQUE: Multidetector CT imaging of the chest, abdomen and pelvis was performed following the standard protocol during bolus administration of intravenous contrast. RADIATION DOSE REDUCTION: This exam was performed according to the departmental dose-optimization program which includes automated exposure control, adjustment of the mA and/or kV according to patient size and/or use of iterative reconstruction technique. CONTRAST:  152mL OMNIPAQUE IOHEXOL 300 MG/ML  SOLN  COMPARISON:  05/28/2022. FINDINGS: CT CHEST FINDINGS Cardiovascular: Right IJ Port-A-Cath terminates in the right atrium. Atherosclerotic calcification of the aorta and coronary arteries. Heart size normal. Left ventricle appears hypertrophied with apical thinning. No pericardial effusion. Mediastinum/Nodes: No pathologically enlarged mediastinal lymph nodes. Right hilar soft tissue thickening, as before. No hilar or axillary adenopathy. Esophagus is grossly unremarkable. Lungs/Pleura: New ill-defined centrilobular nodularity bilaterally, including along the fissures. Perihilar post radiation pulmonary parenchymal retraction. Trace right pleural fluid. Debris is seen in the airway. Musculoskeletal: Degenerative changes in the spine. Old left rib fractures. No worrisome lytic or sclerotic lesions. CT ABDOMEN PELVIS FINDINGS Hepatobiliary:  Liver is decreased in attenuation diffusely. Liver and gallbladder are otherwise unremarkable. No biliary ductal dilatation. Pancreas: Negative. Spleen: Negative. Adrenals/Urinary Tract: Adrenal glands and kidneys are unremarkable. Ureters are decompressed. Bladder is low in volume. Stomach/Bowel: Stomach, small bowel, appendix and colon are unremarkable. Vascular/Lymphatic: Atherosclerotic calcification of the aorta. No pathologically enlarged lymph nodes. Bifurcating right femoral arterial graft with associated occlusion, as before. Reproductive: Prostate is visualized. Other: No free fluid.  Mesenteries and peritoneum are unremarkable. Musculoskeletal: Degenerative changes in the spine. Postoperative changes in the lumbar spine. No worrisome lytic or sclerotic lesions. IMPRESSION: 1. New diffuse Peri lymphatic ill-defined nodularity can be seen in the setting of lymphocytic interstitial pneumonia. Although interlobular septal thickening is not an overwhelming feature, lymphangitic carcinomatosis cannot be definitively excluded. Etiologies such as hypersensitivity pneumonitis and  smoking related respiratory bronchiolitis are considered less likely given the distribution and patient reportedly quit smoking in 2016. 2. Post radiation changes in the right lung. No evidence of distant metastatic disease. 3. Trace right pleural fluid. 4. Hepatic steatosis. 5. Aortic atherosclerosis (ICD10-I70.0). Coronary artery calcification. Chronically occluded right femoral arterial graft. Electronically Signed   By: Lorin Picket M.D.   On: 08/26/2022 10:55     Assessment/Plan 1. Atherosclerosis of lower extremity with claudication ([place]) Recommend:  The patient is status post successful angiogram with intervention.  The patient reports that the claudication symptoms and leg pain has improved.   The patient denies lifestyle limiting changes at this point in time.  No further invasive studies, angiography or surgery at this time The patient should continue walking and begin a more formal exercise program.  The patient should continue antiplatelet therapy and aggressive treatment of the lipid abnormalities  Continued surveillance is indicated as atherosclerosis is likely to progress with time.    Patient should undergo noninvasive studies as ordered. The patient will follow up with me to review the studies.  - VAS [place] ABI WITH/WO TBI - VAS [place] ABI WITH/WO TBI; Future  2. Chronic obstructive pulmonary disease, unspecified COPD type ([place]) Continue pulmonary medications and aerosols as already ordered, these medications have been reviewed and there are no changes at this time.   3. Gastroesophageal reflux disease without esophagitis Continue PPI as already ordered, this medication has been reviewed and there are no changes at this time.  Avoidence of caffeine and alcohol  Moderate elevation of the head of the bed   4. Mixed hyperlipidemia Continue statin as ordered and reviewed, no changes at this time   Kris Hartmann, NP  09/07/2022 2:06 AM

## 2022-09-10 LAB — VAS US ABI WITH/WO TBI
Left ABI: 1.11
Right ABI: 0.63

## 2022-09-11 ENCOUNTER — Other Ambulatory Visit: Payer: Self-pay | Admitting: *Deleted

## 2022-09-11 ENCOUNTER — Encounter: Payer: Self-pay | Admitting: Pulmonary Disease

## 2022-09-11 ENCOUNTER — Encounter: Payer: Self-pay | Admitting: *Deleted

## 2022-09-11 ENCOUNTER — Ambulatory Visit (INDEPENDENT_AMBULATORY_CARE_PROVIDER_SITE_OTHER): Payer: Medicare Other | Admitting: Pulmonary Disease

## 2022-09-11 VITALS — BP 120/60 | HR 100 | Temp 98.1°F | Ht 71.0 in | Wt 220.8 lb

## 2022-09-11 DIAGNOSIS — J842 Lymphoid interstitial pneumonia: Secondary | ICD-10-CM

## 2022-09-11 DIAGNOSIS — C3491 Malignant neoplasm of unspecified part of right bronchus or lung: Secondary | ICD-10-CM

## 2022-09-11 DIAGNOSIS — J439 Emphysema, unspecified: Secondary | ICD-10-CM

## 2022-09-11 DIAGNOSIS — J691 Pneumonitis due to inhalation of oils and essences: Secondary | ICD-10-CM | POA: Diagnosis not present

## 2022-09-11 DIAGNOSIS — R0602 Shortness of breath: Secondary | ICD-10-CM | POA: Diagnosis not present

## 2022-09-11 DIAGNOSIS — J4489 Other specified chronic obstructive pulmonary disease: Secondary | ICD-10-CM | POA: Diagnosis not present

## 2022-09-11 NOTE — Progress Notes (Signed)
Per Dr. Patsey Berthold, pt needs additional lab work to be checked for possible lymphoid interstitial pneumonia. Pt requests to have labs drawn at the [facility] on 3/8 when comes in for visit. Okay to place orders per Dr. Tasia Catchings. Dr. Patsey Berthold will follow up on results.

## 2022-09-11 NOTE — Progress Notes (Signed)
Subjective:    Patient ID: Joshua [name], male    DOB: [birth year], 67 y.o.   MRN: [id number] Patient Care Team: [name], MD as PCP - General (Physician Assistant) [name], MD as Referring Physician (Physician Assistant) Bary Castilla, [name], MD (General Surgery) Telford Nab, RN as Oncology Nurse Navigator [name], MD as Consulting Physician (Oncology)  Chief Complaint  Patient presents with   Follow-up    SOB with exertion, dry cough and wheezing.     HPI Patient is a 67 year old former smoker (9 PY) with a history of COPD, shortness of breath on exertion and squamous cell carcinoma of the right upper lobe, stage IV disease.  The patient received concomitant chemoradiation followed by durvalumab maintenance as he is PD-L1 positive.  He had a break on durvalumab therapy due to groundglass opacities noted on CT chest that were consistent with perhaps point inhibitor pneumonitis.  A follow-up CT chest performed on 15 September showed marked improvement on the groundglass opacities.  The patient then restarted Durvalumab.  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  There are postradiation changes in the right lung with no evidence of distant metastatic disease.  He has not had any symptoms related to this.  His Durvalumab was then discontinued on 9 February after the CT results were known.  His disease was initially staged as a III A however in March 2023 he was noted to have 2 enhancing lesions in the brain and is now staged at a IV.   Has not had any fevers, chills or sweats since his last visit. He has completed radiation therapy to the brain.  He has upcoming imaging of the brain in March.  He is on Eliquis and Plavix for his severe peripheral artery disease status post thrombosis and subsequent thrombectomy of the right lower  extremity.   Previously he had been on Stiolto however because of insurance coverage this has been switched to Bevespi 2 puffs twice a day.  He notes that this is just as effective as Stiolto.  He continues to complain of shortness of breath however still feels that the [place] does help.  He has not had any cough or sputum production.  No hemoptysis.     DATA 07/18/2021 PFTs: FEV1 2.80 L or 76% predicted, FVC 3.61 L or 74% predicted, there is a small airways component.  No significant bronchodilator response.  Lung volumes mildly reduced.  Diffusion capacity 71% by [place].  Review of Systems A 10 point review of systems was performed and it is as noted above otherwise negative.  Patient Active Problem List   Diagnosis Date Noted   Supracondylar fracture of left humerus 05/23/2022   GERD (gastroesophageal reflux disease) 01/25/2022   Pneumonitis 01/25/2022   COPD (chronic obstructive pulmonary disease) ([place]) 12/28/2021   Hypocalcemia 12/28/2021   Malignant neoplasm metastatic to brain ([place]) 10/08/2021   Hypothyroidism, unspecified 10/08/2021   Parotid nodule 08/16/2021   Dysphagia 06/20/2021   COVID 06/20/2021   Organizing pneumonia ([place]) 04/04/2021   Hypothyroidism due to medication 04/04/2021   Encounter for antineoplastic immunotherapy 04/04/2021   Antineoplastic chemotherapy induced anemia 10/17/2020   Squamous cell carcinoma of right lung ([place]) 09/20/2020   Goals of care, counseling/discussion 08/30/2020   Iron deficiency anemia 08/30/2020   Ischemia of extremity 03/23/2020   Cervical myelopathy ([place]) 03/14/2020   Ischemia of  right lower extremity 11/15/2019   Peripheral neuropathy 10/14/2019   Chronic left-sided low back pain without sciatica 04/13/2019   History of back surgery 04/13/2019   Atherosclerosis of lower extremity with claudication ([place]) [id number]   Complication associated with vascular device 11/13/2016   Hyperlipidemia, unspecified 06/18/2016   Radiculopathy  of lumbar region 06/10/2016   PVD (peripheral vascular disease) ([place]) 05/08/2016   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 Swallow whole.   BEVESPI AEROSPHERE 9-4.8 MCG/ACT AERO Inhale into the lungs.   clopidogrel (PLAVIX) 75 MG tablet TAKE 1 TABLET BY MOUTH DAILY   levothyroxine (SYNTHROID) 100 MCG tablet Take 1 tablet (100 mcg total) by mouth daily before breakfast.   simvastatin (ZOCOR) 40 MG tablet Take 40 mg by mouth at bedtime.       Objective:   Physical Exam BP 120/60 (BP Location: Left Arm, Cuff Size: Normal)   Pulse 100   Temp 98.1 F (36.7 C) (Temporal)   Ht '5\' 11"'$  (1.803 m)   Wt 220 lb 12.8 oz (100.2 kg)   SpO2 98%   BMI 30.80 kg/m   SpO2: 98 % O2 Device: None (Room air)  GENERAL: Chronically ill-appearing well-developed well-nourished gentleman, mildly tachypneic.  Ambulatory with assistance of cane.   HEAD: Normocephalic, atraumatic.  EYES: Pupils equal, round, reactive to light.  No scleral icterus.  MOUTH: Oral mucosa moist, no thrush. NECK: Supple.  Limited ROM due to prior spinal fusion.  No thyromegaly. Trachea midline. No JVD.  No adenopathy. PULMONARY: Good air entry bilaterally.  Few rhonchi, few end expiratory wheezes. CARDIOVASCULAR: S1 and S2. Regular rate and rhythm.  No rubs, murmurs or gallops heard. ABDOMEN: Benign. MUSCULOSKELETAL: No joint deformity, no clubbing, no edema.  NEUROLOGIC: Mild psychomotor retardation noted, mild unsteady gait. SKIN:  Intact,warm,dry. PSYCH: Taciturn but cooperative.       Assessment & Plan:     ICD-10-CM   1. SOB (shortness of breath)  R06.02 Pulmonary Function Test [facility] Only   Reassess with PFT Likely multifactorial COPD/lung cancer/prior radiation/deconditioning    2. COPD with chronic bronchitis and emphysema ([place])  J44.89    J43.9    Continue Bevespi 2 puffs twice a day Patient declines albuterol Reassess with PFTs    3. 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 Squamous cell carcinoma of right lung (HCC)  C34.91    Tissue adds complexity to his management Followed by oncology     Orders Placed This Encounter  Procedures   Pulmonary Function Test [facility] Only    Standing Status:   Future    Standing Expiration Date:   09/12/2023    Order Specific Question:   Full PFT: includes the  following: basic spirometry, spirometry pre & post bronchodilator, diffusion capacity (DLCO), lung volumes    Answer:   Full PFT    Order Specific Question:   This test can only be performed at    Answer:   [facility]   Patient has requested that blood work be done through the cancer center.  This was relayed to [facility], RN, lung cancer navigator and she will have these ordered.  Will see the patient in follow-up in 6 to 8 weeks time he is to contact us prior to that time should any new difficulties arise.  Renold Don, MD Advanced Bronchoscopy PCCM [facility]    *This note was dictated using voice recognition software/Dragon.  Despite best efforts to proofread, errors can occur which can change the meaning. Any transcriptional errors that result from this process are unintentional and may not be fully corrected at the time of dictation.

## 2022-09-11 NOTE — Patient Instructions (Addendum)
We are ordering breathing test.  Blood work is going to be done through the cancer center.  I already sent the cancer center what lab work needs to be done.  They will collect it  on the eighth when you are there.  There are no changes in your inhalers for now.  Will see you in follow-up in 6 to 8 weeks call sooner should any new problems arise.

## 2022-09-12 ENCOUNTER — Encounter: Payer: Self-pay | Admitting: Pulmonary Disease

## 2022-09-12 ENCOUNTER — Other Ambulatory Visit: Payer: Medicare Other

## 2022-09-12 ENCOUNTER — Ambulatory Visit: Payer: Medicare Other | Admitting: Oncology

## 2022-09-12 ENCOUNTER — Ambulatory Visit: Payer: Medicare Other

## 2022-09-13 ENCOUNTER — Encounter: Payer: Self-pay | Admitting: Pulmonary Disease

## 2022-09-26 ENCOUNTER — Inpatient Hospital Stay: Payer: Medicare Other | Attending: Oncology | Admitting: Oncology

## 2022-09-26 ENCOUNTER — Other Ambulatory Visit: Payer: Medicare Other

## 2022-09-26 ENCOUNTER — Ambulatory Visit: Payer: Medicare Other

## 2022-09-26 ENCOUNTER — Inpatient Hospital Stay: Payer: Medicare Other

## 2022-09-26 ENCOUNTER — Encounter: Payer: Self-pay | Admitting: Oncology

## 2022-09-26 VITALS — BP 138/77 | HR 108 | Temp 97.4°F | Wt 218.4 lb

## 2022-09-26 DIAGNOSIS — C7931 Secondary malignant neoplasm of brain: Secondary | ICD-10-CM

## 2022-09-26 DIAGNOSIS — E032 Hypothyroidism due to medicaments and other exogenous substances: Secondary | ICD-10-CM | POA: Diagnosis not present

## 2022-09-26 DIAGNOSIS — J842 Lymphoid interstitial pneumonia: Secondary | ICD-10-CM

## 2022-09-26 DIAGNOSIS — C3491 Malignant neoplasm of unspecified part of right bronchus or lung: Secondary | ICD-10-CM

## 2022-09-26 DIAGNOSIS — C3411 Malignant neoplasm of upper lobe, right bronchus or lung: Secondary | ICD-10-CM | POA: Insufficient documentation

## 2022-09-26 LAB — C-REACTIVE PROTEIN: CRP: 0.6 mg/dL (ref <1.0)

## 2022-09-26 LAB — SEDIMENTATION RATE: Sed Rate: 17 mm/hr (ref 0–20)

## 2022-09-26 NOTE — Assessment & Plan Note (Signed)
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  Repeat CT scan showed improvement of patchy ground-glass opacities.  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  Pending pulmonology work up. Hold off Durvalumab.  check connective tissue panel, monoclonal gammopathy panel and HIV, he has no breathing difficulties, has upcoming appt with pulmonology

## 2022-09-26 NOTE — Assessment & Plan Note (Signed)
Status post radiation,  02/19/22 MRI showed decreased brain lesion size and edema.  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   The small left anterior cerebellar lesion is stable. I have communicated with Dr.Chrystal, he recommends no radiation of left cerebellar lesion. Continue observation.  Repeat MRI end of March 2024

## 2022-09-26 NOTE — Assessment & Plan Note (Signed)
Synthroid 100 mcg daily  

## 2022-09-26 NOTE — Progress Notes (Signed)
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  Repeat CT scan showed improvement of patchy ground-glass opacities.  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  Pending pulmonology work up. Hold off Durvalumab.  check connective tissue panel, monoclonal gammopathy panel and HIV, he has no breathing difficulties, has upcoming appt with pulmonology  Malignant neoplasm metastatic to brain [facility]) Status post radiation,  02/19/22 MRI showed decreased brain lesion size and edema.  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   The small left anterior cerebellar lesion is stable. I have communicated with Dr.Chrystal, he recommends no radiation of left cerebellar lesion. Continue observation.  Repeat MRI end of March 2024    Hypothyroidism due to medication Synthroid 100 mcg daily    No orders of the defined types were placed in this encounter.  Follow up to be determined.  All questions were answered. The patient knows to call the clinic with any problems, questions or concerns.  [name], MD, PhD [facility] Hematology Oncology 09/26/2022       HISTORY OF PRESENTING ILLNESS:  Joshua [name] is a 67 y.o. male who has above history reviewed by me  today presents for follow up visit for Stage IV lung squamous cell carcinoma. 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  Hypermetabolic cluster right paratracheal adenopathy with maximum SUV up to 12.  No findings of metastatic disease to the neck/abdomen/pelvis or skeleton.   09/20/2020 Initial Diagnosis   Squamous cell carcinoma of right lung (HCC) NGS showed PD-L1 TPS 95%, LRP1B S980f, PIK3CA E545K, RB1 c1422-2A>T, RIT1 T83_ A84del, STK11 P2860f TPS53 R248W, TMB 19.2m78mmb, MS stable.    09/20/2020 Cancer Staging   Staging form: Lung, AJCC 8th Edition - Clinical stage from 09/20/2020: Stage IV (cT1b, cN2, cM1) - Signed by Reagan Klemz,Earlie ServerD on 12/13/2021   10/03/2020 Imaging   MRI brain with and without contrast showed no evidence of intracranial metastatic disease. Well-defined round lesion within the left parotid tail measuring approximately 2.3 x 1.8 x 1.8 cm   10/09/2020 - 11/27/2020 Radiation Therapy   concurrent chemoradiation   10/10/2020 - 10/31/2020 Chemotherapy   10/10/2020 concurrent chemoradiation.  carboplatin AUC of 2 and Taxol 45 mg/m2 x 4.  Additional chemotherapy was held due to thrombocytopenia, neutropenia       12/31/2020 - 03/21/2022 Chemotherapy   Maintenance  Durvalumab q14d      12/31/2020 -  Chemotherapy   Patient is on Treatment Plan : LUNG Durvalumab (10) q14d     03/28/2021 Imaging   CT without contrast showed new linear right perihilar consolidation with associated traction bronchiectasis.  Likely postradiation changes.  Additional new peribronchial vascular/nodular groundglass opacities with areas of consolidation or seen primarily in the right upper  and lower lobes, more distant from expected radiation fields. 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  Per my discussion  with pulmonology Dr. 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     05/09/2021 Imaging   Repeat CT chest without contrast showed evolving radiation changes in the right perihilar region likely accounting for interval increase to thickening of the posterior wall of the right upper lobe bronchus.  The more peripheral right lung groundglass opacities seen on most recent study have generally improved with exception of one peripherally right upper lobe which appears new.  These are likely inflammatory.  No findings highly suspicious for local recurrence.   05/30/2021 - 01/10/2022 Chemotherapy   Durvalumab q14d     10/07/2021 Imaging   MRI brain with and without contrast showed 2 contrast-enhancing lesions, most consistent with metastatic disease.  Largest lesion is in the left frontal lobe and measures up to 1.3 cm with a large amount of surrounding edema and 9 mm rightward midline shift.  The other enhancing lesion was 4 mm in the right occipital lobe with a small amount of surrounding edema.  No acute or chronic hemorrhage.   10/07/2021 Progression   Headache after colonoscopy, progressive difficulty using her right arm.  Progressively worsening of speech. Stage IV lung cancer with brain metastasis.  started on Dexamethasone.  He was seen by neurosurgery and radiation oncology.  Recommendation is to proceed with SRS.  Patient was discharged with dexamethasone   10/16/2021 Imaging   PET scan showed postradiation changes in the right supra hilar lung without evidence of local recurrence.  No evidence of lung cancer recurrence or metastatic disease.  Single focus of intense metabolic activity associated with posterior left 11th rib likely secondary to recent fall/posttraumatic metabolic changes.  Frontal  uptake in the left frontal lobe corresponding to the known metastatic lesion in the brain   10/29/2021 -  Radiation Therapy   Brain Radiation [facility]   12/10/2021 Imaging   Brain  MRI: Continued interval decrease in size of a left frontal lobe metastasis, now measuring 10 mm. Moderate surrounding edema has also decreased.  3-4 mm metastasis within the right occipital lobe, not significantly changed in size. There is now a small focus of central non enhancement within this lesion. Increased edema. 3 mm enhancing metastasis within the high left parietal lobe. In retrospect, this was likely present as a subtle punctate focus of enhancement on the prior brain MRI of 10/22/2021.   01/18/2022 Imaging   CT chest abdomen pelvis w contrast 1. Stable post treatment changes in the right upper lobe medially with dense radiation fibrosis but no findings to suggest local recurrent tumor. 2. Significant progression of ill-defined ground-glass opacity, interstitial thickening and airspace nodularity in the right upper lobe and upper aspect of the right lower lobe. Findings could reflect an inflammatory or atypical infectious process, drug induced pneumonitis or interstitial spread of tumor.3. No mediastinal or hilar mass or adenopathy. 4. No findings for abdominal/pelvic metastatic disease or osseous metastatic disease.   02/19/2022 Imaging   MRI brain w wo contrast showed Three metastatic deposits in the brain appear smaller. Decreased edema in the left parietal lobe. No new lesions    04/04/2022 Imaging   CT chest w contrast 1. Stable radiation changes in the right perihilar region without evidence of local recurrence. 2. Interval improvement in the patchy ground-glass opacities previously demonstrated in both  lungs, most consistent with a resolving infectious or inflammatory process (including immunotherapy-related pneumonitis). 3. No evidence of metastatic disease.   05/14/2022 Miscellaneous   He had a mechanical fall and fractured his left elbow. xrays showed a left elbow fracture  05/26/22 seen by orthopedic surgeon, cast applied.    05/27/2022 Imaging   MRI brain w wo contrast 1.  All three known brain metastases have enlarged since August, with increasing regional edema. And there is a small new left anterior cerebellar 4-5 mm metastasis (was questionably punctate on the May MRI). This constellation could reflect a combination of True Progression (Left Cerebellar lesion) and pseudoprogression/radionecrosis (3 existing mets).    2. No significant intracranial mass effect. 3. Underlying moderate cerebral white matter signal changes.   05/30/2022 Imaging   CT chest abdomen pelvis w contrast  1. 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 There is a stable small right pleural effusion, but no definitive findings to suggest residual or recurrent disease on today's examination. 2. No signs of metastatic disease in the abdomen or pelvis.  3. Aortic atherosclerosis, in addition to two-vessel coronary artery disease. Please note that although the presence of coronary artery calcium documents the presence of coronary artery disease, the severity of this disease and any potential stenosis cannot be assessed on this non-gated CT examination. Assessment for potential risk factor modification, dietary therapy or pharmacologic therapy may be warranted, if clinically indicated. 4. Hepatic steatosis. 5. Colonic diverticulosis without evidence of acute diverticulitis at this time. 6. Incompletely imaged but apparently occluded vascular graft in the superior aspect of the right anterior thigh. Referral to vascular specialist for further clinical evaluation should be considered if clinically appropriate. 7. Additional incidental findings, as above.   07/22/2022 Imaging   MRI brain  1. No new or enlarging lesions. No intracranial disease progression. 2. Decreased size of treated lesions of the left frontoparietal junction and left postcentral gyrus. 3. Unchanged lesions in  the left cerebellum and right occipital lobe.    Today he feels well.Intermittent headache, is better. denies any focal deficits. chronic SOB,not worse.  no chest pain wheezing, cough.  Finished Dexamethasone tapering course.    Review of Systems  Constitutional:  Positive for fatigue. Negative for appetite change, chills, fever and unexpected weight change.  HENT:   Negative for hearing loss and voice change.   Eyes:  Negative for eye problems and icterus.  Respiratory:  Negative for chest tightness, cough and shortness of breath.   Cardiovascular:  Negative for chest pain and leg swelling.  Gastrointestinal:  Negative for abdominal distention and abdominal pain.  Endocrine: Negative for hot flashes.  Genitourinary:  Negative for difficulty urinating, dysuria and frequency.   Musculoskeletal:  Negative for arthralgias.  Skin:  Negative for itching and rash.  Neurological:  Positive for extremity weakness. Negative for headaches, light-headedness, numbness and speech difficulty.  Hematological:  Negative for adenopathy. Does not bruise/bleed easily.  Psychiatric/Behavioral:  Negative for confusion.       MEDICAL HISTORY:  Past Medical History:  Diagnosis Date   Allergy    Cancer ([place])    lung   COPD (chronic obstructive pulmonary disease) (HCC)    Dyspnea    former smoker. only doe   History of kidney stones    Hypertension    Peripheral vascular disease ([place])    Pre-diabetes    Sleep apnea     SURGICAL HISTORY: Past Surgical  History:  Procedure Laterality Date   ANTERIOR CERVICAL DECOMP/DISCECTOMY FUSION N/A 03/14/2020   Procedure: ANTERIOR CERVICAL DECOMPRESSION/DISCECTOMY FUSION 3 LEVELS C4-7;  Surgeon: Meade Maw, MD;  Location: [facility] ORS;  Service: Neurosurgery;  Laterality: N/A;   BACK SURGERY  2011   CENTRAL LINE INSERTION Right 09/10/2016   Procedure: CENTRAL LINE INSERTION;  Surgeon: Katha Cabal, MD;  Location: [facility] ORS;  Service: Vascular;   Laterality: Right;   CERVICAL FUSION     C4-c7 on Mar 14, 2020   COLONOSCOPY  2013 ?   COLONOSCOPY N/A 09/09/2021   Procedure: COLONOSCOPY;  Surgeon: [name], DO;  Location: [facility] ENDOSCOPY;  Service: Gastroenterology;  Laterality: N/A;   CORONARY ANGIOPLASTY     ESOPHAGOGASTRODUODENOSCOPY N/A 09/09/2021   Procedure: ESOPHAGOGASTRODUODENOSCOPY (EGD);  Surgeon: [name], DO;  Location: [facility] ENDOSCOPY;  Service: Gastroenterology;  Laterality: N/A;   FEMORAL-POPLITEAL BYPASS GRAFT Right 09/10/2016   Procedure: BYPASS GRAFT FEMORAL-POPLITEAL ARTERY ( BELOW KNEE );  Surgeon: Katha Cabal, MD;  Location: [facility] ORS;  Service: Vascular;  Laterality: Right;   FEMORAL-TIBIAL BYPASS GRAFT Right 03/23/2020   Procedure: BYPASS GRAFT RIGHT FEMORAL- DISTAL TIBIAL ARTERY WITH DISTAL FLOW GRAFT;  Surgeon: Katha Cabal, MD;  Location: [facility] ORS;  Service: Vascular;  Laterality: Right;   LEFT HEART CATH AND CORONARY ANGIOGRAPHY Left 05/16/2021   Procedure: LEFT HEART CATH AND CORONARY ANGIOGRAPHY;  Surgeon: Andrez Grime, MD;  Location: [place] CV LAB;  Service: Cardiovascular;  Laterality: Left;   LOWER EXTREMITY ANGIOGRAPHY Right 11/18/2016   Procedure: Lower Extremity Angiography;  Surgeon: Katha Cabal, MD;  Location: [place] CV LAB;  Service: Cardiovascular;  Laterality: Right;   LOWER EXTREMITY ANGIOGRAPHY Right 12/09/2016   Procedure: Lower Extremity Angiography;  Surgeon: Katha Cabal, MD;  Location: [place] CV LAB;  Service: Cardiovascular;  Laterality: Right;   LOWER EXTREMITY ANGIOGRAPHY Right 11/15/2019   Procedure: LOWER EXTREMITY ANGIOGRAPHY;  Surgeon: Katha Cabal, MD;  Location: [place] CV LAB;  Service: Cardiovascular;  Laterality: Right;   LOWER EXTREMITY ANGIOGRAPHY Right 12/01/2019   Procedure: Lower Extremity Angiography;  Surgeon: Algernon Huxley, MD;  Location: [place] CV LAB;  Service: Cardiovascular;  Laterality:  Right;   LOWER EXTREMITY ANGIOGRAPHY Right 12/02/2019   Procedure: LOWER EXTREMITY ANGIOGRAPHY;  Surgeon: Katha Cabal, MD;  Location: [place] CV LAB;  Service: Cardiovascular;  Laterality: Right;   LOWER EXTREMITY ANGIOGRAPHY Right 03/23/2020   Procedure: Lower Extremity Angiography;  Surgeon: Katha Cabal, MD;  Location: [place] CV LAB;  Service: Cardiovascular;  Laterality: Right;   LOWER EXTREMITY ANGIOGRAPHY Right 09/25/2021   Procedure: Lower Extremity Angiography;  Surgeon: Katha Cabal, MD;  Location: [place] CV LAB;  Service: Cardiovascular;  Laterality: Right;   LOWER EXTREMITY ANGIOGRAPHY Right 09/26/2021   Procedure: Lower Extremity Angiography;  Surgeon: Algernon Huxley, MD;  Location: [place] CV LAB;  Service: Cardiovascular;  Laterality: Right;   LOWER EXTREMITY ANGIOGRAPHY Right 04/08/2022   Procedure: Lower Extremity Angiography;  Surgeon: Katha Cabal, MD;  Location: [place] CV LAB;  Service: Cardiovascular;  Laterality: Right;   LOWER EXTREMITY INTERVENTION  11/18/2016   Procedure: Lower Extremity Intervention;  Surgeon: Katha Cabal, MD;  Location: [place] CV LAB;  Service: Cardiovascular;;   PERIPHERAL VASCULAR CATHETERIZATION Right 01/02/2015   Procedure: Lower Extremity Angiography;  Surgeon: Katha Cabal, MD;  Location: [place] CV LAB;  Service: Cardiovascular;  Laterality: Right;   PERIPHERAL  VASCULAR CATHETERIZATION Right 06/18/2015   Procedure: Lower Extremity Angiography;  Surgeon: Algernon Huxley, MD;  Location: [place] CV LAB;  Service: Cardiovascular;  Laterality: Right;   PERIPHERAL VASCULAR CATHETERIZATION  06/18/2015   Procedure: Lower Extremity Intervention;  Surgeon: Algernon Huxley, MD;  Location: [place] CV LAB;  Service: Cardiovascular;;   PERIPHERAL VASCULAR CATHETERIZATION N/A 10/09/2015   Procedure: Abdominal Aortogram w/Lower Extremity;  Surgeon: Katha Cabal, MD;  Location: [place] CV LAB;  Service: Cardiovascular;  Laterality: N/A;   PERIPHERAL VASCULAR CATHETERIZATION  10/09/2015   Procedure: Lower Extremity Intervention;  Surgeon: Katha Cabal, MD;  Location: [place] CV LAB;  Service: Cardiovascular;;   PERIPHERAL VASCULAR CATHETERIZATION Right 10/10/2015   Procedure: Lower Extremity Angiography;  Surgeon: Algernon Huxley, MD;  Location: [place] CV LAB;  Service: Cardiovascular;  Laterality: Right;   PERIPHERAL VASCULAR CATHETERIZATION Right 10/10/2015   Procedure: Lower Extremity Intervention;  Surgeon: Algernon Huxley, MD;  Location: [place] CV LAB;  Service: Cardiovascular;  Laterality: Right;   PERIPHERAL VASCULAR CATHETERIZATION Right 12/03/2015   Procedure: Lower Extremity Angiography;  Surgeon: Algernon Huxley, MD;  Location: [place] CV LAB;  Service: Cardiovascular;  Laterality: Right;   PERIPHERAL VASCULAR CATHETERIZATION Right 12/04/2015   Procedure: Lower Extremity Angiography;  Surgeon: Algernon Huxley, MD;  Location: [place] [facility] LAB;  Service: Cardiovascular;  Laterality: Right;   PERIPHERAL VASCULAR CATHETERIZATION  12/04/2015   Procedure: Lower Extremity Intervention;  Surgeon: Algernon Huxley, MD;  Location: [place] CV LAB;  Service: Cardiovascular;;   PERIPHERAL VASCULAR CATHETERIZATION Right 06/30/2016   Procedure: Lower Extremity Angiography;  Surgeon: Algernon Huxley, MD;  Location: [place] [facility] LAB;  Service: Cardiovascular;  Laterality: Right;   PERIPHERAL VASCULAR CATHETERIZATION Right 06/25/2016   Procedure: Lower Extremity Angiography;  Surgeon: Katha Cabal, MD;  Location: [place] [facility] LAB;  Service: Cardiovascular;  Laterality: Right;   PERIPHERAL VASCULAR CATHETERIZATION Right 07/01/2016   Procedure: Lower Extremity Angiography;  Surgeon: Katha Cabal, MD;  Location: [place] CV LAB;  Service: Cardiovascular;  Laterality: Right;   PERIPHERAL VASCULAR CATHETERIZATION  07/01/2016   Procedure: Lower  Extremity Intervention;  Surgeon: Katha Cabal, MD;  Location: [place] CV LAB;  Service: Cardiovascular;;   PERIPHERAL VASCULAR CATHETERIZATION Right 08/01/2016   Procedure: Lower Extremity Angiography;  Surgeon: Katha Cabal, MD;  Location: [place] CV LAB;  Service: Cardiovascular;  Laterality: Right;   PORTA CATH INSERTION N/A 10/05/2020   Procedure: PORTA CATH INSERTION;  Surgeon: Katha Cabal, MD;  Location: [place] [facility] LAB;  Service: Cardiovascular;  Laterality: N/A;   stent placement in right leg Right    VIDEO BRONCHOSCOPY N/A 09/14/2020   Procedure: VIDEO BRONCHOSCOPY WITH FLUORO;  Surgeon: Tyler Pita, MD;  Location: [facility] ORS;  Service: Cardiopulmonary;  Laterality: N/A;   VIDEO BRONCHOSCOPY WITH ENDOBRONCHIAL ULTRASOUND N/A 09/14/2020   Procedure: VIDEO BRONCHOSCOPY WITH ENDOBRONCHIAL ULTRASOUND;  Surgeon: Tyler Pita, MD;  Location: [facility] ORS;  Service: Cardiopulmonary;  Laterality: N/A;    SOCIAL HISTORY: Social History   Socioeconomic History   Marital status: Single    Spouse name: Not on file   Number of children: Not on file   Years of education: Not on file   Highest education level: Not on file  Occupational History   Occupation: unemployed  Tobacco Use   Smoking status: Former    Packs/day: 2.00    Years: 38.00  Total pack years: 76.00    Types: Cigarettes    Quit date: 12/20/2014    Years since quitting: 7.7   Smokeless tobacco: Never   Tobacco comments:    quit smoking in 2017 most smoked 2   Vaping Use   Vaping Use: Former  Substance and Sexual Activity   Alcohol use: Not Currently   Drug use: No   Sexual activity: Yes  Other Topics Concern   Not on file  Social History Narrative   Not on file   Social Determinants of Health   Financial Resource Strain: Not on file  Food Insecurity: Not on file  Transportation Needs: Not on file  Physical Activity: Not on file  Stress: Not on file  Social Connections:  Not on file  Intimate Partner Violence: Not on file    FAMILY HISTORY: Family History  Problem Relation Age of Onset   Diabetes Mother    Hypertension Mother    Heart murmur Mother    Leukemia Mother    Throat cancer Maternal Grandmother    Colon cancer Neg Hx    Breast cancer Neg Hx     ALLERGIES:  has No Known Allergies.  MEDICATIONS:  Current Outpatient Medications  Medication Sig Dispense Refill   aspirin EC 81 MG tablet Take 81 mg by mouth daily. Swallow whole.     BEVESPI AEROSPHERE 9-4.8 MCG/ACT AERO Inhale into the lungs.     clopidogrel (PLAVIX) 75 MG tablet TAKE 1 TABLET BY MOUTH DAILY 100 tablet 2   levothyroxine (SYNTHROID) 100 MCG tablet Take 1 tablet (100 mcg total) by mouth daily before breakfast. 30 tablet 0   simvastatin (ZOCOR) 40 MG tablet Take 40 mg by mouth at bedtime.     No current facility-administered medications for this visit.     PHYSICAL EXAMINATION: ECOG PERFORMANCE STATUS: 1 - Symptomatic but completely ambulatory Vitals:   09/26/22 0857  BP: 138/77  Pulse: (!) 108  Temp: (!) 97.4 F (36.3 C)  SpO2: 100%   Filed Weights   09/26/22 0857  Weight: 218 lb 6.4 oz (99.1 kg)    Physical Exam Constitutional:      General: He is not in acute distress.    Appearance: He is not diaphoretic.     Comments: Patient walks with a cane  HENT:     Head: Normocephalic and atraumatic.     Nose: Nose normal.     Mouth/Throat:     Pharynx: No oropharyngeal exudate.  Eyes:     General: No scleral icterus.    Pupils: Pupils are equal, round, and reactive to light.  Cardiovascular:     Rate and Rhythm: Normal rate and regular rhythm.     Heart sounds: No murmur heard. Pulmonary:     Effort: Pulmonary effort is normal. No respiratory distress.     Breath sounds: No rales.  Chest:     Chest wall: No tenderness.  Abdominal:     General: There is no distension.     Palpations: Abdomen is soft.     Tenderness: There is no abdominal tenderness.   Musculoskeletal:     Cervical back: Normal range of motion and neck supple.  Skin:    General: Skin is warm and dry.     Findings: No erythema.  Neurological:     Mental Status: He is alert and oriented to person, place, and time.     Cranial Nerves: No cranial nerve deficit.     Motor: No abnormal  muscle tone.     Coordination: Coordination normal.  Psychiatric:        Mood and Affect: Affect normal.      LABORATORY DATA:  I have reviewed the data as listed    Latest Ref Rng & Units 08/29/2022   10:08 AM 08/15/2022    8:47 AM 08/01/2022    8:49 AM  CBC  WBC 4.0 - 10.5 K/uL 3.9  4.6  6.7   Hemoglobin 13.0 - 17.0 g/dL 12.7  12.6  13.2   Hematocrit 39.0 - 52.0 % 38.3  38.5  40.1   Platelets 150 - 400 K/uL 224  163  182       Latest Ref Rng & Units 08/29/2022   10:08 AM 08/15/2022    8:47 AM 08/01/2022    8:49 AM  CMP  Glucose 70 - 99 mg/dL 196  106  111   BUN 8 - 23 mg/dL '15  21  26   '$ Creatinine 0.61 - 1.24 mg/dL 1.13  1.03  1.15   Sodium 135 - 145 mmol/L 138  140  139   Potassium 3.5 - 5.1 mmol/L 3.6  3.8  3.6   Chloride 98 - 111 mmol/L 102  105  105   CO2 22 - 32 mmol/L '25  25  25   '$ Calcium 8.9 - 10.3 mg/dL 8.9  9.2  8.6   Total Protein 6.5 - 8.1 g/dL 6.9  6.7  6.4   Total Bilirubin 0.3 - 1.2 mg/dL 0.4  0.3  0.2   Alkaline Phos 38 - 126 U/L 72  54  52   AST 15 - 41 U/L '29  27  29   '$ ALT 0 - 44 U/L 26  38  50      RADIOGRAPHIC STUDIES: I have personally reviewed the radiological images as listed and agreed with the findings in the report. VAS [place] ABI WITH/WO TBI  Result Date: 09/10/2022  LOWER EXTREMITY DOPPLER STUDY Patient Name:  Joshua [name]  Date of Exam:   09/05/2022 Medical Rec #: [id number]            Accession #:    [id number] Date of Birth: 03/05/1956           Patient Gender: M Patient Age:   34 years Exam Location:  [facility] Procedure:      VAS [place] ABI WITH/WO TBI Referring Phys:  --------------------------------------------------------------------------------  Indications: Claudication, peripheral artery disease, and Routine f/u. High Risk Factors: Hyperlipidemia. Other Factors: ASO s/p multiple interventions and bypass.                11/15/2019 Right thrombectomy of Fempop insitu graft with added                stent throughout.                 12/01/2019: Aortogram and Selctive Right Lower Extremity                Angiogram. 6 mg of TPA delivered in a lysis catheterfrom the                Origin of the Right Femoral -Popliteal bypass down the proximal                ATA with placement of the lysis.                 12/02/2019: PTA of the Right Posterior Tibial Artery. PTA of  the                Right Peroneal Artery. Mechanical Thrombectomy of the Right                Femoral -Popliteal Bypass graft. Mechanical Thrombectomy of the                Right Tibioperoneal Trunk and Posterior Tibial Artery.                 09/25/2021: Right Lower Extremity Angiography third order                catheter placement. PTA of the proximal peroneal and                tibioperoneal trunk to 2.5 mm with an ultra score balloon. PTA of                the Femoral Artery and the proximal anstomosis of the bypass                graft to 4 mm with an ultra score balloon. Administration of 5 mg                of tpa throughout the entire bypass and in the peroneal Artery.                Initiation of continuous tpa infusion Right lower Extremity for                limb salvage. Insertion of a triple lumen catheter Left CFV with                [place] and fluoroscopic guidance.                 09/26/2021: Right LE Angiogram. PTA of the Right Posterior Tibial                Artery with 2.5 mm diameter by 30 cm length angioplasty balloon.                PTA of the distal bypass anastomosis and distal bypass graft                analogous to the Popliteal artery with 4 mm diameter by 8 cm                length Lutonix drug  coated angioplasty balloon.                04/08/2022 right thrombectomy of the Deep profunda.  Vascular Interventions: Hx of multiple right lower extremity PTA's/stents.                         09/10/2016 Right femoral to below the knee popliteal                         artery bypass graft placement. 5/01 Right bypass graft &                         mid popliteal artery PTA & coil embolectomy of large                         branch of bypass graft. 12/09/16 Right distal bypass  graft PTA/stent and right posterior tibial artery PTA.                         03/23/2020 Rt PTA thrombectomy. Rt Profunda fem artery to                         PTA bypass. Comparison Study: 04/2022 Performing Technologist: Concha Norway RVT  Examination Guidelines: A complete evaluation includes at minimum, Doppler waveform signals and systolic blood pressure reading at the level of bilateral brachial, anterior tibial, and posterior tibial arteries, when vessel segments are accessible. Bilateral testing is considered an integral part of a complete examination. Photoelectric Plethysmograph (PPG) waveforms and toe systolic pressure readings are included as required and additional duplex testing as needed. Limited examinations for reoccurring indications may be performed as noted.  ABI Findings: +---------+------------------+-----+--------+--------+ Right    Rt Pressure (mmHg)IndexWaveformComment  +---------+------------------+-----+--------+--------+ Brachial 150                                     +---------+------------------+-----+--------+--------+ ATA                             absent           +---------+------------------+-----+--------+--------+ PTA      94                0.63 biphasic         +---------+------------------+-----+--------+--------+ Great Toe46                0.31 Abnormal         +---------+------------------+-----+--------+--------+  +---------+------------------+-----+---------+-------+ Left     Lt Pressure (mmHg)IndexWaveform Comment +---------+------------------+-----+---------+-------+ Brachial 150                                     +---------+------------------+-----+---------+-------+ ATA      166               1.11 triphasic        +---------+------------------+-----+---------+-------+ PTA      154               1.03 triphasic        +---------+------------------+-----+---------+-------+ Great Toe136               0.91 Normal           +---------+------------------+-----+---------+-------+ +-------+-----------+-----------+------------+------------+ ABI/TBIToday's ABIToday's TBIPrevious ABIPrevious TBI +-------+-----------+-----------+------------+------------+ Right  .63        .31        .65         .21          +-------+-----------+-----------+------------+------------+ Left   1.11       .91        1.19        .96          +-------+-----------+-----------+------------+------------+ Right ABIs and TBIs appear essentially unchanged compared to prior study on 04/2022.  Summary: Right: Resting right ankle-brachial index indicates moderate right lower extremity arterial disease. The right toe-brachial index is abnormal. Left: Resting left ankle-brachial index is within normal range. The left toe-brachial index is normal. *See table(s) above for measurements and observations.  Electronically signed by [name] MD on 09/10/2022 at 3:42:51 PM.    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 Chronic shortness of breath. * Tracking Code: BO * EXAM: CT CHEST, ABDOMEN, AND PELVIS WITH CONTRAST TECHNIQUE: Multidetector CT imaging of the chest, abdomen and pelvis was performed following the standard protocol during bolus administration of intravenous contrast.  RADIATION DOSE REDUCTION: This exam was performed according to the departmental dose-optimization program which includes automated exposure control, adjustment of the mA and/or kV according to patient size and/or use of iterative reconstruction technique. CONTRAST:  162m OMNIPAQUE IOHEXOL 300 MG/ML  SOLN COMPARISON:  05/28/2022. FINDINGS: CT CHEST FINDINGS Cardiovascular: Right IJ Port-A-Cath terminates in the right atrium. Atherosclerotic calcification of the aorta and coronary arteries. Heart size normal. Left ventricle appears hypertrophied with apical thinning. No pericardial effusion. Mediastinum/Nodes: No pathologically enlarged mediastinal lymph nodes. Right hilar soft tissue thickening, as before. No hilar or axillary adenopathy. Esophagus is grossly unremarkable. Lungs/Pleura: New ill-defined centrilobular nodularity bilaterally, including along the fissures. Perihilar post radiation pulmonary parenchymal retraction. Trace right pleural fluid. Debris is seen in the airway. Musculoskeletal: Degenerative changes in the spine. Old left rib fractures. No worrisome lytic or sclerotic lesions. CT ABDOMEN PELVIS FINDINGS Hepatobiliary: Liver is decreased in attenuation diffusely. Liver and gallbladder are otherwise unremarkable. No biliary ductal dilatation. Pancreas: Negative. Spleen: Negative. Adrenals/Urinary Tract: Adrenal glands and kidneys are unremarkable. Ureters are decompressed. Bladder is low in volume. Stomach/Bowel: Stomach, small bowel, appendix and colon are unremarkable. Vascular/Lymphatic: Atherosclerotic calcification of the aorta. No pathologically enlarged lymph nodes. Bifurcating right femoral arterial graft with associated occlusion, as before. Reproductive: Prostate is visualized. Other: No free fluid.  Mesenteries and peritoneum are unremarkable. Musculoskeletal: Degenerative changes in the spine. Postoperative changes in the lumbar spine. No worrisome lytic or sclerotic lesions.  IMPRESSION: 1. New diffuse Peri lymphatic ill-defined nodularity can be seen in the setting of lymphocytic interstitial pneumonia. Although interlobular septal thickening is not an overwhelming feature, lymphangitic carcinomatosis cannot be definitively excluded. Etiologies such as hypersensitivity pneumonitis and smoking related respiratory bronchiolitis are considered less likely given the distribution and patient reportedly quit smoking in 2016. 2. Post radiation changes in the right lung. No evidence of distant metastatic disease. 3. Trace right pleural fluid. 4. Hepatic steatosis. 5. Aortic atherosclerosis (ICD10-I70.0). Coronary artery calcification. Chronically occluded right femoral arterial graft. Electronically Signed   By: MLorin PicketM.D.   On: 08/26/2022 10:55   MR BRAIN W WO CONTRAST  Result Date: 07/22/2022 CLINICAL DATA:  Lung carcinoma, treated metastatic disease. Stereotactic radio surgery. EXAM: MRI HEAD WITHOUT AND WITH CONTRAST TECHNIQUE: Multiplanar, multiecho pulse sequences of the brain and surrounding structures were obtained without and with intravenous contrast. CONTRAST:  913mGADAVIST GADOBUTROL 1 MMOL/ML IV SOLN COMPARISON:  05/23/2022 brain MRI FINDINGS: Brain: No acute infarct or acute hemorrhage. No hydrocephalus or midline shift. There is multifocal hyperintense T2-weighted signal within the periventricular and deep white matter. There are no new or larger lesions. There are 4 lesions that are unchanged or smaller: 1. Left cerebellum, 2 mm, unchanged.  Series 1,023 image 162 2. Right occipital lobe, 8 mm, unchanged, image 137 3. Left frontoparietal junction, 20 mm pelvis slightly smaller. Markedly decreased contrast enhancement and decreased surrounding hyperintense T2-weighted signal. Image 93 4. Left postcentral gyrus, 7 mm, smaller, image 52 Vascular: Normal flow voids. Skull and upper cervical spine: Normal bone marrow signal. Sinuses/Orbits: Normal orbits. Paranasal  sinuses and mastoids are normal. Other: None IMPRESSION: 1. No  new or enlarging lesions. No intracranial disease progression. 2. Decreased size of treated lesions of the left frontoparietal junction and left postcentral gyrus. 3. Unchanged lesions in the left cerebellum and right occipital lobe. Electronically Signed   By: Ulyses Jarred M.D.   On: 07/22/2022 23:04

## 2022-09-27 LAB — ANA COMPREHENSIVE PANEL

## 2022-09-28 LAB — RHEUMATOID FACTOR: Rheumatoid fact SerPl-aCnc: <10 IU/mL (ref <14.0)

## 2022-09-30 LAB — IMMUNOGLOBULINS A/E/G/M, SERUM
IgA: 177 mg/dL (ref 61–437)
IgE (Immunoglobulin E), Serum: 10 IU/mL (ref 6–495)
IgG (Immunoglobin G), Serum: 538 mg/dL — ABNORMAL LOW (ref 603–1613)
IgM (Immunoglobulin M), Srm: 32 mg/dL (ref 20–172)

## 2022-10-10 ENCOUNTER — Other Ambulatory Visit: Payer: Medicare Other

## 2022-10-10 ENCOUNTER — Ambulatory Visit: Payer: Medicare Other

## 2022-10-10 ENCOUNTER — Ambulatory Visit: Payer: Medicare Other | Admitting: Oncology

## 2022-10-14 ENCOUNTER — Ambulatory Visit (INDEPENDENT_AMBULATORY_CARE_PROVIDER_SITE_OTHER): Payer: Medicare Other

## 2022-10-14 ENCOUNTER — Ambulatory Visit
Admission: RE | Admit: 2022-10-14 | Discharge: 2022-10-14 | Disposition: A | Discharge status: Home / Self Care | Payer: Medicare Other | Source: Ambulatory Visit | Attending: Internal Medicine | Admitting: Internal Medicine

## 2022-10-14 DIAGNOSIS — C7931 Secondary malignant neoplasm of brain: Secondary | ICD-10-CM | POA: Diagnosis not present

## 2022-10-14 DIAGNOSIS — R0602 Shortness of breath: Secondary | ICD-10-CM | POA: Diagnosis not present

## 2022-10-14 LAB — PULMONARY FUNCTION TEST ARMC ONLY
DL/VA % pred: 80 %
DL/VA: 3.31 ml/min/mmHg/L
DLCO unc % pred: 62 %
DLCO unc: 17.23 ml/min/mmHg
FEF 25-75 Post: 2.61 L/sec
FEF 25-75 Pre: 2.02 L/sec
FEF2575-%Change-Post: 29 %
FEF2575-%Pred-Post: 91 %
FEF2575-%Pred-Pre: 70 %
FEV1-%Change-Post: 7 %
FEV1-%Pred-Post: 74 %
FEV1-%Pred-Pre: 69 %
FEV1-Post: 2.67 L
FEV1-Pre: 2.49 L
FEV1FVC-%Change-Post: 3 %
FEV1FVC-%Pred-Pre: 101 %
FEV6-%Change-Post: 5 %
FEV6-%Pred-Post: 74 %
FEV6-%Pred-Pre: 70 %
FEV6-Post: 3.41 L
FEV6-Pre: 3.23 L
FEV6FVC-%Change-Post: 0 %
FEV6FVC-%Pred-Post: 104 %
FEV6FVC-%Pred-Pre: 104 %
FVC-%Change-Post: 4 %
FVC-%Pred-Post: 71 %
FVC-%Pred-Pre: 68 %
FVC-Post: 3.42 L
FVC-Pre: 3.29 L
Post FEV1/FVC ratio: 78 %
Post FEV6/FVC ratio: 100 %
Pre FEV1/FVC ratio: 76 %
Pre FEV6/FVC Ratio: 99 %
RV % pred: 70 %
RV: 1.69 L
TLC % pred: 66 %
TLC: 4.79 L

## 2022-10-14 MED ORDER — GADOBUTROL 1 MMOL/ML IV SOLN
9.0000 mL | Freq: Once | INTRAVENOUS | Status: AC | PRN
  Administered 2022-10-14: 9 mL via INTRAVENOUS

## 2022-10-14 MED ORDER — ALBUTEROL SULFATE (2.5 MG/3ML) 0.083% IN NEBU: 2.5000 mg | INHALATION_SOLUTION | Freq: Once | RESPIRATORY_TRACT | Status: AC

## 2022-10-20 ENCOUNTER — Inpatient Hospital Stay: Payer: Medicare Other

## 2022-10-23 ENCOUNTER — Ambulatory Visit (INDEPENDENT_AMBULATORY_CARE_PROVIDER_SITE_OTHER): Payer: Medicare Other | Admitting: Pulmonary Disease

## 2022-10-23 ENCOUNTER — Other Ambulatory Visit
Admission: RE | Admit: 2022-10-23 | Discharge: 2022-10-23 | Disposition: A | Discharge status: Home / Self Care | Payer: Medicare Other | Source: Ambulatory Visit | Attending: Pulmonary Disease | Admitting: Pulmonary Disease

## 2022-10-23 ENCOUNTER — Encounter: Payer: Self-pay | Admitting: Pulmonary Disease

## 2022-10-23 VITALS — BP 128/78 | HR 98 | Temp 97.1°F | Ht 71.0 in | Wt 220.0 lb

## 2022-10-23 DIAGNOSIS — R930 Abnormal findings on diagnostic imaging of skull and head, not elsewhere classified: Secondary | ICD-10-CM | POA: Insufficient documentation

## 2022-10-23 DIAGNOSIS — R937 Abnormal findings on diagnostic imaging of other parts of musculoskeletal system: Secondary | ICD-10-CM | POA: Insufficient documentation

## 2022-10-23 DIAGNOSIS — R0602 Shortness of breath: Secondary | ICD-10-CM | POA: Diagnosis not present

## 2022-10-23 DIAGNOSIS — J301 Allergic rhinitis due to pollen: Secondary | ICD-10-CM

## 2022-10-23 DIAGNOSIS — J439 Emphysema, unspecified: Secondary | ICD-10-CM

## 2022-10-23 DIAGNOSIS — J4489 Other specified chronic obstructive pulmonary disease: Secondary | ICD-10-CM

## 2022-10-23 DIAGNOSIS — J691 Pneumonitis due to inhalation of oils and essences: Secondary | ICD-10-CM

## 2022-10-23 DIAGNOSIS — C3491 Malignant neoplasm of unspecified part of right bronchus or lung: Secondary | ICD-10-CM

## 2022-10-23 MED ORDER — FLUTICASONE PROPIONATE 50 MCG/ACT NA SUSP: 1.0000 | Freq: Two times a day (BID) | NASAL | 2 refills | Status: DC

## 2022-10-23 NOTE — Progress Notes (Signed)
Subjective:    Patient ID: Joshua [name], male    DOB: Dec 23, 1955, 67 y.o.   MRN: [id number] Patient Care Team: [name], MD as PCP - General (Physician Assistant) [name], MD as Referring Physician (Physician Assistant) Bary Castilla, [name], MD (General Surgery) Telford Nab, RN as Oncology Nurse Navigator [name], MD as Consulting Physician (Oncology)  Chief Complaint  Patient presents with   Follow-up    SOB with exertion. Occasional wheezing. No cough. Sinus drainage.   HPI Patient is a 67 year old former smoker (49 PY) with a history of COPD, shortness of breath on exertion and squamous cell carcinoma of the right upper lobe, stage IV disease.  The patient received concomitant chemoradiation followed by durvalumab maintenance as he is PD-L1 positive.  He had a break on durvalumab therapy due to groundglass opacities noted on CT chest that were consistent with check point inhibitor pneumonitis.  A follow-up CT chest performed on 04 April 2022 showed marked improvement on the groundglass opacities.  The patient then restarted Durvalumab.  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  There are postradiation changes in the right lung with no evidence of distant metastatic disease.  He has not had any symptoms related to this.  His Durvalumab was then discontinued on 9 February after the CT results were known.  His disease was initially staged as a III a however, in March 2023 he was noted to have 2 enhancing lesions in the brain and is now staged at a IV.  He received radiation to the brain.   Has not had any fevers, chills or sweats since his last visit.  He had a MRI of the brain that has shown multiple intraparenchymal lesions that demonstrate ring enhancement type pattern some have surrounding vasogenic edema all of these have  worsened.  There is a suggestion that these may represent radiation necrosis however this is not conclusive. He is on Eliquis and Plavix for his severe peripheral artery disease status post thrombosis and subsequent thrombectomy of the right lower extremity.   He discontinued all inhalers as he did not feel that these were helping him.  He has not had any cough or sputum production.  No hemoptysis.  He has had increase nasal congestion and nasal trends.  No epistaxis.  He does not endorse any other symptomatology today.  He had pulmonary function testing performed on 14 October 2022 and this shows combined restriction/obstruction, no bronchodilator response and moderate diffusion defect.  Overall unchanged from prior.  DATA 07/18/2021 PFTs: FEV1 2.80 L or 76% predicted, FVC 3.61 L or 74% predicted, there is a small airways component. No significant bronchodilator response.  Lung volumes mildly reduced.  Diffusion capacity 71% by Kco. 10/14/2022 PFTs: FEV1 2.49 L or 69% predicted, FVC 3.29 L or 68% predicted, FEV1/FVC 76%, lung volumes are moderately reduced.  Diffusion capacity moderately reduced.  There is a minimal/mild obstructive component that may be underestimated due to the degree of restriction.  Overall mild decline in lung function compared to 2022 study.  Review of Systems A 10 point review of systems was performed and it is as noted above otherwise negative.  Patient Active Problem List   Diagnosis Date Noted   Supracondylar fracture of left humerus 05/23/2022   GERD (gastroesophageal reflux disease) 01/25/2022   Pneumonitis 01/25/2022   COPD (chronic obstructive pulmonary disease)  12/28/2021   Hypocalcemia 12/28/2021   Malignant neoplasm metastatic to brain 10/08/2021   Hypothyroidism, unspecified 10/08/2021   Parotid nodule 08/16/2021   Dysphagia 06/20/2021   COVID 06/20/2021   Organizing pneumonia 04/04/2021   Hypothyroidism due to medication 04/04/2021   Encounter for  antineoplastic immunotherapy 04/04/2021   Antineoplastic chemotherapy induced anemia 10/17/2020   Squamous cell carcinoma of right lung 09/20/2020   Goals of care, counseling/discussion 08/30/2020   Iron deficiency anemia 08/30/2020   Ischemia of extremity 03/23/2020   Cervical myelopathy 03/14/2020   Ischemia of right lower extremity 11/15/2019   Peripheral neuropathy 10/14/2019   Chronic left-sided low back pain without sciatica 04/13/2019   History of back surgery 04/13/2019   Atherosclerosis of lower extremity with claudication [id number]   Complication associated with vascular device 11/13/2016   Hyperlipidemia, unspecified 06/18/2016   Radiculopathy of lumbar region 06/10/2016   PVD (peripheral vascular disease) 05/08/2016   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 Swallow whole.   clopidogrel (PLAVIX) 75 MG tablet TAKE 1 TABLET BY MOUTH DAILY   fluticasone (FLONASE) 50 MCG/ACT nasal spray Place 1 spray into both nostrils 2 (two) times daily.   levothyroxine (SYNTHROID) 100 MCG tablet Take 1 tablet (100 mcg total) by mouth daily before breakfast.   simvastatin (ZOCOR) 40 MG tablet Take 40 mg by mouth at bedtime.   Immunization History  Administered Date(s) Administered   Influenza,inj,Quad PF,6+ Mos 09/14/2016, 04/13/2019, 04/16/2020   Influenza-Unspecified 09/14/2016, 04/13/2019, 04/16/2020, 04/02/2022   PFIZER Comirnaty(Gray Top)Covid-19 Tri-Sucrose  Vaccine 10/20/2019, 11/29/2019   PFIZER(Purple Top)SARS-COV-2 Vaccination 10/20/2019, 11/29/2019   Tdap 04/16/2020       Objective:   Physical Exam Blood pressure 128/78, pulse 98, temperature (!) 97.1 F (36.2 C), height 5\' 11"  (1.803 m), weight 220 lb (99.8 kg), SpO2 98 %.  SpO2: 98 % O2 Device: None (Room air)  GENERAL: Chronically ill-appearing well-developed well-nourished gentleman, no tachypnea.  No conversational dyspnea.  Ambulatory with assistance of cane.   HEAD: Normocephalic, atraumatic.  EYES: Pupils equal, round, reactive to light.  No scleral icterus.  MOUTH: Teeth intact, caps noted.  Oral mucosa moist, no thrush. NECK: Supple.  Limited ROM due to prior spinal fusion.  No thyromegaly. Trachea midline. No JVD.  No adenopathy. PULMONARY: Good air entry bilaterally.  Few rhonchi, few end expiratory wheezes. CARDIOVASCULAR: S1 and S2. Regular rate and rhythm.  No rubs, murmurs or gallops heard. ABDOMEN: Benign. MUSCULOSKELETAL: No joint deformity, no clubbing, no edema.  NEUROLOGIC: Mild psychomotor retardation noted, mild unsteady gait. SKIN: Intact,warm,dry. PSYCH: Taciturn but cooperative.   Reviewed patient's MRI brain performed 14 October 2022.  Images shown to the patient.  Reviewed patient's PFTs with the patient.    Assessment & Plan:     ICD-10-CM   1. SOB (shortness of breath)  R06.02  Multifactorial Combined restrictive/obstructive physiology Mostly restrictive No response from inhalers    2. COPD with chronic bronchitis and emphysema  J44.89    J43.9    Patient discontinued Bevespi States does not want inhalers States inhalers do not help him    3. Seasonal allergic rhinitis due to pollen  J30.1    Flonase 1 spray to each nostril twice daily    4. Multiple lesions on computed tomography of brain and spine  R93.0 Aspergillus galactomannan antigen   R93.7 Toxoplasma antibodies- IgG and  IgM    Blastomyces Antigen   He has ring-enhancing  lesions May be secondary to radiation necrosis Cannot exclude infectious Titers ordered    5. LIP (lipoid interstitial pneumonia) ([place]) - Rule out  J69.1    Asymptomatic in this regard Connective tissue disease workup negative Immunotherapy on hold    6. Squamous cell carcinoma of right lung  C34.91    This issue adds complexity to his management Has careful follow-up with oncology     Orders Placed This Encounter  Procedures   Blastomyces Antigen    Standing Status:   Future    Number of Occurrences:   1    Standing Expiration Date:   10/23/2023   Aspergillus galactomannan antigen    Standing Status:   Future    Standing Expiration Date:   10/23/2023   Toxoplasma antibodies- IgG and  IgM    Standing Status:   Future    Number of Occurrences:   1    Standing Expiration Date:   10/23/2023   Meds ordered this encounter  Medications   fluticasone (FLONASE) 50 MCG/ACT nasal spray    Sig: Place 1 spray into both nostrils 2 (two) times daily.    Dispense:  15.8 mL    Refill:  2   Suspect that the findings that are suspicious for LIP may be related to prior Durvalumab therapy.  Patient is currently off of this infusion.  Will see the patient in follow-up in 2 to 3 months time, he is to call sooner if any new respiratory issues arise.  Renold Don, MD Advanced Bronchoscopy P[facility]    *This note was dictated using voice recognition software/Dragon.  Despite best efforts to proofread, errors can occur which can change the meaning. Any transcriptional errors that result from this process are unintentional and may not be fully corrected at the time of dictation.

## 2022-10-23 NOTE — Patient Instructions (Addendum)
We are getting some blood work drawn today.  Sent in a prescription for a nasal spray to see if this helps with your nasal congestion and postnasal drip.  We will call you with the results of your blood work.  We will see you in follow-up in 2 to 3 months time call sooner should any new problems arise.  I will see the patient in follow-up

## 2022-10-24 ENCOUNTER — Inpatient Hospital Stay: Payer: Medicare Other | Attending: Oncology | Admitting: Internal Medicine

## 2022-10-24 ENCOUNTER — Other Ambulatory Visit
Admission: RE | Admit: 2022-10-24 | Discharge: 2022-10-24 | Disposition: A | Discharge status: Home / Self Care | Payer: Medicare Other | Source: Ambulatory Visit | Attending: Pulmonary Disease | Admitting: Pulmonary Disease

## 2022-10-24 ENCOUNTER — Other Ambulatory Visit: Payer: Self-pay | Admitting: Pulmonary Disease

## 2022-10-24 ENCOUNTER — Other Ambulatory Visit: Payer: Self-pay | Admitting: Radiation Therapy

## 2022-10-24 VITALS — BP 154/80 | HR 90 | Temp 97.0°F | Resp 18 | Ht 71.0 in | Wt 222.0 lb

## 2022-10-24 DIAGNOSIS — R930 Abnormal findings on diagnostic imaging of skull and head, not elsewhere classified: Secondary | ICD-10-CM

## 2022-10-24 DIAGNOSIS — C3411 Malignant neoplasm of upper lobe, right bronchus or lung: Secondary | ICD-10-CM | POA: Diagnosis present

## 2022-10-24 DIAGNOSIS — C7931 Secondary malignant neoplasm of brain: Secondary | ICD-10-CM | POA: Insufficient documentation

## 2022-10-24 DIAGNOSIS — R937 Abnormal findings on diagnostic imaging of other parts of musculoskeletal system: Secondary | ICD-10-CM | POA: Insufficient documentation

## 2022-10-24 LAB — CRYPTOCOCCAL ANTIGEN: Crypto Ag: NEGATIVE

## 2022-10-24 LAB — TOXOPLASMA ANTIBODIES- IGG AND  IGM
Toxoplasma Antibody- IgM: 3.4 AU/mL (ref 0.0–7.9)
Toxoplasma IgG Ratio: 30.4 IU/mL — ABNORMAL HIGH (ref 0.0–7.1)

## 2022-10-24 MED ORDER — DEXAMETHASONE 2 MG PO TABS: 2.0000 mg | ORAL_TABLET | Freq: Every day | ORAL | 1 refills | Status: DC

## 2022-10-24 NOTE — Progress Notes (Signed)
Patient had a high IgG Toxoplasma titer.  Discussed with infectious diseases.  We are ordering additional testing.  Also discussed with oncology.  Gailen Shelter, MD Advanced Bronchoscopy P[facility]

## 2022-10-24 NOTE — Progress Notes (Signed)
[facility] [address]  [place], [place] [address] [phone]   Interval Evaluation  Date of Service: 10/24/22 Patient Name: Joshua [name] Patient MRN: [id number] Patient DOB: August 01, 1955 Provider: Henreitta Leber, MD  Identifying Statement:  LARWRENCE BRAZZEL is a 67 y.o. male with Malignant neoplasm metastatic to brain    Primary Cancer:  Oncologic History: Oncology History  Squamous cell carcinoma of right lung  09/18/2020 Imaging   PET scan showed right upper lobe nodule maximal SUV 11.5.  Hypermetabolic cluster right paratracheal adenopathy with maximum SUV up to 12.  No findings of metastatic disease to the neck/abdomen/pelvis or skeleton.   09/20/2020 Initial Diagnosis   Squamous cell carcinoma of right lung (HCC) NGS showed PD-L1 TPS 95%, LRP1B S911fs, PIK3CA E545K, RB1 c1422-2A>T, RIT1 T83_ A84del, STK11 P260fs, TPS53 R248W, TMB 19.67mut/mb, MS stable.    09/20/2020 Cancer Staging   Staging form: Lung, AJCC 8th Edition - Clinical stage from 09/20/2020: Stage IV (cT1b, cN2, cM1) - Signed by [name], MD on 12/13/2021   10/03/2020 Imaging   MRI brain with and without contrast showed no evidence of intracranial metastatic disease. Well-defined round lesion within the left parotid tail measuring approximately 2.3 x 1.8 x 1.8 cm   10/09/2020 - 11/27/2020 Radiation Therapy   concurrent chemoradiation   10/10/2020 - 10/31/2020 Chemotherapy   10/10/2020 concurrent chemoradiation.  carboplatin AUC of 2 and Taxol 45 mg/m2 x 4.  Additional chemotherapy was held due to thrombocytopenia, neutropenia       12/31/2020 - 03/21/2022 Chemotherapy   Maintenance  Durvalumab q14d      12/31/2020 -  Chemotherapy   Patient is on Treatment Plan : LUNG Durvalumab (10) q14d     03/28/2021 Imaging   CT without contrast showed new linear right perihilar consolidation with associated traction bronchiectasis.  Likely postradiation changes.  Additional  new peribronchial vascular/nodular groundglass opacities with areas of consolidation or seen primarily in the right upper and lower lobes, more distant from expected radiation fields. 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  Per my discussion with pulmonology Dr. 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     05/09/2021 Imaging   Repeat CT chest without contrast showed evolving radiation changes in the right perihilar region likely accounting for interval increase to thickening of the posterior wall of the right upper lobe bronchus.  The more peripheral right lung groundglass opacities seen on most recent study have generally improved with exception of one peripherally right upper lobe which appears new.  These are likely inflammatory.  No findings highly suspicious for local recurrence.   05/30/2021 - 01/10/2022 Chemotherapy   Durvalumab q14d     10/07/2021 Imaging   MRI brain with and without contrast showed 2 contrast-enhancing lesions, most consistent with metastatic disease.  Largest lesion is in the left frontal lobe and measures up to 1.3 cm with a large amount of surrounding edema and 9 mm rightward midline shift.  The other enhancing lesion was 4 mm in the right occipital lobe with a small amount of surrounding edema.  No acute or chronic hemorrhage.   10/07/2021 Progression   Headache after colonoscopy, progressive difficulty using her right arm.  Progressively worsening of speech. Stage IV lung cancer with brain metastasis.  started on Dexamethasone.  He was seen by neurosurgery and radiation oncology.  Recommendation is to proceed  with SRS.  Patient was discharged with dexamethasone   10/16/2021 Imaging   PET scan showed postradiation changes in the right supra hilar lung without evidence of local recurrence.  No evidence of lung cancer recurrence or  metastatic disease.  Single focus of intense metabolic activity associated with posterior left 11th rib likely secondary to recent fall/posttraumatic metabolic changes.  Frontal  uptake in the left frontal lobe corresponding to the known metastatic lesion in the brain   10/29/2021 -  Radiation Therapy   Brain Radiation [facility]   12/10/2021 Imaging   Brain MRI: Continued interval decrease in size of a left frontal lobe metastasis, now measuring 10 mm. Moderate surrounding edema has also decreased.  3-4 mm metastasis within the right occipital lobe, not significantly changed in size. There is now a small focus of central non enhancement within this lesion. Increased edema. 3 mm enhancing metastasis within the high left parietal lobe. In retrospect, this was likely present as a subtle punctate focus of enhancement on the prior brain MRI of 10/22/2021.   01/18/2022 Imaging   CT chest abdomen pelvis w contrast 1. Stable post treatment changes in the right upper lobe medially with dense radiation fibrosis but no findings to suggest local recurrent tumor. 2. Significant progression of ill-defined ground-glass opacity, interstitial thickening and airspace nodularity in the right upper lobe and upper aspect of the right lower lobe. Findings could reflect an inflammatory or atypical infectious process, drug induced pneumonitis or interstitial spread of tumor.3. No mediastinal or hilar mass or adenopathy. 4. No findings for abdominal/pelvic metastatic disease or osseous metastatic disease.   02/19/2022 Imaging   MRI brain w wo contrast showed Three metastatic deposits in the brain appear smaller. Decreased edema in the left parietal lobe. No new lesions    04/04/2022 Imaging   CT chest w contrast 1. Stable radiation changes in the right perihilar region without evidence of local recurrence. 2. Interval improvement in the patchy ground-glass opacities previously demonstrated in both lungs, most consistent with a  resolving infectious or inflammatory process (including immunotherapy-related pneumonitis). 3. No evidence of metastatic disease.   05/14/2022 Miscellaneous   He had a mechanical fall and fractured his left elbow. xrays showed a left elbow fracture  05/26/22 seen by orthopedic surgeon, cast applied.    05/27/2022 Imaging   MRI brain w wo contrast 1. All three known brain metastases have enlarged since August, with increasing regional edema. And there is a small new left anterior cerebellar 4-5 mm metastasis (was questionably punctate on the May MRI). This constellation could reflect a combination of True Progression (Left Cerebellar lesion) and pseudoprogression/radionecrosis (3 existing mets).    2. No significant intracranial mass effect. 3. Underlying moderate cerebral white matter signal changes.   05/30/2022 Imaging   CT chest abdomen pelvis w contrast  1. 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 There is a stable small right pleural effusion, but no definitive findings to suggest residual or recurrent disease on today's examination. 2. No signs of metastatic disease in the abdomen or pelvis.  3. Aortic atherosclerosis, in addition to two-vessel coronary artery disease. Please note that although the presence of coronary artery calcium documents the presence of coronary artery disease, the severity of this disease and any potential stenosis cannot be assessed on this non-gated CT examination. Assessment for potential risk factor modification, dietary therapy or pharmacologic therapy may be warranted, if clinically indicated. 4. Hepatic  steatosis. 5. Colonic diverticulosis without evidence of acute diverticulitis at this time. 6. Incompletely imaged but apparently occluded vascular graft in the superior aspect of the right anterior thigh. Referral to vascular specialist  for further clinical evaluation should be considered if clinically appropriate. 7. Additional incidental findings, as above.   07/22/2022 Imaging   MRI brain  1. No new or enlarging lesions. No intracranial disease progression. 2. Decreased size of treated lesions of the left frontoparietal junction and left postcentral gyrus. 3. Unchanged lesions in the left cerebellum and right occipital lobe.    CNS Oncologic History 10/29/21: SRS to 3 metastases ([place])  Interval History: EDMOND GINSBERG presents today for follow up after recent MRI brain.  No new or progressive changes, though he does describe ongoing headaches and poor appetite.  Continues to ambulate with a cane as prior.  He has not received immunotherapy since January.    H+P (06/20/22) Patient presents today for evaluation for neurologic complaints.  He describes gradual onset of impaired speech output and right arm greater than right leg weakness.  This led to a fall and an injury to his left arm.  He was started on decadron two weeks ago, this has led to considerable improvement in symptoms.  Today he feels at his prior baseline.  No issues tolerating the steroids.  He is due for durvalumab infusion today with Dr. Cathie Hoops.   Medications: Current Outpatient Medications on File Prior to Visit  Medication Sig Dispense Refill   aspirin EC 81 MG tablet Take 81 mg by mouth daily. Swallow whole.     BEVESPI AEROSPHERE 9-4.8 MCG/ACT AERO Inhale into the lungs. (Patient not taking: Reported on 10/23/2022)     clopidogrel (PLAVIX) 75 MG tablet TAKE 1 TABLET BY MOUTH DAILY 100 tablet 2   fluticasone (FLONASE) 50 MCG/ACT nasal spray Place 1 spray into both nostrils 2 (two) times daily. 15.8 mL 2   levothyroxine (SYNTHROID) 100 MCG tablet Take 1 tablet (100 mcg total) by mouth daily before breakfast. 30 tablet 0   simvastatin (ZOCOR) 40 MG tablet Take 40 mg by mouth at bedtime.     Current Facility-Administered Medications on File Prior to  Visit  Medication Dose Route Frequency Provider Last Rate Last Admin   albuterol (PROVENTIL) (2.5 MG/3ML) 0.083% nebulizer solution 2.5 mg  2.5 mg Nebulization Once Salena Saner, MD        Allergies: No Known Allergies Past Medical History:  Past Medical History:  Diagnosis Date   Allergy    Cancer    lung   COPD (chronic obstructive pulmonary disease)    Dyspnea    former smoker. only doe   History of kidney stones    Hypertension    Peripheral vascular disease    Pre-diabetes    Sleep apnea    Past Surgical History:  Past Surgical History:  Procedure Laterality Date   ANTERIOR CERVICAL DECOMP/DISCECTOMY FUSION N/A 03/14/2020   Procedure: ANTERIOR CERVICAL DECOMPRESSION/DISCECTOMY FUSION 3 LEVELS C4-7;  Surgeon: Venetia Night, MD;  Location: [facility] ORS;  Service: Neurosurgery;  Laterality: N/A;   BACK SURGERY  2011   CENTRAL LINE INSERTION Right 09/10/2016   Procedure: CENTRAL LINE INSERTION;  Surgeon: Renford Dills, MD;  Location: [facility] ORS;  Service: Vascular;  Laterality: Right;   CERVICAL FUSION     C4-c7 on Mar 14, 2020   COLONOSCOPY  2013 ?   COLONOSCOPY N/A 09/09/2021   Procedure: COLONOSCOPY;  Surgeon: Jaynie Collins, DO;  Location: [facility]  ENDOSCOPY;  Service: Gastroenterology;  Laterality: N/A;   CORONARY ANGIOPLASTY     ESOPHAGOGASTRODUODENOSCOPY N/A 09/09/2021   Procedure: ESOPHAGOGASTRODUODENOSCOPY (EGD);  Surgeon: Jaynie Collins, DO;  Location: [facility] ENDOSCOPY;  Service: Gastroenterology;  Laterality: N/A;   FEMORAL-POPLITEAL BYPASS GRAFT Right 09/10/2016   Procedure: BYPASS GRAFT FEMORAL-POPLITEAL ARTERY ( BELOW KNEE );  Surgeon: Renford Dills, MD;  Location: [facility] ORS;  Service: Vascular;  Laterality: Right;   FEMORAL-TIBIAL BYPASS GRAFT Right 03/23/2020   Procedure: BYPASS GRAFT RIGHT FEMORAL- DISTAL TIBIAL ARTERY WITH DISTAL FLOW GRAFT;  Surgeon: Renford Dills, MD;  Location: [facility] ORS;  Service: Vascular;  Laterality: Right;   LEFT  HEART CATH AND CORONARY ANGIOGRAPHY Left 05/16/2021   Procedure: LEFT HEART CATH AND CORONARY ANGIOGRAPHY;  Surgeon: Armando Reichert, MD;  Location: [facility] INVASIVE CV LAB;  Service: Cardiovascular;  Laterality: Left;   LOWER EXTREMITY ANGIOGRAPHY Right 11/18/2016   Procedure: Lower Extremity Angiography;  Surgeon: Renford Dills, MD;  Location: [facility] INVASIVE CV LAB;  Service: Cardiovascular;  Laterality: Right;   LOWER EXTREMITY ANGIOGRAPHY Right 12/09/2016   Procedure: Lower Extremity Angiography;  Surgeon: Renford Dills, MD;  Location: [facility] INVASIVE CV LAB;  Service: Cardiovascular;  Laterality: Right;   LOWER EXTREMITY ANGIOGRAPHY Right 11/15/2019   Procedure: LOWER EXTREMITY ANGIOGRAPHY;  Surgeon: Renford Dills, MD;  Location: [facility] INVASIVE CV LAB;  Service: Cardiovascular;  Laterality: Right;   LOWER EXTREMITY ANGIOGRAPHY Right 12/01/2019   Procedure: Lower Extremity Angiography;  Surgeon: Annice Needy, MD;  Location: [facility] INVASIVE CV LAB;  Service: Cardiovascular;  Laterality: Right;   LOWER EXTREMITY ANGIOGRAPHY Right 12/02/2019   Procedure: LOWER EXTREMITY ANGIOGRAPHY;  Surgeon: Renford Dills, MD;  Location: [facility] INVASIVE CV LAB;  Service: Cardiovascular;  Laterality: Right;   LOWER EXTREMITY ANGIOGRAPHY Right 03/23/2020   Procedure: Lower Extremity Angiography;  Surgeon: Renford Dills, MD;  Location: [facility] INVASIVE CV LAB;  Service: Cardiovascular;  Laterality: Right;   LOWER EXTREMITY ANGIOGRAPHY Right 09/25/2021   Procedure: Lower Extremity Angiography;  Surgeon: Renford Dills, MD;  Location: [facility] INVASIVE CV LAB;  Service: Cardiovascular;  Laterality: Right;   LOWER EXTREMITY ANGIOGRAPHY Right 09/26/2021   Procedure: Lower Extremity Angiography;  Surgeon: Annice Needy, MD;  Location: [facility] INVASIVE CV LAB;  Service: Cardiovascular;  Laterality: Right;   LOWER EXTREMITY ANGIOGRAPHY Right 04/08/2022   Procedure: Lower Extremity Angiography;  Surgeon: Renford Dills,  MD;  Location: [facility] INVASIVE CV LAB;  Service: Cardiovascular;  Laterality: Right;   LOWER EXTREMITY INTERVENTION  11/18/2016   Procedure: Lower Extremity Intervention;  Surgeon: Renford Dills, MD;  Location: [facility] INVASIVE CV LAB;  Service: Cardiovascular;;   PERIPHERAL VASCULAR CATHETERIZATION Right 01/02/2015   Procedure: Lower Extremity Angiography;  Surgeon: Renford Dills, MD;  Location: [facility] INVASIVE CV LAB;  Service: Cardiovascular;  Laterality: Right;   PERIPHERAL VASCULAR CATHETERIZATION Right 06/18/2015   Procedure: Lower Extremity Angiography;  Surgeon: Annice Needy, MD;  Location: [facility] INVASIVE CV LAB;  Service: Cardiovascular;  Laterality: Right;   PERIPHERAL VASCULAR CATHETERIZATION  06/18/2015   Procedure: Lower Extremity Intervention;  Surgeon: Annice Needy, MD;  Location: [facility] INVASIVE CV LAB;  Service: Cardiovascular;;   PERIPHERAL VASCULAR CATHETERIZATION N/A 10/09/2015   Procedure: Abdominal Aortogram w/Lower Extremity;  Surgeon: Renford Dills, MD;  Location: [facility] INVASIVE CV LAB;  Service: Cardiovascular;  Laterality: N/A;   PERIPHERAL VASCULAR CATHETERIZATION  10/09/2015   Procedure: Lower Extremity Intervention;  Surgeon: Renford Dills, MD;  Location: [facility]  INVASIVE CV LAB;  Service: Cardiovascular;;   PERIPHERAL VASCULAR CATHETERIZATION Right 10/10/2015   Procedure: Lower Extremity Angiography;  Surgeon: Annice NeedyJason S Dew, MD;  Location: [facility] INVASIVE CV LAB;  Service: Cardiovascular;  Laterality: Right;   PERIPHERAL VASCULAR CATHETERIZATION Right 10/10/2015   Procedure: Lower Extremity Intervention;  Surgeon: Annice NeedyJason S Dew, MD;  Location: [facility] INVASIVE CV LAB;  Service: Cardiovascular;  Laterality: Right;   PERIPHERAL VASCULAR CATHETERIZATION Right 12/03/2015   Procedure: Lower Extremity Angiography;  Surgeon: Annice NeedyJason S Dew, MD;  Location: [facility] INVASIVE CV LAB;  Service: Cardiovascular;  Laterality: Right;   PERIPHERAL VASCULAR CATHETERIZATION Right 12/04/2015   Procedure: Lower  Extremity Angiography;  Surgeon: Annice NeedyJason S Dew, MD;  Location: [facility] INVASIVE CV LAB;  Service: Cardiovascular;  Laterality: Right;   PERIPHERAL VASCULAR CATHETERIZATION  12/04/2015   Procedure: Lower Extremity Intervention;  Surgeon: Annice NeedyJason S Dew, MD;  Location: [facility] INVASIVE CV LAB;  Service: Cardiovascular;;   PERIPHERAL VASCULAR CATHETERIZATION Right 06/30/2016   Procedure: Lower Extremity Angiography;  Surgeon: Annice NeedyJason S Dew, MD;  Location: [facility] INVASIVE CV LAB;  Service: Cardiovascular;  Laterality: Right;   PERIPHERAL VASCULAR CATHETERIZATION Right 06/25/2016   Procedure: Lower Extremity Angiography;  Surgeon: Renford DillsGregory G Schnier, MD;  Location: [facility] INVASIVE CV LAB;  Service: Cardiovascular;  Laterality: Right;   PERIPHERAL VASCULAR CATHETERIZATION Right 07/01/2016   Procedure: Lower Extremity Angiography;  Surgeon: Renford DillsGregory G Schnier, MD;  Location: [facility] INVASIVE CV LAB;  Service: Cardiovascular;  Laterality: Right;   PERIPHERAL VASCULAR CATHETERIZATION  07/01/2016   Procedure: Lower Extremity Intervention;  Surgeon: Renford DillsGregory G Schnier, MD;  Location: [facility] INVASIVE CV LAB;  Service: Cardiovascular;;   PERIPHERAL VASCULAR CATHETERIZATION Right 08/01/2016   Procedure: Lower Extremity Angiography;  Surgeon: Renford DillsGregory G Schnier, MD;  Location: [facility] INVASIVE CV LAB;  Service: Cardiovascular;  Laterality: Right;   PORTA CATH INSERTION N/A 10/05/2020   Procedure: PORTA CATH INSERTION;  Surgeon: Renford DillsSchnier, Gregory G, MD;  Location: [facility] INVASIVE CV LAB;  Service: Cardiovascular;  Laterality: N/A;   stent placement in right leg Right    VIDEO BRONCHOSCOPY N/A 09/14/2020   Procedure: VIDEO BRONCHOSCOPY WITH FLUORO;  Surgeon: Salena SanerGonzalez, Carmen L, MD;  Location: [facility];  Service: Cardiopulmonary;  Laterality: N/A;   VIDEO BRONCHOSCOPY WITH ENDOBRONCHIAL ULTRASOUND N/A 09/14/2020   Procedure: VIDEO BRONCHOSCOPY WITH ENDOBRONCHIAL ULTRASOUND;  Surgeon: Salena SanerGonzalez, Carmen L, MD;  Location: [facility];  Service:  Cardiopulmonary;  Laterality: N/A;   Social History:  Social History   Socioeconomic History   Marital status: Single    Spouse name: Not on file   Number of children: Not on file   Years of education: Not on file   Highest education level: Not on file  Occupational History   Occupation: unemployed  Tobacco Use   Smoking status: Former    Packs/day: 2.00    Years: 38.00    Additional pack years: 0.00    Total pack years: 76.00    Types: Cigarettes    Quit date: 12/20/2014    Years since quitting: 7.8   Smokeless tobacco: Never   Tobacco comments:    quit smoking in 2017 most smoked 2   Vaping Use   Vaping Use: Former  Substance and Sexual Activity   Alcohol use: Not Currently   Drug use: No   Sexual activity: Yes  Other Topics Concern   Not on file  Social History Narrative   Not on file   Social Determinants of Health   Financial Resource Strain: Not on file  Food Insecurity: Not on file  Transportation Needs: Not on file  Physical Activity: Not on file  Stress: Not on file  Social Connections: Not on file  Intimate Partner Violence: Not on file   Family History:  Family History  Problem Relation Age of Onset   Diabetes Mother    Hypertension Mother    Heart murmur Mother    Leukemia Mother    Throat cancer Maternal Grandmother    Colon cancer Neg Hx    Breast cancer Neg Hx     Review of Systems: Constitutional: Doesn't report fevers, chills or abnormal weight loss Eyes: Doesn't report blurriness of vision Ears, nose, mouth, throat, and face: Doesn't report sore throat Respiratory: Doesn't report cough, dyspnea or wheezes Cardiovascular: Doesn't report palpitation, chest discomfort  Gastrointestinal:  Doesn't report nausea, constipation, diarrhea GU: Doesn't report incontinence Skin: Doesn't report skin rashes Neurological: Per HPI Musculoskeletal: Doesn't report joint pain Behavioral/Psych: Doesn't report anxiety  Physical Exam: Vitals:    10/24/22 1053  BP: (!) 154/80  Pulse: 90  Resp: 18  Temp: (!) 97 F (36.1 C)   KPS: 80. General: Alert, cooperative, pleasant, in no acute distress Head: Normal EENT: No conjunctival injection or scleral icterus.  Lungs: Resp effort normal Cardiac: Regular rate Abdomen: Non-distended abdomen Skin: No rashes cyanosis or petechiae. Extremities: Left arm in sling  Neurologic Exam: Mental Status: Awake, alert, attentive to examiner. Oriented to self and environment. Language is fluent with intact comprehension.  Cranial Nerves: Visual acuity is grossly normal. Visual fields are full. Extra-ocular movements intact. No ptosis. Face is symmetric Motor: Tone and bulk are normal. Power is full in both arms and legs. Reflexes are symmetric, no pathologic reflexes present.  Sensory: Intact to light touch Gait: Normal.   Labs: I have reviewed the data as listed    Component Value Date/Time   NA 138 08/29/2022 1008   NA 141 10/14/2019 1101   K 3.6 08/29/2022 1008   CL 102 08/29/2022 1008   CO2 25 08/29/2022 1008   GLUCOSE 196 (H) 08/29/2022 1008   BUN 15 08/29/2022 1008   BUN 15 10/14/2019 1101   BUN 16 07/03/2014 1205   CREATININE 1.13 08/29/2022 1008   CREATININE 1.34 (H) 07/03/2014 1205   CALCIUM 8.9 08/29/2022 1008   PROT 6.9 08/29/2022 1008   PROT 7.0 04/16/2020 1525   ALBUMIN 3.5 08/29/2022 1008   ALBUMIN 4.3 04/16/2020 1525   AST 29 08/29/2022 1008   ALT 26 08/29/2022 1008   ALKPHOS 72 08/29/2022 1008   BILITOT 0.4 08/29/2022 1008   BILITOT 0.4 04/16/2020 1525   GFRNONAA >60 08/29/2022 1008   GFRNONAA 59 (L) 07/03/2014 1205   GFRAA >60 03/30/2020 0536   GFRAA >60 07/03/2014 1205   Lab Results  Component Value Date   WBC 3.9 (L) 08/29/2022   NEUTROABS 2.7 08/29/2022   HGB 12.7 (L) 08/29/2022   HCT 38.3 (L) 08/29/2022   MCV 84.2 08/29/2022   PLT 224 08/29/2022    Imaging:  MR BRAIN W WO CONTRAST  Result Date: 10/15/2022 CLINICAL DATA:  CNS neoplasm. SRS  treatment follow-up. Weakness and difficulty walking. EXAM: MRI HEAD WITHOUT AND WITH CONTRAST TECHNIQUE: Multiplanar, multiecho pulse sequences of the brain and surrounding structures were obtained without and with intravenous contrast. CONTRAST:  9mL GADAVIST GADOBUTROL 1 MMOL/ML IV SOLN COMPARISON:  07/18/2022 FINDINGS: Brain: No acute infarct or hemorrhage. Small amount of chronic blood associated with lesion of the left frontal operculum. There are multiple intraparenchymal  lesions: 1. Left cerebellum, 5 mm, previously 2 mm (series 1022, image 144) 2. Right cerebellum, 3 mm, unchanged, image 131 3. Right occipital lobe, 17 mm, previously 8 mm, image 125 4. Left frontal operculum d, 23 mm, previously 20 mm, image 73 5. Left postcentral gyrus 13 mm, previously 7 mm, image 36 All of these lesions demonstrate a ring enhancement type pattern. Surrounding vasogenic edema has worsened at all lesion sites Vascular: Normal flow voids. Skull and upper cervical spine: Normal marrow signal. Sinuses/Orbits: Negative. Other: None. IMPRESSION: Interval increase in size of multiple intraparenchymal lesions with worsened surrounding vasogenic edema. 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 No new lesions. Electronically Signed   By: Deatra Robinson M.D.   On: 10/15/2022 22:11    [facility] Clinician Interpretation: I have personally reviewed the radiological images as listed.  My interpretation, in the context of the patient's clinical presentation, is progressive disease   Assessment/Plan Malignant neoplasm metastatic to brain  BALRAJ BRAYFIELD is clinically stable today, only with worsening of headaches.  Today there are 5 lesions visible c/w brain metastases from lung cancer.  Each of these demonstrate growth from prior study.  Only left frontal mass was treated with radiosurgery last year.    We did recommend treating the remaining lesions with  SRS, he is agreeable with this.  Case will be discussed in detail in CNS tumor board next week.  Recommended resuming decadron 2mg  daily if tolerated.    We appreciate the opportunity to participate in the care of [employer].   We ask that REILLEY LATORRE return to clinic in 3 months following post-SRS brain MRI, or sooner as needed.  All questions were answered. The patient knows to call the clinic with any problems, questions or concerns. No barriers to learning were detected.  The total time spent in the encounter was 30 minutes and more than 50% was on counseling and review of test results   Henreitta Leber, MD Medical Director of Neuro-Oncology [facility] 10/24/22 11:03 AM

## 2022-10-27 ENCOUNTER — Inpatient Hospital Stay: Payer: Medicare Other

## 2022-10-27 ENCOUNTER — Other Ambulatory Visit: Payer: Self-pay | Admitting: Radiation Therapy

## 2022-10-27 ENCOUNTER — Telehealth: Payer: Self-pay | Admitting: Radiation Therapy

## 2022-10-27 ENCOUNTER — Telehealth: Payer: Self-pay | Admitting: Radiation Oncology

## 2022-10-27 DIAGNOSIS — C7931 Secondary malignant neoplasm of brain: Secondary | ICD-10-CM

## 2022-10-27 LAB — TOXOPLASMA GONDII, PCR: Toxoplasma Gondii, PCR: NEGATIVE

## 2022-10-27 NOTE — Telephone Encounter (Signed)
Called to introduce myself and give pt my contact information in case he has any questions or concerns during his radiation treatment planning.   Joshua [name] has been previously treated by Dr. Rushie Chestnut with [facility] and has requested to have his next course here in [place]. He is scheduled to meet with Dr. Mitzi Hansen to discuss next steps on Wed 4/10.   Joshua [name] R.T(R)(T) Radiation Special Procedures Navigator

## 2022-10-27 NOTE — Telephone Encounter (Signed)
Called patient to schedule a consultation w. Dr. Moody. No answer, LVM for a return call.  

## 2022-10-28 ENCOUNTER — Other Ambulatory Visit: Payer: Self-pay | Admitting: Oncology

## 2022-10-28 NOTE — Progress Notes (Signed)
Location/Histology of Brain Tumor: RUL Lung with mets to brain  Patient presented for surveillance scans for known brain metastases.  He complains of gradual onset of impaired speech output and right arm greater than right leg weakness.  He reports worsening headaches.  MRI Brain 11/05/2022  MRI Brain 10/14/2022:  1. Left cerebellum, 5 mm, previously 2 mm  2. Right cerebellum, 3 mm, unchanged 3. Right occipital lobe, 17 mm, previously 8 mm 4. Left frontal operculum d, 23 mm, previously 20 mm 5. Left postcentral gyrus 13 mm, previously 7 mm  MRI Brain 07/19/2023:  1. Left cerebellum, 2 mm, unchanged.  2. Right occipital lobe, 8 mm, unchanged, 3. Left frontoparietal junction, 20 mm pelvis slightly smaller. Markedly decreased contrast enhancement and decreased surrounding hyperintense T2-weighted signal.  4. Left postcentral gyrus, 7 mm, smaller   Past or anticipated interventions, if any, per neurosurgery:    Past or anticipated interventions, if any, per medical oncology:  Dr. Barbaraann Cao 10/24/2022 -Today there are 5 lesions visible c/w brain metastases from lung cancer.  Each of these demonstrate growth from prior study.  Only left frontal mass was treated with radiosurgery last year.   -We did recommend treating the remaining lesions with SRS, he is agreeable with this. -Case will be discussed in detail in CNS tumor board next week. -We ask that [name] return to clinic in 3 months following post-SRS brain MRI      Dose of Decadron, if applicable: 2 mg daily  Recent neurologic symptoms, if any:  Seizures: No Headaches: Much improved since starting the steroids. Nausea: No Dizziness/ataxia: He reports some dizziness and feeling off balance. Difficulty with hand coordination:He notes occasional right had deficits such as picking up something. Focal numbness/weakness: He reports occasional numbness to his right hand fingertips. Visual deficits/changes: He reports some  blurred vision. Confusion/Memory deficits: None. Speech: He reports occasional slurred speech when he talks for a long time.  He notes some drooling when he focuses his attention on a task.   SAFETY ISSUES: Prior radiation? ChemoRT right lung 2022, SRS L Frontal 05/23/2022. Pacemaker/ICD? No Possible current pregnancy? N/a Is the patient on methotrexate? No  Additional Complaints / other details:

## 2022-10-28 NOTE — Telephone Encounter (Signed)
T4 Order: [id number] Status: Final result     Visible to patient: Yes (seen)     Next appt: 10/29/2022 at 12:30 PM in Radiation Oncology [facility])     Dx: Squamous cell carcinoma of right lung   0 Result Notes          Component Ref Range & Units 2 mo ago (08/29/22) 1 yr ago (08/30/21) 1 yr ago (08/02/21) 1 yr ago (05/30/21) 1 yr ago (03/07/21) 1 yr ago (02/07/21) 1 yr ago (01/24/21)  T4, Total 4.5 - 12.0 ug/dL 9.5 6.3 CM 7.0 CM 4.8 CM 2.3 Low  CM 1.8 Low  CM 5.2 CM  Comment: (NOTE) Performed At: [facility] [address] [place], [place] [id number] Jolene Schimke MD SU:[id number]  Resulting Agency [facility]         Specimen Collected: 08/29/22 10:08 Last Resulted: 08/31/22 08:36      Lab Flowsheet      Order Details      View Encounter      Lab and Collection Details      Routing      Result History    View All Conversations on this Encounter      CM=Additional comments      Result Care Coordination   Patient Communication   Add Comments   Seen Back to Top      Other Results from 08/29/2022   Contains abnormal data TSH Order: [id number] Status: Final result      Visible to patient: Yes (seen)      Next appt: 10/29/2022 at 12:30 PM in Radiation Oncology [facility] NURSE)      Dx: Squamous cell carcinoma of right lung    0 Result Notes            Component Ref Range & Units 2 mo ago (08/29/22) 2 mo ago (08/15/22) 3 mo ago (07/04/22) 4 mo ago (06/20/22) 4 mo ago (06/06/22) 5 mo ago (05/09/22) 6 mo ago (04/25/22)  TSH 0.350 - 4.500 uIU/mL 48.673 High  52.807 High  CM 19.367 High  CM 8.041 High  CM 71.000 High  CM 32.916 High  CM 42.267 High  CM  Comment: Performed by a 3rd Generation assay with a functional sensitivity of <=0.01 uIU/mL. Performed at [facility], [address] [place], [place] [address]  Resulting Agency [facility] CLIN LAB [facility] CLIN  LAB [facility] CLIN LAB CH CLIN LAB [facility] CLIN LAB CH CLIN LAB [facility] CLIN LAB         Specimen Collected: 08/29/22 10:08 Last Resulted: 08/29/22 12:26

## 2022-10-28 NOTE — Progress Notes (Signed)
Radiation Oncology         [phone] ________________________________  Name: Joshua [name]        MRN: [id number]  Date of Service: 10/29/2022 DOB: [birth year]  CC:Patrice Paradise, MD  Henreitta Leber, MD     REFERRING PHYSICIAN: Henreitta Leber, MD   DIAGNOSIS: The primary encounter diagnosis was Malignant neoplasm metastatic to brain. A diagnosis of Squamous cell carcinoma of right lung was also pertinent to this visit.   HISTORY OF PRESENT ILLNESS: BREYLIN DOM is a 67 y.o. male seen at the request of Dr. Barbaraann Cao for a diagnosis of Stage IV, NSCLC of the RUL.  The patient was originally diagnosed with what appeared to be stage III non-small cell lung cancer involving the right upper lobe this was diagnosed in March 2022 and by bronchoscopy confirmed to be a squamous cell carcinoma.  He went on to receive chemoradiation at the [facility] which he completed in May 2022.  He went on to receive maintenance nivolumab until September 2023.  Of note in March 2023 2 contrast-enhancing lesions consistent with metastatic disease were seen in the left frontal lobe measuring 1.3 cm in a 4 mm right occipital lesion.  He was treated on 10/29/2021 with single fraction stereotactic radiosurgery to the left frontal lesion alone and has been followed closely since. In retrospect there was a third punctate lesion but this and the right occipital lesion do not  appear to have been treated. He has had what was felt to be possible pseudo progression by MRI of the brain on 05/23/2022.  He was referred to Dr. Barbaraann Cao in December 2023 due to impaired speech right-sided weakness.  He was started on steroids and encouraged to have close follow-up.  An MRI was repeated on 07/18/2022 and showed a decrease in the size of the lesions that were of concern.  His scan was repeated on 10/14/2022, the right cerebellum was 3 mm and stable, however the other four lesions had increased in size  including the left cerebellum at 5 mm, previously 2 mm, the right occipital lobe at 17 mm previously 8 mm, the left frontal operculum at 23 mm, previously 20 mm, and the left postcentral gyrus 13 mm, previously 7 mm.  His case was discussed in brain oncology conference, and it was recommended to consider additional salvage radiosurgery.  He is seen today to discuss this and is scheduled to undergo a 3T MRI on 11/05/2022.    PREVIOUS RADIATION THERAPY:    10/29/21: The left frontal 1.3 cm lesion was treated in single fraction to 20 Gy with SRS technique with Dr. Rushie Chestnut  10/10/20-11/27/20: The RUL and regional nodes were treated to 70 Gy in 35 fractions with Dr. Rushie Chestnut  PAST MEDICAL HISTORY:  Past Medical History:  Diagnosis Date   Allergy    Cancer    lung   COPD (chronic obstructive pulmonary disease)    Dyspnea    former smoker. only doe   History of kidney stones    Hypertension    Peripheral vascular disease    Pre-diabetes    Sleep apnea        PAST SURGICAL HISTORY: Past Surgical History:  Procedure Laterality Date   ANTERIOR CERVICAL DECOMP/DISCECTOMY FUSION N/A 03/14/2020   Procedure: ANTERIOR CERVICAL DECOMPRESSION/DISCECTOMY FUSION 3 LEVELS C4-7;  Surgeon: Venetia Night, MD;  Location: [facility] ORS;  Service: Neurosurgery;  Laterality: N/A;   BACK SURGERY  2011   CENTRAL LINE INSERTION Right  09/10/2016   Procedure: CENTRAL LINE INSERTION;  Surgeon: Renford DillsSchnier, Gregory G, MD;  Location: [facility] ORS;  Service: Vascular;  Laterality: Right;   CERVICAL FUSION     C4-c7 on Mar 14, 2020   COLONOSCOPY  2013 ?   COLONOSCOPY N/A 09/09/2021   Procedure: COLONOSCOPY;  Surgeon: Jaynie Collinsusso, Steven Michael, DO;  [facility];  Service: Gastroenterology;  Laterality: N/A;   CORONARY ANGIOPLASTY     ESOPHAGOGASTRODUODENOSCOPY N/A 09/09/2021   Procedure: ESOPHAGOGASTRODUODENOSCOPY (EGD);  Surgeon: Jaynie Collinsusso, Steven Michael, DO;  [facility];  Service: Gastroenterology;   Laterality: N/A;   FEMORAL-POPLITEAL BYPASS GRAFT Right 09/10/2016   Procedure: BYPASS GRAFT FEMORAL-POPLITEAL ARTERY ( BELOW KNEE );  Surgeon: Renford DillsSchnier, Gregory G, MD;  Location: [facility] ORS;  Service: Vascular;  Laterality: Right;   FEMORAL-TIBIAL BYPASS GRAFT Right 03/23/2020   Procedure: BYPASS GRAFT RIGHT FEMORAL- DISTAL TIBIAL ARTERY WITH DISTAL FLOW GRAFT;  Surgeon: Renford DillsSchnier, Gregory G, MD;  Location: [facility] ORS;  Service: Vascular;  Laterality: Right;   LEFT HEART CATH AND CORONARY ANGIOGRAPHY Left 05/16/2021   Procedure: LEFT HEART CATH AND CORONARY ANGIOGRAPHY;  Surgeon: Armando Reichertrgel, Ryan Matthew, MD;  [facility];  Service: Cardiovascular;  Laterality: Left;   LOWER EXTREMITY ANGIOGRAPHY Right 11/18/2016   Procedure: Lower Extremity Angiography;  Surgeon: Renford DillsGregory G Schnier, MD;  Location: [facility] INVASIVE CV LAB;  Service: Cardiovascular;  Laterality: Right;   LOWER EXTREMITY ANGIOGRAPHY Right 12/09/2016   Procedure: Lower Extremity Angiography;  Surgeon: Renford DillsSchnier, Gregory G, MD;  Location: [facility] INVASIVE CV LAB;  Service: Cardiovascular;  Laterality: Right;   LOWER EXTREMITY ANGIOGRAPHY Right 11/15/2019   Procedure: LOWER EXTREMITY ANGIOGRAPHY;  Surgeon: Renford DillsSchnier, Gregory G, MD;  Location: [facility] INVASIVE CV LAB;  Service: Cardiovascular;  Laterality: Right;   LOWER EXTREMITY ANGIOGRAPHY Right 12/01/2019   Procedure: Lower Extremity Angiography;  Surgeon: Annice Needyew, Jason S, MD;  Location: [facility] INVASIVE CV LAB;  Service: Cardiovascular;  Laterality: Right;   LOWER EXTREMITY ANGIOGRAPHY Right 12/02/2019   Procedure: LOWER EXTREMITY ANGIOGRAPHY;  Surgeon: Renford DillsSchnier, Gregory G, MD;  Location: [facility] INVASIVE CV LAB;  Service: Cardiovascular;  Laterality: Right;   LOWER EXTREMITY ANGIOGRAPHY Right 03/23/2020   Procedure: Lower Extremity Angiography;  Surgeon: Renford DillsSchnier, Gregory G, MD;  Location: [facility] INVASIVE CV LAB;  Service: Cardiovascular;  Laterality: Right;   LOWER EXTREMITY ANGIOGRAPHY Right 09/25/2021    Procedure: Lower Extremity Angiography;  Surgeon: Renford DillsSchnier, Gregory G, MD;  Location: [facility] INVASIVE CV LAB;  Service: Cardiovascular;  Laterality: Right;   LOWER EXTREMITY ANGIOGRAPHY Right 09/26/2021   Procedure: Lower Extremity Angiography;  Surgeon: Annice Needyew, Jason S, MD;  Location: [facility] INVASIVE CV LAB;  Service: Cardiovascular;  Laterality: Right;   LOWER EXTREMITY ANGIOGRAPHY Right 04/08/2022   Procedure: Lower Extremity Angiography;  Surgeon: Renford DillsSchnier, Gregory G, MD;  Location: [facility] INVASIVE CV LAB;  Service: Cardiovascular;  Laterality: Right;   LOWER EXTREMITY INTERVENTION  11/18/2016   Procedure: Lower Extremity Intervention;  Surgeon: Renford DillsGregory G Schnier, MD;  Location: [facility] INVASIVE CV LAB;  Service: Cardiovascular;;   PERIPHERAL VASCULAR CATHETERIZATION Right 01/02/2015   Procedure: Lower Extremity Angiography;  Surgeon: Renford DillsGregory G Schnier, MD;  Location: [facility] INV[facility]IVE CV LAB;  Service: Cardiovascular;  Laterality: Right;   PERIPHERAL VASCULAR CATHETERIZATION Right 06/18/2015   Procedure: Lower Extremity Angiography;  Surgeon: Annice NeedyJason S Dew, MD;  Location: [facility] LAB;  Service: Cardiovascular;  Laterality: Right;   PERIPHERAL VASCULAR CATHETERIZATION  06/18/2015   Procedure: Lower Extremity Intervention;  Surgeon: Annice NeedyJason S Dew, MD;  [facility]  INVASIVE CV LAB;  Service: Cardiovascular;;   PERIPHERAL VASCULAR CATHETERIZATION N/A 10/09/2015   Procedure: Abdominal Aortogram w/Lower Extremity;  Surgeon: Renford Dills, MD;  Location: [facility] INVASIVE CV LAB;  Service: Cardiovascular;  Laterality: N/A;   PERIPHERAL VASCULAR CATHETERIZATION  10/09/2015   Procedure: Lower Extremity Intervention;  Surgeon: Renford Dills, MD;  Location: [facility] INVAS[facility] CV LAB;  Service: Cardiovascular;;   PERIPHERAL VASCULAR CATHETERIZATION Right 10/10/2015   Procedure: Lower Extremity Angiography;  Surgeon: Annice Needy, MD;  Location: [facility] CV LAB;  Service: Cardiovascular;  Laterality: Right;   PERIPHERAL  VASCULAR CATHETERIZATION Right 10/10/2015   Procedure: Lower Extremity Intervention;  Surgeon: Annice Needy, MD;  Location: [facility] INVASIVE CV LAB;  Service: Cardiovascular;  Laterality: Right;   PERIPHERAL VASCULAR CATHETERIZATION Right 12/03/2015   Procedure: Lower Extremity Angiography;  Surgeon: Annice Needy, MD;  Location: [facility] INV[facility] CV LAB;  Service: Cardiovascular;  Laterality: Right;   PERIPHERAL VASCULAR CATHETERIZATION Right 12/04/2015   Procedure: Lower Extremity Angiography;  Surgeon: Annice Needy, MD;  Location: [facility] INV[facility] LAB;  Service: Cardiovascular;  Laterality: Right;   PERIPHERAL VASCULAR CATHETERIZATION  12/04/2015   Procedure: Lower Extremity Intervention;  Surgeon: Annice Needy, MD;  Location: [facility] CV LAB;  Service: Cardiovascular;;   PERIPHERAL VASCULAR CATHETERIZATION Right 06/30/2016   Procedure: Lower Extremity Angiography;  Surgeon: Annice Needy, MD;  Location: [facility] INVAS[facility] CV LAB;  Service: Cardiovascular;  Laterality: Right;   PERIPHERAL VASCULAR CATHETERIZATION Right 06/25/2016   Procedure: Lower Extremity Angiography;  Surgeon: Renford Dills, MD;  Location: [facility] INV[facility] CV LAB;  Service: Cardiovascular;  Laterality: Right;   PERIPHERAL VASCULAR CATHETERIZATION Right 07/01/2016   Procedure: Lower Extremity Angiography;  Surgeon: Renford Dills, MD;  Location: [facility] LAB;  Service: Cardiovascular;  Laterality: Right;   PERIPHERAL VASCULAR CATHETERIZATION  07/01/2016   Procedure: Lower Extremity Intervention;  Surgeon: Renford Dills, MD;  Location: [facility] CV LAB;  Service: Cardiovascular;;   PERIPHERAL VASCULAR CATHETERIZATION Right 08/01/2016   Procedure: Lower Extremity Angiography;  Surgeon: Renford Dills, MD;  Location: [facility] IN[facility] CV LAB;  Service: Cardiovascular;  Laterality: Right;   PORTA CATH INSERTION N/A 10/05/2020   Procedure: PORTA CATH INSERTION;  Surgeon: Renford Dills, MD;  Location: [facility] INVASIVE CV LAB;   Service: Cardiovascular;  Laterality: N/A;   stent placement in right leg Right    VIDEO BRONCHOSCOPY N/A 09/14/2020   Procedure: VIDEO BRONCHOSCOPY WITH FLUORO;  Surgeon: Salena Saner, MD;  Location: [facility] ORS;  Service: Cardiopulmonary;  Laterality: N/A;   VIDEO BRONCHOSCOPY WITH ENDOBRONCHIAL ULTRASOUND N/A 09/14/2020   Procedure: VIDEO BRONCHOSCOPY WITH ENDOBRONCHIAL ULTRASOUND;  Surgeon: Salena Saner, MD;  Location: [facility];  Service: Cardiopulmonary;  Laterality: N/A;     FAMILY HISTORY:  Family History  Problem Relation Age of Onset   Diabetes Mother    Hypertension Mother    Heart murmur Mother    Leukemia Mother    Throat cancer Maternal Grandmother    Colon cancer Neg Hx    Breast cancer Neg Hx      SOCIAL HISTORY:  reports that he quit smoking about 7 years ago. His smoking use included cigarettes. He has a 76.00 pack-year smoking history. He has never used smokeless tobacco. He reports that he does not currently use alcohol. He reports that he does not use drugs.  The patient is single and lives in [place]. He's accompanied by his aunt.  ALLERGIES: Patient has no known allergies.   MEDICATIONS:  Current Outpatient Medications  Medication Sig Dispense Refill   aspirin EC 81 MG tablet Take 81 mg by mouth daily. Swallow whole.     clopidogrel (PLAVIX) 75 MG tablet TAKE 1 TABLET BY MOUTH DAILY 100 tablet 2   dexamethasone (DECADRON) 2 MG tablet Take 1 tablet (2 mg total) by mouth daily. 30 tablet 1   fluticasone (FLONASE) 50 MCG/ACT nasal spray Place 1 spray into both nostrils 2 (two) times daily. 15.8 mL 2   levothyroxine (SYNTHROID) 100 MCG tablet TAKE 1 TABLET BY MOUTH ONCE DAILY BEFORE BREAKFAST 90 tablet 0   simvastatin (ZOCOR) 40 MG tablet Take 40 mg by mouth at bedtime.     BEVESPI AEROSPHERE 9-4.8 MCG/ACT AERO Inhale into the lungs. (Patient not taking: Reported on 10/23/2022)     No current facility-administered medications for this encounter.    Facility-Administered Medications Ordered in Other Encounters  Medication Dose Route Frequency Provider Last Rate Last Admin   albuterol (PROVENTIL) (2.5 MG/3ML) 0.083% nebulizer solution 2.5 mg  2.5 mg Nebulization Once Salena Saner, MD         REVIEW OF SYSTEMS: On review of systems, the patient reports that he is doing well. He does have some  slurring of his speech that remains and he is currently taking 2 mg of dexamethasone daily. He has some occasional dizziness and feeling off balance, and some numbness in his right hand and fingertips that keeps him from picking up small items as easily. He has some drooling when focusing attention as well. No complaints of chest pain or shortness of breath are noted. No other complaints are verbalized.      PHYSICAL EXAM:  Wt Readings from Last 3 Encounters:  10/29/22 224 lb (101.6 kg)  10/24/22 222 lb (100.7 kg)  10/23/22 220 lb (99.8 kg)   Temp Readings from Last 3 Encounters:  10/29/22 (!) 97.2 F (36.2 C) (Temporal)  10/24/22 (!) 97 F (36.1 C)  10/23/22 (!) 97.1 F (36.2 C)   BP Readings from Last 3 Encounters:  10/29/22 (!) 154/77  10/24/22 (!) 154/80  10/23/22 128/78   Pulse Readings from Last 3 Encounters:  10/29/22 85  10/24/22 90  10/23/22 98   Pain Assessment Pain Score: 0-No pain/10  In general this is a well appearing caucasian male in no acute distress. He's alert and oriented x4 and appropriate throughout the examination. Cardiopulmonary assessment is negative for acute distress and he exhibits normal effort.     ECOG = 1  0 - Asymptomatic (Fully active, able to carry on all predisease activities without restriction)  1 - Symptomatic but completely ambulatory (Restricted in physically strenuous activity but ambulatory and able to carry out work of a light or sedentary nature. For example, light housework, office work)  2 - Symptomatic, <50% in bed during the day (Ambulatory and capable of all self care  but unable to carry out any work activities. Up and about more than 50% of waking hours)  3 - Symptomatic, >50% in bed, but not bedbound (Capable of only limited self-care, confined to bed or chair 50% or more of waking hours)  4 - Bedbound (Completely disabled. Cannot carry on any self-care. Totally confined to bed or chair)  5 - Death   Santiago Glad MM, Creech RH, Tormey DC, et al. [phone]). "Toxicity and response criteria of the [facility]". Am. Evlyn Clines. Oncol. 5 (6): 649-55    LABORATORY  DATA:  Lab Results  Component Value Date   WBC 3.9 (L) 08/29/2022   HGB 12.7 (L) 08/29/2022   HCT 38.3 (L) 08/29/2022   MCV 84.2 08/29/2022   PLT 224 08/29/2022   Lab Results  Component Value Date   NA 138 08/29/2022   K 3.6 08/29/2022   CL 102 08/29/2022   CO2 25 08/29/2022   Lab Results  Component Value Date   ALT 26 08/29/2022   AST 29 08/29/2022   ALKPHOS 72 08/29/2022   BILITOT 0.4 08/29/2022      RADIOGRAPHY: MR BRAIN W WO CONTRAST  Result Date: 10/15/2022 CLINICAL DATA:  CNS neoplasm. SRS treatment follow-up. Weakness and difficulty walking. EXAM: MRI HEAD WITHOUT AND WITH CONTRAST TECHNIQUE: Multiplanar, multiecho pulse sequences of the brain and surrounding structures were obtained without and with intravenous contrast. CONTRAST:  25mL GADAVIST GADOBUTROL 1 MMOL/ML IV SOLN COMPARISON:  07/18/2022 FINDINGS: Brain: No acute infarct or hemorrhage. Small amount of chronic blood associated with lesion of the left frontal operculum. There are multiple intraparenchymal lesions: 1. Left cerebellum, 5 mm, previously 2 mm (series 1022, image 144) 2. Right cerebellum, 3 mm, unchanged, image 131 3. Right occipital lobe, 17 mm, previously 8 mm, image 125 4. Left frontal operculum d, 23 mm, previously 20 mm, image 73 5. Left postcentral gyrus 13 mm, previously 7 mm, image 36 All of these lesions demonstrate a ring enhancement type pattern. Surrounding vasogenic edema has worsened at  all lesion sites Vascular: Normal flow voids. Skull and upper cervical spine: Normal marrow signal. Sinuses/Orbits: Negative. Other: None. IMPRESSION: Interval increase in size of multiple intraparenchymal lesions with worsened surrounding vasogenic edema. 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 No new lesions. Electronically Signed   By: Deatra Robinson M.D.   On: 10/15/2022 22:11       IMPRESSION/PLAN: 1. 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 Dr. Mitzi Hansen discusses the pathology findings and reviews the nature of metastatic brain disease and the rationale for stereotactic radiosurgery. Dr. Mitzi Hansen recommends treating all four of the lesions noted on his most recent scan, as two of them were not treated previously. We discussed the need for more updated 3T MRI, consultation with neurosurgery, and simulation prior to a single fraction treatment to the 4 lesions in the brain. We discussed the risks, benefits, short, and long term effects of radiotherapy, as well as the curative intent, and the patient is interested in proceeding. Dr. Mitzi Hansen discusses the delivery and logistics of radiotherapy. Written consent is obtained and placed in the chart, a copy was provided to the patient. He will simulate on 11/06/22, and receive treatment on 11/13/22. He will continue steroids as outlined with Dr. Barbaraann Cao.   In a visit lasting 60 minutes, greater than 50% of the time was spent face to face discussing the patient's condition, in preparation for the discussion, and coordinating the patient's care.   The above documentation reflects my direct findings during this shared patient visit. Please see the separate note by Dr. Mitzi Hansen on this date for the remainder of the patient's plan of care.    Osker Mason, [facility]   **Disclaimer: This note was dictated with voice recognition software.  Similar sounding words can inadvertently be transcribed and this note may contain transcription errors which may not have been corrected upon publication of note.**

## 2022-10-29 ENCOUNTER — Ambulatory Visit
Admission: RE | Admit: 2022-10-29 | Discharge: 2022-10-29 | Disposition: A | Discharge status: Home / Self Care | Payer: Medicare Other | Source: Ambulatory Visit | Attending: Radiation Oncology | Admitting: Radiation Oncology

## 2022-10-29 ENCOUNTER — Encounter: Payer: Self-pay | Admitting: Radiation Oncology

## 2022-10-29 VITALS — BP 154/77 | HR 85 | Temp 97.2°F | Resp 18 | Ht 71.0 in | Wt 224.0 lb

## 2022-10-29 DIAGNOSIS — Z7952 Long term (current) use of systemic steroids: Secondary | ICD-10-CM | POA: Insufficient documentation

## 2022-10-29 DIAGNOSIS — C7931 Secondary malignant neoplasm of brain: Secondary | ICD-10-CM | POA: Diagnosis present

## 2022-10-29 DIAGNOSIS — Z87442 Personal history of urinary calculi: Secondary | ICD-10-CM | POA: Diagnosis not present

## 2022-10-29 DIAGNOSIS — Z87891 Personal history of nicotine dependence: Secondary | ICD-10-CM | POA: Insufficient documentation

## 2022-10-29 DIAGNOSIS — I739 Peripheral vascular disease, unspecified: Secondary | ICD-10-CM | POA: Insufficient documentation

## 2022-10-29 DIAGNOSIS — Z9221 Personal history of antineoplastic chemotherapy: Secondary | ICD-10-CM | POA: Diagnosis not present

## 2022-10-29 DIAGNOSIS — J449 Chronic obstructive pulmonary disease, unspecified: Secondary | ICD-10-CM | POA: Insufficient documentation

## 2022-10-29 DIAGNOSIS — Z79890 Hormone replacement therapy (postmenopausal): Secondary | ICD-10-CM | POA: Insufficient documentation

## 2022-10-29 DIAGNOSIS — I10 Essential (primary) hypertension: Secondary | ICD-10-CM | POA: Insufficient documentation

## 2022-10-29 DIAGNOSIS — G4730 Sleep apnea, unspecified: Secondary | ICD-10-CM | POA: Insufficient documentation

## 2022-10-29 DIAGNOSIS — Z7902 Long term (current) use of antithrombotics/antiplatelets: Secondary | ICD-10-CM | POA: Insufficient documentation

## 2022-10-29 DIAGNOSIS — C3411 Malignant neoplasm of upper lobe, right bronchus or lung: Secondary | ICD-10-CM | POA: Insufficient documentation

## 2022-10-29 DIAGNOSIS — Z7982 Long term (current) use of aspirin: Secondary | ICD-10-CM | POA: Diagnosis not present

## 2022-10-29 DIAGNOSIS — R0602 Shortness of breath: Secondary | ICD-10-CM | POA: Diagnosis not present

## 2022-10-29 DIAGNOSIS — Z923 Personal history of irradiation: Secondary | ICD-10-CM | POA: Insufficient documentation

## 2022-10-29 DIAGNOSIS — C3491 Malignant neoplasm of unspecified part of right bronchus or lung: Secondary | ICD-10-CM

## 2022-10-30 LAB — BLASTOMYCES ANTIGEN: Blastomyces Antigen: NOT DETECTED ng/mL

## 2022-11-04 ENCOUNTER — Other Ambulatory Visit: Payer: Self-pay | Admitting: Radiation Oncology

## 2022-11-04 ENCOUNTER — Telehealth: Payer: Self-pay | Admitting: *Deleted

## 2022-11-04 DIAGNOSIS — C7931 Secondary malignant neoplasm of brain: Secondary | ICD-10-CM

## 2022-11-04 DIAGNOSIS — C3491 Malignant neoplasm of unspecified part of right bronchus or lung: Secondary | ICD-10-CM

## 2022-11-04 NOTE — Telephone Encounter (Signed)
CALLED PATIENT TO INFORM OF LAB APPT. FOR 11/06/22 @ 11:15 AM, SPOKE WITH PATIENT AND HE IS AWARE OF THIS APPT.

## 2022-11-04 NOTE — Progress Notes (Signed)
Has armband been applied?  {yes no:314532}  Does patient have an allergy to IV contrast dye?: {yes no:314532}   Has patient ever received premedication for IV contrast dye?: {yes no:314532}   Does patient take metformin?: {yes no:314532}  If patient does take metformin when was the last dose: {Time; dates multiple:15870}  Date of lab work: 11/06/2022 BUN: *** CR: *** eGfr: ***  IV site: {iv locations:314275}  Has IV site been added to flowsheet?  {yes no:314532}  There were no vitals taken for this visit.

## 2022-11-05 ENCOUNTER — Ambulatory Visit
Admission: RE | Admit: 2022-11-05 | Discharge: 2022-11-05 | Disposition: A | Discharge status: Home / Self Care | Payer: Medicare Other | Source: Ambulatory Visit | Attending: Radiation Oncology | Admitting: Radiation Oncology

## 2022-11-05 DIAGNOSIS — C7931 Secondary malignant neoplasm of brain: Secondary | ICD-10-CM

## 2022-11-05 MED ORDER — GADOPICLENOL 0.5 MMOL/ML IV SOLN
10.0000 mL | Freq: Once | INTRAVENOUS | Status: AC | PRN
  Administered 2022-11-05: 10 mL via INTRAVENOUS

## 2022-11-06 ENCOUNTER — Ambulatory Visit
Admission: RE | Admit: 2022-11-06 | Discharge: 2022-11-06 | Disposition: A | Discharge status: Home / Self Care | Payer: Medicare Other | Source: Ambulatory Visit | Attending: Radiation Oncology | Admitting: Radiation Oncology

## 2022-11-06 ENCOUNTER — Ambulatory Visit
Admission: RE | Admit: 2022-11-06 | Discharge: 2022-11-06 | Disposition: A | Discharge status: Home / Self Care | Payer: Medicare Other | Source: Ambulatory Visit

## 2022-11-06 ENCOUNTER — Ambulatory Visit: Admission: RE | Admit: 2022-11-06 | Payer: Medicare Other | Source: Ambulatory Visit | Admitting: Radiation Oncology

## 2022-11-06 VITALS — BP 149/78 | HR 65 | Temp 97.7°F | Resp 18 | Wt 222.8 lb

## 2022-11-06 DIAGNOSIS — Z452 Encounter for adjustment and management of vascular access device: Secondary | ICD-10-CM | POA: Insufficient documentation

## 2022-11-06 DIAGNOSIS — C7931 Secondary malignant neoplasm of brain: Secondary | ICD-10-CM

## 2022-11-06 DIAGNOSIS — Z510 Encounter for antineoplastic radiation therapy: Secondary | ICD-10-CM | POA: Diagnosis not present

## 2022-11-06 DIAGNOSIS — C3491 Malignant neoplasm of unspecified part of right bronchus or lung: Secondary | ICD-10-CM

## 2022-11-06 DIAGNOSIS — C3411 Malignant neoplasm of upper lobe, right bronchus or lung: Secondary | ICD-10-CM | POA: Diagnosis present

## 2022-11-06 LAB — BASIC METABOLIC PANEL - CANCER CENTER ONLY
Anion gap: 7 (ref 5–15)
BUN: 24 mg/dL — ABNORMAL HIGH (ref 8–23)
CO2: 27 mmol/L (ref 22–32)
Calcium: 9.4 mg/dL (ref 8.9–10.3)
Chloride: 108 mmol/L (ref 98–111)
Creatinine: 1.11 mg/dL (ref 0.61–1.24)
GFR, Estimated: >60 mL/min (ref >60)
Glucose, Bld: 126 mg/dL — ABNORMAL HIGH (ref 70–99)
Potassium: 3.9 mmol/L (ref 3.5–5.1)
Sodium: 142 mmol/L (ref 135–145)

## 2022-11-06 MED ORDER — HEPARIN SOD (PORK) LOCK FLUSH 100 UNIT/ML IV SOLN
500.0000 [IU] | Freq: Once | INTRAVENOUS | Status: AC
  Administered 2022-11-06: 500 [IU] via INTRAVENOUS

## 2022-11-06 MED ORDER — SODIUM CHLORIDE 0.9% FLUSH
10.0000 mL | Freq: Once | INTRAVENOUS | Status: AC
  Administered 2022-11-06: 10 mL via INTRAVENOUS

## 2022-11-13 ENCOUNTER — Encounter: Payer: Self-pay | Admitting: Radiation Oncology

## 2022-11-13 ENCOUNTER — Other Ambulatory Visit: Payer: Self-pay

## 2022-11-13 ENCOUNTER — Other Ambulatory Visit: Payer: Self-pay | Admitting: Radiation Oncology

## 2022-11-13 ENCOUNTER — Ambulatory Visit
Admission: RE | Admit: 2022-11-13 | Discharge: 2022-11-13 | Disposition: A | Discharge status: Home / Self Care | Payer: Medicare Other | Source: Ambulatory Visit | Attending: Radiation Oncology | Admitting: Radiation Oncology

## 2022-11-13 ENCOUNTER — Ambulatory Visit: Payer: Medicare Other | Admitting: Radiation Oncology

## 2022-11-13 DIAGNOSIS — Z510 Encounter for antineoplastic radiation therapy: Secondary | ICD-10-CM | POA: Diagnosis not present

## 2022-11-13 LAB — RAD ONC ARIA SESSION SUMMARY
Course Elapsed Days: 0
Plan Fractions Treated to Date: 1
Plan Fractions Treated to Date: 1
Plan Prescribed Dose Per Fraction: 20 Gy
Plan Prescribed Dose Per Fraction: 20 Gy
Plan Total Fractions Prescribed: 1
Plan Total Fractions Prescribed: 1
Plan Total Prescribed Dose: 20 Gy
Plan Total Prescribed Dose: 20 Gy
Reference Point Dosage Given to Date: 20 Gy
Reference Point Dosage Given to Date: 20 Gy
Reference Point Session Dosage Given: 20 Gy
Reference Point Session Dosage Given: 20 Gy
Session Number: 1

## 2022-11-13 MED ORDER — NYSTATIN 100000 UNIT/ML MT SUSP: 5.0000 mL | Freq: Four times a day (QID) | OROMUCOSAL | 0 refills | Status: DC

## 2022-11-13 NOTE — Progress Notes (Signed)
Nursing noted oral plaquing consistent with thrush while receiving ongoing steroids. Nystatin oral suspension sent to pharmacy with swish and swallow direction.

## 2022-11-13 NOTE — Progress Notes (Addendum)
  Radiation Oncology         [phone] ________________________________  Name: Joshua [name]  ZOX:[id number]  Date of Service: 11/13/22  DOB: 1956/04/13   Steroid Taper Instructions   You currently have a prescription for Dexamethasone 2 mg Tablets.    Beginning 11/17/22: Take 1/2 of a tablet (which is 1 mg) once a day  Beginning 11/24/22: Take 1/2 of a tablet (which is 1 mg) every other day and stop on 11/29/22.   Please call our office if you have any headaches, visual changes, uncontrolled movements, extremity weakness, nausea or vomiting.

## 2022-11-13 NOTE — Progress Notes (Signed)
Mr. Duris rested with [place] for 30 minutes following his SRS treatment.  Patient denies headache, dizziness, nausea, diplopia or ringing in the ears. Denies fatigue. Patient without complaints. He was given steroid taper instructions.  A prescription for nystatin solution was sent in to his pharmacy.  He understands to avoid strenuous activity for the next 24 hours and call [phone] with needs.  Ambulated out of the clinic without difficulty.  BP 137/75   Pulse 78   Temp 97.8 F (36.6 C)   Resp 18   SpO2 100%    Caylee Vlachos M. Vickii Chafe, BSN

## 2022-11-13 NOTE — Op Note (Signed)
  Name: Joshua [name]  MRN: [id number]  Date: 11/13/2022   DOB: July 24, 1955  Stereotactic Radiosurgery Operative Note  PRE-OPERATIVE DIAGNOSIS:  Multiple Brain Metastases  POST-OPERATIVE DIAGNOSIS:  Multiple Brain Metastases  PROCEDURE:  Stereotactic Radiosurgery  SURGEON:  Jadene Pierini, MD  NARRATIVE: The patient underwent a radiation treatment planning session in the radiation oncology simulation suite under the care of the radiation oncology physician and physicist.  I participated closely in the radiation treatment planning afterwards. The patient underwent planning CT which was fused to 3T high resolution MRI with 1 mm axial slices.  These images were fused on the planning system.  We contoured the gross target volumes and subsequently expanded this to yield the Planning Target Volume. I actively participated in the planning process.  I helped to define and review the target contours and also the contours of the optic pathway, eyes, brainstem and selected nearby organs at risk.  All the dose constraints for critical structures were reviewed and compared to AAPM Task Group 101.  The prescription dose conformity was reviewed.  I approved the plan electronically.    Accordingly, Joshua [name] was brought to the TrueBeam stereotactic radiation treatment linac and placed in the custom immobilization mask.  The patient was aligned according to the IR fiducial markers with BrainLab Exactrac, then orthogonal x-rays were used in ExacTrac with the 6DOF robotic table and the shifts were made to align the patient  Joshua [name] received stereotactic radiosurgery uneventfully.    Lesions treated:  5   Complex lesions treated:  0 (>3.5 cm, <22mm of optic path, or within the brainstem)   The detailed description of the procedure is recorded in the radiation oncology procedure note.  I was present for the duration of the procedure.  DISPOSITION:  Following delivery, the patient was  transported to nursing in stable condition and monitored for possible acute effects to be discharged to home in stable condition with follow-up in one month.  Jadene Pierini, MD 11/13/2022 12:35 PM

## 2022-11-14 ENCOUNTER — Ambulatory Visit: Payer: Medicare Other | Admitting: Radiation Oncology

## 2022-11-14 ENCOUNTER — Telehealth: Payer: Self-pay | Admitting: *Deleted

## 2022-11-14 DIAGNOSIS — C3491 Malignant neoplasm of unspecified part of right bronchus or lung: Secondary | ICD-10-CM

## 2022-11-14 DIAGNOSIS — C7931 Secondary malignant neoplasm of brain: Secondary | ICD-10-CM

## 2022-11-14 NOTE — Telephone Encounter (Signed)
Patient called asking if Dr Cathie Hoops plans to give him any more Immunotherapy this year. I do not see hat he has any upcoming appts

## 2022-11-17 ENCOUNTER — Telehealth: Payer: Self-pay | Admitting: Radiation Therapy

## 2022-11-17 ENCOUNTER — Other Ambulatory Visit: Payer: Self-pay | Admitting: Radiation Oncology

## 2022-11-17 ENCOUNTER — Encounter: Payer: Self-pay | Admitting: Oncology

## 2022-11-17 MED ORDER — FLUCONAZOLE 100 MG PO TABS: 100.0000 mg | ORAL_TABLET | Freq: Every day | ORAL | 0 refills | Status: DC

## 2022-11-17 NOTE — Telephone Encounter (Signed)
I returned a call to Joshua [name]. He has requested a different Rx to treat his thrush. The original liquid mouthwash is on a nationwide backorder. Laurence Aly PA-C is going to send in the oral equivalent. The pill form will interfere with his cholesterol medication. He has been instructed to stop taking his cholesterol medication while taking this and to resume after the course is complete. He expressed understanding and was thankful for the response.  Overall Joshua [name] is doing well after the completion of his brain SRS treatment. He has started the steroid taper as instructed the last day of treatment and has had no difficulty.   [name] R.T.(R)(T) Radiation Special Procedures Navigator

## 2022-11-17 NOTE — Progress Notes (Signed)
  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 He was given a steroid taper and was treated for oral thrush on the day of the treatment.   The patient will receive a call in about one month from the radiation oncology department. He will continue follow up with Dr. Timoteo Expose as well.      Osker Mason, PAC

## 2022-11-17 NOTE — Radiation Completion Notes (Signed)
Patient Name: Joshua [name], DROKE MRN: [id number] Date of Birth: 12/16/1955 Referring Physician: Elissa Hefty, M.D. Date of Service: 2022-11-17 Radiation Oncologist: Dorothy Puffer, M.D. 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, N=cN2, M=cM1 Intent: Palliative     ==========DELIVERED PLANS==========  First Treatment Date: 2022-11-13 - Last Treatment Date: 2022-11-13   Plan Name: Brain_SRS_2-4 Site: Brain Technique: SBRT/SRT-IMRT Mode: Photon Dose Per Fraction: 20 Gy Prescribed Dose (Delivered / Prescribed): 20 Gy / 20 Gy Prescribed Fxs (Delivered / Prescribed): 1 / 1   Plan Name: Brain_SRS_5 Site: Brain Technique: IMRT Mode: Photon Dose Per Fraction: 20 Gy Prescribed Dose (Delivered / Prescribed): 20 Gy / 20 Gy Prescribed Fxs (Delivered / Prescribed): 1 / 1     ==========ON TREATMENT VISIT DATES========== 2022-11-13, 2022-11-13     ==========UPCOMING VISITS==========       ==========APPENDIX - ON TREATMENT VISIT NOTES==========   See weekly On Treatment Notes is Epic for details.

## 2022-11-17 NOTE — Telephone Encounter (Signed)
Called and informed pt of plan to obtain CT. Pt verbalized understanding.   Please schedule CT ch/a/p within next 1-2 weeks and inform pt of appt.   (No to all checklist questions)

## 2022-11-25 ENCOUNTER — Ambulatory Visit
Admission: RE | Admit: 2022-11-25 | Discharge: 2022-11-25 | Disposition: A | Discharge status: Home / Self Care | Payer: Medicare Other | Source: Ambulatory Visit | Attending: Oncology | Admitting: Oncology

## 2022-11-25 DIAGNOSIS — C3491 Malignant neoplasm of unspecified part of right bronchus or lung: Secondary | ICD-10-CM | POA: Insufficient documentation

## 2022-11-25 MED ORDER — IOHEXOL 300 MG/ML  SOLN
100.0000 mL | Freq: Once | INTRAMUSCULAR | Status: AC | PRN
  Administered 2022-11-25: 100 mL via INTRAVENOUS

## 2022-11-28 ENCOUNTER — Telehealth: Payer: Self-pay

## 2022-11-28 ENCOUNTER — Other Ambulatory Visit: Payer: Self-pay | Admitting: Oncology

## 2022-11-28 DIAGNOSIS — C3491 Malignant neoplasm of unspecified part of right bronchus or lung: Secondary | ICD-10-CM

## 2022-11-28 NOTE — Telephone Encounter (Signed)
Please schedule patient for tx on 5/24, per IS. I will contact pt with appt details.

## 2022-11-28 NOTE — Telephone Encounter (Signed)
Called pt, no answer. Detailed message left on VM with appt details and Mychart message also sent.

## 2022-11-28 NOTE — Telephone Encounter (Signed)
-----   Message from [name], MD sent at 11/28/2022  8:56 AM EDT ----- CT findings are better. Please arrange him to resume treatment Around 12/12/22 lab MD durvalumab Thanks. Chemo IS updated.

## 2022-12-11 ENCOUNTER — Inpatient Hospital Stay: Payer: Medicare Other

## 2022-12-11 ENCOUNTER — Encounter: Payer: Self-pay | Admitting: Oncology

## 2022-12-11 ENCOUNTER — Inpatient Hospital Stay: Payer: Medicare Other | Attending: Oncology

## 2022-12-11 ENCOUNTER — Inpatient Hospital Stay (HOSPITAL_BASED_OUTPATIENT_CLINIC_OR_DEPARTMENT_OTHER): Payer: Medicare Other | Admitting: Oncology

## 2022-12-11 VITALS — BP 131/71 | HR 100 | Temp 97.6°F | Resp 18 | Wt 226.0 lb

## 2022-12-11 DIAGNOSIS — Z5112 Encounter for antineoplastic immunotherapy: Secondary | ICD-10-CM

## 2022-12-11 DIAGNOSIS — Z79890 Hormone replacement therapy (postmenopausal): Secondary | ICD-10-CM | POA: Insufficient documentation

## 2022-12-11 DIAGNOSIS — C7931 Secondary malignant neoplasm of brain: Secondary | ICD-10-CM | POA: Insufficient documentation

## 2022-12-11 DIAGNOSIS — C3491 Malignant neoplasm of unspecified part of right bronchus or lung: Secondary | ICD-10-CM

## 2022-12-11 DIAGNOSIS — C3411 Malignant neoplasm of upper lobe, right bronchus or lung: Secondary | ICD-10-CM | POA: Diagnosis present

## 2022-12-11 DIAGNOSIS — E032 Hypothyroidism due to medicaments and other exogenous substances: Secondary | ICD-10-CM | POA: Diagnosis not present

## 2022-12-11 LAB — COMPREHENSIVE METABOLIC PANEL
ALT: 21 U/L (ref 0–44)
AST: 25 U/L (ref 15–41)
Albumin: 3.6 g/dL (ref 3.5–5.0)
Alkaline Phosphatase: 76 U/L (ref 38–126)
Anion gap: 11 (ref 5–15)
BUN: 15 mg/dL (ref 8–23)
CO2: 24 mmol/L (ref 22–32)
Calcium: 8.8 mg/dL — ABNORMAL LOW (ref 8.9–10.3)
Chloride: 107 mmol/L (ref 98–111)
Creatinine, Ser: 1.04 mg/dL (ref 0.61–1.24)
GFR, Estimated: >60 mL/min (ref >60)
Glucose, Bld: 155 mg/dL — ABNORMAL HIGH (ref 70–99)
Potassium: 3.5 mmol/L (ref 3.5–5.1)
Sodium: 142 mmol/L (ref 135–145)
Total Bilirubin: 0.4 mg/dL (ref 0.3–1.2)
Total Protein: 6.7 g/dL (ref 6.5–8.1)

## 2022-12-11 LAB — CBC WITH DIFFERENTIAL/PLATELET
Abs Immature Granulocytes: 0.01 10*3/uL (ref 0.00–0.07)
Basophils Absolute: 0.1 10*3/uL (ref 0.0–0.1)
Basophils Relative: 1 %
Eosinophils Absolute: 0.1 10*3/uL (ref 0.0–0.5)
Eosinophils Relative: 2 %
HCT: 40.1 % (ref 39.0–52.0)
Hemoglobin: 13.1 g/dL (ref 13.0–17.0)
Immature Granulocytes: 0 %
Lymphocytes Relative: 15 %
Lymphs Abs: 0.8 10*3/uL (ref 0.7–4.0)
MCH: 28.1 pg (ref 26.0–34.0)
MCHC: 32.7 g/dL (ref 30.0–36.0)
MCV: 86.1 fL (ref 80.0–100.0)
Monocytes Absolute: 0.5 10*3/uL (ref 0.1–1.0)
Monocytes Relative: 10 %
Neutro Abs: 3.7 10*3/uL (ref 1.7–7.7)
Neutrophils Relative %: 72 %
Platelets: 204 10*3/uL (ref 150–400)
RBC: 4.66 MIL/uL (ref 4.22–5.81)
RDW: 13.4 % (ref 11.5–15.5)
WBC: 5.1 10*3/uL (ref 4.0–10.5)
nRBC: 0 % (ref 0.0–0.2)

## 2022-12-11 LAB — TSH: TSH: 23.801 u[IU]/mL — ABNORMAL HIGH (ref 0.350–4.500)

## 2022-12-11 MED ORDER — HEPARIN SOD (PORK) LOCK FLUSH 100 UNIT/ML IV SOLN
500.0000 [IU] | Freq: Once | INTRAVENOUS | Status: AC | PRN
  Administered 2022-12-11: 500 [IU]
  Filled 2022-12-11: qty 5

## 2022-12-11 MED ORDER — SODIUM CHLORIDE 0.9 % IV SOLN
Freq: Once | INTRAVENOUS | Status: AC
  Filled 2022-12-11: qty 250

## 2022-12-11 MED ORDER — SODIUM CHLORIDE 0.9 % IV SOLN
10.0000 mg/kg | Freq: Once | INTRAVENOUS | Status: AC
  Administered 2022-12-11: 1000 mg via INTRAVENOUS
  Filled 2022-12-11: qty 20

## 2022-12-11 MED ORDER — SODIUM CHLORIDE 0.9% FLUSH
10.0000 mL | INTRAVENOUS | Status: DC | PRN
  Filled 2022-12-11: qty 10

## 2022-12-11 NOTE — Patient Instructions (Signed)
[facility]  Discharge Instructions: Thank you for choosing [facility] to provide your oncology and hematology care.  If you have a lab appointment with the Cancer Center, please go directly to the Cancer Center and check in at the registration area.  Wear comfortable clothing and clothing appropriate for easy access to any Portacath or PICC line.   We strive to give you quality time with your provider. You may need to reschedule your appointment if you arrive late (15 or more minutes).  Arriving late affects you and other patients whose appointments are after yours.  Also, if you miss three or more appointments without notifying the office, you may be dismissed from the clinic at the provider's discretion.      For prescription refill requests, have your pharmacy contact our office and allow 72 hours for refills to be completed.    Today you received the following chemotherapy and/or immunotherapy agents Durvalumab       To help prevent nausea and vomiting after your treatment, we encourage you to take your nausea medication as directed.  BELOW ARE SYMPTOMS THAT SHOULD BE REPORTED IMMEDIATELY: *FEVER GREATER THAN 100.4 F (38 C) OR HIGHER *CHILLS OR SWEATING *NAUSEA AND VOMITING THAT IS NOT CONTROLLED WITH YOUR NAUSEA MEDICATION *UNUSUAL SHORTNESS OF BREATH *UNUSUAL BRUISING OR BLEEDING *URINARY PROBLEMS (pain or burning when urinating, or frequent urination) *BOWEL PROBLEMS (unusual diarrhea, constipation, pain near the anus) TENDERNESS IN MOUTH AND THROAT WITH OR WITHOUT PRESENCE OF ULCERS (sore throat, sores in mouth, or a toothache) UNUSUAL RASH, SWELLING OR PAIN  UNUSUAL VAGINAL DISCHARGE OR ITCHING   Items with * indicate a potential emergency and should be followed up as soon as possible or go to the Emergency Department if any problems should occur.  Please show the CHEMOTHERAPY ALERT CARD or IMMUNOTHERAPY ALERT CARD at check-in  to the Emergency Department and triage nurse.  Should you have questions after your visit or need to cancel or reschedule your appointment, please contact [facility]  [phone] and follow the prompts.  Office hours are 8:00 a.m. to 4:30 p.m. Monday - Friday. Please note that voicemails left after 4:00 p.m. may not be returned until the following business day.  We are closed weekends and major holidays. You have access to a nurse at all times for urgent questions. Please call the main number to the clinic [phone] and follow the prompts.  For any non-urgent questions, you may also contact your provider using MyChart. We now offer e-Visits for anyone 1 and older to request care online for non-urgent symptoms. For details visit [email or web address].   Also download the MyChart app! Go to the app store, search "MyChart", open the app, select [facility], and log in with your MyChart username and password.  Masks are optional in the cancer centers. If you would like for your care team to wear a mask while they are taking care of you, please let them know. For doctor visits, patients may have with them one support person who is at least 65 years old. At this time, visitors are not allowed in the infusion area.

## 2022-12-11 NOTE — Assessment & Plan Note (Signed)
Durvalumab treatment as planned.

## 2022-12-11 NOTE — Assessment & Plan Note (Signed)
Recommend patient to take Calcium 1200mg daily.  

## 2022-12-11 NOTE — Assessment & Plan Note (Signed)
Status post radiation,  02/19/22 MRI showed decreased brain lesion size and edema.  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 small new left anterior cerebellar 4-5 mm metastasis  March 2024  MRI brain Interval increase in size of multiple intraparenchymal lesions with worsened surrounding vasogenic edema- resumed on Dexamethasone 2mg  daily.  Neurology Dr. Barbaraann Cao recommends radiation treatments due to increase size of these lesions.  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 Mitzi Hansen.

## 2022-12-11 NOTE — Assessment & Plan Note (Signed)
Continue Synthroid 100 mcg daily

## 2022-12-11 NOTE — Progress Notes (Signed)
Hematology/Oncology Progress note [phone] [phone]    CHIEF COMPLAINTS/REASON FOR VISIT:  Follow up for lung cancer   ASSESSMENT & PLAN:   Cancer Staging  Squamous cell carcinoma of right lung (HCC) Staging form: Lung, AJCC 8th Edition - Clinical stage from 09/20/2020: Stage IV (cT1b, cN2, cM1) - Signed by [name], MD on 12/13/2021   Squamous cell carcinoma of right lung (HCC) Durvalumab was previously held due to pneumonitis.  May 2024 CT scan showed Improving bilateral scattered interstitial and ground-glass opacities. Discussed with pulmonology Dr. Jayme Cloud. 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 Proceed with Durvalumab.    Malignant neoplasm metastatic to brain [facility]) Status post radiation,  02/19/22 MRI showed decreased brain lesion size and edema.  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 small new left anterior cerebellar 4-5 mm metastasis  March 2024  MRI brain Interval increase in size of multiple intraparenchymal lesions with worsened surrounding vasogenic edema- resumed on Dexamethasone 2mg  daily.  Neurology Dr. Barbaraann Cao recommends radiation treatments due to increase size of these lesions.  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 Mitzi Hansen.    Hypothyroidism due to medication Continue Synthroid 100 mcg daily   Hypocalcemia Recommend patient to take Calcium 1200mg  daily.   Encounter for antineoplastic immunotherapy Durvalumab treatment as planned.      Orders Placed This Encounter  Procedures   T4    Standing Status:   Future    Standing Expiration Date:   12/25/2023   TSH    Standing Status:   Future    Standing Expiration Date:   12/25/2023   CBC with Differential    Standing Status:   Future    Standing Expiration Date:   12/25/2023   Comprehensive metabolic panel    Standing Status:    Future    Standing Expiration Date:   12/25/2023    Follow up 2 weeks lab MD Durvalumab All questions were answered. The patient knows to call the clinic with any problems, questions or concerns.  [name], MD, PhD [facility] Hematology Oncology 12/11/2022       HISTORY OF PRESENTING ILLNESS:  Joshua [name] is a 67 y.o. male who has above history reviewed by me today presents for follow up visit for Stage IV lung squamous cell carcinoma. Oncology History  Squamous cell carcinoma of right lung (HCC)  09/18/2020 Imaging   PET scan showed right upper lobe nodule maximal SUV 11.5.  Hypermetabolic cluster right paratracheal adenopathy with maximum SUV up to 12.  No findings of metastatic disease to the neck/abdomen/pelvis or skeleton.   09/20/2020 Initial Diagnosis   Squamous cell carcinoma of right lung (HCC) NGS showed PD-L1 TPS 95%, LRP1B S926fs, PIK3CA E545K, RB1 c1422-2A>T, RIT1 T83_ A84del, STK11 P258fs, TPS53 R248W, TMB 19.23mut/mb, MS stable.    09/20/2020 Cancer Staging   Staging form: Lung, AJCC 8th Edition - Clinical stage from 09/20/2020: Stage IV (cT1b, cN2, cM1) - Signed by [name], MD on 12/13/2021   10/03/2020 Imaging   MRI brain with and without contrast showed no evidence of intracranial metastatic disease. Well-defined round lesion within the left parotid tail measuring approximately 2.3 x 1.8 x 1.8 cm   10/09/2020 - 11/27/2020 Radiation Therapy   concurrent chemoradiation   10/10/2020 - 10/31/2020 Chemotherapy   10/10/2020 concurrent chemoradiation.  carboplatin AUC of 2 and Taxol 45 mg/m2 x 4.  Additional chemotherapy  was held due to thrombocytopenia, neutropenia       12/31/2020 - 03/21/2022 Chemotherapy   Maintenance  Durvalumab q14d      12/31/2020 -  Chemotherapy   Patient is on Treatment Plan : LUNG Durvalumab (10) q14d     03/28/2021 Imaging   CT without contrast showed new linear right perihilar consolidation with associated traction bronchiectasis.  Likely  postradiation changes.  Additional new peribronchial vascular/nodular groundglass opacities with areas of consolidation or seen primarily in the right upper and lower lobes, more distant from expected radiation fields. 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  Per my discussion with pulmonology Dr. 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     05/09/2021 Imaging   Repeat CT chest without contrast showed evolving radiation changes in the right perihilar region likely accounting for interval increase to thickening of the posterior wall of the right upper lobe bronchus.  The more peripheral right lung groundglass opacities seen on most recent study have generally improved with exception of one peripherally right upper lobe which appears new.  These are likely inflammatory.  No findings highly suspicious for local recurrence.   05/30/2021 - 01/10/2022 Chemotherapy   Durvalumab q14d     10/07/2021 Imaging   MRI brain with and without contrast showed 2 contrast-enhancing lesions, most consistent with metastatic disease.  Largest lesion is in the left frontal lobe and measures up to 1.3 cm with a large amount of surrounding edema and 9 mm rightward midline shift.  The other enhancing lesion was 4 mm in the right occipital lobe with a small amount of surrounding edema.  No acute or chronic hemorrhage.   10/07/2021 Progression   Headache after colonoscopy, progressive difficulty using her right arm.  Progressively worsening of speech. Stage IV lung cancer with brain metastasis.  started on Dexamethasone.  He was seen by neurosurgery and radiation oncology.  Recommendation is to proceed with SRS.  Patient was discharged with dexamethasone   10/16/2021 Imaging   PET scan showed postradiation changes in the right supra hilar lung without evidence of local recurrence.  No evidence of  lung cancer recurrence or metastatic disease.  Single focus of intense metabolic activity associated with posterior left 11th rib likely secondary to recent fall/posttraumatic metabolic changes.  Frontal  uptake in the left frontal lobe corresponding to the known metastatic lesion in the brain   10/29/2021 -  Radiation Therapy   Brain Radiation [facility]   12/10/2021 Imaging   Brain MRI: Continued interval decrease in size of a left frontal lobe metastasis, now measuring 10 mm. Moderate surrounding edema has also decreased.  3-4 mm metastasis within the right occipital lobe, not significantly changed in size. There is now a small focus of central non enhancement within this lesion. Increased edema. 3 mm enhancing metastasis within the high left parietal lobe. In retrospect, this was likely present as a subtle punctate focus of enhancement on the prior brain MRI of 10/22/2021.   01/18/2022 Imaging   CT chest abdomen pelvis w contrast 1. Stable post treatment changes in the right upper lobe medially with dense radiation fibrosis but no findings to suggest local recurrent tumor. 2. Significant progression of ill-defined ground-glass opacity, interstitial thickening and airspace nodularity in the right upper lobe and upper aspect of the right lower lobe. Findings could reflect an inflammatory or atypical infectious process, drug induced pneumonitis or interstitial spread of  tumor.3. No mediastinal or hilar mass or adenopathy. 4. No findings for abdominal/pelvic metastatic disease or osseous metastatic disease.   02/19/2022 Imaging   MRI brain w wo contrast showed Three metastatic deposits in the brain appear smaller. Decreased edema in the left parietal lobe. No new lesions    04/04/2022 Imaging   CT chest w contrast 1. Stable radiation changes in the right perihilar region without evidence of local recurrence. 2. Interval improvement in the patchy ground-glass opacities previously demonstrated in both lungs,  most consistent with a resolving infectious or inflammatory process (including immunotherapy-related pneumonitis). 3. No evidence of metastatic disease.   05/14/2022 Miscellaneous   He had a mechanical fall and fractured his left elbow. xrays showed a left elbow fracture  05/26/22 seen by orthopedic surgeon, cast applied.    05/27/2022 Imaging   MRI brain w wo contrast 1. All three known brain metastases have enlarged since August, with increasing regional edema. And there is a small new left anterior cerebellar 4-5 mm metastasis (was questionably punctate on the May MRI). This constellation could reflect a combination of True Progression (Left Cerebellar lesion) and pseudoprogression/radionecrosis (3 existing mets).    2. No significant intracranial mass effect. 3. Underlying moderate cerebral white matter signal changes.   05/30/2022 Imaging   CT chest abdomen pelvis w contrast  1. 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 There is a stable small right pleural effusion, but no definitive findings to suggest residual or recurrent disease on today's examination. 2. No signs of metastatic disease in the abdomen or pelvis.  3. Aortic atherosclerosis, in addition to two-vessel coronary artery disease. Please note that although the presence of coronary artery calcium documents the presence of coronary artery disease, the severity of this disease and any potential stenosis cannot be assessed on this non-gated CT examination. Assessment for potential risk factor modification, dietary therapy or pharmacologic therapy may be warranted, if clinically indicated. 4. Hepatic steatosis. 5. Colonic diverticulosis without evidence of acute diverticulitis at this time. 6. Incompletely imaged but apparently occluded vascular graft in the superior aspect of the right anterior thigh. Referral  to vascular specialist for further clinical evaluation should be considered if clinically appropriate. 7. Additional incidental findings, as above.   07/22/2022 Imaging   MRI brain  1. No new or enlarging lesions. No intracranial disease progression. 2. Decreased size of treated lesions of the left frontoparietal junction and left postcentral gyrus. 3. Unchanged lesions in the left cerebellum and right occipital lobe.   10/15/2022 Imaging   MRI brain w wo contrast showed Interval increase in size of multiple intraparenchymal lesions with worsened surrounding vasogenic edema. 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 No new lesions   11/05/2022 Imaging   MRI brain w wo contrast  1. Likely slight increase and right occipital and left cerebellar lesions. Otherwise, unchanged metastases, as above. No new lesions identified. 2. Partially imaged large probable lymph node in the left upper neck, apparent on prior PET CT.   11/13/2022 -  Radiation Therapy   Each of the sites were treated with SRS/SBRT/SRT-IMRT technique to 20 Gy in 1 fraction to each of the following sites: PTV_2_LCerebel_60mm PTV_3_RCerebel_3mm PTV_4_ROccip_57mm PTV_5_LParietal_66mm    Today he feels well.Intermittent headache, is better. denies any focal deficits. chronic SOB,not worse.  He has finished tapering course of steroid.  + weight gain.  occational cough  Review of Systems  Constitutional:  Positive for fatigue. Negative for appetite change, chills, fever and unexpected weight change.  HENT:   Negative for hearing loss and voice change.   Eyes:  Negative for eye problems and icterus.  Respiratory:  Negative for chest tightness, cough and shortness of breath.   Cardiovascular:  Negative for chest pain and leg swelling.  Gastrointestinal:  Negative for abdominal distention and abdominal pain.  Endocrine: Negative for hot flashes.   Genitourinary:  Negative for difficulty urinating, dysuria and frequency.   Musculoskeletal:  Negative for arthralgias.  Skin:  Negative for itching and rash.  Neurological:  Positive for extremity weakness. Negative for headaches, light-headedness, numbness and speech difficulty.  Hematological:  Negative for adenopathy. Does not bruise/bleed easily.  Psychiatric/Behavioral:  Negative for confusion.       MEDICAL HISTORY:  Past Medical History:  Diagnosis Date   Allergy    Cancer (HCC)    lung   COPD (chronic obstructive pulmonary disease) (HCC)    Dyspnea    former smoker. only doe   History of kidney stones    Hypertension    Peripheral vascular disease (HCC)    Pre-diabetes    Sleep apnea     SURGICAL HISTORY: Past Surgical History:  Procedure Laterality Date   ANTERIOR CERVICAL DECOMP/DISCECTOMY FUSION N/A 03/14/2020   Procedure: ANTERIOR CERVICAL DECOMPRESSION/DISCECTOMY FUSION 3 LEVELS C4-7;  Surgeon: Venetia Night, MD;  Location: [facility] ORS;  Service: Neurosurgery;  Laterality: N/A;   BACK SURGERY  2011   CENTRAL LINE INSERTION Right 09/10/2016   Procedure: CENTRAL LINE INSERTION;  Surgeon: Renford Dills, MD;  Location: [facility] ORS;  Service: Vascular;  Laterality: Right;   CERVICAL FUSION     C4-c7 on Mar 14, 2020   COLONOSCOPY  2013 ?   COLONOSCOPY N/A 09/09/2021   Procedure: COLONOSCOPY;  Surgeon: Jaynie Collins, DO;  Location: [facility] ENDOSCOPY;  Service: Gastroenterology;  Laterality: N/A;   CORONARY ANGIOPLASTY     ESOPHAGOGASTRODUODENOSCOPY N/A 09/09/2021   Procedure: ESOPHAGOGASTRODUODENOSCOPY (EGD);  Surgeon: Jaynie Collins, DO;  Location: [facility] ENDOSCOPY;  Service: Gastroenterology;  Laterality: N/A;   FEMORAL-POPLITEAL BYPASS GRAFT Right 09/10/2016   Procedure: BYPASS GRAFT FEMORAL-POPLITEAL ARTERY ( BELOW KNEE );  Surgeon: Renford Dills, MD;  Location: [facility] ORS;  Service: Vascular;  Laterality: Right;   FEMORAL-TIBIAL BYPASS GRAFT Right  03/23/2020   Procedure: BYPASS GRAFT RIGHT FEMORAL- DISTAL TIBIAL ARTERY WITH DISTAL FLOW GRAFT;  Surgeon: Renford Dills, MD;  Location: [facility] ORS;  Service: Vascular;  Laterality: Right;   LEFT HEART CATH AND CORONARY ANGIOGRAPHY Left 05/16/2021   Procedure: LEFT HEART CATH AND CORONARY ANGIOGRAPHY;  Surgeon: Armando Reichert, MD;  Location: [facility] INVASIVE CV LAB;  Service: Cardiovascular;  Laterality: Left;   LOWER EXTREMITY ANGIOGRAPHY Right 11/18/2016   Procedure: Lower Extremity Angiography;  Surgeon: Renford Dills, MD;  Location: [facility] INVASIVE CV LAB;  Service: Cardiovascular;  Laterality: Right;   LOWER EXTREMITY ANGIOGRAPHY Right 12/09/2016   Procedure: Lower Extremity Angiography;  Surgeon: Renford Dills, MD;  Location: [facility] INVASIVE CV LAB;  Service: Cardiovascular;  Laterality: Right;   LOWER EXTREMITY ANGIOGRAPHY Right 11/15/2019   Procedure: LOWER EXTREMITY ANGIOGRAPHY;  Surgeon: Renford Dills, MD;  Location: [facility] INVASIVE CV LAB;  Service: Cardiovascular;  Laterality: Right;   LOWER EXTREMITY ANGIOGRAPHY Right 12/01/2019   Procedure: Lower Extremity Angiography;  Surgeon: Annice Needy, MD;  Location: [facility] INVASIVE CV LAB;  Service: Cardiovascular;  Laterality: Right;  LOWER EXTREMITY ANGIOGRAPHY Right 12/02/2019   Procedure: LOWER EXTREMITY ANGIOGRAPHY;  Surgeon: Renford Dills, MD;  Location: [facility] INVASIVE CV LAB;  Service: Cardiovascular;  Laterality: Right;   LOWER EXTREMITY ANGIOGRAPHY Right 03/23/2020   Procedure: Lower Extremity Angiography;  Surgeon: Renford Dills, MD;  Location: [facility] INVASIVE CV LAB;  Service: Cardiovascular;  Laterality: Right;   LOWER EXTREMITY ANGIOGRAPHY Right 09/25/2021   Procedure: Lower Extremity Angiography;  Surgeon: Renford Dills, MD;  Location: [facility] INVASIVE CV LAB;  Service: Cardiovascular;  Laterality: Right;   LOWER EXTREMITY ANGIOGRAPHY Right 09/26/2021   Procedure: Lower Extremity Angiography;  Surgeon: Annice Needy, MD;   Location: [facility] INVASIVE CV LAB;  Service: Cardiovascular;  Laterality: Right;   LOWER EXTREMITY ANGIOGRAPHY Right 04/08/2022   Procedure: Lower Extremity Angiography;  Surgeon: Renford Dills, MD;  Location: [facility] INVASIVE CV LAB;  Service: Cardiovascular;  Laterality: Right;   LOWER EXTREMITY INTERVENTION  11/18/2016   Procedure: Lower Extremity Intervention;  Surgeon: Renford Dills, MD;  Location: [facility] INVASIVE CV LAB;  Service: Cardiovascular;;   PERIPHERAL VASCULAR CATHETERIZATION Right 01/02/2015   Procedure: Lower Extremity Angiography;  Surgeon: Renford Dills, MD;  Location: [facility] INVASIVE CV LAB;  Service: Cardiovascular;  Laterality: Right;   PERIPHERAL VASCULAR CATHETERIZATION Right 06/18/2015   Procedure: Lower Extremity Angiography;  Surgeon: Annice Needy, MD;  Location: [facility] INVASIVE CV LAB;  Service: Cardiovascular;  Laterality: Right;   PERIPHERAL VASCULAR CATHETERIZATION  06/18/2015   Procedure: Lower Extremity Intervention;  Surgeon: Annice Needy, MD;  Location: [facility] INVASIVE CV LAB;  Service: Cardiovascular;;   PERIPHERAL VASCULAR CATHETERIZATION N/A 10/09/2015   Procedure: Abdominal Aortogram w/Lower Extremity;  Surgeon: Renford Dills, MD;  Location: [facility] INVASIVE CV LAB;  Service: Cardiovascular;  Laterality: N/A;   PERIPHERAL VASCULAR CATHETERIZATION  10/09/2015   Procedure: Lower Extremity Intervention;  Surgeon: Renford Dills, MD;  Location: [facility] INVASIVE CV LAB;  Service: Cardiovascular;;   PERIPHERAL VASCULAR CATHETERIZATION Right 10/10/2015   Procedure: Lower Extremity Angiography;  Surgeon: Annice Needy, MD;  Location: [facility] INVASIVE CV LAB;  Service: Cardiovascular;  Laterality: Right;   PERIPHERAL VASCULAR CATHETERIZATION Right 10/10/2015   Procedure: Lower Extremity Intervention;  Surgeon: Annice Needy, MD;  Location: [facility] INVASIVE CV LAB;  Service: Cardiovascular;  Laterality: Right;   PERIPHERAL VASCULAR CATHETERIZATION Right 12/03/2015   Procedure: Lower  Extremity Angiography;  Surgeon: Annice Needy, MD;  Location: [facility] INV[facility] LAB;  Service: Cardiovascular;  Laterality: Right;   PERIPHERAL VASCULAR CATHETERIZATION Right 12/04/2015   Procedure: Lower Extremity Angiography;  Surgeon: Annice Needy, MD;  Location: [facility] INVASIVE CV LAB;  Service: Cardiovascular;  Laterality: Right;   PERIPHERAL VASCULAR CATHETERIZATION  12/04/2015   Procedure: Lower Extremity Intervention;  Surgeon: Annice Needy, MD;  Location: [facility] INVASIVE CV LAB;  Service: Cardiovascular;;   PERIPHERAL VASCULAR CATHETERIZATION Right 06/30/2016   Procedure: Lower Extremity Angiography;  Surgeon: Annice Needy, MD;  Location: [facility] INVASIVE CV LAB;  Service: Cardiovascular;  Laterality: Right;   PERIPHERAL VASCULAR CATHETERIZATION Right 06/25/2016   Procedure: Lower Extremity Angiography;  Surgeon: Renford Dills, MD;  Location: [facility] INVAS[facility] CV LAB;  Service: Cardiovascular;  Laterality: Right;   PERIPHERAL VASCULAR CATHETERIZATION Right 07/01/2016   Procedure: Lower Extremity Angiography;  Surgeon: Renford Dills, MD;  Location: [facility] INVASIVE CV LAB;  Service: Cardiovascular;  Laterality: Right;   PERIPHERAL VASCULAR CATHETERIZATION  07/01/2016   Procedure: Lower Extremity Intervention;  Surgeon: Renford Dills, MD;  Location: [facility]  INVASIVE CV LAB;  Service: Cardiovascular;;   PERIPHERAL VASCULAR CATHETERIZATION Right 08/01/2016   Procedure: Lower Extremity Angiography;  Surgeon: Renford Dills, MD;  Location: [facility] INVASIVE CV LAB;  Service: Cardiovascular;  Laterality: Right;   PORTA CATH INSERTION N/A 10/05/2020   Procedure: PORTA CATH INSERTION;  Surgeon: Renford Dills, MD;  Location: [facility] INVASIVE CV LAB;  Service: Cardiovascular;  Laterality: N/A;   stent placement in right leg Right    VIDEO BRONCHOSCOPY N/A 09/14/2020   Procedure: VIDEO BRONCHOSCOPY WITH FLUORO;  Surgeon: Salena Saner, MD;  Location: [facility] ORS;  Service: Cardiopulmonary;  Laterality: N/A;    VIDEO BRONCHOSCOPY WITH ENDOBRONCHIAL ULTRASOUND N/A 09/14/2020   Procedure: VIDEO BRONCHOSCOPY WITH ENDOBRONCHIAL ULTRASOUND;  Surgeon: Salena Saner, MD;  Location: [facility] ORS;  Service: Cardiopulmonary;  Laterality: N/A;    SOCIAL HISTORY: Social History   Socioeconomic History   Marital status: Single    Spouse name: Not on file   Number of children: Not on file   Years of education: Not on file   Highest education level: Not on file  Occupational History   Occupation: unemployed  Tobacco Use   Smoking status: Former    Packs/day: 2.00    Years: 38.00    Additional pack years: 0.00    Total pack years: 76.00    Types: Cigarettes    Quit date: 12/20/2014    Years since quitting: 7.9   Smokeless tobacco: Never   Tobacco comments:    quit smoking in 2017 most smoked 2   Vaping Use   Vaping Use: Former  Substance and Sexual Activity   Alcohol use: Not Currently   Drug use: No   Sexual activity: Yes  Other Topics Concern   Not on file  Social History Narrative   Not on file   Social Determinants of Health   Financial Resource Strain: Not on file  Food Insecurity: Not on file  Transportation Needs: Not on file  Physical Activity: Not on file  Stress: Not on file  Social Connections: Not on file  Intimate Partner Violence: Not on file    FAMILY HISTORY: Family History  Problem Relation Age of Onset   Diabetes Mother    Hypertension Mother    Heart murmur Mother    Leukemia Mother    Throat cancer Maternal Grandmother    Colon cancer Neg Hx    Breast cancer Neg Hx     ALLERGIES:  has No Known Allergies.  MEDICATIONS:  Current Outpatient Medications  Medication Sig Dispense Refill   aspirin EC 81 MG tablet Take 81 mg by mouth daily. Swallow whole.     clopidogrel (PLAVIX) 75 MG tablet TAKE 1 TABLET BY MOUTH DAILY 100 tablet 2   clopidogrel (PLAVIX) 75 MG tablet Take by mouth.     fluticasone (FLONASE) 50 MCG/ACT nasal spray Place 1 spray into both  nostrils 2 (two) times daily. 15.8 mL 2   levothyroxine (SYNTHROID) 100 MCG tablet TAKE 1 TABLET BY MOUTH ONCE DAILY BEFORE BREAKFAST 90 tablet 0   simvastatin (ZOCOR) 40 MG tablet Take 40 mg by mouth at bedtime.     No current facility-administered medications for this visit.   Facility-Administered Medications Ordered in Other Visits  Medication Dose Route Frequency Provider Last Rate Last Admin   albuterol (PROVENTIL) (2.5 MG/3ML) 0.083% nebulizer solution 2.5 mg  2.5 mg Nebulization Once Salena Saner, MD       sodium chloride flush (NS) 0.9 % injection 10  mL  10 mL Intracatheter PRN [name], MD         PHYSICAL EXAMINATION: ECOG PERFORMANCE STATUS: 1 - Symptomatic but completely ambulatory Vitals:   12/11/22 0913  BP: 131/71  Pulse: 100  Resp: 18  Temp: 97.6 F (36.4 C)  SpO2: 100%   Filed Weights   12/11/22 0913  Weight: 226 lb (102.5 kg)    Physical Exam Constitutional:      General: He is not in acute distress.    Appearance: He is obese. He is not diaphoretic.     Comments: Patient walks with a cane  HENT:     Head: Normocephalic and atraumatic.  Eyes:     General: No scleral icterus. Cardiovascular:     Rate and Rhythm: Normal rate and regular rhythm.     Heart sounds: No murmur heard. Pulmonary:     Effort: Pulmonary effort is normal. No respiratory distress.  Abdominal:     General: There is no distension.     Palpations: Abdomen is soft.     Tenderness: There is no abdominal tenderness.  Musculoskeletal:     Cervical back: Normal range of motion and neck supple.  Skin:    General: Skin is warm and dry.     Findings: No erythema.  Neurological:     Mental Status: He is alert and oriented to person, place, and time. Mental status is at baseline.     Cranial Nerves: No cranial nerve deficit.     Motor: No abnormal muscle tone.  Psychiatric:        Mood and Affect: Mood and affect normal.      LABORATORY DATA:  I have reviewed the data as  listed    Latest Ref Rng & Units 12/11/2022    8:50 AM 08/29/2022   10:08 AM 08/15/2022    8:47 AM  CBC  WBC 4.0 - 10.5 K/uL 5.1  3.9  4.6   Hemoglobin 13.0 - 17.0 g/dL 91.4  78.2  95.6   Hematocrit 39.0 - 52.0 % 40.1  38.3  38.5   Platelets 150 - 400 K/uL 204  224  163       Latest Ref Rng & Units 12/11/2022    8:50 AM 11/06/2022   10:41 AM 08/29/2022   10:08 AM  CMP  Glucose 70 - 99 mg/dL 213  086  578   BUN 8 - 23 mg/dL 15  24  15    Creatinine 0.61 - 1.24 mg/dL 4.69  6.29  5.28   Sodium 135 - 145 mmol/L 142  142  138   Potassium 3.5 - 5.1 mmol/L 3.5  3.9  3.6   Chloride 98 - 111 mmol/L 107  108  102   CO2 22 - 32 mmol/L 24  27  25    Calcium 8.9 - 10.3 mg/dL 8.8  9.4  8.9   Total Protein 6.5 - 8.1 g/dL 6.7   6.9   Total Bilirubin 0.3 - 1.2 mg/dL 0.4   0.4   Alkaline Phos 38 - 126 U/L 76   72   AST 15 - 41 U/L 25   29   ALT 0 - 44 U/L 21   26      RADIOGRAPHIC STUDIES: I have personally reviewed the radiological images as listed and agreed with the findings in the report. CT CHEST ABDOMEN PELVIS W CONTRAST  Result Date: 11/27/2022 CLINICAL DATA:  Right-sided lung cancer, squamous cell carcinoma. Staging. * Tracking Code: BO *  EXAM: CT CHEST, ABDOMEN, AND PELVIS WITH CONTRAST TECHNIQUE: Multidetector CT imaging of the chest, abdomen and pelvis was performed following the standard protocol during bolus administration of intravenous contrast. RADIATION DOSE REDUCTION: This exam was performed according to the departmental dose-optimization program which includes automated exposure control, adjustment of the mA and/or kV according to patient size and/or use of iterative reconstruction technique. CONTRAST:  OMNIPAQUE IOHEXOL 300 MG/ML  SOLN COMPARISON:  CT 08/25/2022 and older FINDINGS: CT CHEST FINDINGS Cardiovascular: Right upper chest port. Heart is nonenlarged. No pericardial effusion. Slight pericardial thickening. The thoracic aorta has a normal course and caliber.  Mediastinum/Nodes: Slightly patulous thoracic esophagus. Small thyroid gland. No specific abnormal lymph node enlargement seen in the axillary regions, hilum or mediastinum. Lungs/Pleura: Left lung is without consolidation, pneumothorax or effusion. Previously there was some patchy ground-glass in the left lung with some reticular changes which has significantly improved. There is some subtle changes remaining in the left lower lobe dependently. The specific areas are increased from previous but overall the amount in the left lung is markedly improved. The right lung demonstrates some volume loss with thickening and bronchiectasis with some distortion right perihilar involving the right upper lobe which could be posttreatment changes. There are some changes as well there are similar and stable along the superior segment of the right lower lobe. No right-sided new consolidation, pneumothorax or effusion. No new dominant nodule. The previous reticulonodular changes in the right lung on the prior have also substantially improved. Musculoskeletal: Fixation hardware seen along the lower cervical spine at the edge of the imaging field. Scattered endplate osteophytes are seen throughout the thoracic spine with multilevel stenosis. Old left-sided lateral rib fractures inferiorly. CT ABDOMEN PELVIS FINDINGS Hepatobiliary: Mild fatty liver infiltration. The gallbladder is nondilated. Patent portal vein. No space-occupying liver lesion. Pancreas: Unremarkable. No pancreatic ductal dilatation or surrounding inflammatory changes. Spleen: Normal in size without focal abnormality. Adrenals/Urinary Tract: The adrenal glands are preserved. Mild bilateral renal atrophy. No enhancing renal mass or collecting system dilatation. The ureters have normal course and caliber down to the bladder. Preserved contours of the urinary bladder. Stomach/Bowel: On this non oral contrast exam, large bowel has a normal course and caliber. Scattered  stool. Left-sided colonic diverticulosis. Normal appendix in the right lower quadrant. There is some debris in the stomach. Small bowel is nondilated. Vascular/Lymphatic: Aortic atherosclerosis. No enlarged abdominal or pelvic lymph nodes. There are areas of stenosis suggested along the internal iliac vessels. At the edge of the imaging field are evidence of bypass grafts along the right thigh which appears occluded. SFA appears occluded. Please correlate with clinical history. Stable prominent right inguinal node on series 2, image 142. No retroperitoneal or other abdominal nodal enlargement. Reproductive: Prostate is unremarkable. Other: No abdominal wall hernia or abnormality. No abdominopelvic ascites. Musculoskeletal: Degenerative changes seen of the spine and pelvis. There is streak artifact related to the fixation hardware at the lumbosacral spine. IMPRESSION: Improving bilateral scattered interstitial and ground-glass opacities. Small amount of residual areas in the extreme left lower lobe inferiorly. This specific area is slightly increased from previous but overall the amount of opacities is significantly improved. Recommend continued follow-up. No developing new mass lesion, fluid collection or lymph node enlargement. Stable posttreatment changes right perihilar in the lung. Fatty liver infiltration. Colonic diverticulosis. Improved tiny right pleural effusion Electronically Signed   By: Karen Kays M.D.   On: 11/27/2022 16:15   MR Brain W Wo Contrast  Result Date: 11/06/2022 CLINICAL  DATA:  Brain metastases, assess treatment response 3T SRS Protocol for radiation treatment planning. EXAM: MRI HEAD WITHOUT AND WITH CONTRAST TECHNIQUE: Multiplanar, multiecho pulse sequences of the brain and surrounding structures were obtained without and with intravenous contrast. CONTRAST:  10 mL of Vueway COMPARISON:  MRI head October 14, 2022. FINDINGS: Brain: Similar size of proximally 21 mm peripherally enhancing  lesion in the left frontal lobe (series 13, image 113) and 14 mm peripherally enhancing lesion in the left parietal lobe (series 13, image 144). Likely slight increase of 17 mm lesion the right occipital lobe (series 13, image 49). Likely slight increase in 7 mm enhancing lesion left cerebellum. Similar small right cerebellar lesion (image 44). No evidence of acute infarct, acute hemorrhage, midline shift or hydrocephalus. Vascular: Major arterial flow voids are maintained at the skull base. Skull and upper cervical spine: Normal marrow signal. Sinuses/Orbits: Largely clear sinuses.  No acute orbital findings. Other: Partially imaged probable large lymph node in the left upper neck. IMPRESSION: 1. Likely slight increase and right occipital and left cerebellar lesions. Otherwise, unchanged metastases, as above. No new lesions identified. 2. Partially imaged large probable lymph node in the left upper neck, apparent on prior PET CT. Electronically Signed   By: Feliberto Harts M.D.   On: 11/06/2022 10:24   MR BRAIN W WO CONTRAST  Result Date: 10/15/2022 CLINICAL DATA:  CNS neoplasm. SRS treatment follow-up. Weakness and difficulty walking. EXAM: MRI HEAD WITHOUT AND WITH CONTRAST TECHNIQUE: Multiplanar, multiecho pulse sequences of the brain and surrounding structures were obtained without and with intravenous contrast. CONTRAST:  9mL GADAVIST GADOBUTROL 1 MMOL/ML IV SOLN COMPARISON:  07/18/2022 FINDINGS: Brain: No acute infarct or hemorrhage. Small amount of chronic blood associated with lesion of the left frontal operculum. There are multiple intraparenchymal lesions: 1. Left cerebellum, 5 mm, previously 2 mm (series 1022, image 144) 2. Right cerebellum, 3 mm, unchanged, image 131 3. Right occipital lobe, 17 mm, previously 8 mm, image 125 4. Left frontal operculum d, 23 mm, previously 20 mm, image 73 5. Left postcentral gyrus 13 mm, previously 7 mm, image 36 All of these lesions demonstrate a ring enhancement  type pattern. Surrounding vasogenic edema has worsened at all lesion sites Vascular: Normal flow voids. Skull and upper cervical spine: Normal marrow signal. Sinuses/Orbits: Negative. Other: None. IMPRESSION: Interval increase in size of multiple intraparenchymal lesions with worsened surrounding vasogenic edema. 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 No new lesions. Electronically Signed   By: Deatra Robinson M.D.   On: 10/15/2022 22:11

## 2022-12-11 NOTE — Assessment & Plan Note (Addendum)
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  May 2024 CT scan showed Improving bilateral scattered interstitial and ground-glass opacities. Discussed with pulmonology Dr. Jayme Cloud. 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 Proceed with Durvalumab.

## 2022-12-12 ENCOUNTER — Encounter: Payer: Self-pay | Admitting: Internal Medicine

## 2022-12-12 ENCOUNTER — Inpatient Hospital Stay (HOSPITAL_BASED_OUTPATIENT_CLINIC_OR_DEPARTMENT_OTHER): Payer: Medicare Other | Admitting: Internal Medicine

## 2022-12-12 VITALS — BP 142/80 | HR 78 | Temp 97.6°F | Resp 20 | Wt 226.6 lb

## 2022-12-12 DIAGNOSIS — Z5112 Encounter for antineoplastic immunotherapy: Secondary | ICD-10-CM | POA: Diagnosis not present

## 2022-12-12 DIAGNOSIS — C7931 Secondary malignant neoplasm of brain: Secondary | ICD-10-CM | POA: Diagnosis not present

## 2022-12-12 LAB — T4: T4, Total: 10.2 ug/dL (ref 4.5–12.0)

## 2022-12-12 NOTE — Progress Notes (Signed)
[facility] [address]  [place], [place] [address] [phone]   Interval Evaluation  Date of Service: 12/12/22 Patient Name: Joshua [name] Patient MRN: [id number] Patient DOB: 26-Aug-1955 Provider: Henreitta Leber, MD  Identifying Statement:  Joshua [name] is a 67 y.o. male with Malignant neoplasm metastatic to brain [facility])    Primary Cancer:  Oncologic History: Oncology History  Squamous cell carcinoma of right lung (HCC)  09/18/2020 Imaging   PET scan showed right upper lobe nodule maximal SUV 11.5.  Hypermetabolic cluster right paratracheal adenopathy with maximum SUV up to 12.  No findings of metastatic disease to the neck/abdomen/pelvis or skeleton.   09/20/2020 Initial Diagnosis   Squamous cell carcinoma of right lung (HCC) NGS showed PD-L1 TPS 95%, LRP1B S92fs, PIK3CA E545K, RB1 c1422-2A>T, RIT1 T83_ A84del, STK11 P263fs, TPS53 R248W, TMB 19.91mut/mb, MS stable.    09/20/2020 Cancer Staging   Staging form: Lung, AJCC 8th Edition - Clinical stage from 09/20/2020: Stage IV (cT1b, cN2, cM1) - Signed by [name], MD on 12/13/2021   10/03/2020 Imaging   MRI brain with and without contrast showed no evidence of intracranial metastatic disease. Well-defined round lesion within the left parotid tail measuring approximately 2.3 x 1.8 x 1.8 cm   10/09/2020 - 11/27/2020 Radiation Therapy   concurrent chemoradiation   10/10/2020 - 10/31/2020 Chemotherapy   10/10/2020 concurrent chemoradiation.  carboplatin AUC of 2 and Taxol 45 mg/m2 x 4.  Additional chemotherapy was held due to thrombocytopenia, neutropenia       12/31/2020 - 03/21/2022 Chemotherapy   Maintenance  Durvalumab q14d      12/31/2020 -  Chemotherapy   Patient is on Treatment Plan : LUNG Durvalumab (10) q14d     03/28/2021 Imaging   CT without contrast showed new linear right perihilar consolidation with associated traction bronchiectasis.  Likely postradiation changes.   Additional new peribronchial vascular/nodular groundglass opacities with areas of consolidation or seen primarily in the right upper and lower lobes, more distant from expected radiation fields. 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  Per my discussion with pulmonology Dr. 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     05/09/2021 Imaging   Repeat CT chest without contrast showed evolving radiation changes in the right perihilar region likely accounting for interval increase to thickening of the posterior wall of the right upper lobe bronchus.  The more peripheral right lung groundglass opacities seen on most recent study have generally improved with exception of one peripherally right upper lobe which appears new.  These are likely inflammatory.  No findings highly suspicious for local recurrence.   05/30/2021 - 01/10/2022 Chemotherapy   Durvalumab q14d     10/07/2021 Imaging   MRI brain with and without contrast showed 2 contrast-enhancing lesions, most consistent with metastatic disease.  Largest lesion is in the left frontal lobe and measures up to 1.3 cm with a large amount of surrounding edema and 9 mm rightward midline shift.  The other enhancing lesion was 4 mm in the right occipital lobe with a small amount of surrounding edema.  No acute or chronic hemorrhage.   10/07/2021 Progression   Headache after colonoscopy, progressive difficulty using her right arm.  Progressively worsening of speech. Stage IV lung cancer with brain metastasis.  started on Dexamethasone.  He was seen by neurosurgery and radiation oncology.  Recommendation is  to proceed with SRS.  Patient was discharged with dexamethasone   10/16/2021 Imaging   PET scan showed postradiation changes in the right supra hilar lung without evidence of local recurrence.  No evidence of lung cancer recurrence  or metastatic disease.  Single focus of intense metabolic activity associated with posterior left 11th rib likely secondary to recent fall/posttraumatic metabolic changes.  Frontal  uptake in the left frontal lobe corresponding to the known metastatic lesion in the brain   10/29/2021 -  Radiation Therapy   Brain Radiation [facility]   12/10/2021 Imaging   Brain MRI: Continued interval decrease in size of a left frontal lobe metastasis, now measuring 10 mm. Moderate surrounding edema has also decreased.  3-4 mm metastasis within the right occipital lobe, not significantly changed in size. There is now a small focus of central non enhancement within this lesion. Increased edema. 3 mm enhancing metastasis within the high left parietal lobe. In retrospect, this was likely present as a subtle punctate focus of enhancement on the prior brain MRI of 10/22/2021.   01/18/2022 Imaging   CT chest abdomen pelvis w contrast 1. Stable post treatment changes in the right upper lobe medially with dense radiation fibrosis but no findings to suggest local recurrent tumor. 2. Significant progression of ill-defined ground-glass opacity, interstitial thickening and airspace nodularity in the right upper lobe and upper aspect of the right lower lobe. Findings could reflect an inflammatory or atypical infectious process, drug induced pneumonitis or interstitial spread of tumor.3. No mediastinal or hilar mass or adenopathy. 4. No findings for abdominal/pelvic metastatic disease or osseous metastatic disease.   02/19/2022 Imaging   MRI brain w wo contrast showed Three metastatic deposits in the brain appear smaller. Decreased edema in the left parietal lobe. No new lesions    04/04/2022 Imaging   CT chest w contrast 1. Stable radiation changes in the right perihilar region without evidence of local recurrence. 2. Interval improvement in the patchy ground-glass opacities previously demonstrated in both lungs, most consistent with a  resolving infectious or inflammatory process (including immunotherapy-related pneumonitis). 3. No evidence of metastatic disease.   05/14/2022 Miscellaneous   He had a mechanical fall and fractured his left elbow. xrays showed a left elbow fracture  05/26/22 seen by orthopedic surgeon, cast applied.    05/27/2022 Imaging   MRI brain w wo contrast 1. All three known brain metastases have enlarged since August, with increasing regional edema. And there is a small new left anterior cerebellar 4-5 mm metastasis (was questionably punctate on the May MRI). This constellation could reflect a combination of True Progression (Left Cerebellar lesion) and pseudoprogression/radionecrosis (3 existing mets).    2. No significant intracranial mass effect. 3. Underlying moderate cerebral white matter signal changes.   05/30/2022 Imaging   CT chest abdomen pelvis w contrast  1. 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 There is a stable small right pleural effusion, but no definitive findings to suggest residual or recurrent disease on today's examination. 2. No signs of metastatic disease in the abdomen or pelvis.  3. Aortic atherosclerosis, in addition to two-vessel coronary artery disease. Please note that although the presence of coronary artery calcium documents the presence of coronary artery disease, the severity of this disease and any potential stenosis cannot be assessed on this non-gated CT examination. Assessment for potential risk factor modification, dietary therapy or pharmacologic therapy may be warranted, if clinically indicated.  4. Hepatic steatosis. 5. Colonic diverticulosis without evidence of acute diverticulitis at this time. 6. Incompletely imaged but apparently occluded vascular graft in the superior aspect of the right anterior thigh. Referral to vascular specialist  for further clinical evaluation should be considered if clinically appropriate. 7. Additional incidental findings, as above.   07/22/2022 Imaging   MRI brain  1. No new or enlarging lesions. No intracranial disease progression. 2. Decreased size of treated lesions of the left frontoparietal junction and left postcentral gyrus. 3. Unchanged lesions in the left cerebellum and right occipital lobe.   10/15/2022 Imaging   MRI brain w wo contrast showed Interval increase in size of multiple intraparenchymal lesions with worsened surrounding vasogenic edema. 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 No new lesions   11/05/2022 Imaging   MRI brain w wo contrast  1. Likely slight increase and right occipital and left cerebellar lesions. Otherwise, unchanged metastases, as above. No new lesions identified. 2. Partially imaged large probable lymph node in the left upper neck, apparent on prior PET CT.   11/13/2022 -  Radiation Therapy   Each of the sites were treated with SRS/SBRT/SRT-IMRT technique to 20 Gy in 1 fraction to each of the following sites: PTV_2_LCerebel_61mm PTV_3_RCerebel_82mm PTV_4_ROccip_55mm PTV_5_LParietal_91mm   11/27/2022 Imaging   CT chest abdomen pelvis w contrast showed  Improving bilateral scattered interstitial and ground-glass opacities. Small amount of residual areas in the extreme left lower lobe inferiorly. This specific area is slightly increased from previous but overall the amount of opacities is significantly improved. Recommend continued follow-up. No developing new mass lesion, fluid collection or lymph node enlargement. Stable posttreatment changes right perihilar in the lung. Fatty liver infiltration. Colonic diverticulosis. Improved tiny right pleural effusion    CNS Oncologic History 10/29/21: SRS to left frontal metastasis ([place]) 11/13/22: salvage SRS x5 with Dr.  Mitzi Hansen  Interval History: [name] presents today for follow up after recently completing salvage radiosurgery in [place].  No new or progressive changes, though he does describe occasional headaches.  Continues to ambulate with a cane as prior.  Back on immunotherapy with Dr. Cathie Hoops, no longer dosing decadron.  H+P (06/20/22) Patient presents today for evaluation for neurologic complaints.  He describes gradual onset of impaired speech output and right arm greater than right leg weakness.  This led to a fall and an injury to his left arm.  He was started on decadron two weeks ago, this has led to considerable improvement in symptoms.  Today he feels at his prior baseline.  No issues tolerating the steroids.  He is due for durvalumab infusion today with Dr. Cathie Hoops.   Medications: Current Outpatient Medications on File Prior to Visit  Medication Sig Dispense Refill   aspirin EC 81 MG tablet Take 81 mg by mouth daily. Swallow whole.     clopidogrel (PLAVIX) 75 MG tablet TAKE 1 TABLET BY MOUTH DAILY 100 tablet 2   clopidogrel (PLAVIX) 75 MG tablet Take by mouth.     fluticasone (FLONASE) 50 MCG/ACT nasal spray Place 1 spray into both nostrils 2 (two) times daily. 15.8 mL 2   levothyroxine (SYNTHROID) 100 MCG tablet TAKE 1 TABLET BY MOUTH ONCE DAILY BEFORE BREAKFAST 90 tablet 0   simvastatin (ZOCOR) 40 MG tablet Take 40 mg by mouth at bedtime.     Current Facility-Administered Medications on File Prior to Visit  Medication Dose Route Frequency Provider Last Rate Last Admin   albuterol (PROVENTIL) (  2.5 MG/3ML) 0.083% nebulizer solution 2.5 mg  2.5 mg Nebulization Once Salena Saner, MD        Allergies: No Known Allergies Past Medical History:  Past Medical History:  Diagnosis Date   Allergy    Cancer (HCC)    lung   COPD (chronic obstructive pulmonary disease) (HCC)    Dyspnea    former smoker. only doe   History of kidney stones    Hypertension    Peripheral vascular disease  (HCC)    Pre-diabetes    Sleep apnea    Past Surgical History:  Past Surgical History:  Procedure Laterality Date   ANTERIOR CERVICAL DECOMP/DISCECTOMY FUSION N/A 03/14/2020   Procedure: ANTERIOR CERVICAL DECOMPRESSION/DISCECTOMY FUSION 3 LEVELS C4-7;  Surgeon: Venetia Night, MD;  Location: [facility] ORS;  Service: Neurosurgery;  Laterality: N/A;   BACK SURGERY  2011   CENTRAL LINE INSERTION Right 09/10/2016   Procedure: CENTRAL LINE INSERTION;  Surgeon: Renford Dills, MD;  Location: [facility] ORS;  Service: Vascular;  Laterality: Right;   CERVICAL FUSION     C4-c7 on Mar 14, 2020   COLONOSCOPY  2013 ?   COLONOSCOPY N/A 09/09/2021   Procedure: COLONOSCOPY;  Surgeon: Jaynie Collins, DO;  Location: [facility] ENDOSCOPY;  Service: Gastroenterology;  Laterality: N/A;   CORONARY ANGIOPLASTY     ESOPHAGOGASTRODUODENOSCOPY N/A 09/09/2021   Procedure: ESOPHAGOGASTRODUODENOSCOPY (EGD);  Surgeon: Jaynie Collins, DO;  Location: [facility] ENDOSCOPY;  Service: Gastroenterology;  Laterality: N/A;   FEMORAL-POPLITEAL BYPASS GRAFT Right 09/10/2016   Procedure: BYPASS GRAFT FEMORAL-POPLITEAL ARTERY ( BELOW KNEE );  Surgeon: Renford Dills, MD;  Location: [facility] ORS;  Service: Vascular;  Laterality: Right;   FEMORAL-TIBIAL BYPASS GRAFT Right 03/23/2020   Procedure: BYPASS GRAFT RIGHT FEMORAL- DISTAL TIBIAL ARTERY WITH DISTAL FLOW GRAFT;  Surgeon: Renford Dills, MD;  Location: [facility] ORS;  Service: Vascular;  Laterality: Right;   LEFT HEART CATH AND CORONARY ANGIOGRAPHY Left 05/16/2021   Procedure: LEFT HEART CATH AND CORONARY ANGIOGRAPHY;  Surgeon: Armando Reichert, MD;  Location: [facility] INVASIVE CV LAB;  Service: Cardiovascular;  Laterality: Left;   LOWER EXTREMITY ANGIOGRAPHY Right 11/18/2016   Procedure: Lower Extremity Angiography;  Surgeon: Renford Dills, MD;  Location: [facility] INVASIVE CV LAB;  Service: Cardiovascular;  Laterality: Right;   LOWER EXTREMITY ANGIOGRAPHY Right 12/09/2016   Procedure:  Lower Extremity Angiography;  Surgeon: Renford Dills, MD;  Location: [facility] INVASIVE CV LAB;  Service: Cardiovascular;  Laterality: Right;   LOWER EXTREMITY ANGIOGRAPHY Right 11/15/2019   Procedure: LOWER EXTREMITY ANGIOGRAPHY;  Surgeon: Renford Dills, MD;  Location: [facility] INVASIVE CV LAB;  Service: Cardiovascular;  Laterality: Right;   LOWER EXTREMITY ANGIOGRAPHY Right 12/01/2019   Procedure: Lower Extremity Angiography;  Surgeon: Annice Needy, MD;  Location: [facility] INVASIVE CV LAB;  Service: Cardiovascular;  Laterality: Right;   LOWER EXTREMITY ANGIOGRAPHY Right 12/02/2019   Procedure: LOWER EXTREMITY ANGIOGRAPHY;  Surgeon: Renford Dills, MD;  Location: [facility] INVASIVE CV LAB;  Service: Cardiovascular;  Laterality: Right;   LOWER EXTREMITY ANGIOGRAPHY Right 03/23/2020   Procedure: Lower Extremity Angiography;  Surgeon: Renford Dills, MD;  Location: [facility] INVASIVE CV LAB;  Service: Cardiovascular;  Laterality: Right;   LOWER EXTREMITY ANGIOGRAPHY Right 09/25/2021   Procedure: Lower Extremity Angiography;  Surgeon: Renford Dills, MD;  Location: [facility] INVASIVE CV LAB;  Service: Cardiovascular;  Laterality: Right;   LOWER EXTREMITY ANGIOGRAPHY Right 09/26/2021   Procedure: Lower Extremity Angiography;  Surgeon: Annice Needy, MD;  Location:  [facility] INVASIVE CV LAB;  Service: Cardiovascular;  Laterality: Right;   LOWER EXTREMITY ANGIOGRAPHY Right 04/08/2022   Procedure: Lower Extremity Angiography;  Surgeon: Renford Dills, MD;  Location: [facility] INVASIVE CV LAB;  Service: Cardiovascular;  Laterality: Right;   LOWER EXTREMITY INTERVENTION  11/18/2016   Procedure: Lower Extremity Intervention;  Surgeon: Renford Dills, MD;  Location: [facility] INVASIVE CV LAB;  Service: Cardiovascular;;   PERIPHERAL VASCULAR CATHETERIZATION Right 01/02/2015   Procedure: Lower Extremity Angiography;  Surgeon: Renford Dills, MD;  Location: [facility] INVASIVE CV LAB;  Service: Cardiovascular;  Laterality: Right;    PERIPHERAL VASCULAR CATHETERIZATION Right 06/18/2015   Procedure: Lower Extremity Angiography;  Surgeon: Annice Needy, MD;  Location: [facility] INVASIVE CV LAB;  Service: Cardiovascular;  Laterality: Right;   PERIPHERAL VASCULAR CATHETERIZATION  06/18/2015   Procedure: Lower Extremity Intervention;  Surgeon: Annice Needy, MD;  Location: [facility] INVASIVE CV LAB;  Service: Cardiovascular;;   PERIPHERAL VASCULAR CATHETERIZATION N/A 10/09/2015   Procedure: Abdominal Aortogram w/Lower Extremity;  Surgeon: Renford Dills, MD;  Location: [facility] INVAS[facility] CV LAB;  Service: Cardiovascular;  Laterality: N/A;   PERIPHERAL VASCULAR CATHETERIZATION  10/09/2015   Procedure: Lower Extremity Intervention;  Surgeon: Renford Dills, MD;  Location: [facility] INV[facility] LAB;  Service: Cardiovascular;;   PERIPHERAL VASCULAR CATHETERIZATION Right 10/10/2015   Procedure: Lower Extremity Angiography;  Surgeon: Annice Needy, MD;  Location: [facility];  Service: Cardiovascular;  Laterality: Right;   PERIPHERAL VASCULAR CATHETERIZATION Right 10/10/2015   Procedure: Lower Extremity Intervention;  Surgeon: Annice Needy, MD;  Location: [facility];  Service: Cardiovascular;  Laterality: Right;   PERIPHERAL VASCULAR CATHETERIZATION Right 12/03/2015   Procedure: Lower Extremity Angiography;  Surgeon: Annice Needy, MD;  Location: [facility];  Service: Cardiovascular;  Laterality: Right;   PERIPHERAL VASCULAR CATHETERIZATION Right 12/04/2015   Procedure: Lower Extremity Angiography;  Surgeon: Annice Needy, MD;  Location: [facility];  Service: Cardiovascular;  Laterality: Right;   PERIPHERAL VASCULAR CATHETERIZATION  12/04/2015   Procedure: Lower Extremity Intervention;  Surgeon: Annice Needy, MD;  Location: [facility];  Service: Cardiovascular;;   PERIPHERAL VASCULAR CATHETERIZATION Right 06/30/2016   Procedure: Lower Extremity Angiography;  Surgeon: Annice Needy, MD;  Location: [facility] IN[facility] LAB;   Service: Cardiovascular;  Laterality: Right;   PERIPHERAL VASCULAR CATHETERIZATION Right 06/25/2016   Procedure: Lower Extremity Angiography;  Surgeon: Renford Dills, MD;  Location: [facility];  Service: Cardiovascular;  Laterality: Right;   PERIPHERAL VASCULAR CATHETERIZATION Right 07/01/2016   Procedure: Lower Extremity Angiography;  Surgeon: Renford Dills, MD;  Location: [facility];  Service: Cardiovascular;  Laterality: Right;   PERIPHERAL VASCULAR CATHETERIZATION  07/01/2016   Procedure: Lower Extremity Intervention;  Surgeon: Renford Dills, MD;  Location: [facility];  Service: Cardiovascular;;   PERIPHERAL VASCULAR CATHETERIZATION Right 08/01/2016   Procedure: Lower Extremity Angiography;  Surgeon: Renford Dills, MD;  Location: [facility] INV[facility] LAB;  Service: Cardiovascular;  Laterality: Right;   PORTA CATH INSERTION N/A 10/05/2020   Procedure: PORTA CATH INSERTION;  Surgeon: Renford Dills, MD;  Location: [facility] INV[facility]IVE CV LAB;  Service: Cardiovascular;  Laterality: N/A;   stent placement in right leg Right    VIDEO BRONCHOSCOPY N/A 09/14/2020   Procedure: VIDEO BRONCHOSCOPY WITH FLUORO;  Surgeon: Salena Saner, MD;  Location: [facility] ORS;  Service: Cardiopulmonary;  Laterality: N/A;   VIDEO BRONCHOSCOPY WITH ENDOBRONCHIAL  ULTRASOUND N/A 09/14/2020   Procedure: VIDEO BRONCHOSCOPY WITH ENDOBRONCHIAL ULTRASOUND;  Surgeon: Salena Saner, MD;  Location: [facility] ORS;  Service: Cardiopulmonary;  Laterality: N/A;   Social History:  Social History   Socioeconomic History   Marital status: Single    Spouse name: Not on file   Number of children: Not on file   Years of education: Not on file   Highest education level: Not on file  Occupational History   Occupation: unemployed  Tobacco Use   Smoking status: Former    Packs/day: 2.00    Years: 38.00    Additional pack years: 0.00    Total pack years: 76.00    Types: Cigarettes    Quit  date: 12/20/2014    Years since quitting: 7.9   Smokeless tobacco: Never   Tobacco comments:    quit smoking in 2017 most smoked 2   Vaping Use   Vaping Use: Former  Substance and Sexual Activity   Alcohol use: Not Currently   Drug use: No   Sexual activity: Yes  Other Topics Concern   Not on file  Social History Narrative   Not on file   Social Determinants of Health   Financial Resource Strain: Not on file  Food Insecurity: Not on file  Transportation Needs: Not on file  Physical Activity: Not on file  Stress: Not on file  Social Connections: Not on file  Intimate Partner Violence: Not on file   Family History:  Family History  Problem Relation Age of Onset   Diabetes Mother    Hypertension Mother    Heart murmur Mother    Leukemia Mother    Throat cancer Maternal Grandmother    Colon cancer Neg Hx    Breast cancer Neg Hx     Review of Systems: Constitutional: Doesn't report fevers, chills or abnormal weight loss Eyes: Doesn't report blurriness of vision Ears, nose, mouth, throat, and face: Doesn't report sore throat Respiratory: Doesn't report cough, dyspnea or wheezes Cardiovascular: Doesn't report palpitation, chest discomfort  Gastrointestinal:  Doesn't report nausea, constipation, diarrhea GU: Doesn't report incontinence Skin: Doesn't report skin rashes Neurological: Per HPI Musculoskeletal: Doesn't report joint pain Behavioral/Psych: Doesn't report anxiety  Physical Exam: Vitals:   12/12/22 1023  BP: (!) 142/80  Pulse: 78  Resp: 20  Temp: 97.6 F (36.4 C)  SpO2: 100%   KPS: 80. General: Alert, cooperative, pleasant, in no acute distress Head: Normal EENT: No conjunctival injection or scleral icterus.  Lungs: Resp effort normal Cardiac: Regular rate Abdomen: Non-distended abdomen Skin: No rashes cyanosis or petechiae. Extremities: Left arm in sling  Neurologic Exam: Mental Status: Awake, alert, attentive to examiner. Oriented to self and  environment. Language is fluent with intact comprehension.  Cranial Nerves: Visual acuity is grossly normal. Visual fields are full. Extra-ocular movements intact. No ptosis. Face is symmetric Motor: Tone and bulk are normal. Power is full in both arms and legs. Reflexes are symmetric, no pathologic reflexes present.  Sensory: Intact to light touch Gait: Normal.   Labs: I have reviewed the data as listed    Component Value Date/Time   NA 142 12/11/2022 0850   NA 141 10/14/2019 1101   K 3.5 12/11/2022 0850   CL 107 12/11/2022 0850   CO2 24 12/11/2022 0850   GLUCOSE 155 (H) 12/11/2022 0850   BUN 15 12/11/2022 0850   BUN 15 10/14/2019 1101   BUN 16 07/03/2014 1205   CREATININE 1.04 12/11/2022 0850   CREATININE 1.11  11/06/2022 1041   CREATININE 1.34 (H) 07/03/2014 1205   CALCIUM 8.8 (L) 12/11/2022 0850   PROT 6.7 12/11/2022 0850   PROT 7.0 04/16/2020 1525   ALBUMIN 3.6 12/11/2022 0850   ALBUMIN 4.3 04/16/2020 1525   AST 25 12/11/2022 0850   ALT 21 12/11/2022 0850   ALKPHOS 76 12/11/2022 0850   BILITOT 0.4 12/11/2022 0850   BILITOT 0.4 04/16/2020 1525   GFRNONAA >60 12/11/2022 0850   GFRNONAA >60 11/06/2022 1041   GFRNONAA 59 (L) 07/03/2014 1205   GFRAA >60 03/30/2020 0536   GFRAA >60 07/03/2014 1205   Lab Results  Component Value Date   WBC 5.1 12/11/2022   NEUTROABS 3.7 12/11/2022   HGB 13.1 12/11/2022   HCT 40.1 12/11/2022   MCV 86.1 12/11/2022   PLT 204 12/11/2022     Assessment/Plan Malignant neoplasm metastatic to brain (HCC)  Lando A Curro is clinically stable today, now having completed salvage SRS to 5 additional metastatic foci with Dr. Mitzi Hansen.  Recommended remaining off decadron if tolerated.    We appreciate the opportunity to participate in the care of [employer].   We ask that [name] return to clinic in 2 months following post-SRS brain MRI, or sooner as needed.  All questions were answered. The patient knows to call the  clinic with any problems, questions or concerns. No barriers to learning were detected.  The total time spent in the encounter was 30 minutes and more than 50% was on counseling and review of test results   Henreitta Leber, MD Medical Director of Neuro-Oncology [facility] 12/12/22 10:29 AM

## 2022-12-16 ENCOUNTER — Ambulatory Visit
Admission: RE | Admit: 2022-12-16 | Discharge: 2022-12-16 | Disposition: A | Discharge status: Home / Self Care | Payer: Medicare Other | Source: Ambulatory Visit | Attending: Radiation Oncology | Admitting: Radiation Oncology

## 2022-12-16 NOTE — Progress Notes (Signed)
  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 Voicemail left.  The patient has scheduled follow up with his medical oncologist Dr. Timoteo Expose for ongoing care, and was encouraged to call if he  develops concerns or questions regarding radiation.   Ruel Favors, LPN

## 2022-12-25 ENCOUNTER — Inpatient Hospital Stay: Payer: Medicare Other

## 2022-12-25 ENCOUNTER — Inpatient Hospital Stay: Payer: Medicare Other | Attending: Oncology

## 2022-12-25 ENCOUNTER — Encounter: Payer: Self-pay | Admitting: Oncology

## 2022-12-25 ENCOUNTER — Other Ambulatory Visit (INDEPENDENT_AMBULATORY_CARE_PROVIDER_SITE_OTHER): Payer: Self-pay | Admitting: Nurse Practitioner

## 2022-12-25 ENCOUNTER — Inpatient Hospital Stay (HOSPITAL_BASED_OUTPATIENT_CLINIC_OR_DEPARTMENT_OTHER): Payer: Medicare Other | Admitting: Oncology

## 2022-12-25 VITALS — BP 144/84 | HR 98 | Temp 97.6°F | Resp 18 | Wt 222.6 lb

## 2022-12-25 DIAGNOSIS — Z79620 Long term (current) use of immunosuppressive biologic: Secondary | ICD-10-CM | POA: Diagnosis not present

## 2022-12-25 DIAGNOSIS — C3411 Malignant neoplasm of upper lobe, right bronchus or lung: Secondary | ICD-10-CM | POA: Insufficient documentation

## 2022-12-25 DIAGNOSIS — C7931 Secondary malignant neoplasm of brain: Secondary | ICD-10-CM | POA: Diagnosis present

## 2022-12-25 DIAGNOSIS — E032 Hypothyroidism due to medicaments and other exogenous substances: Secondary | ICD-10-CM | POA: Diagnosis not present

## 2022-12-25 DIAGNOSIS — K760 Fatty (change of) liver, not elsewhere classified: Secondary | ICD-10-CM | POA: Diagnosis not present

## 2022-12-25 DIAGNOSIS — Z5112 Encounter for antineoplastic immunotherapy: Secondary | ICD-10-CM | POA: Diagnosis present

## 2022-12-25 DIAGNOSIS — C3491 Malignant neoplasm of unspecified part of right bronchus or lung: Secondary | ICD-10-CM

## 2022-12-25 DIAGNOSIS — I739 Peripheral vascular disease, unspecified: Secondary | ICD-10-CM

## 2022-12-25 DIAGNOSIS — K5730 Diverticulosis of large intestine without perforation or abscess without bleeding: Secondary | ICD-10-CM | POA: Insufficient documentation

## 2022-12-25 DIAGNOSIS — J90 Pleural effusion, not elsewhere classified: Secondary | ICD-10-CM | POA: Insufficient documentation

## 2022-12-25 LAB — COMPREHENSIVE METABOLIC PANEL
ALT: 18 U/L (ref 0–44)
AST: 23 U/L (ref 15–41)
Albumin: 3.8 g/dL (ref 3.5–5.0)
Alkaline Phosphatase: 71 U/L (ref 38–126)
Anion gap: 10 (ref 5–15)
BUN: 12 mg/dL (ref 8–23)
CO2: 24 mmol/L (ref 22–32)
Calcium: 9 mg/dL (ref 8.9–10.3)
Chloride: 106 mmol/L (ref 98–111)
Creatinine, Ser: 1.04 mg/dL (ref 0.61–1.24)
GFR, Estimated: >60 mL/min (ref >60)
Glucose, Bld: 136 mg/dL — ABNORMAL HIGH (ref 70–99)
Potassium: 3.7 mmol/L (ref 3.5–5.1)
Sodium: 140 mmol/L (ref 135–145)
Total Bilirubin: 0.3 mg/dL (ref 0.3–1.2)
Total Protein: 6.9 g/dL (ref 6.5–8.1)

## 2022-12-25 LAB — CBC WITH DIFFERENTIAL/PLATELET
Abs Immature Granulocytes: 0.01 10*3/uL (ref 0.00–0.07)
Basophils Absolute: 0.1 10*3/uL (ref 0.0–0.1)
Basophils Relative: 1 %
Eosinophils Absolute: 0.1 10*3/uL (ref 0.0–0.5)
Eosinophils Relative: 2 %
HCT: 41.4 % (ref 39.0–52.0)
Hemoglobin: 13.4 g/dL (ref 13.0–17.0)
Immature Granulocytes: 0 %
Lymphocytes Relative: 16 %
Lymphs Abs: 0.8 10*3/uL (ref 0.7–4.0)
MCH: 27.2 pg (ref 26.0–34.0)
MCHC: 32.4 g/dL (ref 30.0–36.0)
MCV: 84 fL (ref 80.0–100.0)
Monocytes Absolute: 0.6 10*3/uL (ref 0.1–1.0)
Monocytes Relative: 12 %
Neutro Abs: 3.5 10*3/uL (ref 1.7–7.7)
Neutrophils Relative %: 69 %
Platelets: 252 10*3/uL (ref 150–400)
RBC: 4.93 MIL/uL (ref 4.22–5.81)
RDW: 13.7 % (ref 11.5–15.5)
WBC: 5.1 10*3/uL (ref 4.0–10.5)
nRBC: 0 % (ref 0.0–0.2)

## 2022-12-25 LAB — TSH: TSH: 23.495 u[IU]/mL — ABNORMAL HIGH (ref 0.350–4.500)

## 2022-12-25 MED ORDER — SODIUM CHLORIDE 0.9 % IV SOLN
10.0000 mg/kg | Freq: Once | INTRAVENOUS | Status: AC
  Administered 2022-12-25: 1000 mg via INTRAVENOUS
  Filled 2022-12-25: qty 20

## 2022-12-25 MED ORDER — SODIUM CHLORIDE 0.9% FLUSH
10.0000 mL | INTRAVENOUS | Status: DC | PRN
  Filled 2022-12-25: qty 10

## 2022-12-25 MED ORDER — SODIUM CHLORIDE 0.9 % IV SOLN
Freq: Once | INTRAVENOUS | Status: AC
  Filled 2022-12-25: qty 250

## 2022-12-25 MED ORDER — HEPARIN SOD (PORK) LOCK FLUSH 100 UNIT/ML IV SOLN
500.0000 [IU] | Freq: Once | INTRAVENOUS | Status: DC | PRN
  Filled 2022-12-25: qty 5

## 2022-12-25 NOTE — Assessment & Plan Note (Signed)
Durvalumab treatment as planned.  

## 2022-12-25 NOTE — Progress Notes (Signed)
Hematology/Oncology Progress note [phone] [phone]    CHIEF COMPLAINTS/REASON FOR VISIT:  Follow up for lung cancer   ASSESSMENT & PLAN:   Cancer Staging  Squamous cell carcinoma of right lung (HCC) Staging form: Lung, AJCC 8th Edition - Clinical stage from 09/20/2020: Stage IV (cT1b, cN2, cM1) - Signed by [name], MD on 12/13/2021   Squamous cell carcinoma of right lung (HCC) Durvalumab was previously held due to pneumonitis.  May 2024 CT scan showed Improving bilateral scattered interstitial and ground-glass opacities. Discussed with pulmonology Dr. Jayme Cloud. 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 Proceed with Durvalumab.    Hypothyroidism due to medication Continue Synthroid 100 mcg daily   Encounter for antineoplastic immunotherapy Durvalumab treatment as planned.   Malignant neoplasm metastatic to brain [facility]) Status post radiation,  02/19/22 MRI showed decreased brain lesion size and edema.  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 small new left anterior cerebellar 4-5 mm metastasis  March 2024  MRI brain Interval increase in size of multiple intraparenchymal lesions with worsened surrounding vasogenic edema- resumed on Dexamethasone 2mg  daily.  Neurology Dr. Barbaraann Cao recommends radiation treatments due to increase size of these lesions.  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 Mitzi Hansen.    Hypocalcemia Recommend patient to take Calcium 1200mg  daily.   Orders Placed This Encounter  Procedures   T4    Standing Status:   Future    Standing Expiration Date:   01/08/2024   TSH    Standing Status:   Future    Standing Expiration Date:   01/08/2024   CBC with Differential    Standing Status:   Future    Standing Expiration Date:   01/08/2024   Comprehensive metabolic panel    Standing Status:   Future     Standing Expiration Date:   01/08/2024   T4    Standing Status:   Future    Standing Expiration Date:   01/22/2024   TSH    Standing Status:   Future    Standing Expiration Date:   01/22/2024   CBC with Differential    Standing Status:   Future    Standing Expiration Date:   01/22/2024   Comprehensive metabolic panel    Standing Status:   Future    Standing Expiration Date:   01/22/2024    Follow up 2 weeks lab MD Durvalumab All questions were answered. The patient knows to call the clinic with any problems, questions or concerns.  [name], MD, PhD [facility] Hematology Oncology 12/25/2022       HISTORY OF PRESENTING ILLNESS:  Joshua [name] is a 67 y.o. male who has above history reviewed by me today presents for follow up visit for Stage IV lung squamous cell carcinoma. Oncology History  Squamous cell carcinoma of right lung (HCC)  09/18/2020 Imaging   PET scan showed right upper lobe nodule maximal SUV 11.5.  Hypermetabolic cluster right paratracheal adenopathy with maximum SUV up to 12.  No findings of metastatic disease to the neck/abdomen/pelvis or skeleton.   09/20/2020 Initial Diagnosis   Squamous cell carcinoma of right lung (HCC) NGS showed PD-L1 TPS 95%, LRP1B S940fs, PIK3CA E545K, RB1 c1422-2A>T, RIT1 T83_ A84del, STK11 P242fs, TPS53 R248W, TMB 19.23mut/mb, MS stable.    09/20/2020 Cancer Staging   Staging form: Lung, AJCC 8th Edition - Clinical stage from 09/20/2020: Stage IV (cT1b,  cN2, cM1) - Signed by [name], MD on 12/13/2021   10/03/2020 Imaging   MRI brain with and without contrast showed no evidence of intracranial metastatic disease. Well-defined round lesion within the left parotid tail measuring approximately 2.3 x 1.8 x 1.8 cm   10/09/2020 - 11/27/2020 Radiation Therapy   concurrent chemoradiation   10/10/2020 - 10/31/2020 Chemotherapy   10/10/2020 concurrent chemoradiation.  carboplatin AUC of 2 and Taxol 45 mg/m2 x 4.  Additional chemotherapy was held due  to thrombocytopenia, neutropenia       12/31/2020 - 03/21/2022 Chemotherapy   Maintenance  Durvalumab q14d      12/31/2020 -  Chemotherapy   Patient is on Treatment Plan : LUNG Durvalumab (10) q14d     03/28/2021 Imaging   CT without contrast showed new linear right perihilar consolidation with associated traction bronchiectasis.  Likely postradiation changes.  Additional new peribronchial vascular/nodular groundglass opacities with areas of consolidation or seen primarily in the right upper and lower lobes, more distant from expected radiation fields. 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  Per my discussion with pulmonology Dr. 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     05/09/2021 Imaging   Repeat CT chest without contrast showed evolving radiation changes in the right perihilar region likely accounting for interval increase to thickening of the posterior wall of the right upper lobe bronchus.  The more peripheral right lung groundglass opacities seen on most recent study have generally improved with exception of one peripherally right upper lobe which appears new.  These are likely inflammatory.  No findings highly suspicious for local recurrence.   05/30/2021 - 01/10/2022 Chemotherapy   Durvalumab q14d     10/07/2021 Imaging   MRI brain with and without contrast showed 2 contrast-enhancing lesions, most consistent with metastatic disease.  Largest lesion is in the left frontal lobe and measures up to 1.3 cm with a large amount of surrounding edema and 9 mm rightward midline shift.  The other enhancing lesion was 4 mm in the right occipital lobe with a small amount of surrounding edema.  No acute or chronic hemorrhage.   10/07/2021 Progression   Headache after colonoscopy, progressive difficulty using her right arm.  Progressively worsening of speech. Stage  IV lung cancer with brain metastasis.  started on Dexamethasone.  He was seen by neurosurgery and radiation oncology.  Recommendation is to proceed with SRS.  Patient was discharged with dexamethasone   10/16/2021 Imaging   PET scan showed postradiation changes in the right supra hilar lung without evidence of local recurrence.  No evidence of lung cancer recurrence or metastatic disease.  Single focus of intense metabolic activity associated with posterior left 11th rib likely secondary to recent fall/posttraumatic metabolic changes.  Frontal  uptake in the left frontal lobe corresponding to the known metastatic lesion in the brain   10/29/2021 -  Radiation Therapy   Brain Radiation [facility]   12/10/2021 Imaging   Brain MRI: Continued interval decrease in size of a left frontal lobe metastasis, now measuring 10 mm. Moderate surrounding edema has also decreased.  3-4 mm metastasis within the right occipital lobe, not significantly changed in size. There is now a small focus of central non enhancement within this lesion. Increased edema. 3 mm enhancing metastasis within the high left parietal lobe. In retrospect, this was likely present as a subtle punctate focus of enhancement on the  prior brain MRI of 10/22/2021.   01/18/2022 Imaging   CT chest abdomen pelvis w contrast 1. Stable post treatment changes in the right upper lobe medially with dense radiation fibrosis but no findings to suggest local recurrent tumor. 2. Significant progression of ill-defined ground-glass opacity, interstitial thickening and airspace nodularity in the right upper lobe and upper aspect of the right lower lobe. Findings could reflect an inflammatory or atypical infectious process, drug induced pneumonitis or interstitial spread of tumor.3. No mediastinal or hilar mass or adenopathy. 4. No findings for abdominal/pelvic metastatic disease or osseous metastatic disease.   02/19/2022 Imaging   MRI brain w wo contrast showed Three  metastatic deposits in the brain appear smaller. Decreased edema in the left parietal lobe. No new lesions    04/04/2022 Imaging   CT chest w contrast 1. Stable radiation changes in the right perihilar region without evidence of local recurrence. 2. Interval improvement in the patchy ground-glass opacities previously demonstrated in both lungs, most consistent with a resolving infectious or inflammatory process (including immunotherapy-related pneumonitis). 3. No evidence of metastatic disease.   05/14/2022 Miscellaneous   He had a mechanical fall and fractured his left elbow. xrays showed a left elbow fracture  05/26/22 seen by orthopedic surgeon, cast applied.    05/27/2022 Imaging   MRI brain w wo contrast 1. All three known brain metastases have enlarged since August, with increasing regional edema. And there is a small new left anterior cerebellar 4-5 mm metastasis (was questionably punctate on the May MRI). This constellation could reflect a combination of True Progression (Left Cerebellar lesion) and pseudoprogression/radionecrosis (3 existing mets).    2. No significant intracranial mass effect. 3. Underlying moderate cerebral white matter signal changes.   05/30/2022 Imaging   CT chest abdomen pelvis w contrast  1. 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 There is a stable small right pleural effusion, but no definitive findings to suggest residual or recurrent disease on today's examination. 2. No signs of metastatic disease in the abdomen or pelvis.  3. Aortic atherosclerosis, in addition to two-vessel coronary artery disease. Please note that although the presence of coronary artery calcium documents the presence of coronary artery disease, the severity of this disease and any potential stenosis cannot be assessed on this non-gated CT examination. Assessment  for potential risk factor modification, dietary therapy or pharmacologic therapy may be warranted, if clinically indicated. 4. Hepatic steatosis. 5. Colonic diverticulosis without evidence of acute diverticulitis at this time. 6. Incompletely imaged but apparently occluded vascular graft in the superior aspect of the right anterior thigh. Referral to vascular specialist for further clinical evaluation should be considered if clinically appropriate. 7. Additional incidental findings, as above.   07/22/2022 Imaging   MRI brain  1. No new or enlarging lesions. No intracranial disease progression. 2. Decreased size of treated lesions of the left frontoparietal junction and left postcentral gyrus. 3. Unchanged lesions in the left cerebellum and right occipital lobe.   10/15/2022 Imaging   MRI brain w wo contrast showed Interval increase in size of multiple intraparenchymal lesions with worsened surrounding vasogenic edema. 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 No new lesions   11/05/2022 Imaging   MRI brain w wo contrast  1. Likely slight increase and right occipital and left cerebellar lesions. Otherwise, unchanged metastases, as above. No new lesions identified. 2. Partially imaged large  probable lymph node in the left upper neck, apparent on prior PET CT.   11/13/2022 -  Radiation Therapy   Each of the sites were treated with SRS/SBRT/SRT-IMRT technique to 20 Gy in 1 fraction to each of the following sites: PTV_2_LCerebel_38mm PTV_3_RCerebel_29mm PTV_4_ROccip_61mm PTV_5_LParietal_47mm   11/27/2022 Imaging   CT chest abdomen pelvis w contrast showed  Improving bilateral scattered interstitial and ground-glass opacities. Small amount of residual areas in the extreme left lower lobe inferiorly. This specific area is slightly increased from previous but overall the amount of opacities is significantly improved. Recommend  continued follow-up. No developing new mass lesion, fluid collection or lymph node enlargement. Stable posttreatment changes right perihilar in the lung. Fatty liver infiltration. Colonic diverticulosis. Improved tiny right pleural effusion    Today he feels well.Intermittent headache, is better. denies any focal deficits. chronic SOB,not worse.  occasional cough. No new complaints.    Review of Systems  Constitutional:  Positive for fatigue. Negative for appetite change, chills, fever and unexpected weight change.  HENT:   Negative for hearing loss and voice change.   Eyes:  Negative for eye problems and icterus.  Respiratory:  Negative for chest tightness, cough and shortness of breath.   Cardiovascular:  Negative for chest pain and leg swelling.  Gastrointestinal:  Negative for abdominal distention and abdominal pain.  Endocrine: Negative for hot flashes.  Genitourinary:  Negative for difficulty urinating, dysuria and frequency.   Musculoskeletal:  Negative for arthralgias.  Skin:  Negative for itching and rash.  Neurological:  Positive for extremity weakness. Negative for headaches, light-headedness, numbness and speech difficulty.  Hematological:  Negative for adenopathy. Does not bruise/bleed easily.  Psychiatric/Behavioral:  Negative for confusion.       MEDICAL HISTORY:  Past Medical History:  Diagnosis Date   Allergy    Cancer (HCC)    lung   COPD (chronic obstructive pulmonary disease) (HCC)    Dyspnea    former smoker. only doe   History of kidney stones    Hypertension    Peripheral vascular disease (HCC)    Pre-diabetes    Sleep apnea     SURGICAL HISTORY: Past Surgical History:  Procedure Laterality Date   ANTERIOR CERVICAL DECOMP/DISCECTOMY FUSION N/A 03/14/2020   Procedure: ANTERIOR CERVICAL DECOMPRESSION/DISCECTOMY FUSION 3 LEVELS C4-7;  Surgeon: Venetia Night, MD;  Location: [facility] ORS;  Service: Neurosurgery;  Laterality: N/A;   BACK SURGERY  2011    CENTRAL LINE INSERTION Right 09/10/2016   Procedure: CENTRAL LINE INSERTION;  Surgeon: Renford Dills, MD;  Location: [facility] ORS;  Service: Vascular;  Laterality: Right;   CERVICAL FUSION     C4-c7 on Mar 14, 2020   COLONOSCOPY  2013 ?   COLONOSCOPY N/A 09/09/2021   Procedure: COLONOSCOPY;  Surgeon: Jaynie Collins, DO;  Location: [facility] ENDOSCOPY;  Service: Gastroenterology;  Laterality: N/A;   CORONARY ANGIOPLASTY     ESOPHAGOGASTRODUODENOSCOPY N/A 09/09/2021   Procedure: ESOPHAGOGASTRODUODENOSCOPY (EGD);  Surgeon: Jaynie Collins, DO;  Location: [facility] ENDOSCOPY;  Service: Gastroenterology;  Laterality: N/A;   FEMORAL-POPLITEAL BYPASS GRAFT Right 09/10/2016   Procedure: BYPASS GRAFT FEMORAL-POPLITEAL ARTERY ( BELOW KNEE );  Surgeon: Renford Dills, MD;  Location: [facility] ORS;  Service: Vascular;  Laterality: Right;   FEMORAL-TIBIAL BYPASS GRAFT Right 03/23/2020   Procedure: BYPASS GRAFT RIGHT FEMORAL- DISTAL TIBIAL ARTERY WITH DISTAL FLOW GRAFT;  Surgeon: Renford Dills, MD;  Location: [facility] ORS;  Service: Vascular;  Laterality: Right;   LEFT HEART CATH AND CORONARY ANGIOGRAPHY  Left 05/16/2021   Procedure: LEFT HEART CATH AND CORONARY ANGIOGRAPHY;  Surgeon: Armando Reichert, MD;  Location: [facility] INVASIVE CV LAB;  Service: Cardiovascular;  Laterality: Left;   LOWER EXTREMITY ANGIOGRAPHY Right 11/18/2016   Procedure: Lower Extremity Angiography;  Surgeon: Renford Dills, MD;  Location: [facility] INVASIVE CV LAB;  Service: Cardiovascular;  Laterality: Right;   LOWER EXTREMITY ANGIOGRAPHY Right 12/09/2016   Procedure: Lower Extremity Angiography;  Surgeon: Renford Dills, MD;  Location: [facility] INVASIVE CV LAB;  Service: Cardiovascular;  Laterality: Right;   LOWER EXTREMITY ANGIOGRAPHY Right 11/15/2019   Procedure: LOWER EXTREMITY ANGIOGRAPHY;  Surgeon: Renford Dills, MD;  Location: [facility] INVASIVE CV LAB;  Service: Cardiovascular;  Laterality: Right;   LOWER EXTREMITY ANGIOGRAPHY  Right 12/01/2019   Procedure: Lower Extremity Angiography;  Surgeon: Annice Needy, MD;  Location: [facility] INVASIVE CV LAB;  Service: Cardiovascular;  Laterality: Right;   LOWER EXTREMITY ANGIOGRAPHY Right 12/02/2019   Procedure: LOWER EXTREMITY ANGIOGRAPHY;  Surgeon: Renford Dills, MD;  Location: [facility] INVASIVE CV LAB;  Service: Cardiovascular;  Laterality: Right;   LOWER EXTREMITY ANGIOGRAPHY Right 03/23/2020   Procedure: Lower Extremity Angiography;  Surgeon: Renford Dills, MD;  Location: [facility] INVASIVE CV LAB;  Service: Cardiovascular;  Laterality: Right;   LOWER EXTREMITY ANGIOGRAPHY Right 09/25/2021   Procedure: Lower Extremity Angiography;  Surgeon: Renford Dills, MD;  Location: [facility] INVASIVE CV LAB;  Service: Cardiovascular;  Laterality: Right;   LOWER EXTREMITY ANGIOGRAPHY Right 09/26/2021   Procedure: Lower Extremity Angiography;  Surgeon: Annice Needy, MD;  Location: [facility] INVASIVE CV LAB;  Service: Cardiovascular;  Laterality: Right;   LOWER EXTREMITY ANGIOGRAPHY Right 04/08/2022   Procedure: Lower Extremity Angiography;  Surgeon: Renford Dills, MD;  Location: [facility] INVASIVE CV LAB;  Service: Cardiovascular;  Laterality: Right;   LOWER EXTREMITY INTERVENTION  11/18/2016   Procedure: Lower Extremity Intervention;  Surgeon: Renford Dills, MD;  Location: [facility] INVASIVE CV LAB;  Service: Cardiovascular;;   PERIPHERAL VASCULAR CATHETERIZATION Right 01/02/2015   Procedure: Lower Extremity Angiography;  Surgeon: Renford Dills, MD;  Location: [facility] INVASIVE CV LAB;  Service: Cardiovascular;  Laterality: Right;   PERIPHERAL VASCULAR CATHETERIZATION Right 06/18/2015   Procedure: Lower Extremity Angiography;  Surgeon: Annice Needy, MD;  Location: [facility] INVASIVE CV LAB;  Service: Cardiovascular;  Laterality: Right;   PERIPHERAL VASCULAR CATHETERIZATION  06/18/2015   Procedure: Lower Extremity Intervention;  Surgeon: Annice Needy, MD;  Location: [facility] INVASIVE CV LAB;  Service: Cardiovascular;;    PERIPHERAL VASCULAR CATHETERIZATION N/A 10/09/2015   Procedure: Abdominal Aortogram w/Lower Extremity;  Surgeon: Renford Dills, MD;  Location: [facility] INVASIVE CV LAB;  Service: Cardiovascular;  Laterality: N/A;   PERIPHERAL VASCULAR CATHETERIZATION  10/09/2015   Procedure: Lower Extremity Intervention;  Surgeon: Renford Dills, MD;  Location: [facility] INVASIVE CV LAB;  Service: Cardiovascular;;   PERIPHERAL VASCULAR CATHETERIZATION Right 10/10/2015   Procedure: Lower Extremity Angiography;  Surgeon: Annice Needy, MD;  Location: [facility] INV[facility] LAB;  Service: Cardiovascular;  Laterality: Right;   PERIPHERAL VASCULAR CATHETERIZATION Right 10/10/2015   Procedure: Lower Extremity Intervention;  Surgeon: Annice Needy, MD;  Location: [facility] INVAS[facility] CV LAB;  Service: Cardiovascular;  Laterality: Right;   PERIPHERAL VASCULAR CATHETERIZATION Right 12/03/2015   Procedure: Lower Extremity Angiography;  Surgeon: Annice Needy, MD;  Location: [facility] IN[facility] LAB;  Service: Cardiovascular;  Laterality: Right;   PERIPHERAL VASCULAR CATHETERIZATION Right 12/04/2015   Procedure: Lower Extremity Angiography;  Surgeon: Annice Needy, MD;  Location: [facility];  Service: Cardiovascular;  Laterality: Right;   PERIPHERAL VASCULAR CATHETERIZATION  12/04/2015   Procedure: Lower Extremity Intervention;  Surgeon: Annice Needy, MD;  Location: [facility] IN[facility] LAB;  Service: Cardiovascular;;   PERIPHERAL VASCULAR CATHETERIZATION Right 06/30/2016   Procedure: Lower Extremity Angiography;  Surgeon: Annice Needy, MD;  Location: [facility] INVASIVE CV LAB;  Service: Cardiovascular;  Laterality: Right;   PERIPHERAL VASCULAR CATHETERIZATION Right 06/25/2016   Procedure: Lower Extremity Angiography;  Surgeon: Renford Dills, MD;  Location: [facility] INV[facility] LAB;  Service: Cardiovascular;  Laterality: Right;   PERIPHERAL VASCULAR CATHETERIZATION Right 07/01/2016   Procedure: Lower Extremity Angiography;  Surgeon: Renford Dills, MD;   Location: [facility] INVAS[facility] CV LAB;  Service: Cardiovascular;  Laterality: Right;   PERIPHERAL VASCULAR CATHETERIZATION  07/01/2016   Procedure: Lower Extremity Intervention;  Surgeon: Renford Dills, MD;  Location: [facility] INVAS[facility] CV LAB;  Service: Cardiovascular;;   PERIPHERAL VASCULAR CATHETERIZATION Right 08/01/2016   Procedure: Lower Extremity Angiography;  Surgeon: Renford Dills, MD;  Location: [facility] INVASIVE CV LAB;  Service: Cardiovascular;  Laterality: Right;   PORTA CATH INSERTION N/A 10/05/2020   Procedure: PORTA CATH INSERTION;  Surgeon: Renford Dills, MD;  Location: [facility] INVASIVE CV LAB;  Service: Cardiovascular;  Laterality: N/A;   stent placement in right leg Right    VIDEO BRONCHOSCOPY N/A 09/14/2020   Procedure: VIDEO BRONCHOSCOPY WITH FLUORO;  Surgeon: Salena Saner, MD;  Location: [facility] ORS;  Service: Cardiopulmonary;  Laterality: N/A;   VIDEO BRONCHOSCOPY WITH ENDOBRONCHIAL ULTRASOUND N/A 09/14/2020   Procedure: VIDEO BRONCHOSCOPY WITH ENDOBRONCHIAL ULTRASOUND;  Surgeon: Salena Saner, MD;  Location: [facility] ORS;  Service: Cardiopulmonary;  Laterality: N/A;    SOCIAL HISTORY: Social History   Socioeconomic History   Marital status: Single    Spouse name: Not on file   Number of children: Not on file   Years of education: Not on file   Highest education level: Not on file  Occupational History   Occupation: unemployed  Tobacco Use   Smoking status: Former    Packs/day: 2.00    Years: 38.00    Additional pack years: 0.00    Total pack years: 76.00    Types: Cigarettes    Quit date: 12/20/2014    Years since quitting: 8.0   Smokeless tobacco: Never   Tobacco comments:    quit smoking in 2017 most smoked 2   Vaping Use   Vaping Use: Former  Substance and Sexual Activity   Alcohol use: Not Currently   Drug use: No   Sexual activity: Yes  Other Topics Concern   Not on file  Social History Narrative   Not on file   Social Determinants of Health    Financial Resource Strain: Not on file  Food Insecurity: Not on file  Transportation Needs: Not on file  Physical Activity: Not on file  Stress: Not on file  Social Connections: Not on file  Intimate Partner Violence: Not on file    FAMILY HISTORY: Family History  Problem Relation Age of Onset   Diabetes Mother    Hypertension Mother    Heart murmur Mother    Leukemia Mother    Throat cancer Maternal Grandmother    Colon cancer Neg Hx    Breast cancer Neg Hx     ALLERGIES:  has No Known Allergies.  MEDICATIONS:  Current Outpatient Medications  Medication Sig Dispense Refill   aspirin EC 81 MG tablet Take  81 mg by mouth daily. Swallow whole.     clopidogrel (PLAVIX) 75 MG tablet TAKE 1 TABLET BY MOUTH DAILY 100 tablet 2   levothyroxine (SYNTHROID) 100 MCG tablet TAKE 1 TABLET BY MOUTH ONCE DAILY BEFORE BREAKFAST 90 tablet 0   simvastatin (ZOCOR) 40 MG tablet Take 40 mg by mouth at bedtime.     No current facility-administered medications for this visit.   Facility-Administered Medications Ordered in Other Visits  Medication Dose Route Frequency Provider Last Rate Last Admin   albuterol (PROVENTIL) (2.5 MG/3ML) 0.083% nebulizer solution 2.5 mg  2.5 mg Nebulization Once Salena Saner, MD         PHYSICAL EXAMINATION: ECOG PERFORMANCE STATUS: 1 - Symptomatic but completely ambulatory Vitals:   12/25/22 0842  BP: (!) 144/84  Pulse: 98  Resp: 18  Temp: 97.6 F (36.4 C)  SpO2: 99%   Filed Weights   12/25/22 0842  Weight: 222 lb 9.6 oz (101 kg)    Physical Exam Constitutional:      General: He is not in acute distress.    Appearance: He is obese. He is not diaphoretic.     Comments: Patient walks with a cane  HENT:     Head: Normocephalic and atraumatic.  Eyes:     General: No scleral icterus. Cardiovascular:     Rate and Rhythm: Normal rate and regular rhythm.     Heart sounds: No murmur heard. Pulmonary:     Effort: Pulmonary effort is normal. No  respiratory distress.  Abdominal:     General: There is no distension.     Palpations: Abdomen is soft.     Tenderness: There is no abdominal tenderness.  Musculoskeletal:     Cervical back: Normal range of motion and neck supple.  Skin:    General: Skin is warm and dry.     Findings: No erythema.  Neurological:     Mental Status: He is alert and oriented to person, place, and time. Mental status is at baseline.     Cranial Nerves: No cranial nerve deficit.     Motor: No abnormal muscle tone.  Psychiatric:        Mood and Affect: Mood and affect normal.      LABORATORY DATA:  I have reviewed the data as listed    Latest Ref Rng & Units 12/25/2022    8:23 AM 12/11/2022    8:50 AM 08/29/2022   10:08 AM  CBC  WBC 4.0 - 10.5 K/uL 5.1  5.1  3.9   Hemoglobin 13.0 - 17.0 g/dL 84.6  96.2  95.2   Hematocrit 39.0 - 52.0 % 41.4  40.1  38.3   Platelets 150 - 400 K/uL 252  204  224       Latest Ref Rng & Units 12/11/2022    8:50 AM 11/06/2022   10:41 AM 08/29/2022   10:08 AM  CMP  Glucose 70 - 99 mg/dL 841  324  401   BUN 8 - 23 mg/dL 15  24  15    Creatinine 0.61 - 1.24 mg/dL 0.27  2.53  6.64   Sodium 135 - 145 mmol/L 142  142  138   Potassium 3.5 - 5.1 mmol/L 3.5  3.9  3.6   Chloride 98 - 111 mmol/L 107  108  102   CO2 22 - 32 mmol/L 24  27  25    Calcium 8.9 - 10.3 mg/dL 8.8  9.4  8.9   Total Protein 6.5 -  8.1 g/dL 6.7   6.9   Total Bilirubin 0.3 - 1.2 mg/dL 0.4   0.4   Alkaline Phos 38 - 126 U/L 76   72   AST 15 - 41 U/L 25   29   ALT 0 - 44 U/L 21   26      RADIOGRAPHIC STUDIES: I have personally reviewed the radiological images as listed and agreed with the findings in the report. CT CHEST ABDOMEN PELVIS W CONTRAST  Result Date: 11/27/2022 CLINICAL DATA:  Right-sided lung cancer, squamous cell carcinoma. Staging. * Tracking Code: BO * EXAM: CT CHEST, ABDOMEN, AND PELVIS WITH CONTRAST TECHNIQUE: Multidetector CT imaging of the chest, abdomen and pelvis was performed following  the standard protocol during bolus administration of intravenous contrast. RADIATION DOSE REDUCTION: This exam was performed according to the departmental dose-optimization program which includes automated exposure control, adjustment of the mA and/or kV according to patient size and/or use of iterative reconstruction technique. CONTRAST:  OMNIPAQUE IOHEXOL 300 MG/ML  SOLN COMPARISON:  CT 08/25/2022 and older FINDINGS: CT CHEST FINDINGS Cardiovascular: Right upper chest port. Heart is nonenlarged. No pericardial effusion. Slight pericardial thickening. The thoracic aorta has a normal course and caliber. Mediastinum/Nodes: Slightly patulous thoracic esophagus. Small thyroid gland. No specific abnormal lymph node enlargement seen in the axillary regions, hilum or mediastinum. Lungs/Pleura: Left lung is without consolidation, pneumothorax or effusion. Previously there was some patchy ground-glass in the left lung with some reticular changes which has significantly improved. There is some subtle changes remaining in the left lower lobe dependently. The specific areas are increased from previous but overall the amount in the left lung is markedly improved. The right lung demonstrates some volume loss with thickening and bronchiectasis with some distortion right perihilar involving the right upper lobe which could be posttreatment changes. There are some changes as well there are similar and stable along the superior segment of the right lower lobe. No right-sided new consolidation, pneumothorax or effusion. No new dominant nodule. The previous reticulonodular changes in the right lung on the prior have also substantially improved. Musculoskeletal: Fixation hardware seen along the lower cervical spine at the edge of the imaging field. Scattered endplate osteophytes are seen throughout the thoracic spine with multilevel stenosis. Old left-sided lateral rib fractures inferiorly. CT ABDOMEN PELVIS FINDINGS  Hepatobiliary: Mild fatty liver infiltration. The gallbladder is nondilated. Patent portal vein. No space-occupying liver lesion. Pancreas: Unremarkable. No pancreatic ductal dilatation or surrounding inflammatory changes. Spleen: Normal in size without focal abnormality. Adrenals/Urinary Tract: The adrenal glands are preserved. Mild bilateral renal atrophy. No enhancing renal mass or collecting system dilatation. The ureters have normal course and caliber down to the bladder. Preserved contours of the urinary bladder. Stomach/Bowel: On this non oral contrast exam, large bowel has a normal course and caliber. Scattered stool. Left-sided colonic diverticulosis. Normal appendix in the right lower quadrant. There is some debris in the stomach. Small bowel is nondilated. Vascular/Lymphatic: Aortic atherosclerosis. No enlarged abdominal or pelvic lymph nodes. There are areas of stenosis suggested along the internal iliac vessels. At the edge of the imaging field are evidence of bypass grafts along the right thigh which appears occluded. SFA appears occluded. Please correlate with clinical history. Stable prominent right inguinal node on series 2, image 142. No retroperitoneal or other abdominal nodal enlargement. Reproductive: Prostate is unremarkable. Other: No abdominal wall hernia or abnormality. No abdominopelvic ascites. Musculoskeletal: Degenerative changes seen of the spine and pelvis. There is streak artifact related to the  fixation hardware at the lumbosacral spine. IMPRESSION: Improving bilateral scattered interstitial and ground-glass opacities. Small amount of residual areas in the extreme left lower lobe inferiorly. This specific area is slightly increased from previous but overall the amount of opacities is significantly improved. Recommend continued follow-up. No developing new mass lesion, fluid collection or lymph node enlargement. Stable posttreatment changes right perihilar in the lung. Fatty liver  infiltration. Colonic diverticulosis. Improved tiny right pleural effusion Electronically Signed   By: Karen Kays M.D.   On: 11/27/2022 16:15   MR Brain W Wo Contrast  Result Date: 11/06/2022 CLINICAL DATA:  Brain metastases, assess treatment response 3T SRS Protocol for radiation treatment planning. EXAM: MRI HEAD WITHOUT AND WITH CONTRAST TECHNIQUE: Multiplanar, multiecho pulse sequences of the brain and surrounding structures were obtained without and with intravenous contrast. CONTRAST:  10 mL of Vueway COMPARISON:  MRI head October 14, 2022. FINDINGS: Brain: Similar size of proximally 21 mm peripherally enhancing lesion in the left frontal lobe (series 13, image 113) and 14 mm peripherally enhancing lesion in the left parietal lobe (series 13, image 144). Likely slight increase of 17 mm lesion the right occipital lobe (series 13, image 49). Likely slight increase in 7 mm enhancing lesion left cerebellum. Similar small right cerebellar lesion (image 44). No evidence of acute infarct, acute hemorrhage, midline shift or hydrocephalus. Vascular: Major arterial flow voids are maintained at the skull base. Skull and upper cervical spine: Normal marrow signal. Sinuses/Orbits: Largely clear sinuses.  No acute orbital findings. Other: Partially imaged probable large lymph node in the left upper neck. IMPRESSION: 1. Likely slight increase and right occipital and left cerebellar lesions. Otherwise, unchanged metastases, as above. No new lesions identified. 2. Partially imaged large probable lymph node in the left upper neck, apparent on prior PET CT. Electronically Signed   By: Feliberto Harts M.D.   On: 11/06/2022 10:24   MR BRAIN W WO CONTRAST  Result Date: 10/15/2022 CLINICAL DATA:  CNS neoplasm. SRS treatment follow-up. Weakness and difficulty walking. EXAM: MRI HEAD WITHOUT AND WITH CONTRAST TECHNIQUE: Multiplanar, multiecho pulse sequences of the brain and surrounding structures were obtained without and  with intravenous contrast. CONTRAST:  9mL GADAVIST GADOBUTROL 1 MMOL/ML IV SOLN COMPARISON:  07/18/2022 FINDINGS: Brain: No acute infarct or hemorrhage. Small amount of chronic blood associated with lesion of the left frontal operculum. There are multiple intraparenchymal lesions: 1. Left cerebellum, 5 mm, previously 2 mm (series 1022, image 144) 2. Right cerebellum, 3 mm, unchanged, image 131 3. Right occipital lobe, 17 mm, previously 8 mm, image 125 4. Left frontal operculum d, 23 mm, previously 20 mm, image 73 5. Left postcentral gyrus 13 mm, previously 7 mm, image 36 All of these lesions demonstrate a ring enhancement type pattern. Surrounding vasogenic edema has worsened at all lesion sites Vascular: Normal flow voids. Skull and upper cervical spine: Normal marrow signal. Sinuses/Orbits: Negative. Other: None. IMPRESSION: Interval increase in size of multiple intraparenchymal lesions with worsened surrounding vasogenic edema. 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 No new lesions. Electronically Signed   By: Deatra Robinson M.D.   On: 10/15/2022 22:11

## 2022-12-25 NOTE — Assessment & Plan Note (Signed)
Continue Synthroid 100 mcg daily

## 2022-12-25 NOTE — Patient Instructions (Signed)
[facility]  Discharge Instructions: Thank you for choosing [facility] to provide your oncology and hematology care.  If you have a lab appointment with the Cancer Center, please go directly to the Cancer Center and check in at the registration area.  Wear comfortable clothing and clothing appropriate for easy access to any Portacath or PICC line.   We strive to give you quality time with your provider. You may need to reschedule your appointment if you arrive late (15 or more minutes).  Arriving late affects you and other patients whose appointments are after yours.  Also, if you miss three or more appointments without notifying the office, you may be dismissed from the clinic at the provider's discretion.      For prescription refill requests, have your pharmacy contact our office and allow 72 hours for refills to be completed.    Today you received the following chemotherapy and/or immunotherapy agents: durvalumab    To help prevent nausea and vomiting after your treatment, we encourage you to take your nausea medication as directed.  BELOW ARE SYMPTOMS THAT SHOULD BE REPORTED IMMEDIATELY: *FEVER GREATER THAN 100.4 F (38 C) OR HIGHER *CHILLS OR SWEATING *NAUSEA AND VOMITING THAT IS NOT CONTROLLED WITH YOUR NAUSEA MEDICATION *UNUSUAL SHORTNESS OF BREATH *UNUSUAL BRUISING OR BLEEDING *URINARY PROBLEMS (pain or burning when urinating, or frequent urination) *BOWEL PROBLEMS (unusual diarrhea, constipation, pain near the anus) TENDERNESS IN MOUTH AND THROAT WITH OR WITHOUT PRESENCE OF ULCERS (sore throat, sores in mouth, or a toothache) UNUSUAL RASH, SWELLING OR PAIN  UNUSUAL VAGINAL DISCHARGE OR ITCHING   Items with * indicate a potential emergency and should be followed up as soon as possible or go to the Emergency Department if any problems should occur.  Please show the CHEMOTHERAPY ALERT CARD or IMMUNOTHERAPY ALERT CARD at check-in to  the Emergency Department and triage nurse.  Should you have questions after your visit or need to cancel or reschedule your appointment, please contact [facility]  [phone] and follow the prompts.  Office hours are 8:00 a.m. to 4:30 p.m. Monday - Friday. Please note that voicemails left after 4:00 p.m. may not be returned until the following business day.  We are closed weekends and major holidays. You have access to a nurse at all times for urgent questions. Please call the main number to the clinic [phone] and follow the prompts.  For any non-urgent questions, you may also contact your provider using MyChart. We now offer e-Visits for anyone 18 and older to request care online for non-urgent symptoms. For details visit [email or web address].   Also download the MyChart app! Go to the app store, search "MyChart", open the app, select [facility], and log in with your MyChart username and password.    

## 2022-12-25 NOTE — Assessment & Plan Note (Signed)
Recommend patient to take Calcium 1200mg daily.  

## 2022-12-25 NOTE — Assessment & Plan Note (Signed)
Status post radiation,  02/19/22 MRI showed decreased brain lesion size and edema.  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 small new left anterior cerebellar 4-5 mm metastasis  March 2024  MRI brain Interval increase in size of multiple intraparenchymal lesions with worsened surrounding vasogenic edema- resumed on Dexamethasone 2mg daily.  Neurology Dr. Vaslow recommends radiation treatments due to increase size of these lesions.  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 Moody.   

## 2022-12-25 NOTE — Assessment & Plan Note (Signed)
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  May 2024 CT scan showed Improving bilateral scattered interstitial and ground-glass opacities. Discussed with pulmonology Dr. Gonzalez. 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 Proceed with Durvalumab.   

## 2022-12-25 NOTE — Patient Instructions (Signed)

## 2022-12-27 LAB — T4: T4, Total: 11.1 ug/dL (ref 4.5–12.0)

## 2023-01-05 ENCOUNTER — Encounter (INDEPENDENT_AMBULATORY_CARE_PROVIDER_SITE_OTHER): Payer: Medicare Other

## 2023-01-05 ENCOUNTER — Encounter (INDEPENDENT_AMBULATORY_CARE_PROVIDER_SITE_OTHER): Payer: Self-pay | Admitting: Vascular Surgery

## 2023-01-05 ENCOUNTER — Ambulatory Visit (INDEPENDENT_AMBULATORY_CARE_PROVIDER_SITE_OTHER): Payer: Medicare Other

## 2023-01-05 ENCOUNTER — Ambulatory Visit (INDEPENDENT_AMBULATORY_CARE_PROVIDER_SITE_OTHER): Payer: Medicare Other | Admitting: Vascular Surgery

## 2023-01-05 VITALS — BP 127/76 | HR 102 | Resp 16 | Wt 224.0 lb

## 2023-01-05 DIAGNOSIS — Z9889 Other specified postprocedural states: Secondary | ICD-10-CM | POA: Diagnosis not present

## 2023-01-05 DIAGNOSIS — M5416 Radiculopathy, lumbar region: Secondary | ICD-10-CM

## 2023-01-05 DIAGNOSIS — I739 Peripheral vascular disease, unspecified: Secondary | ICD-10-CM

## 2023-01-05 DIAGNOSIS — I70219 Atherosclerosis of native arteries of extremities with intermittent claudication, unspecified extremity: Secondary | ICD-10-CM | POA: Diagnosis not present

## 2023-01-05 DIAGNOSIS — J449 Chronic obstructive pulmonary disease, unspecified: Secondary | ICD-10-CM

## 2023-01-05 DIAGNOSIS — E782 Mixed hyperlipidemia: Secondary | ICD-10-CM

## 2023-01-05 NOTE — Progress Notes (Signed)
MRN : [id number]  Joshua [name] is a 67 y.o. (06-03-1956) male who presents with chief complaint of check circulation.  History of Present Illness:   The patient returns to the office for followup and review status post angiogram with intervention on 04/08/2022.    Procedure: Percutaneous transluminal angioplasty right profunda femoris artery Mechanical thrombectomy of the right profunda femoris artery using the Rota Rex catheter.    The patient notes improvement in the lower extremity symptoms. No interval shortening of the patient's claudication distance or rest pain symptoms. No new ulcers or wounds have occurred since the last visit.   There have been no significant changes to the patient's overall health care.   No documented history of amaurosis fugax or recent TIA symptoms. There are no recent neurological changes noted. No documented history of DVT, PE or superficial thrombophlebitis. The patient denies recent episodes of angina or shortness of breath.    ABI's Rt=0.45 and Lt=1.03  (previous ABI's Rt=0.63 and Lt=1.11 )  No outpatient medications have been marked as taking for the 01/05/23 encounter (Appointment) with Gilda Crease, Latina Craver, MD.    Past Medical History:  Diagnosis Date   Allergy    Cancer (HCC)    lung   COPD (chronic obstructive pulmonary disease) (HCC)    Dyspnea    former smoker. only doe   History of kidney stones    Hypertension    Peripheral vascular disease (HCC)    Pre-diabetes    Sleep apnea     Past Surgical History:  Procedure Laterality Date   ANTERIOR CERVICAL DECOMP/DISCECTOMY FUSION N/A 03/14/2020   Procedure: ANTERIOR CERVICAL DECOMPRESSION/DISCECTOMY FUSION 3 LEVELS C4-7;  Surgeon: Venetia Night, MD;  Location: [facility] ORS;  Service: Neurosurgery;  Laterality: N/A;   BACK SURGERY  2011   CENTRAL LINE INSERTION Right 09/10/2016   Procedure: CENTRAL LINE  INSERTION;  Surgeon: Renford Dills, MD;  Location: [facility];  Service: Vascular;  Laterality: Right;   CERVICAL FUSION     C4-c7 on Mar 14, 2020   COLONOSCOPY  2013 ?   COLONOSCOPY N/A 09/09/2021   Procedure: COLONOSCOPY;  Surgeon: Jaynie Collins, DO;  Location: [facility] ENDOSCOPY;  Service: Gastroenterology;  Laterality: N/A;   CORONARY ANGIOPLASTY     ESOPHAGOGASTRODUODENOSCOPY N/A 09/09/2021   Procedure: ESOPHAGOGASTRODUODENOSCOPY (EGD);  Surgeon: Jaynie Collins, DO;  Location: [facility] ENDOSCOPY;  Service: Gastroenterology;  Laterality: N/A;   FEMORAL-POPLITEAL BYPASS GRAFT Right 09/10/2016   Procedure: BYPASS GRAFT FEMORAL-POPLITEAL ARTERY ( BELOW KNEE );  Surgeon: Renford Dills, MD;  Location: [facility];  Service: Vascular;  Laterality: Right;   FEMORAL-TIBIAL BYPASS GRAFT Right 03/23/2020   Procedure: BYPASS GRAFT RIGHT FEMORAL- DISTAL TIBIAL ARTERY WITH DISTAL FLOW GRAFT;  Surgeon: Renford Dills, MD;  Location: [facility];  Service: Vascular;  Laterality: Right;   LEFT HEART CATH AND CORONARY ANGIOGRAPHY Left 05/16/2021   Procedure: LEFT HEART CATH AND CORONARY ANGIOGRAPHY;  Surgeon: Armando Reichert, MD;  Location: [facility] INVASIVE CV LAB;  Service: Cardiovascular;  Laterality: Left;   LOWER EXTREMITY ANGIOGRAPHY Right 11/18/2016   Procedure: Lower  Extremity Angiography;  Surgeon: Renford Dills, MD;  Location: [facility] INVASIVE CV LAB;  Service: Cardiovascular;  Laterality: Right;   LOWER EXTREMITY ANGIOGRAPHY Right 12/09/2016   Procedure: Lower Extremity Angiography;  Surgeon: Renford Dills, MD;  Location: [facility] INVASIVE CV LAB;  Service: Cardiovascular;  Laterality: Right;   LOWER EXTREMITY ANGIOGRAPHY Right 11/15/2019   Procedure: LOWER EXTREMITY ANGIOGRAPHY;  Surgeon: Renford Dills, MD;  Location: [facility] INVASIVE CV LAB;  Service: Cardiovascular;  Laterality: Right;   LOWER EXTREMITY ANGIOGRAPHY Right 12/01/2019   Procedure: Lower Extremity Angiography;  Surgeon:  Annice Needy, MD;  Location: [facility] INVASIVE CV LAB;  Service: Cardiovascular;  Laterality: Right;   LOWER EXTREMITY ANGIOGRAPHY Right 12/02/2019   Procedure: LOWER EXTREMITY ANGIOGRAPHY;  Surgeon: Renford Dills, MD;  Location: [facility] INVASIVE CV LAB;  Service: Cardiovascular;  Laterality: Right;   LOWER EXTREMITY ANGIOGRAPHY Right 03/23/2020   Procedure: Lower Extremity Angiography;  Surgeon: Renford Dills, MD;  Location: [facility] INVASIVE CV LAB;  Service: Cardiovascular;  Laterality: Right;   LOWER EXTREMITY ANGIOGRAPHY Right 09/25/2021   Procedure: Lower Extremity Angiography;  Surgeon: Renford Dills, MD;  Location: [facility] INVASIVE CV LAB;  Service: Cardiovascular;  Laterality: Right;   LOWER EXTREMITY ANGIOGRAPHY Right 09/26/2021   Procedure: Lower Extremity Angiography;  Surgeon: Annice Needy, MD;  Location: [facility] INVASIVE CV LAB;  Service: Cardiovascular;  Laterality: Right;   LOWER EXTREMITY ANGIOGRAPHY Right 04/08/2022   Procedure: Lower Extremity Angiography;  Surgeon: Renford Dills, MD;  Location: [facility] INVASIVE CV LAB;  Service: Cardiovascular;  Laterality: Right;   LOWER EXTREMITY INTERVENTION  11/18/2016   Procedure: Lower Extremity Intervention;  Surgeon: Renford Dills, MD;  Location: [facility] INVASIVE CV LAB;  Service: Cardiovascular;;   PERIPHERAL VASCULAR CATHETERIZATION Right 01/02/2015   Procedure: Lower Extremity Angiography;  Surgeon: Renford Dills, MD;  Location: [facility] INVASIVE CV LAB;  Service: Cardiovascular;  Laterality: Right;   PERIPHERAL VASCULAR CATHETERIZATION Right 06/18/2015   Procedure: Lower Extremity Angiography;  Surgeon: Annice Needy, MD;  Location: [facility] INVASIVE CV LAB;  Service: Cardiovascular;  Laterality: Right;   PERIPHERAL VASCULAR CATHETERIZATION  06/18/2015   Procedure: Lower Extremity Intervention;  Surgeon: Annice Needy, MD;  Location: [facility] INVASIVE CV LAB;  Service: Cardiovascular;;   PERIPHERAL VASCULAR CATHETERIZATION N/A 10/09/2015   Procedure:  Abdominal Aortogram w/Lower Extremity;  Surgeon: Renford Dills, MD;  Location: [facility] INVASIVE CV LAB;  Service: Cardiovascular;  Laterality: N/A;   PERIPHERAL VASCULAR CATHETERIZATION  10/09/2015   Procedure: Lower Extremity Intervention;  Surgeon: Renford Dills, MD;  Location: [facility] INVASIVE CV LAB;  Service: Cardiovascular;;   PERIPHERAL VASCULAR CATHETERIZATION Right 10/10/2015   Procedure: Lower Extremity Angiography;  Surgeon: Annice Needy, MD;  Location: [facility] INVASIVE CV LAB;  Service: Cardiovascular;  Laterality: Right;   PERIPHERAL VASCULAR CATHETERIZATION Right 10/10/2015   Procedure: Lower Extremity Intervention;  Surgeon: Annice Needy, MD;  Location: [facility] INVASIVE CV LAB;  Service: Cardiovascular;  Laterality: Right;   PERIPHERAL VASCULAR CATHETERIZATION Right 12/03/2015   Procedure: Lower Extremity Angiography;  Surgeon: Annice Needy, MD;  Location: [facility] INVASIVE CV LAB;  Service: Cardiovascular;  Laterality: Right;   PERIPHERAL VASCULAR CATHETERIZATION Right 12/04/2015   Procedure: Lower Extremity Angiography;  Surgeon: Annice Needy, MD;  Location: [facility] INV[facility];  Service: Cardiovascular;  Laterality: Right;   PERIPHERAL VASCULAR CATHETERIZATION  12/04/2015   Procedure: Lower Extremity Intervention;  Surgeon: Annice Needy, MD;  Location: [facility] INVASIVE CV LAB;  Service: Cardiovascular;;  PERIPHERAL VASCULAR CATHETERIZATION Right 06/30/2016   Procedure: Lower Extremity Angiography;  Surgeon: Annice Needy, MD;  Location: [facility] INVASIVE [facility] LAB;  Service: Cardiovascular;  Laterality: Right;   PERIPHERAL VASCULAR CATHETERIZATION Right 06/25/2016   Procedure: Lower Extremity Angiography;  Surgeon: Renford Dills, MD;  Location: [facility] INVAS[facility] LAB;  Service: Cardiovascular;  Laterality: Right;   PERIPHERAL VASCULAR CATHETERIZATION Right 07/01/2016   Procedure: Lower Extremity Angiography;  Surgeon: Renford Dills, MD;  Location: [facility] INV[facility];  Service: Cardiovascular;   Laterality: Right;   PERIPHERAL VASCULAR CATHETERIZATION  07/01/2016   Procedure: Lower Extremity Intervention;  Surgeon: Renford Dills, MD;  Location: [facility] INV[facility] CV LAB;  Service: Cardiovascular;;   PERIPHERAL VASCULAR CATHETERIZATION Right 08/01/2016   Procedure: Lower Extremity Angiography;  Surgeon: Renford Dills, MD;  Location: [facility] INVASIVE CV LAB;  Service: Cardiovascular;  Laterality: Right;   PORTA CATH INSERTION N/A 10/05/2020   Procedure: PORTA CATH INSERTION;  Surgeon: Renford Dills, MD;  Location: [facility] INVASIVE CV LAB;  Service: Cardiovascular;  Laterality: N/A;   stent placement in right leg Right    VIDEO BRONCHOSCOPY N/A 09/14/2020   Procedure: VIDEO BRONCHOSCOPY WITH FLUORO;  Surgeon: Salena Saner, MD;  Location: [facility];  Service: Cardiopulmonary;  Laterality: N/A;   VIDEO BRONCHOSCOPY WITH ENDOBRONCHIAL ULTRASOUND N/A 09/14/2020   Procedure: VIDEO BRONCHOSCOPY WITH ENDOBRONCHIAL ULTRASOUND;  Surgeon: Salena Saner, MD;  Location: [facility];  Service: Cardiopulmonary;  Laterality: N/A;    Social History Social History   Tobacco Use   Smoking status: Former    Packs/day: 2.00    Years: 38.00    Additional pack years: 0.00    Total pack years: 76.00    Types: Cigarettes    Quit date: 12/20/2014    Years since quitting: 8.0   Smokeless tobacco: Never   Tobacco comments:    quit smoking in 2017 most smoked 2   Vaping Use   Vaping Use: Former  Substance Use Topics   Alcohol use: Not Currently   Drug use: No    Family History Family History  Problem Relation Age of Onset   Diabetes Mother    Hypertension Mother    Heart murmur Mother    Leukemia Mother    Throat cancer Maternal Grandmother    Colon cancer Neg Hx    Breast cancer Neg Hx     No Known Allergies   REVIEW OF SYSTEMS (Negative unless checked)  Constitutional: [] Weight loss  [] Fever  [] Chills Cardiac: [] Chest pain   [] Chest pressure   [] Palpitations   [] Shortness of  breath when laying flat   [] Shortness of breath with exertion. Vascular:  [x] Pain in legs with walking   [] Pain in legs at rest  [] History of DVT   [] Phlebitis   [] Swelling in legs   [] Varicose veins   [] Non-healing ulcers Pulmonary:   [] Uses home oxygen   [] Productive cough   [] Hemoptysis   [] Wheeze  [] COPD   [] Asthma Neurologic:  [] Dizziness   [] Seizures   [] History of stroke   [] History of TIA  [] Aphasia   [] Vissual changes   [] Weakness or numbness in arm   [] Weakness or numbness in leg Musculoskeletal:   [] Joint swelling   [] Joint pain   [] Low back pain Hematologic:  [] Easy bruising  [] Easy bleeding   [] Hypercoagulable state   [] Anemic Gastrointestinal:  [] Diarrhea   [] Vomiting  [] Gastroesophageal reflux/heartburn   [] Difficulty swallowing. Genitourinary:  [] Chronic kidney disease   [] Difficult urination  [] Frequent  urination   [] Blood in urine Skin:  [] Rashes   [] Ulcers  Psychological:  [] History of anxiety   []  History of major depression.  Physical Examination  There were no vitals filed for this visit. There is no height or weight on file to calculate BMI. Gen: WD/WN, NAD Head: [place]/AT, No temporalis wasting.  Ear/Nose/Throat: Hearing grossly intact, nares w/o erythema or drainage Eyes: PER, EOMI, sclera nonicteric.  Neck: Supple, no masses.  No bruit or JVD.  Pulmonary:  Good air movement, no audible wheezing, no use of accessory muscles.  Cardiac: RRR, normal S1, S2, no Murmurs. Vascular:  mild trophic changes, no open wounds Vessel Right Left  Radial Palpable Palpable  PT Not Palpable Palpable  DP Not Palpable  Palpable  Gastrointestinal: soft, non-distended. No guarding/no peritoneal signs.  Musculoskeletal: M/S 5/5 throughout.  No visible deformity.  Neurologic: CN 2-12 intact. Pain and light touch intact in extremities.  Symmetrical.  Speech is fluent. Motor exam as listed above. Psychiatric: Judgment intact, Mood & affect appropriate for pt's clinical  situation. Dermatologic: No rashes or ulcers noted.  No changes consistent with cellulitis.   CBC Lab Results  Component Value Date   WBC 5.1 12/25/2022   HGB 13.4 12/25/2022   HCT 41.4 12/25/2022   MCV 84.0 12/25/2022   PLT 252 12/25/2022    BMET    Component Value Date/Time   NA 140 12/25/2022 0823   NA 141 10/14/2019 1101   K 3.7 12/25/2022 0823   CL 106 12/25/2022 0823   CO2 24 12/25/2022 0823   GLUCOSE 136 (H) 12/25/2022 0823   BUN 12 12/25/2022 0823   BUN 15 10/14/2019 1101   BUN 16 07/03/2014 1205   CREATININE 1.04 12/25/2022 0823   CREATININE 1.11 11/06/2022 1041   CREATININE 1.34 (H) 07/03/2014 1205   CALCIUM 9.0 12/25/2022 0823   GFRNONAA >60 12/25/2022 0823   GFRNONAA >60 11/06/2022 1041   GFRNONAA 59 (L) 07/03/2014 1205   GFRAA >60 03/30/2020 0536   GFRAA >60 07/03/2014 1205   Estimated Creatinine Clearance: 86.9 mL/min (by C-G formula based on SCr of 1.04 mg/dL).  COAG Lab Results  Component Value Date   INR 1.2 09/26/2021   INR 1.2 08/25/2020   INR 1.0 03/05/2020    Radiology No results found.   Assessment/Plan 1. 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  The patient does not voice lifestyle limiting changes at this point in time.  Noninvasive studies do not suggest clinically significant change.  No invasive studies, angiography or surgery at this time The patient should continue walking and begin a more formal exercise program.  The patient should continue antiplatelet therapy and aggressive treatment of the lipid abnormalities  No changes in the patient's medications at this time  Continued surveillance is indicated as atherosclerosis is likely to progress with time.    The patient will continue follow up with noninvasive studies as ordered.  - VAS [place] ABI WITH/WO TBI; Future  2. Chronic obstructive pulmonary  disease, unspecified COPD type (HCC) Continue pulmonary medications and aerosols as already ordered, these medications have been reviewed and there are no changes at this time.   3. Mixed hyperlipidemia Continue statin as ordered and reviewed, no changes at this time  4. Radiculopathy of lumbar region Continue NSAID medications as already ordered, these medications have been reviewed and there are no changes at this time.  Continued activity  and therapy was stressed.    Levora Dredge, MD  01/05/2023 12:50 PM

## 2023-01-06 ENCOUNTER — Other Ambulatory Visit: Payer: Self-pay

## 2023-01-08 ENCOUNTER — Inpatient Hospital Stay: Payer: Medicare Other

## 2023-01-08 ENCOUNTER — Inpatient Hospital Stay (HOSPITAL_BASED_OUTPATIENT_CLINIC_OR_DEPARTMENT_OTHER): Payer: Medicare Other | Admitting: Oncology

## 2023-01-08 ENCOUNTER — Encounter: Payer: Self-pay | Admitting: Oncology

## 2023-01-08 VITALS — BP 145/80 | HR 88 | Temp 97.1°F | Resp 18 | Wt 222.8 lb

## 2023-01-08 DIAGNOSIS — E032 Hypothyroidism due to medicaments and other exogenous substances: Secondary | ICD-10-CM | POA: Diagnosis not present

## 2023-01-08 DIAGNOSIS — Z5112 Encounter for antineoplastic immunotherapy: Secondary | ICD-10-CM

## 2023-01-08 DIAGNOSIS — C7931 Secondary malignant neoplasm of brain: Secondary | ICD-10-CM | POA: Diagnosis not present

## 2023-01-08 DIAGNOSIS — C3491 Malignant neoplasm of unspecified part of right bronchus or lung: Secondary | ICD-10-CM

## 2023-01-08 LAB — CBC WITH DIFFERENTIAL/PLATELET
Abs Immature Granulocytes: 0.01 10*3/uL (ref 0.00–0.07)
Basophils Absolute: 0.1 10*3/uL (ref 0.0–0.1)
Basophils Relative: 1 %
Eosinophils Absolute: 0.2 10*3/uL (ref 0.0–0.5)
Eosinophils Relative: 4 %
HCT: 38.7 % — ABNORMAL LOW (ref 39.0–52.0)
Hemoglobin: 12.8 g/dL — ABNORMAL LOW (ref 13.0–17.0)
Immature Granulocytes: 0 %
Lymphocytes Relative: 19 %
Lymphs Abs: 1.1 10*3/uL (ref 0.7–4.0)
MCH: 27.4 pg (ref 26.0–34.0)
MCHC: 33.1 g/dL (ref 30.0–36.0)
MCV: 82.9 fL (ref 80.0–100.0)
Monocytes Absolute: 0.7 10*3/uL (ref 0.1–1.0)
Monocytes Relative: 13 %
Neutro Abs: 3.5 10*3/uL (ref 1.7–7.7)
Neutrophils Relative %: 63 %
Platelets: 221 10*3/uL (ref 150–400)
RBC: 4.67 MIL/uL (ref 4.22–5.81)
RDW: 14.5 % (ref 11.5–15.5)
WBC: 5.6 10*3/uL (ref 4.0–10.5)
nRBC: 0 % (ref 0.0–0.2)

## 2023-01-08 LAB — VAS US ABI WITH/WO TBI
Left ABI: 1.03
Right ABI: 0.45

## 2023-01-08 LAB — COMPREHENSIVE METABOLIC PANEL
ALT: 20 U/L (ref 0–44)
AST: 25 U/L (ref 15–41)
Albumin: 3.8 g/dL (ref 3.5–5.0)
Alkaline Phosphatase: 62 U/L (ref 38–126)
Anion gap: 10 (ref 5–15)
BUN: 17 mg/dL (ref 8–23)
CO2: 24 mmol/L (ref 22–32)
Calcium: 9.1 mg/dL (ref 8.9–10.3)
Chloride: 108 mmol/L (ref 98–111)
Creatinine, Ser: 1.16 mg/dL (ref 0.61–1.24)
GFR, Estimated: >60 mL/min (ref >60)
Glucose, Bld: 146 mg/dL — ABNORMAL HIGH (ref 70–99)
Potassium: 3.4 mmol/L — ABNORMAL LOW (ref 3.5–5.1)
Sodium: 142 mmol/L (ref 135–145)
Total Bilirubin: 0.5 mg/dL (ref 0.3–1.2)
Total Protein: 6.9 g/dL (ref 6.5–8.1)

## 2023-01-08 LAB — TSH: TSH: 16.154 u[IU]/mL — ABNORMAL HIGH (ref 0.350–4.500)

## 2023-01-08 MED ORDER — HEPARIN SOD (PORK) LOCK FLUSH 100 UNIT/ML IV SOLN
500.0000 [IU] | Freq: Once | INTRAVENOUS | Status: AC | PRN
  Administered 2023-01-08: 500 [IU]
  Filled 2023-01-08: qty 5

## 2023-01-08 MED ORDER — SODIUM CHLORIDE 0.9 % IV SOLN
Freq: Once | INTRAVENOUS | Status: AC
  Filled 2023-01-08: qty 250

## 2023-01-08 MED ORDER — SODIUM CHLORIDE 0.9 % IV SOLN
10.0000 mg/kg | Freq: Once | INTRAVENOUS | Status: AC
  Administered 2023-01-08: 1000 mg via INTRAVENOUS
  Filled 2023-01-08: qty 20

## 2023-01-08 NOTE — Assessment & Plan Note (Addendum)
Continue Synthroid 100 mcg daily TSH is improving.

## 2023-01-08 NOTE — Assessment & Plan Note (Signed)
Status post radiation,  02/19/22 MRI showed decreased brain lesion size and edema.  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 Mitzi Hansen.

## 2023-01-08 NOTE — Assessment & Plan Note (Signed)
Recommend patient to take Calcium 1200mg daily.  

## 2023-01-08 NOTE — Assessment & Plan Note (Signed)
Durvalumab treatment as planned.  

## 2023-01-08 NOTE — Progress Notes (Signed)
Hematology/Oncology Progress note [phone] [phone]    CHIEF COMPLAINTS/REASON FOR VISIT:  Follow up for lung cancer   ASSESSMENT & PLAN:   Cancer Staging  Squamous cell carcinoma of right lung (HCC) Staging form: Lung, AJCC 8th Edition - Clinical stage from 09/20/2020: Stage IV (cT1b, cN2, cM1) - Signed by [name], MD on 12/13/2021   Squamous cell carcinoma of right lung (HCC) Durvalumab was previously held due to pneumonitis.  May 2024 CT scan showed Improving bilateral scattered interstitial and ground-glass opacities. Discussed with pulmonology Dr. Jayme Cloud. 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 Proceed with Durvalumab.    Hypothyroidism due to medication Continue Synthroid 100 mcg daily TSH is improving.    Encounter for antineoplastic immunotherapy Durvalumab treatment as planned.   Malignant neoplasm metastatic to brain [facility]) Status post radiation,  02/19/22 MRI showed decreased brain lesion size and edema.  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 Mitzi Hansen.    Hypocalcemia Recommend patient to take Calcium 1200mg  daily.   Orders Placed This Encounter  Procedures   T4    Standing Status:   Future    Standing Expiration Date:   02/05/2024   TSH    Standing Status:   Future    Standing Expiration Date:   02/05/2024   CBC with Differential    Standing Status:   Future    Standing Expiration Date:   02/05/2024   Comprehensive metabolic panel    Standing Status:   Future    Standing Expiration Date:   02/05/2024    Follow up 2 weeks lab MD Durvalumab All questions were  answered. The patient knows to call the clinic with any problems, questions or concerns.  [name], MD, PhD [facility] Hematology Oncology 01/08/2023       HISTORY OF PRESENTING ILLNESS:  Joshua [name] is a 67 y.o. male who has above history reviewed by me today presents for follow up visit for Stage IV lung squamous cell carcinoma. Oncology History  Squamous cell carcinoma of right lung (HCC)  09/18/2020 Imaging   PET scan showed right upper lobe nodule maximal SUV 11.5.  Hypermetabolic cluster right paratracheal adenopathy with maximum SUV up to 12.  No findings of metastatic disease to the neck/abdomen/pelvis or skeleton.   09/20/2020 Initial Diagnosis   Squamous cell carcinoma of right lung (HCC) NGS showed PD-L1 TPS 95%, LRP1B S957fs, PIK3CA E545K, RB1 c1422-2A>T, RIT1 T83_ A84del, STK11 P218fs, TPS53 R248W, TMB 19.60mut/mb, MS stable.    09/20/2020 Cancer Staging   Staging form: Lung, AJCC 8th Edition - Clinical stage from 09/20/2020: Stage IV (cT1b, cN2, cM1) - Signed by [name], MD on 12/13/2021   10/03/2020 Imaging   MRI brain with and without contrast showed no evidence of intracranial metastatic disease. Well-defined round lesion within the left parotid tail measuring approximately 2.3 x 1.8 x 1.8 cm   10/09/2020 - 11/27/2020 Radiation Therapy   concurrent chemoradiation   10/10/2020 - 10/31/2020 Chemotherapy   10/10/2020 concurrent chemoradiation.  carboplatin AUC of 2 and Taxol 45 mg/m2 x 4.  Additional chemotherapy was held due to thrombocytopenia, neutropenia       12/31/2020 - 03/21/2022 Chemotherapy  Maintenance  Durvalumab q14d      12/31/2020 -  Chemotherapy   Patient is on Treatment Plan : LUNG Durvalumab (10) q14d     03/28/2021 Imaging   CT without contrast showed new linear right perihilar consolidation with associated traction bronchiectasis.  Likely postradiation changes.  Additional new peribronchial vascular/nodular groundglass opacities with areas of  consolidation or seen primarily in the right upper and lower lobes, more distant from expected radiation fields. 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  Per my discussion with pulmonology Dr. 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     05/09/2021 Imaging   Repeat CT chest without contrast showed evolving radiation changes in the right perihilar region likely accounting for interval increase to thickening of the posterior wall of the right upper lobe bronchus.  The more peripheral right lung groundglass opacities seen on most recent study have generally improved with exception of one peripherally right upper lobe which appears new.  These are likely inflammatory.  No findings highly suspicious for local recurrence.   05/30/2021 - 01/10/2022 Chemotherapy   Durvalumab q14d     10/07/2021 Imaging   MRI brain with and without contrast showed 2 contrast-enhancing lesions, most consistent with metastatic disease.  Largest lesion is in the left frontal lobe and measures up to 1.3 cm with a large amount of surrounding edema and 9 mm rightward midline shift.  The other enhancing lesion was 4 mm in the right occipital lobe with a small amount of surrounding edema.  No acute or chronic hemorrhage.   10/07/2021 Progression   Headache after colonoscopy, progressive difficulty using her right arm.  Progressively worsening of speech. Stage IV lung cancer with brain metastasis.  started on Dexamethasone.  He was seen by neurosurgery and radiation oncology.  Recommendation is to proceed with SRS.  Patient was discharged with dexamethasone   10/16/2021 Imaging   PET scan showed postradiation changes in the right supra hilar lung without evidence of local recurrence.  No evidence of lung cancer recurrence or metastatic disease.  Single focus of intense metabolic activity associated with  posterior left 11th rib likely secondary to recent fall/posttraumatic metabolic changes.  Frontal  uptake in the left frontal lobe corresponding to the known metastatic lesion in the brain   10/29/2021 -  Radiation Therapy   Brain Radiation Surgery [facility]   12/10/2021 Imaging   Brain MRI: Continued interval decrease in size of a left frontal lobe metastasis, now measuring 10 mm. Moderate surrounding edema has also decreased.  3-4 mm metastasis within the right occipital lobe, not significantly changed in size. There is now a small focus of central non enhancement within this lesion. Increased edema. 3 mm enhancing metastasis within the high left parietal lobe. In retrospect, this was likely present as a subtle punctate focus of enhancement on the prior brain MRI of 10/22/2021.   01/18/2022 Imaging   CT chest abdomen pelvis w contrast 1. Stable post treatment changes in the right upper lobe medially with dense radiation fibrosis but no findings to suggest local recurrent tumor. 2. Significant progression of ill-defined ground-glass opacity, interstitial thickening and airspace nodularity in the right upper lobe and upper aspect of the right lower lobe. Findings could reflect an inflammatory or atypical infectious process, drug induced pneumonitis or interstitial spread of tumor.3. No mediastinal or hilar mass or adenopathy. 4. No findings for abdominal/pelvic metastatic disease or osseous metastatic  disease.   02/19/2022 Imaging   MRI brain w wo contrast showed Three metastatic deposits in the brain appear smaller. Decreased edema in the left parietal lobe. No new lesions    04/04/2022 Imaging   CT chest w contrast 1. Stable radiation changes in the right perihilar region without evidence of local recurrence. 2. Interval improvement in the patchy ground-glass opacities previously demonstrated in both lungs, most consistent with a resolving infectious or inflammatory process (including immunotherapy-related  pneumonitis). 3. No evidence of metastatic disease.   05/14/2022 Miscellaneous   He had a mechanical fall and fractured his left elbow. xrays showed a left elbow fracture  05/26/22 seen by orthopedic surgeon, cast applied.    05/27/2022 Imaging   MRI brain w wo contrast 1. All three known brain metastases have enlarged since August, with increasing regional edema. And there is a small new left anterior cerebellar 4-5 mm metastasis (was questionably punctate on the May MRI). This constellation could reflect a combination of True Progression (Left Cerebellar lesion) and pseudoprogression/radionecrosis (3 existing mets).    2. No significant intracranial mass effect. 3. Underlying moderate cerebral white matter signal changes.   05/30/2022 Imaging   CT chest abdomen pelvis w contrast  1. 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 There is a stable small right pleural effusion, but no definitive findings to suggest residual or recurrent disease on today's examination. 2. No signs of metastatic disease in the abdomen or pelvis.  3. Aortic atherosclerosis, in addition to two-vessel coronary artery disease. Please note that although the presence of coronary artery calcium documents the presence of coronary artery disease, the severity of this disease and any potential stenosis cannot be assessed on this non-gated CT examination. Assessment for potential risk factor modification, dietary therapy or pharmacologic therapy may be warranted, if clinically indicated. 4. Hepatic steatosis. 5. Colonic diverticulosis without evidence of acute diverticulitis at this time. 6. Incompletely imaged but apparently occluded vascular graft in the superior aspect of the right anterior thigh. Referral to vascular specialist for further clinical evaluation should be considered if clinically  appropriate. 7. Additional incidental findings, as above.   07/22/2022 Imaging   MRI brain  1. No new or enlarging lesions. No intracranial disease progression. 2. Decreased size of treated lesions of the left frontoparietal junction and left postcentral gyrus. 3. Unchanged lesions in the left cerebellum and right occipital lobe.   10/15/2022 Imaging   MRI brain w wo contrast showed Interval increase in size of multiple intraparenchymal lesions with worsened surrounding vasogenic edema. 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 No new lesions   11/05/2022 Imaging   MRI brain w wo contrast  1. Likely slight increase and right occipital and left cerebellar lesions. Otherwise, unchanged metastases, as above. No new lesions identified. 2. Partially imaged large probable lymph node in the left upper neck, apparent on prior PET CT.   11/13/2022 -  Radiation Therapy   Each of the sites were treated with SRS/SBRT/SRT-IMRT technique to 20 Gy in 1 fraction to each of the following sites: PTV_2_LCerebel_61mm PTV_3_RCerebel_49mm PTV_4_ROccip_72mm PTV_5_LParietal_64mm   11/27/2022 Imaging   CT chest abdomen pelvis w contrast showed  Improving bilateral scattered interstitial and ground-glass opacities. Small amount of residual areas in the extreme left lower lobe inferiorly. This specific area is slightly increased from previous but overall the amount of opacities is significantly improved. Recommend continued  follow-up. No developing new mass lesion, fluid collection or lymph node enlargement. Stable posttreatment changes right perihilar in the lung. Fatty liver infiltration. Colonic diverticulosis. Improved tiny right pleural effusion    Today he feels well.Intermittent headache, is better. denies any focal deficits. chronic SOB,not worse.  occasional cough. No new complaints.    Review of Systems  Constitutional:   Positive for fatigue. Negative for appetite change, chills, fever and unexpected weight change.  HENT:   Negative for hearing loss and voice change.   Eyes:  Negative for eye problems and icterus.  Respiratory:  Negative for chest tightness, cough and shortness of breath.   Cardiovascular:  Negative for chest pain and leg swelling.  Gastrointestinal:  Negative for abdominal distention and abdominal pain.  Endocrine: Negative for hot flashes.  Genitourinary:  Negative for difficulty urinating, dysuria and frequency.   Musculoskeletal:  Negative for arthralgias.  Skin:  Negative for itching and rash.  Neurological:  Positive for extremity weakness. Negative for headaches, light-headedness, numbness and speech difficulty.  Hematological:  Negative for adenopathy. Does not bruise/bleed easily.  Psychiatric/Behavioral:  Negative for confusion.       MEDICAL HISTORY:  Past Medical History:  Diagnosis Date   Allergy    Cancer (HCC)    lung   COPD (chronic obstructive pulmonary disease) (HCC)    Dyspnea    former smoker. only doe   History of kidney stones    Hypertension    Peripheral vascular disease (HCC)    Pre-diabetes    Sleep apnea     SURGICAL HISTORY: Past Surgical History:  Procedure Laterality Date   ANTERIOR CERVICAL DECOMP/DISCECTOMY FUSION N/A 03/14/2020   Procedure: ANTERIOR CERVICAL DECOMPRESSION/DISCECTOMY FUSION 3 LEVELS C4-7;  Surgeon: Venetia Night, MD;  Location: [facility] ORS;  Service: Neurosurgery;  Laterality: N/A;   BACK SURGERY  2011   CENTRAL LINE INSERTION Right 09/10/2016   Procedure: CENTRAL LINE INSERTION;  Surgeon: Renford Dills, MD;  Location: [facility] ORS;  Service: Vascular;  Laterality: Right;   CERVICAL FUSION     C4-c7 on Mar 14, 2020   COLONOSCOPY  2013 ?   COLONOSCOPY N/A 09/09/2021   Procedure: COLONOSCOPY;  Surgeon: Jaynie Collins, DO;  Location: [facility] ENDOSCOPY;  Service: Gastroenterology;  Laterality: N/A;   CORONARY ANGIOPLASTY      ESOPHAGOGASTRODUODENOSCOPY N/A 09/09/2021   Procedure: ESOPHAGOGASTRODUODENOSCOPY (EGD);  Surgeon: Jaynie Collins, DO;  Location: [facility] ENDOSCOPY;  Service: Gastroenterology;  Laterality: N/A;   FEMORAL-POPLITEAL BYPASS GRAFT Right 09/10/2016   Procedure: BYPASS GRAFT FEMORAL-POPLITEAL ARTERY ( BELOW KNEE );  Surgeon: Renford Dills, MD;  Location: [facility] ORS;  Service: Vascular;  Laterality: Right;   FEMORAL-TIBIAL BYPASS GRAFT Right 03/23/2020   Procedure: BYPASS GRAFT RIGHT FEMORAL- DISTAL TIBIAL ARTERY WITH DISTAL FLOW GRAFT;  Surgeon: Renford Dills, MD;  Location: [facility] ORS;  Service: Vascular;  Laterality: Right;   LEFT HEART CATH AND CORONARY ANGIOGRAPHY Left 05/16/2021   Procedure: LEFT HEART CATH AND CORONARY ANGIOGRAPHY;  Surgeon: Armando Reichert, MD;  Location: [facility] INVASIVE CV LAB;  Service: Cardiovascular;  Laterality: Left;   LOWER EXTREMITY ANGIOGRAPHY Right 11/18/2016   Procedure: Lower Extremity Angiography;  Surgeon: Renford Dills, MD;  Location: [facility] INVASIVE CV LAB;  Service: Cardiovascular;  Laterality: Right;   LOWER EXTREMITY ANGIOGRAPHY Right 12/09/2016   Procedure: Lower Extremity Angiography;  Surgeon: Renford Dills, MD;  Location: [facility] INVASIVE CV LAB;  Service: Cardiovascular;  Laterality: Right;   LOWER EXTREMITY ANGIOGRAPHY Right 11/15/2019  Procedure: LOWER EXTREMITY ANGIOGRAPHY;  Surgeon: Renford Dills, MD;  Location: [facility] INVASIVE CV LAB;  Service: Cardiovascular;  Laterality: Right;   LOWER EXTREMITY ANGIOGRAPHY Right 12/01/2019   Procedure: Lower Extremity Angiography;  Surgeon: Annice Needy, MD;  Location: [facility] INVASIVE CV LAB;  Service: Cardiovascular;  Laterality: Right;   LOWER EXTREMITY ANGIOGRAPHY Right 12/02/2019   Procedure: LOWER EXTREMITY ANGIOGRAPHY;  Surgeon: Renford Dills, MD;  Location: [facility] INVASIVE CV LAB;  Service: Cardiovascular;  Laterality: Right;   LOWER EXTREMITY ANGIOGRAPHY Right 03/23/2020   Procedure: Lower  Extremity Angiography;  Surgeon: Renford Dills, MD;  Location: [facility] INVASIVE CV LAB;  Service: Cardiovascular;  Laterality: Right;   LOWER EXTREMITY ANGIOGRAPHY Right 09/25/2021   Procedure: Lower Extremity Angiography;  Surgeon: Renford Dills, MD;  Location: [facility] INVASIVE CV LAB;  Service: Cardiovascular;  Laterality: Right;   LOWER EXTREMITY ANGIOGRAPHY Right 09/26/2021   Procedure: Lower Extremity Angiography;  Surgeon: Annice Needy, MD;  Location: [facility] INVASIVE CV LAB;  Service: Cardiovascular;  Laterality: Right;   LOWER EXTREMITY ANGIOGRAPHY Right 04/08/2022   Procedure: Lower Extremity Angiography;  Surgeon: Renford Dills, MD;  Location: [facility] INVASIVE CV LAB;  Service: Cardiovascular;  Laterality: Right;   LOWER EXTREMITY INTERVENTION  11/18/2016   Procedure: Lower Extremity Intervention;  Surgeon: Renford Dills, MD;  Location: [facility] INVASIVE CV LAB;  Service: Cardiovascular;;   PERIPHERAL VASCULAR CATHETERIZATION Right 01/02/2015   Procedure: Lower Extremity Angiography;  Surgeon: Renford Dills, MD;  Location: [facility] INVAS[facility] CV LAB;  Service: Cardiovascular;  Laterality: Right;   PERIPHERAL VASCULAR CATHETERIZATION Right 06/18/2015   Procedure: Lower Extremity Angiography;  Surgeon: Annice Needy, MD;  Location: [facility] INV[facility] CV LAB;  Service: Cardiovascular;  Laterality: Right;   PERIPHERAL VASCULAR CATHETERIZATION  06/18/2015   Procedure: Lower Extremity Intervention;  Surgeon: Annice Needy, MD;  Location: [facility] INVASIVE CV LAB;  Service: Cardiovascular;;   PERIPHERAL VASCULAR CATHETERIZATION N/A 10/09/2015   Procedure: Abdominal Aortogram w/Lower Extremity;  Surgeon: Renford Dills, MD;  Location: [facility] INV[facility] LAB;  Service: Cardiovascular;  Laterality: N/A;   PERIPHERAL VASCULAR CATHETERIZATION  10/09/2015   Procedure: Lower Extremity Intervention;  Surgeon: Renford Dills, MD;  Location: [facility] IN[facility] LAB;  Service: Cardiovascular;;   PERIPHERAL VASCULAR  CATHETERIZATION Right 10/10/2015   Procedure: Lower Extremity Angiography;  Surgeon: Annice Needy, MD;  Location: [facility];  Service: Cardiovascular;  Laterality: Right;   PERIPHERAL VASCULAR CATHETERIZATION Right 10/10/2015   Procedure: Lower Extremity Intervention;  Surgeon: Annice Needy, MD;  Location: [facility];  Service: Cardiovascular;  Laterality: Right;   PERIPHERAL VASCULAR CATHETERIZATION Right 12/03/2015   Procedure: Lower Extremity Angiography;  Surgeon: Annice Needy, MD;  Location: [facility];  Service: Cardiovascular;  Laterality: Right;   PERIPHERAL VASCULAR CATHETERIZATION Right 12/04/2015   Procedure: Lower Extremity Angiography;  Surgeon: Annice Needy, MD;  Location: [facility];  Service: Cardiovascular;  Laterality: Right;   PERIPHERAL VASCULAR CATHETERIZATION  12/04/2015   Procedure: Lower Extremity Intervention;  Surgeon: Annice Needy, MD;  Location: [facility];  Service: Cardiovascular;;   PERIPHERAL VASCULAR CATHETERIZATION Right 06/30/2016   Procedure: Lower Extremity Angiography;  Surgeon: Annice Needy, MD;  Location: [facility];  Service: Cardiovascular;  Laterality: Right;   PERIPHERAL VASCULAR CATHETERIZATION Right 06/25/2016   Procedure: Lower Extremity Angiography;  Surgeon: Renford Dills, MD;  Location: [facility];  Service: Cardiovascular;  Laterality: Right;   PERIPHERAL VASCULAR CATHETERIZATION Right 07/01/2016   Procedure: Lower Extremity Angiography;  Surgeon: Renford Dills, MD;  Location: [facility];  Service: Cardiovascular;  Laterality: Right;   PERIPHERAL VASCULAR CATHETERIZATION  07/01/2016   Procedure: Lower Extremity Intervention;  Surgeon: Renford Dills, MD;  Location: [facility];  Service: Cardiovascular;;   PERIPHERAL VASCULAR CATHETERIZATION Right 08/01/2016   Procedure: Lower Extremity Angiography;  Surgeon: Renford Dills, MD;  Location: [facility] INV[facility] CV  LAB;  Service: Cardiovascular;  Laterality: Right;   PORTA CATH INSERTION N/A 10/05/2020   Procedure: PORTA CATH INSERTION;  Surgeon: Renford Dills, MD;  Location: [facility] INVASIVE CV LAB;  Service: Cardiovascular;  Laterality: N/A;   stent placement in right leg Right    VIDEO BRONCHOSCOPY N/A 09/14/2020   Procedure: VIDEO BRONCHOSCOPY WITH FLUORO;  Surgeon: Salena Saner, MD;  Location: [facility] ORS;  Service: Cardiopulmonary;  Laterality: N/A;   VIDEO BRONCHOSCOPY WITH ENDOBRONCHIAL ULTRASOUND N/A 09/14/2020   Procedure: VIDEO BRONCHOSCOPY WITH ENDOBRONCHIAL ULTRASOUND;  Surgeon: Salena Saner, MD;  Location: [facility] ORS;  Service: Cardiopulmonary;  Laterality: N/A;    SOCIAL HISTORY: Social History   Socioeconomic History   Marital status: Single    Spouse name: Not on file   Number of children: Not on file   Years of education: Not on file   Highest education level: Not on file  Occupational History   Occupation: unemployed  Tobacco Use   Smoking status: Former    Packs/day: 2.00    Years: 38.00    Additional pack years: 0.00    Total pack years: 76.00    Types: Cigarettes    Quit date: 12/20/2014    Years since quitting: 8.0   Smokeless tobacco: Never   Tobacco comments:    quit smoking in 2017 most smoked 2   Vaping Use   Vaping Use: Former  Substance and Sexual Activity   Alcohol use: Not Currently   Drug use: No   Sexual activity: Yes  Other Topics Concern   Not on file  Social History Narrative   Not on file   Social Determinants of Health   Financial Resource Strain: Not on file  Food Insecurity: Not on file  Transportation Needs: Not on file  Physical Activity: Not on file  Stress: Not on file  Social Connections: Not on file  Intimate Partner Violence: Not on file    FAMILY HISTORY: Family History  Problem Relation Age of Onset   Diabetes Mother    Hypertension Mother    Heart murmur Mother    Leukemia Mother    Throat cancer Maternal  Grandmother    Colon cancer Neg Hx    Breast cancer Neg Hx     ALLERGIES:  has No Known Allergies.  MEDICATIONS:  Current Outpatient Medications  Medication Sig Dispense Refill   aspirin EC 81 MG tablet Take 81 mg by mouth daily. Swallow whole.     clopidogrel (PLAVIX) 75 MG tablet TAKE 1 TABLET BY MOUTH DAILY 100 tablet 2   levothyroxine (SYNTHROID) 100 MCG tablet TAKE 1 TABLET BY MOUTH ONCE DAILY BEFORE BREAKFAST 90 tablet 0   simvastatin (ZOCOR) 40 MG tablet Take 40 mg by mouth at bedtime.     No current facility-administered medications for this visit.   Facility-Administered Medications Ordered in Other Visits  Medication Dose Route Frequency Provider Last Rate Last Admin   albuterol (PROVENTIL) (2.5 MG/3ML) 0.083% nebulizer solution 2.5 mg  2.5 mg Nebulization Once Salena Saner, MD         PHYSICAL EXAMINATION: ECOG PERFORMANCE STATUS: 1 - Symptomatic but completely ambulatory Vitals:   01/08/23 0832  BP: (!) 145/80  Pulse: 88  Resp: 18  Temp: (!) 97.1 F (36.2 C)  SpO2: 98%   Filed Weights   01/08/23 0832  Weight: 222 lb 12.8 oz (101.1 kg)    Physical Exam Constitutional:      General: He is not in acute distress.    Appearance: He is obese. He is not diaphoretic.     Comments: Patient walks with a cane  HENT:     Head: Normocephalic and atraumatic.  Eyes:     General: No scleral icterus. Cardiovascular:     Rate and Rhythm: Normal rate and regular rhythm.     Heart sounds: No murmur heard. Pulmonary:     Effort: Pulmonary effort is normal. No respiratory distress.  Abdominal:     General: There is no distension.     Palpations: Abdomen is soft.     Tenderness: There is no abdominal tenderness.  Musculoskeletal:     Cervical back: Normal range of motion and neck supple.  Skin:    General: Skin is warm and dry.     Findings: No erythema.  Neurological:     Mental Status: He is alert and oriented to person, place, and time. Mental status is at  baseline.     Cranial Nerves: No cranial nerve deficit.     Motor: No abnormal muscle tone.  Psychiatric:        Mood and Affect: Mood and affect normal.      LABORATORY DATA:  I have reviewed the data as listed    Latest Ref Rng & Units 01/08/2023    8:01 AM 12/25/2022    8:23 AM 12/11/2022    8:50 AM  CBC  WBC 4.0 - 10.5 K/uL 5.6  5.1  5.1   Hemoglobin 13.0 - 17.0 g/dL 16.1  09.6  04.5   Hematocrit 39.0 - 52.0 % 38.7  41.4  40.1   Platelets 150 - 400 K/uL 221  252  204       Latest Ref Rng & Units 01/08/2023    8:01 AM 12/25/2022    8:23 AM 12/11/2022    8:50 AM  CMP  Glucose 70 - 99 mg/dL 409  811  914   BUN 8 - 23 mg/dL 17  12  15    Creatinine 0.61 - 1.24 mg/dL 7.82  9.56  2.13   Sodium 135 - 145 mmol/L 142  140  142   Potassium 3.5 - 5.1 mmol/L 3.4  3.7  3.5   Chloride 98 - 111 mmol/L 108  106  107   CO2 22 - 32 mmol/L 24  24  24    Calcium 8.9 - 10.3 mg/dL 9.1  9.0  8.8   Total Protein 6.5 - 8.1 g/dL 6.9  6.9  6.7   Total Bilirubin 0.3 - 1.2 mg/dL 0.5  0.3  0.4   Alkaline Phos 38 - 126 U/L 62  71  76   AST 15 - 41 U/L 25  23  25    ALT 0 - 44 U/L 20  18  21       RADIOGRAPHIC STUDIES: I have personally reviewed the radiological images as listed and agreed with the findings in the report. CT CHEST ABDOMEN PELVIS W CONTRAST  Result Date: 11/27/2022 CLINICAL DATA:  Right-sided  lung cancer, squamous cell carcinoma. Staging. * Tracking Code: BO * EXAM: CT CHEST, ABDOMEN, AND PELVIS WITH CONTRAST TECHNIQUE: Multidetector CT imaging of the chest, abdomen and pelvis was performed following the standard protocol during bolus administration of intravenous contrast. RADIATION DOSE REDUCTION: This exam was performed according to the departmental dose-optimization program which includes automated exposure control, adjustment of the mA and/or kV according to patient size and/or use of iterative reconstruction technique. CONTRAST:  OMNIPAQUE IOHEXOL 300 MG/ML  SOLN COMPARISON:  CT  08/25/2022 and older FINDINGS: CT CHEST FINDINGS Cardiovascular: Right upper chest port. Heart is nonenlarged. No pericardial effusion. Slight pericardial thickening. The thoracic aorta has a normal course and caliber. Mediastinum/Nodes: Slightly patulous thoracic esophagus. Small thyroid gland. No specific abnormal lymph node enlargement seen in the axillary regions, hilum or mediastinum. Lungs/Pleura: Left lung is without consolidation, pneumothorax or effusion. Previously there was some patchy ground-glass in the left lung with some reticular changes which has significantly improved. There is some subtle changes remaining in the left lower lobe dependently. The specific areas are increased from previous but overall the amount in the left lung is markedly improved. The right lung demonstrates some volume loss with thickening and bronchiectasis with some distortion right perihilar involving the right upper lobe which could be posttreatment changes. There are some changes as well there are similar and stable along the superior segment of the right lower lobe. No right-sided new consolidation, pneumothorax or effusion. No new dominant nodule. The previous reticulonodular changes in the right lung on the prior have also substantially improved. Musculoskeletal: Fixation hardware seen along the lower cervical spine at the edge of the imaging field. Scattered endplate osteophytes are seen throughout the thoracic spine with multilevel stenosis. Old left-sided lateral rib fractures inferiorly. CT ABDOMEN PELVIS FINDINGS Hepatobiliary: Mild fatty liver infiltration. The gallbladder is nondilated. Patent portal vein. No space-occupying liver lesion. Pancreas: Unremarkable. No pancreatic ductal dilatation or surrounding inflammatory changes. Spleen: Normal in size without focal abnormality. Adrenals/Urinary Tract: The adrenal glands are preserved. Mild bilateral renal atrophy. No enhancing renal mass or collecting system  dilatation. The ureters have normal course and caliber down to the bladder. Preserved contours of the urinary bladder. Stomach/Bowel: On this non oral contrast exam, large bowel has a normal course and caliber. Scattered stool. Left-sided colonic diverticulosis. Normal appendix in the right lower quadrant. There is some debris in the stomach. Small bowel is nondilated. Vascular/Lymphatic: Aortic atherosclerosis. No enlarged abdominal or pelvic lymph nodes. There are areas of stenosis suggested along the internal iliac vessels. At the edge of the imaging field are evidence of bypass grafts along the right thigh which appears occluded. SFA appears occluded. Please correlate with clinical history. Stable prominent right inguinal node on series 2, image 142. No retroperitoneal or other abdominal nodal enlargement. Reproductive: Prostate is unremarkable. Other: No abdominal wall hernia or abnormality. No abdominopelvic ascites. Musculoskeletal: Degenerative changes seen of the spine and pelvis. There is streak artifact related to the fixation hardware at the lumbosacral spine. IMPRESSION: Improving bilateral scattered interstitial and ground-glass opacities. Small amount of residual areas in the extreme left lower lobe inferiorly. This specific area is slightly increased from previous but overall the amount of opacities is significantly improved. Recommend continued follow-up. No developing new mass lesion, fluid collection or lymph node enlargement. Stable posttreatment changes right perihilar in the lung. Fatty liver infiltration. Colonic diverticulosis. Improved tiny right pleural effusion Electronically Signed   By: Karen Kays M.D.   On: 11/27/2022 16:15  MR Brain W Wo Contrast  Result Date: 11/06/2022 CLINICAL DATA:  Brain metastases, assess treatment response 3T SRS Protocol for radiation treatment planning. EXAM: MRI HEAD WITHOUT AND WITH CONTRAST TECHNIQUE: Multiplanar, multiecho pulse sequences of the  brain and surrounding structures were obtained without and with intravenous contrast. CONTRAST:  10 mL of Vueway COMPARISON:  MRI head October 14, 2022. FINDINGS: Brain: Similar size of proximally 21 mm peripherally enhancing lesion in the left frontal lobe (series 13, image 113) and 14 mm peripherally enhancing lesion in the left parietal lobe (series 13, image 144). Likely slight increase of 17 mm lesion the right occipital lobe (series 13, image 49). Likely slight increase in 7 mm enhancing lesion left cerebellum. Similar small right cerebellar lesion (image 44). No evidence of acute infarct, acute hemorrhage, midline shift or hydrocephalus. Vascular: Major arterial flow voids are maintained at the skull base. Skull and upper cervical spine: Normal marrow signal. Sinuses/Orbits: Largely clear sinuses.  No acute orbital findings. Other: Partially imaged probable large lymph node in the left upper neck. IMPRESSION: 1. Likely slight increase and right occipital and left cerebellar lesions. Otherwise, unchanged metastases, as above. No new lesions identified. 2. Partially imaged large probable lymph node in the left upper neck, apparent on prior PET CT. Electronically Signed   By: Feliberto Harts M.D.   On: 11/06/2022 10:24   MR BRAIN W WO CONTRAST  Result Date: 10/15/2022 CLINICAL DATA:  CNS neoplasm. SRS treatment follow-up. Weakness and difficulty walking. EXAM: MRI HEAD WITHOUT AND WITH CONTRAST TECHNIQUE: Multiplanar, multiecho pulse sequences of the brain and surrounding structures were obtained without and with intravenous contrast. CONTRAST:  9mL GADAVIST GADOBUTROL 1 MMOL/ML IV SOLN COMPARISON:  07/18/2022 FINDINGS: Brain: No acute infarct or hemorrhage. Small amount of chronic blood associated with lesion of the left frontal operculum. There are multiple intraparenchymal lesions: 1. Left cerebellum, 5 mm, previously 2 mm (series 1022, image 144) 2. Right cerebellum, 3 mm, unchanged, image 131 3. Right  occipital lobe, 17 mm, previously 8 mm, image 125 4. Left frontal operculum d, 23 mm, previously 20 mm, image 73 5. Left postcentral gyrus 13 mm, previously 7 mm, image 36 All of these lesions demonstrate a ring enhancement type pattern. Surrounding vasogenic edema has worsened at all lesion sites Vascular: Normal flow voids. Skull and upper cervical spine: Normal marrow signal. Sinuses/Orbits: Negative. Other: None. IMPRESSION: Interval increase in size of multiple intraparenchymal lesions with worsened surrounding vasogenic edema. 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 No new lesions. Electronically Signed   By: Deatra Robinson M.D.   On: 10/15/2022 22:11

## 2023-01-08 NOTE — Patient Instructions (Signed)
[facility]  Discharge Instructions: Thank you for choosing [facility] to provide your oncology and hematology care.  If you have a lab appointment with the Cancer Center, please go directly to the Cancer Center and check in at the registration area.  Wear comfortable clothing and clothing appropriate for easy access to any Portacath or PICC line.   We strive to give you quality time with your provider. You may need to reschedule your appointment if you arrive late (15 or more minutes).  Arriving late affects you and other patients whose appointments are after yours.  Also, if you miss three or more appointments without notifying the office, you may be dismissed from the clinic at the provider's discretion.      For prescription refill requests, have your pharmacy contact our office and allow 72 hours for refills to be completed.    Today you received the following chemotherapy and/or immunotherapy agents Imfinzi        To help prevent nausea and vomiting after your treatment, we encourage you to take your nausea medication as directed.  BELOW ARE SYMPTOMS THAT SHOULD BE REPORTED IMMEDIATELY: *FEVER GREATER THAN 100.4 F (38 C) OR HIGHER *CHILLS OR SWEATING *NAUSEA AND VOMITING THAT IS NOT CONTROLLED WITH YOUR NAUSEA MEDICATION *UNUSUAL SHORTNESS OF BREATH *UNUSUAL BRUISING OR BLEEDING *URINARY PROBLEMS (pain or burning when urinating, or frequent urination) *BOWEL PROBLEMS (unusual diarrhea, constipation, pain near the anus) TENDERNESS IN MOUTH AND THROAT WITH OR WITHOUT PRESENCE OF ULCERS (sore throat, sores in mouth, or a toothache) UNUSUAL RASH, SWELLING OR PAIN  UNUSUAL VAGINAL DISCHARGE OR ITCHING   Items with * indicate a potential emergency and should be followed up as soon as possible or go to the Emergency Department if any problems should occur.  Please show the CHEMOTHERAPY ALERT CARD or IMMUNOTHERAPY ALERT CARD at check-in to  the Emergency Department and triage nurse.  Should you have questions after your visit or need to cancel or reschedule your appointment, please contact [facility]  [phone] and follow the prompts.  Office hours are 8:00 a.m. to 4:30 p.m. Monday - Friday. Please note that voicemails left after 4:00 p.m. may not be returned until the following business day.  We are closed weekends and major holidays. You have access to a nurse at all times for urgent questions. Please call the main number to the clinic [phone] and follow the prompts.  For any non-urgent questions, you may also contact your provider using MyChart. We now offer e-Visits for anyone 18 and older to request care online for non-urgent symptoms. For details visit [email or web address].   Also download the MyChart app! Go to the app store, search "MyChart", open the app, select [facility], and log in with your MyChart username and password.    

## 2023-01-08 NOTE — Assessment & Plan Note (Signed)
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  May 2024 CT scan showed Improving bilateral scattered interstitial and ground-glass opacities. Discussed with pulmonology Dr. Gonzalez. 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 Proceed with Durvalumab.   

## 2023-01-10 ENCOUNTER — Encounter (INDEPENDENT_AMBULATORY_CARE_PROVIDER_SITE_OTHER): Payer: Self-pay | Admitting: Vascular Surgery

## 2023-01-10 LAB — T4: T4, Total: 9.6 ug/dL (ref 4.5–12.0)

## 2023-01-17 ENCOUNTER — Other Ambulatory Visit: Payer: Self-pay | Admitting: Oncology

## 2023-01-17 MED ORDER — LEVOTHYROXINE SODIUM 100 MCG PO TABS: 100.0000 ug | ORAL_TABLET | Freq: Every day | ORAL | 0 refills | Status: DC

## 2023-01-23 ENCOUNTER — Inpatient Hospital Stay: Payer: Medicare Other | Attending: Oncology

## 2023-01-23 ENCOUNTER — Inpatient Hospital Stay: Payer: Medicare Other

## 2023-01-23 ENCOUNTER — Inpatient Hospital Stay (HOSPITAL_BASED_OUTPATIENT_CLINIC_OR_DEPARTMENT_OTHER): Payer: Medicare Other | Admitting: Oncology

## 2023-01-23 ENCOUNTER — Encounter: Payer: Self-pay | Admitting: Oncology

## 2023-01-23 VITALS — BP 141/74 | HR 88 | Temp 97.8°F | Resp 16 | Ht 71.0 in | Wt 222.2 lb

## 2023-01-23 DIAGNOSIS — E032 Hypothyroidism due to medicaments and other exogenous substances: Secondary | ICD-10-CM

## 2023-01-23 DIAGNOSIS — C3491 Malignant neoplasm of unspecified part of right bronchus or lung: Secondary | ICD-10-CM

## 2023-01-23 DIAGNOSIS — J90 Pleural effusion, not elsewhere classified: Secondary | ICD-10-CM | POA: Insufficient documentation

## 2023-01-23 DIAGNOSIS — C7931 Secondary malignant neoplasm of brain: Secondary | ICD-10-CM

## 2023-01-23 DIAGNOSIS — Z5112 Encounter for antineoplastic immunotherapy: Secondary | ICD-10-CM | POA: Diagnosis present

## 2023-01-23 DIAGNOSIS — C3411 Malignant neoplasm of upper lobe, right bronchus or lung: Secondary | ICD-10-CM | POA: Diagnosis present

## 2023-01-23 DIAGNOSIS — E876 Hypokalemia: Secondary | ICD-10-CM | POA: Insufficient documentation

## 2023-01-23 LAB — COMPREHENSIVE METABOLIC PANEL
ALT: 27 U/L (ref 0–44)
AST: 32 U/L (ref 15–41)
Albumin: 3.8 g/dL (ref 3.5–5.0)
Alkaline Phosphatase: 64 U/L (ref 38–126)
Anion gap: 10 (ref 5–15)
BUN: 11 mg/dL (ref 8–23)
CO2: 24 mmol/L (ref 22–32)
Calcium: 9 mg/dL (ref 8.9–10.3)
Chloride: 106 mmol/L (ref 98–111)
Creatinine, Ser: 1.04 mg/dL (ref 0.61–1.24)
GFR, Estimated: >60 mL/min (ref >60)
Glucose, Bld: 149 mg/dL — ABNORMAL HIGH (ref 70–99)
Potassium: 3.8 mmol/L (ref 3.5–5.1)
Sodium: 140 mmol/L (ref 135–145)
Total Bilirubin: 0.2 mg/dL — ABNORMAL LOW (ref 0.3–1.2)
Total Protein: 6.4 g/dL — ABNORMAL LOW (ref 6.5–8.1)

## 2023-01-23 LAB — CBC WITH DIFFERENTIAL/PLATELET
Abs Immature Granulocytes: 0.01 10*3/uL (ref 0.00–0.07)
Basophils Absolute: 0.1 10*3/uL (ref 0.0–0.1)
Basophils Relative: 2 %
Eosinophils Absolute: 0.3 10*3/uL (ref 0.0–0.5)
Eosinophils Relative: 6 %
HCT: 40.3 % (ref 39.0–52.0)
Hemoglobin: 13 g/dL (ref 13.0–17.0)
Immature Granulocytes: 0 %
Lymphocytes Relative: 18 %
Lymphs Abs: 1 10*3/uL (ref 0.7–4.0)
MCH: 26.9 pg (ref 26.0–34.0)
MCHC: 32.3 g/dL (ref 30.0–36.0)
MCV: 83.3 fL (ref 80.0–100.0)
Monocytes Absolute: 0.7 10*3/uL (ref 0.1–1.0)
Monocytes Relative: 13 %
Neutro Abs: 3.4 10*3/uL (ref 1.7–7.7)
Neutrophils Relative %: 61 %
Platelets: 232 10*3/uL (ref 150–400)
RBC: 4.84 MIL/uL (ref 4.22–5.81)
RDW: 15.1 % (ref 11.5–15.5)
WBC: 5.5 10*3/uL (ref 4.0–10.5)
nRBC: 0 % (ref 0.0–0.2)

## 2023-01-23 LAB — TSH: TSH: 22.216 u[IU]/mL — ABNORMAL HIGH (ref 0.350–4.500)

## 2023-01-23 MED ORDER — HEPARIN SOD (PORK) LOCK FLUSH 100 UNIT/ML IV SOLN
500.0000 [IU] | Freq: Once | INTRAVENOUS | Status: AC | PRN
  Administered 2023-01-23: 500 [IU]
  Filled 2023-01-23: qty 5

## 2023-01-23 MED ORDER — SODIUM CHLORIDE 0.9 % IV SOLN
Freq: Once | INTRAVENOUS | Status: AC
  Filled 2023-01-23: qty 250

## 2023-01-23 MED ORDER — SODIUM CHLORIDE 0.9 % IV SOLN
10.0000 mg/kg | Freq: Once | INTRAVENOUS | Status: AC
  Administered 2023-01-23: 1000 mg via INTRAVENOUS
  Filled 2023-01-23: qty 20

## 2023-01-23 NOTE — Patient Instructions (Signed)
[facility] [place] [facility]  Discharge Instructions: Thank you for choosing [facility] to provide your oncology and hematology care.  If you have a lab appointment with the Cancer Center, please go directly to the Cancer Center and check in at the registration area.  Wear comfortable clothing and clothing appropriate for easy access to any Portacath or PICC line.   We strive to give you quality time with your provider. You may need to reschedule your appointment if you arrive late (15 or more minutes).  Arriving late affects you and other patients whose appointments are after yours.  Also, if you miss three or more appointments without notifying the office, you may be dismissed from the clinic at the provider's discretion.      For prescription refill requests, have your pharmacy contact our office and allow 72 hours for refills to be completed.    Today you received the following chemotherapy and/or immunotherapy agents Imfinzi        To help prevent nausea and vomiting after your treatment, we encourage you to take your nausea medication as directed.  BELOW ARE SYMPTOMS THAT SHOULD BE REPORTED IMMEDIATELY: *FEVER GREATER THAN 100.4 F (38 C) OR HIGHER *CHILLS OR SWEATING *NAUSEA AND VOMITING THAT IS NOT CONTROLLED WITH YOUR NAUSEA MEDICATION *UNUSUAL SHORTNESS OF BREATH *UNUSUAL BRUISING OR BLEEDING *URINARY PROBLEMS (pain or burning when urinating, or frequent urination) *BOWEL PROBLEMS (unusual diarrhea, constipation, pain near the anus) TENDERNESS IN MOUTH AND THROAT WITH OR WITHOUT PRESENCE OF ULCERS (sore throat, sores in mouth, or a toothache) UNUSUAL RASH, SWELLING OR PAIN  UNUSUAL VAGINAL DISCHARGE OR ITCHING   Items with * indicate a potential emergency and should be followed up as soon as possible or go to the Emergency Department if any problems should occur.  Please show the CHEMOTHERAPY ALERT CARD or IMMUNOTHERAPY ALERT CARD at check-in to  the Emergency Department and triage nurse.  Should you have questions after your visit or need to cancel or reschedule your appointment, please contact [facility] [place] [facility]  [phone] and follow the prompts.  Office hours are 8:00 a.m. to 4:30 p.m. Monday - Friday. Please note that voicemails left after 4:00 p.m. may not be returned until the following business day.  We are closed weekends and major holidays. You have access to a nurse at all times for urgent questions. Please call the main number to the clinic [phone] and follow the prompts.  For any non-urgent questions, you may also contact your provider using MyChart. We now offer e-Visits for anyone 18 and older to request care online for non-urgent symptoms. For details visit [email or web address].   Also download the MyChart app! Go to the app store, search "MyChart", open the app, select [facility], and log in with your MyChart username and password.    

## 2023-01-23 NOTE — Assessment & Plan Note (Signed)
Status post radiation,  02/19/22 MRI showed decreased brain lesion size and edema.  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 Mitzi Hansen.  MRI on 02/05/23

## 2023-01-23 NOTE — Assessment & Plan Note (Addendum)
Continue Synthroid 100 mcg daily TSH is pending

## 2023-01-23 NOTE — Assessment & Plan Note (Addendum)
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  May 2024 CT scan showed Improving bilateral scattered interstitial and ground-glass opacities. Discussed with pulmonology Dr. Gonzalez. 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 Proceed with Durvalumab.   

## 2023-01-23 NOTE — Progress Notes (Signed)
Hematology/Oncology Progress note [phone] [phone]    CHIEF COMPLAINTS/REASON FOR VISIT:  Follow up for lung cancer   ASSESSMENT & PLAN:   Cancer Staging  Squamous cell carcinoma of right lung (HCC) Staging form: Lung, AJCC 8th Edition - Clinical stage from 09/20/2020: Stage IV (cT1b, cN2, cM1) - Signed by [name], MD on 12/13/2021   Squamous cell carcinoma of right lung (HCC) Durvalumab was previously held due to pneumonitis.  May 2024 CT scan showed Improving bilateral scattered interstitial and ground-glass opacities. Discussed with pulmonology Dr. Jayme Cloud. 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 Proceed with Durvalumab.   Hypothyroidism due to medication Continue Synthroid 100 mcg daily TSH is pending   Malignant neoplasm metastatic to brain [facility]) Status post radiation,  02/19/22 MRI showed decreased brain lesion size and edema.  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 Mitzi Hansen.  MRI on 02/05/23   No orders of the defined types were placed in this encounter.   Follow up 2 weeks lab MD Durvalumab All questions were answered. The patient knows to call the clinic with any problems, questions or concerns.  [name], MD, PhD [facility] Hematology Oncology 01/23/2023       HISTORY OF PRESENTING ILLNESS:  Joshua [name] is a 67 y.o. male who has above history reviewed by me today presents for follow up visit for Stage IV lung squamous cell carcinoma. Oncology History  Squamous cell carcinoma of right lung (HCC)  09/18/2020 Imaging   PET scan showed right upper lobe nodule  maximal SUV 11.5.  Hypermetabolic cluster right paratracheal adenopathy with maximum SUV up to 12.  No findings of metastatic disease to the neck/abdomen/pelvis or skeleton.   09/20/2020 Initial Diagnosis   Squamous cell carcinoma of right lung (HCC) NGS showed PD-L1 TPS 95%, LRP1B S934fs, PIK3CA E545K, RB1 c1422-2A>T, RIT1 T83_ A84del, STK11 P25fs, TPS53 R248W, TMB 19.78mut/mb, MS stable.    09/20/2020 Cancer Staging   Staging form: Lung, AJCC 8th Edition - Clinical stage from 09/20/2020: Stage IV (cT1b, cN2, cM1) - Signed by [name], MD on 12/13/2021   10/03/2020 Imaging   MRI brain with and without contrast showed no evidence of intracranial metastatic disease. Well-defined round lesion within the left parotid tail measuring approximately 2.3 x 1.8 x 1.8 cm   10/09/2020 - 11/27/2020 Radiation Therapy   concurrent chemoradiation   10/10/2020 - 10/31/2020 Chemotherapy   10/10/2020 concurrent chemoradiation.  carboplatin AUC of 2 and Taxol 45 mg/m2 x 4.  Additional chemotherapy was held due to thrombocytopenia, neutropenia       12/31/2020 - 03/21/2022 Chemotherapy   Maintenance  Durvalumab q14d      12/31/2020 -  Chemotherapy   Patient is on Treatment Plan : LUNG Durvalumab (10) q14d     03/28/2021 Imaging   CT without contrast showed new linear right perihilar consolidation with associated traction bronchiectasis.  Likely postradiation changes.  Additional new peribronchial vascular/nodular groundglass opacities with areas of consolidation or seen primarily in the right upper and lower lobes, more distant from expected radiation fields. 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  Per my discussion with pulmonology Dr. 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     05/09/2021 Imaging   Repeat CT chest without contrast showed evolving radiation changes in the right  perihilar region likely accounting for interval increase to thickening of the posterior wall of the right upper lobe bronchus.  The more peripheral right lung groundglass opacities seen on most recent study have generally improved with exception of one peripherally right upper lobe which appears new.  These are likely inflammatory.  No findings highly suspicious for local recurrence.   05/30/2021 - 01/10/2022 Chemotherapy   Durvalumab q14d     10/07/2021 Imaging   MRI brain with and without contrast showed 2 contrast-enhancing lesions, most consistent with metastatic disease.  Largest lesion is in the left frontal lobe and measures up to 1.3 cm with a large amount of surrounding edema and 9 mm rightward midline shift.  The other enhancing lesion was 4 mm in the right occipital lobe with a small amount of surrounding edema.  No acute or chronic hemorrhage.   10/07/2021 Progression   Headache after colonoscopy, progressive difficulty using her right arm.  Progressively worsening of speech. Stage IV lung cancer with brain metastasis.  started on Dexamethasone.  He was seen by neurosurgery and radiation oncology.  Recommendation is to proceed with SRS.  Patient was discharged with dexamethasone   10/16/2021 Imaging   PET scan showed postradiation changes in the right supra hilar lung without evidence of local recurrence.  No evidence of lung cancer recurrence or metastatic disease.  Single focus of intense metabolic activity associated with posterior left 11th rib likely secondary to recent fall/posttraumatic metabolic changes.  Frontal  uptake in the left frontal lobe corresponding to the known metastatic lesion in the brain   10/29/2021 -  Radiation Therapy   Brain Radiation [facility]   12/10/2021 Imaging   Brain MRI: Continued interval decrease in size of a left frontal lobe metastasis, now measuring 10 mm. Moderate surrounding edema has also decreased.  3-4 mm metastasis within the right occipital lobe, not  significantly changed in size. There is now a small focus of central non enhancement within this lesion. Increased edema. 3 mm enhancing metastasis within the high left parietal lobe. In retrospect, this was likely present as a subtle punctate focus of enhancement on the prior brain MRI of 10/22/2021.   01/18/2022 Imaging   CT chest abdomen pelvis w contrast 1. Stable post treatment changes in the right upper lobe medially with dense radiation fibrosis but no findings to suggest local recurrent tumor. 2. Significant progression of ill-defined ground-glass opacity, interstitial thickening and airspace nodularity in the right upper lobe and upper aspect of the right lower lobe. Findings could reflect an inflammatory or atypical infectious process, drug induced pneumonitis or interstitial spread of tumor.3. No mediastinal or hilar mass or adenopathy. 4. No findings for abdominal/pelvic metastatic disease or osseous metastatic disease.   02/19/2022 Imaging   MRI brain w wo contrast showed Three metastatic deposits in the brain appear smaller. Decreased edema in the left parietal lobe. No new lesions    04/04/2022 Imaging   CT chest w contrast 1. Stable radiation changes in the right perihilar region without evidence of local recurrence. 2. Interval improvement in the patchy ground-glass opacities previously demonstrated in both lungs, most consistent with a resolving infectious or inflammatory process (including immunotherapy-related pneumonitis). 3. No evidence of metastatic disease.   05/14/2022 Miscellaneous   He had a mechanical fall and fractured  his left elbow. xrays showed a left elbow fracture  05/26/22 seen by orthopedic surgeon, cast applied.    05/27/2022 Imaging   MRI brain w wo contrast 1. All three known brain metastases have enlarged since August, with increasing regional edema. And there is a small new left anterior cerebellar 4-5 mm metastasis (was questionably punctate on the May  MRI). This constellation could reflect a combination of True Progression (Left Cerebellar lesion) and pseudoprogression/radionecrosis (3 existing mets).    2. No significant intracranial mass effect. 3. Underlying moderate cerebral white matter signal changes.   05/30/2022 Imaging   CT chest abdomen pelvis w contrast  1. 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 There is a stable small right pleural effusion, but no definitive findings to suggest residual or recurrent disease on today's examination. 2. No signs of metastatic disease in the abdomen or pelvis.  3. Aortic atherosclerosis, in addition to two-vessel coronary artery disease. Please note that although the presence of coronary artery calcium documents the presence of coronary artery disease, the severity of this disease and any potential stenosis cannot be assessed on this non-gated CT examination. Assessment for potential risk factor modification, dietary therapy or pharmacologic therapy may be warranted, if clinically indicated. 4. Hepatic steatosis. 5. Colonic diverticulosis without evidence of acute diverticulitis at this time. 6. Incompletely imaged but apparently occluded vascular graft in the superior aspect of the right anterior thigh. Referral to vascular specialist for further clinical evaluation should be considered if clinically appropriate. 7. Additional incidental findings, as above.   07/22/2022 Imaging   MRI brain  1. No new or enlarging lesions. No intracranial disease progression. 2. Decreased size of treated lesions of the left frontoparietal junction and left postcentral gyrus. 3. Unchanged lesions in the left cerebellum and right occipital lobe.   10/15/2022 Imaging   MRI brain w wo contrast showed Interval increase in size of multiple intraparenchymal lesions with worsened surrounding vasogenic  edema. 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 No new lesions   11/05/2022 Imaging   MRI brain w wo contrast  1. Likely slight increase and right occipital and left cerebellar lesions. Otherwise, unchanged metastases, as above. No new lesions identified. 2. Partially imaged large probable lymph node in the left upper neck, apparent on prior PET CT.   11/13/2022 -  Radiation Therapy   Each of the sites were treated with SRS/SBRT/SRT-IMRT technique to 20 Gy in 1 fraction to each of the following sites: PTV_2_LCerebel_60mm PTV_3_RCerebel_37mm PTV_4_ROccip_81mm PTV_5_LParietal_41mm   11/27/2022 Imaging   CT chest abdomen pelvis w contrast showed  Improving bilateral scattered interstitial and ground-glass opacities. Small amount of residual areas in the extreme left lower lobe inferiorly. This specific area is slightly increased from previous but overall the amount of opacities is significantly improved. Recommend continued follow-up. No developing new mass lesion, fluid collection or lymph node enlargement. Stable posttreatment changes right perihilar in the lung. Fatty liver infiltration. Colonic diverticulosis. Improved tiny right pleural effusion    Today he feels well.Intermittent headache, is better. denies any focal deficits. chronic SOB,not worse.  occasional cough. No new complaints.    Review of Systems  Constitutional:  Positive for fatigue. Negative for appetite change, chills, fever and unexpected weight change.  HENT:   Negative for hearing loss and voice change.   Eyes:  Negative for eye problems and icterus.  Respiratory:  Negative  for chest tightness, cough and shortness of breath.   Cardiovascular:  Negative for chest pain and leg swelling.  Gastrointestinal:  Negative for abdominal distention and abdominal pain.  Endocrine: Negative for hot flashes.  Genitourinary:  Negative for difficulty  urinating, dysuria and frequency.   Musculoskeletal:  Negative for arthralgias.  Skin:  Negative for itching and rash.  Neurological:  Positive for extremity weakness. Negative for headaches, light-headedness, numbness and speech difficulty.  Hematological:  Negative for adenopathy. Does not bruise/bleed easily.  Psychiatric/Behavioral:  Negative for confusion.       MEDICAL HISTORY:  Past Medical History:  Diagnosis Date   Allergy    Cancer (HCC)    lung   COPD (chronic obstructive pulmonary disease) (HCC)    Dyspnea    former smoker. only doe   History of kidney stones    Hypertension    Peripheral vascular disease (HCC)    Pre-diabetes    Sleep apnea     SURGICAL HISTORY: Past Surgical History:  Procedure Laterality Date   ANTERIOR CERVICAL DECOMP/DISCECTOMY FUSION N/A 03/14/2020   Procedure: ANTERIOR CERVICAL DECOMPRESSION/DISCECTOMY FUSION 3 LEVELS C4-7;  Surgeon: Venetia Night, MD;  Location: [facility] ORS;  Service: Neurosurgery;  Laterality: N/A;   BACK SURGERY  2011   CENTRAL LINE INSERTION Right 09/10/2016   Procedure: CENTRAL LINE INSERTION;  Surgeon: Renford Dills, MD;  Location: [facility] ORS;  Service: Vascular;  Laterality: Right;   CERVICAL FUSION     C4-c7 on Mar 14, 2020   COLONOSCOPY  2013 ?   COLONOSCOPY N/A 09/09/2021   Procedure: COLONOSCOPY;  Surgeon: Jaynie Collins, DO;  Location: [facility] ENDOSCOPY;  Service: Gastroenterology;  Laterality: N/A;   CORONARY ANGIOPLASTY     ESOPHAGOGASTRODUODENOSCOPY N/A 09/09/2021   Procedure: ESOPHAGOGASTRODUODENOSCOPY (EGD);  Surgeon: Jaynie Collins, DO;  Location: [facility] ENDOSCOPY;  Service: Gastroenterology;  Laterality: N/A;   FEMORAL-POPLITEAL BYPASS GRAFT Right 09/10/2016   Procedure: BYPASS GRAFT FEMORAL-POPLITEAL ARTERY ( BELOW KNEE );  Surgeon: Renford Dills, MD;  Location: [facility];  Service: Vascular;  Laterality: Right;   FEMORAL-TIBIAL BYPASS GRAFT Right 03/23/2020   Procedure: BYPASS GRAFT RIGHT  FEMORAL- DISTAL TIBIAL ARTERY WITH DISTAL FLOW GRAFT;  Surgeon: Renford Dills, MD;  Location: [facility]S;  Service: Vascular;  Laterality: Right;   LEFT HEART CATH AND CORONARY ANGIOGRAPHY Left 05/16/2021   Procedure: LEFT HEART CATH AND CORONARY ANGIOGRAPHY;  Surgeon: Armando Reichert, MD;  Location: [facility] INVASIVE CV LAB;  Service: Cardiovascular;  Laterality: Left;   LOWER EXTREMITY ANGIOGRAPHY Right 11/18/2016   Procedure: Lower Extremity Angiography;  Surgeon: Renford Dills, MD;  Location: [facility] INVASIVE CV LAB;  Service: Cardiovascular;  Laterality: Right;   LOWER EXTREMITY ANGIOGRAPHY Right 12/09/2016   Procedure: Lower Extremity Angiography;  Surgeon: Renford Dills, MD;  Location: [facility] INVASIVE CV LAB;  Service: Cardiovascular;  Laterality: Right;   LOWER EXTREMITY ANGIOGRAPHY Right 11/15/2019   Procedure: LOWER EXTREMITY ANGIOGRAPHY;  Surgeon: Renford Dills, MD;  Location: [facility] INVASIVE CV LAB;  Service: Cardiovascular;  Laterality: Right;   LOWER EXTREMITY ANGIOGRAPHY Right 12/01/2019   Procedure: Lower Extremity Angiography;  Surgeon: Annice Needy, MD;  Location: [facility] INVASIVE CV LAB;  Service: Cardiovascular;  Laterality: Right;   LOWER EXTREMITY ANGIOGRAPHY Right 12/02/2019   Procedure: LOWER EXTREMITY ANGIOGRAPHY;  Surgeon: Renford Dills, MD;  Location: [facility] INVASIVE CV LAB;  Service: Cardiovascular;  Laterality: Right;   LOWER EXTREMITY ANGIOGRAPHY Right 03/23/2020   Procedure: Lower Extremity Angiography;  Surgeon:  Schnier, Latina Craver, MD;  Location: [facility] INVASIVE CV LAB;  Service: Cardiovascular;  Laterality: Right;   LOWER EXTREMITY ANGIOGRAPHY Right 09/25/2021   Procedure: Lower Extremity Angiography;  Surgeon: Renford Dills, MD;  Location: [facility] INVASIVE CV LAB;  Service: Cardiovascular;  Laterality: Right;   LOWER EXTREMITY ANGIOGRAPHY Right 09/26/2021   Procedure: Lower Extremity Angiography;  Surgeon: Annice Needy, MD;  Location: [facility] INVASIVE CV LAB;  Service:  Cardiovascular;  Laterality: Right;   LOWER EXTREMITY ANGIOGRAPHY Right 04/08/2022   Procedure: Lower Extremity Angiography;  Surgeon: Renford Dills, MD;  Location: [facility] INVASIVE CV LAB;  Service: Cardiovascular;  Laterality: Right;   LOWER EXTREMITY INTERVENTION  11/18/2016   Procedure: Lower Extremity Intervention;  Surgeon: Renford Dills, MD;  Location: [facility] INVASIVE CV LAB;  Service: Cardiovascular;;   PERIPHERAL VASCULAR CATHETERIZATION Right 01/02/2015   Procedure: Lower Extremity Angiography;  Surgeon: Renford Dills, MD;  Location: [facility] INVASIVE CV LAB;  Service: Cardiovascular;  Laterality: Right;   PERIPHERAL VASCULAR CATHETERIZATION Right 06/18/2015   Procedure: Lower Extremity Angiography;  Surgeon: Annice Needy, MD;  Location: [facility] INV[facility] LAB;  Service: Cardiovascular;  Laterality: Right;   PERIPHERAL VASCULAR CATHETERIZATION  06/18/2015   Procedure: Lower Extremity Intervention;  Surgeon: Annice Needy, MD;  Location: [facility] INVASIVE CV LAB;  Service: Cardiovascular;;   PERIPHERAL VASCULAR CATHETERIZATION N/A 10/09/2015   Procedure: Abdominal Aortogram w/Lower Extremity;  Surgeon: Renford Dills, MD;  Location: [facility] INV[facility];  Service: Cardiovascular;  Laterality: N/A;   PERIPHERAL VASCULAR CATHETERIZATION  10/09/2015   Procedure: Lower Extremity Intervention;  Surgeon: Renford Dills, MD;  Location: [facility];  Service: Cardiovascular;;   PERIPHERAL VASCULAR CATHETERIZATION Right 10/10/2015   Procedure: Lower Extremity Angiography;  Surgeon: Annice Needy, MD;  Location: [facility];  Service: Cardiovascular;  Laterality: Right;   PERIPHERAL VASCULAR CATHETERIZATION Right 10/10/2015   Procedure: Lower Extremity Intervention;  Surgeon: Annice Needy, MD;  Location: [facility];  Service: Cardiovascular;  Laterality: Right;   PERIPHERAL VASCULAR CATHETERIZATION Right 12/03/2015   Procedure: Lower Extremity Angiography;  Surgeon: Annice Needy,  MD;  Location: [facility];  Service: Cardiovascular;  Laterality: Right;   PERIPHERAL VASCULAR CATHETERIZATION Right 12/04/2015   Procedure: Lower Extremity Angiography;  Surgeon: Annice Needy, MD;  Location: [facility];  Service: Cardiovascular;  Laterality: Right;   PERIPHERAL VASCULAR CATHETERIZATION  12/04/2015   Procedure: Lower Extremity Intervention;  Surgeon: Annice Needy, MD;  Location: [facility];  Service: Cardiovascular;;   PERIPHERAL VASCULAR CATHETERIZATION Right 06/30/2016   Procedure: Lower Extremity Angiography;  Surgeon: Annice Needy, MD;  Location: [facility] IN[facility];  Service: Cardiovascular;  Laterality: Right;   PERIPHERAL VASCULAR CATHETERIZATION Right 06/25/2016   Procedure: Lower Extremity Angiography;  Surgeon: Renford Dills, MD;  Location: [facility];  Service: Cardiovascular;  Laterality: Right;   PERIPHERAL VASCULAR CATHETERIZATION Right 07/01/2016   Procedure: Lower Extremity Angiography;  Surgeon: Renford Dills, MD;  Location: [facility];  Service: Cardiovascular;  Laterality: Right;   PERIPHERAL VASCULAR CATHETERIZATION  07/01/2016   Procedure: Lower Extremity Intervention;  Surgeon: Renford Dills, MD;  Location: [facility];  Service: Cardiovascular;;   PERIPHERAL VASCULAR CATHETERIZATION Right 08/01/2016   Procedure: Lower Extremity Angiography;  Surgeon: Renford Dills, MD;  Location: [facility] IN[facility];  Service: Cardiovascular;  Laterality: Right;   PORTA CATH INSERTION N/A 10/05/2020  Procedure: PORTA CATH INSERTION;  Surgeon: Renford Dills, MD;  Location: [facility] INV[facility] LAB;  Service: Cardiovascular;  Laterality: N/A;   stent placement in right leg Right    VIDEO BRONCHOSCOPY N/A 09/14/2020   Procedure: VIDEO BRONCHOSCOPY WITH FLUORO;  Surgeon: Salena Saner, MD;  Location: [facility];  Service: Cardiopulmonary;  Laterality: N/A;   VIDEO BRONCHOSCOPY WITH ENDOBRONCHIAL ULTRASOUND  N/A 09/14/2020   Procedure: VIDEO BRONCHOSCOPY WITH ENDOBRONCHIAL ULTRASOUND;  Surgeon: Salena Saner, MD;  Location: [facility];  Service: Cardiopulmonary;  Laterality: N/A;    SOCIAL HISTORY: Social History   Socioeconomic History   Marital status: Single    Spouse name: Not on file   Number of children: Not on file   Years of education: Not on file   Highest education level: Not on file  Occupational History   Occupation: unemployed  Tobacco Use   Smoking status: Former    Packs/day: 2.00    Years: 38.00    Additional pack years: 0.00    Total pack years: 76.00    Types: Cigarettes    Quit date: 12/20/2014    Years since quitting: 8.0   Smokeless tobacco: Never   Tobacco comments:    quit smoking in 2017 most smoked 2   Vaping Use   Vaping Use: Former  Substance and Sexual Activity   Alcohol use: Not Currently   Drug use: No   Sexual activity: Yes  Other Topics Concern   Not on file  Social History Narrative   Not on file   Social Determinants of Health   Financial Resource Strain: Not on file  Food Insecurity: Not on file  Transportation Needs: Not on file  Physical Activity: Not on file  Stress: Not on file  Social Connections: Not on file  Intimate Partner Violence: Not on file    FAMILY HISTORY: Family History  Problem Relation Age of Onset   Diabetes Mother    Hypertension Mother    Heart murmur Mother    Leukemia Mother    Throat cancer Maternal Grandmother    Colon cancer Neg Hx    Breast cancer Neg Hx     ALLERGIES:  has No Known Allergies.  MEDICATIONS:  Current Outpatient Medications  Medication Sig Dispense Refill   aspirin EC 81 MG tablet Take 81 mg by mouth daily. Swallow whole.     clopidogrel (PLAVIX) 75 MG tablet TAKE 1 TABLET BY MOUTH DAILY 100 tablet 2   levothyroxine (SYNTHROID) 100 MCG tablet Take 1 tablet (100 mcg total) by mouth daily before breakfast. 90 tablet 0   simvastatin (ZOCOR) 40 MG tablet Take 40 mg by mouth at  bedtime.     No current facility-administered medications for this visit.   Facility-Administered Medications Ordered in Other Visits  Medication Dose Route Frequency Provider Last Rate Last Admin   albuterol (PROVENTIL) (2.5 MG/3ML) 0.083% nebulizer solution 2.5 mg  2.5 mg Nebulization Once Salena Saner, MD         PHYSICAL EXAMINATION: ECOG PERFORMANCE STATUS: 1 - Symptomatic but completely ambulatory Vitals:   01/23/23 0846  BP: (!) 141/74  Pulse: 88  Resp: 16  Temp: 97.8 F (36.6 C)  SpO2: 100%   Filed Weights   01/23/23 0846  Weight: 222 lb 3.2 oz (100.8 kg)    Physical Exam Constitutional:      General: He is not in acute distress.    Appearance: He is obese. He is not diaphoretic.  Comments: Patient walks with a cane  HENT:     Head: Normocephalic and atraumatic.  Eyes:     General: No scleral icterus. Cardiovascular:     Rate and Rhythm: Normal rate and regular rhythm.     Heart sounds: No murmur heard. Pulmonary:     Effort: Pulmonary effort is normal. No respiratory distress.  Abdominal:     General: There is no distension.     Palpations: Abdomen is soft.     Tenderness: There is no abdominal tenderness.  Musculoskeletal:     Cervical back: Normal range of motion and neck supple.  Skin:    General: Skin is warm and dry.     Findings: No erythema.  Neurological:     Mental Status: He is alert and oriented to person, place, and time. Mental status is at baseline.     Cranial Nerves: No cranial nerve deficit.     Motor: No abnormal muscle tone.  Psychiatric:        Mood and Affect: Mood and affect normal.      LABORATORY DATA:  I have reviewed the data as listed    Latest Ref Rng & Units 01/23/2023    8:35 AM 01/08/2023    8:01 AM 12/25/2022    8:23 AM  CBC  WBC 4.0 - 10.5 K/uL 5.5  5.6  5.1   Hemoglobin 13.0 - 17.0 g/dL 16.1  09.6  04.5   Hematocrit 39.0 - 52.0 % 40.3  38.7  41.4   Platelets 150 - 400 K/uL 232  221  252        Latest Ref Rng & Units 01/23/2023    8:35 AM 01/08/2023    8:01 AM 12/25/2022    8:23 AM  CMP  Glucose 70 - 99 mg/dL 409  811  914   BUN 8 - 23 mg/dL 11  17  12    Creatinine 0.61 - 1.24 mg/dL 7.82  9.56  2.13   Sodium 135 - 145 mmol/L 140  142  140   Potassium 3.5 - 5.1 mmol/L 3.8  3.4  3.7   Chloride 98 - 111 mmol/L 106  108  106   CO2 22 - 32 mmol/L 24  24  24    Calcium 8.9 - 10.3 mg/dL 9.0  9.1  9.0   Total Protein 6.5 - 8.1 g/dL 6.4  6.9  6.9   Total Bilirubin 0.3 - 1.2 mg/dL 0.2  0.5  0.3   Alkaline Phos 38 - 126 U/L 64  62  71   AST 15 - 41 U/L 32  25  23   ALT 0 - 44 U/L 27  20  18       RADIOGRAPHIC STUDIES: I have personally reviewed the radiological images as listed and agreed with the findings in the report. VAS [place] ABI WITH/WO TBI  Result Date: 01/08/2023  LOWER EXTREMITY DOPPLER STUDY Patient Name:  BARTLETT MUHLENKAMP  Date of Exam:   01/05/2023 Medical Rec #: [id number]            Accession #:    [id number] Date of Birth: [birth year]           Patient Gender: M Patient Age:   35 years Exam Location:  [facility] Procedure:      VAS [place] ABI WITH/WO TBI Referring Phys: Sheppard Plumber --------------------------------------------------------------------------------  Indications: Claudication, peripheral artery disease, and Routine f/u. High Risk Factors: Hyperlipidemia. Other Factors: ASO s/p multiple interventions and bypass.  11/15/2019 Right thrombectomy of Fempop insitu graft with added                stent throughout.                 12/01/2019: Aortogram and Selctive Right Lower Extremity                Angiogram. 6 mg of TPA delivered in a lysis catheterfrom the                Origin of the Right Femoral -Popliteal bypass down the proximal                ATA with placement of the lysis.                 12/02/2019: PTA of the Right Posterior Tibial Artery. PTA of the                Right Peroneal Artery. Mechanical Thrombectomy of the Right                 Femoral -Popliteal Bypass graft. Mechanical Thrombectomy of the                Right Tibioperoneal Trunk and Posterior Tibial Artery.                 09/25/2021: Right Lower Extremity Angiography third order                catheter placement. PTA of the proximal peroneal and                tibioperoneal trunk to 2.5 mm with an ultra score balloon. PTA of                the Femoral Artery and the proximal anstomosis of the bypass                graft to 4 mm with an ultra score balloon. Administration of 5 mg                of tpa throughout the entire bypass and in the peroneal Artery.                Initiation of continuous tpa infusion Right lower Extremity for                limb salvage. Insertion of a triple lumen catheter Left CFV with                [place] and fluoroscopic guidance.                 09/26/2021: Right LE Angiogram. PTA of the Right Posterior Tibial                Artery with 2.5 mm diameter by 30 cm length angioplasty balloon.                PTA of the distal bypass anastomosis and distal bypass graft                analogous to the Popliteal artery with 4 mm diameter by 8 cm                length Lutonix drug coated angioplasty balloon.                04/08/2022 right thrombectomy of the Deep profunda.  Vascular Interventions: Hx of multiple right lower extremity PTA's/stents.  09/10/2016 Right femoral to below the knee popliteal                         artery bypass graft placement. 5/01 Right bypass graft &                         mid popliteal artery PTA & coil embolectomy of large                         branch of bypass graft. 12/09/16 Right distal bypass                         graft PTA/stent and right posterior tibial artery PTA.                         03/23/2020 Rt PTA thrombectomy. Rt Profunda fem artery to                         PTA bypass. Comparison Study: 09/05/2022 Performing Technologist: Debbe Bales RVS  Examination Guidelines: A complete evaluation  includes at minimum, Doppler waveform signals and systolic blood pressure reading at the level of bilateral brachial, anterior tibial, and posterior tibial arteries, when vessel segments are accessible. Bilateral testing is considered an integral part of a complete examination. Photoelectric Plethysmograph (PPG) waveforms and toe systolic pressure readings are included as required and additional duplex testing as needed. Limited examinations for reoccurring indications may be performed as noted.  ABI Findings: +---------+------------------+-----+------------------+--------+ Right    Rt Pressure (mmHg)IndexWaveform          Comment  +---------+------------------+-----+------------------+--------+ Brachial 155                                               +---------+------------------+-----+------------------+--------+ ATA                             Non pulsatile flow         +---------+------------------+-----+------------------+--------+ PTA      69                0.45 monophasic                 +---------+------------------+-----+------------------+--------+ PERO     56                0.36 monophasic                 +---------+------------------+-----+------------------+--------+ Great Toe66                0.43 Abnormal                   +---------+------------------+-----+------------------+--------+ +---------+------------------+-----+---------+-------+ Left     Lt Pressure (mmHg)IndexWaveform Comment +---------+------------------+-----+---------+-------+ Brachial 155                                     +---------+------------------+-----+---------+-------+ ATA      158               1.02 triphasic        +---------+------------------+-----+---------+-------+ PTA      159  1.03 triphasic        +---------+------------------+-----+---------+-------+ Great Toe158               1.02 Normal            +---------+------------------+-----+---------+-------+ +-------+-----------+-----------+------------+------------+ ABI/TBIToday's ABIToday's TBIPrevious ABIPrevious TBI +-------+-----------+-----------+------------+------------+ Right  .45        .43        .63         .31          +-------+-----------+-----------+------------+------------+ Left   1.03       1.02       1.11        .91          +-------+-----------+-----------+------------+------------+ Bilateral TBIs appear increased compared to prior study on 09/05/2022. Left ABIs appear essentially unchanged compared to prior study on 09/05/2022. The Right ABIs appear to be decreased compared to prior study on 09/05/2022.  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 The right toe-brachial index is abnormal. Imaged the Right ATA and Peroneal Artery at the level of the Ankle; Non pulsatile flow seen in the Right ATA and Monophasic flow seen in the Peroneal Artery. Left: Resting left ankle-brachial index is within normal range. The left toe-brachial index is normal. *See table(s) above for measurements and observations.   Electronically signed by Levora Dredge MD on 01/08/2023 at 3:23:11 PM.    Final    CT CHEST ABDOMEN PELVIS W CONTRAST  Result Date: 11/27/2022 CLINICAL DATA:  Right-sided lung cancer, squamous cell carcinoma. Staging. * Tracking Code: BO * EXAM: CT CHEST, ABDOMEN, AND PELVIS WITH CONTRAST TECHNIQUE: Multidetector CT imaging of the chest, abdomen and pelvis was performed following the standard protocol during bolus administration of intravenous contrast. RADIATION DOSE REDUCTION: This exam was performed according to the departmental dose-optimization program which includes automated exposure control, adjustment of the mA and/or kV according to patient size and/or use of iterative reconstruction technique. CONTRAST:  OMNIPAQUE IOHEXOL 300 MG/ML  SOLN  COMPARISON:  CT 08/25/2022 and older FINDINGS: CT CHEST FINDINGS Cardiovascular: Right upper chest port. Heart is nonenlarged. No pericardial effusion. Slight pericardial thickening. The thoracic aorta has a normal course and caliber. Mediastinum/Nodes: Slightly patulous thoracic esophagus. Small thyroid gland. No specific abnormal lymph node enlargement seen in the axillary regions, hilum or mediastinum. Lungs/Pleura: Left lung is without consolidation, pneumothorax or effusion. Previously there was some patchy ground-glass in the left lung with some reticular changes which has significantly improved. There is some subtle changes remaining in the left lower lobe dependently. The specific areas are increased from previous but overall the amount in the left lung is markedly improved. The right lung demonstrates some volume loss with thickening and bronchiectasis with some distortion right perihilar involving the right upper lobe which could be posttreatment changes. There are some changes as well there are similar and stable along the superior segment of the right lower lobe. No right-sided new consolidation, pneumothorax or effusion. No new dominant nodule. The previous reticulonodular changes in the right lung on the prior have also substantially improved. Musculoskeletal: Fixation hardware seen along the lower cervical spine at the edge of the imaging field. Scattered endplate osteophytes are seen throughout the thoracic spine with multilevel stenosis. Old left-sided lateral rib fractures inferiorly. CT ABDOMEN PELVIS FINDINGS Hepatobiliary: Mild fatty liver infiltration. The gallbladder is nondilated. Patent portal vein. No space-occupying liver lesion. Pancreas: Unremarkable. No pancreatic ductal dilatation or surrounding inflammatory changes. Spleen: Normal in size without focal abnormality.  Adrenals/Urinary Tract: The adrenal glands are preserved. Mild bilateral renal atrophy. No enhancing renal mass or  collecting system dilatation. The ureters have normal course and caliber down to the bladder. Preserved contours of the urinary bladder. Stomach/Bowel: On this non oral contrast exam, large bowel has a normal course and caliber. Scattered stool. Left-sided colonic diverticulosis. Normal appendix in the right lower quadrant. There is some debris in the stomach. Small bowel is nondilated. Vascular/Lymphatic: Aortic atherosclerosis. No enlarged abdominal or pelvic lymph nodes. There are areas of stenosis suggested along the internal iliac vessels. At the edge of the imaging field are evidence of bypass grafts along the right thigh which appears occluded. SFA appears occluded. Please correlate with clinical history. Stable prominent right inguinal node on series 2, image 142. No retroperitoneal or other abdominal nodal enlargement. Reproductive: Prostate is unremarkable. Other: No abdominal wall hernia or abnormality. No abdominopelvic ascites. Musculoskeletal: Degenerative changes seen of the spine and pelvis. There is streak artifact related to the fixation hardware at the lumbosacral spine. IMPRESSION: Improving bilateral scattered interstitial and ground-glass opacities. Small amount of residual areas in the extreme left lower lobe inferiorly. This specific area is slightly increased from previous but overall the amount of opacities is significantly improved. Recommend continued follow-up. No developing new mass lesion, fluid collection or lymph node enlargement. Stable posttreatment changes right perihilar in the lung. Fatty liver infiltration. Colonic diverticulosis. Improved tiny right pleural effusion Electronically Signed   By: Karen Kays M.D.   On: 11/27/2022 16:15   MR Brain W Wo Contrast  Result Date: 11/06/2022 CLINICAL DATA:  Brain metastases, assess treatment response 3T SRS Protocol for radiation treatment planning. EXAM: MRI HEAD WITHOUT AND WITH CONTRAST TECHNIQUE: Multiplanar, multiecho pulse  sequences of the brain and surrounding structures were obtained without and with intravenous contrast. CONTRAST:  10 mL of Vueway COMPARISON:  MRI head October 14, 2022. FINDINGS: Brain: Similar size of proximally 21 mm peripherally enhancing lesion in the left frontal lobe (series 13, image 113) and 14 mm peripherally enhancing lesion in the left parietal lobe (series 13, image 144). Likely slight increase of 17 mm lesion the right occipital lobe (series 13, image 49). Likely slight increase in 7 mm enhancing lesion left cerebellum. Similar small right cerebellar lesion (image 44). No evidence of acute infarct, acute hemorrhage, midline shift or hydrocephalus. Vascular: Major arterial flow voids are maintained at the skull base. Skull and upper cervical spine: Normal marrow signal. Sinuses/Orbits: Largely clear sinuses.  No acute orbital findings. Other: Partially imaged probable large lymph node in the left upper neck. IMPRESSION: 1. Likely slight increase and right occipital and left cerebellar lesions. Otherwise, unchanged metastases, as above. No new lesions identified. 2. Partially imaged large probable lymph node in the left upper neck, apparent on prior PET CT. Electronically Signed   By: Feliberto Harts M.D.   On: 11/06/2022 10:24

## 2023-01-24 LAB — T4: T4, Total: 8.4 ug/dL (ref 4.5–12.0)

## 2023-02-04 ENCOUNTER — Encounter: Payer: Self-pay | Admitting: Pulmonary Disease

## 2023-02-04 ENCOUNTER — Ambulatory Visit (INDEPENDENT_AMBULATORY_CARE_PROVIDER_SITE_OTHER): Payer: Medicare Other | Admitting: Pulmonary Disease

## 2023-02-04 VITALS — BP 140/70 | HR 95 | Temp 98.1°F | Ht 71.0 in | Wt 221.8 lb

## 2023-02-04 DIAGNOSIS — J984 Other disorders of lung: Secondary | ICD-10-CM

## 2023-02-04 DIAGNOSIS — C3491 Malignant neoplasm of unspecified part of right bronchus or lung: Secondary | ICD-10-CM

## 2023-02-04 DIAGNOSIS — R0602 Shortness of breath: Secondary | ICD-10-CM | POA: Diagnosis not present

## 2023-02-04 NOTE — Patient Instructions (Signed)
Your lungs sounded clear today.  Will see him in follow-up in 4 months time call sooner should any new problems arise.

## 2023-02-04 NOTE — Progress Notes (Signed)
Subjective:    Patient ID: Joshua [name], male    DOB: 11-14-1955, 67 y.o.   MRN: [id number]  Patient Care Team: Patrice Paradise, MD as PCP - General (Physician Assistant) Patrice Paradise, MD as Referring Physician (Physician Assistant) Lemar Livings, Merrily Pew, MD (General Surgery) Glory Buff, RN as Oncology Nurse Navigator [name], MD as Consulting Physician (Oncology)  Chief Complaint  Patient presents with   Follow-up    No concerns.    HPI Patient is a 67 year old former smoker (41 PY) with a history of COPD, shortness of breath on exertion and squamous cell carcinoma of the right upper lobe, stage IV disease who presents for follow-up.  He was last seen on April 2024.  Recall that the patient received concomitant chemoradiation for his stage IV squamous cell cancer, followed by durvalumab maintenance as he is PD-L1 positive.  He had a break on durvalumab therapy due to groundglass opacities noted on CT chest that were consistent with check point inhibitor pneumonitis.  A follow-up CT chest performed on 04 April 2022 showed marked improvement on the groundglass opacities.  The patient then restarted Durvalumab. However, recent CT chest on 25 August 2022 has shown some new perilymphatic ill-defined nodularity that can be seen in the setting of LIP versus lymphangitic carcinomatosis.  There are postradiation changes in the right lung with no evidence of distant metastatic disease.  He has not had any symptoms related to this.  His Durvalumab was then discontinued on 9 February after the CT results were known.  His disease was initially staged as a III a however, in March 2023 he was noted to have 2 enhancing lesions in the brain and is now staged at a IV.  He received radiation to the brain.  Lesions for that responded initially however on March 2024 he was noted to have the lesions increasing.  He underwent repeat radiation to the brain lesions at [facility].  He did had follow-up chest CT on 25 Nov 2022 that showed that the previously noted changes of groundglass and perilymphatic ill-defined nodularity had resolved.  He then restarted Durvalumab.  He has not had any issues with increasing shortness of breath since restarting Durvalumab.  He gets infusions every 14 days.   Has not had any fevers, chills or sweats since his last visit.He is on Plavix for his severe peripheral artery disease status post thrombosis and subsequent thrombectomy of the right lower extremity.   He discontinued all inhalers as he did not feel that these were helping him.  He has not had any cough or sputum production. No hemoptysis.  He has had increase nasal congestion and nasal trends.  No epistaxis.  He does not endorse any other symptomatology today.   DATA 07/18/2021 PFTs: FEV1 2.80 L or 76% predicted, FVC 3.61 L or 74% predicted, there is a small airways component. No significant bronchodilator response.  Lung volumes mildly reduced.  Diffusion capacity 71% by Kco. 10/14/2022 PFTs: FEV1 2.49 L or 69% predicted, FVC 3.29 L or 68% predicted, FEV1/FVC 76%, lung volumes are moderately reduced.  Diffusion capacity moderately reduced.  There is a minimal/mild obstructive component that may be underestimated due to the degree of restriction.  Overall mild decline in lung function compared to 2022 study.  Review of Systems A 10 point review of systems was performed and it is as noted above otherwise negative.   Patient Active Problem List   Diagnosis Date Noted   Supracondylar  fracture of left humerus 05/23/2022   GERD (gastroesophageal reflux disease) 01/25/2022   Pneumonitis 01/25/2022   COPD (chronic obstructive pulmonary disease) (HCC) 12/28/2021   Hypocalcemia 12/28/2021   Malignant neoplasm metastatic to brain (HCC) 10/08/2021   Hypothyroidism, unspecified 10/08/2021   Parotid nodule 08/16/2021   Dysphagia 06/20/2021   COVID 06/20/2021   Organizing  pneumonia (HCC) 04/04/2021   Hypothyroidism due to medication 04/04/2021   Encounter for antineoplastic immunotherapy 04/04/2021   Antineoplastic chemotherapy induced anemia 10/17/2020   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/20/2020   Goals of care, counseling/discussion 08/30/2020   Iron deficiency anemia 08/30/2020   Ischemia of extremity 03/23/2020   Cervical myelopathy (HCC) 03/14/2020   Ischemia of right lower extremity 11/15/2019   Peripheral neuropathy 10/14/2019   Chronic left-sided low back pain without sciatica 04/13/2019   History of back surgery 04/13/2019   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) 11/18/2016   Complication associated with vascular device 11/13/2016   Hyperlipidemia, unspecified 06/18/2016   Radiculopathy of lumbar region 06/10/2016   PVD (peripheral vascular disease) (HCC) 05/08/2016   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 Swallow whole.   clopidogrel (PLAVIX) 75 MG tablet TAKE 1 TABLET BY MOUTH DAILY   levothyroxine (SYNTHROID) 100 MCG tablet Take 1 tablet (100 mcg total) by mouth daily before breakfast.   simvastatin (ZOCOR) 40 MG tablet Take 40 mg by mouth at bedtime.    Immunization History  Administered Date(s) Administered   Influenza,inj,Quad PF,6+ Mos 09/14/2016, 04/13/2019, 04/16/2020   Influenza-Unspecified 09/14/2016, 04/13/2019, 04/16/2020, 04/02/2022    PFIZER(Purple Top)SARS-COV-2 Vaccination 10/20/2019, 11/29/2019   Tdap 04/16/2020        Objective:   BP (!) 140/70 (BP Location: Left Arm, Patient Position: Sitting, Cuff Size: Normal)   Pulse 95   Temp 98.1 F (36.7 C) (Oral)   Ht 5\' 11"  (1.803 m)   Wt 221 lb 12.8 oz (100.6 kg)   SpO2 97%   BMI 30.93 kg/m   SpO2: 97 % O2 Device: None (Room air)  GENERAL: Well-developed well-nourished gentleman, no tachypnea.  No conversational dyspnea.  Ambulatory with assistance of cane. Color looks good. HEAD: Normocephalic, atraumatic.  EYES: Pupils equal, round, reactive to light.  No scleral icterus.  MOUTH: Teeth intact, caps noted.  Oral mucosa moist, no thrush. NECK: Supple.  Limited ROM due to prior spinal fusion.  No thyromegaly. Trachea midline. No JVD.  No adenopathy. PULMONARY: Good air entry bilaterally.  No adventitious sounds. CARDIOVASCULAR: S1 and S2. Regular rate and rhythm.  No rubs, murmurs or gallops heard. ABDOMEN: Benign. MUSCULOSKELETAL: No joint deformity, no clubbing, no edema.  NEUROLOGIC: Mild psychomotor retardation noted, mild unsteady gait. SKIN: Intact,warm,dry. PSYCH: Mood and behavior normal.      Assessment & Plan:     ICD-10-CM  1. SOB (shortness of breath)  R06.02    At baseline, not improved by bronchodilators Suspect deconditioning    2. Squamous cell carcinoma of right lung (HCC)  C34.91    Stage IV disease On Durvalumab therapy Completed radiation to brain mets Follows with oncology    3. Pneumonitis  J98.4    Improved/resolved from prior CT Continue to monitor while on Durvalumab     Tim appears to be well compensated.  He is to continue as needed albuterol.  Will see him in follow-up in 4 months time he is to contact us prior to that time should any new difficulties arise.   Gailen Shelter, MD Advanced Bronchoscopy P[facility]    *This note was dictated using voice recognition software/Dragon.   Despite best efforts to proofread, errors can occur which can change the meaning. Any transcriptional errors that result from this process are unintentional and may not be fully corrected at the time of dictation.

## 2023-02-05 ENCOUNTER — Inpatient Hospital Stay: Payer: Medicare Other | Admitting: Oncology

## 2023-02-05 ENCOUNTER — Ambulatory Visit
Admission: RE | Admit: 2023-02-05 | Discharge: 2023-02-05 | Disposition: A | Discharge status: Home / Self Care | Payer: Medicare Other | Source: Ambulatory Visit | Attending: Internal Medicine | Admitting: Internal Medicine

## 2023-02-05 ENCOUNTER — Encounter: Payer: Self-pay | Admitting: Oncology

## 2023-02-05 ENCOUNTER — Inpatient Hospital Stay: Payer: Medicare Other

## 2023-02-05 ENCOUNTER — Ambulatory Visit: Payer: Medicare Other

## 2023-02-05 ENCOUNTER — Other Ambulatory Visit: Payer: Medicare Other

## 2023-02-05 ENCOUNTER — Ambulatory Visit: Payer: Medicare Other | Admitting: Oncology

## 2023-02-05 VITALS — BP 133/77 | HR 91 | Temp 98.8°F | Resp 18 | Wt 224.9 lb

## 2023-02-05 DIAGNOSIS — E876 Hypokalemia: Secondary | ICD-10-CM | POA: Insufficient documentation

## 2023-02-05 DIAGNOSIS — Z5112 Encounter for antineoplastic immunotherapy: Secondary | ICD-10-CM

## 2023-02-05 DIAGNOSIS — E032 Hypothyroidism due to medicaments and other exogenous substances: Secondary | ICD-10-CM | POA: Diagnosis not present

## 2023-02-05 DIAGNOSIS — C3491 Malignant neoplasm of unspecified part of right bronchus or lung: Secondary | ICD-10-CM

## 2023-02-05 DIAGNOSIS — C7931 Secondary malignant neoplasm of brain: Secondary | ICD-10-CM

## 2023-02-05 LAB — COMPREHENSIVE METABOLIC PANEL
ALT: 18 U/L (ref 0–44)
AST: 20 U/L (ref 15–41)
Albumin: 3.6 g/dL (ref 3.5–5.0)
Alkaline Phosphatase: 65 U/L (ref 38–126)
Anion gap: 10 (ref 5–15)
BUN: 11 mg/dL (ref 8–23)
CO2: 24 mmol/L (ref 22–32)
Calcium: 8.8 mg/dL — ABNORMAL LOW (ref 8.9–10.3)
Chloride: 108 mmol/L (ref 98–111)
Creatinine, Ser: 1.09 mg/dL (ref 0.61–1.24)
GFR, Estimated: >60 mL/min (ref >60)
Glucose, Bld: 133 mg/dL — ABNORMAL HIGH (ref 70–99)
Potassium: 3.2 mmol/L — ABNORMAL LOW (ref 3.5–5.1)
Sodium: 142 mmol/L (ref 135–145)
Total Bilirubin: 0.3 mg/dL (ref 0.3–1.2)
Total Protein: 6.3 g/dL — ABNORMAL LOW (ref 6.5–8.1)

## 2023-02-05 LAB — CBC WITH DIFFERENTIAL/PLATELET
Abs Immature Granulocytes: 0.01 10*3/uL (ref 0.00–0.07)
Basophils Absolute: 0.1 10*3/uL (ref 0.0–0.1)
Basophils Relative: 1 %
Eosinophils Absolute: 0.3 10*3/uL (ref 0.0–0.5)
Eosinophils Relative: 5 %
HCT: 37.7 % — ABNORMAL LOW (ref 39.0–52.0)
Hemoglobin: 12.3 g/dL — ABNORMAL LOW (ref 13.0–17.0)
Immature Granulocytes: 0 %
Lymphocytes Relative: 20 %
Lymphs Abs: 1.2 10*3/uL (ref 0.7–4.0)
MCH: 27.2 pg (ref 26.0–34.0)
MCHC: 32.6 g/dL (ref 30.0–36.0)
MCV: 83.2 fL (ref 80.0–100.0)
Monocytes Absolute: 0.8 10*3/uL (ref 0.1–1.0)
Monocytes Relative: 13 %
Neutro Abs: 3.5 10*3/uL (ref 1.7–7.7)
Neutrophils Relative %: 61 %
Platelets: 209 10*3/uL (ref 150–400)
RBC: 4.53 MIL/uL (ref 4.22–5.81)
RDW: 15.9 % — ABNORMAL HIGH (ref 11.5–15.5)
WBC: 5.8 10*3/uL (ref 4.0–10.5)
nRBC: 0 % (ref 0.0–0.2)

## 2023-02-05 LAB — TSH: TSH: 20.232 u[IU]/mL — ABNORMAL HIGH (ref 0.350–4.500)

## 2023-02-05 MED ORDER — POTASSIUM CHLORIDE CRYS ER 20 MEQ PO TBCR: 20.0000 meq | EXTENDED_RELEASE_TABLET | Freq: Every day | ORAL | 0 refills | Status: DC

## 2023-02-05 MED ORDER — LEVOTHYROXINE SODIUM 50 MCG PO TABS: 25.0000 ug | ORAL_TABLET | Freq: Every day | ORAL | 0 refills | Status: DC

## 2023-02-05 MED ORDER — SODIUM CHLORIDE 0.9% FLUSH
10.0000 mL | Freq: Once | INTRAVENOUS | Status: AC
  Administered 2023-02-05: 10 mL via INTRAVENOUS
  Filled 2023-02-05: qty 10

## 2023-02-05 MED ORDER — SODIUM CHLORIDE 0.9 % IV SOLN
10.0000 mg/kg | Freq: Once | INTRAVENOUS | Status: AC
  Administered 2023-02-05: 1000 mg via INTRAVENOUS
  Filled 2023-02-05: qty 20

## 2023-02-05 MED ORDER — GADOBUTROL 1 MMOL/ML IV SOLN
10.0000 mL | Freq: Once | INTRAVENOUS | Status: AC | PRN
  Administered 2023-02-05: 10 mL via INTRAVENOUS

## 2023-02-05 MED ORDER — HEPARIN SOD (PORK) LOCK FLUSH 100 UNIT/ML IV SOLN
500.0000 [IU] | Freq: Once | INTRAVENOUS | Status: AC | PRN
  Administered 2023-02-05: 500 [IU]
  Filled 2023-02-05: qty 5

## 2023-02-05 MED ORDER — SODIUM CHLORIDE 0.9 % IV SOLN
Freq: Once | INTRAVENOUS | Status: AC
  Filled 2023-02-05: qty 250

## 2023-02-05 NOTE — Assessment & Plan Note (Signed)
Status post radiation,  02/19/22 MRI showed decreased brain lesion size and edema.  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 Mitzi Hansen.  MRI on 02/05/23

## 2023-02-05 NOTE — Assessment & Plan Note (Addendum)
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  May 2024 CT scan showed Improving bilateral scattered interstitial and ground-glass opacities. Discussed with pulmonology Dr. Gonzalez. 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 Proceed with Durvalumab.   

## 2023-02-05 NOTE — Assessment & Plan Note (Signed)
Durvalumab treatment as planned.  

## 2023-02-05 NOTE — Progress Notes (Signed)
Pt here for follow up. Pt c/o of joint pain, especially shoulders.

## 2023-02-05 NOTE — Patient Instructions (Signed)

## 2023-02-05 NOTE — Assessment & Plan Note (Addendum)
TSH trends higher, recommend him to increase levothyroxine to 125 mcg daily

## 2023-02-05 NOTE — Progress Notes (Signed)
Hematology/Oncology Progress note [phone] [phone]    CHIEF COMPLAINTS/REASON FOR VISIT:  Follow up for lung cancer   ASSESSMENT & PLAN:   Cancer Staging  Squamous cell carcinoma of right lung (HCC) Staging form: Lung, AJCC 8th Edition - Clinical stage from 09/20/2020: Stage IV (cT1b, cN2, cM1) - Signed by [name], MD on 12/13/2021   Squamous cell carcinoma of right lung (HCC) Durvalumab was previously held due to pneumonitis.  May 2024 CT scan showed Improving bilateral scattered interstitial and ground-glass opacities. Discussed with pulmonology Dr. Jayme Cloud. 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 Proceed with Durvalumab.   Hypothyroidism due to medication TSH trends higher, recommend him to increase levothyroxine to 125 mcg daily    Encounter for antineoplastic immunotherapy Durvalumab treatment as planned.   Malignant neoplasm metastatic to brain [facility]) Status post radiation,  02/19/22 MRI showed decreased brain lesion size and edema.  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 Mitzi Hansen.  MRI on 02/05/23   Hypocalcemia Recommend patient to take Calcium 1200mg  daily.   Hypokalemia Recommend potassium KCL daily x 3 days   No orders of the defined types were placed in this encounter.   Follow up 2 weeks lab MD Durvalumab All questions were answered. The patient knows to call the clinic with any problems, questions or concerns.  [name], MD, PhD [facility] Hematology Oncology 02/05/2023       HISTORY OF PRESENTING ILLNESS:  Joshua [name] is a 67 y.o. male who has  above history reviewed by me today presents for follow up visit for Stage IV lung squamous cell carcinoma. Oncology History  Squamous cell carcinoma of right lung (HCC)  09/18/2020 Imaging   PET scan showed right upper lobe nodule maximal SUV 11.5.  Hypermetabolic cluster right paratracheal adenopathy with maximum SUV up to 12.  No findings of metastatic disease to the neck/abdomen/pelvis or skeleton.   09/20/2020 Initial Diagnosis   Squamous cell carcinoma of right lung (HCC) NGS showed PD-L1 TPS 95%, LRP1B S9101fs, PIK3CA E545K, RB1 c1422-2A>T, RIT1 T83_ A84del, STK11 P257fs, TPS53 R248W, TMB 19.49mut/mb, MS stable.    09/20/2020 Cancer Staging   Staging form: Lung, AJCC 8th Edition - Clinical stage from 09/20/2020: Stage IV (cT1b, cN2, cM1) - Signed by [name], MD on 12/13/2021   10/03/2020 Imaging   MRI brain with and without contrast showed no evidence of intracranial metastatic disease. Well-defined round lesion within the left parotid tail measuring approximately 2.3 x 1.8 x 1.8 cm   10/09/2020 - 11/27/2020 Radiation Therapy   concurrent chemoradiation   10/10/2020 - 10/31/2020 Chemotherapy   10/10/2020 concurrent chemoradiation.  carboplatin AUC of 2 and Taxol 45 mg/m2 x 4.  Additional chemotherapy was held due to thrombocytopenia, neutropenia       12/31/2020 - 03/21/2022 Chemotherapy   Maintenance  Durvalumab q14d      12/31/2020 -  Chemotherapy   Patient is on Treatment Plan : LUNG Durvalumab (10) q14d     03/28/2021 Imaging   CT without contrast showed new linear right perihilar consolidation with associated traction bronchiectasis.  Likely postradiation changes.  Additional new peribronchial vascular/nodular groundglass opacities with areas of consolidation or seen  primarily in the right upper and lower lobes, more distant from expected radiation fields. 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  Per my discussion with pulmonology Dr. 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     05/09/2021 Imaging   Repeat CT chest without contrast showed evolving radiation changes in the right perihilar region likely accounting for interval increase to thickening of the posterior wall of the right upper lobe bronchus.  The more peripheral right lung groundglass opacities seen on most recent study have generally improved with exception of one peripherally right upper lobe which appears new.  These are likely inflammatory.  No findings highly suspicious for local recurrence.   05/30/2021 - 01/10/2022 Chemotherapy   Durvalumab q14d     10/07/2021 Imaging   MRI brain with and without contrast showed 2 contrast-enhancing lesions, most consistent with metastatic disease.  Largest lesion is in the left frontal lobe and measures up to 1.3 cm with a large amount of surrounding edema and 9 mm rightward midline shift.  The other enhancing lesion was 4 mm in the right occipital lobe with a small amount of surrounding edema.  No acute or chronic hemorrhage.   10/07/2021 Progression   Headache after colonoscopy, progressive difficulty using her right arm.  Progressively worsening of speech. Stage IV lung cancer with brain metastasis.  started on Dexamethasone.  He was seen by neurosurgery and radiation oncology.  Recommendation is to proceed with SRS.  Patient was discharged with dexamethasone   10/16/2021 Imaging   PET scan showed postradiation changes in the right supra hilar lung without evidence of local recurrence.  No evidence of lung cancer recurrence or metastatic disease.  Single focus of intense metabolic activity associated with posterior left 11th rib likely secondary to recent fall/posttraumatic metabolic changes.  Frontal  uptake in the left frontal lobe corresponding to the known metastatic lesion in the brain   10/29/2021 -  Radiation Therapy   Brain Radiation  [facility]   12/10/2021 Imaging   Brain MRI: Continued interval decrease in size of a left frontal lobe metastasis, now measuring 10 mm. Moderate surrounding edema has also decreased.  3-4 mm metastasis within the right occipital lobe, not significantly changed in size. There is now a small focus of central non enhancement within this lesion. Increased edema. 3 mm enhancing metastasis within the high left parietal lobe. In retrospect, this was likely present as a subtle punctate focus of enhancement on the prior brain MRI of 10/22/2021.   01/18/2022 Imaging   CT chest abdomen pelvis w contrast 1. Stable post treatment changes in the right upper lobe medially with dense radiation fibrosis but no findings to suggest local recurrent tumor. 2. Significant progression of ill-defined ground-glass opacity, interstitial thickening and airspace nodularity in the right upper lobe and upper aspect of the right lower lobe. Findings could reflect an inflammatory or atypical infectious process, drug induced pneumonitis or interstitial spread of tumor.3. No mediastinal or hilar mass or adenopathy. 4. No findings for abdominal/pelvic metastatic disease or osseous metastatic disease.   02/19/2022 Imaging   MRI brain w wo contrast showed Three metastatic deposits in the brain appear smaller. Decreased edema in the left parietal lobe. No new lesions    04/04/2022 Imaging   CT chest w contrast 1. Stable radiation changes in the right perihilar region without evidence of local recurrence. 2. Interval improvement in the patchy ground-glass  opacities previously demonstrated in both lungs, most consistent with a resolving infectious or inflammatory process (including immunotherapy-related pneumonitis). 3. No evidence of metastatic disease.   05/14/2022 Miscellaneous   He had a mechanical fall and fractured his left elbow. xrays showed a left elbow fracture  05/26/22 seen by orthopedic surgeon, cast applied.    05/27/2022  Imaging   MRI brain w wo contrast 1. All three known brain metastases have enlarged since August, with increasing regional edema. And there is a small new left anterior cerebellar 4-5 mm metastasis (was questionably punctate on the May MRI). This constellation could reflect a combination of True Progression (Left Cerebellar lesion) and pseudoprogression/radionecrosis (3 existing mets).    2. No significant intracranial mass effect. 3. Underlying moderate cerebral white matter signal changes.   05/30/2022 Imaging   CT chest abdomen pelvis w contrast  1. 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 There is a stable small right pleural effusion, but no definitive findings to suggest residual or recurrent disease on today's examination. 2. No signs of metastatic disease in the abdomen or pelvis.  3. Aortic atherosclerosis, in addition to two-vessel coronary artery disease. Please note that although the presence of coronary artery calcium documents the presence of coronary artery disease, the severity of this disease and any potential stenosis cannot be assessed on this non-gated CT examination. Assessment for potential risk factor modification, dietary therapy or pharmacologic therapy may be warranted, if clinically indicated. 4. Hepatic steatosis. 5. Colonic diverticulosis without evidence of acute diverticulitis at this time. 6. Incompletely imaged but apparently occluded vascular graft in the superior aspect of the right anterior thigh. Referral to vascular specialist for further clinical evaluation should be considered if clinically appropriate. 7. Additional incidental findings, as above.   07/22/2022 Imaging   MRI brain  1. No new or enlarging lesions. No intracranial disease progression. 2. Decreased size of treated lesions of the left frontoparietal junction and left  postcentral gyrus. 3. Unchanged lesions in the left cerebellum and right occipital lobe.   10/15/2022 Imaging   MRI brain w wo contrast showed Interval increase in size of multiple intraparenchymal lesions with worsened surrounding vasogenic edema. 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 No new lesions   11/05/2022 Imaging   MRI brain w wo contrast  1. Likely slight increase and right occipital and left cerebellar lesions. Otherwise, unchanged metastases, as above. No new lesions identified. 2. Partially imaged large probable lymph node in the left upper neck, apparent on prior PET CT.   11/13/2022 -  Radiation Therapy   Each of the sites were treated with SRS/SBRT/SRT-IMRT technique to 20 Gy in 1 fraction to each of the following sites: PTV_2_LCerebel_72mm PTV_3_RCerebel_71mm PTV_4_ROccip_50mm PTV_5_LParietal_59mm   11/27/2022 Imaging   CT chest abdomen pelvis w contrast showed  Improving bilateral scattered interstitial and ground-glass opacities. Small amount of residual areas in the extreme left lower lobe inferiorly. This specific area is slightly increased from previous but overall the amount of opacities is significantly improved. Recommend continued follow-up. No developing new mass lesion, fluid collection or lymph node enlargement. Stable posttreatment changes right perihilar in the lung. Fatty liver infiltration. Colonic diverticulosis. Improved tiny right pleural effusion    Today he feels well.Intermittent headache, is better. denies any focal deficits. chronic SOB,not worse.  occasional cough. No new complaints.    Review of Systems  Constitutional:  Positive  for fatigue. Negative for appetite change, chills, fever and unexpected weight change.  HENT:   Negative for hearing loss and voice change.   Eyes:  Negative for eye problems and icterus.  Respiratory:  Negative for chest tightness, cough  and shortness of breath.   Cardiovascular:  Negative for chest pain and leg swelling.  Gastrointestinal:  Negative for abdominal distention and abdominal pain.  Endocrine: Negative for hot flashes.  Genitourinary:  Negative for difficulty urinating, dysuria and frequency.   Musculoskeletal:  Negative for arthralgias.  Skin:  Negative for itching and rash.  Neurological:  Positive for extremity weakness. Negative for headaches, light-headedness, numbness and speech difficulty.  Hematological:  Negative for adenopathy. Does not bruise/bleed easily.  Psychiatric/Behavioral:  Negative for confusion.       MEDICAL HISTORY:  Past Medical History:  Diagnosis Date   Allergy    Cancer (HCC)    lung   COPD (chronic obstructive pulmonary disease) (HCC)    Dyspnea    former smoker. only doe   History of kidney stones    Hypertension    Peripheral vascular disease (HCC)    Pre-diabetes    Sleep apnea     SURGICAL HISTORY: Past Surgical History:  Procedure Laterality Date   ANTERIOR CERVICAL DECOMP/DISCECTOMY FUSION N/A 03/14/2020   Procedure: ANTERIOR CERVICAL DECOMPRESSION/DISCECTOMY FUSION 3 LEVELS C4-7;  Surgeon: Venetia Night, MD;  Location: [facility] ORS;  Service: Neurosurgery;  Laterality: N/A;   BACK SURGERY  2011   CENTRAL LINE INSERTION Right 09/10/2016   Procedure: CENTRAL LINE INSERTION;  Surgeon: Renford Dills, MD;  Location: [facility] ORS;  Service: Vascular;  Laterality: Right;   CERVICAL FUSION     C4-c7 on Mar 14, 2020   COLONOSCOPY  2013 ?   COLONOSCOPY N/A 09/09/2021   Procedure: COLONOSCOPY;  Surgeon: Jaynie Collins, DO;  Location: [facility] ENDOSCOPY;  Service: Gastroenterology;  Laterality: N/A;   CORONARY ANGIOPLASTY     ESOPHAGOGASTRODUODENOSCOPY N/A 09/09/2021   Procedure: ESOPHAGOGASTRODUODENOSCOPY (EGD);  Surgeon: Jaynie Collins, DO;  Location: [facility] ENDOSCOPY;  Service: Gastroenterology;  Laterality: N/A;   FEMORAL-POPLITEAL BYPASS GRAFT Right 09/10/2016    Procedure: BYPASS GRAFT FEMORAL-POPLITEAL ARTERY ( BELOW KNEE );  Surgeon: Renford Dills, MD;  Location: [facility] ORS;  Service: Vascular;  Laterality: Right;   FEMORAL-TIBIAL BYPASS GRAFT Right 03/23/2020   Procedure: BYPASS GRAFT RIGHT FEMORAL- DISTAL TIBIAL ARTERY WITH DISTAL FLOW GRAFT;  Surgeon: Renford Dills, MD;  Location: [facility] ORS;  Service: Vascular;  Laterality: Right;   LEFT HEART CATH AND CORONARY ANGIOGRAPHY Left 05/16/2021   Procedure: LEFT HEART CATH AND CORONARY ANGIOGRAPHY;  Surgeon: Armando Reichert, MD;  Location: [facility] INVASIVE CV LAB;  Service: Cardiovascular;  Laterality: Left;   LOWER EXTREMITY ANGIOGRAPHY Right 11/18/2016   Procedure: Lower Extremity Angiography;  Surgeon: Renford Dills, MD;  Location: [facility] INVASIVE CV LAB;  Service: Cardiovascular;  Laterality: Right;   LOWER EXTREMITY ANGIOGRAPHY Right 12/09/2016   Procedure: Lower Extremity Angiography;  Surgeon: Renford Dills, MD;  Location: [facility] INVASIVE CV LAB;  Service: Cardiovascular;  Laterality: Right;   LOWER EXTREMITY ANGIOGRAPHY Right 11/15/2019   Procedure: LOWER EXTREMITY ANGIOGRAPHY;  Surgeon: Renford Dills, MD;  Location: [facility] INVASIVE CV LAB;  Service: Cardiovascular;  Laterality: Right;   LOWER EXTREMITY ANGIOGRAPHY Right 12/01/2019   Procedure: Lower Extremity Angiography;  Surgeon: Annice Needy, MD;  Location: [facility] INVASIVE CV LAB;  Service: Cardiovascular;  Laterality: Right;   LOWER EXTREMITY ANGIOGRAPHY Right 12/02/2019  Procedure: LOWER EXTREMITY ANGIOGRAPHY;  Surgeon: Renford Dills, MD;  Location: [facility] INVASIVE CV LAB;  Service: Cardiovascular;  Laterality: Right;   LOWER EXTREMITY ANGIOGRAPHY Right 03/23/2020   Procedure: Lower Extremity Angiography;  Surgeon: Renford Dills, MD;  Location: [facility] INVASIVE CV LAB;  Service: Cardiovascular;  Laterality: Right;   LOWER EXTREMITY ANGIOGRAPHY Right 09/25/2021   Procedure: Lower Extremity Angiography;  Surgeon: Renford Dills,  MD;  Location: [facility] INVASIVE CV LAB;  Service: Cardiovascular;  Laterality: Right;   LOWER EXTREMITY ANGIOGRAPHY Right 09/26/2021   Procedure: Lower Extremity Angiography;  Surgeon: Annice Needy, MD;  Location: [facility] INVASIVE CV LAB;  Service: Cardiovascular;  Laterality: Right;   LOWER EXTREMITY ANGIOGRAPHY Right 04/08/2022   Procedure: Lower Extremity Angiography;  Surgeon: Renford Dills, MD;  Location: [facility] INVASIVE CV LAB;  Service: Cardiovascular;  Laterality: Right;   LOWER EXTREMITY INTERVENTION  11/18/2016   Procedure: Lower Extremity Intervention;  Surgeon: Renford Dills, MD;  Location: [facility] INVASIVE CV LAB;  Service: Cardiovascular;;   PERIPHERAL VASCULAR CATHETERIZATION Right 01/02/2015   Procedure: Lower Extremity Angiography;  Surgeon: Renford Dills, MD;  Location: [facility] INVASIVE CV LAB;  Service: Cardiovascular;  Laterality: Right;   PERIPHERAL VASCULAR CATHETERIZATION Right 06/18/2015   Procedure: Lower Extremity Angiography;  Surgeon: Annice Needy, MD;  Location: [facility] INVASIVE CV LAB;  Service: Cardiovascular;  Laterality: Right;   PERIPHERAL VASCULAR CATHETERIZATION  06/18/2015   Procedure: Lower Extremity Intervention;  Surgeon: Annice Needy, MD;  Location: [facility] INVASIVE CV LAB;  Service: Cardiovascular;;   PERIPHERAL VASCULAR CATHETERIZATION N/A 10/09/2015   Procedure: Abdominal Aortogram w/Lower Extremity;  Surgeon: Renford Dills, MD;  Location: [facility] INVASIVE CV LAB;  Service: Cardiovascular;  Laterality: N/A;   PERIPHERAL VASCULAR CATHETERIZATION  10/09/2015   Procedure: Lower Extremity Intervention;  Surgeon: Renford Dills, MD;  Location: [facility] INVASIVE CV LAB;  Service: Cardiovascular;;   PERIPHERAL VASCULAR CATHETERIZATION Right 10/10/2015   Procedure: Lower Extremity Angiography;  Surgeon: Annice Needy, MD;  Location: [facility] INVASIVE CV LAB;  Service: Cardiovascular;  Laterality: Right;   PERIPHERAL VASCULAR CATHETERIZATION Right 10/10/2015   Procedure: Lower Extremity  Intervention;  Surgeon: Annice Needy, MD;  Location: [facility] INVASIVE CV LAB;  Service: Cardiovascular;  Laterality: Right;   PERIPHERAL VASCULAR CATHETERIZATION Right 12/03/2015   Procedure: Lower Extremity Angiography;  Surgeon: Annice Needy, MD;  Location: [facility] IN[facility] LAB;  Service: Cardiovascular;  Laterality: Right;   PERIPHERAL VASCULAR CATHETERIZATION Right 12/04/2015   Procedure: Lower Extremity Angiography;  Surgeon: Annice Needy, MD;  Location: [facility] INV[facility] CV LAB;  Service: Cardiovascular;  Laterality: Right;   PERIPHERAL VASCULAR CATHETERIZATION  12/04/2015   Procedure: Lower Extremity Intervention;  Surgeon: Annice Needy, MD;  Location: [facility] INVASIVE CV LAB;  Service: Cardiovascular;;   PERIPHERAL VASCULAR CATHETERIZATION Right 06/30/2016   Procedure: Lower Extremity Angiography;  Surgeon: Annice Needy, MD;  Location: [facility] INVASIVE CV LAB;  Service: Cardiovascular;  Laterality: Right;   PERIPHERAL VASCULAR CATHETERIZATION Right 06/25/2016   Procedure: Lower Extremity Angiography;  Surgeon: Renford Dills, MD;  Location: [facility] INVASIVE CV LAB;  Service: Cardiovascular;  Laterality: Right;   PERIPHERAL VASCULAR CATHETERIZATION Right 07/01/2016   Procedure: Lower Extremity Angiography;  Surgeon: Renford Dills, MD;  Location: [facility] INVASIVE CV LAB;  Service: Cardiovascular;  Laterality: Right;   PERIPHERAL VASCULAR CATHETERIZATION  07/01/2016   Procedure: Lower Extremity Intervention;  Surgeon: Renford Dills, MD;  Location: [facility] INVASIVE CV LAB;  Service: Cardiovascular;;  PERIPHERAL VASCULAR CATHETERIZATION Right 08/01/2016   Procedure: Lower Extremity Angiography;  Surgeon: Renford Dills, MD;  Location: [facility] INVASIVE CV LAB;  Service: Cardiovascular;  Laterality: Right;   PORTA CATH INSERTION N/A 10/05/2020   Procedure: PORTA CATH INSERTION;  Surgeon: Renford Dills, MD;  Location: [facility] INVASIVE CV LAB;  Service: Cardiovascular;  Laterality: N/A;   stent placement in right leg  Right    VIDEO BRONCHOSCOPY N/A 09/14/2020   Procedure: VIDEO BRONCHOSCOPY WITH FLUORO;  Surgeon: Salena Saner, MD;  Location: [facility] ORS;  Service: Cardiopulmonary;  Laterality: N/A;   VIDEO BRONCHOSCOPY WITH ENDOBRONCHIAL ULTRASOUND N/A 09/14/2020   Procedure: VIDEO BRONCHOSCOPY WITH ENDOBRONCHIAL ULTRASOUND;  Surgeon: Salena Saner, MD;  Location: [facility] ORS;  Service: Cardiopulmonary;  Laterality: N/A;    SOCIAL HISTORY: Social History   Socioeconomic History   Marital status: Single    Spouse name: Not on file   Number of children: Not on file   Years of education: Not on file   Highest education level: Not on file  Occupational History   Occupation: unemployed  Tobacco Use   Smoking status: Former    Current packs/day: 0.00    Average packs/day: 2.0 packs/day for 38.0 years (76.0 ttl pk-yrs)    Types: Cigarettes    Start date: 12/19/1976    Quit date: 12/20/2014    Years since quitting: 8.1   Smokeless tobacco: Never   Tobacco comments:    quit smoking in 2017 most smoked 2   Vaping Use   Vaping status: Former  Substance and Sexual Activity   Alcohol use: Not Currently   Drug use: No   Sexual activity: Yes  Other Topics Concern   Not on file  Social History Narrative   Not on file   Social Determinants of Health   Financial Resource Strain: Low Risk  (12/29/2022)   Received from [facility], [facility]   Overall Financial Resource Strain (CARDIA)    Difficulty of Paying Living Expenses: Not hard at all  Food Insecurity: No Food Insecurity (12/29/2022)   Received from [facility], [facility]   Hunger Vital Sign    Worried About Running Out of Food in the Last Year: Never true    Ran Out of Food in the Last Year: Never true  Transportation Needs: No Transportation Needs (12/29/2022)   Received from [facility], [facility]   [facility] - Transportation     In the past 12 months, has lack of transportation kept you from medical appointments or from getting medications?: No    Lack of Transportation (Non-Medical): No  Physical Activity: Not on file  Stress: Not on file  Social Connections: Not on file  Intimate Partner Violence: Not on file    FAMILY HISTORY: Family History  Problem Relation Age of Onset   Diabetes Mother    Hypertension Mother    Heart murmur Mother    Leukemia Mother    Throat cancer Maternal Grandmother    Colon cancer Neg Hx    Breast cancer Neg Hx     ALLERGIES:  has No Known Allergies.  MEDICATIONS:  Current Outpatient Medications  Medication Sig Dispense Refill   aspirin EC 81 MG tablet Take 81 mg by mouth daily. Swallow whole.     clopidogrel (PLAVIX) 75 MG tablet TAKE 1 TABLET BY MOUTH DAILY 100 tablet 2   levothyroxine (SYNTHROID) 100 MCG tablet Take 1 tablet (  100 mcg total) by mouth daily before breakfast. 90 tablet 0   levothyroxine (SYNTHROID) 50 MCG tablet Take 0.5 tablets (25 mcg total) by mouth daily before breakfast. Take with his levothyroxine. 30 tablet 0   potassium chloride SA (KLOR-CON M) 20 MEQ tablet Take 1 tablet (20 mEq total) by mouth daily. 3 tablet 0   simvastatin (ZOCOR) 40 MG tablet Take 40 mg by mouth at bedtime.     No current facility-administered medications for this visit.   Facility-Administered Medications Ordered in Other Visits  Medication Dose Route Frequency Provider Last Rate Last Admin   albuterol (PROVENTIL) (2.5 MG/3ML) 0.083% nebulizer solution 2.5 mg  2.5 mg Nebulization Once Salena Saner, MD       durvalumab (IMFINZI) 1,000 mg in sodium chloride 0.9 % 100 mL chemo infusion  10 mg/kg (Treatment Plan Recorded) Intravenous Once [name], MD         PHYSICAL EXAMINATION: ECOG PERFORMANCE STATUS: 1 - Symptomatic but completely ambulatory Vitals:   02/05/23 1339  BP: 133/77  Pulse: 91  Resp: 18  Temp: 98.8 F (37.1 C)  SpO2: 98%   Filed Weights    02/05/23 1339  Weight: 224 lb 14.4 oz (102 kg)    Physical Exam Constitutional:      General: He is not in acute distress.    Appearance: He is obese. He is not diaphoretic.     Comments: Patient walks with a cane  HENT:     Head: Normocephalic and atraumatic.  Eyes:     General: No scleral icterus. Cardiovascular:     Rate and Rhythm: Normal rate and regular rhythm.     Heart sounds: No murmur heard. Pulmonary:     Effort: Pulmonary effort is normal. No respiratory distress.  Abdominal:     General: There is no distension.     Palpations: Abdomen is soft.     Tenderness: There is no abdominal tenderness.  Musculoskeletal:     Cervical back: Normal range of motion and neck supple.  Skin:    General: Skin is warm and dry.     Findings: No erythema.  Neurological:     Mental Status: He is alert and oriented to person, place, and time. Mental status is at baseline.     Cranial Nerves: No cranial nerve deficit.     Motor: No abnormal muscle tone.  Psychiatric:        Mood and Affect: Mood and affect normal.      LABORATORY DATA:  I have reviewed the data as listed    Latest Ref Rng & Units 02/05/2023    1:05 PM 01/23/2023    8:35 AM 01/08/2023    8:01 AM  CBC  WBC 4.0 - 10.5 K/uL 5.8  5.5  5.6   Hemoglobin 13.0 - 17.0 g/dL 08.6  57.8  46.9   Hematocrit 39.0 - 52.0 % 37.7  40.3  38.7   Platelets 150 - 400 K/uL 209  232  221       Latest Ref Rng & Units 02/05/2023    1:05 PM 01/23/2023    8:35 AM 01/08/2023    8:01 AM  CMP  Glucose 70 - 99 mg/dL 629  528  413   BUN 8 - 23 mg/dL 11  11  17    Creatinine 0.61 - 1.24 mg/dL 2.44  0.10  2.72   Sodium 135 - 145 mmol/L 142  140  142   Potassium 3.5 - 5.1 mmol/L  3.2  3.8  3.4   Chloride 98 - 111 mmol/L 108  106  108   CO2 22 - 32 mmol/L 24  24  24    Calcium 8.9 - 10.3 mg/dL 8.8  9.0  9.1   Total Protein 6.5 - 8.1 g/dL 6.3  6.4  6.9   Total Bilirubin 0.3 - 1.2 mg/dL 0.3  0.2  0.5   Alkaline Phos 38 - 126 U/L 65  64  62    AST 15 - 41 U/L 20  32  25   ALT 0 - 44 U/L 18  27  20       RADIOGRAPHIC STUDIES: I have personally reviewed the radiological images as listed and agreed with the findings in the report. VAS [place] ABI WITH/WO TBI  Result Date: 01/08/2023  LOWER EXTREMITY DOPPLER STUDY Patient Name:  LABRON BLOODGOOD  Date of Exam:   01/05/2023 Medical Rec #: [id number]            Accession #:    [id number] Date of Birth: 10-05-1955           Patient Gender: M Patient Age:   4 years Exam Location:  [facility] Procedure:      VAS [place] ABI WITH/WO TBI Referring Phys: Sheppard Plumber --------------------------------------------------------------------------------  Indications: Claudication, peripheral artery disease, and Routine f/u. High Risk Factors: Hyperlipidemia. Other Factors: ASO s/p multiple interventions and bypass.                11/15/2019 Right thrombectomy of Fempop insitu graft with added                stent throughout.                 12/01/2019: Aortogram and Selctive Right Lower Extremity                Angiogram. 6 mg of TPA delivered in a lysis catheterfrom the                Origin of the Right Femoral -Popliteal bypass down the proximal                ATA with placement of the lysis.                 12/02/2019: PTA of the Right Posterior Tibial Artery. PTA of the                Right Peroneal Artery. Mechanical Thrombectomy of the Right                Femoral -Popliteal Bypass graft. Mechanical Thrombectomy of the                Right Tibioperoneal Trunk and Posterior Tibial Artery.                 09/25/2021: Right Lower Extremity Angiography third order                catheter placement. PTA of the proximal peroneal and                tibioperoneal trunk to 2.5 mm with an ultra score balloon. PTA of                the Femoral Artery and the proximal anstomosis of the bypass                graft to 4 mm with an ultra score balloon. Administration of 5  mg                of tpa throughout the  entire bypass and in the peroneal Artery.                Initiation of continuous tpa infusion Right lower Extremity for                limb salvage. Insertion of a triple lumen catheter Left CFV with                [place] and fluoroscopic guidance.                 09/26/2021: Right LE Angiogram. PTA of the Right Posterior Tibial                Artery with 2.5 mm diameter by 30 cm length angioplasty balloon.                PTA of the distal bypass anastomosis and distal bypass graft                analogous to the Popliteal artery with 4 mm diameter by 8 cm                length Lutonix drug coated angioplasty balloon.                04/08/2022 right thrombectomy of the Deep profunda.  Vascular Interventions: Hx of multiple right lower extremity PTA's/stents.                         09/10/2016 Right femoral to below the knee popliteal                         artery bypass graft placement. 5/01 Right bypass graft &                         mid popliteal artery PTA & coil embolectomy of large                         branch of bypass graft. 12/09/16 Right distal bypass                         graft PTA/stent and right posterior tibial artery PTA.                         03/23/2020 Rt PTA thrombectomy. Rt Profunda fem artery to                         PTA bypass. Comparison Study: 09/05/2022 Performing Technologist: Debbe Bales RVS  Examination Guidelines: A complete evaluation includes at minimum, Doppler waveform signals and systolic blood pressure reading at the level of bilateral brachial, anterior tibial, and posterior tibial arteries, when vessel segments are accessible. Bilateral testing is considered an integral part of a complete examination. Photoelectric Plethysmograph (PPG) waveforms and toe systolic pressure readings are included as required and additional duplex testing as needed. Limited examinations for reoccurring indications may be performed as noted.  ABI Findings:  +---------+------------------+-----+------------------+--------+ Right    Rt Pressure (mmHg)IndexWaveform          Comment  +---------+------------------+-----+------------------+--------+ Brachial 155                                               +---------+------------------+-----+------------------+--------+  ATA                             Non pulsatile flow         +---------+------------------+-----+------------------+--------+ PTA      69                0.45 monophasic                 +---------+------------------+-----+------------------+--------+ PERO     56                0.36 monophasic                 +---------+------------------+-----+------------------+--------+ Great Toe66                0.43 Abnormal                   +---------+------------------+-----+------------------+--------+ +---------+------------------+-----+---------+-------+ Left     Lt Pressure (mmHg)IndexWaveform Comment +---------+------------------+-----+---------+-------+ Brachial 155                                     +---------+------------------+-----+---------+-------+ ATA      158               1.02 triphasic        +---------+------------------+-----+---------+-------+ PTA      159               1.03 triphasic        +---------+------------------+-----+---------+-------+ Great Toe158               1.02 Normal           +---------+------------------+-----+---------+-------+ +-------+-----------+-----------+------------+------------+ ABI/TBIToday's ABIToday's TBIPrevious ABIPrevious TBI +-------+-----------+-----------+------------+------------+ Right  .45        .43        .63         .31          +-------+-----------+-----------+------------+------------+ Left   1.03       1.02       1.11        .91          +-------+-----------+-----------+------------+------------+ Bilateral TBIs appear increased compared to prior study on 09/05/2022. Left  ABIs appear essentially unchanged compared to prior study on 09/05/2022. The Right ABIs appear to be decreased compared to prior study on 09/05/2022.  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 The right toe-brachial index is abnormal. Imaged the Right ATA and Peroneal Artery at the level of the Ankle; Non pulsatile flow seen in the Right ATA and Monophasic flow seen in the Peroneal Artery. Left: Resting left ankle-brachial index is within normal range. The left toe-brachial index is normal. *See table(s) above for measurements and observations.   Electronically signed by Levora Dredge MD on 01/08/2023 at 3:23:11 PM.    Final    CT CHEST ABDOMEN PELVIS W CONTRAST  Result Date: 11/27/2022 CLINICAL DATA:  Right-sided lung cancer, squamous cell carcinoma. Staging. * Tracking Code: BO * EXAM: CT CHEST, ABDOMEN, AND PELVIS WITH CONTRAST TECHNIQUE: Multidetector CT imaging of the chest, abdomen and pelvis was performed following the standard protocol during bolus administration of intravenous contrast. RADIATION DOSE REDUCTION: This exam was performed according to the departmental dose-optimization program which includes automated exposure control, adjustment of the mA and/or kV according to patient size and/or use of iterative reconstruction technique. CONTRAST:   OMNIPAQUE IOHEXOL 300 MG/ML  SOLN COMPARISON:  CT 08/25/2022 and older FINDINGS: CT CHEST FINDINGS Cardiovascular: Right upper chest port. Heart is nonenlarged. No pericardial effusion. Slight pericardial thickening. The thoracic aorta has a normal course and caliber. Mediastinum/Nodes: Slightly patulous thoracic esophagus. Small thyroid gland. No specific abnormal lymph node enlargement seen in the axillary regions, hilum or mediastinum. Lungs/Pleura: Left lung is without consolidation, pneumothorax or effusion. Previously there was some patchy ground-glass in the left lung with some  reticular changes which has significantly improved. There is some subtle changes remaining in the left lower lobe dependently. The specific areas are increased from previous but overall the amount in the left lung is markedly improved. The right lung demonstrates some volume loss with thickening and bronchiectasis with some distortion right perihilar involving the right upper lobe which could be posttreatment changes. There are some changes as well there are similar and stable along the superior segment of the right lower lobe. No right-sided new consolidation, pneumothorax or effusion. No new dominant nodule. The previous reticulonodular changes in the right lung on the prior have also substantially improved. Musculoskeletal: Fixation hardware seen along the lower cervical spine at the edge of the imaging field. Scattered endplate osteophytes are seen throughout the thoracic spine with multilevel stenosis. Old left-sided lateral rib fractures inferiorly. CT ABDOMEN PELVIS FINDINGS Hepatobiliary: Mild fatty liver infiltration. The gallbladder is nondilated. Patent portal vein. No space-occupying liver lesion. Pancreas: Unremarkable. No pancreatic ductal dilatation or surrounding inflammatory changes. Spleen: Normal in size without focal abnormality. Adrenals/Urinary Tract: The adrenal glands are preserved. Mild bilateral renal atrophy. No enhancing renal mass or collecting system dilatation. The ureters have normal course and caliber down to the bladder. Preserved contours of the urinary bladder. Stomach/Bowel: On this non oral contrast exam, large bowel has a normal course and caliber. Scattered stool. Left-sided colonic diverticulosis. Normal appendix in the right lower quadrant. There is some debris in the stomach. Small bowel is nondilated. Vascular/Lymphatic: Aortic atherosclerosis. No enlarged abdominal or pelvic lymph nodes. There are areas of stenosis suggested along the internal iliac vessels. At the edge  of the imaging field are evidence of bypass grafts along the right thigh which appears occluded. SFA appears occluded. Please correlate with clinical history. Stable prominent right inguinal node on series 2, image 142. No retroperitoneal or other abdominal nodal enlargement. Reproductive: Prostate is unremarkable. Other: No abdominal wall hernia or abnormality. No abdominopelvic ascites. Musculoskeletal: Degenerative changes seen of the spine and pelvis. There is streak artifact related to the fixation hardware at the lumbosacral spine. IMPRESSION: Improving bilateral scattered interstitial and ground-glass opacities. Small amount of residual areas in the extreme left lower lobe inferiorly. This specific area is slightly increased from previous but overall the amount of opacities is significantly improved. Recommend continued follow-up. No developing new mass lesion, fluid collection or lymph node enlargement. Stable posttreatment changes right perihilar in the lung. Fatty liver infiltration. Colonic diverticulosis. Improved tiny right pleural effusion Electronically Signed   By: Karen Kays M.D.   On: 11/27/2022 16:15

## 2023-02-05 NOTE — Assessment & Plan Note (Signed)
Recommend potassium KCL daily x 3 days

## 2023-02-05 NOTE — Assessment & Plan Note (Signed)
Recommend patient to take Calcium 1200mg  daily.

## 2023-02-13 ENCOUNTER — Inpatient Hospital Stay: Payer: Medicare Other | Admitting: Internal Medicine

## 2023-02-13 ENCOUNTER — Other Ambulatory Visit: Payer: Self-pay

## 2023-02-13 ENCOUNTER — Encounter: Payer: Self-pay | Admitting: Internal Medicine

## 2023-02-13 VITALS — BP 158/79 | HR 99 | Temp 97.9°F | Resp 22 | Ht 71.0 in | Wt 220.0 lb

## 2023-02-13 DIAGNOSIS — C7931 Secondary malignant neoplasm of brain: Secondary | ICD-10-CM | POA: Diagnosis not present

## 2023-02-13 DIAGNOSIS — Z5112 Encounter for antineoplastic immunotherapy: Secondary | ICD-10-CM | POA: Diagnosis not present

## 2023-02-13 NOTE — Progress Notes (Signed)
[facility] [name]  [place], [place] [address] [phone]   Interval Evaluation  Date of Service: 02/13/23 Patient Name: Joshua [name] Patient MRN: [id number] Patient DOB: Dec 23, 1955 Provider: Henreitta Leber, MD  Identifying Statement:  Joshua [name] is a 67 y.o. male with Malignant neoplasm metastatic to brain [facility])    Primary Cancer:  Oncologic History: Oncology History  Squamous cell carcinoma of right lung (HCC)  09/18/2020 Imaging   PET scan showed right upper lobe nodule maximal SUV 11.5.  Hypermetabolic cluster right paratracheal adenopathy with maximum SUV up to 12.  No findings of metastatic disease to the neck/abdomen/pelvis or skeleton.   09/20/2020 Initial Diagnosis   Squamous cell carcinoma of right lung (HCC) NGS showed PD-L1 TPS 95%, LRP1B S928fs, PIK3CA E545K, RB1 c1422-2A>T, RIT1 T83_ A84del, STK11 P270fs, TPS53 R248W, TMB 19.33mut/mb, MS stable.    09/20/2020 Cancer Staging   Staging form: Lung, AJCC 8th Edition - Clinical stage from 09/20/2020: Stage IV (cT1b, cN2, cM1) - Signed by [name], MD on 12/13/2021   10/03/2020 Imaging   MRI brain with and without contrast showed no evidence of intracranial metastatic disease. Well-defined round lesion within the left parotid tail measuring approximately 2.3 x 1.8 x 1.8 cm   10/09/2020 - 11/27/2020 Radiation Therapy   concurrent chemoradiation   10/10/2020 - 10/31/2020 Chemotherapy   10/10/2020 concurrent chemoradiation.  carboplatin AUC of 2 and Taxol 45 mg/m2 x 4.  Additional chemotherapy was held due to thrombocytopenia, neutropenia       12/31/2020 - 03/21/2022 Chemotherapy   Maintenance  Durvalumab q14d      12/31/2020 -  Chemotherapy   Patient is on Treatment Plan : LUNG Durvalumab (10) q14d     03/28/2021 Imaging   CT without contrast showed new linear right perihilar consolidation with associated traction bronchiectasis.  Likely postradiation changes.   Additional new peribronchial vascular/nodular groundglass opacities with areas of consolidation or seen primarily in the right upper and lower lobes, more distant from expected radiation fields. 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  Per my discussion with pulmonology Dr. 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     05/09/2021 Imaging   Repeat CT chest without contrast showed evolving radiation changes in the right perihilar region likely accounting for interval increase to thickening of the posterior wall of the right upper lobe bronchus.  The more peripheral right lung groundglass opacities seen on most recent study have generally improved with exception of one peripherally right upper lobe which appears new.  These are likely inflammatory.  No findings highly suspicious for local recurrence.   05/30/2021 - 01/10/2022 Chemotherapy   Durvalumab q14d     10/07/2021 Imaging   MRI brain with and without contrast showed 2 contrast-enhancing lesions, most consistent with metastatic disease.  Largest lesion is in the left frontal lobe and measures up to 1.3 cm with a large amount of surrounding edema and 9 mm rightward midline shift.  The other enhancing lesion was 4 mm in the right occipital lobe with a small amount of surrounding edema.  No acute or chronic hemorrhage.   10/07/2021 Progression   Headache after colonoscopy, progressive difficulty using her right arm.  Progressively worsening of speech. Stage IV lung cancer with brain metastasis.  started on Dexamethasone.  He was seen by neurosurgery and radiation oncology.  Recommendation is  to proceed with SRS.  Patient was discharged with dexamethasone   10/16/2021 Imaging   PET scan showed postradiation changes in the right supra hilar lung without evidence of local recurrence.  No evidence of lung cancer recurrence  or metastatic disease.  Single focus of intense metabolic activity associated with posterior left 11th rib likely secondary to recent fall/posttraumatic metabolic changes.  Frontal  uptake in the left frontal lobe corresponding to the known metastatic lesion in the brain   10/29/2021 -  Radiation Therapy   Brain Radiation [facility]   12/10/2021 Imaging   Brain MRI: Continued interval decrease in size of a left frontal lobe metastasis, now measuring 10 mm. Moderate surrounding edema has also decreased.  3-4 mm metastasis within the right occipital lobe, not significantly changed in size. There is now a small focus of central non enhancement within this lesion. Increased edema. 3 mm enhancing metastasis within the high left parietal lobe. In retrospect, this was likely present as a subtle punctate focus of enhancement on the prior brain MRI of 10/22/2021.   01/18/2022 Imaging   CT chest abdomen pelvis w contrast 1. Stable post treatment changes in the right upper lobe medially with dense radiation fibrosis but no findings to suggest local recurrent tumor. 2. Significant progression of ill-defined ground-glass opacity, interstitial thickening and airspace nodularity in the right upper lobe and upper aspect of the right lower lobe. Findings could reflect an inflammatory or atypical infectious process, drug induced pneumonitis or interstitial spread of tumor.3. No mediastinal or hilar mass or adenopathy. 4. No findings for abdominal/pelvic metastatic disease or osseous metastatic disease.   02/19/2022 Imaging   MRI brain w wo contrast showed Three metastatic deposits in the brain appear smaller. Decreased edema in the left parietal lobe. No new lesions    04/04/2022 Imaging   CT chest w contrast 1. Stable radiation changes in the right perihilar region without evidence of local recurrence. 2. Interval improvement in the patchy ground-glass opacities previously demonstrated in both lungs, most consistent with a  resolving infectious or inflammatory process (including immunotherapy-related pneumonitis). 3. No evidence of metastatic disease.   05/14/2022 Miscellaneous   He had a mechanical fall and fractured his left elbow. xrays showed a left elbow fracture  05/26/22 seen by orthopedic surgeon, cast applied.    05/27/2022 Imaging   MRI brain w wo contrast 1. All three known brain metastases have enlarged since August, with increasing regional edema. And there is a small new left anterior cerebellar 4-5 mm metastasis (was questionably punctate on the May MRI). This constellation could reflect a combination of True Progression (Left Cerebellar lesion) and pseudoprogression/radionecrosis (3 existing mets).    2. No significant intracranial mass effect. 3. Underlying moderate cerebral white matter signal changes.   05/30/2022 Imaging   CT chest abdomen pelvis w contrast  1. 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 There is a stable small right pleural effusion, but no definitive findings to suggest residual or recurrent disease on today's examination. 2. No signs of metastatic disease in the abdomen or pelvis.  3. Aortic atherosclerosis, in addition to two-vessel coronary artery disease. Please note that although the presence of coronary artery calcium documents the presence of coronary artery disease, the severity of this disease and any potential stenosis cannot be assessed on this non-gated CT examination. Assessment for potential risk factor modification, dietary therapy or pharmacologic therapy may be warranted, if clinically indicated.  4. Hepatic steatosis. 5. Colonic diverticulosis without evidence of acute diverticulitis at this time. 6. Incompletely imaged but apparently occluded vascular graft in the superior aspect of the right anterior thigh. Referral to vascular specialist  for further clinical evaluation should be considered if clinically appropriate. 7. Additional incidental findings, as above.   07/22/2022 Imaging   MRI brain  1. No new or enlarging lesions. No intracranial disease progression. 2. Decreased size of treated lesions of the left frontoparietal junction and left postcentral gyrus. 3. Unchanged lesions in the left cerebellum and right occipital lobe.   10/15/2022 Imaging   MRI brain w wo contrast showed Interval increase in size of multiple intraparenchymal lesions with worsened surrounding vasogenic edema. 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 No new lesions   11/05/2022 Imaging   MRI brain w wo contrast  1. Likely slight increase and right occipital and left cerebellar lesions. Otherwise, unchanged metastases, as above. No new lesions identified. 2. Partially imaged large probable lymph node in the left upper neck, apparent on prior PET CT.   11/13/2022 -  Radiation Therapy   Each of the sites were treated with SRS/SBRT/SRT-IMRT technique to 20 Gy in 1 fraction to each of the following sites: PTV_2_LCerebel_20mm PTV_3_RCerebel_89mm PTV_4_ROccip_4mm PTV_5_LParietal_46mm   11/27/2022 Imaging   CT chest abdomen pelvis w contrast showed  Improving bilateral scattered interstitial and ground-glass opacities. Small amount of residual areas in the extreme left lower lobe inferiorly. This specific area is slightly increased from previous but overall the amount of opacities is significantly improved. Recommend continued follow-up. No developing new mass lesion, fluid collection or lymph node enlargement. Stable posttreatment changes right perihilar in the lung. Fatty liver infiltration. Colonic diverticulosis. Improved tiny right pleural effusion    CNS Oncologic History 10/29/21: SRS to left frontal metastasis ([place]) 11/13/22: salvage SRS x5 with Dr.  Mitzi Hansen  Interval History: Joshua [name] presents today for follow up after recent MRI brain.  No new or progressive changes, still has sporadic headaches.  Continues to ambulate with a cane as prior.  Back on immunotherapy with Dr. Cathie Hoops, no longer dosing decadron.  H+P (06/20/22) Patient presents today for evaluation for neurologic complaints.  He describes gradual onset of impaired speech output and right arm greater than right leg weakness.  This led to a fall and an injury to his left arm.  He was started on decadron two weeks ago, this has led to considerable improvement in symptoms.  Today he feels at his prior baseline.  No issues tolerating the steroids.  He is due for durvalumab infusion today with Dr. Cathie Hoops.   Medications: Current Outpatient Medications on File Prior to Visit  Medication Sig Dispense Refill   aspirin EC 81 MG tablet Take 81 mg by mouth daily. Swallow whole.     clopidogrel (PLAVIX) 75 MG tablet TAKE 1 TABLET BY MOUTH DAILY 100 tablet 2   levothyroxine (SYNTHROID) 100 MCG tablet Take 1 tablet (100 mcg total) by mouth daily before breakfast. 90 tablet 0   levothyroxine (SYNTHROID) 50 MCG tablet Take 0.5 tablets (25 mcg total) by mouth daily before breakfast. Take with his levothyroxine. 30 tablet 0   potassium chloride SA (KLOR-CON M) 20 MEQ tablet Take 1 tablet (20 mEq total) by mouth daily. 3 tablet 0   simvastatin (ZOCOR) 40 MG tablet Take 40 mg by mouth at bedtime.     Current Facility-Administered Medications on File Prior to Visit  Medication  Dose Route Frequency Provider Last Rate Last Admin   albuterol (PROVENTIL) (2.5 MG/3ML) 0.083% nebulizer solution 2.5 mg  2.5 mg Nebulization Once Salena Saner, MD        Allergies: No Known Allergies Past Medical History:  Past Medical History:  Diagnosis Date   Allergy    Cancer (HCC)    lung   COPD (chronic obstructive pulmonary disease) (HCC)    Dyspnea    former smoker. only doe   History of kidney  stones    Hypertension    Peripheral vascular disease (HCC)    Pre-diabetes    Sleep apnea    Past Surgical History:  Past Surgical History:  Procedure Laterality Date   ANTERIOR CERVICAL DECOMP/DISCECTOMY FUSION N/A 03/14/2020   Procedure: ANTERIOR CERVICAL DECOMPRESSION/DISCECTOMY FUSION 3 LEVELS C4-7;  Surgeon: Venetia Night, MD;  Location: [facility] ORS;  Service: Neurosurgery;  Laterality: N/A;   BACK SURGERY  2011   CENTRAL LINE INSERTION Right 09/10/2016   Procedure: CENTRAL LINE INSERTION;  Surgeon: Renford Dills, MD;  Location: [facility] ORS;  Service: Vascular;  Laterality: Right;   CERVICAL FUSION     C4-c7 on Mar 14, 2020   COLONOSCOPY  2013 ?   COLONOSCOPY N/A 09/09/2021   Procedure: COLONOSCOPY;  Surgeon: Jaynie Collins, DO;  Location: [facility] ENDOSCOPY;  Service: Gastroenterology;  Laterality: N/A;   CORONARY ANGIOPLASTY     ESOPHAGOGASTRODUODENOSCOPY N/A 09/09/2021   Procedure: ESOPHAGOGASTRODUODENOSCOPY (EGD);  Surgeon: Jaynie Collins, DO;  Location: [facility] ENDOSCOPY;  Service: Gastroenterology;  Laterality: N/A;   FEMORAL-POPLITEAL BYPASS GRAFT Right 09/10/2016   Procedure: BYPASS GRAFT FEMORAL-POPLITEAL ARTERY ( BELOW KNEE );  Surgeon: Renford Dills, MD;  Location: [facility] ORS;  Service: Vascular;  Laterality: Right;   FEMORAL-TIBIAL BYPASS GRAFT Right 03/23/2020   Procedure: BYPASS GRAFT RIGHT FEMORAL- DISTAL TIBIAL ARTERY WITH DISTAL FLOW GRAFT;  Surgeon: Renford Dills, MD;  Location: [facility] ORS;  Service: Vascular;  Laterality: Right;   LEFT HEART CATH AND CORONARY ANGIOGRAPHY Left 05/16/2021   Procedure: LEFT HEART CATH AND CORONARY ANGIOGRAPHY;  Surgeon: Armando Reichert, MD;  Location: [facility] INVASIVE CV LAB;  Service: Cardiovascular;  Laterality: Left;   LOWER EXTREMITY ANGIOGRAPHY Right 11/18/2016   Procedure: Lower Extremity Angiography;  Surgeon: Renford Dills, MD;  Location: [facility] INVASIVE CV LAB;  Service: Cardiovascular;  Laterality: Right;    LOWER EXTREMITY ANGIOGRAPHY Right 12/09/2016   Procedure: Lower Extremity Angiography;  Surgeon: Renford Dills, MD;  Location: [facility] INVASIVE CV LAB;  Service: Cardiovascular;  Laterality: Right;   LOWER EXTREMITY ANGIOGRAPHY Right 11/15/2019   Procedure: LOWER EXTREMITY ANGIOGRAPHY;  Surgeon: Renford Dills, MD;  Location: [facility] INVASIVE CV LAB;  Service: Cardiovascular;  Laterality: Right;   LOWER EXTREMITY ANGIOGRAPHY Right 12/01/2019   Procedure: Lower Extremity Angiography;  Surgeon: Annice Needy, MD;  Location: [facility] INVASIVE CV LAB;  Service: Cardiovascular;  Laterality: Right;   LOWER EXTREMITY ANGIOGRAPHY Right 12/02/2019   Procedure: LOWER EXTREMITY ANGIOGRAPHY;  Surgeon: Renford Dills, MD;  Location: [facility] INVASIVE CV LAB;  Service: Cardiovascular;  Laterality: Right;   LOWER EXTREMITY ANGIOGRAPHY Right 03/23/2020   Procedure: Lower Extremity Angiography;  Surgeon: Renford Dills, MD;  Location: [facility] INVASIVE CV LAB;  Service: Cardiovascular;  Laterality: Right;   LOWER EXTREMITY ANGIOGRAPHY Right 09/25/2021   Procedure: Lower Extremity Angiography;  Surgeon: Renford Dills, MD;  Location: [facility] INVASIVE CV LAB;  Service: Cardiovascular;  Laterality: Right;   LOWER EXTREMITY ANGIOGRAPHY Right 09/26/2021  Procedure: Lower Extremity Angiography;  Surgeon: Annice Needy, MD;  Location: [facility] INVASIVE CV LAB;  Service: Cardiovascular;  Laterality: Right;   LOWER EXTREMITY ANGIOGRAPHY Right 04/08/2022   Procedure: Lower Extremity Angiography;  Surgeon: Renford Dills, MD;  Location: [facility] INVASIVE CV LAB;  Service: Cardiovascular;  Laterality: Right;   LOWER EXTREMITY INTERVENTION  11/18/2016   Procedure: Lower Extremity Intervention;  Surgeon: Renford Dills, MD;  Location: [facility] INVASIVE CV LAB;  Service: Cardiovascular;;   PERIPHERAL VASCULAR CATHETERIZATION Right 01/02/2015   Procedure: Lower Extremity Angiography;  Surgeon: Renford Dills, MD;  Location: [facility] INVASIVE CV LAB;   Service: Cardiovascular;  Laterality: Right;   PERIPHERAL VASCULAR CATHETERIZATION Right 06/18/2015   Procedure: Lower Extremity Angiography;  Surgeon: Annice Needy, MD;  Location: [facility] INVASIVE CV LAB;  Service: Cardiovascular;  Laterality: Right;   PERIPHERAL VASCULAR CATHETERIZATION  06/18/2015   Procedure: Lower Extremity Intervention;  Surgeon: Annice Needy, MD;  Location: [facility] INVASIVE CV LAB;  Service: Cardiovascular;;   PERIPHERAL VASCULAR CATHETERIZATION N/A 10/09/2015   Procedure: Abdominal Aortogram w/Lower Extremity;  Surgeon: Renford Dills, MD;  Location: [facility] INVASIVE CV LAB;  Service: Cardiovascular;  Laterality: N/A;   PERIPHERAL VASCULAR CATHETERIZATION  10/09/2015   Procedure: Lower Extremity Intervention;  Surgeon: Renford Dills, MD;  Location: [facility] INVASIVE CV LAB;  Service: Cardiovascular;;   PERIPHERAL VASCULAR CATHETERIZATION Right 10/10/2015   Procedure: Lower Extremity Angiography;  Surgeon: Annice Needy, MD;  Location: [facility] INVASIVE CV LAB;  Service: Cardiovascular;  Laterality: Right;   PERIPHERAL VASCULAR CATHETERIZATION Right 10/10/2015   Procedure: Lower Extremity Intervention;  Surgeon: Annice Needy, MD;  Location: [facility] INVASIVE CV LAB;  Service: Cardiovascular;  Laterality: Right;   PERIPHERAL VASCULAR CATHETERIZATION Right 12/03/2015   Procedure: Lower Extremity Angiography;  Surgeon: Annice Needy, MD;  Location: [facility] INVAS[facility] LAB;  Service: Cardiovascular;  Laterality: Right;   PERIPHERAL VASCULAR CATHETERIZATION Right 12/04/2015   Procedure: Lower Extremity Angiography;  Surgeon: Annice Needy, MD;  Location: [facility] INVASIVE CV LAB;  Service: Cardiovascular;  Laterality: Right;   PERIPHERAL VASCULAR CATHETERIZATION  12/04/2015   Procedure: Lower Extremity Intervention;  Surgeon: Annice Needy, MD;  Location: [facility] INVASIVE CV LAB;  Service: Cardiovascular;;   PERIPHERAL VASCULAR CATHETERIZATION Right 06/30/2016   Procedure: Lower Extremity Angiography;  Surgeon: Annice Needy, MD;  Location: [facility] INVASIVE CV LAB;  Service: Cardiovascular;  Laterality: Right;   PERIPHERAL VASCULAR CATHETERIZATION Right 06/25/2016   Procedure: Lower Extremity Angiography;  Surgeon: Renford Dills, MD;  Location: [facility] INVAS[facility] LAB;  Service: Cardiovascular;  Laterality: Right;   PERIPHERAL VASCULAR CATHETERIZATION Right 07/01/2016   Procedure: Lower Extremity Angiography;  Surgeon: Renford Dills, MD;  Location: [facility] INVASIVE CV LAB;  Service: Cardiovascular;  Laterality: Right;   PERIPHERAL VASCULAR CATHETERIZATION  07/01/2016   Procedure: Lower Extremity Intervention;  Surgeon: Renford Dills, MD;  Location: [facility] INVASIVE CV LAB;  Service: Cardiovascular;;   PERIPHERAL VASCULAR CATHETERIZATION Right 08/01/2016   Procedure: Lower Extremity Angiography;  Surgeon: Renford Dills, MD;  Location: [facility] INVASIVE CV LAB;  Service: Cardiovascular;  Laterality: Right;   PORTA CATH INSERTION N/A 10/05/2020   Procedure: PORTA CATH INSERTION;  Surgeon: Renford Dills, MD;  Location: [facility] INVASIVE CV LAB;  Service: Cardiovascular;  Laterality: N/A;   stent placement in right leg Right    VIDEO BRONCHOSCOPY N/A 09/14/2020   Procedure: VIDEO BRONCHOSCOPY WITH FLUORO;  Surgeon: Salena Saner, MD;  Location: [facility] ORS;  Service: Cardiopulmonary;  Laterality: N/A;   VIDEO BRONCHOSCOPY WITH ENDOBRONCHIAL ULTRASOUND N/A 09/14/2020   Procedure: VIDEO BRONCHOSCOPY WITH ENDOBRONCHIAL ULTRASOUND;  Surgeon: Salena Saner, MD;  Location: [facility] ORS;  Service: Cardiopulmonary;  Laterality: N/A;   Social History:  Social History   Socioeconomic History   Marital status: Single    Spouse name: Not on file   Number of children: Not on file   Years of education: Not on file   Highest education level: Not on file  Occupational History   Occupation: unemployed  Tobacco Use   Smoking status: Former    Current packs/day: 0.00    Average packs/day: 2.0 packs/day for 38.0 years (76.0 ttl  pk-yrs)    Types: Cigarettes    Start date: 12/19/1976    Quit date: 12/20/2014    Years since quitting: 8.1   Smokeless tobacco: Never   Tobacco comments:    quit smoking in 2017 most smoked 2   Vaping Use   Vaping status: Former  Substance and Sexual Activity   Alcohol use: Not Currently   Drug use: No   Sexual activity: Yes  Other Topics Concern   Not on file  Social History Narrative   Not on file   Social Determinants of Health   Financial Resource Strain: Low Risk  (12/29/2022)   Received from [facility], [facility]   Overall Financial Resource Strain (CARDIA)    Difficulty of Paying Living Expenses: Not hard at all  Food Insecurity: No Food Insecurity (12/29/2022)   Received from [facility], [facility]   Hunger Vital Sign    Worried About Running Out of Food in the Last Year: Never true    Ran Out of Food in the Last Year: Never true  Transportation Needs: No Transportation Needs (12/29/2022)   [facility] from [facility], [facility]   [facility] - Transportation    In the past 12 months, has lack of transportation kept you from medical appointments or from getting medications?: No    Lack of Transportation (Non-Medical): No  Physical Activity: Not on file  Stress: Not on file  Social Connections: Not on file  Intimate Partner Violence: Not on file   Family History:  Family History  Problem Relation Age of Onset   Diabetes Mother    Hypertension Mother    Heart murmur Mother    Leukemia Mother    Throat cancer Maternal Grandmother    Colon cancer Neg Hx    Breast cancer Neg Hx     Review of Systems: Constitutional: Doesn't report fevers, chills or abnormal weight loss Eyes: Doesn't report blurriness of vision Ears, nose, mouth, throat, and face: Doesn't report sore throat Respiratory: Doesn't report cough, dyspnea or wheezes Cardiovascular: Doesn't  report palpitation, chest discomfort  Gastrointestinal:  Doesn't report nausea, constipation, diarrhea GU: Doesn't report incontinence Skin: Doesn't report skin rashes Neurological: Per HPI Musculoskeletal: Doesn't report joint pain Behavioral/Psych: Doesn't report anxiety  Physical Exam: Vitals:   02/13/23 1003  BP: (!) 158/79  Pulse: 99  Resp: (!) 22  Temp: 97.9 F (36.6 C)  SpO2: 98%    KPS: 80. General: Alert, cooperative, pleasant, in no acute distress Head: Normal EENT: No conjunctival injection or scleral icterus.  Lungs: Resp effort normal Cardiac: Regular rate Abdomen: Non-distended abdomen Skin: No rashes cyanosis or petechiae. Extremities: Left arm in sling  Neurologic Exam: Mental Status: Awake, alert, attentive to examiner. Oriented  to self and environment. Language is fluent with intact comprehension.  Cranial Nerves: Visual acuity is grossly normal. Visual fields are full. Extra-ocular movements intact. No ptosis. Face is symmetric Motor: Tone and bulk are normal. Power is full in both arms and legs. Reflexes are symmetric, no pathologic reflexes present.  Sensory: Intact to light touch Gait: Normal.   Labs: I have reviewed the data as listed    Component Value Date/Time   NA 142 02/05/2023 1305   NA 141 10/14/2019 1101   K 3.2 (L) 02/05/2023 1305   CL 108 02/05/2023 1305   CO2 24 02/05/2023 1305   GLUCOSE 133 (H) 02/05/2023 1305   BUN 11 02/05/2023 1305   BUN 15 10/14/2019 1101   BUN 16 07/03/2014 1205   CREATININE 1.09 02/05/2023 1305   CREATININE 1.11 11/06/2022 1041   CREATININE 1.34 (H) 07/03/2014 1205   CALCIUM 8.8 (L) 02/05/2023 1305   PROT 6.3 (L) 02/05/2023 1305   PROT 7.0 04/16/2020 1525   ALBUMIN 3.6 02/05/2023 1305   ALBUMIN 4.3 04/16/2020 1525   AST 20 02/05/2023 1305   ALT 18 02/05/2023 1305   ALKPHOS 65 02/05/2023 1305   BILITOT 0.3 02/05/2023 1305   BILITOT 0.4 04/16/2020 1525   GFRNONAA >60 02/05/2023 1305   GFRNONAA >60  11/06/2022 1041   GFRNONAA 59 (L) 07/03/2014 1205   GFRAA >60 03/30/2020 0536   GFRAA >60 07/03/2014 1205   Lab Results  Component Value Date   WBC 5.8 02/05/2023   NEUTROABS 3.5 02/05/2023   HGB 12.3 (L) 02/05/2023   HCT 37.7 (L) 02/05/2023   MCV 83.2 02/05/2023   PLT 209 02/05/2023    Imaging:  [facility] Clinician Interpretation: I have personally reviewed the CNS images as listed.  My interpretation, in the context of the patient's clinical presentation, is stable disease pending official read.  No results found.  Assessment/Plan Malignant neoplasm metastatic to brain [facility])  Joshua [name] is clinically stable today, now 2 months removed from salvage SRS to 5 additional metastatic foci.  MRI brain demonstrates stable findings, pending official read.   Recommended remaining off decadron if tolerated.    We appreciate the opportunity to participate in the care of [employer].   We ask that LLIAM MARELLA return to clinic in 3 months with brain MRI, or sooner as needed.  All questions were answered. The patient knows to call the clinic with any problems, questions or concerns. No barriers to learning were detected.  The total time spent in the encounter was 30 minutes and more than 50% was on counseling and review of test results   Henreitta Leber, MD Medical Director of Neuro-Oncology [facility] 02/13/23 9:59 AM

## 2023-02-18 ENCOUNTER — Other Ambulatory Visit: Payer: Self-pay | Admitting: Radiation Therapy

## 2023-02-19 ENCOUNTER — Inpatient Hospital Stay: Payer: Medicare Other | Attending: Oncology

## 2023-02-19 ENCOUNTER — Inpatient Hospital Stay (HOSPITAL_BASED_OUTPATIENT_CLINIC_OR_DEPARTMENT_OTHER): Payer: Medicare Other | Admitting: Oncology

## 2023-02-19 ENCOUNTER — Inpatient Hospital Stay: Payer: Medicare Other

## 2023-02-19 ENCOUNTER — Encounter: Payer: Self-pay | Admitting: Oncology

## 2023-02-19 VITALS — BP 124/70 | Temp 98.0°F | Resp 18 | Wt 217.9 lb

## 2023-02-19 VITALS — BP 118/66 | HR 75 | Resp 16

## 2023-02-19 DIAGNOSIS — C3491 Malignant neoplasm of unspecified part of right bronchus or lung: Secondary | ICD-10-CM

## 2023-02-19 DIAGNOSIS — E032 Hypothyroidism due to medicaments and other exogenous substances: Secondary | ICD-10-CM | POA: Insufficient documentation

## 2023-02-19 DIAGNOSIS — Z5112 Encounter for antineoplastic immunotherapy: Secondary | ICD-10-CM | POA: Diagnosis present

## 2023-02-19 DIAGNOSIS — J90 Pleural effusion, not elsewhere classified: Secondary | ICD-10-CM | POA: Insufficient documentation

## 2023-02-19 DIAGNOSIS — C3411 Malignant neoplasm of upper lobe, right bronchus or lung: Secondary | ICD-10-CM | POA: Insufficient documentation

## 2023-02-19 DIAGNOSIS — C7931 Secondary malignant neoplasm of brain: Secondary | ICD-10-CM

## 2023-02-19 LAB — CBC WITH DIFFERENTIAL/PLATELET
Abs Immature Granulocytes: 0.02 10*3/uL (ref 0.00–0.07)
Basophils Absolute: 0.1 10*3/uL (ref 0.0–0.1)
Basophils Relative: 1 %
Eosinophils Absolute: 0.3 10*3/uL (ref 0.0–0.5)
Eosinophils Relative: 4 %
HCT: 42.4 % (ref 39.0–52.0)
Hemoglobin: 13.7 g/dL (ref 13.0–17.0)
Immature Granulocytes: 0 %
Lymphocytes Relative: 12 %
Lymphs Abs: 0.9 10*3/uL (ref 0.7–4.0)
MCH: 27.1 pg (ref 26.0–34.0)
MCHC: 32.3 g/dL (ref 30.0–36.0)
MCV: 84 fL (ref 80.0–100.0)
Monocytes Absolute: 0.7 10*3/uL (ref 0.1–1.0)
Monocytes Relative: 9 %
Neutro Abs: 5.6 10*3/uL (ref 1.7–7.7)
Neutrophils Relative %: 74 %
Platelets: 254 10*3/uL (ref 150–400)
RBC: 5.05 MIL/uL (ref 4.22–5.81)
RDW: 15.7 % — ABNORMAL HIGH (ref 11.5–15.5)
WBC: 7.5 10*3/uL (ref 4.0–10.5)
nRBC: 0 % (ref 0.0–0.2)

## 2023-02-19 LAB — COMPREHENSIVE METABOLIC PANEL
ALT: 20 U/L (ref 0–44)
AST: 25 U/L (ref 15–41)
Albumin: 3.9 g/dL (ref 3.5–5.0)
Alkaline Phosphatase: 74 U/L (ref 38–126)
Anion gap: 9 (ref 5–15)
BUN: 13 mg/dL (ref 8–23)
CO2: 24 mmol/L (ref 22–32)
Calcium: 9.3 mg/dL (ref 8.9–10.3)
Chloride: 105 mmol/L (ref 98–111)
Creatinine, Ser: 1.09 mg/dL (ref 0.61–1.24)
GFR, Estimated: >60 mL/min (ref >60)
Glucose, Bld: 167 mg/dL — ABNORMAL HIGH (ref 70–99)
Potassium: 3.7 mmol/L (ref 3.5–5.1)
Sodium: 138 mmol/L (ref 135–145)
Total Bilirubin: 0.9 mg/dL (ref 0.3–1.2)
Total Protein: 7.4 g/dL (ref 6.5–8.1)

## 2023-02-19 LAB — TSH: TSH: 11.563 u[IU]/mL — ABNORMAL HIGH (ref 0.350–4.500)

## 2023-02-19 MED ORDER — SODIUM CHLORIDE 0.9 % IV SOLN
Freq: Once | INTRAVENOUS | Status: AC
  Filled 2023-02-19: qty 250

## 2023-02-19 MED ORDER — SODIUM CHLORIDE 0.9 % IV SOLN
10.0000 mg/kg | Freq: Once | INTRAVENOUS | Status: AC
  Administered 2023-02-19: 1000 mg via INTRAVENOUS
  Filled 2023-02-19: qty 20

## 2023-02-19 MED ORDER — HEPARIN SOD (PORK) LOCK FLUSH 100 UNIT/ML IV SOLN
500.0000 [IU] | Freq: Once | INTRAVENOUS | Status: AC | PRN
  Administered 2023-02-19: 500 [IU]
  Filled 2023-02-19: qty 5

## 2023-02-19 NOTE — Assessment & Plan Note (Signed)
Durvalumab treatment as planned.  

## 2023-02-19 NOTE — Assessment & Plan Note (Signed)
Recommend patient to take Calcium 1200mg  daily.

## 2023-02-19 NOTE — Patient Instructions (Signed)

## 2023-02-19 NOTE — Assessment & Plan Note (Signed)
Status post radiation,  02/19/22 MRI showed decreased brain lesion size and edema.  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 Mitzi Hansen.  MRI on 02/05/23 stable improved

## 2023-02-19 NOTE — Assessment & Plan Note (Addendum)
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  May 2024 CT scan showed Improving bilateral scattered interstitial and ground-glass opacities. Discussed with pulmonology Dr. Jayme Cloud. 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 Proceed with Durvalumab.  Repeat CT In August

## 2023-02-19 NOTE — Assessment & Plan Note (Signed)
TSH trends higher, recommend him to increase levothyroxine to 125 mcg daily

## 2023-02-19 NOTE — Progress Notes (Signed)
Hematology/Oncology Progress note [phone] [phone]    CHIEF COMPLAINTS/REASON FOR VISIT:  Follow up for lung cancer   ASSESSMENT & PLAN:   Cancer Staging  Squamous cell carcinoma of right lung (HCC) Staging form: Lung, AJCC 8th Edition - Clinical stage from 09/20/2020: Stage IV (cT1b, cN2, cM1) - Signed by [name], MD on 12/13/2021   Squamous cell carcinoma of right lung (HCC) Durvalumab was previously held due to pneumonitis.  May 2024 CT scan showed Improving bilateral scattered interstitial and ground-glass opacities. Discussed with pulmonology Dr. Jayme Cloud. 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 Proceed with Durvalumab.  Repeat CT In August  Hypothyroidism due to medication TSH trends higher, recommend him to increase levothyroxine to 125 mcg daily    Encounter for antineoplastic immunotherapy Durvalumab treatment as planned.   Malignant neoplasm metastatic to brain [facility]) Status post radiation,  02/19/22 MRI showed decreased brain lesion size and edema.  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 Mitzi Hansen.  MRI on 02/05/23 stable improved   Hypocalcemia Recommend patient to take Calcium 1200mg  daily.    Orders Placed This Encounter  Procedures   CT CHEST ABDOMEN PELVIS W CONTRAST    Standing Status:   Future    Standing Expiration Date:   02/19/2024    Order Specific Question:   If indicated for the ordered procedure, I authorize the administration of contrast media per Radiology protocol    Answer:   Yes    Order Specific Question:   Does the patient have a contrast media/X-ray dye  allergy?    Answer:   No    Order Specific Question:   Preferred imaging location?    Answer:   [facility]    Order Specific Question:   If indicated for the ordered procedure, I authorize the administration of oral contrast media per Radiology protocol    Answer:   Yes    Follow up 2 weeks lab MD Durvalumab All questions were answered. The patient knows to call the clinic with any problems, questions or concerns.  [name], MD, PhD [facility] Hematology Oncology 02/19/2023       HISTORY OF PRESENTING ILLNESS:  Joshua [name] is a 67 y.o. male who has above history reviewed by me today presents for follow up visit for Stage IV lung squamous cell carcinoma. Oncology History  Squamous cell carcinoma of right lung (HCC)  09/18/2020 Imaging   PET scan showed right upper lobe nodule maximal SUV 11.5.  Hypermetabolic cluster right paratracheal adenopathy with maximum SUV up to 12.  No findings of metastatic disease to the neck/abdomen/pelvis or skeleton.   09/20/2020 Initial Diagnosis   Squamous cell carcinoma of right lung (HCC) NGS showed PD-L1 TPS 95%, LRP1B S968fs, PIK3CA E545K, RB1 c1422-2A>T, RIT1 T83_ A84del, STK11 P251fs, TPS53 R248W, TMB 19.35mut/mb, MS stable.    09/20/2020 Cancer Staging   Staging form: Lung, AJCC 8th Edition - Clinical stage from 09/20/2020: Stage IV (cT1b, cN2, cM1) - Signed by [name], MD on 12/13/2021   10/03/2020 Imaging   MRI brain with and without contrast showed no evidence of intracranial metastatic disease. Well-defined round lesion within the left parotid tail measuring approximately  2.3 x 1.8 x 1.8 cm   10/09/2020 - 11/27/2020 Radiation Therapy   concurrent chemoradiation   10/10/2020 - 10/31/2020 Chemotherapy   10/10/2020 concurrent chemoradiation.  carboplatin AUC of 2 and Taxol 45 mg/m2 x 4.  Additional chemotherapy was held due to thrombocytopenia, neutropenia       12/31/2020 - 03/21/2022 Chemotherapy   Maintenance  Durvalumab q14d       12/31/2020 -  Chemotherapy   Patient is on Treatment Plan : LUNG Durvalumab (10) q14d     03/28/2021 Imaging   CT without contrast showed new linear right perihilar consolidation with associated traction bronchiectasis.  Likely postradiation changes.  Additional new peribronchial vascular/nodular groundglass opacities with areas of consolidation or seen primarily in the right upper and lower lobes, more distant from expected radiation fields. 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  Per my discussion with pulmonology Dr. 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     05/09/2021 Imaging   Repeat CT chest without contrast showed evolving radiation changes in the right perihilar region likely accounting for interval increase to thickening of the posterior wall of the right upper lobe bronchus.  The more peripheral right lung groundglass opacities seen on most recent study have generally improved with exception of one peripherally right upper lobe which appears new.  These are likely inflammatory.  No findings highly suspicious for local recurrence.   05/30/2021 - 01/10/2022 Chemotherapy   Durvalumab q14d     10/07/2021 Imaging   MRI brain with and without contrast showed 2 contrast-enhancing lesions, most consistent with metastatic disease.  Largest lesion is in the left frontal lobe and measures up to 1.3 cm with a large amount of surrounding edema and 9 mm rightward midline shift.  The other enhancing lesion was 4 mm in the right occipital lobe with a small amount of surrounding edema.  No acute or chronic hemorrhage.   10/07/2021 Progression   Headache after colonoscopy, progressive difficulty using her right arm.  Progressively worsening of speech. Stage IV lung cancer with brain metastasis.  started on Dexamethasone.  He was seen by neurosurgery and radiation  oncology.  Recommendation is to proceed with SRS.  Patient was discharged with dexamethasone   10/16/2021 Imaging   PET scan showed postradiation changes in the right supra hilar lung without evidence of local recurrence.  No evidence of lung cancer recurrence or metastatic disease.  Single focus of intense metabolic activity associated with posterior left 11th rib likely secondary to recent fall/posttraumatic metabolic changes.  Frontal  uptake in the left frontal lobe corresponding to the known metastatic lesion in the brain   10/29/2021 -  Radiation Therapy   Brain Radiation [facility]   12/10/2021 Imaging   Brain MRI: Continued interval decrease in size of a left frontal lobe metastasis, now measuring 10 mm. Moderate surrounding edema has also decreased.  3-4 mm metastasis within the right occipital lobe, not significantly changed in size. There is now a small focus of central non enhancement within this lesion. Increased edema. 3 mm enhancing metastasis within the high left parietal lobe. In retrospect, this was likely present as a subtle punctate focus of enhancement on the prior brain MRI of 10/22/2021.   01/18/2022 Imaging   CT chest abdomen pelvis w contrast 1. Stable post treatment changes in the right upper lobe medially with dense radiation fibrosis but no findings to suggest local recurrent  tumor. 2. Significant progression of ill-defined ground-glass opacity, interstitial thickening and airspace nodularity in the right upper lobe and upper aspect of the right lower lobe. Findings could reflect an inflammatory or atypical infectious process, drug induced pneumonitis or interstitial spread of tumor.3. No mediastinal or hilar mass or adenopathy. 4. No findings for abdominal/pelvic metastatic disease or osseous metastatic disease.   02/19/2022 Imaging   MRI brain w wo contrast showed Three metastatic deposits in the brain appear smaller. Decreased edema in the left parietal lobe. No new lesions     04/04/2022 Imaging   CT chest w contrast 1. Stable radiation changes in the right perihilar region without evidence of local recurrence. 2. Interval improvement in the patchy ground-glass opacities previously demonstrated in both lungs, most consistent with a resolving infectious or inflammatory process (including immunotherapy-related pneumonitis). 3. No evidence of metastatic disease.   05/14/2022 Miscellaneous   He had a mechanical fall and fractured his left elbow. xrays showed a left elbow fracture  05/26/22 seen by orthopedic surgeon, cast applied.    05/27/2022 Imaging   MRI brain w wo contrast 1. All three known brain metastases have enlarged since August, with increasing regional edema. And there is a small new left anterior cerebellar 4-5 mm metastasis (was questionably punctate on the May MRI). This constellation could reflect a combination of True Progression (Left Cerebellar lesion) and pseudoprogression/radionecrosis (3 existing mets).    2. No significant intracranial mass effect. 3. Underlying moderate cerebral white matter signal changes.   05/30/2022 Imaging   CT chest abdomen pelvis w contrast  1. 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 There is a stable small right pleural effusion, but no definitive findings to suggest residual or recurrent disease on today's examination. 2. No signs of metastatic disease in the abdomen or pelvis.  3. Aortic atherosclerosis, in addition to two-vessel coronary artery disease. Please note that although the presence of coronary artery calcium documents the presence of coronary artery disease, the severity of this disease and any potential stenosis cannot be assessed on this non-gated CT examination. Assessment for potential risk factor modification, dietary therapy or pharmacologic therapy may be warranted, if  clinically indicated. 4. Hepatic steatosis. 5. Colonic diverticulosis without evidence of acute diverticulitis at this time. 6. Incompletely imaged but apparently occluded vascular graft in the superior aspect of the right anterior thigh. Referral to vascular specialist for further clinical evaluation should be considered if clinically appropriate. 7. Additional incidental findings, as above.   07/22/2022 Imaging   MRI brain  1. No new or enlarging lesions. No intracranial disease progression. 2. Decreased size of treated lesions of the left frontoparietal junction and left postcentral gyrus. 3. Unchanged lesions in the left cerebellum and right occipital lobe.   10/15/2022 Imaging   MRI brain w wo contrast showed Interval increase in size of multiple intraparenchymal lesions with worsened surrounding vasogenic edema. 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 No new lesions   11/05/2022 Imaging   MRI brain w wo contrast  1. Likely slight increase and right occipital and left cerebellar lesions. Otherwise, unchanged metastases, as above. No new lesions identified. 2. Partially imaged large probable lymph node in the left upper neck, apparent on prior PET CT.   11/13/2022 -  Radiation Therapy   Each of the sites were treated with SRS/SBRT/SRT-IMRT technique to 20 Gy in 1 fraction to each  of the following sites: PTV_2_LCerebel_25mm PTV_3_RCerebel_67mm PTV_4_ROccip_23mm PTV_5_LParietal_84mm   11/27/2022 Imaging   CT chest abdomen pelvis w contrast showed  Improving bilateral scattered interstitial and ground-glass opacities. Small amount of residual areas in the extreme left lower lobe inferiorly. This specific area is slightly increased from previous but overall the amount of opacities is significantly improved. Recommend continued follow-up. No developing new mass lesion, fluid collection or lymph node enlargement.  Stable posttreatment changes right perihilar in the lung. Fatty liver infiltration. Colonic diverticulosis. Improved tiny right pleural effusion   02/05/2023 Imaging   MRI brain with and without contrast Interval decrease in all of the metastatic lesions, detailed above.No new lesions identified.    Today he feels well.Intermittent headache, is better. denies any focal deficits. chronic SOB,not worse.  occasional cough. No new complaints.    Review of Systems  Constitutional:  Positive for fatigue. Negative for appetite change, chills, fever and unexpected weight change.  HENT:   Negative for hearing loss and voice change.   Eyes:  Negative for eye problems and icterus.  Respiratory:  Negative for chest tightness, cough and shortness of breath.   Cardiovascular:  Negative for chest pain and leg swelling.  Gastrointestinal:  Negative for abdominal distention and abdominal pain.  Endocrine: Negative for hot flashes.  Genitourinary:  Negative for difficulty urinating, dysuria and frequency.   Musculoskeletal:  Negative for arthralgias.  Skin:  Negative for itching and rash.  Neurological:  Positive for extremity weakness. Negative for headaches, light-headedness, numbness and speech difficulty.  Hematological:  Negative for adenopathy. Does not bruise/bleed easily.  Psychiatric/Behavioral:  Negative for confusion.       MEDICAL HISTORY:  Past Medical History:  Diagnosis Date   Allergy    Cancer (HCC)    lung   COPD (chronic obstructive pulmonary disease) (HCC)    Dyspnea    former smoker. only doe   History of kidney stones    Hypertension    Peripheral vascular disease (HCC)    Pre-diabetes    Sleep apnea     SURGICAL HISTORY: Past Surgical History:  Procedure Laterality Date   ANTERIOR CERVICAL DECOMP/DISCECTOMY FUSION N/A 03/14/2020   Procedure: ANTERIOR CERVICAL DECOMPRESSION/DISCECTOMY FUSION 3 LEVELS C4-7;  Surgeon: Venetia Night, MD;  Location: [facility] ORS;   Service: Neurosurgery;  Laterality: N/A;   BACK SURGERY  2011   CENTRAL LINE INSERTION Right 09/10/2016   Procedure: CENTRAL LINE INSERTION;  Surgeon: Renford Dills, MD;  Location: [facility] ORS;  Service: Vascular;  Laterality: Right;   CERVICAL FUSION     C4-c7 on Mar 14, 2020   COLONOSCOPY  2013 ?   COLONOSCOPY N/A 09/09/2021   Procedure: COLONOSCOPY;  Surgeon: Jaynie Collins, DO;  Location: [facility] ENDOSCOPY;  Service: Gastroenterology;  Laterality: N/A;   CORONARY ANGIOPLASTY     ESOPHAGOGASTRODUODENOSCOPY N/A 09/09/2021   Procedure: ESOPHAGOGASTRODUODENOSCOPY (EGD);  Surgeon: Jaynie Collins, DO;  Location: [facility] ENDOSCOPY;  Service: Gastroenterology;  Laterality: N/A;   FEMORAL-POPLITEAL BYPASS GRAFT Right 09/10/2016   Procedure: BYPASS GRAFT FEMORAL-POPLITEAL ARTERY ( BELOW KNEE );  Surgeon: Renford Dills, MD;  Location: [facility] ORS;  Service: Vascular;  Laterality: Right;   FEMORAL-TIBIAL BYPASS GRAFT Right 03/23/2020   Procedure: BYPASS GRAFT RIGHT FEMORAL- DISTAL TIBIAL ARTERY WITH DISTAL FLOW GRAFT;  Surgeon: Renford Dills, MD;  Location: [facility] ORS;  Service: Vascular;  Laterality: Right;   LEFT HEART CATH AND CORONARY ANGIOGRAPHY Left 05/16/2021   Procedure: LEFT HEART CATH AND CORONARY ANGIOGRAPHY;  Surgeon: Beatrix Fetters,  Trixie Deis, MD;  Location: [facility] INVASIVE CV LAB;  Service: Cardiovascular;  Laterality: Left;   LOWER EXTREMITY ANGIOGRAPHY Right 11/18/2016   Procedure: Lower Extremity Angiography;  Surgeon: Renford Dills, MD;  Location: [facility] INVASIVE CV LAB;  Service: Cardiovascular;  Laterality: Right;   LOWER EXTREMITY ANGIOGRAPHY Right 12/09/2016   Procedure: Lower Extremity Angiography;  Surgeon: Renford Dills, MD;  Location: [facility] INVASIVE CV LAB;  Service: Cardiovascular;  Laterality: Right;   LOWER EXTREMITY ANGIOGRAPHY Right 11/15/2019   Procedure: LOWER EXTREMITY ANGIOGRAPHY;  Surgeon: Renford Dills, MD;  Location: [facility] INVASIVE CV LAB;  Service:  Cardiovascular;  Laterality: Right;   LOWER EXTREMITY ANGIOGRAPHY Right 12/01/2019   Procedure: Lower Extremity Angiography;  Surgeon: Annice Needy, MD;  Location: [facility] INVASIVE CV LAB;  Service: Cardiovascular;  Laterality: Right;   LOWER EXTREMITY ANGIOGRAPHY Right 12/02/2019   Procedure: LOWER EXTREMITY ANGIOGRAPHY;  Surgeon: Renford Dills, MD;  Location: [facility] INVASIVE CV LAB;  Service: Cardiovascular;  Laterality: Right;   LOWER EXTREMITY ANGIOGRAPHY Right 03/23/2020   Procedure: Lower Extremity Angiography;  Surgeon: Renford Dills, MD;  Location: [facility] INVASIVE CV LAB;  Service: Cardiovascular;  Laterality: Right;   LOWER EXTREMITY ANGIOGRAPHY Right 09/25/2021   Procedure: Lower Extremity Angiography;  Surgeon: Renford Dills, MD;  Location: [facility] INVASIVE CV LAB;  Service: Cardiovascular;  Laterality: Right;   LOWER EXTREMITY ANGIOGRAPHY Right 09/26/2021   Procedure: Lower Extremity Angiography;  Surgeon: Annice Needy, MD;  Location: [facility] INVASIVE CV LAB;  Service: Cardiovascular;  Laterality: Right;   LOWER EXTREMITY ANGIOGRAPHY Right 04/08/2022   Procedure: Lower Extremity Angiography;  Surgeon: Renford Dills, MD;  Location: [facility] INVASIVE CV LAB;  Service: Cardiovascular;  Laterality: Right;   LOWER EXTREMITY INTERVENTION  11/18/2016   Procedure: Lower Extremity Intervention;  Surgeon: Renford Dills, MD;  Location: [facility] INVASIVE CV LAB;  Service: Cardiovascular;;   PERIPHERAL VASCULAR CATHETERIZATION Right 01/02/2015   Procedure: Lower Extremity Angiography;  Surgeon: Renford Dills, MD;  Location: [facility] INVASIVE CV LAB;  Service: Cardiovascular;  Laterality: Right;   PERIPHERAL VASCULAR CATHETERIZATION Right 06/18/2015   Procedure: Lower Extremity Angiography;  Surgeon: Annice Needy, MD;  Location: [facility] INVASIVE CV LAB;  Service: Cardiovascular;  Laterality: Right;   PERIPHERAL VASCULAR CATHETERIZATION  06/18/2015   Procedure: Lower Extremity Intervention;  Surgeon: Annice Needy, MD;  Location: [facility] INVASIVE CV LAB;  Service: Cardiovascular;;   PERIPHERAL VASCULAR CATHETERIZATION N/A 10/09/2015   Procedure: Abdominal Aortogram w/Lower Extremity;  Surgeon: Renford Dills, MD;  Location: [facility] INVAS[facility] CV LAB;  Service: Cardiovascular;  Laterality: N/A;   PERIPHERAL VASCULAR CATHETERIZATION  10/09/2015   Procedure: Lower Extremity Intervention;  Surgeon: Renford Dills, MD;  Location: [facility] IN[facility] LAB;  Service: Cardiovascular;;   PERIPHERAL VASCULAR CATHETERIZATION Right 10/10/2015   Procedure: Lower Extremity Angiography;  Surgeon: Annice Needy, MD;  Location: [facility] IN[facility] LAB;  Service: Cardiovascular;  Laterality: Right;   PERIPHERAL VASCULAR CATHETERIZATION Right 10/10/2015   Procedure: Lower Extremity Intervention;  Surgeon: Annice Needy, MD;  Location: [facility] LAB;  Service: Cardiovascular;  Laterality: Right;   PERIPHERAL VASCULAR CATHETERIZATION Right 12/03/2015   Procedure: Lower Extremity Angiography;  Surgeon: Annice Needy, MD;  Location: [facility];  Service: Cardiovascular;  Laterality: Right;   PERIPHERAL VASCULAR CATHETERIZATION Right 12/04/2015   Procedure: Lower Extremity Angiography;  Surgeon: Annice Needy, MD;  Location: [facility];  Service: Cardiovascular;  Laterality: Right;  PERIPHERAL VASCULAR CATHETERIZATION  12/04/2015   Procedure: Lower Extremity Intervention;  Surgeon: Annice Needy, MD;  Location: [facility];  Service: Cardiovascular;;   PERIPHERAL VASCULAR CATHETERIZATION Right 06/30/2016   Procedure: Lower Extremity Angiography;  Surgeon: Annice Needy, MD;  Location: [facility] IN[facility] LAB;  Service: Cardiovascular;  Laterality: Right;   PERIPHERAL VASCULAR CATHETERIZATION Right 06/25/2016   Procedure: Lower Extremity Angiography;  Surgeon: Renford Dills, MD;  Location: [facility];  Service: Cardiovascular;  Laterality: Right;   PERIPHERAL VASCULAR CATHETERIZATION Right 07/01/2016    Procedure: Lower Extremity Angiography;  Surgeon: Renford Dills, MD;  Location: [facility] IN[facility] LAB;  Service: Cardiovascular;  Laterality: Right;   PERIPHERAL VASCULAR CATHETERIZATION  07/01/2016   Procedure: Lower Extremity Intervention;  Surgeon: Renford Dills, MD;  Location: [facility] IN[facility] LAB;  Service: Cardiovascular;;   PERIPHERAL VASCULAR CATHETERIZATION Right 08/01/2016   Procedure: Lower Extremity Angiography;  Surgeon: Renford Dills, MD;  Location: [facility] INVASIVE CV LAB;  Service: Cardiovascular;  Laterality: Right;   PORTA CATH INSERTION N/A 10/05/2020   Procedure: PORTA CATH INSERTION;  Surgeon: Renford Dills, MD;  Location: [facility] INVASIVE CV LAB;  Service: Cardiovascular;  Laterality: N/A;   stent placement in right leg Right    VIDEO BRONCHOSCOPY N/A 09/14/2020   Procedure: VIDEO BRONCHOSCOPY WITH FLUORO;  Surgeon: Salena Saner, MD;  Location: [facility] ORS;  Service: Cardiopulmonary;  Laterality: N/A;   VIDEO BRONCHOSCOPY WITH ENDOBRONCHIAL ULTRASOUND N/A 09/14/2020   Procedure: VIDEO BRONCHOSCOPY WITH ENDOBRONCHIAL ULTRASOUND;  Surgeon: Salena Saner, MD;  Location: [facility] ORS;  Service: Cardiopulmonary;  Laterality: N/A;    SOCIAL HISTORY: Social History   Socioeconomic History   Marital status: Single    Spouse name: Not on file   Number of children: Not on file   Years of education: Not on file   Highest education level: Not on file  Occupational History   Occupation: unemployed  Tobacco Use   Smoking status: Former    Current packs/day: 0.00    Average packs/day: 2.0 packs/day for 38.0 years (76.0 ttl pk-yrs)    Types: Cigarettes    Start date: 12/19/1976    Quit date: 12/20/2014    Years since quitting: 8.1   Smokeless tobacco: Never   Tobacco comments:    quit smoking in 2017 most smoked 2   Vaping Use   Vaping status: Former  Substance and Sexual Activity   Alcohol use: Not Currently   Drug use: No   Sexual activity: Yes  Other Topics  Concern   Not on file  Social History Narrative   Not on file   Social Determinants of Health   Financial Resource Strain: Low Risk  (12/29/2022)   Received from [facility], [facility]   Overall Financial Resource Strain (CARDIA)    Difficulty of Paying Living Expenses: Not hard at all  Food Insecurity: No Food Insecurity (12/29/2022)   Received from [facility], [facility]   Hunger Vital Sign    Worried About Running Out of Food in the Last Year: Never true    Ran Out of Food in the Last Year: Never true  Transportation Needs: No Transportation Needs (12/29/2022)   Received from [facility], [facility]   [facility] - Transportation    In the past 12 months, has lack of transportation kept you from medical appointments or from getting medications?: No  Lack of Transportation (Non-Medical): No  Physical Activity: Not on file  Stress: Not on file  Social Connections: Not on file  Intimate Partner Violence: Not on file    FAMILY HISTORY: Family History  Problem Relation Age of Onset   Diabetes Mother    Hypertension Mother    Heart murmur Mother    Leukemia Mother    Throat cancer Maternal Grandmother    Colon cancer Neg Hx    Breast cancer Neg Hx     ALLERGIES:  has No Known Allergies.  MEDICATIONS:  Current Outpatient Medications  Medication Sig Dispense Refill   aspirin EC 81 MG tablet Take 81 mg by mouth daily. Swallow whole.     clopidogrel (PLAVIX) 75 MG tablet TAKE 1 TABLET BY MOUTH DAILY 100 tablet 2   levothyroxine (SYNTHROID) 100 MCG tablet Take 1 tablet (100 mcg total) by mouth daily before breakfast. 90 tablet 0   levothyroxine (SYNTHROID) 50 MCG tablet Take 0.5 tablets (25 mcg total) by mouth daily before breakfast. Take with his levothyroxine. 30 tablet 0   simvastatin (ZOCOR) 40 MG tablet Take 40 mg by mouth at bedtime.     No current  facility-administered medications for this visit.   Facility-Administered Medications Ordered in Other Visits  Medication Dose Route Frequency Provider Last Rate Last Admin   albuterol (PROVENTIL) (2.5 MG/3ML) 0.083% nebulizer solution 2.5 mg  2.5 mg Nebulization Once Salena Saner, MD         PHYSICAL EXAMINATION: ECOG PERFORMANCE STATUS: 1 - Symptomatic but completely ambulatory Vitals:   02/19/23 0904  BP: 124/70  Resp: 18  Temp: 98 F (36.7 C)  SpO2: 100%   Filed Weights   02/19/23 0904  Weight: 217 lb 14.4 oz (98.8 kg)    Physical Exam Constitutional:      General: He is not in acute distress.    Appearance: He is obese. He is not diaphoretic.     Comments: Patient walks with a cane  HENT:     Head: Normocephalic and atraumatic.  Eyes:     General: No scleral icterus. Cardiovascular:     Rate and Rhythm: Normal rate and regular rhythm.     Heart sounds: No murmur heard. Pulmonary:     Effort: Pulmonary effort is normal. No respiratory distress.  Abdominal:     General: There is no distension.     Palpations: Abdomen is soft.     Tenderness: There is no abdominal tenderness.  Musculoskeletal:     Cervical back: Normal range of motion and neck supple.  Skin:    General: Skin is warm and dry.     Findings: No erythema.  Neurological:     Mental Status: He is alert and oriented to person, place, and time. Mental status is at baseline.     Cranial Nerves: No cranial nerve deficit.     Motor: No abnormal muscle tone.  Psychiatric:        Mood and Affect: Mood and affect normal.      LABORATORY DATA:  I have reviewed the data as listed    Latest Ref Rng & Units 02/19/2023    8:47 AM 02/05/2023    1:05 PM 01/23/2023    8:35 AM  CBC  WBC 4.0 - 10.5 K/uL 7.5  5.8  5.5   Hemoglobin 13.0 - 17.0 g/dL 57.8  46.9  62.9   Hematocrit 39.0 - 52.0 % 42.4  37.7  40.3   Platelets 150 -  400 K/uL 254  209  232       Latest Ref Rng & Units 02/19/2023    8:47 AM  02/05/2023    1:05 PM 01/23/2023    8:35 AM  CMP  Glucose 70 - 99 mg/dL 454  098  119   BUN 8 - 23 mg/dL 13  11  11    Creatinine 0.61 - 1.24 mg/dL 1.47  8.29  5.62   Sodium 135 - 145 mmol/L 138  142  140   Potassium 3.5 - 5.1 mmol/L 3.7  3.2  3.8   Chloride 98 - 111 mmol/L 105  108  106   CO2 22 - 32 mmol/L 24  24  24    Calcium 8.9 - 10.3 mg/dL 9.3  8.8  9.0   Total Protein 6.5 - 8.1 g/dL 7.4  6.3  6.4   Total Bilirubin 0.3 - 1.2 mg/dL 0.9  0.3  0.2   Alkaline Phos 38 - 126 U/L 74  65  64   AST 15 - 41 U/L 25  20  32   ALT 0 - 44 U/L 20  18  27       RADIOGRAPHIC STUDIES: I have personally reviewed the radiological images as listed and agreed with the findings in the report. MR BRAIN W WO CONTRAST  Result Date: 02/14/2023 CLINICAL DATA:  Brain/CNS neoplasm, assess treatment response. EXAM: MRI HEAD WITHOUT AND WITH CONTRAST TECHNIQUE: Multiplanar, multiecho pulse sequences of the brain and surrounding structures were obtained without and with intravenous contrast. CONTRAST:  10mL GADAVIST GADOBUTROL 1 MMOL/ML IV SOLN COMPARISON:  MRI head November 05, 2022. FINDINGS: Brain: Interval decrease in multiple enhancing lesions. Posterior left temporal lobe lesion now measures 18 mm (previously 21 mm). High left parietal lobe lesion now measures 11 mm (previously 14 mm). Right occipital lobe lesion measures 11 mm (previously 17 mm). Left cerebellar lesion is now punctate (previously 7 mm). No new enhancing lesions identified. Edema surrounding lesions is also decreased. No progressive mass effect. No midline shift. No evidence of acute infarct, mass occupying acute hemorrhage, or hydrocephalus. Vascular: Major arterial flow voids are maintained at the skull base. Skull and upper cervical spine: Normal marrow signal. Sinuses/Orbits: Clear sinuses.  No acute orbital findings. Other: No mastoid effusions. IMPRESSION: Interval decrease in all of the metastatic lesions, detailed above. No new lesions  identified. Electronically Signed   By: Feliberto Harts M.D.   On: 02/14/2023 14:52   VAS [place] ABI WITH/WO TBI  Result Date: 01/08/2023  LOWER EXTREMITY DOPPLER STUDY Patient Name:  Joshua [name]  Date of Exam:   01/05/2023 Medical Rec #: [id number]            Accession #:    [id number] Date of Birth: June 07, 1956           Patient Gender: M Patient Age:   69 years Exam Location:  [facility] Procedure:      VAS [place] ABI WITH/WO TBI Referring Phys: Sheppard Plumber --------------------------------------------------------------------------------  Indications: Claudication, peripheral artery disease, and Routine f/u. High Risk Factors: Hyperlipidemia. Other Factors: ASO s/p multiple interventions and bypass.                11/15/2019 Right thrombectomy of Fempop insitu graft with added                stent throughout.                 12/01/2019: Aortogram and Selctive Right Lower  Extremity                Angiogram. 6 mg of TPA delivered in a lysis catheterfrom the                Origin of the Right Femoral -Popliteal bypass down the proximal                ATA with placement of the lysis.                 12/02/2019: PTA of the Right Posterior Tibial Artery. PTA of the                Right Peroneal Artery. Mechanical Thrombectomy of the Right                Femoral -Popliteal Bypass graft. Mechanical Thrombectomy of the                Right Tibioperoneal Trunk and Posterior Tibial Artery.                 09/25/2021: Right Lower Extremity Angiography third order                catheter placement. PTA of the proximal peroneal and                tibioperoneal trunk to 2.5 mm with an ultra score balloon. PTA of                the Femoral Artery and the proximal anstomosis of the bypass                graft to 4 mm with an ultra score balloon. Administration of 5 mg                of tpa throughout the entire bypass and in the peroneal Artery.                Initiation of continuous tpa infusion Right lower  Extremity for                limb salvage. Insertion of a triple lumen catheter Left CFV with                [place] and fluoroscopic guidance.                 09/26/2021: Right LE Angiogram. PTA of the Right Posterior Tibial                Artery with 2.5 mm diameter by 30 cm length angioplasty balloon.                PTA of the distal bypass anastomosis and distal bypass graft                analogous to the Popliteal artery with 4 mm diameter by 8 cm                length Lutonix drug coated angioplasty balloon.                04/08/2022 right thrombectomy of the Deep profunda.  Vascular Interventions: Hx of multiple right lower extremity PTA's/stents.                         09/10/2016 Right femoral to below the knee popliteal  artery bypass graft placement. 5/01 Right bypass graft &                         mid popliteal artery PTA & coil embolectomy of large                         branch of bypass graft. 12/09/16 Right distal bypass                         graft PTA/stent and right posterior tibial artery PTA.                         03/23/2020 Rt PTA thrombectomy. Rt Profunda fem artery to                         PTA bypass. Comparison Study: 09/05/2022 Performing Technologist: Debbe Bales RVS  Examination Guidelines: A complete evaluation includes at minimum, Doppler waveform signals and systolic blood pressure reading at the level of bilateral brachial, anterior tibial, and posterior tibial arteries, when vessel segments are accessible. Bilateral testing is considered an integral part of a complete examination. Photoelectric Plethysmograph (PPG) waveforms and toe systolic pressure readings are included as required and additional duplex testing as needed. Limited examinations for reoccurring indications may be performed as noted.  ABI Findings: +---------+------------------+-----+------------------+--------+ Right    Rt Pressure (mmHg)IndexWaveform          Comment   +---------+------------------+-----+------------------+--------+ Brachial 155                                               +---------+------------------+-----+------------------+--------+ ATA                             Non pulsatile flow         +---------+------------------+-----+------------------+--------+ PTA      69                0.45 monophasic                 +---------+------------------+-----+------------------+--------+ PERO     56                0.36 monophasic                 +---------+------------------+-----+------------------+--------+ Great Toe66                0.43 Abnormal                   +---------+------------------+-----+------------------+--------+ +---------+------------------+-----+---------+-------+ Left     Lt Pressure (mmHg)IndexWaveform Comment +---------+------------------+-----+---------+-------+ Brachial 155                                     +---------+------------------+-----+---------+-------+ ATA      158               1.02 triphasic        +---------+------------------+-----+---------+-------+ PTA      159               1.03 triphasic        +---------+------------------+-----+---------+-------+ Lum Babe  1.02 Normal           +---------+------------------+-----+---------+-------+ +-------+-----------+-----------+------------+------------+ ABI/TBIToday's ABIToday's TBIPrevious ABIPrevious TBI +-------+-----------+-----------+------------+------------+ Right  .45        .43        .63         .31          +-------+-----------+-----------+------------+------------+ Left   1.03       1.02       1.11        .91          +-------+-----------+-----------+------------+------------+ Bilateral TBIs appear increased compared to prior study on 09/05/2022. Left ABIs appear essentially unchanged compared to prior study on 09/05/2022. The Right ABIs appear to be decreased compared to prior  study on 09/05/2022.  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 The right toe-brachial index is abnormal. Imaged the Right ATA and Peroneal Artery at the level of the Ankle; Non pulsatile flow seen in the Right ATA and Monophasic flow seen in the Peroneal Artery. Left: Resting left ankle-brachial index is within normal range. The left toe-brachial index is normal. *See table(s) above for measurements and observations.   Electronically signed by Levora Dredge MD on 01/08/2023 at 3:23:11 PM.    Final    CT CHEST ABDOMEN PELVIS W CONTRAST  Result Date: 11/27/2022 CLINICAL DATA:  Right-sided lung cancer, squamous cell carcinoma. Staging. * Tracking Code: BO * EXAM: CT CHEST, ABDOMEN, AND PELVIS WITH CONTRAST TECHNIQUE: Multidetector CT imaging of the chest, abdomen and pelvis was performed following the standard protocol during bolus administration of intravenous contrast. RADIATION DOSE REDUCTION: This exam was performed according to the departmental dose-optimization program which includes automated exposure control, adjustment of the mA and/or kV according to patient size and/or use of iterative reconstruction technique. CONTRAST:  OMNIPAQUE IOHEXOL 300 MG/ML  SOLN COMPARISON:  CT 08/25/2022 and older FINDINGS: CT CHEST FINDINGS Cardiovascular: Right upper chest port. Heart is nonenlarged. No pericardial effusion. Slight pericardial thickening. The thoracic aorta has a normal course and caliber. Mediastinum/Nodes: Slightly patulous thoracic esophagus. Small thyroid gland. No specific abnormal lymph node enlargement seen in the axillary regions, hilum or mediastinum. Lungs/Pleura: Left lung is without consolidation, pneumothorax or effusion. Previously there was some patchy ground-glass in the left lung with some reticular changes which has significantly improved. There is some subtle changes remaining in the left lower lobe dependently. The  specific areas are increased from previous but overall the amount in the left lung is markedly improved. The right lung demonstrates some volume loss with thickening and bronchiectasis with some distortion right perihilar involving the right upper lobe which could be posttreatment changes. There are some changes as well there are similar and stable along the superior segment of the right lower lobe. No right-sided new consolidation, pneumothorax or effusion. No new dominant nodule. The previous reticulonodular changes in the right lung on the prior have also substantially improved. Musculoskeletal: Fixation hardware seen along the lower cervical spine at the edge of the imaging field. Scattered endplate osteophytes are seen throughout the thoracic spine with multilevel stenosis. Old left-sided lateral rib fractures inferiorly. CT ABDOMEN PELVIS FINDINGS Hepatobiliary: Mild fatty liver infiltration. The gallbladder is nondilated. Patent portal vein. No space-occupying liver lesion. Pancreas: Unremarkable. No pancreatic ductal dilatation or surrounding inflammatory changes. Spleen: Normal in size without focal abnormality. Adrenals/Urinary Tract: The adrenal glands are preserved. Mild bilateral renal atrophy. No enhancing renal mass or collecting system dilatation. The ureters have normal course and caliber  down to the bladder. Preserved contours of the urinary bladder. Stomach/Bowel: On this non oral contrast exam, large bowel has a normal course and caliber. Scattered stool. Left-sided colonic diverticulosis. Normal appendix in the right lower quadrant. There is some debris in the stomach. Small bowel is nondilated. Vascular/Lymphatic: Aortic atherosclerosis. No enlarged abdominal or pelvic lymph nodes. There are areas of stenosis suggested along the internal iliac vessels. At the edge of the imaging field are evidence of bypass grafts along the right thigh which appears occluded. SFA appears occluded. Please  correlate with clinical history. Stable prominent right inguinal node on series 2, image 142. No retroperitoneal or other abdominal nodal enlargement. Reproductive: Prostate is unremarkable. Other: No abdominal wall hernia or abnormality. No abdominopelvic ascites. Musculoskeletal: Degenerative changes seen of the spine and pelvis. There is streak artifact related to the fixation hardware at the lumbosacral spine. IMPRESSION: Improving bilateral scattered interstitial and ground-glass opacities. Small amount of residual areas in the extreme left lower lobe inferiorly. This specific area is slightly increased from previous but overall the amount of opacities is significantly improved. Recommend continued follow-up. No developing new mass lesion, fluid collection or lymph node enlargement. Stable posttreatment changes right perihilar in the lung. Fatty liver infiltration. Colonic diverticulosis. Improved tiny right pleural effusion Electronically Signed   By: Karen Kays M.D.   On: 11/27/2022 16:15

## 2023-02-27 ENCOUNTER — Ambulatory Visit: Payer: Medicare Other

## 2023-03-05 ENCOUNTER — Inpatient Hospital Stay (HOSPITAL_BASED_OUTPATIENT_CLINIC_OR_DEPARTMENT_OTHER): Payer: Medicare Other | Admitting: Oncology

## 2023-03-05 ENCOUNTER — Encounter: Payer: Self-pay | Admitting: Oncology

## 2023-03-05 ENCOUNTER — Inpatient Hospital Stay: Payer: Medicare Other

## 2023-03-05 VITALS — BP 127/64 | HR 81 | Temp 97.9°F | Resp 19

## 2023-03-05 VITALS — BP 124/73 | HR 95 | Temp 97.2°F | Resp 18 | Wt 217.8 lb

## 2023-03-05 DIAGNOSIS — E032 Hypothyroidism due to medicaments and other exogenous substances: Secondary | ICD-10-CM | POA: Diagnosis not present

## 2023-03-05 DIAGNOSIS — C3491 Malignant neoplasm of unspecified part of right bronchus or lung: Secondary | ICD-10-CM

## 2023-03-05 DIAGNOSIS — C7931 Secondary malignant neoplasm of brain: Secondary | ICD-10-CM

## 2023-03-05 DIAGNOSIS — Z5112 Encounter for antineoplastic immunotherapy: Secondary | ICD-10-CM

## 2023-03-05 LAB — CBC WITH DIFFERENTIAL/PLATELET
Abs Immature Granulocytes: 0.01 10*3/uL (ref 0.00–0.07)
Basophils Absolute: 0.1 10*3/uL (ref 0.0–0.1)
Basophils Relative: 1 %
Eosinophils Absolute: 0.3 10*3/uL (ref 0.0–0.5)
Eosinophils Relative: 6 %
HCT: 41.4 % (ref 39.0–52.0)
Hemoglobin: 13.1 g/dL (ref 13.0–17.0)
Immature Granulocytes: 0 %
Lymphocytes Relative: 16 %
Lymphs Abs: 0.8 10*3/uL (ref 0.7–4.0)
MCH: 27.1 pg (ref 26.0–34.0)
MCHC: 31.6 g/dL (ref 30.0–36.0)
MCV: 85.5 fL (ref 80.0–100.0)
Monocytes Absolute: 0.5 10*3/uL (ref 0.1–1.0)
Monocytes Relative: 10 %
Neutro Abs: 3.6 10*3/uL (ref 1.7–7.7)
Neutrophils Relative %: 67 %
Platelets: 233 10*3/uL (ref 150–400)
RBC: 4.84 MIL/uL (ref 4.22–5.81)
RDW: 15.1 % (ref 11.5–15.5)
WBC: 5.3 10*3/uL (ref 4.0–10.5)
nRBC: 0 % (ref 0.0–0.2)

## 2023-03-05 LAB — COMPREHENSIVE METABOLIC PANEL
ALT: 20 U/L (ref 0–44)
AST: 24 U/L (ref 15–41)
Albumin: 3.8 g/dL (ref 3.5–5.0)
Alkaline Phosphatase: 71 U/L (ref 38–126)
Anion gap: 8 (ref 5–15)
BUN: 15 mg/dL (ref 8–23)
CO2: 24 mmol/L (ref 22–32)
Calcium: 8.9 mg/dL (ref 8.9–10.3)
Chloride: 107 mmol/L (ref 98–111)
Creatinine, Ser: 1.11 mg/dL (ref 0.61–1.24)
GFR, Estimated: >60 mL/min (ref >60)
Glucose, Bld: 164 mg/dL — ABNORMAL HIGH (ref 70–99)
Potassium: 3.9 mmol/L (ref 3.5–5.1)
Sodium: 139 mmol/L (ref 135–145)
Total Bilirubin: 0.5 mg/dL (ref 0.3–1.2)
Total Protein: 6.7 g/dL (ref 6.5–8.1)

## 2023-03-05 LAB — TSH: TSH: 8.695 u[IU]/mL — ABNORMAL HIGH (ref 0.350–4.500)

## 2023-03-05 MED ORDER — SODIUM CHLORIDE 0.9 % IV SOLN
Freq: Once | INTRAVENOUS | Status: AC
  Filled 2023-03-05: qty 250

## 2023-03-05 MED ORDER — SODIUM CHLORIDE 0.9 % IV SOLN
10.0000 mg/kg | Freq: Once | INTRAVENOUS | Status: AC
  Administered 2023-03-05: 1000 mg via INTRAVENOUS
  Filled 2023-03-05: qty 20

## 2023-03-05 MED ORDER — HEPARIN SOD (PORK) LOCK FLUSH 100 UNIT/ML IV SOLN
500.0000 [IU] | Freq: Once | INTRAVENOUS | Status: AC | PRN
  Administered 2023-03-05: 500 [IU]
  Filled 2023-03-05: qty 5

## 2023-03-05 NOTE — Progress Notes (Signed)
Hematology/Oncology Progress note [phone] [phone]    CHIEF COMPLAINTS/REASON FOR VISIT:  Follow up for lung cancer   ASSESSMENT & PLAN:   Cancer Staging  Squamous cell carcinoma of right lung (HCC) Staging form: Lung, AJCC 8th Edition - Clinical stage from 09/20/2020: Stage IV (cT1b, cN2, cM1) - Signed by [name], MD on 12/13/2021   Squamous cell carcinoma of right lung (HCC) Durvalumab was previously held due to pneumonitis.  May 2024 CT scan showed Improving bilateral scattered interstitial and ground-glass opacities. Discussed with pulmonology Dr. Jayme Cloud. 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 Proceed with Durvalumab.  Repeat CT In August  Hypothyroidism due to medication continue levothyroxine to 125 mcg daily    Encounter for antineoplastic immunotherapy Durvalumab treatment as planned.   Malignant neoplasm metastatic to brain [facility]) Status post radiation,  02/19/22 MRI showed decreased brain lesion size and edema.  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 Mitzi Hansen.  MRI on 02/05/23 stable improved    Orders Placed This Encounter  Procedures   T4    Standing Status:   Future    Standing Expiration Date:   04/01/2024   TSH    Standing Status:   Future    Standing Expiration Date:   04/01/2024   CBC with Differential    Standing Status:   Future    Standing Expiration Date:   04/01/2024   Comprehensive metabolic panel    Standing Status:   Future    Standing Expiration Date:   04/01/2024    Follow up 2 weeks lab MD Durvalumab All questions were answered. The patient knows to  call the clinic with any problems, questions or concerns.  [name], MD, PhD [facility] Hematology Oncology 03/05/2023       HISTORY OF PRESENTING ILLNESS:  JESUA MCHAN is a 67 y.o. male who has above history reviewed by me today presents for follow up visit for Stage IV lung squamous cell carcinoma. Oncology History  Squamous cell carcinoma of right lung (HCC)  09/18/2020 Imaging   PET scan showed right upper lobe nodule maximal SUV 11.5.  Hypermetabolic cluster right paratracheal adenopathy with maximum SUV up to 12.  No findings of metastatic disease to the neck/abdomen/pelvis or skeleton.   09/20/2020 Initial Diagnosis   Squamous cell carcinoma of right lung (HCC) NGS showed PD-L1 TPS 95%, LRP1B S930fs, PIK3CA E545K, RB1 c1422-2A>T, RIT1 T83_ A84del, STK11 P240fs, TPS53 R248W, TMB 19.76mut/mb, MS stable.    09/20/2020 Cancer Staging   Staging form: Lung, AJCC 8th Edition - Clinical stage from 09/20/2020: Stage IV (cT1b, cN2, cM1) - Signed by [name], MD on 12/13/2021   10/03/2020 Imaging   MRI brain with and without contrast showed no evidence of intracranial metastatic disease. Well-defined round lesion within the left parotid tail measuring approximately 2.3 x 1.8 x 1.8 cm   10/09/2020 - 11/27/2020 Radiation Therapy   concurrent chemoradiation   10/10/2020 - 10/31/2020 Chemotherapy   10/10/2020 concurrent chemoradiation.  carboplatin AUC of 2 and Taxol 45 mg/m2 x 4.  Additional chemotherapy was held due to thrombocytopenia, neutropenia       12/31/2020 - 03/21/2022 Chemotherapy   Maintenance  Durvalumab  q14d      12/31/2020 -  Chemotherapy   Patient is on Treatment Plan : LUNG Durvalumab (10) q14d     03/28/2021 Imaging   CT without contrast showed new linear right perihilar consolidation with associated traction bronchiectasis.  Likely postradiation changes.  Additional new peribronchial vascular/nodular groundglass opacities with areas of consolidation or seen primarily in  the right upper and lower lobes, more distant from expected radiation fields. 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  Per my discussion with pulmonology Dr. 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     05/09/2021 Imaging   Repeat CT chest without contrast showed evolving radiation changes in the right perihilar region likely accounting for interval increase to thickening of the posterior wall of the right upper lobe bronchus.  The more peripheral right lung groundglass opacities seen on most recent study have generally improved with exception of one peripherally right upper lobe which appears new.  These are likely inflammatory.  No findings highly suspicious for local recurrence.   05/30/2021 - 01/10/2022 Chemotherapy   Durvalumab q14d     10/07/2021 Imaging   MRI brain with and without contrast showed 2 contrast-enhancing lesions, most consistent with metastatic disease.  Largest lesion is in the left frontal lobe and measures up to 1.3 cm with a large amount of surrounding edema and 9 mm rightward midline shift.  The other enhancing lesion was 4 mm in the right occipital lobe with a small amount of surrounding edema.  No acute or chronic hemorrhage.   10/07/2021 Progression   Headache after colonoscopy, progressive difficulty using her right arm.  Progressively worsening of speech. Stage IV lung cancer with brain metastasis.  started on Dexamethasone.  He was seen by neurosurgery and radiation oncology.  Recommendation is to proceed with SRS.  Patient was discharged with dexamethasone   10/16/2021 Imaging   PET scan showed postradiation changes in the right supra hilar lung without evidence of local recurrence.  No evidence of lung cancer recurrence or metastatic disease.  Single focus of intense metabolic activity associated with posterior left 11th rib likely  secondary to recent fall/posttraumatic metabolic changes.  Frontal  uptake in the left frontal lobe corresponding to the known metastatic lesion in the brain   10/29/2021 -  Radiation Therapy   Brain Radiation [facility]   12/10/2021 Imaging   Brain MRI: Continued interval decrease in size of a left frontal lobe metastasis, now measuring 10 mm. Moderate surrounding edema has also decreased.  3-4 mm metastasis within the right occipital lobe, not significantly changed in size. There is now a small focus of central non enhancement within this lesion. Increased edema. 3 mm enhancing metastasis within the high left parietal lobe. In retrospect, this was likely present as a subtle punctate focus of enhancement on the prior brain MRI of 10/22/2021.   01/18/2022 Imaging   CT chest abdomen pelvis w contrast 1. Stable post treatment changes in the right upper lobe medially with dense radiation fibrosis but no findings to suggest local recurrent tumor. 2. Significant progression of ill-defined ground-glass opacity, interstitial thickening and airspace nodularity in the right upper lobe and upper aspect of the right lower lobe. Findings could reflect an inflammatory or atypical infectious process, drug induced pneumonitis or interstitial spread of tumor.3. No mediastinal or hilar mass or adenopathy. 4. No findings for abdominal/pelvic metastatic disease or osseous metastatic disease.  02/19/2022 Imaging   MRI brain w wo contrast showed Three metastatic deposits in the brain appear smaller. Decreased edema in the left parietal lobe. No new lesions    04/04/2022 Imaging   CT chest w contrast 1. Stable radiation changes in the right perihilar region without evidence of local recurrence. 2. Interval improvement in the patchy ground-glass opacities previously demonstrated in both lungs, most consistent with a resolving infectious or inflammatory process (including immunotherapy-related pneumonitis). 3. No evidence of  metastatic disease.   05/14/2022 Miscellaneous   He had a mechanical fall and fractured his left elbow. xrays showed a left elbow fracture  05/26/22 seen by orthopedic surgeon, cast applied.    05/27/2022 Imaging   MRI brain w wo contrast 1. All three known brain metastases have enlarged since August, with increasing regional edema. And there is a small new left anterior cerebellar 4-5 mm metastasis (was questionably punctate on the May MRI). This constellation could reflect a combination of True Progression (Left Cerebellar lesion) and pseudoprogression/radionecrosis (3 existing mets).    2. No significant intracranial mass effect. 3. Underlying moderate cerebral white matter signal changes.   05/30/2022 Imaging   CT chest abdomen pelvis w contrast  1. 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 There is a stable small right pleural effusion, but no definitive findings to suggest residual or recurrent disease on today's examination. 2. No signs of metastatic disease in the abdomen or pelvis.  3. Aortic atherosclerosis, in addition to two-vessel coronary artery disease. Please note that although the presence of coronary artery calcium documents the presence of coronary artery disease, the severity of this disease and any potential stenosis cannot be assessed on this non-gated CT examination. Assessment for potential risk factor modification, dietary therapy or pharmacologic therapy may be warranted, if clinically indicated. 4. Hepatic steatosis. 5. Colonic diverticulosis without evidence of acute diverticulitis at this time. 6. Incompletely imaged but apparently occluded vascular graft in the superior aspect of the right anterior thigh. Referral to vascular specialist for further clinical evaluation should be considered if clinically appropriate. 7. Additional incidental  findings, as above.   07/22/2022 Imaging   MRI brain  1. No new or enlarging lesions. No intracranial disease progression. 2. Decreased size of treated lesions of the left frontoparietal junction and left postcentral gyrus. 3. Unchanged lesions in the left cerebellum and right occipital lobe.   10/15/2022 Imaging   MRI brain w wo contrast showed Interval increase in size of multiple intraparenchymal lesions with worsened surrounding vasogenic edema. 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 No new lesions   11/05/2022 Imaging   MRI brain w wo contrast  1. Likely slight increase and right occipital and left cerebellar lesions. Otherwise, unchanged metastases, as above. No new lesions identified. 2. Partially imaged large probable lymph node in the left upper neck, apparent on prior PET CT.   11/13/2022 -  Radiation Therapy   Each of the sites were treated with SRS/SBRT/SRT-IMRT technique to 20 Gy in 1 fraction to each of the following sites: PTV_2_LCerebel_31mm PTV_3_RCerebel_59mm PTV_4_ROccip_49mm PTV_5_LParietal_77mm   11/27/2022 Imaging   CT chest abdomen pelvis w contrast showed  Improving bilateral scattered interstitial and ground-glass opacities. Small amount of residual areas in the extreme left lower lobe inferiorly. This specific area is slightly increased from previous but overall the amount of opacities is significantly improved. Recommend continued follow-up. No developing  new mass lesion, fluid collection or lymph node enlargement. Stable posttreatment changes right perihilar in the lung. Fatty liver infiltration. Colonic diverticulosis. Improved tiny right pleural effusion   02/05/2023 Imaging   MRI brain with and without contrast Interval decrease in all of the metastatic lesions, detailed above.No new lesions identified.    Today he feels well.Intermittent headache, is better. denies any focal  deficits. chronic SOB,not worse.  occasional cough. No new complaints.    Review of Systems  Constitutional:  Positive for fatigue. Negative for appetite change, chills, fever and unexpected weight change.  HENT:   Negative for hearing loss and voice change.   Eyes:  Negative for eye problems and icterus.  Respiratory:  Negative for chest tightness, cough and shortness of breath.   Cardiovascular:  Negative for chest pain and leg swelling.  Gastrointestinal:  Negative for abdominal distention and abdominal pain.  Endocrine: Negative for hot flashes.  Genitourinary:  Negative for difficulty urinating, dysuria and frequency.   Musculoskeletal:  Negative for arthralgias.  Skin:  Negative for itching and rash.  Neurological:  Positive for extremity weakness. Negative for headaches, light-headedness, numbness and speech difficulty.  Hematological:  Negative for adenopathy. Does not bruise/bleed easily.  Psychiatric/Behavioral:  Negative for confusion.       MEDICAL HISTORY:  Past Medical History:  Diagnosis Date   Allergy    Cancer (HCC)    lung   COPD (chronic obstructive pulmonary disease) (HCC)    Dyspnea    former smoker. only doe   History of kidney stones    Hypertension    Peripheral vascular disease (HCC)    Pre-diabetes    Sleep apnea     SURGICAL HISTORY: Past Surgical History:  Procedure Laterality Date   ANTERIOR CERVICAL DECOMP/DISCECTOMY FUSION N/A 03/14/2020   Procedure: ANTERIOR CERVICAL DECOMPRESSION/DISCECTOMY FUSION 3 LEVELS C4-7;  Surgeon: Venetia Night, MD;  Location: [facility] ORS;  Service: Neurosurgery;  Laterality: N/A;   BACK SURGERY  2011   CENTRAL LINE INSERTION Right 09/10/2016   Procedure: CENTRAL LINE INSERTION;  Surgeon: Renford Dills, MD;  Location: [facility] ORS;  Service: Vascular;  Laterality: Right;   CERVICAL FUSION     C4-c7 on Mar 14, 2020   COLONOSCOPY  2013 ?   COLONOSCOPY N/A 09/09/2021   Procedure: COLONOSCOPY;  Surgeon: Jaynie Collins, DO;  Location: [facility] ENDOSCOPY;  Service: Gastroenterology;  Laterality: N/A;   CORONARY ANGIOPLASTY     ESOPHAGOGASTRODUODENOSCOPY N/A 09/09/2021   Procedure: ESOPHAGOGASTRODUODENOSCOPY (EGD);  Surgeon: Jaynie Collins, DO;  Location: [facility]COPY;  Service: Gastroenterology;  Laterality: N/A;   FEMORAL-POPLITEAL BYPASS GRAFT Right 09/10/2016   Procedure: BYPASS GRAFT FEMORAL-POPLITEAL ARTERY ( BELOW KNEE );  Surgeon: Renford Dills, MD;  Location: [facility] ORS;  Service: Vascular;  Laterality: Right;   FEMORAL-TIBIAL BYPASS GRAFT Right 03/23/2020   Procedure: BYPASS GRAFT RIGHT FEMORAL- DISTAL TIBIAL ARTERY WITH DISTAL FLOW GRAFT;  Surgeon: Renford Dills, MD;  Location: [facility] ORS;  Service: Vascular;  Laterality: Right;   LEFT HEART CATH AND CORONARY ANGIOGRAPHY Left 05/16/2021   Procedure: LEFT HEART CATH AND CORONARY ANGIOGRAPHY;  Surgeon: Armando Reichert, MD;  Location: [facility] INVASIVE CV LAB;  Service: Cardiovascular;  Laterality: Left;   LOWER EXTREMITY ANGIOGRAPHY Right 11/18/2016   Procedure: Lower Extremity Angiography;  Surgeon: Renford Dills, MD;  Location: [facility] INVASIVE CV LAB;  Service: Cardiovascular;  Laterality: Right;   LOWER EXTREMITY ANGIOGRAPHY Right 12/09/2016   Procedure: Lower Extremity Angiography;  Surgeon: Levora Dredge  G, MD;  Location: [facility] INVASIVE CV LAB;  Service: Cardiovascular;  Laterality: Right;   LOWER EXTREMITY ANGIOGRAPHY Right 11/15/2019   Procedure: LOWER EXTREMITY ANGIOGRAPHY;  Surgeon: Renford Dills, MD;  Location: [facility] INVASIVE CV LAB;  Service: Cardiovascular;  Laterality: Right;   LOWER EXTREMITY ANGIOGRAPHY Right 12/01/2019   Procedure: Lower Extremity Angiography;  Surgeon: Annice Needy, MD;  Location: [facility] INVASIVE CV LAB;  Service: Cardiovascular;  Laterality: Right;   LOWER EXTREMITY ANGIOGRAPHY Right 12/02/2019   Procedure: LOWER EXTREMITY ANGIOGRAPHY;  Surgeon: Renford Dills, MD;  Location: [facility] INVASIVE CV  LAB;  Service: Cardiovascular;  Laterality: Right;   LOWER EXTREMITY ANGIOGRAPHY Right 03/23/2020   Procedure: Lower Extremity Angiography;  Surgeon: Renford Dills, MD;  Location: [facility] INVASIVE CV LAB;  Service: Cardiovascular;  Laterality: Right;   LOWER EXTREMITY ANGIOGRAPHY Right 09/25/2021   Procedure: Lower Extremity Angiography;  Surgeon: Renford Dills, MD;  Location: [facility] INVASIVE CV LAB;  Service: Cardiovascular;  Laterality: Right;   LOWER EXTREMITY ANGIOGRAPHY Right 09/26/2021   Procedure: Lower Extremity Angiography;  Surgeon: Annice Needy, MD;  Location: [facility] INVASIVE CV LAB;  Service: Cardiovascular;  Laterality: Right;   LOWER EXTREMITY ANGIOGRAPHY Right 04/08/2022   Procedure: Lower Extremity Angiography;  Surgeon: Renford Dills, MD;  Location: [facility] INVASIVE CV LAB;  Service: Cardiovascular;  Laterality: Right;   LOWER EXTREMITY INTERVENTION  11/18/2016   Procedure: Lower Extremity Intervention;  Surgeon: Renford Dills, MD;  Location: [facility] INVASIVE CV LAB;  Service: Cardiovascular;;   PERIPHERAL VASCULAR CATHETERIZATION Right 01/02/2015   Procedure: Lower Extremity Angiography;  Surgeon: Renford Dills, MD;  Location: [facility] INVASIVE CV LAB;  Service: Cardiovascular;  Laterality: Right;   PERIPHERAL VASCULAR CATHETERIZATION Right 06/18/2015   Procedure: Lower Extremity Angiography;  Surgeon: Annice Needy, MD;  Location: [facility] INVASIVE CV LAB;  Service: Cardiovascular;  Laterality: Right;   PERIPHERAL VASCULAR CATHETERIZATION  06/18/2015   Procedure: Lower Extremity Intervention;  Surgeon: Annice Needy, MD;  Location: [facility] INVASIVE CV LAB;  Service: Cardiovascular;;   PERIPHERAL VASCULAR CATHETERIZATION N/A 10/09/2015   Procedure: Abdominal Aortogram w/Lower Extremity;  Surgeon: Renford Dills, MD;  Location: [facility] INV[facility] CV LAB;  Service: Cardiovascular;  Laterality: N/A;   PERIPHERAL VASCULAR CATHETERIZATION  10/09/2015   Procedure: Lower Extremity Intervention;  Surgeon:  Renford Dills, MD;  Location: [facility] INV[facility] LAB;  Service: Cardiovascular;;   PERIPHERAL VASCULAR CATHETERIZATION Right 10/10/2015   Procedure: Lower Extremity Angiography;  Surgeon: Annice Needy, MD;  Location: [facility] IN[facility];  Service: Cardiovascular;  Laterality: Right;   PERIPHERAL VASCULAR CATHETERIZATION Right 10/10/2015   Procedure: Lower Extremity Intervention;  Surgeon: Annice Needy, MD;  Location: [facility];  Service: Cardiovascular;  Laterality: Right;   PERIPHERAL VASCULAR CATHETERIZATION Right 12/03/2015   Procedure: Lower Extremity Angiography;  Surgeon: Annice Needy, MD;  Location: [facility];  Service: Cardiovascular;  Laterality: Right;   PERIPHERAL VASCULAR CATHETERIZATION Right 12/04/2015   Procedure: Lower Extremity Angiography;  Surgeon: Annice Needy, MD;  Location: [facility];  Service: Cardiovascular;  Laterality: Right;   PERIPHERAL VASCULAR CATHETERIZATION  12/04/2015   Procedure: Lower Extremity Intervention;  Surgeon: Annice Needy, MD;  Location: [facility];  Service: Cardiovascular;;   PERIPHERAL VASCULAR CATHETERIZATION Right 06/30/2016   Procedure: Lower Extremity Angiography;  Surgeon: Annice Needy, MD;  Location: [facility];  Service: Cardiovascular;  Laterality: Right;   PERIPHERAL VASCULAR CATHETERIZATION Right  06/25/2016   Procedure: Lower Extremity Angiography;  Surgeon: Renford Dills, MD;  Location: [facility];  Service: Cardiovascular;  Laterality: Right;   PERIPHERAL VASCULAR CATHETERIZATION Right 07/01/2016   Procedure: Lower Extremity Angiography;  Surgeon: Renford Dills, MD;  Location: [facility];  Service: Cardiovascular;  Laterality: Right;   PERIPHERAL VASCULAR CATHETERIZATION  07/01/2016   Procedure: Lower Extremity Intervention;  Surgeon: Renford Dills, MD;  Location: [facility] IN[facility];  Service: Cardiovascular;;   PERIPHERAL VASCULAR CATHETERIZATION Right 08/01/2016    Procedure: Lower Extremity Angiography;  Surgeon: Renford Dills, MD;  Location: [facility] INV[facility] CV LAB;  Service: Cardiovascular;  Laterality: Right;   PORTA CATH INSERTION N/A 10/05/2020   Procedure: PORTA CATH INSERTION;  Surgeon: Renford Dills, MD;  Location: [facility] INV[facility]IVE CV LAB;  Service: Cardiovascular;  Laterality: N/A;   stent placement in right leg Right    VIDEO BRONCHOSCOPY N/A 09/14/2020   Procedure: VIDEO BRONCHOSCOPY WITH FLUORO;  Surgeon: Salena Saner, MD;  Location: [facility] ORS;  Service: Cardiopulmonary;  Laterality: N/A;   VIDEO BRONCHOSCOPY WITH ENDOBRONCHIAL ULTRASOUND N/A 09/14/2020   Procedure: VIDEO BRONCHOSCOPY WITH ENDOBRONCHIAL ULTRASOUND;  Surgeon: Salena Saner, MD;  Location: [facility];  Service: Cardiopulmonary;  Laterality: N/A;    SOCIAL HISTORY: Social History   Socioeconomic History   Marital status: Single    Spouse name: Not on file   Number of children: Not on file   Years of education: Not on file   Highest education level: Not on file  Occupational History   Occupation: unemployed  Tobacco Use   Smoking status: Former    Current packs/day: 0.00    Average packs/day: 2.0 packs/day for 38.0 years (76.0 ttl pk-yrs)    Types: Cigarettes    Start date: 12/19/1976    Quit date: 12/20/2014    Years since quitting: 8.2   Smokeless tobacco: Never   Tobacco comments:    quit smoking in 2017 most smoked 2   Vaping Use   Vaping status: Former  Substance and Sexual Activity   Alcohol use: Not Currently   Drug use: No   Sexual activity: Yes  Other Topics Concern   Not on file  Social History Narrative   Not on file   Social Determinants of Health   Financial Resource Strain: Low Risk  (12/29/2022)   Received from [facility], [facility]   Overall Financial Resource Strain (CARDIA)    Difficulty of Paying Living Expenses: Not hard at all  Food Insecurity: No Food Insecurity (12/29/2022)   Received  from [facility], [facility]   Hunger Vital Sign    Worried About Running Out of Food in the Last Year: Never true    Ran Out of Food in the Last Year: Never true  Transportation Needs: No Transportation Needs (12/29/2022)   Received from [facility], [facility]   [facility] - Transportation    In the past 12 months, has lack of transportation kept you from medical appointments or from getting medications?: No    Lack of Transportation (Non-Medical): No  Physical Activity: Not on file  Stress: Not on file  Social Connections: Not on file  Intimate Partner Violence: Not on file    FAMILY HISTORY: Family History  Problem Relation Age of Onset   Diabetes Mother    Hypertension Mother    Heart murmur Mother    Leukemia  Mother    Throat cancer Maternal Grandmother    Colon cancer Neg Hx    Breast cancer Neg Hx     ALLERGIES:  has No Known Allergies.  MEDICATIONS:  Current Outpatient Medications  Medication Sig Dispense Refill   aspirin EC 81 MG tablet Take 81 mg by mouth daily. Swallow whole.     clopidogrel (PLAVIX) 75 MG tablet TAKE 1 TABLET BY MOUTH DAILY 100 tablet 2   levothyroxine (SYNTHROID) 100 MCG tablet Take 1 tablet (100 mcg total) by mouth daily before breakfast. 90 tablet 0   levothyroxine (SYNTHROID) 50 MCG tablet Take 0.5 tablets (25 mcg total) by mouth daily before breakfast. Take with his levothyroxine. 30 tablet 0   simvastatin (ZOCOR) 40 MG tablet Take 40 mg by mouth at bedtime.     No current facility-administered medications for this visit.   Facility-Administered Medications Ordered in Other Visits  Medication Dose Route Frequency Provider Last Rate Last Admin   albuterol (PROVENTIL) (2.5 MG/3ML) 0.083% nebulizer solution 2.5 mg  2.5 mg Nebulization Once Salena Saner, MD         PHYSICAL EXAMINATION: ECOG PERFORMANCE STATUS: 1 - Symptomatic but completely  ambulatory Vitals:   03/05/23 0856  BP: 124/73  Pulse: 95  Resp: 18  Temp: (!) 97.2 F (36.2 C)  SpO2: 97%   Filed Weights   03/05/23 0856  Weight: 217 lb 12.8 oz (98.8 kg)    Physical Exam Constitutional:      General: He is not in acute distress.    Appearance: He is obese. He is not diaphoretic.     Comments: Patient walks with a cane  HENT:     Head: Normocephalic and atraumatic.  Eyes:     General: No scleral icterus. Cardiovascular:     Rate and Rhythm: Normal rate and regular rhythm.     Heart sounds: No murmur heard. Pulmonary:     Effort: Pulmonary effort is normal. No respiratory distress.  Abdominal:     General: There is no distension.     Palpations: Abdomen is soft.     Tenderness: There is no abdominal tenderness.  Musculoskeletal:     Cervical back: Normal range of motion and neck supple.  Skin:    General: Skin is warm and dry.     Findings: No erythema.  Neurological:     Mental Status: He is alert and oriented to person, place, and time. Mental status is at baseline.     Cranial Nerves: No cranial nerve deficit.     Motor: No abnormal muscle tone.  Psychiatric:        Mood and Affect: Mood and affect normal.      LABORATORY DATA:  I have reviewed the data as listed    Latest Ref Rng & Units 03/05/2023    8:44 AM 02/19/2023    8:47 AM 02/05/2023    1:05 PM  CBC  WBC 4.0 - 10.5 K/uL 5.3  7.5  5.8   Hemoglobin 13.0 - 17.0 g/dL 16.1  09.6  04.5   Hematocrit 39.0 - 52.0 % 41.4  42.4  37.7   Platelets 150 - 400 K/uL 233  254  209       Latest Ref Rng & Units 03/05/2023    8:44 AM 02/19/2023    8:47 AM 02/05/2023    1:05 PM  CMP  Glucose 70 - 99 mg/dL 409  811  914   BUN 8 - 23 mg/dL 15  13  11   Creatinine 0.61 - 1.24 mg/dL 0.35  0.09  3.81   Sodium 135 - 145 mmol/L 139  138  142   Potassium 3.5 - 5.1 mmol/L 3.9  3.7  3.2   Chloride 98 - 111 mmol/L 107  105  108   CO2 22 - 32 mmol/L 24  24  24    Calcium 8.9 - 10.3 mg/dL 8.9  9.3  8.8    Total Protein 6.5 - 8.1 g/dL 6.7  7.4  6.3   Total Bilirubin 0.3 - 1.2 mg/dL 0.5  0.9  0.3   Alkaline Phos 38 - 126 U/L 71  74  65   AST 15 - 41 U/L 24  25  20    ALT 0 - 44 U/L 20  20  18       RADIOGRAPHIC STUDIES: I have personally reviewed the radiological images as listed and agreed with the findings in the report. MR BRAIN W WO CONTRAST  Result Date: 02/14/2023 CLINICAL DATA:  Brain/CNS neoplasm, assess treatment response. EXAM: MRI HEAD WITHOUT AND WITH CONTRAST TECHNIQUE: Multiplanar, multiecho pulse sequences of the brain and surrounding structures were obtained without and with intravenous contrast. CONTRAST:  10mL GADAVIST GADOBUTROL 1 MMOL/ML IV SOLN COMPARISON:  MRI head November 05, 2022. FINDINGS: Brain: Interval decrease in multiple enhancing lesions. Posterior left temporal lobe lesion now measures 18 mm (previously 21 mm). High left parietal lobe lesion now measures 11 mm (previously 14 mm). Right occipital lobe lesion measures 11 mm (previously 17 mm). Left cerebellar lesion is now punctate (previously 7 mm). No new enhancing lesions identified. Edema surrounding lesions is also decreased. No progressive mass effect. No midline shift. No evidence of acute infarct, mass occupying acute hemorrhage, or hydrocephalus. Vascular: Major arterial flow voids are maintained at the skull base. Skull and upper cervical spine: Normal marrow signal. Sinuses/Orbits: Clear sinuses.  No acute orbital findings. Other: No mastoid effusions. IMPRESSION: Interval decrease in all of the metastatic lesions, detailed above. No new lesions identified. Electronically Signed   By: Feliberto Harts M.D.   On: 02/14/2023 14:52   VAS [place] ABI WITH/WO TBI  Result Date: 01/08/2023  LOWER EXTREMITY DOPPLER STUDY Patient Name:  DALYNN FARAG  Date of Exam:   01/05/2023 Medical Rec #: [id number]            Accession #:    [id number] Date of Birth: [birth year]           Patient Gender: M Patient Age:   67 years Exam  Location:  [facility] Procedure:      VAS [place] ABI WITH/WO TBI Referring Phys: Sheppard Plumber --------------------------------------------------------------------------------  Indications: Claudication, peripheral artery disease, and Routine f/u. High Risk Factors: Hyperlipidemia. Other Factors: ASO s/p multiple interventions and bypass.                11/15/2019 Right thrombectomy of Fempop insitu graft with added                stent throughout.                 12/01/2019: Aortogram and Selctive Right Lower Extremity                Angiogram. 6 mg of TPA delivered in a lysis catheterfrom the                Origin of the Right Femoral -Popliteal bypass down the proximal  ATA with placement of the lysis.                 12/02/2019: PTA of the Right Posterior Tibial Artery. PTA of the                Right Peroneal Artery. Mechanical Thrombectomy of the Right                Femoral -Popliteal Bypass graft. Mechanical Thrombectomy of the                Right Tibioperoneal Trunk and Posterior Tibial Artery.                 09/25/2021: Right Lower Extremity Angiography third order                catheter placement. PTA of the proximal peroneal and                tibioperoneal trunk to 2.5 mm with an ultra score balloon. PTA of                the Femoral Artery and the proximal anstomosis of the bypass                graft to 4 mm with an ultra score balloon. Administration of 5 mg                of tpa throughout the entire bypass and in the peroneal Artery.                Initiation of continuous tpa infusion Right lower Extremity for                limb salvage. Insertion of a triple lumen catheter Left CFV with                [place] and fluoroscopic guidance.                 09/26/2021: Right LE Angiogram. PTA of the Right Posterior Tibial                Artery with 2.5 mm diameter by 30 cm length angioplasty balloon.                PTA of the distal bypass anastomosis and distal bypass graft                 analogous to the Popliteal artery with 4 mm diameter by 8 cm                length Lutonix drug coated angioplasty balloon.                04/08/2022 right thrombectomy of the Deep profunda.  Vascular Interventions: Hx of multiple right lower extremity PTA's/stents.                         09/10/2016 Right femoral to below the knee popliteal                         artery bypass graft placement. 5/01 Right bypass graft &                         mid popliteal artery PTA & coil embolectomy of large  branch of bypass graft. 12/09/16 Right distal bypass                         graft PTA/stent and right posterior tibial artery PTA.                         03/23/2020 Rt PTA thrombectomy. Rt Profunda fem artery to                         PTA bypass. Comparison Study: 09/05/2022 Performing Technologist: Debbe Bales RVS  Examination Guidelines: A complete evaluation includes at minimum, Doppler waveform signals and systolic blood pressure reading at the level of bilateral brachial, anterior tibial, and posterior tibial arteries, when vessel segments are accessible. Bilateral testing is considered an integral part of a complete examination. Photoelectric Plethysmograph (PPG) waveforms and toe systolic pressure readings are included as required and additional duplex testing as needed. Limited examinations for reoccurring indications may be performed as noted.  ABI Findings: +---------+------------------+-----+------------------+--------+ Right    Rt Pressure (mmHg)IndexWaveform          Comment  +---------+------------------+-----+------------------+--------+ Brachial 155                                               +---------+------------------+-----+------------------+--------+ ATA                             Non pulsatile flow         +---------+------------------+-----+------------------+--------+ PTA      69                0.45 monophasic                  +---------+------------------+-----+------------------+--------+ PERO     56                0.36 monophasic                 +---------+------------------+-----+------------------+--------+ Great Toe66                0.43 Abnormal                   +---------+------------------+-----+------------------+--------+ +---------+------------------+-----+---------+-------+ Left     Lt Pressure (mmHg)IndexWaveform Comment +---------+------------------+-----+---------+-------+ Brachial 155                                     +---------+------------------+-----+---------+-------+ ATA      158               1.02 triphasic        +---------+------------------+-----+---------+-------+ PTA      159               1.03 triphasic        +---------+------------------+-----+---------+-------+ Great Toe158               1.02 Normal           +---------+------------------+-----+---------+-------+ +-------+-----------+-----------+------------+------------+ ABI/TBIToday's ABIToday's TBIPrevious ABIPrevious TBI +-------+-----------+-----------+------------+------------+ Right  .45        .43        .63         .31          +-------+-----------+-----------+------------+------------+  Left   1.03       1.02       1.11        .91          +-------+-----------+-----------+------------+------------+ Bilateral TBIs appear increased compared to prior study on 09/05/2022. Left ABIs appear essentially unchanged compared to prior study on 09/05/2022. The Right ABIs appear to be decreased compared to prior study on 09/05/2022.  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 The right toe-brachial index is abnormal. Imaged the Right ATA and Peroneal Artery at the level of the Ankle; Non pulsatile flow seen in the Right ATA and Monophasic flow seen in the Peroneal Artery. Left: Resting left ankle-brachial index is within normal range.  The left toe-brachial index is normal. *See table(s) above for measurements and observations.   Electronically signed by Levora Dredge MD on 01/08/2023 at 3:23:11 PM.    Final

## 2023-03-05 NOTE — Assessment & Plan Note (Signed)
 Status post radiation,  02/19/22 MRI showed decreased brain lesion size and edema.  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 Mitzi Hansen.  MRI on 02/05/23 stable improved

## 2023-03-05 NOTE — Assessment & Plan Note (Signed)
Durvalumab treatment as planned.  

## 2023-03-05 NOTE — Assessment & Plan Note (Signed)
continue levothyroxine to 125 mcg daily

## 2023-03-05 NOTE — Assessment & Plan Note (Signed)
 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  May 2024 CT scan showed Improving bilateral scattered interstitial and ground-glass opacities. Discussed with pulmonology Dr. Jayme Cloud. 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 Proceed with Durvalumab.  Repeat CT In August

## 2023-03-06 ENCOUNTER — Ambulatory Visit: Payer: Medicare Other | Admitting: Oncology

## 2023-03-06 ENCOUNTER — Other Ambulatory Visit: Payer: Medicare Other

## 2023-03-06 LAB — T4: T4, Total: 9.7 ug/dL (ref 4.5–12.0)

## 2023-03-12 ENCOUNTER — Ambulatory Visit
Admission: RE | Admit: 2023-03-12 | Discharge: 2023-03-12 | Disposition: A | Discharge status: Home / Self Care | Payer: Medicare Other | Source: Ambulatory Visit | Attending: Oncology | Admitting: Oncology

## 2023-03-12 DIAGNOSIS — C3491 Malignant neoplasm of unspecified part of right bronchus or lung: Secondary | ICD-10-CM | POA: Insufficient documentation

## 2023-03-12 MED ORDER — IOHEXOL 350 MG/ML SOLN
100.0000 mL | Freq: Once | INTRAVENOUS | Status: AC | PRN
  Administered 2023-03-12: 100 mL via INTRAVENOUS

## 2023-03-19 ENCOUNTER — Encounter: Payer: Self-pay | Admitting: Oncology

## 2023-03-19 ENCOUNTER — Inpatient Hospital Stay: Payer: Medicare Other

## 2023-03-19 ENCOUNTER — Inpatient Hospital Stay (HOSPITAL_BASED_OUTPATIENT_CLINIC_OR_DEPARTMENT_OTHER): Payer: Medicare Other | Admitting: Oncology

## 2023-03-19 VITALS — BP 149/75 | HR 79 | Temp 97.6°F | Resp 18 | Wt 218.0 lb

## 2023-03-19 DIAGNOSIS — C3491 Malignant neoplasm of unspecified part of right bronchus or lung: Secondary | ICD-10-CM

## 2023-03-19 DIAGNOSIS — C7931 Secondary malignant neoplasm of brain: Secondary | ICD-10-CM | POA: Diagnosis not present

## 2023-03-19 DIAGNOSIS — Z5112 Encounter for antineoplastic immunotherapy: Secondary | ICD-10-CM | POA: Diagnosis not present

## 2023-03-19 DIAGNOSIS — E032 Hypothyroidism due to medicaments and other exogenous substances: Secondary | ICD-10-CM | POA: Diagnosis not present

## 2023-03-19 LAB — COMPREHENSIVE METABOLIC PANEL
ALT: 21 U/L (ref 0–44)
AST: 22 U/L (ref 15–41)
Albumin: 3.9 g/dL (ref 3.5–5.0)
Alkaline Phosphatase: 65 U/L (ref 38–126)
Anion gap: 6 (ref 5–15)
BUN: 19 mg/dL (ref 8–23)
CO2: 24 mmol/L (ref 22–32)
Calcium: 9 mg/dL (ref 8.9–10.3)
Chloride: 108 mmol/L (ref 98–111)
Creatinine, Ser: 1.01 mg/dL (ref 0.61–1.24)
GFR, Estimated: >60 mL/min (ref >60)
Glucose, Bld: 106 mg/dL — ABNORMAL HIGH (ref 70–99)
Potassium: 3.5 mmol/L (ref 3.5–5.1)
Sodium: 138 mmol/L (ref 135–145)
Total Bilirubin: 0.5 mg/dL (ref 0.3–1.2)
Total Protein: 6.7 g/dL (ref 6.5–8.1)

## 2023-03-19 LAB — CBC WITH DIFFERENTIAL/PLATELET
Abs Immature Granulocytes: 0.01 10*3/uL (ref 0.00–0.07)
Basophils Absolute: 0.1 10*3/uL (ref 0.0–0.1)
Basophils Relative: 2 %
Eosinophils Absolute: 0.2 10*3/uL (ref 0.0–0.5)
Eosinophils Relative: 6 %
HCT: 38.7 % — ABNORMAL LOW (ref 39.0–52.0)
Hemoglobin: 12.4 g/dL — ABNORMAL LOW (ref 13.0–17.0)
Immature Granulocytes: 0 %
Lymphocytes Relative: 23 %
Lymphs Abs: 0.8 10*3/uL (ref 0.7–4.0)
MCH: 27.4 pg (ref 26.0–34.0)
MCHC: 32 g/dL (ref 30.0–36.0)
MCV: 85.4 fL (ref 80.0–100.0)
Monocytes Absolute: 0.5 10*3/uL (ref 0.1–1.0)
Monocytes Relative: 13 %
Neutro Abs: 2 10*3/uL (ref 1.7–7.7)
Neutrophils Relative %: 56 %
Platelets: 222 10*3/uL (ref 150–400)
RBC: 4.53 MIL/uL (ref 4.22–5.81)
RDW: 14.2 % (ref 11.5–15.5)
WBC: 3.5 10*3/uL — ABNORMAL LOW (ref 4.0–10.5)
nRBC: 0 % (ref 0.0–0.2)

## 2023-03-19 LAB — TSH: TSH: 11.143 u[IU]/mL — ABNORMAL HIGH (ref 0.350–4.500)

## 2023-03-19 MED ORDER — SODIUM CHLORIDE 0.9 % IV SOLN
Freq: Once | INTRAVENOUS | Status: AC
  Filled 2023-03-19: qty 250

## 2023-03-19 MED ORDER — SODIUM CHLORIDE 0.9 % IV SOLN
10.0000 mg/kg | Freq: Once | INTRAVENOUS | Status: AC
  Administered 2023-03-19: 1000 mg via INTRAVENOUS
  Filled 2023-03-19: qty 20

## 2023-03-19 MED ORDER — HEPARIN SOD (PORK) LOCK FLUSH 100 UNIT/ML IV SOLN
500.0000 [IU] | Freq: Once | INTRAVENOUS | Status: AC | PRN
  Administered 2023-03-19: 500 [IU]
  Filled 2023-03-19: qty 5

## 2023-03-19 NOTE — Assessment & Plan Note (Addendum)
Status post radiation,  02/19/22 MRI showed decreased brain lesion size and edema.  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 Mitzi Hansen.  MRI on 02/05/23 stable improved - MRI surveillance in Oct 2024

## 2023-03-19 NOTE — Assessment & Plan Note (Signed)
 continue levothyroxine to 125 mcg daily

## 2023-03-19 NOTE — Assessment & Plan Note (Addendum)
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  May 2024 CT scan showed Improving bilateral scattered interstitial and ground-glass opacities. Discussed with pulmonology Dr. Jayme Cloud. 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  Labs are reviewed and discussed with patient. Proceed with Durvalumab.

## 2023-03-19 NOTE — Progress Notes (Signed)
Hematology/Oncology Progress note [phone] [phone]    CHIEF COMPLAINTS/REASON FOR VISIT:  Follow up for lung cancer   ASSESSMENT & PLAN:   Cancer Staging  Squamous cell carcinoma of right lung (HCC) Staging form: Lung, AJCC 8th Edition - Clinical stage from 09/20/2020: Stage IV (cT1b, cN2, cM1) - Signed by [name], MD on 12/13/2021   Squamous cell carcinoma of right lung (HCC) Durvalumab was previously held due to pneumonitis.  May 2024 CT scan showed Improving bilateral scattered interstitial and ground-glass opacities. Discussed with pulmonology Dr. Jayme Cloud. 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  Labs are reviewed and discussed with patient. Proceed with Durvalumab.    Hypothyroidism due to medication continue levothyroxine to 125 mcg daily    Encounter for antineoplastic immunotherapy Durvalumab treatment as planned.   Malignant neoplasm metastatic to brain [facility]) Status post radiation,  02/19/22 MRI showed decreased brain lesion size and edema.  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 Mitzi Hansen.  MRI on 02/05/23 stable improved - MRI surveillance in Oct 2024    No orders of the defined types were placed in this encounter.   Follow up 2 weeks lab MD Durvalumab All questions were answered. The patient knows to call the clinic with any problems, questions or concerns.  [name], MD, PhD [facility] Hematology Oncology 03/19/2023       HISTORY OF PRESENTING ILLNESS:  Joshua [name] is a 67 y.o. male who has above history reviewed by me today presents for follow  up visit for Stage IV lung squamous cell carcinoma. Oncology History  Squamous cell carcinoma of right lung (HCC)  09/18/2020 Imaging   PET scan showed right upper lobe nodule maximal SUV 11.5.  Hypermetabolic cluster right paratracheal adenopathy with maximum SUV up to 12.  No findings of metastatic disease to the neck/abdomen/pelvis or skeleton.   09/20/2020 Initial Diagnosis   Squamous cell carcinoma of right lung (HCC) NGS showed PD-L1 TPS 95%, LRP1B S956fs, PIK3CA E545K, RB1 c1422-2A>T, RIT1 T83_ A84del, STK11 P259fs, TPS53 R248W, TMB 19.23mut/mb, MS stable.    09/20/2020 Cancer Staging   Staging form: Lung, AJCC 8th Edition - Clinical stage from 09/20/2020: Stage IV (cT1b, cN2, cM1) - Signed by [name], MD on 12/13/2021   10/03/2020 Imaging   MRI brain with and without contrast showed no evidence of intracranial metastatic disease. Well-defined round lesion within the left parotid tail measuring approximately 2.3 x 1.8 x 1.8 cm   10/09/2020 - 11/27/2020 Radiation Therapy   concurrent chemoradiation   10/10/2020 - 10/31/2020 Chemotherapy   10/10/2020 concurrent chemoradiation.  carboplatin AUC of 2 and Taxol 45 mg/m2 x 4.  Additional chemotherapy was held due to thrombocytopenia, neutropenia       12/31/2020 - 03/21/2022 Chemotherapy   Maintenance  Durvalumab q14d      12/31/2020 -  Chemotherapy   Patient is on Treatment Plan : LUNG Durvalumab (10) q14d     03/28/2021 Imaging   CT without contrast showed new linear right perihilar consolidation with associated traction bronchiectasis.  Likely postradiation changes.  Additional new peribronchial vascular/nodular groundglass opacities with areas of consolidation or seen primarily in the right upper and lower lobes,  more distant from expected radiation fields. 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  Per my discussion with pulmonology Dr.  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     05/09/2021 Imaging   Repeat CT chest without contrast showed evolving radiation changes in the right perihilar region likely accounting for interval increase to thickening of the posterior wall of the right upper lobe bronchus.  The more peripheral right lung groundglass opacities seen on most recent study have generally improved with exception of one peripherally right upper lobe which appears new.  These are likely inflammatory.  No findings highly suspicious for local recurrence.   05/30/2021 - 01/10/2022 Chemotherapy   Durvalumab q14d     10/07/2021 Imaging   MRI brain with and without contrast showed 2 contrast-enhancing lesions, most consistent with metastatic disease.  Largest lesion is in the left frontal lobe and measures up to 1.3 cm with a large amount of surrounding edema and 9 mm rightward midline shift.  The other enhancing lesion was 4 mm in the right occipital lobe with a small amount of surrounding edema.  No acute or chronic hemorrhage.   10/07/2021 Progression   Headache after colonoscopy, progressive difficulty using her right arm.  Progressively worsening of speech. Stage IV lung cancer with brain metastasis.  started on Dexamethasone.  He was seen by neurosurgery and radiation oncology.  Recommendation is to proceed with SRS.  Patient was discharged with dexamethasone   10/16/2021 Imaging   PET scan showed postradiation changes in the right supra hilar lung without evidence of local recurrence.  No evidence of lung cancer recurrence or metastatic disease.  Single focus of intense metabolic activity associated with posterior left 11th rib likely secondary to recent fall/posttraumatic metabolic changes.  Frontal  uptake in the left frontal lobe corresponding to the known metastatic lesion in the brain   10/29/2021 -  Radiation Therapy   Brain Radiation [facility]   12/10/2021 Imaging   Brain MRI: Continued  interval decrease in size of a left frontal lobe metastasis, now measuring 10 mm. Moderate surrounding edema has also decreased.  3-4 mm metastasis within the right occipital lobe, not significantly changed in size. There is now a small focus of central non enhancement within this lesion. Increased edema. 3 mm enhancing metastasis within the high left parietal lobe. In retrospect, this was likely present as a subtle punctate focus of enhancement on the prior brain MRI of 10/22/2021.   01/18/2022 Imaging   CT chest abdomen pelvis w contrast 1. Stable post treatment changes in the right upper lobe medially with dense radiation fibrosis but no findings to suggest local recurrent tumor. 2. Significant progression of ill-defined ground-glass opacity, interstitial thickening and airspace nodularity in the right upper lobe and upper aspect of the right lower lobe. Findings could reflect an inflammatory or atypical infectious process, drug induced pneumonitis or interstitial spread of tumor.3. No mediastinal or hilar mass or adenopathy. 4. No findings for abdominal/pelvic metastatic disease or osseous metastatic disease.   02/19/2022 Imaging   MRI brain w wo contrast showed Three metastatic deposits in the brain appear smaller. Decreased edema in the left parietal lobe. No new lesions    04/04/2022 Imaging   CT chest w contrast 1. Stable radiation changes in the right perihilar region without evidence of local recurrence. 2. Interval improvement in the patchy ground-glass opacities previously demonstrated in both lungs, most consistent  with a resolving infectious or inflammatory process (including immunotherapy-related pneumonitis). 3. No evidence of metastatic disease.   05/14/2022 Miscellaneous   He had a mechanical fall and fractured his left elbow. xrays showed a left elbow fracture  05/26/22 seen by orthopedic surgeon, cast applied.    05/27/2022 Imaging   MRI brain w wo contrast 1. All three known  brain metastases have enlarged since August, with increasing regional edema. And there is a small new left anterior cerebellar 4-5 mm metastasis (was questionably punctate on the May MRI). This constellation could reflect a combination of True Progression (Left Cerebellar lesion) and pseudoprogression/radionecrosis (3 existing mets).    2. No significant intracranial mass effect. 3. Underlying moderate cerebral white matter signal changes.   05/30/2022 Imaging   CT chest abdomen pelvis w contrast  1. 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 There is a stable small right pleural effusion, but no definitive findings to suggest residual or recurrent disease on today's examination. 2. No signs of metastatic disease in the abdomen or pelvis.  3. Aortic atherosclerosis, in addition to two-vessel coronary artery disease. Please note that although the presence of coronary artery calcium documents the presence of coronary artery disease, the severity of this disease and any potential stenosis cannot be assessed on this non-gated CT examination. Assessment for potential risk factor modification, dietary therapy or pharmacologic therapy may be warranted, if clinically indicated. 4. Hepatic steatosis. 5. Colonic diverticulosis without evidence of acute diverticulitis at this time. 6. Incompletely imaged but apparently occluded vascular graft in the superior aspect of the right anterior thigh. Referral to vascular specialist for further clinical evaluation should be considered if clinically appropriate. 7. Additional incidental findings, as above.   07/22/2022 Imaging   MRI brain  1. No new or enlarging lesions. No intracranial disease progression. 2. Decreased size of treated lesions of the left frontoparietal junction and left postcentral gyrus. 3. Unchanged lesions in the left  cerebellum and right occipital lobe.   10/15/2022 Imaging   MRI brain w wo contrast showed Interval increase in size of multiple intraparenchymal lesions with worsened surrounding vasogenic edema. 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 No new lesions   11/05/2022 Imaging   MRI brain w wo contrast  1. Likely slight increase and right occipital and left cerebellar lesions. Otherwise, unchanged metastases, as above. No new lesions identified. 2. Partially imaged large probable lymph node in the left upper neck, apparent on prior PET CT.   11/13/2022 -  Radiation Therapy   Each of the sites were treated with SRS/SBRT/SRT-IMRT technique to 20 Gy in 1 fraction to each of the following sites: PTV_2_LCerebel_82mm PTV_3_RCerebel_96mm PTV_4_ROccip_44mm PTV_5_LParietal_62mm   11/27/2022 Imaging   CT chest abdomen pelvis w contrast showed  Improving bilateral scattered interstitial and ground-glass opacities. Small amount of residual areas in the extreme left lower lobe inferiorly. This specific area is slightly increased from previous but overall the amount of opacities is significantly improved. Recommend continued follow-up. No developing new mass lesion, fluid collection or lymph node enlargement. Stable posttreatment changes right perihilar in the lung. Fatty liver infiltration. Colonic diverticulosis. Improved tiny right pleural effusion   02/05/2023 Imaging   MRI brain with and without contrast Interval decrease in all of the metastatic lesions, detailed above.No new lesions identified.    Imaging   CT chest abdomen pelvis w contrast showed  1. Similar  right perihilar and central upper lobe radiation fibrosis, without recurrent or metastatic disease. 2. Trace right pleural fluid or thickening, new or minimally increased. 3. Incidental findings, including: Aortic atherosclerosis (ICD10-I70.0), coronary artery  atherosclerosis and emphysema (ICD10-J43.9). Hepatic steatosis.    Today he feels well.Intermittent headache, is better. denies any focal deficits. chronic SOB,not worse.  occasional cough. No new complaints.    Review of Systems  Constitutional:  Positive for fatigue. Negative for appetite change, chills, fever and unexpected weight change.  HENT:   Negative for hearing loss and voice change.   Eyes:  Negative for eye problems and icterus.  Respiratory:  Negative for chest tightness, cough and shortness of breath.   Cardiovascular:  Negative for chest pain and leg swelling.  Gastrointestinal:  Negative for abdominal distention and abdominal pain.  Endocrine: Negative for hot flashes.  Genitourinary:  Negative for difficulty urinating, dysuria and frequency.   Musculoskeletal:  Negative for arthralgias.  Skin:  Negative for itching and rash.  Neurological:  Positive for extremity weakness. Negative for headaches, light-headedness, numbness and speech difficulty.  Hematological:  Negative for adenopathy. Does not bruise/bleed easily.  Psychiatric/Behavioral:  Negative for confusion.       MEDICAL HISTORY:  Past Medical History:  Diagnosis Date   Allergy    Cancer (HCC)    lung   COPD (chronic obstructive pulmonary disease) (HCC)    Dyspnea    former smoker. only doe   History of kidney stones    Hypertension    Peripheral vascular disease (HCC)    Pre-diabetes    Sleep apnea     SURGICAL HISTORY: Past Surgical History:  Procedure Laterality Date   ANTERIOR CERVICAL DECOMP/DISCECTOMY FUSION N/A 03/14/2020   Procedure: ANTERIOR CERVICAL DECOMPRESSION/DISCECTOMY FUSION 3 LEVELS C4-7;  Surgeon: Venetia Night, MD;  Location: [facility] ORS;  Service: Neurosurgery;  Laterality: N/A;   BACK SURGERY  2011   CENTRAL LINE INSERTION Right 09/10/2016   Procedure: CENTRAL LINE INSERTION;  Surgeon: Renford Dills, MD;  Location: [facility] ORS;  Service: Vascular;  Laterality: Right;    CERVICAL FUSION     C4-c7 on Mar 14, 2020   COLONOSCOPY  2013 ?   COLONOSCOPY N/A 09/09/2021   Procedure: COLONOSCOPY;  Surgeon: Jaynie Collins, DO;  Location: [facility] ENDOSCOPY;  Service: Gastroenterology;  Laterality: N/A;   CORONARY ANGIOPLASTY     ESOPHAGOGASTRODUODENOSCOPY N/A 09/09/2021   Procedure: ESOPHAGOGASTRODUODENOSCOPY (EGD);  Surgeon: Jaynie Collins, DO;  Location[facility] ENDOSCOPY;  Service: Gastroenterology;  Laterality: N/A;   FEMORAL-POPLITEAL BYPASS GRAFT Right 09/10/2016   Procedure: BYPASS GRAFT FEMORAL-POPLITEAL ARTERY ( BELOW KNEE );  Surgeon: Renford Dills, MD;  Location: [facility] ORS;  Service: Vascular;  Laterality: Right;   FEMORAL-TIBIAL BYPASS GRAFT Right 03/23/2020   Procedure: BYPASS GRAFT RIGHT FEMORAL- DISTAL TIBIAL ARTERY WITH DISTAL FLOW GRAFT;  Surgeon: Renford Dills, MD;  Location: [facility] ORS;  Service: Vascular;  Laterality: Right;   LEFT HEART CATH AND CORONARY ANGIOGRAPHY Left 05/16/2021   Procedure: LEFT HEART CATH AND CORONARY ANGIOGRAPHY;  Surgeon: Armando Reichert, MD;  Location: [facility] INVASIVE CV LAB;  Service: Cardiovascular;  Laterality: Left;   LOWER EXTREMITY ANGIOGRAPHY Right 11/18/2016   Procedure: Lower Extremity Angiography;  Surgeon: Renford Dills, MD;  Location: [facility] INVASIVE CV LAB;  Service: Cardiovascular;  Laterality: Right;   LOWER EXTREMITY ANGIOGRAPHY Right 12/09/2016   Procedure: Lower Extremity Angiography;  Surgeon: Renford Dills, MD;  Location: [facility] INVASIVE CV LAB;  Service: Cardiovascular;  Laterality:  Right;   LOWER EXTREMITY ANGIOGRAPHY Right 11/15/2019   Procedure: LOWER EXTREMITY ANGIOGRAPHY;  Surgeon: Renford Dills, MD;  Location: [facility] INVASIVE CV LAB;  Service: Cardiovascular;  Laterality: Right;   LOWER EXTREMITY ANGIOGRAPHY Right 12/01/2019   Procedure: Lower Extremity Angiography;  Surgeon: Annice Needy, MD;  Location: [facility] INVASIVE CV LAB;  Service: Cardiovascular;  Laterality: Right;   LOWER  EXTREMITY ANGIOGRAPHY Right 12/02/2019   Procedure: LOWER EXTREMITY ANGIOGRAPHY;  Surgeon: Renford Dills, MD;  Location: [facility] INVASIVE CV LAB;  Service: Cardiovascular;  Laterality: Right;   LOWER EXTREMITY ANGIOGRAPHY Right 03/23/2020   Procedure: Lower Extremity Angiography;  Surgeon: Renford Dills, MD;  Location: [facility] INVASIVE CV LAB;  Service: Cardiovascular;  Laterality: Right;   LOWER EXTREMITY ANGIOGRAPHY Right 09/25/2021   Procedure: Lower Extremity Angiography;  Surgeon: Renford Dills, MD;  Location: [facility] INVASIVE CV LAB;  Service: Cardiovascular;  Laterality: Right;   LOWER EXTREMITY ANGIOGRAPHY Right 09/26/2021   Procedure: Lower Extremity Angiography;  Surgeon: Annice Needy, MD;  Location: [facility] INVASIVE CV LAB;  Service: Cardiovascular;  Laterality: Right;   LOWER EXTREMITY ANGIOGRAPHY Right 04/08/2022   Procedure: Lower Extremity Angiography;  Surgeon: Renford Dills, MD;  Location: [facility] INVASIVE CV LAB;  Service: Cardiovascular;  Laterality: Right;   LOWER EXTREMITY INTERVENTION  11/18/2016   Procedure: Lower Extremity Intervention;  Surgeon: Renford Dills, MD;  Location: [facility] INVASIVE CV LAB;  Service: Cardiovascular;;   PERIPHERAL VASCULAR CATHETERIZATION Right 01/02/2015   Procedure: Lower Extremity Angiography;  Surgeon: Renford Dills, MD;  Location: [facility] INVASIVE CV LAB;  Service: Cardiovascular;  Laterality: Right;   PERIPHERAL VASCULAR CATHETERIZATION Right 06/18/2015   Procedure: Lower Extremity Angiography;  Surgeon: Annice Needy, MD;  Location: [facility] INVASIVE CV LAB;  Service: Cardiovascular;  Laterality: Right;   PERIPHERAL VASCULAR CATHETERIZATION  06/18/2015   Procedure: Lower Extremity Intervention;  Surgeon: Annice Needy, MD;  Location: [facility] INVASIVE CV LAB;  Service: Cardiovascular;;   PERIPHERAL VASCULAR CATHETERIZATION N/A 10/09/2015   Procedure: Abdominal Aortogram w/Lower Extremity;  Surgeon: Renford Dills, MD;  Location: [facility] INVASIVE CV LAB;   Service: Cardiovascular;  Laterality: N/A;   PERIPHERAL VASCULAR CATHETERIZATION  10/09/2015   Procedure: Lower Extremity Intervention;  Surgeon: Renford Dills, MD;  Location: [facility] INV[facility] LAB;  Service: Cardiovascular;;   PERIPHERAL VASCULAR CATHETERIZATION Right 10/10/2015   Procedure: Lower Extremity Angiography;  Surgeon: Annice Needy, MD;  Location: [facility] INV[facility] LAB;  Service: Cardiovascular;  Laterality: Right;   PERIPHERAL VASCULAR CATHETERIZATION Right 10/10/2015   Procedure: Lower Extremity Intervention;  Surgeon: Annice Needy, MD;  Location: [facility];  Service: Cardiovascular;  Laterality: Right;   PERIPHERAL VASCULAR CATHETERIZATION Right 12/03/2015   Procedure: Lower Extremity Angiography;  Surgeon: Annice Needy, MD;  Location: [facility];  Service: Cardiovascular;  Laterality: Right;   PERIPHERAL VASCULAR CATHETERIZATION Right 12/04/2015   Procedure: Lower Extremity Angiography;  Surgeon: Annice Needy, MD;  Location: [facility] LAB;  Service: Cardiovascular;  Laterality: Right;   PERIPHERAL VASCULAR CATHETERIZATION  12/04/2015   Procedure: Lower Extremity Intervention;  Surgeon: Annice Needy, MD;  Location: [facility] INV[facility] LAB;  Service: Cardiovascular;;   PERIPHERAL VASCULAR CATHETERIZATION Right 06/30/2016   Procedure: Lower Extremity Angiography;  Surgeon: Annice Needy, MD;  Location: [facility] INV[facility] LAB;  Service: Cardiovascular;  Laterality: Right;   PERIPHERAL VASCULAR CATHETERIZATION Right 06/25/2016   Procedure: Lower Extremity Angiography;  Surgeon: Renford Dills, MD;  Location: [facility] LAB;  Service: Cardiovascular;  Laterality: Right;   PERIPHERAL VASCULAR CATHETERIZATION Right 07/01/2016   Procedure: Lower Extremity Angiography;  Surgeon: Renford Dills, MD;  Location: [facility] IN[facility] LAB;  Service: Cardiovascular;  Laterality: Right;   PERIPHERAL VASCULAR CATHETERIZATION  07/01/2016   Procedure: Lower Extremity Intervention;   Surgeon: Renford Dills, MD;  Location: [facility] INVAS[facility] CV LAB;  Service: Cardiovascular;;   PERIPHERAL VASCULAR CATHETERIZATION Right 08/01/2016   Procedure: Lower Extremity Angiography;  Surgeon: Renford Dills, MD;  Location: [facility] INVASIVE CV LAB;  Service: Cardiovascular;  Laterality: Right;   PORTA CATH INSERTION N/A 10/05/2020   Procedure: PORTA CATH INSERTION;  Surgeon: Renford Dills, MD;  Location: [facility] INVASIVE CV LAB;  Service: Cardiovascular;  Laterality: N/A;   stent placement in right leg Right    VIDEO BRONCHOSCOPY N/A 09/14/2020   Procedure: VIDEO BRONCHOSCOPY WITH FLUORO;  Surgeon: Salena Saner, MD;  Location: [facility] ORS;  Service: Cardiopulmonary;  Laterality: N/A;   VIDEO BRONCHOSCOPY WITH ENDOBRONCHIAL ULTRASOUND N/A 09/14/2020   Procedure: VIDEO BRONCHOSCOPY WITH ENDOBRONCHIAL ULTRASOUND;  Surgeon: Salena Saner, MD;  Location: [facility] ORS;  Service: Cardiopulmonary;  Laterality: N/A;    SOCIAL HISTORY: Social History   Socioeconomic History   Marital status: Single    Spouse name: Not on file   Number of children: Not on file   Years of education: Not on file   Highest education level: Not on file  Occupational History   Occupation: unemployed  Tobacco Use   Smoking status: Former    Current packs/day: 0.00    Average packs/day: 2.0 packs/day for 38.0 years (76.0 ttl pk-yrs)    Types: Cigarettes    Start date: 12/19/1976    Quit date: 12/20/2014    Years since quitting: 8.2   Smokeless tobacco: Never   Tobacco comments:    quit smoking in 2017 most smoked 2   Vaping Use   Vaping status: Former  Substance and Sexual Activity   Alcohol use: Not Currently   Drug use: No   Sexual activity: Yes  Other Topics Concern   Not on file  Social History Narrative   Not on file   Social Determinants of Health   Financial Resource Strain: Low Risk  (12/29/2022)   Received from [facility], [facility]   Overall Financial  Resource Strain (CARDIA)    Difficulty of Paying Living Expenses: Not hard at all  Food Insecurity: No Food Insecurity (12/29/2022)   Received from [facility], [facility]   Hunger Vital Sign    Worried About Running Out of Food in the Last Year: Never true    Ran Out of Food in the Last Year: Never true  Transportation Needs: No Transportation Needs (12/29/2022)   Received from [facility], [facility]   [facility] - Transportation    In the past 12 months, has lack of transportation kept you from medical appointments or from getting medications?: No    Lack of Transportation (Non-Medical): No  Physical Activity: Not on file  Stress: Not on file  Social Connections: Not on file  Intimate Partner Violence: Not on file    FAMILY HISTORY: Family History  Problem Relation Age of Onset   Diabetes Mother    Hypertension Mother    Heart murmur Mother    Leukemia Mother    Throat cancer Maternal Grandmother    Colon cancer Neg  Hx    Breast cancer Neg Hx     ALLERGIES:  has No Known Allergies.  MEDICATIONS:  Current Outpatient Medications  Medication Sig Dispense Refill   aspirin EC 81 MG tablet Take 81 mg by mouth daily. Swallow whole.     clopidogrel (PLAVIX) 75 MG tablet TAKE 1 TABLET BY MOUTH DAILY 100 tablet 2   levothyroxine (SYNTHROID) 100 MCG tablet Take 1 tablet (100 mcg total) by mouth daily before breakfast. 90 tablet 0   levothyroxine (SYNTHROID) 50 MCG tablet Take 0.5 tablets (25 mcg total) by mouth daily before breakfast. Take with his levothyroxine. 30 tablet 0   simvastatin (ZOCOR) 40 MG tablet Take 40 mg by mouth at bedtime.     No current facility-administered medications for this visit.   Facility-Administered Medications Ordered in Other Visits  Medication Dose Route Frequency Provider Last Rate Last Admin   albuterol (PROVENTIL) (2.5 MG/3ML) 0.083% nebulizer solution 2.5 mg  2.5 mg  Nebulization Once Salena Saner, MD       durvalumab (IMFINZI) 1,000 mg in sodium chloride 0.9 % 100 mL chemo infusion  10 mg/kg (Treatment Plan Recorded) Intravenous Once [name], MD 120 mL/hr at 03/19/23 1044 1,000 mg at 03/19/23 1044   heparin lock flush 100 unit/mL  500 Units Intracatheter Once PRN [name], MD         PHYSICAL EXAMINATION: ECOG PERFORMANCE STATUS: 1 - Symptomatic but completely ambulatory Vitals:   03/19/23 0852  BP: (!) 149/75  Pulse: 79  Resp: 18  Temp: 97.6 F (36.4 C)  SpO2: 100%   Filed Weights   03/19/23 0852  Weight: 218 lb (98.9 kg)    Physical Exam Constitutional:      General: He is not in acute distress.    Appearance: He is obese. He is not diaphoretic.     Comments: Patient walks with a cane  HENT:     Head: Normocephalic and atraumatic.  Eyes:     General: No scleral icterus. Cardiovascular:     Rate and Rhythm: Normal rate and regular rhythm.     Heart sounds: No murmur heard. Pulmonary:     Effort: Pulmonary effort is normal. No respiratory distress.  Abdominal:     General: There is no distension.     Palpations: Abdomen is soft.     Tenderness: There is no abdominal tenderness.  Musculoskeletal:     Cervical back: Normal range of motion and neck supple.  Skin:    General: Skin is warm and dry.     Findings: No erythema.  Neurological:     Mental Status: He is alert and oriented to person, place, and time. Mental status is at baseline.     Cranial Nerves: No cranial nerve deficit.     Motor: No abnormal muscle tone.  Psychiatric:        Mood and Affect: Mood and affect normal.      LABORATORY DATA:  I have reviewed the data as listed    Latest Ref Rng & Units 03/19/2023    8:35 AM 03/05/2023    8:44 AM 02/19/2023    8:47 AM  CBC  WBC 4.0 - 10.5 K/uL 3.5  5.3  7.5   Hemoglobin 13.0 - 17.0 g/dL 54.0  98.1  19.1   Hematocrit 39.0 - 52.0 % 38.7  41.4  42.4   Platelets 150 - 400 K/uL 222  233  254       Latest  Ref Rng &  Units 03/19/2023    8:35 AM 03/05/2023    8:44 AM 02/19/2023    8:47 AM  CMP  Glucose 70 - 99 mg/dL 295  621  308   BUN 8 - 23 mg/dL 19  15  13    Creatinine 0.61 - 1.24 mg/dL 6.57  8.46  9.62   Sodium 135 - 145 mmol/L 138  139  138   Potassium 3.5 - 5.1 mmol/L 3.5  3.9  3.7   Chloride 98 - 111 mmol/L 108  107  105   CO2 22 - 32 mmol/L 24  24  24    Calcium 8.9 - 10.3 mg/dL 9.0  8.9  9.3   Total Protein 6.5 - 8.1 g/dL 6.7  6.7  7.4   Total Bilirubin 0.3 - 1.2 mg/dL 0.5  0.5  0.9   Alkaline Phos 38 - 126 U/L 65  71  74   AST 15 - 41 U/L 22  24  25    ALT 0 - 44 U/L 21  20  20       RADIOGRAPHIC STUDIES: I have personally reviewed the radiological images as listed and agreed with the findings in the report. 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 * Tracking Code: BO * EXAM: CT CHEST, ABDOMEN, AND PELVIS WITH CONTRAST TECHNIQUE: Multidetector CT imaging of the chest, abdomen and pelvis was performed following the standard protocol during bolus administration of intravenous contrast. RADIATION DOSE REDUCTION: This exam was performed according to the departmental dose-optimization program which includes automated exposure control, adjustment of the mA and/or kV according to patient size and/or use of iterative reconstruction technique. CONTRAST:  OMNIPAQUE IOHEXOL 350 MG/ML SOLN COMPARISON:  11/25/2022 FINDINGS: CT CHEST FINDINGS Cardiovascular: Aortic atherosclerosis. Right Port-A-Cath tip high right atrium. Normal heart size, without pericardial effusion. Lad and right coronary artery calcification. No central pulmonary embolism, on this non-dedicated study. Mediastinum/Nodes: No supraclavicular adenopathy. No mediastinal or hilar adenopathy. Lungs/Pleura: trace right pleural fluid or thickening is new or increased. Patent airways. Mild centrilobular emphysema. Similar  appearance of central right upper lobe and perihilar consolidation with volume loss and traction bronchiectasis, consistent with radiation fibrosis. No local recurrence. The interstitial opacities including within the left lower lobe have resolved. Musculoskeletal: No acute osseous abnormality. CT ABDOMEN PELVIS FINDINGS Hepatobiliary: Mild hepatic steatosis without focal liver lesion or biliary abnormality. Pancreas: Normal, without mass or ductal dilatation. Spleen: Normal in size, without focal abnormality. Adrenals/Urinary Tract: Normal adrenal glands. Normal kidneys, without hydronephrosis. Normal urinary bladder. Stomach/Bowel: Normal stomach, without wall thickening. Extensive colonic diverticulosis. Normal terminal ileum and appendix. Normal small bowel. Vascular/Lymphatic: Aortic atherosclerosis. No abdominopelvic adenopathy. Right superficial femoral artery grafts are incompletely imaged but again appear occluded. Reproductive: Normal prostate.  Small bilateral hydroceles. Other: No significant free fluid. No evidence of omental or peritoneal disease. Musculoskeletal: Partial degenerative fusion of the bilateral sacroiliac joints. Lumbar spine fixation at L5-S1. IMPRESSION: 1. 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 2. Trace right pleural fluid or thickening, new or minimally increased. 3. Incidental findings, including: Aortic atherosclerosis (ICD10-I70.0), coronary artery atherosclerosis and emphysema (ICD10-J43.9). Hepatic steatosis. Electronically Signed   By: Jeronimo Greaves M.D.   On: 03/16/2023 14:43   MR BRAIN W WO CONTRAST  Result Date: 02/14/2023 CLINICAL DATA:  Brain/CNS neoplasm, assess treatment response. EXAM: MRI HEAD WITHOUT AND WITH CONTRAST TECHNIQUE: Multiplanar, multiecho pulse  sequences of the brain and surrounding structures were obtained without and with intravenous contrast. CONTRAST:  10mL GADAVIST GADOBUTROL 1  MMOL/ML IV SOLN COMPARISON:  MRI head November 05, 2022. FINDINGS: Brain: Interval decrease in multiple enhancing lesions. Posterior left temporal lobe lesion now measures 18 mm (previously 21 mm). High left parietal lobe lesion now measures 11 mm (previously 14 mm). Right occipital lobe lesion measures 11 mm (previously 17 mm). Left cerebellar lesion is now punctate (previously 7 mm). No new enhancing lesions identified. Edema surrounding lesions is also decreased. No progressive mass effect. No midline shift. No evidence of acute infarct, mass occupying acute hemorrhage, or hydrocephalus. Vascular: Major arterial flow voids are maintained at the skull base. Skull and upper cervical spine: Normal marrow signal. Sinuses/Orbits: Clear sinuses.  No acute orbital findings. Other: No mastoid effusions. IMPRESSION: Interval decrease in all of the metastatic lesions, detailed above. No new lesions identified. Electronically Signed   By: Feliberto Harts M.D.   On: 02/14/2023 14:52   VAS [place] ABI WITH/WO TBI  Result Date: 01/08/2023  LOWER EXTREMITY DOPPLER STUDY Patient Name:  Joshua [name]  Date of Exam:   01/05/2023 Medical Rec #: [id number]            Accession #:    [id number] Date of Birth: 11-17-1955           Patient Gender: M Patient Age:   21 years Exam Location:  [facility] Procedure:      VAS [place] ABI WITH/WO TBI Referring Phys: Sheppard Plumber --------------------------------------------------------------------------------  Indications: Claudication, peripheral artery disease, and Routine f/u. High Risk Factors: Hyperlipidemia. Other Factors: ASO s/p multiple interventions and bypass.                11/15/2019 Right thrombectomy of Fempop insitu graft with added                stent throughout.                 12/01/2019: Aortogram and Selctive Right Lower Extremity                Angiogram. 6 mg of TPA delivered in a lysis catheterfrom the                Origin of the Right Femoral -Popliteal  bypass down the proximal                ATA with placement of the lysis.                 12/02/2019: PTA of the Right Posterior Tibial Artery. PTA of the                Right Peroneal Artery. Mechanical Thrombectomy of the Right                Femoral -Popliteal Bypass graft. Mechanical Thrombectomy of the                Right Tibioperoneal Trunk and Posterior Tibial Artery.                 09/25/2021: Right Lower Extremity Angiography third order                catheter placement. PTA of the proximal peroneal and                tibioperoneal trunk to 2.5 mm with an ultra score balloon. PTA of  the Femoral Artery and the proximal anstomosis of the bypass                graft to 4 mm with an ultra score balloon. Administration of 5 mg                of tpa throughout the entire bypass and in the peroneal Artery.                Initiation of continuous tpa infusion Right lower Extremity for                limb salvage. Insertion of a triple lumen catheter Left CFV with                [place] and fluoroscopic guidance.                 09/26/2021: Right LE Angiogram. PTA of the Right Posterior Tibial                Artery with 2.5 mm diameter by 30 cm length angioplasty balloon.                PTA of the distal bypass anastomosis and distal bypass graft                analogous to the Popliteal artery with 4 mm diameter by 8 cm                length Lutonix drug coated angioplasty balloon.                04/08/2022 right thrombectomy of the Deep profunda.  Vascular Interventions: Hx of multiple right lower extremity PTA's/stents.                         09/10/2016 Right femoral to below the knee popliteal                         artery bypass graft placement. 5/01 Right bypass graft &                         mid popliteal artery PTA & coil embolectomy of large                         branch of bypass graft. 12/09/16 Right distal bypass                         graft PTA/stent and right posterior tibial artery PTA.                          03/23/2020 Rt PTA thrombectomy. Rt Profunda fem artery to                         PTA bypass. Comparison Study: 09/05/2022 Performing Technologist: Debbe Bales RVS  Examination Guidelines: A complete evaluation includes at minimum, Doppler waveform signals and systolic blood pressure reading at the level of bilateral brachial, anterior tibial, and posterior tibial arteries, when vessel segments are accessible. Bilateral testing is considered an integral part of a complete examination. Photoelectric Plethysmograph (PPG) waveforms and toe systolic pressure readings are included as required and additional duplex testing as needed. Limited examinations for reoccurring indications may be performed as noted.  ABI Findings: +---------+------------------+-----+------------------+--------+ Right  Rt Pressure (mmHg)IndexWaveform          Comment  +---------+------------------+-----+------------------+--------+ Brachial 155                                               +---------+------------------+-----+------------------+--------+ ATA                             Non pulsatile flow         +---------+------------------+-----+------------------+--------+ PTA      69                0.45 monophasic                 +---------+------------------+-----+------------------+--------+ PERO     56                0.36 monophasic                 +---------+------------------+-----+------------------+--------+ Great Toe66                0.43 Abnormal                   +---------+------------------+-----+------------------+--------+ +---------+------------------+-----+---------+-------+ Left     Lt Pressure (mmHg)IndexWaveform Comment +---------+------------------+-----+---------+-------+ Brachial 155                                     +---------+------------------+-----+---------+-------+ ATA      158               1.02 triphasic         +---------+------------------+-----+---------+-------+ PTA      159               1.03 triphasic        +---------+------------------+-----+---------+-------+ Great Toe158               1.02 Normal           +---------+------------------+-----+---------+-------+ +-------+-----------+-----------+------------+------------+ ABI/TBIToday's ABIToday's TBIPrevious ABIPrevious TBI +-------+-----------+-----------+------------+------------+ Right  .45        .43        .63         .31          +-------+-----------+-----------+------------+------------+ Left   1.03       1.02       1.11        .91          +-------+-----------+-----------+------------+------------+ Bilateral TBIs appear increased compared to prior study on 09/05/2022. Left ABIs appear essentially unchanged compared to prior study on 09/05/2022. The Right ABIs appear to be decreased compared to prior study on 09/05/2022.  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 The right toe-brachial index is abnormal. Imaged the Right ATA and Peroneal Artery at the level of the Ankle; Non pulsatile flow seen in the Right ATA and Monophasic flow seen in the Peroneal Artery. Left: Resting left ankle-brachial index is within normal range. The left toe-brachial index is normal. *See table(s) above for measurements and observations.   Electronically signed by Levora Dredge MD on 01/08/2023 at 3:23:11 PM.    Final

## 2023-03-19 NOTE — Assessment & Plan Note (Signed)
Durvalumab treatment as planned.  

## 2023-03-19 NOTE — Patient Instructions (Signed)

## 2023-03-20 LAB — T4: T4, Total: 10.6 ug/dL (ref 4.5–12.0)

## 2023-03-21 ENCOUNTER — Other Ambulatory Visit: Payer: Self-pay

## 2023-04-02 ENCOUNTER — Inpatient Hospital Stay: Payer: Medicare Other | Attending: Oncology

## 2023-04-02 ENCOUNTER — Inpatient Hospital Stay: Payer: Medicare Other

## 2023-04-02 ENCOUNTER — Inpatient Hospital Stay (HOSPITAL_BASED_OUTPATIENT_CLINIC_OR_DEPARTMENT_OTHER): Payer: Medicare Other | Admitting: Oncology

## 2023-04-02 ENCOUNTER — Inpatient Hospital Stay: Payer: Medicare Other | Admitting: Oncology

## 2023-04-02 ENCOUNTER — Encounter: Payer: Self-pay | Admitting: Oncology

## 2023-04-02 VITALS — BP 118/78 | HR 100 | Temp 99.4°F | Resp 18 | Wt 217.5 lb

## 2023-04-02 DIAGNOSIS — Z79620 Long term (current) use of immunosuppressive biologic: Secondary | ICD-10-CM | POA: Diagnosis not present

## 2023-04-02 DIAGNOSIS — C3411 Malignant neoplasm of upper lobe, right bronchus or lung: Secondary | ICD-10-CM | POA: Diagnosis present

## 2023-04-02 DIAGNOSIS — C3491 Malignant neoplasm of unspecified part of right bronchus or lung: Secondary | ICD-10-CM

## 2023-04-02 DIAGNOSIS — C7931 Secondary malignant neoplasm of brain: Secondary | ICD-10-CM

## 2023-04-02 DIAGNOSIS — E032 Hypothyroidism due to medicaments and other exogenous substances: Secondary | ICD-10-CM

## 2023-04-02 DIAGNOSIS — Z79890 Hormone replacement therapy (postmenopausal): Secondary | ICD-10-CM | POA: Diagnosis not present

## 2023-04-02 DIAGNOSIS — Z5112 Encounter for antineoplastic immunotherapy: Secondary | ICD-10-CM

## 2023-04-02 LAB — CBC WITH DIFFERENTIAL/PLATELET
Abs Immature Granulocytes: 0.02 10*3/uL (ref 0.00–0.07)
Basophils Absolute: 0.1 10*3/uL (ref 0.0–0.1)
Basophils Relative: 2 %
Eosinophils Absolute: 0.3 10*3/uL (ref 0.0–0.5)
Eosinophils Relative: 6 %
HCT: 42 % (ref 39.0–52.0)
Hemoglobin: 13.4 g/dL (ref 13.0–17.0)
Immature Granulocytes: 0 %
Lymphocytes Relative: 23 %
Lymphs Abs: 1.1 10*3/uL (ref 0.7–4.0)
MCH: 27.2 pg (ref 26.0–34.0)
MCHC: 31.9 g/dL (ref 30.0–36.0)
MCV: 85.4 fL (ref 80.0–100.0)
Monocytes Absolute: 0.6 10*3/uL (ref 0.1–1.0)
Monocytes Relative: 12 %
Neutro Abs: 2.8 10*3/uL (ref 1.7–7.7)
Neutrophils Relative %: 57 %
Platelets: 260 10*3/uL (ref 150–400)
RBC: 4.92 MIL/uL (ref 4.22–5.81)
RDW: 13.4 % (ref 11.5–15.5)
WBC: 4.8 10*3/uL (ref 4.0–10.5)
nRBC: 0 % (ref 0.0–0.2)

## 2023-04-02 LAB — COMPREHENSIVE METABOLIC PANEL
ALT: 17 U/L (ref 0–44)
AST: 24 U/L (ref 15–41)
Albumin: 4 g/dL (ref 3.5–5.0)
Alkaline Phosphatase: 80 U/L (ref 38–126)
Anion gap: 9 (ref 5–15)
BUN: 9 mg/dL (ref 8–23)
CO2: 25 mmol/L (ref 22–32)
Calcium: 9.1 mg/dL (ref 8.9–10.3)
Chloride: 104 mmol/L (ref 98–111)
Creatinine, Ser: 1.08 mg/dL (ref 0.61–1.24)
GFR, Estimated: >60 mL/min (ref >60)
Glucose, Bld: 118 mg/dL — ABNORMAL HIGH (ref 70–99)
Potassium: 3.7 mmol/L (ref 3.5–5.1)
Sodium: 138 mmol/L (ref 135–145)
Total Bilirubin: 0.4 mg/dL (ref 0.3–1.2)
Total Protein: 7 g/dL (ref 6.5–8.1)

## 2023-04-02 LAB — TSH: TSH: 17.134 u[IU]/mL — ABNORMAL HIGH (ref 0.350–4.500)

## 2023-04-02 MED ORDER — SODIUM CHLORIDE 0.9 % IV SOLN
Freq: Once | INTRAVENOUS | Status: AC
  Filled 2023-04-02: qty 250

## 2023-04-02 MED ORDER — SODIUM CHLORIDE 0.9 % IV SOLN
10.0000 mg/kg | Freq: Once | INTRAVENOUS | Status: AC
  Administered 2023-04-02: 1000 mg via INTRAVENOUS
  Filled 2023-04-02: qty 20

## 2023-04-02 MED ORDER — HEPARIN SOD (PORK) LOCK FLUSH 100 UNIT/ML IV SOLN
500.0000 [IU] | Freq: Once | INTRAVENOUS | Status: AC | PRN
  Administered 2023-04-02: 500 [IU]
  Filled 2023-04-02: qty 5

## 2023-04-02 NOTE — Assessment & Plan Note (Signed)
Durvalumab treatment as planned.  

## 2023-04-02 NOTE — Patient Instructions (Signed)
[facility] [place] [facility]  Discharge Instructions: Thank you for choosing [facility] to provide your oncology and hematology care.  If you have a lab appointment with the Cancer Center, please go directly to the Cancer Center and check in at the registration area.  Wear comfortable clothing and clothing appropriate for easy access to any Portacath or PICC line.   We strive to give you quality time with your provider. You may need to reschedule your appointment if you arrive late (15 or more minutes).  Arriving late affects you and other patients whose appointments are after yours.  Also, if you miss three or more appointments without notifying the office, you may be dismissed from the clinic at the provider's discretion.      For prescription refill requests, have your pharmacy contact our office and allow 72 hours for refills to be completed.    Today you received the following chemotherapy and/or immunotherapy agents Imfinzi        To help prevent nausea and vomiting after your treatment, we encourage you to take your nausea medication as directed.  BELOW ARE SYMPTOMS THAT SHOULD BE REPORTED IMMEDIATELY: *FEVER GREATER THAN 100.4 F (38 C) OR HIGHER *CHILLS OR SWEATING *NAUSEA AND VOMITING THAT IS NOT CONTROLLED WITH YOUR NAUSEA MEDICATION *UNUSUAL SHORTNESS OF BREATH *UNUSUAL BRUISING OR BLEEDING *URINARY PROBLEMS (pain or burning when urinating, or frequent urination) *BOWEL PROBLEMS (unusual diarrhea, constipation, pain near the anus) TENDERNESS IN MOUTH AND THROAT WITH OR WITHOUT PRESENCE OF ULCERS (sore throat, sores in mouth, or a toothache) UNUSUAL RASH, SWELLING OR PAIN  UNUSUAL VAGINAL DISCHARGE OR ITCHING   Items with * indicate a potential emergency and should be followed up as soon as possible or go to the Emergency Department if any problems should occur.  Please show the CHEMOTHERAPY ALERT CARD or IMMUNOTHERAPY ALERT CARD at check-in to  the Emergency Department and triage nurse.  Should you have questions after your visit or need to cancel or reschedule your appointment, please contact [facility] [place] [facility]  [phone] and follow the prompts.  Office hours are 8:00 a.m. to 4:30 p.m. Monday - Friday. Please note that voicemails left after 4:00 p.m. may not be returned until the following business day.  We are closed weekends and major holidays. You have access to a nurse at all times for urgent questions. Please call the main number to the clinic [phone] and follow the prompts.  For any non-urgent questions, you may also contact your provider using MyChart. We now offer e-Visits for anyone 18 and older to request care online for non-urgent symptoms. For details visit [email or web address].   Also download the MyChart app! Go to the app store, search "MyChart", open the app, select [facility] [place], and log in with your MyChart username and password.    

## 2023-04-02 NOTE — Assessment & Plan Note (Signed)
 continue levothyroxine to 125 mcg daily

## 2023-04-02 NOTE — Assessment & Plan Note (Signed)
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  May 2024 CT scan showed Improving bilateral scattered interstitial and ground-glass opacities. Discussed with pulmonology Dr. Jayme Cloud. 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  Labs are reviewed and discussed with patient. Proceed with Durvalumab.

## 2023-04-02 NOTE — Assessment & Plan Note (Signed)
Status post radiation,  02/19/22 MRI showed decreased brain lesion size and edema.  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 Mitzi Hansen.  MRI on 02/05/23 stable improved - MRI surveillance in Oct 2024

## 2023-04-02 NOTE — Progress Notes (Signed)
Hematology/Oncology Progress note [phone] [phone]) [phone]    CHIEF COMPLAINTS/REASON FOR VISIT:  Follow up for lung cancer   ASSESSMENT & PLAN:   Cancer Staging  Squamous cell carcinoma of right lung (HCC) Staging form: Lung, AJCC 8th Edition - Clinical stage from 09/20/2020: Stage IV (cT1b, cN2, cM1) - Signed by [name], MD on 12/13/2021   Squamous cell carcinoma of right lung (HCC) Durvalumab was previously held due to pneumonitis.  May 2024 CT scan showed Improving bilateral scattered interstitial and ground-glass opacities. Discussed with pulmonology Dr. Jayme Cloud. 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  Labs are reviewed and discussed with patient. Proceed with Durvalumab.    Hypothyroidism due to medication continue levothyroxine to 125 mcg daily    Encounter for antineoplastic immunotherapy Durvalumab treatment as planned.   Malignant neoplasm metastatic to brain [facility]) Status post radiation,  02/19/22 MRI showed decreased brain lesion size and edema.  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 Mitzi Hansen.  MRI on 02/05/23 stable improved - MRI surveillance in Oct 2024    Orders Placed This Encounter  Procedures   T4    Standing Status:   Future    Standing Expiration Date:   04/15/2024   TSH    Standing Status:   Future    Standing Expiration Date:   04/15/2024   CBC with Differential    Standing Status:   Future    Standing Expiration Date:   04/15/2024   Comprehensive metabolic panel    Standing Status:   Future    Standing Expiration Date:   04/15/2024   T4    Standing  Status:   Future    Standing Expiration Date:   04/29/2024   TSH    Standing Status:   Future    Standing Expiration Date:   04/29/2024   CBC with Differential    Standing Status:   Future    Standing Expiration Date:   04/29/2024   Comprehensive metabolic panel    Standing Status:   Future    Standing Expiration Date:   04/29/2024   T4    Standing Status:   Future    Standing Expiration Date:   05/13/2024   TSH    Standing Status:   Future    Standing Expiration Date:   05/13/2024   CBC with Differential    Standing Status:   Future    Standing Expiration Date:   05/13/2024   Comprehensive metabolic panel    Standing Status:   Future    Standing Expiration Date:   05/13/2024   T4    Standing Status:   Future    Standing Expiration Date:   05/27/2024   TSH    Standing Status:   Future    Standing Expiration Date:   05/27/2024   CBC with Differential    Standing Status:   Future    Standing Expiration Date:   05/27/2024   Comprehensive metabolic panel    Standing Status:   Future    Standing Expiration Date:   05/27/2024    Follow up 2 weeks lab MD Durvalumab All questions were answered. The patient knows to call the  clinic with any problems, questions or concerns.  [name], MD, PhD [facility] Hematology Oncology 04/02/2023       HISTORY OF PRESENTING ILLNESS:  Joshua [name] is a 67 y.o. male who has above history reviewed by me today presents for follow up visit for Stage IV lung squamous cell carcinoma. Oncology History  Squamous cell carcinoma of right lung (HCC)  09/18/2020 Imaging   PET scan showed right upper lobe nodule maximal SUV 11.5.  Hypermetabolic cluster right paratracheal adenopathy with maximum SUV up to 12.  No findings of metastatic disease to the neck/abdomen/pelvis or skeleton.   09/20/2020 Initial Diagnosis   Squamous cell carcinoma of right lung (HCC) NGS showed PD-L1 TPS 95%, LRP1B S956fs, PIK3CA E545K, RB1 c1422-2A>T, RIT1 T83_ A84del,  STK11 P26fs, TPS53 R248W, TMB 19.2mut/mb, MS stable.    09/20/2020 Cancer Staging   Staging form: Lung, AJCC 8th Edition - Clinical stage from 09/20/2020: Stage IV (cT1b, cN2, cM1) - Signed by [name], MD on 12/13/2021   10/03/2020 Imaging   MRI brain with and without contrast showed no evidence of intracranial metastatic disease. Well-defined round lesion within the left parotid tail measuring approximately 2.3 x 1.8 x 1.8 cm   10/09/2020 - 11/27/2020 Radiation Therapy   concurrent chemoradiation   10/10/2020 - 10/31/2020 Chemotherapy   10/10/2020 concurrent chemoradiation.  carboplatin AUC of 2 and Taxol 45 mg/m2 x 4.  Additional chemotherapy was held due to thrombocytopenia, neutropenia       12/31/2020 - 03/21/2022 Chemotherapy   Maintenance  Durvalumab q14d      12/31/2020 -  Chemotherapy   Patient is on Treatment Plan : LUNG Durvalumab (10) q14d     03/28/2021 Imaging   CT without contrast showed new linear right perihilar consolidation with associated traction bronchiectasis.  Likely postradiation changes.  Additional new peribronchial vascular/nodular groundglass opacities with areas of consolidation or seen primarily in the right upper and lower lobes, more distant from expected radiation fields. 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  Per my discussion with pulmonology Dr. 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     05/09/2021 Imaging   Repeat CT chest without contrast showed evolving radiation changes in the right perihilar region likely accounting for interval increase to thickening of the posterior wall of the right upper lobe bronchus.  The more peripheral right lung groundglass opacities seen on most recent study have generally improved with exception of one peripherally right upper lobe which appears new.  These are likely inflammatory.  No  findings highly suspicious for local recurrence.   05/30/2021 - 01/10/2022 Chemotherapy   Durvalumab q14d     10/07/2021 Imaging   MRI brain with and without contrast showed 2 contrast-enhancing lesions, most consistent with metastatic disease.  Largest lesion is in the left frontal lobe and measures up to 1.3 cm with a large amount of surrounding edema and 9 mm rightward midline shift.  The other enhancing lesion was 4 mm in the right occipital lobe with a small amount of surrounding edema.  No acute or chronic hemorrhage.   10/07/2021 Progression   Headache after colonoscopy, progressive difficulty using her right arm.  Progressively worsening of speech. Stage IV lung cancer with brain metastasis.  started on Dexamethasone.  He was seen by neurosurgery and radiation oncology.  Recommendation is to proceed with SRS.  Patient was discharged with dexamethasone   10/16/2021  Imaging   PET scan showed postradiation changes in the right supra hilar lung without evidence of local recurrence.  No evidence of lung cancer recurrence or metastatic disease.  Single focus of intense metabolic activity associated with posterior left 11th rib likely secondary to recent fall/posttraumatic metabolic changes.  Frontal  uptake in the left frontal lobe corresponding to the known metastatic lesion in the brain   10/29/2021 -  Radiation Therapy   Brain Radiation [facility]   12/10/2021 Imaging   Brain MRI: Continued interval decrease in size of a left frontal lobe metastasis, now measuring 10 mm. Moderate surrounding edema has also decreased.  3-4 mm metastasis within the right occipital lobe, not significantly changed in size. There is now a small focus of central non enhancement within this lesion. Increased edema. 3 mm enhancing metastasis within the high left parietal lobe. In retrospect, this was likely present as a subtle punctate focus of enhancement on the prior brain MRI of 10/22/2021.   01/18/2022 Imaging   CT chest  abdomen pelvis w contrast 1. Stable post treatment changes in the right upper lobe medially with dense radiation fibrosis but no findings to suggest local recurrent tumor. 2. Significant progression of ill-defined ground-glass opacity, interstitial thickening and airspace nodularity in the right upper lobe and upper aspect of the right lower lobe. Findings could reflect an inflammatory or atypical infectious process, drug induced pneumonitis or interstitial spread of tumor.3. No mediastinal or hilar mass or adenopathy. 4. No findings for abdominal/pelvic metastatic disease or osseous metastatic disease.   02/19/2022 Imaging   MRI brain w wo contrast showed Three metastatic deposits in the brain appear smaller. Decreased edema in the left parietal lobe. No new lesions    04/04/2022 Imaging   CT chest w contrast 1. Stable radiation changes in the right perihilar region without evidence of local recurrence. 2. Interval improvement in the patchy ground-glass opacities previously demonstrated in both lungs, most consistent with a resolving infectious or inflammatory process (including immunotherapy-related pneumonitis). 3. No evidence of metastatic disease.   05/14/2022 Miscellaneous   He had a mechanical fall and fractured his left elbow. xrays showed a left elbow fracture  05/26/22 seen by orthopedic surgeon, cast applied.    05/27/2022 Imaging   MRI brain w wo contrast 1. All three known brain metastases have enlarged since August, with increasing regional edema. And there is a small new left anterior cerebellar 4-5 mm metastasis (was questionably punctate on the May MRI). This constellation could reflect a combination of True Progression (Left Cerebellar lesion) and pseudoprogression/radionecrosis (3 existing mets).    2. No significant intracranial mass effect. 3. Underlying moderate cerebral white matter signal changes.   05/30/2022 Imaging   CT chest abdomen pelvis w contrast  1. Stable  examination demonstrating chronic postradiation mass-like fibrosis in the right lung with resolving postradiation pneumonitis in the adjacent lung parenchyma. There is a stable small right pleural effusion, but no definitive findings to suggest residual or recurrent disease on today's examination. 2. No signs of metastatic disease in the abdomen or pelvis.  3. Aortic atherosclerosis, in addition to two-vessel coronary artery disease. Please note that although the presence of coronary artery calcium documents the presence of coronary artery disease, the severity of this disease and any potential stenosis cannot be assessed on this non-gated CT examination. Assessment for potential risk factor modification, dietary therapy or pharmacologic therapy may be warranted, if clinically indicated. 4. Hepatic steatosis. 5. Colonic diverticulosis without evidence of acute diverticulitis at this  time. 6. Incompletely imaged but apparently occluded vascular graft in the superior aspect of the right anterior thigh. Referral to vascular specialist for further clinical evaluation should be considered if clinically appropriate. 7. Additional incidental findings, as above.   07/22/2022 Imaging   MRI brain  1. No new or enlarging lesions. No intracranial disease progression. 2. Decreased size of treated lesions of the left frontoparietal junction and left postcentral gyrus. 3. Unchanged lesions in the left cerebellum and right occipital lobe.   10/15/2022 Imaging   MRI brain w wo contrast showed Interval increase in size of multiple intraparenchymal lesions with worsened surrounding vasogenic edema. 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 No new lesions   11/05/2022 Imaging   MRI brain w wo contrast  1. Likely slight increase and right occipital and left cerebellar lesions. Otherwise, unchanged metastases, as above. No new lesions  identified. 2. Partially imaged large probable lymph node in the left upper neck, apparent on prior PET CT.   11/13/2022 -  Radiation Therapy   Each of the sites were treated with SRS/SBRT/SRT-IMRT technique to 20 Gy in 1 fraction to each of the following sites: PTV_2_LCerebel_73mm PTV_3_RCerebel_59mm PTV_4_ROccip_52mm PTV_5_LParietal_21mm   11/27/2022 Imaging   CT chest abdomen pelvis w contrast showed  Improving bilateral scattered interstitial and ground-glass opacities. Small amount of residual areas in the extreme left lower lobe inferiorly. This specific area is slightly increased from previous but overall the amount of opacities is significantly improved. Recommend continued follow-up. No developing new mass lesion, fluid collection or lymph node enlargement. Stable posttreatment changes right perihilar in the lung. Fatty liver infiltration. Colonic diverticulosis. Improved tiny right pleural effusion   02/05/2023 Imaging   MRI brain with and without contrast Interval decrease in all of the metastatic lesions, detailed above.No new lesions identified.    Imaging   CT chest abdomen pelvis w contrast showed  1. 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 2. Trace right pleural fluid or thickening, new or minimally increased. 3. Incidental findings, including: Aortic atherosclerosis (ICD10-I70.0), coronary artery atherosclerosis and emphysema (ICD10-J43.9). Hepatic steatosis.    Today he feels well.Intermittent headache, is better. denies any focal deficits. chronic SOB,not worse.  occasional cough. No new complaints.    Review of Systems  Constitutional:  Positive for fatigue. Negative for appetite change, chills, fever and unexpected weight change.  HENT:   Negative for hearing loss and voice change.   Eyes:  Negative for eye problems and icterus.  Respiratory:  Negative for chest tightness, cough and shortness of  breath.   Cardiovascular:  Negative for chest pain and leg swelling.  Gastrointestinal:  Negative for abdominal distention and abdominal pain.  Endocrine: Negative for hot flashes.  Genitourinary:  Negative for difficulty urinating, dysuria and frequency.   Musculoskeletal:  Negative for arthralgias.  Skin:  Negative for itching and rash.  Neurological:  Positive for extremity weakness. Negative for headaches, light-headedness, numbness and speech difficulty.  Hematological:  Negative for adenopathy. Does not bruise/bleed easily.  Psychiatric/Behavioral:  Negative for confusion.       MEDICAL HISTORY:  Past Medical History:  Diagnosis Date   Allergy    Cancer (HCC)    lung   COPD (chronic obstructive pulmonary disease) (HCC)    Dyspnea    former smoker. only doe   History of kidney stones    Hypertension    Peripheral vascular disease (HCC)  Pre-diabetes    Sleep apnea     SURGICAL HISTORY: Past Surgical History:  Procedure Laterality Date   ANTERIOR CERVICAL DECOMP/DISCECTOMY FUSION N/A 03/14/2020   Procedure: ANTERIOR CERVICAL DECOMPRESSION/DISCECTOMY FUSION 3 LEVELS C4-7;  Surgeon: Venetia Night, MD;  Location: [facility] ORS;  Service: Neurosurgery;  Laterality: N/A;   BACK SURGERY  2011   CENTRAL LINE INSERTION Right 09/10/2016   Procedure: CENTRAL LINE INSERTION;  Surgeon: Renford Dills, MD;  Location: [facility] ORS;  Service: Vascular;  Laterality: Right;   CERVICAL FUSION     C4-c7 on Mar 14, 2020   COLONOSCOPY  2013 ?   COLONOSCOPY N/A 09/09/2021   Procedure: COLONOSCOPY;  Surgeon: Jaynie Collins, DO;  Location: [facility] ENDOSCOPY;  Service: Gastroenterology;  Laterality: N/A;   CORONARY ANGIOPLASTY     ESOPHAGOGASTRODUODENOSCOPY N/A 09/09/2021   Procedure: ESOPHAGOGASTRODUODENOSCOPY (EGD);  Surgeon: Jaynie Collins, DO;  Location: [facility] ENDOSCOPY;  Service: Gastroenterology;  Laterality: N/A;   FEMORAL-POPLITEAL BYPASS GRAFT Right 09/10/2016   Procedure:  BYPASS GRAFT FEMORAL-POPLITEAL ARTERY ( BELOW KNEE );  Surgeon: Renford Dills, MD;  Location: [facility] ORS;  Service: Vascular;  Laterality: Right;   FEMORAL-TIBIAL BYPASS GRAFT Right 03/23/2020   Procedure: BYPASS GRAFT RIGHT FEMORAL- DISTAL TIBIAL ARTERY WITH DISTAL FLOW GRAFT;  Surgeon: Renford Dills, MD;  Location: [facility] ORS;  Service: Vascular;  Laterality: Right;   LEFT HEART CATH AND CORONARY ANGIOGRAPHY Left 05/16/2021   Procedure: LEFT HEART CATH AND CORONARY ANGIOGRAPHY;  Surgeon: Armando Reichert, MD;  Location: [facility] INVASIVE CV LAB;  Service: Cardiovascular;  Laterality: Left;   LOWER EXTREMITY ANGIOGRAPHY Right 11/18/2016   Procedure: Lower Extremity Angiography;  Surgeon: Renford Dills, MD;  Location: [facility] INVASIVE CV LAB;  Service: Cardiovascular;  Laterality: Right;   LOWER EXTREMITY ANGIOGRAPHY Right 12/09/2016   Procedure: Lower Extremity Angiography;  Surgeon: Renford Dills, MD;  Location: [facility] INVASIVE CV LAB;  Service: Cardiovascular;  Laterality: Right;   LOWER EXTREMITY ANGIOGRAPHY Right 11/15/2019   Procedure: LOWER EXTREMITY ANGIOGRAPHY;  Surgeon: Renford Dills, MD;  Location: [facility] INVASIVE CV LAB;  Service: Cardiovascular;  Laterality: Right;   LOWER EXTREMITY ANGIOGRAPHY Right 12/01/2019   Procedure: Lower Extremity Angiography;  Surgeon: Annice Needy, MD;  Location: [facility] INVASIVE CV LAB;  Service: Cardiovascular;  Laterality: Right;   LOWER EXTREMITY ANGIOGRAPHY Right 12/02/2019   Procedure: LOWER EXTREMITY ANGIOGRAPHY;  Surgeon: Renford Dills, MD;  Location: [facility] INVASIVE CV LAB;  Service: Cardiovascular;  Laterality: Right;   LOWER EXTREMITY ANGIOGRAPHY Right 03/23/2020   Procedure: Lower Extremity Angiography;  Surgeon: Renford Dills, MD;  Location: [facility] INVASIVE CV LAB;  Service: Cardiovascular;  Laterality: Right;   LOWER EXTREMITY ANGIOGRAPHY Right 09/25/2021   Procedure: Lower Extremity Angiography;  Surgeon: Renford Dills, MD;  Location:  [facility] INVASIVE CV LAB;  Service: Cardiovascular;  Laterality: Right;   LOWER EXTREMITY ANGIOGRAPHY Right 09/26/2021   Procedure: Lower Extremity Angiography;  Surgeon: Annice Needy, MD;  Location: [facility] INVASIVE CV LAB;  Service: Cardiovascular;  Laterality: Right;   LOWER EXTREMITY ANGIOGRAPHY Right 04/08/2022   Procedure: Lower Extremity Angiography;  Surgeon: Renford Dills, MD;  Location: [facility] INVASIVE CV LAB;  Service: Cardiovascular;  Laterality: Right;   LOWER EXTREMITY INTERVENTION  11/18/2016   Procedure: Lower Extremity Intervention;  Surgeon: Renford Dills, MD;  Location: [facility] INVASIVE CV LAB;  Service: Cardiovascular;;   PERIPHERAL VASCULAR CATHETERIZATION Right 01/02/2015   Procedure: Lower Extremity Angiography;  Surgeon: Renford Dills, MD;  Location: [facility] INVASIVE CV LAB;  Service: Cardiovascular;  Laterality: Right;   PERIPHERAL VASCULAR CATHETERIZATION Right 06/18/2015   Procedure: Lower Extremity Angiography;  Surgeon: Annice Needy, MD;  Location: [facility] INVASIVE CV LAB;  Service: Cardiovascular;  Laterality: Right;   PERIPHERAL VASCULAR CATHETERIZATION  06/18/2015   Procedure: Lower Extremity Intervention;  Surgeon: Annice Needy, MD;  Location: [facility] INVASIVE CV LAB;  Service: Cardiovascular;;   PERIPHERAL VASCULAR CATHETERIZATION N/A 10/09/2015   Procedure: Abdominal Aortogram w/Lower Extremity;  Surgeon: Renford Dills, MD;  Location: [facility] INVASIVE CV LAB;  Service: Cardiovascular;  Laterality: N/A;   PERIPHERAL VASCULAR CATHETERIZATION  10/09/2015   Procedure: Lower Extremity Intervention;  Surgeon: Renford Dills, MD;  Location: [facility] INVAS[facility] CV LAB;  Service: Cardiovascular;;   PERIPHERAL VASCULAR CATHETERIZATION Right 10/10/2015   Procedure: Lower Extremity Angiography;  Surgeon: Annice Needy, MD;  Location: [facility] INV[facility] LAB;  Service: Cardiovascular;  Laterality: Right;   PERIPHERAL VASCULAR CATHETERIZATION Right 10/10/2015   Procedure: Lower Extremity Intervention;   Surgeon: Annice Needy, MD;  Location: [facility] IN[facility] LAB;  Service: Cardiovascular;  Laterality: Right;   PERIPHERAL VASCULAR CATHETERIZATION Right 12/03/2015   Procedure: Lower Extremity Angiography;  Surgeon: Annice Needy, MD;  Location: [facility];  Service: Cardiovascular;  Laterality: Right;   PERIPHERAL VASCULAR CATHETERIZATION Right 12/04/2015   Procedure: Lower Extremity Angiography;  Surgeon: Annice Needy, MD;  Location: [facility] INV[facility] LAB;  Service: Cardiovascular;  Laterality: Right;   PERIPHERAL VASCULAR CATHETERIZATION  12/04/2015   Procedure: Lower Extremity Intervention;  Surgeon: Annice Needy, MD;  Location: [facility] INV[facility] LAB;  Service: Cardiovascular;;   PERIPHERAL VASCULAR CATHETERIZATION Right 06/30/2016   Procedure: Lower Extremity Angiography;  Surgeon: Annice Needy, MD;  Location: [facility] INVAS[facility] CV LAB;  Service: Cardiovascular;  Laterality: Right;   PERIPHERAL VASCULAR CATHETERIZATION Right 06/25/2016   Procedure: Lower Extremity Angiography;  Surgeon: Renford Dills, MD;  Location: [facility] INV[facility] LAB;  Service: Cardiovascular;  Laterality: Right;   PERIPHERAL VASCULAR CATHETERIZATION Right 07/01/2016   Procedure: Lower Extremity Angiography;  Surgeon: Renford Dills, MD;  Location: [facility] IN[facility] LAB;  Service: Cardiovascular;  Laterality: Right;   PERIPHERAL VASCULAR CATHETERIZATION  07/01/2016   Procedure: Lower Extremity Intervention;  Surgeon: Renford Dills, MD;  Location: [facility] INVAS[facility] CV LAB;  Service: Cardiovascular;;   PERIPHERAL VASCULAR CATHETERIZATION Right 08/01/2016   Procedure: Lower Extremity Angiography;  Surgeon: Renford Dills, MD;  Location: [facility] INVAS[facility] CV LAB;  Service: Cardiovascular;  Laterality: Right;   PORTA CATH INSERTION N/A 10/05/2020   Procedure: PORTA CATH INSERTION;  Surgeon: Renford Dills, MD;  Location: [facility] INVASIVE CV LAB;  Service: Cardiovascular;  Laterality: N/A;   stent placement in right leg Right     VIDEO BRONCHOSCOPY N/A 09/14/2020   Procedure: VIDEO BRONCHOSCOPY WITH FLUORO;  Surgeon: Salena Saner, MD;  Location: [facility] ORS;  Service: Cardiopulmonary;  Laterality: N/A;   VIDEO BRONCHOSCOPY WITH ENDOBRONCHIAL ULTRASOUND N/A 09/14/2020   Procedure: VIDEO BRONCHOSCOPY WITH ENDOBRONCHIAL ULTRASOUND;  Surgeon: Salena Saner, MD;  Location: [facility] ORS;  Service: Cardiopulmonary;  Laterality: N/A;    SOCIAL HISTORY: Social History   Socioeconomic History   Marital status: Single    Spouse name: Not on file   Number of children: Not on file   Years of education: Not on file   Highest education level: Not on file  Occupational History   Occupation: unemployed  Tobacco Use   Smoking status:  Former    Current packs/day: 0.00    Average packs/day: 2.0 packs/day for 38.0 years (76.0 ttl pk-yrs)    Types: Cigarettes    Start date: 12/19/1976    Quit date: 12/20/2014    Years since quitting: 8.2   Smokeless tobacco: Never   Tobacco comments:    quit smoking in 2017 most smoked 2   Vaping Use   Vaping status: Former  Substance and Sexual Activity   Alcohol use: Not Currently   Drug use: No   Sexual activity: Yes  Other Topics Concern   Not on file  Social History Narrative   Not on file   Social Determinants of Health   Financial Resource Strain: Low Risk  (12/29/2022)   Received from [facility], [facility]   Overall Financial Resource Strain (CARDIA)    Difficulty of Paying Living Expenses: Not hard at all  Food Insecurity: No Food Insecurity (12/29/2022)   Received from [facility], [facility]   Hunger Vital Sign    Worried About Running Out of Food in the Last Year: Never true    Ran Out of Food in the Last Year: Never true  Transportation Needs: No Transportation Needs (12/29/2022)   Received from [facility], [facility]   [facility] - Transportation    In the  past 12 months, has lack of transportation kept you from medical appointments or from getting medications?: No    Lack of Transportation (Non-Medical): No  Physical Activity: Not on file  Stress: Not on file  Social Connections: Not on file  Intimate Partner Violence: Not on file    FAMILY HISTORY: Family History  Problem Relation Age of Onset   Diabetes Mother    Hypertension Mother    Heart murmur Mother    Leukemia Mother    Throat cancer Maternal Grandmother    Colon cancer Neg Hx    Breast cancer Neg Hx     ALLERGIES:  has No Known Allergies.  MEDICATIONS:  Current Outpatient Medications  Medication Sig Dispense Refill   aspirin EC 81 MG tablet Take 81 mg by mouth daily. Swallow whole.     clopidogrel (PLAVIX) 75 MG tablet TAKE 1 TABLET BY MOUTH DAILY 100 tablet 2   levothyroxine (SYNTHROID) 100 MCG tablet Take 1 tablet (100 mcg total) by mouth daily before breakfast. 90 tablet 0   levothyroxine (SYNTHROID) 50 MCG tablet Take 0.5 tablets (25 mcg total) by mouth daily before breakfast. Take with his levothyroxine. 30 tablet 0   simvastatin (ZOCOR) 40 MG tablet Take 40 mg by mouth at bedtime.     No current facility-administered medications for this visit.   Facility-Administered Medications Ordered in Other Visits  Medication Dose Route Frequency Provider Last Rate Last Admin   albuterol (PROVENTIL) (2.5 MG/3ML) 0.083% nebulizer solution 2.5 mg  2.5 mg Nebulization Once Salena Saner, MD         PHYSICAL EXAMINATION: ECOG PERFORMANCE STATUS: 1 - Symptomatic but completely ambulatory Vitals:   04/02/23 1427  BP: 118/78  Pulse: 100  Resp: 18  Temp: 99.4 F (37.4 C)  SpO2: 98%   Filed Weights   04/02/23 1427  Weight: 217 lb 8 oz (98.7 kg)    Physical Exam Constitutional:      General: He is not in acute distress.    Appearance: He is obese. He is not diaphoretic.     Comments: Patient walks  with a cane  HENT:     Head: Normocephalic and  atraumatic.  Eyes:     General: No scleral icterus. Cardiovascular:     Rate and Rhythm: Normal rate and regular rhythm.     Heart sounds: No murmur heard. Pulmonary:     Effort: Pulmonary effort is normal. No respiratory distress.  Abdominal:     General: There is no distension.     Palpations: Abdomen is soft.     Tenderness: There is no abdominal tenderness.  Musculoskeletal:     Cervical back: Normal range of motion and neck supple.  Skin:    General: Skin is warm and dry.     Findings: No erythema.  Neurological:     Mental Status: He is alert and oriented to person, place, and time. Mental status is at baseline.     Cranial Nerves: No cranial nerve deficit.     Motor: No abnormal muscle tone.  Psychiatric:        Mood and Affect: Mood and affect normal.      LABORATORY DATA:  I have reviewed the data as listed    Latest Ref Rng & Units 04/02/2023    2:01 PM 03/19/2023    8:35 AM 03/05/2023    8:44 AM  CBC  WBC 4.0 - 10.5 K/uL 4.8  3.5  5.3   Hemoglobin 13.0 - 17.0 g/dL 62.1  30.8  65.7   Hematocrit 39.0 - 52.0 % 42.0  38.7  41.4   Platelets 150 - 400 K/uL 260  222  233       Latest Ref Rng & Units 04/02/2023    2:01 PM 03/19/2023    8:35 AM 03/05/2023    8:44 AM  CMP  Glucose 70 - 99 mg/dL 846  962  952   BUN 8 - 23 mg/dL 9  19  15    Creatinine 0.61 - 1.24 mg/dL 8.41  3.24  4.01   Sodium 135 - 145 mmol/L 138  138  139   Potassium 3.5 - 5.1 mmol/L 3.7  3.5  3.9   Chloride 98 - 111 mmol/L 104  108  107   CO2 22 - 32 mmol/L 25  24  24    Calcium 8.9 - 10.3 mg/dL 9.1  9.0  8.9   Total Protein 6.5 - 8.1 g/dL 7.0  6.7  6.7   Total Bilirubin 0.3 - 1.2 mg/dL 0.4  0.5  0.5   Alkaline Phos 38 - 126 U/L 80  65  71   AST 15 - 41 U/L 24  22  24    ALT 0 - 44 U/L 17  21  20       RADIOGRAPHIC STUDIES: I have personally reviewed the radiological images as listed and agreed with the findings in the report. 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 * Tracking Code: BO * EXAM: CT CHEST, ABDOMEN, AND PELVIS WITH CONTRAST TECHNIQUE: Multidetector CT imaging of the chest, abdomen and pelvis was performed following the standard protocol during bolus administration of intravenous contrast. RADIATION DOSE REDUCTION: This exam was performed according to the departmental dose-optimization program which includes automated exposure control, adjustment of the mA and/or kV according to patient size and/or use of iterative reconstruction technique. CONTRAST:  OMNIPAQUE IOHEXOL 350 MG/ML SOLN COMPARISON:  11/25/2022 FINDINGS: CT CHEST FINDINGS Cardiovascular: Aortic atherosclerosis. Right Port-A-Cath tip high right atrium.  Normal heart size, without pericardial effusion. Lad and right coronary artery calcification. No central pulmonary embolism, on this non-dedicated study. Mediastinum/Nodes: No supraclavicular adenopathy. No mediastinal or hilar adenopathy. Lungs/Pleura: trace right pleural fluid or thickening is new or increased. Patent airways. Mild centrilobular emphysema. Similar appearance of central right upper lobe and perihilar consolidation with volume loss and traction bronchiectasis, consistent with radiation fibrosis. No local recurrence. The interstitial opacities including within the left lower lobe have resolved. Musculoskeletal: No acute osseous abnormality. CT ABDOMEN PELVIS FINDINGS Hepatobiliary: Mild hepatic steatosis without focal liver lesion or biliary abnormality. Pancreas: Normal, without mass or ductal dilatation. Spleen: Normal in size, without focal abnormality. Adrenals/Urinary Tract: Normal adrenal glands. Normal kidneys, without hydronephrosis. Normal urinary bladder. Stomach/Bowel: Normal stomach, without wall thickening. Extensive colonic diverticulosis. Normal terminal ileum and appendix. Normal small bowel. Vascular/Lymphatic:  Aortic atherosclerosis. No abdominopelvic adenopathy. Right superficial femoral artery grafts are incompletely imaged but again appear occluded. Reproductive: Normal prostate.  Small bilateral hydroceles. Other: No significant free fluid. No evidence of omental or peritoneal disease. Musculoskeletal: Partial degenerative fusion of the bilateral sacroiliac joints. Lumbar spine fixation at L5-S1. IMPRESSION: 1. 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 2. Trace right pleural fluid or thickening, new or minimally increased. 3. Incidental findings, including: Aortic atherosclerosis (ICD10-I70.0), coronary artery atherosclerosis and emphysema (ICD10-J43.9). Hepatic steatosis. Electronically Signed   By: Jeronimo Greaves M.D.   On: 03/16/2023 14:43   MR BRAIN W WO CONTRAST  Result Date: 02/14/2023 CLINICAL DATA:  Brain/CNS neoplasm, assess treatment response. EXAM: MRI HEAD WITHOUT AND WITH CONTRAST TECHNIQUE: Multiplanar, multiecho pulse sequences of the brain and surrounding structures were obtained without and with intravenous contrast. CONTRAST:  10mL GADAVIST GADOBUTROL 1 MMOL/ML IV SOLN COMPARISON:  MRI head November 05, 2022. FINDINGS: Brain: Interval decrease in multiple enhancing lesions. Posterior left temporal lobe lesion now measures 18 mm (previously 21 mm). High left parietal lobe lesion now measures 11 mm (previously 14 mm). Right occipital lobe lesion measures 11 mm (previously 17 mm). Left cerebellar lesion is now punctate (previously 7 mm). No new enhancing lesions identified. Edema surrounding lesions is also decreased. No progressive mass effect. No midline shift. No evidence of acute infarct, mass occupying acute hemorrhage, or hydrocephalus. Vascular: Major arterial flow voids are maintained at the skull base. Skull and upper cervical spine: Normal marrow signal. Sinuses/Orbits: Clear sinuses.  No acute orbital findings. Other: No mastoid  effusions. IMPRESSION: Interval decrease in all of the metastatic lesions, detailed above. No new lesions identified. Electronically Signed   By: Feliberto Harts M.D.   On: 02/14/2023 14:52   VAS [place] ABI WITH/WO TBI  Result Date: 01/08/2023  LOWER EXTREMITY DOPPLER STUDY Patient Name:  Joshua [name]  Date of Exam:   01/05/2023 Medical Rec #: [id number]            Accession #:    [id number] Date of Birth: 02/20/1956           Patient Gender: M Patient Age:   66 years Exam Location:  [facility] Procedure:      VAS [place] ABI WITH/WO TBI Referring Phys: Sheppard Plumber --------------------------------------------------------------------------------  Indications: Claudication, peripheral artery disease, and Routine f/u. High Risk Factors: Hyperlipidemia. Other Factors: ASO s/p multiple interventions and bypass.                11/15/2019 Right thrombectomy of Fempop insitu graft with added  stent throughout.                 12/01/2019: Aortogram and Selctive Right Lower Extremity                Angiogram. 6 mg of TPA delivered in a lysis catheterfrom the                Origin of the Right Femoral -Popliteal bypass down the proximal                ATA with placement of the lysis.                 12/02/2019: PTA of the Right Posterior Tibial Artery. PTA of the                Right Peroneal Artery. Mechanical Thrombectomy of the Right                Femoral -Popliteal Bypass graft. Mechanical Thrombectomy of the                Right Tibioperoneal Trunk and Posterior Tibial Artery.                 09/25/2021: Right Lower Extremity Angiography third order                catheter placement. PTA of the proximal peroneal and                tibioperoneal trunk to 2.5 mm with an ultra score balloon. PTA of                the Femoral Artery and the proximal anstomosis of the bypass                graft to 4 mm with an ultra score balloon. Administration of 5 mg                of tpa throughout the  entire bypass and in the peroneal Artery.                Initiation of continuous tpa infusion Right lower Extremity for                limb salvage. Insertion of a triple lumen catheter Left CFV with                [place] and fluoroscopic guidance.                 09/26/2021: Right LE Angiogram. PTA of the Right Posterior Tibial                Artery with 2.5 mm diameter by 30 cm length angioplasty balloon.                PTA of the distal bypass anastomosis and distal bypass graft                analogous to the Popliteal artery with 4 mm diameter by 8 cm                length Lutonix drug coated angioplasty balloon.                04/08/2022 right thrombectomy of the Deep profunda.  Vascular Interventions: Hx of multiple right lower extremity PTA's/stents.                         09/10/2016 Right femoral to below  the knee popliteal                         artery bypass graft placement. 5/01 Right bypass graft &                         mid popliteal artery PTA & coil embolectomy of large                         branch of bypass graft. 12/09/16 Right distal bypass                         graft PTA/stent and right posterior tibial artery PTA.                         03/23/2020 Rt PTA thrombectomy. Rt Profunda fem artery to                         PTA bypass. Comparison Study: 09/05/2022 Performing Technologist: Debbe Bales RVS  Examination Guidelines: A complete evaluation includes at minimum, Doppler waveform signals and systolic blood pressure reading at the level of bilateral brachial, anterior tibial, and posterior tibial arteries, when vessel segments are accessible. Bilateral testing is considered an integral part of a complete examination. Photoelectric Plethysmograph (PPG) waveforms and toe systolic pressure readings are included as required and additional duplex testing as needed. Limited examinations for reoccurring indications may be performed as noted.  ABI Findings:  +---------+------------------+-----+------------------+--------+ Right    Rt Pressure (mmHg)IndexWaveform          Comment  +---------+------------------+-----+------------------+--------+ Brachial 155                                               +---------+------------------+-----+------------------+--------+ ATA                             Non pulsatile flow         +---------+------------------+-----+------------------+--------+ PTA      69                0.45 monophasic                 +---------+------------------+-----+------------------+--------+ PERO     56                0.36 monophasic                 +---------+------------------+-----+------------------+--------+ Great Toe66                0.43 Abnormal                   +---------+------------------+-----+------------------+--------+ +---------+------------------+-----+---------+-------+ Left     Lt Pressure (mmHg)IndexWaveform Comment +---------+------------------+-----+---------+-------+ Brachial 155                                     +---------+------------------+-----+---------+-------+ ATA      158               1.02 triphasic        +---------+------------------+-----+---------+-------+ PTA      159  1.03 triphasic        +---------+------------------+-----+---------+-------+ Great Toe158               1.02 Normal           +---------+------------------+-----+---------+-------+ +-------+-----------+-----------+------------+------------+ ABI/TBIToday's ABIToday's TBIPrevious ABIPrevious TBI +-------+-----------+-----------+------------+------------+ Right  .45        .43        .63         .31          +-------+-----------+-----------+------------+------------+ Left   1.03       1.02       1.11        .91          +-------+-----------+-----------+------------+------------+ Bilateral TBIs appear increased compared to prior study on 09/05/2022. Left  ABIs appear essentially unchanged compared to prior study on 09/05/2022. The Right ABIs appear to be decreased compared to prior study on 09/05/2022.  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 The right toe-brachial index is abnormal. Imaged the Right ATA and Peroneal Artery at the level of the Ankle; Non pulsatile flow seen in the Right ATA and Monophasic flow seen in the Peroneal Artery. Left: Resting left ankle-brachial index is within normal range. The left toe-brachial index is normal. *See table(s) above for measurements and observations.   Electronically signed by Levora Dredge MD on 01/08/2023 at 3:23:11 PM.    Final

## 2023-04-03 ENCOUNTER — Encounter: Payer: Self-pay | Admitting: Oncology

## 2023-04-03 LAB — T4: T4, Total: 10.3 ug/dL (ref 4.5–12.0)

## 2023-04-06 ENCOUNTER — Other Ambulatory Visit: Payer: Self-pay | Admitting: *Deleted

## 2023-04-06 NOTE — Telephone Encounter (Signed)
  Component Ref Range & Units 4 d ago (04/02/23) 2 wk ago (03/19/23) 1 mo ago (03/05/23) 1 mo ago (02/19/23) 2 mo ago (01/23/23) 2 mo ago (01/08/23) 3 mo ago (12/25/22)  T4, Total 4.5 - 12.0 ug/dL 32.9 51.8 CM 9.7 CM 84.1 CM 8.4 CM 9.6 CM 11.1 CM   Component Ref Range & Units 4 d ago (04/02/23) 2 wk ago (03/19/23) 1 mo ago (03/05/23) 1 mo ago (02/19/23) 2 mo ago (02/05/23) 2 mo ago (01/23/23) 2 mo ago (01/08/23)  TSH 0.350 - 4.500 uIU/mL 17.134 High  11.143 High  CM 8.695 High  CM 11.563 High  CM 20.232 High  CM 22.216 High  CM 16.154 High  CM  Comment: Performed by a 3rd Generation assay with a functional sensitivity of <=0.01 uIU/mL. Performed at [facility], [address] [place], [place] [address]  Resulting Agency [facility] CLIN LAB [facility] CLIN LAB [facility] CL[facility] LAB [facility] CL[facility] LAB [facility] CLIN LAB         Specimen Collected: 04/02/23 14:01 Last Resulted: 04/02/23 16:48

## 2023-04-07 ENCOUNTER — Encounter: Payer: Self-pay | Admitting: Oncology

## 2023-04-07 MED ORDER — LEVOTHYROXINE SODIUM 100 MCG PO TABS: 100.0000 ug | ORAL_TABLET | Freq: Every day | ORAL | 0 refills | Status: DC

## 2023-04-07 MED ORDER — LEVOTHYROXINE SODIUM 50 MCG PO TABS: 25.0000 ug | ORAL_TABLET | Freq: Every day | ORAL | 0 refills | Status: DC

## 2023-04-15 ENCOUNTER — Telehealth: Payer: Self-pay | Admitting: Radiation Therapy

## 2023-04-15 NOTE — Telephone Encounter (Signed)
Mr. Soelberg called to report an incident he experienced last night but he is feeling much better, without issue now.   He reported word finding and trouble talking for a brief period last night. He felt like his tongue was thick and his face numb. He decided to see if it would pass on its own and it did, he is feeling much better today.  Dr. Barbaraann Cao would like the October brain MRI moved up to next available and to see the patient soon afterwards. The scan will be completed Thursday 9/26 and he will see Dr. Barbaraann Cao in [facility] Friday, 9/27.    [name] R.T.(R)(T)

## 2023-04-16 ENCOUNTER — Inpatient Hospital Stay: Payer: Medicare Other

## 2023-04-16 ENCOUNTER — Inpatient Hospital Stay (HOSPITAL_BASED_OUTPATIENT_CLINIC_OR_DEPARTMENT_OTHER): Payer: Medicare Other | Admitting: Oncology

## 2023-04-16 ENCOUNTER — Encounter: Payer: Self-pay | Admitting: Oncology

## 2023-04-16 ENCOUNTER — Ambulatory Visit
Admission: RE | Admit: 2023-04-16 | Discharge: 2023-04-16 | Disposition: A | Discharge status: Home / Self Care | Payer: Medicare Other | Source: Ambulatory Visit | Attending: Internal Medicine | Admitting: Internal Medicine

## 2023-04-16 VITALS — BP 126/83 | HR 88 | Temp 98.5°F | Resp 18 | Wt 216.7 lb

## 2023-04-16 VITALS — BP 143/74 | HR 80 | Temp 96.9°F | Resp 18

## 2023-04-16 DIAGNOSIS — C3491 Malignant neoplasm of unspecified part of right bronchus or lung: Secondary | ICD-10-CM

## 2023-04-16 DIAGNOSIS — C7931 Secondary malignant neoplasm of brain: Secondary | ICD-10-CM | POA: Insufficient documentation

## 2023-04-16 DIAGNOSIS — Z5112 Encounter for antineoplastic immunotherapy: Secondary | ICD-10-CM | POA: Diagnosis not present

## 2023-04-16 DIAGNOSIS — E032 Hypothyroidism due to medicaments and other exogenous substances: Secondary | ICD-10-CM

## 2023-04-16 LAB — CBC WITH DIFFERENTIAL/PLATELET
Abs Immature Granulocytes: 0.01 10*3/uL (ref 0.00–0.07)
Basophils Absolute: 0.1 10*3/uL (ref 0.0–0.1)
Basophils Relative: 1 %
Eosinophils Absolute: 0.3 10*3/uL (ref 0.0–0.5)
Eosinophils Relative: 5 %
HCT: 39.6 % (ref 39.0–52.0)
Hemoglobin: 12.6 g/dL — ABNORMAL LOW (ref 13.0–17.0)
Immature Granulocytes: 0 %
Lymphocytes Relative: 19 %
Lymphs Abs: 0.9 10*3/uL (ref 0.7–4.0)
MCH: 27 pg (ref 26.0–34.0)
MCHC: 31.8 g/dL (ref 30.0–36.0)
MCV: 84.8 fL (ref 80.0–100.0)
Monocytes Absolute: 0.5 10*3/uL (ref 0.1–1.0)
Monocytes Relative: 11 %
Neutro Abs: 3.1 10*3/uL (ref 1.7–7.7)
Neutrophils Relative %: 64 %
Platelets: 213 10*3/uL (ref 150–400)
RBC: 4.67 MIL/uL (ref 4.22–5.81)
RDW: 13.2 % (ref 11.5–15.5)
WBC: 4.9 10*3/uL (ref 4.0–10.5)
nRBC: 0 % (ref 0.0–0.2)

## 2023-04-16 LAB — COMPREHENSIVE METABOLIC PANEL
ALT: 15 U/L (ref 0–44)
AST: 21 U/L (ref 15–41)
Albumin: 3.8 g/dL (ref 3.5–5.0)
Alkaline Phosphatase: 82 U/L (ref 38–126)
Anion gap: 7 (ref 5–15)
BUN: 14 mg/dL (ref 8–23)
CO2: 24 mmol/L (ref 22–32)
Calcium: 8.6 mg/dL — ABNORMAL LOW (ref 8.9–10.3)
Chloride: 107 mmol/L (ref 98–111)
Creatinine, Ser: 1.11 mg/dL (ref 0.61–1.24)
GFR, Estimated: >60 mL/min (ref >60)
Glucose, Bld: 122 mg/dL — ABNORMAL HIGH (ref 70–99)
Potassium: 3.4 mmol/L — ABNORMAL LOW (ref 3.5–5.1)
Sodium: 138 mmol/L (ref 135–145)
Total Bilirubin: 0.6 mg/dL (ref 0.3–1.2)
Total Protein: 6.8 g/dL (ref 6.5–8.1)

## 2023-04-16 LAB — TSH: TSH: 3.378 u[IU]/mL (ref 0.350–4.500)

## 2023-04-16 MED ORDER — SODIUM CHLORIDE 0.9 % IV SOLN
Freq: Once | INTRAVENOUS | Status: AC
  Filled 2023-04-16: qty 250

## 2023-04-16 MED ORDER — SODIUM CHLORIDE 0.9 % IV SOLN
10.0000 mg/kg | Freq: Once | INTRAVENOUS | Status: AC
  Administered 2023-04-16: 1000 mg via INTRAVENOUS
  Filled 2023-04-16: qty 20

## 2023-04-16 MED ORDER — HEPARIN SOD (PORK) LOCK FLUSH 100 UNIT/ML IV SOLN
500.0000 [IU] | Freq: Once | INTRAVENOUS | Status: AC | PRN
  Administered 2023-04-16: 500 [IU]
  Filled 2023-04-16: qty 5

## 2023-04-16 MED ORDER — POTASSIUM CHLORIDE CRYS ER 20 MEQ PO TBCR: 20.0000 meq | EXTENDED_RELEASE_TABLET | Freq: Every day | ORAL | 0 refills | Status: DC

## 2023-04-16 MED ORDER — GADOBUTROL 1 MMOL/ML IV SOLN
10.0000 mL | Freq: Once | INTRAVENOUS | Status: AC | PRN
  Administered 2023-04-16: 10 mL via INTRAVENOUS

## 2023-04-16 NOTE — Patient Instructions (Signed)
[place] [facility]  Discharge Instructions: Thank you for choosing [facility] to provide your oncology and hematology care.  If you have a lab appointment with the Cancer Center, please go directly to the Cancer Center and check in at the registration area.  Wear comfortable clothing and clothing appropriate for easy access to any Portacath or PICC line.   We strive to give you quality time with your provider. You may need to reschedule your appointment if you arrive late (15 or more minutes).  Arriving late affects you and other patients whose appointments are after yours.  Also, if you miss three or more appointments without notifying the office, you may be dismissed from the clinic at the provider's discretion.      For prescription refill requests, have your pharmacy contact our office and allow 72 hours for refills to be completed.    Today you received the following chemotherapy and/or immunotherapy agents imfinzi      To help prevent nausea and vomiting after your treatment, we encourage you to take your nausea medication as directed.  BELOW ARE SYMPTOMS THAT SHOULD BE REPORTED IMMEDIATELY: *FEVER GREATER THAN 100.4 F (38 C) OR HIGHER *CHILLS OR SWEATING *NAUSEA AND VOMITING THAT IS NOT CONTROLLED WITH YOUR NAUSEA MEDICATION *UNUSUAL SHORTNESS OF BREATH *UNUSUAL BRUISING OR BLEEDING *URINARY PROBLEMS (pain or burning when urinating, or frequent urination) *BOWEL PROBLEMS (unusual diarrhea, constipation, pain near the anus) TENDERNESS IN MOUTH AND THROAT WITH OR WITHOUT PRESENCE OF ULCERS (sore throat, sores in mouth, or a toothache) UNUSUAL RASH, SWELLING OR PAIN  UNUSUAL VAGINAL DISCHARGE OR ITCHING   Items with * indicate a potential emergency and should be followed up as soon as possible or go to the Emergency Department if any problems should occur.  Please show the CHEMOTHERAPY ALERT CARD or IMMUNOTHERAPY ALERT CARD at check-in to  the Emergency Department and triage nurse.  Should you have questions after your visit or need to cancel or reschedule your appointment, please contact [facility]  [phone] and follow the prompts.  Office hours are 8:00 a.m. to 4:30 p.m. Monday - Friday. Please note that voicemails left after 4:00 p.m. may not be returned until the following business day.  We are closed weekends and major holidays. You have access to a nurse at all times for urgent questions. Please call the main number to the clinic [phone] and follow the prompts.  For any non-urgent questions, you may also contact your provider using MyChart. We now offer e-Visits for anyone 67 and older to request care online for non-urgent symptoms. For details visit [email or web address].   Also download the MyChart app! Go to the app store, search "MyChart", open the app, select [place], and log in with your MyChart username and password.

## 2023-04-16 NOTE — Assessment & Plan Note (Signed)
Durvalumab treatment as planned.

## 2023-04-16 NOTE — Progress Notes (Signed)
Pt here for follow up. No new concerns voiced.   

## 2023-04-16 NOTE — Progress Notes (Signed)
Hematology/Oncology Progress note [phone] [phone]    CHIEF COMPLAINTS/REASON FOR VISIT:  Follow up for lung cancer   ASSESSMENT & PLAN:   Cancer Staging  Squamous cell carcinoma of right lung (HCC) Staging form: Lung, AJCC 8th Edition - Clinical stage from 09/20/2020: Stage IV (cT1b, cN2, cM1) - Signed by [name], MD on 12/13/2021   Squamous cell carcinoma of right lung (HCC) Durvalumab was previously held due to pneumonitis.  May 2024 CT scan showed Improving bilateral scattered interstitial and ground-glass opacities. Discussed with pulmonology Dr. Jayme Cloud. 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  Labs are reviewed and discussed with patient. Proceed with Durvalumab.    Hypothyroidism due to medication Elevated TSH with normal T4.  continue levothyroxine to 125 mcg daily    Encounter for antineoplastic immunotherapy Durvalumab treatment as planned.   Malignant neoplasm metastatic to brain [facility]) Status post radiation,  02/19/22 MRI showed decreased brain lesion size and edema.  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 Mitzi Hansen.  MRI on 02/05/23 stable improved - MRI surveillance, scheduled today    No orders of the defined types were placed in this encounter.   Follow up 2 weeks lab MD Durvalumab All questions were answered. The patient knows to call the clinic with any problems, questions or concerns.  [name], MD, PhD [facility] Hematology Oncology 04/16/2023       HISTORY OF PRESENTING ILLNESS:  Joshua [name] is a 67 y.o. male who has above history  reviewed by me today presents for follow up visit for Stage IV lung squamous cell carcinoma. Oncology History  Squamous cell carcinoma of right lung (HCC)  09/18/2020 Imaging   PET scan showed right upper lobe nodule maximal SUV 11.5.  Hypermetabolic cluster right paratracheal adenopathy with maximum SUV up to 12.  No findings of metastatic disease to the neck/abdomen/pelvis or skeleton.   09/20/2020 Initial Diagnosis   Squamous cell carcinoma of right lung (HCC) NGS showed PD-L1 TPS 95%, LRP1B S934fs, PIK3CA E545K, RB1 c1422-2A>T, RIT1 T83_ A84del, STK11 P239fs, TPS53 R248W, TMB 19.39mut/mb, MS stable.    09/20/2020 Cancer Staging   Staging form: Lung, AJCC 8th Edition - Clinical stage from 09/20/2020: Stage IV (cT1b, cN2, cM1) - Signed by [name], MD on 12/13/2021   10/03/2020 Imaging   MRI brain with and without contrast showed no evidence of intracranial metastatic disease. Well-defined round lesion within the left parotid tail measuring approximately 2.3 x 1.8 x 1.8 cm   10/09/2020 - 11/27/2020 Radiation Therapy   concurrent chemoradiation   10/10/2020 - 10/31/2020 Chemotherapy   10/10/2020 concurrent chemoradiation.  carboplatin AUC of 2 and Taxol 45 mg/m2 x 4.  Additional chemotherapy was held due to thrombocytopenia, neutropenia       12/31/2020 - 03/21/2022 Chemotherapy   Maintenance  Durvalumab q14d      12/31/2020 -  Chemotherapy   Patient is on Treatment Plan : LUNG Durvalumab (10) q14d     03/28/2021 Imaging   CT without contrast showed new linear right perihilar consolidation with associated traction bronchiectasis.  Likely postradiation changes.  Additional new peribronchial vascular/nodular groundglass opacities with areas of consolidation or seen primarily in the  right upper and lower lobes, more distant from expected radiation fields. 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   Per my discussion with pulmonology Dr. 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     05/09/2021 Imaging   Repeat CT chest without contrast showed evolving radiation changes in the right perihilar region likely accounting for interval increase to thickening of the posterior wall of the right upper lobe bronchus.  The more peripheral right lung groundglass opacities seen on most recent study have generally improved with exception of one peripherally right upper lobe which appears new.  These are likely inflammatory.  No findings highly suspicious for local recurrence.   05/30/2021 - 01/10/2022 Chemotherapy   Durvalumab q14d     10/07/2021 Imaging   MRI brain with and without contrast showed 2 contrast-enhancing lesions, most consistent with metastatic disease.  Largest lesion is in the left frontal lobe and measures up to 1.3 cm with a large amount of surrounding edema and 9 mm rightward midline shift.  The other enhancing lesion was 4 mm in the right occipital lobe with a small amount of surrounding edema.  No acute or chronic hemorrhage.   10/07/2021 Progression   Headache after colonoscopy, progressive difficulty using her right arm.  Progressively worsening of speech. Stage IV lung cancer with brain metastasis.  started on Dexamethasone.  He was seen by neurosurgery and radiation oncology.  Recommendation is to proceed with SRS.  Patient was discharged with dexamethasone   10/16/2021 Imaging   PET scan showed postradiation changes in the right supra hilar lung without evidence of local recurrence.  No evidence of lung cancer recurrence or metastatic disease.  Single focus of intense metabolic activity associated with posterior left 11th rib likely secondary to recent fall/posttraumatic metabolic changes.  Frontal  uptake in the left frontal lobe corresponding to the known metastatic lesion in the brain   10/29/2021 -  Radiation Therapy   Brain Radiation [facility]   12/10/2021  Imaging   Brain MRI: Continued interval decrease in size of a left frontal lobe metastasis, now measuring 10 mm. Moderate surrounding edema has also decreased.  3-4 mm metastasis within the right occipital lobe, not significantly changed in size. There is now a small focus of central non enhancement within this lesion. Increased edema. 3 mm enhancing metastasis within the high left parietal lobe. In retrospect, this was likely present as a subtle punctate focus of enhancement on the prior brain MRI of 10/22/2021.   01/18/2022 Imaging   CT chest abdomen pelvis w contrast 1. Stable post treatment changes in the right upper lobe medially with dense radiation fibrosis but no findings to suggest local recurrent tumor. 2. Significant progression of ill-defined ground-glass opacity, interstitial thickening and airspace nodularity in the right upper lobe and upper aspect of the right lower lobe. Findings could reflect an inflammatory or atypical infectious process, drug induced pneumonitis or interstitial spread of tumor.3. No mediastinal or hilar mass or adenopathy. 4. No findings for abdominal/pelvic metastatic disease or osseous metastatic disease.   02/19/2022 Imaging   MRI brain w wo contrast showed Three metastatic deposits in the brain appear smaller. Decreased edema in the left parietal lobe. No new lesions    04/04/2022 Imaging   CT chest w contrast 1. Stable radiation changes in the right perihilar region without evidence of local recurrence. 2. Interval improvement in the patchy ground-glass opacities previously demonstrated  in both lungs, most consistent with a resolving infectious or inflammatory process (including immunotherapy-related pneumonitis). 3. No evidence of metastatic disease.   05/14/2022 Miscellaneous   He had a mechanical fall and fractured his left elbow. xrays showed a left elbow fracture  05/26/22 seen by orthopedic surgeon, cast applied.    05/27/2022 Imaging   MRI brain w  wo contrast 1. All three known brain metastases have enlarged since August, with increasing regional edema. And there is a small new left anterior cerebellar 4-5 mm metastasis (was questionably punctate on the May MRI). This constellation could reflect a combination of True Progression (Left Cerebellar lesion) and pseudoprogression/radionecrosis (3 existing mets).    2. No significant intracranial mass effect. 3. Underlying moderate cerebral white matter signal changes.   05/30/2022 Imaging   CT chest abdomen pelvis w contrast  1. 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 There is a stable small right pleural effusion, but no definitive findings to suggest residual or recurrent disease on today's examination. 2. No signs of metastatic disease in the abdomen or pelvis.  3. Aortic atherosclerosis, in addition to two-vessel coronary artery disease. Please note that although the presence of coronary artery calcium documents the presence of coronary artery disease, the severity of this disease and any potential stenosis cannot be assessed on this non-gated CT examination. Assessment for potential risk factor modification, dietary therapy or pharmacologic therapy may be warranted, if clinically indicated. 4. Hepatic steatosis. 5. Colonic diverticulosis without evidence of acute diverticulitis at this time. 6. Incompletely imaged but apparently occluded vascular graft in the superior aspect of the right anterior thigh. Referral to vascular specialist for further clinical evaluation should be considered if clinically appropriate. 7. Additional incidental findings, as above.   07/22/2022 Imaging   MRI brain  1. No new or enlarging lesions. No intracranial disease progression. 2. Decreased size of treated lesions of the left frontoparietal junction and left postcentral gyrus. 3.  Unchanged lesions in the left cerebellum and right occipital lobe.   10/15/2022 Imaging   MRI brain w wo contrast showed Interval increase in size of multiple intraparenchymal lesions with worsened surrounding vasogenic edema. 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 No new lesions   11/05/2022 Imaging   MRI brain w wo contrast  1. Likely slight increase and right occipital and left cerebellar lesions. Otherwise, unchanged metastases, as above. No new lesions identified. 2. Partially imaged large probable lymph node in the left upper neck, apparent on prior PET CT.   11/13/2022 -  Radiation Therapy   Each of the sites were treated with SRS/SBRT/SRT-IMRT technique to 20 Gy in 1 fraction to each of the following sites: PTV_2_LCerebel_74mm PTV_3_RCerebel_46mm PTV_4_ROccip_90mm PTV_5_LParietal_31mm   11/27/2022 Imaging   CT chest abdomen pelvis w contrast showed  Improving bilateral scattered interstitial and ground-glass opacities. Small amount of residual areas in the extreme left lower lobe inferiorly. This specific area is slightly increased from previous but overall the amount of opacities is significantly improved. Recommend continued follow-up. No developing new mass lesion, fluid collection or lymph node enlargement. Stable posttreatment changes right perihilar in the lung. Fatty liver infiltration. Colonic diverticulosis. Improved tiny right pleural effusion   02/05/2023 Imaging   MRI brain with and without contrast Interval decrease in all of the metastatic lesions, detailed above.No new lesions identified.    Imaging   CT chest abdomen pelvis w  contrast showed  1. 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 2. Trace right pleural fluid or thickening, new or minimally increased. 3. Incidental findings, including: Aortic  atherosclerosis (ICD10-I70.0), coronary artery atherosclerosis and emphysema (ICD10-J43.9). Hepatic steatosis.    Today he feels well.Intermittent headache, is better. denies any focal deficits. chronic SOB,not worse.  He reports a brief period of not able to talk well. + tongue was thick and right face numbness. Symptoms improved MRI brain scheduled in Oct 2024 was moved up to today.  occasional cough. No new complaints.    Review of Systems  Constitutional:  Positive for fatigue. Negative for appetite change, chills, fever and unexpected weight change.  HENT:   Negative for hearing loss and voice change.   Eyes:  Negative for eye problems and icterus.  Respiratory:  Negative for chest tightness, cough and shortness of breath.   Cardiovascular:  Negative for chest pain and leg swelling.  Gastrointestinal:  Negative for abdominal distention and abdominal pain.  Endocrine: Negative for hot flashes.  Genitourinary:  Negative for difficulty urinating, dysuria and frequency.   Musculoskeletal:  Negative for arthralgias.  Skin:  Negative for itching and rash.  Neurological:  Positive for extremity weakness. Negative for headaches, light-headedness, numbness and speech difficulty.       + tongue was thick and right face numbness.  Hematological:  Negative for adenopathy. Does not bruise/bleed easily.  Psychiatric/Behavioral:  Negative for confusion.       MEDICAL HISTORY:  Past Medical History:  Diagnosis Date   Allergy    Cancer (HCC)    lung   COPD (chronic obstructive pulmonary disease) (HCC)    Dyspnea    former smoker. only doe   History of kidney stones    Hypertension    Peripheral vascular disease (HCC)    Pre-diabetes    Sleep apnea     SURGICAL HISTORY: Past Surgical History:  Procedure Laterality Date   ANTERIOR CERVICAL DECOMP/DISCECTOMY FUSION N/A 03/14/2020   Procedure: ANTERIOR CERVICAL DECOMPRESSION/DISCECTOMY FUSION 3 LEVELS C4-7;  Surgeon: Venetia Night, MD;  Location: [facility] ORS;  Service: Neurosurgery;  Laterality: N/A;   BACK SURGERY  2011   CENTRAL LINE INSERTION Right 09/10/2016   Procedure: CENTRAL LINE INSERTION;  Surgeon: Renford Dills, MD;  Location: [facility] ORS;  Service: Vascular;  Laterality: Right;   CERVICAL FUSION     C4-c7 on Mar 14, 2020   COLONOSCOPY  2013 ?   COLONOSCOPY N/A 09/09/2021   Procedure: COLONOSCOPY;  Surgeon: Jaynie Collins, DO;  Location: [facility] ENDOSCOPY;  Service: Gastroenterology;  Laterality: N/A;   CORONARY ANGIOPLASTY     ESOPHAGOGASTRODUODENOSCOPY N/A 09/09/2021   Procedure: ESOPHAGOGASTRODUODENOSCOPY (EGD);  Surgeon: Jaynie Collins, DO;  Location: [facility] ENDOSCOPY;  Service: Gastroenterology;  Laterality: N/A;   FEMORAL-POPLITEAL BYPASS GRAFT Right 09/10/2016   Procedure: BYPASS GRAFT FEMORAL-POPLITEAL ARTERY ( BELOW KNEE );  Surgeon: Renford Dills, MD;  Location: [facility] ORS;  Service: Vascular;  Laterality: Right;   FEMORAL-TIBIAL BYPASS GRAFT Right 03/23/2020   Procedure: BYPASS GRAFT RIGHT FEMORAL- DISTAL TIBIAL ARTERY WITH DISTAL FLOW GRAFT;  Surgeon: Renford Dills, MD;  Location: [facility] ORS;  Service: Vascular;  Laterality: Right;   LEFT HEART CATH AND CORONARY ANGIOGRAPHY Left 05/16/2021   Procedure: LEFT HEART CATH AND CORONARY ANGIOGRAPHY;  Surgeon: Armando Reichert, MD;  Location: [facility] INVASIVE CV LAB;  Service: Cardiovascular;  Laterality: Left;   LOWER EXTREMITY ANGIOGRAPHY Right 11/18/2016  Procedure: Lower Extremity Angiography;  Surgeon: Renford Dills, MD;  Location: [facility] INVASIVE CV LAB;  Service: Cardiovascular;  Laterality: Right;   LOWER EXTREMITY ANGIOGRAPHY Right 12/09/2016   Procedure: Lower Extremity Angiography;  Surgeon: Renford Dills, MD;  Location: [facility] INVASIVE CV LAB;  Service: Cardiovascular;  Laterality: Right;   LOWER EXTREMITY ANGIOGRAPHY Right 11/15/2019   Procedure: LOWER EXTREMITY ANGIOGRAPHY;  Surgeon: Renford Dills, MD;  Location: [facility]  INVASIVE CV LAB;  Service: Cardiovascular;  Laterality: Right;   LOWER EXTREMITY ANGIOGRAPHY Right 12/01/2019   Procedure: Lower Extremity Angiography;  Surgeon: Annice Needy, MD;  Location: [facility] INVASIVE CV LAB;  Service: Cardiovascular;  Laterality: Right;   LOWER EXTREMITY ANGIOGRAPHY Right 12/02/2019   Procedure: LOWER EXTREMITY ANGIOGRAPHY;  Surgeon: Renford Dills, MD;  Location: [facility] INVASIVE CV LAB;  Service: Cardiovascular;  Laterality: Right;   LOWER EXTREMITY ANGIOGRAPHY Right 03/23/2020   Procedure: Lower Extremity Angiography;  Surgeon: Renford Dills, MD;  Location: [facility] INVASIVE CV LAB;  Service: Cardiovascular;  Laterality: Right;   LOWER EXTREMITY ANGIOGRAPHY Right 09/25/2021   Procedure: Lower Extremity Angiography;  Surgeon: Renford Dills, MD;  Location: [facility] INVASIVE CV LAB;  Service: Cardiovascular;  Laterality: Right;   LOWER EXTREMITY ANGIOGRAPHY Right 09/26/2021   Procedure: Lower Extremity Angiography;  Surgeon: Annice Needy, MD;  Location: [facility] INVASIVE CV LAB;  Service: Cardiovascular;  Laterality: Right;   LOWER EXTREMITY ANGIOGRAPHY Right 04/08/2022   Procedure: Lower Extremity Angiography;  Surgeon: Renford Dills, MD;  Location: [facility] INVASIVE CV LAB;  Service: Cardiovascular;  Laterality: Right;   LOWER EXTREMITY INTERVENTION  11/18/2016   Procedure: Lower Extremity Intervention;  Surgeon: Renford Dills, MD;  Location: [facility] INVASIVE CV LAB;  Service: Cardiovascular;;   PERIPHERAL VASCULAR CATHETERIZATION Right 01/02/2015   Procedure: Lower Extremity Angiography;  Surgeon: Renford Dills, MD;  Location: [facility] INVASIVE CV LAB;  Service: Cardiovascular;  Laterality: Right;   PERIPHERAL VASCULAR CATHETERIZATION Right 06/18/2015   Procedure: Lower Extremity Angiography;  Surgeon: Annice Needy, MD;  Location: [facility] INVASIVE CV LAB;  Service: Cardiovascular;  Laterality: Right;   PERIPHERAL VASCULAR CATHETERIZATION  06/18/2015   Procedure: Lower Extremity  Intervention;  Surgeon: Annice Needy, MD;  Location: [facility] INVASIVE CV LAB;  Service: Cardiovascular;;   PERIPHERAL VASCULAR CATHETERIZATION N/A 10/09/2015   Procedure: Abdominal Aortogram w/Lower Extremity;  Surgeon: Renford Dills, MD;  Location: [facility] INV[facility];  Service: Cardiovascular;  Laterality: N/A;   PERIPHERAL VASCULAR CATHETERIZATION  10/09/2015   Procedure: Lower Extremity Intervention;  Surgeon: Renford Dills, MD;  Location: [facility] IN[facility];  Service: Cardiovascular;;   PERIPHERAL VASCULAR CATHETERIZATION Right 10/10/2015   Procedure: Lower Extremity Angiography;  Surgeon: Annice Needy, MD;  Location: [facility] IN[facility];  Service: Cardiovascular;  Laterality: Right;   PERIPHERAL VASCULAR CATHETERIZATION Right 10/10/2015   Procedure: Lower Extremity Intervention;  Surgeon: Annice Needy, MD;  Location: [facility];  Service: Cardiovascular;  Laterality: Right;   PERIPHERAL VASCULAR CATHETERIZATION Right 12/03/2015   Procedure: Lower Extremity Angiography;  Surgeon: Annice Needy, MD;  Location: [facility];  Service: Cardiovascular;  Laterality: Right;   PERIPHERAL VASCULAR CATHETERIZATION Right 12/04/2015   Procedure: Lower Extremity Angiography;  Surgeon: Annice Needy, MD;  Location: [facility];  Service: Cardiovascular;  Laterality: Right;   PERIPHERAL VASCULAR CATHETERIZATION  12/04/2015   Procedure: Lower Extremity Intervention;  Surgeon: Annice Needy, MD;  Location: [facility];  Service: Cardiovascular;;   PERIPHERAL VASCULAR CATHETERIZATION Right 06/30/2016   Procedure: Lower Extremity Angiography;  Surgeon: Annice Needy, MD;  Location: [facility];  Service: Cardiovascular;  Laterality: Right;   PERIPHERAL VASCULAR CATHETERIZATION Right 06/25/2016   Procedure: Lower Extremity Angiography;  Surgeon: Renford Dills, MD;  Location: [facility];  Service: Cardiovascular;  Laterality: Right;   PERIPHERAL VASCULAR  CATHETERIZATION Right 07/01/2016   Procedure: Lower Extremity Angiography;  Surgeon: Renford Dills, MD;  Location: [facility];  Service: Cardiovascular;  Laterality: Right;   PERIPHERAL VASCULAR CATHETERIZATION  07/01/2016   Procedure: Lower Extremity Intervention;  Surgeon: Renford Dills, MD;  Location: [facility] IN[facility];  Service: Cardiovascular;;   PERIPHERAL VASCULAR CATHETERIZATION Right 08/01/2016   Procedure: Lower Extremity Angiography;  Surgeon: Renford Dills, MD;  Location: [facility];  Service: Cardiovascular;  Laterality: Right;   PORTA CATH INSERTION N/A 10/05/2020   Procedure: PORTA CATH INSERTION;  Surgeon: Renford Dills, MD;  Location: [facility] INV[facility] CV LAB;  Service: Cardiovascular;  Laterality: N/A;   stent placement in right leg Right    VIDEO BRONCHOSCOPY N/A 09/14/2020   Procedure: VIDEO BRONCHOSCOPY WITH FLUORO;  Surgeon: Salena Saner, MD;  Location: [facility] ORS;  Service: Cardiopulmonary;  Laterality: N/A;   VIDEO BRONCHOSCOPY WITH ENDOBRONCHIAL ULTRASOUND N/A 09/14/2020   Procedure: VIDEO BRONCHOSCOPY WITH ENDOBRONCHIAL ULTRASOUND;  Surgeon: Salena Saner, MD;  Location: [facility] ORS;  Service: Cardiopulmonary;  Laterality: N/A;    SOCIAL HISTORY: Social History   Socioeconomic History   Marital status: Single    Spouse name: Not on file   Number of children: Not on file   Years of education: Not on file   Highest education level: Not on file  Occupational History   Occupation: unemployed  Tobacco Use   Smoking status: Former    Current packs/day: 0.00    Average packs/day: 2.0 packs/day for 38.0 years (76.0 ttl pk-yrs)    Types: Cigarettes    Start date: 12/19/1976    Quit date: 12/20/2014    Years since quitting: 8.3   Smokeless tobacco: Never   Tobacco comments:    quit smoking in 2017 most smoked 2   Vaping Use   Vaping status: Former  Substance and Sexual Activity   Alcohol use: Not Currently   Drug use: No    Sexual activity: Yes  Other Topics Concern   Not on file  Social History Narrative   Not on file   Social Determinants of Health   Financial Resource Strain: Low Risk  (12/29/2022)   Received from [facility], [facility]   Overall Financial Resource Strain (CARDIA)    Difficulty of Paying Living Expenses: Not hard at all  Food Insecurity: No Food Insecurity (12/29/2022)   Received from [facility], [facility]   Hunger Vital Sign    Worried About Running Out of Food in the Last Year: Never true    Ran Out of Food in the Last Year: Never true  Transportation Needs: No Transportation Needs (12/29/2022)   Received from [facility], [facility]   [facility] - Transportation    In the past 12 months, has lack of transportation kept you from medical appointments or from getting medications?: No    Lack of Transportation (Non-Medical): No  Physical Activity: Not on file  Stress: Not on file  Social Connections: Not on file  Intimate Partner Violence: Not on file    FAMILY HISTORY: Family History  Problem Relation Age of Onset   Diabetes Mother    Hypertension Mother    Heart murmur Mother    Leukemia Mother    Throat cancer Maternal Grandmother    Colon cancer Neg Hx    Breast cancer Neg Hx     ALLERGIES:  has No Known Allergies.  MEDICATIONS:  Current Outpatient Medications  Medication Sig Dispense Refill   aspirin EC 81 MG tablet Take 81 mg by mouth daily. Swallow whole.     clopidogrel (PLAVIX) 75 MG tablet TAKE 1 TABLET BY MOUTH DAILY 100 tablet 2   levothyroxine (SYNTHROID) 100 MCG tablet Take 1 tablet (100 mcg total) by mouth daily before breakfast. 90 tablet 0   levothyroxine (SYNTHROID) 50 MCG tablet Take 0.5 tablets (25 mcg total) by mouth daily before breakfast. Take with his levothyroxine. 45 tablet 0   potassium chloride SA (KLOR-CON M) 20 MEQ tablet Take  1 tablet (20 mEq total) by mouth daily. 3 tablet 0   simvastatin (ZOCOR) 40 MG tablet Take 40 mg by mouth at bedtime.     No current facility-administered medications for this visit.   Facility-Administered Medications Ordered in Other Visits  Medication Dose Route Frequency Provider Last Rate Last Admin   albuterol (PROVENTIL) (2.5 MG/3ML) 0.083% nebulizer solution 2.5 mg  2.5 mg Nebulization Once Salena Saner, MD         PHYSICAL EXAMINATION: ECOG PERFORMANCE STATUS: 1 - Symptomatic but completely ambulatory Vitals:   04/16/23 1338  BP: 126/83  Pulse: 88  Resp: 18  Temp: 98.5 F (36.9 C)  SpO2: 100%   Filed Weights   04/16/23 1338  Weight: 216 lb 11.2 oz (98.3 kg)    Physical Exam Constitutional:      General: He is not in acute distress.    Appearance: He is obese. He is not diaphoretic.     Comments: Patient walks with a cane  HENT:     Head: Normocephalic and atraumatic.  Eyes:     General: No scleral icterus. Cardiovascular:     Rate and Rhythm: Normal rate and regular rhythm.     Heart sounds: No murmur heard. Pulmonary:     Effort: Pulmonary effort is normal. No respiratory distress.  Abdominal:     General: There is no distension.     Palpations: Abdomen is soft.     Tenderness: There is no abdominal tenderness.  Musculoskeletal:     Cervical back: Normal range of motion and neck supple.  Skin:    General: Skin is warm and dry.     Findings: No erythema.  Neurological:     Mental Status: He is alert and oriented to person, place, and time. Mental status is at baseline.     Cranial Nerves: No cranial nerve deficit.     Motor: No abnormal muscle tone.  Psychiatric:        Mood and Affect: Mood and affect normal.      LABORATORY DATA:  I have reviewed the data as listed    Latest Ref Rng & Units 04/16/2023    1:02 PM 04/02/2023    2:01 PM 03/19/2023    8:35 AM  CBC  WBC 4.0 - 10.5 K/uL 4.9  4.8  3.5   Hemoglobin 13.0 - 17.0 g/dL 40.3  47.4   25.9   Hematocrit 39.0 - 52.0 % 39.6  42.0  38.7  Platelets 150 - 400 K/uL 213  260  222       Latest Ref Rng & Units 04/16/2023    1:02 PM 04/02/2023    2:01 PM 03/19/2023    8:35 AM  CMP  Glucose 70 - 99 mg/dL 191  478  295   BUN 8 - 23 mg/dL 14  9  19    Creatinine 0.61 - 1.24 mg/dL 6.21  3.08  6.57   Sodium 135 - 145 mmol/L 138  138  138   Potassium 3.5 - 5.1 mmol/L 3.4  3.7  3.5   Chloride 98 - 111 mmol/L 107  104  108   CO2 22 - 32 mmol/L 24  25  24    Calcium 8.9 - 10.3 mg/dL 8.6  9.1  9.0   Total Protein 6.5 - 8.1 g/dL 6.8  7.0  6.7   Total Bilirubin 0.3 - 1.2 mg/dL 0.6  0.4  0.5   Alkaline Phos 38 - 126 U/L 82  80  65   AST 15 - 41 U/L 21  24  22    ALT 0 - 44 U/L 15  17  21       RADIOGRAPHIC STUDIES: I have personally reviewed the radiological images as listed and agreed with the findings in the report. 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 * Tracking Code: BO * EXAM: CT CHEST, ABDOMEN, AND PELVIS WITH CONTRAST TECHNIQUE: Multidetector CT imaging of the chest, abdomen and pelvis was performed following the standard protocol during bolus administration of intravenous contrast. RADIATION DOSE REDUCTION: This exam was performed according to the departmental dose-optimization program which includes automated exposure control, adjustment of the mA and/or kV according to patient size and/or use of iterative reconstruction technique. CONTRAST:  OMNIPAQUE IOHEXOL 350 MG/ML SOLN COMPARISON:  11/25/2022 FINDINGS: CT CHEST FINDINGS Cardiovascular: Aortic atherosclerosis. Right Port-A-Cath tip high right atrium. Normal heart size, without pericardial effusion. Lad and right coronary artery calcification. No central pulmonary embolism, on this non-dedicated study. Mediastinum/Nodes: No supraclavicular adenopathy. No mediastinal or hilar adenopathy. Lungs/Pleura: trace right  pleural fluid or thickening is new or increased. Patent airways. Mild centrilobular emphysema. Similar appearance of central right upper lobe and perihilar consolidation with volume loss and traction bronchiectasis, consistent with radiation fibrosis. No local recurrence. The interstitial opacities including within the left lower lobe have resolved. Musculoskeletal: No acute osseous abnormality. CT ABDOMEN PELVIS FINDINGS Hepatobiliary: Mild hepatic steatosis without focal liver lesion or biliary abnormality. Pancreas: Normal, without mass or ductal dilatation. Spleen: Normal in size, without focal abnormality. Adrenals/Urinary Tract: Normal adrenal glands. Normal kidneys, without hydronephrosis. Normal urinary bladder. Stomach/Bowel: Normal stomach, without wall thickening. Extensive colonic diverticulosis. Normal terminal ileum and appendix. Normal small bowel. Vascular/Lymphatic: Aortic atherosclerosis. No abdominopelvic adenopathy. Right superficial femoral artery grafts are incompletely imaged but again appear occluded. Reproductive: Normal prostate.  Small bilateral hydroceles. Other: No significant free fluid. No evidence of omental or peritoneal disease. Musculoskeletal: Partial degenerative fusion of the bilateral sacroiliac joints. Lumbar spine fixation at L5-S1. IMPRESSION: 1. 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 2. Trace right pleural fluid or thickening, new or minimally increased. 3. Incidental findings, including: Aortic atherosclerosis (ICD10-I70.0), coronary artery atherosclerosis and emphysema (ICD10-J43.9). Hepatic steatosis. Electronically Signed   By: Jeronimo Greaves M.D.   On: 03/16/2023 14:43   MR BRAIN W WO CONTRAST  Result Date:  02/14/2023 CLINICAL DATA:  Brain/CNS neoplasm, assess treatment response. EXAM: MRI HEAD WITHOUT AND WITH CONTRAST TECHNIQUE: Multiplanar, multiecho pulse sequences of the brain and surrounding  structures were obtained without and with intravenous contrast. CONTRAST:  10mL GADAVIST GADOBUTROL 1 MMOL/ML IV SOLN COMPARISON:  MRI head November 05, 2022. FINDINGS: Brain: Interval decrease in multiple enhancing lesions. Posterior left temporal lobe lesion now measures 18 mm (previously 21 mm). High left parietal lobe lesion now measures 11 mm (previously 14 mm). Right occipital lobe lesion measures 11 mm (previously 17 mm). Left cerebellar lesion is now punctate (previously 7 mm). No new enhancing lesions identified. Edema surrounding lesions is also decreased. No progressive mass effect. No midline shift. No evidence of acute infarct, mass occupying acute hemorrhage, or hydrocephalus. Vascular: Major arterial flow voids are maintained at the skull base. Skull and upper cervical spine: Normal marrow signal. Sinuses/Orbits: Clear sinuses.  No acute orbital findings. Other: No mastoid effusions. IMPRESSION: Interval decrease in all of the metastatic lesions, detailed above. No new lesions identified. Electronically Signed   By: Feliberto Harts M.D.   On: 02/14/2023 14:52

## 2023-04-16 NOTE — Assessment & Plan Note (Signed)
Status post radiation,  02/19/22 MRI showed decreased brain lesion size and edema.  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 Mitzi Hansen.  MRI on 02/05/23 stable improved - MRI surveillance, scheduled today

## 2023-04-16 NOTE — Assessment & Plan Note (Signed)
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  May 2024 CT scan showed Improving bilateral scattered interstitial and ground-glass opacities. Discussed with pulmonology Dr. Jayme Cloud. 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  Labs are reviewed and discussed with patient. Proceed with Durvalumab.

## 2023-04-16 NOTE — Assessment & Plan Note (Signed)
Elevated TSH with normal T4.  continue levothyroxine to 125 mcg daily

## 2023-04-17 ENCOUNTER — Inpatient Hospital Stay (HOSPITAL_BASED_OUTPATIENT_CLINIC_OR_DEPARTMENT_OTHER): Payer: Medicare Other | Admitting: Internal Medicine

## 2023-04-17 ENCOUNTER — Encounter: Payer: Self-pay | Admitting: Internal Medicine

## 2023-04-17 VITALS — BP 154/76 | HR 87 | Temp 97.8°F | Resp 17 | Wt 218.0 lb

## 2023-04-17 DIAGNOSIS — Z5112 Encounter for antineoplastic immunotherapy: Secondary | ICD-10-CM | POA: Diagnosis not present

## 2023-04-17 DIAGNOSIS — C7931 Secondary malignant neoplasm of brain: Secondary | ICD-10-CM | POA: Diagnosis not present

## 2023-04-17 LAB — T4: T4, Total: 10.5 ug/dL (ref 4.5–12.0)

## 2023-04-17 NOTE — Progress Notes (Signed)
Concerns of tongue swelling and right face numbness

## 2023-04-17 NOTE — Progress Notes (Signed)
[facility] at [facility] [address]  [place], [place] [address] [phone]   Interval Evaluation  Date of Service: 04/17/23 Patient Name: Joshua [name] Patient MRN: [id number] Patient DOB: Mar 21, 1956 Provider: Henreitta Leber, MD  Identifying Statement:  Joshua [name] is a 67 y.o. male with Malignant neoplasm metastatic to brain [facility])    Primary Cancer:  Oncologic History: Oncology History  Squamous cell carcinoma of right lung (HCC)  09/18/2020 Imaging   PET scan showed right upper lobe nodule maximal SUV 11.5.  Hypermetabolic cluster right paratracheal adenopathy with maximum SUV up to 12.  No findings of metastatic disease to the neck/abdomen/pelvis or skeleton.   09/20/2020 Initial Diagnosis   Squamous cell carcinoma of right lung (HCC) NGS showed PD-L1 TPS 95%, LRP1B S946fs, PIK3CA E545K, RB1 c1422-2A>T, RIT1 T83_ A84del, STK11 P253fs, TPS53 R248W, TMB 19.66mut/mb, MS stable.    09/20/2020 Cancer Staging   Staging form: Lung, AJCC 8th Edition - Clinical stage from 09/20/2020: Stage IV (cT1b, cN2, cM1) - Signed by [name], MD on 12/13/2021   10/03/2020 Imaging   MRI brain with and without contrast showed no evidence of intracranial metastatic disease. Well-defined round lesion within the left parotid tail measuring approximately 2.3 x 1.8 x 1.8 cm   10/09/2020 - 11/27/2020 Radiation Therapy   concurrent chemoradiation   10/10/2020 - 10/31/2020 Chemotherapy   10/10/2020 concurrent chemoradiation.  carboplatin AUC of 2 and Taxol 45 mg/m2 x 4.  Additional chemotherapy was held due to thrombocytopenia, neutropenia       12/31/2020 - 03/21/2022 Chemotherapy   Maintenance  Durvalumab q14d      12/31/2020 -  Chemotherapy   Patient is on Treatment Plan : LUNG Durvalumab (10) q14d     03/28/2021 Imaging   CT without contrast showed new linear right perihilar consolidation with associated traction bronchiectasis.  Likely postradiation changes.   Additional new peribronchial vascular/nodular groundglass opacities with areas of consolidation or seen primarily in the right upper and lower lobes, more distant from expected radiation fields. 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  Per my discussion with pulmonology Dr. 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     05/09/2021 Imaging   Repeat CT chest without contrast showed evolving radiation changes in the right perihilar region likely accounting for interval increase to thickening of the posterior wall of the right upper lobe bronchus.  The more peripheral right lung groundglass opacities seen on most recent study have generally improved with exception of one peripherally right upper lobe which appears new.  These are likely inflammatory.  No findings highly suspicious for local recurrence.   05/30/2021 - 01/10/2022 Chemotherapy   Durvalumab q14d     10/07/2021 Imaging   MRI brain with and without contrast showed 2 contrast-enhancing lesions, most consistent with metastatic disease.  Largest lesion is in the left frontal lobe and measures up to 1.3 cm with a large amount of surrounding edema and 9 mm rightward midline shift.  The other enhancing lesion was 4 mm in the right occipital lobe with a small amount of surrounding edema.  No acute or chronic hemorrhage.   10/07/2021 Progression   Headache after colonoscopy, progressive difficulty using her right arm.  Progressively worsening of speech. Stage IV lung cancer with brain metastasis.  started on Dexamethasone.  He was seen by neurosurgery and radiation oncology.  Recommendation is  to proceed with SRS.  Patient was discharged with dexamethasone   10/16/2021 Imaging   PET scan showed postradiation changes in the right supra hilar lung without evidence of local recurrence.  No evidence of lung cancer recurrence  or metastatic disease.  Single focus of intense metabolic activity associated with posterior left 11th rib likely secondary to recent fall/posttraumatic metabolic changes.  Frontal  uptake in the left frontal lobe corresponding to the known metastatic lesion in the brain   10/29/2021 -  Radiation Therapy   Brain Radiation [facility]   12/10/2021 Imaging   Brain MRI: Continued interval decrease in size of a left frontal lobe metastasis, now measuring 10 mm. Moderate surrounding edema has also decreased.  3-4 mm metastasis within the right occipital lobe, not significantly changed in size. There is now a small focus of central non enhancement within this lesion. Increased edema. 3 mm enhancing metastasis within the high left parietal lobe. In retrospect, this was likely present as a subtle punctate focus of enhancement on the prior brain MRI of 10/22/2021.   01/18/2022 Imaging   CT chest abdomen pelvis w contrast 1. Stable post treatment changes in the right upper lobe medially with dense radiation fibrosis but no findings to suggest local recurrent tumor. 2. Significant progression of ill-defined ground-glass opacity, interstitial thickening and airspace nodularity in the right upper lobe and upper aspect of the right lower lobe. Findings could reflect an inflammatory or atypical infectious process, drug induced pneumonitis or interstitial spread of tumor.3. No mediastinal or hilar mass or adenopathy. 4. No findings for abdominal/pelvic metastatic disease or osseous metastatic disease.   02/19/2022 Imaging   MRI brain w wo contrast showed Three metastatic deposits in the brain appear smaller. Decreased edema in the left parietal lobe. No new lesions    04/04/2022 Imaging   CT chest w contrast 1. Stable radiation changes in the right perihilar region without evidence of local recurrence. 2. Interval improvement in the patchy ground-glass opacities previously demonstrated in both lungs, most consistent with a  resolving infectious or inflammatory process (including immunotherapy-related pneumonitis). 3. No evidence of metastatic disease.   05/14/2022 Miscellaneous   He had a mechanical fall and fractured his left elbow. xrays showed a left elbow fracture  05/26/22 seen by orthopedic surgeon, cast applied.    05/27/2022 Imaging   MRI brain w wo contrast 1. All three known brain metastases have enlarged since August, with increasing regional edema. And there is a small new left anterior cerebellar 4-5 mm metastasis (was questionably punctate on the May MRI). This constellation could reflect a combination of True Progression (Left Cerebellar lesion) and pseudoprogression/radionecrosis (3 existing mets).    2. No significant intracranial mass effect. 3. Underlying moderate cerebral white matter signal changes.   05/30/2022 Imaging   CT chest abdomen pelvis w contrast  1. 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 There is a stable small right pleural effusion, but no definitive findings to suggest residual or recurrent disease on today's examination. 2. No signs of metastatic disease in the abdomen or pelvis.  3. Aortic atherosclerosis, in addition to two-vessel coronary artery disease. Please note that although the presence of coronary artery calcium documents the presence of coronary artery disease, the severity of this disease and any potential stenosis cannot be assessed on this non-gated CT examination. Assessment for potential risk factor modification, dietary therapy or pharmacologic therapy may be warranted, if clinically indicated.  4. Hepatic steatosis. 5. Colonic diverticulosis without evidence of acute diverticulitis at this time. 6. Incompletely imaged but apparently occluded vascular graft in the superior aspect of the right anterior thigh. Referral to vascular specialist  for further clinical evaluation should be considered if clinically appropriate. 7. Additional incidental findings, as above.   07/22/2022 Imaging   MRI brain  1. No new or enlarging lesions. No intracranial disease progression. 2. Decreased size of treated lesions of the left frontoparietal junction and left postcentral gyrus. 3. Unchanged lesions in the left cerebellum and right occipital lobe.   10/15/2022 Imaging   MRI brain w wo contrast showed Interval increase in size of multiple intraparenchymal lesions with worsened surrounding vasogenic edema. 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 No new lesions   11/05/2022 Imaging   MRI brain w wo contrast  1. Likely slight increase and right occipital and left cerebellar lesions. Otherwise, unchanged metastases, as above. No new lesions identified. 2. Partially imaged large probable lymph node in the left upper neck, apparent on prior PET CT.   11/13/2022 -  Radiation Therapy   Each of the sites were treated with SRS/SBRT/SRT-IMRT technique to 20 Gy in 1 fraction to each of the following sites: PTV_2_LCerebel_30mm PTV_3_RCerebel_46mm PTV_4_ROccip_20mm PTV_5_LParietal_82mm   11/27/2022 Imaging   CT chest abdomen pelvis w contrast showed  Improving bilateral scattered interstitial and ground-glass opacities. Small amount of residual areas in the extreme left lower lobe inferiorly. This specific area is slightly increased from previous but overall the amount of opacities is significantly improved. Recommend continued follow-up. No developing new mass lesion, fluid collection or lymph node enlargement. Stable posttreatment changes right perihilar in the lung. Fatty liver infiltration. Colonic diverticulosis. Improved tiny right pleural effusion   02/05/2023 Imaging   MRI brain with and without contrast Interval decrease in all of the metastatic lesions, detailed above.No  new lesions identified.    Imaging   CT chest abdomen pelvis w contrast showed  1. 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 2. Trace right pleural fluid or thickening, new or minimally increased. 3. Incidental findings, including: Aortic atherosclerosis (ICD10-I70.0), coronary artery atherosclerosis and emphysema (ICD10-J43.9). Hepatic steatosis.    CNS Oncologic History 10/29/21: SRS to left frontal metastasis ([place]) 11/13/22: salvage SRS x5 with Dr. Mitzi Hansen  Interval History: Joshua [name] presents today for follow up after recent MRI brain.  He had brief episodes of right lower facial numbness earlier this week; self limited, did not involve the right hand or arm.  Otherwise no new or progressive changes, still has sporadic headaches.  Continues to ambulate with a cane as prior.  Back on immunotherapy with Dr. Cathie Hoops, no longer dosing decadron.  H+P (06/20/22) Patient presents today for evaluation for neurologic complaints.  He describes gradual onset of impaired speech output and right arm greater than right leg weakness.  This led to a fall and an injury to his left arm.  He was started on decadron two weeks ago, this has led to considerable improvement in symptoms.  Today he feels at his prior baseline.  No issues tolerating the steroids.  He is due for durvalumab infusion today with Dr. Cathie Hoops.   Medications: Current Outpatient Medications on File Prior to Visit  Medication Sig Dispense Refill   aspirin EC 81 MG tablet Take 81 mg by mouth daily. Swallow whole.     clopidogrel (PLAVIX) 75 MG tablet TAKE 1  TABLET BY MOUTH DAILY 100 tablet 2   levothyroxine (SYNTHROID) 100 MCG tablet Take 1 tablet (100 mcg total) by mouth daily before breakfast. 90 tablet 0   levothyroxine (SYNTHROID) 50 MCG tablet Take 0.5 tablets (25 mcg total) by mouth daily before breakfast. Take with his levothyroxine. 45 tablet 0   potassium  chloride SA (KLOR-CON M) 20 MEQ tablet Take 1 tablet (20 mEq total) by mouth daily. 3 tablet 0   simvastatin (ZOCOR) 40 MG tablet Take 40 mg by mouth at bedtime.     Current Facility-Administered Medications on File Prior to Visit  Medication Dose Route Frequency Provider Last Rate Last Admin   albuterol (PROVENTIL) (2.5 MG/3ML) 0.083% nebulizer solution 2.5 mg  2.5 mg Nebulization Once Salena Saner, MD        Allergies: No Known Allergies Past Medical History:  Past Medical History:  Diagnosis Date   Allergy    Cancer (HCC)    lung   COPD (chronic obstructive pulmonary disease) (HCC)    Dyspnea    former smoker. only doe   History of kidney stones    Hypertension    Peripheral vascular disease (HCC)    Pre-diabetes    Sleep apnea    Past Surgical History:  Past Surgical History:  Procedure Laterality Date   ANTERIOR CERVICAL DECOMP/DISCECTOMY FUSION N/A 03/14/2020   Procedure: ANTERIOR CERVICAL DECOMPRESSION/DISCECTOMY FUSION 3 LEVELS C4-7;  Surgeon: Venetia Night, MD;  Location: [facility] ORS;  Service: Neurosurgery;  Laterality: N/A;   BACK SURGERY  2011   CENTRAL LINE INSERTION Right 09/10/2016   Procedure: CENTRAL LINE INSERTION;  Surgeon: Renford Dills, MD;  Location: [facility] ORS;  Service: Vascular;  Laterality: Right;   CERVICAL FUSION     C4-c7 on Mar 14, 2020   COLONOSCOPY  2013 ?   COLONOSCOPY N/A 09/09/2021   Procedure: COLONOSCOPY;  Surgeon: Jaynie Collins, DO;  Location: [facility] ENDOSCOPY;  Service: Gastroenterology;  Laterality: N/A;   CORONARY ANGIOPLASTY     ESOPHAGOGASTRODUODENOSCOPY N/A 09/09/2021   Procedure: ESOPHAGOGASTRODUODENOSCOPY (EGD);  Surgeon: Jaynie Collins, DO;  Location: [facility] ENDOSCOPY;  Service: Gastroenterology;  Laterality: N/A;   FEMORAL-POPLITEAL BYPASS GRAFT Right 09/10/2016   Procedure: BYPASS GRAFT FEMORAL-POPLITEAL ARTERY ( BELOW KNEE );  Surgeon: Renford Dills, MD;  Location: [facility] ORS;  Service: Vascular;   Laterality: Right;   FEMORAL-TIBIAL BYPASS GRAFT Right 03/23/2020   Procedure: BYPASS GRAFT RIGHT FEMORAL- DISTAL TIBIAL ARTERY WITH DISTAL FLOW GRAFT;  Surgeon: Renford Dills, MD;  Location: [facility] ORS;  Service: Vascular;  Laterality: Right;   LEFT HEART CATH AND CORONARY ANGIOGRAPHY Left 05/16/2021   Procedure: LEFT HEART CATH AND CORONARY ANGIOGRAPHY;  Surgeon: Armando Reichert, MD;  Location: [facility] INVASIVE CV LAB;  Service: Cardiovascular;  Laterality: Left;   LOWER EXTREMITY ANGIOGRAPHY Right 11/18/2016   Procedure: Lower Extremity Angiography;  Surgeon: Renford Dills, MD;  Location: [facility] INVASIVE CV LAB;  Service: Cardiovascular;  Laterality: Right;   LOWER EXTREMITY ANGIOGRAPHY Right 12/09/2016   Procedure: Lower Extremity Angiography;  Surgeon: Renford Dills, MD;  Location: [facility] INVASIVE CV LAB;  Service: Cardiovascular;  Laterality: Right;   LOWER EXTREMITY ANGIOGRAPHY Right 11/15/2019   Procedure: LOWER EXTREMITY ANGIOGRAPHY;  Surgeon: Renford Dills, MD;  Location: [facility] INVASIVE CV LAB;  Service: Cardiovascular;  Laterality: Right;   LOWER EXTREMITY ANGIOGRAPHY Right 12/01/2019   Procedure: Lower Extremity Angiography;  Surgeon: Annice Needy, MD;  Location: [facility] INVASIVE CV LAB;  Service: Cardiovascular;  Laterality: Right;   LOWER EXTREMITY ANGIOGRAPHY Right 12/02/2019   Procedure: LOWER EXTREMITY ANGIOGRAPHY;  Surgeon: Renford Dills, MD;  Location: [facility] INVASIVE CV LAB;  Service: Cardiovascular;  Laterality: Right;   LOWER EXTREMITY ANGIOGRAPHY Right 03/23/2020   Procedure: Lower Extremity Angiography;  Surgeon: Renford Dills, MD;  Location: [facility] INVASIVE CV LAB;  Service: Cardiovascular;  Laterality: Right;   LOWER EXTREMITY ANGIOGRAPHY Right 09/25/2021   Procedure: Lower Extremity Angiography;  Surgeon: Renford Dills, MD;  Location: [facility] INVASIVE CV LAB;  Service: Cardiovascular;  Laterality: Right;   LOWER EXTREMITY ANGIOGRAPHY Right 09/26/2021   Procedure:  Lower Extremity Angiography;  Surgeon: Annice Needy, MD;  Location: [facility] INVASIVE CV LAB;  Service: Cardiovascular;  Laterality: Right;   LOWER EXTREMITY ANGIOGRAPHY Right 04/08/2022   Procedure: Lower Extremity Angiography;  Surgeon: Renford Dills, MD;  Location: [facility] INVASIVE CV LAB;  Service: Cardiovascular;  Laterality: Right;   LOWER EXTREMITY INTERVENTION  11/18/2016   Procedure: Lower Extremity Intervention;  Surgeon: Renford Dills, MD;  Location: [facility] INVASIVE CV LAB;  Service: Cardiovascular;;   PERIPHERAL VASCULAR CATHETERIZATION Right 01/02/2015   Procedure: Lower Extremity Angiography;  Surgeon: Renford Dills, MD;  Location: [facility] INVASIVE CV LAB;  Service: Cardiovascular;  Laterality: Right;   PERIPHERAL VASCULAR CATHETERIZATION Right 06/18/2015   Procedure: Lower Extremity Angiography;  Surgeon: Annice Needy, MD;  Location: [facility] INVASIVE CV LAB;  Service: Cardiovascular;  Laterality: Right;   PERIPHERAL VASCULAR CATHETERIZATION  06/18/2015   Procedure: Lower Extremity Intervention;  Surgeon: Annice Needy, MD;  Location: [facility] INVASIVE CV LAB;  Service: Cardiovascular;;   PERIPHERAL VASCULAR CATHETERIZATION N/A 10/09/2015   Procedure: Abdominal Aortogram w/Lower Extremity;  Surgeon: Renford Dills, MD;  Location: [facility] INVASIVE CV LAB;  Service: Cardiovascular;  Laterality: N/A;   PERIPHERAL VASCULAR CATHETERIZATION  10/09/2015   Procedure: Lower Extremity Intervention;  Surgeon: Renford Dills, MD;  Location: [facility] INVASIVE CV LAB;  Service: Cardiovascular;;   PERIPHERAL VASCULAR CATHETERIZATION Right 10/10/2015   Procedure: Lower Extremity Angiography;  Surgeon: Annice Needy, MD;  Location: [facility] INVASIVE CV LAB;  Service: Cardiovascular;  Laterality: Right;   PERIPHERAL VASCULAR CATHETERIZATION Right 10/10/2015   Procedure: Lower Extremity Intervention;  Surgeon: Annice Needy, MD;  Location: [facility] INVASIVE CV LAB;  Service: Cardiovascular;  Laterality: Right;   PERIPHERAL VASCULAR  CATHETERIZATION Right 12/03/2015   Procedure: Lower Extremity Angiography;  Surgeon: Annice Needy, MD;  Location: [facility] INVASIVE CV LAB;  Service: Cardiovascular;  Laterality: Right;   PERIPHERAL VASCULAR CATHETERIZATION Right 12/04/2015   Procedure: Lower Extremity Angiography;  Surgeon: Annice Needy, MD;  Location: [facility] INVASIVE CV LAB;  Service: Cardiovascular;  Laterality: Right;   PERIPHERAL VASCULAR CATHETERIZATION  12/04/2015   Procedure: Lower Extremity Intervention;  Surgeon: Annice Needy, MD;  Location: [facility] INVASIVE CV LAB;  Service: Cardiovascular;;   PERIPHERAL VASCULAR CATHETERIZATION Right 06/30/2016   Procedure: Lower Extremity Angiography;  Surgeon: Annice Needy, MD;  Location: [facility] INVASIVE CV LAB;  Service: Cardiovascular;  Laterality: Right;   PERIPHERAL VASCULAR CATHETERIZATION Right 06/25/2016   Procedure: Lower Extremity Angiography;  Surgeon: Renford Dills, MD;  Location: [facility] INVASIVE CV LAB;  Service: Cardiovascular;  Laterality: Right;   PERIPHERAL VASCULAR CATHETERIZATION Right 07/01/2016   Procedure: Lower Extremity Angiography;  Surgeon: Renford Dills, MD;  Location: [facility] INVASIVE CV LAB;  Service: Cardiovascular;  Laterality: Right;   PERIPHERAL VASCULAR CATHETERIZATION  07/01/2016   Procedure: Lower Extremity Intervention;  Surgeon: Renford Dills,  MD;  Location: [facility] INVASIVE CV LAB;  Service: Cardiovascular;;   PERIPHERAL VASCULAR CATHETERIZATION Right 08/01/2016   Procedure: Lower Extremity Angiography;  Surgeon: Renford Dills, MD;  Location: [facility] INVASIVE CV LAB;  Service: Cardiovascular;  Laterality: Right;   PORTA CATH INSERTION N/A 10/05/2020   Procedure: PORTA CATH INSERTION;  Surgeon: Renford Dills, MD;  Location: [facility] INVASIVE CV LAB;  Service: Cardiovascular;  Laterality: N/A;   stent placement in right leg Right    VIDEO BRONCHOSCOPY N/A 09/14/2020   Procedure: VIDEO BRONCHOSCOPY WITH FLUORO;  Surgeon: Salena Saner, MD;  Location: [facility]  ORS;  Service: Cardiopulmonary;  Laterality: N/A;   VIDEO BRONCHOSCOPY WITH ENDOBRONCHIAL ULTRASOUND N/A 09/14/2020   Procedure: VIDEO BRONCHOSCOPY WITH ENDOBRONCHIAL ULTRASOUND;  Surgeon: Salena Saner, MD;  Location: [facility] ORS;  Service: Cardiopulmonary;  Laterality: N/A;   Social History:  Social History   Socioeconomic History   Marital status: Single    Spouse name: Not on file   Number of children: Not on file   Years of education: Not on file   Highest education level: Not on file  Occupational History   Occupation: unemployed  Tobacco Use   Smoking status: Former    Current packs/day: 0.00    Average packs/day: 2.0 packs/day for 38.0 years (76.0 ttl pk-yrs)    Types: Cigarettes    Start date: 12/19/1976    Quit date: 12/20/2014    Years since quitting: 8.3   Smokeless tobacco: Never   Tobacco comments:    quit smoking in 2017 most smoked 2   Vaping Use   Vaping status: Former  Substance and Sexual Activity   Alcohol use: Not Currently   Drug use: No   Sexual activity: Yes  Other Topics Concern   Not on file  Social History Narrative   Not on file   Social Determinants of Health   Financial Resource Strain: Low Risk  (12/29/2022)   Received from [facility], [facility]   Overall Financial Resource Strain (CARDIA)    Difficulty of Paying Living Expenses: Not hard at all  Food Insecurity: No Food Insecurity (12/29/2022)   Received from [facility], [facility]   Hunger Vital Sign    Worried About Running Out of Food in the Last Year: Never true    Ran Out of Food in the Last Year: Never true  Transportation Needs: No Transportation Needs (12/29/2022)   Received from [facility], [facility]   [facility] - Transportation    In the past 12 months, has lack of transportation kept you from medical appointments or from getting medications?: No    Lack of  Transportation (Non-Medical): No  Physical Activity: Not on file  Stress: Not on file  Social Connections: Not on file  Intimate Partner Violence: Not on file   Family History:  Family History  Problem Relation Age of Onset   Diabetes Mother    Hypertension Mother    Heart murmur Mother    Leukemia Mother    Throat cancer Maternal Grandmother    Colon cancer Neg Hx    Breast cancer Neg Hx     Review of Systems: Constitutional: Doesn't report fevers, chills or abnormal weight loss Eyes: Doesn't report blurriness of vision Ears, nose, mouth, throat, and face: Doesn't report sore throat Respiratory: Doesn't report cough, dyspnea or wheezes Cardiovascular: Doesn't report palpitation, chest discomfort  Gastrointestinal:  Doesn't report nausea, constipation,  diarrhea GU: Doesn't report incontinence Skin: Doesn't report skin rashes Neurological: Per HPI Musculoskeletal: Doesn't report joint pain Behavioral/Psych: Doesn't report anxiety  Physical Exam: Vitals:   04/17/23 1003  BP: (!) 154/76  Pulse: 87  Resp: 17  Temp: 97.8 F (36.6 C)  SpO2: 95%    KPS: 80. General: Alert, cooperative, pleasant, in no acute distress Head: Normal EENT: No conjunctival injection or scleral icterus.  Lungs: Resp effort normal Cardiac: Regular rate Abdomen: Non-distended abdomen Skin: No rashes cyanosis or petechiae. Extremities: Left arm in sling  Neurologic Exam: Mental Status: Awake, alert, attentive to examiner. Oriented to self and environment. Language is fluent with intact comprehension.  Cranial Nerves: Visual acuity is grossly normal. Visual fields are full. Extra-ocular movements intact. No ptosis. Face is symmetric Motor: Tone and bulk are normal. Power is full in both arms and legs. Reflexes are symmetric, no pathologic reflexes present.  Sensory: Intact to light touch Gait: Normal.   Labs: I have reviewed the data as listed    Component Value Date/Time   NA 138  04/16/2023 1302   NA 141 10/14/2019 1101   K 3.4 (L) 04/16/2023 1302   CL 107 04/16/2023 1302   CO2 24 04/16/2023 1302   GLUCOSE 122 (H) 04/16/2023 1302   BUN 14 04/16/2023 1302   BUN 15 10/14/2019 1101   BUN 16 07/03/2014 1205   CREATININE 1.11 04/16/2023 1302   CREATININE 1.11 11/06/2022 1041   CREATININE 1.34 (H) 07/03/2014 1205   CALCIUM 8.6 (L) 04/16/2023 1302   PROT 6.8 04/16/2023 1302   PROT 7.0 04/16/2020 1525   ALBUMIN 3.8 04/16/2023 1302   ALBUMIN 4.3 04/16/2020 1525   AST 21 04/16/2023 1302   ALT 15 04/16/2023 1302   ALKPHOS 82 04/16/2023 1302   BILITOT 0.6 04/16/2023 1302   BILITOT 0.4 04/16/2020 1525   GFRNONAA >60 04/16/2023 1302   GFRNONAA >60 11/06/2022 1041   GFRNONAA 59 (L) 07/03/2014 1205   GFRAA >60 03/30/2020 0536   GFRAA >60 07/03/2014 1205   Lab Results  Component Value Date   WBC 4.9 04/16/2023   NEUTROABS 3.1 04/16/2023   HGB 12.6 (L) 04/16/2023   HCT 39.6 04/16/2023   MCV 84.8 04/16/2023   PLT 213 04/16/2023    Imaging:  [facility] Clinician Interpretation: I have personally reviewed the CNS images as listed.  My interpretation, in the context of the patient's clinical presentation, is stable disease pending official read.  No results found.  Assessment/Plan Malignant neoplasm metastatic to brain [facility])  Joshua [name] is clinically stable today, now 5 months removed from salvage SRS to 5 additional metastatic foci.    MRI brain demonstrates mostly stable findings, pending official read.  The high left frontal lesion may be 1-29mm larger, and there is some increase in T2/FLAIR signal within left temporal lobe.  These are within expectation for treatment effect only, at this point.  Recommended remaining off decadron if tolerated.    We appreciate the opportunity to participate in the care of Joshua [name].   We ask that Joshua [name] return to clinic in 3 months with brain MRI, or sooner as needed.  All questions were  answered. The patient knows to call the clinic with any problems, questions or concerns. No barriers to learning were detected.  The total time spent in the encounter was 40 minutes and more than 50% was on counseling and review of test results   Henreitta Leber, MD Medical Director of Neuro-Oncology  [facility] 04/17/23 10:18 AM

## 2023-04-18 ENCOUNTER — Other Ambulatory Visit: Payer: Self-pay

## 2023-04-30 ENCOUNTER — Inpatient Hospital Stay: Payer: Medicare Other

## 2023-04-30 ENCOUNTER — Inpatient Hospital Stay: Payer: Medicare Other | Attending: Oncology

## 2023-04-30 ENCOUNTER — Encounter: Payer: Self-pay | Admitting: Oncology

## 2023-04-30 ENCOUNTER — Inpatient Hospital Stay (HOSPITAL_BASED_OUTPATIENT_CLINIC_OR_DEPARTMENT_OTHER): Payer: Medicare Other | Admitting: Oncology

## 2023-04-30 VITALS — BP 145/72 | HR 83

## 2023-04-30 DIAGNOSIS — Z5112 Encounter for antineoplastic immunotherapy: Secondary | ICD-10-CM | POA: Insufficient documentation

## 2023-04-30 DIAGNOSIS — C3491 Malignant neoplasm of unspecified part of right bronchus or lung: Secondary | ICD-10-CM

## 2023-04-30 DIAGNOSIS — C3411 Malignant neoplasm of upper lobe, right bronchus or lung: Secondary | ICD-10-CM | POA: Insufficient documentation

## 2023-04-30 DIAGNOSIS — C7931 Secondary malignant neoplasm of brain: Secondary | ICD-10-CM | POA: Insufficient documentation

## 2023-04-30 DIAGNOSIS — E876 Hypokalemia: Secondary | ICD-10-CM

## 2023-04-30 DIAGNOSIS — E032 Hypothyroidism due to medicaments and other exogenous substances: Secondary | ICD-10-CM

## 2023-04-30 LAB — CBC WITH DIFFERENTIAL/PLATELET
Abs Immature Granulocytes: 0.01 10*3/uL (ref 0.00–0.07)
Basophils Absolute: 0.1 10*3/uL (ref 0.0–0.1)
Basophils Relative: 1 %
Eosinophils Absolute: 0.2 10*3/uL (ref 0.0–0.5)
Eosinophils Relative: 3 %
HCT: 39.5 % (ref 39.0–52.0)
Hemoglobin: 13.2 g/dL (ref 13.0–17.0)
Immature Granulocytes: 0 %
Lymphocytes Relative: 15 %
Lymphs Abs: 0.9 10*3/uL (ref 0.7–4.0)
MCH: 27.3 pg (ref 26.0–34.0)
MCHC: 33.4 g/dL (ref 30.0–36.0)
MCV: 81.6 fL (ref 80.0–100.0)
Monocytes Absolute: 0.5 10*3/uL (ref 0.1–1.0)
Monocytes Relative: 9 %
Neutro Abs: 4.1 10*3/uL (ref 1.7–7.7)
Neutrophils Relative %: 72 %
Platelets: 242 10*3/uL (ref 150–400)
RBC: 4.84 MIL/uL (ref 4.22–5.81)
RDW: 13.3 % (ref 11.5–15.5)
WBC: 5.7 10*3/uL (ref 4.0–10.5)
nRBC: 0 % (ref 0.0–0.2)

## 2023-04-30 LAB — COMPREHENSIVE METABOLIC PANEL
ALT: 18 U/L (ref 0–44)
AST: 28 U/L (ref 15–41)
Albumin: 4 g/dL (ref 3.5–5.0)
Alkaline Phosphatase: 82 U/L (ref 38–126)
Anion gap: 12 (ref 5–15)
BUN: 17 mg/dL (ref 8–23)
CO2: 23 mmol/L (ref 22–32)
Calcium: 9.1 mg/dL (ref 8.9–10.3)
Chloride: 103 mmol/L (ref 98–111)
Creatinine, Ser: 1.16 mg/dL (ref 0.61–1.24)
GFR, Estimated: >60 mL/min (ref >60)
Glucose, Bld: 160 mg/dL — ABNORMAL HIGH (ref 70–99)
Potassium: 3.2 mmol/L — ABNORMAL LOW (ref 3.5–5.1)
Sodium: 138 mmol/L (ref 135–145)
Total Bilirubin: 0.6 mg/dL (ref 0.3–1.2)
Total Protein: 7 g/dL (ref 6.5–8.1)

## 2023-04-30 MED ORDER — SODIUM CHLORIDE 0.9 % IV SOLN
Freq: Once | INTRAVENOUS | Status: AC
  Filled 2023-04-30: qty 250

## 2023-04-30 MED ORDER — HEPARIN SOD (PORK) LOCK FLUSH 100 UNIT/ML IV SOLN
500.0000 [IU] | Freq: Once | INTRAVENOUS | Status: AC | PRN
  Administered 2023-04-30: 500 [IU]
  Filled 2023-04-30: qty 5

## 2023-04-30 MED ORDER — SODIUM CHLORIDE 0.9 % IV SOLN
10.0000 mg/kg | Freq: Once | INTRAVENOUS | Status: AC
  Administered 2023-04-30: 1000 mg via INTRAVENOUS
  Filled 2023-04-30: qty 20

## 2023-04-30 MED ORDER — POTASSIUM CHLORIDE CRYS ER 10 MEQ PO TBCR: 10.0000 meq | EXTENDED_RELEASE_TABLET | Freq: Two times a day (BID) | ORAL | 1 refills | Status: DC

## 2023-04-30 NOTE — Assessment & Plan Note (Signed)
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  May 2024 CT scan showed Improving bilateral scattered interstitial and ground-glass opacities. Discussed with pulmonology Dr. Jayme Cloud. 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  Labs are reviewed and discussed with patient. Proceed with Durvalumab.

## 2023-04-30 NOTE — Progress Notes (Signed)
Hematology/Oncology Progress note [phone] [phone]    CHIEF COMPLAINTS/REASON FOR VISIT:  Follow up for lung cancer   ASSESSMENT & PLAN:   Cancer Staging  Squamous cell carcinoma of right lung (HCC) Staging form: Lung, AJCC 8th Edition - Clinical stage from 09/20/2020: Stage IV (cT1b, cN2, cM1) - Signed by [name], MD on 12/13/2021   Squamous cell carcinoma of right lung (HCC) Durvalumab was previously held due to pneumonitis.  May 2024 CT scan showed Improving bilateral scattered interstitial and ground-glass opacities. Discussed with pulmonology Dr. Jayme Cloud. 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  Labs are reviewed and discussed with patient. Proceed with Durvalumab.    Hypothyroidism due to medication normal TSH with normal T4.  continue levothyroxine 125 mcg daily    Encounter for antineoplastic immunotherapy Durvalumab treatment as planned.   Malignant neoplasm metastatic to brain [facility]) Status post radiation,  02/19/22 MRI showed decreased brain lesion size and edema.  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 Mitzi Hansen.  MRI on 02/05/23 stable improved -  04/16/23 MRI brain pending official reading.  Follow up with Dr. Barbaraann Cao.    Hypokalemia Recommend potassium KCL daily     Orders Placed This Encounter  Procedures   T4    Standing Status:   Future    Standing Expiration Date:   06/10/2024   TSH    Standing Status:   Future    Standing Expiration Date:   06/10/2024   CBC with Differential    Standing Status:   Future    Standing Expiration Date:    06/10/2024   Comprehensive metabolic panel    Standing Status:   Future    Standing Expiration Date:   06/10/2024    Follow up 2 weeks lab MD Durvalumab All questions were answered. The patient knows to call the clinic with any problems, questions or concerns.  [name], MD, PhD [facility] Hematology Oncology 04/30/2023       HISTORY OF PRESENTING ILLNESS:  CHASTEN BLAZE is a 67 y.o. male who has above history reviewed by me today presents for follow up visit for Stage IV lung squamous cell carcinoma. Oncology History  Squamous cell carcinoma of right lung (HCC)  09/18/2020 Imaging   PET scan showed right upper lobe nodule maximal SUV 11.5.  Hypermetabolic cluster right paratracheal adenopathy with maximum SUV up to 12.  No findings of metastatic disease to the neck/abdomen/pelvis or skeleton.   09/20/2020 Initial Diagnosis   Squamous cell carcinoma of right lung (HCC) NGS showed PD-L1 TPS 95%, LRP1B S972fs, PIK3CA E545K, RB1 c1422-2A>T, RIT1 T83_ A84del, STK11 P271fs, TPS53 R248W, TMB 19.61mut/mb, MS stable.    09/20/2020 Cancer Staging   Staging form: Lung, AJCC 8th Edition - Clinical stage from 09/20/2020: Stage IV (cT1b, cN2, cM1) - Signed by [name], MD on 12/13/2021   10/03/2020 Imaging   MRI brain with and without contrast showed no evidence of intracranial metastatic disease. Well-defined round lesion within the left parotid tail measuring approximately 2.3 x 1.8 x 1.8 cm   10/09/2020 - 11/27/2020 Radiation Therapy   concurrent chemoradiation   10/10/2020 - 10/31/2020 Chemotherapy   10/10/2020 concurrent chemoradiation.  carboplatin AUC of 2 and Taxol 45 mg/m2 x 4.  Additional chemotherapy was held due to thrombocytopenia, neutropenia       12/31/2020 - 03/21/2022 Chemotherapy   Maintenance  Durvalumab q14d      12/31/2020 -  Chemotherapy   Patient is on Treatment Plan : LUNG Durvalumab (10) q14d     03/28/2021 Imaging   CT without contrast showed new linear right  perihilar consolidation with associated traction bronchiectasis.  Likely postradiation changes.  Additional new peribronchial vascular/nodular groundglass opacities with areas of consolidation or seen primarily in the right upper and lower lobes, more distant from expected radiation fields. 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  Per my discussion with pulmonology Dr. 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     05/09/2021 Imaging   Repeat CT chest without contrast showed evolving radiation changes in the right perihilar region likely accounting for interval increase to thickening of the posterior wall of the right upper lobe bronchus.  The more peripheral right lung groundglass opacities seen on most recent study have generally improved with exception of one peripherally right upper lobe which appears new.  These are likely inflammatory.  No findings highly suspicious for local recurrence.   05/30/2021 - 01/10/2022 Chemotherapy   Durvalumab q14d     10/07/2021 Imaging   MRI brain with and without contrast showed 2 contrast-enhancing lesions, most consistent with metastatic disease.  Largest lesion is in the left frontal lobe and measures up to 1.3 cm with a large amount of surrounding edema and 9 mm rightward midline shift.  The other enhancing lesion was 4 mm in the right occipital lobe with a small amount of surrounding edema.  No acute or chronic hemorrhage.   10/07/2021 Progression   Headache after colonoscopy, progressive difficulty using her right arm.  Progressively worsening of speech. Stage IV lung cancer with brain metastasis.  started on Dexamethasone.  He was seen by neurosurgery and radiation oncology.  Recommendation is to proceed with SRS.  Patient was discharged with dexamethasone   10/16/2021 Imaging   PET scan showed postradiation changes in the  right supra hilar lung without evidence of local recurrence.  No evidence of lung cancer recurrence or metastatic disease.  Single focus of intense metabolic activity associated with posterior left 11th rib likely secondary to recent fall/posttraumatic metabolic changes.  Frontal  uptake in the left frontal lobe corresponding to the known metastatic lesion in the brain   10/29/2021 -  Radiation Therapy   Brain Radiation [facility]   12/10/2021 Imaging   Brain MRI: Continued interval decrease in size of a left frontal lobe metastasis, now measuring 10 mm. Moderate surrounding edema has also decreased.  3-4 mm metastasis within the right occipital lobe, not significantly changed in size. There is now a small focus of central non enhancement within this lesion. Increased edema. 3 mm enhancing metastasis within the high left parietal lobe. In retrospect, this was likely present as a subtle punctate focus of enhancement on the prior brain MRI of 10/22/2021.   01/18/2022 Imaging   CT chest abdomen pelvis w contrast 1. Stable post treatment changes in the right upper lobe medially with dense radiation fibrosis but no findings to suggest local recurrent tumor. 2. Significant progression of ill-defined ground-glass opacity, interstitial thickening and airspace nodularity in the right upper lobe and upper aspect of the right lower lobe. Findings could reflect  an inflammatory or atypical infectious process, drug induced pneumonitis or interstitial spread of tumor.3. No mediastinal or hilar mass or adenopathy. 4. No findings for abdominal/pelvic metastatic disease or osseous metastatic disease.   02/19/2022 Imaging   MRI brain w wo contrast showed Three metastatic deposits in the brain appear smaller. Decreased edema in the left parietal lobe. No new lesions    04/04/2022 Imaging   CT chest w contrast 1. Stable radiation changes in the right perihilar region without evidence of local recurrence. 2. Interval improvement  in the patchy ground-glass opacities previously demonstrated in both lungs, most consistent with a resolving infectious or inflammatory process (including immunotherapy-related pneumonitis). 3. No evidence of metastatic disease.   05/14/2022 Miscellaneous   He had a mechanical fall and fractured his left elbow. xrays showed a left elbow fracture  05/26/22 seen by orthopedic surgeon, cast applied.    05/27/2022 Imaging   MRI brain w wo contrast 1. All three known brain metastases have enlarged since August, with increasing regional edema. And there is a small new left anterior cerebellar 4-5 mm metastasis (was questionably punctate on the May MRI). This constellation could reflect a combination of True Progression (Left Cerebellar lesion) and pseudoprogression/radionecrosis (3 existing mets).    2. No significant intracranial mass effect. 3. Underlying moderate cerebral white matter signal changes.   05/30/2022 Imaging   CT chest abdomen pelvis w contrast  1. 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 There is a stable small right pleural effusion, but no definitive findings to suggest residual or recurrent disease on today's examination. 2. No signs of metastatic disease in the abdomen or pelvis.  3. Aortic atherosclerosis, in addition to two-vessel coronary artery disease. Please note that although the presence of coronary artery calcium documents the presence of coronary artery disease, the severity of this disease and any potential stenosis cannot be assessed on this non-gated CT examination. Assessment for potential risk factor modification, dietary therapy or pharmacologic therapy may be warranted, if clinically indicated. 4. Hepatic steatosis. 5. Colonic diverticulosis without evidence of acute diverticulitis at this time. 6. Incompletely imaged but apparently occluded  vascular graft in the superior aspect of the right anterior thigh. Referral to vascular specialist for further clinical evaluation should be considered if clinically appropriate. 7. Additional incidental findings, as above.   07/22/2022 Imaging   MRI brain  1. No new or enlarging lesions. No intracranial disease progression. 2. Decreased size of treated lesions of the left frontoparietal junction and left postcentral gyrus. 3. Unchanged lesions in the left cerebellum and right occipital lobe.   10/15/2022 Imaging   MRI brain w wo contrast showed Interval increase in size of multiple intraparenchymal lesions with worsened surrounding vasogenic edema. 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 No new lesions   11/05/2022 Imaging   MRI brain w wo contrast  1. Likely slight increase and right occipital and left cerebellar lesions. Otherwise, unchanged metastases, as above. No new lesions identified. 2. Partially imaged large probable lymph node in the left upper neck, apparent on prior PET CT.   11/13/2022 -  Radiation Therapy   Each of the sites were treated with SRS/SBRT/SRT-IMRT technique to 20 Gy in 1 fraction to each of the following sites: PTV_2_LCerebel_27mm PTV_3_RCerebel_75mm PTV_4_ROccip_15mm PTV_5_LParietal_110mm   11/27/2022 Imaging   CT chest abdomen pelvis w contrast showed  Improving bilateral scattered interstitial and ground-glass opacities.  Small amount of residual areas in the extreme left lower lobe inferiorly. This specific area is slightly increased from previous but overall the amount of opacities is significantly improved. Recommend continued follow-up. No developing new mass lesion, fluid collection or lymph node enlargement. Stable posttreatment changes right perihilar in the lung. Fatty liver infiltration. Colonic diverticulosis. Improved tiny right pleural effusion   02/05/2023 Imaging   MRI  brain with and without contrast Interval decrease in all of the metastatic lesions, detailed above.No new lesions identified.    Imaging   CT chest abdomen pelvis w contrast showed  1. 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 2. Trace right pleural fluid or thickening, new or minimally increased. 3. Incidental findings, including: Aortic atherosclerosis (ICD10-I70.0), coronary artery atherosclerosis and emphysema (ICD10-J43.9). Hepatic steatosis.    Today he feels well.Intermittent headache, is better. denies any focal deficits. chronic SOB,not worse.  MRI brain scheduled in Oct 2024. Official result is pending.   In addition to the thyroid and potassium issues, the patient is undergoing immunotherapy. They report no significant side effects from this treatment. No new complaints.    Review of Systems  Constitutional:  Positive for fatigue. Negative for appetite change, chills, fever and unexpected weight change.  HENT:   Negative for hearing loss and voice change.   Eyes:  Negative for eye problems and icterus.  Respiratory:  Negative for chest tightness, cough and shortness of breath.   Cardiovascular:  Negative for chest pain and leg swelling.  Gastrointestinal:  Negative for abdominal distention and abdominal pain.  Endocrine: Negative for hot flashes.  Genitourinary:  Negative for difficulty urinating, dysuria and frequency.   Musculoskeletal:  Negative for arthralgias.  Skin:  Negative for itching and rash.  Neurological:  Positive for extremity weakness. Negative for headaches, light-headedness, numbness and speech difficulty.       + tongue was thick and right face numbness.  Hematological:  Negative for adenopathy. Does not bruise/bleed easily.  Psychiatric/Behavioral:  Negative for confusion.       MEDICAL HISTORY:  Past Medical History:  Diagnosis Date   Allergy    Cancer (HCC)    lung   COPD  (chronic obstructive pulmonary disease) (HCC)    Dyspnea    former smoker. only doe   History of kidney stones    Hypertension    Peripheral vascular disease (HCC)    Pre-diabetes    Sleep apnea     SURGICAL HISTORY: Past Surgical History:  Procedure Laterality Date   ANTERIOR CERVICAL DECOMP/DISCECTOMY FUSION N/A 03/14/2020   Procedure: ANTERIOR CERVICAL DECOMPRESSION/DISCECTOMY FUSION 3 LEVELS C4-7;  Surgeon: Venetia Night, MD;  Location: [facility] ORS;  Service: Neurosurgery;  Laterality: N/A;   BACK SURGERY  2011   CENTRAL LINE INSERTION Right 09/10/2016   Procedure: CENTRAL LINE INSERTION;  Surgeon: Renford Dills, MD;  Location: [facility] ORS;  Service: Vascular;  Laterality: Right;   CERVICAL FUSION     C4-c7 on Mar 14, 2020   COLONOSCOPY  2013 ?   COLONOSCOPY N/A 09/09/2021   Procedure: COLONOSCOPY;  Surgeon: Jaynie Collins, DO;  Location: [facility] ENDOSCOPY;  Service: Gastroenterology;  Laterality: N/A;   CORONARY ANGIOPLASTY     ESOPHAGOGASTRODUODENOSCOPY N/A 09/09/2021   Procedure: ESOPHAGOGASTRODUODENOSCOPY (EGD);  Surgeon: Jaynie Collins, DO;  Location: [facility] ENDOSCOPY;  Service: Gastroenterology;  Laterality: N/A;   FEMORAL-POPLITEAL BYPASS GRAFT Right 09/10/2016   Procedure: BYPASS GRAFT FEMORAL-POPLITEAL ARTERY ( BELOW KNEE );  Surgeon: Gilda Crease,  Latina Craver, MD;  Location: [facility] ORS;  Service: Vascular;  Laterality: Right;   FEMORAL-TIBIAL BYPASS GRAFT Right 03/23/2020   Procedure: BYPASS GRAFT RIGHT FEMORAL- DISTAL TIBIAL ARTERY WITH DISTAL FLOW GRAFT;  Surgeon: Renford Dills, MD;  Location: [facility] ORS;  Service: Vascular;  Laterality: Right;   LEFT HEART CATH AND CORONARY ANGIOGRAPHY Left 05/16/2021   Procedure: LEFT HEART CATH AND CORONARY ANGIOGRAPHY;  Surgeon: Armando Reichert, MD;  Location: [facility] INVASIVE CV LAB;  Service: Cardiovascular;  Laterality: Left;   LOWER EXTREMITY ANGIOGRAPHY Right 11/18/2016   Procedure: Lower Extremity Angiography;  Surgeon:  Renford Dills, MD;  Location: [facility] INVASIVE CV LAB;  Service: Cardiovascular;  Laterality: Right;   LOWER EXTREMITY ANGIOGRAPHY Right 12/09/2016   Procedure: Lower Extremity Angiography;  Surgeon: Renford Dills, MD;  Location: [facility] INVASIVE CV LAB;  Service: Cardiovascular;  Laterality: Right;   LOWER EXTREMITY ANGIOGRAPHY Right 11/15/2019   Procedure: LOWER EXTREMITY ANGIOGRAPHY;  Surgeon: Renford Dills, MD;  Location: [facility] INVASIVE CV LAB;  Service: Cardiovascular;  Laterality: Right;   LOWER EXTREMITY ANGIOGRAPHY Right 12/01/2019   Procedure: Lower Extremity Angiography;  Surgeon: Annice Needy, MD;  Location: [facility] INVASIVE CV LAB;  Service: Cardiovascular;  Laterality: Right;   LOWER EXTREMITY ANGIOGRAPHY Right 12/02/2019   Procedure: LOWER EXTREMITY ANGIOGRAPHY;  Surgeon: Renford Dills, MD;  Location: [facility] INVASIVE CV LAB;  Service: Cardiovascular;  Laterality: Right;   LOWER EXTREMITY ANGIOGRAPHY Right 03/23/2020   Procedure: Lower Extremity Angiography;  Surgeon: Renford Dills, MD;  Location: [facility] INVASIVE CV LAB;  Service: Cardiovascular;  Laterality: Right;   LOWER EXTREMITY ANGIOGRAPHY Right 09/25/2021   Procedure: Lower Extremity Angiography;  Surgeon: Renford Dills, MD;  Location: [facility] INVASIVE CV LAB;  Service: Cardiovascular;  Laterality: Right;   LOWER EXTREMITY ANGIOGRAPHY Right 09/26/2021   Procedure: Lower Extremity Angiography;  Surgeon: Annice Needy, MD;  Location: [facility] INVASIVE CV LAB;  Service: Cardiovascular;  Laterality: Right;   LOWER EXTREMITY ANGIOGRAPHY Right 04/08/2022   Procedure: Lower Extremity Angiography;  Surgeon: Renford Dills, MD;  Location: [facility] INVASIVE CV LAB;  Service: Cardiovascular;  Laterality: Right;   LOWER EXTREMITY INTERVENTION  11/18/2016   Procedure: Lower Extremity Intervention;  Surgeon: Renford Dills, MD;  Location: [facility] INVASIVE CV LAB;  Service: Cardiovascular;;   PERIPHERAL VASCULAR CATHETERIZATION Right 01/02/2015    Procedure: Lower Extremity Angiography;  Surgeon: Renford Dills, MD;  Location: [facility] INVASIVE CV LAB;  Service: Cardiovascular;  Laterality: Right;   PERIPHERAL VASCULAR CATHETERIZATION Right 06/18/2015   Procedure: Lower Extremity Angiography;  Surgeon: Annice Needy, MD;  Location: [facility] INVASIVE CV LAB;  Service: Cardiovascular;  Laterality: Right;   PERIPHERAL VASCULAR CATHETERIZATION  06/18/2015   Procedure: Lower Extremity Intervention;  Surgeon: Annice Needy, MD;  Location: [facility] INVASIVE CV LAB;  Service: Cardiovascular;;   PERIPHERAL VASCULAR CATHETERIZATION N/A 10/09/2015   Procedure: Abdominal Aortogram w/Lower Extremity;  Surgeon: Renford Dills, MD;  Location: [facility] INVASIVE CV LAB;  Service: Cardiovascular;  Laterality: N/A;   PERIPHERAL VASCULAR CATHETERIZATION  10/09/2015   Procedure: Lower Extremity Intervention;  Surgeon: Renford Dills, MD;  Location: [facility] INVASIVE CV LAB;  Service: Cardiovascular;;   PERIPHERAL VASCULAR CATHETERIZATION Right 10/10/2015   Procedure: Lower Extremity Angiography;  Surgeon: Annice Needy, MD;  Location: [facility] INVASIVE CV LAB;  Service: Cardiovascular;  Laterality: Right;   PERIPHERAL VASCULAR CATHETERIZATION Right 10/10/2015   Procedure: Lower Extremity Intervention;  Surgeon: Annice Needy, MD;  Location: [facility] INVASIVE  CV LAB;  Service: Cardiovascular;  Laterality: Right;   PERIPHERAL VASCULAR CATHETERIZATION Right 12/03/2015   Procedure: Lower Extremity Angiography;  Surgeon: Annice Needy, MD;  Location: [facility] INVASIVE CV LAB;  Service: Cardiovascular;  Laterality: Right;   PERIPHERAL VASCULAR CATHETERIZATION Right 12/04/2015   Procedure: Lower Extremity Angiography;  Surgeon: Annice Needy, MD;  Location: [facility] INVASIVE CV LAB;  Service: Cardiovascular;  Laterality: Right;   PERIPHERAL VASCULAR CATHETERIZATION  12/04/2015   Procedure: Lower Extremity Intervention;  Surgeon: Annice Needy, MD;  Location: [facility] INVASIVE CV LAB;  Service: Cardiovascular;;    PERIPHERAL VASCULAR CATHETERIZATION Right 06/30/2016   Procedure: Lower Extremity Angiography;  Surgeon: Annice Needy, MD;  Location: [facility] INVASIVE CV LAB;  Service: Cardiovascular;  Laterality: Right;   PERIPHERAL VASCULAR CATHETERIZATION Right 06/25/2016   Procedure: Lower Extremity Angiography;  Surgeon: Renford Dills, MD;  Location: [facility] INVASIVE CV LAB;  Service: Cardiovascular;  Laterality: Right;   PERIPHERAL VASCULAR CATHETERIZATION Right 07/01/2016   Procedure: Lower Extremity Angiography;  Surgeon: Renford Dills, MD;  Location: [facility] INVASIVE CV LAB;  Service: Cardiovascular;  Laterality: Right;   PERIPHERAL VASCULAR CATHETERIZATION  07/01/2016   Procedure: Lower Extremity Intervention;  Surgeon: Renford Dills, MD;  Location: [facility] INVASIVE CV LAB;  Service: Cardiovascular;;   PERIPHERAL VASCULAR CATHETERIZATION Right 08/01/2016   Procedure: Lower Extremity Angiography;  Surgeon: Renford Dills, MD;  Location: [facility] INVASIVE CV LAB;  Service: Cardiovascular;  Laterality: Right;   PORTA CATH INSERTION N/A 10/05/2020   Procedure: PORTA CATH INSERTION;  Surgeon: Renford Dills, MD;  Location: [facility] INVASIVE CV LAB;  Service: Cardiovascular;  Laterality: N/A;   stent placement in right leg Right    VIDEO BRONCHOSCOPY N/A 09/14/2020   Procedure: VIDEO BRONCHOSCOPY WITH FLUORO;  Surgeon: Salena Saner, MD;  Location: [facility] ORS;  Service: Cardiopulmonary;  Laterality: N/A;   VIDEO BRONCHOSCOPY WITH ENDOBRONCHIAL ULTRASOUND N/A 09/14/2020   Procedure: VIDEO BRONCHOSCOPY WITH ENDOBRONCHIAL ULTRASOUND;  Surgeon: Salena Saner, MD;  Location: [facility] ORS;  Service: Cardiopulmonary;  Laterality: N/A;    SOCIAL HISTORY: Social History   Socioeconomic History   Marital status: Single    Spouse name: Not on file   Number of children: Not on file   Years of education: Not on file   Highest education level: Not on file  Occupational History   Occupation: unemployed  Tobacco Use    Smoking status: Former    Current packs/day: 0.00    Average packs/day: 2.0 packs/day for 38.0 years (76.0 ttl pk-yrs)    Types: Cigarettes    Start date: 12/19/1976    Quit date: 12/20/2014    Years since quitting: 8.3   Smokeless tobacco: Never   Tobacco comments:    quit smoking in 2017 most smoked 2   Vaping Use   Vaping status: Former  Substance and Sexual Activity   Alcohol use: Not Currently   Drug use: No   Sexual activity: Yes  Other Topics Concern   Not on file  Social History Narrative   Not on file   Social Determinants of Health   Financial Resource Strain: Low Risk  (12/29/2022)   Received from [facility], [facility]   Overall Financial Resource Strain (CARDIA)    Difficulty of Paying Living Expenses: Not hard at all  Food Insecurity: No Food Insecurity (12/29/2022)   Received from [facility], [facility]   Hunger Vital Sign  Worried About Programme researcher, broadcasting/film/video in the Last Year: Never true    Ran Out of Food in the Last Year: Never true  Transportation Needs: No Transportation Needs (12/29/2022)   Received from [facility], [facility]   PRAPARE - Transportation    In the past 12 months, has lack of transportation kept you from medical appointments or from getting medications?: No    Lack of Transportation (Non-Medical): No  Physical Activity: Not on file  Stress: Not on file  Social Connections: Not on file  Intimate Partner Violence: Not on file    FAMILY HISTORY: Family History  Problem Relation Age of Onset   Diabetes Mother    Hypertension Mother    Heart murmur Mother    Leukemia Mother    Throat cancer Maternal Grandmother    Colon cancer Neg Hx    Breast cancer Neg Hx     ALLERGIES:  has No Known Allergies.  MEDICATIONS:  Current Outpatient Medications  Medication Sig Dispense Refill   aspirin EC 81 MG tablet Take 81 mg by mouth  daily. Swallow whole.     clopidogrel (PLAVIX) 75 MG tablet TAKE 1 TABLET BY MOUTH DAILY 100 tablet 2   levothyroxine (SYNTHROID) 100 MCG tablet Take 1 tablet (100 mcg total) by mouth daily before breakfast. 90 tablet 0   levothyroxine (SYNTHROID) 50 MCG tablet Take 0.5 tablets (25 mcg total) by mouth daily before breakfast. Take with his levothyroxine. 45 tablet 0   potassium chloride (KLOR-CON M) 10 MEQ tablet Take 1 tablet (10 mEq total) by mouth 2 (two) times daily. 30 tablet 1   simvastatin (ZOCOR) 40 MG tablet Take 40 mg by mouth at bedtime.     No current facility-administered medications for this visit.   Facility-Administered Medications Ordered in Other Visits  Medication Dose Route Frequency Provider Last Rate Last Admin   0.9 %  sodium chloride infusion   Intravenous Once [name], MD       albuterol (PROVENTIL) (2.5 MG/3ML) 0.083% nebulizer solution 2.5 mg  2.5 mg Nebulization Once Salena Saner, MD       durvalumab (IMFINZI) 1,000 mg in sodium chloride 0.9 % 100 mL chemo infusion  10 mg/kg (Treatment Plan Recorded) Intravenous Once [name], MD         PHYSICAL EXAMINATION: ECOG PERFORMANCE STATUS: 1 - Symptomatic but completely ambulatory Vitals:   04/30/23 1341  BP: (!) 152/82  Pulse: 98  Resp: 18  Temp: 99.3 F (37.4 C)  SpO2: 96%   Filed Weights   04/30/23 1341  Weight: 216 lb 4.8 oz (98.1 kg)    Physical Exam Constitutional:      General: He is not in acute distress.    Appearance: He is obese. He is not diaphoretic.     Comments: Patient walks with a cane  HENT:     Head: Normocephalic and atraumatic.  Eyes:     General: No scleral icterus. Cardiovascular:     Rate and Rhythm: Normal rate and regular rhythm.     Heart sounds: No murmur heard. Pulmonary:     Effort: Pulmonary effort is normal. No respiratory distress.  Abdominal:     General: There is no distension.     Palpations: Abdomen is soft.     Tenderness: There is no abdominal  tenderness.  Musculoskeletal:     Cervical back: Normal range of motion and neck supple.  Skin:    General: Skin  is warm and dry.     Findings: No erythema.  Neurological:     Mental Status: He is alert and oriented to person, place, and time. Mental status is at baseline.     Cranial Nerves: No cranial nerve deficit.     Motor: No abnormal muscle tone.  Psychiatric:        Mood and Affect: Mood and affect normal.      LABORATORY DATA:  I have reviewed the data as listed    Latest Ref Rng & Units 04/30/2023   12:48 PM 04/16/2023    1:02 PM 04/02/2023    2:01 PM  CBC  WBC 4.0 - 10.5 K/uL 5.7  4.9  4.8   Hemoglobin 13.0 - 17.0 g/dL 11.9  14.7  82.9   Hematocrit 39.0 - 52.0 % 39.5  39.6  42.0   Platelets 150 - 400 K/uL 242  213  260       Latest Ref Rng & Units 04/30/2023   12:48 PM 04/16/2023    1:02 PM 04/02/2023    2:01 PM  CMP  Glucose 70 - 99 mg/dL 562  130  865   BUN 8 - 23 mg/dL 17  14  9    Creatinine 0.61 - 1.24 mg/dL 7.84  6.96  2.95   Sodium 135 - 145 mmol/L 138  138  138   Potassium 3.5 - 5.1 mmol/L 3.2  3.4  3.7   Chloride 98 - 111 mmol/L 103  107  104   CO2 22 - 32 mmol/L 23  24  25    Calcium 8.9 - 10.3 mg/dL 9.1  8.6  9.1   Total Protein 6.5 - 8.1 g/dL 7.0  6.8  7.0   Total Bilirubin 0.3 - 1.2 mg/dL 0.6  0.6  0.4   Alkaline Phos 38 - 126 U/L 82  82  80   AST 15 - 41 U/L 28  21  24    ALT 0 - 44 U/L 18  15  17       RADIOGRAPHIC STUDIES: I have personally reviewed the radiological images as listed and agreed with the findings in the report. 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 * Tracking Code: BO * EXAM: CT CHEST, ABDOMEN, AND PELVIS WITH CONTRAST TECHNIQUE: Multidetector CT imaging of the chest, abdomen and pelvis was performed following the standard protocol during bolus administration of intravenous contrast. RADIATION DOSE REDUCTION: This  exam was performed according to the departmental dose-optimization program which includes automated exposure control, adjustment of the mA and/or kV according to patient size and/or use of iterative reconstruction technique. CONTRAST:  OMNIPAQUE IOHEXOL 350 MG/ML SOLN COMPARISON:  11/25/2022 FINDINGS: CT CHEST FINDINGS Cardiovascular: Aortic atherosclerosis. Right Port-A-Cath tip high right atrium. Normal heart size, without pericardial effusion. Lad and right coronary artery calcification. No central pulmonary embolism, on this non-dedicated study. Mediastinum/Nodes: No supraclavicular adenopathy. No mediastinal or hilar adenopathy. Lungs/Pleura: trace right pleural fluid or thickening is new or increased. Patent airways. Mild centrilobular emphysema. Similar appearance of central right upper lobe and perihilar consolidation with volume loss and traction bronchiectasis, consistent with radiation fibrosis. No local recurrence. The interstitial opacities including within the left lower lobe have resolved. Musculoskeletal: No acute osseous abnormality. CT ABDOMEN PELVIS FINDINGS Hepatobiliary: Mild hepatic steatosis without focal liver lesion or biliary abnormality. Pancreas: Normal, without mass or ductal dilatation. Spleen: Normal in size, without focal  abnormality. Adrenals/Urinary Tract: Normal adrenal glands. Normal kidneys, without hydronephrosis. Normal urinary bladder. Stomach/Bowel: Normal stomach, without wall thickening. Extensive colonic diverticulosis. Normal terminal ileum and appendix. Normal small bowel. Vascular/Lymphatic: Aortic atherosclerosis. No abdominopelvic adenopathy. Right superficial femoral artery grafts are incompletely imaged but again appear occluded. Reproductive: Normal prostate.  Small bilateral hydroceles. Other: No significant free fluid. No evidence of omental or peritoneal disease. Musculoskeletal: Partial degenerative fusion of the bilateral sacroiliac joints. 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 IMPRESSION: 1. 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 2. Trace right pleural fluid or thickening, new or minimally increased. 3. Incidental findings, including: Aortic atherosclerosis (ICD10-I70.0), coronary artery atherosclerosis and emphysema (ICD10-J43.9). Hepatic steatosis. Electronically Signed   By: Jeronimo Greaves M.D.   On: 03/16/2023 14:43   MR BRAIN W WO CONTRAST  Result Date: 02/14/2023 CLINICAL DATA:  Brain/CNS neoplasm, assess treatment response. EXAM: MRI HEAD WITHOUT AND WITH CONTRAST TECHNIQUE: Multiplanar, multiecho pulse sequences of the brain and surrounding structures were obtained without and with intravenous contrast. CONTRAST:  10mL GADAVIST GADOBUTROL 1 MMOL/ML IV SOLN COMPARISON:  MRI head November 05, 2022. FINDINGS: Brain: Interval decrease in multiple enhancing lesions. Posterior left temporal lobe lesion now measures 18 mm (previously 21 mm). High left parietal lobe lesion now measures 11 mm (previously 14 mm). Right occipital lobe lesion measures 11 mm (previously 17 mm). Left cerebellar lesion is now punctate (previously 7 mm). No new enhancing lesions identified. Edema surrounding lesions is also decreased. No progressive mass effect. No midline shift. No evidence of acute infarct, mass occupying acute hemorrhage, or hydrocephalus. Vascular: Major arterial flow voids are maintained at the skull base. Skull and upper cervical spine: Normal marrow signal. Sinuses/Orbits: Clear sinuses.  No acute orbital findings. Other: No mastoid effusions. IMPRESSION: Interval decrease in all of the metastatic lesions, detailed above. No new lesions identified. Electronically Signed   By: Feliberto Harts M.D.   On: 02/14/2023 14:52

## 2023-04-30 NOTE — Assessment & Plan Note (Signed)
Recommend potassium KCL daily

## 2023-04-30 NOTE — Assessment & Plan Note (Signed)
Durvalumab treatment as planned.  

## 2023-04-30 NOTE — Patient Instructions (Signed)
[name]  Discharge Instructions: Thank you for choosing [facility] to provide your oncology and hematology care.  If you have a lab appointment with the Cancer Center, please go directly to the Cancer Center and check in at the registration area.  Wear comfortable clothing and clothing appropriate for easy access to any Portacath or PICC line.   We strive to give you quality time with your provider. You may need to reschedule your appointment if you arrive late (15 or more minutes).  Arriving late affects you and other patients whose appointments are after yours.  Also, if you miss three or more appointments without notifying the office, you may be dismissed from the clinic at the provider's discretion.      For prescription refill requests, have your pharmacy contact our office and allow 72 hours for refills to be completed.     To help prevent nausea and vomiting after your treatment, we encourage you to take your nausea medication as directed.  BELOW ARE SYMPTOMS THAT SHOULD BE REPORTED IMMEDIATELY: *FEVER GREATER THAN 100.4 F (38 C) OR HIGHER *CHILLS OR SWEATING *NAUSEA AND VOMITING THAT IS NOT CONTROLLED WITH YOUR NAUSEA MEDICATION *UNUSUAL SHORTNESS OF BREATH *UNUSUAL BRUISING OR BLEEDING *URINARY PROBLEMS (pain or burning when urinating, or frequent urination) *BOWEL PROBLEMS (unusual diarrhea, constipation, pain near the anus) TENDERNESS IN MOUTH AND THROAT WITH OR WITHOUT PRESENCE OF ULCERS (sore throat, sores in mouth, or a toothache) UNUSUAL RASH, SWELLING OR PAIN  UNUSUAL VAGINAL DISCHARGE OR ITCHING   Items with * indicate a potential emergency and should be followed up as soon as possible or go to the Emergency Department if any problems should occur.  Please show the CHEMOTHERAPY ALERT CARD or IMMUNOTHERAPY ALERT CARD at check-in to the Emergency Department and triage nurse.  Should you have questions after your visit  or need to cancel or reschedule your appointment, please contact [facility]  [phone] and follow the prompts.  Office hours are 8:00 a.m. to 4:30 p.m. Monday - Friday. Please note that voicemails left after 4:00 p.m. may not be returned until the following business day.  We are closed weekends and major holidays. You have access to a nurse at all times for urgent questions. Please call the main number to the clinic [phone] and follow the prompts.  For any non-urgent questions, you may also contact your provider using MyChart. We now offer e-Visits for anyone 18 and older to request care online for non-urgent symptoms. For details visit [email or web address].   Also download the MyChart app! Go to the app store, search "MyChart", open the app, select [facility], and log in with your MyChart username and password.    

## 2023-04-30 NOTE — Assessment & Plan Note (Signed)
Status post radiation,  02/19/22 MRI showed decreased brain lesion size and edema.  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 Mitzi Hansen.  MRI on 02/05/23 stable improved -  04/16/23 MRI brain pending official reading.  Follow up with Dr. Barbaraann Cao.

## 2023-04-30 NOTE — Assessment & Plan Note (Addendum)
normal TSH with normal T4.  continue levothyroxine 125 mcg daily

## 2023-05-09 ENCOUNTER — Other Ambulatory Visit (INDEPENDENT_AMBULATORY_CARE_PROVIDER_SITE_OTHER): Payer: Self-pay | Admitting: Nurse Practitioner

## 2023-05-11 ENCOUNTER — Inpatient Hospital Stay: Payer: Medicare Other

## 2023-05-12 ENCOUNTER — Other Ambulatory Visit: Payer: Medicare Other

## 2023-05-14 ENCOUNTER — Encounter: Payer: Self-pay | Admitting: Oncology

## 2023-05-14 ENCOUNTER — Inpatient Hospital Stay: Payer: Medicare Other

## 2023-05-14 ENCOUNTER — Inpatient Hospital Stay: Payer: Medicare Other | Admitting: Oncology

## 2023-05-14 VITALS — BP 128/70 | HR 88 | Temp 97.6°F | Resp 18 | Wt 218.3 lb

## 2023-05-14 DIAGNOSIS — C3491 Malignant neoplasm of unspecified part of right bronchus or lung: Secondary | ICD-10-CM

## 2023-05-14 DIAGNOSIS — Z5112 Encounter for antineoplastic immunotherapy: Secondary | ICD-10-CM

## 2023-05-14 DIAGNOSIS — E032 Hypothyroidism due to medicaments and other exogenous substances: Secondary | ICD-10-CM | POA: Diagnosis not present

## 2023-05-14 DIAGNOSIS — C7931 Secondary malignant neoplasm of brain: Secondary | ICD-10-CM | POA: Diagnosis not present

## 2023-05-14 DIAGNOSIS — E876 Hypokalemia: Secondary | ICD-10-CM

## 2023-05-14 LAB — COMPREHENSIVE METABOLIC PANEL
ALT: 20 U/L (ref 0–44)
AST: 22 U/L (ref 15–41)
Albumin: 3.6 g/dL (ref 3.5–5.0)
Alkaline Phosphatase: 86 U/L (ref 38–126)
Anion gap: 8 (ref 5–15)
BUN: 13 mg/dL (ref 8–23)
CO2: 26 mmol/L (ref 22–32)
Calcium: 8.8 mg/dL — ABNORMAL LOW (ref 8.9–10.3)
Chloride: 107 mmol/L (ref 98–111)
Creatinine, Ser: 1.01 mg/dL (ref 0.61–1.24)
GFR, Estimated: >60 mL/min (ref >60)
Glucose, Bld: 113 mg/dL — ABNORMAL HIGH (ref 70–99)
Potassium: 3.3 mmol/L — ABNORMAL LOW (ref 3.5–5.1)
Sodium: 141 mmol/L (ref 135–145)
Total Bilirubin: 0.3 mg/dL (ref 0.3–1.2)
Total Protein: 6.5 g/dL (ref 6.5–8.1)

## 2023-05-14 LAB — CBC WITH DIFFERENTIAL/PLATELET
Abs Immature Granulocytes: 0.02 10*3/uL (ref 0.00–0.07)
Basophils Absolute: 0.1 10*3/uL (ref 0.0–0.1)
Basophils Relative: 1 %
Eosinophils Absolute: 0.3 10*3/uL (ref 0.0–0.5)
Eosinophils Relative: 5 %
HCT: 38.7 % — ABNORMAL LOW (ref 39.0–52.0)
Hemoglobin: 12.5 g/dL — ABNORMAL LOW (ref 13.0–17.0)
Immature Granulocytes: 0 %
Lymphocytes Relative: 15 %
Lymphs Abs: 0.9 10*3/uL (ref 0.7–4.0)
MCH: 26.7 pg (ref 26.0–34.0)
MCHC: 32.3 g/dL (ref 30.0–36.0)
MCV: 82.7 fL (ref 80.0–100.0)
Monocytes Absolute: 0.8 10*3/uL (ref 0.1–1.0)
Monocytes Relative: 12 %
Neutro Abs: 4.1 10*3/uL (ref 1.7–7.7)
Neutrophils Relative %: 67 %
Platelets: 209 10*3/uL (ref 150–400)
RBC: 4.68 MIL/uL (ref 4.22–5.81)
RDW: 13.7 % (ref 11.5–15.5)
WBC: 6.2 10*3/uL (ref 4.0–10.5)
nRBC: 0 % (ref 0.0–0.2)

## 2023-05-14 MED ORDER — HEPARIN SOD (PORK) LOCK FLUSH 100 UNIT/ML IV SOLN
500.0000 [IU] | Freq: Once | INTRAVENOUS | Status: AC | PRN
  Administered 2023-05-14: 500 [IU]
  Filled 2023-05-14: qty 5

## 2023-05-14 MED ORDER — SODIUM CHLORIDE 0.9 % IV SOLN
Freq: Once | INTRAVENOUS | Status: AC
  Filled 2023-05-14: qty 250

## 2023-05-14 MED ORDER — SODIUM CHLORIDE 0.9 % IV SOLN
10.0000 mg/kg | Freq: Once | INTRAVENOUS | Status: AC
  Administered 2023-05-14: 1000 mg via INTRAVENOUS
  Filled 2023-05-14: qty 20

## 2023-05-14 NOTE — Assessment & Plan Note (Addendum)
Recommend potassium KCL BID

## 2023-05-14 NOTE — Patient Instructions (Signed)
[name]  Discharge Instructions: Thank you for choosing [facility] to provide your oncology and hematology care.  If you have a lab appointment with the Cancer Center, please go directly to the Cancer Center and check in at the registration area.  Wear comfortable clothing and clothing appropriate for easy access to any Portacath or PICC line.   We strive to give you quality time with your provider. You may need to reschedule your appointment if you arrive late (15 or more minutes).  Arriving late affects you and other patients whose appointments are after yours.  Also, if you miss three or more appointments without notifying the office, you may be dismissed from the clinic at the provider's discretion.      For prescription refill requests, have your pharmacy contact our office and allow 72 hours for refills to be completed.     To help prevent nausea and vomiting after your treatment, we encourage you to take your nausea medication as directed.  BELOW ARE SYMPTOMS THAT SHOULD BE REPORTED IMMEDIATELY: *FEVER GREATER THAN 100.4 F (38 C) OR HIGHER *CHILLS OR SWEATING *NAUSEA AND VOMITING THAT IS NOT CONTROLLED WITH YOUR NAUSEA MEDICATION *UNUSUAL SHORTNESS OF BREATH *UNUSUAL BRUISING OR BLEEDING *URINARY PROBLEMS (pain or burning when urinating, or frequent urination) *BOWEL PROBLEMS (unusual diarrhea, constipation, pain near the anus) TENDERNESS IN MOUTH AND THROAT WITH OR WITHOUT PRESENCE OF ULCERS (sore throat, sores in mouth, or a toothache) UNUSUAL RASH, SWELLING OR PAIN  UNUSUAL VAGINAL DISCHARGE OR ITCHING   Items with * indicate a potential emergency and should be followed up as soon as possible or go to the Emergency Department if any problems should occur.  Please show the CHEMOTHERAPY ALERT CARD or IMMUNOTHERAPY ALERT CARD at check-in to the Emergency Department and triage nurse.  Should you have questions after your visit  or need to cancel or reschedule your appointment, please contact [facility]  [phone] and follow the prompts.  Office hours are 8:00 a.m. to 4:30 p.m. Monday - Friday. Please note that voicemails left after 4:00 p.m. may not be returned until the following business day.  We are closed weekends and major holidays. You have access to a nurse at all times for urgent questions. Please call the main number to the clinic [phone] and follow the prompts.  For any non-urgent questions, you may also contact your provider using MyChart. We now offer e-Visits for anyone 18 and older to request care online for non-urgent symptoms. For details visit [email or web address].   Also download the MyChart app! Go to the app store, search "MyChart", open the app, select [facility], and log in with your MyChart username and password.    

## 2023-05-14 NOTE — Assessment & Plan Note (Addendum)
continue levothyroxine 125 mcg daily

## 2023-05-14 NOTE — Assessment & Plan Note (Signed)
Durvalumab treatment as planned.  

## 2023-05-14 NOTE — Progress Notes (Signed)
Hematology/Oncology Progress note [phone] [phone]    CHIEF COMPLAINTS/REASON FOR VISIT:  Follow up for lung cancer   ASSESSMENT & PLAN:   Cancer Staging  Squamous cell carcinoma of right lung (HCC) Staging form: Lung, AJCC 8th Edition - Clinical stage from 09/20/2020: Stage IV (cT1b, cN2, cM1) - Signed by [name], MD on 12/13/2021   Squamous cell carcinoma of right lung (HCC) Durvalumab was previously held due to pneumonitis.  May 2024 CT scan showed Improving bilateral scattered interstitial and ground-glass opacities. Discussed with pulmonology Dr. Jayme Cloud. 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  Labs are reviewed and discussed with patient. Proceed with Durvalumab.    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   Malignant neoplasm metastatic to brain [facility]) Status post radiation,  02/19/22 MRI showed decreased brain lesion size and edema.  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 Mitzi Hansen.  Follow up with Dr. Barbaraann Cao.    Hypokalemia Recommend potassium KCL BID    No orders of the defined types were placed in this encounter.   Follow up 2 weeks lab MD Durvalumab All questions were answered. The patient knows to call the clinic with any problems, questions or concerns.  [name], MD, PhD [facility] Hematology Oncology 05/14/2023       HISTORY OF PRESENTING ILLNESS:  Joshua [name] is a 67 y.o. male who has above history reviewed by me today  presents for follow up visit for Stage IV lung squamous cell carcinoma. Oncology History  Squamous cell carcinoma of right lung (HCC)  09/18/2020 Imaging   PET scan showed right upper lobe nodule maximal SUV 11.5.  Hypermetabolic cluster right paratracheal adenopathy with maximum SUV up to 12.  No findings of metastatic disease to the neck/abdomen/pelvis or skeleton.   09/20/2020 Initial Diagnosis   Squamous cell carcinoma of right lung (HCC) NGS showed PD-L1 TPS 95%, LRP1B S928fs, PIK3CA E545K, RB1 c1422-2A>T, RIT1 T83_ A84del, STK11 P260fs, TPS53 R248W, TMB 19.44mut/mb, MS stable.    09/20/2020 Cancer Staging   Staging form: Lung, AJCC 8th Edition - Clinical stage from 09/20/2020: Stage IV (cT1b, cN2, cM1) - Signed by [name], MD on 12/13/2021   10/03/2020 Imaging   MRI brain with and without contrast showed no evidence of intracranial metastatic disease. Well-defined round lesion within the left parotid tail measuring approximately 2.3 x 1.8 x 1.8 cm   10/09/2020 - 11/27/2020 Radiation Therapy   concurrent chemoradiation   10/10/2020 - 10/31/2020 Chemotherapy   10/10/2020 concurrent chemoradiation.  carboplatin AUC of 2 and Taxol 45 mg/m2 x 4.  Additional chemotherapy was held due to thrombocytopenia, neutropenia       12/31/2020 - 03/21/2022 Chemotherapy   Maintenance  Durvalumab q14d      12/31/2020 -  Chemotherapy   Patient is on Treatment Plan : LUNG Durvalumab (10) q14d     03/28/2021 Imaging   CT without contrast showed new linear right perihilar consolidation with associated traction bronchiectasis.  Likely postradiation changes.  Additional new peribronchial vascular/nodular groundglass opacities with areas of consolidation or seen primarily in the right upper and  lower lobes, more distant from expected radiation fields. 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  Per my discussion with  pulmonology Dr. 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     05/09/2021 Imaging   Repeat CT chest without contrast showed evolving radiation changes in the right perihilar region likely accounting for interval increase to thickening of the posterior wall of the right upper lobe bronchus.  The more peripheral right lung groundglass opacities seen on most recent study have generally improved with exception of one peripherally right upper lobe which appears new.  These are likely inflammatory.  No findings highly suspicious for local recurrence.   05/30/2021 - 01/10/2022 Chemotherapy   Durvalumab q14d     10/07/2021 Imaging   MRI brain with and without contrast showed 2 contrast-enhancing lesions, most consistent with metastatic disease.  Largest lesion is in the left frontal lobe and measures up to 1.3 cm with a large amount of surrounding edema and 9 mm rightward midline shift.  The other enhancing lesion was 4 mm in the right occipital lobe with a small amount of surrounding edema.  No acute or chronic hemorrhage.   10/07/2021 Progression   Headache after colonoscopy, progressive difficulty using her right arm.  Progressively worsening of speech. Stage IV lung cancer with brain metastasis.  started on Dexamethasone.  He was seen by neurosurgery and radiation oncology.  Recommendation is to proceed with SRS.  Patient was discharged with dexamethasone   10/16/2021 Imaging   PET scan showed postradiation changes in the right supra hilar lung without evidence of local recurrence.  No evidence of lung cancer recurrence or metastatic disease.  Single focus of intense metabolic activity associated with posterior left 11th rib likely secondary to recent fall/posttraumatic metabolic changes.  Frontal  uptake in the left frontal lobe corresponding to the known metastatic lesion in the brain   10/29/2021 -  Radiation Therapy   Brain Radiation [facility]   12/10/2021 Imaging   Brain MRI:  Continued interval decrease in size of a left frontal lobe metastasis, now measuring 10 mm. Moderate surrounding edema has also decreased.  3-4 mm metastasis within the right occipital lobe, not significantly changed in size. There is now a small focus of central non enhancement within this lesion. Increased edema. 3 mm enhancing metastasis within the high left parietal lobe. In retrospect, this was likely present as a subtle punctate focus of enhancement on the prior brain MRI of 10/22/2021.   01/18/2022 Imaging   CT chest abdomen pelvis w contrast 1. Stable post treatment changes in the right upper lobe medially with dense radiation fibrosis but no findings to suggest local recurrent tumor. 2. Significant progression of ill-defined ground-glass opacity, interstitial thickening and airspace nodularity in the right upper lobe and upper aspect of the right lower lobe. Findings could reflect an inflammatory or atypical infectious process, drug induced pneumonitis or interstitial spread of tumor.3. No mediastinal or hilar mass or adenopathy. 4. No findings for abdominal/pelvic metastatic disease or osseous metastatic disease.   02/19/2022 Imaging   MRI brain w wo contrast showed Three metastatic deposits in the brain appear smaller. Decreased edema in the left parietal lobe. No new lesions    04/04/2022 Imaging   CT chest w contrast 1. Stable radiation changes in the right perihilar region without evidence of local recurrence. 2. Interval improvement in the patchy ground-glass opacities previously demonstrated in both lungs,  most consistent with a resolving infectious or inflammatory process (including immunotherapy-related pneumonitis). 3. No evidence of metastatic disease.   05/14/2022 Miscellaneous   He had a mechanical fall and fractured his left elbow. xrays showed a left elbow fracture  05/26/22 seen by orthopedic surgeon, cast applied.    05/27/2022 Imaging   MRI brain w wo contrast 1. All  three known brain metastases have enlarged since August, with increasing regional edema. And there is a small new left anterior cerebellar 4-5 mm metastasis (was questionably punctate on the May MRI). This constellation could reflect a combination of True Progression (Left Cerebellar lesion) and pseudoprogression/radionecrosis (3 existing mets).    2. No significant intracranial mass effect. 3. Underlying moderate cerebral white matter signal changes.   05/30/2022 Imaging   CT chest abdomen pelvis w contrast  1. 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 There is a stable small right pleural effusion, but no definitive findings to suggest residual or recurrent disease on today's examination. 2. No signs of metastatic disease in the abdomen or pelvis.  3. Aortic atherosclerosis, in addition to two-vessel coronary artery disease. Please note that although the presence of coronary artery calcium documents the presence of coronary artery disease, the severity of this disease and any potential stenosis cannot be assessed on this non-gated CT examination. Assessment for potential risk factor modification, dietary therapy or pharmacologic therapy may be warranted, if clinically indicated. 4. Hepatic steatosis. 5. Colonic diverticulosis without evidence of acute diverticulitis at this time. 6. Incompletely imaged but apparently occluded vascular graft in the superior aspect of the right anterior thigh. Referral to vascular specialist for further clinical evaluation should be considered if clinically appropriate. 7. Additional incidental findings, as above.   07/22/2022 Imaging   MRI brain  1. No new or enlarging lesions. No intracranial disease progression. 2. Decreased size of treated lesions of the left frontoparietal junction and left postcentral gyrus. 3. Unchanged lesions in the  left cerebellum and right occipital lobe.   10/15/2022 Imaging   MRI brain w wo contrast showed Interval increase in size of multiple intraparenchymal lesions with worsened surrounding vasogenic edema. 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 No new lesions   11/05/2022 Imaging   MRI brain w wo contrast  1. Likely slight increase and right occipital and left cerebellar lesions. Otherwise, unchanged metastases, as above. No new lesions identified. 2. Partially imaged large probable lymph node in the left upper neck, apparent on prior PET CT.   11/13/2022 -  Radiation Therapy   Each of the sites were treated with SRS/SBRT/SRT-IMRT technique to 20 Gy in 1 fraction to each of the following sites: PTV_2_LCerebel_80mm PTV_3_RCerebel_65mm PTV_4_ROccip_4mm PTV_5_LParietal_34mm   11/27/2022 Imaging   CT chest abdomen pelvis w contrast showed  Improving bilateral scattered interstitial and ground-glass opacities. Small amount of residual areas in the extreme left lower lobe inferiorly. This specific area is slightly increased from previous but overall the amount of opacities is significantly improved. Recommend continued follow-up. No developing new mass lesion, fluid collection or lymph node enlargement. Stable posttreatment changes right perihilar in the lung. Fatty liver infiltration. Colonic diverticulosis. Improved tiny right pleural effusion   02/05/2023 Imaging   MRI brain with and without contrast Interval decrease in all of the metastatic lesions, detailed above.No new lesions identified.    Imaging   CT chest abdomen pelvis w contrast showed  1. 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 2. Trace right pleural fluid or thickening, new or minimally increased. 3. Incidental findings, including: Aortic atherosclerosis (ICD10-I70.0), coronary artery  atherosclerosis and emphysema (ICD10-J43.9). Hepatic steatosis.    Today he feels well.Intermittent headache, is better. denies any focal deficits. chronic SOB,not worse.     Review of Systems  Constitutional:  Positive for fatigue. Negative for appetite change, chills, fever and unexpected weight change.  HENT:   Negative for hearing loss and voice change.   Eyes:  Negative for eye problems and icterus.  Respiratory:  Negative for chest tightness, cough and shortness of breath.   Cardiovascular:  Negative for chest pain and leg swelling.  Gastrointestinal:  Negative for abdominal distention and abdominal pain.  Endocrine: Negative for hot flashes.  Genitourinary:  Negative for difficulty urinating, dysuria and frequency.   Musculoskeletal:  Negative for arthralgias.  Skin:  Negative for itching and rash.  Neurological:  Positive for extremity weakness. Negative for headaches, light-headedness, numbness and speech difficulty.       + tongue was thick and right face numbness.  Hematological:  Negative for adenopathy. Does not bruise/bleed easily.  Psychiatric/Behavioral:  Negative for confusion.       MEDICAL HISTORY:  Past Medical History:  Diagnosis Date   Allergy    Cancer (HCC)    lung   COPD (chronic obstructive pulmonary disease) (HCC)    Dyspnea    former smoker. only doe   History of kidney stones    Hypertension    Peripheral vascular disease (HCC)    Pre-diabetes    Sleep apnea     SURGICAL HISTORY: Past Surgical History:  Procedure Laterality Date   ANTERIOR CERVICAL DECOMP/DISCECTOMY FUSION N/A 03/14/2020   Procedure: ANTERIOR CERVICAL DECOMPRESSION/DISCECTOMY FUSION 3 LEVELS C4-7;  Surgeon: Venetia Night, MD;  Location: [facility] ORS;  Service: Neurosurgery;  Laterality: N/A;   BACK SURGERY  2011   CENTRAL LINE INSERTION Right 09/10/2016   Procedure: CENTRAL LINE INSERTION;  Surgeon: Renford Dills, MD;  Location: [facility] ORS;  Service: Vascular;   Laterality: Right;   CERVICAL FUSION     C4-c7 on Mar 14, 2020   COLONOSCOPY  2013 ?   COLONOSCOPY N/A 09/09/2021   Procedure: COLONOSCOPY;  Surgeon: Jaynie Collins, DO;  Location: [facility] ENDOSCOPY;  Service: Gastroenterology;  Laterality: N/A;   CORONARY ANGIOPLASTY     ESOPHAGOGASTRODUODENOSCOPY N/A 09/09/2021   Procedure: ESOPHAGOGASTRODUODENOSCOPY (EGD);  Surgeon: Jaynie Collins, DO;  Location: [facility] ENDOSCOPY;  Service: Gastroenterology;  Laterality: N/A;   FEMORAL-POPLITEAL BYPASS GRAFT Right 09/10/2016   Procedure: BYPASS GRAFT FEMORAL-POPLITEAL ARTERY ( BELOW KNEE );  Surgeon: Renford Dills, MD;  Location: [facility] ORS;  Service: Vascular;  Laterality: Right;   FEMORAL-TIBIAL BYPASS GRAFT Right 03/23/2020   Procedure: BYPASS GRAFT RIGHT FEMORAL- DISTAL TIBIAL ARTERY WITH DISTAL FLOW GRAFT;  Surgeon: Renford Dills, MD;  Location: [facility] ORS;  Service: Vascular;  Laterality: Right;   LEFT HEART CATH AND CORONARY ANGIOGRAPHY Left 05/16/2021   Procedure: LEFT HEART CATH AND CORONARY ANGIOGRAPHY;  Surgeon: Armando Reichert, MD;  Location: [facility] INVASIVE CV LAB;  Service: Cardiovascular;  Laterality: Left;   LOWER EXTREMITY ANGIOGRAPHY Right 11/18/2016   Procedure: Lower Extremity Angiography;  Surgeon: Renford Dills, MD;  Location: [facility] INVASIVE CV LAB;  Service: Cardiovascular;  Laterality: Right;   LOWER EXTREMITY ANGIOGRAPHY Right 12/09/2016   Procedure: Lower Extremity Angiography;  Surgeon: Renford Dills, MD;  Location: [facility] INVASIVE CV LAB;  Service: Cardiovascular;  Laterality: Right;   LOWER EXTREMITY ANGIOGRAPHY Right 11/15/2019   Procedure: LOWER EXTREMITY ANGIOGRAPHY;  Surgeon: Renford Dills, MD;  Location: [facility] INVASIVE CV LAB;  Service: Cardiovascular;  Laterality: Right;   LOWER EXTREMITY ANGIOGRAPHY Right 12/01/2019   Procedure: Lower Extremity Angiography;  Surgeon: Annice Needy, MD;  Location: [facility] INVASIVE CV LAB;  Service: Cardiovascular;  Laterality:  Right;   LOWER EXTREMITY ANGIOGRAPHY Right 12/02/2019   Procedure: LOWER EXTREMITY ANGIOGRAPHY;  Surgeon: Renford Dills, MD;  Location: [facility] INVASIVE CV LAB;  Service: Cardiovascular;  Laterality: Right;   LOWER EXTREMITY ANGIOGRAPHY Right 03/23/2020   Procedure: Lower Extremity Angiography;  Surgeon: Renford Dills, MD;  Location: [facility] INVASIVE CV LAB;  Service: Cardiovascular;  Laterality: Right;   LOWER EXTREMITY ANGIOGRAPHY Right 09/25/2021   Procedure: Lower Extremity Angiography;  Surgeon: Renford Dills, MD;  Location: [facility] INVASIVE CV LAB;  Service: Cardiovascular;  Laterality: Right;   LOWER EXTREMITY ANGIOGRAPHY Right 09/26/2021   Procedure: Lower Extremity Angiography;  Surgeon: Annice Needy, MD;  Location: [facility] INVASIVE CV LAB;  Service: Cardiovascular;  Laterality: Right;   LOWER EXTREMITY ANGIOGRAPHY Right 04/08/2022   Procedure: Lower Extremity Angiography;  Surgeon: Renford Dills, MD;  Location: [facility] INVASIVE CV LAB;  Service: Cardiovascular;  Laterality: Right;   LOWER EXTREMITY INTERVENTION  11/18/2016   Procedure: Lower Extremity Intervention;  Surgeon: Renford Dills, MD;  Location: [facility] INVASIVE CV LAB;  Service: Cardiovascular;;   PERIPHERAL VASCULAR CATHETERIZATION Right 01/02/2015   Procedure: Lower Extremity Angiography;  Surgeon: Renford Dills, MD;  Location: [facility] INVASIVE CV LAB;  Service: Cardiovascular;  Laterality: Right;   PERIPHERAL VASCULAR CATHETERIZATION Right 06/18/2015   Procedure: Lower Extremity Angiography;  Surgeon: Annice Needy, MD;  Location: [facility] INVASIVE CV LAB;  Service: Cardiovascular;  Laterality: Right;   PERIPHERAL VASCULAR CATHETERIZATION  06/18/2015   Procedure: Lower Extremity Intervention;  Surgeon: Annice Needy, MD;  Location: [facility] INVASIVE CV LAB;  Service: Cardiovascular;;   PERIPHERAL VASCULAR CATHETERIZATION N/A 10/09/2015   Procedure: Abdominal Aortogram w/Lower Extremity;  Surgeon: Renford Dills, MD;  Location: [facility]  INVASIVE CV LAB;  Service: Cardiovascular;  Laterality: N/A;   PERIPHERAL VASCULAR CATHETERIZATION  10/09/2015   Procedure: Lower Extremity Intervention;  Surgeon: Renford Dills, MD;  Location: [facility] INVASIVE CV LAB;  Service: Cardiovascular;;   PERIPHERAL VASCULAR CATHETERIZATION Right 10/10/2015   Procedure: Lower Extremity Angiography;  Surgeon: Annice Needy, MD;  Location: [facility] INVAS[facility] CV LAB;  Service: Cardiovascular;  Laterality: Right;   PERIPHERAL VASCULAR CATHETERIZATION Right 10/10/2015   Procedure: Lower Extremity Intervention;  Surgeon: Annice Needy, MD;  Location: [facility] INV[facility] LAB;  Service: Cardiovascular;  Laterality: Right;   PERIPHERAL VASCULAR CATHETERIZATION Right 12/03/2015   Procedure: Lower Extremity Angiography;  Surgeon: Annice Needy, MD;  Location: [facility] INV[facility];  Service: Cardiovascular;  Laterality: Right;   PERIPHERAL VASCULAR CATHETERIZATION Right 12/04/2015   Procedure: Lower Extremity Angiography;  Surgeon: Annice Needy, MD;  Location: [facility] INV[facility] CV LAB;  Service: Cardiovascular;  Laterality: Right;   PERIPHERAL VASCULAR CATHETERIZATION  12/04/2015   Procedure: Lower Extremity Intervention;  Surgeon: Annice Needy, MD;  Location: [facility] INV[facility] LAB;  Service: Cardiovascular;;   PERIPHERAL VASCULAR CATHETERIZATION Right 06/30/2016   Procedure: Lower Extremity Angiography;  Surgeon: Annice Needy, MD;  Location: [facility] INVAS[facility] CV [facility];  Service: Cardiovascular;  Laterality: Right;   PERIPHERAL VASCULAR CATHETERIZATION Right 06/25/2016  Procedure: Lower Extremity Angiography;  Surgeon: Renford Dills, MD;  Location: [facility] INV[facility];  Service: Cardiovascular;  Laterality: Right;   PERIPHERAL VASCULAR CATHETERIZATION Right 07/01/2016   Procedure: Lower Extremity Angiography;  Surgeon: Renford Dills, MD;  Location: [facility] INV[facility] CV [facility];  Service: Cardiovascular;  Laterality: Right;   PERIPHERAL VASCULAR CATHETERIZATION  07/01/2016   Procedure: Lower  Extremity Intervention;  Surgeon: Renford Dills, MD;  Location: [facility] INVAS[facility] CV LAB;  Service: Cardiovascular;;   PERIPHERAL VASCULAR CATHETERIZATION Right 08/01/2016   Procedure: Lower Extremity Angiography;  Surgeon: Renford Dills, MD;  Location: [facility] INVASIVE CV LAB;  Service: Cardiovascular;  Laterality: Right;   PORTA CATH INSERTION N/A 10/05/2020   Procedure: PORTA CATH INSERTION;  Surgeon: Renford Dills, MD;  Location: [facility] INVASIVE CV LAB;  Service: Cardiovascular;  Laterality: N/A;   stent placement in right leg Right    VIDEO BRONCHOSCOPY N/A 09/14/2020   Procedure: VIDEO BRONCHOSCOPY WITH FLUORO;  Surgeon: Salena Saner, MD;  Location: [facility] ORS;  Service: Cardiopulmonary;  Laterality: N/A;   VIDEO BRONCHOSCOPY WITH ENDOBRONCHIAL ULTRASOUND N/A 09/14/2020   Procedure: VIDEO BRONCHOSCOPY WITH ENDOBRONCHIAL ULTRASOUND;  Surgeon: Salena Saner, MD;  Location: [facility] ORS;  Service: Cardiopulmonary;  Laterality: N/A;    SOCIAL HISTORY: Social History   Socioeconomic History   Marital status: Single    Spouse name: Not on file   Number of children: Not on file   Years of education: Not on file   Highest education level: Not on file  Occupational History   Occupation: unemployed  Tobacco Use   Smoking status: Former    Current packs/day: 0.00    Average packs/day: 2.0 packs/day for 38.0 years (76.0 ttl pk-yrs)    Types: Cigarettes    Start date: 12/19/1976    Quit date: 12/20/2014    Years since quitting: 8.4   Smokeless tobacco: Never   Tobacco comments:    quit smoking in 2017 most smoked 2   Vaping Use   Vaping status: Former  Substance and Sexual Activity   Alcohol use: Not Currently   Drug use: No   Sexual activity: Yes  Other Topics Concern   Not on file  Social History Narrative   Not on file   Social Determinants of Health   Financial Resource Strain: Low Risk  (12/29/2022)   [facility], [facility]   Overall Financial Resource Strain (CARDIA)    Difficulty of Paying Living Expenses: Not hard at all  Food Insecurity: No Food Insecurity (12/29/2022)   Received [facility], [facility]   Hunger Vital Sign    Worried About Running Out of Food in the Last Year: Never true    Ran Out of Food in the Last Year: Never true  Transportation Needs: No Transportation Needs (12/29/2022)   Received from [facility], [facility]   [facility] - Transportation    In the past 12 months, has lack of transportation kept you from medical appointments or from getting medications?: No    Lack of Transportation (Non-Medical): No  Physical Activity: Not on file  Stress: Not on file  Social Connections: Not on file  Intimate Partner Violence: Not on file    FAMILY HISTORY: Family History  Problem Relation Age of Onset   Diabetes Mother    Hypertension Mother    Heart murmur Mother    Leukemia Mother  Throat cancer Maternal Grandmother    Colon cancer Neg Hx    Breast cancer Neg Hx     ALLERGIES:  has No Known Allergies.  MEDICATIONS:  Current Outpatient Medications  Medication Sig Dispense Refill   aspirin EC 81 MG tablet Take 81 mg by mouth daily. Swallow whole.     clopidogrel (PLAVIX) 75 MG tablet TAKE 1 TABLET BY MOUTH DAILY 100 tablet 2   levothyroxine (SYNTHROID) 100 MCG tablet Take 1 tablet (100 mcg total) by mouth daily before breakfast. 90 tablet 0   levothyroxine (SYNTHROID) 50 MCG tablet Take 0.5 tablets (25 mcg total) by mouth daily before breakfast. Take with his levothyroxine. 45 tablet 0   potassium chloride (KLOR-CON M) 10 MEQ tablet Take 1 tablet (10 mEq total) by mouth 2 (two) times daily. 30 tablet 1   simvastatin (ZOCOR) 40 MG tablet Take 40 mg by mouth at bedtime.     No current facility-administered medications for this visit.   Facility-Administered Medications Ordered in Other  Visits  Medication Dose Route Frequency Provider Last Rate Last Admin   albuterol (PROVENTIL) (2.5 MG/3ML) 0.083% nebulizer solution 2.5 mg  2.5 mg Nebulization Once Salena Saner, MD         PHYSICAL EXAMINATION: ECOG PERFORMANCE STATUS: 1 - Symptomatic but completely ambulatory Vitals:   05/14/23 1329 05/14/23 1336  BP: (!) 158/71 128/70  Pulse: 88   Resp: 18   Temp: 97.6 F (36.4 C)   SpO2: 94%    Filed Weights   05/14/23 1329  Weight: 218 lb 4.8 oz (99 kg)    Physical Exam Constitutional:      General: He is not in acute distress.    Appearance: He is obese. He is not diaphoretic.     Comments: Patient walks with a cane  HENT:     Head: Normocephalic and atraumatic.  Eyes:     General: No scleral icterus. Cardiovascular:     Rate and Rhythm: Normal rate and regular rhythm.     Heart sounds: No murmur heard. Pulmonary:     Effort: Pulmonary effort is normal. No respiratory distress.  Abdominal:     General: There is no distension.     Palpations: Abdomen is soft.     Tenderness: There is no abdominal tenderness.  Musculoskeletal:     Cervical back: Normal range of motion and neck supple.  Skin:    General: Skin is warm and dry.     Findings: No erythema.  Neurological:     Mental Status: He is alert and oriented to person, place, and time. Mental status is at baseline.     Cranial Nerves: No cranial nerve deficit.     Motor: No abnormal muscle tone.  Psychiatric:        Mood and Affect: Mood and affect normal.      LABORATORY DATA:  I have reviewed the data as listed    Latest Ref Rng & Units 05/14/2023    1:08 PM 04/30/2023   12:48 PM 04/16/2023    1:02 PM  CBC  WBC 4.0 - 10.5 K/uL 6.2  5.7  4.9   Hemoglobin 13.0 - 17.0 g/dL 16.1  09.6  04.5   Hematocrit 39.0 - 52.0 % 38.7  39.5  39.6   Platelets 150 - 400 K/uL 209  242  213       Latest Ref Rng & Units 05/14/2023    1:08 PM 04/30/2023   12:48 PM 04/16/2023  1:02 PM  CMP  Glucose 70 -  99 mg/dL 161  096  045   BUN 8 - 23 mg/dL 13  17  14    Creatinine 0.61 - 1.24 mg/dL 4.09  8.11  9.14   Sodium 135 - 145 mmol/L 141  138  138   Potassium 3.5 - 5.1 mmol/L 3.3  3.2  3.4   Chloride 98 - 111 mmol/L 107  103  107   CO2 22 - 32 mmol/L 26  23  24    Calcium 8.9 - 10.3 mg/dL 8.8  9.1  8.6   Total Protein 6.5 - 8.1 g/dL 6.5  7.0  6.8   Total Bilirubin 0.3 - 1.2 mg/dL 0.3  0.6  0.6   Alkaline Phos 38 - 126 U/L 86  82  82   AST 15 - 41 U/L 22  28  21    ALT 0 - 44 U/L 20  18  15       RADIOGRAPHIC STUDIES: I have personally reviewed the radiological images as listed and agreed with the findings in the report. MR BRAIN W WO CONTRAST  Result Date: 05/10/2023 CLINICAL DATA:  67 year old male with metastatic lung cancer. Prior SRS, and salvage SRS 5 months ago. Restaging. EXAM: MRI HEAD WITHOUT AND WITH CONTRAST TECHNIQUE: Multiplanar, multiecho pulse sequences of the brain and surrounding structures were obtained without and with intravenous contrast. CONTRAST:  10mL GADAVIST GADOBUTROL 1 MMOL/ML IV SOLN COMPARISON:  Brain MRI 02/05/2023 and earlier. FINDINGS: BRAIN New Lesions: None. Larger lesions: Irregular mostly rim enhancing lesion in the superior left parietal lobe near the vertex has increased fro 10-11 mm in July to now 16-17 mm, more triangular than rounded now (series 18, image 18) and increased regional T2/FLAIR hyperintense increased edema (series 9, image 41). See postcontrast series 16, image 145, series 17, image 112, series 18 image 18. DWI heterogeneity has also mildly increased. Stable or Smaller lesions: Left anterior and inferior cerebellar enhancing lesion on series 16, image 37, 3-4 mm. Right occipital pole rim enhancing lesion on series 16, image 59, now 9-10 mm and slightly smaller. Surrounding mild T2/FLAIR has regressed. Left operculum nodular and rim enhancing lesion which remains about 19 mm overall but appears slightly contracted since July (series 16, image 103).  Confluent surrounding T2/FLAIR hyperintensity in a vasogenic edema pattern has not significantly changed and is contiguous with the T2/FLAIR abnormality of superior left parietal lesion. DWI heterogeneity is stable. Other Brain findings: Mild mass effect on the left lateral ventricle is slightly more apparent since July but there is no significant midline shift (series 9, image 32). Other widely scattered nonenhancing cerebral white matter T2 and FLAIR hyperintensity is stable. Stable mild hemosiderin. No restricted diffusion to suggest acute infarction. No ventriculomegaly, extra-axial collection or acute intracranial hemorrhage. Cervicomedullary junction and pituitary are within normal limits. Vascular: Major intracranial vascular flow voids are stable. Dominant left vertebral artery. Following contrast major dural venous sinuses remain patent. Skull and upper cervical spine: Stable and within normal limits. Sinuses/Orbits: Stable, negative. Other: Mastoids remain well aerated. Visible internal auditory structures appear normal. Negative visible scalp and face. IMPRESSION: 1. Since July progression of a single lesion at the superior left perirolandic area; mildly larger (up to 17 mm) and with increased regional T2/FLAIR. No significant intracranial mass effect. Favor treatment effect/Radiation Necrosis, as the three additional lesions are all stable or smaller, and no new metastasis is identified. 2. No new intracranial abnormality. Electronically Signed   By: Odessa Fleming  M.D.   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 * Tracking Code: BO * EXAM: CT CHEST, ABDOMEN, AND PELVIS WITH CONTRAST TECHNIQUE: Multidetector CT imaging of the chest, abdomen and pelvis was performed following the standard protocol during bolus administration of intravenous contrast. RADIATION DOSE REDUCTION:  This exam was performed according to the departmental dose-optimization program which includes automated exposure control, adjustment of the mA and/or kV according to patient size and/or use of iterative reconstruction technique. CONTRAST:  OMNIPAQUE IOHEXOL 350 MG/ML SOLN COMPARISON:  11/25/2022 FINDINGS: CT CHEST FINDINGS Cardiovascular: Aortic atherosclerosis. Right Port-A-Cath tip high right atrium. Normal heart size, without pericardial effusion. Lad and right coronary artery calcification. No central pulmonary embolism, on this non-dedicated study. Mediastinum/Nodes: No supraclavicular adenopathy. No mediastinal or hilar adenopathy. Lungs/Pleura: trace right pleural fluid or thickening is new or increased. Patent airways. Mild centrilobular emphysema. Similar appearance of central right upper lobe and perihilar consolidation with volume loss and traction bronchiectasis, consistent with radiation fibrosis. No local recurrence. The interstitial opacities including within the left lower lobe have resolved. Musculoskeletal: No acute osseous abnormality. CT ABDOMEN PELVIS FINDINGS Hepatobiliary: Mild hepatic steatosis without focal liver lesion or biliary abnormality. Pancreas: Normal, without mass or ductal dilatation. Spleen: Normal in size, without focal abnormality. Adrenals/Urinary Tract: Normal adrenal glands. Normal kidneys, without hydronephrosis. Normal urinary bladder. Stomach/Bowel: Normal stomach, without wall thickening. Extensive colonic diverticulosis. Normal terminal ileum and appendix. Normal small bowel. Vascular/Lymphatic: Aortic atherosclerosis. No abdominopelvic adenopathy. Right superficial femoral artery grafts are incompletely imaged but again appear occluded. Reproductive: Normal prostate.  Small bilateral hydroceles. Other: No significant free fluid. No evidence of omental or peritoneal disease. Musculoskeletal: Partial degenerative fusion of the bilateral sacroiliac joints. Lumbar  spine fixation at L5-S1. IMPRESSION: 1. 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 2. Trace right pleural fluid or thickening, new or minimally increased. 3. Incidental findings, including: Aortic atherosclerosis (ICD10-I70.0), coronary artery atherosclerosis and emphysema (ICD10-J43.9). Hepatic steatosis. Electronically Signed   By: Jeronimo Greaves M.D.   On: 03/16/2023 14:43

## 2023-05-14 NOTE — Assessment & Plan Note (Signed)
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  May 2024 CT scan showed Improving bilateral scattered interstitial and ground-glass opacities. Discussed with pulmonology Dr. Jayme Cloud. 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  Labs are reviewed and discussed with patient. Proceed with Durvalumab.

## 2023-05-14 NOTE — Assessment & Plan Note (Addendum)
Status post radiation,  02/19/22 MRI showed decreased brain lesion size and edema.  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 Mitzi Hansen.  Follow up with Dr. Barbaraann Cao.

## 2023-05-22 ENCOUNTER — Ambulatory Visit: Payer: Medicare Other | Admitting: Internal Medicine

## 2023-05-26 ENCOUNTER — Other Ambulatory Visit: Payer: Self-pay

## 2023-05-28 ENCOUNTER — Inpatient Hospital Stay (HOSPITAL_BASED_OUTPATIENT_CLINIC_OR_DEPARTMENT_OTHER): Payer: Medicare Other | Admitting: Oncology

## 2023-05-28 ENCOUNTER — Inpatient Hospital Stay: Payer: Medicare Other

## 2023-05-28 ENCOUNTER — Encounter: Payer: Self-pay | Admitting: Oncology

## 2023-05-28 ENCOUNTER — Inpatient Hospital Stay: Payer: Medicare Other | Attending: Oncology

## 2023-05-28 VITALS — BP 150/73 | HR 88 | Temp 97.8°F | Resp 18 | Wt 220.3 lb

## 2023-05-28 DIAGNOSIS — E876 Hypokalemia: Secondary | ICD-10-CM | POA: Diagnosis not present

## 2023-05-28 DIAGNOSIS — Z5112 Encounter for antineoplastic immunotherapy: Secondary | ICD-10-CM | POA: Diagnosis not present

## 2023-05-28 DIAGNOSIS — R7989 Other specified abnormal findings of blood chemistry: Secondary | ICD-10-CM | POA: Insufficient documentation

## 2023-05-28 DIAGNOSIS — Z452 Encounter for adjustment and management of vascular access device: Secondary | ICD-10-CM | POA: Insufficient documentation

## 2023-05-28 DIAGNOSIS — C3491 Malignant neoplasm of unspecified part of right bronchus or lung: Secondary | ICD-10-CM

## 2023-05-28 DIAGNOSIS — E032 Hypothyroidism due to medicaments and other exogenous substances: Secondary | ICD-10-CM | POA: Diagnosis not present

## 2023-05-28 DIAGNOSIS — C3411 Malignant neoplasm of upper lobe, right bronchus or lung: Secondary | ICD-10-CM | POA: Diagnosis present

## 2023-05-28 DIAGNOSIS — C7931 Secondary malignant neoplasm of brain: Secondary | ICD-10-CM | POA: Diagnosis present

## 2023-05-28 DIAGNOSIS — Z79890 Hormone replacement therapy (postmenopausal): Secondary | ICD-10-CM | POA: Diagnosis not present

## 2023-05-28 LAB — CBC WITH DIFFERENTIAL/PLATELET
Abs Immature Granulocytes: 0.03 10*3/uL (ref 0.00–0.07)
Basophils Absolute: 0.1 10*3/uL (ref 0.0–0.1)
Basophils Relative: 1 %
Eosinophils Absolute: 0.1 10*3/uL (ref 0.0–0.5)
Eosinophils Relative: 1 %
HCT: 38.2 % — ABNORMAL LOW (ref 39.0–52.0)
Hemoglobin: 12.5 g/dL — ABNORMAL LOW (ref 13.0–17.0)
Immature Granulocytes: 0 %
Lymphocytes Relative: 25 %
Lymphs Abs: 1.7 10*3/uL (ref 0.7–4.0)
MCH: 26.9 pg (ref 26.0–34.0)
MCHC: 32.7 g/dL (ref 30.0–36.0)
MCV: 82.2 fL (ref 80.0–100.0)
Monocytes Absolute: 0.9 10*3/uL (ref 0.1–1.0)
Monocytes Relative: 13 %
Neutro Abs: 4 10*3/uL (ref 1.7–7.7)
Neutrophils Relative %: 60 %
Platelets: 228 10*3/uL (ref 150–400)
RBC: 4.65 MIL/uL (ref 4.22–5.81)
RDW: 14.5 % (ref 11.5–15.5)
WBC: 6.8 10*3/uL (ref 4.0–10.5)
nRBC: 0 % (ref 0.0–0.2)

## 2023-05-28 LAB — COMPREHENSIVE METABOLIC PANEL
ALT: 20 U/L (ref 0–44)
AST: 20 U/L (ref 15–41)
Albumin: 3.7 g/dL (ref 3.5–5.0)
Alkaline Phosphatase: 75 U/L (ref 38–126)
Anion gap: 8 (ref 5–15)
BUN: 21 mg/dL (ref 8–23)
CO2: 25 mmol/L (ref 22–32)
Calcium: 8.6 mg/dL — ABNORMAL LOW (ref 8.9–10.3)
Chloride: 107 mmol/L (ref 98–111)
Creatinine, Ser: 1.52 mg/dL — ABNORMAL HIGH (ref 0.61–1.24)
GFR, Estimated: 51 mL/min — ABNORMAL LOW (ref >60)
Glucose, Bld: 108 mg/dL — ABNORMAL HIGH (ref 70–99)
Potassium: 3.5 mmol/L (ref 3.5–5.1)
Sodium: 140 mmol/L (ref 135–145)
Total Bilirubin: 0.3 mg/dL (ref <1.2)
Total Protein: 6.6 g/dL (ref 6.5–8.1)

## 2023-05-28 MED ORDER — HEPARIN SOD (PORK) LOCK FLUSH 100 UNIT/ML IV SOLN
500.0000 [IU] | Freq: Once | INTRAVENOUS | Status: AC
Start: 2023-05-28 — End: 2023-05-28
  Administered 2023-05-28: 500 [IU] via INTRAVENOUS
  Filled 2023-05-28: qty 5

## 2023-05-28 NOTE — Assessment & Plan Note (Addendum)
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  May 2024 CT scan showed Improving bilateral scattered interstitial and ground-glass opacities. Discussed with pulmonology Dr. Jayme Cloud. 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  Labs are reviewed and discussed with patient. Hold off Durvalumab.

## 2023-05-28 NOTE — Progress Notes (Signed)
Hematology/Oncology Progress note [phone] [phone]    CHIEF COMPLAINTS/REASON FOR VISIT:  Follow up for lung cancer   ASSESSMENT & PLAN:   Cancer Staging  Squamous cell carcinoma of right lung (HCC) Staging form: Lung, AJCC 8th Edition - Clinical stage from 09/20/2020: Stage IV (cT1b, cN2, cM1) - Signed by [name], MD on 12/13/2021   Squamous cell carcinoma of right lung (HCC) Durvalumab was previously held due to pneumonitis.  May 2024 CT scan showed Improving bilateral scattered interstitial and ground-glass opacities. Discussed with pulmonology Dr. Jayme Cloud. 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  Labs are reviewed and discussed with patient. Hold off Durvalumab.    Encounter for antineoplastic immunotherapy Durvalumab treatment as planned.   Malignant neoplasm metastatic to brain [facility]) Status post radiation,  02/19/22 MRI showed decreased brain lesion size and edema.  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 Mitzi Hansen.  Follow up with Dr. Barbaraann Cao.    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  Recommend patient to avoid nephrotoxins encourage hydration    No orders of the defined types were placed in this encounter.   Follow up 1w lab MD Durvalumab All questions were answered. The patient knows to call the clinic with any problems, questions or concerns.  [name], MD, PhD [facility] Hematology Oncology 05/28/2023        HISTORY OF PRESENTING ILLNESS:  Joshua [name] is a 67 y.o. male who has above history reviewed by me today presents for follow up visit for Stage IV lung squamous cell carcinoma. Oncology History  Squamous cell carcinoma of right lung (HCC)  09/18/2020 Imaging   PET scan showed right upper lobe nodule maximal SUV 11.5.  Hypermetabolic cluster right paratracheal adenopathy with maximum SUV up to 12.  No findings of metastatic disease to the neck/abdomen/pelvis or skeleton.   09/20/2020 Initial Diagnosis   Squamous cell carcinoma of right lung (HCC) NGS showed PD-L1 TPS 95%, LRP1B S953fs, PIK3CA E545K, RB1 c1422-2A>T, RIT1 T83_ A84del, STK11 P243fs, TPS53 R248W, TMB 19.41mut/mb, MS stable.    09/20/2020 Cancer Staging   Staging form: Lung, AJCC 8th Edition - Clinical stage from 09/20/2020: Stage IV (cT1b, cN2, cM1) - Signed by [name], MD on 12/13/2021   10/03/2020 Imaging   MRI brain with and without contrast showed no evidence of intracranial metastatic disease. Well-defined round lesion within the left parotid tail measuring approximately 2.3 x 1.8 x 1.8 cm   10/09/2020 - 11/27/2020 Radiation Therapy   concurrent chemoradiation   10/10/2020 - 10/31/2020 Chemotherapy   10/10/2020 concurrent chemoradiation.  carboplatin AUC of 2 and Taxol 45 mg/m2 x 4.  Additional chemotherapy was held due to thrombocytopenia, neutropenia       12/31/2020 - 03/21/2022 Chemotherapy   Maintenance  Durvalumab q14d      12/31/2020 -  Chemotherapy   Patient is on Treatment Plan : LUNG Durvalumab (10) q14d     03/28/2021 Imaging   CT without contrast showed new linear right perihilar consolidation with associated traction bronchiectasis.  Likely postradiation changes.  Additional  new peribronchial vascular/nodular groundglass opacities with areas of consolidation or seen primarily in the right upper and lower lobes, more distant from expected radiation fields. Findings are possibly due to radiation-induced  organizing pneumonia   03/28/2021 Adverse Reaction   Durvalumab was held for possible  pneumonitis.  Per my discussion with pulmonology Dr. 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     05/09/2021 Imaging   Repeat CT chest without contrast showed evolving radiation changes in the right perihilar region likely accounting for interval increase to thickening of the posterior wall of the right upper lobe bronchus.  The more peripheral right lung groundglass opacities seen on most recent study have generally improved with exception of one peripherally right upper lobe which appears new.  These are likely inflammatory.  No findings highly suspicious for local recurrence.   05/30/2021 - 01/10/2022 Chemotherapy   Durvalumab q14d     10/07/2021 Imaging   MRI brain with and without contrast showed 2 contrast-enhancing lesions, most consistent with metastatic disease.  Largest lesion is in the left frontal lobe and measures up to 1.3 cm with a large amount of surrounding edema and 9 mm rightward midline shift.  The other enhancing lesion was 4 mm in the right occipital lobe with a small amount of surrounding edema.  No acute or chronic hemorrhage.   10/07/2021 Progression   Headache after colonoscopy, progressive difficulty using her right arm.  Progressively worsening of speech. Stage IV lung cancer with brain metastasis.  started on Dexamethasone.  He was seen by neurosurgery and radiation oncology.  Recommendation is to proceed with SRS.  Patient was discharged with dexamethasone   10/16/2021 Imaging   PET scan showed postradiation changes in the right supra hilar lung without evidence of local recurrence.  No evidence of lung cancer recurrence or metastatic disease.  Single focus of intense metabolic activity associated with posterior left 11th rib likely secondary to recent fall/posttraumatic metabolic changes.  Frontal  uptake in the left frontal lobe corresponding to the known  metastatic lesion in the brain   10/29/2021 -  Radiation Therapy   Brain Radiation [facility]   12/10/2021 Imaging   Brain MRI: Continued interval decrease in size of a left frontal lobe metastasis, now measuring 10 mm. Moderate surrounding edema has also decreased.  3-4 mm metastasis within the right occipital lobe, not significantly changed in size. There is now a small focus of central non enhancement within this lesion. Increased edema. 3 mm enhancing metastasis within the high left parietal lobe. In retrospect, this was likely present as a subtle punctate focus of enhancement on the prior brain MRI of 10/22/2021.   01/18/2022 Imaging   CT chest abdomen pelvis w contrast 1. Stable post treatment changes in the right upper lobe medially with dense radiation fibrosis but no findings to suggest local recurrent tumor. 2. Significant progression of ill-defined ground-glass opacity, interstitial thickening and airspace nodularity in the right upper lobe and upper aspect of the right lower lobe. Findings could reflect an inflammatory or atypical infectious process, drug induced pneumonitis or interstitial spread of tumor.3. No mediastinal or hilar mass or adenopathy. 4. No findings for abdominal/pelvic metastatic disease or osseous metastatic disease.   02/19/2022 Imaging   MRI brain w wo contrast showed Three metastatic deposits in the brain appear smaller. Decreased edema in the left parietal lobe. No new lesions    04/04/2022 Imaging   CT chest w contrast 1. Stable radiation changes in the right perihilar region without  evidence of local recurrence. 2. Interval improvement in the patchy ground-glass opacities previously demonstrated in both lungs, most consistent with a resolving infectious or inflammatory process (including immunotherapy-related pneumonitis). 3. No evidence of metastatic disease.   05/14/2022 Miscellaneous   He had a mechanical fall and fractured his left elbow. xrays showed a left  elbow fracture  05/26/22 seen by orthopedic surgeon, cast applied.    05/27/2022 Imaging   MRI brain w wo contrast 1. All three known brain metastases have enlarged since August, with increasing regional edema. And there is a small new left anterior cerebellar 4-5 mm metastasis (was questionably punctate on the May MRI). This constellation could reflect a combination of True Progression (Left Cerebellar lesion) and pseudoprogression/radionecrosis (3 existing mets).    2. No significant intracranial mass effect. 3. Underlying moderate cerebral white matter signal changes.   05/30/2022 Imaging   CT chest abdomen pelvis w contrast  1. 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 There is a stable small right pleural effusion, but no definitive findings to suggest residual or recurrent disease on today's examination. 2. No signs of metastatic disease in the abdomen or pelvis.  3. Aortic atherosclerosis, in addition to two-vessel coronary artery disease. Please note that although the presence of coronary artery calcium documents the presence of coronary artery disease, the severity of this disease and any potential stenosis cannot be assessed on this non-gated CT examination. Assessment for potential risk factor modification, dietary therapy or pharmacologic therapy may be warranted, if clinically indicated. 4. Hepatic steatosis. 5. Colonic diverticulosis without evidence of acute diverticulitis at this time. 6. Incompletely imaged but apparently occluded vascular graft in the superior aspect of the right anterior thigh. Referral to vascular specialist for further clinical evaluation should be considered if clinically appropriate. 7. Additional incidental findings, as above.   07/22/2022 Imaging   MRI brain  1. No new or enlarging lesions. No intracranial disease progression. 2.  Decreased size of treated lesions of the left frontoparietal junction and left postcentral gyrus. 3. Unchanged lesions in the left cerebellum and right occipital lobe.   10/15/2022 Imaging   MRI brain w wo contrast showed Interval increase in size of multiple intraparenchymal lesions with worsened surrounding vasogenic edema. 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 No new lesions   11/05/2022 Imaging   MRI brain w wo contrast  1. Likely slight increase and right occipital and left cerebellar lesions. Otherwise, unchanged metastases, as above. No new lesions identified. 2. Partially imaged large probable lymph node in the left upper neck, apparent on prior PET CT.   11/13/2022 -  Radiation Therapy   Each of the sites were treated with SRS/SBRT/SRT-IMRT technique to 20 Gy in 1 fraction to each of the following sites: PTV_2_LCerebel_76mm PTV_3_RCerebel_33mm PTV_4_ROccip_27mm PTV_5_LParietal_34mm   11/27/2022 Imaging   CT chest abdomen pelvis w contrast showed  Improving bilateral scattered interstitial and ground-glass opacities. Small amount of residual areas in the extreme left lower lobe inferiorly. This specific area is slightly increased from previous but overall the amount of opacities is significantly improved. Recommend continued follow-up. No developing new mass lesion, fluid collection or lymph node enlargement. Stable posttreatment changes right perihilar in the lung. Fatty liver infiltration. Colonic diverticulosis. Improved tiny right pleural effusion   02/05/2023 Imaging   MRI brain with and without contrast Interval decrease in all of the metastatic lesions, detailed above.No  new lesions identified.    Imaging   CT chest abdomen pelvis w contrast showed  1. 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 2. Trace right pleural fluid or  thickening, new or minimally increased. 3. Incidental findings, including: Aortic atherosclerosis (ICD10-I70.0), coronary artery atherosclerosis and emphysema (ICD10-J43.9). Hepatic steatosis.    Today he feels well.Intermittent headache, is better. denies any focal deficits. chronic SOB,not worse.     Review of Systems  Constitutional:  Positive for fatigue. Negative for appetite change, chills, fever and unexpected weight change.  HENT:   Negative for hearing loss and voice change.   Eyes:  Negative for eye problems and icterus.  Respiratory:  Negative for chest tightness, cough and shortness of breath.   Cardiovascular:  Negative for chest pain and leg swelling.  Gastrointestinal:  Negative for abdominal distention and abdominal pain.  Endocrine: Negative for hot flashes.  Genitourinary:  Negative for difficulty urinating, dysuria and frequency.   Musculoskeletal:  Negative for arthralgias.  Skin:  Negative for itching and rash.  Neurological:  Positive for extremity weakness. Negative for headaches, light-headedness, numbness and speech difficulty.       + tongue was thick and right face numbness.  Hematological:  Negative for adenopathy. Does not bruise/bleed easily.  Psychiatric/Behavioral:  Negative for confusion.       MEDICAL HISTORY:  Past Medical History:  Diagnosis Date   Allergy    Cancer (HCC)    lung   COPD (chronic obstructive pulmonary disease) (HCC)    Dyspnea    former smoker. only doe   History of kidney stones    Hypertension    Peripheral vascular disease (HCC)    Pre-diabetes    Sleep apnea     SURGICAL HISTORY: Past Surgical History:  Procedure Laterality Date   ANTERIOR CERVICAL DECOMP/DISCECTOMY FUSION N/A 03/14/2020   Procedure: ANTERIOR CERVICAL DECOMPRESSION/DISCECTOMY FUSION 3 LEVELS C4-7;  Surgeon: Venetia Night, MD;  Location: [facility] ORS;  Service: Neurosurgery;  Laterality: N/A;   BACK SURGERY  2011   CENTRAL LINE INSERTION Right  09/10/2016   Procedure: CENTRAL LINE INSERTION;  Surgeon: Renford Dills, MD;  Location: [facility] ORS;  Service: Vascular;  Laterality: Right;   CERVICAL FUSION     C4-c7 on Mar 14, 2020   COLONOSCOPY  2013 ?   COLONOSCOPY N/A 09/09/2021   Procedure: COLONOSCOPY;  Surgeon: Jaynie Collins, DO;  Location: [facility] ENDOSCOPY;  Service: Gastroenterology;  Laterality: N/A;   CORONARY ANGIOPLASTY     ESOPHAGOGASTRODUODENOSCOPY N/A 09/09/2021   Procedure: ESOPHAGOGASTRODUODENOSCOPY (EGD);  Surgeon: Jaynie Collins, DO;  Location: [facility] ENDOSCOPY;  Service: Gastroenterology;  Laterality: N/A;   FEMORAL-POPLITEAL BYPASS GRAFT Right 09/10/2016   Procedure: BYPASS GRAFT FEMORAL-POPLITEAL ARTERY ( BELOW KNEE );  Surgeon: Renford Dills, MD;  Location: [facility] ORS;  Service: Vascular;  Laterality: Right;   FEMORAL-TIBIAL BYPASS GRAFT Right 03/23/2020   Procedure: BYPASS GRAFT RIGHT FEMORAL- DISTAL TIBIAL ARTERY WITH DISTAL FLOW GRAFT;  Surgeon: Renford Dills, MD;  Location: [facility] ORS;  Service: Vascular;  Laterality: Right;   LEFT HEART CATH AND CORONARY ANGIOGRAPHY Left 05/16/2021   Procedure: LEFT HEART CATH AND CORONARY ANGIOGRAPHY;  Surgeon: Armando Reichert, MD;  Location: [facility] INVASIVE CV LAB;  Service: Cardiovascular;  Laterality: Left;   LOWER EXTREMITY ANGIOGRAPHY Right 11/18/2016   Procedure: Lower Extremity Angiography;  Surgeon: Renford Dills, MD;  Location: [facility] INVASIVE CV LAB;  Service: Cardiovascular;  Laterality: Right;  LOWER EXTREMITY ANGIOGRAPHY Right 12/09/2016   Procedure: Lower Extremity Angiography;  Surgeon: Renford Dills, MD;  Location: [facility] INVASIVE CV LAB;  Service: Cardiovascular;  Laterality: Right;   LOWER EXTREMITY ANGIOGRAPHY Right 11/15/2019   Procedure: LOWER EXTREMITY ANGIOGRAPHY;  Surgeon: Renford Dills, MD;  Location: [facility] INVASIVE CV LAB;  Service: Cardiovascular;  Laterality: Right;   LOWER EXTREMITY ANGIOGRAPHY Right 12/01/2019   Procedure: Lower  Extremity Angiography;  Surgeon: Annice Needy, MD;  Location: [facility] INVASIVE CV LAB;  Service: Cardiovascular;  Laterality: Right;   LOWER EXTREMITY ANGIOGRAPHY Right 12/02/2019   Procedure: LOWER EXTREMITY ANGIOGRAPHY;  Surgeon: Renford Dills, MD;  Location: [facility] INVASIVE CV LAB;  Service: Cardiovascular;  Laterality: Right;   LOWER EXTREMITY ANGIOGRAPHY Right 03/23/2020   Procedure: Lower Extremity Angiography;  Surgeon: Renford Dills, MD;  Location: [facility] INVASIVE CV LAB;  Service: Cardiovascular;  Laterality: Right;   LOWER EXTREMITY ANGIOGRAPHY Right 09/25/2021   Procedure: Lower Extremity Angiography;  Surgeon: Renford Dills, MD;  Location: [facility] INVASIVE CV LAB;  Service: Cardiovascular;  Laterality: Right;   LOWER EXTREMITY ANGIOGRAPHY Right 09/26/2021   Procedure: Lower Extremity Angiography;  Surgeon: Annice Needy, MD;  Location: [facility] INVASIVE CV LAB;  Service: Cardiovascular;  Laterality: Right;   LOWER EXTREMITY ANGIOGRAPHY Right 04/08/2022   Procedure: Lower Extremity Angiography;  Surgeon: Renford Dills, MD;  Location: [facility] INVASIVE CV LAB;  Service: Cardiovascular;  Laterality: Right;   LOWER EXTREMITY INTERVENTION  11/18/2016   Procedure: Lower Extremity Intervention;  Surgeon: Renford Dills, MD;  Location: [facility] INVASIVE CV LAB;  Service: Cardiovascular;;   PERIPHERAL VASCULAR CATHETERIZATION Right 01/02/2015   Procedure: Lower Extremity Angiography;  Surgeon: Renford Dills, MD;  Location: [facility] INVASIVE CV LAB;  Service: Cardiovascular;  Laterality: Right;   PERIPHERAL VASCULAR CATHETERIZATION Right 06/18/2015   Procedure: Lower Extremity Angiography;  Surgeon: Annice Needy, MD;  Location: [facility] INVASIVE CV LAB;  Service: Cardiovascular;  Laterality: Right;   PERIPHERAL VASCULAR CATHETERIZATION  06/18/2015   Procedure: Lower Extremity Intervention;  Surgeon: Annice Needy, MD;  Location: [facility] INVASIVE CV LAB;  Service: Cardiovascular;;   PERIPHERAL VASCULAR  CATHETERIZATION N/A 10/09/2015   Procedure: Abdominal Aortogram w/Lower Extremity;  Surgeon: Renford Dills, MD;  Location: [facility] INVAS[facility] CV LAB;  Service: Cardiovascular;  Laterality: N/A;   PERIPHERAL VASCULAR CATHETERIZATION  10/09/2015   Procedure: Lower Extremity Intervention;  Surgeon: Renford Dills, MD;  Location: [facility] INVASIVE CV LAB;  Service: Cardiovascular;;   PERIPHERAL VASCULAR CATHETERIZATION Right 10/10/2015   Procedure: Lower Extremity Angiography;  Surgeon: Annice Needy, MD;  Location: [facility] INV[facility] CV LAB;  Service: Cardiovascular;  Laterality: Right;   PERIPHERAL VASCULAR CATHETERIZATION Right 10/10/2015   Procedure: Lower Extremity Intervention;  Surgeon: Annice Needy, MD;  Location: [facility] INV[facility] LAB;  Service: Cardiovascular;  Laterality: Right;   PERIPHERAL VASCULAR CATHETERIZATION Right 12/03/2015   Procedure: Lower Extremity Angiography;  Surgeon: Annice Needy, MD;  Location: [facility] IN[facility];  Service: Cardiovascular;  Laterality: Right;   PERIPHERAL VASCULAR CATHETERIZATION Right 12/04/2015   Procedure: Lower Extremity Angiography;  Surgeon: Annice Needy, MD;  Location: [facility] INV[facility] CV LAB;  Service: Cardiovascular;  Laterality: Right;   PERIPHERAL VASCULAR CATHETERIZATION  12/04/2015   Procedure: Lower Extremity Intervention;  Surgeon: Annice Needy, MD;  Location: [facility] INVASIVE CV LAB;  Service: Cardiovascular;;   PERIPHERAL VASCULAR CATHETERIZATION Right 06/30/2016   Procedure: Lower Extremity Angiography;  Surgeon: Annice Needy, MD;  Location: [facility]  INVASIVE CV LAB;  Service: Cardiovascular;  Laterality: Right;   PERIPHERAL VASCULAR CATHETERIZATION Right 06/25/2016   Procedure: Lower Extremity Angiography;  Surgeon: Renford Dills, MD;  Location: [facility] INVASIVE CV LAB;  Service: Cardiovascular;  Laterality: Right;   PERIPHERAL VASCULAR CATHETERIZATION Right 07/01/2016   Procedure: Lower Extremity Angiography;  Surgeon: Renford Dills, MD;  Location: [facility] INVASIVE  CV LAB;  Service: Cardiovascular;  Laterality: Right;   PERIPHERAL VASCULAR CATHETERIZATION  07/01/2016   Procedure: Lower Extremity Intervention;  Surgeon: Renford Dills, MD;  Location: [facility] INVASIVE CV LAB;  Service: Cardiovascular;;   PERIPHERAL VASCULAR CATHETERIZATION Right 08/01/2016   Procedure: Lower Extremity Angiography;  Surgeon: Renford Dills, MD;  Location: [facility] INVASIVE CV LAB;  Service: Cardiovascular;  Laterality: Right;   PORTA CATH INSERTION N/A 10/05/2020   Procedure: PORTA CATH INSERTION;  Surgeon: Renford Dills, MD;  Location: [facility] INVASIVE CV LAB;  Service: Cardiovascular;  Laterality: N/A;   stent placement in right leg Right    VIDEO BRONCHOSCOPY N/A 09/14/2020   Procedure: VIDEO BRONCHOSCOPY WITH FLUORO;  Surgeon: Salena Saner, MD;  Location: [facility] ORS;  Service: Cardiopulmonary;  Laterality: N/A;   VIDEO BRONCHOSCOPY WITH ENDOBRONCHIAL ULTRASOUND N/A 09/14/2020   Procedure: VIDEO BRONCHOSCOPY WITH ENDOBRONCHIAL ULTRASOUND;  Surgeon: Salena Saner, MD;  Location: [facility] ORS;  Service: Cardiopulmonary;  Laterality: N/A;    SOCIAL HISTORY: Social History   Socioeconomic History   Marital status: Single    Spouse name: Not on file   Number of children: Not on file   Years of education: Not on file   Highest education level: Not on file  Occupational History   Occupation: unemployed  Tobacco Use   Smoking status: Former    Current packs/day: 0.00    Average packs/day: 2.0 packs/day for 38.0 years (76.0 ttl pk-yrs)    Types: Cigarettes    Start date: 12/19/1976    Quit date: 12/20/2014    Years since quitting: 8.4   Smokeless tobacco: Never   Tobacco comments:    quit smoking in 2017 most smoked 2   Vaping Use   Vaping status: Former  Substance and Sexual Activity   Alcohol use: Not Currently   Drug use: No   Sexual activity: Yes  Other Topics Concern   Not on file  Social History Narrative   Not on file   Social Determinants of Health    Financial Resource Strain: Low Risk  (12/29/2022)   Received [facility], [facility]   Overall Financial Resource Strain (CARDIA)    Difficulty of Paying Living Expenses: Not hard at all  Food Insecurity: No Food Insecurity (12/29/2022)   Received from [facility], [facility]   Hunger Vital Sign    Worried About Running Out of Food in the Last Year: Never true    Ran Out of Food in the Last Year: Never true  Transportation Needs: No Transportation Needs (12/29/2022)   Received from [facility], [facility]   [facility] - Transportation    In the past 12 months, has lack of transportation kept you from medical appointments or from getting medications?: No    Lack of Transportation (Non-Medical): No  Physical Activity: Not on file  Stress: Not on file  Social Connections: Not on file  Intimate Partner Violence: Not on file    FAMILY HISTORY: Family History  Problem Relation Age of Onset   Diabetes Mother  Hypertension Mother    Heart murmur Mother    Leukemia Mother    Throat cancer Maternal Grandmother    Colon cancer Neg Hx    Breast cancer Neg Hx     ALLERGIES:  has No Known Allergies.  MEDICATIONS:  Current Outpatient Medications  Medication Sig Dispense Refill   aspirin EC 81 MG tablet Take 81 mg by mouth daily. Swallow whole.     clopidogrel (PLAVIX) 75 MG tablet TAKE 1 TABLET BY MOUTH DAILY 100 tablet 2   levothyroxine (SYNTHROID) 100 MCG tablet Take 1 tablet (100 mcg total) by mouth daily before breakfast. 90 tablet 0   levothyroxine (SYNTHROID) 50 MCG tablet Take 0.5 tablets (25 mcg total) by mouth daily before breakfast. Take with his levothyroxine. 45 tablet 0   potassium chloride (KLOR-CON M) 10 MEQ tablet Take 1 tablet (10 mEq total) by mouth 2 (two) times daily. 30 tablet 1   simvastatin (ZOCOR) 40 MG tablet Take 40 mg by mouth at bedtime.      No current facility-administered medications for this visit.   Facility-Administered Medications Ordered in Other Visits  Medication Dose Route Frequency Provider Last Rate Last Admin   albuterol (PROVENTIL) (2.5 MG/3ML) 0.083% nebulizer solution 2.5 mg  2.5 mg Nebulization Once Salena Saner, MD         PHYSICAL EXAMINATION: ECOG PERFORMANCE STATUS: 1 - Symptomatic but completely ambulatory There were no vitals filed for this visit.  There were no vitals filed for this visit.   Physical Exam Constitutional:      General: He is not in acute distress.    Appearance: He is obese. He is not diaphoretic.     Comments: Patient walks with a cane  HENT:     Head: Normocephalic and atraumatic.  Eyes:     General: No scleral icterus. Cardiovascular:     Rate and Rhythm: Normal rate and regular rhythm.     Heart sounds: No murmur heard. Pulmonary:     Effort: Pulmonary effort is normal. No respiratory distress.  Abdominal:     General: There is no distension.     Palpations: Abdomen is soft.     Tenderness: There is no abdominal tenderness.  Musculoskeletal:     Cervical back: Normal range of motion and neck supple.  Skin:    General: Skin is warm and dry.     Findings: No erythema.  Neurological:     Mental Status: He is alert and oriented to person, place, and time. Mental status is at baseline.     Cranial Nerves: No cranial nerve deficit.     Motor: No abnormal muscle tone.  Psychiatric:        Mood and Affect: Mood and affect normal.      LABORATORY DATA:  I have reviewed the data as listed    Latest Ref Rng & Units 05/28/2023    1:00 PM 05/14/2023    1:08 PM 04/30/2023   12:48 PM  CBC  WBC 4.0 - 10.5 K/uL 6.8  6.2  5.7   Hemoglobin 13.0 - 17.0 g/dL 40.9  81.1  91.4   Hematocrit 39.0 - 52.0 % 38.2  38.7  39.5   Platelets 150 - 400 K/uL 228  209  242       Latest Ref Rng & Units 05/14/2023    1:08 PM 04/30/2023   12:48 PM 04/16/2023    1:02 PM  CMP   Glucose 70 - 99 mg/dL 782  160  122   BUN 8 - 23 mg/dL 13  17  14    Creatinine 0.61 - 1.24 mg/dL 8.29  5.62  1.30   Sodium 135 - 145 mmol/L 141  138  138   Potassium 3.5 - 5.1 mmol/L 3.3  3.2  3.4   Chloride 98 - 111 mmol/L 107  103  107   CO2 22 - 32 mmol/L 26  23  24    Calcium 8.9 - 10.3 mg/dL 8.8  9.1  8.6   Total Protein 6.5 - 8.1 g/dL 6.5  7.0  6.8   Total Bilirubin 0.3 - 1.2 mg/dL 0.3  0.6  0.6   Alkaline Phos 38 - 126 U/L 86  82  82   AST 15 - 41 U/L 22  28  21    ALT 0 - 44 U/L 20  18  15       RADIOGRAPHIC STUDIES: I have personally reviewed the radiological images as listed and agreed with the findings in the report. MR BRAIN W WO CONTRAST  Result Date: 05/10/2023 CLINICAL DATA:  67 year old male with metastatic lung cancer. Prior SRS, and salvage SRS 5 months ago. Restaging. EXAM: MRI HEAD WITHOUT AND WITH CONTRAST TECHNIQUE: Multiplanar, multiecho pulse sequences of the brain and surrounding structures were obtained without and with intravenous contrast. CONTRAST:  10mL GADAVIST GADOBUTROL 1 MMOL/ML IV SOLN COMPARISON:  Brain MRI 02/05/2023 and earlier. FINDINGS: BRAIN New Lesions: None. Larger lesions: Irregular mostly rim enhancing lesion in the superior left parietal lobe near the vertex has increased fro 10-11 mm in July to now 16-17 mm, more triangular than rounded now (series 18, image 18) and increased regional T2/FLAIR hyperintense increased edema (series 9, image 41). See postcontrast series 16, image 145, series 17, image 112, series 18 image 18. DWI heterogeneity has also mildly increased. Stable or Smaller lesions: Left anterior and inferior cerebellar enhancing lesion on series 16, image 37, 3-4 mm. Right occipital pole rim enhancing lesion on series 16, image 59, now 9-10 mm and slightly smaller. Surrounding mild T2/FLAIR has regressed. Left operculum nodular and rim enhancing lesion which remains about 19 mm overall but appears slightly contracted since July (series  16, image 103). Confluent surrounding T2/FLAIR hyperintensity in a vasogenic edema pattern has not significantly changed and is contiguous with the T2/FLAIR abnormality of superior left parietal lesion. DWI heterogeneity is stable. Other Brain findings: Mild mass effect on the left lateral ventricle is slightly more apparent since July but there is no significant midline shift (series 9, image 32). Other widely scattered nonenhancing cerebral white matter T2 and FLAIR hyperintensity is stable. Stable mild hemosiderin. No restricted diffusion to suggest acute infarction. No ventriculomegaly, extra-axial collection or acute intracranial hemorrhage. Cervicomedullary junction and pituitary are within normal limits. Vascular: Major intracranial vascular flow voids are stable. Dominant left vertebral artery. Following contrast major dural venous sinuses remain patent. Skull and upper cervical spine: Stable and within normal limits. Sinuses/Orbits: Stable, negative. Other: Mastoids remain well aerated. Visible internal auditory structures appear normal. Negative visible scalp and face. IMPRESSION: 1. Since July progression of a single lesion at the superior left perirolandic area; mildly larger (up to 17 mm) and with increased regional T2/FLAIR. No significant intracranial mass effect. Favor treatment effect/Radiation Necrosis, as the three additional lesions are all stable or smaller, and no new metastasis is identified. 2. No new intracranial abnormality. Electronically Signed   By: Odessa Fleming M.D.   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 * Tracking Code: BO * EXAM: CT CHEST, ABDOMEN, AND PELVIS WITH CONTRAST TECHNIQUE: Multidetector CT imaging of the chest, abdomen and pelvis was performed following the standard protocol during bolus administration of intravenous contrast. RADIATION  DOSE REDUCTION: This exam was performed according to the departmental dose-optimization program which includes automated exposure control, adjustment of the mA and/or kV according to patient size and/or use of iterative reconstruction technique. CONTRAST:  OMNIPAQUE IOHEXOL 350 MG/ML SOLN COMPARISON:  11/25/2022 FINDINGS: CT CHEST FINDINGS Cardiovascular: Aortic atherosclerosis. Right Port-A-Cath tip high right atrium. Normal heart size, without pericardial effusion. Lad and right coronary artery calcification. No central pulmonary embolism, on this non-dedicated study. Mediastinum/Nodes: No supraclavicular adenopathy. No mediastinal or hilar adenopathy. Lungs/Pleura: trace right pleural fluid or thickening is new or increased. Patent airways. Mild centrilobular emphysema. Similar appearance of central right upper lobe and perihilar consolidation with volume loss and traction bronchiectasis, consistent with radiation fibrosis. No local recurrence. The interstitial opacities including within the left lower lobe have resolved. Musculoskeletal: No acute osseous abnormality. CT ABDOMEN PELVIS FINDINGS Hepatobiliary: Mild hepatic steatosis without focal liver lesion or biliary abnormality. Pancreas: Normal, without mass or ductal dilatation. Spleen: Normal in size, without focal abnormality. Adrenals/Urinary Tract: Normal adrenal glands. Normal kidneys, without hydronephrosis. Normal urinary bladder. Stomach/Bowel: Normal stomach, without wall thickening. Extensive colonic diverticulosis. Normal terminal ileum and appendix. Normal small bowel. Vascular/Lymphatic: Aortic atherosclerosis. No abdominopelvic adenopathy. Right superficial femoral artery grafts are incompletely imaged but again appear occluded. Reproductive: Normal prostate.  Small bilateral hydroceles. Other: No significant free fluid. No evidence of omental or peritoneal disease. Musculoskeletal: Partial degenerative fusion of the bilateral sacroiliac  joints. Lumbar spine fixation at L5-S1. IMPRESSION: 1. 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 2. Trace right pleural fluid or thickening, new or minimally increased. 3. Incidental findings, including: Aortic atherosclerosis (ICD10-I70.0), coronary artery atherosclerosis and emphysema (ICD10-J43.9). Hepatic steatosis. Electronically Signed   By: Jeronimo Greaves M.D.   On: 03/16/2023 14:43

## 2023-05-28 NOTE — Assessment & Plan Note (Signed)
Recommend potassium KCL BID

## 2023-05-28 NOTE — Assessment & Plan Note (Signed)
 continue levothyroxine 125 mcg daily

## 2023-05-28 NOTE — Assessment & Plan Note (Signed)
Status post radiation,  02/19/22 MRI showed decreased brain lesion size and edema.  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 Mitzi Hansen.  Follow up with Dr. Barbaraann Cao.

## 2023-05-28 NOTE — Assessment & Plan Note (Signed)
Durvalumab treatment as planned.  

## 2023-05-28 NOTE — Assessment & Plan Note (Signed)
Likely due to recent NSAID use.  Recommend patient to avoid nephrotoxins encourage hydration

## 2023-05-28 NOTE — Patient Instructions (Signed)

## 2023-06-02 ENCOUNTER — Ambulatory Visit (INDEPENDENT_AMBULATORY_CARE_PROVIDER_SITE_OTHER): Payer: Medicare Other | Admitting: Family Medicine

## 2023-06-02 ENCOUNTER — Encounter: Payer: Self-pay | Admitting: Family Medicine

## 2023-06-02 VITALS — BP 140/80 | Ht 71.0 in | Wt 216.0 lb

## 2023-06-02 DIAGNOSIS — G959 Disease of spinal cord, unspecified: Secondary | ICD-10-CM

## 2023-06-02 DIAGNOSIS — R030 Elevated blood-pressure reading, without diagnosis of hypertension: Secondary | ICD-10-CM | POA: Diagnosis not present

## 2023-06-02 DIAGNOSIS — R7303 Prediabetes: Secondary | ICD-10-CM | POA: Diagnosis not present

## 2023-06-02 DIAGNOSIS — E559 Vitamin D deficiency, unspecified: Secondary | ICD-10-CM

## 2023-06-02 DIAGNOSIS — E039 Hypothyroidism, unspecified: Secondary | ICD-10-CM

## 2023-06-02 DIAGNOSIS — N400 Enlarged prostate without lower urinary tract symptoms: Secondary | ICD-10-CM | POA: Insufficient documentation

## 2023-06-02 DIAGNOSIS — I743 Embolism and thrombosis of arteries of the lower extremities: Secondary | ICD-10-CM

## 2023-06-02 DIAGNOSIS — I70229 Atherosclerosis of native arteries of extremities with rest pain, unspecified extremity: Secondary | ICD-10-CM

## 2023-06-02 DIAGNOSIS — D701 Agranulocytosis secondary to cancer chemotherapy: Secondary | ICD-10-CM

## 2023-06-02 DIAGNOSIS — D696 Thrombocytopenia, unspecified: Secondary | ICD-10-CM

## 2023-06-02 NOTE — Assessment & Plan Note (Signed)
endorses LUTS; recommend PSA in place of DRE. If PSA is elevated for age, we will repeat; if PSA remains elevated pt will be referred to urology for DRE and next steps for best treatment.

## 2023-06-02 NOTE — Progress Notes (Signed)
New patient visit   Patient: Joshua [name]   DOB: May 23, 1956   67 y.o. Male  MRN: [id number] Visit Date: 06/02/2023  Today's healthcare provider: Jacky Kindle, FNP   Patient presents for new patient visit to establish care.  Introduced to Publishing rights manager role and practice setting.  All questions answered.  Discussed provider/patient relationship and expectations.   Chief Complaint  Patient presents with   New Patient (Initial Visit)   Subjective    Joshua [name] is a 67 y.o. male who presents today as a new patient to establish care.   HPI HPI   Re-establish Pt requesting Blood work Last edited by Shelly Bombard, CMA on 06/02/2023 10:23 AM.      Past Medical History:  Diagnosis Date   Allergy    Cancer (HCC)    lung   COPD (chronic obstructive pulmonary disease) (HCC)    Dyspnea    former smoker. only doe   History of kidney stones    Hypertension    Peripheral vascular disease (HCC)    Pre-diabetes    Sleep apnea    Past Surgical History:  Procedure Laterality Date   ANTERIOR CERVICAL DECOMP/DISCECTOMY FUSION N/A 03/14/2020   Procedure: ANTERIOR CERVICAL DECOMPRESSION/DISCECTOMY FUSION 3 LEVELS C4-7;  Surgeon: Venetia Night, MD;  Location: [facility] ORS;  Service: Neurosurgery;  Laterality: N/A;   BACK SURGERY  2011   CENTRAL LINE INSERTION Right 09/10/2016   Procedure: CENTRAL LINE INSERTION;  Surgeon: Renford Dills, MD;  Location: [facility];  Service: Vascular;  Laterality: Right;   CERVICAL FUSION     C4-c7 on Mar 14, 2020   COLONOSCOPY  2013 ?   COLONOSCOPY N/A 09/09/2021   Procedure: COLONOSCOPY;  Surgeon: Jaynie Collins, DO;  Location: [facility] ENDOSCOPY;  Service: Gastroenterology;  Laterality: N/A;   CORONARY ANGIOPLASTY     ESOPHAGOGASTRODUODENOSCOPY N/A 09/09/2021   Procedure: ESOPHAGOGASTRODUODENOSCOPY (EGD);  Surgeon: Jaynie Collins, DO;  Location: [facility] ENDOSCOPY;  Service: Gastroenterology;  Laterality: N/A;    FEMORAL-POPLITEAL BYPASS GRAFT Right 09/10/2016   Procedure: BYPASS GRAFT FEMORAL-POPLITEAL ARTERY ( BELOW KNEE );  Surgeon: Renford Dills, MD;  Location: [facility];  Service: Vascular;  Laterality: Right;   FEMORAL-TIBIAL BYPASS GRAFT Right 03/23/2020   Procedure: BYPASS GRAFT RIGHT FEMORAL- DISTAL TIBIAL ARTERY WITH DISTAL FLOW GRAFT;  Surgeon: Renford Dills, MD;  Location: [facility];  Service: Vascular;  Laterality: Right;   LEFT HEART CATH AND CORONARY ANGIOGRAPHY Left 05/16/2021   Procedure: LEFT HEART CATH AND CORONARY ANGIOGRAPHY;  Surgeon: Armando Reichert, MD;  Location: [facility] INVASIVE CV LAB;  Service: Cardiovascular;  Laterality: Left;   LOWER EXTREMITY ANGIOGRAPHY Right 11/18/2016   Procedure: Lower Extremity Angiography;  Surgeon: Renford Dills, MD;  Location: [facility] INVASIVE CV LAB;  Service: Cardiovascular;  Laterality: Right;   LOWER EXTREMITY ANGIOGRAPHY Right 12/09/2016   Procedure: Lower Extremity Angiography;  Surgeon: Renford Dills, MD;  Location: [facility] INVASIVE CV LAB;  Service: Cardiovascular;  Laterality: Right;   LOWER EXTREMITY ANGIOGRAPHY Right 11/15/2019   Procedure: LOWER EXTREMITY ANGIOGRAPHY;  Surgeon: Renford Dills, MD;  Location: [facility] INVASIVE CV LAB;  Service: Cardiovascular;  Laterality: Right;   LOWER EXTREMITY ANGIOGRAPHY Right 12/01/2019   Procedure: Lower Extremity Angiography;  Surgeon: Annice Needy, MD;  Location: [facility] INVASIVE CV LAB;  Service: Cardiovascular;  Laterality: Right;   LOWER EXTREMITY ANGIOGRAPHY Right 12/02/2019   Procedure: LOWER EXTREMITY ANGIOGRAPHY;  Surgeon: Renford Dills, MD;  Location:  [facility] INVASIVE CV LAB;  Service: Cardiovascular;  Laterality: Right;   LOWER EXTREMITY ANGIOGRAPHY Right 03/23/2020   Procedure: Lower Extremity Angiography;  Surgeon: Renford Dills, MD;  Location: [facility] INVASIVE CV LAB;  Service: Cardiovascular;  Laterality: Right;   LOWER EXTREMITY ANGIOGRAPHY Right 09/25/2021   Procedure: Lower  Extremity Angiography;  Surgeon: Renford Dills, MD;  Location: [facility] INVASIVE CV LAB;  Service: Cardiovascular;  Laterality: Right;   LOWER EXTREMITY ANGIOGRAPHY Right 09/26/2021   Procedure: Lower Extremity Angiography;  Surgeon: Annice Needy, MD;  Location: [facility] INVASIVE CV LAB;  Service: Cardiovascular;  Laterality: Right;   LOWER EXTREMITY ANGIOGRAPHY Right 04/08/2022   Procedure: Lower Extremity Angiography;  Surgeon: Renford Dills, MD;  Location: [facility] INVASIVE CV LAB;  Service: Cardiovascular;  Laterality: Right;   LOWER EXTREMITY INTERVENTION  11/18/2016   Procedure: Lower Extremity Intervention;  Surgeon: Renford Dills, MD;  Location: [facility] INVASIVE CV LAB;  Service: Cardiovascular;;   PERIPHERAL VASCULAR CATHETERIZATION Right 01/02/2015   Procedure: Lower Extremity Angiography;  Surgeon: Renford Dills, MD;  Location: [facility] INVASIVE CV LAB;  Service: Cardiovascular;  Laterality: Right;   PERIPHERAL VASCULAR CATHETERIZATION Right 06/18/2015   Procedure: Lower Extremity Angiography;  Surgeon: Annice Needy, MD;  Location: [facility] INVASIVE CV LAB;  Service: Cardiovascular;  Laterality: Right;   PERIPHERAL VASCULAR CATHETERIZATION  06/18/2015   Procedure: Lower Extremity Intervention;  Surgeon: Annice Needy, MD;  Location: [facility] INVASIVE CV LAB;  Service: Cardiovascular;;   PERIPHERAL VASCULAR CATHETERIZATION N/A 10/09/2015   Procedure: Abdominal Aortogram w/Lower Extremity;  Surgeon: Renford Dills, MD;  Location: [facility] INVASIVE CV LAB;  Service: Cardiovascular;  Laterality: N/A;   PERIPHERAL VASCULAR CATHETERIZATION  10/09/2015   Procedure: Lower Extremity Intervention;  Surgeon: Renford Dills, MD;  Location: [facility] INVAS[facility] LAB;  Service: Cardiovascular;;   PERIPHERAL VASCULAR CATHETERIZATION Right 10/10/2015   Procedure: Lower Extremity Angiography;  Surgeon: Annice Needy, MD;  Location: [facility] INVAS[facility] LAB;  Service: Cardiovascular;  Laterality: Right;   PERIPHERAL VASCULAR  CATHETERIZATION Right 10/10/2015   Procedure: Lower Extremity Intervention;  Surgeon: Annice Needy, MD;  Location: [facility] INVAS[facility] LAB;  Service: Cardiovascular;  Laterality: Right;   PERIPHERAL VASCULAR CATHETERIZATION Right 12/03/2015   Procedure: Lower Extremity Angiography;  Surgeon: Annice Needy, MD;  Location: [facility];  Service: Cardiovascular;  Laterality: Right;   PERIPHERAL VASCULAR CATHETERIZATION Right 12/04/2015   Procedure: Lower Extremity Angiography;  Surgeon: Annice Needy, MD;  Location: [facility] IN[facility];  Service: Cardiovascular;  Laterality: Right;   PERIPHERAL VASCULAR CATHETERIZATION  12/04/2015   Procedure: Lower Extremity Intervention;  Surgeon: Annice Needy, MD;  Location: [facility] INV[facility] LAB;  Service: Cardiovascular;;   PERIPHERAL VASCULAR CATHETERIZATION Right 06/30/2016   Procedure: Lower Extremity Angiography;  Surgeon: Annice Needy, MD;  Location: [facility] INVAS[facility];  Service: Cardiovascular;  Laterality: Right;   PERIPHERAL VASCULAR CATHETERIZATION Right 06/25/2016   Procedure: Lower Extremity Angiography;  Surgeon: Renford Dills, MD;  Location: [facility] INVAS[facility] LAB;  Service: Cardiovascular;  Laterality: Right;   PERIPHERAL VASCULAR CATHETERIZATION Right 07/01/2016   Procedure: Lower Extremity Angiography;  Surgeon: Renford Dills, MD;  Location: [facility] INVAS[facility] LAB;  Service: Cardiovascular;  Laterality: Right;   PERIPHERAL VASCULAR CATHETERIZATION  07/01/2016   Procedure: Lower Extremity Intervention;  Surgeon: Renford Dills, MD;  Location: [facility] INVASIVE CV LAB;  Service: Cardiovascular;;   PERIPHERAL VASCULAR CATHETERIZATION Right 08/01/2016   Procedure: Lower Extremity Angiography;  Surgeon: Renford Dills, MD;  Location: [facility] INVASIVE CV LAB;  Service: Cardiovascular;  Laterality: Right;   PORTA CATH INSERTION N/A 10/05/2020   Procedure: PORTA CATH INSERTION;  Surgeon: Renford Dills, MD;  Location: [facility] INVASIVE CV LAB;  Service:  Cardiovascular;  Laterality: N/A;   stent placement in right leg Right    VIDEO BRONCHOSCOPY N/A 09/14/2020   Procedure: VIDEO BRONCHOSCOPY WITH FLUORO;  Surgeon: Salena Saner, MD;  Location: [facility];  Service: Cardiopulmonary;  Laterality: N/A;   VIDEO BRONCHOSCOPY WITH ENDOBRONCHIAL ULTRASOUND N/A 09/14/2020   Procedure: VIDEO BRONCHOSCOPY WITH ENDOBRONCHIAL ULTRASOUND;  Surgeon: Salena Saner, MD;  Location: [facility];  Service: Cardiopulmonary;  Laterality: N/A;   Family Status  Relation Name Status   Mother  Deceased   Father  Deceased   MGM  Deceased   Neg Hx  (Not Specified)  No partnership data on file   Family History  Problem Relation Age of Onset   Diabetes Mother    Hypertension Mother    Heart murmur Mother    Leukemia Mother    Throat cancer Maternal Grandmother    Colon cancer Neg Hx    Breast cancer Neg Hx    Social History   Socioeconomic History   Marital status: Single    Spouse name: Not on file   Number of children: Not on file   Years of education: Not on file   Highest education level: Not on file  Occupational History   Occupation: unemployed  Tobacco Use   Smoking status: Former    Current packs/day: 0.00    Average packs/day: 2.0 packs/day for 38.0 years (76.0 ttl pk-yrs)    Types: Cigarettes    Start date: 12/19/1976    Quit date: 12/20/2014    Years since quitting: 8.4   Smokeless tobacco: Never   Tobacco comments:    quit smoking in 2017 most smoked 2   Vaping Use   Vaping status: Former  Substance and Sexual Activity   Alcohol use: Not Currently   Drug use: No   Sexual activity: Yes  Other Topics Concern   Not on file  Social History Narrative   Not on file   Social Determinants of Health   Financial Resource Strain: Patient Declined (05/29/2023)   Overall Financial Resource Strain (CARDIA)    Difficulty of Paying Living Expenses: Patient declined  Food Insecurity: Patient Declined (05/29/2023)   Hunger Vital Sign     Worried About Running Out of Food in the Last Year: Patient declined    Ran Out of Food in the Last Year: Patient declined  Transportation Needs: No Transportation Needs (05/29/2023)   PRAPARE - Administrator, Civil Service (Medical): No    Lack of Transportation (Non-Medical): No  Physical Activity: Insufficiently Active (05/29/2023)   Exercise Vital Sign    Days of Exercise per Week: 2 days    Minutes of Exercise per Session: 10 min  Stress: No Stress Concern Present (05/29/2023)   [employer] of Occupational Health - Occupational Stress Questionnaire    Feeling of Stress : Not at all  Social Connections: Unknown (05/29/2023)   Social Connection and Isolation Panel [NHANES]    Frequency of Communication with Friends and Family: More than three times a week    Frequency of Social Gatherings with Friends and Family: Three times a week    Attends Religious Services: Patient declined    Active Member of Clubs or Organizations: No  Attends Banker Meetings: Not on file    Marital Status: Patient declined   Outpatient Medications Prior to Visit  Medication Sig   aspirin EC 81 MG tablet Take 81 mg by mouth daily. Swallow whole.   clopidogrel (PLAVIX) 75 MG tablet TAKE 1 TABLET BY MOUTH DAILY   levothyroxine (SYNTHROID) 100 MCG tablet Take 1 tablet (100 mcg total) by mouth daily before breakfast.   levothyroxine (SYNTHROID) 50 MCG tablet Take 0.5 tablets (25 mcg total) by mouth daily before breakfast. Take with his levothyroxine.   potassium chloride (KLOR-CON M) 10 MEQ tablet Take 1 tablet (10 mEq total) by mouth 2 (two) times daily.   simvastatin (ZOCOR) 40 MG tablet Take 40 mg by mouth at bedtime.   Facility-Administered Medications Prior to Visit  Medication Dose Route Frequency Provider   albuterol (PROVENTIL) (2.5 MG/3ML) 0.083% nebulizer solution 2.5 mg  2.5 mg Nebulization Once Salena Saner, MD   No Known Allergies  Immunization History   Administered Date(s) Administered   Influenza,inj,Quad PF,6+ Mos 09/14/2016, 04/13/2019, 04/16/2020   Influenza-Unspecified 09/14/2016, 04/13/2019, 04/16/2020, 04/02/2022   PFIZER(Purple Top)SARS-COV-2 Vaccination 10/20/2019, 11/29/2019   Tdap 04/16/2020    Health Maintenance  Topic Date Due   Zoster Vaccines- Shingrix (1 of 2) Never done   COVID-19 Vaccine (3 - Pfizer risk series) 12/27/2019   Medicare Annual Wellness (AWV)  10/13/2020   DTaP/Tdap/Td (2 - Td or Tdap) 04/16/2030   Colonoscopy  09/10/2031   INFLUENZA VACCINE  Completed   Hepatitis C Screening  Completed   HIV Screening  Completed   HPV VACCINES  Aged Out    Patient Care Team: Jacky Kindle, FNP as PCP - General (Family Medicine) Patrice Paradise, MD as Referring Physician (Physician Assistant) Lemar Livings, Merrily Pew, MD (General Surgery) Glory Buff, RN as Oncology Nurse Navigator [name], MD as Consulting Physician (Oncology)  Review of Systems  Last CBC Lab Results  Component Value Date   WBC 6.8 05/28/2023   HGB 12.5 (L) 05/28/2023   HCT 38.2 (L) 05/28/2023   MCV 82.2 05/28/2023   MCH 26.9 05/28/2023   RDW 14.5 05/28/2023   PLT 228 05/28/2023   Last metabolic panel Lab Results  Component Value Date   GLUCOSE 108 (H) 05/28/2023   NA 140 05/28/2023   K 3.5 05/28/2023   CL 107 05/28/2023   CO2 25 05/28/2023   BUN 21 05/28/2023   CREATININE 1.52 (H) 05/28/2023   GFRNONAA 51 (L) 05/28/2023   CALCIUM 8.6 (L) 05/28/2023   PHOS 4.1 09/27/2021   PROT 6.6 05/28/2023   ALBUMIN 3.7 05/28/2023   LABGLOB 3.2 04/13/2019   AGRATIO 1.3 04/13/2019   BILITOT 0.3 05/28/2023   ALKPHOS 75 05/28/2023   AST 20 05/28/2023   ALT 20 05/28/2023   ANIONGAP 8 05/28/2023   Last lipids Lab Results  Component Value Date   CHOL 130 11/21/2020   HDL 45 11/21/2020   LDLCALC 64 11/21/2020   TRIG 106 11/21/2020   CHOLHDL 2.9 11/21/2020   Last hemoglobin A1c Lab Results  Component Value Date   HGBA1C 5.5  10/08/2021   Last thyroid functions Lab Results  Component Value Date   TSH 3.378 04/16/2023   T4TOTAL 10.5 04/16/2023   Last vitamin D No results found for: "25OHVITD2", "25OHVITD3", "VD25OH" Last vitamin B12 and Folate Lab Results  Component Value Date   VITAMINB12 370 10/14/2019    Objective    BP (!) 140/80 (BP Location: Left Arm, Patient Position:  Sitting, Cuff Size: Normal)   Ht 5\' 11"  (1.803 m)   Wt 216 lb (98 kg)   SpO2 99%   BMI 30.13 kg/m   BP Readings from Last 3 Encounters:  06/02/23 (!) 140/80  05/28/23 (!) 150/73  05/14/23 128/70   Wt Readings from Last 3 Encounters:  06/02/23 216 lb (98 kg)  05/28/23 220 lb 4.8 oz (99.9 kg)  05/14/23 218 lb 4.8 oz (99 kg)       Physical Exam Vitals and nursing note reviewed.  Constitutional:      Appearance: Normal appearance. He is obese.  HENT:     Head: Normocephalic and atraumatic.  Cardiovascular:     Rate and Rhythm: Normal rate and regular rhythm.     Pulses: Normal pulses.     Heart sounds: Normal heart sounds.  Pulmonary:     Effort: Pulmonary effort is normal.     Breath sounds: Normal breath sounds.  Musculoskeletal:        General: Normal range of motion.     Cervical back: Normal range of motion.  Skin:    General: Skin is warm and dry.     Capillary Refill: Capillary refill takes less than 2 seconds.  Neurological:     Mental Status: He is alert and oriented to person, place, and time. Mental status is at baseline.     Motor: Weakness present.     Comments: Use of cane  Psychiatric:        Mood and Affect: Mood normal.        Behavior: Behavior normal.        Thought Content: Thought content normal.        Judgment: Judgment normal.    Depression Screen    06/02/2023   10:20 AM 04/16/2020    1:33 PM 10/14/2019   10:24 AM 04/13/2019    8:58 AM  PHQ 2/9 Scores  PHQ - 2 Score 0 1 2 0  PHQ- 9 Score 3 7 9 6    No results found for any visits on 06/02/23.  Assessment & Plan       Problem List Items Addressed This Visit       Endocrine   Hypothyroidism, unspecified    Chronic, variable per pt reports Currently on 125 mcg synthroid Reports variable mood states; unsure if it has a relation Repeat labs        Genitourinary   Benign localized prostatic hyperplasia without lower urinary tract symptoms (LUTS)    endorses LUTS; recommend PSA in place of DRE. If PSA is elevated for age, we will repeat; if PSA remains elevated pt will be referred to urology for DRE and next steps for best treatment.       Relevant Orders   PSA     Other   Avitaminosis D    Chronic, untreated Repeat labs      Relevant Orders   Vitamin D (25 hydroxy)   Elevated blood pressure reading in office without diagnosis of hypertension    Acute, chronic? Recommend additional labs; pt reports previous HYPOtension  Continue to monitor The 10-year ASCVD risk score (Arnett DK, et al., 2019) is: 10.7%       Relevant Orders   TSH   Basic Metabolic Panel (BMET)   Prediabetes - Primary    Chronic, unknown Repeat A1c Continue to recommend balanced, lower carb meals. Smaller meal size, adding snacks. Choosing water as drink of choice and increasing purposeful exercise.  Relevant Orders   Hemoglobin A1c   Return in about 3 months (around 09/02/2023) for annual examination.    Leilani Merl, FNP, have reviewed all documentation for this visit. The documentation on 06/02/23 for the exam, diagnosis, procedures, and orders are all accurate and complete.  Jacky Kindle, FNP  [facility] [phone] (phone) [phone] (fax)  [facility]

## 2023-06-02 NOTE — Assessment & Plan Note (Signed)
Chronic, untreated Repeat labs

## 2023-06-02 NOTE — Assessment & Plan Note (Signed)
Chronic, unknown Repeat A1c Continue to recommend balanced, lower carb meals. Smaller meal size, adding snacks. Choosing water as drink of choice and increasing purposeful exercise.  

## 2023-06-02 NOTE — Assessment & Plan Note (Signed)
Chronic, variable per pt reports Currently on 125 mcg synthroid Reports variable mood states; unsure if it has a relation Repeat labs

## 2023-06-02 NOTE — Assessment & Plan Note (Signed)
Acute, chronic? Recommend additional labs; pt reports previous HYPOtension  Continue to monitor The 10-year ASCVD risk score (Arnett DK, et al., 2019) is: 10.7%

## 2023-06-03 ENCOUNTER — Inpatient Hospital Stay (HOSPITAL_BASED_OUTPATIENT_CLINIC_OR_DEPARTMENT_OTHER): Payer: Medicare Other | Admitting: Oncology

## 2023-06-03 ENCOUNTER — Encounter: Payer: Self-pay | Admitting: Oncology

## 2023-06-03 ENCOUNTER — Inpatient Hospital Stay: Payer: Medicare Other

## 2023-06-03 ENCOUNTER — Other Ambulatory Visit: Payer: Self-pay | Admitting: Family Medicine

## 2023-06-03 VITALS — BP 155/88 | HR 81 | Temp 96.7°F | Resp 18 | Wt 216.2 lb

## 2023-06-03 DIAGNOSIS — E032 Hypothyroidism due to medicaments and other exogenous substances: Secondary | ICD-10-CM | POA: Diagnosis not present

## 2023-06-03 DIAGNOSIS — C3491 Malignant neoplasm of unspecified part of right bronchus or lung: Secondary | ICD-10-CM

## 2023-06-03 DIAGNOSIS — Z5112 Encounter for antineoplastic immunotherapy: Secondary | ICD-10-CM

## 2023-06-03 DIAGNOSIS — C7931 Secondary malignant neoplasm of brain: Secondary | ICD-10-CM | POA: Diagnosis not present

## 2023-06-03 DIAGNOSIS — R7989 Other specified abnormal findings of blood chemistry: Secondary | ICD-10-CM

## 2023-06-03 LAB — COMPREHENSIVE METABOLIC PANEL
ALT: 22 U/L (ref 0–44)
AST: 19 U/L (ref 15–41)
Albumin: 3.9 g/dL (ref 3.5–5.0)
Alkaline Phosphatase: 75 U/L (ref 38–126)
Anion gap: 9 (ref 5–15)
BUN: 25 mg/dL — ABNORMAL HIGH (ref 8–23)
CO2: 24 mmol/L (ref 22–32)
Calcium: 9 mg/dL (ref 8.9–10.3)
Chloride: 102 mmol/L (ref 98–111)
Creatinine, Ser: 1.28 mg/dL — ABNORMAL HIGH (ref 0.61–1.24)
GFR, Estimated: >60 mL/min (ref >60)
Glucose, Bld: 100 mg/dL — ABNORMAL HIGH (ref 70–99)
Potassium: 3.9 mmol/L (ref 3.5–5.1)
Sodium: 135 mmol/L (ref 135–145)
Total Bilirubin: 0.6 mg/dL (ref <1.2)
Total Protein: 7 g/dL (ref 6.5–8.1)

## 2023-06-03 LAB — CBC WITH DIFFERENTIAL/PLATELET
Abs Immature Granulocytes: 0.01 10*3/uL (ref 0.00–0.07)
Basophils Absolute: 0.1 10*3/uL (ref 0.0–0.1)
Basophils Relative: 1 %
Eosinophils Absolute: 0.1 10*3/uL (ref 0.0–0.5)
Eosinophils Relative: 2 %
HCT: 41.2 % (ref 39.0–52.0)
Hemoglobin: 13.1 g/dL (ref 13.0–17.0)
Immature Granulocytes: 0 %
Lymphocytes Relative: 21 %
Lymphs Abs: 1.6 10*3/uL (ref 0.7–4.0)
MCH: 26.3 pg (ref 26.0–34.0)
MCHC: 31.8 g/dL (ref 30.0–36.0)
MCV: 82.6 fL (ref 80.0–100.0)
Monocytes Absolute: 0.8 10*3/uL (ref 0.1–1.0)
Monocytes Relative: 11 %
Neutro Abs: 5 10*3/uL (ref 1.7–7.7)
Neutrophils Relative %: 65 %
Platelets: 217 10*3/uL (ref 150–400)
RBC: 4.99 MIL/uL (ref 4.22–5.81)
RDW: 14.5 % (ref 11.5–15.5)
WBC: 7.7 10*3/uL (ref 4.0–10.5)
nRBC: 0 % (ref 0.0–0.2)

## 2023-06-03 LAB — HEMOGLOBIN A1C
Est. average glucose Bld gHb Est-mCnc: 134 mg/dL
Hgb A1c MFr Bld: 6.3 % — ABNORMAL HIGH (ref 4.8–5.6)

## 2023-06-03 LAB — PSA: Prostate Specific Ag, Serum: 1.1 ng/mL (ref 0.0–4.0)

## 2023-06-03 LAB — BASIC METABOLIC PANEL
BUN/Creatinine Ratio: 18 (ref 10–24)
BUN: 18 mg/dL (ref 8–27)
CO2: 23 mmol/L (ref 20–29)
Calcium: 9.5 mg/dL (ref 8.6–10.2)
Chloride: 100 mmol/L (ref 96–106)
Creatinine, Ser: 1.01 mg/dL (ref 0.76–1.27)
Glucose: 86 mg/dL (ref 70–99)
Potassium: 4.5 mmol/L (ref 3.5–5.2)
Sodium: 138 mmol/L (ref 134–144)
eGFR: 83 mL/min/{1.73_m2} (ref >59)

## 2023-06-03 LAB — VITAMIN D 25 HYDROXY (VIT D DEFICIENCY, FRACTURES): Vit D, 25-Hydroxy: 15.9 ng/mL — ABNORMAL LOW (ref 30.0–100.0)

## 2023-06-03 LAB — TSH: TSH: 3.4 u[IU]/mL (ref 0.450–4.500)

## 2023-06-03 MED ORDER — SODIUM CHLORIDE 0.9 % IV SOLN
Freq: Once | INTRAVENOUS | Status: AC
  Filled 2023-06-03: qty 250

## 2023-06-03 MED ORDER — LEVOTHYROXINE SODIUM 125 MCG PO TABS: 125.0000 ug | ORAL_TABLET | Freq: Every day | ORAL | 0 refills | Status: DC

## 2023-06-03 MED ORDER — METFORMIN HCL ER 750 MG PO TB24: 750.0000 mg | ORAL_TABLET | Freq: Every day | ORAL | 3 refills | Status: DC

## 2023-06-03 MED ORDER — VITAMIN D (ERGOCALCIFEROL) 1.25 MG (50000 UNIT) PO CAPS: 50000.0000 [IU] | ORAL_CAPSULE | ORAL | 3 refills | Status: DC

## 2023-06-03 MED ORDER — SODIUM CHLORIDE 0.9 % IV SOLN
10.0000 mg/kg | Freq: Once | INTRAVENOUS | Status: AC
  Administered 2023-06-03: 1000 mg via INTRAVENOUS
  Filled 2023-06-03: qty 20

## 2023-06-03 MED ORDER — HEPARIN SOD (PORK) LOCK FLUSH 100 UNIT/ML IV SOLN
500.0000 [IU] | Freq: Once | INTRAVENOUS | Status: AC | PRN
  Filled 2023-06-03: qty 5

## 2023-06-03 MED ORDER — ROSUVASTATIN CALCIUM 40 MG PO TABS: 40.0000 mg | ORAL_TABLET | Freq: Every day | ORAL | 3 refills | Status: DC

## 2023-06-03 MED ORDER — LISINOPRIL 20 MG PO TABS: 20.0000 mg | ORAL_TABLET | Freq: Every day | ORAL | 3 refills | Status: DC

## 2023-06-03 NOTE — Progress Notes (Signed)
Recommend daily Vit D at 5000 IU or 50,000 IU once weekly. Recommend start of Metformin at 750 XR daily for prediabetes. TSH and creatinine are normal/stabilized.

## 2023-06-03 NOTE — Assessment & Plan Note (Signed)
Status post radiation,  02/19/22 MRI showed decreased brain lesion size and edema.  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 Mitzi Hansen.  Follow up with Dr. Barbaraann Cao.

## 2023-06-03 NOTE — Assessment & Plan Note (Signed)
 continue levothyroxine 125 mcg daily

## 2023-06-03 NOTE — Assessment & Plan Note (Signed)
 Likely due to recent NSAID use.  Recommend patient to avoid nephrotoxins encourage hydration

## 2023-06-03 NOTE — Assessment & Plan Note (Signed)
Durvalumab treatment as planned.  

## 2023-06-03 NOTE — Assessment & Plan Note (Addendum)
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  May 2024 CT scan showed Improving bilateral scattered interstitial and ground-glass opacities. Discussed with pulmonology Dr. Jayme Cloud. 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  Labs are reviewed and discussed with patient. Proceed with Durvalumab.

## 2023-06-03 NOTE — Progress Notes (Signed)
Hematology/Oncology Progress note [phone] [phone]    CHIEF COMPLAINTS/REASON FOR VISIT:  Follow up for lung cancer   ASSESSMENT & PLAN:   Cancer Staging  Squamous cell carcinoma of right lung (HCC) Staging form: Lung, AJCC 8th Edition - Clinical stage from 09/20/2020: Stage IV (cT1b, cN2, cM1) - Signed by [name], MD on 12/13/2021   Squamous cell carcinoma of right lung (HCC) Durvalumab was previously held due to pneumonitis.  May 2024 CT scan showed Improving bilateral scattered interstitial and ground-glass opacities. Discussed with pulmonology Dr. Jayme Cloud. 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  Labs are reviewed and discussed with patient. Proceed with  Durvalumab.    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   Malignant neoplasm metastatic to brain [facility]) Status post radiation,  02/19/22 MRI showed decreased brain lesion size and edema.  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 Mitzi Hansen.  Follow up with Dr. Barbaraann Cao.    Elevated serum creatinine Likely due to recent NSAID use.  Recommend patient to avoid nephrotoxins encourage hydration    No orders of the defined types were placed in this encounter.   Follow up 1w lab MD Durvalumab All questions were answered. The patient knows to call the clinic with any problems, questions or concerns.  [name], MD, PhD [facility] Hematology Oncology 06/03/2023       HISTORY OF PRESENTING ILLNESS:  Joshua [name] is a 67 y.o. male who has above history reviewed by me today presents for follow up visit for Stage IV lung squamous cell carcinoma. Oncology History  Squamous cell carcinoma of right lung (HCC)  09/18/2020 Imaging   PET scan showed right upper lobe nodule maximal SUV 11.5.  Hypermetabolic cluster right paratracheal adenopathy with maximum SUV up to 12.  No findings of metastatic disease to the neck/abdomen/pelvis or skeleton.   09/20/2020 Initial Diagnosis   Squamous cell carcinoma of right lung (HCC) NGS showed PD-L1 TPS 95%, LRP1B S946fs, PIK3CA E545K, RB1 c1422-2A>T, RIT1 T83_ A84del, STK11 P265fs, TPS53 R248W, TMB 19.82mut/mb, MS stable.    09/20/2020 Cancer Staging   Staging form: Lung, AJCC 8th Edition - Clinical stage from 09/20/2020: Stage IV (cT1b, cN2, cM1) - Signed by [name], MD on 12/13/2021   10/03/2020 Imaging   MRI brain with and without contrast showed no evidence of intracranial metastatic disease. Well-defined round lesion within the left parotid tail measuring approximately 2.3 x 1.8 x 1.8 cm   10/09/2020 - 11/27/2020 Radiation Therapy   concurrent chemoradiation   10/10/2020 - 10/31/2020 Chemotherapy   10/10/2020 concurrent chemoradiation.  carboplatin AUC of 2 and Taxol 45 mg/m2 x 4.  Additional chemotherapy was held due to thrombocytopenia, neutropenia       12/31/2020 - 03/21/2022 Chemotherapy   Maintenance  Durvalumab q14d      12/31/2020 -  Chemotherapy   Patient is on Treatment Plan : LUNG Durvalumab (10) q14d     03/28/2021 Imaging   CT without contrast showed new linear right perihilar consolidation with associated traction bronchiectasis.  Likely postradiation changes.  Additional new peribronchial vascular/nodular groundglass opacities with  areas of consolidation or seen primarily in the right upper and lower lobes, more distant from expected radiation fields. 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  Per my discussion with pulmonology Dr. 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     05/09/2021 Imaging   Repeat CT chest without contrast showed evolving radiation changes in the right perihilar region likely accounting for interval increase to thickening of the posterior wall of the right upper lobe bronchus.  The more peripheral right lung groundglass opacities seen on most recent study have generally improved with exception of one peripherally right upper lobe which appears new.  These are likely inflammatory.  No findings highly suspicious for local recurrence.   05/30/2021 - 01/10/2022 Chemotherapy   Durvalumab q14d     10/07/2021 Imaging   MRI brain with and without contrast showed 2 contrast-enhancing lesions, most consistent with metastatic disease.  Largest lesion is in the left frontal lobe and measures up to 1.3 cm with a large amount of surrounding edema and 9 mm rightward midline shift.  The other enhancing lesion was 4 mm in the right occipital lobe with a small amount of surrounding edema.  No acute or chronic hemorrhage.   10/07/2021 Progression   Headache after colonoscopy, progressive difficulty using her right arm.  Progressively worsening of speech. Stage IV lung cancer with brain metastasis.  started on Dexamethasone.  He was seen by neurosurgery and radiation oncology.  Recommendation is to proceed with SRS.  Patient was discharged with dexamethasone   10/16/2021 Imaging   PET scan showed postradiation changes in the right supra hilar lung without evidence of local recurrence.  No evidence of lung cancer recurrence or metastatic disease.  Single focus of intense metabolic activity associated with posterior left 11th rib likely secondary to recent fall/posttraumatic metabolic changes.  Frontal  uptake in the left frontal lobe corresponding to the known metastatic lesion in the brain   10/29/2021 -   Radiation Therapy   Brain Radiation [facility]   12/10/2021 Imaging   Brain MRI: Continued interval decrease in size of a left frontal lobe metastasis, now measuring 10 mm. Moderate surrounding edema has also decreased.  3-4 mm metastasis within the right occipital lobe, not significantly changed in size. There is now a small focus of central non enhancement within this lesion. Increased edema. 3 mm enhancing metastasis within the high left parietal lobe. In retrospect, this was likely present as a subtle punctate focus of enhancement on the prior brain MRI of 10/22/2021.   01/18/2022 Imaging   CT chest abdomen pelvis w contrast 1. Stable post treatment changes in the right upper lobe medially with dense radiation fibrosis but no findings to suggest local recurrent tumor. 2. Significant progression of ill-defined ground-glass opacity, interstitial thickening and airspace nodularity in the right upper lobe and upper aspect of the right lower lobe. Findings could reflect an inflammatory or atypical infectious process, drug induced pneumonitis or interstitial spread of tumor.3. No mediastinal or hilar mass or adenopathy. 4. No findings for abdominal/pelvic metastatic disease or osseous metastatic disease.   02/19/2022 Imaging   MRI brain w wo contrast showed Three metastatic deposits in the brain appear smaller. Decreased edema in the left parietal lobe. No new lesions    04/04/2022 Imaging   CT chest w contrast 1. Stable radiation changes in the right perihilar region without evidence of local recurrence. 2. Interval  improvement in the patchy ground-glass opacities previously demonstrated in both lungs, most consistent with a resolving infectious or inflammatory process (including immunotherapy-related pneumonitis). 3. No evidence of metastatic disease.   05/14/2022 Miscellaneous   He had a mechanical fall and fractured his left elbow. xrays showed a left elbow fracture  05/26/22 seen by orthopedic  surgeon, cast applied.    05/27/2022 Imaging   MRI brain w wo contrast 1. All three known brain metastases have enlarged since August, with increasing regional edema. And there is a small new left anterior cerebellar 4-5 mm metastasis (was questionably punctate on the May MRI). This constellation could reflect a combination of True Progression (Left Cerebellar lesion) and pseudoprogression/radionecrosis (3 existing mets).    2. No significant intracranial mass effect. 3. Underlying moderate cerebral white matter signal changes.   05/30/2022 Imaging   CT chest abdomen pelvis w contrast  1. 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 There is a stable small right pleural effusion, but no definitive findings to suggest residual or recurrent disease on today's examination. 2. No signs of metastatic disease in the abdomen or pelvis.  3. Aortic atherosclerosis, in addition to two-vessel coronary artery disease. Please note that although the presence of coronary artery calcium documents the presence of coronary artery disease, the severity of this disease and any potential stenosis cannot be assessed on this non-gated CT examination. Assessment for potential risk factor modification, dietary therapy or pharmacologic therapy may be warranted, if clinically indicated. 4. Hepatic steatosis. 5. Colonic diverticulosis without evidence of acute diverticulitis at this time. 6. Incompletely imaged but apparently occluded vascular graft in the superior aspect of the right anterior thigh. Referral to vascular specialist for further clinical evaluation should be considered if clinically appropriate. 7. Additional incidental findings, as above.   07/22/2022 Imaging   MRI brain  1. No new or enlarging lesions. No intracranial disease progression. 2. Decreased size of treated lesions of the left  frontoparietal junction and left postcentral gyrus. 3. Unchanged lesions in the left cerebellum and right occipital lobe.   10/15/2022 Imaging   MRI brain w wo contrast showed Interval increase in size of multiple intraparenchymal lesions with worsened surrounding vasogenic edema. 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 No new lesions   11/05/2022 Imaging   MRI brain w wo contrast  1. Likely slight increase and right occipital and left cerebellar lesions. Otherwise, unchanged metastases, as above. No new lesions identified. 2. Partially imaged large probable lymph node in the left upper neck, apparent on prior PET CT.   11/13/2022 -  Radiation Therapy   Each of the sites were treated with SRS/SBRT/SRT-IMRT technique to 20 Gy in 1 fraction to each of the following sites: PTV_2_LCerebel_13mm PTV_3_RCerebel_58mm PTV_4_ROccip_20mm PTV_5_LParietal_31mm   11/27/2022 Imaging   CT chest abdomen pelvis w contrast showed  Improving bilateral scattered interstitial and ground-glass opacities. Small amount of residual areas in the extreme left lower lobe inferiorly. This specific area is slightly increased from previous but overall the amount of opacities is significantly improved. Recommend continued follow-up. No developing new mass lesion, fluid collection or lymph node enlargement. Stable posttreatment changes right perihilar in the lung. Fatty liver infiltration. Colonic diverticulosis. Improved tiny right pleural effusion   02/05/2023 Imaging   MRI brain with and without contrast Interval decrease in all of the metastatic lesions, detailed above.No new lesions identified.  Imaging   CT chest abdomen pelvis w contrast showed  1. 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 2. Trace right pleural fluid or thickening, new or minimally increased. 3.  Incidental findings, including: Aortic atherosclerosis (ICD10-I70.0), coronary artery atherosclerosis and emphysema (ICD10-J43.9). Hepatic steatosis.    Today he feels well.Intermittent headache, is better. denies any focal deficits. chronic SOB,not worse.  He denies any new complaint.    Review of Systems  Constitutional:  Positive for fatigue. Negative for appetite change, chills, fever and unexpected weight change.  HENT:   Negative for hearing loss and voice change.   Eyes:  Negative for eye problems and icterus.  Respiratory:  Negative for chest tightness, cough and shortness of breath.   Cardiovascular:  Negative for chest pain and leg swelling.  Gastrointestinal:  Negative for abdominal distention and abdominal pain.  Endocrine: Negative for hot flashes.  Genitourinary:  Negative for difficulty urinating, dysuria and frequency.   Musculoskeletal:  Negative for arthralgias.  Skin:  Negative for itching and rash.  Neurological:  Positive for extremity weakness. Negative for headaches, light-headedness, numbness and speech difficulty.  Hematological:  Negative for adenopathy. Does not bruise/bleed easily.  Psychiatric/Behavioral:  Negative for confusion.       MEDICAL HISTORY:  Past Medical History:  Diagnosis Date   Allergy    Cancer (HCC)    lung   COPD (chronic obstructive pulmonary disease) (HCC)    Dyspnea    former smoker. 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   Peripheral vascular disease (HCC)    Pre-diabetes    Sleep apnea     SURGICAL HISTORY: Past Surgical History:  Procedure Laterality Date   ANTERIOR CERVICAL DECOMP/DISCECTOMY FUSION N/A 03/14/2020   Procedure: ANTERIOR CERVICAL DECOMPRESSION/DISCECTOMY FUSION 3 LEVELS C4-7;  Surgeon: Venetia Night, MD;  Location: [facility] ORS;  Service: Neurosurgery;  Laterality: N/A;   BACK SURGERY  2011   CENTRAL LINE INSERTION Right 09/10/2016    Procedure: CENTRAL LINE INSERTION;  Surgeon: Renford Dills, MD;  Location: [facility] ORS;  Service: Vascular;  Laterality: Right;   CERVICAL FUSION     C4-c7 on Mar 14, 2020   COLONOSCOPY  2013 ?   COLONOSCOPY N/A 09/09/2021   Procedure: COLONOSCOPY;  Surgeon: Jaynie Collins, DO;  Location: [facility] ENDOSCOPY;  Service: Gastroenterology;  Laterality: N/A;   CORONARY ANGIOPLASTY     ESOPHAGOGASTRODUODENOSCOPY N/A 09/09/2021   Procedure: ESOPHAGOGASTRODUODENOSCOPY (EGD);  Surgeon: Jaynie Collins, DO;  Location[facility] ENDOSCOPY;  Service: Gastroenterology;  Laterality: N/A;   FEMORAL-POPLITEAL BYPASS GRAFT Right 09/10/2016   Procedure: BYPASS GRAFT FEMORAL-POPLITEAL ARTERY ( BELOW KNEE );  Surgeon: Renford Dills, MD;  Location: [facility] ORS;  Service: Vascular;  Laterality: Right;   FEMORAL-TIBIAL BYPASS GRAFT Right 03/23/2020   Procedure: BYPASS GRAFT RIGHT FEMORAL- DISTAL TIBIAL ARTERY WITH DISTAL FLOW GRAFT;  Surgeon: Renford Dills, MD;  Location: [facility] ORS;  Service: Vascular;  Laterality: Right;   LEFT HEART CATH AND CORONARY ANGIOGRAPHY Left 05/16/2021   Procedure: LEFT HEART CATH AND CORONARY ANGIOGRAPHY;  Surgeon: Armando Reichert, MD;  Location: [facility] INVASIVE CV LAB;  Service: Cardiovascular;  Laterality: Left;   LOWER EXTREMITY ANGIOGRAPHY Right 11/18/2016   Procedure: Lower Extremity Angiography;  Surgeon: Renford Dills, MD;  Location: [facility] INVASIVE CV LAB;  Service: Cardiovascular;  Laterality: Right;   LOWER EXTREMITY ANGIOGRAPHY Right 12/09/2016   Procedure:  Lower Extremity Angiography;  Surgeon: Renford Dills, MD;  Location: [facility] INVASIVE CV LAB;  Service: Cardiovascular;  Laterality: Right;   LOWER EXTREMITY ANGIOGRAPHY Right 11/15/2019   Procedure: LOWER EXTREMITY ANGIOGRAPHY;  Surgeon: Renford Dills, MD;  Location: [facility] INVASIVE CV LAB;  Service: Cardiovascular;  Laterality: Right;   LOWER EXTREMITY ANGIOGRAPHY Right 12/01/2019   Procedure: Lower Extremity  Angiography;  Surgeon: Annice Needy, MD;  Location: [facility] INVASIVE CV LAB;  Service: Cardiovascular;  Laterality: Right;   LOWER EXTREMITY ANGIOGRAPHY Right 12/02/2019   Procedure: LOWER EXTREMITY ANGIOGRAPHY;  Surgeon: Renford Dills, MD;  Location: [facility] INVASIVE CV LAB;  Service: Cardiovascular;  Laterality: Right;   LOWER EXTREMITY ANGIOGRAPHY Right 03/23/2020   Procedure: Lower Extremity Angiography;  Surgeon: Renford Dills, MD;  Location: [facility] INVASIVE CV LAB;  Service: Cardiovascular;  Laterality: Right;   LOWER EXTREMITY ANGIOGRAPHY Right 09/25/2021   Procedure: Lower Extremity Angiography;  Surgeon: Renford Dills, MD;  Location: [facility] INVASIVE CV LAB;  Service: Cardiovascular;  Laterality: Right;   LOWER EXTREMITY ANGIOGRAPHY Right 09/26/2021   Procedure: Lower Extremity Angiography;  Surgeon: Annice Needy, MD;  Location: [facility] INVASIVE CV LAB;  Service: Cardiovascular;  Laterality: Right;   LOWER EXTREMITY ANGIOGRAPHY Right 04/08/2022   Procedure: Lower Extremity Angiography;  Surgeon: Renford Dills, MD;  Location: [facility] INVASIVE CV LAB;  Service: Cardiovascular;  Laterality: Right;   LOWER EXTREMITY INTERVENTION  11/18/2016   Procedure: Lower Extremity Intervention;  Surgeon: Renford Dills, MD;  Location: [facility] INVASIVE CV LAB;  Service: Cardiovascular;;   PERIPHERAL VASCULAR CATHETERIZATION Right 01/02/2015   Procedure: Lower Extremity Angiography;  Surgeon: Renford Dills, MD;  Location: [facility] INVASIVE CV LAB;  Service: Cardiovascular;  Laterality: Right;   PERIPHERAL VASCULAR CATHETERIZATION Right 06/18/2015   Procedure: Lower Extremity Angiography;  Surgeon: Annice Needy, MD;  Location: [facility] INVASIVE CV LAB;  Service: Cardiovascular;  Laterality: Right;   PERIPHERAL VASCULAR CATHETERIZATION  06/18/2015   Procedure: Lower Extremity Intervention;  Surgeon: Annice Needy, MD;  Location: [facility] INVASIVE CV LAB;  Service: Cardiovascular;;   PERIPHERAL VASCULAR CATHETERIZATION N/A  10/09/2015   Procedure: Abdominal Aortogram w/Lower Extremity;  Surgeon: Renford Dills, MD;  Location: [facility] INVASIVE CV LAB;  Service: Cardiovascular;  Laterality: N/A;   PERIPHERAL VASCULAR CATHETERIZATION  10/09/2015   Procedure: Lower Extremity Intervention;  Surgeon: Renford Dills, MD;  Location: [facility] INVASIVE CV LAB;  Service: Cardiovascular;;   PERIPHERAL VASCULAR CATHETERIZATION Right 10/10/2015   Procedure: Lower Extremity Angiography;  Surgeon: Annice Needy, MD;  Location: [facility] INVASIVE CV LAB;  Service: Cardiovascular;  Laterality: Right;   PERIPHERAL VASCULAR CATHETERIZATION Right 10/10/2015   Procedure: Lower Extremity Intervention;  Surgeon: Annice Needy, MD;  Location: [facility] INV[facility] LAB;  Service: Cardiovascular;  Laterality: Right;   PERIPHERAL VASCULAR CATHETERIZATION Right 12/03/2015   Procedure: Lower Extremity Angiography;  Surgeon: Annice Needy, MD;  Location: [facility] INVAS[facility] CV LAB;  Service: Cardiovascular;  Laterality: Right;   PERIPHERAL VASCULAR CATHETERIZATION Right 12/04/2015   Procedure: Lower Extremity Angiography;  Surgeon: Annice Needy, MD;  Location: [facility] INV[facility] CV LAB;  Service: Cardiovascular;  Laterality: Right;   PERIPHERAL VASCULAR CATHETERIZATION  12/04/2015   Procedure: Lower Extremity Intervention;  Surgeon: Annice Needy, MD;  Location: [facility] INV[facility] LAB;  Service: Cardiovascular;;   PERIPHERAL VASCULAR CATHETERIZATION Right 06/30/2016   Procedure: Lower Extremity Angiography;  Surgeon: Annice Needy, MD;  Location: [facility] INV[facility] LAB;  Service: Cardiovascular;  Laterality:  Right;   PERIPHERAL VASCULAR CATHETERIZATION Right 06/25/2016   Procedure: Lower Extremity Angiography;  Surgeon: Renford Dills, MD;  Location: [facility] INV[facility] LAB;  Service: Cardiovascular;  Laterality: Right;   PERIPHERAL VASCULAR CATHETERIZATION Right 07/01/2016   Procedure: Lower Extremity Angiography;  Surgeon: Renford Dills, MD;  Location: [facility] INVAS[facility] CV LAB;  Service:  Cardiovascular;  Laterality: Right;   PERIPHERAL VASCULAR CATHETERIZATION  07/01/2016   Procedure: Lower Extremity Intervention;  Surgeon: Renford Dills, MD;  Location: [facility] INVASIVE CV LAB;  Service: Cardiovascular;;   PERIPHERAL VASCULAR CATHETERIZATION Right 08/01/2016   Procedure: Lower Extremity Angiography;  Surgeon: Renford Dills, MD;  Location: [facility] INVASIVE CV LAB;  Service: Cardiovascular;  Laterality: Right;   PORTA CATH INSERTION N/A 10/05/2020   Procedure: PORTA CATH INSERTION;  Surgeon: Renford Dills, MD;  Location: [facility] INVASIVE CV LAB;  Service: Cardiovascular;  Laterality: N/A;   stent placement in right leg Right    VIDEO BRONCHOSCOPY N/A 09/14/2020   Procedure: VIDEO BRONCHOSCOPY WITH FLUORO;  Surgeon: Salena Saner, MD;  Location: [facility] ORS;  Service: Cardiopulmonary;  Laterality: N/A;   VIDEO BRONCHOSCOPY WITH ENDOBRONCHIAL ULTRASOUND N/A 09/14/2020   Procedure: VIDEO BRONCHOSCOPY WITH ENDOBRONCHIAL ULTRASOUND;  Surgeon: Salena Saner, MD;  Location: [facility] ORS;  Service: Cardiopulmonary;  Laterality: N/A;    SOCIAL HISTORY: Social History   Socioeconomic History   Marital status: Single    Spouse name: Not on file   Number of children: Not on file   Years of education: Not on file   Highest education level: Not on file  Occupational History   Occupation: unemployed  Tobacco Use   Smoking status: Former    Current packs/day: 0.00    Average packs/day: 2.0 packs/day for 38.0 years (76.0 ttl pk-yrs)    Types: Cigarettes    Start date: 12/19/1976    Quit date: 12/20/2014    Years since quitting: 8.4   Smokeless tobacco: Never   Tobacco comments:    quit smoking in 2017 most smoked 2   Vaping Use   Vaping status: Former  Substance and Sexual Activity   Alcohol use: Not Currently   Drug use: No   Sexual activity: Yes  Other Topics Concern   Not on file  Social History Narrative   Not on file   Social Determinants of Health   Financial  Resource Strain: Patient Declined (05/29/2023)   Overall Financial Resource Strain (CARDIA)    Difficulty of Paying Living Expenses: Patient declined  Food Insecurity: Patient Declined (05/29/2023)   Hunger Vital Sign    Worried About Running Out of Food in the Last Year: Patient declined    Ran Out of Food in the Last Year: Patient declined  Transportation Needs: No Transportation Needs (05/29/2023)   PRAPARE - Administrator, Civil Service (Medical): No    Lack of Transportation (Non-Medical): No  Physical Activity: Insufficiently Active (05/29/2023)   Exercise Vital Sign    Days of Exercise per Week: 2 days    Minutes of Exercise per Session: 10 min  Stress: No Stress Concern Present (05/29/2023)   [employer] of Occupational Health - Occupational Stress Questionnaire    Feeling of Stress : Not at all  Social Connections: Unknown (05/29/2023)   Social Connection and Isolation Panel [NHANES]    Frequency of Communication with Friends and Family: More than three times a week    Frequency of Social Gatherings with Friends and Family: Three times a week  Attends Religious Services: Patient declined    Active Member of Clubs or Organizations: No    Attends Engineer, structural: Not on file    Marital Status: Patient declined  Intimate Partner Violence: Not on file    FAMILY HISTORY: Family History  Problem Relation Age of Onset   Diabetes Mother    Hypertension Mother    Heart murmur Mother    Leukemia Mother    Throat cancer Maternal Grandmother    Colon cancer Neg Hx    Breast cancer Neg Hx     ALLERGIES:  has No Known Allergies.  MEDICATIONS:  Current Outpatient Medications  Medication Sig Dispense Refill   aspirin EC 81 MG tablet Take 81 mg by mouth daily. Swallow whole.     clopidogrel (PLAVIX) 75 MG tablet TAKE 1 TABLET BY MOUTH DAILY 100 tablet 2   levothyroxine (SYNTHROID) 125 MCG tablet Take 1 tablet (125 mcg total) by mouth daily before  breakfast. 90 tablet 0   lisinopril (ZESTRIL) 20 MG tablet Take 1 tablet (20 mg total) by mouth daily. 90 tablet 3   metFORMIN (GLUCOPHAGE-XR) 750 MG 24 hr tablet Take 1 tablet (750 mg total) by mouth daily with breakfast. 90 tablet 3   potassium chloride (KLOR-CON M) 10 MEQ tablet Take 1 tablet (10 mEq total) by mouth 2 (two) times daily. 30 tablet 1   rosuvastatin (CRESTOR) 40 MG tablet Take 1 tablet (40 mg total) by mouth daily. 90 tablet 3   Vitamin D, Ergocalciferol, (DRISDOL) 1.25 MG (50000 UNIT) CAPS capsule Take 1 capsule (50,000 Units total) by mouth every 7 (seven) days. 13 capsule 3   No current facility-administered medications for this visit.   Facility-Administered Medications Ordered in Other Visits  Medication Dose Route Frequency Provider Last Rate Last Admin   albuterol (PROVENTIL) (2.5 MG/3ML) 0.083% nebulizer solution 2.5 mg  2.5 mg Nebulization Once Salena Saner, MD       heparin lock flush 100 unit/mL  500 Units Intracatheter Once PRN [name], MD         PHYSICAL EXAMINATION: ECOG PERFORMANCE STATUS: 1 - Symptomatic but completely ambulatory Vitals:   06/03/23 1315 06/03/23 1321  BP: (!) 164/89 (!) 155/88  Pulse: 81   Resp: 18   Temp: (!) 96.7 F (35.9 C)   SpO2: 98%     Filed Weights   06/03/23 1315  Weight: 216 lb 3.2 oz (98.1 kg)     Physical Exam Constitutional:      General: He is not in acute distress.    Appearance: He is obese. He is not diaphoretic.     Comments: Patient walks with a cane  HENT:     Head: Normocephalic and atraumatic.  Eyes:     General: No scleral icterus. Cardiovascular:     Rate and Rhythm: Normal rate and regular rhythm.     Heart sounds: No murmur heard. Pulmonary:     Effort: Pulmonary effort is normal. No respiratory distress.  Abdominal:     General: There is no distension.     Palpations: Abdomen is soft.     Tenderness: There is no abdominal tenderness.  Musculoskeletal:     Cervical back: Normal  range of motion and neck supple.  Skin:    General: Skin is warm and dry.     Findings: No erythema.  Neurological:     Mental Status: He is alert and oriented to person, place, and time. Mental status is at baseline.  Cranial Nerves: No cranial nerve deficit.     Motor: No abnormal muscle tone.  Psychiatric:        Mood and Affect: Mood and affect normal.      LABORATORY DATA:  I have reviewed the data as listed    Latest Ref Rng & Units 06/03/2023    1:01 PM 05/28/2023    1:00 PM 05/14/2023    1:08 PM  CBC  WBC 4.0 - 10.5 K/uL 7.7  6.8  6.2   Hemoglobin 13.0 - 17.0 g/dL 14.7  82.9  56.2   Hematocrit 39.0 - 52.0 % 41.2  38.2  38.7   Platelets 150 - 400 K/uL 217  228  209       Latest Ref Rng & Units 06/03/2023    1:01 PM 06/02/2023   10:49 AM 05/28/2023    1:00 PM  CMP  Glucose 70 - 99 mg/dL 130  86  865   BUN 8 - 23 mg/dL 25  18  21    Creatinine 0.61 - 1.24 mg/dL 7.84  6.96  2.95   Sodium 135 - 145 mmol/L 135  138  140   Potassium 3.5 - 5.1 mmol/L 3.9  4.5  3.5   Chloride 98 - 111 mmol/L 102  100  107   CO2 22 - 32 mmol/L 24  23  25    Calcium 8.9 - 10.3 mg/dL 9.0  9.5  8.6   Total Protein 6.5 - 8.1 g/dL 7.0   6.6   Total Bilirubin <1.2 mg/dL 0.6   0.3   Alkaline Phos 38 - 126 U/L 75   75   AST 15 - 41 U/L 19   20   ALT 0 - 44 U/L 22   20      RADIOGRAPHIC STUDIES: I have personally reviewed the radiological images as listed and agreed with the findings in the report. MR BRAIN W WO CONTRAST  Result Date: 05/10/2023 CLINICAL DATA:  67 year old male with metastatic lung cancer. Prior SRS, and salvage SRS 5 months ago. Restaging. EXAM: MRI HEAD WITHOUT AND WITH CONTRAST TECHNIQUE: Multiplanar, multiecho pulse sequences of the brain and surrounding structures were obtained without and with intravenous contrast. CONTRAST:  10mL GADAVIST GADOBUTROL 1 MMOL/ML IV SOLN COMPARISON:  Brain MRI 02/05/2023 and earlier. FINDINGS: BRAIN New Lesions: None. Larger lesions:  Irregular mostly rim enhancing lesion in the superior left parietal lobe near the vertex has increased fro 10-11 mm in July to now 16-17 mm, more triangular than rounded now (series 18, image 18) and increased regional T2/FLAIR hyperintense increased edema (series 9, image 41). See postcontrast series 16, image 145, series 17, image 112, series 18 image 18. DWI heterogeneity has also mildly increased. Stable or Smaller lesions: Left anterior and inferior cerebellar enhancing lesion on series 16, image 37, 3-4 mm. Right occipital pole rim enhancing lesion on series 16, image 59, now 9-10 mm and slightly smaller. Surrounding mild T2/FLAIR has regressed. Left operculum nodular and rim enhancing lesion which remains about 19 mm overall but appears slightly contracted since July (series 16, image 103). Confluent surrounding T2/FLAIR hyperintensity in a vasogenic edema pattern has not significantly changed and is contiguous with the T2/FLAIR abnormality of superior left parietal lesion. DWI heterogeneity is stable. Other Brain findings: Mild mass effect on the left lateral ventricle is slightly more apparent since July but there is no significant midline shift (series 9, image 32). Other widely scattered nonenhancing cerebral white matter T2 and FLAIR hyperintensity is stable.  Stable mild hemosiderin. No restricted diffusion to suggest acute infarction. No ventriculomegaly, extra-axial collection or acute intracranial hemorrhage. Cervicomedullary junction and pituitary are within normal limits. Vascular: Major intracranial vascular flow voids are stable. Dominant left vertebral artery. Following contrast major dural venous sinuses remain patent. Skull and upper cervical spine: Stable and within normal limits. Sinuses/Orbits: Stable, negative. Other: Mastoids remain well aerated. Visible internal auditory structures appear normal. Negative visible scalp and face. IMPRESSION: 1. Since July progression of a single lesion at  the superior left perirolandic area; mildly larger (up to 17 mm) and with increased regional T2/FLAIR. No significant intracranial mass effect. Favor treatment effect/Radiation Necrosis, as the three additional lesions are all stable or smaller, and no new metastasis is identified. 2. No new intracranial abnormality. Electronically Signed   By: Odessa Fleming M.D.   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 * Tracking Code: BO * EXAM: CT CHEST, ABDOMEN, AND PELVIS WITH CONTRAST TECHNIQUE: Multidetector CT imaging of the chest, abdomen and pelvis was performed following the standard protocol during bolus administration of intravenous contrast. RADIATION DOSE REDUCTION: This exam was performed according to the departmental dose-optimization program which includes automated exposure control, adjustment of the mA and/or kV according to patient size and/or use of iterative reconstruction technique. CONTRAST:  OMNIPAQUE IOHEXOL 350 MG/ML SOLN COMPARISON:  11/25/2022 FINDINGS: CT CHEST FINDINGS Cardiovascular: Aortic atherosclerosis. Right Port-A-Cath tip high right atrium. Normal heart size, without pericardial effusion. Lad and right coronary artery calcification. No central pulmonary embolism, on this non-dedicated study. Mediastinum/Nodes: No supraclavicular adenopathy. No mediastinal or hilar adenopathy. Lungs/Pleura: trace right pleural fluid or thickening is new or increased. Patent airways. Mild centrilobular emphysema. Similar appearance of central right upper lobe and perihilar consolidation with volume loss and traction bronchiectasis, consistent with radiation fibrosis. No local recurrence. The interstitial opacities including within the left lower lobe have resolved. Musculoskeletal: No acute osseous abnormality. CT ABDOMEN PELVIS FINDINGS Hepatobiliary: Mild hepatic steatosis  without focal liver lesion or biliary abnormality. Pancreas: Normal, without mass or ductal dilatation. Spleen: Normal in size, without focal abnormality. Adrenals/Urinary Tract: Normal adrenal glands. Normal kidneys, without hydronephrosis. Normal urinary bladder. Stomach/Bowel: Normal stomach, without wall thickening. Extensive colonic diverticulosis. Normal terminal ileum and appendix. Normal small bowel. Vascular/Lymphatic: Aortic atherosclerosis. No abdominopelvic adenopathy. Right superficial femoral artery grafts are incompletely imaged but again appear occluded. Reproductive: Normal prostate.  Small bilateral hydroceles. Other: No significant free fluid. No evidence of omental or peritoneal disease. Musculoskeletal: Partial degenerative fusion of the bilateral sacroiliac joints. Lumbar spine fixation at L5-S1. IMPRESSION: 1. 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 2. Trace right pleural fluid or thickening, new or minimally increased. 3. Incidental findings, including: Aortic atherosclerosis (ICD10-I70.0), coronary artery atherosclerosis and emphysema (ICD10-J43.9). Hepatic steatosis. Electronically Signed   By: Jeronimo Greaves M.D.   On: 03/16/2023 14:43

## 2023-06-04 ENCOUNTER — Other Ambulatory Visit: Payer: Self-pay

## 2023-06-09 ENCOUNTER — Other Ambulatory Visit: Payer: Self-pay | Admitting: *Deleted

## 2023-06-09 MED ORDER — POTASSIUM CHLORIDE CRYS ER 10 MEQ PO TBCR: 10.0000 meq | EXTENDED_RELEASE_TABLET | Freq: Two times a day (BID) | ORAL | 1 refills | Status: DC

## 2023-06-09 NOTE — Telephone Encounter (Signed)
Patient called asking if he needs to continue to take potassium pills snd if so he will need a refill for it  Component Ref Range & Units 6 d ago (06/03/23) 7 d ago (06/02/23) 12 d ago (05/28/23) 3 wk ago (05/14/23) 1 mo ago (04/30/23) 1 mo ago (04/16/23) 2 mo ago (04/02/23)  Potassium 3.5 - 5.1 mmol/L 3.9 4.5 R 3.5 3.3 Low  3.2 Low  3.4 Low  3.7

## 2023-06-11 ENCOUNTER — Ambulatory Visit: Payer: Medicare Other | Admitting: Oncology

## 2023-06-11 ENCOUNTER — Other Ambulatory Visit: Payer: Medicare Other

## 2023-06-11 ENCOUNTER — Ambulatory Visit: Payer: Medicare Other

## 2023-06-17 ENCOUNTER — Inpatient Hospital Stay: Payer: Medicare Other

## 2023-06-17 ENCOUNTER — Inpatient Hospital Stay (HOSPITAL_BASED_OUTPATIENT_CLINIC_OR_DEPARTMENT_OTHER): Payer: Medicare Other | Admitting: Oncology

## 2023-06-17 ENCOUNTER — Other Ambulatory Visit: Payer: Medicare Other

## 2023-06-17 ENCOUNTER — Encounter: Payer: Self-pay | Admitting: Oncology

## 2023-06-17 VITALS — BP 109/66 | HR 86 | Temp 97.4°F | Resp 20 | Wt 218.0 lb

## 2023-06-17 DIAGNOSIS — C7931 Secondary malignant neoplasm of brain: Secondary | ICD-10-CM | POA: Diagnosis not present

## 2023-06-17 DIAGNOSIS — C3491 Malignant neoplasm of unspecified part of right bronchus or lung: Secondary | ICD-10-CM | POA: Diagnosis not present

## 2023-06-17 DIAGNOSIS — E032 Hypothyroidism due to medicaments and other exogenous substances: Secondary | ICD-10-CM

## 2023-06-17 DIAGNOSIS — Z5112 Encounter for antineoplastic immunotherapy: Secondary | ICD-10-CM | POA: Diagnosis not present

## 2023-06-17 LAB — COMPREHENSIVE METABOLIC PANEL
ALT: 18 U/L (ref 0–44)
AST: 23 U/L (ref 15–41)
Albumin: 3.8 g/dL (ref 3.5–5.0)
Alkaline Phosphatase: 77 U/L (ref 38–126)
Anion gap: 9 (ref 5–15)
BUN: 20 mg/dL (ref 8–23)
CO2: 25 mmol/L (ref 22–32)
Calcium: 8.9 mg/dL (ref 8.9–10.3)
Chloride: 104 mmol/L (ref 98–111)
Creatinine, Ser: 0.99 mg/dL (ref 0.61–1.24)
GFR, Estimated: >60 mL/min (ref >60)
Glucose, Bld: 101 mg/dL — ABNORMAL HIGH (ref 70–99)
Potassium: 3.7 mmol/L (ref 3.5–5.1)
Sodium: 138 mmol/L (ref 135–145)
Total Bilirubin: 0.7 mg/dL (ref <1.2)
Total Protein: 6.9 g/dL (ref 6.5–8.1)

## 2023-06-17 LAB — CBC WITH DIFFERENTIAL/PLATELET
Abs Immature Granulocytes: 0.01 10*3/uL (ref 0.00–0.07)
Basophils Absolute: 0.1 10*3/uL (ref 0.0–0.1)
Basophils Relative: 1 %
Eosinophils Absolute: 0.2 10*3/uL (ref 0.0–0.5)
Eosinophils Relative: 4 %
HCT: 37.4 % — ABNORMAL LOW (ref 39.0–52.0)
Hemoglobin: 12.2 g/dL — ABNORMAL LOW (ref 13.0–17.0)
Immature Granulocytes: 0 %
Lymphocytes Relative: 23 %
Lymphs Abs: 1.1 10*3/uL (ref 0.7–4.0)
MCH: 26.9 pg (ref 26.0–34.0)
MCHC: 32.6 g/dL (ref 30.0–36.0)
MCV: 82.4 fL (ref 80.0–100.0)
Monocytes Absolute: 0.3 10*3/uL (ref 0.1–1.0)
Monocytes Relative: 7 %
Neutro Abs: 3.1 10*3/uL (ref 1.7–7.7)
Neutrophils Relative %: 65 %
Platelets: 195 10*3/uL (ref 150–400)
RBC: 4.54 MIL/uL (ref 4.22–5.81)
RDW: 14.6 % (ref 11.5–15.5)
WBC: 4.8 10*3/uL (ref 4.0–10.5)
nRBC: 0 % (ref 0.0–0.2)

## 2023-06-17 LAB — TSH: TSH: 16.446 u[IU]/mL — ABNORMAL HIGH (ref 0.350–4.500)

## 2023-06-17 MED ORDER — SODIUM CHLORIDE 0.9 % IV SOLN
10.0000 mg/kg | Freq: Once | INTRAVENOUS | Status: AC
  Administered 2023-06-17: 1000 mg via INTRAVENOUS
  Filled 2023-06-17: qty 20

## 2023-06-17 MED ORDER — HEPARIN SOD (PORK) LOCK FLUSH 100 UNIT/ML IV SOLN
500.0000 [IU] | Freq: Once | INTRAVENOUS | Status: AC | PRN
  Administered 2023-06-17: 500 [IU]
  Filled 2023-06-17: qty 5

## 2023-06-17 MED ORDER — SODIUM CHLORIDE 0.9% FLUSH
10.0000 mL | INTRAVENOUS | Status: DC | PRN
  Administered 2023-06-17: 10 mL
  Filled 2023-06-17: qty 10

## 2023-06-17 MED ORDER — SODIUM CHLORIDE 0.9 % IV SOLN
Freq: Once | INTRAVENOUS | Status: AC
  Filled 2023-06-17: qty 250

## 2023-06-17 NOTE — Assessment & Plan Note (Signed)
continue levothyroxine 125 mcg daily

## 2023-06-17 NOTE — Assessment & Plan Note (Signed)
Status post radiation,  02/19/22 MRI showed decreased brain lesion size and edema.  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 Mitzi Hansen.  Follow up with Dr. Barbaraann Cao.

## 2023-06-17 NOTE — Assessment & Plan Note (Addendum)
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  May 2024 CT scan showed Improving bilateral scattered interstitial and ground-glass opacities. Discussed with pulmonology Dr. Jayme Cloud. 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  Labs are reviewed and discussed with patient. Proceed with  Durvalumab.  Repeat CT

## 2023-06-17 NOTE — Progress Notes (Signed)
Hematology/Oncology Progress note [phone] [phone]    CHIEF COMPLAINTS/REASON FOR VISIT:  Follow up for lung cancer   ASSESSMENT & PLAN:   Cancer Staging  Squamous cell carcinoma of right lung (HCC) Staging form: Lung, AJCC 8th Edition - Clinical stage from 09/20/2020: Stage IV (cT1b, cN2, cM1) - Signed by [name], MD on 12/13/2021   Squamous cell carcinoma of right lung (HCC) Durvalumab was previously held due to pneumonitis.  May 2024 CT scan showed Improving bilateral scattered interstitial and ground-glass opacities. Discussed with pulmonology Dr. Jayme Cloud. 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  Labs are reviewed and discussed with patient. Proceed with  Durvalumab.  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   Malignant neoplasm metastatic to brain [facility]) Status post radiation,  02/19/22 MRI showed decreased brain lesion size and edema.  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 Mitzi Hansen.  Follow up with Dr. Barbaraann Cao.     Follow up 1w lab MD Durvalumab All questions were answered. The patient knows to call the clinic with any problems, questions or concerns.  [name], MD, PhD [facility] Hematology Oncology 06/17/2023       HISTORY OF PRESENTING ILLNESS:  Joshua [name] is a 67 y.o. male who has above history reviewed by me today presents for follow up visit for Stage IV lung squamous cell carcinoma. Oncology History  Squamous cell carcinoma of  right lung (HCC)  09/18/2020 Imaging   PET scan showed right upper lobe nodule maximal SUV 11.5.  Hypermetabolic cluster right paratracheal adenopathy with maximum SUV up to 12.  No findings of metastatic disease to the neck/abdomen/pelvis or skeleton.   09/20/2020 Initial Diagnosis   Squamous cell carcinoma of right lung (HCC) NGS showed PD-L1 TPS 95%, LRP1B S941fs, PIK3CA E545K, RB1 c1422-2A>T, RIT1 T83_ A84del, STK11 P296fs, TPS53 R248W, TMB 19.84mut/mb, MS stable.    09/20/2020 Cancer Staging   Staging form: Lung, AJCC 8th Edition - Clinical stage from 09/20/2020: Stage IV (cT1b, cN2, cM1) - Signed by [name], MD on 12/13/2021   10/03/2020 Imaging   MRI brain with and without contrast showed no evidence of intracranial metastatic disease. Well-defined round lesion within the left parotid tail measuring approximately 2.3 x 1.8 x 1.8 cm   10/09/2020 - 11/27/2020 Radiation Therapy   concurrent chemoradiation   10/10/2020 - 10/31/2020 Chemotherapy   10/10/2020 concurrent chemoradiation.  carboplatin AUC of 2 and Taxol 45 mg/m2 x 4.  Additional chemotherapy was held due to thrombocytopenia, neutropenia       12/31/2020 - 03/21/2022 Chemotherapy   Maintenance  Durvalumab q14d      12/31/2020 -  Chemotherapy   Patient is on Treatment Plan : LUNG Durvalumab (10) q14d     03/28/2021 Imaging   CT without contrast showed new linear right perihilar consolidation with associated traction bronchiectasis.  Likely postradiation changes.  Additional new peribronchial vascular/nodular groundglass opacities with areas of consolidation or seen primarily in the right upper and lower lobes, more distant from expected radiation fields. 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  Per my discussion with pulmonology Dr. 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     05/09/2021 Imaging    Repeat CT chest without contrast showed evolving radiation changes in the right perihilar region likely accounting for interval increase to thickening of the posterior wall of the right upper lobe bronchus.  The more peripheral right lung groundglass opacities seen on most recent study have generally improved with exception of one peripherally right upper lobe which appears new.  These are likely inflammatory.  No findings highly suspicious for local recurrence.   05/30/2021 - 01/10/2022 Chemotherapy   Durvalumab q14d     10/07/2021 Imaging   MRI brain with and without contrast showed 2 contrast-enhancing lesions, most consistent with metastatic disease.  Largest lesion is in the left frontal lobe and measures up to 1.3 cm with a large amount of surrounding edema and 9 mm rightward midline shift.  The other enhancing lesion was 4 mm in the right occipital lobe with a small amount of surrounding edema.  No acute or chronic hemorrhage.   10/07/2021 Progression   Headache after colonoscopy, progressive difficulty using her right arm.  Progressively worsening of speech. Stage IV lung cancer with brain metastasis.  started on Dexamethasone.  He was seen by neurosurgery and radiation oncology.  Recommendation is to proceed with SRS.  Patient was discharged with dexamethasone   10/16/2021 Imaging   PET scan showed postradiation changes in the right supra hilar lung without evidence of local recurrence.  No evidence of lung cancer recurrence or metastatic disease.  Single focus of intense metabolic activity associated with posterior left 11th rib likely secondary to recent fall/posttraumatic metabolic changes.  Frontal  uptake in the left frontal lobe corresponding to the known metastatic lesion in the brain   10/29/2021 -  Radiation Therapy   Brain Radiation [facility]   12/10/2021 Imaging   Brain MRI: Continued interval decrease in size of a left frontal lobe metastasis, now measuring 10 mm. Moderate surrounding  edema has also decreased.  3-4 mm metastasis within the right occipital lobe, not significantly changed in size. There is now a small focus of central non enhancement within this lesion. Increased edema. 3 mm enhancing metastasis within the high left parietal lobe. In retrospect, this was likely present as a subtle punctate focus of enhancement on the prior brain MRI of 10/22/2021.   01/18/2022 Imaging   CT chest abdomen pelvis w contrast 1. Stable post treatment changes in the right upper lobe medially with dense radiation fibrosis but no findings to suggest local recurrent tumor. 2. Significant progression of ill-defined ground-glass opacity, interstitial thickening and airspace nodularity in the right upper lobe and upper aspect of the right lower lobe. Findings could reflect an inflammatory or atypical infectious process, drug induced pneumonitis or interstitial spread of tumor.3. No mediastinal or hilar mass or adenopathy. 4. No findings for abdominal/pelvic metastatic disease or osseous metastatic disease.   02/19/2022 Imaging   MRI brain w wo contrast showed Three metastatic deposits in the brain appear smaller. Decreased edema in the left parietal lobe. No new lesions    04/04/2022 Imaging   CT chest w contrast 1. Stable radiation changes in the right perihilar region without evidence of local recurrence. 2. Interval improvement in the patchy ground-glass opacities previously demonstrated in both lungs, most consistent with a resolving infectious or inflammatory process (including immunotherapy-related pneumonitis). 3. No evidence of metastatic disease.  05/14/2022 Miscellaneous   He had a mechanical fall and fractured his left elbow. xrays showed a left elbow fracture  05/26/22 seen by orthopedic surgeon, cast applied.    05/27/2022 Imaging   MRI brain w wo contrast 1. All three known brain metastases have enlarged since August, with increasing regional edema. And there is a small new  left anterior cerebellar 4-5 mm metastasis (was questionably punctate on the May MRI). This constellation could reflect a combination of True Progression (Left Cerebellar lesion) and pseudoprogression/radionecrosis (3 existing mets).    2. No significant intracranial mass effect. 3. Underlying moderate cerebral white matter signal changes.   05/30/2022 Imaging   CT chest abdomen pelvis w contrast  1. 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 There is a stable small right pleural effusion, but no definitive findings to suggest residual or recurrent disease on today's examination. 2. No signs of metastatic disease in the abdomen or pelvis.  3. Aortic atherosclerosis, in addition to two-vessel coronary artery disease. Please note that although the presence of coronary artery calcium documents the presence of coronary artery disease, the severity of this disease and any potential stenosis cannot be assessed on this non-gated CT examination. Assessment for potential risk factor modification, dietary therapy or pharmacologic therapy may be warranted, if clinically indicated. 4. Hepatic steatosis. 5. Colonic diverticulosis without evidence of acute diverticulitis at this time. 6. Incompletely imaged but apparently occluded vascular graft in the superior aspect of the right anterior thigh. Referral to vascular specialist for further clinical evaluation should be considered if clinically appropriate. 7. Additional incidental findings, as above.   07/22/2022 Imaging   MRI brain  1. No new or enlarging lesions. No intracranial disease progression. 2. Decreased size of treated lesions of the left frontoparietal junction and left postcentral gyrus. 3. Unchanged lesions in the left cerebellum and right occipital lobe.   10/15/2022 Imaging   MRI brain w wo contrast showed Interval increase  in size of multiple intraparenchymal lesions with worsened surrounding vasogenic edema. 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 No new lesions   11/05/2022 Imaging   MRI brain w wo contrast  1. Likely slight increase and right occipital and left cerebellar lesions. Otherwise, unchanged metastases, as above. No new lesions identified. 2. Partially imaged large probable lymph node in the left upper neck, apparent on prior PET CT.   11/13/2022 -  Radiation Therapy   Each of the sites were treated with SRS/SBRT/SRT-IMRT technique to 20 Gy in 1 fraction to each of the following sites: PTV_2_LCerebel_18mm PTV_3_RCerebel_65mm PTV_4_ROccip_52mm PTV_5_LParietal_94mm   11/27/2022 Imaging   CT chest abdomen pelvis w contrast showed  Improving bilateral scattered interstitial and ground-glass opacities. Small amount of residual areas in the extreme left lower lobe inferiorly. This specific area is slightly increased from previous but overall the amount of opacities is significantly improved. Recommend continued follow-up. No developing new mass lesion, fluid collection or lymph node enlargement. Stable posttreatment changes right perihilar in the lung. Fatty liver infiltration. Colonic diverticulosis. Improved tiny right pleural effusion   02/05/2023 Imaging   MRI brain with and without contrast Interval decrease in all of the metastatic lesions, detailed above.No new lesions identified.    Imaging   CT chest abdomen pelvis w contrast showed  1. 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 2. Trace right pleural fluid  or thickening, new or minimally increased. 3. Incidental findings, including: Aortic atherosclerosis (ICD10-I70.0), coronary artery atherosclerosis and emphysema (ICD10-J43.9). Hepatic steatosis.    Today he feels well.Intermittent headache, is  better. denies any focal deficits. chronic SOB,not worse.  He denies any new complaint.    Review of Systems  Constitutional:  Positive for fatigue. Negative for appetite change, chills, fever and unexpected weight change.  HENT:   Negative for hearing loss and voice change.   Eyes:  Negative for eye problems and icterus.  Respiratory:  Negative for chest tightness, cough and shortness of breath.   Cardiovascular:  Negative for chest pain and leg swelling.  Gastrointestinal:  Negative for abdominal distention and abdominal pain.  Endocrine: Negative for hot flashes.  Genitourinary:  Negative for difficulty urinating, dysuria and frequency.   Musculoskeletal:  Negative for arthralgias.  Skin:  Negative for itching and rash.  Neurological:  Negative for extremity weakness, headaches, light-headedness, numbness and speech difficulty.  Hematological:  Negative for adenopathy. Does not bruise/bleed easily.  Psychiatric/Behavioral:  Negative for confusion.       MEDICAL HISTORY:  Past Medical History:  Diagnosis Date   Allergy    Cancer (HCC)    lung   COPD (chronic obstructive pulmonary disease) (HCC)    Dyspnea    former smoker. 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   Peripheral vascular disease (HCC)    Pre-diabetes    Sleep apnea     SURGICAL HISTORY: Past Surgical History:  Procedure Laterality Date   ANTERIOR CERVICAL DECOMP/DISCECTOMY FUSION N/A 03/14/2020   Procedure: ANTERIOR CERVICAL DECOMPRESSION/DISCECTOMY FUSION 3 LEVELS C4-7;  Surgeon: Venetia Night, MD;  Location: [facility] ORS;  Service: Neurosurgery;  Laterality: N/A;   BACK SURGERY  2011   CENTRAL LINE INSERTION Right 09/10/2016   Procedure: CENTRAL LINE INSERTION;  Surgeon: Renford Dills, MD;  Location: [facility];  Service: Vascular;  Laterality: Right;   CERVICAL FUSION     C4-c7 on Mar 14, 2020   COLONOSCOPY  2013 ?   COLONOSCOPY N/A  09/09/2021   Procedure: COLONOSCOPY;  Surgeon: Jaynie Collins, DO;  Location: [facility] ENDOSCOPY;  Service: Gastroenterology;  Laterality: N/A;   CORONARY ANGIOPLASTY     ESOPHAGOGASTRODUODENOSCOPY N/A 09/09/2021   Procedure: ESOPHAGOGASTRODUODENOSCOPY (EGD);  Surgeon: Jaynie Collins, DO;  Location: [facility]COPY;  Service: Gastroenterology;  Laterality: N/A;   FEMORAL-POPLITEAL BYPASS GRAFT Right 09/10/2016   Procedure: BYPASS GRAFT FEMORAL-POPLITEAL ARTERY ( BELOW KNEE );  Surgeon: Renford Dills, MD;  Location: [facility];  Service: Vascular;  Laterality: Right;   FEMORAL-TIBIAL BYPASS GRAFT Right 03/23/2020   Procedure: BYPASS GRAFT RIGHT FEMORAL- DISTAL TIBIAL ARTERY WITH DISTAL FLOW GRAFT;  Surgeon: Renford Dills, MD;  Location: [facility];  Service: Vascular;  Laterality: Right;   LEFT HEART CATH AND CORONARY ANGIOGRAPHY Left 05/16/2021   Procedure: LEFT HEART CATH AND CORONARY ANGIOGRAPHY;  Surgeon: Armando Reichert, MD;  Location: [facility] INVASIVE CV LAB;  Service: Cardiovascular;  Laterality: Left;   LOWER EXTREMITY ANGIOGRAPHY Right 11/18/2016   Procedure: Lower Extremity Angiography;  Surgeon: Renford Dills, MD;  Location: [facility] INVASIVE CV LAB;  Service: Cardiovascular;  Laterality: Right;   LOWER EXTREMITY ANGIOGRAPHY Right 12/09/2016   Procedure: Lower Extremity Angiography;  Surgeon: Renford Dills, MD;  Location: [facility] INVASIVE CV LAB;  Service: Cardiovascular;  Laterality: Right;   LOWER EXTREMITY ANGIOGRAPHY Right 11/15/2019   Procedure: LOWER EXTREMITY  ANGIOGRAPHY;  Surgeon: Renford Dills, MD;  Location: [facility] INVASIVE CV LAB;  Service: Cardiovascular;  Laterality: Right;   LOWER EXTREMITY ANGIOGRAPHY Right 12/01/2019   Procedure: Lower Extremity Angiography;  Surgeon: Annice Needy, MD;  Location: [facility] INVASIVE CV LAB;  Service: Cardiovascular;  Laterality: Right;   LOWER EXTREMITY ANGIOGRAPHY Right 12/02/2019   Procedure: LOWER EXTREMITY ANGIOGRAPHY;  Surgeon:  Renford Dills, MD;  Location: [facility] INVASIVE CV LAB;  Service: Cardiovascular;  Laterality: Right;   LOWER EXTREMITY ANGIOGRAPHY Right 03/23/2020   Procedure: Lower Extremity Angiography;  Surgeon: Renford Dills, MD;  Location: [facility] IN[facility] LAB;  Service: Cardiovascular;  Laterality: Right;   LOWER EXTREMITY ANGIOGRAPHY Right 09/25/2021   Procedure: Lower Extremity Angiography;  Surgeon: Renford Dills, MD;  Location: [facility] INV[facility] LAB;  Service: Cardiovascular;  Laterality: Right;   LOWER EXTREMITY ANGIOGRAPHY Right 09/26/2021   Procedure: Lower Extremity Angiography;  Surgeon: Annice Needy, MD;  Location: [facility] INV[facility] LAB;  Service: Cardiovascular;  Laterality: Right;   LOWER EXTREMITY ANGIOGRAPHY Right 04/08/2022   Procedure: Lower Extremity Angiography;  Surgeon: Renford Dills, MD;  Location: [facility] INV[facility] CV LAB;  Service: Cardiovascular;  Laterality: Right;   LOWER EXTREMITY INTERVENTION  11/18/2016   Procedure: Lower Extremity Intervention;  Surgeon: Renford Dills, MD;  Location: [facility] INV[facility] CV LAB;  Service: Cardiovascular;;   PERIPHERAL VASCULAR CATHETERIZATION Right 01/02/2015   Procedure: Lower Extremity Angiography;  Surgeon: Renford Dills, MD;  Location: [facility] INV[facility] CV [facility];  Service: Cardiovascular;  Laterality: Right;   PERIPHERAL VASCULAR CATHETERIZATION Right 06/18/2015   Procedure: Lower Extremity Angiography;  Surgeon: Annice Needy, MD;  Location: [facility] INV[facility] LAB;  Service: Cardiovascular;  Laterality: Right;   PERIPHERAL VASCULAR CATHETERIZATION  06/18/2015   Procedure: Lower Extremity Intervention;  Surgeon: Annice Needy, MD;  Location: [facility] IN[facility] CV LAB;  Service: Cardiovascular;;   PERIPHERAL VASCULAR CATHETERIZATION N/A 10/09/2015   Procedure: Abdominal Aortogram w/Lower Extremity;  Surgeon: Renford Dills, MD;  Location: [facility];  Service: Cardiovascular;  Laterality: N/A;   PERIPHERAL VASCULAR CATHETERIZATION  10/09/2015    Procedure: Lower Extremity Intervention;  Surgeon: Renford Dills, MD;  Location: [facility];  Service: Cardiovascular;;   PERIPHERAL VASCULAR CATHETERIZATION Right 10/10/2015   Procedure: Lower Extremity Angiography;  Surgeon: Annice Needy, MD;  Location: [facility];  Service: Cardiovascular;  Laterality: Right;   PERIPHERAL VASCULAR CATHETERIZATION Right 10/10/2015   Procedure: Lower Extremity Intervention;  Surgeon: Annice Needy, MD;  Location: [facility];  Service: Cardiovascular;  Laterality: Right;   PERIPHERAL VASCULAR CATHETERIZATION Right 12/03/2015   Procedure: Lower Extremity Angiography;  Surgeon: Annice Needy, MD;  Location: [facility];  Service: Cardiovascular;  Laterality: Right;   PERIPHERAL VASCULAR CATHETERIZATION Right 12/04/2015   Procedure: Lower Extremity Angiography;  Surgeon: Annice Needy, MD;  Location: [facility];  Service: Cardiovascular;  Laterality: Right;   PERIPHERAL VASCULAR CATHETERIZATION  12/04/2015   Procedure: Lower Extremity Intervention;  Surgeon: Annice Needy, MD;  Location: [facility];  Service: Cardiovascular;;   PERIPHERAL VASCULAR CATHETERIZATION Right 06/30/2016   Procedure: Lower Extremity Angiography;  Surgeon: Annice Needy, MD;  Location: [facility];  Service: Cardiovascular;  Laterality: Right;   PERIPHERAL VASCULAR CATHETERIZATION Right 06/25/2016   Procedure: Lower Extremity Angiography;  Surgeon: Renford Dills, MD;  Location: [facility];  Service: Cardiovascular;  Laterality: Right;  PERIPHERAL VASCULAR CATHETERIZATION Right 07/01/2016   Procedure: Lower Extremity Angiography;  Surgeon: Renford Dills, MD;  Location: [facility];  Service: Cardiovascular;  Laterality: Right;   PERIPHERAL VASCULAR CATHETERIZATION  07/01/2016   Procedure: Lower Extremity Intervention;  Surgeon: Renford Dills, MD;  Location: [facility];  Service: Cardiovascular;;    PERIPHERAL VASCULAR CATHETERIZATION Right 08/01/2016   Procedure: Lower Extremity Angiography;  Surgeon: Renford Dills, MD;  Location: [facility];  Service: Cardiovascular;  Laterality: Right;   PORTA CATH INSERTION N/A 10/05/2020   Procedure: PORTA CATH INSERTION;  Surgeon: Renford Dills, MD;  Location: [facility];  Service: Cardiovascular;  Laterality: N/A;   stent placement in right leg Right    VIDEO BRONCHOSCOPY N/A 09/14/2020   Procedure: VIDEO BRONCHOSCOPY WITH FLUORO;  Surgeon: Salena Saner, MD;  Location: [facility];  Service: Cardiopulmonary;  Laterality: N/A;   VIDEO BRONCHOSCOPY WITH ENDOBRONCHIAL ULTRASOUND N/A 09/14/2020   Procedure: VIDEO BRONCHOSCOPY WITH ENDOBRONCHIAL ULTRASOUND;  Surgeon: Salena Saner, MD;  Location: [facility];  Service: Cardiopulmonary;  Laterality: N/A;    SOCIAL HISTORY: Social History   Socioeconomic History   Marital status: Single    Spouse name: Not on file   Number of children: Not on file   Years of education: Not on file   Highest education level: Not on file  Occupational History   Occupation: unemployed  Tobacco Use   Smoking status: Former    Current packs/day: 0.00    Average packs/day: 2.0 packs/day for 38.0 years (76.0 ttl pk-yrs)    Types: Cigarettes    Start date: 12/19/1976    Quit date: 12/20/2014    Years since quitting: 8.4   Smokeless tobacco: Never   Tobacco comments:    quit smoking in 2017 most smoked 2   Vaping Use   Vaping status: Former  Substance and Sexual Activity   Alcohol use: Not Currently   Drug use: No   Sexual activity: Yes  Other Topics Concern   Not on file  Social History Narrative   Not on file   Social Determinants of Health   Financial Resource Strain: Patient Declined (05/29/2023)   Overall Financial Resource Strain (CARDIA)    Difficulty of Paying Living Expenses: Patient declined  Food Insecurity: Patient Declined (05/29/2023)   Hunger Vital Sign     Worried About Running Out of Food in the Last Year: Patient declined    Ran Out of Food in the Last Year: Patient declined  Transportation Needs: No Transportation Needs (05/29/2023)   PRAPARE - Administrator, Civil Service (Medical): No    Lack of Transportation (Non-Medical): No  Physical Activity: Insufficiently Active (05/29/2023)   Exercise Vital Sign    Days of Exercise per Week: 2 days    Minutes of Exercise per Session: 10 min  Stress: No Stress Concern Present (05/29/2023)   [employer] of Occupational Health - Occupational Stress Questionnaire    Feeling of Stress : Not at all  Social Connections: Unknown (05/29/2023)   Social Connection and Isolation Panel [NHANES]    Frequency of Communication with Friends and Family: More than three times a week    Frequency of Social Gatherings with Friends and Family: Three times a week    Attends Religious Services: Patient declined    Active Member of Clubs or Organizations: No    Attends Banker Meetings: Not on file    Marital Status:  Patient declined  Intimate Partner Violence: Not on file    FAMILY HISTORY: Family History  Problem Relation Age of Onset   Diabetes Mother    Hypertension Mother    Heart murmur Mother    Leukemia Mother    Throat cancer Maternal Grandmother    Colon cancer Neg Hx    Breast cancer Neg Hx     ALLERGIES:  has No Known Allergies.  MEDICATIONS:  Current Outpatient Medications  Medication Sig Dispense Refill   aspirin EC 81 MG tablet Take 81 mg by mouth daily. Swallow whole.     clopidogrel (PLAVIX) 75 MG tablet TAKE 1 TABLET BY MOUTH DAILY 100 tablet 2   levothyroxine (SYNTHROID) 125 MCG tablet Take 1 tablet (125 mcg total) by mouth daily before breakfast. 90 tablet 0   lisinopril (ZESTRIL) 20 MG tablet Take 1 tablet (20 mg total) by mouth daily. 90 tablet 3   metFORMIN (GLUCOPHAGE-XR) 750 MG 24 hr tablet Take 1 tablet (750 mg total) by mouth daily with breakfast.  90 tablet 3   potassium chloride (KLOR-CON M) 10 MEQ tablet Take 1 tablet (10 mEq total) by mouth 2 (two) times daily. 30 tablet 1   rosuvastatin (CRESTOR) 40 MG tablet Take 1 tablet (40 mg total) by mouth daily. 90 tablet 3   Vitamin D, Ergocalciferol, (DRISDOL) 1.25 MG (50000 UNIT) CAPS capsule Take 1 capsule (50,000 Units total) by mouth every 7 (seven) days. 13 capsule 3   No current facility-administered medications for this visit.   Facility-Administered Medications Ordered in Other Visits  Medication Dose Route Frequency Provider Last Rate Last Admin   albuterol (PROVENTIL) (2.5 MG/3ML) 0.083% nebulizer solution 2.5 mg  2.5 mg Nebulization Once Salena Saner, MD       heparin lock flush 100 unit/mL  500 Units Intracatheter Once PRN [name], MD         PHYSICAL EXAMINATION: ECOG PERFORMANCE STATUS: 1 - Symptomatic but completely ambulatory Vitals:   06/17/23 1311  BP: 109/66  Pulse: 86  Resp: 20  Temp: (!) 97.4 F (36.3 C)  SpO2: 100%    Filed Weights   06/17/23 1311  Weight: 218 lb (98.9 kg)     Physical Exam Constitutional:      General: He is not in acute distress.    Appearance: He is obese.  HENT:     Head: Normocephalic and atraumatic.  Eyes:     General: No scleral icterus. Cardiovascular:     Rate and Rhythm: Normal rate and regular rhythm.  Pulmonary:     Effort: Pulmonary effort is normal. No respiratory distress.  Abdominal:     General: There is no distension.     Palpations: Abdomen is soft.     Tenderness: There is no abdominal tenderness.  Musculoskeletal:     Cervical back: Normal range of motion and neck supple.  Skin:    General: Skin is warm and dry.     Findings: No erythema.  Neurological:     Mental Status: He is alert and oriented to person, place, and time. Mental status is at baseline.     Cranial Nerves: No cranial nerve deficit.     Motor: No abnormal muscle tone.  Psychiatric:        Mood and Affect: Mood and affect  normal.      LABORATORY DATA:  I have reviewed the data as listed    Latest Ref Rng & Units 06/17/2023   12:57 PM 06/03/2023  1:01 PM 05/28/2023    1:00 PM  CBC  WBC 4.0 - 10.5 K/uL 4.8  7.7  6.8   Hemoglobin 13.0 - 17.0 g/dL 16.1  09.6  04.5   Hematocrit 39.0 - 52.0 % 37.4  41.2  38.2   Platelets 150 - 400 K/uL 195  217  228       Latest Ref Rng & Units 06/17/2023   12:57 PM 06/03/2023    1:01 PM 06/02/2023   10:49 AM  CMP  Glucose 70 - 99 mg/dL 409  811  86   BUN 8 - 23 mg/dL 20  25  18    Creatinine 0.61 - 1.24 mg/dL 9.14  7.82  9.56   Sodium 135 - 145 mmol/L 138  135  138   Potassium 3.5 - 5.1 mmol/L 3.7  3.9  4.5   Chloride 98 - 111 mmol/L 104  102  100   CO2 22 - 32 mmol/L 25  24  23    Calcium 8.9 - 10.3 mg/dL 8.9  9.0  9.5   Total Protein 6.5 - 8.1 g/dL 6.9  7.0    Total Bilirubin <1.2 mg/dL 0.7  0.6    Alkaline Phos 38 - 126 U/L 77  75    AST 15 - 41 U/L 23  19    ALT 0 - 44 U/L 18  22       RADIOGRAPHIC STUDIES: I have personally reviewed the radiological images as listed and agreed with the findings in the report. MR BRAIN W WO CONTRAST  Result Date: 05/10/2023 CLINICAL DATA:  67 year old male with metastatic lung cancer. Prior SRS, and salvage SRS 5 months ago. Restaging. EXAM: MRI HEAD WITHOUT AND WITH CONTRAST TECHNIQUE: Multiplanar, multiecho pulse sequences of the brain and surrounding structures were obtained without and with intravenous contrast. CONTRAST:  10mL GADAVIST GADOBUTROL 1 MMOL/ML IV SOLN COMPARISON:  Brain MRI 02/05/2023 and earlier. FINDINGS: BRAIN New Lesions: None. Larger lesions: Irregular mostly rim enhancing lesion in the superior left parietal lobe near the vertex has increased fro 10-11 mm in July to now 16-17 mm, more triangular than rounded now (series 18, image 18) and increased regional T2/FLAIR hyperintense increased edema (series 9, image 41). See postcontrast series 16, image 145, series 17, image 112, series 18 image 18. DWI  heterogeneity has also mildly increased. Stable or Smaller lesions: Left anterior and inferior cerebellar enhancing lesion on series 16, image 37, 3-4 mm. Right occipital pole rim enhancing lesion on series 16, image 59, now 9-10 mm and slightly smaller. Surrounding mild T2/FLAIR has regressed. Left operculum nodular and rim enhancing lesion which remains about 19 mm overall but appears slightly contracted since July (series 16, image 103). Confluent surrounding T2/FLAIR hyperintensity in a vasogenic edema pattern has not significantly changed and is contiguous with the T2/FLAIR abnormality of superior left parietal lesion. DWI heterogeneity is stable. Other Brain findings: Mild mass effect on the left lateral ventricle is slightly more apparent since July but there is no significant midline shift (series 9, image 32). Other widely scattered nonenhancing cerebral white matter T2 and FLAIR hyperintensity is stable. Stable mild hemosiderin. No restricted diffusion to suggest acute infarction. No ventriculomegaly, extra-axial collection or acute intracranial hemorrhage. Cervicomedullary junction and pituitary are within normal limits. Vascular: Major intracranial vascular flow voids are stable. Dominant left vertebral artery. Following contrast major dural venous sinuses remain patent. Skull and upper cervical spine: Stable and within normal limits. Sinuses/Orbits: Stable, negative. Other: Mastoids remain well aerated. Visible  internal auditory structures appear normal. Negative visible scalp and face. IMPRESSION: 1. Since July progression of a single lesion at the superior left perirolandic area; mildly larger (up to 17 mm) and with increased regional T2/FLAIR. No significant intracranial mass effect. Favor treatment effect/Radiation Necrosis, as the three additional lesions are all stable or smaller, and no new metastasis is identified. 2. No new intracranial abnormality. Electronically Signed   By: Odessa Fleming M.D.    On: 05/10/2023 12:23

## 2023-06-17 NOTE — Assessment & Plan Note (Signed)
Durvalumab treatment as planned.

## 2023-06-17 NOTE — Patient Instructions (Signed)
[facility] - A DEPT OF [facility]  Discharge Instructions: Thank you for choosing [facility] to provide your oncology and hematology care.  If you have a lab appointment with the Cancer Center, please go directly to the Cancer Center and check in at the registration area.  Wear comfortable clothing and clothing appropriate for easy access to any Portacath or PICC line.   We strive to give you quality time with your provider. You may need to reschedule your appointment if you arrive late (15 or more minutes).  Arriving late affects you and other patients whose appointments are after yours.  Also, if you miss three or more appointments without notifying the office, you may be dismissed from the clinic at the provider's discretion.      For prescription refill requests, have your pharmacy contact our office and allow 72 hours for refills to be completed.    Today you received the following chemotherapy and/or immunotherapy agents Imfinzi      To help prevent nausea and vomiting after your treatment, we encourage you to take your nausea medication as directed.  BELOW ARE SYMPTOMS THAT SHOULD BE REPORTED IMMEDIATELY: *FEVER GREATER THAN 100.4 F (38 C) OR HIGHER *CHILLS OR SWEATING *NAUSEA AND VOMITING THAT IS NOT CONTROLLED WITH YOUR NAUSEA MEDICATION *UNUSUAL SHORTNESS OF BREATH *UNUSUAL BRUISING OR BLEEDING *URINARY PROBLEMS (pain or burning when urinating, or frequent urination) *BOWEL PROBLEMS (unusual diarrhea, constipation, pain near the anus) TENDERNESS IN MOUTH AND THROAT WITH OR WITHOUT PRESENCE OF ULCERS (sore throat, sores in mouth, or a toothache) UNUSUAL RASH, SWELLING OR PAIN  UNUSUAL VAGINAL DISCHARGE OR ITCHING   Items with * indicate a potential emergency and should be followed up as soon as possible or go to the Emergency Department if any problems should occur.  Please show the CHEMOTHERAPY ALERT CARD or IMMUNOTHERAPY ALERT  CARD at check-in to the Emergency Department and triage nurse.  Should you have questions after your visit or need to cancel or reschedule your appointment, please contact [facility] - A DEPT OF [facility]  [phone] and follow the prompts.  Office hours are 8:00 a.m. to 4:30 p.m. Monday - Friday. Please note that voicemails left after 4:00 p.m. may not be returned until the following business day.  We are closed weekends and major holidays. You have access to a nurse at all times for urgent questions. Please call the main number to the clinic [phone] and follow the prompts.  For any non-urgent questions, you may also contact your provider using MyChart. We now offer e-Visits for anyone 87 and older to request care online for non-urgent symptoms. For details visit [email or web address].   Also download the MyChart app! Go to the app store, search "MyChart", open the app, select [place], and log in with your MyChart username and password.

## 2023-06-18 LAB — T4: T4, Total: 9.9 ug/dL (ref 4.5–12.0)

## 2023-06-25 ENCOUNTER — Ambulatory Visit
Admission: RE | Admit: 2023-06-25 | Discharge: 2023-06-25 | Disposition: A | Discharge status: Home / Self Care | Payer: Medicare Other | Source: Ambulatory Visit | Attending: Oncology | Admitting: Oncology

## 2023-06-25 ENCOUNTER — Other Ambulatory Visit: Payer: Medicare Other

## 2023-06-25 ENCOUNTER — Ambulatory Visit: Payer: Medicare Other

## 2023-06-25 ENCOUNTER — Ambulatory Visit: Payer: Medicare Other | Admitting: Oncology

## 2023-06-25 DIAGNOSIS — C3491 Malignant neoplasm of unspecified part of right bronchus or lung: Secondary | ICD-10-CM | POA: Insufficient documentation

## 2023-06-25 MED ORDER — IOHEXOL 300 MG/ML  SOLN
100.0000 mL | Freq: Once | INTRAMUSCULAR | Status: AC | PRN
  Administered 2023-06-25: 100 mL via INTRAVENOUS

## 2023-06-30 ENCOUNTER — Encounter: Payer: Self-pay | Admitting: Pulmonary Disease

## 2023-06-30 ENCOUNTER — Ambulatory Visit (INDEPENDENT_AMBULATORY_CARE_PROVIDER_SITE_OTHER): Payer: Medicare Other | Admitting: Pulmonary Disease

## 2023-06-30 VITALS — BP 112/62 | HR 94 | Temp 96.9°F | Ht 71.0 in | Wt 214.2 lb

## 2023-06-30 DIAGNOSIS — J984 Other disorders of lung: Secondary | ICD-10-CM

## 2023-06-30 DIAGNOSIS — J439 Emphysema, unspecified: Secondary | ICD-10-CM

## 2023-06-30 DIAGNOSIS — J4489 Other specified chronic obstructive pulmonary disease: Secondary | ICD-10-CM | POA: Diagnosis not present

## 2023-06-30 DIAGNOSIS — C3491 Malignant neoplasm of unspecified part of right bronchus or lung: Secondary | ICD-10-CM | POA: Diagnosis not present

## 2023-06-30 DIAGNOSIS — R0602 Shortness of breath: Secondary | ICD-10-CM | POA: Diagnosis not present

## 2023-06-30 NOTE — Progress Notes (Unsigned)
Subjective:    Patient ID: Joshua [name], male    DOB: 1956/01/10, 67 y.o.   MRN: [id number]  Patient Care Team: Jacky Kindle, FNP as PCP - General (Family Medicine) Patrice Paradise, MD as Referring Physician (Physician Assistant) Lemar Livings, Merrily Pew, MD (General Surgery) Glory Buff, RN as Oncology Nurse Navigator [name], MD as Consulting Physician (Oncology)  Chief Complaint  Patient presents with   Follow-up    DOE. Little wheezing. No cough.     BACKGROUND/INTERVAL:Patient is a 67 year old former smoker (45 PY) with a history of COPD, shortness of breath on exertion and squamous cell carcinoma of the right upper lobe, stage IV disease who presents for follow-up.  He was last seen on 04 February 2023.  He does not use inhalers because he does not note any significant improvement with them.  He has chronic dyspnea that is unchanged.  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  HPI Discussed the use of AI scribe software for clinical note transcription with the patient, who gave verbal consent to proceed.  History of Present Illness   The patient, with a history of lung cancer, presents for a follow-up visit after a recent CT scan. The patient completed radiation therapy for brain mets in April. He reports persistent shortness of breath, which has not changed significantly. The dyspnea is not exacerbated by activity, but the patient notes he is unable to engage in fast-paced activities. This is a chronic problem. He finds no relief from inhalers.  The patient has a long history of smoking, spanning 45 years, and has experienced breathlessness for the past 20 years. He has been using inhalers but reports no significant improvement in his symptoms. The patient still has two  emergency inhalers on hand for potential wheezing episodes.  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     Does not endorse any other symptomatology.  DATA 07/18/2021 PFTs: FEV1 2.80 L or 76% predicted, FVC 3.61 L or 74% predicted, there is a small airways component. No significant bronchodilator response.  Lung volumes mildly reduced.  Diffusion capacity 71% by Kco. 10/14/2022 PFTs: FEV1 2.49 L or 69% predicted, FVC 3.29 L or 68% predicted, FEV1/FVC 76%, lung volumes are moderately reduced.  Diffusion capacity moderately reduced.  There is a minimal/mild obstructive component that may be underestimated due to the degree of restriction.  Overall mild decline in lung function compared to 2022 study.  Review of Systems A 10 point review of systems was performed and it is as noted above otherwise negative.   Patient Active Problem List   Diagnosis Date Noted   Elevated blood pressure reading in office without diagnosis of hypertension 06/02/2023   Avitaminosis D 06/02/2023   Benign localized prostatic hyperplasia without lower urinary tract symptoms (LUTS) 06/02/2023   Chemotherapy-induced neutropenia (HCC) 06/02/2023   Thrombocytopenia (HCC) 06/02/2023   Elevated serum creatinine 05/28/2023   COPD (chronic obstructive pulmonary disease) (HCC) 12/28/2021   Malignant neoplasm metastatic to brain (HCC) 10/08/2021   Hypothyroidism, unspecified 10/08/2021   Parotid nodule 08/16/2021   Organizing pneumonia (HCC) 04/04/2021   Hypothyroidism due to medication 04/04/2021   Encounter for antineoplastic immunotherapy 04/04/2021   Antineoplastic chemotherapy induced anemia 10/17/2020   Squamous cell  carcinoma of right lung (HCC) 09/20/2020   Iron deficiency anemia 08/30/2020   Cervical myelopathy (HCC) 03/14/2020   Peripheral neuropathy 10/14/2019   Chronic left-sided low back pain without sciatica 04/13/2019    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) 11/18/2016   Complication associated with vascular device 11/13/2016   Hyperlipidemia, unspecified 06/18/2016   Radiculopathy of lumbar region 06/10/2016   PVD (peripheral vascular disease) (HCC) 05/08/2016   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 Swallow whole.   clopidogrel (PLAVIX) 75 MG tablet TAKE 1 TABLET BY MOUTH DAILY   levothyroxine (SYNTHROID) 125 MCG tablet Take 1 tablet (125 mcg total) by mouth daily before breakfast.   lisinopril (ZESTRIL) 20 MG tablet Take 1 tablet (20 mg total) by mouth daily.   metFORMIN (GLUCOPHAGE-XR) 750 MG 24 hr tablet Take 1 tablet (750 mg total) by mouth daily with breakfast.   potassium chloride (KLOR-CON M) 10 MEQ tablet Take 1 tablet (10 mEq total) by mouth 2 (two) times daily.   rosuvastatin (CRESTOR) 40 MG tablet Take 1 tablet (40 mg total) by mouth daily.   Vitamin D, Ergocalciferol, (DRISDOL) 1.25 MG (50000 UNIT) CAPS capsule Take 1 capsule (50,000 Units total) by mouth every 7 (seven) days.    Immunization History  Administered Date(s) Administered   Influenza,inj,Quad PF,6+ Mos 09/14/2016, 04/13/2019, 04/16/2020   Influenza-Unspecified 09/14/2016, 04/13/2019, 04/16/2020, 04/02/2022, 03/25/2023   PFIZER(Purple Top)SARS-COV-2 Vaccination 10/20/2019, 11/29/2019   Tdap 04/16/2020        Objective:     BP 112/62 (BP  Location: Right Arm, Cuff Size: Large)   Pulse 94   Temp (!) 96.9 F (36.1 C)   Ht 5\' 11"  (1.803 m)   Wt 214 lb 3.2 oz (97.2 kg)   SpO2 99%   BMI 29.87 kg/m   SpO2: 99 % O2 Device: None (Room air)  GENERAL: Well-developed well-nourished gentleman, no tachypnea.  No conversational dyspnea.  Ambulatory with assistance of cane.  HEAD: Normocephalic, atraumatic.  EYES: Pupils equal, round, reactive to light.  No scleral icterus.  MOUTH: Teeth intact, caps noted.  Oral mucosa moist, no thrush. NECK: Supple.  Limited ROM due to prior spinal fusion.  No thyromegaly. Trachea midline. No JVD.  No adenopathy. PULMONARY: Good air entry bilaterally.  No adventitious sounds. CARDIOVASCULAR: S1 and S2. Regular rate and rhythm.  No rubs, murmurs or gallops heard. ABDOMEN: Benign. MUSCULOSKELETAL: No joint deformity, no clubbing, no edema.  NEUROLOGIC: Mild psychomotor retardation noted, mild unsteady gait. SKIN: Intact,warm,dry. PSYCH: Mood and behavior normal.   Assessment & Plan:     ICD-10-CM   1. COPD with chronic bronchitis and emphysema (HCC)  J44.89    J43.9    Uses albuterol as needed Does not note relief from maintenance inhalers    2. Squamous cell carcinoma of right lung (  HCC)  C34.91    Continues to follow with oncology Stage IV disease    3. SOB (shortness of breath)  R06.02    Neck issue, no relief from inhalers    4. Pneumonitis - RESOLVED  J98.4    Due to checkpoint inhibitor      Discussion:    Chronic Obstructive Pulmonary Disease (COPD) Chronic shortness of breath, unchanged. 45-year smoking history. Infrequent use of emergency inhalers. Physical exam reveals clear lung sounds. Discussed limited benefit of inhalers and importance of use if wheezing occurs. Explained shortness of breath likely due to long-term smoking and COPD. - Advise use of emergency inhaler if wheezing occurs - Follow-up in six months or PRN  Lung Cancer Treated with debridement,  chemotherapy and radiation. Recent imaging shows no chest recurrence. MRI of the brain scheduled for July 03, 2023, to rule out metastasis. Completed radiation therapy in April. Discussed positive outcome of chest scan and anticipated MRI results. - Perform MRI of the brain on July 03, 2023 - Follow-up in six months  General Health Maintenance Due for pneumonia vaccine (Prevnar 20) up to date on flu vaccine. Pneumonia vaccine unavailable due to clinic refrigeration issue. Discussed importance of Prevnar 20 as a lifetime shot. - Recommend getting Prevnar 20 vaccine at the cancer center or [facility] - Up to date on flu vaccine - Follow-up in six months or prn.     Advised if symptoms do not improve or worsen, to please contact office for sooner follow up or seek emergency care.    I spent 35 minutes of dedicated to the care of this patient on the date of this encounter to include pre-visit review of records, face-to-face time with the patient discussing conditions above, post visit ordering of testing, clinical documentation with the electronic health record, making appropriate referrals as documented, and communicating necessary findings to members of the patients care team.     C. Danice Goltz, MD Advanced Bronchoscopy [facility]    *This note was generated using voice recognition software/Dragon and/or AI transcription program.  Despite best efforts to proofread, errors can occur which can change the meaning. Any transcriptional errors that result from this process are unintentional and may not be fully corrected at the time of dictation.

## 2023-06-30 NOTE — Patient Instructions (Signed)
VISIT SUMMARY:  You came in today for a follow-up visit after your recent CT scan. We discussed your ongoing shortness of breath and reviewed your history of smoking and its impact on your lung health. We also reviewed your recent treatment for lung cancer and discussed your upcoming MRI. Additionally, we talked about your vaccination needs.  YOUR PLAN:  -CHRONIC OBSTRUCTIVE PULMONARY DISEASE (COPD): COPD is a chronic lung disease often caused by long-term smoking, leading to breathing difficulties. Your shortness of breath remains unchanged, and while inhalers have limited benefit, they should be used if you experience wheezing. We will follow up in six months to monitor your condition.  -LUNG CANCER: Your lung cancer has been treated with debridement , chemotherapy and radiation, and recent imaging shows no recurrence in the chest. An MRI of your brain is scheduled for July 03, 2023, to ensure the cancer has not spread. We will follow up in six months to review the MRI results.  -GENERAL HEALTH MAINTENANCE: You are due for the Prevnar 20 pneumonia vaccine and the flu vaccine. The pneumonia vaccine is a lifetime shot, and you can get it at the cancer center or [facility]. Your flu vaccine is up to date. We will follow up in six months.  INSTRUCTIONS:  Please use your emergency inhaler if you experience wheezing. Ensure you get the Prevnar 20 pneumonia vaccine at the cancer center or [facility]. Your MRI of the brain is scheduled for July 03, 2023. We will see you again in six months to review your progress and results.

## 2023-07-01 ENCOUNTER — Other Ambulatory Visit: Payer: Self-pay

## 2023-07-01 ENCOUNTER — Encounter: Payer: Self-pay | Admitting: Pulmonary Disease

## 2023-07-03 ENCOUNTER — Inpatient Hospital Stay: Payer: Medicare Other | Attending: Oncology

## 2023-07-03 ENCOUNTER — Inpatient Hospital Stay (HOSPITAL_BASED_OUTPATIENT_CLINIC_OR_DEPARTMENT_OTHER): Payer: Medicare Other | Admitting: Oncology

## 2023-07-03 ENCOUNTER — Ambulatory Visit
Admission: RE | Admit: 2023-07-03 | Discharge: 2023-07-03 | Disposition: A | Discharge status: Home / Self Care | Payer: Medicare Other | Source: Ambulatory Visit | Attending: Internal Medicine

## 2023-07-03 ENCOUNTER — Encounter: Payer: Self-pay | Admitting: Oncology

## 2023-07-03 ENCOUNTER — Inpatient Hospital Stay: Payer: Medicare Other

## 2023-07-03 VITALS — BP 127/68 | HR 77 | Temp 96.0°F | Resp 18 | Ht 71.0 in | Wt 213.0 lb

## 2023-07-03 VITALS — BP 109/60 | HR 72 | Temp 97.2°F | Resp 19

## 2023-07-03 DIAGNOSIS — E032 Hypothyroidism due to medicaments and other exogenous substances: Secondary | ICD-10-CM | POA: Diagnosis not present

## 2023-07-03 DIAGNOSIS — Z5112 Encounter for antineoplastic immunotherapy: Secondary | ICD-10-CM | POA: Insufficient documentation

## 2023-07-03 DIAGNOSIS — C7931 Secondary malignant neoplasm of brain: Secondary | ICD-10-CM | POA: Insufficient documentation

## 2023-07-03 DIAGNOSIS — Z79620 Long term (current) use of immunosuppressive biologic: Secondary | ICD-10-CM | POA: Insufficient documentation

## 2023-07-03 DIAGNOSIS — C3491 Malignant neoplasm of unspecified part of right bronchus or lung: Secondary | ICD-10-CM

## 2023-07-03 DIAGNOSIS — C3411 Malignant neoplasm of upper lobe, right bronchus or lung: Secondary | ICD-10-CM | POA: Diagnosis present

## 2023-07-03 LAB — CBC WITH DIFFERENTIAL/PLATELET
Abs Immature Granulocytes: 0.01 10*3/uL (ref 0.00–0.07)
Basophils Absolute: 0.1 10*3/uL (ref 0.0–0.1)
Basophils Relative: 1 %
Eosinophils Absolute: 0.2 10*3/uL (ref 0.0–0.5)
Eosinophils Relative: 3 %
HCT: 40.4 % (ref 39.0–52.0)
Hemoglobin: 13.3 g/dL (ref 13.0–17.0)
Immature Granulocytes: 0 %
Lymphocytes Relative: 17 %
Lymphs Abs: 1 10*3/uL (ref 0.7–4.0)
MCH: 27.2 pg (ref 26.0–34.0)
MCHC: 32.9 g/dL (ref 30.0–36.0)
MCV: 82.6 fL (ref 80.0–100.0)
Monocytes Absolute: 0.5 10*3/uL (ref 0.1–1.0)
Monocytes Relative: 9 %
Neutro Abs: 4.1 10*3/uL (ref 1.7–7.7)
Neutrophils Relative %: 70 %
Platelets: 214 10*3/uL (ref 150–400)
RBC: 4.89 MIL/uL (ref 4.22–5.81)
RDW: 15.3 % (ref 11.5–15.5)
WBC: 5.9 10*3/uL (ref 4.0–10.5)
nRBC: 0 % (ref 0.0–0.2)

## 2023-07-03 LAB — COMPREHENSIVE METABOLIC PANEL
ALT: 20 U/L (ref 0–44)
AST: 22 U/L (ref 15–41)
Albumin: 4 g/dL (ref 3.5–5.0)
Alkaline Phosphatase: 83 U/L (ref 38–126)
Anion gap: 9 (ref 5–15)
BUN: 17 mg/dL (ref 8–23)
CO2: 24 mmol/L (ref 22–32)
Calcium: 9.2 mg/dL (ref 8.9–10.3)
Chloride: 104 mmol/L (ref 98–111)
Creatinine, Ser: 1.07 mg/dL (ref 0.61–1.24)
GFR, Estimated: >60 mL/min (ref >60)
Glucose, Bld: 130 mg/dL — ABNORMAL HIGH (ref 70–99)
Potassium: 4.1 mmol/L (ref 3.5–5.1)
Sodium: 137 mmol/L (ref 135–145)
Total Bilirubin: 0.4 mg/dL (ref <1.2)
Total Protein: 7 g/dL (ref 6.5–8.1)

## 2023-07-03 LAB — TSH: TSH: 11.547 u[IU]/mL — ABNORMAL HIGH (ref 0.350–4.500)

## 2023-07-03 MED ORDER — SODIUM CHLORIDE 0.9 % IV SOLN
Freq: Once | INTRAVENOUS | Status: AC
  Filled 2023-07-03: qty 250

## 2023-07-03 MED ORDER — HEPARIN SOD (PORK) LOCK FLUSH 100 UNIT/ML IV SOLN
500.0000 [IU] | Freq: Once | INTRAVENOUS | Status: AC | PRN
  Administered 2023-07-03: 500 [IU]
  Filled 2023-07-03: qty 5

## 2023-07-03 MED ORDER — HEPARIN SOD (PORK) LOCK FLUSH 100 UNIT/ML IV SOLN: 500.0000 [IU] | Freq: Once | INTRAVENOUS | Status: DC

## 2023-07-03 MED ORDER — GADOBUTROL 1 MMOL/ML IV SOLN
9.0000 mL | Freq: Once | INTRAVENOUS | Status: AC | PRN
  Administered 2023-07-03: 9 mL via INTRAVENOUS

## 2023-07-03 MED ORDER — SODIUM CHLORIDE 0.9 % IV SOLN
10.0000 mg/kg | Freq: Once | INTRAVENOUS | Status: AC
  Administered 2023-07-03: 1000 mg via INTRAVENOUS
  Filled 2023-07-03: qty 20

## 2023-07-03 MED ORDER — HEPARIN SOD (PORK) LOCK FLUSH 100 UNIT/ML IV SOLN
INTRAVENOUS | Status: AC
  Filled 2023-07-03: qty 5

## 2023-07-03 NOTE — Assessment & Plan Note (Signed)
Durvalumab treatment as planned.  

## 2023-07-03 NOTE — Progress Notes (Signed)
Hematology/Oncology Progress note [phone] [phone]) [phone]    CHIEF COMPLAINTS/REASON FOR VISIT:  Follow up for lung cancer   ASSESSMENT & PLAN:   Cancer Staging  Squamous cell carcinoma of right lung (HCC) Staging form: Lung, AJCC 8th Edition - Clinical stage from 09/20/2020: Stage IV (cT1b, cN2, cM1) - Signed by [name], MD on 12/13/2021   Squamous cell carcinoma of right lung (HCC) Durvalumab was previously held due to pneumonitis.  May 2024 CT scan showed Improving bilateral scattered interstitial and ground-glass opacities. Discussed with pulmonology Dr. Jayme Cloud. 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  Labs are reviewed and discussed with patient. Proceed with  Durvalumab.  Repeat CT showed- stable disease  Encounter for antineoplastic immunotherapy Durvalumab treatment as planned.   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 Mitzi Hansen.  Follow up with Dr. Barbaraann Cao. -repeat MRI brain     Follow up 1w lab MD Durvalumab All questions were answered. The patient knows to call the clinic with any problems, questions or concerns.  [name], MD, PhD [facility] Hematology Oncology 07/03/2023       HISTORY OF PRESENTING ILLNESS:  Joshua [name] is a 67 y.o. male who has above history reviewed by me today presents for follow up visit for Stage IV lung squamous cell  carcinoma. Oncology History  Squamous cell carcinoma of right lung (HCC)  09/18/2020 Imaging   PET scan showed right upper lobe nodule maximal SUV 11.5.  Hypermetabolic cluster right paratracheal adenopathy with maximum SUV up to 12.  No findings of metastatic disease to the neck/abdomen/pelvis or skeleton.   09/20/2020 Initial Diagnosis   Squamous cell carcinoma of right lung (HCC) NGS showed PD-L1 TPS 95%, LRP1B S976fs, PIK3CA E545K, RB1 c1422-2A>T, RIT1 T83_ A84del, STK11 P268fs, TPS53 R248W, TMB 19.86mut/mb, MS stable.    09/20/2020 Cancer Staging   Staging form: Lung, AJCC 8th Edition - Clinical stage from 09/20/2020: Stage IV (cT1b, cN2, cM1) - Signed by [name], MD on 12/13/2021   10/03/2020 Imaging   MRI brain with and without contrast showed no evidence of intracranial metastatic disease. Well-defined round lesion within the left parotid tail measuring approximately 2.3 x 1.8 x 1.8 cm   10/09/2020 - 11/27/2020 Radiation Therapy   concurrent chemoradiation   10/10/2020 - 10/31/2020 Chemotherapy   10/10/2020 concurrent chemoradiation.  carboplatin AUC of 2 and Taxol 45 mg/m2 x 4.  Additional chemotherapy was held due to thrombocytopenia, neutropenia       12/31/2020 - 03/21/2022 Chemotherapy   Maintenance  Durvalumab q14d      12/31/2020 -  Chemotherapy   Patient is on Treatment Plan : LUNG Durvalumab (10) q14d     03/28/2021 Imaging   CT without contrast showed new linear right perihilar consolidation with associated traction bronchiectasis.  Likely postradiation changes.  Additional new peribronchial vascular/nodular groundglass opacities with areas of consolidation or seen primarily in the right upper and lower lobes, more distant from expected radiation fields. Findings are possibly due to radiation-induced  organizing pneumonia   03/28/2021 Adverse Reaction   Durvalumab was held for possible  pneumonitis.  Per my discussion with pulmonology Dr. 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     05/09/2021 Imaging   Repeat CT chest without contrast showed evolving radiation changes in the right perihilar region likely accounting for interval increase to thickening of the posterior wall of the right upper lobe bronchus.  The more peripheral right lung groundglass opacities seen on most recent study have generally improved with exception of one peripherally right upper lobe which appears new.  These are likely inflammatory.  No findings highly suspicious for local recurrence.   05/30/2021 - 01/10/2022 Chemotherapy   Durvalumab q14d     10/07/2021 Imaging   MRI brain with and without contrast showed 2 contrast-enhancing lesions, most consistent with metastatic disease.  Largest lesion is in the left frontal lobe and measures up to 1.3 cm with a large amount of surrounding edema and 9 mm rightward midline shift.  The other enhancing lesion was 4 mm in the right occipital lobe with a small amount of surrounding edema.  No acute or chronic hemorrhage.   10/07/2021 Progression   Headache after colonoscopy, progressive difficulty using her right arm.  Progressively worsening of speech. Stage IV lung cancer with brain metastasis.  started on Dexamethasone.  He was seen by neurosurgery and radiation oncology.  Recommendation is to proceed with SRS.  Patient was discharged with dexamethasone   10/16/2021 Imaging   PET scan showed postradiation changes in the right supra hilar lung without evidence of local recurrence.  No evidence of lung cancer recurrence or metastatic disease.  Single focus of intense metabolic activity associated with posterior left 11th rib likely secondary to recent fall/posttraumatic metabolic changes.  Frontal  uptake in the left frontal lobe corresponding to the known metastatic lesion in the brain   10/29/2021 -  Radiation Therapy   Brain Radiation [facility]   12/10/2021 Imaging   Brain MRI: Continued interval decrease in size of a left frontal lobe  metastasis, now measuring 10 mm. Moderate surrounding edema has also decreased.  3-4 mm metastasis within the right occipital lobe, not significantly changed in size. There is now a small focus of central non enhancement within this lesion. Increased edema. 3 mm enhancing metastasis within the high left parietal lobe. In retrospect, this was likely present as a subtle punctate focus of enhancement on the prior brain MRI of 10/22/2021.   01/18/2022 Imaging   CT chest abdomen pelvis w contrast 1. Stable post treatment changes in the right upper lobe medially with dense radiation fibrosis but no findings to suggest local recurrent tumor. 2. Significant progression of ill-defined ground-glass opacity, interstitial thickening and airspace nodularity in the right upper lobe and upper aspect of the right lower lobe. Findings could reflect an inflammatory or atypical infectious process, drug induced pneumonitis or interstitial spread of tumor.3. No mediastinal or hilar mass or adenopathy. 4. No findings for abdominal/pelvic metastatic disease or osseous metastatic disease.   02/19/2022 Imaging   MRI brain w wo contrast showed Three metastatic deposits in the brain appear smaller. Decreased edema in the left parietal lobe. No new lesions    04/04/2022 Imaging   CT chest w contrast 1. Stable radiation changes in the right perihilar region without evidence of local recurrence. 2. Interval improvement in the patchy ground-glass opacities previously demonstrated in both lungs, most consistent with a resolving infectious or inflammatory process (including immunotherapy-related pneumonitis). 3. No  evidence of metastatic disease.   05/14/2022 Miscellaneous   He had a mechanical fall and fractured his left elbow. xrays showed a left elbow fracture  05/26/22 seen by orthopedic surgeon, cast applied.    05/27/2022 Imaging   MRI brain w wo contrast 1. All three known brain metastases have enlarged since August, with  increasing regional edema. And there is a small new left anterior cerebellar 4-5 mm metastasis (was questionably punctate on the May MRI). This constellation could reflect a combination of True Progression (Left Cerebellar lesion) and pseudoprogression/radionecrosis (3 existing mets).    2. No significant intracranial mass effect. 3. Underlying moderate cerebral white matter signal changes.   05/30/2022 Imaging   CT chest abdomen pelvis w contrast  1. 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 There is a stable small right pleural effusion, but no definitive findings to suggest residual or recurrent disease on today's examination. 2. No signs of metastatic disease in the abdomen or pelvis.  3. Aortic atherosclerosis, in addition to two-vessel coronary artery disease. Please note that although the presence of coronary artery calcium documents the presence of coronary artery disease, the severity of this disease and any potential stenosis cannot be assessed on this non-gated CT examination. Assessment for potential risk factor modification, dietary therapy or pharmacologic therapy may be warranted, if clinically indicated. 4. Hepatic steatosis. 5. Colonic diverticulosis without evidence of acute diverticulitis at this time. 6. Incompletely imaged but apparently occluded vascular graft in the superior aspect of the right anterior thigh. Referral to vascular specialist for further clinical evaluation should be considered if clinically appropriate. 7. Additional incidental findings, as above.   07/22/2022 Imaging   MRI brain  1. No new or enlarging lesions. No intracranial disease progression. 2. Decreased size of treated lesions of the left frontoparietal junction and left postcentral gyrus. 3. Unchanged lesions in the left cerebellum and right occipital lobe.   10/15/2022 Imaging    MRI brain w wo contrast showed Interval increase in size of multiple intraparenchymal lesions with worsened surrounding vasogenic edema. 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 No new lesions   11/05/2022 Imaging   MRI brain w wo contrast  1. Likely slight increase and right occipital and left cerebellar lesions. Otherwise, unchanged metastases, as above. No new lesions identified. 2. Partially imaged large probable lymph node in the left upper neck, apparent on prior PET CT.   11/13/2022 -  Radiation Therapy   Each of the sites were treated with SRS/SBRT/SRT-IMRT technique to 20 Gy in 1 fraction to each of the following sites: PTV_2_LCerebel_83mm PTV_3_RCerebel_65mm PTV_4_ROccip_66mm PTV_5_LParietal_42mm   11/27/2022 Imaging   CT chest abdomen pelvis w contrast showed  Improving bilateral scattered interstitial and ground-glass opacities. Small amount of residual areas in the extreme left lower lobe inferiorly. This specific area is slightly increased from previous but overall the amount of opacities is significantly improved. Recommend continued follow-up. No developing new mass lesion, fluid collection or lymph node enlargement. Stable posttreatment changes right perihilar in the lung. Fatty liver infiltration. Colonic diverticulosis. Improved tiny right pleural effusion   02/05/2023 Imaging   MRI brain with and without contrast Interval decrease in all of the metastatic lesions, detailed above.No new lesions identified.    Imaging   CT chest abdomen pelvis w contrast showed  1. Similar right perihilar and central upper lobe radiation fibrosis, without recurrent or metastatic  disease. 2. Trace right pleural fluid or thickening, new or minimally increased. 3. Incidental findings, including: Aortic atherosclerosis (ICD10-I70.0), coronary artery atherosclerosis and emphysema (ICD10-J43.9). Hepatic steatosis.    06/25/2023 Imaging   CT chest abdomen pelvis w contrast showed 1. Unchanged post treatment/post radiation appearance of the right lung, with perihilar and suprahilar paramedian fibrosis and consolidation. 2. No evidence of lymphadenopathy or metastatic disease in the chest, abdomen, or pelvis. 3. Hepatic steatosis. 4. Coronary artery disease.   Aortic Atherosclerosis (ICD10-I70.0) and Emphysema (ICD10-J43.9).    Today he feels well.Intermittent headache, is better. denies any focal deficits. chronic SOB,not worse.  He denies any new complaint.    Review of Systems  Constitutional:  Positive for fatigue. Negative for appetite change, chills, fever and unexpected weight change.  HENT:   Negative for hearing loss and voice change.   Eyes:  Negative for eye problems and icterus.  Respiratory:  Negative for chest tightness, cough and shortness of breath.   Cardiovascular:  Negative for chest pain and leg swelling.  Gastrointestinal:  Negative for abdominal distention and abdominal pain.  Endocrine: Negative for hot flashes.  Genitourinary:  Negative for difficulty urinating, dysuria and frequency.   Musculoskeletal:  Negative for arthralgias.  Skin:  Negative for itching and rash.  Neurological:  Negative for extremity weakness, headaches, light-headedness, numbness and speech difficulty.  Hematological:  Negative for adenopathy. Does not bruise/bleed easily.  Psychiatric/Behavioral:  Negative for confusion.       MEDICAL HISTORY:  Past Medical History:  Diagnosis Date   Allergy    Cancer (HCC)    lung   COPD (chronic obstructive pulmonary disease) (HCC)    Dyspnea    former smoker. 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   Peripheral vascular disease (HCC)    Pre-diabetes    Sleep apnea     SURGICAL HISTORY: Past Surgical History:  Procedure Laterality Date   ANTERIOR CERVICAL DECOMP/DISCECTOMY FUSION N/A  03/14/2020   Procedure: ANTERIOR CERVICAL DECOMPRESSION/DISCECTOMY FUSION 3 LEVELS C4-7;  Surgeon: Venetia Night, MD;  Location: [facility] ORS;  Service: Neurosurgery;  Laterality: N/A;   BACK SURGERY  2011   CENTRAL LINE INSERTION Right 09/10/2016   Procedure: CENTRAL LINE INSERTION;  Surgeon: Renford Dills, MD;  Location: [facility] ORS;  Service: Vascular;  Laterality: Right;   CERVICAL FUSION     C4-c7 on Mar 14, 2020   COLONOSCOPY  2013 ?   COLONOSCOPY N/A 09/09/2021   Procedure: COLONOSCOPY;  Surgeon: Jaynie Collins, DO;  Location: [facility] ENDOSCOPY;  Service: Gastroenterology;  Laterality: N/A;   CORONARY ANGIOPLASTY     ESOPHAGOGASTRODUODENOSCOPY N/A 09/09/2021   Procedure: ESOPHAGOGASTRODUODENOSCOPY (EGD);  Surgeon: Jaynie Collins, DO;  Location: [facility] ENDOSCOPY;  Service: Gastroenterology;  Laterality: N/A;   FEMORAL-POPLITEAL BYPASS GRAFT Right 09/10/2016   Procedure: BYPASS GRAFT FEMORAL-POPLITEAL ARTERY ( BELOW KNEE );  Surgeon: Renford Dills, MD;  Location: [facility] ORS;  Service: Vascular;  Laterality: Right;   FEMORAL-TIBIAL BYPASS GRAFT Right 03/23/2020   Procedure: BYPASS GRAFT RIGHT FEMORAL- DISTAL TIBIAL ARTERY WITH DISTAL FLOW GRAFT;  Surgeon: Renford Dills, MD;  Location: [facility] ORS;  Service: Vascular;  Laterality: Right;   LEFT HEART CATH AND CORONARY ANGIOGRAPHY Left 05/16/2021   Procedure: LEFT HEART CATH AND CORONARY ANGIOGRAPHY;  Surgeon: Armando Reichert, MD;  Location[facility] INVASIVE CV LAB;  Service: Cardiovascular;  Laterality: Left;   LOWER EXTREMITY ANGIOGRAPHY Right 11/18/2016  Procedure: Lower Extremity Angiography;  Surgeon: Renford Dills, MD;  Location: [facility] INVASIVE CV LAB;  Service: Cardiovascular;  Laterality: Right;   LOWER EXTREMITY ANGIOGRAPHY Right 12/09/2016   Procedure: Lower Extremity Angiography;  Surgeon: Renford Dills, MD;  Location: [facility] INVASIVE CV LAB;  Service: Cardiovascular;  Laterality: Right;   LOWER EXTREMITY  ANGIOGRAPHY Right 11/15/2019   Procedure: LOWER EXTREMITY ANGIOGRAPHY;  Surgeon: Renford Dills, MD;  Location: [facility] INVASIVE CV LAB;  Service: Cardiovascular;  Laterality: Right;   LOWER EXTREMITY ANGIOGRAPHY Right 12/01/2019   Procedure: Lower Extremity Angiography;  Surgeon: Annice Needy, MD;  Location: [facility] INVASIVE CV LAB;  Service: Cardiovascular;  Laterality: Right;   LOWER EXTREMITY ANGIOGRAPHY Right 12/02/2019   Procedure: LOWER EXTREMITY ANGIOGRAPHY;  Surgeon: Renford Dills, MD;  Location: [facility] INVASIVE CV LAB;  Service: Cardiovascular;  Laterality: Right;   LOWER EXTREMITY ANGIOGRAPHY Right 03/23/2020   Procedure: Lower Extremity Angiography;  Surgeon: Renford Dills, MD;  Location: [facility] INVASIVE CV LAB;  Service: Cardiovascular;  Laterality: Right;   LOWER EXTREMITY ANGIOGRAPHY Right 09/25/2021   Procedure: Lower Extremity Angiography;  Surgeon: Renford Dills, MD;  Location: [facility] INVASIVE CV LAB;  Service: Cardiovascular;  Laterality: Right;   LOWER EXTREMITY ANGIOGRAPHY Right 09/26/2021   Procedure: Lower Extremity Angiography;  Surgeon: Annice Needy, MD;  Location: [facility] INVASIVE CV LAB;  Service: Cardiovascular;  Laterality: Right;   LOWER EXTREMITY ANGIOGRAPHY Right 04/08/2022   Procedure: Lower Extremity Angiography;  Surgeon: Renford Dills, MD;  Location: [facility] INVASIVE CV LAB;  Service: Cardiovascular;  Laterality: Right;   LOWER EXTREMITY INTERVENTION  11/18/2016   Procedure: Lower Extremity Intervention;  Surgeon: Renford Dills, MD;  Location: [facility] INVASIVE CV LAB;  Service: Cardiovascular;;   PERIPHERAL VASCULAR CATHETERIZATION Right 01/02/2015   Procedure: Lower Extremity Angiography;  Surgeon: Renford Dills, MD;  Location: [facility] INVASIVE CV LAB;  Service: Cardiovascular;  Laterality: Right;   PERIPHERAL VASCULAR CATHETERIZATION Right 06/18/2015   Procedure: Lower Extremity Angiography;  Surgeon: Annice Needy, MD;  Location: [facility] INVASIVE CV LAB;  Service:  Cardiovascular;  Laterality: Right;   PERIPHERAL VASCULAR CATHETERIZATION  06/18/2015   Procedure: Lower Extremity Intervention;  Surgeon: Annice Needy, MD;  Location: [facility] INVASIVE CV LAB;  Service: Cardiovascular;;   PERIPHERAL VASCULAR CATHETERIZATION N/A 10/09/2015   Procedure: Abdominal Aortogram w/Lower Extremity;  Surgeon: Renford Dills, MD;  Location: [facility] INVASIVE CV LAB;  Service: Cardiovascular;  Laterality: N/A;   PERIPHERAL VASCULAR CATHETERIZATION  10/09/2015   Procedure: Lower Extremity Intervention;  Surgeon: Renford Dills, MD;  Location: [facility] INV[facility] CV LAB;  Service: Cardiovascular;;   PERIPHERAL VASCULAR CATHETERIZATION Right 10/10/2015   Procedure: Lower Extremity Angiography;  Surgeon: Annice Needy, MD;  Location: [facility] INV[facility] LAB;  Service: Cardiovascular;  Laterality: Right;   PERIPHERAL VASCULAR CATHETERIZATION Right 10/10/2015   Procedure: Lower Extremity Intervention;  Surgeon: Annice Needy, MD;  Location: [facility] INV[facility] CV LAB;  Service: Cardiovascular;  Laterality: Right;   PERIPHERAL VASCULAR CATHETERIZATION Right 12/03/2015   Procedure: Lower Extremity Angiography;  Surgeon: Annice Needy, MD;  Location: [facility] IN[facility];  Service: Cardiovascular;  Laterality: Right;   PERIPHERAL VASCULAR CATHETERIZATION Right 12/04/2015   Procedure: Lower Extremity Angiography;  Surgeon: Annice Needy, MD;  Location: [facility];  Service: Cardiovascular;  Laterality: Right;   PERIPHERAL VASCULAR CATHETERIZATION  12/04/2015   Procedure: Lower Extremity Intervention;  Surgeon: Annice Needy, MD;  Location: [facility] IN[facility];  Service: Cardiovascular;;   PERIPHERAL VASCULAR CATHETERIZATION Right 06/30/2016   Procedure: Lower Extremity Angiography;  Surgeon: Annice Needy, MD;  Location: [facility] INV[facility] LAB;  Service: Cardiovascular;  Laterality: Right;   PERIPHERAL VASCULAR CATHETERIZATION Right 06/25/2016   Procedure: Lower Extremity Angiography;  Surgeon: Renford Dills, MD;  Location: [facility];  Service: Cardiovascular;  Laterality: Right;   PERIPHERAL VASCULAR CATHETERIZATION Right 07/01/2016   Procedure: Lower Extremity Angiography;  Surgeon: Renford Dills, MD;  Location: [facility] IN[facility];  Service: Cardiovascular;  Laterality: Right;   PERIPHERAL VASCULAR CATHETERIZATION  07/01/2016   Procedure: Lower Extremity Intervention;  Surgeon: Renford Dills, MD;  Location: [facility] INV[facility] LAB;  Service: Cardiovascular;;   PERIPHERAL VASCULAR CATHETERIZATION Right 08/01/2016   Procedure: Lower Extremity Angiography;  Surgeon: Renford Dills, MD;  Location: [facility] INVAS[facility] CV LAB;  Service: Cardiovascular;  Laterality: Right;   PORTA CATH INSERTION N/A 10/05/2020   Procedure: PORTA CATH INSERTION;  Surgeon: Renford Dills, MD;  Location: [facility] INVASIVE CV LAB;  Service: Cardiovascular;  Laterality: N/A;   stent placement in right leg Right    VIDEO BRONCHOSCOPY N/A 09/14/2020   Procedure: VIDEO BRONCHOSCOPY WITH FLUORO;  Surgeon: Salena Saner, MD;  Location: [facility] ORS;  Service: Cardiopulmonary;  Laterality: N/A;   VIDEO BRONCHOSCOPY WITH ENDOBRONCHIAL ULTRASOUND N/A 09/14/2020   Procedure: VIDEO BRONCHOSCOPY WITH ENDOBRONCHIAL ULTRASOUND;  Surgeon: Salena Saner, MD;  Location: [facility] ORS;  Service: Cardiopulmonary;  Laterality: N/A;    SOCIAL HISTORY: Social History   Socioeconomic History   Marital status: Single    Spouse name: Not on file   Number of children: Not on file   Years of education: Not on file   Highest education level: Not on file  Occupational History   Occupation: unemployed  Tobacco Use   Smoking status: Former    Current packs/day: 0.00    Average packs/day: 2.0 packs/day for 38.0 years (76.0 ttl pk-yrs)    Types: Cigarettes    Start date: 12/19/1976    Quit date: 12/20/2014    Years since quitting: 8.5   Smokeless tobacco: Never   Tobacco comments:    quit smoking in 2017 most smoked 2    Vaping Use   Vaping status: Former  Substance and Sexual Activity   Alcohol use: Not Currently   Drug use: No   Sexual activity: Yes  Other Topics Concern   Not on file  Social History Narrative   Not on file   Social Drivers of Health   Financial Resource Strain: Patient Declined (05/29/2023)   Overall Financial Resource Strain (CARDIA)    Difficulty of Paying Living Expenses: Patient declined  Food Insecurity: Patient Declined (05/29/2023)   Hunger Vital Sign    Worried About Running Out of Food in the Last Year: Patient declined    Ran Out of Food in the Last Year: Patient declined  Transportation Needs: No Transportation Needs (05/29/2023)   PRAPARE - Administrator, Civil Service (Medical): No    Lack of Transportation (Non-Medical): No  Physical Activity: Insufficiently Active (05/29/2023)   Exercise Vital Sign    Days of Exercise per Week: 2 days    Minutes of Exercise per Session: 10 min  Stress: No Stress Concern Present (05/29/2023)   [employer] of Occupational Health - Occupational Stress Questionnaire    Feeling of Stress : Not at all  Social Connections: Unknown (05/29/2023)   Social Connection and Isolation Panel [  NHANES]    Frequency of Communication with Friends and Family: More than three times a week    Frequency of Social Gatherings with Friends and Family: Three times a week    Attends Religious Services: Patient declined    Active Member of Clubs or Organizations: No    Attends Engineer, structural: Not on file    Marital Status: Patient declined  Catering manager Violence: Not on file    FAMILY HISTORY: Family History  Problem Relation Age of Onset   Diabetes Mother    Hypertension Mother    Heart murmur Mother    Leukemia Mother    Throat cancer Maternal Grandmother    Colon cancer Neg Hx    Breast cancer Neg Hx     ALLERGIES:  has no known allergies.  MEDICATIONS:  Current Outpatient Medications  Medication Sig  Dispense Refill   aspirin EC 81 MG tablet Take 81 mg by mouth daily. Swallow whole.     clopidogrel (PLAVIX) 75 MG tablet TAKE 1 TABLET BY MOUTH DAILY 100 tablet 2   levothyroxine (SYNTHROID) 125 MCG tablet Take 1 tablet (125 mcg total) by mouth daily before breakfast. 90 tablet 0   lisinopril (ZESTRIL) 20 MG tablet Take 1 tablet (20 mg total) by mouth daily. 90 tablet 3   metFORMIN (GLUCOPHAGE-XR) 750 MG 24 hr tablet Take 1 tablet (750 mg total) by mouth daily with breakfast. 90 tablet 3   potassium chloride (KLOR-CON M) 10 MEQ tablet Take 1 tablet (10 mEq total) by mouth 2 (two) times daily. 30 tablet 1   rosuvastatin (CRESTOR) 40 MG tablet Take 1 tablet (40 mg total) by mouth daily. 90 tablet 3   Vitamin D, Ergocalciferol, (DRISDOL) 1.25 MG (50000 UNIT) CAPS capsule Take 1 capsule (50,000 Units total) by mouth every 7 (seven) days. 13 capsule 3   No current facility-administered medications for this visit.   Facility-Administered Medications Ordered in Other Visits  Medication Dose Route Frequency Provider Last Rate Last Admin   albuterol (PROVENTIL) (2.5 MG/3ML) 0.083% nebulizer solution 2.5 mg  2.5 mg Nebulization Once Salena Saner, MD       durvalumab (IMFINZI) 1,000 mg in sodium chloride 0.9 % 100 mL chemo infusion  10 mg/kg (Treatment Plan Recorded) Intravenous Once [name], MD       heparin lock flush 100 unit/mL  500 Units Intracatheter Once PRN [name], MD         PHYSICAL EXAMINATION: ECOG PERFORMANCE STATUS: 1 - Symptomatic but completely ambulatory Vitals:   07/03/23 0904 07/03/23 0906  BP:  127/68  Pulse:  77  Resp: 18 18  Temp:  (!) 96 F (35.6 C)  SpO2:  100%    Filed Weights   07/03/23 0904  Weight: 213 lb (96.6 kg)     Physical Exam Constitutional:      General: He is not in acute distress.    Appearance: He is obese.     Comments: Walks with a cane  HENT:     Head: Normocephalic and atraumatic.  Eyes:     General: No scleral  icterus. Cardiovascular:     Rate and Rhythm: Normal rate and regular rhythm.  Pulmonary:     Effort: Pulmonary effort is normal. No respiratory distress.     Breath sounds: Normal breath sounds. No wheezing.  Abdominal:     General: There is no distension.     Palpations: Abdomen is soft.     Tenderness: There is no  abdominal tenderness.  Musculoskeletal:     Cervical back: Normal range of motion and neck supple.  Skin:    General: Skin is warm and dry.     Findings: No erythema.  Neurological:     Mental Status: He is alert and oriented to person, place, and time. Mental status is at baseline.     Cranial Nerves: No cranial nerve deficit.     Motor: No abnormal muscle tone.  Psychiatric:        Mood and Affect: Mood and affect normal.      LABORATORY DATA:  I have reviewed the data as listed    Latest Ref Rng & Units 07/03/2023    8:56 AM 06/17/2023   12:57 PM 06/03/2023    1:01 PM  CBC  WBC 4.0 - 10.5 K/uL 5.9  4.8  7.7   Hemoglobin 13.0 - 17.0 g/dL 40.9  81.1  91.4   Hematocrit 39.0 - 52.0 % 40.4  37.4  41.2   Platelets 150 - 400 K/uL 214  195  217       Latest Ref Rng & Units 07/03/2023    8:56 AM 06/17/2023   12:57 PM 06/03/2023    1:01 PM  CMP  Glucose 70 - 99 mg/dL 782  956  213   BUN 8 - 23 mg/dL 17  20  25    Creatinine 0.61 - 1.24 mg/dL 0.86  5.78  4.69   Sodium 135 - 145 mmol/L 137  138  135   Potassium 3.5 - 5.1 mmol/L 4.1  3.7  3.9   Chloride 98 - 111 mmol/L 104  104  102   CO2 22 - 32 mmol/L 24  25  24    Calcium 8.9 - 10.3 mg/dL 9.2  8.9  9.0   Total Protein 6.5 - 8.1 g/dL 7.0  6.9  7.0   Total Bilirubin <1.2 mg/dL 0.4  0.7  0.6   Alkaline Phos 38 - 126 U/L 83  77  75   AST 15 - 41 U/L 22  23  19    ALT 0 - 44 U/L 20  18  22       RADIOGRAPHIC STUDIES: I have personally reviewed the radiological images as listed and agreed with the findings in the report. CT CHEST ABDOMEN PELVIS W CONTRAST Result Date: 06/29/2023 CLINICAL DATA:  Lung cancer  restaging * Tracking Code: BO * EXAM: CT CHEST, ABDOMEN, AND PELVIS WITH CONTRAST TECHNIQUE: Multidetector CT imaging of the chest, abdomen and pelvis was performed following the standard protocol during bolus administration of intravenous contrast. RADIATION DOSE REDUCTION: This exam was performed according to the departmental dose-optimization program which includes automated exposure control, adjustment of the mA and/or kV according to patient size and/or use of iterative reconstruction technique. CONTRAST:  OMNIPAQUE IOHEXOL 300 MG/ML  SOLN COMPARISON:  03/12/2023 FINDINGS: CT CHEST FINDINGS Cardiovascular: Right chest port catheter. Normal heart size. Left and right coronary artery calcifications. No pericardial effusion. Mediastinum/Nodes: No enlarged mediastinal, hilar, or axillary lymph nodes. Thyroid gland, trachea, and esophagus demonstrate no significant findings. Lungs/Pleura: Minimal paraseptal emphysema. Unchanged post treatment/post radiation appearance of the right lung, with perihilar and suprahilar paramedian fibrosis and consolidation (series 4, image 59). No pleural effusion or pneumothorax. Musculoskeletal: No chest wall abnormality. No acute osseous findings. CT ABDOMEN PELVIS FINDINGS Hepatobiliary: No solid liver abnormality is seen. Hepatic steatosis. No gallstones, gallbladder wall thickening, or biliary dilatation. Pancreas: Unremarkable. No pancreatic ductal dilatation or surrounding inflammatory changes. Spleen: Normal  in size without significant abnormality. Adrenals/Urinary Tract: Adrenal glands are unremarkable. Kidneys are normal, without renal calculi, solid lesion, or hydronephrosis. Bladder is unremarkable. Stomach/Bowel: Stomach is within normal limits. Appendix appears normal. No evidence of bowel wall thickening, distention, or inflammatory changes. Sigmoid diverticulosis. Vascular/Lymphatic: Aortic atherosclerosis. No enlarged abdominal or pelvic lymph nodes.  Reproductive: No mass or other abnormality. Other: No abdominal wall hernia or abnormality. No ascites. Musculoskeletal: No acute osseous findings. IMPRESSION: 1. Unchanged post treatment/post radiation appearance of the right lung, with perihilar and suprahilar paramedian fibrosis and consolidation. 2. No evidence of lymphadenopathy or metastatic disease in the chest, abdomen, or pelvis. 3. Hepatic steatosis. 4. Coronary artery disease. Aortic Atherosclerosis (ICD10-I70.0) and Emphysema (ICD10-J43.9). Electronically Signed   By: Jearld Lesch M.D.   On: 06/29/2023 07:39   MR BRAIN W WO CONTRAST Result Date: 05/10/2023 CLINICAL DATA:  67 year old male with metastatic lung cancer. Prior SRS, and salvage SRS 5 months ago. Restaging. EXAM: MRI HEAD WITHOUT AND WITH CONTRAST TECHNIQUE: Multiplanar, multiecho pulse sequences of the brain and surrounding structures were obtained without and with intravenous contrast. CONTRAST:  10mL GADAVIST GADOBUTROL 1 MMOL/ML IV SOLN COMPARISON:  Brain MRI 02/05/2023 and earlier. FINDINGS: BRAIN New Lesions: None. Larger lesions: Irregular mostly rim enhancing lesion in the superior left parietal lobe near the vertex has increased fro 10-11 mm in July to now 16-17 mm, more triangular than rounded now (series 18, image 18) and increased regional T2/FLAIR hyperintense increased edema (series 9, image 41). See postcontrast series 16, image 145, series 17, image 112, series 18 image 18. DWI heterogeneity has also mildly increased. Stable or Smaller lesions: Left anterior and inferior cerebellar enhancing lesion on series 16, image 37, 3-4 mm. Right occipital pole rim enhancing lesion on series 16, image 59, now 9-10 mm and slightly smaller. Surrounding mild T2/FLAIR has regressed. Left operculum nodular and rim enhancing lesion which remains about 19 mm overall but appears slightly contracted since July (series 16, image 103). Confluent surrounding T2/FLAIR hyperintensity in a  vasogenic edema pattern has not significantly changed and is contiguous with the T2/FLAIR abnormality of superior left parietal lesion. DWI heterogeneity is stable. Other Brain findings: Mild mass effect on the left lateral ventricle is slightly more apparent since July but there is no significant midline shift (series 9, image 32). Other widely scattered nonenhancing cerebral white matter T2 and FLAIR hyperintensity is stable. Stable mild hemosiderin. No restricted diffusion to suggest acute infarction. No ventriculomegaly, extra-axial collection or acute intracranial hemorrhage. Cervicomedullary junction and pituitary are within normal limits. Vascular: Major intracranial vascular flow voids are stable. Dominant left vertebral artery. Following contrast major dural venous sinuses remain patent. Skull and upper cervical spine: Stable and within normal limits. Sinuses/Orbits: Stable, negative. Other: Mastoids remain well aerated. Visible internal auditory structures appear normal. Negative visible scalp and face. IMPRESSION: 1. Since July progression of a single lesion at the superior left perirolandic area; mildly larger (up to 17 mm) and with increased regional T2/FLAIR. No significant intracranial mass effect. Favor treatment effect/Radiation Necrosis, as the three additional lesions are all stable or smaller, and no new metastasis is identified. 2. No new intracranial abnormality. Electronically Signed   By: Odessa Fleming M.D.   On: 05/10/2023 12:23

## 2023-07-03 NOTE — Assessment & Plan Note (Addendum)
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  May 2024 CT scan showed Improving bilateral scattered interstitial and ground-glass opacities. Discussed with pulmonology Dr. Jayme Cloud. 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  Labs are reviewed and discussed with patient. Proceed with  Durvalumab.  Repeat CT showed- stable disease

## 2023-07-03 NOTE — Assessment & Plan Note (Signed)
 continue levothyroxine 125 mcg daily

## 2023-07-03 NOTE — Assessment & Plan Note (Signed)
Status post radiation,  02/19/22 MRI showed decreased brain lesion size and edema.  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 Mitzi Hansen.  Follow up with Dr. Barbaraann Cao. -repeat MRI brain

## 2023-07-05 LAB — T4: T4, Total: 11.3 ug/dL (ref 4.5–12.0)

## 2023-07-09 ENCOUNTER — Encounter (INDEPENDENT_AMBULATORY_CARE_PROVIDER_SITE_OTHER): Payer: Self-pay | Admitting: Vascular Surgery

## 2023-07-09 ENCOUNTER — Ambulatory Visit: Payer: Medicare Other | Admitting: Oncology

## 2023-07-09 ENCOUNTER — Ambulatory Visit (INDEPENDENT_AMBULATORY_CARE_PROVIDER_SITE_OTHER): Payer: Medicare Other

## 2023-07-09 ENCOUNTER — Ambulatory Visit (INDEPENDENT_AMBULATORY_CARE_PROVIDER_SITE_OTHER): Payer: Medicare Other | Admitting: Vascular Surgery

## 2023-07-09 ENCOUNTER — Other Ambulatory Visit: Payer: Medicare Other

## 2023-07-09 ENCOUNTER — Ambulatory Visit: Payer: Medicare Other

## 2023-07-09 VITALS — BP 115/73 | HR 80 | Resp 18 | Ht 71.0 in | Wt 210.4 lb

## 2023-07-09 DIAGNOSIS — I70219 Atherosclerosis of native arteries of extremities with intermittent claudication, unspecified extremity: Secondary | ICD-10-CM | POA: Diagnosis not present

## 2023-07-09 DIAGNOSIS — E782 Mixed hyperlipidemia: Secondary | ICD-10-CM | POA: Diagnosis not present

## 2023-07-09 DIAGNOSIS — E039 Hypothyroidism, unspecified: Secondary | ICD-10-CM

## 2023-07-09 DIAGNOSIS — J449 Chronic obstructive pulmonary disease, unspecified: Secondary | ICD-10-CM

## 2023-07-09 LAB — VAS US ABI WITH/WO TBI
Left ABI: 1.13
Right ABI: 0.44

## 2023-07-09 NOTE — Progress Notes (Signed)
MRN : [id number]  Joshua [name] is a 67 y.o. ([birth year]) male who presents with chief complaint of check circulation.  History of Present Illness:   The patient returns to the office for followup and review status post angiogram with intervention on 04/08/2022.    Procedure: Percutaneous transluminal angioplasty right profunda femoris artery Mechanical thrombectomy of the right profunda femoris artery using the Rota Rex catheter.    The patient notes improvement in the lower extremity symptoms. No interval shortening of the patient's claudication distance or rest pain symptoms. No new ulcers or wounds have occurred since the last visit.   There have been no significant changes to the patient's overall health care.   No documented history of amaurosis fugax or recent TIA symptoms. There are no recent neurological changes noted. No documented history of DVT, PE or superficial thrombophlebitis. The patient denies recent episodes of angina or shortness of breath.    ABI's Rt=0.44 and Lt=1.13  (previous ABI's Rt=0.45 and Lt=1.03 )  Current Meds  Medication Sig   aspirin EC 81 MG tablet Take 81 mg by mouth daily. Swallow whole.   clopidogrel (PLAVIX) 75 MG tablet TAKE 1 TABLET BY MOUTH DAILY   levothyroxine (SYNTHROID) 125 MCG tablet Take 1 tablet (125 mcg total) by mouth daily before breakfast.   potassium chloride (KLOR-CON M) 10 MEQ tablet Take 1 tablet (10 mEq total) by mouth 2 (two) times daily.   rosuvastatin (CRESTOR) 40 MG tablet Take 1 tablet (40 mg total) by mouth daily.   Vitamin D, Ergocalciferol, (DRISDOL) 1.25 MG (50000 UNIT) CAPS capsule Take 1 capsule (50,000 Units total) by mouth every 7 (seven) days.    Past Medical History:  Diagnosis Date   Allergy    Cancer (HCC)    lung   COPD (chronic obstructive pulmonary disease) (HCC)    Dyspnea    former smoker. only doe   History of kidney  stones    Hypertension    Hypothyroidism due to medication 04/04/2021   Peripheral vascular disease (HCC)    Pre-diabetes    Sleep apnea     Past Surgical History:  Procedure Laterality Date   ANTERIOR CERVICAL DECOMP/DISCECTOMY FUSION N/A 03/14/2020   Procedure: ANTERIOR CERVICAL DECOMPRESSION/DISCECTOMY FUSION 3 LEVELS C4-7;  Surgeon: Venetia Night, MD;  Location: [facility];  Service: Neurosurgery;  Laterality: N/A;   BACK SURGERY  2011   CENTRAL LINE INSERTION Right 09/10/2016   Procedure: CENTRAL LINE INSERTION;  Surgeon: Renford Dills, MD;  Location: [facility];  Service: Vascular;  Laterality: Right;   CERVICAL FUSION     C4-c7 on Mar 14, 2020   COLONOSCOPY  2013 ?   COLONOSCOPY N/A 09/09/2021   Procedure: COLONOSCOPY;  Surgeon: Jaynie Collins, DO;  Location: [facility] ENDOSCOPY;  Service: Gastroenterology;  Laterality: N/A;   CORONARY ANGIOPLASTY     ESOPHAGOGASTRODUODENOSCOPY N/A 09/09/2021   Procedure: ESOPHAGOGASTRODUODENOSCOPY (EGD);  Surgeon: Jaynie Collins, DO;  Location: [facility];  Service: Gastroenterology;  Laterality: N/A;   FEMORAL-POPLITEAL BYPASS GRAFT Right 09/10/2016   Procedure: BYPASS GRAFT FEMORAL-POPLITEAL ARTERY ( BELOW KNEE );  Surgeon: Renford Dills, MD;  Location: [facility];  Service: Vascular;  Laterality: Right;   FEMORAL-TIBIAL BYPASS GRAFT Right 03/23/2020   Procedure: BYPASS GRAFT RIGHT FEMORAL- DISTAL TIBIAL ARTERY WITH DISTAL FLOW GRAFT;  Surgeon: Renford Dills, MD;  Location: [facility];  Service: Vascular;  Laterality: Right;   LEFT HEART CATH AND CORONARY ANGIOGRAPHY Left 05/16/2021   Procedure: LEFT HEART CATH AND CORONARY ANGIOGRAPHY;  Surgeon: Armando Reichert, MD;  Location: [facility] INVASIVE CV LAB;  Service: Cardiovascular;  Laterality: Left;   LOWER EXTREMITY ANGIOGRAPHY Right 11/18/2016   Procedure: Lower Extremity Angiography;  Surgeon: Renford Dills, MD;  Location: [facility] INVASIVE CV LAB;  Service: Cardiovascular;   Laterality: Right;   LOWER EXTREMITY ANGIOGRAPHY Right 12/09/2016   Procedure: Lower Extremity Angiography;  Surgeon: Renford Dills, MD;  Location: [facility] INVAS[facility] CV LAB;  Service: Cardiovascular;  Laterality: Right;   LOWER EXTREMITY ANGIOGRAPHY Right 11/15/2019   Procedure: LOWER EXTREMITY ANGIOGRAPHY;  Surgeon: Renford Dills, MD;  Location: [facility] INV[facility];  Service: Cardiovascular;  Laterality: Right;   LOWER EXTREMITY ANGIOGRAPHY Right 12/01/2019   Procedure: Lower Extremity Angiography;  Surgeon: Annice Needy, MD;  Location: [facility] INV[facility];  Service: Cardiovascular;  Laterality: Right;   LOWER EXTREMITY ANGIOGRAPHY Right 12/02/2019   Procedure: LOWER EXTREMITY ANGIOGRAPHY;  Surgeon: Renford Dills, MD;  Location: [facility];  Service: Cardiovascular;  Laterality: Right;   LOWER EXTREMITY ANGIOGRAPHY Right 03/23/2020   Procedure: Lower Extremity Angiography;  Surgeon: Renford Dills, MD;  Location: [facility];  Service: Cardiovascular;  Laterality: Right;   LOWER EXTREMITY ANGIOGRAPHY Right 09/25/2021   Procedure: Lower Extremity Angiography;  Surgeon: Renford Dills, MD;  Location: [facility];  Service: Cardiovascular;  Laterality: Right;   LOWER EXTREMITY ANGIOGRAPHY Right 09/26/2021   Procedure: Lower Extremity Angiography;  Surgeon: Annice Needy, MD;  Location: [facility];  Service: Cardiovascular;  Laterality: Right;   LOWER EXTREMITY ANGIOGRAPHY Right 04/08/2022   Procedure: Lower Extremity Angiography;  Surgeon: Renford Dills, MD;  Location: [facility];  Service: Cardiovascular;  Laterality: Right;   LOWER EXTREMITY INTERVENTION  11/18/2016   Procedure: Lower Extremity Intervention;  Surgeon: Renford Dills, MD;  Location: [facility];  Service: Cardiovascular;;   PERIPHERAL VASCULAR CATHETERIZATION Right 01/02/2015   Procedure: Lower Extremity Angiography;  Surgeon: Renford Dills, MD;  Location:  [facility] IN[facility];  Service: Cardiovascular;  Laterality: Right;   PERIPHERAL VASCULAR CATHETERIZATION Right 06/18/2015   Procedure: Lower Extremity Angiography;  Surgeon: Annice Needy, MD;  Location: [facility];  Service: Cardiovascular;  Laterality: Right;   PERIPHERAL VASCULAR CATHETERIZATION  06/18/2015   Procedure: Lower Extremity Intervention;  Surgeon: Annice Needy, MD;  Location: [facility];  Service: Cardiovascular;;   PERIPHERAL VASCULAR CATHETERIZATION N/A 10/09/2015   Procedure: Abdominal Aortogram w/Lower Extremity;  Surgeon: Renford Dills, MD;  Location: [facility];  Service: Cardiovascular;  Laterality: N/A;   PERIPHERAL VASCULAR CATHETERIZATION  10/09/2015   Procedure: Lower Extremity Intervention;  Surgeon: Renford Dills, MD;  Location: [facility];  Service: Cardiovascular;;   PERIPHERAL VASCULAR CATHETERIZATION Right 10/10/2015   Procedure: Lower Extremity Angiography;  Surgeon: Annice Needy, MD;  Location: [facility];  Service: Cardiovascular;  Laterality: Right;   PERIPHERAL VASCULAR CATHETERIZATION Right 10/10/2015   Procedure: Lower Extremity Intervention;  Surgeon: Annice Needy, MD;  Location:  [facility];  Service: Cardiovascular;  Laterality: Right;   PERIPHERAL VASCULAR CATHETERIZATION Right 12/03/2015   Procedure: Lower Extremity Angiography;  Surgeon: Annice Needy, MD;  Location: [facility];  Service: Cardiovascular;  Laterality: Right;   PERIPHERAL VASCULAR CATHETERIZATION Right 12/04/2015   Procedure: Lower Extremity Angiography;  Surgeon: Annice Needy, MD;  Location: [facility];  Service: Cardiovascular;  Laterality: Right;   PERIPHERAL VASCULAR CATHETERIZATION  12/04/2015   Procedure: Lower Extremity Intervention;  Surgeon: Annice Needy, MD;  Location: [facility];  Service: Cardiovascular;;   PERIPHERAL VASCULAR CATHETERIZATION Right 06/30/2016   Procedure: Lower Extremity  Angiography;  Surgeon: Annice Needy, MD;  Location: [facility];  Service: Cardiovascular;  Laterality: Right;   PERIPHERAL VASCULAR CATHETERIZATION Right 06/25/2016   Procedure: Lower Extremity Angiography;  Surgeon: Renford Dills, MD;  Location: [facility];  Service: Cardiovascular;  Laterality: Right;   PERIPHERAL VASCULAR CATHETERIZATION Right 07/01/2016   Procedure: Lower Extremity Angiography;  Surgeon: Renford Dills, MD;  Location: [facility];  Service: Cardiovascular;  Laterality: Right;   PERIPHERAL VASCULAR CATHETERIZATION  07/01/2016   Procedure: Lower Extremity Intervention;  Surgeon: Renford Dills, MD;  Location: [facility];  Service: Cardiovascular;;   PERIPHERAL VASCULAR CATHETERIZATION Right 08/01/2016   Procedure: Lower Extremity Angiography;  Surgeon: Renford Dills, MD;  Location: [facility];  Service: Cardiovascular;  Laterality: Right;   PORTA CATH INSERTION N/A 10/05/2020   Procedure: PORTA CATH INSERTION;  Surgeon: Renford Dills, MD;  Location: [facility];  Service: Cardiovascular;  Laterality: N/A;   stent placement in right leg Right    VIDEO BRONCHOSCOPY N/A 09/14/2020   Procedure: VIDEO BRONCHOSCOPY WITH FLUORO;  Surgeon: Salena Saner, MD;  Location: [facility];  Service: Cardiopulmonary;  Laterality: N/A;   VIDEO BRONCHOSCOPY WITH ENDOBRONCHIAL ULTRASOUND N/A 09/14/2020   Procedure: VIDEO BRONCHOSCOPY WITH ENDOBRONCHIAL ULTRASOUND;  Surgeon: Salena Saner, MD;  Location: [facility];  Service: Cardiopulmonary;  Laterality: N/A;    Social History Social History   Tobacco Use   Smoking status: Former    Current packs/day: 0.00    Average packs/day: 2.0 packs/day for 38.0 years (76.0 ttl pk-yrs)    Types: Cigarettes    Start date: 12/19/1976    Quit date: 12/20/2014    Years since quitting: 8.5   Smokeless tobacco: Never   Tobacco comments:    quit smoking in 2017 most smoked 2   Vaping  Use   Vaping status: Former  Substance Use Topics   Alcohol use: Not Currently   Drug use: No    Family History Family History  Problem Relation Age of Onset   Diabetes Mother    Hypertension Mother    Heart murmur Mother    Leukemia Mother    Throat cancer Maternal Grandmother    Colon cancer Neg Hx    Breast cancer Neg Hx     No Known Allergies   REVIEW OF SYSTEMS (Negative unless checked)  Constitutional: [] Weight loss  [] Fever  [] Chills Cardiac: [] Chest pain   [] Chest pressure   [] Palpitations   [] Shortness of breath when laying flat   [] Shortness of breath with exertion. Vascular:  [x] Pain in legs with walking   [] Pain in legs at rest  [] History of DVT   [] Phlebitis   [] Swelling in legs   [] Varicose veins   [] Non-healing ulcers Pulmonary:   [] Uses home oxygen   []   Productive cough   [] Hemoptysis   [] Wheeze  [] COPD   [] Asthma Neurologic:  [] Dizziness   [] Seizures   [] History of stroke   [] History of TIA  [] Aphasia   [] Vissual changes   [] Weakness or numbness in arm   [] Weakness or numbness in leg Musculoskeletal:   [] Joint swelling   [] Joint pain   [] Low back pain Hematologic:  [] Easy bruising  [] Easy bleeding   [] Hypercoagulable state   [] Anemic Gastrointestinal:  [] Diarrhea   [] Vomiting  [] Gastroesophageal reflux/heartburn   [] Difficulty swallowing. Genitourinary:  [] Chronic kidney disease   [] Difficult urination  [] Frequent urination   [] Blood in urine Skin:  [] Rashes   [] Ulcers  Psychological:  [] History of anxiety   []  History of major depression.  Physical Examination  Vitals:   07/09/23 1430  BP: 115/73  Pulse: 80  Resp: 18  Weight: 210 lb 6.4 oz (95.4 kg)  Height: 5\' 11"  (1.803 m)   Body mass index is 29.34 kg/m. Gen: WD/WN, NAD Head: [place]/AT, No temporalis wasting.  Ear/Nose/Throat: Hearing grossly intact, nares w/o erythema or drainage Eyes: PER, EOMI, sclera nonicteric.  Neck: Supple, no masses.  No bruit or JVD.  Pulmonary:  Good air movement, no  audible wheezing, no use of accessory muscles.  Cardiac: RRR, normal S1, S2, no Murmurs. Vascular:  mild trophic changes, no open wounds Vessel Right Left  Radial Palpable Palpable  PT Not Palpable Palpable  DP Not Palpable  Palpable  Gastrointestinal: soft, non-distended. No guarding/no peritoneal signs.  Musculoskeletal: M/S 5/5 throughout.  No visible deformity.  Neurologic: CN 2-12 intact. Pain and light touch intact in extremities.  Symmetrical.  Speech is fluent. Motor exam as listed above. Psychiatric: Judgment intact, Mood & affect appropriate for pt's clinical situation. Dermatologic: No rashes or ulcers noted.  No changes consistent with cellulitis.   CBC Lab Results  Component Value Date   WBC 5.9 07/03/2023   HGB 13.3 07/03/2023   HCT 40.4 07/03/2023   MCV 82.6 07/03/2023   PLT 214 07/03/2023    BMET    Component Value Date/Time   NA 137 07/03/2023 0856   NA 138 06/02/2023 1049   K 4.1 07/03/2023 0856   CL 104 07/03/2023 0856   CO2 24 07/03/2023 0856   GLUCOSE 130 (H) 07/03/2023 0856   BUN 17 07/03/2023 0856   BUN 18 06/02/2023 1049   BUN 16 07/03/2014 1205   CREATININE 1.07 07/03/2023 0856   CREATININE 1.11 11/06/2022 1041   CREATININE 1.34 (H) 07/03/2014 1205   CALCIUM 9.2 07/03/2023 0856   GFRNONAA >60 07/03/2023 0856   GFRNONAA >60 11/06/2022 1041   GFRNONAA 59 (L) 07/03/2014 1205   GFRAA >60 03/30/2020 0536   GFRAA >60 07/03/2014 1205   Estimated Creatinine Clearance: 81.1 mL/min (by C-G formula based on SCr of 1.07 mg/dL).  COAG Lab Results  Component Value Date   INR 1.2 09/26/2021   INR 1.2 08/25/2020   INR 1.0 03/05/2020    Radiology MR BRAIN W WO CONTRAST Result Date: 07/03/2023 CLINICAL DATA:  Brain/CNS neoplasm, assess treatment response. Metastatic lung cancer, restaging. EXAM: MRI HEAD WITHOUT AND WITH CONTRAST TECHNIQUE: Multiplanar, multiecho pulse sequences of the brain and surrounding structures were obtained without and with  intravenous contrast. CONTRAST:  9mL GADAVIST GADOBUTROL 1 MMOL/ML IV SOLN COMPARISON:  MRI brain 04/16/2023. FINDINGS: Brain: Continued increase in size of the peripherally enhancing mass centered within the medial aspect of the left postcentral gyrus, now measuring up to 22 mm (coronal image 14 series  17), previously 17 mm. Associated increase in surrounding vasogenic edema. Overall unchanged size but decreased central necrotic component of the 17 mm lesion in the lateral aspect of the left precentral gyrus (coronal image 24 series 17) with associated increase in surrounding vasogenic edema. Unchanged peripherally enhancing lesion in the right occipital lobe (axial image 65 series 16). Slightly decreased punctate enhancing lesion in anterior aspect of the left cerebellar hemisphere (axial image 44 series 16). No acute infarct or hemorrhage. No hydrocephalus or extra-axial collection. No midline shift. Vascular: Normal flow voids and vessel enhancement. Skull and upper cervical spine: Normal marrow signal and enhancement. Sinuses/Orbits: No acute findings. Other: None. IMPRESSION: 1. Continued increase in size of the peripherally enhancing mass centered within the medial aspect of the left postcentral gyrus, now measuring up to 22 mm, previously 17 mm. Associated increase in surrounding vasogenic edema. 2. Overall unchanged size but decreased central necrotic component of the 17 mm lesion in the lateral aspect of the left precentral gyrus with associated increase in surrounding vasogenic edema. 3. Unchanged peripherally enhancing lesion in the right occipital lobe. Slightly decreased punctate enhancing lesion in the anterior aspect of the left cerebellar hemisphere. Electronically Signed   By: Orvan Falconer M.D.   On: 07/03/2023 13:47   CT CHEST ABDOMEN PELVIS W CONTRAST Result Date: 06/29/2023 CLINICAL DATA:  Lung cancer restaging * Tracking Code: BO * EXAM: CT CHEST, ABDOMEN, AND PELVIS WITH CONTRAST  TECHNIQUE: Multidetector CT imaging of the chest, abdomen and pelvis was performed following the standard protocol during bolus administration of intravenous contrast. RADIATION DOSE REDUCTION: This exam was performed according to the departmental dose-optimization program which includes automated exposure control, adjustment of the mA and/or kV according to patient size and/or use of iterative reconstruction technique. CONTRAST:  OMNIPAQUE IOHEXOL 300 MG/ML  SOLN COMPARISON:  03/12/2023 FINDINGS: CT CHEST FINDINGS Cardiovascular: Right chest port catheter. Normal heart size. Left and right coronary artery calcifications. No pericardial effusion. Mediastinum/Nodes: No enlarged mediastinal, hilar, or axillary lymph nodes. Thyroid gland, trachea, and esophagus demonstrate no significant findings. Lungs/Pleura: Minimal paraseptal emphysema. Unchanged post treatment/post radiation appearance of the right lung, with perihilar and suprahilar paramedian fibrosis and consolidation (series 4, image 59). No pleural effusion or pneumothorax. Musculoskeletal: No chest wall abnormality. No acute osseous findings. CT ABDOMEN PELVIS FINDINGS Hepatobiliary: No solid liver abnormality is seen. Hepatic steatosis. No gallstones, gallbladder wall thickening, or biliary dilatation. Pancreas: Unremarkable. No pancreatic ductal dilatation or surrounding inflammatory changes. Spleen: Normal in size without significant abnormality. Adrenals/Urinary Tract: Adrenal glands are unremarkable. Kidneys are normal, without renal calculi, solid lesion, or hydronephrosis. Bladder is unremarkable. Stomach/Bowel: Stomach is within normal limits. Appendix appears normal. No evidence of bowel wall thickening, distention, or inflammatory changes. Sigmoid diverticulosis. Vascular/Lymphatic: Aortic atherosclerosis. No enlarged abdominal or pelvic lymph nodes. Reproductive: No mass or other abnormality. Other: No abdominal wall hernia or abnormality.  No ascites. Musculoskeletal: No acute osseous findings. IMPRESSION: 1. Unchanged post treatment/post radiation appearance of the right lung, with perihilar and suprahilar paramedian fibrosis and consolidation. 2. No evidence of lymphadenopathy or metastatic disease in the chest, abdomen, or pelvis. 3. Hepatic steatosis. 4. Coronary artery disease. Aortic Atherosclerosis (ICD10-I70.0) and Emphysema (ICD10-J43.9). Electronically Signed   By: Jearld Lesch M.D.   On: 06/29/2023 07:39     Assessment/Plan 1. 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  The patient does not voice lifestyle limiting changes  at this point in time.  Noninvasive studies do not suggest clinically significant change.  No invasive studies, angiography or surgery at this time The patient should continue walking and begin a more formal exercise program.  The patient should continue antiplatelet therapy and aggressive treatment of the lipid abnormalities  No changes in the patient's medications at this time  Continued surveillance is indicated as atherosclerosis is likely to progress with time.    The patient will continue follow up with noninvasive studies as ordered.   2. Chronic obstructive pulmonary disease, unspecified COPD type (HCC) Continue pulmonary medications and aerosols as already ordered, these medications have been reviewed and there are no changes at this time.   3. Acquired hypothyroidism Continue hormone replacement as ordered and reviewed, no changes at this time  4. Mixed hyperlipidemia Continue statin as ordered and reviewed, no changes at this time    Levora Dredge, MD  07/09/2023 2:40 PM

## 2023-07-10 ENCOUNTER — Other Ambulatory Visit: Payer: Self-pay

## 2023-07-10 ENCOUNTER — Encounter: Payer: Self-pay | Admitting: Internal Medicine

## 2023-07-10 ENCOUNTER — Inpatient Hospital Stay (HOSPITAL_BASED_OUTPATIENT_CLINIC_OR_DEPARTMENT_OTHER): Payer: Medicare Other | Admitting: Internal Medicine

## 2023-07-10 VITALS — BP 121/67 | HR 79 | Temp 98.9°F | Resp 20 | Wt 213.7 lb

## 2023-07-10 DIAGNOSIS — C7931 Secondary malignant neoplasm of brain: Secondary | ICD-10-CM | POA: Diagnosis not present

## 2023-07-10 DIAGNOSIS — Z5112 Encounter for antineoplastic immunotherapy: Secondary | ICD-10-CM | POA: Diagnosis not present

## 2023-07-10 MED ORDER — DEXAMETHASONE 4 MG PO TABS
4.0000 mg | ORAL_TABLET | Freq: Every day | ORAL | 0 refills | Status: DC
Start: 2023-07-27 — End: 2023-08-13

## 2023-07-10 NOTE — Progress Notes (Signed)
[facility] [address]  [place], [place] [address] [phone]   Interval Evaluation  Date of Service: 07/10/23 Patient Name: Joshua [name] Patient MRN: [id number] Patient DOB: 1955/10/06 Provider: Henreitta Leber, MD  Identifying Statement:  [name] is a 67 y.o. male with Malignant neoplasm metastatic to brain [facility])    Primary Cancer:  Oncologic History: Oncology History  Squamous cell carcinoma of right lung (HCC)  09/18/2020 Imaging   PET scan showed right upper lobe nodule maximal SUV 11.5.  Hypermetabolic cluster right paratracheal adenopathy with maximum SUV up to 12.  No findings of metastatic disease to the neck/abdomen/pelvis or skeleton.   09/20/2020 Initial Diagnosis   Squamous cell carcinoma of right lung (HCC) NGS showed PD-L1 TPS 95%, LRP1B S963fs, PIK3CA E545K, RB1 c1422-2A>T, RIT1 T83_ A84del, STK11 P284fs, TPS53 R248W, TMB 19.33mut/mb, MS stable.    09/20/2020 Cancer Staging   Staging form: Lung, AJCC 8th Edition - Clinical stage from 09/20/2020: Stage IV (cT1b, cN2, cM1) - Signed by [name], MD on 12/13/2021   10/03/2020 Imaging   MRI brain with and without contrast showed no evidence of intracranial metastatic disease. Well-defined round lesion within the left parotid tail measuring approximately 2.3 x 1.8 x 1.8 cm   10/09/2020 - 11/27/2020 Radiation Therapy   concurrent chemoradiation   10/10/2020 - 10/31/2020 Chemotherapy   10/10/2020 concurrent chemoradiation.  carboplatin AUC of 2 and Taxol 45 mg/m2 x 4.  Additional chemotherapy was held due to thrombocytopenia, neutropenia       12/31/2020 - 03/21/2022 Chemotherapy   Maintenance  Durvalumab q14d      12/31/2020 -  Chemotherapy   Patient is on Treatment Plan : LUNG Durvalumab (10) q14d     03/28/2021 Imaging   CT without contrast showed new linear right perihilar consolidation with associated traction bronchiectasis.  Likely postradiation changes.   Additional new peribronchial vascular/nodular groundglass opacities with areas of consolidation or seen primarily in the right upper and lower lobes, more distant from expected radiation fields. 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  Per my discussion with pulmonology Dr. 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     05/09/2021 Imaging   Repeat CT chest without contrast showed evolving radiation changes in the right perihilar region likely accounting for interval increase to thickening of the posterior wall of the right upper lobe bronchus.  The more peripheral right lung groundglass opacities seen on most recent study have generally improved with exception of one peripherally right upper lobe which appears new.  These are likely inflammatory.  No findings highly suspicious for local recurrence.   05/30/2021 - 01/10/2022 Chemotherapy   Durvalumab q14d     10/07/2021 Imaging   MRI brain with and without contrast showed 2 contrast-enhancing lesions, most consistent with metastatic disease.  Largest lesion is in the left frontal lobe and measures up to 1.3 cm with a large amount of surrounding edema and 9 mm rightward midline shift.  The other enhancing lesion was 4 mm in the right occipital lobe with a small amount of surrounding edema.  No acute or chronic hemorrhage.   10/07/2021 Progression   Headache after colonoscopy, progressive difficulty using her right arm.  Progressively worsening of speech. Stage IV lung cancer with brain metastasis.  started on Dexamethasone.  He was seen by neurosurgery and radiation oncology.  Recommendation is  to proceed with SRS.  Patient was discharged with dexamethasone   10/16/2021 Imaging   PET scan showed postradiation changes in the right supra hilar lung without evidence of local recurrence.  No evidence of lung cancer recurrence  or metastatic disease.  Single focus of intense metabolic activity associated with posterior left 11th rib likely secondary to recent fall/posttraumatic metabolic changes.  Frontal  uptake in the left frontal lobe corresponding to the known metastatic lesion in the brain   10/29/2021 -  Radiation Therapy   Brain Radiation [facility]   12/10/2021 Imaging   Brain MRI: Continued interval decrease in size of a left frontal lobe metastasis, now measuring 10 mm. Moderate surrounding edema has also decreased.  3-4 mm metastasis within the right occipital lobe, not significantly changed in size. There is now a small focus of central non enhancement within this lesion. Increased edema. 3 mm enhancing metastasis within the high left parietal lobe. In retrospect, this was likely present as a subtle punctate focus of enhancement on the prior brain MRI of 10/22/2021.   01/18/2022 Imaging   CT chest abdomen pelvis w contrast 1. Stable post treatment changes in the right upper lobe medially with dense radiation fibrosis but no findings to suggest local recurrent tumor. 2. Significant progression of ill-defined ground-glass opacity, interstitial thickening and airspace nodularity in the right upper lobe and upper aspect of the right lower lobe. Findings could reflect an inflammatory or atypical infectious process, drug induced pneumonitis or interstitial spread of tumor.3. No mediastinal or hilar mass or adenopathy. 4. No findings for abdominal/pelvic metastatic disease or osseous metastatic disease.   02/19/2022 Imaging   MRI brain w wo contrast showed Three metastatic deposits in the brain appear smaller. Decreased edema in the left parietal lobe. No new lesions    04/04/2022 Imaging   CT chest w contrast 1. Stable radiation changes in the right perihilar region without evidence of local recurrence. 2. Interval improvement in the patchy ground-glass opacities previously demonstrated in both lungs, most consistent with a  resolving infectious or inflammatory process (including immunotherapy-related pneumonitis). 3. No evidence of metastatic disease.   05/14/2022 Miscellaneous   He had a mechanical fall and fractured his left elbow. xrays showed a left elbow fracture  05/26/22 seen by orthopedic surgeon, cast applied.    05/27/2022 Imaging   MRI brain w wo contrast 1. All three known brain metastases have enlarged since August, with increasing regional edema. And there is a small new left anterior cerebellar 4-5 mm metastasis (was questionably punctate on the May MRI). This constellation could reflect a combination of True Progression (Left Cerebellar lesion) and pseudoprogression/radionecrosis (3 existing mets).    2. No significant intracranial mass effect. 3. Underlying moderate cerebral white matter signal changes.   05/30/2022 Imaging   CT chest abdomen pelvis w contrast  1. 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 There is a stable small right pleural effusion, but no definitive findings to suggest residual or recurrent disease on today's examination. 2. No signs of metastatic disease in the abdomen or pelvis.  3. Aortic atherosclerosis, in addition to two-vessel coronary artery disease. Please note that although the presence of coronary artery calcium documents the presence of coronary artery disease, the severity of this disease and any potential stenosis cannot be assessed on this non-gated CT examination. Assessment for potential risk factor modification, dietary therapy or pharmacologic therapy may be warranted, if clinically indicated.  4. Hepatic steatosis. 5. Colonic diverticulosis without evidence of acute diverticulitis at this time. 6. Incompletely imaged but apparently occluded vascular graft in the superior aspect of the right anterior thigh. Referral to vascular specialist  for further clinical evaluation should be considered if clinically appropriate. 7. Additional incidental findings, as above.   07/22/2022 Imaging   MRI brain  1. No new or enlarging lesions. No intracranial disease progression. 2. Decreased size of treated lesions of the left frontoparietal junction and left postcentral gyrus. 3. Unchanged lesions in the left cerebellum and right occipital lobe.   10/15/2022 Imaging   MRI brain w wo contrast showed Interval increase in size of multiple intraparenchymal lesions with worsened surrounding vasogenic edema. 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 No new lesions   11/05/2022 Imaging   MRI brain w wo contrast  1. Likely slight increase and right occipital and left cerebellar lesions. Otherwise, unchanged metastases, as above. No new lesions identified. 2. Partially imaged large probable lymph node in the left upper neck, apparent on prior PET CT.   11/13/2022 -  Radiation Therapy   Each of the sites were treated with SRS/SBRT/SRT-IMRT technique to 20 Gy in 1 fraction to each of the following sites: PTV_2_LCerebel_77mm PTV_3_RCerebel_30mm PTV_4_ROccip_72mm PTV_5_LParietal_26mm   11/27/2022 Imaging   CT chest abdomen pelvis w contrast showed  Improving bilateral scattered interstitial and ground-glass opacities. Small amount of residual areas in the extreme left lower lobe inferiorly. This specific area is slightly increased from previous but overall the amount of opacities is significantly improved. Recommend continued follow-up. No developing new mass lesion, fluid collection or lymph node enlargement. Stable posttreatment changes right perihilar in the lung. Fatty liver infiltration. Colonic diverticulosis. Improved tiny right pleural effusion   02/05/2023 Imaging   MRI brain with and without contrast Interval decrease in all of the metastatic lesions, detailed above.No  new lesions identified.    Imaging   CT chest abdomen pelvis w contrast showed  1. 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 2. Trace right pleural fluid or thickening, new or minimally increased. 3. Incidental findings, including: Aortic atherosclerosis (ICD10-I70.0), coronary artery atherosclerosis and emphysema (ICD10-J43.9). Hepatic steatosis.   06/25/2023 Imaging   CT chest abdomen pelvis w contrast showed 1. Unchanged post treatment/post radiation appearance of the right lung, with perihilar and suprahilar paramedian fibrosis and consolidation. 2. No evidence of lymphadenopathy or metastatic disease in the chest, abdomen, or pelvis. 3. Hepatic steatosis. 4. Coronary artery disease.   Aortic Atherosclerosis (ICD10-I70.0) and Emphysema (ICD10-J43.9).    CNS Oncologic History 10/29/21: SRS to left frontal metastasis ([place]) 11/13/22: salvage SRS x5 with Dr. Mitzi Hansen  Interval History: [name] presents today for follow up after recent MRI brain. No further episodes of weakness or numbness, but continues to have issues with clumsiness in his right hand.  He is worried about dropping things.  Otherwise no new or progressive changes, still has sporadic headaches.  Continues to ambulate with a cane as prior.  Remains on Imfinzi immunotherapy with Dr. Cathie Hoops, no longer dosing decadron.  H+P (06/20/22) Patient presents today for evaluation for neurologic complaints.  He describes gradual onset of impaired speech output and right arm greater than right leg weakness.  This led to a fall and an injury to his left arm.  He was started on decadron two weeks ago, this has led to considerable improvement in symptoms.  Today he feels  at his prior baseline.  No issues tolerating the steroids.  He is due for durvalumab infusion today with Dr. Cathie Hoops.   Medications: Current Outpatient Medications on File Prior to Visit  Medication  Sig Dispense Refill   aspirin EC 81 MG tablet Take 81 mg by mouth daily. Swallow whole.     clopidogrel (PLAVIX) 75 MG tablet TAKE 1 TABLET BY MOUTH DAILY 100 tablet 2   levothyroxine (SYNTHROID) 125 MCG tablet Take 1 tablet (125 mcg total) by mouth daily before breakfast. 90 tablet 0   potassium chloride (KLOR-CON M) 10 MEQ tablet Take 1 tablet (10 mEq total) by mouth 2 (two) times daily. 30 tablet 1   rosuvastatin (CRESTOR) 40 MG tablet Take 1 tablet (40 mg total) by mouth daily. 90 tablet 3   Vitamin D, Ergocalciferol, (DRISDOL) 1.25 MG (50000 UNIT) CAPS capsule Take 1 capsule (50,000 Units total) by mouth every 7 (seven) days. 13 capsule 3   lisinopril (ZESTRIL) 20 MG tablet Take 1 tablet (20 mg total) by mouth daily. 90 tablet 3   metFORMIN (GLUCOPHAGE-XR) 750 MG 24 hr tablet Take 1 tablet (750 mg total) by mouth daily with breakfast. 90 tablet 3   Current Facility-Administered Medications on File Prior to Visit  Medication Dose Route Frequency Provider Last Rate Last Admin   albuterol (PROVENTIL) (2.5 MG/3ML) 0.083% nebulizer solution 2.5 mg  2.5 mg Nebulization Once Salena Saner, MD       heparin lock flush 100 unit/mL  500 Units Intracatheter Once PRN [name], MD        Allergies: No Known Allergies Past Medical History:  Past Medical History:  Diagnosis Date   Allergy    Cancer (HCC)    lung   COPD (chronic obstructive pulmonary disease) (HCC)    Dyspnea    former smoker. 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   Peripheral vascular disease (HCC)    Pre-diabetes    Sleep apnea    Past Surgical History:  Past Surgical History:  Procedure Laterality Date   ANTERIOR CERVICAL DECOMP/DISCECTOMY FUSION N/A 03/14/2020   Procedure: ANTERIOR CERVICAL DECOMPRESSION/DISCECTOMY FUSION 3 LEVELS C4-7;  Surgeon: Venetia Night, MD;  Location: [facility] ORS;  Service: Neurosurgery;  Laterality: N/A;   BACK SURGERY  2011    CENTRAL LINE INSERTION Right 09/10/2016   Procedure: CENTRAL LINE INSERTION;  Surgeon: Renford Dills, MD;  Location: [facility] OR[facility];  Service: Vascular;  Laterality: Right;   CERVICAL FUSION     C4-c7 on Mar 14, 2020   COLONOSCOPY  2013 ?   COLONOSCOPY N/A 09/09/2021   Procedure: COLONOSCOPY;  Surgeon: Jaynie Collins, DO;  Location: [facility] ENDOSCOPY;  Service: Gastroenterology;  Laterality: N/A;   CORONARY ANGIOPLASTY     ESOPHAGOGASTRODUODENOSCOPY N/A 09/09/2021   Procedure: ESOPHAGOGASTRODUODENOSCOPY (EGD);  Surgeon: Jaynie Collins, DO;  Location: [facility] ENDOSCOPY;  Service: Gastroenterology;  Laterality: N/A;   FEMORAL-POPLITEAL BYPASS GRAFT Right 09/10/2016   Procedure: BYPASS GRAFT FEMORAL-POPLITEAL ARTERY ( BELOW KNEE );  Surgeon: Renford Dills, MD;  Location: [facility] ORS;  Service: Vascular;  Laterality: Right;   FEMORAL-TIBIAL BYPASS GRAFT Right 03/23/2020   Procedure: BYPASS GRAFT RIGHT FEMORAL- DISTAL TIBIAL ARTERY WITH DISTAL FLOW GRAFT;  Surgeon: Renford Dills, MD;  Location: [facility]S;  Service: Vascular;  Laterality: Right;   LEFT HEART CATH AND CORONARY ANGIOGRAPHY Left 05/16/2021   Procedure: LEFT HEART CATH AND CORONARY ANGIOGRAPHY;  Surgeon:  Armando Reichert, MD;  Location: [facility] INVASIVE CV LAB;  Service: Cardiovascular;  Laterality: Left;   LOWER EXTREMITY ANGIOGRAPHY Right 11/18/2016   Procedure: Lower Extremity Angiography;  Surgeon: Renford Dills, MD;  Location: [facility] INVASIVE CV LAB;  Service: Cardiovascular;  Laterality: Right;   LOWER EXTREMITY ANGIOGRAPHY Right 12/09/2016   Procedure: Lower Extremity Angiography;  Surgeon: Renford Dills, MD;  Location: [facility] INVASIVE CV LAB;  Service: Cardiovascular;  Laterality: Right;   LOWER EXTREMITY ANGIOGRAPHY Right 11/15/2019   Procedure: LOWER EXTREMITY ANGIOGRAPHY;  Surgeon: Renford Dills, MD;  Location: [facility] INVASIVE CV LAB;  Service: Cardiovascular;  Laterality: Right;   LOWER EXTREMITY ANGIOGRAPHY Right  12/01/2019   Procedure: Lower Extremity Angiography;  Surgeon: Annice Needy, MD;  Location: [facility] INVASIVE CV LAB;  Service: Cardiovascular;  Laterality: Right;   LOWER EXTREMITY ANGIOGRAPHY Right 12/02/2019   Procedure: LOWER EXTREMITY ANGIOGRAPHY;  Surgeon: Renford Dills, MD;  Location: [facility] INVASIVE CV LAB;  Service: Cardiovascular;  Laterality: Right;   LOWER EXTREMITY ANGIOGRAPHY Right 03/23/2020   Procedure: Lower Extremity Angiography;  Surgeon: Renford Dills, MD;  Location: [facility] INVASIVE CV LAB;  Service: Cardiovascular;  Laterality: Right;   LOWER EXTREMITY ANGIOGRAPHY Right 09/25/2021   Procedure: Lower Extremity Angiography;  Surgeon: Renford Dills, MD;  Location: [facility] INVASIVE CV LAB;  Service: Cardiovascular;  Laterality: Right;   LOWER EXTREMITY ANGIOGRAPHY Right 09/26/2021   Procedure: Lower Extremity Angiography;  Surgeon: Annice Needy, MD;  Location: [facility] INVASIVE [facility] LAB;  Service: Cardiovascular;  Laterality: Right;   LOWER EXTREMITY ANGIOGRAPHY Right 04/08/2022   Procedure: Lower Extremity Angiography;  Surgeon: Renford Dills, MD;  Location: [facility] INVASIVE CV LAB;  Service: Cardiovascular;  Laterality: Right;   LOWER EXTREMITY INTERVENTION  11/18/2016   Procedure: Lower Extremity Intervention;  Surgeon: Renford Dills, MD;  Location: [facility] INVASIVE CV LAB;  Service: Cardiovascular;;   PERIPHERAL VASCULAR CATHETERIZATION Right 01/02/2015   Procedure: Lower Extremity Angiography;  Surgeon: Renford Dills, MD;  Location: [facility] INVASIVE CV LAB;  Service: Cardiovascular;  Laterality: Right;   PERIPHERAL VASCULAR CATHETERIZATION Right 06/18/2015   Procedure: Lower Extremity Angiography;  Surgeon: Annice Needy, MD;  Location: [facility] INVAS[facility] LAB;  Service: Cardiovascular;  Laterality: Right;   PERIPHERAL VASCULAR CATHETERIZATION  06/18/2015   Procedure: Lower Extremity Intervention;  Surgeon: Annice Needy, MD;  Location: [facility] INVASIVE CV LAB;  Service: Cardiovascular;;    PERIPHERAL VASCULAR CATHETERIZATION N/A 10/09/2015   Procedure: Abdominal Aortogram w/Lower Extremity;  Surgeon: Renford Dills, MD;  Location: [facility] INV[facility] LAB;  Service: Cardiovascular;  Laterality: N/A;   PERIPHERAL VASCULAR CATHETERIZATION  10/09/2015   Procedure: Lower Extremity Intervention;  Surgeon: Renford Dills, MD;  Location: [facility] INV[facility] LAB;  Service: Cardiovascular;;   PERIPHERAL VASCULAR CATHETERIZATION Right 10/10/2015   Procedure: Lower Extremity Angiography;  Surgeon: Annice Needy, MD;  Location: [facility] IN[facility];  Service: Cardiovascular;  Laterality: Right;   PERIPHERAL VASCULAR CATHETERIZATION Right 10/10/2015   Procedure: Lower Extremity Intervention;  Surgeon: Annice Needy, MD;  Location: [facility];  Service: Cardiovascular;  Laterality: Right;   PERIPHERAL VASCULAR CATHETERIZATION Right 12/03/2015   Procedure: Lower Extremity Angiography;  Surgeon: Annice Needy, MD;  Location: [facility];  Service: Cardiovascular;  Laterality: Right;   PERIPHERAL VASCULAR CATHETERIZATION Right 12/04/2015   Procedure: Lower Extremity Angiography;  Surgeon: Annice Needy, MD;  Location: [facility];  Service: Cardiovascular;  Laterality: Right;  PERIPHERAL VASCULAR CATHETERIZATION  12/04/2015   Procedure: Lower Extremity Intervention;  Surgeon: Annice Needy, MD;  Location: [facility];  Service: Cardiovascular;;   PERIPHERAL VASCULAR CATHETERIZATION Right 06/30/2016   Procedure: Lower Extremity Angiography;  Surgeon: Annice Needy, MD;  Location: [facility] INV[facility] LAB;  Service: Cardiovascular;  Laterality: Right;   PERIPHERAL VASCULAR CATHETERIZATION Right 06/25/2016   Procedure: Lower Extremity Angiography;  Surgeon: Renford Dills, MD;  Location: [facility] IN[facility];  Service: Cardiovascular;  Laterality: Right;   PERIPHERAL VASCULAR CATHETERIZATION Right 07/01/2016   Procedure: Lower Extremity Angiography;  Surgeon: Renford Dills, MD;   Location: [facility] IN[facility];  Service: Cardiovascular;  Laterality: Right;   PERIPHERAL VASCULAR CATHETERIZATION  07/01/2016   Procedure: Lower Extremity Intervention;  Surgeon: Renford Dills, MD;  Location: [facility] IN[facility];  Service: Cardiovascular;;   PERIPHERAL VASCULAR CATHETERIZATION Right 08/01/2016   Procedure: Lower Extremity Angiography;  Surgeon: Renford Dills, MD;  Location: [facility] IN[facility];  Service: Cardiovascular;  Laterality: Right;   PORTA CATH INSERTION N/A 10/05/2020   Procedure: PORTA CATH INSERTION;  Surgeon: Renford Dills, MD;  Location: [facility] INV[facility]IVE CV LAB;  Service: Cardiovascular;  Laterality: N/A;   stent placement in right leg Right    VIDEO BRONCHOSCOPY N/A 09/14/2020   Procedure: VIDEO BRONCHOSCOPY WITH FLUORO;  Surgeon: Salena Saner, MD;  Location: [facility];  Service: Cardiopulmonary;  Laterality: N/A;   VIDEO BRONCHOSCOPY WITH ENDOBRONCHIAL ULTRASOUND N/A 09/14/2020   Procedure: VIDEO BRONCHOSCOPY WITH ENDOBRONCHIAL ULTRASOUND;  Surgeon: Salena Saner, MD;  Location: [facility];  Service: Cardiopulmonary;  Laterality: N/A;   Social History:  Social History   Socioeconomic History   Marital status: Single    Spouse name: Not on file   Number of children: Not on file   Years of education: Not on file   Highest education level: Not on file  Occupational History   Occupation: unemployed  Tobacco Use   Smoking status: Former    Current packs/day: 0.00    Average packs/day: 2.0 packs/day for 38.0 years (76.0 ttl pk-yrs)    Types: Cigarettes    Start date: 12/19/1976    Quit date: 12/20/2014    Years since quitting: 8.5   Smokeless tobacco: Never   Tobacco comments:    quit smoking in 2017 most smoked 2   Vaping Use   Vaping status: Former  Substance and Sexual Activity   Alcohol use: Not Currently   Drug use: No   Sexual activity: Yes  Other Topics Concern   Not on file  Social History Narrative   Not on file   Social  Drivers of Health   Financial Resource Strain: Patient Declined (05/29/2023)   Overall Financial Resource Strain (CARDIA)    Difficulty of Paying Living Expenses: Patient declined  Food Insecurity: Patient Declined (05/29/2023)   Hunger Vital Sign    Worried About Running Out of Food in the Last Year: Patient declined    Ran Out of Food in the Last Year: Patient declined  Transportation Needs: No Transportation Needs (05/29/2023)   PRAPARE - Administrator, Civil Service (Medical): No    Lack of Transportation (Non-Medical): No  Physical Activity: Insufficiently Active (05/29/2023)   Exercise Vital Sign    Days of Exercise per Week: 2 days    Minutes of Exercise per Session: 10 min  Stress: No Stress Concern Present (05/29/2023)   [employer] of Occupational Health - Occupational  Stress Questionnaire    Feeling of Stress : Not at all  Social Connections: Unknown (05/29/2023)   Social Connection and Isolation Panel [NHANES]    Frequency of Communication with Friends and Family: More than three times a week    Frequency of Social Gatherings with Friends and Family: Three times a week    Attends Religious Services: Patient declined    Active Member of Clubs or Organizations: No    Attends Engineer, structural: Not on file    Marital Status: Patient declined  Catering manager Violence: Not on file   Family History:  Family History  Problem Relation Age of Onset   Diabetes Mother    Hypertension Mother    Heart murmur Mother    Leukemia Mother    Throat cancer Maternal Grandmother    Colon cancer Neg Hx    Breast cancer Neg Hx     Review of Systems: Constitutional: Doesn't report fevers, chills or abnormal weight loss Eyes: Doesn't report blurriness of vision Ears, nose, mouth, throat, and face: Doesn't report sore throat Respiratory: Doesn't report cough, dyspnea or wheezes Cardiovascular: Doesn't report palpitation, chest discomfort  Gastrointestinal:   Doesn't report nausea, constipation, diarrhea GU: Doesn't report incontinence Skin: Doesn't report skin rashes Neurological: Per HPI Musculoskeletal: Doesn't report joint pain Behavioral/Psych: Doesn't report anxiety  Physical Exam: Vitals:   07/10/23 1127  BP: 121/67  Pulse: 79  Resp: 20  Temp: 98.9 F (37.2 C)  SpO2: 100%   KPS: 80. General: Alert, cooperative, pleasant, in no acute distress Head: Normal EENT: No conjunctival injection or scleral icterus.  Lungs: Resp effort normal Cardiac: Regular rate Abdomen: Non-distended abdomen Skin: No rashes cyanosis or petechiae. Extremities: Left arm in sling  Neurologic Exam: Mental Status: Awake, alert, attentive to examiner. Oriented to self and environment. Language is fluent with intact comprehension.  Cranial Nerves: Visual acuity is grossly normal. Visual fields are full. Extra-ocular movements intact. No ptosis. Face is symmetric Motor: Tone and bulk are normal. Power is full in both arms and legs, with some impairment in fine motor function in right hand. Reflexes are symmetric, no pathologic reflexes present.  Sensory: Intact to light touch Gait: Normal.   Labs: I have reviewed the data as listed    Component Value Date/Time   NA 137 07/03/2023 0856   NA 138 06/02/2023 1049   K 4.1 07/03/2023 0856   CL 104 07/03/2023 0856   CO2 24 07/03/2023 0856   GLUCOSE 130 (H) 07/03/2023 0856   BUN 17 07/03/2023 0856   BUN 18 06/02/2023 1049   BUN 16 07/03/2014 1205   CREATININE 1.07 07/03/2023 0856   CREATININE 1.11 11/06/2022 1041   CREATININE 1.34 (H) 07/03/2014 1205   CALCIUM 9.2 07/03/2023 0856   PROT 7.0 07/03/2023 0856   PROT 7.0 04/16/2020 1525   ALBUMIN 4.0 07/03/2023 0856   ALBUMIN 4.3 04/16/2020 1525   AST 22 07/03/2023 0856   ALT 20 07/03/2023 0856   ALKPHOS 83 07/03/2023 0856   BILITOT 0.4 07/03/2023 0856   BILITOT 0.4 04/16/2020 1525   GFRNONAA >60 07/03/2023 0856   GFRNONAA >60 11/06/2022 1041    GFRNONAA 59 (L) 07/03/2014 1205   GFRAA >60 03/30/2020 0536   GFRAA >60 07/03/2014 1205   Lab Results  Component Value Date   WBC 5.9 07/03/2023   NEUTROABS 4.1 07/03/2023   HGB 13.3 07/03/2023   HCT 40.4 07/03/2023   MCV 82.6 07/03/2023   PLT 214 07/03/2023  Imaging:  [facility] Clinician Interpretation: I have personally reviewed the CNS images as listed.  My interpretation, in the context of the patient's clinical presentation, is treatment effect vs true progression   VAS [place] ABI WITH/WO TBI Result Date: 07/09/2023  LOWER EXTREMITY DOPPLER STUDY Patient Name:  Joshua [name]  Date of Exam:   07/09/2023 Medical Rec #: [id number]            Accession #:    [id number] Date of Birth: [birth year]           Patient Gender: M Patient Age:   38 years Exam Location:  [facility] Procedure:      VAS [place] ABI WITH/WO TBI Referring Phys: [facility] --------------------------------------------------------------------------------  Indications: Peripheral artery disease, and Routine f/u. Patient reports no              claudication or lower extremity pain. High Risk Factors: Hyperlipidemia, past history of smoking. Other Factors: ASO s/p multiple interventions and bypass.                11/15/2019 Right thrombectomy of Fempop insitu graft with added                stent throughout.                 12/01/2019: Aortogram and Selective Right Lower Extremity                Angiogram. 6 mg of TPA delivered in a lysis catheterfrom the                Origin of the Right Femoral -Popliteal bypass down the proximal                ATA with placement of the lysis.                 12/02/2019: PTA of the Right Posterior Tibial Artery. PTA of the                Right Peroneal Artery. Mechanical Thrombectomy of the Right                Femoral -Popliteal Bypass graft. Mechanical Thrombectomy of the                Right Tibioperoneal Trunk and Posterior Tibial Artery.                 09/25/2021: Right Lower  Extremity Angiography third order                catheter placement. PTA of the proximal peroneal and                tibioperoneal trunk to 2.5 mm with an ultra score balloon. PTA of                the Femoral Artery and the proximal anastomosis of the bypass                graft to 4 mm with an ultra score balloon. Administration of 5 mg                of tpa throughout the entire bypass and in the peroneal Artery.                Initiation of continuous tpa infusion Right lower Extremity for                limb salvage. Insertion of a triple  lumen catheter Left CFV with                [place] and fluoroscopic guidance.                 09/26/2021: Right LE Angiogram. PTA of the Right Posterior Tibial                Artery with 2.5 mm diameter by 30 cm length angioplasty balloon.                PTA of the distal bypass anastomosis and distal bypass graft                analogous to the Popliteal artery with 4 mm diameter by 8 cm                length Lutonix drug coated angioplasty balloon.                04/08/2022 right thrombectomy of the Deep profunda.  Vascular Interventions: Hx of multiple right lower extremity PTA's/stents.                         09/10/2016 Right femoral to below the knee popliteal                         artery bypass graft placement. 5/01 Right bypass graft &                         mid popliteal artery PTA & coil embolectomy of large                         branch of bypass graft. 12/09/16 Right distal bypass                         graft PTA/stent and right posterior tibial artery PTA.                         03/23/2020 Rt PTA thrombectomy. Rt Profunda fem artery to                         PTA bypass. Performing Technologist: Hardie Lora RVT  Examination Guidelines: A complete evaluation includes at minimum, Doppler waveform signals and systolic blood pressure reading at the level of bilateral brachial, anterior tibial, and posterior tibial arteries, when vessel segments are accessible.  Bilateral testing is considered an integral part of a complete examination. Photoelectric Plethysmograph (PPG) waveforms and toe systolic pressure readings are included as required and additional duplex testing as needed. Limited examinations for reoccurring indications may be performed as noted.  ABI Findings: +---------+------------------+-----+----------+--------+ Right    Rt Pressure (mmHg)IndexWaveform  Comment  +---------+------------------+-----+----------+--------+ Brachial 134                                       +---------+------------------+-----+----------+--------+ PTA      59                0.44 monophasic         +---------+------------------+-----+----------+--------+ DP       47                0.35 monophasic         +---------+------------------+-----+----------+--------+  Great Toe32                0.24                    +---------+------------------+-----+----------+--------+ +---------+------------------+-----+---------+-------+ Left     Lt Pressure (mmHg)IndexWaveform Comment +---------+------------------+-----+---------+-------+ Brachial 131                                     +---------+------------------+-----+---------+-------+ PTA      151               1.13 triphasic        +---------+------------------+-----+---------+-------+ DP       148               1.10 triphasic        +---------+------------------+-----+---------+-------+ Great Toe82                0.61                  +---------+------------------+-----+---------+-------+ +-------+-----------+-----------+------------+------------+ ABI/TBIToday's ABIToday's TBIPrevious ABIPrevious TBI +-------+-----------+-----------+------------+------------+ Right  0.44       0.24       0.45        0.43         +-------+-----------+-----------+------------+------------+ Left   1.13       0.61       1.03        1.02          +-------+-----------+-----------+------------+------------+  Bilateral ABIs appear essentially unchanged compared to prior study on 01/05/2023.  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 The right toe-brachial index is abnormal. Left: Resting left ankle-brachial index is within normal range. The left toe-brachial index is abnormal. *See table(s) above for measurements and observations.  Electronically signed by Levora Dredge MD on 07/09/2023 at 4:17:26 PM.    Final    MR BRAIN W WO CONTRAST Result Date: 07/03/2023 CLINICAL DATA:  Brain/CNS neoplasm, assess treatment response. Metastatic lung cancer, restaging. EXAM: MRI HEAD WITHOUT AND WITH CONTRAST TECHNIQUE: Multiplanar, multiecho pulse sequences of the brain and surrounding structures were obtained without and with intravenous contrast. CONTRAST:  9mL GADAVIST GADOBUTROL 1 MMOL/ML IV SOLN COMPARISON:  MRI brain 04/16/2023. FINDINGS: Brain: Continued increase in size of the peripherally enhancing mass centered within the medial aspect of the left postcentral gyrus, now measuring up to 22 mm (coronal image 14 series 17), previously 17 mm. Associated increase in surrounding vasogenic edema. Overall unchanged size but decreased central necrotic component of the 17 mm lesion in the lateral aspect of the left precentral gyrus (coronal image 24 series 17) with associated increase in surrounding vasogenic edema. Unchanged peripherally enhancing lesion in the right occipital lobe (axial image 65 series 16). Slightly decreased punctate enhancing lesion in anterior aspect of the left cerebellar hemisphere (axial image 44 series 16). No acute infarct or hemorrhage. No hydrocephalus or extra-axial collection. No midline shift. Vascular: Normal flow voids and vessel enhancement. Skull and upper cervical spine: Normal marrow signal and enhancement. Sinuses/Orbits: No acute findings. Other: None.  IMPRESSION: 1. Continued increase in size of the peripherally enhancing mass centered within the medial aspect of the left postcentral gyrus, now measuring up to 22 mm, previously 17 mm. Associated increase in surrounding vasogenic edema. 2. Overall unchanged size but decreased central necrotic component of the 17 mm lesion in the lateral aspect of the left precentral gyrus with  associated increase in surrounding vasogenic edema. 3. Unchanged peripherally enhancing lesion in the right occipital lobe. Slightly decreased punctate enhancing lesion in the anterior aspect of the left cerebellar hemisphere. Electronically Signed   By: Orvan Falconer M.D.   On: 07/03/2023 13:47   CT CHEST ABDOMEN PELVIS W CONTRAST Result Date: 06/29/2023 CLINICAL DATA:  Lung cancer restaging * Tracking Code: BO * EXAM: CT CHEST, ABDOMEN, AND PELVIS WITH CONTRAST TECHNIQUE: Multidetector CT imaging of the chest, abdomen and pelvis was performed following the standard protocol during bolus administration of intravenous contrast. RADIATION DOSE REDUCTION: This exam was performed according to the departmental dose-optimization program which includes automated exposure control, adjustment of the mA and/or kV according to patient size and/or use of iterative reconstruction technique. CONTRAST:  OMNIPAQUE IOHEXOL 300 MG/ML  SOLN COMPARISON:  03/12/2023 FINDINGS: CT CHEST FINDINGS Cardiovascular: Right chest port catheter. Normal heart size. Left and right coronary artery calcifications. No pericardial effusion. Mediastinum/Nodes: No enlarged mediastinal, hilar, or axillary lymph nodes. Thyroid gland, trachea, and esophagus demonstrate no significant findings. Lungs/Pleura: Minimal paraseptal emphysema. Unchanged post treatment/post radiation appearance of the right lung, with perihilar and suprahilar paramedian fibrosis and consolidation (series 4, image 59). No pleural effusion or pneumothorax. Musculoskeletal: No chest wall  abnormality. No acute osseous findings. CT ABDOMEN PELVIS FINDINGS Hepatobiliary: No solid liver abnormality is seen. Hepatic steatosis. No gallstones, gallbladder wall thickening, or biliary dilatation. Pancreas: Unremarkable. No pancreatic ductal dilatation or surrounding inflammatory changes. Spleen: Normal in size without significant abnormality. Adrenals/Urinary Tract: Adrenal glands are unremarkable. Kidneys are normal, without renal calculi, solid lesion, or hydronephrosis. Bladder is unremarkable. Stomach/Bowel: Stomach is within normal limits. Appendix appears normal. No evidence of bowel wall thickening, distention, or inflammatory changes. Sigmoid diverticulosis. Vascular/Lymphatic: Aortic atherosclerosis. No enlarged abdominal or pelvic lymph nodes. Reproductive: No mass or other abnormality. Other: No abdominal wall hernia or abnormality. No ascites. Musculoskeletal: No acute osseous findings. IMPRESSION: 1. Unchanged post treatment/post radiation appearance of the right lung, with perihilar and suprahilar paramedian fibrosis and consolidation. 2. No evidence of lymphadenopathy or metastatic disease in the chest, abdomen, or pelvis. 3. Hepatic steatosis. 4. Coronary artery disease. Aortic Atherosclerosis (ICD10-I70.0) and Emphysema (ICD10-J43.9). Electronically Signed   By: Jearld Lesch M.D.   On: 06/29/2023 07:39    Assessment/Plan Malignant neoplasm metastatic to brain [facility])  [name] is clinically stable today, now 8 months removed from salvage SRS to 5 additional metastatic foci.    MRI brain demonstrates progression of treated lesion in left post-central gyrus with increased surrounding T2/FLAIR signal abnormality.  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  Recommended the following: -Dosing 4mg  daily decadron x10 days, starting 07/27/23 to avoid immunotherapy administration -Repeat MRI brain at the end of that day rx, 08/05/23 -RTC for review -If  improved, this is likely RN.  If progression continues, will consider re-treatment+avastin -Not good location for surgical intervention or LITT   We appreciate the opportunity to participate in the care of [employer].   We ask that [name] return to clinic in 1 months following brain MRI, or sooner as needed.  All questions were answered. The patient knows to call the clinic with any problems, questions or concerns. No barriers to learning were detected.  The total time spent in the encounter was 40 minutes and more than 50% was on counseling and review of test results   Henreitta Leber, MD Medical Director of Neuro-Oncology [facility] at [facility]  07/10/23 11:28 AM

## 2023-07-21 ENCOUNTER — Other Ambulatory Visit: Payer: Self-pay | Admitting: Oncology

## 2023-07-21 NOTE — Telephone Encounter (Signed)
 Component Ref Range & Units 1 d ago  Potassium 3.6 - 5.1 mmol/L 4.5

## 2023-07-22 ENCOUNTER — Encounter: Payer: Self-pay | Admitting: Oncology

## 2023-07-23 ENCOUNTER — Inpatient Hospital Stay (HOSPITAL_BASED_OUTPATIENT_CLINIC_OR_DEPARTMENT_OTHER): Payer: HMO | Admitting: Oncology

## 2023-07-23 ENCOUNTER — Inpatient Hospital Stay: Payer: HMO | Attending: Oncology

## 2023-07-23 ENCOUNTER — Inpatient Hospital Stay: Payer: Medicare Other

## 2023-07-23 ENCOUNTER — Ambulatory Visit: Payer: Medicare Other | Admitting: Oncology

## 2023-07-23 ENCOUNTER — Encounter: Payer: Self-pay | Admitting: Oncology

## 2023-07-23 ENCOUNTER — Ambulatory Visit: Payer: Medicare Other

## 2023-07-23 ENCOUNTER — Other Ambulatory Visit: Payer: Medicare Other

## 2023-07-23 VITALS — BP 147/85 | HR 85 | Temp 98.9°F | Resp 18 | Wt 218.5 lb

## 2023-07-23 DIAGNOSIS — C3491 Malignant neoplasm of unspecified part of right bronchus or lung: Secondary | ICD-10-CM

## 2023-07-23 DIAGNOSIS — Z5112 Encounter for antineoplastic immunotherapy: Secondary | ICD-10-CM

## 2023-07-23 DIAGNOSIS — E032 Hypothyroidism due to medicaments and other exogenous substances: Secondary | ICD-10-CM | POA: Insufficient documentation

## 2023-07-23 DIAGNOSIS — R519 Headache, unspecified: Secondary | ICD-10-CM | POA: Diagnosis not present

## 2023-07-23 DIAGNOSIS — C3411 Malignant neoplasm of upper lobe, right bronchus or lung: Secondary | ICD-10-CM | POA: Diagnosis not present

## 2023-07-23 DIAGNOSIS — C7931 Secondary malignant neoplasm of brain: Secondary | ICD-10-CM | POA: Insufficient documentation

## 2023-07-23 LAB — CBC WITH DIFFERENTIAL/PLATELET
Abs Immature Granulocytes: 0.04 10*3/uL (ref 0.00–0.07)
Basophils Absolute: 0.1 10*3/uL (ref 0.0–0.1)
Basophils Relative: 1 %
Eosinophils Absolute: 0.3 10*3/uL (ref 0.0–0.5)
Eosinophils Relative: 3 %
HCT: 42.7 % (ref 39.0–52.0)
Hemoglobin: 13.7 g/dL (ref 13.0–17.0)
Immature Granulocytes: 1 %
Lymphocytes Relative: 17 %
Lymphs Abs: 1.3 10*3/uL (ref 0.7–4.0)
MCH: 26.9 pg (ref 26.0–34.0)
MCHC: 32.1 g/dL (ref 30.0–36.0)
MCV: 83.7 fL (ref 80.0–100.0)
Monocytes Absolute: 1 10*3/uL (ref 0.1–1.0)
Monocytes Relative: 13 %
Neutro Abs: 5.1 10*3/uL (ref 1.7–7.7)
Neutrophils Relative %: 65 %
Platelets: 209 10*3/uL (ref 150–400)
RBC: 5.1 MIL/uL (ref 4.22–5.81)
RDW: 16 % — ABNORMAL HIGH (ref 11.5–15.5)
WBC: 7.8 10*3/uL (ref 4.0–10.5)
nRBC: 0 % (ref 0.0–0.2)

## 2023-07-23 LAB — COMPREHENSIVE METABOLIC PANEL
ALT: 28 U/L (ref 0–44)
AST: 21 U/L (ref 15–41)
Albumin: 3.8 g/dL (ref 3.5–5.0)
Alkaline Phosphatase: 77 U/L (ref 38–126)
Anion gap: 11 (ref 5–15)
BUN: 28 mg/dL — ABNORMAL HIGH (ref 8–23)
CO2: 26 mmol/L (ref 22–32)
Calcium: 9.4 mg/dL (ref 8.9–10.3)
Chloride: 104 mmol/L (ref 98–111)
Creatinine, Ser: 1.29 mg/dL — ABNORMAL HIGH (ref 0.61–1.24)
GFR, Estimated: >60 mL/min (ref >60)
Glucose, Bld: 102 mg/dL — ABNORMAL HIGH (ref 70–99)
Potassium: 3.8 mmol/L (ref 3.5–5.1)
Sodium: 141 mmol/L (ref 135–145)
Total Bilirubin: 0.3 mg/dL (ref 0.0–1.2)
Total Protein: 7.2 g/dL (ref 6.5–8.1)

## 2023-07-23 LAB — TSH: TSH: 14.068 u[IU]/mL — ABNORMAL HIGH (ref 0.350–4.500)

## 2023-07-23 MED ORDER — SODIUM CHLORIDE 0.9 % IV SOLN
10.0000 mg/kg | Freq: Once | INTRAVENOUS | Status: AC
  Administered 2023-07-23: 1000 mg via INTRAVENOUS
  Filled 2023-07-23: qty 20

## 2023-07-23 MED ORDER — SODIUM CHLORIDE 0.9 % IV SOLN
Freq: Once | INTRAVENOUS | Status: AC
  Filled 2023-07-23: qty 250

## 2023-07-23 MED ORDER — SODIUM CHLORIDE 0.9% FLUSH
10.0000 mL | INTRAVENOUS | Status: DC | PRN
  Administered 2023-07-23: 10 mL
  Filled 2023-07-23: qty 10

## 2023-07-23 MED ORDER — HEPARIN SOD (PORK) LOCK FLUSH 100 UNIT/ML IV SOLN
500.0000 [IU] | Freq: Once | INTRAVENOUS | Status: AC | PRN
  Administered 2023-07-23: 500 [IU]
  Filled 2023-07-23: qty 5

## 2023-07-23 NOTE — Assessment & Plan Note (Signed)
 Status post radiation,  02/19/22 MRI showed decreased brain lesion size and edema.  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 Dewey.  Follow up with [name]. -repeat MRI brain showed increased edema, favor radiation necrosis. [name] recommend a course of steroid and repeat MRI

## 2023-07-23 NOTE — Progress Notes (Signed)
 Pt here for follow up.

## 2023-07-23 NOTE — Assessment & Plan Note (Signed)
 continue levothyroxine 125 mcg daily

## 2023-07-23 NOTE — Assessment & Plan Note (Signed)
 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  May 2024 CT scan showed Improving bilateral scattered interstitial and ground-glass opacities. Discussed with pulmonology Dr. Jayme Cloud. 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  Labs are reviewed and discussed with patient. Proceed with  Durvalumab.  Repeat CT showed- stable disease

## 2023-07-23 NOTE — Progress Notes (Signed)
 Hematology/Oncology Progress note [phone] [phone]    CHIEF COMPLAINTS/REASON FOR VISIT:  Follow up for lung cancer   ASSESSMENT & PLAN:   Cancer Staging  Squamous cell carcinoma of right lung (HCC) Staging form: Lung, AJCC 8th Edition - Clinical stage from 09/20/2020: Stage IV (cT1b, cN2, cM1) - Signed by [name], MD on 12/13/2021   Squamous cell carcinoma of right lung (HCC) Durvalumab  was previously held due to pneumonitis.  May 2024 CT scan showed Improving bilateral scattered interstitial and ground-glass opacities. Discussed with pulmonology Dr. Tamea. 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  Labs are reviewed and discussed with patient. Proceed with  Durvalumab .  Repeat CT showed- stable disease  Encounter for antineoplastic immunotherapy Durvalumab  treatment as planned.   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 Dewey.  Follow up with [name]. -repeat MRI brain showed increased edema, favor radiation necrosis. [name] recommend a course of steroid and repeat MRI     Follow up 3 weeks lab MD Durvalumab  All questions were answered. The patient knows to call the clinic with any problems, questions or concerns.  Call Babara, MD, PhD [facility] Hematology Oncology 07/23/2023       HISTORY OF PRESENTING ILLNESS:  Joshua [name] is a 68 y.o. male who has  above history reviewed by me today presents for follow up visit for Stage IV lung squamous cell carcinoma. Oncology History  Squamous cell carcinoma of right lung (HCC)  09/18/2020 Imaging   PET scan showed right upper lobe nodule maximal SUV 11.5.  Hypermetabolic cluster right paratracheal adenopathy with maximum SUV up to 12.  No findings of metastatic disease to the neck/abdomen/pelvis or skeleton.   09/20/2020 Initial Diagnosis   Squamous cell carcinoma of right lung (HCC) NGS showed PD-L1 TPS 95%, LRP1B S997fs, PIK3CA E545K, RB1 c1422-2A>T, RIT1 T83_ A84del, STK11 P281fs, TPS53 R248W, TMB 19.12mut/mb, MS stable.    09/20/2020 Cancer Staging   Staging form: Lung, AJCC 8th Edition - Clinical stage from 09/20/2020: Stage IV (cT1b, cN2, cM1) - Signed by [name], MD on 12/13/2021   10/03/2020 Imaging   MRI brain with and without contrast showed no evidence of intracranial metastatic disease. Well-defined round lesion within the left parotid tail measuring approximately 2.3 x 1.8 x 1.8 cm   10/09/2020 - 11/27/2020 Radiation Therapy   concurrent chemoradiation   10/10/2020 - 10/31/2020 Chemotherapy   10/10/2020 concurrent chemoradiation.  carboplatin  AUC of 2 and Taxol  45 mg/m2 x 4.  Additional chemotherapy was held due to thrombocytopenia, neutropenia       12/31/2020 - 03/21/2022 Chemotherapy   Maintenance  Durvalumab  q14d      12/31/2020 -  Chemotherapy   Patient is on Treatment Plan : LUNG Durvalumab  (10) q14d     03/28/2021 Imaging   CT without contrast showed new linear right perihilar consolidation with associated traction bronchiectasis.  Likely postradiation changes.  Additional new peribronchial vascular/nodular groundglass opacities with areas of consolidation or seen primarily in the  right upper and lower lobes, more distant from expected radiation fields. 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  Per my discussion with pulmonology Dr. 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     05/09/2021 Imaging   Repeat CT chest without contrast showed evolving radiation changes in the right perihilar region likely accounting for interval increase to thickening of the posterior wall of the right upper lobe bronchus.  The more peripheral right lung groundglass opacities seen on most recent study have generally improved with exception of one peripherally right upper lobe which appears new.  These are likely inflammatory.  No findings highly suspicious for local recurrence.   05/30/2021 - 01/10/2022 Chemotherapy   Durvalumab  q14d     10/07/2021 Imaging   MRI brain with and without contrast showed 2 contrast-enhancing lesions, most consistent with metastatic disease.  Largest lesion is in the left frontal lobe and measures up to 1.3 cm with a large amount of surrounding edema and 9 mm rightward midline shift.  The other enhancing lesion was 4 mm in the right occipital lobe with a small amount of surrounding edema.  No acute or chronic hemorrhage.   10/07/2021 Progression   Headache after colonoscopy, progressive difficulty using her right arm.  Progressively worsening of speech. Stage IV lung cancer with brain metastasis.  started on Dexamethasone .  He was seen by neurosurgery and radiation oncology.  Recommendation is to proceed with SRS.  Patient was discharged with dexamethasone    10/16/2021 Imaging   PET scan showed postradiation changes in the right supra hilar lung without evidence of local recurrence.  No evidence of lung cancer recurrence or metastatic disease.  Single focus of intense metabolic activity associated with posterior left 11th rib likely secondary to recent fall/posttraumatic metabolic changes.  Frontal  uptake in the left frontal lobe corresponding to the known metastatic lesion in the brain   10/29/2021 -  Radiation Therapy   Brain Radiation  [facility]   12/10/2021 Imaging   Brain MRI: Continued interval decrease in size of a left frontal lobe metastasis, now measuring 10 mm. Moderate surrounding edema has also decreased.  3-4 mm metastasis within the right occipital lobe, not significantly changed in size. There is now a small focus of central non enhancement within this lesion. Increased edema. 3 mm enhancing metastasis within the high left parietal lobe. In retrospect, this was likely present as a subtle punctate focus of enhancement on the prior brain MRI of 10/22/2021.   01/18/2022 Imaging   CT chest abdomen pelvis w contrast 1. Stable post treatment changes in the right upper lobe medially with dense radiation fibrosis but no findings to suggest local recurrent tumor. 2. Significant progression of ill-defined ground-glass opacity, interstitial thickening and airspace nodularity in the right upper lobe and upper aspect of the right lower lobe. Findings could reflect an inflammatory or atypical infectious process, drug induced pneumonitis or interstitial spread of tumor.3. No mediastinal or hilar mass or adenopathy. 4. No findings for abdominal/pelvic metastatic disease or osseous metastatic disease.   02/19/2022 Imaging   MRI brain w wo contrast showed Three metastatic deposits in the brain appear smaller. Decreased edema in the left parietal lobe. No new lesions    04/04/2022 Imaging   CT chest w contrast 1. Stable radiation changes in the right perihilar region without evidence of local recurrence. 2. Interval improvement in the patchy ground-glass opacities previously demonstrated  in both lungs, most consistent with a resolving infectious or inflammatory process (including immunotherapy-related pneumonitis). 3. No evidence of metastatic disease.   05/14/2022 Miscellaneous   He had a mechanical fall and fractured his left elbow. xrays showed a left elbow fracture  05/26/22 seen by orthopedic surgeon, cast applied.    05/27/2022  Imaging   MRI brain w wo contrast 1. All three known brain metastases have enlarged since August, with increasing regional edema. And there is a small new left anterior cerebellar 4-5 mm metastasis (was questionably punctate on the May MRI). This constellation could reflect a combination of True Progression (Left Cerebellar lesion) and pseudoprogression/radionecrosis (3 existing mets).    2. No significant intracranial mass effect. 3. Underlying moderate cerebral white matter signal changes.   05/30/2022 Imaging   CT chest abdomen pelvis w contrast  1. 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 There is a stable small right pleural effusion, but no definitive findings to suggest residual or recurrent disease on today's examination. 2. No signs of metastatic disease in the abdomen or pelvis.  3. Aortic atherosclerosis, in addition to two-vessel coronary artery disease. Please note that although the presence of coronary artery calcium  documents the presence of coronary artery disease, the severity of this disease and any potential stenosis cannot be assessed on this non-gated CT examination. Assessment for potential risk factor modification, dietary therapy or pharmacologic therapy may be warranted, if clinically indicated. 4. Hepatic steatosis. 5. Colonic diverticulosis without evidence of acute diverticulitis at this time. 6. Incompletely imaged but apparently occluded vascular graft in the superior aspect of the right anterior thigh. Referral to vascular specialist for further clinical evaluation should be considered if clinically appropriate. 7. Additional incidental findings, as above.   07/22/2022 Imaging   MRI brain  1. No new or enlarging lesions. No intracranial disease progression. 2. Decreased size of treated lesions of the left frontoparietal junction and left  postcentral gyrus. 3. Unchanged lesions in the left cerebellum and right occipital lobe.   10/15/2022 Imaging   MRI brain w wo contrast showed Interval increase in size of multiple intraparenchymal lesions with worsened surrounding vasogenic edema. 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 No new lesions   11/05/2022 Imaging   MRI brain w wo contrast  1. Likely slight increase and right occipital and left cerebellar lesions. Otherwise, unchanged metastases, as above. No new lesions identified. 2. Partially imaged large probable lymph node in the left upper neck, apparent on prior PET CT.   11/13/2022 -  Radiation Therapy   Each of the sites were treated with SRS/SBRT/SRT-IMRT technique to 20 Gy in 1 fraction to each of the following sites: PTV_2_LCerebel_88mm PTV_3_RCerebel_32mm PTV_4_ROccip_14mm PTV_5_LParietal_27mm   11/27/2022 Imaging   CT chest abdomen pelvis w contrast showed  Improving bilateral scattered interstitial and ground-glass opacities. Small amount of residual areas in the extreme left lower lobe inferiorly. This specific area is slightly increased from previous but overall the amount of opacities is significantly improved. Recommend continued follow-up. No developing new mass lesion, fluid collection or lymph node enlargement. Stable posttreatment changes right perihilar in the lung. Fatty liver infiltration. Colonic diverticulosis. Improved tiny right pleural effusion   02/05/2023 Imaging   MRI brain with and without contrast Interval decrease in all of the metastatic lesions, detailed above.No new lesions identified.    Imaging   CT chest abdomen pelvis w  contrast showed  1. 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 2. Trace right pleural fluid or thickening, new or minimally increased. 3. Incidental findings, including: Aortic  atherosclerosis (ICD10-I70.0), coronary artery atherosclerosis and emphysema (ICD10-J43.9). Hepatic steatosis.   06/25/2023 Imaging   CT chest abdomen pelvis w contrast showed 1. Unchanged post treatment/post radiation appearance of the right lung, with perihilar and suprahilar paramedian fibrosis and consolidation. 2. No evidence of lymphadenopathy or metastatic disease in the chest, abdomen, or pelvis. 3. Hepatic steatosis. 4. Coronary artery disease.   Aortic Atherosclerosis (ICD10-I70.0) and Emphysema (ICD10-J43.9).    Today he feels well.Intermittent headache, is better. denies any focal deficits. chronic SOB,not worse.  He denies any new complaint.    Review of Systems  Constitutional:  Positive for fatigue. Negative for appetite change, chills, fever and unexpected weight change.  HENT:   Negative for hearing loss and voice change.   Eyes:  Negative for eye problems and icterus.  Respiratory:  Negative for chest tightness, cough and shortness of breath.   Cardiovascular:  Negative for chest pain and leg swelling.  Gastrointestinal:  Negative for abdominal distention and abdominal pain.  Endocrine: Negative for hot flashes.  Genitourinary:  Negative for difficulty urinating, dysuria and frequency.   Musculoskeletal:  Negative for arthralgias.  Skin:  Negative for itching and rash.  Neurological:  Negative for extremity weakness, headaches, light-headedness, numbness and speech difficulty.  Hematological:  Negative for adenopathy. Does not bruise/bleed easily.  Psychiatric/Behavioral:  Negative for confusion.       MEDICAL HISTORY:  Past Medical History:  Diagnosis Date   Allergy    Cancer (HCC)    lung   COPD (chronic obstructive pulmonary disease) (HCC)    Dyspnea    former smoker. 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   Peripheral vascular disease (HCC)    Pre-diabetes    Sleep apnea      SURGICAL HISTORY: Past Surgical History:  Procedure Laterality Date   ANTERIOR CERVICAL DECOMP/DISCECTOMY FUSION N/A 03/14/2020   Procedure: ANTERIOR CERVICAL DECOMPRESSION/DISCECTOMY FUSION 3 LEVELS C4-7;  Surgeon: Clois Fret, MD;  Location: [facility] ORS;  Service: Neurosurgery;  Laterality: N/A;   BACK SURGERY  2011   CENTRAL LINE INSERTION Right 09/10/2016   Procedure: CENTRAL LINE INSERTION;  Surgeon: Jama Cordella MATSU, MD;  Location: [facility] ORS;  Service: Vascular;  Laterality: Right;   CERVICAL FUSION     C4-c7 on Mar 14, 2020   COLONOSCOPY  2013 ?   COLONOSCOPY N/A 09/09/2021   Procedure: COLONOSCOPY;  Surgeon: Onita Elspeth Sharper, DO;  Location: [facility] ENDOSCOPY;  Service: Gastroenterology;  Laterality: N/A;   CORONARY ANGIOPLASTY     ESOPHAGOGASTRODUODENOSCOPY N/A 09/09/2021   Procedure: ESOPHAGOGASTRODUODENOSCOPY (EGD);  Surgeon: Onita Elspeth Sharper, DO;  Location: [facility] ENDOSCOPY;  Service: Gastroenterology;  Laterality: N/A;   FEMORAL-POPLITEAL BYPASS GRAFT Right 09/10/2016   Procedure: BYPASS GRAFT FEMORAL-POPLITEAL ARTERY ( BELOW KNEE );  Surgeon: Jama Cordella MATSU, MD;  Location: [facility] ORS;  Service: Vascular;  Laterality: Right;   FEMORAL-TIBIAL BYPASS GRAFT Right 03/23/2020   Procedure: BYPASS GRAFT RIGHT FEMORAL- DISTAL TIBIAL ARTERY WITH DISTAL FLOW GRAFT;  Surgeon: Jama Cordella MATSU, MD;  Location: [facility] ORS;  Service: Vascular;  Laterality: Right;   LEFT HEART CATH AND CORONARY ANGIOGRAPHY Left 05/16/2021   Procedure: LEFT HEART CATH AND CORONARY ANGIOGRAPHY;  Surgeon: Lawyer Bernardino Cough, MD;  Location: [facility]  INVASIVE CV LAB;  Service: Cardiovascular;  Laterality: Left;   LOWER EXTREMITY ANGIOGRAPHY Right 11/18/2016   Procedure: Lower Extremity Angiography;  Surgeon: Cordella KANDICE Shawl, MD;  Location: [facility] INVASIVE CV LAB;  Service: Cardiovascular;  Laterality: Right;   LOWER EXTREMITY ANGIOGRAPHY Right 12/09/2016   Procedure: Lower Extremity Angiography;  Surgeon:  Shawl Cordella KANDICE, MD;  Location: [facility] INVASIVE CV LAB;  Service: Cardiovascular;  Laterality: Right;   LOWER EXTREMITY ANGIOGRAPHY Right 11/15/2019   Procedure: LOWER EXTREMITY ANGIOGRAPHY;  Surgeon: Shawl Cordella KANDICE, MD;  Location: [facility] INVASIVE CV LAB;  Service: Cardiovascular;  Laterality: Right;   LOWER EXTREMITY ANGIOGRAPHY Right 12/01/2019   Procedure: Lower Extremity Angiography;  Surgeon: Marea Selinda RAMAN, MD;  Location: [facility] INVASIVE CV LAB;  Service: Cardiovascular;  Laterality: Right;   LOWER EXTREMITY ANGIOGRAPHY Right 12/02/2019   Procedure: LOWER EXTREMITY ANGIOGRAPHY;  Surgeon: Shawl Cordella KANDICE, MD;  Location: [facility] INVASIVE CV LAB;  Service: Cardiovascular;  Laterality: Right;   LOWER EXTREMITY ANGIOGRAPHY Right 03/23/2020   Procedure: Lower Extremity Angiography;  Surgeon: Shawl Cordella KANDICE, MD;  Location: [facility] INVASIVE CV LAB;  Service: Cardiovascular;  Laterality: Right;   LOWER EXTREMITY ANGIOGRAPHY Right 09/25/2021   Procedure: Lower Extremity Angiography;  Surgeon: Shawl Cordella KANDICE, MD;  Location: [facility] INVASIVE CV LAB;  Service: Cardiovascular;  Laterality: Right;   LOWER EXTREMITY ANGIOGRAPHY Right 09/26/2021   Procedure: Lower Extremity Angiography;  Surgeon: Marea Selinda RAMAN, MD;  Location: [facility] INVASIVE CV LAB;  Service: Cardiovascular;  Laterality: Right;   LOWER EXTREMITY ANGIOGRAPHY Right 04/08/2022   Procedure: Lower Extremity Angiography;  Surgeon: Shawl Cordella KANDICE, MD;  Location: [facility] INVASIVE CV [facility];  Service: Cardiovascular;  Laterality: Right;   LOWER EXTREMITY INTERVENTION  11/18/2016   Procedure: Lower Extremity Intervention;  Surgeon: Cordella KANDICE Shawl, MD;  Location: [facility] INV[facility] CV LAB;  Service: Cardiovascular;;   PERIPHERAL VASCULAR CATHETERIZATION Right 01/02/2015   Procedure: Lower Extremity Angiography;  Surgeon: Cordella KANDICE Shawl, MD;  Location: [facility] INV[facility] CV [facility];  Service: Cardiovascular;  Laterality: Right;   PERIPHERAL VASCULAR CATHETERIZATION Right  06/18/2015   Procedure: Lower Extremity Angiography;  Surgeon: Selinda RAMAN Marea, MD;  Location: [facility] INV[facility];  Service: Cardiovascular;  Laterality: Right;   PERIPHERAL VASCULAR CATHETERIZATION  06/18/2015   Procedure: Lower Extremity Intervention;  Surgeon: Selinda RAMAN Marea, MD;  Location: [facility] INV[facility];  Service: Cardiovascular;;   PERIPHERAL VASCULAR CATHETERIZATION N/A 10/09/2015   Procedure: Abdominal Aortogram w/Lower Extremity;  Surgeon: Cordella KANDICE Shawl, MD;  Location: [facility] INV[facility];  Service: Cardiovascular;  Laterality: N/A;   PERIPHERAL VASCULAR CATHETERIZATION  10/09/2015   Procedure: Lower Extremity Intervention;  Surgeon: Cordella KANDICE Shawl, MD;  Location: [facility];  Service: Cardiovascular;;   PERIPHERAL VASCULAR CATHETERIZATION Right 10/10/2015   Procedure: Lower Extremity Angiography;  Surgeon: Selinda RAMAN Marea, MD;  Location: [facility];  Service: Cardiovascular;  Laterality: Right;   PERIPHERAL VASCULAR CATHETERIZATION Right 10/10/2015   Procedure: Lower Extremity Intervention;  Surgeon: Selinda RAMAN Marea, MD;  Location: [facility];  Service: Cardiovascular;  Laterality: Right;   PERIPHERAL VASCULAR CATHETERIZATION Right 12/03/2015   Procedure: Lower Extremity Angiography;  Surgeon: Selinda RAMAN Marea, MD;  Location: [facility];  Service: Cardiovascular;  Laterality: Right;   PERIPHERAL VASCULAR CATHETERIZATION Right 12/04/2015   Procedure: Lower Extremity Angiography;  Surgeon: Selinda RAMAN Marea, MD;  Location: [facility];  Service: Cardiovascular;  Laterality: Right;   PERIPHERAL VASCULAR CATHETERIZATION  12/04/2015  Procedure: Lower Extremity Intervention;  Surgeon: Selinda GORMAN Gu, MD;  Location: [facility];  Service: Cardiovascular;;   PERIPHERAL VASCULAR CATHETERIZATION Right 06/30/2016   Procedure: Lower Extremity Angiography;  Surgeon: Selinda GORMAN Gu, MD;  Location: [facility];  Service: Cardiovascular;  Laterality: Right;    PERIPHERAL VASCULAR CATHETERIZATION Right 06/25/2016   Procedure: Lower Extremity Angiography;  Surgeon: Cordella KANDICE Shawl, MD;  Location: [facility];  Service: Cardiovascular;  Laterality: Right;   PERIPHERAL VASCULAR CATHETERIZATION Right 07/01/2016   Procedure: Lower Extremity Angiography;  Surgeon: Cordella KANDICE Shawl, MD;  Location: [facility];  Service: Cardiovascular;  Laterality: Right;   PERIPHERAL VASCULAR CATHETERIZATION  07/01/2016   Procedure: Lower Extremity Intervention;  Surgeon: Cordella KANDICE Shawl, MD;  Location: [facility];  Service: Cardiovascular;;   PERIPHERAL VASCULAR CATHETERIZATION Right 08/01/2016   Procedure: Lower Extremity Angiography;  Surgeon: Cordella KANDICE Shawl, MD;  Location: [facility];  Service: Cardiovascular;  Laterality: Right;   PORTA CATH INSERTION N/A 10/05/2020   Procedure: PORTA CATH INSERTION;  Surgeon: Shawl Cordella KANDICE, MD;  Location: [facility] IN[facility];  Service: Cardiovascular;  Laterality: N/A;   stent placement in right leg Right    VIDEO BRONCHOSCOPY N/A 09/14/2020   Procedure: VIDEO BRONCHOSCOPY WITH FLUORO;  Surgeon: Tamea Dedra CROME, MD;  Location: [facility];  Service: Cardiopulmonary;  Laterality: N/A;   VIDEO BRONCHOSCOPY WITH ENDOBRONCHIAL ULTRASOUND N/A 09/14/2020   Procedure: VIDEO BRONCHOSCOPY WITH ENDOBRONCHIAL ULTRASOUND;  Surgeon: Tamea Dedra CROME, MD;  Location: [facility];  Service: Cardiopulmonary;  Laterality: N/A;    SOCIAL HISTORY: Social History   Socioeconomic History   Marital status: Single    Spouse name: Not on file   Number of children: Not on file   Years of education: Not on file   Highest education level: Not on file  Occupational History   Occupation: unemployed  Tobacco Use   Smoking status: Former    Current packs/day: 0.00    Average packs/day: 2.0 packs/day for 38.0 years (76.0 ttl pk-yrs)    Types: Cigarettes    Start date: 12/19/1976    Quit date: 12/20/2014     Years since quitting: 8.5   Smokeless tobacco: Never   Tobacco comments:    quit smoking in 2017 most smoked 2   Vaping Use   Vaping status: Former  Substance and Sexual Activity   Alcohol  use: Not Currently   Drug use: No   Sexual activity: Yes  Other Topics Concern   Not on file  Social History Narrative   Not on file   Social Drivers of Health   Financial Resource Strain: Patient Declined (05/29/2023)   Overall Financial Resource Strain (CARDIA)    Difficulty of Paying Living Expenses: Patient declined  Food Insecurity: Patient Declined (05/29/2023)   Hunger Vital Sign    Worried About Running Out of Food in the Last Year: Patient declined    Ran Out of Food in the Last Year: Patient declined  Transportation Needs: No Transportation Needs (05/29/2023)   PRAPARE - Administrator, Civil Service (Medical): No    Lack of Transportation (Non-Medical): No  Physical Activity: Insufficiently Active (05/29/2023)   Exercise Vital Sign    Days of Exercise per Week: 2 days    Minutes of Exercise per Session: 10 min  Stress: No Stress Concern Present (05/29/2023)   [employer] of Occupational Health - Occupational Stress Questionnaire    Feeling of  Stress : Not at all  Social Connections: Unknown (05/29/2023)   Social Connection and Isolation Panel [NHANES]    Frequency of Communication with Friends and Family: More than three times a week    Frequency of Social Gatherings with Friends and Family: Three times a week    Attends Religious Services: Patient declined    Active Member of Clubs or Organizations: No    Attends Engineer, Structural: Not on file    Marital Status: Patient declined  Catering Manager Violence: Not on file    FAMILY HISTORY: Family History  Problem Relation Age of Onset   Diabetes Mother    Hypertension Mother    Heart murmur Mother    Leukemia Mother    Throat cancer Maternal Grandmother    Colon cancer Neg Hx    Breast cancer  Neg Hx     ALLERGIES:  has no known allergies.  MEDICATIONS:  Current Outpatient Medications  Medication Sig Dispense Refill   aspirin  EC 81 MG tablet Take 81 mg by mouth daily. Swallow whole.     clopidogrel  (PLAVIX ) 75 MG tablet TAKE 1 TABLET BY MOUTH DAILY 100 tablet 2   [START ON 07/27/2023] dexamethasone  (DECADRON ) 4 MG tablet Take 1 tablet (4 mg total) by mouth daily. 10 tablet 0   levothyroxine  (SYNTHROID ) 125 MCG tablet Take 1 tablet (125 mcg total) by mouth daily before breakfast. 90 tablet 0   metFORMIN  (GLUCOPHAGE -XR) 750 MG 24 hr tablet Take 1 tablet (750 mg total) by mouth daily with breakfast. 90 tablet 3   rosuvastatin  (CRESTOR ) 40 MG tablet Take 1 tablet (40 mg total) by mouth daily. 90 tablet 3   Vitamin D , Ergocalciferol , (DRISDOL ) 1.25 MG (50000 UNIT) CAPS capsule Take 1 capsule (50,000 Units total) by mouth every 7 (seven) days. 13 capsule 3   No current facility-administered medications for this visit.   Facility-Administered Medications Ordered in Other Visits  Medication Dose Route Frequency Provider Last Rate Last Admin   albuterol  (PROVENTIL ) (2.5 MG/3ML) 0.083% nebulizer solution 2.5 mg  2.5 mg Nebulization Once Tamea Dedra CROME, MD       heparin  lock flush 100 unit/mL  500 Units Intracatheter Once PRN [name], MD       sodium chloride  flush (NS) 0.9 % injection 10 mL  10 mL Intracatheter PRN [name], MD   10 mL at 07/23/23 1148     PHYSICAL EXAMINATION: ECOG PERFORMANCE STATUS: 1 - Symptomatic but completely ambulatory Vitals:   07/23/23 0914  BP: (!) 147/85  Pulse: 85  Resp: 18  Temp: 98.9 F (37.2 C)  SpO2: 100%    Filed Weights   07/23/23 0914  Weight: 218 lb 8 oz (99.1 kg)     Physical Exam Constitutional:      General: He is not in acute distress.    Appearance: He is obese.     Comments: Walks with a cane  HENT:     Head: Normocephalic and atraumatic.  Eyes:     General: No scleral icterus. Cardiovascular:     Rate and Rhythm:  Normal rate and regular rhythm.  Pulmonary:     Effort: Pulmonary effort is normal. No respiratory distress.     Breath sounds: Normal breath sounds. No wheezing.  Abdominal:     General: There is no distension.     Palpations: Abdomen is soft.     Tenderness: There is no abdominal tenderness.  Musculoskeletal:     Cervical back: Normal range of  motion and neck supple.  Skin:    General: Skin is warm and dry.     Findings: No erythema.  Neurological:     Mental Status: He is alert and oriented to person, place, and time. Mental status is at baseline.     Cranial Nerves: No cranial nerve deficit.     Motor: No abnormal muscle tone.  Psychiatric:        Mood and Affect: Mood and affect normal.      LABORATORY DATA:  I have reviewed the data as listed    Latest Ref Rng & Units 07/23/2023    8:47 AM 07/03/2023    8:56 AM 06/17/2023   12:57 PM  CBC  WBC 4.0 - 10.5 K/uL 7.8  5.9  4.8   Hemoglobin 13.0 - 17.0 g/dL 86.2  86.6  87.7   Hematocrit 39.0 - 52.0 % 42.7  40.4  37.4   Platelets 150 - 400 K/uL 209  214  195       Latest Ref Rng & Units 07/23/2023    8:47 AM 07/03/2023    8:56 AM 06/17/2023   12:57 PM  CMP  Glucose 70 - 99 mg/dL 897  869  898   BUN 8 - 23 mg/dL 28  17  20    Creatinine 0.61 - 1.24 mg/dL 8.70  8.92  9.00   Sodium 135 - 145 mmol/L 141  137  138   Potassium 3.5 - 5.1 mmol/L 3.8  4.1  3.7   Chloride 98 - 111 mmol/L 104  104  104   CO2 22 - 32 mmol/L 26  24  25    Calcium  8.9 - 10.3 mg/dL 9.4  9.2  8.9   Total Protein 6.5 - 8.1 g/dL 7.2  7.0  6.9   Total Bilirubin 0.0 - 1.2 mg/dL 0.3  0.4  0.7   Alkaline Phos 38 - 126 U/L 77  83  77   AST 15 - 41 U/L 21  22  23    ALT 0 - 44 U/L 28  20  18       RADIOGRAPHIC STUDIES: I have personally reviewed the radiological images as listed and agreed with the findings in the report. VAS US  ABI WITH/WO TBI Result Date: 07/09/2023  LOWER EXTREMITY DOPPLER STUDY Patient Name:  MARIN MILLEY  Date of Exam:    07/09/2023 Medical Rec #: [id number]            Accession #:    [id number] Date of Birth: [birth year]           Patient Gender: M Patient Age:   22 years Exam Location:  [facility] Procedure:      VAS US  ABI WITH/WO TBI Referring Phys: [facility] --------------------------------------------------------------------------------  Indications: Peripheral artery disease, and Routine f/u. Patient reports no              claudication or lower extremity pain. High Risk Factors: Hyperlipidemia, past history of smoking. Other Factors: ASO s/p multiple interventions and bypass.                11/15/2019 Right thrombectomy of Fempop insitu graft with added                stent throughout.                 12/01/2019: Aortogram and Selective Right Lower Extremity                Angiogram.  6 mg of TPA delivered in a lysis catheterfrom the                Origin of the Right Femoral -Popliteal bypass down the proximal                ATA with placement of the lysis.                 12/02/2019: PTA of the Right Posterior Tibial Artery. PTA of the                Right Peroneal Artery. Mechanical Thrombectomy of the Right                Femoral -Popliteal Bypass graft. Mechanical Thrombectomy of the                Right Tibioperoneal Trunk and Posterior Tibial Artery.                 09/25/2021: Right Lower Extremity Angiography third order                catheter placement. PTA of the proximal peroneal and                tibioperoneal trunk to 2.5 mm with an ultra score balloon. PTA of                the Femoral Artery and the proximal anastomosis of the bypass                graft to 4 mm with an ultra score balloon. Administration of 5 mg                of tpa throughout the entire bypass and in the peroneal Artery.                Initiation of continuous tpa infusion Right lower Extremity for                limb salvage. Insertion of a triple lumen catheter Left CFV with                US  and fluoroscopic guidance.                  09/26/2021: Right LE Angiogram. PTA of the Right Posterior Tibial                Artery with 2.5 mm diameter by 30 cm length angioplasty balloon.                PTA of the distal bypass anastomosis and distal bypass graft                analogous to the Popliteal artery with 4 mm diameter by 8 cm                length Lutonix drug coated angioplasty balloon.                04/08/2022 right thrombectomy of the Deep profunda.  Vascular Interventions: Hx of multiple right lower extremity PTA's/stents.                         09/10/2016 Right femoral to below the knee popliteal                         artery bypass graft placement. 5/01 Right bypass graft &  mid popliteal artery PTA & coil embolectomy of large                         branch of bypass graft. 12/09/16 Right distal bypass                         graft PTA/stent and right posterior tibial artery PTA.                         03/23/2020 Rt PTA thrombectomy. Rt Profunda fem artery to                         PTA bypass. Performing Technologist: Donnice Charnley RVT  Examination Guidelines: A complete evaluation includes at minimum, Doppler waveform signals and systolic blood pressure reading at the level of bilateral brachial, anterior tibial, and posterior tibial arteries, when vessel segments are accessible. Bilateral testing is considered an integral part of a complete examination. Photoelectric Plethysmograph (PPG) waveforms and toe systolic pressure readings are included as required and additional duplex testing as needed. Limited examinations for reoccurring indications may be performed as noted.  ABI Findings: +---------+------------------+-----+----------+--------+ Right    Rt Pressure (mmHg)IndexWaveform  Comment  +---------+------------------+-----+----------+--------+ Brachial 134                                       +---------+------------------+-----+----------+--------+ PTA      59                0.44  monophasic         +---------+------------------+-----+----------+--------+ DP       47                0.35 monophasic         +---------+------------------+-----+----------+--------+ Great Toe32                0.24                    +---------+------------------+-----+----------+--------+ +---------+------------------+-----+---------+-------+ Left     Lt Pressure (mmHg)IndexWaveform Comment +---------+------------------+-----+---------+-------+ Brachial 131                                     +---------+------------------+-----+---------+-------+ PTA      151               1.13 triphasic        +---------+------------------+-----+---------+-------+ DP       148               1.10 triphasic        +---------+------------------+-----+---------+-------+ Great Toe82                0.61                  +---------+------------------+-----+---------+-------+ +-------+-----------+-----------+------------+------------+ ABI/TBIToday's ABIToday's TBIPrevious ABIPrevious TBI +-------+-----------+-----------+------------+------------+ Right  0.44       0.24       0.45        0.43         +-------+-----------+-----------+------------+------------+ Left   1.13       0.61       1.03        1.02         +-------+-----------+-----------+------------+------------+  Bilateral  ABIs appear essentially unchanged compared to prior study on 01/05/2023.  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 The right toe-brachial index is abnormal. Left: Resting left ankle-brachial index is within normal range. The left toe-brachial index is abnormal. *See table(s) above for measurements and observations.  Electronically signed by [name] MD on 07/09/2023 at 4:17:26 PM.    Final    MR BRAIN W WO CONTRAST Result Date: 07/03/2023 CLINICAL DATA:  Brain/CNS neoplasm, assess treatment response. Metastatic lung  cancer, restaging. EXAM: MRI HEAD WITHOUT AND WITH CONTRAST TECHNIQUE: Multiplanar, multiecho pulse sequences of the brain and surrounding structures were obtained without and with intravenous contrast. CONTRAST:  9mL GADAVIST  GADOBUTROL  1 MMOL/ML IV SOLN COMPARISON:  MRI brain 04/16/2023. FINDINGS: Brain: Continued increase in size of the peripherally enhancing mass centered within the medial aspect of the left postcentral gyrus, now measuring up to 22 mm (coronal image 14 series 17), previously 17 mm. Associated increase in surrounding vasogenic edema. Overall unchanged size but decreased central necrotic component of the 17 mm lesion in the lateral aspect of the left precentral gyrus (coronal image 24 series 17) with associated increase in surrounding vasogenic edema. Unchanged peripherally enhancing lesion in the right occipital lobe (axial image 65 series 16). Slightly decreased punctate enhancing lesion in anterior aspect of the left cerebellar hemisphere (axial image 44 series 16). No acute infarct or hemorrhage. No hydrocephalus or extra-axial collection. No midline shift. Vascular: Normal flow voids and vessel enhancement. Skull and upper cervical spine: Normal marrow signal and enhancement. Sinuses/Orbits: No acute findings. Other: None. IMPRESSION: 1. Continued increase in size of the peripherally enhancing mass centered within the medial aspect of the left postcentral gyrus, now measuring up to 22 mm, previously 17 mm. Associated increase in surrounding vasogenic edema. 2. Overall unchanged size but decreased central necrotic component of the 17 mm lesion in the lateral aspect of the left precentral gyrus with associated increase in surrounding vasogenic edema. 3. Unchanged peripherally enhancing lesion in the right occipital lobe. Slightly decreased punctate enhancing lesion in the anterior aspect of the left cerebellar hemisphere. Electronically Signed   By: Ryan Chess M.D.   On: 07/03/2023 13:47    CT CHEST ABDOMEN PELVIS W CONTRAST Result Date: 06/29/2023 CLINICAL DATA:  Lung cancer restaging * Tracking Code: BO * EXAM: CT CHEST, ABDOMEN, AND PELVIS WITH CONTRAST TECHNIQUE: Multidetector CT imaging of the chest, abdomen and pelvis was performed following the standard protocol during bolus administration of intravenous contrast. RADIATION DOSE REDUCTION: This exam was performed according to the departmental dose-optimization program which includes automated exposure control, adjustment of the mA and/or kV according to patient size and/or use of iterative reconstruction technique. CONTRAST:  OMNIPAQUE  IOHEXOL  300 MG/ML  SOLN COMPARISON:  03/12/2023 FINDINGS: CT CHEST FINDINGS Cardiovascular: Right chest port catheter. Normal heart size. Left and right coronary artery calcifications. No pericardial effusion. Mediastinum/Nodes: No enlarged mediastinal, hilar, or axillary lymph nodes. Thyroid  gland, trachea, and esophagus demonstrate no significant findings. Lungs/Pleura: Minimal paraseptal emphysema. Unchanged post treatment/post radiation appearance of the right lung, with perihilar and suprahilar paramedian fibrosis and consolidation (series 4, image 59). No pleural effusion or pneumothorax. Musculoskeletal: No chest wall abnormality. No acute osseous findings. CT ABDOMEN PELVIS FINDINGS Hepatobiliary: No solid liver abnormality is seen. Hepatic steatosis. No gallstones, gallbladder wall thickening, or biliary dilatation. Pancreas: Unremarkable. No pancreatic ductal dilatation or surrounding inflammatory changes. Spleen: Normal in size without significant abnormality. Adrenals/Urinary Tract: Adrenal glands are unremarkable. Kidneys  are normal, without renal calculi, solid lesion, or hydronephrosis. Bladder is unremarkable. Stomach/Bowel: Stomach is within normal limits. Appendix appears normal. No evidence of bowel wall thickening, distention, or inflammatory changes. Sigmoid diverticulosis.  Vascular/Lymphatic: Aortic atherosclerosis. No enlarged abdominal or pelvic lymph nodes. Reproductive: No mass or other abnormality. Other: No abdominal wall hernia or abnormality. No ascites. Musculoskeletal: No acute osseous findings. IMPRESSION: 1. Unchanged post treatment/post radiation appearance of the right lung, with perihilar and suprahilar paramedian fibrosis and consolidation. 2. No evidence of lymphadenopathy or metastatic disease in the chest, abdomen, or pelvis. 3. Hepatic steatosis. 4. Coronary artery disease. Aortic Atherosclerosis (ICD10-I70.0) and Emphysema (ICD10-J43.9). Electronically Signed   By: Marolyn JONETTA Jaksch M.D.   On: 06/29/2023 07:39

## 2023-07-23 NOTE — Assessment & Plan Note (Signed)
Durvalumab treatment as planned.  

## 2023-07-24 LAB — T4: T4, Total: 8.9 ug/dL (ref 4.5–12.0)

## 2023-07-27 DIAGNOSIS — E782 Mixed hyperlipidemia: Secondary | ICD-10-CM | POA: Diagnosis not present

## 2023-07-27 DIAGNOSIS — I739 Peripheral vascular disease, unspecified: Secondary | ICD-10-CM | POA: Diagnosis not present

## 2023-07-27 DIAGNOSIS — J449 Chronic obstructive pulmonary disease, unspecified: Secondary | ICD-10-CM | POA: Diagnosis not present

## 2023-07-27 DIAGNOSIS — R7303 Prediabetes: Secondary | ICD-10-CM | POA: Diagnosis not present

## 2023-07-27 DIAGNOSIS — C3491 Malignant neoplasm of unspecified part of right bronchus or lung: Secondary | ICD-10-CM | POA: Diagnosis not present

## 2023-07-27 DIAGNOSIS — I10 Essential (primary) hypertension: Secondary | ICD-10-CM | POA: Diagnosis not present

## 2023-07-27 DIAGNOSIS — G959 Disease of spinal cord, unspecified: Secondary | ICD-10-CM | POA: Diagnosis not present

## 2023-07-27 DIAGNOSIS — I70721 Atherosclerosis of other type of bypass graft(s) of the extremities with rest pain, right leg: Secondary | ICD-10-CM | POA: Diagnosis not present

## 2023-07-27 DIAGNOSIS — C7931 Secondary malignant neoplasm of brain: Secondary | ICD-10-CM | POA: Diagnosis not present

## 2023-07-27 DIAGNOSIS — E039 Hypothyroidism, unspecified: Secondary | ICD-10-CM | POA: Diagnosis not present

## 2023-08-02 ENCOUNTER — Other Ambulatory Visit: Payer: Self-pay

## 2023-08-05 ENCOUNTER — Other Ambulatory Visit: Payer: Self-pay | Admitting: Oncology

## 2023-08-05 ENCOUNTER — Ambulatory Visit
Admission: RE | Admit: 2023-08-05 | Discharge: 2023-08-05 | Disposition: A | Discharge status: Home / Self Care | Payer: HMO | Source: Ambulatory Visit | Attending: Internal Medicine | Admitting: Internal Medicine

## 2023-08-05 DIAGNOSIS — G939 Disorder of brain, unspecified: Secondary | ICD-10-CM | POA: Diagnosis not present

## 2023-08-05 DIAGNOSIS — C7931 Secondary malignant neoplasm of brain: Secondary | ICD-10-CM | POA: Diagnosis not present

## 2023-08-05 DIAGNOSIS — C3490 Malignant neoplasm of unspecified part of unspecified bronchus or lung: Secondary | ICD-10-CM | POA: Diagnosis not present

## 2023-08-05 MED ORDER — LEVOTHYROXINE SODIUM 125 MCG PO TABS: 125.0000 ug | ORAL_TABLET | Freq: Every day | ORAL | 0 refills | Status: DC

## 2023-08-05 MED ORDER — GADOBUTROL 1 MMOL/ML IV SOLN
9.0000 mL | Freq: Once | INTRAVENOUS | Status: AC | PRN
  Administered 2023-08-05: 9 mL via INTRAVENOUS

## 2023-08-05 NOTE — Telephone Encounter (Signed)
 TSH Order: [id number]  Status: Final result     Next appt: Today at 01:00 PM in Radiology ([facility])     Dx: Squamous cell carcinoma of right lung...   Test Result Released: Yes (seen)   0 Result Notes          Component Ref Range & Units (hover) 13 d ago (07/23/23) 1 mo ago (07/03/23) 1 mo ago (06/17/23) 2 mo ago (06/02/23) 3 mo ago (04/16/23) 4 mo ago (04/02/23) 4 mo ago (03/19/23)  TSH 14.068 High  11.547 High  CM 16.446 High  CM 3.400 R 3.378 CM 17.134 High  CM 11.143 High  CM  Comment: Performed by a 3rd Generation assay with a functional sensitivity of <=0.01 uIU/mL. Performed at [facility], [address] [place], [place] [address]  Resulting Agency [facility]        Specimen Collected: 07/23/23 08:47 Last Resulted: 07/23/23 11:21      Lab Flowsheet      Order Details      View Encounter      Lab and Collection Details      Routing      Result History    View All Conversations on this Encounter    CM=Additional comments  R=Reference range differs from most recent result in table    Result Care Coordination   Patient Communication   Add Comments   Seen Back to Top   Other Results from 07/23/2023  T4 Order: [id number]  Status: Final result     Next appt: Today at 01:00 PM in Radiology ([facility])     Dx: Squamous cell carcinoma of right lung...     Test Result Released: Yes (seen)   0 Result Notes          Component Ref Range & Units (hover) 13 d ago (07/23/23) 1 mo ago (07/03/23) 1 mo ago (06/17/23) 3 mo ago (04/16/23) 4 mo ago (04/02/23) 4 mo ago (03/19/23) 5 mo ago (03/05/23)  T4, Total 8.9 11.3 CM 9.9 CM 10.5 CM 10.3 CM 10.6 CM 9.7 CM  Comment: (NOTE) Performed At: [facility] [address] [place], [place] [id number] Pearlean Botts MD GE:[id number]  Resulting Agency [facility]         Specimen Collected: 07/23/23 08:47 Last Resulted: 07/24/23 20:35

## 2023-08-06 ENCOUNTER — Other Ambulatory Visit: Payer: Medicare Other

## 2023-08-06 ENCOUNTER — Ambulatory Visit: Payer: Medicare Other | Admitting: Oncology

## 2023-08-06 ENCOUNTER — Ambulatory Visit: Payer: Medicare Other

## 2023-08-10 ENCOUNTER — Ambulatory Visit: Payer: HMO

## 2023-08-13 ENCOUNTER — Inpatient Hospital Stay: Payer: HMO

## 2023-08-13 ENCOUNTER — Inpatient Hospital Stay: Payer: HMO | Admitting: Oncology

## 2023-08-13 ENCOUNTER — Encounter: Payer: Self-pay | Admitting: *Deleted

## 2023-08-13 ENCOUNTER — Encounter: Payer: Self-pay | Admitting: Oncology

## 2023-08-13 VITALS — BP 150/86 | HR 91 | Temp 98.4°F | Ht 71.0 in | Wt 219.5 lb

## 2023-08-13 VITALS — BP 137/66 | HR 83

## 2023-08-13 DIAGNOSIS — C3491 Malignant neoplasm of unspecified part of right bronchus or lung: Secondary | ICD-10-CM

## 2023-08-13 DIAGNOSIS — Z5112 Encounter for antineoplastic immunotherapy: Secondary | ICD-10-CM

## 2023-08-13 DIAGNOSIS — C7931 Secondary malignant neoplasm of brain: Secondary | ICD-10-CM | POA: Diagnosis not present

## 2023-08-13 DIAGNOSIS — E032 Hypothyroidism due to medicaments and other exogenous substances: Secondary | ICD-10-CM | POA: Diagnosis not present

## 2023-08-13 LAB — CBC WITH DIFFERENTIAL/PLATELET
Abs Immature Granulocytes: 0.02 10*3/uL (ref 0.00–0.07)
Basophils Absolute: 0 10*3/uL (ref 0.0–0.1)
Basophils Relative: 1 %
Eosinophils Absolute: 0.2 10*3/uL (ref 0.0–0.5)
Eosinophils Relative: 3 %
HCT: 41.7 % (ref 39.0–52.0)
Hemoglobin: 13.3 g/dL (ref 13.0–17.0)
Immature Granulocytes: 0 %
Lymphocytes Relative: 21 %
Lymphs Abs: 1 10*3/uL (ref 0.7–4.0)
MCH: 26.8 pg (ref 26.0–34.0)
MCHC: 31.9 g/dL (ref 30.0–36.0)
MCV: 83.9 fL (ref 80.0–100.0)
Monocytes Absolute: 0.6 10*3/uL (ref 0.1–1.0)
Monocytes Relative: 12 %
Neutro Abs: 3 10*3/uL (ref 1.7–7.7)
Neutrophils Relative %: 63 %
Platelets: 215 10*3/uL (ref 150–400)
RBC: 4.97 MIL/uL (ref 4.22–5.81)
RDW: 16.9 % — ABNORMAL HIGH (ref 11.5–15.5)
WBC: 4.8 10*3/uL (ref 4.0–10.5)
nRBC: 0 % (ref 0.0–0.2)

## 2023-08-13 LAB — COMPREHENSIVE METABOLIC PANEL
ALT: 31 U/L (ref 0–44)
AST: 24 U/L (ref 15–41)
Albumin: 3.9 g/dL (ref 3.5–5.0)
Alkaline Phosphatase: 68 U/L (ref 38–126)
Anion gap: 9 (ref 5–15)
BUN: 17 mg/dL (ref 8–23)
CO2: 26 mmol/L (ref 22–32)
Calcium: 9.1 mg/dL (ref 8.9–10.3)
Chloride: 105 mmol/L (ref 98–111)
Creatinine, Ser: 0.98 mg/dL (ref 0.61–1.24)
GFR, Estimated: >60 mL/min (ref >60)
Glucose, Bld: 137 mg/dL — ABNORMAL HIGH (ref 70–99)
Potassium: 3.6 mmol/L (ref 3.5–5.1)
Sodium: 140 mmol/L (ref 135–145)
Total Bilirubin: 0.4 mg/dL (ref 0.0–1.2)
Total Protein: 6.9 g/dL (ref 6.5–8.1)

## 2023-08-13 MED ORDER — HEPARIN SOD (PORK) LOCK FLUSH 100 UNIT/ML IV SOLN
500.0000 [IU] | Freq: Once | INTRAVENOUS | Status: AC | PRN
  Administered 2023-08-13: 500 [IU]
  Filled 2023-08-13: qty 5

## 2023-08-13 MED ORDER — SODIUM CHLORIDE 0.9 % IV SOLN
Freq: Once | INTRAVENOUS | Status: AC
  Filled 2023-08-13: qty 250

## 2023-08-13 MED ORDER — SODIUM CHLORIDE 0.9 % IV SOLN
10.0000 mg/kg | Freq: Once | INTRAVENOUS | Status: AC
  Administered 2023-08-13: 1000 mg via INTRAVENOUS
  Filled 2023-08-13: qty 20

## 2023-08-13 NOTE — Assessment & Plan Note (Signed)
Status post radiation,  02/19/22 MRI showed decreased brain lesion size and edema.  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 Mitzi Hansen.  Follow up with Dr. Barbaraann Cao. -repeat MRI brain showed increased edema, favor radiation necrosis. Dr. Barbaraann Cao recommend a course of steroid and repeat MRI - he will meet Dr. Barbaraann Cao tomorrow for discussion.

## 2023-08-13 NOTE — Patient Instructions (Signed)

## 2023-08-13 NOTE — Assessment & Plan Note (Signed)
Durvalumab treatment as planned.  

## 2023-08-13 NOTE — Progress Notes (Signed)
Hematology/Oncology Progress note [phone] [phone]    CHIEF COMPLAINTS/REASON FOR VISIT:  Follow up for lung cancer   ASSESSMENT & PLAN:   Cancer Staging  Squamous cell carcinoma of right lung [facility]) Staging form: Lung, AJCC 8th Edition - Clinical stage from 09/20/2020: Stage IV (cT1b, cN2, cM1) - Signed by [name], MD on 12/13/2021   Encounter for antineoplastic immunotherapy Durvalumab treatment as planned.   Squamous cell carcinoma of right lung (HCC) Durvalumab was previously held due to pneumonitis.  May 2024 CT scan showed Improving bilateral scattered interstitial and ground-glass opacities. Discussed with pulmonology Dr. Jayme Cloud. 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  Labs are reviewed and discussed with patient. Proceed with  Durvalumab.   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 Mitzi Hansen.  Follow up with Dr. Barbaraann Cao. -repeat MRI brain showed increased edema, favor radiation necrosis. Dr. Barbaraann Cao recommend a course of steroid and repeat MRI - he will meet Dr. Barbaraann Cao tomorrow for discussion.     Follow up 3 weeks lab MD Durvalumab All questions were answered. The patient knows to call the clinic with any problems, questions or concerns.  [name], MD, PhD [facility] Hematology Oncology 08/13/2023       HISTORY OF PRESENTING ILLNESS:  Joshua [name] is a 68 y.o.  male who has above history reviewed by me today presents for follow up visit for Stage IV lung squamous cell carcinoma. Oncology History  Squamous cell carcinoma of right lung (HCC)  09/18/2020 Imaging   PET scan showed right upper lobe nodule maximal SUV 11.5.  Hypermetabolic cluster right paratracheal adenopathy with maximum SUV up to 12.  No findings of metastatic disease to the neck/abdomen/pelvis or skeleton.   09/20/2020 Initial Diagnosis   Squamous cell carcinoma of right lung (HCC) NGS showed PD-L1 TPS 95%, LRP1B S975fs, PIK3CA E545K, RB1 c1422-2A>T, RIT1 T83_ A84del, STK11 P242fs, TPS53 R248W, TMB 19.55mut/mb, MS stable.    09/20/2020 Cancer Staging   Staging form: Lung, AJCC 8th Edition - Clinical stage from 09/20/2020: Stage IV (cT1b, cN2, cM1) - Signed by [name], MD on 12/13/2021   10/03/2020 Imaging   MRI brain with and without contrast showed no evidence of intracranial metastatic disease. Well-defined round lesion within the left parotid tail measuring approximately 2.3 x 1.8 x 1.8 cm   10/09/2020 - 11/27/2020 Radiation Therapy   concurrent chemoradiation   10/10/2020 - 10/31/2020 Chemotherapy   10/10/2020 concurrent chemoradiation.  carboplatin AUC of 2 and Taxol 45 mg/m2 x 4.  Additional chemotherapy was held due to thrombocytopenia, neutropenia       12/31/2020 - 03/21/2022 Chemotherapy   Maintenance  Durvalumab q14d      12/31/2020 -  Chemotherapy   Patient is on Treatment Plan : LUNG Durvalumab (10) q14d     03/28/2021 Imaging   CT without contrast showed new linear right perihilar consolidation with associated traction bronchiectasis.  Likely postradiation changes.  Additional new peribronchial vascular/nodular groundglass opacities with areas of consolidation or  seen primarily in the right upper and lower lobes, more distant from expected radiation fields. 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  Per my discussion with pulmonology Dr. 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     05/09/2021 Imaging   Repeat CT chest without contrast showed evolving radiation changes in the right perihilar region likely accounting for interval increase to thickening of the posterior wall of the right upper lobe bronchus.  The more peripheral right lung groundglass opacities seen on most recent study have generally improved with exception of one peripherally right upper lobe which appears new.  These are likely inflammatory.  No findings highly suspicious for local recurrence.   05/30/2021 - 01/10/2022 Chemotherapy   Durvalumab q14d     10/07/2021 Imaging   MRI brain with and without contrast showed 2 contrast-enhancing lesions, most consistent with metastatic disease.  Largest lesion is in the left frontal lobe and measures up to 1.3 cm with a large amount of surrounding edema and 9 mm rightward midline shift.  The other enhancing lesion was 4 mm in the right occipital lobe with a small amount of surrounding edema.  No acute or chronic hemorrhage.   10/07/2021 Progression   Headache after colonoscopy, progressive difficulty using her right arm.  Progressively worsening of speech. Stage IV lung cancer with brain metastasis.  started on Dexamethasone.  He was seen by neurosurgery and radiation oncology.  Recommendation is to proceed with SRS.  Patient was discharged with dexamethasone   10/16/2021 Imaging   PET scan showed postradiation changes in the right supra hilar lung without evidence of local recurrence.  No evidence of lung cancer recurrence or metastatic disease.  Single focus of intense metabolic activity associated with posterior left 11th rib likely secondary to recent fall/posttraumatic metabolic changes.  Frontal  uptake in the left frontal lobe corresponding to the known metastatic lesion in the brain   10/29/2021 -  Radiation Therapy   Brain  Radiation [facility]   12/10/2021 Imaging   Brain MRI: Continued interval decrease in size of a left frontal lobe metastasis, now measuring 10 mm. Moderate surrounding edema has also decreased.  3-4 mm metastasis within the right occipital lobe, not significantly changed in size. There is now a small focus of central non enhancement within this lesion. Increased edema. 3 mm enhancing metastasis within the high left parietal lobe. In retrospect, this was likely present as a subtle punctate focus of enhancement on the prior brain MRI of 10/22/2021.   01/18/2022 Imaging   CT chest abdomen pelvis w contrast 1. Stable post treatment changes in the right upper lobe medially with dense radiation fibrosis but no findings to suggest local recurrent tumor. 2. Significant progression of ill-defined ground-glass opacity, interstitial thickening and airspace nodularity in the right upper lobe and upper aspect of the right lower lobe. Findings could reflect an inflammatory or atypical infectious process, drug induced pneumonitis or interstitial spread of tumor.3. No mediastinal or hilar mass or adenopathy. 4. No findings for abdominal/pelvic metastatic disease or osseous metastatic disease.   02/19/2022 Imaging   MRI brain w wo contrast showed Three metastatic deposits in the brain appear smaller. Decreased edema in the left parietal lobe. No new lesions    04/04/2022 Imaging   CT chest w contrast 1. Stable radiation changes in the right perihilar region without evidence of local recurrence. 2. Interval improvement in the patchy  ground-glass opacities previously demonstrated in both lungs, most consistent with a resolving infectious or inflammatory process (including immunotherapy-related pneumonitis). 3. No evidence of metastatic disease.   05/14/2022 Miscellaneous   He had a mechanical fall and fractured his left elbow. xrays showed a left elbow fracture  05/26/22 seen by orthopedic surgeon, cast applied.     05/27/2022 Imaging   MRI brain w wo contrast 1. All three known brain metastases have enlarged since August, with increasing regional edema. And there is a small new left anterior cerebellar 4-5 mm metastasis (was questionably punctate on the May MRI). This constellation could reflect a combination of True Progression (Left Cerebellar lesion) and pseudoprogression/radionecrosis (3 existing mets).    2. No significant intracranial mass effect. 3. Underlying moderate cerebral white matter signal changes.   05/30/2022 Imaging   CT chest abdomen pelvis w contrast  1. 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 There is a stable small right pleural effusion, but no definitive findings to suggest residual or recurrent disease on today's examination. 2. No signs of metastatic disease in the abdomen or pelvis.  3. Aortic atherosclerosis, in addition to two-vessel coronary artery disease. Please note that although the presence of coronary artery calcium documents the presence of coronary artery disease, the severity of this disease and any potential stenosis cannot be assessed on this non-gated CT examination. Assessment for potential risk factor modification, dietary therapy or pharmacologic therapy may be warranted, if clinically indicated. 4. Hepatic steatosis. 5. Colonic diverticulosis without evidence of acute diverticulitis at this time. 6. Incompletely imaged but apparently occluded vascular graft in the superior aspect of the right anterior thigh. Referral to vascular specialist for further clinical evaluation should be considered if clinically appropriate. 7. Additional incidental findings, as above.   07/22/2022 Imaging   MRI brain  1. No new or enlarging lesions. No intracranial disease progression. 2. Decreased size of treated lesions of the left frontoparietal junction and  left postcentral gyrus. 3. Unchanged lesions in the left cerebellum and right occipital lobe.   10/15/2022 Imaging   MRI brain w wo contrast showed Interval increase in size of multiple intraparenchymal lesions with worsened surrounding vasogenic edema. 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 No new lesions   11/05/2022 Imaging   MRI brain w wo contrast  1. Likely slight increase and right occipital and left cerebellar lesions. Otherwise, unchanged metastases, as above. No new lesions identified. 2. Partially imaged large probable lymph node in the left upper neck, apparent on prior PET CT.   11/13/2022 -  Radiation Therapy   Each of the sites were treated with SRS/SBRT/SRT-IMRT technique to 20 Gy in 1 fraction to each of the following sites: PTV_2_LCerebel_20mm PTV_3_RCerebel_10mm PTV_4_ROccip_81mm PTV_5_LParietal_58mm   11/27/2022 Imaging   CT chest abdomen pelvis w contrast showed  Improving bilateral scattered interstitial and ground-glass opacities. Small amount of residual areas in the extreme left lower lobe inferiorly. This specific area is slightly increased from previous but overall the amount of opacities is significantly improved. Recommend continued follow-up. No developing new mass lesion, fluid collection or lymph node enlargement. Stable posttreatment changes right perihilar in the lung. Fatty liver infiltration. Colonic diverticulosis. Improved tiny right pleural effusion   02/05/2023 Imaging   MRI brain with and without contrast Interval decrease in all of the metastatic lesions, detailed above.No new lesions identified.    Imaging   CT  chest abdomen pelvis w contrast showed  1. 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 2. Trace right pleural fluid or thickening, new or minimally increased. 3. Incidental findings, including:  Aortic atherosclerosis (ICD10-I70.0), coronary artery atherosclerosis and emphysema (ICD10-J43.9). Hepatic steatosis.   06/25/2023 Imaging   CT chest abdomen pelvis w contrast showed 1. Unchanged post treatment/post radiation appearance of the right lung, with perihilar and suprahilar paramedian fibrosis and consolidation. 2. No evidence of lymphadenopathy or metastatic disease in the chest, abdomen, or pelvis. 3. Hepatic steatosis. 4. Coronary artery disease.   Aortic Atherosclerosis (ICD10-I70.0) and Emphysema (ICD10-J43.9).    Today he feels well.Intermittent headache, is better. denies any focal deficits. chronic SOB,not worse.  He denies any new complaint.    Review of Systems  Constitutional:  Positive for fatigue. Negative for appetite change, chills, fever and unexpected weight change.  HENT:   Negative for hearing loss and voice change.   Eyes:  Negative for eye problems and icterus.  Respiratory:  Negative for chest tightness, cough and shortness of breath.   Cardiovascular:  Negative for chest pain and leg swelling.  Gastrointestinal:  Negative for abdominal distention and abdominal pain.  Endocrine: Negative for hot flashes.  Genitourinary:  Negative for difficulty urinating, dysuria and frequency.   Musculoskeletal:  Negative for arthralgias.  Skin:  Negative for itching and rash.  Neurological:  Negative for extremity weakness, headaches, light-headedness, numbness and speech difficulty.  Hematological:  Negative for adenopathy. Does not bruise/bleed easily.  Psychiatric/Behavioral:  Negative for confusion.       MEDICAL HISTORY:  Past Medical History:  Diagnosis Date   Allergy    Cancer (HCC)    lung   COPD (chronic obstructive pulmonary disease) (HCC)    Dyspnea    former smoker. 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   Peripheral vascular disease (HCC)    Pre-diabetes    Sleep apnea      SURGICAL HISTORY: Past Surgical History:  Procedure Laterality Date   ANTERIOR CERVICAL DECOMP/DISCECTOMY FUSION N/A 03/14/2020   Procedure: ANTERIOR CERVICAL DECOMPRESSION/DISCECTOMY FUSION 3 LEVELS C4-7;  Surgeon: Venetia Night, MD;  Location: [facility] ORS;  Service: Neurosurgery;  Laterality: N/A;   BACK SURGERY  2011   CENTRAL LINE INSERTION Right 09/10/2016   Procedure: CENTRAL LINE INSERTION;  Surgeon: Renford Dills, MD;  Location: [facility] OR[facility];  Service: Vascular;  Laterality: Right;   CERVICAL FUSION     C4-c7 on Mar 14, 2020   COLONOSCOPY  2013 ?   COLONOSCOPY N/A 09/09/2021   Procedure: COLONOSCOPY;  Surgeon: Jaynie Collins, DO;  Location: [facility] ENDOSCOPY;  Service: Gastroenterology;  Laterality: N/A;   CORONARY ANGIOPLASTY     ESOPHAGOGASTRODUODENOSCOPY N/A 09/09/2021   Procedure: ESOPHAGOGASTRODUODENOSCOPY (EGD);  Surgeon: Jaynie Collins, DO;  Location: [facility] ENDOSCOPY;  Service: Gastroenterology;  Laterality: N/A;   FEMORAL-POPLITEAL BYPASS GRAFT Right 09/10/2016   Procedure: BYPASS GRAFT FEMORAL-POPLITEAL ARTERY ( BELOW KNEE );  Surgeon: Renford Dills, MD;  Location: [facility];  Service: Vascular;  Laterality: Right;   FEMORAL-TIBIAL BYPASS GRAFT Right 03/23/2020   Procedure: BYPASS GRAFT RIGHT FEMORAL- DISTAL TIBIAL ARTERY WITH DISTAL FLOW GRAFT;  Surgeon: Renford Dills, MD;  Location: [facility];  Service: Vascular;  Laterality: Right;   LEFT HEART CATH AND CORONARY ANGIOGRAPHY Left 05/16/2021   Procedure: LEFT HEART CATH AND CORONARY ANGIOGRAPHY;  Surgeon: Armando Reichert,  MD;  Location: [facility];  Service: Cardiovascular;  Laterality: Left;   LOWER EXTREMITY ANGIOGRAPHY Right 11/18/2016   Procedure: Lower Extremity Angiography;  Surgeon: Renford Dills, MD;  Location: [facility];  Service: Cardiovascular;  Laterality: Right;   LOWER EXTREMITY ANGIOGRAPHY Right 12/09/2016   Procedure: Lower Extremity Angiography;  Surgeon:  Renford Dills, MD;  Location: [facility];  Service: Cardiovascular;  Laterality: Right;   LOWER EXTREMITY ANGIOGRAPHY Right 11/15/2019   Procedure: LOWER EXTREMITY ANGIOGRAPHY;  Surgeon: Renford Dills, MD;  Location: [facility];  Service: Cardiovascular;  Laterality: Right;   LOWER EXTREMITY ANGIOGRAPHY Right 12/01/2019   Procedure: Lower Extremity Angiography;  Surgeon: Annice Needy, MD;  Location: [facility];  Service: Cardiovascular;  Laterality: Right;   LOWER EXTREMITY ANGIOGRAPHY Right 12/02/2019   Procedure: LOWER EXTREMITY ANGIOGRAPHY;  Surgeon: Renford Dills, MD;  Location: [facility];  Service: Cardiovascular;  Laterality: Right;   LOWER EXTREMITY ANGIOGRAPHY Right 03/23/2020   Procedure: Lower Extremity Angiography;  Surgeon: Renford Dills, MD;  Location: [facility];  Service: Cardiovascular;  Laterality: Right;   LOWER EXTREMITY ANGIOGRAPHY Right 09/25/2021   Procedure: Lower Extremity Angiography;  Surgeon: Renford Dills, MD;  Location: [facility];  Service: Cardiovascular;  Laterality: Right;   LOWER EXTREMITY ANGIOGRAPHY Right 09/26/2021   Procedure: Lower Extremity Angiography;  Surgeon: Annice Needy, MD;  Location: [facility];  Service: Cardiovascular;  Laterality: Right;   LOWER EXTREMITY ANGIOGRAPHY Right 04/08/2022   Procedure: Lower Extremity Angiography;  Surgeon: Renford Dills, MD;  Location: [facility];  Service: Cardiovascular;  Laterality: Right;   LOWER EXTREMITY INTERVENTION  11/18/2016   Procedure: Lower Extremity Intervention;  Surgeon: Renford Dills, MD;  Location: [facility];  Service: Cardiovascular;;   PERIPHERAL VASCULAR CATHETERIZATION Right 01/02/2015   Procedure: Lower Extremity Angiography;  Surgeon: Renford Dills, MD;  Location: [facility];  Service: Cardiovascular;  Laterality: Right;   PERIPHERAL VASCULAR CATHETERIZATION Right  06/18/2015   Procedure: Lower Extremity Angiography;  Surgeon: Annice Needy, MD;  Location: [facility];  Service: Cardiovascular;  Laterality: Right;   PERIPHERAL VASCULAR CATHETERIZATION  06/18/2015   Procedure: Lower Extremity Intervention;  Surgeon: Annice Needy, MD;  Location: [facility];  Service: Cardiovascular;;   PERIPHERAL VASCULAR CATHETERIZATION N/A 10/09/2015   Procedure: Abdominal Aortogram w/Lower Extremity;  Surgeon: Renford Dills, MD;  Location: [facility];  Service: Cardiovascular;  Laterality: N/A;   PERIPHERAL VASCULAR CATHETERIZATION  10/09/2015   Procedure: Lower Extremity Intervention;  Surgeon: Renford Dills, MD;  Location: [facility];  Service: Cardiovascular;;   PERIPHERAL VASCULAR CATHETERIZATION Right 10/10/2015   Procedure: Lower Extremity Angiography;  Surgeon: Annice Needy, MD;  Location: [facility];  Service: Cardiovascular;  Laterality: Right;   PERIPHERAL VASCULAR CATHETERIZATION Right 10/10/2015   Procedure: Lower Extremity Intervention;  Surgeon: Annice Needy, MD;  Location: [facility];  Service: Cardiovascular;  Laterality: Right;   PERIPHERAL VASCULAR CATHETERIZATION Right 12/03/2015   Procedure: Lower Extremity Angiography;  Surgeon: Annice Needy, MD;  Location: [facility];  Service: Cardiovascular;  Laterality: Right;   PERIPHERAL VASCULAR CATHETERIZATION Right 12/04/2015   Procedure: Lower Extremity Angiography;  Surgeon: Annice Needy, MD;  Location: [facility];  Service: Cardiovascular;  Laterality: Right;   PERIPHERAL VASCULAR  CATHETERIZATION  12/04/2015   Procedure: Lower Extremity Intervention;  Surgeon: Annice Needy, MD;  Location: [facility];  Service: Cardiovascular;;   PERIPHERAL VASCULAR CATHETERIZATION Right 06/30/2016   Procedure: Lower Extremity Angiography;  Surgeon: Annice Needy, MD;  Location: [facility];  Service: Cardiovascular;  Laterality: Right;    PERIPHERAL VASCULAR CATHETERIZATION Right 06/25/2016   Procedure: Lower Extremity Angiography;  Surgeon: Renford Dills, MD;  Location: [facility];  Service: Cardiovascular;  Laterality: Right;   PERIPHERAL VASCULAR CATHETERIZATION Right 07/01/2016   Procedure: Lower Extremity Angiography;  Surgeon: Renford Dills, MD;  Location: [facility];  Service: Cardiovascular;  Laterality: Right;   PERIPHERAL VASCULAR CATHETERIZATION  07/01/2016   Procedure: Lower Extremity Intervention;  Surgeon: Renford Dills, MD;  Location: [facility];  Service: Cardiovascular;;   PERIPHERAL VASCULAR CATHETERIZATION Right 08/01/2016   Procedure: Lower Extremity Angiography;  Surgeon: Renford Dills, MD;  Location: [facility];  Service: Cardiovascular;  Laterality: Right;   PORTA CATH INSERTION N/A 10/05/2020   Procedure: PORTA CATH INSERTION;  Surgeon: Renford Dills, MD;  Location: [facility];  Service: Cardiovascular;  Laterality: N/A;   stent placement in right leg Right    VIDEO BRONCHOSCOPY N/A 09/14/2020   Procedure: VIDEO BRONCHOSCOPY WITH FLUORO;  Surgeon: Salena Saner, MD;  Location: [facility];  Service: Cardiopulmonary;  Laterality: N/A;   VIDEO BRONCHOSCOPY WITH ENDOBRONCHIAL ULTRASOUND N/A 09/14/2020   Procedure: VIDEO BRONCHOSCOPY WITH ENDOBRONCHIAL ULTRASOUND;  Surgeon: Salena Saner, MD;  Location: [facility];  Service: Cardiopulmonary;  Laterality: N/A;    SOCIAL HISTORY: Social History   Socioeconomic History   Marital status: Single    Spouse name: Not on file   Number of children: Not on file   Years of education: Not on file   Highest education level: Not on file  Occupational History   Occupation: unemployed  Tobacco Use   Smoking status: Former    Current packs/day: 0.00    Average packs/day: 2.0 packs/day for 38.0 years (76.0 ttl pk-yrs)    Types: Cigarettes    Start date: 12/19/1976    Quit date: 12/20/2014     Years since quitting: 8.6   Smokeless tobacco: Never   Tobacco comments:    quit smoking in 2017 most smoked 2   Vaping Use   Vaping status: Former  Substance and Sexual Activity   Alcohol use: Not Currently   Drug use: No   Sexual activity: Yes  Other Topics Concern   Not on file  Social History Narrative   Not on file   Social Drivers of Health   Financial Resource Strain: Low Risk  (07/27/2023)   Received from [facility]   Overall Financial Resource Strain (CARDIA)    Difficulty of Paying Living Expenses: Not hard at all  Food Insecurity: No Food Insecurity (07/27/2023)   Received from [facility]   Hunger Vital Sign    Worried About Running Out of Food in the Last Year: Never true    Ran Out of Food in the Last Year: Never true  Transportation Needs: No Transportation Needs (07/27/2023)   Received from [facility] - Transportation    In the past 12 months, has lack of transportation kept you from medical appointments or from getting medications?: No    Lack of Transportation (Non-Medical): No  Physical Activity: Insufficiently Active (05/29/2023)  Exercise Vital Sign    Days of Exercise per Week: 2 days    Minutes of Exercise per Session: 10 min  Stress: No Stress Concern Present (05/29/2023)   [employer] of Occupational Health - Occupational Stress Questionnaire    Feeling of Stress : Not at all  Social Connections: Unknown (05/29/2023)   Social Connection and Isolation Panel [NHANES]    Frequency of Communication with Friends and Family: More than three times a week    Frequency of Social Gatherings with Friends and Family: Three times a week    Attends Religious Services: Patient declined    Active Member of Clubs or Organizations: No    Attends Engineer, structural: Not on file    Marital Status: Patient declined  Catering manager Violence: Not on file    FAMILY HISTORY: Family History   Problem Relation Age of Onset   Diabetes Mother    Hypertension Mother    Heart murmur Mother    Leukemia Mother    Throat cancer Maternal Grandmother    Colon cancer Neg Hx    Breast cancer Neg Hx     ALLERGIES:  has no known allergies.  MEDICATIONS:  Current Outpatient Medications  Medication Sig Dispense Refill   aspirin EC 81 MG tablet Take 81 mg by mouth daily. Swallow whole.     clopidogrel (PLAVIX) 75 MG tablet TAKE 1 TABLET BY MOUTH DAILY 100 tablet 2   levothyroxine (SYNTHROID) 125 MCG tablet Take 1 tablet (125 mcg total) by mouth daily before breakfast. 90 tablet 0   rosuvastatin (CRESTOR) 40 MG tablet Take 1 tablet (40 mg total) by mouth daily. 90 tablet 3   Vitamin D, Ergocalciferol, (DRISDOL) 1.25 MG (50000 UNIT) CAPS capsule Take 1 capsule (50,000 Units total) by mouth every 7 (seven) days. 13 capsule 3   No current facility-administered medications for this visit.   Facility-Administered Medications Ordered in Other Visits  Medication Dose Route Frequency Provider Last Rate Last Admin   albuterol (PROVENTIL) (2.5 MG/3ML) 0.083% nebulizer solution 2.5 mg  2.5 mg Nebulization Once Salena Saner, MD       heparin lock flush 100 unit/mL  500 Units Intracatheter Once PRN [name], MD         PHYSICAL EXAMINATION: ECOG PERFORMANCE STATUS: 1 - Symptomatic but completely ambulatory Vitals:   08/13/23 1314  BP: (!) 150/86  Pulse: 91  Temp: 98.4 F (36.9 C)  SpO2: 100%    Filed Weights   08/13/23 1314  Weight: 219 lb 8 oz (99.6 kg)     Physical Exam Constitutional:      General: He is not in acute distress.    Appearance: He is obese.     Comments: Walks with a cane  HENT:     Head: Normocephalic and atraumatic.  Eyes:     General: No scleral icterus. Cardiovascular:     Rate and Rhythm: Normal rate and regular rhythm.  Pulmonary:     Effort: Pulmonary effort is normal. No respiratory distress.     Breath sounds: Normal breath sounds. No  wheezing.  Abdominal:     General: There is no distension.     Palpations: Abdomen is soft.     Tenderness: There is no abdominal tenderness.  Musculoskeletal:     Cervical back: Normal range of motion and neck supple.  Skin:    General: Skin is warm and dry.     Findings: No erythema.  Neurological:  Mental Status: He is alert and oriented to person, place, and time. Mental status is at baseline.     Cranial Nerves: No cranial nerve deficit.     Motor: No abnormal muscle tone.  Psychiatric:        Mood and Affect: Mood and affect normal.      LABORATORY DATA:  I have reviewed the data as listed    Latest Ref Rng & Units 08/13/2023   12:41 PM 07/23/2023    8:47 AM 07/03/2023    8:56 AM  CBC  WBC 4.0 - 10.5 K/uL 4.8  7.8  5.9   Hemoglobin 13.0 - 17.0 g/dL 16.1  09.6  04.5   Hematocrit 39.0 - 52.0 % 41.7  42.7  40.4   Platelets 150 - 400 K/uL 215  209  214       Latest Ref Rng & Units 08/13/2023   12:41 PM 07/23/2023    8:47 AM 07/03/2023    8:56 AM  CMP  Glucose 70 - 99 mg/dL 409  811  914   BUN 8 - 23 mg/dL 17  28  17    Creatinine 0.61 - 1.24 mg/dL 7.82  9.56  2.13   Sodium 135 - 145 mmol/L 140  141  137   Potassium 3.5 - 5.1 mmol/L 3.6  3.8  4.1   Chloride 98 - 111 mmol/L 105  104  104   CO2 22 - 32 mmol/L 26  26  24    Calcium 8.9 - 10.3 mg/dL 9.1  9.4  9.2   Total Protein 6.5 - 8.1 g/dL 6.9  7.2  7.0   Total Bilirubin 0.0 - 1.2 mg/dL 0.4  0.3  0.4   Alkaline Phos 38 - 126 U/L 68  77  83   AST 15 - 41 U/L 24  21  22    ALT 0 - 44 U/L 31  28  20       RADIOGRAPHIC STUDIES: I have personally reviewed the radiological images as listed and agreed with the findings in the report. MR BRAIN W WO CONTRAST Result Date: 08/05/2023 CLINICAL DATA:  Metastatic lung cancer, restaging EXAM: MRI HEAD WITHOUT AND WITH CONTRAST TECHNIQUE: Multiplanar, multiecho pulse sequences of the brain and surrounding structures were obtained without and with intravenous contrast. CONTRAST:   9mL GADAVIST GADOBUTROL 1 MMOL/ML IV SOLN COMPARISON:  07/03/2023 FINDINGS: Brain: Redemonstrated peripherally enhancing mass in the medial left postcentral gyrus, which measures up to 2.4 x 2.2 x 1.8 cm (AP x TR x CC) (series 18, image 26 and series 16, image 143), previously 2.0 x 2.1 x 1.8 cm when remeasured similarly. Increased associated T2 hyperintense signal (series 9, image 59). Enhancing mass in the left precentral gyrus measures 1.6 x 1.3 x 1.7 cm (AP x TR x CC) (series 16, image 112 and series 18, image 32), previously 1.5 x 1.3 x 1.8 cm, overall unchanged. Unchanged surrounding T2 hyperintense signal. Minimal peripheral enhancement about the lesion in the right occipital lobe, which measures up to 0.9 x 0.8 x 0.6 cm (AP x TR x CC) (series 16, image 62 and series 18, image 12), previously 1.0 x 1.1 x 0.8 cm. Decreased surrounding T2 hyperintense signal. Diminishing enhancement in a previously noted left cerebellar lesion (series 16, image 43). No new abnormal enhancement. No acute infarct, hemorrhage, or midline shift. No hydrocephalus or extra-axial collection. Vascular: Normal arterial flow voids. Normal arterial and venous enhancement. Skull and upper cervical spine: Normal marrow signal. Sinuses/Orbits: Clear paranasal  sinuses. No acute finding in the orbits. Other: The mastoid air cells are well aerated. IMPRESSION: 1. Slight interval increase in size of the peripherally enhancing mass in the medial left postcentral gyrus, with increased associated T2 hyperintense signal. 2. Unchanged size of the enhancing mass in the left precentral gyrus. 3. Decreased size of the enhancing lesion in the right occipital lobe, with decreased surrounding T2 hyperintense signal. 4. Diminishing enhancement in a previously noted left cerebellar lesion. 5. No new abnormal enhancement. Electronically Signed   By: Wiliam Ke M.D.   On: 08/05/2023 14:50   VAS [place] ABI WITH/WO TBI Result Date: 07/09/2023  LOWER EXTREMITY  DOPPLER STUDY Patient Name:  Joshua [name]  Date of Exam:   07/09/2023 Medical Rec #: [id number]            Accession #:    [id number] Date of Birth: July 27, 1955           Patient Gender: M Patient Age:   20 years Exam Location:  [facility] Procedure:      VAS [place] ABI WITH/WO TBI Referring Phys: [facility] --------------------------------------------------------------------------------  Indications: Peripheral artery disease, and Routine f/u. Patient reports no              claudication or lower extremity pain. High Risk Factors: Hyperlipidemia, past history of smoking. Other Factors: ASO s/p multiple interventions and bypass.                11/15/2019 Right thrombectomy of Fempop insitu graft with added                stent throughout.                 12/01/2019: Aortogram and Selective Right Lower Extremity                Angiogram. 6 mg of TPA delivered in a lysis catheterfrom the                Origin of the Right Femoral -Popliteal bypass down the proximal                ATA with placement of the lysis.                 12/02/2019: PTA of the Right Posterior Tibial Artery. PTA of the                Right Peroneal Artery. Mechanical Thrombectomy of the Right                Femoral -Popliteal Bypass graft. Mechanical Thrombectomy of the                Right Tibioperoneal Trunk and Posterior Tibial Artery.                 09/25/2021: Right Lower Extremity Angiography third order                catheter placement. PTA of the proximal peroneal and                tibioperoneal trunk to 2.5 mm with an ultra score balloon. PTA of                the Femoral Artery and the proximal anastomosis of the bypass                graft to 4 mm with an ultra score balloon. Administration of 5 mg  of tpa throughout the entire bypass and in the peroneal Artery.                Initiation of continuous tpa infusion Right lower Extremity for                limb salvage. Insertion of a triple lumen  catheter Left CFV with                [place] and fluoroscopic guidance.                 09/26/2021: Right LE Angiogram. PTA of the Right Posterior Tibial                Artery with 2.5 mm diameter by 30 cm length angioplasty balloon.                PTA of the distal bypass anastomosis and distal bypass graft                analogous to the Popliteal artery with 4 mm diameter by 8 cm                length Lutonix drug coated angioplasty balloon.                04/08/2022 right thrombectomy of the Deep profunda.  Vascular Interventions: Hx of multiple right lower extremity PTA's/stents.                         09/10/2016 Right femoral to below the knee popliteal                         artery bypass graft placement. 5/01 Right bypass graft &                         mid popliteal artery PTA & coil embolectomy of large                         branch of bypass graft. 12/09/16 Right distal bypass                         graft PTA/stent and right posterior tibial artery PTA.                         03/23/2020 Rt PTA thrombectomy. Rt Profunda fem artery to                         PTA bypass. Performing Technologist: Hardie Lora RVT  Examination Guidelines: A complete evaluation includes at minimum, Doppler waveform signals and systolic blood pressure reading at the level of bilateral brachial, anterior tibial, and posterior tibial arteries, when vessel segments are accessible. Bilateral testing is considered an integral part of a complete examination. Photoelectric Plethysmograph (PPG) waveforms and toe systolic pressure readings are included as required and additional duplex testing as needed. Limited examinations for reoccurring indications may be performed as noted.  ABI Findings: +---------+------------------+-----+----------+--------+ Right    Rt Pressure (mmHg)IndexWaveform  Comment  +---------+------------------+-----+----------+--------+ Brachial 134                                        +---------+------------------+-----+----------+--------+ PTA  59                0.44 monophasic         +---------+------------------+-----+----------+--------+ DP       47                0.35 monophasic         +---------+------------------+-----+----------+--------+ Great Toe32                0.24                    +---------+------------------+-----+----------+--------+ +---------+------------------+-----+---------+-------+ Left     Lt Pressure (mmHg)IndexWaveform Comment +---------+------------------+-----+---------+-------+ Brachial 131                                     +---------+------------------+-----+---------+-------+ PTA      151               1.13 triphasic        +---------+------------------+-----+---------+-------+ DP       148               1.10 triphasic        +---------+------------------+-----+---------+-------+ Great Toe82                0.61                  +---------+------------------+-----+---------+-------+ +-------+-----------+-----------+------------+------------+ ABI/TBIToday's ABIToday's TBIPrevious ABIPrevious TBI +-------+-----------+-----------+------------+------------+ Right  0.44       0.24       0.45        0.43         +-------+-----------+-----------+------------+------------+ Left   1.13       0.61       1.03        1.02         +-------+-----------+-----------+------------+------------+  Bilateral ABIs appear essentially unchanged compared to prior study on 01/05/2023.  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 The right toe-brachial index is abnormal. Left: Resting left ankle-brachial index is within normal range. The left toe-brachial index is abnormal. *See table(s) above for measurements and observations.  Electronically signed by Levora Dredge MD on 07/09/2023 at 4:17:26 PM.    Final    MR BRAIN W WO CONTRAST Result Date:  07/03/2023 CLINICAL DATA:  Brain/CNS neoplasm, assess treatment response. Metastatic lung cancer, restaging. EXAM: MRI HEAD WITHOUT AND WITH CONTRAST TECHNIQUE: Multiplanar, multiecho pulse sequences of the brain and surrounding structures were obtained without and with intravenous contrast. CONTRAST:  9mL GADAVIST GADOBUTROL 1 MMOL/ML IV SOLN COMPARISON:  MRI brain 04/16/2023. FINDINGS: Brain: Continued increase in size of the peripherally enhancing mass centered within the medial aspect of the left postcentral gyrus, now measuring up to 22 mm (coronal image 14 series 17), previously 17 mm. Associated increase in surrounding vasogenic edema. Overall unchanged size but decreased central necrotic component of the 17 mm lesion in the lateral aspect of the left precentral gyrus (coronal image 24 series 17) with associated increase in surrounding vasogenic edema. Unchanged peripherally enhancing lesion in the right occipital lobe (axial image 65 series 16). Slightly decreased punctate enhancing lesion in anterior aspect of the left cerebellar hemisphere (axial image 44 series 16). No acute infarct or hemorrhage. No hydrocephalus or extra-axial collection. No midline shift. Vascular: Normal flow voids and vessel enhancement. Skull and upper cervical spine: Normal marrow signal and enhancement. Sinuses/Orbits: No acute findings. Other: None.  IMPRESSION: 1. Continued increase in size of the peripherally enhancing mass centered within the medial aspect of the left postcentral gyrus, now measuring up to 22 mm, previously 17 mm. Associated increase in surrounding vasogenic edema. 2. Overall unchanged size but decreased central necrotic component of the 17 mm lesion in the lateral aspect of the left precentral gyrus with associated increase in surrounding vasogenic edema. 3. Unchanged peripherally enhancing lesion in the right occipital lobe. Slightly decreased punctate enhancing lesion in the anterior aspect of the left  cerebellar hemisphere. Electronically Signed   By: Orvan Falconer M.D.   On: 07/03/2023 13:47   CT CHEST ABDOMEN PELVIS W CONTRAST Result Date: 06/29/2023 CLINICAL DATA:  Lung cancer restaging * Tracking Code: BO * EXAM: CT CHEST, ABDOMEN, AND PELVIS WITH CONTRAST TECHNIQUE: Multidetector CT imaging of the chest, abdomen and pelvis was performed following the standard protocol during bolus administration of intravenous contrast. RADIATION DOSE REDUCTION: This exam was performed according to the departmental dose-optimization program which includes automated exposure control, adjustment of the mA and/or kV according to patient size and/or use of iterative reconstruction technique. CONTRAST:  OMNIPAQUE IOHEXOL 300 MG/ML  SOLN COMPARISON:  03/12/2023 FINDINGS: CT CHEST FINDINGS Cardiovascular: Right chest port catheter. Normal heart size. Left and right coronary artery calcifications. No pericardial effusion. Mediastinum/Nodes: No enlarged mediastinal, hilar, or axillary lymph nodes. Thyroid gland, trachea, and esophagus demonstrate no significant findings. Lungs/Pleura: Minimal paraseptal emphysema. Unchanged post treatment/post radiation appearance of the right lung, with perihilar and suprahilar paramedian fibrosis and consolidation (series 4, image 59). No pleural effusion or pneumothorax. Musculoskeletal: No chest wall abnormality. No acute osseous findings. CT ABDOMEN PELVIS FINDINGS Hepatobiliary: No solid liver abnormality is seen. Hepatic steatosis. No gallstones, gallbladder wall thickening, or biliary dilatation. Pancreas: Unremarkable. No pancreatic ductal dilatation or surrounding inflammatory changes. Spleen: Normal in size without significant abnormality. Adrenals/Urinary Tract: Adrenal glands are unremarkable. Kidneys are normal, without renal calculi, solid lesion, or hydronephrosis. Bladder is unremarkable. Stomach/Bowel: Stomach is within normal limits. Appendix appears normal. No evidence  of bowel wall thickening, distention, or inflammatory changes. Sigmoid diverticulosis. Vascular/Lymphatic: Aortic atherosclerosis. No enlarged abdominal or pelvic lymph nodes. Reproductive: No mass or other abnormality. Other: No abdominal wall hernia or abnormality. No ascites. Musculoskeletal: No acute osseous findings. IMPRESSION: 1. Unchanged post treatment/post radiation appearance of the right lung, with perihilar and suprahilar paramedian fibrosis and consolidation. 2. No evidence of lymphadenopathy or metastatic disease in the chest, abdomen, or pelvis. 3. Hepatic steatosis. 4. Coronary artery disease. Aortic Atherosclerosis (ICD10-I70.0) and Emphysema (ICD10-J43.9). Electronically Signed   By: Jearld Lesch M.D.   On: 06/29/2023 07:39

## 2023-08-13 NOTE — Patient Instructions (Addendum)
[facility] - A DEPT OF [facility]  Discharge Instructions: Thank you for choosing [facility] to provide your oncology and hematology care.  If you have a lab appointment with the Cancer Center, please go directly to the Cancer Center and check in at the registration area.  Wear comfortable clothing and clothing appropriate for easy access to any Portacath or PICC line.   We strive to give you quality time with your provider. You may need to reschedule your appointment if you arrive late (15 or more minutes).  Arriving late affects you and other patients whose appointments are after yours.  Also, if you miss three or more appointments without notifying the office, you may be dismissed from the clinic at the provider's discretion.      For prescription refill requests, have your pharmacy contact our office and allow 72 hours for refills to be completed.     To help prevent nausea and vomiting after your treatment, we encourage you to take your nausea medication as directed.  BELOW ARE SYMPTOMS THAT SHOULD BE REPORTED IMMEDIATELY: *FEVER GREATER THAN 100.4 F (38 C) OR HIGHER *CHILLS OR SWEATING *NAUSEA AND VOMITING THAT IS NOT CONTROLLED WITH YOUR NAUSEA MEDICATION *UNUSUAL SHORTNESS OF BREATH *UNUSUAL BRUISING OR BLEEDING *URINARY PROBLEMS (pain or burning when urinating, or frequent urination) *BOWEL PROBLEMS (unusual diarrhea, constipation, pain near the anus) TENDERNESS IN MOUTH AND THROAT WITH OR WITHOUT PRESENCE OF ULCERS (sore throat, sores in mouth, or a toothache) UNUSUAL RASH, SWELLING OR PAIN  UNUSUAL VAGINAL DISCHARGE OR ITCHING   Items with * indicate a potential emergency and should be followed up as soon as possible or go to the Emergency Department if any problems should occur.  Please show the CHEMOTHERAPY ALERT CARD or IMMUNOTHERAPY ALERT CARD at check-in to the Emergency Department and triage nurse.  Should you have  questions after your visit or need to cancel or reschedule your appointment, please contact [facility] - A DEPT OF [facility]  [phone] and follow the prompts.  Office hours are 8:00 a.m. to 4:30 p.m. Monday - Friday. Please note that voicemails left after 4:00 p.m. may not be returned until the following business day.  We are closed weekends and major holidays. You have access to a nurse at all times for urgent questions. Please call the main number to the clinic [phone] and follow the prompts.  For any non-urgent questions, you may also contact your provider using MyChart. We now offer e-Visits for anyone 57 and older to request care online for non-urgent symptoms. For details visit [email or web address].   Also download the MyChart app! Go to the app store, search "MyChart", open the app, select [place], and log in with your MyChart username and password.   [facility]- ph # [phone]

## 2023-08-13 NOTE — Assessment & Plan Note (Signed)
 continue levothyroxine 125 mcg daily

## 2023-08-13 NOTE — Assessment & Plan Note (Addendum)
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  May 2024 CT scan showed Improving bilateral scattered interstitial and ground-glass opacities. Discussed with pulmonology Dr. Jayme Cloud. 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  Labs are reviewed and discussed with patient. Proceed with  Durvalumab.

## 2023-08-13 NOTE — Progress Notes (Signed)
C/o headache 5/10 since this morning.  Fell last week, lost balance, no injury. States that when he fell, his right arm went numb but ok now.  MRI brain 08/05/23.

## 2023-08-14 ENCOUNTER — Encounter: Payer: Self-pay | Admitting: Internal Medicine

## 2023-08-14 ENCOUNTER — Inpatient Hospital Stay (HOSPITAL_BASED_OUTPATIENT_CLINIC_OR_DEPARTMENT_OTHER): Payer: HMO | Admitting: Internal Medicine

## 2023-08-14 VITALS — BP 139/82 | HR 90 | Temp 97.8°F | Resp 20 | Wt 219.2 lb

## 2023-08-14 DIAGNOSIS — Z5112 Encounter for antineoplastic immunotherapy: Secondary | ICD-10-CM | POA: Diagnosis not present

## 2023-08-14 DIAGNOSIS — C7931 Secondary malignant neoplasm of brain: Secondary | ICD-10-CM | POA: Diagnosis not present

## 2023-08-14 NOTE — Progress Notes (Signed)
[facility] [address]  [place], [place] [address] [phone]   Interval Evaluation  Date of Service: 08/14/23 Patient Name: Joshua [name] Patient MRN: [id number] Patient DOB: 22-May-1956 Provider: Henreitta Leber, MD  Identifying Statement:  Joshua [name] is a 68 y.o. male with Malignant neoplasm metastatic to brain [facility])    Primary Cancer:  Oncologic History: Oncology History  Squamous cell carcinoma of right lung (HCC)  09/18/2020 Imaging   PET scan showed right upper lobe nodule maximal SUV 11.5.  Hypermetabolic cluster right paratracheal adenopathy with maximum SUV up to 12.  No findings of metastatic disease to the neck/abdomen/pelvis or skeleton.   09/20/2020 Initial Diagnosis   Squamous cell carcinoma of right lung (HCC) NGS showed PD-L1 TPS 95%, LRP1B S970fs, PIK3CA E545K, RB1 c1422-2A>T, RIT1 T83_ A84del, STK11 P275fs, TPS53 R248W, TMB 19.56mut/mb, MS stable.    09/20/2020 Cancer Staging   Staging form: Lung, AJCC 8th Edition - Clinical stage from 09/20/2020: Stage IV (cT1b, cN2, cM1) - Signed by [name], MD on 12/13/2021   10/03/2020 Imaging   MRI brain with and without contrast showed no evidence of intracranial metastatic disease. Well-defined round lesion within the left parotid tail measuring approximately 2.3 x 1.8 x 1.8 cm   10/09/2020 - 11/27/2020 Radiation Therapy   concurrent chemoradiation   10/10/2020 - 10/31/2020 Chemotherapy   10/10/2020 concurrent chemoradiation.  carboplatin AUC of 2 and Taxol 45 mg/m2 x 4.  Additional chemotherapy was held due to thrombocytopenia, neutropenia       12/31/2020 - 03/21/2022 Chemotherapy   Maintenance  Durvalumab q14d      12/31/2020 -  Chemotherapy   Patient is on Treatment Plan : LUNG Durvalumab (10) q14d     03/28/2021 Imaging   CT without contrast showed new linear right perihilar consolidation with associated traction bronchiectasis.  Likely postradiation changes.   Additional new peribronchial vascular/nodular groundglass opacities with areas of consolidation or seen primarily in the right upper and lower lobes, more distant from expected radiation fields. 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  Per my discussion with pulmonology Dr. 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     05/09/2021 Imaging   Repeat CT chest without contrast showed evolving radiation changes in the right perihilar region likely accounting for interval increase to thickening of the posterior wall of the right upper lobe bronchus.  The more peripheral right lung groundglass opacities seen on most recent study have generally improved with exception of one peripherally right upper lobe which appears new.  These are likely inflammatory.  No findings highly suspicious for local recurrence.   05/30/2021 - 01/10/2022 Chemotherapy   Durvalumab q14d     10/07/2021 Imaging   MRI brain with and without contrast showed 2 contrast-enhancing lesions, most consistent with metastatic disease.  Largest lesion is in the left frontal lobe and measures up to 1.3 cm with a large amount of surrounding edema and 9 mm rightward midline shift.  The other enhancing lesion was 4 mm in the right occipital lobe with a small amount of surrounding edema.  No acute or chronic hemorrhage.   10/07/2021 Progression   Headache after colonoscopy, progressive difficulty using her right arm.  Progressively worsening of speech. Stage IV lung cancer with brain metastasis.  started on Dexamethasone.  He was seen by neurosurgery and radiation oncology.  Recommendation is  to proceed with SRS.  Patient was discharged with dexamethasone   10/16/2021 Imaging   PET scan showed postradiation changes in the right supra hilar lung without evidence of local recurrence.  No evidence of lung cancer recurrence  or metastatic disease.  Single focus of intense metabolic activity associated with posterior left 11th rib likely secondary to recent fall/posttraumatic metabolic changes.  Frontal  uptake in the left frontal lobe corresponding to the known metastatic lesion in the brain   10/29/2021 -  Radiation Therapy   Brain Radiation [facility]   12/10/2021 Imaging   Brain MRI: Continued interval decrease in size of a left frontal lobe metastasis, now measuring 10 mm. Moderate surrounding edema has also decreased.  3-4 mm metastasis within the right occipital lobe, not significantly changed in size. There is now a small focus of central non enhancement within this lesion. Increased edema. 3 mm enhancing metastasis within the high left parietal lobe. In retrospect, this was likely present as a subtle punctate focus of enhancement on the prior brain MRI of 10/22/2021.   01/18/2022 Imaging   CT chest abdomen pelvis w contrast 1. Stable post treatment changes in the right upper lobe medially with dense radiation fibrosis but no findings to suggest local recurrent tumor. 2. Significant progression of ill-defined ground-glass opacity, interstitial thickening and airspace nodularity in the right upper lobe and upper aspect of the right lower lobe. Findings could reflect an inflammatory or atypical infectious process, drug induced pneumonitis or interstitial spread of tumor.3. No mediastinal or hilar mass or adenopathy. 4. No findings for abdominal/pelvic metastatic disease or osseous metastatic disease.   02/19/2022 Imaging   MRI brain w wo contrast showed Three metastatic deposits in the brain appear smaller. Decreased edema in the left parietal lobe. No new lesions    04/04/2022 Imaging   CT chest w contrast 1. Stable radiation changes in the right perihilar region without evidence of local recurrence. 2. Interval improvement in the patchy ground-glass opacities previously demonstrated in both lungs, most consistent with a  resolving infectious or inflammatory process (including immunotherapy-related pneumonitis). 3. No evidence of metastatic disease.   05/14/2022 Miscellaneous   He had a mechanical fall and fractured his left elbow. xrays showed a left elbow fracture  05/26/22 seen by orthopedic surgeon, cast applied.    05/27/2022 Imaging   MRI brain w wo contrast 1. All three known brain metastases have enlarged since August, with increasing regional edema. And there is a small new left anterior cerebellar 4-5 mm metastasis (was questionably punctate on the May MRI). This constellation could reflect a combination of True Progression (Left Cerebellar lesion) and pseudoprogression/radionecrosis (3 existing mets).    2. No significant intracranial mass effect. 3. Underlying moderate cerebral white matter signal changes.   05/30/2022 Imaging   CT chest abdomen pelvis w contrast  1. 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 There is a stable small right pleural effusion, but no definitive findings to suggest residual or recurrent disease on today's examination. 2. No signs of metastatic disease in the abdomen or pelvis.  3. Aortic atherosclerosis, in addition to two-vessel coronary artery disease. Please note that although the presence of coronary artery calcium documents the presence of coronary artery disease, the severity of this disease and any potential stenosis cannot be assessed on this non-gated CT examination. Assessment for potential risk factor modification, dietary therapy or pharmacologic therapy may be warranted, if clinically indicated.  4. Hepatic steatosis. 5. Colonic diverticulosis without evidence of acute diverticulitis at this time. 6. Incompletely imaged but apparently occluded vascular graft in the superior aspect of the right anterior thigh. Referral to vascular specialist  for further clinical evaluation should be considered if clinically appropriate. 7. Additional incidental findings, as above.   07/22/2022 Imaging   MRI brain  1. No new or enlarging lesions. No intracranial disease progression. 2. Decreased size of treated lesions of the left frontoparietal junction and left postcentral gyrus. 3. Unchanged lesions in the left cerebellum and right occipital lobe.   10/15/2022 Imaging   MRI brain w wo contrast showed Interval increase in size of multiple intraparenchymal lesions with worsened surrounding vasogenic edema. 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 No new lesions   11/05/2022 Imaging   MRI brain w wo contrast  1. Likely slight increase and right occipital and left cerebellar lesions. Otherwise, unchanged metastases, as above. No new lesions identified. 2. Partially imaged large probable lymph node in the left upper neck, apparent on prior PET CT.   11/13/2022 -  Radiation Therapy   Each of the sites were treated with SRS/SBRT/SRT-IMRT technique to 20 Gy in 1 fraction to each of the following sites: PTV_2_LCerebel_9mm PTV_3_RCerebel_60mm PTV_4_ROccip_11mm PTV_5_LParietal_3mm   11/27/2022 Imaging   CT chest abdomen pelvis w contrast showed  Improving bilateral scattered interstitial and ground-glass opacities. Small amount of residual areas in the extreme left lower lobe inferiorly. This specific area is slightly increased from previous but overall the amount of opacities is significantly improved. Recommend continued follow-up. No developing new mass lesion, fluid collection or lymph node enlargement. Stable posttreatment changes right perihilar in the lung. Fatty liver infiltration. Colonic diverticulosis. Improved tiny right pleural effusion   02/05/2023 Imaging   MRI brain with and without contrast Interval decrease in all of the metastatic lesions, detailed above.No  new lesions identified.    Imaging   CT chest abdomen pelvis w contrast showed  1. 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 2. Trace right pleural fluid or thickening, new or minimally increased. 3. Incidental findings, including: Aortic atherosclerosis (ICD10-I70.0), coronary artery atherosclerosis and emphysema (ICD10-J43.9). Hepatic steatosis.   06/25/2023 Imaging   CT chest abdomen pelvis w contrast showed 1. Unchanged post treatment/post radiation appearance of the right lung, with perihilar and suprahilar paramedian fibrosis and consolidation. 2. No evidence of lymphadenopathy or metastatic disease in the chest, abdomen, or pelvis. 3. Hepatic steatosis. 4. Coronary artery disease.   Aortic Atherosclerosis (ICD10-I70.0) and Emphysema (ICD10-J43.9).    CNS Oncologic History 10/29/21: SRS to left frontal metastasis ([place]) 11/13/22: salvage SRS x5 with Dr. Mitzi Hansen  Interval History: Joshua [name] presents today for follow up after recent MRI brain.  Did well with the steroids, now off.  No new or progressive changes, still has sporadic headaches.  Continues to ambulate with a cane as prior.  Remains on Imfinzi immunotherapy with Dr. Cathie Hoops, no longer dosing decadron.  H+P (06/20/22) Patient presents today for evaluation for neurologic complaints.  He describes gradual onset of impaired speech output and right arm greater than right leg weakness.  This led to a fall and an injury to his left arm.  He was started on decadron two weeks ago, this has led to considerable improvement in symptoms.  Today he feels at his prior baseline.  No issues tolerating the steroids.  He is due for durvalumab infusion today  with Dr. Cathie Hoops.   Medications: Current Outpatient Medications on File Prior to Visit  Medication Sig Dispense Refill   aspirin EC 81 MG tablet Take 81 mg by mouth daily. Swallow whole.     clopidogrel (PLAVIX) 75  MG tablet TAKE 1 TABLET BY MOUTH DAILY 100 tablet 2   levothyroxine (SYNTHROID) 125 MCG tablet Take 1 tablet (125 mcg total) by mouth daily before breakfast. 90 tablet 0   rosuvastatin (CRESTOR) 40 MG tablet Take 1 tablet (40 mg total) by mouth daily. 90 tablet 3   Vitamin D, Ergocalciferol, (DRISDOL) 1.25 MG (50000 UNIT) CAPS capsule Take 1 capsule (50,000 Units total) by mouth every 7 (seven) days. 13 capsule 3   Current Facility-Administered Medications on File Prior to Visit  Medication Dose Route Frequency Provider Last Rate Last Admin   albuterol (PROVENTIL) (2.5 MG/3ML) 0.083% nebulizer solution 2.5 mg  2.5 mg Nebulization Once Salena Saner, MD       heparin lock flush 100 unit/mL  500 Units Intracatheter Once PRN [name], MD        Allergies: No Known Allergies Past Medical History:  Past Medical History:  Diagnosis Date   Allergy    Cancer (HCC)    lung   COPD (chronic obstructive pulmonary disease) (HCC)    Dyspnea    former smoker. 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   Peripheral vascular disease (HCC)    Pre-diabetes    Sleep apnea    Past Surgical History:  Past Surgical History:  Procedure Laterality Date   ANTERIOR CERVICAL DECOMP/DISCECTOMY FUSION N/A 03/14/2020   Procedure: ANTERIOR CERVICAL DECOMPRESSION/DISCECTOMY FUSION 3 LEVELS C4-7;  Surgeon: Venetia Night, MD;  Location: [facility] ORS;  Service: Neurosurgery;  Laterality: N/A;   BACK SURGERY  2011   CENTRAL LINE INSERTION Right 09/10/2016   Procedure: CENTRAL LINE INSERTION;  Surgeon: Renford Dills, MD;  Location: [facility] ORS;  Service: Vascular;  Laterality: Right;   CERVICAL FUSION     C4-c7 on Mar 14, 2020   COLONOSCOPY  2013 ?   COLONOSCOPY N/A 09/09/2021   Procedure: COLONOSCOPY;  Surgeon: Jaynie Collins, DO;  Location[facility]COPY;  Service: Gastroenterology;  Laterality: N/A;   CORONARY ANGIOPLASTY      ESOPHAGOGASTRODUODENOSCOPY N/A 09/09/2021   Procedure: ESOPHAGOGASTRODUODENOSCOPY (EGD);  Surgeon: Jaynie Collins, DO;  Location: [facility] ENDOSCOPY;  Service: Gastroenterology;  Laterality: N/A;   FEMORAL-POPLITEAL BYPASS GRAFT Right 09/10/2016   Procedure: BYPASS GRAFT FEMORAL-POPLITEAL ARTERY ( BELOW KNEE );  Surgeon: Renford Dills, MD;  Location: [facility] ORS;  Service: Vascular;  Laterality: Right;   FEMORAL-TIBIAL BYPASS GRAFT Right 03/23/2020   Procedure: BYPASS GRAFT RIGHT FEMORAL- DISTAL TIBIAL ARTERY WITH DISTAL FLOW GRAFT;  Surgeon: Renford Dills, MD;  Location: [facility] ORS;  Service: Vascular;  Laterality: Right;   LEFT HEART CATH AND CORONARY ANGIOGRAPHY Left 05/16/2021   Procedure: LEFT HEART CATH AND CORONARY ANGIOGRAPHY;  Surgeon: Armando Reichert, MD;  Location[facility] CV LAB;  Service: Cardiovascular;  Laterality: Left;   LOWER EXTREMITY ANGIOGRAPHY Right 11/18/2016   Procedure: Lower Extremity Angiography;  Surgeon: Renford Dills, MD;  Location: [facility] INVASIVE CV LAB;  Service: Cardiovascular;  Laterality: Right;   LOWER EXTREMITY ANGIOGRAPHY Right 12/09/2016   Procedure: Lower Extremity Angiography;  Surgeon: Renford Dills, MD;  Location: [facility] INVASIVE CV LAB;  Service: Cardiovascular;  Laterality: Right;   LOWER EXTREMITY ANGIOGRAPHY Right  11/15/2019   Procedure: LOWER EXTREMITY ANGIOGRAPHY;  Surgeon: Renford Dills, MD;  Location: [facility] INVASIVE CV LAB;  Service: Cardiovascular;  Laterality: Right;   LOWER EXTREMITY ANGIOGRAPHY Right 12/01/2019   Procedure: Lower Extremity Angiography;  Surgeon: Annice Needy, MD;  Location: [facility] INVASIVE CV LAB;  Service: Cardiovascular;  Laterality: Right;   LOWER EXTREMITY ANGIOGRAPHY Right 12/02/2019   Procedure: LOWER EXTREMITY ANGIOGRAPHY;  Surgeon: Renford Dills, MD;  Location: [facility] INVASIVE CV LAB;  Service: Cardiovascular;  Laterality: Right;   LOWER EXTREMITY ANGIOGRAPHY Right 03/23/2020   Procedure: Lower  Extremity Angiography;  Surgeon: Renford Dills, MD;  Location: [facility] INVASIVE CV LAB;  Service: Cardiovascular;  Laterality: Right;   LOWER EXTREMITY ANGIOGRAPHY Right 09/25/2021   Procedure: Lower Extremity Angiography;  Surgeon: Renford Dills, MD;  Location: [facility] INVASIVE CV LAB;  Service: Cardiovascular;  Laterality: Right;   LOWER EXTREMITY ANGIOGRAPHY Right 09/26/2021   Procedure: Lower Extremity Angiography;  Surgeon: Annice Needy, MD;  Location: [facility] INVASIVE CV LAB;  Service: Cardiovascular;  Laterality: Right;   LOWER EXTREMITY ANGIOGRAPHY Right 04/08/2022   Procedure: Lower Extremity Angiography;  Surgeon: Renford Dills, MD;  Location: [facility] INVASIVE CV LAB;  Service: Cardiovascular;  Laterality: Right;   LOWER EXTREMITY INTERVENTION  11/18/2016   Procedure: Lower Extremity Intervention;  Surgeon: Renford Dills, MD;  Location: [facility] INVASIVE CV LAB;  Service: Cardiovascular;;   PERIPHERAL VASCULAR CATHETERIZATION Right 01/02/2015   Procedure: Lower Extremity Angiography;  Surgeon: Renford Dills, MD;  Location: [facility] INVASIVE CV LAB;  Service: Cardiovascular;  Laterality: Right;   PERIPHERAL VASCULAR CATHETERIZATION Right 06/18/2015   Procedure: Lower Extremity Angiography;  Surgeon: Annice Needy, MD;  Location: [facility] INVASIVE CV LAB;  Service: Cardiovascular;  Laterality: Right;   PERIPHERAL VASCULAR CATHETERIZATION  06/18/2015   Procedure: Lower Extremity Intervention;  Surgeon: Annice Needy, MD;  Location: [facility] INVASIVE CV LAB;  Service: Cardiovascular;;   PERIPHERAL VASCULAR CATHETERIZATION N/A 10/09/2015   Procedure: Abdominal Aortogram w/Lower Extremity;  Surgeon: Renford Dills, MD;  Location: [facility] INVASIVE CV LAB;  Service: Cardiovascular;  Laterality: N/A;   PERIPHERAL VASCULAR CATHETERIZATION  10/09/2015   Procedure: Lower Extremity Intervention;  Surgeon: Renford Dills, MD;  Location: [facility] INVASIVE CV LAB;  Service: Cardiovascular;;   PERIPHERAL VASCULAR  CATHETERIZATION Right 10/10/2015   Procedure: Lower Extremity Angiography;  Surgeon: Annice Needy, MD;  Location: [facility] INVASIVE CV LAB;  Service: Cardiovascular;  Laterality: Right;   PERIPHERAL VASCULAR CATHETERIZATION Right 10/10/2015   Procedure: Lower Extremity Intervention;  Surgeon: Annice Needy, MD;  Location: [facility] INVAS[facility] CV LAB;  Service: Cardiovascular;  Laterality: Right;   PERIPHERAL VASCULAR CATHETERIZATION Right 12/03/2015   Procedure: Lower Extremity Angiography;  Surgeon: Annice Needy, MD;  Location: [facility] IN[facility];  Service: Cardiovascular;  Laterality: Right;   PERIPHERAL VASCULAR CATHETERIZATION Right 12/04/2015   Procedure: Lower Extremity Angiography;  Surgeon: Annice Needy, MD;  Location: [facility] INVAS[facility] LAB;  Service: Cardiovascular;  Laterality: Right;   PERIPHERAL VASCULAR CATHETERIZATION  12/04/2015   Procedure: Lower Extremity Intervention;  Surgeon: Annice Needy, MD;  Location: [facility] INVAS[facility] LAB;  Service: Cardiovascular;;   PERIPHERAL VASCULAR CATHETERIZATION Right 06/30/2016   Procedure: Lower Extremity Angiography;  Surgeon: Annice Needy, MD;  Location: [facility] INVASIVE CV LAB;  Service: Cardiovascular;  Laterality: Right;   PERIPHERAL VASCULAR CATHETERIZATION Right 06/25/2016   Procedure: Lower Extremity Angiography;  Surgeon: Renford Dills, MD;  Location: [facility] INVASIVE CV LAB;  Service: Cardiovascular;  Laterality: Right;   PERIPHERAL VASCULAR CATHETERIZATION Right 07/01/2016   Procedure: Lower Extremity Angiography;  Surgeon: Renford Dills, MD;  Location: [facility] INVASIVE CV LAB;  Service: Cardiovascular;  Laterality: Right;   PERIPHERAL VASCULAR CATHETERIZATION  07/01/2016   Procedure: Lower Extremity Intervention;  Surgeon: Renford Dills, MD;  Location: [facility] INVASIVE CV LAB;  Service: Cardiovascular;;   PERIPHERAL VASCULAR CATHETERIZATION Right 08/01/2016   Procedure: Lower Extremity Angiography;  Surgeon: Renford Dills, MD;  Location: [facility] INVASIVE CV  LAB;  Service: Cardiovascular;  Laterality: Right;   PORTA CATH INSERTION N/A 10/05/2020   Procedure: PORTA CATH INSERTION;  Surgeon: Renford Dills, MD;  Location: [facility] INVASIVE CV LAB;  Service: Cardiovascular;  Laterality: N/A;   stent placement in right leg Right    VIDEO BRONCHOSCOPY N/A 09/14/2020   Procedure: VIDEO BRONCHOSCOPY WITH FLUORO;  Surgeon: Salena Saner, MD;  Location: [facility] ORS;  Service: Cardiopulmonary;  Laterality: N/A;   VIDEO BRONCHOSCOPY WITH ENDOBRONCHIAL ULTRASOUND N/A 09/14/2020   Procedure: VIDEO BRONCHOSCOPY WITH ENDOBRONCHIAL ULTRASOUND;  Surgeon: Salena Saner, MD;  Location: [facility] ORS;  Service: Cardiopulmonary;  Laterality: N/A;   Social History:  Social History   Socioeconomic History   Marital status: Single    Spouse name: Not on file   Number of children: Not on file   Years of education: Not on file   Highest education level: Not on file  Occupational History   Occupation: unemployed  Tobacco Use   Smoking status: Former    Current packs/day: 0.00    Average packs/day: 2.0 packs/day for 38.0 years (76.0 ttl pk-yrs)    Types: Cigarettes    Start date: 12/19/1976    Quit date: 12/20/2014    Years since quitting: 8.6   Smokeless tobacco: Never   Tobacco comments:    quit smoking in 2017 most smoked 2   Vaping Use   Vaping status: Former  Substance and Sexual Activity   Alcohol use: Not Currently   Drug use: No   Sexual activity: Yes  Other Topics Concern   Not on file  Social History Narrative   Not on file   Social Drivers of Health   Financial Resource Strain: Low Risk  (07/27/2023)   Received from [facility]   Overall Financial Resource Strain (CARDIA)    Difficulty of Paying Living Expenses: Not hard at all  Food Insecurity: No Food Insecurity (07/27/2023)   [facility]   Hunger Vital Sign    Worried About Running Out of Food in the Last Year: Never true    Ran Out of Food  in the Last Year: Never true  Transportation Needs: No Transportation Needs (07/27/2023)   Received from [facility] - Transportation    In the past 12 months, has lack of transportation kept you from medical appointments or from getting medications?: No    Lack of Transportation (Non-Medical): No  Physical Activity: Insufficiently Active (05/29/2023)   Exercise Vital Sign    Days of Exercise per Week: 2 days    Minutes of Exercise per Session: 10 min  Stress: No Stress Concern Present (05/29/2023)   [employer] of Occupational Health - Occupational Stress Questionnaire    Feeling of Stress : Not at all  Social Connections: Unknown (05/29/2023)   Social Connection and Isolation Panel [NHANES]    Frequency of Communication with Friends and Family: More than three times a week  Frequency of Social Gatherings with Friends and Family: Three times a week    Attends Religious Services: Patient declined    Active Member of Clubs or Organizations: No    Attends Engineer, structural: Not on file    Marital Status: Patient declined  Catering manager Violence: Not on file   Family History:  Family History  Problem Relation Age of Onset   Diabetes Mother    Hypertension Mother    Heart murmur Mother    Leukemia Mother    Throat cancer Maternal Grandmother    Colon cancer Neg Hx    Breast cancer Neg Hx     Review of Systems: Constitutional: Doesn't report fevers, chills or abnormal weight loss Eyes: Doesn't report blurriness of vision Ears, nose, mouth, throat, and face: Doesn't report sore throat Respiratory: Doesn't report cough, dyspnea or wheezes Cardiovascular: Doesn't report palpitation, chest discomfort  Gastrointestinal:  Doesn't report nausea, constipation, diarrhea GU: Doesn't report incontinence Skin: Doesn't report skin rashes Neurological: Per HPI Musculoskeletal: Doesn't report joint pain Behavioral/Psych: Doesn't report  anxiety  Physical Exam: Vitals:   08/14/23 1132  BP: 139/82  Pulse: 90  Resp: 20  Temp: 97.8 F (36.6 C)  SpO2: 100%    KPS: 80. General: Alert, cooperative, pleasant, in no acute distress Head: Normal EENT: No conjunctival injection or scleral icterus.  Lungs: Resp effort normal Cardiac: Regular rate Abdomen: Non-distended abdomen Skin: No rashes cyanosis or petechiae. Extremities: Left arm in sling  Neurologic Exam: Mental Status: Awake, alert, attentive to examiner. Oriented to self and environment. Language is fluent with intact comprehension.  Cranial Nerves: Visual acuity is grossly normal. Visual fields are full. Extra-ocular movements intact. No ptosis. Face is symmetric Motor: Tone and bulk are normal. Power is full in both arms and legs, with some impairment in fine motor function in right hand. Reflexes are symmetric, no pathologic reflexes present.  Sensory: Intact to light touch Gait: Normal.   Labs: I have reviewed the data as listed    Component Value Date/Time   NA 140 08/13/2023 1241   NA 138 06/02/2023 1049   K 3.6 08/13/2023 1241   CL 105 08/13/2023 1241   CO2 26 08/13/2023 1241   GLUCOSE 137 (H) 08/13/2023 1241   BUN 17 08/13/2023 1241   BUN 18 06/02/2023 1049   BUN 16 07/03/2014 1205   CREATININE 0.98 08/13/2023 1241   CREATININE 1.11 11/06/2022 1041   CREATININE 1.34 (H) 07/03/2014 1205   CALCIUM 9.1 08/13/2023 1241   PROT 6.9 08/13/2023 1241   PROT 7.0 04/16/2020 1525   ALBUMIN 3.9 08/13/2023 1241   ALBUMIN 4.3 04/16/2020 1525   AST 24 08/13/2023 1241   ALT 31 08/13/2023 1241   ALKPHOS 68 08/13/2023 1241   BILITOT 0.4 08/13/2023 1241   BILITOT 0.4 04/16/2020 1525   GFRNONAA >60 08/13/2023 1241   GFRNONAA >60 11/06/2022 1041   GFRNONAA 59 (L) 07/03/2014 1205   GFRAA >60 03/30/2020 0536   GFRAA >60 07/03/2014 1205   Lab Results  Component Value Date   WBC 4.8 08/13/2023   NEUTROABS 3.0 08/13/2023   HGB 13.3 08/13/2023   HCT 41.7  08/13/2023   MCV 83.9 08/13/2023   PLT 215 08/13/2023    Imaging:  [facility] Clinician Interpretation: I have personally reviewed the CNS images as listed.  My interpretation, in the context of the patient's clinical presentation, is treatment effect vs true progression   MR BRAIN W WO CONTRAST Result Date: 08/05/2023 CLINICAL DATA:  Metastatic lung cancer, restaging EXAM: MRI HEAD WITHOUT AND WITH CONTRAST TECHNIQUE: Multiplanar, multiecho pulse sequences of the brain and surrounding structures were obtained without and with intravenous contrast. CONTRAST:  9mL GADAVIST GADOBUTROL 1 MMOL/ML IV SOLN COMPARISON:  07/03/2023 FINDINGS: Brain: Redemonstrated peripherally enhancing mass in the medial left postcentral gyrus, which measures up to 2.4 x 2.2 x 1.8 cm (AP x TR x CC) (series 18, image 26 and series 16, image 143), previously 2.0 x 2.1 x 1.8 cm when remeasured similarly. Increased associated T2 hyperintense signal (series 9, image 59). Enhancing mass in the left precentral gyrus measures 1.6 x 1.3 x 1.7 cm (AP x TR x CC) (series 16, image 112 and series 18, image 32), previously 1.5 x 1.3 x 1.8 cm, overall unchanged. Unchanged surrounding T2 hyperintense signal. Minimal peripheral enhancement about the lesion in the right occipital lobe, which measures up to 0.9 x 0.8 x 0.6 cm (AP x TR x CC) (series 16, image 62 and series 18, image 12), previously 1.0 x 1.1 x 0.8 cm. Decreased surrounding T2 hyperintense signal. Diminishing enhancement in a previously noted left cerebellar lesion (series 16, image 43). No new abnormal enhancement. No acute infarct, hemorrhage, or midline shift. No hydrocephalus or extra-axial collection. Vascular: Normal arterial flow voids. Normal arterial and venous enhancement. Skull and upper cervical spine: Normal marrow signal. Sinuses/Orbits: Clear paranasal sinuses. No acute finding in the orbits. Other: The mastoid air cells are well aerated. IMPRESSION: 1. Slight interval  increase in size of the peripherally enhancing mass in the medial left postcentral gyrus, with increased associated T2 hyperintense signal. 2. Unchanged size of the enhancing mass in the left precentral gyrus. 3. Decreased size of the enhancing lesion in the right occipital lobe, with decreased surrounding T2 hyperintense signal. 4. Diminishing enhancement in a previously noted left cerebellar lesion. 5. No new abnormal enhancement. Electronically Signed   By: Wiliam Ke M.D.   On: 08/05/2023 14:50    Assessment/Plan Malignant neoplasm metastatic to brain [facility])  Joshua [name] is clinically stable today, now 9 months removed from salvage SRS to 5 additional metastatic foci.    MRI brain demonstrates progression of treated lesion in left post-central gyrus with increased surrounding T2/FLAIR signal abnormality.  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  Recommended continuing close MRI surveillance only at this time.  We appreciate the opportunity to participate in the care of [employer].   We ask that Joshua [name] return to clinic in 2 months following brain MRI, or sooner as needed.  All questions were answered. The patient knows to call the clinic with any problems, questions or concerns. No barriers to learning were detected.  The total time spent in the encounter was 40 minutes and more than 50% was on counseling and review of test results   Henreitta Leber, MD Medical Director of Neuro-Oncology [facility] 08/14/23 11:29 AM

## 2023-08-18 ENCOUNTER — Telehealth: Payer: Self-pay | Admitting: *Deleted

## 2023-08-18 ENCOUNTER — Telehealth: Payer: Self-pay | Admitting: Radiation Therapy

## 2023-08-18 ENCOUNTER — Encounter: Payer: Self-pay | Admitting: Internal Medicine

## 2023-08-18 ENCOUNTER — Other Ambulatory Visit: Payer: Self-pay

## 2023-08-18 ENCOUNTER — Encounter: Payer: Self-pay | Admitting: Oncology

## 2023-08-18 MED ORDER — DEXAMETHASONE 4 MG PO TABS
2.0000 mg | ORAL_TABLET | Freq: Every day | ORAL | 1 refills | Status: DC
  Filled 2023-08-18: qty 15, 30d supply, fill #0

## 2023-08-18 NOTE — Telephone Encounter (Signed)
PC to patient, informed him of Dr Liana Gerold recommendation as below.  He verbalizes understanding.

## 2023-08-18 NOTE — Telephone Encounter (Signed)
-----   Message from Henreitta Leber sent at 08/18/2023  4:30 PM EST ----- Regarding: RE: just received a call from pt Sent out decadron 2mg  daily to his pharmacy.  He can take 4x (8mg ) on day 1 ----- Message ----- From: Dellie Catholic Sent: 08/18/2023   4:25 PM EST To: Arville Care, RN; Charisse Klinefelter, RN; # Subject: just received a call from pt                   I just received a call from Mr. Codispoti. He says that he has been feeling worse since talking to Dr. Barbaraann Cao last week. He is experiencing some difficulty with word finding and slurring his speech. He has weakness of his RT side. Feels like he is dragging his Rt leg and fumbling around with his Rt hand, dropping things. He had a very bad headache all day Sunday, that woke him up. The headache is better today.   He has called requesting recommendation from Dr. Barbaraann Cao.   [name] R.T.(R)(T) Radiation Special Procedures Lead

## 2023-08-18 NOTE — Telephone Encounter (Signed)
I called to inform pt of the recommendation below.   [name] R.T.(R)(T) 1/28 @ 4:38pm    Vaslow, Joshua Lea, MD  Ria Bush, RN; P [facility] Mo Pod 4; Charisse Klinefelter, RN Sent out decadron 2mg  daily to his pharmacy.  He can take 4x (8mg ) on day 1       Previous Messages    ----- Message ----- From: Dellie Catholic Sent: 08/18/2023   4:25 PM EST To: Arville Care, RN; Charisse Klinefelter, RN; * Subject: just received a call from pt                  I just received a call from Joshua [name]. He says that he has been feeling worse since talking to Dr. Barbaraann Cao last week. He is experiencing some difficulty with word finding and slurring his speech. He has weakness of his RT side. Feels like he is dragging his Rt leg and fumbling around with his Rt hand, dropping things. He had a very bad headache all day Sunday, that woke him up. The headache is better today.  He has called requesting recommendation from Dr. Barbaraann Cao.  [name] R.T.(R)(T) Radiation Special Procedures Lead

## 2023-08-19 ENCOUNTER — Other Ambulatory Visit: Payer: Self-pay

## 2023-08-20 ENCOUNTER — Emergency Department: Payer: HMO

## 2023-08-20 ENCOUNTER — Emergency Department
Admission: EM | Admit: 2023-08-20 | Discharge: 2023-08-21 | Disposition: A | Discharge status: Other Acute Inpatient Hospital | Payer: HMO | Attending: Emergency Medicine | Admitting: Emergency Medicine

## 2023-08-20 ENCOUNTER — Encounter: Payer: Self-pay | Admitting: Emergency Medicine

## 2023-08-20 ENCOUNTER — Other Ambulatory Visit: Payer: Self-pay

## 2023-08-20 DIAGNOSIS — R000 Tachycardia, unspecified: Secondary | ICD-10-CM | POA: Diagnosis not present

## 2023-08-20 DIAGNOSIS — R0689 Other abnormalities of breathing: Secondary | ICD-10-CM | POA: Diagnosis not present

## 2023-08-20 DIAGNOSIS — G935 Compression of brain: Secondary | ICD-10-CM

## 2023-08-20 DIAGNOSIS — R29818 Other symptoms and signs involving the nervous system: Secondary | ICD-10-CM | POA: Diagnosis not present

## 2023-08-20 DIAGNOSIS — R4781 Slurred speech: Secondary | ICD-10-CM | POA: Diagnosis not present

## 2023-08-20 DIAGNOSIS — R569 Unspecified convulsions: Secondary | ICD-10-CM | POA: Diagnosis not present

## 2023-08-20 DIAGNOSIS — R29810 Facial weakness: Secondary | ICD-10-CM | POA: Diagnosis not present

## 2023-08-20 DIAGNOSIS — C7931 Secondary malignant neoplasm of brain: Secondary | ICD-10-CM

## 2023-08-20 DIAGNOSIS — I10 Essential (primary) hypertension: Secondary | ICD-10-CM | POA: Diagnosis not present

## 2023-08-20 DIAGNOSIS — D499 Neoplasm of unspecified behavior of unspecified site: Secondary | ICD-10-CM | POA: Diagnosis not present

## 2023-08-20 DIAGNOSIS — G936 Cerebral edema: Secondary | ICD-10-CM | POA: Diagnosis not present

## 2023-08-20 HISTORY — DX: Secondary malignant neoplasm of brain: C79.31

## 2023-08-20 LAB — COMPREHENSIVE METABOLIC PANEL
ALT: 26 U/L (ref 0–44)
AST: 28 U/L (ref 15–41)
Albumin: 4 g/dL (ref 3.5–5.0)
Alkaline Phosphatase: 68 U/L (ref 38–126)
Anion gap: 16 — ABNORMAL HIGH (ref 5–15)
BUN: 18 mg/dL (ref 8–23)
CO2: 24 mmol/L (ref 22–32)
Calcium: 9.4 mg/dL (ref 8.9–10.3)
Chloride: 103 mmol/L (ref 98–111)
Creatinine, Ser: 1.16 mg/dL (ref 0.61–1.24)
GFR, Estimated: >60 mL/min (ref >60)
Glucose, Bld: 136 mg/dL — ABNORMAL HIGH (ref 70–99)
Potassium: 4.4 mmol/L (ref 3.5–5.1)
Sodium: 143 mmol/L (ref 135–145)
Total Bilirubin: 0.6 mg/dL (ref 0.0–1.2)
Total Protein: 7.6 g/dL (ref 6.5–8.1)

## 2023-08-20 LAB — PROTIME-INR
INR: 1 (ref 0.8–1.2)
Prothrombin Time: 13 s (ref 11.4–15.2)

## 2023-08-20 LAB — DIFFERENTIAL
Abs Immature Granulocytes: 0.04 10*3/uL (ref 0.00–0.07)
Basophils Absolute: 0 10*3/uL (ref 0.0–0.1)
Basophils Relative: 0 %
Eosinophils Absolute: 0 10*3/uL (ref 0.0–0.5)
Eosinophils Relative: 0 %
Immature Granulocytes: 1 %
Lymphocytes Relative: 13 %
Lymphs Abs: 0.9 10*3/uL (ref 0.7–4.0)
Monocytes Absolute: 0.1 10*3/uL (ref 0.1–1.0)
Monocytes Relative: 2 %
Neutro Abs: 5.7 10*3/uL (ref 1.7–7.7)
Neutrophils Relative %: 84 %

## 2023-08-20 LAB — CBC
HCT: 42.6 % (ref 39.0–52.0)
Hemoglobin: 13.5 g/dL (ref 13.0–17.0)
MCH: 27.2 pg (ref 26.0–34.0)
MCHC: 31.7 g/dL (ref 30.0–36.0)
MCV: 85.7 fL (ref 80.0–100.0)
Platelets: 225 10*3/uL (ref 150–400)
RBC: 4.97 MIL/uL (ref 4.22–5.81)
RDW: 16.4 % — ABNORMAL HIGH (ref 11.5–15.5)
WBC: 6.9 10*3/uL (ref 4.0–10.5)
nRBC: 0 % (ref 0.0–0.2)

## 2023-08-20 LAB — APTT: aPTT: 28 s (ref 24–36)

## 2023-08-20 MED ORDER — LEVETIRACETAM IN NACL 1000 MG/100ML IV SOLN
1000.0000 mg | Freq: Once | INTRAVENOUS | Status: AC
  Administered 2023-08-21: 1000 mg via INTRAVENOUS
  Filled 2023-08-20: qty 100

## 2023-08-20 MED ORDER — DEXAMETHASONE SODIUM PHOSPHATE 10 MG/ML IJ SOLN
10.0000 mg | Freq: Once | INTRAMUSCULAR | Status: AC
Start: 2023-08-20 — End: 2023-08-20
  Administered 2023-08-20: 10 mg via INTRAVENOUS
  Filled 2023-08-20: qty 1

## 2023-08-20 MED ORDER — LORAZEPAM 2 MG/ML IJ SOLN
1.0000 mg | Freq: Once | INTRAMUSCULAR | Status: AC
  Administered 2023-08-21: 1 mg via INTRAVENOUS
  Filled 2023-08-20: qty 1

## 2023-08-20 MED ORDER — LEVETIRACETAM IN NACL 1000 MG/100ML IV SOLN: 1000.0000 mg | Freq: Once | INTRAVENOUS | Status: DC

## 2023-08-20 MED ORDER — LORAZEPAM 2 MG/ML IJ SOLN
INTRAMUSCULAR | Status: AC
  Administered 2023-08-20: 2 mg via INTRAVENOUS
  Filled 2023-08-20: qty 1

## 2023-08-20 MED ORDER — LEVETIRACETAM IN NACL 1000 MG/100ML IV SOLN
1000.0000 mg | INTRAVENOUS | Status: AC
  Administered 2023-08-20: 1000 mg via INTRAVENOUS
  Filled 2023-08-20: qty 100

## 2023-08-20 MED ORDER — LEVETIRACETAM IN NACL 1500 MG/100ML IV SOLN
1500.0000 mg | Freq: Once | INTRAVENOUS | Status: AC
  Administered 2023-08-21: 1500 mg via INTRAVENOUS
  Filled 2023-08-20: qty 100

## 2023-08-20 MED ORDER — LORAZEPAM 2 MG/ML IJ SOLN: 2.0000 mg | Freq: Once | INTRAMUSCULAR | Status: AC

## 2023-08-20 MED ORDER — LEVETIRACETAM IN NACL 1500 MG/100ML IV SOLN
1500.0000 mg | Freq: Once | INTRAVENOUS | Status: DC
  Filled 2023-08-20: qty 100

## 2023-08-20 NOTE — Progress Notes (Addendum)
Code stroke timeline LKW 2130 MRS 3 2309 Code stroke cart activation, EDP assessment and NCCT obtained prior to cart activation 2312 Neuro [facility] paged 2314 EDP in room, relayed NCCT results 2328 [facility] dispatch called 2332 Neuro [facility] on camera, NCCT results relayed 2345 EDP at bedside discussing case with Neuro [facility] No TNK, no adv imaging at this time. 2352 Neuro [facility] and [facility] off camera  -[place] telestroke RN

## 2023-08-20 NOTE — ED Notes (Signed)
Code Stroke called in the [facility]

## 2023-08-20 NOTE — ED Provider Notes (Signed)
[facility] Provider Note    Event Date/Time   First MD Initiated Contact with Patient 08/20/23 2256     (approximate)   History   Code Stroke   HPI Joshua [name] is a 68 y.o. 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 Cathie Hoops.  He presents by EMS for possible stroke.  Circumstances are very unclear and apparently the patient was by himself at home but he called EMS.  Last known normal is unclear, at least a couple of hours ago, possibly around 9:30 PM.  The patient has slurred speech and right-sided weakness and right-sided facial droop but he is also having seizure-like activity primarily of the right side of his body and right side of his face and difficulty speaking even though he is clearly awake and alert.  He denies having any prior seizures and his medical record does not indicate any antiepileptic medications.  He may have had a fall but again the history is unclear.  He arrived at the hospital at the same time I did at about 10:50 PM.  I saw him together with the outgoing ED physician and we sent him to CT for emergent code stroke head CT to be followed by teleneurology evaluation.     Physical Exam   ED Triage Vitals [08/20/23 2317]  Encounter Vitals Group     BP (!) 171/89     Systolic BP Percentile      Diastolic BP Percentile      Pulse Rate (!) 106     Resp 18     Temp      Temp src      SpO2 97 %     Weight      Height      Head Circumference      Peak Flow      Pain Score      Pain Loc      Pain Education      Exclude from Growth Chart     Most recent vital signs: Vitals:   08/21/23 0017 08/21/23 0111  BP: (!) 141/93 138/83  Pulse: 97 90  Resp: 18 18  Temp: 98.4 F (36.9 C) 98 F (36.7 C)  SpO2: 97% 98%    General: Awake and alert but patient appears to be actively seizing with focal seizure of the right side of his body.   Able answer questions but does so with difficulty due to aphasia and dysarthria and seizure-like activity. CV:  Good peripheral perfusion.  Mild tachycardia.  Regular rhythm. Resp:  Normal effort with mild tachypnea due to seizure-like activity. no accessory muscle usage nor intercostal retractions.  Lungs clear to auscultation. Abd:  No distention.  No tenderness to palpation. Other:  Repetitive seizure activity primarily throughout the right side of his body with some contractures of his right arm, winking and convulsing of his facial muscles.  He is clearly cognizant of what is going on and is trying to answer questions but having difficulty articulating his answers apparently due to muscle control rather than word finding difficulties.  He is able to get a few answers out.  NIH Stroke Scale  Interval: Baseline Time: ~2310 Person Administering Scale: Loleta Rose  Administer stroke scale items in the order listed. Record performance in each category after each subscale exam. Do not go back and change scores. Follow directions provided for each exam technique.  Scores should reflect what the patient does, not what the clinician thinks the patient can do. The clinician should record answers while administering the exam and work quickly. Except where indicated, the patient should not be coached (i.e., repeated requests to patient to make a special effort).   1a  Level of consciousness: 0=alert; keenly responsive  1b. LOC questions:  0=Performs both tasks correctly  1c. LOC commands: 0=Performs both tasks correctly  2.  Best Gaze: 0=normal  3.  Visual: 0=No visual loss  4. Facial Palsy: 2=Partial paralysis (total or near total paralysis of the lower face)  5a.  Motor left arm: 0=No drift, limb holds 90 (or 45) degrees for full 10 seconds  5b.  Motor right arm: 4=No movement  6a. motor left leg: 0=No drift, limb holds 90 (or 45) degrees for full 10 seconds  6b  Motor right leg:  1=Drift, limb  holds 90 (or 45) degrees but drifts down before full 10 seconds: does not hit bed  7. Limb Ataxia: 0=Absent  8.  Sensory: 1=Mild to moderate sensory loss; patient feels pinprick is less sharp or is dull on the affected side; there is a loss of superficial pain with pinprick but patient is aware He is being touched  9. Best Language:  1=Mild to moderate aphasia; some obvious loss of fluency or facility of comprehension without significant limitation on ideas expressed or form of expression.  10. Dysarthria: 1=Mild to moderate, patient slurs at least some words and at worst, can be understood with some difficulty  11. Extinction and Inattention: 0=No abnormality  12. Distal motor function: 0=Normal   Total:   10      ED Results / Procedures / Treatments   Labs (all labs ordered are listed, but only abnormal results are displayed) Labs Reviewed  CBC - Abnormal; Notable for the following components:      Result Value   RDW 16.4 (*)    All other components within normal limits  COMPREHENSIVE METABOLIC PANEL - Abnormal; Notable for the following components:   Glucose, Bld 136 (*)    Anion gap 16 (*)    All other components within normal limits  CBG MONITORING, ED - Abnormal; Notable for the following components:   Glucose-Capillary 143 (*)    All other components within normal limits  PROTIME-INR  APTT  DIFFERENTIAL  ETHANOL  URINE DRUG SCREEN, QUALITATIVE ([facility] ONLY)     EKG  ED ECG REPORT I, Loleta Rose, the attending physician, personally viewed and interpreted this ECG.  Date: 08/21/2023 EKG Time: 00: 02 Rate: 96 Rhythm: normal sinus rhythm QRS Axis: normal Intervals: normal ST/T Wave abnormalities: normal Narrative Interpretation: no evidence of acute ischemia    RADIOLOGY CT head code stroke -see hospital course for details   PROCEDURES:  Critical Care performed: Yes, see critical care procedure note(s)  .Critical Care  Performed by: Loleta Rose,  MD Authorized by: Loleta Rose, MD   Critical care provider statement:    Critical care time (minutes):  60   Critical care time was exclusive of:  Separately billable procedures and treating other patients   Critical care was necessary to treat or prevent imminent or life-threatening deterioration of the following conditions:  CNS failure or compromise   Critical care was time spent personally by me on the following activities:  Development of treatment plan with patient or surrogate, evaluation of patient's response to treatment, examination of patient, obtaining history from patient or surrogate, ordering and performing treatments  and interventions, ordering and review of laboratory studies, ordering and review of radiographic studies, pulse oximetry, re-evaluation of patient's condition and review of old charts .1-3 Lead EKG Interpretation  Performed by: Loleta Rose, MD Authorized by: Loleta Rose, MD     Interpretation: abnormal     ECG rate:  105   ECG rate assessment: tachycardic     Rhythm: sinus tachycardia     Ectopy: none     Conduction: normal       IMPRESSION / MDM / ASSESSMENT AND PLAN / ED COURSE  I reviewed the triage vital signs and the nursing notes.                              Differential diagnosis includes, but is not limited to, seizure, intracranial hemorrhage, worsening tumor burden, worsening vasogenic edema, electrolyte or metabolic abnormality  Patient's presentation is most consistent with acute presentation with potential threat to life or bodily function.  Labs/studies ordered: As per stroke protocol, the following labs were ordered: CMP, ethanol level, urine drug screen, pro time-INR, APTT, CBC, differential.  CT head code stroke also ordered.  Interventions/Medications given:  Medications  levETIRAcetam (KEPPRA) IVPB 1000 mg/100 mL premix (0 mg Intravenous Stopped 08/21/23 0000)  dexamethasone (DECADRON) injection 10 mg (10 mg Intravenous Given  08/20/23 2332)  levETIRAcetam (KEPPRA) IVPB 1000 mg/100 mL premix (0 mg Intravenous Stopped 08/21/23 0026)    Followed by  levETIRAcetam (KEPPRA) IVPB 1000 mg/100 mL premix (0 mg Intravenous Stopped 08/21/23 0111)    Followed by  levETIRAcetam (KEPPRA) IVPB 1500 mg/ 100 mL premix (0 mg Intravenous Stopped 08/21/23 0102)  LORazepam (ATIVAN) injection 2 mg (2 mg Intravenous Given 08/20/23 2340)  LORazepam (ATIVAN) injection 1 mg (1 mg Intravenous Given 08/21/23 0000)  lacosamide (VIMPAT) 200 mg in sodium chloride 0.9 % 25 mL IVPB (200 mg Intravenous Transfusing/Transfer 08/21/23 0127)    (Note:  hospital course my include additional interventions and/or labs/studies not listed above.)   Patient is hypertensive but also actively seizing.  Patient sent to CT to evaluate for acute intracranial bleed which would definitely change the management of the case.  Will start antiepileptic medications as soon as he gets back.  Patient is not a candidate for TNKase given his metastatic disease in his brain and the risk of hemorrhage.  Once seizure activity is controlled I will look at blood pressure control as well  The patient is on the cardiac monitor to evaluate for evidence of arrhythmia and/or significant heart rate changes.   Clinical Course as of 08/21/23 0206  Thu Aug 20, 2023  2325 Radiologist called me to let me know that there is motion artifact which limits the ability to evaluate the study, but it appears that the vasogenic edema and mass effect is worse than on prior studies. [CF]  2326 I evaluated the patient again at bedside and continues to have focal seizures.  I strongly doubt CVA and do not feel he is a candidate for TNKase but I will continue with the stroke/neurology assessment through teleneurology.  The connection with the car is active and they are currently awaiting the arrival of the neurologist.  However, given the ongoing seizure-like activity I do not want to wait on treatment and I  have ordered Keppra 1 g IV and Decadron 10 mg IV.  I also consulted with the pharmacist Barbara Cower and we discussed the maximum recommended dose of 4.5 g IV of  Keppra, so he is putting in an order for an additional 3.5 g IV which I think is very appropriate for the patient under the circumstances. [CF]  2340 Also ordered Ativan 2 mg IV [CF]  2355 Consulted with the neurologist who is evaluating patient.  She agrees that this is not a CVA, agrees with the plan for a total of 4.5 g of Keppra followed by fosphenytoin or other agent if necessary if the patient continues to exhibit seizure-like activity.  His seizures have improved since the Ativan but he is still having some focal seizures in the right side of his face particularly noticeable around his eye.  He is also still having trouble with dysarthria.  Given the patient's need for a higher level of neuro ICU care and continuous EEG monitoring which are not available at this hospital, I have asked my ED secretary to reach out to [facility] to discuss transfer to [facility]. [CF]  Fri Aug 21, 2023  0000 CBC and CMP and coagulation studies are all reassuring.  Urine drug screen and ethanol level not yet collected but this is much less relevant.  No sign of infectious disease at this point.  Awaiting callback from [facility]. [CF]  0018 [facility] called back but with neurosurgery.  I explained that I do not need to speak with neurosurgery, I need to speak with neurology or the neuro ICU.  I discussed the case at length with Charlie with [facility] and she is going to investigate and get someone in touch with me. [CF]  0030 Consulted with Dr. Derry Lory, neurologist on call at [facility].  Discussed in detail.  He recommended moving forward with Vimpat 200 mg IV now in addition to the Keppra loading dose.   He agrees patient needs to be at [facility].  Unfortunately, Charlie with [facility] confirmed there are no stepdown beds available. Given that the patient will need to be at  [facility] regardless, the plan now is to transfer ED to ED so the patient can be evaluated by Neuro and EEG monitoring can start.  Awaiting phone discussion with EDP. [CF]  [id number] Patient accepted by Dr. Nicanor Alcon [facility] ED).  Awaiting ambulance for transportation. [CF]  0130 I finished my reassessment of the patient within about 2 minutes of care like arriving and was able to give them bedside report about the patient.  The patient is no longer having any seizure-like activity around his eye or his arm.  However he is still having a great deal of difficulty verbalizing anything, catching on words, expressing both dysarthria and possible expressive aphasia.  Patient is protecting his airway and is awake and alert and able to follow commands.  No indication for intubation.  Very appropriate at this time for ALS transfer to [facility] for ongoing neuroevaluation. [CF]    Clinical Course User Index [CF] Loleta Rose, MD     FINAL CLINICAL IMPRESSION(S) / ED DIAGNOSES   Final diagnoses:  New onset seizure (HCC)  Malignant neoplasm metastatic to brain [facility])  Vasogenic cerebral edema (HCC)  Neoplasm causing mass effect and brain compression on adjacent structures [facility])     Rx / DC Orders   ED Discharge Orders     None        Note:  This document was prepared using Dragon voice recognition software and may include unintentional dictation errors.   Loleta Rose, MD 08/21/23 [phone]

## 2023-08-21 ENCOUNTER — Other Ambulatory Visit: Payer: Self-pay

## 2023-08-21 ENCOUNTER — Emergency Department (HOSPITAL_COMMUNITY): Payer: HMO

## 2023-08-21 ENCOUNTER — Encounter (HOSPITAL_COMMUNITY): Payer: Self-pay | Admitting: Emergency Medicine

## 2023-08-21 ENCOUNTER — Inpatient Hospital Stay (HOSPITAL_COMMUNITY): Payer: HMO

## 2023-08-21 ENCOUNTER — Inpatient Hospital Stay (HOSPITAL_COMMUNITY)
Admission: EM | Admit: 2023-08-21 | Discharge: 2023-08-26 | DRG: 054 | Disposition: A | Discharge status: Home Health | Payer: HMO | Source: Other Acute Inpatient Hospital | Attending: Internal Medicine | Admitting: Internal Medicine

## 2023-08-21 ENCOUNTER — Encounter (HOSPITAL_COMMUNITY): Payer: Self-pay

## 2023-08-21 DIAGNOSIS — Z8249 Family history of ischemic heart disease and other diseases of the circulatory system: Secondary | ICD-10-CM

## 2023-08-21 DIAGNOSIS — E782 Mixed hyperlipidemia: Secondary | ICD-10-CM | POA: Diagnosis not present

## 2023-08-21 DIAGNOSIS — E785 Hyperlipidemia, unspecified: Secondary | ICD-10-CM | POA: Diagnosis present

## 2023-08-21 DIAGNOSIS — Z981 Arthrodesis status: Secondary | ICD-10-CM

## 2023-08-21 DIAGNOSIS — Z7982 Long term (current) use of aspirin: Secondary | ICD-10-CM

## 2023-08-21 DIAGNOSIS — E870 Hyperosmolality and hypernatremia: Secondary | ICD-10-CM | POA: Diagnosis not present

## 2023-08-21 DIAGNOSIS — Z833 Family history of diabetes mellitus: Secondary | ICD-10-CM | POA: Diagnosis not present

## 2023-08-21 DIAGNOSIS — G935 Compression of brain: Secondary | ICD-10-CM | POA: Diagnosis not present

## 2023-08-21 DIAGNOSIS — G4730 Sleep apnea, unspecified: Secondary | ICD-10-CM | POA: Diagnosis not present

## 2023-08-21 DIAGNOSIS — J449 Chronic obstructive pulmonary disease, unspecified: Secondary | ICD-10-CM | POA: Diagnosis present

## 2023-08-21 DIAGNOSIS — C3490 Malignant neoplasm of unspecified part of unspecified bronchus or lung: Secondary | ICD-10-CM | POA: Diagnosis not present

## 2023-08-21 DIAGNOSIS — Z87891 Personal history of nicotine dependence: Secondary | ICD-10-CM

## 2023-08-21 DIAGNOSIS — Z79899 Other long term (current) drug therapy: Secondary | ICD-10-CM

## 2023-08-21 DIAGNOSIS — G8191 Hemiplegia, unspecified affecting right dominant side: Secondary | ICD-10-CM | POA: Diagnosis present

## 2023-08-21 DIAGNOSIS — W1839XA Other fall on same level, initial encounter: Secondary | ICD-10-CM | POA: Diagnosis present

## 2023-08-21 DIAGNOSIS — E032 Hypothyroidism due to medicaments and other exogenous substances: Secondary | ICD-10-CM | POA: Diagnosis not present

## 2023-08-21 DIAGNOSIS — Z9221 Personal history of antineoplastic chemotherapy: Secondary | ICD-10-CM | POA: Diagnosis not present

## 2023-08-21 DIAGNOSIS — G936 Cerebral edema: Secondary | ICD-10-CM | POA: Diagnosis not present

## 2023-08-21 DIAGNOSIS — D499 Neoplasm of unspecified behavior of unspecified site: Secondary | ICD-10-CM | POA: Diagnosis not present

## 2023-08-21 DIAGNOSIS — I10 Essential (primary) hypertension: Secondary | ICD-10-CM | POA: Diagnosis not present

## 2023-08-21 DIAGNOSIS — Z7902 Long term (current) use of antithrombotics/antiplatelets: Secondary | ICD-10-CM

## 2023-08-21 DIAGNOSIS — R479 Unspecified speech disturbances: Secondary | ICD-10-CM | POA: Diagnosis present

## 2023-08-21 DIAGNOSIS — C7931 Secondary malignant neoplasm of brain: Principal | ICD-10-CM | POA: Diagnosis present

## 2023-08-21 DIAGNOSIS — C3491 Malignant neoplasm of unspecified part of right bronchus or lung: Secondary | ICD-10-CM | POA: Diagnosis not present

## 2023-08-21 DIAGNOSIS — R4701 Aphasia: Secondary | ICD-10-CM | POA: Diagnosis present

## 2023-08-21 DIAGNOSIS — Z923 Personal history of irradiation: Secondary | ICD-10-CM | POA: Diagnosis not present

## 2023-08-21 DIAGNOSIS — I739 Peripheral vascular disease, unspecified: Secondary | ICD-10-CM | POA: Diagnosis present

## 2023-08-21 DIAGNOSIS — G40901 Epilepsy, unspecified, not intractable, with status epilepticus: Secondary | ICD-10-CM | POA: Diagnosis not present

## 2023-08-21 DIAGNOSIS — R569 Unspecified convulsions: Principal | ICD-10-CM

## 2023-08-21 DIAGNOSIS — Z79890 Hormone replacement therapy (postmenopausal): Secondary | ICD-10-CM | POA: Diagnosis not present

## 2023-08-21 LAB — CBC
HCT: 39.9 % (ref 39.0–52.0)
Hemoglobin: 13 g/dL (ref 13.0–17.0)
MCH: 27.3 pg (ref 26.0–34.0)
MCHC: 32.6 g/dL (ref 30.0–36.0)
MCV: 83.6 fL (ref 80.0–100.0)
Platelets: 198 10*3/uL (ref 150–400)
RBC: 4.77 MIL/uL (ref 4.22–5.81)
RDW: 16.1 % — ABNORMAL HIGH (ref 11.5–15.5)
WBC: 10 10*3/uL (ref 4.0–10.5)
nRBC: 0 % (ref 0.0–0.2)

## 2023-08-21 LAB — BASIC METABOLIC PANEL
Anion gap: 11 (ref 5–15)
BUN: 18 mg/dL (ref 8–23)
CO2: 23 mmol/L (ref 22–32)
Calcium: 9 mg/dL (ref 8.9–10.3)
Chloride: 108 mmol/L (ref 98–111)
Creatinine, Ser: 0.97 mg/dL (ref 0.61–1.24)
GFR, Estimated: >60 mL/min (ref >60)
Glucose, Bld: 121 mg/dL — ABNORMAL HIGH (ref 70–99)
Potassium: 3.9 mmol/L (ref 3.5–5.1)
Sodium: 142 mmol/L (ref 135–145)

## 2023-08-21 LAB — HIV ANTIBODY (ROUTINE TESTING W REFLEX): HIV Screen 4th Generation wRfx: NONREACTIVE

## 2023-08-21 LAB — MAGNESIUM: Magnesium: 2.2 mg/dL (ref 1.7–2.4)

## 2023-08-21 LAB — CBG MONITORING, ED: Glucose-Capillary: 143 mg/dL — ABNORMAL HIGH (ref 70–99)

## 2023-08-21 MED ORDER — LEVETIRACETAM IN NACL 1000 MG/100ML IV SOLN
1000.0000 mg | Freq: Two times a day (BID) | INTRAVENOUS | Status: DC
  Administered 2023-08-21 (×2): 1000 mg via INTRAVENOUS
  Filled 2023-08-21 (×2): qty 100

## 2023-08-21 MED ORDER — ONDANSETRON HCL 4 MG PO TABS: 4.0000 mg | ORAL_TABLET | Freq: Four times a day (QID) | ORAL | Status: DC | PRN

## 2023-08-21 MED ORDER — ACETAMINOPHEN 650 MG RE SUPP: 650.0000 mg | RECTAL | Status: DC | PRN

## 2023-08-21 MED ORDER — GADOBUTROL 1 MMOL/ML IV SOLN
10.0000 mL | Freq: Once | INTRAVENOUS | Status: AC | PRN
  Administered 2023-08-21: 10 mL via INTRAVENOUS

## 2023-08-21 MED ORDER — SODIUM CHLORIDE 0.9 % IV SOLN
200.0000 mg | INTRAVENOUS | Status: AC
  Administered 2023-08-21: 200 mg via INTRAVENOUS
  Filled 2023-08-21: qty 20

## 2023-08-21 MED ORDER — ONDANSETRON HCL 4 MG/2ML IJ SOLN: 4.0000 mg | Freq: Four times a day (QID) | INTRAMUSCULAR | Status: DC | PRN

## 2023-08-21 MED ORDER — SODIUM CHLORIDE 0.9 % IV SOLN
75.0000 mL/h | INTRAVENOUS | Status: DC
  Administered 2023-08-21: 75 mL/h via INTRAVENOUS

## 2023-08-21 MED ORDER — ACETAMINOPHEN 325 MG PO TABS
650.0000 mg | ORAL_TABLET | ORAL | Status: DC | PRN
  Administered 2023-08-21 – 2023-08-23 (×2): 650 mg via ORAL
  Filled 2023-08-21 (×2): qty 2

## 2023-08-21 MED ORDER — LEVETIRACETAM 500 MG PO TABS
1000.0000 mg | ORAL_TABLET | Freq: Two times a day (BID) | ORAL | Status: DC
  Administered 2023-08-22 – 2023-08-26 (×9): 1000 mg via ORAL
  Filled 2023-08-21 (×11): qty 2

## 2023-08-21 MED ORDER — LORAZEPAM 2 MG/ML IJ SOLN: 1.0000 mg | INTRAMUSCULAR | Status: DC | PRN

## 2023-08-21 MED ORDER — ORAL CARE MOUTH RINSE
15.0000 mL | OROMUCOSAL | Status: DC
  Administered 2023-08-21 – 2023-08-22 (×7): 15 mL via OROMUCOSAL

## 2023-08-21 MED ORDER — ORAL CARE MOUTH RINSE
15.0000 mL | OROMUCOSAL | Status: DC | PRN
Start: 2023-08-21 — End: 2023-08-22

## 2023-08-21 MED ORDER — DEXAMETHASONE SODIUM PHOSPHATE 4 MG/ML IJ SOLN
4.0000 mg | Freq: Two times a day (BID) | INTRAMUSCULAR | Status: DC
  Administered 2023-08-21 – 2023-08-24 (×7): 4 mg via INTRAVENOUS
  Filled 2023-08-21 (×7): qty 1

## 2023-08-21 MED ORDER — ENOXAPARIN SODIUM 40 MG/0.4ML IJ SOSY
40.0000 mg | PREFILLED_SYRINGE | INTRAMUSCULAR | Status: DC
  Administered 2023-08-21 – 2023-08-25 (×5): 40 mg via SUBCUTANEOUS
  Filled 2023-08-21 (×5): qty 0.4

## 2023-08-21 NOTE — H&P (Incomplete)
History and Physical    Patient: Joshua [name] WUJ:[id number] DOB: August 24, 1955 DOA: 08/21/2023 DOS: the patient was seen and examined on 08/21/2023 PCP: Jacky Kindle, FNP (Inactive)  Patient coming from: [facility] transfer ED to ED  Chief Complaint:  Chief Complaint  Patient presents with  . Seizures   HPI: Joshua [name] is a 68 y.o. 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  Patient was last known well at 9:30 PM yesterday evening.  At baseline patient lives alone and cares for himself.     In the emergency department at [facility] patient was noted to be mildly tachycardic with blood pressures initially elevated up to 171/89, and all other vital signs maintained.  Labs from 1/31 significant for mildly elevated anion gap of 16 with CO2 24, and glucose 136.  CT scan of the head noted increasing left frontal parietal edema and mass effect with 9 mm right midline shift likely relating to worsening metastatic disease.  Twitching improved after patient was given Keppra, Ativan, Vimpat, and Decadron.  Patient was transferred to ED for need of continuous EEG monitoring.  Review of Systems: As mentioned in the history of present illness. All other systems reviewed and are negative. Past Medical History:  Diagnosis Date  . Allergy   . Cancer (HCC)    lung  . COPD (chronic obstructive pulmonary disease) (HCC)   . Dyspnea    former smoker. only doe  . History of kidney stones   . Hypertension   . Hypothyroidism due to medication 04/04/2021  . Malignant neoplasm metastatic to brain (HCC)   . Peripheral vascular disease (HCC)   . Pre-diabetes   . Sleep apnea    Past Surgical History:  Procedure Laterality Date  . ANTERIOR CERVICAL DECOMP/DISCECTOMY FUSION N/A 03/14/2020   Procedure: ANTERIOR CERVICAL  DECOMPRESSION/DISCECTOMY FUSION 3 LEVELS C4-7;  Surgeon: Venetia Night, MD;  Location: [facility] ORS;  Service: Neurosurgery;  Laterality: N/A;  . BACK SURGERY  2011  . CENTRAL LINE INSERTION Right 09/10/2016   Procedure: CENTRAL LINE INSERTION;  Surgeon: Renford Dills, MD;  Location: [facility] ORS;  Service: Vascular;  Laterality: Right;  . CERVICAL FUSION     C4-c7 on Mar 14, 2020  . COLONOSCOPY  2013 ?  . COLONOSCOPY N/A 09/09/2021   Procedure: COLONOSCOPY;  Surgeon: Jaynie Collins, DO;  Location: [facility] ENDOSCOPY;  Service: Gastroenterology;  Laterality: N/A;  . CORONARY ANGIOPLASTY    . ESOPHAGOGASTRODUODENOSCOPY N/A 09/09/2021   Procedure: ESOPHAGOGASTRODUODENOSCOPY (EGD);  Surgeon: Jaynie Collins, DO;  Location: [facility] ENDOSCOPY;  Service: Gastroenterology;  Laterality: N/A;  . FEMORAL-POPLITEAL BYPASS GRAFT Right 09/10/2016   Procedure: BYPASS GRAFT FEMORAL-POPLITEAL ARTERY ( BELOW KNEE );  Surgeon: Renford Dills, MD;  Location: [facility] ORS;  Service: Vascular;  Laterality: Right;  . FEMORAL-TIBIAL BYPASS GRAFT Right 03/23/2020   Procedure: BYPASS GRAFT RIGHT FEMORAL- DISTAL TIBIAL ARTERY WITH DISTAL FLOW GRAFT;  Surgeon: Renford Dills, MD;  Location: [facility] ORS;  Service: Vascular;  Laterality: Right;  . LEFT HEART CATH AND CORONARY ANGIOGRAPHY Left 05/16/2021   Procedure: LEFT HEART CATH AND CORONARY ANGIOGRAPHY;  Surgeon: Armando Reichert, MD;  Location: [facility] INVASIVE CV LAB;  Service: Cardiovascular;  Laterality: Left;  . LOWER EXTREMITY ANGIOGRAPHY Right 11/18/2016   Procedure: Lower Extremity Angiography;  Surgeon: Renford Dills, MD;  Location: [facility] INVASIVE CV  LAB;  Service: Cardiovascular;  Laterality: Right;  . LOWER EXTREMITY ANGIOGRAPHY Right 12/09/2016   Procedure: Lower Extremity Angiography;  Surgeon: Renford Dills, MD;  Location: [facility] INVASIVE CV LAB;  Service: Cardiovascular;  Laterality: Right;  . LOWER EXTREMITY ANGIOGRAPHY Right 11/15/2019    Procedure: LOWER EXTREMITY ANGIOGRAPHY;  Surgeon: Renford Dills, MD;  Location: [facility] INVASIVE CV LAB;  Service: Cardiovascular;  Laterality: Right;  . LOWER EXTREMITY ANGIOGRAPHY Right 12/01/2019   Procedure: Lower Extremity Angiography;  Surgeon: Annice Needy, MD;  Location: [facility] INVASIVE CV LAB;  Service: Cardiovascular;  Laterality: Right;  . LOWER EXTREMITY ANGIOGRAPHY Right 12/02/2019   Procedure: LOWER EXTREMITY ANGIOGRAPHY;  Surgeon: Renford Dills, MD;  Location: [facility] INVASIVE CV LAB;  Service: Cardiovascular;  Laterality: Right;  . LOWER EXTREMITY ANGIOGRAPHY Right 03/23/2020   Procedure: Lower Extremity Angiography;  Surgeon: Renford Dills, MD;  Location: [facility] INVASIVE CV LAB;  Service: Cardiovascular;  Laterality: Right;  . LOWER EXTREMITY ANGIOGRAPHY Right 09/25/2021   Procedure: Lower Extremity Angiography;  Surgeon: Renford Dills, MD;  Location: [facility] INVASIVE CV LAB;  Service: Cardiovascular;  Laterality: Right;  . LOWER EXTREMITY ANGIOGRAPHY Right 09/26/2021   Procedure: Lower Extremity Angiography;  Surgeon: Annice Needy, MD;  Location: [facility] INVASIVE CV LAB;  Service: Cardiovascular;  Laterality: Right;  . LOWER EXTREMITY ANGIOGRAPHY Right 04/08/2022   Procedure: Lower Extremity Angiography;  Surgeon: Renford Dills, MD;  Location: [facility] INVASIVE CV LAB;  Service: Cardiovascular;  Laterality: Right;  . LOWER EXTREMITY INTERVENTION  11/18/2016   Procedure: Lower Extremity Intervention;  Surgeon: Renford Dills, MD;  Location: [facility] INVASIVE CV LAB;  Service: Cardiovascular;;  . PERIPHERAL VASCULAR CATHETERIZATION Right 01/02/2015   Procedure: Lower Extremity Angiography;  Surgeon: Renford Dills, MD;  Location: [facility] INVASIVE CV LAB;  Service: Cardiovascular;  Laterality: Right;  . PERIPHERAL VASCULAR CATHETERIZATION Right 06/18/2015   Procedure: Lower Extremity Angiography;  Surgeon: Annice Needy, MD;  Location: [facility] INVASIVE CV LAB;  Service: Cardiovascular;   Laterality: Right;  . PERIPHERAL VASCULAR CATHETERIZATION  06/18/2015   Procedure: Lower Extremity Intervention;  Surgeon: Annice Needy, MD;  Location: [facility] INVASIVE CV LAB;  Service: Cardiovascular;;  . PERIPHERAL VASCULAR CATHETERIZATION N/A 10/09/2015   Procedure: Abdominal Aortogram w/Lower Extremity;  Surgeon: Renford Dills, MD;  Location: [facility] INVASIVE CV LAB;  Service: Cardiovascular;  Laterality: N/A;  . PERIPHERAL VASCULAR CATHETERIZATION  10/09/2015   Procedure: Lower Extremity Intervention;  Surgeon: Renford Dills, MD;  Location: [facility] INVASIVE CV LAB;  Service: Cardiovascular;;  . PERIPHERAL VASCULAR CATHETERIZATION Right 10/10/2015   Procedure: Lower Extremity Angiography;  Surgeon: Annice Needy, MD;  Location: [facility] INVASIVE CV LAB;  Service: Cardiovascular;  Laterality: Right;  . PERIPHERAL VASCULAR CATHETERIZATION Right 10/10/2015   Procedure: Lower Extremity Intervention;  Surgeon: Annice Needy, MD;  Location: [facility] INVASIVE CV LAB;  Service: Cardiovascular;  Laterality: Right;  . PERIPHERAL VASCULAR CATHETERIZATION Right 12/03/2015   Procedure: Lower Extremity Angiography;  Surgeon: Annice Needy, MD;  Location: [facility] INVASIVE CV LAB;  Service: Cardiovascular;  Laterality: Right;  . PERIPHERAL VASCULAR CATHETERIZATION Right 12/04/2015   Procedure: Lower Extremity Angiography;  Surgeon: Annice Needy, MD;  Location: [facility] INVASIVE CV LAB;  Service: Cardiovascular;  Laterality: Right;  . PERIPHERAL VASCULAR CATHETERIZATION  12/04/2015   Procedure: Lower Extremity Intervention;  Surgeon: Annice Needy, MD;  Location: [facility] INVASIVE CV LAB;  Service: Cardiovascular;;  . PERIPHERAL VASCULAR CATHETERIZATION Right 06/30/2016   Procedure: Lower Extremity Angiography;  Surgeon: Annice Needy, MD;  Location: [facility] INVASIVE CV LAB;  Service: Cardiovascular;  Laterality: Right;  . PERIPHERAL VASCULAR CATHETERIZATION Right 06/25/2016   Procedure: Lower Extremity Angiography;  Surgeon: Renford Dills, MD;   Location: [facility] INVASIVE CV LAB;  Service: Cardiovascular;  Laterality: Right;  . PERIPHERAL VASCULAR CATHETERIZATION Right 07/01/2016   Procedure: Lower Extremity Angiography;  Surgeon: Renford Dills, MD;  Location: [facility] INVASIVE CV LAB;  Service: Cardiovascular;  Laterality: Right;  . PERIPHERAL VASCULAR CATHETERIZATION  07/01/2016   Procedure: Lower Extremity Intervention;  Surgeon: Renford Dills, MD;  Location: [facility] INVASIVE CV LAB;  Service: Cardiovascular;;  . PERIPHERAL VASCULAR CATHETERIZATION Right 08/01/2016   Procedure: Lower Extremity Angiography;  Surgeon: Renford Dills, MD;  Location: [facility] INVASIVE CV LAB;  Service: Cardiovascular;  Laterality: Right;  . PORTA CATH INSERTION N/A 10/05/2020   Procedure: PORTA CATH INSERTION;  Surgeon: Renford Dills, MD;  Location: [facility] INVASIVE CV LAB;  Service: Cardiovascular;  Laterality: N/A;  . stent placement in right leg Right   . VIDEO BRONCHOSCOPY N/A 09/14/2020   Procedure: VIDEO BRONCHOSCOPY WITH FLUORO;  Surgeon: Salena Saner, MD;  Location: [facility] ORS;  Service: Cardiopulmonary;  Laterality: N/A;  . VIDEO BRONCHOSCOPY WITH ENDOBRONCHIAL ULTRASOUND N/A 09/14/2020   Procedure: VIDEO BRONCHOSCOPY WITH ENDOBRONCHIAL ULTRASOUND;  Surgeon: Salena Saner, MD;  Location: [facility] ORS;  Service: Cardiopulmonary;  Laterality: N/A;   Social History:  reports that he quit smoking about 8 years ago. His smoking use included cigarettes. He started smoking about 46 years ago. He has a 76 pack-year smoking history. He has never used smokeless tobacco. He reports that he does not currently use alcohol. He reports that he does not use drugs.  No Known Allergies  Family History  Problem Relation Age of Onset  . Diabetes Mother   . Hypertension Mother   . Heart murmur Mother   . Leukemia Mother   . Throat cancer Maternal Grandmother   . Colon cancer Neg Hx   . Breast cancer Neg Hx     Prior to Admission medications   Medication  Sig Start Date End Date Taking? Authorizing Provider  aspirin EC 81 MG tablet Take 81 mg by mouth daily. Swallow whole.    [provider]  clopidogrel (PLAVIX) 75 MG tablet TAKE 1 TABLET BY MOUTH DAILY 05/11/23   Georgiana Spinner, NP  dexamethasone (DECADRON) 4 MG tablet Take 0.5 tablets (2 mg total) by mouth daily. 08/18/23   Henreitta Leber, MD  levothyroxine (SYNTHROID) 125 MCG tablet Take 1 tablet (125 mcg total) by mouth daily before breakfast. 08/05/23   [name], MD  rosuvastatin (CRESTOR) 40 MG tablet Take 1 tablet (40 mg total) by mouth daily. 06/03/23   Jacky Kindle, FNP  Vitamin D, Ergocalciferol, (DRISDOL) 1.25 MG (50000 UNIT) CAPS capsule Take 1 capsule (50,000 Units total) by mouth every 7 (seven) days. 06/03/23   Jacky Kindle, FNP    Physical Exam: Vitals:   08/21/23 0230 08/21/23 0345 08/21/23 0545 08/21/23 0600  BP: 125/74  127/76 130/76  Pulse: 83 78 81 86  Resp: 17 16 17 14   Temp:    97.8 F (36.6 C)  TempSrc:    Oral  SpO2: 95% 98% 92% 94%   *** Data Reviewed: {Tip this will not be part of the note when signed- Document your independent interpretation of telemetry tracing, EKG, lab, Radiology test or any other diagnostic tests. Add any new diagnostic test ordered  today. (Optional):26781}   Assessment and Plan:  Seizure Cerebral edema with mass effect  -Admit to a progressive bed -Neurochecks -Continuous EEG monitoring   -Decadron increased to 4 mg IV every 8 hours -MRI of the brain brain with and without contrast -Continue Keppra 1000 mg twice daily -Appreciate neurology consultative services we will follow-up for any further recommendations    DVT prophylaxis: Lovenox  Advance Care Planning:   Code Status: Prior ***  Consults: ***  Family Communication: ***  Severity of Illness: {Observation/Inpatient:21159}  Author: Clydie Braun, MD 08/21/2023 7:32 AM  For on call review [email or web address].

## 2023-08-21 NOTE — ED Notes (Signed)
23:55 Called [facility] spoke with Tammie/ Request call back from Neuro ICU

## 2023-08-21 NOTE — Progress Notes (Signed)
LTM EEG running - no initial skin breakdown - push button tested - neuro notified.  

## 2023-08-21 NOTE — ED Notes (Signed)
The pts room is ready bu the rn needs to read over his chart

## 2023-08-21 NOTE — ED Notes (Signed)
Called mindy, pt's daughter w/ pt permission. She was updated and will be called by this RN once pt is transferred to [place].

## 2023-08-21 NOTE — ED Notes (Signed)
 Patient transported to MRI

## 2023-08-21 NOTE — ED Provider Notes (Signed)
[place] EMERGENCY DEPARTMENT AT [facility] Provider Note   CSN: [id number] Arrival date & time: 08/21/23  0155     History  Chief Complaint  Patient presents with   Seizures    Joshua [name] is a 68 y.o. male.  The history is provided by the patient and medical records.  Seizures  68 year old male with history of squamous cell carcinoma of lung with mets to brain, presenting to the ED from [facility] due to seizures.  Patient currently undergoing chemotherapy and immunotherapy with Dr. Cathie Hoops.  Presented to the ED at [facility] with concern of possible stroke with right sided weakness and facial droop but also some questionable seizure activity.  CT head performed which had some motion artifact but was concerning for increased left frontoparietal edema with mass effect and 9 mm of right midline shift, seems to be worsening metastatic disease.  He was transferred here for continuous EEG and repeat MRI.  No prior hx of seizure disorder, not currently on any antiepileptics.  He was given keppra and ativan at [facility] prior to transfer.  Home Medications Prior to Admission medications   Medication Sig Start Date End Date Taking? Authorizing Provider  aspirin EC 81 MG tablet Take 81 mg by mouth daily. Swallow whole.    [provider]  clopidogrel (PLAVIX) 75 MG tablet TAKE 1 TABLET BY MOUTH DAILY 05/11/23   Georgiana Spinner, NP  dexamethasone (DECADRON) 4 MG tablet Take 0.5 tablets (2 mg total) by mouth daily. 08/18/23   Henreitta Leber, MD  levothyroxine (SYNTHROID) 125 MCG tablet Take 1 tablet (125 mcg total) by mouth daily before breakfast. 08/05/23   [name], MD  rosuvastatin (CRESTOR) 40 MG tablet Take 1 tablet (40 mg total) by mouth daily. 06/03/23   Jacky Kindle, FNP  Vitamin D, Ergocalciferol, (DRISDOL) 1.25 MG (50000 UNIT) CAPS capsule Take 1 capsule (50,000 Units total) by mouth every 7 (seven) days. 06/03/23   Jacky Kindle, FNP      Allergies    Patient has no  known allergies.    Review of Systems   Review of Systems  Neurological:  Positive for seizures.  All other systems reviewed and are negative.   Physical Exam Updated Vital Signs BP (!) 167/74   Pulse 88   Temp 97.9 F (36.6 C) (Oral)   Resp 20   SpO2 100%   Physical Exam Vitals and nursing note reviewed.  Constitutional:      Appearance: He is well-developed.  HENT:     Head: Normocephalic and atraumatic.  Eyes:     Conjunctiva/sclera: Conjunctivae normal.     Pupils: Pupils are equal, round, and reactive to light.  Cardiovascular:     Rate and Rhythm: Normal rate and regular rhythm.     Heart sounds: Normal heart sounds.  Pulmonary:     Effort: Pulmonary effort is normal.     Breath sounds: Normal breath sounds.  Abdominal:     General: Bowel sounds are normal.     Palpations: Abdomen is soft.  Musculoskeletal:        General: Normal range of motion.     Cervical back: Normal range of motion.  Skin:    General: Skin is warm and dry.  Neurological:     Comments: Sleeping, does open eyes to voice but not really talking much at present, able to follow commands when prompted     ED Results / Procedures / Treatments   Labs (all  labs ordered are listed, but only abnormal results are displayed) Labs Reviewed - No data to display  Results for orders placed or performed during the hospital encounter of 08/20/23  CBG monitoring, ED   Collection Time: 08/20/23 10:53 PM  Result Value Ref Range   Glucose-Capillary 143 (H) 70 - 99 mg/dL  CBC   Collection Time: 08/20/23 10:55 PM  Result Value Ref Range   WBC 6.9 4.0 - 10.5 K/uL   RBC 4.97 4.22 - 5.81 MIL/uL   Hemoglobin 13.5 13.0 - 17.0 g/dL   HCT 84.1 32.4 - 40.1 %   MCV 85.7 80.0 - 100.0 fL   MCH 27.2 26.0 - 34.0 pg   MCHC 31.7 30.0 - 36.0 g/dL   RDW 02.7 (H) 25.3 - 66.4 %   Platelets 225 150 - 400 K/uL   nRBC 0.0 0.0 - 0.2 %  Comprehensive metabolic panel   Collection Time: 08/20/23 10:55 PM  Result Value  Ref Range   Sodium 143 135 - 145 mmol/L   Potassium 4.4 3.5 - 5.1 mmol/L   Chloride 103 98 - 111 mmol/L   CO2 24 22 - 32 mmol/L   Glucose, Bld 136 (H) 70 - 99 mg/dL   BUN 18 8 - 23 mg/dL   Creatinine, Ser 4.03 0.61 - 1.24 mg/dL   Calcium 9.4 8.9 - 47.4 mg/dL   Total Protein 7.6 6.5 - 8.1 g/dL   Albumin 4.0 3.5 - 5.0 g/dL   AST 28 15 - 41 U/L   ALT 26 0 - 44 U/L   Alkaline Phosphatase 68 38 - 126 U/L   Total Bilirubin 0.6 0.0 - 1.2 mg/dL   GFR, Estimated >25 >95 mL/min   Anion gap 16 (H) 5 - 15  Protime-INR   Collection Time: 08/20/23 10:55 PM  Result Value Ref Range   Prothrombin Time 13.0 11.4 - 15.2 seconds   INR 1.0 0.8 - 1.2  APTT   Collection Time: 08/20/23 10:55 PM  Result Value Ref Range   aPTT 28 24 - 36 seconds  Differential   Collection Time: 08/20/23 10:55 PM  Result Value Ref Range   Neutrophils Relative % 84 %   Neutro Abs 5.7 1.7 - 7.7 K/uL   Lymphocytes Relative 13 %   Lymphs Abs 0.9 0.7 - 4.0 K/uL   Monocytes Relative 2 %   Monocytes Absolute 0.1 0.1 - 1.0 K/uL   Eosinophils Relative 0 %   Eosinophils Absolute 0.0 0.0 - 0.5 K/uL   Basophils Relative 0 %   Basophils Absolute 0.0 0.0 - 0.1 K/uL   Immature Granulocytes 1 %   Abs Immature Granulocytes 0.04 0.00 - 0.07 K/uL   *Note: Due to a large number of results and/or encounters for the requested time period, some results have not been displayed. A complete set of results can be found in Results Review.   CT HEAD CODE STROKE WO CONTRAST Result Date: 08/20/2023 CLINICAL DATA:  Code stroke.  Neuro deficit, acute, stroke suspected EXAM: CT HEAD WITHOUT CONTRAST TECHNIQUE: Contiguous axial images were obtained from the base of the skull through the vertex without intravenous contrast. RADIATION DOSE REDUCTION: This exam was performed according to the departmental dose-optimization program which includes automated exposure control, adjustment of the mA and/or kV according to patient size and/or use of iterative  reconstruction technique. COMPARISON:  None Available. FINDINGS: Motion limited study.  Within this limitation: Brain: Increasing edema in the left frontoparietal region. No definite new acute hemorrhage. Small focus  of hyperdensity in the left frontal region likely represents mineralization or hemorrhage with in the known metastasis, likely present on prior MRI. No obvious superimposed acute large vascular territory infarct. No hydrocephalus. Vascular: Not well assessed. Skull: No acute fracture. Sinuses/Orbits: Clear sinuses.  No acute orbital findings. Other: No mastoid effusions. IMPRESSION: Increasing left frontoparietal edema and mass effect with 9 mm of right midline shift, likely related to worsening metastatic disease given findings on recent MRI head. Repeat MRI head with contrast could confirm and better assess if clinically warranted. Code stroke imaging results were communicated on 08/20/2023 at 11:15 pm to provider [facility] via telephone, who verbally acknowledged these results. Electronically Signed   By: Feliberto Harts M.D.   On: 08/20/2023 23:20   MR BRAIN W WO CONTRAST Result Date: 08/05/2023 CLINICAL DATA:  Metastatic lung cancer, restaging EXAM: MRI HEAD WITHOUT AND WITH CONTRAST TECHNIQUE: Multiplanar, multiecho pulse sequences of the brain and surrounding structures were obtained without and with intravenous contrast. CONTRAST:  9mL GADAVIST GADOBUTROL 1 MMOL/ML IV SOLN COMPARISON:  07/03/2023 FINDINGS: Brain: Redemonstrated peripherally enhancing mass in the medial left postcentral gyrus, which measures up to 2.4 x 2.2 x 1.8 cm (AP x TR x CC) (series 18, image 26 and series 16, image 143), previously 2.0 x 2.1 x 1.8 cm when remeasured similarly. Increased associated T2 hyperintense signal (series 9, image 59). Enhancing mass in the left precentral gyrus measures 1.6 x 1.3 x 1.7 cm (AP x TR x CC) (series 16, image 112 and series 18, image 32), previously 1.5 x 1.3 x 1.8 cm, overall  unchanged. Unchanged surrounding T2 hyperintense signal. Minimal peripheral enhancement about the lesion in the right occipital lobe, which measures up to 0.9 x 0.8 x 0.6 cm (AP x TR x CC) (series 16, image 62 and series 18, image 12), previously 1.0 x 1.1 x 0.8 cm. Decreased surrounding T2 hyperintense signal. Diminishing enhancement in a previously noted left cerebellar lesion (series 16, image 43). No new abnormal enhancement. No acute infarct, hemorrhage, or midline shift. No hydrocephalus or extra-axial collection. Vascular: Normal arterial flow voids. Normal arterial and venous enhancement. Skull and upper cervical spine: Normal marrow signal. Sinuses/Orbits: Clear paranasal sinuses. No acute finding in the orbits. Other: The mastoid air cells are well aerated. IMPRESSION: 1. Slight interval increase in size of the peripherally enhancing mass in the medial left postcentral gyrus, with increased associated T2 hyperintense signal. 2. Unchanged size of the enhancing mass in the left precentral gyrus. 3. Decreased size of the enhancing lesion in the right occipital lobe, with decreased surrounding T2 hyperintense signal. 4. Diminishing enhancement in a previously noted left cerebellar lesion. 5. No new abnormal enhancement. Electronically Signed   By: Wiliam Ke M.D.   On: 08/05/2023 14:50     EKG None  Radiology CT HEAD CODE STROKE WO CONTRAST Result Date: 08/20/2023 CLINICAL DATA:  Code stroke.  Neuro deficit, acute, stroke suspected EXAM: CT HEAD WITHOUT CONTRAST TECHNIQUE: Contiguous axial images were obtained from the base of the skull through the vertex without intravenous contrast. RADIATION DOSE REDUCTION: This exam was performed according to the departmental dose-optimization program which includes automated exposure control, adjustment of the mA and/or kV according to patient size and/or use of iterative reconstruction technique. COMPARISON:  None Available. FINDINGS: Motion limited study.   Within this limitation: Brain: Increasing edema in the left frontoparietal region. No definite new acute hemorrhage. Small focus of hyperdensity in the left frontal region likely represents mineralization or hemorrhage with  in the known metastasis, likely present on prior MRI. No obvious superimposed acute large vascular territory infarct. No hydrocephalus. Vascular: Not well assessed. Skull: No acute fracture. Sinuses/Orbits: Clear sinuses.  No acute orbital findings. Other: No mastoid effusions. IMPRESSION: Increasing left frontoparietal edema and mass effect with 9 mm of right midline shift, likely related to worsening metastatic disease given findings on recent MRI head. Repeat MRI head with contrast could confirm and better assess if clinically warranted. Code stroke imaging results were communicated on 08/20/2023 at 11:15 pm to provider [facility] via telephone, who verbally acknowledged these results. Electronically Signed   By: Feliberto Harts M.D.   On: 08/20/2023 23:20    Procedures Procedures    Medications Ordered in ED Medications - No data to display  ED Course/ Medical Decision Making/ A&P                                 Medical Decision Making Risk Decision regarding hospitalization.  68 year old male transferred here from [facility] for possible new seizures.  Has known history of metastatic squamous cell lung cancer, mets to brain.  Currently being followed by radiation oncology, Dr. Barbaraann Cao.  CT at sister facility with findings of worsening frontoparietal edema and mass effect with 9 mm of right midline shift.  He is already on Decadron.  Labs without leukocytosis or significant electrolyte derangement.  Ethanol and UDS are pending.  Has already been given ativan and loaded with IV keppra.  Neurology, Dr. Derry Lory, has evaluated.  Plan for continuous EEG.  Neurology will follow along.  Medical admission.  Discussed with hospitalist, Dr. Lazarus Salines-- will admit for ongoing care.  Final  Clinical Impression(s) / ED Diagnoses Final diagnoses:  Seizure [facility])    Rx / DC Orders ED Discharge Orders     None         Garlon Hatchet, PA-C 08/21/23 0432    Nicanor Alcon, April, MD 08/21/23 [phone]

## 2023-08-21 NOTE — ED Notes (Signed)
The pt is still on the eeg alert talkiing laughing

## 2023-08-21 NOTE — Consult Note (Addendum)
[facility] Tele[facility]rology Consult Services   Patient Name:   Joshua [name], Joshua [name] Date of Birth:   [birth year] Identification Number:   MRN - [id number] Date of Service:   08/20/2023 23:12:27  Diagnosis:       G40.89 - Other seizures  Impression:      1. The patient has new onset right sided seizures in the context of known multifocal brain metastases, the largest of which is in the left hemisphere. Agree with Decadron and Keppra load. Given 2 mg of Ativan and then an additional 1 mg of Ativan for continued subtle right under eye twitching.  Given that he still maintaining his airway and seizures have improved but not resolved.    Consideration can be made for loading with fosphenytoin 20mg /kg per continued seizure or lacosamide 200mg . after Keppra load. He will need to grams twice a day in conjunction with continuation of whatever alternate agent is utilized if necessary. Agree that he would likely benefit from continuous EEG. His seizures seem to be more than focal. He does seem to have some trouble getting his thoughts and ideas out that is a little beyond what one would expect for isolated focal seizures. If necessary, can consider elective intubation with anesthesia for continued seizures. Do not feel he is having a vascular event.  Our recommendations are outlined below.  Recommendations:        Stroke/Telemetry Floor       Neuro Checks       Bedside Swallow Eval       DVT Prophylaxis       IV Fluids, Normal Saline       Head of Bed 30 Degrees       Euglycemia and Avoid Hyperthermia (PRN Acetaminophen)  Sign Out:       Discussed with Emergency Department Provider    ------------------------------------------------------------------------------  Advanced Imaging: Advanced Imaging Deferred because:  Stroke not suspected with clinical presentation and exam   Metrics: Last Known Well: 08/20/2023 21:30:00 Dispatch Time: 08/20/2023 23:12:27 Arrival Time:  08/20/2023 22:51:00 Initial Response Time: 08/20/2023 23:25:09 Symptoms: right sided weakness. Initial patient interaction: 08/20/2023 23:32:00 NIHSS Assessment Completed: 08/20/2023 23:42:00 Patient is not a candidate for Thrombolytic. Thrombolytic Medical Decision: 08/20/2023 23:42:00 Patient was not deemed candidate for Thrombolytic because of following reasons: History of previous intracranial hemorrhage, intracranial neoplasm .  I personally Reviewed the CT Head and it Showed no acute abn to my eye, pending radiology review.  Primary Provider Notified of Diagnostic Impression and Management Plan on: 08/21/2023 00:03:59    ------------------------------------------------------------------------------  History of Present Illness: Patient is a 68 year old Male.  Patient was brought by EMS for symptoms of right sided weakness. 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 He has multifocal brain lesions, the largest of which is in the left frontal region where he has vasogenic edema. He did recently have some right-sided weakness that resolved with steroids, which he remains on. He does not have any history of seizures. This evening at 20:30. He called EMS, and he is able to relate that he's had some twitching movements since she called 911. On arrival to the emergency department. He is having pretty clear right hemibody twitches but is able to talk somewhat through them. IV dexamethasone and Keppra have been ordered. He has received radiation as well as immune therapy for his cancer.   Past Medical History:      Hyperlipidemia  Other PMH:  PVD, prediabetes  Medications:  No Anticoagulant use  No Antiplatelet use Reviewed EMR for current medications  Allergies:  Reviewed  Social History: Smoking: No Alcohol Use: No Drug Use: No  Family History:  There is no family history of premature  cerebrovascular disease pertinent to this consultation  ROS : 14 Points Review of Systems was performed and was negative except mentioned in HPI.  Past Surgical History: There Is No Surgical History Contributory To Today's Visit    Examination: BP(130//82), Pulse(75), 1A: Level of Consciousness - Alert; keenly responsive + 0 1B: Ask Month and Age - Both Questions Right + 0 1C: Blink Eyes & Squeeze Hands - Performs Both Tasks + 0 2: Test Horizontal Extraocular Movements - Normal + 0 3: Test Visual Fields - No Visual Loss + 0 4: Test Facial Palsy (Use Grimace if Obtunded) - Partial paralysis (lower face) + 2 5A: Test Left Arm Motor Drift - No Drift for 10 Seconds + 0 5B: Test Right Arm Motor Drift - No Movement + 4 6A: Test Left Leg Motor Drift - No Drift for 5 Seconds + 0 6B: Test Right Leg Motor Drift - Drift, but doesn't hit bed + 1 7: Test Limb Ataxia (FNF/Heel-Shin) - No Ataxia + 0 8: Test Sensation - Mild-Moderate Loss: Less Sharp/More Dull + 1 9: Test Language/Aphasia - Mild-Moderate Aphasia: Some Obvious Changes, Without Significant Limitation + 1 10: Test Dysarthria - Mild-Moderate Dysarthria: Slurring but can be understood + 1 11: Test Extinction/Inattention - No abnormality + 0  NIHSS Score: 10   Pre-Morbid Modified Rankin Scale: 1 Points = No significant disability despite symptoms; able to carry out all usual duties and activities  Spoke with : ED MD  This consult was conducted in real time using interactive audio and Immunologist. Patient was informed of the technology being used for this visit and agreed to proceed. Patient located in hospital and provider located at home/office setting.   Patient is being evaluated for possible acute neurologic impairment and high probability of imminent or life-threatening deterioration. I spent total of 35 minutes providing care to this patient, including time for face to face visit via telemedicine, review of medical  records, imaging studies and discussion of findings with providers, the patient and/or family.   Dr Jossie Ng   [facility] For Inpatient follow-up with [facility] physician please call [facility] at [phone]. As we are not an outpatient service for any post hospital discharge needs please contact the hospital for assistance. If you have any questions for the [facility] physicians or need to reconsult for clinical or diagnostic changes please contact us via [facility] at [phone].

## 2023-08-21 NOTE — ED Notes (Signed)
 EEG at bedside.

## 2023-08-21 NOTE — ED Notes (Signed)
Call placed to [facility] and PT placed on monitor.

## 2023-08-21 NOTE — ED Notes (Signed)
PT back from MRI and reconnected to EEG. Speech still slurred

## 2023-08-21 NOTE — ED Notes (Signed)
Report given to [place], rn@ [facility].

## 2023-08-21 NOTE — Consult Note (Signed)
NEUROLOGY CONSULT NOTE   Date of service: August 21, 2023 Patient Name: Joshua [name] MRN:  [id number] DOB:  05-30-1956 Chief Complaint: "seizures" Requesting Provider: Cy Blamer, MD  History of Present Illness  Joshua [name] is a 68 y.o. male with hx of squamous cell carcinoma of the lung with brain metastasis with baseline right-sided weakness and impaired speech output but no prior history of seizures who was brought into [facility] ED by EMS worsening of his baseline deficit with a last known well of 9:30 PM.  He was noted to have twitching of his right side concerning for a seizure.  He was eval by teleneurology and by the ED team.  He was given Ativan, loaded up with Keppra and Vimpat with significant improvement and almost resolution of the noted twitching activity.  Workup with CT head without contrast demonstrated increasing left frontal parietal edema with mass effect with 9 mm right midline shift.  Case was discussed with me with concerns for continued subclinical seizures specially given his language difficulty.  He was transferred to [facility], ED for continuous EEG for further evaluation and management.  He is somnolent, has no clinical seizure activity.  He has right-sided weakness and reduced speech output with significant slurring of his speech.   ROS  Unable to ascertain due to somnolence.  Past History   Past Medical History:  Diagnosis Date   Allergy    Cancer (HCC)    lung   COPD (chronic obstructive pulmonary disease) (HCC)    Dyspnea    former smoker. 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   Malignant neoplasm metastatic to brain [facility])    Peripheral vascular disease (HCC)    Pre-diabetes    Sleep apnea     Past Surgical History:  Procedure Laterality Date   ANTERIOR CERVICAL DECOMP/DISCECTOMY FUSION N/A 03/14/2020   Procedure: ANTERIOR CERVICAL  DECOMPRESSION/DISCECTOMY FUSION 3 LEVELS C4-7;  Surgeon: Venetia Night, MD;  Location: [facility] ORS;  Service: Neurosurgery;  Laterality: N/A;   BACK SURGERY  2011   CENTRAL LINE INSERTION Right 09/10/2016   Procedure: CENTRAL LINE INSERTION;  Surgeon: Renford Dills, MD;  Location: [facility] ORS;  Service: Vascular;  Laterality: Right;   CERVICAL FUSION     C4-c7 on Mar 14, 2020   COLONOSCOPY  2013 ?   COLONOSCOPY N/A 09/09/2021   Procedure: COLONOSCOPY;  Surgeon: Jaynie Collins, DO;  Location: [facility] ENDOSCOPY;  Service: Gastroenterology;  Laterality: N/A;   CORONARY ANGIOPLASTY     ESOPHAGOGASTRODUODENOSCOPY N/A 09/09/2021   Procedure: ESOPHAGOGASTRODUODENOSCOPY (EGD);  Surgeon: Jaynie Collins, DO;  Location: [facility] ENDOSCOPY;  Service: Gastroenterology;  Laterality: N/A;   FEMORAL-POPLITEAL BYPASS GRAFT Right 09/10/2016   Procedure: BYPASS GRAFT FEMORAL-POPLITEAL ARTERY ( BELOW KNEE );  Surgeon: Renford Dills, MD;  Location: [facility] ORS;  Service: Vascular;  Laterality: Right;   FEMORAL-TIBIAL BYPASS GRAFT Right 03/23/2020   Procedure: BYPASS GRAFT RIGHT FEMORAL- DISTAL TIBIAL ARTERY WITH DISTAL FLOW GRAFT;  Surgeon: Renford Dills, MD;  Location: [facility] ORS;  Service: Vascular;  Laterality: Right;   LEFT HEART CATH AND CORONARY ANGIOGRAPHY Left 05/16/2021   Procedure: LEFT HEART CATH AND CORONARY ANGIOGRAPHY;  Surgeon: Armando Reichert, MD;  Location: [facility] INVASIVE CV LAB;  Service: Cardiovascular;  Laterality: Left;   LOWER EXTREMITY ANGIOGRAPHY Right 11/18/2016   Procedure: Lower Extremity Angiography;  Surgeon: Renford Dills, MD;  Location:  [facility] INVASIVE CV LAB;  Service: Cardiovascular;  Laterality: Right;   LOWER EXTREMITY ANGIOGRAPHY Right 12/09/2016   Procedure: Lower Extremity Angiography;  Surgeon: Renford Dills, MD;  Location: [facility] INVASIVE CV LAB;  Service: Cardiovascular;  Laterality: Right;   LOWER EXTREMITY ANGIOGRAPHY Right 11/15/2019   Procedure: LOWER  EXTREMITY ANGIOGRAPHY;  Surgeon: Renford Dills, MD;  Location: [facility] INVASIVE CV LAB;  Service: Cardiovascular;  Laterality: Right;   LOWER EXTREMITY ANGIOGRAPHY Right 12/01/2019   Procedure: Lower Extremity Angiography;  Surgeon: Annice Needy, MD;  Location: [facility] INVASIVE CV LAB;  Service: Cardiovascular;  Laterality: Right;   LOWER EXTREMITY ANGIOGRAPHY Right 12/02/2019   Procedure: LOWER EXTREMITY ANGIOGRAPHY;  Surgeon: Renford Dills, MD;  Location: [facility] INVASIVE CV LAB;  Service: Cardiovascular;  Laterality: Right;   LOWER EXTREMITY ANGIOGRAPHY Right 03/23/2020   Procedure: Lower Extremity Angiography;  Surgeon: Renford Dills, MD;  Location: [facility] INVASIVE CV LAB;  Service: Cardiovascular;  Laterality: Right;   LOWER EXTREMITY ANGIOGRAPHY Right 09/25/2021   Procedure: Lower Extremity Angiography;  Surgeon: Renford Dills, MD;  Location: [facility] INVASIVE CV LAB;  Service: Cardiovascular;  Laterality: Right;   LOWER EXTREMITY ANGIOGRAPHY Right 09/26/2021   Procedure: Lower Extremity Angiography;  Surgeon: Annice Needy, MD;  Location: [facility] INVASIVE CV LAB;  Service: Cardiovascular;  Laterality: Right;   LOWER EXTREMITY ANGIOGRAPHY Right 04/08/2022   Procedure: Lower Extremity Angiography;  Surgeon: Renford Dills, MD;  Location: [facility] INVASIVE CV LAB;  Service: Cardiovascular;  Laterality: Right;   LOWER EXTREMITY INTERVENTION  11/18/2016   Procedure: Lower Extremity Intervention;  Surgeon: Renford Dills, MD;  Location: [facility] INVASIVE CV LAB;  Service: Cardiovascular;;   PERIPHERAL VASCULAR CATHETERIZATION Right 01/02/2015   Procedure: Lower Extremity Angiography;  Surgeon: Renford Dills, MD;  Location: [facility] INVASIVE CV LAB;  Service: Cardiovascular;  Laterality: Right;   PERIPHERAL VASCULAR CATHETERIZATION Right 06/18/2015   Procedure: Lower Extremity Angiography;  Surgeon: Annice Needy, MD;  Location: [facility] INVASIVE CV LAB;  Service: Cardiovascular;  Laterality: Right;   PERIPHERAL  VASCULAR CATHETERIZATION  06/18/2015   Procedure: Lower Extremity Intervention;  Surgeon: Annice Needy, MD;  Location: [facility] INVASIVE CV LAB;  Service: Cardiovascular;;   PERIPHERAL VASCULAR CATHETERIZATION N/A 10/09/2015   Procedure: Abdominal Aortogram w/Lower Extremity;  Surgeon: Renford Dills, MD;  Location: [facility] INVASIVE CV LAB;  Service: Cardiovascular;  Laterality: N/A;   PERIPHERAL VASCULAR CATHETERIZATION  10/09/2015   Procedure: Lower Extremity Intervention;  Surgeon: Renford Dills, MD;  Location: [facility] INVASIVE CV LAB;  Service: Cardiovascular;;   PERIPHERAL VASCULAR CATHETERIZATION Right 10/10/2015   Procedure: Lower Extremity Angiography;  Surgeon: Annice Needy, MD;  Location: [facility] INVASIVE CV LAB;  Service: Cardiovascular;  Laterality: Right;   PERIPHERAL VASCULAR CATHETERIZATION Right 10/10/2015   Procedure: Lower Extremity Intervention;  Surgeon: Annice Needy, MD;  Location: [facility] INVASIVE CV LAB;  Service: Cardiovascular;  Laterality: Right;   PERIPHERAL VASCULAR CATHETERIZATION Right 12/03/2015   Procedure: Lower Extremity Angiography;  Surgeon: Annice Needy, MD;  Location: [facility] INVASIVE CV LAB;  Service: Cardiovascular;  Laterality: Right;   PERIPHERAL VASCULAR CATHETERIZATION Right 12/04/2015   Procedure: Lower Extremity Angiography;  Surgeon: Annice Needy, MD;  Location: [facility] INVASIVE CV LAB;  Service: Cardiovascular;  Laterality: Right;   PERIPHERAL VASCULAR CATHETERIZATION  12/04/2015   Procedure: Lower Extremity Intervention;  Surgeon: Annice Needy, MD;  Location: [facility] INVASIVE CV LAB;  Service: Cardiovascular;;   PERIPHERAL VASCULAR CATHETERIZATION Right 06/30/2016   Procedure:  Lower Extremity Angiography;  Surgeon: Annice Needy, MD;  Location: [facility] INVASIVE CV LAB;  Service: Cardiovascular;  Laterality: Right;   PERIPHERAL VASCULAR CATHETERIZATION Right 06/25/2016   Procedure: Lower Extremity Angiography;  Surgeon: Renford Dills, MD;  Location: [facility] INVASIVE CV LAB;  Service:  Cardiovascular;  Laterality: Right;   PERIPHERAL VASCULAR CATHETERIZATION Right 07/01/2016   Procedure: Lower Extremity Angiography;  Surgeon: Renford Dills, MD;  Location: [facility] INVASIVE CV LAB;  Service: Cardiovascular;  Laterality: Right;   PERIPHERAL VASCULAR CATHETERIZATION  07/01/2016   Procedure: Lower Extremity Intervention;  Surgeon: Renford Dills, MD;  Location: [facility] INVASIVE CV LAB;  Service: Cardiovascular;;   PERIPHERAL VASCULAR CATHETERIZATION Right 08/01/2016   Procedure: Lower Extremity Angiography;  Surgeon: Renford Dills, MD;  Location: [facility] INVASIVE CV LAB;  Service: Cardiovascular;  Laterality: Right;   PORTA CATH INSERTION N/A 10/05/2020   Procedure: PORTA CATH INSERTION;  Surgeon: Renford Dills, MD;  Location: [facility] INVASIVE CV LAB;  Service: Cardiovascular;  Laterality: N/A;   stent placement in right leg Right    VIDEO BRONCHOSCOPY N/A 09/14/2020   Procedure: VIDEO BRONCHOSCOPY WITH FLUORO;  Surgeon: Salena Saner, MD;  Location: [facility] ORS;  Service: Cardiopulmonary;  Laterality: N/A;   VIDEO BRONCHOSCOPY WITH ENDOBRONCHIAL ULTRASOUND N/A 09/14/2020   Procedure: VIDEO BRONCHOSCOPY WITH ENDOBRONCHIAL ULTRASOUND;  Surgeon: Salena Saner, MD;  Location: [facility] ORS;  Service: Cardiopulmonary;  Laterality: N/A;    Family History: Family History  Problem Relation Age of Onset   Diabetes Mother    Hypertension Mother    Heart murmur Mother    Leukemia Mother    Throat cancer Maternal Grandmother    Colon cancer Neg Hx    Breast cancer Neg Hx     Social History  reports that he quit smoking about 8 years ago. His smoking use included cigarettes. He started smoking about 46 years ago. He has a 76 pack-year smoking history. He has never used smokeless tobacco. He reports that he does not currently use alcohol. He reports that he does not use drugs.  No Known Allergies  Medications   Current Facility-Administered Medications:    levETIRAcetam  (KEPPRA) tablet 1,000 mg, 1,000 mg, Oral, BID **OR** levETIRAcetam (KEPPRA) IVPB 1000 mg/100 mL premix, 1,000 mg, Intravenous, BID, Erick Blinks, MD  Current Outpatient Medications:    aspirin EC 81 MG tablet, Take 81 mg by mouth daily. Swallow whole., Disp: , Rfl:    clopidogrel (PLAVIX) 75 MG tablet, TAKE 1 TABLET BY MOUTH DAILY, Disp: 100 tablet, Rfl: 2   dexamethasone (DECADRON) 4 MG tablet, Take 0.5 tablets (2 mg total) by mouth daily., Disp: 15 tablet, Rfl: 1   levothyroxine (SYNTHROID) 125 MCG tablet, Take 1 tablet (125 mcg total) by mouth daily before breakfast., Disp: 90 tablet, Rfl: 0   rosuvastatin (CRESTOR) 40 MG tablet, Take 1 tablet (40 mg total) by mouth daily., Disp: 90 tablet, Rfl: 3   Vitamin D, Ergocalciferol, (DRISDOL) 1.25 MG (50000 UNIT) CAPS capsule, Take 1 capsule (50,000 Units total) by mouth every 7 (seven) days., Disp: 13 capsule, Rfl: 3  Facility-Administered Medications Ordered in Other Encounters:    albuterol (PROVENTIL) (2.5 MG/3ML) 0.083% nebulizer solution 2.5 mg, 2.5 mg, Nebulization, Once, Salena Saner, MD   heparin lock flush 100 unit/mL, 500 Units, Intracatheter, Once PRN, [name], MD  Vitals   Vitals:   08/21/23 0215 08/21/23 0230 08/21/23 0345 08/21/23 0545  BP: (!) 167/74 125/74  127/76  Pulse: 88 83  78 81  Resp: 20 17 16 17   Temp: 97.9 F (36.6 C)     TempSrc: Oral     SpO2: 100% 95% 98% 92%    There is no height or weight on file to calculate BMI.  Physical Exam   General: Laying comfortably in bed; in no acute distress.  HENT: Normal oropharynx and mucosa. Normal external appearance of ears and nose.  Neck: Supple, no pain or tenderness  CV: No JVD. No peripheral edema.  Pulmonary: Symmetric Chest rise. Normal respiratory effort.  Abdomen: Soft to touch, non-tender.  Ext: No cyanosis, edema, or deformity  Skin: No rash. Normal palpation of skin.   Musculoskeletal: Normal digits and nails by inspection. No clubbing.    Neurologic Examination  Mental status/Cognition: Lethargic with sonorous respirations, oriented to self, place, month and year, poor attention and keeps falling asleep during encounter.  Speech/language: Decreased speech output, nonfluent, severely dysarthric speech, comprehension intact to simple commands, object naming intact. Cranial nerves:   CN II Pupils equal and reactive to light, difficult to assess for visual field deficit the patient continued to fall asleep.   CN III,IV,VI EOM intact, no gaze preference or deviation, no nystagmus   CN V normal sensation in V1, V2, and V3 segments bilaterally   CN VII Right facial droop   CN VIII normal hearing to speech    CN IX & X normal palatal elevation, no uvular deviation    CN XI 5/5 head turn and 5/5 shoulder shrug bilaterally    CN XII midline tongue protrusion    Motor:  Muscle bulk: Normal, tone is normal.  No tremors or twitching noted. Difficult to do detailed strength testing secondary to somnolence. Left upper extremity is grossly 4 out of 5 Right upper extremity is grossly 3 out of 5. Left lower extremity is grossly 4 out of 5 Right lower extremities grossly 3 out of 5.  Sensation:  Light touch    Pin prick Localized to proximal pinch in all extremities.   Temperature    Vibration   Proprioception    Coordination/Complex Motor:  - Finger to Nose intact in left upper extremity.  Unable to do with right upper extremity.  Labs/Imaging/Neurodiagnostic studies   CBC:  Recent Labs  Lab 08-21-23 2255  WBC 6.9  NEUTROABS 5.7  HGB 13.5  HCT 42.6  MCV 85.7  PLT 225   Basic Metabolic Panel:  Lab Results  Component Value Date   NA 143 2023-08-21   K 4.4 August 21, 2023   CO2 24 August 21, 2023   GLUCOSE 136 (H) Aug 21, 2023   BUN 18 21-Aug-2023   CREATININE 1.16 08/21/2023   CALCIUM 9.4 2023/08/21   GFRNONAA >60 Aug 21, 2023   GFRAA >60 03/30/2020   Lipid Panel:  Lab Results  Component Value Date   LDLCALC 64 11/21/2020    HgbA1c:  Lab Results  Component Value Date   HGBA1C 6.3 (H) 06/02/2023   Urine Drug Screen: No results found for: "LABOPIA", "COCAINSCRNUR", "LABBENZ", "AMPHETMU", "THCU", "LABBARB"  Alcohol Level No results found for: "ETH" INR  Lab Results  Component Value Date   INR 1.0 08/21/23   APTT  Lab Results  Component Value Date   APTT 28 2023-08-21   AED levels: No results found for: "PHENYTOIN", "ZONISAMIDE", "LAMOTRIGINE", "LEVETIRACETA"  CT Head without contrast(Personally reviewed): Increasing left frontoparietal edema and mass effect with 9 mm of right midline shift, likely related to worsening metastatic disease given findings on recent MRI head. Repeat MRI head with  contrast could confirm and better assess if clinically warranted.  MRI Brain(Personally reviewed): Pending  Neurodiagnostics cEEG:  Pending  ASSESSMENT   Joshua [name] is a 68 y.o. 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  He was noted to have twitching of his right side concerning for a seizure.  Twitching improved with Keppra, Ativan, Vimpat and Decadron.  On my evaluation at [facility], no clinical seizure activity is noted.  RECOMMENDATIONS  - cEEG - cont Keppra 1000mg  BID - increase decadron to 4mg  Q8 hours - MRI Brain with and without contrast. -Further AEDs based on continuous EEG. ______________________________________________________________________  This patient is critically ill and at significant risk of neurological worsening, death and care requires constant monitoring of vital signs, hemodynamics,respiratory and cardiac monitoring, neurological assessment, discussion with family, other specialists and medical  decision making of high complexity. I spent 40 minutes of neurocritical care time  in the care of  this patient. This was time spent independent of any time provided by nurse practitioner or PA.  Erick Blinks [facility] 08/21/2023  6:15 AM   Signed, Erick Blinks, MD [facility]

## 2023-08-21 NOTE — Procedures (Addendum)
Patient Name: Joshua [name]  MRN: [id number]  Epilepsy Attending: Charlsie Quest  Referring Physician/Provider: Erick Blinks, MD  Duration: 08/21/2023 0420 to 2304  Patient history: 68 y.o. 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  He was noted to have twitching of his right side concerning for a seizure. EEG to evaluate for seizure  Level of alertness: Awake, asleep  AEDs during EEG study: LEV  Technical aspects: This EEG study was done with scalp electrodes positioned according to the 10-20 International system of electrode placement. Electrical activity was reviewed with band pass filter of 1-70Hz , sensitivity of 7 uV/mm, display speed of 62mm/sec with a 60Hz  notched filter applied as appropriate. EEG data were recorded continuously and digitally stored.  Video monitoring was available and reviewed as appropriate.  Description: The posterior dominant rhythm consists of 9 Hz activity of moderate voltage (25-35 uV) seen predominantly in posterior head regions, symmetric and reactive to eye opening and eye closing. Sleep was characterized by vertex waves, sleep spindles (12 to 14 Hz), maximal frontocentral region. EEG showed intermittent 3 to 5 Hz theta-delta slowing in left temporal region. Hyperventilation and photic stimulation were not performed.    EEG was disconnected between 0906 to 1001 and between 1554 to 1845  ABNORMALITY - Intermittent slow, left temporal region.  IMPRESSION: This study is suggestive of cortical dysfunction arising from left temporal region likely secondary to underlying structural abnormality/ tumor. No seizures or epileptiform discharges were seen throughout the recording.  [name] [name]

## 2023-08-21 NOTE — ED Notes (Signed)
Pt unable lto sign consent for transfer but verbalized consent to transfer. [facility] here now to transport pt.

## 2023-08-21 NOTE — ED Triage Notes (Signed)
BIBA [facility]. Transfer from [facility]. Pt coming over due to seizures. Hx lung ca w/ mets to the brain. Difficulty w/ speech today. Needs continuous EEG.

## 2023-08-21 NOTE — H&P (Incomplete)
History and Physical    Patient: Joshua [name] ZOX:[id number] DOB: 1956/02/09 DOA: 08/21/2023 DOS: the patient was seen and examined on 08/21/2023 PCP: Jacky Kindle, FNP (Inactive)  Patient coming from: [facility] transfer ED to ED  Chief Complaint:  Chief Complaint  Patient presents with   Seizures   HPI: Joshua [name] is a 68 y.o. 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  Patient was last known well at 9:30 PM yesterday evening.  At baseline patient lives alone and cares for himself.      In the emergency department at [facility] patient was noted to be mildly tachycardic with blood pressures initially elevated up to 171/89, and all other vital signs maintained.  Labs from 1/31 significant for mildly elevated anion gap of 16 with CO2 24, and glucose 136.  CT scan of the head noted increasing left frontal parietal edema and mass effect with 9 mm right midline shift likely relating to worsening metastatic disease.  Twitching improved after patient was given Keppra, Ativan, Vimpat, and Decadron.  Patient was transferred to ED for need of continuous EEG monitoring.  Review of Systems: {ROS_Text:26778} Past Medical History:  Diagnosis Date   Allergy    Cancer (HCC)    lung   COPD (chronic obstructive pulmonary disease) (HCC)    Dyspnea    former smoker. 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   Malignant neoplasm metastatic to brain [facility])    Peripheral vascular disease (HCC)    Pre-diabetes    Sleep apnea    Past Surgical History:  Procedure Laterality Date   ANTERIOR CERVICAL DECOMP/DISCECTOMY FUSION N/A 03/14/2020   Procedure: ANTERIOR CERVICAL DECOMPRESSION/DISCECTOMY FUSION 3 LEVELS C4-7;  Surgeon: Venetia Night, MD;  Location: [facility] ORS;   Service: Neurosurgery;  Laterality: N/A;   BACK SURGERY  2011   CENTRAL LINE INSERTION Right 09/10/2016   Procedure: CENTRAL LINE INSERTION;  Surgeon: Renford Dills, MD;  Location: [facility] ORS;  Service: Vascular;  Laterality: Right;   CERVICAL FUSION     C4-c7 on Mar 14, 2020   COLONOSCOPY  2013 ?   COLONOSCOPY N/A 09/09/2021   Procedure: COLONOSCOPY;  Surgeon: Jaynie Collins, DO;  Location: [facility] ENDOSCOPY;  Service: Gastroenterology;  Laterality: N/A;   CORONARY ANGIOPLASTY     ESOPHAGOGASTRODUODENOSCOPY N/A 09/09/2021   Procedure: ESOPHAGOGASTRODUODENOSCOPY (EGD);  Surgeon: Jaynie Collins, DO;  Location: [facility] ENDOSCOPY;  Service: Gastroenterology;  Laterality: N/A;   FEMORAL-POPLITEAL BYPASS GRAFT Right 09/10/2016   Procedure: BYPASS GRAFT FEMORAL-POPLITEAL ARTERY ( BELOW KNEE );  Surgeon: Renford Dills, MD;  Location: [facility] ORS;  Service: Vascular;  Laterality: Right;   FEMORAL-TIBIAL BYPASS GRAFT Right 03/23/2020   Procedure: BYPASS GRAFT RIGHT FEMORAL- DISTAL TIBIAL ARTERY WITH DISTAL FLOW GRAFT;  Surgeon: Renford Dills, MD;  Location: [facility] ORS;  Service: Vascular;  Laterality: Right;   LEFT HEART CATH AND CORONARY ANGIOGRAPHY Left 05/16/2021   Procedure: LEFT HEART CATH AND CORONARY ANGIOGRAPHY;  Surgeon: Armando Reichert, MD;  Location: [facility] INVASIVE CV LAB;  Service: Cardiovascular;  Laterality: Left;   LOWER EXTREMITY ANGIOGRAPHY Right 11/18/2016   Procedure: Lower Extremity Angiography;  Surgeon: Renford Dills, MD;  Location: [facility] INVASIVE CV LAB;  Service: Cardiovascular;  Laterality: Right;   LOWER EXTREMITY ANGIOGRAPHY Right  12/09/2016   Procedure: Lower Extremity Angiography;  Surgeon: Renford Dills, MD;  Location: [facility] INVASIVE CV LAB;  Service: Cardiovascular;  Laterality: Right;   LOWER EXTREMITY ANGIOGRAPHY Right 11/15/2019   Procedure: LOWER EXTREMITY ANGIOGRAPHY;  Surgeon: Renford Dills, MD;  Location: [facility] INVASIVE CV LAB;  Service:  Cardiovascular;  Laterality: Right;   LOWER EXTREMITY ANGIOGRAPHY Right 12/01/2019   Procedure: Lower Extremity Angiography;  Surgeon: Annice Needy, MD;  Location: [facility] INVASIVE CV LAB;  Service: Cardiovascular;  Laterality: Right;   LOWER EXTREMITY ANGIOGRAPHY Right 12/02/2019   Procedure: LOWER EXTREMITY ANGIOGRAPHY;  Surgeon: Renford Dills, MD;  Location: [facility] INVASIVE CV LAB;  Service: Cardiovascular;  Laterality: Right;   LOWER EXTREMITY ANGIOGRAPHY Right 03/23/2020   Procedure: Lower Extremity Angiography;  Surgeon: Renford Dills, MD;  Location: [facility] INVASIVE CV LAB;  Service: Cardiovascular;  Laterality: Right;   LOWER EXTREMITY ANGIOGRAPHY Right 09/25/2021   Procedure: Lower Extremity Angiography;  Surgeon: Renford Dills, MD;  Location: [facility] INVASIVE CV LAB;  Service: Cardiovascular;  Laterality: Right;   LOWER EXTREMITY ANGIOGRAPHY Right 09/26/2021   Procedure: Lower Extremity Angiography;  Surgeon: Annice Needy, MD;  Location: [facility] INVASIVE CV LAB;  Service: Cardiovascular;  Laterality: Right;   LOWER EXTREMITY ANGIOGRAPHY Right 04/08/2022   Procedure: Lower Extremity Angiography;  Surgeon: Renford Dills, MD;  Location: [facility] INVASIVE CV LAB;  Service: Cardiovascular;  Laterality: Right;   LOWER EXTREMITY INTERVENTION  11/18/2016   Procedure: Lower Extremity Intervention;  Surgeon: Renford Dills, MD;  Location: [facility] INVASIVE CV LAB;  Service: Cardiovascular;;   PERIPHERAL VASCULAR CATHETERIZATION Right 01/02/2015   Procedure: Lower Extremity Angiography;  Surgeon: Renford Dills, MD;  Location: [facility] INVASIVE CV LAB;  Service: Cardiovascular;  Laterality: Right;   PERIPHERAL VASCULAR CATHETERIZATION Right 06/18/2015   Procedure: Lower Extremity Angiography;  Surgeon: Annice Needy, MD;  Location: [facility] INVASIVE CV LAB;  Service: Cardiovascular;  Laterality: Right;   PERIPHERAL VASCULAR CATHETERIZATION  06/18/2015   Procedure: Lower Extremity Intervention;  Surgeon: Annice Needy, MD;  Location: [facility] INVASIVE CV LAB;  Service: Cardiovascular;;   PERIPHERAL VASCULAR CATHETERIZATION N/A 10/09/2015   Procedure: Abdominal Aortogram w/Lower Extremity;  Surgeon: Renford Dills, MD;  Location: [facility] INVASIVE CV LAB;  Service: Cardiovascular;  Laterality: N/A;   PERIPHERAL VASCULAR CATHETERIZATION  10/09/2015   Procedure: Lower Extremity Intervention;  Surgeon: Renford Dills, MD;  Location: [facility] INVASIVE CV LAB;  Service: Cardiovascular;;   PERIPHERAL VASCULAR CATHETERIZATION Right 10/10/2015   Procedure: Lower Extremity Angiography;  Surgeon: Annice Needy, MD;  Location: [facility] INVASIVE CV LAB;  Service: Cardiovascular;  Laterality: Right;   PERIPHERAL VASCULAR CATHETERIZATION Right 10/10/2015   Procedure: Lower Extremity Intervention;  Surgeon: Annice Needy, MD;  Location: [facility] INVASIVE CV LAB;  Service: Cardiovascular;  Laterality: Right;   PERIPHERAL VASCULAR CATHETERIZATION Right 12/03/2015   Procedure: Lower Extremity Angiography;  Surgeon: Annice Needy, MD;  Location: [facility] INVASIVE CV LAB;  Service: Cardiovascular;  Laterality: Right;   PERIPHERAL VASCULAR CATHETERIZATION Right 12/04/2015   Procedure: Lower Extremity Angiography;  Surgeon: Annice Needy, MD;  Location: [facility] INVASIVE CV LAB;  Service: Cardiovascular;  Laterality: Right;   PERIPHERAL VASCULAR CATHETERIZATION  12/04/2015   Procedure: Lower Extremity Intervention;  Surgeon: Annice Needy, MD;  Location: [facility] INVASIVE CV LAB;  Service: Cardiovascular;;   PERIPHERAL VASCULAR CATHETERIZATION Right 06/30/2016   Procedure: Lower Extremity Angiography;  Surgeon: Annice Needy, MD;  Location: [facility] INVASIVE CV LAB;  Service: Cardiovascular;  Laterality: Right;   PERIPHERAL VASCULAR CATHETERIZATION Right 06/25/2016   Procedure: Lower Extremity Angiography;  Surgeon: Renford Dills, MD;  Location: [facility] INVASIVE CV LAB;  Service: Cardiovascular;  Laterality: Right;   PERIPHERAL VASCULAR CATHETERIZATION Right 07/01/2016    Procedure: Lower Extremity Angiography;  Surgeon: Renford Dills, MD;  Location: [facility] INVASIVE CV LAB;  Service: Cardiovascular;  Laterality: Right;   PERIPHERAL VASCULAR CATHETERIZATION  07/01/2016   Procedure: Lower Extremity Intervention;  Surgeon: Renford Dills, MD;  Location: [facility] INVASIVE CV LAB;  Service: Cardiovascular;;   PERIPHERAL VASCULAR CATHETERIZATION Right 08/01/2016   Procedure: Lower Extremity Angiography;  Surgeon: Renford Dills, MD;  Location: [facility] INVASIVE CV LAB;  Service: Cardiovascular;  Laterality: Right;   PORTA CATH INSERTION N/A 10/05/2020   Procedure: PORTA CATH INSERTION;  Surgeon: Renford Dills, MD;  Location: [facility] INVASIVE CV LAB;  Service: Cardiovascular;  Laterality: N/A;   stent placement in right leg Right    VIDEO BRONCHOSCOPY N/A 09/14/2020   Procedure: VIDEO BRONCHOSCOPY WITH FLUORO;  Surgeon: Salena Saner, MD;  Location: [facility] ORS;  Service: Cardiopulmonary;  Laterality: N/A;   VIDEO BRONCHOSCOPY WITH ENDOBRONCHIAL ULTRASOUND N/A 09/14/2020   Procedure: VIDEO BRONCHOSCOPY WITH ENDOBRONCHIAL ULTRASOUND;  Surgeon: Salena Saner, MD;  Location: [facility] ORS;  Service: Cardiopulmonary;  Laterality: N/A;   Social History:  reports that he quit smoking about 8 years ago. His smoking use included cigarettes. He started smoking about 46 years ago. He has a 76 pack-year smoking history. He has never used smokeless tobacco. He reports that he does not currently use alcohol. He reports that he does not use drugs.  No Known Allergies  Family History  Problem Relation Age of Onset   Diabetes Mother    Hypertension Mother    Heart murmur Mother    Leukemia Mother    Throat cancer Maternal Grandmother    Colon cancer Neg Hx    Breast cancer Neg Hx     Prior to Admission medications   Medication Sig Start Date End Date Taking? Authorizing Provider  aspirin EC 81 MG tablet Take 81 mg by mouth daily. Swallow whole.    [provider]   clopidogrel (PLAVIX) 75 MG tablet TAKE 1 TABLET BY MOUTH DAILY 05/11/23   Georgiana Spinner, NP  dexamethasone (DECADRON) 4 MG tablet Take 0.5 tablets (2 mg total) by mouth daily. 08/18/23   Henreitta Leber, MD  levothyroxine (SYNTHROID) 125 MCG tablet Take 1 tablet (125 mcg total) by mouth daily before breakfast. 08/05/23   [name], MD  rosuvastatin (CRESTOR) 40 MG tablet Take 1 tablet (40 mg total) by mouth daily. 06/03/23   Jacky Kindle, FNP  Vitamin D, Ergocalciferol, (DRISDOL) 1.25 MG (50000 UNIT) CAPS capsule Take 1 capsule (50,000 Units total) by mouth every 7 (seven) days. 06/03/23   Jacky Kindle, FNP    Physical Exam: Vitals:   08/21/23 0230 08/21/23 0345 08/21/23 0545 08/21/23 0600  BP: 125/74  127/76 130/76  Pulse: 83 78 81 86  Resp: 17 16 17 14   Temp:    97.8 F (36.6 C)  TempSrc:    Oral  SpO2: 95% 98% 92% 94%   *** Data Reviewed: {Tip this will not be part of the note when signed- Document your independent interpretation of telemetry tracing, EKG, lab, Radiology test or any other diagnostic tests. Add any new diagnostic test ordered today. (Optional):26781} {Results:26384}  Assessment and Plan:  Seizure Cerebral edema with  mass effect  -Admit to a progressive bed -Neurochecks -Continuous EEG monitoring   -Decadron increased to 4 mg IV every 8 hours -MRI of the brain brain with and without contrast -Continue Keppra 1000 mg twice daily -Appreciate neurology consultative services we will follow-up for any further recommendations    DVT prophylaxis: Lovenox  Advance Care Planning:   Code Status: Prior ***  Consults: ***  Family Communication: ***  Severity of Illness: {Observation/Inpatient:21159}  Author: Clydie Braun, MD 08/21/2023 7:32 AM  For on call review [email or web address].

## 2023-08-22 DIAGNOSIS — R569 Unspecified convulsions: Secondary | ICD-10-CM

## 2023-08-22 DIAGNOSIS — G936 Cerebral edema: Secondary | ICD-10-CM | POA: Diagnosis present

## 2023-08-22 DIAGNOSIS — G935 Compression of brain: Secondary | ICD-10-CM | POA: Diagnosis present

## 2023-08-22 LAB — BASIC METABOLIC PANEL
Anion gap: 12 (ref 5–15)
BUN: 20 mg/dL (ref 8–23)
CO2: 25 mmol/L (ref 22–32)
Calcium: 9.2 mg/dL (ref 8.9–10.3)
Chloride: 109 mmol/L (ref 98–111)
Creatinine, Ser: 1.08 mg/dL (ref 0.61–1.24)
GFR, Estimated: >60 mL/min (ref >60)
Glucose, Bld: 111 mg/dL — ABNORMAL HIGH (ref 70–99)
Potassium: 4.1 mmol/L (ref 3.5–5.1)
Sodium: 146 mmol/L — ABNORMAL HIGH (ref 135–145)

## 2023-08-22 LAB — TSH: TSH: 2.888 u[IU]/mL (ref 0.350–4.500)

## 2023-08-22 MED ORDER — ORAL CARE MOUTH RINSE: 15.0000 mL | OROMUCOSAL | Status: DC | PRN

## 2023-08-22 MED ORDER — LACTATED RINGERS IV SOLN: INTRAVENOUS | Status: AC

## 2023-08-22 MED ORDER — SODIUM CHLORIDE 0.9% FLUSH
10.0000 mL | INTRAVENOUS | Status: DC | PRN
Start: 2023-08-22 — End: 2023-08-26

## 2023-08-22 MED ORDER — LEVOTHYROXINE SODIUM 25 MCG PO TABS
125.0000 ug | ORAL_TABLET | Freq: Every day | ORAL | Status: DC
  Administered 2023-08-22 – 2023-08-26 (×5): 125 ug via ORAL
  Filled 2023-08-22 (×5): qty 1

## 2023-08-22 MED ORDER — SODIUM CHLORIDE 0.9% FLUSH
10.0000 mL | Freq: Two times a day (BID) | INTRAVENOUS | Status: DC
  Administered 2023-08-22 – 2023-08-26 (×9): 10 mL

## 2023-08-22 MED ORDER — CLOPIDOGREL BISULFATE 75 MG PO TABS
75.0000 mg | ORAL_TABLET | Freq: Every day | ORAL | Status: DC
  Administered 2023-08-22 – 2023-08-26 (×5): 75 mg via ORAL
  Filled 2023-08-22 (×5): qty 1

## 2023-08-22 MED ORDER — ROSUVASTATIN CALCIUM 20 MG PO TABS
40.0000 mg | ORAL_TABLET | Freq: Every day | ORAL | Status: DC
  Administered 2023-08-22 – 2023-08-26 (×5): 40 mg via ORAL
  Filled 2023-08-22 (×5): qty 2

## 2023-08-22 MED ORDER — ASPIRIN 81 MG PO TBEC
81.0000 mg | DELAYED_RELEASE_TABLET | Freq: Every day | ORAL | Status: DC
  Administered 2023-08-22 – 2023-08-26 (×5): 81 mg via ORAL
  Filled 2023-08-22 (×5): qty 1

## 2023-08-22 MED ORDER — CHLORHEXIDINE GLUCONATE CLOTH 2 % EX PADS
6.0000 | MEDICATED_PAD | Freq: Every day | CUTANEOUS | Status: DC
  Administered 2023-08-22 – 2023-08-26 (×5): 6 via TOPICAL

## 2023-08-22 NOTE — Progress Notes (Signed)
 Inpatient Rehab Admissions Coordinator:  ? ?Per therapy recommendations,  patient was screened for CIR candidacy by Megan Salon, MS, CCC-SLP. At this time, Pt. Appears to be a a potential candidate for CIR. I will place   order for rehab consult per protocol for full assessment. Please contact me any with questions. ? ?Megan Salon, MS, CCC-SLP ?Rehab Admissions Coordinator  ?[phone] (celll) ?[phone] (office) ? ?

## 2023-08-22 NOTE — Progress Notes (Signed)
PROGRESS NOTE  Joshua [name]:[id number] DOB: [birth year] DOA: 08/21/2023 PCP: Jacky Kindle, FNP (Inactive)  HPI/Recap of past 24 hours: Joshua [name] is a 68 y.o. 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 At baseline patient has right-sided weakness and slurred speech which patient noticed has been worsening for the past week.  Patient discussed with Dr. Barbaraann Cao who prescribed p.o. Decadron, despite taking it, had no improvement, which may have led to his fall. Patient had been noticed to have jerking movements on his right side. In the ED at [facility] patient was seen as a code stroke. CT scan of the head noted increasing left frontal parietal edema and mass effect with 9 mm right midline shift likely relating to worsening metastatic disease.  Twitching improved after patient was given Keppra, Ativan, Vimpat, and Decadron.  Neurology consulted, recommended continuous EEG monitoring.  Patient admitted for further management.    Today, patient denies any new complaints, still with weakness on the right side, and slurred speech.  Alert, oriented x 3.   Assessment/Plan: Principal Problem:   Seizure (HCC) Active Problems:   Cerebral edema (HCC)   Neoplasm causing mass effect and brain compression on adjacent structures (HCC)   Squamous cell carcinoma of right lung (HCC)   Malignant neoplasm metastatic to brain [facility])   PVD (peripheral vascular disease) (HCC)   Hypothyroidism due to medication   Hyperlipidemia, unspecified   ?Seizure Fall Cerebral edema with mass effect No further episodes of jerking movements noted CT of the brain revealed increasing left frontal parietal edema and mass effect with 9 mm right midline shift likely relating to worsening metastatic  disease  MRI brain w/wo contrast showed stable to minimally increased size of 2 left cerebral metastases since 08/05/2023. Worsening, extensive vasogenic edema with 7 mm of rightward midline shift.  No new metastasis Continuous EEG monitoring suggestive of cortical dysfunction arising from left temporal region, no seizure or epileptiform discharges were seen Neurology consulted, appreciate recs Dr. Barbaraann Cao of medical oncology added to treatment team Neurochecks, seizure precautions Continue IV Decadron, Keppra PT/OT/speech to evaluate and treat   Malignant neoplasm metastatic to brain Squamous cell carcinoma of the right lung Patient is currently being followed by Dr. Barbaraann Cao previously treated with chemotherapy and radiation treatments Patient also followed by Dr. Jayme Cloud of pulmonology Dr. Barbaraann Cao of medical oncology added to treatment team and informed  Hypernatremia Continue IV fluid for now   Peripheral vascular disease Patient has a history of claudication status post prior bypass grafting and followed by Dr. Gilda Crease of vascular surgery. Continue aspirin, Plavix, and statin   COPD Stable Albuterol nebs as needed for shortness of breath/wheezing   Hyperlipidemia Continue Crestor   Hypothyroidism TSH WNL Continue levothyroxine      Estimated body mass index is 29.83 kg/m as calculated from the following:   Height as of this encounter: 5\' 11"  (1.803 m).   Weight as of this encounter: 97 kg.     Code Status: Full  Family Communication: None at bedside  Disposition Plan: Status is: Inpatient Remains inpatient appropriate because: Level of care      Consultants: Neurology  Procedures: Continuous EEG  Antimicrobials: None  DVT prophylaxis: Eliquis   Objective: Vitals:   08/21/23 2037 08/22/23 0008 08/22/23 0411 08/22/23 0940  BP: 119/70 (!) 113/53 121/61 Marland Kitchen)  149/80  Pulse: 76 77 69 75  Resp: 18 18 18 18   Temp: 97.6 F (36.4 C) 98.3 F (36.8 C) 98  F (36.7 C) 97.8 F (36.6 C)  TempSrc: Oral Oral Oral Oral  SpO2: 99% 97% 97% 98%  Weight:      Height:        Intake/Output Summary (Last 24 hours) at 08/22/2023 1054 Last data filed at 08/22/2023 1000 Gross per 24 hour  Intake 1410.82 ml  Output 3130 ml  Net -1719.18 ml   Filed Weights   08/21/23 1024 08/21/23 1831  Weight: 99.8 kg 97 kg    Exam: General: NAD, noted slurred speech Cardiovascular: S1, S2 present Respiratory: CTAB Abdomen: Soft, nontender, nondistended, bowel sounds present Musculoskeletal: No bilateral pedal edema noted Skin: Normal Psychiatry: Normal mood  Neurology: 3/5 strength in right upper and lower extremity, 5/5 in left upper and lower extremity, sensation intact.    Data Reviewed: CBC: Recent Labs  Lab 08/20/23 2255 08/21/23 1736  WBC 6.9 10.0  NEUTROABS 5.7  --   HGB 13.5 13.0  HCT 42.6 39.9  MCV 85.7 83.6  PLT 225 198   Basic Metabolic Panel: Recent Labs  Lab 08/20/23 2255 08/21/23 1736 08/22/23 0648  NA 143 142 146*  K 4.4 3.9 4.1  CL 103 108 109  CO2 24 23 25   GLUCOSE 136* 121* 111*  BUN 18 18 20   CREATININE 1.16 0.97 1.08  CALCIUM 9.4 9.0 9.2  MG  --  2.2  --    GFR: Estimated Creatinine Clearance: 81 mL/min (by C-G formula based on SCr of 1.08 mg/dL). Liver Function Tests: Recent Labs  Lab 08/20/23 2255  AST 28  ALT 26  ALKPHOS 68  BILITOT 0.6  PROT 7.6  ALBUMIN 4.0   No results for input(s): "LIPASE", "AMYLASE" in the last 168 hours. No results for input(s): "AMMONIA" in the last 168 hours. Coagulation Profile: Recent Labs  Lab 08/20/23 2255  INR 1.0   Cardiac Enzymes: No results for input(s): "CKTOTAL", "CKMB", "CKMBINDEX", "TROPONINI" in the last 168 hours. BNP (last 3 results) No results for input(s): "PROBNP" in the last 8760 hours. HbA1C: No results for input(s): "HGBA1C" in the last 72 hours. CBG: Recent Labs  Lab 08/20/23 2253  GLUCAP 143*   Lipid Profile: No results for input(s):  "CHOL", "HDL", "LDLCALC", "TRIG", "CHOLHDL", "LDLDIRECT" in the last 72 hours. Thyroid Function Tests: Recent Labs    08/22/23 0648  TSH 2.888   Anemia Panel: No results for input(s): "VITAMINB12", "FOLATE", "FERRITIN", "TIBC", "IRON", "RETICCTPCT" in the last 72 hours. Urine analysis:    Component Value Date/Time   COLORURINE YELLOW (A) 11/21/2020 1150   APPEARANCEUR HAZY (A) 11/21/2020 1150   LABSPEC 1.014 11/21/2020 1150   PHURINE 5.0 11/21/2020 1150   GLUCOSEU NEGATIVE 11/21/2020 1150   HGBUR NEGATIVE 11/21/2020 1150   BILIRUBINUR NEGATIVE 11/21/2020 1150   KETONESUR NEGATIVE 11/21/2020 1150   PROTEINUR NEGATIVE 11/21/2020 1150   UROBILINOGEN 0.2 08/02/2009 1129   NITRITE NEGATIVE 11/21/2020 1150   LEUKOCYTESUR NEGATIVE 11/21/2020 1150   Sepsis Labs: @LABRCNTIP (procalcitonin:4,lacticidven:4)  )No results found for this or any previous visit (from the past 240 hours).    Studies: No results found.  Scheduled Meds:  aspirin EC  81 mg Oral Daily   clopidogrel  75 mg Oral Daily   dexamethasone (DECADRON) injection  4 mg Intravenous Q12H   enoxaparin (LOVENOX) injection  40 mg Subcutaneous Q24H   levETIRAcetam  1,000 mg Oral BID  levothyroxine  125 mcg Oral QAC breakfast   rosuvastatin  40 mg Oral Daily    Continuous Infusions:  levETIRAcetam 400 mL/hr at 08/22/23 0604     LOS: 1 day     Briant Cedar, MD [facility]  If 7PM-7AM, please contact night-coverage [email or web address] 08/22/2023, 10:54 AM

## 2023-08-22 NOTE — Evaluation (Signed)
Speech Language Pathology Evaluation Patient Details Name: Joshua [name] MRN: [id number] DOB: 01-13-1956 Today's Date: 08/22/2023 Time: 4098-1191 SLP Time Calculation (min) (ACUTE ONLY): 13 min  Problem List:  Patient Active Problem List   Diagnosis Date Noted   Cerebral edema (HCC) 08/22/2023   Neoplasm causing mass effect and brain compression on adjacent structures (HCC) 08/22/2023   Seizure (HCC) 08/21/2023   Elevated blood pressure reading in office without diagnosis of hypertension 06/02/2023   Avitaminosis D 06/02/2023   Benign localized prostatic hyperplasia without lower urinary tract symptoms (LUTS) 06/02/2023   Chemotherapy-induced neutropenia (HCC) 06/02/2023   Thrombocytopenia (HCC) 06/02/2023   Elevated serum creatinine 05/28/2023   COPD (chronic obstructive pulmonary disease) (HCC) 12/28/2021   Malignant neoplasm metastatic to brain (HCC) 10/08/2021   Hypothyroidism, unspecified 10/08/2021   Parotid nodule 08/16/2021   Organizing pneumonia (HCC) 04/04/2021   Hypothyroidism due to medication 04/04/2021   Encounter for antineoplastic immunotherapy 04/04/2021   Antineoplastic chemotherapy induced anemia 10/17/2020   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/20/2020   Iron deficiency anemia 08/30/2020   Cervical myelopathy (HCC) 03/14/2020   Peripheral neuropathy 10/14/2019   Chronic left-sided low back pain without sciatica 04/13/2019   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   Malignant neoplasm metastatic to brain [facility])    Peripheral vascular disease (HCC)    Pre-diabetes    Sleep apnea    Past Surgical History:  Past Surgical History:  Procedure Laterality Date   ANTERIOR CERVICAL DECOMP/DISCECTOMY FUSION N/A 03/14/2020   Procedure: ANTERIOR CERVICAL DECOMPRESSION/DISCECTOMY FUSION 3 LEVELS C4-7;  Surgeon: Venetia Night, MD;  Location: [facility] ORS;  Service: Neurosurgery;  Laterality: N/A;   BACK SURGERY  2011   CENTRAL LINE INSERTION Right 09/10/2016   Procedure: CENTRAL LINE INSERTION;  Surgeon: Renford Dills, MD;  Location: [facility] ORS;  Service: Vascular;  Laterality: Right;   CERVICAL FUSION     C4-c7 on Mar 14, 2020   COLONOSCOPY  2013 ?   COLONOSCOPY N/A 09/09/2021   Procedure: COLONOSCOPY;  Surgeon: Jaynie Collins, DO;  Location: [facility];  Service: Gastroenterology;  Laterality: N/A;   CORONARY ANGIOPLASTY     ESOPHAGOGASTRODUODENOSCOPY N/A 09/09/2021   Procedure: ESOPHAGOGASTRODUODENOSCOPY (EGD);  Surgeon: Jaynie Collins, DO;  Location: [facility] ENDOSCOPY;  Service: Gastroenterology;  Laterality: N/A;   FEMORAL-POPLITEAL BYPASS GRAFT Right 09/10/2016   Procedure: BYPASS GRAFT FEMORAL-POPLITEAL ARTERY ( BELOW KNEE );  Surgeon: Renford Dills, MD;  Location: [facility] ORS;  Service: Vascular;  Laterality: Right;   FEMORAL-TIBIAL BYPASS GRAFT Right 03/23/2020   Procedure: BYPASS GRAFT RIGHT FEMORAL- DISTAL TIBIAL ARTERY WITH DISTAL FLOW GRAFT;  Surgeon: Renford Dills, MD;  Location: [facility] ORS;  Service: Vascular;  Laterality: Right;   LEFT HEART CATH AND CORONARY ANGIOGRAPHY Left 05/16/2021   Procedure: LEFT HEART CATH AND CORONARY ANGIOGRAPHY;  Surgeon: Armando Reichert, MD;  Location: [facility] INVASIVE CV LAB;  Service: Cardiovascular;  Laterality: Left;   LOWER EXTREMITY ANGIOGRAPHY Right 11/18/2016    Procedure: Lower Extremity Angiography;  Surgeon: Renford Dills, MD;  Location: [facility];  Service: Cardiovascular;  Laterality: Right;   LOWER EXTREMITY ANGIOGRAPHY Right 12/09/2016   Procedure: Lower Extremity Angiography;  Surgeon: Renford Dills, MD;  Location: [facility];  Service: Cardiovascular;  Laterality: Right;   LOWER EXTREMITY ANGIOGRAPHY Right 11/15/2019   Procedure: LOWER EXTREMITY ANGIOGRAPHY;  Surgeon: Renford Dills, MD;  Location: [facility];  Service: Cardiovascular;  Laterality: Right;   LOWER EXTREMITY ANGIOGRAPHY Right 12/01/2019   Procedure: Lower Extremity Angiography;  Surgeon: Annice Needy, MD;  Location: [facility];  Service: Cardiovascular;  Laterality: Right;   LOWER EXTREMITY ANGIOGRAPHY Right 12/02/2019   Procedure: LOWER EXTREMITY ANGIOGRAPHY;  Surgeon: Renford Dills, MD;  Location: [facility];  Service: Cardiovascular;  Laterality: Right;   LOWER EXTREMITY ANGIOGRAPHY Right 03/23/2020   Procedure: Lower Extremity Angiography;  Surgeon: Renford Dills, MD;  Location: [facility];  Service: Cardiovascular;  Laterality: Right;   LOWER EXTREMITY ANGIOGRAPHY Right 09/25/2021   Procedure: Lower Extremity Angiography;  Surgeon: Renford Dills, MD;  Location: [facility];  Service: Cardiovascular;  Laterality: Right;   LOWER EXTREMITY ANGIOGRAPHY Right 09/26/2021   Procedure: Lower Extremity Angiography;  Surgeon: Annice Needy, MD;  Location: [facility];  Service: Cardiovascular;  Laterality: Right;   LOWER EXTREMITY ANGIOGRAPHY Right 04/08/2022   Procedure: Lower Extremity Angiography;  Surgeon: Renford Dills, MD;  Location: [facility];  Service: Cardiovascular;  Laterality: Right;   LOWER EXTREMITY INTERVENTION  11/18/2016   Procedure: Lower Extremity Intervention;  Surgeon: Renford Dills, MD;  Location: [facility];  Service: Cardiovascular;;    PERIPHERAL VASCULAR CATHETERIZATION Right 01/02/2015   Procedure: Lower Extremity Angiography;  Surgeon: Renford Dills, MD;  Location: [facility];  Service: Cardiovascular;  Laterality: Right;   PERIPHERAL VASCULAR CATHETERIZATION Right 06/18/2015   Procedure: Lower Extremity Angiography;  Surgeon: Annice Needy, MD;  Location: [facility];  Service: Cardiovascular;  Laterality: Right;   PERIPHERAL VASCULAR CATHETERIZATION  06/18/2015   Procedure: Lower Extremity Intervention;  Surgeon: Annice Needy, MD;  Location: [facility];  Service: Cardiovascular;;   PERIPHERAL VASCULAR CATHETERIZATION N/A 10/09/2015   Procedure: Abdominal Aortogram w/Lower Extremity;  Surgeon: Renford Dills, MD;  Location: [facility];  Service: Cardiovascular;  Laterality: N/A;   PERIPHERAL VASCULAR CATHETERIZATION  10/09/2015   Procedure: Lower Extremity Intervention;  Surgeon: Renford Dills, MD;  Location: [facility];  Service: Cardiovascular;;   PERIPHERAL VASCULAR CATHETERIZATION Right 10/10/2015   Procedure: Lower Extremity Angiography;  Surgeon: Annice Needy, MD;  Location: [facility];  Service: Cardiovascular;  Laterality: Right;   PERIPHERAL VASCULAR CATHETERIZATION Right 10/10/2015   Procedure: Lower Extremity Intervention;  Surgeon: Annice Needy, MD;  Location: [facility];  Service: Cardiovascular;  Laterality: Right;   PERIPHERAL VASCULAR CATHETERIZATION Right 12/03/2015   Procedure: Lower Extremity Angiography;  Surgeon: Annice Needy, MD;  Location: [facility];  Service: Cardiovascular;  Laterality: Right;   PERIPHERAL VASCULAR CATHETERIZATION Right 12/04/2015   Procedure: Lower Extremity Angiography;  Surgeon: Annice Needy, MD;  Location: [facility];  Service: Cardiovascular;  Laterality: Right;   PERIPHERAL VASCULAR CATHETERIZATION  12/04/2015   Procedure: Lower Extremity Intervention;  Surgeon: Annice Needy, MD;  Location:  [facility];  Service: Cardiovascular;;   PERIPHERAL VASCULAR CATHETERIZATION Right 06/30/2016   Procedure: Lower Extremity Angiography;  Surgeon: Annice Needy, MD;  Location: [facility];  Service: Cardiovascular;  Laterality: Right;   PERIPHERAL VASCULAR CATHETERIZATION Right 06/25/2016   Procedure: Lower Extremity Angiography;  Surgeon: Renford Dills, MD;  Location: [facility];  Service: Cardiovascular;  Laterality: Right;   PERIPHERAL VASCULAR CATHETERIZATION Right 07/01/2016   Procedure: Lower Extremity Angiography;  Surgeon: Renford Dills, MD;  Location: [facility];  Service: Cardiovascular;  Laterality: Right;   PERIPHERAL VASCULAR CATHETERIZATION  07/01/2016   Procedure: Lower Extremity Intervention;  Surgeon: Renford Dills, MD;  Location: [facility];  Service: Cardiovascular;;   PERIPHERAL VASCULAR CATHETERIZATION Right 08/01/2016   Procedure: Lower Extremity Angiography;  Surgeon: Renford Dills, MD;  Location: [facility];  Service: Cardiovascular;  Laterality: Right;   PORTA CATH INSERTION N/A 10/05/2020   Procedure: PORTA CATH INSERTION;  Surgeon: Renford Dills, MD;  Location: [facility];  Service: Cardiovascular;  Laterality: N/A;   stent placement in right leg Right    VIDEO BRONCHOSCOPY N/A 09/14/2020   Procedure: VIDEO BRONCHOSCOPY WITH FLUORO;  Surgeon: Salena Saner, MD;  Location: [facility] ORS;  Service: Cardiopulmonary;  Laterality: N/A;   VIDEO BRONCHOSCOPY WITH ENDOBRONCHIAL ULTRASOUND N/A 09/14/2020   Procedure: VIDEO BRONCHOSCOPY WITH ENDOBRONCHIAL ULTRASOUND;  Surgeon: Salena Saner, MD;  Location: [facility];  Service: Cardiopulmonary;  Laterality: N/A;   HPI:  68 yo male presents to [facility] ED 1/31 after a fall and seizure like activity. Transferred to [facility] for stroke work up. 1/28 pt reported increased R-sided weakness and slurred speech.MRI -  minimally increased size of 2 left cerebral  metastases since 08/05/2023. Worsening, extensive vasogenic edema with 7 mm of rightward midline shift.  EEG suggestive of cortical dysfunction arising from left temporal region likely secondary to structural abnormality/ tumor. 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   Assessment / Plan / Recommendation Clinical Impression  Pt presents with cognitive-linguistic deficits in the areas of short term recall, executive functions, naming (word-finding) and speech intelligibility (dysarhria). Pt reports that his speech has been a bit unintelligible since initial brain metastasis and does not think it is any worse than normal. His articulatory precision significantly decreases with longer utterances and he is aware of this. During conversational speech he is understood 60-70% of the time. Repetitions and pausing improve this. Intermittent mild word-finding was noted during informal conversation and confrontational naming task. With time and independent circumlocution, pt produced/named word in majority of instances.  He is oriented x4 and immediate recall of listed items is 100%. He demonstrated reduced delayed recall (4/5) of listed items and executive functions during mathamatical task. Recommend SLP f/u to treat for above deficits as he does endorse worsening of word-finding difficulties and recall memory.    SLP Assessment  SLP Recommendation/Assessment: Patient needs continued Speech Lanaguage Pathology Services SLP Visit Diagnosis: Cognitive communication deficit (R41.841);Dysarthria and anarthria (R47.1)    Recommendations for follow up therapy are one component of a multi-disciplinary discharge planning process, led by the attending physician.  Recommendations may be updated based on patient status, additional functional criteria and insurance authorization.    Follow  Up Recommendations  Skilled nursing-short term rehab (<3 hours/day)    Assistance Recommended at Discharge  PRN  Functional Status Assessment Patient has had a recent decline in their functional status and demonstrates the ability to make significant improvements in function in a reasonable and predictable amount of time.  Frequency and Duration min 2x/week  2 weeks      SLP Evaluation Cognition  Overall Cognitive Status: Impaired/Different from baseline Arousal/Alertness: Awake/alert Orientation Level: Oriented X4 Year: 2025 Month: February Day of Week: Correct Attention: Sustained Sustained Attention: Appears intact Memory: Impaired Memory Impairment: Retrieval deficit;Decreased recall of new information;Decreased short term memory Decreased Short Term Memory: Verbal basic Awareness: Appears intact Problem Solving: Impaired Problem Solving Impairment: Verbal complex Executive Function: Self Monitoring;Self Correcting Self Monitoring: Impaired Self Monitoring Impairment: Verbal complex Self Correcting: Impaired Self Correcting Impairment: Verbal complex       Comprehension  Auditory Comprehension Overall Auditory Comprehension: Appears within functional limits for tasks assessed Visual Recognition/Discrimination Discrimination: Not tested Reading Comprehension Reading Status: Not tested    Expression Expression Primary Mode of Expression: Verbal Verbal Expression Overall Verbal Expression: Impaired Initiation: No impairment Level of Generative/Spontaneous Verbalization: Conversation Naming: Impairment Confrontation: Impaired Effective Techniques: Semantic cues Written Expression Written Expression: Not tested   Oral / Motor  Oral Motor/Sensory Function Overall Oral Motor/Sensory Function: Generalized oral weakness Motor Speech Overall Motor Speech: Impaired at baseline Respiration: Within functional limits Phonation: Normal Articulation: Impaired Level of  Impairment: Conversation Intelligibility: Intelligibility reduced Word: 75-100% accurate Phrase: 50-74% accurate Sentence: 50-74% accurate Conversation: 50-74% accurate Motor Planning: Witnin functional limits Motor Speech Errors: Unaware Effective Techniques: Slow rate            Avie Echevaria, MA, CCC-SLP Acute Rehabilitation Services Office Number: [phone]  Paulette Blanch 08/22/2023, 4:20 PM

## 2023-08-22 NOTE — Plan of Care (Signed)
Patient up to chair with PT/OT.  Patients doing well.  Right side control improving.  Has more strength and ability with right arm.  PT recommending inpatient rehab.

## 2023-08-22 NOTE — Evaluation (Signed)
Occupational Therapy Evaluation Patient Details Name: Joshua [name] MRN: [id number] DOB: March 31, 1956 Today's Date: 08/22/2023   History of Present Illness 68 yo male presents to [facility] ED 1/31 after a fall and seizure like activity. Transferred to [facility] for stroke work up. 1/28 pt reported increased R-sided weakness and slurred speech.MRI -  minimally increased size of 2 left cerebral metastases since 08/05/2023. Worsening, extensive vasogenic edema with 7 mm of rightward midline shift.  EEG suggestive of cortical dysfunction arising from left temporal region likely secondary to structural abnormality/ tumor. 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   Clinical Impression   PTA pt lives alone independently @ RW level in the home and [facility] outside/in the community and drives. Pt endorses recent falls and increased weakness and coordination problems with his R side. Pt impulsive at times with impaired on-line awareness and R sided weakness, requiring Mod A for stand pivot transfer and Mod to Max A with ADL tasks @ RW level. Pt states his "Aunt" can assist at DC if needed. Patient will benefit from intensive inpatient follow-up therapy, >3 hours/day to maximize functional level of independence and facilitate a safe DC home. DC home @ wc level may be more realistic. Acute OT to follow.       If plan is discharge home, recommend the following: A lot of help with walking and/or transfers;A lot of help with bathing/dressing/bathroom;Assistance with cooking/housework;Direct supervision/assist for medications management;Direct supervision/assist for financial management;Assist for transportation;Help with stairs or ramp for entrance    Functional Status Assessment  Patient has had a recent decline in their functional status and demonstrates the ability to make significant  improvements in function in a reasonable and predictable amount of time.  Equipment Recommendations  None recommended by OT    Recommendations for Other Services Rehab consult Palliative Consult     Precautions / Restrictions Precautions Precautions: Fall Precaution Comments: R inattention; impulsive      Mobility Bed Mobility Overal bed mobility: Needs Assistance Bed Mobility: Supine to Sit     Supine to sit: Mod assist, HOB elevated, Used rails          Transfers Overall transfer level: Needs assistance Equipment used: Rolling walker (2 wheels) Transfers: Sit to/from Stand, Bed to chair/wheelchair/BSC Sit to Stand: Min assist     Step pivot transfers: Mod assist, From elevated surface     General transfer comment: R LE buckling; R hand requires physical assist to maintain grasp on RW      Balance Overall balance assessment: History of Falls, Needs assistance   Sitting balance-Leahy Scale: Fair       Standing balance-Leahy Scale: Poor                             ADL either performed or assessed with clinical judgement   ADL Overall ADL's : Needs assistance/impaired Eating/Feeding: Set up;Sitting   Grooming: Set up;Sitting   Upper Body Bathing: Minimal assistance;Sitting   Lower Body Bathing: Moderate assistance;Sit to/from stand   Upper Body Dressing : Moderate assistance   Lower Body Dressing: Maximal assistance;Sit to/from stand   Toilet Transfer: Moderate assistance;Rolling walker (2 wheels) (simulated)   Toileting- Clothing Manipulation and Hygiene: Moderate assistance       Functional mobility during ADLs: Moderate assistance;Rolling walker (2 wheels);Cueing for safety General ADL Comments: began educating on compensatory 1 handed  technique for donning sock in figure four position     Vision Baseline Vision/History: 1 Wears glasses Ability to See in Adequate Light: 0 Adequate Vision Assessment?: Vision impaired- to be  further tested in functional context Additional Comments: decreased visual attention     Perception Perception: Impaired Preception Impairment Details: Inattention/Neglect (R) Perception-Other Comments: frequently dropping items R hand; cues for positioning of R hand during trnasfers to reduce risk of hyperflexion of wrist; hadn falling off RW   Praxis Praxis:  (appears intact)       Pertinent Vitals/Pain Pain Assessment Pain Assessment: No/denies pain     Extremity/Trunk Assessment Upper Extremity Assessment Upper Extremity Assessment: Right hand dominant;RUE deficits/detail RUE Deficits / Details: stronger proximally; isolated joint movement; gross grasp/release; no in-hand manipulation skiolls; able to grossly oppose thumb to index finger; difficulty using as a functional assist RUE Sensation: decreased proprioception RUE Coordination: decreased fine motor;decreased gross motor   Lower Extremity Assessment Lower Extremity Assessment: Defer to PT evaluation   Cervical / Trunk Assessment Cervical / Trunk Assessment: Normal (hx of back surgery)   Communication Communication Communication: Difficulty communicating thoughts/reduced clarity of speech Cueing Techniques: Tactile cues;Verbal cues   Cognition Arousal: Alert Behavior During Therapy: Impulsive Overall Cognitive Status: Impaired/Different from baseline Area of Impairment: Attention, Memory, Safety/judgement, Awareness, Problem solving                   Current Attention Level: Selective Memory: Decreased short-term memory   Safety/Judgement: Decreased awareness of safety, Decreased awareness of deficits Awareness: Emergent   General Comments: unsure of baseline; inattention noted with decreased on-line awareness     General Comments       Exercises Exercises: Other exercises Other Exercises Other Exercises: issued green foam block to work on R grip/pinch strengthening   Shoulder Instructions       Home Living Family/patient expects to be discharged to:: Private residence Living Arrangements: Alone Available Help at Discharge: Family;Friend(s);Available PRN/intermittently Type of Home: House Home Access: Ramped entrance     Home Layout: One level     Bathroom Shower/Tub: Tub/shower unit;Walk-in shower   Bathroom Toilet: Handicapped height Bathroom Accessibility: Yes How Accessible: Accessible via walker Home Equipment: Rolling Walker (2 wheels);Wheelchair - manual;Shower seat;Grab bars - tub/shower;Hand held shower head;Cane - single point (walker tray)          Prior Functioning/Environment Prior Level of Function : Independent/Modified Independent;Driving             Mobility Comments: Uses RW in th ehouse and cane outside          OT Problem List: Decreased strength;Decreased range of motion;Decreased activity tolerance;Impaired balance (sitting and/or standing);Impaired vision/perception;Decreased coordination;Decreased cognition;Decreased safety awareness;Decreased knowledge of use of DME or AE;Impaired sensation;Impaired tone;Impaired UE functional use      OT Treatment/Interventions: Self-care/ADL training;Therapeutic exercise;Neuromuscular education;DME and/or AE instruction;Therapeutic activities;Cognitive remediation/compensation;Visual/perceptual remediation/compensation;Patient/family education;Balance training    OT Goals(Current goals can be found in the care plan section) Acute Rehab OT Goals Patient Stated Goal: to go to rehab and not a nursing home OT Goal Formulation: With patient Time For Goal Achievement: 09/05/23 Potential to Achieve Goals: Good  OT Frequency: Min 1X/week    Co-evaluation              AM-PAC OT "6 Clicks" Daily Activity     Outcome Measure Help from another person eating meals?: A Little Help from another person taking care of personal grooming?: A Little Help from another person toileting, which includes using  toliet, bedpan, or urinal?: A Lot Help from another person bathing (including washing, rinsing, drying)?: A Lot Help from another person to put on and taking off regular upper body clothing?: A Lot Help from another person to put on and taking off regular lower body clothing?: A Lot 6 Click Score: 14   End of Session Equipment Utilized During Treatment: Gait belt;Rolling walker (2 wheels) Nurse Communication: Mobility status;Precautions  Activity Tolerance: Patient tolerated treatment well Patient left: in chair;with call bell/phone within reach;with chair alarm set  OT Visit Diagnosis: Unsteadiness on feet (R26.81);Other abnormalities of gait and mobility (R26.89);Muscle weakness (generalized) (M62.81);History of falling (Z91.81);Other symptoms and signs involving cognitive function                Time: 9147-8295 OT Time Calculation (min): 35 min Charges:  OT General Charges $OT Visit: 1 Visit OT Evaluation $OT Eval Moderate Complexity: 1 Mod OT Treatments $Self Care/Home Management : 8-22 mins  Luisa Dago, OT/L   Acute OT Clinical Specialist Acute Rehabilitation Services Pager [phone] Office [phone]   [facility] 08/22/2023, 11:43 AM

## 2023-08-22 NOTE — Progress Notes (Signed)
NEUROLOGY CONSULT FOLLOW UP NOTE   Date of service: August 22, 2023 Patient Name: Joshua [name] MRN:  [id number] DOB:  [birth year]  Interval Hx/subjective  Patient has remained hemodynamically stable and afebrile.  Long-term EEG revealed no seizure activity and has been discontinued.  He is noted to be alert and oriented and able to converse fluently and easily without dysarthria. Vitals   Vitals:   08/22/23 0008 08/22/23 0411 08/22/23 0940 08/22/23 1308  BP: (!) 113/53 121/61 (!) 149/80 132/60  Pulse: 77 69 75 77  Resp: 18 18 18 18   Temp: 98.3 F (36.8 C) 98 F (36.7 C) 97.8 F (36.6 C) 97.8 F (36.6 C)  TempSrc: Oral Oral Oral Oral  SpO2: 97% 97% 98% 100%  Weight:      Height:         Body mass index is 29.83 kg/m.  Physical Exam   Constitutional: Appears well-developed and well-nourished.  Psych: Affect appropriate to situation.  Eyes: No scleral injection.  HENT: No OP obstrucion. Head: Normocephalic.  Respiratory: Effort normal, non-labored breathing.  Skin: WDI.   Neurologic Examination    NEURO:  Mental Status: Alert and oriented to person place time and situation, able to discuss current illness without problems Speech/Language: speech is without dysarthria or aphasia.    Cranial Nerves:  II: PERRL.  III, IV, VI: EOMI. Eyelids elevate symmetrically.  VII: Smile is symmetrical.  VIII: hearing intact to voice. IX, X: Phonation is normal.  XII: tongue is midline without fasciculations. Motor: Able to move bilateral upper extremities with goodness, symmetrical antigravity strength, able to lift left lower extremity off the chair, able to move right lower extremity but not against gravity Tone: is normal and bulk is normal Sensation- Intact to light touch bilaterally.  Coordination: FTN intact bilaterally Gait- deferred   Medications  Current Facility-Administered Medications:    acetaminophen (TYLENOL) tablet 650 mg, 650 mg, Oral, Q4H PRN, 650  mg at 08/21/23 1227 **OR** acetaminophen (TYLENOL) suppository 650 mg, 650 mg, Rectal, Q4H PRN, Katrinka Blazing, Rondell A, MD   aspirin EC tablet 81 mg, 81 mg, Oral, Daily, Katrinka Blazing, Rondell A, MD, 81 mg at 08/22/23 4098   Chlorhexidine Gluconate Cloth 2 % PADS 6 each, 6 each, Topical, Daily, Briant Cedar, MD, 6 each at 08/22/23 1441   clopidogrel (PLAVIX) tablet 75 mg, 75 mg, Oral, Daily, Katrinka Blazing, Rondell A, MD, 75 mg at 08/22/23 1191   dexamethasone (DECADRON) injection 4 mg, 4 mg, Intravenous, Q12H, Erick Blinks, MD, 4 mg at 08/22/23 0927   enoxaparin (LOVENOX) injection 40 mg, 40 mg, Subcutaneous, Q24H, Smith, Rondell A, MD, 40 mg at 08/21/23 2118   lactated ringers infusion, , Intravenous, Continuous, Briant Cedar, MD, Last Rate: 75 mL/hr at 08/22/23 1439, New Bag at 08/22/23 1439   levETIRAcetam (KEPPRA) tablet 1,000 mg, 1,000 mg, Oral, BID, 1,000 mg at 08/22/23 0927 **OR** levETIRAcetam (KEPPRA) IVPB 1000 mg/100 mL premix, 1,000 mg, Intravenous, BID, Erick Blinks, MD, Last Rate: 400 mL/hr at 08/22/23 0604, Infusion Verify at 08/22/23 0604   levothyroxine (SYNTHROID) tablet 125 mcg, 125 mcg, Oral, QAC breakfast, Madelyn Flavors A, MD, 125 mcg at 08/22/23 0656   LORazepam (ATIVAN) injection 1-2 mg, 1-2 mg, Intravenous, Q2H PRN, Smith, Rondell A, MD   ondansetron (ZOFRAN) tablet 4 mg, 4 mg, Oral, Q6H PRN **OR** ondansetron (ZOFRAN) injection 4 mg, 4 mg, Intravenous, Q6H PRN, Katrinka Blazing, Rondell A, MD   Oral care mouth rinse, 15 mL, Mouth Rinse, PRN, Briant Cedar, MD  rosuvastatin (CRESTOR) tablet 40 mg, 40 mg, Oral, Daily, Katrinka Blazing, Rondell A, MD, 40 mg at 08/22/23 1610   sodium chloride flush (NS) 0.9 % injection 10-40 mL, 10-40 mL, Intracatheter, Q12H, Briant Cedar, MD, 10 mL at 08/22/23 1441   sodium chloride flush (NS) 0.9 % injection 10-40 mL, 10-40 mL, Intracatheter, PRN, Briant Cedar, MD  Facility-Administered Medications Ordered in Other Encounters:     albuterol (PROVENTIL) (2.5 MG/3ML) 0.083% nebulizer solution 2.5 mg, 2.5 mg, Nebulization, Once, Salena Saner, MD   heparin lock flush 100 unit/mL, 500 Units, Intracatheter, Once PRN, [name], MD  Labs and Diagnostic Imaging   CBC:  Recent Labs  Lab 08/20/23 2255 08/21/23 1736  WBC 6.9 10.0  NEUTROABS 5.7  --   HGB 13.5 13.0  HCT 42.6 39.9  MCV 85.7 83.6  PLT 225 198    Basic Metabolic Panel:  Lab Results  Component Value Date   NA 146 (H) 08/22/2023   K 4.1 08/22/2023   CO2 25 08/22/2023   GLUCOSE 111 (H) 08/22/2023   BUN 20 08/22/2023   CREATININE 1.08 08/22/2023   CALCIUM 9.2 08/22/2023   GFRNONAA >60 08/22/2023   GFRAA >60 03/30/2020   Lipid Panel:  Lab Results  Component Value Date   LDLCALC 64 11/21/2020   HgbA1c:  Lab Results  Component Value Date   HGBA1C 6.3 (H) 06/02/2023   Urine Drug Screen: No results found for: "LABOPIA", "COCAINSCRNUR", "LABBENZ", "AMPHETMU", "THCU", "LABBARB"  Alcohol Level No results found for: "ETH" INR  Lab Results  Component Value Date   INR 1.0 08/20/2023   APTT  Lab Results  Component Value Date   APTT 28 08/20/2023   CT head: Increasing left frontoparietal edema and mass effect with 9 mm of right midline shift, likely related to worsening metastatic disease  MRI Brain(Personally reviewed): Stable to minimally increased size of 2 left cerebral metastases, worsening extensive vasogenic edema with 7 mm of rightward midline shift, unchanged size of small right occipital left cerebellar metastases  Continuous EEG 1/31:  Intermittent slow, left temporal region, suggestive of cortical dysfunction arising from left temporal region likely secondary to underlying tumor, no seizures or epileptiform discharges seen  Assessment   Joshua [name] is a 68 y.o. 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  In the ED, he was given Ativan and loaded with Keppra and Vimpat, with improvement of twitching.  No seizures were seen on long-term EEG, and it has now been discontinued.  On exam today, he displays baseline right-sided weakness and can converse fluently but slowly.  Recommendations  -Continue Keppra 1000 mg twice daily - Continue Decadron 4 mg every 12 hours - Follow-up with outpatient oncologist and neurologist - Neurology will sign off, please call with questions or concerns. ______________________________________________________________________  Patient seen by NP with MD, MD to edit note as needed.  Signed, Cortney E Ernestina Columbia, NP [facility]    Attending Neurohospitalist Addendum Patient seen and examined with APP/Resident. Agree with the history and physical as documented above. Agree with the plan as documented, which I helped formulate. I have edited the note above to reflect my full findings and recommendations. I have independently reviewed the chart, obtained history, review of systems and examined the patient.I have personally reviewed pertinent head/neck/spine imaging (CT/MRI).  Please feel free to call with any questions.  -- Bing Neighbors, MD [facility] [phone]  If 7pm- 7am, please page neurology on call as listed in [facility].

## 2023-08-22 NOTE — Evaluation (Signed)
Physical Therapy Evaluation Patient Details Name: Joshua [name] MRN: [id number] DOB: Oct 30, 1955 Today's Date: 08/22/2023  History of Present Illness  68 yo male presents to [facility] ED 1/31 after a fall and seizure like activity. Transferred to [facility] for stroke work up. 1/28 pt reported increased R-sided weakness and slurred speech.MRI -  minimally increased size of 2 left cerebral metastases since 08/05/2023. Worsening, extensive vasogenic edema with 7 mm of rightward midline shift.  EEG suggestive of cortical dysfunction arising from left temporal region likely secondary to structural abnormality/ tumor. NWG:NFAOZHY cell carcinoma of the lung with brain metastasis (s/p chemo, radiation, and Imfinzi immunotherapy), hypertension, PVD, COPD, hypothyroidism, and prior tobacco abuse.  Clinical Impression  PTA pt living alone in single story home with ramped entrance. Pt reports independence with mobility using cane and RW, but also endorses multiple falls. Pt independent with ADLS, and iADLs, driving. Pt is currently limited in safe mobility by increased R sided weakness, and associated balance deficits. Pt is currently min-modA for bed mobility and transfers. Patient will benefit from intensive inpatient follow-up therapy, >3 hours/day. PT will continue to follow acutely.       If plan is discharge home, recommend the following: Two people to help with walking and/or transfers;A lot of help with bathing/dressing/bathroom;Assistance with cooking/housework;Direct supervision/assist for medications management;Assist for transportation;Help with stairs or ramp for entrance   Can travel by private vehicle    no    Equipment Recommendations Wheelchair (measurements PT);Wheelchair cushion (measurements PT)     Functional Status Assessment Patient has had a recent decline in their functional status and demonstrates the ability to make significant improvements in function in a reasonable and predictable  amount of time.     Precautions / Restrictions Precautions Precautions: Fall Precaution Comments: R inattention; impulsive      Mobility  Bed Mobility Overal bed mobility: Needs Assistance Bed Mobility: Sit to Supine       Sit to supine: Supervision, Used rails   General bed mobility comments: increased time, effort and use of momentum to navigate LE back into bed    Transfers Overall transfer level: Needs assistance Equipment used: Rolling walker (2 wheels) Transfers: Sit to/from Stand, Bed to chair/wheelchair/BSC Sit to Stand: Min assist   Step pivot transfers: Min assist      Lateral/Scoot Transfers: Min assist (for lateral stepping to L) General transfer comment: mod A from low recliner and minA from elevated bed surface for bracing knee to keep from buckling while coming to standing, min A and maximal cuing for sequencing to take pivotal steps from recliner to bed and for lateral stepping up the side of the bed    Ambulation/Gait               General Gait Details: deferred for safety        Balance Overall balance assessment: History of Falls, Needs assistance   Sitting balance-Leahy Scale: Fair       Standing balance-Leahy Scale: Poor                               Pertinent Vitals/Pain Pain Assessment Pain Assessment: No/denies pain    Home Living Family/patient expects to be discharged to:: Private residence Living Arrangements: Alone Available Help at Discharge: Family;Friend(s);Available PRN/intermittently Type of Home: House Home Access: Ramped entrance       Home Layout: One level Home Equipment: Agricultural consultant (2 wheels);Wheelchair - manual;Shower seat;Grab bars -  tub/shower;Hand held shower head;Cane - single point (walker tray)      Prior Function Prior Level of Function : Independent/Modified Independent;Driving             Mobility Comments: Uses RW in th ehouse and cane outside       Extremity/Trunk  Assessment   Upper Extremity Assessment Upper Extremity Assessment: Defer to OT evaluation RUE Deficits / Details: stronger proximally; isolated joint movement; gross grasp/release; no in-hand manipulation skiolls; able to grossly oppose thumb to index finger; difficulty using as a functional assist RUE Sensation: decreased proprioception RUE Coordination: decreased fine motor;decreased gross motor    Lower Extremity Assessment Lower Extremity Assessment: RLE deficits/detail RLE Deficits / Details: stronger proximally, ankle eversion with lifting LE RLE Sensation: decreased proprioception RLE Coordination: decreased fine motor;decreased gross motor    Cervical / Trunk Assessment Cervical / Trunk Assessment: Normal (hx of back surgery)  Communication   Communication Communication: Difficulty communicating thoughts/reduced clarity of speech Cueing Techniques: Verbal cues;Gestural cues;Tactile cues  Cognition Arousal: Alert Behavior During Therapy: Impulsive Overall Cognitive Status: Impaired/Different from baseline Area of Impairment: Attention, Memory, Safety/judgement, Awareness, Problem solving                   Current Attention Level: Selective Memory: Decreased short-term memory   Safety/Judgement: Decreased awareness of safety, Decreased awareness of deficits Awareness: Emergent   General Comments: unsure of baseline; inattention noted with decreased on-line awareness        General Comments General comments (skin integrity, edema, etc.): VSS on RA    Exercises General Exercises - Lower Extremity Hip Flexion/Marching: AROM, Both, 5 reps, Standing Other Exercises Other Exercises: lateral shifting onto weaker R LE x10   Assessment/Plan    PT Assessment Patient needs continued PT services  PT Problem List Decreased strength;Decreased range of motion;Decreased balance;Decreased mobility;Decreased coordination       PT Treatment Interventions DME  instruction;Gait training;Functional mobility training;Therapeutic activities;Therapeutic exercise;Balance training;Neuromuscular re-education;Cognitive remediation;Patient/family education    PT Goals (Current goals can be found in the Care Plan section)  Acute Rehab PT Goals PT Goal Formulation: With patient Time For Goal Achievement: 09/05/23 Potential to Achieve Goals: Fair    Frequency Min 1X/week        AM-PAC PT "6 Clicks" Mobility  Outcome Measure Help needed turning from your back to your side while in a flat bed without using bedrails?: A Little Help needed moving from lying on your back to sitting on the side of a flat bed without using bedrails?: A Lot Help needed moving to and from a bed to a chair (including a wheelchair)?: A Little Help needed standing up from a chair using your arms (e.g., wheelchair or bedside chair)?: A Lot Help needed to walk in hospital room?: Total Help needed climbing 3-5 steps with a railing? : Total 6 Click Score: 12    End of Session Equipment Utilized During Treatment: Gait belt Activity Tolerance: Patient tolerated treatment well Patient left: in bed;with call bell/phone within reach Nurse Communication: Mobility status PT Visit Diagnosis: Unsteadiness on feet (R26.81);Repeated falls (R29.6);Muscle weakness (generalized) (M62.81);Difficulty in walking, not elsewhere classified (R26.2);Other symptoms and signs involving the nervous system (R29.898)    Time: 1610-9604 PT Time Calculation (min) (ACUTE ONLY): 21 min   Charges:     PT Treatments $Therapeutic Activity: 8-22 mins PT General Charges $$ ACUTE PT VISIT: 1 Visit         Dellene Mcgroarty B. Beverely Risen PT, DPT Acute Rehabilitation Services Please  use secure chat or  Call Office [phone]   Elon Alas [facility] 08/22/2023, 2:32 PM

## 2023-08-22 NOTE — Progress Notes (Signed)
 LTM EEG disconnected - no skin breakdown at [facility].

## 2023-08-23 DIAGNOSIS — R569 Unspecified convulsions: Secondary | ICD-10-CM | POA: Diagnosis not present

## 2023-08-23 LAB — CBC WITH DIFFERENTIAL/PLATELET
Abs Immature Granulocytes: 0.05 10*3/uL (ref 0.00–0.07)
Basophils Absolute: 0 10*3/uL (ref 0.0–0.1)
Basophils Relative: 0 %
Eosinophils Absolute: 0 10*3/uL (ref 0.0–0.5)
Eosinophils Relative: 0 %
HCT: 37 % — ABNORMAL LOW (ref 39.0–52.0)
Hemoglobin: 11.9 g/dL — ABNORMAL LOW (ref 13.0–17.0)
Immature Granulocytes: 1 %
Lymphocytes Relative: 13 %
Lymphs Abs: 0.7 10*3/uL (ref 0.7–4.0)
MCH: 27.2 pg (ref 26.0–34.0)
MCHC: 32.2 g/dL (ref 30.0–36.0)
MCV: 84.7 fL (ref 80.0–100.0)
Monocytes Absolute: 0.3 10*3/uL (ref 0.1–1.0)
Monocytes Relative: 6 %
Neutro Abs: 4.7 10*3/uL (ref 1.7–7.7)
Neutrophils Relative %: 80 %
Platelets: 184 10*3/uL (ref 150–400)
RBC: 4.37 MIL/uL (ref 4.22–5.81)
RDW: 16.7 % — ABNORMAL HIGH (ref 11.5–15.5)
WBC: 5.8 10*3/uL (ref 4.0–10.5)
nRBC: 0 % (ref 0.0–0.2)

## 2023-08-23 LAB — BASIC METABOLIC PANEL
Anion gap: 9 (ref 5–15)
BUN: 22 mg/dL (ref 8–23)
CO2: 26 mmol/L (ref 22–32)
Calcium: 8.5 mg/dL — ABNORMAL LOW (ref 8.9–10.3)
Chloride: 106 mmol/L (ref 98–111)
Creatinine, Ser: 0.93 mg/dL (ref 0.61–1.24)
GFR, Estimated: >60 mL/min (ref >60)
Glucose, Bld: 151 mg/dL — ABNORMAL HIGH (ref 70–99)
Potassium: 3.8 mmol/L (ref 3.5–5.1)
Sodium: 141 mmol/L (ref 135–145)

## 2023-08-23 NOTE — Plan of Care (Signed)
  Problem: Education: Goal: Knowledge of General Education information will improve Description: Including pain rating scale, medication(s)/side effects and non-pharmacologic comfort measures Outcome: Progressing   Problem: Health Behavior/Discharge Planning: Goal: Ability to manage health-related needs will improve Outcome: Progressing   Problem: Clinical Measurements: Goal: Ability to maintain clinical measurements within normal limits will improve Outcome: Progressing Goal: Will remain free from infection Outcome: Progressing Goal: Diagnostic test results will improve Outcome: Progressing Goal: Respiratory complications will improve Outcome: Progressing Goal: Cardiovascular complication will be avoided Outcome: Progressing   Problem: Activity: Goal: Risk for activity intolerance will decrease Outcome: Progressing   Problem: Nutrition: Goal: Adequate nutrition will be maintained Outcome: Progressing   Problem: Coping: Goal: Level of anxiety will decrease Outcome: Progressing   Problem: Elimination: Goal: Will not experience complications related to bowel motility Outcome: Progressing Goal: Will not experience complications related to urinary retention Outcome: Progressing   Problem: Pain Managment: Goal: General experience of comfort will improve and/or be controlled Outcome: Progressing   Problem: Safety: Goal: Ability to remain free from injury will improve Outcome: Progressing   Problem: Skin Integrity: Goal: Risk for impaired skin integrity will decrease Outcome: Progressing   Problem: Education: Goal: Expressions of having a comfortable level of knowledge regarding the disease process will increase Outcome: Progressing   Problem: Coping: Goal: Ability to adjust to condition or change in health will improve Outcome: Progressing Goal: Ability to identify appropriate support needs will improve Outcome: Progressing   Problem: Health Behavior/Discharge  Planning: Goal: Compliance with prescribed medication regimen will improve Outcome: Progressing   Problem: Medication: Goal: Risk for medication side effects will decrease Outcome: Progressing   Problem: Clinical Measurements: Goal: Complications related to the disease process, condition or treatment will be avoided or minimized Outcome: Progressing Goal: Diagnostic test results will improve Outcome: Progressing   Problem: Safety: Goal: Verbalization of understanding the information provided will improve Outcome: Progressing   Problem: Self-Concept: Goal: Level of anxiety will decrease Outcome: Progressing Goal: Ability to verbalize feelings about condition will improve Outcome: Progressing

## 2023-08-23 NOTE — Progress Notes (Signed)
PROGRESS NOTE  Joshua [name]:[id number] DOB: [birth year] DOA: 08/21/2023 PCP: Jacky Kindle, FNP (Inactive)  HPI/Recap of past 24 hours: Joshua [name] is a 68 y.o. 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 At baseline patient has right-sided weakness and slurred speech which patient noticed has been worsening for the past week.  Patient discussed with Dr. Barbaraann Cao who prescribed p.o. Decadron, despite taking it, had no improvement, which may have led to his fall. Patient had been noticed to have jerking movements on his right side. In the ED at [facility] patient was seen as a code stroke. CT scan of the head noted increasing left frontal parietal edema and mass effect with 9 mm right midline shift likely relating to worsening metastatic disease.  Twitching improved after patient was given Keppra, Ativan, Vimpat, and Decadron.  Neurology consulted, recommended continuous EEG monitoring.  Patient admitted for further management.    Today, patient denies any new complaints.  Patient is being evaluated for CIR.   Assessment/Plan: Principal Problem:   Seizure (HCC) Active Problems:   Cerebral edema (HCC)   Neoplasm causing mass effect and brain compression on adjacent structures (HCC)   Squamous cell carcinoma of right lung (HCC)   Malignant neoplasm metastatic to brain [facility])   PVD (peripheral vascular disease) (HCC)   Hypothyroidism due to medication   Hyperlipidemia, unspecified   ?Seizure Fall Cerebral edema with mass effect No further episodes of jerking movements noted CT of the brain revealed increasing left frontal parietal edema and mass effect with 9 mm right midline shift likely relating to worsening metastatic disease  MRI brain w/wo contrast showed stable  to minimally increased size of 2 left cerebral metastases since 08/05/2023. Worsening, extensive vasogenic edema with 7 mm of rightward midline shift.  No new metastasis Continuous EEG monitoring suggestive of cortical dysfunction arising from left temporal region, no seizure or epileptiform discharges were seen Neurology consulted, appreciate recs Oncologist Dr. Barbaraann Cao, ok with current management Neurochecks, seizure precautions Continue IV Decadron, Keppra PT/OT/speech to evaluate- recommend CIR   Malignant neoplasm metastatic to brain Squamous cell carcinoma of the right lung Patient is currently being followed by Dr. Barbaraann Cao previously treated with chemotherapy and radiation treatments Patient also followed by Dr. Jayme Cloud of pulmonology Dr. Barbaraann Cao of medical oncology added to treatment team and informed  Hypernatremia Improved S/p IV fluid   Peripheral vascular disease Patient has a history of claudication status post prior bypass grafting and followed by Dr. Gilda Crease of vascular surgery. Continue aspirin, Plavix, and statin   COPD Stable Albuterol nebs as needed for shortness of breath/wheezing   Hyperlipidemia Continue Crestor   Hypothyroidism TSH WNL Continue levothyroxine      Estimated body mass index is 29.83 kg/m as calculated from the following:   Height as of this encounter: 5\' 11"  (1.803 m).   Weight as of this encounter: 97 kg.     Code Status: Full  Family Communication: None at bedside  Disposition Plan: Status is: Inpatient Remains inpatient appropriate because: Level of care      Consultants: Neurology  Procedures: Continuous EEG  Antimicrobials: None  DVT prophylaxis: Eliquis   Objective: Vitals:   08/22/23 2017 08/23/23 0541 08/23/23 0803 08/23/23 1148  BP: 115/60 137/72 118/66 131/69  Pulse: 76 67 66 73  Resp: 16  17  16  Temp: 97.6 F (36.4 C) 98 F (36.7 C) 98.3 F (36.8 C) 97.8 F (36.6 C)  TempSrc: Oral Oral Oral Oral   SpO2: 98% 98% 99% 99%  Weight:      Height:        Intake/Output Summary (Last 24 hours) at 08/23/2023 1645 Last data filed at 08/23/2023 1600 Gross per 24 hour  Intake 1563.26 ml  Output 1150 ml  Net 413.26 ml   Filed Weights   08/21/23 1024 08/21/23 1831  Weight: 99.8 kg 97 kg    Exam: General: NAD, noted aphasia Cardiovascular: S1, S2 present Respiratory: CTAB Abdomen: Soft, nontender, nondistended, bowel sounds present Musculoskeletal: No bilateral pedal edema noted Skin: Normal Psychiatry: Normal mood  Neurology: 3/5 strength in right upper and lower extremity, 5/5 in left upper and lower extremity, sensation intact.    Data Reviewed: CBC: Recent Labs  Lab 08/20/23 2255 08/21/23 1736 08/23/23 0515  WBC 6.9 10.0 5.8  NEUTROABS 5.7  --  4.7  HGB 13.5 13.0 11.9*  HCT 42.6 39.9 37.0*  MCV 85.7 83.6 84.7  PLT 225 198 184   Basic Metabolic Panel: Recent Labs  Lab 08/20/23 2255 08/21/23 1736 08/22/23 0648 08/23/23 0515  NA 143 142 146* 141  K 4.4 3.9 4.1 3.8  CL 103 108 109 106  CO2 24 23 25 26   GLUCOSE 136* 121* 111* 151*  BUN 18 18 20 22   CREATININE 1.16 0.97 1.08 0.93  CALCIUM 9.4 9.0 9.2 8.5*  MG  --  2.2  --   --    GFR: Estimated Creatinine Clearance: 94.1 mL/min (by C-G formula based on SCr of 0.93 mg/dL). Liver Function Tests: Recent Labs  Lab 08/20/23 2255  AST 28  ALT 26  ALKPHOS 68  BILITOT 0.6  PROT 7.6  ALBUMIN 4.0   No results for input(s): "LIPASE", "AMYLASE" in the last 168 hours. No results for input(s): "AMMONIA" in the last 168 hours. Coagulation Profile: Recent Labs  Lab 08/20/23 2255  INR 1.0   Cardiac Enzymes: No results for input(s): "CKTOTAL", "CKMB", "CKMBINDEX", "TROPONINI" in the last 168 hours. BNP (last 3 results) No results for input(s): "PROBNP" in the last 8760 hours. HbA1C: No results for input(s): "HGBA1C" in the last 72 hours. CBG: Recent Labs  Lab 08/20/23 2253  GLUCAP 143*   Lipid  Profile: No results for input(s): "CHOL", "HDL", "LDLCALC", "TRIG", "CHOLHDL", "LDLDIRECT" in the last 72 hours. Thyroid Function Tests: Recent Labs    08/22/23 0648  TSH 2.888   Anemia Panel: No results for input(s): "VITAMINB12", "FOLATE", "FERRITIN", "TIBC", "IRON", "RETICCTPCT" in the last 72 hours. Urine analysis:    Component Value Date/Time   COLORURINE YELLOW (A) 11/21/2020 1150   APPEARANCEUR HAZY (A) 11/21/2020 1150   LABSPEC 1.014 11/21/2020 1150   PHURINE 5.0 11/21/2020 1150   GLUCOSEU NEGATIVE 11/21/2020 1150   HGBUR NEGATIVE 11/21/2020 1150   BILIRUBINUR NEGATIVE 11/21/2020 1150   KETONESUR NEGATIVE 11/21/2020 1150   PROTEINUR NEGATIVE 11/21/2020 1150   UROBILINOGEN 0.2 08/02/2009 1129   NITRITE NEGATIVE 11/21/2020 1150   LEUKOCYTESUR NEGATIVE 11/21/2020 1150   Sepsis Labs: @LABRCNTIP (procalcitonin:4,lacticidven:4)  )No results found for this or any previous visit (from the past 240 hours).    Studies: No results found.  Scheduled Meds:  aspirin EC  81 mg Oral Daily   Chlorhexidine Gluconate Cloth  6 each Topical Daily   clopidogrel  75 mg Oral Daily   dexamethasone (DECADRON) injection  4 mg Intravenous Q12H  enoxaparin (LOVENOX) injection  40 mg Subcutaneous Q24H   levETIRAcetam  1,000 mg Oral BID   levothyroxine  125 mcg Oral QAC breakfast   rosuvastatin  40 mg Oral Daily   sodium chloride flush  10-40 mL Intracatheter Q12H    Continuous Infusions:     LOS: 2 days     Briant Cedar, MD [facility]  If 7PM-7AM, please contact night-coverage [email or web address] 08/23/2023, 4:45 PM

## 2023-08-24 ENCOUNTER — Other Ambulatory Visit: Payer: Self-pay | Admitting: Oncology

## 2023-08-24 ENCOUNTER — Telehealth: Payer: Self-pay | Admitting: Oncology

## 2023-08-24 DIAGNOSIS — C3490 Malignant neoplasm of unspecified part of unspecified bronchus or lung: Secondary | ICD-10-CM | POA: Diagnosis not present

## 2023-08-24 DIAGNOSIS — C7931 Secondary malignant neoplasm of brain: Secondary | ICD-10-CM | POA: Diagnosis not present

## 2023-08-24 DIAGNOSIS — R569 Unspecified convulsions: Secondary | ICD-10-CM | POA: Diagnosis not present

## 2023-08-24 MED ORDER — DEXAMETHASONE 4 MG PO TABS
4.0000 mg | ORAL_TABLET | Freq: Every day | ORAL | Status: DC
  Administered 2023-08-25 – 2023-08-26 (×2): 4 mg via ORAL
  Filled 2023-08-24 (×2): qty 1

## 2023-08-24 NOTE — TOC CM/SW Note (Signed)
Transition of Care [facility]) - Inpatient Brief Assessment   Patient Details  Name: Joshua [name] MRN: [id number] Date of Birth: 1955-09-13  [facility] of [facility]) CM/SW Contact:    Kermit Balo, RN Phone Number: 08/24/2023, 2:06 PM   Clinical Narrative:  CIR starting insurance for rehab admission. TOC following.  Transition of Care Asessment: Insurance and Status: Insurance coverage has been reviewed Patient has primary care physician: Yes Home environment has been reviewed: home alone   Prior/Current Home Services: No current home services Social Drivers of Health Review: SDOH reviewed no interventions necessary Readmission risk has been reviewed: Yes Transition of care needs: transition of care needs identified, TOC will continue to follow

## 2023-08-24 NOTE — Care Management Important Message (Signed)
Important Message  Patient Details  Name: Joshua [name] MRN: [id number] Date of Birth: 1956/03/26   Important Message Given:  Yes - Medicare IM     Dorena Bodo 08/24/2023, 2:50 PM

## 2023-08-24 NOTE — Plan of Care (Signed)
  Problem: Education: Goal: Knowledge of General Education information will improve Description: Including pain rating scale, medication(s)/side effects and non-pharmacologic comfort measures Outcome: Progressing   Problem: Health Behavior/Discharge Planning: Goal: Ability to manage health-related needs will improve Outcome: Progressing   Problem: Clinical Measurements: Goal: Ability to maintain clinical measurements within normal limits will improve Outcome: Progressing Goal: Will remain free from infection Outcome: Progressing Goal: Diagnostic test results will improve Outcome: Progressing Goal: Respiratory complications will improve Outcome: Progressing Goal: Cardiovascular complication will be avoided Outcome: Progressing   Problem: Activity: Goal: Risk for activity intolerance will decrease Outcome: Progressing   Problem: Nutrition: Goal: Adequate nutrition will be maintained Outcome: Progressing   Problem: Coping: Goal: Level of anxiety will decrease Outcome: Progressing   Problem: Elimination: Goal: Will not experience complications related to bowel motility Outcome: Progressing Goal: Will not experience complications related to urinary retention Outcome: Progressing   Problem: Pain Managment: Goal: General experience of comfort will improve and/or be controlled Outcome: Progressing   Problem: Safety: Goal: Ability to remain free from injury will improve Outcome: Progressing   Problem: Skin Integrity: Goal: Risk for impaired skin integrity will decrease Outcome: Progressing   Problem: Education: Goal: Expressions of having a comfortable level of knowledge regarding the disease process will increase Outcome: Progressing   Problem: Coping: Goal: Ability to adjust to condition or change in health will improve Outcome: Progressing Goal: Ability to identify appropriate support needs will improve Outcome: Progressing   Problem: Health Behavior/Discharge  Planning: Goal: Compliance with prescribed medication regimen will improve Outcome: Progressing   Problem: Medication: Goal: Risk for medication side effects will decrease Outcome: Progressing   Problem: Clinical Measurements: Goal: Complications related to the disease process, condition or treatment will be avoided or minimized Outcome: Progressing Goal: Diagnostic test results will improve Outcome: Progressing   Problem: Safety: Goal: Verbalization of understanding the information provided will improve Outcome: Progressing   Problem: Self-Concept: Goal: Level of anxiety will decrease Outcome: Progressing Goal: Ability to verbalize feelings about condition will improve Outcome: Progressing

## 2023-08-24 NOTE — Progress Notes (Signed)
Physical Therapy Treatment Patient Details Name: Joshua [name] MRN: [id number] DOB: 1956/05/26 Today's Date: 08/24/2023   History of Present Illness 68 yo male presents to [facility] ED 1/31 after a fall and seizure like activity. Transferred to [facility] for stroke work up. 1/28 pt reported increased R-sided weakness and slurred speech.MRI -  minimally increased size of 2 left cerebral metastases since 08/05/2023. Worsening, extensive vasogenic edema with 7 mm of rightward midline shift.  EEG suggestive of cortical dysfunction arising from left temporal region likely secondary to structural abnormality/ tumor. 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    PT Comments  Pt seen for PT tx with pt agreeable, eager to participate. Pt reports prior to admission he usually ambulated without AD but would use SPC outside of the house, RW only to transport food in the house since it has a tray on it but upon further discussion, pt reports he furniture walks in the home. On this date, pt is able to transfer STS with min assist with extra time to power up to standing, cuing re: hand placement. Pt ambulates with RW & min assist with impaired gait pattern noted below but increased gait distances. Pt attempts gait in room without AD with min assist but demonstrates more R knee A/P instability. Pt would benefit from ongoing PT services to address strengthening, balance, endurance, to reduce fall risk with mobility.    If plan is discharge home, recommend the following: Assistance with cooking/housework;Assist for transportation;Help with stairs or ramp for entrance;A little help with walking and/or transfers;A little help with bathing/dressing/bathroom   Can travel by private vehicle        Equipment Recommendations  Other (comment) (defer to next venue)    Recommendations for Other  Services       Precautions / Restrictions Precautions Precautions: Fall Restrictions Weight Bearing Restrictions Per Provider Order: No     Mobility  Bed Mobility               General bed mobility comments: not tested, pt received & left sitting in recliner    Transfers Overall transfer level: Needs assistance Equipment used: None, Rolling walker (2 wheels) Transfers: Sit to/from Stand Sit to Stand: Min assist (extra time to power up to standing, cuing re: hand placement when transferring sit>stand with RW)                Ambulation/Gait Ambulation/Gait assistance: Min assist Gait Distance (Feet): 150 Feet (+ 20 ft) Assistive device: Rolling walker (2 wheels), None Gait Pattern/deviations: Decreased step length - right, Decreased stride length, Decreased dorsiflexion - right, Decreased step length - left Gait velocity: decreased     General Gait Details: Pt ambulates in room & hallway with RW & min assist with absent R dorsiflexion & heel strike with PT encouraging pt to attempt; pt reports this is baseline 2/2 hx of PAD. Pt with slight trunk flexion & difficulty uprighting posture with pt reporting this is baseline. Pt then ambulates in room without AD with min assist with pt frequently reaching for objects for support despite encouragement to attempt not to. Pt with decreased weight shifting to RLE when ambulating without AD, decreased RLE quad strength & RLE knee A/P instability when ambulating without AD but no overt buckling nor LOB.   Stairs             Psychologist, prison and probation services  Tilt Bed    Modified Rankin (Stroke Patients Only)       Balance Overall balance assessment: History of Falls, Needs assistance Sitting-balance support: Feet supported Sitting balance-Leahy Scale: Fair     Standing balance support: During functional activity, No upper extremity supported Standing balance-Leahy Scale: Poor                               Cognition Arousal: Alert   Overall Cognitive Status: Impaired/Different from baseline Area of Impairment: Attention, Following commands, Awareness, Safety/judgement                       Following Commands: Follows one step commands consistently Safety/Judgement: Decreased awareness of safety, Decreased awareness of deficits Awareness: Emergent            Exercises      General Comments        Pertinent Vitals/Pain Pain Assessment Pain Assessment: No/denies pain    Home Living                          Prior Function            PT Goals (current goals can now be found in the care plan section) Acute Rehab PT Goals PT Goal Formulation: With patient Time For Goal Achievement: 09/05/23 Potential to Achieve Goals: Good Progress towards PT goals: Progressing toward goals    Frequency    Min 1X/week      PT Plan      Co-evaluation              AM-PAC PT "6 Clicks" Mobility   Outcome Measure  Help needed turning from your back to your side while in a flat bed without using bedrails?: None Help needed moving from lying on your back to sitting on the side of a flat bed without using bedrails?: A Little Help needed moving to and from a bed to a chair (including a wheelchair)?: A Little Help needed standing up from a chair using your arms (e.g., wheelchair or bedside chair)?: A Little Help needed to walk in hospital room?: A Little Help needed climbing 3-5 steps with a railing? : A Lot 6 Click Score: 18    End of Session Equipment Utilized During Treatment: Gait belt Activity Tolerance: Patient tolerated treatment well Patient left: in chair;with chair alarm set;with call bell/phone within reach;with nursing/sitter in room Nurse Communication: Mobility status PT Visit Diagnosis: Unsteadiness on feet (R26.81);Repeated falls (R29.6);Muscle weakness (generalized) (M62.81);Difficulty in walking, not elsewhere classified (R26.2);Other  symptoms and signs involving the nervous system (R29.898)     Time: 2956-2130 PT Time Calculation (min) (ACUTE ONLY): 24 min  Charges:    $Therapeutic Activity: 8-22 mins $Neuromuscular Re-education: 8-22 mins PT General Charges $$ ACUTE PT VISIT: 1 Visit                     Aleda Grana, PT, DPT 08/24/23, 1:49 PM   Sandi Mariscal 08/24/2023, 1:48 PM

## 2023-08-24 NOTE — PMR Pre-admission (Shared)
PMR Admission Coordinator Pre-Admission Assessment  Patient: Joshua [name] is an 68 y.o., male MRN: [id number] DOB: [birth year] Height: 5\' 11"  (180.3 cm) Weight: 97 kg              Insurance Information HMO: yes    PPO:      PCP:      IPA:      80/20:      OTHER:   PRIMARY: [employer]     Policy#: [id number]  ; Medicare # [id number]    Subscriber: patient CM Name: Junious Dresser      Phone#: [phone]     Fax#: [phone] and EPIC access Pre-Cert#: [id number]      Employer:  Benefits:  Phone #: [phone]     Name: 2/3 Eff. Date: 07/22/23     Deduct: none      Out of Pocket Max: $3400      Life Max: none  CIR: $195 Co pay per day days 1 until 6      SNF: no co pay per day days 1 until 20; $214 co pay per day days 21 until 100 Outpatient: $ 5 per visit     Co-Pay: visits per medical neccesity Home Health: 100%      Co-Pay: visits per medical neccesity DME: 80%     Co-Pay: 20% Providers: in network  SECONDARY: none  Financial Counselor:       Phone#:   The Data processing manager" for patients in Inpatient Rehabilitation Facilities with attached "Privacy Act Statement-Health Care Records" was provided and verbally reviewed with: Patient  Emergency Contact Information Contact Information     Name Relation Home Work Mobile   [place]   [phone]   Dione Plover [phone]  [phone]   Glory Buff Daughter   [phone]      Other Contacts   None on File    Current Medical History  Patient Admitting Diagnosis: Seizure, Brain mets  History of Present Illness: Joshua [name] is a 68 y.o. 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 Initially presented to [facility] and transferred to [facility] on 08/21/23. Apparently he had been experiencing increased right-sided weakness over the course of  the week.    He was started on p.o. Decadron prior to presentation but this did not improve his symptoms.  He was noticed to have jerking movements on the right side concerning for a seizure.  In the emergency department initially a code stroke was called and CT scan of the head revealed increased left frontal parietal edema with mass effect and 9 mm right-to-left midline shift concerning for advancing metastatic disease.  Patient was given Keppra Ativan and Vimpat and IV Decadron which improved twitching movements of his right side.  EEG suggested cortical dysfunction arising from the left temporal region but no seizure or epileptiform discharges.  Dr. Barbaraann Cao recommended 4 mg of Decadron daily at discharge.  He was given IV fluid for his hypernatremia.  He continues on aspirin and Plavix and statin for his peripheral artery disease.    Complete NIHSS TOTAL: 9 Glasgow Coma Scale Score: 15  Patient's medical record from [facility] and [facility] has been reviewed by the rehabilitation admission coordinator and physician.  Past Medical History  Past Medical History:  Diagnosis Date   Allergy    Cancer (HCC)    lung  COPD (chronic obstructive pulmonary disease) (HCC)    Dyspnea    former smoker. 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 No  Family History  family history includes Diabetes in his mother; Heart murmur in his mother; Hypertension in his mother; Leukemia in his mother; Throat cancer in his maternal grandmother.  Current Medications   Current Facility-Administered Medications:    acetaminophen (TYLENOL) tablet 650 mg, 650 mg, Oral, Q4H PRN, 650 mg at 08/23/23 1259 **OR** acetaminophen (TYLENOL) suppository 650 mg, 650 mg, Rectal,  Q4H PRN, Katrinka Blazing, Rondell A, MD   aspirin EC tablet 81 mg, 81 mg, Oral, Daily, Katrinka Blazing, Rondell A, MD, 81 mg at 08/25/23 0800   Chlorhexidine Gluconate Cloth 2 % PADS 6 each, 6 each, Topical, Daily, Briant Cedar, MD, 6 each at 08/25/23 0801   clopidogrel (PLAVIX) tablet 75 mg, 75 mg, Oral, Daily, Katrinka Blazing, Rondell A, MD, 75 mg at 08/25/23 0800   dexamethasone (DECADRON) tablet 4 mg, 4 mg, Oral, Daily, Briant Cedar, MD, 4 mg at 08/25/23 0800   enoxaparin (LOVENOX) injection 40 mg, 40 mg, Subcutaneous, Q24H, Smith, Rondell A, MD, 40 mg at 08/24/23 2116   levETIRAcetam (KEPPRA) tablet 1,000 mg, 1,000 mg, Oral, BID, 1,000 mg at 08/25/23 0800 **OR** [DISCONTINUED] levETIRAcetam (KEPPRA) IVPB 1000 mg/100 mL premix, 1,000 mg, Intravenous, BID, Erick Blinks, MD, Stopping previously hung infusion at 08/24/23 1916   levothyroxine (SYNTHROID) tablet 125 mcg, 125 mcg, Oral, QAC breakfast, Madelyn Flavors A, MD, 125 mcg at 08/25/23 0546   LORazepam (ATIVAN) injection 1-2 mg, 1-2 mg, Intravenous, Q2H PRN, Smith, Rondell A, MD   ondansetron (ZOFRAN) tablet 4 mg, 4 mg, Oral, Q6H PRN **OR** ondansetron (ZOFRAN) injection 4 mg, 4 mg, Intravenous, Q6H PRN, Katrinka Blazing, Rondell A, MD   Oral care mouth rinse, 15 mL, Mouth Rinse, PRN, Briant Cedar, MD   rosuvastatin (CRESTOR) tablet 40 mg, 40 mg, Oral, Daily, Smith, Rondell A, MD, 40 mg at 08/25/23 0800   sodium chloride flush (NS) 0.9 % injection 10-40 mL, 10-40 mL, Intracatheter, Q12H, Briant Cedar, MD, 10 mL at 08/25/23 0802   sodium chloride flush (NS) 0.9 % injection 10-40 mL, 10-40 mL, Intracatheter, PRN, Briant Cedar, MD  Facility-Administered Medications Ordered in Other Encounters:    albuterol (PROVENTIL) (2.5 MG/3ML) 0.083% nebulizer solution 2.5 mg, 2.5 mg, Nebulization, Once, Salena Saner, MD   heparin lock flush 100 unit/mL, 500 Units, Intracatheter, Once PRN, [name], MD  Patients Current Diet:  Diet Order              Diet regular Room service appropriate? Yes; Fluid consistency: Thin  Diet effective now                  Precautions / Restrictions Precautions Precautions: Fall Precaution Comments: R inattention Restrictions Weight Bearing Restrictions Per Provider Order: No   Has the patient had 2 or more falls or a fall with injury in the past year?No  Prior Activity Level Community (5-7x/wk): Mod I with RW in home and Mod I with cane in the community  Prior Functional Level Prior Function Prior Level of Function : Independent/Modified Independent, Driving Mobility Comments: Uses RW in th  ehouse and cane outside  Self Care: Did the patient need help bathing, dressing, using the toilet or eating?  Independent  Indoor Mobility: Did the patient need assistance with walking from room to room (with or without device)? Independent  Stairs: Did the patient need assistance with internal or external stairs (with or without device)? Independent  Functional Cognition: Did the patient need help planning regular tasks such as shopping or remembering to take medications? Independent  Patient Information Are you of Hispanic, Latino/a,or Spanish origin?: A. No, not of Hispanic, Latino/a, or Spanish origin What is your race?: A. White Do you need or want an interpreter to communicate with a doctor or health care staff?: 0. No  Patient's Response To:  Health Literacy and Transportation Is the patient able to respond to health literacy and transportation needs?: Yes Health Literacy - How often do you need to have someone help you when you read instructions, pamphlets, or other written material from your doctor or pharmacy?: Never In the past 12 months, has lack of transportation kept you from medical appointments or from getting medications?: No In the past 12 months, has lack of transportation kept you from meetings, work, or from getting things needed for daily living?: No  Home Assistive  Devices / Equipment Home Equipment: Agricultural consultant (2 wheels), Wheelchair - manual, Information systems manager, Grab bars - tub/shower, Hand held shower head, Cane - single point (walker tray)  Prior Device Use: Indicate devices/aids used by the patient prior to current illness, exacerbation or injury?  cane  and RW  Current Functional Level Cognition  Arousal/Alertness: Awake/alert Overall Cognitive Status: Impaired/Different from baseline Current Attention Level: Selective Orientation Level: Oriented X4 Following Commands: Follows one step commands consistently Safety/Judgement: Decreased awareness of safety, Decreased awareness of deficits General Comments: unsure of baseline; inattention noted with with some increased distractibility, easily redirectable Attention: Sustained Sustained Attention: Appears intact Memory: Impaired Memory Impairment: Retrieval deficit, Decreased recall of new information, Decreased short term memory Decreased Short Term Memory: Verbal basic Awareness: Appears intact Problem Solving: Impaired Problem Solving Impairment: Verbal complex Executive Function: Self Monitoring, Self Correcting Self Monitoring: Impaired Self Monitoring Impairment: Verbal complex Self Correcting: Impaired Self Correcting Impairment: Verbal complex    Extremity Assessment (includes Sensation/Coordination)  Upper Extremity Assessment: Defer to OT evaluation RUE Deficits / Details: stronger proximally; isolated joint movement; gross grasp/release; no in-hand manipulation skiolls; able to grossly oppose thumb to index finger; difficulty using as a functional assist RUE Sensation: decreased proprioception RUE Coordination: decreased fine motor, decreased gross motor  Lower Extremity Assessment: RLE deficits/detail RLE Deficits / Details: stronger proximally, ankle eversion with lifting LE RLE Sensation: decreased proprioception RLE Coordination: decreased fine motor, decreased gross motor     ADLs  Overall ADL's : Needs assistance/impaired Eating/Feeding: Set up, Sitting Grooming: Set up, Sitting Upper Body Bathing: Minimal assistance, Sitting Lower Body Bathing: Moderate assistance, Sit to/from stand Upper Body Dressing : Moderate assistance Lower Body Dressing: Maximal assistance, Sit to/from stand Toilet Transfer: Moderate assistance, Rolling walker (2 wheels) (simulated) Toileting- Clothing Manipulation and Hygiene: Moderate assistance Functional mobility during ADLs: Moderate assistance, Rolling walker (2 wheels), Cueing for safety General ADL Comments: began educating on compensatory 1 handed technique for donning sock in figure four position    Mobility  Overal bed mobility: Needs Assistance Bed Mobility: Supine to Sit Supine to sit: Supervision, HOB elevated, Used rails Sit to supine: Supervision, Used rails General bed mobility comments: supervision for safety, increased time and effortful    Transfers  Overall transfer level: Needs assistance Equipment used: Rolling walker (2 wheels) Transfers: Sit to/from Stand Sit to Stand: Contact guard assist Bed to/from chair/wheelchair/BSC transfer type:: Step pivot, Lateral/scoot transfer Step pivot transfers: Min assist  Lateral/Scoot Transfers: Min assist (for lateral stepping to L) General transfer comment: CGA for safety, increased time to recall hand safe placement but able without cues, good eccentric control to lower to stting    Ambulation / Gait / Stairs / Wheelchair Mobility  Ambulation/Gait Ambulation/Gait assistance: Contact guard assist Gait Distance (Feet): 250 Feet Assistive device: Rolling walker (2 wheels), None Gait Pattern/deviations: Decreased step length - right, Decreased stride length, Decreased dorsiflexion - right, Decreased step length - left General Gait Details: some improved RLE clearance, continued poor active DF with absent heel strike, cues for wider BOS as pt adducting RLE on swing  and close to scissoring feet. able to correct but unable to maintain, pt with improved posture with repeated cues to step more inside RW frame, pt reporting at baseline his tray blocks ability to be inside RW however tray is removable Gait velocity: decreased    Posture / Balance Balance Overall balance assessment: History of Falls, Needs assistance Sitting-balance support: Feet supported Sitting balance-Leahy Scale: Fair Standing balance support: During functional activity, No upper extremity supported Standing balance-Leahy Scale: Poor Standing balance comment: reliant on at least single UE support standing at sink for ADLS    Special needs/care consideration      Previous Home Environment  Living Arrangements: Alone Available Help at Discharge: Family, Friend(s), Available PRN/intermittently Type of Home: House Home Layout: One level Home Access: Ramped entrance Bathroom Shower/Tub: Tub/shower unit, Health visitor: Handicapped height Bathroom Accessibility: Yes How Accessible: Accessible via walker Home Care Services: No  Discharge Living Setting Plans for Discharge Living Setting: Patient's home Type of Home at Discharge: House Discharge Home Layout: One level Discharge Home Access: Ramped entrance Discharge Bathroom Shower/Tub: Tub/shower unit, Walk-in shower Discharge Bathroom Toilet: Handicapped height Discharge Bathroom Accessibility: Yes How Accessible: Accessible via walker Does the patient have any problems obtaining your medications?: No  Social/Family/Support Systems Anticipated Caregiver: Lourena Simmonds Anticipated Caregiver's Contact Information: [phone] Caregiver Availability: Intermittent Discharge Plan Discussed with Primary Caregiver: Yes Is Caregiver In Agreement with Plan?: Yes Does Caregiver/Family have Issues with Lodging/Transportation while Pt is in Rehab?: No  Goals Patient/Family Goal for Rehab: Mod I PT/OT, I SLP Expected  length of stay: 7-10 days Pt/Family Agrees to Admission and willing to participate: Yes Program Orientation Provided & Reviewed with Pt/Caregiver Including Roles  & Responsibilities: Yes  Barriers to Discharge: Insurance for SNF coverage  Decrease burden of Care through IP rehab admission: Othern/a  Possible need for SNF placement upon discharge:not anticipated  Patient Condition: This patient's medical and functional status has changed since the consult dated 08/24/23 in which the Rehabilitation Physician determined and documented that the patient was potentially appropriate for intensive rehabilitative care in an inpatient rehabilitation facility. Issues have been addressed and update has been discussed with Dr. Riley Kill and patient now appropriate for inpatient rehabilitation. Will admit to inpatient rehab today.   Preadmission Screen Completed By:  Clois Dupes, RN MSN, 08/25/2023 2:57 PM ______________________________________________________________________   Discussed status with Dr. Marland Kitchenon***at *** and received approval for admission today.  Admission Coordinator:  Clois Dupes, RN MSN time***/Date***

## 2023-08-24 NOTE — Consult Note (Signed)
Physical Medicine and Rehabilitation Consult Reason for Consult:impaired functional mobility Referring Physician: Sharolyn Douglas   HPI: Joshua [name] is a 68 y.o. 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  Apparently he had been experiencing increased right-sided weakness over the course of the week.  He was started on p.o. Decadron prior to presentation but this did not improve his symptoms.  He was noticed to have jerking movements on the right side concerning for a seizure.  In the emergency department initially a code stroke was called and CT scan of the head revealed increased left frontal parietal edema with mass effect and 9 mm right-to-left midline shift concerning for advancing metastatic disease.  Patient was given Keppra Ativan and Vimpat and IV Decadron which improved twitching movements of his right side.  EEG suggested cortical dysfunction arising from the left temporal region but no seizure or epileptiform discharges.  Dr. Barbaraann Cao recommended 4 mg of Decadron daily at discharge.  He was given IV fluid for his hypernatremia.  He continues on aspirin and Plavix and statin for his peripheral artery disease.  Patient worked with therapy over the weekend and was min assist for transfers but ambulation was not tested. Patient was min to max assist with ADLs.  Therapy is pending today.  Patient is experiencing improvement in the control of his right side.  Patient lives alone and was independent and driving prior to this admission.  He states that he has 2 aunts who can provide some assistance but aside from that social supports appear sparse.        Review of Systems  Constitutional:  Negative for fever.  HENT: Negative.    Eyes:  Positive for blurred vision.  Respiratory:  Positive for cough.   Cardiovascular: Negative.   Gastrointestinal:   Negative for nausea and vomiting.  Genitourinary: Negative.   Musculoskeletal: Negative.   Skin:  Negative for rash.  Neurological:  Positive for sensory change, weakness and headaches.  Psychiatric/Behavioral:  Negative for suicidal ideas.    Past Medical History:  Diagnosis Date   Allergy    Cancer (HCC)    lung   COPD (chronic obstructive pulmonary disease) (HCC)    Dyspnea    former smoker. 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   Malignant neoplasm metastatic to brain [facility])    Peripheral vascular disease (HCC)    Pre-diabetes    Sleep apnea    Past Surgical History:  Procedure Laterality Date   ANTERIOR CERVICAL DECOMP/DISCECTOMY FUSION N/A 03/14/2020   Procedure: ANTERIOR CERVICAL DECOMPRESSION/DISCECTOMY FUSION 3 LEVELS C4-7;  Surgeon: Venetia Night, MD;  Location: [facility] ORS;  Service: Neurosurgery;  Laterality: N/A;   BACK SURGERY  2011   CENTRAL LINE INSERTION Right 09/10/2016   Procedure: CENTRAL LINE INSERTION;  Surgeon: Renford Dills, MD;  Location: [facility] ORS;  Service: Vascular;  Laterality: Right;   CERVICAL FUSION     C4-c7 on Mar 14, 2020   COLONOSCOPY  2013 ?   COLONOSCOPY N/A 09/09/2021   Procedure: COLONOSCOPY;  Surgeon: Jaynie Collins, DO;  Location: [facility] ENDOSCOPY;  Service: Gastroenterology;  Laterality: N/A;   CORONARY ANGIOPLASTY     ESOPHAGOGASTRODUODENOSCOPY N/A 09/09/2021   Procedure: ESOPHAGOGASTRODUODENOSCOPY (EGD);  Surgeon: Jaynie Collins, DO;  Location: [facility] ENDOSCOPY;  Service: Gastroenterology;  Laterality: N/A;   FEMORAL-POPLITEAL BYPASS GRAFT Right 09/10/2016   Procedure: BYPASS GRAFT FEMORAL-POPLITEAL ARTERY ( BELOW KNEE );  Surgeon: Renford Dills, MD;  Location: [facility] ORS;  Service: Vascular;  Laterality: Right;   FEMORAL-TIBIAL BYPASS GRAFT Right 03/23/2020   Procedure: BYPASS GRAFT RIGHT FEMORAL- DISTAL TIBIAL ARTERY WITH DISTAL FLOW GRAFT;  Surgeon: Renford Dills, MD;  Location: [facility] ORS;  Service: Vascular;  Laterality: Right;   LEFT HEART CATH AND CORONARY ANGIOGRAPHY Left 05/16/2021   Procedure: LEFT HEART CATH AND CORONARY ANGIOGRAPHY;  Surgeon: Armando Reichert, MD;  Location: [facility] INVASIVE CV LAB;  Service: Cardiovascular;  Laterality: Left;   LOWER EXTREMITY ANGIOGRAPHY Right 11/18/2016   Procedure: Lower Extremity Angiography;  Surgeon: Renford Dills, MD;  Location: [facility] INVASIVE CV LAB;  Service: Cardiovascular;  Laterality: Right;   LOWER EXTREMITY ANGIOGRAPHY Right 12/09/2016   Procedure: Lower Extremity Angiography;  Surgeon: Renford Dills, MD;  Location: [facility] INVASIVE CV LAB;  Service: Cardiovascular;  Laterality: Right;   LOWER EXTREMITY ANGIOGRAPHY Right 11/15/2019   Procedure: LOWER EXTREMITY ANGIOGRAPHY;  Surgeon: Renford Dills, MD;  Location: [facility] INVASIVE CV LAB;  Service: Cardiovascular;  Laterality: Right;   LOWER EXTREMITY ANGIOGRAPHY Right 12/01/2019   Procedure: Lower Extremity Angiography;  Surgeon: Annice Needy, MD;  Location: [facility] INVASIVE CV LAB;  Service: Cardiovascular;  Laterality: Right;   LOWER EXTREMITY ANGIOGRAPHY Right 12/02/2019   Procedure: LOWER EXTREMITY ANGIOGRAPHY;  Surgeon: Renford Dills, MD;  Location: [facility] INVASIVE CV LAB;  Service: Cardiovascular;  Laterality: Right;   LOWER EXTREMITY ANGIOGRAPHY Right 03/23/2020   Procedure: Lower Extremity Angiography;  Surgeon: Renford Dills, MD;  Location: [facility] INVASIVE CV LAB;  Service: Cardiovascular;  Laterality: Right;   LOWER EXTREMITY ANGIOGRAPHY Right 09/25/2021   Procedure: Lower Extremity Angiography;  Surgeon: Renford Dills, MD;  Location: [facility] INVASIVE CV LAB;  Service: Cardiovascular;  Laterality: Right;   LOWER EXTREMITY ANGIOGRAPHY Right 09/26/2021   Procedure: Lower Extremity Angiography;  Surgeon: Annice Needy, MD;  Location: [facility] INVASIVE CV LAB;  Service: Cardiovascular;  Laterality: Right;   LOWER EXTREMITY ANGIOGRAPHY Right  04/08/2022   Procedure: Lower Extremity Angiography;  Surgeon: Renford Dills, MD;  Location: [facility] INVASIVE CV LAB;  Service: Cardiovascular;  Laterality: Right;   LOWER EXTREMITY INTERVENTION  11/18/2016   Procedure: Lower Extremity Intervention;  Surgeon: Renford Dills, MD;  Location: [facility] INVASIVE CV LAB;  Service: Cardiovascular;;   PERIPHERAL VASCULAR CATHETERIZATION Right 01/02/2015   Procedure: Lower Extremity Angiography;  Surgeon: Renford Dills, MD;  Location: [facility] INVASIVE CV LAB;  Service: Cardiovascular;  Laterality: Right;   PERIPHERAL VASCULAR CATHETERIZATION Right 06/18/2015   Procedure: Lower Extremity Angiography;  Surgeon: Annice Needy, MD;  Location: [facility] INVASIVE CV LAB;  Service: Cardiovascular;  Laterality: Right;   PERIPHERAL VASCULAR CATHETERIZATION  06/18/2015   Procedure: Lower Extremity Intervention;  Surgeon: Annice Needy, MD;  Location: [facility] INVASIVE CV LAB;  Service: Cardiovascular;;   PERIPHERAL VASCULAR CATHETERIZATION N/A 10/09/2015   Procedure: Abdominal Aortogram w/Lower Extremity;  Surgeon: Renford Dills, MD;  Location: [facility] INVASIVE CV LAB;  Service: Cardiovascular;  Laterality: N/A;   PERIPHERAL VASCULAR CATHETERIZATION  10/09/2015   Procedure: Lower Extremity Intervention;  Surgeon: Renford Dills, MD;  Location: [facility] INVASIVE CV LAB;  Service: Cardiovascular;;   PERIPHERAL VASCULAR CATHETERIZATION Right 10/10/2015   Procedure: Lower Extremity Angiography;  Surgeon: Annice Needy, MD;  Location: [facility] INVASIVE CV LAB;  Service: Cardiovascular;  Laterality: Right;  PERIPHERAL VASCULAR CATHETERIZATION Right 10/10/2015   Procedure: Lower Extremity Intervention;  Surgeon: Annice Needy, MD;  Location: [facility] INVASIVE CV LAB;  Service: Cardiovascular;  Laterality: Right;   PERIPHERAL VASCULAR CATHETERIZATION Right 12/03/2015   Procedure: Lower Extremity Angiography;  Surgeon: Annice Needy, MD;  Location: [facility] INV[facility] LAB;  Service: Cardiovascular;   Laterality: Right;   PERIPHERAL VASCULAR CATHETERIZATION Right 12/04/2015   Procedure: Lower Extremity Angiography;  Surgeon: Annice Needy, MD;  Location: [facility] INVAS[facility] CV LAB;  Service: Cardiovascular;  Laterality: Right;   PERIPHERAL VASCULAR CATHETERIZATION  12/04/2015   Procedure: Lower Extremity Intervention;  Surgeon: Annice Needy, MD;  Location: [facility] INVASIVE CV LAB;  Service: Cardiovascular;;   PERIPHERAL VASCULAR CATHETERIZATION Right 06/30/2016   Procedure: Lower Extremity Angiography;  Surgeon: Annice Needy, MD;  Location: [facility] INVASIVE CV LAB;  Service: Cardiovascular;  Laterality: Right;   PERIPHERAL VASCULAR CATHETERIZATION Right 06/25/2016   Procedure: Lower Extremity Angiography;  Surgeon: Renford Dills, MD;  Location: [facility] INVAS[facility] LAB;  Service: Cardiovascular;  Laterality: Right;   PERIPHERAL VASCULAR CATHETERIZATION Right 07/01/2016   Procedure: Lower Extremity Angiography;  Surgeon: Renford Dills, MD;  Location: [facility] INVAS[facility] LAB;  Service: Cardiovascular;  Laterality: Right;   PERIPHERAL VASCULAR CATHETERIZATION  07/01/2016   Procedure: Lower Extremity Intervention;  Surgeon: Renford Dills, MD;  Location: [facility] INVASIVE CV LAB;  Service: Cardiovascular;;   PERIPHERAL VASCULAR CATHETERIZATION Right 08/01/2016   Procedure: Lower Extremity Angiography;  Surgeon: Renford Dills, MD;  Location: [facility] INVASIVE CV LAB;  Service: Cardiovascular;  Laterality: Right;   PORTA CATH INSERTION N/A 10/05/2020   Procedure: PORTA CATH INSERTION;  Surgeon: Renford Dills, MD;  Location: [facility] INVASIVE CV LAB;  Service: Cardiovascular;  Laterality: N/A;   stent placement in right leg Right    VIDEO BRONCHOSCOPY N/A 09/14/2020   Procedure: VIDEO BRONCHOSCOPY WITH FLUORO;  Surgeon: Salena Saner, MD;  Location: [facility] ORS;  Service: Cardiopulmonary;  Laterality: N/A;   VIDEO BRONCHOSCOPY WITH ENDOBRONCHIAL ULTRASOUND N/A 09/14/2020   Procedure: VIDEO BRONCHOSCOPY WITH  ENDOBRONCHIAL ULTRASOUND;  Surgeon: Salena Saner, MD;  Location: [facility] ORS;  Service: Cardiopulmonary;  Laterality: N/A;   Family History  Problem Relation Age of Onset   Diabetes Mother    Hypertension Mother    Heart murmur Mother    Leukemia Mother    Throat cancer Maternal Grandmother    Colon cancer Neg Hx    Breast cancer Neg Hx    Social History:  reports that he quit smoking about 8 years ago. His smoking use included cigarettes. He started smoking about 46 years ago. He has a 76 pack-year smoking history. He has never used smokeless tobacco. He reports that he does not currently use alcohol. He reports that he does not use drugs. Allergies: No Known Allergies Medications Prior to Admission  Medication Sig Dispense Refill   aspirin EC 81 MG tablet Take 81 mg by mouth daily. Swallow whole.     clopidogrel (PLAVIX) 75 MG tablet TAKE 1 TABLET BY MOUTH DAILY 100 tablet 2   dexamethasone (DECADRON) 4 MG tablet Take 0.5 tablets (2 mg total) by mouth daily. 15 tablet 1   levothyroxine (SYNTHROID) 125 MCG tablet Take 1 tablet (125 mcg total) by mouth daily before breakfast. 90 tablet 0   rosuvastatin (CRESTOR) 40 MG tablet Take 1 tablet (40 mg total) by mouth daily. 90 tablet 3   Vitamin D, Ergocalciferol, (DRISDOL) 1.25 MG (50000 UNIT) CAPS capsule  Take 1 capsule (50,000 Units total) by mouth every 7 (seven) days. (Patient taking differently: Take 50,000 Units by mouth every 7 (seven) days. On Sundays) 13 capsule 3    Home: Home Living Family/patient expects to be discharged to:: Private residence Living Arrangements: Alone Available Help at Discharge: Family, Friend(s), Available PRN/intermittently Type of Home: House Home Access: Ramped entrance Home Layout: One level Bathroom Shower/Tub: Tub/shower unit, Health visitor: Handicapped height Bathroom Accessibility: Yes Home Equipment: Agricultural consultant (2 wheels), Wheelchair - manual, Information systems manager, Grab bars -  tub/shower, Hand held shower head, Cane - single point (walker tray)  Functional History: Prior Function Prior Level of Function : Independent/Modified Independent, Driving Mobility Comments: Uses RW in th ehouse and cane outside Functional Status:  Mobility: Bed Mobility Overal bed mobility: Needs Assistance Bed Mobility: Sit to Supine Supine to sit: Mod assist, HOB elevated, Used rails Sit to supine: Supervision, Used rails General bed mobility comments: increased time, effort and use of momentum to navigate LE back into bed Transfers Overall transfer level: Needs assistance Equipment used: Rolling walker (2 wheels) Transfers: Sit to/from Stand, Bed to chair/wheelchair/BSC Sit to Stand: Min assist Bed to/from chair/wheelchair/BSC transfer type:: Step pivot, Lateral/scoot transfer Step pivot transfers: Min assist  Lateral/Scoot Transfers: Min assist (for lateral stepping to L) General transfer comment: mod A from low recliner and minA from elevated bed surface for bracing knee to keep from buckling while coming to standing, min A and maximal cuing for sequencing to take pivotal steps from recliner to bed and for lateral stepping up the side of the bed Ambulation/Gait General Gait Details: deferred for safety    ADL: ADL Overall ADL's : Needs assistance/impaired Eating/Feeding: Set up, Sitting Grooming: Set up, Sitting Upper Body Bathing: Minimal assistance, Sitting Lower Body Bathing: Moderate assistance, Sit to/from stand Upper Body Dressing : Moderate assistance Lower Body Dressing: Maximal assistance, Sit to/from stand Toilet Transfer: Moderate assistance, Rolling walker (2 wheels) (simulated) Toileting- Clothing Manipulation and Hygiene: Moderate assistance Functional mobility during ADLs: Moderate assistance, Rolling walker (2 wheels), Cueing for safety General ADL Comments: began educating on compensatory 1 handed technique for donning sock in figure four  position  Cognition: Cognition Overall Cognitive Status: Impaired/Different from baseline Arousal/Alertness: Awake/alert Orientation Level: Oriented X4 Year: 2025 Month: February Day of Week: Correct Attention: Sustained Sustained Attention: Appears intact Memory: Impaired Memory Impairment: Retrieval deficit, Decreased recall of new information, Decreased short term memory Decreased Short Term Memory: Verbal basic Awareness: Appears intact Problem Solving: Impaired Problem Solving Impairment: Verbal complex Executive Function: Self Monitoring, Self Correcting Self Monitoring: Impaired Self Monitoring Impairment: Verbal complex Self Correcting: Impaired Self Correcting Impairment: Verbal complex Cognition Arousal: Alert Behavior During Therapy: Impulsive Overall Cognitive Status: Impaired/Different from baseline Area of Impairment: Attention, Memory, Safety/judgement, Awareness, Problem solving Current Attention Level: Selective Memory: Decreased short-term memory Safety/Judgement: Decreased awareness of safety, Decreased awareness of deficits Awareness: Emergent General Comments: unsure of baseline; inattention noted with decreased on-line awareness  Blood pressure 134/83, pulse 63, temperature 97.7 F (36.5 C), temperature source Oral, resp. rate 16, height 5\' 11"  (1.803 m), weight 97 kg, SpO2 100%. Physical Exam Constitutional:      General: He is not in acute distress. HENT:     Head: Normocephalic.     Nose: Nose normal.     Mouth/Throat:     Mouth: Mucous membranes are moist.  Eyes:     Extraocular Movements: Extraocular movements intact.     Pupils: Pupils are equal, round, and  reactive to light.  Cardiovascular:     Rate and Rhythm: Normal rate.  Pulmonary:     Effort: Pulmonary effort is normal.  Abdominal:     Palpations: Abdomen is soft.  Musculoskeletal:        General: No swelling or tenderness.     Cervical back: Normal range of motion.  Skin:     General: Skin is warm.  Neurological:     Mental Status: He is alert.     Comments: Alert and oriented x 3. Normal insight and awareness. Intact Memory. Normal language and speech. Cranial nerve exam unremarkable. Visual fields intact. MMT: RUE 4/5. LUE 4-/5. RLE 4/5, LLE 4+/5. Senses pain and light touch in all 4 limbs. +PD RUE, decreased FTN and impaired [facility] RUE. DTR's 3+ on all 4.    Psychiatric:        Mood and Affect: Mood normal.        Behavior: Behavior normal.     No results found. However, due to the size of the patient record, not all encounters were searched. Please check Results Review for a complete set of results. No results found.  Assessment/Plan: Diagnosis: 69 year old male with metastatic lung cancer to the brain with associated cerebral edema and right-sided hemiparesis, seizure Does the need for close, 24 hr/day medical supervision in concert with the patient's rehab needs make it unreasonable for this patient to be served in a less intensive setting? Yes Co-Morbidities requiring supervision/potential complications:  -Seizure disorder -Oncological treatment and plan -Peripheral artery disease -COPD Due to bladder management, bowel management, safety, skin/wound care, disease management, medication administration, pain management, and patient education, does the patient require 24 hr/day rehab nursing? Yes Does the patient require coordinated care of a physician, rehab nurse, therapy disciplines of PT, OT to address physical and functional deficits in the context of the above medical diagnosis(es)? Yes Addressing deficits in the following areas: balance, endurance, locomotion, strength, transferring, bowel/bladder control, bathing, dressing, feeding, grooming, toileting, and psychosocial support Can the patient actively participate in an intensive therapy program of at least 3 hrs of therapy per day at least 5 days per week? Yes The potential for patient to make  measurable gains while on inpatient rehab is excellent Anticipated functional outcomes upon discharge from inpatient rehab are modified independent and supervision  with PT, modified independent and supervision with OT, n/a with SLP. Estimated rehab length of stay to reach the above functional goals is: 7-10 days Anticipated discharge destination: Home Overall Rehab/Functional Prognosis: good  POST ACUTE RECOMMENDATIONS: This patient's condition is appropriate for continued rehabilitative care in the following setting:  CIR (see below) Patient has agreed to participate in recommended program. Potentially Note that insurance prior authorization may be required for reimbursement for recommended care.  Comment: Patient should do nicely on inpatient rehab but he should not be expecting to return home to the same situation he was in prior to this admission.  He needs some support at home, if for nothing else to help with his housework, groceries, etc. Rehab Admissions Coordinator to follow up.       I have personally performed a face to face diagnostic evaluation of this patient. Additionally, I have examined the patient's medical record including any pertinent labs and radiographic images. If the physician assistant has documented in this note, I have reviewed and edited or otherwise concur with the physician assistant's documentation.  Thanks,  Ranelle Oyster, MD 08/24/2023

## 2023-08-24 NOTE — Telephone Encounter (Signed)
Called patient to inform him of Dr. Barbaraann Cao appointment requested.   Patient is asking about upcoming appointments needing to be cancelled since he is on steroids. He is requesting a call back to let him know if they are needed.   Thank you

## 2023-08-24 NOTE — Progress Notes (Addendum)
Inpatient Rehab Coordinator Note:  I met with patient at bedside and called his Lourena Simmonds to discuss CIR recommendations and goals/expectations of CIR stay.  We reviewed 3 hrs/day of therapy, physician follow up, and average length of stay 2 weeks (dependent upon progress) with goals of mod I.  Aunt reports that she and other family members can provide intermittent assistance for patient but will not be there 24/7. Patient will likely progress to Mod I even if at wheelchair level. Consult pending. Will plan to proceed with prior authorization for this patient.  Rehab Admissons Coordinator [place], [place], [place] [phone]

## 2023-08-24 NOTE — Telephone Encounter (Signed)
Spoke to pt and he states he is still in the hospital. Asked him to call us once he is discharged so we can set up appts.

## 2023-08-24 NOTE — Progress Notes (Signed)
PROGRESS NOTE  CHEROKEE CLOWERS NWG:[id number] DOB: Jun 24, 1956 DOA: 08/21/2023 PCP: Jacky Kindle, FNP (Inactive)  HPI/Recap of past 24 hours: Joshua [name] is a 68 y.o. 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 At baseline patient has right-sided weakness and slurred speech which patient noticed has been worsening for the past week.  Patient discussed with Dr. Barbaraann Cao who prescribed p.o. Decadron, despite taking it, had no improvement, which may have led to his fall. Patient had been noticed to have jerking movements on his right side. In the ED at [facility] patient was seen as a code stroke. CT scan of the head noted increasing left frontal parietal edema and mass effect with 9 mm right midline shift likely relating to worsening metastatic disease.  Twitching improved after patient was given Keppra, Ativan, Vimpat, and Decadron.  Neurology consulted, recommended continuous EEG monitoring.  Patient admitted for further management.    Today, patient denies any new complaints.  Speech and right-sided weakness continues to improve. For CIR   Assessment/Plan: Principal Problem:   Seizure (HCC) Active Problems:   Cerebral edema (HCC)   Neoplasm causing mass effect and brain compression on adjacent structures (HCC)   Squamous cell carcinoma of right lung (HCC)   Malignant neoplasm metastatic to brain [facility])   PVD (peripheral vascular disease) (HCC)   Hypothyroidism due to medication   Hyperlipidemia, unspecified   ?Seizure Fall Cerebral edema with mass effect No further episodes of jerking movements noted CT of the brain revealed increasing left frontal parietal edema and mass effect with 9 mm right midline shift likely relating to worsening metastatic disease  MRI brain  w/wo contrast showed stable to minimally increased size of 2 left cerebral metastases since 08/05/2023. Worsening, extensive vasogenic edema with 7 mm of rightward midline shift.  No new metastasis Continuous EEG monitoring suggestive of cortical dysfunction arising from left temporal region, no seizure or epileptiform discharges were seen Neurology consulted, appreciate recs Oncologist Dr. Barbaraann Cao, recommended 4 mg of dexamethasone daily upon discharge Neurochecks, seizure precautions Switch to p.o. Decadron, continue Keppra PT/OT/speech- recommend CIR   Malignant neoplasm metastatic to brain Squamous cell carcinoma of the right lung Patient is currently being followed by Dr. Barbaraann Cao previously treated with chemotherapy and radiation treatments Patient also followed by Dr. Jayme Cloud of pulmonology Dr. Barbaraann Cao of medical oncology added to treatment team and informed  Hypernatremia Improved S/p IV fluid   Peripheral vascular disease Patient has a history of claudication status post prior bypass grafting and followed by Dr. Gilda Crease of vascular surgery. Continue aspirin, Plavix, and statin   COPD Stable Albuterol nebs as needed for shortness of breath/wheezing   Hyperlipidemia Continue Crestor   Hypothyroidism TSH WNL Continue levothyroxine      Estimated body mass index is 29.83 kg/m as calculated from the following:   Height as of this encounter: 5\' 11"  (1.803 m).   Weight as of this encounter: 97 kg.     Code Status: Full  Family Communication: None at bedside  Disposition Plan: Status is: Inpatient Remains inpatient appropriate because: Level of care      Consultants: Neurology  Procedures: Continuous EEG  Antimicrobials: None  DVT prophylaxis: Eliquis   Objective: Vitals:   08/23/23 1959 08/23/23 2316 08/24/23 0352 08/24/23 0733  BP: 132/64 (!) 126/59 138/68 134/83  Pulse: 77  75 72 63  Resp: 18 17 19 16   Temp: 97.8 F (36.6 C) 98 F (36.7 C) (!)  97.5 F (36.4 C) 97.7 F (36.5 C)  TempSrc: Oral Oral Oral Oral  SpO2: 98% 99% 98% 100%  Weight:      Height:        Intake/Output Summary (Last 24 hours) at 08/24/2023 1144 Last data filed at 08/24/2023 1029 Gross per 24 hour  Intake 10 ml  Output 1202 ml  Net -1192 ml   Filed Weights   08/21/23 1024 08/21/23 1831  Weight: 99.8 kg 97 kg    Exam: General: NAD, noted aphasia Cardiovascular: S1, S2 present Respiratory: CTAB Abdomen: Soft, nontender, nondistended, bowel sounds present Musculoskeletal: No bilateral pedal edema noted Skin: Normal Psychiatry: Normal mood  Neurology: 4/5 strength in right upper and lower extremity, 5/5 in left upper and lower extremity, sensation intact.    Data Reviewed: CBC: Recent Labs  Lab 08/20/23 2255 08/21/23 1736 08/23/23 0515  WBC 6.9 10.0 5.8  NEUTROABS 5.7  --  4.7  HGB 13.5 13.0 11.9*  HCT 42.6 39.9 37.0*  MCV 85.7 83.6 84.7  PLT 225 198 184   Basic Metabolic Panel: Recent Labs  Lab 08/20/23 2255 08/21/23 1736 08/22/23 0648 08/23/23 0515  NA 143 142 146* 141  K 4.4 3.9 4.1 3.8  CL 103 108 109 106  CO2 24 23 25 26   GLUCOSE 136* 121* 111* 151*  BUN 18 18 20 22   CREATININE 1.16 0.97 1.08 0.93  CALCIUM 9.4 9.0 9.2 8.5*  MG  --  2.2  --   --    GFR: Estimated Creatinine Clearance: 94.1 mL/min (by C-G formula based on SCr of 0.93 mg/dL). Liver Function Tests: Recent Labs  Lab 08/20/23 2255  AST 28  ALT 26  ALKPHOS 68  BILITOT 0.6  PROT 7.6  ALBUMIN 4.0   No results for input(s): "LIPASE", "AMYLASE" in the last 168 hours. No results for input(s): "AMMONIA" in the last 168 hours. Coagulation Profile: Recent Labs  Lab 08/20/23 2255  INR 1.0   Cardiac Enzymes: No results for input(s): "CKTOTAL", "CKMB", "CKMBINDEX", "TROPONINI" in the last 168 hours. BNP (last 3 results) No results for input(s): "PROBNP" in the last 8760 hours. HbA1C: No results for input(s): "HGBA1C" in the last 72 hours. CBG: Recent  Labs  Lab 08/20/23 2253  GLUCAP 143*   Lipid Profile: No results for input(s): "CHOL", "HDL", "LDLCALC", "TRIG", "CHOLHDL", "LDLDIRECT" in the last 72 hours. Thyroid Function Tests: Recent Labs    08/22/23 0648  TSH 2.888   Anemia Panel: No results for input(s): "VITAMINB12", "FOLATE", "FERRITIN", "TIBC", "IRON", "RETICCTPCT" in the last 72 hours. Urine analysis:    Component Value Date/Time   COLORURINE YELLOW (A) 11/21/2020 1150   APPEARANCEUR HAZY (A) 11/21/2020 1150   LABSPEC 1.014 11/21/2020 1150   PHURINE 5.0 11/21/2020 1150   GLUCOSEU NEGATIVE 11/21/2020 1150   HGBUR NEGATIVE 11/21/2020 1150   BILIRUBINUR NEGATIVE 11/21/2020 1150   KETONESUR NEGATIVE 11/21/2020 1150   PROTEINUR NEGATIVE 11/21/2020 1150   UROBILINOGEN 0.2 08/02/2009 1129   NITRITE NEGATIVE 11/21/2020 1150   LEUKOCYTESUR NEGATIVE 11/21/2020 1150   Sepsis Labs: @LABRCNTIP (procalcitonin:4,lacticidven:4)  )No results found for this or any previous visit (from the past 240 hours).    Studies: No results found.  Scheduled Meds:  aspirin EC  81 mg Oral Daily   Chlorhexidine Gluconate Cloth  6 each Topical Daily   clopidogrel  75 mg Oral Daily   [  START ON 08/25/2023] dexamethasone  4 mg Oral Daily   enoxaparin (LOVENOX) injection  40 mg Subcutaneous Q24H   levETIRAcetam  1,000 mg Oral BID   levothyroxine  125 mcg Oral QAC breakfast   rosuvastatin  40 mg Oral Daily   sodium chloride flush  10-40 mL Intracatheter Q12H    Continuous Infusions:     LOS: 3 days     Briant Cedar, MD [facility]  If 7PM-7AM, please contact night-coverage [email or web address] 08/24/2023, 11:44 AM

## 2023-08-25 DIAGNOSIS — R569 Unspecified convulsions: Secondary | ICD-10-CM | POA: Diagnosis not present

## 2023-08-25 NOTE — Progress Notes (Signed)
 Occupational Therapy Treatment Note  Pt seen to establish HEP for pt to continue to work on fine-motor/coordination and pharmacist, community. Pt very motivated and demonstrates excellent participation.     08/25/23 1713  OT Visit Information  Last OT Received On 08/25/23  Assistance Needed +1  History of Present Illness 68 yo male presents to [facility] ED 1/31 after a fall and seizure like activity. Transferred to [facility] for stroke work up. 1/28 pt reported increased R-sided weakness and slurred speech.MRI -  minimally increased size of 2 left cerebral metastases since 08/05/2023. Worsening, extensive vasogenic edema with 7 mm of rightward midline shift.  EEG suggestive of cortical dysfunction arising from left temporal region likely secondary to structural abnormality/ tumor. EFY:vljfnld cell carcinoma of the lung with brain metastasis (s/p chemo, radiation, and Imfinzi  immunotherapy), hypertension, PVD, COPD, hypothyroidism, and prior tobacco abuse.  Precautions  Precautions Fall  Precaution Comments R inattention  Pain Assessment  Pain Assessment No/denies pain  Cognition  Arousal Alert  Behavior During Therapy WFL for tasks assessed/performed  Overall Cognitive Status Impaired/Different from baseline  Area of Impairment Attention;Following commands;Awareness;Safety/judgement  Current Attention Level Selective  Memory Decreased short-term memory  Following Commands Follows multi-step commands with increased time  Safety/Judgement Decreased awareness of safety;Decreased awareness of deficits  Awareness Emergent  General Comments unsure of baseline; inattention noted with with some increased distractibility, easily redirectable  Upper Extremity Assessment  Upper Extremity Assessment Right hand dominant  RUE Deficits / Details strength R hand improving; incoordinated with decreased in-hand manipulation skills; attempting to use functionally; R inattention  RUE Sensation decreased  proprioception  Lower Extremity Assessment  Lower Extremity Assessment Defer to PT evaluation  Other Exercises  Other Exercises Theraputty exercises  Other Exercises fine motor coordination tasks - coins in putty; pushing through cup  Other Exercises in-hand manipulation tasks working on translation of objects in-hand  Other Exercises BUE integrated coordination tasks  Other Exercises issued wrod search puzzles to address eye-hand coordination, fine-motor skill and attention.  OT - End of Session  Equipment Utilized During Treatment Gait belt;Rolling walker (2 wheels)  Activity Tolerance Patient tolerated treatment well  Patient left in chair;with call bell/phone within reach;with chair alarm set  Nurse Communication Other (comment) (DC plan to AIR appropriate)  OT Assessment/Plan  OT Visit Diagnosis Unsteadiness on feet (R26.81);Other abnormalities of gait and mobility (R26.89);Muscle weakness (generalized) (M62.81);History of falling (Z91.81);Other symptoms and signs involving cognitive function  OT Frequency (ACUTE ONLY) Min 1X/week  Recommendations for Other Services Rehab consult  Follow Up Recommendations Acute inpatient rehab (3hours/day)  Patient can return home with the following Assistance with cooking/housework;Direct supervision/assist for medications management;Direct supervision/assist for financial management;Assist for transportation;Help with stairs or ramp for entrance;A little help with walking and/or transfers;A little help with bathing/dressing/bathroom  OT Equipment None recommended by OT  AM-PAC OT 6 Clicks Daily Activity Outcome Measure (Version 2)  Help from another person eating meals? 4  Help from another person taking care of personal grooming? 3  Help from another person toileting, which includes using toliet, bedpan, or urinal? 3  Help from another person bathing (including washing, rinsing, drying)? 3  Help from another person to put on and taking off  regular upper body clothing? 3  Help from another person to put on and taking off regular lower body clothing? 3  6 Click Score 19  Progressive Mobility  What is the highest level of mobility based on the progressive mobility assessment? Level 5 (Walks with assist in  room/hall) - Balance while stepping forward/back and can walk in room with assist - Complete  Mobility Referral Yes  Activity Ambulated with assistance in room  OT Goal Progression  Progress towards OT goals Progressing toward goals  Acute Rehab OT Goals  Patient Stated Goal continue to work with rehab  OT Goal Formulation With patient  Time For Goal Achievement 09/05/23  Potential to Achieve Goals Good  ADL Goals  Pt Will Perform Upper Body Bathing with set-up;with supervision;sitting  Pt Will Perform Lower Body Bathing with contact guard assist;sit to/from stand;with adaptive equipment  Pt Will Perform Lower Body Dressing with contact guard assist;sit to/from stand;with adaptive equipment  Pt Will Transfer to Toilet with contact guard assist;bedside commode;squat pivot transfer  Pt/caregiver will Perform Home Exercise Program Increased strength;Increased ROM;Right Upper extremity;With Supervision;With written HEP provided  OT Time Calculation  OT Start Time (ACUTE ONLY) 1601  OT Stop Time (ACUTE ONLY) 1615  OT Time Calculation (min) 14 min  OT General Charges  $OT Visit 1 Visit  OT Treatments  $Neuromuscular Re-education 8-22 mins   Kreg Sink, OT/L   Acute OT Clinical Specialist Acute Rehabilitation Services Pager [phone] Office [phone]

## 2023-08-25 NOTE — Progress Notes (Signed)
 Occupational Therapy Treatment Patient Details Name: Joshua [name] MRN: [id number] DOB: January 31, 1956 Today's Date: 08/25/2023   History of present illness 68 yo male presents to [facility] ED 1/31 after a fall and seizure like activity. Transferred to [facility] for stroke work up. 1/28 pt reported increased R-sided weakness and slurred speech.MRI -  minimally increased size of 2 left cerebral metastases since 08/05/2023. Worsening, extensive vasogenic edema with 7 mm of rightward midline shift.  EEG suggestive of cortical dysfunction arising from left temporal region likely secondary to structural abnormality/ tumor. EFY:vljfnld cell carcinoma of the lung with brain metastasis (s/p chemo, radiation, and Imfinzi  immunotherapy), hypertension, PVD, COPD, hypothyroidism, and prior tobacco abuse.   OT comments  Pt making steady progress toward goals. Feel pt could reach modified independent level with benefit from intensive inpatient follow-up therapy, >3 hours/day to facilitate safe DC home. Acute OT to follow.       If plan is discharge home, recommend the following:  Assistance with cooking/housework;Direct supervision/assist for medications management;Direct supervision/assist for financial management;Assist for transportation;Help with stairs or ramp for entrance;A little help with walking and/or transfers;A little help with bathing/dressing/bathroom   Equipment Recommendations  None recommended by OT    Recommendations for Other Services Rehab consult    Precautions / Restrictions Precautions Precautions: Fall Precaution Comments: R inattention Restrictions Weight Bearing Restrictions Per Provider Order: No       Mobility Bed Mobility               General bed mobility comments: OOB in chair    Transfers Overall transfer level: Needs assistance Equipment used: Rolling walker (2 wheels) Transfers: Sit to/from Stand Sit to Stand: Contact guard assist                 Balance  Overall balance assessment: History of Falls, Needs assistance Sitting-balance support: Feet supported Sitting balance-Leahy Scale: Fair     Standing balance support: During functional activity, No upper extremity supported Standing balance-Leahy Scale: Poor Standing balance comment: reliant on at least single UE support standing at sink for ADLS                           ADL either performed or assessed with clinical judgement   ADL Overall ADL's : Needs assistance/impaired Eating/Feeding: Modified independent   Grooming: Supervision/safety;Standing   Upper Body Bathing: Supervision/ safety;Set up;Sitting   Lower Body Bathing: Minimal assistance;Sit to/from stand   Upper Body Dressing : Minimal assistance;Sitting   Lower Body Dressing: Minimal assistance;Sit to/from stand   Toilet Transfer: Minimal assistance;Ambulation;Rolling walker (2 wheels)   Toileting- Clothing Manipulation and Hygiene: Contact guard assist       Functional mobility during ADLs: Minimal assistance;Cueing for safety;Rolling walker (2 wheels)      Extremity/Trunk Assessment Upper Extremity Assessment Upper Extremity Assessment: Right hand dominant RUE Deficits / Details: strength R hand improving; incoordinated with decreased in-hand manipulation skills; attempting to use functionally; R inattention RUE Coordination: decreased fine motor   Lower Extremity Assessment Lower Extremity Assessment: Defer to PT evaluation        Vision   Additional Comments: decreased visual attention   Perception Perception Perception: Impaired Preception Impairment Details: Inattention/Neglect Perception-Other Comments: R inattention   Praxis      Cognition Arousal: Alert Behavior During Therapy: WFL for tasks assessed/performed Overall Cognitive Status: Impaired/Different from baseline Area of Impairment: Attention, Following commands, Awareness, Safety/judgement  Current Attention Level: Selective Memory: Decreased short-term memory Following Commands: Follows multi-step commands with increased time Safety/Judgement: Decreased awareness of safety, Decreased awareness of deficits Awareness: Emergent   General Comments: unsure of baseline; inattention noted with with some increased distractibility, easily redirectable        Exercises      Shoulder Instructions       General Comments      Pertinent Vitals/ Pain       Pain Assessment Pain Assessment: No/denies pain  Home Living                                          Prior Functioning/Environment              Frequency  Min 1X/week        Progress Toward Goals  OT Goals(current goals can now be found in the care plan section)  Progress towards OT goals: Progressing toward goals  Acute Rehab OT Goals Patient Stated Goal: to continue to get better OT Goal Formulation: With patient Time For Goal Achievement: 09/05/23 Potential to Achieve Goals: Good ADL Goals Pt Will Perform Upper Body Bathing: with set-up;with supervision;sitting Pt Will Perform Lower Body Bathing: with contact guard assist;sit to/from stand;with adaptive equipment Pt Will Perform Lower Body Dressing: with contact guard assist;sit to/from stand;with adaptive equipment Pt Will Transfer to Toilet: with contact guard assist;bedside commode;squat pivot transfer Pt/caregiver will Perform Home Exercise Program: Increased strength;Increased ROM;Right Upper extremity;With Supervision;With written HEP provided  Plan      Co-evaluation                 AM-PAC OT 6 Clicks Daily Activity     Outcome Measure   Help from another person eating meals?: None Help from another person taking care of personal grooming?: A Little Help from another person toileting, which includes using toliet, bedpan, or urinal?: A Little Help from another person bathing (including washing, rinsing,  drying)?: A Little Help from another person to put on and taking off regular upper body clothing?: A Little Help from another person to put on and taking off regular lower body clothing?: A Little 6 Click Score: 19    End of Session Equipment Utilized During Treatment: Gait belt;Rolling walker (2 wheels)  OT Visit Diagnosis: Unsteadiness on feet (R26.81);Other abnormalities of gait and mobility (R26.89);Muscle weakness (generalized) (M62.81);History of falling (Z91.81);Other symptoms and signs involving cognitive function   Activity Tolerance Patient tolerated treatment well   Patient Left in chair;with call bell/phone within reach;with chair alarm set   Nurse Communication Mobility status        Time: 1511-1530 OT Time Calculation (min): 19 min  Charges: OT General Charges $OT Visit: 1 Visit OT Treatments $Self Care/Home Management : 8-22 mins  Kreg Sink, OT/L   Acute OT Clinical Specialist Acute Rehabilitation Services Pager [phone] Office [phone]   [facility] 08/25/2023, 5:08 PM

## 2023-08-25 NOTE — Plan of Care (Signed)
  Problem: Education: Goal: Knowledge of General Education information will improve Description: Including pain rating scale, medication(s)/side effects and non-pharmacologic comfort measures Outcome: Progressing   Problem: Health Behavior/Discharge Planning: Goal: Ability to manage health-related needs will improve Outcome: Progressing   Problem: Clinical Measurements: Goal: Ability to maintain clinical measurements within normal limits will improve Outcome: Progressing Goal: Will remain free from infection Outcome: Progressing Goal: Diagnostic test results will improve Outcome: Progressing Goal: Respiratory complications will improve Outcome: Progressing Goal: Cardiovascular complication will be avoided Outcome: Progressing   Problem: Activity: Goal: Risk for activity intolerance will decrease Outcome: Progressing   Problem: Nutrition: Goal: Adequate nutrition will be maintained Outcome: Progressing   Problem: Coping: Goal: Level of anxiety will decrease Outcome: Progressing   Problem: Elimination: Goal: Will not experience complications related to bowel motility Outcome: Progressing Goal: Will not experience complications related to urinary retention Outcome: Progressing   Problem: Pain Managment: Goal: General experience of comfort will improve and/or be controlled Outcome: Progressing   Problem: Safety: Goal: Ability to remain free from injury will improve Outcome: Progressing   Problem: Skin Integrity: Goal: Risk for impaired skin integrity will decrease Outcome: Progressing   Problem: Education: Goal: Expressions of having a comfortable level of knowledge regarding the disease process will increase Outcome: Progressing   Problem: Coping: Goal: Ability to adjust to condition or change in health will improve Outcome: Progressing Goal: Ability to identify appropriate support needs will improve Outcome: Progressing   Problem: Health Behavior/Discharge  Planning: Goal: Compliance with prescribed medication regimen will improve Outcome: Progressing   Problem: Medication: Goal: Risk for medication side effects will decrease Outcome: Progressing   Problem: Clinical Measurements: Goal: Complications related to the disease process, condition or treatment will be avoided or minimized Outcome: Progressing Goal: Diagnostic test results will improve Outcome: Progressing   Problem: Safety: Goal: Verbalization of understanding the information provided will improve Outcome: Progressing   Problem: Self-Concept: Goal: Level of anxiety will decrease Outcome: Progressing Goal: Ability to verbalize feelings about condition will improve Outcome: Progressing

## 2023-08-25 NOTE — Progress Notes (Signed)
 PROGRESS NOTE  Joshua [name] FMW:[id number] DOB: June 08, 1956 DOA: 08/21/2023 PCP: Emilio Kelly DASEN, FNP (Inactive)  HPI/Recap of past 24 hours: Joshua [name] is a 68 y.o. 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 At baseline patient has right-sided weakness and slurred speech which patient noticed has been worsening for the past week.  Patient discussed with [name] who prescribed p.o. Decadron , despite taking it, had no improvement, which may have led to his fall. Patient had been noticed to have jerking movements on his right side. In the ED at [facility] patient was seen as a code stroke. CT scan of the head noted increasing left frontal parietal edema and mass effect with 9 mm right midline shift likely relating to worsening metastatic disease.  Twitching improved after patient was given Keppra , Ativan , Vimpat , and Decadron .  Neurology consulted, recommended continuous EEG monitoring.  Patient admitted for further management.    Today, patient denies any new complaints. Awaiting insurance approval   Assessment/Plan: Principal Problem:   Seizure (HCC) Active Problems:   Cerebral edema (HCC)   Neoplasm causing mass effect and brain compression on adjacent structures (HCC)   Squamous cell carcinoma of right lung (HCC)   Malignant neoplasm metastatic to brain [facility])   PVD (peripheral vascular disease) (HCC)   Hypothyroidism due to medication   Hyperlipidemia, unspecified   ?Seizure Fall Cerebral edema with mass effect No further episodes of jerking movements noted CT of the brain revealed increasing left frontal parietal edema and mass effect with 9 mm right midline shift likely relating to worsening metastatic disease  MRI brain w/wo contrast showed stable to  minimally increased size of 2 left cerebral metastases since 08/05/2023. Worsening, extensive vasogenic edema with 7 mm of rightward midline shift.  No new metastasis Continuous EEG monitoring suggestive of cortical dysfunction arising from left temporal region, no seizure or epileptiform discharges were seen Neurology consulted, appreciate recs Oncologist [name], recommended 4 mg of dexamethasone  daily upon discharge Neurochecks, seizure precautions Switch to p.o. Decadron , continue Keppra  PT/OT/speech- recommend CIR   Malignant neoplasm metastatic to brain Squamous cell carcinoma of the right lung Patient is currently being followed by [name] previously treated with chemotherapy and radiation treatments Patient also followed by Dr. Tamea of pulmonology [name] of medical oncology added to treatment team and informed  Hypernatremia Improved S/p IV fluid   Peripheral vascular disease Patient has a history of claudication status post prior bypass grafting and followed by Dr. Jama of vascular surgery. Continue aspirin , Plavix , and statin   COPD Stable Albuterol  nebs as needed for shortness of breath/wheezing   Hyperlipidemia Continue Crestor    Hypothyroidism TSH WNL Continue levothyroxine       Estimated body mass index is 29.83 kg/m as calculated from the following:   Height as of this encounter: 5' 11 (1.803 m).   Weight as of this encounter: 97 kg.     Code Status: Full  Family Communication: None at bedside  Disposition Plan: Status is: Inpatient Remains inpatient appropriate because: Level of care      Consultants: Neurology  Procedures: Continuous EEG  Antimicrobials: None  DVT prophylaxis: Eliquis    Objective: Vitals:   08/24/23 1900 08/24/23 2339 08/25/23 0346 08/25/23 0754  BP: 130/70 (!) 142/78 (!) 145/80 (!) 141/74  Pulse: 70 80 69 64  Resp:  16 16 16 16   Temp: 98 F (36.7 C) 97.7 F (36.5 C) 97.8 F (36.6 C) 98.1 F  (36.7 C)  TempSrc: Oral Oral Oral Oral  SpO2: 98% 100% 98% 99%  Weight:      Height:        Intake/Output Summary (Last 24 hours) at 08/25/2023 1057 Last data filed at 08/25/2023 0900 Gross per 24 hour  Intake 240 ml  Output 1800 ml  Net -1560 ml   Filed Weights   08/21/23 1024 08/21/23 1831  Weight: 99.8 kg 97 kg    Exam: General: NAD, noted aphasia Cardiovascular: S1, S2 present Respiratory: CTAB Abdomen: Soft, nontender, nondistended, bowel sounds present Musculoskeletal: No bilateral pedal edema noted Skin: Normal Psychiatry: Normal mood  Neurology: 4/5 strength in right upper and lower extremity, 5/5 in left upper and lower extremity, sensation intact.    Data Reviewed: CBC: Recent Labs  Lab 08/20/23 2255 08/21/23 1736 08/23/23 0515  WBC 6.9 10.0 5.8  NEUTROABS 5.7  --  4.7  HGB 13.5 13.0 11.9*  HCT 42.6 39.9 37.0*  MCV 85.7 83.6 84.7  PLT 225 198 184   Basic Metabolic Panel: Recent Labs  Lab 08/20/23 2255 08/21/23 1736 08/22/23 0648 08/23/23 0515  NA 143 142 146* 141  K 4.4 3.9 4.1 3.8  CL 103 108 109 106  CO2 24 23 25 26   GLUCOSE 136* 121* 111* 151*  BUN 18 18 20 22   CREATININE 1.16 0.97 1.08 0.93  CALCIUM  9.4 9.0 9.2 8.5*  MG  --  2.2  --   --    GFR: Estimated Creatinine Clearance: 94.1 mL/min (by C-G formula based on SCr of 0.93 mg/dL). Liver Function Tests: Recent Labs  Lab 08/20/23 2255  AST 28  ALT 26  ALKPHOS 68  BILITOT 0.6  PROT 7.6  ALBUMIN  4.0   No results for input(s): LIPASE, AMYLASE in the last 168 hours. No results for input(s): AMMONIA in the last 168 hours. Coagulation Profile: Recent Labs  Lab 08/20/23 2255  INR 1.0   Cardiac Enzymes: No results for input(s): CKTOTAL, CKMB, CKMBINDEX, TROPONINI in the last 168 hours. BNP (last 3 results) No results for input(s): PROBNP in the last 8760 hours. HbA1C: No results for input(s): HGBA1C in the last 72 hours. CBG: Recent Labs  Lab  08/20/23 2253  GLUCAP 143*   Lipid Profile: No results for input(s): CHOL, HDL, LDLCALC, TRIG, CHOLHDL, LDLDIRECT in the last 72 hours. Thyroid  Function Tests: No results for input(s): TSH, T4TOTAL, FREET4, T3FREE, THYROIDAB in the last 72 hours.  Anemia Panel: No results for input(s): VITAMINB12, FOLATE, FERRITIN, TIBC, IRON, RETICCTPCT in the last 72 hours. Urine analysis:    Component Value Date/Time   COLORURINE YELLOW (A) 11/21/2020 1150   APPEARANCEUR HAZY (A) 11/21/2020 1150   LABSPEC 1.014 11/21/2020 1150   PHURINE 5.0 11/21/2020 1150   GLUCOSEU NEGATIVE 11/21/2020 1150   HGBUR NEGATIVE 11/21/2020 1150   BILIRUBINUR NEGATIVE 11/21/2020 1150   KETONESUR NEGATIVE 11/21/2020 1150   PROTEINUR NEGATIVE 11/21/2020 1150   UROBILINOGEN 0.2 08/02/2009 1129   NITRITE NEGATIVE 11/21/2020 1150   LEUKOCYTESUR NEGATIVE 11/21/2020 1150   Sepsis Labs: @LABRCNTIP (procalcitonin:4,lacticidven:4)  )No results found for this or any previous visit (from the past 240 hours).    Studies: No results found.  Scheduled Meds:  aspirin  EC  81 mg Oral Daily   Chlorhexidine  Gluconate Cloth  6 each Topical Daily   clopidogrel   75 mg Oral Daily   dexamethasone   4  mg Oral Daily   enoxaparin  (LOVENOX ) injection  40 mg Subcutaneous Q24H   levETIRAcetam   1,000 mg Oral BID   levothyroxine   125 mcg Oral QAC breakfast   rosuvastatin   40 mg Oral Daily   sodium chloride  flush  10-40 mL Intracatheter Q12H    Continuous Infusions:     LOS: 4 days     Lebron JINNY Cage, MD [facility]  If 7PM-7AM, please contact night-coverage [email or web address] 08/25/2023, 10:57 AM

## 2023-08-25 NOTE — Plan of Care (Signed)
   Problem: Activity: Goal: Risk for activity intolerance will decrease Outcome: Progressing   Problem: Nutrition: Goal: Adequate nutrition will be maintained Outcome: Progressing   Problem: Safety: Goal: Ability to remain free from injury will improve Outcome: Progressing   Problem: Skin Integrity: Goal: Risk for impaired skin integrity will decrease Outcome: Progressing

## 2023-08-25 NOTE — Progress Notes (Signed)
 Physical Therapy Treatment Patient Details Name: Joshua [name] MRN: [id number] DOB: 11/14/1955 Today's Date: 08/25/2023   History of Present Illness 68 yo male presents to [facility] ED 1/31 after a fall and seizure like activity. Transferred to [facility] for stroke work up. 1/28 pt reported increased R-sided weakness and slurred speech.MRI -  minimally increased size of 2 left cerebral metastases since 08/05/2023. Worsening, extensive vasogenic edema with 7 mm of rightward midline shift.  EEG suggestive of cortical dysfunction arising from left temporal region likely secondary to structural abnormality/ tumor. EFY:vljfnld cell carcinoma of the lung with brain metastasis (s/p chemo, radiation, and Imfinzi  immunotherapy), hypertension, PVD, COPD, hypothyroidism, and prior tobacco abuse.    PT Comments  Pt resting in bed on arrival and agreeable to session with continued progress towards acute goals. Pt progressing gait tolerance this session, however continues to be limited by LE weakness R>L, impaired balance/postural reactions, and decreased activity tolerance. Pt requiring supervision to come to sitting EOB with HOB elevated and use of bed features, CGA for transfers and grossly CGA for gait with RW for support. Pt continues to benefit from cues for improved gait mechanics and posture (see below) however demonstrating ability to correct but demonstrating difficulty maintaining. Pt was educated on continued walker use to maximize functional independence, safety, and decrease risk for falls.  Pt continues to benefit from skilled PT services to progress toward functional mobility goals.      If plan is discharge home, recommend the following: Assistance with cooking/housework;Assist for transportation;Help with stairs or ramp for entrance;A little help with walking and/or transfers;A little help with bathing/dressing/bathroom   Can travel by private vehicle        Equipment Recommendations  Other (comment)  (defer to next venue)    Recommendations for Other Services       Precautions / Restrictions Precautions Precautions: Fall Precaution Comments: R inattention Restrictions Weight Bearing Restrictions Per Provider Order: No     Mobility  Bed Mobility Overal bed mobility: Needs Assistance Bed Mobility: Supine to Sit     Supine to sit: Supervision, HOB elevated, Used rails     General bed mobility comments: supervision for safety, increased time and effortful    Transfers Overall transfer level: Needs assistance Equipment used: Rolling walker (2 wheels) Transfers: Sit to/from Stand Sit to Stand: Contact guard assist           General transfer comment: CGA for safety, increased time to recall hand safe placement but able without cues, good eccentric control to lower to stting    Ambulation/Gait Ambulation/Gait assistance: Contact guard assist Gait Distance (Feet): 250 Feet Assistive device: Rolling walker (2 wheels), None Gait Pattern/deviations: Decreased step length - right, Decreased stride length, Decreased dorsiflexion - right, Decreased step length - left Gait velocity: decreased     General Gait Details: some improved RLE clearance, continued poor active DF with absent heel strike, cues for wider BOS as pt adducting RLE on swing and close to scissoring feet. able to correct but unable to maintain, pt with improved posture with repeated cues to step more inside RW frame, pt reporting at baseline his tray blocks ability to be inside RW however tray is removable   Stairs             Wheelchair Mobility     Tilt Bed    Modified Rankin (Stroke Patients Only)       Balance Overall balance assessment: History of Falls, Needs assistance Sitting-balance support: Feet supported Sitting  balance-Leahy Scale: Fair     Standing balance support: During functional activity, No upper extremity supported Standing balance-Leahy Scale: Poor Standing balance  comment: reliant on at least single UE support standing at sink for ADLS                            Cognition Arousal: Alert Behavior During Therapy: WFL for tasks assessed/performed Overall Cognitive Status: Impaired/Different from baseline Area of Impairment: Attention, Following commands, Awareness, Safety/judgement                   Current Attention Level: Selective Memory: Decreased short-term memory Following Commands: Follows one step commands consistently Safety/Judgement: Decreased awareness of safety, Decreased awareness of deficits Awareness: Emergent   General Comments: unsure of baseline; inattention noted with with some increased distractibility, easily redirectable        Exercises      General Comments General comments (skin integrity, edema, etc.): VSS on RA      Pertinent Vitals/Pain Pain Assessment Pain Assessment: No/denies pain    Home Living                          Prior Function            PT Goals (current goals can now be found in the care plan section) Acute Rehab PT Goals PT Goal Formulation: With patient Time For Goal Achievement: 09/05/23 Progress towards PT goals: Progressing toward goals    Frequency    Min 1X/week      PT Plan      Co-evaluation              AM-PAC PT 6 Clicks Mobility   Outcome Measure  Help needed turning from your back to your side while in a flat bed without using bedrails?: None Help needed moving from lying on your back to sitting on the side of a flat bed without using bedrails?: A Little Help needed moving to and from a bed to a chair (including a wheelchair)?: A Little Help needed standing up from a chair using your arms (e.g., wheelchair or bedside chair)?: A Little Help needed to walk in hospital room?: A Little Help needed climbing 3-5 steps with a railing? : A Lot 6 Click Score: 18    End of Session Equipment Utilized During Treatment: Gait  belt Activity Tolerance: Patient tolerated treatment well Patient left: in chair;with chair alarm set;with call bell/phone within reach Nurse Communication: Mobility status PT Visit Diagnosis: Unsteadiness on feet (R26.81);Repeated falls (R29.6);Muscle weakness (generalized) (M62.81);Difficulty in walking, not elsewhere classified (R26.2);Other symptoms and signs involving the nervous system (R29.898)     Time: 9042-8971 PT Time Calculation (min) (ACUTE ONLY): 31 min  Charges:    $Gait Training: 8-22 mins $Therapeutic Activity: 8-22 mins PT General Charges $$ ACUTE PT VISIT: 1 Visit                     Goldy Calandra R. PTA Acute Rehabilitation Services Office: [phone]   Therisa CHRISTELLA Boor 08/25/2023, 10:45 AM

## 2023-08-25 NOTE — Progress Notes (Signed)
Inpatient Rehabilitation Admissions Coordinator   I met at bedside with patient . I await insurance determination for a possible [facility] admit.   Ottie Glazier, RN, MSN Rehab Admissions Coordinator [phone] 08/25/2023 11:55 AM

## 2023-08-26 ENCOUNTER — Other Ambulatory Visit (HOSPITAL_COMMUNITY): Payer: Self-pay

## 2023-08-26 DIAGNOSIS — R569 Unspecified convulsions: Secondary | ICD-10-CM | POA: Diagnosis not present

## 2023-08-26 LAB — BASIC METABOLIC PANEL
Anion gap: 7 (ref 5–15)
BUN: 22 mg/dL (ref 8–23)
CO2: 28 mmol/L (ref 22–32)
Calcium: 8.7 mg/dL — ABNORMAL LOW (ref 8.9–10.3)
Chloride: 105 mmol/L (ref 98–111)
Creatinine, Ser: 0.84 mg/dL (ref 0.61–1.24)
GFR, Estimated: >60 mL/min (ref >60)
Glucose, Bld: 103 mg/dL — ABNORMAL HIGH (ref 70–99)
Potassium: 4.2 mmol/L (ref 3.5–5.1)
Sodium: 140 mmol/L (ref 135–145)

## 2023-08-26 LAB — CBC
HCT: 38.1 % — ABNORMAL LOW (ref 39.0–52.0)
Hemoglobin: 12.5 g/dL — ABNORMAL LOW (ref 13.0–17.0)
MCH: 27.5 pg (ref 26.0–34.0)
MCHC: 32.8 g/dL (ref 30.0–36.0)
MCV: 83.9 fL (ref 80.0–100.0)
Platelets: 222 10*3/uL (ref 150–400)
RBC: 4.54 MIL/uL (ref 4.22–5.81)
RDW: 16.1 % — ABNORMAL HIGH (ref 11.5–15.5)
WBC: 6.9 10*3/uL (ref 4.0–10.5)
nRBC: 0 % (ref 0.0–0.2)

## 2023-08-26 MED ORDER — LEVETIRACETAM 1000 MG PO TABS
1000.0000 mg | ORAL_TABLET | Freq: Two times a day (BID) | ORAL | 0 refills | Status: DC
  Filled 2023-08-26 (×2): qty 60, 30d supply, fill #0

## 2023-08-26 MED ORDER — HEPARIN SOD (PORK) LOCK FLUSH 100 UNIT/ML IV SOLN
500.0000 [IU] | INTRAVENOUS | Status: AC | PRN
  Administered 2023-08-26: 500 [IU]

## 2023-08-26 MED ORDER — DEXAMETHASONE 4 MG PO TABS
4.0000 mg | ORAL_TABLET | Freq: Every day | ORAL | 0 refills | Status: DC
  Filled 2023-08-26 – 2023-09-03 (×3): qty 30, 30d supply, fill #0

## 2023-08-26 NOTE — Discharge Summary (Signed)
 Physician Discharge Summary   Patient: Joshua [name] MRN: [id number] DOB: [birth year]  Admit date:     08/21/2023  Discharge date: 08/26/23  Discharge Physician: Lebron JINNY Cage   PCP: Emilio Kelly DASEN, FNP (Inactive)   Recommendations at discharge:   Follow-up with PCP Follow-up with oncology Follow-up with neurology  Discharge Diagnoses: Principal Problem:   Seizure [facility]) Active Problems:   Cerebral edema (HCC)   Neoplasm causing mass effect and brain compression on adjacent structures (HCC)   Squamous cell carcinoma of right lung (HCC)   Malignant neoplasm metastatic to brain [facility])   PVD (peripheral vascular disease) (HCC)   Hypothyroidism due to medication   Hyperlipidemia, unspecified    Hospital Course: Joshua [name] is a 68 y.o. 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 At baseline patient has right-sided weakness and slurred speech which patient noticed has been worsening for the past week.  Patient discussed with [name] who prescribed p.o. Decadron , despite taking it, had no improvement, which may have led to his fall. Patient had been noticed to have jerking movements on his right side. In the ED at [facility] patient was seen as a code stroke. CT scan of the head noted increasing left frontal parietal edema and mass effect with 9 mm right midline shift likely relating to worsening metastatic disease.  Twitching improved after patient was given Keppra , Ativan , Vimpat , and Decadron .  Neurology consulted, recommended continuous EEG monitoring.  Patient admitted for further management.     Today, patient denies any new complaints. Eager to be discharged home.  Plan for Home health upon discharge    Assessment and Plan: Seizure Fall Cerebral edema  with mass effect No further episodes of jerking movements noted CT of the brain revealed increasing left frontal parietal edema and mass effect with 9 mm right midline shift likely relating to worsening metastatic disease  MRI brain w/wo contrast showed stable to minimally increased size of 2 left cerebral metastases since 08/05/2023. Worsening, extensive vasogenic edema with 7 mm of rightward midline shift.  No new metastasis Continuous EEG monitoring suggestive of cortical dysfunction arising from left temporal region, no seizure or epileptiform discharges were seen Neurology consulted, appreciate recs, signed off Oncologist [name], recommended 4 mg of dexamethasone  daily upon discharge Discharge on PO Decadron , Keppra  Neurology and oncology follow up [facility] PT/OT   Malignant neoplasm metastatic to brain Squamous cell carcinoma of the right lung Patient is currently being followed by [name] previously treated with chemotherapy and radiation treatments Patient also followed by Dr. Tamea of pulmonology [name] of medical oncology added to treatment team and informed   Hypernatremia Improved S/p IV fluid   Peripheral vascular disease Patient has a history of claudication status post prior bypass grafting and followed by Dr. Jama of vascular surgery. Continue aspirin , Plavix , and statin   COPD Stable Albuterol  nebs as needed for shortness of breath/wheezing   Hyperlipidemia Continue Crestor    Hypothyroidism TSH WNL Continue levothyroxine       Consultants: Neurology Procedures performed: None Disposition: Home health Diet recommendation:  Regular diet   DISCHARGE MEDICATION: Allergies as of 08/26/2023   No Known Allergies      Medication List     TAKE these medications    aspirin  EC 81 MG tablet Take 81 mg by mouth daily.  Swallow whole.   clopidogrel  75 MG tablet Commonly known as: PLAVIX  TAKE 1 TABLET BY MOUTH DAILY   dexamethasone  4 MG  tablet Commonly known as: DECADRON  Take 1 tablet (4 mg total) by mouth daily. Start taking on: August 27, 2023 What changed: how much to take   levETIRAcetam  1000 MG tablet Commonly known as: KEPPRA  Take 1 tablet (1,000 mg total) by mouth 2 (two) times daily.   levothyroxine  125 MCG tablet Commonly known as: Synthroid  Take 1 tablet (125 mcg total) by mouth daily before breakfast.   rosuvastatin  40 MG tablet Commonly known as: CRESTOR  Take 1 tablet (40 mg total) by mouth daily.   Vitamin D  (Ergocalciferol ) 1.25 MG (50000 UNIT) Caps capsule Commonly known as: DRISDOL  Take 1 capsule (50,000 Units total) by mouth every 7 (seven) days. What changed: additional instructions        Discharge Exam: Filed Weights   08/21/23 1024 08/21/23 1831  Weight: 99.8 kg 97 kg   General: NAD  Cardiovascular: S1, S2 present Respiratory: CTAB Abdomen: Soft, nontender, nondistended, bowel sounds present Musculoskeletal: No bilateral pedal edema noted Skin: Normal Psychiatry: Normal mood   Condition at discharge: stable  The results of significant diagnostics from this hospitalization (including imaging, microbiology, ancillary and laboratory) are listed below for reference.   Imaging Studies: MR BRAIN W WO CONTRAST Result Date: 08/21/2023 CLINICAL DATA:  Metastatic disease evaluation. History of metastatic lung cancer. EXAM: MRI HEAD WITHOUT AND WITH CONTRAST TECHNIQUE: Multiplanar, multiecho pulse sequences of the brain and surrounding structures were obtained without and with intravenous contrast. CONTRAST:  10mL GADAVIST  GADOBUTROL  1 MMOL/ML IV SOLN COMPARISON:  Head CT 08/20/2023 and MRI 08/05/2023 FINDINGS: Brain: Heterogeneously enhancing, partially necrotic lesions measuring up to 2.7 cm in the medial aspect of the left postcentral gyrus (series 11, image 47 and series 12, image 14) and 2.1 cm in the lateral aspect of the left precentral gyrus (series 11, image 33 and series 12, image  20) are stable to minimally larger than on the recent prior MRI. Extensive vasogenic edema in the left frontal and parietal lobes has mildly worsened from the prior MRI with increased rightward midline shift now measuring 7 mm. Mild dural enhancement along the left parietal convexity overlying the postcentral gyrus lesion is unchanged. A 1 cm peripherally enhancing lesion in the right occipital lobe is unchanged in size (series 11, image 23) with minimal surrounding edema which has slightly increased. A punctate focus of faint residual enhancement corresponding to a known left cerebellar lesion is unchanged (series 11, image 13). No acute infarct or extra-axial fluid collection is evident. There are chronic blood products associated with the left cerebral lesions. Patchy T2 hyperintensities elsewhere in the cerebral white matter bilaterally are unchanged and nonspecific but compatible with mild-to-moderate chronic small vessel ischemic disease. There is partial effacement of the left lateral ventricle. There is no evidence of hydrocephalus. No new enhancing brain lesions are identified. Vascular: Major intracranial vascular flow voids are preserved. Skull and upper cervical spine: No suspicious marrow lesion. Sinuses/Orbits: Unremarkable orbits. Mild mucosal thickening in the paranasal sinuses. Clear mastoid air cells. Other: None. IMPRESSION: 1. Stable to minimally increased size of 2 left cerebral metastases since 08/05/2023. Worsening, extensive vasogenic edema with 7 mm of rightward midline shift. 2. Unchanged size of small right occipital and left cerebellar metastases. 3. No new metastases. Electronically Signed   By: Dasie Hamburg M.D.   On: 08/21/2023 10:30   Overnight EEG with video Result Date: 08/21/2023 Yadav, Priyanka  MALVA, MD     08/22/2023  7:25 AM Patient Name: Joshua [name] MRN: [id number] Epilepsy Attending: Arlin MALVA Krebs Referring Physician/Provider: Khaliqdina, Salman, MD Duration: 08/21/2023  0420 to 2304 Patient history: 68 y.o. 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 ED by EMS worsening of his baseline deficit with a last known well of 9:30 PM.  He was noted to have twitching of his right side concerning for a seizure. EEG to evaluate for seizure Level of alertness: Awake, asleep AEDs during EEG study: LEV Technical aspects: This EEG study was done with scalp electrodes positioned according to the 10-20 International system of electrode placement. Electrical activity was reviewed with band pass filter of 1-70Hz , sensitivity of 7 uV/mm, display speed of 81mm/sec with a 60Hz  notched filter applied as appropriate. EEG data were recorded continuously and digitally stored.  Video monitoring was available and reviewed as appropriate. Description: The posterior dominant rhythm consists of 9 Hz activity of moderate voltage (25-35 uV) seen predominantly in posterior head regions, symmetric and reactive to eye opening and eye closing. Sleep was characterized by vertex waves, sleep spindles (12 to 14 Hz), maximal frontocentral region. EEG showed intermittent 3 to 5 Hz theta-delta slowing in left temporal region. Hyperventilation and photic stimulation were not performed.  EEG was disconnected between 0906 to 1001 and between 1554 to 1845 ABNORMALITY - Intermittent slow, left temporal region. IMPRESSION: This study is suggestive of cortical dysfunction arising from left temporal region likely secondary to underlying structural abnormality/ tumor. No seizures or epileptiform discharges were seen throughout the recording. Arlin MALVA Krebs   CT HEAD CODE STROKE WO CONTRAST Result Date: 08/20/2023 CLINICAL DATA:  Code stroke.  Neuro deficit, acute, stroke suspected EXAM: CT HEAD WITHOUT CONTRAST TECHNIQUE: Contiguous axial images were  obtained from the base of the skull through the vertex without intravenous contrast. RADIATION DOSE REDUCTION: This exam was performed according to the departmental dose-optimization program which includes automated exposure control, adjustment of the mA and/or kV according to patient size and/or use of iterative reconstruction technique. COMPARISON:  None Available. FINDINGS: Motion limited study.  Within this limitation: Brain: Increasing edema in the left frontoparietal region. No definite new acute hemorrhage. Small focus of hyperdensity in the left frontal region likely represents mineralization or hemorrhage with in the known metastasis, likely present on prior MRI. No obvious superimposed acute large vascular territory infarct. No hydrocephalus. Vascular: Not well assessed. Skull: No acute fracture. Sinuses/Orbits: Clear sinuses.  No acute orbital findings. Other: No mastoid effusions. IMPRESSION: Increasing left frontoparietal edema and mass effect with 9 mm of right midline shift, likely related to worsening metastatic disease given findings on recent MRI head. Repeat MRI head with contrast could confirm and better assess if clinically warranted. Code stroke imaging results were communicated on 08/20/2023 at 11:15 pm to provider [facility] via telephone, who verbally acknowledged these results. Electronically Signed   By: Gilmore GORMAN Molt M.D.   On: 08/20/2023 23:20   MR BRAIN W WO CONTRAST Result Date: 08/05/2023 CLINICAL DATA:  Metastatic lung cancer, restaging EXAM: MRI HEAD WITHOUT AND WITH CONTRAST TECHNIQUE: Multiplanar, multiecho pulse sequences of the brain and surrounding structures were obtained without and with intravenous contrast. CONTRAST:  9mL GADAVIST  GADOBUTROL  1 MMOL/ML IV SOLN COMPARISON:  07/03/2023 FINDINGS: Brain: Redemonstrated peripherally enhancing mass in the medial left postcentral gyrus, which  measures up to 2.4 x 2.2 x 1.8 cm (AP x TR x CC) (series 18, image 26 and series 16,  image 143), previously 2.0 x 2.1 x 1.8 cm when remeasured similarly. Increased associated T2 hyperintense signal (series 9, image 59). Enhancing mass in the left precentral gyrus measures 1.6 x 1.3 x 1.7 cm (AP x TR x CC) (series 16, image 112 and series 18, image 32), previously 1.5 x 1.3 x 1.8 cm, overall unchanged. Unchanged surrounding T2 hyperintense signal. Minimal peripheral enhancement about the lesion in the right occipital lobe, which measures up to 0.9 x 0.8 x 0.6 cm (AP x TR x CC) (series 16, image 62 and series 18, image 12), previously 1.0 x 1.1 x 0.8 cm. Decreased surrounding T2 hyperintense signal. Diminishing enhancement in a previously noted left cerebellar lesion (series 16, image 43). No new abnormal enhancement. No acute infarct, hemorrhage, or midline shift. No hydrocephalus or extra-axial collection. Vascular: Normal arterial flow voids. Normal arterial and venous enhancement. Skull and upper cervical spine: Normal marrow signal. Sinuses/Orbits: Clear paranasal sinuses. No acute finding in the orbits. Other: The mastoid air cells are well aerated. IMPRESSION: 1. Slight interval increase in size of the peripherally enhancing mass in the medial left postcentral gyrus, with increased associated T2 hyperintense signal. 2. Unchanged size of the enhancing mass in the left precentral gyrus. 3. Decreased size of the enhancing lesion in the right occipital lobe, with decreased surrounding T2 hyperintense signal. 4. Diminishing enhancement in a previously noted left cerebellar lesion. 5. No new abnormal enhancement. Electronically Signed   By: Donald Campion M.D.   On: 08/05/2023 14:50    Microbiology: Results for orders placed or performed during the hospital encounter of 10/24/22  Toxoplasma Gondii, PCR     Status: None   Collection Time: 10/24/22 11:55 AM   Specimen: Blood  Result Value Ref Range Status   Toxoplasma Gondii, PCR Negative Negative Final    Comment: (NOTE) No Toxoplasma gondii  DNA detected. This test was developed and its performance characteristics determined by [employer]. It has not been cleared or approved by the U.S. Food and Drug Administration. The FDA has determined that such clearance or approval is not necessary. This test is used for clinical purposes. It should not be regarded as investigational or research. Performed At: [facility] [address] [place], [place] [id number] Jennette Shorter MD Ey:[id number]    *Note: Due to a large number of results and/or encounters for the requested time period, some results have not been displayed. A complete set of results can be found in Results Review.    Labs: CBC: Recent Labs  Lab 08/20/23 2255 08/21/23 1736 08/23/23 0515 08/26/23 0500  WBC 6.9 10.0 5.8 6.9  NEUTROABS 5.7  --  4.7  --   HGB 13.5 13.0 11.9* 12.5*  HCT 42.6 39.9 37.0* 38.1*  MCV 85.7 83.6 84.7 83.9  PLT 225 198 184 222   Basic Metabolic Panel: Recent Labs  Lab 08/20/23 2255 08/21/23 1736 08/22/23 0648 08/23/23 0515 08/26/23 0500  NA 143 142 146* 141 140  K 4.4 3.9 4.1 3.8 4.2  CL 103 108 109 106 105  CO2 24 23 25 26 28   GLUCOSE 136* 121* 111* 151* 103*  BUN 18 18 20 22 22   CREATININE 1.16 0.97 1.08 0.93 0.84  CALCIUM  9.4 9.0 9.2 8.5* 8.7*  MG  --  2.2  --   --   --    Liver Function Tests: Recent Labs  Lab 08/20/23 2255  AST 28  ALT 26  ALKPHOS 68  BILITOT 0.6  PROT 7.6  ALBUMIN  4.0   CBG: Recent Labs  Lab 08/20/23 2253  GLUCAP 143*    Discharge time spent: greater than 30 minutes.  Signed: Lebron JINNY Cage, MD [facility] 08/26/2023

## 2023-08-26 NOTE — Progress Notes (Signed)
 Inpatient Rehabilitation Admissions Coordinator   Noted [facility] now recommended. I will notify Health team advantage. We will sign off.  Jeannetta Millman, RN, MSN Rehab Admissions Coordinator [phone] 08/26/2023 1:52 PM

## 2023-08-26 NOTE — TOC Transition Note (Signed)
 Transition of Care [facility]) - Discharge Note   Patient Details  Name: Joshua [name] MRN: [id number] Date of Birth: 04-08-1956  Transition [facility]) CM/SW Contact:  Andrez JULIANNA George, RN Phone Number: 08/26/2023, 1:52 PM   Clinical Narrative:     Pt is doing better and can be discharged home with home health services. Home health arranged with . They will contact him for the first home visit. Information on the AVS.  Pt has needed DME at home. Pt states his aunt Rock and a friend: [name] will check on him.  Rock will provide transportation home.   Final next level of care: Home w Home Health Services Barriers to Discharge: No Barriers Identified   Patient Goals and CMS Choice   CMS Medicare.gov Compare Post Acute Care list provided to:: Patient Choice offered to / list presented to : Patient      Discharge Placement                       Discharge Plan and Services Additional resources added to the After Visit Summary for                            [facility] Arranged: PT, OT, Speech Therapy          Social Drivers of Health (SDOH) Interventions SDOH Screenings   Food Insecurity: No Food Insecurity (08/21/2023)  Housing: Low Risk  (08/21/2023)  Recent Concern: Housing - High Risk (05/29/2023)  Transportation Needs: No Transportation Needs (08/21/2023)  Utilities: Not At Risk (08/21/2023)  Alcohol  Screen: Low Risk  (04/16/2020)  Depression (PHQ2-9): Low Risk  (06/02/2023)  Financial Resource Strain: Low Risk  (07/27/2023)   Received from [facility]  Physical Activity: Insufficiently Active (05/29/2023)  Social Connections: Socially Isolated (08/21/2023)  Stress: No Stress Concern Present (05/29/2023)  Tobacco Use: Medium Risk (08/21/2023)     Readmission Risk Interventions    10/09/2021    3:42 PM  Readmission Risk Prevention Plan  Transportation Screening Complete  PCP or Specialist Appt within 5-7 Days Complete  Home Care Screening  Complete  Medication Review (RN CM) Complete

## 2023-08-26 NOTE — Progress Notes (Signed)
 PROGRESS NOTE  Joshua Joshua [name] FMW:[id number] DOB: [birth year] DOA: 08/21/2023 PCP: Emilio Kelly DASEN, FNP (Inactive)  HPI/Recap of past 24 hours: Joshua Joshua [name] is a 68 y.o. 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 At baseline patient has right-sided weakness and slurred speech which patient noticed has been worsening for the past week.  Patient discussed with [name] who prescribed p.o. Decadron , despite taking it, had no improvement, which may have led to his fall. Patient had been noticed to have jerking movements on his right side. In the ED at [facility] patient was seen as a code stroke. CT scan of the head noted increasing left frontal parietal edema and mass effect with 9 mm right midline shift likely relating to worsening metastatic disease.  Twitching improved after patient was given Keppra , Ativan , Vimpat , and Decadron .  Neurology consulted, recommended continuous EEG monitoring.  Patient admitted for further management.    Today, patient denies any new complaints.  Pending discharge to CIR, noted pending P2P by 3 PM today   Assessment/Plan: Principal Problem:   Seizure (HCC) Active Problems:   Cerebral edema (HCC)   Neoplasm causing mass effect and brain compression on adjacent structures (HCC)   Squamous cell carcinoma of right lung (HCC)   Malignant neoplasm metastatic to brain [facility])   PVD (peripheral vascular disease) (HCC)   Hypothyroidism due to medication   Hyperlipidemia, unspecified   ?Seizure Fall Cerebral edema with mass effect No further episodes of jerking movements noted CT of the brain revealed increasing left frontal parietal edema and mass effect with 9 mm right midline shift likely relating to worsening metastatic disease  MRI brain w/wo  contrast showed stable to minimally increased size of 2 left cerebral metastases since 08/05/2023. Worsening, extensive vasogenic edema with 7 mm of rightward midline shift.  No new metastasis Continuous EEG monitoring suggestive of cortical dysfunction arising from left temporal region, no seizure or epileptiform discharges were seen Neurology consulted, appreciate recs Oncologist [name], recommended 4 mg of dexamethasone  daily upon discharge Neurochecks, seizure precautions Switch to p.o. Decadron , continue Keppra  PT/OT/speech- recommend CIR   Malignant neoplasm metastatic to brain Squamous cell carcinoma of the right lung Patient is currently being followed by [name] previously treated with chemotherapy and radiation treatments Patient also followed by Dr. Tamea of pulmonology [name] of medical oncology added to treatment team and informed  Hypernatremia Improved S/p IV fluid   Peripheral vascular disease Patient has a history of claudication status post prior bypass grafting and followed by Dr. Jama of vascular surgery. Continue aspirin , Plavix , and statin   COPD Stable Albuterol  nebs as needed for shortness of breath/wheezing   Hyperlipidemia Continue Crestor    Hypothyroidism TSH WNL Continue levothyroxine       Estimated body mass index is 29.83 kg/m as calculated from the following:   Height as of this encounter: 5' 11 (1.803 m).   Weight as of this encounter: 97 kg.     Code Status: Full  Family Communication: None at bedside  Disposition Plan: Status is: Inpatient Remains inpatient appropriate because: Level of care      Consultants: Neurology  Procedures: Continuous EEG  Antimicrobials: None  DVT prophylaxis: Eliquis    Objective: Vitals:   08/25/23 1535 08/25/23 2029 08/26/23 0600 08/26/23 0855  BP: (!) 153/79 130/72 128/74 (!) 140/77  Pulse: 75 73 61 67  Resp:  18 18   Temp: 97.9 F (36.6 C) 98 F (36.7 C) 98.2 F  (36.8 C) 97.7 F (36.5 C)  TempSrc: Oral Oral Oral Oral  SpO2: 100% 99% 98% 99%  Weight:      Height:        Intake/Output Summary (Last 24 hours) at 08/26/2023 1015 Last data filed at 08/26/2023 0900 Gross per 24 hour  Intake 240 ml  Output 2450 ml  Net -2210 ml   Filed Weights   08/21/23 1024 08/21/23 1831  Weight: 99.8 kg 97 kg    Exam: General: NAD, noted aphasia Cardiovascular: S1, S2 present Respiratory: CTAB Abdomen: Soft, nontender, nondistended, bowel sounds present Musculoskeletal: No bilateral pedal edema noted Skin: Normal Psychiatry: Normal mood      Data Reviewed: CBC: Recent Labs  Lab 08/20/23 2255 08/21/23 1736 08/23/23 0515 08/26/23 0500  WBC 6.9 10.0 5.8 6.9  NEUTROABS 5.7  --  4.7  --   HGB 13.5 13.0 11.9* 12.5*  HCT 42.6 39.9 37.0* 38.1*  MCV 85.7 83.6 84.7 83.9  PLT 225 198 184 222   Basic Metabolic Panel: Recent Labs  Lab 08/20/23 2255 08/21/23 1736 08/22/23 0648 08/23/23 0515 08/26/23 0500  NA 143 142 146* 141 140  K 4.4 3.9 4.1 3.8 4.2  CL 103 108 109 106 105  CO2 24 23 25 26 28   GLUCOSE 136* 121* 111* 151* 103*  BUN 18 18 20 22 22   CREATININE 1.16 0.97 1.08 0.93 0.84  CALCIUM  9.4 9.0 9.2 8.5* 8.7*  MG  --  2.2  --   --   --    GFR: Estimated Creatinine Clearance: 104.2 mL/min (by C-G formula based on SCr of 0.84 mg/dL). Liver Function Tests: Recent Labs  Lab 08/20/23 2255  AST 28  ALT 26  ALKPHOS 68  BILITOT 0.6  PROT 7.6  ALBUMIN  4.0   No results for input(s): LIPASE, AMYLASE in the last 168 hours. No results for input(s): AMMONIA in the last 168 hours. Coagulation Profile: Recent Labs  Lab 08/20/23 2255  INR 1.0   Cardiac Enzymes: No results for input(s): CKTOTAL, CKMB, CKMBINDEX, TROPONINI in the last 168 hours. BNP (last 3 results) No results for input(s): PROBNP in the last 8760 hours. HbA1C: No results for input(s): HGBA1C in the last 72 hours. CBG: Recent Labs  Lab  08/20/23 2253  GLUCAP 143*   Lipid Profile: No results for input(s): CHOL, HDL, LDLCALC, TRIG, CHOLHDL, LDLDIRECT in the last 72 hours. Thyroid  Function Tests: No results for input(s): TSH, T4TOTAL, FREET4, T3FREE, THYROIDAB in the last 72 hours.  Anemia Panel: No results for input(s): VITAMINB12, FOLATE, FERRITIN, TIBC, IRON, RETICCTPCT in the last 72 hours. Urine analysis:    Component Value Date/Time   COLORURINE YELLOW (A) 11/21/2020 1150   APPEARANCEUR HAZY (A) 11/21/2020 1150   LABSPEC 1.014 11/21/2020 1150   PHURINE 5.0 11/21/2020 1150   GLUCOSEU NEGATIVE 11/21/2020 1150   HGBUR NEGATIVE 11/21/2020 1150   BILIRUBINUR NEGATIVE 11/21/2020 1150   KETONESUR NEGATIVE 11/21/2020 1150   PROTEINUR NEGATIVE 11/21/2020 1150   UROBILINOGEN 0.2 08/02/2009 1129   NITRITE NEGATIVE 11/21/2020 1150   LEUKOCYTESUR NEGATIVE 11/21/2020 1150   Sepsis Labs: @LABRCNTIP (procalcitonin:4,lacticidven:4)  )No results found for this or any previous visit (from the past 240 hours).    Studies: No results found.  Scheduled Meds:  aspirin  EC  81 mg Oral Daily   Chlorhexidine  Gluconate Cloth  6 each Topical Daily  clopidogrel   75 mg Oral Daily   dexamethasone   4 mg Oral Daily   enoxaparin  (LOVENOX ) injection  40 mg Subcutaneous Q24H   levETIRAcetam   1,000 mg Oral BID   levothyroxine   125 mcg Oral QAC breakfast   rosuvastatin   40 mg Oral Daily   sodium chloride  flush  10-40 mL Intracatheter Q12H    Continuous Infusions:     LOS: 5 days     Lebron JINNY Cage, MD [facility]  If 7PM-7AM, please contact night-coverage [email or web address] 08/26/2023, 10:15 AM

## 2023-08-26 NOTE — Progress Notes (Signed)
 Physical Therapy Treatment Patient Details Name: Joshua [name] MRN: [id number] DOB: 1955/09/23 Today's Date: 08/26/2023   History of Present Illness 68 yo male presents to [facility] ED 1/31 after a fall and seizure like activity. Transferred to [facility] for stroke work up. 1/28 pt reported increased R-sided weakness and slurred speech.MRI -  minimally increased size of 2 left cerebral metastases since 08/05/2023. Worsening, extensive vasogenic edema with 7 mm of rightward midline shift.  EEG suggestive of cortical dysfunction arising from left temporal region likely secondary to structural abnormality/ tumor. EFY:vljfnld cell carcinoma of the lung with brain metastasis (s/p chemo, radiation, and Imfinzi  immunotherapy), hypertension, PVD, COPD, hypothyroidism, and prior tobacco abuse.    PT Comments  Pt continues to make progress regaining function and attention on R side. Pt with good awareness of deficits with ambulation and is able to correct BoS and stance phase of gait. Pt has all necessary equipment at home and reports that his aunt can help him with errands and iADLs. PT recommending HHPT. Pt hopeful for discharge home today.     If plan is discharge home, recommend the following: Assistance with cooking/housework;Assist for transportation;Help with stairs or ramp for entrance;A little help with walking and/or transfers;A little help with bathing/dressing/bathroom   Can travel by private vehicle        Equipment Recommendations  Other (comment) (defer to next venue)    Recommendations for Other Services       Precautions / Restrictions Precautions Precautions: Fall Precaution Comments: R inattention Restrictions Weight Bearing Restrictions Per Provider Order: No     Mobility  Bed Mobility               General bed mobility comments: OOB in chair    Transfers Overall transfer level: Needs assistance Equipment used: Rolling walker (2 wheels) Transfers: Sit to/from  Stand Sit to Stand: Contact guard assist           General transfer comment: good power up and self steady, good use of armrests for power up and for sitting back down    Ambulation/Gait Ambulation/Gait assistance: Contact guard assist Gait Distance (Feet): 300 Feet Assistive device: Rolling walker (2 wheels), None Gait Pattern/deviations: Decreased step length - right, Decreased stride length, Decreased dorsiflexion - right, Decreased step length - left Gait velocity: decreased Gait velocity interpretation: 1.31 - 2.62 ft/sec, indicative of limited community ambulator   General Gait Details: pt able to repeat to therapist what he is supposed to be working on with his gait, improved BoS and dorsiflexion with gait, self reports he needs to be standing up straighter and is able to self correct         Balance Overall balance assessment: History of Falls, Needs assistance Sitting-balance support: Feet supported Sitting balance-Leahy Scale: Fair     Standing balance support: During functional activity, No upper extremity supported Standing balance-Leahy Scale: Poor Standing balance comment: reliant on at least single UE support                            Cognition Arousal: Alert Behavior During Therapy: WFL for tasks assessed/performed Overall Cognitive Status: Impaired/Different from baseline Area of Impairment: Attention, Following commands, Awareness, Safety/judgement                   Current Attention Level: Selective Memory: Decreased short-term memory Following Commands: Follows one step commands consistently Safety/Judgement: Decreased awareness of safety, Decreased awareness of deficits Awareness: Emergent  General Comments: likely close to his baseline, needs occasional redirection           General Comments General comments (skin integrity, edema, etc.): VSS on RA      Pertinent Vitals/Pain Pain Assessment Pain Assessment: No/denies  pain     PT Goals (current goals can now be found in the care plan section) Acute Rehab PT Goals PT Goal Formulation: With patient Time For Goal Achievement: 09/05/23 Progress towards PT goals: Progressing toward goals    Frequency    Min 1X/week       AM-PAC PT 6 Clicks Mobility   Outcome Measure  Help needed turning from your back to your side while in a flat bed without using bedrails?: None Help needed moving from lying on your back to sitting on the side of a flat bed without using bedrails?: A Little Help needed moving to and from a bed to a chair (including a wheelchair)?: A Little Help needed standing up from a chair using your arms (e.g., wheelchair or bedside chair)?: A Little Help needed to walk in hospital room?: A Little Help needed climbing 3-5 steps with a railing? : A Lot 6 Click Score: 18    End of Session Equipment Utilized During Treatment: Gait belt Activity Tolerance: Patient tolerated treatment well Patient left: in chair;with chair alarm set;with call bell/phone within reach Nurse Communication: Mobility status PT Visit Diagnosis: Unsteadiness on feet (R26.81);Repeated falls (R29.6);Muscle weakness (generalized) (M62.81);Difficulty in walking, not elsewhere classified (R26.2);Other symptoms and signs involving the nervous system (R29.898)     Time: 8748-8679 PT Time Calculation (min) (ACUTE ONLY): 29 min  Charges:    $Gait Training: 8-22 mins $Neuromuscular Re-education: 8-22 mins PT General Charges $$ ACUTE PT VISIT: 1 Visit                     Rilyn Scroggs B. Fleeta Lapidus PT, DPT Acute Rehabilitation Services Please use secure chat or  Call Office [phone]    Almarie KATHEE Fleeta Fleet 08/26/2023, 1:42 PM

## 2023-08-26 NOTE — Progress Notes (Signed)
 Inpatient Rehabilitation Admissions Coordinator   I have asked Dr Alessandra Ancona to complete peer to peer with Dr Luster Salters of [employer] by 3 pm today.  Jeannetta Millman, RN, MSN Rehab Admissions Coordinator [phone] 08/26/2023 9:49 AM

## 2023-08-27 ENCOUNTER — Inpatient Hospital Stay: Payer: HMO | Admitting: Oncology

## 2023-08-27 ENCOUNTER — Inpatient Hospital Stay: Payer: HMO

## 2023-08-28 ENCOUNTER — Other Ambulatory Visit (HOSPITAL_COMMUNITY): Payer: Self-pay

## 2023-09-03 ENCOUNTER — Other Ambulatory Visit: Payer: Self-pay

## 2023-09-03 ENCOUNTER — Telehealth: Payer: Self-pay | Admitting: *Deleted

## 2023-09-03 MED ORDER — CLOPIDOGREL BISULFATE 75 MG PO TABS
75.0000 mg | ORAL_TABLET | Freq: Every day | ORAL | 2 refills | Status: DC
  Filled 2023-09-03: qty 100, 100d supply, fill #0

## 2023-09-03 MED ORDER — LEVOTHYROXINE SODIUM 125 MCG PO TABS
125.0000 ug | ORAL_TABLET | Freq: Every day | ORAL | 0 refills | Status: DC
  Filled 2023-09-03: qty 90, 90d supply, fill #0

## 2023-09-03 NOTE — Telephone Encounter (Signed)
Rx sent to [facility] pharmacy.   Looks like pt is out of hospital. Please advise on follow up

## 2023-09-03 NOTE — Telephone Encounter (Signed)
I see that the medication levothyroxine 125 mcg on 08/05/2023 but it went to [facility]. The patient called and wanted a refill and send it to the [facility] pharmacy

## 2023-09-04 ENCOUNTER — Encounter: Payer: Self-pay | Admitting: Internal Medicine

## 2023-09-04 ENCOUNTER — Inpatient Hospital Stay: Payer: HMO | Attending: Oncology | Admitting: Internal Medicine

## 2023-09-04 ENCOUNTER — Other Ambulatory Visit: Payer: Self-pay | Admitting: Oncology

## 2023-09-04 ENCOUNTER — Other Ambulatory Visit: Payer: Self-pay

## 2023-09-04 VITALS — BP 148/75 | HR 71 | Temp 96.8°F | Resp 20 | Wt 212.5 lb

## 2023-09-04 DIAGNOSIS — R569 Unspecified convulsions: Secondary | ICD-10-CM

## 2023-09-04 DIAGNOSIS — C3491 Malignant neoplasm of unspecified part of right bronchus or lung: Secondary | ICD-10-CM

## 2023-09-04 DIAGNOSIS — C3411 Malignant neoplasm of upper lobe, right bronchus or lung: Secondary | ICD-10-CM | POA: Diagnosis not present

## 2023-09-04 DIAGNOSIS — Z5112 Encounter for antineoplastic immunotherapy: Secondary | ICD-10-CM | POA: Diagnosis not present

## 2023-09-04 DIAGNOSIS — Z79620 Long term (current) use of immunosuppressive biologic: Secondary | ICD-10-CM | POA: Diagnosis not present

## 2023-09-04 DIAGNOSIS — C7931 Secondary malignant neoplasm of brain: Secondary | ICD-10-CM

## 2023-09-04 MED ORDER — LAMOTRIGINE 100 MG PO TABS
100.0000 mg | ORAL_TABLET | Freq: Every day | ORAL | 1 refills | Status: DC
  Filled 2023-09-04: qty 60, 60d supply, fill #0

## 2023-09-04 MED ORDER — LAMOTRIGINE 25 MG PO TABS
ORAL_TABLET | ORAL | 0 refills | Status: DC
  Filled 2023-09-04: qty 21, 14d supply, fill #0

## 2023-09-04 MED ORDER — DEXAMETHASONE 1 MG PO TABS
ORAL_TABLET | ORAL | 0 refills | Status: AC
  Filled 2023-09-04: qty 42, 21d supply, fill #0

## 2023-09-04 NOTE — Progress Notes (Signed)
[facility] [address]  [place], [place] [address] [phone]   Interval Evaluation  Date of Service: 09/04/23 Patient Name: Joshua [name] Patient MRN: [id number] Patient DOB: [birth year] Provider: Henreitta Leber, MD  Identifying Statement:  Joshua [name] is a 68 y.o. male with Malignant neoplasm metastatic to brain [facility])  Seizure [facility])    Primary Cancer:  Oncologic History: Oncology History  Squamous cell carcinoma of right lung (HCC)  09/18/2020 Imaging   PET scan showed right upper lobe nodule maximal SUV 11.5.  Hypermetabolic cluster right paratracheal adenopathy with maximum SUV up to 12.  No findings of metastatic disease to the neck/abdomen/pelvis or skeleton.   09/20/2020 Initial Diagnosis   Squamous cell carcinoma of right lung (HCC) NGS showed PD-L1 TPS 95%, LRP1B S926fs, PIK3CA E545K, RB1 c1422-2A>T, RIT1 T83_ A84del, STK11 P240fs, TPS53 R248W, TMB 19.38mut/mb, MS stable.    09/20/2020 Cancer Staging   Staging form: Lung, AJCC 8th Edition - Clinical stage from 09/20/2020: Stage IV (cT1b, cN2, cM1) - Signed by [name], MD on 12/13/2021   10/03/2020 Imaging   MRI brain with and without contrast showed no evidence of intracranial metastatic disease. Well-defined round lesion within the left parotid tail measuring approximately 2.3 x 1.8 x 1.8 cm   10/09/2020 - 11/27/2020 Radiation Therapy   concurrent chemoradiation   10/10/2020 - 10/31/2020 Chemotherapy   10/10/2020 concurrent chemoradiation.  carboplatin AUC of 2 and Taxol 45 mg/m2 x 4.  Additional chemotherapy was held due to thrombocytopenia, neutropenia       12/31/2020 - 03/21/2022 Chemotherapy   Maintenance  Durvalumab q14d      12/31/2020 -  Chemotherapy   Patient is on Treatment Plan : LUNG Durvalumab (10) q14d     03/28/2021 Imaging   CT without contrast showed new linear right perihilar consolidation with associated traction bronchiectasis.  Likely  postradiation changes.  Additional new peribronchial vascular/nodular groundglass opacities with areas of consolidation or seen primarily in the right upper and lower lobes, more distant from expected radiation fields. 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  Per my discussion with pulmonology Dr. 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     05/09/2021 Imaging   Repeat CT chest without contrast showed evolving radiation changes in the right perihilar region likely accounting for interval increase to thickening of the posterior wall of the right upper lobe bronchus.  The more peripheral right lung groundglass opacities seen on most recent study have generally improved with exception of one peripherally right upper lobe which appears new.  These are likely inflammatory.  No findings highly suspicious for local recurrence.   05/30/2021 - 01/10/2022 Chemotherapy   Durvalumab q14d     10/07/2021 Imaging   MRI brain with and without contrast showed 2 contrast-enhancing lesions, most consistent with metastatic disease.  Largest lesion is in the left frontal lobe and measures up to 1.3 cm with a large amount of surrounding edema and 9 mm rightward midline shift.  The other enhancing lesion was 4 mm in the right occipital lobe with a small amount of surrounding edema.  No acute or chronic hemorrhage.   10/07/2021 Progression   Headache after colonoscopy, progressive difficulty using her right arm.  Progressively worsening of speech. Stage IV lung cancer with brain metastasis.  started on Dexamethasone.  He was seen by neurosurgery and radiation oncology.  Recommendation is to proceed with SRS.  Patient was discharged with dexamethasone   10/16/2021 Imaging   PET scan showed postradiation changes in the right supra hilar lung without evidence of local recurrence.  No evidence of  lung cancer recurrence or metastatic disease.  Single focus of intense metabolic activity associated with posterior left 11th rib likely secondary to recent fall/posttraumatic metabolic changes.  Frontal  uptake in the left frontal lobe corresponding to the known metastatic lesion in the brain   10/29/2021 -  Radiation Therapy   Brain Radiation [facility]   12/10/2021 Imaging   Brain MRI: Continued interval decrease in size of a left frontal lobe metastasis, now measuring 10 mm. Moderate surrounding edema has also decreased.  3-4 mm metastasis within the right occipital lobe, not significantly changed in size. There is now a small focus of central non enhancement within this lesion. Increased edema. 3 mm enhancing metastasis within the high left parietal lobe. In retrospect, this was likely present as a subtle punctate focus of enhancement on the prior brain MRI of 10/22/2021.   01/18/2022 Imaging   CT chest abdomen pelvis w contrast 1. Stable post treatment changes in the right upper lobe medially with dense radiation fibrosis but no findings to suggest local recurrent tumor. 2. Significant progression of ill-defined ground-glass opacity, interstitial thickening and airspace nodularity in the right upper lobe and upper aspect of the right lower lobe. Findings could reflect an inflammatory or atypical infectious process, drug induced pneumonitis or interstitial spread of tumor.3. No mediastinal or hilar mass or adenopathy. 4. No findings for abdominal/pelvic metastatic disease or osseous metastatic disease.   02/19/2022 Imaging   MRI brain w wo contrast showed Three metastatic deposits in the brain appear smaller. Decreased edema in the left parietal lobe. No new lesions    04/04/2022 Imaging   CT chest w contrast 1. Stable radiation changes in the right perihilar region without evidence of local recurrence. 2. Interval improvement in the patchy ground-glass opacities previously demonstrated in both lungs,  most consistent with a resolving infectious or inflammatory process (including immunotherapy-related pneumonitis). 3. No evidence of metastatic disease.   05/14/2022 Miscellaneous   He had a mechanical fall and fractured his left elbow. xrays showed a left elbow fracture  05/26/22 seen by orthopedic surgeon, cast applied.    05/27/2022 Imaging   MRI brain w wo contrast 1. All three known brain metastases have enlarged since August, with increasing regional edema. And there is a small new left anterior cerebellar 4-5 mm metastasis (was questionably punctate on the May MRI). This constellation could reflect a combination of True Progression (Left Cerebellar lesion) and pseudoprogression/radionecrosis (3 existing mets).    2. No significant intracranial mass effect. 3. Underlying moderate cerebral white matter signal changes.   05/30/2022 Imaging   CT chest abdomen pelvis w contrast  1. 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 There is a stable small right pleural effusion, but no definitive findings to suggest residual or recurrent disease on [name]'s examination. 2. No signs of metastatic disease in the abdomen or pelvis.  3. Aortic atherosclerosis, in addition to two-vessel coronary artery disease. Please note that although the presence of coronary artery calcium documents the presence of coronary artery disease, the severity of this disease and any potential stenosis cannot be assessed on this non-gated CT examination. Assessment for potential risk factor modification, dietary therapy or pharmacologic therapy may be warranted, if  clinically indicated. 4. Hepatic steatosis. 5. Colonic diverticulosis without evidence of acute diverticulitis at this time. 6. Incompletely imaged but apparently occluded vascular graft in the superior aspect of the right anterior thigh. Referral  to vascular specialist for further clinical evaluation should be considered if clinically appropriate. 7. Additional incidental findings, as above.   07/22/2022 Imaging   MRI brain  1. No new or enlarging lesions. No intracranial disease progression. 2. Decreased size of treated lesions of the left frontoparietal junction and left postcentral gyrus. 3. Unchanged lesions in the left cerebellum and right occipital lobe.   10/15/2022 Imaging   MRI brain w wo contrast showed Interval increase in size of multiple intraparenchymal lesions with worsened surrounding vasogenic edema. 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 No new lesions   11/05/2022 Imaging   MRI brain w wo contrast  1. Likely slight increase and right occipital and left cerebellar lesions. Otherwise, unchanged metastases, as above. No new lesions identified. 2. Partially imaged large probable lymph node in the left upper neck, apparent on prior PET CT.   11/13/2022 -  Radiation Therapy   Each of the sites were treated with SRS/SBRT/SRT-IMRT technique to 20 Gy in 1 fraction to each of the following sites: PTV_2_LCerebel_30mm PTV_3_RCerebel_31mm PTV_4_ROccip_2mm PTV_5_LParietal_9mm   11/27/2022 Imaging   CT chest abdomen pelvis w contrast showed  Improving bilateral scattered interstitial and ground-glass opacities. Small amount of residual areas in the extreme left lower lobe inferiorly. This specific area is slightly increased from previous but overall the amount of opacities is significantly improved. Recommend continued follow-up. No developing new mass lesion, fluid collection or lymph node enlargement. Stable posttreatment changes right perihilar in the lung. Fatty liver infiltration. Colonic diverticulosis. Improved tiny right pleural effusion   02/05/2023 Imaging   MRI brain with and without contrast Interval decrease in all of the metastatic  lesions, detailed above.No new lesions identified.    Imaging   CT chest abdomen pelvis w contrast showed  1. 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 2. Trace right pleural fluid or thickening, new or minimally increased. 3. Incidental findings, including: Aortic atherosclerosis (ICD10-I70.0), coronary artery atherosclerosis and emphysema (ICD10-J43.9). Hepatic steatosis.   06/25/2023 Imaging   CT chest abdomen pelvis w contrast showed 1. Unchanged post treatment/post radiation appearance of the right lung, with perihilar and suprahilar paramedian fibrosis and consolidation. 2. No evidence of lymphadenopathy or metastatic disease in the chest, abdomen, or pelvis. 3. Hepatic steatosis. 4. Coronary artery disease.   Aortic Atherosclerosis (ICD10-I70.0) and Emphysema (ICD10-J43.9).    CNS Oncologic History 10/29/21: SRS to left frontal metastasis ([place]) 11/13/22: salvage SRS x5 with Dr. Mitzi Hansen  Interval History: Joshua [name] for follow up after recent hospitalization for seizures.  He experienced sudden onset right arm and face twitching, resolved once seizure meds and steroids were started inpatient.  He is now back to prior baseline, dosing Keppra 1000mg  BID and Decadron 4mg  daily.  He complains of mood swings and irritability.  Continues to ambulate with a cane as prior.  Remains on Imfinzi immunotherapy with Dr. Cathie Hoops.  H+P (06/20/22) Patient presents [name] for evaluation for neurologic complaints.  He describes gradual onset of impaired speech output and right arm greater than right leg weakness.  This led to a fall and an injury to his left arm.  He was started on decadron two weeks ago, this has led to considerable improvement  in symptoms.  [name] he feels at his prior baseline.  No issues tolerating the steroids.  He is due for durvalumab infusion [name] with Dr. Cathie Hoops.   Medications: Current  Outpatient Medications on File Prior to Visit  Medication Sig Dispense Refill   aspirin EC 81 MG tablet Take 81 mg by mouth daily. Swallow whole.     clopidogrel (PLAVIX) 75 MG tablet Take 1 tablet (75 mg total) by mouth daily. 100 tablet 2   dexamethasone (DECADRON) 4 MG tablet Take 1 tablet (4 mg total) by mouth daily. 30 tablet 0   levETIRAcetam (KEPPRA) 1000 MG tablet Take 1 tablet (1,000 mg total) by mouth 2 (two) times daily. 60 tablet 0   levothyroxine (LEVOXYL) 125 MCG tablet Take 1 tablet (125 mcg total) by mouth daily before breakfast. 90 tablet 0   rosuvastatin (CRESTOR) 40 MG tablet Take 1 tablet (40 mg total) by mouth daily. 90 tablet 3   Vitamin D, Ergocalciferol, (DRISDOL) 1.25 MG (50000 UNIT) CAPS capsule Take 1 capsule (50,000 Units total) by mouth every 7 (seven) days. (Patient taking differently: Take 50,000 Units by mouth every 7 (seven) days. On Sundays) 13 capsule 3   clopidogrel (PLAVIX) 75 MG tablet TAKE 1 TABLET BY MOUTH DAILY 100 tablet 2   Current Facility-Administered Medications on File Prior to Visit  Medication Dose Route Frequency Provider Last Rate Last Admin   albuterol (PROVENTIL) (2.5 MG/3ML) 0.083% nebulizer solution 2.5 mg  2.5 mg Nebulization Once Salena Saner, MD       heparin lock flush 100 unit/mL  500 Units Intracatheter Once PRN [name], MD        Allergies: No Known Allergies Past Medical History:  Past Medical History:  Diagnosis Date   Allergy    Cancer (HCC)    lung   COPD (chronic obstructive pulmonary disease) (HCC)    Dyspnea    former smoker. 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   Malignant neoplasm metastatic to brain [facility])    Peripheral vascular disease (HCC)    Pre-diabetes    Sleep apnea    Past Surgical History:  Past Surgical History:  Procedure Laterality Date   ANTERIOR CERVICAL DECOMP/DISCECTOMY FUSION N/A 03/14/2020   Procedure: ANTERIOR CERVICAL  DECOMPRESSION/DISCECTOMY FUSION 3 LEVELS C4-7;  Surgeon: Venetia Night, MD;  Location: [facility] ORS;  Service: Neurosurgery;  Laterality: N/A;   BACK SURGERY  2011   CENTRAL LINE INSERTION Right 09/10/2016   Procedure: CENTRAL LINE INSERTION;  Surgeon: Renford Dills, MD;  Location: [facility] ORS;  Service: Vascular;  Laterality: Right;   CERVICAL FUSION     C4-c7 on Mar 14, 2020   COLONOSCOPY  2013 ?   COLONOSCOPY N/A 09/09/2021   Procedure: COLONOSCOPY;  Surgeon: Jaynie Collins, DO;  Location: [facility] ENDOSCOPY;  Service: Gastroenterology;  Laterality: N/A;   CORONARY ANGIOPLASTY     ESOPHAGOGASTRODUODENOSCOPY N/A 09/09/2021   Procedure: ESOPHAGOGASTRODUODENOSCOPY (EGD);  Surgeon: Jaynie Collins, DO;  Location: [facility] ENDOSCOPY;  Service: Gastroenterology;  Laterality: N/A;   FEMORAL-POPLITEAL BYPASS GRAFT Right 09/10/2016   Procedure: BYPASS GRAFT FEMORAL-POPLITEAL ARTERY ( BELOW KNEE );  Surgeon: Renford Dills, MD;  Location: [facility] ORS;  Service: Vascular;  Laterality: Right;   FEMORAL-TIBIAL BYPASS GRAFT Right 03/23/2020   Procedure: BYPASS GRAFT RIGHT FEMORAL- DISTAL TIBIAL ARTERY WITH DISTAL FLOW GRAFT;  Surgeon: Renford Dills, MD;  Location: [facility] ORS;  Service: Vascular;  Laterality: Right;   LEFT HEART CATH AND CORONARY ANGIOGRAPHY Left 05/16/2021   Procedure: LEFT HEART CATH AND CORONARY ANGIOGRAPHY;  Surgeon: Armando Reichert, MD;  Location: [facility] INVASIVE CV LAB;  Service: Cardiovascular;  Laterality: Left;   LOWER EXTREMITY ANGIOGRAPHY Right 11/18/2016   Procedure: Lower Extremity Angiography;  Surgeon: Renford Dills, MD;  Location: [facility] INVASIVE CV LAB;  Service: Cardiovascular;  Laterality: Right;   LOWER EXTREMITY ANGIOGRAPHY Right 12/09/2016   Procedure: Lower Extremity Angiography;  Surgeon: Renford Dills, MD;  Location: [facility] INVASIVE CV LAB;  Service: Cardiovascular;  Laterality: Right;   LOWER EXTREMITY ANGIOGRAPHY Right 11/15/2019   Procedure: LOWER  EXTREMITY ANGIOGRAPHY;  Surgeon: Renford Dills, MD;  Location: [facility] INVASIVE CV LAB;  Service: Cardiovascular;  Laterality: Right;   LOWER EXTREMITY ANGIOGRAPHY Right 12/01/2019   Procedure: Lower Extremity Angiography;  Surgeon: Annice Needy, MD;  Location: [facility] INVASIVE CV LAB;  Service: Cardiovascular;  Laterality: Right;   LOWER EXTREMITY ANGIOGRAPHY Right 12/02/2019   Procedure: LOWER EXTREMITY ANGIOGRAPHY;  Surgeon: Renford Dills, MD;  Location: [facility] INVASIVE CV LAB;  Service: Cardiovascular;  Laterality: Right;   LOWER EXTREMITY ANGIOGRAPHY Right 03/23/2020   Procedure: Lower Extremity Angiography;  Surgeon: Renford Dills, MD;  Location: [facility] INVASIVE CV LAB;  Service: Cardiovascular;  Laterality: Right;   LOWER EXTREMITY ANGIOGRAPHY Right 09/25/2021   Procedure: Lower Extremity Angiography;  Surgeon: Renford Dills, MD;  Location: [facility] INVASIVE CV LAB;  Service: Cardiovascular;  Laterality: Right;   LOWER EXTREMITY ANGIOGRAPHY Right 09/26/2021   Procedure: Lower Extremity Angiography;  Surgeon: Annice Needy, MD;  Location: [facility] INVASIVE CV LAB;  Service: Cardiovascular;  Laterality: Right;   LOWER EXTREMITY ANGIOGRAPHY Right 04/08/2022   Procedure: Lower Extremity Angiography;  Surgeon: Renford Dills, MD;  Location: [facility] INVASIVE CV LAB;  Service: Cardiovascular;  Laterality: Right;   LOWER EXTREMITY INTERVENTION  11/18/2016   Procedure: Lower Extremity Intervention;  Surgeon: Renford Dills, MD;  Location: [facility] INVASIVE CV LAB;  Service: Cardiovascular;;   PERIPHERAL VASCULAR CATHETERIZATION Right 01/02/2015   Procedure: Lower Extremity Angiography;  Surgeon: Renford Dills, MD;  Location: [facility] INVASIVE CV LAB;  Service: Cardiovascular;  Laterality: Right;   PERIPHERAL VASCULAR CATHETERIZATION Right 06/18/2015   Procedure: Lower Extremity Angiography;  Surgeon: Annice Needy, MD;  Location: [facility] INVASIVE CV LAB;  Service: Cardiovascular;  Laterality: Right;   PERIPHERAL  VASCULAR CATHETERIZATION  06/18/2015   Procedure: Lower Extremity Intervention;  Surgeon: Annice Needy, MD;  Location: [facility] INVASIVE CV LAB;  Service: Cardiovascular;;   PERIPHERAL VASCULAR CATHETERIZATION N/A 10/09/2015   Procedure: Abdominal Aortogram w/Lower Extremity;  Surgeon: Renford Dills, MD;  Location: [facility] INVASIVE CV LAB;  Service: Cardiovascular;  Laterality: N/A;   PERIPHERAL VASCULAR CATHETERIZATION  10/09/2015   Procedure: Lower Extremity Intervention;  Surgeon: Renford Dills, MD;  Location: [facility] INVASIVE CV LAB;  Service: Cardiovascular;;   PERIPHERAL VASCULAR CATHETERIZATION Right 10/10/2015   Procedure: Lower Extremity Angiography;  Surgeon: Annice Needy, MD;  Location: [facility] INVASIVE CV LAB;  Service: Cardiovascular;  Laterality: Right;   PERIPHERAL VASCULAR CATHETERIZATION Right 10/10/2015   Procedure: Lower Extremity Intervention;  Surgeon: Annice Needy, MD;  Location: [facility] INVASIVE CV LAB;  Service: Cardiovascular;  Laterality: Right;   PERIPHERAL VASCULAR CATHETERIZATION Right 12/03/2015   Procedure: Lower Extremity Angiography;  Surgeon: Annice Needy, MD;  Location: [facility] INV[facility] LAB;  Service: Cardiovascular;  Laterality: Right;   PERIPHERAL VASCULAR CATHETERIZATION Right 12/04/2015  Procedure: Lower Extremity Angiography;  Surgeon: Annice Needy, MD;  Location: [facility] INVASIVE [facility] LAB;  Service: Cardiovascular;  Laterality: Right;   PERIPHERAL VASCULAR CATHETERIZATION  12/04/2015   Procedure: Lower Extremity Intervention;  Surgeon: Annice Needy, MD;  Location: [facility] INVASIVE CV LAB;  Service: Cardiovascular;;   PERIPHERAL VASCULAR CATHETERIZATION Right 06/30/2016   Procedure: Lower Extremity Angiography;  Surgeon: Annice Needy, MD;  Location: [facility] INVASIVE CV LAB;  Service: Cardiovascular;  Laterality: Right;   PERIPHERAL VASCULAR CATHETERIZATION Right 06/25/2016   Procedure: Lower Extremity Angiography;  Surgeon: Renford Dills, MD;  Location: [facility] INVASIVE CV LAB;  Service:  Cardiovascular;  Laterality: Right;   PERIPHERAL VASCULAR CATHETERIZATION Right 07/01/2016   Procedure: Lower Extremity Angiography;  Surgeon: Renford Dills, MD;  Location: [facility] INVASIVE CV LAB;  Service: Cardiovascular;  Laterality: Right;   PERIPHERAL VASCULAR CATHETERIZATION  07/01/2016   Procedure: Lower Extremity Intervention;  Surgeon: Renford Dills, MD;  Location: [facility] INVASIVE CV LAB;  Service: Cardiovascular;;   PERIPHERAL VASCULAR CATHETERIZATION Right 08/01/2016   Procedure: Lower Extremity Angiography;  Surgeon: Renford Dills, MD;  Location: [facility] INVASIVE CV LAB;  Service: Cardiovascular;  Laterality: Right;   PORTA CATH INSERTION N/A 10/05/2020   Procedure: PORTA CATH INSERTION;  Surgeon: Renford Dills, MD;  Location: [facility] INVASIVE CV LAB;  Service: Cardiovascular;  Laterality: N/A;   stent placement in right leg Right    VIDEO BRONCHOSCOPY N/A 09/14/2020   Procedure: VIDEO BRONCHOSCOPY WITH FLUORO;  Surgeon: Salena Saner, MD;  Location: [facility] ORS;  Service: Cardiopulmonary;  Laterality: N/A;   VIDEO BRONCHOSCOPY WITH ENDOBRONCHIAL ULTRASOUND N/A 09/14/2020   Procedure: VIDEO BRONCHOSCOPY WITH ENDOBRONCHIAL ULTRASOUND;  Surgeon: Salena Saner, MD;  Location: [facility] ORS;  Service: Cardiopulmonary;  Laterality: N/A;   Social History:  Social History   Socioeconomic History   Marital status: Single    Spouse name: Not on file   Number of children: Not on file   Years of education: Not on file   Highest education level: Not on file  Occupational History   Occupation: unemployed  Tobacco Use   Smoking status: Former    Current packs/day: 0.00    Average packs/day: 2.0 packs/day for 38.0 years (76.0 ttl pk-yrs)    Types: Cigarettes    Start date: 12/19/1976    Quit date: 12/20/2014    Years since quitting: 8.7   Smokeless tobacco: Never   Tobacco comments:    quit smoking in 2017 most smoked 2   Vaping Use   Vaping status: Former  Substance and Sexual  Activity   Alcohol use: Not Currently   Drug use: No   Sexual activity: Yes  Other Topics Concern   Not on file  Social History Narrative   Not on file   Social Drivers of Health   Financial Resource Strain: Low Risk  (07/27/2023)   Received from [facility]   Overall Financial Resource Strain (CARDIA)    Difficulty of Paying Living Expenses: Not hard at all  Food Insecurity: No Food Insecurity (08/21/2023)   Hunger Vital Sign    Worried About Running Out of Food in the Last Year: Never true    Ran Out of Food in the Last Year: Never true  Transportation Needs: No Transportation Needs (08/21/2023)   PRAPARE - Administrator, Civil Service (Medical): No    Lack of Transportation (Non-Medical): No  Physical Activity: Insufficiently Active (05/29/2023)   Exercise Vital Sign  Days of Exercise per Week: 2 days    Minutes of Exercise per Session: 10 min  Stress: No Stress Concern Present (05/29/2023)   [employer] of Occupational Health - Occupational Stress Questionnaire    Feeling of Stress : Not at all  Social Connections: Socially Isolated (08/21/2023)   Social Connection and Isolation Panel [NHANES]    Frequency of Communication with Friends and Family: More than three times a week    Frequency of Social Gatherings with Friends and Family: Three times a week    Attends Religious Services: Never    Active Member of Clubs or Organizations: No    Attends Banker Meetings: Never    Marital Status: Never married  Intimate Partner Violence: Not At Risk (08/21/2023)   Humiliation, Afraid, Rape, and Kick questionnaire    Fear of Current or Ex-Partner: No    Emotionally Abused: No    Physically Abused: No    Sexually Abused: No   Family History:  Family History  Problem Relation Age of Onset   Diabetes Mother    Hypertension Mother    Heart murmur Mother    Leukemia Mother    Throat cancer Maternal Grandmother    Colon cancer Neg Hx     Breast cancer Neg Hx     Review of Systems: Constitutional: Doesn't report fevers, chills or abnormal weight loss Eyes: Doesn't report blurriness of vision Ears, nose, mouth, throat, and face: Doesn't report sore throat Respiratory: Doesn't report cough, dyspnea or wheezes Cardiovascular: Doesn't report palpitation, chest discomfort  Gastrointestinal:  Doesn't report nausea, constipation, diarrhea GU: Doesn't report incontinence Skin: Doesn't report skin rashes Neurological: Per HPI Musculoskeletal: Doesn't report joint pain Behavioral/Psych: Doesn't report anxiety  Physical Exam: Vitals:   09/04/23 1023  BP: (!) 148/75  Pulse: 71  Resp: 20  Temp: (!) 96.8 F (36 C)  SpO2: 100%    KPS: 80. General: Alert, cooperative, pleasant, in no acute distress Head: Normal EENT: No conjunctival injection or scleral icterus.  Lungs: Resp effort normal Cardiac: Regular rate Abdomen: Non-distended abdomen Skin: No rashes cyanosis or petechiae. Extremities: Left arm in sling  Neurologic Exam: Mental Status: Awake, alert, attentive to examiner. Oriented to self and environment. Language is fluent with intact comprehension.  Cranial Nerves: Visual acuity is grossly normal. Visual fields are full. Extra-ocular movements intact. No ptosis. Face is symmetric Motor: Tone and bulk are normal. Power is full in both arms and legs, with some impairment in fine motor function in right hand. Reflexes are symmetric, no pathologic reflexes present.  Sensory: Intact to light touch Gait: Normal.   Labs: I have reviewed the data as listed    Component Value Date/Time   NA 140 08/26/2023 0500   NA 138 06/02/2023 1049   K 4.2 08/26/2023 0500   CL 105 08/26/2023 0500   CO2 28 08/26/2023 0500   GLUCOSE 103 (H) 08/26/2023 0500   BUN 22 08/26/2023 0500   BUN 18 06/02/2023 1049   BUN 16 07/03/2014 1205   CREATININE 0.84 08/26/2023 0500   CREATININE 1.11 11/06/2022 1041   CREATININE 1.34 (H)  07/03/2014 1205   CALCIUM 8.7 (L) 08/26/2023 0500   PROT 7.6 08/20/2023 2255   PROT 7.0 04/16/2020 1525   ALBUMIN 4.0 08/20/2023 2255   ALBUMIN 4.3 04/16/2020 1525   AST 28 08/20/2023 2255   ALT 26 08/20/2023 2255   ALKPHOS 68 08/20/2023 2255   BILITOT 0.6 08/20/2023 2255   BILITOT 0.4 04/16/2020 1525  GFRNONAA >60 08/26/2023 0500   GFRNONAA >60 11/06/2022 1041   GFRNONAA 59 (L) 07/03/2014 1205   GFRAA >60 03/30/2020 0536   GFRAA >60 07/03/2014 1205   Lab Results  Component Value Date   WBC 6.9 08/26/2023   NEUTROABS 4.7 08/23/2023   HGB 12.5 (L) 08/26/2023   HCT 38.1 (L) 08/26/2023   MCV 83.9 08/26/2023   PLT 222 08/26/2023    Imaging:  [facility] Clinician Interpretation: I have personally reviewed the CNS images as listed.  My interpretation, in the context of the patient's clinical presentation, is treatment effect vs true progression   MR BRAIN W WO CONTRAST Result Date: 08/21/2023 CLINICAL DATA:  Metastatic disease evaluation. History of metastatic lung cancer. EXAM: MRI HEAD WITHOUT AND WITH CONTRAST TECHNIQUE: Multiplanar, multiecho pulse sequences of the brain and surrounding structures were obtained without and with intravenous contrast. CONTRAST:  10mL GADAVIST GADOBUTROL 1 MMOL/ML IV SOLN COMPARISON:  Head CT 08/20/2023 and MRI 08/05/2023 FINDINGS: Brain: Heterogeneously enhancing, partially necrotic lesions measuring up to 2.7 cm in the medial aspect of the left postcentral gyrus (series 11, image 47 and series 12, image 14) and 2.1 cm in the lateral aspect of the left precentral gyrus (series 11, image 33 and series 12, image 20) are stable to minimally larger than on the recent prior MRI. Extensive vasogenic edema in the left frontal and parietal lobes has mildly worsened from the prior MRI with increased rightward midline shift now measuring 7 mm. Mild dural enhancement along the left parietal convexity overlying the postcentral gyrus lesion is unchanged. A 1 cm  peripherally enhancing lesion in the right occipital lobe is unchanged in size (series 11, image 23) with minimal surrounding edema which has slightly increased. A punctate focus of faint residual enhancement corresponding to a known left cerebellar lesion is unchanged (series 11, image 13). No acute infarct or extra-axial fluid collection is evident. There are chronic blood products associated with the left cerebral lesions. Patchy T2 hyperintensities elsewhere in the cerebral white matter bilaterally are unchanged and nonspecific but compatible with mild-to-moderate chronic small vessel ischemic disease. There is partial effacement of the left lateral ventricle. There is no evidence of hydrocephalus. No new enhancing brain lesions are identified. Vascular: Major intracranial vascular flow voids are preserved. Skull and upper cervical spine: No suspicious marrow lesion. Sinuses/Orbits: Unremarkable orbits. Mild mucosal thickening in the paranasal sinuses. Clear mastoid air cells. Other: None. IMPRESSION: 1. Stable to minimally increased size of 2 left cerebral metastases since 08/05/2023. Worsening, extensive vasogenic edema with 7 mm of rightward midline shift. 2. Unchanged size of small right occipital and left cerebellar metastases. 3. No new metastases. Electronically Signed   By: Sebastian Ache M.D.   On: 08/21/2023 10:30   Overnight EEG with video Result Date: 08/21/2023 Charlsie Quest, MD     08/22/2023  7:25 AM Patient Name: Joshua [name] MRN: [id number] Epilepsy Attending: Charlsie Quest Referring Physician/Provider: Erick Blinks, MD Duration: 08/21/2023 0420 to 2304 Patient history: 68 y.o. 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  He  was noted to have twitching of his right side concerning for a seizure. EEG to evaluate for seizure Level of alertness: Awake, asleep AEDs during EEG study: LEV Technical aspects:  This EEG study was done with scalp electrodes positioned according to the 10-20 International system of electrode placement. Electrical activity was reviewed with band pass filter of 1-70Hz , sensitivity of 7 uV/mm, display speed of 3mm/sec with a 60Hz  notched filter applied as appropriate. EEG data were recorded continuously and digitally stored.  Video monitoring was available and reviewed as appropriate. Description: The posterior dominant rhythm consists of 9 Hz activity of moderate voltage (25-35 uV) seen predominantly in posterior head regions, symmetric and reactive to eye opening and eye closing. Sleep was characterized by vertex waves, sleep spindles (12 to 14 Hz), maximal frontocentral region. EEG showed intermittent 3 to 5 Hz theta-delta slowing in left temporal region. Hyperventilation and photic stimulation were not performed.  EEG was disconnected between 0906 to 1001 and between 1554 to 1845 ABNORMALITY - Intermittent slow, left temporal region. IMPRESSION: This study is suggestive of cortical dysfunction arising from left temporal region likely secondary to underlying structural abnormality/ tumor. No seizures or epileptiform discharges were seen throughout the recording. Charlsie Quest   CT HEAD CODE STROKE WO CONTRAST Result Date: 08/20/2023 CLINICAL DATA:  Code stroke.  Neuro deficit, acute, stroke suspected EXAM: CT HEAD WITHOUT CONTRAST TECHNIQUE: Contiguous axial images were obtained from the base of the skull through the vertex without intravenous contrast. RADIATION DOSE REDUCTION: This exam was performed according to the departmental dose-optimization program which includes automated exposure control, adjustment of the mA and/or kV according to patient size and/or use of iterative reconstruction technique.  COMPARISON:  None Available. FINDINGS: Motion limited study.  Within this limitation: Brain: Increasing edema in the left frontoparietal region. No definite new acute hemorrhage. Small focus of hyperdensity in the left frontal region likely represents mineralization or hemorrhage with in the known metastasis, likely present on prior MRI. No obvious superimposed acute large vascular territory infarct. No hydrocephalus. Vascular: Not well assessed. Skull: No acute fracture. Sinuses/Orbits: Clear sinuses.  No acute orbital findings. Other: No mastoid effusions. IMPRESSION: Increasing left frontoparietal edema and mass effect with 9 mm of right midline shift, likely related to worsening metastatic disease given findings on recent MRI head. Repeat MRI head with contrast could confirm and better assess if clinically warranted. Code stroke imaging results were communicated on 08/20/2023 at 11:15 pm to provider [facility] via telephone, who verbally acknowledged these results. Electronically Signed   By: Feliberto Harts M.D.   On: 08/20/2023 23:20   MR BRAIN W WO CONTRAST Result Date: 08/05/2023 CLINICAL DATA:  Metastatic lung cancer, restaging EXAM: MRI HEAD WITHOUT AND WITH CONTRAST TECHNIQUE: Multiplanar, multiecho pulse sequences of the brain and surrounding structures were obtained without and with intravenous contrast. CONTRAST:  9mL GADAVIST GADOBUTROL 1 MMOL/ML IV SOLN COMPARISON:  07/03/2023 FINDINGS: Brain: Redemonstrated peripherally enhancing mass in the medial left postcentral gyrus, which measures up to 2.4 x 2.2 x 1.8 cm (AP x TR x CC) (series 18, image 26 and series 16, image 143), previously 2.0 x 2.1 x 1.8 cm when remeasured similarly. Increased associated T2 hyperintense signal (series 9, image 59). Enhancing mass in the left precentral gyrus measures 1.6 x 1.3 x 1.7 cm (AP x TR x CC) (series 16, image 112 and series 18, image 32), previously 1.5 x 1.3 x 1.8 cm, overall unchanged. Unchanged surrounding  T2 hyperintense signal. Minimal peripheral enhancement about the lesion in the right occipital lobe, which measures up to 0.9 x 0.8 x 0.6 cm (AP x TR x CC) (series 16, image 62 and series 18, image 12),  previously 1.0 x 1.1 x 0.8 cm. Decreased surrounding T2 hyperintense signal. Diminishing enhancement in a previously noted left cerebellar lesion (series 16, image 43). No new abnormal enhancement. No acute infarct, hemorrhage, or midline shift. No hydrocephalus or extra-axial collection. Vascular: Normal arterial flow voids. Normal arterial and venous enhancement. Skull and upper cervical spine: Normal marrow signal. Sinuses/Orbits: Clear paranasal sinuses. No acute finding in the orbits. Other: The mastoid air cells are well aerated. IMPRESSION: 1. Slight interval increase in size of the peripherally enhancing mass in the medial left postcentral gyrus, with increased associated T2 hyperintense signal. 2. Unchanged size of the enhancing mass in the left precentral gyrus. 3. Decreased size of the enhancing lesion in the right occipital lobe, with decreased surrounding T2 hyperintense signal. 4. Diminishing enhancement in a previously noted left cerebellar lesion. 5. No new abnormal enhancement. Electronically Signed   By: Wiliam Ke M.D.   On: 08/05/2023 14:50    Assessment/Plan Malignant neoplasm metastatic to brain [facility])  Seizure (HCC)  Joshua [name] with new onset focal seizures.  These are likely secondary to increased right frontal edema, notable when compared back to September 2024.  The treated lesion underlying the edema is not progressive.   He is having mood effects of the Keppra.  Recommended trial of Lamictal 25mg  daily x7 days, then 50mg  daily x7 days, then 100mg  daily.  Once at 100mg  dose level Keppra can be discontinued.  We reviewed potential for rash for Lamictal.  Also recommended decreasing decadron by 1mg  each week, starting with 3mg  daily tomorrow.  Dose may be  modified if focal symptoms recur.  Epilepsy education and safety were discussed and reviewed.  We appreciate the opportunity to participate in the care of Joshua [name].   We ask that Joshua [name] return to clinic in 1 months following brain MRI, or sooner as needed.  All questions were answered. The patient knows to call the clinic with any problems, questions or concerns. No barriers to learning were detected.  The total time spent in the encounter was 40 minutes and more than 50% was on counseling and review of test results   Henreitta Leber, MD Medical Director of Neuro-Oncology [facility] 09/04/23 10:35 AM

## 2023-09-07 ENCOUNTER — Other Ambulatory Visit: Payer: Self-pay

## 2023-09-07 ENCOUNTER — Other Ambulatory Visit (HOSPITAL_COMMUNITY): Payer: Self-pay

## 2023-09-07 ENCOUNTER — Encounter: Payer: Self-pay | Admitting: Oncology

## 2023-09-07 MED ORDER — ERGOCALCIFEROL 1.25 MG (50000 UT) PO CAPS
1250.0000 ug | ORAL_CAPSULE | ORAL | 0 refills | Status: DC
Start: 2023-09-07 — End: 2023-09-09
  Filled 2023-09-07 (×2): qty 4, 28d supply, fill #0

## 2023-09-09 ENCOUNTER — Encounter: Payer: Self-pay | Admitting: Oncology

## 2023-09-09 ENCOUNTER — Inpatient Hospital Stay: Payer: HMO | Admitting: Oncology

## 2023-09-09 ENCOUNTER — Inpatient Hospital Stay: Payer: HMO

## 2023-09-09 ENCOUNTER — Inpatient Hospital Stay (HOSPITAL_BASED_OUTPATIENT_CLINIC_OR_DEPARTMENT_OTHER): Payer: HMO | Admitting: Oncology

## 2023-09-09 VITALS — BP 140/75 | HR 72 | Temp 97.9°F | Resp 18 | Wt 217.5 lb

## 2023-09-09 DIAGNOSIS — C7931 Secondary malignant neoplasm of brain: Secondary | ICD-10-CM

## 2023-09-09 DIAGNOSIS — Z5112 Encounter for antineoplastic immunotherapy: Secondary | ICD-10-CM

## 2023-09-09 DIAGNOSIS — E032 Hypothyroidism due to medicaments and other exogenous substances: Secondary | ICD-10-CM

## 2023-09-09 DIAGNOSIS — C3491 Malignant neoplasm of unspecified part of right bronchus or lung: Secondary | ICD-10-CM | POA: Diagnosis not present

## 2023-09-09 DIAGNOSIS — R569 Unspecified convulsions: Secondary | ICD-10-CM | POA: Diagnosis not present

## 2023-09-09 LAB — COMPREHENSIVE METABOLIC PANEL
ALT: 49 U/L — ABNORMAL HIGH (ref 0–44)
AST: 20 U/L (ref 15–41)
Albumin: 3.6 g/dL (ref 3.5–5.0)
Alkaline Phosphatase: 88 U/L (ref 38–126)
Anion gap: 10 (ref 5–15)
BUN: 26 mg/dL — ABNORMAL HIGH (ref 8–23)
CO2: 26 mmol/L (ref 22–32)
Calcium: 9.2 mg/dL (ref 8.9–10.3)
Chloride: 104 mmol/L (ref 98–111)
Creatinine, Ser: 0.96 mg/dL (ref 0.61–1.24)
GFR, Estimated: >60 mL/min (ref >60)
Glucose, Bld: 110 mg/dL — ABNORMAL HIGH (ref 70–99)
Potassium: 4 mmol/L (ref 3.5–5.1)
Sodium: 140 mmol/L (ref 135–145)
Total Bilirubin: 0.6 mg/dL (ref 0.0–1.2)
Total Protein: 6.5 g/dL (ref 6.5–8.1)

## 2023-09-09 LAB — CBC WITH DIFFERENTIAL/PLATELET
Abs Immature Granulocytes: 0.06 10*3/uL (ref 0.00–0.07)
Basophils Absolute: 0 10*3/uL (ref 0.0–0.1)
Basophils Relative: 0 %
Eosinophils Absolute: 0.1 10*3/uL (ref 0.0–0.5)
Eosinophils Relative: 1 %
HCT: 41.3 % (ref 39.0–52.0)
Hemoglobin: 13.6 g/dL (ref 13.0–17.0)
Immature Granulocytes: 1 %
Lymphocytes Relative: 14 %
Lymphs Abs: 1.5 10*3/uL (ref 0.7–4.0)
MCH: 28.2 pg (ref 26.0–34.0)
MCHC: 32.9 g/dL (ref 30.0–36.0)
MCV: 85.7 fL (ref 80.0–100.0)
Monocytes Absolute: 0.9 10*3/uL (ref 0.1–1.0)
Monocytes Relative: 9 %
Neutro Abs: 8 10*3/uL — ABNORMAL HIGH (ref 1.7–7.7)
Neutrophils Relative %: 75 %
Platelets: 207 10*3/uL (ref 150–400)
RBC: 4.82 MIL/uL (ref 4.22–5.81)
RDW: 16.4 % — ABNORMAL HIGH (ref 11.5–15.5)
WBC: 10.5 10*3/uL (ref 4.0–10.5)
nRBC: 0 % (ref 0.0–0.2)

## 2023-09-09 MED ORDER — HEPARIN SOD (PORK) LOCK FLUSH 100 UNIT/ML IV SOLN
500.0000 [IU] | Freq: Once | INTRAVENOUS | Status: AC | PRN
Start: 2023-09-09 — End: 2023-09-09
  Administered 2023-09-09: 500 [IU]
  Filled 2023-09-09: qty 5

## 2023-09-09 MED ORDER — SODIUM CHLORIDE 0.9 % IV SOLN
Freq: Once | INTRAVENOUS | Status: AC
  Filled 2023-09-09: qty 250

## 2023-09-09 MED ORDER — SODIUM CHLORIDE 0.9 % IV SOLN
10.0000 mg/kg | Freq: Once | INTRAVENOUS | Status: AC
  Administered 2023-09-09: 1000 mg via INTRAVENOUS
  Filled 2023-09-09: qty 20

## 2023-09-09 NOTE — Assessment & Plan Note (Signed)
 continue levothyroxine 125 mcg daily

## 2023-09-09 NOTE — Assessment & Plan Note (Signed)
Status post radiation,  02/19/22 MRI showed decreased brain lesion size and edema.  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 Mitzi Hansen.  Follow up with Dr. Barbaraann Cao. 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

## 2023-09-09 NOTE — Assessment & Plan Note (Signed)
 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  May 2024 CT scan showed Improving bilateral scattered interstitial and ground-glass opacities. Discussed with pulmonology Dr. Jayme Cloud. 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  Labs are reviewed and discussed with patient. Proceed with  Durvalumab.

## 2023-09-09 NOTE — Progress Notes (Signed)
Hematology/Oncology Progress note [phone] [phone]    CHIEF COMPLAINTS/REASON FOR VISIT:  Follow up for lung cancer   ASSESSMENT & PLAN:   Cancer Staging  Squamous cell carcinoma of right lung (HCC) Staging form: Lung, AJCC 8th Edition - Clinical stage from 09/20/2020: Stage IV (cT1b, cN2, cM1) - Signed by [name], MD on 12/13/2021   Squamous cell carcinoma of right lung (HCC) Durvalumab was previously held due to pneumonitis.  May 2024 CT scan showed Improving bilateral scattered interstitial and ground-glass opacities. Discussed with pulmonology Dr. Jayme Cloud. 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  Labs are reviewed and discussed with patient. Proceed with  Durvalumab.   Seizure (HCC) Follow up with Dr. Barbaraann Cao.  Continue keppra,lamotrigine. and  dexamethasone 4mg  daily.   Encounter for antineoplastic immunotherapy Durvalumab treatment as planned.   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 Mitzi Hansen.  Follow up with Dr. Barbaraann Cao. 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     Follow up 3 weeks lab MD Durvalumab All questions were answered. The patient knows to call the clinic with any problems, questions or concerns.  [name], MD, PhD [facility] Hematology Oncology 09/09/2023       HISTORY OF PRESENTING  ILLNESS:  Joshua [name] is a 68 y.o. male who has above history reviewed by me today presents for follow up visit for Stage IV lung squamous cell carcinoma. Oncology History  Squamous cell carcinoma of right lung (HCC)  09/18/2020 Imaging   PET scan showed right upper lobe nodule maximal SUV 11.5.  Hypermetabolic cluster right paratracheal adenopathy with maximum SUV up to 12.  No findings of metastatic disease to the neck/abdomen/pelvis or skeleton.   09/20/2020 Initial Diagnosis   Squamous cell carcinoma of right lung (HCC) NGS showed PD-L1 TPS 95%, LRP1B S985fs, PIK3CA E545K, RB1 c1422-2A>T, RIT1 T83_ A84del, STK11 P21fs, TPS53 R248W, TMB 19.61mut/mb, MS stable.    09/20/2020 Cancer Staging   Staging form: Lung, AJCC 8th Edition - Clinical stage from 09/20/2020: Stage IV (cT1b, cN2, cM1) - Signed by [name], MD on 12/13/2021   10/03/2020 Imaging   MRI brain with and without contrast showed no evidence of intracranial metastatic disease. Well-defined round lesion within the left parotid tail measuring approximately 2.3 x 1.8 x 1.8 cm   10/09/2020 - 11/27/2020 Radiation Therapy   concurrent chemoradiation   10/10/2020 - 10/31/2020 Chemotherapy   10/10/2020 concurrent chemoradiation.  carboplatin AUC of 2 and Taxol 45 mg/m2 x 4.  Additional chemotherapy was held due to thrombocytopenia, neutropenia       12/31/2020 - 03/21/2022 Chemotherapy   Maintenance  Durvalumab q14d      12/31/2020 -  Chemotherapy   Patient is on Treatment Plan : LUNG Durvalumab (10) q14d     03/28/2021 Imaging   CT without contrast showed new linear right perihilar consolidation with associated traction bronchiectasis.  Likely postradiation changes.  Additional new peribronchial vascular/nodular groundglass opacities with areas  of consolidation or seen primarily in the right upper and lower lobes, more distant from expected radiation fields. 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  Per my discussion with pulmonology Dr. 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     05/09/2021 Imaging   Repeat CT chest without contrast showed evolving radiation changes in the right perihilar region likely accounting for interval increase to thickening of the posterior wall of the right upper lobe bronchus.  The more peripheral right lung groundglass opacities seen on most recent study have generally improved with exception of one peripherally right upper lobe which appears new.  These are likely inflammatory.  No findings highly suspicious for local recurrence.   05/30/2021 - 01/10/2022 Chemotherapy   Durvalumab q14d     10/07/2021 Imaging   MRI brain with and without contrast showed 2 contrast-enhancing lesions, most consistent with metastatic disease.  Largest lesion is in the left frontal lobe and measures up to 1.3 cm with a large amount of surrounding edema and 9 mm rightward midline shift.  The other enhancing lesion was 4 mm in the right occipital lobe with a small amount of surrounding edema.  No acute or chronic hemorrhage.   10/07/2021 Progression   Headache after colonoscopy, progressive difficulty using her right arm.  Progressively worsening of speech. Stage IV lung cancer with brain metastasis.  started on Dexamethasone.  He was seen by neurosurgery and radiation oncology.  Recommendation is to proceed with SRS.  Patient was discharged with dexamethasone   10/16/2021 Imaging   PET scan showed postradiation changes in the right supra hilar lung without evidence of local recurrence.  No evidence of lung cancer recurrence or metastatic disease.  Single focus of intense metabolic activity associated with posterior left 11th rib likely secondary to recent fall/posttraumatic metabolic changes.  Frontal  uptake in the left frontal lobe corresponding to the known metastatic lesion in the brain    10/29/2021 -  Radiation Therapy   Brain Radiation [facility]   12/10/2021 Imaging   Brain MRI: Continued interval decrease in size of a left frontal lobe metastasis, now measuring 10 mm. Moderate surrounding edema has also decreased.  3-4 mm metastasis within the right occipital lobe, not significantly changed in size. There is now a small focus of central non enhancement within this lesion. Increased edema. 3 mm enhancing metastasis within the high left parietal lobe. In retrospect, this was likely present as a subtle punctate focus of enhancement on the prior brain MRI of 10/22/2021.   01/18/2022 Imaging   CT chest abdomen pelvis w contrast 1. Stable post treatment changes in the right upper lobe medially with dense radiation fibrosis but no findings to suggest local recurrent tumor. 2. Significant progression of ill-defined ground-glass opacity, interstitial thickening and airspace nodularity in the right upper lobe and upper aspect of the right lower lobe. Findings could reflect an inflammatory or atypical infectious process, drug induced pneumonitis or interstitial spread of tumor.3. No mediastinal or hilar mass or adenopathy. 4. No findings for abdominal/pelvic metastatic disease or osseous metastatic disease.   02/19/2022 Imaging   MRI brain w wo contrast showed Three metastatic deposits in the brain appear smaller. Decreased edema in the left parietal lobe. No new lesions    04/04/2022 Imaging   CT chest w contrast 1. Stable radiation changes in the right perihilar region without evidence of local recurrence. 2. Interval improvement  in the patchy ground-glass opacities previously demonstrated in both lungs, most consistent with a resolving infectious or inflammatory process (including immunotherapy-related pneumonitis). 3. No evidence of metastatic disease.   05/14/2022 Miscellaneous   He had a mechanical fall and fractured his left elbow. xrays showed a left elbow fracture  05/26/22 seen by  orthopedic surgeon, cast applied.    05/27/2022 Imaging   MRI brain w wo contrast 1. All three known brain metastases have enlarged since August, with increasing regional edema. And there is a small new left anterior cerebellar 4-5 mm metastasis (was questionably punctate on the May MRI). This constellation could reflect a combination of True Progression (Left Cerebellar lesion) and pseudoprogression/radionecrosis (3 existing mets).    2. No significant intracranial mass effect. 3. Underlying moderate cerebral white matter signal changes.   05/30/2022 Imaging   CT chest abdomen pelvis w contrast  1. 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 There is a stable small right pleural effusion, but no definitive findings to suggest residual or recurrent disease on today's examination. 2. No signs of metastatic disease in the abdomen or pelvis.  3. Aortic atherosclerosis, in addition to two-vessel coronary artery disease. Please note that although the presence of coronary artery calcium documents the presence of coronary artery disease, the severity of this disease and any potential stenosis cannot be assessed on this non-gated CT examination. Assessment for potential risk factor modification, dietary therapy or pharmacologic therapy may be warranted, if clinically indicated. 4. Hepatic steatosis. 5. Colonic diverticulosis without evidence of acute diverticulitis at this time. 6. Incompletely imaged but apparently occluded vascular graft in the superior aspect of the right anterior thigh. Referral to vascular specialist for further clinical evaluation should be considered if clinically appropriate. 7. Additional incidental findings, as above.   07/22/2022 Imaging   MRI brain  1. No new or enlarging lesions. No intracranial disease progression. 2. Decreased size of treated lesions of  the left frontoparietal junction and left postcentral gyrus. 3. Unchanged lesions in the left cerebellum and right occipital lobe.   10/15/2022 Imaging   MRI brain w wo contrast showed Interval increase in size of multiple intraparenchymal lesions with worsened surrounding vasogenic edema. 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 No new lesions   11/05/2022 Imaging   MRI brain w wo contrast  1. Likely slight increase and right occipital and left cerebellar lesions. Otherwise, unchanged metastases, as above. No new lesions identified. 2. Partially imaged large probable lymph node in the left upper neck, apparent on prior PET CT.   11/13/2022 -  Radiation Therapy   Each of the sites were treated with SRS/SBRT/SRT-IMRT technique to 20 Gy in 1 fraction to each of the following sites: PTV_2_LCerebel_29mm PTV_3_RCerebel_28mm PTV_4_ROccip_46mm PTV_5_LParietal_60mm   11/27/2022 Imaging   CT chest abdomen pelvis w contrast showed  Improving bilateral scattered interstitial and ground-glass opacities. Small amount of residual areas in the extreme left lower lobe inferiorly. This specific area is slightly increased from previous but overall the amount of opacities is significantly improved. Recommend continued follow-up. No developing new mass lesion, fluid collection or lymph node enlargement. Stable posttreatment changes right perihilar in the lung. Fatty liver infiltration. Colonic diverticulosis. Improved tiny right pleural effusion   02/05/2023 Imaging   MRI brain with and without contrast Interval decrease in all of the metastatic lesions, detailed above.No new lesions identified.    Imaging  CT chest abdomen pelvis w contrast showed  1. 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 2. Trace right pleural fluid or thickening, new or minimally increased. 3.  Incidental findings, including: Aortic atherosclerosis (ICD10-I70.0), coronary artery atherosclerosis and emphysema (ICD10-J43.9). Hepatic steatosis.   06/25/2023 Imaging   CT chest abdomen pelvis w contrast showed 1. Unchanged post treatment/post radiation appearance of the right lung, with perihilar and suprahilar paramedian fibrosis and consolidation. 2. No evidence of lymphadenopathy or metastatic disease in the chest, abdomen, or pelvis. 3. Hepatic steatosis. 4. Coronary artery disease.   Aortic Atherosclerosis (ICD10-I70.0) and Emphysema (ICD10-J43.9).    Today he feels well.Intermittent headache, is better. denies any focal deficits. chronic SOB,not worse.  08/26/2023 hospitalization due to was observed speech and jerking movements on right side despite being on dexamethasone. Continuous EEG suggest cortical dysfunction arising from left temporal region.  No seizure or elective form discharges were seen. Patient was discharged on dexamethasone 4 mg, Keppra, lamotrigine.  He has upcoming appointment with Dr. Barbaraann Cao.  Today he denies any additional jerking movements.   Review of Systems  Constitutional:  Positive for fatigue. Negative for appetite change, chills, fever and unexpected weight change.  HENT:   Negative for hearing loss and voice change.   Eyes:  Negative for eye problems and icterus.  Respiratory:  Negative for chest tightness, cough and shortness of breath.   Cardiovascular:  Negative for chest pain and leg swelling.  Gastrointestinal:  Negative for abdominal distention and abdominal pain.  Endocrine: Negative for hot flashes.  Genitourinary:  Negative for difficulty urinating, dysuria and frequency.   Musculoskeletal:  Negative for arthralgias.  Skin:  Negative for itching and rash.  Neurological:  Negative for extremity weakness, headaches, light-headedness, numbness and speech difficulty.  Hematological:  Negative for adenopathy. Does not bruise/bleed easily.   Psychiatric/Behavioral:  Negative for confusion.       MEDICAL HISTORY:  Past Medical History:  Diagnosis Date   Allergy    Cancer (HCC)    lung   COPD (chronic obstructive pulmonary disease) (HCC)    Dyspnea    former smoker. 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   Malignant neoplasm metastatic to brain [facility])    Peripheral vascular disease (HCC)    Pre-diabetes    Sleep apnea     SURGICAL HISTORY: Past Surgical History:  Procedure Laterality Date   ANTERIOR CERVICAL DECOMP/DISCECTOMY FUSION N/A 03/14/2020   Procedure: ANTERIOR CERVICAL DECOMPRESSION/DISCECTOMY FUSION 3 LEVELS C4-7;  Surgeon: Venetia Night, MD;  Location: [facility] ORS;  Service: Neurosurgery;  Laterality: N/A;   BACK SURGERY  2011   CENTRAL LINE INSERTION Right 09/10/2016   Procedure: CENTRAL LINE INSERTION;  Surgeon: Renford Dills, MD;  Location: [facility] ORS;  Service: Vascular;  Laterality: Right;   CERVICAL FUSION     C4-c7 on Mar 14, 2020   COLONOSCOPY  2013 ?   COLONOSCOPY N/A 09/09/2021   Procedure: COLONOSCOPY;  Surgeon: Jaynie Collins, DO;  Location: [facility] ENDOSCOPY;  Service: Gastroenterology;  Laterality: N/A;   CORONARY ANGIOPLASTY     ESOPHAGOGASTRODUODENOSCOPY N/A 09/09/2021   Procedure: ESOPHAGOGASTRODUODENOSCOPY (EGD);  Surgeon: Jaynie Collins, DO;  Location: [facility] ENDOSCOPY;  Service: Gastroenterology;  Laterality: N/A;   FEMORAL-POPLITEAL BYPASS GRAFT Right 09/10/2016   Procedure: BYPASS GRAFT FEMORAL-POPLITEAL ARTERY ( BELOW KNEE );  Surgeon: Renford Dills, MD;  Location: [facility] ORS;  Service:  Vascular;  Laterality: Right;   FEMORAL-TIBIAL BYPASS GRAFT Right 03/23/2020   Procedure: BYPASS GRAFT RIGHT FEMORAL- DISTAL TIBIAL ARTERY WITH DISTAL FLOW GRAFT;  Surgeon: Renford Dills, MD;  Location: [facility] ORS;  Service: Vascular;  Laterality: Right;   LEFT HEART CATH AND CORONARY ANGIOGRAPHY Left 05/16/2021   Procedure:  LEFT HEART CATH AND CORONARY ANGIOGRAPHY;  Surgeon: Armando Reichert, MD;  Location: [facility] INVASIVE CV LAB;  Service: Cardiovascular;  Laterality: Left;   LOWER EXTREMITY ANGIOGRAPHY Right 11/18/2016   Procedure: Lower Extremity Angiography;  Surgeon: Renford Dills, MD;  Location: [facility] INVASIVE CV LAB;  Service: Cardiovascular;  Laterality: Right;   LOWER EXTREMITY ANGIOGRAPHY Right 12/09/2016   Procedure: Lower Extremity Angiography;  Surgeon: Renford Dills, MD;  Location: [facility] INVASIVE CV LAB;  Service: Cardiovascular;  Laterality: Right;   LOWER EXTREMITY ANGIOGRAPHY Right 11/15/2019   Procedure: LOWER EXTREMITY ANGIOGRAPHY;  Surgeon: Renford Dills, MD;  Location: [facility] INVASIVE CV LAB;  Service: Cardiovascular;  Laterality: Right;   LOWER EXTREMITY ANGIOGRAPHY Right 12/01/2019   Procedure: Lower Extremity Angiography;  Surgeon: Annice Needy, MD;  Location: [facility] INVASIVE CV LAB;  Service: Cardiovascular;  Laterality: Right;   LOWER EXTREMITY ANGIOGRAPHY Right 12/02/2019   Procedure: LOWER EXTREMITY ANGIOGRAPHY;  Surgeon: Renford Dills, MD;  Location: [facility] INVASIVE CV LAB;  Service: Cardiovascular;  Laterality: Right;   LOWER EXTREMITY ANGIOGRAPHY Right 03/23/2020   Procedure: Lower Extremity Angiography;  Surgeon: Renford Dills, MD;  Location: [facility] INVASIVE CV LAB;  Service: Cardiovascular;  Laterality: Right;   LOWER EXTREMITY ANGIOGRAPHY Right 09/25/2021   Procedure: Lower Extremity Angiography;  Surgeon: Renford Dills, MD;  Location: [facility] INVASIVE CV LAB;  Service: Cardiovascular;  Laterality: Right;   LOWER EXTREMITY ANGIOGRAPHY Right 09/26/2021   Procedure: Lower Extremity Angiography;  Surgeon: Annice Needy, MD;  Location: [facility] INVASIVE CV LAB;  Service: Cardiovascular;  Laterality: Right;   LOWER EXTREMITY ANGIOGRAPHY Right 04/08/2022   Procedure: Lower Extremity Angiography;  Surgeon: Renford Dills, MD;  Location: [facility] INVASIVE CV LAB;  Service: Cardiovascular;   Laterality: Right;   LOWER EXTREMITY INTERVENTION  11/18/2016   Procedure: Lower Extremity Intervention;  Surgeon: Renford Dills, MD;  Location: [facility] INVASIVE CV LAB;  Service: Cardiovascular;;   PERIPHERAL VASCULAR CATHETERIZATION Right 01/02/2015   Procedure: Lower Extremity Angiography;  Surgeon: Renford Dills, MD;  Location: [facility] INVAS[facility] CV LAB;  Service: Cardiovascular;  Laterality: Right;   PERIPHERAL VASCULAR CATHETERIZATION Right 06/18/2015   Procedure: Lower Extremity Angiography;  Surgeon: Annice Needy, MD;  Location: [facility] INV[facility] LAB;  Service: Cardiovascular;  Laterality: Right;   PERIPHERAL VASCULAR CATHETERIZATION  06/18/2015   Procedure: Lower Extremity Intervention;  Surgeon: Annice Needy, MD;  Location: [facility] INV[facility] CV LAB;  Service: Cardiovascular;;   PERIPHERAL VASCULAR CATHETERIZATION N/A 10/09/2015   Procedure: Abdominal Aortogram w/Lower Extremity;  Surgeon: Renford Dills, MD;  Location: [facility] INV[facility] CV LAB;  Service: Cardiovascular;  Laterality: N/A;   PERIPHERAL VASCULAR CATHETERIZATION  10/09/2015   Procedure: Lower Extremity Intervention;  Surgeon: Renford Dills, MD;  Location: [facility] INV[facility] CV LAB;  Service: Cardiovascular;;   PERIPHERAL VASCULAR CATHETERIZATION Right 10/10/2015   Procedure: Lower Extremity Angiography;  Surgeon: Annice Needy, MD;  Location: [facility] IN[facility] LAB;  Service: Cardiovascular;  Laterality: Right;   PERIPHERAL VASCULAR CATHETERIZATION Right 10/10/2015   Procedure: Lower Extremity Intervention;  Surgeon: Annice Needy, MD;  Location: [facility] INV[facility] LAB;  Service: Cardiovascular;  Laterality: Right;  PERIPHERAL VASCULAR CATHETERIZATION Right 12/03/2015   Procedure: Lower Extremity Angiography;  Surgeon: Annice Needy, MD;  Location: [facility];  Service: Cardiovascular;  Laterality: Right;   PERIPHERAL VASCULAR CATHETERIZATION Right 12/04/2015   Procedure: Lower Extremity Angiography;  Surgeon: Annice Needy, MD;  Location:  [facility] INV[facility] LAB;  Service: Cardiovascular;  Laterality: Right;   PERIPHERAL VASCULAR CATHETERIZATION  12/04/2015   Procedure: Lower Extremity Intervention;  Surgeon: Annice Needy, MD;  Location: [facility] IN[facility] LAB;  Service: Cardiovascular;;   PERIPHERAL VASCULAR CATHETERIZATION Right 06/30/2016   Procedure: Lower Extremity Angiography;  Surgeon: Annice Needy, MD;  Location: [facility] INV[facility] LAB;  Service: Cardiovascular;  Laterality: Right;   PERIPHERAL VASCULAR CATHETERIZATION Right 06/25/2016   Procedure: Lower Extremity Angiography;  Surgeon: Renford Dills, MD;  Location: [facility] IN[facility] LAB;  Service: Cardiovascular;  Laterality: Right;   PERIPHERAL VASCULAR CATHETERIZATION Right 07/01/2016   Procedure: Lower Extremity Angiography;  Surgeon: Renford Dills, MD;  Location: [facility] IN[facility];  Service: Cardiovascular;  Laterality: Right;   PERIPHERAL VASCULAR CATHETERIZATION  07/01/2016   Procedure: Lower Extremity Intervention;  Surgeon: Renford Dills, MD;  Location: [facility] INV[facility] CV LAB;  Service: Cardiovascular;;   PERIPHERAL VASCULAR CATHETERIZATION Right 08/01/2016   Procedure: Lower Extremity Angiography;  Surgeon: Renford Dills, MD;  Location: [facility] INV[facility] CV LAB;  Service: Cardiovascular;  Laterality: Right;   PORTA CATH INSERTION N/A 10/05/2020   Procedure: PORTA CATH INSERTION;  Surgeon: Renford Dills, MD;  Location: [facility] INVASIVE CV LAB;  Service: Cardiovascular;  Laterality: N/A;   stent placement in right leg Right    VIDEO BRONCHOSCOPY N/A 09/14/2020   Procedure: VIDEO BRONCHOSCOPY WITH FLUORO;  Surgeon: Salena Saner, MD;  Location: [facility] ORS;  Service: Cardiopulmonary;  Laterality: N/A;   VIDEO BRONCHOSCOPY WITH ENDOBRONCHIAL ULTRASOUND N/A 09/14/2020   Procedure: VIDEO BRONCHOSCOPY WITH ENDOBRONCHIAL ULTRASOUND;  Surgeon: Salena Saner, MD;  Location: [facility] ORS;  Service: Cardiopulmonary;  Laterality: N/A;    SOCIAL HISTORY: Social History    Socioeconomic History   Marital status: Single    Spouse name: Not on file   Number of children: Not on file   Years of education: Not on file   Highest education level: Not on file  Occupational History   Occupation: unemployed  Tobacco Use   Smoking status: Former    Current packs/day: 0.00    Average packs/day: 2.0 packs/day for 38.0 years (76.0 ttl pk-yrs)    Types: Cigarettes    Start date: 12/19/1976    Quit date: 12/20/2014    Years since quitting: 8.7   Smokeless tobacco: Never   Tobacco comments:    quit smoking in 2017 most smoked 2   Vaping Use   Vaping status: Former  Substance and Sexual Activity   Alcohol use: Not Currently   Drug use: No   Sexual activity: Yes  Other Topics Concern   Not on file  Social History Narrative   Not on file   Social Drivers of Health   Financial Resource Strain: Low Risk  (07/27/2023)   Received from [facility]   Overall Financial Resource Strain (CARDIA)    Difficulty of Paying Living Expenses: Not hard at all  Food Insecurity: No Food Insecurity (08/21/2023)   Hunger Vital Sign    Worried About Running Out of Food in the Last Year: Never true    Ran Out of Food in the Last Year: Never true  Transportation Needs: No Transportation Needs (08/21/2023)   PRAPARE - Administrator, Civil Service (Medical): No    Lack of Transportation (Non-Medical): No  Physical Activity: Insufficiently Active (05/29/2023)   Exercise Vital Sign    Days of Exercise per Week: 2 days    Minutes of Exercise per Session: 10 min  Stress: No Stress Concern Present (05/29/2023)   [employer] of Occupational Health - Occupational Stress Questionnaire    Feeling of Stress : Not at all  Social Connections: Socially Isolated (08/21/2023)   Social Connection and Isolation Panel [NHANES]    Frequency of Communication with Friends and Family: More than three times a week    Frequency of Social Gatherings with Friends and  Family: Three times a week    Attends Religious Services: Never    Active Member of Clubs or Organizations: No    Attends Banker Meetings: Never    Marital Status: Never married  Intimate Partner Violence: Not At Risk (08/21/2023)   Humiliation, Afraid, Rape, and Kick questionnaire    Fear of Current or Ex-Partner: No    Emotionally Abused: No    Physically Abused: No    Sexually Abused: No    FAMILY HISTORY: Family History  Problem Relation Age of Onset   Diabetes Mother    Hypertension Mother    Heart murmur Mother    Leukemia Mother    Throat cancer Maternal Grandmother    Colon cancer Neg Hx    Breast cancer Neg Hx     ALLERGIES:  has no known allergies.  MEDICATIONS:  Current Outpatient Medications  Medication Sig Dispense Refill   clopidogrel (PLAVIX) 75 MG tablet Take 1 tablet (75 mg total) by mouth daily. 100 tablet 2   dexamethasone (DECADRON) 1 MG tablet Take 3 tablets (3 mg total) by mouth daily with breakfast for 7 days, THEN 2 tablets (2 mg total) daily with breakfast for 7 days, THEN 1 tablet (1 mg total) daily with breakfast for 7 days. 42 tablet 0   [START ON 09/18/2023] lamoTRIgine (LAMICTAL) 100 MG tablet Take 1 tablet (100 mg total) by mouth daily. 60 tablet 1   lamoTRIgine (LAMICTAL) 25 MG tablet Take 1 tablet (25 mg total) by mouth daily for 7 days, THEN 2 tablets (50 mg total) daily for 7 days. 21 tablet 0   levETIRAcetam (KEPPRA) 1000 MG tablet Take 1 tablet (1,000 mg total) by mouth 2 (two) times daily. 60 tablet 0   levothyroxine (LEVOXYL) 125 MCG tablet Take 1 tablet (125 mcg total) by mouth daily before breakfast. 90 tablet 0   rosuvastatin (CRESTOR) 40 MG tablet Take 1 tablet (40 mg total) by mouth daily. 90 tablet 3   aspirin EC 81 MG tablet Take 81 mg by mouth daily. Swallow whole.     clopidogrel (PLAVIX) 75 MG tablet TAKE 1 TABLET BY MOUTH DAILY 100 tablet 2   No current facility-administered medications for this visit.    Facility-Administered Medications Ordered in Other Visits  Medication Dose Route Frequency Provider Last Rate Last Admin   albuterol (PROVENTIL) (2.5 MG/3ML) 0.083% nebulizer solution 2.5 mg  2.5 mg Nebulization Once Salena Saner, MD       heparin lock flush 100 unit/mL  500 Units Intracatheter Once PRN [name], MD         PHYSICAL EXAMINATION: ECOG PERFORMANCE STATUS: 1 - Symptomatic but completely ambulatory Vitals:   09/09/23 0834  BP: (!) 140/75  Pulse: 72  Resp:  18  Temp: 97.9 F (36.6 C)  SpO2: 100%    Filed Weights   09/09/23 0834  Weight: 217 lb 8 oz (98.7 kg)     Physical Exam Constitutional:      General: He is not in acute distress.    Appearance: He is obese.     Comments: Walks with a cane  HENT:     Head: Normocephalic and atraumatic.  Eyes:     General: No scleral icterus. Cardiovascular:     Rate and Rhythm: Normal rate and regular rhythm.  Pulmonary:     Effort: Pulmonary effort is normal. No respiratory distress.     Breath sounds: Normal breath sounds. No wheezing.  Abdominal:     General: There is no distension.     Palpations: Abdomen is soft.     Tenderness: There is no abdominal tenderness.  Musculoskeletal:     Cervical back: Normal range of motion and neck supple.  Skin:    General: Skin is warm and dry.     Findings: No erythema.  Neurological:     Mental Status: He is alert and oriented to person, place, and time. Mental status is at baseline.     Cranial Nerves: No cranial nerve deficit.     Motor: No abnormal muscle tone.  Psychiatric:        Mood and Affect: Mood and affect normal.      LABORATORY DATA:  I have reviewed the data as listed    Latest Ref Rng & Units 09/09/2023    7:54 AM 08/26/2023    5:00 AM 08/23/2023    5:15 AM  CBC  WBC 4.0 - 10.5 K/uL 10.5  6.9  5.8   Hemoglobin 13.0 - 17.0 g/dL 64.4  03.4  74.2   Hematocrit 39.0 - 52.0 % 41.3  38.1  37.0   Platelets 150 - 400 K/uL 207  222  184        Latest Ref Rng & Units 09/09/2023    7:54 AM 08/26/2023    5:00 AM 08/23/2023    5:15 AM  CMP  Glucose 70 - 99 mg/dL 595  638  756   BUN 8 - 23 mg/dL 26  22  22    Creatinine 0.61 - 1.24 mg/dL 4.33  2.95  1.88   Sodium 135 - 145 mmol/L 140  140  141   Potassium 3.5 - 5.1 mmol/L 4.0  4.2  3.8   Chloride 98 - 111 mmol/L 104  105  106   CO2 22 - 32 mmol/L 26  28  26    Calcium 8.9 - 10.3 mg/dL 9.2  8.7  8.5   Total Protein 6.5 - 8.1 g/dL 6.5     Total Bilirubin 0.0 - 1.2 mg/dL 0.6     Alkaline Phos 38 - 126 U/L 88     AST 15 - 41 U/L 20     ALT 0 - 44 U/L 49        RADIOGRAPHIC STUDIES: I have personally reviewed the radiological images as listed and agreed with the findings in the report. MR BRAIN W WO CONTRAST Result Date: 08/21/2023 CLINICAL DATA:  Metastatic disease evaluation. History of metastatic lung cancer. EXAM: MRI HEAD WITHOUT AND WITH CONTRAST TECHNIQUE: Multiplanar, multiecho pulse sequences of the brain and surrounding structures were obtained without and with intravenous contrast. CONTRAST:  10mL GADAVIST GADOBUTROL 1 MMOL/ML IV SOLN COMPARISON:  Head CT 08/20/2023 and MRI 08/05/2023 FINDINGS: Brain: Heterogeneously enhancing, partially  necrotic lesions measuring up to 2.7 cm in the medial aspect of the left postcentral gyrus (series 11, image 47 and series 12, image 14) and 2.1 cm in the lateral aspect of the left precentral gyrus (series 11, image 33 and series 12, image 20) are stable to minimally larger than on the recent prior MRI. Extensive vasogenic edema in the left frontal and parietal lobes has mildly worsened from the prior MRI with increased rightward midline shift now measuring 7 mm. Mild dural enhancement along the left parietal convexity overlying the postcentral gyrus lesion is unchanged. A 1 cm peripherally enhancing lesion in the right occipital lobe is unchanged in size (series 11, image 23) with minimal surrounding edema which has slightly increased. A punctate focus  of faint residual enhancement corresponding to a known left cerebellar lesion is unchanged (series 11, image 13). No acute infarct or extra-axial fluid collection is evident. There are chronic blood products associated with the left cerebral lesions. Patchy T2 hyperintensities elsewhere in the cerebral white matter bilaterally are unchanged and nonspecific but compatible with mild-to-moderate chronic small vessel ischemic disease. There is partial effacement of the left lateral ventricle. There is no evidence of hydrocephalus. No new enhancing brain lesions are identified. Vascular: Major intracranial vascular flow voids are preserved. Skull and upper cervical spine: No suspicious marrow lesion. Sinuses/Orbits: Unremarkable orbits. Mild mucosal thickening in the paranasal sinuses. Clear mastoid air cells. Other: None. IMPRESSION: 1. Stable to minimally increased size of 2 left cerebral metastases since 08/05/2023. Worsening, extensive vasogenic edema with 7 mm of rightward midline shift. 2. Unchanged size of small right occipital and left cerebellar metastases. 3. No new metastases. Electronically Signed   By: Sebastian Ache M.D.   On: 08/21/2023 10:30   Overnight EEG with video Result Date: 08/21/2023 Charlsie Quest, MD     08/22/2023  7:25 AM Patient Name: Joshua [name] MRN: [id number] Epilepsy Attending: Charlsie Quest Referring Physician/Provider: Erick Blinks, MD Duration: 08/21/2023 0420 to 2304 Patient history: 68 y.o. 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  He was noted to have twitching of his right side concerning for a seizure. EEG to evaluate for seizure Level of alertness: Awake, asleep AEDs during EEG study: LEV Technical aspects:  This EEG study was done with scalp electrodes positioned according to the 10-20 International system of electrode placement. Electrical activity was reviewed with band pass filter of 1-70Hz , sensitivity of 7 uV/mm, display speed of 87mm/sec with a 60Hz  notched filter applied as appropriate. EEG data were recorded continuously and digitally stored.  Video monitoring was available and reviewed as appropriate. Description: The posterior dominant rhythm consists of 9 Hz activity of moderate voltage (25-35 uV) seen predominantly in posterior head regions, symmetric and reactive to eye opening and eye closing. Sleep was characterized by vertex waves, sleep spindles (12 to 14 Hz), maximal frontocentral region. EEG showed intermittent 3 to 5 Hz theta-delta slowing in left temporal region. Hyperventilation and photic stimulation were not performed.  EEG was disconnected between 0906 to 1001 and between 1554 to 1845 ABNORMALITY - Intermittent slow, left temporal region. IMPRESSION: This study is suggestive of cortical dysfunction arising from left temporal region likely secondary to underlying structural abnormality/ tumor. No seizures or epileptiform discharges  were seen throughout the recording. Charlsie Quest   CT HEAD CODE STROKE WO CONTRAST Result Date: 08/20/2023 CLINICAL DATA:  Code stroke.  Neuro deficit, acute, stroke suspected EXAM: CT HEAD WITHOUT CONTRAST TECHNIQUE: Contiguous axial images were obtained from the base of the skull through the vertex without intravenous contrast. RADIATION DOSE REDUCTION: This exam was performed according to the departmental dose-optimization program which includes automated exposure control, adjustment of the mA and/or kV according to patient size and/or use of iterative reconstruction technique. COMPARISON:  None Available. FINDINGS: Motion limited study.  Within this limitation: Brain: Increasing edema in the left frontoparietal region. No definite new acute hemorrhage.  Small focus of hyperdensity in the left frontal region likely represents mineralization or hemorrhage with in the known metastasis, likely present on prior MRI. No obvious superimposed acute large vascular territory infarct. No hydrocephalus. Vascular: Not well assessed. Skull: No acute fracture. Sinuses/Orbits: Clear sinuses.  No acute orbital findings. Other: No mastoid effusions. IMPRESSION: Increasing left frontoparietal edema and mass effect with 9 mm of right midline shift, likely related to worsening metastatic disease given findings on recent MRI head. Repeat MRI head with contrast could confirm and better assess if clinically warranted. Code stroke imaging results were communicated on 08/20/2023 at 11:15 pm to provider [facility] via telephone, who verbally acknowledged these results. Electronically Signed   By: Feliberto Harts M.D.   On: 08/20/2023 23:20   MR BRAIN W WO CONTRAST Result Date: 08/05/2023 CLINICAL DATA:  Metastatic lung cancer, restaging EXAM: MRI HEAD WITHOUT AND WITH CONTRAST TECHNIQUE: Multiplanar, multiecho pulse sequences of the brain and surrounding structures were obtained without and with intravenous contrast. CONTRAST:  9mL GADAVIST GADOBUTROL 1 MMOL/ML IV SOLN COMPARISON:  07/03/2023 FINDINGS: Brain: Redemonstrated peripherally enhancing mass in the medial left postcentral gyrus, which measures up to 2.4 x 2.2 x 1.8 cm (AP x TR x CC) (series 18, image 26 and series 16, image 143), previously 2.0 x 2.1 x 1.8 cm when remeasured similarly. Increased associated T2 hyperintense signal (series 9, image 59). Enhancing mass in the left precentral gyrus measures 1.6 x 1.3 x 1.7 cm (AP x TR x CC) (series 16, image 112 and series 18, image 32), previously 1.5 x 1.3 x 1.8 cm, overall unchanged. Unchanged surrounding T2 hyperintense signal. Minimal peripheral enhancement about the lesion in the right occipital lobe, which measures up to 0.9 x 0.8 x 0.6 cm (AP x TR x CC) (series 16, image 62 and  series 18, image 12), previously 1.0 x 1.1 x 0.8 cm. Decreased surrounding T2 hyperintense signal. Diminishing enhancement in a previously noted left cerebellar lesion (series 16, image 43). No new abnormal enhancement. No acute infarct, hemorrhage, or midline shift. No hydrocephalus or extra-axial collection. Vascular: Normal arterial flow voids. Normal arterial and venous enhancement. Skull and upper cervical spine: Normal marrow signal. Sinuses/Orbits: Clear paranasal sinuses. No acute finding in the orbits. Other: The mastoid air cells are well aerated. IMPRESSION: 1. Slight interval increase in size of the peripherally enhancing mass in the medial left postcentral gyrus, with increased associated T2 hyperintense signal. 2. Unchanged size of the enhancing mass in the left precentral gyrus. 3. Decreased size of the enhancing lesion in the right occipital lobe, with decreased surrounding T2 hyperintense signal. 4. Diminishing enhancement in a previously noted left cerebellar lesion. 5. No new abnormal enhancement. Electronically Signed   By: Wiliam Ke M.D.   On: 08/05/2023 14:50   VAS [place] ABI WITH/WO TBI Result Date: 07/09/2023  LOWER EXTREMITY DOPPLER STUDY Patient Name:  Joshua [name]  Date of Exam:   07/09/2023 Medical Rec #: [id number]            Accession #:    [id number] Date of Birth: 1955-10-15           Patient Gender: M Patient Age:   89 years Exam Location:   [facility] Procedure:      VAS [place] ABI WITH/WO TBI Referring Phys: [facility] --------------------------------------------------------------------------------  Indications: Peripheral artery disease, and Routine f/u. Patient reports no              claudication or lower extremity pain. High Risk Factors: Hyperlipidemia, past history of smoking. Other Factors: ASO s/p multiple interventions and bypass.                11/15/2019 Right thrombectomy of Fempop insitu graft with added                stent throughout.                  12/01/2019: Aortogram and Selective Right Lower Extremity                Angiogram. 6 mg of TPA delivered in a lysis catheterfrom the                Origin of the Right Femoral -Popliteal bypass down the proximal                ATA with placement of the lysis.                 12/02/2019: PTA of the Right Posterior Tibial Artery. PTA of the                Right Peroneal Artery. Mechanical Thrombectomy of the Right                Femoral -Popliteal Bypass graft. Mechanical Thrombectomy of the                Right Tibioperoneal Trunk and Posterior Tibial Artery.                 09/25/2021: Right Lower Extremity Angiography third order                catheter placement. PTA of the proximal peroneal and                tibioperoneal trunk to 2.5 mm with an ultra score balloon. PTA of                the Femoral Artery and the proximal anastomosis of the bypass                graft to 4 mm with an ultra score balloon. Administration of 5 mg                of tpa throughout the entire bypass and in the peroneal Artery.                Initiation of continuous tpa infusion Right lower Extremity for                limb salvage. Insertion of a triple lumen catheter Left CFV with                [place] and fluoroscopic guidance.                 09/26/2021: Right  LE Angiogram. PTA of the Right Posterior Tibial                Artery with 2.5 mm diameter by 30 cm length angioplasty balloon.                PTA of the distal bypass anastomosis and distal bypass graft                analogous to the Popliteal artery with 4 mm diameter by 8 cm                length Lutonix drug coated angioplasty balloon.                04/08/2022 right thrombectomy of the Deep profunda.  Vascular Interventions: Hx of multiple right lower extremity PTA's/stents.                         09/10/2016 Right femoral to below the knee popliteal                         artery bypass graft placement. 5/01 Right bypass graft &                         mid popliteal  artery PTA & coil embolectomy of large                         branch of bypass graft. 12/09/16 Right distal bypass                         graft PTA/stent and right posterior tibial artery PTA.                         03/23/2020 Rt PTA thrombectomy. Rt Profunda fem artery to                         PTA bypass. Performing Technologist: Hardie Lora RVT  Examination Guidelines: A complete evaluation includes at minimum, Doppler waveform signals and systolic blood pressure reading at the level of bilateral brachial, anterior tibial, and posterior tibial arteries, when vessel segments are accessible. Bilateral testing is considered an integral part of a complete examination. Photoelectric Plethysmograph (PPG) waveforms and toe systolic pressure readings are included as required and additional duplex testing as needed. Limited examinations for reoccurring indications may be performed as noted.  ABI Findings: +---------+------------------+-----+----------+--------+ Right    Rt Pressure (mmHg)IndexWaveform  Comment  +---------+------------------+-----+----------+--------+ Brachial 134                                       +---------+------------------+-----+----------+--------+ PTA      59                0.44 monophasic         +---------+------------------+-----+----------+--------+ DP       47                0.35 monophasic         +---------+------------------+-----+----------+--------+ Great Toe32                0.24                    +---------+------------------+-----+----------+--------+ +---------+------------------+-----+---------+-------+  Left     Lt Pressure (mmHg)IndexWaveform Comment +---------+------------------+-----+---------+-------+ Brachial 131                                     +---------+------------------+-----+---------+-------+ PTA      151               1.13 triphasic        +---------+------------------+-----+---------+-------+ DP       148                1.10 triphasic        +---------+------------------+-----+---------+-------+ Great Toe82                0.61                  +---------+------------------+-----+---------+-------+ +-------+-----------+-----------+------------+------------+ ABI/TBIToday's ABIToday's TBIPrevious ABIPrevious TBI +-------+-----------+-----------+------------+------------+ Right  0.44       0.24       0.45        0.43         +-------+-----------+-----------+------------+------------+ Left   1.13       0.61       1.03        1.02         +-------+-----------+-----------+------------+------------+  Bilateral ABIs appear essentially unchanged compared to prior study on 01/05/2023.  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 The right toe-brachial index is abnormal. Left: Resting left ankle-brachial index is within normal range. The left toe-brachial index is abnormal. *See table(s) above for measurements and observations.  Electronically signed by Levora Dredge MD on 07/09/2023 at 4:17:26 PM.    Final    MR BRAIN W WO CONTRAST Result Date: 07/03/2023 CLINICAL DATA:  Brain/CNS neoplasm, assess treatment response. Metastatic lung cancer, restaging. EXAM: MRI HEAD WITHOUT AND WITH CONTRAST TECHNIQUE: Multiplanar, multiecho pulse sequences of the brain and surrounding structures were obtained without and with intravenous contrast. CONTRAST:  9mL GADAVIST GADOBUTROL 1 MMOL/ML IV SOLN COMPARISON:  MRI brain 04/16/2023. FINDINGS: Brain: Continued increase in size of the peripherally enhancing mass centered within the medial aspect of the left postcentral gyrus, now measuring up to 22 mm (coronal image 14 series 17), previously 17 mm. Associated increase in surrounding vasogenic edema. Overall unchanged size but decreased central necrotic component of the 17 mm lesion in the lateral aspect of the left precentral gyrus (coronal image 24  series 17) with associated increase in surrounding vasogenic edema. Unchanged peripherally enhancing lesion in the right occipital lobe (axial image 65 series 16). Slightly decreased punctate enhancing lesion in anterior aspect of the left cerebellar hemisphere (axial image 44 series 16). No acute infarct or hemorrhage. No hydrocephalus or extra-axial collection. No midline shift. Vascular: Normal flow voids and vessel enhancement. Skull and upper cervical spine: Normal marrow signal and enhancement. Sinuses/Orbits: No acute findings. Other: None. IMPRESSION: 1. Continued increase in size of the peripherally enhancing mass centered within the medial aspect of the left postcentral gyrus, now measuring up to 22 mm, previously 17 mm. Associated increase in surrounding vasogenic edema. 2. Overall unchanged size but decreased central necrotic component of the 17 mm lesion in the lateral aspect of the left precentral gyrus with associated increase in surrounding vasogenic edema. 3. Unchanged peripherally enhancing lesion in the right occipital lobe. Slightly decreased punctate enhancing lesion in the anterior aspect of the left cerebellar hemisphere. Electronically Signed   By: Elwyn Reach.D.  On: 07/03/2023 13:47   CT CHEST ABDOMEN PELVIS W CONTRAST Result Date: 06/29/2023 CLINICAL DATA:  Lung cancer restaging * Tracking Code: BO * EXAM: CT CHEST, ABDOMEN, AND PELVIS WITH CONTRAST TECHNIQUE: Multidetector CT imaging of the chest, abdomen and pelvis was performed following the standard protocol during bolus administration of intravenous contrast. RADIATION DOSE REDUCTION: This exam was performed according to the departmental dose-optimization program which includes automated exposure control, adjustment of the mA and/or kV according to patient size and/or use of iterative reconstruction technique. CONTRAST:  OMNIPAQUE IOHEXOL 300 MG/ML  SOLN COMPARISON:  03/12/2023 FINDINGS: CT CHEST FINDINGS  Cardiovascular: Right chest port catheter. Normal heart size. Left and right coronary artery calcifications. No pericardial effusion. Mediastinum/Nodes: No enlarged mediastinal, hilar, or axillary lymph nodes. Thyroid gland, trachea, and esophagus demonstrate no significant findings. Lungs/Pleura: Minimal paraseptal emphysema. Unchanged post treatment/post radiation appearance of the right lung, with perihilar and suprahilar paramedian fibrosis and consolidation (series 4, image 59). No pleural effusion or pneumothorax. Musculoskeletal: No chest wall abnormality. No acute osseous findings. CT ABDOMEN PELVIS FINDINGS Hepatobiliary: No solid liver abnormality is seen. Hepatic steatosis. No gallstones, gallbladder wall thickening, or biliary dilatation. Pancreas: Unremarkable. No pancreatic ductal dilatation or surrounding inflammatory changes. Spleen: Normal in size without significant abnormality. Adrenals/Urinary Tract: Adrenal glands are unremarkable. Kidneys are normal, without renal calculi, solid lesion, or hydronephrosis. Bladder is unremarkable. Stomach/Bowel: Stomach is within normal limits. Appendix appears normal. No evidence of bowel wall thickening, distention, or inflammatory changes. Sigmoid diverticulosis. Vascular/Lymphatic: Aortic atherosclerosis. No enlarged abdominal or pelvic lymph nodes. Reproductive: No mass or other abnormality. Other: No abdominal wall hernia or abnormality. No ascites. Musculoskeletal: No acute osseous findings. IMPRESSION: 1. Unchanged post treatment/post radiation appearance of the right lung, with perihilar and suprahilar paramedian fibrosis and consolidation. 2. No evidence of lymphadenopathy or metastatic disease in the chest, abdomen, or pelvis. 3. Hepatic steatosis. 4. Coronary artery disease. Aortic Atherosclerosis (ICD10-I70.0) and Emphysema (ICD10-J43.9). Electronically Signed   By: Jearld Lesch M.D.   On: 06/29/2023 07:39

## 2023-09-09 NOTE — Assessment & Plan Note (Signed)
Durvalumab treatment as planned.  

## 2023-09-09 NOTE — Patient Instructions (Signed)
 [facility] - A DEPT OF [facility]  Discharge Instructions: Thank you for choosing [facility] to provide your oncology and hematology care.  If you have a lab appointment with the Cancer Center, please go directly to the Cancer Center and check in at the registration area.  Wear comfortable clothing and clothing appropriate for easy access to any Portacath or PICC line.   We strive to give you quality time with your provider. You may need to reschedule your appointment if you arrive late (15 or more minutes).  Arriving late affects you and other patients whose appointments are after yours.  Also, if you miss three or more appointments without notifying the office, you may be dismissed from the clinic at the provider's discretion.      For prescription refill requests, have your pharmacy contact our office and allow 72 hours for refills to be completed.    Today you received the following chemotherapy and/or immunotherapy agents IMFINZI      To help prevent nausea and vomiting after your treatment, we encourage you to take your nausea medication as directed.  BELOW ARE SYMPTOMS THAT SHOULD BE REPORTED IMMEDIATELY: *FEVER GREATER THAN 100.4 F (38 C) OR HIGHER *CHILLS OR SWEATING *NAUSEA AND VOMITING THAT IS NOT CONTROLLED WITH YOUR NAUSEA MEDICATION *UNUSUAL SHORTNESS OF BREATH *UNUSUAL BRUISING OR BLEEDING *URINARY PROBLEMS (pain or burning when urinating, or frequent urination) *BOWEL PROBLEMS (unusual diarrhea, constipation, pain near the anus) TENDERNESS IN MOUTH AND THROAT WITH OR WITHOUT PRESENCE OF ULCERS (sore throat, sores in mouth, or a toothache) UNUSUAL RASH, SWELLING OR PAIN  UNUSUAL VAGINAL DISCHARGE OR ITCHING   Items with * indicate a potential emergency and should be followed up as soon as possible or go to the Emergency Department if any problems should occur.  Please show the CHEMOTHERAPY ALERT CARD or IMMUNOTHERAPY  ALERT CARD at check-in to the Emergency Department and triage nurse.  Should you have questions after your visit or need to cancel or reschedule your appointment, please contact [facility] - A DEPT OF [facility]  [phone] and follow the prompts.  Office hours are 8:00 a.m. to 4:30 p.m. Monday - Friday. Please note that voicemails left after 4:00 p.m. may not be returned until the following business day.  We are closed weekends and major holidays. You have access to a nurse at all times for urgent questions. Please call the main number to the clinic [phone] and follow the prompts.  For any non-urgent questions, you may also contact your provider using MyChart. We now offer e-Visits for anyone 30 and older to request care online for non-urgent symptoms. For details visit [email or web address].   Also download the MyChart app! Go to the app store, search "MyChart", open the app, select [facility], and log in with your MyChart username and password.  Durvalumab Injection What is this medication? DURVALUMAB (dur VAL ue mab) treats some types of cancer. It works by helping your immune system slow or stop the spread of cancer cells. It is a monoclonal antibody. This medicine may be used for other purposes; ask your health care provider or pharmacist if you have questions. COMMON BRAND NAME(S): IMFINZI What should I tell my care team before I take this medication? They need to know if you have any of these conditions: Allogeneic stem cell transplant (uses someone else's stem cells) Autoimmune diseases, such as Crohn disease, ulcerative  colitis, lupus History of chest radiation Nervous system problems, such as Guillain-Barre syndrome, myasthenia gravis Organ transplant An unusual or allergic reaction to durvalumab, other medications, foods, dyes, or preservatives Pregnant or trying to get pregnant Breast-feeding How should I use this medication? This  medication is infused into a vein. It is given by your care team in a hospital or clinic setting. A special MedGuide will be given to you before each treatment. Be sure to read this information carefully each time. Talk to your care team about the use of this medication in children. Special care may be needed. Overdosage: If you think you have taken too much of this medicine contact a poison control center or emergency room at once. NOTE: This medicine is only for you. Do not share this medicine with others. What if I miss a dose? Keep appointments for follow-up doses. It is important not to miss your dose. Call your care team if you are unable to keep an appointment. What may interact with this medication? Interactions have not been studied. This list may not describe all possible interactions. Give your health care provider a list of all the medicines, herbs, non-prescription drugs, or dietary supplements you use. Also tell them if you smoke, drink alcohol, or use illegal drugs. Some items may interact with your medicine. What should I watch for while using this medication? Your condition will be monitored carefully while you are receiving this medication. You may need blood work while taking this medication. This medication may cause serious skin reactions. They can happen weeks to months after starting the medication. Contact your care team right away if you notice fevers or flu-like symptoms with a rash. The rash may be red or purple and then turn into blisters or peeling of the skin. You may also notice a red rash with swelling of the face, lips, or lymph nodes in your neck or under your arms. Tell your care team right away if you have any change in your eyesight. Talk to your care team if you may be pregnant. Serious birth defects can occur if you take this medication during pregnancy and for 3 months after the last dose. You will need a negative pregnancy test before starting this medication.  Contraception is recommended while taking this medication and for 3 months after the last dose. Your care team can help you find the option that works for you. Do not breastfeed while taking this medication and for 3 months after the last dose. What side effects may I notice from receiving this medication? Side effects that you should report to your care team as soon as possible: Allergic reactions--skin rash, itching, hives, swelling of the face, lips, tongue, or throat Dry cough, shortness of breath or trouble breathing Eye pain, redness, irritation, or discharge with blurry or decreased vision Heart muscle inflammation--unusual weakness or fatigue, shortness of breath, chest pain, fast or irregular heartbeat, dizziness, swelling of the ankles, feet, or hands Hormone gland problems--headache, sensitivity to light, unusual weakness or fatigue, dizziness, fast or irregular heartbeat, increased sensitivity to cold or heat, excessive sweating, constipation, hair loss, increased thirst or amount of urine, tremors or shaking, irritability Infusion reactions--chest pain, shortness of breath or trouble breathing, feeling faint or lightheaded Kidney injury (glomerulonephritis)--decrease in the amount of urine, red or dark brown urine, foamy or bubbly urine, swelling of the ankles, hands, or feet Liver injury--right upper belly pain, loss of appetite, nausea, light-colored stool, dark yellow or brown urine, yellowing skin or eyes,  unusual weakness or fatigue Pain, tingling, or numbness in the hands or feet, muscle weakness, change in vision, confusion or trouble speaking, loss of balance or coordination, trouble walking, seizures Rash, fever, and swollen lymph nodes Redness, blistering, peeling, or loosening of the skin, including inside the mouth Sudden or severe stomach pain, bloody diarrhea, fever, nausea, vomiting Side effects that usually do not require medical attention (report these to your care team  if they continue or are bothersome): Bone, joint, or muscle pain Diarrhea Fatigue Loss of appetite Nausea Skin rash This list may not describe all possible side effects. Call your doctor for medical advice about side effects. You may report side effects to FDA at 1-800-FDA-1088. Where should I keep my medication? This medication is given in a hospital or clinic. It will not be stored at home. NOTE: This sheet is a summary. It may not cover all possible information. If you have questions about this medicine, talk to your doctor, pharmacist, or health care provider.  2024 [employer]vier/Gold Standard (2021-11-19 00:00:00)

## 2023-09-09 NOTE — Assessment & Plan Note (Addendum)
Follow up with Dr. Barbaraann Cao.  Continue keppra,lamotrigine. and  dexamethasone 4mg  daily.

## 2023-09-10 ENCOUNTER — Ambulatory Visit: Payer: HMO

## 2023-09-10 ENCOUNTER — Other Ambulatory Visit: Payer: HMO

## 2023-09-10 ENCOUNTER — Ambulatory Visit: Payer: HMO | Admitting: Oncology

## 2023-09-11 ENCOUNTER — Encounter: Payer: Medicare Other | Admitting: Family Medicine

## 2023-09-22 ENCOUNTER — Other Ambulatory Visit: Payer: Self-pay | Admitting: Internal Medicine

## 2023-09-22 ENCOUNTER — Other Ambulatory Visit: Payer: Self-pay

## 2023-09-22 ENCOUNTER — Telehealth: Payer: Self-pay | Admitting: *Deleted

## 2023-09-22 MED ORDER — LEVETIRACETAM 1000 MG PO TABS
1000.0000 mg | ORAL_TABLET | Freq: Two times a day (BID) | ORAL | 2 refills | Status: DC
  Filled 2023-09-22: qty 60, 30d supply, fill #0
  Filled 2023-10-26: qty 60, 30d supply, fill #1
  Filled 2023-11-24: qty 60, 30d supply, fill #2

## 2023-09-22 NOTE — Telephone Encounter (Signed)
 Received PC from patient - he states he had episode this morning, he couldn't get out of bed, his R arm was very weak, couldn't support his weight, arm was also numb but shaking.  This lasted 5-10 minutes.  He was able to sit on side of bed & arm feels normal now.  He is currently taking lamictal 100 mg twice a day & has DC'd Keppra on Dr Liana Gerold recommendation.  He has tapered his decadron - is now taking 1 mg daily & will finish in two days.  Patient states he would prefer to go back to taking Keppra as before & stop the Lamictal.  Dr Barbaraann Cao informed - he states patient may DC lamictal, take Keppra 1000 mg twice daily & continue his steroid taper as he has been.  Patient informed, he verbalizes understanding.

## 2023-09-23 ENCOUNTER — Inpatient Hospital Stay (HOSPITAL_BASED_OUTPATIENT_CLINIC_OR_DEPARTMENT_OTHER): Payer: HMO | Admitting: Oncology

## 2023-09-23 ENCOUNTER — Inpatient Hospital Stay: Payer: HMO

## 2023-09-23 ENCOUNTER — Inpatient Hospital Stay: Payer: HMO | Attending: Oncology

## 2023-09-23 ENCOUNTER — Encounter: Payer: Self-pay | Admitting: Oncology

## 2023-09-23 VITALS — BP 147/76 | HR 72 | Temp 98.4°F | Resp 24 | Wt 217.6 lb

## 2023-09-23 VITALS — BP 128/63 | HR 76 | Temp 97.7°F | Resp 18

## 2023-09-23 DIAGNOSIS — C3491 Malignant neoplasm of unspecified part of right bronchus or lung: Secondary | ICD-10-CM | POA: Diagnosis not present

## 2023-09-23 DIAGNOSIS — E032 Hypothyroidism due to medicaments and other exogenous substances: Secondary | ICD-10-CM

## 2023-09-23 DIAGNOSIS — R479 Unspecified speech disturbances: Secondary | ICD-10-CM | POA: Insufficient documentation

## 2023-09-23 DIAGNOSIS — Z79890 Hormone replacement therapy (postmenopausal): Secondary | ICD-10-CM | POA: Diagnosis not present

## 2023-09-23 DIAGNOSIS — C7931 Secondary malignant neoplasm of brain: Secondary | ICD-10-CM | POA: Diagnosis not present

## 2023-09-23 DIAGNOSIS — Z5112 Encounter for antineoplastic immunotherapy: Secondary | ICD-10-CM

## 2023-09-23 DIAGNOSIS — Z79899 Other long term (current) drug therapy: Secondary | ICD-10-CM | POA: Diagnosis not present

## 2023-09-23 DIAGNOSIS — Z7952 Long term (current) use of systemic steroids: Secondary | ICD-10-CM | POA: Diagnosis not present

## 2023-09-23 DIAGNOSIS — C3411 Malignant neoplasm of upper lobe, right bronchus or lung: Secondary | ICD-10-CM | POA: Insufficient documentation

## 2023-09-23 DIAGNOSIS — R569 Unspecified convulsions: Secondary | ICD-10-CM | POA: Insufficient documentation

## 2023-09-23 LAB — CBC WITH DIFFERENTIAL/PLATELET
Abs Immature Granulocytes: 0.02 10*3/uL (ref 0.00–0.07)
Basophils Absolute: 0.1 10*3/uL (ref 0.0–0.1)
Basophils Relative: 1 %
Eosinophils Absolute: 0.2 10*3/uL (ref 0.0–0.5)
Eosinophils Relative: 3 %
HCT: 40.2 % (ref 39.0–52.0)
Hemoglobin: 13.1 g/dL (ref 13.0–17.0)
Immature Granulocytes: 0 %
Lymphocytes Relative: 19 %
Lymphs Abs: 1.2 10*3/uL (ref 0.7–4.0)
MCH: 27.9 pg (ref 26.0–34.0)
MCHC: 32.6 g/dL (ref 30.0–36.0)
MCV: 85.5 fL (ref 80.0–100.0)
Monocytes Absolute: 0.6 10*3/uL (ref 0.1–1.0)
Monocytes Relative: 10 %
Neutro Abs: 4 10*3/uL (ref 1.7–7.7)
Neutrophils Relative %: 67 %
Platelets: 145 10*3/uL — ABNORMAL LOW (ref 150–400)
RBC: 4.7 MIL/uL (ref 4.22–5.81)
RDW: 16 % — ABNORMAL HIGH (ref 11.5–15.5)
WBC: 6 10*3/uL (ref 4.0–10.5)
nRBC: 0 % (ref 0.0–0.2)

## 2023-09-23 LAB — COMPREHENSIVE METABOLIC PANEL
ALT: 33 U/L (ref 0–44)
AST: 27 U/L (ref 15–41)
Albumin: 3.7 g/dL (ref 3.5–5.0)
Alkaline Phosphatase: 75 U/L (ref 38–126)
Anion gap: 7 (ref 5–15)
BUN: 20 mg/dL (ref 8–23)
CO2: 28 mmol/L (ref 22–32)
Calcium: 9 mg/dL (ref 8.9–10.3)
Chloride: 101 mmol/L (ref 98–111)
Creatinine, Ser: 0.94 mg/dL (ref 0.61–1.24)
GFR, Estimated: >60 mL/min (ref >60)
Glucose, Bld: 117 mg/dL — ABNORMAL HIGH (ref 70–99)
Potassium: 3.5 mmol/L (ref 3.5–5.1)
Sodium: 136 mmol/L (ref 135–145)
Total Bilirubin: 0.6 mg/dL (ref 0.0–1.2)
Total Protein: 6.7 g/dL (ref 6.5–8.1)

## 2023-09-23 MED ORDER — SODIUM CHLORIDE 0.9 % IV SOLN
Freq: Once | INTRAVENOUS | Status: AC
Start: 2023-09-23 — End: 2023-09-23
  Filled 2023-09-23: qty 250

## 2023-09-23 MED ORDER — SODIUM CHLORIDE 0.9 % IV SOLN
10.0000 mg/kg | Freq: Once | INTRAVENOUS | Status: AC
  Administered 2023-09-23: 1000 mg via INTRAVENOUS
  Filled 2023-09-23: qty 20

## 2023-09-23 MED ORDER — HEPARIN SOD (PORK) LOCK FLUSH 100 UNIT/ML IV SOLN
500.0000 [IU] | Freq: Once | INTRAVENOUS | Status: AC | PRN
  Administered 2023-09-23: 500 [IU]
  Filled 2023-09-23: qty 5

## 2023-09-23 MED ORDER — SODIUM CHLORIDE 0.9% FLUSH
10.0000 mL | INTRAVENOUS | Status: DC | PRN
  Administered 2023-09-23: 10 mL via INTRAVENOUS
  Filled 2023-09-23: qty 10

## 2023-09-23 NOTE — Assessment & Plan Note (Signed)
Durvalumab treatment as planned.  

## 2023-09-23 NOTE — Assessment & Plan Note (Signed)
 continue levothyroxine 125 mcg daily

## 2023-09-23 NOTE — Progress Notes (Signed)
 Hematology/Oncology Progress note [phone] [phone]    CHIEF COMPLAINTS/REASON FOR VISIT:  Follow up for lung cancer   ASSESSMENT & PLAN:   Cancer Staging  Squamous cell carcinoma of right lung (HCC) Staging form: Lung, AJCC 8th Edition - Clinical stage from 09/20/2020: Stage IV (cT1b, cN2, cM1) - Signed by Joshua Patience, MD on 12/13/2021   Squamous cell carcinoma of right lung (HCC) 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  Labs are reviewed and discussed with patient. Proceed with  Durvalumab.   Encounter for antineoplastic immunotherapy Durvalumab treatment as planned.   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 Barbaraann [name].  Continue keppra and tapering course of  dexamethasone as advised by Dr. Barbaraann [name].      Follow up 2 weeks lab MD Durvalumab All questions were answered. The patient knows to call the clinic with any problems, questions or concerns.  Joshua Patience, MD, PhD [facility] Hematology Oncology 09/23/2023       HISTORY OF PRESENTING ILLNESS:  Joshua [name] is a 68 y.o. male who has above history reviewed by me today presents for follow up visit for Stage IV lung squamous cell carcinoma. Oncology History  Squamous cell carcinoma of right lung (HCC)  09/18/2020 Imaging   PET scan showed right upper lobe nodule maximal SUV 11.5.  Hypermetabolic cluster right paratracheal adenopathy with maximum SUV up to 12.  No findings of metastatic disease to the neck/abdomen/pelvis or skeleton.   09/20/2020 Initial Diagnosis   Squamous cell carcinoma of right lung (HCC) NGS showed PD-L1 TPS 95%, LRP1B S979fs, PIK3CA E545K, RB1 c1422-2A>T, RIT1 T83_ A84del, STK11 P237fs, TPS53 R248W, TMB 19.71mut/mb, MS stable.    09/20/2020 Cancer Staging   Staging form: Lung, AJCC 8th Edition - Clinical stage from 09/20/2020: Stage IV (cT1b,  cN2, cM1) - Signed by Joshua Patience, MD on 12/13/2021   10/03/2020 Imaging   MRI brain with and without contrast showed no evidence of intracranial metastatic disease. Well-defined round lesion within the left parotid tail measuring approximately 2.3 x 1.8 x 1.8 cm   10/09/2020 - 11/27/2020 Radiation Therapy   concurrent chemoradiation   10/10/2020 - 10/31/2020 Chemotherapy   10/10/2020 concurrent chemoradiation.  carboplatin AUC of 2 and Taxol 45 mg/m2 x 4.  Additional chemotherapy was held due to thrombocytopenia, neutropenia       12/31/2020 - 03/21/2022 Chemotherapy   Maintenance  Durvalumab q14d      12/31/2020 -  Chemotherapy   Patient is on Treatment Plan : LUNG Durvalumab (10) q14d     03/28/2021 Imaging   CT without contrast showed new linear right perihilar consolidation with associated traction bronchiectasis.  Likely postradiation changes.  Additional new peribronchial vascular/nodular groundglass opacities with areas of consolidation or seen primarily in the right upper and lower lobes, more distant from expected radiation fields. 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  Per my discussion with pulmonology Dr. 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     05/09/2021 Imaging   Repeat CT chest without contrast showed evolving radiation changes in the right perihilar region likely accounting for interval increase to thickening of the posterior wall of the right upper lobe bronchus.  The more peripheral right lung groundglass opacities seen on most recent study have generally improved with exception of  one peripherally right upper lobe which appears new.  These are likely inflammatory.  No findings highly suspicious for local recurrence.   05/30/2021 - 01/10/2022 Chemotherapy   Durvalumab q14d     10/07/2021 Imaging   MRI brain with and without contrast showed  2 contrast-enhancing lesions, most consistent with metastatic disease.  Largest lesion is in the left frontal lobe and measures up to 1.3 cm with a large amount of surrounding edema and 9 mm rightward midline shift.  The other enhancing lesion was 4 mm in the right occipital lobe with a small amount of surrounding edema.  No acute or chronic hemorrhage.   10/07/2021 Progression   Headache after colonoscopy, progressive difficulty using her right arm.  Progressively worsening of speech. Stage IV lung cancer with brain metastasis.  started on Dexamethasone.  He was seen by neurosurgery and radiation oncology.  Recommendation is to proceed with SRS.  Patient was discharged with dexamethasone   10/16/2021 Imaging   PET scan showed postradiation changes in the right supra hilar lung without evidence of local recurrence.  No evidence of lung cancer recurrence or metastatic disease.  Single focus of intense metabolic activity associated with posterior left 11th rib likely secondary to recent fall/posttraumatic metabolic changes.  Frontal  uptake in the left frontal lobe corresponding to the known metastatic lesion in the brain   10/29/2021 -  Radiation Therapy   Brain Radiation [facility]   12/10/2021 Imaging   Brain MRI: Continued interval decrease in size of a left frontal lobe metastasis, now measuring 10 mm. Moderate surrounding edema has also decreased.  3-4 mm metastasis within the right occipital lobe, not significantly changed in size. There is now a small focus of central non enhancement within this lesion. Increased edema. 3 mm enhancing metastasis within the high left parietal lobe. In retrospect, this was likely present as a subtle punctate focus of enhancement on the prior brain MRI of 10/22/2021.   01/18/2022 Imaging   CT chest abdomen pelvis w contrast 1. Stable post treatment changes in the right upper lobe medially with dense radiation fibrosis but no findings to suggest local recurrent tumor. 2.  Significant progression of ill-defined ground-glass opacity, interstitial thickening and airspace nodularity in the right upper lobe and upper aspect of the right lower lobe. Findings could reflect an inflammatory or atypical infectious process, drug induced pneumonitis or interstitial spread of tumor.3. No mediastinal or hilar mass or adenopathy. 4. No findings for abdominal/pelvic metastatic disease or osseous metastatic disease.   02/19/2022 Imaging   MRI brain w wo contrast showed Three metastatic deposits in the brain appear smaller. Decreased edema in the left parietal lobe. No new lesions    04/04/2022 Imaging   CT chest w contrast 1. Stable radiation changes in the right perihilar region without evidence of local recurrence. 2. Interval improvement in the patchy ground-glass opacities previously demonstrated in both lungs, most consistent with a resolving infectious or inflammatory process (including immunotherapy-related pneumonitis). 3. No evidence of metastatic disease.   05/14/2022 Miscellaneous   He had a mechanical fall and fractured his left elbow. xrays showed a left elbow fracture  05/26/22 seen by orthopedic surgeon, cast applied.    05/27/2022 Imaging   MRI brain w wo contrast 1. All three known brain metastases have enlarged since August, with increasing regional edema. And there is a small new left anterior cerebellar 4-5 mm metastasis (was questionably punctate on the May MRI). This constellation could reflect a combination of True Progression (Left Cerebellar  lesion) and pseudoprogression/radionecrosis (3 existing mets).    2. No significant intracranial mass effect. 3. Underlying moderate cerebral white matter signal changes.   05/30/2022 Imaging   CT chest abdomen pelvis w contrast  1. 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 There is a stable  small right pleural effusion, but no definitive findings to suggest residual or recurrent disease on today's examination. 2. No signs of metastatic disease in the abdomen or pelvis.  3. Aortic atherosclerosis, in addition to two-vessel coronary artery disease. Please note that although the presence of coronary artery calcium documents the presence of coronary artery disease, the severity of this disease and any potential stenosis cannot be assessed on this non-gated CT examination. Assessment for potential risk factor modification, dietary therapy or pharmacologic therapy may be warranted, if clinically indicated. 4. Hepatic steatosis. 5. Colonic diverticulosis without evidence of acute diverticulitis at this time. 6. Incompletely imaged but apparently occluded vascular graft in the superior aspect of the right anterior thigh. Referral to vascular specialist for further clinical evaluation should be considered if clinically appropriate. 7. Additional incidental findings, as above.   07/22/2022 Imaging   MRI brain  1. No new or enlarging lesions. No intracranial disease progression. 2. Decreased size of treated lesions of the left frontoparietal junction and left postcentral gyrus. 3. Unchanged lesions in the left cerebellum and right occipital lobe.   10/15/2022 Imaging   MRI brain w wo contrast showed Interval increase in size of multiple intraparenchymal lesions with worsened surrounding vasogenic edema. 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 No new lesions   11/05/2022 Imaging   MRI brain w wo contrast  1. Likely slight increase and right occipital and left cerebellar lesions. Otherwise, unchanged metastases, as above. No new lesions identified. 2. Partially imaged large probable lymph node in the left upper neck, apparent on prior PET CT.   11/13/2022 -  Radiation Therapy   Each of the sites were treated with  SRS/SBRT/SRT-IMRT technique to 20 Gy in 1 fraction to each of the following sites: PTV_2_LCerebel_79mm PTV_3_RCerebel_81mm PTV_4_ROccip_66mm PTV_5_LParietal_16mm   11/27/2022 Imaging   CT chest abdomen pelvis w contrast showed  Improving bilateral scattered interstitial and ground-glass opacities. Small amount of residual areas in the extreme left lower lobe inferiorly. This specific area is slightly increased from previous but overall the amount of opacities is significantly improved. Recommend continued follow-up. No developing new mass lesion, fluid collection or lymph node enlargement. Stable posttreatment changes right perihilar in the lung. Fatty liver infiltration. Colonic diverticulosis. Improved tiny right pleural effusion   02/05/2023 Imaging   MRI brain with and without contrast Interval decrease in all of the metastatic lesions, detailed above.No new lesions identified.    Imaging   CT chest abdomen pelvis w contrast showed  1. 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 2. Trace right pleural fluid or thickening, new or minimally increased. 3. Incidental findings, including: Aortic atherosclerosis (ICD10-I70.0), coronary artery atherosclerosis and emphysema (ICD10-J43.9). Hepatic steatosis.   06/25/2023 Imaging   CT chest abdomen pelvis w contrast showed 1. Unchanged post treatment/post radiation appearance of the right lung, with perihilar and suprahilar paramedian fibrosis and consolidation. 2. No evidence of lymphadenopathy or metastatic disease in the chest, abdomen, or pelvis. 3. Hepatic steatosis. 4. Coronary artery disease.   Aortic Atherosclerosis (ICD10-I70.0) and Emphysema (ICD10-J43.9).   08/26/2023 -  Hospital Admission   hospitalization due to was observed speech and jerking movements on right side despite being on dexamethasone. Continuous EEG suggest cortical dysfunction arising from left  temporal region.  No seizure or elective form discharges were seen. Patient was discharged on dexamethasone 4 mg, Keppra, lamotrigine.     Today he feels well.Intermittent headache, is better. denies any focal deficits. chronic SOB,not worse.  He had an episode of right arm weakness, numbness and shaking, episode lasted 5-10 minutes. He called Dr. Liana Gerold office and was advised to resume Keppra, stop lamictal and continue steroid taper.  No additional episodes. Right arm weakness resolved.    Review of Systems  Constitutional:  Positive for fatigue. Negative for appetite change, chills, fever and unexpected weight change.  HENT:   Negative for hearing loss and voice change.   Eyes:  Negative for eye problems and icterus.  Respiratory:  Negative for chest tightness, cough and shortness of breath.   Cardiovascular:  Negative for chest pain and leg swelling.  Gastrointestinal:  Negative for abdominal distention and abdominal pain.  Endocrine: Negative for hot flashes.  Genitourinary:  Negative for difficulty urinating, dysuria and frequency.   Musculoskeletal:  Negative for arthralgias.  Skin:  Negative for itching and rash.  Neurological:  Negative for extremity weakness, headaches, light-headedness, numbness and speech difficulty.  Hematological:  Negative for adenopathy. Does not bruise/bleed easily.  Psychiatric/Behavioral:  Negative for confusion.       MEDICAL HISTORY:  Past Medical History:  Diagnosis Date   Allergy    Cancer (HCC)    lung   COPD (chronic obstructive pulmonary disease) (HCC)    Dyspnea    former smoker. 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   Malignant neoplasm metastatic to brain [facility])    Peripheral vascular disease (HCC)    Pre-diabetes    Sleep apnea     SURGICAL HISTORY: Past Surgical History:  Procedure Laterality Date   ANTERIOR CERVICAL DECOMP/DISCECTOMY FUSION N/A 03/14/2020    Procedure: ANTERIOR CERVICAL DECOMPRESSION/DISCECTOMY FUSION 3 LEVELS C4-7;  Surgeon: Venetia Night, MD;  Location: [facility] ORS;  Service: Neurosurgery;  Laterality: N/A;   BACK SURGERY  2011   CENTRAL LINE INSERTION Right 09/10/2016   Procedure: CENTRAL LINE INSERTION;  Surgeon: Renford Dills, MD;  Location: [facility] ORS;  Service: Vascular;  Laterality: Right;   CERVICAL FUSION     C4-c7 on Mar 14, 2020   COLONOSCOPY  2013 ?   COLONOSCOPY N/A 09/09/2021   Procedure: COLONOSCOPY;  Surgeon: Jaynie Collins, DO;  Location: [facility] ENDOSCOPY;  Service: Gastroenterology;  Laterality: N/A;   CORONARY ANGIOPLASTY     ESOPHAGOGASTRODUODENOSCOPY N/A 09/09/2021   Procedure: ESOPHAGOGASTRODUODENOSCOPY (EGD);  Surgeon: Jaynie Collins, DO;  Location: [facility] ENDOSCOPY;  Service: Gastroenterology;  Laterality: N/A;   FEMORAL-POPLITEAL BYPASS GRAFT Right 09/10/2016   Procedure: BYPASS GRAFT FEMORAL-POPLITEAL ARTERY ( BELOW KNEE );  Surgeon: Renford Dills, MD;  Location: [facility] ORS;  Service: Vascular;  Laterality: Right;   FEMORAL-TIBIAL BYPASS GRAFT Right 03/23/2020   Procedure: BYPASS GRAFT RIGHT FEMORAL- DISTAL TIBIAL ARTERY WITH DISTAL FLOW GRAFT;  Surgeon: Renford Dills, MD;  Location: [facility] ORS;  Service: Vascular;  Laterality: Right;   LEFT HEART CATH AND CORONARY ANGIOGRAPHY Left 05/16/2021   Procedure: LEFT HEART CATH AND CORONARY ANGIOGRAPHY;  Surgeon: Armando Reichert, MD;  Location: [facility] INVASIVE CV LAB;  Service: Cardiovascular;  Laterality: Left;  LOWER EXTREMITY ANGIOGRAPHY Right 11/18/2016   Procedure: Lower Extremity Angiography;  Surgeon: Renford Dills, MD;  Location: [facility] INVASIVE CV LAB;  Service: Cardiovascular;  Laterality: Right;   LOWER EXTREMITY ANGIOGRAPHY Right 12/09/2016   Procedure: Lower Extremity Angiography;  Surgeon: Renford Dills, MD;  Location: [facility] INVASIVE CV LAB;  Service: Cardiovascular;  Laterality: Right;   LOWER EXTREMITY ANGIOGRAPHY Right  11/15/2019   Procedure: LOWER EXTREMITY ANGIOGRAPHY;  Surgeon: Renford Dills, MD;  Location: [facility] INVASIVE CV LAB;  Service: Cardiovascular;  Laterality: Right;   LOWER EXTREMITY ANGIOGRAPHY Right 12/01/2019   Procedure: Lower Extremity Angiography;  Surgeon: Annice Needy, MD;  Location: [facility] INVASIVE CV LAB;  Service: Cardiovascular;  Laterality: Right;   LOWER EXTREMITY ANGIOGRAPHY Right 12/02/2019   Procedure: LOWER EXTREMITY ANGIOGRAPHY;  Surgeon: Renford Dills, MD;  Location: [facility] INVASIVE CV LAB;  Service: Cardiovascular;  Laterality: Right;   LOWER EXTREMITY ANGIOGRAPHY Right 03/23/2020   Procedure: Lower Extremity Angiography;  Surgeon: Renford Dills, MD;  Location: [facility] INVASIVE CV LAB;  Service: Cardiovascular;  Laterality: Right;   LOWER EXTREMITY ANGIOGRAPHY Right 09/25/2021   Procedure: Lower Extremity Angiography;  Surgeon: Renford Dills, MD;  Location: [facility] INVASIVE CV LAB;  Service: Cardiovascular;  Laterality: Right;   LOWER EXTREMITY ANGIOGRAPHY Right 09/26/2021   Procedure: Lower Extremity Angiography;  Surgeon: Annice Needy, MD;  Location: [facility] INVASIVE CV LAB;  Service: Cardiovascular;  Laterality: Right;   LOWER EXTREMITY ANGIOGRAPHY Right 04/08/2022   Procedure: Lower Extremity Angiography;  Surgeon: Renford Dills, MD;  Location: [facility] INVASIVE CV LAB;  Service: Cardiovascular;  Laterality: Right;   LOWER EXTREMITY INTERVENTION  11/18/2016   Procedure: Lower Extremity Intervention;  Surgeon: Renford Dills, MD;  Location: [facility] INVASIVE CV LAB;  Service: Cardiovascular;;   PERIPHERAL VASCULAR CATHETERIZATION Right 01/02/2015   Procedure: Lower Extremity Angiography;  Surgeon: Renford Dills, MD;  Location: [facility] INVASIVE CV LAB;  Service: Cardiovascular;  Laterality: Right;   PERIPHERAL VASCULAR CATHETERIZATION Right 06/18/2015   Procedure: Lower Extremity Angiography;  Surgeon: Annice Needy, MD;  Location: [facility] INVASIVE CV LAB;  Service: Cardiovascular;   Laterality: Right;   PERIPHERAL VASCULAR CATHETERIZATION  06/18/2015   Procedure: Lower Extremity Intervention;  Surgeon: Annice Needy, MD;  Location: [facility] INVASIVE CV LAB;  Service: Cardiovascular;;   PERIPHERAL VASCULAR CATHETERIZATION N/A 10/09/2015   Procedure: Abdominal Aortogram w/Lower Extremity;  Surgeon: Renford Dills, MD;  Location: [facility] INVAS[facility] LAB;  Service: Cardiovascular;  Laterality: N/A;   PERIPHERAL VASCULAR CATHETERIZATION  10/09/2015   Procedure: Lower Extremity Intervention;  Surgeon: Renford Dills, MD;  Location: [facility] INV[facility] LAB;  Service: Cardiovascular;;   PERIPHERAL VASCULAR CATHETERIZATION Right 10/10/2015   Procedure: Lower Extremity Angiography;  Surgeon: Annice Needy, MD;  Location: [facility] INV[facility] LAB;  Service: Cardiovascular;  Laterality: Right;   PERIPHERAL VASCULAR CATHETERIZATION Right 10/10/2015   Procedure: Lower Extremity Intervention;  Surgeon: Annice Needy, MD;  Location: [facility] INV[facility] LAB;  Service: Cardiovascular;  Laterality: Right;   PERIPHERAL VASCULAR CATHETERIZATION Right 12/03/2015   Procedure: Lower Extremity Angiography;  Surgeon: Annice Needy, MD;  Location: [facility] IN[facility] LAB;  Service: Cardiovascular;  Laterality: Right;   PERIPHERAL VASCULAR CATHETERIZATION Right 12/04/2015   Procedure: Lower Extremity Angiography;  Surgeon: Annice Needy, MD;  Location: [facility] LAB;  Service: Cardiovascular;  Laterality: Right;   PERIPHERAL VASCULAR CATHETERIZATION  12/04/2015   Procedure: Lower Extremity Intervention;  Surgeon: Annice Needy, MD;  Location: [facility] INV[facility] LAB;  Service: Cardiovascular;;   PERIPHERAL VASCULAR CATHETERIZATION Right 06/30/2016   Procedure: Lower Extremity Angiography;  Surgeon: Annice Needy, MD;  Location: [facility] INV[facility] LAB;  Service: Cardiovascular;  Laterality: Right;   PERIPHERAL VASCULAR CATHETERIZATION Right 06/25/2016   Procedure: Lower Extremity Angiography;  Surgeon: Renford Dills, MD;  Location:  [facility] INVASIVE CV LAB;  Service: Cardiovascular;  Laterality: Right;   PERIPHERAL VASCULAR CATHETERIZATION Right 07/01/2016   Procedure: Lower Extremity Angiography;  Surgeon: Renford Dills, MD;  Location: [facility] IN[facility];  Service: Cardiovascular;  Laterality: Right;   PERIPHERAL VASCULAR CATHETERIZATION  07/01/2016   Procedure: Lower Extremity Intervention;  Surgeon: Renford Dills, MD;  Location: [facility] INV[facility] LAB;  Service: Cardiovascular;;   PERIPHERAL VASCULAR CATHETERIZATION Right 08/01/2016   Procedure: Lower Extremity Angiography;  Surgeon: Renford Dills, MD;  Location: [facility] INVAS[facility] CV LAB;  Service: Cardiovascular;  Laterality: Right;   PORTA CATH INSERTION N/A 10/05/2020   Procedure: PORTA CATH INSERTION;  Surgeon: Renford Dills, MD;  Location: [facility] INVASIVE CV LAB;  Service: Cardiovascular;  Laterality: N/A;   stent placement in right leg Right    VIDEO BRONCHOSCOPY N/A 09/14/2020   Procedure: VIDEO BRONCHOSCOPY WITH FLUORO;  Surgeon: Salena Saner, MD;  Location: [facility] ORS;  Service: Cardiopulmonary;  Laterality: N/A;   VIDEO BRONCHOSCOPY WITH ENDOBRONCHIAL ULTRASOUND N/A 09/14/2020   Procedure: VIDEO BRONCHOSCOPY WITH ENDOBRONCHIAL ULTRASOUND;  Surgeon: Salena Saner, MD;  Location: [facility] ORS;  Service: Cardiopulmonary;  Laterality: N/A;    SOCIAL HISTORY: Social History   Socioeconomic History   Marital status: Single    Spouse name: Not on file   Number of children: Not on file   Years of education: Not on file   Highest education level: Not on file  Occupational History   Occupation: unemployed  Tobacco Use   Smoking status: Former    Current packs/day: 0.00    Average packs/day: 2.0 packs/day for 38.0 years (76.0 ttl pk-yrs)    Types: Cigarettes    Start date: 12/19/1976    Quit date: 12/20/2014    Years since quitting: 8.7   Smokeless tobacco: Never   Tobacco comments:    quit smoking in 2017 most smoked 2   Vaping Use   Vaping status:  Former  Substance and Sexual Activity   Alcohol use: Not Currently   Drug use: No   Sexual activity: Yes  Other Topics Concern   Not on file  Social History Narrative   Not on file   Social Drivers of Health   Financial Resource Strain: Low Risk  (07/27/2023)   [facility] from [facility]   Overall Financial Resource Strain (CARDIA)    Difficulty of Paying Living Expenses: Not hard at all  Food Insecurity: No Food Insecurity (08/21/2023)   Hunger Vital Sign    Worried About Running Out of Food in the Last Year: Never true    Ran Out of Food in the Last Year: Never true  Transportation Needs: No Transportation Needs (08/21/2023)   PRAPARE - Administrator, Civil Service (Medical): No    Lack of Transportation (Non-Medical): No  Physical Activity: Insufficiently Active (05/29/2023)   Exercise Vital Sign    Days of Exercise per Week: 2 days    Minutes of Exercise per Session: 10 min  Stress: No Stress Concern Present (05/29/2023)   [employer] of Occupational Health - Occupational Stress Questionnaire    Feeling  of Stress : Not at all  Social Connections: Socially Isolated (08/21/2023)   Social Connection and Isolation Panel [NHANES]    Frequency of Communication with Friends and Family: More than three times a week    Frequency of Social Gatherings with Friends and Family: Three times a week    Attends Religious Services: Never    Active Member of Clubs or Organizations: No    Attends Banker Meetings: Never    Marital Status: Never married  Intimate Partner Violence: Not At Risk (08/21/2023)   Humiliation, Afraid, Rape, and Kick questionnaire    Fear of Current or Ex-Partner: No    Emotionally Abused: No    Physically Abused: No    Sexually Abused: No    FAMILY HISTORY: Family History  Problem Relation Age of Onset   Diabetes Mother    Hypertension Mother    Heart murmur Mother    Leukemia Mother    Throat cancer Maternal  Grandmother    Colon cancer Neg Hx    Breast cancer Neg Hx     ALLERGIES:  has no known allergies.  MEDICATIONS:  Current Outpatient Medications  Medication Sig Dispense Refill   aspirin EC 81 MG tablet Take 81 mg by mouth daily. Swallow whole.     clopidogrel (PLAVIX) 75 MG tablet TAKE 1 TABLET BY MOUTH DAILY 100 tablet 2   dexamethasone (DECADRON) 1 MG tablet Take 3 tablets (3 mg total) by mouth daily with breakfast for 7 days, THEN 2 tablets (2 mg total) daily with breakfast for 7 days, THEN 1 tablet (1 mg total) daily with breakfast for 7 days. 42 tablet 0   levETIRAcetam (KEPPRA) 1000 MG tablet Take 1 tablet (1,000 mg total) by mouth 2 (two) times daily. 60 tablet 2   levothyroxine (LEVOXYL) 125 MCG tablet Take 1 tablet (125 mcg total) by mouth daily before breakfast. 90 tablet 0   clopidogrel (PLAVIX) 75 MG tablet Take 1 tablet (75 mg total) by mouth daily. (Patient not taking: Reported on 09/23/2023) 100 tablet 2   rosuvastatin (CRESTOR) 40 MG tablet Take 1 tablet (40 mg total) by mouth daily. 90 tablet 3   No current facility-administered medications for this visit.   Facility-Administered Medications Ordered in Other Visits  Medication Dose Route Frequency Provider Last Rate Last Admin   albuterol (PROVENTIL) (2.5 MG/3ML) 0.083% nebulizer solution 2.5 mg  2.5 mg Nebulization Once Salena Saner, MD       durvalumab (IMFINZI) 1,000 mg in sodium chloride 0.9 % 100 mL chemo infusion  10 mg/kg (Treatment Plan Recorded) Intravenous Once Joshua Patience, MD       heparin lock flush 100 unit/mL  500 Units Intracatheter Once PRN Joshua Patience, MD       heparin lock flush 100 unit/mL  500 Units Intracatheter Once PRN Joshua Patience, MD       sodium chloride flush (NS) 0.9 % injection 10 mL  10 mL Intravenous PRN Joshua Patience, MD   10 mL at 09/23/23 0835     PHYSICAL EXAMINATION: ECOG PERFORMANCE STATUS: 1 - Symptomatic but completely ambulatory Vitals:   09/23/23 0905  BP: (!) 147/76  Pulse: 72   Resp: (!) 24  Temp: 98.4 F (36.9 C)  SpO2: 99%    Filed Weights   09/23/23 0905  Weight: 217 lb 9.6 oz (98.7 kg)     Physical Exam Constitutional:      General: He is not in acute distress.    Appearance: He  is obese.     Comments: Walks with a cane  HENT:     Head: Normocephalic and atraumatic.  Eyes:     General: No scleral icterus. Cardiovascular:     Rate and Rhythm: Normal rate and regular rhythm.  Pulmonary:     Effort: Pulmonary effort is normal. No respiratory distress.     Breath sounds: Normal breath sounds. No wheezing.  Abdominal:     General: There is no distension.     Palpations: Abdomen is soft.     Tenderness: There is no abdominal tenderness.  Musculoskeletal:     Cervical back: Normal range of motion and neck supple.  Skin:    General: Skin is warm and dry.     Findings: No erythema.  Neurological:     Mental Status: He is alert and oriented to person, place, and time. Mental status is at baseline.     Cranial Nerves: No cranial nerve deficit.     Motor: No abnormal muscle tone.  Psychiatric:        Mood and Affect: Mood and affect normal.      LABORATORY DATA:  I have reviewed the data as listed    Latest Ref Rng & Units 09/23/2023    8:35 AM 09/09/2023    7:54 AM 08/26/2023    5:00 AM  CBC  WBC 4.0 - 10.5 K/uL 6.0  10.5  6.9   Hemoglobin 13.0 - 17.0 g/dL 82.9  56.2  13.0   Hematocrit 39.0 - 52.0 % 40.2  41.3  38.1   Platelets 150 - 400 K/uL 145  207  222       Latest Ref Rng & Units 09/23/2023    8:35 AM 09/09/2023    7:54 AM 08/26/2023    5:00 AM  CMP  Glucose 70 - 99 mg/dL 865  784  696   BUN 8 - 23 mg/dL 20  26  22    Creatinine 0.61 - 1.24 mg/dL 2.95  2.84  1.32   Sodium 135 - 145 mmol/L 136  140  140   Potassium 3.5 - 5.1 mmol/L 3.5  4.0  4.2   Chloride 98 - 111 mmol/L 101  104  105   CO2 22 - 32 mmol/L 28  26  28    Calcium 8.9 - 10.3 mg/dL 9.0  9.2  8.7   Total Protein 6.5 - 8.1 g/dL 6.7  6.5    Total Bilirubin 0.0 - 1.2  mg/dL 0.6  0.6    Alkaline Phos 38 - 126 U/L 75  88    AST 15 - 41 U/L 27  20    ALT 0 - 44 U/L 33  49       RADIOGRAPHIC STUDIES: I have personally reviewed the radiological images as listed and agreed with the findings in the report. MR BRAIN W WO CONTRAST Result Date: 08/21/2023 CLINICAL DATA:  Metastatic disease evaluation. History of metastatic lung cancer. EXAM: MRI HEAD WITHOUT AND WITH CONTRAST TECHNIQUE: Multiplanar, multiecho pulse sequences of the brain and surrounding structures were obtained without and with intravenous contrast. CONTRAST:  10mL GADAVIST GADOBUTROL 1 MMOL/ML IV SOLN COMPARISON:  Head CT 08/20/2023 and MRI 08/05/2023 FINDINGS: Brain: Heterogeneously enhancing, partially necrotic lesions measuring up to 2.7 cm in the medial aspect of the left postcentral gyrus (series 11, image 47 and series 12, image 14) and 2.1 cm in the lateral aspect of the left precentral gyrus (series 11, image 33 and series 12, image 20)  are stable to minimally larger than on the recent prior MRI. Extensive vasogenic edema in the left frontal and parietal lobes has mildly worsened from the prior MRI with increased rightward midline shift now measuring 7 mm. Mild dural enhancement along the left parietal convexity overlying the postcentral gyrus lesion is unchanged. A 1 cm peripherally enhancing lesion in the right occipital lobe is unchanged in size (series 11, image 23) with minimal surrounding edema which has slightly increased. A punctate focus of faint residual enhancement corresponding to a known left cerebellar lesion is unchanged (series 11, image 13). No acute infarct or extra-axial fluid collection is evident. There are chronic blood products associated with the left cerebral lesions. Patchy T2 hyperintensities elsewhere in the cerebral white matter bilaterally are unchanged and nonspecific but compatible with mild-to-moderate chronic small vessel ischemic disease. There is partial effacement of  the left lateral ventricle. There is no evidence of hydrocephalus. No new enhancing brain lesions are identified. Vascular: Major intracranial vascular flow voids are preserved. Skull and upper cervical spine: No suspicious marrow lesion. Sinuses/Orbits: Unremarkable orbits. Mild mucosal thickening in the paranasal sinuses. Clear mastoid air cells. Other: None. IMPRESSION: 1. Stable to minimally increased size of 2 left cerebral metastases since 08/05/2023. Worsening, extensive vasogenic edema with 7 mm of rightward midline shift. 2. Unchanged size of small right occipital and left cerebellar metastases. 3. No new metastases. Electronically Signed   By: Sebastian Ache M.D.   On: 08/21/2023 10:30   Overnight EEG with video Result Date: 08/21/2023 Charlsie Quest, MD     08/22/2023  7:25 AM Patient Name: Joshua [name] MRN: [id number] Epilepsy Attending: Charlsie Quest Referring Physician/Provider: Erick Blinks, MD Duration: 08/21/2023 0420 to 2304 Patient history: 68 y.o. 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  He was noted to have twitching of his right side concerning for a seizure. EEG to evaluate for seizure Level of alertness: Awake, asleep AEDs during EEG study: LEV Technical aspects: This EEG study was done with scalp electrodes positioned according to the 10-20 International system of electrode placement. Electrical activity was reviewed with band pass filter of 1-70Hz , sensitivity of 7 uV/mm, display speed of 32mm/sec with a 60Hz  notched filter applied as appropriate. EEG data were recorded continuously and digitally stored.  Video monitoring was available and reviewed as appropriate. Description: The posterior dominant rhythm consists of  9 Hz activity of moderate voltage (25-35 uV) seen predominantly in posterior head regions, symmetric and reactive to eye opening and eye closing. Sleep was characterized by vertex waves, sleep spindles (12 to 14 Hz), maximal frontocentral region. EEG showed intermittent 3 to 5 Hz theta-delta slowing in left temporal region. Hyperventilation and photic stimulation were not performed.  EEG was disconnected between 0906 to 1001 and between 1554 to 1845 ABNORMALITY - Intermittent slow, left temporal region. IMPRESSION: This study is suggestive of cortical dysfunction arising from left temporal region likely secondary to underlying structural abnormality/ tumor. No seizures or epileptiform discharges were seen throughout the recording. Charlsie Quest   CT HEAD CODE STROKE WO CONTRAST Result Date: 08/20/2023 CLINICAL DATA:  Code stroke.  Neuro deficit, acute, stroke suspected EXAM: CT HEAD WITHOUT CONTRAST TECHNIQUE: Contiguous axial images were obtained from the base of the  skull through the vertex without intravenous contrast. RADIATION DOSE REDUCTION: This exam was performed according to the departmental dose-optimization program which includes automated exposure control, adjustment of the mA and/or kV according to patient size and/or use of iterative reconstruction technique. COMPARISON:  None Available. FINDINGS: Motion limited study.  Within this limitation: Brain: Increasing edema in the left frontoparietal region. No definite new acute hemorrhage. Small focus of hyperdensity in the left frontal region likely represents mineralization or hemorrhage with in the known metastasis, likely present on prior MRI. No obvious superimposed acute large vascular territory infarct. No hydrocephalus. Vascular: Not well assessed. Skull: No acute fracture. Sinuses/Orbits: Clear sinuses.  No acute orbital findings. Other: No mastoid effusions. IMPRESSION: Increasing left frontoparietal edema and mass effect with 9 mm of right  midline shift, likely related to worsening metastatic disease given findings on recent MRI head. Repeat MRI head with contrast could confirm and better assess if clinically warranted. Code stroke imaging results were communicated on 08/20/2023 at 11:15 pm to provider [facility] via telephone, who verbally acknowledged these results. Electronically Signed   By: Feliberto Harts M.D.   On: 08/20/2023 23:20   MR BRAIN W WO CONTRAST Result Date: 08/05/2023 CLINICAL DATA:  Metastatic lung cancer, restaging EXAM: MRI HEAD WITHOUT AND WITH CONTRAST TECHNIQUE: Multiplanar, multiecho pulse sequences of the brain and surrounding structures were obtained without and with intravenous contrast. CONTRAST:  9mL GADAVIST GADOBUTROL 1 MMOL/ML IV SOLN COMPARISON:  07/03/2023 FINDINGS: Brain: Redemonstrated peripherally enhancing mass in the medial left postcentral gyrus, which measures up to 2.4 x 2.2 x 1.8 cm (AP x TR x CC) (series 18, image 26 and series 16, image 143), previously 2.0 x 2.1 x 1.8 cm when remeasured similarly. Increased associated T2 hyperintense signal (series 9, image 59). Enhancing mass in the left precentral gyrus measures 1.6 x 1.3 x 1.7 cm (AP x TR x CC) (series 16, image 112 and series 18, image 32), previously 1.5 x 1.3 x 1.8 cm, overall unchanged. Unchanged surrounding T2 hyperintense signal. Minimal peripheral enhancement about the lesion in the right occipital lobe, which measures up to 0.9 x 0.8 x 0.6 cm (AP x TR x CC) (series 16, image 62 and series 18, image 12), previously 1.0 x 1.1 x 0.8 cm. Decreased surrounding T2 hyperintense signal. Diminishing enhancement in a previously noted left cerebellar lesion (series 16, image 43). No new abnormal enhancement. No acute infarct, hemorrhage, or midline shift. No hydrocephalus or extra-axial collection. Vascular: Normal arterial flow voids. Normal arterial and venous enhancement. Skull and upper cervical spine: Normal marrow signal. Sinuses/Orbits: Clear  paranasal sinuses. No acute finding in the orbits. Other: The mastoid air cells are well aerated. IMPRESSION: 1. Slight interval increase in size of the peripherally enhancing mass in the medial left postcentral gyrus, with increased associated T2 hyperintense signal. 2. Unchanged size of the enhancing mass in the left precentral gyrus. 3. Decreased size of the enhancing lesion in the right occipital lobe, with decreased surrounding T2 hyperintense signal. 4. Diminishing enhancement in a previously noted left cerebellar lesion. 5. No new abnormal enhancement. Electronically Signed   By: Wiliam Ke M.D.   On: 08/05/2023 14:50   VAS [place] ABI WITH/WO TBI Result Date: 07/09/2023  LOWER EXTREMITY DOPPLER STUDY Patient Name:  ABDOU STOCKS  Date of Exam:   07/09/2023 Medical Rec #: [id number]            Accession #:    [id number] Date of Birth: 03/11/1956  Patient Gender: M Patient Age:   49 years Exam Location:  [facility] Procedure:      VAS [place] ABI WITH/WO TBI Referring Phys: GREGORY SCHNIER --------------------------------------------------------------------------------  Indications: Peripheral artery disease, and Routine f/u. Patient reports no              claudication or lower extremity pain. High Risk Factors: Hyperlipidemia, past history of smoking. Other Factors: ASO s/p multiple interventions and bypass.                11/15/2019 Right thrombectomy of Fempop insitu graft with added                stent throughout.                 12/01/2019: Aortogram and Selective Right Lower Extremity                Angiogram. 6 mg of TPA delivered in a lysis catheterfrom the                Origin of the Right Femoral -Popliteal bypass down the proximal                ATA with placement of the lysis.                 12/02/2019: PTA of the Right Posterior Tibial Artery. PTA of the                Right Peroneal Artery. Mechanical Thrombectomy of the Right                Femoral -Popliteal Bypass  graft. Mechanical Thrombectomy of the                Right Tibioperoneal Trunk and Posterior Tibial Artery.                 09/25/2021: Right Lower Extremity Angiography third order                catheter placement. PTA of the proximal peroneal and                tibioperoneal trunk to 2.5 mm with an ultra score balloon. PTA of                the Femoral Artery and the proximal anastomosis of the bypass                graft to 4 mm with an ultra score balloon. Administration of 5 mg                of tpa throughout the entire bypass and in the peroneal Artery.                Initiation of continuous tpa infusion Right lower Extremity for                limb salvage. Insertion of a triple lumen catheter Left CFV with                [place] and fluoroscopic guidance.                 09/26/2021: Right LE Angiogram. PTA of the Right Posterior Tibial                Artery with 2.5 mm diameter by 30 cm length angioplasty balloon.                PTA of the  distal bypass anastomosis and distal bypass graft                analogous to the Popliteal artery with 4 mm diameter by 8 cm                length Lutonix drug coated angioplasty balloon.                04/08/2022 right thrombectomy of the Deep profunda.  Vascular Interventions: Hx of multiple right lower extremity PTA's/stents.                         09/10/2016 Right femoral to below the knee popliteal                         artery bypass graft placement. 5/01 Right bypass graft &                         mid popliteal artery PTA & coil embolectomy of large                         branch of bypass graft. 12/09/16 Right distal bypass                         graft PTA/stent and right posterior tibial artery PTA.                         03/23/2020 Rt PTA thrombectomy. Rt Profunda fem artery to                         PTA bypass. Performing Technologist: Hardie Lora RVT  Examination Guidelines: A complete evaluation includes at minimum, Doppler waveform signals and systolic  blood pressure reading at the level of bilateral brachial, anterior tibial, and posterior tibial arteries, when vessel segments are accessible. Bilateral testing is considered an integral part of a complete examination. Photoelectric Plethysmograph (PPG) waveforms and toe systolic pressure readings are included as required and additional duplex testing as needed. Limited examinations for reoccurring indications may be performed as noted.  ABI Findings: +---------+------------------+-----+----------+--------+ Right    Rt Pressure (mmHg)IndexWaveform  Comment  +---------+------------------+-----+----------+--------+ Brachial 134                                       +---------+------------------+-----+----------+--------+ PTA      59                0.44 monophasic         +---------+------------------+-----+----------+--------+ DP       47                0.35 monophasic         +---------+------------------+-----+----------+--------+ Great Toe32                0.24                    +---------+------------------+-----+----------+--------+ +---------+------------------+-----+---------+-------+ Left     Lt Pressure (mmHg)IndexWaveform Comment +---------+------------------+-----+---------+-------+ Brachial 131                                     +---------+------------------+-----+---------+-------+ PTA  151               1.13 triphasic        +---------+------------------+-----+---------+-------+ DP       148               1.10 triphasic        +---------+------------------+-----+---------+-------+ Great Toe82                0.61                  +---------+------------------+-----+---------+-------+ +-------+-----------+-----------+------------+------------+ ABI/TBIToday's ABIToday's TBIPrevious ABIPrevious TBI +-------+-----------+-----------+------------+------------+ Right  0.44       0.24       0.45        0.43          +-------+-----------+-----------+------------+------------+ Left   1.13       0.61       1.03        1.02         +-------+-----------+-----------+------------+------------+  Bilateral ABIs appear essentially unchanged compared to prior study on 01/05/2023.  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 The right toe-brachial index is abnormal. Left: Resting left ankle-brachial index is within normal range. The left toe-brachial index is abnormal. *See table(s) above for measurements and observations.  Electronically signed by Levora Dredge MD on 07/09/2023 at 4:17:26 PM.    Final    MR BRAIN W WO CONTRAST Result Date: 07/03/2023 CLINICAL DATA:  Brain/CNS neoplasm, assess treatment response. Metastatic lung cancer, restaging. EXAM: MRI HEAD WITHOUT AND WITH CONTRAST TECHNIQUE: Multiplanar, multiecho pulse sequences of the brain and surrounding structures were obtained without and with intravenous contrast. CONTRAST:  9mL GADAVIST GADOBUTROL 1 MMOL/ML IV SOLN COMPARISON:  MRI brain 04/16/2023. FINDINGS: Brain: Continued increase in size of the peripherally enhancing mass centered within the medial aspect of the left postcentral gyrus, now measuring up to 22 mm (coronal image 14 series 17), previously 17 mm. Associated increase in surrounding vasogenic edema. Overall unchanged size but decreased central necrotic component of the 17 mm lesion in the lateral aspect of the left precentral gyrus (coronal image 24 series 17) with associated increase in surrounding vasogenic edema. Unchanged peripherally enhancing lesion in the right occipital lobe (axial image 65 series 16). Slightly decreased punctate enhancing lesion in anterior aspect of the left cerebellar hemisphere (axial image 44 series 16). No acute infarct or hemorrhage. No hydrocephalus or extra-axial collection. No midline shift. Vascular: Normal flow voids and vessel enhancement. Skull  and upper cervical spine: Normal marrow signal and enhancement. Sinuses/Orbits: No acute findings. Other: None. IMPRESSION: 1. Continued increase in size of the peripherally enhancing mass centered within the medial aspect of the left postcentral gyrus, now measuring up to 22 mm, previously 17 mm. Associated increase in surrounding vasogenic edema. 2. Overall unchanged size but decreased central necrotic component of the 17 mm lesion in the lateral aspect of the left precentral gyrus with associated increase in surrounding vasogenic edema. 3. Unchanged peripherally enhancing lesion in the right occipital lobe. Slightly decreased punctate enhancing lesion in the anterior aspect of the left cerebellar hemisphere. Electronically Signed   By: Orvan Falconer M.D.   On: 07/03/2023 13:47   CT CHEST ABDOMEN PELVIS W CONTRAST Result Date: 06/29/2023 CLINICAL DATA:  Lung cancer restaging * Tracking Code: BO * EXAM: CT CHEST, ABDOMEN, AND PELVIS WITH CONTRAST TECHNIQUE: Multidetector CT imaging of the chest, abdomen and pelvis was performed following the standard protocol during bolus administration of  intravenous contrast. RADIATION DOSE REDUCTION: This exam was performed according to the departmental dose-optimization program which includes automated exposure control, adjustment of the mA and/or kV according to patient size and/or use of iterative reconstruction technique. CONTRAST:  OMNIPAQUE IOHEXOL 300 MG/ML  SOLN COMPARISON:  03/12/2023 FINDINGS: CT CHEST FINDINGS Cardiovascular: Right chest port catheter. Normal heart size. Left and right coronary artery calcifications. No pericardial effusion. Mediastinum/Nodes: No enlarged mediastinal, hilar, or axillary lymph nodes. Thyroid gland, trachea, and esophagus demonstrate no significant findings. Lungs/Pleura: Minimal paraseptal emphysema. Unchanged post treatment/post radiation appearance of the right lung, with perihilar and suprahilar paramedian fibrosis and  consolidation (series 4, image 59). No pleural effusion or pneumothorax. Musculoskeletal: No chest wall abnormality. No acute osseous findings. CT ABDOMEN PELVIS FINDINGS Hepatobiliary: No solid liver abnormality is seen. Hepatic steatosis. No gallstones, gallbladder wall thickening, or biliary dilatation. Pancreas: Unremarkable. No pancreatic ductal dilatation or surrounding inflammatory changes. Spleen: Normal in size without significant abnormality. Adrenals/Urinary Tract: Adrenal glands are unremarkable. Kidneys are normal, without renal calculi, solid lesion, or hydronephrosis. Bladder is unremarkable. Stomach/Bowel: Stomach is within normal limits. Appendix appears normal. No evidence of bowel wall thickening, distention, or inflammatory changes. Sigmoid diverticulosis. Vascular/Lymphatic: Aortic atherosclerosis. No enlarged abdominal or pelvic lymph nodes. Reproductive: No mass or other abnormality. Other: No abdominal wall hernia or abnormality. No ascites. Musculoskeletal: No acute osseous findings. IMPRESSION: 1. Unchanged post treatment/post radiation appearance of the right lung, with perihilar and suprahilar paramedian fibrosis and consolidation. 2. No evidence of lymphadenopathy or metastatic disease in the chest, abdomen, or pelvis. 3. Hepatic steatosis. 4. Coronary artery disease. Aortic Atherosclerosis (ICD10-I70.0) and Emphysema (ICD10-J43.9). Electronically Signed   By: Jearld Lesch M.D.   On: 06/29/2023 07:39

## 2023-09-23 NOTE — Assessment & Plan Note (Signed)
 Follow up with Dr. Barbaraann Cao.  Continue keppra and tapering course of  dexamethasone as advised by Dr. Barbaraann Cao.

## 2023-09-23 NOTE — Patient Instructions (Signed)
 [facility] - A DEPT OF [facility]  Discharge Instructions: Thank you for choosing [facility] to provide your oncology and hematology care.  If you have a lab appointment with the Cancer Center, please go directly to the Cancer Center and check in at the registration area.  Wear comfortable clothing and clothing appropriate for easy access to any Portacath or PICC line.   We strive to give you quality time with your provider. You may need to reschedule your appointment if you arrive late (15 or more minutes).  Arriving late affects you and other patients whose appointments are after yours.  Also, if you miss three or more appointments without notifying the office, you may be dismissed from the clinic at the provider's discretion.      For prescription refill requests, have your pharmacy contact our office and allow 72 hours for refills to be completed.    Today you received the following chemotherapy and/or immunotherapy agents Imfinzi      To help prevent nausea and vomiting after your treatment, we encourage you to take your nausea medication as directed.  BELOW ARE SYMPTOMS THAT SHOULD BE REPORTED IMMEDIATELY: *FEVER GREATER THAN 100.4 F (38 C) OR HIGHER *CHILLS OR SWEATING *NAUSEA AND VOMITING THAT IS NOT CONTROLLED WITH YOUR NAUSEA MEDICATION *UNUSUAL SHORTNESS OF BREATH *UNUSUAL BRUISING OR BLEEDING *URINARY PROBLEMS (pain or burning when urinating, or frequent urination) *BOWEL PROBLEMS (unusual diarrhea, constipation, pain near the anus) TENDERNESS IN MOUTH AND THROAT WITH OR WITHOUT PRESENCE OF ULCERS (sore throat, sores in mouth, or a toothache) UNUSUAL RASH, SWELLING OR PAIN  UNUSUAL VAGINAL DISCHARGE OR ITCHING   Items with * indicate a potential emergency and should be followed up as soon as possible or go to the Emergency Department if any problems should occur.  Please show the CHEMOTHERAPY ALERT CARD or IMMUNOTHERAPY  ALERT CARD at check-in to the Emergency Department and triage nurse.  Should you have questions after your visit or need to cancel or reschedule your appointment, please contact [facility] - A DEPT OF [facility]  [phone] and follow the prompts.  Office hours are 8:00 a.m. to 4:30 p.m. Monday - Friday. Please note that voicemails left after 4:00 p.m. may not be returned until the following business day.  We are closed weekends and major holidays. You have access to a nurse at all times for urgent questions. Please call the main number to the clinic [phone] and follow the prompts.  For any non-urgent questions, you may also contact your provider using MyChart. We now offer e-Visits for anyone 1 and older to request care online for non-urgent symptoms. For details visit [email or web address].   Also download the MyChart app! Go to the app store, search "MyChart", open the app, select [place], and log in with your MyChart username and password.

## 2023-09-23 NOTE — Assessment & Plan Note (Signed)
 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  Labs are reviewed and discussed with patient. Proceed with  Durvalumab.

## 2023-09-23 NOTE — Addendum Note (Signed)
 Addended by: Jim Like on: 09/23/2023 10:03 AM   Modules accepted: Orders

## 2023-09-24 ENCOUNTER — Other Ambulatory Visit: Payer: HMO

## 2023-09-24 ENCOUNTER — Ambulatory Visit: Payer: HMO

## 2023-09-24 ENCOUNTER — Ambulatory Visit: Payer: HMO | Admitting: Oncology

## 2023-09-28 ENCOUNTER — Other Ambulatory Visit: Payer: Self-pay

## 2023-09-28 ENCOUNTER — Telehealth: Payer: Self-pay | Admitting: *Deleted

## 2023-09-28 MED ORDER — DEXAMETHASONE 2 MG PO TABS
2.0000 mg | ORAL_TABLET | Freq: Every day | ORAL | 2 refills | Status: DC
  Filled 2023-09-28: qty 30, 30d supply, fill #0

## 2023-09-28 NOTE — Addendum Note (Signed)
 Addended by: Henreitta Leber on: 09/28/2023 02:03 PM   Modules accepted: Orders

## 2023-09-28 NOTE — Telephone Encounter (Signed)
 I received a call from Joshua [name] this morning. He has had a headache since Friday, the steroids were completed on Thursday. He is not able to use his Rt hand, stating that his rt hand is very week and his grip is uncontrolled, will not let go of things.   He is requesting a call back please.   Joshua [name].     Called patient to confirm above message from [employer].  He stated that the weakness started after stopping the steroid.  Reports having no seizures.  States that the headaches do stop with tylenol.  Routed to Dr Barbaraann Cao to please advise.  Pharmacy is [facility].

## 2023-09-28 NOTE — Telephone Encounter (Signed)
 Communicated response to the patient.

## 2023-10-07 ENCOUNTER — Inpatient Hospital Stay (HOSPITAL_BASED_OUTPATIENT_CLINIC_OR_DEPARTMENT_OTHER): Payer: HMO | Admitting: Oncology

## 2023-10-07 ENCOUNTER — Inpatient Hospital Stay: Payer: HMO

## 2023-10-07 ENCOUNTER — Encounter: Payer: Self-pay | Admitting: Oncology

## 2023-10-07 VITALS — BP 128/70 | HR 76 | Temp 97.9°F | Resp 18 | Wt 219.9 lb

## 2023-10-07 VITALS — BP 133/73 | HR 77 | Temp 96.8°F | Resp 19

## 2023-10-07 DIAGNOSIS — C3491 Malignant neoplasm of unspecified part of right bronchus or lung: Secondary | ICD-10-CM

## 2023-10-07 DIAGNOSIS — R569 Unspecified convulsions: Secondary | ICD-10-CM

## 2023-10-07 DIAGNOSIS — E032 Hypothyroidism due to medicaments and other exogenous substances: Secondary | ICD-10-CM

## 2023-10-07 DIAGNOSIS — Z5112 Encounter for antineoplastic immunotherapy: Secondary | ICD-10-CM

## 2023-10-07 DIAGNOSIS — C7931 Secondary malignant neoplasm of brain: Secondary | ICD-10-CM

## 2023-10-07 LAB — CBC WITH DIFFERENTIAL/PLATELET
Abs Immature Granulocytes: 0.04 10*3/uL (ref 0.00–0.07)
Basophils Absolute: 0.1 10*3/uL (ref 0.0–0.1)
Basophils Relative: 1 %
Eosinophils Absolute: 0 10*3/uL (ref 0.0–0.5)
Eosinophils Relative: 0 %
HCT: 39.9 % (ref 39.0–52.0)
Hemoglobin: 12.8 g/dL — ABNORMAL LOW (ref 13.0–17.0)
Immature Granulocytes: 1 %
Lymphocytes Relative: 12 %
Lymphs Abs: 0.9 10*3/uL (ref 0.7–4.0)
MCH: 27.8 pg (ref 26.0–34.0)
MCHC: 32.1 g/dL (ref 30.0–36.0)
MCV: 86.7 fL (ref 80.0–100.0)
Monocytes Absolute: 0.5 10*3/uL (ref 0.1–1.0)
Monocytes Relative: 7 %
Neutro Abs: 5.5 10*3/uL (ref 1.7–7.7)
Neutrophils Relative %: 79 %
Platelets: 221 10*3/uL (ref 150–400)
RBC: 4.6 MIL/uL (ref 4.22–5.81)
RDW: 15.2 % (ref 11.5–15.5)
WBC: 7 10*3/uL (ref 4.0–10.5)
nRBC: 0 % (ref 0.0–0.2)

## 2023-10-07 LAB — COMPREHENSIVE METABOLIC PANEL
ALT: 20 U/L (ref 0–44)
AST: 18 U/L (ref 15–41)
Albumin: 3.6 g/dL (ref 3.5–5.0)
Alkaline Phosphatase: 62 U/L (ref 38–126)
Anion gap: 8 (ref 5–15)
BUN: 24 mg/dL — ABNORMAL HIGH (ref 8–23)
CO2: 25 mmol/L (ref 22–32)
Calcium: 8.6 mg/dL — ABNORMAL LOW (ref 8.9–10.3)
Chloride: 105 mmol/L (ref 98–111)
Creatinine, Ser: 0.9 mg/dL (ref 0.61–1.24)
GFR, Estimated: >60 mL/min (ref >60)
Glucose, Bld: 128 mg/dL — ABNORMAL HIGH (ref 70–99)
Potassium: 3.5 mmol/L (ref 3.5–5.1)
Sodium: 138 mmol/L (ref 135–145)
Total Bilirubin: 0.7 mg/dL (ref 0.0–1.2)
Total Protein: 6.7 g/dL (ref 6.5–8.1)

## 2023-10-07 MED ORDER — SODIUM CHLORIDE 0.9 % IV SOLN
10.0000 mg/kg | Freq: Once | INTRAVENOUS | Status: AC
  Administered 2023-10-07: 1000 mg via INTRAVENOUS
  Filled 2023-10-07: qty 20

## 2023-10-07 MED ORDER — SODIUM CHLORIDE 0.9 % IV SOLN
Freq: Once | INTRAVENOUS | Status: AC
  Filled 2023-10-07: qty 250

## 2023-10-07 MED ORDER — HEPARIN SOD (PORK) LOCK FLUSH 100 UNIT/ML IV SOLN
500.0000 [IU] | Freq: Once | INTRAVENOUS | Status: AC | PRN
  Administered 2023-10-07: 500 [IU]
  Filled 2023-10-07: qty 5

## 2023-10-07 NOTE — Assessment & Plan Note (Signed)
 continue levothyroxine 125 mcg daily

## 2023-10-07 NOTE — Assessment & Plan Note (Signed)
 Status post radiation,  02/19/22 MRI showed decreased brain lesion size and edema.  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 Mitzi Hansen.  Follow up with Dr. Barbaraann Cao. 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

## 2023-10-07 NOTE — Assessment & Plan Note (Signed)
 Follow up with Dr. Barbaraann Cao.  Continue keppra and tapering course of  dexamethasone as advised by Dr. Barbaraann Cao.

## 2023-10-07 NOTE — Assessment & Plan Note (Signed)
Durvalumab treatment as planned.  

## 2023-10-07 NOTE — Assessment & Plan Note (Signed)
 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  Labs are reviewed and discussed with patient. Proceed with  Durvalumab.

## 2023-10-07 NOTE — Progress Notes (Signed)
 Hematology/Oncology Progress note [phone] [phone]) [phone]    CHIEF COMPLAINTS/REASON FOR VISIT:  Follow up for lung cancer   ASSESSMENT & PLAN:   Cancer Staging  Squamous cell carcinoma of right lung (HCC) Staging form: Lung, AJCC 8th Edition - Clinical stage from 09/20/2020: Stage IV (cT1b, cN2, cM1) - Signed by [name], MD on 12/13/2021   Squamous cell carcinoma of right lung (HCC) 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  Labs are reviewed and discussed with patient. Proceed with  Durvalumab.   Encounter for antineoplastic immunotherapy Durvalumab treatment as planned.   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 Mitzi Hansen.  Follow up with Dr. Barbaraann Cao. 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     Follow up 2 weeks lab MD Durvalumab All questions were answered. The patient knows to call the clinic with any problems, questions or concerns.  [name], MD, PhD [facility] Hematology Oncology 10/07/2023       HISTORY OF PRESENTING ILLNESS:  Joshua [name] is a 68 y.o. male who has above history reviewed by me today presents for follow up visit for Stage IV lung squamous cell carcinoma. Oncology History  Squamous cell carcinoma of right lung (HCC)  09/18/2020 Imaging   PET scan showed right upper lobe nodule maximal SUV 11.5.  Hypermetabolic  cluster right paratracheal adenopathy with maximum SUV up to 12.  No findings of metastatic disease to the neck/abdomen/pelvis or skeleton.   09/20/2020 Initial Diagnosis   Squamous cell carcinoma of right lung (HCC) NGS showed PD-L1 TPS 95%, LRP1B S974fs, PIK3CA E545K, RB1 c1422-2A>T, RIT1 T83_ A84del, STK11 P224fs, TPS53 R248W, TMB 19.55mut/mb, MS stable.    09/20/2020 Cancer Staging   Staging form: Lung, AJCC 8th Edition - Clinical stage from 09/20/2020: Stage IV (cT1b, cN2, cM1) - Signed by [name], MD on 12/13/2021   10/03/2020 Imaging   MRI brain with and without contrast showed no evidence of intracranial metastatic disease. Well-defined round lesion within the left parotid tail measuring approximately 2.3 x 1.8 x 1.8 cm   10/09/2020 - 11/27/2020 Radiation Therapy   concurrent chemoradiation   10/10/2020 - 10/31/2020 Chemotherapy   10/10/2020 concurrent chemoradiation.  carboplatin AUC of 2 and Taxol 45 mg/m2 x 4.  Additional chemotherapy was held due to thrombocytopenia, neutropenia       12/31/2020 - 03/21/2022 Chemotherapy   Maintenance  Durvalumab q14d      12/31/2020 -  Chemotherapy   Patient is on Treatment Plan : LUNG Durvalumab (10) q14d     03/28/2021 Imaging   CT without contrast showed new linear right perihilar consolidation with associated traction bronchiectasis.  Likely postradiation changes.  Additional new peribronchial vascular/nodular groundglass opacities with areas of consolidation or seen primarily in the right upper and lower lobes, more distant from expected radiation fields. 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  Per my discussion with  pulmonology Dr. 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     05/09/2021 Imaging   Repeat CT chest without contrast showed evolving radiation changes in the right perihilar region likely accounting  for interval increase to thickening of the posterior wall of the right upper lobe bronchus.  The more peripheral right lung groundglass opacities seen on most recent study have generally improved with exception of one peripherally right upper lobe which appears new.  These are likely inflammatory.  No findings highly suspicious for local recurrence.   05/30/2021 - 01/10/2022 Chemotherapy   Durvalumab q14d     10/07/2021 Imaging   MRI brain with and without contrast showed 2 contrast-enhancing lesions, most consistent with metastatic disease.  Largest lesion is in the left frontal lobe and measures up to 1.3 cm with a large amount of surrounding edema and 9 mm rightward midline shift.  The other enhancing lesion was 4 mm in the right occipital lobe with a small amount of surrounding edema.  No acute or chronic hemorrhage.   10/07/2021 Progression   Headache after colonoscopy, progressive difficulty using her right arm.  Progressively worsening of speech. Stage IV lung cancer with brain metastasis.  started on Dexamethasone.  He was seen by neurosurgery and radiation oncology.  Recommendation is to proceed with SRS.  Patient was discharged with dexamethasone   10/16/2021 Imaging   PET scan showed postradiation changes in the right supra hilar lung without evidence of local recurrence.  No evidence of lung cancer recurrence or metastatic disease.  Single focus of intense metabolic activity associated with posterior left 11th rib likely secondary to recent fall/posttraumatic metabolic changes.  Frontal  uptake in the left frontal lobe corresponding to the known metastatic lesion in the brain   10/29/2021 -  Radiation Therapy   Brain Radiation [facility]   12/10/2021 Imaging   Brain MRI: Continued interval decrease in size of a left frontal lobe metastasis, now measuring 10 mm. Moderate surrounding edema has also decreased.  3-4 mm metastasis within the right occipital lobe, not significantly changed in size.  There is now a small focus of central non enhancement within this lesion. Increased edema. 3 mm enhancing metastasis within the high left parietal lobe. In retrospect, this was likely present as a subtle punctate focus of enhancement on the prior brain MRI of 10/22/2021.   01/18/2022 Imaging   CT chest abdomen pelvis w contrast 1. Stable post treatment changes in the right upper lobe medially with dense radiation fibrosis but no findings to suggest local recurrent tumor. 2. Significant progression of ill-defined ground-glass opacity, interstitial thickening and airspace nodularity in the right upper lobe and upper aspect of the right lower lobe. Findings could reflect an inflammatory or atypical infectious process, drug induced pneumonitis or interstitial spread of tumor.3. No mediastinal or hilar mass or adenopathy. 4. No findings for abdominal/pelvic metastatic disease or osseous metastatic disease.   02/19/2022 Imaging   MRI brain w wo contrast showed Three metastatic deposits in the brain appear smaller. Decreased edema in the left parietal lobe. No new lesions    04/04/2022 Imaging   CT chest w contrast 1. Stable radiation changes in the right perihilar region without evidence of local recurrence. 2. Interval improvement in the patchy ground-glass opacities previously demonstrated in both lungs, most consistent with a resolving infectious or inflammatory process (including immunotherapy-related pneumonitis). 3. No evidence of metastatic disease.   05/14/2022 Miscellaneous   He had a mechanical fall and fractured his left elbow. xrays  showed a left elbow fracture  05/26/22 seen by orthopedic surgeon, cast applied.    05/27/2022 Imaging   MRI brain w wo contrast 1. All three known brain metastases have enlarged since August, with increasing regional edema. And there is a small new left anterior cerebellar 4-5 mm metastasis (was questionably punctate on the May MRI). This constellation could  reflect a combination of True Progression (Left Cerebellar lesion) and pseudoprogression/radionecrosis (3 existing mets).    2. No significant intracranial mass effect. 3. Underlying moderate cerebral white matter signal changes.   05/30/2022 Imaging   CT chest abdomen pelvis w contrast  1. 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 There is a stable small right pleural effusion, but no definitive findings to suggest residual or recurrent disease on today's examination. 2. No signs of metastatic disease in the abdomen or pelvis.  3. Aortic atherosclerosis, in addition to two-vessel coronary artery disease. Please note that although the presence of coronary artery calcium documents the presence of coronary artery disease, the severity of this disease and any potential stenosis cannot be assessed on this non-gated CT examination. Assessment for potential risk factor modification, dietary therapy or pharmacologic therapy may be warranted, if clinically indicated. 4. Hepatic steatosis. 5. Colonic diverticulosis without evidence of acute diverticulitis at this time. 6. Incompletely imaged but apparently occluded vascular graft in the superior aspect of the right anterior thigh. Referral to vascular specialist for further clinical evaluation should be considered if clinically appropriate. 7. Additional incidental findings, as above.   07/22/2022 Imaging   MRI brain  1. No new or enlarging lesions. No intracranial disease progression. 2. Decreased size of treated lesions of the left frontoparietal junction and left postcentral gyrus. 3. Unchanged lesions in the left cerebellum and right occipital lobe.   10/15/2022 Imaging   MRI brain w wo contrast showed Interval increase in size of multiple intraparenchymal lesions with worsened surrounding vasogenic edema. The ring enhancement  pattern is suggestive of radiation necrosis, though continued follow-up imaging is recommended. No new lesions   11/05/2022 Imaging   MRI brain w wo contrast  1. Likely slight increase and right occipital and left cerebellar lesions. Otherwise, unchanged metastases, as above. No new lesions identified. 2. Partially imaged large probable lymph node in the left upper neck, apparent on prior PET CT.   11/13/2022 -  Radiation Therapy   Each of the sites were treated with SRS/SBRT/SRT-IMRT technique to 20 Gy in 1 fraction to each of the following sites: PTV_2_LCerebel_72mm PTV_3_RCerebel_41mm PTV_4_ROccip_77mm PTV_5_LParietal_12mm   11/27/2022 Imaging   CT chest abdomen pelvis w contrast showed  Improving bilateral scattered interstitial and ground-glass opacities. Small amount of residual areas in the extreme left lower lobe inferiorly. This specific area is slightly increased from previous but overall the amount of opacities is significantly improved. Recommend continued follow-up. No developing new mass lesion, fluid collection or lymph node enlargement. Stable posttreatment changes right perihilar in the lung. Fatty liver infiltration. Colonic diverticulosis. Improved tiny right pleural effusion   02/05/2023 Imaging   MRI brain with and without contrast Interval decrease in all of the metastatic lesions, detailed above.No new lesions identified.    Imaging   CT chest abdomen pelvis w contrast showed  1. 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 2. Trace right pleural fluid or thickening, new or minimally increased. 3. Incidental findings, including: Aortic atherosclerosis (ICD10-I70.0), coronary artery  atherosclerosis and emphysema (ICD10-J43.9). Hepatic steatosis.   06/25/2023 Imaging   CT chest abdomen pelvis w contrast showed 1. Unchanged post treatment/post radiation appearance of the right lung, with perihilar and  suprahilar paramedian fibrosis and consolidation. 2. No evidence of lymphadenopathy or metastatic disease in the chest, abdomen, or pelvis. 3. Hepatic steatosis. 4. Coronary artery disease.   Aortic Atherosclerosis (ICD10-I70.0) and Emphysema (ICD10-J43.9).   08/26/2023 [facility] Admission   hospitalization due to was observed speech and jerking movements on right side despite being on dexamethasone. Continuous EEG suggest cortical dysfunction arising from left temporal region.  No seizure or elective form discharges were seen. Patient was discharged on dexamethasone 4 mg, Keppra, lamotrigine.     Today he feels well.Intermittent headache, is better. denies any focal deficits. chronic SOB,not worse.  Patient denies any additional seizure episodes.  He feels some dizziness/imbalance while walking. No additional episodes.    Review of Systems  Constitutional:  Positive for fatigue. Negative for appetite change, chills, fever and unexpected weight change.  HENT:   Negative for hearing loss and voice change.   Eyes:  Negative for eye problems and icterus.  Respiratory:  Negative for chest tightness, cough and shortness of breath.   Cardiovascular:  Negative for chest pain and leg swelling.  Gastrointestinal:  Negative for abdominal distention and abdominal pain.  Endocrine: Negative for hot flashes.  Genitourinary:  Negative for difficulty urinating, dysuria and frequency.   Musculoskeletal:  Positive for gait problem. Negative for arthralgias.  Skin:  Negative for itching and rash.  Neurological:  Positive for dizziness and gait problem. Negative for extremity weakness, headaches, numbness and speech difficulty.  Hematological:  Negative for adenopathy. Does not bruise/bleed easily.  Psychiatric/Behavioral:  Negative for confusion.       MEDICAL HISTORY:  Past Medical History:  Diagnosis Date   Allergy    Cancer (HCC)    lung   COPD (chronic obstructive pulmonary disease)  (HCC)    Dyspnea    former smoker. 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   Malignant neoplasm metastatic to brain [facility])    Peripheral vascular disease (HCC)    Pre-diabetes    Sleep apnea     SURGICAL HISTORY: Past Surgical History:  Procedure Laterality Date   ANTERIOR CERVICAL DECOMP/DISCECTOMY FUSION N/A 03/14/2020   Procedure: ANTERIOR CERVICAL DECOMPRESSION/DISCECTOMY FUSION 3 LEVELS C4-7;  Surgeon: Venetia Night, MD;  Location: [facility] ORS;  Service: Neurosurgery;  Laterality: N/A;   BACK SURGERY  2011   CENTRAL LINE INSERTION Right 09/10/2016   Procedure: CENTRAL LINE INSERTION;  Surgeon: Renford Dills, MD;  Location: [facility] ORS;  Service: Vascular;  Laterality: Right;   CERVICAL FUSION     C4-c7 on Mar 14, 2020   COLONOSCOPY  2013 ?   COLONOSCOPY N/A 09/09/2021   Procedure: COLONOSCOPY;  Surgeon: Jaynie Collins, DO;  Location: [facility] ENDOSCOPY;  Service: Gastroenterology;  Laterality: N/A;   CORONARY ANGIOPLASTY     ESOPHAGOGASTRODUODENOSCOPY N/A 09/09/2021   Procedure: ESOPHAGOGASTRODUODENOSCOPY (EGD);  Surgeon: Jaynie Collins, DO;  Location: [facility] ENDOSCOPY;  Service: Gastroenterology;  Laterality: N/A;   FEMORAL-POPLITEAL BYPASS GRAFT Right 09/10/2016   Procedure: BYPASS GRAFT FEMORAL-POPLITEAL ARTERY ( BELOW KNEE );  Surgeon: Renford Dills, MD;  Location: [facility] ORS;  Service: Vascular;  Laterality: Right;   FEMORAL-TIBIAL BYPASS GRAFT Right 03/23/2020   Procedure: BYPASS GRAFT RIGHT FEMORAL- DISTAL TIBIAL ARTERY WITH DISTAL FLOW  GRAFT;  Surgeon: Renford Dills, MD;  Location: [facility] ORS;  Service: Vascular;  Laterality: Right;   LEFT HEART CATH AND CORONARY ANGIOGRAPHY Left 05/16/2021   Procedure: LEFT HEART CATH AND CORONARY ANGIOGRAPHY;  Surgeon: Armando Reichert, MD;  Location: [facility] INVASIVE CV LAB;  Service: Cardiovascular;  Laterality: Left;   LOWER EXTREMITY ANGIOGRAPHY Right 11/18/2016    Procedure: Lower Extremity Angiography;  Surgeon: Renford Dills, MD;  Location: [facility] INVASIVE CV LAB;  Service: Cardiovascular;  Laterality: Right;   LOWER EXTREMITY ANGIOGRAPHY Right 12/09/2016   Procedure: Lower Extremity Angiography;  Surgeon: Renford Dills, MD;  Location: [facility] INVASIVE CV LAB;  Service: Cardiovascular;  Laterality: Right;   LOWER EXTREMITY ANGIOGRAPHY Right 11/15/2019   Procedure: LOWER EXTREMITY ANGIOGRAPHY;  Surgeon: Renford Dills, MD;  Location: [facility] INVASIVE CV LAB;  Service: Cardiovascular;  Laterality: Right;   LOWER EXTREMITY ANGIOGRAPHY Right 12/01/2019   Procedure: Lower Extremity Angiography;  Surgeon: Annice Needy, MD;  Location: [facility] INVASIVE CV LAB;  Service: Cardiovascular;  Laterality: Right;   LOWER EXTREMITY ANGIOGRAPHY Right 12/02/2019   Procedure: LOWER EXTREMITY ANGIOGRAPHY;  Surgeon: Renford Dills, MD;  Location: [facility] INVASIVE CV LAB;  Service: Cardiovascular;  Laterality: Right;   LOWER EXTREMITY ANGIOGRAPHY Right 03/23/2020   Procedure: Lower Extremity Angiography;  Surgeon: Renford Dills, MD;  Location: [facility] INVASIVE CV LAB;  Service: Cardiovascular;  Laterality: Right;   LOWER EXTREMITY ANGIOGRAPHY Right 09/25/2021   Procedure: Lower Extremity Angiography;  Surgeon: Renford Dills, MD;  Location: [facility] INVASIVE CV LAB;  Service: Cardiovascular;  Laterality: Right;   LOWER EXTREMITY ANGIOGRAPHY Right 09/26/2021   Procedure: Lower Extremity Angiography;  Surgeon: Annice Needy, MD;  Location: [facility] INVASIVE CV LAB;  Service: Cardiovascular;  Laterality: Right;   LOWER EXTREMITY ANGIOGRAPHY Right 04/08/2022   Procedure: Lower Extremity Angiography;  Surgeon: Renford Dills, MD;  Location: [facility] INVASIVE CV LAB;  Service: Cardiovascular;  Laterality: Right;   LOWER EXTREMITY INTERVENTION  11/18/2016   Procedure: Lower Extremity Intervention;  Surgeon: Renford Dills, MD;  Location: [facility] INVASIVE CV LAB;  Service: Cardiovascular;;    PERIPHERAL VASCULAR CATHETERIZATION Right 01/02/2015   Procedure: Lower Extremity Angiography;  Surgeon: Renford Dills, MD;  Location: [facility] INVASIVE CV LAB;  Service: Cardiovascular;  Laterality: Right;   PERIPHERAL VASCULAR CATHETERIZATION Right 06/18/2015   Procedure: Lower Extremity Angiography;  Surgeon: Annice Needy, MD;  Location: [facility] INVASIVE CV LAB;  Service: Cardiovascular;  Laterality: Right;   PERIPHERAL VASCULAR CATHETERIZATION  06/18/2015   Procedure: Lower Extremity Intervention;  Surgeon: Annice Needy, MD;  Location: [facility] INVASIVE CV LAB;  Service: Cardiovascular;;   PERIPHERAL VASCULAR CATHETERIZATION N/A 10/09/2015   Procedure: Abdominal Aortogram w/Lower Extremity;  Surgeon: Renford Dills, MD;  Location: [facility] INV[facility] CV LAB;  Service: Cardiovascular;  Laterality: N/A;   PERIPHERAL VASCULAR CATHETERIZATION  10/09/2015   Procedure: Lower Extremity Intervention;  Surgeon: Renford Dills, MD;  Location: [facility] INVASIVE CV LAB;  Service: Cardiovascular;;   PERIPHERAL VASCULAR CATHETERIZATION Right 10/10/2015   Procedure: Lower Extremity Angiography;  Surgeon: Annice Needy, MD;  Location: [facility] INVAS[facility] CV LAB;  Service: Cardiovascular;  Laterality: Right;   PERIPHERAL VASCULAR CATHETERIZATION Right 10/10/2015   Procedure: Lower Extremity Intervention;  Surgeon: Annice Needy, MD;  Location: [facility] INV[facility] LAB;  Service: Cardiovascular;  Laterality: Right;   PERIPHERAL VASCULAR CATHETERIZATION Right 12/03/2015   Procedure: Lower Extremity Angiography;  Surgeon: Annice Needy, MD;  Location: [facility] IN[facility];  Service: Cardiovascular;  Laterality: Right;   PERIPHERAL VASCULAR CATHETERIZATION Right 12/04/2015   Procedure: Lower Extremity Angiography;  Surgeon: Annice Needy, MD;  Location: [facility] IN[facility];  Service: Cardiovascular;  Laterality: Right;   PERIPHERAL VASCULAR CATHETERIZATION  12/04/2015   Procedure: Lower Extremity Intervention;  Surgeon: Annice Needy, MD;  Location:  [facility] INVASIVE CV LAB;  Service: Cardiovascular;;   PERIPHERAL VASCULAR CATHETERIZATION Right 06/30/2016   Procedure: Lower Extremity Angiography;  Surgeon: Annice Needy, MD;  Location: [facility] INVASIVE CV LAB;  Service: Cardiovascular;  Laterality: Right;   PERIPHERAL VASCULAR CATHETERIZATION Right 06/25/2016   Procedure: Lower Extremity Angiography;  Surgeon: Renford Dills, MD;  Location: [facility] INV[facility] LAB;  Service: Cardiovascular;  Laterality: Right;   PERIPHERAL VASCULAR CATHETERIZATION Right 07/01/2016   Procedure: Lower Extremity Angiography;  Surgeon: Renford Dills, MD;  Location: [facility] INV[facility] LAB;  Service: Cardiovascular;  Laterality: Right;   PERIPHERAL VASCULAR CATHETERIZATION  07/01/2016   Procedure: Lower Extremity Intervention;  Surgeon: Renford Dills, MD;  Location: [facility] INVASIVE CV LAB;  Service: Cardiovascular;;   PERIPHERAL VASCULAR CATHETERIZATION Right 08/01/2016   Procedure: Lower Extremity Angiography;  Surgeon: Renford Dills, MD;  Location: [facility] INVASIVE CV LAB;  Service: Cardiovascular;  Laterality: Right;   PORTA CATH INSERTION N/A 10/05/2020   Procedure: PORTA CATH INSERTION;  Surgeon: Renford Dills, MD;  Location: [facility] INVASIVE CV LAB;  Service: Cardiovascular;  Laterality: N/A;   stent placement in right leg Right    VIDEO BRONCHOSCOPY N/A 09/14/2020   Procedure: VIDEO BRONCHOSCOPY WITH FLUORO;  Surgeon: Salena Saner, MD;  Location: [facility] ORS;  Service: Cardiopulmonary;  Laterality: N/A;   VIDEO BRONCHOSCOPY WITH ENDOBRONCHIAL ULTRASOUND N/A 09/14/2020   Procedure: VIDEO BRONCHOSCOPY WITH ENDOBRONCHIAL ULTRASOUND;  Surgeon: Salena Saner, MD;  Location: [facility] ORS;  Service: Cardiopulmonary;  Laterality: N/A;    SOCIAL HISTORY: Social History   Socioeconomic History   Marital status: Single    Spouse name: Not on file   Number of children: Not on file   Years of education: Not on file   Highest education level: Not on file   Occupational History   Occupation: unemployed  Tobacco Use   Smoking status: Former    Current packs/day: 0.00    Average packs/day: 2.0 packs/day for 38.0 years (76.0 ttl pk-yrs)    Types: Cigarettes    Start date: 12/19/1976    Quit date: 12/20/2014    Years since quitting: 8.8   Smokeless tobacco: Never   Tobacco comments:    quit smoking in 2017 most smoked 2   Vaping Use   Vaping status: Former  Substance and Sexual Activity   Alcohol use: Not Currently   Drug use: No   Sexual activity: Yes  Other Topics Concern   Not on file  Social History Narrative   Not on file   Social Drivers of Health   Financial Resource Strain: Low Risk  (07/27/2023)   Received from [facility]   Overall Financial Resource Strain (CARDIA)    Difficulty of Paying Living Expenses: Not hard at all  Food Insecurity: No Food Insecurity (08/21/2023)   Hunger Vital Sign    Worried About Running Out of Food in the Last Year: Never true    Ran Out of Food in the Last Year: Never true  Transportation Needs: No Transportation Needs (08/21/2023)   PRAPARE - Transportation    Lack of Transportation (Medical): No    Lack of  Transportation (Non-Medical): No  Physical Activity: Insufficiently Active (05/29/2023)   Exercise Vital Sign    Days of Exercise per Week: 2 days    Minutes of Exercise per Session: 10 min  Stress: No Stress Concern Present (05/29/2023)   [employer] of Occupational Health - Occupational Stress Questionnaire    Feeling of Stress : Not at all  Social Connections: Socially Isolated (08/21/2023)   Social Connection and Isolation Panel [NHANES]    Frequency of Communication with Friends and Family: More than three times a week    Frequency of Social Gatherings with Friends and Family: Three times a week    Attends Religious Services: Never    Active Member of Clubs or Organizations: No    Attends Banker Meetings: Never    Marital Status: Never married   Intimate Partner Violence: Not At Risk (08/21/2023)   Humiliation, Afraid, Rape, and Kick questionnaire    Fear of Current or Ex-Partner: No    Emotionally Abused: No    Physically Abused: No    Sexually Abused: No    FAMILY HISTORY: Family History  Problem Relation Age of Onset   Diabetes Mother    Hypertension Mother    Heart murmur Mother    Leukemia Mother    Throat cancer Maternal Grandmother    Colon cancer Neg Hx    Breast cancer Neg Hx     ALLERGIES:  has no active allergies.  MEDICATIONS:  Current Outpatient Medications  Medication Sig Dispense Refill   aspirin EC 81 MG tablet Take 81 mg by mouth daily. Swallow whole.     clopidogrel (PLAVIX) 75 MG tablet TAKE 1 TABLET BY MOUTH DAILY 100 tablet 2   dexamethasone (DECADRON) 2 MG tablet Take 1 tablet (2 mg total) by mouth daily. 30 tablet 2   levETIRAcetam (KEPPRA) 1000 MG tablet Take 1 tablet (1,000 mg total) by mouth 2 (two) times daily. 60 tablet 2   levothyroxine (LEVOXYL) 125 MCG tablet Take 1 tablet (125 mcg total) by mouth daily before breakfast. 90 tablet 0   rosuvastatin (CRESTOR) 40 MG tablet Take 1 tablet (40 mg total) by mouth daily. 90 tablet 3   No current facility-administered medications for this visit.   Facility-Administered Medications Ordered in Other Visits  Medication Dose Route Frequency Provider Last Rate Last Admin   albuterol (PROVENTIL) (2.5 MG/3ML) 0.083% nebulizer solution 2.5 mg  2.5 mg Nebulization Once Salena Saner, MD       heparin lock flush 100 unit/mL  500 Units Intracatheter Once PRN [name], MD         PHYSICAL EXAMINATION: ECOG PERFORMANCE STATUS: 1 - Symptomatic but completely ambulatory Vitals:   10/07/23 1335  BP: 128/70  Pulse: 76  Resp: 18  Temp: 97.9 F (36.6 C)  SpO2: 100%    Filed Weights   10/07/23 1335  Weight: 219 lb 14.4 oz (99.7 kg)     Physical Exam Constitutional:      General: He is not in acute distress.    Appearance: He is obese.   HENT:     Head: Normocephalic and atraumatic.  Eyes:     General: No scleral icterus. Cardiovascular:     Rate and Rhythm: Normal rate and regular rhythm.  Pulmonary:     Effort: Pulmonary effort is normal. No respiratory distress.     Breath sounds: Normal breath sounds. No wheezing.  Abdominal:     General: There is no distension.     Palpations:  Abdomen is soft.     Tenderness: There is no abdominal tenderness.  Musculoskeletal:     Cervical back: Normal range of motion and neck supple.  Skin:    General: Skin is warm and dry.     Findings: No erythema.  Neurological:     Mental Status: He is alert and oriented to person, place, and time. Mental status is at baseline.     Cranial Nerves: No cranial nerve deficit.     Motor: No abnormal muscle tone.  Psychiatric:        Mood and Affect: Mood and affect normal.      LABORATORY DATA:  I have reviewed the data as listed    Latest Ref Rng & Units 10/07/2023    1:18 PM 09/23/2023    8:35 AM 09/09/2023    7:54 AM  CBC  WBC 4.0 - 10.5 K/uL 7.0  6.0  10.5   Hemoglobin 13.0 - 17.0 g/dL 47.8  29.5  62.1   Hematocrit 39.0 - 52.0 % 39.9  40.2  41.3   Platelets 150 - 400 K/uL 221  145  207       Latest Ref Rng & Units 10/07/2023    1:18 PM 09/23/2023    8:35 AM 09/09/2023    7:54 AM  CMP  Glucose 70 - 99 mg/dL 308  657  846   BUN 8 - 23 mg/dL 24  20  26    Creatinine 0.61 - 1.24 mg/dL 9.62  9.52  8.41   Sodium 135 - 145 mmol/L 138  136  140   Potassium 3.5 - 5.1 mmol/L 3.5  3.5  4.0   Chloride 98 - 111 mmol/L 105  101  104   CO2 22 - 32 mmol/L 25  28  26    Calcium 8.9 - 10.3 mg/dL 8.6  9.0  9.2   Total Protein 6.5 - 8.1 g/dL 6.7  6.7  6.5   Total Bilirubin 0.0 - 1.2 mg/dL 0.7  0.6  0.6   Alkaline Phos 38 - 126 U/L 62  75  88   AST 15 - 41 U/L 18  27  20    ALT 0 - 44 U/L 20  33  49      RADIOGRAPHIC STUDIES: I have personally reviewed the radiological images as listed and agreed with the findings in the report. MR BRAIN  W WO CONTRAST Result Date: 08/21/2023 CLINICAL DATA:  Metastatic disease evaluation. History of metastatic lung cancer. EXAM: MRI HEAD WITHOUT AND WITH CONTRAST TECHNIQUE: Multiplanar, multiecho pulse sequences of the brain and surrounding structures were obtained without and with intravenous contrast. CONTRAST:  10mL GADAVIST GADOBUTROL 1 MMOL/ML IV SOLN COMPARISON:  Head CT 08/20/2023 and MRI 08/05/2023 FINDINGS: Brain: Heterogeneously enhancing, partially necrotic lesions measuring up to 2.7 cm in the medial aspect of the left postcentral gyrus (series 11, image 47 and series 12, image 14) and 2.1 cm in the lateral aspect of the left precentral gyrus (series 11, image 33 and series 12, image 20) are stable to minimally larger than on the recent prior MRI. Extensive vasogenic edema in the left frontal and parietal lobes has mildly worsened from the prior MRI with increased rightward midline shift now measuring 7 mm. Mild dural enhancement along the left parietal convexity overlying the postcentral gyrus lesion is unchanged. A 1 cm peripherally enhancing lesion in the right occipital lobe is unchanged in size (series 11, image 23) with minimal surrounding edema which has slightly increased. A  punctate focus of faint residual enhancement corresponding to a known left cerebellar lesion is unchanged (series 11, image 13). No acute infarct or extra-axial fluid collection is evident. There are chronic blood products associated with the left cerebral lesions. Patchy T2 hyperintensities elsewhere in the cerebral white matter bilaterally are unchanged and nonspecific but compatible with mild-to-moderate chronic small vessel ischemic disease. There is partial effacement of the left lateral ventricle. There is no evidence of hydrocephalus. No new enhancing brain lesions are identified. Vascular: Major intracranial vascular flow voids are preserved. Skull and upper cervical spine: No suspicious marrow lesion. Sinuses/Orbits:  Unremarkable orbits. Mild mucosal thickening in the paranasal sinuses. Clear mastoid air cells. Other: None. IMPRESSION: 1. Stable to minimally increased size of 2 left cerebral metastases since 08/05/2023. Worsening, extensive vasogenic edema with 7 mm of rightward midline shift. 2. Unchanged size of small right occipital and left cerebellar metastases. 3. No new metastases. Electronically Signed   By: Sebastian Ache M.D.   On: 08/21/2023 10:30   Overnight EEG with video Result Date: 08/21/2023 Charlsie Quest, MD     08/22/2023  7:25 AM Patient Name: MOLLY MASELLI MRN: [id number] Epilepsy Attending: Charlsie Quest Referring Physician/Provider: Erick Blinks, MD Duration: 08/21/2023 0420 to 2304 Patient history: 68 y.o. 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  He was noted to have twitching of his right side concerning for a seizure. EEG to evaluate for seizure Level of alertness: Awake, asleep AEDs during EEG study: LEV Technical aspects: This EEG study was done with scalp electrodes positioned according to the 10-20 International system of electrode placement. Electrical activity was reviewed with band pass filter of 1-70Hz , sensitivity of 7 uV/mm, display speed of 49mm/sec with a 60Hz  notched filter applied as appropriate. EEG data were recorded continuously and digitally stored.  Video monitoring was available and reviewed as appropriate. Description: The posterior dominant rhythm consists of 9 Hz activity of moderate voltage (25-35 uV) seen predominantly in posterior head regions, symmetric and reactive to eye opening and eye closing. Sleep was characterized by vertex waves, sleep spindles (12 to 14 Hz), maximal frontocentral region. EEG  showed intermittent 3 to 5 Hz theta-delta slowing in left temporal region. Hyperventilation and photic stimulation were not performed.  EEG was disconnected between 0906 to 1001 and between 1554 to 1845 ABNORMALITY - Intermittent slow, left temporal region. IMPRESSION: This study is suggestive of cortical dysfunction arising from left temporal region likely secondary to underlying structural abnormality/ tumor. No seizures or epileptiform discharges were seen throughout the recording. Charlsie Quest   CT HEAD CODE STROKE WO CONTRAST Result Date: 08/20/2023 CLINICAL DATA:  Code stroke.  Neuro deficit, acute, stroke suspected EXAM: CT HEAD WITHOUT CONTRAST TECHNIQUE: Contiguous axial images were obtained from the base of the skull through the vertex without intravenous contrast. RADIATION DOSE REDUCTION: This exam was performed according to the departmental dose-optimization program which includes automated exposure control, adjustment of the mA and/or kV according to patient size and/or use of iterative reconstruction technique. COMPARISON:  None Available. FINDINGS: Motion limited study.  Within this limitation: Brain: Increasing edema in the left frontoparietal region. No definite new acute hemorrhage. Small focus of hyperdensity in the left frontal region likely represents mineralization or hemorrhage  with in the known metastasis, likely present on prior MRI. No obvious superimposed acute large vascular territory infarct. No hydrocephalus. Vascular: Not well assessed. Skull: No acute fracture. Sinuses/Orbits: Clear sinuses.  No acute orbital findings. Other: No mastoid effusions. IMPRESSION: Increasing left frontoparietal edema and mass effect with 9 mm of right midline shift, likely related to worsening metastatic disease given findings on recent MRI head. Repeat MRI head with contrast could confirm and better assess if clinically warranted. Code stroke imaging results were communicated on 08/20/2023 at 11:15  pm to provider [facility] via telephone, who verbally acknowledged these results. Electronically Signed   By: Feliberto Harts M.D.   On: 08/20/2023 23:20   MR BRAIN W WO CONTRAST Result Date: 08/05/2023 CLINICAL DATA:  Metastatic lung cancer, restaging EXAM: MRI HEAD WITHOUT AND WITH CONTRAST TECHNIQUE: Multiplanar, multiecho pulse sequences of the brain and surrounding structures were obtained without and with intravenous contrast. CONTRAST:  9mL GADAVIST GADOBUTROL 1 MMOL/ML IV SOLN COMPARISON:  07/03/2023 FINDINGS: Brain: Redemonstrated peripherally enhancing mass in the medial left postcentral gyrus, which measures up to 2.4 x 2.2 x 1.8 cm (AP x TR x CC) (series 18, image 26 and series 16, image 143), previously 2.0 x 2.1 x 1.8 cm when remeasured similarly. Increased associated T2 hyperintense signal (series 9, image 59). Enhancing mass in the left precentral gyrus measures 1.6 x 1.3 x 1.7 cm (AP x TR x CC) (series 16, image 112 and series 18, image 32), previously 1.5 x 1.3 x 1.8 cm, overall unchanged. Unchanged surrounding T2 hyperintense signal. Minimal peripheral enhancement about the lesion in the right occipital lobe, which measures up to 0.9 x 0.8 x 0.6 cm (AP x TR x CC) (series 16, image 62 and series 18, image 12), previously 1.0 x 1.1 x 0.8 cm. Decreased surrounding T2 hyperintense signal. Diminishing enhancement in a previously noted left cerebellar lesion (series 16, image 43). No new abnormal enhancement. No acute infarct, hemorrhage, or midline shift. No hydrocephalus or extra-axial collection. Vascular: Normal arterial flow voids. Normal arterial and venous enhancement. Skull and upper cervical spine: Normal marrow signal. Sinuses/Orbits: Clear paranasal sinuses. No acute finding in the orbits. Other: The mastoid air cells are well aerated. IMPRESSION: 1. Slight interval increase in size of the peripherally enhancing mass in the medial left postcentral gyrus, with increased associated T2  hyperintense signal. 2. Unchanged size of the enhancing mass in the left precentral gyrus. 3. Decreased size of the enhancing lesion in the right occipital lobe, with decreased surrounding T2 hyperintense signal. 4. Diminishing enhancement in a previously noted left cerebellar lesion. 5. No new abnormal enhancement. Electronically Signed   By: Wiliam Ke M.D.   On: 08/05/2023 14:50

## 2023-10-09 ENCOUNTER — Ambulatory Visit
Admission: RE | Admit: 2023-10-09 | Discharge: 2023-10-09 | Disposition: A | Discharge status: Home / Self Care | Payer: HMO | Source: Ambulatory Visit | Attending: Internal Medicine | Admitting: Internal Medicine

## 2023-10-09 DIAGNOSIS — C7931 Secondary malignant neoplasm of brain: Secondary | ICD-10-CM

## 2023-10-09 DIAGNOSIS — G936 Cerebral edema: Secondary | ICD-10-CM | POA: Diagnosis not present

## 2023-10-09 DIAGNOSIS — C3490 Malignant neoplasm of unspecified part of unspecified bronchus or lung: Secondary | ICD-10-CM | POA: Diagnosis not present

## 2023-10-09 MED ORDER — HEPARIN SOD (PORK) LOCK FLUSH 100 UNIT/ML IV SOLN
INTRAVENOUS | Status: AC
  Filled 2023-10-09: qty 5

## 2023-10-09 MED ORDER — HEPARIN SOD (PORK) LOCK FLUSH 100 UNIT/ML IV SOLN
500.0000 [IU] | Freq: Once | INTRAVENOUS | Status: AC
  Administered 2023-10-09: 500 [IU] via INTRAVENOUS
  Filled 2023-10-09: qty 5

## 2023-10-09 MED ORDER — GADOBUTROL 1 MMOL/ML IV SOLN
10.0000 mL | Freq: Once | INTRAVENOUS | Status: AC | PRN
  Administered 2023-10-09: 10 mL via INTRAVENOUS

## 2023-10-12 ENCOUNTER — Telehealth: Payer: Self-pay | Admitting: Oncology

## 2023-10-12 NOTE — Telephone Encounter (Signed)
 Patient called to clarify- he thought you were changing the order for CT to Ct chest abdomin and pelvis. He would like a call back to let him know. [phone].

## 2023-10-14 ENCOUNTER — Ambulatory Visit
Admission: RE | Admit: 2023-10-14 | Discharge: 2023-10-14 | Disposition: A | Discharge status: Home / Self Care | Source: Ambulatory Visit | Attending: Oncology | Admitting: Oncology

## 2023-10-14 DIAGNOSIS — J8410 Pulmonary fibrosis, unspecified: Secondary | ICD-10-CM | POA: Diagnosis not present

## 2023-10-14 DIAGNOSIS — J439 Emphysema, unspecified: Secondary | ICD-10-CM | POA: Diagnosis not present

## 2023-10-14 DIAGNOSIS — C3491 Malignant neoplasm of unspecified part of right bronchus or lung: Secondary | ICD-10-CM | POA: Diagnosis not present

## 2023-10-14 DIAGNOSIS — K5730 Diverticulosis of large intestine without perforation or abscess without bleeding: Secondary | ICD-10-CM | POA: Diagnosis not present

## 2023-10-14 MED ORDER — IOHEXOL 300 MG/ML  SOLN
100.0000 mL | Freq: Once | INTRAMUSCULAR | Status: AC | PRN
  Administered 2023-10-14: 100 mL via INTRAVENOUS

## 2023-10-14 MED ORDER — HEPARIN SOD (PORK) LOCK FLUSH 100 UNIT/ML IV SOLN
INTRAVENOUS | Status: AC
  Filled 2023-10-14: qty 5

## 2023-10-14 MED ORDER — HEPARIN SOD (PORK) LOCK FLUSH 100 UNIT/ML IV SOLN
500.0000 [IU] | Freq: Once | INTRAVENOUS | Status: AC
  Administered 2023-10-14: 500 [IU] via INTRAVENOUS
  Filled 2023-10-14: qty 5

## 2023-10-16 ENCOUNTER — Encounter: Payer: Self-pay | Admitting: Internal Medicine

## 2023-10-16 ENCOUNTER — Inpatient Hospital Stay (HOSPITAL_BASED_OUTPATIENT_CLINIC_OR_DEPARTMENT_OTHER): Payer: HMO | Admitting: Internal Medicine

## 2023-10-16 ENCOUNTER — Other Ambulatory Visit: Payer: Self-pay

## 2023-10-16 VITALS — BP 131/66 | HR 69 | Temp 97.6°F | Resp 20 | Wt 221.5 lb

## 2023-10-16 DIAGNOSIS — C7931 Secondary malignant neoplasm of brain: Secondary | ICD-10-CM

## 2023-10-16 DIAGNOSIS — Z5112 Encounter for antineoplastic immunotherapy: Secondary | ICD-10-CM | POA: Diagnosis not present

## 2023-10-16 MED ORDER — DEXAMETHASONE 4 MG PO TABS
4.0000 mg | ORAL_TABLET | Freq: Two times a day (BID) | ORAL | 0 refills | Status: DC
  Filled 2023-10-16: qty 60, 30d supply, fill #0

## 2023-10-16 NOTE — Progress Notes (Signed)
 [facility] [address]  [place], [place] [address] [phone]   Interval Evaluation  Date of Service: 10/16/23 Patient Name: TYRANN DONAHO Patient MRN: [id number] Patient DOB: 1955/09/30 Provider: Henreitta Leber, MD  Identifying Statement:  Joshua [name] is a 68 y.o. male with Malignant neoplasm metastatic to brain [facility])    Primary Cancer:  Oncologic History: Oncology History  Squamous cell carcinoma of right lung (HCC)  09/18/2020 Imaging   PET scan showed right upper lobe nodule maximal SUV 11.5.  Hypermetabolic cluster right paratracheal adenopathy with maximum SUV up to 12.  No findings of metastatic disease to the neck/abdomen/pelvis or skeleton.   09/20/2020 Initial Diagnosis   Squamous cell carcinoma of right lung (HCC) NGS showed PD-L1 TPS 95%, LRP1B S933fs, PIK3CA E545K, RB1 c1422-2A>T, RIT1 T83_ A84del, STK11 P256fs, TPS53 R248W, TMB 19.32mut/mb, MS stable.    09/20/2020 Cancer Staging   Staging form: Lung, AJCC 8th Edition - Clinical stage from 09/20/2020: Stage IV (cT1b, cN2, cM1) - Signed by [name], MD on 12/13/2021   10/03/2020 Imaging   MRI brain with and without contrast showed no evidence of intracranial metastatic disease. Well-defined round lesion within the left parotid tail measuring approximately 2.3 x 1.8 x 1.8 cm   10/09/2020 - 11/27/2020 Radiation Therapy   concurrent chemoradiation   10/10/2020 - 10/31/2020 Chemotherapy   10/10/2020 concurrent chemoradiation.  carboplatin AUC of 2 and Taxol 45 mg/m2 x 4.  Additional chemotherapy was held due to thrombocytopenia, neutropenia       12/31/2020 - 03/21/2022 Chemotherapy   Maintenance  Durvalumab q14d      12/31/2020 -  Chemotherapy   Patient is on Treatment Plan : LUNG Durvalumab (10) q14d     03/28/2021 Imaging   CT without contrast showed new linear right perihilar consolidation with associated traction bronchiectasis.  Likely postradiation changes.   Additional new peribronchial vascular/nodular groundglass opacities with areas of consolidation or seen primarily in the right upper and lower lobes, more distant from expected radiation fields. 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  Per my discussion with pulmonology Dr. 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     05/09/2021 Imaging   Repeat CT chest without contrast showed evolving radiation changes in the right perihilar region likely accounting for interval increase to thickening of the posterior wall of the right upper lobe bronchus.  The more peripheral right lung groundglass opacities seen on most recent study have generally improved with exception of one peripherally right upper lobe which appears new.  These are likely inflammatory.  No findings highly suspicious for local recurrence.   05/30/2021 - 01/10/2022 Chemotherapy   Durvalumab q14d     10/07/2021 Imaging   MRI brain with and without contrast showed 2 contrast-enhancing lesions, most consistent with metastatic disease.  Largest lesion is in the left frontal lobe and measures up to 1.3 cm with a large amount of surrounding edema and 9 mm rightward midline shift.  The other enhancing lesion was 4 mm in the right occipital lobe with a small amount of surrounding edema.  No acute or chronic hemorrhage.   10/07/2021 Progression   Headache after colonoscopy, progressive difficulty using her right arm.  Progressively worsening of speech. Stage IV lung cancer with brain metastasis.  started on Dexamethasone.  He was seen by neurosurgery and radiation oncology.  Recommendation is  to proceed with SRS.  Patient was discharged with dexamethasone   10/16/2021 Imaging   PET scan showed postradiation changes in the right supra hilar lung without evidence of local recurrence.  No evidence of lung cancer recurrence  or metastatic disease.  Single focus of intense metabolic activity associated with posterior left 11th rib likely secondary to recent fall/posttraumatic metabolic changes.  Frontal  uptake in the left frontal lobe corresponding to the known metastatic lesion in the brain   10/29/2021 -  Radiation Therapy   Brain [facility]   12/10/2021 Imaging   Brain MRI: Continued interval decrease in size of a left frontal lobe metastasis, now measuring 10 mm. Moderate surrounding edema has also decreased.  3-4 mm metastasis within the right occipital lobe, not significantly changed in size. There is now a small focus of central non enhancement within this lesion. Increased edema. 3 mm enhancing metastasis within the high left parietal lobe. In retrospect, this was likely present as a subtle punctate focus of enhancement on the prior brain MRI of 10/22/2021.   01/18/2022 Imaging   CT chest abdomen pelvis w contrast 1. Stable post treatment changes in the right upper lobe medially with dense radiation fibrosis but no findings to suggest local recurrent tumor. 2. Significant progression of ill-defined ground-glass opacity, interstitial thickening and airspace nodularity in the right upper lobe and upper aspect of the right lower lobe. Findings could reflect an inflammatory or atypical infectious process, drug induced pneumonitis or interstitial spread of tumor.3. No mediastinal or hilar mass or adenopathy. 4. No findings for abdominal/pelvic metastatic disease or osseous metastatic disease.   02/19/2022 Imaging   MRI brain w wo contrast showed Three metastatic deposits in the brain appear smaller. Decreased edema in the left parietal lobe. No new lesions    04/04/2022 Imaging   CT chest w contrast 1. Stable radiation changes in the right perihilar region without evidence of local recurrence. 2. Interval improvement in the patchy ground-glass opacities previously demonstrated in both lungs, most consistent with a  resolving infectious or inflammatory process (including immunotherapy-related pneumonitis). 3. No evidence of metastatic disease.   05/14/2022 Miscellaneous   He had a mechanical fall and fractured his left elbow. xrays showed a left elbow fracture  05/26/22 seen by orthopedic surgeon, cast applied.    05/27/2022 Imaging   MRI brain w wo contrast 1. All three known brain metastases have enlarged since August, with increasing regional edema. And there is a small new left anterior cerebellar 4-5 mm metastasis (was questionably punctate on the May MRI). This constellation could reflect a combination of True Progression (Left Cerebellar lesion) and pseudoprogression/radionecrosis (3 existing mets).    2. No significant intracranial mass effect. 3. Underlying moderate cerebral white matter signal changes.   05/30/2022 Imaging   CT chest abdomen pelvis w contrast  1. 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 There is a stable small right pleural effusion, but no definitive findings to suggest residual or recurrent disease on today's examination. 2. No signs of metastatic disease in the abdomen or pelvis.  3. Aortic atherosclerosis, in addition to two-vessel coronary artery disease. Please note that although the presence of coronary artery calcium documents the presence of coronary artery disease, the severity of this disease and any potential stenosis cannot be assessed on this non-gated CT examination. Assessment for potential risk factor modification, dietary therapy or pharmacologic therapy may be warranted, if clinically indicated.  4. Hepatic steatosis. 5. Colonic diverticulosis without evidence of acute diverticulitis at this time. 6. Incompletely imaged but apparently occluded vascular graft in the superior aspect of the right anterior thigh. Referral to vascular specialist  for further clinical evaluation should be considered if clinically appropriate. 7. Additional incidental findings, as above.   07/22/2022 Imaging   MRI brain  1. No new or enlarging lesions. No intracranial disease progression. 2. Decreased size of treated lesions of the left frontoparietal junction and left postcentral gyrus. 3. Unchanged lesions in the left cerebellum and right occipital lobe.   10/15/2022 Imaging   MRI brain w wo contrast showed Interval increase in size of multiple intraparenchymal lesions with worsened surrounding vasogenic edema. 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 No new lesions   11/05/2022 Imaging   MRI brain w wo contrast  1. Likely slight increase and right occipital and left cerebellar lesions. Otherwise, unchanged metastases, as above. No new lesions identified. 2. Partially imaged large probable lymph node in the left upper neck, apparent on prior PET CT.   11/13/2022 -  Radiation Therapy   Each of the sites were treated with SRS/SBRT/SRT-IMRT technique to 20 Gy in 1 fraction to each of the following sites: PTV_2_LCerebel_6mm PTV_3_RCerebel_18mm PTV_4_ROccip_81mm PTV_5_LParietal_71mm   11/27/2022 Imaging   CT chest abdomen pelvis w contrast showed  Improving bilateral scattered interstitial and ground-glass opacities. Small amount of residual areas in the extreme left lower lobe inferiorly. This specific area is slightly increased from previous but overall the amount of opacities is significantly improved. Recommend continued follow-up. No developing new mass lesion, fluid collection or lymph node enlargement. Stable posttreatment changes right perihilar in the lung. Fatty liver infiltration. Colonic diverticulosis. Improved tiny right pleural effusion   02/05/2023 Imaging   MRI brain with and without contrast Interval decrease in all of the metastatic lesions, detailed above.No  new lesions identified.    Imaging   CT chest abdomen pelvis w contrast showed  1. 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 2. Trace right pleural fluid or thickening, new or minimally increased. 3. Incidental findings, including: Aortic atherosclerosis (ICD10-I70.0), coronary artery atherosclerosis and emphysema (ICD10-J43.9). Hepatic steatosis.   06/25/2023 Imaging   CT chest abdomen pelvis w contrast showed 1. Unchanged post treatment/post radiation appearance of the right lung, with perihilar and suprahilar paramedian fibrosis and consolidation. 2. No evidence of lymphadenopathy or metastatic disease in the chest, abdomen, or pelvis. 3. Hepatic steatosis. 4. Coronary artery disease.   Aortic Atherosclerosis (ICD10-I70.0) and Emphysema (ICD10-J43.9).   08/26/2023 [facility] Admission   hospitalization due to was observed speech and jerking movements on right side despite being on dexamethasone. Continuous EEG suggest cortical dysfunction arising from left temporal region.  No seizure or elective form discharges were seen. Patient was discharged on dexamethasone 4 mg, Keppra, lamotrigine.     CNS Oncologic History 10/29/21: SRS to left frontal metastasis ([place]) 11/13/22: salvage SRS x5 with Dr. Mitzi Hansen  Interval History: [name] presents today for follow up after recent MRI brain.  He describes worsening weakness of the right arm and leg today.  He is dragging the leg more, though still able to ambulate with the cane.  He feels his arm is "disconnected from his brain".  Still dosing Keppra 1000mg  BID, his decadron currently at 2mg  daily.  No further seizure events. He complains of mood swings and irritability.  Remains on Imfinzi  immunotherapy with Dr. Cathie Hoops.  H+P (06/20/22) Patient presents today for evaluation for neurologic complaints.  He describes gradual onset of impaired speech output and right arm  greater than right leg weakness.  This led to a fall and an injury to his left arm.  He was started on decadron two weeks ago, this has led to considerable improvement in symptoms.  Today he feels at his prior baseline.  No issues tolerating the steroids.  He is due for durvalumab infusion today with Dr. Cathie Hoops.   Medications: Current Outpatient Medications on File Prior to Visit  Medication Sig Dispense Refill   aspirin EC 81 MG tablet Take 81 mg by mouth daily. Swallow whole.     clopidogrel (PLAVIX) 75 MG tablet TAKE 1 TABLET BY MOUTH DAILY 100 tablet 2   dexamethasone (DECADRON) 2 MG tablet Take 1 tablet (2 mg total) by mouth daily. 30 tablet 2   levETIRAcetam (KEPPRA) 1000 MG tablet Take 1 tablet (1,000 mg total) by mouth 2 (two) times daily. 60 tablet 2   levothyroxine (LEVOXYL) 125 MCG tablet Take 1 tablet (125 mcg total) by mouth daily before breakfast. 90 tablet 0   rosuvastatin (CRESTOR) 40 MG tablet Take 1 tablet (40 mg total) by mouth daily. 90 tablet 3   Current Facility-Administered Medications on File Prior to Visit  Medication Dose Route Frequency Provider Last Rate Last Admin   albuterol (PROVENTIL) (2.5 MG/3ML) 0.083% nebulizer solution 2.5 mg  2.5 mg Nebulization Once Salena Saner, MD       heparin lock flush 100 unit/mL  500 Units Intracatheter Once PRN [name], MD        Allergies: No Known Allergies Past Medical History:  Past Medical History:  Diagnosis Date   Allergy    Cancer (HCC)    lung   COPD (chronic obstructive pulmonary disease) (HCC)    Dyspnea    former smoker. 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   Malignant neoplasm metastatic to brain [facility])    Peripheral vascular disease (HCC)    Pre-diabetes    Sleep apnea    Past Surgical History:  Past Surgical History:  Procedure Laterality Date   ANTERIOR CERVICAL DECOMP/DISCECTOMY FUSION N/A 03/14/2020   Procedure: ANTERIOR CERVICAL  DECOMPRESSION/DISCECTOMY FUSION 3 LEVELS C4-7;  Surgeon: Venetia Night, MD;  Location: [facility] ORS;  Service: Neurosurgery;  Laterality: N/A;   BACK SURGERY  2011   CENTRAL LINE INSERTION Right 09/10/2016   Procedure: CENTRAL LINE INSERTION;  Surgeon: Renford Dills, MD;  Location: [facility] ORS;  Service: Vascular;  Laterality: Right;   CERVICAL FUSION     C4-c7 on Mar 14, 2020   COLONOSCOPY  2013 ?   COLONOSCOPY N/A 09/09/2021   Procedure: COLONOSCOPY;  Surgeon: Jaynie Collins, DO;  Location: [facility] ENDOSCOPY;  Service: Gastroenterology;  Laterality: N/A;   CORONARY ANGIOPLASTY     ESOPHAGOGASTRODUODENOSCOPY N/A 09/09/2021   Procedure: ESOPHAGOGASTRODUODENOSCOPY (EGD);  Surgeon: Jaynie Collins, DO;  Location: [facility] ENDOSCOPY;  Service: Gastroenterology;  Laterality: N/A;   FEMORAL-POPLITEAL BYPASS GRAFT Right 09/10/2016   Procedure: BYPASS GRAFT FEMORAL-POPLITEAL ARTERY ( BELOW KNEE );  Surgeon: Renford Dills, MD;  Location: [facility] ORS;  Service: Vascular;  Laterality: Right;   FEMORAL-TIBIAL BYPASS GRAFT Right 03/23/2020   Procedure: BYPASS GRAFT RIGHT FEMORAL- DISTAL TIBIAL ARTERY WITH DISTAL FLOW GRAFT;  Surgeon: Renford Dills, MD;  Location: [facility] ORS;  Service: Vascular;  Laterality: Right;   LEFT HEART CATH AND CORONARY ANGIOGRAPHY Left 05/16/2021   Procedure: LEFT HEART CATH AND CORONARY ANGIOGRAPHY;  Surgeon: Armando Reichert, MD;  Location: [facility] INVASIVE CV LAB;  Service: Cardiovascular;  Laterality: Left;   LOWER EXTREMITY ANGIOGRAPHY Right 11/18/2016   Procedure: Lower Extremity Angiography;  Surgeon: Renford Dills, MD;  Location: [facility] INVASIVE CV LAB;  Service: Cardiovascular;  Laterality: Right;   LOWER EXTREMITY ANGIOGRAPHY Right 12/09/2016   Procedure: Lower Extremity Angiography;  Surgeon: Renford Dills, MD;  Location: [facility] INVASIVE CV LAB;  Service: Cardiovascular;  Laterality: Right;   LOWER EXTREMITY ANGIOGRAPHY Right 11/15/2019   Procedure: LOWER  EXTREMITY ANGIOGRAPHY;  Surgeon: Renford Dills, MD;  Location: [facility] INVASIVE CV LAB;  Service: Cardiovascular;  Laterality: Right;   LOWER EXTREMITY ANGIOGRAPHY Right 12/01/2019   Procedure: Lower Extremity Angiography;  Surgeon: Annice Needy, MD;  Location: [facility] INVASIVE CV LAB;  Service: Cardiovascular;  Laterality: Right;   LOWER EXTREMITY ANGIOGRAPHY Right 12/02/2019   Procedure: LOWER EXTREMITY ANGIOGRAPHY;  Surgeon: Renford Dills, MD;  Location: [facility] INVASIVE CV LAB;  Service: Cardiovascular;  Laterality: Right;   LOWER EXTREMITY ANGIOGRAPHY Right 03/23/2020   Procedure: Lower Extremity Angiography;  Surgeon: Renford Dills, MD;  Location: [facility] INVASIVE CV LAB;  Service: Cardiovascular;  Laterality: Right;   LOWER EXTREMITY ANGIOGRAPHY Right 09/25/2021   Procedure: Lower Extremity Angiography;  Surgeon: Renford Dills, MD;  Location: [facility] INVASIVE CV LAB;  Service: Cardiovascular;  Laterality: Right;   LOWER EXTREMITY ANGIOGRAPHY Right 09/26/2021   Procedure: Lower Extremity Angiography;  Surgeon: Annice Needy, MD;  Location: [facility] INVASIVE CV LAB;  Service: Cardiovascular;  Laterality: Right;   LOWER EXTREMITY ANGIOGRAPHY Right 04/08/2022   Procedure: Lower Extremity Angiography;  Surgeon: Renford Dills, MD;  Location: [facility] INVASIVE CV LAB;  Service: Cardiovascular;  Laterality: Right;   LOWER EXTREMITY INTERVENTION  11/18/2016   Procedure: Lower Extremity Intervention;  Surgeon: Renford Dills, MD;  Location: [facility] INVASIVE CV LAB;  Service: Cardiovascular;;   PERIPHERAL VASCULAR CATHETERIZATION Right 01/02/2015   Procedure: Lower Extremity Angiography;  Surgeon: Renford Dills, MD;  Location: [facility] INVASIVE CV LAB;  Service: Cardiovascular;  Laterality: Right;   PERIPHERAL VASCULAR CATHETERIZATION Right 06/18/2015   Procedure: Lower Extremity Angiography;  Surgeon: Annice Needy, MD;  Location: [facility] INVASIVE CV LAB;  Service: Cardiovascular;  Laterality: Right;   PERIPHERAL  VASCULAR CATHETERIZATION  06/18/2015   Procedure: Lower Extremity Intervention;  Surgeon: Annice Needy, MD;  Location: [facility] INVASIVE CV LAB;  Service: Cardiovascular;;   PERIPHERAL VASCULAR CATHETERIZATION N/A 10/09/2015   Procedure: Abdominal Aortogram w/Lower Extremity;  Surgeon: Renford Dills, MD;  Location: [facility] INV[facility] LAB;  Service: Cardiovascular;  Laterality: N/A;   PERIPHERAL VASCULAR CATHETERIZATION  10/09/2015   Procedure: Lower Extremity Intervention;  Surgeon: Renford Dills, MD;  Location: [facility] INV[facility]IVE [facility] LAB;  Service: Cardiovascular;;   PERIPHERAL VASCULAR CATHETERIZATION Right 10/10/2015   Procedure: Lower Extremity Angiography;  Surgeon: Annice Needy, MD;  Location: [facility] IN[facility] LAB;  Service: Cardiovascular;  Laterality: Right;   PERIPHERAL VASCULAR CATHETERIZATION Right 10/10/2015   Procedure: Lower Extremity Intervention;  Surgeon: Annice Needy, MD;  Location: [facility];  Service: Cardiovascular;  Laterality: Right;   PERIPHERAL VASCULAR CATHETERIZATION Right 12/03/2015   Procedure: Lower Extremity Angiography;  Surgeon: Annice Needy, MD;  Location: [facility];  Service: Cardiovascular;  Laterality: Right;   PERIPHERAL VASCULAR CATHETERIZATION Right 12/04/2015  Procedure: Lower Extremity Angiography;  Surgeon: Annice Needy, MD;  Location: [facility] IN[facility] LAB;  Service: Cardiovascular;  Laterality: Right;   PERIPHERAL VASCULAR CATHETERIZATION  12/04/2015   Procedure: Lower Extremity Intervention;  Surgeon: Annice Needy, MD;  Location: [facility] IN[facility] LAB;  Service: Cardiovascular;;   PERIPHERAL VASCULAR CATHETERIZATION Right 06/30/2016   Procedure: Lower Extremity Angiography;  Surgeon: Annice Needy, MD;  Location: [facility] INV[facility] LAB;  Service: Cardiovascular;  Laterality: Right;   PERIPHERAL VASCULAR CATHETERIZATION Right 06/25/2016   Procedure: Lower Extremity Angiography;  Surgeon: Renford Dills, MD;  Location: [facility] INV[facility] LAB;  Service:  Cardiovascular;  Laterality: Right;   PERIPHERAL VASCULAR CATHETERIZATION Right 07/01/2016   Procedure: Lower Extremity Angiography;  Surgeon: Renford Dills, MD;  Location: [facility] IN[facility];  Service: Cardiovascular;  Laterality: Right;   PERIPHERAL VASCULAR CATHETERIZATION  07/01/2016   Procedure: Lower Extremity Intervention;  Surgeon: Renford Dills, MD;  Location: [facility] INV[facility] LAB;  Service: Cardiovascular;;   PERIPHERAL VASCULAR CATHETERIZATION Right 08/01/2016   Procedure: Lower Extremity Angiography;  Surgeon: Renford Dills, MD;  Location: [facility] INV[facility] CV LAB;  Service: Cardiovascular;  Laterality: Right;   PORTA CATH INSERTION N/A 10/05/2020   Procedure: PORTA CATH INSERTION;  Surgeon: Renford Dills, MD;  Location: [facility] INVASIVE CV LAB;  Service: Cardiovascular;  Laterality: N/A;   stent placement in right leg Right    VIDEO BRONCHOSCOPY N/A 09/14/2020   Procedure: VIDEO BRONCHOSCOPY WITH FLUORO;  Surgeon: Salena Saner, MD;  Location: [facility] ORS;  Service: Cardiopulmonary;  Laterality: N/A;   VIDEO BRONCHOSCOPY WITH ENDOBRONCHIAL ULTRASOUND N/A 09/14/2020   Procedure: VIDEO BRONCHOSCOPY WITH ENDOBRONCHIAL ULTRASOUND;  Surgeon: Salena Saner, MD;  Location: [facility] ORS;  Service: Cardiopulmonary;  Laterality: N/A;   Social History:  Social History   Socioeconomic History   Marital status: Single    Spouse name: Not on file   Number of children: Not on file   Years of education: Not on file   Highest education level: Not on file  Occupational History   Occupation: unemployed  Tobacco Use   Smoking status: Former    Current packs/day: 0.00    Average packs/day: 2.0 packs/day for 38.0 years (76.0 ttl pk-yrs)    Types: Cigarettes    Start date: 12/19/1976    Quit date: 12/20/2014    Years since quitting: 8.8   Smokeless tobacco: Never   Tobacco comments:    quit smoking in 2017 most smoked 2   Vaping Use   Vaping status: Former  Substance and Sexual  Activity   Alcohol use: Not Currently   Drug use: No   Sexual activity: Yes  Other Topics Concern   Not on file  Social History Narrative   Not on file   Social Drivers of Health   Financial Resource Strain: Low Risk  (07/27/2023)   Received from [facility]   Overall Financial Resource Strain (CARDIA)    Difficulty of Paying Living Expenses: Not hard at all  Food Insecurity: No Food Insecurity (08/21/2023)   Hunger Vital Sign    Worried About Running Out of Food in the Last Year: Never true    Ran Out of Food in the Last Year: Never true  Transportation Needs: No Transportation Needs (08/21/2023)   PRAPARE - Administrator, Civil Service (Medical): No    Lack of Transportation (Non-Medical): No  Physical Activity: Insufficiently Active (05/29/2023)   Exercise Vital Sign  Days of Exercise per Week: 2 days    Minutes of Exercise per Session: 10 min  Stress: No Stress Concern Present (05/29/2023)   [employer] of Occupational Health - Occupational Stress Questionnaire    Feeling of Stress : Not at all  Social Connections: Socially Isolated (08/21/2023)   Social Connection and Isolation Panel [NHANES]    Frequency of Communication with Friends and Family: More than three times a week    Frequency of Social Gatherings with Friends and Family: Three times a week    Attends Religious Services: Never    Active Member of Clubs or Organizations: No    Attends Banker Meetings: Never    Marital Status: Never married  Intimate Partner Violence: Not At Risk (08/21/2023)   Humiliation, Afraid, Rape, and Kick questionnaire    Fear of Current or Ex-Partner: No    Emotionally Abused: No    Physically Abused: No    Sexually Abused: No   Family History:  Family History  Problem Relation Age of Onset   Diabetes Mother    Hypertension Mother    Heart murmur Mother    Leukemia Mother    Throat cancer Maternal Grandmother    Colon cancer Neg Hx     Breast cancer Neg Hx     Review of Systems: Constitutional: Doesn't report fevers, chills or abnormal weight loss Eyes: Doesn't report blurriness of vision Ears, nose, mouth, throat, and face: Doesn't report sore throat Respiratory: Doesn't report cough, dyspnea or wheezes Cardiovascular: Doesn't report palpitation, chest discomfort  Gastrointestinal:  Doesn't report nausea, constipation, diarrhea GU: Doesn't report incontinence Skin: Doesn't report skin rashes Neurological: Per HPI Musculoskeletal: Doesn't report joint pain Behavioral/Psych: Doesn't report anxiety  Physical Exam: Vitals:   10/16/23 1121  BP: 131/66  Pulse: 69  Resp: 20  Temp: 97.6 F (36.4 C)  SpO2: 100%    KPS: 70. General: Alert, cooperative, pleasant, in no acute distress Head: Normal EENT: No conjunctival injection or scleral icterus.  Lungs: Resp effort normal Cardiac: Regular rate Abdomen: Non-distended abdomen Skin: No rashes cyanosis or petechiae. Extremities: Left arm in sling  Neurologic Exam: Mental Status: Awake, alert, attentive to examiner. Oriented to self and environment. Language is fluent with intact comprehension.  Cranial Nerves: Visual acuity is grossly normal. Visual fields are full. Extra-ocular movements intact. No ptosis. Face is symmetric Motor: Tone and bulk are normal. Power is 4+/5 in right arm and leg, with some impairment in fine motor function in right hand. Reflexes are symmetric, no pathologic reflexes present.  Sensory: Intact to light touch Gait: Hemiparetic   Labs: I have reviewed the data as listed    Component Value Date/Time   NA 138 10/07/2023 1318   NA 138 06/02/2023 1049   K 3.5 10/07/2023 1318   CL 105 10/07/2023 1318   CO2 25 10/07/2023 1318   GLUCOSE 128 (H) 10/07/2023 1318   BUN 24 (H) 10/07/2023 1318   BUN 18 06/02/2023 1049   BUN 16 07/03/2014 1205   CREATININE 0.90 10/07/2023 1318   CREATININE 1.11 11/06/2022 1041   CREATININE 1.34 (H)  07/03/2014 1205   CALCIUM 8.6 (L) 10/07/2023 1318   PROT 6.7 10/07/2023 1318   PROT 7.0 04/16/2020 1525   ALBUMIN 3.6 10/07/2023 1318   ALBUMIN 4.3 04/16/2020 1525   AST 18 10/07/2023 1318   ALT 20 10/07/2023 1318   ALKPHOS 62 10/07/2023 1318   BILITOT 0.7 10/07/2023 1318   BILITOT 0.4 04/16/2020 1525  GFRNONAA >60 10/07/2023 1318   GFRNONAA >60 11/06/2022 1041   GFRNONAA 59 (L) 07/03/2014 1205   GFRAA >60 03/30/2020 0536   GFRAA >60 07/03/2014 1205   Lab Results  Component Value Date   WBC 7.0 10/07/2023   NEUTROABS 5.5 10/07/2023   HGB 12.8 (L) 10/07/2023   HCT 39.9 10/07/2023   MCV 86.7 10/07/2023   PLT 221 10/07/2023    Imaging:  [facility] Clinician Interpretation: I have personally reviewed the CNS images as listed.  My interpretation, in the context of the patient's clinical presentation, is treatment effect vs true progression   MR BRAIN W WO CONTRAST Result Date: 10/09/2023 CLINICAL DATA:  Brain/CNS neoplasm, assess treatment response. History of metastatic lung cancer. EXAM: MRI HEAD WITHOUT AND WITH CONTRAST TECHNIQUE: Multiplanar, multiecho pulse sequences of the brain and surrounding structures were obtained without and with intravenous contrast. CONTRAST:  10mL GADAVIST GADOBUTROL 1 MMOL/ML IV SOLN COMPARISON:  Head MRI 08/21/2023 FINDINGS: Brain: A heterogeneously enhancing, partially necrotic lesion in the posterior aspect of the left frontal operculum has an unchanged maximal oblique diameter of 2.2 cm on coronal images (series 16, image 18), however the lesion appears slightly larger on axial and sagittal images (for example, 1.8 cm AP diameter on axial series 15, image 108 compared to 1.5 cm previously). A partially necrotic lesion in the left parietal lobe demonstrates more substantial enlargement and thickness of peripheral enhancement, now measuring up to 4.5 cm (series 15, image 144, previously 3 cm when measured in a similar fashion). This again extends to the dura.  Extensive vasogenic edema in the left parietal and frontal lobes extending into the basal ganglia region has not significantly changed with unchanged 7 mm of rightward midline shift. A 1.0 cm peripherally enhancing lesion in the right occipital lobe is unchanged in size with unchanged minimal surrounding edema (series 15, image 59). Persistent punctate enhancement at the site of a known left cerebellar lesion has increased in intensity but not substantially enlarged, and there is no associated edema (series 15, image 38). No new enhancing brain lesion, acute infarct, or extra-axial fluid collection is identified. The ventricles are unchanged in size without evidence of hydrocephalus. There are chronic blood products associated with the 2 left cerebral lesions, and there is also an unchanged focus of chronic microhemorrhage at the mid to posterior left cingulate gyrus. Vascular: Major intracranial vascular flow voids are preserved. Skull and upper cervical spine: No suspicious marrow lesion. Sinuses/Orbits: Unremarkable orbits. Mild posterior right ethmoid air cell opacification. No significant mastoid fluid. Other: None. IMPRESSION: 1. Slight enlargement of the lesion in the posterior left frontal operculum and more substantial enlargement of the left parietal lesion with unchanged extensive edema and 7 mm of rightward midline shift. 2. No evidence of new intracranial metastases. Electronically Signed   By: Sebastian Ache M.D.   On: 10/09/2023 13:29    Assessment/Plan Malignant neoplasm metastatic to brain [facility])  [name] presents today with focal decline, worsening right sided motor function.  This is secondary to progression of posterior left frontal metastasis visible on MRI, with increased surrounding edema.  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  We recommended the following: -Increase decadron to 4mg  BID starting today -Obtain FDG PET brain for metabolic  characterization -If PET is "hot", plan to re-irradiate -If "cold" steroids/avastin.  Would like to avoid biopsy given the eloquent location. -Review case in detail in CNS tumor board upcoming  He will delay his upcoming tooth extraction to avoid  potential conflict and overlap with avastin.  We appreciate the opportunity to participate in the care of [employer].   We ask that [name] return to clinic in 1-2 weeks for review of workup and treatment planning, or sooner as needed.  All questions were answered. The patient knows to call the clinic with any problems, questions or concerns. No barriers to learning were detected.  The total time spent in the encounter was 40 minutes and more than 50% was on counseling and review of test results   Henreitta Leber, MD Medical Director of Neuro-Oncology [facility] [place] 10/16/23 11:38 AM

## 2023-10-19 ENCOUNTER — Other Ambulatory Visit: Payer: Self-pay | Admitting: Radiation Therapy

## 2023-10-20 ENCOUNTER — Encounter
Admission: RE | Admit: 2023-10-20 | Discharge: 2023-10-20 | Disposition: A | Discharge status: Home / Self Care | Source: Ambulatory Visit | Attending: Internal Medicine | Admitting: Internal Medicine

## 2023-10-20 DIAGNOSIS — C7931 Secondary malignant neoplasm of brain: Secondary | ICD-10-CM | POA: Diagnosis not present

## 2023-10-20 DIAGNOSIS — R930 Abnormal findings on diagnostic imaging of skull and head, not elsewhere classified: Secondary | ICD-10-CM | POA: Diagnosis not present

## 2023-10-20 LAB — GLUCOSE, CAPILLARY: Glucose-Capillary: 89 mg/dL (ref 70–99)

## 2023-10-20 MED ORDER — FLUDEOXYGLUCOSE F - 18 (FDG) INJECTION
10.0000 | Freq: Once | INTRAVENOUS | Status: AC | PRN
  Administered 2023-10-20: 10.18 via INTRAVENOUS

## 2023-10-20 MED ORDER — HEPARIN SOD (PORK) LOCK FLUSH 100 UNIT/ML IV SOLN
INTRAVENOUS | Status: AC
  Filled 2023-10-20: qty 5

## 2023-10-20 MED ORDER — HEPARIN SOD (PORK) LOCK FLUSH 100 UNIT/ML IV SOLN
500.0000 [IU] | Freq: Once | INTRAVENOUS | Status: AC
  Administered 2023-10-20: 500 [IU] via INTRAVENOUS
  Filled 2023-10-20: qty 5

## 2023-10-21 ENCOUNTER — Inpatient Hospital Stay: Payer: HMO

## 2023-10-21 ENCOUNTER — Inpatient Hospital Stay: Payer: HMO | Attending: Oncology

## 2023-10-21 ENCOUNTER — Encounter: Payer: Self-pay | Admitting: Oncology

## 2023-10-21 ENCOUNTER — Inpatient Hospital Stay (HOSPITAL_BASED_OUTPATIENT_CLINIC_OR_DEPARTMENT_OTHER): Payer: HMO | Admitting: Oncology

## 2023-10-21 VITALS — BP 133/82 | HR 67 | Temp 97.3°F | Resp 18 | Wt 218.9 lb

## 2023-10-21 VITALS — BP 146/74 | HR 88 | Temp 96.8°F | Resp 19

## 2023-10-21 DIAGNOSIS — F0390 Unspecified dementia without behavioral disturbance: Secondary | ICD-10-CM | POA: Insufficient documentation

## 2023-10-21 DIAGNOSIS — C7931 Secondary malignant neoplasm of brain: Secondary | ICD-10-CM | POA: Diagnosis not present

## 2023-10-21 DIAGNOSIS — E032 Hypothyroidism due to medicaments and other exogenous substances: Secondary | ICD-10-CM | POA: Diagnosis not present

## 2023-10-21 DIAGNOSIS — Z79899 Other long term (current) drug therapy: Secondary | ICD-10-CM | POA: Diagnosis not present

## 2023-10-21 DIAGNOSIS — C3491 Malignant neoplasm of unspecified part of right bronchus or lung: Secondary | ICD-10-CM

## 2023-10-21 DIAGNOSIS — C3411 Malignant neoplasm of upper lobe, right bronchus or lung: Secondary | ICD-10-CM | POA: Diagnosis not present

## 2023-10-21 DIAGNOSIS — Z5112 Encounter for antineoplastic immunotherapy: Secondary | ICD-10-CM | POA: Diagnosis not present

## 2023-10-21 LAB — CBC WITH DIFFERENTIAL/PLATELET
Abs Immature Granulocytes: 0.09 10*3/uL — ABNORMAL HIGH (ref 0.00–0.07)
Basophils Absolute: 0 10*3/uL (ref 0.0–0.1)
Basophils Relative: 0 %
Eosinophils Absolute: 0 10*3/uL (ref 0.0–0.5)
Eosinophils Relative: 0 %
HCT: 38.4 % — ABNORMAL LOW (ref 39.0–52.0)
Hemoglobin: 12.5 g/dL — ABNORMAL LOW (ref 13.0–17.0)
Immature Granulocytes: 1 %
Lymphocytes Relative: 5 %
Lymphs Abs: 0.5 10*3/uL — ABNORMAL LOW (ref 0.7–4.0)
MCH: 28.2 pg (ref 26.0–34.0)
MCHC: 32.6 g/dL (ref 30.0–36.0)
MCV: 86.5 fL (ref 80.0–100.0)
Monocytes Absolute: 0.7 10*3/uL (ref 0.1–1.0)
Monocytes Relative: 7 %
Neutro Abs: 9.1 10*3/uL — ABNORMAL HIGH (ref 1.7–7.7)
Neutrophils Relative %: 87 %
Platelets: 200 10*3/uL (ref 150–400)
RBC: 4.44 MIL/uL (ref 4.22–5.81)
RDW: 14.4 % (ref 11.5–15.5)
WBC: 10.4 10*3/uL (ref 4.0–10.5)
nRBC: 0 % (ref 0.0–0.2)

## 2023-10-21 LAB — COMPREHENSIVE METABOLIC PANEL WITH GFR
ALT: 41 U/L (ref 0–44)
AST: 30 U/L (ref 15–41)
Albumin: 3.6 g/dL (ref 3.5–5.0)
Alkaline Phosphatase: 60 U/L (ref 38–126)
Anion gap: 9 (ref 5–15)
BUN: 29 mg/dL — ABNORMAL HIGH (ref 8–23)
CO2: 25 mmol/L (ref 22–32)
Calcium: 8.9 mg/dL (ref 8.9–10.3)
Chloride: 103 mmol/L (ref 98–111)
Creatinine, Ser: 0.94 mg/dL (ref 0.61–1.24)
GFR, Estimated: >60 mL/min (ref >60)
Glucose, Bld: 144 mg/dL — ABNORMAL HIGH (ref 70–99)
Potassium: 3.5 mmol/L (ref 3.5–5.1)
Sodium: 137 mmol/L (ref 135–145)
Total Bilirubin: 0.7 mg/dL (ref 0.0–1.2)
Total Protein: 6.7 g/dL (ref 6.5–8.1)

## 2023-10-21 MED ORDER — SODIUM CHLORIDE 0.9 % IV SOLN
Freq: Once | INTRAVENOUS | Status: AC
Start: 2023-10-21 — End: 2023-10-21
  Filled 2023-10-21: qty 250

## 2023-10-21 MED ORDER — SODIUM CHLORIDE 0.9 % IV SOLN
10.0000 mg/kg | Freq: Once | INTRAVENOUS | Status: AC
  Administered 2023-10-21: 1000 mg via INTRAVENOUS
  Filled 2023-10-21: qty 20

## 2023-10-21 MED ORDER — HEPARIN SOD (PORK) LOCK FLUSH 100 UNIT/ML IV SOLN
500.0000 [IU] | Freq: Once | INTRAVENOUS | Status: AC | PRN
  Administered 2023-10-21: 500 [IU]
  Filled 2023-10-21: qty 5

## 2023-10-21 NOTE — Assessment & Plan Note (Signed)
 continue levothyroxine 125 mcg daily

## 2023-10-21 NOTE — Assessment & Plan Note (Addendum)
 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 Mitzi Hansen.  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  He is currently on Dexamethasone 4mg  BID. Case will be discussed on CNS tumor board. -Follow up with Dr. Barbaraann Cao.  Recommend patient to avoid driving until cleared by neurology.

## 2023-10-21 NOTE — Assessment & Plan Note (Signed)
Durvalumab treatment as planned.  

## 2023-10-21 NOTE — Progress Notes (Signed)
 Hematology/Oncology Progress note [phone] [phone]    CHIEF COMPLAINTS/REASON FOR VISIT:  Follow up for lung cancer   ASSESSMENT & PLAN:   Cancer Staging  Squamous cell carcinoma of right lung (HCC) Staging form: Lung, AJCC 8th Edition - Clinical stage from 09/20/2020: Stage IV (cT1b, cN2, cM1) - Signed by [name], MD on 12/13/2021   Squamous cell carcinoma of right lung (HCC) 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  Labs are reviewed and discussed with patient. Proceed with Durvalumab.   Encounter for antineoplastic immunotherapy Durvalumab treatment as planned.   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 Mitzi Hansen.  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  He is currently on Dexamethasone 4mg  BID. Case will be discussed on CNS tumor board. -Follow up with Dr. Barbaraann Cao.  Recommend patient to avoid driving until cleared by neurology.    Follow up 2 weeks lab MD Durvalumab All questions were answered. The patient knows to call the clinic with any problems, questions or concerns.  [name], MD, PhD [facility] Hematology Oncology 10/21/2023       HISTORY OF PRESENTING ILLNESS:  Joshua [name] is a 68 y.o. male who has above history reviewed by me today presents for follow up visit for Stage IV lung squamous cell carcinoma. Oncology History  Squamous cell carcinoma of right lung (HCC)  09/18/2020 Imaging   PET scan showed right upper lobe nodule maximal SUV 11.5.  Hypermetabolic cluster right paratracheal adenopathy with maximum SUV up to 12.  No findings of metastatic disease to the neck/abdomen/pelvis or skeleton.   09/20/2020 Initial Diagnosis   Squamous cell carcinoma of right lung (HCC) NGS showed PD-L1 TPS  95%, LRP1B S939fs, PIK3CA E545K, RB1 c1422-2A>T, RIT1 T83_ A84del, STK11 P234fs, TPS53 R248W, TMB 19.73mut/mb, MS stable.    09/20/2020 Cancer Staging   Staging form: Lung, AJCC 8th Edition - Clinical stage from 09/20/2020: Stage IV (cT1b, cN2, cM1) - Signed by [name], MD on 12/13/2021   10/03/2020 Imaging   MRI brain with and without contrast showed no evidence of intracranial metastatic disease. Well-defined round lesion within the left parotid tail measuring approximately 2.3 x 1.8 x 1.8 cm   10/09/2020 - 11/27/2020 Radiation Therapy   concurrent chemoradiation   10/10/2020 - 10/31/2020 Chemotherapy   10/10/2020 concurrent chemoradiation.  carboplatin AUC of 2 and Taxol 45 mg/m2 x 4.  Additional chemotherapy was held due to thrombocytopenia, neutropenia       12/31/2020 - 03/21/2022 Chemotherapy   Maintenance  Durvalumab q14d      12/31/2020 -  Chemotherapy   Patient is on Treatment Plan : LUNG Durvalumab (10) q14d     03/28/2021 Imaging   CT without contrast showed new linear right perihilar consolidation with associated traction bronchiectasis.  Likely postradiation changes.  Additional new peribronchial vascular/nodular groundglass opacities with areas of consolidation or seen primarily in the right upper and lower lobes, more distant from expected radiation fields. 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  Per my discussion with pulmonology Dr. 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     05/09/2021 Imaging   Repeat CT chest without contrast showed evolving radiation changes in the right perihilar region likely accounting  for interval increase to thickening of the posterior wall of the right upper lobe bronchus.  The more peripheral right lung groundglass opacities seen on most recent study have generally improved with exception of one peripherally right upper  lobe which appears new.  These are likely inflammatory.  No findings highly suspicious for local recurrence.   05/30/2021 - 01/10/2022 Chemotherapy   Durvalumab q14d     10/07/2021 Imaging   MRI brain with and without contrast showed 2 contrast-enhancing lesions, most consistent with metastatic disease.  Largest lesion is in the left frontal lobe and measures up to 1.3 cm with a large amount of surrounding edema and 9 mm rightward midline shift.  The other enhancing lesion was 4 mm in the right occipital lobe with a small amount of surrounding edema.  No acute or chronic hemorrhage.   10/07/2021 Progression   Headache after colonoscopy, progressive difficulty using her right arm.  Progressively worsening of speech. Stage IV lung cancer with brain metastasis.  started on Dexamethasone.  He was seen by neurosurgery and radiation oncology.  Recommendation is to proceed with SRS.  Patient was discharged with dexamethasone   10/16/2021 Imaging   PET scan showed postradiation changes in the right supra hilar lung without evidence of local recurrence.  No evidence of lung cancer recurrence or metastatic disease.  Single focus of intense metabolic activity associated with posterior left 11th rib likely secondary to recent fall/posttraumatic metabolic changes.  Frontal  uptake in the left frontal lobe corresponding to the known metastatic lesion in the brain   10/29/2021 -  Radiation Therapy   Brain Radiation [facility]   12/10/2021 Imaging   Brain MRI: Continued interval decrease in size of a left frontal lobe metastasis, now measuring 10 mm. Moderate surrounding edema has also decreased.  3-4 mm metastasis within the right occipital lobe, not significantly changed in size. There is now a small focus of central non enhancement within this lesion. Increased edema. 3 mm enhancing metastasis within the high left parietal lobe. In retrospect, this was likely present as a subtle punctate focus of enhancement on the prior  brain MRI of 10/22/2021.   01/18/2022 Imaging   CT chest abdomen pelvis w contrast 1. Stable post treatment changes in the right upper lobe medially with dense radiation fibrosis but no findings to suggest local recurrent tumor. 2. Significant progression of ill-defined ground-glass opacity, interstitial thickening and airspace nodularity in the right upper lobe and upper aspect of the right lower lobe. Findings could reflect an inflammatory or atypical infectious process, drug induced pneumonitis or interstitial spread of tumor.3. No mediastinal or hilar mass or adenopathy. 4. No findings for abdominal/pelvic metastatic disease or osseous metastatic disease.   02/19/2022 Imaging   MRI brain w wo contrast showed Three metastatic deposits in the brain appear smaller. Decreased edema in the left parietal lobe. No new lesions    04/04/2022 Imaging   CT chest w contrast 1. Stable radiation changes in the right perihilar region without evidence of local recurrence. 2. Interval improvement in the patchy ground-glass opacities previously demonstrated in both lungs, most consistent with a resolving infectious or inflammatory process (including immunotherapy-related pneumonitis). 3. No evidence of metastatic disease.   05/14/2022 Miscellaneous   He had a mechanical fall and fractured his left elbow. xrays showed a left elbow fracture  05/26/22 seen by orthopedic surgeon, cast applied.    05/27/2022 Imaging   MRI brain w wo contrast 1. All three known brain metastases have enlarged since August,  with increasing regional edema. And there is a small new left anterior cerebellar 4-5 mm metastasis (was questionably punctate on the May MRI). This constellation could reflect a combination of True Progression (Left Cerebellar lesion) and pseudoprogression/radionecrosis (3 existing mets).    2. No significant intracranial mass effect. 3. Underlying moderate cerebral white matter signal changes.   05/30/2022  Imaging   CT chest abdomen pelvis w contrast  1. 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 There is a stable small right pleural effusion, but no definitive findings to suggest residual or recurrent disease on today's examination. 2. No signs of metastatic disease in the abdomen or pelvis.  3. Aortic atherosclerosis, in addition to two-vessel coronary artery disease. Please note that although the presence of coronary artery calcium documents the presence of coronary artery disease, the severity of this disease and any potential stenosis cannot be assessed on this non-gated CT examination. Assessment for potential risk factor modification, dietary therapy or pharmacologic therapy may be warranted, if clinically indicated. 4. Hepatic steatosis. 5. Colonic diverticulosis without evidence of acute diverticulitis at this time. 6. Incompletely imaged but apparently occluded vascular graft in the superior aspect of the right anterior thigh. Referral to vascular specialist for further clinical evaluation should be considered if clinically appropriate. 7. Additional incidental findings, as above.   07/22/2022 Imaging   MRI brain  1. No new or enlarging lesions. No intracranial disease progression. 2. Decreased size of treated lesions of the left frontoparietal junction and left postcentral gyrus. 3. Unchanged lesions in the left cerebellum and right occipital lobe.   10/15/2022 Imaging   MRI brain w wo contrast showed Interval increase in size of multiple intraparenchymal lesions with worsened surrounding vasogenic edema. 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 No new lesions   11/05/2022 Imaging   MRI brain w wo contrast  1. Likely slight increase and right occipital and left cerebellar lesions. Otherwise,  unchanged metastases, as above. No new lesions identified. 2. Partially imaged large probable lymph node in the left upper neck, apparent on prior PET CT.   11/13/2022 -  Radiation Therapy   Each of the sites were treated with SRS/SBRT/SRT-IMRT technique to 20 Gy in 1 fraction to each of the following sites: PTV_2_LCerebel_25mm PTV_3_RCerebel_89mm PTV_4_ROccip_84mm PTV_5_LParietal_36mm   11/27/2022 Imaging   CT chest abdomen pelvis w contrast showed  Improving bilateral scattered interstitial and ground-glass opacities. Small amount of residual areas in the extreme left lower lobe inferiorly. This specific area is slightly increased from previous but overall the amount of opacities is significantly improved. Recommend continued follow-up. No developing new mass lesion, fluid collection or lymph node enlargement. Stable posttreatment changes right perihilar in the lung. Fatty liver infiltration. Colonic diverticulosis. Improved tiny right pleural effusion   02/05/2023 Imaging   MRI brain with and without contrast Interval decrease in all of the metastatic lesions, detailed above.No new lesions identified.    Imaging   CT chest abdomen pelvis w contrast showed  1. 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 2. Trace right pleural fluid or thickening, new or minimally increased. 3. Incidental findings, including: Aortic atherosclerosis (ICD10-I70.0), coronary artery atherosclerosis and emphysema (ICD10-J43.9). Hepatic steatosis.   06/25/2023 Imaging   CT chest abdomen pelvis w contrast showed 1. Unchanged post treatment/post radiation appearance of the right lung, with perihilar and suprahilar paramedian fibrosis  and consolidation. 2. No evidence of lymphadenopathy or metastatic disease in the chest, abdomen, or pelvis. 3. Hepatic steatosis. 4. Coronary artery disease.   Aortic Atherosclerosis (ICD10-I70.0) and Emphysema  (ICD10-J43.9).   08/26/2023 [facility] Admission   hospitalization due to was observed speech and jerking movements on right side despite being on dexamethasone. Continuous EEG suggest cortical dysfunction arising from left temporal region.  No seizure or elective form discharges were seen. Patient was discharged on dexamethasone 4 mg, Keppra, lamotrigine.      chronic SOB,not worse.  Patient denies any additional seizure episodes.  He feels some dizziness/imbalance while walking. No additional episodes.    Review of Systems  Constitutional:  Positive for fatigue. Negative for appetite change, chills, fever and unexpected weight change.  HENT:   Negative for hearing loss and voice change.   Eyes:  Negative for eye problems and icterus.  Respiratory:  Negative for chest tightness, cough and shortness of breath.   Cardiovascular:  Negative for chest pain and leg swelling.  Gastrointestinal:  Negative for abdominal distention and abdominal pain.  Endocrine: Negative for hot flashes.  Genitourinary:  Negative for difficulty urinating, dysuria and frequency.   Musculoskeletal:  Positive for gait problem. Negative for arthralgias.  Skin:  Negative for itching and rash.  Neurological:  Positive for dizziness and gait problem. Negative for extremity weakness, headaches, numbness and speech difficulty.  Hematological:  Negative for adenopathy. Does not bruise/bleed easily.  Psychiatric/Behavioral:  Negative for confusion.       MEDICAL HISTORY:  Past Medical History:  Diagnosis Date   Allergy    Cancer (HCC)    lung   COPD (chronic obstructive pulmonary disease) (HCC)    Dyspnea    former smoker. 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   Malignant neoplasm metastatic to brain [facility])    Peripheral vascular disease (HCC)    Pre-diabetes    Sleep apnea     SURGICAL HISTORY: Past Surgical History:  Procedure Laterality  Date   ANTERIOR CERVICAL DECOMP/DISCECTOMY FUSION N/A 03/14/2020   Procedure: ANTERIOR CERVICAL DECOMPRESSION/DISCECTOMY FUSION 3 LEVELS C4-7;  Surgeon: Venetia Night, MD;  Location: [facility] ORS;  Service: Neurosurgery;  Laterality: N/A;   BACK SURGERY  2011   CENTRAL LINE INSERTION Right 09/10/2016   Procedure: CENTRAL LINE INSERTION;  Surgeon: Renford Dills, MD;  Location: [facility] ORS;  Service: Vascular;  Laterality: Right;   CERVICAL FUSION     C4-c7 on Mar 14, 2020   COLONOSCOPY  2013 ?   COLONOSCOPY N/A 09/09/2021   Procedure: COLONOSCOPY;  Surgeon: Jaynie Collins, DO;  Location: [facility] ENDOSCOPY;  Service: Gastroenterology;  Laterality: N/A;   CORONARY ANGIOPLASTY     ESOPHAGOGASTRODUODENOSCOPY N/A 09/09/2021   Procedure: ESOPHAGOGASTRODUODENOSCOPY (EGD);  Surgeon: Jaynie Collins, DO;  Location: [facility] ENDOSCOPY;  Service: Gastroenterology;  Laterality: N/A;   FEMORAL-POPLITEAL BYPASS GRAFT Right 09/10/2016   Procedure: BYPASS GRAFT FEMORAL-POPLITEAL ARTERY ( BELOW KNEE );  Surgeon: Renford Dills, MD;  Location: [facility] ORS;  Service: Vascular;  Laterality: Right;   FEMORAL-TIBIAL BYPASS GRAFT Right 03/23/2020   Procedure: BYPASS GRAFT RIGHT FEMORAL- DISTAL TIBIAL ARTERY WITH DISTAL FLOW GRAFT;  Surgeon: Renford Dills, MD;  Location: [facility] ORS;  Service: Vascular;  Laterality: Right;   LEFT HEART CATH AND CORONARY ANGIOGRAPHY Left 05/16/2021   Procedure: LEFT HEART CATH AND CORONARY ANGIOGRAPHY;  Surgeon: Armando Reichert, MD;  Location: [facility]  INVASIVE CV LAB;  Service: Cardiovascular;  Laterality: Left;   LOWER EXTREMITY ANGIOGRAPHY Right 11/18/2016   Procedure: Lower Extremity Angiography;  Surgeon: Renford Dills, MD;  Location: [facility] INVASIVE CV LAB;  Service: Cardiovascular;  Laterality: Right;   LOWER EXTREMITY ANGIOGRAPHY Right 12/09/2016   Procedure: Lower Extremity Angiography;  Surgeon: Renford Dills, MD;  Location: [facility] INVASIVE CV LAB;  Service:  Cardiovascular;  Laterality: Right;   LOWER EXTREMITY ANGIOGRAPHY Right 11/15/2019   Procedure: LOWER EXTREMITY ANGIOGRAPHY;  Surgeon: Renford Dills, MD;  Location: [facility] INVASIVE CV LAB;  Service: Cardiovascular;  Laterality: Right;   LOWER EXTREMITY ANGIOGRAPHY Right 12/01/2019   Procedure: Lower Extremity Angiography;  Surgeon: Annice Needy, MD;  Location: [facility] INVASIVE CV LAB;  Service: Cardiovascular;  Laterality: Right;   LOWER EXTREMITY ANGIOGRAPHY Right 12/02/2019   Procedure: LOWER EXTREMITY ANGIOGRAPHY;  Surgeon: Renford Dills, MD;  Location: [facility] INVASIVE CV LAB;  Service: Cardiovascular;  Laterality: Right;   LOWER EXTREMITY ANGIOGRAPHY Right 03/23/2020   Procedure: Lower Extremity Angiography;  Surgeon: Renford Dills, MD;  Location: [facility] INVASIVE CV LAB;  Service: Cardiovascular;  Laterality: Right;   LOWER EXTREMITY ANGIOGRAPHY Right 09/25/2021   Procedure: Lower Extremity Angiography;  Surgeon: Renford Dills, MD;  Location: [facility] INVASIVE CV LAB;  Service: Cardiovascular;  Laterality: Right;   LOWER EXTREMITY ANGIOGRAPHY Right 09/26/2021   Procedure: Lower Extremity Angiography;  Surgeon: Annice Needy, MD;  Location: [facility] INVASIVE CV LAB;  Service: Cardiovascular;  Laterality: Right;   LOWER EXTREMITY ANGIOGRAPHY Right 04/08/2022   Procedure: Lower Extremity Angiography;  Surgeon: Renford Dills, MD;  Location: [facility] INVASIVE CV LAB;  Service: Cardiovascular;  Laterality: Right;   LOWER EXTREMITY INTERVENTION  11/18/2016   Procedure: Lower Extremity Intervention;  Surgeon: Renford Dills, MD;  Location: [facility] INVASIVE CV LAB;  Service: Cardiovascular;;   PERIPHERAL VASCULAR CATHETERIZATION Right 01/02/2015   Procedure: Lower Extremity Angiography;  Surgeon: Renford Dills, MD;  Location: [facility] INVASIVE CV LAB;  Service: Cardiovascular;  Laterality: Right;   PERIPHERAL VASCULAR CATHETERIZATION Right 06/18/2015   Procedure: Lower Extremity Angiography;  Surgeon: Annice Needy, MD;  Location: [facility] INVASIVE CV LAB;  Service: Cardiovascular;  Laterality: Right;   PERIPHERAL VASCULAR CATHETERIZATION  06/18/2015   Procedure: Lower Extremity Intervention;  Surgeon: Annice Needy, MD;  Location: [facility] INVASIVE CV LAB;  Service: Cardiovascular;;   PERIPHERAL VASCULAR CATHETERIZATION N/A 10/09/2015   Procedure: Abdominal Aortogram w/Lower Extremity;  Surgeon: Renford Dills, MD;  Location: [facility] INVASIVE CV LAB;  Service: Cardiovascular;  Laterality: N/A;   PERIPHERAL VASCULAR CATHETERIZATION  10/09/2015   Procedure: Lower Extremity Intervention;  Surgeon: Renford Dills, MD;  Location: [facility] INVASIVE CV LAB;  Service: Cardiovascular;;   PERIPHERAL VASCULAR CATHETERIZATION Right 10/10/2015   Procedure: Lower Extremity Angiography;  Surgeon: Annice Needy, MD;  Location: [facility] INVASIVE CV LAB;  Service: Cardiovascular;  Laterality: Right;   PERIPHERAL VASCULAR CATHETERIZATION Right 10/10/2015   Procedure: Lower Extremity Intervention;  Surgeon: Annice Needy, MD;  Location: [facility] INVASIVE CV LAB;  Service: Cardiovascular;  Laterality: Right;   PERIPHERAL VASCULAR CATHETERIZATION Right 12/03/2015   Procedure: Lower Extremity Angiography;  Surgeon: Annice Needy, MD;  Location: [facility] INVASIVE CV LAB;  Service: Cardiovascular;  Laterality: Right;   PERIPHERAL VASCULAR CATHETERIZATION Right 12/04/2015   Procedure: Lower Extremity Angiography;  Surgeon: Annice Needy, MD;  Location: [facility] INVASIVE CV LAB;  Service: Cardiovascular;  Laterality: Right;   PERIPHERAL VASCULAR CATHETERIZATION  12/04/2015  Procedure: Lower Extremity Intervention;  Surgeon: Annice Needy, MD;  Location: [facility] INVASIVE CV LAB;  Service: Cardiovascular;;   PERIPHERAL VASCULAR CATHETERIZATION Right 06/30/2016   Procedure: Lower Extremity Angiography;  Surgeon: Annice Needy, MD;  Location: [facility] INVASIVE CV LAB;  Service: Cardiovascular;  Laterality: Right;   PERIPHERAL VASCULAR CATHETERIZATION Right 06/25/2016   Procedure:  Lower Extremity Angiography;  Surgeon: Renford Dills, MD;  Location: [facility] INVASIVE CV LAB;  Service: Cardiovascular;  Laterality: Right;   PERIPHERAL VASCULAR CATHETERIZATION Right 07/01/2016   Procedure: Lower Extremity Angiography;  Surgeon: Renford Dills, MD;  Location: [facility] INVASIVE CV LAB;  Service: Cardiovascular;  Laterality: Right;   PERIPHERAL VASCULAR CATHETERIZATION  07/01/2016   Procedure: Lower Extremity Intervention;  Surgeon: Renford Dills, MD;  Location: [facility] INVASIVE CV LAB;  Service: Cardiovascular;;   PERIPHERAL VASCULAR CATHETERIZATION Right 08/01/2016   Procedure: Lower Extremity Angiography;  Surgeon: Renford Dills, MD;  Location: [facility] INVASIVE CV LAB;  Service: Cardiovascular;  Laterality: Right;   PORTA CATH INSERTION N/A 10/05/2020   Procedure: PORTA CATH INSERTION;  Surgeon: Renford Dills, MD;  Location: [facility] INVASIVE CV LAB;  Service: Cardiovascular;  Laterality: N/A;   stent placement in right leg Right    VIDEO BRONCHOSCOPY N/A 09/14/2020   Procedure: VIDEO BRONCHOSCOPY WITH FLUORO;  Surgeon: Salena Saner, MD;  Location: [facility] ORS;  Service: Cardiopulmonary;  Laterality: N/A;   VIDEO BRONCHOSCOPY WITH ENDOBRONCHIAL ULTRASOUND N/A 09/14/2020   Procedure: VIDEO BRONCHOSCOPY WITH ENDOBRONCHIAL ULTRASOUND;  Surgeon: Salena Saner, MD;  Location: [facility] ORS;  Service: Cardiopulmonary;  Laterality: N/A;    SOCIAL HISTORY: Social History   Socioeconomic History   Marital status: Single    Spouse name: Not on file   Number of children: Not on file   Years of education: Not on file   Highest education level: Not on file  Occupational History   Occupation: unemployed  Tobacco Use   Smoking status: Former    Current packs/day: 0.00    Average packs/day: 2.0 packs/day for 38.0 years (76.0 ttl pk-yrs)    Types: Cigarettes    Start date: 12/19/1976    Quit date: 12/20/2014    Years since quitting: 8.8   Smokeless tobacco: Never   Tobacco  comments:    quit smoking in 2017 most smoked 2   Vaping Use   Vaping status: Former  Substance and Sexual Activity   Alcohol use: Not Currently   Drug use: No   Sexual activity: Yes  Other Topics Concern   Not on file  Social History Narrative   Not on file   Social Drivers of Health   Financial Resource Strain: Low Risk  (07/27/2023)   Received [facility]   Overall Financial Resource Strain (CARDIA)    Difficulty of Paying Living Expenses: Not hard at all  Food Insecurity: No Food Insecurity (08/21/2023)   Hunger Vital Sign    Worried About Running Out of Food in the Last Year: Never true    Ran Out of Food in the Last Year: Never true  Transportation Needs: No Transportation Needs (08/21/2023)   PRAPARE - Administrator, Civil Service (Medical): No    Lack of Transportation (Non-Medical): No  Physical Activity: Insufficiently Active (05/29/2023)   Exercise Vital Sign    Days of Exercise per Week: 2 days    Minutes of Exercise per Session: 10 min  Stress: No Stress Concern Present (05/29/2023)   [employer]  of Occupational Health - Occupational Stress Questionnaire    Feeling of Stress : Not at all  Social Connections: Socially Isolated (08/21/2023)   Social Connection and Isolation Panel [NHANES]    Frequency of Communication with Friends and Family: More than three times a week    Frequency of Social Gatherings with Friends and Family: Three times a week    Attends Religious Services: Never    Active Member of Clubs or Organizations: No    Attends Banker Meetings: Never    Marital Status: Never married  Intimate Partner Violence: Not At Risk (08/21/2023)   Humiliation, Afraid, Rape, and Kick questionnaire    Fear of Current or Ex-Partner: No    Emotionally Abused: No    Physically Abused: No    Sexually Abused: No    FAMILY HISTORY: Family History  Problem Relation Age of Onset   Diabetes Mother    Hypertension  Mother    Heart murmur Mother    Leukemia Mother    Throat cancer Maternal Grandmother    Colon cancer Neg Hx    Breast cancer Neg Hx     ALLERGIES:  has no active allergies.  MEDICATIONS:  Current Outpatient Medications  Medication Sig Dispense Refill   aspirin EC 81 MG tablet Take 81 mg by mouth daily. Swallow whole.     clopidogrel (PLAVIX) 75 MG tablet TAKE 1 TABLET BY MOUTH DAILY 100 tablet 2   dexamethasone (DECADRON) 4 MG tablet Take 1 tablet (4 mg total) by mouth 2 (two) times daily. 60 tablet 0   levETIRAcetam (KEPPRA) 1000 MG tablet Take 1 tablet (1,000 mg total) by mouth 2 (two) times daily. 60 tablet 2   levothyroxine (LEVOXYL) 125 MCG tablet Take 1 tablet (125 mcg total) by mouth daily before breakfast. 90 tablet 0   rosuvastatin (CRESTOR) 40 MG tablet Take 1 tablet (40 mg total) by mouth daily. 90 tablet 3   No current facility-administered medications for this visit.   Facility-Administered Medications Ordered in Other Visits  Medication Dose Route Frequency Provider Last Rate Last Admin   albuterol (PROVENTIL) (2.5 MG/3ML) 0.083% nebulizer solution 2.5 mg  2.5 mg Nebulization Once Salena Saner, MD       heparin lock flush 100 unit/mL  500 Units Intracatheter Once PRN [name], MD         PHYSICAL EXAMINATION: ECOG PERFORMANCE STATUS: 1 - Symptomatic but completely ambulatory Vitals:   10/21/23 1318  BP: 133/82  Pulse: 67  Resp: 18  Temp: (!) 97.3 F (36.3 C)  SpO2: 99%    Filed Weights   10/21/23 1318  Weight: 218 lb 14.4 oz (99.3 kg)     Physical Exam Constitutional:      General: He is not in acute distress.    Appearance: He is obese.  HENT:     Head: Normocephalic and atraumatic.  Eyes:     General: No scleral icterus. Cardiovascular:     Rate and Rhythm: Normal rate and regular rhythm.  Pulmonary:     Effort: Pulmonary effort is normal. No respiratory distress.     Breath sounds: Normal breath sounds. No wheezing.  Abdominal:      General: There is no distension.     Palpations: Abdomen is soft.     Tenderness: There is no abdominal tenderness.  Musculoskeletal:     Cervical back: Normal range of motion and neck supple.  Skin:    General: Skin is warm and dry.  Findings: No erythema.  Neurological:     Mental Status: He is alert and oriented to person, place, and time. Mental status is at baseline.     Cranial Nerves: No cranial nerve deficit.     Motor: No abnormal muscle tone.  Psychiatric:        Mood and Affect: Mood and affect normal.      LABORATORY DATA:  I have reviewed the data as listed    Latest Ref Rng & Units 10/21/2023    1:05 PM 10/07/2023    1:18 PM 09/23/2023    8:35 AM  CBC  WBC 4.0 - 10.5 K/uL 10.4  7.0  6.0   Hemoglobin 13.0 - 17.0 g/dL 78.2  95.6  21.3   Hematocrit 39.0 - 52.0 % 38.4  39.9  40.2   Platelets 150 - 400 K/uL 200  221  145       Latest Ref Rng & Units 10/21/2023    1:05 PM 10/07/2023    1:18 PM 09/23/2023    8:35 AM  CMP  Glucose 70 - 99 mg/dL 086  578  469   BUN 8 - 23 mg/dL 29  24  20    Creatinine 0.61 - 1.24 mg/dL 6.29  5.28  4.13   Sodium 135 - 145 mmol/L 137  138  136   Potassium 3.5 - 5.1 mmol/L 3.5  3.5  3.5   Chloride 98 - 111 mmol/L 103  105  101   CO2 22 - 32 mmol/L 25  25  28    Calcium 8.9 - 10.3 mg/dL 8.9  8.6  9.0   Total Protein 6.5 - 8.1 g/dL 6.7  6.7  6.7   Total Bilirubin 0.0 - 1.2 mg/dL 0.7  0.7  0.6   Alkaline Phos 38 - 126 U/L 60  62  75   AST 15 - 41 U/L 30  18  27    ALT 0 - 44 U/L 41  20  33      RADIOGRAPHIC STUDIES: I have personally reviewed the radiological images as listed and agreed with the findings in the report. NM PET Metabolic Brain Result Date: 10/20/2023 CLINICAL DATA:  Dementia. 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 Full-ring PET imaging was performed from the vertex to skull base. CT data was obtained and used for  attenuation correction and anatomic localization. Post processing fusion of PET data set with brain MRI 10/09/2023. FASTING BLOOD GLUCOSE:  Value: 88 mg/dl COMPARISON:  Brain MRI 10/09/2018 FINDINGS: Focus of hypermetabolic activity in the posterior lateral LEFT frontal lobe which is slightly greater than cortical metabolism (image 58). This corresponds to enhancing lesion on comparison MRI. On coronal orientation the most intense portion the lesion along the craniad surface of the lesion. Thin rim of peripheral hypermetabolic activity associated with the high LEFT parietal enhancing lesion (image 24 through 31 PET data set). This rim of metabolic activity is slightly greater than adjacent normal cortical metabolic activity. IMPRESSION: Findings concerning for tumor recurrence within the LEFT posterior lateral frontal lobe and high LEFT parietal lobe. Electronically Signed   By: Genevive Bi M.D.   On: 10/20/2023 12:30   CT CHEST ABDOMEN PELVIS W CONTRAST Result Date: 10/18/2023 CLINICAL DATA:  Lung cancer restaging * Tracking Code: BO * EXAM: CT CHEST, ABDOMEN, AND PELVIS WITH CONTRAST TECHNIQUE: Multidetector CT imaging of the chest, abdomen and pelvis was performed following the standard protocol during  bolus administration of intravenous contrast. RADIATION DOSE REDUCTION: This exam was performed according to the departmental dose-optimization program which includes automated exposure control, adjustment of the mA and/or kV according to patient size and/or use of iterative reconstruction technique. CONTRAST:  OMNIPAQUE IOHEXOL 300 MG/ML  SOLN COMPARISON:  06/25/2023 FINDINGS: CT CHEST FINDINGS Cardiovascular: Right chest port catheter. Scattered aortic atherosclerosis. Normal heart size. Scattered left and right coronary artery calcifications. No pericardial effusion. Mediastinum/Nodes: No enlarged mediastinal, hilar, or axillary lymph nodes. Thyroid gland, trachea, and esophagus demonstrate no  significant findings. Lungs/Pleura: Unchanged post treatment/post radiation appearance of the right lung with perihilar and paramedian fibrosis and consolidation. Minimal paraseptal emphysema. No pleural effusion or pneumothorax. Musculoskeletal: No chest wall abnormality. No acute osseous findings. CT ABDOMEN PELVIS FINDINGS Hepatobiliary: No solid liver abnormality is seen. Hepatic steatosis. No gallstones, gallbladder wall thickening, or biliary dilatation. Pancreas: Unremarkable. No pancreatic ductal dilatation or surrounding inflammatory changes. Spleen: Normal in size without significant abnormality. Adrenals/Urinary Tract: Adrenal glands are unremarkable. Kidneys are normal, without renal calculi, solid lesion, or hydronephrosis. Bladder is unremarkable. Stomach/Bowel: Stomach is within normal limits. Appendix appears normal. No evidence of bowel wall thickening, distention, or inflammatory changes. Sigmoid diverticulosis. Vascular/Lymphatic: Aortic atherosclerosis. Partially imaged right superficial femoral and profunda system stenting. No enlarged abdominal or pelvic lymph nodes. Reproductive: No mass or other abnormality. Other: No abdominal wall hernia or abnormality. No ascites. Musculoskeletal: No acute osseous findings. IMPRESSION: 1. Unchanged post treatment/post radiation appearance of the right lung with perihilar and paramedian fibrosis and consolidation. 2. No evidence of lymphadenopathy or metastatic disease in the chest, abdomen, or pelvis. 3. Hepatic steatosis. 4. Coronary artery disease. Aortic Atherosclerosis (ICD10-I70.0) and Emphysema (ICD10-J43.9). Electronically Signed   By: Jearld Lesch M.D.   On: 10/18/2023 08:12   MR BRAIN W WO CONTRAST Result Date: 10/09/2023 CLINICAL DATA:  Brain/CNS neoplasm, assess treatment response. History of metastatic lung cancer. EXAM: MRI HEAD WITHOUT AND WITH CONTRAST TECHNIQUE: Multiplanar, multiecho pulse sequences of the brain and surrounding  structures were obtained without and with intravenous contrast. CONTRAST:  10mL GADAVIST GADOBUTROL 1 MMOL/ML IV SOLN COMPARISON:  Head MRI 08/21/2023 FINDINGS: Brain: A heterogeneously enhancing, partially necrotic lesion in the posterior aspect of the left frontal operculum has an unchanged maximal oblique diameter of 2.2 cm on coronal images (series 16, image 18), however the lesion appears slightly larger on axial and sagittal images (for example, 1.8 cm AP diameter on axial series 15, image 108 compared to 1.5 cm previously). A partially necrotic lesion in the left parietal lobe demonstrates more substantial enlargement and thickness of peripheral enhancement, now measuring up to 4.5 cm (series 15, image 144, previously 3 cm when measured in a similar fashion). This again extends to the dura. Extensive vasogenic edema in the left parietal and frontal lobes extending into the basal ganglia region has not significantly changed with unchanged 7 mm of rightward midline shift. A 1.0 cm peripherally enhancing lesion in the right occipital lobe is unchanged in size with unchanged minimal surrounding edema (series 15, image 59). Persistent punctate enhancement at the site of a known left cerebellar lesion has increased in intensity but not substantially enlarged, and there is no associated edema (series 15, image 38). No new enhancing brain lesion, acute infarct, or extra-axial fluid collection is identified. The ventricles are unchanged in size without evidence of hydrocephalus. There are chronic blood products associated with the 2 left cerebral lesions, and there is also an unchanged focus of chronic microhemorrhage at  the mid to posterior left cingulate gyrus. Vascular: Major intracranial vascular flow voids are preserved. Skull and upper cervical spine: No suspicious marrow lesion. Sinuses/Orbits: Unremarkable orbits. Mild posterior right ethmoid air cell opacification. No significant mastoid fluid. Other: None.  IMPRESSION: 1. Slight enlargement of the lesion in the posterior left frontal operculum and more substantial enlargement of the left parietal lesion with unchanged extensive edema and 7 mm of rightward midline shift. 2. No evidence of new intracranial metastases. Electronically Signed   By: Sebastian Ache M.D.   On: 10/09/2023 13:29   MR BRAIN W WO CONTRAST Result Date: 08/21/2023 CLINICAL DATA:  Metastatic disease evaluation. History of metastatic lung cancer. EXAM: MRI HEAD WITHOUT AND WITH CONTRAST TECHNIQUE: Multiplanar, multiecho pulse sequences of the brain and surrounding structures were obtained without and with intravenous contrast. CONTRAST:  10mL GADAVIST GADOBUTROL 1 MMOL/ML IV SOLN COMPARISON:  Head CT 08/20/2023 and MRI 08/05/2023 FINDINGS: Brain: Heterogeneously enhancing, partially necrotic lesions measuring up to 2.7 cm in the medial aspect of the left postcentral gyrus (series 11, image 47 and series 12, image 14) and 2.1 cm in the lateral aspect of the left precentral gyrus (series 11, image 33 and series 12, image 20) are stable to minimally larger than on the recent prior MRI. Extensive vasogenic edema in the left frontal and parietal lobes has mildly worsened from the prior MRI with increased rightward midline shift now measuring 7 mm. Mild dural enhancement along the left parietal convexity overlying the postcentral gyrus lesion is unchanged. A 1 cm peripherally enhancing lesion in the right occipital lobe is unchanged in size (series 11, image 23) with minimal surrounding edema which has slightly increased. A punctate focus of faint residual enhancement corresponding to a known left cerebellar lesion is unchanged (series 11, image 13). No acute infarct or extra-axial fluid collection is evident. There are chronic blood products associated with the left cerebral lesions. Patchy T2 hyperintensities elsewhere in the cerebral white matter bilaterally are unchanged and nonspecific but compatible  with mild-to-moderate chronic small vessel ischemic disease. There is partial effacement of the left lateral ventricle. There is no evidence of hydrocephalus. No new enhancing brain lesions are identified. Vascular: Major intracranial vascular flow voids are preserved. Skull and upper cervical spine: No suspicious marrow lesion. Sinuses/Orbits: Unremarkable orbits. Mild mucosal thickening in the paranasal sinuses. Clear mastoid air cells. Other: None. IMPRESSION: 1. Stable to minimally increased size of 2 left cerebral metastases since 08/05/2023. Worsening, extensive vasogenic edema with 7 mm of rightward midline shift. 2. Unchanged size of small right occipital and left cerebellar metastases. 3. No new metastases. Electronically Signed   By: Sebastian Ache M.D.   On: 08/21/2023 10:30   Overnight EEG with video Result Date: 08/21/2023 Charlsie Quest, MD     08/22/2023  7:25 AM Patient Name: KAMAURI DENARDO MRN: [id number] Epilepsy Attending: Charlsie Quest Referring Physician/Provider: Erick Blinks, MD Duration: 08/21/2023 0420 to 2304 Patient history: 68 y.o. 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  He was noted to have twitching of his right side concerning for a seizure. EEG to evaluate for seizure Level of alertness: Awake, asleep AEDs during EEG study: LEV Technical aspects: This EEG study was done with scalp electrodes positioned  according to the 10-20 International system of electrode placement. Electrical activity was reviewed with band pass filter of 1-70Hz , sensitivity of 7 uV/mm, display speed of 23mm/sec with a 60Hz  notched filter applied as appropriate. EEG data were recorded continuously and digitally stored.  Video monitoring was  available and reviewed as appropriate. Description: The posterior dominant rhythm consists of 9 Hz activity of moderate voltage (25-35 uV) seen predominantly in posterior head regions, symmetric and reactive to eye opening and eye closing. Sleep was characterized by vertex waves, sleep spindles (12 to 14 Hz), maximal frontocentral region. EEG showed intermittent 3 to 5 Hz theta-delta slowing in left temporal region. Hyperventilation and photic stimulation were not performed.  EEG was disconnected between 0906 to 1001 and between 1554 to 1845 ABNORMALITY - Intermittent slow, left temporal region. IMPRESSION: This study is suggestive of cortical dysfunction arising from left temporal region likely secondary to underlying structural abnormality/ tumor. No seizures or epileptiform discharges were seen throughout the recording. Charlsie Quest   CT HEAD CODE STROKE WO CONTRAST Result Date: 08/20/2023 CLINICAL DATA:  Code stroke.  Neuro deficit, acute, stroke suspected EXAM: CT HEAD WITHOUT CONTRAST TECHNIQUE: Contiguous axial images were obtained from the base of the skull through the vertex without intravenous contrast. RADIATION DOSE REDUCTION: This exam was performed according to the departmental dose-optimization program which includes automated exposure control, adjustment of the mA and/or kV according to patient size and/or use of iterative reconstruction technique. COMPARISON:  None Available. FINDINGS: Motion limited study.  Within this limitation: Brain: Increasing edema in the left frontoparietal region. No definite new acute hemorrhage. Small focus of hyperdensity in the left frontal region likely represents mineralization or hemorrhage with in the known metastasis, likely present on prior MRI. No obvious superimposed acute large vascular territory infarct. No hydrocephalus. Vascular: Not well assessed. Skull: No acute fracture. Sinuses/Orbits: Clear sinuses.  No acute orbital findings. Other: No mastoid  effusions. IMPRESSION: Increasing left frontoparietal edema and mass effect with 9 mm of right midline shift, likely related to worsening metastatic disease given findings on recent MRI head. Repeat MRI head with contrast could confirm and better assess if clinically warranted. Code stroke imaging results were communicated on 08/20/2023 at 11:15 pm to provider [facility] via telephone, who verbally acknowledged these results. Electronically Signed   By: Feliberto Harts M.D.   On: 08/20/2023 23:20   MR BRAIN W WO CONTRAST Result Date: 08/05/2023 CLINICAL DATA:  Metastatic lung cancer, restaging EXAM: MRI HEAD WITHOUT AND WITH CONTRAST TECHNIQUE: Multiplanar, multiecho pulse sequences of the brain and surrounding structures were obtained without and with intravenous contrast. CONTRAST:  9mL GADAVIST GADOBUTROL 1 MMOL/ML IV SOLN COMPARISON:  07/03/2023 FINDINGS: Brain: Redemonstrated peripherally enhancing mass in the medial left postcentral gyrus, which measures up to 2.4 x 2.2 x 1.8 cm (AP x TR x CC) (series 18, image 26 and series 16, image 143), previously 2.0 x 2.1 x 1.8 cm when remeasured similarly. Increased associated T2 hyperintense signal (series 9, image 59). Enhancing mass in the left precentral gyrus measures 1.6 x 1.3 x 1.7 cm (AP x TR x CC) (series 16, image 112 and series 18, image 32), previously 1.5 x 1.3 x 1.8 cm, overall unchanged. Unchanged surrounding T2 hyperintense signal. Minimal peripheral enhancement about the lesion in the right occipital lobe, which measures up to 0.9 x 0.8 x 0.6 cm (AP x TR x CC) (series 16, image 62 and series 18, image 12), previously 1.0 x 1.1 x 0.8 cm. Decreased surrounding  T2 hyperintense signal. Diminishing enhancement in a previously noted left cerebellar lesion (series 16, image 43). No new abnormal enhancement. No acute infarct, hemorrhage, or midline shift. No hydrocephalus or extra-axial collection. Vascular: Normal arterial flow voids. Normal arterial and  venous enhancement. Skull and upper cervical spine: Normal marrow signal. Sinuses/Orbits: Clear paranasal sinuses. No acute finding in the orbits. Other: The mastoid air cells are well aerated. IMPRESSION: 1. Slight interval increase in size of the peripherally enhancing mass in the medial left postcentral gyrus, with increased associated T2 hyperintense signal. 2. Unchanged size of the enhancing mass in the left precentral gyrus. 3. Decreased size of the enhancing lesion in the right occipital lobe, with decreased surrounding T2 hyperintense signal. 4. Diminishing enhancement in a previously noted left cerebellar lesion. 5. No new abnormal enhancement. Electronically Signed   By: Wiliam Ke M.D.   On: 08/05/2023 14:50

## 2023-10-21 NOTE — Assessment & Plan Note (Addendum)
 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  Labs are reviewed and discussed with patient. Proceed with  Durvalumab.

## 2023-10-26 ENCOUNTER — Other Ambulatory Visit: Payer: Self-pay | Admitting: Oncology

## 2023-10-26 ENCOUNTER — Inpatient Hospital Stay (HOSPITAL_BASED_OUTPATIENT_CLINIC_OR_DEPARTMENT_OTHER): Admitting: Internal Medicine

## 2023-10-26 ENCOUNTER — Inpatient Hospital Stay

## 2023-10-26 ENCOUNTER — Encounter: Payer: Self-pay | Admitting: Oncology

## 2023-10-26 ENCOUNTER — Other Ambulatory Visit: Payer: Self-pay

## 2023-10-26 ENCOUNTER — Telehealth: Payer: Self-pay

## 2023-10-26 DIAGNOSIS — C7931 Secondary malignant neoplasm of brain: Secondary | ICD-10-CM | POA: Diagnosis not present

## 2023-10-26 DIAGNOSIS — C3491 Malignant neoplasm of unspecified part of right bronchus or lung: Secondary | ICD-10-CM

## 2023-10-26 NOTE — Telephone Encounter (Signed)
 Called pt and offered in-person or telephone reconsultation with [facility]. Pt took in-person appt. Scheduled appt and pt notified to arrive early. Pt states understanding and plans to arrive with family member for support

## 2023-10-26 NOTE — Progress Notes (Signed)
 I connected with Joshua [name] on 10/26/23 at  2:15 PM EDT by telephone visit and verified that I am speaking with the correct person using two identifiers.  I discussed the limitations, risks, security and privacy concerns of performing an evaluation and management service by telemedicine and the availability of in-person appointments. I also discussed with the patient that there may be a patient responsible charge related to this service. The patient expressed understanding and agreed to proceed.   Other persons participating in the visit and their role in the encounter:  n/a  Patient's location:  Home Provider's location:  Office  Chief Complaint:  Malignant neoplasm metastatic to brain [facility])  History of Present Ilness: Joshua [name] reports no concerning clinical changes today.  He feels arguably some improvement with regards to his right side weakness following increased dose of decadron.  Currently dosing 4mg  twice per day.  Continues on Keppra, no further seizures.   Observations: Language and cognition at baseline  Imaging:  [facility] Clinician Interpretation: I have personally reviewed the CNS images as listed.  My interpretation, in the context of the patient's clinical presentation, is progressive disease  NM PET Metabolic Brain Result Date: 10/20/2023 CLINICAL DATA:  Dementia. 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 Full-ring PET imaging was performed from the vertex to skull base. CT data was obtained and used for attenuation correction and anatomic localization. Post processing fusion of PET data set with brain MRI 10/09/2023. FASTING BLOOD GLUCOSE:  Value: 88 mg/dl COMPARISON:  Brain MRI 10/09/2018 FINDINGS: Focus of hypermetabolic activity in the posterior lateral LEFT frontal lobe which is slightly greater than cortical metabolism (image 58). This corresponds to enhancing  lesion on comparison MRI. On coronal orientation the most intense portion the lesion along the craniad surface of the lesion. Thin rim of peripheral hypermetabolic activity associated with the high LEFT parietal enhancing lesion (image 24 through 31 PET data set). This rim of metabolic activity is slightly greater than adjacent normal cortical metabolic activity. IMPRESSION: Findings concerning for tumor recurrence within the LEFT posterior lateral frontal lobe and high LEFT parietal lobe. Electronically Signed   By: Genevive Bi M.D.   On: 10/20/2023 12:30   CT CHEST ABDOMEN PELVIS W CONTRAST Result Date: 10/18/2023 CLINICAL DATA:  Lung cancer restaging * Tracking Code: BO * EXAM: CT CHEST, ABDOMEN, AND PELVIS WITH CONTRAST TECHNIQUE: Multidetector CT imaging of the chest, abdomen and pelvis was performed following the standard protocol during bolus administration of intravenous contrast. RADIATION DOSE REDUCTION: This exam was performed according to the departmental dose-optimization program which includes automated exposure control, adjustment of the mA and/or kV according to patient size and/or use of iterative reconstruction technique. CONTRAST:  OMNIPAQUE IOHEXOL 300 MG/ML  SOLN COMPARISON:  06/25/2023 FINDINGS: CT CHEST FINDINGS Cardiovascular: Right chest port catheter. Scattered aortic atherosclerosis. Normal heart size. Scattered left and right coronary artery calcifications. No pericardial effusion. Mediastinum/Nodes: No enlarged mediastinal, hilar, or axillary lymph nodes. Thyroid gland, trachea, and esophagus demonstrate no significant findings. Lungs/Pleura: Unchanged post treatment/post radiation appearance of the right lung with perihilar and paramedian fibrosis and consolidation. Minimal paraseptal emphysema. No pleural effusion or pneumothorax. Musculoskeletal: No chest wall abnormality. No acute osseous findings. CT ABDOMEN PELVIS FINDINGS Hepatobiliary: No solid liver abnormality is  seen. Hepatic steatosis. No gallstones, gallbladder wall thickening, or biliary dilatation. Pancreas: Unremarkable. No pancreatic ductal dilatation or surrounding inflammatory changes. Spleen: Normal in size without  significant abnormality. Adrenals/Urinary Tract: Adrenal glands are unremarkable. Kidneys are normal, without renal calculi, solid lesion, or hydronephrosis. Bladder is unremarkable. Stomach/Bowel: Stomach is within normal limits. Appendix appears normal. No evidence of bowel wall thickening, distention, or inflammatory changes. Sigmoid diverticulosis. Vascular/Lymphatic: Aortic atherosclerosis. Partially imaged right superficial femoral and profunda system stenting. No enlarged abdominal or pelvic lymph nodes. Reproductive: No mass or other abnormality. Other: No abdominal wall hernia or abnormality. No ascites. Musculoskeletal: No acute osseous findings. IMPRESSION: 1. Unchanged post treatment/post radiation appearance of the right lung with perihilar and paramedian fibrosis and consolidation. 2. No evidence of lymphadenopathy or metastatic disease in the chest, abdomen, or pelvis. 3. Hepatic steatosis. 4. Coronary artery disease. Aortic Atherosclerosis (ICD10-I70.0) and Emphysema (ICD10-J43.9). Electronically Signed   By: Jearld Lesch M.D.   On: 10/18/2023 08:12   MR BRAIN W WO CONTRAST Result Date: 10/09/2023 CLINICAL DATA:  Brain/CNS neoplasm, assess treatment response. History of metastatic lung cancer. EXAM: MRI HEAD WITHOUT AND WITH CONTRAST TECHNIQUE: Multiplanar, multiecho pulse sequences of the brain and surrounding structures were obtained without and with intravenous contrast. CONTRAST:  10mL GADAVIST GADOBUTROL 1 MMOL/ML IV SOLN COMPARISON:  Head MRI 08/21/2023 FINDINGS: Brain: A heterogeneously enhancing, partially necrotic lesion in the posterior aspect of the left frontal operculum has an unchanged maximal oblique diameter of 2.2 cm on coronal images (series 16, image 18), however  the lesion appears slightly larger on axial and sagittal images (for example, 1.8 cm AP diameter on axial series 15, image 108 compared to 1.5 cm previously). A partially necrotic lesion in the left parietal lobe demonstrates more substantial enlargement and thickness of peripheral enhancement, now measuring up to 4.5 cm (series 15, image 144, previously 3 cm when measured in a similar fashion). This again extends to the dura. Extensive vasogenic edema in the left parietal and frontal lobes extending into the basal ganglia region has not significantly changed with unchanged 7 mm of rightward midline shift. A 1.0 cm peripherally enhancing lesion in the right occipital lobe is unchanged in size with unchanged minimal surrounding edema (series 15, image 59). Persistent punctate enhancement at the site of a known left cerebellar lesion has increased in intensity but not substantially enlarged, and there is no associated edema (series 15, image 38). No new enhancing brain lesion, acute infarct, or extra-axial fluid collection is identified. The ventricles are unchanged in size without evidence of hydrocephalus. There are chronic blood products associated with the 2 left cerebral lesions, and there is also an unchanged focus of chronic microhemorrhage at the mid to posterior left cingulate gyrus. Vascular: Major intracranial vascular flow voids are preserved. Skull and upper cervical spine: No suspicious marrow lesion. Sinuses/Orbits: Unremarkable orbits. Mild posterior right ethmoid air cell opacification. No significant mastoid fluid. Other: None. IMPRESSION: 1. Slight enlargement of the lesion in the posterior left frontal operculum and more substantial enlargement of the left parietal lesion with unchanged extensive edema and 7 mm of rightward midline shift. 2. No evidence of new intracranial metastases. Electronically Signed   By: Sebastian Ache M.D.   On: 10/09/2023 13:29   Assessment and Plan: Malignant neoplasm  metastatic to brain [facility])  Joshua [name] is clinically stable or improved with the higher dose decadron.  FDG-PET demonstrates hypermetabolism associated with both left frontal lesiosn, c/w likely organic progression.    Case was discussed in CNS tumor board this morning.  Discussed and recommended salvage radiosurgery to both lesions, with fractionation.  Given high risk of radiation necrosis, possibly  already concurrent with present syndrome, recommended concurrent avastin, IV 7.5-10mg /kg q2-3 weeks.  This could be added to his existing immunotherapy scheduled with Dr. Cathie Hoops.    We reviewed potential side effects, including hypertension, bleeding/clotting, wound healing impairment.   He is agreeable with proceeding with the above plan.    We will touch base with shortly following first infusion to begin tapering the decadron.    Follow Up Instructions: RTC ~2 weeks, can be virtual  I discussed the assessment and treatment plan with the patient.  The patient was provided an opportunity to ask questions and all were answered.  The patient agreed with the plan and demonstrated understanding of the instructions.    The patient was advised to call back or seek an in-person evaluation if the symptoms worsen or if the condition fails to improve as anticipated.    Henreitta Leber, MD   I provided 20 minutes of non face-to-face telephone visit time during this encounter, and > 50% was spent counseling as documented under my assessment & plan.

## 2023-10-27 ENCOUNTER — Telehealth: Payer: Self-pay | Admitting: Internal Medicine

## 2023-10-27 NOTE — Telephone Encounter (Signed)
 Patient scheduled appointments. Patient is aware of all appointment details.

## 2023-10-27 NOTE — Progress Notes (Signed)
 Location/Histology of Brain Tumor: Left Frontal   PET Metabolic Brain 10/20/2023: Focus of hypermetabolic activity in the posterior lateral LEFT frontal lobe which is slightly greater than cortical metabolism. This corresponds to enhancing lesion on comparison MRI.  Thin rim of peripheral hypermetabolic activity associated with the high LEFT parietal enhancing lesion. This rim of metabolic activity is slightly greater than adjacent normal cortical metabolic activity.   Past or anticipated interventions, if any, per neurosurgery:  Dr. Barbaraann Cao -Discussed and recommended salvage radiosurgery to both lesions, with fractionation.  -Given high risk of radiation necrosis, possibly already concurrent with present syndrome, recommended concurrent avastin, IV 7.5-10mg /kg q2-3 weeks.    Past or anticipated interventions, if any, per medical oncology:   Dose of Decadron, if applicable: 4 mg BID  Recent neurologic symptoms, if any:  Seizures: No Headaches: No Nausea: No Dizziness/ataxia: He reports occasional dizziness. Difficulty with hand coordination: He is having some trouble with fine motor movements. 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 Visual deficits/changes: No Confusion/Memory deficits:  Appetite:  Good   SAFETY ISSUES: Prior radiation? 10/09/2020-11/27/2020, SRS 10/29/2021, SRS/SBRT 11/13/2022,  Pacemaker/ICD? No Possible current pregnancy? N/a Is the patient on methotrexate? No  Additional Complaints / other details:

## 2023-10-28 ENCOUNTER — Ambulatory Visit
Admission: RE | Admit: 2023-10-28 | Discharge: 2023-10-28 | Disposition: A | Discharge status: Home / Self Care | Source: Ambulatory Visit | Attending: Radiation Oncology | Admitting: Radiation Oncology

## 2023-10-28 DIAGNOSIS — Z87891 Personal history of nicotine dependence: Secondary | ICD-10-CM | POA: Diagnosis not present

## 2023-10-28 DIAGNOSIS — C7931 Secondary malignant neoplasm of brain: Secondary | ICD-10-CM

## 2023-10-28 DIAGNOSIS — C3411 Malignant neoplasm of upper lobe, right bronchus or lung: Secondary | ICD-10-CM | POA: Diagnosis not present

## 2023-10-29 ENCOUNTER — Other Ambulatory Visit: Payer: Self-pay | Admitting: Radiation Therapy

## 2023-10-29 DIAGNOSIS — C7931 Secondary malignant neoplasm of brain: Secondary | ICD-10-CM

## 2023-10-29 NOTE — Progress Notes (Signed)
 Radiation Oncology         [phone] ________________________________  Name: Joshua [name] MRN: [id number]  Date: 10/28/2023  DOB: [birth year]  Follow-Up Visit Note  CC: Joshua Paradise, MD  Joshua Leber, MD  I connected with Joshua [name] on 10/28/23 at 11:05 AM EDT by telephone visit and verified that I am speaking with the correct person using two identifiers.  I discussed the limitations, risks, security and privacy concerns of performing an evaluation and management service by telemedicine and the availability of in-person appointments. I also discussed with the patient that there may be a patient responsible charge related to this service. The patient expressed understanding and agreed to proceed.    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  Previously he received SRS to a single lesion in [place].  Narrative: The patient underwent evaluation today and we had a good discussion about his diagnosis of brain metastasis.  We have been following his case closely with additional imaging and he has been discussed in multidisciplinary brain conference.  There is concern of recurrence within 2 enlarging lesions corresponding to previous areas treated with [facility] for brain metastasis.  We recently reviewed a PET scan which showed hypermetabolic activity associated with both of these suspicious lesions and the consensus from conference was to proceed with salvage SRS to these lesions.  Based on the size and location of the tumors, the plan is to proceed with a fractionated course.  The patient states that he is doing relatively well today.  He is doing better after he began steroids.  He has seen neuro oncology recently  for discussion of his improving symptoms and also to help coordinate his care.  He is agreeable with the above plan.                          ALLERGIES:  has no active allergies.  Meds: Current Outpatient Medications  Medication Sig Dispense Refill   aspirin EC 81 MG tablet Take 81 mg by mouth daily. Swallow whole.     clopidogrel (PLAVIX) 75 MG tablet TAKE 1 TABLET BY MOUTH DAILY 100 tablet 2   dexamethasone (DECADRON) 4 MG tablet Take 1 tablet (4 mg total) by mouth 2 (two) times daily. 60 tablet 0   levETIRAcetam (KEPPRA) 1000 MG tablet Take 1 tablet (1,000 mg total) by mouth 2 (two) times daily. 60 tablet 2   levothyroxine (LEVOXYL) 125 MCG tablet Take 1 tablet (125 mcg total) by mouth daily before breakfast. 90 tablet 0   rosuvastatin (CRESTOR) 40 MG tablet Take 1 tablet (40 mg total) by mouth daily. 90 tablet 3   No current facility-administered medications for this encounter.   Facility-Administered Medications Ordered in Other Encounters  Medication Dose Route Frequency Provider Last Rate Last Admin   albuterol (PROVENTIL) (2.5 MG/3ML) 0.083% nebulizer solution 2.5 mg  2.5 mg Nebulization Once Salena Saner, MD       heparin lock flush 100 unit/mL  500 Units Intracatheter Once PRN [name], MD        Physical Findings: The patient is in no acute distress. Patient is alert and oriented.  vitals were not taken for this visit. Marland Kitchen  Alert, no acute distress  Lab Findings: Lab Results  Component Value Date   WBC 10.4 10/21/2023   HGB 12.5 (L) 10/21/2023   HCT 38.4 (L) 10/21/2023   MCV 86.5 10/21/2023   PLT 200 10/21/2023     Radiographic Findings: NM PET Metabolic Brain Result Date: 10/20/2023 CLINICAL DATA:  Dementia. 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 Full-ring PET imaging was performed from the vertex to skull base. CT data was obtained and used for attenuation  correction and anatomic localization. Post processing fusion of PET data set with brain MRI 10/09/2023. FASTING BLOOD GLUCOSE:  Value: 88 mg/dl COMPARISON:  Brain MRI 10/09/2018 FINDINGS: Focus of hypermetabolic activity in the posterior lateral LEFT frontal lobe which is slightly greater than cortical metabolism (image 58). This corresponds to enhancing lesion on comparison MRI. On coronal orientation the most intense portion the lesion along the craniad surface of the lesion. Thin rim of peripheral hypermetabolic activity associated with the high LEFT parietal enhancing lesion (image 24 through 31 PET data set). This rim of metabolic activity is slightly greater than adjacent normal cortical metabolic activity. IMPRESSION: Findings concerning for tumor recurrence within the LEFT posterior lateral frontal lobe and high LEFT parietal lobe. Electronically Signed   By: Joshua [name] M.D.   On: 10/20/2023 12:30   CT CHEST ABDOMEN PELVIS W CONTRAST Result Date: 10/18/2023 CLINICAL DATA:  Lung cancer restaging * Tracking Code: BO * EXAM: CT CHEST, ABDOMEN, AND PELVIS WITH CONTRAST TECHNIQUE: Multidetector CT imaging of the chest, abdomen and pelvis was performed following the standard protocol during bolus administration of intravenous contrast. RADIATION DOSE REDUCTION: This exam was performed according to the departmental dose-optimization program which includes automated exposure control, adjustment of the mA and/or kV according to patient size and/or use of iterative reconstruction technique. CONTRAST:  OMNIPAQUE IOHEXOL 300 MG/ML  SOLN COMPARISON:  06/25/2023 FINDINGS: CT CHEST FINDINGS Cardiovascular: Right chest port catheter. Scattered aortic atherosclerosis. Normal heart size. Scattered left and right coronary artery calcifications. No pericardial effusion. Mediastinum/Nodes: No enlarged mediastinal, hilar, or axillary lymph nodes. Thyroid gland, trachea, and esophagus demonstrate no significant  findings. Lungs/Pleura: Unchanged post treatment/post radiation appearance of the right lung with perihilar and paramedian fibrosis and consolidation. Minimal paraseptal emphysema. No pleural effusion or pneumothorax. Musculoskeletal: No chest wall abnormality. No acute osseous findings. CT ABDOMEN PELVIS FINDINGS Hepatobiliary: No solid liver abnormality is seen. Hepatic steatosis. No gallstones, gallbladder wall thickening, or biliary dilatation. Pancreas: Unremarkable. No pancreatic ductal dilatation or surrounding inflammatory changes. Spleen: Normal in size without significant abnormality. Adrenals/Urinary Tract: Adrenal glands are unremarkable. Kidneys are normal, without renal calculi, solid lesion, or hydronephrosis. Bladder is unremarkable. Stomach/Bowel: Stomach is within normal limits. Appendix appears normal. No evidence of bowel wall thickening, distention, or inflammatory changes. Sigmoid diverticulosis. Vascular/Lymphatic: Aortic atherosclerosis. Partially imaged right superficial femoral and profunda system stenting. No enlarged abdominal or pelvic lymph nodes. Reproductive: No mass or other abnormality. Other: No abdominal wall hernia or abnormality. No ascites. Musculoskeletal: No acute osseous findings. IMPRESSION: 1. Unchanged post treatment/post radiation appearance of the right lung with perihilar and paramedian fibrosis and consolidation. 2. No evidence of lymphadenopathy or metastatic disease in the chest, abdomen, or pelvis. 3. Hepatic steatosis. 4. Coronary artery disease. Aortic Atherosclerosis (ICD10-I70.0) and Emphysema (ICD10-J43.9). Electronically Signed   By: Jearld Lesch M.D.   On: 10/18/2023 08:12   MR BRAIN W WO CONTRAST Result Date: 10/09/2023 CLINICAL DATA:  Brain/CNS neoplasm, assess treatment response. History of metastatic lung cancer. EXAM: MRI HEAD WITHOUT AND WITH CONTRAST TECHNIQUE: Multiplanar, multiecho pulse sequences of the brain and surrounding structures were  obtained without and with intravenous contrast. CONTRAST:  10mL GADAVIST GADOBUTROL 1 MMOL/ML IV SOLN COMPARISON:  Head MRI 08/21/2023 FINDINGS: Brain: A heterogeneously enhancing, partially necrotic lesion in the posterior aspect of the left frontal operculum has an unchanged maximal oblique diameter of 2.2 cm on coronal images (series 16, image 18), however the lesion appears slightly larger on axial and sagittal images (for example, 1.8 cm AP diameter on axial series 15, image 108 compared to 1.5 cm previously). A partially necrotic lesion in the left parietal lobe demonstrates more substantial enlargement and thickness of peripheral enhancement, now measuring up to 4.5 cm (series 15, image 144, previously 3 cm when measured in a similar fashion). This again extends to the dura. Extensive vasogenic edema in the left parietal and frontal lobes extending into the basal ganglia region has not significantly changed with unchanged 7 mm of rightward midline shift. A 1.0 cm peripherally enhancing lesion in the right occipital lobe is unchanged in size with unchanged minimal surrounding edema (series 15, image 59). Persistent punctate enhancement at the site of a known left cerebellar lesion has increased in intensity but not substantially enlarged, and there is no associated edema (series 15, image 38). No new enhancing brain lesion, acute infarct, or extra-axial fluid collection is identified. The ventricles are unchanged in size without evidence of hydrocephalus. There are chronic blood products associated with the 2 left cerebral lesions, and there is also an unchanged focus of chronic microhemorrhage at the mid to posterior left cingulate gyrus. Vascular: Major intracranial vascular flow voids are preserved. Skull and upper cervical spine: No suspicious marrow lesion. Sinuses/Orbits: Unremarkable orbits. Mild posterior right ethmoid air cell opacification. No significant mastoid fluid. Other: None. IMPRESSION: 1.  Slight enlargement of the lesion in the posterior left frontal operculum and more substantial enlargement of the left parietal lesion with unchanged extensive edema and 7 mm of rightward midline shift. 2. No evidence of new intracranial metastases. Electronically Signed   By: Sebastian Ache M.D.   On: 10/09/2023 13:29    Impression/ Plan:    The patient has evidence of recurrence into brain metastases previously treated with SRS, based on his recent PET scan.  This was discussed with him today with a recommendation to proceed with salvage fractionated SRS to these lesions.  I discussed the rationale of treatment as well as possible side effects and risks.  Neuro oncology has reached out to medical oncology to help coordinate the use of a Avastin to also reduce the chance of radionecrosis.  All of his questions were answered today.  He looks forward to proceeding with SRS.     Based on the patient's preference and ability to come to clinic, this encounter was conducted via telephone.  The patient has given verbal consent for this type of encounter. The time spent during this encounter was 35 minutes and more than 50% of that time was spent in the coordination of the patient's care. The attendants for this meeting included Dr. Mitzi Hansen, and the patient.  During the encounter Dr. Mitzi Hansen was located at [facility] Radiation Oncology Department.  The patient  was located at home.     Radene Gunning, M.D., Ph.D.

## 2023-11-02 ENCOUNTER — Telehealth: Admitting: Internal Medicine

## 2023-11-04 ENCOUNTER — Encounter: Payer: Self-pay | Admitting: Oncology

## 2023-11-04 ENCOUNTER — Inpatient Hospital Stay

## 2023-11-04 ENCOUNTER — Inpatient Hospital Stay: Admitting: Oncology

## 2023-11-04 ENCOUNTER — Telehealth: Payer: Self-pay | Admitting: Radiation Therapy

## 2023-11-04 ENCOUNTER — Ambulatory Visit

## 2023-11-04 VITALS — BP 151/75 | HR 72 | Resp 19

## 2023-11-04 VITALS — BP 159/85 | HR 74 | Temp 97.4°F | Resp 18 | Wt 214.2 lb

## 2023-11-04 DIAGNOSIS — C3491 Malignant neoplasm of unspecified part of right bronchus or lung: Secondary | ICD-10-CM

## 2023-11-04 DIAGNOSIS — E032 Hypothyroidism due to medicaments and other exogenous substances: Secondary | ICD-10-CM | POA: Diagnosis not present

## 2023-11-04 DIAGNOSIS — R569 Unspecified convulsions: Secondary | ICD-10-CM | POA: Diagnosis not present

## 2023-11-04 DIAGNOSIS — C7931 Secondary malignant neoplasm of brain: Secondary | ICD-10-CM

## 2023-11-04 DIAGNOSIS — Z5112 Encounter for antineoplastic immunotherapy: Secondary | ICD-10-CM | POA: Insufficient documentation

## 2023-11-04 LAB — CBC WITH DIFFERENTIAL/PLATELET
Abs Immature Granulocytes: 0.12 10*3/uL — ABNORMAL HIGH (ref 0.00–0.07)
Basophils Absolute: 0 10*3/uL (ref 0.0–0.1)
Basophils Relative: 0 %
Eosinophils Absolute: 0 10*3/uL (ref 0.0–0.5)
Eosinophils Relative: 0 %
HCT: 40.9 % (ref 39.0–52.0)
Hemoglobin: 13.1 g/dL (ref 13.0–17.0)
Immature Granulocytes: 1 %
Lymphocytes Relative: 5 %
Lymphs Abs: 0.6 10*3/uL — ABNORMAL LOW (ref 0.7–4.0)
MCH: 28.2 pg (ref 26.0–34.0)
MCHC: 32 g/dL (ref 30.0–36.0)
MCV: 88.1 fL (ref 80.0–100.0)
Monocytes Absolute: 0.6 10*3/uL (ref 0.1–1.0)
Monocytes Relative: 5 %
Neutro Abs: 10.1 10*3/uL — ABNORMAL HIGH (ref 1.7–7.7)
Neutrophils Relative %: 89 %
Platelets: 172 10*3/uL (ref 150–400)
RBC: 4.64 MIL/uL (ref 4.22–5.81)
RDW: 15.4 % (ref 11.5–15.5)
WBC: 11.4 10*3/uL — ABNORMAL HIGH (ref 4.0–10.5)
nRBC: 0 % (ref 0.0–0.2)

## 2023-11-04 LAB — COMPREHENSIVE METABOLIC PANEL WITH GFR
ALT: 58 U/L — ABNORMAL HIGH (ref 0–44)
AST: 21 U/L (ref 15–41)
Albumin: 3.5 g/dL (ref 3.5–5.0)
Alkaline Phosphatase: 49 U/L (ref 38–126)
Anion gap: 9 (ref 5–15)
BUN: 25 mg/dL — ABNORMAL HIGH (ref 8–23)
CO2: 25 mmol/L (ref 22–32)
Calcium: 8.6 mg/dL — ABNORMAL LOW (ref 8.9–10.3)
Chloride: 105 mmol/L (ref 98–111)
Creatinine, Ser: 0.89 mg/dL (ref 0.61–1.24)
GFR, Estimated: >60 mL/min (ref >60)
Glucose, Bld: 107 mg/dL — ABNORMAL HIGH (ref 70–99)
Potassium: 4.1 mmol/L (ref 3.5–5.1)
Sodium: 139 mmol/L (ref 135–145)
Total Bilirubin: 0.9 mg/dL (ref 0.0–1.2)
Total Protein: 6.4 g/dL — ABNORMAL LOW (ref 6.5–8.1)

## 2023-11-04 LAB — PROTEIN, URINE, RANDOM: Total Protein, Urine: 8 mg/dL

## 2023-11-04 MED ORDER — SODIUM CHLORIDE 0.9 % IV SOLN
Freq: Once | INTRAVENOUS | Status: AC
  Filled 2023-11-04: qty 250

## 2023-11-04 MED ORDER — SODIUM CHLORIDE 0.9 % IV SOLN
10.0000 mg/kg | Freq: Once | INTRAVENOUS | Status: AC
  Administered 2023-11-04: 1000 mg via INTRAVENOUS
  Filled 2023-11-04: qty 20

## 2023-11-04 MED ORDER — HEPARIN SOD (PORK) LOCK FLUSH 100 UNIT/ML IV SOLN
500.0000 [IU] | Freq: Once | INTRAVENOUS | Status: AC | PRN
  Administered 2023-11-04: 500 [IU]
  Filled 2023-11-04: qty 5

## 2023-11-04 MED ORDER — SODIUM CHLORIDE 0.9 % IV SOLN
10.0000 mg/kg | Freq: Once | INTRAVENOUS | Status: AC
  Administered 2023-11-04: 1000 mg via INTRAVENOUS
  Filled 2023-11-04: qty 8

## 2023-11-04 NOTE — Assessment & Plan Note (Signed)
Durvalumab treatment as planned.  

## 2023-11-04 NOTE — Assessment & Plan Note (Signed)
 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  Labs are reviewed and discussed with patient. Proceed with  Durvalumab.

## 2023-11-04 NOTE — Assessment & Plan Note (Signed)
 continue levothyroxine 125 mcg daily

## 2023-11-04 NOTE — Patient Instructions (Signed)
 [facility] - A DEPT OF [facility]  Discharge Instructions: Thank you for choosing [facility] to provide your oncology and hematology care.  If you have a lab appointment with the Cancer Center, please go directly to the Cancer Center and check in at the registration area.  Wear comfortable clothing and clothing appropriate for easy access to any Portacath or PICC line.   We strive to give you quality time with your provider. You may need to reschedule your appointment if you arrive late (15 or more minutes).  Arriving late affects you and other patients whose appointments are after yours.  Also, if you miss three or more appointments without notifying the office, you may be dismissed from the clinic at the provider's discretion.      For prescription refill requests, have your pharmacy contact our office and allow 72 hours for refills to be completed.    Today you received the following chemotherapy and/or immunotherapy agents imfiniz & Avastin   To help prevent nausea and vomiting after your treatment, we encourage you to take your nausea medication as directed.  BELOW ARE SYMPTOMS THAT SHOULD BE REPORTED IMMEDIATELY: *FEVER GREATER THAN 100.4 F (38 C) OR HIGHER *CHILLS OR SWEATING *NAUSEA AND VOMITING THAT IS NOT CONTROLLED WITH YOUR NAUSEA MEDICATION *UNUSUAL SHORTNESS OF BREATH *UNUSUAL BRUISING OR BLEEDING *URINARY PROBLEMS (pain or burning when urinating, or frequent urination) *BOWEL PROBLEMS (unusual diarrhea, constipation, pain near the anus) TENDERNESS IN MOUTH AND THROAT WITH OR WITHOUT PRESENCE OF ULCERS (sore throat, sores in mouth, or a toothache) UNUSUAL RASH, SWELLING OR PAIN  UNUSUAL VAGINAL DISCHARGE OR ITCHING   Items with * indicate a potential emergency and should be followed up as soon as possible or go to the Emergency Department if any problems should occur.  Please show the CHEMOTHERAPY ALERT CARD or  IMMUNOTHERAPY ALERT CARD at check-in to the Emergency Department and triage nurse.  Should you have questions after your visit or need to cancel or reschedule your appointment, please contact [facility] - A DEPT OF [facility]  [phone] and follow the prompts.  Office hours are 8:00 a.m. to 4:30 p.m. Monday - Friday. Please note that voicemails left after 4:00 p.m. may not be returned until the following business day.  We are closed weekends and major holidays. You have access to a nurse at all times for urgent questions. Please call the main number to the clinic [phone] and follow the prompts.  For any non-urgent questions, you may also contact your provider using MyChart. We now offer e-Visits for anyone 42 and older to request care online for non-urgent symptoms. For details visit [email or web address].   Also download the MyChart app! Go to the app store, search "MyChart", open the app, select [facility], and log in with your MyChart username and password.

## 2023-11-04 NOTE — Assessment & Plan Note (Addendum)
 Follow up with Dr. Mark Sil.  Continue keppra and course of  dexamethasone as advised by Dr. Mark Sil.   Recommend patient to avoid driving until cleared by neurology.

## 2023-11-04 NOTE — Telephone Encounter (Signed)
 I called to ensure pt is aware of the time change for his MRI on Friday 4/18. Pt did not answer. I left a detailed message with this information. My contact information was included for questions.   Axel Bohr R.T.(R)(T) Radiation Special Procedures Lead

## 2023-11-04 NOTE — Assessment & Plan Note (Addendum)
 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 NeuroOnc recommend Radiation and Bevacizumab  10mg /kg Q2 weeks. Discussed case with Dr. Mark Sil.   He is currently on Dexamethasone  4mg  BID, managed by Dr. Mark Sil.   Rationale and side effects of bevacizumab  were reviewed with patient. He agrees.  Proceed with Bevacizumab  today.

## 2023-11-04 NOTE — Progress Notes (Signed)
 Patient tolerated chemotherapy with no complaints voiced.  Side effects with management reviewed with understanding verbalized.  Port site clean and dry with no bruising or swelling noted at site.  Good blood return noted before and after administration of chemotherapy.  Band aid applied.  Patient left in satisfactory condition with VSS and no s/s of distress noted. All follow ups as scheduled.   Joshua [name] [employer]

## 2023-11-04 NOTE — Progress Notes (Addendum)
 Hematology/Oncology Progress note [phone] [phone]    CHIEF COMPLAINTS/REASON FOR VISIT:  Follow up for lung cancer  ASSESSMENT & PLAN:   Cancer Staging  Squamous cell carcinoma of right lung (HCC) Staging form: Lung, AJCC 8th Edition - Clinical stage from 09/20/2020: Stage IV (cT1b, cN2, cM1) - Signed by [name], MD on 12/13/2021   Squamous cell carcinoma of right lung (HCC) Durvalumab  was previously held due to pneumonitis.  Dec CT showed stable disease discussed with patient.  Labs are reviewed and discussed with patient. Proceed with Durvalumab .   Encounter for antineoplastic immunotherapy Durvalumab  treatment as planned.   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 NeuroOnc recommend Radiation and Bevacizumab  10mg /kg Q2 weeks. Discussed case with Dr. Mark Sil.   He is currently on Dexamethasone  4mg  BID, managed by Dr. Mark Sil.   Rationale and side effects of bevacizumab  were reviewed with patient. He agrees.  Proceed with Bevacizumab  today.    Seizure (HCC) Follow up with Dr. Mark Sil.  Continue keppra  and course of  dexamethasone  as advised by Dr. Mark Sil.   Recommend patient to avoid driving until cleared by neurology.    Follow up 2 weeks lab MD Durvalumab + Bevacizumab  All questions were answered. The patient knows to call the clinic with any problems, questions or concerns.  [name], MD, PhD [facility] Hematology Oncology 11/04/2023       HISTORY OF PRESENTING ILLNESS:  Joshua [name] is a 68 y.o. male who has above history reviewed by me today presents for follow up visit for Stage IV lung squamous cell carcinoma. Oncology History  Squamous cell carcinoma of right lung (HCC)  09/18/2020 Imaging   PET scan showed right upper lobe nodule maximal  SUV 11.5.  Hypermetabolic cluster right paratracheal adenopathy with maximum SUV up to 12.  No findings of metastatic disease to the neck/abdomen/pelvis or skeleton.   09/20/2020 Initial Diagnosis   Squamous cell carcinoma of right lung (HCC) NGS showed PD-L1 TPS 95%, LRP1B S942fs, PIK3CA E545K, RB1 c1422-2A>T, RIT1 T83_ A84del, STK11 P240fs, TPS53 R248W, TMB 19.44mut/mb, MS stable.    09/20/2020 Cancer Staging   Staging form: Lung, AJCC 8th Edition - Clinical stage from 09/20/2020: Stage IV (cT1b, cN2, cM1) - Signed by [name], MD on 12/13/2021   10/03/2020 Imaging   MRI brain with and without contrast showed no evidence of intracranial metastatic disease. Well-defined round lesion within the left parotid tail measuring approximately 2.3 x 1.8 x 1.8 cm   10/09/2020 - 11/27/2020 Radiation Therapy   concurrent chemoradiation   10/10/2020 - 10/31/2020 Chemotherapy   10/10/2020 concurrent chemoradiation.  carboplatin  AUC of 2 and Taxol  45 mg/m2 x 4.  Additional chemotherapy was held due to thrombocytopenia, neutropenia       12/31/2020 - 03/21/2022 Chemotherapy   Maintenance  Durvalumab  q14d      12/31/2020 -  Chemotherapy   Patient is on Treatment Plan : LUNG Durvalumab  (10) q14d     03/28/2021 Imaging   CT without contrast showed new linear right perihilar consolidation with associated traction bronchiectasis.  Likely postradiation changes.  Additional new peribronchial vascular/nodular groundglass opacities with areas of consolidation or seen primarily in the right upper and lower lobes, more distant from expected radiation fields. 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  Per my discussion with pulmonology Dr. 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     05/09/2021 Imaging   Repeat CT chest without contrast showed evolving radiation changes in the right perihilar  region likely accounting for interval increase to thickening of the posterior wall of the right upper lobe bronchus.  The more peripheral right lung groundglass opacities seen on most recent study have generally improved with exception of one peripherally right upper lobe which appears new.  These are likely inflammatory.  No findings highly suspicious for local recurrence.   05/30/2021 - 01/10/2022 Chemotherapy   Durvalumab  q14d     10/07/2021 Imaging   MRI brain with and without contrast showed 2 contrast-enhancing lesions, most consistent with metastatic disease.  Largest lesion is in the left frontal lobe and measures up to 1.3 cm with a large amount of surrounding edema and 9 mm rightward midline shift.  The other enhancing lesion was 4 mm in the right occipital lobe with a small amount of surrounding edema.  No acute or chronic hemorrhage.   10/07/2021 Progression   Headache after colonoscopy, progressive difficulty using her right arm.  Progressively worsening of speech. Stage IV lung cancer with brain metastasis.  started on Dexamethasone .  He was seen by neurosurgery and radiation oncology.  Recommendation is to proceed with SRS.  Patient was discharged with dexamethasone    10/16/2021 Imaging   PET scan showed postradiation changes in the right supra hilar lung without evidence of local recurrence.  No evidence of lung cancer recurrence or metastatic disease.  Single focus of intense metabolic activity associated with posterior left 11th rib likely secondary to recent fall/posttraumatic metabolic changes.  Frontal  uptake in the left frontal lobe corresponding to the known metastatic lesion in the brain   10/29/2021 -  Radiation Therapy   Brain Radiation [facility]   12/10/2021 Imaging   Brain MRI: Continued interval decrease in size of a left frontal lobe metastasis, now measuring 10 mm. Moderate surrounding edema has also decreased.  3-4 mm metastasis within the right occipital lobe, not  significantly changed in size. There is now a small focus of central non enhancement within this lesion. Increased edema. 3 mm enhancing metastasis within the high left parietal lobe. In retrospect, this was likely present as a subtle punctate focus of enhancement on the prior brain MRI of 10/22/2021.   01/18/2022 Imaging   CT chest abdomen pelvis w contrast 1. Stable post treatment changes in the right upper lobe medially with dense radiation fibrosis but no findings to suggest local recurrent tumor. 2. Significant progression of ill-defined ground-glass opacity, interstitial thickening and airspace nodularity in the right upper lobe and upper aspect of the right lower lobe. Findings could reflect an inflammatory or atypical infectious process, drug induced pneumonitis or interstitial spread of tumor.3. No mediastinal or hilar mass or adenopathy. 4. No findings for abdominal/pelvic metastatic disease or osseous metastatic disease.   02/19/2022 Imaging   MRI brain w wo contrast showed Three metastatic deposits in the brain appear smaller. Decreased edema in the left parietal lobe. No new lesions    04/04/2022 Imaging   CT chest w contrast 1. Stable radiation changes in the right perihilar region without evidence of local recurrence. 2. Interval improvement in the patchy ground-glass opacities previously demonstrated in both lungs, most consistent with a resolving infectious or inflammatory process (including immunotherapy-related pneumonitis). 3. No evidence of metastatic disease.   05/14/2022 Miscellaneous  He had a mechanical fall and fractured his left elbow. xrays showed a left elbow fracture  05/26/22 seen by orthopedic surgeon, cast applied.    05/27/2022 Imaging   MRI brain w wo contrast 1. All three known brain metastases have enlarged since August, with increasing regional edema. And there is a small new left anterior cerebellar 4-5 mm metastasis (was questionably punctate on the May  MRI). This constellation could reflect a combination of True Progression (Left Cerebellar lesion) and pseudoprogression/radionecrosis (3 existing mets).    2. No significant intracranial mass effect. 3. Underlying moderate cerebral white matter signal changes.   05/30/2022 Imaging   CT chest abdomen pelvis w contrast  1. 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 There is a stable small right pleural effusion, but no definitive findings to suggest residual or recurrent disease on today's examination. 2. No signs of metastatic disease in the abdomen or pelvis.  3. Aortic atherosclerosis, in addition to two-vessel coronary artery disease. Please note that although the presence of coronary artery calcium  documents the presence of coronary artery disease, the severity of this disease and any potential stenosis cannot be assessed on this non-gated CT examination. Assessment for potential risk factor modification, dietary therapy or pharmacologic therapy may be warranted, if clinically indicated. 4. Hepatic steatosis. 5. Colonic diverticulosis without evidence of acute diverticulitis at this time. 6. Incompletely imaged but apparently occluded vascular graft in the superior aspect of the right anterior thigh. Referral to vascular specialist for further clinical evaluation should be considered if clinically appropriate. 7. Additional incidental findings, as above.   07/22/2022 Imaging   MRI brain  1. No new or enlarging lesions. No intracranial disease progression. 2. Decreased size of treated lesions of the left frontoparietal junction and left postcentral gyrus. 3. Unchanged lesions in the left cerebellum and right occipital lobe.   10/15/2022 Imaging   MRI brain w wo contrast showed Interval increase in size of multiple intraparenchymal lesions with worsened surrounding vasogenic  edema. 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 No new lesions   11/05/2022 Imaging   MRI brain w wo contrast  1. Likely slight increase and right occipital and left cerebellar lesions. Otherwise, unchanged metastases, as above. No new lesions identified. 2. Partially imaged large probable lymph node in the left upper neck, apparent on prior PET CT.   11/13/2022 -  Radiation Therapy   Each of the sites were treated with SRS/SBRT/SRT-IMRT technique to 20 Gy in 1 fraction to each of the following sites: PTV_2_LCerebel_17mm PTV_3_RCerebel_72mm PTV_4_ROccip_51mm PTV_5_LParietal_22mm   11/27/2022 Imaging   CT chest abdomen pelvis w contrast showed  Improving bilateral scattered interstitial and ground-glass opacities. Small amount of residual areas in the extreme left lower lobe inferiorly. This specific area is slightly increased from previous but overall the amount of opacities is significantly improved. Recommend continued follow-up. No developing new mass lesion, fluid collection or lymph node enlargement. Stable posttreatment changes right perihilar in the lung. Fatty liver infiltration. Colonic diverticulosis. Improved tiny right pleural effusion   02/05/2023 Imaging   MRI brain with and without contrast Interval decrease in all of the metastatic lesions, detailed above.No new lesions identified.    Imaging   CT chest abdomen pelvis w contrast showed  1. 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 2. Trace right pleural fluid or thickening, new or  minimally increased. 3. Incidental findings, including: Aortic atherosclerosis (ICD10-I70.0), coronary artery atherosclerosis and emphysema (ICD10-J43.9). Hepatic steatosis.   06/25/2023 Imaging   CT chest abdomen pelvis w contrast showed 1. Unchanged post treatment/post radiation appearance of the  right lung, with perihilar and suprahilar paramedian fibrosis and consolidation. 2. No evidence of lymphadenopathy or metastatic disease in the chest, abdomen, or pelvis. 3. Hepatic steatosis. 4. Coronary artery disease.   Aortic Atherosclerosis (ICD10-I70.0) and Emphysema (ICD10-J43.9).   08/26/2023 [facility] Admission   hospitalization due to was observed speech and jerking movements on right side despite being on dexamethasone . Continuous EEG suggest cortical dysfunction arising from left temporal region.  No seizure or elective form discharges were seen. Patient was discharged on dexamethasone  4 mg, Keppra , lamotrigine .    11/04/2023 -  Chemotherapy   Add Bevacizumab  5mg /kg to Durvalumab .       chronic SOB,not worse.  Patient denies any additional seizure episodes.  No additional episodes.  He will start brain radiation soon. Currently on Dexamethasone  4mg  BID   Review of Systems  Constitutional:  Positive for fatigue. Negative for appetite change, chills, fever and unexpected weight change.  HENT:   Negative for hearing loss and voice change.   Eyes:  Negative for eye problems and icterus.  Respiratory:  Negative for chest tightness, cough and shortness of breath.   Cardiovascular:  Negative for chest pain and leg swelling.  Gastrointestinal:  Negative for abdominal distention and abdominal pain.  Endocrine: Negative for hot flashes.  Genitourinary:  Negative for difficulty urinating, dysuria and frequency.   Musculoskeletal:  Positive for gait problem. Negative for arthralgias.  Skin:  Negative for itching and rash.  Neurological:  Positive for dizziness and gait problem. Negative for extremity weakness, headaches, numbness and speech difficulty.  Hematological:  Negative for adenopathy. Does not bruise/bleed easily.  Psychiatric/Behavioral:  Negative for confusion.       MEDICAL HISTORY:  Past Medical History:  Diagnosis Date   Allergy    Cancer (HCC)    lung    COPD (chronic obstructive pulmonary disease) (HCC)    Dyspnea    former smoker. 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   Malignant neoplasm metastatic to brain [facility])    Peripheral vascular disease (HCC)    Pre-diabetes    Sleep apnea     SURGICAL HISTORY: Past Surgical History:  Procedure Laterality Date   ANTERIOR CERVICAL DECOMP/DISCECTOMY FUSION N/A 03/14/2020   Procedure: ANTERIOR CERVICAL DECOMPRESSION/DISCECTOMY FUSION 3 LEVELS C4-7;  Surgeon: Jodeen Munch, MD;  Location: [facility] ORS;  Service: Neurosurgery;  Laterality: N/A;   BACK SURGERY  2011   CENTRAL LINE INSERTION Right 09/10/2016   Procedure: CENTRAL LINE INSERTION;  Surgeon: Jackquelyn Mass, MD;  Location: [facility] ORS;  Service: Vascular;  Laterality: Right;   CERVICAL FUSION     C4-c7 on Mar 14, 2020   COLONOSCOPY  2013 ?   COLONOSCOPY N/A 09/09/2021   Procedure: COLONOSCOPY;  Surgeon: Quintin Buckle, DO;  Location: [facility] ENDOSCOPY;  Service: Gastroenterology;  Laterality: N/A;   CORONARY ANGIOPLASTY     ESOPHAGOGASTRODUODENOSCOPY N/A 09/09/2021   Procedure: ESOPHAGOGASTRODUODENOSCOPY (EGD);  Surgeon: Quintin Buckle, DO;  Location: [facility] ENDOSCOPY;  Service: Gastroenterology;  Laterality: N/A;   FEMORAL-POPLITEAL BYPASS GRAFT Right 09/10/2016   Procedure: BYPASS GRAFT FEMORAL-POPLITEAL ARTERY ( BELOW KNEE );  Surgeon: Jackquelyn Mass, MD;  Location: [facility] ORS;  Service: Vascular;  Laterality:  Right;   FEMORAL-TIBIAL BYPASS GRAFT Right 03/23/2020   Procedure: BYPASS GRAFT RIGHT FEMORAL- DISTAL TIBIAL ARTERY WITH DISTAL FLOW GRAFT;  Surgeon: Jackquelyn Mass, MD;  Location: [facility] ORS;  Service: Vascular;  Laterality: Right;   LEFT HEART CATH AND CORONARY ANGIOGRAPHY Left 05/16/2021   Procedure: LEFT HEART CATH AND CORONARY ANGIOGRAPHY;  Surgeon: Link Rice, MD;  Location: [facility] INVASIVE CV LAB;  Service: Cardiovascular;  Laterality: Left;    LOWER EXTREMITY ANGIOGRAPHY Right 11/18/2016   Procedure: Lower Extremity Angiography;  Surgeon: Jackquelyn Mass, MD;  Location: [facility] INVASIVE CV LAB;  Service: Cardiovascular;  Laterality: Right;   LOWER EXTREMITY ANGIOGRAPHY Right 12/09/2016   Procedure: Lower Extremity Angiography;  Surgeon: Jackquelyn Mass, MD;  Location: [facility] INVASIVE CV LAB;  Service: Cardiovascular;  Laterality: Right;   LOWER EXTREMITY ANGIOGRAPHY Right 11/15/2019   Procedure: LOWER EXTREMITY ANGIOGRAPHY;  Surgeon: Jackquelyn Mass, MD;  Location: [facility] INVASIVE CV LAB;  Service: Cardiovascular;  Laterality: Right;   LOWER EXTREMITY ANGIOGRAPHY Right 12/01/2019   Procedure: Lower Extremity Angiography;  Surgeon: Celso College, MD;  Location: [facility] INVASIVE CV LAB;  Service: Cardiovascular;  Laterality: Right;   LOWER EXTREMITY ANGIOGRAPHY Right 12/02/2019   Procedure: LOWER EXTREMITY ANGIOGRAPHY;  Surgeon: Jackquelyn Mass, MD;  Location: [facility] INVASIVE CV LAB;  Service: Cardiovascular;  Laterality: Right;   LOWER EXTREMITY ANGIOGRAPHY Right 03/23/2020   Procedure: Lower Extremity Angiography;  Surgeon: Jackquelyn Mass, MD;  Location: [facility] INVASIVE CV LAB;  Service: Cardiovascular;  Laterality: Right;   LOWER EXTREMITY ANGIOGRAPHY Right 09/25/2021   Procedure: Lower Extremity Angiography;  Surgeon: Jackquelyn Mass, MD;  Location: [facility] INVASIVE CV LAB;  Service: Cardiovascular;  Laterality: Right;   LOWER EXTREMITY ANGIOGRAPHY Right 09/26/2021   Procedure: Lower Extremity Angiography;  Surgeon: Celso College, MD;  Location: [facility] INVASIVE CV LAB;  Service: Cardiovascular;  Laterality: Right;   LOWER EXTREMITY ANGIOGRAPHY Right 04/08/2022   Procedure: Lower Extremity Angiography;  Surgeon: Jackquelyn Mass, MD;  Location: [facility] INVASIVE CV LAB;  Service: Cardiovascular;  Laterality: Right;   LOWER EXTREMITY INTERVENTION  11/18/2016   Procedure: Lower Extremity Intervention;  Surgeon: Jackquelyn Mass, MD;  Location: [facility]  INVASIVE CV LAB;  Service: Cardiovascular;;   PERIPHERAL VASCULAR CATHETERIZATION Right 01/02/2015   Procedure: Lower Extremity Angiography;  Surgeon: Jackquelyn Mass, MD;  Location: [facility] INVASIVE CV LAB;  Service: Cardiovascular;  Laterality: Right;   PERIPHERAL VASCULAR CATHETERIZATION Right 06/18/2015   Procedure: Lower Extremity Angiography;  Surgeon: Celso College, MD;  Location: [facility] INVASIVE CV LAB;  Service: Cardiovascular;  Laterality: Right;   PERIPHERAL VASCULAR CATHETERIZATION  06/18/2015   Procedure: Lower Extremity Intervention;  Surgeon: Celso College, MD;  Location: [facility] INVASIVE CV LAB;  Service: Cardiovascular;;   PERIPHERAL VASCULAR CATHETERIZATION N/A 10/09/2015   Procedure: Abdominal Aortogram w/Lower Extremity;  Surgeon: Jackquelyn Mass, MD;  Location: [facility] INVASIVE CV LAB;  Service: Cardiovascular;  Laterality: N/A;   PERIPHERAL VASCULAR CATHETERIZATION  10/09/2015   Procedure: Lower Extremity Intervention;  Surgeon: Jackquelyn Mass, MD;  Location: [facility] INVASIVE CV LAB;  Service: Cardiovascular;;   PERIPHERAL VASCULAR CATHETERIZATION Right 10/10/2015   Procedure: Lower Extremity Angiography;  Surgeon: Celso College, MD;  Location: [facility] INVASIVE CV LAB;  Service: Cardiovascular;  Laterality: Right;   PERIPHERAL VASCULAR CATHETERIZATION Right 10/10/2015   Procedure: Lower Extremity Intervention;  Surgeon: Celso College, MD;  Location: [facility] INVASIVE CV LAB;  Service: Cardiovascular;  Laterality: Right;   PERIPHERAL VASCULAR  CATHETERIZATION Right 12/03/2015   Procedure: Lower Extremity Angiography;  Surgeon: Celso College, MD;  Location: [facility] INVASIVE CV LAB;  Service: Cardiovascular;  Laterality: Right;   PERIPHERAL VASCULAR CATHETERIZATION Right 12/04/2015   Procedure: Lower Extremity Angiography;  Surgeon: Celso College, MD;  Location: [facility] INVASIVE CV LAB;  Service: Cardiovascular;  Laterality: Right;   PERIPHERAL VASCULAR CATHETERIZATION  12/04/2015   Procedure: Lower Extremity  Intervention;  Surgeon: Celso College, MD;  Location: [facility] INVASIVE CV LAB;  Service: Cardiovascular;;   PERIPHERAL VASCULAR CATHETERIZATION Right 06/30/2016   Procedure: Lower Extremity Angiography;  Surgeon: Celso College, MD;  Location: [facility] INVASIVE CV LAB;  Service: Cardiovascular;  Laterality: Right;   PERIPHERAL VASCULAR CATHETERIZATION Right 06/25/2016   Procedure: Lower Extremity Angiography;  Surgeon: Jackquelyn Mass, MD;  Location: [facility] INVASIVE CV LAB;  Service: Cardiovascular;  Laterality: Right;   PERIPHERAL VASCULAR CATHETERIZATION Right 07/01/2016   Procedure: Lower Extremity Angiography;  Surgeon: Jackquelyn Mass, MD;  Location: [facility] INVASIVE CV LAB;  Service: Cardiovascular;  Laterality: Right;   PERIPHERAL VASCULAR CATHETERIZATION  07/01/2016   Procedure: Lower Extremity Intervention;  Surgeon: Jackquelyn Mass, MD;  Location: [facility] INVASIVE CV LAB;  Service: Cardiovascular;;   PERIPHERAL VASCULAR CATHETERIZATION Right 08/01/2016   Procedure: Lower Extremity Angiography;  Surgeon: Jackquelyn Mass, MD;  Location: [facility] INVASIVE CV LAB;  Service: Cardiovascular;  Laterality: Right;   PORTA CATH INSERTION N/A 10/05/2020   Procedure: PORTA CATH INSERTION;  Surgeon: Jackquelyn Mass, MD;  Location: [facility] INVASIVE CV LAB;  Service: Cardiovascular;  Laterality: N/A;   stent placement in right leg Right    VIDEO BRONCHOSCOPY N/A 09/14/2020   Procedure: VIDEO BRONCHOSCOPY WITH FLUORO;  Surgeon: Marc Senior, MD;  Location: [facility] ORS;  Service: Cardiopulmonary;  Laterality: N/A;   VIDEO BRONCHOSCOPY WITH ENDOBRONCHIAL ULTRASOUND N/A 09/14/2020   Procedure: VIDEO BRONCHOSCOPY WITH ENDOBRONCHIAL ULTRASOUND;  Surgeon: Marc Senior, MD;  Location: [facility] ORS;  Service: Cardiopulmonary;  Laterality: N/A;    SOCIAL HISTORY: Social History   Socioeconomic History   Marital status: Single    Spouse name: Not on file   Number of children: Not on file   Years of education: Not on file    Highest education level: Not on file  Occupational History   Occupation: unemployed  Tobacco Use   Smoking status: Former    Current packs/day: 0.00    Average packs/day: 2.0 packs/day for 38.0 years (76.0 ttl pk-yrs)    Types: Cigarettes    Start date: 12/19/1976    Quit date: 12/20/2014    Years since quitting: 8.8   Smokeless tobacco: Never   Tobacco comments:    quit smoking in 2017 most smoked 2   Vaping Use   Vaping status: Former  Substance and Sexual Activity   Alcohol use: Not Currently   Drug use: No   Sexual activity: Yes  Other Topics Concern   Not on file  Social History Narrative   Not on file   Social Drivers of Health   Financial Resource Strain: Low Risk  (07/27/2023)   Received from [facility]   Overall Financial Resource Strain (CARDIA)    Difficulty of Paying Living Expenses: Not hard at all  Food Insecurity: No Food Insecurity (08/21/2023)   Hunger Vital Sign    Worried About Running Out of Food in the Last Year: Never true    Ran Out of Food in the Last Year: Never true  Transportation Needs:  No Transportation Needs (08/21/2023)   PRAPARE - Administrator, Civil Service (Medical): No    Lack of Transportation (Non-Medical): No  Physical Activity: Insufficiently Active (05/29/2023)   Exercise Vital Sign    Days of Exercise per Week: 2 days    Minutes of Exercise per Session: 10 min  Stress: No Stress Concern Present (05/29/2023)   [employer] of Occupational Health - Occupational Stress Questionnaire    Feeling of Stress : Not at all  Social Connections: Socially Isolated (08/21/2023)   Social Connection and Isolation Panel [NHANES]    Frequency of Communication with Friends and Family: More than three times a week    Frequency of Social Gatherings with Friends and Family: Three times a week    Attends Religious Services: Never    Active Member of Clubs or Organizations: No    Attends Banker Meetings:  Never    Marital Status: Never married  Intimate Partner Violence: Not At Risk (08/21/2023)   Humiliation, Afraid, Rape, and Kick questionnaire    Fear of Current or Ex-Partner: No    Emotionally Abused: No    Physically Abused: No    Sexually Abused: No    FAMILY HISTORY: Family History  Problem Relation Age of Onset   Diabetes Mother    Hypertension Mother    Heart murmur Mother    Leukemia Mother    Throat cancer Maternal Grandmother    Colon cancer Neg Hx    Breast cancer Neg Hx     ALLERGIES:  has no active allergies.  MEDICATIONS:  Current Outpatient Medications  Medication Sig Dispense Refill   aspirin  EC 81 MG tablet Take 81 mg by mouth daily. Swallow whole.     clopidogrel  (PLAVIX ) 75 MG tablet TAKE 1 TABLET BY MOUTH DAILY 100 tablet 2   dexamethasone  (DECADRON ) 4 MG tablet Take 1 tablet (4 mg total) by mouth 2 (two) times daily. 60 tablet 0   levETIRAcetam  (KEPPRA ) 1000 MG tablet Take 1 tablet (1,000 mg total) by mouth 2 (two) times daily. 60 tablet 2   levothyroxine  (LEVOXYL ) 125 MCG tablet Take 1 tablet (125 mcg total) by mouth daily before breakfast. 90 tablet 0   rosuvastatin  (CRESTOR ) 40 MG tablet Take 1 tablet (40 mg total) by mouth daily. (Patient not taking: Reported on 11/04/2023) 90 tablet 3   No current facility-administered medications for this visit.   Facility-Administered Medications Ordered in Other Visits  Medication Dose Route Frequency Provider Last Rate Last Admin   albuterol  (PROVENTIL ) (2.5 MG/3ML) 0.083% nebulizer solution 2.5 mg  2.5 mg Nebulization Once Marc Senior, MD       heparin  lock flush 100 unit/mL  500 Units Intracatheter Once PRN [name], MD         PHYSICAL EXAMINATION: ECOG PERFORMANCE STATUS: 1 - Symptomatic but completely ambulatory Vitals:   11/04/23 0912  BP: (!) 159/85  Pulse: 74  Resp: 18  Temp: (!) 97.4 F (36.3 C)  SpO2: 100%    Filed Weights   11/04/23 0912  Weight: 214 lb 3.2 oz (97.2 kg)      Physical Exam Constitutional:      General: He is not in acute distress.    Appearance: He is obese.     Comments: Walks with a cane  HENT:     Head: Normocephalic and atraumatic.  Eyes:     General: No scleral icterus. Cardiovascular:     Rate and Rhythm: Normal rate and regular  rhythm.  Pulmonary:     Effort: Pulmonary effort is normal. No respiratory distress.     Breath sounds: Normal breath sounds. No wheezing.  Abdominal:     General: There is no distension.     Palpations: Abdomen is soft.     Tenderness: There is no abdominal tenderness.  Musculoskeletal:     Cervical back: Normal range of motion and neck supple.  Skin:    General: Skin is warm and dry.     Findings: No erythema.  Neurological:     Mental Status: He is alert and oriented to person, place, and time. Mental status is at baseline.     Cranial Nerves: No cranial nerve deficit.     Motor: No abnormal muscle tone.  Psychiatric:        Mood and Affect: Mood and affect normal.      LABORATORY DATA:  I have reviewed the data as listed    Latest Ref Rng & Units 11/04/2023    8:50 AM 10/21/2023    1:05 PM 10/07/2023    1:18 PM  CBC  WBC 4.0 - 10.5 K/uL 11.4  10.4  7.0   Hemoglobin 13.0 - 17.0 g/dL 19.1  47.8  29.5   Hematocrit 39.0 - 52.0 % 40.9  38.4  39.9   Platelets 150 - 400 K/uL 172  200  221       Latest Ref Rng & Units 11/04/2023    8:50 AM 10/21/2023    1:05 PM 10/07/2023    1:18 PM  CMP  Glucose 70 - 99 mg/dL 621  308  657   BUN 8 - 23 mg/dL 25  29  24    Creatinine 0.61 - 1.24 mg/dL 8.46  9.62  9.52   Sodium 135 - 145 mmol/L 139  137  138   Potassium 3.5 - 5.1 mmol/L 4.1  3.5  3.5   Chloride 98 - 111 mmol/L 105  103  105   CO2 22 - 32 mmol/L 25  25  25    Calcium  8.9 - 10.3 mg/dL 8.6  8.9  8.6   Total Protein 6.5 - 8.1 g/dL 6.4  6.7  6.7   Total Bilirubin 0.0 - 1.2 mg/dL 0.9  0.7  0.7   Alkaline Phos 38 - 126 U/L 49  60  62   AST 15 - 41 U/L 21  30  18    ALT 0 - 44 U/L 58  41  20       RADIOGRAPHIC STUDIES: I have personally reviewed the radiological images as listed and agreed with the findings in the report. NM PET Metabolic Brain Result Date: 10/20/2023 CLINICAL DATA:  Dementia. 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 Full-ring PET imaging was performed from the vertex to skull base. CT data was obtained and used for attenuation correction and anatomic localization. Post processing fusion of PET data set with brain MRI 10/09/2023. FASTING BLOOD GLUCOSE:  Value: 88 mg/dl COMPARISON:  Brain MRI 10/09/2018 FINDINGS: Focus of hypermetabolic activity in the posterior lateral LEFT frontal lobe which is slightly greater than cortical metabolism (image 58). This corresponds to enhancing lesion on comparison MRI. On coronal orientation the most intense portion the lesion along the craniad surface of the lesion. Thin rim of peripheral hypermetabolic activity associated with the high LEFT parietal enhancing lesion (image 24 through 31 PET data set). This rim of metabolic activity is slightly greater  than adjacent normal cortical metabolic activity. IMPRESSION: Findings concerning for tumor recurrence within the LEFT posterior lateral frontal lobe and high LEFT parietal lobe. Electronically Signed   By: Deboraha Fallow M.D.   On: 10/20/2023 12:30   CT CHEST ABDOMEN PELVIS W CONTRAST Result Date: 10/18/2023 CLINICAL DATA:  Lung cancer restaging * Tracking Code: BO * EXAM: CT CHEST, ABDOMEN, AND PELVIS WITH CONTRAST TECHNIQUE: Multidetector CT imaging of the chest, abdomen and pelvis was performed following the standard protocol during bolus administration of intravenous contrast. RADIATION DOSE REDUCTION: This exam was performed according to the departmental dose-optimization program which includes automated exposure control, adjustment of the mA and/or kV according to patient size and/or use  of iterative reconstruction technique. CONTRAST:  OMNIPAQUE  IOHEXOL  300 MG/ML  SOLN COMPARISON:  06/25/2023 FINDINGS: CT CHEST FINDINGS Cardiovascular: Right chest port catheter. Scattered aortic atherosclerosis. Normal heart size. Scattered left and right coronary artery calcifications. No pericardial effusion. Mediastinum/Nodes: No enlarged mediastinal, hilar, or axillary lymph nodes. Thyroid  gland, trachea, and esophagus demonstrate no significant findings. Lungs/Pleura: Unchanged post treatment/post radiation appearance of the right lung with perihilar and paramedian fibrosis and consolidation. Minimal paraseptal emphysema. No pleural effusion or pneumothorax. Musculoskeletal: No chest wall abnormality. No acute osseous findings. CT ABDOMEN PELVIS FINDINGS Hepatobiliary: No solid liver abnormality is seen. Hepatic steatosis. No gallstones, gallbladder wall thickening, or biliary dilatation. Pancreas: Unremarkable. No pancreatic ductal dilatation or surrounding inflammatory changes. Spleen: Normal in size without significant abnormality. Adrenals/Urinary Tract: Adrenal glands are unremarkable. Kidneys are normal, without renal calculi, solid lesion, or hydronephrosis. Bladder is unremarkable. Stomach/Bowel: Stomach is within normal limits. Appendix appears normal. No evidence of bowel wall thickening, distention, or inflammatory changes. Sigmoid diverticulosis. Vascular/Lymphatic: Aortic atherosclerosis. Partially imaged right superficial femoral and profunda system stenting. No enlarged abdominal or pelvic lymph nodes. Reproductive: No mass or other abnormality. Other: No abdominal wall hernia or abnormality. No ascites. Musculoskeletal: No acute osseous findings. IMPRESSION: 1. Unchanged post treatment/post radiation appearance of the right lung with perihilar and paramedian fibrosis and consolidation. 2. No evidence of lymphadenopathy or metastatic disease in the chest, abdomen, or pelvis. 3. Hepatic  steatosis. 4. Coronary artery disease. Aortic Atherosclerosis (ICD10-I70.0) and Emphysema (ICD10-J43.9). Electronically Signed   By: Fredricka Jenny M.D.   On: 10/18/2023 08:12   MR BRAIN W WO CONTRAST Result Date: 10/09/2023 CLINICAL DATA:  Brain/CNS neoplasm, assess treatment response. History of metastatic lung cancer. EXAM: MRI HEAD WITHOUT AND WITH CONTRAST TECHNIQUE: Multiplanar, multiecho pulse sequences of the brain and surrounding structures were obtained without and with intravenous contrast. CONTRAST:  10mL GADAVIST  GADOBUTROL  1 MMOL/ML IV SOLN COMPARISON:  Head MRI 08/21/2023 FINDINGS: Brain: A heterogeneously enhancing, partially necrotic lesion in the posterior aspect of the left frontal operculum has an unchanged maximal oblique diameter of 2.2 cm on coronal images (series 16, image 18), however the lesion appears slightly larger on axial and sagittal images (for example, 1.8 cm AP diameter on axial series 15, image 108 compared to 1.5 cm previously). A partially necrotic lesion in the left parietal lobe demonstrates more substantial enlargement and thickness of peripheral enhancement, now measuring up to 4.5 cm (series 15, image 144, previously 3 cm when measured in a similar fashion). This again extends to the dura. Extensive vasogenic edema in the left parietal and frontal lobes extending into the basal ganglia region has not significantly changed with unchanged 7 mm of rightward midline shift. A 1.0 cm peripherally enhancing lesion in the right occipital lobe is unchanged in size  with unchanged minimal surrounding edema (series 15, image 59). Persistent punctate enhancement at the site of a known left cerebellar lesion has increased in intensity but not substantially enlarged, and there is no associated edema (series 15, image 38). No new enhancing brain lesion, acute infarct, or extra-axial fluid collection is identified. The ventricles are unchanged in size without evidence of hydrocephalus.  There are chronic blood products associated with the 2 left cerebral lesions, and there is also an unchanged focus of chronic microhemorrhage at the mid to posterior left cingulate gyrus. Vascular: Major intracranial vascular flow voids are preserved. Skull and upper cervical spine: No suspicious marrow lesion. Sinuses/Orbits: Unremarkable orbits. Mild posterior right ethmoid air cell opacification. No significant mastoid fluid. Other: None. IMPRESSION: 1. Slight enlargement of the lesion in the posterior left frontal operculum and more substantial enlargement of the left parietal lesion with unchanged extensive edema and 7 mm of rightward midline shift. 2. No evidence of new intracranial metastases. Electronically Signed   By: Aundra Lee M.D.   On: 10/09/2023 13:29   MR BRAIN W WO CONTRAST Result Date: 08/21/2023 CLINICAL DATA:  Metastatic disease evaluation. History of metastatic lung cancer. EXAM: MRI HEAD WITHOUT AND WITH CONTRAST TECHNIQUE: Multiplanar, multiecho pulse sequences of the brain and surrounding structures were obtained without and with intravenous contrast. CONTRAST:  10mL GADAVIST  GADOBUTROL  1 MMOL/ML IV SOLN COMPARISON:  Head CT 08/20/2023 and MRI 08/05/2023 FINDINGS: Brain: Heterogeneously enhancing, partially necrotic lesions measuring up to 2.7 cm in the medial aspect of the left postcentral gyrus (series 11, image 47 and series 12, image 14) and 2.1 cm in the lateral aspect of the left precentral gyrus (series 11, image 33 and series 12, image 20) are stable to minimally larger than on the recent prior MRI. Extensive vasogenic edema in the left frontal and parietal lobes has mildly worsened from the prior MRI with increased rightward midline shift now measuring 7 mm. Mild dural enhancement along the left parietal convexity overlying the postcentral gyrus lesion is unchanged. A 1 cm peripherally enhancing lesion in the right occipital lobe is unchanged in size (series 11, image 23) with  minimal surrounding edema which has slightly increased. A punctate focus of faint residual enhancement corresponding to a known left cerebellar lesion is unchanged (series 11, image 13). No acute infarct or extra-axial fluid collection is evident. There are chronic blood products associated with the left cerebral lesions. Patchy T2 hyperintensities elsewhere in the cerebral white matter bilaterally are unchanged and nonspecific but compatible with mild-to-moderate chronic small vessel ischemic disease. There is partial effacement of the left lateral ventricle. There is no evidence of hydrocephalus. No new enhancing brain lesions are identified. Vascular: Major intracranial vascular flow voids are preserved. Skull and upper cervical spine: No suspicious marrow lesion. Sinuses/Orbits: Unremarkable orbits. Mild mucosal thickening in the paranasal sinuses. Clear mastoid air cells. Other: None. IMPRESSION: 1. Stable to minimally increased size of 2 left cerebral metastases since 08/05/2023. Worsening, extensive vasogenic edema with 7 mm of rightward midline shift. 2. Unchanged size of small right occipital and left cerebellar metastases. 3. No new metastases. Electronically Signed   By: Aundra Lee M.D.   On: 08/21/2023 10:30   Overnight EEG with video Result Date: 08/21/2023 Arleene Lack, MD     08/22/2023  7:25 AM Patient Name: Joshua [name] MRN: [id number] Epilepsy Attending: Arleene Lack Referring Physician/Provider: Khaliqdina, Salman, MD Duration: 08/21/2023 0420 to 2304 Patient history: 68 y.o. 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  He was noted to have twitching of his right side concerning for a seizure. EEG to evaluate for seizure Level  of alertness: Awake, asleep AEDs during EEG study: LEV Technical aspects: This EEG study was done with scalp electrodes positioned according to the 10-20 International system of electrode placement. Electrical activity was reviewed with band pass filter of 1-70Hz , sensitivity of 7 uV/mm, display speed of 87mm/sec with a 60Hz  notched filter applied as appropriate. EEG data were recorded continuously and digitally stored.  Video monitoring was available and reviewed as appropriate. Description: The posterior dominant rhythm consists of 9 Hz activity of moderate voltage (25-35 uV) seen predominantly in posterior head regions, symmetric and reactive to eye opening and eye closing. Sleep was characterized by vertex waves, sleep spindles (12 to 14 Hz), maximal frontocentral region. EEG showed intermittent 3 to 5 Hz theta-delta slowing in left temporal region. Hyperventilation and photic stimulation were not performed.  EEG was disconnected between 0906 to 1001 and between 1554 to 1845 ABNORMALITY - Intermittent slow, left temporal region. IMPRESSION: This study is suggestive of cortical dysfunction arising from left temporal region likely secondary to underlying structural abnormality/ tumor. No seizures or epileptiform discharges were seen throughout the recording. Arleene Lack   CT HEAD CODE STROKE WO CONTRAST Result Date: 08/20/2023 CLINICAL DATA:  Code stroke.  Neuro deficit, acute, stroke suspected EXAM: CT HEAD WITHOUT CONTRAST TECHNIQUE: Contiguous axial images were obtained from the base of the skull through the vertex without intravenous contrast. RADIATION DOSE REDUCTION: This exam was performed according to the departmental dose-optimization program which includes automated exposure control, adjustment of the mA and/or kV according to patient size and/or use of iterative reconstruction technique. COMPARISON:  None Available. FINDINGS: Motion limited study.  Within this limitation: Brain: Increasing edema  in the left frontoparietal region. No definite new acute hemorrhage. Small focus of hyperdensity in the left frontal region likely represents mineralization or hemorrhage with in the known metastasis, likely present on prior MRI. No obvious superimposed acute large vascular territory infarct. No hydrocephalus. Vascular: Not well assessed. Skull: No acute fracture. Sinuses/Orbits: Clear sinuses.  No acute orbital findings. Other: No mastoid effusions. IMPRESSION: Increasing left frontoparietal edema and mass effect with 9 mm of right midline shift, likely related to worsening metastatic disease given findings on recent MRI head. Repeat MRI head with contrast could confirm and better assess if clinically warranted. Code stroke imaging results were communicated on 08/20/2023 at 11:15 pm to provider [facility] via telephone, who verbally acknowledged these results. Electronically Signed   By: Stevenson Elbe M.D.   On: 08/20/2023 23:20

## 2023-11-05 ENCOUNTER — Other Ambulatory Visit: Payer: Self-pay

## 2023-11-05 ENCOUNTER — Inpatient Hospital Stay: Admitting: Internal Medicine

## 2023-11-05 DIAGNOSIS — C7931 Secondary malignant neoplasm of brain: Secondary | ICD-10-CM

## 2023-11-05 MED ORDER — DEXAMETHASONE 1 MG PO TABS
ORAL_TABLET | ORAL | 0 refills | Status: AC
  Filled 2023-11-05: qty 42, 21d supply, fill #0

## 2023-11-05 NOTE — Progress Notes (Signed)
 Has armband been applied?  Yes.    Does patient have an allergy to IV contrast dye?: No.   Has patient ever received premedication for IV contrast dye?: No.   Does patient take metformin ?: No.  If patient does take metformin  when was the last dose: NA   Date of lab work: 11/04/2023 BUN: 25 CR: 0.89 eGfr: >60  IV site: Right port accessed.   Has IV site been added to flowsheet?  Yes.    BP (!) 158/86 (BP Location: Left Arm)   Pulse 79   Temp (!) 97.5 F (36.4 C) (Oral)   Resp 16   Wt 215 lb (97.5 kg)   SpO2 99%   BMI 29.99 kg/m

## 2023-11-05 NOTE — Progress Notes (Signed)
 I connected with [name] on 11/05/23 at  2:00 PM EDT by telephone visit and verified that I am speaking with the correct person using two identifiers.  I discussed the limitations, risks, security and privacy concerns of performing an evaluation and management service by telemedicine and the availability of in-person appointments. I also discussed with the patient that there may be a patient responsible charge related to this service. The patient expressed understanding and agreed to proceed.   Other persons participating in the visit and their role in the encounter:  n/a  Patient's location:  Home Provider's location:  Office  Chief Complaint:  Malignant neoplasm metastatic to brain [facility]) - Plan: MR BRAIN W WO CONTRAST  History of Present Ilness: MAJD TISSUE reports no clinical changes today.  Did well with the initial avastin infusion yesterday, no complications.  Has radiation upcoming with Dr. Mitzi Hansen, CT-sim.  Currently dosing 4mg  twice per day decadron.  Continues on Keppra, no further seizures.   Observations: Language and cognition at baseline  Imaging:  [facility] Clinician Interpretation: I have personally reviewed the CNS images as listed.  My interpretation, in the context of the patient's clinical presentation, is progressive disease  NM PET Metabolic Brain Result Date: 10/20/2023 CLINICAL DATA:  Dementia. 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 Full-ring PET imaging was performed from the vertex to skull base. CT data was obtained and used for attenuation correction and anatomic localization. Post processing fusion of PET data set with brain MRI 10/09/2023. FASTING BLOOD GLUCOSE:  Value: 88 mg/dl COMPARISON:  Brain MRI 10/09/2018 FINDINGS: Focus of hypermetabolic activity in the posterior lateral LEFT frontal lobe which is slightly greater than cortical metabolism (image  58). This corresponds to enhancing lesion on comparison MRI. On coronal orientation the most intense portion the lesion along the craniad surface of the lesion. Thin rim of peripheral hypermetabolic activity associated with the high LEFT parietal enhancing lesion (image 24 through 31 PET data set). This rim of metabolic activity is slightly greater than adjacent normal cortical metabolic activity. IMPRESSION: Findings concerning for tumor recurrence within the LEFT posterior lateral frontal lobe and high LEFT parietal lobe. Electronically Signed   By: Genevive Bi M.D.   On: 10/20/2023 12:30   CT CHEST ABDOMEN PELVIS W CONTRAST Result Date: 10/18/2023 CLINICAL DATA:  Lung cancer restaging * Tracking Code: BO * EXAM: CT CHEST, ABDOMEN, AND PELVIS WITH CONTRAST TECHNIQUE: Multidetector CT imaging of the chest, abdomen and pelvis was performed following the standard protocol during bolus administration of intravenous contrast. RADIATION DOSE REDUCTION: This exam was performed according to the departmental dose-optimization program which includes automated exposure control, adjustment of the mA and/or kV according to patient size and/or use of iterative reconstruction technique. CONTRAST:  OMNIPAQUE IOHEXOL 300 MG/ML  SOLN COMPARISON:  06/25/2023 FINDINGS: CT CHEST FINDINGS Cardiovascular: Right chest port catheter. Scattered aortic atherosclerosis. Normal heart size. Scattered left and right coronary artery calcifications. No pericardial effusion. Mediastinum/Nodes: No enlarged mediastinal, hilar, or axillary lymph nodes. Thyroid gland, trachea, and esophagus demonstrate no significant findings. Lungs/Pleura: Unchanged post treatment/post radiation appearance of the right lung with perihilar and paramedian fibrosis and consolidation. Minimal paraseptal emphysema. No pleural effusion or pneumothorax. Musculoskeletal: No chest wall abnormality. No acute osseous findings. CT ABDOMEN PELVIS FINDINGS  Hepatobiliary: No solid liver abnormality is seen. Hepatic steatosis. No gallstones, gallbladder wall thickening, or biliary dilatation. Pancreas: Unremarkable. No pancreatic ductal dilatation or  surrounding inflammatory changes. Spleen: Normal in size without significant abnormality. Adrenals/Urinary Tract: Adrenal glands are unremarkable. Kidneys are normal, without renal calculi, solid lesion, or hydronephrosis. Bladder is unremarkable. Stomach/Bowel: Stomach is within normal limits. Appendix appears normal. No evidence of bowel wall thickening, distention, or inflammatory changes. Sigmoid diverticulosis. Vascular/Lymphatic: Aortic atherosclerosis. Partially imaged right superficial femoral and profunda system stenting. No enlarged abdominal or pelvic lymph nodes. Reproductive: No mass or other abnormality. Other: No abdominal wall hernia or abnormality. No ascites. Musculoskeletal: No acute osseous findings. IMPRESSION: 1. Unchanged post treatment/post radiation appearance of the right lung with perihilar and paramedian fibrosis and consolidation. 2. No evidence of lymphadenopathy or metastatic disease in the chest, abdomen, or pelvis. 3. Hepatic steatosis. 4. Coronary artery disease. Aortic Atherosclerosis (ICD10-I70.0) and Emphysema (ICD10-J43.9). Electronically Signed   By: Jearld Lesch M.D.   On: 10/18/2023 08:12   MR BRAIN W WO CONTRAST Result Date: 10/09/2023 CLINICAL DATA:  Brain/CNS neoplasm, assess treatment response. History of metastatic lung cancer. EXAM: MRI HEAD WITHOUT AND WITH CONTRAST TECHNIQUE: Multiplanar, multiecho pulse sequences of the brain and surrounding structures were obtained without and with intravenous contrast. CONTRAST:  10mL GADAVIST GADOBUTROL 1 MMOL/ML IV SOLN COMPARISON:  Head MRI 08/21/2023 FINDINGS: Brain: A heterogeneously enhancing, partially necrotic lesion in the posterior aspect of the left frontal operculum has an unchanged maximal oblique diameter of 2.2 cm on  coronal images (series 16, image 18), however the lesion appears slightly larger on axial and sagittal images (for example, 1.8 cm AP diameter on axial series 15, image 108 compared to 1.5 cm previously). A partially necrotic lesion in the left parietal lobe demonstrates more substantial enlargement and thickness of peripheral enhancement, now measuring up to 4.5 cm (series 15, image 144, previously 3 cm when measured in a similar fashion). This again extends to the dura. Extensive vasogenic edema in the left parietal and frontal lobes extending into the basal ganglia region has not significantly changed with unchanged 7 mm of rightward midline shift. A 1.0 cm peripherally enhancing lesion in the right occipital lobe is unchanged in size with unchanged minimal surrounding edema (series 15, image 59). Persistent punctate enhancement at the site of a known left cerebellar lesion has increased in intensity but not substantially enlarged, and there is no associated edema (series 15, image 38). No new enhancing brain lesion, acute infarct, or extra-axial fluid collection is identified. The ventricles are unchanged in size without evidence of hydrocephalus. There are chronic blood products associated with the 2 left cerebral lesions, and there is also an unchanged focus of chronic microhemorrhage at the mid to posterior left cingulate gyrus. Vascular: Major intracranial vascular flow voids are preserved. Skull and upper cervical spine: No suspicious marrow lesion. Sinuses/Orbits: Unremarkable orbits. Mild posterior right ethmoid air cell opacification. No significant mastoid fluid. Other: None. IMPRESSION: 1. Slight enlargement of the lesion in the posterior left frontal operculum and more substantial enlargement of the left parietal lesion with unchanged extensive edema and 7 mm of rightward midline shift. 2. No evidence of new intracranial metastases. Electronically Signed   By: Sebastian Ache M.D.   On: 10/09/2023 13:29    Assessment and Plan: Malignant neoplasm metastatic to brain [facility]) - Plan: MR BRAIN W WO CONTRAST  Arrington A Vecchio is clinically stable today.    Recommended decreasing decadron by 1mg  each week, starting with 3mg  daily next week.  Dose may be modified if focal symptoms recur.  Prior: Case was discussed in CNS tumor board this morning.  Discussed and recommended salvage radiosurgery to both lesions, with fractionation.  Given high risk of radiation necrosis, possibly already concurrent with present syndrome, recommended concurrent avastin, IV 7.5-10mg /kg q2-3 weeks.  This could be added to his existing immunotherapy scheduled with Dr. Wilhelmenia Harada.    We reviewed potential side effects, including hypertension, bleeding/clotting, wound healing impairment.   He is agreeable with proceeding with the above plan.    We will touch base with shortly following first infusion to begin tapering the decadron.    Follow Up Instructions:   We ask that [name] return to clinic in 2 months following next brain MRI, or sooner as needed.  I discussed the assessment and treatment plan with the patient.  The patient was provided an opportunity to ask questions and all were answered.  The patient agreed with the plan and demonstrated understanding of the instructions.    The patient was advised to call back or seek an in-person evaluation if the symptoms worsen or if the condition fails to improve as anticipated.    Erion Weightman K Ledell Codrington, MD   I provided 20 minutes of non face-to-face telephone visit time during this encounter, and > 50% was spent counseling as documented under my assessment & plan.

## 2023-11-06 ENCOUNTER — Ambulatory Visit (HOSPITAL_COMMUNITY)
Admission: RE | Admit: 2023-11-06 | Discharge: 2023-11-06 | Disposition: A | Discharge status: Home / Self Care | Source: Ambulatory Visit | Attending: Radiation Oncology | Admitting: Radiation Oncology

## 2023-11-06 ENCOUNTER — Ambulatory Visit (HOSPITAL_COMMUNITY): Admission: RE | Admit: 2023-11-06 | Source: Ambulatory Visit

## 2023-11-06 ENCOUNTER — Other Ambulatory Visit

## 2023-11-06 DIAGNOSIS — C7931 Secondary malignant neoplasm of brain: Secondary | ICD-10-CM | POA: Insufficient documentation

## 2023-11-06 DIAGNOSIS — G936 Cerebral edema: Secondary | ICD-10-CM | POA: Diagnosis not present

## 2023-11-06 DIAGNOSIS — C3491 Malignant neoplasm of unspecified part of right bronchus or lung: Secondary | ICD-10-CM | POA: Diagnosis not present

## 2023-11-06 MED ORDER — GADOBUTROL 1 MMOL/ML IV SOLN
10.0000 mL | Freq: Once | INTRAVENOUS | Status: AC | PRN
  Administered 2023-11-06: 10 mL via INTRAVENOUS

## 2023-11-10 ENCOUNTER — Ambulatory Visit
Admission: RE | Admit: 2023-11-10 | Discharge: 2023-11-10 | Disposition: A | Discharge status: Home / Self Care | Source: Ambulatory Visit | Attending: Radiation Oncology | Admitting: Radiation Oncology

## 2023-11-10 ENCOUNTER — Ambulatory Visit
Admission: RE | Admit: 2023-11-10 | Discharge: 2023-11-10 | Disposition: A | Discharge status: Home / Self Care | Source: Ambulatory Visit | Attending: Radiation Oncology

## 2023-11-10 VITALS — BP 158/86 | HR 79 | Temp 97.5°F | Resp 16 | Wt 215.0 lb

## 2023-11-10 DIAGNOSIS — C7931 Secondary malignant neoplasm of brain: Secondary | ICD-10-CM | POA: Insufficient documentation

## 2023-11-10 DIAGNOSIS — Z87891 Personal history of nicotine dependence: Secondary | ICD-10-CM | POA: Diagnosis not present

## 2023-11-10 DIAGNOSIS — Z510 Encounter for antineoplastic radiation therapy: Secondary | ICD-10-CM | POA: Insufficient documentation

## 2023-11-10 DIAGNOSIS — Z452 Encounter for adjustment and management of vascular access device: Secondary | ICD-10-CM | POA: Insufficient documentation

## 2023-11-10 DIAGNOSIS — Z95828 Presence of other vascular implants and grafts: Secondary | ICD-10-CM

## 2023-11-10 DIAGNOSIS — C3411 Malignant neoplasm of upper lobe, right bronchus or lung: Secondary | ICD-10-CM | POA: Insufficient documentation

## 2023-11-10 MED ORDER — HEPARIN SOD (PORK) LOCK FLUSH 100 UNIT/ML IV SOLN
500.0000 [IU] | Freq: Once | INTRAVENOUS | Status: AC
  Administered 2023-11-10: 500 [IU] via INTRAVENOUS

## 2023-11-10 MED ORDER — SODIUM CHLORIDE 0.9% FLUSH
10.0000 mL | Freq: Once | INTRAVENOUS | Status: AC
  Administered 2023-11-10: 10 mL via INTRAVENOUS

## 2023-11-13 DIAGNOSIS — L03031 Cellulitis of right toe: Secondary | ICD-10-CM | POA: Diagnosis not present

## 2023-11-16 DIAGNOSIS — Z87891 Personal history of nicotine dependence: Secondary | ICD-10-CM | POA: Diagnosis not present

## 2023-11-16 DIAGNOSIS — C3411 Malignant neoplasm of upper lobe, right bronchus or lung: Secondary | ICD-10-CM | POA: Diagnosis not present

## 2023-11-16 DIAGNOSIS — C7931 Secondary malignant neoplasm of brain: Secondary | ICD-10-CM | POA: Diagnosis not present

## 2023-11-16 DIAGNOSIS — Z510 Encounter for antineoplastic radiation therapy: Secondary | ICD-10-CM | POA: Diagnosis not present

## 2023-11-17 ENCOUNTER — Other Ambulatory Visit: Payer: Self-pay

## 2023-11-17 ENCOUNTER — Ambulatory Visit
Admission: RE | Admit: 2023-11-17 | Discharge: 2023-11-17 | Disposition: A | Discharge status: Home / Self Care | Source: Ambulatory Visit | Attending: Radiation Oncology | Admitting: Radiation Oncology

## 2023-11-17 DIAGNOSIS — C7931 Secondary malignant neoplasm of brain: Secondary | ICD-10-CM | POA: Diagnosis not present

## 2023-11-17 DIAGNOSIS — Z510 Encounter for antineoplastic radiation therapy: Secondary | ICD-10-CM | POA: Diagnosis not present

## 2023-11-17 LAB — RAD ONC ARIA SESSION SUMMARY
Course Elapsed Days: 0
Plan Fractions Treated to Date: 1
Plan Prescribed Dose Per Fraction: 6 Gy
Plan Total Fractions Prescribed: 5
Plan Total Prescribed Dose: 30 Gy
Reference Point Dosage Given to Date: 6 Gy
Reference Point Session Dosage Given: 6 Gy
Session Number: 1

## 2023-11-17 NOTE — Op Note (Signed)
  Name: Joshua [name]  MRN: [id number]  Date: 11/17/2023   DOB: [birth year]  Stereotactic Radiosurgery Operative Note  PRE-OPERATIVE DIAGNOSIS:  Multiple Brain Metastases  POST-OPERATIVE DIAGNOSIS:  Multiple Brain Metastases  PROCEDURE:  Stereotactic Radiosurgery  SURGEON:  Baruch Bosch, MD  NARRATIVE: The patient underwent a radiation treatment planning session in the radiation oncology simulation suite under the care of the radiation oncology physician and physicist.  I participated closely in the radiation treatment planning afterwards. The patient underwent planning CT which was fused to 3T high resolution MRI with 1 mm axial slices.  These images were fused on the planning system.  We contoured the gross target volumes and subsequently expanded this to yield the Planning Target Volume. I actively participated in the planning process.  I helped to define and review the target contours and also the contours of the optic pathway, eyes, brainstem and selected nearby organs at risk.  All the dose constraints for critical structures were reviewed and compared to AAPM Task Group 101.  The prescription dose conformity was reviewed.  I approved the plan electronically.    Accordingly, [name] was brought to the TrueBeam stereotactic radiation treatment linac and placed in the custom immobilization mask.  The patient was aligned according to the IR fiducial markers with BrainLab Exactrac, then orthogonal x-rays were used in ExacTrac with the 6DOF robotic table and the shifts were made to align the patient  [name] received stereotactic radiosurgery uneventfully.    Lesions treated:  2   Complex lesions treated:  0 (>3.5 cm, <20mm of optic path, or within the brainstem)   The detailed description of the procedure is recorded in the radiation oncology procedure note.  I was present for the duration of the procedure.  DISPOSITION:  Following delivery, the patient was  transported to nursing in stable condition and monitored for possible acute effects to be discharged to home in stable condition with follow-up in one month.  Baruch Bosch, MD 11/17/2023 2:39 PM

## 2023-11-17 NOTE — Progress Notes (Signed)
 Patient rested with us  for 15 minutes following his SRS treatment.  Patient denies headache, dizziness, nausea, diplopia or ringing in the ears. Denies fatigue. Patient without complaints. Understands to avoid strenuous activity for the next 24 hours and call [phone] with needs.   BP (!) 152/84 (BP Location: Left Arm, Patient Position: Sitting, Cuff Size: Large)   Pulse 72   Temp 97.8 F (36.6 C)   Resp 18   Ht 5\' 11"  (1.803 m)   SpO2 100%   BMI 29.99 kg/m

## 2023-11-18 ENCOUNTER — Inpatient Hospital Stay (HOSPITAL_BASED_OUTPATIENT_CLINIC_OR_DEPARTMENT_OTHER): Admitting: Oncology

## 2023-11-18 ENCOUNTER — Ambulatory Visit
Admission: RE | Admit: 2023-11-18 | Discharge: 2023-11-18 | Disposition: A | Discharge status: Home / Self Care | Source: Ambulatory Visit | Attending: Oncology | Admitting: Oncology

## 2023-11-18 ENCOUNTER — Encounter: Payer: Self-pay | Admitting: Oncology

## 2023-11-18 ENCOUNTER — Inpatient Hospital Stay

## 2023-11-18 VITALS — BP 143/83 | HR 78 | Temp 97.5°F | Resp 18 | Wt 216.5 lb

## 2023-11-18 DIAGNOSIS — R569 Unspecified convulsions: Secondary | ICD-10-CM | POA: Diagnosis not present

## 2023-11-18 DIAGNOSIS — Z5112 Encounter for antineoplastic immunotherapy: Secondary | ICD-10-CM

## 2023-11-18 DIAGNOSIS — M7989 Other specified soft tissue disorders: Secondary | ICD-10-CM | POA: Diagnosis not present

## 2023-11-18 DIAGNOSIS — C3491 Malignant neoplasm of unspecified part of right bronchus or lung: Secondary | ICD-10-CM

## 2023-11-18 DIAGNOSIS — C7931 Secondary malignant neoplasm of brain: Secondary | ICD-10-CM

## 2023-11-18 DIAGNOSIS — E032 Hypothyroidism due to medicaments and other exogenous substances: Secondary | ICD-10-CM

## 2023-11-18 LAB — COMPREHENSIVE METABOLIC PANEL WITH GFR
ALT: 99 U/L — ABNORMAL HIGH (ref 0–44)
AST: 42 U/L — ABNORMAL HIGH (ref 15–41)
Albumin: 3.2 g/dL — ABNORMAL LOW (ref 3.5–5.0)
Alkaline Phosphatase: 58 U/L (ref 38–126)
Anion gap: 9 (ref 5–15)
BUN: 23 mg/dL (ref 8–23)
CO2: 25 mmol/L (ref 22–32)
Calcium: 8.6 mg/dL — ABNORMAL LOW (ref 8.9–10.3)
Chloride: 103 mmol/L (ref 98–111)
Creatinine, Ser: 0.88 mg/dL (ref 0.61–1.24)
GFR, Estimated: >60 mL/min (ref >60)
Glucose, Bld: 105 mg/dL — ABNORMAL HIGH (ref 70–99)
Potassium: 4.3 mmol/L (ref 3.5–5.1)
Sodium: 137 mmol/L (ref 135–145)
Total Bilirubin: 0.8 mg/dL (ref 0.0–1.2)
Total Protein: 6.3 g/dL — ABNORMAL LOW (ref 6.5–8.1)

## 2023-11-18 LAB — CBC WITH DIFFERENTIAL/PLATELET
Abs Immature Granulocytes: 0.07 10*3/uL (ref 0.00–0.07)
Basophils Absolute: 0 10*3/uL (ref 0.0–0.1)
Basophils Relative: 0 %
Eosinophils Absolute: 0 10*3/uL (ref 0.0–0.5)
Eosinophils Relative: 0 %
HCT: 37.9 % — ABNORMAL LOW (ref 39.0–52.0)
Hemoglobin: 12.5 g/dL — ABNORMAL LOW (ref 13.0–17.0)
Immature Granulocytes: 1 %
Lymphocytes Relative: 10 %
Lymphs Abs: 0.7 10*3/uL (ref 0.7–4.0)
MCH: 28.5 pg (ref 26.0–34.0)
MCHC: 33 g/dL (ref 30.0–36.0)
MCV: 86.3 fL (ref 80.0–100.0)
Monocytes Absolute: 0.6 10*3/uL (ref 0.1–1.0)
Monocytes Relative: 9 %
Neutro Abs: 5.8 10*3/uL (ref 1.7–7.7)
Neutrophils Relative %: 80 %
Platelets: 118 10*3/uL — ABNORMAL LOW (ref 150–400)
RBC: 4.39 MIL/uL (ref 4.22–5.81)
RDW: 14.7 % (ref 11.5–15.5)
WBC: 7.3 10*3/uL (ref 4.0–10.5)
nRBC: 0 % (ref 0.0–0.2)

## 2023-11-18 LAB — PROTEIN, URINE, RANDOM: Total Protein, Urine: <6 mg/dL

## 2023-11-18 MED ORDER — SODIUM CHLORIDE 0.9 % IV SOLN
10.0000 mg/kg | Freq: Once | INTRAVENOUS | Status: AC
  Administered 2023-11-18: 1000 mg via INTRAVENOUS
  Filled 2023-11-18: qty 8

## 2023-11-18 MED ORDER — SODIUM CHLORIDE 0.9 % IV SOLN
Freq: Once | INTRAVENOUS | Status: AC
Start: 2023-11-18 — End: 2023-11-18
  Filled 2023-11-18: qty 250

## 2023-11-18 NOTE — Assessment & Plan Note (Signed)
Durvalumab treatment as planned.  

## 2023-11-18 NOTE — Assessment & Plan Note (Addendum)
 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 NeuroOnc recommend Radiation and Bevacizumab   He is currently on tapering courses of dexamethasone , managed by Dr. Mark Sil.   Proceed with Bevacizumab  10mg /kg Q2 weeks

## 2023-11-18 NOTE — Assessment & Plan Note (Signed)
 continue levothyroxine 125 mcg daily

## 2023-11-18 NOTE — Assessment & Plan Note (Signed)
 Cellulitis versus DVT/stent occlusion. He has persistent symptoms despite finishing course of antibiotics. Right lower extremity extremity venous ultrasound negative for acute DVT. There is concern of possible bypass stent occlusion. I have communicated the results with patient over the phone.  He will call vascular surgeon tomorrow morning for further evaluation.  I will also send message to vascular surgeon.

## 2023-11-18 NOTE — Progress Notes (Signed)
 Hematology/Oncology Progress note [phone] [phone]    CHIEF COMPLAINTS/REASON FOR VISIT:  Follow up for lung cancer  ASSESSMENT & PLAN:   Cancer Staging  Squamous cell carcinoma of right lung (HCC) Staging form: Lung, AJCC 8th Edition - Clinical stage from 09/20/2020: Stage IV (cT1b, cN2, cM1) - Signed by [name], MD on 12/13/2021   Squamous cell carcinoma of right lung (HCC) Durvalumab  was previously held due to pneumonitis.  Dec CT showed stable disease discussed with patient.  Labs are reviewed and discussed with patient. Hold off Durvalumab .  Due to abnormal LFT  Encounter for antineoplastic immunotherapy Durvalumab  treatment as planned.   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 NeuroOnc recommend Radiation and Bevacizumab   He is currently on tapering courses of dexamethasone , managed by Dr. Mark Sil.   Proceed with Bevacizumab  10mg /kg Q2 weeks  Seizure (HCC) Follow up with Dr. Mark Sil.  Continue keppra  and course of  dexamethasone  as advised by Dr. Mark Sil.   Recommend patient to avoid driving until cleared by neurology.  Swelling of right lower extremity Cellulitis versus DVT/stent occlusion. He has persistent symptoms despite finishing course of antibiotics. Right lower extremity extremity venous ultrasound negative for acute DVT. There is concern of possible bypass stent occlusion. I have communicated the results with patient over the phone.  He will call vascular surgeon tomorrow morning for further evaluation.  I will also send message to vascular surgeon.    Follow up 2 weeks lab MD Durvalumab + Bevacizumab  All questions were answered. The patient knows to call the clinic with any problems, questions or concerns.  [name], MD, PhD [facility]  Hematology Oncology 11/18/2023       HISTORY OF PRESENTING ILLNESS:  Joshua [name] is a 68 y.o. male who has above history reviewed by me today presents for follow up visit for Stage IV lung squamous cell carcinoma. Oncology History  Squamous cell carcinoma of right lung (HCC)  09/18/2020 Imaging   PET scan showed right upper lobe nodule maximal SUV 11.5.  Hypermetabolic cluster right paratracheal adenopathy with maximum SUV up to 12.  No findings of metastatic disease to the neck/abdomen/pelvis or skeleton.   09/20/2020 Initial Diagnosis   Squamous cell carcinoma of right lung (HCC) NGS showed PD-L1 TPS 95%, LRP1B S953fs, PIK3CA E545K, RB1 c1422-2A>T, RIT1 T83_ A84del, STK11 P237fs, TPS53 R248W, TMB 19.70mut/mb, MS stable.    09/20/2020 Cancer Staging   Staging form: Lung, AJCC 8th Edition - Clinical stage from 09/20/2020: Stage IV (cT1b, cN2, cM1) - Signed by [name], MD on 12/13/2021   10/03/2020 Imaging   MRI brain with and without contrast showed no evidence of intracranial metastatic disease. Well-defined round lesion within the left parotid tail measuring approximately 2.3 x 1.8 x 1.8 cm   10/09/2020 - 11/27/2020 Radiation Therapy   concurrent chemoradiation   10/10/2020 - 10/31/2020 Chemotherapy   10/10/2020 concurrent chemoradiation.  carboplatin  AUC of 2 and Taxol  45 mg/m2 x 4.  Additional chemotherapy was held due to thrombocytopenia, neutropenia       12/31/2020 - 03/21/2022 Chemotherapy   Maintenance  Durvalumab  q14d      12/31/2020 -  Chemotherapy   Patient is on Treatment Plan : LUNG Durvalumab  (10) q14d     03/28/2021 Imaging   CT without contrast showed new linear right perihilar consolidation with  associated traction bronchiectasis.  Likely postradiation changes.  Additional new peribronchial vascular/nodular groundglass opacities with areas of consolidation or seen primarily in the right upper and lower lobes, more distant from expected radiation fields. 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  Per my discussion with pulmonology Dr. 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     05/09/2021 Imaging   Repeat CT chest without contrast showed evolving radiation changes in the right perihilar region likely accounting for interval increase to thickening of the posterior wall of the right upper lobe bronchus.  The more peripheral right lung groundglass opacities seen on most recent study have generally improved with exception of one peripherally right upper lobe which appears new.  These are likely inflammatory.  No findings highly suspicious for local recurrence.   05/30/2021 - 01/10/2022 Chemotherapy   Durvalumab  q14d     10/07/2021 Imaging   MRI brain with and without contrast showed 2 contrast-enhancing lesions, most consistent with metastatic disease.  Largest lesion is in the left frontal lobe and measures up to 1.3 cm with a large amount of surrounding edema and 9 mm rightward midline shift.  The other enhancing lesion was 4 mm in the right occipital lobe with a small amount of surrounding edema.  No acute or chronic hemorrhage.   10/07/2021 Progression   Headache after colonoscopy, progressive difficulty using her right arm.  Progressively worsening of speech. Stage IV lung cancer with brain metastasis.  started on Dexamethasone .  He was seen by neurosurgery and radiation oncology.  Recommendation is to proceed with SRS.  Patient was discharged with dexamethasone    10/16/2021 Imaging   PET scan showed postradiation changes in the right supra hilar lung without evidence of local recurrence.  No evidence of lung cancer recurrence or metastatic disease.  Single focus of intense metabolic activity associated with posterior left 11th rib likely secondary to recent fall/posttraumatic metabolic changes.  Frontal  uptake in the left frontal  lobe corresponding to the known metastatic lesion in the brain   10/29/2021 -  Radiation Therapy   Brain Radiation [facility]   12/10/2021 Imaging   Brain MRI: Continued interval decrease in size of a left frontal lobe metastasis, now measuring 10 mm. Moderate surrounding edema has also decreased.  3-4 mm metastasis within the right occipital lobe, not significantly changed in size. There is now a small focus of central non enhancement within this lesion. Increased edema. 3 mm enhancing metastasis within the high left parietal lobe. In retrospect, this was likely present as a subtle punctate focus of enhancement on the prior brain MRI of 10/22/2021.   01/18/2022 Imaging   CT chest abdomen pelvis w contrast 1. Stable post treatment changes in the right upper lobe medially with dense radiation fibrosis but no findings to suggest local recurrent tumor. 2. Significant progression of ill-defined ground-glass opacity, interstitial thickening and airspace nodularity in the right upper lobe and upper aspect of the right lower lobe. Findings could reflect an inflammatory or atypical infectious process, drug induced pneumonitis or interstitial spread of tumor.3. No mediastinal or hilar mass or adenopathy. 4. No findings for abdominal/pelvic metastatic disease or osseous metastatic disease.   02/19/2022 Imaging   MRI brain w wo contrast showed Three metastatic deposits in the brain appear smaller. Decreased edema in the left parietal lobe. No new lesions    04/04/2022 Imaging   CT chest w contrast 1.  Stable radiation changes in the right perihilar region without evidence of local recurrence. 2. Interval improvement in the patchy ground-glass opacities previously demonstrated in both lungs, most consistent with a resolving infectious or inflammatory process (including immunotherapy-related pneumonitis). 3. No evidence of metastatic disease.   05/14/2022 Miscellaneous   He had a mechanical fall and fractured his  left elbow. xrays showed a left elbow fracture  05/26/22 seen by orthopedic surgeon, cast applied.    05/27/2022 Imaging   MRI brain w wo contrast 1. All three known brain metastases have enlarged since August, with increasing regional edema. And there is a small new left anterior cerebellar 4-5 mm metastasis (was questionably punctate on the May MRI). This constellation could reflect a combination of True Progression (Left Cerebellar lesion) and pseudoprogression/radionecrosis (3 existing mets).    2. No significant intracranial mass effect. 3. Underlying moderate cerebral white matter signal changes.   05/30/2022 Imaging   CT chest abdomen pelvis w contrast  1. 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 There is a stable small right pleural effusion, but no definitive findings to suggest residual or recurrent disease on today's examination. 2. No signs of metastatic disease in the abdomen or pelvis.  3. Aortic atherosclerosis, in addition to two-vessel coronary artery disease. Please note that although the presence of coronary artery calcium  documents the presence of coronary artery disease, the severity of this disease and any potential stenosis cannot be assessed on this non-gated CT examination. Assessment for potential risk factor modification, dietary therapy or pharmacologic therapy may be warranted, if clinically indicated. 4. Hepatic steatosis. 5. Colonic diverticulosis without evidence of acute diverticulitis at this time. 6. Incompletely imaged but apparently occluded vascular graft in the superior aspect of the right anterior thigh. Referral to vascular specialist for further clinical evaluation should be considered if clinically appropriate. 7. Additional incidental findings, as above.   07/22/2022 Imaging   MRI brain  1. No new or enlarging lesions. No  intracranial disease progression. 2. Decreased size of treated lesions of the left frontoparietal junction and left postcentral gyrus. 3. Unchanged lesions in the left cerebellum and right occipital lobe.   10/15/2022 Imaging   MRI brain w wo contrast showed Interval increase in size of multiple intraparenchymal lesions with worsened surrounding vasogenic edema. 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 No new lesions   11/05/2022 Imaging   MRI brain w wo contrast  1. Likely slight increase and right occipital and left cerebellar lesions. Otherwise, unchanged metastases, as above. No new lesions identified. 2. Partially imaged large probable lymph node in the left upper neck, apparent on prior PET CT.   11/13/2022 -  Radiation Therapy   Each of the sites were treated with SRS/SBRT/SRT-IMRT technique to 20 Gy in 1 fraction to each of the following sites: PTV_2_LCerebel_26mm PTV_3_RCerebel_60mm PTV_4_ROccip_71mm PTV_5_LParietal_24mm   11/27/2022 Imaging   CT chest abdomen pelvis w contrast showed  Improving bilateral scattered interstitial and ground-glass opacities. Small amount of residual areas in the extreme left lower lobe inferiorly. This specific area is slightly increased from previous but overall the amount of opacities is significantly improved. Recommend continued follow-up. No developing new mass lesion, fluid collection or lymph node enlargement. Stable posttreatment changes right perihilar in the lung. Fatty liver infiltration. Colonic diverticulosis. Improved tiny right pleural effusion   02/05/2023 Imaging   MRI brain with and without contrast Interval  decrease in all of the metastatic lesions, detailed above.No new lesions identified.    Imaging   CT chest abdomen pelvis w contrast showed  1. Similar right perihilar and central upper lobe radiation fibrosis, without recurrent or metastatic  disease. 2. Trace right pleural fluid or thickening, new or minimally increased. 3. Incidental findings, including: Aortic atherosclerosis (ICD10-I70.0), coronary artery atherosclerosis and emphysema (ICD10-J43.9). Hepatic steatosis.   06/25/2023 Imaging   CT chest abdomen pelvis w contrast showed 1. Unchanged post treatment/post radiation appearance of the right lung, with perihilar and suprahilar paramedian fibrosis and consolidation. 2. No evidence of lymphadenopathy or metastatic disease in the chest, abdomen, or pelvis. 3. Hepatic steatosis. 4. Coronary artery disease.   Aortic Atherosclerosis (ICD10-I70.0) and Emphysema (ICD10-J43.9).   08/26/2023 [facility] Admission   hospitalization due to was observed speech and jerking movements on right side despite being on dexamethasone . Continuous EEG suggest cortical dysfunction arising from left temporal region.  No seizure or elective form discharges were seen. Patient was discharged on dexamethasone  4 mg, Keppra , lamotrigine .    11/04/2023 -  Chemotherapy   Add Bevacizumab  5mg /kg to Durvalumab .       chronic SOB,not worse.  Patient denies any additional seizure episodes.  No additional episodes.  Patient is on brain radiation.  He tolerates well.  Currently on tapering course of dexamethasone . Patient recently was seen at urgent care on 11/13/2023 after stopped right great toe wire working in the yard barefoot. He noticed the toe was discolored.  No drainage.  Also noticed swelling of right lower extremity in the foot. Patient was prescribed with a course of doxycycline which he almost finishes the course. He continues to have the right lower extremity swelling.  He denies any pain.  Review of Systems  Constitutional:  Positive for fatigue. Negative for appetite change, chills, fever and unexpected weight change.  HENT:   Negative for hearing loss and voice change.   Eyes:  Negative for eye problems and icterus.  Respiratory:   Negative for chest tightness, cough and shortness of breath.   Cardiovascular:  Positive for leg swelling. Negative for chest pain.  Gastrointestinal:  Negative for abdominal distention and abdominal pain.  Endocrine: Negative for hot flashes.  Genitourinary:  Negative for difficulty urinating, dysuria and frequency.   Musculoskeletal:  Positive for gait problem. Negative for arthralgias.  Skin:  Negative for itching and rash.  Neurological:  Positive for gait problem. Negative for extremity weakness, headaches, numbness and speech difficulty.  Hematological:  Negative for adenopathy. Does not bruise/bleed easily.  Psychiatric/Behavioral:  Negative for confusion.       MEDICAL HISTORY:  Past Medical History:  Diagnosis Date   Allergy    Cancer (HCC)    lung   COPD (chronic obstructive pulmonary disease) (HCC)    Dyspnea    former smoker. 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   Malignant neoplasm metastatic to brain [facility])    Peripheral vascular disease (HCC)    Pre-diabetes    Sleep apnea     SURGICAL HISTORY: Past Surgical History:  Procedure Laterality Date   ANTERIOR CERVICAL DECOMP/DISCECTOMY FUSION N/A 03/14/2020   Procedure: ANTERIOR CERVICAL DECOMPRESSION/DISCECTOMY FUSION 3 LEVELS C4-7;  Surgeon: Jodeen Munch, MD;  Location: [facility] ORS;  Service: Neurosurgery;  Laterality: N/A;   BACK SURGERY  2011   CENTRAL LINE INSERTION Right 09/10/2016   Procedure: CENTRAL LINE INSERTION;  Surgeon: Devon Fogo  G, MD;  Location: [facility] ORS;  Service: Vascular;  Laterality: Right;   CERVICAL FUSION     C4-c7 on Mar 14, 2020   COLONOSCOPY  2013 ?   COLONOSCOPY N/A 09/09/2021   Procedure: COLONOSCOPY;  Surgeon: Quintin Buckle, DO;  Location: [facility] ENDOSCOPY;  Service: Gastroenterology;  Laterality: N/A;   CORONARY ANGIOPLASTY     ESOPHAGOGASTRODUODENOSCOPY N/A 09/09/2021   Procedure: ESOPHAGOGASTRODUODENOSCOPY  (EGD);  Surgeon: Quintin Buckle, DO;  Location: [facility] ENDOSCOPY;  Service: Gastroenterology;  Laterality: N/A;   FEMORAL-POPLITEAL BYPASS GRAFT Right 09/10/2016   Procedure: BYPASS GRAFT FEMORAL-POPLITEAL ARTERY ( BELOW KNEE );  Surgeon: Jackquelyn Mass, MD;  Location: [facility] ORS;  Service: Vascular;  Laterality: Right;   FEMORAL-TIBIAL BYPASS GRAFT Right 03/23/2020   Procedure: BYPASS GRAFT RIGHT FEMORAL- DISTAL TIBIAL ARTERY WITH DISTAL FLOW GRAFT;  Surgeon: Jackquelyn Mass, MD;  Location: [facility] ORS;  Service: Vascular;  Laterality: Right;   LEFT HEART CATH AND CORONARY ANGIOGRAPHY Left 05/16/2021   Procedure: LEFT HEART CATH AND CORONARY ANGIOGRAPHY;  Surgeon: Link Rice, MD;  Location: [facility] INVASIVE CV LAB;  Service: Cardiovascular;  Laterality: Left;   LOWER EXTREMITY ANGIOGRAPHY Right 11/18/2016   Procedure: Lower Extremity Angiography;  Surgeon: Jackquelyn Mass, MD;  Location: [facility] INVASIVE CV LAB;  Service: Cardiovascular;  Laterality: Right;   LOWER EXTREMITY ANGIOGRAPHY Right 12/09/2016   Procedure: Lower Extremity Angiography;  Surgeon: Jackquelyn Mass, MD;  Location: [facility] INVASIVE CV LAB;  Service: Cardiovascular;  Laterality: Right;   LOWER EXTREMITY ANGIOGRAPHY Right 11/15/2019   Procedure: LOWER EXTREMITY ANGIOGRAPHY;  Surgeon: Jackquelyn Mass, MD;  Location: [facility] INVASIVE CV LAB;  Service: Cardiovascular;  Laterality: Right;   LOWER EXTREMITY ANGIOGRAPHY Right 12/01/2019   Procedure: Lower Extremity Angiography;  Surgeon: Celso College, MD;  Location: [facility] INVASIVE CV LAB;  Service: Cardiovascular;  Laterality: Right;   LOWER EXTREMITY ANGIOGRAPHY Right 12/02/2019   Procedure: LOWER EXTREMITY ANGIOGRAPHY;  Surgeon: Jackquelyn Mass, MD;  Location: [facility] INVASIVE CV LAB;  Service: Cardiovascular;  Laterality: Right;   LOWER EXTREMITY ANGIOGRAPHY Right 03/23/2020   Procedure: Lower Extremity Angiography;  Surgeon: Jackquelyn Mass, MD;  Location: [facility] INVASIVE CV LAB;   Service: Cardiovascular;  Laterality: Right;   LOWER EXTREMITY ANGIOGRAPHY Right 09/25/2021   Procedure: Lower Extremity Angiography;  Surgeon: Jackquelyn Mass, MD;  Location: [facility] INVASIVE CV LAB;  Service: Cardiovascular;  Laterality: Right;   LOWER EXTREMITY ANGIOGRAPHY Right 09/26/2021   Procedure: Lower Extremity Angiography;  Surgeon: Celso College, MD;  Location: [facility] INVASIVE CV LAB;  Service: Cardiovascular;  Laterality: Right;   LOWER EXTREMITY ANGIOGRAPHY Right 04/08/2022   Procedure: Lower Extremity Angiography;  Surgeon: Jackquelyn Mass, MD;  Location: [facility] INVASIVE CV LAB;  Service: Cardiovascular;  Laterality: Right;   LOWER EXTREMITY INTERVENTION  11/18/2016   Procedure: Lower Extremity Intervention;  Surgeon: Jackquelyn Mass, MD;  Location: [facility] INVASIVE CV LAB;  Service: Cardiovascular;;   PERIPHERAL VASCULAR CATHETERIZATION Right 01/02/2015   Procedure: Lower Extremity Angiography;  Surgeon: Jackquelyn Mass, MD;  Location: [facility] INVASIVE CV LAB;  Service: Cardiovascular;  Laterality: Right;   PERIPHERAL VASCULAR CATHETERIZATION Right 06/18/2015   Procedure: Lower Extremity Angiography;  Surgeon: Celso College, MD;  Location: [facility] INVASIVE CV LAB;  Service: Cardiovascular;  Laterality: Right;   PERIPHERAL VASCULAR CATHETERIZATION  06/18/2015   Procedure: Lower Extremity Intervention;  Surgeon: Celso College, MD;  Location: [facility] INVASIVE CV LAB;  Service: Cardiovascular;;   PERIPHERAL VASCULAR CATHETERIZATION  N/A 10/09/2015   Procedure: Abdominal Aortogram w/Lower Extremity;  Surgeon: Jackquelyn Mass, MD;  Location: [facility] INVASIVE CV LAB;  Service: Cardiovascular;  Laterality: N/A;   PERIPHERAL VASCULAR CATHETERIZATION  10/09/2015   Procedure: Lower Extremity Intervention;  Surgeon: Jackquelyn Mass, MD;  Location: [facility] INVASIVE CV LAB;  Service: Cardiovascular;;   PERIPHERAL VASCULAR CATHETERIZATION Right 10/10/2015   Procedure: Lower Extremity Angiography;  Surgeon: Celso College,  MD;  Location: [facility] INVASIVE CV LAB;  Service: Cardiovascular;  Laterality: Right;   PERIPHERAL VASCULAR CATHETERIZATION Right 10/10/2015   Procedure: Lower Extremity Intervention;  Surgeon: Celso College, MD;  Location: [facility] INVASIVE CV LAB;  Service: Cardiovascular;  Laterality: Right;   PERIPHERAL VASCULAR CATHETERIZATION Right 12/03/2015   Procedure: Lower Extremity Angiography;  Surgeon: Celso College, MD;  Location: [facility] INVASIVE CV LAB;  Service: Cardiovascular;  Laterality: Right;   PERIPHERAL VASCULAR CATHETERIZATION Right 12/04/2015   Procedure: Lower Extremity Angiography;  Surgeon: Celso College, MD;  Location: [facility] INVASIVE CV LAB;  Service: Cardiovascular;  Laterality: Right;   PERIPHERAL VASCULAR CATHETERIZATION  12/04/2015   Procedure: Lower Extremity Intervention;  Surgeon: Celso College, MD;  Location: [facility] INVASIVE CV LAB;  Service: Cardiovascular;;   PERIPHERAL VASCULAR CATHETERIZATION Right 06/30/2016   Procedure: Lower Extremity Angiography;  Surgeon: Celso College, MD;  Location: [facility] INVASIVE CV LAB;  Service: Cardiovascular;  Laterality: Right;   PERIPHERAL VASCULAR CATHETERIZATION Right 06/25/2016   Procedure: Lower Extremity Angiography;  Surgeon: Jackquelyn Mass, MD;  Location: [facility] INVASIVE CV LAB;  Service: Cardiovascular;  Laterality: Right;   PERIPHERAL VASCULAR CATHETERIZATION Right 07/01/2016   Procedure: Lower Extremity Angiography;  Surgeon: Jackquelyn Mass, MD;  Location: [facility] INVASIVE CV LAB;  Service: Cardiovascular;  Laterality: Right;   PERIPHERAL VASCULAR CATHETERIZATION  07/01/2016   Procedure: Lower Extremity Intervention;  Surgeon: Jackquelyn Mass, MD;  Location: [facility] INVASIVE CV LAB;  Service: Cardiovascular;;   PERIPHERAL VASCULAR CATHETERIZATION Right 08/01/2016   Procedure: Lower Extremity Angiography;  Surgeon: Jackquelyn Mass, MD;  Location: [facility] INVASIVE CV LAB;  Service: Cardiovascular;  Laterality: Right;   PORTA CATH INSERTION N/A 10/05/2020    Procedure: PORTA CATH INSERTION;  Surgeon: Jackquelyn Mass, MD;  Location: [facility] INVASIVE CV LAB;  Service: Cardiovascular;  Laterality: N/A;   stent placement in right leg Right    VIDEO BRONCHOSCOPY N/A 09/14/2020   Procedure: VIDEO BRONCHOSCOPY WITH FLUORO;  Surgeon: Marc Senior, MD;  Location: [facility] ORS;  Service: Cardiopulmonary;  Laterality: N/A;   VIDEO BRONCHOSCOPY WITH ENDOBRONCHIAL ULTRASOUND N/A 09/14/2020   Procedure: VIDEO BRONCHOSCOPY WITH ENDOBRONCHIAL ULTRASOUND;  Surgeon: Marc Senior, MD;  Location: [facility] ORS;  Service: Cardiopulmonary;  Laterality: N/A;    SOCIAL HISTORY: Social History   Socioeconomic History   Marital status: Single    Spouse name: Not on file   Number of children: Not on file   Years of education: Not on file   Highest education level: Not on file  Occupational History   Occupation: unemployed  Tobacco Use   Smoking status: Former    Current packs/day: 0.00    Average packs/day: 2.0 packs/day for 38.0 years (76.0 ttl pk-yrs)    Types: Cigarettes    Start date: 12/19/1976    Quit date: 12/20/2014    Years since quitting: 8.9   Smokeless tobacco: Never   Tobacco comments:    quit smoking in 2017 most smoked 2   Vaping Use   Vaping status: Former  Substance and  Sexual Activity   Alcohol use: Not Currently   Drug use: No   Sexual activity: Yes  Other Topics Concern   Not on file  Social History Narrative   Not on file   Social Drivers of Health   Financial Resource Strain: Low Risk  (07/27/2023)   Received from [facility]   Overall Financial Resource Strain (CARDIA)    Difficulty of Paying Living Expenses: Not hard at all  Food Insecurity: No Food Insecurity (08/21/2023)   Hunger Vital Sign    Worried About Running Out of Food in the Last Year: Never true    Ran Out of Food in the Last Year: Never true  Transportation Needs: No Transportation Needs (08/21/2023)   PRAPARE - Scientist, research (physical sciences) (Medical): No    Lack of Transportation (Non-Medical): No  Physical Activity: Insufficiently Active (05/29/2023)   Exercise Vital Sign    Days of Exercise per Week: 2 days    Minutes of Exercise per Session: 10 min  Stress: No Stress Concern Present (05/29/2023)   [employer] of Occupational Health - Occupational Stress Questionnaire    Feeling of Stress : Not at all  Social Connections: Socially Isolated (08/21/2023)   Social Connection and Isolation Panel [NHANES]    Frequency of Communication with Friends and Family: More than three times a week    Frequency of Social Gatherings with Friends and Family: Three times a week    Attends Religious Services: Never    Active Member of Clubs or Organizations: No    Attends Banker Meetings: Never    Marital Status: Never married  Intimate Partner Violence: Not At Risk (08/21/2023)   Humiliation, Afraid, Rape, and Kick questionnaire    Fear of Current or Ex-Partner: No    Emotionally Abused: No    Physically Abused: No    Sexually Abused: No    FAMILY HISTORY: Family History  Problem Relation Age of Onset   Diabetes Mother    Hypertension Mother    Heart murmur Mother    Leukemia Mother    Throat cancer Maternal Grandmother    Colon cancer Neg Hx    Breast cancer Neg Hx     ALLERGIES:  has no active allergies.  MEDICATIONS:  Current Outpatient Medications  Medication Sig Dispense Refill   aspirin  EC 81 MG tablet Take 81 mg by mouth daily. Swallow whole.     clopidogrel  (PLAVIX ) 75 MG tablet TAKE 1 TABLET BY MOUTH DAILY 100 tablet 2   dexamethasone  (DECADRON ) 1 MG tablet Take 3 tablets (3 mg total) by mouth daily with breakfast for 7 days, THEN 2 tablets (2 mg total) daily with breakfast for 7 days, THEN 1 tablet (1 mg total) daily with breakfast for 7 days. 42 tablet 0   doxycycline (VIBRAMYCIN) 100 MG capsule Take 1 capsule by mouth 2 (two) times daily.     levETIRAcetam  (KEPPRA ) 1000 MG tablet  Take 1 tablet (1,000 mg total) by mouth 2 (two) times daily. 60 tablet 2   levothyroxine  (LEVOXYL ) 125 MCG tablet Take 1 tablet (125 mcg total) by mouth daily before breakfast. 90 tablet 0   rosuvastatin  (CRESTOR ) 40 MG tablet Take 1 tablet (40 mg total) by mouth daily. (Patient not taking: Reported on 11/18/2023) 90 tablet 3   No current facility-administered medications for this visit.   Facility-Administered Medications Ordered in Other Visits  Medication Dose Route Frequency Provider Last Rate Last Admin   albuterol  (  PROVENTIL ) (2.5 MG/3ML) 0.083% nebulizer solution 2.5 mg  2.5 mg Nebulization Once Marc Senior, MD       heparin  lock flush 100 unit/mL  500 Units Intracatheter Once PRN [name], MD         PHYSICAL EXAMINATION: ECOG PERFORMANCE STATUS: 1 - Symptomatic but completely ambulatory Vitals:   11/18/23 1305 11/18/23 1316  BP: (!) 162/82 (!) 143/83  Pulse: 78   Resp: 18   Temp: (!) 97.5 F (36.4 C)   SpO2: 96%     Filed Weights   11/18/23 1305  Weight: 216 lb 8 oz (98.2 kg)     Physical Exam Constitutional:      General: He is not in acute distress.    Appearance: He is obese.     Comments: Walks with a cane  HENT:     Head: Normocephalic and atraumatic.  Eyes:     General: No scleral icterus. Cardiovascular:     Rate and Rhythm: Normal rate and regular rhythm.  Pulmonary:     Effort: Pulmonary effort is normal. No respiratory distress.     Breath sounds: Normal breath sounds. No wheezing.  Abdominal:     General: There is no distension.     Palpations: Abdomen is soft.     Tenderness: There is no abdominal tenderness.  Musculoskeletal:     Cervical back: Normal range of motion and neck supple.     Right lower leg: Edema present.  Skin:    General: Skin is warm and dry.     Findings: No erythema.  Neurological:     Mental Status: He is alert and oriented to person, place, and time. Mental status is at baseline.     Cranial Nerves: No cranial  nerve deficit.     Motor: No abnormal muscle tone.  Psychiatric:        Mood and Affect: Mood and affect normal.      LABORATORY DATA:  I have reviewed the data as listed    Latest Ref Rng & Units 11/18/2023   12:49 PM 11/04/2023    8:50 AM 10/21/2023    1:05 PM  CBC  WBC 4.0 - 10.5 K/uL 7.3  11.4  10.4   Hemoglobin 13.0 - 17.0 g/dL 16.1  09.6  04.5   Hematocrit 39.0 - 52.0 % 37.9  40.9  38.4   Platelets 150 - 400 K/uL 118  172  200       Latest Ref Rng & Units 11/18/2023   12:49 PM 11/04/2023    8:50 AM 10/21/2023    1:05 PM  CMP  Glucose 70 - 99 mg/dL 409  811  914   BUN 8 - 23 mg/dL 23  25  29    Creatinine 0.61 - 1.24 mg/dL 7.82  9.56  2.13   Sodium 135 - 145 mmol/L 137  139  137   Potassium 3.5 - 5.1 mmol/L 4.3  4.1  3.5   Chloride 98 - 111 mmol/L 103  105  103   CO2 22 - 32 mmol/L 25  25  25    Calcium  8.9 - 10.3 mg/dL 8.6  8.6  8.9   Total Protein 6.5 - 8.1 g/dL 6.3  6.4  6.7   Total Bilirubin 0.0 - 1.2 mg/dL 0.8  0.9  0.7   Alkaline Phos 38 - 126 U/L 58  49  60   AST 15 - 41 U/L 42  21  30   ALT 0 - 44  U/L 99  58  41      RADIOGRAPHIC STUDIES: I have personally reviewed the radiological images as listed and agreed with the findings in the report. US  Venous Img Lower Unilateral Right (DVT) Result Date: 11/18/2023 CLINICAL DATA:  Right leg swelling EXAM: RIGHT LOWER EXTREMITY VENOUS DOPPLER ULTRASOUND TECHNIQUE: Gray-scale sonography with compression, as well as color and duplex ultrasound, were performed to evaluate the deep venous system(s) from the level of the common femoral vein through the popliteal and proximal calf veins. COMPARISON:  None Available. FINDINGS: VENOUS Normal compressibility of the common femoral, superficial femoral, and popliteal veins, as well as the visualized calf veins. Visualized portions of profunda femoral vein and great saphenous vein unremarkable. No filling defects to suggest DVT on grayscale or color Doppler imaging. Doppler waveforms show  normal direction of venous flow, normal respiratory plasticity and response to augmentation. Limited views of the contralateral common femoral vein are unremarkable. 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 Limitations: none IMPRESSION: 1. No findings of right lower extremity DVT. 2. 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 If workup of the right lower extremity arterial system is indicated, a CT angiogram runoff study could be performed. Electronically Signed   By: Freida Jes M.D.   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 Status post chemoradiation. Enhancing lesions noted on brain MRI in 2023. Status post SRS. EXAM: MRI HEAD WITHOUT AND WITH CONTRAST TECHNIQUE: Multiplanar, multiecho pulse sequences of the brain and surrounding structures were obtained without and with intravenous contrast. CONTRAST:  10mL GADAVIST  GADOBUTROL  1 MMOL/ML IV SOLN COMPARISON:  None Available. FINDINGS: Brain: 1.4 x 1.4 x 1.6 cm enhancing mass in the posterior aspect of the left frontal operculum previously measuring 1.8 x 1.6 x 2.2 cm when remeasured in a similar manner (series 1200 image 197, series 9 image 26). Additional centrally necrotic enhancing lesion in the left parietal lobe which measures 3.5 x 2.9 x 2.4 cm, previously 4.5 x 3.1 x 2.9 cm (series 1200, image 241). There is decreased enhancement along the inferior margin of the lesion. Centrally necrotic component appears slightly decreased in size but appears to occupy a greater portion of the lesion compared to prior. Similar enhancement along the superficial/superior margin of the lesion with similar extension to the dura. Redemonstration of extensive vasogenic edema  in the left frontoparietal lobes which is overall decreased compared to the prior study with decreased gyral expansion. There is likely slightly decreased involvement of the left basal ganglia and external capsule. Approximately 4 mm rightward midline shift, previously 7 mm. 0.8 cm peripherally enhancing lesion in the right occipital lobe, previously measuring 1.0 cm. Surrounding vasogenic edema is similar to prior. There is a new linear focus of enhancement over the convexity of the left inferior parietal lobule which may reflect a non suppressed vessel (series 1200 image 210). No nodular or masslike enhancement in this region. Otherwise no new enhancing lesions noted. No acute infarct. Similar chronic microhemorrhage in the mid/posterior left cingulate gyrus. Similar chronic blood products associated with the left cerebral lesions. The basilar cisterns are patent. No extra-axial fluid collection. Vascular: Normal flow voids. Skull and upper cervical spine: Normal marrow signal. Sinuses/Orbits: Focal mucosal thickening in the  right posterior ethmoid air cells. Other: None. IMPRESSION: Compared to 10/09/2023 there is decreased size of the left frontal operculum lesion and left parietal lesion. Decreased vasogenic edema in the left frontoparietal lobes with decreased gyral expansion. 4 mm rightward midline shift, previously 7 mm. Similar appearance of right occipital lesion. Focal curvilinear enhancement over the convexity of the inferior left parietal lobe which may reflect a non suppressed vessel. Recommend attention on follow-up. Electronically Signed   By: Denny Flack M.D.   On: 11/09/2023 20:28   NM PET Metabolic Brain Result Date: 10/20/2023 CLINICAL DATA:  Dementia. 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 Full-ring PET imaging was performed from the vertex to skull base. CT data was obtained and used for  attenuation correction and anatomic localization. Post processing fusion of PET data set with brain MRI 10/09/2023. FASTING BLOOD GLUCOSE:  Value: 88 mg/dl COMPARISON:  Brain MRI 10/09/2018 FINDINGS: Focus of hypermetabolic activity in the posterior lateral LEFT frontal lobe which is slightly greater than cortical metabolism (image 58). This corresponds to enhancing lesion on comparison MRI. On coronal orientation the most intense portion the lesion along the craniad surface of the lesion. Thin rim of peripheral hypermetabolic activity associated with the high LEFT parietal enhancing lesion (image 24 through 31 PET data set). This rim of metabolic activity is slightly greater than adjacent normal cortical metabolic activity. IMPRESSION: Findings concerning for tumor recurrence within the LEFT posterior lateral frontal lobe and high LEFT parietal lobe. Electronically Signed   By: Deboraha Fallow M.D.   On: 10/20/2023 12:30   CT CHEST ABDOMEN PELVIS W CONTRAST Result Date: 10/18/2023 CLINICAL DATA:  Lung cancer restaging * Tracking Code: BO * EXAM: CT CHEST, ABDOMEN, AND PELVIS WITH CONTRAST TECHNIQUE: Multidetector CT imaging of the chest, abdomen and pelvis was performed following the standard protocol during bolus administration of intravenous contrast. RADIATION DOSE REDUCTION: This exam was performed according to the departmental dose-optimization program which includes automated exposure control, adjustment of the mA and/or kV according to patient size and/or use of iterative reconstruction technique. CONTRAST:  OMNIPAQUE  IOHEXOL  300 MG/ML  SOLN COMPARISON:  06/25/2023 FINDINGS: CT CHEST FINDINGS Cardiovascular: Right chest port catheter. Scattered aortic atherosclerosis. Normal heart size. Scattered left and right coronary artery calcifications. No pericardial effusion. Mediastinum/Nodes: No enlarged mediastinal, hilar, or axillary lymph nodes. Thyroid  gland, trachea, and esophagus demonstrate no  significant findings. Lungs/Pleura: Unchanged post treatment/post radiation appearance of the right lung with perihilar and paramedian fibrosis and consolidation. Minimal paraseptal emphysema. No pleural effusion or pneumothorax. Musculoskeletal: No chest wall abnormality. No acute osseous findings. CT ABDOMEN PELVIS FINDINGS Hepatobiliary: No solid liver abnormality is seen. Hepatic steatosis. No gallstones, gallbladder wall thickening, or biliary dilatation. Pancreas: Unremarkable. No pancreatic ductal dilatation or surrounding inflammatory changes. Spleen: Normal in size without significant abnormality. Adrenals/Urinary Tract: Adrenal glands are unremarkable. Kidneys are normal, without renal calculi, solid lesion, or hydronephrosis. Bladder is unremarkable. Stomach/Bowel: Stomach is within normal limits. Appendix appears normal. No evidence of bowel wall thickening, distention, or inflammatory changes. Sigmoid diverticulosis. Vascular/Lymphatic: Aortic atherosclerosis. Partially imaged right superficial femoral and profunda system stenting. No enlarged abdominal or pelvic lymph nodes. Reproductive: No mass or other abnormality. Other: No abdominal wall hernia or abnormality. No ascites. Musculoskeletal: No acute osseous findings. IMPRESSION: 1. Unchanged post treatment/post radiation appearance of the right lung with perihilar and paramedian fibrosis and consolidation. 2. No evidence of lymphadenopathy or metastatic disease in the  chest, abdomen, or pelvis. 3. Hepatic steatosis. 4. Coronary artery disease. Aortic Atherosclerosis (ICD10-I70.0) and Emphysema (ICD10-J43.9). Electronically Signed   By: Fredricka Jenny M.D.   On: 10/18/2023 08:12   MR BRAIN W WO CONTRAST Result Date: 10/09/2023 CLINICAL DATA:  Brain/CNS neoplasm, assess treatment response. History of metastatic lung cancer. EXAM: MRI HEAD WITHOUT AND WITH CONTRAST TECHNIQUE: Multiplanar, multiecho pulse sequences of the brain and surrounding  structures were obtained without and with intravenous contrast. CONTRAST:  10mL GADAVIST  GADOBUTROL  1 MMOL/ML IV SOLN COMPARISON:  Head MRI 08/21/2023 FINDINGS: Brain: A heterogeneously enhancing, partially necrotic lesion in the posterior aspect of the left frontal operculum has an unchanged maximal oblique diameter of 2.2 cm on coronal images (series 16, image 18), however the lesion appears slightly larger on axial and sagittal images (for example, 1.8 cm AP diameter on axial series 15, image 108 compared to 1.5 cm previously). A partially necrotic lesion in the left parietal lobe demonstrates more substantial enlargement and thickness of peripheral enhancement, now measuring up to 4.5 cm (series 15, image 144, previously 3 cm when measured in a similar fashion). This again extends to the dura. Extensive vasogenic edema in the left parietal and frontal lobes extending into the basal ganglia region has not significantly changed with unchanged 7 mm of rightward midline shift. A 1.0 cm peripherally enhancing lesion in the right occipital lobe is unchanged in size with unchanged minimal surrounding edema (series 15, image 59). Persistent punctate enhancement at the site of a known left cerebellar lesion has increased in intensity but not substantially enlarged, and there is no associated edema (series 15, image 38). No new enhancing brain lesion, acute infarct, or extra-axial fluid collection is identified. The ventricles are unchanged in size without evidence of hydrocephalus. There are chronic blood products associated with the 2 left cerebral lesions, and there is also an unchanged focus of chronic microhemorrhage at the mid to posterior left cingulate gyrus. Vascular: Major intracranial vascular flow voids are preserved. Skull and upper cervical spine: No suspicious marrow lesion. Sinuses/Orbits: Unremarkable orbits. Mild posterior right ethmoid air cell opacification. No significant mastoid fluid. Other: None.  IMPRESSION: 1. Slight enlargement of the lesion in the posterior left frontal operculum and more substantial enlargement of the left parietal lesion with unchanged extensive edema and 7 mm of rightward midline shift. 2. No evidence of new intracranial metastases. Electronically Signed   By: Aundra Lee M.D.   On: 10/09/2023 13:29   MR BRAIN W WO CONTRAST Result Date: 08/21/2023 CLINICAL DATA:  Metastatic disease evaluation. History of metastatic lung cancer. EXAM: MRI HEAD WITHOUT AND WITH CONTRAST TECHNIQUE: Multiplanar, multiecho pulse sequences of the brain and surrounding structures were obtained without and with intravenous contrast. CONTRAST:  10mL GADAVIST  GADOBUTROL  1 MMOL/ML IV SOLN COMPARISON:  Head CT 08/20/2023 and MRI 08/05/2023 FINDINGS: Brain: Heterogeneously enhancing, partially necrotic lesions measuring up to 2.7 cm in the medial aspect of the left postcentral gyrus (series 11, image 47 and series 12, image 14) and 2.1 cm in the lateral aspect of the left precentral gyrus (series 11, image 33 and series 12, image 20) are stable to minimally larger than on the recent prior MRI. Extensive vasogenic edema in the left frontal and parietal lobes has mildly worsened from the prior MRI with increased rightward midline shift now measuring 7 mm. Mild dural enhancement along the left parietal convexity overlying the postcentral gyrus lesion is unchanged. A 1 cm peripherally enhancing lesion in the right occipital lobe is unchanged  in size (series 11, image 23) with minimal surrounding edema which has slightly increased. A punctate focus of faint residual enhancement corresponding to a known left cerebellar lesion is unchanged (series 11, image 13). No acute infarct or extra-axial fluid collection is evident. There are chronic blood products associated with the left cerebral lesions. Patchy T2 hyperintensities elsewhere in the cerebral white matter bilaterally are unchanged and nonspecific but compatible  with mild-to-moderate chronic small vessel ischemic disease. There is partial effacement of the left lateral ventricle. There is no evidence of hydrocephalus. No new enhancing brain lesions are identified. Vascular: Major intracranial vascular flow voids are preserved. Skull and upper cervical spine: No suspicious marrow lesion. Sinuses/Orbits: Unremarkable orbits. Mild mucosal thickening in the paranasal sinuses. Clear mastoid air cells. Other: None. IMPRESSION: 1. Stable to minimally increased size of 2 left cerebral metastases since 08/05/2023. Worsening, extensive vasogenic edema with 7 mm of rightward midline shift. 2. Unchanged size of small right occipital and left cerebellar metastases. 3. No new metastases. Electronically Signed   By: Aundra Lee M.D.   On: 08/21/2023 10:30   Overnight EEG with video Result Date: 08/21/2023 Arleene Lack, MD     08/22/2023  7:25 AM Patient Name: Joshua [name] MRN: [id number] Epilepsy Attending: Arleene Lack Referring Physician/Provider: Khaliqdina, Salman, MD Duration: 08/21/2023 0420 to 2304 Patient history: 68 y.o. 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  He was noted to have twitching of his right side concerning for a seizure. EEG to evaluate for seizure Level of alertness: Awake, asleep AEDs during EEG study: LEV Technical aspects: This EEG study was done with scalp electrodes positioned according to the 10-20 International system of electrode placement. Electrical activity was reviewed with band pass filter of 1-70Hz , sensitivity of 7 uV/mm, display speed of 76mm/sec with a 60Hz  notched filter applied as appropriate. EEG data were recorded continuously and digitally stored.  Video monitoring was  available and reviewed as appropriate. Description: The posterior dominant rhythm consists of 9 Hz activity of moderate voltage (25-35 uV) seen predominantly in posterior head regions, symmetric and reactive to eye opening and eye closing. Sleep was characterized by vertex waves, sleep spindles (12 to 14 Hz), maximal frontocentral region. EEG showed intermittent 3 to 5 Hz theta-delta slowing in left temporal region. Hyperventilation and photic stimulation were not performed.  EEG was disconnected between 0906 to 1001 and between 1554 to 1845 ABNORMALITY - Intermittent slow, left temporal region. IMPRESSION: This study is suggestive of cortical dysfunction arising from left temporal region likely secondary to underlying structural abnormality/ tumor. No seizures or epileptiform discharges were seen throughout the recording. Arleene Lack   CT HEAD CODE STROKE WO CONTRAST Result Date: 08/20/2023 CLINICAL DATA:  Code stroke.  Neuro deficit, acute, stroke suspected EXAM: CT HEAD WITHOUT CONTRAST TECHNIQUE: Contiguous axial images were obtained from the base of the skull through the vertex without intravenous contrast. RADIATION DOSE REDUCTION: This exam was performed according to the departmental dose-optimization program which includes automated exposure control, adjustment of the mA and/or kV according to patient size and/or use of iterative reconstruction technique. COMPARISON:  None Available. FINDINGS: Motion limited study.  Within this limitation: Brain: Increasing edema in the left frontoparietal region. No definite new acute  hemorrhage. Small focus of hyperdensity in the left frontal region likely represents mineralization or hemorrhage with in the known metastasis, likely present on prior MRI. No obvious superimposed acute large vascular territory infarct. No hydrocephalus. Vascular: Not well assessed. Skull: No acute fracture. Sinuses/Orbits: Clear sinuses.  No acute orbital findings. Other: No mastoid  effusions. IMPRESSION: Increasing left frontoparietal edema and mass effect with 9 mm of right midline shift, likely related to worsening metastatic disease given findings on recent MRI head. Repeat MRI head with contrast could confirm and better assess if clinically warranted. Code stroke imaging results were communicated on 08/20/2023 at 11:15 pm to provider [facility] via telephone, who verbally acknowledged these results. Electronically Signed   By: Stevenson Elbe M.D.   On: 08/20/2023 23:20

## 2023-11-18 NOTE — Patient Instructions (Signed)
 [facility] - A DEPT OF [facility]  Discharge Instructions: Thank you for choosing [facility] to provide your oncology and hematology care.  If you have a lab appointment with the Cancer Center, please go directly to the Cancer Center and check in at the registration area.  Wear comfortable clothing and clothing appropriate for easy access to any Portacath or PICC line.   We strive to give you quality time with your provider. You may need to reschedule your appointment if you arrive late (15 or more minutes).  Arriving late affects you and other patients whose appointments are after yours.  Also, if you miss three or more appointments without notifying the office, you may be dismissed from the clinic at the provider's discretion.      For prescription refill requests, have your pharmacy contact our office and allow 72 hours for refills to be completed.    Today you received the following chemotherapy and/or immunotherapy agents- [facility]      To help prevent nausea and vomiting after your treatment, we encourage you to take your nausea medication as directed.  BELOW ARE SYMPTOMS THAT SHOULD BE REPORTED IMMEDIATELY: *FEVER GREATER THAN 100.4 F (38 C) OR HIGHER *CHILLS OR SWEATING *NAUSEA AND VOMITING THAT IS NOT CONTROLLED WITH YOUR NAUSEA MEDICATION *UNUSUAL SHORTNESS OF BREATH *UNUSUAL BRUISING OR BLEEDING *URINARY PROBLEMS (pain or burning when urinating, or frequent urination) *BOWEL PROBLEMS (unusual diarrhea, constipation, pain near the anus) TENDERNESS IN MOUTH AND THROAT WITH OR WITHOUT PRESENCE OF ULCERS (sore throat, sores in mouth, or a toothache) UNUSUAL RASH, SWELLING OR PAIN  UNUSUAL VAGINAL DISCHARGE OR ITCHING   Items with * indicate a potential emergency and should be followed up as soon as possible or go to the Emergency Department if any problems should occur.  Please show the CHEMOTHERAPY ALERT CARD or IMMUNOTHERAPY ALERT  CARD at check-in to the Emergency Department and triage nurse.  Should you have questions after your visit or need to cancel or reschedule your appointment, please contact [facility] - A DEPT OF [facility]  [phone] and follow the prompts.  Office hours are 8:00 a.m. to 4:30 p.m. Monday - Friday. Please note that voicemails left after 4:00 p.m. may not be returned until the following business day.  We are closed weekends and major holidays. You have access to a nurse at all times for urgent questions. Please call the main number to the clinic [phone] and follow the prompts.  For any non-urgent questions, you may also contact your provider using MyChart. We now offer e-Visits for anyone 76 and older to request care online for non-urgent symptoms. For details visit [email or web address].   Also download the MyChart app! Go to the app store, search "MyChart", open the app, select [place], and log in with your MyChart username and password.

## 2023-11-18 NOTE — Assessment & Plan Note (Signed)
 Follow up with Dr. Mark Sil.  Continue keppra and course of  dexamethasone as advised by Dr. Mark Sil.   Recommend patient to avoid driving until cleared by neurology.

## 2023-11-18 NOTE — Assessment & Plan Note (Addendum)
 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  Dec CT showed stable disease discussed with patient.  Labs are reviewed and discussed with patient. Hold off Durvalumab .  Due to abnormal LFT

## 2023-11-19 ENCOUNTER — Ambulatory Visit
Admission: RE | Admit: 2023-11-19 | Discharge: 2023-11-19 | Disposition: A | Discharge status: Home / Self Care | Source: Ambulatory Visit | Attending: Radiation Oncology | Admitting: Radiation Oncology

## 2023-11-19 ENCOUNTER — Other Ambulatory Visit: Payer: Self-pay

## 2023-11-19 DIAGNOSIS — C3411 Malignant neoplasm of upper lobe, right bronchus or lung: Secondary | ICD-10-CM | POA: Insufficient documentation

## 2023-11-19 DIAGNOSIS — Z510 Encounter for antineoplastic radiation therapy: Secondary | ICD-10-CM | POA: Insufficient documentation

## 2023-11-19 DIAGNOSIS — C7931 Secondary malignant neoplasm of brain: Secondary | ICD-10-CM | POA: Diagnosis not present

## 2023-11-19 DIAGNOSIS — Z452 Encounter for adjustment and management of vascular access device: Secondary | ICD-10-CM | POA: Insufficient documentation

## 2023-11-19 LAB — RAD ONC ARIA SESSION SUMMARY
Course Elapsed Days: 2
Plan Fractions Treated to Date: 2
Plan Prescribed Dose Per Fraction: 6 Gy
Plan Total Fractions Prescribed: 5
Plan Total Prescribed Dose: 30 Gy
Reference Point Dosage Given to Date: 12 Gy
Reference Point Session Dosage Given: 6 Gy
Session Number: 2

## 2023-11-19 NOTE — Progress Notes (Signed)
 Joshua [name] rested with us  for 15 minutes following his SRS treatment.  Patient denies headache, dizziness, nausea, diplopia or ringing in the ears. Denies fatigue. Patient without complaints. Understands to avoid strenuous activity for the next 24 hours and call [phone] with needs.   BP (!) 144/81   Pulse 82   Temp (!) 97.1 F (36.2 C)   Resp 18   SpO2 98%

## 2023-11-23 ENCOUNTER — Ambulatory Visit
Admission: RE | Admit: 2023-11-23 | Discharge: 2023-11-23 | Disposition: A | Discharge status: Home / Self Care | Source: Ambulatory Visit | Attending: Radiation Oncology | Admitting: Radiation Oncology

## 2023-11-23 ENCOUNTER — Other Ambulatory Visit: Payer: Self-pay

## 2023-11-23 DIAGNOSIS — Z510 Encounter for antineoplastic radiation therapy: Secondary | ICD-10-CM | POA: Diagnosis not present

## 2023-11-23 LAB — RAD ONC ARIA SESSION SUMMARY
Course Elapsed Days: 6
Plan Fractions Treated to Date: 3
Plan Prescribed Dose Per Fraction: 6 Gy
Plan Total Fractions Prescribed: 5
Plan Total Prescribed Dose: 30 Gy
Reference Point Dosage Given to Date: 18 Gy
Reference Point Session Dosage Given: 6 Gy
Session Number: 3

## 2023-11-23 NOTE — Progress Notes (Signed)
 Joshua [name] to nursing for observation for 15 minutes following his SRS treatment 3 of 5 completed.  Patient denies headache, dizziness, nausea, diplopia or ringing in the ears. Denies fatigue. Patient without complaints. Understands to avoid strenuous activity for the next 24 hours and call [phone] with needs.  Vitals:  97.1-72-18-151/85 O2 sat 100% RA.

## 2023-11-24 ENCOUNTER — Other Ambulatory Visit: Payer: Self-pay

## 2023-11-24 ENCOUNTER — Encounter: Payer: Self-pay | Admitting: Oncology

## 2023-11-25 ENCOUNTER — Other Ambulatory Visit: Payer: Self-pay

## 2023-11-25 ENCOUNTER — Ambulatory Visit
Admission: RE | Admit: 2023-11-25 | Discharge: 2023-11-25 | Disposition: A | Discharge status: Home / Self Care | Source: Ambulatory Visit | Attending: Radiation Oncology | Admitting: Radiation Oncology

## 2023-11-25 ENCOUNTER — Encounter: Payer: Self-pay | Admitting: Oncology

## 2023-11-25 DIAGNOSIS — Z510 Encounter for antineoplastic radiation therapy: Secondary | ICD-10-CM | POA: Diagnosis not present

## 2023-11-25 LAB — RAD ONC ARIA SESSION SUMMARY
Course Elapsed Days: 8
Plan Fractions Treated to Date: 4
Plan Prescribed Dose Per Fraction: 6 Gy
Plan Total Fractions Prescribed: 5
Plan Total Prescribed Dose: 30 Gy
Reference Point Dosage Given to Date: 24 Gy
Reference Point Session Dosage Given: 6 Gy
Session Number: 4

## 2023-11-25 NOTE — Progress Notes (Signed)
 Joshua [name] rested with us  for 15 minutes following his SRS treatment.  Patient denies headache, dizziness, nausea, diplopia or ringing in the ears. Denies fatigue. Patient without complaints. Understands to avoid strenuous activity for the next 24 hours and call [phone] with needs.   BP (!) 169/86 (BP Location: Left Arm, Patient Position: Sitting, Cuff Size: Normal)   Pulse 70   Temp 97.7 F (36.5 C)   Resp 20   SpO2 100%

## 2023-11-26 ENCOUNTER — Telehealth: Payer: Self-pay | Admitting: *Deleted

## 2023-11-26 ENCOUNTER — Other Ambulatory Visit: Payer: Self-pay | Admitting: Internal Medicine

## 2023-11-26 ENCOUNTER — Other Ambulatory Visit: Payer: Self-pay

## 2023-11-26 MED FILL — Levetiracetam Tab 1000 MG: ORAL | 30 days supply | Qty: 60 | Fill #0 | Status: CN

## 2023-11-26 NOTE — Telephone Encounter (Signed)
 Received PC from patient, he is asking if Dr Mark Sil can prescribe a 90 day supply of lamictal , he states he can get this medication for free, he is currently taking keppra .  Per Dr Mark Sil, the patient will need to titrate up to a therapeutic dose of lamical.  Informed patient this will be discussed at his upcoming appointment in June, patient verbalizes understanding.

## 2023-11-27 ENCOUNTER — Encounter: Payer: Self-pay | Admitting: Oncology

## 2023-11-27 ENCOUNTER — Ambulatory Visit
Admission: RE | Admit: 2023-11-27 | Discharge: 2023-11-27 | Disposition: A | Discharge status: Home / Self Care | Source: Ambulatory Visit | Attending: Radiation Oncology | Admitting: Radiation Oncology

## 2023-11-27 ENCOUNTER — Other Ambulatory Visit: Payer: Self-pay

## 2023-11-27 ENCOUNTER — Other Ambulatory Visit: Payer: Self-pay | Admitting: Oncology

## 2023-11-27 DIAGNOSIS — Z87891 Personal history of nicotine dependence: Secondary | ICD-10-CM | POA: Diagnosis not present

## 2023-11-27 DIAGNOSIS — C3411 Malignant neoplasm of upper lobe, right bronchus or lung: Secondary | ICD-10-CM | POA: Diagnosis not present

## 2023-11-27 DIAGNOSIS — Z510 Encounter for antineoplastic radiation therapy: Secondary | ICD-10-CM | POA: Diagnosis not present

## 2023-11-27 DIAGNOSIS — C7931 Secondary malignant neoplasm of brain: Secondary | ICD-10-CM | POA: Diagnosis not present

## 2023-11-27 LAB — RAD ONC ARIA SESSION SUMMARY
Course Elapsed Days: 10
Plan Fractions Treated to Date: 5
Plan Prescribed Dose Per Fraction: 6 Gy
Plan Total Fractions Prescribed: 5
Plan Total Prescribed Dose: 30 Gy
Reference Point Dosage Given to Date: 30 Gy
Reference Point Session Dosage Given: 6 Gy
Session Number: 5

## 2023-11-27 NOTE — Progress Notes (Signed)
 Joshua [name] rested with us  for 15 minutes following his SRS treatment.  Patient denies headache, dizziness, nausea, diplopia or ringing in the ears. Denies fatigue. Patient without complaints. Understands to avoid strenuous activity for the next 24 hours and call [phone] with needs.   BP 117/67   Pulse 80   Temp (!) 97 F (36.1 C)   Resp 19   SpO2 99%

## 2023-11-30 ENCOUNTER — Other Ambulatory Visit: Payer: Self-pay

## 2023-11-30 ENCOUNTER — Other Ambulatory Visit: Payer: Self-pay | Admitting: Oncology

## 2023-11-30 NOTE — Radiation Completion Notes (Signed)
  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      Intent: Palliative     ==========DELIVERED PLANS==========  First Treatment Date: 2023-11-17 Last Treatment Date: 2023-11-27   Plan Name: Brain_SRSreTx Site: Brain PTV_6_Lt_Parietal_37mm PTV_7_Lt_Fontal_65mm Technique: IMRT Mode: Photon Dose Per Fraction: 6 Gy Prescribed Dose (Delivered / Prescribed): 30 Gy / 30 Gy Prescribed Fxs (Delivered / Prescribed): 5 / 5     ==========ON TREATMENT VISIT DATES========== 2023-11-17, 2023-11-19, 2023-11-23, 2023-11-25    See weekly On Treatment Notes in Epic for details in the Media tab (listed as Progress notes on the On Treatment Visit Dates listed above). The patient tolerated radiation.    The patient will receive a call in about one month from the radiation oncology department. He will continue follow up with Dr. Wilhelmenia Harada and Dr. Mark Sil as well.      Shelvia Dick, PAC

## 2023-12-01 ENCOUNTER — Encounter (INDEPENDENT_AMBULATORY_CARE_PROVIDER_SITE_OTHER): Payer: Self-pay | Admitting: Nurse Practitioner

## 2023-12-01 ENCOUNTER — Other Ambulatory Visit: Payer: Self-pay

## 2023-12-01 ENCOUNTER — Ambulatory Visit (INDEPENDENT_AMBULATORY_CARE_PROVIDER_SITE_OTHER): Admitting: Nurse Practitioner

## 2023-12-01 VITALS — BP 155/84 | HR 88 | Resp 18 | Wt 218.0 lb

## 2023-12-01 DIAGNOSIS — Z9889 Other specified postprocedural states: Secondary | ICD-10-CM | POA: Diagnosis not present

## 2023-12-01 DIAGNOSIS — I10 Essential (primary) hypertension: Secondary | ICD-10-CM | POA: Diagnosis not present

## 2023-12-01 DIAGNOSIS — E782 Mixed hyperlipidemia: Secondary | ICD-10-CM | POA: Diagnosis not present

## 2023-12-01 DIAGNOSIS — I739 Peripheral vascular disease, unspecified: Secondary | ICD-10-CM | POA: Diagnosis not present

## 2023-12-02 ENCOUNTER — Inpatient Hospital Stay (HOSPITAL_BASED_OUTPATIENT_CLINIC_OR_DEPARTMENT_OTHER): Admitting: Oncology

## 2023-12-02 ENCOUNTER — Encounter: Payer: Self-pay | Admitting: Oncology

## 2023-12-02 ENCOUNTER — Inpatient Hospital Stay

## 2023-12-02 ENCOUNTER — Inpatient Hospital Stay: Attending: Oncology

## 2023-12-02 ENCOUNTER — Other Ambulatory Visit: Payer: Self-pay

## 2023-12-02 VITALS — BP 161/82 | HR 73 | Temp 97.0°F | Resp 19

## 2023-12-02 VITALS — BP 135/78 | HR 77 | Temp 98.4°F | Wt 222.2 lb

## 2023-12-02 DIAGNOSIS — E032 Hypothyroidism due to medicaments and other exogenous substances: Secondary | ICD-10-CM

## 2023-12-02 DIAGNOSIS — C7931 Secondary malignant neoplasm of brain: Secondary | ICD-10-CM

## 2023-12-02 DIAGNOSIS — Z5112 Encounter for antineoplastic immunotherapy: Secondary | ICD-10-CM | POA: Insufficient documentation

## 2023-12-02 DIAGNOSIS — R569 Unspecified convulsions: Secondary | ICD-10-CM

## 2023-12-02 DIAGNOSIS — R7989 Other specified abnormal findings of blood chemistry: Secondary | ICD-10-CM | POA: Diagnosis not present

## 2023-12-02 DIAGNOSIS — C3411 Malignant neoplasm of upper lobe, right bronchus or lung: Secondary | ICD-10-CM | POA: Insufficient documentation

## 2023-12-02 DIAGNOSIS — C3491 Malignant neoplasm of unspecified part of right bronchus or lung: Secondary | ICD-10-CM

## 2023-12-02 LAB — CBC WITH DIFFERENTIAL/PLATELET
Abs Immature Granulocytes: 0.07 10*3/uL (ref 0.00–0.07)
Basophils Absolute: 0 10*3/uL (ref 0.0–0.1)
Basophils Relative: 1 %
Eosinophils Absolute: 0 10*3/uL (ref 0.0–0.5)
Eosinophils Relative: 0 %
HCT: 35.7 % — ABNORMAL LOW (ref 39.0–52.0)
Hemoglobin: 11.8 g/dL — ABNORMAL LOW (ref 13.0–17.0)
Immature Granulocytes: 1 %
Lymphocytes Relative: 12 %
Lymphs Abs: 0.8 10*3/uL (ref 0.7–4.0)
MCH: 29 pg (ref 26.0–34.0)
MCHC: 33.1 g/dL (ref 30.0–36.0)
MCV: 87.7 fL (ref 80.0–100.0)
Monocytes Absolute: 0.7 10*3/uL (ref 0.1–1.0)
Monocytes Relative: 10 %
Neutro Abs: 5.2 10*3/uL (ref 1.7–7.7)
Neutrophils Relative %: 76 %
Platelets: 149 10*3/uL — ABNORMAL LOW (ref 150–400)
RBC: 4.07 MIL/uL — ABNORMAL LOW (ref 4.22–5.81)
RDW: 15.5 % (ref 11.5–15.5)
WBC: 6.8 10*3/uL (ref 4.0–10.5)
nRBC: 0.3 % — ABNORMAL HIGH (ref 0.0–0.2)

## 2023-12-02 LAB — COMPREHENSIVE METABOLIC PANEL WITH GFR
ALT: 118 U/L — ABNORMAL HIGH (ref 0–44)
AST: 67 U/L — ABNORMAL HIGH (ref 15–41)
Albumin: 3.4 g/dL — ABNORMAL LOW (ref 3.5–5.0)
Alkaline Phosphatase: 56 U/L (ref 38–126)
Anion gap: 8 (ref 5–15)
BUN: 15 mg/dL (ref 8–23)
CO2: 24 mmol/L (ref 22–32)
Calcium: 8.2 mg/dL — ABNORMAL LOW (ref 8.9–10.3)
Chloride: 108 mmol/L (ref 98–111)
Creatinine, Ser: 0.9 mg/dL (ref 0.61–1.24)
GFR, Estimated: >60 mL/min (ref >60)
Glucose, Bld: 98 mg/dL (ref 70–99)
Potassium: 3.8 mmol/L (ref 3.5–5.1)
Sodium: 140 mmol/L (ref 135–145)
Total Bilirubin: 0.4 mg/dL (ref 0.0–1.2)
Total Protein: 6.4 g/dL — ABNORMAL LOW (ref 6.5–8.1)

## 2023-12-02 LAB — PROTEIN, URINE, RANDOM: Total Protein, Urine: 11 mg/dL

## 2023-12-02 MED ORDER — HEPARIN SOD (PORK) LOCK FLUSH 100 UNIT/ML IV SOLN
500.0000 [IU] | Freq: Once | INTRAVENOUS | Status: AC | PRN
  Administered 2023-12-02: 500 [IU]
  Filled 2023-12-02: qty 5

## 2023-12-02 MED ORDER — SODIUM CHLORIDE 0.9 % IV SOLN
10.0000 mg/kg | Freq: Once | INTRAVENOUS | Status: AC
  Administered 2023-12-02: 1000 mg via INTRAVENOUS
  Filled 2023-12-02: qty 32

## 2023-12-02 MED ORDER — SODIUM CHLORIDE 0.9 % IV SOLN
Freq: Once | INTRAVENOUS | Status: AC
Start: 2023-12-02 — End: 2023-12-02
  Filled 2023-12-02: qty 250

## 2023-12-02 MED FILL — Levothyroxine Sodium Tab 125 MCG: ORAL | 90 days supply | Qty: 90 | Fill #0 | Status: AC

## 2023-12-02 NOTE — Assessment & Plan Note (Signed)
 continue levothyroxine 125 mcg daily

## 2023-12-02 NOTE — Assessment & Plan Note (Addendum)
 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  Labs are reviewed and discussed with patient. Hold off Durvalumab .  Due to abnormal LFT

## 2023-12-02 NOTE — Assessment & Plan Note (Addendum)
 Possibly due to immunotherapy/medication side effects.  Hold immunotherapy. Check hepatitis panel.

## 2023-12-02 NOTE — Assessment & Plan Note (Addendum)
 Follow up with Dr. Mark Sil.  Continue keppra  and course of  dexamethasone  as advised by Dr. Mark Sil.

## 2023-12-02 NOTE — Assessment & Plan Note (Signed)
 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  PET brain finding suggests recurrence -NeuroOnc recommend Radiation and Bevacizumab   He is currently on tapering courses of dexamethasone , managed by Dr. Mark Sil.   Proceed with Bevacizumab  10mg /kg Q2 weeks

## 2023-12-02 NOTE — Progress Notes (Signed)
 Hematology/Oncology Progress note [phone] [phone]    CHIEF COMPLAINTS/REASON FOR VISIT:  Follow up for lung cancer  ASSESSMENT & PLAN:   Cancer Staging  Squamous cell carcinoma of right lung (HCC) Staging form: Lung, AJCC 8th Edition - Clinical stage from 09/20/2020: Stage IV (cT1b, cN2, cM1) - Signed by [name], MD on 12/13/2021   Squamous cell carcinoma of right lung (HCC) Durvalumab  was previously held due to pneumonitis.  Labs are reviewed and discussed with patient. Hold off Durvalumab .  Due to abnormal LFT  Malignant neoplasm metastatic to brain [facility]) 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  PET brain finding suggests recurrence -NeuroOnc recommend Radiation and Bevacizumab   He is currently on tapering courses of dexamethasone , managed by Dr. Mark Sil.   Proceed with Bevacizumab  10mg /kg Q2 weeks  Seizure (HCC) Follow up with Dr. Mark Sil.  Continue keppra  and course of  dexamethasone  as advised by Dr. Mark Sil.     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  Hold immunotherapy. Check hepatitis panel.    Follow up 2 weeks lab MD Durvalumab + Bevacizumab  All questions were answered. The patient knows to call the clinic with any problems, questions or concerns.  [name], MD, PhD [facility] Hematology Oncology 12/02/2023       HISTORY OF PRESENTING ILLNESS:  Joshua [name] is a 68 y.o. male who has above history reviewed by me today presents for follow up visit for Stage IV lung squamous cell carcinoma. Oncology History  Squamous cell carcinoma of right lung (HCC)  09/18/2020 Imaging   PET scan showed right upper lobe nodule maximal SUV 11.5.  Hypermetabolic cluster right paratracheal adenopathy with maximum SUV up to 12.  No findings of metastatic disease to the neck/abdomen/pelvis or skeleton.   09/20/2020 Initial Diagnosis   Squamous cell  carcinoma of right lung (HCC) NGS showed PD-L1 TPS 95%, LRP1B S925fs, PIK3CA E545K, RB1 c1422-2A>T, RIT1 T83_ A84del, STK11 P285fs, TPS53 R248W, TMB 19.96mut/mb, MS stable.    09/20/2020 Cancer Staging   Staging form: Lung, AJCC 8th Edition - Clinical stage from 09/20/2020: Stage IV (cT1b, cN2, cM1) - Signed by [name], MD on 12/13/2021   10/03/2020 Imaging   MRI brain with and without contrast showed no evidence of intracranial metastatic disease. Well-defined round lesion within the left parotid tail measuring approximately 2.3 x 1.8 x 1.8 cm   10/09/2020 - 11/27/2020 Radiation Therapy   concurrent chemoradiation   10/10/2020 - 10/31/2020 Chemotherapy   10/10/2020 concurrent chemoradiation.  carboplatin  AUC of 2 and Taxol  45 mg/m2 x 4.  Additional chemotherapy was held due to thrombocytopenia, neutropenia       12/31/2020 - 03/21/2022 Chemotherapy   Maintenance  Durvalumab  q14d      12/31/2020 -  Chemotherapy   Patient is on Treatment Plan : LUNG Durvalumab  (10) q14d     03/28/2021 Imaging   CT without contrast showed new linear right perihilar consolidation with associated traction bronchiectasis.  Likely postradiation changes.  Additional new peribronchial vascular/nodular groundglass opacities with areas of consolidation or seen primarily in the right upper and lower lobes, more distant from expected radiation fields. 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  Per my discussion with pulmonology Dr. 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     05/09/2021 Imaging   Repeat CT chest without contrast showed  evolving radiation changes in the right perihilar region likely accounting for interval increase to thickening of the posterior wall of the right upper lobe bronchus.  The more peripheral right lung groundglass opacities seen on most recent study have generally  improved with exception of one peripherally right upper lobe which appears new.  These are likely inflammatory.  No findings highly suspicious for local recurrence.   05/30/2021 - 01/10/2022 Chemotherapy   Durvalumab  q14d     10/07/2021 Imaging   MRI brain with and without contrast showed 2 contrast-enhancing lesions, most consistent with metastatic disease.  Largest lesion is in the left frontal lobe and measures up to 1.3 cm with a large amount of surrounding edema and 9 mm rightward midline shift.  The other enhancing lesion was 4 mm in the right occipital lobe with a small amount of surrounding edema.  No acute or chronic hemorrhage.   10/07/2021 Progression   Headache after colonoscopy, progressive difficulty using her right arm.  Progressively worsening of speech. Stage IV lung cancer with brain metastasis.  started on Dexamethasone .  He was seen by neurosurgery and radiation oncology.  Recommendation is to proceed with SRS.  Patient was discharged with dexamethasone    10/16/2021 Imaging   PET scan showed postradiation changes in the right supra hilar lung without evidence of local recurrence.  No evidence of lung cancer recurrence or metastatic disease.  Single focus of intense metabolic activity associated with posterior left 11th rib likely secondary to recent fall/posttraumatic metabolic changes.  Frontal  uptake in the left frontal lobe corresponding to the known metastatic lesion in the brain   10/29/2021 -  Radiation Therapy   Brain Radiation [facility]   12/10/2021 Imaging   Brain MRI: Continued interval decrease in size of a left frontal lobe metastasis, now measuring 10 mm. Moderate surrounding edema has also decreased.  3-4 mm metastasis within the right occipital lobe, not significantly changed in size. There is now a small focus of central non enhancement within this lesion. Increased edema. 3 mm enhancing metastasis within the high left parietal lobe. In retrospect, this was likely present  as a subtle punctate focus of enhancement on the prior brain MRI of 10/22/2021.   01/18/2022 Imaging   CT chest abdomen pelvis w contrast 1. Stable post treatment changes in the right upper lobe medially with dense radiation fibrosis but no findings to suggest local recurrent tumor. 2. Significant progression of ill-defined ground-glass opacity, interstitial thickening and airspace nodularity in the right upper lobe and upper aspect of the right lower lobe. Findings could reflect an inflammatory or atypical infectious process, drug induced pneumonitis or interstitial spread of tumor.3. No mediastinal or hilar mass or adenopathy. 4. No findings for abdominal/pelvic metastatic disease or osseous metastatic disease.   02/19/2022 Imaging   MRI brain w wo contrast showed Three metastatic deposits in the brain appear smaller. Decreased edema in the left parietal lobe. No new lesions    04/04/2022 Imaging   CT chest w contrast 1. Stable radiation changes in the right perihilar region without evidence of local recurrence. 2. Interval improvement in the patchy ground-glass opacities previously demonstrated in both lungs, most consistent with a resolving infectious or inflammatory process (including immunotherapy-related pneumonitis). 3. No evidence of metastatic disease.   05/14/2022 Miscellaneous   He had a mechanical fall and fractured his left elbow. xrays showed a left elbow fracture  05/26/22 seen by orthopedic surgeon, cast applied.    05/27/2022 Imaging   MRI brain w wo contrast  1. All three known brain metastases have enlarged since August, with increasing regional edema. And there is a small new left anterior cerebellar 4-5 mm metastasis (was questionably punctate on the May MRI). This constellation could reflect a combination of True Progression (Left Cerebellar lesion) and pseudoprogression/radionecrosis (3 existing mets).    2. No significant intracranial mass effect. 3. Underlying moderate  cerebral white matter signal changes.   05/30/2022 Imaging   CT chest abdomen pelvis w contrast  1. 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 There is a stable small right pleural effusion, but no definitive findings to suggest residual or recurrent disease on today's examination. 2. No signs of metastatic disease in the abdomen or pelvis.  3. Aortic atherosclerosis, in addition to two-vessel coronary artery disease. Please note that although the presence of coronary artery calcium  documents the presence of coronary artery disease, the severity of this disease and any potential stenosis cannot be assessed on this non-gated CT examination. Assessment for potential risk factor modification, dietary therapy or pharmacologic therapy may be warranted, if clinically indicated. 4. Hepatic steatosis. 5. Colonic diverticulosis without evidence of acute diverticulitis at this time. 6. Incompletely imaged but apparently occluded vascular graft in the superior aspect of the right anterior thigh. Referral to vascular specialist for further clinical evaluation should be considered if clinically appropriate. 7. Additional incidental findings, as above.   07/22/2022 Imaging   MRI brain  1. No new or enlarging lesions. No intracranial disease progression. 2. Decreased size of treated lesions of the left frontoparietal junction and left postcentral gyrus. 3. Unchanged lesions in the left cerebellum and right occipital lobe.   10/15/2022 Imaging   MRI brain w wo contrast showed Interval increase in size of multiple intraparenchymal lesions with worsened surrounding vasogenic edema. 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 No new lesions   11/05/2022 Imaging   MRI brain w wo contrast  1. Likely slight increase and  right occipital and left cerebellar lesions. Otherwise, unchanged metastases, as above. No new lesions identified. 2. Partially imaged large probable lymph node in the left upper neck, apparent on prior PET CT.   11/13/2022 -  Radiation Therapy   Each of the sites were treated with SRS/SBRT/SRT-IMRT technique to 20 Gy in 1 fraction to each of the following sites: PTV_2_LCerebel_69mm PTV_3_RCerebel_61mm PTV_4_ROccip_55mm PTV_5_LParietal_45mm   11/27/2022 Imaging   CT chest abdomen pelvis w contrast showed  Improving bilateral scattered interstitial and ground-glass opacities. Small amount of residual areas in the extreme left lower lobe inferiorly. This specific area is slightly increased from previous but overall the amount of opacities is significantly improved. Recommend continued follow-up. No developing new mass lesion, fluid collection or lymph node enlargement. Stable posttreatment changes right perihilar in the lung. Fatty liver infiltration. Colonic diverticulosis. Improved tiny right pleural effusion   02/05/2023 Imaging   MRI brain with and without contrast Interval decrease in all of the metastatic lesions, detailed above.No new lesions identified.    Imaging   CT chest abdomen pelvis w contrast showed  1. 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 2. Trace right pleural fluid or thickening, new or minimally increased. 3. Incidental findings, including: Aortic atherosclerosis (ICD10-I70.0), coronary artery atherosclerosis and emphysema (ICD10-J43.9). Hepatic steatosis.   06/25/2023 Imaging   CT chest abdomen pelvis w contrast showed 1. Unchanged post treatment/post radiation appearance  of the right lung, with perihilar and suprahilar paramedian fibrosis and consolidation. 2. No evidence of lymphadenopathy or metastatic disease in the chest, abdomen, or pelvis. 3. Hepatic steatosis. 4. Coronary artery disease.    Aortic Atherosclerosis (ICD10-I70.0) and Emphysema (ICD10-J43.9).   08/26/2023 [facility] Admission   hospitalization due to was observed speech and jerking movements on right side despite being on dexamethasone . Continuous EEG suggest cortical dysfunction arising from left temporal region.  No seizure or elective form discharges were seen. Patient was discharged on dexamethasone  4 mg, Keppra , lamotrigine .    11/04/2023 -  Chemotherapy   Add Bevacizumab  5mg /kg to Durvalumab .     11/17/2023 - 11/27/2023 Radiation Therapy   Patient received radiation to brain for treatment of recurrence.     chronic SOB,not worse.  Patient denies any additional seizure episodes.  No additional episodes.  He denies any falls. S/p Brain radiation.  On a tapering course of dexamethasone .  Review of Systems  Constitutional:  Positive for fatigue. Negative for appetite change, chills, fever and unexpected weight change.  HENT:   Negative for hearing loss and voice change.   Eyes:  Negative for eye problems and icterus.  Respiratory:  Negative for chest tightness, cough and shortness of breath.   Cardiovascular:  Positive for leg swelling. Negative for chest pain.  Gastrointestinal:  Negative for abdominal distention and abdominal pain.  Endocrine: Negative for hot flashes.  Genitourinary:  Negative for difficulty urinating, dysuria and frequency.   Musculoskeletal:  Positive for gait problem. Negative for arthralgias.  Skin:  Negative for itching and rash.  Neurological:  Positive for gait problem. Negative for extremity weakness, headaches, numbness and speech difficulty.  Hematological:  Negative for adenopathy. Bruises/bleeds easily.  Psychiatric/Behavioral:  Negative for confusion.       MEDICAL HISTORY:  Past Medical History:  Diagnosis Date   Allergy    Cancer (HCC)    lung   COPD (chronic obstructive pulmonary disease) (HCC)    Dyspnea    former smoker. only doe   History of kidney stones     Hypertension    Hypothyroidism due to medication 04/04/2021   Malignant neoplasm metastatic to brain [facility])    Peripheral vascular disease (HCC)    Pre-diabetes    Sleep apnea     SURGICAL HISTORY: Past Surgical History:  Procedure Laterality Date   ANTERIOR CERVICAL DECOMP/DISCECTOMY FUSION N/A 03/14/2020   Procedure: ANTERIOR CERVICAL DECOMPRESSION/DISCECTOMY FUSION 3 LEVELS C4-7;  Surgeon: Jodeen Munch, MD;  Location: [facility] ORS;  Service: Neurosurgery;  Laterality: N/A;   BACK SURGERY  2011   CENTRAL LINE INSERTION Right 09/10/2016   Procedure: CENTRAL LINE INSERTION;  Surgeon: Jackquelyn Mass, MD;  Location: [facility] ORS;  Service: Vascular;  Laterality: Right;   CERVICAL FUSION     C4-c7 on Mar 14, 2020   COLONOSCOPY  2013 ?   COLONOSCOPY N/A 09/09/2021   Procedure: COLONOSCOPY;  Surgeon: Quintin Buckle, DO;  Location: [facility] ENDOSCOPY;  Service: Gastroenterology;  Laterality: N/A;   CORONARY ANGIOPLASTY     ESOPHAGOGASTRODUODENOSCOPY N/A 09/09/2021   Procedure: ESOPHAGOGASTRODUODENOSCOPY (EGD);  Surgeon: Quintin Buckle, DO;  Location: [facility] ENDOSCOPY;  Service: Gastroenterology;  Laterality: N/A;   FEMORAL-POPLITEAL BYPASS GRAFT Right 09/10/2016   Procedure: BYPASS GRAFT FEMORAL-POPLITEAL ARTERY ( BELOW KNEE );  Surgeon: Jackquelyn Mass, MD;  Location: [facility] ORS;  Service: Vascular;  Laterality: Right;   FEMORAL-TIBIAL BYPASS GRAFT Right 03/23/2020   Procedure: BYPASS GRAFT RIGHT FEMORAL- DISTAL TIBIAL ARTERY WITH  DISTAL FLOW GRAFT;  Surgeon: Jackquelyn Mass, MD;  Location: [facility] ORS;  Service: Vascular;  Laterality: Right;   LEFT HEART CATH AND CORONARY ANGIOGRAPHY Left 05/16/2021   Procedure: LEFT HEART CATH AND CORONARY ANGIOGRAPHY;  Surgeon: Link Rice, MD;  Location: [facility] INVASIVE CV LAB;  Service: Cardiovascular;  Laterality: Left;   LOWER EXTREMITY ANGIOGRAPHY Right 11/18/2016   Procedure: Lower Extremity Angiography;  Surgeon: Jackquelyn Mass, MD;   Location: [facility] INVASIVE CV LAB;  Service: Cardiovascular;  Laterality: Right;   LOWER EXTREMITY ANGIOGRAPHY Right 12/09/2016   Procedure: Lower Extremity Angiography;  Surgeon: Jackquelyn Mass, MD;  Location: [facility] INVASIVE CV LAB;  Service: Cardiovascular;  Laterality: Right;   LOWER EXTREMITY ANGIOGRAPHY Right 11/15/2019   Procedure: LOWER EXTREMITY ANGIOGRAPHY;  Surgeon: Jackquelyn Mass, MD;  Location: [facility] INVASIVE CV LAB;  Service: Cardiovascular;  Laterality: Right;   LOWER EXTREMITY ANGIOGRAPHY Right 12/01/2019   Procedure: Lower Extremity Angiography;  Surgeon: Celso College, MD;  Location: [facility] INVASIVE CV LAB;  Service: Cardiovascular;  Laterality: Right;   LOWER EXTREMITY ANGIOGRAPHY Right 12/02/2019   Procedure: LOWER EXTREMITY ANGIOGRAPHY;  Surgeon: Jackquelyn Mass, MD;  Location: [facility] INVASIVE CV LAB;  Service: Cardiovascular;  Laterality: Right;   LOWER EXTREMITY ANGIOGRAPHY Right 03/23/2020   Procedure: Lower Extremity Angiography;  Surgeon: Jackquelyn Mass, MD;  Location: [facility] INVASIVE CV LAB;  Service: Cardiovascular;  Laterality: Right;   LOWER EXTREMITY ANGIOGRAPHY Right 09/25/2021   Procedure: Lower Extremity Angiography;  Surgeon: Jackquelyn Mass, MD;  Location: [facility] INVASIVE CV LAB;  Service: Cardiovascular;  Laterality: Right;   LOWER EXTREMITY ANGIOGRAPHY Right 09/26/2021   Procedure: Lower Extremity Angiography;  Surgeon: Celso College, MD;  Location: [facility] INVASIVE CV LAB;  Service: Cardiovascular;  Laterality: Right;   LOWER EXTREMITY ANGIOGRAPHY Right 04/08/2022   Procedure: Lower Extremity Angiography;  Surgeon: Jackquelyn Mass, MD;  Location: [facility] INVASIVE CV LAB;  Service: Cardiovascular;  Laterality: Right;   LOWER EXTREMITY INTERVENTION  11/18/2016   Procedure: Lower Extremity Intervention;  Surgeon: Jackquelyn Mass, MD;  Location: [facility] INVASIVE CV LAB;  Service: Cardiovascular;;   PERIPHERAL VASCULAR CATHETERIZATION Right 01/02/2015   Procedure: Lower  Extremity Angiography;  Surgeon: Jackquelyn Mass, MD;  Location: [facility] INVASIVE CV LAB;  Service: Cardiovascular;  Laterality: Right;   PERIPHERAL VASCULAR CATHETERIZATION Right 06/18/2015   Procedure: Lower Extremity Angiography;  Surgeon: Celso College, MD;  Location: [facility] INVASIVE CV LAB;  Service: Cardiovascular;  Laterality: Right;   PERIPHERAL VASCULAR CATHETERIZATION  06/18/2015   Procedure: Lower Extremity Intervention;  Surgeon: Celso College, MD;  Location: [facility] INVASIVE CV LAB;  Service: Cardiovascular;;   PERIPHERAL VASCULAR CATHETERIZATION N/A 10/09/2015   Procedure: Abdominal Aortogram w/Lower Extremity;  Surgeon: Jackquelyn Mass, MD;  Location: [facility] INVASIVE CV LAB;  Service: Cardiovascular;  Laterality: N/A;   PERIPHERAL VASCULAR CATHETERIZATION  10/09/2015   Procedure: Lower Extremity Intervention;  Surgeon: Jackquelyn Mass, MD;  Location: [facility] INVASIVE CV LAB;  Service: Cardiovascular;;   PERIPHERAL VASCULAR CATHETERIZATION Right 10/10/2015   Procedure: Lower Extremity Angiography;  Surgeon: Celso College, MD;  Location: [facility] INVASIVE CV LAB;  Service: Cardiovascular;  Laterality: Right;   PERIPHERAL VASCULAR CATHETERIZATION Right 10/10/2015   Procedure: Lower Extremity Intervention;  Surgeon: Celso College, MD;  Location: [facility] INVASIVE CV LAB;  Service: Cardiovascular;  Laterality: Right;   PERIPHERAL VASCULAR CATHETERIZATION Right 12/03/2015   Procedure: Lower Extremity Angiography;  Surgeon: Celso College, MD;  Location: [facility] INVASIVE  CV LAB;  Service: Cardiovascular;  Laterality: Right;   PERIPHERAL VASCULAR CATHETERIZATION Right 12/04/2015   Procedure: Lower Extremity Angiography;  Surgeon: Celso College, MD;  Location: [facility] INVASIVE CV LAB;  Service: Cardiovascular;  Laterality: Right;   PERIPHERAL VASCULAR CATHETERIZATION  12/04/2015   Procedure: Lower Extremity Intervention;  Surgeon: Celso College, MD;  Location: [facility] INVASIVE CV LAB;  Service: Cardiovascular;;   PERIPHERAL VASCULAR  CATHETERIZATION Right 06/30/2016   Procedure: Lower Extremity Angiography;  Surgeon: Celso College, MD;  Location: [facility] INVASIVE CV LAB;  Service: Cardiovascular;  Laterality: Right;   PERIPHERAL VASCULAR CATHETERIZATION Right 06/25/2016   Procedure: Lower Extremity Angiography;  Surgeon: Jackquelyn Mass, MD;  Location: [facility] INVASIVE CV LAB;  Service: Cardiovascular;  Laterality: Right;   PERIPHERAL VASCULAR CATHETERIZATION Right 07/01/2016   Procedure: Lower Extremity Angiography;  Surgeon: Jackquelyn Mass, MD;  Location: [facility] INVASIVE CV LAB;  Service: Cardiovascular;  Laterality: Right;   PERIPHERAL VASCULAR CATHETERIZATION  07/01/2016   Procedure: Lower Extremity Intervention;  Surgeon: Jackquelyn Mass, MD;  Location: [facility] INVASIVE CV LAB;  Service: Cardiovascular;;   PERIPHERAL VASCULAR CATHETERIZATION Right 08/01/2016   Procedure: Lower Extremity Angiography;  Surgeon: Jackquelyn Mass, MD;  Location: [facility] INVASIVE CV LAB;  Service: Cardiovascular;  Laterality: Right;   PORTA CATH INSERTION N/A 10/05/2020   Procedure: PORTA CATH INSERTION;  Surgeon: Jackquelyn Mass, MD;  Location: [facility] INVASIVE CV LAB;  Service: Cardiovascular;  Laterality: N/A;   stent placement in right leg Right    VIDEO BRONCHOSCOPY N/A 09/14/2020   Procedure: VIDEO BRONCHOSCOPY WITH FLUORO;  Surgeon: Marc Senior, MD;  Location: [facility] ORS;  Service: Cardiopulmonary;  Laterality: N/A;   VIDEO BRONCHOSCOPY WITH ENDOBRONCHIAL ULTRASOUND N/A 09/14/2020   Procedure: VIDEO BRONCHOSCOPY WITH ENDOBRONCHIAL ULTRASOUND;  Surgeon: Marc Senior, MD;  Location: [facility] ORS;  Service: Cardiopulmonary;  Laterality: N/A;    SOCIAL HISTORY: Social History   Socioeconomic History   Marital status: Single    Spouse name: Not on file   Number of children: Not on file   Years of education: Not on file   Highest education level: Not on file  Occupational History   Occupation: unemployed  Tobacco Use   Smoking status:  Former    Current packs/day: 0.00    Average packs/day: 2.0 packs/day for 38.0 years (76.0 ttl pk-yrs)    Types: Cigarettes    Start date: 12/19/1976    Quit date: 12/20/2014    Years since quitting: 8.9   Smokeless tobacco: Never   Tobacco comments:    quit smoking in 2017 most smoked 2   Vaping Use   Vaping status: Former  Substance and Sexual Activity   Alcohol use: Not Currently   Drug use: No   Sexual activity: Yes  Other Topics Concern   Not on file  Social History Narrative   Not on file   Social Drivers of Health   Financial Resource Strain: Low Risk  (07/27/2023)   Received from [facility]   Overall Financial Resource Strain (CARDIA)    Difficulty of Paying Living Expenses: Not hard at all  Food Insecurity: No Food Insecurity (08/21/2023)   Hunger Vital Sign    Worried About Running Out of Food in the Last Year: Never true    Ran Out of Food in the Last Year: Never true  Transportation Needs: No Transportation Needs (08/21/2023)   PRAPARE - Administrator, Civil Service (Medical): No  Lack of Transportation (Non-Medical): No  Physical Activity: Insufficiently Active (05/29/2023)   Exercise Vital Sign    Days of Exercise per Week: 2 days    Minutes of Exercise per Session: 10 min  Stress: No Stress Concern Present (05/29/2023)   [employer] of Occupational Health - Occupational Stress Questionnaire    Feeling of Stress : Not at all  Social Connections: Socially Isolated (08/21/2023)   Social Connection and Isolation Panel [NHANES]    Frequency of Communication with Friends and Family: More than three times a week    Frequency of Social Gatherings with Friends and Family: Three times a week    Attends Religious Services: Never    Active Member of Clubs or Organizations: No    Attends Banker Meetings: Never    Marital Status: Never married  Intimate Partner Violence: Not At Risk (08/21/2023)   Humiliation, Afraid, Rape,  and Kick questionnaire    Fear of Current or Ex-Partner: No    Emotionally Abused: No    Physically Abused: No    Sexually Abused: No    FAMILY HISTORY: Family History  Problem Relation Age of Onset   Diabetes Mother    Hypertension Mother    Heart murmur Mother    Leukemia Mother    Throat cancer Maternal Grandmother    Colon cancer Neg Hx    Breast cancer Neg Hx     ALLERGIES:  has no active allergies.  MEDICATIONS:  Current Outpatient Medications  Medication Sig Dispense Refill   aspirin  EC 81 MG tablet Take 81 mg by mouth daily. Swallow whole.     clopidogrel  (PLAVIX ) 75 MG tablet TAKE 1 TABLET BY MOUTH DAILY 100 tablet 2   dexamethasone  (DECADRON ) 1 MG tablet Take 3 tablets (3 mg total) by mouth daily with breakfast for 7 days, THEN 2 tablets (2 mg total) daily with breakfast for 7 days, THEN 1 tablet (1 mg total) daily with breakfast for 7 days. 42 tablet 0   levETIRAcetam  (KEPPRA ) 1000 MG tablet Take 1 tablet (1,000 mg total) by mouth 2 (two) times daily. 60 tablet 2   rosuvastatin  (CRESTOR ) 40 MG tablet Take 1 tablet (40 mg total) by mouth daily. 90 tablet 3   levothyroxine  (SYNTHROID ) 125 MCG tablet Take 1 tablet (125 mcg total) by mouth daily before breakfast. 90 tablet 0   No current facility-administered medications for this visit.   Facility-Administered Medications Ordered in Other Visits  Medication Dose Route Frequency Provider Last Rate Last Admin   albuterol  (PROVENTIL ) (2.5 MG/3ML) 0.083% nebulizer solution 2.5 mg  2.5 mg Nebulization Once Marc Senior, MD       heparin  lock flush 100 unit/mL  500 Units Intracatheter Once PRN [name], MD         PHYSICAL EXAMINATION: ECOG PERFORMANCE STATUS: 1 - Symptomatic but completely ambulatory Vitals:   12/02/23 1339 12/02/23 1351  BP: (!) 166/83 135/78  Pulse: 77   Temp: 98.4 F (36.9 C)   SpO2: 98%     Filed Weights   12/02/23 1339  Weight: 222 lb 3.2 oz (100.8 kg)     Physical  Exam Constitutional:      General: He is not in acute distress.    Appearance: He is obese.     Comments: Walks with a cane  HENT:     Head: Normocephalic and atraumatic.  Eyes:     General: No scleral icterus. Cardiovascular:     Rate and Rhythm: Normal rate  and regular rhythm.  Pulmonary:     Effort: Pulmonary effort is normal. No respiratory distress.     Breath sounds: Normal breath sounds. No wheezing.  Abdominal:     General: There is no distension.     Palpations: Abdomen is soft.     Tenderness: There is no abdominal tenderness.  Musculoskeletal:     Cervical back: Normal range of motion and neck supple.     Right lower leg: Edema present.  Skin:    General: Skin is warm and dry.     Findings: Bruising present. No erythema.  Neurological:     Mental Status: He is alert and oriented to person, place, and time. Mental status is at baseline.     Cranial Nerves: No cranial nerve deficit.     Motor: No abnormal muscle tone.  Psychiatric:        Mood and Affect: Mood and affect normal.      LABORATORY DATA:  I have reviewed the data as listed    Latest Ref Rng & Units 12/02/2023    1:01 PM 11/18/2023   12:49 PM 11/04/2023    8:50 AM  CBC  WBC 4.0 - 10.5 K/uL 6.8  7.3  11.4   Hemoglobin 13.0 - 17.0 g/dL 16.1  09.6  04.5   Hematocrit 39.0 - 52.0 % 35.7  37.9  40.9   Platelets 150 - 400 K/uL 149  118  172       Latest Ref Rng & Units 12/02/2023    1:01 PM 11/18/2023   12:49 PM 11/04/2023    8:50 AM  CMP  Glucose 70 - 99 mg/dL 98  409  811   BUN 8 - 23 mg/dL 15  23  25    Creatinine 0.61 - 1.24 mg/dL 9.14  7.82  9.56   Sodium 135 - 145 mmol/L 140  137  139   Potassium 3.5 - 5.1 mmol/L 3.8  4.3  4.1   Chloride 98 - 111 mmol/L 108  103  105   CO2 22 - 32 mmol/L 24  25  25    Calcium  8.9 - 10.3 mg/dL 8.2  8.6  8.6   Total Protein 6.5 - 8.1 g/dL 6.4  6.3  6.4   Total Bilirubin 0.0 - 1.2 mg/dL 0.4  0.8  0.9   Alkaline Phos 38 - 126 U/L 56  58  49   AST 15 - 41 U/L 67   42  21   ALT 0 - 44 U/L 118  99  58      RADIOGRAPHIC STUDIES: I have personally reviewed the radiological images as listed and agreed with the findings in the report. US  Venous Img Lower Unilateral Right (DVT) Result Date: 11/18/2023 CLINICAL DATA:  Right leg swelling EXAM: RIGHT LOWER EXTREMITY VENOUS DOPPLER ULTRASOUND TECHNIQUE: Gray-scale sonography with compression, as well as color and duplex ultrasound, were performed to evaluate the deep venous system(s) from the level of the common femoral vein through the popliteal and proximal calf veins. COMPARISON:  None Available. FINDINGS: VENOUS Normal compressibility of the common femoral, superficial femoral, and popliteal veins, as well as the visualized calf veins. Visualized portions of profunda femoral vein and great saphenous vein unremarkable. No filling defects to suggest DVT on grayscale or color Doppler imaging. Doppler waveforms show normal direction of venous flow, normal respiratory plasticity and response to augmentation. Limited views of the contralateral common femoral vein are unremarkable. OTHER Suspected occluded graft distal to common femoral artery,  as shown on CT scan of 10/14/2023. Limitations: none IMPRESSION: 1. No findings of right lower extremity DVT. 2. 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 If workup of the right lower extremity arterial system is indicated, a CT angiogram runoff study could be performed. Electronically Signed   By: Freida Jes M.D.   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 Status post chemoradiation. Enhancing lesions noted on brain MRI in 2023. Status post SRS. EXAM: MRI HEAD WITHOUT AND WITH CONTRAST TECHNIQUE: Multiplanar, multiecho pulse sequences of the  brain and surrounding structures were obtained without and with intravenous contrast. CONTRAST:  10mL GADAVIST  GADOBUTROL  1 MMOL/ML IV SOLN COMPARISON:  None Available. FINDINGS: Brain: 1.4 x 1.4 x 1.6 cm enhancing mass in the posterior aspect of the left frontal operculum previously measuring 1.8 x 1.6 x 2.2 cm when remeasured in a similar manner (series 1200 image 197, series 9 image 26). Additional centrally necrotic enhancing lesion in the left parietal lobe which measures 3.5 x 2.9 x 2.4 cm, previously 4.5 x 3.1 x 2.9 cm (series 1200, image 241). There is decreased enhancement along the inferior margin of the lesion. Centrally necrotic component appears slightly decreased in size but appears to occupy a greater portion of the lesion compared to prior. Similar enhancement along the superficial/superior margin of the lesion with similar extension to the dura. Redemonstration of extensive vasogenic edema in the left frontoparietal lobes which is overall decreased compared to the prior study with decreased gyral expansion. There is likely slightly decreased involvement of the left basal ganglia and external capsule. Approximately 4 mm rightward midline shift, previously 7 mm. 0.8 cm peripherally enhancing lesion in the right occipital lobe, previously measuring 1.0 cm. Surrounding vasogenic edema is similar to prior. There is a new linear focus of enhancement over the convexity of the left inferior parietal lobule which may reflect a non suppressed vessel (series 1200 image 210). No nodular or masslike enhancement in this region. Otherwise no new enhancing lesions noted. No acute infarct. Similar chronic microhemorrhage in the mid/posterior left cingulate gyrus. Similar chronic blood products associated with the left cerebral lesions. The basilar cisterns are patent. No extra-axial fluid collection. Vascular: Normal flow voids. Skull and upper cervical spine: Normal marrow signal. Sinuses/Orbits: Focal mucosal  thickening in the right posterior ethmoid air cells. Other: None. IMPRESSION: Compared to 10/09/2023 there is decreased size of the left frontal operculum lesion and left parietal lesion. Decreased vasogenic edema in the left frontoparietal lobes with decreased gyral expansion. 4 mm rightward midline shift, previously 7 mm. Similar appearance of right occipital lesion. Focal curvilinear enhancement over the convexity of the inferior left parietal lobe which may reflect a non suppressed vessel. Recommend attention on follow-up. Electronically Signed   By: Denny Flack M.D.   On: 11/09/2023 20:28   NM PET Metabolic Brain Result Date: 10/20/2023 CLINICAL DATA:  Dementia. 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 Full-ring PET imaging was performed from the vertex to skull base. CT data was obtained and used for attenuation correction and anatomic localization. Post processing fusion of PET data set with brain MRI 10/09/2023. FASTING BLOOD GLUCOSE:  Value: 88 mg/dl COMPARISON:  Brain MRI 10/09/2018 FINDINGS: Focus of  hypermetabolic activity in the posterior lateral LEFT frontal lobe which is slightly greater than cortical metabolism (image 58). This corresponds to enhancing lesion on comparison MRI. On coronal orientation the most intense portion the lesion along the craniad surface of the lesion. Thin rim of peripheral hypermetabolic activity associated with the high LEFT parietal enhancing lesion (image 24 through 31 PET data set). This rim of metabolic activity is slightly greater than adjacent normal cortical metabolic activity. IMPRESSION: Findings concerning for tumor recurrence within the LEFT posterior lateral frontal lobe and high LEFT parietal lobe. Electronically Signed   By: Deboraha Fallow M.D.   On: 10/20/2023 12:30   CT CHEST ABDOMEN PELVIS W CONTRAST Result Date: 10/18/2023 CLINICAL DATA:  Lung  cancer restaging * Tracking Code: BO * EXAM: CT CHEST, ABDOMEN, AND PELVIS WITH CONTRAST TECHNIQUE: Multidetector CT imaging of the chest, abdomen and pelvis was performed following the standard protocol during bolus administration of intravenous contrast. RADIATION DOSE REDUCTION: This exam was performed according to the departmental dose-optimization program which includes automated exposure control, adjustment of the mA and/or kV according to patient size and/or use of iterative reconstruction technique. CONTRAST:  OMNIPAQUE  IOHEXOL  300 MG/ML  SOLN COMPARISON:  06/25/2023 FINDINGS: CT CHEST FINDINGS Cardiovascular: Right chest port catheter. Scattered aortic atherosclerosis. Normal heart size. Scattered left and right coronary artery calcifications. No pericardial effusion. Mediastinum/Nodes: No enlarged mediastinal, hilar, or axillary lymph nodes. Thyroid  gland, trachea, and esophagus demonstrate no significant findings. Lungs/Pleura: Unchanged post treatment/post radiation appearance of the right lung with perihilar and paramedian fibrosis and consolidation. Minimal paraseptal emphysema. No pleural effusion or pneumothorax. Musculoskeletal: No chest wall abnormality. No acute osseous findings. CT ABDOMEN PELVIS FINDINGS Hepatobiliary: No solid liver abnormality is seen. Hepatic steatosis. No gallstones, gallbladder wall thickening, or biliary dilatation. Pancreas: Unremarkable. No pancreatic ductal dilatation or surrounding inflammatory changes. Spleen: Normal in size without significant abnormality. Adrenals/Urinary Tract: Adrenal glands are unremarkable. Kidneys are normal, without renal calculi, solid lesion, or hydronephrosis. Bladder is unremarkable. Stomach/Bowel: Stomach is within normal limits. Appendix appears normal. No evidence of bowel wall thickening, distention, or inflammatory changes. Sigmoid diverticulosis. Vascular/Lymphatic: Aortic atherosclerosis. Partially imaged right superficial  femoral and profunda system stenting. No enlarged abdominal or pelvic lymph nodes. Reproductive: No mass or other abnormality. Other: No abdominal wall hernia or abnormality. No ascites. Musculoskeletal: No acute osseous findings. IMPRESSION: 1. Unchanged post treatment/post radiation appearance of the right lung with perihilar and paramedian fibrosis and consolidation. 2. No evidence of lymphadenopathy or metastatic disease in the chest, abdomen, or pelvis. 3. Hepatic steatosis. 4. Coronary artery disease. Aortic Atherosclerosis (ICD10-I70.0) and Emphysema (ICD10-J43.9). Electronically Signed   By: Fredricka Jenny M.D.   On: 10/18/2023 08:12   MR BRAIN W WO CONTRAST Result Date: 10/09/2023 CLINICAL DATA:  Brain/CNS neoplasm, assess treatment response. History of metastatic lung cancer. EXAM: MRI HEAD WITHOUT AND WITH CONTRAST TECHNIQUE: Multiplanar, multiecho pulse sequences of the brain and surrounding structures were obtained without and with intravenous contrast. CONTRAST:  10mL GADAVIST  GADOBUTROL  1 MMOL/ML IV SOLN COMPARISON:  Head MRI 08/21/2023 FINDINGS: Brain: A heterogeneously enhancing, partially necrotic lesion in the posterior aspect of the left frontal operculum has an unchanged maximal oblique diameter of 2.2 cm on coronal images (series 16, image 18), however the lesion appears slightly larger on axial and sagittal images (for example, 1.8 cm AP diameter on axial series 15, image 108 compared to 1.5 cm previously). A partially necrotic lesion in the left parietal lobe demonstrates more substantial enlargement and thickness  of peripheral enhancement, now measuring up to 4.5 cm (series 15, image 144, previously 3 cm when measured in a similar fashion). This again extends to the dura. Extensive vasogenic edema in the left parietal and frontal lobes extending into the basal ganglia region has not significantly changed with unchanged 7 mm of rightward midline shift. A 1.0 cm peripherally enhancing lesion  in the right occipital lobe is unchanged in size with unchanged minimal surrounding edema (series 15, image 59). Persistent punctate enhancement at the site of a known left cerebellar lesion has increased in intensity but not substantially enlarged, and there is no associated edema (series 15, image 38). No new enhancing brain lesion, acute infarct, or extra-axial fluid collection is identified. The ventricles are unchanged in size without evidence of hydrocephalus. There are chronic blood products associated with the 2 left cerebral lesions, and there is also an unchanged focus of chronic microhemorrhage at the mid to posterior left cingulate gyrus. Vascular: Major intracranial vascular flow voids are preserved. Skull and upper cervical spine: No suspicious marrow lesion. Sinuses/Orbits: Unremarkable orbits. Mild posterior right ethmoid air cell opacification. No significant mastoid fluid. Other: None. IMPRESSION: 1. Slight enlargement of the lesion in the posterior left frontal operculum and more substantial enlargement of the left parietal lesion with unchanged extensive edema and 7 mm of rightward midline shift. 2. No evidence of new intracranial metastases. Electronically Signed   By: Aundra Lee M.D.   On: 10/09/2023 13:29

## 2023-12-03 ENCOUNTER — Other Ambulatory Visit: Payer: Self-pay

## 2023-12-03 ENCOUNTER — Telehealth: Payer: Self-pay | Admitting: *Deleted

## 2023-12-03 NOTE — Telephone Encounter (Signed)
 PC to patient, informed him of Dr Boris Byars advice below, he states he will discuss his concerns about avastin  at his upcoming appointment with Dr Wilhelmenia Harada.  He will also seek further care of his toe with PCP or Urgent Care if he feels like it is getting worse.

## 2023-12-03 NOTE — Telephone Encounter (Signed)
-----   Message from Mamie Searles sent at 12/03/2023  9:17 AM EDT ----- Dr Wilhelmenia Harada is managing the avastin  treatment plan locally in [place].  She will determine whether it is safe to administer.  If there is concern about the toe he should go to urgent care or podiatry. ----- Message ----- From: Priscille Brought, RN Sent: 12/03/2023   9:08 AM EDT To: Zachary K Vaslow, MD  This patient called this morning, he is concerned about his avastin  infusions & the side effects.  He said he hurt his toe in mid April, was afraid it might become infected & completed a round of abx.  He says it is still painful & slightly swollen.  He also C/O mouth dryness & soreness, intermittent chest pain & headaches.  He has an appointment with you June 6, but is scheduled for an infusion before that time.  Please advise.  Marily Shows

## 2023-12-04 ENCOUNTER — Other Ambulatory Visit: Payer: Self-pay

## 2023-12-06 ENCOUNTER — Encounter (INDEPENDENT_AMBULATORY_CARE_PROVIDER_SITE_OTHER): Payer: Self-pay | Admitting: Nurse Practitioner

## 2023-12-06 NOTE — Progress Notes (Signed)
 Subjective:    Patient ID: Joshua [name], male    DOB: 10/09/1955, 68 y.o.   MRN: [id number] Chief Complaint  Patient presents with   Follow-up    Consult for suspected occluded graft distal to common femoral artery shown on CT    The patient is a 68 year old male who presents today for evaluation after a recent CT scan showed occlusion of his right lower extremity bypass graft.  He notes that he does not have any worsening claudication symptoms.  Currently there are no open wounds or ulcerations.  This occlusion has been known for approximately 2 years.  We have been following the patient approximately every 6 months or so.    Review of Systems  Cardiovascular:  Negative for leg swelling.  Skin:  Negative for wound.  All other systems reviewed and are negative.      Objective:    Physical Exam Vitals reviewed.  HENT:     Head: Normocephalic.  Cardiovascular:     Rate and Rhythm: Normal rate.  Pulmonary:     Effort: Pulmonary effort is normal.  Skin:    General: Skin is warm and dry.  Neurological:     Mental Status: He is alert and oriented to person, place, and time.  Psychiatric:        Mood and Affect: Mood normal.        Behavior: Behavior normal.        Thought Content: Thought content normal.        Judgment: Judgment normal.     BP (!) 155/84   Pulse 88   Resp 18   Wt 218 lb (98.9 kg)   BMI 30.40 kg/m   Past Medical History:  Diagnosis Date   Allergy    Cancer (HCC)    lung   COPD (chronic obstructive pulmonary disease) (HCC)    Dyspnea    former smoker. 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   Malignant neoplasm metastatic to brain [facility])    Peripheral vascular disease (HCC)    Pre-diabetes    Sleep apnea     Social History   Socioeconomic History   Marital status: Single    Spouse name: Not on file   Number of children: Not on file   Years of education: Not on file    Highest education level: Not on file  Occupational History   Occupation: unemployed  Tobacco Use   Smoking status: Former    Current packs/day: 0.00    Average packs/day: 2.0 packs/day for 38.0 years (76.0 ttl pk-yrs)    Types: Cigarettes    Start date: 12/19/1976    Quit date: 12/20/2014    Years since quitting: 8.9   Smokeless tobacco: Never   Tobacco comments:    quit smoking in 2017 most smoked 2   Vaping Use   Vaping status: Former  Substance and Sexual Activity   Alcohol use: Not Currently   Drug use: No   Sexual activity: Yes  Other Topics Concern   Not on file  Social History Narrative   Not on file   Social Drivers of Health   Financial Resource Strain: Low Risk  (07/27/2023)   Received from [facility]   Overall Financial Resource Strain (CARDIA)    Difficulty of Paying Living Expenses: Not hard at all  Food Insecurity: No Food Insecurity (08/21/2023)   Hunger Vital Sign  Worried About Programme researcher, broadcasting/film/video in the Last Year: Never true    Ran Out of Food in the Last Year: Never true  Transportation Needs: No Transportation Needs (08/21/2023)   PRAPARE - Administrator, Civil Service (Medical): No    Lack of Transportation (Non-Medical): No  Physical Activity: Insufficiently Active (05/29/2023)   Exercise Vital Sign    Days of Exercise per Week: 2 days    Minutes of Exercise per Session: 10 min  Stress: No Stress Concern Present (05/29/2023)   [employer] of Occupational Health - Occupational Stress Questionnaire    Feeling of Stress : Not at all  Social Connections: Socially Isolated (08/21/2023)   Social Connection and Isolation Panel [NHANES]    Frequency of Communication with Friends and Family: More than three times a week    Frequency of Social Gatherings with Friends and Family: Three times a week    Attends Religious Services: Never    Active Member of Clubs or Organizations: No    Attends Banker Meetings:  Never    Marital Status: Never married  Intimate Partner Violence: Not At Risk (08/21/2023)   Humiliation, Afraid, Rape, and Kick questionnaire    Fear of Current or Ex-Partner: No    Emotionally Abused: No    Physically Abused: No    Sexually Abused: No    Past Surgical History:  Procedure Laterality Date   ANTERIOR CERVICAL DECOMP/DISCECTOMY FUSION N/A 03/14/2020   Procedure: ANTERIOR CERVICAL DECOMPRESSION/DISCECTOMY FUSION 3 LEVELS C4-7;  Surgeon: Jodeen Munch, MD;  Location: [facility] ORS;  Service: Neurosurgery;  Laterality: N/A;   BACK SURGERY  2011   CENTRAL LINE INSERTION Right 09/10/2016   Procedure: CENTRAL LINE INSERTION;  Surgeon: Jackquelyn Mass, MD;  Location: [facility];  Service: Vascular;  Laterality: Right;   CERVICAL FUSION     C4-c7 on Mar 14, 2020   COLONOSCOPY  2013 ?   COLONOSCOPY N/A 09/09/2021   Procedure: COLONOSCOPY;  Surgeon: Quintin Buckle, DO;  Location: [facility]COPY;  Service: Gastroenterology;  Laterality: N/A;   CORONARY ANGIOPLASTY     ESOPHAGOGASTRODUODENOSCOPY N/A 09/09/2021   Procedure: ESOPHAGOGASTRODUODENOSCOPY (EGD);  Surgeon: Quintin Buckle, DO;  Location: [facility] ENDOSCOPY;  Service: Gastroenterology;  Laterality: N/A;   FEMORAL-POPLITEAL BYPASS GRAFT Right 09/10/2016   Procedure: BYPASS GRAFT FEMORAL-POPLITEAL ARTERY ( BELOW KNEE );  Surgeon: Jackquelyn Mass, MD;  Location: [facility];  Service: Vascular;  Laterality: Right;   FEMORAL-TIBIAL BYPASS GRAFT Right 03/23/2020   Procedure: BYPASS GRAFT RIGHT FEMORAL- DISTAL TIBIAL ARTERY WITH DISTAL FLOW GRAFT;  Surgeon: Jackquelyn Mass, MD;  Location: [facility];  Service: Vascular;  Laterality: Right;   LEFT HEART CATH AND CORONARY ANGIOGRAPHY Left 05/16/2021   Procedure: LEFT HEART CATH AND CORONARY ANGIOGRAPHY;  Surgeon: Link Rice, MD;  Location: [facility] INVASIVE CV LAB;  Service: Cardiovascular;  Laterality: Left;   LOWER EXTREMITY ANGIOGRAPHY Right 11/18/2016   Procedure: Lower  Extremity Angiography;  Surgeon: Jackquelyn Mass, MD;  Location: [facility] INVASIVE CV LAB;  Service: Cardiovascular;  Laterality: Right;   LOWER EXTREMITY ANGIOGRAPHY Right 12/09/2016   Procedure: Lower Extremity Angiography;  Surgeon: Jackquelyn Mass, MD;  Location: [facility] INVASIVE CV LAB;  Service: Cardiovascular;  Laterality: Right;   LOWER EXTREMITY ANGIOGRAPHY Right 11/15/2019   Procedure: LOWER EXTREMITY ANGIOGRAPHY;  Surgeon: Jackquelyn Mass, MD;  Location: [facility] INVASIVE CV LAB;  Service: Cardiovascular;  Laterality: Right;   LOWER EXTREMITY ANGIOGRAPHY Right 12/01/2019   Procedure:  Lower Extremity Angiography;  Surgeon: Celso College, MD;  Location: [facility] INVASIVE CV LAB;  Service: Cardiovascular;  Laterality: Right;   LOWER EXTREMITY ANGIOGRAPHY Right 12/02/2019   Procedure: LOWER EXTREMITY ANGIOGRAPHY;  Surgeon: Jackquelyn Mass, MD;  Location: [facility] INV[facility] CV LAB;  Service: Cardiovascular;  Laterality: Right;   LOWER EXTREMITY ANGIOGRAPHY Right 03/23/2020   Procedure: Lower Extremity Angiography;  Surgeon: Jackquelyn Mass, MD;  Location: [facility] INV[facility] LAB;  Service: Cardiovascular;  Laterality: Right;   LOWER EXTREMITY ANGIOGRAPHY Right 09/25/2021   Procedure: Lower Extremity Angiography;  Surgeon: Jackquelyn Mass, MD;  Location: [facility] INV[facility] LAB;  Service: Cardiovascular;  Laterality: Right;   LOWER EXTREMITY ANGIOGRAPHY Right 09/26/2021   Procedure: Lower Extremity Angiography;  Surgeon: Celso College, MD;  Location: [facility] INV[facility] LAB;  Service: Cardiovascular;  Laterality: Right;   LOWER EXTREMITY ANGIOGRAPHY Right 04/08/2022   Procedure: Lower Extremity Angiography;  Surgeon: Jackquelyn Mass, MD;  Location: [facility] INV[facility]IVE CV LAB;  Service: Cardiovascular;  Laterality: Right;   LOWER EXTREMITY INTERVENTION  11/18/2016   Procedure: Lower Extremity Intervention;  Surgeon: Jackquelyn Mass, MD;  Location: [facility] INV[facility]IVE CV LAB;  Service: Cardiovascular;;   PERIPHERAL VASCULAR  CATHETERIZATION Right 01/02/2015   Procedure: Lower Extremity Angiography;  Surgeon: Jackquelyn Mass, MD;  Location: [facility] IN[facility] LAB;  Service: Cardiovascular;  Laterality: Right;   PERIPHERAL VASCULAR CATHETERIZATION Right 06/18/2015   Procedure: Lower Extremity Angiography;  Surgeon: Celso College, MD;  Location: [facility] LAB;  Service: Cardiovascular;  Laterality: Right;   PERIPHERAL VASCULAR CATHETERIZATION  06/18/2015   Procedure: Lower Extremity Intervention;  Surgeon: Celso College, MD;  Location: [facility];  Service: Cardiovascular;;   PERIPHERAL VASCULAR CATHETERIZATION N/A 10/09/2015   Procedure: Abdominal Aortogram w/Lower Extremity;  Surgeon: Jackquelyn Mass, MD;  Location: [facility];  Service: Cardiovascular;  Laterality: N/A;   PERIPHERAL VASCULAR CATHETERIZATION  10/09/2015   Procedure: Lower Extremity Intervention;  Surgeon: Jackquelyn Mass, MD;  Location: [facility];  Service: Cardiovascular;;   PERIPHERAL VASCULAR CATHETERIZATION Right 10/10/2015   Procedure: Lower Extremity Angiography;  Surgeon: Celso College, MD;  Location: [facility];  Service: Cardiovascular;  Laterality: Right;   PERIPHERAL VASCULAR CATHETERIZATION Right 10/10/2015   Procedure: Lower Extremity Intervention;  Surgeon: Celso College, MD;  Location: [facility];  Service: Cardiovascular;  Laterality: Right;   PERIPHERAL VASCULAR CATHETERIZATION Right 12/03/2015   Procedure: Lower Extremity Angiography;  Surgeon: Celso College, MD;  Location: [facility];  Service: Cardiovascular;  Laterality: Right;   PERIPHERAL VASCULAR CATHETERIZATION Right 12/04/2015   Procedure: Lower Extremity Angiography;  Surgeon: Celso College, MD;  Location: [facility];  Service: Cardiovascular;  Laterality: Right;   PERIPHERAL VASCULAR CATHETERIZATION  12/04/2015   Procedure: Lower Extremity Intervention;  Surgeon: Celso College, MD;  Location: [facility];   Service: Cardiovascular;;   PERIPHERAL VASCULAR CATHETERIZATION Right 06/30/2016   Procedure: Lower Extremity Angiography;  Surgeon: Celso College, MD;  Location: [facility];  Service: Cardiovascular;  Laterality: Right;   PERIPHERAL VASCULAR CATHETERIZATION Right 06/25/2016   Procedure: Lower Extremity Angiography;  Surgeon: Jackquelyn Mass, MD;  Location: [facility];  Service: Cardiovascular;  Laterality: Right;   PERIPHERAL VASCULAR CATHETERIZATION Right 07/01/2016   Procedure: Lower Extremity Angiography;  Surgeon: Jackquelyn Mass, MD;  Location: [facility];  Service: Cardiovascular;  Laterality:  Right;   PERIPHERAL VASCULAR CATHETERIZATION  07/01/2016   Procedure: Lower Extremity Intervention;  Surgeon: Jackquelyn Mass, MD;  Location: [facility];  Service: Cardiovascular;;   PERIPHERAL VASCULAR CATHETERIZATION Right 08/01/2016   Procedure: Lower Extremity Angiography;  Surgeon: Jackquelyn Mass, MD;  Location: [facility];  Service: Cardiovascular;  Laterality: Right;   PORTA CATH INSERTION N/A 10/05/2020   Procedure: PORTA CATH INSERTION;  Surgeon: Jackquelyn Mass, MD;  Location: [facility];  Service: Cardiovascular;  Laterality: N/A;   stent placement in right leg Right    VIDEO BRONCHOSCOPY N/A 09/14/2020   Procedure: VIDEO BRONCHOSCOPY WITH FLUORO;  Surgeon: Marc Senior, MD;  Location: [facility];  Service: Cardiopulmonary;  Laterality: N/A;   VIDEO BRONCHOSCOPY WITH ENDOBRONCHIAL ULTRASOUND N/A 09/14/2020   Procedure: VIDEO BRONCHOSCOPY WITH ENDOBRONCHIAL ULTRASOUND;  Surgeon: Marc Senior, MD;  Location: [facility];  Service: Cardiopulmonary;  Laterality: N/A;    Family History  Problem Relation Age of Onset   Diabetes Mother    Hypertension Mother    Heart murmur Mother    Leukemia Mother    Throat cancer Maternal Grandmother    Colon cancer Neg Hx    Breast cancer Neg Hx     No Active Allergies      Latest Ref Rng & Units 12/02/2023    1:01 PM 11/18/2023   12:49 PM 11/04/2023    8:50 AM  CBC  WBC 4.0 - 10.5 K/uL 6.8  7.3  11.4   Hemoglobin 13.0 - 17.0 g/dL 21.3  08.6  57.8   Hematocrit 39.0 - 52.0 % 35.7  37.9  40.9   Platelets 150 - 400 K/uL 149  118  172        CMP     Component Value Date/Time   NA 140 12/02/2023 1301   NA 138 06/02/2023 1049   K 3.8 12/02/2023 1301   CL 108 12/02/2023 1301   CO2 24 12/02/2023 1301   GLUCOSE 98 12/02/2023 1301   BUN 15 12/02/2023 1301   BUN 18 06/02/2023 1049   BUN 16 07/03/2014 1205   CREATININE 0.90 12/02/2023 1301   CREATININE 1.11 11/06/2022 1041   CREATININE 1.34 (H) 07/03/2014 1205   CALCIUM  8.2 (L) 12/02/2023 1301   PROT 6.4 (L) 12/02/2023 1301   PROT 7.0 04/16/2020 1525   ALBUMIN  3.4 (L) 12/02/2023 1301   ALBUMIN  4.3 04/16/2020 1525   AST 67 (H) 12/02/2023 1301   ALT 118 (H) 12/02/2023 1301   ALKPHOS 56 12/02/2023 1301   BILITOT 0.4 12/02/2023 1301   BILITOT 0.4 04/16/2020 1525   EGFR 83 06/02/2023 1049   GFRNONAA >60 12/02/2023 1301   GFRNONAA >60 11/06/2022 1041   GFRNONAA 59 (L) 07/03/2014 1205     No results found.     Assessment & Plan:   1. Peripheral arterial disease with history of revascularization (HCC) (Primary) The patient's right bypass graft has been occluded for approximately 2 years.  Based on that there is no emergency for revascularization.  We have been following at this time.  He is scheduled for follow-up with noninvasive studies next month.  He is not having any worsening symptoms.  2. Mixed hyperlipidemia Continue statin as ordered and reviewed, no changes at this time  3. Primary hypertension Continue antihypertensive medications as already ordered, these medications have been reviewed and there are no changes at this time.   Current Outpatient Medications on File Prior to Visit  Medication  Sig Dispense Refill   aspirin  EC 81 MG tablet Take 81 mg by mouth daily. Swallow whole.      clopidogrel  (PLAVIX ) 75 MG tablet TAKE 1 TABLET BY MOUTH DAILY 100 tablet 2   levETIRAcetam  (KEPPRA ) 1000 MG tablet Take 1 tablet (1,000 mg total) by mouth 2 (two) times daily. 60 tablet 2   levothyroxine  (SYNTHROID ) 125 MCG tablet Take 1 tablet (125 mcg total) by mouth daily before breakfast. 90 tablet 0   rosuvastatin  (CRESTOR ) 40 MG tablet Take 1 tablet (40 mg total) by mouth daily. 90 tablet 3   Current Facility-Administered Medications on File Prior to Visit  Medication Dose Route Frequency Provider Last Rate Last Admin   albuterol  (PROVENTIL ) (2.5 MG/3ML) 0.083% nebulizer solution 2.5 mg  2.5 mg Nebulization Once Marc Senior, MD       heparin  lock flush 100 unit/mL  500 Units Intracatheter Once PRN [name], MD        There are no Patient Instructions on file for this visit. No follow-ups on file.   Armonie Mettler E Meridith Romick, NP

## 2023-12-07 ENCOUNTER — Telehealth: Payer: Self-pay | Admitting: Radiation Therapy

## 2023-12-07 ENCOUNTER — Inpatient Hospital Stay: Admitting: Internal Medicine

## 2023-12-07 DIAGNOSIS — R569 Unspecified convulsions: Secondary | ICD-10-CM

## 2023-12-07 DIAGNOSIS — C7931 Secondary malignant neoplasm of brain: Secondary | ICD-10-CM

## 2023-12-07 NOTE — Progress Notes (Signed)
 I connected with Joshua [name] on 12/07/23 at  2:00 PM EDT by telephone visit and verified that I am speaking with the correct person using two identifiers.  I discussed the limitations, risks, security and privacy concerns of performing an evaluation and management service by telemedicine and the availability of in-person appointments. I also discussed with the patient that there may be a patient responsible charge related to this service. The patient expressed understanding and agreed to proceed.   Other persons participating in the visit and their role in the encounter:  n/a  Patient's location:  Home Provider's location:  Office  Chief Complaint:  Malignant neoplasm metastatic to brain [facility])  Seizure (HCC)  History of Present Ilness: Joshua [name] reports some pain in his right ankle and foot.  He completed the radiation therapy, has had several avastin  infusions, otherwise without issue.  Remains off decadron .  Continues on Keppra , no further seizures, but mood symptoms persist.  Observations: Language and cognition at baseline   Assessment and Plan: Malignant neoplasm metastatic to brain [facility])  Seizure (HCC)  Joshua [name] is clinically stable today.  He is doing well with avastin  overall; we do not attribute his foot and ankle issues described today to avastin  infusions.    We look forward to seeing him in 2-3 weeks following MRI brain.   Prior: Case was discussed in CNS tumor board this morning.  Discussed and recommended salvage radiosurgery to both lesions, with fractionation.  Given high risk of radiation necrosis, possibly already concurrent with present syndrome, recommended concurrent avastin , IV 7.5-10mg /kg q2-3 weeks.  This could be added to his existing immunotherapy scheduled with Dr. Wilhelmenia [name].    We reviewed potential side effects, including hypertension, bleeding/clotting, wound healing impairment.   He is agreeable with proceeding with the above plan.     We will touch base with shortly following first infusion to begin tapering the decadron .    Follow Up Instructions:   We ask that Joshua [name] return to clinic in 2 weeks following next brain MRI, or sooner as needed.  I discussed the assessment and treatment plan with the patient.  The patient was provided an opportunity to ask questions and all were answered.  The patient agreed with the plan and demonstrated understanding of the instructions.    The patient was advised to call back or seek an in-person evaluation if the symptoms worsen or if the condition fails to improve as anticipated.    Joshua Stanislaw K Remee Charley, MD   I provided 20 minutes of non face-to-face telephone visit time during this encounter, and > 50% was spent counseling as documented under my assessment & plan.

## 2023-12-07 NOTE — Telephone Encounter (Signed)
 I called Joshua [name] to check in on him after the completion of his brain SRT. He has no complaints or concerns from a CNS/post radiation standpoint. However, he asked about the possibility of taking a break from Avastin  because he has experienced an increase in aches and pains since the start of taking this medication.  I was not able to answer all his questions about how long Dr. Mark Sil planned for him to take it or if it is known for causing body aches and pains. He was also complaining of continued swelling and discomfort with his foot. I encouraged him to reach out to his PCP about this.   I made Davee Erm RN aware of this encounter to address the questions Wandalee Gust has surrounding Avastin . She is setting up a telephone visit with Dr. Mark Sil to reach out to the patient later today.   Axel Bohr R.T.(R)(T) Radiation Special Procedures Lead

## 2023-12-08 ENCOUNTER — Other Ambulatory Visit: Payer: Self-pay

## 2023-12-08 ENCOUNTER — Encounter (INDEPENDENT_AMBULATORY_CARE_PROVIDER_SITE_OTHER): Payer: Self-pay

## 2023-12-08 DIAGNOSIS — L03115 Cellulitis of right lower limb: Secondary | ICD-10-CM | POA: Diagnosis not present

## 2023-12-08 DIAGNOSIS — C7931 Secondary malignant neoplasm of brain: Secondary | ICD-10-CM | POA: Diagnosis not present

## 2023-12-08 DIAGNOSIS — I739 Peripheral vascular disease, unspecified: Secondary | ICD-10-CM | POA: Diagnosis not present

## 2023-12-08 MED ORDER — CEPHALEXIN 500 MG PO CAPS
500.0000 mg | ORAL_CAPSULE | Freq: Four times a day (QID) | ORAL | 0 refills | Status: DC
  Filled 2023-12-08: qty 40, 10d supply, fill #0

## 2023-12-16 ENCOUNTER — Inpatient Hospital Stay: Admitting: Oncology

## 2023-12-16 ENCOUNTER — Telehealth: Payer: Self-pay | Admitting: Oncology

## 2023-12-16 ENCOUNTER — Inpatient Hospital Stay

## 2023-12-16 ENCOUNTER — Other Ambulatory Visit: Payer: Self-pay | Admitting: Oncology

## 2023-12-16 NOTE — Telephone Encounter (Signed)
 Patient called to cancel today's appointment- he has a cold.  He would like to try to rescheduled to  Maybe next week. Any day/time will be fine.  Appointments cancelled as requested.  He said his foot is getting worse and he may go to the ER.

## 2023-12-17 ENCOUNTER — Emergency Department

## 2023-12-17 ENCOUNTER — Other Ambulatory Visit: Payer: Self-pay

## 2023-12-17 ENCOUNTER — Inpatient Hospital Stay
Admission: EM | Admit: 2023-12-17 | Discharge: 2023-12-22 | DRG: 603 | Disposition: A | Discharge status: Home / Self Care | Attending: Internal Medicine | Admitting: Internal Medicine

## 2023-12-17 DIAGNOSIS — Z981 Arthrodesis status: Secondary | ICD-10-CM | POA: Diagnosis not present

## 2023-12-17 DIAGNOSIS — Z808 Family history of malignant neoplasm of other organs or systems: Secondary | ICD-10-CM

## 2023-12-17 DIAGNOSIS — I739 Peripheral vascular disease, unspecified: Secondary | ICD-10-CM | POA: Diagnosis not present

## 2023-12-17 DIAGNOSIS — G40909 Epilepsy, unspecified, not intractable, without status epilepticus: Secondary | ICD-10-CM

## 2023-12-17 DIAGNOSIS — Z7982 Long term (current) use of aspirin: Secondary | ICD-10-CM

## 2023-12-17 DIAGNOSIS — Z9861 Coronary angioplasty status: Secondary | ICD-10-CM | POA: Diagnosis not present

## 2023-12-17 DIAGNOSIS — D630 Anemia in neoplastic disease: Secondary | ICD-10-CM | POA: Diagnosis present

## 2023-12-17 DIAGNOSIS — E785 Hyperlipidemia, unspecified: Secondary | ICD-10-CM | POA: Insufficient documentation

## 2023-12-17 DIAGNOSIS — Z79890 Hormone replacement therapy (postmenopausal): Secondary | ICD-10-CM

## 2023-12-17 DIAGNOSIS — Z833 Family history of diabetes mellitus: Secondary | ICD-10-CM | POA: Diagnosis not present

## 2023-12-17 DIAGNOSIS — M7731 Calcaneal spur, right foot: Secondary | ICD-10-CM | POA: Diagnosis not present

## 2023-12-17 DIAGNOSIS — Z87442 Personal history of urinary calculi: Secondary | ICD-10-CM | POA: Diagnosis not present

## 2023-12-17 DIAGNOSIS — L03115 Cellulitis of right lower limb: Secondary | ICD-10-CM | POA: Diagnosis not present

## 2023-12-17 DIAGNOSIS — E876 Hypokalemia: Secondary | ICD-10-CM | POA: Diagnosis present

## 2023-12-17 DIAGNOSIS — R000 Tachycardia, unspecified: Secondary | ICD-10-CM | POA: Diagnosis not present

## 2023-12-17 DIAGNOSIS — A419 Sepsis, unspecified organism: Secondary | ICD-10-CM

## 2023-12-17 DIAGNOSIS — Z87891 Personal history of nicotine dependence: Secondary | ICD-10-CM | POA: Diagnosis not present

## 2023-12-17 DIAGNOSIS — G4733 Obstructive sleep apnea (adult) (pediatric): Secondary | ICD-10-CM | POA: Diagnosis present

## 2023-12-17 DIAGNOSIS — Z8249 Family history of ischemic heart disease and other diseases of the circulatory system: Secondary | ICD-10-CM

## 2023-12-17 DIAGNOSIS — Z79899 Other long term (current) drug therapy: Secondary | ICD-10-CM

## 2023-12-17 DIAGNOSIS — J449 Chronic obstructive pulmonary disease, unspecified: Secondary | ICD-10-CM | POA: Diagnosis not present

## 2023-12-17 DIAGNOSIS — M79671 Pain in right foot: Secondary | ICD-10-CM | POA: Diagnosis not present

## 2023-12-17 DIAGNOSIS — M19071 Primary osteoarthritis, right ankle and foot: Secondary | ICD-10-CM | POA: Diagnosis not present

## 2023-12-17 DIAGNOSIS — Z7902 Long term (current) use of antithrombotics/antiplatelets: Secondary | ICD-10-CM

## 2023-12-17 DIAGNOSIS — I10 Essential (primary) hypertension: Secondary | ICD-10-CM | POA: Diagnosis not present

## 2023-12-17 DIAGNOSIS — C7931 Secondary malignant neoplasm of brain: Secondary | ICD-10-CM | POA: Diagnosis present

## 2023-12-17 DIAGNOSIS — H5789 Other specified disorders of eye and adnexa: Secondary | ICD-10-CM | POA: Diagnosis present

## 2023-12-17 DIAGNOSIS — M7989 Other specified soft tissue disorders: Secondary | ICD-10-CM | POA: Diagnosis not present

## 2023-12-17 DIAGNOSIS — Z85118 Personal history of other malignant neoplasm of bronchus and lung: Secondary | ICD-10-CM

## 2023-12-17 DIAGNOSIS — Z806 Family history of leukemia: Secondary | ICD-10-CM | POA: Diagnosis not present

## 2023-12-17 DIAGNOSIS — Z1152 Encounter for screening for COVID-19: Secondary | ICD-10-CM | POA: Diagnosis not present

## 2023-12-17 DIAGNOSIS — E039 Hypothyroidism, unspecified: Secondary | ICD-10-CM | POA: Diagnosis not present

## 2023-12-17 DIAGNOSIS — R7303 Prediabetes: Secondary | ICD-10-CM | POA: Diagnosis not present

## 2023-12-17 DIAGNOSIS — R5381 Other malaise: Secondary | ICD-10-CM | POA: Diagnosis present

## 2023-12-17 DIAGNOSIS — Z0389 Encounter for observation for other suspected diseases and conditions ruled out: Secondary | ICD-10-CM | POA: Diagnosis not present

## 2023-12-17 LAB — CBC WITH DIFFERENTIAL/PLATELET
Abs Immature Granulocytes: 0.02 10*3/uL (ref 0.00–0.07)
Basophils Absolute: 0 10*3/uL (ref 0.0–0.1)
Basophils Relative: 1 %
Eosinophils Absolute: 0.1 10*3/uL (ref 0.0–0.5)
Eosinophils Relative: 1 %
HCT: 40 % (ref 39.0–52.0)
Hemoglobin: 12.9 g/dL — ABNORMAL LOW (ref 13.0–17.0)
Immature Granulocytes: 0 %
Lymphocytes Relative: 11 %
Lymphs Abs: 0.7 10*3/uL (ref 0.7–4.0)
MCH: 28.7 pg (ref 26.0–34.0)
MCHC: 32.3 g/dL (ref 30.0–36.0)
MCV: 88.9 fL (ref 80.0–100.0)
Monocytes Absolute: 0.7 10*3/uL (ref 0.1–1.0)
Monocytes Relative: 11 %
Neutro Abs: 4.7 10*3/uL (ref 1.7–7.7)
Neutrophils Relative %: 76 %
Platelets: 282 10*3/uL (ref 150–400)
RBC: 4.5 MIL/uL (ref 4.22–5.81)
RDW: 16.2 % — ABNORMAL HIGH (ref 11.5–15.5)
WBC: 6.2 10*3/uL (ref 4.0–10.5)
nRBC: 0 % (ref 0.0–0.2)

## 2023-12-17 LAB — COMPREHENSIVE METABOLIC PANEL WITH GFR
ALT: 23 U/L (ref 0–44)
AST: 33 U/L (ref 15–41)
Albumin: 3.6 g/dL (ref 3.5–5.0)
Alkaline Phosphatase: 72 U/L (ref 38–126)
Anion gap: 16 — ABNORMAL HIGH (ref 5–15)
BUN: 10 mg/dL (ref 8–23)
CO2: 21 mmol/L — ABNORMAL LOW (ref 22–32)
Calcium: 8.9 mg/dL (ref 8.9–10.3)
Chloride: 99 mmol/L (ref 98–111)
Creatinine, Ser: 1.26 mg/dL — ABNORMAL HIGH (ref 0.61–1.24)
GFR, Estimated: >60 mL/min (ref >60)
Glucose, Bld: 110 mg/dL — ABNORMAL HIGH (ref 70–99)
Potassium: 3.1 mmol/L — ABNORMAL LOW (ref 3.5–5.1)
Sodium: 136 mmol/L (ref 135–145)
Total Bilirubin: 1.4 mg/dL — ABNORMAL HIGH (ref 0.0–1.2)
Total Protein: 7.5 g/dL (ref 6.5–8.1)

## 2023-12-17 LAB — PROTIME-INR
INR: 1.1 (ref 0.8–1.2)
Prothrombin Time: 14.4 s (ref 11.4–15.2)

## 2023-12-17 LAB — LACTIC ACID, PLASMA: Lactic Acid, Venous: 3.1 mmol/L (ref 0.5–1.9)

## 2023-12-17 MED ORDER — ONDANSETRON HCL 4 MG/2ML IJ SOLN
4.0000 mg | Freq: Once | INTRAMUSCULAR | Status: AC
  Administered 2023-12-18: 4 mg via INTRAVENOUS
  Filled 2023-12-17: qty 2

## 2023-12-17 MED ORDER — LACTATED RINGERS IV BOLUS (SEPSIS)
1000.0000 mL | Freq: Once | INTRAVENOUS | Status: AC
  Administered 2023-12-17: 1000 mL via INTRAVENOUS

## 2023-12-17 MED ORDER — VANCOMYCIN HCL IN DEXTROSE 1-5 GM/200ML-% IV SOLN
1000.0000 mg | Freq: Once | INTRAVENOUS | Status: AC
Start: 2023-12-17 — End: 2023-12-18
  Administered 2023-12-18: 1000 mg via INTRAVENOUS
  Filled 2023-12-17: qty 200

## 2023-12-17 MED ORDER — SODIUM CHLORIDE 0.9 % IV SOLN
2.0000 g | Freq: Once | INTRAVENOUS | Status: AC
  Administered 2023-12-18: 2 g via INTRAVENOUS
  Filled 2023-12-17: qty 20

## 2023-12-17 MED ORDER — FENTANYL CITRATE PF 50 MCG/ML IJ SOSY
50.0000 ug | PREFILLED_SYRINGE | Freq: Once | INTRAMUSCULAR | Status: AC
  Administered 2023-12-18: 50 ug via INTRAVENOUS
  Filled 2023-12-17: qty 1

## 2023-12-17 NOTE — ED Provider Notes (Signed)
 [facility] Provider Note    Event Date/Time   First MD Initiated Contact with Patient 12/17/23 2259     (approximate)   History   Wound Infection   HPI  Joshua [name] is a 68 y.o. 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  Has been on Keflex without relief.  Feels like symptoms are worsening.  No known fevers.  About a month ago he had a negative venous Doppler of this leg.  Toe has been draining pus.   History provided by patient.    Past Medical History:  Diagnosis Date   Allergy    Cancer (HCC)    lung   COPD (chronic obstructive pulmonary disease) (HCC)    Dyspnea    former smoker. 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   Malignant neoplasm metastatic to brain [facility])    Peripheral vascular disease (HCC)    Pre-diabetes    Sleep apnea     Past Surgical History:  Procedure Laterality Date   ANTERIOR CERVICAL DECOMP/DISCECTOMY FUSION N/A 03/14/2020   Procedure: ANTERIOR CERVICAL DECOMPRESSION/DISCECTOMY FUSION 3 LEVELS C4-7;  Surgeon: Jodeen Munch, MD;  Location: [facility] ORS;  Service: Neurosurgery;  Laterality: N/A;   BACK SURGERY  2011   CENTRAL LINE INSERTION Right 09/10/2016   Procedure: CENTRAL LINE INSERTION;  Surgeon: Jackquelyn Mass, MD;  Location: [facility] ORS;  Service: Vascular;  Laterality: Right;   CERVICAL FUSION     C4-c7 on Mar 14, 2020   COLONOSCOPY  2013 ?   COLONOSCOPY N/A 09/09/2021   Procedure: COLONOSCOPY;  Surgeon: Quintin Buckle, DO;  Location: [facility] ENDOSCOPY;  Service: Gastroenterology;  Laterality: N/A;   CORONARY ANGIOPLASTY     ESOPHAGOGASTRODUODENOSCOPY N/A 09/09/2021   Procedure: ESOPHAGOGASTRODUODENOSCOPY (EGD);  Surgeon: Quintin Buckle, DO;  Location: [facility]  ENDOSCOPY;  Service: Gastroenterology;  Laterality: N/A;   FEMORAL-POPLITEAL BYPASS GRAFT Right 09/10/2016   Procedure: BYPASS GRAFT FEMORAL-POPLITEAL ARTERY ( BELOW KNEE );  Surgeon: Jackquelyn Mass, MD;  Location: [facility] ORS;  Service: Vascular;  Laterality: Right;   FEMORAL-TIBIAL BYPASS GRAFT Right 03/23/2020   Procedure: BYPASS GRAFT RIGHT FEMORAL- DISTAL TIBIAL ARTERY WITH DISTAL FLOW GRAFT;  Surgeon: Jackquelyn Mass, MD;  Location: [facility] ORS;  Service: Vascular;  Laterality: Right;   LEFT HEART CATH AND CORONARY ANGIOGRAPHY Left 05/16/2021   Procedure: LEFT HEART CATH AND CORONARY ANGIOGRAPHY;  Surgeon: Link Rice, MD;  Location: [facility] INVASIVE CV LAB;  Service: Cardiovascular;  Laterality: Left;   LOWER EXTREMITY ANGIOGRAPHY Right 11/18/2016   Procedure: Lower Extremity Angiography;  Surgeon: Jackquelyn Mass, MD;  Location: [facility] INVASIVE CV LAB;  Service: Cardiovascular;  Laterality: Right;   LOWER EXTREMITY ANGIOGRAPHY Right 12/09/2016   Procedure: Lower Extremity Angiography;  Surgeon: Jackquelyn Mass, MD;  Location: [facility] INVASIVE CV LAB;  Service: Cardiovascular;  Laterality: Right;   LOWER EXTREMITY ANGIOGRAPHY Right 11/15/2019   Procedure: LOWER EXTREMITY ANGIOGRAPHY;  Surgeon: Jackquelyn Mass, MD;  Location: [facility] INVASIVE CV LAB;  Service: Cardiovascular;  Laterality: Right;   LOWER EXTREMITY ANGIOGRAPHY Right 12/01/2019   Procedure: Lower Extremity Angiography;  Surgeon: Celso College, MD;  Location: [facility] INVASIVE CV LAB;  Service: Cardiovascular;  Laterality: Right;   LOWER EXTREMITY ANGIOGRAPHY Right 12/02/2019   Procedure: LOWER EXTREMITY ANGIOGRAPHY;  Surgeon: Jackquelyn Mass, MD;  Location: [facility] INVASIVE CV LAB;  Service: Cardiovascular;  Laterality: Right;   LOWER EXTREMITY ANGIOGRAPHY Right 03/23/2020   Procedure: Lower Extremity Angiography;  Surgeon: Jackquelyn Mass, MD;  Location: [facility] INVASIVE CV LAB;  Service: Cardiovascular;  Laterality: Right;   LOWER  EXTREMITY ANGIOGRAPHY Right 09/25/2021   Procedure: Lower Extremity Angiography;  Surgeon: Jackquelyn Mass, MD;  Location: [facility] INVASIVE CV LAB;  Service: Cardiovascular;  Laterality: Right;   LOWER EXTREMITY ANGIOGRAPHY Right 09/26/2021   Procedure: Lower Extremity Angiography;  Surgeon: Celso College, MD;  Location: [facility] INVASIVE CV LAB;  Service: Cardiovascular;  Laterality: Right;   LOWER EXTREMITY ANGIOGRAPHY Right 04/08/2022   Procedure: Lower Extremity Angiography;  Surgeon: Jackquelyn Mass, MD;  Location: [facility] INVASIVE CV LAB;  Service: Cardiovascular;  Laterality: Right;   LOWER EXTREMITY INTERVENTION  11/18/2016   Procedure: Lower Extremity Intervention;  Surgeon: Jackquelyn Mass, MD;  Location: [facility] INVASIVE CV LAB;  Service: Cardiovascular;;   PERIPHERAL VASCULAR CATHETERIZATION Right 01/02/2015   Procedure: Lower Extremity Angiography;  Surgeon: Jackquelyn Mass, MD;  Location: [facility] INVASIVE CV LAB;  Service: Cardiovascular;  Laterality: Right;   PERIPHERAL VASCULAR CATHETERIZATION Right 06/18/2015   Procedure: Lower Extremity Angiography;  Surgeon: Celso College, MD;  Location: [facility] INVASIVE CV LAB;  Service: Cardiovascular;  Laterality: Right;   PERIPHERAL VASCULAR CATHETERIZATION  06/18/2015   Procedure: Lower Extremity Intervention;  Surgeon: Celso College, MD;  Location: [facility] INVASIVE CV LAB;  Service: Cardiovascular;;   PERIPHERAL VASCULAR CATHETERIZATION N/A 10/09/2015   Procedure: Abdominal Aortogram w/Lower Extremity;  Surgeon: Jackquelyn Mass, MD;  Location: [facility] INVASIVE CV LAB;  Service: Cardiovascular;  Laterality: N/A;   PERIPHERAL VASCULAR CATHETERIZATION  10/09/2015   Procedure: Lower Extremity Intervention;  Surgeon: Jackquelyn Mass, MD;  Location: [facility] INVASIVE CV LAB;  Service: Cardiovascular;;   PERIPHERAL VASCULAR CATHETERIZATION Right 10/10/2015   Procedure: Lower Extremity Angiography;  Surgeon: Celso College, MD;  Location: [facility] INVASIVE CV LAB;  Service:  Cardiovascular;  Laterality: Right;   PERIPHERAL VASCULAR CATHETERIZATION Right 10/10/2015   Procedure: Lower Extremity Intervention;  Surgeon: Celso College, MD;  Location: [facility] INVASIVE CV LAB;  Service: Cardiovascular;  Laterality: Right;   PERIPHERAL VASCULAR CATHETERIZATION Right 12/03/2015   Procedure: Lower Extremity Angiography;  Surgeon: Celso College, MD;  Location: [facility] INVASIVE CV LAB;  Service: Cardiovascular;  Laterality: Right;   PERIPHERAL VASCULAR CATHETERIZATION Right 12/04/2015   Procedure: Lower Extremity Angiography;  Surgeon: Celso College, MD;  Location: [facility] INVASIVE CV LAB;  Service: Cardiovascular;  Laterality: Right;   PERIPHERAL VASCULAR CATHETERIZATION  12/04/2015   Procedure: Lower Extremity Intervention;  Surgeon: Celso College, MD;  Location: [facility] INVASIVE CV LAB;  Service: Cardiovascular;;   PERIPHERAL VASCULAR CATHETERIZATION Right 06/30/2016   Procedure: Lower Extremity Angiography;  Surgeon: Celso College, MD;  Location: [facility] INVASIVE CV LAB;  Service: Cardiovascular;  Laterality: Right;   PERIPHERAL VASCULAR CATHETERIZATION Right 06/25/2016   Procedure: Lower Extremity Angiography;  Surgeon: Jackquelyn Mass, MD;  Location: [facility] INVASIVE CV LAB;  Service: Cardiovascular;  Laterality: Right;   PERIPHERAL VASCULAR CATHETERIZATION Right 07/01/2016   Procedure: Lower Extremity Angiography;  Surgeon: Jackquelyn Mass, MD;  Location: [facility] INVASIVE CV LAB;  Service: Cardiovascular;  Laterality: Right;   PERIPHERAL VASCULAR CATHETERIZATION  07/01/2016   Procedure: Lower Extremity Intervention;  Surgeon: Jackquelyn Mass, MD;  Location: [facility] INVASIVE CV LAB;  Service: Cardiovascular;;   PERIPHERAL VASCULAR CATHETERIZATION Right  08/01/2016   Procedure: Lower Extremity Angiography;  Surgeon: Jackquelyn Mass, MD;  Location: [facility] INVASIVE CV LAB;  Service: Cardiovascular;  Laterality: Right;   PORTA CATH INSERTION N/A 10/05/2020   Procedure: PORTA CATH INSERTION;  Surgeon: Jackquelyn Mass, MD;  Location: [facility] INVASIVE CV LAB;  Service: Cardiovascular;  Laterality: N/A;   stent placement in right leg Right    VIDEO BRONCHOSCOPY N/A 09/14/2020   Procedure: VIDEO BRONCHOSCOPY WITH FLUORO;  Surgeon: Marc Senior, MD;  Location: [facility] ORS;  Service: Cardiopulmonary;  Laterality: N/A;   VIDEO BRONCHOSCOPY WITH ENDOBRONCHIAL ULTRASOUND N/A 09/14/2020   Procedure: VIDEO BRONCHOSCOPY WITH ENDOBRONCHIAL ULTRASOUND;  Surgeon: Marc Senior, MD;  Location: [facility] ORS;  Service: Cardiopulmonary;  Laterality: N/A;    MEDICATIONS:  Prior to Admission medications   Medication Sig Start Date End Date Taking? Authorizing Provider  aspirin  EC 81 MG tablet Take 81 mg by mouth daily. Swallow whole.    [provider]  cephALEXin (KEFLEX) 500 MG capsule Take 1 capsule (500 mg total) by mouth 4 (four) times daily for 10 days 12/08/23     clopidogrel  (PLAVIX ) 75 MG tablet TAKE 1 TABLET BY MOUTH DAILY 05/11/23   Brown, Fallon E, NP  levETIRAcetam  (KEPPRA ) 1000 MG tablet Take 1 tablet (1,000 mg total) by mouth 2 (two) times daily. 11/26/23   Vaslow, Zachary K, MD  levothyroxine  (SYNTHROID ) 125 MCG tablet Take 1 tablet (125 mcg total) by mouth daily before breakfast. 12/02/23   [name], MD  rosuvastatin  (CRESTOR ) 40 MG tablet Take 1 tablet (40 mg total) by mouth daily. 06/03/23   Normie Becton, FNP    Physical Exam   Triage Vital Signs: ED Triage Vitals  Encounter Vitals Group     BP 12/17/23 1952 125/62     Systolic BP Percentile --      Diastolic BP Percentile --      Pulse Rate 12/17/23 1952 (!) 124     Resp 12/17/23 1952 20     Temp 12/17/23 1952 98.2 F (36.8 C)     Temp Source 12/17/23 1952 Oral     SpO2 12/17/23 1952 100 %     Weight --      Height --      Head Circumference --      Peak Flow --      Pain Score 12/17/23 1949 9     Pain Loc --      Pain Education --      Exclude from Growth Chart --     Most recent vital signs: Vitals:   12/17/23 1952   BP: 125/62  Pulse: (!) 124  Resp: 20  Temp: 98.2 F (36.8 C)  SpO2: 100%    CONSTITUTIONAL: Alert, responds appropriately to questions.  Chronically ill-appearing HEAD: Normocephalic, atraumatic EYES: Conjunctivae clear, pupils appear equal, sclera nonicteric ENT: normal nose; moist mucous membranes NECK: Supple, normal ROM CARD: Regular and tachycardic; S1 and S2 appreciated RESP: Normal chest excursion without splinting or tachypnea; breath sounds clear and equal bilaterally; no wheezes, no rhonchi, no rales, no hypoxia or respiratory distress, speaking full sentences ABD/GI: Non-distended; soft, non-tender, no rebound, no guarding, no peritoneal signs BACK: The back appears normal EXT: Patient has increased redness, warmth to the right dorsal foot with fluctuance and swelling to the superficial tissues of the distal great toe.  He has +1 pitting edema and calf tenderness but compartments in the right leg are soft.  Extremities feel warm and  well-perfused.  No joint effusion noted. SKIN: Normal color for age and race; warm; no rash on exposed skin NEURO: Moves all extremities equally, normal speech PSYCH: The patient's mood and manner are appropriate.    RIGHT foot   Patient gave verbal permission to utilize photo for medical documentation only. The image was not stored on any personal device.  ED Results / Procedures / Treatments   LABS: (all labs ordered are listed, but only abnormal results are displayed) Labs Reviewed  COMPREHENSIVE METABOLIC PANEL WITH GFR - Abnormal; Notable for the following components:      Result Value   Potassium 3.1 (*)    CO2 21 (*)    Glucose, Bld 110 (*)    Creatinine, Ser 1.26 (*)    Total Bilirubin 1.4 (*)    Anion gap 16 (*)    All other components within normal limits  LACTIC ACID, PLASMA - Abnormal; Notable for the following components:   Lactic Acid, Venous 3.1 (*)    All other components within normal limits  CBC WITH  DIFFERENTIAL/PLATELET - Abnormal; Notable for the following components:   Hemoglobin 12.9 (*)    RDW 16.2 (*)    All other components within normal limits  CULTURE, BLOOD (ROUTINE X 2)  CULTURE, BLOOD (ROUTINE X 2)  RESP PANEL BY RT-PCR (RSV, FLU A&B, COVID)  [id number]  PROTIME-INR  LACTIC ACID, PLASMA  URINALYSIS, W/ REFLEX TO CULTURE (INFECTION SUSPECTED)     EKG:  EKG Interpretation Date/Time:  Thursday Dec 17 2023 19:56:49 EDT Ventricular Rate:  121 PR Interval:  156 QRS Duration:  96 QT Interval:  322 QTC Calculation: 457 R Axis:   104  Text Interpretation: Sinus tachycardia Right atrial enlargement Rightward axis Cannot rule out Anterior infarct , age undetermined Abnormal ECG When compared with ECG of 21-Aug-2023 00:02, PREVIOUS ECG IS PRESENT Confirmed by Verneda Golder [id number]) on 12/17/2023 11:11:32 PM         RADIOLOGY: My personal review and interpretation of imaging: Right foot x-ray shows no osteomyelitis.  No gas.  Chest x-ray clear.  I have personally reviewed all radiology reports.   DG Foot 2 Views Right Result Date: 12/17/2023 CLINICAL DATA:  Cellulitis. Right foot pain for 1 month. Wound to big toe. Redness and swelling. EXAM: RIGHT FOOT - 2 VIEW COMPARISON:  None Available. FINDINGS: Generalized soft tissue edema. Site of wound is not well delineated by radiograph. No erosive or bony destructive change. No fracture. Mild degenerative change of the first metatarsal phalangeal joint. No soft tissue gas or radiopaque foreign body. Small plantar calcaneal spur. IMPRESSION: 1. Generalized soft tissue edema. No radiographic evidence of osteomyelitis. 2. Mild degenerative change of the first metatarsophalangeal joint. Electronically Signed   By: Chadwick Colonel M.D.   On: 12/17/2023 23:29   DG Chest 2 View Result Date: 12/17/2023 CLINICAL DATA:  Suspected sepsis EXAM: CHEST - 2 VIEW COMPARISON:  02/10/2022 FINDINGS: Right Port-A-Cath remains in place, unchanged. Heart  mediastinal contours within normal limits. No acute confluent airspace opacities or effusions. Post treatment scarring noted in the right suprahilar region, unchanged. No acute bony abnormality. IMPRESSION: No active cardiopulmonary disease. Electronically Signed   By: Janeece Mechanic M.D.   On: 12/17/2023 21:05     PROCEDURES:  Critical Care performed: Yes, see critical care procedure note(s)   CRITICAL CARE Performed by: Starling Eck Alysa Duca   Total critical care time: 30 minutes  Critical care time was exclusive of separately billable procedures and treating other patients.  Critical care was necessary to treat or prevent imminent or life-threatening deterioration.  Critical care was time spent personally by me on the following activities: development of treatment plan with patient and/or surrogate as well as nursing, discussions with consultants, evaluation of patient's response to treatment, examination of patient, obtaining history from patient or surrogate, ordering and performing treatments and interventions, ordering and review of laboratory studies, ordering and review of radiographic studies, pulse oximetry and re-evaluation of patient's condition.   Aaron Aas1-3 Lead EKG Interpretation  Performed by: Allisha Harter, Clover Dao, DO Authorized by: Alexandra Posadas, Clover Dao, DO     Interpretation: abnormal     ECG rate:  124   ECG rate assessment: tachycardic     Rhythm: sinus tachycardia     Ectopy: none     Conduction: normal       IMPRESSION / MDM / ASSESSMENT AND PLAN / ED COURSE  I reviewed the triage vital signs and the nursing notes.    Patient here for cellulitis not improving with outpatient therapy.  The patient is on the cardiac monitor to evaluate for evidence of arrhythmia and/or significant heart rate changes.   DIFFERENTIAL DIAGNOSIS (includes but not limited to):   Cellulitis, abscess, gout, septic arthritis, DVT, sepsis, no signs of compartment syndrome   Patient's presentation is  most consistent with acute presentation with potential threat to life or bodily function.   PLAN: Patient is tachycardic.  No leukocytosis.  Lactic of 3.1.  Minimally elevated creatinine compared to baseline.  Chest x-ray reviewed and interpreted by myself and the radiologist and is unremarkable.  He reports having a venous Doppler of this leg about a month ago that showed no DVT.  Will obtain x-ray of the foot to evaluate for any bony abnormality but he may benefit from MRI not emergently.  Will start on IV antibiotics.  Patient will need admission.   MEDICATIONS GIVEN IN ED: Medications  lactated ringers  bolus 1,000 mL (1,000 mLs Intravenous New Bag/Given 12/17/23 2348)    And  lactated ringers  bolus 1,000 mL (1,000 mLs Intravenous New Bag/Given 12/17/23 2349)  cefTRIAXone  (ROCEPHIN ) 2 g in sodium chloride  0.9 % 100 mL IVPB (has no administration in time range)  vancomycin  (VANCOCIN ) IVPB 1000 mg/200 mL premix (has no administration in time range)  fentaNYL  (SUBLIMAZE ) injection 50 mcg (has no administration in time range)  ondansetron  (ZOFRAN ) injection 4 mg (has no administration in time range)     ED COURSE: Patient getting 30 mL/kg IV fluid bolus based on his ideal body weight.  Normal blood pressures.  Repeat lactic pending.  Will discuss with hospitalist for admission.   CONSULTS:  Consulted and discussed patient's case with hospitalist, Dr. Achilles Holes.  I have recommended admission and consulting physician agrees and will place admission orders.  Patient (and family if present) agree with this plan.   I reviewed all nursing notes, vitals, pertinent previous records.  All labs, EKGs, imaging ordered have been independently reviewed and interpreted by myself.    OUTSIDE RECORDS REVIEWED: Reviewed recent oncology notes.       FINAL CLINICAL IMPRESSION(S) / ED DIAGNOSES   Final diagnoses:  Cellulitis of right foot  Acute sepsis (HCC)     Rx / DC Orders   ED Discharge Orders      None        Note:  This document was prepared using Dragon voice recognition software and may include unintentional dictation errors.   Ziaire Bieser, Clover Dao, DO 12/17/23 2355

## 2023-12-17 NOTE — ED Triage Notes (Signed)
 Pt to ED via POV c/o right foot pain x1 month. Pt reports having a wound to right big toe since mid April. Has completes a round of antibiotics for it. Pt has been having redness and swelling after completing antibiotics, about 3 weeks ago. Pt said that today pts dog stepped on his toe, re injuring it. Pt has pus coming from wound. Denies any fevers.

## 2023-12-17 NOTE — Progress Notes (Signed)
 Elink monitoring for the code sepsis protocol.  0041 Notified bedside nurse of need to draw repeat lactic acid.

## 2023-12-18 ENCOUNTER — Encounter (HOSPITAL_COMMUNITY): Payer: Self-pay

## 2023-12-18 ENCOUNTER — Ambulatory Visit (HOSPITAL_COMMUNITY)

## 2023-12-18 DIAGNOSIS — L03115 Cellulitis of right lower limb: Secondary | ICD-10-CM | POA: Diagnosis not present

## 2023-12-18 DIAGNOSIS — G40909 Epilepsy, unspecified, not intractable, without status epilepticus: Secondary | ICD-10-CM

## 2023-12-18 DIAGNOSIS — E039 Hypothyroidism, unspecified: Secondary | ICD-10-CM | POA: Insufficient documentation

## 2023-12-18 DIAGNOSIS — E785 Hyperlipidemia, unspecified: Secondary | ICD-10-CM | POA: Insufficient documentation

## 2023-12-18 LAB — CBC
HCT: 30.1 % — ABNORMAL LOW (ref 39.0–52.0)
Hemoglobin: 10.2 g/dL — ABNORMAL LOW (ref 13.0–17.0)
MCH: 28.9 pg (ref 26.0–34.0)
MCHC: 33.9 g/dL (ref 30.0–36.0)
MCV: 85.3 fL (ref 80.0–100.0)
Platelets: 199 10*3/uL (ref 150–400)
RBC: 3.53 MIL/uL — ABNORMAL LOW (ref 4.22–5.81)
RDW: 16 % — ABNORMAL HIGH (ref 11.5–15.5)
WBC: 5.3 10*3/uL (ref 4.0–10.5)
nRBC: 0 % (ref 0.0–0.2)

## 2023-12-18 LAB — BASIC METABOLIC PANEL WITH GFR
Anion gap: 8 (ref 5–15)
BUN: 10 mg/dL (ref 8–23)
CO2: 27 mmol/L (ref 22–32)
Calcium: 8.3 mg/dL — ABNORMAL LOW (ref 8.9–10.3)
Chloride: 102 mmol/L (ref 98–111)
Creatinine, Ser: 1.11 mg/dL (ref 0.61–1.24)
GFR, Estimated: >60 mL/min (ref >60)
Glucose, Bld: 98 mg/dL (ref 70–99)
Potassium: 3.1 mmol/L — ABNORMAL LOW (ref 3.5–5.1)
Sodium: 137 mmol/L (ref 135–145)

## 2023-12-18 LAB — RESP PANEL BY RT-PCR (RSV, FLU A&B, COVID)  RVPGX2
Influenza A by PCR: NEGATIVE
Influenza B by PCR: NEGATIVE
Resp Syncytial Virus by PCR: NEGATIVE
SARS Coronavirus 2 by RT PCR: NEGATIVE

## 2023-12-18 LAB — LACTIC ACID, PLASMA: Lactic Acid, Venous: 2.1 mmol/L (ref 0.5–1.9)

## 2023-12-18 MED ORDER — SODIUM CHLORIDE 0.9 % IV SOLN: INTRAVENOUS | Status: AC

## 2023-12-18 MED ORDER — ONDANSETRON HCL 4 MG PO TABS
4.0000 mg | ORAL_TABLET | Freq: Four times a day (QID) | ORAL | Status: DC | PRN
Start: 2023-12-18 — End: 2023-12-22

## 2023-12-18 MED ORDER — ONDANSETRON HCL 4 MG/2ML IJ SOLN
4.0000 mg | Freq: Four times a day (QID) | INTRAMUSCULAR | Status: DC | PRN
Start: 2023-12-18 — End: 2023-12-22

## 2023-12-18 MED ORDER — ACETAMINOPHEN 650 MG RE SUPP
650.0000 mg | Freq: Four times a day (QID) | RECTAL | Status: DC | PRN
Start: 2023-12-18 — End: 2023-12-22

## 2023-12-18 MED ORDER — POTASSIUM CHLORIDE CRYS ER 20 MEQ PO TBCR
40.0000 meq | EXTENDED_RELEASE_TABLET | Freq: Once | ORAL | Status: AC
  Administered 2023-12-18: 40 meq via ORAL
  Filled 2023-12-18: qty 2

## 2023-12-18 MED ORDER — MORPHINE SULFATE (PF) 2 MG/ML IV SOLN: 1.0000 mg | INTRAVENOUS | Status: DC | PRN

## 2023-12-18 MED ORDER — CLOPIDOGREL BISULFATE 75 MG PO TABS
75.0000 mg | ORAL_TABLET | Freq: Every day | ORAL | Status: DC
  Administered 2023-12-18 – 2023-12-22 (×5): 75 mg via ORAL
  Filled 2023-12-18 (×5): qty 1

## 2023-12-18 MED ORDER — ROSUVASTATIN CALCIUM 20 MG PO TABS
40.0000 mg | ORAL_TABLET | Freq: Every day | ORAL | Status: DC
  Administered 2023-12-18 – 2023-12-22 (×5): 40 mg via ORAL
  Filled 2023-12-18 (×2): qty 2
  Filled 2023-12-18 (×2): qty 4
  Filled 2023-12-18: qty 2

## 2023-12-18 MED ORDER — TRAZODONE HCL 50 MG PO TABS
25.0000 mg | ORAL_TABLET | Freq: Every evening | ORAL | Status: DC | PRN
  Administered 2023-12-18 – 2023-12-21 (×2): 25 mg via ORAL
  Filled 2023-12-18 (×2): qty 1

## 2023-12-18 MED ORDER — MAGNESIUM HYDROXIDE 400 MG/5ML PO SUSP: 30.0000 mL | Freq: Every day | ORAL | Status: DC | PRN

## 2023-12-18 MED ORDER — LEVOTHYROXINE SODIUM 50 MCG PO TABS
125.0000 ug | ORAL_TABLET | Freq: Every day | ORAL | Status: DC
  Administered 2023-12-18 – 2023-12-22 (×5): 125 ug via ORAL
  Filled 2023-12-18 (×5): qty 1

## 2023-12-18 MED ORDER — SODIUM CHLORIDE 0.9 % IV SOLN
2.0000 g | Freq: Three times a day (TID) | INTRAVENOUS | Status: DC
  Administered 2023-12-18 – 2023-12-22 (×13): 2 g via INTRAVENOUS
  Filled 2023-12-18 (×14): qty 12.5

## 2023-12-18 MED ORDER — VANCOMYCIN HCL IN DEXTROSE 1-5 GM/200ML-% IV SOLN
1000.0000 mg | Freq: Two times a day (BID) | INTRAVENOUS | Status: DC
  Administered 2023-12-18 – 2023-12-22 (×8): 1000 mg via INTRAVENOUS
  Filled 2023-12-18 (×9): qty 200

## 2023-12-18 MED ORDER — VANCOMYCIN HCL IN DEXTROSE 1-5 GM/200ML-% IV SOLN
1000.0000 mg | Freq: Once | INTRAVENOUS | Status: AC
  Administered 2023-12-18: 1000 mg via INTRAVENOUS
  Filled 2023-12-18: qty 200

## 2023-12-18 MED ORDER — ACETAMINOPHEN 325 MG PO TABS: 650.0000 mg | ORAL_TABLET | Freq: Four times a day (QID) | ORAL | Status: DC | PRN

## 2023-12-18 MED ORDER — OXYCODONE-ACETAMINOPHEN 5-325 MG PO TABS
1.0000 | ORAL_TABLET | Freq: Four times a day (QID) | ORAL | Status: DC | PRN
  Administered 2023-12-18 – 2023-12-21 (×5): 1 via ORAL
  Filled 2023-12-18 (×5): qty 1

## 2023-12-18 MED ORDER — ENOXAPARIN SODIUM 40 MG/0.4ML IJ SOSY
40.0000 mg | PREFILLED_SYRINGE | INTRAMUSCULAR | Status: DC
  Administered 2023-12-22: 40 mg via SUBCUTANEOUS
  Filled 2023-12-18 (×3): qty 0.4

## 2023-12-18 MED ORDER — ASPIRIN 81 MG PO TBEC
81.0000 mg | DELAYED_RELEASE_TABLET | Freq: Every day | ORAL | Status: DC
  Administered 2023-12-18 – 2023-12-22 (×5): 81 mg via ORAL
  Filled 2023-12-18 (×5): qty 1

## 2023-12-18 MED ORDER — LEVETIRACETAM 500 MG PO TABS
1000.0000 mg | ORAL_TABLET | Freq: Two times a day (BID) | ORAL | Status: DC
  Administered 2023-12-18 – 2023-12-22 (×9): 1000 mg via ORAL
  Filled 2023-12-18 (×9): qty 2

## 2023-12-18 MED ORDER — SODIUM CHLORIDE 0.9 % IV SOLN: 2.0000 g | Freq: Once | INTRAVENOUS | Status: DC

## 2023-12-18 NOTE — Progress Notes (Signed)
 CODE SEPSIS - PHARMACY COMMUNICATION  **Broad Spectrum Antibiotics should be administered within 1 hour of Sepsis diagnosis**  Time Code Sepsis Called/Page Received: 2311  Antibiotics Ordered: Ceftriaxone & Vancomycin  Time of 1st antibiotic administration: 0007  Coretta Dexter, PharmD, [facility] 12/18/2023 12:39 AM

## 2023-12-18 NOTE — Assessment & Plan Note (Signed)
-   Will continue Plavix  and aspirin .

## 2023-12-18 NOTE — Plan of Care (Signed)

## 2023-12-18 NOTE — Evaluation (Signed)
 Physical Therapy Evaluation Patient Details Name: Joshua [name] MRN: [id number] DOB: 09/25/1955 Today's Date: 12/18/2023  History of Present Illness  68 y.o. 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   Clinical Impression  Patient alert, agreeable to PT, with OT in hallway. Able to ambulate ~175ft with RW, no LOB noted. Intermittent LLE circumduction noted, pt reported he felt it was near his baseline however. Ongoing complaints of R toe pain though pt endorsed better than admission. At baseline the pt uses a variety of DME (RW, rollator, or cane) depending on the situation/location. The patient demonstrated and reported return to baseline level of functioning, no further acute PT needs indicated. PT to sign off. Please reconsult PT if pt status changes or acute needs are identified.          If plan is discharge home, recommend the following: Assist for transportation;Assistance with cooking/housework   Can travel by private vehicle        Equipment Recommendations None recommended by PT  Recommendations for Other Services       Functional Status Assessment Patient has not had a recent decline in their functional status     Precautions / Restrictions Precautions Precautions: Fall Recall of Precautions/Restrictions: Intact Restrictions Weight Bearing Restrictions Per Provider Order: No      Mobility  Bed Mobility               General bed mobility comments: pt ambulating in hallway upon PT arrival    Transfers Overall transfer level: Needs assistance Equipment used: Rolling walker (2 wheels) Transfers: Sit to/from Stand Sit to Stand: Supervision                 Ambulation/Gait Ambulation/Gait assistance: Supervision, Contact guard assist Gait Distance (Feet): 170 Feet Assistive device: Rolling walker (2 wheels)         General Gait Details: increased LLE circumduction compared to R, but no LOB, pt endorsed feeling near his baseline  Stairs            Wheelchair Mobility     Tilt Bed    Modified Rankin (Stroke Patients Only)       Balance Overall balance assessment: No apparent balance deficits (not formally assessed) Sitting-balance support: No upper extremity supported, Feet supported Sitting balance-Leahy Scale: Good     Standing balance support: Bilateral upper extremity supported Standing balance-Leahy Scale: Good                               Pertinent Vitals/Pain Pain Assessment Pain Assessment: 0-10 Pain Score: 5  Pain Location: R great toe Pain Descriptors / Indicators: Aching Pain Intervention(s): Limited activity within patient's tolerance, Monitored during session, Repositioned, Patient requesting pain meds-RN notified    Home Living Family/patient expects to be discharged to:: Private residence Living Arrangements: Alone Available Help at Discharge: Family;Friend(s);Available PRN/intermittently (aunt in [place]) Type of Home: Mobile home Home Access: Ramped entrance       Home Layout: One level Home Equipment: Agricultural consultant (2 wheels);Wheelchair - manual;Shower seat;Grab bars - tub/shower;Hand held shower head;Cane - single point;Rollator (4 wheels);BSC/3in1      Prior Function Prior Level of Function : Driving;Independent/Modified Independent;History of Falls (last six months)  Mobility Comments: RW in the house with tray on top, SPC outside the home, rollator to/from the mailbox, keeps rollator in the car as needed, 1 fall in April when he got the toe injury ADLs Comments: mod indep, slow 2/2 R foot issues lately, drives     Extremity/Trunk Assessment    Upper Extremity Assessment Upper Extremity Assessment: Overall WFL for tasks assessed    Lower Extremity Assessment Lower Extremity Assessment:  (able to move BLE against gravity) RLE Deficits / Details: R great toe red, swollen, pt noting improvement in swelling       Communication   Communication Communication: No apparent difficulties    Cognition Arousal: Alert Behavior During Therapy: WFL for tasks assessed/performed                             Following commands: Intact       Cueing       General Comments      Exercises     Assessment/Plan    PT Assessment Patient does not need any further PT services  PT Problem List         PT Treatment Interventions      PT Goals (Current goals can be found in the Care Plan section)       Frequency       Co-evaluation               AM-PAC PT "6 Clicks" Mobility  Outcome Measure Help needed turning from your back to your side while in a flat bed without using bedrails?: None Help needed moving from lying on your back to sitting on the side of a flat bed without using bedrails?: None Help needed moving to and from a bed to a chair (including a wheelchair)?: None Help needed standing up from a chair using your arms (e.g., wheelchair or bedside chair)?: None Help needed to walk in hospital room?: None Help needed climbing 3-5 steps with a railing? : None 6 Click Score: 24    End of Session Equipment Utilized During Treatment: Gait belt Activity Tolerance: Patient tolerated treatment well Patient left: in chair;with call bell/phone within reach;with chair alarm set Nurse Communication: Mobility status PT Visit Diagnosis: Difficulty in walking, not elsewhere classified (R26.2)    Time: 5409-8119 PT Time Calculation (min) (ACUTE ONLY): 10 min   Charges:   PT Evaluation $PT Eval Low Complexity: 1 Low   PT General Charges $$ ACUTE PT VISIT: 1 Visit         Darien Eden PT, DPT 9:13  AM,12/18/23

## 2023-12-18 NOTE — Progress Notes (Signed)
 PROGRESS NOTE    Joshua [name]  ZOX:[id number] DOB: 10-09-1955 DOA: 12/17/2023 PCP: Delmus Ferri, MD    Assessment & Plan:   Principal Problem:   Cellulitis of right foot Active Problems:   PVD (peripheral vascular disease) (HCC)   Hypothyroidism   Seizure disorder (HCC)   Dyslipidemia  Assessment and Plan: Cellulitis of right foot: continue on IV cefepime , vanco. No osteomyelitis noted on XR.  PVD: continue on home dose of aspirin , plavix     HLD: continue on statin   Hypokalemia: potassium given    Seizure disorder: continue on home dose of keppra     Hypothyroidism: continue on home dose of synthroid   PreDM: HbA1c 6.3 in 05/2023. Encourage healthy diet     DVT prophylaxis: lovenox   Code Status: full  Family Communication:  Disposition Plan: likely d/c back home  Level of care: Med-Surg  Status is: Inpatient Remains inpatient appropriate because: severity of illness     Consultants:    Procedures:   Antimicrobials: cefepime , vanco    Subjective: Pt c/o right foot pain   Objective: Vitals:   12/18/23 0043 12/18/23 0100 12/18/23 0201 12/18/23 0749  BP: (!) 148/79   119/66  Pulse: 93   86  Resp: 17   16  Temp: 98.4 F (36.9 C)   98 F (36.7 C)  TempSrc:    Oral  SpO2: 98%   96%  Weight:  96.3 kg 96.6 kg     Intake/Output Summary (Last 24 hours) at 12/18/2023 0805 Last data filed at 12/18/2023 0700 Gross per 24 hour  Intake 2920.05 ml  Output 1850 ml  Net 1070.05 ml   Filed Weights   12/18/23 0100 12/18/23 0201  Weight: 96.3 kg 96.6 kg    Examination:  General exam: Appears calm but uncomfortable  Respiratory system: Clear to auscultation. Respiratory effort normal. Cardiovascular system: S1 & S2+. No rubs, gallops or clicks.  Gastrointestinal system: Abdomen is nondistended, soft and nontender. Normal bowel sounds heard. Central nervous system: Alert and oriented. Moves all extremities Skin: right foot is edematous,  erythematous & tender to palpation   Psychiatry: Judgement and insight appear normal. Mood & affect appropriate.     Data Reviewed: I have personally reviewed following labs and imaging studies  CBC: Recent Labs  Lab 12/17/23 2000 12/18/23 0235  WBC 6.2 5.3  NEUTROABS 4.7  --   HGB 12.9* 10.2*  HCT 40.0 30.1*  MCV 88.9 85.3  PLT 282 199   Basic Metabolic Panel: Recent Labs  Lab 12/17/23 2000 12/18/23 0235  NA 136 137  K 3.1* 3.1*  CL 99 102  CO2 21* 27  GLUCOSE 110* 98  BUN 10 10  CREATININE 1.26* 1.11  CALCIUM  8.9 8.3*   GFR: Estimated Creatinine Clearance: 78.6 mL/min (by C-G formula based on SCr of 1.11 mg/dL). Liver Function Tests: Recent Labs  Lab 12/17/23 2000  AST 33  ALT 23  ALKPHOS 72  BILITOT 1.4*  PROT 7.5  ALBUMIN  3.6   No results for input(s): "LIPASE", "AMYLASE" in the last 168 hours. No results for input(s): "AMMONIA" in the last 168 hours. Coagulation Profile: Recent Labs  Lab 12/17/23 2000  INR 1.1   Cardiac Enzymes: No results for input(s): "CKTOTAL", "CKMB", "CKMBINDEX", "TROPONINI" in the last 168 hours. BNP (last 3 results) No results for input(s): "PROBNP" in the last 8760 hours. HbA1C: No results for input(s): "HGBA1C" in the last 72 hours. CBG: No results for input(s): "GLUCAP" in the last 168 hours.  Lipid Profile: No results for input(s): "CHOL", "HDL", "LDLCALC", "TRIG", "CHOLHDL", "LDLDIRECT" in the last 72 hours. Thyroid  Function Tests: No results for input(s): "TSH", "T4TOTAL", "FREET4", "T3FREE", "THYROIDAB" in the last 72 hours. Anemia Panel: No results for input(s): "VITAMINB12", "FOLATE", "FERRITIN", "TIBC", "IRON", "RETICCTPCT" in the last 72 hours. Sepsis Labs: Recent Labs  Lab 12/17/23 2000 12/18/23 0045  LATICACIDVEN 3.1* 2.1*    Recent Results (from the past 240 hours)  Culture, blood (Routine x 2)     Status: None (Preliminary result)   Collection Time: 12/17/23  8:00 PM   Specimen: BLOOD  Result  Value Ref Range Status   Specimen Description BLOOD BLOOD LEFT ARM  Final   Special Requests   Final    BOTTLES DRAWN AEROBIC AND ANAEROBIC Blood Culture results may not be optimal due to an inadequate volume of blood received in culture bottles   Culture   Final    NO GROWTH < 12 HOURS Performed at [facility], [address], [place], [place] [address]    Report Status PENDING  Incomplete  Culture, blood (Routine x 2)     Status: None (Preliminary result)   Collection Time: 12/17/23 11:28 PM   Specimen: BLOOD  Result Value Ref Range Status   Specimen Description BLOOD LEFT ARM  Final   Special Requests   Final    BOTTLES DRAWN AEROBIC AND ANAEROBIC Blood Culture adequate volume   Culture   Final    NO GROWTH < 12 HOURS Performed at [facility], [address] [place], [place] [address]    Report Status PENDING  Incomplete  Resp panel by RT-PCR (RSV, Flu A&B, Covid) Anterior Nasal Swab     Status: None   Collection Time: 12/17/23 11:28 PM   Specimen: Anterior Nasal Swab  Result Value Ref Range Status   SARS Coronavirus 2 by RT PCR NEGATIVE NEGATIVE Final    Comment: (NOTE) SARS-CoV-2 target nucleic acids are NOT DETECTED.  The SARS-CoV-2 RNA is generally detectable in upper respiratory specimens during the acute phase of infection. The lowest concentration of SARS-CoV-2 viral copies this assay can detect is 138 copies/mL. A negative result does not preclude SARS-Cov-2 infection and should not be used as the sole basis for treatment or other patient management decisions. A negative result may occur with  improper specimen collection/handling, submission of specimen other than nasopharyngeal swab, presence of viral mutation(s) within the areas targeted by this assay, and inadequate number of viral copies(<138 copies/mL). A negative result must be combined with clinical observations, patient history, and epidemiological information. The expected result is  Negative.  Fact Sheet for Patients:  BloggerCourse.com  Fact Sheet for Healthcare Providers:  SeriousBroker.it  This test is no t yet approved or cleared by the [place]  FDA and  has been authorized for detection and/or diagnosis of SARS-CoV-2 by FDA under an Emergency Use Authorization (EUA). This EUA will remain  in effect (meaning this test can be used) for the duration of the COVID-19 declaration under Section 564(b)(1) of the Act, 21 U.S.C.section 360bbb-3(b)(1), unless the authorization is terminated  or revoked sooner.       Influenza A by PCR NEGATIVE NEGATIVE Final   Influenza B by PCR NEGATIVE NEGATIVE Final    Comment: (NOTE) The Xpert Xpress SARS-CoV-2/FLU/RSV plus assay is intended as an aid in the diagnosis of influenza from Nasopharyngeal swab specimens and should not be used as a sole basis for treatment. Nasal washings and aspirates are unacceptable for  Xpert Xpress SARS-CoV-2/FLU/RSV testing.  Fact Sheet for Patients: BloggerCourse.com  Fact Sheet for Healthcare Providers: SeriousBroker.it  This test is not yet approved or cleared by the [place]  FDA and has been authorized for detection and/or diagnosis of SARS-CoV-2 by FDA under an Emergency Use Authorization (EUA). This EUA will remain in effect (meaning this test can be used) for the duration of the COVID-19 declaration under Section 564(b)(1) of the Act, 21 U.S.C. section 360bbb-3(b)(1), unless the authorization is terminated or revoked.     Resp Syncytial Virus by PCR NEGATIVE NEGATIVE Final    Comment: (NOTE) Fact Sheet for Patients: BloggerCourse.com  Fact Sheet for Healthcare Providers: SeriousBroker.it  This test is not yet approved or cleared by the [place]  FDA and has been authorized for detection and/or diagnosis of  SARS-CoV-2 by FDA under an Emergency Use Authorization (EUA). This EUA will remain in effect (meaning this test can be used) for the duration of the COVID-19 declaration under Section 564(b)(1) of the Act, 21 U.S.C. section 360bbb-3(b)(1), unless the authorization is terminated or revoked.  Performed at [facility], [address], [place], [place] [address]          Radiology Studies: DG Foot 2 Views Right Result Date: 12/17/2023 CLINICAL DATA:  Cellulitis. Right foot pain for 1 month. Wound to big toe. Redness and swelling. EXAM: RIGHT FOOT - 2 VIEW COMPARISON:  None Available. FINDINGS: Generalized soft tissue edema. Site of wound is not well delineated by radiograph. No erosive or bony destructive change. No fracture. Mild degenerative change of the first metatarsal phalangeal joint. No soft tissue gas or radiopaque foreign body. Small plantar calcaneal spur. IMPRESSION: 1. Generalized soft tissue edema. No radiographic evidence of osteomyelitis. 2. Mild degenerative change of the first metatarsophalangeal joint. Electronically Signed   By: Chadwick Colonel M.D.   On: 12/17/2023 23:29   DG Chest 2 View Result Date: 12/17/2023 CLINICAL DATA:  Suspected sepsis EXAM: CHEST - 2 VIEW COMPARISON:  02/10/2022 FINDINGS: Right Port-A-Cath remains in place, unchanged. Heart mediastinal contours within normal limits. No acute confluent airspace opacities or effusions. Post treatment scarring noted in the right suprahilar region, unchanged. No acute bony abnormality. IMPRESSION: No active cardiopulmonary disease. Electronically Signed   By: Janeece Mechanic M.D.   On: 12/17/2023 21:05        Scheduled Meds:  aspirin  EC  81 mg Oral Daily   clopidogrel   75 mg Oral Daily   enoxaparin  (LOVENOX ) injection  40 mg Subcutaneous Q24H   levETIRAcetam   1,000 mg Oral BID   levothyroxine   125 mcg Oral Q0600   potassium chloride   40 mEq Oral Once   rosuvastatin   40 mg Oral Daily   Continuous  Infusions:  sodium chloride  40 mL/hr at 12/18/23 0309   ceFEPime (MAXIPIME) IV 200 mL/hr at 12/18/23 0650   vancomycin       LOS: 1 day      Alphonsus Jeans, MD [facility] Pager [phone]  If 7PM-7AM, please contact night-coverage [email or web address] 12/18/2023, 8:05 AM

## 2023-12-18 NOTE — Progress Notes (Signed)
 K+ 3.1. MD made aware.

## 2023-12-18 NOTE — H&P (Signed)
 [name]   PATIENT NAME: Joshua [name]    MR#:  [id number]  DATE OF BIRTH:  03-21-1956  DATE OF ADMISSION:  12/17/2023  PRIMARY CARE PHYSICIAN: Delmus Ferri, MD   Patient is coming from: Home  REQUESTING/REFERRING PHYSICIAN: Ward, Starling Eck, DO  CHIEF COMPLAINT:   Chief Complaint  Patient presents with   Wound Infection    HISTORY OF PRESENT ILLNESS:  Joshua [name] is a 68 y.o. Caucasian male with medical history significant for COPD, hypertension, hypothyroidism, peripheral vascular disease and OSA as well as cancer, presented to the emergency room with acute onset of worsening right foot swelling with associated erythema, tenderness and occasional pain.  No fever or chills.  No nausea or vomiting or abdominal pain.  Did not any chest pain palpitations.  No cough or wheezing or hemoptysis.  It started on 4018 when he was walking bare feet and stubbed his right big toe with subsequent minor bleeding.  It was later seen for about symptoms of infection was given about a week and a half course of antibiotics including recent p.o. Keflex  without significant improvement.  No dysuria, oliguria or hematuria or flank pain.  No other bleeding diathesis.  ED Course: When he came to the ER, vital signs were within normal except for heart rate of 124 later 107.  Labs revealed hypokalemia 3.1 with creatinine 1.26 and lactic acid 3.1 albumin  2.1.  Respiratory panel came back negative.  Blood cultures were drawn. EKG as reviewed by me : She showed sinus tachycardia with rate 121 with poor R wave progression.  EKG with right is enlargement and right axis deviation. Imaging: 2 view chest x-ray showed no acute cardiopulmonary disease 2 view right foot x-ray showed generalized soft tissue swelling with no radiographic evidence of osteomyelitis.  It showed mild degenerative changes of the first metatarsophalangeal joint.  The patient was given IV Rocephin  and 2 L bolus of IV  lactated Ringer  as well as 4 mg of IV Zofran  and 50 mcg of IV fentanyl .  He will be admitted to a medical bed for further evaluation and management. PAST MEDICAL HISTORY:   Past Medical History:  Diagnosis Date   Allergy    Cancer (HCC)    lung   COPD (chronic obstructive pulmonary disease) (HCC)    Dyspnea    former smoker. 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   Malignant neoplasm metastatic to brain [facility])    Peripheral vascular disease (HCC)    Pre-diabetes    Sleep apnea     PAST SURGICAL HISTORY:   Past Surgical History:  Procedure Laterality Date   ANTERIOR CERVICAL DECOMP/DISCECTOMY FUSION N/A 03/14/2020   Procedure: ANTERIOR CERVICAL DECOMPRESSION/DISCECTOMY FUSION 3 LEVELS C4-7;  Surgeon: Jodeen Munch, MD;  Location: [facility] ORS;  Service: Neurosurgery;  Laterality: N/A;   BACK SURGERY  2011   CENTRAL LINE INSERTION Right 09/10/2016   Procedure: CENTRAL LINE INSERTION;  Surgeon: Jackquelyn Mass, MD;  Location: [facility] ORS;  Service: Vascular;  Laterality: Right;   CERVICAL FUSION     C4-c7 on Mar 14, 2020   COLONOSCOPY  2013 ?   COLONOSCOPY N/A 09/09/2021   Procedure: COLONOSCOPY;  Surgeon: Quintin Buckle, DO;  Location: [facility] ENDOSCOPY;  Service: Gastroenterology;  Laterality: N/A;   CORONARY ANGIOPLASTY     ESOPHAGOGASTRODUODENOSCOPY N/A 09/09/2021   Procedure: ESOPHAGOGASTRODUODENOSCOPY (EGD);  Surgeon: Quintin Buckle, DO;  Location: [facility] ENDOSCOPY;  Service: Gastroenterology;  Laterality: N/A;   FEMORAL-POPLITEAL BYPASS GRAFT Right 09/10/2016   Procedure: BYPASS GRAFT FEMORAL-POPLITEAL ARTERY ( BELOW KNEE );  Surgeon: Jackquelyn Mass, MD;  Location: [facility] ORS;  Service: Vascular;  Laterality: Right;   FEMORAL-TIBIAL BYPASS GRAFT Right 03/23/2020   Procedure: BYPASS GRAFT RIGHT FEMORAL- DISTAL TIBIAL ARTERY WITH DISTAL FLOW GRAFT;  Surgeon: Jackquelyn Mass, MD;  Location: [facility] ORS;  Service:  Vascular;  Laterality: Right;   LEFT HEART CATH AND CORONARY ANGIOGRAPHY Left 05/16/2021   Procedure: LEFT HEART CATH AND CORONARY ANGIOGRAPHY;  Surgeon: Link Rice, MD;  Location: [facility] INVASIVE CV LAB;  Service: Cardiovascular;  Laterality: Left;   LOWER EXTREMITY ANGIOGRAPHY Right 11/18/2016   Procedure: Lower Extremity Angiography;  Surgeon: Jackquelyn Mass, MD;  Location: [facility] INVASIVE CV LAB;  Service: Cardiovascular;  Laterality: Right;   LOWER EXTREMITY ANGIOGRAPHY Right 12/09/2016   Procedure: Lower Extremity Angiography;  Surgeon: Jackquelyn Mass, MD;  Location: [facility] INVASIVE CV LAB;  Service: Cardiovascular;  Laterality: Right;   LOWER EXTREMITY ANGIOGRAPHY Right 11/15/2019   Procedure: LOWER EXTREMITY ANGIOGRAPHY;  Surgeon: Jackquelyn Mass, MD;  Location: [facility] INVASIVE CV LAB;  Service: Cardiovascular;  Laterality: Right;   LOWER EXTREMITY ANGIOGRAPHY Right 12/01/2019   Procedure: Lower Extremity Angiography;  Surgeon: Celso College, MD;  Location: [facility] INVASIVE CV LAB;  Service: Cardiovascular;  Laterality: Right;   LOWER EXTREMITY ANGIOGRAPHY Right 12/02/2019   Procedure: LOWER EXTREMITY ANGIOGRAPHY;  Surgeon: Jackquelyn Mass, MD;  Location: [facility] INVASIVE CV LAB;  Service: Cardiovascular;  Laterality: Right;   LOWER EXTREMITY ANGIOGRAPHY Right 03/23/2020   Procedure: Lower Extremity Angiography;  Surgeon: Jackquelyn Mass, MD;  Location: [facility] INVASIVE CV LAB;  Service: Cardiovascular;  Laterality: Right;   LOWER EXTREMITY ANGIOGRAPHY Right 09/25/2021   Procedure: Lower Extremity Angiography;  Surgeon: Jackquelyn Mass, MD;  Location: [facility] INVASIVE CV LAB;  Service: Cardiovascular;  Laterality: Right;   LOWER EXTREMITY ANGIOGRAPHY Right 09/26/2021   Procedure: Lower Extremity Angiography;  Surgeon: Celso College, MD;  Location: [facility] INVASIVE CV LAB;  Service: Cardiovascular;  Laterality: Right;   LOWER EXTREMITY ANGIOGRAPHY Right 04/08/2022   Procedure: Lower Extremity  Angiography;  Surgeon: Jackquelyn Mass, MD;  Location: [facility] INVASIVE CV LAB;  Service: Cardiovascular;  Laterality: Right;   LOWER EXTREMITY INTERVENTION  11/18/2016   Procedure: Lower Extremity Intervention;  Surgeon: Jackquelyn Mass, MD;  Location: [facility] INVASIVE CV LAB;  Service: Cardiovascular;;   PERIPHERAL VASCULAR CATHETERIZATION Right 01/02/2015   Procedure: Lower Extremity Angiography;  Surgeon: Jackquelyn Mass, MD;  Location: [facility] INVASIVE CV LAB;  Service: Cardiovascular;  Laterality: Right;   PERIPHERAL VASCULAR CATHETERIZATION Right 06/18/2015   Procedure: Lower Extremity Angiography;  Surgeon: Celso College, MD;  Location: [facility] INVASIVE CV LAB;  Service: Cardiovascular;  Laterality: Right;   PERIPHERAL VASCULAR CATHETERIZATION  06/18/2015   Procedure: Lower Extremity Intervention;  Surgeon: Celso College, MD;  Location: [facility] INVASIVE CV LAB;  Service: Cardiovascular;;   PERIPHERAL VASCULAR CATHETERIZATION N/A 10/09/2015   Procedure: Abdominal Aortogram w/Lower Extremity;  Surgeon: Jackquelyn Mass, MD;  Location: [facility] INVASIVE CV LAB;  Service: Cardiovascular;  Laterality: N/A;   PERIPHERAL VASCULAR CATHETERIZATION  10/09/2015   Procedure: Lower Extremity Intervention;  Surgeon: Jackquelyn Mass, MD;  Location: [facility] INVASIVE CV LAB;  Service: Cardiovascular;;   PERIPHERAL VASCULAR CATHETERIZATION Right 10/10/2015   Procedure: Lower Extremity Angiography;  Surgeon: Celso College, MD;  Location: [facility] INVASIVE CV  LAB;  Service: Cardiovascular;  Laterality: Right;   PERIPHERAL VASCULAR CATHETERIZATION Right 10/10/2015   Procedure: Lower Extremity Intervention;  Surgeon: Celso College, MD;  Location: [facility] INVASIVE CV LAB;  Service: Cardiovascular;  Laterality: Right;   PERIPHERAL VASCULAR CATHETERIZATION Right 12/03/2015   Procedure: Lower Extremity Angiography;  Surgeon: Celso College, MD;  Location: [facility] INVASIVE CV LAB;  Service: Cardiovascular;  Laterality: Right;   PERIPHERAL VASCULAR  CATHETERIZATION Right 12/04/2015   Procedure: Lower Extremity Angiography;  Surgeon: Celso College, MD;  Location: [facility] INVASIVE CV LAB;  Service: Cardiovascular;  Laterality: Right;   PERIPHERAL VASCULAR CATHETERIZATION  12/04/2015   Procedure: Lower Extremity Intervention;  Surgeon: Celso College, MD;  Location: [facility] INVASIVE CV LAB;  Service: Cardiovascular;;   PERIPHERAL VASCULAR CATHETERIZATION Right 06/30/2016   Procedure: Lower Extremity Angiography;  Surgeon: Celso College, MD;  Location: [facility] INVASIVE CV LAB;  Service: Cardiovascular;  Laterality: Right;   PERIPHERAL VASCULAR CATHETERIZATION Right 06/25/2016   Procedure: Lower Extremity Angiography;  Surgeon: Jackquelyn Mass, MD;  Location: [facility] INVASIVE CV LAB;  Service: Cardiovascular;  Laterality: Right;   PERIPHERAL VASCULAR CATHETERIZATION Right 07/01/2016   Procedure: Lower Extremity Angiography;  Surgeon: Jackquelyn Mass, MD;  Location: [facility] INVASIVE CV LAB;  Service: Cardiovascular;  Laterality: Right;   PERIPHERAL VASCULAR CATHETERIZATION  07/01/2016   Procedure: Lower Extremity Intervention;  Surgeon: Jackquelyn Mass, MD;  Location: [facility] INVASIVE CV LAB;  Service: Cardiovascular;;   PERIPHERAL VASCULAR CATHETERIZATION Right 08/01/2016   Procedure: Lower Extremity Angiography;  Surgeon: Jackquelyn Mass, MD;  Location: [facility] INVASIVE CV LAB;  Service: Cardiovascular;  Laterality: Right;   PORTA CATH INSERTION N/A 10/05/2020   Procedure: PORTA CATH INSERTION;  Surgeon: Jackquelyn Mass, MD;  Location: [facility] INVASIVE CV LAB;  Service: Cardiovascular;  Laterality: N/A;   stent placement in right leg Right    VIDEO BRONCHOSCOPY N/A 09/14/2020   Procedure: VIDEO BRONCHOSCOPY WITH FLUORO;  Surgeon: Marc Senior, MD;  Location: [facility] ORS;  Service: Cardiopulmonary;  Laterality: N/A;   VIDEO BRONCHOSCOPY WITH ENDOBRONCHIAL ULTRASOUND N/A 09/14/2020   Procedure: VIDEO BRONCHOSCOPY WITH ENDOBRONCHIAL ULTRASOUND;  Surgeon: Marc Senior, MD;  Location: [facility] ORS;  Service: Cardiopulmonary;  Laterality: N/A;    SOCIAL HISTORY:   Social History   Tobacco Use   Smoking status: Former    Current packs/day: 0.00    Average packs/day: 2.0 packs/day for 38.0 years (76.0 ttl pk-yrs)    Types: Cigarettes    Start date: 12/19/1976    Quit date: 12/20/2014    Years since quitting: 9.0   Smokeless tobacco: Never   Tobacco comments:    quit smoking in 2017 most smoked 2   Substance Use Topics   Alcohol use: Not Currently    FAMILY HISTORY:   Family History  Problem Relation Age of Onset   Diabetes Mother    Hypertension Mother    Heart murmur Mother    Leukemia Mother    Throat cancer Maternal Grandmother    Colon cancer Neg Hx    Breast cancer Neg Hx     DRUG ALLERGIES:  No Active Allergies  REVIEW OF SYSTEMS:   ROS As per history of present illness. All pertinent systems were reviewed above. Constitutional, HEENT, cardiovascular, respiratory, GI, GU, musculoskeletal, neuro, psychiatric, endocrine, integumentary and hematologic systems were reviewed and are otherwise negative/unremarkable except for positive findings mentioned above in the HPI.   MEDICATIONS AT HOME:   Prior to Admission medications  Medication Sig Start Date End Date Taking? Authorizing Provider  aspirin  EC 81 MG tablet Take 81 mg by mouth daily. Swallow whole.    [provider]  cephALEXin  (KEFLEX ) 500 MG capsule Take 1 capsule (500 mg total) by mouth 4 (four) times daily for 10 days 12/08/23     clopidogrel  (PLAVIX ) 75 MG tablet TAKE 1 TABLET BY MOUTH DAILY 05/11/23   Brown, Fallon E, NP  levETIRAcetam  (KEPPRA ) 1000 MG tablet Take 1 tablet (1,000 mg total) by mouth 2 (two) times daily. 11/26/23   Vaslow, Zachary K, MD  levothyroxine  (SYNTHROID ) 125 MCG tablet Take 1 tablet (125 mcg total) by mouth daily before breakfast. 12/02/23   [name], MD  rosuvastatin  (CRESTOR ) 40 MG tablet Take 1 tablet (40 mg total) by mouth daily. 06/03/23    Normie Becton, FNP      VITAL SIGNS:  Blood pressure (!) 148/79, pulse 93, temperature 98.4 F (36.9 C), resp. rate 17, weight 96.6 kg, SpO2 98%.  PHYSICAL EXAMINATION:  Physical Exam  GENERAL:  68 y.o.-year-old Caucasian male patient lying in the bed with no acute distress.  EYES: Pupils equal, round, reactive to light and accommodation. No scleral icterus. Extraocular muscles intact.  HEENT: Head atraumatic, normocephalic. Oropharynx and nasopharynx clear.  NECK:  Supple, no jugular venous distention. No thyroid  enlargement, no tenderness.  LUNGS: Normal breath sounds bilaterally, no wheezing, rales,rhonchi or crepitation. No use of accessory muscles of respiration.  CARDIOVASCULAR: Regular rate and rhythm, S1, S2 normal. No murmurs, rubs, or gallops.  ABDOMEN: Soft, nondistended, nontender. Bowel sounds present. No organomegaly or mass.  EXTREMITIES: No pedal edema, cyanosis, or clubbing.  NEUROLOGIC: Cranial nerves II through XII are intact. Muscle strength 5/5 in all extremities. Sensation intact. Gait not checked.  PSYCHIATRIC: The patient is alert and oriented x 3.  Normal affect and good eye contact. SKIN: Right big toe and dorsal foot swelling with erythema and tenderness.   LABORATORY PANEL:   CBC Recent Labs  Lab 12/18/23 0235  WBC 5.3  HGB 10.2*  HCT 30.1*  PLT 199   ------------------------------------------------------------------------------------------------------------------  Chemistries  Recent Labs  Lab 12/17/23 2000 12/18/23 0235  NA 136 137  K 3.1* 3.1*  CL 99 102  CO2 21* 27  GLUCOSE 110* 98  BUN 10 10  CREATININE 1.26* 1.11  CALCIUM  8.9 8.3*  AST 33  --   ALT 23  --   ALKPHOS 72  --   BILITOT 1.4*  --    ------------------------------------------------------------------------------------------------------------------  Cardiac Enzymes No results for input(s): "TROPONINI" in the last 168  hours. ------------------------------------------------------------------------------------------------------------------  RADIOLOGY:  DG Foot 2 Views Right Result Date: 12/17/2023 CLINICAL DATA:  Cellulitis. Right foot pain for 1 month. Wound to big toe. Redness and swelling. EXAM: RIGHT FOOT - 2 VIEW COMPARISON:  None Available. FINDINGS: Generalized soft tissue edema. Site of wound is not well delineated by radiograph. No erosive or bony destructive change. No fracture. Mild degenerative change of the first metatarsal phalangeal joint. No soft tissue gas or radiopaque foreign body. Small plantar calcaneal spur. IMPRESSION: 1. Generalized soft tissue edema. No radiographic evidence of osteomyelitis. 2. Mild degenerative change of the first metatarsophalangeal joint. Electronically Signed   By: Chadwick Colonel M.D.   On: 12/17/2023 23:29   DG Chest 2 View Result Date: 12/17/2023 CLINICAL DATA:  Suspected sepsis EXAM: CHEST - 2 VIEW COMPARISON:  02/10/2022 FINDINGS: Right Port-A-Cath remains in place, unchanged. Heart mediastinal contours within normal limits. No acute confluent airspace opacities  or effusions. Post treatment scarring noted in the right suprahilar region, unchanged. No acute bony abnormality. IMPRESSION: No active cardiopulmonary disease. Electronically Signed   By: Janeece Mechanic M.D.   On: 12/17/2023 21:05      IMPRESSION AND PLAN:  Assessment and Plan: * Cellulitis of right foot - The patient was admitted to a medical-surgical bed. - Will continue antibiotic therapy with IV cefepime and vancomycin. - Warm compresses will be utilized. - Pain management will be provided.  PVD (peripheral vascular disease) (HCC) - Will continue Plavix  and aspirin .  Dyslipidemia - Will continue statin therapy.  Seizure disorder (HCC) - Will resume Keppra .  Hypothyroidism - Will continue Synthroid .   DVT prophylaxis: Lovenox .  Advanced Care Planning:  Code Status: full code.  Family  Communication:  The plan of care was discussed in details with the patient (and family). I answered all questions. The patient agreed to proceed with the above mentioned plan. Further management will depend upon hospital course. Disposition Plan: Back to previous home environment Consults called: none.  All the records are reviewed and case discussed with ED provider.  Status is: Inpatient    At the time of the admission, it appears that the appropriate admission status for this patient is inpatient.  This is judged to be reasonable and necessary in order to provide the required intensity of service to ensure the patient's safety given the presenting symptoms, physical exam findings and initial radiographic and laboratory data in the context of comorbid conditions.  The patient requires inpatient status due to high intensity of service, high risk of further deterioration and high frequency of surveillance required.  I certify that at the time of admission, it is my clinical judgment that the patient will require inpatient hospital care extending more than 2 midnights.                            Dispo: The patient is from: Home              Anticipated d/c is to: Home              Patient currently is not medically stable to d/c.              Difficult to place patient: No  Virgene Griffin M.D on 12/18/2023 at 5:41 AM  [facility]   From 7 PM-7 AM, contact night-coverage [email or web address]  CC: Primary care physician; Delmus Ferri, MD

## 2023-12-18 NOTE — Progress Notes (Signed)
 Pharmacy Antibiotic Note  Joshua [name] is a 68 y.o. male admitted on 12/17/2023 with cellulitis.  Pharmacy has been consulted for Vancomycin dosing for 7 days.  Plan: Pt given Vancomycin 2000 mg once. Vancomycin 1000 mg IV Q 12 hrs. Goal AUC 400-550. Expected AUC: 512.9 SCr used: 1.26  Pharmacy will continue to follow and will adjust abx dosing whenever warranted.  Temp (24hrs), Avg:98.3 F (36.8 C), Min:98.2 F (36.8 C), Max:98.4 F (36.9 C)   Recent Labs  Lab 12/17/23 2000 12/18/23 0045  WBC 6.2  --   CREATININE 1.26*  --   LATICACIDVEN 3.1* 2.1*    Estimated Creatinine Clearance: 69.3 mL/min (A) (by C-G formula based on SCr of 1.26 mg/dL (H)).    No Active Allergies  Antimicrobials this admission: 5/29 Ceftriaxone >> x 1 dose 5/30 Cefepime >> x 7 days 5/30 Vancomycin >> x 7 days  Microbiology results: 5/29 BCx: Pending  Thank you for allowing pharmacy to be a part of this patient's care.  Coretta Dexter, PharmD, MBA 12/18/2023 2:07 AM

## 2023-12-18 NOTE — Progress Notes (Signed)
 CHG bath complete.

## 2023-12-18 NOTE — Assessment & Plan Note (Signed)
 Will continue statin therapy

## 2023-12-18 NOTE — Assessment & Plan Note (Signed)
-   The patient was admitted to a medical-surgical bed. - Will continue antibiotic therapy with IV cefepime and vancomycin. - Warm compresses will be utilized. - Pain management will be provided.

## 2023-12-18 NOTE — Assessment & Plan Note (Signed)
-   Will continue Synthroid.

## 2023-12-18 NOTE — Evaluation (Signed)
 Occupational Therapy Evaluation Patient Details Name: Joshua [name] MRN: [id number] DOB: 1955/09/24 Today's Date: 12/18/2023   History of Present Illness   68 y.o. 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     Clinical Impressions Pt was seen for OT evaluation this date. Prior to hospital admission, pt was generally indep with ADL and IADL. Endorses using RW in the home, rollator to walk to the mailbox and keeps the rollator in the car, and primarily uses [facility] for short community distances. Pt lives with his german shepard in a 1 story home with ramped entrance. Endorses 1 fall in April leading to current R foot injury. Pt presents to acute OT demonstrating impaired ADL performance and functional mobility 2/2 RLE swelling, pain, and decr balance due to 5/10 pain with weightbearing (See OT problem list for additional functional deficits). Pt currently requires CGA for ADL mobility and increased time/effort to complete LB ADL tasks. Pt left with PT walking in the hall. Do not anticipate the need for follow up OT services upon acute hospital DC. Discharge in house.      If plan is discharge home, recommend the following:   Assistance with cooking/housework;Assist for transportation     Functional Status Assessment   Patient has had a recent decline in their functional status and demonstrates the ability to make significant improvements in function in a reasonable and predictable amount of time.     Equipment Recommendations   None recommended by OT     Recommendations for Other Services         Precautions/Restrictions   Precautions Precautions: Fall Recall of Precautions/Restrictions:  Intact Restrictions Weight Bearing Restrictions Per Provider Order: No     Mobility Bed Mobility Overal bed mobility: Modified Independent             General bed mobility comments: use of momentum and bed rails but no direct assist required    Transfers Overall transfer level: Needs assistance Equipment used: Rolling walker (2 wheels) Transfers: Sit to/from Stand Sit to Stand: Contact guard assist           General transfer comment: std height bed lower than his bed at home per pt report, used momentum to assist in standing, slow to rise but ultimately CGA      Balance Overall balance assessment: Needs assistance Sitting-balance support: No upper extremity supported, Feet supported Sitting balance-Leahy Scale: Good     Standing balance support: Bilateral upper extremity supported Standing balance-Leahy Scale: Good                             ADL either performed or assessed with clinical judgement   ADL Overall ADL's : Modified independent                                       General ADL Comments: Pt able to complete LB dressing with increased time/effort, CGA for ADL mobility wiht RW     Vision         Perception         Praxis         Pertinent Vitals/Pain Pain Assessment Pain  Assessment: 0-10 Pain Score: 5  Pain Location: R great toe Pain Descriptors / Indicators: Aching Pain Intervention(s): Limited activity within patient's tolerance, Monitored during session, Repositioned, Patient requesting pain meds-RN notified     Extremity/Trunk Assessment Upper Extremity Assessment Upper Extremity Assessment: Overall WFL for tasks assessed   Lower Extremity Assessment Lower Extremity Assessment: Defer to PT evaluation;RLE deficits/detail RLE Deficits / Details: R great toe red, swollen, pt noting improvement in swelling       Communication Communication Communication: No apparent difficulties   Cognition  Arousal: Alert Behavior During Therapy: WFL for tasks assessed/performed Cognition: No apparent impairments                               Following commands: Intact       Cueing  General Comments   Cueing Techniques: Verbal cues      Exercises     Shoulder Instructions      Home Living Family/patient expects to be discharged to:: Private residence Living Arrangements: Alone Available Help at Discharge: Family;Friend(s);Available PRN/intermittently (aunt in [place]) Type of Home: Mobile home Home Access: Ramped entrance     Home Layout: One level     Bathroom Shower/Tub: Tub/shower unit;Walk-in shower   Bathroom Toilet: Handicapped height     Home Equipment: Agricultural consultant (2 wheels);Wheelchair - manual;Shower seat;Grab bars - tub/shower;Hand held shower head;Cane - single point;Rollator (4 wheels);BSC/3in1          Prior Functioning/Environment Prior Level of Function : Driving;Independent/Modified Independent;History of Falls (last six months)             Mobility Comments: RW in the house with tray on top, SPC outside the home, rollator to/from the mailbox, keeps rollator in the car as needed, 1 fall in April when he got the toe injury ADLs Comments: mod indep, slow 2/2 R foot issues lately, drives    OT Problem List: Pain;Impaired balance (sitting and/or standing)   OT Treatment/Interventions:        OT Goals(Current goals can be found in the care plan section)   Acute Rehab OT Goals Patient Stated Goal: go home OT Goal Formulation: All assessment and education complete, DC therapy   OT Frequency:       Co-evaluation              AM-PAC OT "6 Clicks" Daily Activity     Outcome Measure Help from another person eating meals?: None Help from another person taking care of personal grooming?: None Help from another person toileting, which includes using toliet, bedpan, or urinal?: None Help from another person bathing  (including washing, rinsing, drying)?: None Help from another person to put on and taking off regular upper body clothing?: None Help from another person to put on and taking off regular lower body clothing?: None 6 Click Score: 24   End of Session Equipment Utilized During Treatment: Gait belt;Rolling walker (2 wheels) Nurse Communication: Mobility status;Patient requests pain meds  Activity Tolerance: Patient tolerated treatment well Patient left: Other (comment) (ambulating in hall with PT)  OT Visit Diagnosis: Unsteadiness on feet (R26.81);Pain Pain - Right/Left: Right Pain - part of body: Ankle and joints of foot                Time: 1191-4782 OT Time Calculation (min): 22 min Charges:  OT General Charges $OT Visit: 1 Visit OT Evaluation $OT Eval Low Complexity: 1 Low  Berenda Breaker., MPH, MS, OTR/L  ascom [phone] 12/18/23, 9:02 AM

## 2023-12-18 NOTE — TOC Initial Note (Signed)
 Transition of Care [facility]) - Initial/Assessment Note    Patient Details  Name: Joshua [name] MRN: [id number] Date of Birth: 1956/07/20  Transition of Care [facility]) CM/SW Contact:    Alexandra Ice, RN Phone Number: 12/18/2023, 11:57 AM  Clinical Narrative:                  Patient lives alone in mobile home. Home DME: RW, cane, rollator, grab-bars, shower chair, BSC. His home is ramp accessible. He has PCP in the community. No TOC needs identified at this time, will continue to follow.    Expected Discharge Plan: Home/Self Care Barriers to Discharge: Continued Medical Work up   Patient Goals and CMS Choice            Expected Discharge Plan and Services       Living arrangements for the past 2 months: Mobile Home (ramp accessible)                                      Prior Living Arrangements/Services Living arrangements for the past 2 months: Mobile Home (ramp accessible) Lives with:: Self Patient language and need for interpreter reviewed:: Yes Do you feel safe going back to the place where you live?: Yes      Need for Family Participation in Patient Care: Yes (Comment) Care giver support system in place?: Yes (comment) Current home services: DME (RW, cane, rollator, shower chair, grab-bars, BSC, ramp entrance.) Criminal Activity/Legal Involvement Pertinent to Current Situation/Hospitalization: No - Comment as needed  Activities of Daily Living   ADL Screening (condition at time of admission) Independently performs ADLs?: No Does the patient have a NEW difficulty with bathing/dressing/toileting/self-feeding that is expected to last >3 days?: Yes (Initiates electronic notice to provider for possible OT consult) Does the patient have a NEW difficulty with getting in/out of bed, walking, or climbing stairs that is expected to last >3 days?: Yes (Initiates electronic notice to provider for possible PT consult) Does the patient have a NEW difficulty  with communication that is expected to last >3 days?: No Is the patient deaf or have difficulty hearing?: No Does the patient have difficulty seeing, even when wearing glasses/contacts?: No Does the patient have difficulty concentrating, remembering, or making decisions?: No  Permission Sought/Granted                  Emotional Assessment Appearance:: Appears stated age Attitude/Demeanor/Rapport: Engaged Affect (typically observed): Appropriate Orientation: : Oriented to Self, Oriented to Place, Oriented to  Time, Oriented to Situation Alcohol / Substance Use: Not Applicable Psych Involvement: No (comment)  Admission diagnosis:  Cellulitis of right foot [L03.115] Acute sepsis [facility]) [A41.9] Patient Active Problem List   Diagnosis Date Noted   Hypothyroidism 12/18/2023   Seizure disorder (HCC) 12/18/2023   Dyslipidemia 12/18/2023   Cellulitis of right foot 12/17/2023   Elevated LFTs 12/02/2023   Swelling of right lower extremity 11/18/2023   Encounter for monoclonal antibody treatment for malignancy 11/04/2023   Cerebral edema (HCC) 08/22/2023   Neoplasm causing mass effect and brain compression on adjacent structures (HCC) 08/22/2023   Seizure (HCC) 08/21/2023   Elevated blood pressure reading in office without diagnosis of hypertension 06/02/2023   Avitaminosis D 06/02/2023   Benign localized prostatic hyperplasia without lower urinary tract symptoms (LUTS) 06/02/2023   Chemotherapy-induced neutropenia (HCC) 06/02/2023   Thrombocytopenia (HCC) 06/02/2023   Elevated serum creatinine 05/28/2023  COPD (chronic obstructive pulmonary disease) (HCC) 12/28/2021   Malignant neoplasm metastatic to brain (HCC) 10/08/2021   Hypothyroidism, unspecified 10/08/2021   Parotid nodule 08/16/2021   Organizing pneumonia (HCC) 04/04/2021   Hypothyroidism due to medication 04/04/2021   Encounter for antineoplastic immunotherapy 04/04/2021   Antineoplastic chemotherapy induced anemia  10/17/2020   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/20/2020   Iron deficiency anemia 08/30/2020   Cervical myelopathy (HCC) 03/14/2020   Peripheral neuropathy 10/14/2019   Chronic left-sided low back pain without sciatica 04/13/2019   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 [place] [address] Phone: [phone] Fax: [phone]     Social Drivers of Health (SDOH) Social History: SDOH Screenings   Food Insecurity: No Food Insecurity (12/18/2023)  Housing: Low Risk  (12/18/2023)  Transportation Needs: No Transportation Needs (12/18/2023)  Utilities: Not At Risk (12/18/2023)  Alcohol Screen: Low Risk  (04/16/2020)  Depression (PHQ2-9): Low Risk  (06/02/2023)  Financial Resource Strain: Low Risk  (07/27/2023)   Received from [facility]  Physical Activity: Insufficiently Active (05/29/2023)  Social Connections: Socially Isolated (12/18/2023)  Stress: No Stress Concern Present (05/29/2023)  Tobacco Use: Medium Risk (12/17/2023)   SDOH Interventions:     Readmission Risk Interventions    10/09/2021    3:42 PM  Readmission Risk Prevention Plan  Transportation Screening Complete  PCP or Specialist Appt within 5-7 Days Complete  Home Care Screening Complete  Medication Review (RN CM) Complete

## 2023-12-18 NOTE — Assessment & Plan Note (Signed)
Will resume Keppra

## 2023-12-19 DIAGNOSIS — L03115 Cellulitis of right lower limb: Secondary | ICD-10-CM | POA: Diagnosis not present

## 2023-12-19 LAB — URINALYSIS, W/ REFLEX TO CULTURE (INFECTION SUSPECTED)
Bilirubin Urine: NEGATIVE
Glucose, UA: NEGATIVE mg/dL
Ketones, ur: NEGATIVE mg/dL
Leukocytes,Ua: NEGATIVE
Nitrite: NEGATIVE
Protein, ur: NEGATIVE mg/dL
Specific Gravity, Urine: 1.004 — ABNORMAL LOW (ref 1.005–1.030)
pH: 6 (ref 5.0–8.0)

## 2023-12-19 LAB — BASIC METABOLIC PANEL WITH GFR
Anion gap: 7 (ref 5–15)
BUN: 8 mg/dL (ref 8–23)
CO2: 26 mmol/L (ref 22–32)
Calcium: 8.1 mg/dL — ABNORMAL LOW (ref 8.9–10.3)
Chloride: 106 mmol/L (ref 98–111)
Creatinine, Ser: 1.24 mg/dL (ref 0.61–1.24)
GFR, Estimated: >60 mL/min (ref >60)
Glucose, Bld: 155 mg/dL — ABNORMAL HIGH (ref 70–99)
Potassium: 2.9 mmol/L — ABNORMAL LOW (ref 3.5–5.1)
Sodium: 139 mmol/L (ref 135–145)

## 2023-12-19 LAB — CBC
HCT: 29.2 % — ABNORMAL LOW (ref 39.0–52.0)
Hemoglobin: 9.7 g/dL — ABNORMAL LOW (ref 13.0–17.0)
MCH: 29.3 pg (ref 26.0–34.0)
MCHC: 33.2 g/dL (ref 30.0–36.0)
MCV: 88.2 fL (ref 80.0–100.0)
Platelets: 193 10*3/uL (ref 150–400)
RBC: 3.31 MIL/uL — ABNORMAL LOW (ref 4.22–5.81)
RDW: 16.2 % — ABNORMAL HIGH (ref 11.5–15.5)
WBC: 3.8 10*3/uL — ABNORMAL LOW (ref 4.0–10.5)
nRBC: 0 % (ref 0.0–0.2)

## 2023-12-19 MED ORDER — POTASSIUM CHLORIDE CRYS ER 20 MEQ PO TBCR
40.0000 meq | EXTENDED_RELEASE_TABLET | Freq: Two times a day (BID) | ORAL | Status: AC
  Administered 2023-12-19 (×2): 40 meq via ORAL
  Filled 2023-12-19 (×2): qty 2

## 2023-12-19 MED ORDER — POLYVINYL ALCOHOL 1.4 % OP SOLN
1.0000 [drp] | OPHTHALMIC | Status: DC | PRN
  Administered 2023-12-19 – 2023-12-20 (×2): 1 [drp] via OPHTHALMIC
  Filled 2023-12-19: qty 15

## 2023-12-19 MED ORDER — NAPHAZOLINE-GLYCERIN 0.012-0.25 % OP SOLN: 1.0000 [drp] | Freq: Four times a day (QID) | OPHTHALMIC | Status: DC | PRN

## 2023-12-19 NOTE — Progress Notes (Signed)
 CHG bath complete.

## 2023-12-19 NOTE — Progress Notes (Signed)
K 2.9 MD notified

## 2023-12-19 NOTE — Progress Notes (Signed)
 PROGRESS NOTE    Joshua [name]  RUE:[id number] DOB: [birth year] DOA: 12/17/2023 PCP: Delmus Ferri, MD    Assessment & Plan:   Principal Problem:   Cellulitis of right foot Active Problems:   PVD (peripheral vascular disease) (HCC)   Hypothyroidism   Seizure disorder (HCC)   Dyslipidemia  Assessment and Plan: Cellulitis of right foot: improving. Continue on IV cefepime , vanco. Will continue to monitor Cr while on vanco. No osteomyelitis noted on XR  PVD: continue on aspirin , plavix    Normocytic anemia: will transfuse if Hb < 7.0    HLD: continue on statin   Hypokalemia: KCl repleted    Seizure disorder: continue on home dose of keppra     Hypothyroidism: continue on home dose of levothyroxine    PreDM: HbA1c 6.3 in 05/2023. Encourage healthy diet     DVT prophylaxis: lovenox   Code Status: full  Family Communication:  Disposition Plan: likely d/c back home  Level of care: Med-Surg  Status is: Inpatient Remains inpatient appropriate because: severity of illness     Consultants:    Procedures:   Antimicrobials: cefepime , vanco    Subjective: Pt c/o malaise   Objective: Vitals:   12/18/23 1615 12/18/23 1946 12/19/23 0352 12/19/23 0801  BP: (!) 123/59 131/71 111/60 122/64  Pulse: 80 86 82 76  Resp: 16 19 18 18   Temp: 98 F (36.7 C) 98.5 F (36.9 C) 97.6 F (36.4 C) 97.7 F (36.5 C)  TempSrc: Oral Oral  Oral  SpO2: 99% 98% 96% 95%  Weight:        Intake/Output Summary (Last 24 hours) at 12/19/2023 0817 Last data filed at 12/19/2023 0413 Gross per 24 hour  Intake 1456.04 ml  Output 2000 ml  Net -543.96 ml   Filed Weights   12/18/23 0100 12/18/23 0201  Weight: 96.3 kg 96.6 kg    Examination:  General exam: Appears calm & comfortable  Respiratory system: clear breath sounds b/l  Cardiovascular system: S1/S2+. No rubs or clicks  Gastrointestinal system: Abd is soft, NT, ND & hypoactive bowel sounds Central nervous system:  alert & oriented. Moves all extremities  Skin: improved right foot edema, erythema & tender to palpation  Psychiatry: Judgement and insight appears normal. Appropriate mood and affect     Data Reviewed: I have personally reviewed following labs and imaging studies  CBC: Recent Labs  Lab 12/17/23 2000 12/18/23 0235 12/19/23 0438  WBC 6.2 5.3 3.8*  NEUTROABS 4.7  --   --   HGB 12.9* 10.2* 9.7*  HCT 40.0 30.1* 29.2*  MCV 88.9 85.3 88.2  PLT 282 199 193   Basic Metabolic Panel: Recent Labs  Lab 12/17/23 2000 12/18/23 0235 12/19/23 0438  NA 136 137 139  K 3.1* 3.1* 2.9*  CL 99 102 106  CO2 21* 27 26  GLUCOSE 110* 98 155*  BUN 10 10 8   CREATININE 1.26* 1.11 1.24  CALCIUM  8.9 8.3* 8.1*   GFR: Estimated Creatinine Clearance: 70.4 mL/min (by C-G formula based on SCr of 1.24 mg/dL). Liver Function Tests: Recent Labs  Lab 12/17/23 2000  AST 33  ALT 23  ALKPHOS 72  BILITOT 1.4*  PROT 7.5  ALBUMIN  3.6   No results for input(s): "LIPASE", "AMYLASE" in the last 168 hours. No results for input(s): "AMMONIA" in the last 168 hours. Coagulation Profile: Recent Labs  Lab 12/17/23 2000  INR 1.1   Cardiac Enzymes: No results for input(s): "CKTOTAL", "CKMB", "CKMBINDEX", "TROPONINI" in the last 168 hours. BNP (last  3 results) No results for input(s): "PROBNP" in the last 8760 hours. HbA1C: No results for input(s): "HGBA1C" in the last 72 hours. CBG: No results for input(s): "GLUCAP" in the last 168 hours. Lipid Profile: No results for input(s): "CHOL", "HDL", "LDLCALC", "TRIG", "CHOLHDL", "LDLDIRECT" in the last 72 hours. Thyroid  Function Tests: No results for input(s): "TSH", "T4TOTAL", "FREET4", "T3FREE", "THYROIDAB" in the last 72 hours. Anemia Panel: No results for input(s): "VITAMINB12", "FOLATE", "FERRITIN", "TIBC", "IRON", "RETICCTPCT" in the last 72 hours. Sepsis Labs: Recent Labs  Lab 12/17/23 2000 12/18/23 0045  LATICACIDVEN 3.1* 2.1*    Recent  Results (from the past 240 hours)  Culture, blood (Routine x 2)     Status: None (Preliminary result)   Collection Time: 12/17/23  8:00 PM   Specimen: BLOOD  Result Value Ref Range Status   Specimen Description BLOOD BLOOD LEFT ARM  Final   Special Requests   Final    BOTTLES DRAWN AEROBIC AND ANAEROBIC Blood Culture results may not be optimal due to an inadequate volume of blood received in culture bottles   Culture   Final    NO GROWTH < 12 HOURS Performed at [facility], [address], [place], [place] [address]    Report Status PENDING  Incomplete  Culture, blood (Routine x 2)     Status: None (Preliminary result)   Collection Time: 12/17/23 11:28 PM   Specimen: BLOOD  Result Value Ref Range Status   Specimen Description BLOOD LEFT ARM  Final   Special Requests   Final    BOTTLES DRAWN AEROBIC AND ANAEROBIC Blood Culture adequate volume   Culture   Final    NO GROWTH < 12 HOURS Performed at [facility], [address], [place], [place] [address]    Report Status PENDING  Incomplete  Resp panel by RT-PCR (RSV, Flu A&B, Covid) Anterior Nasal Swab     Status: None   Collection Time: 12/17/23 11:28 PM   Specimen: Anterior Nasal Swab  Result Value Ref Range Status   SARS Coronavirus 2 by RT PCR NEGATIVE NEGATIVE Final    Comment: (NOTE) SARS-CoV-2 target nucleic acids are NOT DETECTED.  The SARS-CoV-2 RNA is generally detectable in upper respiratory specimens during the acute phase of infection. The lowest concentration of SARS-CoV-2 viral copies this assay can detect is 138 copies/mL. A negative result does not preclude SARS-Cov-2 infection and should not be used as the sole basis for treatment or other patient management decisions. A negative result may occur with  improper specimen collection/handling, submission of specimen other than nasopharyngeal swab, presence of viral mutation(s) within the areas targeted by this assay, and inadequate number of  viral copies(<138 copies/mL). A negative result must be combined with clinical observations, patient history, and epidemiological information. The expected result is Negative.  Fact Sheet for Patients:  BloggerCourse.com  Fact Sheet for Healthcare Providers:  SeriousBroker.it  This test is no t yet approved or cleared by the [place]  FDA and  has been authorized for detection and/or diagnosis of SARS-CoV-2 by FDA under an Emergency Use Authorization (EUA). This EUA will remain  in effect (meaning this test can be used) for the duration of the COVID-19 declaration under Section 564(b)(1) of the Act, 21 U.S.C.section 360bbb-3(b)(1), unless the authorization is terminated  or revoked sooner.       Influenza A by PCR NEGATIVE NEGATIVE Final   Influenza B by PCR NEGATIVE NEGATIVE Final    Comment: (NOTE) The Xpert Xpress SARS-CoV-2/FLU/RSV  plus assay is intended as an aid in the diagnosis of influenza from Nasopharyngeal swab specimens and should not be used as a sole basis for treatment. Nasal washings and aspirates are unacceptable for Xpert Xpress SARS-CoV-2/FLU/RSV testing.  Fact Sheet for Patients: BloggerCourse.com  Fact Sheet for Healthcare Providers: SeriousBroker.it  This test is not yet approved or cleared by the [place]  FDA and has been authorized for detection and/or diagnosis of SARS-CoV-2 by FDA under an Emergency Use Authorization (EUA). This EUA will remain in effect (meaning this test can be used) for the duration of the COVID-19 declaration under Section 564(b)(1) of the Act, 21 U.S.C. section 360bbb-3(b)(1), unless the authorization is terminated or revoked.     Resp Syncytial Virus by PCR NEGATIVE NEGATIVE Final    Comment: (NOTE) Fact Sheet for Patients: BloggerCourse.com  Fact Sheet for Healthcare  Providers: SeriousBroker.it  This test is not yet approved or cleared by the [place]  FDA and has been authorized for detection and/or diagnosis of SARS-CoV-2 by FDA under an Emergency Use Authorization (EUA). This EUA will remain in effect (meaning this test can be used) for the duration of the COVID-19 declaration under Section 564(b)(1) of the Act, 21 U.S.C. section 360bbb-3(b)(1), unless the authorization is terminated or revoked.  Performed at [facility], [address] [place], [place] [address]          Radiology Studies: DG Foot 2 Views Right Result Date: 12/17/2023 CLINICAL DATA:  Cellulitis. Right foot pain for 1 month. Wound to big toe. Redness and swelling. EXAM: RIGHT FOOT - 2 VIEW COMPARISON:  None Available. FINDINGS: Generalized soft tissue edema. Site of wound is not well delineated by radiograph. No erosive or bony destructive change. No fracture. Mild degenerative change of the first metatarsal phalangeal joint. No soft tissue gas or radiopaque foreign body. Small plantar calcaneal spur. IMPRESSION: 1. Generalized soft tissue edema. No radiographic evidence of osteomyelitis. 2. Mild degenerative change of the first metatarsophalangeal joint. Electronically Signed   By: Chadwick Colonel M.D.   On: 12/17/2023 23:29   DG Chest 2 View Result Date: 12/17/2023 CLINICAL DATA:  Suspected sepsis EXAM: CHEST - 2 VIEW COMPARISON:  02/10/2022 FINDINGS: Right Port-A-Cath remains in place, unchanged. Heart mediastinal contours within normal limits. No acute confluent airspace opacities or effusions. Post treatment scarring noted in the right suprahilar region, unchanged. No acute bony abnormality. IMPRESSION: No active cardiopulmonary disease. Electronically Signed   By: Janeece Mechanic M.D.   On: 12/17/2023 21:05        Scheduled Meds:  aspirin  EC  81 mg Oral Daily   clopidogrel   75 mg Oral Daily   enoxaparin  (LOVENOX ) injection  40 mg  Subcutaneous Q24H   levETIRAcetam   1,000 mg Oral BID   levothyroxine   125 mcg Oral Q0600   potassium chloride   40 mEq Oral BID   rosuvastatin   40 mg Oral Daily   Continuous Infusions:  ceFEPime  (MAXIPIME ) IV 2 g (12/19/23 0547)   vancomycin  Stopped (12/19/23 0046)     LOS: 2 days      Alphonsus Jeans, MD [facility] Pager [phone]  If 7PM-7AM, please contact night-coverage [email or web address] 12/19/2023, 8:17 AM

## 2023-12-20 DIAGNOSIS — L03115 Cellulitis of right lower limb: Secondary | ICD-10-CM | POA: Diagnosis not present

## 2023-12-20 LAB — CBC
HCT: 32.3 % — ABNORMAL LOW (ref 39.0–52.0)
Hemoglobin: 10.4 g/dL — ABNORMAL LOW (ref 13.0–17.0)
MCH: 28.7 pg (ref 26.0–34.0)
MCHC: 32.2 g/dL (ref 30.0–36.0)
MCV: 89.2 fL (ref 80.0–100.0)
Platelets: 218 10*3/uL (ref 150–400)
RBC: 3.62 MIL/uL — ABNORMAL LOW (ref 4.22–5.81)
RDW: 16 % — ABNORMAL HIGH (ref 11.5–15.5)
WBC: 4.4 10*3/uL (ref 4.0–10.5)
nRBC: 0 % (ref 0.0–0.2)

## 2023-12-20 LAB — BASIC METABOLIC PANEL WITH GFR
Anion gap: 8 (ref 5–15)
BUN: <5 mg/dL — ABNORMAL LOW (ref 8–23)
CO2: 25 mmol/L (ref 22–32)
Calcium: 8.4 mg/dL — ABNORMAL LOW (ref 8.9–10.3)
Chloride: 109 mmol/L (ref 98–111)
Creatinine, Ser: 0.99 mg/dL (ref 0.61–1.24)
GFR, Estimated: >60 mL/min (ref >60)
Glucose, Bld: 102 mg/dL — ABNORMAL HIGH (ref 70–99)
Potassium: 3.4 mmol/L — ABNORMAL LOW (ref 3.5–5.1)
Sodium: 142 mmol/L (ref 135–145)

## 2023-12-20 MED ORDER — POTASSIUM CHLORIDE CRYS ER 20 MEQ PO TBCR
40.0000 meq | EXTENDED_RELEASE_TABLET | Freq: Once | ORAL | Status: AC
  Administered 2023-12-20: 40 meq via ORAL
  Filled 2023-12-20: qty 2

## 2023-12-20 MED ORDER — SODIUM CHLORIDE 0.9% FLUSH
10.0000 mL | Freq: Two times a day (BID) | INTRAVENOUS | Status: DC
  Administered 2023-12-20 – 2023-12-22 (×4): 10 mL

## 2023-12-20 MED ORDER — SODIUM CHLORIDE 0.9% FLUSH: 10.0000 mL | INTRAVENOUS | Status: DC | PRN

## 2023-12-20 MED ORDER — CHLORHEXIDINE GLUCONATE CLOTH 2 % EX PADS
6.0000 | MEDICATED_PAD | Freq: Every day | CUTANEOUS | Status: DC
  Administered 2023-12-20 – 2023-12-22 (×3): 6 via TOPICAL

## 2023-12-20 NOTE — Plan of Care (Signed)

## 2023-12-20 NOTE — Progress Notes (Signed)
 PROGRESS NOTE    Joshua [name]  WUJ:[id number] DOB: 12-01-1955 DOA: 12/17/2023 PCP: Delmus Ferri, MD    Assessment & Plan:   Principal Problem:   Cellulitis of right foot Active Problems:   PVD (peripheral vascular disease) (HCC)   Hypothyroidism   Seizure disorder (HCC)   Dyslipidemia  Assessment and Plan: Cellulitis of right foot: slowly improving. Continue on IV vanco, cefepime . Continue to monitor Cr while on vanco. No osteomyelitis noted on XR  PVD: continue on plavix , aspirin    Normocytic anemia: H&H are trending up. No need for a transfusion currently    HLD:  continue on statin   Hypokalemia: potassium given    Seizure disorder: continue on home dose of keppra     Hypothyroidism: continue on home dose of levothyroxine    PreDM: HbA1c 6.3 in 05/2023. Encourage healthy diet     DVT prophylaxis: lovenox   Code Status: full  Family Communication:  Disposition Plan: likely d/c back home  Level of care: Med-Surg  Status is: Inpatient Remains inpatient appropriate because: severity of illness     Consultants:    Procedures:   Antimicrobials: cefepime , vanco    Subjective: Pt c/o itchy eyes   Objective: Vitals:   12/19/23 0352 12/19/23 0801 12/19/23 1554 12/19/23 2026  BP: 111/60 122/64 130/64 129/68  Pulse: 82 76 80 80  Resp: 18 18 16 16   Temp: 97.6 F (36.4 C) 97.7 F (36.5 C) 98 F (36.7 C) 98.2 F (36.8 C)  TempSrc:  Oral Oral Oral  SpO2: 96% 95% 99% 99%  Weight:        Intake/Output Summary (Last 24 hours) at 12/20/2023 0800 Last data filed at 12/20/2023 9562 Gross per 24 hour  Intake 720 ml  Output 2825 ml  Net -2105 ml   Filed Weights   12/18/23 0100 12/18/23 0201  Weight: 96.3 kg 96.6 kg    Examination:  General exam: Appears comfortable  Respiratory system: clear breath sounds b/l  Cardiovascular system: S1 & S2+. No rubs or clicks  Gastrointestinal system: abd is soft, NT, ND & hypoactive bowel sounds   Central nervous system: alert & oriented. Moves all extremities  Skin: erythema, edema & tenderness to palpation of right foot  Psychiatry: Judgement and insight appears at baseline. Flat mood and affect    Data Reviewed: I have personally reviewed following labs and imaging studies  CBC: Recent Labs  Lab 12/17/23 2000 12/18/23 0235 12/19/23 0438 12/20/23 0421  WBC 6.2 5.3 3.8* 4.4  NEUTROABS 4.7  --   --   --   HGB 12.9* 10.2* 9.7* 10.4*  HCT 40.0 30.1* 29.2* 32.3*  MCV 88.9 85.3 88.2 89.2  PLT 282 199 193 218   Basic Metabolic Panel: Recent Labs  Lab 12/17/23 2000 12/18/23 0235 12/19/23 0438 12/20/23 0421  NA 136 137 139 142  K 3.1* 3.1* 2.9* 3.4*  CL 99 102 106 109  CO2 21* 27 26 25   GLUCOSE 110* 98 155* 102*  BUN 10 10 8  <5*  CREATININE 1.26* 1.11 1.24 0.99  CALCIUM  8.9 8.3* 8.1* 8.4*   GFR: Estimated Creatinine Clearance: 88.2 mL/min (by C-G formula based on SCr of 0.99 mg/dL). Liver Function Tests: Recent Labs  Lab 12/17/23 2000  AST 33  ALT 23  ALKPHOS 72  BILITOT 1.4*  PROT 7.5  ALBUMIN  3.6   No results for input(s): "LIPASE", "AMYLASE" in the last 168 hours. No results for input(s): "AMMONIA" in the last 168 hours. Coagulation Profile: Recent Labs  Lab 12/17/23 2000  INR 1.1   Cardiac Enzymes: No results for input(s): "CKTOTAL", "CKMB", "CKMBINDEX", "TROPONINI" in the last 168 hours. BNP (last 3 results) No results for input(s): "PROBNP" in the last 8760 hours. HbA1C: No results for input(s): "HGBA1C" in the last 72 hours. CBG: No results for input(s): "GLUCAP" in the last 168 hours. Lipid Profile: No results for input(s): "CHOL", "HDL", "LDLCALC", "TRIG", "CHOLHDL", "LDLDIRECT" in the last 72 hours. Thyroid  Function Tests: No results for input(s): "TSH", "T4TOTAL", "FREET4", "T3FREE", "THYROIDAB" in the last 72 hours. Anemia Panel: No results for input(s): "VITAMINB12", "FOLATE", "FERRITIN", "TIBC", "IRON", "RETICCTPCT" in the last  72 hours. Sepsis Labs: Recent Labs  Lab 12/17/23 2000 12/18/23 0045  LATICACIDVEN 3.1* 2.1*    Recent Results (from the past 240 hours)  Culture, blood (Routine x 2)     Status: None (Preliminary result)   Collection Time: 12/17/23  8:00 PM   Specimen: BLOOD  Result Value Ref Range Status   Specimen Description BLOOD BLOOD LEFT ARM  Final   Special Requests   Final    BOTTLES DRAWN AEROBIC AND ANAEROBIC Blood Culture results may not be optimal due to an inadequate volume of blood received in culture bottles   Culture   Final    NO GROWTH 3 DAYS Performed at [facility], [address] [place], [place] [address]    Report Status PENDING  Incomplete  Culture, blood (Routine x 2)     Status: None (Preliminary result)   Collection Time: 12/17/23 11:28 PM   Specimen: BLOOD  Result Value Ref Range Status   Specimen Description BLOOD LEFT ARM  Final   Special Requests   Final    BOTTLES DRAWN AEROBIC AND ANAEROBIC Blood Culture adequate volume   Culture   Final    NO GROWTH 2 DAYS Performed at [facility], [address], [place], [place] [address]    Report Status PENDING  Incomplete  Resp panel by RT-PCR (RSV, Flu A&B, Covid) Anterior Nasal Swab     Status: None   Collection Time: 12/17/23 11:28 PM   Specimen: Anterior Nasal Swab  Result Value Ref Range Status   SARS Coronavirus 2 by RT PCR NEGATIVE NEGATIVE Final    Comment: (NOTE) SARS-CoV-2 target nucleic acids are NOT DETECTED.  The SARS-CoV-2 RNA is generally detectable in upper respiratory specimens during the acute phase of infection. The lowest concentration of SARS-CoV-2 viral copies this assay can detect is 138 copies/mL. A negative result does not preclude SARS-Cov-2 infection and should not be used as the sole basis for treatment or other patient management decisions. A negative result may occur with  improper specimen collection/handling, submission of specimen other than nasopharyngeal  swab, presence of viral mutation(s) within the areas targeted by this assay, and inadequate number of viral copies(<138 copies/mL). A negative result must be combined with clinical observations, patient history, and epidemiological information. The expected result is Negative.  Fact Sheet for Patients:  BloggerCourse.com  Fact Sheet for Healthcare Providers:  SeriousBroker.it  This test is no t yet approved or cleared by the [place]  FDA and  has been authorized for detection and/or diagnosis of SARS-CoV-2 by FDA under an Emergency Use Authorization (EUA). This EUA will remain  in effect (meaning this test can be used) for the duration of the COVID-19 declaration under Section 564(b)(1) of the Act, 21 U.S.C.section 360bbb-3(b)(1), unless the authorization is terminated  or revoked sooner.       Influenza A  by PCR NEGATIVE NEGATIVE Final   Influenza B by PCR NEGATIVE NEGATIVE Final    Comment: (NOTE) The Xpert Xpress SARS-CoV-2/FLU/RSV plus assay is intended as an aid in the diagnosis of influenza from Nasopharyngeal swab specimens and should not be used as a sole basis for treatment. Nasal washings and aspirates are unacceptable for Xpert Xpress SARS-CoV-2/FLU/RSV testing.  Fact Sheet for Patients: BloggerCourse.com  Fact Sheet for Healthcare Providers: SeriousBroker.it  This test is not yet approved or cleared by the [place]  FDA and has been authorized for detection and/or diagnosis of SARS-CoV-2 by FDA under an Emergency Use Authorization (EUA). This EUA will remain in effect (meaning this test can be used) for the duration of the COVID-19 declaration under Section 564(b)(1) of the Act, 21 U.S.C. section 360bbb-3(b)(1), unless the authorization is terminated or revoked.     Resp Syncytial Virus by PCR NEGATIVE NEGATIVE Final    Comment: (NOTE) Fact Sheet for  Patients: BloggerCourse.com  Fact Sheet for Healthcare Providers: SeriousBroker.it  This test is not yet approved or cleared by the [place]  FDA and has been authorized for detection and/or diagnosis of SARS-CoV-2 by FDA under an Emergency Use Authorization (EUA). This EUA will remain in effect (meaning this test can be used) for the duration of the COVID-19 declaration under Section 564(b)(1) of the Act, 21 U.S.C. section 360bbb-3(b)(1), unless the authorization is terminated or revoked.  Performed at [facility], [address] [place], [place] [address]          Radiology Studies: No results found.       Scheduled Meds:  aspirin  EC  81 mg Oral Daily   clopidogrel   75 mg Oral Daily   enoxaparin  (LOVENOX ) injection  40 mg Subcutaneous Q24H   levETIRAcetam   1,000 mg Oral BID   levothyroxine   125 mcg Oral Q0600   rosuvastatin   40 mg Oral Daily   Continuous Infusions:  ceFEPime  (MAXIPIME ) IV 2 g (12/20/23 0638)   vancomycin  1,000 mg (12/20/23 0001)     LOS: 3 days      Alphonsus Jeans, MD [facility] Pager [phone]  If 7PM-7AM, please contact night-coverage [email or web address] 12/20/2023, 8:00 AM

## 2023-12-21 ENCOUNTER — Ambulatory Visit (HOSPITAL_COMMUNITY)

## 2023-12-21 DIAGNOSIS — L03115 Cellulitis of right lower limb: Secondary | ICD-10-CM | POA: Diagnosis not present

## 2023-12-21 LAB — CBC
HCT: 32 % — ABNORMAL LOW (ref 39.0–52.0)
Hemoglobin: 10.4 g/dL — ABNORMAL LOW (ref 13.0–17.0)
MCH: 29.1 pg (ref 26.0–34.0)
MCHC: 32.5 g/dL (ref 30.0–36.0)
MCV: 89.4 fL (ref 80.0–100.0)
Platelets: 215 10*3/uL (ref 150–400)
RBC: 3.58 MIL/uL — ABNORMAL LOW (ref 4.22–5.81)
RDW: 16.3 % — ABNORMAL HIGH (ref 11.5–15.5)
WBC: 4.1 10*3/uL (ref 4.0–10.5)
nRBC: 0 % (ref 0.0–0.2)

## 2023-12-21 LAB — BASIC METABOLIC PANEL WITH GFR
Anion gap: 5 (ref 5–15)
BUN: 6 mg/dL — ABNORMAL LOW (ref 8–23)
CO2: 27 mmol/L (ref 22–32)
Calcium: 8.5 mg/dL — ABNORMAL LOW (ref 8.9–10.3)
Chloride: 108 mmol/L (ref 98–111)
Creatinine, Ser: 1.06 mg/dL (ref 0.61–1.24)
GFR, Estimated: >60 mL/min (ref >60)
Glucose, Bld: 117 mg/dL — ABNORMAL HIGH (ref 70–99)
Potassium: 3.3 mmol/L — ABNORMAL LOW (ref 3.5–5.1)
Sodium: 140 mmol/L (ref 135–145)

## 2023-12-21 MED ORDER — POTASSIUM CHLORIDE CRYS ER 20 MEQ PO TBCR
40.0000 meq | EXTENDED_RELEASE_TABLET | Freq: Once | ORAL | Status: AC
  Administered 2023-12-21: 40 meq via ORAL
  Filled 2023-12-21: qty 2

## 2023-12-21 MED ORDER — FUROSEMIDE 10 MG/ML IJ SOLN
40.0000 mg | Freq: Once | INTRAMUSCULAR | Status: AC
  Administered 2023-12-21: 40 mg via INTRAVENOUS
  Filled 2023-12-21: qty 4

## 2023-12-21 MED ORDER — FUROSEMIDE 10 MG/ML IJ SOLN: 20.0000 mg | Freq: Once | INTRAMUSCULAR | Status: DC

## 2023-12-21 MED ORDER — DOCUSATE SODIUM 100 MG PO CAPS
100.0000 mg | ORAL_CAPSULE | Freq: Two times a day (BID) | ORAL | Status: DC | PRN
  Administered 2023-12-21: 100 mg via ORAL
  Filled 2023-12-21: qty 1

## 2023-12-21 NOTE — Progress Notes (Signed)
 PROGRESS NOTE    Joshua [name]  FAO:[id number] DOB: [birth year] DOA: 12/17/2023 PCP: Delmus Ferri, MD    Assessment & Plan:   Principal Problem:   Cellulitis of right foot Active Problems:   PVD (peripheral vascular disease) (HCC)   Hypothyroidism   Seizure disorder (HCC)   Dyslipidemia  Assessment and Plan: Cellulitis of right foot: slowing improving. Continue on IV vanco, cefepime . Continue to monitor Cr while on vanco. No osteomyelitis noted on XR  PVD: continue on aspirin , plavix    Normocytic anemia: H&H are stable. No need for a transfusion currently    HLD:  continue on statin   Hypokalemia: KCl repleted    Seizure disorder:  continue on home dose of keppra     Hypothyroidism: continue on home dose of levothyroxine    PreDM: HbA1c 6.3 in 05/2023. Encourage a healthy diet     DVT prophylaxis: lovenox   Code Status: full  Family Communication:  Disposition Plan: likely d/c back home  Level of care: Med-Surg  Status is: Inpatient Remains inpatient appropriate because: severity of illness     Consultants:    Procedures:   Antimicrobials: cefepime , vanco    Subjective: Pt c/o right foot pain   Objective: Vitals:   12/20/23 1651 12/20/23 1958 12/21/23 0530 12/21/23 0738  BP: 131/77 135/69 120/66 (!) 118/59  Pulse: 81 90 77 80  Resp: 15 17 18 15   Temp: 98.4 F (36.9 C) 98.2 F (36.8 C) 97.6 F (36.4 C) 97.9 F (36.6 C)  TempSrc: Oral  Oral   SpO2: 98% 100% 98% 97%  Weight:        Intake/Output Summary (Last 24 hours) at 12/21/2023 0836 Last data filed at 12/21/2023 0536 Gross per 24 hour  Intake 250 ml  Output 1600 ml  Net -1350 ml   Filed Weights   12/18/23 0100 12/18/23 0201  Weight: 96.3 kg 96.6 kg    Examination:  General exam: appears comfortable  Respiratory system: clear breath sounds b/l  Cardiovascular system: S1/S2+. No rubs or clicks Gastrointestinal system: abd is soft, NT, ND & hypoactive  Central nervous  system: alert & oriented. Moves all extremities  Skin: erythema, edema of right foot  Psychiatry: Judgement and insight appears at baseline. Flat mood and affect    Data Reviewed: I have personally reviewed following labs and imaging studies  CBC: Recent Labs  Lab 12/17/23 2000 12/18/23 0235 12/19/23 0438 12/20/23 0421 12/21/23 0214  WBC 6.2 5.3 3.8* 4.4 4.1  NEUTROABS 4.7  --   --   --   --   HGB 12.9* 10.2* 9.7* 10.4* 10.4*  HCT 40.0 30.1* 29.2* 32.3* 32.0*  MCV 88.9 85.3 88.2 89.2 89.4  PLT 282 199 193 218 215   Basic Metabolic Panel: Recent Labs  Lab 12/17/23 2000 12/18/23 0235 12/19/23 0438 12/20/23 0421 12/21/23 0214  NA 136 137 139 142 140  K 3.1* 3.1* 2.9* 3.4* 3.3*  CL 99 102 106 109 108  CO2 21* 27 26 25 27   GLUCOSE 110* 98 155* 102* 117*  BUN 10 10 8  <5* 6*  CREATININE 1.26* 1.11 1.24 0.99 1.06  CALCIUM  8.9 8.3* 8.1* 8.4* 8.5*   GFR: Estimated Creatinine Clearance: 82.4 mL/min (by C-G formula based on SCr of 1.06 mg/dL). Liver Function Tests: Recent Labs  Lab 12/17/23 2000  AST 33  ALT 23  ALKPHOS 72  BILITOT 1.4*  PROT 7.5  ALBUMIN  3.6   No results for input(s): "LIPASE", "AMYLASE" in the last 168 hours.  No results for input(s): "AMMONIA" in the last 168 hours. Coagulation Profile: Recent Labs  Lab 12/17/23 2000  INR 1.1   Cardiac Enzymes: No results for input(s): "CKTOTAL", "CKMB", "CKMBINDEX", "TROPONINI" in the last 168 hours. BNP (last 3 results) No results for input(s): "PROBNP" in the last 8760 hours. HbA1C: No results for input(s): "HGBA1C" in the last 72 hours. CBG: No results for input(s): "GLUCAP" in the last 168 hours. Lipid Profile: No results for input(s): "CHOL", "HDL", "LDLCALC", "TRIG", "CHOLHDL", "LDLDIRECT" in the last 72 hours. Thyroid  Function Tests: No results for input(s): "TSH", "T4TOTAL", "FREET4", "T3FREE", "THYROIDAB" in the last 72 hours. Anemia Panel: No results for input(s): "VITAMINB12", "FOLATE",  "FERRITIN", "TIBC", "IRON", "RETICCTPCT" in the last 72 hours. Sepsis Labs: Recent Labs  Lab 12/17/23 2000 12/18/23 0045  LATICACIDVEN 3.1* 2.1*    Recent Results (from the past 240 hours)  Culture, blood (Routine x 2)     Status: None (Preliminary result)   Collection Time: 12/17/23  8:00 PM   Specimen: BLOOD  Result Value Ref Range Status   Specimen Description BLOOD BLOOD LEFT ARM  Final   Special Requests   Final    BOTTLES DRAWN AEROBIC AND ANAEROBIC Blood Culture results may not be optimal due to an inadequate volume of blood received in culture bottles   Culture   Final    NO GROWTH 3 DAYS Performed at [facility], [address], [place], [place] [address]    Report Status PENDING  Incomplete  Culture, blood (Routine x 2)     Status: None (Preliminary result)   Collection Time: 12/17/23 11:28 PM   Specimen: BLOOD  Result Value Ref Range Status   Specimen Description BLOOD LEFT ARM  Final   Special Requests   Final    BOTTLES DRAWN AEROBIC AND ANAEROBIC Blood Culture adequate volume   Culture   Final    NO GROWTH 2 DAYS Performed at [facility], [address] [place], [place] [address]    Report Status PENDING  Incomplete  Resp panel by RT-PCR (RSV, Flu A&B, Covid) Anterior Nasal Swab     Status: None   Collection Time: 12/17/23 11:28 PM   Specimen: Anterior Nasal Swab  Result Value Ref Range Status   SARS Coronavirus 2 by RT PCR NEGATIVE NEGATIVE Final    Comment: (NOTE) SARS-CoV-2 target nucleic acids are NOT DETECTED.  The SARS-CoV-2 RNA is generally detectable in upper respiratory specimens during the acute phase of infection. The lowest concentration of SARS-CoV-2 viral copies this assay can detect is 138 copies/mL. A negative result does not preclude SARS-Cov-2 infection and should not be used as the sole basis for treatment or other patient management decisions. A negative result may occur with  improper specimen  collection/handling, submission of specimen other than nasopharyngeal swab, presence of viral mutation(s) within the areas targeted by this assay, and inadequate number of viral copies(<138 copies/mL). A negative result must be combined with clinical observations, patient history, and epidemiological information. The expected result is Negative.  Fact Sheet for Patients:  BloggerCourse.com  Fact Sheet for Healthcare Providers:  SeriousBroker.it  This test is no t yet approved or cleared by the [place]  FDA and  has been authorized for detection and/or diagnosis of SARS-CoV-2 by FDA under an Emergency Use Authorization (EUA). This EUA will remain  in effect (meaning this test can be used) for the duration of the COVID-19 declaration under Section 564(b)(1) of the Act, 21 U.S.C.section 360bbb-3(b)(1), unless the  authorization is terminated  or revoked sooner.       Influenza A by PCR NEGATIVE NEGATIVE Final   Influenza B by PCR NEGATIVE NEGATIVE Final    Comment: (NOTE) The Xpert Xpress SARS-CoV-2/FLU/RSV plus assay is intended as an aid in the diagnosis of influenza from Nasopharyngeal swab specimens and should not be used as a sole basis for treatment. Nasal washings and aspirates are unacceptable for Xpert Xpress SARS-CoV-2/FLU/RSV testing.  Fact Sheet for Patients: BloggerCourse.com  Fact Sheet for Healthcare Providers: SeriousBroker.it  This test is not yet approved or cleared by the [place]  FDA and has been authorized for detection and/or diagnosis of SARS-CoV-2 by FDA under an Emergency Use Authorization (EUA). This EUA will remain in effect (meaning this test can be used) for the duration of the COVID-19 declaration under Section 564(b)(1) of the Act, 21 U.S.C. section 360bbb-3(b)(1), unless the authorization is terminated or revoked.     Resp Syncytial  Virus by PCR NEGATIVE NEGATIVE Final    Comment: (NOTE) Fact Sheet for Patients: BloggerCourse.com  Fact Sheet for Healthcare Providers: SeriousBroker.it  This test is not yet approved or cleared by the [place]  FDA and has been authorized for detection and/or diagnosis of SARS-CoV-2 by FDA under an Emergency Use Authorization (EUA). This EUA will remain in effect (meaning this test can be used) for the duration of the COVID-19 declaration under Section 564(b)(1) of the Act, 21 U.S.C. section 360bbb-3(b)(1), unless the authorization is terminated or revoked.  Performed at [facility], [address], [place], [place] [address]          Radiology Studies: No results found.       Scheduled Meds:  aspirin  EC  81 mg Oral Daily   Chlorhexidine  Gluconate Cloth  6 each Topical Daily   clopidogrel   75 mg Oral Daily   enoxaparin  (LOVENOX ) injection  40 mg Subcutaneous Q24H   levETIRAcetam   1,000 mg Oral BID   levothyroxine   125 mcg Oral Q0600   rosuvastatin   40 mg Oral Daily   sodium chloride  flush  10-40 mL Intracatheter Q12H   Continuous Infusions:  ceFEPime  (MAXIPIME ) IV 2 g (12/21/23 0532)   vancomycin  1,000 mg (12/20/23 2340)     LOS: 4 days      Alphonsus Jeans, MD [facility] Pager [phone]  If 7PM-7AM, please contact night-coverage [email or web address] 12/21/2023, 8:36 AM

## 2023-12-21 NOTE — Care Management Important Message (Signed)
 Important Message  Patient Details  Name: Joshua [name] MRN: [id number] Date of Birth: Jun 28, 1956   Important Message Given:  Yes - Medicare IM     Mikayla Chiusano W, CMA 12/21/2023, 2:08 PM

## 2023-12-21 NOTE — Plan of Care (Signed)

## 2023-12-21 NOTE — TOC Transition Note (Signed)
 Transition of Care [facility]) - Discharge Note   Patient Details  Name: Joshua [name] MRN: [id number] Date of Birth: 03/03/1956  [facility]) CM/SW Contact:  Alexandra Ice, RN Phone Number: 12/21/2023, 3:44 PM   Clinical Narrative:     Patient remains on IV ABX for treatment of cellulitis. Patient to discharge home when medically ready. No TOC needs identified, will continue to follow.    Barriers to Discharge: Continued Medical Work up   Patient Goals and CMS Choice            Discharge Placement                       Discharge Plan and Services Additional resources added to the After Visit Summary for                                       Social Drivers of Health (SDOH) Interventions SDOH Screenings   Food Insecurity: No Food Insecurity (12/18/2023)  Housing: Low Risk  (12/18/2023)  Transportation Needs: No Transportation Needs (12/18/2023)  Utilities: Not At Risk (12/18/2023)  Alcohol Screen: Low Risk  (04/16/2020)  Depression (PHQ2-9): Low Risk  (06/02/2023)  Financial Resource Strain: Low Risk  (07/27/2023)   [facility]  Physical Activity: Insufficiently Active (05/29/2023)  Social Connections: Socially Isolated (12/18/2023)  Stress: No Stress Concern Present (05/29/2023)  Tobacco Use: Medium Risk (12/17/2023)     Readmission Risk Interventions    10/09/2021    3:42 PM  Readmission Risk Prevention Plan  Transportation Screening Complete  PCP or Specialist Appt within 5-7 Days Complete  Home Care Screening Complete  Medication Review (RN CM) Complete

## 2023-12-21 NOTE — Plan of Care (Signed)
 Pt's last BM on 5/29, offered PRN constipation medication but patient declined it and stated he is willing to take something in the AM.  Problem: Education: Goal: Knowledge of General Education information will improve Description: Including pain rating scale, medication(s)/side effects and non-pharmacologic comfort measures Outcome: Progressing   Problem: Health Behavior/Discharge Planning: Goal: Ability to manage health-related needs will improve Outcome: Progressing   Problem: Clinical Measurements: Goal: Will remain free from infection Outcome: Progressing   Problem: Activity: Goal: Risk for activity intolerance will decrease Outcome: Progressing   Problem: Coping: Goal: Level of anxiety will decrease Outcome: Progressing   Problem: Elimination: Goal: Will not experience complications related to urinary retention Outcome: Progressing   Problem: Pain Managment: Goal: General experience of comfort will improve and/or be controlled Outcome: Progressing   Problem: Skin Integrity: Goal: Risk for impaired skin integrity will decrease Outcome: Progressing

## 2023-12-22 ENCOUNTER — Other Ambulatory Visit: Payer: Self-pay

## 2023-12-22 DIAGNOSIS — L03115 Cellulitis of right lower limb: Secondary | ICD-10-CM | POA: Diagnosis not present

## 2023-12-22 LAB — CULTURE, BLOOD (ROUTINE X 2): Culture: NO GROWTH

## 2023-12-22 LAB — CBC
HCT: 34.8 % — ABNORMAL LOW (ref 39.0–52.0)
Hemoglobin: 11.1 g/dL — ABNORMAL LOW (ref 13.0–17.0)
MCH: 28.6 pg (ref 26.0–34.0)
MCHC: 31.9 g/dL (ref 30.0–36.0)
MCV: 89.7 fL (ref 80.0–100.0)
Platelets: 253 10*3/uL (ref 150–400)
RBC: 3.88 MIL/uL — ABNORMAL LOW (ref 4.22–5.81)
RDW: 16.3 % — ABNORMAL HIGH (ref 11.5–15.5)
WBC: 5.9 10*3/uL (ref 4.0–10.5)
nRBC: 0 % (ref 0.0–0.2)

## 2023-12-22 LAB — BASIC METABOLIC PANEL WITH GFR
Anion gap: 11 (ref 5–15)
BUN: 8 mg/dL (ref 8–23)
CO2: 25 mmol/L (ref 22–32)
Calcium: 8.4 mg/dL — ABNORMAL LOW (ref 8.9–10.3)
Chloride: 103 mmol/L (ref 98–111)
Creatinine, Ser: 1.17 mg/dL (ref 0.61–1.24)
GFR, Estimated: >60 mL/min (ref >60)
Glucose, Bld: 128 mg/dL — ABNORMAL HIGH (ref 70–99)
Potassium: 2.9 mmol/L — ABNORMAL LOW (ref 3.5–5.1)
Sodium: 139 mmol/L (ref 135–145)

## 2023-12-22 LAB — MAGNESIUM: Magnesium: 2.4 mg/dL (ref 1.7–2.4)

## 2023-12-22 MED ORDER — DOXYCYCLINE HYCLATE 100 MG PO TABS
100.0000 mg | ORAL_TABLET | Freq: Two times a day (BID) | ORAL | 0 refills | Status: DC
  Filled 2023-12-22: qty 6, 3d supply, fill #0

## 2023-12-22 MED ORDER — POTASSIUM CHLORIDE 20 MEQ PO PACK: 40.0000 meq | PACK | Freq: Once | ORAL | Status: DC

## 2023-12-22 MED ORDER — POTASSIUM CHLORIDE CRYS ER 20 MEQ PO TBCR
40.0000 meq | EXTENDED_RELEASE_TABLET | Freq: Once | ORAL | Status: AC
  Administered 2023-12-22: 40 meq via ORAL
  Filled 2023-12-22: qty 2

## 2023-12-22 MED ORDER — OXYCODONE-ACETAMINOPHEN 5-325 MG PO TABS
1.0000 | ORAL_TABLET | Freq: Four times a day (QID) | ORAL | 0 refills | Status: AC | PRN
  Filled 2023-12-22: qty 12, 3d supply, fill #0

## 2023-12-22 MED ORDER — AMOXICILLIN-POT CLAVULANATE 875-125 MG PO TABS
1.0000 | ORAL_TABLET | Freq: Two times a day (BID) | ORAL | 0 refills | Status: AC
  Filled 2023-12-22: qty 6, 3d supply, fill #0

## 2023-12-22 NOTE — Plan of Care (Signed)
 K 2.9 this morning, night covering provider notified. PO 40mEq X2 ordered.   Patient refused milk of mag for constipation but after education agreeable to colace but refusing to try miralax . Colace order received from Dr. Achilles Holes and given as ordered.    Problem: Education: Goal: Knowledge of General Education information will improve Description: Including pain rating scale, medication(s)/side effects and non-pharmacologic comfort measures Outcome: Progressing   Problem: Health Behavior/Discharge Planning: Goal: Ability to manage health-related needs will improve Outcome: Progressing   Problem: Clinical Measurements: Goal: Will remain free from infection Outcome: Progressing Goal: Diagnostic test results will improve Outcome: Progressing   Problem: Activity: Goal: Risk for activity intolerance will decrease Outcome: Progressing   Problem: Nutrition: Goal: Adequate nutrition will be maintained Outcome: Progressing   Problem: Coping: Goal: Level of anxiety will decrease Outcome: Progressing   Problem: Elimination: Goal: Will not experience complications related to urinary retention Outcome: Progressing   Problem: Pain Managment: Goal: General experience of comfort will improve and/or be controlled Outcome: Progressing   Problem: Skin Integrity: Goal: Risk for impaired skin integrity will decrease Outcome: Progressing   Problem: Clinical Measurements: Goal: Ability to avoid or minimize complications of infection will improve Outcome: Progressing

## 2023-12-22 NOTE — TOC Transition Note (Signed)
 Transition of Care [facility]) - Discharge Note   Patient Details  Name: Joshua [name] MRN: [id number] Date of Birth: [birth year]  [facility]) CM/SW Contact:  Alexandra Ice, RN Phone Number: 12/22/2023, 12:21 PM   Clinical Narrative:     Patient to discharge today, to home. No TOC needs identified.   Final next level of care: Home/Self Care Barriers to Discharge: Barriers Resolved   Patient Goals and CMS Choice            Discharge Placement                  Name of family member notified: Mindy Patient and family notified of of transfer: 12/22/23  Discharge Plan and Services Additional resources added to the After Visit Summary for                    DME Agency: NA       HH Arranged: NA          Social Drivers of Health (SDOH) Interventions SDOH Screenings   Food Insecurity: No Food Insecurity (12/18/2023)  Housing: Low Risk  (12/18/2023)  Transportation Needs: No Transportation Needs (12/18/2023)  Utilities: Not At Risk (12/18/2023)  Alcohol Screen: Low Risk  (04/16/2020)  Depression (PHQ2-9): Low Risk  (06/02/2023)  Financial Resource Strain: Low Risk  (07/27/2023)   Received from [facility]  Physical Activity: Insufficiently Active (05/29/2023)  Social Connections: Socially Isolated (12/18/2023)  Stress: No Stress Concern Present (05/29/2023)  Tobacco Use: Medium Risk (12/17/2023)     Readmission Risk Interventions    10/09/2021    3:42 PM  Readmission Risk Prevention Plan  Transportation Screening Complete  PCP or Specialist Appt within 5-7 Days Complete  Home Care Screening Complete  Medication Review (RN CM) Complete

## 2023-12-22 NOTE — Discharge Summary (Signed)
 Physician Discharge Summary  BERMAN GRAINGER ZOX:[id number] DOB: 10/12/1955 DOA: 12/17/2023  PCP: Delmus Ferri, PA  Admit date: 12/17/2023 Discharge date: 12/22/2023  Admitted From: home  Disposition:  home   Recommendations for Outpatient Follow-up:  Follow up with PCP in 1-2 weeks   Home Health: no  Equipment/Devices:  Discharge Condition: stable  CODE STATUS: full  Diet recommendation: Regular   Brief/Interim Summary: HPI was taken from Dr. Achilles Holes: Joshua [name] is a 68 y.o. Caucasian male with medical history significant for COPD, hypertension, hypothyroidism, peripheral vascular disease and OSA as well as cancer, presented to the emergency room with acute onset of worsening right foot swelling with associated erythema, tenderness and occasional pain.  No fever or chills.  No nausea or vomiting or abdominal pain.  Did not any chest pain palpitations.  No cough or wheezing or hemoptysis.  It started on 4018 when he was walking bare feet and stubbed his right big toe with subsequent minor bleeding.  It was later seen for about symptoms of infection was given about a week and a half course of antibiotics including recent p.o. Keflex  without significant improvement.  No dysuria, oliguria or hematuria or flank pain.  No other bleeding diathesis.   ED Course: When he came to the ER, vital signs were within normal except for heart rate of 124 later 107.  Labs revealed hypokalemia 3.1 with creatinine 1.26 and lactic acid 3.1 albumin  2.1.  Respiratory panel came back negative.  Blood cultures were drawn. EKG as reviewed by me : She showed sinus tachycardia with rate 121 with poor R wave progression.  EKG with right is enlargement and right axis deviation. Imaging: 2 view chest x-ray showed no acute cardiopulmonary disease 2 view right foot x-ray showed generalized soft tissue swelling with no radiographic evidence of osteomyelitis.  It showed mild degenerative changes of the first  metatarsophalangeal joint.   The patient was given IV Rocephin  and 2 L bolus of IV lactated Ringer  as well as 4 mg of IV Zofran  and 50 mcg of IV fentanyl .  He will be admitted to a medical bed for further evaluation and management.  Discharge Diagnoses:  Principal Problem:   Cellulitis of right foot Active Problems:   PVD (peripheral vascular disease) (HCC)   Hypothyroidism   Seizure disorder (HCC)   Dyslipidemia  Cellulitis of right foot: improving. Continue on IV vanco, cefepime  while inpatient and d/c home on po doxy & augmentin  to complete the course. Continue to monitor Cr while on vanco. No osteomyelitis noted on XR  PVD: continue on aspirin , plavix    Normocytic anemia: H&H are stable. No need for a transfusion currently    HLD:  continue on statin   Hypokalemia: potassium given    Seizure disorder:  continue on home dose of keppra     Hypothyroidism: continue on home dose of levothyroxine    PreDM: HbA1c 6.3 in 05/2023. Encourage a healthy diet   Discharge Instructions  Discharge Instructions     Diet general   Complete by: As directed    Discharge instructions   Complete by: As directed    F/u w/ PCP in 1 week.   Increase activity slowly   Complete by: As directed       Allergies as of 12/22/2023   No Active Allergies      Medication List     STOP taking these medications    cephALEXin  500 MG capsule Commonly known as: KEFLEX   TAKE these medications    amoxicillin -clavulanate 875-125 MG tablet Commonly known as: AUGMENTIN  Take 1 tablet by mouth 2 (two) times daily for 3 days.   aspirin  EC 81 MG tablet Take 81 mg by mouth daily. Swallow whole.   clopidogrel  75 MG tablet Commonly known as: PLAVIX  TAKE 1 TABLET BY MOUTH DAILY   doxycycline 100 MG tablet Commonly known as: VIBRA-TABS Take 1 tablet (100 mg total) by mouth 2 (two) times daily for 3 days.   levETIRAcetam  1000 MG tablet Commonly known as: KEPPRA  Take 1 tablet (1,000 mg  total) by mouth 2 (two) times daily.   levothyroxine  125 MCG tablet Commonly known as: SYNTHROID  Take 1 tablet (125 mcg total) by mouth daily before breakfast.   oxyCODONE -acetaminophen  5-325 MG tablet Commonly known as: PERCOCET/ROXICET Take 1 tablet by mouth every 6 (six) hours as needed for up to 3 days for moderate pain (pain score 4-6) or severe pain (pain score 7-10).   rosuvastatin  40 MG tablet Commonly known as: CRESTOR  Take 1 tablet (40 mg total) by mouth daily.        No Active Allergies  Consultations:    Procedures/Studies: DG Foot 2 Views Right Result Date: 12/17/2023 CLINICAL DATA:  Cellulitis. Right foot pain for 1 month. Wound to big toe. Redness and swelling. EXAM: RIGHT FOOT - 2 VIEW COMPARISON:  None Available. FINDINGS: Generalized soft tissue edema. Site of wound is not well delineated by radiograph. No erosive or bony destructive change. No fracture. Mild degenerative change of the first metatarsal phalangeal joint. No soft tissue gas or radiopaque foreign body. Small plantar calcaneal spur. IMPRESSION: 1. Generalized soft tissue edema. No radiographic evidence of osteomyelitis. 2. Mild degenerative change of the first metatarsophalangeal joint. Electronically Signed   By: Chadwick Colonel M.D.   On: 12/17/2023 23:29   DG Chest 2 View Result Date: 12/17/2023 CLINICAL DATA:  Suspected sepsis EXAM: CHEST - 2 VIEW COMPARISON:  02/10/2022 FINDINGS: Right Port-A-Cath remains in place, unchanged. Heart mediastinal contours within normal limits. No acute confluent airspace opacities or effusions. Post treatment scarring noted in the right suprahilar region, unchanged. No acute bony abnormality. IMPRESSION: No active cardiopulmonary disease. Electronically Signed   By: Janeece Mechanic M.D.   On: 12/17/2023 21:05   (Echo, Carotid, EGD, Colonoscopy, ERCP)    Subjective: Pt c/o fatigue    Discharge Exam: Vitals:   12/22/23 0418 12/22/23 0818  BP: (!) 100/59 (!) 105/56   Pulse: 73 87  Resp: 20 16  Temp: (!) 97.5 F (36.4 C) 97.8 F (36.6 C)  SpO2: 97% 99%   Vitals:   12/21/23 1520 12/21/23 2006 12/22/23 0418 12/22/23 0818  BP: 128/73 120/79 (!) 100/59 (!) 105/56  Pulse: 94 99 73 87  Resp: 16 20 20 16   Temp:  98.6 F (37 C) (!) 97.5 F (36.4 C) 97.8 F (36.6 C)  TempSrc:  Oral Oral Oral  SpO2: 97% 97% 97% 99%  Weight:        General: Pt is alert, awake, not in acute distress Cardiovascular: S1/S2 +, no rubs, no gallops Respiratory: CTA bilaterally, no wheezing, no rhonchi Abdominal: Soft, NT, ND, bowel sounds + Extremities: erythema & slight edema of right foot.     The results of significant diagnostics from this hospitalization (including imaging, microbiology, ancillary and laboratory) are listed below for reference.     Microbiology: Recent Results (from the past 240 hours)  Culture, blood (Routine x 2)     Status: None   Collection  Time: 12/17/23  8:00 PM   Specimen: BLOOD  Result Value Ref Range Status   Specimen Description BLOOD BLOOD LEFT ARM  Final   Special Requests   Final    BOTTLES DRAWN AEROBIC AND ANAEROBIC Blood Culture results may not be optimal due to an inadequate volume of blood received in culture bottles   Culture   Final    NO GROWTH 5 DAYS Performed at [facility], [address] [place], [place] [address]    Report Status 12/22/2023 FINAL  Final  Culture, blood (Routine x 2)     Status: None (Preliminary result)   Collection Time: 12/17/23 11:28 PM   Specimen: BLOOD  Result Value Ref Range Status   Specimen Description BLOOD LEFT ARM  Final   Special Requests   Final    BOTTLES DRAWN AEROBIC AND ANAEROBIC Blood Culture adequate volume   Culture   Final    NO GROWTH 4 DAYS Performed at [facility], [address], [place], [place] [address]    Report Status PENDING  Incomplete  Resp panel by RT-PCR (RSV, Flu A&B, Covid) Anterior Nasal Swab     Status: None   Collection Time:  12/17/23 11:28 PM   Specimen: Anterior Nasal Swab  Result Value Ref Range Status   SARS Coronavirus 2 by RT PCR NEGATIVE NEGATIVE Final    Comment: (NOTE) SARS-CoV-2 target nucleic acids are NOT DETECTED.  The SARS-CoV-2 RNA is generally detectable in upper respiratory specimens during the acute phase of infection. The lowest concentration of SARS-CoV-2 viral copies this assay can detect is 138 copies/mL. A negative result does not preclude SARS-Cov-2 infection and should not be used as the sole basis for treatment or other patient management decisions. A negative result may occur with  improper specimen collection/handling, submission of specimen other than nasopharyngeal swab, presence of viral mutation(s) within the areas targeted by this assay, and inadequate number of viral copies(<138 copies/mL). A negative result must be combined with clinical observations, patient history, and epidemiological information. The expected result is Negative.  Fact Sheet for Patients:  BloggerCourse.com  Fact Sheet for Healthcare Providers:  SeriousBroker.it  This test is no t yet approved or cleared by the [place]  FDA and  has been authorized for detection and/or diagnosis of SARS-CoV-2 by FDA under an Emergency Use Authorization (EUA). This EUA will remain  in effect (meaning this test can be used) for the duration of the COVID-19 declaration under Section 564(b)(1) of the Act, 21 U.S.C.section 360bbb-3(b)(1), unless the authorization is terminated  or revoked sooner.       Influenza A by PCR NEGATIVE NEGATIVE Final   Influenza B by PCR NEGATIVE NEGATIVE Final    Comment: (NOTE) The Xpert Xpress SARS-CoV-2/FLU/RSV plus assay is intended as an aid in the diagnosis of influenza from Nasopharyngeal swab specimens and should not be used as a sole basis for treatment. Nasal washings and aspirates are unacceptable for Xpert Xpress  SARS-CoV-2/FLU/RSV testing.  Fact Sheet for Patients: BloggerCourse.com  Fact Sheet for Healthcare Providers: SeriousBroker.it  This test is not yet approved or cleared by the [place]  FDA and has been authorized for detection and/or diagnosis of SARS-CoV-2 by FDA under an Emergency Use Authorization (EUA). This EUA will remain in effect (meaning this test can be used) for the duration of the COVID-19 declaration under Section 564(b)(1) of the Act, 21 U.S.C. section 360bbb-3(b)(1), unless the authorization is terminated or revoked.     Resp Syncytial Virus by  PCR NEGATIVE NEGATIVE Final    Comment: (NOTE) Fact Sheet for Patients: BloggerCourse.com  Fact Sheet for Healthcare Providers: SeriousBroker.it  This test is not yet approved or cleared by the [place]  FDA and has been authorized for detection and/or diagnosis of SARS-CoV-2 by FDA under an Emergency Use Authorization (EUA). This EUA will remain in effect (meaning this test can be used) for the duration of the COVID-19 declaration under Section 564(b)(1) of the Act, 21 U.S.C. section 360bbb-3(b)(1), unless the authorization is terminated or revoked.  Performed at [facility], [address] [place], [place] [address]      Labs: BNP (last 3 results) No results for input(s): "BNP" in the last 8760 hours. Basic Metabolic Panel: Recent Labs  Lab 12/18/23 0235 12/19/23 0438 12/20/23 0421 12/21/23 0214 12/22/23 0218  NA 137 139 142 140 139  K 3.1* 2.9* 3.4* 3.3* 2.9*  CL 102 106 109 108 103  CO2 27 26 25 27 25   GLUCOSE 98 155* 102* 117* 128*  BUN 10 8 <5* 6* 8  CREATININE 1.11 1.24 0.99 1.06 1.17  CALCIUM  8.3* 8.1* 8.4* 8.5* 8.4*  MG  --   --   --   --  2.4   Liver Function Tests: Recent Labs  Lab 12/17/23 2000  AST 33  ALT 23  ALKPHOS 72  BILITOT 1.4*  PROT 7.5  ALBUMIN  3.6   No  results for input(s): "LIPASE", "AMYLASE" in the last 168 hours. No results for input(s): "AMMONIA" in the last 168 hours. CBC: Recent Labs  Lab 12/17/23 2000 12/18/23 0235 12/19/23 0438 12/20/23 0421 12/21/23 0214 12/22/23 0218  WBC 6.2 5.3 3.8* 4.4 4.1 5.9  NEUTROABS 4.7  --   --   --   --   --   HGB 12.9* 10.2* 9.7* 10.4* 10.4* 11.1*  HCT 40.0 30.1* 29.2* 32.3* 32.0* 34.8*  MCV 88.9 85.3 88.2 89.2 89.4 89.7  PLT 282 199 193 218 215 253   Cardiac Enzymes: No results for input(s): "CKTOTAL", "CKMB", "CKMBINDEX", "TROPONINI" in the last 168 hours. BNP: Invalid input(s): "POCBNP" CBG: No results for input(s): "GLUCAP" in the last 168 hours. D-Dimer No results for input(s): "DDIMER" in the last 72 hours. Hgb A1c No results for input(s): "HGBA1C" in the last 72 hours. Lipid Profile No results for input(s): "CHOL", "HDL", "LDLCALC", "TRIG", "CHOLHDL", "LDLDIRECT" in the last 72 hours. Thyroid  function studies No results for input(s): "TSH", "T4TOTAL", "T3FREE", "THYROIDAB" in the last 72 hours.  Invalid input(s): "FREET3" Anemia work up No results for input(s): "VITAMINB12", "FOLATE", "FERRITIN", "TIBC", "IRON", "RETICCTPCT" in the last 72 hours. Urinalysis    Component Value Date/Time   COLORURINE YELLOW (A) 12/19/2023 0615   APPEARANCEUR CLOUDY (A) 12/19/2023 0615   LABSPEC 1.004 (L) 12/19/2023 0615   PHURINE 6.0 12/19/2023 0615   GLUCOSEU NEGATIVE 12/19/2023 0615   HGBUR SMALL (A) 12/19/2023 0615   BILIRUBINUR NEGATIVE 12/19/2023 0615   KETONESUR NEGATIVE 12/19/2023 0615   PROTEINUR NEGATIVE 12/19/2023 0615   UROBILINOGEN 0.2 08/02/2009 1129   NITRITE NEGATIVE 12/19/2023 0615   LEUKOCYTESUR NEGATIVE 12/19/2023 0615   Sepsis Labs Recent Labs  Lab 12/19/23 0438 12/20/23 0421 12/21/23 0214 12/22/23 0218  WBC 3.8* 4.4 4.1 5.9   Microbiology Recent Results (from the past 240 hours)  Culture, blood (Routine x 2)     Status: None   Collection Time: 12/17/23   8:00 PM   Specimen: BLOOD  Result Value Ref Range Status   Specimen Description BLOOD BLOOD  LEFT ARM  Final   Special Requests   Final    BOTTLES DRAWN AEROBIC AND ANAEROBIC Blood Culture results may not be optimal due to an inadequate volume of blood received in culture bottles   Culture   Final    NO GROWTH 5 DAYS Performed at [facility], [address] [place], [place] [address]    Report Status 12/22/2023 FINAL  Final  Culture, blood (Routine x 2)     Status: None (Preliminary result)   Collection Time: 12/17/23 11:28 PM   Specimen: BLOOD  Result Value Ref Range Status   Specimen Description BLOOD LEFT ARM  Final   Special Requests   Final    BOTTLES DRAWN AEROBIC AND ANAEROBIC Blood Culture adequate volume   Culture   Final    NO GROWTH 4 DAYS Performed at [facility], [address] [place], [place] [address]    Report Status PENDING  Incomplete  Resp panel by RT-PCR (RSV, Flu A&B, Covid) Anterior Nasal Swab     Status: None   Collection Time: 12/17/23 11:28 PM   Specimen: Anterior Nasal Swab  Result Value Ref Range Status   SARS Coronavirus 2 by RT PCR NEGATIVE NEGATIVE Final    Comment: (NOTE) SARS-CoV-2 target nucleic acids are NOT DETECTED.  The SARS-CoV-2 RNA is generally detectable in upper respiratory specimens during the acute phase of infection. The lowest concentration of SARS-CoV-2 viral copies this assay can detect is 138 copies/mL. A negative result does not preclude SARS-Cov-2 infection and should not be used as the sole basis for treatment or other patient management decisions. A negative result may occur with  improper specimen collection/handling, submission of specimen other than nasopharyngeal swab, presence of viral mutation(s) within the areas targeted by this assay, and inadequate number of viral copies(<138 copies/mL). A negative result must be combined with clinical observations, patient history, and  epidemiological information. The expected result is Negative.  Fact Sheet for Patients:  BloggerCourse.com  Fact Sheet for Healthcare Providers:  SeriousBroker.it  This test is no t yet approved or cleared by the [place]  FDA and  has been authorized for detection and/or diagnosis of SARS-CoV-2 by FDA under an Emergency Use Authorization (EUA). This EUA will remain  in effect (meaning this test can be used) for the duration of the COVID-19 declaration under Section 564(b)(1) of the Act, 21 U.S.C.section 360bbb-3(b)(1), unless the authorization is terminated  or revoked sooner.       Influenza A by PCR NEGATIVE NEGATIVE Final   Influenza B by PCR NEGATIVE NEGATIVE Final    Comment: (NOTE) The Xpert Xpress SARS-CoV-2/FLU/RSV plus assay is intended as an aid in the diagnosis of influenza from Nasopharyngeal swab specimens and should not be used as a sole basis for treatment. Nasal washings and aspirates are unacceptable for Xpert Xpress SARS-CoV-2/FLU/RSV testing.  Fact Sheet for Patients: BloggerCourse.com  Fact Sheet for Healthcare Providers: SeriousBroker.it  This test is not yet approved or cleared by the [place]  FDA and has been authorized for detection and/or diagnosis of SARS-CoV-2 by FDA under an Emergency Use Authorization (EUA). This EUA will remain in effect (meaning this test can be used) for the duration of the COVID-19 declaration under Section 564(b)(1) of the Act, 21 U.S.C. section 360bbb-3(b)(1), unless the authorization is terminated or revoked.     Resp Syncytial Virus by PCR NEGATIVE NEGATIVE Final    Comment: (NOTE) Fact Sheet for Patients: BloggerCourse.com  Fact Sheet for Healthcare Providers: SeriousBroker.it  This test is not yet approved or cleared by the [place]  FDA and has been  authorized for detection and/or diagnosis of SARS-CoV-2 by FDA under an Emergency Use Authorization (EUA). This EUA will remain in effect (meaning this test can be used) for the duration of the COVID-19 declaration under Section 564(b)(1) of the Act, 21 U.S.C. section 360bbb-3(b)(1), unless the authorization is terminated or revoked.  Performed at [facility], [address] [place], [place] [address]      Time coordinating discharge: Over 30 minutes  SIGNED:   Alphonsus Jeans, MD  [facility] 12/22/2023, 12:13 PM Pager   If 7PM-7AM, please contact night-coverage [email or web address]

## 2023-12-23 ENCOUNTER — Telehealth

## 2023-12-23 LAB — CULTURE, BLOOD (ROUTINE X 2)
Culture: NO GROWTH
Special Requests: ADEQUATE

## 2023-12-23 NOTE — Progress Notes (Signed)
  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  The patient did not note fatigue during radiation. The patient did  note hair loss or skin changes in the field of radiation during therapy. The patient is not taking dexamethasone . The patient does not have symptoms of  weakness or loss of control of the extremities. The patient does not have symptoms of headache. The patient does not have symptoms of seizure or uncontrolled movement. The patient does not have symptoms of changes in vision. The patient does not have changes in speech. The patient does not have confusion.   The patient was counseled that he  will be contacted by our brain and spine navigator to schedule surveillance imaging. The patient was encouraged to call if he  have not received a call to schedule imaging, or if he develops concerns or questions regarding radiation. The patient will also continue to follow up with Dr. Wilhelmenia Harada and Dr. Mark Sil.  This concludes the interaction.  Avery Bodo, LPN

## 2023-12-25 ENCOUNTER — Inpatient Hospital Stay: Attending: Oncology | Admitting: Internal Medicine

## 2023-12-25 ENCOUNTER — Inpatient Hospital Stay

## 2023-12-25 ENCOUNTER — Inpatient Hospital Stay (HOSPITAL_BASED_OUTPATIENT_CLINIC_OR_DEPARTMENT_OTHER): Admitting: Oncology

## 2023-12-25 ENCOUNTER — Other Ambulatory Visit: Payer: Self-pay

## 2023-12-25 ENCOUNTER — Emergency Department

## 2023-12-25 ENCOUNTER — Inpatient Hospital Stay: Attending: Oncology

## 2023-12-25 ENCOUNTER — Emergency Department
Admission: EM | Admit: 2023-12-25 | Discharge: 2023-12-25 | Disposition: A | Discharge status: Home / Self Care | Attending: Emergency Medicine | Admitting: Emergency Medicine

## 2023-12-25 ENCOUNTER — Encounter: Payer: Self-pay | Admitting: Oncology

## 2023-12-25 VITALS — BP 122/82 | HR 99 | Temp 97.9°F | Resp 20 | Wt 210.2 lb

## 2023-12-25 DIAGNOSIS — C3411 Malignant neoplasm of upper lobe, right bronchus or lung: Secondary | ICD-10-CM | POA: Insufficient documentation

## 2023-12-25 DIAGNOSIS — L03115 Cellulitis of right lower limb: Secondary | ICD-10-CM | POA: Insufficient documentation

## 2023-12-25 DIAGNOSIS — C3491 Malignant neoplasm of unspecified part of right bronchus or lung: Secondary | ICD-10-CM

## 2023-12-25 DIAGNOSIS — Z4800 Encounter for change or removal of nonsurgical wound dressing: Secondary | ICD-10-CM | POA: Insufficient documentation

## 2023-12-25 DIAGNOSIS — E032 Hypothyroidism due to medicaments and other exogenous substances: Secondary | ICD-10-CM | POA: Diagnosis not present

## 2023-12-25 DIAGNOSIS — R7989 Other specified abnormal findings of blood chemistry: Secondary | ICD-10-CM | POA: Insufficient documentation

## 2023-12-25 DIAGNOSIS — Z5189 Encounter for other specified aftercare: Secondary | ICD-10-CM

## 2023-12-25 DIAGNOSIS — E876 Hypokalemia: Secondary | ICD-10-CM | POA: Diagnosis not present

## 2023-12-25 DIAGNOSIS — C7931 Secondary malignant neoplasm of brain: Secondary | ICD-10-CM | POA: Diagnosis not present

## 2023-12-25 DIAGNOSIS — S91301A Unspecified open wound, right foot, initial encounter: Secondary | ICD-10-CM | POA: Diagnosis not present

## 2023-12-25 DIAGNOSIS — M7989 Other specified soft tissue disorders: Secondary | ICD-10-CM | POA: Diagnosis not present

## 2023-12-25 LAB — COMPREHENSIVE METABOLIC PANEL WITH GFR
ALT: 17 U/L (ref 0–44)
ALT: 17 U/L (ref 0–44)
AST: 32 U/L (ref 15–41)
AST: 32 U/L (ref 15–41)
Albumin: 3 g/dL — ABNORMAL LOW (ref 3.5–5.0)
Albumin: 3.2 g/dL — ABNORMAL LOW (ref 3.5–5.0)
Alkaline Phosphatase: 86 U/L (ref 38–126)
Alkaline Phosphatase: 78 U/L (ref 38–126)
Anion gap: 9 (ref 5–15)
Anion gap: 8 (ref 5–15)
BUN: 9 mg/dL (ref 8–23)
BUN: 9 mg/dL (ref 8–23)
CO2: 23 mmol/L (ref 22–32)
CO2: 26 mmol/L (ref 22–32)
Calcium: 8.1 mg/dL — ABNORMAL LOW (ref 8.9–10.3)
Calcium: 8.8 mg/dL — ABNORMAL LOW (ref 8.9–10.3)
Chloride: 104 mmol/L (ref 98–111)
Chloride: 105 mmol/L (ref 98–111)
Creatinine, Ser: 1.25 mg/dL — ABNORMAL HIGH (ref 0.61–1.24)
Creatinine, Ser: 1.19 mg/dL (ref 0.61–1.24)
GFR, Estimated: >60 mL/min (ref >60)
GFR, Estimated: >60 mL/min (ref >60)
Glucose, Bld: 155 mg/dL — ABNORMAL HIGH (ref 70–99)
Glucose, Bld: 125 mg/dL — ABNORMAL HIGH (ref 70–99)
Potassium: 2.9 mmol/L — ABNORMAL LOW (ref 3.5–5.1)
Potassium: 3.4 mmol/L — ABNORMAL LOW (ref 3.5–5.1)
Sodium: 136 mmol/L (ref 135–145)
Sodium: 139 mmol/L (ref 135–145)
Total Bilirubin: 0.7 mg/dL (ref 0.0–1.2)
Total Bilirubin: 0.9 mg/dL (ref 0.0–1.2)
Total Protein: 6.6 g/dL (ref 6.5–8.1)
Total Protein: 6.9 g/dL (ref 6.5–8.1)

## 2023-12-25 LAB — CBC WITH DIFFERENTIAL/PLATELET
Abs Immature Granulocytes: 0.04 10*3/uL (ref 0.00–0.07)
Abs Immature Granulocytes: 0.04 10*3/uL (ref 0.00–0.07)
Basophils Absolute: 0.1 10*3/uL (ref 0.0–0.1)
Basophils Absolute: 0.1 10*3/uL (ref 0.0–0.1)
Basophils Relative: 1 %
Basophils Relative: 1 %
Eosinophils Absolute: 0.1 10*3/uL (ref 0.0–0.5)
Eosinophils Absolute: 0.1 10*3/uL (ref 0.0–0.5)
Eosinophils Relative: 2 %
Eosinophils Relative: 1 %
HCT: 38.4 % — ABNORMAL LOW (ref 39.0–52.0)
HCT: 40.7 % (ref 39.0–52.0)
Hemoglobin: 12.2 g/dL — ABNORMAL LOW (ref 13.0–17.0)
Hemoglobin: 13.1 g/dL (ref 13.0–17.0)
Immature Granulocytes: 1 %
Immature Granulocytes: 1 %
Lymphocytes Relative: 17 %
Lymphocytes Relative: 17 %
Lymphs Abs: 1.2 10*3/uL (ref 0.7–4.0)
Lymphs Abs: 1.3 10*3/uL (ref 0.7–4.0)
MCH: 28.3 pg (ref 26.0–34.0)
MCH: 28.4 pg (ref 26.0–34.0)
MCHC: 31.8 g/dL (ref 30.0–36.0)
MCHC: 32.2 g/dL (ref 30.0–36.0)
MCV: 89.1 fL (ref 80.0–100.0)
MCV: 88.3 fL (ref 80.0–100.0)
Monocytes Absolute: 0.8 10*3/uL (ref 0.1–1.0)
Monocytes Absolute: 0.9 10*3/uL (ref 0.1–1.0)
Monocytes Relative: 12 %
Monocytes Relative: 11 %
Neutro Abs: 4.8 10*3/uL (ref 1.7–7.7)
Neutro Abs: 5.4 10*3/uL (ref 1.7–7.7)
Neutrophils Relative %: 67 %
Neutrophils Relative %: 69 %
Platelets: 316 10*3/uL (ref 150–400)
Platelets: 336 10*3/uL (ref 150–400)
RBC: 4.31 MIL/uL (ref 4.22–5.81)
RBC: 4.61 MIL/uL (ref 4.22–5.81)
RDW: 16.4 % — ABNORMAL HIGH (ref 11.5–15.5)
RDW: 16.5 % — ABNORMAL HIGH (ref 11.5–15.5)
WBC: 7 10*3/uL (ref 4.0–10.5)
WBC: 7.8 10*3/uL (ref 4.0–10.5)
nRBC: 0 % (ref 0.0–0.2)
nRBC: 0 % (ref 0.0–0.2)

## 2023-12-25 LAB — T4, FREE: Free T4: 1.89 ng/dL — ABNORMAL HIGH (ref 0.61–1.12)

## 2023-12-25 LAB — TSH: TSH: 4.642 u[IU]/mL — ABNORMAL HIGH (ref 0.350–4.500)

## 2023-12-25 MED ORDER — CEFUROXIME AXETIL 250 MG PO TABS
250.0000 mg | ORAL_TABLET | Freq: Two times a day (BID) | ORAL | 0 refills | Status: DC
  Filled 2023-12-25: qty 14, 7d supply, fill #0

## 2023-12-25 MED ORDER — HEPARIN SOD (PORK) LOCK FLUSH 100 UNIT/ML IV SOLN
500.0000 [IU] | Freq: Once | INTRAVENOUS | Status: AC
  Administered 2023-12-25: 500 [IU] via INTRAVENOUS
  Filled 2023-12-25: qty 5

## 2023-12-25 MED ORDER — DOXYCYCLINE HYCLATE 100 MG PO TABS
100.0000 mg | ORAL_TABLET | Freq: Two times a day (BID) | ORAL | 0 refills | Status: AC
  Filled 2023-12-25: qty 14, 7d supply, fill #0

## 2023-12-25 NOTE — ED Notes (Signed)
 Sent down lactic, order not placed at this time

## 2023-12-25 NOTE — ED Notes (Signed)
 Port de-accessed after heparin  adminstration.

## 2023-12-25 NOTE — Assessment & Plan Note (Signed)
 Possibly due to immunotherapy/medication side effects.  Resolved.

## 2023-12-25 NOTE — Assessment & Plan Note (Signed)
 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  PET brain finding suggests recurrence -NeuroOnc recommend Radiation and Bevacizumab   Follow up with Dr. Mark Sil.   Proceed with Bevacizumab  10mg /kg Q2 weeks

## 2023-12-25 NOTE — Assessment & Plan Note (Addendum)
 Recent admission due to right lower extremity cellulitis GI Patient was on IV antibiotics during hospitalization and cellulitis improved.  He was discharged on oral antibiotics noticed worsening of cellulitis since yesterday. Recommend patient to go to emergency room.  Concerned that his cellulitis may need additional IV antibiotics for treatments.

## 2023-12-25 NOTE — Assessment & Plan Note (Signed)
 Patient receives IV potassium in emergency room

## 2023-12-25 NOTE — Assessment & Plan Note (Signed)
 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  Labs are reviewed and discussed with patient. Hold off Durvalumab  due to acute cellulitis

## 2023-12-25 NOTE — Progress Notes (Signed)
 Hematology/Oncology Progress note [phone] [phone]    CHIEF COMPLAINTS/REASON FOR VISIT:  Follow up for lung cancer  ASSESSMENT & PLAN:   Cancer Staging  Squamous cell carcinoma of right lung (HCC) Staging form: Lung, AJCC 8th Edition - Clinical stage from 09/20/2020: Stage IV (cT1b, cN2, cM1) - Signed by [name], MD on 12/13/2021   Squamous cell carcinoma of right lung (HCC) Durvalumab  was previously held due to pneumonitis.  Labs are reviewed and discussed with patient. Hold off Durvalumab  due to acute cellulitis  Elevated LFTs Possibly due to immunotherapy/medication side effects.  Resolved.  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  PET brain finding suggests recurrence -NeuroOnc recommend Radiation and Bevacizumab   Follow up with Dr. Mark Sil.   Proceed with Bevacizumab  10mg /kg Q2 weeks  Cellulitis of right lower extremity Recent admission due to right lower extremity cellulitis GI Patient was on IV antibiotics during hospitalization and cellulitis improved.  He was discharged on oral antibiotics noticed worsening of cellulitis since yesterday. Recommend patient to go to emergency room.  Concerned that his cellulitis may need additional IV antibiotics for treatments.  Hypokalemia Patient receives IV potassium in emergency room     Follow up 2 weeks lab MD Durvalumab + Bevacizumab  All questions were answered. The patient knows to call the clinic with any problems, questions or concerns.  [name], MD, PhD [facility] Hematology Oncology 12/25/2023       HISTORY OF PRESENTING ILLNESS:  Joshua [name] is a 68 y.o. male who has above history reviewed by me today presents for follow up visit for Stage IV lung squamous cell carcinoma. Oncology History  Squamous cell carcinoma of right lung (HCC)  09/18/2020 Imaging   PET scan  showed right upper lobe nodule maximal SUV 11.5.  Hypermetabolic cluster right paratracheal adenopathy with maximum SUV up to 12.  No findings of metastatic disease to the neck/abdomen/pelvis or skeleton.   09/20/2020 Initial Diagnosis   Squamous cell carcinoma of right lung (HCC) NGS showed PD-L1 TPS 95%, LRP1B S997fs, PIK3CA E545K, RB1 c1422-2A>T, RIT1 T83_ A84del, STK11 P281fs, TPS53 R248W, TMB 19.67mut/mb, MS stable.    09/20/2020 Cancer Staging   Staging form: Lung, AJCC 8th Edition - Clinical stage from 09/20/2020: Stage IV (cT1b, cN2, cM1) - Signed by [name], MD on 12/13/2021   10/03/2020 Imaging   MRI brain with and without contrast showed no evidence of intracranial metastatic disease. Well-defined round lesion within the left parotid tail measuring approximately 2.3 x 1.8 x 1.8 cm   10/09/2020 - 11/27/2020 Radiation Therapy   concurrent chemoradiation   10/10/2020 - 10/31/2020 Chemotherapy   10/10/2020 concurrent chemoradiation.  carboplatin  AUC of 2 and Taxol  45 mg/m2 x 4.  Additional chemotherapy was held due to thrombocytopenia, neutropenia       12/31/2020 - 03/21/2022 Chemotherapy   Maintenance  Durvalumab  q14d      12/31/2020 -  Chemotherapy   Patient is on Treatment Plan : LUNG Durvalumab  (10) q14d     03/28/2021 Imaging   CT without contrast showed new linear right perihilar consolidation with associated traction bronchiectasis.  Likely postradiation changes.  Additional new peribronchial vascular/nodular groundglass opacities with areas of consolidation or seen primarily in the right upper and lower lobes, more distant from expected radiation fields. 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  Per my discussion with pulmonology Dr. 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     05/09/2021 Imaging   Repeat CT chest without contrast showed evolving  radiation changes in the right perihilar region likely accounting for interval increase to thickening of the posterior wall of the right upper lobe bronchus.  The more peripheral right lung groundglass opacities seen on most recent study have generally improved with exception of one peripherally right upper lobe which appears new.  These are likely inflammatory.  No findings highly suspicious for local recurrence.   05/30/2021 - 01/10/2022 Chemotherapy   Durvalumab  q14d     10/07/2021 Imaging   MRI brain with and without contrast showed 2 contrast-enhancing lesions, most consistent with metastatic disease.  Largest lesion is in the left frontal lobe and measures up to 1.3 cm with a large amount of surrounding edema and 9 mm rightward midline shift.  The other enhancing lesion was 4 mm in the right occipital lobe with a small amount of surrounding edema.  No acute or chronic hemorrhage.   10/07/2021 Progression   Headache after colonoscopy, progressive difficulty using her right arm.  Progressively worsening of speech. Stage IV lung cancer with brain metastasis.  started on Dexamethasone .  He was seen by neurosurgery and radiation oncology.  Recommendation is to proceed with SRS.  Patient was discharged with dexamethasone    10/16/2021 Imaging   PET scan showed postradiation changes in the right supra hilar lung without evidence of local recurrence.  No evidence of lung cancer recurrence or metastatic disease.  Single focus of intense metabolic activity associated with posterior left 11th rib likely secondary to recent fall/posttraumatic metabolic changes.  Frontal  uptake in the left frontal lobe corresponding to the known metastatic lesion in the brain   10/29/2021 -  Radiation Therapy   Brain Radiation [facility]   12/10/2021 Imaging   Brain MRI: Continued interval decrease in size of a left frontal lobe metastasis, now measuring 10 mm. Moderate surrounding edema has also decreased.  3-4 mm metastasis within  the right occipital lobe, not significantly changed in size. There is now a small focus of central non enhancement within this lesion. Increased edema. 3 mm enhancing metastasis within the high left parietal lobe. In retrospect, this was likely present as a subtle punctate focus of enhancement on the prior brain MRI of 10/22/2021.   01/18/2022 Imaging   CT chest abdomen pelvis w contrast 1. Stable post treatment changes in the right upper lobe medially with dense radiation fibrosis but no findings to suggest local recurrent tumor. 2. Significant progression of ill-defined ground-glass opacity, interstitial thickening and airspace nodularity in the right upper lobe and upper aspect of the right lower lobe. Findings could reflect an inflammatory or atypical infectious process, drug induced pneumonitis or interstitial spread of tumor.3. No mediastinal or hilar mass or adenopathy. 4. No findings for abdominal/pelvic metastatic disease or osseous metastatic disease.   02/19/2022 Imaging   MRI brain w wo contrast showed Three metastatic deposits in the brain appear smaller. Decreased edema in the left parietal lobe. No new lesions    04/04/2022 Imaging   CT chest w contrast 1. Stable radiation changes in the right perihilar region without evidence of local recurrence. 2. Interval improvement in the patchy ground-glass opacities previously demonstrated in both lungs, most consistent with a resolving infectious or inflammatory process (including immunotherapy-related pneumonitis). 3. No evidence of metastatic disease.   05/14/2022 Miscellaneous   He had a mechanical fall  and fractured his left elbow. xrays showed a left elbow fracture  05/26/22 seen by orthopedic surgeon, cast applied.    05/27/2022 Imaging   MRI brain w wo contrast 1. All three known brain metastases have enlarged since August, with increasing regional edema. And there is a small new left anterior cerebellar 4-5 mm metastasis (was  questionably punctate on the May MRI). This constellation could reflect a combination of True Progression (Left Cerebellar lesion) and pseudoprogression/radionecrosis (3 existing mets).    2. No significant intracranial mass effect. 3. Underlying moderate cerebral white matter signal changes.   05/30/2022 Imaging   CT chest abdomen pelvis w contrast  1. 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 There is a stable small right pleural effusion, but no definitive findings to suggest residual or recurrent disease on today's examination. 2. No signs of metastatic disease in the abdomen or pelvis.  3. Aortic atherosclerosis, in addition to two-vessel coronary artery disease. Please note that although the presence of coronary artery calcium  documents the presence of coronary artery disease, the severity of this disease and any potential stenosis cannot be assessed on this non-gated CT examination. Assessment for potential risk factor modification, dietary therapy or pharmacologic therapy may be warranted, if clinically indicated. 4. Hepatic steatosis. 5. Colonic diverticulosis without evidence of acute diverticulitis at this time. 6. Incompletely imaged but apparently occluded vascular graft in the superior aspect of the right anterior thigh. Referral to vascular specialist for further clinical evaluation should be considered if clinically appropriate. 7. Additional incidental findings, as above.   07/22/2022 Imaging   MRI brain  1. No new or enlarging lesions. No intracranial disease progression. 2. Decreased size of treated lesions of the left frontoparietal junction and left postcentral gyrus. 3. Unchanged lesions in the left cerebellum and right occipital lobe.   10/15/2022 Imaging   MRI brain w wo contrast showed Interval increase in size of multiple intraparenchymal lesions  with worsened surrounding vasogenic edema. 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 No new lesions   11/05/2022 Imaging   MRI brain w wo contrast  1. Likely slight increase and right occipital and left cerebellar lesions. Otherwise, unchanged metastases, as above. No new lesions identified. 2. Partially imaged large probable lymph node in the left upper neck, apparent on prior PET CT.   11/13/2022 -  Radiation Therapy   Each of the sites were treated with SRS/SBRT/SRT-IMRT technique to 20 Gy in 1 fraction to each of the following sites: PTV_2_LCerebel_40mm PTV_3_RCerebel_48mm PTV_4_ROccip_58mm PTV_5_LParietal_42mm   11/27/2022 Imaging   CT chest abdomen pelvis w contrast showed  Improving bilateral scattered interstitial and ground-glass opacities. Small amount of residual areas in the extreme left lower lobe inferiorly. This specific area is slightly increased from previous but overall the amount of opacities is significantly improved. Recommend continued follow-up. No developing new mass lesion, fluid collection or lymph node enlargement. Stable posttreatment changes right perihilar in the lung. Fatty liver infiltration. Colonic diverticulosis. Improved tiny right pleural effusion   02/05/2023 Imaging   MRI brain with and without contrast Interval decrease in all of the metastatic lesions, detailed above.No new lesions identified.    Imaging   CT chest abdomen pelvis w contrast showed  1. 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 2. Trace right pleural fluid or thickening, new or minimally increased. 3. Incidental findings,  including: Aortic atherosclerosis (ICD10-I70.0), coronary artery atherosclerosis and emphysema (ICD10-J43.9). Hepatic steatosis.   06/25/2023 Imaging   CT chest abdomen pelvis w contrast showed 1. Unchanged post treatment/post  radiation appearance of the right lung, with perihilar and suprahilar paramedian fibrosis and consolidation. 2. No evidence of lymphadenopathy or metastatic disease in the chest, abdomen, or pelvis. 3. Hepatic steatosis. 4. Coronary artery disease.   Aortic Atherosclerosis (ICD10-I70.0) and Emphysema (ICD10-J43.9).   08/26/2023 [facility] Admission   hospitalization due to was observed speech and jerking movements on right side despite being on dexamethasone . Continuous EEG suggest cortical dysfunction arising from left temporal region.  No seizure or elective form discharges were seen. Patient was discharged on dexamethasone  4 mg, Keppra , lamotrigine .    11/04/2023 -  Chemotherapy   Add Bevacizumab  5mg /kg to Durvalumab .     11/17/2023 - 11/27/2023 Radiation Therapy   Patient received radiation to brain for treatment of recurrence.     chronic SOB,not worse.  S/p Brain radiation.  12/17/2023 - 12/22/2023 patient was recently hospitalized due to right lower extremity cellulitis.  Patient was treated with vancomycin , cefepime .Noted some improvement.  Patient reports that he was discharged on oral doxycycline and Augmentin  to complete course.  Patient noticed a worsening of symptoms soon after discharge.  No fever or chills.    Review of Systems  Constitutional:  Positive for fatigue. Negative for appetite change, chills, fever and unexpected weight change.  HENT:   Negative for hearing loss and voice change.   Eyes:  Negative for eye problems and icterus.  Respiratory:  Negative for chest tightness, cough and shortness of breath.   Cardiovascular:  Positive for leg swelling. Negative for chest pain.  Gastrointestinal:  Negative for abdominal distention and abdominal pain.  Endocrine: Negative for hot flashes.  Genitourinary:  Negative for difficulty urinating, dysuria and frequency.   Musculoskeletal:  Positive for gait problem. Negative for arthralgias.       Right foot redness and  swelling  Skin:  Negative for itching and rash.  Neurological:  Positive for gait problem. Negative for extremity weakness, headaches, numbness and speech difficulty.  Hematological:  Negative for adenopathy. Bruises/bleeds easily.  Psychiatric/Behavioral:  Negative for confusion.       MEDICAL HISTORY:  Past Medical History:  Diagnosis Date   Allergy    Cancer (HCC)    lung   COPD (chronic obstructive pulmonary disease) (HCC)    Dyspnea    former smoker. 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   Malignant neoplasm metastatic to brain [facility])    Peripheral vascular disease (HCC)    Pre-diabetes    Sleep apnea     SURGICAL HISTORY: Past Surgical History:  Procedure Laterality Date   ANTERIOR CERVICAL DECOMP/DISCECTOMY FUSION N/A 03/14/2020   Procedure: ANTERIOR CERVICAL DECOMPRESSION/DISCECTOMY FUSION 3 LEVELS C4-7;  Surgeon: Jodeen Munch, MD;  Location: [facility] ORS;  Service: Neurosurgery;  Laterality: N/A;   BACK SURGERY  2011   CENTRAL LINE INSERTION Right 09/10/2016   Procedure: CENTRAL LINE INSERTION;  Surgeon: Jackquelyn Mass, MD;  Location: [facility] ORS;  Service: Vascular;  Laterality: Right;   CERVICAL FUSION     C4-c7 on Mar 14, 2020   COLONOSCOPY  2013 ?   COLONOSCOPY N/A 09/09/2021   Procedure: COLONOSCOPY;  Surgeon: Quintin Buckle, DO;  Location: [facility] ENDOSCOPY;  Service: Gastroenterology;  Laterality: N/A;   CORONARY ANGIOPLASTY     ESOPHAGOGASTRODUODENOSCOPY  N/A 09/09/2021   Procedure: ESOPHAGOGASTRODUODENOSCOPY (EGD);  Surgeon: Quintin Buckle, DO;  Location: [facility] ENDOSCOPY;  Service: Gastroenterology;  Laterality: N/A;   FEMORAL-POPLITEAL BYPASS GRAFT Right 09/10/2016   Procedure: BYPASS GRAFT FEMORAL-POPLITEAL ARTERY ( BELOW KNEE );  Surgeon: Jackquelyn Mass, MD;  Location: [facility] ORS;  Service: Vascular;  Laterality: Right;   FEMORAL-TIBIAL BYPASS GRAFT Right 03/23/2020   Procedure: BYPASS  GRAFT RIGHT FEMORAL- DISTAL TIBIAL ARTERY WITH DISTAL FLOW GRAFT;  Surgeon: Jackquelyn Mass, MD;  Location: [facility] ORS;  Service: Vascular;  Laterality: Right;   LEFT HEART CATH AND CORONARY ANGIOGRAPHY Left 05/16/2021   Procedure: LEFT HEART CATH AND CORONARY ANGIOGRAPHY;  Surgeon: Link Rice, MD;  Location: [facility] INVASIVE CV LAB;  Service: Cardiovascular;  Laterality: Left;   LOWER EXTREMITY ANGIOGRAPHY Right 11/18/2016   Procedure: Lower Extremity Angiography;  Surgeon: Jackquelyn Mass, MD;  Location: [facility] INVASIVE CV LAB;  Service: Cardiovascular;  Laterality: Right;   LOWER EXTREMITY ANGIOGRAPHY Right 12/09/2016   Procedure: Lower Extremity Angiography;  Surgeon: Jackquelyn Mass, MD;  Location: [facility] INVASIVE CV LAB;  Service: Cardiovascular;  Laterality: Right;   LOWER EXTREMITY ANGIOGRAPHY Right 11/15/2019   Procedure: LOWER EXTREMITY ANGIOGRAPHY;  Surgeon: Jackquelyn Mass, MD;  Location: [facility] INVASIVE CV LAB;  Service: Cardiovascular;  Laterality: Right;   LOWER EXTREMITY ANGIOGRAPHY Right 12/01/2019   Procedure: Lower Extremity Angiography;  Surgeon: Celso College, MD;  Location: [facility] INVASIVE CV LAB;  Service: Cardiovascular;  Laterality: Right;   LOWER EXTREMITY ANGIOGRAPHY Right 12/02/2019   Procedure: LOWER EXTREMITY ANGIOGRAPHY;  Surgeon: Jackquelyn Mass, MD;  Location: [facility] INVASIVE CV LAB;  Service: Cardiovascular;  Laterality: Right;   LOWER EXTREMITY ANGIOGRAPHY Right 03/23/2020   Procedure: Lower Extremity Angiography;  Surgeon: Jackquelyn Mass, MD;  Location: [facility] INVASIVE CV LAB;  Service: Cardiovascular;  Laterality: Right;   LOWER EXTREMITY ANGIOGRAPHY Right 09/25/2021   Procedure: Lower Extremity Angiography;  Surgeon: Jackquelyn Mass, MD;  Location: [facility] INVASIVE CV LAB;  Service: Cardiovascular;  Laterality: Right;   LOWER EXTREMITY ANGIOGRAPHY Right 09/26/2021   Procedure: Lower Extremity Angiography;  Surgeon: Celso College, MD;  Location: [facility] INVASIVE CV  LAB;  Service: Cardiovascular;  Laterality: Right;   LOWER EXTREMITY ANGIOGRAPHY Right 04/08/2022   Procedure: Lower Extremity Angiography;  Surgeon: Jackquelyn Mass, MD;  Location: [facility] INVASIVE CV LAB;  Service: Cardiovascular;  Laterality: Right;   LOWER EXTREMITY INTERVENTION  11/18/2016   Procedure: Lower Extremity Intervention;  Surgeon: Jackquelyn Mass, MD;  Location: [facility] INVASIVE CV LAB;  Service: Cardiovascular;;   PERIPHERAL VASCULAR CATHETERIZATION Right 01/02/2015   Procedure: Lower Extremity Angiography;  Surgeon: Jackquelyn Mass, MD;  Location: [facility] INVASIVE CV LAB;  Service: Cardiovascular;  Laterality: Right;   PERIPHERAL VASCULAR CATHETERIZATION Right 06/18/2015   Procedure: Lower Extremity Angiography;  Surgeon: Celso College, MD;  Location: [facility] INVASIVE CV LAB;  Service: Cardiovascular;  Laterality: Right;   PERIPHERAL VASCULAR CATHETERIZATION  06/18/2015   Procedure: Lower Extremity Intervention;  Surgeon: Celso College, MD;  Location: [facility] INVASIVE CV LAB;  Service: Cardiovascular;;   PERIPHERAL VASCULAR CATHETERIZATION N/A 10/09/2015   Procedure: Abdominal Aortogram w/Lower Extremity;  Surgeon: Jackquelyn Mass, MD;  Location: [facility] INVASIVE CV LAB;  Service: Cardiovascular;  Laterality: N/A;   PERIPHERAL VASCULAR CATHETERIZATION  10/09/2015   Procedure: Lower Extremity Intervention;  Surgeon: Jackquelyn Mass, MD;  Location: [facility] INVASIVE CV LAB;  Service: Cardiovascular;;   PERIPHERAL VASCULAR CATHETERIZATION Right 10/10/2015   Procedure:  Lower Extremity Angiography;  Surgeon: Celso College, MD;  Location: [facility] INVASIVE CV LAB;  Service: Cardiovascular;  Laterality: Right;   PERIPHERAL VASCULAR CATHETERIZATION Right 10/10/2015   Procedure: Lower Extremity Intervention;  Surgeon: Celso College, MD;  Location: [facility] INVASIVE CV LAB;  Service: Cardiovascular;  Laterality: Right;   PERIPHERAL VASCULAR CATHETERIZATION Right 12/03/2015   Procedure: Lower Extremity Angiography;  Surgeon:  Celso College, MD;  Location: [facility] INVASIVE CV LAB;  Service: Cardiovascular;  Laterality: Right;   PERIPHERAL VASCULAR CATHETERIZATION Right 12/04/2015   Procedure: Lower Extremity Angiography;  Surgeon: Celso College, MD;  Location: [facility] INVASIVE CV LAB;  Service: Cardiovascular;  Laterality: Right;   PERIPHERAL VASCULAR CATHETERIZATION  12/04/2015   Procedure: Lower Extremity Intervention;  Surgeon: Celso College, MD;  Location: [facility] INVASIVE CV LAB;  Service: Cardiovascular;;   PERIPHERAL VASCULAR CATHETERIZATION Right 06/30/2016   Procedure: Lower Extremity Angiography;  Surgeon: Celso College, MD;  Location: [facility] INVASIVE CV LAB;  Service: Cardiovascular;  Laterality: Right;   PERIPHERAL VASCULAR CATHETERIZATION Right 06/25/2016   Procedure: Lower Extremity Angiography;  Surgeon: Jackquelyn Mass, MD;  Location: [facility] INVASIVE CV LAB;  Service: Cardiovascular;  Laterality: Right;   PERIPHERAL VASCULAR CATHETERIZATION Right 07/01/2016   Procedure: Lower Extremity Angiography;  Surgeon: Jackquelyn Mass, MD;  Location: [facility] INVASIVE CV LAB;  Service: Cardiovascular;  Laterality: Right;   PERIPHERAL VASCULAR CATHETERIZATION  07/01/2016   Procedure: Lower Extremity Intervention;  Surgeon: Jackquelyn Mass, MD;  Location: [facility] INVASIVE CV LAB;  Service: Cardiovascular;;   PERIPHERAL VASCULAR CATHETERIZATION Right 08/01/2016   Procedure: Lower Extremity Angiography;  Surgeon: Jackquelyn Mass, MD;  Location: [facility] INVASIVE CV LAB;  Service: Cardiovascular;  Laterality: Right;   PORTA CATH INSERTION N/A 10/05/2020   Procedure: PORTA CATH INSERTION;  Surgeon: Jackquelyn Mass, MD;  Location: [facility] INVASIVE CV LAB;  Service: Cardiovascular;  Laterality: N/A;   stent placement in right leg Right    VIDEO BRONCHOSCOPY N/A 09/14/2020   Procedure: VIDEO BRONCHOSCOPY WITH FLUORO;  Surgeon: Marc Senior, MD;  Location: [facility] ORS;  Service: Cardiopulmonary;  Laterality: N/A;   VIDEO BRONCHOSCOPY WITH  ENDOBRONCHIAL ULTRASOUND N/A 09/14/2020   Procedure: VIDEO BRONCHOSCOPY WITH ENDOBRONCHIAL ULTRASOUND;  Surgeon: Marc Senior, MD;  Location: [facility] ORS;  Service: Cardiopulmonary;  Laterality: N/A;    SOCIAL HISTORY: Social History   Socioeconomic History   Marital status: Single    Spouse name: Not on file   Number of children: Not on file   Years of education: Not on file   Highest education level: Not on file  Occupational History   Occupation: unemployed  Tobacco Use   Smoking status: Former    Current packs/day: 0.00    Average packs/day: 2.0 packs/day for 38.0 years (76.0 ttl pk-yrs)    Types: Cigarettes    Start date: 12/19/1976    Quit date: 12/20/2014    Years since quitting: 9.0   Smokeless tobacco: Never   Tobacco comments:    quit smoking in 2017 most smoked 2   Vaping Use   Vaping status: Former  Substance and Sexual Activity   Alcohol use: Not Currently   Drug use: No   Sexual activity: Yes  Other Topics Concern   Not on file  Social History Narrative   Not on file   Social Drivers of Health   Financial Resource Strain: Low Risk  (07/27/2023)   Received from [facility]   Overall Financial Resource Strain (  CARDIA)    Difficulty of Paying Living Expenses: Not hard at all  Food Insecurity: No Food Insecurity (12/18/2023)   Hunger Vital Sign    Worried About Running Out of Food in the Last Year: Never true    Ran Out of Food in the Last Year: Never true  Transportation Needs: No Transportation Needs (12/18/2023)   PRAPARE - Administrator, Civil Service (Medical): No    Lack of Transportation (Non-Medical): No  Physical Activity: Insufficiently Active (05/29/2023)   Exercise Vital Sign    Days of Exercise per Week: 2 days    Minutes of Exercise per Session: 10 min  Stress: No Stress Concern Present (05/29/2023)   [employer] of Occupational Health - Occupational Stress Questionnaire    Feeling of Stress : Not at all   Social Connections: Socially Isolated (12/18/2023)   Social Connection and Isolation Panel [NHANES]    Frequency of Communication with Friends and Family: More than three times a week    Frequency of Social Gatherings with Friends and Family: More than three times a week    Attends Religious Services: Never    Database administrator or Organizations: No    Attends Banker Meetings: Never    Marital Status: Never married  Intimate Partner Violence: Not At Risk (12/18/2023)   Humiliation, Afraid, Rape, and Kick questionnaire    Fear of Current or Ex-Partner: No    Emotionally Abused: No    Physically Abused: No    Sexually Abused: No    FAMILY HISTORY: Family History  Problem Relation Age of Onset   Diabetes Mother    Hypertension Mother    Heart murmur Mother    Leukemia Mother    Throat cancer Maternal Grandmother    Colon cancer Neg Hx    Breast cancer Neg Hx     ALLERGIES:  has no known allergies.  MEDICATIONS:  Current Outpatient Medications  Medication Sig Dispense Refill   aspirin  EC 81 MG tablet Take 81 mg by mouth daily. Swallow whole.     clopidogrel  (PLAVIX ) 75 MG tablet TAKE 1 TABLET BY MOUTH DAILY 100 tablet 2   levETIRAcetam  (KEPPRA ) 1000 MG tablet Take 1 tablet (1,000 mg total) by mouth 2 (two) times daily. 60 tablet 2   levothyroxine  (SYNTHROID ) 125 MCG tablet Take 1 tablet (125 mcg total) by mouth daily before breakfast. 90 tablet 0   oxyCODONE -acetaminophen  (PERCOCET/ROXICET) 5-325 MG tablet Take 1 tablet by mouth every 6 (six) hours as needed for up to 3 days for moderate pain (pain score 4-6) or severe pain (pain score 7-10). 12 tablet 0   rosuvastatin  (CRESTOR ) 40 MG tablet Take 1 tablet (40 mg total) by mouth daily. 90 tablet 3   amoxicillin -clavulanate (AUGMENTIN ) 875-125 MG tablet Take 1 tablet by mouth 2 (two) times daily for 3 days. (Patient not taking: Reported on 12/25/2023) 6 tablet 0   cefUROXime  (CEFTIN ) 250 MG tablet Take 1 tablet (250  mg total) by mouth 2 (two) times daily with a meal. 14 tablet 0   doxycycline (VIBRA-TABS) 100 MG tablet Take 1 tablet (100 mg total) by mouth 2 (two) times daily for 7 days. 14 tablet 0   No current facility-administered medications for this visit.   Facility-Administered Medications Ordered in Other Visits  Medication Dose Route Frequency Provider Last Rate Last Admin   albuterol  (PROVENTIL ) (2.5 MG/3ML) 0.083% nebulizer solution 2.5 mg  2.5 mg Nebulization Once Marc Senior, MD  heparin  lock flush 100 unit/mL  500 Units Intracatheter Once PRN [name], MD         PHYSICAL EXAMINATION: ECOG PERFORMANCE STATUS: 1 - Symptomatic but completely ambulatory Vitals:   12/25/23 1116  BP: 122/82  Pulse: 99  Resp: 20  Temp: 97.9 F (36.6 C)  SpO2: 100%    Filed Weights   12/25/23 1116  Weight: 210 lb 3.2 oz (95.3 kg)     Physical Exam Constitutional:      General: He is not in acute distress.    Appearance: He is obese.  HENT:     Head: Normocephalic and atraumatic.  Eyes:     General: No scleral icterus. Cardiovascular:     Rate and Rhythm: Normal rate and regular rhythm.  Pulmonary:     Effort: Pulmonary effort is normal. No respiratory distress.     Breath sounds: Normal breath sounds. No wheezing.  Abdominal:     General: There is no distension.     Palpations: Abdomen is soft.     Tenderness: There is no abdominal tenderness.  Musculoskeletal:     Cervical back: Normal range of motion and neck supple.     Right lower leg: Edema present.     Comments: Right lower extremity redness and swelling.  See picture.  Skin:    General: Skin is warm and dry.     Findings: Bruising present. No erythema.  Neurological:     Mental Status: He is alert and oriented to person, place, and time. Mental status is at baseline.     Cranial Nerves: No cranial nerve deficit.     Motor: No abnormal muscle tone.  Psychiatric:        Mood and Affect: Mood and affect normal.          LABORATORY DATA:  I have reviewed the data as listed    Latest Ref Rng & Units 12/25/2023   12:36 PM 12/25/2023   10:56 AM 12/22/2023    2:18 AM  CBC  WBC 4.0 - 10.5 K/uL 7.8  7.0  5.9   Hemoglobin 13.0 - 17.0 g/dL 09.8  11.9  14.7   Hematocrit 39.0 - 52.0 % 40.7  38.4  34.8   Platelets 150 - 400 K/uL 336  316  253       Latest Ref Rng & Units 12/25/2023   12:36 PM 12/25/2023   10:56 AM 12/22/2023    2:18 AM  CMP  Glucose 70 - 99 mg/dL 829  562  130   BUN 8 - 23 mg/dL 9  9  8    Creatinine 0.61 - 1.24 mg/dL 8.65  7.84  6.96   Sodium 135 - 145 mmol/L 139  136  139   Potassium 3.5 - 5.1 mmol/L 3.4  2.9  2.9   Chloride 98 - 111 mmol/L 105  104  103   CO2 22 - 32 mmol/L 26  23  25    Calcium  8.9 - 10.3 mg/dL 8.8  8.1  8.4   Total Protein 6.5 - 8.1 g/dL 6.9  6.6    Total Bilirubin 0.0 - 1.2 mg/dL 0.9  0.7    Alkaline Phos 38 - 126 U/L 78  86    AST 15 - 41 U/L 32  32    ALT 0 - 44 U/L 17  17       RADIOGRAPHIC STUDIES: I have personally reviewed the radiological images as listed and agreed with the findings in the report.  DG Foot 2 Views Right Result Date: 12/25/2023 CLINICAL DATA:  Foot wound EXAM: RIGHT FOOT - 2 VIEW COMPARISON:  None Available. FINDINGS: There is no evidence of fracture or dislocation. There is no evidence of arthropathy or other focal bone abnormality. Soft tissues soft tissue swelling of the dorsum IMPRESSION: Soft tissue swelling of the dorsum of the foot. Electronically Signed   By: Fredrich Jefferson M.D.   On: 12/25/2023 13:05   DG Foot 2 Views Right Result Date: 12/17/2023 CLINICAL DATA:  Cellulitis. Right foot pain for 1 month. Wound to big toe. Redness and swelling. EXAM: RIGHT FOOT - 2 VIEW COMPARISON:  None Available. FINDINGS: Generalized soft tissue edema. Site of wound is not well delineated by radiograph. No erosive or bony destructive change. No fracture. Mild degenerative change of the first metatarsal phalangeal joint. No soft tissue gas or  radiopaque foreign body. Small plantar calcaneal spur. IMPRESSION: 1. Generalized soft tissue edema. No radiographic evidence of osteomyelitis. 2. Mild degenerative change of the first metatarsophalangeal joint. Electronically Signed   By: Chadwick Colonel M.D.   On: 12/17/2023 23:29   DG Chest 2 View Result Date: 12/17/2023 CLINICAL DATA:  Suspected sepsis EXAM: CHEST - 2 VIEW COMPARISON:  02/10/2022 FINDINGS: Right Port-A-Cath remains in place, unchanged. Heart mediastinal contours within normal limits. No acute confluent airspace opacities or effusions. Post treatment scarring noted in the right suprahilar region, unchanged. No acute bony abnormality. IMPRESSION: No active cardiopulmonary disease. Electronically Signed   By: Janeece Mechanic M.D.   On: 12/17/2023 21:05   US  Venous Img Lower Unilateral Right (DVT) Result Date: 11/18/2023 CLINICAL DATA:  Right leg swelling EXAM: RIGHT LOWER EXTREMITY VENOUS DOPPLER ULTRASOUND TECHNIQUE: Gray-scale sonography with compression, as well as color and duplex ultrasound, were performed to evaluate the deep venous system(s) from the level of the common femoral vein through the popliteal and proximal calf veins. COMPARISON:  None Available. FINDINGS: VENOUS Normal compressibility of the common femoral, superficial femoral, and popliteal veins, as well as the visualized calf veins. Visualized portions of profunda femoral vein and great saphenous vein unremarkable. No filling defects to suggest DVT on grayscale or color Doppler imaging. Doppler waveforms show normal direction of venous flow, normal respiratory plasticity and response to augmentation. Limited views of the contralateral common femoral vein are unremarkable. 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 Limitations: none IMPRESSION: 1. No findings of right lower extremity DVT. 2. Suspected occluded graft distal to the common femoral artery, as suggested on  CT scan of 10/14/2023 and older CT scans. If workup of the right lower extremity arterial system is indicated, a CT angiogram runoff study could be performed. Electronically Signed   By: Freida Jes M.D.   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 Status post chemoradiation. Enhancing lesions noted on brain MRI in 2023. Status post SRS. EXAM: MRI HEAD WITHOUT AND WITH CONTRAST TECHNIQUE: Multiplanar, multiecho pulse sequences of the brain and surrounding structures were obtained without and with intravenous contrast. CONTRAST:  10mL GADAVIST  GADOBUTROL  1 MMOL/ML IV SOLN COMPARISON:  None Available. FINDINGS: Brain: 1.4 x 1.4 x 1.6 cm enhancing mass in the posterior aspect of the left frontal operculum previously measuring 1.8 x 1.6 x 2.2 cm when remeasured in a similar manner (series 1200 image 197, series 9  image 26). Additional centrally necrotic enhancing lesion in the left parietal lobe which measures 3.5 x 2.9 x 2.4 cm, previously 4.5 x 3.1 x 2.9 cm (series 1200, image 241). There is decreased enhancement along the inferior margin of the lesion. Centrally necrotic component appears slightly decreased in size but appears to occupy a greater portion of the lesion compared to prior. Similar enhancement along the superficial/superior margin of the lesion with similar extension to the dura. Redemonstration of extensive vasogenic edema in the left frontoparietal lobes which is overall decreased compared to the prior study with decreased gyral expansion. There is likely slightly decreased involvement of the left basal ganglia and external capsule. Approximately 4 mm rightward midline shift, previously 7 mm. 0.8 cm peripherally enhancing lesion in the right occipital lobe, previously measuring 1.0 cm. Surrounding vasogenic edema is similar to prior.  There is a new linear focus of enhancement over the convexity of the left inferior parietal lobule which may reflect a non suppressed vessel (series 1200 image 210). No nodular or masslike enhancement in this region. Otherwise no new enhancing lesions noted. No acute infarct. Similar chronic microhemorrhage in the mid/posterior left cingulate gyrus. Similar chronic blood products associated with the left cerebral lesions. The basilar cisterns are patent. No extra-axial fluid collection. Vascular: Normal flow voids. Skull and upper cervical spine: Normal marrow signal. Sinuses/Orbits: Focal mucosal thickening in the right posterior ethmoid air cells. Other: None. IMPRESSION: Compared to 10/09/2023 there is decreased size of the left frontal operculum lesion and left parietal lesion. Decreased vasogenic edema in the left frontoparietal lobes with decreased gyral expansion. 4 mm rightward midline shift, previously 7 mm. Similar appearance of right occipital lesion. Focal curvilinear enhancement over the convexity of the inferior left parietal lobe which may reflect a non suppressed vessel. Recommend attention on follow-up. Electronically Signed   By: Denny Flack M.D.   On: 11/09/2023 20:28   NM PET Metabolic Brain Result Date: 10/20/2023 CLINICAL DATA:  Dementia. 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 Full-ring PET imaging was performed from the vertex to skull base. CT data was obtained and used for attenuation correction and anatomic localization. Post processing fusion of PET data set with brain MRI 10/09/2023. FASTING BLOOD GLUCOSE:  Value: 88 mg/dl COMPARISON:  Brain MRI 10/09/2018 FINDINGS: Focus of hypermetabolic activity in the posterior lateral LEFT frontal lobe which is slightly greater than cortical metabolism (image 58). This corresponds to enhancing lesion on comparison MRI. On coronal orientation  the most intense portion the lesion along the craniad surface of the lesion. Thin rim of peripheral hypermetabolic activity associated with the high LEFT parietal enhancing lesion (image 24 through 31 PET data set). This rim of metabolic activity is slightly greater than adjacent normal cortical metabolic activity. IMPRESSION: Findings concerning for tumor recurrence within the LEFT posterior lateral frontal lobe and high LEFT parietal lobe. Electronically Signed   By: Deboraha Fallow M.D.   On: 10/20/2023 12:30   CT CHEST ABDOMEN PELVIS W CONTRAST Result Date: 10/18/2023 CLINICAL DATA:  Lung cancer restaging * Tracking Code: BO * EXAM: CT CHEST, ABDOMEN, AND PELVIS WITH CONTRAST TECHNIQUE: Multidetector CT imaging of the chest, abdomen and pelvis was performed following the standard protocol during bolus administration of intravenous contrast. RADIATION DOSE REDUCTION: This exam was performed according to the departmental dose-optimization program which includes automated exposure control, adjustment of the mA and/or kV according to patient size and/or use of iterative  reconstruction technique. CONTRAST:  OMNIPAQUE  IOHEXOL  300 MG/ML  SOLN COMPARISON:  06/25/2023 FINDINGS: CT CHEST FINDINGS Cardiovascular: Right chest port catheter. Scattered aortic atherosclerosis. Normal heart size. Scattered left and right coronary artery calcifications. No pericardial effusion. Mediastinum/Nodes: No enlarged mediastinal, hilar, or axillary lymph nodes. Thyroid  gland, trachea, and esophagus demonstrate no significant findings. Lungs/Pleura: Unchanged post treatment/post radiation appearance of the right lung with perihilar and paramedian fibrosis and consolidation. Minimal paraseptal emphysema. No pleural effusion or pneumothorax. Musculoskeletal: No chest wall abnormality. No acute osseous findings. CT ABDOMEN PELVIS FINDINGS Hepatobiliary: No solid liver abnormality is seen. Hepatic steatosis. No gallstones,  gallbladder wall thickening, or biliary dilatation. Pancreas: Unremarkable. No pancreatic ductal dilatation or surrounding inflammatory changes. Spleen: Normal in size without significant abnormality. Adrenals/Urinary Tract: Adrenal glands are unremarkable. Kidneys are normal, without renal calculi, solid lesion, or hydronephrosis. Bladder is unremarkable. Stomach/Bowel: Stomach is within normal limits. Appendix appears normal. No evidence of bowel wall thickening, distention, or inflammatory changes. Sigmoid diverticulosis. Vascular/Lymphatic: Aortic atherosclerosis. Partially imaged right superficial femoral and profunda system stenting. No enlarged abdominal or pelvic lymph nodes. Reproductive: No mass or other abnormality. Other: No abdominal wall hernia or abnormality. No ascites. Musculoskeletal: No acute osseous findings. IMPRESSION: 1. Unchanged post treatment/post radiation appearance of the right lung with perihilar and paramedian fibrosis and consolidation. 2. No evidence of lymphadenopathy or metastatic disease in the chest, abdomen, or pelvis. 3. Hepatic steatosis. 4. Coronary artery disease. Aortic Atherosclerosis (ICD10-I70.0) and Emphysema (ICD10-J43.9). Electronically Signed   By: Fredricka Jenny M.D.   On: 10/18/2023 08:12   MR BRAIN W WO CONTRAST Result Date: 10/09/2023 CLINICAL DATA:  Brain/CNS neoplasm, assess treatment response. History of metastatic lung cancer. EXAM: MRI HEAD WITHOUT AND WITH CONTRAST TECHNIQUE: Multiplanar, multiecho pulse sequences of the brain and surrounding structures were obtained without and with intravenous contrast. CONTRAST:  10mL GADAVIST  GADOBUTROL  1 MMOL/ML IV SOLN COMPARISON:  Head MRI 08/21/2023 FINDINGS: Brain: A heterogeneously enhancing, partially necrotic lesion in the posterior aspect of the left frontal operculum has an unchanged maximal oblique diameter of 2.2 cm on coronal images (series 16, image 18), however the lesion appears slightly larger on  axial and sagittal images (for example, 1.8 cm AP diameter on axial series 15, image 108 compared to 1.5 cm previously). A partially necrotic lesion in the left parietal lobe demonstrates more substantial enlargement and thickness of peripheral enhancement, now measuring up to 4.5 cm (series 15, image 144, previously 3 cm when measured in a similar fashion). This again extends to the dura. Extensive vasogenic edema in the left parietal and frontal lobes extending into the basal ganglia region has not significantly changed with unchanged 7 mm of rightward midline shift. A 1.0 cm peripherally enhancing lesion in the right occipital lobe is unchanged in size with unchanged minimal surrounding edema (series 15, image 59). Persistent punctate enhancement at the site of a known left cerebellar lesion has increased in intensity but not substantially enlarged, and there is no associated edema (series 15, image 38). No new enhancing brain lesion, acute infarct, or extra-axial fluid collection is identified. The ventricles are unchanged in size without evidence of hydrocephalus. There are chronic blood products associated with the 2 left cerebral lesions, and there is also an unchanged focus of chronic microhemorrhage at the mid to posterior left cingulate gyrus. Vascular: Major intracranial vascular flow voids are preserved. Skull and upper cervical spine: No suspicious marrow lesion. Sinuses/Orbits: Unremarkable orbits. Mild posterior right ethmoid air cell opacification. No significant mastoid  fluid. Other: None. IMPRESSION: 1. Slight enlargement of the lesion in the posterior left frontal operculum and more substantial enlargement of the left parietal lesion with unchanged extensive edema and 7 mm of rightward midline shift. 2. No evidence of new intracranial metastases. Electronically Signed   By: Aundra Lee M.D.   On: 10/09/2023 13:29

## 2023-12-25 NOTE — ED Triage Notes (Addendum)
 C/o R big toe, foot wound. Pt has completed PO ABX and wound is still red and has pus like drainage. Dog also stepped on toe, re-injuring it. Cap refill <3, bandage in place. GCS 15. PMH: Brain Ca, actively receiving radiation,  lung CA, COPD, HTN

## 2023-12-25 NOTE — ED Provider Notes (Signed)
 [facility] Provider Note   Event Date/Time   First MD Initiated Contact with Patient 12/25/23 1451     (approximate) History  Wound Check  HPI DESTEN MANOR is a 68 y.o. male with a past medical history of peripheral vascular disease with chronic wounds overlying the right great toe and up the right foot.  Patient was recently seen in our facility and discharged over a week ago with concerns of cellulitis overlying this toe and right foot.  Patient has been taking Augmentin  and doxycycline with good relief of his pain and swelling however after stopping this medication he feels that these symptoms are returning.  Patient complains of spreading redness around the wound as well as foul smelling yellow discharge from the right great toe ROS: Patient currently denies any vision changes, tinnitus, difficulty speaking, facial droop, sore throat, chest pain, shortness of breath, abdominal pain, nausea/vomiting/diarrhea, dysuria, or weakness/numbness/paresthesias in any extremity   Physical Exam  Triage Vital Signs: ED Triage Vitals  Encounter Vitals Group     BP 12/25/23 1230 130/88     Systolic BP Percentile --      Diastolic BP Percentile --      Pulse Rate 12/25/23 1230 100     Resp 12/25/23 1230 16     Temp 12/25/23 1228 97.9 F (36.6 C)     Temp Source 12/25/23 1228 Oral     SpO2 12/25/23 1230 100 %     Weight --      Height 12/25/23 1231 5\' 11"  (1.803 m)     Head Circumference --      Peak Flow --      Pain Score 12/25/23 1231 0     Pain Loc --      Pain Education --      Exclude from Growth Chart --    Most recent vital signs: Vitals:   12/25/23 1630 12/25/23 1700  BP: 134/79 128/82  Pulse:  100  Resp:    Temp:    SpO2:     General: Awake, oriented x4. CV:  Good peripheral perfusion. Resp:  Normal effort. Abd:  No distention. Other:  Elderly overweight Caucasian male resting comfortably in no acute distress.  Erythema and induration to  the right great toe with multiple areas of erythema over the dorsum of the right foot ED Results / Procedures / Treatments  Labs (all labs ordered are listed, but only abnormal results are displayed) Labs Reviewed  CBC WITH DIFFERENTIAL/PLATELET - Abnormal; Notable for the following components:      Result Value   RDW 16.5 (*)    All other components within normal limits  COMPREHENSIVE METABOLIC PANEL WITH GFR - Abnormal; Notable for the following components:   Potassium 3.4 (*)    Glucose, Bld 125 (*)    Calcium  8.8 (*)    Albumin  3.2 (*)    All other components within normal limits   RADIOLOGY ED MD interpretation: X-ray of the right foot shows soft tissue swelling of the dorsum of the foot - All radiology independently interpreted and agree with radiology assessment Official radiology report(s): DG Foot 2 Views Right Result Date: 12/25/2023 CLINICAL DATA:  Foot wound EXAM: RIGHT FOOT - 2 VIEW COMPARISON:  None Available. FINDINGS: There is no evidence of fracture or dislocation. There is no evidence of arthropathy or other focal bone abnormality. Soft tissues soft tissue swelling of the dorsum IMPRESSION: Soft tissue swelling of the dorsum of the foot. Electronically  Signed   By: Fredrich Jefferson M.D.   On: 12/25/2023 13:05   PROCEDURES: Critical Care performed: No Procedures MEDICATIONS ORDERED IN ED: Medications  heparin  lock flush 100 unit/mL (500 Units Intravenous Given 12/25/23 1705)   IMPRESSION / MDM / ASSESSMENT AND PLAN / ED COURSE  I reviewed the triage vital signs and the nursing notes.                             The patient is on the cardiac monitor to evaluate for evidence of arrhythmia and/or significant heart rate changes. Patient's presentation is most consistent with acute presentation with potential threat to life or bodily function. Presentation most consistent with simple cellulitis. Given History, Exam, and Workup I have low suspicion for Necrotizing Fasciitis,  Abscess, Osteomyelitis, DVT or other emergent problem as a cause for this presentation.  Rx: Ceftin , doxycycline 100 mg twice daily x5 days  Disposition: Discharge. No evidence of serious bacterial illness. Nontoxic appearing, VSS. Low risk for treatment failure based on history. Strict return precautions discussed with patient with full understanding. Advised patient to follow up promptly with primary care provider within next 48 hours.   FINAL CLINICAL IMPRESSION(S) / ED DIAGNOSES   Final diagnoses:  Encounter for wound re-check   Rx / DC Orders   ED Discharge Orders          Ordered    cefUROXime  (CEFTIN ) 250 MG tablet  2 times daily with meals        12/25/23 1523    doxycycline (VIBRA-TABS) 100 MG tablet  2 times daily        12/25/23 1523           Note:  This document was prepared using Dragon voice recognition software and may include unintentional dictation errors.   Charleen Conn, MD 12/25/23 [phone]

## 2023-12-25 NOTE — Assessment & Plan Note (Signed)
 continue levothyroxine 125 mcg daily

## 2023-12-26 ENCOUNTER — Other Ambulatory Visit (HOSPITAL_BASED_OUTPATIENT_CLINIC_OR_DEPARTMENT_OTHER): Payer: Self-pay

## 2023-12-26 MED FILL — Levetiracetam Tab 1000 MG: ORAL | 30 days supply | Qty: 60 | Fill #0 | Status: AC

## 2023-12-27 ENCOUNTER — Other Ambulatory Visit: Payer: Self-pay

## 2023-12-28 ENCOUNTER — Other Ambulatory Visit: Payer: Self-pay | Admitting: Radiation Therapy

## 2023-12-29 ENCOUNTER — Ambulatory Visit
Admission: RE | Admit: 2023-12-29 | Discharge: 2023-12-29 | Disposition: A | Discharge status: Home / Self Care | Source: Ambulatory Visit | Attending: Internal Medicine | Admitting: Internal Medicine

## 2023-12-29 ENCOUNTER — Telehealth: Payer: Self-pay | Admitting: *Deleted

## 2023-12-29 DIAGNOSIS — C7931 Secondary malignant neoplasm of brain: Secondary | ICD-10-CM | POA: Insufficient documentation

## 2023-12-29 DIAGNOSIS — Z452 Encounter for adjustment and management of vascular access device: Secondary | ICD-10-CM | POA: Insufficient documentation

## 2023-12-29 DIAGNOSIS — Z510 Encounter for antineoplastic radiation therapy: Secondary | ICD-10-CM | POA: Insufficient documentation

## 2023-12-29 DIAGNOSIS — C3411 Malignant neoplasm of upper lobe, right bronchus or lung: Secondary | ICD-10-CM | POA: Insufficient documentation

## 2023-12-29 NOTE — Telephone Encounter (Signed)
 Patient called about chronic wounds right great toe up to the right foot.  He is asking if Dr. Wilhelmenia Harada can put in a referral for the wound center

## 2023-12-29 NOTE — Telephone Encounter (Signed)
 I called the patient and let him know that I sent it to MD and nurse and they will get back to him. He is ok with this.

## 2023-12-30 ENCOUNTER — Ambulatory Visit: Payer: Medicare Other | Admitting: Pulmonary Disease

## 2023-12-30 ENCOUNTER — Other Ambulatory Visit

## 2023-12-30 ENCOUNTER — Ambulatory Visit

## 2023-12-30 ENCOUNTER — Other Ambulatory Visit: Payer: Self-pay

## 2023-12-30 ENCOUNTER — Ambulatory Visit: Admitting: Oncology

## 2023-12-30 DIAGNOSIS — L03115 Cellulitis of right lower limb: Secondary | ICD-10-CM

## 2023-12-30 DIAGNOSIS — S81809A Unspecified open wound, unspecified lower leg, initial encounter: Secondary | ICD-10-CM

## 2023-12-31 ENCOUNTER — Encounter: Payer: Self-pay | Admitting: Oncology

## 2023-12-31 ENCOUNTER — Encounter (INDEPENDENT_AMBULATORY_CARE_PROVIDER_SITE_OTHER)

## 2023-12-31 ENCOUNTER — Ambulatory Visit (INDEPENDENT_AMBULATORY_CARE_PROVIDER_SITE_OTHER): Admitting: Vascular Surgery

## 2024-01-04 ENCOUNTER — Ambulatory Visit (HOSPITAL_COMMUNITY)
Admission: RE | Admit: 2024-01-04 | Discharge: 2024-01-04 | Disposition: A | Discharge status: Home / Self Care | Source: Ambulatory Visit | Attending: Internal Medicine | Admitting: Internal Medicine

## 2024-01-04 DIAGNOSIS — C7931 Secondary malignant neoplasm of brain: Secondary | ICD-10-CM | POA: Insufficient documentation

## 2024-01-04 DIAGNOSIS — R9082 White matter disease, unspecified: Secondary | ICD-10-CM | POA: Diagnosis not present

## 2024-01-04 DIAGNOSIS — G9389 Other specified disorders of brain: Secondary | ICD-10-CM | POA: Diagnosis not present

## 2024-01-04 DIAGNOSIS — C801 Malignant (primary) neoplasm, unspecified: Secondary | ICD-10-CM | POA: Diagnosis not present

## 2024-01-04 MED ORDER — GADOBUTROL 1 MMOL/ML IV SOLN
9.0000 mL | Freq: Once | INTRAVENOUS | Status: AC | PRN
  Administered 2024-01-04: 9 mL via INTRAVENOUS

## 2024-01-07 ENCOUNTER — Encounter (INDEPENDENT_AMBULATORY_CARE_PROVIDER_SITE_OTHER): Payer: Medicare Other

## 2024-01-07 ENCOUNTER — Other Ambulatory Visit: Payer: Self-pay

## 2024-01-07 ENCOUNTER — Ambulatory Visit (INDEPENDENT_AMBULATORY_CARE_PROVIDER_SITE_OTHER): Payer: Medicare Other | Admitting: Vascular Surgery

## 2024-01-08 ENCOUNTER — Inpatient Hospital Stay (HOSPITAL_BASED_OUTPATIENT_CLINIC_OR_DEPARTMENT_OTHER): Admitting: Internal Medicine

## 2024-01-08 ENCOUNTER — Inpatient Hospital Stay

## 2024-01-08 ENCOUNTER — Other Ambulatory Visit: Payer: Self-pay

## 2024-01-08 ENCOUNTER — Encounter: Payer: Self-pay | Admitting: Oncology

## 2024-01-08 ENCOUNTER — Inpatient Hospital Stay (HOSPITAL_BASED_OUTPATIENT_CLINIC_OR_DEPARTMENT_OTHER): Admitting: Oncology

## 2024-01-08 ENCOUNTER — Ambulatory Visit

## 2024-01-08 VITALS — BP 129/83 | HR 95 | Temp 97.1°F | Resp 18 | Wt 200.3 lb

## 2024-01-08 VITALS — BP 146/85

## 2024-01-08 DIAGNOSIS — C3491 Malignant neoplasm of unspecified part of right bronchus or lung: Secondary | ICD-10-CM

## 2024-01-08 DIAGNOSIS — L03115 Cellulitis of right lower limb: Secondary | ICD-10-CM

## 2024-01-08 DIAGNOSIS — S81809A Unspecified open wound, unspecified lower leg, initial encounter: Secondary | ICD-10-CM

## 2024-01-08 DIAGNOSIS — E876 Hypokalemia: Secondary | ICD-10-CM

## 2024-01-08 DIAGNOSIS — Z95828 Presence of other vascular implants and grafts: Secondary | ICD-10-CM

## 2024-01-08 DIAGNOSIS — C3411 Malignant neoplasm of upper lobe, right bronchus or lung: Secondary | ICD-10-CM | POA: Diagnosis not present

## 2024-01-08 DIAGNOSIS — C7931 Secondary malignant neoplasm of brain: Secondary | ICD-10-CM

## 2024-01-08 DIAGNOSIS — S91109A Unspecified open wound of unspecified toe(s) without damage to nail, initial encounter: Secondary | ICD-10-CM

## 2024-01-08 LAB — COMPREHENSIVE METABOLIC PANEL WITH GFR
ALT: 32 U/L (ref 0–44)
AST: 53 U/L — ABNORMAL HIGH (ref 15–41)
Albumin: 3.3 g/dL — ABNORMAL LOW (ref 3.5–5.0)
Alkaline Phosphatase: 90 U/L (ref 38–126)
Anion gap: 11 (ref 5–15)
BUN: 7 mg/dL — ABNORMAL LOW (ref 8–23)
CO2: 26 mmol/L (ref 22–32)
Calcium: 8.9 mg/dL (ref 8.9–10.3)
Chloride: 99 mmol/L (ref 98–111)
Creatinine, Ser: 1.06 mg/dL (ref 0.61–1.24)
GFR, Estimated: >60 mL/min (ref >60)
Glucose, Bld: 161 mg/dL — ABNORMAL HIGH (ref 70–99)
Potassium: 2.8 mmol/L — ABNORMAL LOW (ref 3.5–5.1)
Sodium: 136 mmol/L (ref 135–145)
Total Bilirubin: 0.8 mg/dL (ref 0.0–1.2)
Total Protein: 7.2 g/dL (ref 6.5–8.1)

## 2024-01-08 LAB — CBC WITH DIFFERENTIAL/PLATELET
Abs Immature Granulocytes: 0.01 10*3/uL (ref 0.00–0.07)
Basophils Absolute: 0.1 10*3/uL (ref 0.0–0.1)
Basophils Relative: 2 %
Eosinophils Absolute: 0.2 10*3/uL (ref 0.0–0.5)
Eosinophils Relative: 3 %
HCT: 42 % (ref 39.0–52.0)
Hemoglobin: 13.4 g/dL (ref 13.0–17.0)
Immature Granulocytes: 0 %
Lymphocytes Relative: 15 %
Lymphs Abs: 0.9 10*3/uL (ref 0.7–4.0)
MCH: 28.1 pg (ref 26.0–34.0)
MCHC: 31.9 g/dL (ref 30.0–36.0)
MCV: 88.1 fL (ref 80.0–100.0)
Monocytes Absolute: 0.6 10*3/uL (ref 0.1–1.0)
Monocytes Relative: 10 %
Neutro Abs: 4.1 10*3/uL (ref 1.7–7.7)
Neutrophils Relative %: 70 %
Platelets: 249 10*3/uL (ref 150–400)
RBC: 4.77 MIL/uL (ref 4.22–5.81)
RDW: 15.3 % (ref 11.5–15.5)
WBC: 5.9 10*3/uL (ref 4.0–10.5)
nRBC: 0 % (ref 0.0–0.2)

## 2024-01-08 MED ORDER — HEPARIN SOD (PORK) LOCK FLUSH 100 UNIT/ML IV SOLN
500.0000 [IU] | Freq: Once | INTRAVENOUS | Status: AC
  Administered 2024-01-08: 500 [IU] via INTRAVENOUS
  Filled 2024-01-08: qty 5

## 2024-01-08 MED ORDER — SODIUM CHLORIDE 0.9 % IV SOLN
INTRAVENOUS | Status: DC
  Filled 2024-01-08: qty 250

## 2024-01-08 MED ORDER — POTASSIUM CHLORIDE 20 MEQ/100ML IV SOLN
20.0000 meq | Freq: Once | INTRAVENOUS | Status: AC
  Administered 2024-01-08: 20 meq via INTRAVENOUS

## 2024-01-08 MED ORDER — POTASSIUM CHLORIDE CRYS ER 20 MEQ PO TBCR
20.0000 meq | EXTENDED_RELEASE_TABLET | Freq: Every day | ORAL | 0 refills | Status: DC
Start: 2024-01-08 — End: 2024-01-25
  Filled 2024-01-08: qty 17, 17d supply, fill #0
  Filled 2024-01-08: qty 13, 13d supply, fill #0

## 2024-01-08 MED ORDER — AMOXICILLIN-POT CLAVULANATE 875-125 MG PO TABS
1.0000 | ORAL_TABLET | Freq: Two times a day (BID) | ORAL | 0 refills | Status: DC
  Filled 2024-01-08: qty 14, 7d supply, fill #0

## 2024-01-08 NOTE — Progress Notes (Signed)
 [facility] at [facility] [address]  [place], [place] [address] [phone]   Interval Evaluation  Date of Service: 01/08/24 Patient Name: Joshua [name] Patient MRN: [id number] Patient DOB: 05-20-1956 Provider: Mamie Searles, MD  Identifying Statement:  Joshua [name] is a 68 y.o. male with Malignant neoplasm metastatic to brain [facility])    Primary Cancer:  Oncologic History: Oncology History  Squamous cell carcinoma of right lung (HCC)  09/18/2020 Imaging   PET scan showed right upper lobe nodule maximal SUV 11.5.  Hypermetabolic cluster right paratracheal adenopathy with maximum SUV up to 12.  No findings of metastatic disease to the neck/abdomen/pelvis or skeleton.   09/20/2020 Initial Diagnosis   Squamous cell carcinoma of right lung (HCC) NGS showed PD-L1 TPS 95%, LRP1B S969fs, PIK3CA E545K, RB1 c1422-2A>T, RIT1 T83_ A84del, STK11 P28fs, TPS53 R248W, TMB 19.74mut/mb, MS stable.    09/20/2020 Cancer Staging   Staging form: Lung, AJCC 8th Edition - Clinical stage from 09/20/2020: Stage IV (cT1b, cN2, cM1) - Signed by [name], MD on 12/13/2021   10/03/2020 Imaging   MRI brain with and without contrast showed no evidence of intracranial metastatic disease. Well-defined round lesion within the left parotid tail measuring approximately 2.3 x 1.8 x 1.8 cm   10/09/2020 - 11/27/2020 Radiation Therapy   concurrent chemoradiation   10/10/2020 - 10/31/2020 Chemotherapy   10/10/2020 concurrent chemoradiation.  carboplatin  AUC of 2 and Taxol  45 mg/m2 x 4.  Additional chemotherapy was held due to thrombocytopenia, neutropenia       12/31/2020 - 03/21/2022 Chemotherapy   Maintenance  Durvalumab  q14d      12/31/2020 -  Chemotherapy   Patient is on Treatment Plan : LUNG Durvalumab  (10) q14d     03/28/2021 Imaging   CT without contrast showed new linear right perihilar consolidation with associated traction bronchiectasis.  Likely postradiation changes.   Additional new peribronchial vascular/nodular groundglass opacities with areas of consolidation or seen primarily in the right upper and lower lobes, more distant from expected radiation fields. 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  Per my discussion with pulmonology Dr. 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     05/09/2021 Imaging   Repeat CT chest without contrast showed evolving radiation changes in the right perihilar region likely accounting for interval increase to thickening of the posterior wall of the right upper lobe bronchus.  The more peripheral right lung groundglass opacities seen on most recent study have generally improved with exception of one peripherally right upper lobe which appears new.  These are likely inflammatory.  No findings highly suspicious for local recurrence.   05/30/2021 - 01/10/2022 Chemotherapy   Durvalumab  q14d     10/07/2021 Imaging   MRI brain with and without contrast showed 2 contrast-enhancing lesions, most consistent with metastatic disease.  Largest lesion is in the left frontal lobe and measures up to 1.3 cm with a large amount of surrounding edema and 9 mm rightward midline shift.  The other enhancing lesion was 4 mm in the right occipital lobe with a small amount of surrounding edema.  No acute or chronic hemorrhage.   10/07/2021 Progression   Headache after colonoscopy, progressive difficulty using her right arm.  Progressively worsening of speech. Stage IV lung cancer with brain metastasis.  started on Dexamethasone .  He was seen by neurosurgery and radiation oncology.  Recommendation is  to proceed with SRS.  Patient was discharged with dexamethasone    10/16/2021 Imaging   PET scan showed postradiation changes in the right supra hilar lung without evidence of local recurrence.  No evidence of lung cancer recurrence  or metastatic disease.  Single focus of intense metabolic activity associated with posterior left 11th rib likely secondary to recent fall/posttraumatic metabolic changes.  Frontal  uptake in the left frontal lobe corresponding to the known metastatic lesion in the brain   10/29/2021 -  Radiation Therapy   Brain Radiation [facility]   12/10/2021 Imaging   Brain MRI: Continued interval decrease in size of a left frontal lobe metastasis, now measuring 10 mm. Moderate surrounding edema has also decreased.  3-4 mm metastasis within the right occipital lobe, not significantly changed in size. There is now a small focus of central non enhancement within this lesion. Increased edema. 3 mm enhancing metastasis within the high left parietal lobe. In retrospect, this was likely present as a subtle punctate focus of enhancement on the prior brain MRI of 10/22/2021.   01/18/2022 Imaging   CT chest abdomen pelvis w contrast 1. Stable post treatment changes in the right upper lobe medially with dense radiation fibrosis but no findings to suggest local recurrent tumor. 2. Significant progression of ill-defined ground-glass opacity, interstitial thickening and airspace nodularity in the right upper lobe and upper aspect of the right lower lobe. Findings could reflect an inflammatory or atypical infectious process, drug induced pneumonitis or interstitial spread of tumor.3. No mediastinal or hilar mass or adenopathy. 4. No findings for abdominal/pelvic metastatic disease or osseous metastatic disease.   02/19/2022 Imaging   MRI brain w wo contrast showed Three metastatic deposits in the brain appear smaller. Decreased edema in the left parietal lobe. No new lesions    04/04/2022 Imaging   CT chest w contrast 1. Stable radiation changes in the right perihilar region without evidence of local recurrence. 2. Interval improvement in the patchy ground-glass opacities previously demonstrated in both lungs, most consistent with a  resolving infectious or inflammatory process (including immunotherapy-related pneumonitis). 3. No evidence of metastatic disease.   05/14/2022 Miscellaneous   He had a mechanical fall and fractured his left elbow. xrays showed a left elbow fracture  05/26/22 seen by orthopedic surgeon, cast applied.    05/27/2022 Imaging   MRI brain w wo contrast 1. All three known brain metastases have enlarged since August, with increasing regional edema. And there is a small new left anterior cerebellar 4-5 mm metastasis (was questionably punctate on the May MRI). This constellation could reflect a combination of True Progression (Left Cerebellar lesion) and pseudoprogression/radionecrosis (3 existing mets).    2. No significant intracranial mass effect. 3. Underlying moderate cerebral white matter signal changes.   05/30/2022 Imaging   CT chest abdomen pelvis w contrast  1. 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 There is a stable small right pleural effusion, but no definitive findings to suggest residual or recurrent disease on today's examination. 2. No signs of metastatic disease in the abdomen or pelvis.  3. Aortic atherosclerosis, in addition to two-vessel coronary artery disease. Please note that although the presence of coronary artery calcium  documents the presence of coronary artery disease, the severity of this disease and any potential stenosis cannot be assessed on this non-gated CT examination. Assessment for potential risk factor modification, dietary therapy or pharmacologic therapy may be warranted, if clinically indicated.  4. Hepatic steatosis. 5. Colonic diverticulosis without evidence of acute diverticulitis at this time. 6. Incompletely imaged but apparently occluded vascular graft in the superior aspect of the right anterior thigh. Referral to vascular specialist  for further clinical evaluation should be considered if clinically appropriate. 7. Additional incidental findings, as above.   07/22/2022 Imaging   MRI brain  1. No new or enlarging lesions. No intracranial disease progression. 2. Decreased size of treated lesions of the left frontoparietal junction and left postcentral gyrus. 3. Unchanged lesions in the left cerebellum and right occipital lobe.   10/15/2022 Imaging   MRI brain w wo contrast showed Interval increase in size of multiple intraparenchymal lesions with worsened surrounding vasogenic edema. 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 No new lesions   11/05/2022 Imaging   MRI brain w wo contrast  1. Likely slight increase and right occipital and left cerebellar lesions. Otherwise, unchanged metastases, as above. No new lesions identified. 2. Partially imaged large probable lymph node in the left upper neck, apparent on prior PET CT.   11/13/2022 -  Radiation Therapy   Each of the sites were treated with SRS/SBRT/SRT-IMRT technique to 20 Gy in 1 fraction to each of the following sites: PTV_2_LCerebel_60mm PTV_3_RCerebel_89mm PTV_4_ROccip_49mm PTV_5_LParietal_16mm   11/27/2022 Imaging   CT chest abdomen pelvis w contrast showed  Improving bilateral scattered interstitial and ground-glass opacities. Small amount of residual areas in the extreme left lower lobe inferiorly. This specific area is slightly increased from previous but overall the amount of opacities is significantly improved. Recommend continued follow-up. No developing new mass lesion, fluid collection or lymph node enlargement. Stable posttreatment changes right perihilar in the lung. Fatty liver infiltration. Colonic diverticulosis. Improved tiny right pleural effusion   02/05/2023 Imaging   MRI brain with and without contrast Interval decrease in all of the metastatic lesions, detailed above.No  new lesions identified.    Imaging   CT chest abdomen pelvis w contrast showed  1. 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 2. Trace right pleural fluid or thickening, new or minimally increased. 3. Incidental findings, including: Aortic atherosclerosis (ICD10-I70.0), coronary artery atherosclerosis and emphysema (ICD10-J43.9). Hepatic steatosis.   06/25/2023 Imaging   CT chest abdomen pelvis w contrast showed 1. Unchanged post treatment/post radiation appearance of the right lung, with perihilar and suprahilar paramedian fibrosis and consolidation. 2. No evidence of lymphadenopathy or metastatic disease in the chest, abdomen, or pelvis. 3. Hepatic steatosis. 4. Coronary artery disease.   Aortic Atherosclerosis (ICD10-I70.0) and Emphysema (ICD10-J43.9).   08/26/2023 [facility] Admission   hospitalization due to was observed speech and jerking movements on right side despite being on dexamethasone . Continuous EEG suggest cortical dysfunction arising from left temporal region.  No seizure or elective form discharges were seen. Patient was discharged on dexamethasone  4 mg, Keppra , lamotrigine .    11/04/2023 -  Chemotherapy   Add Bevacizumab  5mg /kg to Durvalumab .     11/17/2023 - 11/27/2023 Radiation Therapy   Patient received radiation to brain for treatment of recurrence.    CNS Oncologic History 10/29/21: SRS to left frontal metastasis ([place]) 11/13/22: Salvage SRS x5 with Dr. Jeryl Moris 11/27/23: Re-treated progressive L parietal and L frontal 30gy/5 with concurrent avastin  Jeryl Moris)  Interval History: Joshua [name] presents today for follow up after recent MRI brain, now having completed repeat radiation for 2 progressive left hemisphere metastases.  Right arm and leg strength have improved  overall, but his right leg has been very limited by complicated cellulitis infection.  He remains with a small open wound,  still on antibiotics (Augmentin ).  Still dosing Keppra  1000mg  BID, decadron  is off currently.  No further seizure events. Remains on hold Imfinzi  immunotherapy with Dr. Wilhelmenia Harada.  H+P (06/20/22) Patient presents today for evaluation for neurologic complaints.  He describes gradual onset of impaired speech output and right arm greater than right leg weakness.  This led to a fall and an injury to his left arm.  He was started on decadron  two weeks ago, this has led to considerable improvement in symptoms.  Today he feels at his prior baseline.  No issues tolerating the steroids.  He is due for durvalumab  infusion today with Dr. Wilhelmenia Harada.   Medications: Current Outpatient Medications on File Prior to Visit  Medication Sig Dispense Refill   amoxicillin -clavulanate (AUGMENTIN ) 875-125 MG tablet Take 1 tablet by mouth 2 (two) times daily. 14 tablet 0   aspirin  EC 81 MG tablet Take 81 mg by mouth daily. Swallow whole.     clopidogrel  (PLAVIX ) 75 MG tablet TAKE 1 TABLET BY MOUTH DAILY 100 tablet 2   levETIRAcetam  (KEPPRA ) 1000 MG tablet Take 1 tablet (1,000 mg total) by mouth 2 (two) times daily. 60 tablet 2   levothyroxine  (SYNTHROID ) 125 MCG tablet Take 1 tablet (125 mcg total) by mouth daily before breakfast. 90 tablet 0   potassium chloride  SA (KLOR-CON  M) 20 MEQ tablet Take 1 tablet (20 mEq total) by mouth daily. 30 tablet 0   rosuvastatin  (CRESTOR ) 40 MG tablet Take 1 tablet (40 mg total) by mouth daily. 90 tablet 3   Current Facility-Administered Medications on File Prior to Visit  Medication Dose Route Frequency Provider Last Rate Last Admin   albuterol  (PROVENTIL ) (2.5 MG/3ML) 0.083% nebulizer solution 2.5 mg  2.5 mg Nebulization Once Marc Senior, MD       heparin  lock flush 100 unit/mL  500 Units Intracatheter Once PRN [name], MD        Allergies: No Known Allergies Past Medical History:  Past Medical History:  Diagnosis Date   Allergy    Cancer (HCC)    lung   COPD (chronic obstructive  pulmonary disease) (HCC)    Dyspnea    former smoker. 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   Malignant neoplasm metastatic to brain [facility])    Peripheral vascular disease (HCC)    Pre-diabetes    Sleep apnea    Past Surgical History:  Past Surgical History:  Procedure Laterality Date   ANTERIOR CERVICAL DECOMP/DISCECTOMY FUSION N/A 03/14/2020   Procedure: ANTERIOR CERVICAL DECOMPRESSION/DISCECTOMY FUSION 3 LEVELS C4-7;  Surgeon: Jodeen Munch, MD;  Location: [facility] ORS;  Service: Neurosurgery;  Laterality: N/A;   BACK SURGERY  2011   CENTRAL LINE INSERTION Right 09/10/2016   Procedure: CENTRAL LINE INSERTION;  Surgeon: Jackquelyn Mass, MD;  Location: [facility] ORS;  Service: Vascular;  Laterality: Right;   CERVICAL FUSION     C4-c7 on Mar 14, 2020   COLONOSCOPY  2013 ?   COLONOSCOPY N/A 09/09/2021   Procedure: COLONOSCOPY;  Surgeon: Quintin Buckle, DO;  Location: [facility] ENDOSCOPY;  Service: Gastroenterology;  Laterality: N/A;   CORONARY ANGIOPLASTY     ESOPHAGOGASTRODUODENOSCOPY N/A 09/09/2021   Procedure: ESOPHAGOGASTRODUODENOSCOPY (EGD);  Surgeon: Quintin Buckle, DO;  Location: [facility] END[facility]COPY;  Service: Gastroenterology;  Laterality: N/A;   FEMORAL-POPLITEAL  BYPASS GRAFT Right 09/10/2016   Procedure: BYPASS GRAFT FEMORAL-POPLITEAL ARTERY ( BELOW KNEE );  Surgeon: Jackquelyn Mass, MD;  Location: [facility] ORS;  Service: Vascular;  Laterality: Right;   FEMORAL-TIBIAL BYPASS GRAFT Right 03/23/2020   Procedure: BYPASS GRAFT RIGHT FEMORAL- DISTAL TIBIAL ARTERY WITH DISTAL FLOW GRAFT;  Surgeon: Jackquelyn Mass, MD;  Location: [facility] ORS;  Service: Vascular;  Laterality: Right;   LEFT HEART CATH AND CORONARY ANGIOGRAPHY Left 05/16/2021   Procedure: LEFT HEART CATH AND CORONARY ANGIOGRAPHY;  Surgeon: Link Rice, MD;  [facility] IN[facility]AS[facility] CV LAB;  Service: Cardiovascular;  Laterality: Left;   LOWER EXTREMITY  ANGIOGRAPHY Right 11/18/2016   Procedure: Lower Extremity Angiography;  Surgeon: Jackquelyn Mass, MD;  Location: [facility] INVASIVE CV LAB;  Service: Cardiovascular;  Laterality: Right;   LOWER EXTREMITY ANGIOGRAPHY Right 12/09/2016   Procedure: Lower Extremity Angiography;  Surgeon: Jackquelyn Mass, MD;  Location: [facility] INVASIVE CV LAB;  Service: Cardiovascular;  Laterality: Right;   LOWER EXTREMITY ANGIOGRAPHY Right 11/15/2019   Procedure: LOWER EXTREMITY ANGIOGRAPHY;  Surgeon: Jackquelyn Mass, MD;  Location: [facility] INVASIVE CV LAB;  Service: Cardiovascular;  Laterality: Right;   LOWER EXTREMITY ANGIOGRAPHY Right 12/01/2019   Procedure: Lower Extremity Angiography;  Surgeon: Celso College, MD;  Location: [facility] INVASIVE CV LAB;  Service: Cardiovascular;  Laterality: Right;   LOWER EXTREMITY ANGIOGRAPHY Right 12/02/2019   Procedure: LOWER EXTREMITY ANGIOGRAPHY;  Surgeon: Jackquelyn Mass, MD;  Location: [facility] INVASIVE CV LAB;  Service: Cardiovascular;  Laterality: Right;   LOWER EXTREMITY ANGIOGRAPHY Right 03/23/2020   Procedure: Lower Extremity Angiography;  Surgeon: Jackquelyn Mass, MD;  Location: [facility] INVASIVE CV LAB;  Service: Cardiovascular;  Laterality: Right;   LOWER EXTREMITY ANGIOGRAPHY Right 09/25/2021   Procedure: Lower Extremity Angiography;  Surgeon: Jackquelyn Mass, MD;  Location: [facility] INVASIVE CV LAB;  Service: Cardiovascular;  Laterality: Right;   LOWER EXTREMITY ANGIOGRAPHY Right 09/26/2021   Procedure: Lower Extremity Angiography;  Surgeon: Celso College, MD;  Location: [facility] INVASIVE CV LAB;  Service: Cardiovascular;  Laterality: Right;   LOWER EXTREMITY ANGIOGRAPHY Right 04/08/2022   Procedure: Lower Extremity Angiography;  Surgeon: Jackquelyn Mass, MD;  Location: [facility] INVASIVE CV LAB;  Service: Cardiovascular;  Laterality: Right;   LOWER EXTREMITY INTERVENTION  11/18/2016   Procedure: Lower Extremity Intervention;  Surgeon: Jackquelyn Mass, MD;  Location: [facility] INVASIVE CV LAB;   Service: Cardiovascular;;   PERIPHERAL VASCULAR CATHETERIZATION Right 01/02/2015   Procedure: Lower Extremity Angiography;  Surgeon: Jackquelyn Mass, MD;  Location: [facility] INVASIVE CV LAB;  Service: Cardiovascular;  Laterality: Right;   PERIPHERAL VASCULAR CATHETERIZATION Right 06/18/2015   Procedure: Lower Extremity Angiography;  Surgeon: Celso College, MD;  Location: [facility] INVASIVE CV LAB;  Service: Cardiovascular;  Laterality: Right;   PERIPHERAL VASCULAR CATHETERIZATION  06/18/2015   Procedure: Lower Extremity Intervention;  Surgeon: Celso College, MD;  Location: [facility] INVASIVE CV LAB;  Service: Cardiovascular;;   PERIPHERAL VASCULAR CATHETERIZATION N/A 10/09/2015   Procedure: Abdominal Aortogram w/Lower Extremity;  Surgeon: Jackquelyn Mass, MD;  Location: [facility] INVASIVE CV LAB;  Service: Cardiovascular;  Laterality: N/A;   PERIPHERAL VASCULAR CATHETERIZATION  10/09/2015   Procedure: Lower Extremity Intervention;  Surgeon: Jackquelyn Mass, MD;  Location: [facility] INVASIVE CV LAB;  Service: Cardiovascular;;   PERIPHERAL VASCULAR CATHETERIZATION Right 10/10/2015   Procedure: Lower Extremity Angiography;  Surgeon: Celso College, MD;  Location: [facility] INVASIVE CV LAB;  Service: Cardiovascular;  Laterality: Right;   PERIPHERAL VASCULAR CATHETERIZATION  Right 10/10/2015   Procedure: Lower Extremity Intervention;  Surgeon: Celso College, MD;  Location: [facility] INVASIVE CV LAB;  Service: Cardiovascular;  Laterality: Right;   PERIPHERAL VASCULAR CATHETERIZATION Right 12/03/2015   Procedure: Lower Extremity Angiography;  Surgeon: Celso College, MD;  Location: [facility] INVASIVE CV LAB;  Service: Cardiovascular;  Laterality: Right;   PERIPHERAL VASCULAR CATHETERIZATION Right 12/04/2015   Procedure: Lower Extremity Angiography;  Surgeon: Celso College, MD;  Location: [facility] INVASIVE CV LAB;  Service: Cardiovascular;  Laterality: Right;   PERIPHERAL VASCULAR CATHETERIZATION  12/04/2015   Procedure: Lower Extremity Intervention;  Surgeon:  Celso College, MD;  Location: [facility] INVASIVE CV LAB;  Service: Cardiovascular;;   PERIPHERAL VASCULAR CATHETERIZATION Right 06/30/2016   Procedure: Lower Extremity Angiography;  Surgeon: Celso College, MD;  Location: [facility] INVASIVE CV LAB;  Service: Cardiovascular;  Laterality: Right;   PERIPHERAL VASCULAR CATHETERIZATION Right 06/25/2016   Procedure: Lower Extremity Angiography;  Surgeon: Jackquelyn Mass, MD;  Location: [facility] INVASIVE CV LAB;  Service: Cardiovascular;  Laterality: Right;   PERIPHERAL VASCULAR CATHETERIZATION Right 07/01/2016   Procedure: Lower Extremity Angiography;  Surgeon: Jackquelyn Mass, MD;  Location: [facility] INVASIVE CV LAB;  Service: Cardiovascular;  Laterality: Right;   PERIPHERAL VASCULAR CATHETERIZATION  07/01/2016   Procedure: Lower Extremity Intervention;  Surgeon: Jackquelyn Mass, MD;  Location: [facility] INVASIVE CV LAB;  Service: Cardiovascular;;   PERIPHERAL VASCULAR CATHETERIZATION Right 08/01/2016   Procedure: Lower Extremity Angiography;  Surgeon: Jackquelyn Mass, MD;  Location: [facility] INVASIVE CV LAB;  Service: Cardiovascular;  Laterality: Right;   PORTA CATH INSERTION N/A 10/05/2020   Procedure: PORTA CATH INSERTION;  Surgeon: Jackquelyn Mass, MD;  Location: [facility] INVASIVE CV LAB;  Service: Cardiovascular;  Laterality: N/A;   stent placement in right leg Right    VIDEO BRONCHOSCOPY N/A 09/14/2020   Procedure: VIDEO BRONCHOSCOPY WITH FLUORO;  Surgeon: Marc Senior, MD;  Location: [facility] ORS;  Service: Cardiopulmonary;  Laterality: N/A;   VIDEO BRONCHOSCOPY WITH ENDOBRONCHIAL ULTRASOUND N/A 09/14/2020   Procedure: VIDEO BRONCHOSCOPY WITH ENDOBRONCHIAL ULTRASOUND;  Surgeon: Marc Senior, MD;  Location: [facility] ORS;  Service: Cardiopulmonary;  Laterality: N/A;   Social History:  Social History   Socioeconomic History   Marital status: Single    Spouse name: Not on file   Number of children: Not on file   Years of education: Not on file   Highest education  level: Not on file  Occupational History   Occupation: unemployed  Tobacco Use   Smoking status: Former    Current packs/day: 0.00    Average packs/day: 2.0 packs/day for 38.0 years (76.0 ttl pk-yrs)    Types: Cigarettes    Start date: 12/19/1976    Quit date: 12/20/2014    Years since quitting: 9.0   Smokeless tobacco: Never   Tobacco comments:    quit smoking in 2017 most smoked 2   Vaping Use   Vaping status: Former  Substance and Sexual Activity   Alcohol  use: Not Currently   Drug use: No   Sexual activity: Yes  Other Topics Concern   Not on file  Social History Narrative   Not on file   Social Drivers of Health   Financial Resource Strain: Low Risk  (07/27/2023)   Received from [facility]   Overall Financial Resource Strain (CARDIA)    Difficulty of Paying Living Expenses: Not hard at all  Food Insecurity: No Food Insecurity (12/18/2023)   Hunger Vital Sign  Worried About Programme researcher, broadcasting/film/video in the Last Year: Never true    Ran Out of Food in the Last Year: Never true  Transportation Needs: No Transportation Needs (12/18/2023)   PRAPARE - Administrator, Civil Service (Medical): No    Lack of Transportation (Non-Medical): No  Physical Activity: Insufficiently Active (05/29/2023)   Exercise Vital Sign    Days of Exercise per Week: 2 days    Minutes of Exercise per Session: 10 min  Stress: No Stress Concern Present (05/29/2023)   [employer] of Occupational Health - Occupational Stress Questionnaire    Feeling of Stress : Not at all  Social Connections: Socially Isolated (12/18/2023)   Social Connection and Isolation Panel    Frequency of Communication with Friends and Family: More than three times a week    Frequency of Social Gatherings with Friends and Family: More than three times a week    Attends Religious Services: Never    Database administrator or Organizations: No    Attends Banker Meetings: Never    Marital  Status: Never married  Intimate Partner Violence: Not At Risk (12/18/2023)   Humiliation, Afraid, Rape, and Kick questionnaire    Fear of Current or Ex-Partner: No    Emotionally Abused: No    Physically Abused: No    Sexually Abused: No   Family History:  Family History  Problem Relation Age of Onset   Diabetes Mother    Hypertension Mother    Heart murmur Mother    Leukemia Mother    Throat cancer Maternal Grandmother    Colon cancer Neg Hx    Breast cancer Neg Hx     Review of Systems: Constitutional: Doesn't report fevers, chills or abnormal weight loss Eyes: Doesn't report blurriness of vision Ears, nose, mouth, throat, and face: Doesn't report sore throat Respiratory: Doesn't report cough, dyspnea or wheezes Cardiovascular: Doesn't report palpitation, chest discomfort  Gastrointestinal:  Doesn't report nausea, constipation, diarrhea GU: Doesn't report incontinence Skin: Doesn't report skin rashes Neurological: Per HPI Musculoskeletal: Doesn't report joint pain Behavioral/Psych: Doesn't report anxiety  Physical Exam: Wt Readings from Last 3 Encounters:  01/08/24 200 lb 4.8 oz (90.9 kg)  12/25/23 210 lb 3.2 oz (95.3 kg)  12/18/23 212 lb 15.4 oz (96.6 kg)   Temp Readings from Last 3 Encounters:  01/08/24 (!) 97.1 F (36.2 C) (Tympanic)  12/25/23 97.9 F (36.6 C) (Oral)  12/25/23 97.9 F (36.6 C)   BP Readings from Last 3 Encounters:  01/08/24 129/83  12/25/23 128/82  12/25/23 122/82   Pulse Readings from Last 3 Encounters:  01/08/24 95  12/25/23 100  12/25/23 99     KPS: 70. General: Alert, cooperative, pleasant, in no acute distress Head: Normal EENT: No conjunctival injection or scleral icterus.  Lungs: Resp effort normal Cardiac: Regular rate Abdomen: Non-distended abdomen Skin: Cellulitis right lower leg Extremities: Left arm in sling  Neurologic Exam: Mental Status: Awake, alert, attentive to examiner. Oriented to self and environment.  Language is fluent with intact comprehension.  Cranial Nerves: Visual acuity is grossly normal. Visual fields are full. Extra-ocular movements intact. No ptosis. Face is symmetric Motor: Tone and bulk are normal. Power is 4+/5 in right arm and leg, with some impairment in fine motor function in right hand. Reflexes are symmetric, no pathologic reflexes present.  Sensory: Intact to light touch Gait: Hemiparetic   Labs: I have reviewed the data as listed    Component Value Date/Time  NA 136 01/08/2024 0804   NA 138 06/02/2023 1049   K 2.8 (L) 01/08/2024 0804   CL 99 01/08/2024 0804   CO2 26 01/08/2024 0804   GLUCOSE 161 (H) 01/08/2024 0804   BUN 7 (L) 01/08/2024 0804   BUN 18 06/02/2023 1049   BUN 16 07/03/2014 1205   CREATININE 1.06 01/08/2024 0804   CREATININE 1.11 11/06/2022 1041   CREATININE 1.34 (H) 07/03/2014 1205   CALCIUM  8.9 01/08/2024 0804   PROT 7.2 01/08/2024 0804   PROT 7.0 04/16/2020 1525   ALBUMIN  3.3 (L) 01/08/2024 0804   ALBUMIN  4.3 04/16/2020 1525   AST 53 (H) 01/08/2024 0804   ALT 32 01/08/2024 0804   ALKPHOS 90 01/08/2024 0804   BILITOT 0.8 01/08/2024 0804   BILITOT 0.4 04/16/2020 1525   GFRNONAA >60 01/08/2024 0804   GFRNONAA >60 11/06/2022 1041   GFRNONAA 59 (L) 07/03/2014 1205   GFRAA >60 03/30/2020 0536   GFRAA >60 07/03/2014 1205   Lab Results  Component Value Date   WBC 5.9 01/08/2024   NEUTROABS 4.1 01/08/2024   HGB 13.4 01/08/2024   HCT 42.0 01/08/2024   MCV 88.1 01/08/2024   PLT 249 01/08/2024    Imaging:  [facility] Clinician Interpretation: I have personally reviewed the CNS images as listed.  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 EXAM: MRI HEAD WITHOUT AND WITH CONTRAST TECHNIQUE: Multiplanar, multiecho pulse sequences of the brain and surrounding  structures were obtained without and with intravenous contrast. CONTRAST:  9mL GADAVIST  GADOBUTROL  1 MMOL/ML IV SOLN COMPARISON:  MRI of the brain dated November 06, 2023. FINDINGS: Brain: A centrally necrotic, peripherally enhancing lesion within the left parietal lobe has decreased in size in the interim from approximately 3.5 x 2.9 x 2.4 cm to approximately 2.7 x 2.0 x 1.8 cm. A lesion within the left frontal operculum as marginally decreased in size in the interim from 1.4 x 1.4 x 1.6 cm to approximately 1.2 x 1.2 x 1.3 cm. A and nodule previously noted within the right occipital lobe has also marginally in decrease in size in the interim from 8 mm in diameter to 7 mm. The linear area of enhancement previously noted along the left inferior parietal lobe appears represent a vessel. There are no new or enlarging lesions present. There is moderate periventricular and deep cerebral white matter disease. There is gliosis also noted adjacent to the necrotic lesion within the left parietal lobe. Vascular: Normal vascular flow voids. Skull and upper cervical spine: No osseous lesions are evident. Normal bone marrow signal. Sinuses/Orbits: Mild mucosal disease within the right maxillary sinus. The orbits are unremarkable. Other: None. IMPRESSION: 1. Interval improvement of metastatic disease within the left parietal, left frontal and right occipital lobes. Electronically Signed   By: Maribeth Shivers M.D.   On: 01/04/2024 13:17   DG Foot 2 Views Right Result Date: 12/25/2023 CLINICAL DATA:  Foot wound EXAM: RIGHT FOOT - 2 VIEW COMPARISON:  None Available. FINDINGS: There is no evidence of fracture or dislocation. There is no evidence of arthropathy or other focal bone abnormality. Soft tissues soft tissue swelling of the dorsum IMPRESSION: Soft tissue swelling of the dorsum of the foot. Electronically Signed   By: Fredrich Jefferson M.D.   On: 12/25/2023 13:05   DG Foot 2 Views Right Result Date:  12/17/2023 CLINICAL DATA:   Cellulitis. Right foot pain for 1 month. Wound to big toe. Redness and swelling. EXAM: RIGHT FOOT - 2 VIEW COMPARISON:  None Available. FINDINGS: Generalized soft tissue edema. Site of wound is not well delineated by radiograph. No erosive or bony destructive change. No fracture. Mild degenerative change of the first metatarsal phalangeal joint. No soft tissue gas or radiopaque foreign body. Small plantar calcaneal spur. IMPRESSION: 1. Generalized soft tissue edema. No radiographic evidence of osteomyelitis. 2. Mild degenerative change of the first metatarsophalangeal joint. Electronically Signed   By: Chadwick Colonel M.D.   On: 12/17/2023 23:29   DG Chest 2 View Result Date: 12/17/2023 CLINICAL DATA:  Suspected sepsis EXAM: CHEST - 2 VIEW COMPARISON:  02/10/2022 FINDINGS: Right Port-A-Cath remains in place, unchanged. Heart mediastinal contours within normal limits. No acute confluent airspace opacities or effusions. Post treatment scarring noted in the right suprahilar region, unchanged. No acute bony abnormality. IMPRESSION: No active cardiopulmonary disease. Electronically Signed   By: Janeece Mechanic M.D.   On: 12/17/2023 21:05    Assessment/Plan Malignant neoplasm metastatic to brain [facility])  [name] is clinically improved today from focal neurologic standpoint.  He is limited by his extensive right leg infection, still healing.   MRI brain demonstrates very good response to recent radiotherapy, with decreased burden of enhancement and edema around both progressive lesions.  Discussed with Dr. Wilhelmenia Harada, recommended holding avastin  an additional 4 weeks to allow wounds to heal on right leg and antibiotic course to finish.  Can then transition to q4 week avastin  10mg /kg to guard against rebound inflammation, radiation necrosis.  He is agreeable with this.     Should remain off steroids for now if tolerated.  We appreciate the opportunity to participate in the care of [employer].    We ask that [name] return to clinic in 3 months following next brain MRI, or sooner as needed.  All questions were answered. The patient knows to call the clinic with any problems, questions or concerns. No barriers to learning were detected.  The total time spent in the encounter was 40 minutes and more than 50% was on counseling and review of test results   Mamie Searles, MD Medical Director of Neuro-Oncology [facility] 01/08/24 9:01 AM

## 2024-01-08 NOTE — Assessment & Plan Note (Addendum)
 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  PET brain finding suggests recurrence -NeuroOnc recommend Radiation and Bevacizumab   Discussed with Dr. Mark Sil.   MRI brain showed good response.  Hold Bevacizumab  today,plan to resume in 4 weeks for 10mg /kg monthly x 3 months then stop.

## 2024-01-08 NOTE — Assessment & Plan Note (Signed)
 IV potassium 20meq x 1 Recommend potassium 20meq daily.

## 2024-01-08 NOTE — Assessment & Plan Note (Addendum)
 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  Labs are reviewed and discussed with patient. Hold Durvalumab 

## 2024-01-08 NOTE — Patient Instructions (Signed)
 Potassium Chloride Injection What is this medication? POTASSIUM CHLORIDE (poe TASS i um KLOOR ide) prevents and treats low levels of potassium in your body. Potassium plays an important role in maintaining the health of your kidneys, heart, muscles, and nervous system. This medicine may be used for other purposes; ask your health care provider or pharmacist if you have questions. COMMON BRAND NAME(S): PROAMP What should I tell my care team before I take this medication? They need to know if you have any of these conditions: Addison disease Dehydration Diabetes (high blood sugar) Heart disease High levels of potassium in the blood Irregular heartbeat or rhythm Kidney disease Large areas of burned skin An unusual or allergic reaction to potassium, other medications, foods, dyes, or preservatives Pregnant or trying to get pregnant Breast-feeding How should I use this medication? This medication is injected into a vein. It is given in a hospital or clinic setting. Talk to your care team about the use of this medication in children. Special care may be needed. Overdosage: If you think you have taken too much of this medicine contact a poison control center or emergency room at once. NOTE: This medicine is only for you. Do not share this medicine with others. What if I miss a dose? This does not apply. This medication is not for regular use. What may interact with this medication? Do not take this medication with any of the following: Certain diuretics, such as spironolactone, triamterene Eplerenone Sodium polystyrene sulfonate This medication may also interact with the following: Certain medications for blood pressure or heart disease, such as lisinopril, losartan, quinapril, valsartan Medications that lower your chance of fighting infection, such as cyclosporine, tacrolimus NSAIDs, medications for pain and inflammation, such as ibuprofen or naproxen Other potassium supplements Salt  substitutes This list may not describe all possible interactions. Give your health care provider a list of all the medicines, herbs, non-prescription drugs, or dietary supplements you use. Also tell them if you smoke, drink alcohol, or use illegal drugs. Some items may interact with your medicine. What should I watch for while using this medication? Visit your care team for regular checks on your progress. Tell your care team if your symptoms do not start to get better or if they get worse. You may need blood work while you are taking this medication. Avoid salt substitutes unless you are told otherwise by your care team. What side effects may I notice from receiving this medication? Side effects that you should report to your care team as soon as possible: Allergic reactions--skin rash, itching, hives, swelling of the face, lips, tongue, or throat High potassium level--muscle weakness, fast or irregular heartbeat Side effects that usually do not require medical attention (report to your care team if they continue or are bothersome): Diarrhea Nausea Stomach pain Vomiting This list may not describe all possible side effects. Call your doctor for medical advice about side effects. You may report side effects to FDA at 1-800-FDA-1088. Where should I keep my medication? This medication is given in a hospital or clinic. It will not be stored at home. NOTE: This sheet is a summary. It may not cover all possible information. If you have questions about this medicine, talk to your doctor, pharmacist, or health care provider.  2024 Elsevier/Gold Standard (2022-01-17 00:00:00)

## 2024-01-08 NOTE — Assessment & Plan Note (Addendum)
 Improved after Ceftin , although he has GI side effects.  Still has redness and some discharge at the base of right big toe nail.  I recommend to switch to Augmentin  for another week. Follow up with wound care.  Refer to podiatry.

## 2024-01-08 NOTE — Progress Notes (Signed)
 Hematology/Oncology Progress note [phone] [phone]    CHIEF COMPLAINTS/REASON FOR VISIT:  Follow up for lung cancer  ASSESSMENT & PLAN:   Cancer Staging  Squamous cell carcinoma of right lung (HCC) Staging form: Lung, AJCC 8th Edition - Clinical stage from 09/20/2020: Stage IV (cT1b, cN2, cM1) - Signed by [name], MD on 12/13/2021   Squamous cell carcinoma of right lung (HCC) Durvalumab  was previously held due to pneumonitis.  Labs are reviewed and discussed with patient. 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  PET brain finding suggests recurrence -NeuroOnc recommend Radiation and Bevacizumab   Discussed with Dr. Mark Sil.   MRI brain showed good response.  Hold Bevacizumab  today,plan to resume in 4 weeks for 10mg /kg monthly x 3 months then stop.   Hypokalemia IV potassium 20meq x 1 Recommend potassium 20meq daily.   Cellulitis of right lower extremity Improved after Ceftin , although he has GI side effects.  Still has redness and some discharge at the base of right big toe nail.  I recommend to switch to Augmentin  for another week. Follow up with wound care.  Refer to podiatry.     Follow up 2 weeks lab MD Durvalumab  All questions were answered. The patient knows to call the clinic with any problems, questions or concerns.  [name], MD, PhD [facility] Hematology Oncology 01/08/2024       HISTORY OF PRESENTING ILLNESS:  Joshua [name] is a 68 y.o. male who has above history reviewed by me today presents for follow up visit for Stage IV lung squamous cell carcinoma. Oncology History  Squamous cell carcinoma of right lung (HCC)  09/18/2020 Imaging   PET scan showed right upper lobe nodule maximal SUV 11.5.  Hypermetabolic cluster right paratracheal adenopathy with maximum SUV up to 12.  No findings of metastatic disease to the neck/abdomen/pelvis or skeleton.   09/20/2020 Initial  Diagnosis   Squamous cell carcinoma of right lung (HCC) NGS showed PD-L1 TPS 95%, LRP1B S958fs, PIK3CA E545K, RB1 c1422-2A>T, RIT1 T83_ A84del, STK11 P265fs, TPS53 R248W, TMB 19.99mut/mb, MS stable.    09/20/2020 Cancer Staging   Staging form: Lung, AJCC 8th Edition - Clinical stage from 09/20/2020: Stage IV (cT1b, cN2, cM1) - Signed by [name], MD on 12/13/2021   10/03/2020 Imaging   MRI brain with and without contrast showed no evidence of intracranial metastatic disease. Well-defined round lesion within the left parotid tail measuring approximately 2.3 x 1.8 x 1.8 cm   10/09/2020 - 11/27/2020 Radiation Therapy   concurrent chemoradiation   10/10/2020 - 10/31/2020 Chemotherapy   10/10/2020 concurrent chemoradiation.  carboplatin  AUC of 2 and Taxol  45 mg/m2 x 4.  Additional chemotherapy was held due to thrombocytopenia, neutropenia       12/31/2020 - 03/21/2022 Chemotherapy   Maintenance  Durvalumab  q14d      12/31/2020 -  Chemotherapy   Patient is on Treatment Plan : LUNG Durvalumab  (10) q14d     03/28/2021 Imaging   CT without contrast showed new linear right perihilar consolidation with associated traction bronchiectasis.  Likely postradiation changes.  Additional new peribronchial vascular/nodular groundglass opacities with areas of consolidation or seen primarily in the right upper and lower lobes, more distant from expected radiation fields. 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  Per my discussion with pulmonology Dr. 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  05/09/2021 Imaging   Repeat CT chest without contrast showed evolving radiation changes in the right perihilar region likely accounting for interval increase to thickening of the posterior wall of the right upper lobe bronchus.  The more peripheral right lung groundglass opacities seen on most  recent study have generally improved with exception of one peripherally right upper lobe which appears new.  These are likely inflammatory.  No findings highly suspicious for local recurrence.   05/30/2021 - 01/10/2022 Chemotherapy   Durvalumab  q14d     10/07/2021 Imaging   MRI brain with and without contrast showed 2 contrast-enhancing lesions, most consistent with metastatic disease.  Largest lesion is in the left frontal lobe and measures up to 1.3 cm with a large amount of surrounding edema and 9 mm rightward midline shift.  The other enhancing lesion was 4 mm in the right occipital lobe with a small amount of surrounding edema.  No acute or chronic hemorrhage.   10/07/2021 Progression   Headache after colonoscopy, progressive difficulty using her right arm.  Progressively worsening of speech. Stage IV lung cancer with brain metastasis.  started on Dexamethasone .  He was seen by neurosurgery and radiation oncology.  Recommendation is to proceed with SRS.  Patient was discharged with dexamethasone    10/16/2021 Imaging   PET scan showed postradiation changes in the right supra hilar lung without evidence of local recurrence.  No evidence of lung cancer recurrence or metastatic disease.  Single focus of intense metabolic activity associated with posterior left 11th rib likely secondary to recent fall/posttraumatic metabolic changes.  Frontal  uptake in the left frontal lobe corresponding to the known metastatic lesion in the brain   10/29/2021 -  Radiation Therapy   Brain Radiation [facility]   12/10/2021 Imaging   Brain MRI: Continued interval decrease in size of a left frontal lobe metastasis, now measuring 10 mm. Moderate surrounding edema has also decreased.  3-4 mm metastasis within the right occipital lobe, not significantly changed in size. There is now a small focus of central non enhancement within this lesion. Increased edema. 3 mm enhancing metastasis within the high left parietal lobe. In  retrospect, this was likely present as a subtle punctate focus of enhancement on the prior brain MRI of 10/22/2021.   01/18/2022 Imaging   CT chest abdomen pelvis w contrast 1. Stable post treatment changes in the right upper lobe medially with dense radiation fibrosis but no findings to suggest local recurrent tumor. 2. Significant progression of ill-defined ground-glass opacity, interstitial thickening and airspace nodularity in the right upper lobe and upper aspect of the right lower lobe. Findings could reflect an inflammatory or atypical infectious process, drug induced pneumonitis or interstitial spread of tumor.3. No mediastinal or hilar mass or adenopathy. 4. No findings for abdominal/pelvic metastatic disease or osseous metastatic disease.   02/19/2022 Imaging   MRI brain w wo contrast showed Three metastatic deposits in the brain appear smaller. Decreased edema in the left parietal lobe. No new lesions    04/04/2022 Imaging   CT chest w contrast 1. Stable radiation changes in the right perihilar region without evidence of local recurrence. 2. Interval improvement in the patchy ground-glass opacities previously demonstrated in both lungs, most consistent with a resolving infectious or inflammatory process (including immunotherapy-related pneumonitis). 3. No evidence of metastatic disease.   05/14/2022 Miscellaneous   He had a mechanical fall and fractured his left elbow. xrays showed a left elbow fracture  05/26/22 seen by orthopedic surgeon, cast applied.  05/27/2022 Imaging   MRI brain w wo contrast 1. All three known brain metastases have enlarged since August, with increasing regional edema. And there is a small new left anterior cerebellar 4-5 mm metastasis (was questionably punctate on the May MRI). This constellation could reflect a combination of True Progression (Left Cerebellar lesion) and pseudoprogression/radionecrosis (3 existing mets).    2. No significant intracranial  mass effect. 3. Underlying moderate cerebral white matter signal changes.   05/30/2022 Imaging   CT chest abdomen pelvis w contrast  1. 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 There is a stable small right pleural effusion, but no definitive findings to suggest residual or recurrent disease on today's examination. 2. No signs of metastatic disease in the abdomen or pelvis.  3. Aortic atherosclerosis, in addition to two-vessel coronary artery disease. Please note that although the presence of coronary artery calcium  documents the presence of coronary artery disease, the severity of this disease and any potential stenosis cannot be assessed on this non-gated CT examination. Assessment for potential risk factor modification, dietary therapy or pharmacologic therapy may be warranted, if clinically indicated. 4. Hepatic steatosis. 5. Colonic diverticulosis without evidence of acute diverticulitis at this time. 6. Incompletely imaged but apparently occluded vascular graft in the superior aspect of the right anterior thigh. Referral to vascular specialist for further clinical evaluation should be considered if clinically appropriate. 7. Additional incidental findings, as above.   07/22/2022 Imaging   MRI brain  1. No new or enlarging lesions. No intracranial disease progression. 2. Decreased size of treated lesions of the left frontoparietal junction and left postcentral gyrus. 3. Unchanged lesions in the left cerebellum and right occipital lobe.   10/15/2022 Imaging   MRI brain w wo contrast showed Interval increase in size of multiple intraparenchymal lesions with worsened surrounding vasogenic edema. 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 No new lesions   11/05/2022 Imaging   MRI brain w wo contrast   1. Likely slight increase and right occipital and left cerebellar lesions. Otherwise, unchanged metastases, as above. No new lesions identified. 2. Partially imaged large probable lymph node in the left upper neck, apparent on prior PET CT.   11/13/2022 -  Radiation Therapy   Each of the sites were treated with SRS/SBRT/SRT-IMRT technique to 20 Gy in 1 fraction to each of the following sites: PTV_2_LCerebel_69mm PTV_3_RCerebel_25mm PTV_4_ROccip_91mm PTV_5_LParietal_70mm   11/27/2022 Imaging   CT chest abdomen pelvis w contrast showed  Improving bilateral scattered interstitial and ground-glass opacities. Small amount of residual areas in the extreme left lower lobe inferiorly. This specific area is slightly increased from previous but overall the amount of opacities is significantly improved. Recommend continued follow-up. No developing new mass lesion, fluid collection or lymph node enlargement. Stable posttreatment changes right perihilar in the lung. Fatty liver infiltration. Colonic diverticulosis. Improved tiny right pleural effusion   02/05/2023 Imaging   MRI brain with and without contrast Interval decrease in all of the metastatic lesions, detailed above.No new lesions identified.    Imaging   CT chest abdomen pelvis w contrast showed  1. 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 2. Trace right pleural fluid or thickening, new or minimally increased. 3. Incidental findings, including: Aortic atherosclerosis (ICD10-I70.0), coronary artery atherosclerosis and emphysema (ICD10-J43.9). Hepatic steatosis.   06/25/2023 Imaging   CT chest abdomen pelvis  w contrast showed 1. Unchanged post treatment/post radiation appearance of the right lung, with perihilar and suprahilar paramedian fibrosis and consolidation. 2. No evidence of lymphadenopathy or metastatic disease in the chest, abdomen, or pelvis. 3. Hepatic  steatosis. 4. Coronary artery disease.   Aortic Atherosclerosis (ICD10-I70.0) and Emphysema (ICD10-J43.9).   08/26/2023 [facility] Admission   hospitalization due to was observed speech and jerking movements on right side despite being on dexamethasone . Continuous EEG suggest cortical dysfunction arising from left temporal region.  No seizure or elective form discharges were seen. Patient was discharged on dexamethasone  4 mg, Keppra , lamotrigine .    11/04/2023 -  Chemotherapy   Add Bevacizumab  5mg /kg to Durvalumab .     11/17/2023 - 11/27/2023 Radiation Therapy   Patient received radiation to brain for treatment of recurrence.    12/17/2023 - 12/22/2023 patient was recently hospitalized due to right lower extremity cellulitis.  Patient was treated with vancomycin , cefepime .Noted some improvement.  Patient reports that he was discharged on oral doxycycline  and Augmentin  to complete course.  Patient noticed a worsening of symptoms soon after finishing antibiotics. 12/25/2023 ER visit, was prescribed a course of Ceftin  and he has finishes the course.  He has experienced decreased appetite, loss weight since started on Ceftin .  Right ankle and foot redness have improved. Still has pain of right big toe.   chronic SOB,not worse.   No fever or chills.    Review of Systems  Constitutional:  Positive for fatigue. Negative for appetite change, chills, fever and unexpected weight change.  HENT:   Negative for hearing loss and voice change.   Eyes:  Negative for eye problems and icterus.  Respiratory:  Negative for chest tightness, cough and shortness of breath.   Cardiovascular:  Positive for leg swelling. Negative for chest pain.  Gastrointestinal:  Negative for abdominal distention and abdominal pain.  Endocrine: Negative for hot flashes.  Genitourinary:  Negative for difficulty urinating, dysuria and frequency.   Musculoskeletal:  Positive for gait problem. Negative for arthralgias.       Right big  toe redness.   Skin:  Negative for itching and rash.  Neurological:  Positive for gait problem. Negative for extremity weakness, headaches, numbness and speech difficulty.  Hematological:  Negative for adenopathy. Bruises/bleeds easily.  Psychiatric/Behavioral:  Negative for confusion.       MEDICAL HISTORY:  Past Medical History:  Diagnosis Date   Allergy    Cancer (HCC)    lung   COPD (chronic obstructive pulmonary disease) (HCC)    Dyspnea    former smoker. 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   Malignant neoplasm metastatic to brain [facility])    Peripheral vascular disease (HCC)    Pre-diabetes    Sleep apnea     SURGICAL HISTORY: Past Surgical History:  Procedure Laterality Date   ANTERIOR CERVICAL DECOMP/DISCECTOMY FUSION N/A 03/14/2020   Procedure: ANTERIOR CERVICAL DECOMPRESSION/DISCECTOMY FUSION 3 LEVELS C4-7;  Surgeon: Jodeen Munch, MD;  Location: [facility] ORS;  Service: Neurosurgery;  Laterality: N/A;   BACK SURGERY  2011   CENTRAL LINE INSERTION Right 09/10/2016   Procedure: CENTRAL LINE INSERTION;  Surgeon: Jackquelyn Mass, MD;  Location: [facility] ORS;  Service: Vascular;  Laterality: Right;   CERVICAL FUSION     C4-c7 on Mar 14, 2020   COLONOSCOPY  2013 ?   COLONOSCOPY N/A 09/09/2021   Procedure: COLONOSCOPY;  Surgeon: Quintin Buckle, DO;  Location: [facility] ENDOSCOPY;  Service: Gastroenterology;  Laterality: N/A;   CORONARY ANGIOPLASTY     ESOPHAGOGASTRODUODENOSCOPY N/A 09/09/2021   Procedure: ESOPHAGOGASTRODUODENOSCOPY (EGD);  Surgeon: Quintin Buckle, DO;  Location: [facility] ENDOSCOPY;  Service: Gastroenterology;  Laterality: N/A;   FEMORAL-POPLITEAL BYPASS GRAFT Right 09/10/2016   Procedure: BYPASS GRAFT FEMORAL-POPLITEAL ARTERY ( BELOW KNEE );  Surgeon: Jackquelyn Mass, MD;  Location: [facility] ORS;  Service: Vascular;  Laterality: Right;   FEMORAL-TIBIAL BYPASS GRAFT Right 03/23/2020   Procedure:  BYPASS GRAFT RIGHT FEMORAL- DISTAL TIBIAL ARTERY WITH DISTAL FLOW GRAFT;  Surgeon: Jackquelyn Mass, MD;  Location: [facility] ORS;  Service: Vascular;  Laterality: Right;   LEFT HEART CATH AND CORONARY ANGIOGRAPHY Left 05/16/2021   Procedure: LEFT HEART CATH AND CORONARY ANGIOGRAPHY;  Surgeon: Link Rice, MD;  Location: [facility] INVASIVE CV LAB;  Service: Cardiovascular;  Laterality: Left;   LOWER EXTREMITY ANGIOGRAPHY Right 11/18/2016   Procedure: Lower Extremity Angiography;  Surgeon: Jackquelyn Mass, MD;  Location: [facility] INVASIVE CV LAB;  Service: Cardiovascular;  Laterality: Right;   LOWER EXTREMITY ANGIOGRAPHY Right 12/09/2016   Procedure: Lower Extremity Angiography;  Surgeon: Jackquelyn Mass, MD;  Location: [facility] INVASIVE CV LAB;  Service: Cardiovascular;  Laterality: Right;   LOWER EXTREMITY ANGIOGRAPHY Right 11/15/2019   Procedure: LOWER EXTREMITY ANGIOGRAPHY;  Surgeon: Jackquelyn Mass, MD;  Location: [facility] INVASIVE CV LAB;  Service: Cardiovascular;  Laterality: Right;   LOWER EXTREMITY ANGIOGRAPHY Right 12/01/2019   Procedure: Lower Extremity Angiography;  Surgeon: Celso College, MD;  Location: [facility] INVASIVE CV LAB;  Service: Cardiovascular;  Laterality: Right;   LOWER EXTREMITY ANGIOGRAPHY Right 12/02/2019   Procedure: LOWER EXTREMITY ANGIOGRAPHY;  Surgeon: Jackquelyn Mass, MD;  Location: [facility] INVASIVE CV LAB;  Service: Cardiovascular;  Laterality: Right;   LOWER EXTREMITY ANGIOGRAPHY Right 03/23/2020   Procedure: Lower Extremity Angiography;  Surgeon: Jackquelyn Mass, MD;  Location: [facility] INVASIVE CV LAB;  Service: Cardiovascular;  Laterality: Right;   LOWER EXTREMITY ANGIOGRAPHY Right 09/25/2021   Procedure: Lower Extremity Angiography;  Surgeon: Jackquelyn Mass, MD;  Location: [facility] INVASIVE CV LAB;  Service: Cardiovascular;  Laterality: Right;   LOWER EXTREMITY ANGIOGRAPHY Right 09/26/2021   Procedure: Lower Extremity Angiography;  Surgeon: Celso College, MD;  Location: [facility] INVASIVE  CV LAB;  Service: Cardiovascular;  Laterality: Right;   LOWER EXTREMITY ANGIOGRAPHY Right 04/08/2022   Procedure: Lower Extremity Angiography;  Surgeon: Jackquelyn Mass, MD;  Location: [facility] INVASIVE CV LAB;  Service: Cardiovascular;  Laterality: Right;   LOWER EXTREMITY INTERVENTION  11/18/2016   Procedure: Lower Extremity Intervention;  Surgeon: Jackquelyn Mass, MD;  Location: [facility] INVASIVE CV LAB;  Service: Cardiovascular;;   PERIPHERAL VASCULAR CATHETERIZATION Right 01/02/2015   Procedure: Lower Extremity Angiography;  Surgeon: Jackquelyn Mass, MD;  Location: [facility] INVASIVE CV LAB;  Service: Cardiovascular;  Laterality: Right;   PERIPHERAL VASCULAR CATHETERIZATION Right 06/18/2015   Procedure: Lower Extremity Angiography;  Surgeon: Celso College, MD;  Location: [facility] INVASIVE CV LAB;  Service: Cardiovascular;  Laterality: Right;   PERIPHERAL VASCULAR CATHETERIZATION  06/18/2015   Procedure: Lower Extremity Intervention;  Surgeon: Celso College, MD;  Location: [facility] INVASIVE CV LAB;  Service: Cardiovascular;;   PERIPHERAL VASCULAR CATHETERIZATION N/A 10/09/2015   Procedure: Abdominal Aortogram w/Lower Extremity;  Surgeon: Jackquelyn Mass, MD;  Location: [facility] INVASIVE CV LAB;  Service: Cardiovascular;  Laterality: N/A;   PERIPHERAL VASCULAR CATHETERIZATION  10/09/2015   Procedure: Lower Extremity Intervention;  Surgeon: Jackquelyn Mass, MD;  Location: [facility] INVAS[facility] CV LAB;  Service: Cardiovascular;;   PERIPHERAL VASCULAR CATHETERIZATION Right 10/10/2015   Procedure: Lower Extremity Angiography;  Surgeon: Celso College, MD;  Location: [facility] INV[facility] CV LAB;  Service: Cardiovascular;  Laterality: Right;   PERIPHERAL VASCULAR CATHETERIZATION Right 10/10/2015   Procedure: Lower Extremity Intervention;  Surgeon: Celso College, MD;  Location: [facility] INV[facility] CV LAB;  Service: Cardiovascular;  Laterality: Right;   PERIPHERAL VASCULAR CATHETERIZATION Right 12/03/2015   Procedure: Lower Extremity Angiography;   Surgeon: Celso College, MD;  Location: [facility] INVAS[facility] CV LAB;  Service: Cardiovascular;  Laterality: Right;   PERIPHERAL VASCULAR CATHETERIZATION Right 12/04/2015   Procedure: Lower Extremity Angiography;  Surgeon: Celso College, MD;  Location: [facility] INV[facility] LAB;  Service: Cardiovascular;  Laterality: Right;   PERIPHERAL VASCULAR CATHETERIZATION  12/04/2015   Procedure: Lower Extremity Intervention;  Surgeon: Celso College, MD;  Location: [facility] INV[facility] CV LAB;  Service: Cardiovascular;;   PERIPHERAL VASCULAR CATHETERIZATION Right 06/30/2016   Procedure: Lower Extremity Angiography;  Surgeon: Celso College, MD;  Location: [facility] INV[facility] CV LAB;  Service: Cardiovascular;  Laterality: Right;   PERIPHERAL VASCULAR CATHETERIZATION Right 06/25/2016   Procedure: Lower Extremity Angiography;  Surgeon: Jackquelyn Mass, MD;  Location: [facility] INV[facility];  Service: Cardiovascular;  Laterality: Right;   PERIPHERAL VASCULAR CATHETERIZATION Right 07/01/2016   Procedure: Lower Extremity Angiography;  Surgeon: Jackquelyn Mass, MD;  Location: [facility] IN[facility];  Service: Cardiovascular;  Laterality: Right;   PERIPHERAL VASCULAR CATHETERIZATION  07/01/2016   Procedure: Lower Extremity Intervention;  Surgeon: Jackquelyn Mass, MD;  Location: [facility] INV[facility] CV LAB;  Service: Cardiovascular;;   PERIPHERAL VASCULAR CATHETERIZATION Right 08/01/2016   Procedure: Lower Extremity Angiography;  Surgeon: Jackquelyn Mass, MD;  Location: [facility] INV[facility] CV LAB;  Service: Cardiovascular;  Laterality: Right;   PORTA CATH INSERTION N/A 10/05/2020   Procedure: PORTA CATH INSERTION;  Surgeon: Jackquelyn Mass, MD;  Location: [facility] INV[facility] CV LAB;  Service: Cardiovascular;  Laterality: N/A;   stent placement in right leg Right    VIDEO BRONCHOSCOPY N/A 09/14/2020   Procedure: VIDEO BRONCHOSCOPY WITH FLUORO;  Surgeon: Marc Senior, MD;  Location: [facility] ORS;  Service: Cardiopulmonary;  Laterality: N/A;   VIDEO BRONCHOSCOPY WITH  ENDOBRONCHIAL ULTRASOUND N/A 09/14/2020   Procedure: VIDEO BRONCHOSCOPY WITH ENDOBRONCHIAL ULTRASOUND;  Surgeon: Marc Senior, MD;  Location: [facility] OR[facility];  Service: Cardiopulmonary;  Laterality: N/A;    SOCIAL HISTORY: Social History   Socioeconomic History   Marital status: Single    Spouse name: Not on file   Number of children: Not on file   Years of education: Not on file   Highest education level: Not on file  Occupational History   Occupation: unemployed  Tobacco Use   Smoking status: Former    Current packs/day: 0.00    Average packs/day: 2.0 packs/day for 38.0 years (76.0 ttl pk-yrs)    Types: Cigarettes    Start date: 12/19/1976    Quit date: 12/20/2014    Years since quitting: 9.0   Smokeless tobacco: Never   Tobacco comments:    quit smoking in 2017 most smoked 2   Vaping Use   Vaping status: Former  Substance and Sexual Activity   Alcohol  use: Not Currently   Drug use: No   Sexual activity: Yes  Other Topics Concern   Not on file  Social History Narrative   Not on file   Social Drivers of Health   Financial Resource Strain:  Low Risk  (07/27/2023)   [facility]   Overall Financial Resource Strain (CARDIA)    Difficulty of Paying Living Expenses: Not hard at all  Food Insecurity: No Food Insecurity (12/18/2023)   Hunger Vital Sign    Worried About Running Out of Food in the Last Year: Never true    Ran Out of Food in the Last Year: Never true  Transportation Needs: No Transportation Needs (12/18/2023)   PRAPARE - Administrator, Civil Service (Medical): No    Lack of Transportation (Non-Medical): No  Physical Activity: Insufficiently Active (05/29/2023)   Exercise Vital Sign    Days of Exercise per Week: 2 days    Minutes of Exercise per Session: 10 min  Stress: No Stress Concern Present (05/29/2023)   [employer] of Occupational Health - Occupational Stress Questionnaire    Feeling of Stress : Not at all   Social Connections: Socially Isolated (12/18/2023)   Social Connection and Isolation Panel    Frequency of Communication with Friends and Family: More than three times a week    Frequency of Social Gatherings with Friends and Family: More than three times a week    Attends Religious Services: Never    Database administrator or Organizations: No    Attends Banker Meetings: Never    Marital Status: Never married  Intimate Partner Violence: Not At Risk (12/18/2023)   Humiliation, Afraid, Rape, and Kick questionnaire    Fear of Current or Ex-Partner: No    Emotionally Abused: No    Physically Abused: No    Sexually Abused: No    FAMILY HISTORY: Family History  Problem Relation Age of Onset   Diabetes Mother    Hypertension Mother    Heart murmur Mother    Leukemia Mother    Throat cancer Maternal Grandmother    Colon cancer Neg Hx    Breast cancer Neg Hx     ALLERGIES:  has no known allergies.  MEDICATIONS:  Current Outpatient Medications  Medication Sig Dispense Refill   amoxicillin -clavulanate (AUGMENTIN ) 875-125 MG tablet Take 1 tablet by mouth 2 (two) times daily. 14 tablet 0   potassium chloride  SA (KLOR-CON  M) 20 MEQ tablet Take 1 tablet (20 mEq total) by mouth daily. 30 tablet 0   aspirin  EC 81 MG tablet Take 81 mg by mouth daily. Swallow whole.     clopidogrel  (PLAVIX ) 75 MG tablet TAKE 1 TABLET BY MOUTH DAILY 100 tablet 2   levETIRAcetam  (KEPPRA ) 1000 MG tablet Take 1 tablet (1,000 mg total) by mouth 2 (two) times daily. 60 tablet 2   levothyroxine  (SYNTHROID ) 125 MCG tablet Take 1 tablet (125 mcg total) by mouth daily before breakfast. 90 tablet 0   rosuvastatin  (CRESTOR ) 40 MG tablet Take 1 tablet (40 mg total) by mouth daily. 90 tablet 3   No current facility-administered medications for this visit.   Facility-Administered Medications Ordered in Other Visits  Medication Dose Route Frequency Provider Last Rate Last Admin   0.9 %  sodium chloride   infusion   Intravenous Continuous [name], MD 40 mL/hr at 01/08/24 0953 New Bag at 01/08/24 0953   albuterol  (PROVENTIL ) (2.5 MG/3ML) 0.083% nebulizer solution 2.5 mg  2.5 mg Nebulization Once Marc Senior, MD       heparin  lock flush 100 unit/mL  500 Units Intracatheter Once PRN [name], MD       potassium chloride  20 mEq in 100 mL IVPB  20 mEq Intravenous Once [name], MD 50 mL/hr at 01/08/24 0954 20 mEq at 01/08/24 0954     PHYSICAL EXAMINATION: ECOG PERFORMANCE STATUS: 1 - Symptomatic but completely ambulatory Vitals:   01/08/24 0833  BP: 129/83  Pulse: 95  Resp: 18  Temp: (!) 97.1 F (36.2 C)  SpO2: 97%    Filed Weights   01/08/24 0833  Weight: 200 lb 4.8 oz (90.9 kg)     Physical Exam Constitutional:      General: He is not in acute distress.    Appearance: He is obese.  HENT:     Head: Normocephalic and atraumatic.   Eyes:     General: No scleral icterus.   Cardiovascular:     Rate and Rhythm: Normal rate and regular rhythm.  Pulmonary:     Effort: Pulmonary effort is normal. No respiratory distress.     Breath sounds: Normal breath sounds. No wheezing.  Abdominal:     General: There is no distension.     Palpations: Abdomen is soft.     Tenderness: There is no abdominal tenderness.   Musculoskeletal:     Cervical back: Normal range of motion and neck supple.     Comments: Right ankle and foot redness swelling improved.  Right big toe still has redness/swelling, tenderness   Skin:    General: Skin is warm and dry.     Findings: Bruising present. No erythema.   Neurological:     Mental Status: He is alert and oriented to person, place, and time. Mental status is at baseline.     Cranial Nerves: No cranial nerve deficit.     Motor: No abnormal muscle tone.   Psychiatric:        Mood and Affect: Mood and affect normal.         LABORATORY DATA:  I have reviewed the data as listed    Latest Ref Rng & Units 01/08/2024    8:04 AM  12/25/2023   12:36 PM 12/25/2023   10:56 AM  CBC  WBC 4.0 - 10.5 K/uL 5.9  7.8  7.0   Hemoglobin 13.0 - 17.0 g/dL 45.4  09.8  11.9   Hematocrit 39.0 - 52.0 % 42.0  40.7  38.4   Platelets 150 - 400 K/uL 249  336  316       Latest Ref Rng & Units 01/08/2024    8:04 AM 12/25/2023   12:36 PM 12/25/2023   10:56 AM  CMP  Glucose 70 - 99 mg/dL 147  829  562   BUN 8 - 23 mg/dL 7  9  9    Creatinine 0.61 - 1.24 mg/dL 1.30  8.65  7.84   Sodium 135 - 145 mmol/L 136  139  136   Potassium 3.5 - 5.1 mmol/L 2.8  3.4  2.9   Chloride 98 - 111 mmol/L 99  105  104   CO2 22 - 32 mmol/L 26  26  23    Calcium  8.9 - 10.3 mg/dL 8.9  8.8  8.1   Total Protein 6.5 - 8.1 g/dL 7.2  6.9  6.6   Total Bilirubin 0.0 - 1.2 mg/dL 0.8  0.9  0.7   Alkaline Phos 38 - 126 U/L 90  78  86   AST 15 - 41 U/L 53  32  32   ALT 0 - 44 U/L 32  17  17      RADIOGRAPHIC STUDIES: I have personally reviewed the radiological images as  listed and agreed with the findings in the report. 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 EXAM: MRI HEAD WITHOUT AND WITH CONTRAST TECHNIQUE: Multiplanar, multiecho pulse sequences of the brain and surrounding structures were obtained without and with intravenous contrast. CONTRAST:  9mL GADAVIST  GADOBUTROL  1 MMOL/ML IV SOLN COMPARISON:  MRI of the brain dated November 06, 2023. FINDINGS: Brain: A centrally necrotic, peripherally enhancing lesion within the left parietal lobe has decreased in size in the interim from approximately 3.5 x 2.9 x 2.4 cm to approximately 2.7 x 2.0 x 1.8 cm. A lesion within the left frontal operculum as marginally decreased in size in the interim from 1.4 x 1.4 x 1.6 cm to approximately 1.2 x 1.2 x 1.3 cm. A and nodule previously noted within the right occipital lobe has also marginally in decrease in size in the interim from 8 mm in diameter to 7 mm. The linear area of enhancement previously  noted along the left inferior parietal lobe appears represent a vessel. There are no new or enlarging lesions present. There is moderate periventricular and deep cerebral white matter disease. There is gliosis also noted adjacent to the necrotic lesion within the left parietal lobe. Vascular: Normal vascular flow voids. Skull and upper cervical spine: No osseous lesions are evident. Normal bone marrow signal. Sinuses/Orbits: Mild mucosal disease within the right maxillary sinus. The orbits are unremarkable. Other: None. IMPRESSION: 1. Interval improvement of metastatic disease within the left parietal, left frontal and right occipital lobes. Electronically Signed   By: Maribeth Shivers M.D.   On: 01/04/2024 13:17   DG Foot 2 Views Right Result Date: 12/25/2023 CLINICAL DATA:  Foot wound EXAM: RIGHT FOOT - 2 VIEW COMPARISON:  None Available. FINDINGS: There is no evidence of fracture or dislocation. There is no evidence of arthropathy or other focal bone abnormality. Soft tissues soft tissue swelling of the dorsum IMPRESSION: Soft tissue swelling of the dorsum of the foot. Electronically Signed   By: Fredrich Jefferson M.D.   On: 12/25/2023 13:05   DG Foot 2 Views Right Result Date: 12/17/2023 CLINICAL DATA:  Cellulitis. Right foot pain for 1 month. Wound to big toe. Redness and swelling. EXAM: RIGHT FOOT - 2 VIEW COMPARISON:  None Available. FINDINGS: Generalized soft tissue edema. Site of wound is not well delineated by radiograph. No erosive or bony destructive change. No fracture. Mild degenerative change of the first metatarsal phalangeal joint. No soft tissue gas or radiopaque foreign body. Small plantar calcaneal spur. IMPRESSION: 1. Generalized soft tissue edema. No radiographic evidence of osteomyelitis. 2. Mild degenerative change of the first metatarsophalangeal joint. Electronically Signed   By: Chadwick Colonel M.D.   On: 12/17/2023 23:29   DG Chest 2 View Result Date: 12/17/2023 CLINICAL DATA:   Suspected sepsis EXAM: CHEST - 2 VIEW COMPARISON:  02/10/2022 FINDINGS: Right Port-A-Cath remains in place, unchanged. Heart mediastinal contours within normal limits. No acute confluent airspace opacities or effusions. Post treatment scarring noted in the right suprahilar region, unchanged. No acute bony abnormality. IMPRESSION: No active cardiopulmonary disease. Electronically Signed   By: Janeece Mechanic M.D.   On: 12/17/2023 21:05   US  Venous Img Lower Unilateral Right (DVT) Result Date: 11/18/2023 CLINICAL DATA:  Right leg swelling EXAM: RIGHT LOWER EXTREMITY VENOUS DOPPLER ULTRASOUND TECHNIQUE: Gray-scale sonography with compression, as well as color and duplex ultrasound, were performed to evaluate the deep venous system(s) from the level  of the common femoral vein through the popliteal and proximal calf veins. COMPARISON:  None Available. FINDINGS: VENOUS Normal compressibility of the common femoral, superficial femoral, and popliteal veins, as well as the visualized calf veins. Visualized portions of profunda femoral vein and great saphenous vein unremarkable. No filling defects to suggest DVT on grayscale or color Doppler imaging. Doppler waveforms show normal direction of venous flow, normal respiratory plasticity and response to augmentation. Limited views of the contralateral common femoral vein are unremarkable. 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 Limitations: none IMPRESSION: 1. No findings of right lower extremity DVT. 2. 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 If workup of the right lower extremity arterial system is indicated, a CT angiogram runoff study could be performed. Electronically Signed   By: Freida Jes M.D.   On: 11/18/2023 17:25   MR Brain W Wo Contrast Result Date: 11/09/2023 CLINICAL DATA:  Brain metastasis, assess treatment response.  History of squamous cell carcinoma of the right lung diagnosed in 2022. Status post chemoradiation. Enhancing lesions noted on brain MRI in 2023. Status post SRS. EXAM: MRI HEAD WITHOUT AND WITH CONTRAST TECHNIQUE: Multiplanar, multiecho pulse sequences of the brain and surrounding structures were obtained without and with intravenous contrast. CONTRAST:  10mL GADAVIST  GADOBUTROL  1 MMOL/ML IV SOLN COMPARISON:  None Available. FINDINGS: Brain: 1.4 x 1.4 x 1.6 cm enhancing mass in the posterior aspect of the left frontal operculum previously measuring 1.8 x 1.6 x 2.2 cm when remeasured in a similar manner (series 1200 image 197, series 9 image 26). Additional centrally necrotic enhancing lesion in the left parietal lobe which measures 3.5 x 2.9 x 2.4 cm, previously 4.5 x 3.1 x 2.9 cm (series 1200, image 241). There is decreased enhancement along the inferior margin of the lesion. Centrally necrotic component appears slightly decreased in size but appears to occupy a greater portion of the lesion compared to prior. Similar enhancement along the superficial/superior margin of the lesion with similar extension to the dura. Redemonstration of extensive vasogenic edema in the left frontoparietal lobes which is overall decreased compared to the prior study with decreased gyral expansion. There is likely slightly decreased involvement of the left basal ganglia and external capsule. Approximately 4 mm rightward midline shift, previously 7 mm. 0.8 cm peripherally enhancing lesion in the right occipital lobe, previously measuring 1.0 cm. Surrounding vasogenic edema is similar to prior. There is a new linear focus of enhancement over the convexity of the left inferior parietal lobule which may reflect a non suppressed vessel (series 1200 image 210). No nodular or masslike enhancement in this region. Otherwise no new enhancing lesions noted. No acute infarct. Similar chronic microhemorrhage in the mid/posterior left cingulate  gyrus. Similar chronic blood products associated with the left cerebral lesions. The basilar cisterns are patent. No extra-axial fluid collection. Vascular: Normal flow voids. Skull and upper cervical spine: Normal marrow signal. Sinuses/Orbits: Focal mucosal thickening in the right posterior ethmoid air cells. Other: None. IMPRESSION: Compared to 10/09/2023 there is decreased size of the left frontal operculum lesion and left parietal lesion. Decreased vasogenic edema in the left frontoparietal lobes with decreased gyral expansion. 4 mm rightward midline shift, previously 7 mm. Similar appearance of right occipital lesion. Focal curvilinear enhancement over the convexity of the inferior left parietal lobe which may reflect a non suppressed vessel. Recommend attention on follow-up. Electronically Signed  By: Denny Flack M.D.   On: 11/09/2023 20:28   NM PET Metabolic Brain Result Date: 10/20/2023 CLINICAL DATA:  Dementia. 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 Full-ring PET imaging was performed from the vertex to skull base. CT data was obtained and used for attenuation correction and anatomic localization. Post processing fusion of PET data set with brain MRI 10/09/2023. FASTING BLOOD GLUCOSE:  Value: 88 mg/dl COMPARISON:  Brain MRI 10/09/2018 FINDINGS: Focus of hypermetabolic activity in the posterior lateral LEFT frontal lobe which is slightly greater than cortical metabolism (image 58). This corresponds to enhancing lesion on comparison MRI. On coronal orientation the most intense portion the lesion along the craniad surface of the lesion. Thin rim of peripheral hypermetabolic activity associated with the high LEFT parietal enhancing lesion (image 24 through 31 PET data set). This rim of metabolic activity is slightly greater than adjacent normal cortical metabolic activity. IMPRESSION: Findings  concerning for tumor recurrence within the LEFT posterior lateral frontal lobe and high LEFT parietal lobe. Electronically Signed   By: Deboraha Fallow M.D.   On: 10/20/2023 12:30   CT CHEST ABDOMEN PELVIS W CONTRAST Result Date: 10/18/2023 CLINICAL DATA:  Lung cancer restaging * Tracking Code: BO * EXAM: CT CHEST, ABDOMEN, AND PELVIS WITH CONTRAST TECHNIQUE: Multidetector CT imaging of the chest, abdomen and pelvis was performed following the standard protocol during bolus administration of intravenous contrast. RADIATION DOSE REDUCTION: This exam was performed according to the departmental dose-optimization program which includes automated exposure control, adjustment of the mA and/or kV according to patient size and/or use of iterative reconstruction technique. CONTRAST:  OMNIPAQUE  IOHEXOL  300 MG/ML  SOLN COMPARISON:  06/25/2023 FINDINGS: CT CHEST FINDINGS Cardiovascular: Right chest port catheter. Scattered aortic atherosclerosis. Normal heart size. Scattered left and right coronary artery calcifications. No pericardial effusion. Mediastinum/Nodes: No enlarged mediastinal, hilar, or axillary lymph nodes. Thyroid  gland, trachea, and esophagus demonstrate no significant findings. Lungs/Pleura: Unchanged post treatment/post radiation appearance of the right lung with perihilar and paramedian fibrosis and consolidation. Minimal paraseptal emphysema. No pleural effusion or pneumothorax. Musculoskeletal: No chest wall abnormality. No acute osseous findings. CT ABDOMEN PELVIS FINDINGS Hepatobiliary: No solid liver abnormality is seen. Hepatic steatosis. No gallstones, gallbladder wall thickening, or biliary dilatation. Pancreas: Unremarkable. No pancreatic ductal dilatation or surrounding inflammatory changes. Spleen: Normal in size without significant abnormality. Adrenals/Urinary Tract: Adrenal glands are unremarkable. Kidneys are normal, without renal calculi, solid lesion, or hydronephrosis. Bladder is  unremarkable. Stomach/Bowel: Stomach is within normal limits. Appendix appears normal. No evidence of bowel wall thickening, distention, or inflammatory changes. Sigmoid diverticulosis. Vascular/Lymphatic: Aortic atherosclerosis. Partially imaged right superficial femoral and profunda system stenting. No enlarged abdominal or pelvic lymph nodes. Reproductive: No mass or other abnormality. Other: No abdominal wall hernia or abnormality. No ascites. Musculoskeletal: No acute osseous findings. IMPRESSION: 1. Unchanged post treatment/post radiation appearance of the right lung with perihilar and paramedian fibrosis and consolidation. 2. No evidence of lymphadenopathy or metastatic disease in the chest, abdomen, or pelvis. 3. Hepatic steatosis. 4. Coronary artery disease. Aortic Atherosclerosis (ICD10-I70.0) and Emphysema (ICD10-J43.9). Electronically Signed   By: Fredricka Jenny M.D.   On: 10/18/2023 08:12

## 2024-01-09 ENCOUNTER — Other Ambulatory Visit: Payer: Self-pay

## 2024-01-11 ENCOUNTER — Other Ambulatory Visit: Payer: Self-pay | Admitting: *Deleted

## 2024-01-11 ENCOUNTER — Encounter

## 2024-01-11 DIAGNOSIS — C7931 Secondary malignant neoplasm of brain: Secondary | ICD-10-CM

## 2024-01-19 ENCOUNTER — Ambulatory Visit: Admitting: Podiatry

## 2024-01-20 ENCOUNTER — Encounter: Payer: Self-pay | Admitting: Oncology

## 2024-01-25 ENCOUNTER — Other Ambulatory Visit: Payer: Self-pay

## 2024-01-25 ENCOUNTER — Inpatient Hospital Stay: Attending: Oncology

## 2024-01-25 ENCOUNTER — Inpatient Hospital Stay (HOSPITAL_BASED_OUTPATIENT_CLINIC_OR_DEPARTMENT_OTHER): Admitting: Oncology

## 2024-01-25 ENCOUNTER — Ambulatory Visit

## 2024-01-25 ENCOUNTER — Encounter: Payer: Self-pay | Admitting: Oncology

## 2024-01-25 ENCOUNTER — Inpatient Hospital Stay

## 2024-01-25 ENCOUNTER — Encounter: Attending: Physician Assistant | Admitting: Physician Assistant

## 2024-01-25 VITALS — BP 121/84 | HR 108 | Temp 98.0°F | Resp 18 | Wt 180.1 lb

## 2024-01-25 DIAGNOSIS — L03115 Cellulitis of right lower limb: Secondary | ICD-10-CM | POA: Diagnosis not present

## 2024-01-25 DIAGNOSIS — R112 Nausea with vomiting, unspecified: Secondary | ICD-10-CM | POA: Insufficient documentation

## 2024-01-25 DIAGNOSIS — E876 Hypokalemia: Secondary | ICD-10-CM | POA: Diagnosis not present

## 2024-01-25 DIAGNOSIS — I739 Peripheral vascular disease, unspecified: Secondary | ICD-10-CM | POA: Diagnosis not present

## 2024-01-25 DIAGNOSIS — L97512 Non-pressure chronic ulcer of other part of right foot with fat layer exposed: Secondary | ICD-10-CM | POA: Diagnosis present

## 2024-01-25 DIAGNOSIS — C3411 Malignant neoplasm of upper lobe, right bronchus or lung: Secondary | ICD-10-CM | POA: Insufficient documentation

## 2024-01-25 DIAGNOSIS — R569 Unspecified convulsions: Secondary | ICD-10-CM

## 2024-01-25 DIAGNOSIS — E032 Hypothyroidism due to medicaments and other exogenous substances: Secondary | ICD-10-CM | POA: Diagnosis not present

## 2024-01-25 DIAGNOSIS — C3480 Malignant neoplasm of overlapping sites of unspecified bronchus and lung: Secondary | ICD-10-CM | POA: Insufficient documentation

## 2024-01-25 DIAGNOSIS — C7931 Secondary malignant neoplasm of brain: Secondary | ICD-10-CM | POA: Diagnosis not present

## 2024-01-25 DIAGNOSIS — L97112 Non-pressure chronic ulcer of right thigh with fat layer exposed: Secondary | ICD-10-CM | POA: Diagnosis not present

## 2024-01-25 DIAGNOSIS — N179 Acute kidney failure, unspecified: Secondary | ICD-10-CM | POA: Insufficient documentation

## 2024-01-25 DIAGNOSIS — E039 Hypothyroidism, unspecified: Secondary | ICD-10-CM | POA: Insufficient documentation

## 2024-01-25 DIAGNOSIS — L97812 Non-pressure chronic ulcer of other part of right lower leg with fat layer exposed: Secondary | ICD-10-CM | POA: Insufficient documentation

## 2024-01-25 DIAGNOSIS — C3491 Malignant neoplasm of unspecified part of right bronchus or lung: Secondary | ICD-10-CM | POA: Diagnosis not present

## 2024-01-25 DIAGNOSIS — Z5112 Encounter for antineoplastic immunotherapy: Secondary | ICD-10-CM | POA: Insufficient documentation

## 2024-01-25 DIAGNOSIS — I7389 Other specified peripheral vascular diseases: Secondary | ICD-10-CM | POA: Diagnosis not present

## 2024-01-25 LAB — CBC WITH DIFFERENTIAL/PLATELET
Abs Immature Granulocytes: 0.02 K/uL (ref 0.00–0.07)
Basophils Absolute: 0.1 K/uL (ref 0.0–0.1)
Basophils Relative: 1 %
Eosinophils Absolute: 0.2 K/uL (ref 0.0–0.5)
Eosinophils Relative: 3 %
HCT: 42.4 % (ref 39.0–52.0)
Hemoglobin: 13.4 g/dL (ref 13.0–17.0)
Immature Granulocytes: 0 %
Lymphocytes Relative: 13 %
Lymphs Abs: 1 K/uL (ref 0.7–4.0)
MCH: 27.5 pg (ref 26.0–34.0)
MCHC: 31.6 g/dL (ref 30.0–36.0)
MCV: 86.9 fL (ref 80.0–100.0)
Monocytes Absolute: 0.8 K/uL (ref 0.1–1.0)
Monocytes Relative: 11 %
Neutro Abs: 5.2 K/uL (ref 1.7–7.7)
Neutrophils Relative %: 72 %
Platelets: 294 K/uL (ref 150–400)
RBC: 4.88 MIL/uL (ref 4.22–5.81)
RDW: 14.9 % (ref 11.5–15.5)
WBC: 7.2 K/uL (ref 4.0–10.5)
nRBC: 0 % (ref 0.0–0.2)

## 2024-01-25 LAB — COMPREHENSIVE METABOLIC PANEL WITH GFR
ALT: 21 U/L (ref 0–44)
AST: 37 U/L (ref 15–41)
Albumin: 3.6 g/dL (ref 3.5–5.0)
Alkaline Phosphatase: 79 U/L (ref 38–126)
Anion gap: 18 — ABNORMAL HIGH (ref 5–15)
BUN: 13 mg/dL (ref 8–23)
CO2: 21 mmol/L — ABNORMAL LOW (ref 22–32)
Calcium: 9.2 mg/dL (ref 8.9–10.3)
Chloride: 97 mmol/L — ABNORMAL LOW (ref 98–111)
Creatinine, Ser: 1.43 mg/dL — ABNORMAL HIGH (ref 0.61–1.24)
GFR, Estimated: 54 mL/min — ABNORMAL LOW (ref >60)
Glucose, Bld: 94 mg/dL (ref 70–99)
Potassium: 3.1 mmol/L — ABNORMAL LOW (ref 3.5–5.1)
Sodium: 136 mmol/L (ref 135–145)
Total Bilirubin: 1.4 mg/dL — ABNORMAL HIGH (ref 0.0–1.2)
Total Protein: 7.4 g/dL (ref 6.5–8.1)

## 2024-01-25 LAB — TSH: TSH: 4.101 u[IU]/mL (ref 0.350–4.500)

## 2024-01-25 MED ORDER — MUPIROCIN 2 % EX OINT
1.0000 | TOPICAL_OINTMENT | Freq: Every day | CUTANEOUS | 5 refills | Status: DC
  Filled 2024-01-25: qty 22, 30d supply, fill #0
  Filled 2024-02-05: qty 22, 20d supply, fill #1
  Filled 2024-02-19: qty 22, 14d supply, fill #1
  Filled 2024-03-02: qty 22, 14d supply, fill #2
  Filled 2024-03-02 (×2): qty 22, 30d supply, fill #2
  Filled 2024-03-24: qty 22, 14d supply, fill #3

## 2024-01-25 MED ORDER — HEPARIN SOD (PORK) LOCK FLUSH 100 UNIT/ML IV SOLN
500.0000 [IU] | Freq: Once | INTRAVENOUS | Status: AC
  Administered 2024-01-25: 500 [IU] via INTRAVENOUS
  Filled 2024-01-25: qty 5

## 2024-01-25 MED ORDER — ONDANSETRON HCL 4 MG/2ML IJ SOLN
8.0000 mg | Freq: Once | INTRAMUSCULAR | Status: AC
  Administered 2024-01-25: 8 mg via INTRAVENOUS
  Filled 2024-01-25: qty 4

## 2024-01-25 MED ORDER — PROCHLORPERAZINE MALEATE 10 MG PO TABS
10.0000 mg | ORAL_TABLET | Freq: Four times a day (QID) | ORAL | 0 refills | Status: DC | PRN
  Filled 2024-01-25: qty 30, 8d supply, fill #0

## 2024-01-25 MED ORDER — ONDANSETRON HCL 8 MG PO TABS
8.0000 mg | ORAL_TABLET | Freq: Three times a day (TID) | ORAL | 0 refills | Status: DC | PRN
Start: 2024-01-25 — End: 2024-03-02
  Filled 2024-01-25: qty 20, 7d supply, fill #0

## 2024-01-25 MED ORDER — SODIUM CHLORIDE 0.9 % IV SOLN
Freq: Once | INTRAVENOUS | Status: AC
  Filled 2024-01-25: qty 250

## 2024-01-25 NOTE — Assessment & Plan Note (Signed)
 Follow up with Dr. Mark Sil.  Continue keppra  and course of  dexamethasone  as advised by Dr. Mark Sil.

## 2024-01-25 NOTE — Assessment & Plan Note (Signed)
 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 Dewey.  PET brain finding suggests recurrence -NeuroOnc recommend Radiation and Bevacizumab   Discussed with [name].   MRI brain showed good response.  Hold Bevacizumab  today,plan to resume inext week for 10mg /kg monthly x 3 months then stop.

## 2024-01-25 NOTE — Assessment & Plan Note (Signed)
 IVF 1L NS x 1 today IV zofran 

## 2024-01-25 NOTE — Progress Notes (Signed)
 Hematology/Oncology Progress note [phone] [phone]    CHIEF COMPLAINTS/REASON FOR VISIT:  Follow up for lung cancer  ASSESSMENT & PLAN:   Cancer Staging  Squamous cell carcinoma of right lung (HCC) Staging form: Lung, AJCC 8th Edition - Clinical stage from 09/20/2020: Stage IV (cT1b, cN2, cM1) - Signed by [name], MD on 12/13/2021   Squamous cell carcinoma of right lung (HCC) Durvalumab  was previously held due to pneumonitis.  Labs are reviewed and discussed with patient. Hold Durvalumab    Seizure (HCC) Follow up with [name].  Continue keppra  and course of  dexamethasone  as advised by [name].     Malignant neoplasm metastatic to brain [facility]) S/p SRS by Radonc Dr. Dewey.  PET brain finding suggests recurrence -NeuroOnc recommend Radiation and Bevacizumab   Discussed with [name].   MRI brain showed good response.  Hold Bevacizumab  today,plan to resume inext week for 10mg /kg monthly x 3 months then stop.   Hypothyroidism due to medication Normal TSH continue levothyroxine  125 mcg daily    AKI (acute kidney injury) (HCC) IVF 1L NS x 1 today IV zofran      Follow up 2 weeks lab MD Durvalumab  All questions were answered. The patient knows to call the clinic with any problems, questions or concerns.  Call Babara, MD, PhD [facility] Hematology Oncology 01/25/2024       HISTORY OF PRESENTING ILLNESS:  Joshua [name] is a 68 y.o. male who has above history reviewed by me today presents for follow up visit for Stage IV lung squamous cell carcinoma. Oncology History  Squamous cell carcinoma of right lung (HCC)  09/18/2020 Imaging   PET scan showed right upper lobe nodule maximal SUV 11.5.  Hypermetabolic cluster right paratracheal adenopathy with maximum SUV up to 12.  No findings of metastatic disease to the neck/abdomen/pelvis or skeleton.   09/20/2020 Initial Diagnosis   Squamous cell carcinoma of right lung (HCC) NGS showed  PD-L1 TPS 95%, LRP1B S997fs, PIK3CA E545K, RB1 c1422-2A>T, RIT1 T83_ A84del, STK11 P281fs, TPS53 R248W, TMB 19.35mut/mb, MS stable.    09/20/2020 Cancer Staging   Staging form: Lung, AJCC 8th Edition - Clinical stage from 09/20/2020: Stage IV (cT1b, cN2, cM1) - Signed by [name], MD on 12/13/2021   10/03/2020 Imaging   MRI brain with and without contrast showed no evidence of intracranial metastatic disease. Well-defined round lesion within the left parotid tail measuring approximately 2.3 x 1.8 x 1.8 cm   10/09/2020 - 11/27/2020 Radiation Therapy   concurrent chemoradiation   10/10/2020 - 10/31/2020 Chemotherapy   10/10/2020 concurrent chemoradiation.  carboplatin  AUC of 2 and Taxol  45 mg/m2 x 4.  Additional chemotherapy was held due to thrombocytopenia, neutropenia       12/31/2020 - 03/21/2022 Chemotherapy   Maintenance  Durvalumab  q14d      12/31/2020 -  Chemotherapy   Patient is on Treatment Plan : LUNG Durvalumab  (10) q14d     03/28/2021 Imaging   CT without contrast showed new linear right perihilar consolidation with associated traction bronchiectasis.  Likely postradiation changes.  Additional new peribronchial vascular/nodular groundglass opacities with areas of consolidation or seen primarily in the right upper and lower lobes, more distant from expected radiation fields. 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  Per my discussion with pulmonology Dr. 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     05/09/2021 Imaging   Repeat CT  chest without contrast showed evolving radiation changes in the right perihilar region likely accounting for interval increase to thickening of the posterior wall of the right upper lobe bronchus.  The more peripheral right lung groundglass opacities seen on most recent study have generally improved with exception of one peripherally  right upper lobe which appears new.  These are likely inflammatory.  No findings highly suspicious for local recurrence.   05/30/2021 - 01/10/2022 Chemotherapy   Durvalumab  q14d     10/07/2021 Imaging   MRI brain with and without contrast showed 2 contrast-enhancing lesions, most consistent with metastatic disease.  Largest lesion is in the left frontal lobe and measures up to 1.3 cm with a large amount of surrounding edema and 9 mm rightward midline shift.  The other enhancing lesion was 4 mm in the right occipital lobe with a small amount of surrounding edema.  No acute or chronic hemorrhage.   10/07/2021 Progression   Headache after colonoscopy, progressive difficulty using her right arm.  Progressively worsening of speech. Stage IV lung cancer with brain metastasis.  started on Dexamethasone .  He was seen by neurosurgery and radiation oncology.  Recommendation is to proceed with SRS.  Patient was discharged with dexamethasone    10/16/2021 Imaging   PET scan showed postradiation changes in the right supra hilar lung without evidence of local recurrence.  No evidence of lung cancer recurrence or metastatic disease.  Single focus of intense metabolic activity associated with posterior left 11th rib likely secondary to recent fall/posttraumatic metabolic changes.  Frontal  uptake in the left frontal lobe corresponding to the known metastatic lesion in the brain   10/29/2021 -  Radiation Therapy   Brain Radiation [facility]   12/10/2021 Imaging   Brain MRI: Continued interval decrease in size of a left frontal lobe metastasis, now measuring 10 mm. Moderate surrounding edema has also decreased.  3-4 mm metastasis within the right occipital lobe, not significantly changed in size. There is now a small focus of central non enhancement within this lesion. Increased edema. 3 mm enhancing metastasis within the high left parietal lobe. In retrospect, this was likely present as a subtle punctate focus of enhancement  on the prior brain MRI of 10/22/2021.   01/18/2022 Imaging   CT chest abdomen pelvis w contrast 1. Stable post treatment changes in the right upper lobe medially with dense radiation fibrosis but no findings to suggest local recurrent tumor. 2. Significant progression of ill-defined ground-glass opacity, interstitial thickening and airspace nodularity in the right upper lobe and upper aspect of the right lower lobe. Findings could reflect an inflammatory or atypical infectious process, drug induced pneumonitis or interstitial spread of tumor.3. No mediastinal or hilar mass or adenopathy. 4. No findings for abdominal/pelvic metastatic disease or osseous metastatic disease.   02/19/2022 Imaging   MRI brain w wo contrast showed Three metastatic deposits in the brain appear smaller. Decreased edema in the left parietal lobe. No new lesions    04/04/2022 Imaging   CT chest w contrast 1. Stable radiation changes in the right perihilar region without evidence of local recurrence. 2. Interval improvement in the patchy ground-glass opacities previously demonstrated in both lungs, most consistent with a resolving infectious or inflammatory process (including immunotherapy-related pneumonitis). 3. No evidence of metastatic disease.   05/14/2022 Miscellaneous   He had a mechanical fall and fractured his left elbow. xrays showed a left elbow fracture  05/26/22 seen by orthopedic surgeon, cast applied.    05/27/2022 Imaging   MRI  brain w wo contrast 1. All three known brain metastases have enlarged since August, with increasing regional edema. And there is a small new left anterior cerebellar 4-5 mm metastasis (was questionably punctate on the May MRI). This constellation could reflect a combination of True Progression (Left Cerebellar lesion) and pseudoprogression/radionecrosis (3 existing mets).    2. No significant intracranial mass effect. 3. Underlying moderate cerebral white matter signal changes.    05/30/2022 Imaging   CT chest abdomen pelvis w contrast  1. 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 There is a stable small right pleural effusion, but no definitive findings to suggest residual or recurrent disease on today's examination. 2. No signs of metastatic disease in the abdomen or pelvis.  3. Aortic atherosclerosis, in addition to two-vessel coronary artery disease. Please note that although the presence of coronary artery calcium  documents the presence of coronary artery disease, the severity of this disease and any potential stenosis cannot be assessed on this non-gated CT examination. Assessment for potential risk factor modification, dietary therapy or pharmacologic therapy may be warranted, if clinically indicated. 4. Hepatic steatosis. 5. Colonic diverticulosis without evidence of acute diverticulitis at this time. 6. Incompletely imaged but apparently occluded vascular graft in the superior aspect of the right anterior thigh. Referral to vascular specialist for further clinical evaluation should be considered if clinically appropriate. 7. Additional incidental findings, as above.   07/22/2022 Imaging   MRI brain  1. No new or enlarging lesions. No intracranial disease progression. 2. Decreased size of treated lesions of the left frontoparietal junction and left postcentral gyrus. 3. Unchanged lesions in the left cerebellum and right occipital lobe.   10/15/2022 Imaging   MRI brain w wo contrast showed Interval increase in size of multiple intraparenchymal lesions with worsened surrounding vasogenic edema. 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 No new lesions   11/05/2022 Imaging   MRI brain w wo contrast  1. Likely slight increase and right occipital and left cerebellar  lesions. Otherwise, unchanged metastases, as above. No new lesions identified. 2. Partially imaged large probable lymph node in the left upper neck, apparent on prior PET CT.   11/13/2022 -  Radiation Therapy   Each of the sites were treated with SRS/SBRT/SRT-IMRT technique to 20 Gy in 1 fraction to each of the following sites: PTV_2_LCerebel_54mm PTV_3_RCerebel_25mm PTV_4_ROccip_32mm PTV_5_LParietal_47mm   11/27/2022 Imaging   CT chest abdomen pelvis w contrast showed  Improving bilateral scattered interstitial and ground-glass opacities. Small amount of residual areas in the extreme left lower lobe inferiorly. This specific area is slightly increased from previous but overall the amount of opacities is significantly improved. Recommend continued follow-up. No developing new mass lesion, fluid collection or lymph node enlargement. Stable posttreatment changes right perihilar in the lung. Fatty liver infiltration. Colonic diverticulosis. Improved tiny right pleural effusion   02/05/2023 Imaging   MRI brain with and without contrast Interval decrease in all of the metastatic lesions, detailed above.No new lesions identified.    Imaging   CT chest abdomen pelvis w contrast showed  1. 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 2. Trace right pleural fluid or thickening, new or minimally increased. 3. Incidental findings, including: Aortic atherosclerosis (ICD10-I70.0), coronary artery atherosclerosis and emphysema (ICD10-J43.9). Hepatic steatosis.   06/25/2023 Imaging   CT chest abdomen pelvis w contrast showed 1. Unchanged  post treatment/post radiation appearance of the right lung, with perihilar and suprahilar paramedian fibrosis and consolidation. 2. No evidence of lymphadenopathy or metastatic disease in the chest, abdomen, or pelvis. 3. Hepatic steatosis. 4. Coronary artery disease.   Aortic Atherosclerosis  (ICD10-I70.0) and Emphysema (ICD10-J43.9).   08/26/2023 [facility] Admission   hospitalization due to was observed speech and jerking movements on right side despite being on dexamethasone . Continuous EEG suggest cortical dysfunction arising from left temporal region.  No seizure or elective form discharges were seen. Patient was discharged on dexamethasone  4 mg, Keppra , lamotrigine .    11/04/2023 -  Chemotherapy   Add Bevacizumab  5mg /kg to Durvalumab .     11/17/2023 - 11/27/2023 Radiation Therapy   Patient received radiation to brain for treatment of recurrence.    12/17/2023 - 12/22/2023 patient was recently hospitalized due to right lower extremity cellulitis.  Patient was treated with vancomycin , cefepime .Noted some improvement.  Patient reports that he was discharged on oral doxycycline  and Augmentin  to complete course.  Patient noticed a worsening of symptoms soon after finishing antibiotics. 12/25/2023 ER visit, was prescribed a course of Ceftin  and he has finishes the course.  He has experienced decreased appetite, loss weight since started on Ceftin .  Right ankle and foot redness have improved. Still has pain of right big toe.   INTERVAL HISTORY Joshua [name] is a 68 y.o. male who has above history reviewed by me today presents for follow up visit for right lung RCC.  Today patient feels nausea, decreased oral intake lately . He has lost weight 20 pounds.  chronic SOB,not worse.  Right lower extremity wound- he follows up with wound care  Review of Systems  Constitutional:  Positive for fatigue. Negative for appetite change, chills, fever and unexpected weight change.  HENT:   Negative for hearing loss and voice change.   Eyes:  Negative for eye problems and icterus.  Respiratory:  Negative for chest tightness, cough and shortness of breath.   Cardiovascular:  Positive for leg swelling. Negative for chest pain.  Gastrointestinal:  Positive for nausea. Negative for abdominal  distention and abdominal pain.  Endocrine: Negative for hot flashes.  Genitourinary:  Negative for difficulty urinating, dysuria and frequency.   Musculoskeletal:  Positive for gait problem. Negative for arthralgias.       Right big toe wound  Skin:  Negative for itching and rash.  Neurological:  Positive for gait problem. Negative for extremity weakness, headaches, numbness and speech difficulty.  Hematological:  Negative for adenopathy. Bruises/bleeds easily.  Psychiatric/Behavioral:  Negative for confusion.       MEDICAL HISTORY:  Past Medical History:  Diagnosis Date   Allergy    Cancer (HCC)    lung   COPD (chronic obstructive pulmonary disease) (HCC)    Dyspnea    former smoker. 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   Malignant neoplasm metastatic to brain [facility])    Peripheral vascular disease (HCC)    Pre-diabetes    Sleep apnea     SURGICAL HISTORY: Past Surgical History:  Procedure Laterality Date   ANTERIOR CERVICAL DECOMP/DISCECTOMY FUSION N/A 03/14/2020   Procedure: ANTERIOR CERVICAL DECOMPRESSION/DISCECTOMY FUSION 3 LEVELS C4-7;  Surgeon: Clois Fret, MD;  Location: [facility] ORS;  Service: Neurosurgery;  Laterality: N/A;   BACK SURGERY  2011   CENTRAL LINE INSERTION Right 09/10/2016   Procedure: CENTRAL LINE INSERTION;  Surgeon: Jama Cordella MATSU, MD;  Location: [facility] ORS;  Service: Vascular;  Laterality: Right;   CERVICAL FUSION     C4-c7 on Mar 14, 2020   COLONOSCOPY  2013 ?   COLONOSCOPY N/A 09/09/2021   Procedure: COLONOSCOPY;  Surgeon: Onita Elspeth Sharper, DO;  Location: [facility] ENDOSCOPY;  Service: Gastroenterology;  Laterality: N/A;   CORONARY ANGIOPLASTY     ESOPHAGOGASTRODUODENOSCOPY N/A 09/09/2021   Procedure: ESOPHAGOGASTRODUODENOSCOPY (EGD);  Surgeon: Onita Elspeth Sharper, DO;  Location: [facility] ENDOSCOPY;  Service: Gastroenterology;  Laterality: N/A;   FEMORAL-POPLITEAL BYPASS GRAFT Right  09/10/2016   Procedure: BYPASS GRAFT FEMORAL-POPLITEAL ARTERY ( BELOW KNEE );  Surgeon: Jama Cordella MATSU, MD;  Location: [facility] ORS;  Service: Vascular;  Laterality: Right;   FEMORAL-TIBIAL BYPASS GRAFT Right 03/23/2020   Procedure: BYPASS GRAFT RIGHT FEMORAL- DISTAL TIBIAL ARTERY WITH DISTAL FLOW GRAFT;  Surgeon: Jama Cordella MATSU, MD;  Location: [facility] ORS;  Service: Vascular;  Laterality: Right;   LEFT HEART CATH AND CORONARY ANGIOGRAPHY Left 05/16/2021   Procedure: LEFT HEART CATH AND CORONARY ANGIOGRAPHY;  Surgeon: Lawyer Bernardino Cough, MD;  Location: [facility] INVASIVE CV LAB;  Service: Cardiovascular;  Laterality: Left;   LOWER EXTREMITY ANGIOGRAPHY Right 11/18/2016   Procedure: Lower Extremity Angiography;  Surgeon: Cordella MATSU Jama, MD;  Location: [facility] INVASIVE CV LAB;  Service: Cardiovascular;  Laterality: Right;   LOWER EXTREMITY ANGIOGRAPHY Right 12/09/2016   Procedure: Lower Extremity Angiography;  Surgeon: Jama Cordella MATSU, MD;  Location: [facility] INVASIVE CV LAB;  Service: Cardiovascular;  Laterality: Right;   LOWER EXTREMITY ANGIOGRAPHY Right 11/15/2019   Procedure: LOWER EXTREMITY ANGIOGRAPHY;  Surgeon: Jama Cordella MATSU, MD;  Location: [facility] INVASIVE CV LAB;  Service: Cardiovascular;  Laterality: Right;   LOWER EXTREMITY ANGIOGRAPHY Right 12/01/2019   Procedure: Lower Extremity Angiography;  Surgeon: Marea Selinda RAMAN, MD;  Location: [facility] INVASIVE CV LAB;  Service: Cardiovascular;  Laterality: Right;   LOWER EXTREMITY ANGIOGRAPHY Right 12/02/2019   Procedure: LOWER EXTREMITY ANGIOGRAPHY;  Surgeon: Jama Cordella MATSU, MD;  Location: [facility] INVASIVE CV LAB;  Service: Cardiovascular;  Laterality: Right;   LOWER EXTREMITY ANGIOGRAPHY Right 03/23/2020   Procedure: Lower Extremity Angiography;  Surgeon: Jama Cordella MATSU, MD;  Location: [facility] INVASIVE CV LAB;  Service: Cardiovascular;  Laterality: Right;   LOWER EXTREMITY ANGIOGRAPHY Right 09/25/2021   Procedure: Lower Extremity Angiography;  Surgeon: Jama Cordella MATSU, MD;  Location: [facility] INVASIVE CV LAB;  Service: Cardiovascular;  Laterality: Right;   LOWER EXTREMITY ANGIOGRAPHY Right 09/26/2021   Procedure: Lower Extremity Angiography;  Surgeon: Marea Selinda RAMAN, MD;  Location: [facility] INVASIVE CV LAB;  Service: Cardiovascular;  Laterality: Right;   LOWER EXTREMITY ANGIOGRAPHY Right 04/08/2022   Procedure: Lower Extremity Angiography;  Surgeon: Jama Cordella MATSU, MD;  Location: [facility] INVASIVE CV LAB;  Service: Cardiovascular;  Laterality: Right;   LOWER EXTREMITY INTERVENTION  11/18/2016   Procedure: Lower Extremity Intervention;  Surgeon: Cordella MATSU Jama, MD;  Location: [facility] INVASIVE CV LAB;  Service: Cardiovascular;;   PERIPHERAL VASCULAR CATHETERIZATION Right 01/02/2015   Procedure: Lower Extremity Angiography;  Surgeon: Cordella MATSU Jama, MD;  Location: [facility] INVASIVE CV LAB;  Service: Cardiovascular;  Laterality: Right;   PERIPHERAL VASCULAR CATHETERIZATION Right 06/18/2015   Procedure: Lower Extremity Angiography;  Surgeon: Selinda RAMAN Marea, MD;  Location: [facility] INVASIVE CV LAB;  Service: Cardiovascular;  Laterality: Right;   PERIPHERAL VASCULAR CATHETERIZATION  06/18/2015   Procedure: Lower Extremity Intervention;  Surgeon: Selinda RAMAN Marea, MD;  Location: [facility] INVASIVE CV LAB;  Service: Cardiovascular;;   PERIPHERAL VASCULAR CATHETERIZATION N/A 10/09/2015  Procedure: Abdominal Aortogram w/Lower Extremity;  Surgeon: Cordella KANDICE Shawl, MD;  Location: [facility] INVASIVE CV LAB;  Service: Cardiovascular;  Laterality: N/A;   PERIPHERAL VASCULAR CATHETERIZATION  10/09/2015   Procedure: Lower Extremity Intervention;  Surgeon: Cordella KANDICE Shawl, MD;  Location: [facility] INVASIVE CV LAB;  Service: Cardiovascular;;   PERIPHERAL VASCULAR CATHETERIZATION Right 10/10/2015   Procedure: Lower Extremity Angiography;  Surgeon: Selinda GORMAN Gu, MD;  Location: [facility] INVASIVE CV LAB;  Service: Cardiovascular;  Laterality: Right;   PERIPHERAL VASCULAR CATHETERIZATION Right 10/10/2015   Procedure:  Lower Extremity Intervention;  Surgeon: Selinda GORMAN Gu, MD;  Location: [facility] INVAS[facility] LAB;  Service: Cardiovascular;  Laterality: Right;   PERIPHERAL VASCULAR CATHETERIZATION Right 12/03/2015   Procedure: Lower Extremity Angiography;  Surgeon: Selinda GORMAN Gu, MD;  Location: [facility] INVAS[facility] LAB;  Service: Cardiovascular;  Laterality: Right;   PERIPHERAL VASCULAR CATHETERIZATION Right 12/04/2015   Procedure: Lower Extremity Angiography;  Surgeon: Selinda GORMAN Gu, MD;  Location: [facility] INV[facility] LAB;  Service: Cardiovascular;  Laterality: Right;   PERIPHERAL VASCULAR CATHETERIZATION  12/04/2015   Procedure: Lower Extremity Intervention;  Surgeon: Selinda GORMAN Gu, MD;  Location: [facility] INVAS[facility] CV LAB;  Service: Cardiovascular;;   PERIPHERAL VASCULAR CATHETERIZATION Right 06/30/2016   Procedure: Lower Extremity Angiography;  Surgeon: Selinda GORMAN Gu, MD;  Location: [facility] INV[facility] LAB;  Service: Cardiovascular;  Laterality: Right;   PERIPHERAL VASCULAR CATHETERIZATION Right 06/25/2016   Procedure: Lower Extremity Angiography;  Surgeon: Cordella KANDICE Shawl, MD;  Location: [facility] INVAS[facility] CV LAB;  Service: Cardiovascular;  Laterality: Right;   PERIPHERAL VASCULAR CATHETERIZATION Right 07/01/2016   Procedure: Lower Extremity Angiography;  Surgeon: Cordella KANDICE Shawl, MD;  Location: [facility] INV[facility] LAB;  Service: Cardiovascular;  Laterality: Right;   PERIPHERAL VASCULAR CATHETERIZATION  07/01/2016   Procedure: Lower Extremity Intervention;  Surgeon: Cordella KANDICE Shawl, MD;  Location: [facility] INVAS[facility] CV LAB;  Service: Cardiovascular;;   PERIPHERAL VASCULAR CATHETERIZATION Right 08/01/2016   Procedure: Lower Extremity Angiography;  Surgeon: Cordella KANDICE Shawl, MD;  Location: [facility] INVASIVE CV LAB;  Service: Cardiovascular;  Laterality: Right;   PORTA CATH INSERTION N/A 10/05/2020   Procedure: PORTA CATH INSERTION;  Surgeon: Shawl Cordella KANDICE, MD;  Location: [facility] INVASIVE CV LAB;  Service: Cardiovascular;  Laterality: N/A;   stent  placement in right leg Right    VIDEO BRONCHOSCOPY N/A 09/14/2020   Procedure: VIDEO BRONCHOSCOPY WITH FLUORO;  Surgeon: Tamea Dedra CROME, MD;  Location: [facility] ORS;  Service: Cardiopulmonary;  Laterality: N/A;   VIDEO BRONCHOSCOPY WITH ENDOBRONCHIAL ULTRASOUND N/A 09/14/2020   Procedure: VIDEO BRONCHOSCOPY WITH ENDOBRONCHIAL ULTRASOUND;  Surgeon: Tamea Dedra CROME, MD;  Location: [facility] ORS;  Service: Cardiopulmonary;  Laterality: N/A;    SOCIAL HISTORY: Social History   Socioeconomic History   Marital status: Single    Spouse name: Not on file   Number of children: Not on file   Years of education: Not on file   Highest education level: Not on file  Occupational History   Occupation: unemployed  Tobacco Use   Smoking status: Former    Current packs/day: 0.00    Average packs/day: 2.0 packs/day for 38.0 years (76.0 ttl pk-yrs)    Types: Cigarettes    Start date: 12/19/1976    Quit date: 12/20/2014    Years since quitting: 9.1   Smokeless tobacco: Never   Tobacco comments:    quit smoking in 2017 most smoked 2   Vaping Use   Vaping status: Former  Substance and Sexual Activity  Alcohol  use: Not Currently   Drug use: No   Sexual activity: Yes  Other Topics Concern   Not on file  Social History Narrative   Not on file   Social Drivers of Health   Financial Resource Strain: Low Risk  (07/27/2023)   Received from [facility]   Overall Financial Resource Strain (CARDIA)    Difficulty of Paying Living Expenses: Not hard at all  Food Insecurity: No Food Insecurity (12/18/2023)   Hunger Vital Sign    Worried About Running Out of Food in the Last Year: Never true    Ran Out of Food in the Last Year: Never true  Transportation Needs: No Transportation Needs (12/18/2023)   PRAPARE - Administrator, Civil Service (Medical): No    Lack of Transportation (Non-Medical): No  Physical Activity: Insufficiently Active (05/29/2023)   Exercise Vital Sign    Days  of Exercise per Week: 2 days    Minutes of Exercise per Session: 10 min  Stress: No Stress Concern Present (05/29/2023)   [employer] of Occupational Health - Occupational Stress Questionnaire    Feeling of Stress : Not at all  Social Connections: Socially Isolated (12/18/2023)   Social Connection and Isolation Panel    Frequency of Communication with Friends and Family: More than three times a week    Frequency of Social Gatherings with Friends and Family: More than three times a week    Attends Religious Services: Never    Database administrator or Organizations: No    Attends Banker Meetings: Never    Marital Status: Never married  Intimate Partner Violence: Not At Risk (12/18/2023)   Humiliation, Afraid, Rape, and Kick questionnaire    Fear of Current or Ex-Partner: No    Emotionally Abused: No    Physically Abused: No    Sexually Abused: No    FAMILY HISTORY: Family History  Problem Relation Age of Onset   Diabetes Mother    Hypertension Mother    Heart murmur Mother    Leukemia Mother    Throat cancer Maternal Grandmother    Colon cancer Neg Hx    Breast cancer Neg Hx     ALLERGIES:  has no known allergies.  MEDICATIONS:  Current Outpatient Medications  Medication Sig Dispense Refill   aspirin  EC 81 MG tablet Take 81 mg by mouth daily. Swallow whole.     clopidogrel  (PLAVIX ) 75 MG tablet TAKE 1 TABLET BY MOUTH DAILY 100 tablet 2   levETIRAcetam  (KEPPRA ) 1000 MG tablet Take 1 tablet (1,000 mg total) by mouth 2 (two) times daily. 60 tablet 2   levothyroxine  (SYNTHROID ) 125 MCG tablet Take 1 tablet (125 mcg total) by mouth daily before breakfast. 90 tablet 0   ondansetron  (ZOFRAN ) 8 MG tablet Take 1 tablet (8 mg total) by mouth every 8 (eight) hours as needed for nausea or vomiting. 20 tablet 0   prochlorperazine  (COMPAZINE ) 10 MG tablet Take 1 tablet (10 mg total) by mouth every 6 (six) hours as needed for nausea or vomiting. 30 tablet 0   rosuvastatin   (CRESTOR ) 40 MG tablet Take 1 tablet (40 mg total) by mouth daily. 90 tablet 3   mupirocin  ointment (BACTROBAN ) 2 % Apply 1 Application topically daily. 22 g 5   No current facility-administered medications for this visit.   Facility-Administered Medications Ordered in Other Visits  Medication Dose Route Frequency Provider Last Rate Last Admin   albuterol  (PROVENTIL ) (2.5 MG/3ML) 0.083%  nebulizer solution 2.5 mg  2.5 mg Nebulization Once Gonzalez, Carmen L, MD       heparin  lock flush 100 unit/mL  500 Units Intracatheter Once PRN [name], MD         PHYSICAL EXAMINATION: ECOG PERFORMANCE STATUS: 1 - Symptomatic but completely ambulatory Vitals:   01/25/24 1335  BP: 121/84  Pulse: (!) 108  Resp: 18  Temp: 98 F (36.7 C)  SpO2: 99%    Filed Weights   01/25/24 1335  Weight: 180 lb 1.6 oz (81.7 kg)     Physical Exam Constitutional:      General: He is not in acute distress.    Appearance: He is obese.  HENT:     Head: Normocephalic and atraumatic.  Eyes:     General: No scleral icterus. Cardiovascular:     Rate and Rhythm: Normal rate and regular rhythm.  Pulmonary:     Effort: Pulmonary effort is normal. No respiratory distress.     Breath sounds: Normal breath sounds. No wheezing.  Abdominal:     General: There is no distension.     Palpations: Abdomen is soft.     Tenderness: There is no abdominal tenderness.  Musculoskeletal:     Cervical back: Normal range of motion and neck supple.     Comments: Right lower extremity wrapped.   Skin:    General: Skin is warm and dry.     Findings: Bruising present. No erythema.  Neurological:     Mental Status: He is alert and oriented to person, place, and time. Mental status is at baseline.     Cranial Nerves: No cranial nerve deficit.     Motor: No abnormal muscle tone.  Psychiatric:        Mood and Affect: Mood and affect normal.         LABORATORY DATA:  I have reviewed the data as listed    Latest Ref Rng  & Units 01/25/2024    1:20 PM 01/08/2024    8:04 AM 12/25/2023   12:36 PM  CBC  WBC 4.0 - 10.5 K/uL 7.2  5.9  7.8   Hemoglobin 13.0 - 17.0 g/dL 86.5  86.5  86.8   Hematocrit 39.0 - 52.0 % 42.4  42.0  40.7   Platelets 150 - 400 K/uL 294  249  336       Latest Ref Rng & Units 01/25/2024    1:20 PM 01/08/2024    8:04 AM 12/25/2023   12:36 PM  CMP  Glucose 70 - 99 mg/dL 94  838  874   BUN 8 - 23 mg/dL 13  7  9    Creatinine 0.61 - 1.24 mg/dL 8.56  8.93  8.80   Sodium 135 - 145 mmol/L 136  136  139   Potassium 3.5 - 5.1 mmol/L 3.1  2.8  3.4   Chloride 98 - 111 mmol/L 97  99  105   CO2 22 - 32 mmol/L 21  26  26    Calcium  8.9 - 10.3 mg/dL 9.2  8.9  8.8   Total Protein 6.5 - 8.1 g/dL 7.4  7.2  6.9   Total Bilirubin 0.0 - 1.2 mg/dL 1.4  0.8  0.9   Alkaline Phos 38 - 126 U/L 79  90  78   AST 15 - 41 U/L 37  53  32   ALT 0 - 44 U/L 21  32  17      RADIOGRAPHIC STUDIES: I have personally  reviewed the radiological images as listed and agreed with the findings in the report. 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 EXAM: MRI HEAD WITHOUT AND WITH CONTRAST TECHNIQUE: Multiplanar, multiecho pulse sequences of the brain and surrounding structures were obtained without and with intravenous contrast. CONTRAST:  9mL GADAVIST  GADOBUTROL  1 MMOL/ML IV SOLN COMPARISON:  MRI of the brain dated November 06, 2023. FINDINGS: Brain: A centrally necrotic, peripherally enhancing lesion within the left parietal lobe has decreased in size in the interim from approximately 3.5 x 2.9 x 2.4 cm to approximately 2.7 x 2.0 x 1.8 cm. A lesion within the left frontal operculum as marginally decreased in size in the interim from 1.4 x 1.4 x 1.6 cm to approximately 1.2 x 1.2 x 1.3 cm. A and nodule previously noted within the right occipital lobe has also marginally in decrease in size in the interim from 8 mm in diameter to 7 mm. The linear  area of enhancement previously noted along the left inferior parietal lobe appears represent a vessel. There are no new or enlarging lesions present. There is moderate periventricular and deep cerebral white matter disease. There is gliosis also noted adjacent to the necrotic lesion within the left parietal lobe. Vascular: Normal vascular flow voids. Skull and upper cervical spine: No osseous lesions are evident. Normal bone marrow signal. Sinuses/Orbits: Mild mucosal disease within the right maxillary sinus. The orbits are unremarkable. Other: None. IMPRESSION: 1. Interval improvement of metastatic disease within the left parietal, left frontal and right occipital lobes. Electronically Signed   By: Evalene Coho M.D.   On: 01/04/2024 13:17   DG Foot 2 Views Right Result Date: 12/25/2023 CLINICAL DATA:  Foot wound EXAM: RIGHT FOOT - 2 VIEW COMPARISON:  None Available. FINDINGS: There is no evidence of fracture or dislocation. There is no evidence of arthropathy or other focal bone abnormality. Soft tissues soft tissue swelling of the dorsum IMPRESSION: Soft tissue swelling of the dorsum of the foot. Electronically Signed   By: Franky Chard M.D.   On: 12/25/2023 13:05   DG Foot 2 Views Right Result Date: 12/17/2023 CLINICAL DATA:  Cellulitis. Right foot pain for 1 month. Wound to big toe. Redness and swelling. EXAM: RIGHT FOOT - 2 VIEW COMPARISON:  None Available. FINDINGS: Generalized soft tissue edema. Site of wound is not well delineated by radiograph. No erosive or bony destructive change. No fracture. Mild degenerative change of the first metatarsal phalangeal joint. No soft tissue gas or radiopaque foreign body. Small plantar calcaneal spur. IMPRESSION: 1. Generalized soft tissue edema. No radiographic evidence of osteomyelitis. 2. Mild degenerative change of the first metatarsophalangeal joint. Electronically Signed   By: Andrea Gasman M.D.   On: 12/17/2023 23:29   DG Chest 2 View Result Date:  12/17/2023 CLINICAL DATA:  Suspected sepsis EXAM: CHEST - 2 VIEW COMPARISON:  02/10/2022 FINDINGS: Right Port-A-Cath remains in place, unchanged. Heart mediastinal contours within normal limits. No acute confluent airspace opacities or effusions. Post treatment scarring noted in the right suprahilar region, unchanged. No acute bony abnormality. IMPRESSION: No active cardiopulmonary disease. Electronically Signed   By: Franky Crease M.D.   On: 12/17/2023 21:05   US  Venous Img Lower Unilateral Right (DVT) Result Date: 11/18/2023 CLINICAL DATA:  Right leg swelling EXAM: RIGHT LOWER EXTREMITY VENOUS DOPPLER ULTRASOUND TECHNIQUE: Gray-scale sonography with compression, as well as color and duplex ultrasound, were performed to evaluate the deep  venous system(s) from the level of the common femoral vein through the popliteal and proximal calf veins. COMPARISON:  None Available. FINDINGS: VENOUS Normal compressibility of the common femoral, superficial femoral, and popliteal veins, as well as the visualized calf veins. Visualized portions of profunda femoral vein and great saphenous vein unremarkable. No filling defects to suggest DVT on grayscale or color Doppler imaging. Doppler waveforms show normal direction of venous flow, normal respiratory plasticity and response to augmentation. Limited views of the contralateral common femoral vein are unremarkable. 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 Limitations: none IMPRESSION: 1. No findings of right lower extremity DVT. 2. 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 If workup of the right lower extremity arterial system is indicated, a CT angiogram runoff study could be performed. Electronically Signed   By: Ryan Salvage M.D.   On: 11/18/2023 17:25   MR Brain W Wo Contrast Result Date: 11/09/2023 CLINICAL DATA:  Brain metastasis,  assess treatment response. History of squamous cell carcinoma of the right lung diagnosed in 2022. Status post chemoradiation. Enhancing lesions noted on brain MRI in 2023. Status post SRS. EXAM: MRI HEAD WITHOUT AND WITH CONTRAST TECHNIQUE: Multiplanar, multiecho pulse sequences of the brain and surrounding structures were obtained without and with intravenous contrast. CONTRAST:  10mL GADAVIST  GADOBUTROL  1 MMOL/ML IV SOLN COMPARISON:  None Available. FINDINGS: Brain: 1.4 x 1.4 x 1.6 cm enhancing mass in the posterior aspect of the left frontal operculum previously measuring 1.8 x 1.6 x 2.2 cm when remeasured in a similar manner (series 1200 image 197, series 9 image 26). Additional centrally necrotic enhancing lesion in the left parietal lobe which measures 3.5 x 2.9 x 2.4 cm, previously 4.5 x 3.1 x 2.9 cm (series 1200, image 241). There is decreased enhancement along the inferior margin of the lesion. Centrally necrotic component appears slightly decreased in size but appears to occupy a greater portion of the lesion compared to prior. Similar enhancement along the superficial/superior margin of the lesion with similar extension to the dura. Redemonstration of extensive vasogenic edema in the left frontoparietal lobes which is overall decreased compared to the prior study with decreased gyral expansion. There is likely slightly decreased involvement of the left basal ganglia and external capsule. Approximately 4 mm rightward midline shift, previously 7 mm. 0.8 cm peripherally enhancing lesion in the right occipital lobe, previously measuring 1.0 cm. Surrounding vasogenic edema is similar to prior. There is a new linear focus of enhancement over the convexity of the left inferior parietal lobule which may reflect a non suppressed vessel (series 1200 image 210). No nodular or masslike enhancement in this region. Otherwise no new enhancing lesions noted. No acute infarct. Similar chronic microhemorrhage in the  mid/posterior left cingulate gyrus. Similar chronic blood products associated with the left cerebral lesions. The basilar cisterns are patent. No extra-axial fluid collection. Vascular: Normal flow voids. Skull and upper cervical spine: Normal marrow signal. Sinuses/Orbits: Focal mucosal thickening in the right posterior ethmoid air cells. Other: None. IMPRESSION: Compared to 10/09/2023 there is decreased size of the left frontal operculum lesion and left parietal lesion. Decreased vasogenic edema in the left frontoparietal lobes with decreased gyral expansion. 4 mm rightward midline shift, previously 7 mm. Similar appearance of right occipital lesion. Focal curvilinear enhancement over the convexity of the inferior left parietal lobe which may reflect a non suppressed vessel. Recommend  attention on follow-up. Electronically Signed   By: Donnice Mania M.D.   On: 11/09/2023 20:28

## 2024-01-25 NOTE — Assessment & Plan Note (Signed)
 Normal TSH continue levothyroxine  125 mcg daily

## 2024-01-25 NOTE — Assessment & Plan Note (Addendum)
 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  Labs are reviewed and discussed with patient. Hold Durvalumab 

## 2024-01-26 ENCOUNTER — Other Ambulatory Visit: Payer: Self-pay

## 2024-01-26 DIAGNOSIS — Z7982 Long term (current) use of aspirin: Secondary | ICD-10-CM | POA: Diagnosis not present

## 2024-01-26 DIAGNOSIS — E785 Hyperlipidemia, unspecified: Secondary | ICD-10-CM | POA: Diagnosis not present

## 2024-01-26 DIAGNOSIS — I872 Venous insufficiency (chronic) (peripheral): Secondary | ICD-10-CM | POA: Diagnosis not present

## 2024-01-26 DIAGNOSIS — C3480 Malignant neoplasm of overlapping sites of unspecified bronchus and lung: Secondary | ICD-10-CM | POA: Diagnosis not present

## 2024-01-26 DIAGNOSIS — J449 Chronic obstructive pulmonary disease, unspecified: Secondary | ICD-10-CM | POA: Diagnosis not present

## 2024-01-26 DIAGNOSIS — G4733 Obstructive sleep apnea (adult) (pediatric): Secondary | ICD-10-CM | POA: Diagnosis not present

## 2024-01-26 DIAGNOSIS — L03115 Cellulitis of right lower limb: Secondary | ICD-10-CM | POA: Diagnosis not present

## 2024-01-26 DIAGNOSIS — E038 Other specified hypothyroidism: Secondary | ICD-10-CM | POA: Diagnosis not present

## 2024-01-26 DIAGNOSIS — G40909 Epilepsy, unspecified, not intractable, without status epilepticus: Secondary | ICD-10-CM | POA: Diagnosis not present

## 2024-01-26 DIAGNOSIS — R7303 Prediabetes: Secondary | ICD-10-CM | POA: Diagnosis not present

## 2024-01-26 DIAGNOSIS — L97811 Non-pressure chronic ulcer of other part of right lower leg limited to breakdown of skin: Secondary | ICD-10-CM | POA: Diagnosis not present

## 2024-01-26 DIAGNOSIS — E876 Hypokalemia: Secondary | ICD-10-CM | POA: Diagnosis not present

## 2024-01-26 DIAGNOSIS — I7389 Other specified peripheral vascular diseases: Secondary | ICD-10-CM | POA: Diagnosis not present

## 2024-01-26 DIAGNOSIS — C7931 Secondary malignant neoplasm of brain: Secondary | ICD-10-CM | POA: Diagnosis not present

## 2024-01-26 DIAGNOSIS — Z7902 Long term (current) use of antithrombotics/antiplatelets: Secondary | ICD-10-CM | POA: Diagnosis not present

## 2024-01-26 DIAGNOSIS — I10 Essential (primary) hypertension: Secondary | ICD-10-CM | POA: Diagnosis not present

## 2024-01-26 LAB — T4: T4, Total: 12.1 ug/dL — ABNORMAL HIGH (ref 4.5–12.0)

## 2024-01-27 ENCOUNTER — Ambulatory Visit

## 2024-01-28 ENCOUNTER — Other Ambulatory Visit: Payer: Self-pay | Admitting: Oncology

## 2024-01-28 ENCOUNTER — Inpatient Hospital Stay

## 2024-01-28 ENCOUNTER — Other Ambulatory Visit: Payer: Self-pay

## 2024-01-28 VITALS — BP 138/82 | HR 87 | Temp 98.1°F | Resp 18 | Ht 71.0 in

## 2024-01-28 DIAGNOSIS — C3491 Malignant neoplasm of unspecified part of right bronchus or lung: Secondary | ICD-10-CM

## 2024-01-28 DIAGNOSIS — Z5112 Encounter for antineoplastic immunotherapy: Secondary | ICD-10-CM | POA: Diagnosis not present

## 2024-01-28 DIAGNOSIS — E876 Hypokalemia: Secondary | ICD-10-CM

## 2024-01-28 DIAGNOSIS — Z95828 Presence of other vascular implants and grafts: Secondary | ICD-10-CM

## 2024-01-28 LAB — BASIC METABOLIC PANEL - CANCER CENTER ONLY
Anion gap: 17 — ABNORMAL HIGH (ref 5–15)
BUN: 8 mg/dL (ref 8–23)
CO2: 23 mmol/L (ref 22–32)
Calcium: 8.9 mg/dL (ref 8.9–10.3)
Chloride: 95 mmol/L — ABNORMAL LOW (ref 98–111)
Creatinine: 1.04 mg/dL (ref 0.61–1.24)
GFR, Estimated: >60 mL/min (ref >60)
Glucose, Bld: 87 mg/dL (ref 70–99)
Potassium: 2.8 mmol/L — ABNORMAL LOW (ref 3.5–5.1)
Sodium: 135 mmol/L (ref 135–145)

## 2024-01-28 MED ORDER — POTASSIUM CHLORIDE 20 MEQ/100ML IV SOLN
20.0000 meq | Freq: Once | INTRAVENOUS | Status: AC
  Administered 2024-01-28: 20 meq via INTRAVENOUS

## 2024-01-28 MED ORDER — SODIUM CHLORIDE 0.9% FLUSH
10.0000 mL | Freq: Once | INTRAVENOUS | Status: AC
  Administered 2024-01-28: 10 mL via INTRAVENOUS
  Filled 2024-01-28: qty 10

## 2024-01-28 MED ORDER — HEPARIN SOD (PORK) LOCK FLUSH 100 UNIT/ML IV SOLN
500.0000 [IU] | Freq: Once | INTRAVENOUS | Status: AC
  Administered 2024-01-28: 500 [IU] via INTRAVENOUS
  Filled 2024-01-28: qty 5

## 2024-01-28 MED ORDER — SODIUM CHLORIDE 0.9 % IV SOLN
INTRAVENOUS | Status: AC
  Filled 2024-01-28: qty 250

## 2024-01-28 MED ORDER — POTASSIUM CHLORIDE CRYS ER 20 MEQ PO TBCR
20.0000 meq | EXTENDED_RELEASE_TABLET | Freq: Every day | ORAL | 0 refills | Status: DC
  Filled 2024-01-28 – 2024-02-01 (×3): qty 7, 7d supply, fill #0
  Filled ????-??-??: fill #0

## 2024-02-01 ENCOUNTER — Other Ambulatory Visit: Payer: Self-pay

## 2024-02-01 ENCOUNTER — Inpatient Hospital Stay

## 2024-02-01 ENCOUNTER — Inpatient Hospital Stay (HOSPITAL_BASED_OUTPATIENT_CLINIC_OR_DEPARTMENT_OTHER): Admitting: Oncology

## 2024-02-01 ENCOUNTER — Encounter: Payer: Self-pay | Admitting: Oncology

## 2024-02-01 ENCOUNTER — Ambulatory Visit: Admitting: Physician Assistant

## 2024-02-01 VITALS — BP 106/74 | HR 98 | Temp 97.2°F | Resp 18 | Wt 192.6 lb

## 2024-02-01 VITALS — BP 122/64 | HR 85

## 2024-02-01 DIAGNOSIS — C3491 Malignant neoplasm of unspecified part of right bronchus or lung: Secondary | ICD-10-CM

## 2024-02-01 DIAGNOSIS — E876 Hypokalemia: Secondary | ICD-10-CM

## 2024-02-01 DIAGNOSIS — E032 Hypothyroidism due to medicaments and other exogenous substances: Secondary | ICD-10-CM

## 2024-02-01 DIAGNOSIS — Z5112 Encounter for antineoplastic immunotherapy: Secondary | ICD-10-CM | POA: Diagnosis not present

## 2024-02-01 DIAGNOSIS — R569 Unspecified convulsions: Secondary | ICD-10-CM

## 2024-02-01 DIAGNOSIS — R112 Nausea with vomiting, unspecified: Secondary | ICD-10-CM | POA: Diagnosis not present

## 2024-02-01 DIAGNOSIS — C7931 Secondary malignant neoplasm of brain: Secondary | ICD-10-CM

## 2024-02-01 LAB — COMPREHENSIVE METABOLIC PANEL WITH GFR
ALT: 16 U/L (ref 0–44)
AST: 34 U/L (ref 15–41)
Albumin: 3.4 g/dL — ABNORMAL LOW (ref 3.5–5.0)
Alkaline Phosphatase: 82 U/L (ref 38–126)
Anion gap: 13 (ref 5–15)
BUN: 7 mg/dL — ABNORMAL LOW (ref 8–23)
CO2: 25 mmol/L (ref 22–32)
Calcium: 8.9 mg/dL (ref 8.9–10.3)
Chloride: 102 mmol/L (ref 98–111)
Creatinine, Ser: 1.07 mg/dL (ref 0.61–1.24)
GFR, Estimated: >60 mL/min (ref >60)
Glucose, Bld: 117 mg/dL — ABNORMAL HIGH (ref 70–99)
Potassium: 2.9 mmol/L — ABNORMAL LOW (ref 3.5–5.1)
Sodium: 140 mmol/L (ref 135–145)
Total Bilirubin: 1.1 mg/dL (ref 0.0–1.2)
Total Protein: 6.6 g/dL (ref 6.5–8.1)

## 2024-02-01 LAB — CBC WITH DIFFERENTIAL/PLATELET
Abs Immature Granulocytes: 0.01 K/uL (ref 0.00–0.07)
Basophils Absolute: 0.1 K/uL (ref 0.0–0.1)
Basophils Relative: 2 %
Eosinophils Absolute: 0.3 K/uL (ref 0.0–0.5)
Eosinophils Relative: 5 %
HCT: 41.5 % (ref 39.0–52.0)
Hemoglobin: 13 g/dL (ref 13.0–17.0)
Immature Granulocytes: 0 %
Lymphocytes Relative: 17 %
Lymphs Abs: 1 K/uL (ref 0.7–4.0)
MCH: 27.7 pg (ref 26.0–34.0)
MCHC: 31.3 g/dL (ref 30.0–36.0)
MCV: 88.3 fL (ref 80.0–100.0)
Monocytes Absolute: 0.7 K/uL (ref 0.1–1.0)
Monocytes Relative: 12 %
Neutro Abs: 3.6 K/uL (ref 1.7–7.7)
Neutrophils Relative %: 64 %
Platelets: 249 K/uL (ref 150–400)
RBC: 4.7 MIL/uL (ref 4.22–5.81)
RDW: 14.6 % (ref 11.5–15.5)
WBC: 5.7 K/uL (ref 4.0–10.5)
nRBC: 0 % (ref 0.0–0.2)

## 2024-02-01 LAB — TOTAL PROTEIN, URINE DIPSTICK: Protein, ur: 30 mg/dL — AB

## 2024-02-01 MED ORDER — POTASSIUM CHLORIDE 20 MEQ/100ML IV SOLN
20.0000 meq | Freq: Once | INTRAVENOUS | Status: AC
  Administered 2024-02-01: 20 meq via INTRAVENOUS

## 2024-02-01 MED ORDER — SODIUM CHLORIDE 0.9 % IV SOLN
INTRAVENOUS | Status: DC
  Filled 2024-02-01 (×2): qty 250

## 2024-02-01 MED ORDER — ONDANSETRON HCL 4 MG/2ML IJ SOLN
8.0000 mg | Freq: Once | INTRAMUSCULAR | Status: AC
  Administered 2024-02-01: 8 mg via INTRAVENOUS
  Filled 2024-02-01: qty 4

## 2024-02-01 MED ORDER — SODIUM CHLORIDE 0.9 % IV SOLN
Freq: Once | INTRAVENOUS | Status: AC
  Filled 2024-02-01: qty 250

## 2024-02-01 MED ORDER — SODIUM CHLORIDE 0.9 % IV SOLN
10.0000 mg/kg | Freq: Once | INTRAVENOUS | Status: AC
  Administered 2024-02-01: 860 mg via INTRAVENOUS
  Filled 2024-02-01: qty 7.2

## 2024-02-01 MED ORDER — SODIUM CHLORIDE 0.9 % IV SOLN
10.0000 mg/kg | Freq: Once | INTRAVENOUS | Status: AC
  Administered 2024-02-01: 900 mg via INTRAVENOUS
  Filled 2024-02-01: qty 4

## 2024-02-01 MED ORDER — HEPARIN SOD (PORK) LOCK FLUSH 100 UNIT/ML IV SOLN
500.0000 [IU] | Freq: Once | INTRAVENOUS | Status: AC | PRN
  Administered 2024-02-01: 500 [IU]
  Filled 2024-02-01: qty 5

## 2024-02-01 MED ORDER — POTASSIUM CHLORIDE CRYS ER 20 MEQ PO TBCR
20.0000 meq | EXTENDED_RELEASE_TABLET | Freq: Every day | ORAL | 0 refills | Status: DC
  Filled 2024-02-01: qty 30, 30d supply, fill #0

## 2024-02-01 MED ORDER — SODIUM CHLORIDE 0.9% FLUSH
10.0000 mL | INTRAVENOUS | Status: DC | PRN
  Administered 2024-02-01: 10 mL
  Filled 2024-02-01: qty 10

## 2024-02-01 MED ORDER — DEXAMETHASONE SODIUM PHOSPHATE 10 MG/ML IJ SOLN
10.0000 mg | Freq: Once | INTRAMUSCULAR | Status: AC
  Administered 2024-02-01: 10 mg via INTRAVENOUS
  Filled 2024-02-01: qty 1

## 2024-02-01 MED FILL — Levetiracetam Tab 1000 MG: ORAL | 30 days supply | Qty: 60 | Fill #1 | Status: AC

## 2024-02-01 NOTE — Assessment & Plan Note (Addendum)
 Unknown etiology. Newer symptoms since last week.  Recommend patient to utilize home antiemetics.  IV zofran  8mg  x 1, IV dex 10mg  x1 today.  He is not on any treatments with high emesis side effects.  I will repeat MRI brain w wo contrast for further evaluation. - discussed with [name].

## 2024-02-01 NOTE — Assessment & Plan Note (Signed)
Durvalumab treatment as planned.  

## 2024-02-01 NOTE — Assessment & Plan Note (Signed)
 Normal TSH continue levothyroxine  125 mcg daily

## 2024-02-01 NOTE — Progress Notes (Signed)
 Hematology/Oncology Progress note [phone] [phone]    CHIEF COMPLAINTS/REASON FOR VISIT:  Follow up for lung cancer  ASSESSMENT & PLAN:   Cancer Staging  Squamous cell carcinoma of right lung (HCC) Staging form: Lung, AJCC 8th Edition - Clinical stage from 09/20/2020: Stage IV (cT1b, cN2, cM1) - Signed by [name], MD on 12/13/2021   Squamous cell carcinoma of right lung (HCC) Durvalumab  was previously held due to pneumonitis.  Labs are reviewed and discussed with patient. Proceed with Durvalumab   Plan to repeat CT scan in the next few weeks, after his nausea.vomiting symptoms are improved.   Encounter for antineoplastic immunotherapy Durvalumab  treatment as planned.   Hypothyroidism due to medication Normal TSH continue levothyroxine  125 mcg daily    Malignant neoplasm metastatic to brain [facility] S/p SRS by Radonc Dr. Dewey.  PET brain finding suggests recurrence -NeuroOnc recommend Radiation and Bevacizumab   Discussed with [name].   MRI brain showed good response.  Proceed with Bevacizumab  today,  Seizure (HCC) Follow up with [name].  Continue keppra  and course of  dexamethasone  as advised by [name].     Nausea with vomiting Unknown etiology. Newer symptoms since last week.  Recommend patient to utilize home antiemetics.  IV zofran  8mg  x 1, IV dex 10mg  x1 today.  He is not on any treatments with high emesis side effects.  I will repeat MRI brain w wo contrast for further evaluation. - discussed with [name].   Hypokalemia IV Kcl 20meq today.  Recommend patient to start oral potassium 20meq daily    Follow up  1 week lab +/- IVF 2 weeks lab MD Durvalumab  All questions were answered. The patient knows to call the clinic with any problems, questions or concerns.  Call Babara, MD, PhD [facility] Hematology Oncology 02/01/2024       HISTORY OF PRESENTING ILLNESS:  Joshua [name] is a 68 y.o. male who has above  history reviewed by me today presents for follow up visit for Stage IV lung squamous cell carcinoma. Oncology History  Squamous cell carcinoma of right lung (HCC)  09/18/2020 Imaging   PET scan showed right upper lobe nodule maximal SUV 11.5.  Hypermetabolic cluster right paratracheal adenopathy with maximum SUV up to 12.  No findings of metastatic disease to the neck/abdomen/pelvis or skeleton.   09/20/2020 Initial Diagnosis   Squamous cell carcinoma of right lung (HCC) NGS showed PD-L1 TPS 95%, LRP1B S997fs, PIK3CA E545K, RB1 c1422-2A>T, RIT1 T83_ A84del, STK11 P281fs, TPS53 R248W, TMB 19.51mut/mb, MS stable.    09/20/2020 Cancer Staging   Staging form: Lung, AJCC 8th Edition - Clinical stage from 09/20/2020: Stage IV (cT1b, cN2, cM1) - Signed by [name], MD on 12/13/2021   10/03/2020 Imaging   MRI brain with and without contrast showed no evidence of intracranial metastatic disease. Well-defined round lesion within the left parotid tail measuring approximately 2.3 x 1.8 x 1.8 cm   10/09/2020 - 11/27/2020 Radiation Therapy   concurrent chemoradiation   10/10/2020 - 10/31/2020 Chemotherapy   10/10/2020 concurrent chemoradiation.  carboplatin  AUC of 2 and Taxol  45 mg/m2 x 4.  Additional chemotherapy was held due to thrombocytopenia, neutropenia       12/31/2020 - 03/21/2022 Chemotherapy   Maintenance  Durvalumab  q14d      12/31/2020 -  Chemotherapy   Patient is on Treatment Plan : LUNG Durvalumab  (10) q14d     03/28/2021 Imaging   CT without contrast showed new linear right perihilar consolidation with associated  traction bronchiectasis.  Likely postradiation changes.  Additional new peribronchial vascular/nodular groundglass opacities with areas of consolidation or seen primarily in the right upper and lower lobes, more distant from expected radiation fields. 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  Per my discussion with pulmonology Dr. 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     05/09/2021 Imaging   Repeat CT chest without contrast showed evolving radiation changes in the right perihilar region likely accounting for interval increase to thickening of the posterior wall of the right upper lobe bronchus.  The more peripheral right lung groundglass opacities seen on most recent study have generally improved with exception of one peripherally right upper lobe which appears new.  These are likely inflammatory.  No findings highly suspicious for local recurrence.   05/30/2021 - 01/10/2022 Chemotherapy   Durvalumab  q14d     10/07/2021 Imaging   MRI brain with and without contrast showed 2 contrast-enhancing lesions, most consistent with metastatic disease.  Largest lesion is in the left frontal lobe and measures up to 1.3 cm with a large amount of surrounding edema and 9 mm rightward midline shift.  The other enhancing lesion was 4 mm in the right occipital lobe with a small amount of surrounding edema.  No acute or chronic hemorrhage.   10/07/2021 Progression   Headache after colonoscopy, progressive difficulty using her right arm.  Progressively worsening of speech. Stage IV lung cancer with brain metastasis.  started on Dexamethasone .  He was seen by neurosurgery and radiation oncology.  Recommendation is to proceed with SRS.  Patient was discharged with dexamethasone    10/16/2021 Imaging   PET scan showed postradiation changes in the right supra hilar lung without evidence of local recurrence.  No evidence of lung cancer recurrence or metastatic disease.  Single focus of intense metabolic activity associated with posterior left 11th rib likely secondary to recent fall/posttraumatic metabolic changes.  Frontal  uptake in the left frontal lobe corresponding to the known metastatic lesion in the brain   10/29/2021 -  Radiation Therapy   Brain Radiation  [facility]   12/10/2021 Imaging   Brain MRI: Continued interval decrease in size of a left frontal lobe metastasis, now measuring 10 mm. Moderate surrounding edema has also decreased.  3-4 mm metastasis within the right occipital lobe, not significantly changed in size. There is now a small focus of central non enhancement within this lesion. Increased edema. 3 mm enhancing metastasis within the high left parietal lobe. In retrospect, this was likely present as a subtle punctate focus of enhancement on the prior brain MRI of 10/22/2021.   01/18/2022 Imaging   CT chest abdomen pelvis w contrast 1. Stable post treatment changes in the right upper lobe medially with dense radiation fibrosis but no findings to suggest local recurrent tumor. 2. Significant progression of ill-defined ground-glass opacity, interstitial thickening and airspace nodularity in the right upper lobe and upper aspect of the right lower lobe. Findings could reflect an inflammatory or atypical infectious process, drug induced pneumonitis or interstitial spread of tumor.3. No mediastinal or hilar mass or adenopathy. 4. No findings for abdominal/pelvic metastatic disease or osseous metastatic disease.   02/19/2022 Imaging   MRI brain w wo contrast showed Three metastatic deposits in the brain appear smaller. Decreased edema in the left parietal lobe. No new lesions    04/04/2022 Imaging   CT chest w contrast 1. Stable  radiation changes in the right perihilar region without evidence of local recurrence. 2. Interval improvement in the patchy ground-glass opacities previously demonstrated in both lungs, most consistent with a resolving infectious or inflammatory process (including immunotherapy-related pneumonitis). 3. No evidence of metastatic disease.   05/14/2022 Miscellaneous   He had a mechanical fall and fractured his left elbow. xrays showed a left elbow fracture  05/26/22 seen by orthopedic surgeon, cast applied.    05/27/2022  Imaging   MRI brain w wo contrast 1. All three known brain metastases have enlarged since August, with increasing regional edema. And there is a small new left anterior cerebellar 4-5 mm metastasis (was questionably punctate on the May MRI). This constellation could reflect a combination of True Progression (Left Cerebellar lesion) and pseudoprogression/radionecrosis (3 existing mets).    2. No significant intracranial mass effect. 3. Underlying moderate cerebral white matter signal changes.   05/30/2022 Imaging   CT chest abdomen pelvis w contrast  1. 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 There is a stable small right pleural effusion, but no definitive findings to suggest residual or recurrent disease on today's examination. 2. No signs of metastatic disease in the abdomen or pelvis.  3. Aortic atherosclerosis, in addition to two-vessel coronary artery disease. Please note that although the presence of coronary artery calcium  documents the presence of coronary artery disease, the severity of this disease and any potential stenosis cannot be assessed on this non-gated CT examination. Assessment for potential risk factor modification, dietary therapy or pharmacologic therapy may be warranted, if clinically indicated. 4. Hepatic steatosis. 5. Colonic diverticulosis without evidence of acute diverticulitis at this time. 6. Incompletely imaged but apparently occluded vascular graft in the superior aspect of the right anterior thigh. Referral to vascular specialist for further clinical evaluation should be considered if clinically appropriate. 7. Additional incidental findings, as above.   07/22/2022 Imaging   MRI brain  1. No new or enlarging lesions. No intracranial disease progression. 2. Decreased size of treated lesions of the left frontoparietal junction and left  postcentral gyrus. 3. Unchanged lesions in the left cerebellum and right occipital lobe.   10/15/2022 Imaging   MRI brain w wo contrast showed Interval increase in size of multiple intraparenchymal lesions with worsened surrounding vasogenic edema. 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 No new lesions   11/05/2022 Imaging   MRI brain w wo contrast  1. Likely slight increase and right occipital and left cerebellar lesions. Otherwise, unchanged metastases, as above. No new lesions identified. 2. Partially imaged large probable lymph node in the left upper neck, apparent on prior PET CT.   11/13/2022 -  Radiation Therapy   Each of the sites were treated with SRS/SBRT/SRT-IMRT technique to 20 Gy in 1 fraction to each of the following sites: PTV_2_LCerebel_45mm PTV_3_RCerebel_96mm PTV_4_ROccip_72mm PTV_5_LParietal_79mm   11/27/2022 Imaging   CT chest abdomen pelvis w contrast showed  Improving bilateral scattered interstitial and ground-glass opacities. Small amount of residual areas in the extreme left lower lobe inferiorly. This specific area is slightly increased from previous but overall the amount of opacities is significantly improved. Recommend continued follow-up. No developing new mass lesion, fluid collection or lymph node enlargement. Stable posttreatment changes right perihilar in the lung. Fatty liver infiltration. Colonic diverticulosis. Improved tiny right pleural effusion   02/05/2023 Imaging   MRI brain with and without contrast Interval decrease  in all of the metastatic lesions, detailed above.No new lesions identified.    Imaging   CT chest abdomen pelvis w contrast showed  1. 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 2. Trace right pleural fluid or thickening, new or minimally increased. 3. Incidental findings, including: Aortic  atherosclerosis (ICD10-I70.0), coronary artery atherosclerosis and emphysema (ICD10-J43.9). Hepatic steatosis.   06/25/2023 Imaging   CT chest abdomen pelvis w contrast showed 1. Unchanged post treatment/post radiation appearance of the right lung, with perihilar and suprahilar paramedian fibrosis and consolidation. 2. No evidence of lymphadenopathy or metastatic disease in the chest, abdomen, or pelvis. 3. Hepatic steatosis. 4. Coronary artery disease.   Aortic Atherosclerosis (ICD10-I70.0) and Emphysema (ICD10-J43.9).   08/26/2023 [facility] Admission   hospitalization due to was observed speech and jerking movements on right side despite being on dexamethasone . Continuous EEG suggest cortical dysfunction arising from left temporal region.  No seizure or elective form discharges were seen. Patient was discharged on dexamethasone  4 mg, Keppra , lamotrigine .    11/04/2023 -  Chemotherapy   Add Bevacizumab  5mg /kg to Durvalumab .     11/17/2023 - 11/27/2023 Radiation Therapy   Patient received radiation to brain for treatment of recurrence.    12/17/2023 - 12/22/2023 patient was recently hospitalized due to right lower extremity cellulitis.  Patient was treated with vancomycin , cefepime .Noted some improvement.  Patient reports that he was discharged on oral doxycycline  and Augmentin  to complete course.  Patient noticed a worsening of symptoms soon after finishing antibiotics. 12/25/2023 ER visit, was prescribed a course of Ceftin  and he has finishes the course.  He has experienced decreased appetite, loss weight since started on Ceftin .  Right ankle and foot redness have improved. Still has pain of right big toe.   INTERVAL HISTORY Joshua [name] is a 68 y.o. male who has above history reviewed by me today presents for follow up visit for right lung RCC.  chronic SOB,not worse.  Right lower extremity wound- he follows up with wound care He feels nauseated today, has had intermittent non  bloody non bilious vomiting episodes. Decreased oral intake.  No abdominal pain, new focal deficits.  He was recommended to start potassium supplements. He has not pick up rx and started yet.    Review of Systems  Constitutional:  Positive for fatigue. Negative for appetite change, chills, fever and unexpected weight change.  HENT:   Negative for hearing loss and voice change.   Eyes:  Negative for eye problems and icterus.  Respiratory:  Negative for chest tightness, cough and shortness of breath.   Cardiovascular:  Positive for leg swelling. Negative for chest pain.  Gastrointestinal:  Positive for nausea and vomiting. Negative for abdominal distention and abdominal pain.  Endocrine: Negative for hot flashes.  Genitourinary:  Negative for difficulty urinating, dysuria and frequency.   Musculoskeletal:  Positive for gait problem. Negative for arthralgias.       Right big toe wound  Skin:  Negative for itching and rash.  Neurological:  Positive for gait problem. Negative for extremity weakness, headaches, numbness and speech difficulty.  Hematological:  Negative for adenopathy. Bruises/bleeds easily.  Psychiatric/Behavioral:  Negative for confusion.       MEDICAL HISTORY:  Past Medical History:  Diagnosis Date   Allergy    Cancer (HCC)    lung   COPD (chronic obstructive pulmonary disease) (HCC)    Dyspnea    former smoker. only doe   History of kidney  stones    Hypertension    Hypothyroidism due to medication 04/04/2021   Malignant neoplasm metastatic to brain [facility])    Nausea with vomiting 02/01/2024   Peripheral vascular disease (HCC)    Pre-diabetes    Sleep apnea     SURGICAL HISTORY: Past Surgical History:  Procedure Laterality Date   ANTERIOR CERVICAL DECOMP/DISCECTOMY FUSION N/A 03/14/2020   Procedure: ANTERIOR CERVICAL DECOMPRESSION/DISCECTOMY FUSION 3 LEVELS C4-7;  Surgeon: Clois Fret, MD;  Location: [facility] ORS;  Service: Neurosurgery;  Laterality: N/A;    BACK SURGERY  2011   CENTRAL LINE INSERTION Right 09/10/2016   Procedure: CENTRAL LINE INSERTION;  Surgeon: Jama Cordella MATSU, MD;  Location: [facility] ORS;  Service: Vascular;  Laterality: Right;   CERVICAL FUSION     C4-c7 on Mar 14, 2020   COLONOSCOPY  2013 ?   COLONOSCOPY N/A 09/09/2021   Procedure: COLONOSCOPY;  Surgeon: Onita Elspeth Sharper, DO;  Location: [facility] ENDOSCOPY;  Service: Gastroenterology;  Laterality: N/A;   CORONARY ANGIOPLASTY     ESOPHAGOGASTRODUODENOSCOPY N/A 09/09/2021   Procedure: ESOPHAGOGASTRODUODENOSCOPY (EGD);  Surgeon: Onita Elspeth Sharper, DO;  Location: [facility] ENDOSCOPY;  Service: Gastroenterology;  Laterality: N/A;   FEMORAL-POPLITEAL BYPASS GRAFT Right 09/10/2016   Procedure: BYPASS GRAFT FEMORAL-POPLITEAL ARTERY ( BELOW KNEE );  Surgeon: Jama Cordella MATSU, MD;  Location: [facility] ORS;  Service: Vascular;  Laterality: Right;   FEMORAL-TIBIAL BYPASS GRAFT Right 03/23/2020   Procedure: BYPASS GRAFT RIGHT FEMORAL- DISTAL TIBIAL ARTERY WITH DISTAL FLOW GRAFT;  Surgeon: Jama Cordella MATSU, MD;  Location: [facility] ORS;  Service: Vascular;  Laterality: Right;   LEFT HEART CATH AND CORONARY ANGIOGRAPHY Left 05/16/2021   Procedure: LEFT HEART CATH AND CORONARY ANGIOGRAPHY;  Surgeon: Lawyer Bernardino Cough, MD;  Location: [facility] INVASIVE CV LAB;  Service: Cardiovascular;  Laterality: Left;   LOWER EXTREMITY ANGIOGRAPHY Right 11/18/2016   Procedure: Lower Extremity Angiography;  Surgeon: Cordella MATSU Jama, MD;  Location: [facility] INVASIVE CV LAB;  Service: Cardiovascular;  Laterality: Right;   LOWER EXTREMITY ANGIOGRAPHY Right 12/09/2016   Procedure: Lower Extremity Angiography;  Surgeon: Jama Cordella MATSU, MD;  Location: [facility] INVASIVE CV LAB;  Service: Cardiovascular;  Laterality: Right;   LOWER EXTREMITY ANGIOGRAPHY Right 11/15/2019   Procedure: LOWER EXTREMITY ANGIOGRAPHY;  Surgeon: Jama Cordella MATSU, MD;  Location: [facility] INVASIVE CV LAB;  Service: Cardiovascular;  Laterality: Right;   LOWER  EXTREMITY ANGIOGRAPHY Right 12/01/2019   Procedure: Lower Extremity Angiography;  Surgeon: Marea Selinda RAMAN, MD;  Location: [facility] INVASIVE CV LAB;  Service: Cardiovascular;  Laterality: Right;   LOWER EXTREMITY ANGIOGRAPHY Right 12/02/2019   Procedure: LOWER EXTREMITY ANGIOGRAPHY;  Surgeon: Jama Cordella MATSU, MD;  Location: [facility] INVASIVE CV LAB;  Service: Cardiovascular;  Laterality: Right;   LOWER EXTREMITY ANGIOGRAPHY Right 03/23/2020   Procedure: Lower Extremity Angiography;  Surgeon: Jama Cordella MATSU, MD;  Location: [facility] INVASIVE CV LAB;  Service: Cardiovascular;  Laterality: Right;   LOWER EXTREMITY ANGIOGRAPHY Right 09/25/2021   Procedure: Lower Extremity Angiography;  Surgeon: Jama Cordella MATSU, MD;  Location: [facility] INVASIVE CV LAB;  Service: Cardiovascular;  Laterality: Right;   LOWER EXTREMITY ANGIOGRAPHY Right 09/26/2021   Procedure: Lower Extremity Angiography;  Surgeon: Marea Selinda RAMAN, MD;  Location: [facility] INVASIVE CV LAB;  Service: Cardiovascular;  Laterality: Right;   LOWER EXTREMITY ANGIOGRAPHY Right 04/08/2022   Procedure: Lower Extremity Angiography;  Surgeon: Jama Cordella MATSU, MD;  Location: [facility] INVASIVE CV LAB;  Service: Cardiovascular;  Laterality: Right;   LOWER EXTREMITY INTERVENTION  11/18/2016   Procedure: Lower  Extremity Intervention;  Surgeon: Cordella KANDICE Shawl, MD;  Location: [facility] INVASIVE CV LAB;  Service: Cardiovascular;;   PERIPHERAL VASCULAR CATHETERIZATION Right 01/02/2015   Procedure: Lower Extremity Angiography;  Surgeon: Cordella KANDICE Shawl, MD;  Location: [facility] INVASIVE CV LAB;  Service: Cardiovascular;  Laterality: Right;   PERIPHERAL VASCULAR CATHETERIZATION Right 06/18/2015   Procedure: Lower Extremity Angiography;  Surgeon: Selinda GORMAN Gu, MD;  Location: [facility] INVASIVE CV LAB;  Service: Cardiovascular;  Laterality: Right;   PERIPHERAL VASCULAR CATHETERIZATION  06/18/2015   Procedure: Lower Extremity Intervention;  Surgeon: Selinda GORMAN Gu, MD;  Location: [facility] INVASIVE CV LAB;   Service: Cardiovascular;;   PERIPHERAL VASCULAR CATHETERIZATION N/A 10/09/2015   Procedure: Abdominal Aortogram w/Lower Extremity;  Surgeon: Cordella KANDICE Shawl, MD;  Location: [facility] INVASIVE CV LAB;  Service: Cardiovascular;  Laterality: N/A;   PERIPHERAL VASCULAR CATHETERIZATION  10/09/2015   Procedure: Lower Extremity Intervention;  Surgeon: Cordella KANDICE Shawl, MD;  Location: [facility] INVASIVE CV LAB;  Service: Cardiovascular;;   PERIPHERAL VASCULAR CATHETERIZATION Right 10/10/2015   Procedure: Lower Extremity Angiography;  Surgeon: Selinda GORMAN Gu, MD;  Location: [facility] INVASIVE CV LAB;  Service: Cardiovascular;  Laterality: Right;   PERIPHERAL VASCULAR CATHETERIZATION Right 10/10/2015   Procedure: Lower Extremity Intervention;  Surgeon: Selinda GORMAN Gu, MD;  Location: [facility] INVASIVE CV LAB;  Service: Cardiovascular;  Laterality: Right;   PERIPHERAL VASCULAR CATHETERIZATION Right 12/03/2015   Procedure: Lower Extremity Angiography;  Surgeon: Selinda GORMAN Gu, MD;  Location: [facility] INVASIVE CV LAB;  Service: Cardiovascular;  Laterality: Right;   PERIPHERAL VASCULAR CATHETERIZATION Right 12/04/2015   Procedure: Lower Extremity Angiography;  Surgeon: Selinda GORMAN Gu, MD;  Location: [facility] INVASIVE CV LAB;  Service: Cardiovascular;  Laterality: Right;   PERIPHERAL VASCULAR CATHETERIZATION  12/04/2015   Procedure: Lower Extremity Intervention;  Surgeon: Selinda GORMAN Gu, MD;  Location: [facility] INVASIVE CV LAB;  Service: Cardiovascular;;   PERIPHERAL VASCULAR CATHETERIZATION Right 06/30/2016   Procedure: Lower Extremity Angiography;  Surgeon: Selinda GORMAN Gu, MD;  Location: [facility] INVASIVE CV LAB;  Service: Cardiovascular;  Laterality: Right;   PERIPHERAL VASCULAR CATHETERIZATION Right 06/25/2016   Procedure: Lower Extremity Angiography;  Surgeon: Cordella KANDICE Shawl, MD;  Location: [facility] INVASIVE CV LAB;  Service: Cardiovascular;  Laterality: Right;   PERIPHERAL VASCULAR CATHETERIZATION Right 07/01/2016   Procedure: Lower Extremity Angiography;   Surgeon: Cordella KANDICE Shawl, MD;  Location: [facility] INVASIVE CV LAB;  Service: Cardiovascular;  Laterality: Right;   PERIPHERAL VASCULAR CATHETERIZATION  07/01/2016   Procedure: Lower Extremity Intervention;  Surgeon: Cordella KANDICE Shawl, MD;  Location: [facility] INVASIVE CV LAB;  Service: Cardiovascular;;   PERIPHERAL VASCULAR CATHETERIZATION Right 08/01/2016   Procedure: Lower Extremity Angiography;  Surgeon: Cordella KANDICE Shawl, MD;  Location: [facility] INVASIVE CV LAB;  Service: Cardiovascular;  Laterality: Right;   PORTA CATH INSERTION N/A 10/05/2020   Procedure: PORTA CATH INSERTION;  Surgeon: Shawl Cordella KANDICE, MD;  Location: [facility] INVASIVE CV LAB;  Service: Cardiovascular;  Laterality: N/A;   stent placement in right leg Right    VIDEO BRONCHOSCOPY N/A 09/14/2020   Procedure: VIDEO BRONCHOSCOPY WITH FLUORO;  Surgeon: Tamea Dedra CROME, MD;  Location: [facility] ORS;  Service: Cardiopulmonary;  Laterality: N/A;   VIDEO BRONCHOSCOPY WITH ENDOBRONCHIAL ULTRASOUND N/A 09/14/2020   Procedure: VIDEO BRONCHOSCOPY WITH ENDOBRONCHIAL ULTRASOUND;  Surgeon: Tamea Dedra CROME, MD;  Location: [facility] ORS;  Service: Cardiopulmonary;  Laterality: N/A;    SOCIAL HISTORY: Social History   Socioeconomic History   Marital status: Single    Spouse name: Not on file  Number of children: Not on file   Years of education: Not on file   Highest education level: Not on file  Occupational History   Occupation: unemployed  Tobacco Use   Smoking status: Former    Current packs/day: 0.00    Average packs/day: 2.0 packs/day for 38.0 years (76.0 ttl pk-yrs)    Types: Cigarettes    Start date: 12/19/1976    Quit date: 12/20/2014    Years since quitting: 9.1   Smokeless tobacco: Never   Tobacco comments:    quit smoking in 2017 most smoked 2   Vaping Use   Vaping status: Former  Substance and Sexual Activity   Alcohol  use: Not Currently   Drug use: No   Sexual activity: Yes  Other Topics Concern   Not on file  Social History  Narrative   Not on file   Social Drivers of Health   Financial Resource Strain: Low Risk  (07/27/2023)   Received from [facility]   Overall Financial Resource Strain (CARDIA)    Difficulty of Paying Living Expenses: Not hard at all  Food Insecurity: No Food Insecurity (12/18/2023)   Hunger Vital Sign    Worried About Running Out of Food in the Last Year: Never true    Ran Out of Food in the Last Year: Never true  Transportation Needs: No Transportation Needs (12/18/2023)   PRAPARE - Administrator, Civil Service (Medical): No    Lack of Transportation (Non-Medical): No  Physical Activity: Insufficiently Active (05/29/2023)   Exercise Vital Sign    Days of Exercise per Week: 2 days    Minutes of Exercise per Session: 10 min  Stress: No Stress Concern Present (05/29/2023)   [employer] of Occupational Health - Occupational Stress Questionnaire    Feeling of Stress : Not at all  Social Connections: Socially Isolated (12/18/2023)   Social Connection and Isolation Panel    Frequency of Communication with Friends and Family: More than three times a week    Frequency of Social Gatherings with Friends and Family: More than three times a week    Attends Religious Services: Never    Database administrator or Organizations: No    Attends Banker Meetings: Never    Marital Status: Never married  Intimate Partner Violence: Not At Risk (12/18/2023)   Humiliation, Afraid, Rape, and Kick questionnaire    Fear of Current or Ex-Partner: No    Emotionally Abused: No    Physically Abused: No    Sexually Abused: No    FAMILY HISTORY: Family History  Problem Relation Age of Onset   Diabetes Mother    Hypertension Mother    Heart murmur Mother    Leukemia Mother    Throat cancer Maternal Grandmother    Colon cancer Neg Hx    Breast cancer Neg Hx     ALLERGIES:  has no known allergies.  MEDICATIONS:  Current Outpatient Medications  Medication  Sig Dispense Refill   aspirin  EC 81 MG tablet Take 81 mg by mouth daily. Swallow whole.     clopidogrel  (PLAVIX ) 75 MG tablet TAKE 1 TABLET BY MOUTH DAILY 100 tablet 2   levETIRAcetam  (KEPPRA ) 1000 MG tablet Take 1 tablet (1,000 mg total) by mouth 2 (two) times daily. 60 tablet 2   levothyroxine  (SYNTHROID ) 125 MCG tablet Take 1 tablet (125 mcg total) by mouth daily before breakfast. 90 tablet 0   mupirocin  ointment (BACTROBAN ) 2 % Apply 1  Application topically daily. 22 g 5   ondansetron  (ZOFRAN ) 8 MG tablet Take 1 tablet (8 mg total) by mouth every 8 (eight) hours as needed for nausea or vomiting. 20 tablet 0   prochlorperazine  (COMPAZINE ) 10 MG tablet Take 1 tablet (10 mg total) by mouth every 6 (six) hours as needed for nausea or vomiting. 30 tablet 0   rosuvastatin  (CRESTOR ) 40 MG tablet Take 1 tablet (40 mg total) by mouth daily. 90 tablet 3   potassium chloride  SA (KLOR-CON  M) 20 MEQ tablet Take 1 tablet (20 mEq total) by mouth daily. 30 tablet 0   No current facility-administered medications for this visit.   Facility-Administered Medications Ordered in Other Visits  Medication Dose Route Frequency Provider Last Rate Last Admin   albuterol  (PROVENTIL ) (2.5 MG/3ML) 0.083% nebulizer solution 2.5 mg  2.5 mg Nebulization Once Tamea Dedra CROME, MD       durvalumab  (IMFINZI ) 860 mg in sodium chloride  0.9 % 100 mL chemo infusion  10 mg/kg (Treatment Plan Recorded) Intravenous Once [name], MD       heparin  lock flush 100 unit/mL  500 Units Intracatheter Once PRN [name], MD       heparin  lock flush 100 unit/mL  500 Units Intracatheter Once PRN [name], MD       potassium chloride  20 mEq in 100 mL IVPB  20 mEq Intravenous Once [name], MD 50 mL/hr at 02/01/24 0947 20 mEq at 02/01/24 0947   sodium chloride  flush (NS) 0.9 % injection 10 mL  10 mL Intracatheter PRN [name], MD         PHYSICAL EXAMINATION: ECOG PERFORMANCE STATUS: 1 - Symptomatic but completely ambulatory Vitals:    02/01/24 0840  BP: 106/74  Pulse: 98  Resp: 18  Temp: (!) 97.2 F (36.2 C)  SpO2: 100%    Filed Weights   02/01/24 0840  Weight: 192 lb 9.6 oz (87.4 kg)     Physical Exam Constitutional:      General: He is not in acute distress.    Appearance: He is obese.  HENT:     Head: Normocephalic and atraumatic.  Eyes:     General: No scleral icterus. Cardiovascular:     Rate and Rhythm: Normal rate and regular rhythm.  Pulmonary:     Effort: Pulmonary effort is normal. No respiratory distress.     Breath sounds: Normal breath sounds. No wheezing.  Abdominal:     General: There is no distension.     Palpations: Abdomen is soft.     Tenderness: There is no abdominal tenderness.  Musculoskeletal:     Cervical back: Normal range of motion and neck supple.     Comments: Right lower extremity wrapped.   Skin:    General: Skin is warm and dry.     Findings: Bruising present. No erythema.  Neurological:     Mental Status: He is alert and oriented to person, place, and time. Mental status is at baseline.     Cranial Nerves: No cranial nerve deficit.     Motor: No abnormal muscle tone.  Psychiatric:        Mood and Affect: Mood and affect normal.         LABORATORY DATA:  I have reviewed the data as listed    Latest Ref Rng & Units 02/01/2024    8:23 AM 01/25/2024    1:20 PM 01/08/2024    8:04 AM  CBC  WBC 4.0 - 10.5 K/uL  5.7  7.2  5.9   Hemoglobin 13.0 - 17.0 g/dL 86.9  86.5  86.5   Hematocrit 39.0 - 52.0 % 41.5  42.4  42.0   Platelets 150 - 400 K/uL 249  294  249       Latest Ref Rng & Units 02/01/2024    8:23 AM 01/28/2024    1:18 PM 01/25/2024    1:20 PM  CMP  Glucose 70 - 99 mg/dL 882  87  94   BUN 8 - 23 mg/dL 7  8  13    Creatinine 0.61 - 1.24 mg/dL 8.92  8.95  8.56   Sodium 135 - 145 mmol/L 140  135  136   Potassium 3.5 - 5.1 mmol/L 2.9  2.8  3.1   Chloride 98 - 111 mmol/L 102  95  97   CO2 22 - 32 mmol/L 25  23  21    Calcium  8.9 - 10.3 mg/dL 8.9  8.9  9.2    Total Protein 6.5 - 8.1 g/dL 6.6   7.4   Total Bilirubin 0.0 - 1.2 mg/dL 1.1   1.4   Alkaline Phos 38 - 126 U/L 82   79   AST 15 - 41 U/L 34   37   ALT 0 - 44 U/L 16   21      RADIOGRAPHIC STUDIES: I have personally reviewed the radiological images as listed and agreed with the findings in the report. 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 EXAM: MRI HEAD WITHOUT AND WITH CONTRAST TECHNIQUE: Multiplanar, multiecho pulse sequences of the brain and surrounding structures were obtained without and with intravenous contrast. CONTRAST:  9mL GADAVIST  GADOBUTROL  1 MMOL/ML IV SOLN COMPARISON:  MRI of the brain dated November 06, 2023. FINDINGS: Brain: A centrally necrotic, peripherally enhancing lesion within the left parietal lobe has decreased in size in the interim from approximately 3.5 x 2.9 x 2.4 cm to approximately 2.7 x 2.0 x 1.8 cm. A lesion within the left frontal operculum as marginally decreased in size in the interim from 1.4 x 1.4 x 1.6 cm to approximately 1.2 x 1.2 x 1.3 cm. A and nodule previously noted within the right occipital lobe has also marginally in decrease in size in the interim from 8 mm in diameter to 7 mm. The linear area of enhancement previously noted along the left inferior parietal lobe appears represent a vessel. There are no new or enlarging lesions present. There is moderate periventricular and deep cerebral white matter disease. There is gliosis also noted adjacent to the necrotic lesion within the left parietal lobe. Vascular: Normal vascular flow voids. Skull and upper cervical spine: No osseous lesions are evident. Normal bone marrow signal. Sinuses/Orbits: Mild mucosal disease within the right maxillary sinus. The orbits are unremarkable. Other: None. IMPRESSION: 1. Interval improvement of metastatic disease within the left parietal, left frontal and right occipital lobes.  Electronically Signed   By: Evalene Coho M.D.   On: 01/04/2024 13:17   DG Foot 2 Views Right Result Date: 12/25/2023 CLINICAL DATA:  Foot wound EXAM: RIGHT FOOT - 2 VIEW COMPARISON:  None Available. FINDINGS: There is no evidence of fracture or dislocation. There is no evidence of arthropathy or other focal bone abnormality. Soft tissues soft tissue swelling of the dorsum IMPRESSION: Soft tissue swelling of the dorsum of the foot. Electronically Signed   By: Franky Tor HERO.D.  On: 12/25/2023 13:05   DG Foot 2 Views Right Result Date: 12/17/2023 CLINICAL DATA:  Cellulitis. Right foot pain for 1 month. Wound to big toe. Redness and swelling. EXAM: RIGHT FOOT - 2 VIEW COMPARISON:  None Available. FINDINGS: Generalized soft tissue edema. Site of wound is not well delineated by radiograph. No erosive or bony destructive change. No fracture. Mild degenerative change of the first metatarsal phalangeal joint. No soft tissue gas or radiopaque foreign body. Small plantar calcaneal spur. IMPRESSION: 1. Generalized soft tissue edema. No radiographic evidence of osteomyelitis. 2. Mild degenerative change of the first metatarsophalangeal joint. Electronically Signed   By: Andrea Gasman M.D.   On: 12/17/2023 23:29   DG Chest 2 View Result Date: 12/17/2023 CLINICAL DATA:  Suspected sepsis EXAM: CHEST - 2 VIEW COMPARISON:  02/10/2022 FINDINGS: Right Port-A-Cath remains in place, unchanged. Heart mediastinal contours within normal limits. No acute confluent airspace opacities or effusions. Post treatment scarring noted in the right suprahilar region, unchanged. No acute bony abnormality. IMPRESSION: No active cardiopulmonary disease. Electronically Signed   By: Franky Crease M.D.   On: 12/17/2023 21:05   US  Venous Img Lower Unilateral Right (DVT) Result Date: 11/18/2023 CLINICAL DATA:  Right leg swelling EXAM: RIGHT LOWER EXTREMITY VENOUS DOPPLER ULTRASOUND TECHNIQUE: Gray-scale sonography with compression, as  well as color and duplex ultrasound, were performed to evaluate the deep venous system(s) from the level of the common femoral vein through the popliteal and proximal calf veins. COMPARISON:  None Available. FINDINGS: VENOUS Normal compressibility of the common femoral, superficial femoral, and popliteal veins, as well as the visualized calf veins. Visualized portions of profunda femoral vein and great saphenous vein unremarkable. No filling defects to suggest DVT on grayscale or color Doppler imaging. Doppler waveforms show normal direction of venous flow, normal respiratory plasticity and response to augmentation. Limited views of the contralateral common femoral vein are unremarkable. 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 Limitations: none IMPRESSION: 1. No findings of right lower extremity DVT. 2. 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 If workup of the right lower extremity arterial system is indicated, a CT angiogram runoff study could be performed. Electronically Signed   By: Ryan Salvage M.D.   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 Status post chemoradiation. Enhancing lesions noted on brain MRI in 2023. Status post SRS. EXAM: MRI HEAD WITHOUT AND WITH CONTRAST TECHNIQUE: Multiplanar, multiecho pulse sequences of the brain and surrounding structures were obtained without and with intravenous contrast. CONTRAST:  10mL GADAVIST  GADOBUTROL  1 MMOL/ML IV SOLN COMPARISON:  None Available. FINDINGS: Brain: 1.4 x 1.4 x 1.6 cm enhancing mass in the posterior aspect of the left frontal operculum previously measuring 1.8 x 1.6 x 2.2 cm when remeasured in a similar manner (series 1200 image 197, series 9 image  26). Additional centrally necrotic enhancing lesion in the left parietal lobe which measures 3.5 x 2.9 x 2.4 cm, previously 4.5 x 3.1 x 2.9 cm (series 1200, image 241). There is decreased enhancement along the inferior margin of the lesion. Centrally necrotic component appears slightly decreased in size but appears to occupy a greater portion of the lesion compared to prior. Similar enhancement along the superficial/superior margin of the lesion  with similar extension to the dura. Redemonstration of extensive vasogenic edema in the left frontoparietal lobes which is overall decreased compared to the prior study with decreased gyral expansion. There is likely slightly decreased involvement of the left basal ganglia and external capsule. Approximately 4 mm rightward midline shift, previously 7 mm. 0.8 cm peripherally enhancing lesion in the right occipital lobe, previously measuring 1.0 cm. Surrounding vasogenic edema is similar to prior. There is a new linear focus of enhancement over the convexity of the left inferior parietal lobule which may reflect a non suppressed vessel (series 1200 image 210). No nodular or masslike enhancement in this region. Otherwise no new enhancing lesions noted. No acute infarct. Similar chronic microhemorrhage in the mid/posterior left cingulate gyrus. Similar chronic blood products associated with the left cerebral lesions. The basilar cisterns are patent. No extra-axial fluid collection. Vascular: Normal flow voids. Skull and upper cervical spine: Normal marrow signal. Sinuses/Orbits: Focal mucosal thickening in the right posterior ethmoid air cells. Other: None. IMPRESSION: Compared to 10/09/2023 there is decreased size of the left frontal operculum lesion and left parietal lesion. Decreased vasogenic edema in the left frontoparietal lobes with decreased gyral expansion. 4 mm rightward midline shift, previously 7 mm. Similar appearance of right occipital lesion. Focal curvilinear  enhancement over the convexity of the inferior left parietal lobe which may reflect a non suppressed vessel. Recommend attention on follow-up. Electronically Signed   By: Donnice Mania M.D.   On: 11/09/2023 20:28

## 2024-02-01 NOTE — Assessment & Plan Note (Signed)
 IV Kcl 20meq today.  Recommend patient to start oral potassium 20meq daily

## 2024-02-01 NOTE — Progress Notes (Signed)
 Ok per Dr Babara to use updated wt for chemo dosing.  Sharyah Bostwick, Pharm.D., CPP 02/01/2024@9 :44 AM

## 2024-02-01 NOTE — Assessment & Plan Note (Signed)
 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 Dewey.  PET brain finding suggests recurrence -NeuroOnc recommend Radiation and Bevacizumab   Discussed with [name].   MRI brain showed good response.  Proceed with Bevacizumab  today,

## 2024-02-01 NOTE — Patient Instructions (Signed)
 [facility] - A DEPT OF [facility]  Discharge Instructions: Thank you for choosing [facility] to provide your oncology and hematology care.  If you have a lab appointment with the Cancer Center, please go directly to the Cancer Center and check in at the registration area.  Wear comfortable clothing and clothing appropriate for easy access to any Portacath or PICC line.   We strive to give you quality time with your provider. You may need to reschedule your appointment if you arrive late (15 or more minutes).  Arriving late affects you and other patients whose appointments are after yours.  Also, if you miss three or more appointments without notifying the office, you may be dismissed from the clinic at the provider's discretion.      For prescription refill requests, have your pharmacy contact our office and allow 72 hours for refills to be completed.    Today you received the following chemotherapy and/or immunotherapy agents- durvalumab , bevacizumab       To help prevent nausea and vomiting after your treatment, we encourage you to take your nausea medication as directed.  BELOW ARE SYMPTOMS THAT SHOULD BE REPORTED IMMEDIATELY: *FEVER GREATER THAN 100.4 F (38 C) OR HIGHER *CHILLS OR SWEATING *NAUSEA AND VOMITING THAT IS NOT CONTROLLED WITH YOUR NAUSEA MEDICATION *UNUSUAL SHORTNESS OF BREATH *UNUSUAL BRUISING OR BLEEDING *URINARY PROBLEMS (pain or burning when urinating, or frequent urination) *BOWEL PROBLEMS (unusual diarrhea, constipation, pain near the anus) TENDERNESS IN MOUTH AND THROAT WITH OR WITHOUT PRESENCE OF ULCERS (sore throat, sores in mouth, or a toothache) UNUSUAL RASH, SWELLING OR PAIN  UNUSUAL VAGINAL DISCHARGE OR ITCHING   Items with * indicate a potential emergency and should be followed up as soon as possible or go to the Emergency Department if any problems should occur.  Please show the CHEMOTHERAPY ALERT CARD or  IMMUNOTHERAPY ALERT CARD at check-in to the Emergency Department and triage nurse.  Should you have questions after your visit or need to cancel or reschedule your appointment, please contact [facility] - A DEPT OF [facility]  [phone] and follow the prompts.  Office hours are 8:00 a.m. to 4:30 p.m. Monday - Friday. Please note that voicemails left after 4:00 p.m. may not be returned until the following business day.  We are closed weekends and major holidays. You have access to a nurse at all times for urgent questions. Please call the main number to the clinic [phone] and follow the prompts.  For any non-urgent questions, you may also contact your provider using MyChart. We now offer e-Visits for anyone 58 and older to request care online for non-urgent symptoms. For details visit [email or web address].   Also download the MyChart app! Go to the app store, search MyChart, open the app, select [place], and log in with your MyChart username and password.

## 2024-02-01 NOTE — Assessment & Plan Note (Signed)
 Follow up with Dr. Mark Sil.  Continue keppra  and course of  dexamethasone  as advised by Dr. Mark Sil.

## 2024-02-01 NOTE — Assessment & Plan Note (Addendum)
 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  Labs are reviewed and discussed with patient. Proceed with Durvalumab   Plan to repeat CT scan in the next few weeks, after his nausea.vomiting symptoms are improved.

## 2024-02-02 ENCOUNTER — Encounter: Admitting: Physician Assistant

## 2024-02-02 DIAGNOSIS — L97812 Non-pressure chronic ulcer of other part of right lower leg with fat layer exposed: Secondary | ICD-10-CM | POA: Diagnosis not present

## 2024-02-02 DIAGNOSIS — L97112 Non-pressure chronic ulcer of right thigh with fat layer exposed: Secondary | ICD-10-CM | POA: Diagnosis not present

## 2024-02-02 DIAGNOSIS — L03115 Cellulitis of right lower limb: Secondary | ICD-10-CM | POA: Diagnosis not present

## 2024-02-02 DIAGNOSIS — L97512 Non-pressure chronic ulcer of other part of right foot with fat layer exposed: Secondary | ICD-10-CM | POA: Diagnosis not present

## 2024-02-05 ENCOUNTER — Other Ambulatory Visit: Payer: Self-pay

## 2024-02-08 ENCOUNTER — Ambulatory Visit: Admitting: Oncology

## 2024-02-08 ENCOUNTER — Other Ambulatory Visit

## 2024-02-08 ENCOUNTER — Inpatient Hospital Stay

## 2024-02-08 ENCOUNTER — Ambulatory Visit

## 2024-02-08 DIAGNOSIS — Z5112 Encounter for antineoplastic immunotherapy: Secondary | ICD-10-CM | POA: Diagnosis not present

## 2024-02-08 DIAGNOSIS — Z95828 Presence of other vascular implants and grafts: Secondary | ICD-10-CM

## 2024-02-08 DIAGNOSIS — C3491 Malignant neoplasm of unspecified part of right bronchus or lung: Secondary | ICD-10-CM

## 2024-02-08 LAB — BASIC METABOLIC PANEL - CANCER CENTER ONLY
Anion gap: 8 (ref 5–15)
BUN: 10 mg/dL (ref 8–23)
CO2: 23 mmol/L (ref 22–32)
Calcium: 9 mg/dL (ref 8.9–10.3)
Chloride: 106 mmol/L (ref 98–111)
Creatinine: 0.93 mg/dL (ref 0.61–1.24)
GFR, Estimated: >60 mL/min (ref >60)
Glucose, Bld: 132 mg/dL — ABNORMAL HIGH (ref 70–99)
Potassium: 4.2 mmol/L (ref 3.5–5.1)
Sodium: 137 mmol/L (ref 135–145)

## 2024-02-08 MED ORDER — HEPARIN SOD (PORK) LOCK FLUSH 100 UNIT/ML IV SOLN
500.0000 [IU] | Freq: Once | INTRAVENOUS | Status: AC
  Administered 2024-02-08: 500 [IU] via INTRAVENOUS
  Filled 2024-02-08: qty 5

## 2024-02-08 NOTE — Progress Notes (Signed)
 No IVF needed today.

## 2024-02-09 ENCOUNTER — Other Ambulatory Visit (HOSPITAL_COMMUNITY): Payer: Self-pay

## 2024-02-09 ENCOUNTER — Ambulatory Visit: Payer: Self-pay | Admitting: Family Medicine

## 2024-02-10 ENCOUNTER — Ambulatory Visit: Admitting: Physician Assistant

## 2024-02-15 ENCOUNTER — Inpatient Hospital Stay (HOSPITAL_BASED_OUTPATIENT_CLINIC_OR_DEPARTMENT_OTHER): Admitting: Oncology

## 2024-02-15 ENCOUNTER — Inpatient Hospital Stay

## 2024-02-15 ENCOUNTER — Other Ambulatory Visit

## 2024-02-15 ENCOUNTER — Encounter: Payer: Self-pay | Admitting: Oncology

## 2024-02-15 VITALS — BP 110/81 | HR 108 | Temp 97.5°F | Resp 18 | Wt 188.3 lb

## 2024-02-15 VITALS — BP 133/76 | HR 94 | Temp 96.0°F | Resp 19

## 2024-02-15 DIAGNOSIS — R112 Nausea with vomiting, unspecified: Secondary | ICD-10-CM | POA: Diagnosis not present

## 2024-02-15 DIAGNOSIS — C7931 Secondary malignant neoplasm of brain: Secondary | ICD-10-CM | POA: Diagnosis not present

## 2024-02-15 DIAGNOSIS — C3491 Malignant neoplasm of unspecified part of right bronchus or lung: Secondary | ICD-10-CM | POA: Diagnosis not present

## 2024-02-15 DIAGNOSIS — R569 Unspecified convulsions: Secondary | ICD-10-CM | POA: Diagnosis not present

## 2024-02-15 DIAGNOSIS — E032 Hypothyroidism due to medicaments and other exogenous substances: Secondary | ICD-10-CM

## 2024-02-15 DIAGNOSIS — Z5112 Encounter for antineoplastic immunotherapy: Secondary | ICD-10-CM | POA: Diagnosis not present

## 2024-02-15 LAB — CBC WITH DIFFERENTIAL/PLATELET
Abs Immature Granulocytes: 0.01 K/uL (ref 0.00–0.07)
Basophils Absolute: 0.1 K/uL (ref 0.0–0.1)
Basophils Relative: 1 %
Eosinophils Absolute: 0.3 K/uL (ref 0.0–0.5)
Eosinophils Relative: 5 %
HCT: 42.6 % (ref 39.0–52.0)
Hemoglobin: 13.5 g/dL (ref 13.0–17.0)
Immature Granulocytes: 0 %
Lymphocytes Relative: 17 %
Lymphs Abs: 1 K/uL (ref 0.7–4.0)
MCH: 27.5 pg (ref 26.0–34.0)
MCHC: 31.7 g/dL (ref 30.0–36.0)
MCV: 86.8 fL (ref 80.0–100.0)
Monocytes Absolute: 0.7 K/uL (ref 0.1–1.0)
Monocytes Relative: 12 %
Neutro Abs: 3.8 K/uL (ref 1.7–7.7)
Neutrophils Relative %: 65 %
Platelets: 286 K/uL (ref 150–400)
RBC: 4.91 MIL/uL (ref 4.22–5.81)
RDW: 15 % (ref 11.5–15.5)
WBC: 5.8 K/uL (ref 4.0–10.5)
nRBC: 0 % (ref 0.0–0.2)

## 2024-02-15 LAB — COMPREHENSIVE METABOLIC PANEL WITH GFR
ALT: 17 U/L (ref 0–44)
AST: 37 U/L (ref 15–41)
Albumin: 3.4 g/dL — ABNORMAL LOW (ref 3.5–5.0)
Alkaline Phosphatase: 73 U/L (ref 38–126)
Anion gap: 15 (ref 5–15)
BUN: 13 mg/dL (ref 8–23)
CO2: 21 mmol/L — ABNORMAL LOW (ref 22–32)
Calcium: 9 mg/dL (ref 8.9–10.3)
Chloride: 102 mmol/L (ref 98–111)
Creatinine, Ser: 0.98 mg/dL (ref 0.61–1.24)
GFR, Estimated: >60 mL/min (ref >60)
Glucose, Bld: 111 mg/dL — ABNORMAL HIGH (ref 70–99)
Potassium: 3.9 mmol/L (ref 3.5–5.1)
Sodium: 138 mmol/L (ref 135–145)
Total Bilirubin: 0.8 mg/dL (ref 0.0–1.2)
Total Protein: 6.7 g/dL (ref 6.5–8.1)

## 2024-02-15 MED ORDER — SODIUM CHLORIDE 0.9 % IV SOLN
10.0000 mg/kg | Freq: Once | INTRAVENOUS | Status: AC
  Administered 2024-02-15: 860 mg via INTRAVENOUS
  Filled 2024-02-15: qty 10

## 2024-02-15 MED ORDER — HEPARIN SOD (PORK) LOCK FLUSH 100 UNIT/ML IV SOLN
500.0000 [IU] | Freq: Once | INTRAVENOUS | Status: AC | PRN
  Administered 2024-02-15: 500 [IU]
  Filled 2024-02-15: qty 5

## 2024-02-15 MED ORDER — SODIUM CHLORIDE 0.9 % IV SOLN
Freq: Once | INTRAVENOUS | Status: AC
  Filled 2024-02-15: qty 250

## 2024-02-15 MED ORDER — SODIUM CHLORIDE 0.9 % IV SOLN
Freq: Once | INTRAVENOUS | Status: AC
Start: 2024-02-15 — End: 2024-02-15
  Filled 2024-02-15: qty 250

## 2024-02-15 NOTE — Progress Notes (Signed)
 Hematology/Oncology Progress note [phone] [phone]    CHIEF COMPLAINTS/REASON FOR VISIT:  Follow up for lung cancer  ASSESSMENT & PLAN:   Cancer Staging  Squamous cell carcinoma of right lung (HCC) Staging form: Lung, AJCC 8th Edition - Clinical stage from 09/20/2020: Stage IV (cT1b, cN2, cM1) - Signed by [name], MD on 12/13/2021   Squamous cell carcinoma of right lung (HCC) Durvalumab  was previously held due to pneumonitis.  Labs are reviewed and discussed with patient. Proceed with Durvalumab   Plan to repeat CT scan   Malignant neoplasm metastatic to brain [facility]) S/p SRS by Radonc Dr. Dewey.  PET brain finding suggests recurrence -NeuroOnc recommend Radiation and Bevacizumab   Discussed with [name].   MRI brain showed good response.  On Bevacizumab  monthly, with plan of 2 more doses.   Encounter for antineoplastic immunotherapy Durvalumab  treatment as planned.   Hypothyroidism due to medication Normal TSH continue levothyroxine  125 mcg daily    Nausea with vomiting Improved. I will hold off repeat brain MRI imaging.   Seizure (HCC) Follow up with [name].  Continue keppra  and course of  dexamethasone  as advised by [name].       Follow up  1 week lab +/- IVF 2 weeks lab MD Durvalumab  All questions were answered. The patient knows to call the clinic with any problems, questions or concerns.  Call Babara, MD, PhD [facility] Hematology Oncology 02/15/2024       HISTORY OF PRESENTING ILLNESS:  Joshua [name] is a 68 y.o. male who has above history reviewed by me today presents for follow up visit for Stage IV lung squamous cell carcinoma. Oncology History  Squamous cell carcinoma of right lung (HCC)  09/18/2020 Imaging   PET scan showed right upper lobe nodule maximal SUV 11.5.  Hypermetabolic cluster right paratracheal adenopathy with maximum SUV up to 12.  No findings of metastatic disease to the neck/abdomen/pelvis  or skeleton.   09/20/2020 Initial Diagnosis   Squamous cell carcinoma of right lung (HCC) NGS showed PD-L1 TPS 95%, LRP1B S997fs, PIK3CA E545K, RB1 c1422-2A>T, RIT1 T83_ A84del, STK11 P281fs, TPS53 R248W, TMB 19.8mut/mb, MS stable.    09/20/2020 Cancer Staging   Staging form: Lung, AJCC 8th Edition - Clinical stage from 09/20/2020: Stage IV (cT1b, cN2, cM1) - Signed by [name], MD on 12/13/2021   10/03/2020 Imaging   MRI brain with and without contrast showed no evidence of intracranial metastatic disease. Well-defined round lesion within the left parotid tail measuring approximately 2.3 x 1.8 x 1.8 cm   10/09/2020 - 11/27/2020 Radiation Therapy   concurrent chemoradiation   10/10/2020 - 10/31/2020 Chemotherapy   10/10/2020 concurrent chemoradiation.  carboplatin  AUC of 2 and Taxol  45 mg/m2 x 4.  Additional chemotherapy was held due to thrombocytopenia, neutropenia       12/31/2020 - 03/21/2022 Chemotherapy   Maintenance  Durvalumab  q14d      12/31/2020 -  Chemotherapy   Patient is on Treatment Plan : LUNG Durvalumab  (10) q14d     03/28/2021 Imaging   CT without contrast showed new linear right perihilar consolidation with associated traction bronchiectasis.  Likely postradiation changes.  Additional new peribronchial vascular/nodular groundglass opacities with areas of consolidation or seen primarily in the right upper and lower lobes, more distant from expected radiation fields. 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  Per my discussion with pulmonology Dr. 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     05/09/2021 Imaging   Repeat CT chest without contrast showed evolving radiation changes in the right perihilar region likely accounting for interval increase to thickening of the posterior wall of the right upper lobe bronchus.  The more peripheral right lung  groundglass opacities seen on most recent study have generally improved with exception of one peripherally right upper lobe which appears new.  These are likely inflammatory.  No findings highly suspicious for local recurrence.   05/30/2021 - 01/10/2022 Chemotherapy   Durvalumab  q14d     10/07/2021 Imaging   MRI brain with and without contrast showed 2 contrast-enhancing lesions, most consistent with metastatic disease.  Largest lesion is in the left frontal lobe and measures up to 1.3 cm with a large amount of surrounding edema and 9 mm rightward midline shift.  The other enhancing lesion was 4 mm in the right occipital lobe with a small amount of surrounding edema.  No acute or chronic hemorrhage.   10/07/2021 Progression   Headache after colonoscopy, progressive difficulty using her right arm.  Progressively worsening of speech. Stage IV lung cancer with brain metastasis.  started on Dexamethasone .  He was seen by neurosurgery and radiation oncology.  Recommendation is to proceed with SRS.  Patient was discharged with dexamethasone    10/16/2021 Imaging   PET scan showed postradiation changes in the right supra hilar lung without evidence of local recurrence.  No evidence of lung cancer recurrence or metastatic disease.  Single focus of intense metabolic activity associated with posterior left 11th rib likely secondary to recent fall/posttraumatic metabolic changes.  Frontal  uptake in the left frontal lobe corresponding to the known metastatic lesion in the brain   10/29/2021 -  Radiation Therapy   Brain Radiation [facility]   12/10/2021 Imaging   Brain MRI: Continued interval decrease in size of a left frontal lobe metastasis, now measuring 10 mm. Moderate surrounding edema has also decreased.  3-4 mm metastasis within the right occipital lobe, not significantly changed in size. There is now a small focus of central non enhancement within this lesion. Increased edema. 3 mm enhancing metastasis within the  high left parietal lobe. In retrospect, this was likely present as a subtle punctate focus of enhancement on the prior brain MRI of 10/22/2021.   01/18/2022 Imaging   CT chest abdomen pelvis w contrast 1. Stable post treatment changes in the right upper lobe medially with dense radiation fibrosis but no findings to suggest local recurrent tumor. 2. Significant progression of ill-defined ground-glass opacity, interstitial thickening and airspace nodularity in the right upper lobe and upper aspect of the right lower lobe. Findings could reflect an inflammatory or atypical infectious process, drug induced pneumonitis or interstitial spread of tumor.3. No mediastinal or hilar mass or adenopathy. 4. No findings for abdominal/pelvic metastatic disease or osseous metastatic disease.   02/19/2022 Imaging   MRI brain w wo contrast showed Three metastatic deposits in the brain appear smaller. Decreased edema in the left parietal lobe. No new lesions    04/04/2022 Imaging   CT chest w contrast 1. Stable radiation changes in the right perihilar region without evidence of local recurrence. 2. Interval improvement in the patchy ground-glass opacities previously demonstrated in both lungs, most consistent with a resolving infectious or inflammatory process (including immunotherapy-related pneumonitis). 3. No evidence of metastatic disease.   05/14/2022 Miscellaneous   He had a mechanical fall and fractured his left elbow. xrays showed a left elbow fracture  05/26/22 seen by orthopedic surgeon, cast applied.    05/27/2022 Imaging   MRI brain w wo contrast 1. All three known brain metastases have enlarged since August, with increasing regional edema. And there is a small new left anterior cerebellar 4-5 mm metastasis (was questionably punctate on the May MRI). This constellation could reflect a combination of True Progression (Left Cerebellar lesion) and pseudoprogression/radionecrosis (3 existing mets).    2.  No significant intracranial mass effect. 3. Underlying moderate cerebral white matter signal changes.   05/30/2022 Imaging   CT chest abdomen pelvis w contrast  1. 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 There is a stable small right pleural effusion, but no definitive findings to suggest residual or recurrent disease on today's examination. 2. No signs of metastatic disease in the abdomen or pelvis.  3. Aortic atherosclerosis, in addition to two-vessel coronary artery disease. Please note that although the presence of coronary artery calcium  documents the presence of coronary artery disease, the severity of this disease and any potential stenosis cannot be assessed on this non-gated CT examination. Assessment for potential risk factor modification, dietary therapy or pharmacologic therapy may be warranted, if clinically indicated. 4. Hepatic steatosis. 5. Colonic diverticulosis without evidence of acute diverticulitis at this time. 6. Incompletely imaged but apparently occluded vascular graft in the superior aspect of the right anterior thigh. Referral to vascular specialist for further clinical evaluation should be considered if clinically appropriate. 7. Additional incidental findings, as above.   07/22/2022 Imaging   MRI brain  1. No new or enlarging lesions. No intracranial disease progression. 2. Decreased size of treated lesions of the left frontoparietal junction and left postcentral gyrus. 3. Unchanged lesions in the left cerebellum and right occipital lobe.   10/15/2022 Imaging   MRI brain w wo contrast showed Interval increase in size of multiple intraparenchymal lesions with worsened surrounding vasogenic edema. 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 No new lesions   11/05/2022  Imaging   MRI brain w wo contrast  1. Likely slight increase and right occipital and left cerebellar lesions. Otherwise, unchanged metastases, as above. No new lesions identified. 2. Partially imaged large probable lymph node in the left upper neck, apparent on prior PET CT.   11/13/2022 -  Radiation Therapy   Each of the sites were treated with SRS/SBRT/SRT-IMRT technique to 20 Gy in 1 fraction to each of the following sites: PTV_2_LCerebel_84mm PTV_3_RCerebel_78mm PTV_4_ROccip_79mm PTV_5_LParietal_53mm   11/27/2022 Imaging   CT chest abdomen pelvis w contrast showed  Improving bilateral scattered interstitial and ground-glass opacities. Small amount of residual areas in the extreme left lower lobe inferiorly. This specific area is slightly increased from previous but overall the amount of opacities is significantly improved. Recommend continued follow-up. No developing new mass lesion, fluid collection or lymph node enlargement. Stable posttreatment changes right perihilar in the lung. Fatty liver infiltration. Colonic diverticulosis. Improved tiny right pleural effusion   02/05/2023 Imaging   MRI brain with and without contrast Interval decrease in all of the metastatic lesions, detailed above.No new lesions identified.    Imaging   CT chest abdomen pelvis w contrast showed  1. 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 2. Trace right pleural fluid or thickening, new or minimally increased. 3. Incidental findings, including: Aortic atherosclerosis (ICD10-I70.0), coronary artery atherosclerosis and emphysema (ICD10-J43.9). Hepatic steatosis.  06/25/2023 Imaging   CT chest abdomen pelvis w contrast showed 1. Unchanged post treatment/post radiation appearance of the right lung, with perihilar and suprahilar paramedian fibrosis and consolidation. 2. No evidence of lymphadenopathy or metastatic disease in the chest,  abdomen, or pelvis. 3. Hepatic steatosis. 4. Coronary artery disease.   Aortic Atherosclerosis (ICD10-I70.0) and Emphysema (ICD10-J43.9).   08/26/2023 [facility] Admission   hospitalization due to was observed speech and jerking movements on right side despite being on dexamethasone . Continuous EEG suggest cortical dysfunction arising from left temporal region.  No seizure or elective form discharges were seen. Patient was discharged on dexamethasone  4 mg, Keppra , lamotrigine .    11/04/2023 -  Chemotherapy   Add Bevacizumab  5mg /kg to Durvalumab .     11/17/2023 - 11/27/2023 Radiation Therapy   Patient received radiation to brain for treatment of recurrence.    12/17/2023 - 12/22/2023 patient was recently hospitalized due to right lower extremity cellulitis.  Patient was treated with vancomycin , cefepime .Noted some improvement.  Patient reports that he was discharged on oral doxycycline  and Augmentin  to complete course.  Patient noticed a worsening of symptoms soon after finishing antibiotics. 12/25/2023 ER visit, was prescribed a course of Ceftin  and he has finishes the course.  He has experienced decreased appetite, loss weight since started on Ceftin .  Right ankle and foot redness have improved. Still has pain of right big toe.   INTERVAL HISTORY Joshua [name] is a 68 y.o. male who has above history reviewed by me today presents for follow up visit for right lung RCC.  chronic SOB,not worse.  Right lower extremity wound- he follows up with wound care Nausea has improved.    Review of Systems  Constitutional:  Positive for fatigue. Negative for appetite change, chills, fever and unexpected weight change.  HENT:   Negative for hearing loss and voice change.   Eyes:  Negative for eye problems and icterus.  Respiratory:  Negative for chest tightness, cough and shortness of breath.   Cardiovascular:  Positive for leg swelling. Negative for chest pain.  Gastrointestinal:  Negative for  abdominal distention, abdominal pain, nausea and vomiting.  Endocrine: Negative for hot flashes.  Genitourinary:  Negative for difficulty urinating, dysuria and frequency.   Musculoskeletal:  Positive for gait problem. Negative for arthralgias.       Right big toe wound  Skin:  Negative for itching and rash.  Neurological:  Positive for gait problem. Negative for extremity weakness, headaches, numbness and speech difficulty.  Hematological:  Negative for adenopathy. Bruises/bleeds easily.  Psychiatric/Behavioral:  Negative for confusion.       MEDICAL HISTORY:  Past Medical History:  Diagnosis Date   Allergy    Cancer (HCC)    lung   COPD (chronic obstructive pulmonary disease) (HCC)    Dyspnea    former smoker. 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   Malignant neoplasm metastatic to brain [facility])    Nausea with vomiting 02/01/2024   Peripheral vascular disease (HCC)    Pre-diabetes    Sleep apnea     SURGICAL HISTORY: Past Surgical History:  Procedure Laterality Date   ANTERIOR CERVICAL DECOMP/DISCECTOMY FUSION N/A 03/14/2020   Procedure: ANTERIOR CERVICAL DECOMPRESSION/DISCECTOMY FUSION 3 LEVELS C4-7;  Surgeon: Clois Fret, MD;  Location: [facility] ORS;  Service: Neurosurgery;  Laterality: N/A;   BACK SURGERY  2011   CENTRAL LINE INSERTION Right 09/10/2016   Procedure: CENTRAL LINE INSERTION;  Surgeon: Jama Cordella MATSU, MD;  Location: [facility] ORS;  Service: Vascular;  Laterality: Right;   CERVICAL FUSION     C4-c7 on Mar 14, 2020   COLONOSCOPY  2013 ?   COLONOSCOPY N/A 09/09/2021   Procedure: COLONOSCOPY;  Surgeon: Onita Elspeth Sharper, DO;  Location: [facility] ENDOSCOPY;  Service: Gastroenterology;  Laterality: N/A;   CORONARY ANGIOPLASTY     ESOPHAGOGASTRODUODENOSCOPY N/A 09/09/2021   Procedure: ESOPHAGOGASTRODUODENOSCOPY (EGD);  Surgeon: Onita Elspeth Sharper, DO;  Location: [facility] ENDOSCOPY;  Service:  Gastroenterology;  Laterality: N/A;   FEMORAL-POPLITEAL BYPASS GRAFT Right 09/10/2016   Procedure: BYPASS GRAFT FEMORAL-POPLITEAL ARTERY ( BELOW KNEE );  Surgeon: Jama Cordella MATSU, MD;  Location: [facility] ORS;  Service: Vascular;  Laterality: Right;   FEMORAL-TIBIAL BYPASS GRAFT Right 03/23/2020   Procedure: BYPASS GRAFT RIGHT FEMORAL- DISTAL TIBIAL ARTERY WITH DISTAL FLOW GRAFT;  Surgeon: Jama Cordella MATSU, MD;  Location: [facility] ORS;  Service: Vascular;  Laterality: Right;   LEFT HEART CATH AND CORONARY ANGIOGRAPHY Left 05/16/2021   Procedure: LEFT HEART CATH AND CORONARY ANGIOGRAPHY;  Surgeon: Lawyer Bernardino Cough, MD;  Location: [facility] INVASIVE CV LAB;  Service: Cardiovascular;  Laterality: Left;   LOWER EXTREMITY ANGIOGRAPHY Right 11/18/2016   Procedure: Lower Extremity Angiography;  Surgeon: Cordella MATSU Jama, MD;  Location: [facility] INVASIVE CV LAB;  Service: Cardiovascular;  Laterality: Right;   LOWER EXTREMITY ANGIOGRAPHY Right 12/09/2016   Procedure: Lower Extremity Angiography;  Surgeon: Jama Cordella MATSU, MD;  Location: [facility] INVASIVE CV LAB;  Service: Cardiovascular;  Laterality: Right;   LOWER EXTREMITY ANGIOGRAPHY Right 11/15/2019   Procedure: LOWER EXTREMITY ANGIOGRAPHY;  Surgeon: Jama Cordella MATSU, MD;  Location: [facility] INVASIVE CV LAB;  Service: Cardiovascular;  Laterality: Right;   LOWER EXTREMITY ANGIOGRAPHY Right 12/01/2019   Procedure: Lower Extremity Angiography;  Surgeon: Marea Selinda RAMAN, MD;  Location: [facility] INVASIVE CV LAB;  Service: Cardiovascular;  Laterality: Right;   LOWER EXTREMITY ANGIOGRAPHY Right 12/02/2019   Procedure: LOWER EXTREMITY ANGIOGRAPHY;  Surgeon: Jama Cordella MATSU, MD;  Location: [facility] INVASIVE CV LAB;  Service: Cardiovascular;  Laterality: Right;   LOWER EXTREMITY ANGIOGRAPHY Right 03/23/2020   Procedure: Lower Extremity Angiography;  Surgeon: Jama Cordella MATSU, MD;  Location: [facility] INVASIVE CV LAB;  Service: Cardiovascular;  Laterality: Right;   LOWER EXTREMITY ANGIOGRAPHY  Right 09/25/2021   Procedure: Lower Extremity Angiography;  Surgeon: Jama Cordella MATSU, MD;  Location: [facility] INVASIVE CV LAB;  Service: Cardiovascular;  Laterality: Right;   LOWER EXTREMITY ANGIOGRAPHY Right 09/26/2021   Procedure: Lower Extremity Angiography;  Surgeon: Marea Selinda RAMAN, MD;  Location: [facility] INVASIVE CV LAB;  Service: Cardiovascular;  Laterality: Right;   LOWER EXTREMITY ANGIOGRAPHY Right 04/08/2022   Procedure: Lower Extremity Angiography;  Surgeon: Jama Cordella MATSU, MD;  Location: [facility] INVASIVE CV LAB;  Service: Cardiovascular;  Laterality: Right;   LOWER EXTREMITY INTERVENTION  11/18/2016   Procedure: Lower Extremity Intervention;  Surgeon: Cordella MATSU Jama, MD;  Location: [facility] INVASIVE CV LAB;  Service: Cardiovascular;;   PERIPHERAL VASCULAR CATHETERIZATION Right 01/02/2015   Procedure: Lower Extremity Angiography;  Surgeon: Cordella MATSU Jama, MD;  Location: [facility] INVASIVE CV LAB;  Service: Cardiovascular;  Laterality: Right;   PERIPHERAL VASCULAR CATHETERIZATION Right 06/18/2015   Procedure: Lower Extremity Angiography;  Surgeon: Selinda RAMAN Marea, MD;  Location: [facility] INVASIVE CV LAB;  Service: Cardiovascular;  Laterality: Right;   PERIPHERAL VASCULAR CATHETERIZATION  06/18/2015   Procedure: Lower Extremity Intervention;  Surgeon: Selinda RAMAN Marea, MD;  Location: [facility] INVASIVE CV LAB;  Service: Cardiovascular;;  PERIPHERAL VASCULAR CATHETERIZATION N/A 10/09/2015   Procedure: Abdominal Aortogram w/Lower Extremity;  Surgeon: Cordella KANDICE Shawl, MD;  Location: [facility] INVASIVE CV LAB;  Service: Cardiovascular;  Laterality: N/A;   PERIPHERAL VASCULAR CATHETERIZATION  10/09/2015   Procedure: Lower Extremity Intervention;  Surgeon: Cordella KANDICE Shawl, MD;  Location: [facility] INVASIVE CV LAB;  Service: Cardiovascular;;   PERIPHERAL VASCULAR CATHETERIZATION Right 10/10/2015   Procedure: Lower Extremity Angiography;  Surgeon: Selinda GORMAN Gu, MD;  Location: [facility] INVASIVE CV LAB;  Service: Cardiovascular;  Laterality:  Right;   PERIPHERAL VASCULAR CATHETERIZATION Right 10/10/2015   Procedure: Lower Extremity Intervention;  Surgeon: Selinda GORMAN Gu, MD;  Location: [facility] INVASIVE CV LAB;  Service: Cardiovascular;  Laterality: Right;   PERIPHERAL VASCULAR CATHETERIZATION Right 12/03/2015   Procedure: Lower Extremity Angiography;  Surgeon: Selinda GORMAN Gu, MD;  Location: [facility] INVASIVE CV LAB;  Service: Cardiovascular;  Laterality: Right;   PERIPHERAL VASCULAR CATHETERIZATION Right 12/04/2015   Procedure: Lower Extremity Angiography;  Surgeon: Selinda GORMAN Gu, MD;  Location: [facility] INVASIVE CV LAB;  Service: Cardiovascular;  Laterality: Right;   PERIPHERAL VASCULAR CATHETERIZATION  12/04/2015   Procedure: Lower Extremity Intervention;  Surgeon: Selinda GORMAN Gu, MD;  Location: [facility] INVASIVE CV LAB;  Service: Cardiovascular;;   PERIPHERAL VASCULAR CATHETERIZATION Right 06/30/2016   Procedure: Lower Extremity Angiography;  Surgeon: Selinda GORMAN Gu, MD;  Location: [facility] INVASIVE CV LAB;  Service: Cardiovascular;  Laterality: Right;   PERIPHERAL VASCULAR CATHETERIZATION Right 06/25/2016   Procedure: Lower Extremity Angiography;  Surgeon: Cordella KANDICE Shawl, MD;  Location: [facility] INVASIVE CV LAB;  Service: Cardiovascular;  Laterality: Right;   PERIPHERAL VASCULAR CATHETERIZATION Right 07/01/2016   Procedure: Lower Extremity Angiography;  Surgeon: Cordella KANDICE Shawl, MD;  Location: [facility] INVASIVE CV LAB;  Service: Cardiovascular;  Laterality: Right;   PERIPHERAL VASCULAR CATHETERIZATION  07/01/2016   Procedure: Lower Extremity Intervention;  Surgeon: Cordella KANDICE Shawl, MD;  Location: [facility] INVASIVE CV LAB;  Service: Cardiovascular;;   PERIPHERAL VASCULAR CATHETERIZATION Right 08/01/2016   Procedure: Lower Extremity Angiography;  Surgeon: Cordella KANDICE Shawl, MD;  Location: [facility] INVASIVE CV LAB;  Service: Cardiovascular;  Laterality: Right;   PORTA CATH INSERTION N/A 10/05/2020   Procedure: PORTA CATH INSERTION;  Surgeon: Shawl Cordella KANDICE, MD;  Location: [facility]  INVASIVE CV LAB;  Service: Cardiovascular;  Laterality: N/A;   stent placement in right leg Right    VIDEO BRONCHOSCOPY N/A 09/14/2020   Procedure: VIDEO BRONCHOSCOPY WITH FLUORO;  Surgeon: Tamea Dedra CROME, MD;  Location: [facility] ORS;  Service: Cardiopulmonary;  Laterality: N/A;   VIDEO BRONCHOSCOPY WITH ENDOBRONCHIAL ULTRASOUND N/A 09/14/2020   Procedure: VIDEO BRONCHOSCOPY WITH ENDOBRONCHIAL ULTRASOUND;  Surgeon: Tamea Dedra CROME, MD;  Location: [facility] ORS;  Service: Cardiopulmonary;  Laterality: N/A;    SOCIAL HISTORY: Social History   Socioeconomic History   Marital status: Single    Spouse name: Not on file   Number of children: Not on file   Years of education: Not on file   Highest education level: Not on file  Occupational History   Occupation: unemployed  Tobacco Use   Smoking status: Former    Current packs/day: 0.00    Average packs/day: 2.0 packs/day for 38.0 years (76.0 ttl pk-yrs)    Types: Cigarettes    Start date: 12/19/1976    Quit date: 12/20/2014    Years since quitting: 9.1   Smokeless tobacco: Never   Tobacco comments:    quit smoking in 2017 most smoked 2   Vaping Use   Vaping status: Former  Substance and Sexual Activity   Alcohol  use: Not Currently   Drug use: No   Sexual activity: Yes  Other Topics Concern   Not on file  Social History Narrative   Not on file   Social Drivers of Health   Financial Resource Strain: Medium Risk (02/05/2024)   Overall Financial Resource Strain (CARDIA)    Difficulty of Paying Living Expenses: Somewhat hard  Food Insecurity: No Food Insecurity (02/05/2024)   Hunger Vital Sign    Worried About Running Out of Food in the Last Year: Never true    Ran Out of Food in the Last Year: Never true  Transportation Needs: No Transportation Needs (02/05/2024)   PRAPARE - Administrator, Civil Service (Medical): No    Lack of Transportation (Non-Medical): No  Physical Activity: Inactive (02/05/2024)   Exercise Vital Sign     Days of Exercise per Week: 1 day    Minutes of Exercise per Session: 0 min  Stress: No Stress Concern Present (02/05/2024)   [employer] of Occupational Health - Occupational Stress Questionnaire    Feeling of Stress: Only a little  Social Connections: Socially Isolated (02/05/2024)   Social Connection and Isolation Panel    Frequency of Communication with Friends and Family: Three times a week    Frequency of Social Gatherings with Friends and Family: More than three times a week    Attends Religious Services: Never    Database administrator or Organizations: No    Attends Engineer, structural: Not on file    Marital Status: Never married  Intimate Partner Violence: Not At Risk (12/18/2023)   Humiliation, Afraid, Rape, and Kick questionnaire    Fear of Current or Ex-Partner: No    Emotionally Abused: No    Physically Abused: No    Sexually Abused: No    FAMILY HISTORY: Family History  Problem Relation Age of Onset   Diabetes Mother    Hypertension Mother    Heart murmur Mother    Leukemia Mother    Throat cancer Maternal Grandmother    Colon cancer Neg Hx    Breast cancer Neg Hx     ALLERGIES:  has no known allergies.  MEDICATIONS:  Current Outpatient Medications  Medication Sig Dispense Refill   aspirin  EC 81 MG tablet Take 81 mg by mouth daily. Swallow whole.     clopidogrel  (PLAVIX ) 75 MG tablet TAKE 1 TABLET BY MOUTH DAILY 100 tablet 2   levETIRAcetam  (KEPPRA ) 1000 MG tablet Take 1 tablet (1,000 mg total) by mouth 2 (two) times daily. 60 tablet 2   levothyroxine  (SYNTHROID ) 125 MCG tablet Take 1 tablet (125 mcg total) by mouth daily before breakfast. 90 tablet 0   mupirocin  ointment (BACTROBAN ) 2 % Apply 1 Application topically daily. 22 g 5   ondansetron  (ZOFRAN ) 8 MG tablet Take 1 tablet (8 mg total) by mouth every 8 (eight) hours as needed for nausea or vomiting. 20 tablet 0   potassium chloride  SA (KLOR-CON  M) 20 MEQ tablet Take 1 tablet (20 mEq  total) by mouth daily. 30 tablet 0   prochlorperazine  (COMPAZINE ) 10 MG tablet Take 1 tablet (10 mg total) by mouth every 6 (six) hours as needed for nausea or vomiting. 30 tablet 0   rosuvastatin  (CRESTOR ) 40 MG tablet Take 1 tablet (40 mg total) by mouth daily. 90 tablet 3   No current facility-administered medications for this visit.   Facility-Administered Medications Ordered in Other Visits  Medication  Dose Route Frequency Provider Last Rate Last Admin   albuterol  (PROVENTIL ) (2.5 MG/3ML) 0.083% nebulizer solution 2.5 mg  2.5 mg Nebulization Once Tamea Dedra CROME, MD       heparin  lock flush 100 unit/mL  500 Units Intracatheter Once PRN [name], MD         PHYSICAL EXAMINATION: ECOG PERFORMANCE STATUS: 1 - Symptomatic but completely ambulatory Vitals:   02/15/24 0849 02/15/24 0928  BP: 110/81   Pulse: (!) 115 (!) 108  Resp: 18   Temp: (!) 97.5 F (36.4 C)   SpO2: 99%     Filed Weights   02/15/24 0849  Weight: 188 lb 4.8 oz (85.4 kg)     Physical Exam Constitutional:      General: He is not in acute distress.    Appearance: He is obese.  HENT:     Head: Normocephalic and atraumatic.  Eyes:     General: No scleral icterus. Cardiovascular:     Rate and Rhythm: Normal rate and regular rhythm.  Pulmonary:     Effort: Pulmonary effort is normal. No respiratory distress.     Breath sounds: Normal breath sounds. No wheezing.  Abdominal:     General: There is no distension.     Palpations: Abdomen is soft.     Tenderness: There is no abdominal tenderness.  Musculoskeletal:     Cervical back: Normal range of motion and neck supple.     Comments: Right lower extremity wrapped.   Skin:    General: Skin is warm and dry.     Findings: Bruising present. No erythema.  Neurological:     Mental Status: He is alert and oriented to person, place, and time. Mental status is at baseline.     Cranial Nerves: No cranial nerve deficit.     Motor: No abnormal muscle tone.   Psychiatric:        Mood and Affect: Mood and affect normal.         LABORATORY DATA:  I have reviewed the data as listed    Latest Ref Rng & Units 02/15/2024    8:33 AM 02/01/2024    8:23 AM 01/25/2024    1:20 PM  CBC  WBC 4.0 - 10.5 K/uL 5.8  5.7  7.2   Hemoglobin 13.0 - 17.0 g/dL 86.4  86.9  86.5   Hematocrit 39.0 - 52.0 % 42.6  41.5  42.4   Platelets 150 - 400 K/uL 286  249  294       Latest Ref Rng & Units 02/15/2024    8:33 AM 02/08/2024    1:09 PM 02/01/2024    8:23 AM  CMP  Glucose 70 - 99 mg/dL 888  867  882   BUN 8 - 23 mg/dL 13  10  7    Creatinine 0.61 - 1.24 mg/dL 9.01  9.06  8.92   Sodium 135 - 145 mmol/L 138  137  140   Potassium 3.5 - 5.1 mmol/L 3.9  4.2  2.9   Chloride 98 - 111 mmol/L 102  106  102   CO2 22 - 32 mmol/L 21  23  25    Calcium  8.9 - 10.3 mg/dL 9.0  9.0  8.9   Total Protein 6.5 - 8.1 g/dL 6.7   6.6   Total Bilirubin 0.0 - 1.2 mg/dL 0.8   1.1   Alkaline Phos 38 - 126 U/L 73   82   AST 15 - 41 U/L 37   34  ALT 0 - 44 U/L 17   16      RADIOGRAPHIC STUDIES: I have personally reviewed the radiological images as listed and agreed with the findings in the report. 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 EXAM: MRI HEAD WITHOUT AND WITH CONTRAST TECHNIQUE: Multiplanar, multiecho pulse sequences of the brain and surrounding structures were obtained without and with intravenous contrast. CONTRAST:  9mL GADAVIST  GADOBUTROL  1 MMOL/ML IV SOLN COMPARISON:  MRI of the brain dated November 06, 2023. FINDINGS: Brain: A centrally necrotic, peripherally enhancing lesion within the left parietal lobe has decreased in size in the interim from approximately 3.5 x 2.9 x 2.4 cm to approximately 2.7 x 2.0 x 1.8 cm. A lesion within the left frontal operculum as marginally decreased in size in the interim from 1.4 x 1.4 x 1.6 cm to approximately 1.2 x 1.2 x 1.3 cm. A and nodule  previously noted within the right occipital lobe has also marginally in decrease in size in the interim from 8 mm in diameter to 7 mm. The linear area of enhancement previously noted along the left inferior parietal lobe appears represent a vessel. There are no new or enlarging lesions present. There is moderate periventricular and deep cerebral white matter disease. There is gliosis also noted adjacent to the necrotic lesion within the left parietal lobe. Vascular: Normal vascular flow voids. Skull and upper cervical spine: No osseous lesions are evident. Normal bone marrow signal. Sinuses/Orbits: Mild mucosal disease within the right maxillary sinus. The orbits are unremarkable. Other: None. IMPRESSION: 1. Interval improvement of metastatic disease within the left parietal, left frontal and right occipital lobes. Electronically Signed   By: Evalene Coho M.D.   On: 01/04/2024 13:17   DG Foot 2 Views Right Result Date: 12/25/2023 CLINICAL DATA:  Foot wound EXAM: RIGHT FOOT - 2 VIEW COMPARISON:  None Available. FINDINGS: There is no evidence of fracture or dislocation. There is no evidence of arthropathy or other focal bone abnormality. Soft tissues soft tissue swelling of the dorsum IMPRESSION: Soft tissue swelling of the dorsum of the foot. Electronically Signed   By: Franky Chard M.D.   On: 12/25/2023 13:05   DG Foot 2 Views Right Result Date: 12/17/2023 CLINICAL DATA:  Cellulitis. Right foot pain for 1 month. Wound to big toe. Redness and swelling. EXAM: RIGHT FOOT - 2 VIEW COMPARISON:  None Available. FINDINGS: Generalized soft tissue edema. Site of wound is not well delineated by radiograph. No erosive or bony destructive change. No fracture. Mild degenerative change of the first metatarsal phalangeal joint. No soft tissue gas or radiopaque foreign body. Small plantar calcaneal spur. IMPRESSION: 1. Generalized soft tissue edema. No radiographic evidence of osteomyelitis. 2. Mild degenerative change  of the first metatarsophalangeal joint. Electronically Signed   By: Andrea Gasman M.D.   On: 12/17/2023 23:29   DG Chest 2 View Result Date: 12/17/2023 CLINICAL DATA:  Suspected sepsis EXAM: CHEST - 2 VIEW COMPARISON:  02/10/2022 FINDINGS: Right Port-A-Cath remains in place, unchanged. Heart mediastinal contours within normal limits. No acute confluent airspace opacities or effusions. Post treatment scarring noted in the right suprahilar region, unchanged. No acute bony abnormality. IMPRESSION: No active cardiopulmonary disease. Electronically Signed   By: Franky Crease M.D.   On: 12/17/2023 21:05   US  Venous Img Lower Unilateral Right (DVT) Result Date: 11/18/2023 CLINICAL DATA:  Right leg swelling EXAM: RIGHT LOWER EXTREMITY VENOUS DOPPLER  ULTRASOUND TECHNIQUE: Gray-scale sonography with compression, as well as color and duplex ultrasound, were performed to evaluate the deep venous system(s) from the level of the common femoral vein through the popliteal and proximal calf veins. COMPARISON:  None Available. FINDINGS: VENOUS Normal compressibility of the common femoral, superficial femoral, and popliteal veins, as well as the visualized calf veins. Visualized portions of profunda femoral vein and great saphenous vein unremarkable. No filling defects to suggest DVT on grayscale or color Doppler imaging. Doppler waveforms show normal direction of venous flow, normal respiratory plasticity and response to augmentation. Limited views of the contralateral common femoral vein are unremarkable. 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 Limitations: none IMPRESSION: 1. No findings of right lower extremity DVT. 2. 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 If workup of the right lower extremity arterial system is indicated, a CT angiogram runoff study could be performed.  Electronically Signed   By: Ryan Salvage M.D.   On: 11/18/2023 17:25

## 2024-02-15 NOTE — Assessment & Plan Note (Signed)
Durvalumab treatment as planned.  

## 2024-02-15 NOTE — Assessment & Plan Note (Addendum)
 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 Dewey.  PET brain finding suggests recurrence -NeuroOnc recommend Radiation and Bevacizumab   Discussed with [name].   MRI brain showed good response.  On Bevacizumab  monthly, with plan of 2 more doses.

## 2024-02-15 NOTE — Assessment & Plan Note (Addendum)
 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  Labs are reviewed and discussed with patient. Proceed with Durvalumab   Plan to repeat CT scan

## 2024-02-15 NOTE — Assessment & Plan Note (Signed)
 Normal TSH continue levothyroxine  125 mcg daily

## 2024-02-15 NOTE — Assessment & Plan Note (Signed)
 Improved. I will hold off repeat brain MRI imaging.

## 2024-02-15 NOTE — Progress Notes (Signed)
 Ok to tx with Hr 108  Per dr Babara

## 2024-02-15 NOTE — Assessment & Plan Note (Signed)
 Follow up with Dr. Mark Sil.  Continue keppra  and course of  dexamethasone  as advised by Dr. Mark Sil.

## 2024-02-17 ENCOUNTER — Encounter: Admitting: Physician Assistant

## 2024-02-17 DIAGNOSIS — L97112 Non-pressure chronic ulcer of right thigh with fat layer exposed: Secondary | ICD-10-CM | POA: Diagnosis not present

## 2024-02-17 DIAGNOSIS — L97512 Non-pressure chronic ulcer of other part of right foot with fat layer exposed: Secondary | ICD-10-CM | POA: Diagnosis not present

## 2024-02-17 DIAGNOSIS — L97812 Non-pressure chronic ulcer of other part of right lower leg with fat layer exposed: Secondary | ICD-10-CM | POA: Diagnosis not present

## 2024-02-17 DIAGNOSIS — I7389 Other specified peripheral vascular diseases: Secondary | ICD-10-CM | POA: Diagnosis not present

## 2024-02-19 ENCOUNTER — Other Ambulatory Visit: Payer: Self-pay

## 2024-02-23 ENCOUNTER — Ambulatory Visit
Admission: RE | Admit: 2024-02-23 | Discharge: 2024-02-23 | Disposition: A | Discharge status: Home / Self Care | Source: Ambulatory Visit | Attending: Oncology | Admitting: Oncology

## 2024-02-23 DIAGNOSIS — C3491 Malignant neoplasm of unspecified part of right bronchus or lung: Secondary | ICD-10-CM | POA: Diagnosis not present

## 2024-02-23 DIAGNOSIS — C3490 Malignant neoplasm of unspecified part of unspecified bronchus or lung: Secondary | ICD-10-CM | POA: Diagnosis not present

## 2024-02-23 DIAGNOSIS — K5730 Diverticulosis of large intestine without perforation or abscess without bleeding: Secondary | ICD-10-CM | POA: Diagnosis not present

## 2024-02-23 MED ORDER — HEPARIN SOD (PORK) LOCK FLUSH 100 UNIT/ML IV SOLN
500.0000 [IU] | Freq: Once | INTRAVENOUS | Status: AC
  Administered 2024-02-23: 500 [IU] via INTRAVENOUS
  Filled 2024-02-23: qty 5

## 2024-02-23 MED ORDER — IOHEXOL 300 MG/ML  SOLN
100.0000 mL | Freq: Once | INTRAMUSCULAR | Status: AC | PRN
  Administered 2024-02-23: 100 mL via INTRAVENOUS

## 2024-02-23 MED ORDER — HEPARIN SOD (PORK) LOCK FLUSH 100 UNIT/ML IV SOLN
INTRAVENOUS | Status: AC
Start: 2024-02-23 — End: 2024-02-23
  Filled 2024-02-23: qty 5

## 2024-02-24 ENCOUNTER — Other Ambulatory Visit (INDEPENDENT_AMBULATORY_CARE_PROVIDER_SITE_OTHER): Payer: Self-pay | Admitting: Vascular Surgery

## 2024-02-24 DIAGNOSIS — I70219 Atherosclerosis of native arteries of extremities with intermittent claudication, unspecified extremity: Secondary | ICD-10-CM

## 2024-02-25 ENCOUNTER — Telehealth (INDEPENDENT_AMBULATORY_CARE_PROVIDER_SITE_OTHER): Payer: Self-pay

## 2024-02-25 ENCOUNTER — Encounter (INDEPENDENT_AMBULATORY_CARE_PROVIDER_SITE_OTHER): Payer: Self-pay | Admitting: Vascular Surgery

## 2024-02-25 ENCOUNTER — Ambulatory Visit (INDEPENDENT_AMBULATORY_CARE_PROVIDER_SITE_OTHER): Admitting: Vascular Surgery

## 2024-02-25 ENCOUNTER — Ambulatory Visit (INDEPENDENT_AMBULATORY_CARE_PROVIDER_SITE_OTHER)

## 2024-02-25 VITALS — BP 110/74 | HR 98 | Resp 16 | Ht 71.0 in | Wt 192.4 lb

## 2024-02-25 DIAGNOSIS — E782 Mixed hyperlipidemia: Secondary | ICD-10-CM

## 2024-02-25 DIAGNOSIS — M5416 Radiculopathy, lumbar region: Secondary | ICD-10-CM

## 2024-02-25 DIAGNOSIS — I70219 Atherosclerosis of native arteries of extremities with intermittent claudication, unspecified extremity: Secondary | ICD-10-CM | POA: Diagnosis not present

## 2024-02-25 DIAGNOSIS — I7025 Atherosclerosis of native arteries of other extremities with ulceration: Secondary | ICD-10-CM

## 2024-02-25 DIAGNOSIS — J449 Chronic obstructive pulmonary disease, unspecified: Secondary | ICD-10-CM

## 2024-02-25 NOTE — H&P (View-Only) (Signed)
 MRN : [id number]  Joshua [name] is a 68 y.o. ([birth year]) male who presents with chief complaint of check circulation.  History of Present Illness:   The patient returns to the office for followup and review status post angiogram with intervention on 04/08/2022.    Procedure: Percutaneous transluminal angioplasty right profunda femoris artery Mechanical thrombectomy of the right profunda femoris artery using the Rota Rex catheter.    The patient notes multiple new ulcers or wounds have occurred since the last visit.  He is also having rest pain symptoms.   No documented history of amaurosis fugax or recent TIA symptoms. There are no recent neurological changes noted. No documented history of DVT, PE or superficial thrombophlebitis. The patient denies recent episodes of angina or shortness of breath.    ABI's Rt=0.37 and Lt=1.13  (previous ABI's Rt=0.44 and Lt=1.13)  No outpatient medications have been marked as taking for the 02/25/24 encounter (Appointment) with Jama, Cordella MATSU, MD.    Past Medical History:  Diagnosis Date   Allergy    Cancer (HCC)    lung   COPD (chronic obstructive pulmonary disease) (HCC)    Dyspnea    former smoker. 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   Malignant neoplasm metastatic to brain [facility])    Nausea with vomiting 02/01/2024   Peripheral vascular disease (HCC)    Pre-diabetes    Sleep apnea     Past Surgical History:  Procedure Laterality Date   ANTERIOR CERVICAL DECOMP/DISCECTOMY FUSION N/A 03/14/2020   Procedure: ANTERIOR CERVICAL DECOMPRESSION/DISCECTOMY FUSION 3 LEVELS C4-7;  Surgeon: Clois Fret, MD;  Location: [facility] ORS;  Service: Neurosurgery;  Laterality: N/A;   BACK SURGERY  2011   CENTRAL LINE INSERTION Right 09/10/2016   Procedure: CENTRAL LINE INSERTION;  Surgeon: Jama Cordella MATSU,  MD;  Location: [facility];  Service: Vascular;  Laterality: Right;   CERVICAL FUSION     C4-c7 on Mar 14, 2020   COLONOSCOPY  2013 ?   COLONOSCOPY N/A 09/09/2021   Procedure: COLONOSCOPY;  Surgeon: Onita Elspeth Sharper, DO;  Location: [facility] ENDOSCOPY;  Service: Gastroenterology;  Laterality: N/A;   CORONARY ANGIOPLASTY     ESOPHAGOGASTRODUODENOSCOPY N/A 09/09/2021   Procedure: ESOPHAGOGASTRODUODENOSCOPY (EGD);  Surgeon: Onita Elspeth Sharper, DO;  Location: [facility] ENDOSCOPY;  Service: Gastroenterology;  Laterality: N/A;   FEMORAL-POPLITEAL BYPASS GRAFT Right 09/10/2016   Procedure: BYPASS GRAFT FEMORAL-POPLITEAL ARTERY ( BELOW KNEE );  Surgeon: Jama Cordella MATSU, MD;  Location: [facility] ORS;  Service: Vascular;  Laterality: Right;   FEMORAL-TIBIAL BYPASS GRAFT Right 03/23/2020   Procedure: BYPASS GRAFT RIGHT FEMORAL- DISTAL TIBIAL ARTERY WITH DISTAL FLOW GRAFT;  Surgeon: Jama Cordella MATSU, MD;  Location: [facility] ORS;  Service: Vascular;  Laterality: Right;   LEFT HEART CATH AND CORONARY ANGIOGRAPHY Left 05/16/2021   Procedure: LEFT HEART CATH AND CORONARY ANGIOGRAPHY;  Surgeon: Lawyer Bernardino Cough, MD;  Location: [facility] INVASIVE CV LAB;  Service: Cardiovascular;  Laterality: Left;   LOWER EXTREMITY ANGIOGRAPHY Right 11/18/2016   Procedure: Lower Extremity Angiography;  Surgeon: Cordella KANDICE Shawl, MD;  Location: [facility] INVASIVE CV LAB;  Service: Cardiovascular;  Laterality: Right;   LOWER EXTREMITY ANGIOGRAPHY Right 12/09/2016   Procedure: Lower Extremity Angiography;  Surgeon: Shawl Cordella KANDICE, MD;  Location: [facility] INVASIVE CV LAB;  Service: Cardiovascular;  Laterality: Right;   LOWER EXTREMITY ANGIOGRAPHY Right 11/15/2019   Procedure: LOWER EXTREMITY ANGIOGRAPHY;  Surgeon: Shawl Cordella KANDICE, MD;  Location: [facility] INVASIVE CV LAB;  Service: Cardiovascular;  Laterality: Right;   LOWER EXTREMITY ANGIOGRAPHY Right 12/01/2019   Procedure: Lower Extremity Angiography;  Surgeon: Marea Selinda RAMAN, MD;  Location: [facility] INVASIVE  CV LAB;  Service: Cardiovascular;  Laterality: Right;   LOWER EXTREMITY ANGIOGRAPHY Right 12/02/2019   Procedure: LOWER EXTREMITY ANGIOGRAPHY;  Surgeon: Shawl Cordella KANDICE, MD;  Location: [facility] INVASIVE CV LAB;  Service: Cardiovascular;  Laterality: Right;   LOWER EXTREMITY ANGIOGRAPHY Right 03/23/2020   Procedure: Lower Extremity Angiography;  Surgeon: Shawl Cordella KANDICE, MD;  Location: [facility] INVASIVE CV LAB;  Service: Cardiovascular;  Laterality: Right;   LOWER EXTREMITY ANGIOGRAPHY Right 09/25/2021   Procedure: Lower Extremity Angiography;  Surgeon: Shawl Cordella KANDICE, MD;  Location: [facility] INVASIVE CV LAB;  Service: Cardiovascular;  Laterality: Right;   LOWER EXTREMITY ANGIOGRAPHY Right 09/26/2021   Procedure: Lower Extremity Angiography;  Surgeon: Marea Selinda RAMAN, MD;  Location: [facility] INVASIVE CV LAB;  Service: Cardiovascular;  Laterality: Right;   LOWER EXTREMITY ANGIOGRAPHY Right 04/08/2022   Procedure: Lower Extremity Angiography;  Surgeon: Shawl Cordella KANDICE, MD;  Location: [facility] INVASIVE CV LAB;  Service: Cardiovascular;  Laterality: Right;   LOWER EXTREMITY INTERVENTION  11/18/2016   Procedure: Lower Extremity Intervention;  Surgeon: Cordella KANDICE Shawl, MD;  Location: [facility] INVASIVE CV LAB;  Service: Cardiovascular;;   PERIPHERAL VASCULAR CATHETERIZATION Right 01/02/2015   Procedure: Lower Extremity Angiography;  Surgeon: Cordella KANDICE Shawl, MD;  Location: [facility] INVASIVE CV LAB;  Service: Cardiovascular;  Laterality: Right;   PERIPHERAL VASCULAR CATHETERIZATION Right 06/18/2015   Procedure: Lower Extremity Angiography;  Surgeon: Selinda RAMAN Marea, MD;  Location: [facility] INVASIVE CV LAB;  Service: Cardiovascular;  Laterality: Right;   PERIPHERAL VASCULAR CATHETERIZATION  06/18/2015   Procedure: Lower Extremity Intervention;  Surgeon: Selinda RAMAN Marea, MD;  Location: [facility] INVASIVE CV LAB;  Service: Cardiovascular;;   PERIPHERAL VASCULAR CATHETERIZATION N/A 10/09/2015   Procedure: Abdominal Aortogram w/Lower Extremity;   Surgeon: Cordella KANDICE Shawl, MD;  Location: [facility] INVASIVE CV LAB;  Service: Cardiovascular;  Laterality: N/A;   PERIPHERAL VASCULAR CATHETERIZATION  10/09/2015   Procedure: Lower Extremity Intervention;  Surgeon: Cordella KANDICE Shawl, MD;  Location: [facility] INVASIVE CV LAB;  Service: Cardiovascular;;   PERIPHERAL VASCULAR CATHETERIZATION Right 10/10/2015   Procedure: Lower Extremity Angiography;  Surgeon: Selinda RAMAN Marea, MD;  Location: [facility] INVASIVE CV LAB;  Service: Cardiovascular;  Laterality: Right;   PERIPHERAL VASCULAR CATHETERIZATION Right 10/10/2015   Procedure: Lower Extremity Intervention;  Surgeon: Selinda RAMAN Marea, MD;  Location: [facility] INVASIVE CV LAB;  Service: Cardiovascular;  Laterality: Right;   PERIPHERAL VASCULAR CATHETERIZATION Right 12/03/2015   Procedure: Lower Extremity Angiography;  Surgeon: Selinda RAMAN Marea, MD;  Location: [facility] INVASIVE CV LAB;  Service: Cardiovascular;  Laterality: Right;   PERIPHERAL VASCULAR CATHETERIZATION Right 12/04/2015   Procedure: Lower Extremity Angiography;  Surgeon: Selinda RAMAN Marea, MD;  Location: [facility] INVASIVE CV LAB;  Service: Cardiovascular;  Laterality: Right;   PERIPHERAL VASCULAR CATHETERIZATION  12/04/2015   Procedure: Lower Extremity Intervention;  Surgeon: Selinda RAMAN Marea, MD;  Location: [facility] INVASIVE CV LAB;  Service: Cardiovascular;;   PERIPHERAL  VASCULAR CATHETERIZATION Right 06/30/2016   Procedure: Lower Extremity Angiography;  Surgeon: Selinda GORMAN Gu, MD;  Location: [facility] INVASIVE CV LAB;  Service: Cardiovascular;  Laterality: Right;   PERIPHERAL VASCULAR CATHETERIZATION Right 06/25/2016   Procedure: Lower Extremity Angiography;  Surgeon: Cordella KANDICE Shawl, MD;  Location: [facility] INVASIVE CV LAB;  Service: Cardiovascular;  Laterality: Right;   PERIPHERAL VASCULAR CATHETERIZATION Right 07/01/2016   Procedure: Lower Extremity Angiography;  Surgeon: Cordella KANDICE Shawl, MD;  Location: [facility] INVASIVE CV LAB;  Service: Cardiovascular;  Laterality: Right;   PERIPHERAL VASCULAR  CATHETERIZATION  07/01/2016   Procedure: Lower Extremity Intervention;  Surgeon: Cordella KANDICE Shawl, MD;  Location: [facility] INVASIVE CV LAB;  Service: Cardiovascular;;   PERIPHERAL VASCULAR CATHETERIZATION Right 08/01/2016   Procedure: Lower Extremity Angiography;  Surgeon: Cordella KANDICE Shawl, MD;  Location: [facility] INVASIVE CV LAB;  Service: Cardiovascular;  Laterality: Right;   PORTA CATH INSERTION N/A 10/05/2020   Procedure: PORTA CATH INSERTION;  Surgeon: Shawl Cordella KANDICE, MD;  Location: [facility] INVASIVE CV LAB;  Service: Cardiovascular;  Laterality: N/A;   stent placement in right leg Right    VIDEO BRONCHOSCOPY N/A 09/14/2020   Procedure: VIDEO BRONCHOSCOPY WITH FLUORO;  Surgeon: Tamea Dedra CROME, MD;  Location: [facility] OR[facility];  Service: Cardiopulmonary;  Laterality: N/A;   VIDEO BRONCHOSCOPY WITH ENDOBRONCHIAL ULTRASOUND N/A 09/14/2020   Procedure: VIDEO BRONCHOSCOPY WITH ENDOBRONCHIAL ULTRASOUND;  Surgeon: Tamea Dedra CROME, MD;  Location: [facility];  Service: Cardiopulmonary;  Laterality: N/A;    Social History Social History   Tobacco Use   Smoking status: Former    Current packs/day: 0.00    Average packs/day: 2.0 packs/day for 38.0 years (76.0 ttl pk-yrs)    Types: Cigarettes    Start date: 12/19/1976    Quit date: 12/20/2014    Years since quitting: 9.1   Smokeless tobacco: Never   Tobacco comments:    quit smoking in 2017 most smoked 2   Vaping Use   Vaping status: Former  Substance Use Topics   Alcohol  use: Not Currently   Drug use: No    Family History Family History  Problem Relation Age of Onset   Diabetes Mother    Hypertension Mother    Heart murmur Mother    Leukemia Mother    Throat cancer Maternal Grandmother    Colon cancer Neg Hx    Breast cancer Neg Hx     No Known Allergies   REVIEW OF SYSTEMS (Negative unless checked)  Constitutional: [] Weight loss  [] Fever  [] Chills Cardiac: [] Chest pain   [] Chest pressure   [] Palpitations   [] Shortness of breath when  laying flat   [] Shortness of breath with exertion. Vascular:  [x] Pain in legs with walking   [] Pain in legs at rest  [] History of DVT   [] Phlebitis   [] Swelling in legs   [] Varicose veins   [] Non-healing ulcers Pulmonary:   [] Uses home oxygen   [] Productive cough   [] Hemoptysis   [] Wheeze  [] COPD   [] Asthma Neurologic:  [] Dizziness   [] Seizures   [] History of stroke   [] History of TIA  [] Aphasia   [] Vissual changes   [] Weakness or numbness in arm   [] Weakness or numbness in leg Musculoskeletal:   [] Joint swelling   [] Joint pain   [] Low back pain Hematologic:  [] Easy bruising  [] Easy bleeding   [] Hypercoagulable state   [] Anemic Gastrointestinal:  [] Diarrhea   [] Vomiting  [] Gastroesophageal reflux/heartburn   [] Difficulty swallowing. Genitourinary:  [] Chronic kidney disease   [] Difficult urination  [] Frequent  urination   [] Blood in urine Skin:  [] Rashes   [] Ulcers  Psychological:  [] History of anxiety   []  History of major depression.  Physical Examination  There were no vitals filed for this visit. There is no height or weight on file to calculate BMI. Gen: WD/WN, NAD Head: /AT, No temporalis wasting.  Ear/Nose/Throat: Hearing grossly intact, nares w/o erythema or drainage Eyes: PER, EOMI, sclera nonicteric.  Neck: Supple, no masses.  No bruit or JVD.  Pulmonary:  Good air movement, no audible wheezing, no use of accessory muscles.  Cardiac: RRR, normal S1, S2, no Murmurs. Vascular: Moderate trophic changes, multiple open wounds of the right foot and shin area Vessel Right Left  Radial Palpable Palpable  PT Not Palpable Not Palpable  DP Not Palpable Palpable  Gastrointestinal: soft, non-distended. No guarding/no peritoneal signs.  Musculoskeletal: M/S 5/5 throughout.  No visible deformity.  Neurologic: CN 2-12 intact. Pain and light touch intact in extremities.  Symmetrical.  Speech is fluent. Motor exam as listed above. Psychiatric: Judgment intact, Mood & affect appropriate for pt's  clinical situation. Dermatologic: No rashes or ulcers noted.  No changes consistent with cellulitis.   CBC Lab Results  Component Value Date   WBC 5.8 02/15/2024   HGB 13.5 02/15/2024   HCT 42.6 02/15/2024   MCV 86.8 02/15/2024   PLT 286 02/15/2024    BMET    Component Value Date/Time   NA 138 02/15/2024 0833   NA 138 06/02/2023 1049   K 3.9 02/15/2024 0833   CL 102 02/15/2024 0833   CO2 21 (L) 02/15/2024 0833   GLUCOSE 111 (H) 02/15/2024 0833   BUN 13 02/15/2024 0833   BUN 18 06/02/2023 1049   BUN 16 07/03/2014 1205   CREATININE 0.98 02/15/2024 0833   CREATININE 0.93 02/08/2024 1309   CREATININE 1.34 (H) 07/03/2014 1205   CALCIUM  9.0 02/15/2024 0833   GFRNONAA >60 02/15/2024 0833   GFRNONAA >60 02/08/2024 1309   GFRNONAA 59 (L) 07/03/2014 1205   GFRAA >60 03/30/2020 0536   GFRAA >60 07/03/2014 1205   Estimated Creatinine Clearance: 80 mL/min (by C-G formula based on SCr of 0.98 mg/dL).  COAG Lab Results  Component Value Date   INR 1.1 12/17/2023   INR 1.0 08/20/2023   INR 1.2 09/26/2021    Radiology No results found.   Assessment/Plan 1. Atherosclerosis of native arteries of the extremities with ulceration (HCC)  Recommend:  The patient has evidence of severe atherosclerotic changes of both lower extremities associated with ulceration and tissue loss of the right foot.  This represents a limb threatening ischemia and places the patient at the risk for right limb loss.  Patient should undergo angiography of the right lower extremity with the hope for intervention for limb salvage.  The risks and benefits as well as the alternative therapies was discussed in detail with the patient.  All questions were answered.  Patient agrees to proceed with right leg angiography.  The patient will follow up with me in the office after the procedure.   P29.776    critical limb ischemia of the lower extremity I70.25      Atherosclerotic occlusive disease with  ulceration  CPT codes: 62773   stent placement femoral-popliteal artery 36247   introduction catheter below diaphragm third order  2. Chronic obstructive pulmonary disease, unspecified COPD type (HCC) (Primary) Continue pulmonary medications and aerosols as already ordered, these medications have been reviewed and there are no changes at this time.  3. Radiculopathy of lumbar region Continue medications to treat the patient's degenerative disease as already ordered, these medications have been reviewed and there are no changes at this time.  Continued activity and therapy was stressed.  4. Mixed hyperlipidemia Continue statin as ordered and reviewed, no changes at this time    [name], MD  02/25/2024 1:14 PM

## 2024-02-25 NOTE — Telephone Encounter (Signed)
 Spoke with the patient and he is scheduled with Dr. Jama for a right leg angio on 03/04/24 with a 12:00 pm arrival time to the [facility]. Pre-procedure instructions were discussed and will be sent to Mychart.

## 2024-02-25 NOTE — Progress Notes (Signed)
 MRN : [id number]  Joshua [name] is a 68 y.o. ([birth year]) male who presents with chief complaint of check circulation.  History of Present Illness:   The patient returns to the office for followup and review status post angiogram with intervention on 04/08/2022.    Procedure: Percutaneous transluminal angioplasty right profunda femoris artery Mechanical thrombectomy of the right profunda femoris artery using the Rota Rex catheter.    The patient notes multiple new ulcers or wounds have occurred since the last visit.  He is also having rest pain symptoms.   No documented history of amaurosis fugax or recent TIA symptoms. There are no recent neurological changes noted. No documented history of DVT, PE or superficial thrombophlebitis. The patient denies recent episodes of angina or shortness of breath.    ABI's Rt=0.37 and Lt=1.13  (previous ABI's Rt=0.44 and Lt=1.13)  No outpatient medications have been marked as taking for the 02/25/24 encounter (Appointment) with Jama, Cordella MATSU, MD.    Past Medical History:  Diagnosis Date   Allergy    Cancer (HCC)    lung   COPD (chronic obstructive pulmonary disease) (HCC)    Dyspnea    former smoker. 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   Malignant neoplasm metastatic to brain [facility])    Nausea with vomiting 02/01/2024   Peripheral vascular disease (HCC)    Pre-diabetes    Sleep apnea     Past Surgical History:  Procedure Laterality Date   ANTERIOR CERVICAL DECOMP/DISCECTOMY FUSION N/A 03/14/2020   Procedure: ANTERIOR CERVICAL DECOMPRESSION/DISCECTOMY FUSION 3 LEVELS C4-7;  Surgeon: Clois Fret, MD;  Location: [facility] ORS;  Service: Neurosurgery;  Laterality: N/A;   BACK SURGERY  2011   CENTRAL LINE INSERTION Right 09/10/2016   Procedure: CENTRAL LINE INSERTION;  Surgeon: Jama Cordella MATSU,  MD;  Location: [facility];  Service: Vascular;  Laterality: Right;   CERVICAL FUSION     C4-c7 on Mar 14, 2020   COLONOSCOPY  2013 ?   COLONOSCOPY N/A 09/09/2021   Procedure: COLONOSCOPY;  Surgeon: Onita Elspeth Sharper, DO;  Location: [facility] ENDOSCOPY;  Service: Gastroenterology;  Laterality: N/A;   CORONARY ANGIOPLASTY     ESOPHAGOGASTRODUODENOSCOPY N/A 09/09/2021   Procedure: ESOPHAGOGASTRODUODENOSCOPY (EGD);  Surgeon: Onita Elspeth Sharper, DO;  Location: [facility] ENDOSCOPY;  Service: Gastroenterology;  Laterality: N/A;   FEMORAL-POPLITEAL BYPASS GRAFT Right 09/10/2016   Procedure: BYPASS GRAFT FEMORAL-POPLITEAL ARTERY ( BELOW KNEE );  Surgeon: Jama Cordella MATSU, MD;  Location: [facility] ORS;  Service: Vascular;  Laterality: Right;   FEMORAL-TIBIAL BYPASS GRAFT Right 03/23/2020   Procedure: BYPASS GRAFT RIGHT FEMORAL- DISTAL TIBIAL ARTERY WITH DISTAL FLOW GRAFT;  Surgeon: Jama Cordella MATSU, MD;  Location: [facility] ORS;  Service: Vascular;  Laterality: Right;   LEFT HEART CATH AND CORONARY ANGIOGRAPHY Left 05/16/2021   Procedure: LEFT HEART CATH AND CORONARY ANGIOGRAPHY;  Surgeon: Lawyer Bernardino Cough, MD;  Location: [facility] INVASIVE CV LAB;  Service: Cardiovascular;  Laterality: Left;   LOWER EXTREMITY ANGIOGRAPHY Right 11/18/2016   Procedure: Lower Extremity Angiography;  Surgeon: Cordella KANDICE Shawl, MD;  Location: [facility] INVASIVE CV LAB;  Service: Cardiovascular;  Laterality: Right;   LOWER EXTREMITY ANGIOGRAPHY Right 12/09/2016   Procedure: Lower Extremity Angiography;  Surgeon: Shawl Cordella KANDICE, MD;  Location: [facility] INVASIVE CV LAB;  Service: Cardiovascular;  Laterality: Right;   LOWER EXTREMITY ANGIOGRAPHY Right 11/15/2019   Procedure: LOWER EXTREMITY ANGIOGRAPHY;  Surgeon: Shawl Cordella KANDICE, MD;  Location: [facility] INVASIVE CV LAB;  Service: Cardiovascular;  Laterality: Right;   LOWER EXTREMITY ANGIOGRAPHY Right 12/01/2019   Procedure: Lower Extremity Angiography;  Surgeon: Marea Selinda RAMAN, MD;  Location: [facility] INVASIVE  CV LAB;  Service: Cardiovascular;  Laterality: Right;   LOWER EXTREMITY ANGIOGRAPHY Right 12/02/2019   Procedure: LOWER EXTREMITY ANGIOGRAPHY;  Surgeon: Shawl Cordella KANDICE, MD;  Location: [facility] INVASIVE CV LAB;  Service: Cardiovascular;  Laterality: Right;   LOWER EXTREMITY ANGIOGRAPHY Right 03/23/2020   Procedure: Lower Extremity Angiography;  Surgeon: Shawl Cordella KANDICE, MD;  Location: [facility] INVASIVE CV LAB;  Service: Cardiovascular;  Laterality: Right;   LOWER EXTREMITY ANGIOGRAPHY Right 09/25/2021   Procedure: Lower Extremity Angiography;  Surgeon: Shawl Cordella KANDICE, MD;  Location: [facility] INVASIVE CV LAB;  Service: Cardiovascular;  Laterality: Right;   LOWER EXTREMITY ANGIOGRAPHY Right 09/26/2021   Procedure: Lower Extremity Angiography;  Surgeon: Marea Selinda RAMAN, MD;  Location: [facility] INVASIVE CV LAB;  Service: Cardiovascular;  Laterality: Right;   LOWER EXTREMITY ANGIOGRAPHY Right 04/08/2022   Procedure: Lower Extremity Angiography;  Surgeon: Shawl Cordella KANDICE, MD;  Location: [facility] INVASIVE CV LAB;  Service: Cardiovascular;  Laterality: Right;   LOWER EXTREMITY INTERVENTION  11/18/2016   Procedure: Lower Extremity Intervention;  Surgeon: Cordella KANDICE Shawl, MD;  Location: [facility] INVASIVE CV LAB;  Service: Cardiovascular;;   PERIPHERAL VASCULAR CATHETERIZATION Right 01/02/2015   Procedure: Lower Extremity Angiography;  Surgeon: Cordella KANDICE Shawl, MD;  Location: [facility] INVASIVE CV LAB;  Service: Cardiovascular;  Laterality: Right;   PERIPHERAL VASCULAR CATHETERIZATION Right 06/18/2015   Procedure: Lower Extremity Angiography;  Surgeon: Selinda RAMAN Marea, MD;  Location: [facility] INVASIVE CV LAB;  Service: Cardiovascular;  Laterality: Right;   PERIPHERAL VASCULAR CATHETERIZATION  06/18/2015   Procedure: Lower Extremity Intervention;  Surgeon: Selinda RAMAN Marea, MD;  Location: [facility] INVASIVE CV LAB;  Service: Cardiovascular;;   PERIPHERAL VASCULAR CATHETERIZATION N/A 10/09/2015   Procedure: Abdominal Aortogram w/Lower Extremity;   Surgeon: Cordella KANDICE Shawl, MD;  Location: [facility] INVASIVE CV LAB;  Service: Cardiovascular;  Laterality: N/A;   PERIPHERAL VASCULAR CATHETERIZATION  10/09/2015   Procedure: Lower Extremity Intervention;  Surgeon: Cordella KANDICE Shawl, MD;  Location: [facility] INVASIVE CV LAB;  Service: Cardiovascular;;   PERIPHERAL VASCULAR CATHETERIZATION Right 10/10/2015   Procedure: Lower Extremity Angiography;  Surgeon: Selinda RAMAN Marea, MD;  Location: [facility] INVASIVE CV LAB;  Service: Cardiovascular;  Laterality: Right;   PERIPHERAL VASCULAR CATHETERIZATION Right 10/10/2015   Procedure: Lower Extremity Intervention;  Surgeon: Selinda RAMAN Marea, MD;  Location: [facility] INVASIVE CV LAB;  Service: Cardiovascular;  Laterality: Right;   PERIPHERAL VASCULAR CATHETERIZATION Right 12/03/2015   Procedure: Lower Extremity Angiography;  Surgeon: Selinda RAMAN Marea, MD;  Location: [facility] INVASIVE CV LAB;  Service: Cardiovascular;  Laterality: Right;   PERIPHERAL VASCULAR CATHETERIZATION Right 12/04/2015   Procedure: Lower Extremity Angiography;  Surgeon: Selinda RAMAN Marea, MD;  Location: [facility] INVASIVE CV LAB;  Service: Cardiovascular;  Laterality: Right;   PERIPHERAL VASCULAR CATHETERIZATION  12/04/2015   Procedure: Lower Extremity Intervention;  Surgeon: Selinda RAMAN Marea, MD;  Location: [facility] INVASIVE CV LAB;  Service: Cardiovascular;;   PERIPHERAL  VASCULAR CATHETERIZATION Right 06/30/2016   Procedure: Lower Extremity Angiography;  Surgeon: Selinda GORMAN Gu, MD;  Location: [facility] INVASIVE CV LAB;  Service: Cardiovascular;  Laterality: Right;   PERIPHERAL VASCULAR CATHETERIZATION Right 06/25/2016   Procedure: Lower Extremity Angiography;  Surgeon: Cordella KANDICE Shawl, MD;  Location: [facility] INVASIVE CV LAB;  Service: Cardiovascular;  Laterality: Right;   PERIPHERAL VASCULAR CATHETERIZATION Right 07/01/2016   Procedure: Lower Extremity Angiography;  Surgeon: Cordella KANDICE Shawl, MD;  Location: [facility] INVASIVE CV LAB;  Service: Cardiovascular;  Laterality: Right;   PERIPHERAL VASCULAR  CATHETERIZATION  07/01/2016   Procedure: Lower Extremity Intervention;  Surgeon: Cordella KANDICE Shawl, MD;  Location: [facility] INVASIVE CV LAB;  Service: Cardiovascular;;   PERIPHERAL VASCULAR CATHETERIZATION Right 08/01/2016   Procedure: Lower Extremity Angiography;  Surgeon: Cordella KANDICE Shawl, MD;  Location: [facility] INVASIVE CV LAB;  Service: Cardiovascular;  Laterality: Right;   PORTA CATH INSERTION N/A 10/05/2020   Procedure: PORTA CATH INSERTION;  Surgeon: Shawl Cordella KANDICE, MD;  Location: [facility] INVASIVE CV LAB;  Service: Cardiovascular;  Laterality: N/A;   stent placement in right leg Right    VIDEO BRONCHOSCOPY N/A 09/14/2020   Procedure: VIDEO BRONCHOSCOPY WITH FLUORO;  Surgeon: Tamea Dedra CROME, MD;  Location: [facility] OR[facility];  Service: Cardiopulmonary;  Laterality: N/A;   VIDEO BRONCHOSCOPY WITH ENDOBRONCHIAL ULTRASOUND N/A 09/14/2020   Procedure: VIDEO BRONCHOSCOPY WITH ENDOBRONCHIAL ULTRASOUND;  Surgeon: Tamea Dedra CROME, MD;  Location: [facility];  Service: Cardiopulmonary;  Laterality: N/A;    Social History Social History   Tobacco Use   Smoking status: Former    Current packs/day: 0.00    Average packs/day: 2.0 packs/day for 38.0 years (76.0 ttl pk-yrs)    Types: Cigarettes    Start date: 12/19/1976    Quit date: 12/20/2014    Years since quitting: 9.1   Smokeless tobacco: Never   Tobacco comments:    quit smoking in 2017 most smoked 2   Vaping Use   Vaping status: Former  Substance Use Topics   Alcohol  use: Not Currently   Drug use: No    Family History Family History  Problem Relation Age of Onset   Diabetes Mother    Hypertension Mother    Heart murmur Mother    Leukemia Mother    Throat cancer Maternal Grandmother    Colon cancer Neg Hx    Breast cancer Neg Hx     No Known Allergies   REVIEW OF SYSTEMS (Negative unless checked)  Constitutional: [] Weight loss  [] Fever  [] Chills Cardiac: [] Chest pain   [] Chest pressure   [] Palpitations   [] Shortness of breath when  laying flat   [] Shortness of breath with exertion. Vascular:  [x] Pain in legs with walking   [] Pain in legs at rest  [] History of DVT   [] Phlebitis   [] Swelling in legs   [] Varicose veins   [] Non-healing ulcers Pulmonary:   [] Uses home oxygen   [] Productive cough   [] Hemoptysis   [] Wheeze  [] COPD   [] Asthma Neurologic:  [] Dizziness   [] Seizures   [] History of stroke   [] History of TIA  [] Aphasia   [] Vissual changes   [] Weakness or numbness in arm   [] Weakness or numbness in leg Musculoskeletal:   [] Joint swelling   [] Joint pain   [] Low back pain Hematologic:  [] Easy bruising  [] Easy bleeding   [] Hypercoagulable state   [] Anemic Gastrointestinal:  [] Diarrhea   [] Vomiting  [] Gastroesophageal reflux/heartburn   [] Difficulty swallowing. Genitourinary:  [] Chronic kidney disease   [] Difficult urination  [] Frequent  urination   [] Blood in urine Skin:  [] Rashes   [] Ulcers  Psychological:  [] History of anxiety   []  History of major depression.  Physical Examination  There were no vitals filed for this visit. There is no height or weight on file to calculate BMI. Gen: WD/WN, NAD Head: /AT, No temporalis wasting.  Ear/Nose/Throat: Hearing grossly intact, nares w/o erythema or drainage Eyes: PER, EOMI, sclera nonicteric.  Neck: Supple, no masses.  No bruit or JVD.  Pulmonary:  Good air movement, no audible wheezing, no use of accessory muscles.  Cardiac: RRR, normal S1, S2, no Murmurs. Vascular: Moderate trophic changes, multiple open wounds of the right foot and shin area Vessel Right Left  Radial Palpable Palpable  PT Not Palpable Not Palpable  DP Not Palpable Palpable  Gastrointestinal: soft, non-distended. No guarding/no peritoneal signs.  Musculoskeletal: M/S 5/5 throughout.  No visible deformity.  Neurologic: CN 2-12 intact. Pain and light touch intact in extremities.  Symmetrical.  Speech is fluent. Motor exam as listed above. Psychiatric: Judgment intact, Mood & affect appropriate for pt's  clinical situation. Dermatologic: No rashes or ulcers noted.  No changes consistent with cellulitis.   CBC Lab Results  Component Value Date   WBC 5.8 02/15/2024   HGB 13.5 02/15/2024   HCT 42.6 02/15/2024   MCV 86.8 02/15/2024   PLT 286 02/15/2024    BMET    Component Value Date/Time   NA 138 02/15/2024 0833   NA 138 06/02/2023 1049   K 3.9 02/15/2024 0833   CL 102 02/15/2024 0833   CO2 21 (L) 02/15/2024 0833   GLUCOSE 111 (H) 02/15/2024 0833   BUN 13 02/15/2024 0833   BUN 18 06/02/2023 1049   BUN 16 07/03/2014 1205   CREATININE 0.98 02/15/2024 0833   CREATININE 0.93 02/08/2024 1309   CREATININE 1.34 (H) 07/03/2014 1205   CALCIUM  9.0 02/15/2024 0833   GFRNONAA >60 02/15/2024 0833   GFRNONAA >60 02/08/2024 1309   GFRNONAA 59 (L) 07/03/2014 1205   GFRAA >60 03/30/2020 0536   GFRAA >60 07/03/2014 1205   Estimated Creatinine Clearance: 80 mL/min (by C-G formula based on SCr of 0.98 mg/dL).  COAG Lab Results  Component Value Date   INR 1.1 12/17/2023   INR 1.0 08/20/2023   INR 1.2 09/26/2021    Radiology No results found.   Assessment/Plan 1. Atherosclerosis of native arteries of the extremities with ulceration (HCC)  Recommend:  The patient has evidence of severe atherosclerotic changes of both lower extremities associated with ulceration and tissue loss of the right foot.  This represents a limb threatening ischemia and places the patient at the risk for right limb loss.  Patient should undergo angiography of the right lower extremity with the hope for intervention for limb salvage.  The risks and benefits as well as the alternative therapies was discussed in detail with the patient.  All questions were answered.  Patient agrees to proceed with right leg angiography.  The patient will follow up with me in the office after the procedure.   P29.776    critical limb ischemia of the lower extremity I70.25      Atherosclerotic occlusive disease with  ulceration  CPT codes: 62773   stent placement femoral-popliteal artery 36247   introduction catheter below diaphragm third order  2. Chronic obstructive pulmonary disease, unspecified COPD type (HCC) (Primary) Continue pulmonary medications and aerosols as already ordered, these medications have been reviewed and there are no changes at this time.  3. Radiculopathy of lumbar region Continue medications to treat the patient's degenerative disease as already ordered, these medications have been reviewed and there are no changes at this time.  Continued activity and therapy was stressed.  4. Mixed hyperlipidemia Continue statin as ordered and reviewed, no changes at this time    [name], MD  02/25/2024 1:14 PM

## 2024-02-29 ENCOUNTER — Other Ambulatory Visit: Payer: Self-pay

## 2024-02-29 ENCOUNTER — Ambulatory Visit: Admitting: Physician Assistant

## 2024-02-29 LAB — VAS US ABI WITH/WO TBI
Left ABI: 1.13
Right ABI: 0.37

## 2024-02-29 MED FILL — Levetiracetam Tab 1000 MG: ORAL | 30 days supply | Qty: 60 | Fill #2 | Status: AC

## 2024-03-01 ENCOUNTER — Other Ambulatory Visit: Payer: Self-pay

## 2024-03-01 ENCOUNTER — Other Ambulatory Visit: Payer: Self-pay | Admitting: Oncology

## 2024-03-02 ENCOUNTER — Other Ambulatory Visit: Payer: Self-pay

## 2024-03-02 ENCOUNTER — Encounter: Payer: Self-pay | Admitting: Oncology

## 2024-03-02 ENCOUNTER — Inpatient Hospital Stay: Attending: Oncology

## 2024-03-02 ENCOUNTER — Inpatient Hospital Stay

## 2024-03-02 ENCOUNTER — Inpatient Hospital Stay (HOSPITAL_BASED_OUTPATIENT_CLINIC_OR_DEPARTMENT_OTHER): Admitting: Oncology

## 2024-03-02 VITALS — BP 109/76 | HR 99 | Temp 98.5°F | Resp 18 | Wt 191.7 lb

## 2024-03-02 VITALS — BP 132/69 | HR 82 | Temp 96.3°F | Resp 19

## 2024-03-02 DIAGNOSIS — E876 Hypokalemia: Secondary | ICD-10-CM

## 2024-03-02 DIAGNOSIS — R000 Tachycardia, unspecified: Secondary | ICD-10-CM | POA: Diagnosis not present

## 2024-03-02 DIAGNOSIS — C3491 Malignant neoplasm of unspecified part of right bronchus or lung: Secondary | ICD-10-CM

## 2024-03-02 DIAGNOSIS — E032 Hypothyroidism due to medicaments and other exogenous substances: Secondary | ICD-10-CM | POA: Diagnosis not present

## 2024-03-02 DIAGNOSIS — E039 Hypothyroidism, unspecified: Secondary | ICD-10-CM | POA: Insufficient documentation

## 2024-03-02 DIAGNOSIS — Z5112 Encounter for antineoplastic immunotherapy: Secondary | ICD-10-CM

## 2024-03-02 DIAGNOSIS — C7931 Secondary malignant neoplasm of brain: Secondary | ICD-10-CM

## 2024-03-02 DIAGNOSIS — R569 Unspecified convulsions: Secondary | ICD-10-CM | POA: Insufficient documentation

## 2024-03-02 DIAGNOSIS — Z79890 Hormone replacement therapy (postmenopausal): Secondary | ICD-10-CM | POA: Insufficient documentation

## 2024-03-02 DIAGNOSIS — C3411 Malignant neoplasm of upper lobe, right bronchus or lung: Secondary | ICD-10-CM | POA: Diagnosis not present

## 2024-03-02 LAB — COMPREHENSIVE METABOLIC PANEL WITH GFR
ALT: 12 U/L (ref 0–44)
AST: 28 U/L (ref 15–41)
Albumin: 3 g/dL — ABNORMAL LOW (ref 3.5–5.0)
Alkaline Phosphatase: 76 U/L (ref 38–126)
Anion gap: 9 (ref 5–15)
BUN: 6 mg/dL — ABNORMAL LOW (ref 8–23)
CO2: 25 mmol/L (ref 22–32)
Calcium: 8.9 mg/dL (ref 8.9–10.3)
Chloride: 106 mmol/L (ref 98–111)
Creatinine, Ser: 0.87 mg/dL (ref 0.61–1.24)
GFR, Estimated: >60 mL/min (ref >60)
Glucose, Bld: 114 mg/dL — ABNORMAL HIGH (ref 70–99)
Potassium: 3.6 mmol/L (ref 3.5–5.1)
Sodium: 140 mmol/L (ref 135–145)
Total Bilirubin: 0.8 mg/dL (ref 0.0–1.2)
Total Protein: 6.2 g/dL — ABNORMAL LOW (ref 6.5–8.1)

## 2024-03-02 LAB — CBC WITH DIFFERENTIAL/PLATELET
Abs Immature Granulocytes: 0.01 K/uL (ref 0.00–0.07)
Basophils Absolute: 0.1 K/uL (ref 0.0–0.1)
Basophils Relative: 2 %
Eosinophils Absolute: 0.3 K/uL (ref 0.0–0.5)
Eosinophils Relative: 7 %
HCT: 40.9 % (ref 39.0–52.0)
Hemoglobin: 13.1 g/dL (ref 13.0–17.0)
Immature Granulocytes: 0 %
Lymphocytes Relative: 19 %
Lymphs Abs: 0.9 K/uL (ref 0.7–4.0)
MCH: 27.3 pg (ref 26.0–34.0)
MCHC: 32 g/dL (ref 30.0–36.0)
MCV: 85.4 fL (ref 80.0–100.0)
Monocytes Absolute: 0.6 K/uL (ref 0.1–1.0)
Monocytes Relative: 14 %
Neutro Abs: 2.7 K/uL (ref 1.7–7.7)
Neutrophils Relative %: 58 %
Platelets: 247 K/uL (ref 150–400)
RBC: 4.79 MIL/uL (ref 4.22–5.81)
RDW: 14.8 % (ref 11.5–15.5)
WBC: 4.6 K/uL (ref 4.0–10.5)
nRBC: 0 % (ref 0.0–0.2)

## 2024-03-02 LAB — TOTAL PROTEIN, URINE DIPSTICK: Protein, ur: NEGATIVE mg/dL

## 2024-03-02 MED ORDER — POTASSIUM CHLORIDE CRYS ER 20 MEQ PO TBCR
20.0000 meq | EXTENDED_RELEASE_TABLET | Freq: Every day | ORAL | 0 refills | Status: DC
  Filled 2024-03-02 (×2): qty 30, 30d supply, fill #0

## 2024-03-02 MED ORDER — SODIUM CHLORIDE 0.9 % IV SOLN
10.0000 mg/kg | Freq: Once | INTRAVENOUS | Status: AC
  Administered 2024-03-02 (×2): 860 mg via INTRAVENOUS
  Filled 2024-03-02: qty 7.2

## 2024-03-02 MED ORDER — SODIUM CHLORIDE 0.9 % IV SOLN
10.0000 mg/kg | Freq: Once | INTRAVENOUS | Status: AC
  Administered 2024-03-02 (×2): 900 mg via INTRAVENOUS
  Filled 2024-03-02: qty 4

## 2024-03-02 MED ORDER — SODIUM CHLORIDE 0.9 % IV SOLN
Freq: Once | INTRAVENOUS | Status: AC
Start: 2024-03-02 — End: 2024-03-02
  Filled 2024-03-02: qty 250

## 2024-03-02 NOTE — Assessment & Plan Note (Signed)
 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  Labs are reviewed and discussed with patient. Proceed with Durvalumab   CT scan showed no disease recurrence.

## 2024-03-02 NOTE — Assessment & Plan Note (Signed)
 Normal TSH continue levothyroxine  125 mcg daily

## 2024-03-02 NOTE — Assessment & Plan Note (Signed)
 Follow up with Dr. Mark Sil.  Continue keppra  and course of  dexamethasone  as advised by Dr. Mark Sil.

## 2024-03-02 NOTE — Progress Notes (Signed)
 Hematology/Oncology Progress note [phone] [phone]) [phone]    CHIEF COMPLAINTS/REASON FOR VISIT:  Follow up for lung cancer  ASSESSMENT & PLAN:   Cancer Staging  Squamous cell carcinoma of right lung (HCC) Staging form: Lung, AJCC 8th Edition - Clinical stage from 09/20/2020: Stage IV (cT1b, cN2, cM1) - Signed by [name], MD on 12/13/2021   Squamous cell carcinoma of right lung (HCC) Durvalumab  was previously held due to pneumonitis.  Labs are reviewed and discussed with patient. Proceed with Durvalumab   CT scan showed no disease recurrence.   Encounter for antineoplastic immunotherapy Durvalumab  treatment as planned.   Hypokalemia IV Kcl 20meq today.  Recommend patient to start oral potassium 20meq daily  Hypothyroidism due to medication Normal TSH continue levothyroxine  125 mcg daily    Malignant neoplasm metastatic to brain [facility]) S/p SRS by Radonc Dr. Dewey.  PET brain finding suggests recurrence -NeuroOnc recommend Radiation and Bevacizumab   Discussed with [name].   MRI brain showed good response.  On Bevacizumab  monthly, with plan of 2 more doses.   Seizure (HCC) Follow up with [name].  Continue keppra  and course of  dexamethasone  as advised by [name].       Follow up  2 weeks lab MD Durvalumab  All questions were answered. The patient knows to call the clinic with any problems, questions or concerns.  Call Babara, MD, PhD [facility] Hematology Oncology 03/02/2024       HISTORY OF PRESENTING ILLNESS:  Joshua [name] is a 68 y.o. male who has above history reviewed by me today presents for follow up visit for Stage IV lung squamous cell carcinoma. Oncology History  Squamous cell carcinoma of right lung (HCC)  09/18/2020 Imaging   PET scan showed right upper lobe nodule maximal SUV 11.5.  Hypermetabolic cluster right paratracheal adenopathy with maximum SUV up to 12.  No findings of metastatic disease to the  neck/abdomen/pelvis or skeleton.   09/20/2020 Initial Diagnosis   Squamous cell carcinoma of right lung (HCC) NGS showed PD-L1 TPS 95%, LRP1B S997fs, PIK3CA E545K, RB1 c1422-2A>T, RIT1 T83_ A84del, STK11 P281fs, TPS53 R248W, TMB 19.85mut/mb, MS stable.    09/20/2020 Cancer Staging   Staging form: Lung, AJCC 8th Edition - Clinical stage from 09/20/2020: Stage IV (cT1b, cN2, cM1) - Signed by [name], MD on 12/13/2021   10/03/2020 Imaging   MRI brain with and without contrast showed no evidence of intracranial metastatic disease. Well-defined round lesion within the left parotid tail measuring approximately 2.3 x 1.8 x 1.8 cm   10/09/2020 - 11/27/2020 Radiation Therapy   concurrent chemoradiation   10/10/2020 - 10/31/2020 Chemotherapy   10/10/2020 concurrent chemoradiation.  carboplatin  AUC of 2 and Taxol  45 mg/m2 x 4.  Additional chemotherapy was held due to thrombocytopenia, neutropenia       12/31/2020 - 03/21/2022 Chemotherapy   Maintenance  Durvalumab  q14d      12/31/2020 -  Chemotherapy   Patient is on Treatment Plan : LUNG Durvalumab  (10) q14d     03/28/2021 Imaging   CT without contrast showed new linear right perihilar consolidation with associated traction bronchiectasis.  Likely postradiation changes.  Additional new peribronchial vascular/nodular groundglass opacities with areas of consolidation or seen primarily in the right upper and lower lobes, more distant from expected radiation fields. 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  Per my discussion with pulmonology Dr. 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     05/09/2021 Imaging   Repeat CT chest without contrast showed evolving radiation changes in the right perihilar region likely accounting for interval increase to thickening of the posterior wall of the right upper lobe bronchus.  The more  peripheral right lung groundglass opacities seen on most recent study have generally improved with exception of one peripherally right upper lobe which appears new.  These are likely inflammatory.  No findings highly suspicious for local recurrence.   05/30/2021 - 01/10/2022 Chemotherapy   Durvalumab  q14d     10/07/2021 Imaging   MRI brain with and without contrast showed 2 contrast-enhancing lesions, most consistent with metastatic disease.  Largest lesion is in the left frontal lobe and measures up to 1.3 cm with a large amount of surrounding edema and 9 mm rightward midline shift.  The other enhancing lesion was 4 mm in the right occipital lobe with a small amount of surrounding edema.  No acute or chronic hemorrhage.   10/07/2021 Progression   Headache after colonoscopy, progressive difficulty using her right arm.  Progressively worsening of speech. Stage IV lung cancer with brain metastasis.  started on Dexamethasone .  He was seen by neurosurgery and radiation oncology.  Recommendation is to proceed with SRS.  Patient was discharged with dexamethasone    10/16/2021 Imaging   PET scan showed postradiation changes in the right supra hilar lung without evidence of local recurrence.  No evidence of lung cancer recurrence or metastatic disease.  Single focus of intense metabolic activity associated with posterior left 11th rib likely secondary to recent fall/posttraumatic metabolic changes.  Frontal  uptake in the left frontal lobe corresponding to the known metastatic lesion in the brain   10/29/2021 -  Radiation Therapy   Brain Radiation [facility]   12/10/2021 Imaging   Brain MRI: Continued interval decrease in size of a left frontal lobe metastasis, now measuring 10 mm. Moderate surrounding edema has also decreased.  3-4 mm metastasis within the right occipital lobe, not significantly changed in size. There is now a small focus of central non enhancement within this lesion. Increased edema. 3 mm enhancing  metastasis within the high left parietal lobe. In retrospect, this was likely present as a subtle punctate focus of enhancement on the prior brain MRI of 10/22/2021.   01/18/2022 Imaging   CT chest abdomen pelvis w contrast 1. Stable post treatment changes in the right upper lobe medially with dense radiation fibrosis but no findings to suggest local recurrent tumor. 2. Significant progression of ill-defined ground-glass opacity, interstitial thickening and airspace nodularity in the right upper lobe and upper aspect of the right lower lobe. Findings could reflect an inflammatory or atypical infectious process, drug induced pneumonitis or interstitial spread of tumor.3. No mediastinal or hilar mass or adenopathy. 4. No findings for abdominal/pelvic metastatic disease or osseous metastatic disease.   02/19/2022 Imaging   MRI brain w wo contrast showed Three metastatic deposits in the brain appear smaller. Decreased edema in the left parietal lobe. No new lesions    04/04/2022 Imaging   CT chest w contrast 1. Stable radiation changes in the right perihilar region without evidence of local recurrence. 2. Interval improvement in the patchy ground-glass opacities previously demonstrated in both lungs, most consistent with a resolving infectious or inflammatory process (including immunotherapy-related pneumonitis). 3. No evidence of metastatic disease.   05/14/2022 Miscellaneous   He had a mechanical fall and fractured his left elbow. xrays showed a left elbow fracture  05/26/22 seen  by orthopedic surgeon, cast applied.    05/27/2022 Imaging   MRI brain w wo contrast 1. All three known brain metastases have enlarged since August, with increasing regional edema. And there is a small new left anterior cerebellar 4-5 mm metastasis (was questionably punctate on the May MRI). This constellation could reflect a combination of True Progression (Left Cerebellar lesion) and pseudoprogression/radionecrosis (3  existing mets).    2. No significant intracranial mass effect. 3. Underlying moderate cerebral white matter signal changes.   05/30/2022 Imaging   CT chest abdomen pelvis w contrast  1. 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 There is a stable small right pleural effusion, but no definitive findings to suggest residual or recurrent disease on today's examination. 2. No signs of metastatic disease in the abdomen or pelvis.  3. Aortic atherosclerosis, in addition to two-vessel coronary artery disease. Please note that although the presence of coronary artery calcium  documents the presence of coronary artery disease, the severity of this disease and any potential stenosis cannot be assessed on this non-gated CT examination. Assessment for potential risk factor modification, dietary therapy or pharmacologic therapy may be warranted, if clinically indicated. 4. Hepatic steatosis. 5. Colonic diverticulosis without evidence of acute diverticulitis at this time. 6. Incompletely imaged but apparently occluded vascular graft in the superior aspect of the right anterior thigh. Referral to vascular specialist for further clinical evaluation should be considered if clinically appropriate. 7. Additional incidental findings, as above.   07/22/2022 Imaging   MRI brain  1. No new or enlarging lesions. No intracranial disease progression. 2. Decreased size of treated lesions of the left frontoparietal junction and left postcentral gyrus. 3. Unchanged lesions in the left cerebellum and right occipital lobe.   10/15/2022 Imaging   MRI brain w wo contrast showed Interval increase in size of multiple intraparenchymal lesions with worsened surrounding vasogenic edema. 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 No new  lesions   11/05/2022 Imaging   MRI brain w wo contrast  1. Likely slight increase and right occipital and left cerebellar lesions. Otherwise, unchanged metastases, as above. No new lesions identified. 2. Partially imaged large probable lymph node in the left upper neck, apparent on prior PET CT.   11/13/2022 -  Radiation Therapy   Each of the sites were treated with SRS/SBRT/SRT-IMRT technique to 20 Gy in 1 fraction to each of the following sites: PTV_2_LCerebel_52mm PTV_3_RCerebel_28mm PTV_4_ROccip_36mm PTV_5_LParietal_2mm   11/27/2022 Imaging   CT chest abdomen pelvis w contrast showed  Improving bilateral scattered interstitial and ground-glass opacities. Small amount of residual areas in the extreme left lower lobe inferiorly. This specific area is slightly increased from previous but overall the amount of opacities is significantly improved. Recommend continued follow-up. No developing new mass lesion, fluid collection or lymph node enlargement. Stable posttreatment changes right perihilar in the lung. Fatty liver infiltration. Colonic diverticulosis. Improved tiny right pleural effusion   02/05/2023 Imaging   MRI brain with and without contrast Interval decrease in all of the metastatic lesions, detailed above.No new lesions identified.    Imaging   CT chest abdomen pelvis w contrast showed  1. 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 2. Trace right pleural fluid or thickening, new or minimally increased. 3. Incidental findings, including: Aortic atherosclerosis (ICD10-I70.0), coronary artery atherosclerosis and emphysema (ICD10-J43.9). Hepatic steatosis.  06/25/2023 Imaging   CT chest abdomen pelvis w contrast showed 1. Unchanged post treatment/post radiation appearance of the right lung, with perihilar and suprahilar paramedian fibrosis and consolidation. 2. No evidence of lymphadenopathy or metastatic disease  in the chest, abdomen, or pelvis. 3. Hepatic steatosis. 4. Coronary artery disease.   Aortic Atherosclerosis (ICD10-I70.0) and Emphysema (ICD10-J43.9).   08/26/2023 [facility] Admission   hospitalization due to was observed speech and jerking movements on right side despite being on dexamethasone . Continuous EEG suggest cortical dysfunction arising from left temporal region.  No seizure or elective form discharges were seen. Patient was discharged on dexamethasone  4 mg, Keppra , lamotrigine .    11/04/2023 -  Chemotherapy   Add Bevacizumab  5mg /kg to Durvalumab .     11/17/2023 - 11/27/2023 Radiation Therapy   Patient received radiation to brain for treatment of recurrence.   02/23/2024 Imaging   CT chest abdomen pelvis w contrast showed  CHEST:   1. Post RIGHT upper lobe resection. 2. No evidence of lung cancer recurrence. 3. No mediastinal adenopathy.   PELVIS:   1. No evidence of metastatic disease in the abdomen pelvis. 2. No skeletal metastasis.     INTERVAL HISTORY Joshua [name] is a 68 y.o. male who has above history reviewed by me today presents for follow up visit for right lung RCC.  chronic SOB,not worse.  Right lower extremity wound- he follows up with wound care Nausea has improved.    Review of Systems  Constitutional:  Positive for fatigue. Negative for appetite change, chills, fever and unexpected weight change.  HENT:   Negative for hearing loss and voice change.   Eyes:  Negative for eye problems and icterus.  Respiratory:  Negative for chest tightness, cough and shortness of breath.   Cardiovascular:  Positive for leg swelling. Negative for chest pain.  Gastrointestinal:  Negative for abdominal distention, abdominal pain, nausea and vomiting.  Endocrine: Negative for hot flashes.  Genitourinary:  Negative for difficulty urinating, dysuria and frequency.   Musculoskeletal:  Positive for gait problem. Negative for arthralgias.       Right big toe wound   Skin:  Negative for itching and rash.  Neurological:  Positive for gait problem. Negative for extremity weakness, headaches, numbness and speech difficulty.  Hematological:  Negative for adenopathy. Bruises/bleeds easily.  Psychiatric/Behavioral:  Negative for confusion.       MEDICAL HISTORY:  Past Medical History:  Diagnosis Date   Allergy    Cancer (HCC)    lung   COPD (chronic obstructive pulmonary disease) (HCC)    Dyspnea    former smoker. 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   Malignant neoplasm metastatic to brain [facility])    Nausea with vomiting 02/01/2024   Peripheral vascular disease (HCC)    Pre-diabetes    Sleep apnea     SURGICAL HISTORY: Past Surgical History:  Procedure Laterality Date   ANTERIOR CERVICAL DECOMP/DISCECTOMY FUSION N/A 03/14/2020   Procedure: ANTERIOR CERVICAL DECOMPRESSION/DISCECTOMY FUSION 3 LEVELS C4-7;  Surgeon: Clois Fret, MD;  Location: [facility] ORS;  Service: Neurosurgery;  Laterality: N/A;   BACK SURGERY  2011   CENTRAL LINE INSERTION Right 09/10/2016   Procedure: CENTRAL LINE INSERTION;  Surgeon: Jama Cordella MATSU, MD;  Location: [facility] ORS;  Service: Vascular;  Laterality: Right;   CERVICAL FUSION     C4-c7 on Mar 14, 2020   COLONOSCOPY  2013 ?   COLONOSCOPY  N/A 09/09/2021   Procedure: COLONOSCOPY;  Surgeon: Onita Elspeth Sharper, DO;  Location: [facility] ENDOSCOPY;  Service: Gastroenterology;  Laterality: N/A;   CORONARY ANGIOPLASTY     ESOPHAGOGASTRODUODENOSCOPY N/A 09/09/2021   Procedure: ESOPHAGOGASTRODUODENOSCOPY (EGD);  Surgeon: Onita Elspeth Sharper, DO;  Location: [facility] ENDOSCOPY;  Service: Gastroenterology;  Laterality: N/A;   FEMORAL-POPLITEAL BYPASS GRAFT Right 09/10/2016   Procedure: BYPASS GRAFT FEMORAL-POPLITEAL ARTERY ( BELOW KNEE );  Surgeon: Jama Cordella MATSU, MD;  Location: [facility] ORS;  Service: Vascular;  Laterality: Right;   FEMORAL-TIBIAL BYPASS GRAFT Right  03/23/2020   Procedure: BYPASS GRAFT RIGHT FEMORAL- DISTAL TIBIAL ARTERY WITH DISTAL FLOW GRAFT;  Surgeon: Jama Cordella MATSU, MD;  Location: [facility] ORS;  Service: Vascular;  Laterality: Right;   LEFT HEART CATH AND CORONARY ANGIOGRAPHY Left 05/16/2021   Procedure: LEFT HEART CATH AND CORONARY ANGIOGRAPHY;  Surgeon: Lawyer Bernardino Cough, MD;  Location: [facility] INVASIVE CV LAB;  Service: Cardiovascular;  Laterality: Left;   LOWER EXTREMITY ANGIOGRAPHY Right 11/18/2016   Procedure: Lower Extremity Angiography;  Surgeon: Cordella MATSU Jama, MD;  Location: [facility] INVASIVE CV LAB;  Service: Cardiovascular;  Laterality: Right;   LOWER EXTREMITY ANGIOGRAPHY Right 12/09/2016   Procedure: Lower Extremity Angiography;  Surgeon: Jama Cordella MATSU, MD;  Location: [facility] INVASIVE CV LAB;  Service: Cardiovascular;  Laterality: Right;   LOWER EXTREMITY ANGIOGRAPHY Right 11/15/2019   Procedure: LOWER EXTREMITY ANGIOGRAPHY;  Surgeon: Jama Cordella MATSU, MD;  Location: [facility] INVASIVE CV LAB;  Service: Cardiovascular;  Laterality: Right;   LOWER EXTREMITY ANGIOGRAPHY Right 12/01/2019   Procedure: Lower Extremity Angiography;  Surgeon: Marea Selinda RAMAN, MD;  Location: [facility] INVASIVE CV LAB;  Service: Cardiovascular;  Laterality: Right;   LOWER EXTREMITY ANGIOGRAPHY Right 12/02/2019   Procedure: LOWER EXTREMITY ANGIOGRAPHY;  Surgeon: Jama Cordella MATSU, MD;  Location: [facility] INVASIVE CV LAB;  Service: Cardiovascular;  Laterality: Right;   LOWER EXTREMITY ANGIOGRAPHY Right 03/23/2020   Procedure: Lower Extremity Angiography;  Surgeon: Jama Cordella MATSU, MD;  Location: [facility] INVASIVE CV LAB;  Service: Cardiovascular;  Laterality: Right;   LOWER EXTREMITY ANGIOGRAPHY Right 09/25/2021   Procedure: Lower Extremity Angiography;  Surgeon: Jama Cordella MATSU, MD;  Location: [facility] INVASIVE CV LAB;  Service: Cardiovascular;  Laterality: Right;   LOWER EXTREMITY ANGIOGRAPHY Right 09/26/2021   Procedure: Lower Extremity Angiography;  Surgeon: Marea Selinda RAMAN, MD;   Location: [facility] INVASIVE CV LAB;  Service: Cardiovascular;  Laterality: Right;   LOWER EXTREMITY ANGIOGRAPHY Right 04/08/2022   Procedure: Lower Extremity Angiography;  Surgeon: Jama Cordella MATSU, MD;  Location: [facility] INVASIVE CV LAB;  Service: Cardiovascular;  Laterality: Right;   LOWER EXTREMITY INTERVENTION  11/18/2016   Procedure: Lower Extremity Intervention;  Surgeon: Cordella MATSU Jama, MD;  Location: [facility] INVASIVE CV LAB;  Service: Cardiovascular;;   PERIPHERAL VASCULAR CATHETERIZATION Right 01/02/2015   Procedure: Lower Extremity Angiography;  Surgeon: Cordella MATSU Jama, MD;  Location: [facility] INVASIVE CV LAB;  Service: Cardiovascular;  Laterality: Right;   PERIPHERAL VASCULAR CATHETERIZATION Right 06/18/2015   Procedure: Lower Extremity Angiography;  Surgeon: Selinda RAMAN Marea, MD;  Location: [facility] INVASIVE CV LAB;  Service: Cardiovascular;  Laterality: Right;   PERIPHERAL VASCULAR CATHETERIZATION  06/18/2015   Procedure: Lower Extremity Intervention;  Surgeon: Selinda RAMAN Marea, MD;  Location: [facility] INVASIVE CV LAB;  Service: Cardiovascular;;   PERIPHERAL VASCULAR CATHETERIZATION N/A 10/09/2015   Procedure: Abdominal Aortogram w/Lower Extremity;  Surgeon: Cordella MATSU Jama, MD;  Location: [facility] INVASIVE CV LAB;  Service: Cardiovascular;  Laterality: N/A;   PERIPHERAL VASCULAR CATHETERIZATION  10/09/2015  Procedure: Lower Extremity Intervention;  Surgeon: Cordella KANDICE Shawl, MD;  Location: [facility] INVASIVE CV LAB;  Service: Cardiovascular;;   PERIPHERAL VASCULAR CATHETERIZATION Right 10/10/2015   Procedure: Lower Extremity Angiography;  Surgeon: Selinda GORMAN Gu, MD;  Location: [facility] INVASIVE CV LAB;  Service: Cardiovascular;  Laterality: Right;   PERIPHERAL VASCULAR CATHETERIZATION Right 10/10/2015   Procedure: Lower Extremity Intervention;  Surgeon: Selinda GORMAN Gu, MD;  Location: [facility] INVASIVE CV LAB;  Service: Cardiovascular;  Laterality: Right;   PERIPHERAL VASCULAR CATHETERIZATION Right 12/03/2015   Procedure: Lower  Extremity Angiography;  Surgeon: Selinda GORMAN Gu, MD;  Location: [facility] INVASIVE CV LAB;  Service: Cardiovascular;  Laterality: Right;   PERIPHERAL VASCULAR CATHETERIZATION Right 12/04/2015   Procedure: Lower Extremity Angiography;  Surgeon: Selinda GORMAN Gu, MD;  Location: [facility] INVASIVE [facility] LAB;  Service: Cardiovascular;  Laterality: Right;   PERIPHERAL VASCULAR CATHETERIZATION  12/04/2015   Procedure: Lower Extremity Intervention;  Surgeon: Selinda GORMAN Gu, MD;  Location: [facility] INVASIVE CV LAB;  Service: Cardiovascular;;   PERIPHERAL VASCULAR CATHETERIZATION Right 06/30/2016   Procedure: Lower Extremity Angiography;  Surgeon: Selinda GORMAN Gu, MD;  Location: [facility] INVASIVE CV LAB;  Service: Cardiovascular;  Laterality: Right;   PERIPHERAL VASCULAR CATHETERIZATION Right 06/25/2016   Procedure: Lower Extremity Angiography;  Surgeon: Cordella KANDICE Shawl, MD;  Location: [facility] INVASIVE CV LAB;  Service: Cardiovascular;  Laterality: Right;   PERIPHERAL VASCULAR CATHETERIZATION Right 07/01/2016   Procedure: Lower Extremity Angiography;  Surgeon: Cordella KANDICE Shawl, MD;  Location: [facility] INVASIVE CV LAB;  Service: Cardiovascular;  Laterality: Right;   PERIPHERAL VASCULAR CATHETERIZATION  07/01/2016   Procedure: Lower Extremity Intervention;  Surgeon: Cordella KANDICE Shawl, MD;  Location: [facility] INVASIVE CV LAB;  Service: Cardiovascular;;   PERIPHERAL VASCULAR CATHETERIZATION Right 08/01/2016   Procedure: Lower Extremity Angiography;  Surgeon: Cordella KANDICE Shawl, MD;  Location: [facility] INVASIVE CV LAB;  Service: Cardiovascular;  Laterality: Right;   PORTA CATH INSERTION N/A 10/05/2020   Procedure: PORTA CATH INSERTION;  Surgeon: Shawl Cordella KANDICE, MD;  Location: [facility] INVASIVE CV LAB;  Service: Cardiovascular;  Laterality: N/A;   stent placement in right leg Right    VIDEO BRONCHOSCOPY N/A 09/14/2020   Procedure: VIDEO BRONCHOSCOPY WITH FLUORO;  Surgeon: Tamea Dedra CROME, MD;  Location: [facility] ORS;  Service: Cardiopulmonary;  Laterality: N/A;    VIDEO BRONCHOSCOPY WITH ENDOBRONCHIAL ULTRASOUND N/A 09/14/2020   Procedure: VIDEO BRONCHOSCOPY WITH ENDOBRONCHIAL ULTRASOUND;  Surgeon: Tamea Dedra CROME, MD;  Location: [facility] ORS;  Service: Cardiopulmonary;  Laterality: N/A;    SOCIAL HISTORY: Social History   Socioeconomic History   Marital status: Single    Spouse name: Not on file   Number of children: Not on file   Years of education: Not on file   Highest education level: Not on file  Occupational History   Occupation: unemployed  Tobacco Use   Smoking status: Former    Current packs/day: 0.00    Average packs/day: 2.0 packs/day for 38.0 years (76.0 ttl pk-yrs)    Types: Cigarettes    Start date: 12/19/1976    Quit date: 12/20/2014    Years since quitting: 9.2   Smokeless tobacco: Never   Tobacco comments:    quit smoking in 2017 most smoked 2   Vaping Use   Vaping status: Former  Substance and Sexual Activity   Alcohol  use: Not Currently   Drug use: No   Sexual activity: Yes  Other Topics Concern   Not on file  Social History Narrative   Not on file  Social Drivers of Health   Financial Resource Strain: Medium Risk (02/05/2024)   Overall Financial Resource Strain (CARDIA)    Difficulty of Paying Living Expenses: Somewhat hard  Food Insecurity: No Food Insecurity (02/05/2024)   Hunger Vital Sign    Worried About Running Out of Food in the Last Year: Never true    Ran Out of Food in the Last Year: Never true  Transportation Needs: No Transportation Needs (02/05/2024)   PRAPARE - Administrator, Civil Service (Medical): No    Lack of Transportation (Non-Medical): No  Physical Activity: Inactive (02/05/2024)   Exercise Vital Sign    Days of Exercise per Week: 1 day    Minutes of Exercise per Session: 0 min  Stress: No Stress Concern Present (02/05/2024)   [employer] of Occupational Health - Occupational Stress Questionnaire    Feeling of Stress: Only a little  Social Connections: Socially Isolated  (02/05/2024)   Social Connection and Isolation Panel    Frequency of Communication with Friends and Family: Three times a week    Frequency of Social Gatherings with Friends and Family: More than three times a week    Attends Religious Services: Never    Database administrator or Organizations: No    Attends Engineer, structural: Not on file    Marital Status: Never married  Intimate Partner Violence: Not At Risk (12/18/2023)   Humiliation, Afraid, Rape, and Kick questionnaire    Fear of Current or Ex-Partner: No    Emotionally Abused: No    Physically Abused: No    Sexually Abused: No    FAMILY HISTORY: Family History  Problem Relation Age of Onset   Diabetes Mother    Hypertension Mother    Heart murmur Mother    Leukemia Mother    Throat cancer Maternal Grandmother    Colon cancer Neg Hx    Breast cancer Neg Hx     ALLERGIES:  has no known allergies.  MEDICATIONS:  Current Outpatient Medications  Medication Sig Dispense Refill   aspirin  EC 81 MG tablet Take 81 mg by mouth daily. Swallow whole.     clopidogrel  (PLAVIX ) 75 MG tablet TAKE 1 TABLET BY MOUTH DAILY 100 tablet 2   levETIRAcetam  (KEPPRA ) 1000 MG tablet Take 1 tablet (1,000 mg total) by mouth 2 (two) times daily. 60 tablet 2   levothyroxine  (SYNTHROID ) 125 MCG tablet Take 1 tablet (125 mcg total) by mouth daily before breakfast. 90 tablet 0   mupirocin  ointment (BACTROBAN ) 2 % Apply 1 Application topically daily. 22 g 5   potassium chloride  SA (KLOR-CON  M) 20 MEQ tablet Take 1 tablet (20 mEq total) by mouth daily. 30 tablet 0   rosuvastatin  (CRESTOR ) 40 MG tablet Take 1 tablet (40 mg total) by mouth daily. (Patient not taking: Reported on 03/02/2024) 90 tablet 3   No current facility-administered medications for this visit.   Facility-Administered Medications Ordered in Other Visits  Medication Dose Route Frequency Provider Last Rate Last Admin   albuterol  (PROVENTIL ) (2.5 MG/3ML) 0.083% nebulizer  solution 2.5 mg  2.5 mg Nebulization Once Tamea Dedra CROME, MD       bevacizumab -awwb (MVASI ) 900 mg in sodium chloride  0.9 % 100 mL chemo infusion  10 mg/kg (Treatment Plan Recorded) Intravenous Once [name], MD       durvalumab  (IMFINZI ) 860 mg in sodium chloride  0.9 % 100 mL chemo infusion  10 mg/kg (Treatment Plan Recorded) Intravenous Once [name], MD 117 mL/hr  at 03/02/24 1008 860 mg at 03/02/24 1008   heparin  lock flush 100 unit/mL  500 Units Intracatheter Once PRN [name], MD         PHYSICAL EXAMINATION: ECOG PERFORMANCE STATUS: 1 - Symptomatic but completely ambulatory Vitals:   03/02/24 0830  BP: 109/76  Pulse: 99  Resp: 18  Temp: 98.5 F (36.9 C)  SpO2: 100%    Filed Weights   03/02/24 0830  Weight: 191 lb 11.2 oz (87 kg)     Physical Exam Constitutional:      General: He is not in acute distress.    Appearance: He is obese.  HENT:     Head: Normocephalic and atraumatic.  Eyes:     General: No scleral icterus. Cardiovascular:     Rate and Rhythm: Normal rate and regular rhythm.  Pulmonary:     Effort: Pulmonary effort is normal. No respiratory distress.     Breath sounds: Normal breath sounds. No wheezing.  Abdominal:     General: There is no distension.     Palpations: Abdomen is soft.     Tenderness: There is no abdominal tenderness.  Musculoskeletal:     Cervical back: Normal range of motion and neck supple.     Comments: Right lower extremity wrapped.   Skin:    General: Skin is warm and dry.     Findings: Bruising present. No erythema.  Neurological:     Mental Status: He is alert and oriented to person, place, and time. Mental status is at baseline.     Cranial Nerves: No cranial nerve deficit.     Motor: No abnormal muscle tone.  Psychiatric:        Mood and Affect: Mood and affect normal.         LABORATORY DATA:  I have reviewed the data as listed    Latest Ref Rng & Units 03/02/2024    8:08 AM 02/15/2024    8:33 AM 02/01/2024     8:23 AM  CBC  WBC 4.0 - 10.5 K/uL 4.6  5.8  5.7   Hemoglobin 13.0 - 17.0 g/dL 86.8  86.4  86.9   Hematocrit 39.0 - 52.0 % 40.9  42.6  41.5   Platelets 150 - 400 K/uL 247  286  249       Latest Ref Rng & Units 03/02/2024    8:08 AM 02/15/2024    8:33 AM 02/08/2024    1:09 PM  CMP  Glucose 70 - 99 mg/dL 885  888  867   BUN 8 - 23 mg/dL 6  13  10    Creatinine 0.61 - 1.24 mg/dL 9.12  9.01  9.06   Sodium 135 - 145 mmol/L 140  138  137   Potassium 3.5 - 5.1 mmol/L 3.6  3.9  4.2   Chloride 98 - 111 mmol/L 106  102  106   CO2 22 - 32 mmol/L 25  21  23    Calcium  8.9 - 10.3 mg/dL 8.9  9.0  9.0   Total Protein 6.5 - 8.1 g/dL 6.2  6.7    Total Bilirubin 0.0 - 1.2 mg/dL 0.8  0.8    Alkaline Phos 38 - 126 U/L 76  73    AST 15 - 41 U/L 28  37    ALT 0 - 44 U/L 12  17       RADIOGRAPHIC STUDIES: I have personally reviewed the radiological images as listed and agreed with the findings in the  report. CT CHEST ABDOMEN PELVIS W CONTRAST Result Date: 03/01/2024 CLINICAL DATA:  Lung cancer. Assess treatment response. * Tracking Code: BO * EXAM: CT CHEST, ABDOMEN, AND PELVIS WITH CONTRAST TECHNIQUE: Multidetector CT imaging of the chest, abdomen and pelvis was performed following the standard protocol during bolus administration of intravenous contrast. RADIATION DOSE REDUCTION: This exam was performed according to the departmental dose-optimization program which includes automated exposure control, adjustment of the mA and/or kV according to patient size and/or use of iterative reconstruction technique. CONTRAST:  OMNIPAQUE  IOHEXOL  300 MG/ML  SOLN COMPARISON:  CT 526 1,025 com PET-CT 2000 FINDINGS: CT CHEST FINDINGS Cardiovascular: Port in the anterior chest wall with tip in distal SVC. No significant vascular findings. Normal heart size. No pericardial effusion. Mediastinum/Nodes: No axillary or supraclavicular adenopathy. No mediastinal or hilar adenopathy. No pericardial fluid. Esophagus normal.  Lungs/Pleura: Post RIGHT upper lobe resection. RIGHT suprahilar peribronchial thickening unchanged from prior. No new nodularity the RIGHT lung. LEFT lung clear. Musculoskeletal: No aggressive osseous lesion. CT ABDOMEN AND PELVIS FINDINGS Hepatobiliary: No focal hepatic lesion. No biliary ductal dilatation. Gallbladder is normal. Common bile duct is normal. Pancreas: Pancreas is normal. No ductal dilatation. No pancreatic inflammation. Spleen: Normal spleen Adrenals/urinary tract: Adrenal glands and kidneys are normal. The ureters and bladder normal. Stomach/Bowel: Stomach, small bowel, appendix, and cecum are normal. Multiple diverticula of the descending colon and sigmoid colon without acute inflammation. Vascular/Lymphatic: Abdominal aorta is normal caliber. There is no retroperitoneal or periportal lymphadenopathy. No pelvic lymphadenopathy. Reproductive: Unremarkable Other: No free fluid. Musculoskeletal: No aggressive osseous lesion. IMPRESSION: CHEST: 1. Post RIGHT upper lobe resection. 2. No evidence of lung cancer recurrence. 3. No mediastinal adenopathy. PELVIS: 1. No evidence of metastatic disease in the abdomen pelvis. 2. No skeletal metastasis. Electronically Signed   By: Jackquline Boxer M.D.   On: 03/01/2024 16:37   VAS US  ABI WITH/WO TBI Result Date: 02/29/2024  LOWER EXTREMITY DOPPLER STUDY Patient Name:  Joshua [name]  Date of Exam:   02/25/2024 Medical Rec #: [id number]            Accession #:    [id number] Date of Birth: [birth year]           Patient Gender: M Patient Age:   83 years Exam Location:  [facility] Procedure:      VAS US  ABI WITH/WO TBI Referring Phys: --------------------------------------------------------------------------------  Indications: Ulceration, peripheral artery disease, and Routine f/u. Patient              reports no claudication or lower extremity pain. High Risk Factors: Hyperlipidemia, past history of smoking. Other Factors: ASO s/p multiple  interventions and bypass.                11/15/2019 Right thrombectomy of Fempop insitu graft with added                stent throughout.                 12/01/2019: Aortogram and Selective Right Lower Extremity                Angiogram. 6 mg of TPA delivered in a lysis catheter the Origin                of the Right Femoral -Popliteal bypass down the proximal ATA with                placement of the lysis.  12/02/2019: PTA of the Right Posterior Tibial Artery. PTA of the                Right Peroneal Artery. Mechanical Thrombectomy of the Right                Femoral -Popliteal Bypass graft. Mechanical Thrombectomy of the                Right Tibioperoneal Trunk and Posterior Tibial Artery.                 09/25/2021: Right Lower Extremity Angiography third order                catheter placement. PTA of the proximal peroneal and                tibioperoneal trunk to 2.5 mm with an ultra score balloon. PTA of                the Femoral Artery and the proximal anastomosis of the bypass                graft to 4 mm with an ultra score balloon. Administration of 5 mg                of tpa throughout the entire bypass and in the peroneal Artery.                Initiation of continuous tpa infusion Right lower Extremity for                limb salvage. Insertion of a triple lumen catheter Left CFV with                US  and fluoroscopic guidance.                 09/26/2021: Right LE Angiogram. PTA of the Right Posterior Tibial                Artery with 2.5 mm diameter by 30 cm length angioplasty balloon.                PTA of the distal bypass anastomosis and distal bypass graft                analogous to the Popliteal artery with 4 mm diameter by 8 cm                length Lutonix drug coated angioplasty balloon.                04/08/2022 right thrombectomy of the Deep profunda.  Vascular Interventions: Hx of multiple right lower extremity PTA's/stents.                         09/10/2016 Right femoral to  below the knee popliteal                         artery bypass graft placement. 5/01 Right bypass graft &                         mid popliteal artery PTA & coil embolectomy of large                         branch of bypass graft. 12/09/16 Right distal bypass  graft PTA/stent and right posterior tibial artery PTA.                         03/23/2020 Rt PTA thrombectomy. Rt Profunda fem artery to                         PTA bypass. Performing Technologist: Donnice Charnley RVT  Examination Guidelines: A complete evaluation includes at minimum, Doppler waveform signals and systolic blood pressure reading at the level of bilateral brachial, anterior tibial, and posterior tibial arteries, when vessel segments are accessible. Bilateral testing is considered an integral part of a complete examination. Photoelectric Plethysmograph (PPG) waveforms and toe systolic pressure readings are included as required and additional duplex testing as needed. Limited examinations for reoccurring indications may be performed as noted.  ABI Findings: +---------+------------------+-----+----------+--------+ Right    Rt Pressure (mmHg)IndexWaveform  Comment  +---------+------------------+-----+----------+--------+ Brachial 167                                       +---------+------------------+-----+----------+--------+ PTA      61                0.37 monophasic         +---------+------------------+-----+----------+--------+ DP       54                0.32 monophasic         +---------+------------------+-----+----------+--------+ Great Toe43                0.26                    +---------+------------------+-----+----------+--------+ +---------+------------------+-----+---------+-------+ Left     Lt Pressure (mmHg)IndexWaveform Comment +---------+------------------+-----+---------+-------+ Brachial 164                                      +---------+------------------+-----+---------+-------+ PTA      165               0.99 biphasic         +---------+------------------+-----+---------+-------+ DP       189               1.13 triphasic        +---------+------------------+-----+---------+-------+ Great Toe125               0.75                  +---------+------------------+-----+---------+-------+ +-------+-----------+-----------+------------+------------+ ABI/TBIToday's ABIToday's TBIPrevious ABIPrevious TBI +-------+-----------+-----------+------------+------------+ Right  0.37       0.26       0.44        0.24         +-------+-----------+-----------+------------+------------+ Left   1.13       0.75       1.13        0.61         +-------+-----------+-----------+------------+------------+ Bilateral ABIs appear essentially unchanged compared to prior study on 07/09/2023.  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 The right toe-brachial index is abnormal. Left: Resting left ankle-brachial index is within normal range. The left toe-brachial index is normal. *See table(s) above for measurements and observations.  Electronically signed by [name] MD on 02/29/2024 at 7:39:27 AM.  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 EXAM: MRI HEAD WITHOUT AND WITH CONTRAST TECHNIQUE: Multiplanar, multiecho pulse sequences of the brain and surrounding structures were obtained without and with intravenous contrast. CONTRAST:  9mL GADAVIST  GADOBUTROL  1 MMOL/ML IV SOLN COMPARISON:  MRI of the brain dated November 06, 2023. FINDINGS: Brain: A centrally necrotic, peripherally enhancing lesion within the left parietal lobe has decreased in size in the interim from approximately 3.5 x 2.9 x 2.4 cm to approximately 2.7 x 2.0 x 1.8 cm. A lesion within the left  frontal operculum as marginally decreased in size in the interim from 1.4 x 1.4 x 1.6 cm to approximately 1.2 x 1.2 x 1.3 cm. A and nodule previously noted within the right occipital lobe has also marginally in decrease in size in the interim from 8 mm in diameter to 7 mm. The linear area of enhancement previously noted along the left inferior parietal lobe appears represent a vessel. There are no new or enlarging lesions present. There is moderate periventricular and deep cerebral white matter disease. There is gliosis also noted adjacent to the necrotic lesion within the left parietal lobe. Vascular: Normal vascular flow voids. Skull and upper cervical spine: No osseous lesions are evident. Normal bone marrow signal. Sinuses/Orbits: Mild mucosal disease within the right maxillary sinus. The orbits are unremarkable. Other: None. IMPRESSION: 1. Interval improvement of metastatic disease within the left parietal, left frontal and right occipital lobes. Electronically Signed   By: Evalene Coho M.D.   On: 01/04/2024 13:17   DG Foot 2 Views Right Result Date: 12/25/2023 CLINICAL DATA:  Foot wound EXAM: RIGHT FOOT - 2 VIEW COMPARISON:  None Available. FINDINGS: There is no evidence of fracture or dislocation. There is no evidence of arthropathy or other focal bone abnormality. Soft tissues soft tissue swelling of the dorsum IMPRESSION: Soft tissue swelling of the dorsum of the foot. Electronically Signed   By: Franky Chard M.D.   On: 12/25/2023 13:05   DG Foot 2 Views Right Result Date: 12/17/2023 CLINICAL DATA:  Cellulitis. Right foot pain for 1 month. Wound to big toe. Redness and swelling. EXAM: RIGHT FOOT - 2 VIEW COMPARISON:  None Available. FINDINGS: Generalized soft tissue edema. Site of wound is not well delineated by radiograph. No erosive or bony destructive change. No fracture. Mild degenerative change of the first metatarsal phalangeal joint. No soft tissue gas or radiopaque foreign body. Small  plantar calcaneal spur. IMPRESSION: 1. Generalized soft tissue edema. No radiographic evidence of osteomyelitis. 2. Mild degenerative change of the first metatarsophalangeal joint. Electronically Signed   By: Andrea Gasman M.D.   On: 12/17/2023 23:29   DG Chest 2 View Result Date: 12/17/2023 CLINICAL DATA:  Suspected sepsis EXAM: CHEST - 2 VIEW COMPARISON:  02/10/2022 FINDINGS: Right Port-A-Cath remains in place, unchanged. Heart mediastinal contours within normal limits. No acute confluent airspace opacities or effusions. Post treatment scarring noted in the right suprahilar region, unchanged. No acute bony abnormality. IMPRESSION: No active cardiopulmonary disease. Electronically Signed   By: Franky Crease M.D.   On: 12/17/2023 21:05

## 2024-03-02 NOTE — Assessment & Plan Note (Signed)
Durvalumab treatment as planned.  

## 2024-03-02 NOTE — Assessment & Plan Note (Signed)
 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 Dewey.  PET brain finding suggests recurrence -NeuroOnc recommend Radiation and Bevacizumab   Discussed with [name].   MRI brain showed good response.  On Bevacizumab  monthly, with plan of 2 more doses.

## 2024-03-02 NOTE — Assessment & Plan Note (Signed)
 IV Kcl 20meq today.  Recommend patient to start oral potassium 20meq daily

## 2024-03-04 ENCOUNTER — Ambulatory Visit
Admission: RE | Admit: 2024-03-04 | Discharge: 2024-03-04 | Disposition: A | Discharge status: Home / Self Care | Attending: Vascular Surgery | Admitting: Vascular Surgery

## 2024-03-04 ENCOUNTER — Encounter: Payer: Self-pay | Admitting: Vascular Surgery

## 2024-03-04 ENCOUNTER — Encounter
Admission: RE | Disposition: A | Discharge status: Home / Self Care | Payer: Self-pay | Source: Home / Self Care | Attending: Vascular Surgery

## 2024-03-04 DIAGNOSIS — I70235 Atherosclerosis of native arteries of right leg with ulceration of other part of foot: Secondary | ICD-10-CM | POA: Diagnosis not present

## 2024-03-04 DIAGNOSIS — Z9889 Other specified postprocedural states: Secondary | ICD-10-CM

## 2024-03-04 DIAGNOSIS — L97511 Non-pressure chronic ulcer of other part of right foot limited to breakdown of skin: Secondary | ICD-10-CM | POA: Insufficient documentation

## 2024-03-04 DIAGNOSIS — Z87891 Personal history of nicotine dependence: Secondary | ICD-10-CM | POA: Insufficient documentation

## 2024-03-04 DIAGNOSIS — I70233 Atherosclerosis of native arteries of right leg with ulceration of ankle: Secondary | ICD-10-CM | POA: Diagnosis not present

## 2024-03-04 DIAGNOSIS — E782 Mixed hyperlipidemia: Secondary | ICD-10-CM | POA: Diagnosis not present

## 2024-03-04 DIAGNOSIS — L97519 Non-pressure chronic ulcer of other part of right foot with unspecified severity: Secondary | ICD-10-CM | POA: Diagnosis not present

## 2024-03-04 DIAGNOSIS — M5416 Radiculopathy, lumbar region: Secondary | ICD-10-CM | POA: Insufficient documentation

## 2024-03-04 DIAGNOSIS — I70299 Other atherosclerosis of native arteries of extremities, unspecified extremity: Secondary | ICD-10-CM | POA: Diagnosis present

## 2024-03-04 DIAGNOSIS — I70221 Atherosclerosis of native arteries of extremities with rest pain, right leg: Secondary | ICD-10-CM

## 2024-03-04 DIAGNOSIS — Z79899 Other long term (current) drug therapy: Secondary | ICD-10-CM | POA: Insufficient documentation

## 2024-03-04 DIAGNOSIS — J449 Chronic obstructive pulmonary disease, unspecified: Secondary | ICD-10-CM | POA: Diagnosis not present

## 2024-03-04 DIAGNOSIS — I708 Atherosclerosis of other arteries: Secondary | ICD-10-CM | POA: Diagnosis not present

## 2024-03-04 DIAGNOSIS — L97319 Non-pressure chronic ulcer of right ankle with unspecified severity: Secondary | ICD-10-CM | POA: Insufficient documentation

## 2024-03-04 DIAGNOSIS — I70335 Atherosclerosis of unspecified type of bypass graft(s) of the right leg with ulceration of other part of foot: Secondary | ICD-10-CM

## 2024-03-04 DIAGNOSIS — T82858A Stenosis of vascular prosthetic devices, implants and grafts, initial encounter: Secondary | ICD-10-CM | POA: Diagnosis not present

## 2024-03-04 DIAGNOSIS — L97909 Non-pressure chronic ulcer of unspecified part of unspecified lower leg with unspecified severity: Secondary | ICD-10-CM

## 2024-03-04 HISTORY — PX: LOWER EXTREMITY ANGIOGRAPHY: CATH118251

## 2024-03-04 HISTORY — PX: LOWER EXTREMITY INTERVENTION: CATH118252

## 2024-03-04 LAB — CREATININE, SERUM
Creatinine, Ser: 0.93 mg/dL (ref 0.61–1.24)
GFR, Estimated: >60 mL/min (ref >60)

## 2024-03-04 LAB — BUN: BUN: 9 mg/dL (ref 8–23)

## 2024-03-04 SURGERY — LOWER EXTREMITY INTERVENTION
Anesthesia: Moderate Sedation | Site: Leg Lower | Laterality: Right

## 2024-03-04 MED ORDER — OXYCODONE HCL 5 MG PO TABS: 5.0000 mg | ORAL_TABLET | ORAL | Status: DC | PRN

## 2024-03-04 MED ORDER — FENTANYL CITRATE (PF) 100 MCG/2ML IJ SOLN
INTRAMUSCULAR | Status: DC | PRN
  Administered 2024-03-04: 12.5 ug via INTRAVENOUS
  Administered 2024-03-04: 25 ug via INTRAVENOUS
  Administered 2024-03-04: 50 ug via INTRAVENOUS

## 2024-03-04 MED ORDER — CEFAZOLIN SODIUM-DEXTROSE 2-4 GM/100ML-% IV SOLN
INTRAVENOUS | Status: AC
  Filled 2024-03-04: qty 100

## 2024-03-04 MED ORDER — HYDROMORPHONE HCL 1 MG/ML IJ SOLN: 1.0000 mg | Freq: Once | INTRAMUSCULAR | Status: DC | PRN

## 2024-03-04 MED ORDER — SODIUM CHLORIDE 0.9 % IV SOLN: 250.0000 mL | INTRAVENOUS | Status: DC | PRN

## 2024-03-04 MED ORDER — HEPARIN (PORCINE) IN NACL 2000-0.9 UNIT/L-% IV SOLN
INTRAVENOUS | Status: DC | PRN
  Administered 2024-03-04: 1000 mL

## 2024-03-04 MED ORDER — ONDANSETRON HCL 4 MG/2ML IJ SOLN: 4.0000 mg | Freq: Four times a day (QID) | INTRAMUSCULAR | Status: DC | PRN

## 2024-03-04 MED ORDER — MIDAZOLAM HCL 2 MG/2ML IJ SOLN
INTRAMUSCULAR | Status: AC
  Filled 2024-03-04: qty 2

## 2024-03-04 MED ORDER — HEPARIN (PORCINE) IN NACL 1000-0.9 UT/500ML-% IV SOLN
INTRAVENOUS | Status: DC | PRN
Start: 2024-03-04 — End: 2024-03-04
  Administered 2024-03-04: 1000 mL

## 2024-03-04 MED ORDER — FAMOTIDINE 20 MG PO TABS: 40.0000 mg | ORAL_TABLET | Freq: Once | ORAL | Status: DC | PRN

## 2024-03-04 MED ORDER — HEPARIN SODIUM (PORCINE) 1000 UNIT/ML IJ SOLN
INTRAMUSCULAR | Status: AC
  Filled 2024-03-04: qty 10

## 2024-03-04 MED ORDER — SODIUM CHLORIDE 0.9 % IV SOLN: INTRAVENOUS | Status: DC

## 2024-03-04 MED ORDER — FENTANYL CITRATE PF 50 MCG/ML IJ SOSY
PREFILLED_SYRINGE | INTRAMUSCULAR | Status: AC
Start: 2024-03-04 — End: 2024-03-04
  Filled 2024-03-04: qty 1

## 2024-03-04 MED ORDER — SODIUM CHLORIDE 0.9% FLUSH: 3.0000 mL | Freq: Two times a day (BID) | INTRAVENOUS | Status: DC

## 2024-03-04 MED ORDER — MORPHINE SULFATE (PF) 2 MG/ML IV SOLN: 2.0000 mg | INTRAVENOUS | Status: DC | PRN

## 2024-03-04 MED ORDER — DIPHENHYDRAMINE HCL 50 MG/ML IJ SOLN: 50.0000 mg | Freq: Once | INTRAMUSCULAR | Status: DC | PRN

## 2024-03-04 MED ORDER — HEPARIN SOD (PORK) LOCK FLUSH 100 UNIT/ML IV SOLN
INTRAVENOUS | Status: AC
  Filled 2024-03-04: qty 5

## 2024-03-04 MED ORDER — HEPARIN SODIUM (PORCINE) 1000 UNIT/ML IJ SOLN
INTRAMUSCULAR | Status: DC | PRN
  Administered 2024-03-04: 500 [IU]
  Administered 2024-03-04: 6000 [IU] via INTRAVENOUS

## 2024-03-04 MED ORDER — MIDAZOLAM HCL 2 MG/ML PO SYRP: 8.0000 mg | ORAL_SOLUTION | Freq: Once | ORAL | Status: DC | PRN

## 2024-03-04 MED ORDER — HYDRALAZINE HCL 20 MG/ML IJ SOLN: 5.0000 mg | INTRAMUSCULAR | Status: DC | PRN

## 2024-03-04 MED ORDER — IODIXANOL 320 MG/ML IV SOLN
INTRAVENOUS | Status: DC | PRN
  Administered 2024-03-04: 50 mL

## 2024-03-04 MED ORDER — CEFAZOLIN SODIUM-DEXTROSE 2-4 GM/100ML-% IV SOLN
2.0000 g | INTRAVENOUS | Status: AC
  Administered 2024-03-04: 2 g via INTRAVENOUS

## 2024-03-04 MED ORDER — SODIUM CHLORIDE 0.9 % IV SOLN
INTRAVENOUS | Status: DC
Start: 2024-03-04 — End: 2024-03-04

## 2024-03-04 MED ORDER — SODIUM CHLORIDE 0.9% FLUSH
3.0000 mL | INTRAVENOUS | Status: DC | PRN
Start: 2024-03-04 — End: 2024-03-04

## 2024-03-04 MED ORDER — LIDOCAINE HCL (PF) 1 % IJ SOLN
INTRAMUSCULAR | Status: DC | PRN
Start: 2024-03-04 — End: 2024-03-04
  Administered 2024-03-04: 10 mL

## 2024-03-04 MED ORDER — MIDAZOLAM HCL 2 MG/2ML IJ SOLN
INTRAMUSCULAR | Status: DC | PRN
  Administered 2024-03-04: 2 mg via INTRAVENOUS
  Administered 2024-03-04 (×2): 1 mg via INTRAVENOUS

## 2024-03-04 MED ORDER — METHYLPREDNISOLONE SODIUM SUCC 125 MG IJ SOLR: 125.0000 mg | Freq: Once | INTRAMUSCULAR | Status: DC | PRN

## 2024-03-04 MED ORDER — FENTANYL CITRATE PF 50 MCG/ML IJ SOSY
PREFILLED_SYRINGE | INTRAMUSCULAR | Status: AC
  Filled 2024-03-04: qty 1

## 2024-03-04 MED ORDER — ACETAMINOPHEN 325 MG PO TABS
650.0000 mg | ORAL_TABLET | ORAL | Status: DC | PRN
Start: 2024-03-04 — End: 2024-03-04

## 2024-03-04 MED ORDER — LABETALOL HCL 5 MG/ML IV SOLN: 10.0000 mg | INTRAVENOUS | Status: DC | PRN

## 2024-03-04 SURGICAL SUPPLY — 16 items
CATH ANGIO 5F PIGTAIL 65CM (CATHETERS) IMPLANT
CATH SEEKER .035X90 (CATHETERS) IMPLANT
COVER PROBE ULTRASOUND 5X96 (MISCELLANEOUS) IMPLANT
DEVICE STARCLOSE SE CLOSURE (Vascular Products) IMPLANT
GLIDEWIRE ADV .035X260CM (WIRE) IMPLANT
GLIDEWIRE STIFF .35X180X3 HYDR (WIRE) IMPLANT
GOWN STRL REUS W/ TWL LRG LVL3 (GOWN DISPOSABLE) ×1 IMPLANT
NDL ENTRY 21GA 7CM ECHOTIP (NEEDLE) IMPLANT
NEEDLE ENTRY 21GA 7CM ECHOTIP (NEEDLE) ×1 IMPLANT
PACK ANGIOGRAPHY (CUSTOM PROCEDURE TRAY) ×1 IMPLANT
SET INTRO CAPELLA COAXIAL (SET/KITS/TRAYS/PACK) IMPLANT
SHEATH ANL2 6FRX45 HC (SHEATH) IMPLANT
SHEATH BRITE TIP 6FRX11 (SHEATH) IMPLANT
SYR MEDRAD MARK 7 150ML (SYRINGE) IMPLANT
TUBING CONTRAST HIGH PRESS 72 (TUBING) IMPLANT
WIRE J 3MM .035X145CM (WIRE) IMPLANT

## 2024-03-04 NOTE — Interval H&P Note (Signed)
 History and Physical Interval Note:  03/04/2024 2:56 PM  Joshua [name]  has presented today for surgery, with the diagnosis of RLE Angio   ASO w ulceration.  The various methods of treatment have been discussed with the patient and family. After consideration of risks, benefits and other options for treatment, the patient has consented to  Procedure(s): LOWER EXTREMITY INTERVENTION (Right) Lower Extremity Angiography (Right) as a surgical intervention.  The patient's history has been reviewed, patient examined, no change in status, stable for surgery.  I have reviewed the patient's chart and labs.  Questions were answered to the patient's satisfaction.     [name]

## 2024-03-04 NOTE — Op Note (Signed)
 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  Abdominal aortogram  2.  Selective injection of the right lower extremity third order catheter placement  3.  Ultrasound-guided access to the left common femoral artery  4.  StarClose left femoral artery    Anesthesia: Conscious sedation was administered by the interventional radiology RN under my direct supervision. IV Versed  plus fentanyl  were utilized. Continuous ECG, pulse oximetry and blood pressure was monitored throughout the entire procedure.  Conscious sedation was administered for a total of 48 minutes.  Sheath: 6 [place] Ansell sheath retrograde left common femoral  Contrast: 50 cc   Fluoroscopy Time: 10.1 minutes  Indications:  The patient presents to [facility] with atherosclerotic occlusive disease bilateral lower extremities with multiple ulcers of the right foot and ankle.  Pedal pulses are nonpalpable bilaterally suggesting hemodynamically significant atherosclerotic occlusive disease.  The risks and benefits as well as alternative therapies for lower extremity revascularization are reviewed with the patient all questions are answered the patient agrees to proceed.  The patient is therefore undergoing angiography with the hope for intervention for limb salvage.   Procedure:  Joshua Peddie Blackwellis a 68 y.o. male who was identified and appropriate procedural time out was performed.  The patient was then placed supine on the table and prepped and draped in the usual sterile fashion.  Ultrasound was used to evaluate the left common femoral artery.  It was echolucent and pulsatile indicating it is patent .  An  ultrasound image was acquired for the permanent record.  A micropuncture needle was used to access the left common femoral artery under direct ultrasound guidance.  The microwire was then advanced under fluoroscopic guidance without difficulty followed by the micro-sheath.  A 0.035 J wire was advanced without resistance and a 6 Fr sheath was placed.    Pigtail catheter was then advanced to the level of T12 and AP projection of the aorta was obtained. Pigtail catheter was then repositioned to above the bifurcation and LAO view of the pelvis was obtained. Stiff angled Glidewire and pigtail catheter was then used across the bifurcation and the catheter was positioned in the distal external iliac artery.  RAO of the right groin was then obtained. Wire was reintroduced and the wire was negotiated into the mid profunda femoris and the catheter was advanced into the profunda femoris. Distal runoff was then performed.  This represents third order catheter placement  The advantage wire was reintroduced and the 6 French sheath exchanged for a 6 [place] Ansell sheath which was positioned with its tip in the mid common femoral.  Kumpe catheter was then advanced down and multiple attempts at crossing the SFA as well as the bypass graft were made but I could not advance the wire through the fibrous proximal.  I elected to terminate the case.  After review of the images the catheter was removed over wire and an LAO view of the groin was obtained.  Celt device would not advance through the sheath the wire was reintroduced and the 6 French sheath was exchanged for a StarClose sheath.  StarClose device was deployed without difficulty.   Findings:   Aortogram: The abdominal aorta is opacified with a bolus injection contrast.  Single renal arteries are  noted bilaterally with normal nephrograms.  No evidence of hemodynamically significant renal artery stenosis.  There are no hemodynamically significant stenoses identified  within the aorta.  The aortic bifurcation is mildly diseased but widely patent.  Bilateral common internal and external iliac arteries are free of hemodynamically significant lesions.  Right lower Extremity: The right common femoral and profunda femoris arteries are widely patent there are no hemodynamically significant stenoses and minimal atherosclerotic changes.  The native superficial femoral and previous Pham distal bypass graft both demonstrate extensive previously placed stents.  Both are occluded throughout their entirety.  The popliteal artery and the trifurcation is occluded in its entirety.  The anterior tibial is nonvisualized throughout its entire course.  There is reconstitution of the peroneal and its midportion.  There is reconstitution of the posterior tibial in its midportion it is a 3 mm artery that courses down to the foot and fills the plantar arteries.  SUMMARY: Based on these images no intervention is performed at this time.  Given the reconstitution of the posterior tibial 2 options are available.  He is certainly a candidate for a redo femoral to posterior tibial bypass this would require using CryoVein.  In light of his a Avastin  therapy this does represent significant wound healing problems.  He is also a candidate for a pedal approach with attempts at crossing the occlusions retrograde.  I have discussed both options with the patient and he wishes to move forward with whichever I deemed to be the better choice.  As noted above given his a Avastin  therapy I feel the pedal approach and potential stenting out ways the long length and redo nature of the stents and we will arrange for angiography with the hope for intervention next week.    Disposition: Patient was taken to the recovery room in stable condition having tolerated the procedure well.  Joshua [name] 03/04/2024,4:58 PM

## 2024-03-04 NOTE — OR Nursing (Addendum)
 Dr Jama discussed treatment plan with patient and family members. Ok to discharge one hr post procedure. Pt site unremarkable and assistance provided to dress and discharge. Unable to ambulate due to pre existing weakness and sores on feet but pt stood with a assistance to pivot to wheelchair and car.  Discharge instruction given to patient and family. No follow up appointment made at this time. [facility] to call next week with next appointment.

## 2024-03-07 ENCOUNTER — Encounter: Payer: Self-pay | Admitting: Vascular Surgery

## 2024-03-11 ENCOUNTER — Telehealth (INDEPENDENT_AMBULATORY_CARE_PROVIDER_SITE_OTHER): Payer: Self-pay

## 2024-03-11 NOTE — Telephone Encounter (Signed)
 Patient is calling asking about being scheduled for a surgery. I advised that because Dr. Jama has been in surgery everyday since Tuesday, I have not spoken with him regarding surgery for the patient. I advised that on Monday I will speak with Dr. Jama to find out what surgery the patient needs to have scheduled and call him back.

## 2024-03-16 ENCOUNTER — Inpatient Hospital Stay

## 2024-03-16 ENCOUNTER — Inpatient Hospital Stay (HOSPITAL_BASED_OUTPATIENT_CLINIC_OR_DEPARTMENT_OTHER): Admitting: Oncology

## 2024-03-16 ENCOUNTER — Encounter: Payer: Self-pay | Admitting: Oncology

## 2024-03-16 VITALS — BP 97/83 | HR 108 | Temp 97.9°F | Resp 18 | Wt 189.0 lb

## 2024-03-16 VITALS — BP 120/68 | HR 83 | Temp 97.0°F | Resp 19

## 2024-03-16 DIAGNOSIS — E876 Hypokalemia: Secondary | ICD-10-CM

## 2024-03-16 DIAGNOSIS — C3491 Malignant neoplasm of unspecified part of right bronchus or lung: Secondary | ICD-10-CM

## 2024-03-16 DIAGNOSIS — E032 Hypothyroidism due to medicaments and other exogenous substances: Secondary | ICD-10-CM | POA: Diagnosis not present

## 2024-03-16 DIAGNOSIS — C7931 Secondary malignant neoplasm of brain: Secondary | ICD-10-CM

## 2024-03-16 DIAGNOSIS — R569 Unspecified convulsions: Secondary | ICD-10-CM

## 2024-03-16 DIAGNOSIS — Z5112 Encounter for antineoplastic immunotherapy: Secondary | ICD-10-CM | POA: Diagnosis not present

## 2024-03-16 LAB — COMPREHENSIVE METABOLIC PANEL WITH GFR
ALT: 9 U/L (ref 0–44)
AST: 29 U/L (ref 15–41)
Albumin: 3.2 g/dL — ABNORMAL LOW (ref 3.5–5.0)
Alkaline Phosphatase: 82 U/L (ref 38–126)
Anion gap: 11 (ref 5–15)
BUN: <5 mg/dL — ABNORMAL LOW (ref 8–23)
CO2: 23 mmol/L (ref 22–32)
Calcium: 9 mg/dL (ref 8.9–10.3)
Chloride: 104 mmol/L (ref 98–111)
Creatinine, Ser: 0.87 mg/dL (ref 0.61–1.24)
GFR, Estimated: >60 mL/min (ref >60)
Glucose, Bld: 127 mg/dL — ABNORMAL HIGH (ref 70–99)
Potassium: 3.6 mmol/L (ref 3.5–5.1)
Sodium: 138 mmol/L (ref 135–145)
Total Bilirubin: 0.6 mg/dL (ref 0.0–1.2)
Total Protein: 6.5 g/dL (ref 6.5–8.1)

## 2024-03-16 LAB — CBC WITH DIFFERENTIAL/PLATELET
Abs Immature Granulocytes: 0.01 K/uL (ref 0.00–0.07)
Basophils Absolute: 0.1 K/uL (ref 0.0–0.1)
Basophils Relative: 2 %
Eosinophils Absolute: 0.3 K/uL (ref 0.0–0.5)
Eosinophils Relative: 6 %
HCT: 41.6 % (ref 39.0–52.0)
Hemoglobin: 13.2 g/dL (ref 13.0–17.0)
Immature Granulocytes: 0 %
Lymphocytes Relative: 26 %
Lymphs Abs: 1.3 K/uL (ref 0.7–4.0)
MCH: 26.4 pg (ref 26.0–34.0)
MCHC: 31.7 g/dL (ref 30.0–36.0)
MCV: 83.2 fL (ref 80.0–100.0)
Monocytes Absolute: 0.6 K/uL (ref 0.1–1.0)
Monocytes Relative: 13 %
Neutro Abs: 2.7 K/uL (ref 1.7–7.7)
Neutrophils Relative %: 53 %
Platelets: 301 K/uL (ref 150–400)
RBC: 5 MIL/uL (ref 4.22–5.81)
RDW: 14.8 % (ref 11.5–15.5)
WBC: 5 K/uL (ref 4.0–10.5)
nRBC: 0 % (ref 0.0–0.2)

## 2024-03-16 MED ORDER — SODIUM CHLORIDE 0.9 % IV SOLN
10.0000 mg/kg | Freq: Once | INTRAVENOUS | Status: AC
  Administered 2024-03-16: 860 mg via INTRAVENOUS
  Filled 2024-03-16: qty 7.2

## 2024-03-16 MED ORDER — SODIUM CHLORIDE 0.9 % IV SOLN
Freq: Once | INTRAVENOUS | Status: AC
  Filled 2024-03-16: qty 250

## 2024-03-16 NOTE — Progress Notes (Signed)
 Patient here for follow up. Reports that he has sinus drainage.

## 2024-03-16 NOTE — Assessment & Plan Note (Addendum)
 TSH, T4 are pending continue levothyroxine  125 mcg daily

## 2024-03-16 NOTE — Assessment & Plan Note (Signed)
 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  Labs are reviewed and discussed with patient. Proceed with Durvalumab 

## 2024-03-16 NOTE — Assessment & Plan Note (Signed)
 IV Kcl 20meq today.  Recommend patient to start oral potassium 20meq daily

## 2024-03-16 NOTE — Progress Notes (Signed)
 Hematology/Oncology Progress note [phone] [phone]    CHIEF COMPLAINTS/REASON FOR VISIT:  Follow up for lung cancer  ASSESSMENT & PLAN:   Cancer Staging  Squamous cell carcinoma of right lung (HCC) Staging form: Lung, AJCC 8th Edition - Clinical stage from 09/20/2020: Stage IV (cT1b, cN2, cM1) - Signed by [name], MD on 12/13/2021   Squamous cell carcinoma of right lung (HCC) Durvalumab  was previously held due to pneumonitis.  Labs are reviewed and discussed with patient. Proceed with Durvalumab     Encounter for antineoplastic immunotherapy Durvalumab  treatment as planned.   Hypokalemia IV Kcl 20meq today.  Recommend patient to start oral potassium 20meq daily  Hypothyroidism due to medication TSH, T4 are pending continue levothyroxine  125 mcg daily    Malignant neoplasm metastatic to brain [facility]) S/p SRS by Radonc Dr. Dewey.  PET brain finding suggests recurrence -NeuroOnc recommend Radiation and Bevacizumab   Discussed with [name].   MRI brain showed good response.  On Bevacizumab  monthly, last dose 9/8   Seizure (HCC) Follow up with [name].  Continue keppra    Tachycardia, BP is borderline. Patient is asymptomatic.  Encourage oral hydration.    Follow up  2 weeks lab MD Durvalumab  Bevacizumab  All questions were answered. The patient knows to call the clinic with any problems, questions or concerns.  Call Babara, MD, PhD [facility] Hematology Oncology 03/16/2024       HISTORY OF PRESENTING ILLNESS:  Joshua [name] is a 68 y.o. male who has above history reviewed by me today presents for follow up visit for Stage IV lung squamous cell carcinoma. Oncology History  Squamous cell carcinoma of right lung (HCC)  09/18/2020 Imaging   PET scan showed right upper lobe nodule maximal SUV 11.5.  Hypermetabolic cluster right paratracheal adenopathy with maximum SUV up to 12.  No findings of metastatic disease to the  neck/abdomen/pelvis or skeleton.   09/20/2020 Initial Diagnosis   Squamous cell carcinoma of right lung (HCC) NGS showed PD-L1 TPS 95%, LRP1B S997fs, PIK3CA E545K, RB1 c1422-2A>T, RIT1 T83_ A84del, STK11 P281fs, TPS53 R248W, TMB 19.48mut/mb, MS stable.    09/20/2020 Cancer Staging   Staging form: Lung, AJCC 8th Edition - Clinical stage from 09/20/2020: Stage IV (cT1b, cN2, cM1) - Signed by [name], MD on 12/13/2021   10/03/2020 Imaging   MRI brain with and without contrast showed no evidence of intracranial metastatic disease. Well-defined round lesion within the left parotid tail measuring approximately 2.3 x 1.8 x 1.8 cm   10/09/2020 - 11/27/2020 Radiation Therapy   concurrent chemoradiation   10/10/2020 - 10/31/2020 Chemotherapy   10/10/2020 concurrent chemoradiation.  carboplatin  AUC of 2 and Taxol  45 mg/m2 x 4.  Additional chemotherapy was held due to thrombocytopenia, neutropenia       12/31/2020 - 03/21/2022 Chemotherapy   Maintenance  Durvalumab  q14d      12/31/2020 -  Chemotherapy   Patient is on Treatment Plan : LUNG Durvalumab  (10) q14d     03/28/2021 Imaging   CT without contrast showed new linear right perihilar consolidation with associated traction bronchiectasis.  Likely postradiation changes.  Additional new peribronchial vascular/nodular groundglass opacities with areas of consolidation or seen primarily in the right upper and lower lobes, more distant from expected radiation fields. 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  Per my discussion with pulmonology Dr. 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  05/09/2021 Imaging   Repeat CT chest without contrast showed evolving radiation changes in the right perihilar region likely accounting for interval increase to thickening of the posterior wall of the right upper lobe bronchus.  The more  peripheral right lung groundglass opacities seen on most recent study have generally improved with exception of one peripherally right upper lobe which appears new.  These are likely inflammatory.  No findings highly suspicious for local recurrence.   05/30/2021 - 01/10/2022 Chemotherapy   Durvalumab  q14d     10/07/2021 Imaging   MRI brain with and without contrast showed 2 contrast-enhancing lesions, most consistent with metastatic disease.  Largest lesion is in the left frontal lobe and measures up to 1.3 cm with a large amount of surrounding edema and 9 mm rightward midline shift.  The other enhancing lesion was 4 mm in the right occipital lobe with a small amount of surrounding edema.  No acute or chronic hemorrhage.   10/07/2021 Progression   Headache after colonoscopy, progressive difficulty using her right arm.  Progressively worsening of speech. Stage IV lung cancer with brain metastasis.  started on Dexamethasone .  He was seen by neurosurgery and radiation oncology.  Recommendation is to proceed with SRS.  Patient was discharged with dexamethasone    10/16/2021 Imaging   PET scan showed postradiation changes in the right supra hilar lung without evidence of local recurrence.  No evidence of lung cancer recurrence or metastatic disease.  Single focus of intense metabolic activity associated with posterior left 11th rib likely secondary to recent fall/posttraumatic metabolic changes.  Frontal  uptake in the left frontal lobe corresponding to the known metastatic lesion in the brain   10/29/2021 -  Radiation Therapy   Brain Radiation [facility]   12/10/2021 Imaging   Brain MRI: Continued interval decrease in size of a left frontal lobe metastasis, now measuring 10 mm. Moderate surrounding edema has also decreased.  3-4 mm metastasis within the right occipital lobe, not significantly changed in size. There is now a small focus of central non enhancement within this lesion. Increased edema. 3 mm enhancing  metastasis within the high left parietal lobe. In retrospect, this was likely present as a subtle punctate focus of enhancement on the prior brain MRI of 10/22/2021.   01/18/2022 Imaging   CT chest abdomen pelvis w contrast 1. Stable post treatment changes in the right upper lobe medially with dense radiation fibrosis but no findings to suggest local recurrent tumor. 2. Significant progression of ill-defined ground-glass opacity, interstitial thickening and airspace nodularity in the right upper lobe and upper aspect of the right lower lobe. Findings could reflect an inflammatory or atypical infectious process, drug induced pneumonitis or interstitial spread of tumor.3. No mediastinal or hilar mass or adenopathy. 4. No findings for abdominal/pelvic metastatic disease or osseous metastatic disease.   02/19/2022 Imaging   MRI brain w wo contrast showed Three metastatic deposits in the brain appear smaller. Decreased edema in the left parietal lobe. No new lesions    04/04/2022 Imaging   CT chest w contrast 1. Stable radiation changes in the right perihilar region without evidence of local recurrence. 2. Interval improvement in the patchy ground-glass opacities previously demonstrated in both lungs, most consistent with a resolving infectious or inflammatory process (including immunotherapy-related pneumonitis). 3. No evidence of metastatic disease.   05/14/2022 Miscellaneous   He had a mechanical fall and fractured his left elbow. xrays showed a left elbow fracture  05/26/22 seen by orthopedic surgeon, cast applied.  05/27/2022 Imaging   MRI brain w wo contrast 1. All three known brain metastases have enlarged since August, with increasing regional edema. And there is a small new left anterior cerebellar 4-5 mm metastasis (was questionably punctate on the May MRI). This constellation could reflect a combination of True Progression (Left Cerebellar lesion) and pseudoprogression/radionecrosis (3  existing mets).    2. No significant intracranial mass effect. 3. Underlying moderate cerebral white matter signal changes.   05/30/2022 Imaging   CT chest abdomen pelvis w contrast  1. 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 There is a stable small right pleural effusion, but no definitive findings to suggest residual or recurrent disease on today's examination. 2. No signs of metastatic disease in the abdomen or pelvis.  3. Aortic atherosclerosis, in addition to two-vessel coronary artery disease. Please note that although the presence of coronary artery calcium  documents the presence of coronary artery disease, the severity of this disease and any potential stenosis cannot be assessed on this non-gated CT examination. Assessment for potential risk factor modification, dietary therapy or pharmacologic therapy may be warranted, if clinically indicated. 4. Hepatic steatosis. 5. Colonic diverticulosis without evidence of acute diverticulitis at this time. 6. Incompletely imaged but apparently occluded vascular graft in the superior aspect of the right anterior thigh. Referral to vascular specialist for further clinical evaluation should be considered if clinically appropriate. 7. Additional incidental findings, as above.   07/22/2022 Imaging   MRI brain  1. No new or enlarging lesions. No intracranial disease progression. 2. Decreased size of treated lesions of the left frontoparietal junction and left postcentral gyrus. 3. Unchanged lesions in the left cerebellum and right occipital lobe.   10/15/2022 Imaging   MRI brain w wo contrast showed Interval increase in size of multiple intraparenchymal lesions with worsened surrounding vasogenic edema. 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 No new  lesions   11/05/2022 Imaging   MRI brain w wo contrast  1. Likely slight increase and right occipital and left cerebellar lesions. Otherwise, unchanged metastases, as above. No new lesions identified. 2. Partially imaged large probable lymph node in the left upper neck, apparent on prior PET CT.   11/13/2022 -  Radiation Therapy   Each of the sites were treated with SRS/SBRT/SRT-IMRT technique to 20 Gy in 1 fraction to each of the following sites: PTV_2_LCerebel_27mm PTV_3_RCerebel_63mm PTV_4_ROccip_52mm PTV_5_LParietal_34mm   11/27/2022 Imaging   CT chest abdomen pelvis w contrast showed  Improving bilateral scattered interstitial and ground-glass opacities. Small amount of residual areas in the extreme left lower lobe inferiorly. This specific area is slightly increased from previous but overall the amount of opacities is significantly improved. Recommend continued follow-up. No developing new mass lesion, fluid collection or lymph node enlargement. Stable posttreatment changes right perihilar in the lung. Fatty liver infiltration. Colonic diverticulosis. Improved tiny right pleural effusion   02/05/2023 Imaging   MRI brain with and without contrast Interval decrease in all of the metastatic lesions, detailed above.No new lesions identified.    Imaging   CT chest abdomen pelvis w contrast showed  1. 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 2. Trace right pleural fluid or thickening, new or minimally increased. 3. Incidental findings, including: Aortic atherosclerosis (ICD10-I70.0), coronary artery atherosclerosis and emphysema (ICD10-J43.9). Hepatic steatosis.   06/25/2023 Imaging   CT chest abdomen pelvis  w contrast showed 1. Unchanged post treatment/post radiation appearance of the right lung, with perihilar and suprahilar paramedian fibrosis and consolidation. 2. No evidence of lymphadenopathy or metastatic disease  in the chest, abdomen, or pelvis. 3. Hepatic steatosis. 4. Coronary artery disease.   Aortic Atherosclerosis (ICD10-I70.0) and Emphysema (ICD10-J43.9).   08/26/2023 [facility] Admission   hospitalization due to was observed speech and jerking movements on right side despite being on dexamethasone . Continuous EEG suggest cortical dysfunction arising from left temporal region.  No seizure or elective form discharges were seen. Patient was discharged on dexamethasone  4 mg, Keppra , lamotrigine .    11/04/2023 -  Chemotherapy   Add Bevacizumab  5mg /kg to Durvalumab .     11/17/2023 - 11/27/2023 Radiation Therapy   Patient received radiation to brain for treatment of recurrence.   02/23/2024 Imaging   CT chest abdomen pelvis w contrast showed  CHEST:   1. Post RIGHT upper lobe resection. 2. No evidence of lung cancer recurrence. 3. No mediastinal adenopathy.   PELVIS:   1. No evidence of metastatic disease in the abdomen pelvis. 2. No skeletal metastasis.     INTERVAL HISTORY HARLYN ITALIANO is a 68 y.o. male who has above history reviewed by me today presents for follow up visit for right lung RCC.  chronic SOB,not worse.  Right lower extremity wound- he follows up with wound care Nausea has improved.    Review of Systems  Constitutional:  Positive for fatigue. Negative for appetite change, chills, fever and unexpected weight change.  HENT:   Negative for hearing loss and voice change.   Eyes:  Negative for eye problems and icterus.  Respiratory:  Negative for chest tightness, cough and shortness of breath.   Cardiovascular:  Positive for leg swelling. Negative for chest pain.  Gastrointestinal:  Negative for abdominal distention, abdominal pain, nausea and vomiting.  Endocrine: Negative for hot flashes.  Genitourinary:  Negative for difficulty urinating, dysuria and frequency.   Musculoskeletal:  Positive for gait problem. Negative for arthralgias.       Right big toe wound   Skin:  Negative for itching and rash.  Neurological:  Positive for gait problem. Negative for extremity weakness, headaches, numbness and speech difficulty.  Hematological:  Negative for adenopathy. Bruises/bleeds easily.  Psychiatric/Behavioral:  Negative for confusion.       MEDICAL HISTORY:  Past Medical History:  Diagnosis Date   Allergy    Cancer (HCC)    lung   COPD (chronic obstructive pulmonary disease) (HCC)    Dyspnea    former smoker. 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   Malignant neoplasm metastatic to brain [facility])    Nausea with vomiting 02/01/2024   Peripheral vascular disease (HCC)    Pre-diabetes    Sleep apnea     SURGICAL HISTORY: Past Surgical History:  Procedure Laterality Date   ANTERIOR CERVICAL DECOMP/DISCECTOMY FUSION N/A 03/14/2020   Procedure: ANTERIOR CERVICAL DECOMPRESSION/DISCECTOMY FUSION 3 LEVELS C4-7;  Surgeon: Clois Fret, MD;  Location: [facility] ORS;  Service: Neurosurgery;  Laterality: N/A;   BACK SURGERY  2011   CENTRAL LINE INSERTION Right 09/10/2016   Procedure: CENTRAL LINE INSERTION;  Surgeon: Jama Cordella MATSU, MD;  Location: [facility] ORS;  Service: Vascular;  Laterality: Right;   CERVICAL FUSION     C4-c7 on Mar 14, 2020   COLONOSCOPY  2013 ?   COLONOSCOPY N/A 09/09/2021   Procedure: COLONOSCOPY;  Surgeon:  Onita Elspeth Sharper, DO;  Location: [facility] ENDOSCOPY;  Service: Gastroenterology;  Laterality: N/A;   CORONARY ANGIOPLASTY     ESOPHAGOGASTRODUODENOSCOPY N/A 09/09/2021   Procedure: ESOPHAGOGASTRODUODENOSCOPY (EGD);  Surgeon: Onita Elspeth Sharper, DO;  Location: [facility] ENDOSCOPY;  Service: Gastroenterology;  Laterality: N/A;   FEMORAL-POPLITEAL BYPASS GRAFT Right 09/10/2016   Procedure: BYPASS GRAFT FEMORAL-POPLITEAL ARTERY ( BELOW KNEE );  Surgeon: Jama Cordella MATSU, MD;  Location: [facility] ORS;  Service: Vascular;  Laterality: Right;   FEMORAL-TIBIAL BYPASS GRAFT Right  03/23/2020   Procedure: BYPASS GRAFT RIGHT FEMORAL- DISTAL TIBIAL ARTERY WITH DISTAL FLOW GRAFT;  Surgeon: Jama Cordella MATSU, MD;  Location: [facility] ORS;  Service: Vascular;  Laterality: Right;   LEFT HEART CATH AND CORONARY ANGIOGRAPHY Left 05/16/2021   Procedure: LEFT HEART CATH AND CORONARY ANGIOGRAPHY;  Surgeon: Lawyer Bernardino Cough, MD;  Location: [facility] INVASIVE CV LAB;  Service: Cardiovascular;  Laterality: Left;   LOWER EXTREMITY ANGIOGRAPHY Right 11/18/2016   Procedure: Lower Extremity Angiography;  Surgeon: Cordella MATSU Jama, MD;  Location: [facility] INVASIVE CV LAB;  Service: Cardiovascular;  Laterality: Right;   LOWER EXTREMITY ANGIOGRAPHY Right 12/09/2016   Procedure: Lower Extremity Angiography;  Surgeon: Jama Cordella MATSU, MD;  Location: [facility] INVASIVE CV LAB;  Service: Cardiovascular;  Laterality: Right;   LOWER EXTREMITY ANGIOGRAPHY Right 11/15/2019   Procedure: LOWER EXTREMITY ANGIOGRAPHY;  Surgeon: Jama Cordella MATSU, MD;  Location: [facility] INVASIVE CV LAB;  Service: Cardiovascular;  Laterality: Right;   LOWER EXTREMITY ANGIOGRAPHY Right 12/01/2019   Procedure: Lower Extremity Angiography;  Surgeon: Marea Selinda RAMAN, MD;  Location: [facility] INVASIVE CV LAB;  Service: Cardiovascular;  Laterality: Right;   LOWER EXTREMITY ANGIOGRAPHY Right 12/02/2019   Procedure: LOWER EXTREMITY ANGIOGRAPHY;  Surgeon: Jama Cordella MATSU, MD;  Location: [facility] INVASIVE CV LAB;  Service: Cardiovascular;  Laterality: Right;   LOWER EXTREMITY ANGIOGRAPHY Right 03/23/2020   Procedure: Lower Extremity Angiography;  Surgeon: Jama Cordella MATSU, MD;  Location: [facility] INVASIVE CV LAB;  Service: Cardiovascular;  Laterality: Right;   LOWER EXTREMITY ANGIOGRAPHY Right 09/25/2021   Procedure: Lower Extremity Angiography;  Surgeon: Jama Cordella MATSU, MD;  Location: [facility] INVASIVE CV LAB;  Service: Cardiovascular;  Laterality: Right;   LOWER EXTREMITY ANGIOGRAPHY Right 09/26/2021   Procedure: Lower Extremity Angiography;  Surgeon: Marea Selinda RAMAN, MD;   Location: [facility] INVASIVE CV LAB;  Service: Cardiovascular;  Laterality: Right;   LOWER EXTREMITY ANGIOGRAPHY Right 04/08/2022   Procedure: Lower Extremity Angiography;  Surgeon: Jama Cordella MATSU, MD;  Location: [facility] INVASIVE CV LAB;  Service: Cardiovascular;  Laterality: Right;   LOWER EXTREMITY ANGIOGRAPHY Right 03/04/2024   Procedure: Lower Extremity Angiography;  Surgeon: Jama Cordella MATSU, MD;  Location: [facility] INVASIVE CV LAB;  Service: Cardiovascular;  Laterality: Right;   LOWER EXTREMITY INTERVENTION  11/18/2016   Procedure: Lower Extremity Intervention;  Surgeon: Cordella MATSU Jama, MD;  Location: [facility] INVASIVE CV LAB;  Service: Cardiovascular;;   LOWER EXTREMITY INTERVENTION Right 03/04/2024   Procedure: LOWER EXTREMITY INTERVENTION;  Surgeon: Jama Cordella MATSU, MD;  Location: [facility] INVASIVE CV LAB;  Service: Cardiovascular;  Laterality: Right;   PERIPHERAL VASCULAR CATHETERIZATION Right 01/02/2015   Procedure: Lower Extremity Angiography;  Surgeon: Cordella MATSU Jama, MD;  Location: [facility] INVASIVE CV LAB;  Service: Cardiovascular;  Laterality: Right;   PERIPHERAL VASCULAR CATHETERIZATION Right 06/18/2015   Procedure: Lower Extremity Angiography;  Surgeon: Selinda RAMAN Marea, MD;  Location: [facility] INVASIVE CV LAB;  Service: Cardiovascular;  Laterality: Right;   PERIPHERAL VASCULAR CATHETERIZATION  06/18/2015   Procedure: Lower Extremity Intervention;  Surgeon: Selinda GORMAN Gu, MD;  Location: [facility] INVASIVE CV LAB;  Service: Cardiovascular;;   PERIPHERAL VASCULAR CATHETERIZATION N/A 10/09/2015   Procedure: Abdominal Aortogram w/Lower Extremity;  Surgeon: Cordella KANDICE Shawl, MD;  Location: [facility] INVASIVE CV LAB;  Service: Cardiovascular;  Laterality: N/A;   PERIPHERAL VASCULAR CATHETERIZATION  10/09/2015   Procedure: Lower Extremity Intervention;  Surgeon: Cordella KANDICE Shawl, MD;  Location: [facility] INVASIVE CV LAB;  Service: Cardiovascular;;   PERIPHERAL VASCULAR CATHETERIZATION Right 10/10/2015   Procedure: Lower  Extremity Angiography;  Surgeon: Selinda GORMAN Gu, MD;  Location: [facility] INVASIVE CV LAB;  Service: Cardiovascular;  Laterality: Right;   PERIPHERAL VASCULAR CATHETERIZATION Right 10/10/2015   Procedure: Lower Extremity Intervention;  Surgeon: Selinda GORMAN Gu, MD;  Location: [facility] INVASIVE CV LAB;  Service: Cardiovascular;  Laterality: Right;   PERIPHERAL VASCULAR CATHETERIZATION Right 12/03/2015   Procedure: Lower Extremity Angiography;  Surgeon: Selinda GORMAN Gu, MD;  Location: [facility] INVASIVE CV LAB;  Service: Cardiovascular;  Laterality: Right;   PERIPHERAL VASCULAR CATHETERIZATION Right 12/04/2015   Procedure: Lower Extremity Angiography;  Surgeon: Selinda GORMAN Gu, MD;  Location: [facility] INVASIVE CV LAB;  Service: Cardiovascular;  Laterality: Right;   PERIPHERAL VASCULAR CATHETERIZATION  12/04/2015   Procedure: Lower Extremity Intervention;  Surgeon: Selinda GORMAN Gu, MD;  Location: [facility] INVASIVE CV LAB;  Service: Cardiovascular;;   PERIPHERAL VASCULAR CATHETERIZATION Right 06/30/2016   Procedure: Lower Extremity Angiography;  Surgeon: Selinda GORMAN Gu, MD;  Location: [facility] INVASIVE CV LAB;  Service: Cardiovascular;  Laterality: Right;   PERIPHERAL VASCULAR CATHETERIZATION Right 06/25/2016   Procedure: Lower Extremity Angiography;  Surgeon: Cordella KANDICE Shawl, MD;  Location: [facility] INVASIVE CV LAB;  Service: Cardiovascular;  Laterality: Right;   PERIPHERAL VASCULAR CATHETERIZATION Right 07/01/2016   Procedure: Lower Extremity Angiography;  Surgeon: Cordella KANDICE Shawl, MD;  Location: [facility] INVASIVE CV LAB;  Service: Cardiovascular;  Laterality: Right;   PERIPHERAL VASCULAR CATHETERIZATION  07/01/2016   Procedure: Lower Extremity Intervention;  Surgeon: Cordella KANDICE Shawl, MD;  Location: [facility] INVASIVE CV LAB;  Service: Cardiovascular;;   PERIPHERAL VASCULAR CATHETERIZATION Right 08/01/2016   Procedure: Lower Extremity Angiography;  Surgeon: Cordella KANDICE Shawl, MD;  Location: [facility] INVASIVE CV LAB;  Service: Cardiovascular;  Laterality: Right;    PORTA CATH INSERTION N/A 10/05/2020   Procedure: PORTA CATH INSERTION;  Surgeon: Shawl Cordella KANDICE, MD;  Location: [facility] INVASIVE CV LAB;  Service: Cardiovascular;  Laterality: N/A;   stent placement in right leg Right    VIDEO BRONCHOSCOPY N/A 09/14/2020   Procedure: VIDEO BRONCHOSCOPY WITH FLUORO;  Surgeon: Tamea Dedra CROME, MD;  Location: [facility] ORS;  Service: Cardiopulmonary;  Laterality: N/A;   VIDEO BRONCHOSCOPY WITH ENDOBRONCHIAL ULTRASOUND N/A 09/14/2020   Procedure: VIDEO BRONCHOSCOPY WITH ENDOBRONCHIAL ULTRASOUND;  Surgeon: Tamea Dedra CROME, MD;  Location: [facility] ORS;  Service: Cardiopulmonary;  Laterality: N/A;    SOCIAL HISTORY: Social History   Socioeconomic History   Marital status: Single    Spouse name: Not on file   Number of children: Not on file   Years of education: Not on file   Highest education level: Not on file  Occupational History   Occupation: unemployed  Tobacco Use   Smoking status: Former    Current packs/day: 0.00    Average packs/day: 2.0 packs/day for 38.0 years (76.0 ttl pk-yrs)    Types: Cigarettes    Start date: 12/19/1976    Quit date: 12/20/2014    Years since quitting: 9.2   Smokeless tobacco: Former   Tobacco comments:  quit smoking in 2017 most smoked 2   Vaping Use   Vaping status: Never Used  Substance and Sexual Activity   Alcohol  use: Not Currently   Drug use: No   Sexual activity: Yes  Other Topics Concern   Not on file  Social History Narrative   Not on file   Social Drivers of Health   Financial Resource Strain: Medium Risk (02/05/2024)   Overall Financial Resource Strain (CARDIA)    Difficulty of Paying Living Expenses: Somewhat hard  Food Insecurity: No Food Insecurity (02/05/2024)   Hunger Vital Sign    Worried About Running Out of Food in the Last Year: Never true    Ran Out of Food in the Last Year: Never true  Transportation Needs: No Transportation Needs (02/05/2024)   PRAPARE - Scientist, research (physical sciences) (Medical): No    Lack of Transportation (Non-Medical): No  Physical Activity: Inactive (02/05/2024)   Exercise Vital Sign    Days of Exercise per Week: 1 day    Minutes of Exercise per Session: 0 min  Stress: No Stress Concern Present (02/05/2024)   [name] [employer] - Occupational Stress Questionnaire    Feeling of Stress: Only a little  Social Connections: Socially Isolated (02/05/2024)   Social Connection and Isolation Panel    Frequency of Communication with Friends and Family: Three times a week    Frequency of Social Gatherings with Friends and Family: More than three times a week    Attends Religious Services: Never    Database administrator or Organizations: No    Attends Engineer, structural: Not on file    Marital Status: Never married  Intimate Partner Violence: Not At Risk (12/18/2023)   Humiliation, Afraid, Rape, and Kick questionnaire    Fear of Current or Ex-Partner: No    Emotionally Abused: No    Physically Abused: No    Sexually Abused: No    FAMILY HISTORY: Family History  Problem Relation Age of Onset   Diabetes Mother    Hypertension Mother    Heart murmur Mother    Leukemia Mother    Throat cancer Maternal Grandmother    Colon cancer Neg Hx    Breast cancer Neg Hx     ALLERGIES:  has no known allergies.  MEDICATIONS:  Current Outpatient Medications  Medication Sig Dispense Refill   aspirin  EC 81 MG tablet Take 81 mg by mouth daily. Swallow whole.     clopidogrel  (PLAVIX ) 75 MG tablet TAKE 1 TABLET BY MOUTH DAILY 100 tablet 2   levETIRAcetam  (KEPPRA ) 1000 MG tablet Take 1 tablet (1,000 mg total) by mouth 2 (two) times daily. 60 tablet 2   levothyroxine  (SYNTHROID ) 125 MCG tablet Take 1 tablet (125 mcg total) by mouth daily before breakfast. 90 tablet 0   mupirocin  ointment (BACTROBAN ) 2 % Apply 1 Application topically daily. 22 g 5   potassium chloride  SA (KLOR-CON  M) 20 MEQ tablet Take 1 tablet (20 mEq  total) by mouth daily. 30 tablet 0   rosuvastatin  (CRESTOR ) 40 MG tablet Take 1 tablet (40 mg total) by mouth daily. 90 tablet 3   No current facility-administered medications for this visit.   Facility-Administered Medications Ordered in Other Visits  Medication Dose Route Frequency Provider Last Rate Last Admin   albuterol  (PROVENTIL ) (2.5 MG/3ML) 0.083% nebulizer solution 2.5 mg  2.5 mg Nebulization Once Tamea Dedra CROME, MD       heparin  lock flush 100  unit/mL  500 Units Intracatheter Once PRN [name], MD         PHYSICAL EXAMINATION: ECOG PERFORMANCE STATUS: 1 - Symptomatic but completely ambulatory Vitals:   03/16/24 0841  BP: 97/83  Pulse: (!) 108  Resp: 18  Temp: 97.9 F (36.6 C)  SpO2: 100%    Filed Weights   03/16/24 0841  Weight: 189 lb (85.7 kg)     Physical Exam Constitutional:      General: He is not in acute distress.    Appearance: He is obese.  HENT:     Head: Normocephalic and atraumatic.  Eyes:     General: No scleral icterus. Cardiovascular:     Rate and Rhythm: Normal rate and regular rhythm.  Pulmonary:     Effort: Pulmonary effort is normal. No respiratory distress.     Breath sounds: Normal breath sounds. No wheezing.  Abdominal:     General: There is no distension.     Palpations: Abdomen is soft.     Tenderness: There is no abdominal tenderness.  Musculoskeletal:     Cervical back: Normal range of motion and neck supple.     Comments: Right lower extremity wrapped.   Skin:    General: Skin is warm and dry.     Findings: Bruising present. No erythema.  Neurological:     Mental Status: He is alert and oriented to person, place, and time. Mental status is at baseline.     Cranial Nerves: No cranial nerve deficit.     Motor: No abnormal muscle tone.  Psychiatric:        Mood and Affect: Mood and affect normal.         LABORATORY DATA:  I have reviewed the data as listed    Latest Ref Rng & Units 03/16/2024    8:23 AM  03/02/2024    8:08 AM 02/15/2024    8:33 AM  CBC  WBC 4.0 - 10.5 K/uL 5.0  4.6  5.8   Hemoglobin 13.0 - 17.0 g/dL 86.7  86.8  86.4   Hematocrit 39.0 - 52.0 % 41.6  40.9  42.6   Platelets 150 - 400 K/uL 301  247  286       Latest Ref Rng & Units 03/16/2024    8:23 AM 03/04/2024   12:50 PM 03/02/2024    8:08 AM  CMP  Glucose 70 - 99 mg/dL 872   885   BUN 8 - 23 mg/dL 5  9  6    Creatinine 0.61 - 1.24 mg/dL 9.12  9.06  9.12   Sodium 135 - 145 mmol/L 138   140   Potassium 3.5 - 5.1 mmol/L 3.6   3.6   Chloride 98 - 111 mmol/L 104   106   CO2 22 - 32 mmol/L 23   25   Calcium  8.9 - 10.3 mg/dL 9.0   8.9   Total Protein 6.5 - 8.1 g/dL 6.5   6.2   Total Bilirubin 0.0 - 1.2 mg/dL 0.6   0.8   Alkaline Phos 38 - 126 U/L 82   76   AST 15 - 41 U/L 29   28   ALT 0 - 44 U/L 9   12      RADIOGRAPHIC STUDIES: I have personally reviewed the radiological images as listed and agreed with the findings in the report. PERIPHERAL VASCULAR CATHETERIZATION Result Date: 03/04/2024 See surgical note for result.  CT CHEST ABDOMEN PELVIS W CONTRAST Result Date: 03/01/2024  CLINICAL DATA:  Lung cancer. Assess treatment response. * Tracking Code: BO * EXAM: CT CHEST, ABDOMEN, AND PELVIS WITH CONTRAST TECHNIQUE: Multidetector CT imaging of the chest, abdomen and pelvis was performed following the standard protocol during bolus administration of intravenous contrast. RADIATION DOSE REDUCTION: This exam was performed according to the departmental dose-optimization program which includes automated exposure control, adjustment of the mA and/or kV according to patient size and/or use of iterative reconstruction technique. CONTRAST:  OMNIPAQUE  IOHEXOL  300 MG/ML  SOLN COMPARISON:  CT 526 1,025 com PET-CT 2000 FINDINGS: CT CHEST FINDINGS Cardiovascular: Port in the anterior chest wall with tip in distal SVC. No significant vascular findings. Normal heart size. No pericardial effusion. Mediastinum/Nodes: No axillary or  supraclavicular adenopathy. No mediastinal or hilar adenopathy. No pericardial fluid. Esophagus normal. Lungs/Pleura: Post RIGHT upper lobe resection. RIGHT suprahilar peribronchial thickening unchanged from prior. No new nodularity the RIGHT lung. LEFT lung clear. Musculoskeletal: No aggressive osseous lesion. CT ABDOMEN AND PELVIS FINDINGS Hepatobiliary: No focal hepatic lesion. No biliary ductal dilatation. Gallbladder is normal. Common bile duct is normal. Pancreas: Pancreas is normal. No ductal dilatation. No pancreatic inflammation. Spleen: Normal spleen Adrenals/urinary tract: Adrenal glands and kidneys are normal. The ureters and bladder normal. Stomach/Bowel: Stomach, small bowel, appendix, and cecum are normal. Multiple diverticula of the descending colon and sigmoid colon without acute inflammation. Vascular/Lymphatic: Abdominal aorta is normal caliber. There is no retroperitoneal or periportal lymphadenopathy. No pelvic lymphadenopathy. Reproductive: Unremarkable Other: No free fluid. Musculoskeletal: No aggressive osseous lesion. IMPRESSION: CHEST: 1. Post RIGHT upper lobe resection. 2. No evidence of lung cancer recurrence. 3. No mediastinal adenopathy. PELVIS: 1. No evidence of metastatic disease in the abdomen pelvis. 2. No skeletal metastasis. Electronically Signed   By: Jackquline Boxer M.D.   On: 03/01/2024 16:37   VAS US  ABI WITH/WO TBI Result Date: 02/29/2024  LOWER EXTREMITY DOPPLER STUDY Patient Name:  Joshua [name]  Date of Exam:   02/25/2024 Medical Rec #: [id number]            Accession #:    [id number] Date of Birth: August 15, 1955           Patient Gender: M Patient Age:   74 years Exam Location:  [facility] Procedure:      VAS US  ABI WITH/WO TBI Referring Phys: --------------------------------------------------------------------------------  Indications: Ulceration, peripheral artery disease, and Routine f/u. Patient              reports no claudication or lower extremity  pain. High Risk Factors: Hyperlipidemia, past history of smoking. Other Factors: ASO s/p multiple interventions and bypass.                11/15/2019 Right thrombectomy of Fempop insitu graft with added                stent throughout.                 12/01/2019: Aortogram and Selective Right Lower Extremity                Angiogram. 6 mg of TPA delivered in a lysis catheter the Origin                of the Right Femoral -Popliteal bypass down the proximal ATA with                placement of the lysis.  12/02/2019: PTA of the Right Posterior Tibial Artery. PTA of the                Right Peroneal Artery. Mechanical Thrombectomy of the Right                Femoral -Popliteal Bypass graft. Mechanical Thrombectomy of the                Right Tibioperoneal Trunk and Posterior Tibial Artery.                 09/25/2021: Right Lower Extremity Angiography third order                catheter placement. PTA of the proximal peroneal and                tibioperoneal trunk to 2.5 mm with an ultra score balloon. PTA of                the Femoral Artery and the proximal anastomosis of the bypass                graft to 4 mm with an ultra score balloon. Administration of 5 mg                of tpa throughout the entire bypass and in the peroneal Artery.                Initiation of continuous tpa infusion Right lower Extremity for                limb salvage. Insertion of a triple lumen catheter Left CFV with                US  and fluoroscopic guidance.                 09/26/2021: Right LE Angiogram. PTA of the Right Posterior Tibial                Artery with 2.5 mm diameter by 30 cm length angioplasty balloon.                PTA of the distal bypass anastomosis and distal bypass graft                analogous to the Popliteal artery with 4 mm diameter by 8 cm                length Lutonix drug coated angioplasty balloon.                04/08/2022 right thrombectomy of the Deep profunda.  Vascular Interventions: Hx  of multiple right lower extremity PTA's/stents.                         09/10/2016 Right femoral to below the knee popliteal                         artery bypass graft placement. 5/01 Right bypass graft &                         mid popliteal artery PTA & coil embolectomy of large                         branch of bypass graft. 12/09/16 Right distal bypass  graft PTA/stent and right posterior tibial artery PTA.                         03/23/2020 Rt PTA thrombectomy. Rt Profunda fem artery to                         PTA bypass. Performing Technologist: Donnice Charnley RVT  Examination Guidelines: A complete evaluation includes at minimum, Doppler waveform signals and systolic blood pressure reading at the level of bilateral brachial, anterior tibial, and posterior tibial arteries, when vessel segments are accessible. Bilateral testing is considered an integral part of a complete examination. Photoelectric Plethysmograph (PPG) waveforms and toe systolic pressure readings are included as required and additional duplex testing as needed. Limited examinations for reoccurring indications may be performed as noted.  ABI Findings: +---------+------------------+-----+----------+--------+ Right    Rt Pressure (mmHg)IndexWaveform  Comment  +---------+------------------+-----+----------+--------+ Brachial 167                                       +---------+------------------+-----+----------+--------+ PTA      61                0.37 monophasic         +---------+------------------+-----+----------+--------+ DP       54                0.32 monophasic         +---------+------------------+-----+----------+--------+ Great Toe43                0.26                    +---------+------------------+-----+----------+--------+ +---------+------------------+-----+---------+-------+ Left     Lt Pressure (mmHg)IndexWaveform Comment  +---------+------------------+-----+---------+-------+ Brachial 164                                     +---------+------------------+-----+---------+-------+ PTA      165               0.99 biphasic         +---------+------------------+-----+---------+-------+ DP       189               1.13 triphasic        +---------+------------------+-----+---------+-------+ Great Toe125               0.75                  +---------+------------------+-----+---------+-------+ +-------+-----------+-----------+------------+------------+ ABI/TBIToday's ABIToday's TBIPrevious ABIPrevious TBI +-------+-----------+-----------+------------+------------+ Right  0.37       0.26       0.44        0.24         +-------+-----------+-----------+------------+------------+ Left   1.13       0.75       1.13        0.61         +-------+-----------+-----------+------------+------------+ Bilateral ABIs appear essentially unchanged compared to prior study on 07/09/2023.  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 The right toe-brachial index is abnormal. Left: Resting left ankle-brachial index is within normal range. The left toe-brachial index is normal. *See table(s) above for measurements and observations.  Electronically signed by [name] MD on 02/29/2024 at 7:39:27 AM.  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 EXAM: MRI HEAD WITHOUT AND WITH CONTRAST TECHNIQUE: Multiplanar, multiecho pulse sequences of the brain and surrounding structures were obtained without and with intravenous contrast. CONTRAST:  9mL GADAVIST  GADOBUTROL  1 MMOL/ML IV SOLN COMPARISON:  MRI of the brain dated November 06, 2023. FINDINGS: Brain: A centrally necrotic, peripherally enhancing lesion within the left parietal lobe has decreased in size in the  interim from approximately 3.5 x 2.9 x 2.4 cm to approximately 2.7 x 2.0 x 1.8 cm. A lesion within the left frontal operculum as marginally decreased in size in the interim from 1.4 x 1.4 x 1.6 cm to approximately 1.2 x 1.2 x 1.3 cm. A and nodule previously noted within the right occipital lobe has also marginally in decrease in size in the interim from 8 mm in diameter to 7 mm. The linear area of enhancement previously noted along the left inferior parietal lobe appears represent a vessel. There are no new or enlarging lesions present. There is moderate periventricular and deep cerebral white matter disease. There is gliosis also noted adjacent to the necrotic lesion within the left parietal lobe. Vascular: Normal vascular flow voids. Skull and upper cervical spine: No osseous lesions are evident. Normal bone marrow signal. Sinuses/Orbits: Mild mucosal disease within the right maxillary sinus. The orbits are unremarkable. Other: None. IMPRESSION: 1. Interval improvement of metastatic disease within the left parietal, left frontal and right occipital lobes. Electronically Signed   By: Evalene Coho M.D.   On: 01/04/2024 13:17   DG Foot 2 Views Right Result Date: 12/25/2023 CLINICAL DATA:  Foot wound EXAM: RIGHT FOOT - 2 VIEW COMPARISON:  None Available. FINDINGS: There is no evidence of fracture or dislocation. There is no evidence of arthropathy or other focal bone abnormality. Soft tissues soft tissue swelling of the dorsum IMPRESSION: Soft tissue swelling of the dorsum of the foot. Electronically Signed   By: Franky Chard M.D.   On: 12/25/2023 13:05   DG Foot 2 Views Right Result Date: 12/17/2023 CLINICAL DATA:  Cellulitis. Right foot pain for 1 month. Wound to big toe. Redness and swelling. EXAM: RIGHT FOOT - 2 VIEW COMPARISON:  None Available. FINDINGS: Generalized soft tissue edema. Site of wound is not well delineated by radiograph. No erosive or bony destructive change. No fracture. Mild  degenerative change of the first metatarsal phalangeal joint. No soft tissue gas or radiopaque foreign body. Small plantar calcaneal spur. IMPRESSION: 1. Generalized soft tissue edema. No radiographic evidence of osteomyelitis. 2. Mild degenerative change of the first metatarsophalangeal joint. Electronically Signed   By: Andrea Gasman M.D.   On: 12/17/2023 23:29   DG Chest 2 View Result Date: 12/17/2023 CLINICAL DATA:  Suspected sepsis EXAM: CHEST - 2 VIEW COMPARISON:  02/10/2022 FINDINGS: Right Port-A-Cath remains in place, unchanged. Heart mediastinal contours within normal limits. No acute confluent airspace opacities or effusions. Post treatment scarring noted in the right suprahilar region, unchanged. No acute bony abnormality. IMPRESSION: No active cardiopulmonary disease. Electronically Signed   By: Franky Crease M.D.   On: 12/17/2023 21:05

## 2024-03-16 NOTE — Assessment & Plan Note (Signed)
Durvalumab treatment as planned.  

## 2024-03-16 NOTE — Assessment & Plan Note (Signed)
 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 Dewey.  PET brain finding suggests recurrence -NeuroOnc recommend Radiation and Bevacizumab   Discussed with [name].   MRI brain showed good response.  On Bevacizumab  monthly, last dose 9/8

## 2024-03-16 NOTE — Assessment & Plan Note (Signed)
 Follow up with [name].  Continue keppra 

## 2024-03-17 ENCOUNTER — Inpatient Hospital Stay

## 2024-03-17 NOTE — Progress Notes (Signed)
 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 Past medical history of COPD, HTN, PVD, pre-DM, sleep apnea, multiple ulcers  Patient receiving durvalumab . Planning last dose of bevacizumab  on 9/8.  Spoke with patient via phone for nutrition assessment.  Reports that his appetite has been down for the last month or 2.  Says that the smell of grease makes him sick to his stomach.  Has not taken nausea medication. Lives alone.  Says that he is able to eat a [employer] hamburger but not a [employer] one.  Has gotten shakes from [employer] that are 30gm protein and is drinking those.  Likes chocolate milk, cheese.  Can eat eggs and peanut butter.  Bread does not taste very good to him.  Snacks on almonds.     Medications: reviewed  Labs: reviewed  Anthropometrics:   Height: 71 inches Weight: 189 lb 222 lb 3.2 oz on 5/14 BMI: 26  17% weight loss in the last 3 months, significant (some edema)  Estimated Energy Needs  Kcals: 2100-2500 Protein: 85-102 g Fluid: 2100-2500 ml  NUTRITION DIAGNOSIS: Inadequate oral intake related to cancer and related treatment side effects as evidenced by 17% weight loss and decreased intake    INTERVENTION:  Encouraged trying antinausea medication Discussed foods that don't have a smell (likes pimento cheese, cheese) Encouraged oral nutrition supplement as often as tolerated Encouraged patient eating/nibbling q 2 hours Contact number provided    MONITORING, EVALUATION, GOAL: weight trends, intake   NEXT VISIT: phone call Thursday, Sept 11  Mariangel Ringley B. Dasie SOLON, CSO, LDN Registered Dietitian [phone]

## 2024-03-23 ENCOUNTER — Encounter

## 2024-03-24 ENCOUNTER — Other Ambulatory Visit: Payer: Self-pay

## 2024-03-24 ENCOUNTER — Other Ambulatory Visit: Payer: Self-pay | Admitting: Oncology

## 2024-03-24 ENCOUNTER — Telehealth (INDEPENDENT_AMBULATORY_CARE_PROVIDER_SITE_OTHER): Payer: Self-pay

## 2024-03-24 MED ORDER — POTASSIUM CHLORIDE CRYS ER 20 MEQ PO TBCR
20.0000 meq | EXTENDED_RELEASE_TABLET | Freq: Every day | ORAL | 2 refills | Status: DC
  Filled 2024-03-24 – 2024-03-25 (×2): qty 30, 30d supply, fill #0
  Filled 2024-04-26: qty 30, 30d supply, fill #1
  Filled 2024-05-25: qty 30, 30d supply, fill #2

## 2024-03-24 MED ORDER — LEVOTHYROXINE SODIUM 125 MCG PO TABS
125.0000 ug | ORAL_TABLET | Freq: Every day | ORAL | 0 refills | Status: DC
  Filled 2024-03-24: qty 90, 90d supply, fill #0

## 2024-03-24 NOTE — Telephone Encounter (Signed)
 Spoke with the patient and he is scheduled with Dr. Jama on 03/25/24 with a 6:45 am arrival time to the [facility] for a RLE angio with pedal access. Pre-procedure instructions were discussed and will be sent to Mychart.

## 2024-03-25 ENCOUNTER — Encounter
Admission: RE | Disposition: A | Discharge status: Home / Self Care | Payer: Self-pay | Source: Home / Self Care | Attending: Vascular Surgery

## 2024-03-25 ENCOUNTER — Encounter: Payer: Self-pay | Admitting: Vascular Surgery

## 2024-03-25 ENCOUNTER — Observation Stay
Admission: RE | Admit: 2024-03-25 | Discharge: 2024-03-27 | Disposition: A | Discharge status: Home / Self Care | Attending: Vascular Surgery | Admitting: Vascular Surgery

## 2024-03-25 ENCOUNTER — Other Ambulatory Visit: Payer: Self-pay

## 2024-03-25 DIAGNOSIS — L97919 Non-pressure chronic ulcer of unspecified part of right lower leg with unspecified severity: Secondary | ICD-10-CM

## 2024-03-25 DIAGNOSIS — Z87891 Personal history of nicotine dependence: Secondary | ICD-10-CM | POA: Diagnosis not present

## 2024-03-25 DIAGNOSIS — L97519 Non-pressure chronic ulcer of other part of right foot with unspecified severity: Secondary | ICD-10-CM | POA: Insufficient documentation

## 2024-03-25 DIAGNOSIS — C7800 Secondary malignant neoplasm of unspecified lung: Secondary | ICD-10-CM | POA: Diagnosis not present

## 2024-03-25 DIAGNOSIS — J449 Chronic obstructive pulmonary disease, unspecified: Secondary | ICD-10-CM | POA: Diagnosis not present

## 2024-03-25 DIAGNOSIS — M5416 Radiculopathy, lumbar region: Secondary | ICD-10-CM | POA: Diagnosis not present

## 2024-03-25 DIAGNOSIS — I70239 Atherosclerosis of native arteries of right leg with ulceration of unspecified site: Secondary | ICD-10-CM | POA: Diagnosis not present

## 2024-03-25 DIAGNOSIS — L97909 Non-pressure chronic ulcer of unspecified part of unspecified lower leg with unspecified severity: Principal | ICD-10-CM | POA: Diagnosis present

## 2024-03-25 DIAGNOSIS — Z79899 Other long term (current) drug therapy: Secondary | ICD-10-CM | POA: Diagnosis not present

## 2024-03-25 DIAGNOSIS — I743 Embolism and thrombosis of arteries of the lower extremities: Secondary | ICD-10-CM

## 2024-03-25 DIAGNOSIS — I10 Essential (primary) hypertension: Secondary | ICD-10-CM | POA: Insufficient documentation

## 2024-03-25 DIAGNOSIS — Z95828 Presence of other vascular implants and grafts: Secondary | ICD-10-CM | POA: Insufficient documentation

## 2024-03-25 DIAGNOSIS — I70235 Atherosclerosis of native arteries of right leg with ulceration of other part of foot: Secondary | ICD-10-CM | POA: Diagnosis not present

## 2024-03-25 DIAGNOSIS — I70223 Atherosclerosis of native arteries of extremities with rest pain, bilateral legs: Secondary | ICD-10-CM | POA: Diagnosis present

## 2024-03-25 DIAGNOSIS — Z79811 Long term (current) use of aromatase inhibitors: Secondary | ICD-10-CM | POA: Diagnosis not present

## 2024-03-25 DIAGNOSIS — Z9889 Other specified postprocedural states: Secondary | ICD-10-CM | POA: Diagnosis not present

## 2024-03-25 DIAGNOSIS — I70339 Atherosclerosis of unspecified type of bypass graft(s) of the right leg with ulceration of unspecified site: Secondary | ICD-10-CM | POA: Diagnosis not present

## 2024-03-25 DIAGNOSIS — E782 Mixed hyperlipidemia: Secondary | ICD-10-CM | POA: Diagnosis not present

## 2024-03-25 DIAGNOSIS — Z7982 Long term (current) use of aspirin: Secondary | ICD-10-CM | POA: Insufficient documentation

## 2024-03-25 DIAGNOSIS — R7303 Prediabetes: Secondary | ICD-10-CM | POA: Insufficient documentation

## 2024-03-25 HISTORY — PX: LOWER EXTREMITY ANGIOGRAPHY: CATH118251

## 2024-03-25 LAB — PROTIME-INR
INR: 1.1 (ref 0.8–1.2)
Prothrombin Time: 15.2 s (ref 11.4–15.2)

## 2024-03-25 LAB — CBC
HCT: 36.1 % — ABNORMAL LOW (ref 39.0–52.0)
Hemoglobin: 11.3 g/dL — ABNORMAL LOW (ref 13.0–17.0)
MCH: 26.2 pg (ref 26.0–34.0)
MCHC: 31.3 g/dL (ref 30.0–36.0)
MCV: 83.6 fL (ref 80.0–100.0)
Platelets: 222 K/uL (ref 150–400)
RBC: 4.32 MIL/uL (ref 4.22–5.81)
RDW: 14.7 % (ref 11.5–15.5)
WBC: 5.7 K/uL (ref 4.0–10.5)
nRBC: 0 % (ref 0.0–0.2)

## 2024-03-25 LAB — HEPARIN LEVEL (UNFRACTIONATED): Heparin Unfractionated: 0.96 [IU]/mL — ABNORMAL HIGH (ref 0.30–0.70)

## 2024-03-25 LAB — CREATININE, SERUM
Creatinine, Ser: 0.87 mg/dL (ref 0.61–1.24)
GFR, Estimated: >60 mL/min (ref >60)

## 2024-03-25 LAB — APTT: aPTT: >200 s (ref 24–36)

## 2024-03-25 LAB — BUN: BUN: 9 mg/dL (ref 8–23)

## 2024-03-25 SURGERY — LOWER EXTREMITY ANGIOGRAPHY
Anesthesia: Moderate Sedation | Site: Leg Lower | Laterality: Right

## 2024-03-25 MED ORDER — LABETALOL HCL 5 MG/ML IV SOLN: 10.0000 mg | INTRAVENOUS | Status: DC | PRN

## 2024-03-25 MED ORDER — FAMOTIDINE 20 MG PO TABS: 40.0000 mg | ORAL_TABLET | Freq: Once | ORAL | Status: DC | PRN

## 2024-03-25 MED ORDER — METHYLPREDNISOLONE SODIUM SUCC 125 MG IJ SOLR: 125.0000 mg | Freq: Once | INTRAMUSCULAR | Status: DC | PRN

## 2024-03-25 MED ORDER — HEPARIN (PORCINE) 25000 UT/250ML-% IV SOLN
900.0000 [IU]/h | INTRAVENOUS | Status: AC
  Administered 2024-03-25: 1200 [IU]/h via INTRAVENOUS
  Administered 2024-03-26: 900 [IU]/h via INTRAVENOUS
  Filled 2024-03-25: qty 250

## 2024-03-25 MED ORDER — LEVOTHYROXINE SODIUM 125 MCG PO TABS
125.0000 ug | ORAL_TABLET | Freq: Every day | ORAL | Status: DC
  Administered 2024-03-26 – 2024-03-27 (×2): 125 ug via ORAL
  Filled 2024-03-25 (×2): qty 1

## 2024-03-25 MED ORDER — ONDANSETRON HCL 4 MG/2ML IJ SOLN: 4.0000 mg | Freq: Four times a day (QID) | INTRAMUSCULAR | Status: DC | PRN

## 2024-03-25 MED ORDER — PNEUMOCOCCAL 20-VAL CONJ VACC 0.5 ML IM SUSY
0.5000 mL | PREFILLED_SYRINGE | INTRAMUSCULAR | Status: DC
  Filled 2024-03-25: qty 0.5

## 2024-03-25 MED ORDER — INFLUENZA VAC SPLIT HIGH-DOSE 0.5 ML IM SUSY
0.5000 mL | PREFILLED_SYRINGE | INTRAMUSCULAR | Status: DC
  Filled 2024-03-25: qty 0.5

## 2024-03-25 MED ORDER — CEFAZOLIN SODIUM-DEXTROSE 2-4 GM/100ML-% IV SOLN
2.0000 g | INTRAVENOUS | Status: AC
  Administered 2024-03-25: 2 g via INTRAVENOUS

## 2024-03-25 MED ORDER — HYDRALAZINE HCL 20 MG/ML IJ SOLN: 5.0000 mg | INTRAMUSCULAR | Status: DC | PRN

## 2024-03-25 MED ORDER — CLOPIDOGREL BISULFATE 75 MG PO TABS
75.0000 mg | ORAL_TABLET | Freq: Every day | ORAL | Status: DC
  Administered 2024-03-26 – 2024-03-27 (×2): 75 mg via ORAL
  Filled 2024-03-25 (×2): qty 1

## 2024-03-25 MED ORDER — MIDAZOLAM HCL 2 MG/2ML IJ SOLN
INTRAMUSCULAR | Status: AC
  Filled 2024-03-25: qty 2

## 2024-03-25 MED ORDER — SODIUM CHLORIDE 0.9 % IV SOLN: 250.0000 mL | INTRAVENOUS | Status: AC | PRN

## 2024-03-25 MED ORDER — MIDAZOLAM HCL 5 MG/5ML IJ SOLN
INTRAMUSCULAR | Status: AC
  Filled 2024-03-25: qty 5

## 2024-03-25 MED ORDER — MORPHINE SULFATE (PF) 2 MG/ML IV SOLN: 2.0000 mg | INTRAVENOUS | Status: DC | PRN

## 2024-03-25 MED ORDER — SODIUM CHLORIDE 0.9% FLUSH
3.0000 mL | Freq: Two times a day (BID) | INTRAVENOUS | Status: DC
  Administered 2024-03-25 – 2024-03-26 (×2): 3 mL via INTRAVENOUS

## 2024-03-25 MED ORDER — FENTANYL CITRATE (PF) 100 MCG/2ML IJ SOLN
INTRAMUSCULAR | Status: AC
Start: 2024-03-25 — End: 2024-03-25
  Filled 2024-03-25: qty 2

## 2024-03-25 MED ORDER — LIDOCAINE HCL (PF) 1 % IJ SOLN
INTRAMUSCULAR | Status: DC | PRN
  Administered 2024-03-25: 10 mL

## 2024-03-25 MED ORDER — MIDAZOLAM HCL 2 MG/ML PO SYRP: 8.0000 mg | ORAL_SOLUTION | Freq: Once | ORAL | Status: DC | PRN

## 2024-03-25 MED ORDER — ASPIRIN 81 MG PO TBEC
81.0000 mg | DELAYED_RELEASE_TABLET | Freq: Every day | ORAL | Status: DC
  Administered 2024-03-25 – 2024-03-27 (×3): 81 mg via ORAL
  Filled 2024-03-25 (×3): qty 1

## 2024-03-25 MED ORDER — FENTANYL CITRATE (PF) 100 MCG/2ML IJ SOLN
INTRAMUSCULAR | Status: DC | PRN
  Administered 2024-03-25 (×2): 25 ug via INTRAVENOUS
  Administered 2024-03-25: 50 ug via INTRAVENOUS
  Administered 2024-03-25: 37.5 ug via INTRAVENOUS
  Administered 2024-03-25: 50 ug via INTRAVENOUS
  Administered 2024-03-25: 12.5 ug via INTRAVENOUS

## 2024-03-25 MED ORDER — FENTANYL CITRATE (PF) 100 MCG/2ML IJ SOLN
INTRAMUSCULAR | Status: AC
  Filled 2024-03-25: qty 2

## 2024-03-25 MED ORDER — HEPARIN BOLUS VIA INFUSION
2600.0000 [IU] | Freq: Once | INTRAVENOUS | Status: AC
  Filled 2024-03-25: qty 2600

## 2024-03-25 MED ORDER — DIPHENHYDRAMINE HCL 50 MG/ML IJ SOLN: 50.0000 mg | Freq: Once | INTRAMUSCULAR | Status: DC | PRN

## 2024-03-25 MED ORDER — ACETAMINOPHEN 325 MG PO TABS
650.0000 mg | ORAL_TABLET | ORAL | Status: DC | PRN
Start: 2024-03-25 — End: 2024-03-27

## 2024-03-25 MED ORDER — LEVETIRACETAM 500 MG PO TABS
1000.0000 mg | ORAL_TABLET | Freq: Two times a day (BID) | ORAL | Status: DC
  Administered 2024-03-25 – 2024-03-27 (×4): 1000 mg via ORAL
  Filled 2024-03-25 (×4): qty 2

## 2024-03-25 MED ORDER — IODIXANOL 320 MG/ML IV SOLN
INTRAVENOUS | Status: DC | PRN
Start: 2024-03-25 — End: 2024-03-25
  Administered 2024-03-25: 100 mL

## 2024-03-25 MED ORDER — HEPARIN (PORCINE) 25000 UT/250ML-% IV SOLN
INTRAVENOUS | Status: AC
  Administered 2024-03-25: 2600 [IU] via INTRAVENOUS
  Filled 2024-03-25: qty 250

## 2024-03-25 MED ORDER — ROSUVASTATIN CALCIUM 20 MG PO TABS
40.0000 mg | ORAL_TABLET | Freq: Every day | ORAL | Status: DC
  Administered 2024-03-25 – 2024-03-27 (×3): 40 mg via ORAL
  Filled 2024-03-25: qty 2
  Filled 2024-03-25: qty 4
  Filled 2024-03-25: qty 2
  Filled 2024-03-25: qty 4
  Filled 2024-03-25: qty 2
  Filled 2024-03-25: qty 4

## 2024-03-25 MED ORDER — HEPARIN SODIUM (PORCINE) 1000 UNIT/ML IJ SOLN
INTRAMUSCULAR | Status: DC | PRN
  Administered 2024-03-25: 6000 [IU] via INTRAVENOUS
  Administered 2024-03-25: 1000 [IU] via INTRAVENOUS
  Administered 2024-03-25: 2000 [IU] via INTRAVENOUS

## 2024-03-25 MED ORDER — SODIUM CHLORIDE 0.9% FLUSH: 3.0000 mL | INTRAVENOUS | Status: DC | PRN

## 2024-03-25 MED ORDER — HEPARIN SODIUM (PORCINE) 1000 UNIT/ML IJ SOLN
INTRAMUSCULAR | Status: AC
  Filled 2024-03-25: qty 10

## 2024-03-25 MED ORDER — HYDROMORPHONE HCL 1 MG/ML IJ SOLN: 1.0000 mg | Freq: Once | INTRAMUSCULAR | Status: DC | PRN

## 2024-03-25 MED ORDER — OXYCODONE HCL 5 MG PO TABS
5.0000 mg | ORAL_TABLET | ORAL | Status: DC | PRN
  Administered 2024-03-25 – 2024-03-26 (×3): 10 mg via ORAL
  Filled 2024-03-25 (×3): qty 2

## 2024-03-25 MED ORDER — POTASSIUM CHLORIDE CRYS ER 20 MEQ PO TBCR
20.0000 meq | EXTENDED_RELEASE_TABLET | Freq: Every day | ORAL | Status: DC
  Administered 2024-03-26 – 2024-03-27 (×2): 20 meq via ORAL
  Filled 2024-03-25 (×2): qty 1

## 2024-03-25 MED ORDER — SODIUM CHLORIDE 0.9 % IV SOLN: INTRAVENOUS | Status: AC

## 2024-03-25 MED ORDER — MIDAZOLAM HCL 2 MG/2ML IJ SOLN
INTRAMUSCULAR | Status: DC | PRN
  Administered 2024-03-25 (×2): 1 mg via INTRAVENOUS
  Administered 2024-03-25 (×2): .5 mg via INTRAVENOUS
  Administered 2024-03-25: 1 mg via INTRAVENOUS
  Administered 2024-03-25: 2 mg via INTRAVENOUS

## 2024-03-25 MED ORDER — HEPARIN (PORCINE) IN NACL 1000-0.9 UT/500ML-% IV SOLN
INTRAVENOUS | Status: DC | PRN
  Administered 2024-03-25: 1500 mL

## 2024-03-25 MED ORDER — CEFAZOLIN SODIUM-DEXTROSE 2-4 GM/100ML-% IV SOLN
INTRAVENOUS | Status: AC
  Filled 2024-03-25: qty 100

## 2024-03-25 MED ORDER — SODIUM CHLORIDE 0.9 % IV SOLN: INTRAVENOUS | Status: DC

## 2024-03-25 SURGICAL SUPPLY — 44 items
ADAPTER ROTATING Y (ADAPTER) IMPLANT
BALLOON DORADO 6X100X135 (BALLOONS) IMPLANT
BALLOON JADE .014 3.0 X 40 (BALLOONS) IMPLANT
BALLOON JADE .014 4.0 X 40 0 (BALLOONS) IMPLANT
BALLOON LUTONIX 018 4X40X130 (BALLOONS) IMPLANT
BALLOON LUTONIX 018 6X100X130 (BALLOONS) IMPLANT
BALLOON LUTONIX 018 6X300X130 (BALLOONS) IMPLANT
BALLOON LUTONIX DCB 4X60X130 (BALLOONS) IMPLANT
BALLOON LUTONIX DCB 7X40X130 (BALLOONS) IMPLANT
CANISTER PENUMBRA ENGINE (MISCELLANEOUS) IMPLANT
CATH BEACON 5 .038 100 VERT TP (CATHETERS) IMPLANT
CATH BEACON 5.038 65CM KMP-01 (CATHETERS) IMPLANT
CATH LIGHTNING BOLT 7 130 (CATHETERS) IMPLANT
CATH ROTAREX 135 6FR (CATHETERS) IMPLANT
CATH SEEKER .035X90 (CATHETERS) IMPLANT
CATH TEMPO 5F RIM 65CM (CATHETERS) IMPLANT
COVER DRAPE FLUORO 36X44 (DRAPES) IMPLANT
COVER PROBE ULTRASOUND 5X96 (MISCELLANEOUS) IMPLANT
DEVICE PRESTO INFLATION (MISCELLANEOUS) IMPLANT
DEVICE RAD COMP TR BAND LRG (VASCULAR PRODUCTS) IMPLANT
DEVICE STARCLOSE SE CLOSURE (Vascular Products) IMPLANT
DRAPE INCISE IOBAN 66X45 STRL (DRAPES) IMPLANT
GLIDEWIRE ADV .035X260CM (WIRE) IMPLANT
GOWN STRL REUS W/ TWL LRG LVL3 (GOWN DISPOSABLE) ×1 IMPLANT
GUIDEWIRE PFTE-COATED .018X300 (WIRE) IMPLANT
NDL ENTRY 21GA 7CM ECHOTIP (NEEDLE) IMPLANT
NEEDLE ENTRY 21GA 7CM ECHOTIP (NEEDLE) ×2 IMPLANT
PACK ANGIOGRAPHY (CUSTOM PROCEDURE TRAY) ×1 IMPLANT
SET INTRO CAPELLA COAXIAL (SET/KITS/TRAYS/PACK) IMPLANT
SHEATH BRITE TIP 5FRX11 (SHEATH) IMPLANT
SHEATH FLEXOR ANSEL2 7FRX45 (SHEATH) IMPLANT
SHEATH HALO 035 5FRX10 (SHEATH) IMPLANT
SHEATH HALO 035 6FRX10 (SHEATH) IMPLANT
SHEATH MICROPUNCTURE PEDAL 4FR (SHEATH) IMPLANT
STENT ESPRIT BTK 3.7X38 SCAFF (Permanent Stent) IMPLANT
STENT LIFESTAR 9X40 (Permanent Stent) IMPLANT
STENT LIFESTENT 5F 6X40X135 (Permanent Stent) IMPLANT
STENT LIFESTENT 5F 7X100X135 (Permanent Stent) IMPLANT
STENT OTW ESPRIT BTK 3.0X28 (Permanent Stent) IMPLANT
STENT OTW ESPRIT BTK 3.0X38 (Permanent Stent) IMPLANT
STOPCOCK 3WAY MALE LL (IV SETS) IMPLANT
WIRE COMMAND ST STR 014 300 (WIRE) IMPLANT
WIRE G V18X300CM (WIRE) IMPLANT
WIRE J 3MM .035X145CM (WIRE) IMPLANT

## 2024-03-25 NOTE — Consult Note (Signed)
 Pharmacy Consult Note - Anticoagulation  Pharmacy Consult for Heparin  Indication: Atherosclerotic occlusive disease with ulceration No Known Allergies  PATIENT MEASUREMENTS: Height: 5' 11 (180.3 cm) Weight: 85.7 kg (188 lb 15 oz) IBW/kg (Calculated) : 75.3 HEPARIN  DW (KG): 85.7  VITAL SIGNS: Temp: 97.4 F (36.3 C) (09/05 1155) Temp Source: Temporal (09/05 1155) BP: 126/72 (09/05 1230) Pulse Rate: 80 (09/05 1230)  Recent Labs    03/25/24 0724  CREATININE 0.87    Estimated Creatinine Clearance: 90.2 mL/min (by C-G formula based on SCr of 0.87 mg/dL).  PAST MEDICAL HISTORY: Past Medical History:  Diagnosis Date   Allergy    Cancer (HCC)    lung   COPD (chronic obstructive pulmonary disease) (HCC)    Dyspnea    former smoker. 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 Swallow whole.   Past Week   clopidogrel  (PLAVIX ) 75 MG tablet TAKE 1 TABLET BY MOUTH DAILY 100 tablet 2 03/25/2024 Morning   levETIRAcetam  (KEPPRA ) 1000 MG tablet Take 1 tablet (1,000 mg total) by mouth 2 (two) times daily. 60 tablet 2 03/25/2024 Morning   levothyroxine  (SYNTHROID ) 125 MCG tablet Take 1 tablet (125 mcg total) by mouth daily before breakfast. 90 tablet 0 03/25/2024 Morning   mupirocin  ointment (BACTROBAN ) 2 % Apply 1 Application topically daily. 22 g 5 Past Week   potassium chloride  SA (KLOR-CON  M) 20 MEQ tablet Take 1 tablet (20 mEq total) by mouth daily. 30 tablet 2 03/25/2024 Morning   rosuvastatin  (CRESTOR ) 40 MG tablet Take 1 tablet (40 mg total) by mouth daily. 90 tablet 3 03/24/2024   Scheduled:   aspirin  EC  81 mg Oral Daily   heparin    2,600 Units Intravenous Once   levETIRAcetam   1,000 mg Oral BID   [START ON 03/26/2024] levothyroxine   125 mcg Oral QAC breakfast   [START ON 03/26/2024] potassium chloride  SA  20 mEq Oral Daily   rosuvastatin   40 mg Oral Daily   sodium chloride  flush  3 mL Intravenous Q12H   Infusions:   sodium chloride  75 mL/hr at 03/25/24 0746   sodium chloride      sodium chloride      heparin      PRN: sodium chloride , acetaminophen , diphenhydrAMINE , famotidine , fentaNYL , Heparin  (Porcine) in NaCl, heparin , heparin  sodium (porcine), hydrALAZINE , HYDROmorphone  (DILAUDID ) injection, iodixanol , labetalol , lidocaine  (PF), methylPREDNISolone  (SOLU-MEDROL ) injection, midazolam , midazolam , morphine  injection, ondansetron  (ZOFRAN ) IV, oxyCODONE , sodium chloride  flush  ASSESSMENT: 68 y.o. 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 Patient is not on chronic anticoagulation per chart review. Patient was previously on Eliquis  in the past, but is currently off Eliquis . Pharmacy has been consulted to initiate and manage heparin  intravenous infusion.   Goal(s) of therapy: Heparin  level 0.3 - 0.7 units/mL aPTT 66 - 102 seconds Monitor platelets by anticoagulation protocol: Yes   Baseline anticoagulation labs: Pending  No results for input(s): APTT, INR, HGB, PLT in the last 72 hours.      PLAN:  Per [employer]  start at 1/2 bolus  due to ulcers and start infusion at 1245. Will give 2900 units bolus x1; then start heparin  infusion at 1,200 units/hour.  Check heparin  level in 6 hours, then daily once at least two levels are consecutively therapeutic.  Monitor CBC daily while on heparin  infusion.   Annabella LOISE Banks, PharmD Clinical Pharmacist 03/25/2024 12:44 PM

## 2024-03-25 NOTE — TOC CM/SW Note (Signed)
 Transition of Care [facility]) - Inpatient Brief Assessment   Patient Details  Name: LANNIE YUSUF MRN: [id number] Date of Birth: [birth year]  Transition of Care [facility]) CM/SW Contact:    Corean ONEIDA Haddock, RN Phone Number: 03/25/2024, 4:45 PM   Clinical Narrative:    Transition of Care (TOC) Screening Note   Patient Details  Name: EDI GORNIAK Date of Birth: 11/30/1955   Transition of Care [facility]) CM/SW Contact:    Corean ONEIDA Haddock, RN Phone Number: 03/25/2024, 4:45 PM    Transition of Care Department [facility]) has reviewed patient and no TOC needs have been identified at this time.  If new patient transition needs arise, please place a TOC consult.   Transition of Care Asessment: Insurance and Status: Insurance coverage has been reviewed Patient has primary care physician: Yes     Prior/Current Home Services: No current home services Social Drivers of Health Review: SDOH reviewed no interventions necessary Readmission risk has been reviewed: Yes Transition of care needs: no transition of care needs at this time

## 2024-03-25 NOTE — Consult Note (Signed)
 Pharmacy Consult Note - Anticoagulation  Pharmacy Consult for Heparin  Indication: Atherosclerotic occlusive disease with ulceration No Known Allergies  PATIENT MEASUREMENTS: Height: 5' 11 (180.3 cm) Weight: 85.7 kg (188 lb 15 oz) IBW/kg (Calculated) : 75.3 HEPARIN  DW (KG): 85.7  VITAL SIGNS: Temp: 97.9 F (36.6 C) (09/05 1630) Temp Source: Temporal (09/05 1155) BP: 116/64 (09/05 1630) Pulse Rate: 88 (09/05 1630)  Recent Labs    03/25/24 0724 03/25/24 1332 03/25/24 1607 03/25/24 1905  HGB  --  11.3*  --   --   HCT  --  36.1*  --   --   PLT  --  222  --   --   APTT  --   --  >200*  --   LABPROT  --   --  15.2  --   INR  --   --  1.1  --   HEPARINUNFRC  --   --   --  0.96*  CREATININE 0.87  --   --   --     Estimated Creatinine Clearance: 90.2 mL/min (by C-G formula based on SCr of 0.87 mg/dL).  PAST MEDICAL HISTORY: Past Medical History:  Diagnosis Date   Allergy    Cancer (HCC)    lung   COPD (chronic obstructive pulmonary disease) (HCC)    Dyspnea    former smoker. 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 Swallow whole.   Past Week   clopidogrel  (PLAVIX ) 75 MG tablet TAKE 1 TABLET BY MOUTH DAILY 100 tablet 2 03/25/2024 Morning   levETIRAcetam  (KEPPRA ) 1000 MG tablet Take 1 tablet (1,000 mg total) by mouth 2 (two) times daily. 60 tablet 2 03/25/2024 Morning   levothyroxine  (SYNTHROID ) 125 MCG tablet Take 1 tablet (125 mcg total) by mouth daily before breakfast. 90 tablet 0 03/25/2024 Morning   mupirocin  ointment (BACTROBAN ) 2 % Apply 1 Application topically daily. 22 g 5 Past Week   potassium chloride  SA  (KLOR-CON  M) 20 MEQ tablet Take 1 tablet (20 mEq total) by mouth daily. 30 tablet 2 03/25/2024 Morning   rosuvastatin  (CRESTOR ) 40 MG tablet Take 1 tablet (40 mg total) by mouth daily. 90 tablet 3 03/24/2024   Scheduled:   aspirin  EC  81 mg Oral Daily   [START ON 03/26/2024] clopidogrel   75 mg Oral Daily   [START ON 03/26/2024] Influenza vac split trivalent PF  0.5 mL Intramuscular Tomorrow-1000   levETIRAcetam   1,000 mg Oral BID   [START ON 03/26/2024] levothyroxine   125 mcg Oral Q0600   [START ON 03/26/2024] pneumococcal 20-valent conjugate vaccine  0.5 mL Intramuscular Tomorrow-1000   [START ON 03/26/2024] potassium chloride  SA  20 mEq Oral Daily   rosuvastatin   40 mg Oral Daily   sodium chloride  flush  3 mL Intravenous Q12H   Infusions:   sodium chloride      heparin  1,200 Units/hr (03/25/24 1652)   PRN: sodium chloride , acetaminophen , hydrALAZINE , HYDROmorphone  (DILAUDID ) injection, labetalol , morphine  injection, ondansetron  (ZOFRAN ) IV, oxyCODONE , sodium chloride  flush  ASSESSMENT: 68 y.o. 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 Patient is not on chronic anticoagulation per chart review. Patient was previously on Eliquis  in the past, but is currently off Eliquis . Pharmacy has been consulted to initiate and manage heparin  intravenous infusion.   Goal(s) of therapy: Heparin  level 0.3 - 0.7 units/mL aPTT 66 - 102 seconds Monitor platelets by anticoagulation protocol: Yes   Baseline anticoagulation labs: Pending  Recent Labs    03/25/24 1332 03/25/24 1607  APTT  --  >200*  INR  --  1.1  HGB 11.3*  --   PLT 222  --    0905 1905 HL 0.96, SUPRAtherapeutic    PLAN: Decrease heparin  infusion to 1050 units/hour Check heparin  level in 6 hours Monitor heparin  level and CBC daily while on heparin  infusion.  Damien Napoleon, PharmD Clinical  Pharmacist 03/25/2024 7:50 PM

## 2024-03-25 NOTE — Interval H&P Note (Signed)
 History and Physical Interval Note:  03/25/2024 8:40 AM  Joshua [name]  has presented today for surgery, with the diagnosis of RLE Angio w pedal approach    ASO w ulceration.  The various methods of treatment have been discussed with the patient and family. After consideration of risks, benefits and other options for treatment, the patient has consented to  Procedure(s): Lower Extremity Angiography (Right) as a surgical intervention.  The patient's history has been reviewed, patient examined, no change in status, stable for surgery.  I have reviewed the patient's chart and labs.  Questions were answered to the patient's satisfaction.     [name]

## 2024-03-25 NOTE — Op Note (Signed)
 [facility]  Percutaneous Study/Intervention Procedural Note   Date of Surgery: 03/25/2024,12:28 PM  Surgeon:Sheriff Rodenberg, Joshua [name]   Pre-operative Diagnosis: Atherosclerotic occlusive disease right lower extremity with multiple ulcerations  Post-operative diagnosis:  Same  Procedure(s) Performed:  1.  Ultrasound-guided access to the right posterior tibial artery  2.  Right lower extremity angiography  3.  Atherectomy right common femoral artery, femoral-popliteal bypass and distal popliteal arteries with the [place] Rex catheter  4.  Ultrasound-guided access to the left common femoral artery  5.  Mechanical thrombectomy using the penumbra lightening bolt 7 catheter  6.  Percutaneous kissing stents in the profunda femoris and origin of the femoral-popliteal bypass with a 9 x 40 life star stent in the profunda and a 7 x 100 life stent in the femoral-popliteal bypass  7.  Percutaneous angioplasty and stent placement distal bypass and popliteal artery with a 6 mm x 40 mm life stent.             8.  Percutaneous transluminal angioplasty and stent placement right tibioperoneal trunk and posterior tibial artery using 3 Esprit stents postdilated to 4 mm proximally and 3 mm distally.              9.  StarClose left common femoral artery; TR band applied to the right ankle access site  Anesthesia: Conscious sedation was administered by the interventional radiology RN under my direct supervision. IV Versed  plus fentanyl  were utilized. Continuous ECG, pulse oximetry and blood pressure was monitored throughout the entire procedure. Conscious sedation was administered for a total of 182 minutes.  Sheath: 6 French slender retrograde right posterior tibial artery; 7 [place] Ansell sheath retrograde left common femoral artery  Contrast: 85 cc   Fluoroscopy Time: 30 minutes  Indications: Patient has known atherosclerotic occlusive disease of the right lower extremity which has been  stable.  He has had numerous interventions as well as bypass procedures.  Recently he was started on a Avastin  for his metastatic lung cancer.  This has resulted in numerous ulcers of the right lower extremity.  He is undergoing angiography with the hope for intervention for limb salvage.  Risks and benefits have been reviewed all questions answered patient agrees to proceed  Procedure:  Joshua Reznick Blackwellis a 68 y.o. male who was identified and appropriate procedural time out was performed.  The patient was then placed supine on the table and prepped and draped in the usual sterile fashion.  Ultrasound was used to evaluate the right posterior tibial artery distally.  It was echolucent and minimally pulsatile indicating it is patent.  An ultrasound image was acquired for the permanent record.  A micropuncture needle was used to access the right posterior tibial artery under direct ultrasound guidance.  The microwire was then advanced under fluoroscopic guidance without difficulty followed by the micro-sheath.  A 0.035 J wire was advanced without resistance and a 5Fr slender sheath was placed.    Hand-injection contrast is then performed which demonstrates the posterior tibial anatomy up to the level of the tibial plateau.  Using a Kump catheter and an advantage wire both an 035 and 0.018 advantage wire I was able to negotiate the catheter through the disease within the posterior tibial up to the popliteal artery.  Using the Kumpe catheter to steer the wire I attempted to gain access to the native arterial circulation but was not successful.  However, the advantage wire did cross the distal anastomosis of his previous bypass and  after switching from a 65 Kumpe to a 100 Kumpe I was able to negotiate the advantage wire and the Kumpe catheter into the common femoral artery where hand-injection of contrast verified intraluminal positioning of the catheter as well as occlusion of the bypass and the native  SFA.  6000 units of heparin  was given (later in the case an additional 1000 units was given subsequent to that and an additional 2000 units was given).  The Kumpe catheter was then used to exchange for a V18 wire.  The [place] Rex catheter was then prepped on the field advanced up into the common femoral and then a slow steady pullback was performed down to the popliteal artery.  Follow-up imaging demonstrated successful recanalization of the bypass but persistent high grade stenosis was noted at both the origin as well as the distal anastomosis.  Thankfully, the graft between these 2 endpoints the atherosclerotic material was markedly improved.  At this point I selected a 6 mm x 300 mm Lutonix and performed angioplasty from the mid common femoral into the popliteal.  Inflations were to 12 atm for approximately 1 minute.  Follow-up imaging demonstrated recanalization of the bypass with improved flow but persistent greater than 80% stenosis is still noted at the proximal portion of the graft as well as at the distal anastomotic region.  Unfortunately, there did appear to be some embolization into the profunda femoris.  After reviewing these images I felt that the best treatment would be to access the left common femoral and perform thrombectomy of the profunda femoris, the origin of the femoropopliteal bypass could also be treated with thrombectomy to ensure there was no further loose thrombotic material and then perform kissing stents to maintain patency of the profunda femoris as well as create optimal patency at the origin of the femoropopliteal bypass graft.  Ultrasound was reprepped and draped.  The left common femoral artery was evaluated it was echolucent pulsatile indicating patency.  Images recorded for the permanent record.  Microneedle was then inserted followed by microwire followed by the micro sheath.  Advantage wire was then advanced and utilizing a rim catheter the aortic bifurcation was crossed  and the wire advanced down into the profunda femoris.  A 7 [place] Ansell sheath was then advanced to that the tip was in the mid common femoral.  The penumbra lightening bolt 7 catheter was then advanced over the wire into the mid to distal profunda femoris and a total of 4 passes were made.  This successfully recanalized the profunda femoris.  It did show a greater than 70% stenosis in the mid to distal profunda femoris and I advanced a 4 mm x 40 mm Lutonix across this lesion inflated at 10 atm for 1 minute.  Follow-up imaging demonstrated less than 10% residual stenosis.  At this point we now had wire control of the profunda femoris as well as the bypass graft.  Follow-up imaging through the [facility] sheath which had been pulled back into the more proximal common femoral was performed and appropriate measurements were made.  A 9 mm x 40 mm life star stent was then advanced from the left femoral sheath into the profunda femoris and a 7 mm x 100 mm life stent was advanced from the posterior tibial sheath.  The leading edges of the stent were utilized to raise the femoral bifurcation slightly more proximally by about 10 to 15 mm.  Stents were deployed simultaneously.  They were then postdilated using a 7 mm balloon for  the profunda and a 6 mm balloon for the life stent in the femoropopliteal bypass.  Inflations were to 8 atm for approximately 1 minute.  Follow-up imaging through the [facility] sheath not demonstrated wide patency of both the profunda femoris and the proximal bypass with less than 10% residual stenosis.  Distal runoff of the bypass graft was then performed by hand-injection's through the [facility] sheath in the femoral artery and this demonstrated that the bypass was now widely patent until the distal anastomosis where greater than 80% stenosis was present that extended into the tibioperoneal trunk and posterior tibial artery.  At this point it was quite clear that in order to treat the posterior tibial and  the tibioperoneal trunk the Esprit stents would be the proper and most appropriate platform.  However, the distal bypass down to the distal popliteal was a 6 mm Viabahn.  In order to create a proper transition I deployed a 6 mm x 40 mm life stent extending into the distal popliteal deploying it just proximal to the origin of the anterior tibial artery and postdilated with a 4 mm x 40 mm Lutonix drug-eluting balloon inflated to 10 atm for 1 minute.  Next beginning in the posterior tibial artery at the distal end of the lesion I deployed a Esprit 3 mm x 38 mm stent extending more proximally next a 3 mm x 28 mm Esprit stent was deployed overlapping the initial 3 mm stent.  Finally a 3.75 mm x 38 mm Esprit stent was deployed overlapping the life stent approximately and the 3 mm x 28 mm Esprit stent distally.  Next a 4 mm x 40 mm Jade balloon was used to post dilate the 3.75 mm stent inflation was to 16 atm for approximately 1 minute.  Finally a 3 mm x 40 mm Jade balloon was used to post dilate the 2 to 3 mm Esprit stents 2 separate inflations were made both inflations were to 12 atm for approximately 1 minute.  Follow-up imaging was now performed and demonstrated the bypass to be widely patent as well as the distal outflow and the posterior tibial artery.  Attention was then turned to the left common femoral Ansell sheath.  Oblique view was obtained and a StarClose device deployed with good hemostasis.  The posterior tibial sheath was removed and a TR band was applied per protocol with good hemostasis.   Findings:   Right Lower Extremity: The right common femoral demonstrates mild to moderate diffuse disease distally.  The profunda femoris is widely patent proximally.  In its midportion there is a greater than 75% stenosis.  The native SFA demonstrates previous stents but is occluded at its origin and remains occluded throughout its entire course.  The previous femoral to below-knee bypass is occluded previously  noted Viabahn stents are present.  The distal popliteal demonstrates a greater than 80% stenosis which extends through the entire tibioperoneal trunk and into the proximal one third of the posterior tibial artery.  Distally the posterior tibial artery is 2-1/2 to 3 mm and patent all the way to the ankle.  The peroneal artery is occluded in its proximal portion it does reconstitute and is patent down to the ankle.  Posterior tibial artery is patent at its origin but within several millimeters occludes and remains occluded throughout its entire course.    Following atherectomy of the femoral to below-knee popliteal bypass there is now flow but greater than 80% stenosis is persistent both at the origin and at the  distal anastomosis.  Additionally after balloon angioplasty of the graft there is now embolization into the profunda femoris.  Following thrombectomy using the penumbra lightening bolt 7 catheter the profunda femoris is now patent.  Following angioplasty and stent placement of the origin of the profunda femoris and the origin region of the femoral-popliteal bypass there is now wide patency proximally, less than 10% residual stenosis.  The femoral bifurcation has been raised more proximally by approximately 1 to 1-1/2 cm.  There is now forward flow through the bypass graft and the bypass is widely patent down to the distal anastomosis.  Following angioplasty and stent placement with a life stent at the distal anastomosis there is now wide patency of the bypass with improved outflow, there is less than 10% residual stenosis at the distal anastomosis.  Following angioplasty and stent placement of the tibioperoneal trunk and the posterior tibial artery using 3 Esprit stents there is now wide patency, less than 10% residual stenosis, of the tibioperoneal trunk and posterior tibial artery with filling to the foot.   Left Lower Extremity: Visualized portions of the right common femoral are widely  patent.    Disposition: Patient was taken to the recovery room in stable condition having tolerated the procedure well.  Joshua [name] 03/25/2024,12:28 PM

## 2024-03-26 DIAGNOSIS — I70235 Atherosclerosis of native arteries of right leg with ulceration of other part of foot: Secondary | ICD-10-CM | POA: Diagnosis not present

## 2024-03-26 LAB — CBC
HCT: 35 % — ABNORMAL LOW (ref 39.0–52.0)
Hemoglobin: 11 g/dL — ABNORMAL LOW (ref 13.0–17.0)
MCH: 26.2 pg (ref 26.0–34.0)
MCHC: 31.4 g/dL (ref 30.0–36.0)
MCV: 83.3 fL (ref 80.0–100.0)
Platelets: 193 K/uL (ref 150–400)
RBC: 4.2 MIL/uL — ABNORMAL LOW (ref 4.22–5.81)
RDW: 14.8 % (ref 11.5–15.5)
WBC: 5.5 K/uL (ref 4.0–10.5)
nRBC: 0 % (ref 0.0–0.2)

## 2024-03-26 LAB — HEPARIN LEVEL (UNFRACTIONATED): Heparin Unfractionated: 0.93 [IU]/mL — ABNORMAL HIGH (ref 0.30–0.70)

## 2024-03-26 MED ORDER — SODIUM CHLORIDE 0.9% FLUSH: 10.0000 mL | INTRAVENOUS | Status: DC | PRN

## 2024-03-26 MED ORDER — SODIUM CHLORIDE 0.9% FLUSH
10.0000 mL | Freq: Two times a day (BID) | INTRAVENOUS | Status: DC
  Administered 2024-03-26 – 2024-03-27 (×3): 10 mL

## 2024-03-26 MED ORDER — CHLORHEXIDINE GLUCONATE CLOTH 2 % EX PADS
6.0000 | MEDICATED_PAD | Freq: Every day | CUTANEOUS | Status: DC
  Administered 2024-03-26 – 2024-03-27 (×2): 6 via TOPICAL

## 2024-03-26 NOTE — Consult Note (Addendum)
 Pharmacy Consult Note - Anticoagulation  Pharmacy Consult for Heparin  Indication: Atherosclerotic occlusive disease with ulceration No Known Allergies  PATIENT MEASUREMENTS: Height: 5' 11 (180.3 cm) Weight: 85.7 kg (188 lb 15 oz) IBW/kg (Calculated) : 75.3 HEPARIN  DW (KG): 85.7  VITAL SIGNS: Temp: 98.5 F (36.9 C) (09/06 0422) BP: 119/67 (09/06 0422) Pulse Rate: 89 (09/06 0422)  Recent Labs    03/25/24 0724 03/25/24 1332 03/25/24 1607 03/25/24 1905 03/26/24 0400  HGB  --    < >  --   --  11.0*  HCT  --    < >  --   --  35.0*  PLT  --    < >  --   --  193  APTT  --   --  >200*  --   --   LABPROT  --   --  15.2  --   --   INR  --   --  1.1  --   --   HEPARINUNFRC  --   --   --    < > 0.93*  CREATININE 0.87  --   --   --   --    < > = values in this interval not displayed.    Estimated Creatinine Clearance: 90.2 mL/min (by C-G formula based on SCr of 0.87 mg/dL).  PAST MEDICAL HISTORY: Past Medical History:  Diagnosis Date   Allergy    Cancer (HCC)    lung   COPD (chronic obstructive pulmonary disease) (HCC)    Dyspnea    former smoker. 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 Swallow whole.   Past Week   clopidogrel  (PLAVIX ) 75 MG tablet TAKE 1 TABLET BY MOUTH DAILY 100 tablet 2 03/25/2024 Morning   levETIRAcetam  (KEPPRA ) 1000 MG tablet Take 1 tablet (1,000 mg total) by mouth 2 (two) times daily. 60 tablet 2 03/25/2024 Morning   levothyroxine  (SYNTHROID ) 125 MCG tablet Take 1 tablet (125 mcg total) by mouth daily before breakfast. 90 tablet 0 03/25/2024 Morning   mupirocin  ointment (BACTROBAN ) 2 % Apply 1  Application topically daily. 22 g 5 Past Week   potassium chloride  SA (KLOR-CON  M) 20 MEQ tablet Take 1 tablet (20 mEq total) by mouth daily. 30 tablet 2 03/25/2024 Morning   rosuvastatin  (CRESTOR ) 40 MG tablet Take 1 tablet (40 mg total) by mouth daily. 90 tablet 3 03/24/2024   Scheduled:   aspirin  EC  81 mg Oral Daily   clopidogrel   75 mg Oral Daily   Influenza vac split trivalent PF  0.5 mL Intramuscular Tomorrow-1000   levETIRAcetam   1,000 mg Oral BID   levothyroxine   125 mcg Oral Q0600   pneumococcal 20-valent conjugate vaccine  0.5 mL Intramuscular Tomorrow-1000   potassium chloride  SA  20 mEq Oral Daily   rosuvastatin   40 mg Oral Daily   sodium chloride  flush  3 mL Intravenous Q12H   Infusions:   sodium chloride      heparin  1,050 Units/hr (03/25/24 2047)   PRN: sodium chloride , acetaminophen , hydrALAZINE , HYDROmorphone  (DILAUDID ) injection, labetalol , morphine  injection, ondansetron  (ZOFRAN ) IV,  oxyCODONE , sodium chloride  flush  ASSESSMENT: 68 y.o. 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 Patient is not on chronic anticoagulation per chart review. Patient was previously on Eliquis  in the past, but is currently off Eliquis . Pharmacy has been consulted to initiate and manage heparin  intravenous infusion.   Goal(s) of therapy: Heparin  level 0.3 - 0.7 units/mL aPTT 66 - 102 seconds Monitor platelets by anticoagulation protocol: Yes   Baseline anticoagulation labs: Pending  Recent Labs    03/25/24 1332 03/25/24 1607 03/26/24 0400  APTT  --  >200*  --   INR  --  1.1  --   HGB 11.3*  --  11.0*  PLT 222  --  193   0905 1905 HL 0.96, SUPRAtherapeutic 0906 0400 HL 0.93, supratherapeutic    PLAN: Decrease heparin  infusion to 900 units/hour Heparin  infusion scheduled to stop at 1000 today. Monitor heparin  level and CBC daily while on heparin   infusion.  Rankin CANDIE Dills, PharmD, [facility] 03/26/2024 4:51 AM

## 2024-03-26 NOTE — Plan of Care (Signed)
  Problem: Education: Goal: Knowledge of General Education information will improve Description: Including pain rating scale, medication(s)/side effects and non-pharmacologic comfort measures 03/26/2024 2242 by Les Delon CROME, RN Outcome: Progressing 03/26/2024 2241 by Les Delon CROME, RN Outcome: Progressing

## 2024-03-26 NOTE — Plan of Care (Signed)

## 2024-03-26 NOTE — Care Management Obs Status (Signed)
 MEDICARE OBSERVATION STATUS NOTIFICATION   Patient Details  Name: Joshua [name] MRN: [id number] Date of Birth: 1955-08-24   Medicare Observation Status Notification Given:  Yes    [name] 03/26/2024, 12:43 PM

## 2024-03-26 NOTE — Progress Notes (Signed)
 [facility]  Daily Progress Note   Subjective  -   POD #2 after RLE revasc from L groin approach  Objective Vitals:   03/25/24 2129 03/26/24 0422 03/26/24 0800 03/26/24 1200  BP: 123/70 119/67 133/81 (!) 140/83  Pulse: 82 89 99 95  Resp: 17 17 18 17   Temp: 98.2 F (36.8 C) 98.5 F (36.9 C) 98.4 F (36.9 C)   TempSrc:   Oral   SpO2: 100% 98% 100% 100%  Weight:      Height:        Intake/Output Summary (Last 24 hours) at 03/26/2024 1539 Last data filed at 03/26/2024 1536 Gross per 24 hour  Intake 1527.04 ml  Output 250 ml  Net 1277.04 ml    PULM  CTAB CV  RRR VASC  Warm feet.  Staining on right PTA and L CFA access sites.  Groin dressing changed for soaking through earlier today by Nursing.  Laboratory CBC    Component Value Date/Time   WBC 5.5 03/26/2024 0400   HGB 11.0 (L) 03/26/2024 0400   HGB 10.6 (L) 04/16/2020 1525   HCT 35.0 (L) 03/26/2024 0400   HCT 34.2 (L) 04/16/2020 1525   PLT 193 03/26/2024 0400   PLT 317 04/16/2020 1525    BMET    Component Value Date/Time   NA 138 03/16/2024 0823   NA 138 06/02/2023 1049   K 3.6 03/16/2024 0823   CL 104 03/16/2024 0823   CO2 23 03/16/2024 0823   GLUCOSE 127 (H) 03/16/2024 0823   BUN 9 03/25/2024 0724   BUN 18 06/02/2023 1049   BUN 16 07/03/2014 1205   CREATININE 0.87 03/25/2024 0724   CREATININE 0.93 02/08/2024 1309   CREATININE 1.34 (H) 07/03/2014 1205   CALCIUM  9.0 03/16/2024 0823   GFRNONAA >60 03/25/2024 0724   GFRNONAA >60 02/08/2024 1309   GFRNONAA 59 (L) 07/03/2014 1205   GFRAA >60 03/30/2020 0536   GFRAA >60 07/03/2014 1205    Assessment/Planning: POD #2 s/p RLE revasc  I will watch him overnight and he can go tomorrow if no further bleeding/issues.  He and his wife are agreeable to the plan   Norleen LITTIE Pereyra  03/26/2024, 3:39 PM

## 2024-03-26 NOTE — Plan of Care (Signed)
   Problem: Education: Goal: Knowledge of General Education information will improve Description Including pain rating scale, medication(s)/side effects and non-pharmacologic comfort measures Outcome: Progressing

## 2024-03-27 DIAGNOSIS — I70235 Atherosclerosis of native arteries of right leg with ulceration of other part of foot: Secondary | ICD-10-CM | POA: Diagnosis not present

## 2024-03-27 MED ORDER — HEPARIN SOD (PORK) LOCK FLUSH 100 UNIT/ML IV SOLN
500.0000 [IU] | Freq: Once | INTRAVENOUS | Status: AC
  Administered 2024-03-27: 500 [IU] via INTRAVENOUS
  Filled 2024-03-27: qty 5

## 2024-03-27 NOTE — Plan of Care (Signed)

## 2024-03-27 NOTE — Discharge Summary (Signed)
 [facility]    Discharge Summary    Patient ID:  Joshua [name] MRN: [id number] DOB/AGE: November 14, 1955 68 y.o.  Admit date: 03/25/2024 Discharge date: 03/27/2024 Date of Surgery: 03/25/2024 Surgeon: Surgeon(s): Schnier, Cordella MATSU, MD  Admission Diagnosis: Atherosclerosis of artery of extremity with ulceration (HCC) [I70.299, L97.909]  Discharge Diagnoses:  Atherosclerosis of artery of extremity with ulceration (HCC) [I70.299, L97.909]  Secondary Diagnoses: Past Medical History:  Diagnosis Date   Allergy    Cancer (HCC)    lung   COPD (chronic obstructive pulmonary disease) (HCC)    Dyspnea    former smoker. 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  Hospital Course:  Joshua [name] is a 68 y.o. male is S/P Right lower extremity percutaneous revascularization. Procedure(s): Lower Extremity Angiography Extubated: POD # 0 Physical exam: Warm right leg and foot.  Multipl superficial ulcers but all healing. Post-op wounds healing well Pt. Ambulating, voiding and taking PO diet without difficulty. Pt pain controlled with PO pain meds. Labs as below Complications:none  Consults:    Significant Diagnostic Studies: CBC Lab Results  Component Value Date   WBC 5.5 03/26/2024   HGB 11.0 (L) 03/26/2024   HCT 35.0 (L) 03/26/2024   MCV 83.3 03/26/2024   PLT 193 03/26/2024    BMET    Component Value Date/Time   NA 138 03/16/2024 0823   NA 138 06/02/2023 1049   K 3.6 03/16/2024 0823   CL 104 03/16/2024 0823   CO2 23 03/16/2024  0823   GLUCOSE 127 (H) 03/16/2024 0823   BUN 9 03/25/2024 0724   BUN 18 06/02/2023 1049   BUN 16 07/03/2014 1205   CREATININE 0.87 03/25/2024 0724   CREATININE 0.93 02/08/2024 1309   CREATININE 1.34 (H) 07/03/2014 1205   CALCIUM  9.0 03/16/2024 0823   GFRNONAA >60 03/25/2024 0724   GFRNONAA >60 02/08/2024 1309   GFRNONAA 59 (L) 07/03/2014 1205   GFRAA >60 03/30/2020 0536   GFRAA >60 07/03/2014 1205   COAG Lab Results  Component Value Date   INR 1.1 03/25/2024   INR 1.1 12/17/2023   INR 1.0 08/20/2023     Disposition:  Discharge to :Home  Allergies as of 03/27/2024   No Known Allergies      Medication List     STOP taking these medications    mupirocin  ointment 2 % Commonly known as: BACTROBAN        TAKE these medications    aspirin  EC 81 MG tablet Take 81 mg by mouth daily. Swallow whole.   clopidogrel  75 MG tablet Commonly known as: PLAVIX  TAKE 1 TABLET BY MOUTH DAILY   levETIRAcetam  1000 MG tablet Commonly known as: KEPPRA  Take 1 tablet (1,000 mg total) by mouth 2 (two) times daily.   levothyroxine  125 MCG tablet Commonly known as: SYNTHROID  Take 1 tablet (125 mcg total) by mouth daily before breakfast.   potassium chloride  SA 20 MEQ tablet Commonly known as: KLOR-CON  M Take 1 tablet (20 mEq total) by mouth daily.   rosuvastatin  40 MG tablet  Commonly known as: CRESTOR  Take 1 tablet (40 mg total) by mouth daily.       Verbal and written Discharge instructions given to the patient. Wound care per Discharge AVS   Signed: Norleen LITTIE Pereyra, MD  03/27/2024, 2:20 PM

## 2024-03-27 NOTE — Progress Notes (Signed)
 [facility]  Daily Progress Note   Subjective  -   No bleeding from left groin or right ankle access sites.  Feels well and wants to go.  Objective Vitals:   03/26/24 1707 03/26/24 2344 03/27/24 0415 03/27/24 0934  BP: 122/74 135/81 (!) 164/82 126/71  Pulse: 86 83 98 89  Resp: 19 16 16 17   Temp: 98.2 F (36.8 C) 98 F (36.7 C) 97.9 F (36.6 C) 98.1 F (36.7 C)  TempSrc: Oral   Axillary  SpO2: 99% 100% 98% 100%  Weight:      Height:        Intake/Output Summary (Last 24 hours) at 03/27/2024 1156 Last data filed at 03/27/2024 0900 Gross per 24 hour  Intake 960 ml  Output 2950 ml  Net -1990 ml    PULM  CTAB CV  RRR VASC  Bilateral warm feet.  Ulcers on right arm clean, dry and superficial.  Access sites fine and dressings removed.  Laboratory CBC    Component Value Date/Time   WBC 5.5 03/26/2024 0400   HGB 11.0 (L) 03/26/2024 0400   HGB 10.6 (L) 04/16/2020 1525   HCT 35.0 (L) 03/26/2024 0400   HCT 34.2 (L) 04/16/2020 1525   PLT 193 03/26/2024 0400   PLT 317 04/16/2020 1525    BMET    Component Value Date/Time   NA 138 03/16/2024 0823   NA 138 06/02/2023 1049   K 3.6 03/16/2024 0823   CL 104 03/16/2024 0823   CO2 23 03/16/2024 0823   GLUCOSE 127 (H) 03/16/2024 0823   BUN 9 03/25/2024 0724   BUN 18 06/02/2023 1049   BUN 16 07/03/2014 1205   CREATININE 0.87 03/25/2024 0724   CREATININE 0.93 02/08/2024 1309   CREATININE 1.34 (H) 07/03/2014 1205   CALCIUM  9.0 03/16/2024 0823   GFRNONAA >60 03/25/2024 0724   GFRNONAA >60 02/08/2024 1309   GFRNONAA 59 (L) 07/03/2014 1205   GFRAA >60 03/30/2020 0536   GFRAA >60 07/03/2014 1205    Assessment/Planning: POD #3 s/p RLE revasc  OK for discharge.  Call office Monday for 2 week f/u appt.  Continue Home meds.  Has all meds at home.   Joshua [name]  03/27/2024, 11:56 AM

## 2024-03-28 ENCOUNTER — Inpatient Hospital Stay (HOSPITAL_BASED_OUTPATIENT_CLINIC_OR_DEPARTMENT_OTHER): Attending: Oncology | Admitting: Oncology

## 2024-03-28 ENCOUNTER — Other Ambulatory Visit: Payer: Self-pay | Admitting: Internal Medicine

## 2024-03-28 ENCOUNTER — Inpatient Hospital Stay

## 2024-03-28 ENCOUNTER — Inpatient Hospital Stay: Attending: Oncology

## 2024-03-28 ENCOUNTER — Other Ambulatory Visit: Payer: Self-pay

## 2024-03-28 ENCOUNTER — Encounter: Payer: Self-pay | Admitting: Oncology

## 2024-03-28 VITALS — BP 135/80 | HR 112 | Temp 97.4°F | Resp 18 | Wt 189.7 lb

## 2024-03-28 VITALS — BP 134/86 | HR 95

## 2024-03-28 DIAGNOSIS — Z5112 Encounter for antineoplastic immunotherapy: Secondary | ICD-10-CM

## 2024-03-28 DIAGNOSIS — R569 Unspecified convulsions: Secondary | ICD-10-CM | POA: Insufficient documentation

## 2024-03-28 DIAGNOSIS — C3491 Malignant neoplasm of unspecified part of right bronchus or lung: Secondary | ICD-10-CM

## 2024-03-28 DIAGNOSIS — E876 Hypokalemia: Secondary | ICD-10-CM | POA: Diagnosis not present

## 2024-03-28 DIAGNOSIS — C3411 Malignant neoplasm of upper lobe, right bronchus or lung: Secondary | ICD-10-CM | POA: Diagnosis not present

## 2024-03-28 DIAGNOSIS — Z79620 Long term (current) use of immunosuppressive biologic: Secondary | ICD-10-CM | POA: Insufficient documentation

## 2024-03-28 DIAGNOSIS — E032 Hypothyroidism due to medicaments and other exogenous substances: Secondary | ICD-10-CM | POA: Insufficient documentation

## 2024-03-28 DIAGNOSIS — C7931 Secondary malignant neoplasm of brain: Secondary | ICD-10-CM | POA: Diagnosis not present

## 2024-03-28 DIAGNOSIS — Z23 Encounter for immunization: Secondary | ICD-10-CM | POA: Diagnosis not present

## 2024-03-28 LAB — CBC WITH DIFFERENTIAL/PLATELET
Abs Immature Granulocytes: 0.02 K/uL (ref 0.00–0.07)
Basophils Absolute: 0.1 K/uL (ref 0.0–0.1)
Basophils Relative: 1 %
Eosinophils Absolute: 0.6 K/uL — ABNORMAL HIGH (ref 0.0–0.5)
Eosinophils Relative: 10 %
HCT: 35.9 % — ABNORMAL LOW (ref 39.0–52.0)
Hemoglobin: 11.5 g/dL — ABNORMAL LOW (ref 13.0–17.0)
Immature Granulocytes: 0 %
Lymphocytes Relative: 18 %
Lymphs Abs: 1.1 K/uL (ref 0.7–4.0)
MCH: 26.1 pg (ref 26.0–34.0)
MCHC: 32 g/dL (ref 30.0–36.0)
MCV: 81.6 fL (ref 80.0–100.0)
Monocytes Absolute: 0.7 K/uL (ref 0.1–1.0)
Monocytes Relative: 11 %
Neutro Abs: 3.6 K/uL (ref 1.7–7.7)
Neutrophils Relative %: 60 %
Platelets: 226 K/uL (ref 150–400)
RBC: 4.4 MIL/uL (ref 4.22–5.81)
RDW: 14.7 % (ref 11.5–15.5)
WBC: 6 K/uL (ref 4.0–10.5)
nRBC: 0 % (ref 0.0–0.2)

## 2024-03-28 LAB — TSH: TSH: 4.283 u[IU]/mL (ref 0.350–4.500)

## 2024-03-28 LAB — COMPREHENSIVE METABOLIC PANEL WITH GFR
ALT: 8 U/L (ref 0–44)
AST: 37 U/L (ref 15–41)
Albumin: 3.3 g/dL — ABNORMAL LOW (ref 3.5–5.0)
Alkaline Phosphatase: 74 U/L (ref 38–126)
Anion gap: 13 (ref 5–15)
BUN: 6 mg/dL — ABNORMAL LOW (ref 8–23)
CO2: 22 mmol/L (ref 22–32)
Calcium: 8.7 mg/dL — ABNORMAL LOW (ref 8.9–10.3)
Chloride: 99 mmol/L (ref 98–111)
Creatinine, Ser: 0.98 mg/dL (ref 0.61–1.24)
GFR, Estimated: >60 mL/min (ref >60)
Glucose, Bld: 163 mg/dL — ABNORMAL HIGH (ref 70–99)
Potassium: 3.5 mmol/L (ref 3.5–5.1)
Sodium: 134 mmol/L — ABNORMAL LOW (ref 135–145)
Total Bilirubin: 0.4 mg/dL (ref 0.0–1.2)
Total Protein: 6.4 g/dL — ABNORMAL LOW (ref 6.5–8.1)

## 2024-03-28 MED ORDER — SODIUM CHLORIDE 0.9 % IV SOLN
Freq: Once | INTRAVENOUS | Status: AC
  Filled 2024-03-28: qty 250

## 2024-03-28 MED ORDER — SODIUM CHLORIDE 0.9 % IV SOLN
10.0000 mg/kg | Freq: Once | INTRAVENOUS | Status: AC
  Administered 2024-03-28: 860 mg via INTRAVENOUS
  Filled 2024-03-28: qty 7.2

## 2024-03-28 MED FILL — Levetiracetam Tab 1000 MG: ORAL | 30 days supply | Qty: 60 | Fill #0 | Status: AC

## 2024-03-28 NOTE — Assessment & Plan Note (Signed)
 Follow up with [name].  Continue keppra 

## 2024-03-28 NOTE — Patient Instructions (Signed)
 [facility] - A DEPT OF [facility]  Discharge Instructions: Thank you for choosing [facility] to provide your oncology and hematology care.  If you have a lab appointment with the Cancer Center, please go directly to the Cancer Center and check in at the registration area.  Wear comfortable clothing and clothing appropriate for easy access to any Portacath or PICC line.   We strive to give you quality time with your provider. You may need to reschedule your appointment if you arrive late (15 or more minutes).  Arriving late affects you and other patients whose appointments are after yours.  Also, if you miss three or more appointments without notifying the office, you may be dismissed from the clinic at the provider's discretion.      For prescription refill requests, have your pharmacy contact our office and allow 72 hours for refills to be completed.    Today you received the following chemotherapy and/or immunotherapy agents Durvalumab      To help prevent nausea and vomiting after your treatment, we encourage you to take your nausea medication as directed.  BELOW ARE SYMPTOMS THAT SHOULD BE REPORTED IMMEDIATELY: *FEVER GREATER THAN 100.4 F (38 C) OR HIGHER *CHILLS OR SWEATING *NAUSEA AND VOMITING THAT IS NOT CONTROLLED WITH YOUR NAUSEA MEDICATION *UNUSUAL SHORTNESS OF BREATH *UNUSUAL BRUISING OR BLEEDING *URINARY PROBLEMS (pain or burning when urinating, or frequent urination) *BOWEL PROBLEMS (unusual diarrhea, constipation, pain near the anus) TENDERNESS IN MOUTH AND THROAT WITH OR WITHOUT PRESENCE OF ULCERS (sore throat, sores in mouth, or a toothache) UNUSUAL RASH, SWELLING OR PAIN  UNUSUAL VAGINAL DISCHARGE OR ITCHING   Items with * indicate a potential emergency and should be followed up as soon as possible or go to the Emergency Department if any problems should occur.  Please show the CHEMOTHERAPY ALERT CARD or IMMUNOTHERAPY  ALERT CARD at check-in to the Emergency Department and triage nurse.  Should you have questions after your visit or need to cancel or reschedule your appointment, please contact [facility] - A DEPT OF [facility]  [phone] and follow the prompts.  Office hours are 8:00 a.m. to 4:30 p.m. Monday - Friday. Please note that voicemails left after 4:00 p.m. may not be returned until the following business day.  We are closed weekends and major holidays. You have access to a nurse at all times for urgent questions. Please call the main number to the clinic [phone] and follow the prompts.  For any non-urgent questions, you may also contact your provider using MyChart. We now offer e-Visits for anyone 36 and older to request care online for non-urgent symptoms. For details visit [email or web address].   Also download the MyChart app! Go to the app store, search "MyChart", open the app, select [facility], and log in with your MyChart username and password.

## 2024-03-28 NOTE — Assessment & Plan Note (Signed)
 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 Dewey.  PET brain finding suggests recurrence -NeuroOnc recommend Radiation and Bevacizumab   Discussed with [name].    hold bevacizumab  due to recent right lower extremity percutaneous revascularization procedure. He has a repeat MRI scheduled on 04/11/2024.

## 2024-03-28 NOTE — Assessment & Plan Note (Signed)
 TSH, T4 are pending continue levothyroxine  125 mcg daily

## 2024-03-28 NOTE — Assessment & Plan Note (Signed)
Durvalumab treatment as planned.  

## 2024-03-28 NOTE — Assessment & Plan Note (Signed)
 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  Labs are reviewed and discussed with patient. Proceed with Durvalumab 

## 2024-03-28 NOTE — Progress Notes (Signed)
 Hematology/Oncology Progress note [phone] [phone]    CHIEF COMPLAINTS/REASON FOR VISIT:  Follow up for lung cancer  ASSESSMENT & PLAN:   Cancer Staging  Squamous cell carcinoma of right lung (HCC) Staging form: Lung, AJCC 8th Edition - Clinical stage from 09/20/2020: Stage IV (cT1b, cN2, cM1) - Signed by [name], MD on 12/13/2021   Squamous cell carcinoma of right lung (HCC) Durvalumab  was previously held due to pneumonitis.  Labs are reviewed and discussed with patient. Proceed with Durvalumab     Encounter for antineoplastic immunotherapy Durvalumab  treatment as planned.   Hypothyroidism due to medication TSH, T4 are pending continue levothyroxine  125 mcg daily    Malignant neoplasm metastatic to brain [facility]) S/p SRS by Radonc Dr. Dewey.  PET brain finding suggests recurrence -NeuroOnc recommend Radiation and Bevacizumab   Discussed with [name].    hold bevacizumab  due to recent right lower extremity percutaneous revascularization procedure. He has a repeat MRI scheduled on 04/11/2024.  Seizure (HCC) Follow up with [name].  Continue keppra      Follow up  2 weeks lab MD Durvalumab  Bevacizumab  All questions were answered. The patient knows to call the clinic with any problems, questions or concerns.  Call Babara, MD, PhD [facility] Hematology Oncology 03/28/2024       HISTORY OF PRESENTING ILLNESS:  Joshua [name] is a 68 y.o. male who has above history reviewed by me today presents for follow up visit for Stage IV lung squamous cell carcinoma. Oncology History  Squamous cell carcinoma of right lung (HCC)  09/18/2020 Imaging   PET scan showed right upper lobe nodule maximal SUV 11.5.  Hypermetabolic cluster right paratracheal adenopathy with maximum SUV up to 12.  No findings of metastatic disease to the neck/abdomen/pelvis or skeleton.   09/20/2020 Initial Diagnosis   Squamous cell carcinoma of right lung (HCC) NGS showed  PD-L1 TPS 95%, LRP1B S997fs, PIK3CA E545K, RB1 c1422-2A>T, RIT1 T83_ A84del, STK11 P281fs, TPS53 R248W, TMB 19.85mut/mb, MS stable.    09/20/2020 Cancer Staging   Staging form: Lung, AJCC 8th Edition - Clinical stage from 09/20/2020: Stage IV (cT1b, cN2, cM1) - Signed by [name], MD on 12/13/2021   10/03/2020 Imaging   MRI brain with and without contrast showed no evidence of intracranial metastatic disease. Well-defined round lesion within the left parotid tail measuring approximately 2.3 x 1.8 x 1.8 cm   10/09/2020 - 11/27/2020 Radiation Therapy   concurrent chemoradiation   10/10/2020 - 10/31/2020 Chemotherapy   10/10/2020 concurrent chemoradiation.  carboplatin  AUC of 2 and Taxol  45 mg/m2 x 4.  Additional chemotherapy was held due to thrombocytopenia, neutropenia       12/31/2020 - 03/21/2022 Chemotherapy   Maintenance  Durvalumab  q14d      12/31/2020 -  Chemotherapy   Patient is on Treatment Plan : LUNG Durvalumab  (10) q14d     03/28/2021 Imaging   CT without contrast showed new linear right perihilar consolidation with associated traction bronchiectasis.  Likely postradiation changes.  Additional new peribronchial vascular/nodular groundglass opacities with areas of consolidation or seen primarily in the right upper and lower lobes, more distant from expected radiation fields. 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  Per my discussion with pulmonology Dr. 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     05/09/2021 Imaging   Repeat CT chest without contrast showed evolving radiation changes in the right perihilar region likely  accounting for interval increase to thickening of the posterior wall of the right upper lobe bronchus.  The more peripheral right lung groundglass opacities seen on most recent study have generally improved with exception of one peripherally  right upper lobe which appears new.  These are likely inflammatory.  No findings highly suspicious for local recurrence.   05/30/2021 - 01/10/2022 Chemotherapy   Durvalumab  q14d     10/07/2021 Imaging   MRI brain with and without contrast showed 2 contrast-enhancing lesions, most consistent with metastatic disease.  Largest lesion is in the left frontal lobe and measures up to 1.3 cm with a large amount of surrounding edema and 9 mm rightward midline shift.  The other enhancing lesion was 4 mm in the right occipital lobe with a small amount of surrounding edema.  No acute or chronic hemorrhage.   10/07/2021 Progression   Headache after colonoscopy, progressive difficulty using her right arm.  Progressively worsening of speech. Stage IV lung cancer with brain metastasis.  started on Dexamethasone .  He was seen by neurosurgery and radiation oncology.  Recommendation is to proceed with SRS.  Patient was discharged with dexamethasone    10/16/2021 Imaging   PET scan showed postradiation changes in the right supra hilar lung without evidence of local recurrence.  No evidence of lung cancer recurrence or metastatic disease.  Single focus of intense metabolic activity associated with posterior left 11th rib likely secondary to recent fall/posttraumatic metabolic changes.  Frontal  uptake in the left frontal lobe corresponding to the known metastatic lesion in the brain   10/29/2021 -  Radiation Therapy   Brain Radiation [facility]   12/10/2021 Imaging   Brain MRI: Continued interval decrease in size of a left frontal lobe metastasis, now measuring 10 mm. Moderate surrounding edema has also decreased.  3-4 mm metastasis within the right occipital lobe, not significantly changed in size. There is now a small focus of central non enhancement within this lesion. Increased edema. 3 mm enhancing metastasis within the high left parietal lobe. In retrospect, this was likely present as a subtle punctate focus of enhancement  on the prior brain MRI of 10/22/2021.   01/18/2022 Imaging   CT chest abdomen pelvis w contrast 1. Stable post treatment changes in the right upper lobe medially with dense radiation fibrosis but no findings to suggest local recurrent tumor. 2. Significant progression of ill-defined ground-glass opacity, interstitial thickening and airspace nodularity in the right upper lobe and upper aspect of the right lower lobe. Findings could reflect an inflammatory or atypical infectious process, drug induced pneumonitis or interstitial spread of tumor.3. No mediastinal or hilar mass or adenopathy. 4. No findings for abdominal/pelvic metastatic disease or osseous metastatic disease.   02/19/2022 Imaging   MRI brain w wo contrast showed Three metastatic deposits in the brain appear smaller. Decreased edema in the left parietal lobe. No new lesions    04/04/2022 Imaging   CT chest w contrast 1. Stable radiation changes in the right perihilar region without evidence of local recurrence. 2. Interval improvement in the patchy ground-glass opacities previously demonstrated in both lungs, most consistent with a resolving infectious or inflammatory process (including immunotherapy-related pneumonitis). 3. No evidence of metastatic disease.   05/14/2022 Miscellaneous   He had a mechanical fall and fractured his left elbow. xrays showed a left elbow fracture  05/26/22 seen by orthopedic surgeon, cast applied.    05/27/2022 Imaging   MRI brain w wo contrast 1. All three known brain metastases have enlarged since  August, with increasing regional edema. And there is a small new left anterior cerebellar 4-5 mm metastasis (was questionably punctate on the May MRI). This constellation could reflect a combination of True Progression (Left Cerebellar lesion) and pseudoprogression/radionecrosis (3 existing mets).    2. No significant intracranial mass effect. 3. Underlying moderate cerebral white matter signal changes.    05/30/2022 Imaging   CT chest abdomen pelvis w contrast  1. 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 There is a stable small right pleural effusion, but no definitive findings to suggest residual or recurrent disease on today's examination. 2. No signs of metastatic disease in the abdomen or pelvis.  3. Aortic atherosclerosis, in addition to two-vessel coronary artery disease. Please note that although the presence of coronary artery calcium  documents the presence of coronary artery disease, the severity of this disease and any potential stenosis cannot be assessed on this non-gated CT examination. Assessment for potential risk factor modification, dietary therapy or pharmacologic therapy may be warranted, if clinically indicated. 4. Hepatic steatosis. 5. Colonic diverticulosis without evidence of acute diverticulitis at this time. 6. Incompletely imaged but apparently occluded vascular graft in the superior aspect of the right anterior thigh. Referral to vascular specialist for further clinical evaluation should be considered if clinically appropriate. 7. Additional incidental findings, as above.   07/22/2022 Imaging   MRI brain  1. No new or enlarging lesions. No intracranial disease progression. 2. Decreased size of treated lesions of the left frontoparietal junction and left postcentral gyrus. 3. Unchanged lesions in the left cerebellum and right occipital lobe.   10/15/2022 Imaging   MRI brain w wo contrast showed Interval increase in size of multiple intraparenchymal lesions with worsened surrounding vasogenic edema. 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 No new lesions   11/05/2022 Imaging   MRI brain w wo contrast  1. Likely slight increase and right occipital and left cerebellar  lesions. Otherwise, unchanged metastases, as above. No new lesions identified. 2. Partially imaged large probable lymph node in the left upper neck, apparent on prior PET CT.   11/13/2022 -  Radiation Therapy   Each of the sites were treated with SRS/SBRT/SRT-IMRT technique to 20 Gy in 1 fraction to each of the following sites: PTV_2_LCerebel_18mm PTV_3_RCerebel_35mm PTV_4_ROccip_108mm PTV_5_LParietal_27mm   11/27/2022 Imaging   CT chest abdomen pelvis w contrast showed  Improving bilateral scattered interstitial and ground-glass opacities. Small amount of residual areas in the extreme left lower lobe inferiorly. This specific area is slightly increased from previous but overall the amount of opacities is significantly improved. Recommend continued follow-up. No developing new mass lesion, fluid collection or lymph node enlargement. Stable posttreatment changes right perihilar in the lung. Fatty liver infiltration. Colonic diverticulosis. Improved tiny right pleural effusion   02/05/2023 Imaging   MRI brain with and without contrast Interval decrease in all of the metastatic lesions, detailed above.No new lesions identified.    Imaging   CT chest abdomen pelvis w contrast showed  1. 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 2. Trace right pleural fluid or thickening, new or minimally increased. 3. Incidental findings, including: Aortic atherosclerosis (ICD10-I70.0), coronary artery atherosclerosis and emphysema (ICD10-J43.9). Hepatic steatosis.   06/25/2023 Imaging   CT chest abdomen pelvis w contrast showed 1. Unchanged post treatment/post radiation appearance of the right lung, with perihilar and suprahilar paramedian  fibrosis and consolidation. 2. No evidence of lymphadenopathy or metastatic disease in the chest, abdomen, or pelvis. 3. Hepatic steatosis. 4. Coronary artery disease.   Aortic Atherosclerosis  (ICD10-I70.0) and Emphysema (ICD10-J43.9).   08/26/2023 [facility] Admission   hospitalization due to was observed speech and jerking movements on right side despite being on dexamethasone . Continuous EEG suggest cortical dysfunction arising from left temporal region.  No seizure or elective form discharges were seen. Patient was discharged on dexamethasone  4 mg, Keppra , lamotrigine .    11/04/2023 -  Chemotherapy   Add Bevacizumab  5mg /kg to Durvalumab .     11/17/2023 - 11/27/2023 Radiation Therapy   Patient received radiation to brain for treatment of recurrence.   02/23/2024 Imaging   CT chest abdomen pelvis w contrast showed  CHEST:   1. Post RIGHT upper lobe resection. 2. No evidence of lung cancer recurrence. 3. No mediastinal adenopathy.   PELVIS:   1. No evidence of metastatic disease in the abdomen pelvis. 2. No skeletal metastasis.     INTERVAL HISTORY Joshua [name] is a 68 y.o. male who has above history reviewed by me today presents for follow up visit for right lung RCC.  chronic SOB,not worse.  Right lower extremity wound- he follows up with wound care Status post right lower extremity percutaneous revascularization.  Patient is on aspirin  and Plavix . Nausea has improved.    Review of Systems  Constitutional:  Positive for fatigue. Negative for appetite change, chills, fever and unexpected weight change.  HENT:   Negative for hearing loss and voice change.   Eyes:  Negative for eye problems and icterus.  Respiratory:  Negative for chest tightness, cough and shortness of breath.   Cardiovascular:  Positive for leg swelling. Negative for chest pain.  Gastrointestinal:  Negative for abdominal distention, abdominal pain, nausea and vomiting.  Endocrine: Negative for hot flashes.  Genitourinary:  Negative for difficulty urinating, dysuria and frequency.   Musculoskeletal:  Positive for gait problem. Negative for arthralgias.       Right big toe wound  Skin:   Negative for itching and rash.  Neurological:  Positive for gait problem. Negative for extremity weakness, headaches, numbness and speech difficulty.  Hematological:  Negative for adenopathy. Bruises/bleeds easily.  Psychiatric/Behavioral:  Negative for confusion.       MEDICAL HISTORY:  Past Medical History:  Diagnosis Date   Allergy    Cancer (HCC)    lung   COPD (chronic obstructive pulmonary disease) (HCC)    Dyspnea    former smoker. 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   Malignant neoplasm metastatic to brain [facility])    Nausea with vomiting 02/01/2024   Peripheral vascular disease (HCC)    Pre-diabetes    Sleep apnea     SURGICAL HISTORY: Past Surgical History:  Procedure Laterality Date   ANTERIOR CERVICAL DECOMP/DISCECTOMY FUSION N/A 03/14/2020   Procedure: ANTERIOR CERVICAL DECOMPRESSION/DISCECTOMY FUSION 3 LEVELS C4-7;  Surgeon: Clois Fret, MD;  Location: [facility] ORS;  Service: Neurosurgery;  Laterality: N/A;   BACK SURGERY  2011   CENTRAL LINE INSERTION Right 09/10/2016   Procedure: CENTRAL LINE INSERTION;  Surgeon: Jama Cordella MATSU, MD;  Location: [facility] ORS;  Service: Vascular;  Laterality: Right;   CERVICAL FUSION     C4-c7 on Mar 14, 2020   COLONOSCOPY  2013 ?   COLONOSCOPY N/A 09/09/2021   Procedure: COLONOSCOPY;  Surgeon: Onita Elspeth Sharper, DO;  Location: [facility] ENDOSCOPY;  Service: Gastroenterology;  Laterality: N/A;   CORONARY ANGIOPLASTY     ESOPHAGOGASTRODUODENOSCOPY N/A 09/09/2021   Procedure: ESOPHAGOGASTRODUODENOSCOPY (EGD);  Surgeon: Onita Elspeth Sharper, DO;  Location: [facility] ENDOSCOPY;  Service: Gastroenterology;  Laterality: N/A;   FEMORAL-POPLITEAL BYPASS GRAFT Right 09/10/2016   Procedure: BYPASS GRAFT FEMORAL-POPLITEAL ARTERY ( BELOW KNEE );  Surgeon: Jama Cordella MATSU, MD;  Location: [facility] ORS;  Service: Vascular;  Laterality: Right;   FEMORAL-TIBIAL BYPASS GRAFT Right 03/23/2020    Procedure: BYPASS GRAFT RIGHT FEMORAL- DISTAL TIBIAL ARTERY WITH DISTAL FLOW GRAFT;  Surgeon: Jama Cordella MATSU, MD;  Location: [facility] ORS;  Service: Vascular;  Laterality: Right;   LEFT HEART CATH AND CORONARY ANGIOGRAPHY Left 05/16/2021   Procedure: LEFT HEART CATH AND CORONARY ANGIOGRAPHY;  Surgeon: Lawyer Bernardino Cough, MD;  Location: [facility] INVASIVE CV LAB;  Service: Cardiovascular;  Laterality: Left;   LOWER EXTREMITY ANGIOGRAPHY Right 11/18/2016   Procedure: Lower Extremity Angiography;  Surgeon: Cordella MATSU Jama, MD;  Location: [facility] INVASIVE CV LAB;  Service: Cardiovascular;  Laterality: Right;   LOWER EXTREMITY ANGIOGRAPHY Right 12/09/2016   Procedure: Lower Extremity Angiography;  Surgeon: Jama Cordella MATSU, MD;  Location: [facility] INVASIVE CV LAB;  Service: Cardiovascular;  Laterality: Right;   LOWER EXTREMITY ANGIOGRAPHY Right 11/15/2019   Procedure: LOWER EXTREMITY ANGIOGRAPHY;  Surgeon: Jama Cordella MATSU, MD;  Location: [facility] INVASIVE CV LAB;  Service: Cardiovascular;  Laterality: Right;   LOWER EXTREMITY ANGIOGRAPHY Right 12/01/2019   Procedure: Lower Extremity Angiography;  Surgeon: Marea Selinda RAMAN, MD;  Location: [facility] INVASIVE CV LAB;  Service: Cardiovascular;  Laterality: Right;   LOWER EXTREMITY ANGIOGRAPHY Right 12/02/2019   Procedure: LOWER EXTREMITY ANGIOGRAPHY;  Surgeon: Jama Cordella MATSU, MD;  Location: [facility] IN[facility] LAB;  Service: Cardiovascular;  Laterality: Right;   LOWER EXTREMITY ANGIOGRAPHY Right 03/23/2020   Procedure: Lower Extremity Angiography;  Surgeon: Jama Cordella MATSU, MD;  Location: [facility] INV[facility] LAB;  Service: Cardiovascular;  Laterality: Right;   LOWER EXTREMITY ANGIOGRAPHY Right 09/25/2021   Procedure: Lower Extremity Angiography;  Surgeon: Jama Cordella MATSU, MD;  Location: [facility] INV[facility] CV LAB;  Service: Cardiovascular;  Laterality: Right;   LOWER EXTREMITY ANGIOGRAPHY Right 09/26/2021   Procedure: Lower Extremity Angiography;  Surgeon: Marea Selinda RAMAN, MD;  Location:  [facility] INV[facility]IVE CV LAB;  Service: Cardiovascular;  Laterality: Right;   LOWER EXTREMITY ANGIOGRAPHY Right 04/08/2022   Procedure: Lower Extremity Angiography;  Surgeon: Jama Cordella MATSU, MD;  Location: [facility] INV[facility] LAB;  Service: Cardiovascular;  Laterality: Right;   LOWER EXTREMITY ANGIOGRAPHY Right 03/04/2024   Procedure: Lower Extremity Angiography;  Surgeon: Jama Cordella MATSU, MD;  Location: [facility] IN[facility];  Service: Cardiovascular;  Laterality: Right;   LOWER EXTREMITY ANGIOGRAPHY Right 03/25/2024   Procedure: Lower Extremity Angiography;  Surgeon: Jama Cordella MATSU, MD;  Location: [facility];  Service: Cardiovascular;  Laterality: Right;   LOWER EXTREMITY INTERVENTION  11/18/2016   Procedure: Lower Extremity Intervention;  Surgeon: Cordella MATSU Jama, MD;  Location: [facility];  Service: Cardiovascular;;   LOWER EXTREMITY INTERVENTION Right 03/04/2024   Procedure: LOWER EXTREMITY INTERVENTION;  Surgeon: Jama Cordella MATSU, MD;  Location: [facility];  Service: Cardiovascular;  Laterality: Right;   PERIPHERAL VASCULAR CATHETERIZATION Right 01/02/2015   Procedure: Lower Extremity Angiography;  Surgeon: Cordella MATSU Jama, MD;  Location: [facility];  Service: Cardiovascular;  Laterality: Right;   PERIPHERAL VASCULAR CATHETERIZATION Right 06/18/2015   Procedure: Lower Extremity Angiography;  Surgeon: Selinda RAMAN Marea,  MD;  Location: [facility];  Service: Cardiovascular;  Laterality: Right;   PERIPHERAL VASCULAR CATHETERIZATION  06/18/2015   Procedure: Lower Extremity Intervention;  Surgeon: Selinda GORMAN Gu, MD;  Location: [facility] LAB;  Service: Cardiovascular;;   PERIPHERAL VASCULAR CATHETERIZATION N/A 10/09/2015   Procedure: Abdominal Aortogram w/Lower Extremity;  Surgeon: Cordella KANDICE Shawl, MD;  Location: [facility];  Service: Cardiovascular;  Laterality: N/A;   PERIPHERAL VASCULAR CATHETERIZATION  10/09/2015   Procedure: Lower  Extremity Intervention;  Surgeon: Cordella KANDICE Shawl, MD;  Location: [facility];  Service: Cardiovascular;;   PERIPHERAL VASCULAR CATHETERIZATION Right 10/10/2015   Procedure: Lower Extremity Angiography;  Surgeon: Selinda GORMAN Gu, MD;  Location: [facility];  Service: Cardiovascular;  Laterality: Right;   PERIPHERAL VASCULAR CATHETERIZATION Right 10/10/2015   Procedure: Lower Extremity Intervention;  Surgeon: Selinda GORMAN Gu, MD;  Location: [facility];  Service: Cardiovascular;  Laterality: Right;   PERIPHERAL VASCULAR CATHETERIZATION Right 12/03/2015   Procedure: Lower Extremity Angiography;  Surgeon: Selinda GORMAN Gu, MD;  Location: [facility];  Service: Cardiovascular;  Laterality: Right;   PERIPHERAL VASCULAR CATHETERIZATION Right 12/04/2015   Procedure: Lower Extremity Angiography;  Surgeon: Selinda GORMAN Gu, MD;  Location: [facility];  Service: Cardiovascular;  Laterality: Right;   PERIPHERAL VASCULAR CATHETERIZATION  12/04/2015   Procedure: Lower Extremity Intervention;  Surgeon: Selinda GORMAN Gu, MD;  Location: [facility];  Service: Cardiovascular;;   PERIPHERAL VASCULAR CATHETERIZATION Right 06/30/2016   Procedure: Lower Extremity Angiography;  Surgeon: Selinda GORMAN Gu, MD;  Location: [facility];  Service: Cardiovascular;  Laterality: Right;   PERIPHERAL VASCULAR CATHETERIZATION Right 06/25/2016   Procedure: Lower Extremity Angiography;  Surgeon: Cordella KANDICE Shawl, MD;  Location: [facility];  Service: Cardiovascular;  Laterality: Right;   PERIPHERAL VASCULAR CATHETERIZATION Right 07/01/2016   Procedure: Lower Extremity Angiography;  Surgeon: Cordella KANDICE Shawl, MD;  Location: [facility];  Service: Cardiovascular;  Laterality: Right;   PERIPHERAL VASCULAR CATHETERIZATION  07/01/2016   Procedure: Lower Extremity Intervention;  Surgeon: Cordella KANDICE Shawl, MD;  Location: [facility];  Service: Cardiovascular;;   PERIPHERAL  VASCULAR CATHETERIZATION Right 08/01/2016   Procedure: Lower Extremity Angiography;  Surgeon: Cordella KANDICE Shawl, MD;  Location: [facility];  Service: Cardiovascular;  Laterality: Right;   PORTA CATH INSERTION N/A 10/05/2020   Procedure: PORTA CATH INSERTION;  Surgeon: Shawl Cordella KANDICE, MD;  Location: [facility] IN[facility] LAB;  Service: Cardiovascular;  Laterality: N/A;   stent placement in right leg Right    VIDEO BRONCHOSCOPY N/A 09/14/2020   Procedure: VIDEO BRONCHOSCOPY WITH FLUORO;  Surgeon: Tamea Dedra CROME, MD;  Location: [facility];  Service: Cardiopulmonary;  Laterality: N/A;   VIDEO BRONCHOSCOPY WITH ENDOBRONCHIAL ULTRASOUND N/A 09/14/2020   Procedure: VIDEO BRONCHOSCOPY WITH ENDOBRONCHIAL ULTRASOUND;  Surgeon: Tamea Dedra CROME, MD;  Location: [facility];  Service: Cardiopulmonary;  Laterality: N/A;    SOCIAL HISTORY: Social History   Socioeconomic History   Marital status: Single    Spouse name: Not on file   Number of children: Not on file   Years of education: Not on file   Highest education level: Not on file  Occupational History   Occupation: unemployed  Tobacco Use   Smoking status: Former    Current packs/day: 0.00    Average packs/day: 2.0 packs/day for 38.0 years (76.0 ttl pk-yrs)    Types: Cigarettes    Start  date: 12/19/1976    Quit date: 12/20/2014    Years since quitting: 9.2   Smokeless tobacco: Former   Tobacco comments:    quit smoking in 2017 most smoked 2   Vaping Use   Vaping status: Never Used  Substance and Sexual Activity   Alcohol  use: Not Currently   Drug use: No   Sexual activity: Yes  Other Topics Concern   Not on file  Social History Narrative   Not on file   Social Drivers of Health   Financial Resource Strain: Medium Risk (02/05/2024)   Overall Financial Resource Strain (CARDIA)    Difficulty of Paying Living Expenses: Somewhat hard  Food Insecurity: No Food Insecurity (03/25/2024)   Hunger Vital Sign    Worried About Running Out  of Food in the Last Year: Never true    Ran Out of Food in the Last Year: Never true  Transportation Needs: No Transportation Needs (03/25/2024)   PRAPARE - Administrator, Civil Service (Medical): No    Lack of Transportation (Non-Medical): No  Physical Activity: Inactive (02/05/2024)   Exercise Vital Sign    Days of Exercise per Week: 1 day    Minutes of Exercise per Session: 0 min  Stress: No Stress Concern Present (02/05/2024)   [employer] of Occupational Health - Occupational Stress Questionnaire    Feeling of Stress: Only a little  Social Connections: Socially Isolated (03/25/2024)   Social Connection and Isolation Panel    Frequency of Communication with Friends and Family: Three times a week    Frequency of Social Gatherings with Friends and Family: Once a week    Attends Religious Services: Never    Database administrator or Organizations: No    Attends Banker Meetings: Never    Marital Status: Never married  Intimate Partner Violence: Not At Risk (03/25/2024)   Humiliation, Afraid, Rape, and Kick questionnaire    Fear of Current or Ex-Partner: No    Emotionally Abused: No    Physically Abused: No    Sexually Abused: No    FAMILY HISTORY: Family History  Problem Relation Age of Onset   Diabetes Mother    Hypertension Mother    Heart murmur Mother    Leukemia Mother    Throat cancer Maternal Grandmother    Colon cancer Neg Hx    Breast cancer Neg Hx     ALLERGIES:  has no known allergies.  MEDICATIONS:  Current Outpatient Medications  Medication Sig Dispense Refill   aspirin  EC 81 MG tablet Take 81 mg by mouth daily. Swallow whole.     clopidogrel  (PLAVIX ) 75 MG tablet TAKE 1 TABLET BY MOUTH DAILY 100 tablet 2   levETIRAcetam  (KEPPRA ) 1000 MG tablet Take 1 tablet (1,000 mg total) by mouth 2 (two) times daily. 60 tablet 2   levothyroxine  (SYNTHROID ) 125 MCG tablet Take 1 tablet (125 mcg total) by mouth daily before breakfast. 90 tablet 0    potassium chloride  SA (KLOR-CON  M) 20 MEQ tablet Take 1 tablet (20 mEq total) by mouth daily. 30 tablet 2   rosuvastatin  (CRESTOR ) 40 MG tablet Take 1 tablet (40 mg total) by mouth daily. 90 tablet 3   No current facility-administered medications for this visit.   Facility-Administered Medications Ordered in Other Visits  Medication Dose Route Frequency Provider Last Rate Last Admin   albuterol  (PROVENTIL ) (2.5 MG/3ML) 0.083% nebulizer solution 2.5 mg  2.5 mg Nebulization Once Tamea Dedra CROME, MD  heparin  lock flush 100 unit/mL  500 Units Intracatheter Once PRN [name], MD         PHYSICAL EXAMINATION: ECOG PERFORMANCE STATUS: 1 - Symptomatic but completely ambulatory Vitals:   03/28/24 1310  BP: 135/80  Pulse: (!) 112  Resp: 18  Temp: (!) 97.4 F (36.3 C)  SpO2: 98%    Filed Weights   03/28/24 1310  Weight: 189 lb 11.2 oz (86 kg)     Physical Exam Constitutional:      General: He is not in acute distress.    Appearance: He is obese.  HENT:     Head: Normocephalic and atraumatic.  Eyes:     General: No scleral icterus. Cardiovascular:     Rate and Rhythm: Normal rate and regular rhythm.  Pulmonary:     Effort: Pulmonary effort is normal. No respiratory distress.     Breath sounds: Normal breath sounds. No wheezing.  Abdominal:     General: There is no distension.     Palpations: Abdomen is soft.     Tenderness: There is no abdominal tenderness.  Musculoskeletal:     Cervical back: Normal range of motion and neck supple.     Comments: Right lower extremity wrapped.   Skin:    General: Skin is warm and dry.     Findings: Bruising present. No erythema.  Neurological:     Mental Status: He is alert and oriented to person, place, and time. Mental status is at baseline.     Cranial Nerves: No cranial nerve deficit.     Motor: No abnormal muscle tone.  Psychiatric:        Mood and Affect: Mood and affect normal.         LABORATORY DATA:  I have  reviewed the data as listed    Latest Ref Rng & Units 03/28/2024   12:54 PM 03/26/2024    4:00 AM 03/25/2024    1:32 PM  CBC  WBC 4.0 - 10.5 K/uL 6.0  5.5  5.7   Hemoglobin 13.0 - 17.0 g/dL 88.4  88.9  88.6   Hematocrit 39.0 - 52.0 % 35.9  35.0  36.1   Platelets 150 - 400 K/uL 226  193  222       Latest Ref Rng & Units 03/28/2024   12:54 PM 03/25/2024    7:24 AM 03/16/2024    8:23 AM  CMP  Glucose 70 - 99 mg/dL 836   872   BUN 8 - 23 mg/dL 6  9  <5   Creatinine 9.38 - 1.24 mg/dL 9.01  9.12  9.12   Sodium 135 - 145 mmol/L 134   138   Potassium 3.5 - 5.1 mmol/L 3.5   3.6   Chloride 98 - 111 mmol/L 99   104   CO2 22 - 32 mmol/L 22   23   Calcium  8.9 - 10.3 mg/dL 8.7   9.0   Total Protein 6.5 - 8.1 g/dL 6.4   6.5   Total Bilirubin 0.0 - 1.2 mg/dL 0.4   0.6   Alkaline Phos 38 - 126 U/L 74   82   AST 15 - 41 U/L 37   29   ALT 0 - 44 U/L 8   9      RADIOGRAPHIC STUDIES: I have personally reviewed the radiological images as listed and agreed with the findings in the report. PERIPHERAL VASCULAR CATHETERIZATION Result Date: 03/25/2024 See surgical note for result.  PERIPHERAL VASCULAR CATHETERIZATION  Result Date: 03/04/2024 See surgical note for result.  CT CHEST ABDOMEN PELVIS W CONTRAST Result Date: 03/01/2024 CLINICAL DATA:  Lung cancer. Assess treatment response. * Tracking Code: BO * EXAM: CT CHEST, ABDOMEN, AND PELVIS WITH CONTRAST TECHNIQUE: Multidetector CT imaging of the chest, abdomen and pelvis was performed following the standard protocol during bolus administration of intravenous contrast. RADIATION DOSE REDUCTION: This exam was performed according to the departmental dose-optimization program which includes automated exposure control, adjustment of the mA and/or kV according to patient size and/or use of iterative reconstruction technique. CONTRAST:  OMNIPAQUE  IOHEXOL  300 MG/ML  SOLN COMPARISON:  CT 526 1,025 com PET-CT 2000 FINDINGS: CT CHEST FINDINGS Cardiovascular: Port in  the anterior chest wall with tip in distal SVC. No significant vascular findings. Normal heart size. No pericardial effusion. Mediastinum/Nodes: No axillary or supraclavicular adenopathy. No mediastinal or hilar adenopathy. No pericardial fluid. Esophagus normal. Lungs/Pleura: Post RIGHT upper lobe resection. RIGHT suprahilar peribronchial thickening unchanged from prior. No new nodularity the RIGHT lung. LEFT lung clear. Musculoskeletal: No aggressive osseous lesion. CT ABDOMEN AND PELVIS FINDINGS Hepatobiliary: No focal hepatic lesion. No biliary ductal dilatation. Gallbladder is normal. Common bile duct is normal. Pancreas: Pancreas is normal. No ductal dilatation. No pancreatic inflammation. Spleen: Normal spleen Adrenals/urinary tract: Adrenal glands and kidneys are normal. The ureters and bladder normal. Stomach/Bowel: Stomach, small bowel, appendix, and cecum are normal. Multiple diverticula of the descending colon and sigmoid colon without acute inflammation. Vascular/Lymphatic: Abdominal aorta is normal caliber. There is no retroperitoneal or periportal lymphadenopathy. No pelvic lymphadenopathy. Reproductive: Unremarkable Other: No free fluid. Musculoskeletal: No aggressive osseous lesion. IMPRESSION: CHEST: 1. Post RIGHT upper lobe resection. 2. No evidence of lung cancer recurrence. 3. No mediastinal adenopathy. PELVIS: 1. No evidence of metastatic disease in the abdomen pelvis. 2. No skeletal metastasis. Electronically Signed   By: Jackquline Boxer M.D.   On: 03/01/2024 16:37   VAS US  ABI WITH/WO TBI Result Date: 02/29/2024  LOWER EXTREMITY DOPPLER STUDY Patient Name:  Joshua [name]  Date of Exam:   02/25/2024 Medical Rec #: [id number]            Accession #:    [id number] Date of Birth: 28-Jun-1956           Patient Gender: M Patient Age:   73 years Exam Location:  [facility] Procedure:      VAS US  ABI WITH/WO TBI Referring Phys:  --------------------------------------------------------------------------------  Indications: Ulceration, peripheral artery disease, and Routine f/u. Patient              reports no claudication or lower extremity pain. High Risk Factors: Hyperlipidemia, past history of smoking. Other Factors: ASO s/p multiple interventions and bypass.                11/15/2019 Right thrombectomy of Fempop insitu graft with added                stent throughout.                 12/01/2019: Aortogram and Selective Right Lower Extremity                Angiogram. 6 mg of TPA delivered in a lysis catheter the Origin                of the Right Femoral -Popliteal bypass down the proximal ATA with                placement of  the lysis.                 12/02/2019: PTA of the Right Posterior Tibial Artery. PTA of the                Right Peroneal Artery. Mechanical Thrombectomy of the Right                Femoral -Popliteal Bypass graft. Mechanical Thrombectomy of the                Right Tibioperoneal Trunk and Posterior Tibial Artery.                 09/25/2021: Right Lower Extremity Angiography third order                catheter placement. PTA of the proximal peroneal and                tibioperoneal trunk to 2.5 mm with an ultra score balloon. PTA of                the Femoral Artery and the proximal anastomosis of the bypass                graft to 4 mm with an ultra score balloon. Administration of 5 mg                of tpa throughout the entire bypass and in the peroneal Artery.                Initiation of continuous tpa infusion Right lower Extremity for                limb salvage. Insertion of a triple lumen catheter Left CFV with                US  and fluoroscopic guidance.                 09/26/2021: Right LE Angiogram. PTA of the Right Posterior Tibial                Artery with 2.5 mm diameter by 30 cm length angioplasty balloon.                PTA of the distal bypass anastomosis and distal bypass graft                 analogous to the Popliteal artery with 4 mm diameter by 8 cm                length Lutonix drug coated angioplasty balloon.                04/08/2022 right thrombectomy of the Deep profunda.  Vascular Interventions: Hx of multiple right lower extremity PTA's/stents.                         09/10/2016 Right femoral to below the knee popliteal                         artery bypass graft placement. 5/01 Right bypass graft &                         mid popliteal artery PTA & coil embolectomy of large                         branch of  bypass graft. 12/09/16 Right distal bypass                         graft PTA/stent and right posterior tibial artery PTA.                         03/23/2020 Rt PTA thrombectomy. Rt Profunda fem artery to                         PTA bypass. Performing Technologist: Donnice Charnley RVT  Examination Guidelines: A complete evaluation includes at minimum, Doppler waveform signals and systolic blood pressure reading at the level of bilateral brachial, anterior tibial, and posterior tibial arteries, when vessel segments are accessible. Bilateral testing is considered an integral part of a complete examination. Photoelectric Plethysmograph (PPG) waveforms and toe systolic pressure readings are included as required and additional duplex testing as needed. Limited examinations for reoccurring indications may be performed as noted.  ABI Findings: +---------+------------------+-----+----------+--------+ Right    Rt Pressure (mmHg)IndexWaveform  Comment  +---------+------------------+-----+----------+--------+ Brachial 167                                       +---------+------------------+-----+----------+--------+ PTA      61                0.37 monophasic         +---------+------------------+-----+----------+--------+ DP       54                0.32 monophasic         +---------+------------------+-----+----------+--------+ Great Toe43                0.26                     +---------+------------------+-----+----------+--------+ +---------+------------------+-----+---------+-------+ Left     Lt Pressure (mmHg)IndexWaveform Comment +---------+------------------+-----+---------+-------+ Brachial 164                                     +---------+------------------+-----+---------+-------+ PTA      165               0.99 biphasic         +---------+------------------+-----+---------+-------+ DP       189               1.13 triphasic        +---------+------------------+-----+---------+-------+ Great Toe125               0.75                  +---------+------------------+-----+---------+-------+ +-------+-----------+-----------+------------+------------+ ABI/TBIToday's ABIToday's TBIPrevious ABIPrevious TBI +-------+-----------+-----------+------------+------------+ Right  0.37       0.26       0.44        0.24         +-------+-----------+-----------+------------+------------+ Left   1.13       0.75       1.13        0.61         +-------+-----------+-----------+------------+------------+ Bilateral ABIs appear essentially unchanged compared to prior study on 07/09/2023.  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 The right toe-brachial index is abnormal. Left: Resting left ankle-brachial index is  within normal range. The left toe-brachial index is normal. *See table(s) above for measurements and observations.  Electronically signed by [name] MD on 02/29/2024 at 7:39:27 AM.    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 EXAM: MRI HEAD WITHOUT AND WITH CONTRAST TECHNIQUE: Multiplanar, multiecho pulse sequences of the brain and surrounding structures were obtained without and with intravenous contrast. CONTRAST:  9mL GADAVIST  GADOBUTROL  1 MMOL/ML IV SOLN COMPARISON:   MRI of the brain dated November 06, 2023. FINDINGS: Brain: A centrally necrotic, peripherally enhancing lesion within the left parietal lobe has decreased in size in the interim from approximately 3.5 x 2.9 x 2.4 cm to approximately 2.7 x 2.0 x 1.8 cm. A lesion within the left frontal operculum as marginally decreased in size in the interim from 1.4 x 1.4 x 1.6 cm to approximately 1.2 x 1.2 x 1.3 cm. A and nodule previously noted within the right occipital lobe has also marginally in decrease in size in the interim from 8 mm in diameter to 7 mm. The linear area of enhancement previously noted along the left inferior parietal lobe appears represent a vessel. There are no new or enlarging lesions present. There is moderate periventricular and deep cerebral white matter disease. There is gliosis also noted adjacent to the necrotic lesion within the left parietal lobe. Vascular: Normal vascular flow voids. Skull and upper cervical spine: No osseous lesions are evident. Normal bone marrow signal. Sinuses/Orbits: Mild mucosal disease within the right maxillary sinus. The orbits are unremarkable. Other: None. IMPRESSION: 1. Interval improvement of metastatic disease within the left parietal, left frontal and right occipital lobes. Electronically Signed   By: Evalene Coho M.D.   On: 01/04/2024 13:17

## 2024-03-29 ENCOUNTER — Other Ambulatory Visit: Payer: Self-pay

## 2024-03-29 LAB — T4: T4, Total: 11.7 ug/dL (ref 4.5–12.0)

## 2024-03-31 ENCOUNTER — Inpatient Hospital Stay

## 2024-03-31 NOTE — Progress Notes (Signed)
 Nutrition Follow-up:  Patient with stage IV lung SCC.  Patient receiving durvalumab .  Noted admission for lower extremity revascularization procedure.    Spoke with patient via phone.  Reports that his appetite is up and down.  Has not eaten anything today as just got up.  Says that in the hospital he ate some cheerios and strawberries one morning and took few bites of cold burger.  Said one morning during admission throw up his breakfast.  Has not taken nausea medication.  Has not tried oral nutrition supplement samples RN gave him at last visit.    Medications: reviewed  Labs: reviewed  Anthropometrics:   Weight 189 lb 11.2 oz on 9/8 189 lb on 8/28 222 lb 3.2 oz on 5/14   NUTRITION DIAGNOSIS: Inadequate oral intake continues    INTERVENTION:  Continue to encourage trying nausea medication Encouraged patient to try oral nutrition supplement shake Consider trial of appetite stimulant     MONITORING, EVALUATION, GOAL: weight trends, intake   NEXT VISIT: Tuesday, Sept 23 during infusion  Aydian Dimmick B. Dasie SOLON, CSO, LDN Registered Dietitian [phone]

## 2024-04-07 ENCOUNTER — Encounter: Payer: Self-pay | Admitting: Oncology

## 2024-04-11 ENCOUNTER — Ambulatory Visit
Admission: RE | Admit: 2024-04-11 | Discharge: 2024-04-11 | Disposition: A | Discharge status: Home / Self Care | Source: Ambulatory Visit | Attending: Internal Medicine | Admitting: Internal Medicine

## 2024-04-11 ENCOUNTER — Encounter: Payer: Self-pay | Admitting: Vascular Surgery

## 2024-04-11 ENCOUNTER — Other Ambulatory Visit

## 2024-04-11 DIAGNOSIS — C7931 Secondary malignant neoplasm of brain: Secondary | ICD-10-CM

## 2024-04-11 DIAGNOSIS — C3490 Malignant neoplasm of unspecified part of unspecified bronchus or lung: Secondary | ICD-10-CM | POA: Diagnosis not present

## 2024-04-11 MED ORDER — GADOPICLENOL 0.5 MMOL/ML IV SOLN
9.0000 mL | Freq: Once | INTRAVENOUS | Status: AC | PRN
  Administered 2024-04-11: 9 mL via INTRAVENOUS

## 2024-04-12 ENCOUNTER — Inpatient Hospital Stay (HOSPITAL_BASED_OUTPATIENT_CLINIC_OR_DEPARTMENT_OTHER): Admitting: Oncology

## 2024-04-12 ENCOUNTER — Encounter: Payer: Self-pay | Admitting: Oncology

## 2024-04-12 ENCOUNTER — Inpatient Hospital Stay

## 2024-04-12 ENCOUNTER — Other Ambulatory Visit: Payer: Self-pay

## 2024-04-12 VITALS — BP 122/82 | HR 100 | Temp 97.6°F | Resp 20 | Wt 184.6 lb

## 2024-04-12 VITALS — BP 134/75 | HR 83 | Temp 96.1°F | Resp 18

## 2024-04-12 DIAGNOSIS — C3491 Malignant neoplasm of unspecified part of right bronchus or lung: Secondary | ICD-10-CM

## 2024-04-12 DIAGNOSIS — E876 Hypokalemia: Secondary | ICD-10-CM

## 2024-04-12 DIAGNOSIS — R112 Nausea with vomiting, unspecified: Secondary | ICD-10-CM

## 2024-04-12 DIAGNOSIS — I739 Peripheral vascular disease, unspecified: Secondary | ICD-10-CM | POA: Diagnosis not present

## 2024-04-12 DIAGNOSIS — Z5112 Encounter for antineoplastic immunotherapy: Secondary | ICD-10-CM

## 2024-04-12 DIAGNOSIS — Z23 Encounter for immunization: Secondary | ICD-10-CM

## 2024-04-12 DIAGNOSIS — C7931 Secondary malignant neoplasm of brain: Secondary | ICD-10-CM | POA: Diagnosis not present

## 2024-04-12 DIAGNOSIS — R569 Unspecified convulsions: Secondary | ICD-10-CM

## 2024-04-12 DIAGNOSIS — E032 Hypothyroidism due to medicaments and other exogenous substances: Secondary | ICD-10-CM

## 2024-04-12 LAB — CBC WITH DIFFERENTIAL/PLATELET
Abs Immature Granulocytes: 0.01 K/uL (ref 0.00–0.07)
Basophils Absolute: 0.1 K/uL (ref 0.0–0.1)
Basophils Relative: 1 %
Eosinophils Absolute: 0.3 K/uL (ref 0.0–0.5)
Eosinophils Relative: 6 %
HCT: 35.2 % — ABNORMAL LOW (ref 39.0–52.0)
Hemoglobin: 11.1 g/dL — ABNORMAL LOW (ref 13.0–17.0)
Immature Granulocytes: 0 %
Lymphocytes Relative: 23 %
Lymphs Abs: 1 K/uL (ref 0.7–4.0)
MCH: 25.2 pg — ABNORMAL LOW (ref 26.0–34.0)
MCHC: 31.5 g/dL (ref 30.0–36.0)
MCV: 80 fL (ref 80.0–100.0)
Monocytes Absolute: 0.4 K/uL (ref 0.1–1.0)
Monocytes Relative: 9 %
Neutro Abs: 2.8 K/uL (ref 1.7–7.7)
Neutrophils Relative %: 61 %
Platelets: 285 K/uL (ref 150–400)
RBC: 4.4 MIL/uL (ref 4.22–5.81)
RDW: 15.2 % (ref 11.5–15.5)
WBC: 4.6 K/uL (ref 4.0–10.5)
nRBC: 0 % (ref 0.0–0.2)

## 2024-04-12 LAB — COMPREHENSIVE METABOLIC PANEL WITH GFR
ALT: 8 U/L (ref 0–44)
AST: 32 U/L (ref 15–41)
Albumin: 3.3 g/dL — ABNORMAL LOW (ref 3.5–5.0)
Alkaline Phosphatase: 75 U/L (ref 38–126)
Anion gap: 12 (ref 5–15)
BUN: 8 mg/dL (ref 8–23)
CO2: 21 mmol/L — ABNORMAL LOW (ref 22–32)
Calcium: 8.7 mg/dL — ABNORMAL LOW (ref 8.9–10.3)
Chloride: 106 mmol/L (ref 98–111)
Creatinine, Ser: 1.08 mg/dL (ref 0.61–1.24)
GFR, Estimated: >60 mL/min (ref >60)
Glucose, Bld: 163 mg/dL — ABNORMAL HIGH (ref 70–99)
Potassium: 3.3 mmol/L — ABNORMAL LOW (ref 3.5–5.1)
Sodium: 139 mmol/L (ref 135–145)
Total Bilirubin: 0.6 mg/dL (ref 0.0–1.2)
Total Protein: 6.2 g/dL — ABNORMAL LOW (ref 6.5–8.1)

## 2024-04-12 MED ORDER — INFLUENZA VAC SPLIT HIGH-DOSE 0.5 ML IM SUSY: 0.5000 mL | PREFILLED_SYRINGE | INTRAMUSCULAR | Status: DC

## 2024-04-12 MED ORDER — SODIUM CHLORIDE 0.9 % IV SOLN
Freq: Once | INTRAVENOUS | Status: AC
  Filled 2024-04-12: qty 250

## 2024-04-12 MED ORDER — SODIUM CHLORIDE 0.9 % IV SOLN
10.0000 mg/kg | Freq: Once | INTRAVENOUS | Status: AC
  Administered 2024-04-12: 860 mg via INTRAVENOUS
  Filled 2024-04-12: qty 7.2

## 2024-04-12 MED ORDER — ONDANSETRON HCL 8 MG PO TABS
8.0000 mg | ORAL_TABLET | Freq: Three times a day (TID) | ORAL | 1 refills | Status: DC | PRN
  Filled 2024-04-12: qty 60, 20d supply, fill #0

## 2024-04-12 MED ORDER — INFLUENZA VAC SPLIT HIGH-DOSE 0.5 ML IM SUSY
0.5000 mL | PREFILLED_SYRINGE | INTRAMUSCULAR | Status: AC
  Administered 2024-04-12: 0.5 mL via INTRAMUSCULAR
  Filled 2024-04-12: qty 0.5

## 2024-04-12 NOTE — Assessment & Plan Note (Signed)
 oral potassium 20meq daily

## 2024-04-12 NOTE — Assessment & Plan Note (Addendum)
 S/P Right lower extremity percutaneous revascularization Patient is on aspirin  and Plavix . Patient is interested in seeking acupuncture treatments.  Referral to acupuncture clinic.

## 2024-04-12 NOTE — Assessment & Plan Note (Signed)
 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  Labs are reviewed and discussed with patient. Proceed with Durvalumab 

## 2024-04-12 NOTE — Progress Notes (Signed)
 Hematology/Oncology Progress note [phone] Fax:([phone]) [phone]    CHIEF COMPLAINTS/REASON FOR VISIT:  Follow up for lung cancer  ASSESSMENT & PLAN:   Cancer Staging  Squamous cell carcinoma of right lung (HCC) Staging form: Lung, AJCC 8th Edition - Clinical stage from 09/20/2020: Stage IV (cT1b, cN2, cM1) - Signed by [name], MD on 12/13/2021   Squamous cell carcinoma of right lung (HCC) Durvalumab  was previously held due to pneumonitis.  Labs are reviewed and discussed with patient. Proceed with Durvalumab     Encounter for antineoplastic immunotherapy Durvalumab  treatment as planned.   Hypokalemia oral potassium 20meq daily  Hypothyroidism due to medication TSH, T4 are pending continue levothyroxine  125 mcg daily    Malignant neoplasm metastatic to brain (HCC) 04/11/2024 MRI brain w wo contrast results were reviewed. He is asymptomatic.  He has appointment with [name].   Seizure (HCC) Follow up with [name].  Continue keppra      Follow up  2 weeks lab MD Durvalumab   All questions were answered. The patient knows to call the clinic with any problems, questions or concerns.  Call Babara, MD, PhD [facility] Hematology Oncology 04/12/2024       HISTORY OF PRESENTING ILLNESS:  Joshua [name] is a 68 y.o. male who has above history reviewed by me today presents for follow up visit for Stage IV lung squamous cell carcinoma. Oncology History  Squamous cell carcinoma of right lung (HCC)  09/18/2020 Imaging   PET scan showed right upper lobe nodule maximal SUV 11.5.  Hypermetabolic cluster right paratracheal adenopathy with maximum SUV up to 12.  No findings of metastatic disease to the neck/abdomen/pelvis or skeleton.   09/20/2020 Initial Diagnosis   Squamous cell carcinoma of right lung (HCC) NGS showed PD-L1 TPS 95%, LRP1B S997fs, PIK3CA E545K, RB1 c1422-2A>T, RIT1 T83_ A84del, STK11 P281fs, TPS53 R248W, TMB 19.11mut/mb, MS stable.     09/20/2020 Cancer Staging   Staging form: Lung, AJCC 8th Edition - Clinical stage from 09/20/2020: Stage IV (cT1b, cN2, cM1) - Signed by [name], MD on 12/13/2021   10/03/2020 Imaging   MRI brain with and without contrast showed no evidence of intracranial metastatic disease. Well-defined round lesion within the left parotid tail measuring approximately 2.3 x 1.8 x 1.8 cm   10/09/2020 - 11/27/2020 Radiation Therapy   concurrent chemoradiation   10/10/2020 - 10/31/2020 Chemotherapy   10/10/2020 concurrent chemoradiation.  carboplatin  AUC of 2 and Taxol  45 mg/m2 x 4.  Additional chemotherapy was held due to thrombocytopenia, neutropenia       12/31/2020 - 03/21/2022 Chemotherapy   Maintenance  Durvalumab  q14d      12/31/2020 -  Chemotherapy   Patient is on Treatment Plan : LUNG Durvalumab  (10) q14d     03/28/2021 Imaging   CT without contrast showed new linear right perihilar consolidation with associated traction bronchiectasis.  Likely postradiation changes.  Additional new peribronchial vascular/nodular groundglass opacities with areas of consolidation or seen primarily in the right upper and lower lobes, more distant from expected radiation fields. 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  Per my discussion with pulmonology Dr. 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     05/09/2021 Imaging   Repeat CT chest without contrast showed evolving radiation changes in the right perihilar region likely accounting for interval increase to thickening of the posterior wall of the right upper lobe bronchus.  The  more peripheral right lung groundglass opacities seen on most recent study have generally improved with exception of one peripherally right upper lobe which appears new.  These are likely inflammatory.  No findings highly suspicious for local recurrence.   05/30/2021  - 01/10/2022 Chemotherapy   Durvalumab  q14d     10/07/2021 Imaging   MRI brain with and without contrast showed 2 contrast-enhancing lesions, most consistent with metastatic disease.  Largest lesion is in the left frontal lobe and measures up to 1.3 cm with a large amount of surrounding edema and 9 mm rightward midline shift.  The other enhancing lesion was 4 mm in the right occipital lobe with a small amount of surrounding edema.  No acute or chronic hemorrhage.   10/07/2021 Progression   Headache after colonoscopy, progressive difficulty using her right arm.  Progressively worsening of speech. Stage IV lung cancer with brain metastasis.  started on Dexamethasone .  He was seen by neurosurgery and radiation oncology.  Recommendation is to proceed with SRS.  Patient was discharged with dexamethasone    10/16/2021 Imaging   PET scan showed postradiation changes in the right supra hilar lung without evidence of local recurrence.  No evidence of lung cancer recurrence or metastatic disease.  Single focus of intense metabolic activity associated with posterior left 11th rib likely secondary to recent fall/posttraumatic metabolic changes.  Frontal  uptake in the left frontal lobe corresponding to the known metastatic lesion in the brain   10/29/2021 -  Radiation Therapy   Brain Radiation [facility]   12/10/2021 Imaging   Brain MRI: Continued interval decrease in size of a left frontal lobe metastasis, now measuring 10 mm. Moderate surrounding edema has also decreased.  3-4 mm metastasis within the right occipital lobe, not significantly changed in size. There is now a small focus of central non enhancement within this lesion. Increased edema. 3 mm enhancing metastasis within the high left parietal lobe. In retrospect, this was likely present as a subtle punctate focus of enhancement on the prior brain MRI of 10/22/2021.   01/18/2022 Imaging   CT chest abdomen pelvis w contrast 1. Stable post treatment changes in the  right upper lobe medially with dense radiation fibrosis but no findings to suggest local recurrent tumor. 2. Significant progression of ill-defined ground-glass opacity, interstitial thickening and airspace nodularity in the right upper lobe and upper aspect of the right lower lobe. Findings could reflect an inflammatory or atypical infectious process, drug induced pneumonitis or interstitial spread of tumor.3. No mediastinal or hilar mass or adenopathy. 4. No findings for abdominal/pelvic metastatic disease or osseous metastatic disease.   02/19/2022 Imaging   MRI brain w wo contrast showed Three metastatic deposits in the brain appear smaller. Decreased edema in the left parietal lobe. No new lesions    04/04/2022 Imaging   CT chest w contrast 1. Stable radiation changes in the right perihilar region without evidence of local recurrence. 2. Interval improvement in the patchy ground-glass opacities previously demonstrated in both lungs, most consistent with a resolving infectious or inflammatory process (including immunotherapy-related pneumonitis). 3. No evidence of metastatic disease.   05/14/2022 Miscellaneous   He had a mechanical fall and fractured his left elbow. xrays showed a left elbow fracture  05/26/22 seen by orthopedic surgeon, cast applied.    05/27/2022 Imaging   MRI brain w wo contrast 1. All three known brain metastases have enlarged since August, with increasing regional edema. And there is a small new left anterior cerebellar 4-5 mm metastasis (was  questionably punctate on the May MRI). This constellation could reflect a combination of True Progression (Left Cerebellar lesion) and pseudoprogression/radionecrosis (3 existing mets).    2. No significant intracranial mass effect. 3. Underlying moderate cerebral white matter signal changes.   05/30/2022 Imaging   CT chest abdomen pelvis w contrast  1. Stable examination demonstrating chronic postradiation mass-like fibrosis in  the right lung with resolving postradiation pneumonitis in the adjacent lung parenchyma. There is a stable small right pleural effusion, but no definitive findings to suggest residual or recurrent disease on today's examination. 2. No signs of metastatic disease in the abdomen or pelvis.  3. Aortic atherosclerosis, in addition to two-vessel coronary artery disease. Please note that although the presence of coronary artery calcium  documents the presence of coronary artery disease, the severity of this disease and any potential stenosis cannot be assessed on this non-gated CT examination. Assessment for potential risk factor modification, dietary therapy or pharmacologic therapy may be warranted, if clinically indicated. 4. Hepatic steatosis. 5. Colonic diverticulosis without evidence of acute diverticulitis at this time. 6. Incompletely imaged but apparently occluded vascular graft in the superior aspect of the right anterior thigh. Referral to vascular specialist for further clinical evaluation should be considered if clinically appropriate. 7. Additional incidental findings, as above.   07/22/2022 Imaging   MRI brain  1. No new or enlarging lesions. No intracranial disease progression. 2. Decreased size of treated lesions of the left frontoparietal junction and left postcentral gyrus. 3. Unchanged lesions in the left cerebellum and right occipital lobe.   10/15/2022 Imaging   MRI brain w wo contrast showed Interval increase in size of multiple intraparenchymal lesions with worsened surrounding vasogenic edema. 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 No new lesions   11/05/2022 Imaging   MRI brain w wo contrast  1. Likely slight increase and right occipital and left cerebellar lesions. Otherwise, unchanged metastases, as above. No new lesions identified. 2. Partially imaged large probable lymph node in the left upper  neck, apparent on prior PET CT.   11/13/2022 -  Radiation Therapy   Each of the sites were treated with SRS/SBRT/SRT-IMRT technique to 20 Gy in 1 fraction to each of the following sites: PTV_2_LCerebel_66mm PTV_3_RCerebel_58mm PTV_4_ROccip_8mm PTV_5_LParietal_81mm   11/27/2022 Imaging   CT chest abdomen pelvis w contrast showed  Improving bilateral scattered interstitial and ground-glass opacities. Small amount of residual areas in the extreme left lower lobe inferiorly. This specific area is slightly increased from previous but overall the amount of opacities is significantly improved. Recommend continued follow-up. No developing new mass lesion, fluid collection or lymph node enlargement. Stable posttreatment changes right perihilar in the lung. Fatty liver infiltration. Colonic diverticulosis. Improved tiny right pleural effusion   02/05/2023 Imaging   MRI brain with and without contrast Interval decrease in all of the metastatic lesions, detailed above.No new lesions identified.    Imaging   CT chest abdomen pelvis w contrast showed  1. 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 2. Trace right pleural fluid or thickening, new or minimally increased. 3. Incidental findings, including: Aortic atherosclerosis (ICD10-I70.0), coronary artery atherosclerosis and emphysema (ICD10-J43.9). Hepatic steatosis.   06/25/2023 Imaging   CT chest abdomen pelvis w contrast showed 1. Unchanged post treatment/post radiation appearance of the right lung, with perihilar and suprahilar paramedian fibrosis and consolidation. 2. No evidence of lymphadenopathy or metastatic disease in the chest, abdomen, or pelvis. 3.  Hepatic steatosis. 4. Coronary artery disease.   Aortic Atherosclerosis (ICD10-I70.0) and Emphysema (ICD10-J43.9).   08/26/2023 [facility] Admission   hospitalization due to was observed speech and jerking movements on right  side despite being on dexamethasone . Continuous EEG suggest cortical dysfunction arising from left temporal region.  No seizure or elective form discharges were seen. Patient was discharged on dexamethasone  4 mg, Keppra , lamotrigine .    11/04/2023 -  Chemotherapy   Add Bevacizumab  5mg /kg to Durvalumab .     11/17/2023 - 11/27/2023 Radiation Therapy   Patient received radiation to brain for treatment of recurrence.   02/23/2024 Imaging   CT chest abdomen pelvis w contrast showed  CHEST:   1. Post RIGHT upper lobe resection. 2. No evidence of lung cancer recurrence. 3. No mediastinal adenopathy.   PELVIS:   1. No evidence of metastatic disease in the abdomen pelvis. 2. No skeletal metastasis.     INTERVAL HISTORY Joshua [name] is a 68 y.o. male who has above history reviewed by me today presents for follow up visit for right lung RCC.  chronic SOB,not worse.  Right lower extremity wound has healed well.  Peripheral artery disease,Patient is on aspirin  and Plavix . He has nausea intermittently.  He took Compazine  10 mg as needed which did not help much.  Has decreased appetite and lost weight.   Review of Systems  Constitutional:  Positive for fatigue and unexpected weight change. Negative for appetite change, chills and fever.  HENT:   Negative for hearing loss and voice change.   Eyes:  Negative for eye problems and icterus.  Respiratory:  Negative for chest tightness, cough and shortness of breath.   Cardiovascular:  Positive for leg swelling. Negative for chest pain.  Gastrointestinal:  Positive for nausea. Negative for abdominal distention, abdominal pain and vomiting.  Endocrine: Negative for hot flashes.  Genitourinary:  Negative for difficulty urinating, dysuria and frequency.   Musculoskeletal:  Positive for gait problem. Negative for arthralgias.  Skin:  Negative for itching and rash.  Neurological:  Positive for gait problem. Negative for extremity weakness,  headaches, numbness and speech difficulty.  Hematological:  Negative for adenopathy. Bruises/bleeds easily.  Psychiatric/Behavioral:  Negative for confusion.       MEDICAL HISTORY:  Past Medical History:  Diagnosis Date   Allergy    Cancer (HCC)    lung   COPD (chronic obstructive pulmonary disease) (HCC)    Dyspnea    former smoker. 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   Malignant neoplasm metastatic to brain [facility])    Nausea with vomiting 02/01/2024   Peripheral vascular disease    Pre-diabetes    Sleep apnea     SURGICAL HISTORY: Past Surgical History:  Procedure Laterality Date   ANTERIOR CERVICAL DECOMP/DISCECTOMY FUSION N/A 03/14/2020   Procedure: ANTERIOR CERVICAL DECOMPRESSION/DISCECTOMY FUSION 3 LEVELS C4-7;  Surgeon: Clois Fret, MD;  Location: [facility] ORS;  Service: Neurosurgery;  Laterality: N/A;   BACK SURGERY  2011   CENTRAL LINE INSERTION Right 09/10/2016   Procedure: CENTRAL LINE INSERTION;  Surgeon: Jama Cordella MATSU, MD;  Location: [facility] ORS;  Service: Vascular;  Laterality: Right;   CERVICAL FUSION     C4-c7 on Mar 14, 2020   COLONOSCOPY  2013 ?   COLONOSCOPY N/A 09/09/2021   Procedure: COLONOSCOPY;  Surgeon: Onita Elspeth Sharper, DO;  Location: [facility] ENDOSCOPY;  Service: Gastroenterology;  Laterality: N/A;   CORONARY ANGIOPLASTY  ESOPHAGOGASTRODUODENOSCOPY N/A 09/09/2021   Procedure: ESOPHAGOGASTRODUODENOSCOPY (EGD);  Surgeon: Onita Elspeth Sharper, DO;  Location: [facility] ENDOSCOPY;  Service: Gastroenterology;  Laterality: N/A;   FEMORAL-POPLITEAL BYPASS GRAFT Right 09/10/2016   Procedure: BYPASS GRAFT FEMORAL-POPLITEAL ARTERY ( BELOW KNEE );  Surgeon: Jama Cordella MATSU, MD;  Location: [facility] ORS;  Service: Vascular;  Laterality: Right;   FEMORAL-TIBIAL BYPASS GRAFT Right 03/23/2020   Procedure: BYPASS GRAFT RIGHT FEMORAL- DISTAL TIBIAL ARTERY WITH DISTAL FLOW GRAFT;  Surgeon: Jama Cordella MATSU,  MD;  Location: [facility] ORS;  Service: Vascular;  Laterality: Right;   LEFT HEART CATH AND CORONARY ANGIOGRAPHY Left 05/16/2021   Procedure: LEFT HEART CATH AND CORONARY ANGIOGRAPHY;  Surgeon: Lawyer Bernardino Cough, MD;  Location: [facility] INVASIVE CV LAB;  Service: Cardiovascular;  Laterality: Left;   LOWER EXTREMITY ANGIOGRAPHY Right 11/18/2016   Procedure: Lower Extremity Angiography;  Surgeon: Cordella MATSU Jama, MD;  Location: [facility] INVASIVE CV LAB;  Service: Cardiovascular;  Laterality: Right;   LOWER EXTREMITY ANGIOGRAPHY Right 12/09/2016   Procedure: Lower Extremity Angiography;  Surgeon: Jama Cordella MATSU, MD;  Location: [facility] INVASIVE CV LAB;  Service: Cardiovascular;  Laterality: Right;   LOWER EXTREMITY ANGIOGRAPHY Right 11/15/2019   Procedure: LOWER EXTREMITY ANGIOGRAPHY;  Surgeon: Jama Cordella MATSU, MD;  Location: [facility] INVASIVE CV LAB;  Service: Cardiovascular;  Laterality: Right;   LOWER EXTREMITY ANGIOGRAPHY Right 12/01/2019   Procedure: Lower Extremity Angiography;  Surgeon: Marea Selinda RAMAN, MD;  Location: [facility] INVASIVE CV LAB;  Service: Cardiovascular;  Laterality: Right;   LOWER EXTREMITY ANGIOGRAPHY Right 12/02/2019   Procedure: LOWER EXTREMITY ANGIOGRAPHY;  Surgeon: Jama Cordella MATSU, MD;  Location: [facility] INV[facility]IVE CV LAB;  Service: Cardiovascular;  Laterality: Right;   LOWER EXTREMITY ANGIOGRAPHY Right 03/23/2020   Procedure: Lower Extremity Angiography;  Surgeon: Jama Cordella MATSU, MD;  Location: [facility] IN[facility] CV LAB;  Service: Cardiovascular;  Laterality: Right;   LOWER EXTREMITY ANGIOGRAPHY Right 09/25/2021   Procedure: Lower Extremity Angiography;  Surgeon: Jama Cordella MATSU, MD;  Location: [facility] INV[facility] CV LAB;  Service: Cardiovascular;  Laterality: Right;   LOWER EXTREMITY ANGIOGRAPHY Right 09/26/2021   Procedure: Lower Extremity Angiography;  Surgeon: Marea Selinda RAMAN, MD;  Location: [facility] INV[facility] CV LAB;  Service: Cardiovascular;  Laterality: Right;   LOWER EXTREMITY ANGIOGRAPHY Right 04/08/2022    Procedure: Lower Extremity Angiography;  Surgeon: Jama Cordella MATSU, MD;  Location: [facility] IN[facility] LAB;  Service: Cardiovascular;  Laterality: Right;   LOWER EXTREMITY ANGIOGRAPHY Right 03/04/2024   Procedure: Lower Extremity Angiography;  Surgeon: Jama Cordella MATSU, MD;  Location: [facility] IN[facility] LAB;  Service: Cardiovascular;  Laterality: Right;   LOWER EXTREMITY ANGIOGRAPHY Right 03/25/2024   Procedure: Lower Extremity Angiography;  Surgeon: Jama Cordella MATSU, MD;  Location: [facility];  Service: Cardiovascular;  Laterality: Right;   LOWER EXTREMITY INTERVENTION  11/18/2016   Procedure: Lower Extremity Intervention;  Surgeon: Cordella MATSU Jama, MD;  Location: [facility] LAB;  Service: Cardiovascular;;   LOWER EXTREMITY INTERVENTION Right 03/04/2024   Procedure: LOWER EXTREMITY INTERVENTION;  Surgeon: Jama Cordella MATSU, MD;  Location: [facility];  Service: Cardiovascular;  Laterality: Right;   PERIPHERAL VASCULAR CATHETERIZATION Right 01/02/2015   Procedure: Lower Extremity Angiography;  Surgeon: Cordella MATSU Jama, MD;  Location: [facility];  Service: Cardiovascular;  Laterality: Right;   PERIPHERAL VASCULAR CATHETERIZATION Right 06/18/2015   Procedure: Lower Extremity Angiography;  Surgeon: Selinda RAMAN Marea, MD;  Location: [facility];  Service: Cardiovascular;  Laterality: Right;   PERIPHERAL VASCULAR  CATHETERIZATION  06/18/2015   Procedure: Lower Extremity Intervention;  Surgeon: Selinda GORMAN Gu, MD;  Location: [facility];  Service: Cardiovascular;;   PERIPHERAL VASCULAR CATHETERIZATION N/A 10/09/2015   Procedure: Abdominal Aortogram w/Lower Extremity;  Surgeon: Cordella KANDICE Shawl, MD;  Location: [facility];  Service: Cardiovascular;  Laterality: N/A;   PERIPHERAL VASCULAR CATHETERIZATION  10/09/2015   Procedure: Lower Extremity Intervention;  Surgeon: Cordella KANDICE Shawl, MD;  Location: [facility];  Service: Cardiovascular;;    PERIPHERAL VASCULAR CATHETERIZATION Right 10/10/2015   Procedure: Lower Extremity Angiography;  Surgeon: Selinda GORMAN Gu, MD;  Location: [facility];  Service: Cardiovascular;  Laterality: Right;   PERIPHERAL VASCULAR CATHETERIZATION Right 10/10/2015   Procedure: Lower Extremity Intervention;  Surgeon: Selinda GORMAN Gu, MD;  Location: [facility];  Service: Cardiovascular;  Laterality: Right;   PERIPHERAL VASCULAR CATHETERIZATION Right 12/03/2015   Procedure: Lower Extremity Angiography;  Surgeon: Selinda GORMAN Gu, MD;  Location: [facility];  Service: Cardiovascular;  Laterality: Right;   PERIPHERAL VASCULAR CATHETERIZATION Right 12/04/2015   Procedure: Lower Extremity Angiography;  Surgeon: Selinda GORMAN Gu, MD;  Location: [facility];  Service: Cardiovascular;  Laterality: Right;   PERIPHERAL VASCULAR CATHETERIZATION  12/04/2015   Procedure: Lower Extremity Intervention;  Surgeon: Selinda GORMAN Gu, MD;  Location: [facility];  Service: Cardiovascular;;   PERIPHERAL VASCULAR CATHETERIZATION Right 06/30/2016   Procedure: Lower Extremity Angiography;  Surgeon: Selinda GORMAN Gu, MD;  Location: [facility];  Service: Cardiovascular;  Laterality: Right;   PERIPHERAL VASCULAR CATHETERIZATION Right 06/25/2016   Procedure: Lower Extremity Angiography;  Surgeon: Cordella KANDICE Shawl, MD;  Location: [facility];  Service: Cardiovascular;  Laterality: Right;   PERIPHERAL VASCULAR CATHETERIZATION Right 07/01/2016   Procedure: Lower Extremity Angiography;  Surgeon: Cordella KANDICE Shawl, MD;  Location: [facility];  Service: Cardiovascular;  Laterality: Right;   PERIPHERAL VASCULAR CATHETERIZATION  07/01/2016   Procedure: Lower Extremity Intervention;  Surgeon: Cordella KANDICE Shawl, MD;  Location: [facility];  Service: Cardiovascular;;   PERIPHERAL VASCULAR CATHETERIZATION Right 08/01/2016   Procedure: Lower Extremity Angiography;  Surgeon: Cordella KANDICE Shawl, MD;   Location: [facility];  Service: Cardiovascular;  Laterality: Right;   PORTA CATH INSERTION N/A 10/05/2020   Procedure: PORTA CATH INSERTION;  Surgeon: Shawl Cordella KANDICE, MD;  Location: [facility];  Service: Cardiovascular;  Laterality: N/A;   stent placement in right leg Right    VIDEO BRONCHOSCOPY N/A 09/14/2020   Procedure: VIDEO BRONCHOSCOPY WITH FLUORO;  Surgeon: Tamea Dedra CROME, MD;  Location: [facility];  Service: Cardiopulmonary;  Laterality: N/A;   VIDEO BRONCHOSCOPY WITH ENDOBRONCHIAL ULTRASOUND N/A 09/14/2020   Procedure: VIDEO BRONCHOSCOPY WITH ENDOBRONCHIAL ULTRASOUND;  Surgeon: Tamea Dedra CROME, MD;  Location: [facility] ORS;  Service: Cardiopulmonary;  Laterality: N/A;    SOCIAL HISTORY: Social History   Socioeconomic History   Marital status: Single    Spouse name: Not on file   Number of children: Not on file   Years of education: Not on file   Highest education level: Not on file  Occupational History   Occupation: unemployed  Tobacco Use   Smoking status: Former    Current packs/day: 0.00    Average packs/day: 2.0 packs/day for 38.0 years (76.0 ttl pk-yrs)    Types: Cigarettes    Start date: 12/19/1976    Quit date: 12/20/2014    Years since quitting: 9.3  Smokeless tobacco: Former   Tobacco comments:    quit smoking in 2017 most smoked 2   Vaping Use   Vaping status: Never Used  Substance and Sexual Activity   Alcohol  use: Not Currently   Drug use: No   Sexual activity: Yes  Other Topics Concern   Not on file  Social History Narrative   Not on file   Social Drivers of Health   Financial Resource Strain: Medium Risk (02/05/2024)   Overall Financial Resource Strain (CARDIA)    Difficulty of Paying Living Expenses: Somewhat hard  Food Insecurity: No Food Insecurity (03/25/2024)   Hunger Vital Sign    Worried About Running Out of Food in the Last Year: Never true    Ran Out of Food in the Last Year: Never true  Transportation Needs: No  Transportation Needs (03/25/2024)   PRAPARE - Administrator, Civil Service (Medical): No    Lack of Transportation (Non-Medical): No  Physical Activity: Inactive (02/05/2024)   Exercise Vital Sign    Days of Exercise per Week: 1 day    Minutes of Exercise per Session: 0 min  Stress: No Stress Concern Present (02/05/2024)   [employer] of Occupational Health - Occupational Stress Questionnaire    Feeling of Stress: Only a little  Social Connections: Socially Isolated (03/25/2024)   Social Connection and Isolation Panel    Frequency of Communication with Friends and Family: Three times a week    Frequency of Social Gatherings with Friends and Family: Once a week    Attends Religious Services: Never    Database administrator or Organizations: No    Attends Banker Meetings: Never    Marital Status: Never married  Intimate Partner Violence: Not At Risk (03/25/2024)   Humiliation, Afraid, Rape, and Kick questionnaire    Fear of Current or Ex-Partner: No    Emotionally Abused: No    Physically Abused: No    Sexually Abused: No    FAMILY HISTORY: Family History  Problem Relation Age of Onset   Diabetes Mother    Hypertension Mother    Heart murmur Mother    Leukemia Mother    Throat cancer Maternal Grandmother    Colon cancer Neg Hx    Breast cancer Neg Hx     ALLERGIES:  has no known allergies.  MEDICATIONS:  Current Outpatient Medications  Medication Sig Dispense Refill   aspirin  EC 81 MG tablet Take 81 mg by mouth daily. Swallow whole.     clopidogrel  (PLAVIX ) 75 MG tablet TAKE 1 TABLET BY MOUTH DAILY 100 tablet 2   levETIRAcetam  (KEPPRA ) 1000 MG tablet Take 1 tablet (1,000 mg total) by mouth 2 (two) times daily. 60 tablet 2   levothyroxine  (SYNTHROID ) 125 MCG tablet Take 1 tablet (125 mcg total) by mouth daily before breakfast. 90 tablet 0   ondansetron  (ZOFRAN ) 8 MG tablet Take 1 tablet (8 mg total) by mouth every 8 (eight) hours as needed for  nausea or vomiting. 60 tablet 1   potassium chloride  SA (KLOR-CON  M) 20 MEQ tablet Take 1 tablet (20 mEq total) by mouth daily. 30 tablet 2   rosuvastatin  (CRESTOR ) 40 MG tablet Take 1 tablet (40 mg total) by mouth daily. 90 tablet 3   No current facility-administered medications for this visit.   Facility-Administered Medications Ordered in Other Visits  Medication Dose Route Frequency Provider Last Rate Last Admin   albuterol  (PROVENTIL ) (2.5 MG/3ML) 0.083% nebulizer solution 2.5 mg  2.5 mg Nebulization Once Tamea Dedra CROME, MD       heparin  lock flush 100 unit/mL  500 Units Intracatheter Once PRN [name], MD         PHYSICAL EXAMINATION: ECOG PERFORMANCE STATUS: 1 - Symptomatic but completely ambulatory Vitals:   04/12/24 0926  BP: 122/82  Pulse: 100  Resp: 20  Temp: 97.6 F (36.4 C)  SpO2: 100%    Filed Weights   04/12/24 0926  Weight: 184 lb 9.6 oz (83.7 kg)     Physical Exam Constitutional:      General: He is not in acute distress.    Appearance: He is obese.  HENT:     Head: Normocephalic and atraumatic.  Eyes:     General: No scleral icterus. Cardiovascular:     Rate and Rhythm: Normal rate and regular rhythm.  Pulmonary:     Effort: Pulmonary effort is normal. No respiratory distress.     Breath sounds: Normal breath sounds. No wheezing.  Abdominal:     General: There is no distension.     Palpations: Abdomen is soft.     Tenderness: There is no abdominal tenderness.  Musculoskeletal:     Cervical back: Normal range of motion and neck supple.     Comments: Right lower extremity wrapped.   Skin:    General: Skin is warm and dry.     Findings: Bruising present. No erythema.  Neurological:     Mental Status: He is alert and oriented to person, place, and time. Mental status is at baseline.     Cranial Nerves: No cranial nerve deficit.     Motor: No abnormal muscle tone.  Psychiatric:        Mood and Affect: Mood and affect normal.          LABORATORY DATA:  I have reviewed the data as listed    Latest Ref Rng & Units 04/12/2024    8:48 AM 03/28/2024   12:54 PM 03/26/2024    4:00 AM  CBC  WBC 4.0 - 10.5 K/uL 4.6  6.0  5.5   Hemoglobin 13.0 - 17.0 g/dL 88.8  88.4  88.9   Hematocrit 39.0 - 52.0 % 35.2  35.9  35.0   Platelets 150 - 400 K/uL 285  226  193       Latest Ref Rng & Units 04/12/2024    8:48 AM 03/28/2024   12:54 PM 03/25/2024    7:24 AM  CMP  Glucose 70 - 99 mg/dL 836  836    BUN 8 - 23 mg/dL 8  6  9    Creatinine 0.61 - 1.24 mg/dL 8.91  9.01  9.12   Sodium 135 - 145 mmol/L 139  134    Potassium 3.5 - 5.1 mmol/L 3.3  3.5    Chloride 98 - 111 mmol/L 106  99    CO2 22 - 32 mmol/L 21  22    Calcium  8.9 - 10.3 mg/dL 8.7  8.7    Total Protein 6.5 - 8.1 g/dL 6.2  6.4    Total Bilirubin 0.0 - 1.2 mg/dL 0.6  0.4    Alkaline Phos 38 - 126 U/L 75  74    AST 15 - 41 U/L 32  37    ALT 0 - 44 U/L 8  8       RADIOGRAPHIC STUDIES: I have personally reviewed the radiological images as listed and agreed with the findings in the report. MR BRAIN W  WO CONTRAST Result Date: 04/11/2024 CLINICAL DATA:  Brain/CNS neoplasm, assess treatment response EXAM: MRI HEAD WITHOUT AND WITH CONTRAST TECHNIQUE: Multiplanar, multiecho pulse sequences of the brain and surrounding structures were obtained without and with intravenous contrast. CONTRAST:  9 mL of Vueway  COMPARISON:  MRI head January 04, 2024. FINDINGS: Brain: Slight increase in size of a centrally necrotic 9 mm lesion in the right occipital lobe. Similar versus slightly decreased necrotic left parietal lesion which measures approximately 2.1 x 1.9 cm. Similar versus slightly improved/decreased 1.1 x 1.1 cm lesion in the left frontal operculum. Unchanged punctate focus of enhancement in the left cerebellum (series 14, image 42). Edema surrounding these lesions is not substantially changed. No progressive mass effect. No midline shift. No evidence of acute infarct, acute  hemorrhage, or hydrocephalus. Vascular: Normal flow voids. Skull and upper cervical spine: Normal marrow signal. Sinuses/Orbits: Clear sinuses.  No acute findings. IMPRESSION: 1. 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 This could represent treatment related change or progression. Recommend attention on follow-up. 2. 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 3. Unchanged punctate lesion in the left cerebellum, most likely a vessel but warranting attention on follow-up. 4. 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 Recommend ENT follow-up if not previously evaluated. Electronically Signed   By: Gilmore GORMAN Molt M.D.   On: 04/11/2024 13:00   PERIPHERAL VASCULAR CATHETERIZATION Result Date: 03/25/2024 See surgical note for result.  PERIPHERAL VASCULAR CATHETERIZATION Result Date: 03/04/2024 See surgical note for result.  CT CHEST ABDOMEN PELVIS W CONTRAST Result Date: 03/01/2024 CLINICAL DATA:  Lung cancer. Assess treatment response. * Tracking Code: BO * EXAM: CT CHEST, ABDOMEN, AND PELVIS WITH CONTRAST TECHNIQUE: Multidetector CT imaging of the chest, abdomen and pelvis was performed following the standard protocol during bolus administration of intravenous contrast. RADIATION DOSE REDUCTION: This exam was performed according to the departmental dose-optimization program which includes automated exposure control, adjustment of the mA and/or kV according to patient size and/or use of iterative reconstruction technique. CONTRAST:  OMNIPAQUE  IOHEXOL  300 MG/ML  SOLN COMPARISON:  CT 526 1,025 com PET-CT 2000 FINDINGS: CT CHEST FINDINGS Cardiovascular: Port in the anterior chest wall with tip in distal SVC. No significant vascular findings. Normal heart size. No pericardial effusion. Mediastinum/Nodes: No axillary or supraclavicular adenopathy. No mediastinal  or hilar adenopathy. No pericardial fluid. Esophagus normal. Lungs/Pleura: Post RIGHT upper lobe resection. RIGHT suprahilar peribronchial thickening unchanged from prior. No new nodularity the RIGHT lung. LEFT lung clear. Musculoskeletal: No aggressive osseous lesion. CT ABDOMEN AND PELVIS FINDINGS Hepatobiliary: No focal hepatic lesion. No biliary ductal dilatation. Gallbladder is normal. Common bile duct is normal. Pancreas: Pancreas is normal. No ductal dilatation. No pancreatic inflammation. Spleen: Normal spleen Adrenals/urinary tract: Adrenal glands and kidneys are normal. The ureters and bladder normal. Stomach/Bowel: Stomach, small bowel, appendix, and cecum are normal. Multiple diverticula of the descending colon and sigmoid colon without acute inflammation. Vascular/Lymphatic: Abdominal aorta is normal caliber. There is no retroperitoneal or periportal lymphadenopathy. No pelvic lymphadenopathy. Reproductive: Unremarkable Other: No free fluid. Musculoskeletal: No aggressive osseous lesion. IMPRESSION: CHEST: 1. Post RIGHT upper lobe resection. 2. No evidence of lung cancer recurrence. 3. No mediastinal adenopathy. PELVIS: 1. No evidence of metastatic disease in the abdomen pelvis. 2. No skeletal metastasis. Electronically Signed   By: Jackquline Boxer M.D.   On: 03/01/2024 16:37   VAS US  ABI WITH/WO TBI  Result Date: 02/29/2024  LOWER EXTREMITY DOPPLER STUDY Patient Name:  EMILIANO WELSHANS  Date of Exam:   02/25/2024 Medical Rec #: [id number]            Accession #:    [id number] Date of Birth: [birth year]           Patient Gender: M Patient Age:   68 years Exam Location:  [facility] Procedure:      VAS US  ABI WITH/WO TBI Referring Phys: --------------------------------------------------------------------------------  Indications: Ulceration, peripheral artery disease, and Routine f/u. Patient              reports no claudication or lower extremity pain. High Risk Factors: Hyperlipidemia,  past history of smoking. Other Factors: ASO s/p multiple interventions and bypass.                11/15/2019 Right thrombectomy of Fempop insitu graft with added                stent throughout.                 12/01/2019: Aortogram and Selective Right Lower Extremity                Angiogram. 6 mg of TPA delivered in a lysis catheter the Origin                of the Right Femoral -Popliteal bypass down the proximal ATA with                placement of the lysis.                 12/02/2019: PTA of the Right Posterior Tibial Artery. PTA of the                Right Peroneal Artery. Mechanical Thrombectomy of the Right                Femoral -Popliteal Bypass graft. Mechanical Thrombectomy of the                Right Tibioperoneal Trunk and Posterior Tibial Artery.                 09/25/2021: Right Lower Extremity Angiography third order                catheter placement. PTA of the proximal peroneal and                tibioperoneal trunk to 2.5 mm with an ultra score balloon. PTA of                the Femoral Artery and the proximal anastomosis of the bypass                graft to 4 mm with an ultra score balloon. Administration of 5 mg                of tpa throughout the entire bypass and in the peroneal Artery.                Initiation of continuous tpa infusion Right lower Extremity for                limb salvage. Insertion of a triple lumen catheter Left CFV with                US  and fluoroscopic guidance.  09/26/2021: Right LE Angiogram. PTA of the Right Posterior Tibial                Artery with 2.5 mm diameter by 30 cm length angioplasty balloon.                PTA of the distal bypass anastomosis and distal bypass graft                analogous to the Popliteal artery with 4 mm diameter by 8 cm                length Lutonix drug coated angioplasty balloon.                04/08/2022 right thrombectomy of the Deep profunda.  Vascular Interventions: Hx of multiple right lower extremity  PTA's/stents.                         09/10/2016 Right femoral to below the knee popliteal                         artery bypass graft placement. 5/01 Right bypass graft &                         mid popliteal artery PTA & coil embolectomy of large                         branch of bypass graft. 12/09/16 Right distal bypass                         graft PTA/stent and right posterior tibial artery PTA.                         03/23/2020 Rt PTA thrombectomy. Rt Profunda fem artery to                         PTA bypass. Performing Technologist: Donnice Charnley RVT  Examination Guidelines: A complete evaluation includes at minimum, Doppler waveform signals and systolic blood pressure reading at the level of bilateral brachial, anterior tibial, and posterior tibial arteries, when vessel segments are accessible. Bilateral testing is considered an integral part of a complete examination. Photoelectric Plethysmograph (PPG) waveforms and toe systolic pressure readings are included as required and additional duplex testing as needed. Limited examinations for reoccurring indications may be performed as noted.  ABI Findings: +---------+------------------+-----+----------+--------+ Right    Rt Pressure (mmHg)IndexWaveform  Comment  +---------+------------------+-----+----------+--------+ Brachial 167                                       +---------+------------------+-----+----------+--------+ PTA      61                0.37 monophasic         +---------+------------------+-----+----------+--------+ DP       54                0.32 monophasic         +---------+------------------+-----+----------+--------+ Great Toe43                0.26                    +---------+------------------+-----+----------+--------+ +---------+------------------+-----+---------+-------+  Left     Lt Pressure (mmHg)IndexWaveform Comment +---------+------------------+-----+---------+-------+ Brachial 164                                      +---------+------------------+-----+---------+-------+ PTA      165               0.99 biphasic         +---------+------------------+-----+---------+-------+ DP       189               1.13 triphasic        +---------+------------------+-----+---------+-------+ Great Toe125               0.75                  +---------+------------------+-----+---------+-------+ +-------+-----------+-----------+------------+------------+ ABI/TBIToday's ABIToday's TBIPrevious ABIPrevious TBI +-------+-----------+-----------+------------+------------+ Right  0.37       0.26       0.44        0.24         +-------+-----------+-----------+------------+------------+ Left   1.13       0.75       1.13        0.61         +-------+-----------+-----------+------------+------------+ Bilateral ABIs appear essentially unchanged compared to prior study on 07/09/2023.  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 The right toe-brachial index is abnormal. Left: Resting left ankle-brachial index is within normal range. The left toe-brachial index is normal. *See table(s) above for measurements and observations.  Electronically signed by [name] MD on 02/29/2024 at 7:39:27 AM.    Final

## 2024-04-12 NOTE — Progress Notes (Signed)
 Nutrition Follow-up:  Patient with stage IV lung SCC.  Patient receiving durvalumab .    Met with patient during infusion.  Still not appetite.  Megace on back order, unable to get.  Ate few bites of baked beans and barbecue and hushpuppies last night for dinner.  Drank an ensure shake during infusion but does not like the smell of it.  Has purchased shakes but they are still sitting by his front door and he has not put them in the refrigerator yet.  Feels queasy at times and has taste alterations    Medications: KCL,  zofran  8mg  today  Labs: K 3.3, glucose 163  Anthropometrics:   Weight 184 lb 9.6 oz today 189 lb 11.2 oz on 9/8 189 lb on 8/28 222 lb 3.2 oz on 5/14   NUTRITION DIAGNOSIS: Inadequate oral intake continues    INTERVENTION:  Encouraged nausea medication is feeling queasy Patient says that he can drink 2 shakes a day    MONITORING, EVALUATION, GOAL: weight trends, intake   NEXT VISIT: Tuesday, Oct 21 phone call  Joshua [name] B. Dasie SOLON, CSO, LDN Registered Dietitian [phone]

## 2024-04-12 NOTE — Assessment & Plan Note (Signed)
 Recommend Zofran  8 mg every 8 hours as needed.

## 2024-04-12 NOTE — Assessment & Plan Note (Signed)
 TSH, T4 are pending continue levothyroxine  125 mcg daily

## 2024-04-12 NOTE — Assessment & Plan Note (Signed)
 Follow up with [name].  Continue keppra 

## 2024-04-12 NOTE — Assessment & Plan Note (Signed)
Durvalumab treatment as planned.  

## 2024-04-12 NOTE — Assessment & Plan Note (Addendum)
 Off bevacizumab  04/11/2024 MRI brain w wo contrast results were reviewed. He is asymptomatic.  He has appointment with [name].

## 2024-04-13 ENCOUNTER — Telehealth: Payer: Self-pay | Admitting: *Deleted

## 2024-04-13 ENCOUNTER — Telehealth: Payer: Self-pay

## 2024-04-13 ENCOUNTER — Other Ambulatory Visit: Payer: Self-pay

## 2024-04-13 NOTE — Telephone Encounter (Signed)
 Acupuncture approval sent to [facility].   Their office will call to set up the appointment.

## 2024-04-13 NOTE — Telephone Encounter (Signed)
 PA for Ondansetron  8mg  completed and sent to plan via covermymeds portal

## 2024-04-15 ENCOUNTER — Encounter: Payer: Self-pay | Admitting: Internal Medicine

## 2024-04-15 ENCOUNTER — Encounter: Payer: Self-pay | Admitting: Oncology

## 2024-04-15 ENCOUNTER — Inpatient Hospital Stay (HOSPITAL_BASED_OUTPATIENT_CLINIC_OR_DEPARTMENT_OTHER): Admitting: Internal Medicine

## 2024-04-15 VITALS — BP 122/76 | HR 112 | Temp 98.6°F | Resp 19 | Wt 183.2 lb

## 2024-04-15 DIAGNOSIS — C7931 Secondary malignant neoplasm of brain: Secondary | ICD-10-CM | POA: Diagnosis not present

## 2024-04-15 DIAGNOSIS — Z5112 Encounter for antineoplastic immunotherapy: Secondary | ICD-10-CM | POA: Diagnosis not present

## 2024-04-15 DIAGNOSIS — G40909 Epilepsy, unspecified, not intractable, without status epilepticus: Secondary | ICD-10-CM

## 2024-04-15 NOTE — Progress Notes (Signed)
 [facility] at [facility] [address]  [place], [place] [address] [phone]   Interval Evaluation  Date of Service: 04/15/24 Patient Name: Joshua [name] Patient MRN: [id number] Patient DOB: [birth year] Provider: Arthea MARLA Manns, MD  Identifying Statement:  Joshua [name] is a 68 y.o. male with Malignant neoplasm metastatic to brain [facility])  Seizure disorder [facility])    Primary Cancer:  Oncologic History: Oncology History  Squamous cell carcinoma of right lung (HCC)  09/18/2020 Imaging   PET scan showed right upper lobe nodule maximal SUV 11.5.  Hypermetabolic cluster right paratracheal adenopathy with maximum SUV up to 12.  No findings of metastatic disease to the neck/abdomen/pelvis or skeleton.   09/20/2020 Initial Diagnosis   Squamous cell carcinoma of right lung (HCC) NGS showed PD-L1 TPS 95%, LRP1B S997fs, PIK3CA E545K, RB1 c1422-2A>T, RIT1 T83_ A84del, STK11 P281fs, TPS53 R248W, TMB 19.36mut/mb, MS stable.    09/20/2020 Cancer Staging   Staging form: Lung, AJCC 8th Edition - Clinical stage from 09/20/2020: Stage IV (cT1b, cN2, cM1) - Signed by [name], MD on 12/13/2021   10/03/2020 Imaging   MRI brain with and without contrast showed no evidence of intracranial metastatic disease. Well-defined round lesion within the left parotid tail measuring approximately 2.3 x 1.8 x 1.8 cm   10/09/2020 - 11/27/2020 Radiation Therapy   concurrent chemoradiation   10/10/2020 - 10/31/2020 Chemotherapy   10/10/2020 concurrent chemoradiation.  carboplatin  AUC of 2 and Taxol  45 mg/m2 x 4.  Additional chemotherapy was held due to thrombocytopenia, neutropenia       12/31/2020 - 03/21/2022 Chemotherapy   Maintenance  Durvalumab  q14d      12/31/2020 -  Chemotherapy   Patient is on Treatment Plan : LUNG Durvalumab  (10) q14d     03/28/2021 Imaging   CT without contrast showed new linear right perihilar consolidation with associated traction bronchiectasis.   Likely postradiation changes.  Additional new peribronchial vascular/nodular groundglass opacities with areas of consolidation or seen primarily in the right upper and lower lobes, more distant from expected radiation fields. 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  Per my discussion with pulmonology Dr. 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     05/09/2021 Imaging   Repeat CT chest without contrast showed evolving radiation changes in the right perihilar region likely accounting for interval increase to thickening of the posterior wall of the right upper lobe bronchus.  The more peripheral right lung groundglass opacities seen on most recent study have generally improved with exception of one peripherally right upper lobe which appears new.  These are likely inflammatory.  No findings highly suspicious for local recurrence.   05/30/2021 - 01/10/2022 Chemotherapy   Durvalumab  q14d     10/07/2021 Imaging   MRI brain with and without contrast showed 2 contrast-enhancing lesions, most consistent with metastatic disease.  Largest lesion is in the left frontal lobe and measures up to 1.3 cm with a large amount of surrounding edema and 9 mm rightward midline shift.  The other enhancing lesion was 4 mm in the right occipital lobe with a small amount of surrounding edema.  No acute or chronic hemorrhage.   10/07/2021 Progression   Headache after colonoscopy, progressive difficulty using her right arm.  Progressively worsening of speech. Stage IV lung cancer with brain metastasis.  started on Dexamethasone .  He was seen by neurosurgery and radiation  oncology.  Recommendation is to proceed with SRS.  Patient was discharged with dexamethasone    10/16/2021 Imaging   PET scan showed postradiation changes in the right supra hilar lung without evidence of local recurrence.  No  evidence of lung cancer recurrence or metastatic disease.  Single focus of intense metabolic activity associated with posterior left 11th rib likely secondary to recent fall/posttraumatic metabolic changes.  Frontal  uptake in the left frontal lobe corresponding to the known metastatic lesion in the brain   10/29/2021 -  Radiation Therapy   Brain Radiation [facility]   12/10/2021 Imaging   Brain MRI: Continued interval decrease in size of a left frontal lobe metastasis, now measuring 10 mm. Moderate surrounding edema has also decreased.  3-4 mm metastasis within the right occipital lobe, not significantly changed in size. There is now a small focus of central non enhancement within this lesion. Increased edema. 3 mm enhancing metastasis within the high left parietal lobe. In retrospect, this was likely present as a subtle punctate focus of enhancement on the prior brain MRI of 10/22/2021.   01/18/2022 Imaging   CT chest abdomen pelvis w contrast 1. Stable post treatment changes in the right upper lobe medially with dense radiation fibrosis but no findings to suggest local recurrent tumor. 2. Significant progression of ill-defined ground-glass opacity, interstitial thickening and airspace nodularity in the right upper lobe and upper aspect of the right lower lobe. Findings could reflect an inflammatory or atypical infectious process, drug induced pneumonitis or interstitial spread of tumor.3. No mediastinal or hilar mass or adenopathy. 4. No findings for abdominal/pelvic metastatic disease or osseous metastatic disease.   02/19/2022 Imaging   MRI brain w wo contrast showed Three metastatic deposits in the brain appear smaller. Decreased edema in the left parietal lobe. No new lesions    04/04/2022 Imaging   CT chest w contrast 1. Stable radiation changes in the right perihilar region without evidence of local recurrence. 2. Interval improvement in the patchy ground-glass opacities previously demonstrated in  both lungs, most consistent with a resolving infectious or inflammatory process (including immunotherapy-related pneumonitis). 3. No evidence of metastatic disease.   05/14/2022 Miscellaneous   He had a mechanical fall and fractured his left elbow. xrays showed a left elbow fracture  05/26/22 seen by orthopedic surgeon, cast applied.    05/27/2022 Imaging   MRI brain w wo contrast 1. All three known brain metastases have enlarged since August, with increasing regional edema. And there is a small new left anterior cerebellar 4-5 mm metastasis (was questionably punctate on the May MRI). This constellation could reflect a combination of True Progression (Left Cerebellar lesion) and pseudoprogression/radionecrosis (3 existing mets).    2. No significant intracranial mass effect. 3. Underlying moderate cerebral white matter signal changes.   05/30/2022 Imaging   CT chest abdomen pelvis w contrast  1. 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 There is a stable small right pleural effusion, but no definitive findings to suggest residual or recurrent disease on today's examination. 2. No signs of metastatic disease in the abdomen or pelvis.  3. Aortic atherosclerosis, in addition to two-vessel coronary artery disease. Please note that although the presence of coronary artery calcium  documents the presence of coronary artery disease, the severity of this disease and any potential stenosis cannot be assessed on this non-gated CT examination. Assessment for potential risk factor modification, dietary therapy or pharmacologic therapy may be  warranted, if clinically indicated. 4. Hepatic steatosis. 5. Colonic diverticulosis without evidence of acute diverticulitis at this time. 6. Incompletely imaged but apparently occluded vascular graft in the superior aspect of the right anterior  thigh. Referral to vascular specialist for further clinical evaluation should be considered if clinically appropriate. 7. Additional incidental findings, as above.   07/22/2022 Imaging   MRI brain  1. No new or enlarging lesions. No intracranial disease progression. 2. Decreased size of treated lesions of the left frontoparietal junction and left postcentral gyrus. 3. Unchanged lesions in the left cerebellum and right occipital lobe.   10/15/2022 Imaging   MRI brain w wo contrast showed Interval increase in size of multiple intraparenchymal lesions with worsened surrounding vasogenic edema. 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 No new lesions   11/05/2022 Imaging   MRI brain w wo contrast  1. Likely slight increase and right occipital and left cerebellar lesions. Otherwise, unchanged metastases, as above. No new lesions identified. 2. Partially imaged large probable lymph node in the left upper neck, apparent on prior PET CT.   11/13/2022 -  Radiation Therapy   Each of the sites were treated with SRS/SBRT/SRT-IMRT technique to 20 Gy in 1 fraction to each of the following sites: PTV_2_LCerebel_74mm PTV_3_RCerebel_76mm PTV_4_ROccip_42mm PTV_5_LParietal_16mm   11/27/2022 Imaging   CT chest abdomen pelvis w contrast showed  Improving bilateral scattered interstitial and ground-glass opacities. Small amount of residual areas in the extreme left lower lobe inferiorly. This specific area is slightly increased from previous but overall the amount of opacities is significantly improved. Recommend continued follow-up. No developing new mass lesion, fluid collection or lymph node enlargement. Stable posttreatment changes right perihilar in the lung. Fatty liver infiltration. Colonic diverticulosis. Improved tiny right pleural effusion   02/05/2023 Imaging   MRI brain with and without contrast Interval decrease in all of the  metastatic lesions, detailed above.No new lesions identified.    Imaging   CT chest abdomen pelvis w contrast showed  1. 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 2. Trace right pleural fluid or thickening, new or minimally increased. 3. Incidental findings, including: Aortic atherosclerosis (ICD10-I70.0), coronary artery atherosclerosis and emphysema (ICD10-J43.9). Hepatic steatosis.   06/25/2023 Imaging   CT chest abdomen pelvis w contrast showed 1. Unchanged post treatment/post radiation appearance of the right lung, with perihilar and suprahilar paramedian fibrosis and consolidation. 2. No evidence of lymphadenopathy or metastatic disease in the chest, abdomen, or pelvis. 3. Hepatic steatosis. 4. Coronary artery disease.   Aortic Atherosclerosis (ICD10-I70.0) and Emphysema (ICD10-J43.9).   08/26/2023 [facility] Admission   hospitalization due to was observed speech and jerking movements on right side despite being on dexamethasone . Continuous EEG suggest cortical dysfunction arising from left temporal region.  No seizure or elective form discharges were seen. Patient was discharged on dexamethasone  4 mg, Keppra , lamotrigine .    11/04/2023 -  Chemotherapy   Add Bevacizumab  5mg /kg to Durvalumab .     11/17/2023 - 11/27/2023 Radiation Therapy   Patient received radiation to brain for treatment of recurrence.   02/23/2024 Imaging   CT chest abdomen pelvis w contrast showed  CHEST:   1. Post RIGHT upper lobe resection. 2. No evidence of lung cancer recurrence. 3. No mediastinal adenopathy.   PELVIS:   1. No evidence of metastatic disease in the abdomen pelvis. 2. No skeletal metastasis.    CNS Oncologic History 10/29/21: SRS to left  frontal metastasis ([place]) 11/13/22: Salvage SRS x5 with Dr. Dewey 11/27/23: Re-treated progressive L parietal and L frontal 30gy/5 with concurrent avastin  [facility])  Interval  History: Joshua [name] presents today for follow up after recent MRI brain.  He is off avastin  right now, having lost dosed on 03/02/24.  Right arm and leg strength are unchanged overall at this time.  Still dosing Keppra  1000mg  BID, decadron  remains off currently.  No further seizure events.  Otherwise remains on Imfinzi  immunotherapy with Dr. Babara.  H+P (06/20/22) Patient presents today for evaluation for neurologic complaints.  He describes gradual onset of impaired speech output and right arm greater than right leg weakness.  This led to a fall and an injury to his left arm.  He was started on decadron  two weeks ago, this has led to considerable improvement in symptoms.  Today he feels at his prior baseline.  No issues tolerating the steroids.  He is due for durvalumab  infusion today with Dr. Babara.   Medications: Current Outpatient Medications on File Prior to Visit  Medication Sig Dispense Refill   aspirin  EC 81 MG tablet Take 81 mg by mouth daily. Swallow whole.     clopidogrel  (PLAVIX ) 75 MG tablet TAKE 1 TABLET BY MOUTH DAILY 100 tablet 2   levETIRAcetam  (KEPPRA ) 1000 MG tablet Take 1 tablet (1,000 mg total) by mouth 2 (two) times daily. 60 tablet 2   levothyroxine  (SYNTHROID ) 125 MCG tablet Take 1 tablet (125 mcg total) by mouth daily before breakfast. 90 tablet 0   ondansetron  (ZOFRAN ) 8 MG tablet Take 1 tablet (8 mg total) by mouth every 8 (eight) hours as needed for nausea or vomiting. 60 tablet 1   potassium chloride  SA (KLOR-CON  M) 20 MEQ tablet Take 1 tablet (20 mEq total) by mouth daily. 30 tablet 2   rosuvastatin  (CRESTOR ) 40 MG tablet Take 1 tablet (40 mg total) by mouth daily. 90 tablet 3   Current Facility-Administered Medications on File Prior to Visit  Medication Dose Route Frequency Provider Last Rate Last Admin   albuterol  (PROVENTIL ) (2.5 MG/3ML) 0.083% nebulizer solution 2.5 mg  2.5 mg Nebulization Once Tamea Dedra CROME, MD       heparin  lock flush 100 unit/mL  500  Units Intracatheter Once PRN [name], MD        Allergies: No Known Allergies Past Medical History:  Past Medical History:  Diagnosis Date   Allergy    Cancer (HCC)    lung   COPD (chronic obstructive pulmonary disease) (HCC)    Dyspnea    former smoker. 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   Malignant neoplasm metastatic to brain [facility])    Nausea with vomiting 02/01/2024   Peripheral vascular disease    Pre-diabetes    Sleep apnea    Past Surgical History:  Past Surgical History:  Procedure Laterality Date   ANTERIOR CERVICAL DECOMP/DISCECTOMY FUSION N/A 03/14/2020   Procedure: ANTERIOR CERVICAL DECOMPRESSION/DISCECTOMY FUSION 3 LEVELS C4-7;  Surgeon: Clois Fret, MD;  Location: [facility] ORS;  Service: Neurosurgery;  Laterality: N/A;   BACK SURGERY  2011   CENTRAL LINE INSERTION Right 09/10/2016   Procedure: CENTRAL LINE INSERTION;  Surgeon: Jama Cordella MATSU, MD;  Location: [facility] ORS;  Service: Vascular;  Laterality: Right;   CERVICAL FUSION     C4-c7 on Mar 14, 2020   COLONOSCOPY  2013 ?   COLONOSCOPY N/A 09/09/2021   Procedure:  COLONOSCOPY;  Surgeon: Onita Elspeth Sharper, DO;  Location: [facility] ENDOSCOPY;  Service: Gastroenterology;  Laterality: N/A;   CORONARY ANGIOPLASTY     ESOPHAGOGASTRODUODENOSCOPY N/A 09/09/2021   Procedure: ESOPHAGOGASTRODUODENOSCOPY (EGD);  Surgeon: Onita Elspeth Sharper, DO;  Location: [facility] ENDOSCOPY;  Service: Gastroenterology;  Laterality: N/A;   FEMORAL-POPLITEAL BYPASS GRAFT Right 09/10/2016   Procedure: BYPASS GRAFT FEMORAL-POPLITEAL ARTERY ( BELOW KNEE );  Surgeon: Jama Cordella MATSU, MD;  Location: [facility] ORS;  Service: Vascular;  Laterality: Right;   FEMORAL-TIBIAL BYPASS GRAFT Right 03/23/2020   Procedure: BYPASS GRAFT RIGHT FEMORAL- DISTAL TIBIAL ARTERY WITH DISTAL FLOW GRAFT;  Surgeon: Jama Cordella MATSU, MD;  Location: [facility] ORS;  Service: Vascular;  Laterality: Right;   LEFT HEART  CATH AND CORONARY ANGIOGRAPHY Left 05/16/2021   Procedure: LEFT HEART CATH AND CORONARY ANGIOGRAPHY;  Surgeon: Lawyer Bernardino Cough, MD;  Location: [facility] INVASIVE CV LAB;  Service: Cardiovascular;  Laterality: Left;   LOWER EXTREMITY ANGIOGRAPHY Right 11/18/2016   Procedure: Lower Extremity Angiography;  Surgeon: Cordella MATSU Jama, MD;  Location: [facility] INVASIVE CV LAB;  Service: Cardiovascular;  Laterality: Right;   LOWER EXTREMITY ANGIOGRAPHY Right 12/09/2016   Procedure: Lower Extremity Angiography;  Surgeon: Jama Cordella MATSU, MD;  Location: [facility] INVASIVE CV LAB;  Service: Cardiovascular;  Laterality: Right;   LOWER EXTREMITY ANGIOGRAPHY Right 11/15/2019   Procedure: LOWER EXTREMITY ANGIOGRAPHY;  Surgeon: Jama Cordella MATSU, MD;  Location: [facility] INVASIVE CV LAB;  Service: Cardiovascular;  Laterality: Right;   LOWER EXTREMITY ANGIOGRAPHY Right 12/01/2019   Procedure: Lower Extremity Angiography;  Surgeon: Marea Selinda RAMAN, MD;  Location: [facility] INVASIVE CV LAB;  Service: Cardiovascular;  Laterality: Right;   LOWER EXTREMITY ANGIOGRAPHY Right 12/02/2019   Procedure: LOWER EXTREMITY ANGIOGRAPHY;  Surgeon: Jama Cordella MATSU, MD;  Location: [facility] INVASIVE CV LAB;  Service: Cardiovascular;  Laterality: Right;   LOWER EXTREMITY ANGIOGRAPHY Right 03/23/2020   Procedure: Lower Extremity Angiography;  Surgeon: Jama Cordella MATSU, MD;  Location: [facility] INVASIVE CV LAB;  Service: Cardiovascular;  Laterality: Right;   LOWER EXTREMITY ANGIOGRAPHY Right 09/25/2021   Procedure: Lower Extremity Angiography;  Surgeon: Jama Cordella MATSU, MD;  Location: [facility] INVASIVE CV LAB;  Service: Cardiovascular;  Laterality: Right;   LOWER EXTREMITY ANGIOGRAPHY Right 09/26/2021   Procedure: Lower Extremity Angiography;  Surgeon: Marea Selinda RAMAN, MD;  Location: [facility] INVASIVE CV LAB;  Service: Cardiovascular;  Laterality: Right;   LOWER EXTREMITY ANGIOGRAPHY Right 04/08/2022   Procedure: Lower Extremity Angiography;  Surgeon: Jama Cordella MATSU, MD;   Location: [facility] INVASIVE CV LAB;  Service: Cardiovascular;  Laterality: Right;   LOWER EXTREMITY ANGIOGRAPHY Right 03/04/2024   Procedure: Lower Extremity Angiography;  Surgeon: Jama Cordella MATSU, MD;  Location: [facility] INVASIVE CV LAB;  Service: Cardiovascular;  Laterality: Right;   LOWER EXTREMITY ANGIOGRAPHY Right 03/25/2024   Procedure: Lower Extremity Angiography;  Surgeon: Jama Cordella MATSU, MD;  Location: [facility] INVASIVE CV LAB;  Service: Cardiovascular;  Laterality: Right;   LOWER EXTREMITY INTERVENTION  11/18/2016   Procedure: Lower Extremity Intervention;  Surgeon: Cordella MATSU Jama, MD;  Location: [facility] INVASIVE CV LAB;  Service: Cardiovascular;;   LOWER EXTREMITY INTERVENTION Right 03/04/2024   Procedure: LOWER EXTREMITY INTERVENTION;  Surgeon: Jama Cordella MATSU, MD;  Location: [facility] INVASIVE CV LAB;  Service: Cardiovascular;  Laterality: Right;   PERIPHERAL VASCULAR CATHETERIZATION Right 01/02/2015   Procedure: Lower Extremity Angiography;  Surgeon: Cordella MATSU Jama, MD;  Location: [facility] INVASIVE CV LAB;  Service: Cardiovascular;  Laterality: Right;   PERIPHERAL VASCULAR CATHETERIZATION Right 06/18/2015   Procedure:  Lower Extremity Angiography;  Surgeon: Selinda GORMAN Gu, MD;  Location: [facility] INVASIVE CV LAB;  Service: Cardiovascular;  Laterality: Right;   PERIPHERAL VASCULAR CATHETERIZATION  06/18/2015   Procedure: Lower Extremity Intervention;  Surgeon: Selinda GORMAN Gu, MD;  Location: [facility] INVASIVE CV LAB;  Service: Cardiovascular;;   PERIPHERAL VASCULAR CATHETERIZATION N/A 10/09/2015   Procedure: Abdominal Aortogram w/Lower Extremity;  Surgeon: Cordella KANDICE Shawl, MD;  Location: [facility] INVASIVE CV LAB;  Service: Cardiovascular;  Laterality: N/A;   PERIPHERAL VASCULAR CATHETERIZATION  10/09/2015   Procedure: Lower Extremity Intervention;  Surgeon: Cordella KANDICE Shawl, MD;  Location: [facility] INV[facility] LAB;  Service: Cardiovascular;;   PERIPHERAL VASCULAR CATHETERIZATION Right 10/10/2015   Procedure: Lower  Extremity Angiography;  Surgeon: Selinda GORMAN Gu, MD;  Location: [facility] INV[facility] LAB;  Service: Cardiovascular;  Laterality: Right;   PERIPHERAL VASCULAR CATHETERIZATION Right 10/10/2015   Procedure: Lower Extremity Intervention;  Surgeon: Selinda GORMAN Gu, MD;  Location: [facility] INV[facility];  Service: Cardiovascular;  Laterality: Right;   PERIPHERAL VASCULAR CATHETERIZATION Right 12/03/2015   Procedure: Lower Extremity Angiography;  Surgeon: Selinda GORMAN Gu, MD;  Location: [facility] IN[facility];  Service: Cardiovascular;  Laterality: Right;   PERIPHERAL VASCULAR CATHETERIZATION Right 12/04/2015   Procedure: Lower Extremity Angiography;  Surgeon: Selinda GORMAN Gu, MD;  Location: [facility];  Service: Cardiovascular;  Laterality: Right;   PERIPHERAL VASCULAR CATHETERIZATION  12/04/2015   Procedure: Lower Extremity Intervention;  Surgeon: Selinda GORMAN Gu, MD;  Location: [facility] INV[facility] LAB;  Service: Cardiovascular;;   PERIPHERAL VASCULAR CATHETERIZATION Right 06/30/2016   Procedure: Lower Extremity Angiography;  Surgeon: Selinda GORMAN Gu, MD;  Location: [facility] INV[facility] LAB;  Service: Cardiovascular;  Laterality: Right;   PERIPHERAL VASCULAR CATHETERIZATION Right 06/25/2016   Procedure: Lower Extremity Angiography;  Surgeon: Cordella KANDICE Shawl, MD;  Location: [facility] IN[facility];  Service: Cardiovascular;  Laterality: Right;   PERIPHERAL VASCULAR CATHETERIZATION Right 07/01/2016   Procedure: Lower Extremity Angiography;  Surgeon: Cordella KANDICE Shawl, MD;  Location: [facility] INV[facility] LAB;  Service: Cardiovascular;  Laterality: Right;   PERIPHERAL VASCULAR CATHETERIZATION  07/01/2016   Procedure: Lower Extremity Intervention;  Surgeon: Cordella KANDICE Shawl, MD;  Location: [facility] INV[facility] LAB;  Service: Cardiovascular;;   PERIPHERAL VASCULAR CATHETERIZATION Right 08/01/2016   Procedure: Lower Extremity Angiography;  Surgeon: Cordella KANDICE Shawl, MD;  Location: [facility] INV[facility]IVE CV LAB;  Service: Cardiovascular;  Laterality: Right;    PORTA CATH INSERTION N/A 10/05/2020   Procedure: PORTA CATH INSERTION;  Surgeon: Shawl Cordella KANDICE, MD;  Location: [facility] INVASIVE CV LAB;  Service: Cardiovascular;  Laterality: N/A;   stent placement in right leg Right    VIDEO BRONCHOSCOPY N/A 09/14/2020   Procedure: VIDEO BRONCHOSCOPY WITH FLUORO;  Surgeon: Tamea Dedra CROME, MD;  Location: [facility];  Service: Cardiopulmonary;  Laterality: N/A;   VIDEO BRONCHOSCOPY WITH ENDOBRONCHIAL ULTRASOUND N/A 09/14/2020   Procedure: VIDEO BRONCHOSCOPY WITH ENDOBRONCHIAL ULTRASOUND;  Surgeon: Tamea Dedra CROME, MD;  Location: [facility] ORS;  Service: Cardiopulmonary;  Laterality: N/A;   Social History:  Social History   Socioeconomic History   Marital status: Single    Spouse name: Not on file   Number of children: Not on file   Years of education: Not on file   Highest education level: Not on file  Occupational History   Occupation: unemployed  Tobacco Use   Smoking status: Former    Current packs/day: 0.00    Average packs/day: 2.0 packs/day for 38.0 years (76.0 ttl pk-yrs)  Types: Cigarettes    Start date: 12/19/1976    Quit date: 12/20/2014    Years since quitting: 9.3   Smokeless tobacco: Former   Tobacco comments:    quit smoking in 2017 most smoked 2   Vaping Use   Vaping status: Never Used  Substance and Sexual Activity   Alcohol  use: Not Currently   Drug use: No   Sexual activity: Yes  Other Topics Concern   Not on file  Social History Narrative   Not on file   Social Drivers of Health   Financial Resource Strain: Medium Risk (02/05/2024)   Overall Financial Resource Strain (CARDIA)    Difficulty of Paying Living Expenses: Somewhat hard  Food Insecurity: No Food Insecurity (03/25/2024)   Hunger Vital Sign    Worried About Running Out of Food in the Last Year: Never true    Ran Out of Food in the Last Year: Never true  Transportation Needs: No Transportation Needs (03/25/2024)   PRAPARE - Administrator, Civil Service  (Medical): No    Lack of Transportation (Non-Medical): No  Physical Activity: Inactive (02/05/2024)   Exercise Vital Sign    Days of Exercise per Week: 1 day    Minutes of Exercise per Session: 0 min  Stress: No Stress Concern Present (02/05/2024)   [employer] of Occupational Health - Occupational Stress Questionnaire    Feeling of Stress: Only a little  Social Connections: Socially Isolated (03/25/2024)   Social Connection and Isolation Panel    Frequency of Communication with Friends and Family: Three times a week    Frequency of Social Gatherings with Friends and Family: Once a week    Attends Religious Services: Never    Database administrator or Organizations: No    Attends Banker Meetings: Never    Marital Status: Never married  Intimate Partner Violence: Not At Risk (03/25/2024)   Humiliation, Afraid, Rape, and Kick questionnaire    Fear of Current or Ex-Partner: No    Emotionally Abused: No    Physically Abused: No    Sexually Abused: No   Family History:  Family History  Problem Relation Age of Onset   Diabetes Mother    Hypertension Mother    Heart murmur Mother    Leukemia Mother    Throat cancer Maternal Grandmother    Colon cancer Neg Hx    Breast cancer Neg Hx     Review of Systems: Constitutional: Doesn't report fevers, chills or abnormal weight loss Eyes: Doesn't report blurriness of vision Ears, nose, mouth, throat, and face: Doesn't report sore throat Respiratory: Doesn't report cough, dyspnea or wheezes Cardiovascular: Doesn't report palpitation, chest discomfort  Gastrointestinal:  Doesn't report nausea, constipation, diarrhea GU: Doesn't report incontinence Skin: Doesn't report skin rashes Neurological: Per HPI Musculoskeletal: Doesn't report joint pain Behavioral/Psych: Doesn't report anxiety  Physical Exam: Wt Readings from Last 3 Encounters:  04/12/24 184 lb 9.6 oz (83.7 kg)  03/28/24 189 lb 11.2 oz (86 kg)  03/25/24 188 lb  15 oz (85.7 kg)   Temp Readings from Last 3 Encounters:  04/12/24 (!) 96.1 F (35.6 C) (Tympanic)  04/12/24 97.6 F (36.4 C)  03/28/24 (!) 97.4 F (36.3 C) (Tympanic)   BP Readings from Last 3 Encounters:  04/12/24 134/75  04/12/24 122/82  03/28/24 134/86   Pulse Readings from Last 3 Encounters:  04/12/24 83  04/12/24 100  03/28/24 95     KPS: 70. General: Alert, cooperative, pleasant, in  no acute distress Head: Normal EENT: No conjunctival injection or scleral icterus.  Lungs: Resp effort normal Cardiac: Regular rate Abdomen: Non-distended abdomen Skin: normal Extremities: Left arm in sling  Neurologic Exam: Mental Status: Awake, alert, attentive to examiner. Oriented to self and environment. Language is fluent with intact comprehension.  Cranial Nerves: Visual acuity is grossly normal. Visual fields are full. Extra-ocular movements intact. No ptosis. Face is symmetric Motor: Tone and bulk are normal. Power is 4+/5 in right arm and leg, with some impairment in fine motor function in right hand. Reflexes are symmetric, no pathologic reflexes present.  Sensory: Intact to light touch Gait: Hemiparetic   Labs: I have reviewed the data as listed    Component Value Date/Time   NA 139 04/12/2024 0848   NA 138 06/02/2023 1049   K 3.3 (L) 04/12/2024 0848   CL 106 04/12/2024 0848   CO2 21 (L) 04/12/2024 0848   GLUCOSE 163 (H) 04/12/2024 0848   BUN 8 04/12/2024 0848   BUN 18 06/02/2023 1049   BUN 16 07/03/2014 1205   CREATININE 1.08 04/12/2024 0848   CREATININE 0.93 02/08/2024 1309   CREATININE 1.34 (H) 07/03/2014 1205   CALCIUM  8.7 (L) 04/12/2024 0848   PROT 6.2 (L) 04/12/2024 0848   PROT 7.0 04/16/2020 1525   ALBUMIN  3.3 (L) 04/12/2024 0848   ALBUMIN  4.3 04/16/2020 1525   AST 32 04/12/2024 0848   ALT 8 04/12/2024 0848   ALKPHOS 75 04/12/2024 0848   BILITOT 0.6 04/12/2024 0848   BILITOT 0.4 04/16/2020 1525   GFRNONAA >60 04/12/2024 0848   GFRNONAA >60  02/08/2024 1309   GFRNONAA 59 (L) 07/03/2014 1205   GFRAA >60 03/30/2020 0536   GFRAA >60 07/03/2014 1205   Lab Results  Component Value Date   WBC 4.6 04/12/2024   NEUTROABS 2.8 04/12/2024   HGB 11.1 (L) 04/12/2024   HCT 35.2 (L) 04/12/2024   MCV 80.0 04/12/2024   PLT 285 04/12/2024    Imaging:  [facility] Clinician Interpretation: I have personally reviewed the CNS images as listed.  My interpretation, in the context of the patient's clinical presentation, is stable disease   MR BRAIN W WO CONTRAST Result Date: 04/11/2024 CLINICAL DATA:  Brain/CNS neoplasm, assess treatment response EXAM: MRI HEAD WITHOUT AND WITH CONTRAST TECHNIQUE: Multiplanar, multiecho pulse sequences of the brain and surrounding structures were obtained without and with intravenous contrast. CONTRAST:  9 mL of Vueway  COMPARISON:  MRI head January 04, 2024. FINDINGS: Brain: Slight increase in size of a centrally necrotic 9 mm lesion in the right occipital lobe. Similar versus slightly decreased necrotic left parietal lesion which measures approximately 2.1 x 1.9 cm. Similar versus slightly improved/decreased 1.1 x 1.1 cm lesion in the left frontal operculum. Unchanged punctate focus of enhancement in the left cerebellum (series 14, image 42). Edema surrounding these lesions is not substantially changed. No progressive mass effect. No midline shift. No evidence of acute infarct, acute hemorrhage, or hydrocephalus. Vascular: Normal flow voids. Skull and upper cervical spine: Normal marrow signal. Sinuses/Orbits: Clear sinuses.  No acute findings. IMPRESSION: 1. 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 This could represent treatment related change or progression. Recommend attention on follow-up. 2. 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 3. Unchanged punctate lesion in the left cerebellum, most likely a vessel but  warranting attention on follow-up. 4. 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 Recommend ENT follow-up if not previously evaluated. Electronically Signed   By: Gilmore GORMAN Molt M.D.   On: 04/11/2024 13:00   PERIPHERAL VASCULAR CATHETERIZATION Result Date: 03/25/2024 See surgical note for result.   Assessment/Plan Malignant neoplasm metastatic to brain [facility])  Seizure disorder (HCC)  Joshua [name] is clinically stable today.  MRI brain demonstrates stable findings with regards to 2 recent sites of re-treatment within left hemisphere.  Very subtle increase in R occipital lesion is of uncertain etiology.     Recommend continued imaging surveillance only at this time.  He may remain off avastin , also should remain off steroids for now if tolerated.  We appreciate the opportunity to participate in the care of Joshua [name].   We ask that Joshua [name] return to clinic in 3 months following next brain MRI, or sooner as needed.  All questions were answered. The patient knows to call the clinic with any problems, questions or concerns. No barriers to learning were detected.  The total time spent in the encounter was 40 minutes and more than 50% was on counseling and review of test results   Arthea MARLA Manns, MD Medical Director of Neuro-Oncology [facility] 04/15/24 10:50 AM

## 2024-04-15 NOTE — Telephone Encounter (Signed)
 Ondansetron  8g has been approved from 04/13/24 to 07/20/24

## 2024-04-17 ENCOUNTER — Other Ambulatory Visit: Payer: Self-pay

## 2024-04-18 ENCOUNTER — Inpatient Hospital Stay

## 2024-04-20 ENCOUNTER — Other Ambulatory Visit (INDEPENDENT_AMBULATORY_CARE_PROVIDER_SITE_OTHER): Payer: Self-pay | Admitting: Vascular Surgery

## 2024-04-20 DIAGNOSIS — Z9889 Other specified postprocedural states: Secondary | ICD-10-CM

## 2024-04-21 ENCOUNTER — Ambulatory Visit (INDEPENDENT_AMBULATORY_CARE_PROVIDER_SITE_OTHER)

## 2024-04-21 ENCOUNTER — Ambulatory Visit (INDEPENDENT_AMBULATORY_CARE_PROVIDER_SITE_OTHER): Admitting: Vascular Surgery

## 2024-04-21 DIAGNOSIS — I739 Peripheral vascular disease, unspecified: Secondary | ICD-10-CM

## 2024-04-21 DIAGNOSIS — Z9889 Other specified postprocedural states: Secondary | ICD-10-CM | POA: Diagnosis not present

## 2024-04-22 ENCOUNTER — Other Ambulatory Visit: Payer: Self-pay

## 2024-04-22 LAB — VAS US ABI WITH/WO TBI
Left ABI: 1.12
Right ABI: 1.13

## 2024-04-25 ENCOUNTER — Inpatient Hospital Stay

## 2024-04-25 ENCOUNTER — Inpatient Hospital Stay: Attending: Oncology

## 2024-04-25 ENCOUNTER — Inpatient Hospital Stay (HOSPITAL_BASED_OUTPATIENT_CLINIC_OR_DEPARTMENT_OTHER): Admitting: Oncology

## 2024-04-25 ENCOUNTER — Encounter: Payer: Self-pay | Admitting: Oncology

## 2024-04-25 VITALS — BP 138/91 | HR 100 | Temp 97.4°F | Resp 20 | Wt 183.8 lb

## 2024-04-25 DIAGNOSIS — Z5112 Encounter for antineoplastic immunotherapy: Secondary | ICD-10-CM | POA: Diagnosis not present

## 2024-04-25 DIAGNOSIS — E876 Hypokalemia: Secondary | ICD-10-CM | POA: Diagnosis not present

## 2024-04-25 DIAGNOSIS — C3411 Malignant neoplasm of upper lobe, right bronchus or lung: Secondary | ICD-10-CM | POA: Diagnosis not present

## 2024-04-25 DIAGNOSIS — C3491 Malignant neoplasm of unspecified part of right bronchus or lung: Secondary | ICD-10-CM

## 2024-04-25 DIAGNOSIS — Z79899 Other long term (current) drug therapy: Secondary | ICD-10-CM | POA: Insufficient documentation

## 2024-04-25 DIAGNOSIS — E032 Hypothyroidism due to medicaments and other exogenous substances: Secondary | ICD-10-CM

## 2024-04-25 DIAGNOSIS — Z79620 Long term (current) use of immunosuppressive biologic: Secondary | ICD-10-CM | POA: Insufficient documentation

## 2024-04-25 DIAGNOSIS — R569 Unspecified convulsions: Secondary | ICD-10-CM | POA: Diagnosis not present

## 2024-04-25 DIAGNOSIS — C7931 Secondary malignant neoplasm of brain: Secondary | ICD-10-CM

## 2024-04-25 LAB — CBC WITH DIFFERENTIAL/PLATELET
Abs Immature Granulocytes: 0.01 K/uL (ref 0.00–0.07)
Basophils Absolute: 0.1 K/uL (ref 0.0–0.1)
Basophils Relative: 2 %
Eosinophils Absolute: 0.3 K/uL (ref 0.0–0.5)
Eosinophils Relative: 7 %
HCT: 35.3 % — ABNORMAL LOW (ref 39.0–52.0)
Hemoglobin: 11.2 g/dL — ABNORMAL LOW (ref 13.0–17.0)
Immature Granulocytes: 0 %
Lymphocytes Relative: 23 %
Lymphs Abs: 1 K/uL (ref 0.7–4.0)
MCH: 24.8 pg — ABNORMAL LOW (ref 26.0–34.0)
MCHC: 31.7 g/dL (ref 30.0–36.0)
MCV: 78.1 fL — ABNORMAL LOW (ref 80.0–100.0)
Monocytes Absolute: 0.5 K/uL (ref 0.1–1.0)
Monocytes Relative: 11 %
Neutro Abs: 2.6 K/uL (ref 1.7–7.7)
Neutrophils Relative %: 57 %
Platelets: 281 K/uL (ref 150–400)
RBC: 4.52 MIL/uL (ref 4.22–5.81)
RDW: 15.8 % — ABNORMAL HIGH (ref 11.5–15.5)
WBC: 4.5 K/uL (ref 4.0–10.5)
nRBC: 0 % (ref 0.0–0.2)

## 2024-04-25 LAB — COMPREHENSIVE METABOLIC PANEL WITH GFR
ALT: 13 U/L (ref 0–44)
AST: 30 U/L (ref 15–41)
Albumin: 3.6 g/dL (ref 3.5–5.0)
Alkaline Phosphatase: 69 U/L (ref 38–126)
Anion gap: 9 (ref 5–15)
BUN: 7 mg/dL — ABNORMAL LOW (ref 8–23)
CO2: 24 mmol/L (ref 22–32)
Calcium: 9.3 mg/dL (ref 8.9–10.3)
Chloride: 107 mmol/L (ref 98–111)
Creatinine, Ser: 0.87 mg/dL (ref 0.61–1.24)
GFR, Estimated: >60 mL/min (ref >60)
Glucose, Bld: 118 mg/dL — ABNORMAL HIGH (ref 70–99)
Potassium: 3.8 mmol/L (ref 3.5–5.1)
Sodium: 140 mmol/L (ref 135–145)
Total Bilirubin: 0.6 mg/dL (ref 0.0–1.2)
Total Protein: 6.7 g/dL (ref 6.5–8.1)

## 2024-04-25 MED ORDER — SODIUM CHLORIDE 0.9 % IV SOLN
10.0000 mg/kg | Freq: Once | INTRAVENOUS | Status: AC
  Administered 2024-04-25: 860 mg via INTRAVENOUS
  Filled 2024-04-25: qty 7.2

## 2024-04-25 MED ORDER — SODIUM CHLORIDE 0.9 % IV SOLN
Freq: Once | INTRAVENOUS | Status: AC
  Filled 2024-04-25: qty 250

## 2024-04-25 NOTE — Assessment & Plan Note (Signed)
Durvalumab treatment as planned.  

## 2024-04-25 NOTE — Assessment & Plan Note (Signed)
 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  Labs are reviewed and discussed with patient. Proceed with Durvalumab 

## 2024-04-25 NOTE — Progress Notes (Signed)
 Hematology/Oncology Progress note [phone] [phone]    CHIEF COMPLAINTS/REASON FOR VISIT:  Follow up for lung cancer  ASSESSMENT & PLAN:   Cancer Staging  Squamous cell carcinoma of right lung (HCC) Staging form: Lung, AJCC 8th Edition - Clinical stage from 09/20/2020: Stage IV (cT1b, cN2, cM1) - Signed by [name], MD on 12/13/2021   Squamous cell carcinoma of right lung (HCC) Durvalumab  was previously held due to pneumonitis.  Labs are reviewed and discussed with patient. Proceed with Durvalumab     Encounter for antineoplastic immunotherapy Durvalumab  treatment as planned.   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 He is asymptomatic.  Follow up  with [name].   Seizure (HCC) Follow up with [name].  Continue keppra      Follow up  2 weeks lab MD Durvalumab   All questions were answered. The patient knows to call the clinic with any problems, questions or concerns.  Call Babara, MD, PhD [facility] Hematology Oncology 04/25/2024       HISTORY OF PRESENTING ILLNESS:  Joshua [name] is a 68 y.o. male who has above history reviewed by me today presents for follow up visit for Stage IV lung squamous cell carcinoma. Oncology History  Squamous cell carcinoma of right lung (HCC)  09/18/2020 Imaging   PET scan showed right upper lobe nodule maximal SUV 11.5.  Hypermetabolic cluster right paratracheal adenopathy with maximum SUV up to 12.  No findings of metastatic disease to the neck/abdomen/pelvis or skeleton.   09/20/2020 Initial Diagnosis   Squamous cell carcinoma of right lung (HCC) NGS showed PD-L1 TPS 95%, LRP1B S997fs, PIK3CA E545K, RB1 c1422-2A>T, RIT1 T83_ A84del, STK11 P281fs, TPS53 R248W, TMB 19.30mut/mb, MS stable.     09/20/2020 Cancer Staging   Staging form: Lung, AJCC 8th Edition - Clinical stage from 09/20/2020: Stage IV (cT1b, cN2, cM1) - Signed by [name], MD on 12/13/2021   10/03/2020 Imaging   MRI brain with and without contrast showed no evidence of intracranial metastatic disease. Well-defined round lesion within the left parotid tail measuring approximately 2.3 x 1.8 x 1.8 cm   10/09/2020 - 11/27/2020 Radiation Therapy   concurrent chemoradiation   10/10/2020 - 10/31/2020 Chemotherapy   10/10/2020 concurrent chemoradiation.  carboplatin  AUC of 2 and Taxol  45 mg/m2 x 4.  Additional chemotherapy was held due to thrombocytopenia, neutropenia       12/31/2020 - 03/21/2022 Chemotherapy   Maintenance  Durvalumab  q14d      12/31/2020 -  Chemotherapy   Patient is on Treatment Plan : LUNG Durvalumab  (10) q14d     03/28/2021 Imaging   CT without contrast showed new linear right perihilar consolidation with associated traction bronchiectasis.  Likely postradiation changes.  Additional new peribronchial vascular/nodular groundglass opacities with areas of consolidation or seen primarily in the right upper and lower lobes, more distant from expected radiation fields. 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  Per my discussion with pulmonology Dr. 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     05/09/2021 Imaging   Repeat CT chest without contrast showed evolving radiation changes in the right perihilar region likely accounting for interval increase to thickening of the posterior wall of the right upper lobe bronchus.  The more peripheral right lung groundglass opacities seen on most recent study have generally improved with exception of one peripherally right upper lobe which appears new.  These are likely inflammatory.  No findings highly suspicious for local recurrence.   05/30/2021  - 01/10/2022 Chemotherapy   Durvalumab  q14d     10/07/2021 Imaging   MRI brain with and without contrast showed 2 contrast-enhancing lesions, most consistent with metastatic disease.  Largest lesion is in the left frontal lobe and measures up to 1.3 cm with a large amount of surrounding edema and 9 mm rightward midline shift.  The other enhancing lesion was 4 mm in the right occipital lobe with a small amount of surrounding edema.  No acute or chronic hemorrhage.   10/07/2021 Progression   Headache after colonoscopy, progressive difficulty using her right arm.  Progressively worsening of speech. Stage IV lung cancer with brain metastasis.  started on Dexamethasone .  He was seen by neurosurgery and radiation oncology.  Recommendation is to proceed with SRS.  Patient was discharged with dexamethasone    10/16/2021 Imaging   PET scan showed postradiation changes in the right supra hilar lung without evidence of local recurrence.  No evidence of lung cancer recurrence or metastatic disease.  Single focus of intense metabolic activity associated with posterior left 11th rib likely secondary to recent fall/posttraumatic metabolic changes.  Frontal  uptake in the left frontal lobe corresponding to the known metastatic lesion in the brain   10/29/2021 -  Radiation Therapy   Brain Radiation [facility]   12/10/2021 Imaging   Brain MRI: Continued interval decrease in size of a left frontal lobe metastasis, now measuring 10 mm. Moderate surrounding edema has also decreased.  3-4 mm metastasis within the right occipital lobe, not significantly changed in size. There is now a small focus of central non enhancement within this lesion. Increased edema. 3 mm enhancing metastasis within the high left parietal lobe. In retrospect, this was likely present as a subtle punctate focus of enhancement on the prior brain MRI of 10/22/2021.   01/18/2022 Imaging   CT chest abdomen pelvis w contrast 1. Stable post treatment changes in the  right upper lobe medially with dense radiation fibrosis but no findings to suggest local recurrent tumor. 2. Significant progression of ill-defined ground-glass opacity, interstitial thickening and airspace nodularity in the right upper lobe and upper aspect of the right lower lobe. Findings could reflect an inflammatory or atypical infectious process, drug induced pneumonitis or interstitial spread of tumor.3. No mediastinal or hilar mass or adenopathy. 4. No findings for abdominal/pelvic metastatic disease or osseous metastatic disease.   02/19/2022 Imaging   MRI brain w wo contrast showed Three metastatic deposits in the brain appear smaller. Decreased edema in the left parietal lobe. No new lesions    04/04/2022 Imaging   CT chest w contrast 1. Stable radiation changes in the right perihilar region without evidence of local recurrence. 2. Interval improvement in the patchy ground-glass opacities previously demonstrated in both lungs, most consistent with a resolving infectious or inflammatory process (including immunotherapy-related pneumonitis). 3. No evidence of metastatic disease.   05/14/2022 Miscellaneous   He had a mechanical fall and fractured his left elbow. xrays showed a left elbow fracture  05/26/22 seen by orthopedic surgeon, cast applied.    05/27/2022 Imaging   MRI brain w wo contrast 1. All three known brain metastases have enlarged since August, with increasing regional edema. And there is a small new left anterior cerebellar 4-5 mm metastasis (  was questionably punctate on the May MRI). This constellation could reflect a combination of True Progression (Left Cerebellar lesion) and pseudoprogression/radionecrosis (3 existing mets).    2. No significant intracranial mass effect. 3. Underlying moderate cerebral white matter signal changes.   05/30/2022 Imaging   CT chest abdomen pelvis w contrast  1. Stable examination demonstrating chronic postradiation mass-like fibrosis in  the right lung with resolving postradiation pneumonitis in the adjacent lung parenchyma. There is a stable small right pleural effusion, but no definitive findings to suggest residual or recurrent disease on today's examination. 2. No signs of metastatic disease in the abdomen or pelvis.  3. Aortic atherosclerosis, in addition to two-vessel coronary artery disease. Please note that although the presence of coronary artery calcium  documents the presence of coronary artery disease, the severity of this disease and any potential stenosis cannot be assessed on this non-gated CT examination. Assessment for potential risk factor modification, dietary therapy or pharmacologic therapy may be warranted, if clinically indicated. 4. Hepatic steatosis. 5. Colonic diverticulosis without evidence of acute diverticulitis at this time. 6. Incompletely imaged but apparently occluded vascular graft in the superior aspect of the right anterior thigh. Referral to vascular specialist for further clinical evaluation should be considered if clinically appropriate. 7. Additional incidental findings, as above.   07/22/2022 Imaging   MRI brain  1. No new or enlarging lesions. No intracranial disease progression. 2. Decreased size of treated lesions of the left frontoparietal junction and left postcentral gyrus. 3. Unchanged lesions in the left cerebellum and right occipital lobe.   10/15/2022 Imaging   MRI brain w wo contrast showed Interval increase in size of multiple intraparenchymal lesions with worsened surrounding vasogenic edema. 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 No new lesions   11/05/2022 Imaging   MRI brain w wo contrast  1. Likely slight increase and right occipital and left cerebellar lesions. Otherwise, unchanged metastases, as above. No new lesions identified. 2. Partially imaged large probable lymph node in the left upper  neck, apparent on prior PET CT.   11/13/2022 -  Radiation Therapy   Each of the sites were treated with SRS/SBRT/SRT-IMRT technique to 20 Gy in 1 fraction to each of the following sites: PTV_2_LCerebel_79mm PTV_3_RCerebel_90mm PTV_4_ROccip_80mm PTV_5_LParietal_97mm   11/27/2022 Imaging   CT chest abdomen pelvis w contrast showed  Improving bilateral scattered interstitial and ground-glass opacities. Small amount of residual areas in the extreme left lower lobe inferiorly. This specific area is slightly increased from previous but overall the amount of opacities is significantly improved. Recommend continued follow-up. No developing new mass lesion, fluid collection or lymph node enlargement. Stable posttreatment changes right perihilar in the lung. Fatty liver infiltration. Colonic diverticulosis. Improved tiny right pleural effusion   02/05/2023 Imaging   MRI brain with and without contrast Interval decrease in all of the metastatic lesions, detailed above.No new lesions identified.    Imaging   CT chest abdomen pelvis w contrast showed  1. 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 2. Trace right pleural fluid or thickening, new or minimally increased. 3. Incidental findings, including: Aortic atherosclerosis (ICD10-I70.0), coronary artery atherosclerosis and emphysema (ICD10-J43.9). Hepatic steatosis.   06/25/2023 Imaging   CT chest abdomen pelvis w contrast showed 1. Unchanged post treatment/post radiation appearance of the right lung, with perihilar and suprahilar paramedian fibrosis and consolidation. 2. No evidence of lymphadenopathy or metastatic disease in the chest, abdomen, or pelvis.  3. Hepatic steatosis. 4. Coronary artery disease.   Aortic Atherosclerosis (ICD10-I70.0) and Emphysema (ICD10-J43.9).   08/26/2023 [facility] Admission   hospitalization due to was observed speech and jerking movements on right  side despite being on dexamethasone . Continuous EEG suggest cortical dysfunction arising from left temporal region.  No seizure or elective form discharges were seen. Patient was discharged on dexamethasone  4 mg, Keppra , lamotrigine .    11/04/2023 -  Chemotherapy   Add Bevacizumab  5mg /kg to Durvalumab .     11/17/2023 - 11/27/2023 Radiation Therapy   Patient received radiation to brain for treatment of recurrence.   02/23/2024 Imaging   CT chest abdomen pelvis w contrast showed  CHEST:   1. Post RIGHT upper lobe resection. 2. No evidence of lung cancer recurrence. 3. No mediastinal adenopathy.   PELVIS:   1. No evidence of metastatic disease in the abdomen pelvis. 2. No skeletal metastasis.     INTERVAL HISTORY Joshua [name] is a 67 y.o. male who has above history reviewed by me today presents for follow up visit for right lung RCC.  chronic SOB,not worse.  Right lower extremity wound has healed well.  Peripheral artery disease,Patient is on aspirin  and Plavix . He has nausea intermittently.  He took Compazine  10 mg as needed which did not help much.  Has decreased appetite and lost weight.   Review of Systems  Constitutional:  Positive for fatigue. Negative for appetite change, chills, fever and unexpected weight change.  HENT:   Negative for hearing loss and voice change.   Eyes:  Negative for eye problems and icterus.  Respiratory:  Negative for chest tightness, cough and shortness of breath.   Cardiovascular:  Positive for leg swelling. Negative for chest pain.  Gastrointestinal:  Positive for nausea. Negative for abdominal distention, abdominal pain and vomiting.  Endocrine: Negative for hot flashes.  Genitourinary:  Negative for difficulty urinating, dysuria and frequency.   Musculoskeletal:  Positive for gait problem. Negative for arthralgias.  Skin:  Negative for itching and rash.  Neurological:  Positive for gait problem. Negative for extremity weakness, headaches,  numbness and speech difficulty.  Hematological:  Negative for adenopathy. Bruises/bleeds easily.  Psychiatric/Behavioral:  Negative for confusion.       MEDICAL HISTORY:  Past Medical History:  Diagnosis Date   Allergy    Cancer (HCC)    lung   COPD (chronic obstructive pulmonary disease) (HCC)    Dyspnea    former smoker. 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   Malignant neoplasm metastatic to brain [facility])    Nausea with vomiting 02/01/2024   Peripheral vascular disease    Pre-diabetes    Sleep apnea     SURGICAL HISTORY: Past Surgical History:  Procedure Laterality Date   ANTERIOR CERVICAL DECOMP/DISCECTOMY FUSION N/A 03/14/2020   Procedure: ANTERIOR CERVICAL DECOMPRESSION/DISCECTOMY FUSION 3 LEVELS C4-7;  Surgeon: Clois Fret, MD;  Location: [facility] ORS;  Service: Neurosurgery;  Laterality: N/A;   BACK SURGERY  2011   CENTRAL LINE INSERTION Right 09/10/2016   Procedure: CENTRAL LINE INSERTION;  Surgeon: Jama Cordella MATSU, MD;  Location: [facility] ORS;  Service: Vascular;  Laterality: Right;   CERVICAL FUSION     C4-c7 on Mar 14, 2020   COLONOSCOPY  2013 ?   COLONOSCOPY N/A 09/09/2021   Procedure: COLONOSCOPY;  Surgeon: Onita Elspeth Sharper, DO;  Location: [facility] ENDOSCOPY;  Service: Gastroenterology;  Laterality: N/A;   CORONARY ANGIOPLASTY  ESOPHAGOGASTRODUODENOSCOPY N/A 09/09/2021   Procedure: ESOPHAGOGASTRODUODENOSCOPY (EGD);  Surgeon: Onita Elspeth Sharper, DO;  Location: [facility] END[facility];  Service: Gastroenterology;  Laterality: N/A;   FEMORAL-POPLITEAL BYPASS GRAFT Right 09/10/2016   Procedure: BYPASS GRAFT FEMORAL-POPLITEAL ARTERY ( BELOW KNEE );  Surgeon: Jama Cordella MATSU, MD;  Location: [facility] ORS;  Service: Vascular;  Laterality: Right;   FEMORAL-TIBIAL BYPASS GRAFT Right 03/23/2020   Procedure: BYPASS GRAFT RIGHT FEMORAL- DISTAL TIBIAL ARTERY WITH DISTAL FLOW GRAFT;  Surgeon: Jama Cordella MATSU, MD;   Location: [facility] ORS;  Service: Vascular;  Laterality: Right;   LEFT HEART CATH AND CORONARY ANGIOGRAPHY Left 05/16/2021   Procedure: LEFT HEART CATH AND CORONARY ANGIOGRAPHY;  Surgeon: Lawyer Bernardino Cough, MD;  Location: [facility] INVASIVE CV LAB;  Service: Cardiovascular;  Laterality: Left;   LOWER EXTREMITY ANGIOGRAPHY Right 11/18/2016   Procedure: Lower Extremity Angiography;  Surgeon: Cordella MATSU Jama, MD;  Location: [facility] INVASIVE CV LAB;  Service: Cardiovascular;  Laterality: Right;   LOWER EXTREMITY ANGIOGRAPHY Right 12/09/2016   Procedure: Lower Extremity Angiography;  Surgeon: Jama Cordella MATSU, MD;  Location: [facility] INVASIVE CV LAB;  Service: Cardiovascular;  Laterality: Right;   LOWER EXTREMITY ANGIOGRAPHY Right 11/15/2019   Procedure: LOWER EXTREMITY ANGIOGRAPHY;  Surgeon: Jama Cordella MATSU, MD;  Location: [facility] INVASIVE CV LAB;  Service: Cardiovascular;  Laterality: Right;   LOWER EXTREMITY ANGIOGRAPHY Right 12/01/2019   Procedure: Lower Extremity Angiography;  Surgeon: Marea Selinda RAMAN, MD;  Location: [facility] INVASIVE CV LAB;  Service: Cardiovascular;  Laterality: Right;   LOWER EXTREMITY ANGIOGRAPHY Right 12/02/2019   Procedure: LOWER EXTREMITY ANGIOGRAPHY;  Surgeon: Jama Cordella MATSU, MD;  Location: [facility] INVASIVE CV LAB;  Service: Cardiovascular;  Laterality: Right;   LOWER EXTREMITY ANGIOGRAPHY Right 03/23/2020   Procedure: Lower Extremity Angiography;  Surgeon: Jama Cordella MATSU, MD;  Location: [facility] INVASIVE CV LAB;  Service: Cardiovascular;  Laterality: Right;   LOWER EXTREMITY ANGIOGRAPHY Right 09/25/2021   Procedure: Lower Extremity Angiography;  Surgeon: Jama Cordella MATSU, MD;  Location: [facility] INVASIVE CV LAB;  Service: Cardiovascular;  Laterality: Right;   LOWER EXTREMITY ANGIOGRAPHY Right 09/26/2021   Procedure: Lower Extremity Angiography;  Surgeon: Marea Selinda RAMAN, MD;  Location: [facility] INV[facility] CV LAB;  Service: Cardiovascular;  Laterality: Right;   LOWER EXTREMITY ANGIOGRAPHY Right 04/08/2022    Procedure: Lower Extremity Angiography;  Surgeon: Jama Cordella MATSU, MD;  Location: [facility] INVASIVE CV LAB;  Service: Cardiovascular;  Laterality: Right;   LOWER EXTREMITY ANGIOGRAPHY Right 03/04/2024   Procedure: Lower Extremity Angiography;  Surgeon: Jama Cordella MATSU, MD;  Location: [facility] INV[facility] LAB;  Service: Cardiovascular;  Laterality: Right;   LOWER EXTREMITY ANGIOGRAPHY Right 03/25/2024   Procedure: Lower Extremity Angiography;  Surgeon: Jama Cordella MATSU, MD;  Location: [facility];  Service: Cardiovascular;  Laterality: Right;   LOWER EXTREMITY INTERVENTION  11/18/2016   Procedure: Lower Extremity Intervention;  Surgeon: Cordella MATSU Jama, MD;  Location: [facility] INV[facility] LAB;  Service: Cardiovascular;;   LOWER EXTREMITY INTERVENTION Right 03/04/2024   Procedure: LOWER EXTREMITY INTERVENTION;  Surgeon: Jama Cordella MATSU, MD;  Location: [facility] INV[facility] LAB;  Service: Cardiovascular;  Laterality: Right;   PERIPHERAL VASCULAR CATHETERIZATION Right 01/02/2015   Procedure: Lower Extremity Angiography;  Surgeon: Cordella MATSU Jama, MD;  Location: [facility];  Service: Cardiovascular;  Laterality: Right;   PERIPHERAL VASCULAR CATHETERIZATION Right 06/18/2015   Procedure: Lower Extremity Angiography;  Surgeon: Selinda RAMAN Marea, MD;  Location: [facility] LAB;  Service: Cardiovascular;  Laterality: Right;   PERIPHERAL VASCULAR  CATHETERIZATION  06/18/2015   Procedure: Lower Extremity Intervention;  Surgeon: Selinda GORMAN Gu, MD;  Location: [facility] LAB;  Service: Cardiovascular;;   PERIPHERAL VASCULAR CATHETERIZATION N/A 10/09/2015   Procedure: Abdominal Aortogram w/Lower Extremity;  Surgeon: Cordella KANDICE Shawl, MD;  Location: [facility];  Service: Cardiovascular;  Laterality: N/A;   PERIPHERAL VASCULAR CATHETERIZATION  10/09/2015   Procedure: Lower Extremity Intervention;  Surgeon: Cordella KANDICE Shawl, MD;  Location: [facility];  Service: Cardiovascular;;    PERIPHERAL VASCULAR CATHETERIZATION Right 10/10/2015   Procedure: Lower Extremity Angiography;  Surgeon: Selinda GORMAN Gu, MD;  Location: [facility];  Service: Cardiovascular;  Laterality: Right;   PERIPHERAL VASCULAR CATHETERIZATION Right 10/10/2015   Procedure: Lower Extremity Intervention;  Surgeon: Selinda GORMAN Gu, MD;  Location: [facility];  Service: Cardiovascular;  Laterality: Right;   PERIPHERAL VASCULAR CATHETERIZATION Right 12/03/2015   Procedure: Lower Extremity Angiography;  Surgeon: Selinda GORMAN Gu, MD;  Location: [facility];  Service: Cardiovascular;  Laterality: Right;   PERIPHERAL VASCULAR CATHETERIZATION Right 12/04/2015   Procedure: Lower Extremity Angiography;  Surgeon: Selinda GORMAN Gu, MD;  Location: [facility];  Service: Cardiovascular;  Laterality: Right;   PERIPHERAL VASCULAR CATHETERIZATION  12/04/2015   Procedure: Lower Extremity Intervention;  Surgeon: Selinda GORMAN Gu, MD;  Location: [facility];  Service: Cardiovascular;;   PERIPHERAL VASCULAR CATHETERIZATION Right 06/30/2016   Procedure: Lower Extremity Angiography;  Surgeon: Selinda GORMAN Gu, MD;  Location: [facility];  Service: Cardiovascular;  Laterality: Right;   PERIPHERAL VASCULAR CATHETERIZATION Right 06/25/2016   Procedure: Lower Extremity Angiography;  Surgeon: Cordella KANDICE Shawl, MD;  Location: [facility];  Service: Cardiovascular;  Laterality: Right;   PERIPHERAL VASCULAR CATHETERIZATION Right 07/01/2016   Procedure: Lower Extremity Angiography;  Surgeon: Cordella KANDICE Shawl, MD;  Location: [facility];  Service: Cardiovascular;  Laterality: Right;   PERIPHERAL VASCULAR CATHETERIZATION  07/01/2016   Procedure: Lower Extremity Intervention;  Surgeon: Cordella KANDICE Shawl, MD;  Location: [facility];  Service: Cardiovascular;;   PERIPHERAL VASCULAR CATHETERIZATION Right 08/01/2016   Procedure: Lower Extremity Angiography;  Surgeon: Cordella KANDICE Shawl, MD;   Location: [facility];  Service: Cardiovascular;  Laterality: Right;   PORTA CATH INSERTION N/A 10/05/2020   Procedure: PORTA CATH INSERTION;  Surgeon: Shawl Cordella KANDICE, MD;  Location: [facility] LAB;  Service: Cardiovascular;  Laterality: N/A;   stent placement in right leg Right    VIDEO BRONCHOSCOPY N/A 09/14/2020   Procedure: VIDEO BRONCHOSCOPY WITH FLUORO;  Surgeon: Tamea Dedra CROME, MD;  Location: [facility];  Service: Cardiopulmonary;  Laterality: N/A;   VIDEO BRONCHOSCOPY WITH ENDOBRONCHIAL ULTRASOUND N/A 09/14/2020   Procedure: VIDEO BRONCHOSCOPY WITH ENDOBRONCHIAL ULTRASOUND;  Surgeon: Tamea Dedra CROME, MD;  Location: [facility];  Service: Cardiopulmonary;  Laterality: N/A;    SOCIAL HISTORY: Social History   Socioeconomic History   Marital status: Single    Spouse name: Not on file   Number of children: Not on file   Years of education: Not on file   Highest education level: Not on file  Occupational History   Occupation: unemployed  Tobacco Use   Smoking status: Former    Current packs/day: 0.00    Average packs/day: 2.0 packs/day for 38.0 years (76.0 ttl pk-yrs)    Types: Cigarettes    Start date: 12/19/1976    Quit date: 12/20/2014    Years since quitting: 9.3  Smokeless tobacco: Former   Tobacco comments:    quit smoking in 2017 most smoked 2   Vaping Use   Vaping status: Never Used  Substance and Sexual Activity   Alcohol  use: Not Currently   Drug use: No   Sexual activity: Yes  Other Topics Concern   Not on file  Social History Narrative   Not on file   Social Drivers of Health   Financial Resource Strain: Medium Risk (02/05/2024)   Overall Financial Resource Strain (CARDIA)    Difficulty of Paying Living Expenses: Somewhat hard  Food Insecurity: No Food Insecurity (03/25/2024)   Hunger Vital Sign    Worried About Running Out of Food in the Last Year: Never true    Ran Out of Food in the Last Year: Never true  Transportation Needs: No  Transportation Needs (03/25/2024)   PRAPARE - Administrator, Civil Service (Medical): No    Lack of Transportation (Non-Medical): No  Physical Activity: Inactive (02/05/2024)   Exercise Vital Sign    Days of Exercise per Week: 1 day    Minutes of Exercise per Session: 0 min  Stress: No Stress Concern Present (02/05/2024)   [employer] of Occupational Health - Occupational Stress Questionnaire    Feeling of Stress: Only a little  Social Connections: Socially Isolated (03/25/2024)   Social Connection and Isolation Panel    Frequency of Communication with Friends and Family: Three times a week    Frequency of Social Gatherings with Friends and Family: Once a week    Attends Religious Services: Never    Database administrator or Organizations: No    Attends Banker Meetings: Never    Marital Status: Never married  Intimate Partner Violence: Not At Risk (03/25/2024)   Humiliation, Afraid, Rape, and Kick questionnaire    Fear of Current or Ex-Partner: No    Emotionally Abused: No    Physically Abused: No    Sexually Abused: No    FAMILY HISTORY: Family History  Problem Relation Age of Onset   Diabetes Mother    Hypertension Mother    Heart murmur Mother    Leukemia Mother    Throat cancer Maternal Grandmother    Colon cancer Neg Hx    Breast cancer Neg Hx     ALLERGIES:  has no known allergies.  MEDICATIONS:  Current Outpatient Medications  Medication Sig Dispense Refill   aspirin  EC 81 MG tablet Take 81 mg by mouth daily. Swallow whole.     clopidogrel  (PLAVIX ) 75 MG tablet TAKE 1 TABLET BY MOUTH DAILY 100 tablet 2   levETIRAcetam  (KEPPRA ) 1000 MG tablet Take 1 tablet (1,000 mg total) by mouth 2 (two) times daily. 60 tablet 2   levothyroxine  (SYNTHROID ) 125 MCG tablet Take 1 tablet (125 mcg total) by mouth daily before breakfast. 90 tablet 0   ondansetron  (ZOFRAN ) 8 MG tablet Take 1 tablet (8 mg total) by mouth every 8 (eight) hours as needed for  nausea or vomiting. 60 tablet 1   potassium chloride  SA (KLOR-CON  M) 20 MEQ tablet Take 1 tablet (20 mEq total) by mouth daily. 30 tablet 2   rosuvastatin  (CRESTOR ) 40 MG tablet Take 1 tablet (40 mg total) by mouth daily. 90 tablet 3   No current facility-administered medications for this visit.   Facility-Administered Medications Ordered in Other Visits  Medication Dose Route Frequency Provider Last Rate Last Admin   albuterol  (PROVENTIL ) (2.5 MG/3ML) 0.083% nebulizer solution 2.5 mg  2.5 mg Nebulization Once Tamea Dedra CROME, MD       heparin  lock flush 100 unit/mL  500 Units Intracatheter Once PRN [name], MD         PHYSICAL EXAMINATION: ECOG PERFORMANCE STATUS: 1 - Symptomatic but completely ambulatory Vitals:   04/25/24 0849  BP: (!) 138/91  Pulse: 100  Resp: 20  Temp: (!) 97.4 F (36.3 C)  SpO2: 100%    Filed Weights   04/25/24 0849  Weight: 183 lb 12.8 oz (83.4 kg)     Physical Exam Constitutional:      General: He is not in acute distress.    Appearance: He is obese.  HENT:     Head: Normocephalic and atraumatic.  Eyes:     General: No scleral icterus. Cardiovascular:     Rate and Rhythm: Normal rate and regular rhythm.  Pulmonary:     Effort: Pulmonary effort is normal. No respiratory distress.     Breath sounds: Normal breath sounds. No wheezing.  Abdominal:     General: There is no distension.     Palpations: Abdomen is soft.     Tenderness: There is no abdominal tenderness.  Musculoskeletal:     Cervical back: Normal range of motion and neck supple.     Comments: Right lower extremity wrapped.   Skin:    General: Skin is warm and dry.     Findings: Bruising present. No erythema.  Neurological:     Mental Status: He is alert and oriented to person, place, and time. Mental status is at baseline.     Cranial Nerves: No cranial nerve deficit.     Motor: No abnormal muscle tone.  Psychiatric:        Mood and Affect: Mood and affect normal.          LABORATORY DATA:  I have reviewed the data as listed    Latest Ref Rng & Units 04/25/2024    8:30 AM 04/12/2024    8:48 AM 03/28/2024   12:54 PM  CBC  WBC 4.0 - 10.5 K/uL 4.5  4.6  6.0   Hemoglobin 13.0 - 17.0 g/dL 88.7  88.8  88.4   Hematocrit 39.0 - 52.0 % 35.3  35.2  35.9   Platelets 150 - 400 K/uL 281  285  226       Latest Ref Rng & Units 04/25/2024    8:30 AM 04/12/2024    8:48 AM 03/28/2024   12:54 PM  CMP  Glucose 70 - 99 mg/dL 881  836  836   BUN 8 - 23 mg/dL 7  8  6    Creatinine 0.61 - 1.24 mg/dL 9.12  8.91  9.01   Sodium 135 - 145 mmol/L 140  139  134   Potassium 3.5 - 5.1 mmol/L 3.8  3.3  3.5   Chloride 98 - 111 mmol/L 107  106  99   CO2 22 - 32 mmol/L 24  21  22    Calcium  8.9 - 10.3 mg/dL 9.3  8.7  8.7   Total Protein 6.5 - 8.1 g/dL 6.7  6.2  6.4   Total Bilirubin 0.0 - 1.2 mg/dL 0.6  0.6  0.4   Alkaline Phos 38 - 126 U/L 69  75  74   AST 15 - 41 U/L 30  32  37   ALT 0 - 44 U/L 13  8  8       RADIOGRAPHIC STUDIES: I have personally reviewed the radiological images  as listed and agreed with the findings in the report. VAS US  LOWER EXTREMITY ARTERIAL DUPLEX Result Date: 04/22/2024 LOWER EXTREMITY ARTERIAL DUPLEX STUDY Patient Name:  MENDELL BONTEMPO  Date of Exam:   04/21/2024 Medical Rec #: [id number]            Accession #:    [id number] Date of Birth: 09-11-1955           Patient Gender: M Patient Age:   22 years Exam Location:  [facility] Procedure:      VAS US  LOWER EXTREMITY ARTERIAL DUPLEX Referring Phys: [name] --------------------------------------------------------------------------------  Indications: Ulceration, peripheral artery disease, and Routine f/u. High Risk Factors: Hyperlipidemia. Other Factors: ASO s/p multiple interventions and bypass.                11/15/2019 Right thrombectomy of Fempop insitu graft with added                stent throughout.                 12/01/2019: Aortogram and Selctive Right Lower  Extremity                Angiogram. 6 mg of TPA delivered in a lysis catheterfrom the                Origin of the Right Femoral -Popliteal bypass down the proximal                ATA with placement of the lysis.                 12/02/2019: PTA of the Right Posterior Tibial Artery. PTA of the                Right Peroneal Artery. Mechanical Thrombectomy of the Right                Femoral -Popliteal Bypass graft. Mechanical Thrombectomy of the                Right Tibioperoneal Trunk and Posterior Tibial Artery.                 09/25/2021: Right Lower Extremity Angiography third order                catheter placement. PTA of the proximal peroneal and                tibioperoneal trunk to 2.5 mm with an ultra score balloon. PTA of                the Femoral Artery and the proximal anstomosis of the bypass                graft to 4 mm with an ultra score balloon. Administration of 5 mg                of tpa throughout the entire bypass and in the peroneal Artery.                Initiation of continuous tpa infusion Right lower Extremity for                limb salvage. Insertion of a triple lumen catheter Left CFV with                US  and fluoroscopic guidance.                 09/26/2021: Right LE Angiogram. PTA  of the Right Posterior Tibial                Artery with 2.5 mm diameter by 30 cm length angioplasty balloon.                PTA of the distal bypass anastomosis and distal bypass graft                analogous to the Popliteal artery with 4 mm diameter by 8 cm                length Lutonix drug coated angioplasty balloon.                04/08/2022 right thrombectomy of the Deep profunda.  Vascular Interventions: 03/25/2024 Right lower extremity angiography                         3. Atherectomy right common femoral artery,                         femoral-popliteal bypass and distal popliteal arteries                         with the [place] Rex catheter                         4. Ultrasound-guided access to the  left common femoral                         artery                         5. Mechanical thrombectomy using the penumbra lightening                         bolt 7 catheter                         6. Percutaneous kissing stents in the profunda femoris                         and origin of the femoral-popliteal bypass with a 9 x 40                         life star stent in the profunda and a 7 x 100 life stent                         in the femoral-popliteal bypass                         7. Percutaneous angioplasty and stent placement distal                         bypass and popliteal artery with a 6 mm x 40 mm life                         stent.                         8. Percutaneous transluminal angioplasty and stent  placement right tibioperoneal trunk and posterior tibial                         artery using 3 Esprit stents postdilated to 4 mm                         proximally and 3 mm distally.                          Hx of multiple right lower extremity PTA's/stents.                         09/10/2016 Right femoral to below the knee popliteal                         artery bypass graft placement. 5/01 Right bypass graft &                         mid popliteal artery PTA & coil embolectomy of large                         branch of bypass graft. 12/09/16 Right distal bypass                         graft PTA/stent and right posterior tibial artery PTA.                         03/23/2020 Rt PTA thrombectomy. Rt Profunda fem artery to                         PTA bypass. Current ABI:            rt = 1.13 Performing Technologist: Jerel Croak RVT  Examination Guidelines: A complete evaluation includes B-mode imaging, spectral Doppler, color Doppler, and power Doppler as needed of all accessible portions of each vessel. Bilateral testing is considered an integral part of a complete examination. Limited examinations for reoccurring indications may be performed as noted.   +-----------+--------+-----+--------+----------+--------+ RIGHT      PSV cm/sRatioStenosisWaveform  Comments +-----------+--------+-----+--------+----------+--------+ CFA Mid    54                   biphasic           +-----------+--------+-----+--------+----------+--------+ DFA        189                  triphasic          +-----------+--------+-----+--------+----------+--------+ SFA Prox                                  BPG      +-----------+--------+-----+--------+----------+--------+ SFA Mid                                   BPG      +-----------+--------+-----+--------+----------+--------+ SFA Distal                                BPG      +-----------+--------+-----+--------+----------+--------+  POP Distal 38                   biphasic           +-----------+--------+-----+--------+----------+--------+ ATA Distal 19                   monophasic         +-----------+--------+-----+--------+----------+--------+ PTA Distal 48                   triphasic          +-----------+--------+-----+--------+----------+--------+ PERO Distal19                   monophasic         +-----------+--------+-----+--------+----------+--------+  Right Graft #1: FemPop +------------------+--------+--------+---------+--------+                   PSV cm/sStenosisWaveform Comments +------------------+--------+--------+---------+--------+ Inflow            54              biphasic          +------------------+--------+--------+---------+--------+ Prox Anastomosis  52              biphasic          +------------------+--------+--------+---------+--------+ Proximal Graft    64              biphasic          +------------------+--------+--------+---------+--------+ Mid Graft         63              triphasic         +------------------+--------+--------+---------+--------+ Distal Graft      74              biphasic           +------------------+--------+--------+---------+--------+ Distal Anastomosis58              triphasic         +------------------+--------+--------+---------+--------+ Outflow           38              biphasic          +------------------+--------+--------+---------+--------+   Summary: Right: Patent FemPop BPG and new stents throughout. Strong triphasic flow to distal PTA. NL abi  See table(s) above for measurements and observations. Electronically signed by [name] MD on 04/22/2024 at 9:35:02 AM.    Final    VAS US  ABI WITH/WO TBI Result Date: 04/22/2024  LOWER EXTREMITY DOPPLER STUDY Patient Name:  MICHAE GRIMLEY  Date of Exam:   04/21/2024 Medical Rec #: [id number]            Accession #:    [id number] Date of Birth: 07/14/1956           Patient Gender: M Patient Age:   39 years Exam Location:  [facility] Procedure:      VAS US  ABI WITH/WO TBI Referring Phys: [facility] --------------------------------------------------------------------------------  Indications: Ulceration, peripheral artery disease, and Routine f/u. High Risk Factors: Hyperlipidemia. Other Factors: ASO s/p multiple interventions and bypass.                11/15/2019 Right thrombectomy of Fempop insitu graft with added                stent throughout.                 12/01/2019: Aortogram and Selctive Right Lower Extremity  Angiogram. 6 mg of TPA delivered in a lysis catheterfrom the                Origin of the Right Femoral -Popliteal bypass down the proximal                ATA with placement of the lysis.                 12/02/2019: PTA of the Right Posterior Tibial Artery. PTA of the                Right Peroneal Artery. Mechanical Thrombectomy of the Right                Femoral -Popliteal Bypass graft. Mechanical Thrombectomy of the                Right Tibioperoneal Trunk and Posterior Tibial Artery.                 09/25/2021: Right Lower Extremity Angiography third order                 catheter placement. PTA of the proximal peroneal and                tibioperoneal trunk to 2.5 mm with an ultra score balloon. PTA of                the Femoral Artery and the proximal anstomosis of the bypass                graft to 4 mm with an ultra score balloon. Administration of 5 mg                of tpa throughout the entire bypass and in the peroneal Artery.                Initiation of continuous tpa infusion Right lower Extremity for                limb salvage. Insertion of a triple lumen catheter Left CFV with                US  and fluoroscopic guidance.                 09/26/2021: Right LE Angiogram. PTA of the Right Posterior Tibial                Artery with 2.5 mm diameter by 30 cm length angioplasty balloon.                PTA of the distal bypass anastomosis and distal bypass graft                analogous to the Popliteal artery with 4 mm diameter by 8 cm                length Lutonix drug coated angioplasty balloon.                04/08/2022 right thrombectomy of the Deep profunda.  Vascular Interventions: 03/25/2024 Right lower extremity angiography                         3. Atherectomy right common femoral artery,                         femoral-popliteal bypass and distal popliteal arteries  with the [place] Rex catheter                         4. Ultrasound-guided access to the left common femoral                         artery                         5. Mechanical thrombectomy using the penumbra lightening                         bolt 7 catheter                         6. Percutaneous kissing stents in the profunda femoris                         and origin of the femoral-popliteal bypass with a 9 x 40                         life star stent in the profunda and a 7 x 100 life stent                         in the femoral-popliteal bypass                         7. Percutaneous angioplasty and stent placement distal                         bypass and popliteal artery  with a 6 mm x 40 mm life                         stent.                         8. Percutaneous transluminal angioplasty and stent                         placement right tibioperoneal trunk and posterior tibial                         artery using 3 Esprit stents postdilated to 4 mm                         proximally and 3 mm distally.                          Hx of multiple right lower extremity PTA's/stents.                         09/10/2016 Right femoral to below the knee popliteal                         artery bypass graft placement. 5/01 Right bypass graft &                         mid popliteal artery PTA & coil embolectomy of large  branch of bypass graft. 12/09/16 Right distal bypass                         graft PTA/stent and right posterior tibial artery PTA.                         03/23/2020 Rt PTA thrombectomy. Rt Profunda fem artery to                         PTA bypass. Performing Technologist: Jerel Croak RVT  Examination Guidelines: A complete evaluation includes at minimum, Doppler waveform signals and systolic blood pressure reading at the level of bilateral brachial, anterior tibial, and posterior tibial arteries, when vessel segments are accessible. Bilateral testing is considered an integral part of a complete examination. Photoelectric Plethysmograph (PPG) waveforms and toe systolic pressure readings are included as required and additional duplex testing as needed. Limited examinations for reoccurring indications may be performed as noted.  ABI Findings: +---------+------------------+-----+--------+--------+ Right    Rt Pressure (mmHg)IndexWaveformComment  +---------+------------------+-----+--------+--------+ Brachial 128                                     +---------+------------------+-----+--------+--------+ PTA      140               1.08 biphasic         +---------+------------------+-----+--------+--------+ DP       147               1.13  biphasic         +---------+------------------+-----+--------+--------+ Great Toe145               1.12 Normal           +---------+------------------+-----+--------+--------+ +---------+------------------+-----+---------+-------+ Left     Lt Pressure (mmHg)IndexWaveform Comment +---------+------------------+-----+---------+-------+ Brachial 130                                     +---------+------------------+-----+---------+-------+ PTA      145               1.12 triphasic        +---------+------------------+-----+---------+-------+ DP       144               1.11 triphasic        +---------+------------------+-----+---------+-------+ Great Toe152               1.17 Normal           +---------+------------------+-----+---------+-------+ +-------+-----------+-----------+------------+------------+ ABI/TBIToday's ABIToday's TBIPrevious ABIPrevious TBI +-------+-----------+-----------+------------+------------+ Right  1.13       1.12       .37         .26          +-------+-----------+-----------+------------+------------+ Left   1.12       1.17       1.13        .75          +-------+-----------+-----------+------------+------------+  Right TBIs and ABIs appear increased compared to prior study on 02/2024.  Summary: Right: Resting right ankle-brachial index is within normal range. The right toe-brachial index is normal.  Left: Resting left ankle-brachial index is within normal range. The left toe-brachial index is normal.  *See table(s) above for measurements and observations.  Electronically signed by [name] MD on 04/22/2024 at 9:34:54 AM.    Final    MR BRAIN W WO CONTRAST Result Date: 04/11/2024 CLINICAL DATA:  Brain/CNS neoplasm, assess treatment response EXAM: MRI HEAD WITHOUT AND WITH CONTRAST TECHNIQUE: Multiplanar, multiecho pulse sequences of the brain and surrounding structures were obtained without and with intravenous contrast. CONTRAST:   9 mL of Vueway  COMPARISON:  MRI head January 04, 2024. FINDINGS: Brain: Slight increase in size of a centrally necrotic 9 mm lesion in the right occipital lobe. Similar versus slightly decreased necrotic left parietal lesion which measures approximately 2.1 x 1.9 cm. Similar versus slightly improved/decreased 1.1 x 1.1 cm lesion in the left frontal operculum. Unchanged punctate focus of enhancement in the left cerebellum (series 14, image 42). Edema surrounding these lesions is not substantially changed. No progressive mass effect. No midline shift. No evidence of acute infarct, acute hemorrhage, or hydrocephalus. Vascular: Normal flow voids. Skull and upper cervical spine: Normal marrow signal. Sinuses/Orbits: Clear sinuses.  No acute findings. IMPRESSION: 1. 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 This could represent treatment related change or progression. Recommend attention on follow-up. 2. 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 3. Unchanged punctate lesion in the left cerebellum, most likely a vessel but warranting attention on follow-up. 4. 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 Recommend ENT follow-up if not previously evaluated. Electronically Signed   By: Gilmore GORMAN Molt M.D.   On: 04/11/2024 13:00   PERIPHERAL VASCULAR CATHETERIZATION Result Date: 03/25/2024 See surgical note for result.  PERIPHERAL VASCULAR CATHETERIZATION Result Date: 03/04/2024 See surgical note for result.  CT CHEST ABDOMEN PELVIS W CONTRAST Result Date: 03/01/2024 CLINICAL DATA:  Lung cancer. Assess treatment response. * Tracking Code: BO * EXAM: CT CHEST, ABDOMEN, AND PELVIS WITH CONTRAST TECHNIQUE: Multidetector CT imaging of the chest, abdomen and pelvis was performed following the standard protocol during bolus administration of intravenous contrast. RADIATION  DOSE REDUCTION: This exam was performed according to the departmental dose-optimization program which includes automated exposure control, adjustment of the mA and/or kV according to patient size and/or use of iterative reconstruction technique. CONTRAST:  OMNIPAQUE  IOHEXOL  300 MG/ML  SOLN COMPARISON:  CT 526 1,025 com PET-CT 2000 FINDINGS: CT CHEST FINDINGS Cardiovascular: Port in the anterior chest wall with tip in distal SVC. No significant vascular findings. Normal heart size. No pericardial effusion. Mediastinum/Nodes: No axillary or supraclavicular adenopathy. No mediastinal or hilar adenopathy. No pericardial fluid. Esophagus normal. Lungs/Pleura: Post RIGHT upper lobe resection. RIGHT suprahilar peribronchial thickening unchanged from prior. No new nodularity the RIGHT lung. LEFT lung clear. Musculoskeletal: No aggressive osseous lesion. CT ABDOMEN AND PELVIS FINDINGS Hepatobiliary: No focal hepatic lesion. No biliary ductal dilatation. Gallbladder is normal. Common bile duct is normal. Pancreas: Pancreas is normal. No ductal dilatation. No pancreatic inflammation. Spleen: Normal spleen Adrenals/urinary tract: Adrenal glands and kidneys are normal. The ureters and bladder normal. Stomach/Bowel: Stomach, small bowel, appendix, and cecum are normal. Multiple diverticula of the descending colon and sigmoid colon without acute inflammation. Vascular/Lymphatic: Abdominal aorta is normal caliber. There is no retroperitoneal or periportal lymphadenopathy. No pelvic lymphadenopathy. Reproductive: Unremarkable Other: No free fluid. Musculoskeletal: No aggressive osseous lesion. IMPRESSION: CHEST: 1. Post RIGHT upper lobe resection. 2. No evidence of lung cancer recurrence. 3. No mediastinal adenopathy. PELVIS: 1. No evidence of metastatic disease in the abdomen pelvis. 2. No skeletal metastasis.  Electronically Signed   By: Jackquline Boxer M.D.   On: 03/01/2024 16:37   VAS US  ABI WITH/WO TBI Result Date:  02/29/2024  LOWER EXTREMITY DOPPLER STUDY Patient Name:  MUHAMMAD VACCA  Date of Exam:   02/25/2024 Medical Rec #: [id number]            Accession #:    [id number] Date of Birth: [birth year]           Patient Gender: M Patient Age:   61 years Exam Location:  [facility] Procedure:      VAS US  ABI WITH/WO TBI Referring Phys: --------------------------------------------------------------------------------  Indications: Ulceration, peripheral artery disease, and Routine f/u. Patient              reports no claudication or lower extremity pain. High Risk Factors: Hyperlipidemia, past history of smoking. Other Factors: ASO s/p multiple interventions and bypass.                11/15/2019 Right thrombectomy of Fempop insitu graft with added                stent throughout.                 12/01/2019: Aortogram and Selective Right Lower Extremity                Angiogram. 6 mg of TPA delivered in a lysis catheter the Origin                of the Right Femoral -Popliteal bypass down the proximal ATA with                placement of the lysis.                 12/02/2019: PTA of the Right Posterior Tibial Artery. PTA of the                Right Peroneal Artery. Mechanical Thrombectomy of the Right                Femoral -Popliteal Bypass graft. Mechanical Thrombectomy of the                Right Tibioperoneal Trunk and Posterior Tibial Artery.                 09/25/2021: Right Lower Extremity Angiography third order                catheter placement. PTA of the proximal peroneal and                tibioperoneal trunk to 2.5 mm with an ultra score balloon. PTA of                the Femoral Artery and the proximal anastomosis of the bypass                graft to 4 mm with an ultra score balloon. Administration of 5 mg                of tpa throughout the entire bypass and in the peroneal Artery.                Initiation of continuous tpa infusion Right lower Extremity for                limb salvage. Insertion of  a triple lumen catheter Left CFV with  US  and fluoroscopic guidance.                 09/26/2021: Right LE Angiogram. PTA of the Right Posterior Tibial                Artery with 2.5 mm diameter by 30 cm length angioplasty balloon.                PTA of the distal bypass anastomosis and distal bypass graft                analogous to the Popliteal artery with 4 mm diameter by 8 cm                length Lutonix drug coated angioplasty balloon.                04/08/2022 right thrombectomy of the Deep profunda.  Vascular Interventions: Hx of multiple right lower extremity PTA's/stents.                         09/10/2016 Right femoral to below the knee popliteal                         artery bypass graft placement. 5/01 Right bypass graft &                         mid popliteal artery PTA & coil embolectomy of large                         branch of bypass graft. 12/09/16 Right distal bypass                         graft PTA/stent and right posterior tibial artery PTA.                         03/23/2020 Rt PTA thrombectomy. Rt Profunda fem artery to                         PTA bypass. Performing Technologist: Donnice Charnley RVT  Examination Guidelines: A complete evaluation includes at minimum, Doppler waveform signals and systolic blood pressure reading at the level of bilateral brachial, anterior tibial, and posterior tibial arteries, when vessel segments are accessible. Bilateral testing is considered an integral part of a complete examination. Photoelectric Plethysmograph (PPG) waveforms and toe systolic pressure readings are included as required and additional duplex testing as needed. Limited examinations for reoccurring indications may be performed as noted.  ABI Findings: +---------+------------------+-----+----------+--------+ Right    Rt Pressure (mmHg)IndexWaveform  Comment  +---------+------------------+-----+----------+--------+ Brachial 167                                        +---------+------------------+-----+----------+--------+ PTA      61                0.37 monophasic         +---------+------------------+-----+----------+--------+ DP       54                0.32 monophasic         +---------+------------------+-----+----------+--------+ Burnetta Moloney  0.26                    +---------+------------------+-----+----------+--------+ +---------+------------------+-----+---------+-------+ Left     Lt Pressure (mmHg)IndexWaveform Comment +---------+------------------+-----+---------+-------+ Brachial 164                                     +---------+------------------+-----+---------+-------+ PTA      165               0.99 biphasic         +---------+------------------+-----+---------+-------+ DP       189               1.13 triphasic        +---------+------------------+-----+---------+-------+ Great Toe125               0.75                  +---------+------------------+-----+---------+-------+ +-------+-----------+-----------+------------+------------+ ABI/TBIToday's ABIToday's TBIPrevious ABIPrevious TBI +-------+-----------+-----------+------------+------------+ Right  0.37       0.26       0.44        0.24         +-------+-----------+-----------+------------+------------+ Left   1.13       0.75       1.13        0.61         +-------+-----------+-----------+------------+------------+ Bilateral ABIs appear essentially unchanged compared to prior study on 07/09/2023.  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 The right toe-brachial index is abnormal. Left: Resting left ankle-brachial index is within normal range. The left toe-brachial index is normal. *See table(s) above for measurements and observations.  Electronically signed by [name] MD on 02/29/2024 at 7:39:27 AM.    Final

## 2024-04-25 NOTE — Assessment & Plan Note (Signed)
 Follow up with [name].  Continue keppra 

## 2024-04-25 NOTE — Assessment & Plan Note (Signed)
 oral potassium 20meq daily, stable K

## 2024-04-25 NOTE — Assessment & Plan Note (Addendum)
 continue levothyroxine 125 mcg daily

## 2024-04-25 NOTE — Assessment & Plan Note (Addendum)
 Off bevacizumab  04/11/2024 MRI brain w wo contrast results were reviewed. He is asymptomatic.  Follow up  with [name].

## 2024-04-26 ENCOUNTER — Ambulatory Visit (INDEPENDENT_AMBULATORY_CARE_PROVIDER_SITE_OTHER): Admitting: Nurse Practitioner

## 2024-04-26 ENCOUNTER — Other Ambulatory Visit: Payer: Self-pay

## 2024-04-26 MED FILL — Levetiracetam Tab 1000 MG: ORAL | 30 days supply | Qty: 60 | Fill #1 | Status: AC

## 2024-05-03 ENCOUNTER — Encounter: Payer: Self-pay | Admitting: Oncology

## 2024-05-04 ENCOUNTER — Telehealth: Payer: Self-pay

## 2024-05-04 ENCOUNTER — Other Ambulatory Visit: Payer: Self-pay

## 2024-05-04 NOTE — Telephone Encounter (Addendum)
 Voicemail from [facility] Archivist. He says that pt is scheduled to get tooth pulled next week. He knows that pt is on treatment and there may have been some changes in regimen, so is wanting to know if it safe for patient to proceed with tooth extraction. Please advise.   Brown Hurst: [phone]

## 2024-05-04 NOTE — Telephone Encounter (Signed)
 Per Dr. 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 However, he is on Plavix  and Asprin, managed by vascular surgery. She recommends to reach out them for clearance

## 2024-05-06 ENCOUNTER — Encounter: Payer: Self-pay | Admitting: Oncology

## 2024-05-06 NOTE — Telephone Encounter (Signed)
 Spoke to Tyler and notified him of MD recommendation.

## 2024-05-09 ENCOUNTER — Inpatient Hospital Stay

## 2024-05-09 ENCOUNTER — Encounter: Payer: Self-pay | Admitting: Oncology

## 2024-05-09 ENCOUNTER — Inpatient Hospital Stay (HOSPITAL_BASED_OUTPATIENT_CLINIC_OR_DEPARTMENT_OTHER): Admitting: Oncology

## 2024-05-09 VITALS — BP 133/76 | HR 88 | Temp 97.8°F | Resp 18 | Wt 187.2 lb

## 2024-05-09 VITALS — BP 125/73 | HR 69

## 2024-05-09 DIAGNOSIS — R569 Unspecified convulsions: Secondary | ICD-10-CM

## 2024-05-09 DIAGNOSIS — E876 Hypokalemia: Secondary | ICD-10-CM

## 2024-05-09 DIAGNOSIS — C3491 Malignant neoplasm of unspecified part of right bronchus or lung: Secondary | ICD-10-CM

## 2024-05-09 DIAGNOSIS — C7931 Secondary malignant neoplasm of brain: Secondary | ICD-10-CM

## 2024-05-09 DIAGNOSIS — E032 Hypothyroidism due to medicaments and other exogenous substances: Secondary | ICD-10-CM | POA: Diagnosis not present

## 2024-05-09 DIAGNOSIS — Z5112 Encounter for antineoplastic immunotherapy: Secondary | ICD-10-CM

## 2024-05-09 LAB — CBC WITH DIFFERENTIAL/PLATELET
Abs Immature Granulocytes: 0.02 K/uL (ref 0.00–0.07)
Basophils Absolute: 0.1 K/uL (ref 0.0–0.1)
Basophils Relative: 1 %
Eosinophils Absolute: 0.3 K/uL (ref 0.0–0.5)
Eosinophils Relative: 5 %
HCT: 34.8 % — ABNORMAL LOW (ref 39.0–52.0)
Hemoglobin: 11 g/dL — ABNORMAL LOW (ref 13.0–17.0)
Immature Granulocytes: 0 %
Lymphocytes Relative: 20 %
Lymphs Abs: 1.3 K/uL (ref 0.7–4.0)
MCH: 24.5 pg — ABNORMAL LOW (ref 26.0–34.0)
MCHC: 31.6 g/dL (ref 30.0–36.0)
MCV: 77.5 fL — ABNORMAL LOW (ref 80.0–100.0)
Monocytes Absolute: 0.7 K/uL (ref 0.1–1.0)
Monocytes Relative: 11 %
Neutro Abs: 4 K/uL (ref 1.7–7.7)
Neutrophils Relative %: 63 %
Platelets: 244 K/uL (ref 150–400)
RBC: 4.49 MIL/uL (ref 4.22–5.81)
RDW: 16.6 % — ABNORMAL HIGH (ref 11.5–15.5)
WBC: 6.3 K/uL (ref 4.0–10.5)
nRBC: 0 % (ref 0.0–0.2)

## 2024-05-09 LAB — COMPREHENSIVE METABOLIC PANEL WITH GFR
ALT: 16 U/L (ref 0–44)
AST: 29 U/L (ref 15–41)
Albumin: 3.5 g/dL (ref 3.5–5.0)
Alkaline Phosphatase: 60 U/L (ref 38–126)
Anion gap: 9 (ref 5–15)
BUN: 12 mg/dL (ref 8–23)
CO2: 23 mmol/L (ref 22–32)
Calcium: 9.1 mg/dL (ref 8.9–10.3)
Chloride: 107 mmol/L (ref 98–111)
Creatinine, Ser: 0.81 mg/dL (ref 0.61–1.24)
GFR, Estimated: >60 mL/min (ref >60)
Glucose, Bld: 125 mg/dL — ABNORMAL HIGH (ref 70–99)
Potassium: 3.6 mmol/L (ref 3.5–5.1)
Sodium: 139 mmol/L (ref 135–145)
Total Bilirubin: 0.5 mg/dL (ref 0.0–1.2)
Total Protein: 6.6 g/dL (ref 6.5–8.1)

## 2024-05-09 LAB — TSH: TSH: 10.741 u[IU]/mL — ABNORMAL HIGH (ref 0.350–4.500)

## 2024-05-09 MED ORDER — SODIUM CHLORIDE 0.9 % IV SOLN
10.0000 mg/kg | Freq: Once | INTRAVENOUS | Status: AC
  Administered 2024-05-09: 860 mg via INTRAVENOUS
  Filled 2024-05-09: qty 7.2

## 2024-05-09 MED ORDER — SODIUM CHLORIDE 0.9 % IV SOLN
Freq: Once | INTRAVENOUS | Status: AC
  Filled 2024-05-09: qty 250

## 2024-05-09 NOTE — Assessment & Plan Note (Signed)
 Off bevacizumab  04/11/2024 MRI brain w wo contrast results were reviewed. He is asymptomatic.  Follow up  with [name].

## 2024-05-09 NOTE — Assessment & Plan Note (Signed)
 oral potassium 20meq daily, stable K

## 2024-05-09 NOTE — Assessment & Plan Note (Signed)
 continue levothyroxine 125 mcg daily

## 2024-05-09 NOTE — Assessment & Plan Note (Signed)
 Follow up with [name].  Continue keppra 

## 2024-05-09 NOTE — Assessment & Plan Note (Signed)
Durvalumab treatment as planned.  

## 2024-05-09 NOTE — Progress Notes (Signed)
 Hematology/Oncology Progress note [phone] Fax:([phone]) [phone]    CHIEF COMPLAINTS/REASON FOR VISIT:  Follow up for lung cancer  ASSESSMENT & PLAN:   Cancer Staging  Squamous cell carcinoma of right lung (HCC) Staging form: Lung, AJCC 8th Edition - Clinical stage from 09/20/2020: Stage IV (cT1b, cN2, cM1) - Signed by Joshua Call, MD on 12/13/2021   Squamous cell carcinoma of right lung (HCC) Durvalumab  was previously held due to pneumonitis.  Labs are reviewed and discussed with patient. Proceed with Durvalumab     Encounter for antineoplastic immunotherapy Durvalumab  treatment as planned.   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 He is asymptomatic.  Follow up  with Joshua [name].   Seizure (HCC) Follow up with Joshua [name].  Continue keppra      Follow up  2 weeks lab MD Durvalumab   All questions were answered. The patient knows to [name] the clinic with any problems, questions or concerns.  [name] Babara, MD, PhD [facility] Hematology Oncology 05/09/2024       HISTORY OF PRESENTING ILLNESS:  Joshua [name] is a 68 y.o. male who has above history reviewed by me today presents for follow up visit for Stage IV lung squamous cell carcinoma. Oncology History  Squamous cell carcinoma of right lung (HCC)  09/18/2020 Imaging   PET scan showed right upper lobe nodule maximal SUV 11.5.  Hypermetabolic cluster right paratracheal adenopathy with maximum SUV up to 12.  No findings of metastatic disease to the neck/abdomen/pelvis or skeleton.   09/20/2020 Initial Diagnosis   Squamous cell carcinoma of right lung (HCC) NGS showed PD-L1 TPS 95%, LRP1B S997fs, PIK3CA E545K, RB1 c1422-2A>T, RIT1 T83_ A84del, STK11 P281fs, TPS53 R248W, TMB 19.37mut/mb, MS stable.     09/20/2020 Cancer Staging   Staging form: Lung, AJCC 8th Edition - Clinical stage from 09/20/2020: Stage IV (cT1b, cN2, cM1) - Signed by Joshua Call, MD on 12/13/2021   10/03/2020 Imaging   MRI brain with and without contrast showed no evidence of intracranial metastatic disease. Well-defined round lesion within the left parotid tail measuring approximately 2.3 x 1.8 x 1.8 cm   10/09/2020 - 11/27/2020 Radiation Therapy   concurrent chemoradiation   10/10/2020 - 10/31/2020 Chemotherapy   10/10/2020 concurrent chemoradiation.  carboplatin  AUC of 2 and Taxol  45 mg/m2 x 4.  Additional chemotherapy was held due to thrombocytopenia, neutropenia       12/31/2020 - 03/21/2022 Chemotherapy   Maintenance  Durvalumab  q14d      12/31/2020 -  Chemotherapy   Patient is on Treatment Plan : LUNG Durvalumab  (10) q14d     03/28/2021 Imaging   CT without contrast showed new linear right perihilar consolidation with associated traction bronchiectasis.  Likely postradiation changes.  Additional new peribronchial vascular/nodular groundglass opacities with areas of consolidation or seen primarily in the right upper and lower lobes, more distant from expected radiation fields. 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     05/09/2021 Imaging   Repeat CT chest without contrast showed evolving radiation changes in the right perihilar region likely accounting for interval increase to thickening of the posterior wall of the right upper lobe bronchus.  The more peripheral right lung groundglass opacities seen on most recent study have generally improved with exception of one peripherally right upper lobe which appears new.  These are likely inflammatory.  No findings highly suspicious for local recurrence.   05/30/2021  - 01/10/2022 Chemotherapy   Durvalumab  q14d     10/07/2021 Imaging   MRI brain with and without contrast showed 2 contrast-enhancing lesions, most consistent with metastatic disease.  Largest lesion is in the left frontal lobe and measures up to 1.3 cm with a large amount of surrounding edema and 9 mm rightward midline shift.  The other enhancing lesion was 4 mm in the right occipital lobe with a small amount of surrounding edema.  No acute or chronic hemorrhage.   10/07/2021 Progression   Headache after colonoscopy, progressive difficulty using her right arm.  Progressively worsening of speech. Stage IV lung cancer with brain metastasis.  started on Dexamethasone .  He was seen by neurosurgery and radiation oncology.  Recommendation is to proceed with SRS.  Patient was discharged with dexamethasone    10/16/2021 Imaging   PET scan showed postradiation changes in the right supra hilar lung without evidence of local recurrence.  No evidence of lung cancer recurrence or metastatic disease.  Single focus of intense metabolic activity associated with posterior left 11th rib likely secondary to recent fall/posttraumatic metabolic changes.  Frontal  uptake in the left frontal lobe corresponding to the known metastatic lesion in the brain   10/29/2021 -  Radiation Therapy   Brain Radiation [facility]   12/10/2021 Imaging   Brain MRI: Continued interval decrease in size of a left frontal lobe metastasis, now measuring 10 mm. Moderate surrounding edema has also decreased.  3-4 mm metastasis within the right occipital lobe, not significantly changed in size. There is now a small focus of central non enhancement within this lesion. Increased edema. 3 mm enhancing metastasis within the high left parietal lobe. In retrospect, this was likely present as a subtle punctate focus of enhancement on the prior brain MRI of 10/22/2021.   01/18/2022 Imaging   CT chest abdomen pelvis w contrast 1. Stable post treatment changes in the  right upper lobe medially with dense radiation fibrosis but no findings to suggest local recurrent tumor. 2. Significant progression of ill-defined ground-glass opacity, interstitial thickening and airspace nodularity in the right upper lobe and upper aspect of the right lower lobe. Findings could reflect an inflammatory or atypical infectious process, drug induced pneumonitis or interstitial spread of tumor.3. No mediastinal or hilar mass or adenopathy. 4. No findings for abdominal/pelvic metastatic disease or osseous metastatic disease.   02/19/2022 Imaging   MRI brain w wo contrast showed Three metastatic deposits in the brain appear smaller. Decreased edema in the left parietal lobe. No new lesions    04/04/2022 Imaging   CT chest w contrast 1. Stable radiation changes in the right perihilar region without evidence of local recurrence. 2. Interval improvement in the patchy ground-glass opacities previously demonstrated in both lungs, most consistent with a resolving infectious or inflammatory process (including immunotherapy-related pneumonitis). 3. No evidence of metastatic disease.   05/14/2022 Miscellaneous   He had a mechanical fall and fractured his left elbow. xrays showed a left elbow fracture  05/26/22 seen by orthopedic surgeon, cast applied.    05/27/2022 Imaging   MRI brain w wo contrast 1. All three known brain metastases have enlarged since August, with increasing regional edema. And there is a small new left anterior cerebellar 4-5 mm metastasis (  was questionably punctate on the May MRI). This constellation could reflect a combination of True Progression (Left Cerebellar lesion) and pseudoprogression/radionecrosis (3 existing mets).    2. No significant intracranial mass effect. 3. Underlying moderate cerebral white matter signal changes.   05/30/2022 Imaging   CT chest abdomen pelvis w contrast  1. Stable examination demonstrating chronic postradiation mass-like fibrosis in  the right lung with resolving postradiation pneumonitis in the adjacent lung parenchyma. There is a stable small right pleural effusion, but no definitive findings to suggest residual or recurrent disease on today's examination. 2. No signs of metastatic disease in the abdomen or pelvis.  3. Aortic atherosclerosis, in addition to two-vessel coronary artery disease. Please note that although the presence of coronary artery calcium  documents the presence of coronary artery disease, the severity of this disease and any potential stenosis cannot be assessed on this non-gated CT examination. Assessment for potential risk factor modification, dietary therapy or pharmacologic therapy may be warranted, if clinically indicated. 4. Hepatic steatosis. 5. Colonic diverticulosis without evidence of acute diverticulitis at this time. 6. Incompletely imaged but apparently occluded vascular graft in the superior aspect of the right anterior thigh. Referral to vascular specialist for further clinical evaluation should be considered if clinically appropriate. 7. Additional incidental findings, as above.   07/22/2022 Imaging   MRI brain  1. No new or enlarging lesions. No intracranial disease progression. 2. Decreased size of treated lesions of the left frontoparietal junction and left postcentral gyrus. 3. Unchanged lesions in the left cerebellum and right occipital lobe.   10/15/2022 Imaging   MRI brain w wo contrast showed Interval increase in size of multiple intraparenchymal lesions with worsened surrounding vasogenic edema. 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 No new lesions   11/05/2022 Imaging   MRI brain w wo contrast  1. Likely slight increase and right occipital and left cerebellar lesions. Otherwise, unchanged metastases, as above. No new lesions identified. 2. Partially imaged large probable lymph node in the left upper  neck, apparent on prior PET CT.   11/13/2022 -  Radiation Therapy   Each of the sites were treated with SRS/SBRT/SRT-IMRT technique to 20 Gy in 1 fraction to each of the following sites: PTV_2_LCerebel_56mm PTV_3_RCerebel_47mm PTV_4_ROccip_34mm PTV_5_LParietal_26mm   11/27/2022 Imaging   CT chest abdomen pelvis w contrast showed  Improving bilateral scattered interstitial and ground-glass opacities. Small amount of residual areas in the extreme left lower lobe inferiorly. This specific area is slightly increased from previous but overall the amount of opacities is significantly improved. Recommend continued follow-up. No developing new mass lesion, fluid collection or lymph node enlargement. Stable posttreatment changes right perihilar in the lung. Fatty liver infiltration. Colonic diverticulosis. Improved tiny right pleural effusion   02/05/2023 Imaging   MRI brain with and without contrast Interval decrease in all of the metastatic lesions, detailed above.No new lesions identified.    Imaging   CT chest abdomen pelvis w contrast showed  1. 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 2. Trace right pleural fluid or thickening, new or minimally increased. 3. Incidental findings, including: Aortic atherosclerosis (ICD10-I70.0), coronary artery atherosclerosis and emphysema (ICD10-J43.9). Hepatic steatosis.   06/25/2023 Imaging   CT chest abdomen pelvis w contrast showed 1. Unchanged post treatment/post radiation appearance of the right lung, with perihilar and suprahilar paramedian fibrosis and consolidation. 2. No evidence of lymphadenopathy or metastatic disease in the chest, abdomen, or pelvis.  3. Hepatic steatosis. 4. Coronary artery disease.   Aortic Atherosclerosis (ICD10-I70.0) and Emphysema (ICD10-J43.9).   08/26/2023 [name] Admission   hospitalization due to was observed speech and jerking movements on right  side despite being on dexamethasone . Continuous EEG suggest cortical dysfunction arising from left temporal region.  No seizure or elective form discharges were seen. Patient was discharged on dexamethasone  4 mg, Keppra , lamotrigine .    11/04/2023 -  Chemotherapy   Add Bevacizumab  5mg /kg to Durvalumab .     11/17/2023 - 11/27/2023 Radiation Therapy   Patient received radiation to brain for treatment of recurrence.   02/23/2024 Imaging   CT chest abdomen pelvis w contrast showed  CHEST:   1. Post RIGHT upper lobe resection. 2. No evidence of lung cancer recurrence. 3. No mediastinal adenopathy.   PELVIS:   1. No evidence of metastatic disease in the abdomen pelvis. 2. No skeletal metastasis.     INTERVAL HISTORY Joshua [name] is a 68 y.o. male who has above history reviewed by me today presents for follow up visit for right lung RCC.  chronic SOB,not worse.  Peripheral artery disease,Patient is on aspirin  and Plavix . He has gained weight.  No new complaints   Review of Systems  Constitutional:  Positive for fatigue. Negative for appetite change, chills, fever and unexpected weight change.  HENT:   Negative for hearing loss and voice change.   Eyes:  Negative for eye problems and icterus.  Respiratory:  Negative for chest tightness, cough and shortness of breath.   Cardiovascular:  Positive for leg swelling. Negative for chest pain.  Gastrointestinal:  Negative for abdominal distention, abdominal pain, nausea and vomiting.  Endocrine: Negative for hot flashes.  Genitourinary:  Negative for difficulty urinating, dysuria and frequency.   Musculoskeletal:  Positive for gait problem. Negative for arthralgias.  Skin:  Negative for itching and rash.  Neurological:  Positive for gait problem. Negative for extremity weakness, headaches, numbness and speech difficulty.  Hematological:  Negative for adenopathy. Bruises/bleeds easily.  Psychiatric/Behavioral:  Negative for confusion.        MEDICAL HISTORY:  Past Medical History:  Diagnosis Date   Allergy    Cancer (HCC)    lung   COPD (chronic obstructive pulmonary disease) (HCC)    Dyspnea    former smoker. 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   Malignant neoplasm metastatic to brain [facility])    Nausea with vomiting 02/01/2024   Peripheral vascular disease    Pre-diabetes    Sleep apnea     SURGICAL HISTORY: Past Surgical History:  Procedure Laterality Date   ANTERIOR CERVICAL DECOMP/DISCECTOMY FUSION N/A 03/14/2020   Procedure: ANTERIOR CERVICAL DECOMPRESSION/DISCECTOMY FUSION 3 LEVELS C4-7;  Surgeon: Clois Fret, MD;  Location: [facility] ORS;  Service: Neurosurgery;  Laterality: N/A;   BACK SURGERY  2011   CENTRAL LINE INSERTION Right 09/10/2016   Procedure: CENTRAL LINE INSERTION;  Surgeon: Jama Cordella MATSU, MD;  Location: [facility] ORS;  Service: Vascular;  Laterality: Right;   CERVICAL FUSION     C4-c7 on Mar 14, 2020   COLONOSCOPY  2013 ?   COLONOSCOPY N/A 09/09/2021   Procedure: COLONOSCOPY;  Surgeon: Onita Elspeth Sharper, DO;  Location: [name];  Service: Gastroenterology;  Laterality: N/A;   CORONARY ANGIOPLASTY     ESOPHAGOGASTRODUODENOSCOPY N/A 09/09/2021   Procedure: ESOPHAGOGASTRODUODENOSCOPY (EGD);  Surgeon: Onita Elspeth Sharper, DO;  Location: [facility];  Service: Gastroenterology;  Laterality: N/A;   FEMORAL-POPLITEAL BYPASS GRAFT Right 09/10/2016   Procedure: BYPASS GRAFT FEMORAL-POPLITEAL ARTERY ( BELOW KNEE );  Surgeon: Jama Cordella MATSU, MD;  Location: [facility] ORS;  Service: Vascular;  Laterality: Right;   FEMORAL-TIBIAL BYPASS GRAFT Right 03/23/2020   Procedure: BYPASS GRAFT RIGHT FEMORAL- DISTAL TIBIAL ARTERY WITH DISTAL FLOW GRAFT;  Surgeon: Jama Cordella MATSU, MD;  Location: [facility] ORS;  Service: Vascular;  Laterality: Right;   LEFT HEART CATH AND CORONARY ANGIOGRAPHY Left 05/16/2021   Procedure: LEFT HEART CATH AND  CORONARY ANGIOGRAPHY;  Surgeon: Lawyer Bernardino Cough, MD;  Location: [name];  Service: Cardiovascular;  Laterality: Left;   LOWER EXTREMITY ANGIOGRAPHY Right 11/18/2016   Procedure: Lower Extremity Angiography;  Surgeon: Cordella MATSU Jama, MD;  Location: [facility] INVASIVE CV LAB;  Service: Cardiovascular;  Laterality: Right;   LOWER EXTREMITY ANGIOGRAPHY Right 12/09/2016   Procedure: Lower Extremity Angiography;  Surgeon: Jama Cordella MATSU, MD;  Location: [facility];  Service: Cardiovascular;  Laterality: Right;   LOWER EXTREMITY ANGIOGRAPHY Right 11/15/2019   Procedure: LOWER EXTREMITY ANGIOGRAPHY;  Surgeon: Jama Cordella MATSU, MD;  Location: [facility];  Service: Cardiovascular;  Laterality: Right;   LOWER EXTREMITY ANGIOGRAPHY Right 12/01/2019   Procedure: Lower Extremity Angiography;  Surgeon: Marea Selinda RAMAN, MD;  Location: [facility];  Service: Cardiovascular;  Laterality: Right;   LOWER EXTREMITY ANGIOGRAPHY Right 12/02/2019   Procedure: LOWER EXTREMITY ANGIOGRAPHY;  Surgeon: Jama Cordella MATSU, MD;  Location: [facility];  Service: Cardiovascular;  Laterality: Right;   LOWER EXTREMITY ANGIOGRAPHY Right 03/23/2020   Procedure: Lower Extremity Angiography;  Surgeon: Jama Cordella MATSU, MD;  Location: [facility];  Service: Cardiovascular;  Laterality: Right;   LOWER EXTREMITY ANGIOGRAPHY Right 09/25/2021   Procedure: Lower Extremity Angiography;  Surgeon: Jama Cordella MATSU, MD;  Location: [facility];  Service: Cardiovascular;  Laterality: Right;   LOWER EXTREMITY ANGIOGRAPHY Right 09/26/2021   Procedure: Lower Extremity Angiography;  Surgeon: Marea Selinda RAMAN, MD;  Location: [facility];  Service: Cardiovascular;  Laterality: Right;   LOWER EXTREMITY ANGIOGRAPHY Right 04/08/2022   Procedure: Lower Extremity Angiography;  Surgeon: Jama Cordella MATSU, MD;  Location: [facility];  Service: Cardiovascular;  Laterality: Right;    LOWER EXTREMITY ANGIOGRAPHY Right 03/04/2024   Procedure: Lower Extremity Angiography;  Surgeon: Jama Cordella MATSU, MD;  Location: [facility];  Service: Cardiovascular;  Laterality: Right;   LOWER EXTREMITY ANGIOGRAPHY Right 03/25/2024   Procedure: Lower Extremity Angiography;  Surgeon: Jama Cordella MATSU, MD;  Location: [facility];  Service: Cardiovascular;  Laterality: Right;   LOWER EXTREMITY INTERVENTION  11/18/2016   Procedure: Lower Extremity Intervention;  Surgeon: Cordella MATSU Jama, MD;  Location: [facility];  Service: Cardiovascular;;   LOWER EXTREMITY INTERVENTION Right 03/04/2024   Procedure: LOWER EXTREMITY INTERVENTION;  Surgeon: Jama Cordella MATSU, MD;  Location: [facility];  Service: Cardiovascular;  Laterality: Right;   PERIPHERAL VASCULAR CATHETERIZATION Right 01/02/2015   Procedure: Lower Extremity Angiography;  Surgeon: Cordella MATSU Jama, MD;  Location: [facility];  Service: Cardiovascular;  Laterality: Right;   PERIPHERAL VASCULAR CATHETERIZATION Right 06/18/2015   Procedure: Lower Extremity Angiography;  Surgeon: Selinda RAMAN Marea, MD;  Location: [facility];  Service: Cardiovascular;  Laterality: Right;   PERIPHERAL VASCULAR CATHETERIZATION  06/18/2015   Procedure: Lower Extremity Intervention;  Surgeon: Selinda RAMAN Marea, MD;  Location: [facility];  Service: Cardiovascular;;   PERIPHERAL VASCULAR CATHETERIZATION N/A 10/09/2015   Procedure: Abdominal Aortogram w/Lower Extremity;  Surgeon: Cordella KANDICE Shawl, MD;  Location: [facility];  Service: Cardiovascular;  Laterality: N/A;   PERIPHERAL VASCULAR CATHETERIZATION  10/09/2015   Procedure: Lower Extremity Intervention;  Surgeon: Cordella KANDICE Shawl, MD;  Location: [facility];  Service: Cardiovascular;;   PERIPHERAL VASCULAR CATHETERIZATION Right 10/10/2015   Procedure: Lower Extremity Angiography;  Surgeon: Selinda GORMAN Gu, MD;  Location: [facility];   Service: Cardiovascular;  Laterality: Right;   PERIPHERAL VASCULAR CATHETERIZATION Right 10/10/2015   Procedure: Lower Extremity Intervention;  Surgeon: Selinda GORMAN Gu, MD;  Location: [facility];  Service: Cardiovascular;  Laterality: Right;   PERIPHERAL VASCULAR CATHETERIZATION Right 12/03/2015   Procedure: Lower Extremity Angiography;  Surgeon: Selinda GORMAN Gu, MD;  Location: [facility];  Service: Cardiovascular;  Laterality: Right;   PERIPHERAL VASCULAR CATHETERIZATION Right 12/04/2015   Procedure: Lower Extremity Angiography;  Surgeon: Selinda GORMAN Gu, MD;  Location: [facility];  Service: Cardiovascular;  Laterality: Right;   PERIPHERAL VASCULAR CATHETERIZATION  12/04/2015   Procedure: Lower Extremity Intervention;  Surgeon: Selinda GORMAN Gu, MD;  Location: [facility];  Service: Cardiovascular;;   PERIPHERAL VASCULAR CATHETERIZATION Right 06/30/2016   Procedure: Lower Extremity Angiography;  Surgeon: Selinda GORMAN Gu, MD;  Location: [facility];  Service: Cardiovascular;  Laterality: Right;   PERIPHERAL VASCULAR CATHETERIZATION Right 06/25/2016   Procedure: Lower Extremity Angiography;  Surgeon: Cordella KANDICE Shawl, MD;  Location: [facility];  Service: Cardiovascular;  Laterality: Right;   PERIPHERAL VASCULAR CATHETERIZATION Right 07/01/2016   Procedure: Lower Extremity Angiography;  Surgeon: Cordella KANDICE Shawl, MD;  Location: [facility];  Service: Cardiovascular;  Laterality: Right;   PERIPHERAL VASCULAR CATHETERIZATION  07/01/2016   Procedure: Lower Extremity Intervention;  Surgeon: Cordella KANDICE Shawl, MD;  Location: [facility];  Service: Cardiovascular;;   PERIPHERAL VASCULAR CATHETERIZATION Right 08/01/2016   Procedure: Lower Extremity Angiography;  Surgeon: Cordella KANDICE Shawl, MD;  Location: [facility];  Service: Cardiovascular;  Laterality: Right;   PORTA CATH INSERTION N/A 10/05/2020   Procedure: PORTA CATH INSERTION;  Surgeon:  Shawl Cordella KANDICE, MD;  Location: [facility];  Service: Cardiovascular;  Laterality: N/A;   stent placement in right leg Right    VIDEO BRONCHOSCOPY N/A 09/14/2020   Procedure: VIDEO BRONCHOSCOPY WITH FLUORO;  Surgeon: [name] Dedra CROME, MD;  Location: [facility];  Service: Cardiopulmonary;  Laterality: N/A;   VIDEO BRONCHOSCOPY WITH ENDOBRONCHIAL ULTRASOUND N/A 09/14/2020   Procedure: VIDEO BRONCHOSCOPY WITH ENDOBRONCHIAL ULTRASOUND;  Surgeon: [name] Dedra CROME, MD;  Location: [facility];  Service: Cardiopulmonary;  Laterality: N/A;    SOCIAL HISTORY: Social History   Socioeconomic History   Marital status: Single    Spouse name: Not on file   Number of children: Not on file   Years of education: Not on file   Highest education level: Not on file  Occupational History   Occupation: unemployed  Tobacco Use   Smoking status: Former    Current packs/day: 0.00    Average packs/day: 2.0 packs/day for 38.0 years (76.0 ttl pk-yrs)    Types: Cigarettes    Start date: 12/19/1976    Quit date: 12/20/2014    Years since quitting: 9.3   Smokeless tobacco: Former   Tobacco comments:    quit smoking in 2017 most smoked 2   Vaping Use  Vaping status: Never Used  Substance and Sexual Activity   Alcohol  use: Not Currently   Drug use: No   Sexual activity: Yes  Other Topics Concern   Not on file  Social History Narrative   Not on file   Social Drivers of Health   Financial Resource Strain: Medium Risk (02/05/2024)   Overall Financial Resource Strain (CARDIA)    Difficulty of Paying Living Expenses: Somewhat hard  Food Insecurity: No Food Insecurity (03/25/2024)   Hunger Vital Sign    Worried About Running Out of Food in the Last Year: Never true    Ran Out of Food in the Last Year: Never true  Transportation Needs: No Transportation Needs (03/25/2024)   PRAPARE - Administrator, Civil Service (Medical): No    Lack of Transportation (Non-Medical): No  Physical Activity:  Inactive (02/05/2024)   Exercise Vital Sign    Days of Exercise per Week: 1 day    Minutes of Exercise per Session: 0 min  Stress: No Stress Concern Present (02/05/2024)   [employer] of Occupational Health - Occupational Stress Questionnaire    Feeling of Stress: Only a little  Social Connections: Socially Isolated (03/25/2024)   Social Connection and Isolation Panel    Frequency of Communication with Friends and Family: Three times a week    Frequency of Social Gatherings with Friends and Family: Once a week    Attends Religious Services: Never    Database administrator or Organizations: No    Attends Banker Meetings: Never    Marital Status: Never married  Intimate Partner Violence: Not At Risk (03/25/2024)   Humiliation, Afraid, Rape, and Kick questionnaire    Fear of Current or Ex-Partner: No    Emotionally Abused: No    Physically Abused: No    Sexually Abused: No    FAMILY HISTORY: Family History  Problem Relation Age of Onset   Diabetes Mother    Hypertension Mother    Heart murmur Mother    Leukemia Mother    Throat cancer Maternal Grandmother    Colon cancer Neg Hx    Breast cancer Neg Hx     ALLERGIES:  has no known allergies.  MEDICATIONS:  Current Outpatient Medications  Medication Sig Dispense Refill   aspirin  EC 81 MG tablet Take 81 mg by mouth daily. Swallow whole.     clopidogrel  (PLAVIX ) 75 MG tablet TAKE 1 TABLET BY MOUTH DAILY 100 tablet 2   levETIRAcetam  (KEPPRA ) 1000 MG tablet Take 1 tablet (1,000 mg total) by mouth 2 (two) times daily. 60 tablet 2   levothyroxine  (SYNTHROID ) 125 MCG tablet Take 1 tablet (125 mcg total) by mouth daily before breakfast. 90 tablet 0   ondansetron  (ZOFRAN ) 8 MG tablet Take 1 tablet (8 mg total) by mouth every 8 (eight) hours as needed for nausea or vomiting. 60 tablet 1   potassium chloride  SA (KLOR-CON  M) 20 MEQ tablet Take 1 tablet (20 mEq total) by mouth daily. 30 tablet 2   rosuvastatin  (CRESTOR ) 40  MG tablet Take 1 tablet (40 mg total) by mouth daily. 90 tablet 3   No current facility-administered medications for this visit.   Facility-Administered Medications Ordered in Other Visits  Medication Dose Route Frequency Provider Last Rate Last Admin   albuterol  (PROVENTIL ) (2.5 MG/3ML) 0.083% nebulizer solution 2.5 mg  2.5 mg Nebulization Once [name] Dedra CROME, MD       heparin  lock flush 100 unit/mL  500 Units Intracatheter  Once PRN Joshua Call, MD         PHYSICAL EXAMINATION: ECOG PERFORMANCE STATUS: 1 - Symptomatic but completely ambulatory Vitals:   05/09/24 0928 05/09/24 0933  BP: (!) 147/76 133/76  Pulse: 88   Resp: 18   Temp: 97.8 F (36.6 C)   SpO2: 100%     Filed Weights   05/09/24 0928  Weight: 187 lb 3.2 oz (84.9 kg)     Physical Exam Constitutional:      General: He is not in acute distress.    Appearance: He is obese.  HENT:     Head: Normocephalic and atraumatic.  Eyes:     General: No scleral icterus. Cardiovascular:     Rate and Rhythm: Normal rate and regular rhythm.  Pulmonary:     Effort: Pulmonary effort is normal. No respiratory distress.     Breath sounds: Normal breath sounds. No wheezing.  Abdominal:     General: There is no distension.     Palpations: Abdomen is soft.     Tenderness: There is no abdominal tenderness.  Musculoskeletal:     Cervical back: Normal range of motion and neck supple.     Comments: Right lower extremity wrapped.   Skin:    General: Skin is warm and dry.     Findings: Bruising present. No erythema.  Neurological:     Mental Status: He is alert and oriented to person, place, and time. Mental status is at baseline.     Cranial Nerves: No cranial nerve deficit.     Motor: No abnormal muscle tone.  Psychiatric:        Mood and Affect: Mood and affect normal.         LABORATORY DATA:  I have reviewed the data as listed    Latest Ref Rng & Units 05/09/2024    8:55 AM 04/25/2024    8:30 AM 04/12/2024     8:48 AM  CBC  WBC 4.0 - 10.5 K/uL 6.3  4.5  4.6   Hemoglobin 13.0 - 17.0 g/dL 88.9  88.7  88.8   Hematocrit 39.0 - 52.0 % 34.8  35.3  35.2   Platelets 150 - 400 K/uL 244  281  285       Latest Ref Rng & Units 05/09/2024    8:55 AM 04/25/2024    8:30 AM 04/12/2024    8:48 AM  CMP  Glucose 70 - 99 mg/dL 874  881  836   BUN 8 - 23 mg/dL 12  7  8    Creatinine 0.61 - 1.24 mg/dL 9.18  9.12  8.91   Sodium 135 - 145 mmol/L 139  140  139   Potassium 3.5 - 5.1 mmol/L 3.6  3.8  3.3   Chloride 98 - 111 mmol/L 107  107  106   CO2 22 - 32 mmol/L 23  24  21    Calcium  8.9 - 10.3 mg/dL 9.1  9.3  8.7   Total Protein 6.5 - 8.1 g/dL 6.6  6.7  6.2   Total Bilirubin 0.0 - 1.2 mg/dL 0.5  0.6  0.6   Alkaline Phos 38 - 126 U/L 60  69  75   AST 15 - 41 U/L 29  30  32   ALT 0 - 44 U/L 16  13  8       RADIOGRAPHIC STUDIES: I have personally reviewed the radiological images as listed and agreed with the findings in the report. VAS US  LOWER EXTREMITY ARTERIAL  DUPLEX Result Date: 04/22/2024 LOWER EXTREMITY ARTERIAL DUPLEX STUDY Patient Name:  AISON MALVEAUX  Date of Exam:   04/21/2024 Medical Rec #: [id number]            Accession #:    [id number] Date of Birth: 02/07/1956           Patient Gender: M Patient Age:   21 years Exam Location:   [facility] Procedure:      VAS US  LOWER EXTREMITY ARTERIAL DUPLEX Referring Phys: [name] --------------------------------------------------------------------------------  Indications: Ulceration, peripheral artery disease, and Routine f/u. High Risk Factors: Hyperlipidemia. Other Factors: ASO s/p multiple interventions and bypass.                11/15/2019 Right thrombectomy of Fempop insitu graft with added                stent throughout.                 12/01/2019: Aortogram and Selctive Right Lower Extremity                Angiogram. 6 mg of TPA delivered in a lysis catheterfrom the                Origin of the Right Femoral -Popliteal bypass down the  proximal                ATA with placement of the lysis.                 12/02/2019: PTA of the Right Posterior Tibial Artery. PTA of the                Right Peroneal Artery. Mechanical Thrombectomy of the Right                Femoral -Popliteal Bypass graft. Mechanical Thrombectomy of the                Right Tibioperoneal Trunk and Posterior Tibial Artery.                 09/25/2021: Right Lower Extremity Angiography third order                catheter placement. PTA of the proximal peroneal and                tibioperoneal trunk to 2.5 mm with an ultra score balloon. PTA of                the Femoral Artery and the proximal anstomosis of the bypass                graft to 4 mm with an ultra score balloon. Administration of 5 mg                of tpa throughout the entire bypass and in the peroneal Artery.                Initiation of continuous tpa infusion Right lower Extremity for                limb salvage. Insertion of a triple lumen catheter Left CFV with                US  and fluoroscopic guidance.                 09/26/2021: Right LE Angiogram. PTA of the Right Posterior Tibial  Artery with 2.5 mm diameter by 30 cm length angioplasty balloon.                PTA of the distal bypass anastomosis and distal bypass graft                analogous to the Popliteal artery with 4 mm diameter by 8 cm                length Lutonix drug coated angioplasty balloon.                04/08/2022 right thrombectomy of the Deep profunda.  Vascular Interventions: 03/25/2024 Right lower extremity angiography                         3. Atherectomy right common femoral artery,                         femoral-popliteal bypass and distal popliteal arteries                         with the [place] catheter                         4. Ultrasound-guided access to the left common femoral                         artery                         5. Mechanical thrombectomy using the penumbra lightening                          bolt 7 catheter                         6. Percutaneous kissing stents in the profunda femoris                         and origin of the femoral-popliteal bypass with a 9 x 40                         life star stent in the profunda and a 7 x 100 life stent                         in the femoral-popliteal bypass                         7. Percutaneous angioplasty and stent placement distal                         bypass and popliteal artery with a 6 mm x 40 mm life                         stent.                         8. Percutaneous transluminal angioplasty and stent                         placement right tibioperoneal  trunk and posterior tibial                         artery using 3 Esprit stents postdilated to 4 mm                         proximally and 3 mm distally.                          Hx of multiple right lower extremity PTA's/stents.                         09/10/2016 Right femoral to below the knee popliteal                         artery bypass graft placement. 5/01 Right bypass graft &                         mid popliteal artery PTA & coil embolectomy of large                         branch of bypass graft. 12/09/16 Right distal bypass                         graft PTA/stent and right posterior tibial artery PTA.                         03/23/2020 Rt PTA thrombectomy. Rt Profunda fem artery to                         PTA bypass. Current ABI:            rt = 1.13 Performing Technologist: Jerel Croak RVT  Examination Guidelines: A complete evaluation includes B-mode imaging, spectral Doppler, color Doppler, and power Doppler as needed of all accessible portions of each vessel. Bilateral testing is considered an integral part of a complete examination. Limited examinations for reoccurring indications may be performed as noted.  +-----------+--------+-----+--------+----------+--------+ RIGHT      PSV cm/sRatioStenosisWaveform  Comments +-----------+--------+-----+--------+----------+--------+ CFA  Mid    54                   biphasic           +-----------+--------+-----+--------+----------+--------+ DFA        189                  triphasic          +-----------+--------+-----+--------+----------+--------+ SFA Prox                                  BPG      +-----------+--------+-----+--------+----------+--------+ SFA Mid                                   BPG      +-----------+--------+-----+--------+----------+--------+ SFA Distal                                BPG      +-----------+--------+-----+--------+----------+--------+ POP Distal  38                   biphasic           +-----------+--------+-----+--------+----------+--------+ ATA Distal 19                   monophasic         +-----------+--------+-----+--------+----------+--------+ PTA Distal 48                   triphasic          +-----------+--------+-----+--------+----------+--------+ PERO Distal19                   monophasic         +-----------+--------+-----+--------+----------+--------+  Right Graft #1: FemPop +------------------+--------+--------+---------+--------+                   PSV cm/sStenosisWaveform Comments +------------------+--------+--------+---------+--------+ Inflow            54              biphasic          +------------------+--------+--------+---------+--------+ Prox Anastomosis  52              biphasic          +------------------+--------+--------+---------+--------+ Proximal Graft    64              biphasic          +------------------+--------+--------+---------+--------+ Mid Graft         63              triphasic         +------------------+--------+--------+---------+--------+ Distal Graft      74              biphasic          +------------------+--------+--------+---------+--------+ Distal Anastomosis58              triphasic         +------------------+--------+--------+---------+--------+ Outflow            38              biphasic          +------------------+--------+--------+---------+--------+   Summary: Right: Patent FemPop BPG and new stents throughout. Strong triphasic flow to distal PTA. NL abi  See table(s) above for measurements and observations. Electronically signed by [name] MD on 04/22/2024 at 9:35:02 AM.    Final    VAS US  ABI WITH/WO TBI Result Date: 04/22/2024  LOWER EXTREMITY DOPPLER STUDY Patient Name:  AMAREON PHUNG  Date of Exam:   04/21/2024 Medical Rec #: [id number]            Accession #:    [id number] Date of Birth: [birth year]           Patient Gender: M Patient Age:   38 years Exam Location:  [facility] Procedure:      VAS US  ABI WITH/WO TBI Referring Phys: [name] --------------------------------------------------------------------------------  Indications: Ulceration, peripheral artery disease, and Routine f/u. High Risk Factors: Hyperlipidemia. Other Factors: ASO s/p multiple interventions and bypass.                11/15/2019 Right thrombectomy of Fempop insitu graft with added                stent throughout.                 12/01/2019: Aortogram and Selctive Right Lower Extremity  Angiogram. 6 mg of TPA delivered in a lysis catheterfrom the                Origin of the Right Femoral -Popliteal bypass down the proximal                ATA with placement of the lysis.                 12/02/2019: PTA of the Right Posterior Tibial Artery. PTA of the                Right Peroneal Artery. Mechanical Thrombectomy of the Right                Femoral -Popliteal Bypass graft. Mechanical Thrombectomy of the                Right Tibioperoneal Trunk and Posterior Tibial Artery.                 09/25/2021: Right Lower Extremity Angiography third order                catheter placement. PTA of the proximal peroneal and                tibioperoneal trunk to 2.5 mm with an ultra score balloon. PTA of                the Femoral Artery and the  proximal anstomosis of the bypass                graft to 4 mm with an ultra score balloon. Administration of 5 mg                of tpa throughout the entire bypass and in the peroneal Artery.                Initiation of continuous tpa infusion Right lower Extremity for                limb salvage. Insertion of a triple lumen catheter Left CFV with                US  and fluoroscopic guidance.                 09/26/2021: Right LE Angiogram. PTA of the Right Posterior Tibial                Artery with 2.5 mm diameter by 30 cm length angioplasty balloon.                PTA of the distal bypass anastomosis and distal bypass graft                analogous to the Popliteal artery with 4 mm diameter by 8 cm                length Lutonix drug coated angioplasty balloon.                04/08/2022 right thrombectomy of the Deep profunda.  Vascular Interventions: 03/25/2024 Right lower extremity angiography                         3. Atherectomy right common femoral artery,                         femoral-popliteal bypass and distal popliteal arteries  with the [place] Rex catheter                         4. Ultrasound-guided access to the left common femoral                         artery                         5. Mechanical thrombectomy using the penumbra lightening                         bolt 7 catheter                         6. Percutaneous kissing stents in the profunda femoris                         and origin of the femoral-popliteal bypass with a 9 x 40                         life star stent in the profunda and a 7 x 100 life stent                         in the femoral-popliteal bypass                         7. Percutaneous angioplasty and stent placement distal                         bypass and popliteal artery with a 6 mm x 40 mm life                         stent.                         8. Percutaneous transluminal angioplasty and stent                         placement right  tibioperoneal trunk and posterior tibial                         artery using 3 Esprit stents postdilated to 4 mm                         proximally and 3 mm distally.                          Hx of multiple right lower extremity PTA's/stents.                         09/10/2016 Right femoral to below the knee popliteal                         artery bypass graft placement. 5/01 Right bypass graft &                         mid popliteal artery PTA & coil embolectomy of large  branch of bypass graft. 12/09/16 Right distal bypass                         graft PTA/stent and right posterior tibial artery PTA.                         03/23/2020 Rt PTA thrombectomy. Rt Profunda fem artery to                         PTA bypass. Performing Technologist: Jerel Croak RVT  Examination Guidelines: A complete evaluation includes at minimum, Doppler waveform signals and systolic blood pressure reading at the level of bilateral brachial, anterior tibial, and posterior tibial arteries, when vessel segments are accessible. Bilateral testing is considered an integral part of a complete examination. Photoelectric Plethysmograph (PPG) waveforms and toe systolic pressure readings are included as required and additional duplex testing as needed. Limited examinations for reoccurring indications may be performed as noted.  ABI Findings: +---------+------------------+-----+--------+--------+ Right    Rt Pressure (mmHg)IndexWaveformComment  +---------+------------------+-----+--------+--------+ Brachial 128                                     +---------+------------------+-----+--------+--------+ PTA      140               1.08 biphasic         +---------+------------------+-----+--------+--------+ DP       147               1.13 biphasic         +---------+------------------+-----+--------+--------+ Great Toe145               1.12 Normal            +---------+------------------+-----+--------+--------+ +---------+------------------+-----+---------+-------+ Left     Lt Pressure (mmHg)IndexWaveform Comment +---------+------------------+-----+---------+-------+ Brachial 130                                     +---------+------------------+-----+---------+-------+ PTA      145               1.12 triphasic        +---------+------------------+-----+---------+-------+ DP       144               1.11 triphasic        +---------+------------------+-----+---------+-------+ Great Toe152               1.17 Normal           +---------+------------------+-----+---------+-------+ +-------+-----------+-----------+------------+------------+ ABI/TBIToday's ABIToday's TBIPrevious ABIPrevious TBI +-------+-----------+-----------+------------+------------+ Right  1.13       1.12       .37         .26          +-------+-----------+-----------+------------+------------+ Left   1.12       1.17       1.13        .75          +-------+-----------+-----------+------------+------------+  Right TBIs and ABIs appear increased compared to prior study on 02/2024.  Summary: Right: Resting right ankle-brachial index is within normal range. The right toe-brachial index is normal.  Left: Resting left ankle-brachial index is within normal range. The left toe-brachial index is normal.  *See table(s) above for measurements and observations.  Electronically signed by [name] MD on 04/22/2024 at 9:34:54 AM.    Final    MR BRAIN W WO CONTRAST Result Date: 04/11/2024 CLINICAL DATA:  Brain/CNS neoplasm, assess treatment response EXAM: MRI HEAD WITHOUT AND WITH CONTRAST TECHNIQUE: Multiplanar, multiecho pulse sequences of the brain and surrounding structures were obtained without and with intravenous contrast. CONTRAST:  9 mL of Vueway  COMPARISON:  MRI head January 04, 2024. FINDINGS: Brain: Slight increase in size of a centrally necrotic 9 mm lesion  in the right occipital lobe. Similar versus slightly decreased necrotic left parietal lesion which measures approximately 2.1 x 1.9 cm. Similar versus slightly improved/decreased 1.1 x 1.1 cm lesion in the left frontal operculum. Unchanged punctate focus of enhancement in the left cerebellum (series 14, image 42). Edema surrounding these lesions is not substantially changed. No progressive mass effect. No midline shift. No evidence of acute infarct, acute hemorrhage, or hydrocephalus. Vascular: Normal flow voids. Skull and upper cervical spine: Normal marrow signal. Sinuses/Orbits: Clear sinuses.  No acute findings. IMPRESSION: 1. 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 This could represent treatment related change or progression. Recommend attention on follow-up. 2. 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 3. Unchanged punctate lesion in the left cerebellum, most likely a vessel but warranting attention on follow-up. 4. 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 Recommend ENT follow-up if not previously evaluated. Electronically Signed   By: Gilmore GORMAN Molt M.D.   On: 04/11/2024 13:00   PERIPHERAL VASCULAR CATHETERIZATION Result Date: 03/25/2024 See surgical note for result.  PERIPHERAL VASCULAR CATHETERIZATION Result Date: 03/04/2024 See surgical note for result.  CT CHEST ABDOMEN PELVIS W CONTRAST Result Date: 03/01/2024 CLINICAL DATA:  Lung cancer. Assess treatment response. * Tracking Code: BO * EXAM: CT CHEST, ABDOMEN, AND PELVIS WITH CONTRAST TECHNIQUE: Multidetector CT imaging of the chest, abdomen and pelvis was performed following the standard protocol during bolus administration of intravenous contrast. RADIATION DOSE REDUCTION: This exam was performed according to the departmental dose-optimization program which includes automated exposure  control, adjustment of the mA and/or kV according to patient size and/or use of iterative reconstruction technique. CONTRAST:  OMNIPAQUE  IOHEXOL  300 MG/ML  SOLN COMPARISON:  CT 526 1,025 com PET-CT 2000 FINDINGS: CT CHEST FINDINGS Cardiovascular: Port in the anterior chest wall with tip in distal SVC. No significant vascular findings. Normal heart size. No pericardial effusion. Mediastinum/Nodes: No axillary or supraclavicular adenopathy. No mediastinal or hilar adenopathy. No pericardial fluid. Esophagus normal. Lungs/Pleura: Post RIGHT upper lobe resection. RIGHT suprahilar peribronchial thickening unchanged from prior. No new nodularity the RIGHT lung. LEFT lung clear. Musculoskeletal: No aggressive osseous lesion. CT ABDOMEN AND PELVIS FINDINGS Hepatobiliary: No focal hepatic lesion. No biliary ductal dilatation. Gallbladder is normal. Common bile duct is normal. Pancreas: Pancreas is normal. No ductal dilatation. No pancreatic inflammation. Spleen: Normal spleen Adrenals/urinary tract: Adrenal glands and kidneys are normal. The ureters and bladder normal. Stomach/Bowel: Stomach, small bowel, appendix, and cecum are normal. Multiple diverticula of the descending colon and sigmoid colon without acute inflammation. Vascular/Lymphatic: Abdominal aorta is normal caliber. There is no retroperitoneal or periportal lymphadenopathy. No pelvic lymphadenopathy. Reproductive: Unremarkable Other: No free fluid. Musculoskeletal: No aggressive osseous lesion. IMPRESSION: CHEST: 1. Post RIGHT upper lobe resection. 2. No evidence of lung cancer recurrence. 3. No mediastinal adenopathy. PELVIS: 1. No evidence of metastatic disease in the abdomen pelvis. 2. No skeletal metastasis.  Electronically Signed   By: Jackquline Boxer M.D.   On: 03/01/2024 16:37   VAS US  ABI WITH/WO TBI Result Date: 02/29/2024  LOWER EXTREMITY DOPPLER STUDY Patient Name:  OLE LAFON  Date of Exam:   02/25/2024 Medical Rec #: [id number]             Accession #:    [id number] Date of Birth: July 11, 1956           Patient Gender: M Patient Age:   34 years Exam Location:  [facility] Procedure:      VAS US  ABI WITH/WO TBI Referring Phys: --------------------------------------------------------------------------------  Indications: Ulceration, peripheral artery disease, and Routine f/u. Patient              reports no claudication or lower extremity pain. High Risk Factors: Hyperlipidemia, past history of smoking. Other Factors: ASO s/p multiple interventions and bypass.                11/15/2019 Right thrombectomy of Fempop insitu graft with added                stent throughout.                 12/01/2019: Aortogram and Selective Right Lower Extremity                Angiogram. 6 mg of TPA delivered in a lysis catheter the Origin                of the Right Femoral -Popliteal bypass down the proximal ATA with                placement of the lysis.                 12/02/2019: PTA of the Right Posterior Tibial Artery. PTA of the                Right Peroneal Artery. Mechanical Thrombectomy of the Right                Femoral -Popliteal Bypass graft. Mechanical Thrombectomy of the                Right Tibioperoneal Trunk and Posterior Tibial Artery.                 09/25/2021: Right Lower Extremity Angiography third order                catheter placement. PTA of the proximal peroneal and                tibioperoneal trunk to 2.5 mm with an ultra score balloon. PTA of                the Femoral Artery and the proximal anastomosis of the bypass                graft to 4 mm with an ultra score balloon. Administration of 5 mg                of tpa throughout the entire bypass and in the peroneal Artery.                Initiation of continuous tpa infusion Right lower Extremity for                limb salvage. Insertion of a triple lumen catheter Left CFV with  US  and fluoroscopic guidance.                 09/26/2021: Right LE Angiogram. PTA  of the Right Posterior Tibial                Artery with 2.5 mm diameter by 30 cm length angioplasty balloon.                PTA of the distal bypass anastomosis and distal bypass graft                analogous to the Popliteal artery with 4 mm diameter by 8 cm                length Lutonix drug coated angioplasty balloon.                04/08/2022 right thrombectomy of the Deep profunda.  Vascular Interventions: Hx of multiple right lower extremity PTA's/stents.                         09/10/2016 Right femoral to below the knee popliteal                         artery bypass graft placement. 5/01 Right bypass graft &                         mid popliteal artery PTA & coil embolectomy of large                         branch of bypass graft. 12/09/16 Right distal bypass                         graft PTA/stent and right posterior tibial artery PTA.                         03/23/2020 Rt PTA thrombectomy. Rt Profunda fem artery to                         PTA bypass. Performing Technologist: Donnice Charnley RVT  Examination Guidelines: A complete evaluation includes at minimum, Doppler waveform signals and systolic blood pressure reading at the level of bilateral brachial, anterior tibial, and posterior tibial arteries, when vessel segments are accessible. Bilateral testing is considered an integral part of a complete examination. Photoelectric Plethysmograph (PPG) waveforms and toe systolic pressure readings are included as required and additional duplex testing as needed. Limited examinations for reoccurring indications may be performed as noted.  ABI Findings: +---------+------------------+-----+----------+--------+ Right    Rt Pressure (mmHg)IndexWaveform  Comment  +---------+------------------+-----+----------+--------+ Brachial 167                                       +---------+------------------+-----+----------+--------+ PTA      61                0.37 monophasic          +---------+------------------+-----+----------+--------+ DP       54                0.32 monophasic         +---------+------------------+-----+----------+--------+ Burnetta Moloney  0.26                    +---------+------------------+-----+----------+--------+ +---------+------------------+-----+---------+-------+ Left     Lt Pressure (mmHg)IndexWaveform Comment +---------+------------------+-----+---------+-------+ Brachial 164                                     +---------+------------------+-----+---------+-------+ PTA      165               0.99 biphasic         +---------+------------------+-----+---------+-------+ DP       189               1.13 triphasic        +---------+------------------+-----+---------+-------+ Great Toe125               0.75                  +---------+------------------+-----+---------+-------+ +-------+-----------+-----------+------------+------------+ ABI/TBIToday's ABIToday's TBIPrevious ABIPrevious TBI +-------+-----------+-----------+------------+------------+ Right  0.37       0.26       0.44        0.24         +-------+-----------+-----------+------------+------------+ Left   1.13       0.75       1.13        0.61         +-------+-----------+-----------+------------+------------+ Bilateral ABIs appear essentially unchanged compared to prior study on 07/09/2023.  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 The right toe-brachial index is abnormal. Left: Resting left ankle-brachial index is within normal range. The left toe-brachial index is normal. *See table(s) above for measurements and observations.  Electronically signed by [name] MD on 02/29/2024 at 7:39:27 AM.    Final

## 2024-05-09 NOTE — Assessment & Plan Note (Signed)
 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  Labs are reviewed and discussed with patient. Proceed with Durvalumab 

## 2024-05-10 ENCOUNTER — Inpatient Hospital Stay

## 2024-05-10 ENCOUNTER — Ambulatory Visit: Payer: Self-pay | Admitting: Oncology

## 2024-05-10 LAB — T4: T4, Total: 9 ug/dL (ref 4.5–12.0)

## 2024-05-10 NOTE — Progress Notes (Signed)
 Nutrition Follow-up:  Patient with stage IV lung SCC. Receiving durvalumab  and followed by Dr Babara.  Spoke with patient via phone.  Reports that appetite has improved a little bit.  Drinking ensure max protein or ensure clear about 1 time a day.  Yesterday ate 2 sausage biscuits and few tater tots.  Has been eating some cashews and peanuts.  Also cooked cube steak recently and sausage link with mustard on loaf bread.  Says that nausea is better.     Medications: reviewed  Labs: reviewed  Anthropometrics:   Weight 187 lb 3.2 oz on 10/20 184 lb 9.6 oz on 9/23 189 lb 11.2 oz on 9/8 189 lb on 8/28 222 lb 3.2 oz on 5/14   NUTRITION DIAGNOSIS: Inadequate oral intake slight improvement   INTERVENTION:  Encouraged 350 calorie shake if able to tolerate Continue nuts for added calories and protein     MONITORING, EVALUATION, GOAL: weight trends, intake   NEXT VISIT: Tuesday, Nov 18 phone call  Margareth Kanner B. Dasie SOLON, CSO, LDN Registered Dietitian [phone]

## 2024-05-12 ENCOUNTER — Other Ambulatory Visit: Payer: Self-pay

## 2024-05-23 ENCOUNTER — Inpatient Hospital Stay: Attending: Oncology

## 2024-05-23 ENCOUNTER — Encounter: Payer: Self-pay | Admitting: Oncology

## 2024-05-23 ENCOUNTER — Inpatient Hospital Stay

## 2024-05-23 ENCOUNTER — Inpatient Hospital Stay: Attending: Oncology | Admitting: Oncology

## 2024-05-23 VITALS — BP 115/81 | HR 99 | Temp 98.3°F | Resp 18 | Wt 186.1 lb

## 2024-05-23 VITALS — BP 118/64 | HR 82 | Resp 18

## 2024-05-23 DIAGNOSIS — Z7982 Long term (current) use of aspirin: Secondary | ICD-10-CM | POA: Diagnosis not present

## 2024-05-23 DIAGNOSIS — E032 Hypothyroidism due to medicaments and other exogenous substances: Secondary | ICD-10-CM | POA: Insufficient documentation

## 2024-05-23 DIAGNOSIS — I739 Peripheral vascular disease, unspecified: Secondary | ICD-10-CM | POA: Diagnosis not present

## 2024-05-23 DIAGNOSIS — Z5112 Encounter for antineoplastic immunotherapy: Secondary | ICD-10-CM | POA: Diagnosis not present

## 2024-05-23 DIAGNOSIS — C3411 Malignant neoplasm of upper lobe, right bronchus or lung: Secondary | ICD-10-CM | POA: Insufficient documentation

## 2024-05-23 DIAGNOSIS — E876 Hypokalemia: Secondary | ICD-10-CM

## 2024-05-23 DIAGNOSIS — Z79899 Other long term (current) drug therapy: Secondary | ICD-10-CM | POA: Diagnosis not present

## 2024-05-23 DIAGNOSIS — C7931 Secondary malignant neoplasm of brain: Secondary | ICD-10-CM | POA: Insufficient documentation

## 2024-05-23 DIAGNOSIS — C3491 Malignant neoplasm of unspecified part of right bronchus or lung: Secondary | ICD-10-CM

## 2024-05-23 DIAGNOSIS — R569 Unspecified convulsions: Secondary | ICD-10-CM | POA: Diagnosis not present

## 2024-05-23 DIAGNOSIS — Z79890 Hormone replacement therapy (postmenopausal): Secondary | ICD-10-CM | POA: Insufficient documentation

## 2024-05-23 DIAGNOSIS — Z7902 Long term (current) use of antithrombotics/antiplatelets: Secondary | ICD-10-CM | POA: Insufficient documentation

## 2024-05-23 LAB — COMPREHENSIVE METABOLIC PANEL WITH GFR
ALT: 14 U/L (ref 0–44)
AST: 24 U/L (ref 15–41)
Albumin: 3.9 g/dL (ref 3.5–5.0)
Alkaline Phosphatase: 61 U/L (ref 38–126)
Anion gap: 11 (ref 5–15)
BUN: 15 mg/dL (ref 8–23)
CO2: 22 mmol/L (ref 22–32)
Calcium: 9 mg/dL (ref 8.9–10.3)
Chloride: 106 mmol/L (ref 98–111)
Creatinine, Ser: 0.91 mg/dL (ref 0.61–1.24)
GFR, Estimated: >60 mL/min (ref >60)
Glucose, Bld: 125 mg/dL — ABNORMAL HIGH (ref 70–99)
Potassium: 3.6 mmol/L (ref 3.5–5.1)
Sodium: 139 mmol/L (ref 135–145)
Total Bilirubin: 0.8 mg/dL (ref 0.0–1.2)
Total Protein: 7.2 g/dL (ref 6.5–8.1)

## 2024-05-23 LAB — CBC WITH DIFFERENTIAL/PLATELET
Abs Immature Granulocytes: 0.01 K/uL (ref 0.00–0.07)
Basophils Absolute: 0.1 K/uL (ref 0.0–0.1)
Basophils Relative: 1 %
Eosinophils Absolute: 0.3 K/uL (ref 0.0–0.5)
Eosinophils Relative: 6 %
HCT: 35.9 % — ABNORMAL LOW (ref 39.0–52.0)
Hemoglobin: 11.2 g/dL — ABNORMAL LOW (ref 13.0–17.0)
Immature Granulocytes: 0 %
Lymphocytes Relative: 18 %
Lymphs Abs: 1.1 K/uL (ref 0.7–4.0)
MCH: 23.9 pg — ABNORMAL LOW (ref 26.0–34.0)
MCHC: 31.2 g/dL (ref 30.0–36.0)
MCV: 76.7 fL — ABNORMAL LOW (ref 80.0–100.0)
Monocytes Absolute: 0.6 K/uL (ref 0.1–1.0)
Monocytes Relative: 10 %
Neutro Abs: 3.7 K/uL (ref 1.7–7.7)
Neutrophils Relative %: 65 %
Platelets: 248 K/uL (ref 150–400)
RBC: 4.68 MIL/uL (ref 4.22–5.81)
RDW: 16.8 % — ABNORMAL HIGH (ref 11.5–15.5)
WBC: 5.7 K/uL (ref 4.0–10.5)
nRBC: 0 % (ref 0.0–0.2)

## 2024-05-23 MED ORDER — SODIUM CHLORIDE 0.9 % IV SOLN
Freq: Once | INTRAVENOUS | Status: AC
  Filled 2024-05-23: qty 250

## 2024-05-23 MED ORDER — SODIUM CHLORIDE 0.9 % IV SOLN
10.0000 mg/kg | Freq: Once | INTRAVENOUS | Status: AC
  Administered 2024-05-23: 860 mg via INTRAVENOUS
  Filled 2024-05-23: qty 7.2

## 2024-05-23 NOTE — Assessment & Plan Note (Signed)
 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  Labs are reviewed and discussed with patient. Proceed with Durvalumab 

## 2024-05-23 NOTE — Progress Notes (Signed)
 Hematology/Oncology Progress note [phone] Fax:([phone]) [phone]    CHIEF COMPLAINTS/REASON FOR VISIT:  Follow up for lung cancer  ASSESSMENT & PLAN:   Cancer Staging  Squamous cell carcinoma of right lung (HCC) Staging form: Lung, AJCC 8th Edition - Clinical stage from 09/20/2020: Stage IV (cT1b, cN2, cM1) - Signed by [name], MD on 12/13/2021   Squamous cell carcinoma of right lung (HCC) Durvalumab  was previously held due to pneumonitis.  Labs are reviewed and discussed with patient. Proceed with Durvalumab     Encounter for antineoplastic immunotherapy Durvalumab  treatment as planned.   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 He is asymptomatic.  Follow up  with [name].   Seizure (HCC) Follow up with [name].  Continue keppra      Follow up  2 weeks lab MD Durvalumab   All questions were answered. The patient knows to call the clinic with any problems, questions or concerns.  Call Babara, MD, PhD [facility] Hematology Oncology 05/23/2024       HISTORY OF PRESENTING ILLNESS:  Joshua [name] is a 68 y.o. male who has above history reviewed by me today presents for follow up visit for Stage IV lung squamous cell carcinoma. Oncology History  Squamous cell carcinoma of right lung (HCC)  09/18/2020 Imaging   PET scan showed right upper lobe nodule maximal SUV 11.5.  Hypermetabolic cluster right paratracheal adenopathy with maximum SUV up to 12.  No findings of metastatic disease to the neck/abdomen/pelvis or skeleton.   09/20/2020 Initial Diagnosis   Squamous cell carcinoma of right lung (HCC) NGS showed PD-L1 TPS 95%, LRP1B S997fs, PIK3CA E545K, RB1 c1422-2A>T, RIT1 T83_ A84del, STK11 P281fs, TPS53 R248W, TMB 19.75mut/mb, MS stable.     09/20/2020 Cancer Staging   Staging form: Lung, AJCC 8th Edition - Clinical stage from 09/20/2020: Stage IV (cT1b, cN2, cM1) - Signed by [name], MD on 12/13/2021   10/03/2020 Imaging   MRI brain with and without contrast showed no evidence of intracranial metastatic disease. Well-defined round lesion within the left parotid tail measuring approximately 2.3 x 1.8 x 1.8 cm   10/09/2020 - 11/27/2020 Radiation Therapy   concurrent chemoradiation   10/10/2020 - 10/31/2020 Chemotherapy   10/10/2020 concurrent chemoradiation.  carboplatin  AUC of 2 and Taxol  45 mg/m2 x 4.  Additional chemotherapy was held due to thrombocytopenia, neutropenia       12/31/2020 - 03/21/2022 Chemotherapy   Maintenance  Durvalumab  q14d      12/31/2020 -  Chemotherapy   Patient is on Treatment Plan : LUNG Durvalumab  (10) q14d     03/28/2021 Imaging   CT without contrast showed new linear right perihilar consolidation with associated traction bronchiectasis.  Likely postradiation changes.  Additional new peribronchial vascular/nodular groundglass opacities with areas of consolidation or seen primarily in the right upper and lower lobes, more distant from expected radiation fields. 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  Per my discussion with pulmonology Dr. 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     05/09/2021 Imaging   Repeat CT chest without contrast showed evolving radiation changes in the right perihilar region likely accounting for interval increase to thickening of the posterior wall of the right upper lobe bronchus.  The more peripheral right lung groundglass opacities seen on most recent study have generally improved with exception of one peripherally right upper lobe which appears new.  These are likely inflammatory.  No findings highly suspicious for local recurrence.   05/30/2021  - 01/10/2022 Chemotherapy   Durvalumab  q14d     10/07/2021 Imaging   MRI brain with and without contrast showed 2 contrast-enhancing lesions, most consistent with metastatic disease.  Largest lesion is in the left frontal lobe and measures up to 1.3 cm with a large amount of surrounding edema and 9 mm rightward midline shift.  The other enhancing lesion was 4 mm in the right occipital lobe with a small amount of surrounding edema.  No acute or chronic hemorrhage.   10/07/2021 Progression   Headache after colonoscopy, progressive difficulty using her right arm.  Progressively worsening of speech. Stage IV lung cancer with brain metastasis.  started on Dexamethasone .  He was seen by neurosurgery and radiation oncology.  Recommendation is to proceed with SRS.  Patient was discharged with dexamethasone    10/16/2021 Imaging   PET scan showed postradiation changes in the right supra hilar lung without evidence of local recurrence.  No evidence of lung cancer recurrence or metastatic disease.  Single focus of intense metabolic activity associated with posterior left 11th rib likely secondary to recent fall/posttraumatic metabolic changes.  Frontal  uptake in the left frontal lobe corresponding to the known metastatic lesion in the brain   10/29/2021 -  Radiation Therapy   Brain Radiation [facility]   12/10/2021 Imaging   Brain MRI: Continued interval decrease in size of a left frontal lobe metastasis, now measuring 10 mm. Moderate surrounding edema has also decreased.  3-4 mm metastasis within the right occipital lobe, not significantly changed in size. There is now a small focus of central non enhancement within this lesion. Increased edema. 3 mm enhancing metastasis within the high left parietal lobe. In retrospect, this was likely present as a subtle punctate focus of enhancement on the prior brain MRI of 10/22/2021.   01/18/2022 Imaging   CT chest abdomen pelvis w contrast 1. Stable post treatment changes in the  right upper lobe medially with dense radiation fibrosis but no findings to suggest local recurrent tumor. 2. Significant progression of ill-defined ground-glass opacity, interstitial thickening and airspace nodularity in the right upper lobe and upper aspect of the right lower lobe. Findings could reflect an inflammatory or atypical infectious process, drug induced pneumonitis or interstitial spread of tumor.3. No mediastinal or hilar mass or adenopathy. 4. No findings for abdominal/pelvic metastatic disease or osseous metastatic disease.   02/19/2022 Imaging   MRI brain w wo contrast showed Three metastatic deposits in the brain appear smaller. Decreased edema in the left parietal lobe. No new lesions    04/04/2022 Imaging   CT chest w contrast 1. Stable radiation changes in the right perihilar region without evidence of local recurrence. 2. Interval improvement in the patchy ground-glass opacities previously demonstrated in both lungs, most consistent with a resolving infectious or inflammatory process (including immunotherapy-related pneumonitis). 3. No evidence of metastatic disease.   05/14/2022 Miscellaneous   He had a mechanical fall and fractured his left elbow. xrays showed a left elbow fracture  05/26/22 seen by orthopedic surgeon, cast applied.    05/27/2022 Imaging   MRI brain w wo contrast 1. All three known brain metastases have enlarged since August, with increasing regional edema. And there is a small new left anterior cerebellar 4-5 mm metastasis (  was questionably punctate on the May MRI). This constellation could reflect a combination of True Progression (Left Cerebellar lesion) and pseudoprogression/radionecrosis (3 existing mets).    2. No significant intracranial mass effect. 3. Underlying moderate cerebral white matter signal changes.   05/30/2022 Imaging   CT chest abdomen pelvis w contrast  1. Stable examination demonstrating chronic postradiation mass-like fibrosis in  the right lung with resolving postradiation pneumonitis in the adjacent lung parenchyma. There is a stable small right pleural effusion, but no definitive findings to suggest residual or recurrent disease on today's examination. 2. No signs of metastatic disease in the abdomen or pelvis.  3. Aortic atherosclerosis, in addition to two-vessel coronary artery disease. Please note that although the presence of coronary artery calcium  documents the presence of coronary artery disease, the severity of this disease and any potential stenosis cannot be assessed on this non-gated CT examination. Assessment for potential risk factor modification, dietary therapy or pharmacologic therapy may be warranted, if clinically indicated. 4. Hepatic steatosis. 5. Colonic diverticulosis without evidence of acute diverticulitis at this time. 6. Incompletely imaged but apparently occluded vascular graft in the superior aspect of the right anterior thigh. Referral to vascular specialist for further clinical evaluation should be considered if clinically appropriate. 7. Additional incidental findings, as above.   07/22/2022 Imaging   MRI brain  1. No new or enlarging lesions. No intracranial disease progression. 2. Decreased size of treated lesions of the left frontoparietal junction and left postcentral gyrus. 3. Unchanged lesions in the left cerebellum and right occipital lobe.   10/15/2022 Imaging   MRI brain w wo contrast showed Interval increase in size of multiple intraparenchymal lesions with worsened surrounding vasogenic edema. 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 No new lesions   11/05/2022 Imaging   MRI brain w wo contrast  1. Likely slight increase and right occipital and left cerebellar lesions. Otherwise, unchanged metastases, as above. No new lesions identified. 2. Partially imaged large probable lymph node in the left upper  neck, apparent on prior PET CT.   11/13/2022 -  Radiation Therapy   Each of the sites were treated with SRS/SBRT/SRT-IMRT technique to 20 Gy in 1 fraction to each of the following sites: PTV_2_LCerebel_59mm PTV_3_RCerebel_70mm PTV_4_ROccip_10mm PTV_5_LParietal_61mm   11/27/2022 Imaging   CT chest abdomen pelvis w contrast showed  Improving bilateral scattered interstitial and ground-glass opacities. Small amount of residual areas in the extreme left lower lobe inferiorly. This specific area is slightly increased from previous but overall the amount of opacities is significantly improved. Recommend continued follow-up. No developing new mass lesion, fluid collection or lymph node enlargement. Stable posttreatment changes right perihilar in the lung. Fatty liver infiltration. Colonic diverticulosis. Improved tiny right pleural effusion   02/05/2023 Imaging   MRI brain with and without contrast Interval decrease in all of the metastatic lesions, detailed above.No new lesions identified.    Imaging   CT chest abdomen pelvis w contrast showed  1. 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 2. Trace right pleural fluid or thickening, new or minimally increased. 3. Incidental findings, including: Aortic atherosclerosis (ICD10-I70.0), coronary artery atherosclerosis and emphysema (ICD10-J43.9). Hepatic steatosis.   06/25/2023 Imaging   CT chest abdomen pelvis w contrast showed 1. Unchanged post treatment/post radiation appearance of the right lung, with perihilar and suprahilar paramedian fibrosis and consolidation. 2. No evidence of lymphadenopathy or metastatic disease in the chest, abdomen, or pelvis.  3. Hepatic steatosis. 4. Coronary artery disease.   Aortic Atherosclerosis (ICD10-I70.0) and Emphysema (ICD10-J43.9).   08/26/2023 [facility] Admission   hospitalization due to was observed speech and jerking movements on right  side despite being on dexamethasone . Continuous EEG suggest cortical dysfunction arising from left temporal region.  No seizure or elective form discharges were seen. Patient was discharged on dexamethasone  4 mg, Keppra , lamotrigine .    11/04/2023 -  Chemotherapy   Add Bevacizumab  5mg /kg to Durvalumab .     11/17/2023 - 11/27/2023 Radiation Therapy   Patient received radiation to brain for treatment of recurrence.   02/23/2024 Imaging   CT chest abdomen pelvis w contrast showed  CHEST:   1. Post RIGHT upper lobe resection. 2. No evidence of lung cancer recurrence. 3. No mediastinal adenopathy.   PELVIS:   1. No evidence of metastatic disease in the abdomen pelvis. 2. No skeletal metastasis.     INTERVAL HISTORY Joshua [name] is a 68 y.o. male who has above history reviewed by me today presents for follow up visit for right lung RCC.  chronic SOB,not worse.  Peripheral artery disease,Patient is on aspirin  and Plavix . No new complaints   Review of Systems  Constitutional:  Positive for fatigue. Negative for appetite change, chills, fever and unexpected weight change.  HENT:   Negative for hearing loss and voice change.   Eyes:  Negative for eye problems and icterus.  Respiratory:  Negative for chest tightness, cough and shortness of breath.   Cardiovascular:  Positive for leg swelling. Negative for chest pain.  Gastrointestinal:  Negative for abdominal distention, abdominal pain, nausea and vomiting.  Endocrine: Negative for hot flashes.  Genitourinary:  Negative for difficulty urinating, dysuria and frequency.   Musculoskeletal:  Positive for gait problem. Negative for arthralgias.  Skin:  Negative for itching and rash.  Neurological:  Positive for gait problem. Negative for extremity weakness, headaches, numbness and speech difficulty.  Hematological:  Negative for adenopathy. Bruises/bleeds easily.  Psychiatric/Behavioral:  Negative for confusion.       MEDICAL  HISTORY:  Past Medical History:  Diagnosis Date   Allergy    Cancer (HCC)    lung   COPD (chronic obstructive pulmonary disease) (HCC)    Dyspnea    former smoker. 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   Malignant neoplasm metastatic to brain [facility])    Nausea with vomiting 02/01/2024   Peripheral vascular disease    Pre-diabetes    Sleep apnea     SURGICAL HISTORY: Past Surgical History:  Procedure Laterality Date   ANTERIOR CERVICAL DECOMP/DISCECTOMY FUSION N/A 03/14/2020   Procedure: ANTERIOR CERVICAL DECOMPRESSION/DISCECTOMY FUSION 3 LEVELS C4-7;  Surgeon: Clois Fret, MD;  Location: [facility] ORS;  Service: Neurosurgery;  Laterality: N/A;   BACK SURGERY  2011   CENTRAL LINE INSERTION Right 09/10/2016   Procedure: CENTRAL LINE INSERTION;  Surgeon: Jama Cordella MATSU, MD;  Location: [facility] ORS;  Service: Vascular;  Laterality: Right;   CERVICAL FUSION     C4-c7 on Mar 14, 2020   COLONOSCOPY  2013 ?   COLONOSCOPY N/A 09/09/2021   Procedure: COLONOSCOPY;  Surgeon: Onita Elspeth Sharper, DO;  Location: [facility] ENDOSCOPY;  Service: Gastroenterology;  Laterality: N/A;   CORONARY ANGIOPLASTY     ESOPHAGOGASTRODUODENOSCOPY N/A 09/09/2021   Procedure: ESOPHAGOGASTRODUODENOSCOPY (EGD);  Surgeon: Onita Elspeth Sharper, DO;  Location: [facility] ENDOSCOPY;  Service: Gastroenterology;  Laterality: N/A;   FEMORAL-POPLITEAL  BYPASS GRAFT Right 09/10/2016   Procedure: BYPASS GRAFT FEMORAL-POPLITEAL ARTERY ( BELOW KNEE );  Surgeon: Jama Cordella MATSU, MD;  Location: [facility] ORS;  Service: Vascular;  Laterality: Right;   FEMORAL-TIBIAL BYPASS GRAFT Right 03/23/2020   Procedure: BYPASS GRAFT RIGHT FEMORAL- DISTAL TIBIAL ARTERY WITH DISTAL FLOW GRAFT;  Surgeon: Jama Cordella MATSU, MD;  Location: [facility] ORS;  Service: Vascular;  Laterality: Right;   LEFT HEART CATH AND CORONARY ANGIOGRAPHY Left 05/16/2021   Procedure: LEFT HEART CATH AND CORONARY  ANGIOGRAPHY;  Surgeon: Lawyer Bernardino Cough, MD;  Location: [facility] INVASIVE CV LAB;  Service: Cardiovascular;  Laterality: Left;   LOWER EXTREMITY ANGIOGRAPHY Right 11/18/2016   Procedure: Lower Extremity Angiography;  Surgeon: Cordella MATSU Jama, MD;  Location: [facility] INVASIVE CV LAB;  Service: Cardiovascular;  Laterality: Right;   LOWER EXTREMITY ANGIOGRAPHY Right 12/09/2016   Procedure: Lower Extremity Angiography;  Surgeon: Jama Cordella MATSU, MD;  Location: [facility] INVASIVE CV LAB;  Service: Cardiovascular;  Laterality: Right;   LOWER EXTREMITY ANGIOGRAPHY Right 11/15/2019   Procedure: LOWER EXTREMITY ANGIOGRAPHY;  Surgeon: Jama Cordella MATSU, MD;  Location: [facility] INVASIVE CV LAB;  Service: Cardiovascular;  Laterality: Right;   LOWER EXTREMITY ANGIOGRAPHY Right 12/01/2019   Procedure: Lower Extremity Angiography;  Surgeon: Marea Selinda RAMAN, MD;  Location: [facility] INVASIVE CV LAB;  Service: Cardiovascular;  Laterality: Right;   LOWER EXTREMITY ANGIOGRAPHY Right 12/02/2019   Procedure: LOWER EXTREMITY ANGIOGRAPHY;  Surgeon: Jama Cordella MATSU, MD;  Location: [facility] INVASIVE CV LAB;  Service: Cardiovascular;  Laterality: Right;   LOWER EXTREMITY ANGIOGRAPHY Right 03/23/2020   Procedure: Lower Extremity Angiography;  Surgeon: Jama Cordella MATSU, MD;  Location: [facility] INVASIVE CV LAB;  Service: Cardiovascular;  Laterality: Right;   LOWER EXTREMITY ANGIOGRAPHY Right 09/25/2021   Procedure: Lower Extremity Angiography;  Surgeon: Jama Cordella MATSU, MD;  Location: [facility] INVASIVE CV LAB;  Service: Cardiovascular;  Laterality: Right;   LOWER EXTREMITY ANGIOGRAPHY Right 09/26/2021   Procedure: Lower Extremity Angiography;  Surgeon: Marea Selinda RAMAN, MD;  Location: [facility] INVASIVE CV LAB;  Service: Cardiovascular;  Laterality: Right;   LOWER EXTREMITY ANGIOGRAPHY Right 04/08/2022   Procedure: Lower Extremity Angiography;  Surgeon: Jama Cordella MATSU, MD;  Location: [facility] INVASIVE CV LAB;  Service: Cardiovascular;  Laterality: Right;   LOWER  EXTREMITY ANGIOGRAPHY Right 03/04/2024   Procedure: Lower Extremity Angiography;  Surgeon: Jama Cordella MATSU, MD;  Location: [facility] INVASIVE CV LAB;  Service: Cardiovascular;  Laterality: Right;   LOWER EXTREMITY ANGIOGRAPHY Right 03/25/2024   Procedure: Lower Extremity Angiography;  Surgeon: Jama Cordella MATSU, MD;  Location: [facility] INV[facility]IVE CV LAB;  Service: Cardiovascular;  Laterality: Right;   LOWER EXTREMITY INTERVENTION  11/18/2016   Procedure: Lower Extremity Intervention;  Surgeon: Cordella MATSU Jama, MD;  Location: [facility] INVASIVE CV LAB;  Service: Cardiovascular;;   LOWER EXTREMITY INTERVENTION Right 03/04/2024   Procedure: LOWER EXTREMITY INTERVENTION;  Surgeon: Jama Cordella MATSU, MD;  Location: [facility] INVASIVE CV LAB;  Service: Cardiovascular;  Laterality: Right;   PERIPHERAL VASCULAR CATHETERIZATION Right 01/02/2015   Procedure: Lower Extremity Angiography;  Surgeon: Cordella MATSU Jama, MD;  Location: [facility] INVAS[facility] LAB;  Service: Cardiovascular;  Laterality: Right;   PERIPHERAL VASCULAR CATHETERIZATION Right 06/18/2015   Procedure: Lower Extremity Angiography;  Surgeon: Selinda RAMAN Marea, MD;  Location: [facility] INV[facility] LAB;  Service: Cardiovascular;  Laterality: Right;   PERIPHERAL VASCULAR CATHETERIZATION  06/18/2015   Procedure: Lower Extremity Intervention;  Surgeon: Selinda RAMAN Marea, MD;  Location: [facility] INVAS[facility] CV LAB;  Service: Cardiovascular;;   PERIPHERAL  VASCULAR CATHETERIZATION N/A 10/09/2015   Procedure: Abdominal Aortogram w/Lower Extremity;  Surgeon: Cordella KANDICE Shawl, MD;  Location: [facility] INV[facility] LAB;  Service: Cardiovascular;  Laterality: N/A;   PERIPHERAL VASCULAR CATHETERIZATION  10/09/2015   Procedure: Lower Extremity Intervention;  Surgeon: Cordella KANDICE Shawl, MD;  Location: [facility];  Service: Cardiovascular;;   PERIPHERAL VASCULAR CATHETERIZATION Right 10/10/2015   Procedure: Lower Extremity Angiography;  Surgeon: Selinda GORMAN Gu, MD;  Location: [facility] LAB;  Service:  Cardiovascular;  Laterality: Right;   PERIPHERAL VASCULAR CATHETERIZATION Right 10/10/2015   Procedure: Lower Extremity Intervention;  Surgeon: Selinda GORMAN Gu, MD;  Location: [facility];  Service: Cardiovascular;  Laterality: Right;   PERIPHERAL VASCULAR CATHETERIZATION Right 12/03/2015   Procedure: Lower Extremity Angiography;  Surgeon: Selinda GORMAN Gu, MD;  Location: [facility] IN[facility] LAB;  Service: Cardiovascular;  Laterality: Right;   PERIPHERAL VASCULAR CATHETERIZATION Right 12/04/2015   Procedure: Lower Extremity Angiography;  Surgeon: Selinda GORMAN Gu, MD;  Location: [facility] IN[facility] LAB;  Service: Cardiovascular;  Laterality: Right;   PERIPHERAL VASCULAR CATHETERIZATION  12/04/2015   Procedure: Lower Extremity Intervention;  Surgeon: Selinda GORMAN Gu, MD;  Location: [facility] LAB;  Service: Cardiovascular;;   PERIPHERAL VASCULAR CATHETERIZATION Right 06/30/2016   Procedure: Lower Extremity Angiography;  Surgeon: Selinda GORMAN Gu, MD;  Location: [facility] INV[facility] LAB;  Service: Cardiovascular;  Laterality: Right;   PERIPHERAL VASCULAR CATHETERIZATION Right 06/25/2016   Procedure: Lower Extremity Angiography;  Surgeon: Cordella KANDICE Shawl, MD;  Location: [facility] LAB;  Service: Cardiovascular;  Laterality: Right;   PERIPHERAL VASCULAR CATHETERIZATION Right 07/01/2016   Procedure: Lower Extremity Angiography;  Surgeon: Cordella KANDICE Shawl, MD;  Location: [facility];  Service: Cardiovascular;  Laterality: Right;   PERIPHERAL VASCULAR CATHETERIZATION  07/01/2016   Procedure: Lower Extremity Intervention;  Surgeon: Cordella KANDICE Shawl, MD;  Location: [facility];  Service: Cardiovascular;;   PERIPHERAL VASCULAR CATHETERIZATION Right 08/01/2016   Procedure: Lower Extremity Angiography;  Surgeon: Cordella KANDICE Shawl, MD;  Location: [facility] IN[facility] LAB;  Service: Cardiovascular;  Laterality: Right;   PORTA CATH INSERTION N/A 10/05/2020   Procedure: PORTA CATH INSERTION;  Surgeon: Shawl Cordella KANDICE, MD;  Location: [facility] INV[facility]IVE CV LAB;  Service: Cardiovascular;  Laterality: N/A;   stent placement in right leg Right    VIDEO BRONCHOSCOPY N/A 09/14/2020   Procedure: VIDEO BRONCHOSCOPY WITH FLUORO;  Surgeon: Tamea Dedra CROME, MD;  Location: [facility] ORS;  Service: Cardiopulmonary;  Laterality: N/A;   VIDEO BRONCHOSCOPY WITH ENDOBRONCHIAL ULTRASOUND N/A 09/14/2020   Procedure: VIDEO BRONCHOSCOPY WITH ENDOBRONCHIAL ULTRASOUND;  Surgeon: Tamea Dedra CROME, MD;  Location: [facility] ORS;  Service: Cardiopulmonary;  Laterality: N/A;    SOCIAL HISTORY: Social History   Socioeconomic History   Marital status: Single    Spouse name: Not on file   Number of children: Not on file   Years of education: Not on file   Highest education level: Not on file  Occupational History   Occupation: unemployed  Tobacco Use   Smoking status: Former    Current packs/day: 0.00    Average packs/day: 2.0 packs/day for 38.0 years (76.0 ttl pk-yrs)    Types: Cigarettes    Start date: 12/19/1976    Quit date: 12/20/2014    Years since quitting: 9.4   Smokeless tobacco: Former   Tobacco comments:    quit smoking in 2017 most smoked 2   Vaping Use   Vaping status: Never Used  Substance and Sexual Activity   Alcohol  use: Not Currently   Drug use: No   Sexual activity: Yes  Other Topics Concern   Not on file  Social History Narrative   Not on file   Social Drivers of Health   Financial Resource Strain: Medium Risk (02/05/2024)   Overall Financial Resource Strain (CARDIA)    Difficulty of Paying Living Expenses: Somewhat hard  Food Insecurity: No Food Insecurity (03/25/2024)   Hunger Vital Sign    Worried About Running Out of Food in the Last Year: Never true    Ran Out of Food in the Last Year: Never true  Transportation Needs: No Transportation Needs (03/25/2024)   PRAPARE - Administrator, Civil Service (Medical): No    Lack of Transportation (Non-Medical): No  Physical Activity: Inactive  (02/05/2024)   Exercise Vital Sign    Days of Exercise per Week: 1 day    Minutes of Exercise per Session: 0 min  Stress: No Stress Concern Present (02/05/2024)   [employer] of Occupational Health - Occupational Stress Questionnaire    Feeling of Stress: Only a little  Social Connections: Socially Isolated (03/25/2024)   Social Connection and Isolation Panel    Frequency of Communication with Friends and Family: Three times a week    Frequency of Social Gatherings with Friends and Family: Once a week    Attends Religious Services: Never    Database Administrator or Organizations: No    Attends Banker Meetings: Never    Marital Status: Never married  Intimate Partner Violence: Not At Risk (03/25/2024)   Humiliation, Afraid, Rape, and Kick questionnaire    Fear of Current or Ex-Partner: No    Emotionally Abused: No    Physically Abused: No    Sexually Abused: No    FAMILY HISTORY: Family History  Problem Relation Age of Onset   Diabetes Mother    Hypertension Mother    Heart murmur Mother    Leukemia Mother    Throat cancer Maternal Grandmother    Colon cancer Neg Hx    Breast cancer Neg Hx     ALLERGIES:  has no known allergies.  MEDICATIONS:  Current Outpatient Medications  Medication Sig Dispense Refill   aspirin  EC 81 MG tablet Take 81 mg by mouth daily. Swallow whole.     clopidogrel  (PLAVIX ) 75 MG tablet TAKE 1 TABLET BY MOUTH DAILY 100 tablet 2   levETIRAcetam  (KEPPRA ) 1000 MG tablet Take 1 tablet (1,000 mg total) by mouth 2 (two) times daily. 60 tablet 2   levothyroxine  (SYNTHROID ) 125 MCG tablet Take 1 tablet (125 mcg total) by mouth daily before breakfast. 90 tablet 0   ondansetron  (ZOFRAN ) 8 MG tablet Take 1 tablet (8 mg total) by mouth every 8 (eight) hours as needed for nausea or vomiting. 60 tablet 1   potassium chloride  SA (KLOR-CON  M) 20 MEQ tablet Take 1 tablet (20 mEq total) by mouth daily. 30 tablet 2   rosuvastatin  (CRESTOR ) 40 MG tablet  Take 1 tablet (40 mg total) by mouth daily. 90 tablet 3   No current facility-administered medications for this visit.   Facility-Administered Medications Ordered in Other Visits  Medication Dose Route Frequency Provider Last Rate Last Admin   albuterol  (PROVENTIL ) (2.5 MG/3ML) 0.083% nebulizer solution 2.5 mg  2.5 mg Nebulization Once Tamea Dedra CROME, MD       heparin  lock flush 100 unit/mL  500 Units Intracatheter Once PRN [name], MD  PHYSICAL EXAMINATION: ECOG PERFORMANCE STATUS: 1 - Symptomatic but completely ambulatory Vitals:   05/23/24 0855  BP: 115/81  Pulse: 99  Resp: 18  Temp: 98.3 F (36.8 C)  SpO2: 100%    Filed Weights   05/23/24 0855  Weight: 186 lb 1.6 oz (84.4 kg)     Physical Exam Constitutional:      General: He is not in acute distress.    Appearance: He is obese.  HENT:     Head: Normocephalic and atraumatic.  Eyes:     General: No scleral icterus. Cardiovascular:     Rate and Rhythm: Normal rate and regular rhythm.  Pulmonary:     Effort: Pulmonary effort is normal. No respiratory distress.     Breath sounds: Normal breath sounds. No wheezing.  Abdominal:     General: There is no distension.     Palpations: Abdomen is soft.     Tenderness: There is no abdominal tenderness.  Musculoskeletal:     Cervical back: Normal range of motion and neck supple.     Comments: Right lower extremity wrapped.   Skin:    General: Skin is warm and dry.     Findings: Bruising present. No erythema.  Neurological:     Mental Status: He is alert and oriented to person, place, and time. Mental status is at baseline.     Cranial Nerves: No cranial nerve deficit.     Motor: No abnormal muscle tone.  Psychiatric:        Mood and Affect: Mood and affect normal.         LABORATORY DATA:  I have reviewed the data as listed    Latest Ref Rng & Units 05/23/2024    8:30 AM 05/09/2024    8:55 AM 04/25/2024    8:30 AM  CBC  WBC 4.0 - 10.5 K/uL 5.7   6.3  4.5   Hemoglobin 13.0 - 17.0 g/dL 88.7  88.9  88.7   Hematocrit 39.0 - 52.0 % 35.9  34.8  35.3   Platelets 150 - 400 K/uL 248  244  281       Latest Ref Rng & Units 05/23/2024    8:30 AM 05/09/2024    8:55 AM 04/25/2024    8:30 AM  CMP  Glucose 70 - 99 mg/dL 874  874  881   BUN 8 - 23 mg/dL 15  12  7    Creatinine 0.61 - 1.24 mg/dL 9.08  9.18  9.12   Sodium 135 - 145 mmol/L 139  139  140   Potassium 3.5 - 5.1 mmol/L 3.6  3.6  3.8   Chloride 98 - 111 mmol/L 106  107  107   CO2 22 - 32 mmol/L 22  23  24    Calcium  8.9 - 10.3 mg/dL 9.0  9.1  9.3   Total Protein 6.5 - 8.1 g/dL 7.2  6.6  6.7   Total Bilirubin 0.0 - 1.2 mg/dL 0.8  0.5  0.6   Alkaline Phos 38 - 126 U/L 61  60  69   AST 15 - 41 U/L 24  29  30    ALT 0 - 44 U/L 14  16  13       RADIOGRAPHIC STUDIES: I have personally reviewed the radiological images as listed and agreed with the findings in the report. VAS US  LOWER EXTREMITY ARTERIAL DUPLEX Result Date: 04/22/2024 LOWER EXTREMITY ARTERIAL DUPLEX STUDY Patient Name:  EVALENE DELENA BROWNER  Date of Exam:  04/21/2024 Medical Rec #: [id number]            Accession #:    [id number] Date of Birth: 1955-11-22           Patient Gender: M Patient Age:   62 years Exam Location:  [facility] Procedure:      VAS US  LOWER EXTREMITY ARTERIAL DUPLEX Referring Phys: [name] --------------------------------------------------------------------------------  Indications: Ulceration, peripheral artery disease, and Routine f/u. High Risk Factors: Hyperlipidemia. Other Factors: ASO s/p multiple interventions and bypass.                11/15/2019 Right thrombectomy of Fempop insitu graft with added                stent throughout.                 12/01/2019: Aortogram and Selctive Right Lower Extremity                Angiogram. 6 mg of TPA delivered in a lysis catheterfrom the                Origin of the Right Femoral -Popliteal bypass down the proximal                ATA with  placement of the lysis.                 12/02/2019: PTA of the Right Posterior Tibial Artery. PTA of the                Right Peroneal Artery. Mechanical Thrombectomy of the Right                Femoral -Popliteal Bypass graft. Mechanical Thrombectomy of the                Right Tibioperoneal Trunk and Posterior Tibial Artery.                 09/25/2021: Right Lower Extremity Angiography third order                catheter placement. PTA of the proximal peroneal and                tibioperoneal trunk to 2.5 mm with an ultra score balloon. PTA of                the Femoral Artery and the proximal anstomosis of the bypass                graft to 4 mm with an ultra score balloon. Administration of 5 mg                of tpa throughout the entire bypass and in the peroneal Artery.                Initiation of continuous tpa infusion Right lower Extremity for                limb salvage. Insertion of a triple lumen catheter Left CFV with                US  and fluoroscopic guidance.                 09/26/2021: Right LE Angiogram. PTA of the Right Posterior Tibial                Artery with 2.5 mm diameter by 30 cm length angioplasty balloon.  PTA of the distal bypass anastomosis and distal bypass graft                analogous to the Popliteal artery with 4 mm diameter by 8 cm                length Lutonix drug coated angioplasty balloon.                04/08/2022 right thrombectomy of the Deep profunda.  Vascular Interventions: 03/25/2024 Right lower extremity angiography                         3. Atherectomy right common femoral artery,                         femoral-popliteal bypass and distal popliteal arteries                         with the Rota Rex catheter                         4. Ultrasound-guided access to the left common femoral                         artery                         5. Mechanical thrombectomy using the penumbra lightening                         bolt 7 catheter                          6. Percutaneous kissing stents in the profunda femoris                         and origin of the femoral-popliteal bypass with a 9 x 40                         life star stent in the profunda and a 7 x 100 life stent                         in the femoral-popliteal bypass                         7. Percutaneous angioplasty and stent placement distal                         bypass and popliteal artery with a 6 mm x 40 mm life                         stent.                         8. Percutaneous transluminal angioplasty and stent                         placement right tibioperoneal trunk and posterior tibial  artery using 3 Esprit stents postdilated to 4 mm                         proximally and 3 mm distally.                          Hx of multiple right lower extremity PTA's/stents.                         09/10/2016 Right femoral to below the knee popliteal                         artery bypass graft placement. 5/01 Right bypass graft &                         mid popliteal artery PTA & coil embolectomy of large                         branch of bypass graft. 12/09/16 Right distal bypass                         graft PTA/stent and right posterior tibial artery PTA.                         03/23/2020 Rt PTA thrombectomy. Rt Profunda fem artery to                         PTA bypass. Current ABI:            rt = 1.13 Performing Technologist: Jerel Croak RVT  Examination Guidelines: A complete evaluation includes B-mode imaging, spectral Doppler, color Doppler, and power Doppler as needed of all accessible portions of each vessel. Bilateral testing is considered an integral part of a complete examination. Limited examinations for reoccurring indications may be performed as noted.  +-----------+--------+-----+--------+----------+--------+ RIGHT      PSV cm/sRatioStenosisWaveform  Comments +-----------+--------+-----+--------+----------+--------+ CFA Mid    54                    biphasic           +-----------+--------+-----+--------+----------+--------+ DFA        189                  triphasic          +-----------+--------+-----+--------+----------+--------+ SFA Prox                                  BPG      +-----------+--------+-----+--------+----------+--------+ SFA Mid                                   BPG      +-----------+--------+-----+--------+----------+--------+ SFA Distal                                BPG      +-----------+--------+-----+--------+----------+--------+ POP Distal 38                   biphasic           +-----------+--------+-----+--------+----------+--------+  ATA Distal 19                   monophasic         +-----------+--------+-----+--------+----------+--------+ PTA Distal 48                   triphasic          +-----------+--------+-----+--------+----------+--------+ PERO Distal19                   monophasic         +-----------+--------+-----+--------+----------+--------+  Right Graft #1: FemPop +------------------+--------+--------+---------+--------+                   PSV cm/sStenosisWaveform Comments +------------------+--------+--------+---------+--------+ Inflow            54              biphasic          +------------------+--------+--------+---------+--------+ Prox Anastomosis  52              biphasic          +------------------+--------+--------+---------+--------+ Proximal Graft    64              biphasic          +------------------+--------+--------+---------+--------+ Mid Graft         63              triphasic         +------------------+--------+--------+---------+--------+ Distal Graft      74              biphasic          +------------------+--------+--------+---------+--------+ Distal Anastomosis58              triphasic         +------------------+--------+--------+---------+--------+ Outflow           38              biphasic           +------------------+--------+--------+---------+--------+   Summary: Right: Patent FemPop BPG and new stents throughout. Strong triphasic flow to distal PTA. NL abi  See table(s) above for measurements and observations. Electronically signed by [name] MD on 04/22/2024 at 9:35:02 AM.    Final    VAS US  ABI WITH/WO TBI Result Date: 04/22/2024  LOWER EXTREMITY DOPPLER STUDY Patient Name:  TAISHAWN SMALDONE  Date of Exam:   04/21/2024 Medical Rec #: [id number]            Accession #:    [id number] Date of Birth: February 23, 1956           Patient Gender: M Patient Age:   71 years Exam Location:  [facility] Procedure:      VAS US  ABI WITH/WO TBI Referring Phys: [facility] --------------------------------------------------------------------------------  Indications: Ulceration, peripheral artery disease, and Routine f/u. High Risk Factors: Hyperlipidemia. Other Factors: ASO s/p multiple interventions and bypass.                11/15/2019 Right thrombectomy of Fempop insitu graft with added                stent throughout.                 12/01/2019: Aortogram and Selctive Right Lower Extremity                Angiogram. 6 mg of TPA delivered in a lysis catheterfrom the  Origin of the Right Femoral -Popliteal bypass down the proximal                ATA with placement of the lysis.                 12/02/2019: PTA of the Right Posterior Tibial Artery. PTA of the                Right Peroneal Artery. Mechanical Thrombectomy of the Right                Femoral -Popliteal Bypass graft. Mechanical Thrombectomy of the                Right Tibioperoneal Trunk and Posterior Tibial Artery.                 09/25/2021: Right Lower Extremity Angiography third order                catheter placement. PTA of the proximal peroneal and                tibioperoneal trunk to 2.5 mm with an ultra score balloon. PTA of                the Femoral Artery and the proximal anstomosis of the bypass                 graft to 4 mm with an ultra score balloon. Administration of 5 mg                of tpa throughout the entire bypass and in the peroneal Artery.                Initiation of continuous tpa infusion Right lower Extremity for                limb salvage. Insertion of a triple lumen catheter Left CFV with                US  and fluoroscopic guidance.                 09/26/2021: Right LE Angiogram. PTA of the Right Posterior Tibial                Artery with 2.5 mm diameter by 30 cm length angioplasty balloon.                PTA of the distal bypass anastomosis and distal bypass graft                analogous to the Popliteal artery with 4 mm diameter by 8 cm                length Lutonix drug coated angioplasty balloon.                04/08/2022 right thrombectomy of the Deep profunda.  Vascular Interventions: 03/25/2024 Right lower extremity angiography                         3. Atherectomy right common femoral artery,                         femoral-popliteal bypass and distal popliteal arteries                         with the Rota Rex catheter  4. Ultrasound-guided access to the left common femoral                         artery                         5. Mechanical thrombectomy using the penumbra lightening                         bolt 7 catheter                         6. Percutaneous kissing stents in the profunda femoris                         and origin of the femoral-popliteal bypass with a 9 x 40                         life star stent in the profunda and a 7 x 100 life stent                         in the femoral-popliteal bypass                         7. Percutaneous angioplasty and stent placement distal                         bypass and popliteal artery with a 6 mm x 40 mm life                         stent.                         8. Percutaneous transluminal angioplasty and stent                         placement right tibioperoneal trunk and posterior tibial                          artery using 3 Esprit stents postdilated to 4 mm                         proximally and 3 mm distally.                          Hx of multiple right lower extremity PTA's/stents.                         09/10/2016 Right femoral to below the knee popliteal                         artery bypass graft placement. 5/01 Right bypass graft &                         mid popliteal artery PTA & coil embolectomy of large                         branch of bypass graft. 12/09/16 Right distal  bypass                         graft PTA/stent and right posterior tibial artery PTA.                         03/23/2020 Rt PTA thrombectomy. Rt Profunda fem artery to                         PTA bypass. Performing Technologist: Jerel Croak RVT  Examination Guidelines: A complete evaluation includes at minimum, Doppler waveform signals and systolic blood pressure reading at the level of bilateral brachial, anterior tibial, and posterior tibial arteries, when vessel segments are accessible. Bilateral testing is considered an integral part of a complete examination. Photoelectric Plethysmograph (PPG) waveforms and toe systolic pressure readings are included as required and additional duplex testing as needed. Limited examinations for reoccurring indications may be performed as noted.  ABI Findings: +---------+------------------+-----+--------+--------+ Right    Rt Pressure (mmHg)IndexWaveformComment  +---------+------------------+-----+--------+--------+ Brachial 128                                     +---------+------------------+-----+--------+--------+ PTA      140               1.08 biphasic         +---------+------------------+-----+--------+--------+ DP       147               1.13 biphasic         +---------+------------------+-----+--------+--------+ Great Toe145               1.12 Normal           +---------+------------------+-----+--------+--------+  +---------+------------------+-----+---------+-------+ Left     Lt Pressure (mmHg)IndexWaveform Comment +---------+------------------+-----+---------+-------+ Brachial 130                                     +---------+------------------+-----+---------+-------+ PTA      145               1.12 triphasic        +---------+------------------+-----+---------+-------+ DP       144               1.11 triphasic        +---------+------------------+-----+---------+-------+ Great Toe152               1.17 Normal           +---------+------------------+-----+---------+-------+ +-------+-----------+-----------+------------+------------+ ABI/TBIToday's ABIToday's TBIPrevious ABIPrevious TBI +-------+-----------+-----------+------------+------------+ Right  1.13       1.12       .37         .26          +-------+-----------+-----------+------------+------------+ Left   1.12       1.17       1.13        .75          +-------+-----------+-----------+------------+------------+  Right TBIs and ABIs appear increased compared to prior study on 02/2024.  Summary: Right: Resting right ankle-brachial index is within normal range. The right toe-brachial index is normal.  Left: Resting left ankle-brachial index is within normal range. The left toe-brachial index is normal.  *See table(s) above for measurements and observations.  Electronically signed by [name] MD  on 04/22/2024 at 9:34:54 AM.    Final    MR BRAIN W WO CONTRAST Result Date: 04/11/2024 CLINICAL DATA:  Brain/CNS neoplasm, assess treatment response EXAM: MRI HEAD WITHOUT AND WITH CONTRAST TECHNIQUE: Multiplanar, multiecho pulse sequences of the brain and surrounding structures were obtained without and with intravenous contrast. CONTRAST:  9 mL of Vueway  COMPARISON:  MRI head January 04, 2024. FINDINGS: Brain: Slight increase in size of a centrally necrotic 9 mm lesion in the right occipital lobe. Similar versus slightly  decreased necrotic left parietal lesion which measures approximately 2.1 x 1.9 cm. Similar versus slightly improved/decreased 1.1 x 1.1 cm lesion in the left frontal operculum. Unchanged punctate focus of enhancement in the left cerebellum (series 14, image 42). Edema surrounding these lesions is not substantially changed. No progressive mass effect. No midline shift. No evidence of acute infarct, acute hemorrhage, or hydrocephalus. Vascular: Normal flow voids. Skull and upper cervical spine: Normal marrow signal. Sinuses/Orbits: Clear sinuses.  No acute findings. IMPRESSION: 1. 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 This could represent treatment related change or progression. Recommend attention on follow-up. 2. 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 3. Unchanged punctate lesion in the left cerebellum, most likely a vessel but warranting attention on follow-up. 4. 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 Recommend ENT follow-up if not previously evaluated. Electronically Signed   By: Gilmore GORMAN Molt M.D.   On: 04/11/2024 13:00   PERIPHERAL VASCULAR CATHETERIZATION Result Date: 03/25/2024 See surgical note for result.  PERIPHERAL VASCULAR CATHETERIZATION Result Date: 03/04/2024 See surgical note for result.  VAS US  ABI WITH/WO TBI Result Date: 02/29/2024  LOWER EXTREMITY DOPPLER STUDY Patient Name:  COLLIER BOHNET  Date of Exam:   02/25/2024 Medical Rec #: [id number]            Accession #:    [id number] Date of Birth: March 03, 1956           Patient Gender: M Patient Age:   15 years Exam Location:  [facility] Procedure:      VAS US  ABI WITH/WO TBI Referring Phys: --------------------------------------------------------------------------------  Indications: Ulceration, peripheral artery disease, and Routine f/u. Patient               reports no claudication or lower extremity pain. High Risk Factors: Hyperlipidemia, past history of smoking. Other Factors: ASO s/p multiple interventions and bypass.                11/15/2019 Right thrombectomy of Fempop insitu graft with added                stent throughout.                 12/01/2019: Aortogram and Selective Right Lower Extremity                Angiogram. 6 mg of TPA delivered in a lysis catheter the Origin                of the Right Femoral -Popliteal bypass down the proximal ATA with                placement of the lysis.                 12/02/2019: PTA of the Right Posterior Tibial Artery. PTA of the  Right Peroneal Artery. Mechanical Thrombectomy of the Right                Femoral -Popliteal Bypass graft. Mechanical Thrombectomy of the                Right Tibioperoneal Trunk and Posterior Tibial Artery.                 09/25/2021: Right Lower Extremity Angiography third order                catheter placement. PTA of the proximal peroneal and                tibioperoneal trunk to 2.5 mm with an ultra score balloon. PTA of                the Femoral Artery and the proximal anastomosis of the bypass                graft to 4 mm with an ultra score balloon. Administration of 5 mg                of tpa throughout the entire bypass and in the peroneal Artery.                Initiation of continuous tpa infusion Right lower Extremity for                limb salvage. Insertion of a triple lumen catheter Left CFV with                US  and fluoroscopic guidance.                 09/26/2021: Right LE Angiogram. PTA of the Right Posterior Tibial                Artery with 2.5 mm diameter by 30 cm length angioplasty balloon.                PTA of the distal bypass anastomosis and distal bypass graft                analogous to the Popliteal artery with 4 mm diameter by 8 cm                length Lutonix drug coated angioplasty balloon.                04/08/2022 right thrombectomy of the  Deep profunda.  Vascular Interventions: Hx of multiple right lower extremity PTA's/stents.                         09/10/2016 Right femoral to below the knee popliteal                         artery bypass graft placement. 5/01 Right bypass graft &                         mid popliteal artery PTA & coil embolectomy of large                         branch of bypass graft. 12/09/16 Right distal bypass                         graft PTA/stent and right posterior tibial artery PTA.  03/23/2020 Rt PTA thrombectomy. Rt Profunda fem artery to                         PTA bypass. Performing Technologist: Donnice Charnley RVT  Examination Guidelines: A complete evaluation includes at minimum, Doppler waveform signals and systolic blood pressure reading at the level of bilateral brachial, anterior tibial, and posterior tibial arteries, when vessel segments are accessible. Bilateral testing is considered an integral part of a complete examination. Photoelectric Plethysmograph (PPG) waveforms and toe systolic pressure readings are included as required and additional duplex testing as needed. Limited examinations for reoccurring indications may be performed as noted.  ABI Findings: +---------+------------------+-----+----------+--------+ Right    Rt Pressure (mmHg)IndexWaveform  Comment  +---------+------------------+-----+----------+--------+ Brachial 167                                       +---------+------------------+-----+----------+--------+ PTA      61                0.37 monophasic         +---------+------------------+-----+----------+--------+ DP       54                0.32 monophasic         +---------+------------------+-----+----------+--------+ Great Toe43                0.26                    +---------+------------------+-----+----------+--------+ +---------+------------------+-----+---------+-------+ Left     Lt Pressure (mmHg)IndexWaveform Comment  +---------+------------------+-----+---------+-------+ Brachial 164                                     +---------+------------------+-----+---------+-------+ PTA      165               0.99 biphasic         +---------+------------------+-----+---------+-------+ DP       189               1.13 triphasic        +---------+------------------+-----+---------+-------+ Great Toe125               0.75                  +---------+------------------+-----+---------+-------+ +-------+-----------+-----------+------------+------------+ ABI/TBIToday's ABIToday's TBIPrevious ABIPrevious TBI +-------+-----------+-----------+------------+------------+ Right  0.37       0.26       0.44        0.24         +-------+-----------+-----------+------------+------------+ Left   1.13       0.75       1.13        0.61         +-------+-----------+-----------+------------+------------+ Bilateral ABIs appear essentially unchanged compared to prior study on 07/09/2023.  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 The right toe-brachial index is abnormal. Left: Resting left ankle-brachial index is within normal range. The left toe-brachial index is normal. *See table(s) above for measurements and observations.  Electronically signed by [name] MD on 02/29/2024 at 7:39:27 AM.    Final

## 2024-05-23 NOTE — Assessment & Plan Note (Signed)
 continue levothyroxine 125 mcg daily

## 2024-05-23 NOTE — Patient Instructions (Signed)
 [facility] - A DEPT OF [facility]  Discharge Instructions: Thank you for choosing [facility] to provide your oncology and hematology care.  If you have a lab appointment with the Cancer Center, please go directly to the Cancer Center and check in at the registration area.  Wear comfortable clothing and clothing appropriate for easy access to any Portacath or PICC line.   We strive to give you quality time with your provider. You may need to reschedule your appointment if you arrive late (15 or more minutes).  Arriving late affects you and other patients whose appointments are after yours.  Also, if you miss three or more appointments without notifying the office, you may be dismissed from the clinic at the provider's discretion.      For prescription refill requests, have your pharmacy contact our office and allow 72 hours for refills to be completed.    Today you received the following chemotherapy and/or immunotherapy agents IMFINZI      To help prevent nausea and vomiting after your treatment, we encourage you to take your nausea medication as directed.  BELOW ARE SYMPTOMS THAT SHOULD BE REPORTED IMMEDIATELY: *FEVER GREATER THAN 100.4 F (38 C) OR HIGHER *CHILLS OR SWEATING *NAUSEA AND VOMITING THAT IS NOT CONTROLLED WITH YOUR NAUSEA MEDICATION *UNUSUAL SHORTNESS OF BREATH *UNUSUAL BRUISING OR BLEEDING *URINARY PROBLEMS (pain or burning when urinating, or frequent urination) *BOWEL PROBLEMS (unusual diarrhea, constipation, pain near the anus) TENDERNESS IN MOUTH AND THROAT WITH OR WITHOUT PRESENCE OF ULCERS (sore throat, sores in mouth, or a toothache) UNUSUAL RASH, SWELLING OR PAIN  UNUSUAL VAGINAL DISCHARGE OR ITCHING   Items with * indicate a potential emergency and should be followed up as soon as possible or go to the Emergency Department if any problems should occur.  Please show the CHEMOTHERAPY ALERT CARD or IMMUNOTHERAPY  ALERT CARD at check-in to the Emergency Department and triage nurse.  Should you have questions after your visit or need to cancel or reschedule your appointment, please contact [facility] - A DEPT OF [facility]  [phone] and follow the prompts.  Office hours are 8:00 a.m. to 4:30 p.m. Monday - Friday. Please note that voicemails left after 4:00 p.m. may not be returned until the following business day.  We are closed weekends and major holidays. You have access to a nurse at all times for urgent questions. Please call the main number to the clinic [phone] and follow the prompts.  For any non-urgent questions, you may also contact your provider using MyChart. We now offer e-Visits for anyone 30 and older to request care online for non-urgent symptoms. For details visit [email or web address].   Also download the MyChart app! Go to the app store, search "MyChart", open the app, select [facility], and log in with your MyChart username and password.  Durvalumab Injection What is this medication? DURVALUMAB (dur VAL ue mab) treats some types of cancer. It works by helping your immune system slow or stop the spread of cancer cells. It is a monoclonal antibody. This medicine may be used for other purposes; ask your health care provider or pharmacist if you have questions. COMMON BRAND NAME(S): IMFINZI What should I tell my care team before I take this medication? They need to know if you have any of these conditions: Allogeneic stem cell transplant (uses someone else's stem cells) Autoimmune diseases, such as Crohn disease, ulcerative  colitis, lupus History of chest radiation Nervous system problems, such as Guillain-Barre syndrome, myasthenia gravis Organ transplant An unusual or allergic reaction to durvalumab, other medications, foods, dyes, or preservatives Pregnant or trying to get pregnant Breast-feeding How should I use this medication? This  medication is infused into a vein. It is given by your care team in a hospital or clinic setting. A special MedGuide will be given to you before each treatment. Be sure to read this information carefully each time. Talk to your care team about the use of this medication in children. Special care may be needed. Overdosage: If you think you have taken too much of this medicine contact a poison control center or emergency room at once. NOTE: This medicine is only for you. Do not share this medicine with others. What if I miss a dose? Keep appointments for follow-up doses. It is important not to miss your dose. Call your care team if you are unable to keep an appointment. What may interact with this medication? Interactions have not been studied. This list may not describe all possible interactions. Give your health care provider a list of all the medicines, herbs, non-prescription drugs, or dietary supplements you use. Also tell them if you smoke, drink alcohol, or use illegal drugs. Some items may interact with your medicine. What should I watch for while using this medication? Your condition will be monitored carefully while you are receiving this medication. You may need blood work while taking this medication. This medication may cause serious skin reactions. They can happen weeks to months after starting the medication. Contact your care team right away if you notice fevers or flu-like symptoms with a rash. The rash may be red or purple and then turn into blisters or peeling of the skin. You may also notice a red rash with swelling of the face, lips, or lymph nodes in your neck or under your arms. Tell your care team right away if you have any change in your eyesight. Talk to your care team if you may be pregnant. Serious birth defects can occur if you take this medication during pregnancy and for 3 months after the last dose. You will need a negative pregnancy test before starting this medication.  Contraception is recommended while taking this medication and for 3 months after the last dose. Your care team can help you find the option that works for you. Do not breastfeed while taking this medication and for 3 months after the last dose. What side effects may I notice from receiving this medication? Side effects that you should report to your care team as soon as possible: Allergic reactions--skin rash, itching, hives, swelling of the face, lips, tongue, or throat Dry cough, shortness of breath or trouble breathing Eye pain, redness, irritation, or discharge with blurry or decreased vision Heart muscle inflammation--unusual weakness or fatigue, shortness of breath, chest pain, fast or irregular heartbeat, dizziness, swelling of the ankles, feet, or hands Hormone gland problems--headache, sensitivity to light, unusual weakness or fatigue, dizziness, fast or irregular heartbeat, increased sensitivity to cold or heat, excessive sweating, constipation, hair loss, increased thirst or amount of urine, tremors or shaking, irritability Infusion reactions--chest pain, shortness of breath or trouble breathing, feeling faint or lightheaded Kidney injury (glomerulonephritis)--decrease in the amount of urine, red or dark brown urine, foamy or bubbly urine, swelling of the ankles, hands, or feet Liver injury--right upper belly pain, loss of appetite, nausea, light-colored stool, dark yellow or brown urine, yellowing skin or eyes,  unusual weakness or fatigue Pain, tingling, or numbness in the hands or feet, muscle weakness, change in vision, confusion or trouble speaking, loss of balance or coordination, trouble walking, seizures Rash, fever, and swollen lymph nodes Redness, blistering, peeling, or loosening of the skin, including inside the mouth Sudden or severe stomach pain, bloody diarrhea, fever, nausea, vomiting Side effects that usually do not require medical attention (report these to your care team  if they continue or are bothersome): Bone, joint, or muscle pain Diarrhea Fatigue Loss of appetite Nausea Skin rash This list may not describe all possible side effects. Call your doctor for medical advice about side effects. You may report side effects to FDA at 1-800-FDA-1088. Where should I keep my medication? This medication is given in a hospital or clinic. It will not be stored at home. NOTE: This sheet is a summary. It may not cover all possible information. If you have questions about this medicine, talk to your doctor, pharmacist, or health care provider.  2024 [employer]vier/Gold Standard (2021-11-19 00:00:00)

## 2024-05-23 NOTE — Assessment & Plan Note (Signed)
 Follow up with [name].  Continue keppra 

## 2024-05-23 NOTE — Assessment & Plan Note (Signed)
 Off bevacizumab  04/11/2024 MRI brain w wo contrast results were reviewed. He is asymptomatic.  Follow up  with [name].

## 2024-05-23 NOTE — Assessment & Plan Note (Signed)
Durvalumab treatment as planned.  

## 2024-05-23 NOTE — Assessment & Plan Note (Signed)
 oral potassium 20meq daily, stable K

## 2024-05-25 ENCOUNTER — Other Ambulatory Visit: Payer: Self-pay

## 2024-05-25 ENCOUNTER — Other Ambulatory Visit (INDEPENDENT_AMBULATORY_CARE_PROVIDER_SITE_OTHER): Payer: Self-pay | Admitting: Nurse Practitioner

## 2024-05-25 ENCOUNTER — Other Ambulatory Visit: Payer: Self-pay | Admitting: Oncology

## 2024-05-25 ENCOUNTER — Other Ambulatory Visit (HOSPITAL_COMMUNITY): Payer: Self-pay

## 2024-05-25 MED ORDER — CLOPIDOGREL BISULFATE 75 MG PO TABS
75.0000 mg | ORAL_TABLET | Freq: Every day | ORAL | 2 refills | Status: DC
  Filled 2024-05-25: qty 100, 100d supply, fill #0
  Filled 2024-08-15: qty 100, 100d supply, fill #1
  Filled 2024-08-16: qty 100, 100d supply, fill #0

## 2024-05-25 MED FILL — Levetiracetam Tab 1000 MG: ORAL | 30 days supply | Qty: 60 | Fill #2 | Status: AC

## 2024-05-27 ENCOUNTER — Other Ambulatory Visit (HOSPITAL_COMMUNITY): Payer: Self-pay

## 2024-05-27 MED ORDER — LEVOTHYROXINE SODIUM 125 MCG PO TABS
125.0000 ug | ORAL_TABLET | Freq: Every day | ORAL | 0 refills | Status: DC
  Filled 2024-05-27 – 2024-06-21 (×2): qty 90, 90d supply, fill #0

## 2024-05-30 ENCOUNTER — Other Ambulatory Visit (HOSPITAL_COMMUNITY): Payer: Self-pay

## 2024-06-06 ENCOUNTER — Inpatient Hospital Stay

## 2024-06-06 ENCOUNTER — Encounter: Payer: Self-pay | Admitting: Oncology

## 2024-06-06 ENCOUNTER — Inpatient Hospital Stay: Admitting: Oncology

## 2024-06-06 ENCOUNTER — Encounter (HOSPITAL_COMMUNITY): Payer: Self-pay

## 2024-06-06 ENCOUNTER — Other Ambulatory Visit (HOSPITAL_COMMUNITY): Payer: Self-pay

## 2024-06-06 VITALS — BP 127/67 | HR 71 | Temp 96.0°F | Resp 19

## 2024-06-06 VITALS — BP 136/75 | HR 86 | Resp 18 | Wt 188.1 lb

## 2024-06-06 DIAGNOSIS — R569 Unspecified convulsions: Secondary | ICD-10-CM

## 2024-06-06 DIAGNOSIS — E032 Hypothyroidism due to medicaments and other exogenous substances: Secondary | ICD-10-CM | POA: Diagnosis not present

## 2024-06-06 DIAGNOSIS — C7931 Secondary malignant neoplasm of brain: Secondary | ICD-10-CM | POA: Diagnosis not present

## 2024-06-06 DIAGNOSIS — C3491 Malignant neoplasm of unspecified part of right bronchus or lung: Secondary | ICD-10-CM

## 2024-06-06 DIAGNOSIS — E876 Hypokalemia: Secondary | ICD-10-CM | POA: Diagnosis not present

## 2024-06-06 DIAGNOSIS — Z5112 Encounter for antineoplastic immunotherapy: Secondary | ICD-10-CM

## 2024-06-06 LAB — COMPREHENSIVE METABOLIC PANEL WITH GFR
ALT: 14 U/L (ref 0–44)
AST: 26 U/L (ref 15–41)
Albumin: 3.7 g/dL (ref 3.5–5.0)
Alkaline Phosphatase: 67 U/L (ref 38–126)
Anion gap: 10 (ref 5–15)
BUN: 10 mg/dL (ref 8–23)
CO2: 22 mmol/L (ref 22–32)
Calcium: 8.6 mg/dL — ABNORMAL LOW (ref 8.9–10.3)
Chloride: 104 mmol/L (ref 98–111)
Creatinine, Ser: 0.86 mg/dL (ref 0.61–1.24)
GFR, Estimated: >60 mL/min (ref >60)
Glucose, Bld: 162 mg/dL — ABNORMAL HIGH (ref 70–99)
Potassium: 3.2 mmol/L — ABNORMAL LOW (ref 3.5–5.1)
Sodium: 136 mmol/L (ref 135–145)
Total Bilirubin: 0.4 mg/dL (ref 0.0–1.2)
Total Protein: 6.9 g/dL (ref 6.5–8.1)

## 2024-06-06 LAB — CBC WITH DIFFERENTIAL/PLATELET
Abs Immature Granulocytes: 0.01 K/uL (ref 0.00–0.07)
Basophils Absolute: 0.1 K/uL (ref 0.0–0.1)
Basophils Relative: 1 %
Eosinophils Absolute: 0.3 K/uL (ref 0.0–0.5)
Eosinophils Relative: 6 %
HCT: 36.4 % — ABNORMAL LOW (ref 39.0–52.0)
Hemoglobin: 11.2 g/dL — ABNORMAL LOW (ref 13.0–17.0)
Immature Granulocytes: 0 %
Lymphocytes Relative: 19 %
Lymphs Abs: 1 K/uL (ref 0.7–4.0)
MCH: 23.6 pg — ABNORMAL LOW (ref 26.0–34.0)
MCHC: 30.8 g/dL (ref 30.0–36.0)
MCV: 76.6 fL — ABNORMAL LOW (ref 80.0–100.0)
Monocytes Absolute: 0.5 K/uL (ref 0.1–1.0)
Monocytes Relative: 8 %
Neutro Abs: 3.6 K/uL (ref 1.7–7.7)
Neutrophils Relative %: 66 %
Platelets: 225 K/uL (ref 150–400)
RBC: 4.75 MIL/uL (ref 4.22–5.81)
RDW: 16.8 % — ABNORMAL HIGH (ref 11.5–15.5)
WBC: 5.5 K/uL (ref 4.0–10.5)
nRBC: 0 % (ref 0.0–0.2)

## 2024-06-06 MED ORDER — SODIUM CHLORIDE 0.9 % IV SOLN
Freq: Once | INTRAVENOUS | Status: AC
  Filled 2024-06-06: qty 250

## 2024-06-06 MED ORDER — SIMVASTATIN 40 MG PO TABS
40.0000 mg | ORAL_TABLET | Freq: Every day | ORAL | 0 refills | Status: DC
  Filled 2024-06-06: qty 30, 30d supply, fill #0

## 2024-06-06 MED ORDER — SODIUM CHLORIDE 0.9 % IV SOLN
10.0000 mg/kg | Freq: Once | INTRAVENOUS | Status: AC
  Administered 2024-06-06: 860 mg via INTRAVENOUS
  Filled 2024-06-06: qty 7.2

## 2024-06-06 NOTE — Progress Notes (Signed)
 Hematology/Oncology Progress note [phone] [phone]    CHIEF COMPLAINTS/REASON FOR VISIT:  Follow up for lung cancer  ASSESSMENT & PLAN:   Cancer Staging  Squamous cell carcinoma of right lung (HCC) Staging form: Lung, AJCC 8th Edition - Clinical stage from 09/20/2020: Stage IV (cT1b, cN2, cM1) - Signed by [name], MD on 12/13/2021   Squamous cell carcinoma of right lung (HCC) Durvalumab  was previously held due to pneumonitis.  Labs are reviewed and discussed with patient. Proceed with Durvalumab     Encounter for antineoplastic immunotherapy Durvalumab  treatment as planned.   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 He is asymptomatic.  Follow up  with [name].   Seizure (HCC) Follow up with [name].  Continue keppra      Follow up  2 weeks lab MD Durvalumab   All questions were answered. The patient knows to call the clinic with any problems, questions or concerns.  Call Babara, MD, PhD [facility] Hematology Oncology 06/06/2024       HISTORY OF PRESENTING ILLNESS:  Joshua [name] is a 68 y.o. male who has above history reviewed by me today presents for follow up visit for Stage IV lung squamous cell carcinoma. Oncology History  Squamous cell carcinoma of right lung (HCC)  09/18/2020 Imaging   PET scan showed right upper lobe nodule maximal SUV 11.5.  Hypermetabolic cluster right paratracheal adenopathy with maximum SUV up to 12.  No findings of metastatic disease to the neck/abdomen/pelvis or skeleton.   09/20/2020 Initial Diagnosis   Squamous cell carcinoma of right lung (HCC) NGS showed PD-L1 TPS 95%, LRP1B S997fs, PIK3CA E545K, RB1 c1422-2A>T, RIT1 T83_ A84del, STK11 P281fs, TPS53  R248W, TMB 19.24mut/mb, MS stable.    09/20/2020 Cancer Staging   Staging form: Lung, AJCC 8th Edition - Clinical stage from 09/20/2020: Stage IV (cT1b, cN2, cM1) - Signed by [name], MD on 12/13/2021   10/03/2020 Imaging   MRI brain with and without contrast showed no evidence of intracranial metastatic disease. Well-defined round lesion within the left parotid tail measuring approximately 2.3 x 1.8 x 1.8 cm   10/09/2020 - 11/27/2020 Radiation Therapy   concurrent chemoradiation   10/10/2020 - 10/31/2020 Chemotherapy   10/10/2020 concurrent chemoradiation.  carboplatin  AUC of 2 and Taxol  45 mg/m2 x 4.  Additional chemotherapy was held due to thrombocytopenia, neutropenia       12/31/2020 - 03/21/2022 Chemotherapy   Maintenance  Durvalumab  q14d      12/31/2020 -  Chemotherapy   Patient is on Treatment Plan : LUNG Durvalumab  (10) q14d     03/28/2021 Imaging   CT without contrast showed new linear right perihilar consolidation with associated traction bronchiectasis.  Likely postradiation changes.  Additional new peribronchial vascular/nodular groundglass opacities with areas of consolidation or seen primarily in the right upper and lower lobes, more distant from expected radiation fields. 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  Per my discussion with pulmonology Dr. 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     05/09/2021 Imaging   Repeat CT chest without contrast showed evolving radiation changes in the right perihilar region likely accounting for interval increase to thickening of the posterior wall  of the right upper lobe bronchus.  The more peripheral right lung groundglass opacities seen on most recent study have generally improved with exception of one peripherally right upper lobe which appears new.  These are likely inflammatory.  No findings highly suspicious  for local recurrence.   05/30/2021 - 01/10/2022 Chemotherapy   Durvalumab  q14d     10/07/2021 Imaging   MRI brain with and without contrast showed 2 contrast-enhancing lesions, most consistent with metastatic disease.  Largest lesion is in the left frontal lobe and measures up to 1.3 cm with a large amount of surrounding edema and 9 mm rightward midline shift.  The other enhancing lesion was 4 mm in the right occipital lobe with a small amount of surrounding edema.  No acute or chronic hemorrhage.   10/07/2021 Progression   Headache after colonoscopy, progressive difficulty using her right arm.  Progressively worsening of speech. Stage IV lung cancer with brain metastasis.  started on Dexamethasone .  He was seen by neurosurgery and radiation oncology.  Recommendation is to proceed with SRS.  Patient was discharged with dexamethasone    10/16/2021 Imaging   PET scan showed postradiation changes in the right supra hilar lung without evidence of local recurrence.  No evidence of lung cancer recurrence or metastatic disease.  Single focus of intense metabolic activity associated with posterior left 11th rib likely secondary to recent fall/posttraumatic metabolic changes.  Frontal  uptake in the left frontal lobe corresponding to the known metastatic lesion in the brain   10/29/2021 -  Radiation Therapy   Brain Radiation [facility]   12/10/2021 Imaging   Brain MRI: Continued interval decrease in size of a left frontal lobe metastasis, now measuring 10 mm. Moderate surrounding edema has also decreased.  3-4 mm metastasis within the right occipital lobe, not significantly changed in size. There is now a small focus of central non enhancement within this lesion. Increased edema. 3 mm enhancing metastasis within the high left parietal lobe. In retrospect, this was likely present as a subtle punctate focus of enhancement on the prior brain MRI of 10/22/2021.   01/18/2022 Imaging   CT chest abdomen pelvis w contrast 1.  Stable post treatment changes in the right upper lobe medially with dense radiation fibrosis but no findings to suggest local recurrent tumor. 2. Significant progression of ill-defined ground-glass opacity, interstitial thickening and airspace nodularity in the right upper lobe and upper aspect of the right lower lobe. Findings could reflect an inflammatory or atypical infectious process, drug induced pneumonitis or interstitial spread of tumor.3. No mediastinal or hilar mass or adenopathy. 4. No findings for abdominal/pelvic metastatic disease or osseous metastatic disease.   02/19/2022 Imaging   MRI brain w wo contrast showed Three metastatic deposits in the brain appear smaller. Decreased edema in the left parietal lobe. No new lesions    04/04/2022 Imaging   CT chest w contrast 1. Stable radiation changes in the right perihilar region without evidence of local recurrence. 2. Interval improvement in the patchy ground-glass opacities previously demonstrated in both lungs, most consistent with a resolving infectious or inflammatory process (including immunotherapy-related pneumonitis). 3. No evidence of metastatic disease.   05/14/2022 Miscellaneous   He had a mechanical fall and fractured his left elbow. xrays showed a left elbow fracture  05/26/22 seen by orthopedic surgeon, cast applied.    05/27/2022 Imaging   MRI brain w wo contrast 1. All three known brain metastases have enlarged since August, with increasing regional edema. And there is a small  new left anterior cerebellar 4-5 mm metastasis (was questionably punctate on the May MRI). This constellation could reflect a combination of True Progression (Left Cerebellar lesion) and pseudoprogression/radionecrosis (3 existing mets).    2. No significant intracranial mass effect. 3. Underlying moderate cerebral white matter signal changes.   05/30/2022 Imaging   CT chest abdomen pelvis w contrast  1. Stable examination demonstrating  chronic postradiation mass-like fibrosis in the right lung with resolving postradiation pneumonitis in the adjacent lung parenchyma. There is a stable small right pleural effusion, but no definitive findings to suggest residual or recurrent disease on today's examination. 2. No signs of metastatic disease in the abdomen or pelvis.  3. Aortic atherosclerosis, in addition to two-vessel coronary artery disease. Please note that although the presence of coronary artery calcium  documents the presence of coronary artery disease, the severity of this disease and any potential stenosis cannot be assessed on this non-gated CT examination. Assessment for potential risk factor modification, dietary therapy or pharmacologic therapy may be warranted, if clinically indicated. 4. Hepatic steatosis. 5. Colonic diverticulosis without evidence of acute diverticulitis at this time. 6. Incompletely imaged but apparently occluded vascular graft in the superior aspect of the right anterior thigh. Referral to vascular specialist for further clinical evaluation should be considered if clinically appropriate. 7. Additional incidental findings, as above.   07/22/2022 Imaging   MRI brain  1. No new or enlarging lesions. No intracranial disease progression. 2. Decreased size of treated lesions of the left frontoparietal junction and left postcentral gyrus. 3. Unchanged lesions in the left cerebellum and right occipital lobe.   10/15/2022 Imaging   MRI brain w wo contrast showed Interval increase in size of multiple intraparenchymal lesions with worsened surrounding vasogenic edema. 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 No new lesions   11/05/2022 Imaging   MRI brain w wo contrast  1. Likely slight increase and right occipital and left cerebellar lesions. Otherwise, unchanged metastases, as above. No new lesions identified. 2. Partially imaged  large probable lymph node in the left upper neck, apparent on prior PET CT.   11/13/2022 -  Radiation Therapy   Each of the sites were treated with SRS/SBRT/SRT-IMRT technique to 20 Gy in 1 fraction to each of the following sites: PTV_2_LCerebel_8mm PTV_3_RCerebel_72mm PTV_4_ROccip_76mm PTV_5_LParietal_2mm   11/27/2022 Imaging   CT chest abdomen pelvis w contrast showed  Improving bilateral scattered interstitial and ground-glass opacities. Small amount of residual areas in the extreme left lower lobe inferiorly. This specific area is slightly increased from previous but overall the amount of opacities is significantly improved. Recommend continued follow-up. No developing new mass lesion, fluid collection or lymph node enlargement. Stable posttreatment changes right perihilar in the lung. Fatty liver infiltration. Colonic diverticulosis. Improved tiny right pleural effusion   02/05/2023 Imaging   MRI brain with and without contrast Interval decrease in all of the metastatic lesions, detailed above.No new lesions identified.    Imaging   CT chest abdomen pelvis w contrast showed  1. 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 2. Trace right pleural fluid or thickening, new or minimally increased. 3. Incidental findings, including: Aortic atherosclerosis (ICD10-I70.0), coronary artery atherosclerosis and emphysema (ICD10-J43.9). Hepatic steatosis.   06/25/2023 Imaging   CT chest abdomen pelvis w contrast showed 1. Unchanged post treatment/post radiation appearance of the right lung, with perihilar and suprahilar paramedian fibrosis and consolidation. 2. No evidence of lymphadenopathy or metastatic  disease in the chest, abdomen, or pelvis. 3. Hepatic steatosis. 4. Coronary artery disease.   Aortic Atherosclerosis (ICD10-I70.0) and Emphysema (ICD10-J43.9).   08/26/2023 [facility] Admission   hospitalization due to was  observed speech and jerking movements on right side despite being on dexamethasone . Continuous EEG suggest cortical dysfunction arising from left temporal region.  No seizure or elective form discharges were seen. Patient was discharged on dexamethasone  4 mg, Keppra , lamotrigine .    11/04/2023 -  Chemotherapy   Add Bevacizumab  5mg /kg to Durvalumab .     11/17/2023 - 11/27/2023 Radiation Therapy   Patient received radiation to brain for treatment of recurrence.   02/23/2024 Imaging   CT chest abdomen pelvis w contrast showed  CHEST:   1. Post RIGHT upper lobe resection. 2. No evidence of lung cancer recurrence. 3. No mediastinal adenopathy.   PELVIS:   1. No evidence of metastatic disease in the abdomen pelvis. 2. No skeletal metastasis.     INTERVAL HISTORY DONTREAL MIERA is a 68 y.o. male who has above history reviewed by me today presents for follow up visit for right lung RCC.  chronic SOB,not worse.  Peripheral artery disease,Patient is on aspirin  and Plavix . No new complaints   Review of Systems  Constitutional:  Positive for fatigue. Negative for appetite change, chills, fever and unexpected weight change.  HENT:   Negative for hearing loss and voice change.   Eyes:  Negative for eye problems and icterus.  Respiratory:  Negative for chest tightness, cough and shortness of breath.   Cardiovascular:  Positive for leg swelling. Negative for chest pain.  Gastrointestinal:  Negative for abdominal distention, abdominal pain, nausea and vomiting.  Endocrine: Negative for hot flashes.  Genitourinary:  Negative for difficulty urinating, dysuria and frequency.   Musculoskeletal:  Positive for gait problem. Negative for arthralgias.  Skin:  Negative for itching and rash.  Neurological:  Positive for gait problem. Negative for extremity weakness, headaches, numbness and speech difficulty.  Hematological:  Negative for adenopathy. Bruises/bleeds easily.  Psychiatric/Behavioral:   Negative for confusion.       MEDICAL HISTORY:  Past Medical History:  Diagnosis Date   Allergy    Cancer (HCC)    lung   COPD (chronic obstructive pulmonary disease) (HCC)    Dyspnea    former smoker. 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   Malignant neoplasm metastatic to brain [facility])    Nausea with vomiting 02/01/2024   Peripheral vascular disease    Pre-diabetes    Sleep apnea     SURGICAL HISTORY: Past Surgical History:  Procedure Laterality Date   ANTERIOR CERVICAL DECOMP/DISCECTOMY FUSION N/A 03/14/2020   Procedure: ANTERIOR CERVICAL DECOMPRESSION/DISCECTOMY FUSION 3 LEVELS C4-7;  Surgeon: Clois Fret, MD;  Location: [facility] ORS;  Service: Neurosurgery;  Laterality: N/A;   BACK SURGERY  2011   CENTRAL LINE INSERTION Right 09/10/2016   Procedure: CENTRAL LINE INSERTION;  Surgeon: Jama Cordella MATSU, MD;  Location: [facility] ORS;  Service: Vascular;  Laterality: Right;   CERVICAL FUSION     C4-c7 on Mar 14, 2020   COLONOSCOPY  2013 ?   COLONOSCOPY N/A 09/09/2021   Procedure: COLONOSCOPY;  Surgeon: Onita Elspeth Sharper, DO;  Location: [facility] ENDOSCOPY;  Service: Gastroenterology;  Laterality: N/A;   CORONARY ANGIOPLASTY     ESOPHAGOGASTRODUODENOSCOPY N/A 09/09/2021   Procedure: ESOPHAGOGASTRODUODENOSCOPY (EGD);  Surgeon: Onita Elspeth Sharper, DO;  Location: [facility] ENDOSCOPY;  Service:  Gastroenterology;  Laterality: N/A;   FEMORAL-POPLITEAL BYPASS GRAFT Right 09/10/2016   Procedure: BYPASS GRAFT FEMORAL-POPLITEAL ARTERY ( BELOW KNEE );  Surgeon: Jama Cordella MATSU, MD;  Location: [facility] ORS;  Service: Vascular;  Laterality: Right;   FEMORAL-TIBIAL BYPASS GRAFT Right 03/23/2020   Procedure: BYPASS GRAFT RIGHT FEMORAL- DISTAL TIBIAL ARTERY WITH DISTAL FLOW GRAFT;  Surgeon: Jama Cordella MATSU, MD;  Location: [facility] ORS;  Service: Vascular;  Laterality: Right;   LEFT HEART CATH AND CORONARY ANGIOGRAPHY Left 05/16/2021    Procedure: LEFT HEART CATH AND CORONARY ANGIOGRAPHY;  Surgeon: Lawyer Bernardino Cough, MD;  Location: [facility] INVASIVE CV LAB;  Service: Cardiovascular;  Laterality: Left;   LOWER EXTREMITY ANGIOGRAPHY Right 11/18/2016   Procedure: Lower Extremity Angiography;  Surgeon: Cordella MATSU Jama, MD;  Location: [facility] INVASIVE CV LAB;  Service: Cardiovascular;  Laterality: Right;   LOWER EXTREMITY ANGIOGRAPHY Right 12/09/2016   Procedure: Lower Extremity Angiography;  Surgeon: Jama Cordella MATSU, MD;  Location: [facility] INVASIVE CV LAB;  Service: Cardiovascular;  Laterality: Right;   LOWER EXTREMITY ANGIOGRAPHY Right 11/15/2019   Procedure: LOWER EXTREMITY ANGIOGRAPHY;  Surgeon: Jama Cordella MATSU, MD;  Location: [facility] INVASIVE CV LAB;  Service: Cardiovascular;  Laterality: Right;   LOWER EXTREMITY ANGIOGRAPHY Right 12/01/2019   Procedure: Lower Extremity Angiography;  Surgeon: Marea Selinda RAMAN, MD;  Location: [facility] INVASIVE CV LAB;  Service: Cardiovascular;  Laterality: Right;   LOWER EXTREMITY ANGIOGRAPHY Right 12/02/2019   Procedure: LOWER EXTREMITY ANGIOGRAPHY;  Surgeon: Jama Cordella MATSU, MD;  Location: [facility] INVASIVE CV LAB;  Service: Cardiovascular;  Laterality: Right;   LOWER EXTREMITY ANGIOGRAPHY Right 03/23/2020   Procedure: Lower Extremity Angiography;  Surgeon: Jama Cordella MATSU, MD;  Location: [facility] INVASIVE CV LAB;  Service: Cardiovascular;  Laterality: Right;   LOWER EXTREMITY ANGIOGRAPHY Right 09/25/2021   Procedure: Lower Extremity Angiography;  Surgeon: Jama Cordella MATSU, MD;  Location: [facility] INVASIVE CV LAB;  Service: Cardiovascular;  Laterality: Right;   LOWER EXTREMITY ANGIOGRAPHY Right 09/26/2021   Procedure: Lower Extremity Angiography;  Surgeon: Marea Selinda RAMAN, MD;  Location: [facility] INVASIVE CV LAB;  Service: Cardiovascular;  Laterality: Right;   LOWER EXTREMITY ANGIOGRAPHY Right 04/08/2022   Procedure: Lower Extremity Angiography;  Surgeon: Jama Cordella MATSU, MD;  Location: [facility] INVASIVE CV LAB;  Service:  Cardiovascular;  Laterality: Right;   LOWER EXTREMITY ANGIOGRAPHY Right 03/04/2024   Procedure: Lower Extremity Angiography;  Surgeon: Jama Cordella MATSU, MD;  Location: [facility] INVASIVE CV LAB;  Service: Cardiovascular;  Laterality: Right;   LOWER EXTREMITY ANGIOGRAPHY Right 03/25/2024   Procedure: Lower Extremity Angiography;  Surgeon: Jama Cordella MATSU, MD;  Location: [facility] INVASIVE CV LAB;  Service: Cardiovascular;  Laterality: Right;   LOWER EXTREMITY INTERVENTION  11/18/2016   Procedure: Lower Extremity Intervention;  Surgeon: Cordella MATSU Jama, MD;  Location: [facility] INVASIVE CV LAB;  Service: Cardiovascular;;   LOWER EXTREMITY INTERVENTION Right 03/04/2024   Procedure: LOWER EXTREMITY INTERVENTION;  Surgeon: Jama Cordella MATSU, MD;  Location: [facility] INVASIVE CV LAB;  Service: Cardiovascular;  Laterality: Right;   PERIPHERAL VASCULAR CATHETERIZATION Right 01/02/2015   Procedure: Lower Extremity Angiography;  Surgeon: Cordella MATSU Jama, MD;  Location: [facility] INVASIVE CV LAB;  Service: Cardiovascular;  Laterality: Right;   PERIPHERAL VASCULAR CATHETERIZATION Right 06/18/2015   Procedure: Lower Extremity Angiography;  Surgeon: Selinda RAMAN Marea, MD;  Location: [facility] INVASIVE CV LAB;  Service: Cardiovascular;  Laterality: Right;   PERIPHERAL VASCULAR CATHETERIZATION  06/18/2015   Procedure: Lower Extremity Intervention;  Surgeon: Selinda RAMAN Marea, MD;  Location: [facility] INVASIVE CV  LAB;  Service: Cardiovascular;;   PERIPHERAL VASCULAR CATHETERIZATION N/A 10/09/2015   Procedure: Abdominal Aortogram w/Lower Extremity;  Surgeon: Cordella KANDICE Shawl, MD;  Location: [facility] INVASIVE CV LAB;  Service: Cardiovascular;  Laterality: N/A;   PERIPHERAL VASCULAR CATHETERIZATION  10/09/2015   Procedure: Lower Extremity Intervention;  Surgeon: Cordella KANDICE Shawl, MD;  Location: [facility] INVASIVE CV LAB;  Service: Cardiovascular;;   PERIPHERAL VASCULAR CATHETERIZATION Right 10/10/2015   Procedure: Lower Extremity Angiography;  Surgeon: Selinda GORMAN Gu, MD;   Location: [facility] INVASIVE CV LAB;  Service: Cardiovascular;  Laterality: Right;   PERIPHERAL VASCULAR CATHETERIZATION Right 10/10/2015   Procedure: Lower Extremity Intervention;  Surgeon: Selinda GORMAN Gu, MD;  Location: [facility] INVASIVE CV LAB;  Service: Cardiovascular;  Laterality: Right;   PERIPHERAL VASCULAR CATHETERIZATION Right 12/03/2015   Procedure: Lower Extremity Angiography;  Surgeon: Selinda GORMAN Gu, MD;  Location: [facility] INVASIVE CV LAB;  Service: Cardiovascular;  Laterality: Right;   PERIPHERAL VASCULAR CATHETERIZATION Right 12/04/2015   Procedure: Lower Extremity Angiography;  Surgeon: Selinda GORMAN Gu, MD;  Location: [facility] INVASIVE CV LAB;  Service: Cardiovascular;  Laterality: Right;   PERIPHERAL VASCULAR CATHETERIZATION  12/04/2015   Procedure: Lower Extremity Intervention;  Surgeon: Selinda GORMAN Gu, MD;  Location: [facility] INVASIVE CV LAB;  Service: Cardiovascular;;   PERIPHERAL VASCULAR CATHETERIZATION Right 06/30/2016   Procedure: Lower Extremity Angiography;  Surgeon: Selinda GORMAN Gu, MD;  Location: [facility] INVASIVE CV LAB;  Service: Cardiovascular;  Laterality: Right;   PERIPHERAL VASCULAR CATHETERIZATION Right 06/25/2016   Procedure: Lower Extremity Angiography;  Surgeon: Cordella KANDICE Shawl, MD;  Location: [facility] INVASIVE CV LAB;  Service: Cardiovascular;  Laterality: Right;   PERIPHERAL VASCULAR CATHETERIZATION Right 07/01/2016   Procedure: Lower Extremity Angiography;  Surgeon: Cordella KANDICE Shawl, MD;  Location: [facility] INVASIVE CV LAB;  Service: Cardiovascular;  Laterality: Right;   PERIPHERAL VASCULAR CATHETERIZATION  07/01/2016   Procedure: Lower Extremity Intervention;  Surgeon: Cordella KANDICE Shawl, MD;  Location: [facility] INVASIVE CV LAB;  Service: Cardiovascular;;   PERIPHERAL VASCULAR CATHETERIZATION Right 08/01/2016   Procedure: Lower Extremity Angiography;  Surgeon: Cordella KANDICE Shawl, MD;  Location: [facility] INVASIVE CV LAB;  Service: Cardiovascular;  Laterality: Right;   PORTA CATH INSERTION N/A 10/05/2020   Procedure:  PORTA CATH INSERTION;  Surgeon: Shawl Cordella KANDICE, MD;  Location: [facility] INVASIVE CV LAB;  Service: Cardiovascular;  Laterality: N/A;   stent placement in right leg Right    VIDEO BRONCHOSCOPY N/A 09/14/2020   Procedure: VIDEO BRONCHOSCOPY WITH FLUORO;  Surgeon: Tamea Dedra CROME, MD;  Location: [facility] ORS;  Service: Cardiopulmonary;  Laterality: N/A;   VIDEO BRONCHOSCOPY WITH ENDOBRONCHIAL ULTRASOUND N/A 09/14/2020   Procedure: VIDEO BRONCHOSCOPY WITH ENDOBRONCHIAL ULTRASOUND;  Surgeon: Tamea Dedra CROME, MD;  Location: [facility] ORS;  Service: Cardiopulmonary;  Laterality: N/A;    SOCIAL HISTORY: Social History   Socioeconomic History   Marital status: Single    Spouse name: Not on file   Number of children: Not on file   Years of education: Not on file   Highest education level: Not on file  Occupational History   Occupation: unemployed  Tobacco Use   Smoking status: Former    Current packs/day: 0.00    Average packs/day: 2.0 packs/day for 38.0 years (76.0 ttl pk-yrs)    Types: Cigarettes    Start date: 12/19/1976    Quit date: 12/20/2014    Years since quitting: 9.4   Smokeless tobacco: Former   Tobacco comments:    quit smoking in 2017 most smoked 2   Vaping  Use   Vaping status: Never Used  Substance and Sexual Activity   Alcohol  use: Not Currently   Drug use: No   Sexual activity: Yes  Other Topics Concern   Not on file  Social History Narrative   Not on file   Social Drivers of Health   Financial Resource Strain: Medium Risk (02/05/2024)   Overall Financial Resource Strain (CARDIA)    Difficulty of Paying Living Expenses: Somewhat hard  Food Insecurity: No Food Insecurity (03/25/2024)   Hunger Vital Sign    Worried About Running Out of Food in the Last Year: Never true    Ran Out of Food in the Last Year: Never true  Transportation Needs: No Transportation Needs (03/25/2024)   PRAPARE - Administrator, Civil Service (Medical): No    Lack of Transportation  (Non-Medical): No  Physical Activity: Inactive (02/05/2024)   Exercise Vital Sign    Days of Exercise per Week: 1 day    Minutes of Exercise per Session: 0 min  Stress: No Stress Concern Present (02/05/2024)   [employer] of Occupational Health - Occupational Stress Questionnaire    Feeling of Stress: Only a little  Social Connections: Socially Isolated (03/25/2024)   Social Connection and Isolation Panel    Frequency of Communication with Friends and Family: Three times a week    Frequency of Social Gatherings with Friends and Family: Once a week    Attends Religious Services: Never    Database Administrator or Organizations: No    Attends Banker Meetings: Never    Marital Status: Never married  Intimate Partner Violence: Not At Risk (03/25/2024)   Humiliation, Afraid, Rape, and Kick questionnaire    Fear of Current or Ex-Partner: No    Emotionally Abused: No    Physically Abused: No    Sexually Abused: No    FAMILY HISTORY: Family History  Problem Relation Age of Onset   Diabetes Mother    Hypertension Mother    Heart murmur Mother    Leukemia Mother    Throat cancer Maternal Grandmother    Colon cancer Neg Hx    Breast cancer Neg Hx     ALLERGIES:  has no known allergies.  MEDICATIONS:  Current Outpatient Medications  Medication Sig Dispense Refill   aspirin  EC 81 MG tablet Take 81 mg by mouth daily. Swallow whole.     clopidogrel  (PLAVIX ) 75 MG tablet Take 1 tablet (75 mg total) by mouth daily. 100 tablet 2   levETIRAcetam  (KEPPRA ) 1000 MG tablet Take 1 tablet (1,000 mg total) by mouth 2 (two) times daily. 60 tablet 2   levothyroxine  (SYNTHROID ) 125 MCG tablet Take 1 tablet (125 mcg total) by mouth daily before breakfast. 90 tablet 0   potassium chloride  SA (KLOR-CON  M) 20 MEQ tablet Take 1 tablet (20 mEq total) by mouth daily. 30 tablet 2   simvastatin  (ZOCOR ) 40 MG tablet Take 40 mg by mouth daily.     simvastatin  (ZOCOR ) 40 MG tablet Take 1 tablet  (40 mg total) by mouth daily. 30 tablet 0   No current facility-administered medications for this visit.   Facility-Administered Medications Ordered in Other Visits  Medication Dose Route Frequency Provider Last Rate Last Admin   albuterol  (PROVENTIL ) (2.5 MG/3ML) 0.083% nebulizer solution 2.5 mg  2.5 mg Nebulization Once Tamea Dedra CROME, MD       heparin  lock flush 100 unit/mL  500 Units Intracatheter Once PRN [name], MD  PHYSICAL EXAMINATION: ECOG PERFORMANCE STATUS: 1 - Symptomatic but completely ambulatory Vitals:   06/06/24 0927  BP: 136/75  Pulse: 86  Resp: 18  SpO2: 100%    Filed Weights   06/06/24 0927  Weight: 188 lb 1.6 oz (85.3 kg)     Physical Exam Constitutional:      General: He is not in acute distress.    Appearance: He is obese.  HENT:     Head: Normocephalic and atraumatic.  Eyes:     General: No scleral icterus. Cardiovascular:     Rate and Rhythm: Normal rate and regular rhythm.  Pulmonary:     Effort: Pulmonary effort is normal. No respiratory distress.     Breath sounds: Normal breath sounds. No wheezing.  Abdominal:     General: There is no distension.     Palpations: Abdomen is soft.     Tenderness: There is no abdominal tenderness.  Musculoskeletal:     Cervical back: Normal range of motion and neck supple.     Comments: Right lower extremity wrapped.   Skin:    General: Skin is warm and dry.     Findings: Bruising present. No erythema.  Neurological:     Mental Status: He is alert and oriented to person, place, and time. Mental status is at baseline.     Cranial Nerves: No cranial nerve deficit.     Motor: No abnormal muscle tone.  Psychiatric:        Mood and Affect: Mood and affect normal.         LABORATORY DATA:  I have reviewed the data as listed    Latest Ref Rng & Units 06/06/2024    9:02 AM 05/23/2024    8:30 AM 05/09/2024    8:55 AM  CBC  WBC 4.0 - 10.5 K/uL 5.5  5.7  6.3   Hemoglobin 13.0 - 17.0  g/dL 88.7  88.7  88.9   Hematocrit 39.0 - 52.0 % 36.4  35.9  34.8   Platelets 150 - 400 K/uL 225  248  244       Latest Ref Rng & Units 06/06/2024    9:02 AM 05/23/2024    8:30 AM 05/09/2024    8:55 AM  CMP  Glucose 70 - 99 mg/dL 837  874  874   BUN 8 - 23 mg/dL 10  15  12    Creatinine 0.61 - 1.24 mg/dL 9.13  9.08  9.18   Sodium 135 - 145 mmol/L 136  139  139   Potassium 3.5 - 5.1 mmol/L 3.2  3.6  3.6   Chloride 98 - 111 mmol/L 104  106  107   CO2 22 - 32 mmol/L 22  22  23    Calcium  8.9 - 10.3 mg/dL 8.6  9.0  9.1   Total Protein 6.5 - 8.1 g/dL 6.9  7.2  6.6   Total Bilirubin 0.0 - 1.2 mg/dL 0.4  0.8  0.5   Alkaline Phos 38 - 126 U/L 67  61  60   AST 15 - 41 U/L 26  24  29    ALT 0 - 44 U/L 14  14  16       RADIOGRAPHIC STUDIES: I have personally reviewed the radiological images as listed and agreed with the findings in the report. VAS US  LOWER EXTREMITY ARTERIAL DUPLEX Result Date: 04/22/2024 LOWER EXTREMITY ARTERIAL DUPLEX STUDY Patient Name:  COPELAND NEISEN  Date of Exam:   04/21/2024 Medical Rec #: [id number]  Accession #:    [id number] Date of Birth: 1956-03-31           Patient Gender: M Patient Age:   67 years Exam Location:  [facility] Procedure:      VAS US  LOWER EXTREMITY ARTERIAL DUPLEX Referring Phys: [facility] --------------------------------------------------------------------------------  Indications: Ulceration, peripheral artery disease, and Routine f/u. High Risk Factors: Hyperlipidemia. Other Factors: ASO s/p multiple interventions and bypass.                11/15/2019 Right thrombectomy of Fempop insitu graft with added                stent throughout.                 12/01/2019: Aortogram and Selctive Right Lower Extremity                Angiogram. 6 mg of TPA delivered in a lysis catheterfrom the                Origin of the Right Femoral -Popliteal bypass down the proximal                ATA with placement of the lysis.                  12/02/2019: PTA of the Right Posterior Tibial Artery. PTA of the                Right Peroneal Artery. Mechanical Thrombectomy of the Right                Femoral -Popliteal Bypass graft. Mechanical Thrombectomy of the                Right Tibioperoneal Trunk and Posterior Tibial Artery.                 09/25/2021: Right Lower Extremity Angiography third order                catheter placement. PTA of the proximal peroneal and                tibioperoneal trunk to 2.5 mm with an ultra score balloon. PTA of                the Femoral Artery and the proximal anstomosis of the bypass                graft to 4 mm with an ultra score balloon. Administration of 5 mg                of tpa throughout the entire bypass and in the peroneal Artery.                Initiation of continuous tpa infusion Right lower Extremity for                limb salvage. Insertion of a triple lumen catheter Left CFV with                US  and fluoroscopic guidance.                 09/26/2021: Right LE Angiogram. PTA of the Right Posterior Tibial                Artery with 2.5 mm diameter by 30 cm length angioplasty balloon.                PTA of the distal bypass anastomosis  and distal bypass graft                analogous to the Popliteal artery with 4 mm diameter by 8 cm                length Lutonix drug coated angioplasty balloon.                04/08/2022 right thrombectomy of the Deep profunda.  Vascular Interventions: 03/25/2024 Right lower extremity angiography                         3. Atherectomy right common femoral artery,                         femoral-popliteal bypass and distal popliteal arteries                         with the Rota Rex catheter                         4. Ultrasound-guided access to the left common femoral                         artery                         5. Mechanical thrombectomy using the penumbra lightening                         bolt 7 catheter                         6. Percutaneous kissing stents in  the profunda femoris                         and origin of the femoral-popliteal bypass with a 9 x 40                         life star stent in the profunda and a 7 x 100 life stent                         in the femoral-popliteal bypass                         7. Percutaneous angioplasty and stent placement distal                         bypass and popliteal artery with a 6 mm x 40 mm life                         stent.                         8. Percutaneous transluminal angioplasty and stent                         placement right tibioperoneal trunk and posterior tibial                         artery using 3 Esprit  stents postdilated to 4 mm                         proximally and 3 mm distally.                          Hx of multiple right lower extremity PTA's/stents.                         09/10/2016 Right femoral to below the knee popliteal                         artery bypass graft placement. 5/01 Right bypass graft &                         mid popliteal artery PTA & coil embolectomy of large                         branch of bypass graft. 12/09/16 Right distal bypass                         graft PTA/stent and right posterior tibial artery PTA.                         03/23/2020 Rt PTA thrombectomy. Rt Profunda fem artery to                         PTA bypass. Current ABI:            rt = 1.13 Performing Technologist: Jerel Croak RVT  Examination Guidelines: A complete evaluation includes B-mode imaging, spectral Doppler, color Doppler, and power Doppler as needed of all accessible portions of each vessel. Bilateral testing is considered an integral part of a complete examination. Limited examinations for reoccurring indications may be performed as noted.  +-----------+--------+-----+--------+----------+--------+ RIGHT      PSV cm/sRatioStenosisWaveform  Comments +-----------+--------+-----+--------+----------+--------+ CFA Mid    54                   biphasic            +-----------+--------+-----+--------+----------+--------+ DFA        189                  triphasic          +-----------+--------+-----+--------+----------+--------+ SFA Prox                                  BPG      +-----------+--------+-----+--------+----------+--------+ SFA Mid                                   BPG      +-----------+--------+-----+--------+----------+--------+ SFA Distal                                BPG      +-----------+--------+-----+--------+----------+--------+ POP Distal 38                   biphasic           +-----------+--------+-----+--------+----------+--------+ ATA  Distal 19                   monophasic         +-----------+--------+-----+--------+----------+--------+ PTA Distal 48                   triphasic          +-----------+--------+-----+--------+----------+--------+ PERO Distal19                   monophasic         +-----------+--------+-----+--------+----------+--------+  Right Graft #1: FemPop +------------------+--------+--------+---------+--------+                   PSV cm/sStenosisWaveform Comments +------------------+--------+--------+---------+--------+ Inflow            54              biphasic          +------------------+--------+--------+---------+--------+ Prox Anastomosis  52              biphasic          +------------------+--------+--------+---------+--------+ Proximal Graft    64              biphasic          +------------------+--------+--------+---------+--------+ Mid Graft         63              triphasic         +------------------+--------+--------+---------+--------+ Distal Graft      74              biphasic          +------------------+--------+--------+---------+--------+ Distal Anastomosis58              triphasic         +------------------+--------+--------+---------+--------+ Outflow           38              biphasic           +------------------+--------+--------+---------+--------+   Summary: Right: Patent FemPop BPG and new stents throughout. Strong triphasic flow to distal PTA. NL abi  See table(s) above for measurements and observations. Electronically signed by [name] MD on 04/22/2024 at 9:35:02 AM.    Final    VAS US  ABI WITH/WO TBI Result Date: 04/22/2024  LOWER EXTREMITY DOPPLER STUDY Patient Name:  HARSHAN KEARLEY  Date of Exam:   04/21/2024 Medical Rec #: [id number]            Accession #:    [id number] Date of Birth: 16-Mar-1956           Patient Gender: M Patient Age:   60 years Exam Location:  [facility] Procedure:      VAS US  ABI WITH/WO TBI Referring Phys: [facility] --------------------------------------------------------------------------------  Indications: Ulceration, peripheral artery disease, and Routine f/u. High Risk Factors: Hyperlipidemia. Other Factors: ASO s/p multiple interventions and bypass.                11/15/2019 Right thrombectomy of Fempop insitu graft with added                stent throughout.                 12/01/2019: Aortogram and Selctive Right Lower Extremity                Angiogram. 6 mg of TPA delivered in a lysis catheterfrom the  Origin of the Right Femoral -Popliteal bypass down the proximal                ATA with placement of the lysis.                 12/02/2019: PTA of the Right Posterior Tibial Artery. PTA of the                Right Peroneal Artery. Mechanical Thrombectomy of the Right                Femoral -Popliteal Bypass graft. Mechanical Thrombectomy of the                Right Tibioperoneal Trunk and Posterior Tibial Artery.                 09/25/2021: Right Lower Extremity Angiography third order                catheter placement. PTA of the proximal peroneal and                tibioperoneal trunk to 2.5 mm with an ultra score balloon. PTA of                the Femoral Artery and the proximal anstomosis of the bypass                 graft to 4 mm with an ultra score balloon. Administration of 5 mg                of tpa throughout the entire bypass and in the peroneal Artery.                Initiation of continuous tpa infusion Right lower Extremity for                limb salvage. Insertion of a triple lumen catheter Left CFV with                US  and fluoroscopic guidance.                 09/26/2021: Right LE Angiogram. PTA of the Right Posterior Tibial                Artery with 2.5 mm diameter by 30 cm length angioplasty balloon.                PTA of the distal bypass anastomosis and distal bypass graft                analogous to the Popliteal artery with 4 mm diameter by 8 cm                length Lutonix drug coated angioplasty balloon.                04/08/2022 right thrombectomy of the Deep profunda.  Vascular Interventions: 03/25/2024 Right lower extremity angiography                         3. Atherectomy right common femoral artery,                         femoral-popliteal bypass and distal popliteal arteries                         with the Rota Rex catheter  4. Ultrasound-guided access to the left common femoral                         artery                         5. Mechanical thrombectomy using the penumbra lightening                         bolt 7 catheter                         6. Percutaneous kissing stents in the profunda femoris                         and origin of the femoral-popliteal bypass with a 9 x 40                         life star stent in the profunda and a 7 x 100 life stent                         in the femoral-popliteal bypass                         7. Percutaneous angioplasty and stent placement distal                         bypass and popliteal artery with a 6 mm x 40 mm life                         stent.                         8. Percutaneous transluminal angioplasty and stent                         placement right tibioperoneal trunk and posterior tibial                          artery using 3 Esprit stents postdilated to 4 mm                         proximally and 3 mm distally.                          Hx of multiple right lower extremity PTA's/stents.                         09/10/2016 Right femoral to below the knee popliteal                         artery bypass graft placement. 5/01 Right bypass graft &                         mid popliteal artery PTA & coil embolectomy of large                         branch of bypass graft. 12/09/16 Right distal  bypass                         graft PTA/stent and right posterior tibial artery PTA.                         03/23/2020 Rt PTA thrombectomy. Rt Profunda fem artery to                         PTA bypass. Performing Technologist: Jerel Croak RVT  Examination Guidelines: A complete evaluation includes at minimum, Doppler waveform signals and systolic blood pressure reading at the level of bilateral brachial, anterior tibial, and posterior tibial arteries, when vessel segments are accessible. Bilateral testing is considered an integral part of a complete examination. Photoelectric Plethysmograph (PPG) waveforms and toe systolic pressure readings are included as required and additional duplex testing as needed. Limited examinations for reoccurring indications may be performed as noted.  ABI Findings: +---------+------------------+-----+--------+--------+ Right    Rt Pressure (mmHg)IndexWaveformComment  +---------+------------------+-----+--------+--------+ Brachial 128                                     +---------+------------------+-----+--------+--------+ PTA      140               1.08 biphasic         +---------+------------------+-----+--------+--------+ DP       147               1.13 biphasic         +---------+------------------+-----+--------+--------+ Great Toe145               1.12 Normal           +---------+------------------+-----+--------+--------+  +---------+------------------+-----+---------+-------+ Left     Lt Pressure (mmHg)IndexWaveform Comment +---------+------------------+-----+---------+-------+ Brachial 130                                     +---------+------------------+-----+---------+-------+ PTA      145               1.12 triphasic        +---------+------------------+-----+---------+-------+ DP       144               1.11 triphasic        +---------+------------------+-----+---------+-------+ Great Toe152               1.17 Normal           +---------+------------------+-----+---------+-------+ +-------+-----------+-----------+------------+------------+ ABI/TBIToday's ABIToday's TBIPrevious ABIPrevious TBI +-------+-----------+-----------+------------+------------+ Right  1.13       1.12       .37         .26          +-------+-----------+-----------+------------+------------+ Left   1.12       1.17       1.13        .75          +-------+-----------+-----------+------------+------------+  Right TBIs and ABIs appear increased compared to prior study on 02/2024.  Summary: Right: Resting right ankle-brachial index is within normal range. The right toe-brachial index is normal.  Left: Resting left ankle-brachial index is within normal range. The left toe-brachial index is normal.  *See table(s) above for measurements and observations.  Electronically signed by [name] MD  on 04/22/2024 at 9:34:54 AM.    Final    MR BRAIN W WO CONTRAST Result Date: 04/11/2024 CLINICAL DATA:  Brain/CNS neoplasm, assess treatment response EXAM: MRI HEAD WITHOUT AND WITH CONTRAST TECHNIQUE: Multiplanar, multiecho pulse sequences of the brain and surrounding structures were obtained without and with intravenous contrast. CONTRAST:  9 mL of Vueway  COMPARISON:  MRI head January 04, 2024. FINDINGS: Brain: Slight increase in size of a centrally necrotic 9 mm lesion in the right occipital lobe. Similar versus slightly  decreased necrotic left parietal lesion which measures approximately 2.1 x 1.9 cm. Similar versus slightly improved/decreased 1.1 x 1.1 cm lesion in the left frontal operculum. Unchanged punctate focus of enhancement in the left cerebellum (series 14, image 42). Edema surrounding these lesions is not substantially changed. No progressive mass effect. No midline shift. No evidence of acute infarct, acute hemorrhage, or hydrocephalus. Vascular: Normal flow voids. Skull and upper cervical spine: Normal marrow signal. Sinuses/Orbits: Clear sinuses.  No acute findings. IMPRESSION: 1. 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 This could represent treatment related change or progression. Recommend attention on follow-up. 2. 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 3. Unchanged punctate lesion in the left cerebellum, most likely a vessel but warranting attention on follow-up. 4. 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 Recommend ENT follow-up if not previously evaluated. Electronically Signed   By: Gilmore GORMAN Molt M.D.   On: 04/11/2024 13:00   PERIPHERAL VASCULAR CATHETERIZATION Result Date: 03/25/2024 See surgical note for result.

## 2024-06-06 NOTE — Assessment & Plan Note (Signed)
Durvalumab treatment as planned.  

## 2024-06-06 NOTE — Assessment & Plan Note (Signed)
 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  Labs are reviewed and discussed with patient. Proceed with Durvalumab 

## 2024-06-06 NOTE — Assessment & Plan Note (Signed)
 Increase oral potassium 20meq BID for 1 week than back to 20meq daily, monitor

## 2024-06-06 NOTE — Assessment & Plan Note (Signed)
 continue levothyroxine 125 mcg daily

## 2024-06-06 NOTE — Assessment & Plan Note (Signed)
 Off bevacizumab  04/11/2024 MRI brain w wo contrast results were reviewed. He is asymptomatic.  Follow up  with [name].

## 2024-06-06 NOTE — Assessment & Plan Note (Signed)
 Follow up with [name].  Continue keppra 

## 2024-06-07 ENCOUNTER — Inpatient Hospital Stay

## 2024-06-07 NOTE — Progress Notes (Signed)
 Nutrition Follow-up:  Patient with stage IV lung SCC.  Receiving durvalumab .  Followed by Dr Babara.  Spoke with patient via phone for nutrition follow-up.  Reports that he is eating a little bit better.  Does not get nauseated like he was before.  Has been drinking the ensure clear and ensure max protein shakes.  Yesterday ate cheeseburger and fries from [employer].  Planning to eat another one today.  Has been eating zucchini bread  Medications: reviewed  Labs: reviewed  Anthropometrics:   Weight 188 lb 1.6 oz  187 lb 3.2 oz on 10/20 184 lb 9.6 oz on 9/23 189 lb 11.2 oz on 9/8 189 lb on 8/28 222 lb 3.2 oz on 5/14   NUTRITION DIAGNOSIS: Inadequate oral intake some improvement   INTERVENTION:  Recommend switch to ensure complete (350 calorie and 30g protein) shake once he is finished with ensure max protein (150 calories and 30 g protein)     MONITORING, EVALUATION, GOAL: weight trends, intake   NEXT VISIT: Tuesday, Jan 13 phone call  Joshua [name] B. Dasie SOLON, CSO, LDN Registered Dietitian [phone]

## 2024-06-08 ENCOUNTER — Other Ambulatory Visit: Payer: Self-pay

## 2024-06-20 ENCOUNTER — Other Ambulatory Visit (HOSPITAL_COMMUNITY): Payer: Self-pay

## 2024-06-20 ENCOUNTER — Encounter: Payer: Self-pay | Admitting: Oncology

## 2024-06-20 ENCOUNTER — Other Ambulatory Visit: Payer: Self-pay

## 2024-06-20 ENCOUNTER — Inpatient Hospital Stay

## 2024-06-20 ENCOUNTER — Inpatient Hospital Stay: Attending: Oncology

## 2024-06-20 ENCOUNTER — Other Ambulatory Visit: Payer: Self-pay | Admitting: Internal Medicine

## 2024-06-20 ENCOUNTER — Inpatient Hospital Stay: Attending: Oncology | Admitting: Oncology

## 2024-06-20 ENCOUNTER — Other Ambulatory Visit: Payer: Self-pay | Admitting: Oncology

## 2024-06-20 ENCOUNTER — Encounter (HOSPITAL_COMMUNITY): Payer: Self-pay

## 2024-06-20 VITALS — BP 148/88 | HR 99 | Temp 97.3°F | Resp 18 | Wt 187.0 lb

## 2024-06-20 VITALS — BP 114/63 | HR 73

## 2024-06-20 DIAGNOSIS — C3411 Malignant neoplasm of upper lobe, right bronchus or lung: Secondary | ICD-10-CM | POA: Diagnosis present

## 2024-06-20 DIAGNOSIS — C3491 Malignant neoplasm of unspecified part of right bronchus or lung: Secondary | ICD-10-CM

## 2024-06-20 DIAGNOSIS — E876 Hypokalemia: Secondary | ICD-10-CM | POA: Insufficient documentation

## 2024-06-20 DIAGNOSIS — E032 Hypothyroidism due to medicaments and other exogenous substances: Secondary | ICD-10-CM

## 2024-06-20 DIAGNOSIS — Z79899 Other long term (current) drug therapy: Secondary | ICD-10-CM | POA: Insufficient documentation

## 2024-06-20 DIAGNOSIS — Z5112 Encounter for antineoplastic immunotherapy: Secondary | ICD-10-CM

## 2024-06-20 DIAGNOSIS — R569 Unspecified convulsions: Secondary | ICD-10-CM | POA: Insufficient documentation

## 2024-06-20 DIAGNOSIS — C7931 Secondary malignant neoplasm of brain: Secondary | ICD-10-CM | POA: Insufficient documentation

## 2024-06-20 LAB — CBC WITH DIFFERENTIAL/PLATELET
Abs Immature Granulocytes: 0.02 K/uL (ref 0.00–0.07)
Basophils Absolute: 0.1 K/uL (ref 0.0–0.1)
Basophils Relative: 1 %
Eosinophils Absolute: 0.3 K/uL (ref 0.0–0.5)
Eosinophils Relative: 4 %
HCT: 37.6 % — ABNORMAL LOW (ref 39.0–52.0)
Hemoglobin: 11.7 g/dL — ABNORMAL LOW (ref 13.0–17.0)
Immature Granulocytes: 0 %
Lymphocytes Relative: 20 %
Lymphs Abs: 1.4 K/uL (ref 0.7–4.0)
MCH: 23.8 pg — ABNORMAL LOW (ref 26.0–34.0)
MCHC: 31.1 g/dL (ref 30.0–36.0)
MCV: 76.4 fL — ABNORMAL LOW (ref 80.0–100.0)
Monocytes Absolute: 0.6 K/uL (ref 0.1–1.0)
Monocytes Relative: 9 %
Neutro Abs: 4.6 K/uL (ref 1.7–7.7)
Neutrophils Relative %: 66 %
Platelets: 231 K/uL (ref 150–400)
RBC: 4.92 MIL/uL (ref 4.22–5.81)
RDW: 17.3 % — ABNORMAL HIGH (ref 11.5–15.5)
WBC: 7 K/uL (ref 4.0–10.5)
nRBC: 0 % (ref 0.0–0.2)

## 2024-06-20 LAB — COMPREHENSIVE METABOLIC PANEL WITH GFR
ALT: 16 U/L (ref 0–44)
AST: 26 U/L (ref 15–41)
Albumin: 3.9 g/dL (ref 3.5–5.0)
Alkaline Phosphatase: 74 U/L (ref 38–126)
Anion gap: 10 (ref 5–15)
BUN: 12 mg/dL (ref 8–23)
CO2: 24 mmol/L (ref 22–32)
Calcium: 8.8 mg/dL — ABNORMAL LOW (ref 8.9–10.3)
Chloride: 105 mmol/L (ref 98–111)
Creatinine, Ser: 1.03 mg/dL (ref 0.61–1.24)
GFR, Estimated: >60 mL/min (ref >60)
Glucose, Bld: 115 mg/dL — ABNORMAL HIGH (ref 70–99)
Potassium: 3.9 mmol/L (ref 3.5–5.1)
Sodium: 139 mmol/L (ref 135–145)
Total Bilirubin: 0.7 mg/dL (ref 0.0–1.2)
Total Protein: 6.9 g/dL (ref 6.5–8.1)

## 2024-06-20 LAB — TSH: TSH: 14.4 u[IU]/mL — ABNORMAL HIGH (ref 0.350–4.500)

## 2024-06-20 MED ORDER — SODIUM CHLORIDE 0.9 % IV SOLN
Freq: Once | INTRAVENOUS | Status: AC
  Filled 2024-06-20: qty 250

## 2024-06-20 MED ORDER — LEVETIRACETAM 1000 MG PO TABS
1000.0000 mg | ORAL_TABLET | Freq: Two times a day (BID) | ORAL | 2 refills | Status: DC
  Filled 2024-06-20: qty 60, 30d supply, fill #0
  Filled 2024-07-22: qty 60, 30d supply, fill #1

## 2024-06-20 MED ORDER — SODIUM CHLORIDE 0.9 % IV SOLN
10.0000 mg/kg | Freq: Once | INTRAVENOUS | Status: AC
  Administered 2024-06-20: 860 mg via INTRAVENOUS
  Filled 2024-06-20: qty 7.2

## 2024-06-20 NOTE — Assessment & Plan Note (Signed)
 Follow up with [name].  Continue keppra 

## 2024-06-20 NOTE — Progress Notes (Signed)
 Hematology/Oncology Progress note [phone] [phone]    CHIEF COMPLAINTS/REASON FOR VISIT:  Follow up for lung cancer  ASSESSMENT & PLAN:   Cancer Staging  Squamous cell carcinoma of right lung (HCC) Staging form: Lung, AJCC 8th Edition - Clinical stage from 09/20/2020: Stage IV (cT1b, cN2, cM1) - Signed by [name], MD on 12/13/2021   Squamous cell carcinoma of right lung (HCC) Durvalumab  was previously held due to pneumonitis.  Labs are reviewed and discussed with patient. Proceed with Durvalumab     Encounter for antineoplastic immunotherapy Durvalumab  treatment as planned.   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 He is asymptomatic.  Follow up  with [name].   Seizure (HCC) Follow up with [name].  Continue keppra      Follow up  2 weeks lab MD Durvalumab   All questions were answered. The patient knows to call the clinic with any problems, questions or concerns.  Call Babara, MD, PhD [facility] Hematology Oncology 06/20/2024       HISTORY OF PRESENTING ILLNESS:  Joshua [name] is a 68 y.o. male who has above history reviewed by me today presents for follow up visit for Stage IV lung squamous cell carcinoma. Oncology History  Squamous cell carcinoma of right lung (HCC)  09/18/2020 Imaging   PET scan showed right upper lobe nodule maximal SUV 11.5.  Hypermetabolic cluster right paratracheal adenopathy with maximum SUV up to 12.  No findings of metastatic disease to the neck/abdomen/pelvis or skeleton.   09/20/2020 Initial Diagnosis   Squamous cell carcinoma of right lung (HCC) NGS showed PD-L1 TPS 95%, LRP1B S997fs, PIK3CA E545K, RB1 c1422-2A>T, RIT1 T83_ A84del, STK11 P281fs, TPS53 R248W, TMB 19.64mut/mb, MS stable.     09/20/2020 Cancer Staging   Staging form: Lung, AJCC 8th Edition - Clinical stage from 09/20/2020: Stage IV (cT1b, cN2, cM1) - Signed by [name], MD on 12/13/2021   10/03/2020 Imaging   MRI brain with and without contrast showed no evidence of intracranial metastatic disease. Well-defined round lesion within the left parotid tail measuring approximately 2.3 x 1.8 x 1.8 cm   10/09/2020 - 11/27/2020 Radiation Therapy   concurrent chemoradiation   10/10/2020 - 10/31/2020 Chemotherapy   10/10/2020 concurrent chemoradiation.  carboplatin  AUC of 2 and Taxol  45 mg/m2 x 4.  Additional chemotherapy was held due to thrombocytopenia, neutropenia       12/31/2020 - 03/21/2022 Chemotherapy   Maintenance  Durvalumab  q14d      12/31/2020 -  Chemotherapy   Patient is on Treatment Plan : LUNG Durvalumab  (10) q14d     03/28/2021 Imaging   CT without contrast showed new linear right perihilar consolidation with associated traction bronchiectasis.  Likely postradiation changes.  Additional new peribronchial vascular/nodular groundglass opacities with areas of consolidation or seen primarily in the right upper and lower lobes, more distant from expected radiation fields. 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  Per my discussion with pulmonology Dr. 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     05/09/2021 Imaging   Repeat CT chest without contrast showed evolving radiation changes in the right perihilar region likely accounting for interval increase to thickening of the posterior wall of the right upper lobe bronchus.  The  more peripheral right lung groundglass opacities seen on most recent study have generally improved with exception of one peripherally right upper lobe which appears new.  These are likely inflammatory.  No findings highly suspicious for local recurrence.   05/30/2021  - 01/10/2022 Chemotherapy   Durvalumab  q14d     10/07/2021 Imaging   MRI brain with and without contrast showed 2 contrast-enhancing lesions, most consistent with metastatic disease.  Largest lesion is in the left frontal lobe and measures up to 1.3 cm with a large amount of surrounding edema and 9 mm rightward midline shift.  The other enhancing lesion was 4 mm in the right occipital lobe with a small amount of surrounding edema.  No acute or chronic hemorrhage.   10/07/2021 Progression   Headache after colonoscopy, progressive difficulty using her right arm.  Progressively worsening of speech. Stage IV lung cancer with brain metastasis.  started on Dexamethasone .  He was seen by neurosurgery and radiation oncology.  Recommendation is to proceed with SRS.  Patient was discharged with dexamethasone    10/16/2021 Imaging   PET scan showed postradiation changes in the right supra hilar lung without evidence of local recurrence.  No evidence of lung cancer recurrence or metastatic disease.  Single focus of intense metabolic activity associated with posterior left 11th rib likely secondary to recent fall/posttraumatic metabolic changes.  Frontal  uptake in the left frontal lobe corresponding to the known metastatic lesion in the brain   10/29/2021 -  Radiation Therapy   Brain Radiation [facility]   12/10/2021 Imaging   Brain MRI: Continued interval decrease in size of a left frontal lobe metastasis, now measuring 10 mm. Moderate surrounding edema has also decreased.  3-4 mm metastasis within the right occipital lobe, not significantly changed in size. There is now a small focus of central non enhancement within this lesion. Increased edema. 3 mm enhancing metastasis within the high left parietal lobe. In retrospect, this was likely present as a subtle punctate focus of enhancement on the prior brain MRI of 10/22/2021.   01/18/2022 Imaging   CT chest abdomen pelvis w contrast 1. Stable post treatment changes in the  right upper lobe medially with dense radiation fibrosis but no findings to suggest local recurrent tumor. 2. Significant progression of ill-defined ground-glass opacity, interstitial thickening and airspace nodularity in the right upper lobe and upper aspect of the right lower lobe. Findings could reflect an inflammatory or atypical infectious process, drug induced pneumonitis or interstitial spread of tumor.3. No mediastinal or hilar mass or adenopathy. 4. No findings for abdominal/pelvic metastatic disease or osseous metastatic disease.   02/19/2022 Imaging   MRI brain w wo contrast showed Three metastatic deposits in the brain appear smaller. Decreased edema in the left parietal lobe. No new lesions    04/04/2022 Imaging   CT chest w contrast 1. Stable radiation changes in the right perihilar region without evidence of local recurrence. 2. Interval improvement in the patchy ground-glass opacities previously demonstrated in both lungs, most consistent with a resolving infectious or inflammatory process (including immunotherapy-related pneumonitis). 3. No evidence of metastatic disease.   05/14/2022 Miscellaneous   He had a mechanical fall and fractured his left elbow. xrays showed a left elbow fracture  05/26/22 seen by orthopedic surgeon, cast applied.    05/27/2022 Imaging   MRI brain w wo contrast 1. All three known brain metastases have enlarged since August, with increasing regional edema. And there is a small new left anterior cerebellar 4-5 mm metastasis (was  questionably punctate on the May MRI). This constellation could reflect a combination of True Progression (Left Cerebellar lesion) and pseudoprogression/radionecrosis (3 existing mets).    2. No significant intracranial mass effect. 3. Underlying moderate cerebral white matter signal changes.   05/30/2022 Imaging   CT chest abdomen pelvis w contrast  1. Stable examination demonstrating chronic postradiation mass-like fibrosis in  the right lung with resolving postradiation pneumonitis in the adjacent lung parenchyma. There is a stable small right pleural effusion, but no definitive findings to suggest residual or recurrent disease on today's examination. 2. No signs of metastatic disease in the abdomen or pelvis.  3. Aortic atherosclerosis, in addition to two-vessel coronary artery disease. Please note that although the presence of coronary artery calcium  documents the presence of coronary artery disease, the severity of this disease and any potential stenosis cannot be assessed on this non-gated CT examination. Assessment for potential risk factor modification, dietary therapy or pharmacologic therapy may be warranted, if clinically indicated. 4. Hepatic steatosis. 5. Colonic diverticulosis without evidence of acute diverticulitis at this time. 6. Incompletely imaged but apparently occluded vascular graft in the superior aspect of the right anterior thigh. Referral to vascular specialist for further clinical evaluation should be considered if clinically appropriate. 7. Additional incidental findings, as above.   07/22/2022 Imaging   MRI brain  1. No new or enlarging lesions. No intracranial disease progression. 2. Decreased size of treated lesions of the left frontoparietal junction and left postcentral gyrus. 3. Unchanged lesions in the left cerebellum and right occipital lobe.   10/15/2022 Imaging   MRI brain w wo contrast showed Interval increase in size of multiple intraparenchymal lesions with worsened surrounding vasogenic edema. 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 No new lesions   11/05/2022 Imaging   MRI brain w wo contrast  1. Likely slight increase and right occipital and left cerebellar lesions. Otherwise, unchanged metastases, as above. No new lesions identified. 2. Partially imaged large probable lymph node in the left upper  neck, apparent on prior PET CT.   11/13/2022 -  Radiation Therapy   Each of the sites were treated with SRS/SBRT/SRT-IMRT technique to 20 Gy in 1 fraction to each of the following sites: PTV_2_LCerebel_60mm PTV_3_RCerebel_57mm PTV_4_ROccip_77mm PTV_5_LParietal_28mm   11/27/2022 Imaging   CT chest abdomen pelvis w contrast showed  Improving bilateral scattered interstitial and ground-glass opacities. Small amount of residual areas in the extreme left lower lobe inferiorly. This specific area is slightly increased from previous but overall the amount of opacities is significantly improved. Recommend continued follow-up. No developing new mass lesion, fluid collection or lymph node enlargement. Stable posttreatment changes right perihilar in the lung. Fatty liver infiltration. Colonic diverticulosis. Improved tiny right pleural effusion   02/05/2023 Imaging   MRI brain with and without contrast Interval decrease in all of the metastatic lesions, detailed above.No new lesions identified.    Imaging   CT chest abdomen pelvis w contrast showed  1. 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 2. Trace right pleural fluid or thickening, new or minimally increased. 3. Incidental findings, including: Aortic atherosclerosis (ICD10-I70.0), coronary artery atherosclerosis and emphysema (ICD10-J43.9). Hepatic steatosis.   06/25/2023 Imaging   CT chest abdomen pelvis w contrast showed 1. Unchanged post treatment/post radiation appearance of the right lung, with perihilar and suprahilar paramedian fibrosis and consolidation. 2. No evidence of lymphadenopathy or metastatic disease in the chest, abdomen, or pelvis. 3.  Hepatic steatosis. 4. Coronary artery disease.   Aortic Atherosclerosis (ICD10-I70.0) and Emphysema (ICD10-J43.9).   08/26/2023 [facility] Admission   hospitalization due to was observed speech and jerking movements on right  side despite being on dexamethasone . Continuous EEG suggest cortical dysfunction arising from left temporal region.  No seizure or elective form discharges were seen. Patient was discharged on dexamethasone  4 mg, Keppra , lamotrigine .    11/04/2023 -  Chemotherapy   Add Bevacizumab  5mg /kg to Durvalumab .     11/17/2023 - 11/27/2023 Radiation Therapy   Patient received radiation to brain for treatment of recurrence.   02/23/2024 Imaging   CT chest abdomen pelvis w contrast showed  CHEST:   1. Post RIGHT upper lobe resection. 2. No evidence of lung cancer recurrence. 3. No mediastinal adenopathy.   PELVIS:   1. No evidence of metastatic disease in the abdomen pelvis. 2. No skeletal metastasis.     INTERVAL HISTORY Joshua [name] is a 68 y.o. male who has above history reviewed by me today presents for follow up visit for right lung RCC.  chronic SOB,not worse.  Peripheral artery disease,Patient is on aspirin  and Plavix . No new complaints   Review of Systems  Constitutional:  Positive for fatigue. Negative for appetite change, chills, fever and unexpected weight change.  HENT:   Negative for hearing loss and voice change.   Eyes:  Negative for eye problems and icterus.  Respiratory:  Negative for chest tightness, cough and shortness of breath.   Cardiovascular:  Positive for leg swelling. Negative for chest pain.  Gastrointestinal:  Negative for abdominal distention, abdominal pain, nausea and vomiting.  Endocrine: Negative for hot flashes.  Genitourinary:  Negative for difficulty urinating, dysuria and frequency.   Musculoskeletal:  Positive for gait problem. Negative for arthralgias.  Skin:  Negative for itching and rash.  Neurological:  Positive for gait problem. Negative for extremity weakness, headaches, numbness and speech difficulty.  Hematological:  Negative for adenopathy. Bruises/bleeds easily.  Psychiatric/Behavioral:  Negative for confusion.       MEDICAL  HISTORY:  Past Medical History:  Diagnosis Date   Allergy    Cancer (HCC)    lung   COPD (chronic obstructive pulmonary disease) (HCC)    Dyspnea    former smoker. 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   Malignant neoplasm metastatic to brain [facility])    Nausea with vomiting 02/01/2024   Peripheral vascular disease    Pre-diabetes    Sleep apnea     SURGICAL HISTORY: Past Surgical History:  Procedure Laterality Date   ANTERIOR CERVICAL DECOMP/DISCECTOMY FUSION N/A 03/14/2020   Procedure: ANTERIOR CERVICAL DECOMPRESSION/DISCECTOMY FUSION 3 LEVELS C4-7;  Surgeon: Clois Fret, MD;  Location: [facility] ORS;  Service: Neurosurgery;  Laterality: N/A;   BACK SURGERY  2011   CENTRAL LINE INSERTION Right 09/10/2016   Procedure: CENTRAL LINE INSERTION;  Surgeon: Jama Cordella MATSU, MD;  Location: [facility] ORS;  Service: Vascular;  Laterality: Right;   CERVICAL FUSION     C4-c7 on Mar 14, 2020   COLONOSCOPY  2013 ?   COLONOSCOPY N/A 09/09/2021   Procedure: COLONOSCOPY;  Surgeon: Onita Elspeth Sharper, DO;  Location: [facility] ENDOSCOPY;  Service: Gastroenterology;  Laterality: N/A;   CORONARY ANGIOPLASTY     ESOPHAGOGASTRODUODENOSCOPY N/A 09/09/2021   Procedure: ESOPHAGOGASTRODUODENOSCOPY (EGD);  Surgeon: Onita Elspeth Sharper, DO;  Location: [facility] ENDOSCOPY;  Service: Gastroenterology;  Laterality: N/A;   FEMORAL-POPLITEAL BYPASS  GRAFT Right 09/10/2016   Procedure: BYPASS GRAFT FEMORAL-POPLITEAL ARTERY ( BELOW KNEE );  Surgeon: Jama Cordella MATSU, MD;  Location: [facility] ORS;  Service: Vascular;  Laterality: Right;   FEMORAL-TIBIAL BYPASS GRAFT Right 03/23/2020   Procedure: BYPASS GRAFT RIGHT FEMORAL- DISTAL TIBIAL ARTERY WITH DISTAL FLOW GRAFT;  Surgeon: Jama Cordella MATSU, MD;  Location: [facility] ORS;  Service: Vascular;  Laterality: Right;   LEFT HEART CATH AND CORONARY ANGIOGRAPHY Left 05/16/2021   Procedure: LEFT HEART CATH AND CORONARY  ANGIOGRAPHY;  Surgeon: Lawyer Bernardino Cough, MD;  Location: [facility] INVASIVE CV LAB;  Service: Cardiovascular;  Laterality: Left;   LOWER EXTREMITY ANGIOGRAPHY Right 11/18/2016   Procedure: Lower Extremity Angiography;  Surgeon: Cordella MATSU Jama, MD;  Location: [facility] INVASIVE CV LAB;  Service: Cardiovascular;  Laterality: Right;   LOWER EXTREMITY ANGIOGRAPHY Right 12/09/2016   Procedure: Lower Extremity Angiography;  Surgeon: Jama Cordella MATSU, MD;  Location: [facility] INVASIVE CV LAB;  Service: Cardiovascular;  Laterality: Right;   LOWER EXTREMITY ANGIOGRAPHY Right 11/15/2019   Procedure: LOWER EXTREMITY ANGIOGRAPHY;  Surgeon: Jama Cordella MATSU, MD;  Location: [facility] INVASIVE CV LAB;  Service: Cardiovascular;  Laterality: Right;   LOWER EXTREMITY ANGIOGRAPHY Right 12/01/2019   Procedure: Lower Extremity Angiography;  Surgeon: Marea Selinda RAMAN, MD;  Location: [facility] INVASIVE CV LAB;  Service: Cardiovascular;  Laterality: Right;   LOWER EXTREMITY ANGIOGRAPHY Right 12/02/2019   Procedure: LOWER EXTREMITY ANGIOGRAPHY;  Surgeon: Jama Cordella MATSU, MD;  Location: [facility] INVASIVE CV LAB;  Service: Cardiovascular;  Laterality: Right;   LOWER EXTREMITY ANGIOGRAPHY Right 03/23/2020   Procedure: Lower Extremity Angiography;  Surgeon: Jama Cordella MATSU, MD;  Location: [facility] INVASIVE CV LAB;  Service: Cardiovascular;  Laterality: Right;   LOWER EXTREMITY ANGIOGRAPHY Right 09/25/2021   Procedure: Lower Extremity Angiography;  Surgeon: Jama Cordella MATSU, MD;  Location: [facility] INVASIVE CV LAB;  Service: Cardiovascular;  Laterality: Right;   LOWER EXTREMITY ANGIOGRAPHY Right 09/26/2021   Procedure: Lower Extremity Angiography;  Surgeon: Marea Selinda RAMAN, MD;  Location: [facility] INVASIVE CV LAB;  Service: Cardiovascular;  Laterality: Right;   LOWER EXTREMITY ANGIOGRAPHY Right 04/08/2022   Procedure: Lower Extremity Angiography;  Surgeon: Jama Cordella MATSU, MD;  Location: [facility] INVASIVE CV LAB;  Service: Cardiovascular;  Laterality: Right;   LOWER  EXTREMITY ANGIOGRAPHY Right 03/04/2024   Procedure: Lower Extremity Angiography;  Surgeon: Jama Cordella MATSU, MD;  Location: [facility] INVASIVE CV LAB;  Service: Cardiovascular;  Laterality: Right;   LOWER EXTREMITY ANGIOGRAPHY Right 03/25/2024   Procedure: Lower Extremity Angiography;  Surgeon: Jama Cordella MATSU, MD;  Location: [facility] INVASIVE CV LAB;  Service: Cardiovascular;  Laterality: Right;   LOWER EXTREMITY INTERVENTION  11/18/2016   Procedure: Lower Extremity Intervention;  Surgeon: Cordella MATSU Jama, MD;  Location: [facility] INVASIVE CV LAB;  Service: Cardiovascular;;   LOWER EXTREMITY INTERVENTION Right 03/04/2024   Procedure: LOWER EXTREMITY INTERVENTION;  Surgeon: Jama Cordella MATSU, MD;  Location: [facility] INVASIVE CV LAB;  Service: Cardiovascular;  Laterality: Right;   PERIPHERAL VASCULAR CATHETERIZATION Right 01/02/2015   Procedure: Lower Extremity Angiography;  Surgeon: Cordella MATSU Jama, MD;  Location: [facility] INVASIVE CV LAB;  Service: Cardiovascular;  Laterality: Right;   PERIPHERAL VASCULAR CATHETERIZATION Right 06/18/2015   Procedure: Lower Extremity Angiography;  Surgeon: Selinda RAMAN Marea, MD;  Location: [facility] INVASIVE CV LAB;  Service: Cardiovascular;  Laterality: Right;   PERIPHERAL VASCULAR CATHETERIZATION  06/18/2015   Procedure: Lower Extremity Intervention;  Surgeon: Selinda RAMAN Marea, MD;  Location: [facility] INVASIVE CV LAB;  Service: Cardiovascular;;   PERIPHERAL VASCULAR  CATHETERIZATION N/A 10/09/2015   Procedure: Abdominal Aortogram w/Lower Extremity;  Surgeon: Cordella KANDICE Shawl, MD;  Location: [facility] INVASIVE CV LAB;  Service: Cardiovascular;  Laterality: N/A;   PERIPHERAL VASCULAR CATHETERIZATION  10/09/2015   Procedure: Lower Extremity Intervention;  Surgeon: Cordella KANDICE Shawl, MD;  Location: [facility] INV[facility] CV LAB;  Service: Cardiovascular;;   PERIPHERAL VASCULAR CATHETERIZATION Right 10/10/2015   Procedure: Lower Extremity Angiography;  Surgeon: Selinda GORMAN Gu, MD;  Location: [facility] INV[facility]IVE CV LAB;  Service:  Cardiovascular;  Laterality: Right;   PERIPHERAL VASCULAR CATHETERIZATION Right 10/10/2015   Procedure: Lower Extremity Intervention;  Surgeon: Selinda GORMAN Gu, MD;  Location: [facility] INVAS[facility] LAB;  Service: Cardiovascular;  Laterality: Right;   PERIPHERAL VASCULAR CATHETERIZATION Right 12/03/2015   Procedure: Lower Extremity Angiography;  Surgeon: Selinda GORMAN Gu, MD;  Location: [facility] INV[facility] LAB;  Service: Cardiovascular;  Laterality: Right;   PERIPHERAL VASCULAR CATHETERIZATION Right 12/04/2015   Procedure: Lower Extremity Angiography;  Surgeon: Selinda GORMAN Gu, MD;  Location: [facility] INV[facility] LAB;  Service: Cardiovascular;  Laterality: Right;   PERIPHERAL VASCULAR CATHETERIZATION  12/04/2015   Procedure: Lower Extremity Intervention;  Surgeon: Selinda GORMAN Gu, MD;  Location: [facility] IN[facility] LAB;  Service: Cardiovascular;;   PERIPHERAL VASCULAR CATHETERIZATION Right 06/30/2016   Procedure: Lower Extremity Angiography;  Surgeon: Selinda GORMAN Gu, MD;  Location: [facility] INV[facility] CV LAB;  Service: Cardiovascular;  Laterality: Right;   PERIPHERAL VASCULAR CATHETERIZATION Right 06/25/2016   Procedure: Lower Extremity Angiography;  Surgeon: Cordella KANDICE Shawl, MD;  Location: [facility] IN[facility] LAB;  Service: Cardiovascular;  Laterality: Right;   PERIPHERAL VASCULAR CATHETERIZATION Right 07/01/2016   Procedure: Lower Extremity Angiography;  Surgeon: Cordella KANDICE Shawl, MD;  Location: [facility] INV[facility];  Service: Cardiovascular;  Laterality: Right;   PERIPHERAL VASCULAR CATHETERIZATION  07/01/2016   Procedure: Lower Extremity Intervention;  Surgeon: Cordella KANDICE Shawl, MD;  Location: [facility] IN[facility] LAB;  Service: Cardiovascular;;   PERIPHERAL VASCULAR CATHETERIZATION Right 08/01/2016   Procedure: Lower Extremity Angiography;  Surgeon: Cordella KANDICE Shawl, MD;  Location: [facility] INV[facility] CV LAB;  Service: Cardiovascular;  Laterality: Right;   PORTA CATH INSERTION N/A 10/05/2020   Procedure: PORTA CATH INSERTION;  Surgeon: Shawl Cordella KANDICE, MD;  Location: [facility] INVASIVE CV LAB;  Service: Cardiovascular;  Laterality: N/A;   stent placement in right leg Right    VIDEO BRONCHOSCOPY N/A 09/14/2020   Procedure: VIDEO BRONCHOSCOPY WITH FLUORO;  Surgeon: Tamea Dedra CROME, MD;  Location: [facility] ORS;  Service: Cardiopulmonary;  Laterality: N/A;   VIDEO BRONCHOSCOPY WITH ENDOBRONCHIAL ULTRASOUND N/A 09/14/2020   Procedure: VIDEO BRONCHOSCOPY WITH ENDOBRONCHIAL ULTRASOUND;  Surgeon: Tamea Dedra CROME, MD;  Location: [facility] ORS;  Service: Cardiopulmonary;  Laterality: N/A;    SOCIAL HISTORY: Social History   Socioeconomic History   Marital status: Single    Spouse name: Not on file   Number of children: Not on file   Years of education: Not on file   Highest education level: Not on file  Occupational History   Occupation: unemployed  Tobacco Use   Smoking status: Former    Current packs/day: 0.00    Average packs/day: 2.0 packs/day for 38.0 years (76.0 ttl pk-yrs)    Types: Cigarettes    Start date: 12/19/1976    Quit date: 12/20/2014    Years since quitting: 9.5   Smokeless tobacco: Former   Tobacco comments:    quit smoking in 2017 most smoked 2   Vaping Use   Vaping status: Never Used  Substance and Sexual Activity   Alcohol  use: Not Currently   Drug use: No   Sexual activity: Yes  Other Topics Concern   Not on file  Social History Narrative   Not on file   Social Drivers of Health   Financial Resource Strain: Medium Risk (02/05/2024)   Overall Financial Resource Strain (CARDIA)    Difficulty of Paying Living Expenses: Somewhat hard  Food Insecurity: No Food Insecurity (03/25/2024)   Hunger Vital Sign    Worried About Running Out of Food in the Last Year: Never true    Ran Out of Food in the Last Year: Never true  Transportation Needs: No Transportation Needs (03/25/2024)   PRAPARE - Administrator, Civil Service (Medical): No    Lack of Transportation (Non-Medical): No  Physical Activity: Inactive  (02/05/2024)   Exercise Vital Sign    Days of Exercise per Week: 1 day    Minutes of Exercise per Session: 0 min  Stress: No Stress Concern Present (02/05/2024)   [employer] of Occupational Health - Occupational Stress Questionnaire    Feeling of Stress: Only a little  Social Connections: Socially Isolated (03/25/2024)   Social Connection and Isolation Panel    Frequency of Communication with Friends and Family: Three times a week    Frequency of Social Gatherings with Friends and Family: Once a week    Attends Religious Services: Never    Database Administrator or Organizations: No    Attends Banker Meetings: Never    Marital Status: Never married  Intimate Partner Violence: Not At Risk (03/25/2024)   Humiliation, Afraid, Rape, and Kick questionnaire    Fear of Current or Ex-Partner: No    Emotionally Abused: No    Physically Abused: No    Sexually Abused: No    FAMILY HISTORY: Family History  Problem Relation Age of Onset   Diabetes Mother    Hypertension Mother    Heart murmur Mother    Leukemia Mother    Throat cancer Maternal Grandmother    Colon cancer Neg Hx    Breast cancer Neg Hx     ALLERGIES:  has no known allergies.  MEDICATIONS:  Current Outpatient Medications  Medication Sig Dispense Refill   aspirin  EC 81 MG tablet Take 81 mg by mouth daily. Swallow whole.     clopidogrel  (PLAVIX ) 75 MG tablet Take 1 tablet (75 mg total) by mouth daily. 100 tablet 2   levothyroxine  (SYNTHROID ) 125 MCG tablet Take 1 tablet (125 mcg total) by mouth daily before breakfast. 90 tablet 0   potassium chloride  SA (KLOR-CON  M) 20 MEQ tablet Take 1 tablet (20 mEq total) by mouth daily. 30 tablet 2   simvastatin  (ZOCOR ) 40 MG tablet Take 1 tablet (40 mg total) by mouth daily. 30 tablet 0   levETIRAcetam  (KEPPRA ) 1000 MG tablet Take 1 tablet (1,000 mg total) by mouth 2 (two) times daily. 60 tablet 2   No current facility-administered medications for this visit.    Facility-Administered Medications Ordered in Other Visits  Medication Dose Route Frequency Provider Last Rate Last Admin   albuterol  (PROVENTIL ) (2.5 MG/3ML) 0.083% nebulizer solution 2.5 mg  2.5 mg Nebulization Once Tamea Dedra CROME, MD       heparin  lock flush 100 unit/mL  500 Units Intracatheter Once PRN [name], MD         PHYSICAL EXAMINATION: ECOG PERFORMANCE STATUS: 1 - Symptomatic but completely ambulatory Vitals:   06/20/24 1110 06/20/24  1120  BP: (!) 167/84 (!) 148/88  Pulse: 99   Resp: 18   Temp: (!) 97.3 F (36.3 C)   SpO2: 100%     Filed Weights   06/20/24 1110  Weight: 187 lb (84.8 kg)     Physical Exam Constitutional:      General: He is not in acute distress.    Appearance: He is obese.  HENT:     Head: Normocephalic and atraumatic.  Eyes:     General: No scleral icterus. Cardiovascular:     Rate and Rhythm: Normal rate and regular rhythm.  Pulmonary:     Effort: Pulmonary effort is normal. No respiratory distress.     Breath sounds: Normal breath sounds. No wheezing.  Abdominal:     General: There is no distension.     Palpations: Abdomen is soft.     Tenderness: There is no abdominal tenderness.  Musculoskeletal:     Cervical back: Normal range of motion and neck supple.     Comments: Right lower extremity wrapped.   Skin:    General: Skin is warm and dry.     Findings: Bruising present. No erythema.  Neurological:     Mental Status: He is alert and oriented to person, place, and time. Mental status is at baseline.     Cranial Nerves: No cranial nerve deficit.     Motor: No abnormal muscle tone.  Psychiatric:        Mood and Affect: Mood and affect normal.         LABORATORY DATA:  I have reviewed the data as listed    Latest Ref Rng & Units 06/20/2024   10:57 AM 06/06/2024    9:02 AM 05/23/2024    8:30 AM  CBC  WBC 4.0 - 10.5 K/uL 7.0  5.5  5.7   Hemoglobin 13.0 - 17.0 g/dL 88.2  88.7  88.7   Hematocrit 39.0 - 52.0 % 37.6   36.4  35.9   Platelets 150 - 400 K/uL 231  225  248       Latest Ref Rng & Units 06/20/2024   10:57 AM 06/06/2024    9:02 AM 05/23/2024    8:30 AM  CMP  Glucose 70 - 99 mg/dL 884  837  874   BUN 8 - 23 mg/dL 12  10  15    Creatinine 0.61 - 1.24 mg/dL 8.96  9.13  9.08   Sodium 135 - 145 mmol/L 139  136  139   Potassium 3.5 - 5.1 mmol/L 3.9  3.2  3.6   Chloride 98 - 111 mmol/L 105  104  106   CO2 22 - 32 mmol/L 24  22  22    Calcium  8.9 - 10.3 mg/dL 8.8  8.6  9.0   Total Protein 6.5 - 8.1 g/dL 6.9  6.9  7.2   Total Bilirubin 0.0 - 1.2 mg/dL 0.7  0.4  0.8   Alkaline Phos 38 - 126 U/L 74  67  61   AST 15 - 41 U/L 26  26  24    ALT 0 - 44 U/L 16  14  14       RADIOGRAPHIC STUDIES: I have personally reviewed the radiological images as listed and agreed with the findings in the report. VAS US  LOWER EXTREMITY ARTERIAL DUPLEX Result Date: 04/22/2024 LOWER EXTREMITY ARTERIAL DUPLEX STUDY Patient Name:  Joshua [name]  Date of Exam:   04/21/2024 Medical Rec #: [id number]  Accession #:    [id number] Date of Birth: 1956-05-01           Patient Gender: M Patient Age:   35 years Exam Location:  [facility] Procedure:      VAS US  LOWER EXTREMITY ARTERIAL DUPLEX Referring Phys: [facility] --------------------------------------------------------------------------------  Indications: Ulceration, peripheral artery disease, and Routine f/u. High Risk Factors: Hyperlipidemia. Other Factors: ASO s/p multiple interventions and bypass.                11/15/2019 Right thrombectomy of Fempop insitu graft with added                stent throughout.                 12/01/2019: Aortogram and Selctive Right Lower Extremity                Angiogram. 6 mg of TPA delivered in a lysis catheterfrom the                Origin of the Right Femoral -Popliteal bypass down the proximal                ATA with placement of the lysis.                 12/02/2019: PTA of the Right Posterior Tibial Artery. PTA  of the                Right Peroneal Artery. Mechanical Thrombectomy of the Right                Femoral -Popliteal Bypass graft. Mechanical Thrombectomy of the                Right Tibioperoneal Trunk and Posterior Tibial Artery.                 09/25/2021: Right Lower Extremity Angiography third order                catheter placement. PTA of the proximal peroneal and                tibioperoneal trunk to 2.5 mm with an ultra score balloon. PTA of                the Femoral Artery and the proximal anstomosis of the bypass                graft to 4 mm with an ultra score balloon. Administration of 5 mg                of tpa throughout the entire bypass and in the peroneal Artery.                Initiation of continuous tpa infusion Right lower Extremity for                limb salvage. Insertion of a triple lumen catheter Left CFV with                US  and fluoroscopic guidance.                 09/26/2021: Right LE Angiogram. PTA of the Right Posterior Tibial                Artery with 2.5 mm diameter by 30 cm length angioplasty balloon.                PTA of the distal bypass anastomosis  and distal bypass graft                analogous to the Popliteal artery with 4 mm diameter by 8 cm                length Lutonix drug coated angioplasty balloon.                04/08/2022 right thrombectomy of the Deep profunda.  Vascular Interventions: 03/25/2024 Right lower extremity angiography                         3. Atherectomy right common femoral artery,                         femoral-popliteal bypass and distal popliteal arteries                         with the Rota Rex catheter                         4. Ultrasound-guided access to the left common femoral                         artery                         5. Mechanical thrombectomy using the penumbra lightening                         bolt 7 catheter                         6. Percutaneous kissing stents in the profunda femoris                         and origin  of the femoral-popliteal bypass with a 9 x 40                         life star stent in the profunda and a 7 x 100 life stent                         in the femoral-popliteal bypass                         7. Percutaneous angioplasty and stent placement distal                         bypass and popliteal artery with a 6 mm x 40 mm life                         stent.                         8. Percutaneous transluminal angioplasty and stent                         placement right tibioperoneal trunk and posterior tibial                         artery using 3 Esprit  stents postdilated to 4 mm                         proximally and 3 mm distally.                          Hx of multiple right lower extremity PTA's/stents.                         09/10/2016 Right femoral to below the knee popliteal                         artery bypass graft placement. 5/01 Right bypass graft &                         mid popliteal artery PTA & coil embolectomy of large                         branch of bypass graft. 12/09/16 Right distal bypass                         graft PTA/stent and right posterior tibial artery PTA.                         03/23/2020 Rt PTA thrombectomy. Rt Profunda fem artery to                         PTA bypass. Current ABI:            rt = 1.13 Performing Technologist: Jerel Croak RVT  Examination Guidelines: A complete evaluation includes B-mode imaging, spectral Doppler, color Doppler, and power Doppler as needed of all accessible portions of each vessel. Bilateral testing is considered an integral part of a complete examination. Limited examinations for reoccurring indications may be performed as noted.  +-----------+--------+-----+--------+----------+--------+ RIGHT      PSV cm/sRatioStenosisWaveform  Comments +-----------+--------+-----+--------+----------+--------+ CFA Mid    54                   biphasic           +-----------+--------+-----+--------+----------+--------+ DFA        189                   triphasic          +-----------+--------+-----+--------+----------+--------+ SFA Prox                                  BPG      +-----------+--------+-----+--------+----------+--------+ SFA Mid                                   BPG      +-----------+--------+-----+--------+----------+--------+ SFA Distal                                BPG      +-----------+--------+-----+--------+----------+--------+ POP Distal 38                   biphasic           +-----------+--------+-----+--------+----------+--------+ ATA  Distal 19                   monophasic         +-----------+--------+-----+--------+----------+--------+ PTA Distal 48                   triphasic          +-----------+--------+-----+--------+----------+--------+ PERO Distal19                   monophasic         +-----------+--------+-----+--------+----------+--------+  Right Graft #1: FemPop +------------------+--------+--------+---------+--------+                   PSV cm/sStenosisWaveform Comments +------------------+--------+--------+---------+--------+ Inflow            54              biphasic          +------------------+--------+--------+---------+--------+ Prox Anastomosis  52              biphasic          +------------------+--------+--------+---------+--------+ Proximal Graft    64              biphasic          +------------------+--------+--------+---------+--------+ Mid Graft         63              triphasic         +------------------+--------+--------+---------+--------+ Distal Graft      74              biphasic          +------------------+--------+--------+---------+--------+ Distal Anastomosis58              triphasic         +------------------+--------+--------+---------+--------+ Outflow           38              biphasic          +------------------+--------+--------+---------+--------+   Summary: Right: Patent FemPop  BPG and new stents throughout. Strong triphasic flow to distal PTA. NL abi  See table(s) above for measurements and observations. Electronically signed by [name] MD on 04/22/2024 at 9:35:02 AM.    Final    VAS US  ABI WITH/WO TBI Result Date: 04/22/2024  LOWER EXTREMITY DOPPLER STUDY Patient Name:  Joshua [name]  Date of Exam:   04/21/2024 Medical Rec #: [id number]            Accession #:    [id number] Date of Birth: 08-Oct-1955           Patient Gender: M Patient Age:   66 years Exam Location:  [facility] Procedure:      VAS US  ABI WITH/WO TBI Referring Phys: [facility] --------------------------------------------------------------------------------  Indications: Ulceration, peripheral artery disease, and Routine f/u. High Risk Factors: Hyperlipidemia. Other Factors: ASO s/p multiple interventions and bypass.                11/15/2019 Right thrombectomy of Fempop insitu graft with added                stent throughout.                 12/01/2019: Aortogram and Selctive Right Lower Extremity                Angiogram. 6 mg of TPA delivered in a lysis catheterfrom the  Origin of the Right Femoral -Popliteal bypass down the proximal                ATA with placement of the lysis.                 12/02/2019: PTA of the Right Posterior Tibial Artery. PTA of the                Right Peroneal Artery. Mechanical Thrombectomy of the Right                Femoral -Popliteal Bypass graft. Mechanical Thrombectomy of the                Right Tibioperoneal Trunk and Posterior Tibial Artery.                 09/25/2021: Right Lower Extremity Angiography third order                catheter placement. PTA of the proximal peroneal and                tibioperoneal trunk to 2.5 mm with an ultra score balloon. PTA of                the Femoral Artery and the proximal anstomosis of the bypass                graft to 4 mm with an ultra score balloon. Administration of 5 mg                of tpa  throughout the entire bypass and in the peroneal Artery.                Initiation of continuous tpa infusion Right lower Extremity for                limb salvage. Insertion of a triple lumen catheter Left CFV with                US  and fluoroscopic guidance.                 09/26/2021: Right LE Angiogram. PTA of the Right Posterior Tibial                Artery with 2.5 mm diameter by 30 cm length angioplasty balloon.                PTA of the distal bypass anastomosis and distal bypass graft                analogous to the Popliteal artery with 4 mm diameter by 8 cm                length Lutonix drug coated angioplasty balloon.                04/08/2022 right thrombectomy of the Deep profunda.  Vascular Interventions: 03/25/2024 Right lower extremity angiography                         3. Atherectomy right common femoral artery,                         femoral-popliteal bypass and distal popliteal arteries                         with the Rota Rex catheter  4. Ultrasound-guided access to the left common femoral                         artery                         5. Mechanical thrombectomy using the penumbra lightening                         bolt 7 catheter                         6. Percutaneous kissing stents in the profunda femoris                         and origin of the femoral-popliteal bypass with a 9 x 40                         life star stent in the profunda and a 7 x 100 life stent                         in the femoral-popliteal bypass                         7. Percutaneous angioplasty and stent placement distal                         bypass and popliteal artery with a 6 mm x 40 mm life                         stent.                         8. Percutaneous transluminal angioplasty and stent                         placement right tibioperoneal trunk and posterior tibial                         artery using 3 Esprit stents postdilated to 4 mm                         proximally  and 3 mm distally.                          Hx of multiple right lower extremity PTA's/stents.                         09/10/2016 Right femoral to below the knee popliteal                         artery bypass graft placement. 5/01 Right bypass graft &                         mid popliteal artery PTA & coil embolectomy of large                         branch of bypass graft. 12/09/16 Right distal  bypass                         graft PTA/stent and right posterior tibial artery PTA.                         03/23/2020 Rt PTA thrombectomy. Rt Profunda fem artery to                         PTA bypass. Performing Technologist: Jerel Croak RVT  Examination Guidelines: A complete evaluation includes at minimum, Doppler waveform signals and systolic blood pressure reading at the level of bilateral brachial, anterior tibial, and posterior tibial arteries, when vessel segments are accessible. Bilateral testing is considered an integral part of a complete examination. Photoelectric Plethysmograph (PPG) waveforms and toe systolic pressure readings are included as required and additional duplex testing as needed. Limited examinations for reoccurring indications may be performed as noted.  ABI Findings: +---------+------------------+-----+--------+--------+ Right    Rt Pressure (mmHg)IndexWaveformComment  +---------+------------------+-----+--------+--------+ Brachial 128                                     +---------+------------------+-----+--------+--------+ PTA      140               1.08 biphasic         +---------+------------------+-----+--------+--------+ DP       147               1.13 biphasic         +---------+------------------+-----+--------+--------+ Great Toe145               1.12 Normal           +---------+------------------+-----+--------+--------+ +---------+------------------+-----+---------+-------+ Left     Lt Pressure (mmHg)IndexWaveform Comment  +---------+------------------+-----+---------+-------+ Brachial 130                                     +---------+------------------+-----+---------+-------+ PTA      145               1.12 triphasic        +---------+------------------+-----+---------+-------+ DP       144               1.11 triphasic        +---------+------------------+-----+---------+-------+ Great Toe152               1.17 Normal           +---------+------------------+-----+---------+-------+ +-------+-----------+-----------+------------+------------+ ABI/TBIToday's ABIToday's TBIPrevious ABIPrevious TBI +-------+-----------+-----------+------------+------------+ Right  1.13       1.12       .37         .26          +-------+-----------+-----------+------------+------------+ Left   1.12       1.17       1.13        .75          +-------+-----------+-----------+------------+------------+  Right TBIs and ABIs appear increased compared to prior study on 02/2024.  Summary: Right: Resting right ankle-brachial index is within normal range. The right toe-brachial index is normal.  Left: Resting left ankle-brachial index is within normal range. The left toe-brachial index is normal.  *See table(s) above for measurements and observations.  Electronically signed by [name] MD  on 04/22/2024 at 9:34:54 AM.    Final    MR BRAIN W WO CONTRAST Result Date: 04/11/2024 CLINICAL DATA:  Brain/CNS neoplasm, assess treatment response EXAM: MRI HEAD WITHOUT AND WITH CONTRAST TECHNIQUE: Multiplanar, multiecho pulse sequences of the brain and surrounding structures were obtained without and with intravenous contrast. CONTRAST:  9 mL of Vueway  COMPARISON:  MRI head January 04, 2024. FINDINGS: Brain: Slight increase in size of a centrally necrotic 9 mm lesion in the right occipital lobe. Similar versus slightly decreased necrotic left parietal lesion which measures approximately 2.1 x 1.9 cm. Similar versus slightly  improved/decreased 1.1 x 1.1 cm lesion in the left frontal operculum. Unchanged punctate focus of enhancement in the left cerebellum (series 14, image 42). Edema surrounding these lesions is not substantially changed. No progressive mass effect. No midline shift. No evidence of acute infarct, acute hemorrhage, or hydrocephalus. Vascular: Normal flow voids. Skull and upper cervical spine: Normal marrow signal. Sinuses/Orbits: Clear sinuses.  No acute findings. IMPRESSION: 1. 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 This could represent treatment related change or progression. Recommend attention on follow-up. 2. 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 3. Unchanged punctate lesion in the left cerebellum, most likely a vessel but warranting attention on follow-up. 4. 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 Recommend ENT follow-up if not previously evaluated. Electronically Signed   By: Gilmore GORMAN Molt M.D.   On: 04/11/2024 13:00   PERIPHERAL VASCULAR CATHETERIZATION Result Date: 03/25/2024 See surgical note for result.

## 2024-06-20 NOTE — Assessment & Plan Note (Signed)
Durvalumab treatment as planned.  

## 2024-06-20 NOTE — Assessment & Plan Note (Signed)
 continue oral potassium 20meq daily, monitor

## 2024-06-20 NOTE — Patient Instructions (Signed)
 [facility] - A DEPT OF [facility]  Discharge Instructions: Thank you for choosing [facility] to provide your oncology and hematology care.  If you have a lab appointment with the Cancer Center, please go directly to the Cancer Center and check in at the registration area.  Wear comfortable clothing and clothing appropriate for easy access to any Portacath or PICC line.   We strive to give you quality time with your provider. You may need to reschedule your appointment if you arrive late (15 or more minutes).  Arriving late affects you and other patients whose appointments are after yours.  Also, if you miss three or more appointments without notifying the office, you may be dismissed from the clinic at the provider's discretion.      For prescription refill requests, have your pharmacy contact our office and allow 72 hours for refills to be completed.    Today you received the following chemotherapy and/or immunotherapy agents durvalumab      To help prevent nausea and vomiting after your treatment, we encourage you to take your nausea medication as directed.  BELOW ARE SYMPTOMS THAT SHOULD BE REPORTED IMMEDIATELY: *FEVER GREATER THAN 100.4 F (38 C) OR HIGHER *CHILLS OR SWEATING *NAUSEA AND VOMITING THAT IS NOT CONTROLLED WITH YOUR NAUSEA MEDICATION *UNUSUAL SHORTNESS OF BREATH *UNUSUAL BRUISING OR BLEEDING *URINARY PROBLEMS (pain or burning when urinating, or frequent urination) *BOWEL PROBLEMS (unusual diarrhea, constipation, pain near the anus) TENDERNESS IN MOUTH AND THROAT WITH OR WITHOUT PRESENCE OF ULCERS (sore throat, sores in mouth, or a toothache) UNUSUAL RASH, SWELLING OR PAIN  UNUSUAL VAGINAL DISCHARGE OR ITCHING   Items with * indicate a potential emergency and should be followed up as soon as possible or go to the Emergency Department if any problems should occur.  Please show the CHEMOTHERAPY ALERT CARD or IMMUNOTHERAPY  ALERT CARD at check-in to the Emergency Department and triage nurse.  Should you have questions after your visit or need to cancel or reschedule your appointment, please contact [facility] - A DEPT OF [facility]  [phone] and follow the prompts.  Office hours are 8:00 a.m. to 4:30 p.m. Monday - Friday. Please note that voicemails left after 4:00 p.m. may not be returned until the following business day.  We are closed weekends and major holidays. You have access to a nurse at all times for urgent questions. Please call the main number to the clinic [phone] and follow the prompts.  For any non-urgent questions, you may also contact your provider using MyChart. We now offer e-Visits for anyone 82 and older to request care online for non-urgent symptoms. For details visit [email or web address].   Also download the MyChart app! Go to the app store, search "MyChart", open the app, select [facility], and log in with your MyChart username and password.

## 2024-06-20 NOTE — Assessment & Plan Note (Signed)
 continue levothyroxine 125 mcg daily

## 2024-06-20 NOTE — Assessment & Plan Note (Signed)
 Off bevacizumab  04/11/2024 MRI brain w wo contrast results were reviewed. He is asymptomatic.  Follow up  with [name].

## 2024-06-20 NOTE — Addendum Note (Signed)
 Addended by: [name] on: 06/20/2024 08:35 PM   Modules accepted: Orders

## 2024-06-20 NOTE — Assessment & Plan Note (Signed)
 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  Labs are reviewed and discussed with patient. Proceed with Durvalumab 

## 2024-06-21 ENCOUNTER — Other Ambulatory Visit: Payer: Self-pay

## 2024-06-21 ENCOUNTER — Other Ambulatory Visit (HOSPITAL_COMMUNITY): Payer: Self-pay

## 2024-06-21 LAB — T4: T4, Total: 8.9 ug/dL (ref 4.5–12.0)

## 2024-06-23 ENCOUNTER — Telehealth: Payer: Self-pay | Admitting: Oncology

## 2024-06-23 NOTE — Telephone Encounter (Signed)
 Called pt to confirm that CT appt date/time/location still work - left vm w/appt info as an appt reminder - LH

## 2024-06-27 ENCOUNTER — Other Ambulatory Visit: Payer: Self-pay | Admitting: Oncology

## 2024-06-27 ENCOUNTER — Ambulatory Visit
Admission: RE | Admit: 2024-06-27 | Discharge: 2024-06-27 | Disposition: A | Source: Ambulatory Visit | Attending: Oncology

## 2024-06-27 ENCOUNTER — Other Ambulatory Visit (HOSPITAL_COMMUNITY): Payer: Self-pay

## 2024-06-27 DIAGNOSIS — C3491 Malignant neoplasm of unspecified part of right bronchus or lung: Secondary | ICD-10-CM | POA: Diagnosis not present

## 2024-06-27 MED ORDER — IOHEXOL 300 MG/ML  SOLN
100.0000 mL | Freq: Once | INTRAMUSCULAR | Status: AC | PRN
  Administered 2024-06-27: 100 mL via INTRAVENOUS

## 2024-06-30 ENCOUNTER — Other Ambulatory Visit: Payer: Self-pay

## 2024-06-30 ENCOUNTER — Other Ambulatory Visit (HOSPITAL_COMMUNITY): Payer: Self-pay

## 2024-06-30 ENCOUNTER — Other Ambulatory Visit: Payer: Self-pay | Admitting: Oncology

## 2024-06-30 MED ORDER — POTASSIUM CHLORIDE CRYS ER 20 MEQ PO TBCR
20.0000 meq | EXTENDED_RELEASE_TABLET | Freq: Every day | ORAL | 2 refills | Status: DC
  Filled 2024-06-30: qty 30, 30d supply, fill #0
  Filled 2024-07-22: qty 30, 30d supply, fill #1

## 2024-07-04 ENCOUNTER — Encounter: Payer: Self-pay | Admitting: Oncology

## 2024-07-04 ENCOUNTER — Other Ambulatory Visit: Payer: Self-pay

## 2024-07-04 ENCOUNTER — Inpatient Hospital Stay: Admitting: Oncology

## 2024-07-04 ENCOUNTER — Inpatient Hospital Stay

## 2024-07-04 ENCOUNTER — Ambulatory Visit: Payer: Self-pay | Admitting: Oncology

## 2024-07-04 VITALS — BP 144/80 | HR 93 | Temp 97.3°F | Resp 18 | Wt 189.0 lb

## 2024-07-04 DIAGNOSIS — D509 Iron deficiency anemia, unspecified: Secondary | ICD-10-CM

## 2024-07-04 DIAGNOSIS — E032 Hypothyroidism due to medicaments and other exogenous substances: Secondary | ICD-10-CM

## 2024-07-04 DIAGNOSIS — R569 Unspecified convulsions: Secondary | ICD-10-CM

## 2024-07-04 DIAGNOSIS — E876 Hypokalemia: Secondary | ICD-10-CM | POA: Diagnosis not present

## 2024-07-04 DIAGNOSIS — C3491 Malignant neoplasm of unspecified part of right bronchus or lung: Secondary | ICD-10-CM

## 2024-07-04 DIAGNOSIS — Z5112 Encounter for antineoplastic immunotherapy: Secondary | ICD-10-CM

## 2024-07-04 DIAGNOSIS — C7931 Secondary malignant neoplasm of brain: Secondary | ICD-10-CM

## 2024-07-04 LAB — CBC WITH DIFFERENTIAL/PLATELET
Abs Immature Granulocytes: 0.03 K/uL (ref 0.00–0.07)
Basophils Absolute: 0.1 K/uL (ref 0.0–0.1)
Basophils Relative: 1 %
Eosinophils Absolute: 0.3 K/uL (ref 0.0–0.5)
Eosinophils Relative: 4 %
HCT: 36.1 % — ABNORMAL LOW (ref 39.0–52.0)
Hemoglobin: 11.3 g/dL — ABNORMAL LOW (ref 13.0–17.0)
Immature Granulocytes: 0 %
Lymphocytes Relative: 17 %
Lymphs Abs: 1.4 K/uL (ref 0.7–4.0)
MCH: 23.9 pg — ABNORMAL LOW (ref 26.0–34.0)
MCHC: 31.3 g/dL (ref 30.0–36.0)
MCV: 76.3 fL — ABNORMAL LOW (ref 80.0–100.0)
Monocytes Absolute: 0.9 K/uL (ref 0.1–1.0)
Monocytes Relative: 12 %
Neutro Abs: 5.3 K/uL (ref 1.7–7.7)
Neutrophils Relative %: 66 %
Platelets: 181 K/uL (ref 150–400)
RBC: 4.73 MIL/uL (ref 4.22–5.81)
RDW: 18.1 % — ABNORMAL HIGH (ref 11.5–15.5)
WBC: 8.1 K/uL (ref 4.0–10.5)
nRBC: 0 % (ref 0.0–0.2)

## 2024-07-04 LAB — COMPREHENSIVE METABOLIC PANEL WITH GFR
ALT: 12 U/L (ref 0–44)
AST: 21 U/L (ref 15–41)
Albumin: 4.1 g/dL (ref 3.5–5.0)
Alkaline Phosphatase: 85 U/L (ref 38–126)
Anion gap: 12 (ref 5–15)
BUN: 8 mg/dL (ref 8–23)
CO2: 23 mmol/L (ref 22–32)
Calcium: 9.3 mg/dL (ref 8.9–10.3)
Chloride: 104 mmol/L (ref 98–111)
Creatinine, Ser: 0.94 mg/dL (ref 0.61–1.24)
GFR, Estimated: >60 mL/min (ref >60)
Glucose, Bld: 127 mg/dL — ABNORMAL HIGH (ref 70–99)
Potassium: 3.8 mmol/L (ref 3.5–5.1)
Sodium: 139 mmol/L (ref 135–145)
Total Bilirubin: 0.4 mg/dL (ref 0.0–1.2)
Total Protein: 6.9 g/dL (ref 6.5–8.1)

## 2024-07-04 LAB — RETIC PANEL
Immature Retic Fract: 13.5 % (ref 2.3–15.9)
RBC.: 4.71 MIL/uL (ref 4.22–5.81)
Retic Count, Absolute: 41 K/uL (ref 19.0–186.0)
Retic Ct Pct: 0.9 % (ref 0.4–3.1)
Reticulocyte Hemoglobin: 24.1 pg — ABNORMAL LOW (ref >27.9)

## 2024-07-04 LAB — IRON AND TIBC
Iron: 30 ug/dL — ABNORMAL LOW (ref 45–182)
Saturation Ratios: 7 % — ABNORMAL LOW (ref 17.9–39.5)
TIBC: 420 ug/dL (ref 250–450)
UIBC: 390 ug/dL

## 2024-07-04 LAB — TSH: TSH: 10.9 u[IU]/mL — ABNORMAL HIGH (ref 0.350–4.500)

## 2024-07-04 LAB — FERRITIN: Ferritin: 20 ng/mL — ABNORMAL LOW (ref 24–336)

## 2024-07-04 MED ORDER — SODIUM CHLORIDE 0.9 % IV SOLN
10.0000 mg/kg | Freq: Once | INTRAVENOUS | Status: AC
  Administered 2024-07-04: 12:00:00 860 mg via INTRAVENOUS
  Filled 2024-07-04: qty 7.2

## 2024-07-04 MED ORDER — SODIUM CHLORIDE 0.9 % IV SOLN
Freq: Once | INTRAVENOUS | Status: AC
  Filled 2024-07-04: qty 250

## 2024-07-04 NOTE — Assessment & Plan Note (Signed)
 Off bevacizumab  04/11/2024 MRI brain w wo contrast results were reviewed. He is asymptomatic.  Follow up  with [name].

## 2024-07-04 NOTE — Assessment & Plan Note (Signed)
 Lab Results  Component Value Date   HGB 11.3 (L) 07/04/2024   TIBC 420 07/04/2024   IRONPCTSAT 7 (L) 07/04/2024   FERRITIN 20 (L) 07/04/2024    Recommend patient to start oral iron supplementation.  Possible GI blood from being on Aspirin  and Plavix 

## 2024-07-04 NOTE — Assessment & Plan Note (Signed)
Durvalumab treatment as planned.  

## 2024-07-04 NOTE — Assessment & Plan Note (Signed)
 continue levothyroxine 125 mcg daily

## 2024-07-04 NOTE — Progress Notes (Signed)
 Hematology/Oncology Progress note [phone]) [phone] Fax:([phone]) [phone]    CHIEF COMPLAINTS/REASON FOR VISIT:  Follow up for lung cancer  ASSESSMENT & PLAN:   Cancer Staging  Squamous cell carcinoma of right lung (HCC) Staging form: Lung, AJCC 8th Edition - Clinical stage from 09/20/2020: Stage IV (cT1b, cN2, cM1) - Signed by [name], MD on 12/13/2021   Squamous cell carcinoma of right lung (HCC) Durvalumab  was previously held due to pneumonitis.  Labs are reviewed and discussed with patient. Proceed with Durvalumab   CT scan showed NED.    Encounter for antineoplastic immunotherapy Durvalumab  treatment as planned.   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 He is asymptomatic.  Follow up  with [name].   Seizure (HCC) Follow up with [name].  Continue keppra      Follow up  2 weeks lab MD Durvalumab   All questions were answered. The patient knows to call the clinic with any problems, questions or concerns.  Call Babara, MD, PhD [facility] Hematology Oncology 07/04/2024       HISTORY OF PRESENTING ILLNESS:  Joshua [name] is a 68 y.o. male who has above history reviewed by me today presents for follow up visit for Stage IV lung squamous cell carcinoma. Oncology History  Squamous cell carcinoma of right lung (HCC)  09/18/2020 Imaging   PET scan showed right upper lobe nodule maximal SUV 11.5.  Hypermetabolic cluster right paratracheal adenopathy with maximum SUV up to 12.  No findings of metastatic disease to the neck/abdomen/pelvis or skeleton.   09/20/2020 Initial Diagnosis   Squamous cell carcinoma of right lung (HCC) NGS showed PD-L1 TPS 95%, LRP1B S997fs, PIK3CA E545K, RB1 c1422-2A>T, RIT1 T83_ A84del, STK11 P281fs, TPS53 R248W, TMB  19.3mut/mb, MS stable.    09/20/2020 Cancer Staging   Staging form: Lung, AJCC 8th Edition - Clinical stage from 09/20/2020: Stage IV (cT1b, cN2, cM1) - Signed by [name], MD on 12/13/2021   10/03/2020 Imaging   MRI brain with and without contrast showed no evidence of intracranial metastatic disease. Well-defined round lesion within the left parotid tail measuring approximately 2.3 x 1.8 x 1.8 cm   10/09/2020 - 11/27/2020 Radiation Therapy   concurrent chemoradiation   10/10/2020 - 10/31/2020 Chemotherapy   10/10/2020 concurrent chemoradiation.  carboplatin  AUC of 2 and Taxol  45 mg/m2 x 4.  Additional chemotherapy was held due to thrombocytopenia, neutropenia       12/31/2020 - 03/21/2022 Chemotherapy   Maintenance  Durvalumab  q14d      12/31/2020 -  Chemotherapy   Patient is on Treatment Plan : LUNG Durvalumab  (10) q14d     03/28/2021 Imaging   CT without contrast showed new linear right perihilar consolidation with associated traction bronchiectasis.  Likely postradiation changes.  Additional new peribronchial vascular/nodular groundglass opacities with areas of consolidation or seen primarily in the right upper and lower lobes, more distant from expected radiation fields. 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  Per my discussion with pulmonology Dr. 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     05/09/2021 Imaging   Repeat CT chest without contrast showed evolving radiation changes in the right perihilar region likely accounting for interval increase to thickening of the posterior wall of the right  upper lobe bronchus.  The more peripheral right lung groundglass opacities seen on most recent study have generally improved with exception of one peripherally right upper lobe which appears new.  These are likely inflammatory.  No findings highly suspicious for local  recurrence.   05/30/2021 - 01/10/2022 Chemotherapy   Durvalumab  q14d     10/07/2021 Imaging   MRI brain with and without contrast showed 2 contrast-enhancing lesions, most consistent with metastatic disease.  Largest lesion is in the left frontal lobe and measures up to 1.3 cm with a large amount of surrounding edema and 9 mm rightward midline shift.  The other enhancing lesion was 4 mm in the right occipital lobe with a small amount of surrounding edema.  No acute or chronic hemorrhage.   10/07/2021 Progression   Headache after colonoscopy, progressive difficulty using her right arm.  Progressively worsening of speech. Stage IV lung cancer with brain metastasis.  started on Dexamethasone .  He was seen by neurosurgery and radiation oncology.  Recommendation is to proceed with SRS.  Patient was discharged with dexamethasone    10/16/2021 Imaging   PET scan showed postradiation changes in the right supra hilar lung without evidence of local recurrence.  No evidence of lung cancer recurrence or metastatic disease.  Single focus of intense metabolic activity associated with posterior left 11th rib likely secondary to recent fall/posttraumatic metabolic changes.  Frontal  uptake in the left frontal lobe corresponding to the known metastatic lesion in the brain   10/29/2021 -  Radiation Therapy   Brain Radiation [facility]   12/10/2021 Imaging   Brain MRI: Continued interval decrease in size of a left frontal lobe metastasis, now measuring 10 mm. Moderate surrounding edema has also decreased.  3-4 mm metastasis within the right occipital lobe, not significantly changed in size. There is now a small focus of central non enhancement within this lesion. Increased edema. 3 mm enhancing metastasis within the high left parietal lobe. In retrospect, this was likely present as a subtle punctate focus of enhancement on the prior brain MRI of 10/22/2021.   01/18/2022 Imaging   CT chest abdomen pelvis w contrast 1. Stable  post treatment changes in the right upper lobe medially with dense radiation fibrosis but no findings to suggest local recurrent tumor. 2. Significant progression of ill-defined ground-glass opacity, interstitial thickening and airspace nodularity in the right upper lobe and upper aspect of the right lower lobe. Findings could reflect an inflammatory or atypical infectious process, drug induced pneumonitis or interstitial spread of tumor.3. No mediastinal or hilar mass or adenopathy. 4. No findings for abdominal/pelvic metastatic disease or osseous metastatic disease.   02/19/2022 Imaging   MRI brain w wo contrast showed Three metastatic deposits in the brain appear smaller. Decreased edema in the left parietal lobe. No new lesions    04/04/2022 Imaging   CT chest w contrast 1. Stable radiation changes in the right perihilar region without evidence of local recurrence. 2. Interval improvement in the patchy ground-glass opacities previously demonstrated in both lungs, most consistent with a resolving infectious or inflammatory process (including immunotherapy-related pneumonitis). 3. No evidence of metastatic disease.   05/14/2022 Miscellaneous   He had a mechanical fall and fractured his left elbow. xrays showed a left elbow fracture  05/26/22 seen by orthopedic surgeon, cast applied.    05/27/2022 Imaging   MRI brain w wo contrast 1. All three known brain metastases have enlarged since August, with increasing regional edema. And there is a small new left anterior  cerebellar 4-5 mm metastasis (was questionably punctate on the May MRI). This constellation could reflect a combination of True Progression (Left Cerebellar lesion) and pseudoprogression/radionecrosis (3 existing mets).    2. No significant intracranial mass effect. 3. Underlying moderate cerebral white matter signal changes.   05/30/2022 Imaging   CT chest abdomen pelvis w contrast  1. Stable examination demonstrating chronic  postradiation mass-like fibrosis in the right lung with resolving postradiation pneumonitis in the adjacent lung parenchyma. There is a stable small right pleural effusion, but no definitive findings to suggest residual or recurrent disease on today's examination. 2. No signs of metastatic disease in the abdomen or pelvis.  3. Aortic atherosclerosis, in addition to two-vessel coronary artery disease. Please note that although the presence of coronary artery calcium  documents the presence of coronary artery disease, the severity of this disease and any potential stenosis cannot be assessed on this non-gated CT examination. Assessment for potential risk factor modification, dietary therapy or pharmacologic therapy may be warranted, if clinically indicated. 4. Hepatic steatosis. 5. Colonic diverticulosis without evidence of acute diverticulitis at this time. 6. Incompletely imaged but apparently occluded vascular graft in the superior aspect of the right anterior thigh. Referral to vascular specialist for further clinical evaluation should be considered if clinically appropriate. 7. Additional incidental findings, as above.   07/22/2022 Imaging   MRI brain  1. No new or enlarging lesions. No intracranial disease progression. 2. Decreased size of treated lesions of the left frontoparietal junction and left postcentral gyrus. 3. Unchanged lesions in the left cerebellum and right occipital lobe.   10/15/2022 Imaging   MRI brain w wo contrast showed Interval increase in size of multiple intraparenchymal lesions with worsened surrounding vasogenic edema. 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 No new lesions   11/05/2022 Imaging   MRI brain w wo contrast  1. Likely slight increase and right occipital and left cerebellar lesions. Otherwise, unchanged metastases, as above. No new lesions identified. 2. Partially imaged large  probable lymph node in the left upper neck, apparent on prior PET CT.   11/13/2022 -  Radiation Therapy   Each of the sites were treated with SRS/SBRT/SRT-IMRT technique to 20 Gy in 1 fraction to each of the following sites: PTV_2_LCerebel_89mm PTV_3_RCerebel_66mm PTV_4_ROccip_19mm PTV_5_LParietal_76mm   11/27/2022 Imaging   CT chest abdomen pelvis w contrast showed  Improving bilateral scattered interstitial and ground-glass opacities. Small amount of residual areas in the extreme left lower lobe inferiorly. This specific area is slightly increased from previous but overall the amount of opacities is significantly improved. Recommend continued follow-up. No developing new mass lesion, fluid collection or lymph node enlargement. Stable posttreatment changes right perihilar in the lung. Fatty liver infiltration. Colonic diverticulosis. Improved tiny right pleural effusion   02/05/2023 Imaging   MRI brain with and without contrast Interval decrease in all of the metastatic lesions, detailed above.No new lesions identified.    Imaging   CT chest abdomen pelvis w contrast showed  1. 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 2. Trace right pleural fluid or thickening, new or minimally increased. 3. Incidental findings, including: Aortic atherosclerosis (ICD10-I70.0), coronary artery atherosclerosis and emphysema (ICD10-J43.9). Hepatic steatosis.   06/25/2023 Imaging   CT chest abdomen pelvis w contrast showed 1. Unchanged post treatment/post radiation appearance of the right lung, with perihilar and suprahilar paramedian fibrosis and consolidation. 2. No evidence of lymphadenopathy or metastatic disease in the  chest, abdomen, or pelvis. 3. Hepatic steatosis. 4. Coronary artery disease.   Aortic Atherosclerosis (ICD10-I70.0) and Emphysema (ICD10-J43.9).   08/26/2023 [facility] Admission   hospitalization due to was observed  speech and jerking movements on right side despite being on dexamethasone . Continuous EEG suggest cortical dysfunction arising from left temporal region.  No seizure or elective form discharges were seen. Patient was discharged on dexamethasone  4 mg, Keppra , lamotrigine .    11/04/2023 -  Chemotherapy   Add Bevacizumab  5mg /kg to Durvalumab .     11/17/2023 - 11/27/2023 Radiation Therapy   Patient received radiation to brain for treatment of recurrence.   02/23/2024 Imaging   CT chest abdomen pelvis w contrast showed  CHEST:   1. Post RIGHT upper lobe resection. 2. No evidence of lung cancer recurrence. 3. No mediastinal adenopathy.   PELVIS:   1. No evidence of metastatic disease in the abdomen pelvis. 2. No skeletal metastasis.     INTERVAL HISTORY Joshua [name] is a 68 y.o. male who has above history reviewed by me today presents for follow up visit for right lung RCC.  chronic SOB,not worse.  Peripheral artery disease,Patient is on aspirin  and Plavix . No new complaints   Review of Systems  Constitutional:  Positive for fatigue. Negative for appetite change, chills, fever and unexpected weight change.  HENT:   Negative for hearing loss and voice change.   Eyes:  Negative for eye problems and icterus.  Respiratory:  Negative for chest tightness, cough and shortness of breath.   Cardiovascular:  Negative for chest pain and leg swelling.  Gastrointestinal:  Negative for abdominal distention, abdominal pain, nausea and vomiting.  Endocrine: Negative for hot flashes.  Genitourinary:  Negative for difficulty urinating, dysuria and frequency.   Musculoskeletal:  Positive for gait problem. Negative for arthralgias.  Skin:  Negative for itching and rash.  Neurological:  Positive for gait problem. Negative for extremity weakness, headaches, numbness and speech difficulty.  Hematological:  Negative for adenopathy. Bruises/bleeds easily.  Psychiatric/Behavioral:  Negative for  confusion.       MEDICAL HISTORY:  Past Medical History:  Diagnosis Date   Allergy    Cancer (HCC)    lung   COPD (chronic obstructive pulmonary disease) (HCC)    Dyspnea    former smoker. 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   Malignant neoplasm metastatic to brain [facility])    Nausea with vomiting 02/01/2024   Peripheral vascular disease    Pre-diabetes    Sleep apnea     SURGICAL HISTORY: Past Surgical History:  Procedure Laterality Date   ANTERIOR CERVICAL DECOMP/DISCECTOMY FUSION N/A 03/14/2020   Procedure: ANTERIOR CERVICAL DECOMPRESSION/DISCECTOMY FUSION 3 LEVELS C4-7;  Surgeon: Clois Fret, MD;  Location: [facility] ORS;  Service: Neurosurgery;  Laterality: N/A;   BACK SURGERY  2011   CENTRAL LINE INSERTION Right 09/10/2016   Procedure: CENTRAL LINE INSERTION;  Surgeon: Jama Cordella MATSU, MD;  Location: [facility] ORS;  Service: Vascular;  Laterality: Right;   CERVICAL FUSION     C4-c7 on Mar 14, 2020   COLONOSCOPY  2013 ?   COLONOSCOPY N/A 09/09/2021   Procedure: COLONOSCOPY;  Surgeon: Onita Elspeth Sharper, DO;  Location: [facility] ENDOSCOPY;  Service: Gastroenterology;  Laterality: N/A;   CORONARY ANGIOPLASTY     ESOPHAGOGASTRODUODENOSCOPY N/A 09/09/2021   Procedure: ESOPHAGOGASTRODUODENOSCOPY (EGD);  Surgeon: Onita Elspeth Sharper, DO;  Location: [facility] ENDOSCOPY;  Service: Gastroenterology;  Laterality: N/A;  FEMORAL-POPLITEAL BYPASS GRAFT Right 09/10/2016   Procedure: BYPASS GRAFT FEMORAL-POPLITEAL ARTERY ( BELOW KNEE );  Surgeon: Jama Cordella MATSU, MD;  Location: [facility] ORS;  Service: Vascular;  Laterality: Right;   FEMORAL-TIBIAL BYPASS GRAFT Right 03/23/2020   Procedure: BYPASS GRAFT RIGHT FEMORAL- DISTAL TIBIAL ARTERY WITH DISTAL FLOW GRAFT;  Surgeon: Jama Cordella MATSU, MD;  Location: [facility] ORS;  Service: Vascular;  Laterality: Right;   LEFT HEART CATH AND CORONARY ANGIOGRAPHY Left 05/16/2021   Procedure: LEFT HEART  CATH AND CORONARY ANGIOGRAPHY;  Surgeon: Lawyer Bernardino Cough, MD;  Location: [facility] INVASIVE CV LAB;  Service: Cardiovascular;  Laterality: Left;   LOWER EXTREMITY ANGIOGRAPHY Right 11/18/2016   Procedure: Lower Extremity Angiography;  Surgeon: Cordella MATSU Jama, MD;  Location: [facility] INVASIVE CV LAB;  Service: Cardiovascular;  Laterality: Right;   LOWER EXTREMITY ANGIOGRAPHY Right 12/09/2016   Procedure: Lower Extremity Angiography;  Surgeon: Jama Cordella MATSU, MD;  Location: [facility] INVASIVE CV LAB;  Service: Cardiovascular;  Laterality: Right;   LOWER EXTREMITY ANGIOGRAPHY Right 11/15/2019   Procedure: LOWER EXTREMITY ANGIOGRAPHY;  Surgeon: Jama Cordella MATSU, MD;  Location: [facility] INVASIVE CV LAB;  Service: Cardiovascular;  Laterality: Right;   LOWER EXTREMITY ANGIOGRAPHY Right 12/01/2019   Procedure: Lower Extremity Angiography;  Surgeon: Marea Selinda RAMAN, MD;  Location: [facility] INVASIVE CV LAB;  Service: Cardiovascular;  Laterality: Right;   LOWER EXTREMITY ANGIOGRAPHY Right 12/02/2019   Procedure: LOWER EXTREMITY ANGIOGRAPHY;  Surgeon: Jama Cordella MATSU, MD;  Location: [facility] INVASIVE CV LAB;  Service: Cardiovascular;  Laterality: Right;   LOWER EXTREMITY ANGIOGRAPHY Right 03/23/2020   Procedure: Lower Extremity Angiography;  Surgeon: Jama Cordella MATSU, MD;  Location: [facility] INVASIVE CV LAB;  Service: Cardiovascular;  Laterality: Right;   LOWER EXTREMITY ANGIOGRAPHY Right 09/25/2021   Procedure: Lower Extremity Angiography;  Surgeon: Jama Cordella MATSU, MD;  Location: [facility] INVASIVE CV LAB;  Service: Cardiovascular;  Laterality: Right;   LOWER EXTREMITY ANGIOGRAPHY Right 09/26/2021   Procedure: Lower Extremity Angiography;  Surgeon: Marea Selinda RAMAN, MD;  Location: [facility] INVASIVE CV LAB;  Service: Cardiovascular;  Laterality: Right;   LOWER EXTREMITY ANGIOGRAPHY Right 04/08/2022   Procedure: Lower Extremity Angiography;  Surgeon: Jama Cordella MATSU, MD;  Location: [facility] INVASIVE CV LAB;  Service: Cardiovascular;  Laterality:  Right;   LOWER EXTREMITY ANGIOGRAPHY Right 03/04/2024   Procedure: Lower Extremity Angiography;  Surgeon: Jama Cordella MATSU, MD;  Location: [facility] INVASIVE CV LAB;  Service: Cardiovascular;  Laterality: Right;   LOWER EXTREMITY ANGIOGRAPHY Right 03/25/2024   Procedure: Lower Extremity Angiography;  Surgeon: Jama Cordella MATSU, MD;  Location: [facility] INVASIVE CV LAB;  Service: Cardiovascular;  Laterality: Right;   LOWER EXTREMITY INTERVENTION  11/18/2016   Procedure: Lower Extremity Intervention;  Surgeon: Cordella MATSU Jama, MD;  Location: [facility] INVASIVE CV LAB;  Service: Cardiovascular;;   LOWER EXTREMITY INTERVENTION Right 03/04/2024   Procedure: LOWER EXTREMITY INTERVENTION;  Surgeon: Jama Cordella MATSU, MD;  Location: [facility] INVASIVE CV LAB;  Service: Cardiovascular;  Laterality: Right;   PERIPHERAL VASCULAR CATHETERIZATION Right 01/02/2015   Procedure: Lower Extremity Angiography;  Surgeon: Cordella MATSU Jama, MD;  Location: [facility] INVASIVE CV LAB;  Service: Cardiovascular;  Laterality: Right;   PERIPHERAL VASCULAR CATHETERIZATION Right 06/18/2015   Procedure: Lower Extremity Angiography;  Surgeon: Selinda RAMAN Marea, MD;  Location: [facility] INVASIVE CV LAB;  Service: Cardiovascular;  Laterality: Right;   PERIPHERAL VASCULAR CATHETERIZATION  06/18/2015   Procedure: Lower Extremity Intervention;  Surgeon: Selinda RAMAN Marea, MD;  Location: [facility] INVASIVE CV LAB;  Service: Cardiovascular;;  PERIPHERAL VASCULAR CATHETERIZATION N/A 10/09/2015   Procedure: Abdominal Aortogram w/Lower Extremity;  Surgeon: Cordella KANDICE Shawl, MD;  Location: [facility] INVASIVE CV LAB;  Service: Cardiovascular;  Laterality: N/A;   PERIPHERAL VASCULAR CATHETERIZATION  10/09/2015   Procedure: Lower Extremity Intervention;  Surgeon: Cordella KANDICE Shawl, MD;  Location: [facility] INVASIVE CV LAB;  Service: Cardiovascular;;   PERIPHERAL VASCULAR CATHETERIZATION Right 10/10/2015   Procedure: Lower Extremity Angiography;  Surgeon: Selinda GORMAN Gu, MD;  Location: [facility] INVASIVE CV  LAB;  Service: Cardiovascular;  Laterality: Right;   PERIPHERAL VASCULAR CATHETERIZATION Right 10/10/2015   Procedure: Lower Extremity Intervention;  Surgeon: Selinda GORMAN Gu, MD;  Location: [facility] INVASIVE CV LAB;  Service: Cardiovascular;  Laterality: Right;   PERIPHERAL VASCULAR CATHETERIZATION Right 12/03/2015   Procedure: Lower Extremity Angiography;  Surgeon: Selinda GORMAN Gu, MD;  Location: [facility] INVASIVE CV LAB;  Service: Cardiovascular;  Laterality: Right;   PERIPHERAL VASCULAR CATHETERIZATION Right 12/04/2015   Procedure: Lower Extremity Angiography;  Surgeon: Selinda GORMAN Gu, MD;  Location: [facility] INVAS[facility] CV LAB;  Service: Cardiovascular;  Laterality: Right;   PERIPHERAL VASCULAR CATHETERIZATION  12/04/2015   Procedure: Lower Extremity Intervention;  Surgeon: Selinda GORMAN Gu, MD;  Location: [facility] INVAS[facility] CV LAB;  Service: Cardiovascular;;   PERIPHERAL VASCULAR CATHETERIZATION Right 06/30/2016   Procedure: Lower Extremity Angiography;  Surgeon: Selinda GORMAN Gu, MD;  Location: [facility] INVASIVE CV LAB;  Service: Cardiovascular;  Laterality: Right;   PERIPHERAL VASCULAR CATHETERIZATION Right 06/25/2016   Procedure: Lower Extremity Angiography;  Surgeon: Cordella KANDICE Shawl, MD;  Location: [facility] INV[facility] CV LAB;  Service: Cardiovascular;  Laterality: Right;   PERIPHERAL VASCULAR CATHETERIZATION Right 07/01/2016   Procedure: Lower Extremity Angiography;  Surgeon: Cordella KANDICE Shawl, MD;  Location: [facility] INVASIVE CV LAB;  Service: Cardiovascular;  Laterality: Right;   PERIPHERAL VASCULAR CATHETERIZATION  07/01/2016   Procedure: Lower Extremity Intervention;  Surgeon: Cordella KANDICE Shawl, MD;  Location: [facility] INVASIVE CV LAB;  Service: Cardiovascular;;   PERIPHERAL VASCULAR CATHETERIZATION Right 08/01/2016   Procedure: Lower Extremity Angiography;  Surgeon: Cordella KANDICE Shawl, MD;  Location: [facility] INVASIVE CV LAB;  Service: Cardiovascular;  Laterality: Right;   PORTA CATH INSERTION N/A 10/05/2020   Procedure: PORTA CATH INSERTION;   Surgeon: Shawl Cordella KANDICE, MD;  Location: [facility] INVASIVE CV LAB;  Service: Cardiovascular;  Laterality: N/A;   stent placement in right leg Right    VIDEO BRONCHOSCOPY N/A 09/14/2020   Procedure: VIDEO BRONCHOSCOPY WITH FLUORO;  Surgeon: Tamea Dedra CROME, MD;  Location: [facility] ORS;  Service: Cardiopulmonary;  Laterality: N/A;   VIDEO BRONCHOSCOPY WITH ENDOBRONCHIAL ULTRASOUND N/A 09/14/2020   Procedure: VIDEO BRONCHOSCOPY WITH ENDOBRONCHIAL ULTRASOUND;  Surgeon: Tamea Dedra CROME, MD;  Location: [facility] ORS;  Service: Cardiopulmonary;  Laterality: N/A;    SOCIAL HISTORY: Social History   Socioeconomic History   Marital status: Single    Spouse name: Not on file   Number of children: Not on file   Years of education: Not on file   Highest education level: Not on file  Occupational History   Occupation: unemployed  Tobacco Use   Smoking status: Former    Current packs/day: 0.00    Average packs/day: 2.0 packs/day for 38.0 years (76.0 ttl pk-yrs)    Types: Cigarettes    Start date: 12/19/1976    Quit date: 12/20/2014    Years since quitting: 9.5   Smokeless tobacco: Former   Tobacco comments:    quit smoking in 2017 most smoked 2   Vaping Use   Vaping status: Never  Used  Substance and Sexual Activity   Alcohol  use: Not Currently   Drug use: No   Sexual activity: Yes  Other Topics Concern   Not on file  Social History Narrative   Not on file   Social Drivers of Health   Tobacco Use: Medium Risk (07/04/2024)   Patient History    Smoking Tobacco Use: Former    Smokeless Tobacco Use: Former    Passive Exposure: Not on Actuary Strain: Medium Risk (02/05/2024)   Overall Financial Resource Strain (CARDIA)    Difficulty of Paying Living Expenses: Somewhat hard  Food Insecurity: No Food Insecurity (03/25/2024)   Epic    Worried About Programme Researcher, Broadcasting/film/video in the Last Year: Never true    Ran Out of Food in the Last Year: Never true  Transportation Needs: No  Transportation Needs (03/25/2024)   Epic    Lack of Transportation (Medical): No    Lack of Transportation (Non-Medical): No  Physical Activity: Inactive (02/05/2024)   Exercise Vital Sign    Days of Exercise per Week: 1 day    Minutes of Exercise per Session: 0 min  Stress: No Stress Concern Present (02/05/2024)   [employer] of Occupational Health - Occupational Stress Questionnaire    Feeling of Stress: Only a little  Social Connections: Socially Isolated (03/25/2024)   Social Connection and Isolation Panel    Frequency of Communication with Friends and Family: Three times a week    Frequency of Social Gatherings with Friends and Family: Once a week    Attends Religious Services: Never    Database Administrator or Organizations: No    Attends Banker Meetings: Never    Marital Status: Never married  Intimate Partner Violence: Not At Risk (03/25/2024)   Epic    Fear of Current or Ex-Partner: No    Emotionally Abused: No    Physically Abused: No    Sexually Abused: No  Depression (PHQ2-9): Low Risk (06/06/2024)   Depression (PHQ2-9)    PHQ-2 Score: 0  Alcohol  Screen: Not on file  Housing: Low Risk (03/25/2024)   Epic    Unable to Pay for Housing in the Last Year: No    Number of Times Moved in the Last Year: 0    Homeless in the Last Year: No  Utilities: Not At Risk (03/25/2024)   Epic    Threatened with loss of utilities: No  Health Literacy: Not on file    FAMILY HISTORY: Family History  Problem Relation Age of Onset   Diabetes Mother    Hypertension Mother    Heart murmur Mother    Leukemia Mother    Throat cancer Maternal Grandmother    Colon cancer Neg Hx    Breast cancer Neg Hx     ALLERGIES:  has no known allergies.  MEDICATIONS:  Current Outpatient Medications  Medication Sig Dispense Refill   aspirin  EC 81 MG tablet Take 81 mg by mouth daily. Swallow whole.     clopidogrel  (PLAVIX ) 75 MG tablet Take 1 tablet (75 mg total) by mouth daily. 100  tablet 2   levETIRAcetam  (KEPPRA ) 1000 MG tablet Take 1 tablet (1,000 mg total) by mouth 2 (two) times daily. 60 tablet 2   levothyroxine  (SYNTHROID ) 125 MCG tablet Take 1 tablet (125 mcg total) by mouth daily before breakfast. 90 tablet 0   potassium chloride  SA (KLOR-CON  M) 20 MEQ tablet Take 1 tablet (20 mEq total) by mouth daily. 30  tablet 2   simvastatin  (ZOCOR ) 40 MG tablet Take 1 tablet (40 mg total) by mouth daily. 30 tablet 0   No current facility-administered medications for this visit.   Facility-Administered Medications Ordered in Other Visits  Medication Dose Route Frequency Provider Last Rate Last Admin   albuterol  (PROVENTIL ) (2.5 MG/3ML) 0.083% nebulizer solution 2.5 mg  2.5 mg Nebulization Once Tamea Dedra CROME, MD       heparin  lock flush 100 unit/mL  500 Units Intracatheter Once PRN [name], MD         PHYSICAL EXAMINATION: ECOG PERFORMANCE STATUS: 1 - Symptomatic but completely ambulatory Vitals:   07/04/24 1045 07/04/24 1053  BP: (!) 161/89 (!) 144/80  Pulse: 93   Resp: 18   Temp: (!) 97.3 F (36.3 C)   SpO2: 97%     Filed Weights   07/04/24 1045  Weight: 189 lb (85.7 kg)     Physical Exam Constitutional:      General: He is not in acute distress.    Appearance: He is obese.  HENT:     Head: Normocephalic and atraumatic.  Eyes:     General: No scleral icterus. Cardiovascular:     Rate and Rhythm: Normal rate and regular rhythm.  Pulmonary:     Effort: Pulmonary effort is normal. No respiratory distress.     Breath sounds: Normal breath sounds. No wheezing.  Abdominal:     General: There is no distension.     Palpations: Abdomen is soft.     Tenderness: There is no abdominal tenderness.  Musculoskeletal:     Cervical back: Normal range of motion and neck supple.     Comments: Right lower extremity wrapped.   Skin:    General: Skin is warm and dry.     Findings: Bruising present. No erythema.  Neurological:     Mental Status: He is alert  and oriented to person, place, and time. Mental status is at baseline.     Cranial Nerves: No cranial nerve deficit.     Motor: No abnormal muscle tone.  Psychiatric:        Mood and Affect: Mood and affect normal.         LABORATORY DATA:  I have reviewed the data as listed    Latest Ref Rng & Units 07/04/2024   10:33 AM 06/20/2024   10:57 AM 06/06/2024    9:02 AM  CBC  WBC 4.0 - 10.5 K/uL 8.1  7.0  5.5   Hemoglobin 13.0 - 17.0 g/dL 88.6  88.2  88.7   Hematocrit 39.0 - 52.0 % 36.1  37.6  36.4   Platelets 150 - 400 K/uL 181  231  225       Latest Ref Rng & Units 07/04/2024   10:33 AM 06/20/2024   10:57 AM 06/06/2024    9:02 AM  CMP  Glucose 70 - 99 mg/dL 872  884  837   BUN 8 - 23 mg/dL 8  12  10    Creatinine 0.61 - 1.24 mg/dL 9.05  8.96  9.13   Sodium 135 - 145 mmol/L 139  139  136   Potassium 3.5 - 5.1 mmol/L 3.8  3.9  3.2   Chloride 98 - 111 mmol/L 104  105  104   CO2 22 - 32 mmol/L 23  24  22    Calcium  8.9 - 10.3 mg/dL 9.3  8.8  8.6   Total Protein 6.5 - 8.1 g/dL 6.9  6.9  6.9  Total Bilirubin 0.0 - 1.2 mg/dL 0.4  0.7  0.4   Alkaline Phos 38 - 126 U/L 85  74  67   AST 15 - 41 U/L 21  26  26    ALT 0 - 44 U/L 12  16  14       RADIOGRAPHIC STUDIES: I have personally reviewed the radiological images as listed and agreed with the findings in the report. CT CHEST ABDOMEN PELVIS W CONTRAST Result Date: 06/30/2024 CLINICAL DATA:  Lung cancer restaging * Tracking Code: BO * EXAM: CT CHEST, ABDOMEN, AND PELVIS WITH CONTRAST TECHNIQUE: Multidetector CT imaging of the chest, abdomen and pelvis was performed following the standard protocol during bolus administration of intravenous contrast. RADIATION DOSE REDUCTION: This exam was performed according to the departmental dose-optimization program which includes automated exposure control, adjustment of the mA and/or kV according to patient size and/or use of iterative reconstruction technique. CONTRAST:  OMNIPAQUE  IOHEXOL   300 MG/ML  SOLN COMPARISON:  02/23/2024 FINDINGS: CT CHEST FINDINGS Cardiovascular: Right chest port catheter. Scattered aortic atherosclerosis. Normal heart size. Scattered left and right coronary artery calcifications. No pericardial effusion. Mediastinum/Nodes: Unchanged treated soft tissue about the right hilum (series 2, image 28). No discretely enlarged mediastinal, hilar, or axillary lymph nodes. Thyroid  gland, trachea, and esophagus demonstrate no significant findings. Lungs/Pleura: Unchanged post treatment/post radiation appearance of the right chest with dense perihilar and suprahilar consolidation and fibrosis. Mild underlying centrilobular emphysema. Occasional small pulmonary nodules unchanged, for example a 0.4 cm nodule of the dependent right lower lobe (series 4, image 71). No pleural effusion or pneumothorax. Musculoskeletal: No chest wall abnormality. No acute osseous findings. CT ABDOMEN PELVIS FINDINGS Hepatobiliary: No solid liver abnormality is seen. No gallstones, gallbladder wall thickening, or biliary dilatation. Pancreas: Unremarkable. No pancreatic ductal dilatation or surrounding inflammatory changes. Spleen: Normal in size without significant abnormality. Adrenals/Urinary Tract: Adrenal glands are unremarkable. Kidneys are normal, without renal calculi, solid lesion, or hydronephrosis. Bladder is unremarkable. Stomach/Bowel: Stomach is within normal limits. Appendix appears normal. No evidence of bowel wall thickening, distention, or inflammatory changes. Sigmoid diverticulosis. Vascular/Lymphatic: Aortic atherosclerosis. Partially imaged right femoral bypass grafting (series 2, image 135). No enlarged abdominal or pelvic lymph nodes. Reproductive: No mass or other abnormality. Other: No abdominal wall hernia or abnormality. No ascites. Musculoskeletal: No acute osseous findings. IMPRESSION: 1. Unchanged post treatment/post radiation appearance of the right chest with dense perihilar and  suprahilar consolidation and fibrosis. 2. Unchanged treated soft tissue about the right hilum. No discretely enlarged mediastinal, hilar, or axillary lymph nodes. 3. Occasional small pulmonary nodules unchanged nonspecific. Attention on follow-up. 4. No evidence of metastatic disease in the abdomen or pelvis. 5. Emphysema. 6. Coronary artery disease. Aortic Atherosclerosis (ICD10-I70.0) and Emphysema (ICD10-J43.9). Electronically Signed   By: Marolyn JONETTA Jaksch M.D.   On: 06/30/2024 05:25   VAS US  LOWER EXTREMITY ARTERIAL DUPLEX Result Date: 04/22/2024 LOWER EXTREMITY ARTERIAL DUPLEX STUDY Patient Name:  NIMESH RIOLO  Date of Exam:   04/21/2024 Medical Rec #: [id number]            Accession #:    [id number] Date of Birth: [birth year]           Patient Gender: M Patient Age:   30 years Exam Location:  [facility] Procedure:      VAS US  LOWER EXTREMITY ARTERIAL DUPLEX Referring Phys: [name] --------------------------------------------------------------------------------  Indications: Ulceration, peripheral artery disease, and Routine f/u. High Risk Factors: Hyperlipidemia. Other Factors: ASO s/p multiple interventions and  bypass.                11/15/2019 Right thrombectomy of Fempop insitu graft with added                stent throughout.                 12/01/2019: Aortogram and Selctive Right Lower Extremity                Angiogram. 6 mg of TPA delivered in a lysis catheterfrom the                Origin of the Right Femoral -Popliteal bypass down the proximal                ATA with placement of the lysis.                 12/02/2019: PTA of the Right Posterior Tibial Artery. PTA of the                Right Peroneal Artery. Mechanical Thrombectomy of the Right                Femoral -Popliteal Bypass graft. Mechanical Thrombectomy of the                Right Tibioperoneal Trunk and Posterior Tibial Artery.                 09/25/2021: Right Lower Extremity Angiography third order                 catheter placement. PTA of the proximal peroneal and                tibioperoneal trunk to 2.5 mm with an ultra score balloon. PTA of                the Femoral Artery and the proximal anstomosis of the bypass                graft to 4 mm with an ultra score balloon. Administration of 5 mg                of tpa throughout the entire bypass and in the peroneal Artery.                Initiation of continuous tpa infusion Right lower Extremity for                limb salvage. Insertion of a triple lumen catheter Left CFV with                US  and fluoroscopic guidance.                 09/26/2021: Right LE Angiogram. PTA of the Right Posterior Tibial                Artery with 2.5 mm diameter by 30 cm length angioplasty balloon.                PTA of the distal bypass anastomosis and distal bypass graft                analogous to the Popliteal artery with 4 mm diameter by 8 cm                length Lutonix drug coated angioplasty balloon.                04/08/2022 right thrombectomy of the Deep  profunda.  Vascular Interventions: 03/25/2024 Right lower extremity angiography                         3. Atherectomy right common femoral artery,                         femoral-popliteal bypass and distal popliteal arteries                         with the Rota Rex catheter                         4. Ultrasound-guided access to the left common femoral                         artery                         5. Mechanical thrombectomy using the penumbra lightening                         bolt 7 catheter                         6. Percutaneous kissing stents in the profunda femoris                         and origin of the femoral-popliteal bypass with a 9 x 40                         life star stent in the profunda and a 7 x 100 life stent                         in the femoral-popliteal bypass                         7. Percutaneous angioplasty and stent placement distal                         bypass and popliteal artery with a  6 mm x 40 mm life                         stent.                         8. Percutaneous transluminal angioplasty and stent                         placement right tibioperoneal trunk and posterior tibial                         artery using 3 Esprit stents postdilated to 4 mm                         proximally and 3 mm distally.                          Hx of multiple right lower extremity PTA's/stents.  09/10/2016 Right femoral to below the knee popliteal                         artery bypass graft placement. 5/01 Right bypass graft &                         mid popliteal artery PTA & coil embolectomy of large                         branch of bypass graft. 12/09/16 Right distal bypass                         graft PTA/stent and right posterior tibial artery PTA.                         03/23/2020 Rt PTA thrombectomy. Rt Profunda fem artery to                         PTA bypass. Current ABI:            rt = 1.13 Performing Technologist: Jerel Croak RVT  Examination Guidelines: A complete evaluation includes B-mode imaging, spectral Doppler, color Doppler, and power Doppler as needed of all accessible portions of each vessel. Bilateral testing is considered an integral part of a complete examination. Limited examinations for reoccurring indications may be performed as noted.  +-----------+--------+-----+--------+----------+--------+ RIGHT      PSV cm/sRatioStenosisWaveform  Comments +-----------+--------+-----+--------+----------+--------+ CFA Mid    54                   biphasic           +-----------+--------+-----+--------+----------+--------+ DFA        189                  triphasic          +-----------+--------+-----+--------+----------+--------+ SFA Prox                                  BPG      +-----------+--------+-----+--------+----------+--------+ SFA Mid                                   BPG       +-----------+--------+-----+--------+----------+--------+ SFA Distal                                BPG      +-----------+--------+-----+--------+----------+--------+ POP Distal 38                   biphasic           +-----------+--------+-----+--------+----------+--------+ ATA Distal 19                   monophasic         +-----------+--------+-----+--------+----------+--------+ PTA Distal 48                   triphasic          +-----------+--------+-----+--------+----------+--------+ PERO Distal19                   monophasic         +-----------+--------+-----+--------+----------+--------+  Right Graft #1: FemPop +------------------+--------+--------+---------+--------+                   PSV cm/sStenosisWaveform Comments +------------------+--------+--------+---------+--------+ Inflow            54              biphasic          +------------------+--------+--------+---------+--------+ Prox Anastomosis  52              biphasic          +------------------+--------+--------+---------+--------+ Proximal Graft    64              biphasic          +------------------+--------+--------+---------+--------+ Mid Graft         63              triphasic         +------------------+--------+--------+---------+--------+ Distal Graft      74              biphasic          +------------------+--------+--------+---------+--------+ Distal Anastomosis58              triphasic         +------------------+--------+--------+---------+--------+ Outflow           38              biphasic          +------------------+--------+--------+---------+--------+   Summary: Right: Patent FemPop BPG and new stents throughout. Strong triphasic flow to distal PTA. NL abi  See table(s) above for measurements and observations. Electronically signed by [name] MD on 04/22/2024 at 9:35:02 AM.    Final    VAS US  ABI WITH/WO TBI Result Date: 04/22/2024   LOWER EXTREMITY DOPPLER STUDY Patient Name:  TAISHAWN SMALDONE  Date of Exam:   04/21/2024 Medical Rec #: [id number]            Accession #:    [id number] Date of Birth: [birth year]           Patient Gender: M Patient Age:   38 years Exam Location:  [facility] Procedure:      VAS US  ABI WITH/WO TBI Referring Phys: [facility] --------------------------------------------------------------------------------  Indications: Ulceration, peripheral artery disease, and Routine f/u. High Risk Factors: Hyperlipidemia. Other Factors: ASO s/p multiple interventions and bypass.                11/15/2019 Right thrombectomy of Fempop insitu graft with added                stent throughout.                 12/01/2019: Aortogram and Selctive Right Lower Extremity                Angiogram. 6 mg of TPA delivered in a lysis catheterfrom the                Origin of the Right Femoral -Popliteal bypass down the proximal                ATA with placement of the lysis.                 12/02/2019: PTA of the Right Posterior Tibial Artery. PTA of the                Right Peroneal Artery. Mechanical Thrombectomy of the Right  Femoral -Popliteal Bypass graft. Mechanical Thrombectomy of the                Right Tibioperoneal Trunk and Posterior Tibial Artery.                 09/25/2021: Right Lower Extremity Angiography third order                catheter placement. PTA of the proximal peroneal and                tibioperoneal trunk to 2.5 mm with an ultra score balloon. PTA of                the Femoral Artery and the proximal anstomosis of the bypass                graft to 4 mm with an ultra score balloon. Administration of 5 mg                of tpa throughout the entire bypass and in the peroneal Artery.                Initiation of continuous tpa infusion Right lower Extremity for                limb salvage. Insertion of a triple lumen catheter Left CFV with                US  and fluoroscopic guidance.                  09/26/2021: Right LE Angiogram. PTA of the Right Posterior Tibial                Artery with 2.5 mm diameter by 30 cm length angioplasty balloon.                PTA of the distal bypass anastomosis and distal bypass graft                analogous to the Popliteal artery with 4 mm diameter by 8 cm                length Lutonix drug coated angioplasty balloon.                04/08/2022 right thrombectomy of the Deep profunda.  Vascular Interventions: 03/25/2024 Right lower extremity angiography                         3. Atherectomy right common femoral artery,                         femoral-popliteal bypass and distal popliteal arteries                         with the Rota Rex catheter                         4. Ultrasound-guided access to the left common femoral                         artery                         5. Mechanical thrombectomy using the penumbra lightening  bolt 7 catheter                         6. Percutaneous kissing stents in the profunda femoris                         and origin of the femoral-popliteal bypass with a 9 x 40                         life star stent in the profunda and a 7 x 100 life stent                         in the femoral-popliteal bypass                         7. Percutaneous angioplasty and stent placement distal                         bypass and popliteal artery with a 6 mm x 40 mm life                         stent.                         8. Percutaneous transluminal angioplasty and stent                         placement right tibioperoneal trunk and posterior tibial                         artery using 3 Esprit stents postdilated to 4 mm                         proximally and 3 mm distally.                          Hx of multiple right lower extremity PTA's/stents.                         09/10/2016 Right femoral to below the knee popliteal                         artery bypass graft placement. 5/01 Right bypass graft &                          mid popliteal artery PTA & coil embolectomy of large                         branch of bypass graft. 12/09/16 Right distal bypass                         graft PTA/stent and right posterior tibial artery PTA.                         03/23/2020 Rt PTA thrombectomy. Rt Profunda fem artery to  PTA bypass. Performing Technologist: Jerel Croak RVT  Examination Guidelines: A complete evaluation includes at minimum, Doppler waveform signals and systolic blood pressure reading at the level of bilateral brachial, anterior tibial, and posterior tibial arteries, when vessel segments are accessible. Bilateral testing is considered an integral part of a complete examination. Photoelectric Plethysmograph (PPG) waveforms and toe systolic pressure readings are included as required and additional duplex testing as needed. Limited examinations for reoccurring indications may be performed as noted.  ABI Findings: +---------+------------------+-----+--------+--------+ Right    Rt Pressure (mmHg)IndexWaveformComment  +---------+------------------+-----+--------+--------+ Brachial 128                                     +---------+------------------+-----+--------+--------+ PTA      140               1.08 biphasic         +---------+------------------+-----+--------+--------+ DP       147               1.13 biphasic         +---------+------------------+-----+--------+--------+ Great Toe145               1.12 Normal           +---------+------------------+-----+--------+--------+ +---------+------------------+-----+---------+-------+ Left     Lt Pressure (mmHg)IndexWaveform Comment +---------+------------------+-----+---------+-------+ Brachial 130                                     +---------+------------------+-----+---------+-------+ PTA      145               1.12 triphasic        +---------+------------------+-----+---------+-------+ DP       144                1.11 triphasic        +---------+------------------+-----+---------+-------+ Great Toe152               1.17 Normal           +---------+------------------+-----+---------+-------+ +-------+-----------+-----------+------------+------------+ ABI/TBIToday's ABIToday's TBIPrevious ABIPrevious TBI +-------+-----------+-----------+------------+------------+ Right  1.13       1.12       .37         .26          +-------+-----------+-----------+------------+------------+ Left   1.12       1.17       1.13        .75          +-------+-----------+-----------+------------+------------+  Right TBIs and ABIs appear increased compared to prior study on 02/2024.  Summary: Right: Resting right ankle-brachial index is within normal range. The right toe-brachial index is normal.  Left: Resting left ankle-brachial index is within normal range. The left toe-brachial index is normal.  *See table(s) above for measurements and observations.  Electronically signed by [name] MD on 04/22/2024 at 9:34:54 AM.    Final    MR BRAIN W WO CONTRAST Result Date: 04/11/2024 CLINICAL DATA:  Brain/CNS neoplasm, assess treatment response EXAM: MRI HEAD WITHOUT AND WITH CONTRAST TECHNIQUE: Multiplanar, multiecho pulse sequences of the brain and surrounding structures were obtained without and with intravenous contrast. CONTRAST:  9 mL of Vueway  COMPARISON:  MRI head January 04, 2024. FINDINGS: Brain: Slight increase in size of a centrally necrotic 9 mm lesion in the right occipital lobe. Similar versus slightly decreased necrotic left  parietal lesion which measures approximately 2.1 x 1.9 cm. Similar versus slightly improved/decreased 1.1 x 1.1 cm lesion in the left frontal operculum. Unchanged punctate focus of enhancement in the left cerebellum (series 14, image 42). Edema surrounding these lesions is not substantially changed. No progressive mass effect. No midline shift. No evidence of acute infarct, acute  hemorrhage, or hydrocephalus. Vascular: Normal flow voids. Skull and upper cervical spine: Normal marrow signal. Sinuses/Orbits: Clear sinuses.  No acute findings. IMPRESSION: 1. 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 This could represent treatment related change or progression. Recommend attention on follow-up. 2. 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 3. Unchanged punctate lesion in the left cerebellum, most likely a vessel but warranting attention on follow-up. 4. 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 Recommend ENT follow-up if not previously evaluated. Electronically Signed   By: Gilmore GORMAN Molt M.D.   On: 04/11/2024 13:00

## 2024-07-04 NOTE — Assessment & Plan Note (Signed)
 Follow up with [name].  Continue keppra 

## 2024-07-04 NOTE — Patient Instructions (Signed)
 [facility] - A DEPT OF [facility]  Discharge Instructions: Thank you for choosing [facility] to provide your oncology and hematology care.  If you have a lab appointment with the Cancer Center, please go directly to the Cancer Center and check in at the registration area.  Wear comfortable clothing and clothing appropriate for easy access to any Portacath or PICC line.   We strive to give you quality time with your provider. You may need to reschedule your appointment if you arrive late (15 or more minutes).  Arriving late affects you and other patients whose appointments are after yours.  Also, if you miss three or more appointments without notifying the office, you may be dismissed from the clinic at the provider's discretion.      For prescription refill requests, have your pharmacy contact our office and allow 72 hours for refills to be completed.    Today you received the following chemotherapy and/or immunotherapy agents Imfinzi      To help prevent nausea and vomiting after your treatment, we encourage you to take your nausea medication as directed.  BELOW ARE SYMPTOMS THAT SHOULD BE REPORTED IMMEDIATELY: *FEVER GREATER THAN 100.4 F (38 C) OR HIGHER *CHILLS OR SWEATING *NAUSEA AND VOMITING THAT IS NOT CONTROLLED WITH YOUR NAUSEA MEDICATION *UNUSUAL SHORTNESS OF BREATH *UNUSUAL BRUISING OR BLEEDING *URINARY PROBLEMS (pain or burning when urinating, or frequent urination) *BOWEL PROBLEMS (unusual diarrhea, constipation, pain near the anus) TENDERNESS IN MOUTH AND THROAT WITH OR WITHOUT PRESENCE OF ULCERS (sore throat, sores in mouth, or a toothache) UNUSUAL RASH, SWELLING OR PAIN  UNUSUAL VAGINAL DISCHARGE OR ITCHING   Items with * indicate a potential emergency and should be followed up as soon as possible or go to the Emergency Department if any problems should occur.  Please show the CHEMOTHERAPY ALERT CARD or IMMUNOTHERAPY  ALERT CARD at check-in to the Emergency Department and triage nurse.  Should you have questions after your visit or need to cancel or reschedule your appointment, please contact [facility] - A DEPT OF [facility]  [phone] and follow the prompts.  Office hours are 8:00 a.m. to 4:30 p.m. Monday - Friday. Please note that voicemails left after 4:00 p.m. may not be returned until the following business day.  We are closed weekends and major holidays. You have access to a nurse at all times for urgent questions. Please call the main number to the clinic [phone] and follow the prompts.  For any non-urgent questions, you may also contact your provider using MyChart. We now offer e-Visits for anyone 1 and older to request care online for non-urgent symptoms. For details visit [email or web address].   Also download the MyChart app! Go to the app store, search "MyChart", open the app, select [place], and log in with your MyChart username and password.

## 2024-07-04 NOTE — Assessment & Plan Note (Signed)
 continue oral potassium 20meq daily, monitor

## 2024-07-04 NOTE — Assessment & Plan Note (Addendum)
 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  Labs are reviewed and discussed with patient. Proceed with Durvalumab   CT scan showed NED.

## 2024-07-05 ENCOUNTER — Ambulatory Visit: Payer: Self-pay | Admitting: Oncology

## 2024-07-06 NOTE — Progress Notes (Signed)
 Spoke to pt and informed her of MD recommendation to start oral iron supplement. Mychart message also sent.

## 2024-07-07 ENCOUNTER — Other Ambulatory Visit: Payer: Self-pay

## 2024-07-07 MED ORDER — AMOXICILLIN-POT CLAVULANATE 875-125 MG PO TABS
1.0000 | ORAL_TABLET | Freq: Two times a day (BID) | ORAL | 0 refills | Status: DC
  Filled 2024-07-07: qty 28, 14d supply, fill #0

## 2024-07-07 MED ORDER — PREDNISONE 10 MG PO TABS
ORAL_TABLET | ORAL | 0 refills | Status: AC
  Filled 2024-07-07: qty 21, 6d supply, fill #0

## 2024-07-13 ENCOUNTER — Other Ambulatory Visit: Payer: Self-pay

## 2024-07-22 ENCOUNTER — Other Ambulatory Visit: Payer: Self-pay

## 2024-07-22 ENCOUNTER — Ambulatory Visit
Admission: RE | Admit: 2024-07-22 | Discharge: 2024-07-22 | Disposition: A | Discharge status: Home / Self Care | Payer: Medicare (Managed Care) | Source: Ambulatory Visit | Attending: Internal Medicine | Admitting: Internal Medicine

## 2024-07-22 ENCOUNTER — Encounter: Payer: Self-pay | Admitting: Oncology

## 2024-07-22 DIAGNOSIS — C7931 Secondary malignant neoplasm of brain: Secondary | ICD-10-CM | POA: Insufficient documentation

## 2024-07-22 MED ORDER — GADOBUTROL 1 MMOL/ML IV SOLN
8.0000 mL | Freq: Once | INTRAVENOUS | Status: AC | PRN
  Administered 2024-07-22: 8 mL via INTRAVENOUS

## 2024-07-25 ENCOUNTER — Inpatient Hospital Stay: Payer: Medicare (Managed Care) | Attending: Oncology

## 2024-07-25 ENCOUNTER — Inpatient Hospital Stay: Payer: Medicare (Managed Care) | Admitting: Oncology

## 2024-07-25 ENCOUNTER — Encounter: Payer: Self-pay | Admitting: Oncology

## 2024-07-25 ENCOUNTER — Inpatient Hospital Stay: Payer: Self-pay

## 2024-07-25 ENCOUNTER — Inpatient Hospital Stay: Payer: Medicare (Managed Care)

## 2024-07-25 VITALS — BP 128/69 | HR 74

## 2024-07-25 VITALS — BP 132/78 | HR 82 | Temp 97.2°F | Resp 18 | Wt 180.0 lb

## 2024-07-25 DIAGNOSIS — I739 Peripheral vascular disease, unspecified: Secondary | ICD-10-CM | POA: Insufficient documentation

## 2024-07-25 DIAGNOSIS — K5900 Constipation, unspecified: Secondary | ICD-10-CM | POA: Diagnosis not present

## 2024-07-25 DIAGNOSIS — E032 Hypothyroidism due to medicaments and other exogenous substances: Secondary | ICD-10-CM

## 2024-07-25 DIAGNOSIS — D509 Iron deficiency anemia, unspecified: Secondary | ICD-10-CM | POA: Insufficient documentation

## 2024-07-25 DIAGNOSIS — C3491 Malignant neoplasm of unspecified part of right bronchus or lung: Secondary | ICD-10-CM

## 2024-07-25 DIAGNOSIS — C7931 Secondary malignant neoplasm of brain: Secondary | ICD-10-CM | POA: Diagnosis not present

## 2024-07-25 DIAGNOSIS — Z7982 Long term (current) use of aspirin: Secondary | ICD-10-CM | POA: Insufficient documentation

## 2024-07-25 DIAGNOSIS — C3411 Malignant neoplasm of upper lobe, right bronchus or lung: Secondary | ICD-10-CM | POA: Diagnosis present

## 2024-07-25 DIAGNOSIS — Z79890 Hormone replacement therapy (postmenopausal): Secondary | ICD-10-CM | POA: Insufficient documentation

## 2024-07-25 DIAGNOSIS — Z7902 Long term (current) use of antithrombotics/antiplatelets: Secondary | ICD-10-CM | POA: Insufficient documentation

## 2024-07-25 DIAGNOSIS — E876 Hypokalemia: Secondary | ICD-10-CM | POA: Diagnosis not present

## 2024-07-25 DIAGNOSIS — Z5112 Encounter for antineoplastic immunotherapy: Secondary | ICD-10-CM | POA: Insufficient documentation

## 2024-07-25 LAB — CBC WITH DIFFERENTIAL/PLATELET
Abs Immature Granulocytes: 0.03 K/uL (ref 0.00–0.07)
Basophils Absolute: 0.1 K/uL (ref 0.0–0.1)
Basophils Relative: 1 %
Eosinophils Absolute: 0.4 K/uL (ref 0.0–0.5)
Eosinophils Relative: 6 %
HCT: 40.9 % (ref 39.0–52.0)
Hemoglobin: 12.7 g/dL — ABNORMAL LOW (ref 13.0–17.0)
Immature Granulocytes: 1 %
Lymphocytes Relative: 25 %
Lymphs Abs: 1.6 K/uL (ref 0.7–4.0)
MCH: 24 pg — ABNORMAL LOW (ref 26.0–34.0)
MCHC: 31.1 g/dL (ref 30.0–36.0)
MCV: 77.2 fL — ABNORMAL LOW (ref 80.0–100.0)
Monocytes Absolute: 0.6 K/uL (ref 0.1–1.0)
Monocytes Relative: 8 %
Neutro Abs: 3.9 K/uL (ref 1.7–7.7)
Neutrophils Relative %: 59 %
Platelets: 208 K/uL (ref 150–400)
RBC: 5.3 MIL/uL (ref 4.22–5.81)
RDW: 20.4 % — ABNORMAL HIGH (ref 11.5–15.5)
WBC: 6.7 K/uL (ref 4.0–10.5)
nRBC: 0 % (ref 0.0–0.2)

## 2024-07-25 LAB — COMPREHENSIVE METABOLIC PANEL WITH GFR
ALT: 13 U/L (ref 0–44)
AST: 21 U/L (ref 15–41)
Albumin: 4.3 g/dL (ref 3.5–5.0)
Alkaline Phosphatase: 78 U/L (ref 38–126)
Anion gap: 11 (ref 5–15)
BUN: 11 mg/dL (ref 8–23)
CO2: 26 mmol/L (ref 22–32)
Calcium: 9.9 mg/dL (ref 8.9–10.3)
Chloride: 101 mmol/L (ref 98–111)
Creatinine, Ser: 0.99 mg/dL (ref 0.61–1.24)
GFR, Estimated: >60 mL/min
Glucose, Bld: 118 mg/dL — ABNORMAL HIGH (ref 70–99)
Potassium: 4.2 mmol/L (ref 3.5–5.1)
Sodium: 137 mmol/L (ref 135–145)
Total Bilirubin: 0.2 mg/dL (ref 0.0–1.2)
Total Protein: 7.3 g/dL (ref 6.5–8.1)

## 2024-07-25 MED ORDER — SODIUM CHLORIDE 0.9 % IV SOLN
10.0000 mg/kg | Freq: Once | INTRAVENOUS | Status: AC
  Administered 2024-07-25: 860 mg via INTRAVENOUS
  Filled 2024-07-25: qty 7.2

## 2024-07-25 MED ORDER — SODIUM CHLORIDE 0.9 % IV SOLN
Freq: Once | INTRAVENOUS | Status: AC
  Filled 2024-07-25: qty 250

## 2024-07-25 NOTE — Assessment & Plan Note (Signed)
Durvalumab treatment as planned.  

## 2024-07-25 NOTE — Assessment & Plan Note (Addendum)
 Lab Results  Component Value Date   HGB 12.7 (L) 07/25/2024   TIBC 420 07/04/2024   IRONPCTSAT 7 (L) 07/04/2024   FERRITIN 20 (L) 07/04/2024    Hemoglobin has improved since the start of oral iron supplementation.  Continue oral iron supplementation for another month..  Recommend stool softener as needed for constipation Possible GI blood from being on Aspirin  and Plavix 

## 2024-07-25 NOTE — Assessment & Plan Note (Signed)
 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  Labs are reviewed and discussed with patient. Proceed with Durvalumab   CT scan showed NED.

## 2024-07-25 NOTE — Progress Notes (Signed)
 Chronic Hematology/Oncology Progress note [phone] [phone]    CHIEF COMPLAINTS/REASON FOR VISIT:  Follow up for lung cancer  ASSESSMENT & PLAN:   Cancer Staging  Squamous cell carcinoma of right lung (HCC) Staging form: Lung, AJCC 8th Edition - Clinical stage from 09/20/2020: Stage IV (cT1b, cN2, cM1) - Signed by [name], MD on 12/13/2021   Squamous cell carcinoma of right lung (HCC) Durvalumab  was previously held due to pneumonitis.  Labs are reviewed and discussed with patient. Proceed with Durvalumab   CT scan showed NED.    Encounter for antineoplastic immunotherapy Durvalumab  treatment as planned.   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  Continue oral iron supplementation for another month..  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 He is asymptomatic.  December 2025, MRI brain with and without contrast showed improvement. Follow up  with [name].     Follow up  2 weeks lab MD Durvalumab   All questions were answered. The patient knows to call the clinic with any problems, questions or concerns.  Call Babara, MD, PhD [facility] Hematology Oncology 07/25/2024       HISTORY OF PRESENTING ILLNESS:  Joshua [name] is a 69 y.o. male who has above history reviewed by me today presents for follow up visit for Stage IV lung squamous cell carcinoma. Oncology History  Squamous cell carcinoma of right lung (HCC)  09/18/2020 Imaging   PET scan showed right upper lobe nodule maximal SUV  11.5.  Hypermetabolic cluster right paratracheal adenopathy with maximum SUV up to 12.  No findings of metastatic disease to the neck/abdomen/pelvis or skeleton.   09/20/2020 Initial Diagnosis   Squamous cell carcinoma of right lung (HCC) NGS showed PD-L1 TPS 95%, LRP1B S997fs, PIK3CA E545K, RB1 c1422-2A>T, RIT1 T83_ A84del, STK11 P281fs, TPS53 R248W, TMB 19.80mut/mb, MS stable.    09/20/2020 Cancer Staging   Staging form: Lung, AJCC 8th Edition - Clinical stage from 09/20/2020: Stage IV (cT1b, cN2, cM1) - Signed by [name], MD on 12/13/2021   10/03/2020 Imaging   MRI brain with and without contrast showed no evidence of intracranial metastatic disease. Well-defined round lesion within the left parotid tail measuring approximately 2.3 x 1.8 x 1.8 cm   10/09/2020 - 11/27/2020 Radiation Therapy   concurrent chemoradiation   10/10/2020 - 10/31/2020 Chemotherapy   10/10/2020 concurrent chemoradiation.  carboplatin  AUC of 2 and Taxol  45 mg/m2 x 4.  Additional chemotherapy was held due to thrombocytopenia, neutropenia       12/31/2020 - 03/21/2022 Chemotherapy   Maintenance  Durvalumab  q14d      12/31/2020 -  Chemotherapy   Patient is on Treatment Plan : LUNG Durvalumab  (10) q14d     03/28/2021 Imaging   CT without contrast showed new linear right perihilar consolidation with associated traction bronchiectasis.  Likely postradiation changes.  Additional new peribronchial vascular/nodular groundglass opacities with areas of consolidation or seen primarily in the right upper and lower lobes, more distant from expected radiation fields. 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  Per my discussion with pulmonology Dr. 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     05/09/2021 Imaging   Repeat CT chest without contrast showed evolving radiation changes in the right perihilar  region likely accounting for interval increase to thickening of the posterior wall of the right upper lobe bronchus.  The more peripheral right lung groundglass opacities seen on most recent study have generally improved with exception of one peripherally right upper lobe which appears new.  These are likely inflammatory.  No findings highly suspicious for local recurrence.   05/30/2021 - 01/10/2022 Chemotherapy   Durvalumab  q14d     10/07/2021 Imaging   MRI brain with and without contrast showed 2 contrast-enhancing lesions, most consistent with metastatic disease.  Largest lesion is in the left frontal lobe and measures up to 1.3 cm with a large amount of surrounding edema and 9 mm rightward midline shift.  The other enhancing lesion was 4 mm in the right occipital lobe with a small amount of surrounding edema.  No acute or chronic hemorrhage.   10/07/2021 Progression   Headache after colonoscopy, progressive difficulty using her right arm.  Progressively worsening of speech. Stage IV lung cancer with brain metastasis.  started on Dexamethasone .  He was seen by neurosurgery and radiation oncology.  Recommendation is to proceed with SRS.  Patient was discharged with dexamethasone    10/16/2021 Imaging   PET scan showed postradiation changes in the right supra hilar lung without evidence of local recurrence.  No evidence of lung cancer recurrence or metastatic disease.  Single focus of intense metabolic activity associated with posterior left 11th rib likely secondary to recent fall/posttraumatic metabolic changes.  Frontal  uptake in the left frontal lobe corresponding to the known metastatic lesion in the brain   10/29/2021 -  Radiation Therapy   Brain Radiation [facility]   12/10/2021 Imaging   Brain MRI: Continued interval decrease in size of a left frontal lobe metastasis, now measuring 10 mm. Moderate surrounding edema has also decreased.  3-4 mm metastasis within the right occipital lobe, not  significantly changed in size. There is now a small focus of central non enhancement within this lesion. Increased edema. 3 mm enhancing metastasis within the high left parietal lobe. In retrospect, this was likely present as a subtle punctate focus of enhancement on the prior brain MRI of 10/22/2021.   01/18/2022 Imaging   CT chest abdomen pelvis w contrast 1. Stable post treatment changes in the right upper lobe medially with dense radiation fibrosis but no findings to suggest local recurrent tumor. 2. Significant progression of ill-defined ground-glass opacity, interstitial thickening and airspace nodularity in the right upper lobe and upper aspect of the right lower lobe. Findings could reflect an inflammatory or atypical infectious process, drug induced pneumonitis or interstitial spread of tumor.3. No mediastinal or hilar mass or adenopathy. 4. No findings for abdominal/pelvic metastatic disease or osseous metastatic disease.   02/19/2022 Imaging   MRI brain w wo contrast showed Three metastatic deposits in the brain appear smaller. Decreased edema in the left parietal lobe. No new lesions    04/04/2022 Imaging   CT chest w contrast 1. Stable radiation changes in the right perihilar region without evidence of local recurrence. 2. Interval improvement in the patchy ground-glass opacities previously demonstrated in both lungs, most consistent with a resolving infectious or inflammatory process (including immunotherapy-related pneumonitis). 3. No evidence of metastatic disease.  05/14/2022 Miscellaneous   He had a mechanical fall and fractured his left elbow. xrays showed a left elbow fracture  05/26/22 seen by orthopedic surgeon, cast applied.    05/27/2022 Imaging   MRI brain w wo contrast 1. All three known brain metastases have enlarged since August, with increasing regional edema. And there is a small new left anterior cerebellar 4-5 mm metastasis (was questionably punctate on the May  MRI). This constellation could reflect a combination of True Progression (Left Cerebellar lesion) and pseudoprogression/radionecrosis (3 existing mets).    2. No significant intracranial mass effect. 3. Underlying moderate cerebral white matter signal changes.   05/30/2022 Imaging   CT chest abdomen pelvis w contrast  1. 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 There is a stable small right pleural effusion, but no definitive findings to suggest residual or recurrent disease on today's examination. 2. No signs of metastatic disease in the abdomen or pelvis.  3. Aortic atherosclerosis, in addition to two-vessel coronary artery disease. Please note that although the presence of coronary artery calcium  documents the presence of coronary artery disease, the severity of this disease and any potential stenosis cannot be assessed on this non-gated CT examination. Assessment for potential risk factor modification, dietary therapy or pharmacologic therapy may be warranted, if clinically indicated. 4. Hepatic steatosis. 5. Colonic diverticulosis without evidence of acute diverticulitis at this time. 6. Incompletely imaged but apparently occluded vascular graft in the superior aspect of the right anterior thigh. Referral to vascular specialist for further clinical evaluation should be considered if clinically appropriate. 7. Additional incidental findings, as above.   07/22/2022 Imaging   MRI brain  1. No new or enlarging lesions. No intracranial disease progression. 2. Decreased size of treated lesions of the left frontoparietal junction and left postcentral gyrus. 3. Unchanged lesions in the left cerebellum and right occipital lobe.   10/15/2022 Imaging   MRI brain w wo contrast showed Interval increase in size of multiple intraparenchymal lesions with worsened surrounding vasogenic  edema. 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 No new lesions   11/05/2022 Imaging   MRI brain w wo contrast  1. Likely slight increase and right occipital and left cerebellar lesions. Otherwise, unchanged metastases, as above. No new lesions identified. 2. Partially imaged large probable lymph node in the left upper neck, apparent on prior PET CT.   11/13/2022 -  Radiation Therapy   Each of the sites were treated with SRS/SBRT/SRT-IMRT technique to 20 Gy in 1 fraction to each of the following sites: PTV_2_LCerebel_58mm PTV_3_RCerebel_45mm PTV_4_ROccip_30mm PTV_5_LParietal_28mm   11/27/2022 Imaging   CT chest abdomen pelvis w contrast showed  Improving bilateral scattered interstitial and ground-glass opacities. Small amount of residual areas in the extreme left lower lobe inferiorly. This specific area is slightly increased from previous but overall the amount of opacities is significantly improved. Recommend continued follow-up. No developing new mass lesion, fluid collection or lymph node enlargement. Stable posttreatment changes right perihilar in the lung. Fatty liver infiltration. Colonic diverticulosis. Improved tiny right pleural effusion   02/05/2023 Imaging   MRI brain with and without contrast Interval decrease in all of the metastatic lesions, detailed above.No new lesions identified.    Imaging   CT chest abdomen pelvis w contrast showed  1. 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 2. Trace right pleural fluid  or thickening, new or minimally increased. 3. Incidental findings, including: Aortic atherosclerosis (ICD10-I70.0), coronary artery atherosclerosis and emphysema (ICD10-J43.9). Hepatic steatosis.   06/25/2023 Imaging   CT chest abdomen pelvis w contrast showed 1. Unchanged post treatment/post radiation appearance of the  right lung, with perihilar and suprahilar paramedian fibrosis and consolidation. 2. No evidence of lymphadenopathy or metastatic disease in the chest, abdomen, or pelvis. 3. Hepatic steatosis. 4. Coronary artery disease.   Aortic Atherosclerosis (ICD10-I70.0) and Emphysema (ICD10-J43.9).   08/26/2023 [facility] Admission   hospitalization due to was observed speech and jerking movements on right side despite being on dexamethasone . Continuous EEG suggest cortical dysfunction arising from left temporal region.  No seizure or elective form discharges were seen. Patient was discharged on dexamethasone  4 mg, Keppra , lamotrigine .    11/04/2023 -  Chemotherapy   Add Bevacizumab  5mg /kg to Durvalumab .     11/17/2023 - 11/27/2023 Radiation Therapy   Patient received radiation to brain for treatment of recurrence.   02/23/2024 Imaging   CT chest abdomen pelvis w contrast showed  CHEST:   1. Post RIGHT upper lobe resection. 2. No evidence of lung cancer recurrence. 3. No mediastinal adenopathy.   PELVIS:   1. No evidence of metastatic disease in the abdomen pelvis. 2. No skeletal metastasis.     INTERVAL HISTORY Joshua [name] is a 69 y.o. male who has above history reviewed by me today presents for follow up visit for right lung RCC.  chronic SOB,not worse.  Peripheral artery disease,Patient is on aspirin  and Plavix . No new complaints patient takes oral iron supplementation.  Occasional constipation.   Review of Systems  Constitutional:  Positive for fatigue. Negative for appetite change, chills, fever and unexpected weight change.  HENT:   Negative for hearing loss and voice change.   Eyes:  Negative for eye problems and icterus.  Respiratory:  Negative for chest tightness, cough and shortness of breath.   Cardiovascular:  Negative for chest pain and leg swelling.  Gastrointestinal:  Negative for abdominal distention, abdominal pain, nausea and vomiting.  Endocrine: Negative  for hot flashes.  Genitourinary:  Negative for difficulty urinating, dysuria and frequency.   Musculoskeletal:  Positive for gait problem. Negative for arthralgias.  Skin:  Negative for itching and rash.  Neurological:  Positive for gait problem. Negative for extremity weakness, headaches, numbness and speech difficulty.  Hematological:  Negative for adenopathy. Bruises/bleeds easily.  Psychiatric/Behavioral:  Negative for confusion.       MEDICAL HISTORY:  Past Medical History:  Diagnosis Date   Allergy    Cancer (HCC)    lung   COPD (chronic obstructive pulmonary disease) (HCC)    Dyspnea    former smoker. 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   Malignant neoplasm metastatic to brain [facility])    Nausea with vomiting 02/01/2024   Peripheral vascular disease    Pre-diabetes    Sleep apnea     SURGICAL HISTORY: Past Surgical History:  Procedure Laterality Date   ANTERIOR CERVICAL DECOMP/DISCECTOMY FUSION N/A 03/14/2020   Procedure: ANTERIOR CERVICAL DECOMPRESSION/DISCECTOMY FUSION 3 LEVELS C4-7;  Surgeon: Clois Fret, MD;  Location: [facility] ORS;  Service: Neurosurgery;  Laterality: N/A;   BACK SURGERY  2011   CENTRAL LINE INSERTION Right 09/10/2016   Procedure: CENTRAL LINE INSERTION;  Surgeon: Jama Cordella MATSU, MD;  Location: [facility] ORS;  Service: Vascular;  Laterality: Right;   CERVICAL FUSION  C4-c7 on Mar 14, 2020   COLONOSCOPY  2013 ?   COLONOSCOPY N/A 09/09/2021   Procedure: COLONOSCOPY;  Surgeon: Onita Elspeth Sharper, DO;  Location: [facility] ENDOSCOPY;  Service: Gastroenterology;  Laterality: N/A;   CORONARY ANGIOPLASTY     ESOPHAGOGASTRODUODENOSCOPY N/A 09/09/2021   Procedure: ESOPHAGOGASTRODUODENOSCOPY (EGD);  Surgeon: Onita Elspeth Sharper, DO;  Location: [facility] ENDOSCOPY;  Service: Gastroenterology;  Laterality: N/A;   FEMORAL-POPLITEAL BYPASS GRAFT Right 09/10/2016   Procedure: BYPASS GRAFT  FEMORAL-POPLITEAL ARTERY ( BELOW KNEE );  Surgeon: Jama Cordella MATSU, MD;  Location: [facility] ORS;  Service: Vascular;  Laterality: Right;   FEMORAL-TIBIAL BYPASS GRAFT Right 03/23/2020   Procedure: BYPASS GRAFT RIGHT FEMORAL- DISTAL TIBIAL ARTERY WITH DISTAL FLOW GRAFT;  Surgeon: Jama Cordella MATSU, MD;  Location: [facility] ORS;  Service: Vascular;  Laterality: Right;   LEFT HEART CATH AND CORONARY ANGIOGRAPHY Left 05/16/2021   Procedure: LEFT HEART CATH AND CORONARY ANGIOGRAPHY;  Surgeon: Lawyer Bernardino Cough, MD;  Location: [facility] INVASIVE CV LAB;  Service: Cardiovascular;  Laterality: Left;   LOWER EXTREMITY ANGIOGRAPHY Right 11/18/2016   Procedure: Lower Extremity Angiography;  Surgeon: Cordella MATSU Jama, MD;  Location: [facility] INVASIVE CV LAB;  Service: Cardiovascular;  Laterality: Right;   LOWER EXTREMITY ANGIOGRAPHY Right 12/09/2016   Procedure: Lower Extremity Angiography;  Surgeon: Jama Cordella MATSU, MD;  Location: [facility] INVASIVE CV LAB;  Service: Cardiovascular;  Laterality: Right;   LOWER EXTREMITY ANGIOGRAPHY Right 11/15/2019   Procedure: LOWER EXTREMITY ANGIOGRAPHY;  Surgeon: Jama Cordella MATSU, MD;  Location: [facility] INVASIVE CV LAB;  Service: Cardiovascular;  Laterality: Right;   LOWER EXTREMITY ANGIOGRAPHY Right 12/01/2019   Procedure: Lower Extremity Angiography;  Surgeon: Marea Selinda RAMAN, MD;  Location: [facility] INVASIVE CV LAB;  Service: Cardiovascular;  Laterality: Right;   LOWER EXTREMITY ANGIOGRAPHY Right 12/02/2019   Procedure: LOWER EXTREMITY ANGIOGRAPHY;  Surgeon: Jama Cordella MATSU, MD;  Location: [facility] INVASIVE CV LAB;  Service: Cardiovascular;  Laterality: Right;   LOWER EXTREMITY ANGIOGRAPHY Right 03/23/2020   Procedure: Lower Extremity Angiography;  Surgeon: Jama Cordella MATSU, MD;  Location: [facility] INVASIVE CV LAB;  Service: Cardiovascular;  Laterality: Right;   LOWER EXTREMITY ANGIOGRAPHY Right 09/25/2021   Procedure: Lower Extremity Angiography;  Surgeon: Jama Cordella MATSU, MD;  Location: [facility] INVASIVE  CV LAB;  Service: Cardiovascular;  Laterality: Right;   LOWER EXTREMITY ANGIOGRAPHY Right 09/26/2021   Procedure: Lower Extremity Angiography;  Surgeon: Marea Selinda RAMAN, MD;  Location: [facility] INVASIVE CV LAB;  Service: Cardiovascular;  Laterality: Right;   LOWER EXTREMITY ANGIOGRAPHY Right 04/08/2022   Procedure: Lower Extremity Angiography;  Surgeon: Jama Cordella MATSU, MD;  Location: [facility] INVASIVE CV LAB;  Service: Cardiovascular;  Laterality: Right;   LOWER EXTREMITY ANGIOGRAPHY Right 03/04/2024   Procedure: Lower Extremity Angiography;  Surgeon: Jama Cordella MATSU, MD;  Location: [facility] INVASIVE CV LAB;  Service: Cardiovascular;  Laterality: Right;   LOWER EXTREMITY ANGIOGRAPHY Right 03/25/2024   Procedure: Lower Extremity Angiography;  Surgeon: Jama Cordella MATSU, MD;  Location: [facility] INVASIVE CV LAB;  Service: Cardiovascular;  Laterality: Right;   LOWER EXTREMITY INTERVENTION  11/18/2016   Procedure: Lower Extremity Intervention;  Surgeon: Cordella MATSU Jama, MD;  Location: [facility] INVASIVE CV LAB;  Service: Cardiovascular;;   LOWER EXTREMITY INTERVENTION Right 03/04/2024   Procedure: LOWER EXTREMITY INTERVENTION;  Surgeon: Jama Cordella MATSU, MD;  Location: [facility] INVASIVE CV LAB;  Service: Cardiovascular;  Laterality: Right;   PERIPHERAL VASCULAR CATHETERIZATION Right 01/02/2015   Procedure: Lower Extremity Angiography;  Surgeon: Cordella MATSU Jama, MD;  Location: [facility]  INVASIVE CV LAB;  Service: Cardiovascular;  Laterality: Right;   PERIPHERAL VASCULAR CATHETERIZATION Right 06/18/2015   Procedure: Lower Extremity Angiography;  Surgeon: Selinda GORMAN Gu, MD;  Location: [facility] INVASIVE CV LAB;  Service: Cardiovascular;  Laterality: Right;   PERIPHERAL VASCULAR CATHETERIZATION  06/18/2015   Procedure: Lower Extremity Intervention;  Surgeon: Selinda GORMAN Gu, MD;  Location: [facility] INVASIVE CV LAB;  Service: Cardiovascular;;   PERIPHERAL VASCULAR CATHETERIZATION N/A 10/09/2015   Procedure: Abdominal Aortogram w/Lower Extremity;   Surgeon: Cordella KANDICE Shawl, MD;  Location: [facility] INVASIVE CV LAB;  Service: Cardiovascular;  Laterality: N/A;   PERIPHERAL VASCULAR CATHETERIZATION  10/09/2015   Procedure: Lower Extremity Intervention;  Surgeon: Cordella KANDICE Shawl, MD;  Location: [facility] INVASIVE CV LAB;  Service: Cardiovascular;;   PERIPHERAL VASCULAR CATHETERIZATION Right 10/10/2015   Procedure: Lower Extremity Angiography;  Surgeon: Selinda GORMAN Gu, MD;  Location: [facility] INVASIVE CV LAB;  Service: Cardiovascular;  Laterality: Right;   PERIPHERAL VASCULAR CATHETERIZATION Right 10/10/2015   Procedure: Lower Extremity Intervention;  Surgeon: Selinda GORMAN Gu, MD;  Location: [facility] INVASIVE CV LAB;  Service: Cardiovascular;  Laterality: Right;   PERIPHERAL VASCULAR CATHETERIZATION Right 12/03/2015   Procedure: Lower Extremity Angiography;  Surgeon: Selinda GORMAN Gu, MD;  Location: [facility] INVASIVE CV LAB;  Service: Cardiovascular;  Laterality: Right;   PERIPHERAL VASCULAR CATHETERIZATION Right 12/04/2015   Procedure: Lower Extremity Angiography;  Surgeon: Selinda GORMAN Gu, MD;  Location: [facility] INVAS[facility] CV LAB;  Service: Cardiovascular;  Laterality: Right;   PERIPHERAL VASCULAR CATHETERIZATION  12/04/2015   Procedure: Lower Extremity Intervention;  Surgeon: Selinda GORMAN Gu, MD;  Location: [facility] INVASIVE CV LAB;  Service: Cardiovascular;;   PERIPHERAL VASCULAR CATHETERIZATION Right 06/30/2016   Procedure: Lower Extremity Angiography;  Surgeon: Selinda GORMAN Gu, MD;  Location: [facility] INVASIVE CV LAB;  Service: Cardiovascular;  Laterality: Right;   PERIPHERAL VASCULAR CATHETERIZATION Right 06/25/2016   Procedure: Lower Extremity Angiography;  Surgeon: Cordella KANDICE Shawl, MD;  Location: [facility] INVASIVE CV LAB;  Service: Cardiovascular;  Laterality: Right;   PERIPHERAL VASCULAR CATHETERIZATION Right 07/01/2016   Procedure: Lower Extremity Angiography;  Surgeon: Cordella KANDICE Shawl, MD;  Location: [facility] INVASIVE CV LAB;  Service: Cardiovascular;  Laterality: Right;   PERIPHERAL VASCULAR  CATHETERIZATION  07/01/2016   Procedure: Lower Extremity Intervention;  Surgeon: Cordella KANDICE Shawl, MD;  Location: [facility] INVASIVE CV LAB;  Service: Cardiovascular;;   PERIPHERAL VASCULAR CATHETERIZATION Right 08/01/2016   Procedure: Lower Extremity Angiography;  Surgeon: Cordella KANDICE Shawl, MD;  Location: [facility] INVASIVE CV LAB;  Service: Cardiovascular;  Laterality: Right;   PORTA CATH INSERTION N/A 10/05/2020   Procedure: PORTA CATH INSERTION;  Surgeon: Shawl Cordella KANDICE, MD;  Location: [facility] INVASIVE CV LAB;  Service: Cardiovascular;  Laterality: N/A;   stent placement in right leg Right    VIDEO BRONCHOSCOPY N/A 09/14/2020   Procedure: VIDEO BRONCHOSCOPY WITH FLUORO;  Surgeon: Tamea Dedra CROME, MD;  Location: [facility] ORS;  Service: Cardiopulmonary;  Laterality: N/A;   VIDEO BRONCHOSCOPY WITH ENDOBRONCHIAL ULTRASOUND N/A 09/14/2020   Procedure: VIDEO BRONCHOSCOPY WITH ENDOBRONCHIAL ULTRASOUND;  Surgeon: Tamea Dedra CROME, MD;  Location: [facility] ORS;  Service: Cardiopulmonary;  Laterality: N/A;    SOCIAL HISTORY: Social History   Socioeconomic History   Marital status: Single    Spouse name: Not on file   Number of children: Not on file   Years of education: Not on file   Highest education level: Not on file  Occupational History   Occupation: unemployed  Tobacco Use   Smoking status: Former  Current packs/day: 0.00    Average packs/day: 2.0 packs/day for 38.0 years (76.0 ttl pk-yrs)    Types: Cigarettes    Start date: 12/19/1976    Quit date: 12/20/2014    Years since quitting: 9.6   Smokeless tobacco: Former   Tobacco comments:    quit smoking in 2017 most smoked 2   Vaping Use   Vaping status: Never Used  Substance and Sexual Activity   Alcohol  use: Not Currently   Drug use: No   Sexual activity: Yes  Other Topics Concern   Not on file  Social History Narrative   Not on file   Social Drivers of Health   Tobacco Use: Medium Risk (07/25/2024)   Patient History    Smoking Tobacco  Use: Former    Smokeless Tobacco Use: Former    Passive Exposure: Not on Actuary Strain: Medium Risk (02/05/2024)   Overall Financial Resource Strain (CARDIA)    Difficulty of Paying Living Expenses: Somewhat hard  Food Insecurity: No Food Insecurity (03/25/2024)   Epic    Worried About Radiation Protection Practitioner of Food in the Last Year: Never true    Ran Out of Food in the Last Year: Never true  Transportation Needs: No Transportation Needs (03/25/2024)   Epic    Lack of Transportation (Medical): No    Lack of Transportation (Non-Medical): No  Physical Activity: Inactive (02/05/2024)   Exercise Vital Sign    Days of Exercise per Week: 1 day    Minutes of Exercise per Session: 0 min  Stress: No Stress Concern Present (02/05/2024)   [employer] of [employer]ational [employer] - Occupational Stress Questionnaire    Feeling of Stress: Only a little  Social Connections: Socially Isolated (03/25/2024)   Social Connection and Isolation Panel    Frequency of Communication with Friends and Family: Three times a week    Frequency of Social Gatherings with Friends and Family: Once a week    Attends Religious Services: Never    Database Administrator or Organizations: No    Attends Banker Meetings: Never    Marital Status: Never married  Intimate Partner Violence: Not At Risk (03/25/2024)   Epic    Fear of Current or Ex-Partner: No    Emotionally Abused: No    Physically Abused: No    Sexually Abused: No  Depression (PHQ2-9): Low Risk (07/25/2024)   Depression (PHQ2-9)    PHQ-2 Score: 0  Alcohol  Screen: Not on file  Housing: Low Risk (03/25/2024)   Epic    Unable to Pay for Housing in the Last Year: No    Number of Times Moved in the Last Year: 0    Homeless in the Last Year: No  Utilities: Not At Risk (03/25/2024)   Epic    Threatened with loss of utilities: No  Health Literacy: Not on file    FAMILY HISTORY: Family History  Problem Relation Age of Onset   Diabetes Mother     Hypertension Mother    Heart murmur Mother    Leukemia Mother    Throat cancer Maternal Grandmother    Colon cancer Neg Hx    Breast cancer Neg Hx     ALLERGIES:  has no known allergies.  MEDICATIONS:  Current Outpatient Medications  Medication Sig Dispense Refill   aspirin  EC 81 MG tablet Take 81 mg by mouth daily. Swallow whole.     clopidogrel  (PLAVIX ) 75 MG tablet Take 1 tablet (75 mg total) by  mouth daily. 100 tablet 2   Cyanocobalamin (B-12) 1000 MCG CHEW Chew 1 tablet by mouth daily at 6 (six) AM.     Ferrous Sulfate 50 MG TBCR Take 1 tablet by mouth daily at 6 (six) AM.     levETIRAcetam  (KEPPRA ) 1000 MG tablet Take 1 tablet (1,000 mg total) by mouth 2 (two) times daily. 60 tablet 2   levothyroxine  (SYNTHROID ) 125 MCG tablet Take 1 tablet (125 mcg total) by mouth daily before breakfast. 90 tablet 0   potassium chloride  SA (KLOR-CON  M) 20 MEQ tablet Take 1 tablet (20 mEq total) by mouth daily. 30 tablet 2   simvastatin  (ZOCOR ) 40 MG tablet Take 1 tablet (40 mg total) by mouth daily. 30 tablet 0   No current facility-administered medications for this visit.   Facility-Administered Medications Ordered in Other Visits  Medication Dose Route Frequency Provider Last Rate Last Admin   albuterol  (PROVENTIL ) (2.5 MG/3ML) 0.083% nebulizer solution 2.5 mg  2.5 mg Nebulization Once Tamea Dedra CROME, MD       heparin  lock flush 100 unit/mL  500 Units Intracatheter Once PRN [name], MD         PHYSICAL EXAMINATION: ECOG PERFORMANCE STATUS: 1 - Symptomatic but completely ambulatory Vitals:   07/25/24 0935  BP: 132/78  Pulse: 82  Resp: 18  Temp: (!) 97.2 F (36.2 C)  SpO2: 99%    Filed Weights   07/25/24 0935  Weight: 180 lb (81.6 kg)     Physical Exam Constitutional:      General: He is not in acute distress.    Appearance: He is obese.  HENT:     Head: Normocephalic and atraumatic.  Eyes:     General: No scleral icterus. Cardiovascular:     Rate and Rhythm:  Normal rate and regular rhythm.  Pulmonary:     Effort: Pulmonary effort is normal. No respiratory distress.     Breath sounds: Normal breath sounds. No wheezing.  Abdominal:     General: There is no distension.     Palpations: Abdomen is soft.     Tenderness: There is no abdominal tenderness.  Musculoskeletal:     Cervical back: Normal range of motion and neck supple.     Comments: Right lower extremity wrapped.   Skin:    General: Skin is warm and dry.     Findings: Bruising present. No erythema.  Neurological:     Mental Status: He is alert and oriented to person, place, and time. Mental status is at baseline.     Cranial Nerves: No cranial nerve deficit.     Motor: No abnormal muscle tone.  Psychiatric:        Mood and Affect: Mood and affect normal.         LABORATORY DATA:  I have reviewed the data as listed    Latest Ref Rng & Units 07/25/2024    8:56 AM 07/04/2024   10:33 AM 06/20/2024   10:57 AM  CBC  WBC 4.0 - 10.5 K/uL 6.7  8.1  7.0   Hemoglobin 13.0 - 17.0 g/dL 87.2  88.6  88.2   Hematocrit 39.0 - 52.0 % 40.9  36.1  37.6   Platelets 150 - 400 K/uL 208  181  231       Latest Ref Rng & Units 07/25/2024    8:56 AM 07/04/2024   10:33 AM 06/20/2024   10:57 AM  CMP  Glucose 70 - 99 mg/dL 881  872  884  BUN 8 - 23 mg/dL 11  8  12    Creatinine 0.61 - 1.24 mg/dL 9.00  9.05  8.96   Sodium 135 - 145 mmol/L 137  139  139   Potassium 3.5 - 5.1 mmol/L 4.2  3.8  3.9   Chloride 98 - 111 mmol/L 101  104  105   CO2 22 - 32 mmol/L 26  23  24    Calcium  8.9 - 10.3 mg/dL 9.9  9.3  8.8   Total Protein 6.5 - 8.1 g/dL 7.3  6.9  6.9   Total Bilirubin 0.0 - 1.2 mg/dL 0.2  0.4  0.7   Alkaline Phos 38 - 126 U/L 78  85  74   AST 15 - 41 U/L 21  21  26    ALT 0 - 44 U/L 13  12  16       RADIOGRAPHIC STUDIES: I have personally reviewed the radiological images as listed and agreed with the findings in the report. MR BRAIN W WO CONTRAST Result Date: 07/22/2024 EXAM: MRI BRAIN WITH  AND WITHOUT CONTRAST 07/22/2024 11:27:19 AM TECHNIQUE: Multiplanar multisequence MRI of the head/brain was performed with and without the administration of intravenous contrast (8 mL gadobutrol  (GADAVIST ) 1 MMOL/ML injection 8 mL GADOBUTROL  1 MMOL/ML IV SOLN). COMPARISON: MRI of the head dated 04/11/2024. CLINICAL HISTORY: Brain/CNS neoplasm, assess treatment response. FINDINGS: BRAIN AND VENTRICLES: There has been marginal interval improvement of metastatic disease to the brain. An enhancing nodular lesion previously noted within the left cerebellar hemisphere has decreased in size and enhancement, measuring approximately 1 to 2 mm in diameter. It is seen on image 38 of series 16. A 9 mm peripherally enhancing lesion within the right occipital lobe is again noted on image 55, also demonstrating less enhancement than on the previous study. A lesion previously described within the left frontal lobe has slightly decreased in size from 11 x 11 mm to 10 x 8 mm. It also demonstrates significantly less enhancement on the current study. A cystic, peripherally enhancing lesion within the left parietal lobe appears similar to the prior study, measuring approximately 22 x 17 mm. It is seen on image 147. There are no new or enlarging lesions present. There is age-related atrophy and moderately advanced cerebral white matter disease. No acute infarct. No acute intracranial hemorrhage. No mass effect or midline shift. No hydrocephalus. The sella is unremarkable. Normal flow voids. ORBITS: No acute abnormality. SINUSES: No acute abnormality. BONES AND SOFT TISSUES: Normal bone marrow signal and enhancement. An ovoid nodule is again noted within the left parotid gland, measuring approximately 2.3 x 1.9 x 2.2 cm. There is also an adjacent submandibular nodule measuring approximately 2.2 x 1.7 x 2.1 cm, which appears to have developed in the interim. The lesions remain concerning for neoplasm with potential metastatic cervical  lymphadenopathy. IMPRESSION: 1. 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 No new or enlarging lesions. 2. Stable cystic, peripherally enhancing lesion within the left parietal lobe. 3. Left parotid nodule again noted, with new/enlarged adjacent left submandibular nodule, concerning for neoplasm with possible metastatic cervical lymphadenopathy. 4. Age-related atrophy and moderately advanced cerebral white matter disease. Electronically signed by: Evalene Coho MD 07/22/2024 12:25 PM EST RP Workstation: [id number]   CT CHEST ABDOMEN PELVIS W CONTRAST Result Date: 06/30/2024 CLINICAL DATA:  Lung cancer restaging * Tracking Code: BO * EXAM: CT CHEST, ABDOMEN, AND PELVIS WITH  CONTRAST TECHNIQUE: Multidetector CT imaging of the chest, abdomen and pelvis was performed following the standard protocol during bolus administration of intravenous contrast. RADIATION DOSE REDUCTION: This exam was performed according to the departmental dose-optimization program which includes automated exposure control, adjustment of the mA and/or kV according to patient size and/or use of iterative reconstruction technique. CONTRAST:  OMNIPAQUE  IOHEXOL  300 MG/ML  SOLN COMPARISON:  02/23/2024 FINDINGS: CT CHEST FINDINGS Cardiovascular: Right chest port catheter. Scattered aortic atherosclerosis. Normal heart size. Scattered left and right coronary artery calcifications. No pericardial effusion. Mediastinum/Nodes: Unchanged treated soft tissue about the right hilum (series 2, image 28). No discretely enlarged mediastinal, hilar, or axillary lymph nodes. Thyroid  gland, trachea, and esophagus demonstrate no significant findings. Lungs/Pleura: Unchanged post treatment/post radiation appearance of the right chest with dense perihilar and suprahilar consolidation and fibrosis. Mild  underlying centrilobular emphysema. Occasional small pulmonary nodules unchanged, for example a 0.4 cm nodule of the dependent right lower lobe (series 4, image 71). No pleural effusion or pneumothorax. Musculoskeletal: No chest wall abnormality. No acute osseous findings. CT ABDOMEN PELVIS FINDINGS Hepatobiliary: No solid liver abnormality is seen. No gallstones, gallbladder wall thickening, or biliary dilatation. Pancreas: Unremarkable. No pancreatic ductal dilatation or surrounding inflammatory changes. Spleen: Normal in size without significant abnormality. Adrenals/Urinary Tract: Adrenal glands are unremarkable. Kidneys are normal, without renal calculi, solid lesion, or hydronephrosis. Bladder is unremarkable. Stomach/Bowel: Stomach is within normal limits. Appendix appears normal. No evidence of bowel wall thickening, distention, or inflammatory changes. Sigmoid diverticulosis. Vascular/Lymphatic: Aortic atherosclerosis. Partially imaged right femoral bypass grafting (series 2, image 135). No enlarged abdominal or pelvic lymph nodes. Reproductive: No mass or other abnormality. Other: No abdominal wall hernia or abnormality. No ascites. Musculoskeletal: No acute osseous findings. IMPRESSION: 1. Unchanged post treatment/post radiation appearance of the right chest with dense perihilar and suprahilar consolidation and fibrosis. 2. Unchanged treated soft tissue about the right hilum. No discretely enlarged mediastinal, hilar, or axillary lymph nodes. 3. Occasional small pulmonary nodules unchanged nonspecific. Attention on follow-up. 4. No evidence of metastatic disease in the abdomen or pelvis. 5. Emphysema. 6. Coronary artery disease. Aortic Atherosclerosis (ICD10-I70.0) and Emphysema (ICD10-J43.9). Electronically Signed   By: Marolyn JONETTA Jaksch M.D.   On: 06/30/2024 05:25

## 2024-07-25 NOTE — Assessment & Plan Note (Signed)
 Off bevacizumab  04/11/2024 MRI brain w wo contrast results were reviewed. He is asymptomatic.  December 2025, MRI brain with and without contrast showed improvement. Follow up  with [name].

## 2024-07-25 NOTE — Assessment & Plan Note (Signed)
 continue levothyroxine 125 mcg daily

## 2024-07-29 ENCOUNTER — Inpatient Hospital Stay: Payer: Medicare (Managed Care) | Admitting: Internal Medicine

## 2024-07-29 ENCOUNTER — Other Ambulatory Visit: Payer: Self-pay

## 2024-07-29 ENCOUNTER — Other Ambulatory Visit: Payer: Self-pay | Admitting: Unknown Physician Specialty

## 2024-07-29 ENCOUNTER — Telehealth: Payer: Self-pay | Admitting: Internal Medicine

## 2024-07-29 DIAGNOSIS — C7931 Secondary malignant neoplasm of brain: Secondary | ICD-10-CM

## 2024-07-29 DIAGNOSIS — D37030 Neoplasm of uncertain behavior of the parotid salivary glands: Secondary | ICD-10-CM

## 2024-07-29 MED ORDER — LEVETIRACETAM 1000 MG PO TABS
1000.0000 mg | ORAL_TABLET | Freq: Two times a day (BID) | ORAL | 1 refills | Status: AC
  Filled 2024-07-29: qty 180, 90d supply, fill #0

## 2024-07-29 NOTE — Telephone Encounter (Signed)
 Pt called to confirm the change of the appt to telephone visit - LH

## 2024-07-29 NOTE — Progress Notes (Signed)
 I connected with Joshua [name] on 07/29/2024 at 11:30 AM EST by telephone visit and verified that I am speaking with the correct person using two identifiers.  I discussed the limitations, risks, security and privacy concerns of performing an evaluation and management service by telemedicine and the availability of in-person appointments. I also discussed with the patient that there may be a patient responsible charge related to this service. The patient expressed understanding and agreed to proceed.   Other persons participating in the visit and their role in the encounter:  n/a  Patient's location:  Home Provider's location:  Office Chief Complaint:  Malignant neoplasm metastatic to brain [facility])  History of Present Ilness: Joshua [name] reports no clinical changes today.  He denies seizures.  No new or progressive neurologic changes.  Walking independently, using a cane as prior.  Continues on Durvalumab  with Dr. Babara, tolerating well.  Observations: Language and cognition at baseline  Imaging:  [facility] Clinician Interpretation: I have personally reviewed the CNS images as listed.  My interpretation, in the context of the patient's clinical presentation, is stable disease  MR BRAIN W WO CONTRAST Result Date: 07/22/2024 EXAM: MRI BRAIN WITH AND WITHOUT CONTRAST 07/22/2024 11:27:19 AM TECHNIQUE: Multiplanar multisequence MRI of the head/brain was performed with and without the administration of intravenous contrast (8 mL gadobutrol  (GADAVIST ) 1 MMOL/ML injection 8 mL GADOBUTROL  1 MMOL/ML IV SOLN). COMPARISON: MRI of the head dated 04/11/2024. CLINICAL HISTORY: Brain/CNS neoplasm, assess treatment response. FINDINGS: BRAIN AND VENTRICLES: There has been marginal interval improvement of metastatic disease to the brain. An enhancing nodular lesion previously noted within the left cerebellar hemisphere has decreased in size and enhancement, measuring approximately 1 to 2 mm in diameter. It is seen on  image 38 of series 16. A 9 mm peripherally enhancing lesion within the right occipital lobe is again noted on image 55, also demonstrating less enhancement than on the previous study. A lesion previously described within the left frontal lobe has slightly decreased in size from 11 x 11 mm to 10 x 8 mm. It also demonstrates significantly less enhancement on the current study. A cystic, peripherally enhancing lesion within the left parietal lobe appears similar to the prior study, measuring approximately 22 x 17 mm. It is seen on image 147. There are no new or enlarging lesions present. There is age-related atrophy and moderately advanced cerebral white matter disease. No acute infarct. No acute intracranial hemorrhage. No mass effect or midline shift. No hydrocephalus. The sella is unremarkable. Normal flow voids. ORBITS: No acute abnormality. SINUSES: No acute abnormality. BONES AND SOFT TISSUES: Normal bone marrow signal and enhancement. An ovoid nodule is again noted within the left parotid gland, measuring approximately 2.3 x 1.9 x 2.2 cm. There is also an adjacent submandibular nodule measuring approximately 2.2 x 1.7 x 2.1 cm, which appears to have developed in the interim. The lesions remain concerning for neoplasm with potential metastatic cervical lymphadenopathy. IMPRESSION: 1. 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 No new or enlarging lesions. 2. Stable cystic, peripherally enhancing lesion within the left parietal lobe. 3. Left parotid nodule again noted, with new/enlarged adjacent left submandibular nodule, concerning for neoplasm with possible metastatic cervical lymphadenopathy. 4. Age-related atrophy and moderately advanced cerebral white matter disease. Electronically signed by: Joshua Coho MD 07/22/2024 12:25 PM EST RP Workstation:  [id number]   Assessment and Plan: Malignant neoplasm  metastatic to brain [facility])  Clinically and radiographically stable.  Recommended continuing imaging surveillance only at this time.  Will ask Dr. Babara to follow up on parotid nodule seen incidentally on MRI study.    Otherwise should con't Keppra  1000mg  BID, will refill.  Follow Up Instructions: We ask that Joshua [name] return to clinic in 3 months following next brain MRI, or sooner as needed.  I discussed the assessment and treatment plan with the patient.  The patient was provided an opportunity to ask questions and all were answered.  The patient agreed with the plan and demonstrated understanding of the instructions.    The patient was advised to call back or seek an in-person evaluation if the symptoms worsen or if the condition fails to improve as anticipated.    Zamaya Rapaport K Miarose Lippert, MD   I provided 20 minutes of non face-to-face telephone visit time during this encounter, and > 50% was spent counseling as documented under my assessment & plan.

## 2024-07-30 ENCOUNTER — Other Ambulatory Visit: Payer: Self-pay

## 2024-08-01 ENCOUNTER — Inpatient Hospital Stay

## 2024-08-01 ENCOUNTER — Inpatient Hospital Stay: Admitting: Oncology

## 2024-08-02 ENCOUNTER — Inpatient Hospital Stay: Payer: Medicare (Managed Care)

## 2024-08-02 NOTE — Progress Notes (Signed)
 Nutrition Follow-up:  Patient with stage IV lung SCC.  Receiving durvalumab .  Followed by Dr Babara.    Spoke with patient via phone for nutrition follow-up.  Reports that he recently purchased equate plus (350 calorie) and likes it better than regular ensure because it does not have a strong vitamin taste or smell.  Reports that bread still does not taste good but still eats it anyway.  Has been eating fried bologna sandwiches, hamburgers.  Had [employer] shrimp and green beans recently.  Over the holidays ate at Aunt's house (biscuit, ham, pigs in blanket, cake).  Eating graham crackers, banana and drank shake so far today.  Takes shake with his medication    Medications: reviewed  Labs: reviewed  Anthropometrics:   Weight 180 lb decreased 188 lb 1.6 oz on 11/18 187 lb 3.2 oz on 10/20 184 lb 9.6 oz on 9/23 189 lb 11.2 oz on 9/8 189 lb on 8/28 222 lb 3.2 oz on 5/14  NUTRITION DIAGNOSIS: Inadequate oral intake stable   INTERVENTION:  Encouraged 350 calorie shake 1-2 times day Continue high calorie, high protein foods to help increase weight    MONITORING, EVALUATION, GOAL: weight trends, intake   NEXT VISIT: Tuesday, Feb 10 phone call  Roiza Wiedel B. Dasie SOLON, CSO, [place] Registered Dietitian [phone]'

## 2024-08-03 ENCOUNTER — Other Ambulatory Visit: Payer: Self-pay | Admitting: Radiation Therapy

## 2024-08-10 ENCOUNTER — Inpatient Hospital Stay: Payer: Medicare (Managed Care) | Admitting: Oncology

## 2024-08-10 ENCOUNTER — Inpatient Hospital Stay: Payer: Medicare (Managed Care)

## 2024-08-10 ENCOUNTER — Encounter: Payer: Self-pay | Admitting: Oncology

## 2024-08-10 VITALS — BP 128/68 | HR 78 | Temp 97.1°F | Resp 19

## 2024-08-10 VITALS — BP 132/82 | HR 86 | Temp 98.1°F | Resp 18 | Wt 182.2 lb

## 2024-08-10 DIAGNOSIS — Z5112 Encounter for antineoplastic immunotherapy: Secondary | ICD-10-CM

## 2024-08-10 DIAGNOSIS — E032 Hypothyroidism due to medicaments and other exogenous substances: Secondary | ICD-10-CM

## 2024-08-10 DIAGNOSIS — C3491 Malignant neoplasm of unspecified part of right bronchus or lung: Secondary | ICD-10-CM | POA: Diagnosis not present

## 2024-08-10 DIAGNOSIS — C7931 Secondary malignant neoplasm of brain: Secondary | ICD-10-CM | POA: Diagnosis not present

## 2024-08-10 DIAGNOSIS — D509 Iron deficiency anemia, unspecified: Secondary | ICD-10-CM | POA: Diagnosis not present

## 2024-08-10 DIAGNOSIS — E876 Hypokalemia: Secondary | ICD-10-CM

## 2024-08-10 LAB — CBC WITH DIFFERENTIAL/PLATELET
Abs Immature Granulocytes: 0.01 K/uL (ref 0.00–0.07)
Basophils Absolute: 0.1 K/uL (ref 0.0–0.1)
Basophils Relative: 2 %
Eosinophils Absolute: 0.3 K/uL (ref 0.0–0.5)
Eosinophils Relative: 5 %
HCT: 42.6 % (ref 39.0–52.0)
Hemoglobin: 13.3 g/dL (ref 13.0–17.0)
Immature Granulocytes: 0 %
Lymphocytes Relative: 21 %
Lymphs Abs: 1.2 K/uL (ref 0.7–4.0)
MCH: 24.3 pg — ABNORMAL LOW (ref 26.0–34.0)
MCHC: 31.2 g/dL (ref 30.0–36.0)
MCV: 77.9 fL — ABNORMAL LOW (ref 80.0–100.0)
Monocytes Absolute: 0.7 K/uL (ref 0.1–1.0)
Monocytes Relative: 12 %
Neutro Abs: 3.5 K/uL (ref 1.7–7.7)
Neutrophils Relative %: 60 %
Platelets: 206 K/uL (ref 150–400)
RBC: 5.47 MIL/uL (ref 4.22–5.81)
RDW: 21.5 % — ABNORMAL HIGH (ref 11.5–15.5)
WBC: 5.8 K/uL (ref 4.0–10.5)
nRBC: 0 % (ref 0.0–0.2)

## 2024-08-10 LAB — COMPREHENSIVE METABOLIC PANEL WITH GFR
ALT: 34 U/L (ref 0–44)
AST: 39 U/L (ref 15–41)
Albumin: 4.2 g/dL (ref 3.5–5.0)
Alkaline Phosphatase: 98 U/L (ref 38–126)
Anion gap: 10 (ref 5–15)
BUN: 8 mg/dL (ref 8–23)
CO2: 28 mmol/L (ref 22–32)
Calcium: 9.7 mg/dL (ref 8.9–10.3)
Chloride: 103 mmol/L (ref 98–111)
Creatinine, Ser: 0.85 mg/dL (ref 0.61–1.24)
GFR, Estimated: >60 mL/min
Glucose, Bld: 100 mg/dL — ABNORMAL HIGH (ref 70–99)
Potassium: 3.7 mmol/L (ref 3.5–5.1)
Sodium: 141 mmol/L (ref 135–145)
Total Bilirubin: 0.3 mg/dL (ref 0.0–1.2)
Total Protein: 7 g/dL (ref 6.5–8.1)

## 2024-08-10 LAB — TSH: TSH: 24.5 u[IU]/mL — ABNORMAL HIGH (ref 0.350–4.500)

## 2024-08-10 MED ORDER — SODIUM CHLORIDE 0.9 % IV SOLN
10.0000 mg/kg | Freq: Once | INTRAVENOUS | Status: AC
  Administered 2024-08-10: 860 mg via INTRAVENOUS
  Filled 2024-08-10: qty 10

## 2024-08-10 MED ORDER — SODIUM CHLORIDE 0.9 % IV SOLN
Freq: Once | INTRAVENOUS | Status: AC
  Filled 2024-08-10: qty 250

## 2024-08-10 NOTE — Assessment & Plan Note (Signed)
 continue oral potassium 20meq daily, monitor

## 2024-08-10 NOTE — Assessment & Plan Note (Signed)
 Off bevacizumab  04/11/2024 MRI brain w wo contrast results were reviewed. He is asymptomatic.  December 2025, MRI brain with and without contrast showed improvement. Follow up  with [name].

## 2024-08-10 NOTE — Assessment & Plan Note (Signed)
 Lab Results  Component Value Date   HGB 13.3 08/10/2024   TIBC 420 07/04/2024   IRONPCTSAT 7 (L) 07/04/2024   FERRITIN 20 (L) 07/04/2024    Hemoglobin has improved since the start of oral iron supplementation.  He may stop iron supplementation. Check iron panel next visit  Possible GI blood from being on Aspirin  and Plavix 

## 2024-08-10 NOTE — Assessment & Plan Note (Signed)
 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  Labs are reviewed and discussed with patient. Proceed with Durvalumab   CT scan showed NED.

## 2024-08-10 NOTE — Progress Notes (Signed)
 Hematology/Oncology Progress note [phone] [phone]    CHIEF COMPLAINTS/REASON FOR VISIT:  Follow up for lung cancer  ASSESSMENT & PLAN:   Cancer Staging  Squamous cell carcinoma of right lung (HCC) Staging form: Lung, AJCC 8th Edition - Clinical stage from 09/20/2020: Stage IV (cT1b, cN2, cM1) - Signed by [name], MD on 12/13/2021   Squamous cell carcinoma of right lung (HCC) Durvalumab  was previously held due to pneumonitis.  Labs are reviewed and discussed with patient. Proceed with Durvalumab   CT scan showed NED.    Encounter for antineoplastic immunotherapy Durvalumab  treatment as planned.   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  He may stop iron supplementation. 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 He is asymptomatic.  December 2025, MRI brain with and without contrast showed improvement. Follow up  with [name].     Follow up  2 weeks lab MD Durvalumab   All questions were answered. The patient knows to call the clinic with any problems, questions or concerns.  Call Babara, MD, PhD [facility] Hematology Oncology 08/10/2024       HISTORY OF PRESENTING ILLNESS:  Joshua [name] is a 69 y.o. male who has above history reviewed by me today presents for follow up visit for Stage IV lung squamous cell carcinoma. Oncology History  Squamous cell carcinoma of right lung (HCC)  09/18/2020 Imaging   PET scan showed right upper lobe  nodule maximal SUV 11.5.  Hypermetabolic cluster right paratracheal adenopathy with maximum SUV up to 12.  No findings of metastatic disease to the neck/abdomen/pelvis or skeleton.   09/20/2020 Initial Diagnosis   Squamous cell carcinoma of right lung (HCC) NGS showed PD-L1 TPS 95%, LRP1B S997fs, PIK3CA E545K, RB1 c1422-2A>T, RIT1 T83_ A84del, STK11 P281fs, TPS53 R248W, TMB 19.73mut/mb, MS stable.    09/20/2020 Cancer Staging   Staging form: Lung, AJCC 8th Edition - Clinical stage from 09/20/2020: Stage IV (cT1b, cN2, cM1) - Signed by [name], MD on 12/13/2021   10/03/2020 Imaging   MRI brain with and without contrast showed no evidence of intracranial metastatic disease. Well-defined round lesion within the left parotid tail measuring approximately 2.3 x 1.8 x 1.8 cm   10/09/2020 - 11/27/2020 Radiation Therapy   concurrent chemoradiation   10/10/2020 - 10/31/2020 Chemotherapy   10/10/2020 concurrent chemoradiation.  carboplatin  AUC of 2 and Taxol  45 mg/m2 x 4.  Additional chemotherapy was held due to thrombocytopenia, neutropenia       12/31/2020 - 03/21/2022 Chemotherapy   Maintenance  Durvalumab  q14d      12/31/2020 -  Chemotherapy   Patient is on Treatment Plan : LUNG Durvalumab  (10) q14d     03/28/2021 Imaging   CT without contrast showed new linear right perihilar consolidation with associated traction bronchiectasis.  Likely postradiation changes.  Additional new peribronchial vascular/nodular groundglass opacities with areas of consolidation or seen primarily in the right upper and lower lobes, more distant from expected radiation fields. 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  Per my discussion with pulmonology Dr. 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     05/09/2021 Imaging   Repeat CT chest without contrast showed evolving radiation changes in the  right perihilar region likely accounting for interval increase to thickening of the posterior wall of the right upper lobe bronchus.  The more peripheral right lung groundglass opacities seen on most recent study have generally improved with exception of one peripherally right upper lobe which appears new.  These are likely inflammatory.  No findings highly suspicious for local recurrence.   05/30/2021 - 01/10/2022 Chemotherapy   Durvalumab  q14d     10/07/2021 Imaging   MRI brain with and without contrast showed 2 contrast-enhancing lesions, most consistent with metastatic disease.  Largest lesion is in the left frontal lobe and measures up to 1.3 cm with a large amount of surrounding edema and 9 mm rightward midline shift.  The other enhancing lesion was 4 mm in the right occipital lobe with a small amount of surrounding edema.  No acute or chronic hemorrhage.   10/07/2021 Progression   Headache after colonoscopy, progressive difficulty using her right arm.  Progressively worsening of speech. Stage IV lung cancer with brain metastasis.  started on Dexamethasone .  He was seen by neurosurgery and radiation oncology.  Recommendation is to proceed with SRS.  Patient was discharged with dexamethasone    10/16/2021 Imaging   PET scan showed postradiation changes in the right supra hilar lung without evidence of local recurrence.  No evidence of lung cancer recurrence or metastatic disease.  Single focus of intense metabolic activity associated with posterior left 11th rib likely secondary to recent fall/posttraumatic metabolic changes.  Frontal  uptake in the left frontal lobe corresponding to the known metastatic lesion in the brain   10/29/2021 -  Radiation Therapy   Brain Radiation [facility]   12/10/2021 Imaging   Brain MRI: Continued interval decrease in size of a left frontal lobe metastasis, now measuring 10 mm. Moderate surrounding edema has also decreased.  3-4 mm metastasis within the right occipital  lobe, not significantly changed in size. There is now a small focus of central non enhancement within this lesion. Increased edema. 3 mm enhancing metastasis within the high left parietal lobe. In retrospect, this was likely present as a subtle punctate focus of enhancement on the prior brain MRI of 10/22/2021.   01/18/2022 Imaging   CT chest abdomen pelvis w contrast 1. Stable post treatment changes in the right upper lobe medially with dense radiation fibrosis but no findings to suggest local recurrent tumor. 2. Significant progression of ill-defined ground-glass opacity, interstitial thickening and airspace nodularity in the right upper lobe and upper aspect of the right lower lobe. Findings could reflect an inflammatory or atypical infectious process, drug induced pneumonitis or interstitial spread of tumor.3. No mediastinal or hilar mass or adenopathy. 4. No findings for abdominal/pelvic metastatic disease or osseous metastatic disease.   02/19/2022 Imaging   MRI brain w wo contrast showed Three metastatic deposits in the brain appear smaller. Decreased edema in the left parietal lobe. No new lesions    04/04/2022 Imaging   CT chest w contrast 1. Stable radiation changes in the right perihilar region without evidence of local recurrence. 2. Interval improvement in the patchy ground-glass opacities previously demonstrated in both lungs, most consistent with a resolving infectious or inflammatory process (including immunotherapy-related pneumonitis). 3. No evidence  of metastatic disease.   05/14/2022 Miscellaneous   He had a mechanical fall and fractured his left elbow. xrays showed a left elbow fracture  05/26/22 seen by orthopedic surgeon, cast applied.    05/27/2022 Imaging   MRI brain w wo contrast 1. All three known brain metastases have enlarged since August, with increasing regional edema. And there is a small new left anterior cerebellar 4-5 mm metastasis (was questionably punctate on  the May MRI). This constellation could reflect a combination of True Progression (Left Cerebellar lesion) and pseudoprogression/radionecrosis (3 existing mets).    2. No significant intracranial mass effect. 3. Underlying moderate cerebral white matter signal changes.   05/30/2022 Imaging   CT chest abdomen pelvis w contrast  1. 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 There is a stable small right pleural effusion, but no definitive findings to suggest residual or recurrent disease on today's examination. 2. No signs of metastatic disease in the abdomen or pelvis.  3. Aortic atherosclerosis, in addition to two-vessel coronary artery disease. Please note that although the presence of coronary artery calcium  documents the presence of coronary artery disease, the severity of this disease and any potential stenosis cannot be assessed on this non-gated CT examination. Assessment for potential risk factor modification, dietary therapy or pharmacologic therapy may be warranted, if clinically indicated. 4. Hepatic steatosis. 5. Colonic diverticulosis without evidence of acute diverticulitis at this time. 6. Incompletely imaged but apparently occluded vascular graft in the superior aspect of the right anterior thigh. Referral to vascular specialist for further clinical evaluation should be considered if clinically appropriate. 7. Additional incidental findings, as above.   07/22/2022 Imaging   MRI brain  1. No new or enlarging lesions. No intracranial disease progression. 2. Decreased size of treated lesions of the left frontoparietal junction and left postcentral gyrus. 3. Unchanged lesions in the left cerebellum and right occipital lobe.   10/15/2022 Imaging   MRI brain w wo contrast showed Interval increase in size of multiple intraparenchymal lesions with worsened surrounding  vasogenic edema. 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 No new lesions   11/05/2022 Imaging   MRI brain w wo contrast  1. Likely slight increase and right occipital and left cerebellar lesions. Otherwise, unchanged metastases, as above. No new lesions identified. 2. Partially imaged large probable lymph node in the left upper neck, apparent on prior PET CT.   11/13/2022 -  Radiation Therapy   Each of the sites were treated with SRS/SBRT/SRT-IMRT technique to 20 Gy in 1 fraction to each of the following sites: PTV_2_LCerebel_37mm PTV_3_RCerebel_74mm PTV_4_ROccip_10mm PTV_5_LParietal_64mm   11/27/2022 Imaging   CT chest abdomen pelvis w contrast showed  Improving bilateral scattered interstitial and ground-glass opacities. Small amount of residual areas in the extreme left lower lobe inferiorly. This specific area is slightly increased from previous but overall the amount of opacities is significantly improved. Recommend continued follow-up. No developing new mass lesion, fluid collection or lymph node enlargement. Stable posttreatment changes right perihilar in the lung. Fatty liver infiltration. Colonic diverticulosis. Improved tiny right pleural effusion   02/05/2023 Imaging   MRI brain with and without contrast Interval decrease in all of the metastatic lesions, detailed above.No new lesions identified.    Imaging   CT chest abdomen pelvis w contrast showed  1. 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  2. Trace right pleural fluid or thickening, new or minimally increased. 3. Incidental findings, including: Aortic atherosclerosis (ICD10-I70.0), coronary artery atherosclerosis and emphysema (ICD10-J43.9). Hepatic steatosis.   06/25/2023 Imaging   CT chest abdomen pelvis w contrast showed 1. Unchanged post treatment/post radiation appearance of  the right lung, with perihilar and suprahilar paramedian fibrosis and consolidation. 2. No evidence of lymphadenopathy or metastatic disease in the chest, abdomen, or pelvis. 3. Hepatic steatosis. 4. Coronary artery disease.   Aortic Atherosclerosis (ICD10-I70.0) and Emphysema (ICD10-J43.9).   08/26/2023 [facility] Admission   hospitalization due to was observed speech and jerking movements on right side despite being on dexamethasone . Continuous EEG suggest cortical dysfunction arising from left temporal region.  No seizure or elective form discharges were seen. Patient was discharged on dexamethasone  4 mg, Keppra , lamotrigine .    11/04/2023 -  Chemotherapy   Add Bevacizumab  5mg /kg to Durvalumab .     11/17/2023 - 11/27/2023 Radiation Therapy   Patient received radiation to brain for treatment of recurrence.   02/23/2024 Imaging   CT chest abdomen pelvis w contrast showed  CHEST:   1. Post RIGHT upper lobe resection. 2. No evidence of lung cancer recurrence. 3. No mediastinal adenopathy.   PELVIS:   1. No evidence of metastatic disease in the abdomen pelvis. 2. No skeletal metastasis.     INTERVAL HISTORY Joshua [name] is a 69 y.o. male who has above history reviewed by me today presents for follow up visit for right lung RCC.  chronic SOB,not worse.  Peripheral artery disease,Patient is on aspirin  and Plavix . No new complaints patient takes oral iron supplementation.  Occasional constipation.   Review of Systems  Constitutional:  Positive for fatigue. Negative for appetite change, chills, fever and unexpected weight change.  HENT:   Negative for hearing loss and voice change.   Eyes:  Negative for eye problems and icterus.  Respiratory:  Negative for chest tightness, cough and shortness of breath.   Cardiovascular:  Negative for chest pain and leg swelling.  Gastrointestinal:  Negative for abdominal distention, abdominal pain, nausea and vomiting.  Endocrine:  Negative for hot flashes.  Genitourinary:  Negative for difficulty urinating, dysuria and frequency.   Musculoskeletal:  Positive for gait problem. Negative for arthralgias.  Skin:  Negative for itching and rash.  Neurological:  Positive for gait problem. Negative for extremity weakness, headaches, numbness and speech difficulty.  Hematological:  Negative for adenopathy. Bruises/bleeds easily.  Psychiatric/Behavioral:  Negative for confusion.       MEDICAL HISTORY:  Past Medical History:  Diagnosis Date   Allergy    Cancer (HCC)    lung   COPD (chronic obstructive pulmonary disease) (HCC)    Dyspnea    former smoker. 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   Malignant neoplasm metastatic to brain [facility])    Nausea with vomiting 02/01/2024   Peripheral vascular disease    Pre-diabetes    Sleep apnea     SURGICAL HISTORY: Past Surgical History:  Procedure Laterality Date   ANTERIOR CERVICAL DECOMP/DISCECTOMY FUSION N/A 03/14/2020   Procedure: ANTERIOR CERVICAL DECOMPRESSION/DISCECTOMY FUSION 3 LEVELS C4-7;  Surgeon: Clois Fret, MD;  Location: [facility] ORS;  Service: Neurosurgery;  Laterality: N/A;   BACK SURGERY  2011   CENTRAL LINE INSERTION Right 09/10/2016   Procedure: CENTRAL LINE INSERTION;  Surgeon: Jama Cordella MATSU, MD;  Location: [facility] ORS;  Service: Vascular;  Laterality: Right;  CERVICAL FUSION     C4-c7 on Mar 14, 2020   COLONOSCOPY  2013 ?   COLONOSCOPY N/A 09/09/2021   Procedure: COLONOSCOPY;  Surgeon: Onita Elspeth Sharper, DO;  Location: [facility] ENDOSCOPY;  Service: Gastroenterology;  Laterality: N/A;   CORONARY ANGIOPLASTY     ESOPHAGOGASTRODUODENOSCOPY N/A 09/09/2021   Procedure: ESOPHAGOGASTRODUODENOSCOPY (EGD);  Surgeon: Onita Elspeth Sharper, DO;  Location: [facility] ENDOSCOPY;  Service: Gastroenterology;  Laterality: N/A;   FEMORAL-POPLITEAL BYPASS GRAFT Right 09/10/2016   Procedure: BYPASS GRAFT  FEMORAL-POPLITEAL ARTERY ( BELOW KNEE );  Surgeon: Jama Cordella MATSU, MD;  Location: [facility] ORS;  Service: Vascular;  Laterality: Right;   FEMORAL-TIBIAL BYPASS GRAFT Right 03/23/2020   Procedure: BYPASS GRAFT RIGHT FEMORAL- DISTAL TIBIAL ARTERY WITH DISTAL FLOW GRAFT;  Surgeon: Jama Cordella MATSU, MD;  Location: [facility] ORS;  Service: Vascular;  Laterality: Right;   LEFT HEART CATH AND CORONARY ANGIOGRAPHY Left 05/16/2021   Procedure: LEFT HEART CATH AND CORONARY ANGIOGRAPHY;  Surgeon: Lawyer Bernardino Cough, MD;  Location: [facility] INVASIVE CV LAB;  Service: Cardiovascular;  Laterality: Left;   LOWER EXTREMITY ANGIOGRAPHY Right 11/18/2016   Procedure: Lower Extremity Angiography;  Surgeon: Cordella MATSU Jama, MD;  Location: [facility] INVASIVE CV LAB;  Service: Cardiovascular;  Laterality: Right;   LOWER EXTREMITY ANGIOGRAPHY Right 12/09/2016   Procedure: Lower Extremity Angiography;  Surgeon: Jama Cordella MATSU, MD;  Location: [facility] INVASIVE CV LAB;  Service: Cardiovascular;  Laterality: Right;   LOWER EXTREMITY ANGIOGRAPHY Right 11/15/2019   Procedure: LOWER EXTREMITY ANGIOGRAPHY;  Surgeon: Jama Cordella MATSU, MD;  Location: [facility] INVASIVE CV LAB;  Service: Cardiovascular;  Laterality: Right;   LOWER EXTREMITY ANGIOGRAPHY Right 12/01/2019   Procedure: Lower Extremity Angiography;  Surgeon: Marea Selinda RAMAN, MD;  Location: [facility] INVASIVE CV LAB;  Service: Cardiovascular;  Laterality: Right;   LOWER EXTREMITY ANGIOGRAPHY Right 12/02/2019   Procedure: LOWER EXTREMITY ANGIOGRAPHY;  Surgeon: Jama Cordella MATSU, MD;  Location: [facility] INVASIVE CV LAB;  Service: Cardiovascular;  Laterality: Right;   LOWER EXTREMITY ANGIOGRAPHY Right 03/23/2020   Procedure: Lower Extremity Angiography;  Surgeon: Jama Cordella MATSU, MD;  Location: [facility] INVASIVE CV LAB;  Service: Cardiovascular;  Laterality: Right;   LOWER EXTREMITY ANGIOGRAPHY Right 09/25/2021   Procedure: Lower Extremity Angiography;  Surgeon: Jama Cordella MATSU, MD;  Location: [facility] INVASIVE  CV LAB;  Service: Cardiovascular;  Laterality: Right;   LOWER EXTREMITY ANGIOGRAPHY Right 09/26/2021   Procedure: Lower Extremity Angiography;  Surgeon: Marea Selinda RAMAN, MD;  Location: [facility] INVASIVE CV LAB;  Service: Cardiovascular;  Laterality: Right;   LOWER EXTREMITY ANGIOGRAPHY Right 04/08/2022   Procedure: Lower Extremity Angiography;  Surgeon: Jama Cordella MATSU, MD;  Location: [facility] INVASIVE CV LAB;  Service: Cardiovascular;  Laterality: Right;   LOWER EXTREMITY ANGIOGRAPHY Right 03/04/2024   Procedure: Lower Extremity Angiography;  Surgeon: Jama Cordella MATSU, MD;  Location: [facility] INVASIVE CV LAB;  Service: Cardiovascular;  Laterality: Right;   LOWER EXTREMITY ANGIOGRAPHY Right 03/25/2024   Procedure: Lower Extremity Angiography;  Surgeon: Jama Cordella MATSU, MD;  Location: [facility] INVASIVE CV LAB;  Service: Cardiovascular;  Laterality: Right;   LOWER EXTREMITY INTERVENTION  11/18/2016   Procedure: Lower Extremity Intervention;  Surgeon: Cordella MATSU Jama, MD;  Location: [facility] INVASIVE CV LAB;  Service: Cardiovascular;;   LOWER EXTREMITY INTERVENTION Right 03/04/2024   Procedure: LOWER EXTREMITY INTERVENTION;  Surgeon: Jama Cordella MATSU, MD;  Location: [facility] INVASIVE CV LAB;  Service: Cardiovascular;  Laterality: Right;   PERIPHERAL VASCULAR CATHETERIZATION Right 01/02/2015   Procedure: Lower Extremity Angiography;  Surgeon: Cordella  KANDICE Shawl, MD;  Location: [facility] INVASIVE CV LAB;  Service: Cardiovascular;  Laterality: Right;   PERIPHERAL VASCULAR CATHETERIZATION Right 06/18/2015   Procedure: Lower Extremity Angiography;  Surgeon: Selinda GORMAN Gu, MD;  Location: [facility] INVASIVE CV LAB;  Service: Cardiovascular;  Laterality: Right;   PERIPHERAL VASCULAR CATHETERIZATION  06/18/2015   Procedure: Lower Extremity Intervention;  Surgeon: Selinda GORMAN Gu, MD;  Location: [facility] INVASIVE CV LAB;  Service: Cardiovascular;;   PERIPHERAL VASCULAR CATHETERIZATION N/A 10/09/2015   Procedure: Abdominal Aortogram w/Lower Extremity;   Surgeon: Cordella KANDICE Shawl, MD;  Location: [facility] INVASIVE CV LAB;  Service: Cardiovascular;  Laterality: N/A;   PERIPHERAL VASCULAR CATHETERIZATION  10/09/2015   Procedure: Lower Extremity Intervention;  Surgeon: Cordella KANDICE Shawl, MD;  Location: [facility] INV[facility] CV LAB;  Service: Cardiovascular;;   PERIPHERAL VASCULAR CATHETERIZATION Right 10/10/2015   Procedure: Lower Extremity Angiography;  Surgeon: Selinda GORMAN Gu, MD;  Location: [facility] INV[facility] CV LAB;  Service: Cardiovascular;  Laterality: Right;   PERIPHERAL VASCULAR CATHETERIZATION Right 10/10/2015   Procedure: Lower Extremity Intervention;  Surgeon: Selinda GORMAN Gu, MD;  Location: [facility] INV[facility] CV LAB;  Service: Cardiovascular;  Laterality: Right;   PERIPHERAL VASCULAR CATHETERIZATION Right 12/03/2015   Procedure: Lower Extremity Angiography;  Surgeon: Selinda GORMAN Gu, MD;  Location: [facility] INV[facility] LAB;  Service: Cardiovascular;  Laterality: Right;   PERIPHERAL VASCULAR CATHETERIZATION Right 12/04/2015   Procedure: Lower Extremity Angiography;  Surgeon: Selinda GORMAN Gu, MD;  Location: [facility] INV[facility] LAB;  Service: Cardiovascular;  Laterality: Right;   PERIPHERAL VASCULAR CATHETERIZATION  12/04/2015   Procedure: Lower Extremity Intervention;  Surgeon: Selinda GORMAN Gu, MD;  Location: [facility] IN[facility] CV LAB;  Service: Cardiovascular;;   PERIPHERAL VASCULAR CATHETERIZATION Right 06/30/2016   Procedure: Lower Extremity Angiography;  Surgeon: Selinda GORMAN Gu, MD;  Location: [facility] INV[facility] CV LAB;  Service: Cardiovascular;  Laterality: Right;   PERIPHERAL VASCULAR CATHETERIZATION Right 06/25/2016   Procedure: Lower Extremity Angiography;  Surgeon: Cordella KANDICE Shawl, MD;  Location: [facility] IN[facility];  Service: Cardiovascular;  Laterality: Right;   PERIPHERAL VASCULAR CATHETERIZATION Right 07/01/2016   Procedure: Lower Extremity Angiography;  Surgeon: Cordella KANDICE Shawl, MD;  Location: [facility] INV[facility]IVE CV LAB;  Service: Cardiovascular;  Laterality: Right;   PERIPHERAL VASCULAR  CATHETERIZATION  07/01/2016   Procedure: Lower Extremity Intervention;  Surgeon: Cordella KANDICE Shawl, MD;  Location: [facility] IN[facility] LAB;  Service: Cardiovascular;;   PERIPHERAL VASCULAR CATHETERIZATION Right 08/01/2016   Procedure: Lower Extremity Angiography;  Surgeon: Cordella KANDICE Shawl, MD;  Location: [facility] IN[facility] CV LAB;  Service: Cardiovascular;  Laterality: Right;   PORTA CATH INSERTION N/A 10/05/2020   Procedure: PORTA CATH INSERTION;  Surgeon: Shawl Cordella KANDICE, MD;  Location: [facility] INVASIVE CV LAB;  Service: Cardiovascular;  Laterality: N/A;   stent placement in right leg Right    VIDEO BRONCHOSCOPY N/A 09/14/2020   Procedure: VIDEO BRONCHOSCOPY WITH FLUORO;  Surgeon: Tamea Dedra CROME, MD;  Location: [facility] ORS;  Service: Cardiopulmonary;  Laterality: N/A;   VIDEO BRONCHOSCOPY WITH ENDOBRONCHIAL ULTRASOUND N/A 09/14/2020   Procedure: VIDEO BRONCHOSCOPY WITH ENDOBRONCHIAL ULTRASOUND;  Surgeon: Tamea Dedra CROME, MD;  Location: [facility] ORS;  Service: Cardiopulmonary;  Laterality: N/A;    SOCIAL HISTORY: Social History   Socioeconomic History   Marital status: Single    Spouse name: Not on file   Number of children: Not on file   Years of education: Not on file   Highest education level: Not on file  Occupational History   Occupation: unemployed  Tobacco Use  Smoking status: Former    Current packs/day: 0.00    Average packs/day: 2.0 packs/day for 38.0 years (76.0 ttl pk-yrs)    Types: Cigarettes    Start date: 12/19/1976    Quit date: 12/20/2014    Years since quitting: 9.6   Smokeless tobacco: Former   Tobacco comments:    quit smoking in 2017 most smoked 2   Vaping Use   Vaping status: Never Used  Substance and Sexual Activity   Alcohol  use: Not Currently   Drug use: No   Sexual activity: Yes  Other Topics Concern   Not on file  Social History Narrative   Not on file   Social Drivers of Health   Tobacco Use: Medium Risk (08/10/2024)   Patient History    Smoking  Tobacco Use: Former    Smokeless Tobacco Use: Former    Passive Exposure: Not on Actuary Strain: Low Risk (08/05/2024)   Overall Financial Resource Strain (CARDIA)    Difficulty of Paying Living Expenses: Not hard at all  Food Insecurity: No Food Insecurity (08/05/2024)   Epic    Worried About Programme Researcher, Broadcasting/film/video in the Last Year: Never true    Ran Out of Food in the Last Year: Never true  Transportation Needs: No Transportation Needs (08/05/2024)   Epic    Lack of Transportation (Medical): No    Lack of Transportation (Non-Medical): No  Physical Activity: Inactive (08/05/2024)   Exercise Vital Sign    Days of Exercise per Week: 1 day    Minutes of Exercise per Session: 0 min  Stress: No Stress Concern Present (08/05/2024)   [employer] - Occupational Stress Questionnaire    Feeling of Stress: Not at all  Social Connections: Socially Isolated (08/05/2024)   Social Connection and Isolation Panel    Frequency of Communication with Friends and Family: Three times a week    Frequency of Social Gatherings with Friends and Family: Once a week    Attends Religious Services: Never    Database Administrator or Organizations: No    Attends Engineer, Structural: Not on file    Marital Status: Never married  Intimate Partner Violence: Not At Risk (03/25/2024)   Epic    Fear of Current or Ex-Partner: No    Emotionally Abused: No    Physically Abused: No    Sexually Abused: No  Depression (PHQ2-9): Low Risk (08/10/2024)   Depression (PHQ2-9)    PHQ-2 Score: 0  Alcohol  Screen: Not on file  Housing: Low Risk (08/05/2024)   Epic    Unable to Pay for Housing in the Last Year: No    Number of Times Moved in the Last Year: 0    Homeless in the Last Year: No  Utilities: Not At Risk (03/25/2024)   Epic    Threatened with loss of utilities: No  Health Literacy: Not on file    FAMILY HISTORY: Family History  Problem Relation Age of Onset    Diabetes Mother    Hypertension Mother    Heart murmur Mother    Leukemia Mother    Throat cancer Maternal Grandmother    Colon cancer Neg Hx    Breast cancer Neg Hx     ALLERGIES:  has no known allergies.  MEDICATIONS:  Current Outpatient Medications  Medication Sig Dispense Refill   aspirin  EC 81 MG tablet Take 81 mg by mouth daily. Swallow whole.     clopidogrel  (PLAVIX )  75 MG tablet Take 1 tablet (75 mg total) by mouth daily. 100 tablet 2   Cyanocobalamin (B-12) 1000 MCG CHEW Chew 1 tablet by mouth daily at 6 (six) AM.     Ferrous Sulfate 50 MG TBCR Take 1 tablet by mouth daily at 6 (six) AM.     levETIRAcetam  (KEPPRA ) 1000 MG tablet Take 1 tablet (1,000 mg total) by mouth 2 (two) times daily. 180 tablet 1   levothyroxine  (SYNTHROID ) 125 MCG tablet Take 1 tablet (125 mcg total) by mouth daily before breakfast. 90 tablet 0   potassium chloride  SA (KLOR-CON  M) 20 MEQ tablet Take 1 tablet (20 mEq total) by mouth daily. 30 tablet 2   simvastatin  (ZOCOR ) 40 MG tablet Take 1 tablet (40 mg total) by mouth daily. 30 tablet 0   No current facility-administered medications for this visit.   Facility-Administered Medications Ordered in Other Visits  Medication Dose Route Frequency Provider Last Rate Last Admin   albuterol  (PROVENTIL ) (2.5 MG/3ML) 0.083% nebulizer solution 2.5 mg  2.5 mg Nebulization Once Gonzalez, Carmen L, MD       durvalumab  (IMFINZI ) 860 mg in sodium chloride  0.9 % 100 mL chemo infusion  10 mg/kg (Treatment Plan Recorded) Intravenous Once [name], MD 117 mL/hr at 08/10/24 1440 860 mg at 08/10/24 1440   heparin  lock flush 100 unit/mL  500 Units Intracatheter Once PRN [name], MD         PHYSICAL EXAMINATION: ECOG PERFORMANCE STATUS: 1 - Symptomatic but completely ambulatory Vitals:   08/10/24 1341  BP: 132/82  Pulse: 86  Resp: 18  Temp: 98.1 F (36.7 C)  SpO2: 100%    Filed Weights   08/10/24 1341  Weight: 182 lb 3.2 oz (82.6 kg)     Physical  Exam Constitutional:      General: He is not in acute distress.    Appearance: He is obese.  HENT:     Head: Normocephalic and atraumatic.  Eyes:     General: No scleral icterus. Cardiovascular:     Rate and Rhythm: Normal rate and regular rhythm.  Pulmonary:     Effort: Pulmonary effort is normal. No respiratory distress.     Breath sounds: Normal breath sounds. No wheezing.  Abdominal:     General: There is no distension.     Palpations: Abdomen is soft.     Tenderness: There is no abdominal tenderness.  Musculoskeletal:     Cervical back: Normal range of motion and neck supple.     Comments: Right lower extremity wrapped.   Skin:    General: Skin is warm and dry.     Findings: Bruising present. No erythema.  Neurological:     Mental Status: He is alert and oriented to person, place, and time. Mental status is at baseline.     Cranial Nerves: No cranial nerve deficit.     Motor: No abnormal muscle tone.  Psychiatric:        Mood and Affect: Mood and affect normal.         LABORATORY DATA:  I have reviewed the data as listed    Latest Ref Rng & Units 08/10/2024    1:26 PM 07/25/2024    8:56 AM 07/04/2024   10:33 AM  CBC  WBC 4.0 - 10.5 K/uL 5.8  6.7  8.1   Hemoglobin 13.0 - 17.0 g/dL 86.6  87.2  88.6   Hematocrit 39.0 - 52.0 % 42.6  40.9  36.1   Platelets 150 -  400 K/uL 206  208  181       Latest Ref Rng & Units 08/10/2024    1:26 PM 07/25/2024    8:56 AM 07/04/2024   10:33 AM  CMP  Glucose 70 - 99 mg/dL 899  881  872   BUN 8 - 23 mg/dL 8  11  8    Creatinine 0.61 - 1.24 mg/dL 9.14  9.00  9.05   Sodium 135 - 145 mmol/L 141  137  139   Potassium 3.5 - 5.1 mmol/L 3.7  4.2  3.8   Chloride 98 - 111 mmol/L 103  101  104   CO2 22 - 32 mmol/L 28  26  23    Calcium  8.9 - 10.3 mg/dL 9.7  9.9  9.3   Total Protein 6.5 - 8.1 g/dL 7.0  7.3  6.9   Total Bilirubin 0.0 - 1.2 mg/dL 0.3  0.2  0.4   Alkaline Phos 38 - 126 U/L 98  78  85   AST 15 - 41 U/L 39  21  21   ALT 0 -  44 U/L 34  13  12      RADIOGRAPHIC STUDIES: I have personally reviewed the radiological images as listed and agreed with the findings in the report. MR BRAIN W WO CONTRAST Result Date: 07/22/2024 EXAM: MRI BRAIN WITH AND WITHOUT CONTRAST 07/22/2024 11:27:19 AM TECHNIQUE: Multiplanar multisequence MRI of the head/brain was performed with and without the administration of intravenous contrast (8 mL gadobutrol  (GADAVIST ) 1 MMOL/ML injection 8 mL GADOBUTROL  1 MMOL/ML IV SOLN). COMPARISON: MRI of the head dated 04/11/2024. CLINICAL HISTORY: Brain/CNS neoplasm, assess treatment response. FINDINGS: BRAIN AND VENTRICLES: There has been marginal interval improvement of metastatic disease to the brain. An enhancing nodular lesion previously noted within the left cerebellar hemisphere has decreased in size and enhancement, measuring approximately 1 to 2 mm in diameter. It is seen on image 38 of series 16. A 9 mm peripherally enhancing lesion within the right occipital lobe is again noted on image 55, also demonstrating less enhancement than on the previous study. A lesion previously described within the left frontal lobe has slightly decreased in size from 11 x 11 mm to 10 x 8 mm. It also demonstrates significantly less enhancement on the current study. A cystic, peripherally enhancing lesion within the left parietal lobe appears similar to the prior study, measuring approximately 22 x 17 mm. It is seen on image 147. There are no new or enlarging lesions present. There is age-related atrophy and moderately advanced cerebral white matter disease. No acute infarct. No acute intracranial hemorrhage. No mass effect or midline shift. No hydrocephalus. The sella is unremarkable. Normal flow voids. ORBITS: No acute abnormality. SINUSES: No acute abnormality. BONES AND SOFT TISSUES: Normal bone marrow signal and enhancement. An ovoid nodule is again noted within the left parotid gland, measuring approximately 2.3 x 1.9 x 2.2  cm. There is also an adjacent submandibular nodule measuring approximately 2.2 x 1.7 x 2.1 cm, which appears to have developed in the interim. The lesions remain concerning for neoplasm with potential metastatic cervical lymphadenopathy. IMPRESSION: 1. 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 No new or enlarging lesions. 2. Stable cystic, peripherally enhancing lesion within the left parietal lobe. 3. Left parotid nodule again noted, with new/enlarged adjacent left submandibular nodule, concerning for neoplasm with possible metastatic cervical lymphadenopathy. 4. Age-related  atrophy and moderately advanced cerebral white matter disease. Electronically signed by: Evalene Coho MD 07/22/2024 12:25 PM EST RP Workstation: [id number]   CT CHEST ABDOMEN PELVIS W CONTRAST Result Date: 06/30/2024 CLINICAL DATA:  Lung cancer restaging * Tracking Code: BO * EXAM: CT CHEST, ABDOMEN, AND PELVIS WITH CONTRAST TECHNIQUE: Multidetector CT imaging of the chest, abdomen and pelvis was performed following the standard protocol during bolus administration of intravenous contrast. RADIATION DOSE REDUCTION: This exam was performed according to the departmental dose-optimization program which includes automated exposure control, adjustment of the mA and/or kV according to patient size and/or use of iterative reconstruction technique. CONTRAST:  OMNIPAQUE  IOHEXOL  300 MG/ML  SOLN COMPARISON:  02/23/2024 FINDINGS: CT CHEST FINDINGS Cardiovascular: Right chest port catheter. Scattered aortic atherosclerosis. Normal heart size. Scattered left and right coronary artery calcifications. No pericardial effusion. Mediastinum/Nodes: Unchanged treated soft tissue about the right hilum (series 2, image 28). No discretely enlarged mediastinal, hilar, or axillary lymph nodes. Thyroid   gland, trachea, and esophagus demonstrate no significant findings. Lungs/Pleura: Unchanged post treatment/post radiation appearance of the right chest with dense perihilar and suprahilar consolidation and fibrosis. Mild underlying centrilobular emphysema. Occasional small pulmonary nodules unchanged, for example a 0.4 cm nodule of the dependent right lower lobe (series 4, image 71). No pleural effusion or pneumothorax. Musculoskeletal: No chest wall abnormality. No acute osseous findings. CT ABDOMEN PELVIS FINDINGS Hepatobiliary: No solid liver abnormality is seen. No gallstones, gallbladder wall thickening, or biliary dilatation. Pancreas: Unremarkable. No pancreatic ductal dilatation or surrounding inflammatory changes. Spleen: Normal in size without significant abnormality. Adrenals/Urinary Tract: Adrenal glands are unremarkable. Kidneys are normal, without renal calculi, solid lesion, or hydronephrosis. Bladder is unremarkable. Stomach/Bowel: Stomach is within normal limits. Appendix appears normal. No evidence of bowel wall thickening, distention, or inflammatory changes. Sigmoid diverticulosis. Vascular/Lymphatic: Aortic atherosclerosis. Partially imaged right femoral bypass grafting (series 2, image 135). No enlarged abdominal or pelvic lymph nodes. Reproductive: No mass or other abnormality. Other: No abdominal wall hernia or abnormality. No ascites. Musculoskeletal: No acute osseous findings. IMPRESSION: 1. Unchanged post treatment/post radiation appearance of the right chest with dense perihilar and suprahilar consolidation and fibrosis. 2. Unchanged treated soft tissue about the right hilum. No discretely enlarged mediastinal, hilar, or axillary lymph nodes. 3. Occasional small pulmonary nodules unchanged nonspecific. Attention on follow-up. 4. No evidence of metastatic disease in the abdomen or pelvis. 5. Emphysema. 6. Coronary artery disease. Aortic Atherosclerosis (ICD10-I70.0) and Emphysema  (ICD10-J43.9). Electronically Signed   By: Marolyn JONETTA Jaksch M.D.   On: 06/30/2024 05:25

## 2024-08-10 NOTE — Assessment & Plan Note (Signed)
 continue levothyroxine  125 mcg daily TSH is pending

## 2024-08-10 NOTE — Assessment & Plan Note (Signed)
Durvalumab treatment as planned.  

## 2024-08-11 ENCOUNTER — Ambulatory Visit
Admission: RE | Admit: 2024-08-11 | Discharge: 2024-08-11 | Disposition: A | Discharge status: Home / Self Care | Payer: Medicare (Managed Care) | Source: Ambulatory Visit | Attending: Unknown Physician Specialty | Admitting: Unknown Physician Specialty

## 2024-08-11 DIAGNOSIS — D37030 Neoplasm of uncertain behavior of the parotid salivary glands: Secondary | ICD-10-CM

## 2024-08-11 LAB — T4: T4, Total: 8.6 ug/dL (ref 4.5–12.0)

## 2024-08-12 ENCOUNTER — Ambulatory Visit (INDEPENDENT_AMBULATORY_CARE_PROVIDER_SITE_OTHER): Payer: Medicare (Managed Care)

## 2024-08-12 ENCOUNTER — Other Ambulatory Visit: Payer: Self-pay | Admitting: Oncology

## 2024-08-12 VITALS — BP 122/78 | HR 78 | Ht 71.0 in | Wt 183.2 lb

## 2024-08-12 DIAGNOSIS — C7931 Secondary malignant neoplasm of brain: Secondary | ICD-10-CM | POA: Diagnosis not present

## 2024-08-12 DIAGNOSIS — Z23 Encounter for immunization: Secondary | ICD-10-CM

## 2024-08-12 DIAGNOSIS — C3491 Malignant neoplasm of unspecified part of right bronchus or lung: Secondary | ICD-10-CM | POA: Diagnosis not present

## 2024-08-12 DIAGNOSIS — R569 Unspecified convulsions: Secondary | ICD-10-CM | POA: Diagnosis not present

## 2024-08-12 DIAGNOSIS — E559 Vitamin D deficiency, unspecified: Secondary | ICD-10-CM | POA: Diagnosis not present

## 2024-08-12 DIAGNOSIS — I743 Embolism and thrombosis of arteries of the lower extremities: Secondary | ICD-10-CM | POA: Diagnosis not present

## 2024-08-12 DIAGNOSIS — J449 Chronic obstructive pulmonary disease, unspecified: Secondary | ICD-10-CM

## 2024-08-12 MED ORDER — SIMVASTATIN 40 MG PO TABS: 40.0000 mg | ORAL_TABLET | Freq: Every day | ORAL | 3 refills | Status: AC

## 2024-08-12 MED ORDER — LEVOTHYROXINE SODIUM 137 MCG PO TABS: 137.0000 ug | ORAL_TABLET | Freq: Every day | ORAL | 0 refills | Status: AC

## 2024-08-12 NOTE — Progress Notes (Signed)
 "   New Patient Visit   Physician: Haiden Rawlinson A Lonzy Mato, MD  Patient: Joshua [name]   DOB: 06/03/1956   69 y.o. Male  MRN: [id number] Visit Date: 08/12/2024   Chief Complaint  Patient presents with   Establish Care   Subjective  Joshua [name] is a 69 y.o. male who presents today as a new patient to establish care.    He has a history of peripheral vascular disease for which he remains on Plavix  and aspirin .  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  He is remaining on Keppra .  Patient remaining on durvalumab  immunotherapy.  He does see hematology oncology on a regular basis.  History of prediabetes last known hemoglobin A1c of 6.3.  Medical history significant otherwise for COPD.  He did have imaging of the lungs done last in December 11.  Chest CT CT showing some aortic atherosclerosis with coronary artery calcification he has some consolidation and fibrosis of the lungs with mild underlying centrilobular emphysema.  Patient had an MRI of the brain in on the second.  He reports that there was some improvement in previous appearance he does have multiple enhancing lesions.  Patient with a past diagnosis of obstructive 8 obstructive sleep apnea it is not clear that he is using his CPAP.  He does live alone he does cook for himself.  Reports diet is overall healthy sometimes he finds it difficult to cook for himself.  Patient otherwise has a history of iron deficiency anemia history of nephrolithiasis.  He does have significant swelling left submandibular which she reports was evaluated by ENT with ultrasound imaging.  This ultrasound was done yesterday and we are waiting on the report.  This is a new finding.  Patient has had the influenza vaccine he has not had the pneumonia vaccine in the past.  Imaging pending for repeat MRI of the brain  as well as CT abdomen pelvis.   Weight loss and nutritional status - Significant weight loss from 220 lbs to 183 lbs - Attributed to poor appetite and reduced physical activity - Appetite is variable - Occasional use of stool softeners  Hyperlipidemia - On simvastatin  for cholesterol management  Functional status and lifestyle - Lives alone and manages own cooking - Sometimes lacks energy for meal preparation - Quit smoking in 2016 after 45 years of tobacco use - Good sleep quality and mood stability despite living alone and multiple health issues    Patient seen to establish care.  ASSESSMENT & PLAN  Encounter Diagnoses  Name Primary?   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  To continue with immunotherapy and ongoing follow-up with hematology oncology.  Should have pneumonia vaccine  today.  Will follow-up on results of MRI and repeat imaging.  2.  COPD.  He does have nebulizer at home but does not use inhalers on a regular basis.  Reporting that overall shortness of breath is improved he does see pulmonology.  3.  Peripheral vascular disease managed with clopidogrel  he does have a history of vascular stent placement.  Continue with simvastatin .  4.  Peripheral neuropathy.  Reported as manageable for now.  5.  History of seizures post brain metastatic this.  Patient to remain  on Keppra .  Will check basic labs including vitamin D  and iron.  He does have labs in coming with hematology oncology and so we will add on to those.  This is a complex patient with significant comorbid conditions.  Advised to follow-up early in case of illness.    This is a limited examination today due to general time constraints and patient complexity which exceeds visit time   Objective  BP 122/78 (BP Location: Left Arm, Patient Position: Sitting, Cuff Size: Normal)   Pulse 78   Ht 5' 11 (1.803 m)   Wt 183 lb 3.2 oz (83.1 kg)   SpO2 98%   BMI 25.55 kg/m      Review of Systems  Constitutional:  Negative for chills, fever and weight loss.  Eyes:  Negative for blurred vision. h Respiratory:  Negative for cough and shortness of breath.   Cardiovascular:  Negative for chest pain and palpitations.  Skin:  Negative for rash.  Psychiatric/Behavioral:  Negative for depression. The patient is not nervous/anxious.      Physical Exam Physical Exam Vitals reviewed.  Constitutional:      Appearance: Normal appearance. Well-developed with normal weight.  HENT:     Head: Normocephalic and atraumatic.  Normal mucous membranes, no oral lesions Eyes:     Pupils: Pupils are equal, round, and reactive to light.  Neck:     Thyroid : No thyroid  mass or thyromegaly.  Cardiovascular:     Rate and Rhythm: Normal rate and regular rhythm. Normal heart sounds. Normal peripheral pulses Pulmonary:     Normal breath sounds with normal effort Abdominal:   Abdomen is soft, without tenderness or noted hepatosplenomegaly Musculoskeletal:        General: No swelling or edema  Lymphadenopathy:     Cervical: No cervical adenopathy.  Mass 3x4 inches left submandibular Skin:    General: Skin is warm and dry without noticeable rash. Neurological:     General: No focal deficit present.  Psychiatric:        Mood and Affect: Mood, behavior and cognition normal   Past Medical History:  Diagnosis Date    Allergy    Cancer (HCC)    lung   COPD (chronic obstructive pulmonary disease) (HCC)    Dyspnea    former smoker. 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   Malignant neoplasm metastatic to brain [facility])    Nausea with vomiting 02/01/2024   Peripheral vascular disease    Pre-diabetes    Seizures (HCC) 08/20/23   Sleep apnea    Past Surgical History:  Procedure Laterality Date   ANTERIOR CERVICAL DECOMP/DISCECTOMY FUSION N/A 03/14/2020   Procedure: ANTERIOR CERVICAL DECOMPRESSION/DISCECTOMY FUSION 3 LEVELS C4-7;  Surgeon: Clois Fret, MD;  Location: [facility] ORS;  Service: Neurosurgery;  Laterality: N/A;   BACK SURGERY  2011   CENTRAL LINE INSERTION Right 09/10/2016   Procedure: CENTRAL  LINE INSERTION;  Surgeon: Jama Cordella MATSU, MD;  Location: [facility] ORS;  Service: Vascular;  Laterality: Right;   CERVICAL FUSION     C4-c7 on Mar 14, 2020   COLONOSCOPY  2013 ?   COLONOSCOPY N/A 09/09/2021   Procedure: COLONOSCOPY;  Surgeon: Onita Elspeth Sharper, DO;  Location: [facility] ENDOSCOPY;  Service: Gastroenterology;  Laterality: N/A;   CORONARY ANGIOPLASTY     ESOPHAGOGASTRODUODENOSCOPY N/A 09/09/2021   Procedure: ESOPHAGOGASTRODUODENOSCOPY (EGD);  Surgeon: Onita Elspeth Sharper, DO;  Location: [facility] ENDOSCOPY;  Service: Gastroenterology;  Laterality: N/A;   FEMORAL-POPLITEAL BYPASS GRAFT Right 09/10/2016   Procedure: BYPASS GRAFT FEMORAL-POPLITEAL ARTERY ( BELOW KNEE );  Surgeon: Jama Cordella MATSU, MD;  Location: [facility] ORS;  Service: Vascular;  Laterality: Right;   FEMORAL-TIBIAL BYPASS GRAFT Right 03/23/2020   Procedure: BYPASS GRAFT RIGHT FEMORAL- DISTAL TIBIAL ARTERY WITH DISTAL FLOW GRAFT;  Surgeon: Jama Cordella MATSU, MD;  Location: [facility] ORS;  Service: Vascular;  Laterality: Right;   LEFT HEART CATH AND CORONARY ANGIOGRAPHY Left 05/16/2021   Procedure: LEFT HEART CATH AND CORONARY ANGIOGRAPHY;  Surgeon: Lawyer Bernardino Cough, MD;   Location: [facility] INVASIVE CV LAB;  Service: Cardiovascular;  Laterality: Left;   LOWER EXTREMITY ANGIOGRAPHY Right 11/18/2016   Procedure: Lower Extremity Angiography;  Surgeon: Cordella MATSU Jama, MD;  Location: [facility] INVASIVE CV LAB;  Service: Cardiovascular;  Laterality: Right;   LOWER EXTREMITY ANGIOGRAPHY Right 12/09/2016   Procedure: Lower Extremity Angiography;  Surgeon: Jama Cordella MATSU, MD;  Location: [facility] INVASIVE CV LAB;  Service: Cardiovascular;  Laterality: Right;   LOWER EXTREMITY ANGIOGRAPHY Right 11/15/2019   Procedure: LOWER EXTREMITY ANGIOGRAPHY;  Surgeon: Jama Cordella MATSU, MD;  Location: [facility] INVASIVE CV LAB;  Service: Cardiovascular;  Laterality: Right;   LOWER EXTREMITY ANGIOGRAPHY Right 12/01/2019   Procedure: Lower Extremity Angiography;  Surgeon: Marea Selinda RAMAN, MD;  Location: [facility] INVASIVE CV LAB;  Service: Cardiovascular;  Laterality: Right;   LOWER EXTREMITY ANGIOGRAPHY Right 12/02/2019   Procedure: LOWER EXTREMITY ANGIOGRAPHY;  Surgeon: Jama Cordella MATSU, MD;  Location: [facility] INVASIVE CV LAB;  Service: Cardiovascular;  Laterality: Right;   LOWER EXTREMITY ANGIOGRAPHY Right 03/23/2020   Procedure: Lower Extremity Angiography;  Surgeon: Jama Cordella MATSU, MD;  Location: [facility] INVASIVE CV LAB;  Service: Cardiovascular;  Laterality: Right;   LOWER EXTREMITY ANGIOGRAPHY Right 09/25/2021   Procedure: Lower Extremity Angiography;  Surgeon: Jama Cordella MATSU, MD;  Location: [facility] INVASIVE CV LAB;  Service: Cardiovascular;  Laterality: Right;   LOWER EXTREMITY ANGIOGRAPHY Right 09/26/2021   Procedure: Lower Extremity Angiography;  Surgeon: Marea Selinda RAMAN, MD;  Location: [facility] INVASIVE CV LAB;  Service: Cardiovascular;  Laterality: Right;   LOWER EXTREMITY ANGIOGRAPHY Right 04/08/2022   Procedure: Lower Extremity Angiography;  Surgeon: Jama Cordella MATSU, MD;  Location: [facility] INVASIVE CV LAB;  Service: Cardiovascular;  Laterality: Right;   LOWER EXTREMITY ANGIOGRAPHY Right 03/04/2024   Procedure:  Lower Extremity Angiography;  Surgeon: Jama Cordella MATSU, MD;  Location: [facility] INVASIVE CV LAB;  Service: Cardiovascular;  Laterality: Right;   LOWER EXTREMITY ANGIOGRAPHY Right 03/25/2024   Procedure: Lower Extremity Angiography;  Surgeon: Jama Cordella MATSU, MD;  Location: [facility] INVASIVE CV LAB;  Service: Cardiovascular;  Laterality: Right;   LOWER EXTREMITY INTERVENTION  11/18/2016   Procedure: Lower Extremity Intervention;  Surgeon: Cordella MATSU Jama, MD;  Location: [facility] INVASIVE CV LAB;  Service: Cardiovascular;;   LOWER EXTREMITY INTERVENTION Right 03/04/2024   Procedure: LOWER EXTREMITY INTERVENTION;  Surgeon: Jama Cordella MATSU, MD;  Location: [facility] INVASIVE CV LAB;  Service:  Cardiovascular;  Laterality: Right;   PERIPHERAL VASCULAR CATHETERIZATION Right 01/02/2015   Procedure: Lower Extremity Angiography;  Surgeon: Cordella KANDICE Shawl, MD;  Location: [facility] INVASIVE CV LAB;  Service: Cardiovascular;  Laterality: Right;   PERIPHERAL VASCULAR CATHETERIZATION Right 06/18/2015   Procedure: Lower Extremity Angiography;  Surgeon: Selinda GORMAN Gu, MD;  Location: [facility] INVASIVE CV LAB;  Service: Cardiovascular;  Laterality: Right;   PERIPHERAL VASCULAR CATHETERIZATION  06/18/2015   Procedure: Lower Extremity Intervention;  Surgeon: Selinda GORMAN Gu, MD;  Location: [facility] INVASIVE CV LAB;  Service: Cardiovascular;;   PERIPHERAL VASCULAR CATHETERIZATION N/A 10/09/2015   Procedure: Abdominal Aortogram w/Lower Extremity;  Surgeon: Cordella KANDICE Shawl, MD;  Location: [facility] INVASIVE CV LAB;  Service: Cardiovascular;  Laterality: N/A;   PERIPHERAL VASCULAR CATHETERIZATION  10/09/2015   Procedure: Lower Extremity Intervention;  Surgeon: Cordella KANDICE Shawl, MD;  Location: [facility] INV[facility] LAB;  Service: Cardiovascular;;   PERIPHERAL VASCULAR CATHETERIZATION Right 10/10/2015   Procedure: Lower Extremity Angiography;  Surgeon: Selinda GORMAN Gu, MD;  Location: [facility] INVASIVE CV LAB;  Service: Cardiovascular;  Laterality: Right;   PERIPHERAL  VASCULAR CATHETERIZATION Right 10/10/2015   Procedure: Lower Extremity Intervention;  Surgeon: Selinda GORMAN Gu, MD;  Location: [facility] IN[facility] LAB;  Service: Cardiovascular;  Laterality: Right;   PERIPHERAL VASCULAR CATHETERIZATION Right 12/03/2015   Procedure: Lower Extremity Angiography;  Surgeon: Selinda GORMAN Gu, MD;  Location: [facility] IN[facility] LAB;  Service: Cardiovascular;  Laterality: Right;   PERIPHERAL VASCULAR CATHETERIZATION Right 12/04/2015   Procedure: Lower Extremity Angiography;  Surgeon: Selinda GORMAN Gu, MD;  Location: [facility];  Service: Cardiovascular;  Laterality: Right;   PERIPHERAL VASCULAR CATHETERIZATION  12/04/2015   Procedure: Lower Extremity Intervention;  Surgeon: Selinda GORMAN Gu, MD;  Location: [facility] INV[facility] LAB;  Service: Cardiovascular;;   PERIPHERAL VASCULAR CATHETERIZATION Right 06/30/2016   Procedure: Lower Extremity Angiography;  Surgeon: Selinda GORMAN Gu, MD;  Location: [facility] INV[facility] CV LAB;  Service: Cardiovascular;  Laterality: Right;   PERIPHERAL VASCULAR CATHETERIZATION Right 06/25/2016   Procedure: Lower Extremity Angiography;  Surgeon: Cordella KANDICE Shawl, MD;  Location: [facility];  Service: Cardiovascular;  Laterality: Right;   PERIPHERAL VASCULAR CATHETERIZATION Right 07/01/2016   Procedure: Lower Extremity Angiography;  Surgeon: Cordella KANDICE Shawl, MD;  Location: [facility] IN[facility] LAB;  Service: Cardiovascular;  Laterality: Right;   PERIPHERAL VASCULAR CATHETERIZATION  07/01/2016   Procedure: Lower Extremity Intervention;  Surgeon: Cordella KANDICE Shawl, MD;  Location: [facility] INV[facility]IVE CV LAB;  Service: Cardiovascular;;   PERIPHERAL VASCULAR CATHETERIZATION Right 08/01/2016   Procedure: Lower Extremity Angiography;  Surgeon: Cordella KANDICE Shawl, MD;  Location: [facility] INV[facility]IVE CV LAB;  Service: Cardiovascular;  Laterality: Right;   PORTA CATH INSERTION N/A 10/05/2020   Procedure: PORTA CATH INSERTION;  Surgeon: Shawl Cordella KANDICE, MD;  Location: [facility] INVASIVE CV LAB;   Service: Cardiovascular;  Laterality: N/A;   stent placement in right leg Right    VIDEO BRONCHOSCOPY N/A 09/14/2020   Procedure: VIDEO BRONCHOSCOPY WITH FLUORO;  Surgeon: Tamea Dedra CROME, MD;  Location: [facility] OR[facility];  Service: Cardiopulmonary;  Laterality: N/A;   VIDEO BRONCHOSCOPY WITH ENDOBRONCHIAL ULTRASOUND N/A 09/14/2020   Procedure: VIDEO BRONCHOSCOPY WITH ENDOBRONCHIAL ULTRASOUND;  Surgeon: Tamea Dedra CROME, MD;  Location: [facility] ORS;  Service: Cardiopulmonary;  Laterality: N/A;   Family Status  Relation Name Status   Mother Decreased Deceased   Father  Deceased   MGM  Deceased   Neg Hx  (Not Specified)  No partnership data on file  Family History  Problem Relation Age of Onset   Diabetes Mother    Hypertension Mother    Heart murmur Mother    Leukemia Mother    Throat cancer Maternal Grandmother    Colon cancer Neg Hx    Breast cancer Neg Hx    Social History   Socioeconomic History   Marital status: Single    Spouse name: Not on file   Number of children: Not on file   Years of education: Not on file   Highest education level: Not on file  Occupational History   Occupation: unemployed  Tobacco Use   Smoking status: Former    Current packs/day: 0.00    Average packs/day: 2.0 packs/day for 38.0 years (76.0 ttl pk-yrs)    Types: Cigarettes    Start date: 12/19/1976    Quit date: 12/20/2014    Years since quitting: 9.6   Smokeless tobacco: Former   Tobacco comments:    quit smoking in 2017 most smoked 2   Vaping Use   Vaping status: Never Used  Substance and Sexual Activity   Alcohol  use: Not Currently   Drug use: No   Sexual activity: Yes  Other Topics Concern   Not on file  Social History Narrative   Not on file   Social Drivers of Health   Tobacco Use: Medium Risk (08/12/2024)   Patient History    Smoking Tobacco Use: Former    Smokeless Tobacco Use: Former    Passive Exposure: Not on Actuary Strain: Low Risk (08/05/2024)   Overall  Financial Resource Strain (CARDIA)    Difficulty of Paying Living Expenses: Not hard at all  Food Insecurity: No Food Insecurity (08/05/2024)   Epic    Worried About Programme Researcher, Broadcasting/film/video in the Last Year: Never true    Ran Out of Food in the Last Year: Never true  Transportation Needs: No Transportation Needs (08/05/2024)   Epic    Lack of Transportation (Medical): No    Lack of Transportation (Non-Medical): No  Physical Activity: Inactive (08/05/2024)   Exercise Vital Sign    Days of Exercise per Week: 1 day    Minutes of Exercise per Session: 0 min  Stress: No Stress Concern Present (08/05/2024)   [employer] of Occupational Health - Occupational Stress Questionnaire    Feeling of Stress: Not at all  Social Connections: Socially Isolated (08/05/2024)   Social Connection and Isolation Panel    Frequency of Communication with Friends and Family: Three times a week    Frequency of Social Gatherings with Friends and Family: Once a week    Attends Religious Services: Never    Database Administrator or Organizations: No    Attends Engineer, Structural: Not on file    Marital Status: Never married  Depression (PHQ2-9): Low Risk (08/10/2024)   Depression (PHQ2-9)    PHQ-2 Score: 0  Alcohol  Screen: Not on file  Housing: Low Risk (08/05/2024)   Epic    Unable to Pay for Housing in the Last Year: No    Number of Times Moved in the Last Year: 0    Homeless in the Last Year: No  Utilities: Not At Risk (03/25/2024)   Epic    Threatened with loss of utilities: No  Health Literacy: Not on file   Show/hide medication list[1] Allergies[2]  Immunization History  Administered Date(s) Administered   INFLUENZA, HIGH DOSE SEASONAL PF 04/12/2024   Influenza,inj,Quad PF,6+ Mos 09/14/2016, 04/13/2019,  04/16/2020   Influenza-Unspecified 09/14/2016, 04/13/2019, 04/16/2020, 04/02/2022, 03/25/2023   PFIZER(Purple Top)SARS-COV-2 Vaccination 10/20/2019, 11/29/2019   Tdap 04/16/2020     Health Maintenance  Topic Date Due   Pneumococcal Vaccine: 50+ Years (1 of 2 - PCV) Never done   Zoster Vaccines- Shingrix (1 of 2) Never done   Medicare Annual Wellness (AWV)  10/13/2020   COVID-19 Vaccine (3 - Pfizer risk series) 08/28/2024 (Originally 12/27/2019)   DTaP/Tdap/Td (2 - Td or Tdap) 04/16/2030   Colonoscopy  09/10/2031   Influenza Vaccine  Completed   Hepatitis C Screening  Completed   Meningococcal B Vaccine  Aged Out    Patient Care Team: Everlene Parris LABOR, MD as PCP - General (Family Medicine) Marikay Eva POUR, PA as Referring Physician (Physician Assistant) Dessa, Reyes ORN, MD (General Surgery) Verdene Gills, RN as Oncology Nurse Navigator [name], MD as Consulting Physician (Oncology)  Depression Screen    08/10/2024    2:00 PM 07/25/2024   10:00 AM 06/06/2024   10:11 AM 05/23/2024    9:00 AM  PHQ 2/9 Scores  PHQ - 2 Score 0 0 0 0     Parris LABOR Everlene, MD  [facility] [phone] (phone) [phone] (fax)  [facility]    [1]  Outpatient Medications Prior to Visit  Medication Sig   aspirin  EC 81 MG tablet Take 81 mg by mouth daily. Swallow whole.   clopidogrel  (PLAVIX ) 75 MG tablet Take 1 tablet (75 mg total) by mouth daily.   Cyanocobalamin (B-12) 1000 MCG CHEW Chew 1 tablet by mouth daily at 6 (six) AM.   Ferrous Sulfate 50 MG TBCR Take 1 tablet by mouth daily at 6 (six) AM.   levETIRAcetam  (KEPPRA ) 1000 MG tablet Take 1 tablet (1,000 mg total) by mouth 2 (two) times daily.   levothyroxine  (SYNTHROID ) 125 MCG tablet Take 1 tablet (125 mcg total) by mouth daily before breakfast.   potassium chloride  SA (KLOR-CON  M) 20 MEQ tablet Take 1 tablet (20 mEq total) by mouth daily.   [DISCONTINUED] simvastatin  (ZOCOR ) 40 MG tablet Take 1 tablet (40 mg total) by mouth daily.   Facility-Administered Medications Prior to Visit  Medication Dose Route Frequency Provider   albuterol  (PROVENTIL ) (2.5  MG/3ML) 0.083% nebulizer solution 2.5 mg  2.5 mg Nebulization Once Tamea Dedra CROME, MD   heparin  lock flush 100 unit/mL  500 Units Intracatheter Once PRN [name], MD  [2] No Known Allergies  "

## 2024-08-15 ENCOUNTER — Other Ambulatory Visit: Payer: Self-pay

## 2024-08-15 ENCOUNTER — Other Ambulatory Visit (HOSPITAL_COMMUNITY): Payer: Self-pay

## 2024-08-15 ENCOUNTER — Encounter: Payer: Self-pay | Admitting: Oncology

## 2024-08-16 ENCOUNTER — Other Ambulatory Visit: Payer: Self-pay

## 2024-08-16 ENCOUNTER — Other Ambulatory Visit (HOSPITAL_COMMUNITY): Payer: Self-pay

## 2024-08-16 MED ORDER — POTASSIUM CHLORIDE CRYS ER 20 MEQ PO TBCR: 20.0000 meq | EXTENDED_RELEASE_TABLET | Freq: Every day | ORAL | 0 refills | Status: AC

## 2024-08-17 ENCOUNTER — Telehealth: Payer: Self-pay

## 2024-08-17 NOTE — Telephone Encounter (Signed)
 Copied from CRM #[id number]. Topic: Appointments - Scheduling Inquiry for Clinic >> Aug 17, 2024 10:31 AM Travis F wrote: Reason for CRM: Patient is calling in because he got a call that he needed to reschedule his appointment on 09/12/24 because the provider is leaving. Patient wanted to schedule with Dr. MARLA. Attempted to schedule with Dr. MARLA but Epic gave error message. Please advise.

## 2024-08-17 NOTE — Progress Notes (Signed)
 Joshua Cornet, MD sent to Joshua [name] for US  guided core biopsy of 2 left parotid / submandibular lesions.  Already had 2 inconclusive biopsies to the parotid lesion.  Would try to do both lesions but definitely target the new submandibular component.    Local anesthesia only unless patient requests.  [place]enn       Previous Messages    ----- Message ----- From: Joshua Clarita GORMAN Sent: 08/17/2024  10:26 AM EST To: Ir Procedure Requests Subject: US  Biopsy                                      IR Approval Request:   Procedure:    US  guided core LT Parotid gland lesion biopsy x 2  Reason:        Two hypoechoic mass lesions within the left parotid gland  History:         US  soft tissue head & neck  08/11/24  Provider:      Dr Chinita Hasten, MD  Provider Contact #     ENT  [phone]

## 2024-08-18 ENCOUNTER — Telehealth: Payer: Self-pay | Admitting: Oncology

## 2024-08-18 NOTE — Telephone Encounter (Signed)
 Patient called and lvm wanting to reschedule his treatments he has scheduled for Monday morning. Please advise

## 2024-08-18 NOTE — Telephone Encounter (Signed)
 Due to weather pt wants to r/s tx from 2/2 to 2/9. Dr.Yu can you update the dates on the IS orders please. New date/time confirmed with pt

## 2024-08-19 ENCOUNTER — Other Ambulatory Visit: Payer: Self-pay | Admitting: Oncology

## 2024-08-19 ENCOUNTER — Other Ambulatory Visit: Payer: Self-pay | Admitting: Unknown Physician Specialty

## 2024-08-19 DIAGNOSIS — K118 Other diseases of salivary glands: Secondary | ICD-10-CM

## 2024-08-22 ENCOUNTER — Inpatient Hospital Stay: Payer: Medicare (Managed Care)

## 2024-08-22 ENCOUNTER — Inpatient Hospital Stay: Payer: Medicare (Managed Care) | Admitting: Oncology

## 2024-08-24 ENCOUNTER — Other Ambulatory Visit: Payer: Medicare (Managed Care)

## 2024-08-24 ENCOUNTER — Encounter: Payer: Self-pay | Admitting: Oncology

## 2024-08-25 ENCOUNTER — Other Ambulatory Visit (INDEPENDENT_AMBULATORY_CARE_PROVIDER_SITE_OTHER): Payer: Self-pay

## 2024-08-25 ENCOUNTER — Encounter: Payer: Self-pay | Admitting: Oncology

## 2024-08-25 ENCOUNTER — Telehealth: Payer: Self-pay

## 2024-08-25 ENCOUNTER — Ambulatory Visit: Payer: Self-pay | Admitting: Internal Medicine

## 2024-08-25 LAB — COMPREHENSIVE METABOLIC PANEL WITH GFR
AG Ratio: 1.7 (calc) (ref 1.0–2.5)
ALT: 20 U/L (ref 9–46)
AST: 16 U/L (ref 10–35)
Albumin: 4.2 g/dL (ref 3.6–5.1)
Alkaline phosphatase (APISO): 79 U/L (ref 35–144)
BUN: 10 mg/dL (ref 7–25)
CO2: 29 mmol/L (ref 20–32)
Calcium: 9.1 mg/dL (ref 8.6–10.3)
Chloride: 100 mmol/L (ref 98–110)
Creat: 0.94 mg/dL (ref 0.70–1.35)
Globulin: 2.5 g/dL (ref 1.9–3.7)
Glucose, Bld: 98 mg/dL (ref 65–99)
Potassium: 4.3 mmol/L (ref 3.5–5.3)
Sodium: 138 mmol/L (ref 135–146)
Total Bilirubin: 0.4 mg/dL (ref 0.2–1.2)
Total Protein: 6.7 g/dL (ref 6.1–8.1)
eGFR: 89 mL/min/{1.73_m2}

## 2024-08-25 LAB — URINALYSIS, ROUTINE W REFLEX MICROSCOPIC
Bilirubin Urine: NEGATIVE
Glucose, UA: NEGATIVE
Hgb urine dipstick: NEGATIVE
Ketones, ur: NEGATIVE
Leukocytes,Ua: NEGATIVE
Nitrite: NEGATIVE
Protein, ur: NEGATIVE
Specific Gravity, Urine: 1.012 (ref 1.001–1.035)
pH: 5.5 (ref 5.0–8.0)

## 2024-08-25 LAB — CBC WITH DIFFERENTIAL/PLATELET
Absolute Lymphocytes: 1475 {cells}/uL (ref 850–3900)
Absolute Monocytes: 635 {cells}/uL (ref 200–950)
Basophils Absolute: 37 {cells}/uL (ref 0–200)
Basophils Relative: 0.5 %
Eosinophils Absolute: 358 {cells}/uL (ref 15–500)
Eosinophils Relative: 4.9 %
HCT: 45.9 % (ref 39.4–51.1)
Hemoglobin: 13.8 g/dL (ref 13.2–17.1)
MCH: 24 pg — ABNORMAL LOW (ref 27.0–33.0)
MCHC: 30.1 g/dL — ABNORMAL LOW (ref 31.6–35.4)
MCV: 80 fL — ABNORMAL LOW (ref 81.4–101.7)
MPV: 11.9 fL (ref 7.5–12.5)
Monocytes Relative: 8.7 %
Neutro Abs: 4796 {cells}/uL (ref 1500–7800)
Neutrophils Relative %: 65.7 %
Platelets: 200 10*3/uL (ref 140–400)
RBC: 5.74 Million/uL (ref 4.20–5.80)
RDW: 19 % — ABNORMAL HIGH (ref 11.0–15.0)
Total Lymphocyte: 20.2 %
WBC: 7.3 10*3/uL (ref 3.8–10.8)

## 2024-08-25 LAB — HEMOGLOBIN A1C
Hgb A1c MFr Bld: 5.8 % — ABNORMAL HIGH
Mean Plasma Glucose: 120 mg/dL
eAG (mmol/L): 6.6 mmol/L

## 2024-08-25 LAB — IRON,TIBC AND FERRITIN PANEL
%SAT: 33 % (ref 20–48)
Ferritin: 20 ng/mL — ABNORMAL LOW (ref 24–380)
Iron: 121 ug/dL (ref 50–180)
TIBC: 367 ug/dL (ref 250–425)

## 2024-08-25 LAB — LIPID PANEL
Cholesterol: 144 mg/dL
HDL: 58 mg/dL
LDL Cholesterol (Calc): 68 mg/dL
Non-HDL Cholesterol (Calc): 86 mg/dL
Total CHOL/HDL Ratio: 2.5 (calc)
Triglycerides: 92 mg/dL

## 2024-08-25 LAB — VITAMIN D 25 HYDROXY (VIT D DEFICIENCY, FRACTURES): Vit D, 25-Hydroxy: 40 ng/mL (ref 30–100)

## 2024-08-25 LAB — T4, FREE: Free T4: 1.5 ng/dL (ref 0.8–1.8)

## 2024-08-25 LAB — TSH+FREE T4: TSH W/REFLEX TO FT4: 31.06 m[IU]/L — ABNORMAL HIGH (ref 0.40–4.50)

## 2024-08-25 MED ORDER — CLOPIDOGREL BISULFATE 75 MG PO TABS: 75.0000 mg | ORAL_TABLET | Freq: Every day | ORAL | 3 refills | Status: AC

## 2024-08-25 NOTE — Telephone Encounter (Signed)
 Patient for US  guided core LT Parotid lesion biopsies on Friday 08/26/24, I called and spoke with the patient on the phone and gave pre-procedure instructions. Pt was made aware to be here at 12:30p and check in at the [facility] registration desk. Pt stated understanding. Called 08/25/24

## 2024-08-26 ENCOUNTER — Other Ambulatory Visit: Payer: Self-pay | Admitting: Unknown Physician Specialty

## 2024-08-26 ENCOUNTER — Ambulatory Visit: Admission: RE | Admit: 2024-08-26 | Payer: Medicare (Managed Care)

## 2024-08-26 DIAGNOSIS — K118 Other diseases of salivary glands: Secondary | ICD-10-CM

## 2024-08-26 LAB — BODY FLUID CULTURE W GRAM STAIN: Gram Stain: NONE SEEN

## 2024-08-26 MED ORDER — LIDOCAINE HCL (PF) 1 % IJ SOLN
10.0000 mL | Freq: Once | INTRAMUSCULAR | Status: AC
  Administered 2024-08-26: 10 mL via INTRADERMAL

## 2024-08-26 NOTE — Procedures (Signed)
 Interventional Radiology Procedure:   Indications: Left parotid nodule and new submandibular component  Procedure: US  guided core biopsy of 2 left neck lesions  Findings: Core biopsies of hypoechoic left parotid lesion.  Thick material / fluid was aspirated from this lesion after the core biopsies.  Core biopsies obtained from irregular left submandibular material adjacent to the left parotid lesion.    Complications: None     EBL: Minimal  Plan: Discharge to home.  The aspirated material will be sent for culture.    Charvi Gammage R. Philip, MD  Pager: [phone]

## 2024-08-29 ENCOUNTER — Inpatient Hospital Stay: Payer: Medicare (Managed Care) | Admitting: Oncology

## 2024-08-29 ENCOUNTER — Inpatient Hospital Stay: Payer: Medicare (Managed Care)

## 2024-08-29 ENCOUNTER — Inpatient Hospital Stay: Payer: Medicare (Managed Care) | Attending: Oncology

## 2024-08-30 ENCOUNTER — Inpatient Hospital Stay: Payer: Medicare (Managed Care)

## 2024-09-05 ENCOUNTER — Inpatient Hospital Stay: Payer: Medicare (Managed Care)

## 2024-09-05 ENCOUNTER — Inpatient Hospital Stay: Payer: Medicare (Managed Care) | Admitting: Oncology

## 2024-09-12 ENCOUNTER — Ambulatory Visit: Payer: Medicare (Managed Care)

## 2024-09-12 ENCOUNTER — Inpatient Hospital Stay: Payer: Medicare (Managed Care) | Admitting: Oncology

## 2024-09-12 ENCOUNTER — Inpatient Hospital Stay: Payer: Medicare (Managed Care)

## 2024-09-12 ENCOUNTER — Ambulatory Visit: Payer: Medicare (Managed Care) | Admitting: Family Medicine

## 2024-10-20 ENCOUNTER — Encounter (INDEPENDENT_AMBULATORY_CARE_PROVIDER_SITE_OTHER)

## 2024-10-20 ENCOUNTER — Ambulatory Visit (INDEPENDENT_AMBULATORY_CARE_PROVIDER_SITE_OTHER): Admitting: Vascular Surgery

## 2024-10-24 ENCOUNTER — Inpatient Hospital Stay: Payer: Medicare (Managed Care)

## 2024-10-28 ENCOUNTER — Inpatient Hospital Stay: Payer: Medicare (Managed Care) | Admitting: Internal Medicine

## 2025-02-23 ENCOUNTER — Encounter: Payer: Medicare (Managed Care) | Admitting: Internal Medicine
